# Patient Record
Sex: Female | Born: 1935
Health system: Southern US, Community
[De-identification: ages and names within clinical notes are randomized; demographics above are authoritative.]

## PROBLEM LIST (undated history)

## (undated) DIAGNOSIS — I4891 Unspecified atrial fibrillation: Secondary | ICD-10-CM

## (undated) DIAGNOSIS — Z9889 Other specified postprocedural states: Secondary | ICD-10-CM

## (undated) DIAGNOSIS — N289 Disorder of kidney and ureter, unspecified: Secondary | ICD-10-CM

## (undated) DIAGNOSIS — N183 Chronic kidney disease, stage 3 unspecified: Secondary | ICD-10-CM

## (undated) DIAGNOSIS — I10 Essential (primary) hypertension: Secondary | ICD-10-CM

## (undated) DIAGNOSIS — D649 Anemia, unspecified: Secondary | ICD-10-CM

## (undated) DIAGNOSIS — E039 Hypothyroidism, unspecified: Secondary | ICD-10-CM

## (undated) DIAGNOSIS — R911 Solitary pulmonary nodule: Secondary | ICD-10-CM

## (undated) DIAGNOSIS — J189 Pneumonia, unspecified organism: Secondary | ICD-10-CM

## (undated) DIAGNOSIS — R42 Dizziness and giddiness: Secondary | ICD-10-CM

## (undated) DIAGNOSIS — I701 Atherosclerosis of renal artery: Secondary | ICD-10-CM

## (undated) DIAGNOSIS — R011 Cardiac murmur, unspecified: Secondary | ICD-10-CM

## (undated) DIAGNOSIS — R112 Nausea with vomiting, unspecified: Secondary | ICD-10-CM

## (undated) DIAGNOSIS — E785 Hyperlipidemia, unspecified: Secondary | ICD-10-CM

## (undated) DIAGNOSIS — J45909 Unspecified asthma, uncomplicated: Secondary | ICD-10-CM

## (undated) DIAGNOSIS — N2889 Other specified disorders of kidney and ureter: Secondary | ICD-10-CM

## (undated) DIAGNOSIS — M1990 Unspecified osteoarthritis, unspecified site: Secondary | ICD-10-CM

## (undated) DIAGNOSIS — J449 Chronic obstructive pulmonary disease, unspecified: Secondary | ICD-10-CM

## (undated) HISTORY — DX: Unspecified osteoarthritis, unspecified site: M19.90

## (undated) HISTORY — DX: Essential (primary) hypertension: I10

## (undated) HISTORY — DX: Unspecified asthma, uncomplicated: J45.909

## (undated) HISTORY — DX: Dizziness and giddiness: R42

## (undated) HISTORY — DX: Chronic kidney disease, stage 3 (moderate): N18.3

## (undated) HISTORY — DX: Disorder of kidney and ureter, unspecified: N28.9

## (undated) HISTORY — DX: Chronic kidney disease, stage 3 unspecified: N18.30

## (undated) HISTORY — DX: Hyperlipidemia, unspecified: E78.5

## (undated) HISTORY — DX: Other specified disorders of kidney and ureter: N28.89

## (undated) HISTORY — DX: Solitary pulmonary nodule: R91.1

## (undated) HISTORY — DX: Anemia, unspecified: D64.9

## (undated) HISTORY — DX: Hypothyroidism, unspecified: E03.9

## (undated) HISTORY — PX: KNEE ARTHROSCOPY: SUR90

---

## 1979-08-19 HISTORY — PX: THYROIDECTOMY, PARTIAL: SHX18

## 1998-05-31 ENCOUNTER — Other Ambulatory Visit: Admission: RE | Admit: 1998-05-31 | Discharge: 1998-05-31 | Payer: Self-pay | Admitting: Obstetrics and Gynecology

## 1999-07-17 ENCOUNTER — Other Ambulatory Visit: Admission: RE | Admit: 1999-07-17 | Discharge: 1999-07-17 | Payer: Self-pay | Admitting: Obstetrics and Gynecology

## 2000-06-24 ENCOUNTER — Other Ambulatory Visit: Admission: RE | Admit: 2000-06-24 | Discharge: 2000-06-24 | Payer: Self-pay | Admitting: Obstetrics and Gynecology

## 2001-06-04 ENCOUNTER — Emergency Department (HOSPITAL_COMMUNITY): Admission: EM | Admit: 2001-06-04 | Discharge: 2001-06-04 | Payer: Self-pay

## 2001-07-13 ENCOUNTER — Other Ambulatory Visit: Admission: RE | Admit: 2001-07-13 | Discharge: 2001-07-13 | Payer: Self-pay | Admitting: Obstetrics and Gynecology

## 2004-03-13 ENCOUNTER — Ambulatory Visit (HOSPITAL_COMMUNITY): Admission: RE | Admit: 2004-03-13 | Discharge: 2004-03-13 | Payer: Self-pay | Admitting: Gastroenterology

## 2005-12-10 ENCOUNTER — Ambulatory Visit: Payer: Self-pay | Admitting: Cardiology

## 2005-12-26 ENCOUNTER — Encounter: Payer: Self-pay | Admitting: Cardiovascular Disease

## 2005-12-26 ENCOUNTER — Ambulatory Visit: Payer: Self-pay

## 2006-01-01 ENCOUNTER — Ambulatory Visit: Payer: Self-pay | Admitting: Cardiology

## 2007-06-04 ENCOUNTER — Ambulatory Visit: Payer: Self-pay | Admitting: Internal Medicine

## 2007-06-04 ENCOUNTER — Inpatient Hospital Stay (HOSPITAL_COMMUNITY): Admission: EM | Admit: 2007-06-04 | Discharge: 2007-06-05 | Payer: Self-pay | Admitting: Emergency Medicine

## 2007-07-21 ENCOUNTER — Ambulatory Visit: Payer: Self-pay | Admitting: Cardiology

## 2007-08-23 ENCOUNTER — Ambulatory Visit: Payer: Self-pay

## 2007-08-31 ENCOUNTER — Ambulatory Visit: Payer: Self-pay | Admitting: Cardiovascular Disease

## 2007-11-27 ENCOUNTER — Inpatient Hospital Stay (HOSPITAL_COMMUNITY): Admission: EM | Admit: 2007-11-27 | Discharge: 2007-12-02 | Payer: Self-pay | Admitting: Emergency Medicine

## 2007-11-27 ENCOUNTER — Ambulatory Visit: Payer: Self-pay | Admitting: Cardiology

## 2007-11-29 ENCOUNTER — Encounter (INDEPENDENT_AMBULATORY_CARE_PROVIDER_SITE_OTHER): Payer: Self-pay | Admitting: *Deleted

## 2008-09-27 ENCOUNTER — Ambulatory Visit: Payer: Self-pay | Admitting: Cardiology

## 2008-09-28 DIAGNOSIS — C4491 Basal cell carcinoma of skin, unspecified: Secondary | ICD-10-CM

## 2008-09-28 HISTORY — DX: Basal cell carcinoma of skin, unspecified: C44.91

## 2009-07-30 ENCOUNTER — Encounter: Admission: RE | Admit: 2009-07-30 | Discharge: 2009-08-16 | Payer: Self-pay | Admitting: Podiatry

## 2009-08-20 ENCOUNTER — Encounter: Admission: RE | Admit: 2009-08-20 | Discharge: 2009-11-18 | Payer: Self-pay | Admitting: Podiatry

## 2009-11-29 ENCOUNTER — Encounter: Payer: Self-pay | Admitting: Cardiology

## 2009-12-18 DIAGNOSIS — M129 Arthropathy, unspecified: Secondary | ICD-10-CM | POA: Insufficient documentation

## 2009-12-18 DIAGNOSIS — E782 Mixed hyperlipidemia: Secondary | ICD-10-CM | POA: Insufficient documentation

## 2009-12-18 DIAGNOSIS — N189 Chronic kidney disease, unspecified: Secondary | ICD-10-CM | POA: Insufficient documentation

## 2009-12-18 DIAGNOSIS — E039 Hypothyroidism, unspecified: Secondary | ICD-10-CM | POA: Insufficient documentation

## 2009-12-18 DIAGNOSIS — I10 Essential (primary) hypertension: Secondary | ICD-10-CM

## 2009-12-18 DIAGNOSIS — E785 Hyperlipidemia, unspecified: Secondary | ICD-10-CM

## 2009-12-19 ENCOUNTER — Ambulatory Visit: Payer: Self-pay | Admitting: Cardiology

## 2009-12-19 DIAGNOSIS — R079 Chest pain, unspecified: Secondary | ICD-10-CM

## 2010-01-23 ENCOUNTER — Ambulatory Visit: Payer: Self-pay | Admitting: Cardiology

## 2010-01-23 DIAGNOSIS — R9431 Abnormal electrocardiogram [ECG] [EKG]: Secondary | ICD-10-CM

## 2010-01-25 ENCOUNTER — Ambulatory Visit: Payer: Self-pay | Admitting: Cardiology

## 2010-01-25 ENCOUNTER — Inpatient Hospital Stay (HOSPITAL_BASED_OUTPATIENT_CLINIC_OR_DEPARTMENT_OTHER): Admission: RE | Admit: 2010-01-25 | Discharge: 2010-01-25 | Payer: Self-pay | Admitting: Cardiology

## 2010-02-01 ENCOUNTER — Encounter: Payer: Self-pay | Admitting: Cardiology

## 2010-02-19 ENCOUNTER — Encounter: Payer: Self-pay | Admitting: Cardiology

## 2010-02-20 ENCOUNTER — Ambulatory Visit: Payer: Self-pay | Admitting: Cardiology

## 2010-09-08 ENCOUNTER — Encounter: Payer: Self-pay | Admitting: Cardiology

## 2010-09-17 NOTE — Letter (Signed)
Summary: Cardiac Catheterization Instructions- [facility]  [facility] at [facility]. [place], [place] [address]   Phone: [phone]  Fax: [phone]     01/23/2010 MRN: [id number]  [name] [place],   [address]  [name],   You are scheduled for a Cardiac Catheterization on Friday, June 10th with Dr. Percival Spanish Please arrive to the 1st floor of the Heart and Vascular Center at [facility] at 6:30 am on the day of your procedure. Please do not arrive before 6:30 a.m. Call the Heart and Vascular Center at [phone] if you are unable to make your appointmnet. The Code to get into the parking garage under the building is 0100. Take the elevators to the 1st floor. You must have someone to drive you home. Someone must be with you for the first 24 hours after you arrive home. Please wear clothes that are easy to get on and off and wear slip-on shoes. Do not eat or drink after midnight except water with your medications that morning. Bring all your medications and current insurance cards with you.  ___ DO NOT take these medications before your procedure: ________________________________________________________________  ___ Make sure you take your aspirin.  ___ You may take ALL of your medications with water that morning.     The usual length of stay after your procedure is 2 to 3 hours. This can vary.  If you have any questions, please call the office at the number listed above.   Sim Boast, RN

## 2010-09-17 NOTE — Assessment & Plan Note (Signed)
Summary: [facility]      Allergies Added:   Visit Type:  Follow-up Primary Provider:  Haynes Hoehn, NP  CC:  chest pain.  History of Present Illness: The patient returns after recent cardiac catheterization. This was done to evaluate chest pain and an abnormal stress test. However, she had normal coronaries on catheter. Since that time she has had no new problems. She gets the discomfort only when she ambulates up a hill. It goes away when she stops. She has had no new resting symptoms. She denies any excessive shortness of breath, PND or orthopnea. She has had no palpitations, presyncope or syncope. She had no problems with her right femoral access site for her catheterization.  Current Medications (verified): 1)  Atenolol-Chlorthalidone 50-25 Mg Tabs (Atenolol-Chlorthalidone) .Marland Kitchen.. 1 Po Daily 2)  Lipitor 10 Mg Tabs (Atorvastatin Calcium) .Marland Kitchen.. 1 By Mouth Daily 3)  Lisinopril 5 Mg Tabs (Lisinopril) .Marland Kitchen.. 1 By Mouth Daily 4)  Levothroid 75 Mcg Tabs (Levothyroxine Sodium) .Marland Kitchen.. 1 By Mouth Daily 5)  Oscal 500/200 D-3 500-200 Mg-Unit Tabs (Calcium-Vitamin D) .Marland Kitchen.. 1 By Mouth Daily 6)  Omega-3 350 Mg Caps (Omega-3 Fatty Acids) .Marland Kitchen.. 1 By Mouth Daily 7)  Aspirin 81 Mg  Tabs (Aspirin) .Marland Kitchen.. 1 By Mouth Daily  Allergies (verified): 1)  ! Sulfa  Past History:  Past Medical History: Reviewed history from 12/19/2009 and no changes required.  1. Right upper lobe nodule.   2. Hypertension.   3. Hyperlipidemia.   4. Hypothyroidism.   5. Chronic renal insufficiency.   6. Arthritis   Past Surgical History: Reviewed history from 12/18/2009 and no changes required.  Partial thyroidectomy  Review of Systems       As stated in the HPI and negative for all other systems.   Vital Signs:  Patient profile:   77 year old female Height:      66 inches Weight:      14918 pounds BMI:     2416.53 Pulse rate:   60 / minute Resp:     18 per minute BP sitting:   148 / 74  (right arm)  Vitals  Entered By: Levora Angel, CNA (February 20, 2010 3:24 PM)  Physical Exam  General:  Well developed, well nourished, in no acute distress. Head:  normocephalic and atraumatic Eyes:  PERRLA/EOM intact; conjunctiva and lids normal. Mouth:  Teeth, gums and palate normal. Oral mucosa normal. Neck:  Neck supple, no JVD. No masses, thyromegaly or abnormal cervical nodes. Chest Wall:  no deformities or breast masses noted Lungs:  Clear bilaterally to auscultation and percussion. Abdomen:  Bowel sounds positive; abdomen soft and non-tender without masses, organomegaly, or hernias noted. No hepatosplenomegaly. Msk:  Back normal, normal gait. Muscle strength and tone normal. Extremities:  No clubbing or cyanosis. Neurologic:  Alert and oriented x 3. Skin:  Intact without lesions or rashes. Psych:  Normal affect.   Detailed Cardiovascular Exam  Neck    Carotids: Carotids full and equal bilaterally without bruits.      Neck Veins: Normal, no JVD.    Heart    Inspection: no deformities or lifts noted.      Palpation: normal PMI with no thrills palpable.      Auscultation: regular rate and rhythm, S1, S2 without murmurs, rubs, gallops, or clicks.    Vascular    Abdominal Aorta: no palpable masses, pulsations, or audible bruits.      Femoral Pulses: normal femoral pulses bilaterally.  Pedal Pulses: normal pedal pulses bilaterally.      Radial Pulses: normal radial pulses bilaterally.      Peripheral Circulation: no clubbing, cyanosis, or edema noted with normal capillary refill.     EKG  Procedure date:  02/20/2010  Findings:      Sinus rhythm, rate 60, axis within normal limits, intervals within normal limits, sinus arrhythmia  Impression & Recommendations:  Problem # 1:  CHEST PAIN (ICD-786.50) There is no obvious cardiac etiology to her complaints. No further cardiac testing is suggested. If this persists perhaps evaluation for exercise-induced asthma might be prudent.  Problem #  2:  RENAL INSUFFICIENCY, CHRONIC (ICD-585.9) We followed up her creatinine post procedure and it remained stable.  Problem # 3:  HYPERTENSION (ICD-401.9) Her blood pressure is very slightly elevated here. However, she says it is normal at home and I will make no changes.

## 2010-09-17 NOTE — Miscellaneous (Signed)
  Clinical Lists Changes  Observations: Added new observation of CARDCATHFIND: 1. Hemodynamics:  LV 186/22, AO 187/123. 2. Coronaries:  The left main was normal.  The LAD was normal.  There     was a small first diagonal which was normal.  The circumflex in the     AV groove was normal.  Ramus intermediate was large and normal.     The right coronary artery was dominant and normal.  The PDA was     moderate sized and normal. 3. Left ventriculogram:  The left ventricle was not injected secondary     to renal insufficiency, though I did cross for pressures.   CONCLUSION:  Normal coronary arteries.  False positive exercise treadmill test.   PLAN:  No further cardiac workup is suggested.  She will continue with primary risk reduction.         Minus Breeding, MD, [facility]       (01/25/2010 9:54)      Cardiac Cath  Procedure date:  01/25/2010  Findings:      1. Hemodynamics:  LV 186/22, AO 187/123. 2. Coronaries:  The left main was normal.  The LAD was normal.  There     was a small first diagonal which was normal.  The circumflex in the     AV groove was normal.  Ramus intermediate was large and normal.     The right coronary artery was dominant and normal.  The PDA was     moderate sized and normal. 3. Left ventriculogram:  The left ventricle was not injected secondary     to renal insufficiency, though I did cross for pressures.   CONCLUSION:  Normal coronary arteries.  False positive exercise treadmill test.   PLAN:  No further cardiac workup is suggested.  She will continue with primary risk reduction.         Minus Breeding, MD, [facility]

## 2010-09-17 NOTE — Assessment & Plan Note (Signed)
Summary: [facility]  Medications Added ASPIRIN 81 MG  TABS (ASPIRIN) 1 by mouth daily      Allergies Added:   Visit Type:  Follow-up Primary Provider:  Haynes Hoehn, NP  CC:  chest pain.  History of Present Illness: The patient presents for followup of an abnormal stress test. I saw her a few weeks ago for evaluation of chest discomfort. This wasn't exertional discomfort. Because of was a stable pattern I decided to send her for an exercise treadmill test. The patient had this study but was not able to achieve a target heart rate. She got to a heart rate of 112 with a target of 124. She had to stop because she became fatigued and went 5 minutes and 15 seconds. Of note she did not hold her beta blocker prior to this test. She says she did not go for now to bring on her usual exertional discomfort. However, the stress test clearly demonstrated ST segment depression of 2 mm in leads 23 and aVF at peak exercise. She came back to discuss this. Since that study she has had no new symptoms. In particular she has had no resting symptoms such as chest pressure, neck or arm discomfort. She is not describing palpitations, presyncope or syncope. She is not describing PND or orthopnea.  Current Medications (verified): 1)  Atenolol-Chlorthalidone 50-25 Mg Tabs (Atenolol-Chlorthalidone) .Marland Kitchen.. 1 Po Daily 2)  Lipitor 10 Mg Tabs (Atorvastatin Calcium) .Marland Kitchen.. 1 By Mouth Daily 3)  Lisinopril 5 Mg Tabs (Lisinopril) .Marland Kitchen.. 1 By Mouth Daily 4)  Levothroid 75 Mcg Tabs (Levothyroxine Sodium) .Marland Kitchen.. 1 By Mouth Daily 5)  Oscal 500/200 D-3 500-200 Mg-Unit Tabs (Calcium-Vitamin D) .Marland Kitchen.. 1 By Mouth Daily 6)  Omega-3 350 Mg Caps (Omega-3 Fatty Acids) .Marland Kitchen.. 1 By Mouth Daily 7)  Aspirin 81 Mg  Tabs (Aspirin) .Marland Kitchen.. 1 By Mouth Daily  Allergies (verified): 1)  ! Sulfa  Past History:  Past Medical History: Reviewed history from 12/19/2009 and no changes required.  1. Right upper lobe nodule.   2. Hypertension.   3.  Hyperlipidemia.   4. Hypothyroidism.   5. Chronic renal insufficiency.   6. Arthritis   Past Surgical History: Reviewed history from 12/18/2009 and no changes required.  Partial thyroidectomy  Family History: Reviewed history from 12/19/2009 and no changes required. Mother died age 13.  She has history of thyroid disease.   Father had a CVA.   Social History: Reviewed history from 12/19/2009 and no changes required. Cigarettes the patient denies, alcohol the patient denies.  Review of Systems       As stated in the HPI and negative for all other systems.   Vital Signs:  Patient profile:   77 year old female Height:      66 inches Weight:      150 pounds BMI:     24.30 Pulse rate:   55 / minute Resp:     18 per minute BP sitting:   168 / 88  (right arm)  Vitals Entered By: Levora Angel, CNA (January 23, 2010 11:23 AM)  Physical Exam  General:  Well developed, well nourished, in no acute distress. Head:  normocephalic and atraumatic Eyes:  PERRLA/EOM intact; conjunctiva and lids normal. Mouth:  Teeth, gums and palate normal. Oral mucosa normal. Neck:  Neck supple, no JVD. No masses, thyromegaly or abnormal cervical nodes. Chest Wall:  no deformities or breast masses noted Lungs:  Clear bilaterally to auscultation and percussion. Abdomen:  Bowel sounds positive;  abdomen soft and non-tender without masses, organomegaly, or hernias noted. No hepatosplenomegaly. Msk:  Back normal, normal gait. Muscle strength and tone normal. Extremities:  No clubbing or cyanosis. Neurologic:  Alert and oriented x 3. Skin:  Intact without lesions or rashes. Cervical Nodes:  no significant adenopathy Inguinal Nodes:  no significant adenopathy Psych:  Normal affect.   Detailed Cardiovascular Exam  Neck    Carotids: Carotids full and equal bilaterally without bruits.      Neck Veins: Normal, no JVD.    Heart    Inspection: no deformities or lifts noted.      Palpation: normal PMI with  no thrills palpable.      Auscultation: regular rate and rhythm, S1, S2 without murmurs, rubs, gallops, or clicks.    Vascular    Abdominal Aorta: no palpable masses, pulsations, or audible bruits.      Femoral Pulses: normal femoral pulses bilaterally.      Pedal Pulses: normal pedal pulses bilaterally.      Radial Pulses: normal radial pulses bilaterally.      Peripheral Circulation: no clubbing, cyanosis, or edema noted with normal capillary refill.     Impression & Recommendations:  Problem # 1:  ABNORMAL STRESS ELECTROCARDIOGRAM (ICD-794.31) Patient now has an abnormal stress test with symptoms as described. The post test probability of obstructive coronary disease is high. Therefore, cardiac catheterization is indicated. I discussed at length the risks and benefits to include stroke heart attack and death as well as well as other possibilities. She understands and agrees to proceed.  Problem # 2:  RENAL INSUFFICIENCY, CHRONIC (ICD-585.9) Her last creatinine was 1.3 in April which is a creatinine clearance of about 45. She will hold her lisinopril for 2 days and orally hydrate. She'll come in for the study in the early morning and be hydrated for a late morning study. I will minimize contrast.  Problem # 3:  HYPERLIPIDEMIA (ICD-272.4) This is followed closely by her primary physicians. Further recommendations will be based on the presence or absence of coronary disease.  Patient Instructions: 1)  Your physician recommends that you schedule a follow-up appointment after cath in [place] office 2)  Your physician recommends that you have  lab work TODAY 3)  Your physician recommends that you continue on your current medications as directed. Please refer to the Current Medication list given to you today.

## 2010-09-17 NOTE — Assessment & Plan Note (Signed)
Summary: [facility]  Medications Added ATENOLOL-CHLORTHALIDONE 50-25 MG TABS (ATENOLOL-CHLORTHALIDONE) 1 po daily LIPITOR 10 MG TABS (ATORVASTATIN CALCIUM) 1 by mouth daily LISINOPRIL 5 MG TABS (LISINOPRIL) 1 by mouth daily LEVOTHROID 75 MCG TABS (LEVOTHYROXINE SODIUM) 1 by mouth daily OSCAL 500/200 D-3 500-200 MG-UNIT TABS (CALCIUM-VITAMIN D) 1 by mouth daily OMEGA-3 350 MG CAPS (OMEGA-3 FATTY ACIDS) 1 by mouth daily      Allergies Added: ! SULFA    Visit Type:  Follow-up Primary Provider:  Haynes Hoehn, NP  CC:  Dyspnea.  History of Present Illness: The patient presents for evaluation of dyspnea and chest discomfort. She was evaluated for this in 2007 and hospitalized in 2008. In 2007 she had a stress perfusion study which demonstrated a normal ejection fraction and no evidence of ischemia or infarct. No further evaluation was performed following this. Because of continued complaints as well as risk factors she was referred for further evaluation. She says similar 2007 she does get some shortness of breath climbing an incline and may also get some chest tightness. She stops what she is doing rests for a minute and her symptoms quickly go away. She says these have been stable and unchanged over the years. She has no resting chest pain and denies any neck or arm discomfort. She has no palpitations, presyncope or syncope. She has no PND or orthopnea.  Current Medications (verified): 1)  Atenolol-Chlorthalidone 50-25 Mg Tabs (Atenolol-Chlorthalidone) .Marland Kitchen.. 1 Po Daily 2)  Lipitor 10 Mg Tabs (Atorvastatin Calcium) .Marland Kitchen.. 1 By Mouth Daily 3)  Lisinopril 5 Mg Tabs (Lisinopril) .Marland Kitchen.. 1 By Mouth Daily 4)  Levothroid 75 Mcg Tabs (Levothyroxine Sodium) .Marland Kitchen.. 1 By Mouth Daily 5)  Oscal 500/200 D-3 500-200 Mg-Unit Tabs (Calcium-Vitamin D) .Marland Kitchen.. 1 By Mouth Daily 6)  Omega-3 350 Mg Caps (Omega-3 Fatty Acids) .Marland Kitchen.. 1 By Mouth Daily  Allergies (verified): 1)  ! Sulfa  Past History:  Past Medical  History:  1. Right upper lobe nodule.   2. Hypertension.   3. Hyperlipidemia.   4. Hypothyroidism.   5. Chronic renal insufficiency.   6. Arthritis   Family History: Mother died age 76.  She has history of thyroid disease.   Father had a CVA.   Social History: Reviewed history from 12/18/2009 and no changes required. Cigarettes the patient denies, alcohol the patient denies.  Review of Systems       Recent plantar fasciitis. Otherwise as stated in the history of present illness negative for all other systems.   Vital Signs:  Patient profile:   77 year old female Height:      66 inches Weight:      150 pounds BMI:     24.30 Pulse rate:   52 / minute Resp:     16 per minute BP sitting:   138 / 82  (right arm)  Vitals Entered By: Levora Angel, CNA (Dec 19, 2009 11:51 AM)  Physical Exam  General:  Well developed, well nourished, in no acute distress. Head:  normocephalic and atraumatic Eyes:  PERRLA/EOM intact; conjunctiva and lids normal. Mouth:  Teeth, gums and palate normal. Oral mucosa normal. Neck:  Neck supple, no JVD. No masses, thyromegaly or abnormal cervical nodes. Chest Wall:  no deformities or breast masses noted Lungs:  Clear bilaterally to auscultation and percussion. Abdomen:  Bowel sounds positive; abdomen soft and non-tender without masses, organomegaly, or hernias noted. No hepatosplenomegaly. Msk:  Back normal, normal gait. Muscle strength and tone normal. Extremities:  No clubbing  or cyanosis. Neurologic:  Alert and oriented x 3. Skin:  Intact without lesions or rashes. Cervical Nodes:  no significant adenopathy Axillary Nodes:  no significant adenopathy Inguinal Nodes:  no significant adenopathy Psych:  Normal affect.   Detailed Cardiovascular Exam  Neck    Carotids: Carotids full and equal bilaterally without bruits.      Neck Veins: Normal, no JVD.    Heart    Inspection: no deformities or lifts noted.      Palpation: normal PMI with no  thrills palpable.      Auscultation: regular rate and rhythm, S1, S2 without murmurs, rubs, gallops, or clicks.    Vascular    Abdominal Aorta: no palpable masses, pulsations, or audible bruits.      Femoral Pulses: normal femoral pulses bilaterally.      Pedal Pulses: normal pedal pulses bilaterally.      Radial Pulses: normal radial pulses bilaterally.      Peripheral Circulation: no clubbing, cyanosis, or edema noted with normal capillary refill.     EKG  Procedure date:  11/29/2009  Findings:      sinus rhythm, rate 88, axis within normal limits, intervals within normal limits, early repolarization changes  Impression & Recommendations:  Problem # 1:  CHEST PAIN (ICD-786.50) The patient has no change in her symptoms. I did review extensively her previous workup. She does have continued risk factors however. I think it would be reasonable to have her do an exercise treadmill test which she can get at her primary care office. If this is normal no further testing is suggested.  Problem # 2:  HYPERTENSION (ICD-401.9) Her blood pressure is elevated today but I reviewed previous readings and it has been well controlled particularly at home with her home cuff.  No change in therapy is planned.  Problem # 3:  HYPERLIPIDEMIA (ICD-272.4) I reviewed her most recent lipids. Her triglycerides were slightly elevated. Otherwise she is at target and I would make no change her regimen.  Patient Instructions: 1)  Your physician recommends that you schedule a follow-up appointment as needed 2)  Your physician recommends that you continue on your current medications as directed. Please refer to the Current Medication list given to you today.

## 2010-09-17 NOTE — Cardiovascular Report (Signed)
Summary: Pre-Cath Orders  Pre-Cath Orders   Imported By: Marilynne Drivers 02/13/2010 14:21:14  _____________________________________________________________________  External Attachment:    Type:   Image     Comment:   External Document

## 2010-12-31 NOTE — Discharge Summary (Signed)
NAMEJAZARI, [name]                ACCOUNT NO.:  [id number]   MEDICAL RECORD NO.:  [id number]          PATIENT TYPE:  INP   LOCATION:  4707                         FACILITY:  [facility]   PHYSICIAN:  Wallis Bamberg. Johnsie Cancel, MD, FACCDATE OF BIRTH:  [birth year]   DATE OF ADMISSION:  06/04/2007  DATE OF DISCHARGE:  06/05/2007                               DISCHARGE SUMMARY   CARDIOLOGIST:  She will be new to Dr. Minus Breeding.   PRIMARY CARE PHYSICIAN:  Dr. Redge Gainer in [place] at [facility].   REASON FOR ADMISSION:  Chest pain.   DISCHARGE DIAGNOSES:  1. Atypical chest pain, etiology unclear.      a.     Suspect musculoskeletal etiology.  2. Right upper lobe nodule.      a.     Needs follow up CT in 3 months.  3. Hypertension.  4. Hyperlipidemia.  5. Hypothyroidism.  6. History of negative stress test in April of 2007.  7. History of partial thyroidectomy.  8. Mild chronic renal insufficiency.      a.     Glomerular filtration rate 41 mL a minute.   PROCEDURE PERFORMED THIS ADMISSION:  None.   HISTORY:  [name] is a 77 year old female patient who presented to  the emergency room with substernal chest pain radiating through to her  back.  This was described as a sharp ache, worse with movement, lasting  about 3 hours.  It increased with inspiration.  Her initial EKG showed J-  point elevation in V2-4.  Her initial cardiac markers were normal.  Her  chest x-ray showed an air-fluid level in the right middle lobe.  Her  blood pressure was elevated at 186/103, and she was treated with p.o.  clonidine.  She was admitted for further evaluation and treatment.   HOSPITAL COURSE:  The patient ruled out for myocardial infarction by  enzymes.  She did undergo a chest CT to further evaluate her abnormal  chest x-ray.  This initially showed no right middle lobe nodule, but a  possible 4 mm right upper lobe nodule versus pulmonary AVM.  Initially,  a CT angiogram was  recommended by radiology.  However, upon further  review, it was decided by radiology that this was probably not an AVM  but a nodule.  Radiology recommended a repeat CT in 3 months.  On the  morning of June 05, 2007, Dr. Johnsie Cancel saw the patient and felt she was  stable enough for discharge to home.  She will need follow up with Dr.  Percival Spanish in [place] in the next 8 to 12 weeks, and a follow up chest CT  done in 3 months.   LABS AND INSULARLY DATA:  White count 5500, hemoglobin 13.3, hematocrit  38.6, platelet count 227,000.  INR 1.  D-dimer 0.37.  Sodium 140,  potassium 3.4, glucose 106, BUN 15, creatinine 1.28.  Cardiac markers  negative x3.  Total cholesterol 165, triglycerides 271, HDL 36, LDL 75.  TSH 2.272.  Chest x-ray as noted above.  Chest CT as noted above.   DISCHARGE  MEDICATIONS:  1. Lipitor 10 mg daily.  2. Levothyroxine 0.075 mg daily.  3. Calcium 2 tablets daily.  4. Lisinopril 5 mg daily.      a.     This medication was increased this admission secondary to       hypertension.  5. Atenolol/HCTZ 25/12.5 mg daily.  6. Fish oil as taken previously.  7. Omega-3 fatty acid as taken previously.   DIET:  Low fat, low sodium diet.   ACTIVITY:  She is to increase her activity slowly.  Wound care is not  applicable.   FOLLOWUP:  1. She is to see Dr. Laurance Flatten next week.  She should call for an      appointment.  2. She should follow up with Dr. Percival Spanish in [place] in the next 8 to      12 weeks.  The office will contact her with an appointment.  She      will be set up for a repeat, noncontrast chest CT to further      evaluate her right upper lobe nodule.   Total physician and PA time greater than 30 minutes on this discharge.      Richardson Dopp, PA-C      Peter C. Johnsie Cancel, MD, [facility]  Electronically Signed    SW/MEDQ  D:  06/05/2007  T:  06/06/2007  Job:  [id number]   cc:   Chipper Herb, M.D.

## 2010-12-31 NOTE — H&P (Signed)
[name], [name] NO.:  [id number]   MEDICAL RECORD NO.:  [id number]          PATIENT TYPE:  INP   LOCATION:  4707                         FACILITY:  [facility]   PHYSICIAN:  Fay Records, MD, FACCDATE OF BIRTH:  1934/03/28   DATE OF ADMISSION:  06/04/2007  DATE OF DISCHARGE:  06/05/2007                              HISTORY & PHYSICAL   PRIMARY CARE PHYSICIAN:  Chipper Herb, M.D.   PRIMARY CARDIOLOGIST:  Satira Sark, MD   HISTORY OF PRESENT ILLNESS:  This is a 77 year old Caucasian female with  no prior cardiac history with a history of hypertension,  hypercholesterolemia and hypothyroidism with sudden onset of chest pain  which she described as substernal radiating bilaterally through to the  back, sharp and achy.  She thought it was a muscle spasm.  It was worse  with movement and worse with deep breath.  It lasted about 3 hours and  then only pain located in the left scapular area thereafter.  This  occurred while she was out shopping with no associated shortness of  breath, diaphoresis or dizziness.  The patient went home and used a  muscle massager on her left scapular area and a heating pad.  When she  awoke this morning, she was some better but did have a restless night as  each time she moved or turned she felt the pain in her left shoulder.  The patient still has some left scapular pain, although it has lessened  and does increase with movement.  Her husband brought her to her family  care physician, and after the symptoms were explained the patient was  sent to the emergency room.  Of note, the patient does have what she  describes as white coat syndrome, and her blood pressure is elevated  when she comes to a physician's office or to the hospital.  The  patient's admission blood pressure was 186/95.  The patient also states  that during the time at home prior to a subsequent doctor's appointment  that she knows is planned, she will take her  blood pressure and it is  slightly elevated at home in the 99991111 systolic.   REVIEW OF SYSTEMS:  Positive for chest discomfort with pain with  inspiration, also left scapular pain with movement.  Arthralgia-type  pain in her hands.  She denies any nausea, vomiting, diarrhea, blurred  vision or near-syncope.   PAST MEDICAL HISTORY:  1. Hypertension.  2. Hypercholesterolemia.  3. Hypothyroidism.  4. Arthritis.   PAST CARDIAC WORKUP:  She did have a Cardiolite stress test in April of  2000 revealing no evidence of ischemia.  She also had a normal EF for  echocardiogram at that time.   PAST SURGICAL HISTORY:  Partial thyroid removal.   SOCIAL HISTORY:  She lives in [place] with her husband.  She is married  with 2 children.  She does not smoke.  She does not drink alcohol.   FAMILY HISTORY:  Mother at age 80 in good health.  Father died of a CVA.   CURRENT MEDICATIONS AT HOME:  1. Lipitor 10 mg nightly at bedtime.  2. Atenolol/HCTZ 25/12.5 mg once daily.  3. Lisinopril 2.5 mg once daily.  4. Levothyroxine 75 mcg once daily.  5. Fish oil once daily.  6. Omega 3 at 1200 mg once daily.  7. Ultra Os-Cal once daily.   ALLERGIES:  No known drug allergies.  However, she is intolerant to  Sulfa causing severe nausea and vomiting.   CURRENT LABORATORY DATA:  Sodium 139, potassium 3.6, chloride 99, CO2 of  32, BUN 15, creatinine 1.28, glucose 159.  Hemoglobin 13.3, hematocrit  38.6, white blood cells 5.5, platelets 227.  Point of care markers:  CK  67.9, MB less than 1.0, troponin less than 0.05.  PT 12.9, INR 1.0.   EKG revealing sinus bradycardia with J-point changes in V2 through V4  and with an incomplete right bundle branch block.  D-dimer negative at  0.37.   Chest x-ray completed PA and lateral views, reveals air fluid level  within the right middle lobe, likely with an infected bleb.  Normal  heart size.  No acute cardiopulmonary disease otherwise.   PHYSICAL EXAMINATION:   VITAL SIGNS:  Blood pressure 162/75 (after being  given 1.25 mg Vasotec IV x1).  Heart rate 69, respirations 18, O2  saturation 99% on 2 liters.  HEENT:  Head is normocephalic and atraumatic.  Eyes:  PERRLA.  Mucous  membranes in mouth pink and moist.  Tongue is midline.  NECK: Supple without JVD.  No carotid bruits appreciated.  CARDIOVASCULAR:  Regular rate and rhythm with S4 murmur auscultated.  Pulses 2+ and equal bilaterally.  LUNGS:  Clear to auscultation without wheezes, rales or rhonchi.  CHEST:  Chest wall is nontender with palpation.  ABDOMEN:  Soft and nontender with 2+ bowel sounds, mildly obese.  EXTREMITIES:  Without cyanosis, clubbing or edema.  MUSCULOSKELETAL:  Left scapular soreness with movement but not on  palpation.   IMPRESSION:  1. Atypical chest pain.  2. Hypertension not well controlled.  3. Bradycardia.  4. Hypothyroidism.  5. Hypercholesterolemia.   PLAN:  The patient has been seen and examined by me and by Dr. Dorris Carnes in the emergency room with no known history of coronary artery  disease, yesterday developed chest pain to back, atypical in that it is  pleuritic shoulder pain, worse with movement.  There is no chest pain  today.  Shoulder pains have eased off.  No recent decline in energy.  Exam is negative. EKG workup findings negative.  Chest x-ray revealing  air fluid level within the right middle lobe.   1. Will proceed with CT scan to evaluate this further.  2. The patient will be also started on Clonidine 0.1 mg p.o. secondary      to hypertension despite use of medications outside the hospital.   Will make further recommendations based upon response to medical  management and results of CT scan.      Phill Myron. Purcell Nails, NP      Fay Records, MD, [facility]  Electronically Signed    KML/MEDQ  D:  06/04/2007  T:  06/06/2007  Job:  [id number]   cc:   [place]

## 2010-12-31 NOTE — H&P (Signed)
NAME:  [name], [name] NO.:  [id number]   MEDICAL RECORD NO.:  [id number]          PATIENT TYPE:  INP   LOCATION:  [place]                         FACILITY:  [facility]   PHYSICIAN:  Aquilla Hacker, M.D. DATE OF BIRTH:  09-21-1933   DATE OF ADMISSION:  11/27/2007  DATE OF DISCHARGE:                              HISTORY & PHYSICAL   PRIMARY CARE PHYSICIAN:  Chipper Herb, M.D. of [facility].   CHIEF COMPLAINT:  I passed out.   HISTORY OF PRESENT ILLNESS:  Ms. Kamphaus is a 77 year old female who  states that she has been sick all week long.  Her symptoms started  Monday morning shortly after she woke up and ate breakfast.  This was 5  days ago.  She states that she began to vomit and throughout the course  of the day she experienced at least 3-4 additional episodes of vomiting.  She states that her back and head both began to hurt and have been  hurting off and on up until approximately Tuesday or Wednesday of last  week.  She went to see her primary care doctor on Tuesday and saw the  physician's assistant.  At that particular time she was told she was  dehydrated.  She states that over the next couple of days she initially  started to feel better, however, again later she began to feel bad.  On  Thursday she spiked a fever as high as 103.  Appetite has been decreased  throughout the course of the week.  Last night she had a small bout of  diarrhea, however, today her diarrhea has become more pronounced.  She  states that she has been coughing since Tuesday of this week.  Her cough  initially started off as a tickle sensation in the back of her throat  but progressed.  It is described as nonproductive.  She denies having  any difficulties breathing.  This morning she went to her kitchen for a  glass of Gatorade.  Shortly afterwards she passed out.  She was out for  a few minutes.  There was no bowel or urinary incontinence.  She denies  having  any sick contacts at home.  She denies any abdominal pain.  No  shortness of breath.  No chest pain.   PAST MEDICAL HISTORY:  1. Is significant for right upper lobe nodule.  2. Hypertension.  3. Hyperlipidemia.  4. Hypothyroidism.  5. Chronic renal insufficiency.  6. Arthritis in the hands.   SURGERIES:  Partial thyroidectomy.   ALLERGIES:  SULFA causes nausea.   HOME MEDICATIONS:  1. Lisinopril 5 mg once a day.  2. Promethazine 12.5 mg q.6 h p.r.n.  3. Atenolol/chlorthalidone 25/12.5 mg daily.  4. Levothyroxine 75 mcg daily.  5. Lipitor 10 mg p.o. daily.   SOCIAL:  Cigarettes the patient denies, alcohol the patient denies.   FAMILY HISTORY:  Mother is living.  She has history of thyroid disease.  Father had a CVA.   REVIEW OF SYSTEMS:  As per HPI.   PHYSICAL EXAM:  GENERAL:  The patient  is awake, is cooperative.  She  coughs numerous times and she looks weak and ill.  VITAL SIGNS:  Her temperature is 102.0, blood pressure 130/50, heart  rate 87, respirations 20, O2 sat 93% on room air.  HEENT:  Normocephalic, atraumatic.  Anicteric.  Extraocular movements  are intact.  Pupils react to light.  Oral mucosa is pink.  No thrush, no  exudates.  NECK:  Supple.  No JVD, no lymphadenopathy, no thyromegaly.  CARDIAC:  S1-S2 present.  Regular rate and rhythm.  RESPIRATORY:  No crackles or wheezes appreciated.  ABDOMEN:  Flat, soft, nontender, nondistended, positive bowel sounds.  No masses palpated.  EXTREMITIES:  No leg edema.  NEUROLOGICAL:  The patient is alert and oriented x3.  MUSCULOSKELETAL:  5/5 upper and lower extremity strength.   LABS:  BNP 103.  Sodium 129, potassium 2.6, chloride 92, CO2 26, glucose  135, BUN 14, creatinine 1.58, bilirubin total 0.8, alk phos 51, SGOT 57,  SGPT is 40, total protein is 5.9, albumin 2.6, calcium 7.1.  Urinalysis  - RBCs 0-2, white blood cell count 14.2, hemoglobin 11.0, hematocrit  32.6, platelets 145.  Chest x-ray reveals a 6 cm  round opacity in the  right upper lobe which has an appearance concerning for neoplasm.  The  radiologist indicated that after discussion with the ER physician that  this round opacity could represent a pneumonia.  He also indicated that  CT scan may be helpful for further evaluation.  An abdominal x-ray was  also completed.  No intraperitoneal free air, no overt bowel obstruction  was noted.  An EKG reveals an incomplete right bundle branch block.   ASSESSMENT AND PLAN:  1. Pneumonia.  Will start the patient on empiric IV antibiotics.  Will      check blood cultures x2.  The patient currently denies having      sputum production.  Will also check an AFB.  2. Nausea, vomiting.  Will start the patient on p.r.n. antiemetics.  3. Dehydration.  This most likely resulted from the multiple episodes      of nausea and vomiting as well as her decreased oral intake      accompanied by diarrhea.  Will aggressively hydrate the patient for      now.  4. Renal insufficiency.  This appears to be an acute on chronic      process.  There most likely is a prerenal component which would      represent the acute process and it may be secondary to dehydration.      Again, will aggressively hydrate the patient and monitor her BUN      and creatinine closely.  5. Severe hypokalemia.  Will provide the patient with IV potassium      supplementation.  6. Hyponatremia.  Will provide the patient normal saline for now and      monitor sodium closely.  7. Questionable finding on chest x-ray.  Again, a pneumonia is the      most likely underlying process, however, in light of the      radiologist showing concern for neoplasm will order a CAT scan of      the patient's chest after the patient's BUN and creatinine are      closer to normal.  8. Diarrhea.  Will order stool studies.  9. Syncope.  This most likely occurred as a result of severe      dehydration.  In light of questionable findings on EKG  will check       the patient's cardiac markers x3 q.8 h. apart and will also order a      2-D echocardiogram.  10.Mild transaminitis.  The significance of this is questionable.      Will monitor the patient's liver enzymes for now.  11.Hypoalbuminemia.  Mild case of protein calorie malnutrition may or      may not have occurred.  In addition, this may be a reactive      process.  Will monitor the patient's albumin for now.  12.Thrombocytopenia.  Will monitor the patient's platelets for now.  13.Gastrointestinal prophylaxis.  Will provide Protonix.  14.DVT (deep venous thrombosis) prophylaxis.  Will provide Lovenox.      Aquilla Hacker, M.D.  Electronically Signed     OR/MEDQ  D:  11/27/2007  T:  11/27/2007  Job:  [id number]   cc:   Chipper Herb, M.D.

## 2010-12-31 NOTE — Assessment & Plan Note (Signed)
[facility] OFFICE NOTE   [name], [name]                         MRN:          [id number]  DATE:07/21/2007                            DOB:          [birth year]    PRIMARY CARE PHYSICIAN:  Dr. Redge Gainer.   REASON FOR PRESENTATION:  Evaluate patient with chest pain.   HISTORY OF PRESENT ILLNESS:  The patient was recently seen in the ER and  hospitalized overnight. She had chest discomfort. She ruled out for  myocardial infarction. She had a previous echocardiogram and stress  perfusion study in 2007, which were normal, so no further studies were  repeated to evaluate this chest discomfort. It was felt to be non-  anginal. However, there was an old x-ray that suggested a questionable  infected bleb on the right side. A CT was supposed to be ordered to  follow that up and was not done. Therefore, she had a CT in the hospital  which demonstrated a small nodule versus a pulmonary AVM in the superior  segment of the right lower lobe. This was not the previous bleb that was  seen. That seemed to have resolved. However, a CTA was suggested.   She returns today and says that she is having no further chest  discomfort. She has been active. She goes to [employer] sometimes and does  some walking, but not as much when the weather is cold. She denies any  chest discomfort, neck or arm discomfort. She has had no palpitations,  pre-syncope, or syncope. She has had no PND or orthopnea.   PAST MEDICAL HISTORY:  Hypertension, hyperlipidemia, hypothyroidism,  arthritis.   PAST SURGICAL HISTORY:  Partial thyroid removal.   ALLERGIES:  SULFA caused nausea and vomiting.   CURRENT MEDICATIONS:  1. Atenolol 50/25 mg 1/2 daily.  2. Lisinopril 5 mg daily.  3. Os-Cal.  4. Fish oil.  5. Levothyroxine 75 mcg daily.  6. Lipitor 10 mg daily.   REVIEW OF SYSTEMS:  As stated in the HPI and otherwise negative for  other systems.   PHYSICAL EXAMINATION:  GENERAL:  The patient is in no distress.  VITAL SIGNS:  Blood pressure 156/88, heart rate 68 and regular, weight  147 pounds, body mass index 23.  HEENT:  Eyelids unremarkable, pupils equal, round, and reactive to  light, fundi not visualized.  NECK:  No jugular vein distension at 45 degrees, carotid upstroke brisk  and symmetric, no bruits, no thyromegaly.  LYMPHATICS:  No adenopathy.  LUNGS:  Clear to auscultation bilaterally.  BACK:  No costovertebral angle tenderness.  CHEST:  Unremarkable.  HEART:  PMI not displaced or sustained, S1 and S2 within normal limits,  no S3, no S4, no clicks, rubs, or murmurs.  ABDOMEN:  Flat, positive bowel sounds, normal to frequency and pitch, no  bruits, no rebound, no guarding, no midline pulsatile mass, no  organomegaly.  SKIN:  No rashes, no nodules.  EXTREMITIES:  2+ pulses, no edema.   EKG sinus rhythm, rate 68, axis within normal limits, intervals within  normal limits, no acute  ST-T wave changes.   ASSESSMENT AND PLAN:  1. Chest discomfort. There is no evidence of cardiovascular etiology      to this. She is not having these symptoms any more. No further      cardiovascular testing is suggested.  2. Abnormal chest CT. I will go ahead and get a contrasted CT to rule      out an AVM as is suggested.  3. Follow up will be as needed.     Minus Breeding, MD, [facility]  Electronically Signed    JH/MedQ  DD: 07/21/2007  DT: 07/22/2007  Job #: [id number]   cc:   Chipper Herb, M.D.

## 2010-12-31 NOTE — Discharge Summary (Signed)
NAME:  [name], [name] NO.:  [id number]   MEDICAL RECORD NO.:  [id number]          PATIENT TYPE:  INP   LOCATION:  5506                         FACILITY:  [facility]   PHYSICIAN:  Jacquelynn Cree, M.D.   DATE OF BIRTH:  1933-12-27   DATE OF ADMISSION:  11/27/2007  DATE OF DISCHARGE:  12/02/2007                               DISCHARGE SUMMARY   PRIMARY CARE PHYSICIAN:  Chipper Herb, M.D. with [facility].   DISCHARGE DIAGNOSES:  1. Right upper lobe community-acquired pneumonia.  2. Transient nausea, vomiting, and diarrhea with dehydration.  3. Acute renal insufficiency.  4. Hypokalemia.  5. Hyponatremia.  6. Syncope.  7. Thrombocytopenia, resolved.  8. Hypertension.  9. Hypoalbuminemia.   DISCHARGE MEDICATIONS:  1. Lisinopril 5 mg daily.  2. Promethazine12.5 mg q.6 h. p.r.n.  3. Atenolol/ chlorthalidone 25/12.5 mg daily.  4. Levothyroxine 75 mcg daily.  5. Lipitor 10 mg daily.  6. Avelox 400 mg cough.   CONSULTATIONS:  None.   BRIEF ADMISSION HPI:  The patient is a 77 year old female who had not  been feeling well for a week prior to presentation.  She had various  symptoms including fevers high as 103, diminished appetite, diarrhea,  and nausea.  She began to cough and subsequently had a syncopal episode  when attempting to get up to go to the kitchen for Gatorade.  For the  full details, please see the dictated report done by Dr. Quentin Cornwall.   PROCEDURES AND DIAGNOSTIC STUDIES:  1. Chest x-ray on November 27, 2007 showed a 6 cm round opacity in the      right upper lobe.  Round pneumonia was the differential diagnostic      consideration of choice.  Close follow up was recommended to rule      out a smaller more central mass lesion with postobstructive      pneumonitis.  2. Two views of the abdomen on November 27, 2007 showed no      intraperitoneal free air or overt bowel obstruction.  3. CT scan of the head on November 28, 2007 showed  no acute intracranial      abnormality.  There is mild periventricular white matter      hypoattenuation, which was nonspecific but likely reflective of the      sequelae of chronic microvascular ischemia.  4. Chest x-ray on November 29, 2007 showed right upper lobe pneumonia      with increasing air space disease.  5. CT scan of the chest on November 29, 2007 showed posterior segment      right upper lobe pneumonia.  Mediastinal lymphadenopathy, which was      thought to be reactive.  Small right pleural effusion.  Stable left      adrenal nodule.  6. Two-dimensional echocardiogram on November 29, 2007 showed normal left      ventricular systolic function with ejection fraction estimated 55-      65%.  There were no left ventricular regional wall motion      abnormality.  Left ventricular wall thickness was mildly  increased.      There is trivial aortic valvular regurgitation.   DISCHARGE LABORATORY VALUES:  Sodium was 137, potassium 4.1, chloride  101, bicarb 28, BUN 7, creatinine 1.05, and glucose 117.  White blood  cell count was 6.6, hemoglobin 9.9, hematocrit 28.9, and platelets 218.   HOSPITAL COURSE BY PROBLEM:  1. Right upper lobe pneumonia:  The patient was admitted and      empirically put on Rocephin and azithromycin.  She completed 5 days      of IV antibiotics and was subsequently switched to p.o. Avelox.      This time, she has not been febrile and her white blood cell count      has been normal.  She is stable for discharge.  She was put on      Tussionex at discharge for a nonproductive cough.  She is      instructed to follow up with her primary care physician in 1 week      for hospital followup.  2. Acute renal insufficiency secondary to dehydration due to      underlying nausea, vomiting, and diarrhea.  The patient's symptoms      completely resolved with IV fluid rehydration.  3. Hypokalemia:  The patient was appropriately repleted.  4. Hyponatremia:  The patient's  sodium reverted to normal with IV      fluid rehydration.  5. Syncope:  The patient's syncopal event was felt to be secondary to      acute illness with hypotension and dehydration.  Blood pressure is      currently stable.  6. Hypoalbuminemia:  Likely secondary to acute illness.  7. Normocytic anemia:  The patient has a mild normocytic anemia.  She      should follow up with her primary care physician for an outpatient      GI evaluation.   DISPOSITION:  The patient is medically stable and will be discharged  home today.      Jacquelynn Cree, M.D.  Electronically Signed     CR/MEDQ  D:  12/02/2007  T:  12/03/2007  Job:  [id number]   cc:   Chipper Herb, M.D.

## 2011-01-03 NOTE — Op Note (Signed)
[name], [name]                            ACCOUNT NO.:  [id number]   MEDICAL RECORD NO.:  [id number]                   PATIENT TYPE:  AMB   LOCATION:  ENDO                                 FACILITY:  [facility]   PHYSICIAN:  John C. Amedeo Plenty, M.D.                 DATE OF BIRTH:  April 27, 1934   DATE OF PROCEDURE:  DATE OF DISCHARGE:                                 OPERATIVE REPORT   PROCEDURE:  Colonoscopy.   INDICATION FOR PROCEDURE:  Average risk colon cancer screening in a 66-year-  old patient with no prior screening.   DESCRIPTION OF PROCEDURE:  The patient was placed in the left lateral  decubitus position and placed on the pulse monitor with continuous low-flow  oxygen delivered by nasal cannula.  She was sedated with 75 mcg IV fentanyl  and 7 mg IV Versed.  The Olympus video colonoscope was inserted into the  rectum and advanced to the cecum, confirmed by transillumination at  McBurney's point and visualization of the ileocecal valve and appendiceal  orifice.  The prep was excellent.  The cecum, ascending, transverse,  descending, and sigmoid colon all appeared normal with no masses, polyps,  diverticula, or other mucosal abnormalities.  The rectum likewise appeared  normal, and retroflexed view of the anus revealed no obvious internal  hemorrhoids.  The scope was then withdrawn and the patient returned to the  recovery room in stable condition.  She tolerated the procedure well, and  there were no immediate complications.   IMPRESSION:  Normal colonoscopy.   PLAN:  Next colon screening by sigmoidoscopy in 5 years.                                               John C. Amedeo Plenty, M.D.    JCH/MEDQ  D:  03/13/2004  T:  03/13/2004  Job:  [id number]   cc:   Wannetta Sender. Benjamine Mola, M.D.  [address] [place]  [place] [address]  Fax: [phone]

## 2011-03-25 ENCOUNTER — Encounter: Payer: Self-pay | Admitting: Cardiology

## 2011-05-13 LAB — URINALYSIS, ROUTINE W REFLEX MICROSCOPIC
Glucose, UA: NEGATIVE
Leukocytes, UA: NEGATIVE
Protein, ur: 100 — AB
Specific Gravity, Urine: 1.023
pH: 5

## 2011-05-13 LAB — BASIC METABOLIC PANEL
CO2: 27
CO2: 28
Calcium: 7.8 — ABNORMAL LOW
Calcium: 8.1 — ABNORMAL LOW
Calcium: 8.1 — ABNORMAL LOW
Chloride: 99
GFR calc Af Amer: >60
GFR calc Af Amer: >60
GFR calc non Af Amer: 57 — ABNORMAL LOW
Glucose, Bld: 117 — ABNORMAL HIGH
Potassium: 3.9
Sodium: 132 — ABNORMAL LOW
Sodium: 136
Sodium: 137

## 2011-05-13 LAB — CBC
HCT: 28.9 — ABNORMAL LOW
Hemoglobin: 10 — ABNORMAL LOW
Hemoglobin: 10.2 — ABNORMAL LOW
Hemoglobin: 10.3 — ABNORMAL LOW
Hemoglobin: 9.9 — ABNORMAL LOW
MCHC: 33.8
MCHC: 35.2
MCHC: 34.1
MCV: 85.7
MCV: 86.3
Platelets: 145 — ABNORMAL LOW
RBC: 3.39 — ABNORMAL LOW
RBC: 3.4 — ABNORMAL LOW
RBC: 3.53 — ABNORMAL LOW
RBC: 3.78 — ABNORMAL LOW
RDW: 14.4
WBC: 10.9 — ABNORMAL HIGH
WBC: 14.2 — ABNORMAL HIGH
WBC: 5.6

## 2011-05-13 LAB — CK TOTAL AND CKMB (NOT AT ARMC)
CK, MB: 2.5
Relative Index: 0.9
Total CK: 275 — ABNORMAL HIGH

## 2011-05-13 LAB — COMPREHENSIVE METABOLIC PANEL
ALT: 30
ALT: 40 — ABNORMAL HIGH
AST: 35
AST: 57 — ABNORMAL HIGH
Albumin: 2.6 — ABNORMAL LOW
CO2: 28
Calcium: 7.1 — ABNORMAL LOW
Chloride: 92 — ABNORMAL LOW
Chloride: 99
Creatinine, Ser: 1.39 — ABNORMAL HIGH
Creatinine, Ser: 1.58 — ABNORMAL HIGH
GFR calc Af Amer: 39 — ABNORMAL LOW
GFR calc Af Amer: 45 — ABNORMAL LOW
GFR calc non Af Amer: 37 — ABNORMAL LOW
Glucose, Bld: 124 — ABNORMAL HIGH
Sodium: 129 — ABNORMAL LOW
Sodium: 135
Total Bilirubin: 0.6

## 2011-05-13 LAB — INFLUENZA A+B VIRUS AG-DIRECT(RAPID): Inflenza A Ag: NEGATIVE

## 2011-05-13 LAB — DIFFERENTIAL
Eosinophils Absolute: 0
Eosinophils Relative: 0
Lymphocytes Relative: 3 — ABNORMAL LOW
Lymphs Abs: 0.4 — ABNORMAL LOW
Monocytes Absolute: 1.1 — ABNORMAL HIGH

## 2011-05-13 LAB — LEGIONELLA ANTIGEN, URINE: Legionella Antigen, Urine: NEGATIVE

## 2011-05-13 LAB — CULTURE, BLOOD (ROUTINE X 2): Culture: NO GROWTH

## 2011-05-13 LAB — STOOL CULTURE

## 2011-05-13 LAB — URINE MICROSCOPIC-ADD ON

## 2011-05-13 LAB — POCT CARDIAC MARKERS: Troponin i, poc: <0.05

## 2011-05-13 LAB — STREP PNEUMONIAE URINARY ANTIGEN: Strep Pneumo Urinary Antigen: NEGATIVE

## 2011-05-13 LAB — CARDIAC PANEL(CRET KIN+CKTOT+MB+TROPI): Total CK: 271 — ABNORMAL HIGH

## 2011-05-13 LAB — URINE CULTURE

## 2011-05-13 LAB — TROPONIN I: Troponin I: 0.03

## 2011-05-13 LAB — MAGNESIUM: Magnesium: 1.7

## 2011-05-28 LAB — TSH: TSH: 2.272

## 2011-05-28 LAB — APTT: aPTT: 35

## 2011-05-28 LAB — PROTIME-INR: INR: 1

## 2011-05-28 LAB — D-DIMER, QUANTITATIVE: D-Dimer, Quant: 0.37

## 2011-05-28 LAB — DIFFERENTIAL
Basophils Absolute: 0
Lymphocytes Relative: 19
Neutro Abs: 3.8
Neutrophils Relative %: 70

## 2011-05-28 LAB — CARDIAC PANEL(CRET KIN+CKTOT+MB+TROPI)
Relative Index: 1.4
Relative Index: INVALID
Total CK: 105
Total CK: 210 — ABNORMAL HIGH
Total CK: 74

## 2011-05-28 LAB — BASIC METABOLIC PANEL
BUN: 15
CO2: 34 — ABNORMAL HIGH
Calcium: 9
Calcium: 9.1
Creatinine, Ser: 1.28 — ABNORMAL HIGH
Creatinine, Ser: 1.28 — ABNORMAL HIGH
GFR calc Af Amer: 50 — ABNORMAL LOW
GFR calc non Af Amer: 41 — ABNORMAL LOW
Glucose, Bld: 159 — ABNORMAL HIGH
Sodium: 139

## 2011-05-28 LAB — CBC
Platelets: 227
RDW: 13.1

## 2011-05-28 LAB — LIPID PANEL
HDL: 36 — ABNORMAL LOW
Triglycerides: 271 — ABNORMAL HIGH
VLDL: 54 — ABNORMAL HIGH

## 2012-11-12 ENCOUNTER — Other Ambulatory Visit: Payer: Self-pay

## 2012-11-12 MED ORDER — LEVOTHYROXINE SODIUM 75 MCG PO TABS: 75.0000 ug | ORAL_TABLET | Freq: Every day | ORAL | Status: DC

## 2012-12-17 ENCOUNTER — Other Ambulatory Visit: Payer: Self-pay | Admitting: Family Medicine

## 2012-12-30 ENCOUNTER — Ambulatory Visit (INDEPENDENT_AMBULATORY_CARE_PROVIDER_SITE_OTHER): Payer: Medicare Other | Admitting: Family Medicine

## 2012-12-30 ENCOUNTER — Ambulatory Visit (INDEPENDENT_AMBULATORY_CARE_PROVIDER_SITE_OTHER): Payer: Medicare Other

## 2012-12-30 VITALS — BP 161/89 | HR 131 | Temp 97.7°F | Ht 65.5 in | Wt 145.8 lb

## 2012-12-30 DIAGNOSIS — R05 Cough: Secondary | ICD-10-CM

## 2012-12-30 DIAGNOSIS — N189 Chronic kidney disease, unspecified: Secondary | ICD-10-CM

## 2012-12-30 DIAGNOSIS — R062 Wheezing: Secondary | ICD-10-CM

## 2012-12-30 DIAGNOSIS — J302 Other seasonal allergic rhinitis: Secondary | ICD-10-CM

## 2012-12-30 DIAGNOSIS — J309 Allergic rhinitis, unspecified: Secondary | ICD-10-CM

## 2012-12-30 DIAGNOSIS — E039 Hypothyroidism, unspecified: Secondary | ICD-10-CM

## 2012-12-30 DIAGNOSIS — J209 Acute bronchitis, unspecified: Secondary | ICD-10-CM

## 2012-12-30 DIAGNOSIS — I10 Essential (primary) hypertension: Secondary | ICD-10-CM

## 2012-12-30 DIAGNOSIS — R059 Cough, unspecified: Secondary | ICD-10-CM

## 2012-12-30 DIAGNOSIS — M129 Arthropathy, unspecified: Secondary | ICD-10-CM

## 2012-12-30 DIAGNOSIS — E785 Hyperlipidemia, unspecified: Secondary | ICD-10-CM

## 2012-12-30 MED ORDER — LOSARTAN POTASSIUM 100 MG PO TABS: 100.0000 mg | ORAL_TABLET | Freq: Every day | ORAL | Status: DC

## 2012-12-30 MED ORDER — AMOXICILLIN 875 MG PO TABS: 875.0000 mg | ORAL_TABLET | Freq: Two times a day (BID) | ORAL | Status: DC

## 2012-12-30 MED ORDER — HYDROCODONE-HOMATROPINE 5-1.5 MG/5ML PO SYRP: 5.0000 mL | ORAL_SOLUTION | Freq: Three times a day (TID) | ORAL | Status: DC | PRN

## 2012-12-30 MED ORDER — CETIRIZINE HCL 10 MG PO TABS: 10.0000 mg | ORAL_TABLET | Freq: Every day | ORAL | Status: DC

## 2012-12-30 MED ORDER — METHYLPREDNISOLONE ACETATE 80 MG/ML IJ SUSP
80.0000 mg | Freq: Once | INTRAMUSCULAR | Status: AC
  Administered 2012-12-30: 80 mg via INTRAMUSCULAR

## 2012-12-30 NOTE — Progress Notes (Signed)
Tolerated injection well. 

## 2012-12-30 NOTE — Patient Instructions (Signed)
Methylprednisolone Suspension for Injection What is this medicine? METHYLPREDNISOLONE (meth ill pred NISS oh lone) is a corticosteroid. It is commonly used to treat inflammation of the skin, joints, lungs, and other organs. Common conditions treated include asthma, allergies, and arthritis. It is also used for other conditions, such as blood disorders and diseases of the adrenal glands. This medicine may be used for other purposes; ask your health care provider or pharmacist if you have questions. What should I tell my health care provider before I take this medicine? They need to know if you have any of these conditions: -cataracts or glaucoma -Cushings -heart disease -high blood pressure -infection including tuberculosis -low calcium or potassium levels in the blood -recent surgery -seizures -stomach or intestinal disease, including colitis -threadworms -thyroid problems -an unusual or allergic reaction to methylprednisolone, corticosteroids, benzyl alcohol, other medicines, foods, dyes, or preservatives -pregnant or trying to get pregnant -breast-feeding How should I use this medicine? This medicine is for injection into a muscle, joint, or other tissue. It is given by a health care professional in a hospital or clinic setting. Talk to your pediatrician regarding the use of this medicine in children. While this drug may be prescribed for selected conditions, precautions do apply. Overdosage: If you think you have taken too much of this medicine contact a poison control center or emergency room at once. NOTE: This medicine is only for you. Do not share this medicine with others. What if I miss a dose? This does not apply. What may interact with this medicine? Do not take this medicine with any of the following medications: -mifepristone -radiopaque contrast agents This medicine may also interact with the following medications: -aspirin and aspirin-like  medicines -cyclosporin -ketoconazole -phenobarbital -phenytoin -rifampin -tacrolimus -troleandomycin -vaccines -warfarin This list may not describe all possible interactions. Give your health care provider a list of all the medicines, herbs, non-prescription drugs, or dietary supplements you use. Also tell them if you smoke, drink alcohol, or use illegal drugs. Some items may interact with your medicine. What should I watch for while using this medicine? Visit your doctor or health care professional for regular checks on your progress. If you are taking this medicine for a long time, carry an identification card with your name and address, the type and dose of your medicine, and your doctor's name and address. The medicine may increase your risk of getting an infection. Stay away from people who are sick. Tell your doctor or health care professional if you are around anyone with measles or chickenpox. You may need to avoid some vaccines. Talk to your health care provider for more information. If you are going to have surgery, tell your doctor or health care professional that you have taken this medicine within the last twelve months. Ask your doctor or health care professional about your diet. You may need to lower the amount of salt you eat. The medicine can increase your blood sugar. If you are a diabetic check with your doctor if you need help adjusting the dose of your diabetic medicine. What side effects may I notice from receiving this medicine? Side effects that you should report to your doctor or health care professional as soon as possible: -allergic reactions like skin rash, itching or hives, swelling of the face, lips, or tongue -bloody or tarry stools -changes in vision -eye pain or bulging eyes -fever, sore throat, sneezing, cough, or other signs of infection, wounds that will not heal -increased thirst -irregular heartbeat -muscle cramps -pain  in hips, back, ribs, arms,  shoulders, or legs -swelling of the ankles, feet, hands -trouble passing urine or change in the amount of urine -unusual bleeding or bruising -unusually weak or tired -weight gain or weight loss Side effects that usually do not require medical attention (report to your doctor or health care professional if they continue or are bothersome): -changes in emotions or moods -constipation or diarrhea -headache -irritation at site where injected -nausea, vomiting -skin problems, acne, thin and shiny skin -trouble sleeping -unusual hair growth on the face or body This list may not describe all possible side effects. Call your doctor for medical advice about side effects. You may report side effects to FDA at 1-800-FDA-1088. Where should I keep my medicine? This drug is given in a hospital or clinic and will not be stored at home. NOTE: This sheet is a summary. It may not cover all possible information. If you have questions about this medicine, talk to your doctor, pharmacist, or health care provider.  2013, [employer] Standard. (02/23/2008 2:36:31 PM)

## 2012-12-30 NOTE — Progress Notes (Signed)
Patient ID: [name], female   DOB: 19-Jan-1934, 79 y.o.   MRN: [id number] SUBJECTIVE: HPI:  Congested for:  2 weeks. Went to the [facility] last week and told to use allegra   has not had runny and itchy eyes. has had runny, itchy and stuffy nose. has not had Sneezing as well. has had Coughing has had Wheezing  Medications used for this problem:allegra has not had effective response.  PMH/PSH: reviewed/updated in Epic  SH/FH: reviewed/updated in Epic  Allergies: reviewed/updated in Epic  Medications: reviewed/updated in Epic  Immunizations: reviewed/updated in Epic  ROS: As above in the HPI. All other systems are stable or negative.  OBJECTIVE: APPEARANCE:  Patient in no acute distress.The patient appeared well nourished and normally developed. Acyanotic. Waist: VITAL SIGNS:BP 161/89  Pulse 131  Temp(Src) 97.7 F (36.5 C) (Oral)  Ht 5' 5.5" (1.664 m)  Wt 145 lb 12.8 oz (66.134 kg)  BMI 23.88 kg/m2   SKIN: warm and  Dry without overt rashes, tattoos and scars  HEAD and Neck: without JVD, Head and scalp: normal Eyes:No scleral icterus. Fundi normal, eye movements normal. Ears: Auricle normal, canal normal, Tympanic membranes normal, insufflation normal. Nose:congested, rhinitis Throat: normal Neck & thyroid: normal  CHEST & LUNGS: Chest wall: normal Lungs:Paroxysms of cough, coarse BS and Rhonchi and a few end  Expiratory wheezes CVS: Reveals the PMI to be normally located. Regular rhythm, First and Second Heart sounds are normal,  absence of murmurs, rubs or gallops.  ABDOMEN:  Appearance: normal Benign,, no organomegaly, no masses, no Abdominal Aortic enlargement. No Guarding , no rebound. No Bruits. Bowel sounds: normal  RECTAL: N/A GU: N/A  EXTREMETIES: nonedematous. Both Femoral and Pedal pulses are normal.  MUSCULOSKELETAL:  Spine: normal Joints: intact  NEUROLOGIC: oriented to time,place and person; nonfocal. Strength is normal Sensory is  normal Reflexes are normal Cranial Nerves are normal.  ASSESSMENT: Cough - Plan: DG Chest 2 View, losartan (COZAAR) 100 MG tablet, HYDROcodone-homatropine (HYCODAN) 5-1.5 MG/5ML syrup  Wheeze - Plan: DG Chest 2 View, HYDROcodone-homatropine (HYCODAN) 5-1.5 MG/5ML syrup, cetirizine (ZYRTEC) 10 MG tablet, methylPREDNISolone acetate (DEPO-MEDROL) injection 80 mg  RENAL INSUFFICIENCY, CHRONIC  HYPOTHYROIDISM  HYPERTENSION  HYPERLIPIDEMIA  ARTHRITIS  Acute bronchitis - Plan: HYDROcodone-homatropine (HYCODAN) 5-1.5 MG/5ML syrup, amoxicillin (AMOXIL) 875 MG tablet, methylPREDNISolone acetate (DEPO-MEDROL) injection 80 mg  Seasonal allergic rhinitis - Plan: cetirizine (ZYRTEC) 10 MG tablet, methylPREDNISolone acetate (DEPO-MEDROL) injection 80 mg    PLAN:  [facility] reading (PRIMARY) by  Dr. Jacelyn Grip.No acute findings Orders Placed This Encounter  Procedures  . DG Chest 2 View    Standing Status: Future     Number of Occurrences: 1     Standing Expiration Date: 03/01/2014    Order Specific Question:  Reason for Exam (SYMPTOM  OR DIAGNOSIS REQUIRED)    Answer:  cough wheezing and ssob    Order Specific Question:  Preferred imaging location?    Answer:  Internal   Meds ordered this encounter  Medications  . DISCONTD: lisinopril (PRINIVIL,ZESTRIL) 20 MG tablet    Sig: Take 20 mg by mouth 2 (two) times daily.  Marland Kitchen losartan (COZAAR) 100 MG tablet    Sig: Take 1 tablet (100 mg total) by mouth daily.    Dispense:  90 tablet    Refill:  3  . HYDROcodone-homatropine (HYCODAN) 5-1.5 MG/5ML syrup    Sig: Take 5 mLs by mouth every 8 (eight) hours as needed for cough.    Dispense:  120 mL  Refill:  0  . cetirizine (ZYRTEC) 10 MG tablet    Sig: Take 1 tablet (10 mg total) by mouth daily.    Dispense:  30 tablet    Refill:  11  . amoxicillin (AMOXIL) 875 MG tablet    Sig: Take 1 tablet (875 mg total) by mouth 2 (two) times daily.    Dispense:  20 tablet    Refill:  0  . methylPREDNISolone  acetate (DEPO-MEDROL) injection 80 mg    Sig:    Avoid allergens.  RTc in 2 weeks  [name] P. Jacelyn Grip, M.D.

## 2013-01-13 ENCOUNTER — Encounter: Payer: Self-pay | Admitting: Family Medicine

## 2013-01-13 ENCOUNTER — Ambulatory Visit (INDEPENDENT_AMBULATORY_CARE_PROVIDER_SITE_OTHER): Payer: Medicare Other | Admitting: Family Medicine

## 2013-01-13 VITALS — BP 172/70 | HR 56 | Temp 97.6°F | Ht 65.0 in | Wt 144.4 lb

## 2013-01-13 DIAGNOSIS — N189 Chronic kidney disease, unspecified: Secondary | ICD-10-CM

## 2013-01-13 DIAGNOSIS — I10 Essential (primary) hypertension: Secondary | ICD-10-CM

## 2013-01-13 DIAGNOSIS — E785 Hyperlipidemia, unspecified: Secondary | ICD-10-CM

## 2013-01-13 DIAGNOSIS — E039 Hypothyroidism, unspecified: Secondary | ICD-10-CM

## 2013-01-13 DIAGNOSIS — M129 Arthropathy, unspecified: Secondary | ICD-10-CM

## 2013-01-13 LAB — COMPLETE METABOLIC PANEL WITH GFR
ALT: 18 U/L (ref 0–35)
AST: 17 U/L (ref 0–37)
Albumin: 4.1 g/dL (ref 3.5–5.2)
Alkaline Phosphatase: 62 U/L (ref 39–117)
BUN: 19 mg/dL (ref 6–23)
CO2: 34 mEq/L — ABNORMAL HIGH (ref 19–32)
Calcium: 9.6 mg/dL (ref 8.4–10.5)
Chloride: 97 mEq/L (ref 96–112)
Creat: 1.42 mg/dL — ABNORMAL HIGH (ref 0.50–1.10)
GFR, Est African American: 41 mL/min — ABNORMAL LOW
GFR, Est Non African American: 36 mL/min — ABNORMAL LOW
Glucose, Bld: 115 mg/dL — ABNORMAL HIGH (ref 70–99)
Potassium: 4.2 mEq/L (ref 3.5–5.3)
Sodium: 139 mEq/L (ref 135–145)
Total Bilirubin: 0.5 mg/dL (ref 0.3–1.2)
Total Protein: 6.7 g/dL (ref 6.0–8.3)

## 2013-01-13 LAB — TSH: TSH: 1.641 u[IU]/mL (ref 0.350–4.500)

## 2013-01-13 MED ORDER — ATENOLOL-CHLORTHALIDONE 50-25 MG PO TABS: 1.0000 | ORAL_TABLET | Freq: Every day | ORAL | Status: DC

## 2013-01-13 NOTE — Progress Notes (Signed)
Patient ID: Dhruvi Pflanz, female   DOB: [birth year], 79 y.o.   MRN: [id number] SUBJECTIVE: HPI: Here to recheck bronchitis and cough.Symptoms resolved. Feels fine now.  Patient is here for follow up of hyperlipidemia/hypertension/hypothyroidism/arthriti:  denies Headache;denies Chest Pain;denies weakness;denies Shortness of Breath and orthopnea;denies Visual changes;denies palpitations;denies cough;denies pedal edema;denies symptoms of TIA or stroke;deniesClaudication symptoms. admits to Compliance with medications; denies Problems with medications.  Now feeling well. Husband had a stroke and he is doing well.   PMH/PSH: reviewed/updated in Epic  SH/FH: reviewed/updated in Epic  Allergies: reviewed/updated in Epic  Medications: reviewed/updated in Epic  Immunizations: reviewed/updated in Epic  ROS: As above in the HPI. All other systems are stable or negative.  OBJECTIVE: APPEARANCE:  Patient in no acute distress.The patient appeared well nourished and normally developed. Acyanotic. Waist: VITAL SIGNS:BP 172/70  Pulse 56  Temp(Src) 97.6 F (36.4 C) (Oral)  Ht 5\' 5"  (1.651 m)  Wt 144 lb 6.4 oz (65.499 kg)  BMI 24.03 kg/m2 BP recheck 130/70 WF  , her BPs have been in the normal range. She brought in her readings.120s/60to70s. She check her bps daily. She gets nervous when she comes.   SKIN: warm and  Dry without overt rashes, tattoos and scars. Ecchymosis of the forearms.  HEAD and Neck: without JVD, Head and scalp: normal Eyes:No scleral icterus. Fundi normal, eye movements normal. Ears: Auricle normal, canal normal, Tympanic membranes normal, insufflation normal. Nose: normal Throat: normal Neck & thyroid: normal  CHEST & LUNGS: Chest wall: normal Lungs: Clear  CVS: Reveals the PMI to be normally located. Regular rhythm, First and Second Heart sounds are normal,  absence of murmurs, rubs or gallops. Peripheral vasculature: Radial pulses: normal Dorsal  pedis pulses: normal Posterior pulses: normal  ABDOMEN:  Appearance: normal Benign,, no organomegaly, no masses, no Abdominal Aortic enlargement. No Guarding , no rebound. No Bruits. Bowel sounds: normal  RECTAL: N/A GU: N/A  EXTREMETIES: nonedematous. Both Femoral and Pedal pulses are normal.  MUSCULOSKELETAL:  Spine: normal  NEUROLOGIC: oriented to time,place and person; nonfocal.  ASSESSMENT: RENAL INSUFFICIENCY, CHRONIC - Plan: COMPLETE METABOLIC PANEL WITH GFR  HYPOTHYROIDISM - Plan: TSH  HYPERLIPIDEMIA - Plan: COMPLETE METABOLIC PANEL WITH GFR, NMR Lipoprofile with Lipids  HYPERTENSION - Plan: COMPLETE METABOLIC PANEL WITH GFR, atenolol-chlorthalidone (TENORETIC) 50-25 MG per tablet  ARTHRITIS  Cough and bronchitis resolved.  PLAN: Orders Placed This Encounter  Procedures  . COMPLETE METABOLIC PANEL WITH GFR  . NMR Lipoprofile with Lipids  . TSH   Meds ordered this encounter  Medications  . atenolol-chlorthalidone (TENORETIC) 50-25 MG per tablet    Sig: Take 1 tablet by mouth daily.    Dispense:  90 tablet    Refill:  1        Dr Paula Libra Recommendations  Diet and Exercise discussed with patient.  For nutrition information, I recommend books:  1).Eat to Live by Dr Excell Seltzer. 2).Prevent and Reverse Heart Disease by Dr Karl Luke. 3) Dr Janene Harvey Book: Reversing Diabetes  Exercise recommendations are:  If unable to walk, then the patient can exercise in a chair 3 times a day. By flapping arms like a bird gently and raising legs outwards to the front.  If ambulatory, the patient can go for walks for 30 minutes 3 times a week. Then increase the intensity and duration as tolerated.  Goal is to try to attain exercise frequency to 5 times a week.  If applicable: Best to perform resistance exercises (machines or weights)  2 days a week and cardio type exercises 3 days per week.  RTC 3 months.  Coline Calkin P. Jacelyn Grip, M.D.

## 2013-01-13 NOTE — Patient Instructions (Addendum)
      Dr Verlia Kaney's Recommendations  Diet and Exercise discussed with patient.  For nutrition information, I recommend books:  1).Eat to Live by Dr Joel Fuhrman. 2).Prevent and Reverse Heart Disease by Dr Caldwell Esselstyn. 3) Dr Neal Barnard's Book: Reversing Diabetes  Exercise recommendations are:  If unable to walk, then the patient can exercise in a chair 3 times a day. By flapping arms like a bird gently and raising legs outwards to the front.  If ambulatory, the patient can go for walks for 30 minutes 3 times a week. Then increase the intensity and duration as tolerated.  Goal is to try to attain exercise frequency to 5 times a week.  If applicable: Best to perform resistance exercises (machines or weights) 2 days a week and cardio type exercises 3 days per week.  

## 2013-01-14 LAB — NMR LIPOPROFILE WITH LIPIDS
Cholesterol, Total: 159 mg/dL (ref <200)
HDL Particle Number: 37.9 umol/L (ref >=30.5)
HDL Size: 9.1 nm — ABNORMAL LOW (ref >=9.2)
HDL-C: 54 mg/dL (ref >=40)
LDL (calc): 77 mg/dL (ref <100)
LDL Particle Number: 1093 nmol/L — ABNORMAL HIGH (ref <1000)
LDL Size: 20.6 nm (ref >20.5)
LP-IR Score: 37 (ref <=45)
Large HDL-P: 7.4 umol/L (ref >=4.8)
Large VLDL-P: 1.3 nmol/L (ref <=2.7)
Small LDL Particle Number: 660 nmol/L — ABNORMAL HIGH (ref <=527)
Triglycerides: 142 mg/dL (ref <150)
VLDL Size: 48.2 nm — ABNORMAL HIGH (ref <=46.6)

## 2013-01-18 NOTE — Progress Notes (Signed)
Quick Note:  Lab result at goal. The creatinine is elevated and needs to be followed. No change in Medications for now. No change in plans and follow up. ______

## 2013-01-19 ENCOUNTER — Telehealth: Payer: Self-pay | Admitting: Family Medicine

## 2013-01-20 ENCOUNTER — Encounter: Payer: Self-pay | Admitting: Family Medicine

## 2013-01-20 ENCOUNTER — Ambulatory Visit (INDEPENDENT_AMBULATORY_CARE_PROVIDER_SITE_OTHER): Payer: Medicare Other | Admitting: Family Medicine

## 2013-01-20 VITALS — BP 187/72 | HR 62 | Temp 97.8°F | Wt 144.8 lb

## 2013-01-20 DIAGNOSIS — N189 Chronic kidney disease, unspecified: Secondary | ICD-10-CM

## 2013-01-20 DIAGNOSIS — L509 Urticaria, unspecified: Secondary | ICD-10-CM

## 2013-01-20 MED ORDER — PREDNISONE 20 MG PO TABS: 40.0000 mg | ORAL_TABLET | Freq: Every day | ORAL | Status: DC

## 2013-01-20 MED ORDER — METHYLPREDNISOLONE ACETATE 80 MG/ML IJ SUSP
80.0000 mg | Freq: Once | INTRAMUSCULAR | Status: AC
  Administered 2013-01-20: 80 mg via INTRAMUSCULAR

## 2013-01-20 NOTE — Progress Notes (Signed)
Patient ID: [name], female   DOB: Jul 04, 1934, 79 y.o.   MRN: [id number] SUBJECTIVE: Chief Complaint  Patient presents with  . Rash    onset today  rash on stomach   HPI: Broke in a rash on the abdomen and it seems to be going inside her. Happened a long time ago and had to get steroid pills. No allergen exposure that she is aware. Denies allergy to food. No coughing and no SOB.   PMH/PSH: reviewed/updated in Epic  SH/FH: reviewed/updated in Epic  Allergies: reviewed/updated in Epic  Medications: reviewed/updated in Epic  Immunizations: reviewed/updated in Epic  ROS: As above in the HPI. All other systems are stable or negative.  OBJECTIVE: APPEARANCE:  Patient in no acute distress.The patient appeared well nourished and normally developed. Acyanotic. Waist: VITAL SIGNS:BP 187/72  Pulse 62  Temp(Src) 97.8 F (36.6 C) (Oral)  Wt 144 lb 12.8 oz (65.681 kg)  BMI 24.1 kg/m2 WF Her BPs tend to be high when she comes due to white coat syndrome and tends to be acceptable or normal at home.  SKIN: warm and  Dry without overt, tattoos and scars. Hives on the abdomen/ trunk.  HEAD and Neck: without JVD, Head and scalp: normal Eyes:No scleral icterus. Fundi normal, eye movements normal. Ears: Auricle normal, canal normal, Tympanic membranes normal, insufflation normal. Nose: normal Throat: normal Neck & thyroid: normal  CHEST & LUNGS: Chest wall: normal Lungs: Clear  CVS: Reveals the PMI to be normally located. Regular rhythm, First and Second Heart sounds are normal,  absence of murmurs, rubs or gallops. Peripheral vasculature: Radial pulses: normal Dorsal pedis pulses: normal Posterior pulses: normal  ABDOMEN:  Appearance: normal Benign, no organomegaly, no masses, no Abdominal Aortic enlargement. No Guarding , no rebound. No Bruits. Bowel sounds: normal  RECTAL: N/A GU: N/A  EXTREMETIES: nonedematous. Both Femoral and Pedal pulses are  normal.  MUSCULOSKELETAL:  Spine: normal   NEUROLOGIC: oriented to time,place and person; nonfocal.  Results for orders placed in visit on 01/13/13  COMPLETE METABOLIC PANEL WITH GFR      Result Value Range   Sodium 139  135 - 145 mEq/L   Potassium 4.2  3.5 - 5.3 mEq/L   Chloride 97  96 - 112 mEq/L   CO2 34 (*) 19 - 32 mEq/L   Glucose, Bld 115 (*) 70 - 99 mg/dL   BUN 19  6 - 23 mg/dL   Creat 1.42 (*) 0.50 - 1.10 mg/dL   Total Bilirubin 0.5  0.3 - 1.2 mg/dL   Alkaline Phosphatase 62  39 - 117 U/L   AST 17  0 - 37 U/L   ALT 18  0 - 35 U/L   Total Protein 6.7  6.0 - 8.3 g/dL   Albumin 4.1  3.5 - 5.2 g/dL   Calcium 9.6  8.4 - 10.5 mg/dL   GFR, Est African American 41 (*)    GFR, Est Non African American 36 (*)   NMR LIPOPROFILE WITH LIPIDS      Result Value Range   LDL Particle Number [id number] (*) <1000 nmol/L   LDL (calc) 77  <100 mg/dL   HDL-C 54  >=40 mg/dL   Triglycerides 142  <150 mg/dL   Cholesterol, Total 159  <200 mg/dL   HDL Particle Number 37.9  >=30.5 umol/L   Large HDL-P 7.4  >=4.8 umol/L   Large VLDL-P 1.3  <=2.7 nmol/L   Small LDL Particle Number 660 (*) <=527 nmol/L   LDL  Size 20.6  >20.5 nm   HDL Size 9.1 (*) >=9.2 nm   VLDL Size 48.2 (*) <=46.6 nm   LP-IR Score 37  <=45  TSH      Result Value Range   TSH 1.641  0.350 - 4.500 uIU/mL    ASSESSMENT: Hives - Plan: predniSONE (DELTASONE) 20 MG tablet, methylPREDNISolone acetate (DEPO-MEDROL) injection 80 mg  CKD (chronic kidney disease), unspecified stage - Plan: BASIC METABOLIC PANEL WITH GFR  Reviewed her labs with her.  PLAN: Orders Placed This Encounter  Procedures  . BASIC METABOLIC PANEL WITH GFR    Standing Status: Future     Number of Occurrences:      Standing Expiration Date: 03/22/2013   Meds ordered this encounter  Medications  . predniSONE (DELTASONE) 20 MG tablet    Sig: Take 2 tablets (40 mg total) by mouth daily. For 2 days, then 1 tab for 2 days, then 1/2 tab for 2 days    Dispense:   7 tablet    Refill:  0  . methylPREDNISolone acetate (DEPO-MEDROL) injection 80 mg    Sig:    Return if symptoms worsen or fail to improve, for Recheck medical problems.  Amalya Salmons P. Jacelyn Grip, M.D.

## 2013-01-20 NOTE — Patient Instructions (Addendum)
Methylprednisolone Suspension for Injection What is this medicine? METHYLPREDNISOLONE (meth ill pred NISS oh lone) is a corticosteroid. It is commonly used to treat inflammation of the skin, joints, lungs, and other organs. Common conditions treated include asthma, allergies, and arthritis. It is also used for other conditions, such as blood disorders and diseases of the adrenal glands. This medicine may be used for other purposes; ask your health care provider or pharmacist if you have questions. What should I tell my health care provider before I take this medicine? They need to know if you have any of these conditions: -cataracts or glaucoma -Cushings -heart disease -high blood pressure -infection including tuberculosis -low calcium or potassium levels in the blood -recent surgery -seizures -stomach or intestinal disease, including colitis -threadworms -thyroid problems -an unusual or allergic reaction to methylprednisolone, corticosteroids, benzyl alcohol, other medicines, foods, dyes, or preservatives -pregnant or trying to get pregnant -breast-feeding How should I use this medicine? This medicine is for injection into a muscle, joint, or other tissue. It is given by a health care professional in a hospital or clinic setting. Talk to your pediatrician regarding the use of this medicine in children. While this drug may be prescribed for selected conditions, precautions do apply. Overdosage: If you think you have taken too much of this medicine contact a poison control center or emergency room at once. NOTE: This medicine is only for you. Do not share this medicine with others. What if I miss a dose? This does not apply. What may interact with this medicine? Do not take this medicine with any of the following medications: -mifepristone -radiopaque contrast agents This medicine may also interact with the following medications: -aspirin and aspirin-like  medicines -cyclosporin -ketoconazole -phenobarbital -phenytoin -rifampin -tacrolimus -troleandomycin -vaccines -warfarin This list may not describe all possible interactions. Give your health care provider a list of all the medicines, herbs, non-prescription drugs, or dietary supplements you use. Also tell them if you smoke, drink alcohol, or use illegal drugs. Some items may interact with your medicine. What should I watch for while using this medicine? Visit your doctor or health care professional for regular checks on your progress. If you are taking this medicine for a long time, carry an identification card with your name and address, the type and dose of your medicine, and your doctor's name and address. The medicine may increase your risk of getting an infection. Stay away from people who are sick. Tell your doctor or health care professional if you are around anyone with measles or chickenpox. You may need to avoid some vaccines. Talk to your health care provider for more information. If you are going to have surgery, tell your doctor or health care professional that you have taken this medicine within the last twelve months. Ask your doctor or health care professional about your diet. You may need to lower the amount of salt you eat. The medicine can increase your blood sugar. If you are a diabetic check with your doctor if you need help adjusting the dose of your diabetic medicine. What side effects may I notice from receiving this medicine? Side effects that you should report to your doctor or health care professional as soon as possible: -allergic reactions like skin rash, itching or hives, swelling of the face, lips, or tongue -bloody or tarry stools -changes in vision -eye pain or bulging eyes -fever, sore throat, sneezing, cough, or other signs of infection, wounds that will not heal -increased thirst -irregular heartbeat -muscle cramps -pain  in hips, back, ribs, arms,  shoulders, or legs -swelling of the ankles, feet, hands -trouble passing urine or change in the amount of urine -unusual bleeding or bruising -unusually weak or tired -weight gain or weight loss Side effects that usually do not require medical attention (report to your doctor or health care professional if they continue or are bothersome): -changes in emotions or moods -constipation or diarrhea -headache -irritation at site where injected -nausea, vomiting -skin problems, acne, thin and shiny skin -trouble sleeping -unusual hair growth on the face or body This list may not describe all possible side effects. Call your doctor for medical advice about side effects. You may report side effects to FDA at 1-800-FDA-1088. Where should I keep my medicine? This drug is given in a hospital or clinic and will not be stored at home. NOTE: This sheet is a summary. It may not cover all possible information. If you have questions about this medicine, talk to your doctor, pharmacist, or health care provider.  2013, [employer] Standard. (02/23/2008 2:36:31 PM)

## 2013-01-20 NOTE — Progress Notes (Signed)
Tolerated injection well. 

## 2013-01-20 NOTE — Telephone Encounter (Signed)
Pt.notified

## 2013-01-21 ENCOUNTER — Other Ambulatory Visit: Payer: Self-pay | Admitting: Family Medicine

## 2013-01-25 ENCOUNTER — Ambulatory Visit: Payer: Self-pay | Admitting: Family Medicine

## 2013-01-26 ENCOUNTER — Ambulatory Visit (INDEPENDENT_AMBULATORY_CARE_PROVIDER_SITE_OTHER): Payer: Medicare Other | Admitting: General Practice

## 2013-01-26 VITALS — BP 162/80 | HR 76 | Temp 97.6°F | Ht 65.0 in | Wt 144.0 lb

## 2013-01-26 DIAGNOSIS — J309 Allergic rhinitis, unspecified: Secondary | ICD-10-CM

## 2013-01-26 DIAGNOSIS — R05 Cough: Secondary | ICD-10-CM

## 2013-01-26 MED ORDER — HYDROCODONE-HOMATROPINE 5-1.5 MG/5ML PO SYRP: 5.0000 mL | ORAL_SOLUTION | Freq: Three times a day (TID) | ORAL | Status: DC | PRN

## 2013-01-26 NOTE — Patient Instructions (Addendum)
Allergic Rhinitis Allergic rhinitis is when the mucous membranes in the nose respond to allergens. Allergens are particles in the air that cause your body to have an allergic reaction. This causes you to release allergic antibodies. Through a chain of events, these eventually cause you to release histamine into the blood stream (hence the use of antihistamines). Although meant to be protective to the body, it is this release that causes your discomfort, such as frequent sneezing, congestion and an itchy runny nose.  CAUSES  The pollen allergens may come from grasses, trees, and weeds. This is seasonal allergic rhinitis, or "hay fever." Other allergens cause year-round allergic rhinitis (perennial allergic rhinitis) such as house dust mite allergen, pet dander and mold spores.  SYMPTOMS   Nasal stuffiness (congestion).  Runny, itchy nose with sneezing and tearing of the eyes.  There is often an itching of the mouth, eyes and ears. It cannot be cured, but it can be controlled with medications. DIAGNOSIS  If you are unable to determine the offending allergen, skin or blood testing may find it. TREATMENT   Avoid the allergen.  Medications and allergy shots (immunotherapy) can help.  Hay fever may often be treated with antihistamines in pill or nasal spray forms. Antihistamines block the effects of histamine. There are over-the-counter medicines that may help with nasal congestion and swelling around the eyes. Check with your caregiver before taking or giving this medicine. If the treatment above does not work, there are many new medications your caregiver can prescribe. Stronger medications may be used if initial measures are ineffective. Desensitizing injections can be used if medications and avoidance fails. Desensitization is when a patient is given ongoing shots until the body becomes less sensitive to the allergen. Make sure you follow up with your caregiver if problems continue. SEEK MEDICAL  CARE IF:   You develop fever (more than 100.5 F (38.1 C).  You develop a cough that does not stop easily (persistent).  You have shortness of breath.  You start wheezing.  Symptoms interfere with normal daily activities. Document Released: 04/29/2001 Document Revised: 10/27/2011 Document Reviewed: 11/08/2008 [facility] Patient Information 2014 [place]. Cough, Adult  A cough is a reflex that helps clear your throat and airways. It can help heal the body or may be a reaction to an irritated airway. A cough may only last 2 or 3 weeks (acute) or may last more than 8 weeks (chronic).  CAUSES Acute cough:  Viral or bacterial infections. Chronic cough:  Infections.  Allergies.  Asthma.  Post-nasal drip.  Smoking.  Heartburn or acid reflux.  Some medicines.  Chronic lung problems (COPD).  Cancer. SYMPTOMS   Cough.  Fever.  Chest pain.  Increased breathing rate.  High-pitched whistling sound when breathing (wheezing).  Colored mucus that you cough up (sputum). TREATMENT   A bacterial cough may be treated with antibiotic medicine.  A viral cough must run its course and will not respond to antibiotics.  Your caregiver may recommend other treatments if you have a chronic cough. HOME CARE INSTRUCTIONS   Only take over-the-counter or prescription medicines for pain, discomfort, or fever as directed by your caregiver. Use cough suppressants only as directed by your caregiver.  Use a cold steam vaporizer or humidifier in your bedroom or home to help loosen secretions.  Sleep in a semi-upright position if your cough is worse at night.  Rest as needed.  Stop smoking if you smoke. SEEK IMMEDIATE MEDICAL CARE IF:   You have pus  in your sputum.  Your cough starts to worsen.  You cannot control your cough with suppressants and are losing sleep.  You begin coughing up blood.  You have difficulty breathing.  You develop pain which is getting worse or  is uncontrolled with medicine.  You have a fever. MAKE SURE YOU:   Understand these instructions.  Will watch your condition.  Will get help right away if you are not doing well or get worse. Document Released: 01/31/2011 Document Revised: 10/27/2011 Document Reviewed: 01/31/2011 [facility] Patient Information 2014 [place].

## 2013-01-26 NOTE — Progress Notes (Signed)
  Subjective:    Patient ID: [name], female    DOB: 09-19-1933, 79 y.o.   MRN: [id number]  Cough This is a new problem. The current episode started in the past 7 days. The problem has been gradually worsening. The problem occurs hourly. The cough is non-productive. Associated symptoms include postnasal drip and rhinorrhea. Pertinent negatives include no chest pain, chills, ear congestion, ear pain, fever, headaches, shortness of breath or wheezing. The symptoms are aggravated by lying down. She has tried nothing for the symptoms. Her past medical history is significant for bronchitis. There is no history of asthma.  Reports she has zyrtec prescribed, but hasn't been taking daily as ordered.     Review of Systems  Constitutional: Negative for fever and chills.  HENT: Positive for rhinorrhea and postnasal drip. Negative for ear pain.   Respiratory: Positive for cough. Negative for chest tightness, shortness of breath and wheezing.   Cardiovascular: Negative for chest pain and palpitations.  Genitourinary: Negative for difficulty urinating.  Neurological: Negative for dizziness and headaches.       Objective:   Physical Exam  Constitutional: She is oriented to person, place, and time. She appears well-developed and well-nourished.  HENT:  Head: Normocephalic and atraumatic.  Cardiovascular: Normal rate, regular rhythm and normal heart sounds.   Pulmonary/Chest: Effort normal and breath sounds normal. No respiratory distress. She exhibits no tenderness.  Neurological: She is alert and oriented to person, place, and time.  Skin: Skin is warm and dry. No rash noted.  Psychiatric: She has a normal mood and affect.          Assessment & Plan:  1. Allergic rhinitis  -Take zyrtec as directed, daily -avoid known allergens  2. cough - HYDROcodone-homatropine (HYCODAN) 5-1.5 MG/5ML syrup; Take 5 mLs by mouth every 8 (eight) hours as needed for cough.  Dispense: 120 mL; Refill:  0 -RTO if symptoms worsen  -Follow up in 2 weeks for recheck, will repeat xray if no improvement or unresolved -Patient verbalized understanding -Erby Pian, FNP-C

## 2013-01-27 ENCOUNTER — Telehealth: Payer: Self-pay | Admitting: *Deleted

## 2013-01-27 NOTE — Telephone Encounter (Signed)
Lmom, cough syrup called to pharmacy,(hycodan 5-1.5 mg/41ml,   100ml q 8 hous qty 150ml).

## 2013-02-15 ENCOUNTER — Other Ambulatory Visit: Payer: Medicare Other

## 2013-02-22 ENCOUNTER — Other Ambulatory Visit (INDEPENDENT_AMBULATORY_CARE_PROVIDER_SITE_OTHER): Payer: Medicare Other

## 2013-02-22 DIAGNOSIS — N189 Chronic kidney disease, unspecified: Secondary | ICD-10-CM

## 2013-02-22 LAB — BASIC METABOLIC PANEL WITH GFR
BUN: 20 mg/dL (ref 6–23)
CO2: 36 mEq/L — ABNORMAL HIGH (ref 19–32)
Calcium: 9.6 mg/dL (ref 8.4–10.5)
Chloride: 94 mEq/L — ABNORMAL LOW (ref 96–112)
Creat: 1.37 mg/dL — ABNORMAL HIGH (ref 0.50–1.10)
GFR, Est African American: 43 mL/min — ABNORMAL LOW
GFR, Est Non African American: 37 mL/min — ABNORMAL LOW
Glucose, Bld: 87 mg/dL (ref 70–99)
Potassium: 3.8 mEq/L (ref 3.5–5.3)
Sodium: 137 mEq/L (ref 135–145)

## 2013-02-23 NOTE — Progress Notes (Signed)
Patient came in for labs only.

## 2013-02-24 ENCOUNTER — Other Ambulatory Visit: Payer: Medicare Other

## 2013-03-01 ENCOUNTER — Telehealth: Payer: Self-pay | Admitting: Family Medicine

## 2013-03-01 NOTE — Telephone Encounter (Signed)
Mildly productive cough x 2 weeks.  Would like to see Dr. Jacelyn Grip.  She is taking an allergy medication and cough medicine.   Appt scheduled for tomorrow.  Patient aware.

## 2013-03-02 ENCOUNTER — Ambulatory Visit (INDEPENDENT_AMBULATORY_CARE_PROVIDER_SITE_OTHER): Payer: Medicare Other | Admitting: Family Medicine

## 2013-03-02 ENCOUNTER — Encounter: Payer: Self-pay | Admitting: Family Medicine

## 2013-03-02 VITALS — BP 159/77 | HR 62 | Temp 98.3°F | Wt 143.0 lb

## 2013-03-02 DIAGNOSIS — E785 Hyperlipidemia, unspecified: Secondary | ICD-10-CM | POA: Insufficient documentation

## 2013-03-02 DIAGNOSIS — I10 Essential (primary) hypertension: Secondary | ICD-10-CM | POA: Insufficient documentation

## 2013-03-02 DIAGNOSIS — J45909 Unspecified asthma, uncomplicated: Secondary | ICD-10-CM | POA: Insufficient documentation

## 2013-03-02 DIAGNOSIS — R059 Cough, unspecified: Secondary | ICD-10-CM

## 2013-03-02 DIAGNOSIS — R05 Cough: Secondary | ICD-10-CM

## 2013-03-02 DIAGNOSIS — E039 Hypothyroidism, unspecified: Secondary | ICD-10-CM

## 2013-03-02 MED ORDER — AMLODIPINE BESYLATE 10 MG PO TABS: ORAL_TABLET | ORAL | Status: DC

## 2013-03-02 MED ORDER — MONTELUKAST SODIUM 10 MG PO TABS: 10.0000 mg | ORAL_TABLET | Freq: Every day | ORAL | Status: DC

## 2013-03-02 MED ORDER — HYDROCODONE-HOMATROPINE 5-1.5 MG/5ML PO SYRP: 5.0000 mL | ORAL_SOLUTION | Freq: Three times a day (TID) | ORAL | Status: DC | PRN

## 2013-03-02 MED ORDER — BUDESONIDE-FORMOTEROL FUMARATE 160-4.5 MCG/ACT IN AERO: 2.0000 | INHALATION_SPRAY | Freq: Two times a day (BID) | RESPIRATORY_TRACT | Status: DC

## 2013-03-02 MED ORDER — METHYLPREDNISOLONE ACETATE 80 MG/ML IJ SUSP
80.0000 mg | Freq: Once | INTRAMUSCULAR | Status: AC
  Administered 2013-03-02: 80 mg via INTRAMUSCULAR

## 2013-03-02 NOTE — Progress Notes (Signed)
Patient ID: [name], female   DOB: 1934-03-18, 79 y.o.   MRN: [id number] SUBJECTIVE: CC: Chief Complaint  Patient presents with  . Cough    recurrence   HPI: Was seen by me some months ago for cough. It has never totally resolved. Has had additional visit and visits for relapses of cough and congestion. Wheezes on and off. Denies previous history of asthma. No chest pain.  Past Medical History  Diagnosis Date  . Nodule of right lung     Right upper lobe  . Hyperlipidemia   . Hypertension   . Hypothyroidism   . Renal insufficiency     Chronic  . Arthritis    Past Surgical History  Procedure Laterality Date  . Thyroidectomy, partial     History   Social History  . Marital Status: Married    Spouse Name: N/A    Number of Children: N/A  . Years of Education: N/A   Occupational History  . Not on file.   Social History Main Topics  . Smoking status: Never Smoker   . Smokeless tobacco: Not on file     Comment: Pt denies cigarettes  . Alcohol Use: No  . Drug Use: No  . Sexually Active: Not on file   Other Topics Concern  . Not on file   Social History Narrative  . No narrative on file   Family History  Problem Relation Age of Onset  . Thyroid disease Mother   . Stroke Father    Current Outpatient Prescriptions on File Prior to Visit  Medication Sig Dispense Refill  . aspirin 81 MG tablet Take 81 mg by mouth daily.        Marland Kitchen atenolol-chlorthalidone (TENORETIC) 50-25 MG per tablet Take 1 tablet by mouth daily.  90 tablet  1  . atorvastatin (LIPITOR) 10 MG tablet Take 10 mg by mouth daily.        . calcium-vitamin D (OSCAL WITH D) 500-200 MG-UNIT per tablet Take 1 tablet by mouth daily.        . cetirizine (ZYRTEC) 10 MG tablet Take 1 tablet (10 mg total) by mouth daily.  30 tablet  11  . levothyroxine (SYNTHROID, LEVOTHROID) 75 MCG tablet Take 1 tablet (75 mcg total) by mouth daily.  30 tablet  9  . Omega-3 350 MG CAPS Take 350 mg by mouth daily.         No  current facility-administered medications on file prior to visit.   Allergies  Allergen Reactions  . Sulfonamide Derivatives     There is no immunization history on file for this patient. Prior to Admission medications   Medication Sig Start Date End Date Taking? Authorizing Provider  amLODipine (NORVASC) 10 MG tablet TAKE 1 TABLET DAILY AS DIRECTED 03/02/13   Vernie Shanks, MD  aspirin 81 MG tablet Take 81 mg by mouth daily.      Historical Provider, MD  atenolol-chlorthalidone (TENORETIC) 50-25 MG per tablet Take 1 tablet by mouth daily. 01/13/13   Vernie Shanks, MD  atorvastatin (LIPITOR) 10 MG tablet Take 10 mg by mouth daily.      Historical Provider, MD  budesonide-formoterol (SYMBICORT) 160-4.5 MCG/ACT inhaler Inhale 2 puffs into the lungs 2 (two) times daily. 03/02/13   Vernie Shanks, MD  calcium-vitamin D (OSCAL WITH D) 500-200 MG-UNIT per tablet Take 1 tablet by mouth daily.      Historical Provider, MD  cetirizine (ZYRTEC) 10 MG tablet Take 1 tablet (10 mg  total) by mouth daily. 12/30/12   Vernie Shanks, MD  HYDROcodone-homatropine [facility]) 5-1.5 MG/5ML syrup Take 5 mLs by mouth every 8 (eight) hours as needed for cough. 03/02/13   Vernie Shanks, MD  levothyroxine (SYNTHROID, LEVOTHROID) 75 MCG tablet Take 1 tablet (75 mcg total) by mouth daily. 11/12/12   Chipper Herb, MD  montelukast (SINGULAIR) 10 MG tablet Take 1 tablet (10 mg total) by mouth at bedtime. 03/02/13   Vernie Shanks, MD  Omega-3 350 MG CAPS Take 350 mg by mouth daily.      Historical Provider, MD     ROS: As above in the HPI. All other systems are stable or negative.  OBJECTIVE: APPEARANCE:  Patient in no acute distress.The patient appeared well nourished and normally developed. Acyanotic. Waist: VITAL SIGNS:BP 159/77  Pulse 62  Temp(Src) 98.3 F (36.8 C)  Wt 143 lb (64.864 kg)  BMI 23.8 kg/m2 WF  SKIN: warm and  Dry without overt rashes, tattoos and scars  HEAD and Neck: without JVD, Head and  scalp: normal Eyes:No scleral icterus. Fundi normal, eye movements normal. Ears: Auricle normal, canal normal, Tympanic membranes normal, insufflation normal. Nose: normal Throat: normal Neck & thyroid: normal  CHEST & LUNGS: Chest wall: normal Lungs: rhonchi and end expiratory wheezes.no rales.  CVS: Reveals the PMI to be normally located. Regular rhythm, First and Second Heart sounds are normal,  absence of murmurs, rubs or gallops. Peripheral vasculature: Radial pulses: normal Dorsal pedis pulses: normal Posterior pulses: normal  ABDOMEN:  Appearance: normal Benign, no organomegaly, no masses, no Abdominal Aortic enlargement. No Guarding , no rebound. No Bruits. Bowel sounds: normal  RECTAL: N/A GU: N/A  EXTREMETIES: nonedematous. Both Femoral and Pedal pulses are normal.  MUSCULOSKELETAL:  Spine: normal Joints: intact  NEUROLOGIC: oriented to time,place and person; nonfocal. Strength is normal Sensory is normal Reflexes are normal Cranial Nerves are normal.  ASSESSMENT: Asthmatic bronchitis, unspecified asthma severity, uncomplicated - Plan: budesonide-formoterol (SYMBICORT) 160-4.5 MCG/ACT inhaler, HYDROcodone-homatropine (HYCODAN) 5-1.5 MG/5ML syrup, montelukast (SINGULAIR) 10 MG tablet, methylPREDNISolone acetate (DEPO-MEDROL) injection 80 mg  Cough - Plan: HYDROcodone-homatropine (HYCODAN) 5-1.5 MG/5ML syrup, methylPREDNISolone acetate (DEPO-MEDROL) injection 80 mg  HTN (hypertension) - Plan: amLODipine (NORVASC) 10 MG tablet  HLD (hyperlipidemia)  Unspecified hypothyroidism  PLAN: Results for orders placed in visit on [id number]  BASIC METABOLIC PANEL WITH GFR      Result Value Range   Sodium 137  135 - 145 mEq/L   Potassium 3.8  3.5 - 5.3 mEq/L   Chloride 94 (*) 96 - 112 mEq/L   CO2 36 (*) 19 - 32 mEq/L   Glucose, Bld 87  70 - 99 mg/dL   BUN 20  6 - 23 mg/dL   Creat 1.37 (*) 0.50 - 1.10 mg/dL   Calcium 9.6  8.4 - 10.5 mg/dL   GFR, Est African  American 43 (*)    GFR, Est Non African American 37 (*)    Meds ordered this encounter  Medications  . budesonide-formoterol (SYMBICORT) 160-4.5 MCG/ACT inhaler    Sig: Inhale 2 puffs into the lungs 2 (two) times daily.    Dispense:  1 Inhaler    Refill:  2  . HYDROcodone-homatropine (HYCODAN) 5-1.5 MG/5ML syrup    Sig: Take 5 mLs by mouth every 8 (eight) hours as needed for cough.    Dispense:  120 mL    Refill:  0    Order Specific Question:  Supervising Provider    Answer:  Laurance Flatten,  DONALD W [[id number]]  . montelukast (SINGULAIR) 10 MG tablet    Sig: Take 1 tablet (10 mg total) by mouth at bedtime.    Dispense:  30 tablet    Refill:  3  . amLODipine (NORVASC) 10 MG tablet    Sig: TAKE 1 TABLET DAILY AS DIRECTED    Dispense:  30 tablet    Refill:  5  . methylPREDNISolone acetate (DEPO-MEDROL) injection 80 mg    Sig:     Discussed asthma.  Return in about 2 weeks (around 03/16/2013) for Recheck medical problems.  Kelty Szafran P. Jacelyn Grip, M.D.

## 2013-03-21 ENCOUNTER — Ambulatory Visit (INDEPENDENT_AMBULATORY_CARE_PROVIDER_SITE_OTHER): Payer: Medicare Other | Admitting: Family Medicine

## 2013-03-21 ENCOUNTER — Other Ambulatory Visit: Payer: Self-pay | Admitting: Nurse Practitioner

## 2013-03-21 ENCOUNTER — Encounter: Payer: Self-pay | Admitting: Family Medicine

## 2013-03-21 VITALS — BP 165/72 | HR 52 | Temp 97.3°F | Ht 66.0 in | Wt 144.8 lb

## 2013-03-21 DIAGNOSIS — E785 Hyperlipidemia, unspecified: Secondary | ICD-10-CM

## 2013-03-21 DIAGNOSIS — I10 Essential (primary) hypertension: Secondary | ICD-10-CM

## 2013-03-21 DIAGNOSIS — J4521 Mild intermittent asthma with (acute) exacerbation: Secondary | ICD-10-CM

## 2013-03-21 DIAGNOSIS — J45901 Unspecified asthma with (acute) exacerbation: Secondary | ICD-10-CM

## 2013-03-21 NOTE — Progress Notes (Signed)
Patient ID: [name], female   DOB: 1933-10-31, 79 y.o.   MRN: [id number] SUBJECTIVE: CC: Chief Complaint  Patient presents with  . Follow-up    2 week follow up  bronchitis feeling alot better    HPI: Breathing well doing great. Got better with the inhalers and the singulair and did not need to get the cough medication. No wheezes.  Past Medical History  Diagnosis Date  . Nodule of right lung     Right upper lobe  . Hyperlipidemia   . Hypertension   . Hypothyroidism   . Renal insufficiency     Chronic  . Arthritis    Past Surgical History  Procedure Laterality Date  . Thyroidectomy, partial     History   Social History  . Marital Status: Married    Spouse Name: N/A    Number of Children: N/A  . Years of Education: N/A   Occupational History  . Not on file.   Social History Main Topics  . Smoking status: Never Smoker   . Smokeless tobacco: Not on file     Comment: Pt denies cigarettes  . Alcohol Use: No  . Drug Use: No  . Sexually Active: Not on file   Other Topics Concern  . Not on file   Social History Narrative  . No narrative on file   Family History  Problem Relation Age of Onset  . Thyroid disease Mother   . Stroke Father    Current Outpatient Prescriptions on File Prior to Visit  Medication Sig Dispense Refill  . amLODipine (NORVASC) 10 MG tablet TAKE 1 TABLET DAILY AS DIRECTED  30 tablet  5  . aspirin 81 MG tablet Take 81 mg by mouth daily.        Marland Kitchen atenolol-chlorthalidone (TENORETIC) 50-25 MG per tablet Take 1 tablet by mouth daily.  90 tablet  1  . atorvastatin (LIPITOR) 10 MG tablet Take 10 mg by mouth daily.        . budesonide-formoterol (SYMBICORT) 160-4.5 MCG/ACT inhaler Inhale 2 puffs into the lungs 2 (two) times daily.  1 Inhaler  2  . calcium-vitamin D (OSCAL WITH D) 500-200 MG-UNIT per tablet Take 1 tablet by mouth daily.        . cetirizine (ZYRTEC) 10 MG tablet Take 1 tablet (10 mg total) by mouth daily.  30 tablet  11  .  levothyroxine (SYNTHROID, LEVOTHROID) 75 MCG tablet Take 1 tablet (75 mcg total) by mouth daily.  30 tablet  9  . montelukast (SINGULAIR) 10 MG tablet Take 1 tablet (10 mg total) by mouth at bedtime.  30 tablet  3  . Omega-3 350 MG CAPS Take 350 mg by mouth daily.        Marland Kitchen HYDROcodone-homatropine (HYCODAN) 5-1.5 MG/5ML syrup Take 5 mLs by mouth every 8 (eight) hours as needed for cough.  120 mL  0   No current facility-administered medications on file prior to visit.   Allergies  Allergen Reactions  . Sulfonamide Derivatives     There is no immunization history on file for this patient. Prior to Admission medications   Medication Sig Start Date End Date Taking? Authorizing Provider  amLODipine (NORVASC) 10 MG tablet TAKE 1 TABLET DAILY AS DIRECTED 03/02/13  Yes Vernie Shanks, MD  aspirin 81 MG tablet Take 81 mg by mouth daily.     Yes Historical Provider, MD  atenolol-chlorthalidone (TENORETIC) 50-25 MG per tablet Take 1 tablet by mouth daily. 01/13/13  Yes Dub Mikes  Darylene Price, MD  atorvastatin (LIPITOR) 10 MG tablet Take 10 mg by mouth daily.     Yes Historical Provider, MD  budesonide-formoterol (SYMBICORT) 160-4.5 MCG/ACT inhaler Inhale 2 puffs into the lungs 2 (two) times daily. 03/02/13  Yes Vernie Shanks, MD  calcium-vitamin D (OSCAL WITH D) 500-200 MG-UNIT per tablet Take 1 tablet by mouth daily.     Yes Historical Provider, MD  cetirizine (ZYRTEC) 10 MG tablet Take 1 tablet (10 mg total) by mouth daily. 12/30/12  Yes Vernie Shanks, MD  levothyroxine (SYNTHROID, LEVOTHROID) 75 MCG tablet Take 1 tablet (75 mcg total) by mouth daily. 11/12/12  Yes Chipper Herb, MD  montelukast (SINGULAIR) 10 MG tablet Take 1 tablet (10 mg total) by mouth at bedtime. 03/02/13  Yes Vernie Shanks, MD  Omega-3 350 MG CAPS Take 350 mg by mouth daily.     Yes Historical Provider, MD  HYDROcodone-homatropine (HYCODAN) 5-1.5 MG/5ML syrup Take 5 mLs by mouth every 8 (eight) hours as needed for cough. 03/02/13    Vernie Shanks, MD     ROS: As above in the HPI. All other systems are stable or negative.  OBJECTIVE: APPEARANCE:  Patient in no acute distress.The patient appeared well nourished and normally developed. Acyanotic. Waist: VITAL SIGNS:BP 165/72  Pulse 52  Temp(Src) 97.3 F (36.3 C) (Oral)  Ht 5\' 6"  (1.676 m)  Wt 144 lb 12.8 oz (65.681 kg)  BMI 23.38 kg/m2\  SKIN: warm and  Dry without overt rashes, tattoos and scars  HEAD and Neck: without JVD, Head and scalp: normal Eyes:No scleral icterus. Fundi normal, eye movements normal. Ears: Auricle normal, canal normal, Tympanic membranes normal, insufflation normal. Nose: normal Throat: normal Neck & thyroid: normal  CHEST & LUNGS: Chest wall: normal Lungs: Clear  CVS: Reveals the PMI to be normally located. Regular rhythm, First and Second Heart sounds are normal,  absence of murmurs, rubs or gallops. Peripheral vasculature: Radial pulses: normal Dorsal pedis pulses: normal Posterior pulses: normal  ABDOMEN:  Appearance: normal Benign, no organomegaly, no masses, no Abdominal Aortic enlargement. No Guarding , no rebound. No Bruits. Bowel sounds: normal  RECTAL: N/A GU: N/A  EXTREMETIES: nonedematous. Both Femoral and Pedal pulses are normal.  MUSCULOSKELETAL:  Spine: normal Joints: intact  NEUROLOGIC: oriented to time,place and person; nonfocal. Strength is normal Sensory is normal Reflexes are normal Cranial Nerves are normal.  ASSESSMENT: Asthmatic bronchitis with exacerbation, mild intermittent - better  HTN (hypertension)  HLD (hyperlipidemia)  Better.  PLAN: Continue with the symbicort. Reduce to 2 puffs once daily and 1 week before follow up d/c teh symbicort. Gave patient another sample.  Stay on teh [facility].  Return for has routine follow up in September.Dub Mikes P. Jacelyn Grip, M.D.

## 2013-03-22 NOTE — Telephone Encounter (Signed)
Seen yesterday but do not see on current med list. Please advise

## 2013-04-19 ENCOUNTER — Encounter: Payer: Self-pay | Admitting: Family Medicine

## 2013-04-19 ENCOUNTER — Ambulatory Visit: Payer: Self-pay | Admitting: Family Medicine

## 2013-04-19 ENCOUNTER — Ambulatory Visit (INDEPENDENT_AMBULATORY_CARE_PROVIDER_SITE_OTHER): Payer: Medicare Other | Admitting: Family Medicine

## 2013-04-19 VITALS — BP 147/67 | HR 89 | Temp 97.5°F | Wt 146.8 lb

## 2013-04-19 DIAGNOSIS — M129 Arthropathy, unspecified: Secondary | ICD-10-CM

## 2013-04-19 DIAGNOSIS — E039 Hypothyroidism, unspecified: Secondary | ICD-10-CM

## 2013-04-19 DIAGNOSIS — R9431 Abnormal electrocardiogram [ECG] [EKG]: Secondary | ICD-10-CM

## 2013-04-19 DIAGNOSIS — I10 Essential (primary) hypertension: Secondary | ICD-10-CM

## 2013-04-19 DIAGNOSIS — N189 Chronic kidney disease, unspecified: Secondary | ICD-10-CM

## 2013-04-19 DIAGNOSIS — E785 Hyperlipidemia, unspecified: Secondary | ICD-10-CM

## 2013-04-19 DIAGNOSIS — J45909 Unspecified asthma, uncomplicated: Secondary | ICD-10-CM

## 2013-04-19 LAB — POCT CBC
Granulocyte percent: 50.9 %G (ref 37–80)
HCT, POC: 37 % — AB (ref 37.7–47.9)
Hemoglobin: 12.2 g/dL (ref 12.2–16.2)
Lymph, poc: 1.5 (ref 0.6–3.4)
MCH, POC: 28.8 pg (ref 27–31.2)
MCHC: 32.9 g/dL (ref 31.8–35.4)
MCV: 87.5 fL (ref 80–97)
MPV: 6.5 fL (ref 0–99.8)
POC Granulocyte: 1.7 — AB (ref 2–6.9)
POC LYMPH PERCENT: 44.2 %L (ref 10–50)
Platelet Count, POC: 139 10*3/uL — AB (ref 142–424)
RBC: 4.2 M/uL (ref 4.04–5.48)
RDW, POC: 13.4 %
WBC: 3.4 10*3/uL — AB (ref 4.6–10.2)

## 2013-04-19 NOTE — Progress Notes (Signed)
Patient ID: [name], female   DOB: [birth year], 79 y.o.   MRN: [id number] SUBJECTIVE: CC: Chief Complaint  Patient presents with  . Follow-up    reck bp fasting for labs refill bp med     HPI: Breathing is good. No wheezing , no coughing, no SOB but has baseline exertional deconditioning.  Patient is here for follow up of hyperlipidemia/HTN: denies Headache;denies Chest Pain;denies weakness;denies any new Shortness of Breath and orthopnea;denies Visual changes;denies palpitations;denies cough; pedal edema with heat ;denies symptoms of TIA or stroke;deniesClaudication symptoms. admits to Compliance with medications; denies Problems with medications.  Doesn't have a follow up with Dr Percival Spanish because in the past the cardiac  Catheterization was fine.  Past Medical History  Diagnosis Date  . Nodule of right lung     Right upper lobe  . Hyperlipidemia   . Hypertension   . Hypothyroidism   . Renal insufficiency     Chronic  . Arthritis    Past Surgical History  Procedure Laterality Date  . Thyroidectomy, partial     History   Social History  . Marital Status: Married    Spouse Name: N/A    Number of Children: N/A  . Years of Education: N/A   Occupational History  . Not on file.   Social History Main Topics  . Smoking status: Never Smoker   . Smokeless tobacco: Not on file     Comment: Pt denies cigarettes  . Alcohol Use: No  . Drug Use: No  . Sexual Activity: Not on file   Other Topics Concern  . Not on file   Social History Narrative  . No narrative on file   Family History  Problem Relation Age of Onset  . Thyroid disease Mother   . Stroke Father    Current Outpatient Prescriptions on File Prior to Visit  Medication Sig Dispense Refill  . amLODipine (NORVASC) 10 MG tablet TAKE 1 TABLET DAILY AS DIRECTED  30 tablet  5  . aspirin 81 MG tablet Take 81 mg by mouth daily.        Marland Kitchen atenolol-chlorthalidone (TENORETIC) 50-25 MG per tablet Take 1 tablet by  mouth daily.  90 tablet  1  . atorvastatin (LIPITOR) 10 MG tablet Take 10 mg by mouth daily.        . budesonide-formoterol (SYMBICORT) 160-4.5 MCG/ACT inhaler Inhale 2 puffs into the lungs 2 (two) times daily.  1 Inhaler  2  . calcium-vitamin D (OSCAL WITH D) 500-200 MG-UNIT per tablet Take 1 tablet by mouth daily.        . cetirizine (ZYRTEC) 10 MG tablet Take 1 tablet (10 mg total) by mouth daily.  30 tablet  11  . levothyroxine (SYNTHROID, LEVOTHROID) 75 MCG tablet Take 1 tablet (75 mcg total) by mouth daily.  30 tablet  9  . montelukast (SINGULAIR) 10 MG tablet Take 1 tablet (10 mg total) by mouth at bedtime.  30 tablet  3  . Omega-3 350 MG CAPS Take 350 mg by mouth daily.         No current facility-administered medications on file prior to visit.   Allergies  Allergen Reactions  . Sulfonamide Derivatives     There is no immunization history on file for this patient. Prior to Admission medications   Medication Sig Start Date End Date Taking? Authorizing Provider  amLODipine (NORVASC) 10 MG tablet TAKE 1 TABLET DAILY AS DIRECTED 03/02/13  Yes Vernie Shanks, MD  aspirin 81 MG tablet  Take 81 mg by mouth daily.     Yes Historical Provider, MD  atenolol-chlorthalidone (TENORETIC) 50-25 MG per tablet Take 1 tablet by mouth daily. 01/13/13  Yes Vernie Shanks, MD  atorvastatin (LIPITOR) 10 MG tablet Take 10 mg by mouth daily.     Yes Historical Provider, MD  budesonide-formoterol (SYMBICORT) 160-4.5 MCG/ACT inhaler Inhale 2 puffs into the lungs 2 (two) times daily. 03/02/13  Yes Vernie Shanks, MD  calcium-vitamin D (OSCAL WITH D) 500-200 MG-UNIT per tablet Take 1 tablet by mouth daily.     Yes Historical Provider, MD  cetirizine (ZYRTEC) 10 MG tablet Take 1 tablet (10 mg total) by mouth daily. 12/30/12  Yes Vernie Shanks, MD  levothyroxine (SYNTHROID, LEVOTHROID) 75 MCG tablet Take 1 tablet (75 mcg total) by mouth daily. 11/12/12  Yes Chipper Herb, MD  montelukast (SINGULAIR) 10 MG tablet  Take 1 tablet (10 mg total) by mouth at bedtime. 03/02/13  Yes Vernie Shanks, MD  Omega-3 350 MG CAPS Take 350 mg by mouth daily.     Yes Historical Provider, MD     ROS: As above in the HPI. All other systems are stable or negative.  OBJECTIVE: APPEARANCE:  Patient in no acute distress.The patient appeared well nourished and normally developed. Acyanotic. Waist: VITAL SIGNS:BP 147/67  Pulse 89  Temp(Src) 97.5 F (36.4 C) (Oral)  Wt 146 lb 12.8 oz (66.588 kg)  BMI 23.71 kg/m2  WF BPs at home patient brought readings has been good.  SKIN: warm and  Dry without overt rashes, tattoos and scars  HEAD and Neck: without JVD, Head and scalp: normal Eyes:No scleral icterus. Fundi normal, eye movements normal. Ears: Auricle normal, canal normal, Tympanic membranes normal, insufflation normal. Nose: normal Throat: normal Neck & thyroid: normal  CHEST & LUNGS: Chest wall: normal Lungs: Clear  CVS: Reveals the PMI to be normally located. Regular rhythm, First and Second Heart sounds are normal,  absence of murmurs, rubs or gallops. Peripheral vasculature: Radial pulses: normal Dorsal pedis pulses: normal Posterior pulses: normal  ABDOMEN:  Appearance: normal Benign, no organomegaly, no masses, no Abdominal Aortic enlargement. No Guarding , no rebound. No Bruits. Bowel sounds: normal  RECTAL: N/A GU: N/A  EXTREMETIES: nonedematous.  MUSCULOSKELETAL:  Spine: normal Joints: intact  NEUROLOGIC: oriented to time,place and person; nonfocal. Strength is normal Sensory is normal Reflexes are normal Cranial Nerves are normal.  Results for orders placed in visit on 04/19/13  POCT CBC      Result Value Range   WBC 3.4 (*) 4.6 - 10.2 K/uL   Lymph, poc 1.5  0.6 - 3.4   POC LYMPH PERCENT 44.2  10 - 50 %L   POC Granulocyte 1.7 (*) 2 - 6.9   Granulocyte percent 50.9  37 - 80 %G   RBC 4.2  4.04 - 5.48 M/uL   Hemoglobin 12.2  12.2 - 16.2 g/dL   HCT, POC 37.0 (*) 37.7 -  47.9 %   MCV 87.5  80 - 97 fL   MCH, POC 28.8  27 - 31.2 pg   MCHC 32.9  31.8 - 35.4 g/dL   RDW, POC 13.4     Platelet Count, POC 139.0 (*) 142 - 424 K/uL   MPV 6.5  0 - 99.8 fL    ASSESSMENT: HYPERTENSION - Plan: CMP14+EGFR  HYPOTHYROIDISM - Plan: TSH  RENAL INSUFFICIENCY, CHRONIC - Plan: CMP14+EGFR, POCT UA - Microalbumin, POCT CBC  HYPERLIPIDEMIA - Plan: CMP14+EGFR, NMR, lipoprofile  Asthmatic bronchitis  ABNORMAL STRESS ELECTROCARDIOGRAM  ARTHRITIS  PLAN:  Orders Placed This Encounter  Procedures  . CMP14+EGFR  . NMR, lipoprofile  . TSH  . POCT UA - Microalbumin  . POCT CBC  keep active and eat a balanced healthy diet. No change in medications.  Return in about 3 months (around 07/19/2013) for Recheck medical problems.  Yechiel Erny P. Jacelyn Grip, M.D.

## 2013-04-21 LAB — NMR, LIPOPROFILE
Cholesterol: 165 mg/dL (ref <200)
HDL Cholesterol by NMR: 52 mg/dL (ref >=40)
HDL Particle Number: 33.2 umol/L (ref >=30.5)
LDL Particle Number: 1358 nmol/L — ABNORMAL HIGH (ref <1000)
LDL Size: 20.9 nm (ref >20.5)
LDLC SERPL CALC-MCNC: 83 mg/dL (ref <100)
LP-IR Score: 46 — ABNORMAL HIGH (ref <=45)
Small LDL Particle Number: 694 nmol/L — ABNORMAL HIGH (ref <=527)
Triglycerides by NMR: 148 mg/dL (ref <150)

## 2013-04-21 LAB — CMP14+EGFR
ALT: 19 IU/L (ref 0–32)
AST: 23 IU/L (ref 0–40)
Albumin/Globulin Ratio: 2.2 (ref 1.1–2.5)
Albumin: 4.6 g/dL (ref 3.5–4.8)
Alkaline Phosphatase: 64 IU/L (ref 39–117)
BUN/Creatinine Ratio: 15 (ref 11–26)
BUN: 23 mg/dL (ref 8–27)
CO2: 32 mmol/L — ABNORMAL HIGH (ref 18–29)
Calcium: 9.3 mg/dL (ref 8.6–10.2)
Chloride: 98 mmol/L (ref 97–108)
Creatinine, Ser: 1.55 mg/dL — ABNORMAL HIGH (ref 0.57–1.00)
GFR calc Af Amer: 37 mL/min/{1.73_m2} — ABNORMAL LOW (ref >59)
GFR calc non Af Amer: 32 mL/min/{1.73_m2} — ABNORMAL LOW (ref >59)
Globulin, Total: 2.1 g/dL (ref 1.5–4.5)
Glucose: 101 mg/dL — ABNORMAL HIGH (ref 65–99)
Potassium: 3.8 mmol/L (ref 3.5–5.2)
Sodium: 142 mmol/L (ref 134–144)
Total Bilirubin: 0.6 mg/dL (ref 0.0–1.2)
Total Protein: 6.7 g/dL (ref 6.0–8.5)

## 2013-04-21 LAB — TSH: TSH: 3.03 u[IU]/mL (ref 0.450–4.500)

## 2013-04-27 ENCOUNTER — Other Ambulatory Visit: Payer: Self-pay | Admitting: Family Medicine

## 2013-04-27 MED ORDER — ATORVASTATIN CALCIUM 20 MG PO TABS: 20.0000 mg | ORAL_TABLET | Freq: Every day | ORAL | Status: DC

## 2013-04-29 ENCOUNTER — Telehealth: Payer: Self-pay | Admitting: Family Medicine

## 2013-04-29 NOTE — Telephone Encounter (Signed)
Pt aware of labs Copy mailed at her request of her labs

## 2013-05-02 ENCOUNTER — Encounter: Payer: Self-pay | Admitting: *Deleted

## 2013-05-03 ENCOUNTER — Encounter: Payer: Self-pay | Admitting: Family Medicine

## 2013-05-03 ENCOUNTER — Ambulatory Visit (INDEPENDENT_AMBULATORY_CARE_PROVIDER_SITE_OTHER): Payer: Medicare Other | Admitting: Family Medicine

## 2013-05-03 VITALS — BP 147/69 | HR 63 | Temp 97.9°F | Ht 65.0 in | Wt 149.2 lb

## 2013-05-03 DIAGNOSIS — T887XXA Unspecified adverse effect of drug or medicament, initial encounter: Secondary | ICD-10-CM | POA: Insufficient documentation

## 2013-05-03 DIAGNOSIS — R600 Localized edema: Secondary | ICD-10-CM

## 2013-05-03 DIAGNOSIS — I10 Essential (primary) hypertension: Secondary | ICD-10-CM

## 2013-05-03 DIAGNOSIS — R609 Edema, unspecified: Secondary | ICD-10-CM

## 2013-05-03 DIAGNOSIS — T50905A Adverse effect of unspecified drugs, medicaments and biological substances, initial encounter: Secondary | ICD-10-CM

## 2013-05-03 DIAGNOSIS — I83893 Varicose veins of bilateral lower extremities with other complications: Secondary | ICD-10-CM | POA: Insufficient documentation

## 2013-05-03 MED ORDER — AMLODIPINE BESYLATE 5 MG PO TABS: ORAL_TABLET | ORAL | Status: DC

## 2013-05-03 MED ORDER — CLONIDINE HCL 0.1 MG PO TABS: 0.1000 mg | ORAL_TABLET | Freq: Two times a day (BID) | ORAL | Status: DC

## 2013-05-03 NOTE — Progress Notes (Signed)
Patient ID: [name], female   DOB: 10-30-1933, 79 y.o.   MRN: [id number] SUBJECTIVE: CC: Chief Complaint  Patient presents with  . Acute Visit    states in mornings her feet and legs are not swollen but on up in day notictes swelling feet ankles     HPI: Swelling of the feet by afternoon. Has been significant recenntly. Recently seen and was doing well. Thinks it is due to the medication. No pains no SOB, no palpitations.  Past Medical History  Diagnosis Date  . Nodule of right lung     Right upper lobe  . Hyperlipidemia   . Hypertension   . Hypothyroidism   . Renal insufficiency     Chronic  . Arthritis    Past Surgical History  Procedure Laterality Date  . Thyroidectomy, partial     History   Social History  . Marital Status: Married    Spouse Name: N/A    Number of Children: N/A  . Years of Education: N/A   Occupational History  . Not on file.   Social History Main Topics  . Smoking status: Never Smoker   . Smokeless tobacco: Not on file     Comment: Pt denies cigarettes  . Alcohol Use: No  . Drug Use: No  . Sexual Activity: Not on file   Other Topics Concern  . Not on file   Social History Narrative  . No narrative on file   Family History  Problem Relation Age of Onset  . Thyroid disease Mother   . Stroke Father    Current Outpatient Prescriptions on File Prior to Visit  Medication Sig Dispense Refill  . aspirin 81 MG tablet Take 81 mg by mouth daily.        Marland Kitchen atenolol-chlorthalidone (TENORETIC) 50-25 MG per tablet Take 1 tablet by mouth daily.  90 tablet  1  . atorvastatin (LIPITOR) 20 MG tablet Take 1 tablet (20 mg total) by mouth daily.  30 tablet  5  . calcium-vitamin D (OSCAL WITH D) 500-200 MG-UNIT per tablet Take 1 tablet by mouth daily.        . cetirizine (ZYRTEC) 10 MG tablet Take 1 tablet (10 mg total) by mouth daily.  30 tablet  11  . levothyroxine (SYNTHROID, LEVOTHROID) 75 MCG tablet Take 1 tablet (75 mcg total) by mouth daily.   30 tablet  9  . montelukast (SINGULAIR) 10 MG tablet Take 1 tablet (10 mg total) by mouth at bedtime.  30 tablet  3  . Omega-3 350 MG CAPS Take 350 mg by mouth daily.        . budesonide-formoterol (SYMBICORT) 160-4.5 MCG/ACT inhaler Inhale 2 puffs into the lungs 2 (two) times daily.  1 Inhaler  2   No current facility-administered medications on file prior to visit.   Allergies  Allergen Reactions  . Sulfonamide Derivatives     There is no immunization history on file for this patient. Prior to Admission medications   Medication Sig Start Date End Date Taking? Authorizing Provider  amLODipine (NORVASC) 5 MG tablet TAKE 1 TABLET DAILY AS DIRECTED 05/03/13  Yes Vernie Shanks, MD  aspirin 81 MG tablet Take 81 mg by mouth daily.     Yes Historical Provider, MD  atenolol-chlorthalidone (TENORETIC) 50-25 MG per tablet Take 1 tablet by mouth daily. 01/13/13  Yes Vernie Shanks, MD  atorvastatin (LIPITOR) 20 MG tablet Take 1 tablet (20 mg total) by mouth daily. 04/27/13  Yes Zephan Beauchaine P  Jacelyn Grip, MD  calcium-vitamin D (OSCAL WITH D) 500-200 MG-UNIT per tablet Take 1 tablet by mouth daily.     Yes Historical Provider, MD  cetirizine (ZYRTEC) 10 MG tablet Take 1 tablet (10 mg total) by mouth daily. 12/30/12  Yes Vernie Shanks, MD  levothyroxine (SYNTHROID, LEVOTHROID) 75 MCG tablet Take 1 tablet (75 mcg total) by mouth daily. 11/12/12  Yes Chipper Herb, MD  montelukast (SINGULAIR) 10 MG tablet Take 1 tablet (10 mg total) by mouth at bedtime. 03/02/13  Yes Vernie Shanks, MD  Omega-3 350 MG CAPS Take 350 mg by mouth daily.     Yes Historical Provider, MD  budesonide-formoterol (SYMBICORT) 160-4.5 MCG/ACT inhaler Inhale 2 puffs into the lungs 2 (two) times daily. 03/02/13   Vernie Shanks, MD  cloNIDine (CATAPRES) 0.1 MG tablet Take 1 tablet (0.1 mg total) by mouth 2 (two) times daily. 05/03/13   Vernie Shanks, MD     ROS: As above in the HPI. All other systems are stable or  negative.  OBJECTIVE: APPEARANCE:  Patient in no acute distress.The patient appeared well nourished and normally developed. Acyanotic. Waist: VITAL SIGNS:BP 147/69  Pulse 63  Temp(Src) 97.9 F (36.6 C) (Oral)  Ht 5\' 5"  (1.651 m)  Wt 149 lb 3.2 oz (67.677 kg)  BMI 24.83 kg/m2  WF SKIN: warm and  Dry without overt rashes, tattoos and scars  HEAD and Neck: without JVD, Head and scalp: normal Eyes:No scleral icterus. Fundi normal, eye movements normal. Ears: Auricle normal, canal normal, Tympanic membranes normal, insufflation normal. Nose: normal Throat: normal Neck & thyroid: normal  CHEST & LUNGS: Chest wall: normal Lungs: Clear  CVS: Reveals the PMI to be normally located. Regular rhythm, First and Second Heart sounds are normal,  absence of murmurs, rubs or gallops. Peripheral vasculature: Radial pulses: normal Dorsal pedis pulses: normal Posterior pulses: normal  ABDOMEN:  Appearance: normal Benign, no organomegaly, no masses, no Abdominal Aortic enlargement. No Guarding , no rebound. No Bruits. Bowel sounds: normal  RECTAL: N/A GU: N/A  EXTREMETIES: 1+ edema bilaterally Peripheral veins prominent with reticular veins , fan shaped distribution . And varicosities.  MUSCULOSKELETAL:  Spine: normal Joints: intact  NEUROLOGIC: oriented to time,place and person; nonfocal.  ASSESSMENT: HTN (hypertension) - Plan: amLODipine (NORVASC) 5 MG tablet, cloNIDine (CATAPRES) 0.1 MG tablet  Varicose veins of lower extremities with other complications  Pedal edema  Medication side effect, initial encounter  Discussed with patient the pedal edema is a combination of the amlodipine and varicose veins.  PLAN: Recommend compression stocking: patient declined and prefers medication changes and see the outcome.  Meds ordered this encounter  Medications  . amLODipine (NORVASC) 5 MG tablet    Sig: TAKE 1 TABLET DAILY AS DIRECTED    Dispense:  30 tablet    Refill:   5  . cloNIDine (CATAPRES) 0.1 MG tablet    Sig: Take 1 tablet (0.1 mg total) by mouth 2 (two) times daily.    Dispense:  60 tablet    Refill:  5    Medications Discontinued During This Encounter  Medication Reason  . amLODipine (NORVASC) 10 MG tablet Reorder    Return in about 4 weeks (around 05/31/2013) for recheck BP, Recheck medical problems.  Mily Malecki P. Jacelyn Grip, M.D.

## 2013-05-09 ENCOUNTER — Telehealth: Payer: Self-pay | Admitting: Family Medicine

## 2013-05-09 NOTE — Telephone Encounter (Signed)
The clonidine side effect is sleepiness. This was to reduce the amlodipine. OV tomorrow. Or this pm with Dr Laurance Flatten.

## 2013-05-09 NOTE — Telephone Encounter (Signed)
Duplicate message. 

## 2013-05-09 NOTE — Telephone Encounter (Signed)
Pt says she feels terrible.  She feels weak in her hips and legs and lays around all day feeling sleepy.  She still has the swelling also.     Do you think this is from the change in blood pressure medications or maybe the Lipitor?  What should she do?

## 2013-05-09 NOTE — Telephone Encounter (Signed)
Spoke with pt appt tomorrow declined today visit

## 2013-05-10 ENCOUNTER — Ambulatory Visit (INDEPENDENT_AMBULATORY_CARE_PROVIDER_SITE_OTHER): Payer: Medicare Other | Admitting: Family Medicine

## 2013-05-10 ENCOUNTER — Encounter: Payer: Self-pay | Admitting: Family Medicine

## 2013-05-10 VITALS — BP 137/72 | HR 58 | Temp 97.6°F | Ht 66.0 in | Wt 151.0 lb

## 2013-05-10 DIAGNOSIS — E039 Hypothyroidism, unspecified: Secondary | ICD-10-CM

## 2013-05-10 DIAGNOSIS — I10 Essential (primary) hypertension: Secondary | ICD-10-CM

## 2013-05-10 DIAGNOSIS — M129 Arthropathy, unspecified: Secondary | ICD-10-CM

## 2013-05-10 DIAGNOSIS — R600 Localized edema: Secondary | ICD-10-CM

## 2013-05-10 DIAGNOSIS — E785 Hyperlipidemia, unspecified: Secondary | ICD-10-CM

## 2013-05-10 DIAGNOSIS — N189 Chronic kidney disease, unspecified: Secondary | ICD-10-CM

## 2013-05-10 DIAGNOSIS — R609 Edema, unspecified: Secondary | ICD-10-CM

## 2013-05-10 DIAGNOSIS — I83893 Varicose veins of bilateral lower extremities with other complications: Secondary | ICD-10-CM

## 2013-05-10 MED ORDER — TERAZOSIN HCL 2 MG PO CAPS: 2.0000 mg | ORAL_CAPSULE | Freq: Every day | ORAL | Status: DC

## 2013-05-10 NOTE — Progress Notes (Signed)
Patient ID: [name], female   DOB: [birth year], 79 y.o.   MRN: [id number] SUBJECTIVE: CC: Chief Complaint  Patient presents with  . Follow-up    concerned about swelling in feet and ankles.states dont feel well since starting the increase the lipitor     HPI: Here for follow up of the pedal edema. Legs are fine today. It has been 1 week since the reduction of the amlodipine to 5 mg. Also,clonidine was added and the medication makes her so drowsy that she cannot get out of the bed. And it makes her so fatigue that she feels weak.  Patient is here for follow up of hyperlipidemia/htn/hypothyroid/vit d deficiency denies Headache;denies Chest Pain;denies weakness;denies Shortness of Breath and orthopnea;denies Visual changes;denies palpitations;denies cough The pedal edema is better. denies symptoms of TIA or stroke;deniesClaudication symptoms. admits to Compliance with medications; denies Problems with medications.  Past Medical History  Diagnosis Date  . Nodule of right lung     Right upper lobe  . Hyperlipidemia   . Hypertension   . Hypothyroidism   . Renal insufficiency     Chronic  . Arthritis    Past Surgical History  Procedure Laterality Date  . Thyroidectomy, partial     History   Social History  . Marital Status: Married    Spouse Name: N/A    Number of Children: N/A  . Years of Education: N/A   Occupational History  . Not on file.   Social History Main Topics  . Smoking status: Never Smoker   . Smokeless tobacco: Not on file     Comment: Pt denies cigarettes  . Alcohol Use: No  . Drug Use: No  . Sexual Activity: Not on file   Other Topics Concern  . Not on file   Social History Narrative  . No narrative on file   Family History  Problem Relation Age of Onset  . Thyroid disease Mother   . Stroke Father    Current Outpatient Prescriptions on File Prior to Visit  Medication Sig Dispense Refill  . amLODipine (NORVASC) 5 MG tablet TAKE 1 TABLET  DAILY AS DIRECTED  30 tablet  5  . aspirin 81 MG tablet Take 81 mg by mouth daily.        Marland Kitchen atenolol-chlorthalidone (TENORETIC) 50-25 MG per tablet Take 1 tablet by mouth daily.  90 tablet  1  . budesonide-formoterol (SYMBICORT) 160-4.5 MCG/ACT inhaler Inhale 2 puffs into the lungs 2 (two) times daily.  1 Inhaler  2  . calcium-vitamin D (OSCAL WITH D) 500-200 MG-UNIT per tablet Take 1 tablet by mouth daily.        . cetirizine (ZYRTEC) 10 MG tablet Take 1 tablet (10 mg total) by mouth daily.  30 tablet  11  . levothyroxine (SYNTHROID, LEVOTHROID) 75 MCG tablet Take 1 tablet (75 mcg total) by mouth daily.  30 tablet  9  . montelukast (SINGULAIR) 10 MG tablet Take 1 tablet (10 mg total) by mouth at bedtime.  30 tablet  3  . Omega-3 350 MG CAPS Take 350 mg by mouth daily.         No current facility-administered medications on file prior to visit.   Allergies  Allergen Reactions  . Sulfonamide Derivatives     There is no immunization history on file for this patient. Prior to Admission medications   Medication Sig Start Date End Date Taking? Authorizing Provider  amLODipine (NORVASC) 5 MG tablet TAKE 1 TABLET DAILY AS DIRECTED 05/03/13  Yes Vernie Shanks, MD  aspirin 81 MG tablet Take 81 mg by mouth daily.     Yes Historical Provider, MD  atenolol-chlorthalidone (TENORETIC) 50-25 MG per tablet Take 1 tablet by mouth daily. 01/13/13  Yes Vernie Shanks, MD  atorvastatin (LIPITOR) 20 MG tablet Take 0.5 tablets (10 mg total) by mouth daily. 05/10/13  Yes Vernie Shanks, MD  budesonide-formoterol [name]) 160-4.5 MCG/ACT inhaler Inhale 2 puffs into the lungs 2 (two) times daily. 03/02/13  Yes Vernie Shanks, MD  calcium-vitamin D (OSCAL WITH D) 500-200 MG-UNIT per tablet Take 1 tablet by mouth daily.     Yes Historical Provider, MD  cetirizine (ZYRTEC) 10 MG tablet Take 1 tablet (10 mg total) by mouth daily. 12/30/12  Yes Vernie Shanks, MD  levothyroxine (SYNTHROID, LEVOTHROID) 75 MCG tablet Take  1 tablet (75 mcg total) by mouth daily. 11/12/12  Yes Chipper Herb, MD  montelukast (SINGULAIR) 10 MG tablet Take 1 tablet (10 mg total) by mouth at bedtime. 03/02/13  Yes Vernie Shanks, MD  Omega-3 350 MG CAPS Take 350 mg by mouth daily.     Yes Historical Provider, MD  terazosin (HYTRIN) 2 MG capsule Take 1 capsule (2 mg total) by mouth at bedtime. 05/10/13   Vernie Shanks, MD     ROS: As above in the HPI. All other systems are stable or negative.  OBJECTIVE: APPEARANCE:  Patient in no acute distress.The patient appeared well nourished and normally developed. Acyanotic. Waist: VITAL SIGNS:BP 137/72  Pulse 58  Temp(Src) 97.6 F (36.4 C) (Oral)  Ht 5\' 6"  (1.676 m)  Wt 151 lb (68.493 kg)  BMI 24.38 kg/m2 WF  SKIN: warm and  Dry without overt rashes, tattoos and scars  HEAD and Neck: without JVD, Head and scalp: normal Eyes:No scleral icterus. Fundi normal, eye movements normal. Ears: Auricle normal, canal normal, Tympanic membranes normal, insufflation normal. Nose: normal Throat: normal Neck & thyroid: normal  CHEST & LUNGS: Chest wall: normal Lungs: Clear  CVS: Reveals the PMI to be normally located. Regular rhythm, First and Second Heart sounds are normal,  absence of murmurs, rubs or gallops. Peripheral vasculature: Radial pulses: normal Dorsal pedis pulses: normal Posterior pulses: normal  ABDOMEN:  Appearance: normal Benign, no organomegaly, no masses, no Abdominal Aortic enlargement. No Guarding , no rebound. No Bruits. Bowel sounds: normal  RECTAL: N/A GU: N/A  EXTREMETIES: nonedematous today Bilateral varicose veins.  MUSCULOSKELETAL:  Spine: normal Joints: intact  NEUROLOGIC: oriented to time,place and person; nonfocal.  ASSESSMENT: HTN (hypertension) - Plan: terazosin (HYTRIN) 2 MG capsule  HLD (hyperlipidemia) - Plan: atorvastatin (LIPITOR) 20 MG tablet  ARTHRITIS  HYPOTHYROIDISM  RENAL INSUFFICIENCY, CHRONIC  Varicose veins of  lower extremities with other complications  Pedal edema  Edema is better with medication adjustment.the etiology of th eedema is due to both the varicose veins and the amlodipine. Side effects of medicine  PLAN: Meds ordered this encounter  Medications  . atorvastatin (LIPITOR) 20 MG tablet    Sig: Take 0.5 tablets (10 mg total) by mouth daily.    Dispense:  30 tablet    Refill:  5  . terazosin (HYTRIN) 2 MG capsule    Sig: Take 1 capsule (2 mg total) by mouth at bedtime.    Dispense:  30 capsule    Refill:  2  discontinue the clonidine. Discussed with patient in detail the etiology of her pedal edema. She declines the compression stockings. She wants to adjust  medications first.  Return in about 1 week (around 05/17/2013) for recheck BP.  Lucious Zou P. Jacelyn Grip, M.D.

## 2013-05-17 ENCOUNTER — Telehealth: Payer: Self-pay | Admitting: Family Medicine

## 2013-05-17 NOTE — Telephone Encounter (Signed)
PT SAID LEGS AND ANKLES STILL SWELLING, NO SOB, NO CP. SWELLING NO BETTTER. HAS APPT ON Friday BUT THINKS SHE NEEDS TO SEE HIM SOONER.

## 2013-05-17 NOTE — Telephone Encounter (Signed)
States feels better but swelling has not changed. Doing better since stopping clonidine. No chest pain and no SOB   Has appt on Friday. .   Dr Jacelyn Grip aware and per dr Jacelyn Grip ok to wait til Friday for appt for reevaluation and reassessment.

## 2013-05-20 ENCOUNTER — Encounter: Payer: Self-pay | Admitting: Family Medicine

## 2013-05-20 ENCOUNTER — Ambulatory Visit (INDEPENDENT_AMBULATORY_CARE_PROVIDER_SITE_OTHER): Payer: Medicare Other | Admitting: Family Medicine

## 2013-05-20 VITALS — BP 153/76 | HR 70 | Temp 97.5°F | Wt 153.4 lb

## 2013-05-20 DIAGNOSIS — I83893 Varicose veins of bilateral lower extremities with other complications: Secondary | ICD-10-CM

## 2013-05-20 DIAGNOSIS — I10 Essential (primary) hypertension: Secondary | ICD-10-CM

## 2013-05-20 DIAGNOSIS — R609 Edema, unspecified: Secondary | ICD-10-CM

## 2013-05-20 DIAGNOSIS — N189 Chronic kidney disease, unspecified: Secondary | ICD-10-CM

## 2013-05-20 DIAGNOSIS — R600 Localized edema: Secondary | ICD-10-CM

## 2013-05-20 MED ORDER — AMLODIPINE BESYLATE 2.5 MG PO TABS: ORAL_TABLET | ORAL | Status: DC

## 2013-05-20 NOTE — Progress Notes (Signed)
Patient ID: [name], female   DOB: 12/29/1933, 79 y.o.   MRN: [id number] SUBJECTIVE: CC: Chief Complaint  Patient presents with  . Acute Visit    states feet and ankles still swelling states starts around 10 am and stated her weiight n am is less than in the evening     HPI: Patient is here for follow up of hypertension: denies Headache;deniesChest Pain;denies weakness;denies Shortness of Breath or Orthopnea;denies Visual changes;denies palpitations;denies cough;denies symptoms of TIA or stroke; admits to Compliance with medications. denies Problems with medications.  Pedal edema is the same. However she is using compression stockings. It starts in the late morning. When she gets up her legs are not swollen. This confirms the venous insyfficiency.  Past Medical History  Diagnosis Date  . Nodule of right lung     Right upper lobe  . Hyperlipidemia   . Hypertension   . Hypothyroidism   . Renal insufficiency     Chronic  . Arthritis    Past Surgical History  Procedure Laterality Date  . Thyroidectomy, partial     History   Social History  . Marital Status: Married    Spouse Name: N/A    Number of Children: N/A  . Years of Education: N/A   Occupational History  . Not on file.   Social History Main Topics  . Smoking status: Never Smoker   . Smokeless tobacco: Not on file     Comment: Pt denies cigarettes  . Alcohol Use: No  . Drug Use: No  . Sexual Activity: Not on file   Other Topics Concern  . Not on file   Social History Narrative  . No narrative on file   Family History  Problem Relation Age of Onset  . Thyroid disease Mother   . Stroke Father    Current Outpatient Prescriptions on File Prior to Visit  Medication Sig Dispense Refill  . aspirin 81 MG tablet Take 81 mg by mouth daily.        Marland Kitchen atenolol-chlorthalidone (TENORETIC) 50-25 MG per tablet Take 1 tablet by mouth daily.  90 tablet  1  . atorvastatin (LIPITOR) 20 MG tablet Take 0.5 tablets (10  mg total) by mouth daily.  30 tablet  5  . budesonide-formoterol (SYMBICORT) 160-4.5 MCG/ACT inhaler Inhale 2 puffs into the lungs 2 (two) times daily.  1 Inhaler  2  . calcium-vitamin D (OSCAL WITH D) 500-200 MG-UNIT per tablet Take 1 tablet by mouth daily.        . cetirizine (ZYRTEC) 10 MG tablet Take 1 tablet (10 mg total) by mouth daily.  30 tablet  11  . levothyroxine (SYNTHROID, LEVOTHROID) 75 MCG tablet Take 1 tablet (75 mcg total) by mouth daily.  30 tablet  9  . montelukast (SINGULAIR) 10 MG tablet Take 1 tablet (10 mg total) by mouth at bedtime.  30 tablet  3  . Omega-3 350 MG CAPS Take 350 mg by mouth daily.        Marland Kitchen terazosin (HYTRIN) 2 MG capsule Take 1 capsule (2 mg total) by mouth at bedtime.  30 capsule  2   No current facility-administered medications on file prior to visit.   Allergies  Allergen Reactions  . Clonidine Derivatives   . Sulfonamide Derivatives     There is no immunization history on file for this patient. Prior to Admission medications   Medication Sig Start Date End Date Taking? Authorizing Provider  amLODipine (NORVASC) 2.5 MG tablet TAKE 1 TABLET  DAILY AS DIRECTED 05/20/13  Yes Vernie Shanks, MD  aspirin 81 MG tablet Take 81 mg by mouth daily.     Yes Historical Provider, MD  atenolol-chlorthalidone (TENORETIC) 50-25 MG per tablet Take 1 tablet by mouth daily. 01/13/13  Yes Vernie Shanks, MD  atorvastatin (LIPITOR) 20 MG tablet Take 0.5 tablets (10 mg total) by mouth daily. 05/10/13  Yes Vernie Shanks, MD  budesonide-formoterol [facility]) 160-4.5 MCG/ACT inhaler Inhale 2 puffs into the lungs 2 (two) times daily. 03/02/13  Yes Vernie Shanks, MD  calcium-vitamin D (OSCAL WITH D) 500-200 MG-UNIT per tablet Take 1 tablet by mouth daily.     Yes Historical Provider, MD  cetirizine (ZYRTEC) 10 MG tablet Take 1 tablet (10 mg total) by mouth daily. 12/30/12  Yes Vernie Shanks, MD  levothyroxine (SYNTHROID, LEVOTHROID) 75 MCG tablet Take 1 tablet (75 mcg total)  by mouth daily. 11/12/12  Yes Chipper Herb, MD  montelukast (SINGULAIR) 10 MG tablet Take 1 tablet (10 mg total) by mouth at bedtime. 03/02/13  Yes Vernie Shanks, MD  Omega-3 350 MG CAPS Take 350 mg by mouth daily.     Yes Historical Provider, MD  terazosin (HYTRIN) 2 MG capsule Take 1 capsule (2 mg total) by mouth at bedtime. 05/10/13  Yes Vernie Shanks, MD    ROS: As above in the HPI. All other systems are stable or negative.  OBJECTIVE: APPEARANCE:  Patient in no acute distress.The patient appeared well nourished and normally developed. Acyanotic. Waist: VITAL SIGNS:BP 153/76  Pulse 70  Temp(Src) 97.5 F (36.4 C) (Oral)  Wt 153 lb 6.4 oz (69.582 kg)  BMI 24.77 kg/m2 WF anxious about her leg swelling  BP readings from home 128/62;120/643;136/63;135/64;114/66;116/64;116/61;126/72  SKIN: warm and  Dry without overt rashes, tattoos and scars  HEAD and Neck: without JVD, Head and scalp: normal Eyes:No scleral icterus. Fundi normal, eye movements normal. Ears: Auricle normal, canal normal, Tympanic membranes normal, insufflation normal. Nose: normal Throat: normal Neck & thyroid: normal  CHEST & LUNGS: Chest wall: normal Lungs: Clear  CVS: Reveals the PMI to be normally located. Regular rhythm, First and Second Heart sounds are normal,  absence of murmurs, rubs or gallops. Peripheral vasculature: Radial pulses: normal Dorsal pedis pulses: normal Posterior pulses: normal  ABDOMEN:  Appearance: normal Benign, no organomegaly, no masses, no Abdominal Aortic enlargement. No Guarding , no rebound. No Bruits. Bowel sounds: normal  RECTAL: N/A GU: N/A  EXTREMETIES: 1+ edematous.  MUSCULOSKELETAL:  Spine: normal Joints: intact  NEUROLOGIC: oriented to time,place and person; nonfocal. Results for orders placed in visit on 04/19/13  CMP14+EGFR      Result Value Range   Glucose 101 (*) 65 - 99 mg/dL   BUN 23  8 - 27 mg/dL   Creatinine, Ser 1.55 (*) 0.57 - 1.00  mg/dL   GFR calc non Af Amer 32 (*) >59 mL/min/1.73   GFR calc Af Amer 37 (*) >59 mL/min/1.73   BUN/Creatinine Ratio 15  11 - 26   Sodium 142  134 - 144 mmol/L   Potassium 3.8  3.5 - 5.2 mmol/L   Chloride 98  97 - 108 mmol/L   CO2 32 (*) 18 - 29 mmol/L   Calcium 9.3  8.6 - 10.2 mg/dL   Total Protein 6.7  6.0 - 8.5 g/dL   Albumin 4.6  3.5 - 4.8 g/dL   Globulin, Total 2.1  1.5 - 4.5 g/dL   Albumin/Globulin Ratio 2.2  1.1 -  2.5   Total Bilirubin 0.6  0.0 - 1.2 mg/dL   Alkaline Phosphatase 64  39 - 117 IU/L   AST 23  0 - 40 IU/L   ALT 19  0 - 32 IU/L  NMR, LIPOPROFILE      Result Value Range   LDL Particle Number 1358 (*) <1000 nmol/L   LDLC SERPL CALC-MCNC 83  <100 mg/dL   HDL Cholesterol by NMR 52  >=40 mg/dL   Triglycerides by NMR 148  <150 mg/dL   Cholesterol 165  <200 mg/dL   HDL Particle Number 33.2  >=30.5 umol/L   Small LDL Particle Number 694 (*) <=527 nmol/L   LDL Size 20.9  >20.5 nm   LP-IR Score 46 (*) <=45  TSH      Result Value Range   TSH 3.030  0.450 - 4.500 uIU/mL  POCT CBC      Result Value Range   WBC 3.4 (*) 4.6 - 10.2 K/uL   Lymph, poc 1.5  0.6 - 3.4   POC LYMPH PERCENT 44.2  10 - 50 %L   POC Granulocyte 1.7 (*) 2 - 6.9   Granulocyte percent 50.9  37 - 80 %G   RBC 4.2  4.04 - 5.48 M/uL   Hemoglobin 12.2  12.2 - 16.2 g/dL   HCT, POC 37.0 (*) 37.7 - 47.9 %   MCV 87.5  80 - 97 fL   MCH, POC 28.8  27 - 31.2 pg   MCHC 32.9  31.8 - 35.4 g/dL   RDW, POC 13.4     Platelet Count, POC 139.0 (*) 142 - 424 K/uL   MPV 6.5  0 - 99.8 fL    ASSESSMENT: Pedal edema  HTN (hypertension) - Plan: amLODipine (NORVASC) 2.5 MG tablet  HYPERTENSION  RENAL INSUFFICIENCY, CHRONIC  Varicose veins of lower extremities with other complications better  PLAN: Medications Discontinued During This Encounter  Medication Reason  . amLODipine (NORVASC) 5 MG tablet Reorder    Meds ordered this encounter  Medications  . amLODipine (NORVASC) 2.5 MG tablet    Sig: TAKE 1  TABLET DAILY AS DIRECTED    Dispense:  30 tablet    Refill:  5    Continue with the compression stockings.  Return in about 2 weeks (around 06/03/2013) for recheck BP.  Arnulfo Batson P. Jacelyn Grip, M.D.

## 2013-05-31 ENCOUNTER — Encounter: Payer: Self-pay | Admitting: Family Medicine

## 2013-05-31 ENCOUNTER — Encounter (INDEPENDENT_AMBULATORY_CARE_PROVIDER_SITE_OTHER): Payer: Self-pay

## 2013-05-31 ENCOUNTER — Ambulatory Visit (INDEPENDENT_AMBULATORY_CARE_PROVIDER_SITE_OTHER): Payer: Medicare Other | Admitting: Family Medicine

## 2013-05-31 VITALS — BP 161/73 | HR 74 | Temp 97.9°F | Wt 153.2 lb

## 2013-05-31 DIAGNOSIS — R0602 Shortness of breath: Secondary | ICD-10-CM | POA: Insufficient documentation

## 2013-05-31 DIAGNOSIS — E785 Hyperlipidemia, unspecified: Secondary | ICD-10-CM

## 2013-05-31 DIAGNOSIS — I10 Essential (primary) hypertension: Secondary | ICD-10-CM

## 2013-05-31 DIAGNOSIS — Z23 Encounter for immunization: Secondary | ICD-10-CM | POA: Insufficient documentation

## 2013-05-31 DIAGNOSIS — R9431 Abnormal electrocardiogram [ECG] [EKG]: Secondary | ICD-10-CM

## 2013-05-31 DIAGNOSIS — R600 Localized edema: Secondary | ICD-10-CM

## 2013-05-31 DIAGNOSIS — I83893 Varicose veins of bilateral lower extremities with other complications: Secondary | ICD-10-CM

## 2013-05-31 DIAGNOSIS — E039 Hypothyroidism, unspecified: Secondary | ICD-10-CM

## 2013-05-31 DIAGNOSIS — R609 Edema, unspecified: Secondary | ICD-10-CM

## 2013-05-31 MED ORDER — TERAZOSIN HCL 5 MG PO CAPS: 5.0000 mg | ORAL_CAPSULE | Freq: Every day | ORAL | Status: DC

## 2013-05-31 NOTE — Progress Notes (Signed)
Patient ID: [name], female   DOB: [birth year], 78 y.o.   MRN: [id number] SUBJECTIVE: CC: Chief Complaint  Patient presents with  . Follow-up    states still having swelling in feet and legs and SOB      HPI: Patient is here for follow up of hypertension and pedal edema.: We have  weaned down on a low dose of the amlodipine which was felt to aggravate venous stasis pedal edema. The edema has gotten better a little bit. She continues to wake up with no swelling , but after she walks for a couple of hours her legs  Swell. Her daughter sent a message that they would like the patient to see a cardiologist because the patient claims a bit of shortness of breath at times. nThe SOB can be at rest. No chest pain. denies Headache;deniesChest Pain;denies weakness; Has occasional Shortness of Breath and Orthopnea; and bending over denies Visual changes;denies palpitations;denies cough; pedal edema: unchanged still swells after getting up denies symptoms of TIA or stroke; admits to Compliance with medications. denies Problems with medications. Doesn't think medications work because BP still elevated. No dizziness. Also the compression stockings she says she had had are NOT compression Rx strength stockings. These are cheap $9 stockings she picked up at the drug store and they help with the fatigue in her legs.  Past Medical History  Diagnosis Date  . Nodule of right lung     Right upper lobe  . Hyperlipidemia   . Hypertension   . Hypothyroidism   . Renal insufficiency     Chronic  . Arthritis    Past Surgical History  Procedure Laterality Date  . Thyroidectomy, partial     History   Social History  . Marital Status: Married    Spouse Name: N/A    Number of Children: N/A  . Years of Education: N/A   Occupational History  . Not on file.   Social History Main Topics  . Smoking status: Never Smoker   . Smokeless tobacco: Not on file     Comment: Pt denies cigarettes  . Alcohol  Use: No  . Drug Use: No  . Sexual Activity: Not on file   Other Topics Concern  . Not on file   Social History Narrative  . No narrative on file   Family History  Problem Relation Age of Onset  . Thyroid disease Mother   . Stroke Father    Current Outpatient Prescriptions on File Prior to Visit  Medication Sig Dispense Refill  . amLODipine (NORVASC) 2.5 MG tablet TAKE 1 TABLET DAILY AS DIRECTED  30 tablet  5  . aspirin 81 MG tablet Take 81 mg by mouth daily.        Marland Kitchen atenolol-chlorthalidone (TENORETIC) 50-25 MG per tablet Take 1 tablet by mouth daily.  90 tablet  1  . atorvastatin (LIPITOR) 20 MG tablet Take 0.5 tablets (10 mg total) by mouth daily.  30 tablet  5  . calcium-vitamin D (OSCAL WITH D) 500-200 MG-UNIT per tablet Take 1 tablet by mouth daily.        . cetirizine (ZYRTEC) 10 MG tablet Take 1 tablet (10 mg total) by mouth daily.  30 tablet  11  . levothyroxine (SYNTHROID, LEVOTHROID) 75 MCG tablet Take 1 tablet (75 mcg total) by mouth daily.  30 tablet  9  . montelukast (SINGULAIR) 10 MG tablet Take 1 tablet (10 mg total) by mouth at bedtime.  30 tablet  3  .  Omega-3 350 MG CAPS Take 350 mg by mouth daily.        . budesonide-formoterol (SYMBICORT) 160-4.5 MCG/ACT inhaler Inhale 2 puffs into the lungs 2 (two) times daily.  1 Inhaler  2   No current facility-administered medications on file prior to visit.   Allergies  Allergen Reactions  . Clonidine Derivatives   . Sulfonamide Derivatives    Immunization History  Administered Date(s) Administered  . Influenza,inj,Quad PF,36+ Mos 05/31/2013   Prior to Admission medications   Medication Sig Start Date End Date Taking? Authorizing Provider  amLODipine (NORVASC) 2.5 MG tablet TAKE 1 TABLET DAILY AS DIRECTED 05/20/13  Yes Vernie Shanks, MD  aspirin 81 MG tablet Take 81 mg by mouth daily.     Yes Historical Provider, MD  atenolol-chlorthalidone (TENORETIC) 50-25 MG per tablet Take 1 tablet by mouth daily. 01/13/13  Yes  Vernie Shanks, MD  atorvastatin (LIPITOR) 20 MG tablet Take 0.5 tablets (10 mg total) by mouth daily. 05/10/13  Yes Vernie Shanks, MD  calcium-vitamin D (OSCAL WITH D) 500-200 MG-UNIT per tablet Take 1 tablet by mouth daily.     Yes Historical Provider, MD  cetirizine (ZYRTEC) 10 MG tablet Take 1 tablet (10 mg total) by mouth daily. 12/30/12  Yes Vernie Shanks, MD  levothyroxine (SYNTHROID, LEVOTHROID) 75 MCG tablet Take 1 tablet (75 mcg total) by mouth daily. 11/12/12  Yes Chipper Herb, MD  montelukast (SINGULAIR) 10 MG tablet Take 1 tablet (10 mg total) by mouth at bedtime. 03/02/13  Yes Vernie Shanks, MD  Omega-3 350 MG CAPS Take 350 mg by mouth daily.     Yes Historical Provider, MD  terazosin (HYTRIN) 2 MG capsule Take 1 capsule (2 mg total) by mouth at bedtime. 05/10/13  Yes Vernie Shanks, MD  budesonide-formoterol [facility]) 160-4.5 MCG/ACT inhaler Inhale 2 puffs into the lungs 2 (two) times daily. 03/02/13   Vernie Shanks, MD    ROS: As above in the HPI. All other systems are stable or negative.  OBJECTIVE: APPEARANCE:  Patient in no acute distress.The patient appeared well nourished and normally developed. Acyanotic. Waist: VITAL SIGNS:BP 161/73  Pulse 74  Temp(Src) 97.9 F (36.6 C) (Oral)  Wt 153 lb 3.2 oz (69.491 kg)  BMI 24.74 kg/m2 WF  SKIN: warm and  Dry without overt rashes, tattoos and scars  HEAD and Neck: without JVD, Head and scalp: normal Eyes:No scleral icterus. Fundi normal, eye movements normal. Ears: Auricle normal, canal normal, Tympanic membranes normal, insufflation normal. Nose: normal Throat: normal Neck & thyroid: normal  CHEST & LUNGS: Chest wall: normal Lungs: Clear  CVS: Reveals the PMI to be normally located. Regular rhythm, First and Second Heart sounds are normal,  absence of murmurs, rubs or gallops. Peripheral vasculature: Radial pulses: normal Dorsal pedis pulses: normal Posterior pulses: normal  ABDOMEN:  Appearance:  normal Benign, no organomegaly, no masses, no Abdominal Aortic enlargement. No Guarding , no rebound. No Bruits. Bowel sounds: normal  RECTAL: N/A GU: N/A  EXTREMETIES: bilateral trace edema both legs. Homan's negative.patient has on knee stockings with mild compression.  MUSCULOSKELETAL:  Spine: normal Joints: intact  NEUROLOGIC: oriented to time,place and person; nonfocal. Strength is normal Sensory is normal Reflexes are normal Cranial Nerves are normal.  X Rays: *RADIOLOGY REPORT*  Clinical Data: Cough and wheezing. Shortness of breath.  CHEST - 2 VIEW  Comparison: C t chest with 09/27/2008 [facility]. Chest x-  ray from [facility] on  11/29/2007.  Findings: The lungs are clear without focal infiltrate, edema,  pneumothorax or pleural effusion. The cardiopericardial silhouette  is within normal limits for size. Imaged bony structures of the  thorax are intact.  IMPRESSION:  No acute findings. No evidence to explain the patient's history of  cough and wheezing.  Results for orders placed in visit on 04/19/13  CMP14+EGFR      Result Value Range   Glucose 101 (*) 65 - 99 mg/dL   BUN 23  8 - 27 mg/dL   Creatinine, Ser 1.55 (*) 0.57 - 1.00 mg/dL   GFR calc non Af Amer 32 (*) >59 mL/min/1.73   GFR calc Af Amer 37 (*) >59 mL/min/1.73   BUN/Creatinine Ratio 15  11 - 26   Sodium 142  134 - 144 mmol/L   Potassium 3.8  3.5 - 5.2 mmol/L   Chloride 98  97 - 108 mmol/L   CO2 32 (*) 18 - 29 mmol/L   Calcium 9.3  8.6 - 10.2 mg/dL   Total Protein 6.7  6.0 - 8.5 g/dL   Albumin 4.6  3.5 - 4.8 g/dL   Globulin, Total 2.1  1.5 - 4.5 g/dL   Albumin/Globulin Ratio 2.2  1.1 - 2.5   Total Bilirubin 0.6  0.0 - 1.2 mg/dL   Alkaline Phosphatase 64  39 - 117 IU/L   AST 23  0 - 40 IU/L   ALT 19  0 - 32 IU/L  NMR, LIPOPROFILE      Result Value Range   LDL Particle Number 1358 (*) <1000 nmol/L   LDLC SERPL CALC-MCNC 83  <100 mg/dL   HDL Cholesterol by NMR 52  >=40  mg/dL   Triglycerides by NMR 148  <150 mg/dL   Cholesterol 165  <200 mg/dL   HDL Particle Number 33.2  >=30.5 umol/L   Small LDL Particle Number 694 (*) <=527 nmol/L   LDL Size 20.9  >20.5 nm   LP-IR Score 46 (*) <=45  TSH      Result Value Range   TSH 3.030  0.450 - 4.500 uIU/mL  POCT CBC      Result Value Range   WBC 3.4 (*) 4.6 - 10.2 K/uL   Lymph, poc 1.5  0.6 - 3.4   POC LYMPH PERCENT 44.2  10 - 50 %L   POC Granulocyte 1.7 (*) 2 - 6.9   Granulocyte percent 50.9  37 - 80 %G   RBC 4.2  4.04 - 5.48 M/uL   Hemoglobin 12.2  12.2 - 16.2 g/dL   HCT, POC 37.0 (*) 37.7 - 47.9 %   MCV 87.5  80 - 97 fL   MCH, POC 28.8  27 - 31.2 pg   MCHC 32.9  31.8 - 35.4 g/dL   RDW, POC 13.4     Platelet Count, POC 139.0 (*) 142 - 424 K/uL   MPV 6.5  0 - 99.8 fL    ASSESSMENT: HYPERTENSION - Plan: Ambulatory referral to Cardiology  SOB (shortness of breath) - Plan: EKG 12-Lead, Ambulatory referral to Cardiology  Varicose veins of lower extremities with other complications  Need for prophylactic vaccination and inoculation against influenza  Pedal edema - Plan: Ambulatory referral to Cardiology  ABNORMAL STRESS ELECTROCARDIOGRAM  HYPERLIPIDEMIA  HYPOTHYROIDISM  HTN (hypertension) - Plan: terazosin (HYTRIN) 5 MG capsule  PLAN: Prescribed Rx strength compression stockings. Rx for medium grade compression stockings (20-30)mm Hg) 1 Pair; RFx3  Orders Placed This Encounter  Procedures  . Ambulatory referral  to Cardiology    Referral Priority:  Routine    Referral Type:  Consultation    Referral Reason:  Specialty Services Required    Requested Specialty:  Cardiology    Number of Visits Requested:  1  . EKG 12-Lead   EKG:  Normal.  Reviewed her labs, recent C Xrays.  Meds ordered this encounter  Medications  . terazosin (HYTRIN) 5 MG capsule    Sig: Take 1 capsule (5 mg total) by mouth at bedtime.    Dispense:  30 capsule    Refill:  2   Medications Discontinued During  This Encounter  Medication Reason  . terazosin (HYTRIN) 2 MG capsule Reorder   Return for recheck BP in 3 days,follow up with Dr Jacelyn Grip in 6 weeks.Dub Mikes P. Jacelyn Grip, M.D.

## 2013-06-03 ENCOUNTER — Ambulatory Visit (INDEPENDENT_AMBULATORY_CARE_PROVIDER_SITE_OTHER): Payer: Medicare Other | Admitting: *Deleted

## 2013-06-03 VITALS — BP 122/54 | HR 54

## 2013-06-03 DIAGNOSIS — I10 Essential (primary) hypertension: Secondary | ICD-10-CM

## 2013-06-03 NOTE — Progress Notes (Signed)
Patient ID: [name], female   DOB: 1933-12-09, 79 y.o.   MRN: [id number] Home readings:  10/15- 130 66, 52 10/15- 149/65, 61  10/16- 110/49, 54  10/16- 125/51, 58   10/17- 130/58, 35  Dr Jacelyn Grip to review readings, and call pt with any concerns.

## 2013-06-09 ENCOUNTER — Telehealth: Payer: Self-pay | Admitting: Family Medicine

## 2013-06-09 NOTE — Telephone Encounter (Signed)
Spoke with pt states med" terazosin " makes her nausea .states was doing ok taking the 2mg  but since increase to 5mg  has noticed nausea and mild hives. Denies any swallowing problems.

## 2013-06-10 ENCOUNTER — Encounter (INDEPENDENT_AMBULATORY_CARE_PROVIDER_SITE_OTHER): Payer: Self-pay

## 2013-06-10 ENCOUNTER — Ambulatory Visit (INDEPENDENT_AMBULATORY_CARE_PROVIDER_SITE_OTHER): Payer: Medicare Other

## 2013-06-10 ENCOUNTER — Ambulatory Visit (INDEPENDENT_AMBULATORY_CARE_PROVIDER_SITE_OTHER): Payer: Medicare Other | Admitting: Family Medicine

## 2013-06-10 VITALS — BP 153/70 | HR 61 | Temp 97.5°F | Ht 66.0 in | Wt 153.0 lb

## 2013-06-10 DIAGNOSIS — T50905A Adverse effect of unspecified drugs, medicaments and biological substances, initial encounter: Secondary | ICD-10-CM

## 2013-06-10 DIAGNOSIS — R609 Edema, unspecified: Secondary | ICD-10-CM

## 2013-06-10 DIAGNOSIS — I10 Essential (primary) hypertension: Secondary | ICD-10-CM

## 2013-06-10 DIAGNOSIS — R635 Abnormal weight gain: Secondary | ICD-10-CM

## 2013-06-10 DIAGNOSIS — E785 Hyperlipidemia, unspecified: Secondary | ICD-10-CM

## 2013-06-10 DIAGNOSIS — R0602 Shortness of breath: Secondary | ICD-10-CM

## 2013-06-10 DIAGNOSIS — T887XXA Unspecified adverse effect of drug or medicament, initial encounter: Secondary | ICD-10-CM

## 2013-06-10 NOTE — Patient Instructions (Addendum)
Follow through with the appointment with the cardiologist  Reduce hytrin back to 2 mg daily Continue to watch sodium intake Resume singular We will call you with the results of lab work is returned

## 2013-06-10 NOTE — Progress Notes (Signed)
Subjective:    Patient ID: [name], female    DOB: 01-12-1934, 79 y.o.   MRN: [id number]  HPI Patient comes in today because of hives and nausea secondary to increase of Hytrin medication for blood pressure. The patient has had several months of problems and difficulty with controlling blood pressure appropriately without having side effects from the medication. She has been on an ACE inhibitor which did well with controlling her blood pressure but she developed a cough. She has taken amlodipine, but this had to be reduced because of increasing edema. She is now on amlodipine 2-1/2 mg but is still having edema. She was started on Hytrin and this was increased to 5 mg but this has Colles nausea and urticaria. She has an appointment to see the cardiologist next week because of the edema and shortness of breath with walking up inclines. Also we will like to see if he has any thoughts about additional blood pressure management with the knowledge of the side effects of the medications that she has tried.   Review of Systems  Constitutional: Negative.   HENT: Negative.   Eyes: Negative.   Respiratory: Negative.   Cardiovascular: Negative.   Gastrointestinal: Positive for nausea. Negative for vomiting, abdominal pain, diarrhea, constipation, blood in stool, abdominal distention, anal bleeding and rectal pain.  Genitourinary: Negative.   Musculoskeletal: Negative.   Skin: Positive for rash.  Allergic/Immunologic: Negative.   Neurological: Negative.   Hematological: Negative.   Psychiatric/Behavioral: Negative.        Objective:   Physical Exam  Nursing note and vitals reviewed. Constitutional: She is oriented to person, place, and time. She appears well-developed and well-nourished. No distress.  HENT:  Head: Normocephalic.  Right Ear: External ear normal.  Left Ear: External ear normal.  Nose: Nose normal.  Mouth/Throat: Oropharynx is clear and moist. No oropharyngeal exudate.  Eyes:  Conjunctivae and EOM are normal. Right eye exhibits no discharge. Left eye exhibits no discharge. No scleral icterus.  Neck: Normal range of motion. Neck supple. No thyromegaly present.  No bruits  Cardiovascular: Normal rate, regular rhythm, normal heart sounds and intact distal pulses.  Exam reveals no gallop and no friction rub.   No murmur heard.  72 per minute  Pulmonary/Chest: Effort normal and breath sounds normal. No respiratory distress. She has no wheezes. She has no rales. She exhibits no tenderness.  Abdominal: Soft. Bowel sounds are normal. She exhibits no mass. There is no tenderness. There is no rebound and no guarding.  Musculoskeletal: Normal range of motion. She exhibits no edema and no tenderness.  Patient has only mild support hose today and there was no apparent edema in her lower extremities this morning  Lymphadenopathy:    She has no cervical adenopathy.  Neurological: She is alert and oriented to person, place, and time. She has normal reflexes. No cranial nerve deficit.  Skin: Skin is warm and dry. No rash noted.  No urticaria apparent at this time  Psychiatric: She has a normal mood and affect. Her behavior is normal. Judgment and thought content normal.   Repeat blood pressure 15 3/70  [facility] reading (PRIMARY) by  Dr.Kalah Pflum; repeat chest x-ray within normal limits unable to compare to previous film                                    Assessment & Plan:    1. HYPERTENSION   2.  HYPERLIPIDEMIA   3. Shortness of breath   4. Medication side effect, initial encounter   5. Edema   6. Weight gain    Orders Placed This Encounter  Procedures  . Brain natriuretic peptide  . BMP8+EGFR  . Ambulatory referral to Nephrology    Referral Priority:  Routine    Referral Type:  Consultation    Referral Reason:  Specialty Services Required    Referred to Provider:  Lucrezia Starch, MD    Requested Specialty:  Nephrology    Number of Visits Requested:  1  . POCT CBC    -- chest x-ray  Patient Instructions  Follow through with the appointment with the cardiologist  Reduce hytrin back to 2 mg daily Continue to watch sodium intake Reason singular We will call you with the results of lab work is returned   Arrie Senate MD

## 2013-06-10 NOTE — Telephone Encounter (Signed)
Patient came in to be seen

## 2013-06-11 LAB — CBC WITH DIFFERENTIAL/PLATELET
Basos: 0 %
Eosinophils Absolute: 0 10*3/uL (ref 0.0–0.4)
Hemoglobin: 10.6 g/dL — ABNORMAL LOW (ref 11.1–15.9)
Immature Granulocytes: 0 %
Lymphs: 30 %
Monocytes: 9 %
Neutrophils Absolute: 2.1 10*3/uL (ref 1.4–7.0)
RBC: 3.76 x10E6/uL — ABNORMAL LOW (ref 3.77–5.28)
WBC: 3.5 10*3/uL (ref 3.4–10.8)

## 2013-06-14 ENCOUNTER — Ambulatory Visit (INDEPENDENT_AMBULATORY_CARE_PROVIDER_SITE_OTHER): Payer: Medicare Other | Admitting: Nurse Practitioner

## 2013-06-14 ENCOUNTER — Encounter: Payer: Self-pay | Admitting: Nurse Practitioner

## 2013-06-14 VITALS — BP 119/58 | HR 62 | Temp 97.0°F | Ht 62.0 in | Wt 152.0 lb

## 2013-06-14 DIAGNOSIS — L500 Allergic urticaria: Secondary | ICD-10-CM

## 2013-06-14 LAB — BMP8+EGFR
BUN: 11 mg/dL (ref 8–27)
CO2: 30 mmol/L — ABNORMAL HIGH (ref 18–29)
Calcium: 9.7 mg/dL (ref 8.6–10.2)
Creatinine, Ser: 1.17 mg/dL — ABNORMAL HIGH (ref 0.57–1.00)
GFR calc Af Amer: 52 mL/min/{1.73_m2} — ABNORMAL LOW (ref >59)
GFR calc non Af Amer: 45 mL/min/{1.73_m2} — ABNORMAL LOW (ref >59)
Sodium: 141 mmol/L (ref 134–144)

## 2013-06-14 MED ORDER — METHYLPREDNISOLONE SODIUM SUCC 125 MG IJ SOLR
125.0000 mg | Freq: Once | INTRAMUSCULAR | Status: AC
  Administered 2013-06-14: 125 mg via INTRAMUSCULAR

## 2013-06-14 NOTE — Progress Notes (Signed)
  Subjective:    Patient ID: [name], female    DOB: 11/18/1933, 79 y.o.   MRN: [id number]  HPI Patient in with C/O hives- started when she increased  Hytrin to 5mg - dose was decreased to 2.5 mg last week- That helped with her nausea- but hives coming and going.    Review of Systems  Constitutional: Negative.   HENT: Negative.   Respiratory: Negative.   Gastrointestinal: Negative.   Genitourinary: Negative.   Musculoskeletal: Negative.   Skin: Positive for rash.       Objective:   Physical Exam  Constitutional: She appears well-developed and well-nourished.  Cardiovascular: Normal rate, regular rhythm, normal heart sounds and intact distal pulses.   Pulmonary/Chest: Effort normal and breath sounds normal.  Skin:  Scattered urticaria on abdomen , back and forearms    BP 119/58  Pulse 62  Temp(Src) 97 F (36.1 C) (Oral)  Ht 5\' 2"  (1.575 m)  Wt 152 lb (68.947 kg)  BMI 27.79 kg/m2       Assessment & Plan:   1. Allergic urticaria    Meds ordered this encounter  Medications  . methylPREDNISolone sodium succinate (SOLU-MEDROL) 125 mg/2 mL injection 125 mg    Sig:    Avoid scratching May have to stop hytrin all together if hives continue  Mary-Margaret Hassell Done, FNP

## 2013-06-14 NOTE — Patient Instructions (Signed)

## 2013-06-15 ENCOUNTER — Ambulatory Visit (INDEPENDENT_AMBULATORY_CARE_PROVIDER_SITE_OTHER): Payer: Medicare Other | Admitting: Cardiology

## 2013-06-15 ENCOUNTER — Encounter: Payer: Self-pay | Admitting: Cardiology

## 2013-06-15 VITALS — BP 100/68 | HR 74 | Ht 62.0 in | Wt 152.0 lb

## 2013-06-15 DIAGNOSIS — I10 Essential (primary) hypertension: Secondary | ICD-10-CM

## 2013-06-15 MED ORDER — HYDRALAZINE HCL 25 MG PO TABS: 25.0000 mg | ORAL_TABLET | Freq: Three times a day (TID) | ORAL | Status: DC

## 2013-06-15 MED ORDER — FUROSEMIDE 40 MG PO TABS: 40.0000 mg | ORAL_TABLET | Freq: Every day | ORAL | Status: DC

## 2013-06-15 NOTE — Patient Instructions (Signed)
Your physician recommends that you schedule a follow-up appointment in:  2 weeks.    Your physician recommends that you return for lab work in:  2 weeks on day of appointment with Dr. Meda Coffee    Your physician has requested that you have an echocardiogram. Echocardiography is a painless test that uses sound waves to create images of your heart. It provides your doctor with information about the size and shape of your heart and how well your heart's chambers and valves are working. This procedure takes approximately one hour. There are no restrictions for this procedure.  Your physician has recommended you make the following change in your medication:  Stop Norvasc.  Stop Hytrin.   Start furosemide 40 mg by mouth daily. Start hydralazine 25 mg by mouth three times daily.

## 2013-06-15 NOTE — Progress Notes (Signed)
Patient ID: [name], female   DOB: 1934-02-10, 79 y.o.   MRN: [id number]    Patient Name: [name] Date of Encounter: 06/15/2013  Primary Care Provider:  Anthoney Harada, MD Primary Cardiologist:  D   Patient Profile  Re-establish care, former patient of Dr Percival Spanish, LE edema, SOB  Problem List   Past Medical History  Diagnosis Date  . Nodule of right lung     Right upper lobe  . Hyperlipidemia   . Hypertension   . Hypothyroidism   . Renal insufficiency     Chronic  . Arthritis    Past Surgical History  Procedure Laterality Date  . Thyroidectomy, partial      Allergies  Allergies  Allergen Reactions  . Clonidine Derivatives   . Sulfonamide Derivatives   . Terazosin     HPI  A very pleasant 79 year old female who was previously followed by Dr. Percival Spanish. She is coming for second opinion in regards to her blood pressure management as she is intolerant to multiple medications. She brings a list of medications with specific reasons why she stopped using them, lisinopril was associated with chronic cough, she feels that since she is taking amlodipine she has developed lower extremity edema and Terazosin gives her hives. She was using clonidine in the past but she felt exhausted.  The patient states that she noticed dyspnea on exertion, and she has developed lower extremity edema ever since amlodipine has been increased to 5 mg daily.  She otherwise denies symptoms of chest pain, palpitations, or syncope. She denies orthopnea and paroxysmal nocturnal dyspnea.  Home Medications  Prior to Admission medications   Medication Sig Start Date End Date Taking? Authorizing Provider  amLODipine (NORVASC) 2.5 MG tablet TAKE 1 TABLET DAILY AS DIRECTED 05/20/13  Yes Vernie Shanks, MD  aspirin 81 MG tablet Take 81 mg by mouth daily.     Yes Historical Provider, MD  atenolol-chlorthalidone (TENORETIC) 50-25 MG per tablet Take 1 tablet by mouth daily. 01/13/13  Yes  Vernie Shanks, MD  atorvastatin (LIPITOR) 20 MG tablet Take 0.5 tablets (10 mg total) by mouth daily. 05/10/13  Yes Vernie Shanks, MD  calcium-vitamin D (OSCAL WITH D) 500-200 MG-UNIT per tablet Take 1 tablet by mouth daily.     Yes Historical Provider, MD  levothyroxine (SYNTHROID, LEVOTHROID) 75 MCG tablet Take 1 tablet (75 mcg total) by mouth daily. 11/12/12  Yes Chipper Herb, MD  montelukast (SINGULAIR) 10 MG tablet Take 1 tablet (10 mg total) by mouth at bedtime. 03/02/13  Yes Vernie Shanks, MD  Omega-3 350 MG CAPS Take 1,000 mg by mouth 2 (two) times daily.    Yes Historical Provider, MD  terazosin (HYTRIN) 2 MG capsule Take 2 mg by mouth at bedtime.   Yes Historical Provider, MD    Family History  Family History  Problem Relation Age of Onset  . Thyroid disease Mother   . Stroke Father     Social History  History   Social History  . Marital Status: Married    Spouse Name: N/A    Number of Children: N/A  . Years of Education: N/A   Occupational History  . Not on file.   Social History Main Topics  . Smoking status: Never Smoker   . Smokeless tobacco: Not on file     Comment: Pt denies cigarettes  . Alcohol Use: No  . Drug Use: No  . Sexual Activity: Not on file  Other Topics Concern  . Not on file   Social History Narrative  . No narrative on file     Review of Systems, per history of present illness otherwise negative General:  No chills, fever, night sweats or weight changes.  Cardiovascular:  No chest pain, dyspnea on exertion, edema, orthopnea, palpitations, paroxysmal nocturnal dyspnea. Dermatological: No rash, lesions/masses Respiratory: No cough, dyspnea Urologic: No hematuria, dysuria Abdominal:   No nausea, vomiting, diarrhea, bright red blood per rectum, melena, or hematemesis Neurologic:  No visual changes, wkns, changes in mental status. All other systems reviewed and are otherwise negative except as noted above.  Physical Exam  Blood  pressure 100/68, pulse 74, height 5\' 2"  (1.575 m), weight 152 lb (68.947 kg).  General: Pleasant, NAD Psych: Normal affect. Neuro: Alert and oriented X 3. Moves all extremities spontaneously. HEENT: Normal  Neck: Supple without bruits or JVD. Lungs:  Resp regular and unlabored, CTA. Heart: RRR no s3, s4, or murmurs. Abdomen: Soft, non-tender, non-distended, BS + x 4.  Extremities: No clubbing, cyanosis, +2 nonpitting edema up to her knees. DP/PT/Radials 2+ and equal bilaterally.  Accessory Clinical Findings  ECG - sinus rhythm, 60 beats per minute, incomplete right bundle branch block otherwise normal EKG   Assessment & Plan A very pleasant 79 year old female  1. Hypertension - patient is intolerant to multiple medications, the plan is to: D/C Terazosin (hives) D/C Amlodipine (edema) And  Start lasix 40 mg po daily  Start hydralazine 25 mg po TID  2. Dyspnea on exertion, lower strandy edema - Order transthoracic echocardiogram to evaluate left and right ventricular function, intracardiac pressures and diastolic function  We will follow in 2 weeks with blood pressure measured measurements   Ena Dawley, Lemmie Evens, MD  06/15/2013, 2:41 PM

## 2013-06-16 ENCOUNTER — Telehealth: Payer: Self-pay | Admitting: Family Medicine

## 2013-06-16 ENCOUNTER — Ambulatory Visit (HOSPITAL_COMMUNITY)
Admission: RE | Admit: 2013-06-16 | Discharge: 2013-06-16 | Disposition: A | Discharge status: Home / Self Care | Payer: Medicare Other | Source: Ambulatory Visit | Attending: Cardiology | Admitting: Cardiology

## 2013-06-16 DIAGNOSIS — R0602 Shortness of breath: Secondary | ICD-10-CM

## 2013-06-16 DIAGNOSIS — I10 Essential (primary) hypertension: Secondary | ICD-10-CM | POA: Insufficient documentation

## 2013-06-16 NOTE — Telephone Encounter (Signed)
Pt called and fobt given

## 2013-06-16 NOTE — Progress Notes (Signed)
2D Echo Performed 06/16/2013    [name], RCS

## 2013-06-17 ENCOUNTER — Encounter (INDEPENDENT_AMBULATORY_CARE_PROVIDER_SITE_OTHER): Payer: Medicare Other

## 2013-06-17 LAB — BRAIN NATRIURETIC PEPTIDE: BNP: 154.2 pg/mL — ABNORMAL HIGH (ref 0.0–100.0)

## 2013-06-21 ENCOUNTER — Other Ambulatory Visit (INDEPENDENT_AMBULATORY_CARE_PROVIDER_SITE_OTHER): Payer: Medicare Other

## 2013-06-21 DIAGNOSIS — Z1212 Encounter for screening for malignant neoplasm of rectum: Secondary | ICD-10-CM

## 2013-06-23 LAB — FECAL OCCULT BLOOD, IMMUNOCHEMICAL: Fecal Occult Bld: NEGATIVE

## 2013-06-23 NOTE — Progress Notes (Signed)
Quick Note:  Call patient. Labs normal. No change in plan. ______ 

## 2013-06-30 ENCOUNTER — Encounter: Payer: Self-pay | Admitting: Cardiology

## 2013-06-30 ENCOUNTER — Other Ambulatory Visit: Payer: Medicare Other

## 2013-06-30 ENCOUNTER — Telehealth: Payer: Self-pay | Admitting: *Deleted

## 2013-06-30 ENCOUNTER — Ambulatory Visit (INDEPENDENT_AMBULATORY_CARE_PROVIDER_SITE_OTHER): Payer: Medicare Other | Admitting: Cardiology

## 2013-06-30 VITALS — BP 139/52 | HR 60 | Ht 65.0 in | Wt 148.0 lb

## 2013-06-30 DIAGNOSIS — R600 Localized edema: Secondary | ICD-10-CM | POA: Insufficient documentation

## 2013-06-30 DIAGNOSIS — E8779 Other fluid overload: Secondary | ICD-10-CM

## 2013-06-30 DIAGNOSIS — E876 Hypokalemia: Secondary | ICD-10-CM

## 2013-06-30 DIAGNOSIS — R0602 Shortness of breath: Secondary | ICD-10-CM

## 2013-06-30 DIAGNOSIS — I10 Essential (primary) hypertension: Secondary | ICD-10-CM | POA: Insufficient documentation

## 2013-06-30 DIAGNOSIS — R609 Edema, unspecified: Secondary | ICD-10-CM

## 2013-06-30 LAB — BASIC METABOLIC PANEL
BUN: 19 mg/dL (ref 6–23)
CO2: 35 mEq/L — ABNORMAL HIGH (ref 19–32)
Calcium: 9.4 mg/dL (ref 8.4–10.5)
Chloride: 95 mEq/L — ABNORMAL LOW (ref 96–112)
Creatinine, Ser: 1.4 mg/dL — ABNORMAL HIGH (ref 0.4–1.2)
GFR: 38.39 mL/min — ABNORMAL LOW (ref >60.00)
Glucose, Bld: 111 mg/dL — ABNORMAL HIGH (ref 70–99)
Potassium: 3.2 mEq/L — ABNORMAL LOW (ref 3.5–5.1)
Sodium: 138 mEq/L (ref 135–145)

## 2013-06-30 LAB — BRAIN NATRIURETIC PEPTIDE: Pro B Natriuretic peptide (BNP): 132 pg/mL — ABNORMAL HIGH (ref 0.0–100.0)

## 2013-06-30 MED ORDER — FUROSEMIDE 20 MG PO TABS: 20.0000 mg | ORAL_TABLET | Freq: Every day | ORAL | Status: DC

## 2013-06-30 MED ORDER — POTASSIUM CHLORIDE ER 10 MEQ PO TBCR: 10.0000 meq | EXTENDED_RELEASE_TABLET | Freq: Every day | ORAL | Status: DC

## 2013-06-30 NOTE — Telephone Encounter (Signed)
Dr. Meda Coffee has reviewed today's labs. Recommends pt decrease furosemide to 20mg  daily.  Start KCL 19meq daily & today only take 15meq.  Repeat BMP in 2 weeks.  I have spoken with pt & sent prescription in for kcl. She understands medication instructions. She will go to [facility] Dr. Tawanna Sat office in 2 weeks for BMP. Order placed in epic  Horton Chin RN

## 2013-06-30 NOTE — Patient Instructions (Addendum)
Your physician wants you to follow-up in: 6 MONTHS WITH DR. Johann Capers will receive a reminder letter in the mail two months in advance. If you don't receive a letter, please call our office to schedule the follow-up appointment.  Your physician recommends that you return for lab work in:  BNP.BMP, ELECTROLYTES   Your physician has recommended you make the following change in your medication:   DECREASE YOUR LASIX 20 MG ONCE A DAY ( WE WILL CALL YOU AFTER YOUR LAB RESULTS COME IN TO FURTHER INSTRUCT IF YOU NEED TO INCREASE OR KEEP IT THE SAME)  Your physician recommends that you continue on your current medications as directed. Please refer to the Current Medication list given to you today.

## 2013-06-30 NOTE — Progress Notes (Signed)
Patient ID: [name], female   DOB: [birth year], 79 y.o.   MRN: [id number]  We received today's labs with K 3.2 and Crea 1.4. We will decrease Lasix to 20 mg PO daily, give KCl 30 mEq today, followed by 10 mEq daily. Follow up labs in 2 weeks.   We will call the patient with the results. IF she is doing ok on 20 mg of Lasix we might try to stop it completely.  Ena Dawley, H 06/30/2013

## 2013-06-30 NOTE — Progress Notes (Signed)
Patient ID: [name], female   DOB: 28-Apr-1934, 79 y.o.   MRN: [id number]    Patient Name: [name] Date of Encounter: 06/30/2013  Primary Care Provider:  Anthoney Harada, MD Primary Cardiologist:  D   Patient Profile  Re-establish care, former patient of Dr Percival Spanish, LE edema, SOB  Problem List   Past Medical History  Diagnosis Date  . Nodule of right lung     Right upper lobe  . Hyperlipidemia   . Hypertension   . Hypothyroidism   . Renal insufficiency     Chronic  . Arthritis    Past Surgical History  Procedure Laterality Date  . Thyroidectomy, partial      Allergies  Allergies  Allergen Reactions  . Clonidine Derivatives   . Sulfonamide Derivatives   . Terazosin    HPI  A very pleasant 79 year old female who was previously followed by Dr. Percival Spanish. She is coming for second opinion in regards to her blood pressure management as she is intolerant to multiple medications. She brings a list of medications with specific reasons why she stopped using them, lisinopril was associated with chronic cough, she feels that since she is taking amlodipine she has developed lower extremity edema and Terazosin gives her hives. She was using clonidine in the past but she felt exhausted.  The patient states that she noticed dyspnea on exertion, and she has developed lower extremity edema ever since amlodipine has been increased to 5 mg daily.  She otherwise denies symptoms of chest pain, palpitations, or syncope. She denies orthopnea and paroxysmal nocturnal dyspnea.  Follow up after 2 weeks, the patient feels significantly better, no SOB, lower extremity edema has resolved, she diuresed 7 pounds of weight. She brings a BP diary - SBP 115-140 mmHG all the time.  Home Medications  Prior to Admission medications   Medication Sig Start Date End Date Taking? Authorizing Provider  amLODipine (NORVASC) 2.5 MG tablet TAKE 1 TABLET DAILY AS DIRECTED 05/20/13  Yes Vernie Shanks, MD  aspirin 81 MG tablet Take 81 mg by mouth daily.     Yes Historical Provider, MD  atenolol-chlorthalidone (TENORETIC) 50-25 MG per tablet Take 1 tablet by mouth daily. 01/13/13  Yes Vernie Shanks, MD  atorvastatin (LIPITOR) 20 MG tablet Take 0.5 tablets (10 mg total) by mouth daily. 05/10/13  Yes Vernie Shanks, MD  calcium-vitamin D (OSCAL WITH D) 500-200 MG-UNIT per tablet Take 1 tablet by mouth daily.     Yes Historical Provider, MD  levothyroxine (SYNTHROID, LEVOTHROID) 75 MCG tablet Take 1 tablet (75 mcg total) by mouth daily. 11/12/12  Yes Chipper Herb, MD  montelukast (SINGULAIR) 10 MG tablet Take 1 tablet (10 mg total) by mouth at bedtime. 03/02/13  Yes Vernie Shanks, MD  Omega-3 350 MG CAPS Take 1,000 mg by mouth 2 (two) times daily.    Yes Historical Provider, MD  terazosin (HYTRIN) 2 MG capsule Take 2 mg by mouth at bedtime.   Yes Historical Provider, MD    Family History  Family History  Problem Relation Age of Onset  . Thyroid disease Mother   . Stroke Father     Social History  History   Social History  . Marital Status: Married    Spouse Name: N/A    Number of Children: N/A  . Years of Education: N/A   Occupational History  . Not on file.   Social History Main Topics  . Smoking status: Never Smoker   .  Smokeless tobacco: Not on file     Comment: Pt denies cigarettes  . Alcohol Use: No  . Drug Use: No  . Sexual Activity: Not on file   Other Topics Concern  . Not on file   Social History Narrative  . No narrative on file     Review of Systems, per history of present illness otherwise negative General:  No chills, fever, night sweats or weight changes.  Cardiovascular:  No chest pain, dyspnea on exertion, edema, orthopnea, palpitations, paroxysmal nocturnal dyspnea. Dermatological: No rash, lesions/masses Respiratory: No cough, dyspnea Urologic: No hematuria, dysuria Abdominal:   No nausea, vomiting, diarrhea, bright red blood per rectum,  melena, or hematemesis Neurologic:  No visual changes, wkns, changes in mental status. All other systems reviewed and are otherwise negative except as noted above.  Physical Exam  Blood pressure 139/52, pulse 60, height 5\' 5"  (1.651 m), weight 148 lb (67.132 kg).   General: Pleasant, NAD Psych: Normal affect. Neuro: Alert and oriented X 3. Moves all extremities spontaneously. HEENT: Normal  Neck: Supple without bruits or JVD. Lungs:  Resp regular and unlabored, CTA. Heart: RRR no s3, s4, or murmurs. Abdomen: Soft, non-tender, non-distended, BS + x 4.  Extremities: No clubbing, cyanosis, no edema  Accessory Clinical Findings  ECG - sinus rhythm, 60 beats per minute, incomplete right bundle branch block otherwise normal EKG  Study Conclusions  - Left ventricle: The cavity size was normal. Wall thickness was normal. Systolic function was normal. The estimated ejection fraction was in the range of 60% to 65%. Wall motion was normal; there were no regional wall motion abnormalities. Left ventricular diastolic function parameters were normal. E' velocity is >10 cm/sec. - Mitral valve: Mildly thickened leaflets . Mild central regurgitation. - Left atrium: LA Volume/ BSA = 27.6 ml/m2 The atrium was at the upper limits of normal in size. - Tricuspid valve: Poorly visualized. Mild regurgitation. - Pulmonary arteries: RV systolic pressure: [id number] Hg (S). - Inferior vena cava: The vessel was normal in size; the respirophasic diameter changes were in the normal range (= 50%); findings are consistent with normal central venous pressure.     Assessment & Plan A very pleasant 79 year old female  1. Hypertension - patient is intolerant to multiple medications, the plan is to: D/C Terazosin (hives) D/C Amlodipine (edema) And  Start lasix 40 mg po daily  Start hydralazine 25 mg po TID  The patient is normotensive, we will check lytes and Crea/GFR and adjsut Lasix dose/KCL based on  results. For now we will decrease to 20 mg PO daily  2. Lower extremity edema - resolved, adjust Lasix dose based on results  3. Dyspnea on exertion, lower strandy edema - Oresolved, adjust Lasix dose based on results, normal systolic and diastolic function, mild pulmonary hypertension   Follow up in 6 months  Ena Dawley, Lemmie Evens, MD  06/30/2013, 9:19 AM

## 2013-07-19 ENCOUNTER — Ambulatory Visit (INDEPENDENT_AMBULATORY_CARE_PROVIDER_SITE_OTHER): Payer: Medicare Other | Admitting: Family Medicine

## 2013-07-19 ENCOUNTER — Encounter: Payer: Self-pay | Admitting: Family Medicine

## 2013-07-19 VITALS — BP 145/68 | HR 63 | Temp 97.2°F | Ht 62.0 in | Wt 147.8 lb

## 2013-07-19 DIAGNOSIS — E039 Hypothyroidism, unspecified: Secondary | ICD-10-CM

## 2013-07-19 DIAGNOSIS — R9431 Abnormal electrocardiogram [ECG] [EKG]: Secondary | ICD-10-CM

## 2013-07-19 DIAGNOSIS — J45909 Unspecified asthma, uncomplicated: Secondary | ICD-10-CM

## 2013-07-19 DIAGNOSIS — I10 Essential (primary) hypertension: Secondary | ICD-10-CM

## 2013-07-19 DIAGNOSIS — E785 Hyperlipidemia, unspecified: Secondary | ICD-10-CM

## 2013-07-19 DIAGNOSIS — D649 Anemia, unspecified: Secondary | ICD-10-CM | POA: Insufficient documentation

## 2013-07-19 DIAGNOSIS — N189 Chronic kidney disease, unspecified: Secondary | ICD-10-CM

## 2013-07-19 DIAGNOSIS — E876 Hypokalemia: Secondary | ICD-10-CM | POA: Insufficient documentation

## 2013-07-19 DIAGNOSIS — M129 Arthropathy, unspecified: Secondary | ICD-10-CM

## 2013-07-19 MED ORDER — LEVOTHYROXINE SODIUM 75 MCG PO TABS: 75.0000 ug | ORAL_TABLET | Freq: Every day | ORAL | Status: DC

## 2013-07-19 MED ORDER — ATENOLOL-CHLORTHALIDONE 50-25 MG PO TABS: 1.0000 | ORAL_TABLET | Freq: Every day | ORAL | Status: DC

## 2013-07-19 NOTE — Progress Notes (Signed)
Patient ID: [name], female   DOB: 03/22/1934, 79 y.o.   MRN: [id number] SUBJECTIVE: CC: Chief Complaint  Patient presents with  . Follow-up    3 month follow up wants to know if  can come off singulair. refill levothyroxine and tenorectic     HPI:  Patient is here for follow up of hyperlipidemia/Asthma/hypothyroidism/HTN/: denies Headache;denies Chest Pain;denies weakness;denies Shortness of Breath and orthopnea;denies Visual changes;denies palpitations;denies cough;denies pedal edema;denies symptoms of TIA or stroke;deniesClaudication symptoms. admits to Compliance with medications; denies Problems with medications.   pedal edema resolved with medication adjustment.  Past Medical History  Diagnosis Date  . Nodule of right lung     Right upper lobe  . Hyperlipidemia   . Hypertension   . Hypothyroidism   . Renal insufficiency     Chronic  . Arthritis    Past Surgical History  Procedure Laterality Date  . Thyroidectomy, partial     History   Social History  . Marital Status: Married    Spouse Name: N/A    Number of Children: N/A  . Years of Education: N/A   Occupational History  . Not on file.   Social History Main Topics  . Smoking status: Never Smoker   . Smokeless tobacco: Not on file     Comment: Pt denies cigarettes  . Alcohol Use: No  . Drug Use: No  . Sexual Activity: Not on file   Other Topics Concern  . Not on file   Social History Narrative  . No narrative on file   Family History  Problem Relation Age of Onset  . Thyroid disease Mother   . Stroke Father    Current Outpatient Prescriptions on File Prior to Visit  Medication Sig Dispense Refill  . aspirin 81 MG tablet Take 81 mg by mouth daily.        Marland Kitchen atorvastatin (LIPITOR) 20 MG tablet Take 0.5 tablets (10 mg total) by mouth daily.  30 tablet  5  . calcium-vitamin D (OSCAL WITH D) 500-200 MG-UNIT per tablet Take 1 tablet by mouth daily.        . furosemide (LASIX) 20 MG tablet Take 1  tablet (20 mg total) by mouth daily.  30 tablet  3  . hydrALAZINE (APRESOLINE) 25 MG tablet Take 1 tablet (25 mg total) by mouth 3 (three) times daily.  90 tablet  6  . montelukast (SINGULAIR) 10 MG tablet Take 1 tablet (10 mg total) by mouth at bedtime.  30 tablet  3  . Omega-3 350 MG CAPS Take 1,000 mg by mouth 2 (two) times daily.       . potassium chloride (K-DUR) 10 MEQ tablet Take 1 tablet (10 mEq total) by mouth daily.  90 tablet  3   No current facility-administered medications on file prior to visit.   Allergies  Allergen Reactions  . Clonidine Derivatives   . Sulfonamide Derivatives   . Terazosin    Immunization History  Administered Date(s) Administered  . Influenza,inj,Quad PF,36+ Mos 05/31/2013   Prior to Admission medications   Medication Sig Start Date End Date Taking? Authorizing Provider  aspirin 81 MG tablet Take 81 mg by mouth daily.     Yes Historical Provider, MD  atenolol-chlorthalidone (TENORETIC) 50-25 MG per tablet Take 1 tablet by mouth daily. 01/13/13  Yes Vernie Shanks, MD  atorvastatin (LIPITOR) 20 MG tablet Take 0.5 tablets (10 mg total) by mouth daily. 05/10/13  Yes Vernie Shanks, MD  calcium-vitamin D (  OSCAL WITH D) 500-200 MG-UNIT per tablet Take 1 tablet by mouth daily.     Yes Historical Provider, MD  furosemide (LASIX) 20 MG tablet Take 1 tablet (20 mg total) by mouth daily. 06/30/13  Yes Dorothy Spark, MD  hydrALAZINE (APRESOLINE) 25 MG tablet Take 1 tablet (25 mg total) by mouth 3 (three) times daily. 06/15/13  Yes Dorothy Spark, MD  levothyroxine (SYNTHROID, LEVOTHROID) 75 MCG tablet Take 1 tablet (75 mcg total) by mouth daily. 11/12/12  Yes Chipper Herb, MD  montelukast (SINGULAIR) 10 MG tablet Take 1 tablet (10 mg total) by mouth at bedtime. 03/02/13  Yes Vernie Shanks, MD  Omega-3 350 MG CAPS Take 1,000 mg by mouth 2 (two) times daily.    Yes Historical Provider, MD  potassium chloride (K-DUR) 10 MEQ tablet Take 1 tablet (10 mEq total)  by mouth daily. 06/30/13  Yes Dorothy Spark, MD     ROS: As above in the HPI. All other systems are stable or negative.  OBJECTIVE: APPEARANCE:  Patient in no acute distress.The patient appeared well nourished and normally developed. Acyanotic. Waist: VITAL SIGNS:BP 145/68  Pulse 63  Temp(Src) 97.2 F (36.2 C) (Oral)  Ht 5\' 2"  (1.575 m)  Wt 147 lb 12.8 oz (67.042 kg)  BMI 27.03 kg/m2  WF  SKIN: warm and  Dry without overt rashes, tattoos and scars  HEAD and Neck: without JVD, Head and scalp: normal Eyes:No scleral icterus. Fundi normal, eye movements normal. Ears: Auricle normal, canal normal, Tympanic membranes normal, insufflation normal. Nose: normal Throat: normal Neck & thyroid: normal  CHEST & LUNGS: Chest wall: normal Lungs: Clear  CVS: Reveals the PMI to be normally located. Regular rhythm, First and Second Heart sounds are normal,  absence of murmurs, rubs or gallops. Peripheral vasculature: Radial pulses: normal Dorsal pedis pulses: normal Posterior pulses: normal  ABDOMEN:  Appearance: normal Benign, no organomegaly, no masses, no Abdominal Aortic enlargement. No Guarding , no rebound. No Bruits. Bowel sounds: normal  RECTAL: N/A GU: N/A  EXTREMETIES: nonedematous.  MUSCULOSKELETAL:  Spine: normal Joints: intact  NEUROLOGIC: oriented to time,place and person; nonfocal.  ASSESSMENT:  HYPERTENSION - Plan: atenolol-chlorthalidone (TENORETIC) 50-25 MG per tablet, CMP14+EGFR  HYPERLIPIDEMIA - Plan: CMP14+EGFR, NMR, lipoprofile  HYPOTHYROIDISM - Plan: levothyroxine (SYNTHROID, LEVOTHROID) 75 MCG tablet, TSH  Asthmatic bronchitis  ABNORMAL STRESS ELECTROCARDIOGRAM  RENAL INSUFFICIENCY, CHRONIC  Anemia - Plan: Anemia Profile B, CANCELED: Anemia panel 7  ARTHRITIS  Hypokalemia - Plan: CMP14+EGFR  PLAN:  Dash Diet  Handout in AVS       Dr Paula Libra Recommendations  For nutrition information, I recommend  books:  1).Eat to Live by Dr Excell Seltzer. 2).Prevent and Reverse Heart Disease by Dr Karl Luke. 3) Dr Janene Harvey Book:  Program to Reverse Diabetes  Exercise recommendations are:  If unable to walk, then the patient can exercise in a chair 3 times a day. By flapping arms like a bird gently and raising legs outwards to the front.  If ambulatory, the patient can go for walks for 30 minutes 3 times a week. Then increase the intensity and duration as tolerated.  Goal is to try to attain exercise frequency to 5 times a week.  If applicable: Best to perform resistance exercises (machines or weights) 2 days a week and cardio type exercises 3 days per week.   Orders Placed This Encounter  Procedures  . CMP14+EGFR  . NMR, lipoprofile  . TSH  . Anemia Profile  B   Meds ordered this encounter  Medications  . atenolol-chlorthalidone (TENORETIC) 50-25 MG per tablet    Sig: Take 1 tablet by mouth daily.    Dispense:  90 tablet    Refill:  3  . levothyroxine (SYNTHROID, LEVOTHROID) 75 MCG tablet    Sig: Take 1 tablet (75 mcg total) by mouth daily.    Dispense:  90 tablet    Refill:  3   Medications Discontinued During This Encounter  Medication Reason  . atenolol-chlorthalidone (TENORETIC) 50-25 MG per tablet Reorder  . levothyroxine (SYNTHROID, LEVOTHROID) 75 MCG tablet Reorder   Return in about 3 months (around 10/17/2013) for Recheck medical problems.  Shanessa Hodak P. Jacelyn Grip, M.D.

## 2013-07-19 NOTE — Patient Instructions (Signed)
DASH Diet The DASH diet stands for "Dietary Approaches to Stop Hypertension." It is a healthy eating plan that has been shown to reduce high blood pressure (hypertension) in as little as 14 days, while also possibly providing other significant health benefits. These other health benefits include reducing the risk of breast cancer after menopause and reducing the risk of type 2 diabetes, heart disease, colon cancer, and stroke. Health benefits also include weight loss and slowing kidney failure in patients with chronic kidney disease.  DIET GUIDELINES  Limit salt (sodium). Your diet should contain less than 1500 mg of sodium daily.  Limit refined or processed carbohydrates. Your diet should include mostly whole grains. Desserts and added sugars should be used sparingly.  Include small amounts of heart-healthy fats. These types of fats include nuts, oils, and tub margarine. Limit saturated and trans fats. These fats have been shown to be harmful in the body. CHOOSING FOODS  The following food groups are based on a 2000 calorie diet. See your Registered Dietitian for individual calorie needs. Grains and Grain Products (6 to 8 servings daily)  Eat More Often: Whole-wheat bread, brown rice, whole-grain or wheat pasta, quinoa, popcorn without added fat or salt (air popped).  Eat Less Often: White bread, white pasta, white rice, cornbread. Vegetables (4 to 5 servings daily)  Eat More Often: Fresh, frozen, and canned vegetables. Vegetables may be raw, steamed, roasted, or grilled with a minimal amount of fat.  Eat Less Often/Avoid: Creamed or fried vegetables. Vegetables in a cheese sauce. Fruit (4 to 5 servings daily)  Eat More Often: All fresh, canned (in natural juice), or frozen fruits. Dried fruits without added sugar. One hundred percent fruit juice ( cup [237 mL] daily).  Eat Less Often: Dried fruits with added sugar. Canned fruit in light or heavy syrup. [employer], Fish, and Poultry (2  servings or less daily. One serving is 3 to 4 oz [85-114 g]).  Eat More Often: Ninety percent or leaner ground beef, tenderloin, sirloin. Round cuts of beef, chicken breast, [place] breast. All fish. Grill, bake, or broil your meat. Nothing should be fried.  Eat Less Often/Avoid: Fatty cuts of meat, [place], or chicken leg, thigh, or wing. Fried cuts of meat or fish. Dairy (2 to 3 servings)  Eat More Often: Low-fat or fat-free milk, low-fat plain or light yogurt, reduced-fat or part-skim cheese.  Eat Less Often/Avoid: Milk (whole, 2%).Whole milk yogurt. Full-fat cheeses. Nuts, Seeds, and Legumes (4 to 5 servings per week)  Eat More Often: All without added salt.  Eat Less Often/Avoid: Salted nuts and seeds, canned beans with added salt. Fats and Sweets (limited)  Eat More Often: Vegetable oils, tub margarines without trans fats, sugar-free gelatin. Mayonnaise and salad dressings.  Eat Less Often/Avoid: Coconut oils, palm oils, butter, stick margarine, cream, half and half, cookies, candy, pie. FOR MORE INFORMATION The Dash Diet Eating Plan: www.dashdiet.org Document Released: 07/24/2011 Document Revised: 10/27/2011 Document Reviewed: 07/24/2011 [facility] Patient Information 2014 [place], [place].        Dr Paula Libra Recommendations  For nutrition information, I recommend books:  1).Eat to Live by Dr Excell Seltzer. 2).Prevent and Reverse Heart Disease by Dr Karl Luke. 3) Dr Janene Harvey Book:  Program to Reverse Diabetes  Exercise recommendations are:  If unable to walk, then the patient can exercise in a chair 3 times a day. By flapping arms like a bird gently and raising legs outwards to the front.  If ambulatory, the patient can go for  walks for 30 minutes 3 times a week. Then increase the intensity and duration as tolerated.  Goal is to try to attain exercise frequency to 5 times a week.  If applicable: Best to perform resistance exercises (machines or  weights) 2 days a week and cardio type exercises 3 days per week.

## 2013-07-20 ENCOUNTER — Ambulatory Visit: Payer: Medicare Other | Admitting: Family Medicine

## 2013-07-21 LAB — ANEMIA PROFILE B
Basophils Absolute: 0 10*3/uL (ref 0.0–0.2)
Basos: 0 %
Eos: 2 %
Eosinophils Absolute: 0.1 10*3/uL (ref 0.0–0.4)
Ferritin: 99 ng/mL (ref 15–150)
Folate: 18.9 ng/mL (ref >3.0)
HCT: 32 % — ABNORMAL LOW (ref 34.0–46.6)
Hemoglobin: 10.5 g/dL — ABNORMAL LOW (ref 11.1–15.9)
Immature Grans (Abs): 0 10*3/uL (ref 0.0–0.1)
Immature Granulocytes: 0 %
Iron Saturation: 16 % (ref 15–55)
Iron: 46 ug/dL (ref 35–155)
Lymphocytes Absolute: 1.1 10*3/uL (ref 0.7–3.1)
Lymphs: 29 %
MCH: 27.6 pg (ref 26.6–33.0)
MCHC: 32.8 g/dL (ref 31.5–35.7)
MCV: 84 fL (ref 79–97)
Monocytes Absolute: 0.5 10*3/uL (ref 0.1–0.9)
Monocytes: 13 %
Neutrophils Absolute: 2.1 10*3/uL (ref 1.4–7.0)
Neutrophils Relative %: 56 %
Platelets: 200 10*3/uL (ref 150–379)
RBC: 3.8 x10E6/uL (ref 3.77–5.28)
RDW: 13.8 % (ref 12.3–15.4)
Retic Ct Pct: 1.6 % (ref 0.6–2.6)
TIBC: 282 ug/dL (ref 250–450)
UIBC: 236 ug/dL (ref 150–375)
Vitamin B-12: 463 pg/mL (ref 211–946)
WBC: 3.7 10*3/uL (ref 3.4–10.8)

## 2013-07-21 LAB — CMP14+EGFR
ALT: 18 IU/L (ref 0–32)
AST: 21 IU/L (ref 0–40)
Albumin/Globulin Ratio: 1.8 (ref 1.1–2.5)
Albumin: 4.4 g/dL (ref 3.5–4.8)
Alkaline Phosphatase: 71 IU/L (ref 39–117)
BUN/Creatinine Ratio: 11 (ref 11–26)
BUN: 17 mg/dL (ref 8–27)
CO2: 29 mmol/L (ref 18–29)
Calcium: 9.3 mg/dL (ref 8.6–10.2)
Chloride: 97 mmol/L (ref 97–108)
Creatinine, Ser: 1.5 mg/dL — ABNORMAL HIGH (ref 0.57–1.00)
GFR calc Af Amer: 38 mL/min/{1.73_m2} — ABNORMAL LOW (ref >59)
GFR calc non Af Amer: 33 mL/min/{1.73_m2} — ABNORMAL LOW (ref >59)
Globulin, Total: 2.5 g/dL (ref 1.5–4.5)
Glucose: 82 mg/dL (ref 65–99)
Potassium: 3.8 mmol/L (ref 3.5–5.2)
Sodium: 140 mmol/L (ref 134–144)
Total Bilirubin: 0.5 mg/dL (ref 0.0–1.2)
Total Protein: 6.9 g/dL (ref 6.0–8.5)

## 2013-07-21 LAB — NMR, LIPOPROFILE
Cholesterol: 124 mg/dL (ref <200)
HDL Cholesterol by NMR: 38 mg/dL — ABNORMAL LOW (ref >=40)
HDL Particle Number: 29.1 umol/L — ABNORMAL LOW (ref >=30.5)
LDL Particle Number: 1134 nmol/L — ABNORMAL HIGH (ref <1000)
LDL Size: 20.6 nm (ref >20.5)
LDLC SERPL CALC-MCNC: 55 mg/dL (ref <100)
LP-IR Score: 47 — ABNORMAL HIGH (ref <=45)
Small LDL Particle Number: 757 nmol/L — ABNORMAL HIGH (ref <=527)
Triglycerides by NMR: 155 mg/dL — ABNORMAL HIGH (ref <150)

## 2013-07-21 LAB — TSH: TSH: 2 u[IU]/mL (ref 0.450–4.500)

## 2013-07-22 ENCOUNTER — Other Ambulatory Visit: Payer: Self-pay | Admitting: Family Medicine

## 2013-09-07 ENCOUNTER — Ambulatory Visit (INDEPENDENT_AMBULATORY_CARE_PROVIDER_SITE_OTHER): Payer: Medicare Other

## 2013-09-07 DIAGNOSIS — Z23 Encounter for immunization: Secondary | ICD-10-CM

## 2013-09-09 ENCOUNTER — Other Ambulatory Visit: Payer: Self-pay | Admitting: Family Medicine

## 2013-09-16 ENCOUNTER — Telehealth: Payer: Self-pay | Admitting: Cardiology

## 2013-09-16 NOTE — Telephone Encounter (Signed)
Pt is advised to take her 2:30 dose at lunch time at around 12:30 to try to help her remember to take her midday dose, she verbalized understanding and agrees with plan.

## 2013-09-16 NOTE — Telephone Encounter (Signed)
New Message  Pt callled states that she is having problems with the dosages of her medication// she forgets to take her dose at 2:30 in the afternoon.. she is requesting a call back to discuss.

## 2013-09-28 ENCOUNTER — Other Ambulatory Visit: Payer: Self-pay | Admitting: Dermatology

## 2013-10-14 ENCOUNTER — Other Ambulatory Visit: Payer: Self-pay | Admitting: Nephrology

## 2013-10-14 DIAGNOSIS — N189 Chronic kidney disease, unspecified: Secondary | ICD-10-CM

## 2013-10-19 ENCOUNTER — Other Ambulatory Visit: Payer: Self-pay | Admitting: Family Medicine

## 2013-10-20 ENCOUNTER — Other Ambulatory Visit: Payer: Self-pay

## 2013-10-20 DIAGNOSIS — I10 Essential (primary) hypertension: Secondary | ICD-10-CM

## 2013-10-20 MED ORDER — FUROSEMIDE 20 MG PO TABS: 20.0000 mg | ORAL_TABLET | Freq: Every day | ORAL | Status: DC

## 2013-11-01 ENCOUNTER — Ambulatory Visit: Payer: Medicare Other | Admitting: Family Medicine

## 2013-12-02 ENCOUNTER — Ambulatory Visit: Payer: Medicare Other | Admitting: Family Medicine

## 2013-12-06 ENCOUNTER — Encounter: Payer: Self-pay | Admitting: Family Medicine

## 2013-12-06 ENCOUNTER — Ambulatory Visit (INDEPENDENT_AMBULATORY_CARE_PROVIDER_SITE_OTHER): Payer: Medicare Other | Admitting: Family Medicine

## 2013-12-06 VITALS — BP 184/73 | HR 65 | Temp 97.4°F | Ht 62.0 in | Wt 140.6 lb

## 2013-12-06 DIAGNOSIS — N189 Chronic kidney disease, unspecified: Secondary | ICD-10-CM

## 2013-12-06 DIAGNOSIS — M129 Arthropathy, unspecified: Secondary | ICD-10-CM

## 2013-12-06 DIAGNOSIS — R9431 Abnormal electrocardiogram [ECG] [EKG]: Secondary | ICD-10-CM

## 2013-12-06 DIAGNOSIS — I10 Essential (primary) hypertension: Secondary | ICD-10-CM

## 2013-12-06 DIAGNOSIS — E785 Hyperlipidemia, unspecified: Secondary | ICD-10-CM

## 2013-12-06 DIAGNOSIS — E039 Hypothyroidism, unspecified: Secondary | ICD-10-CM

## 2013-12-06 DIAGNOSIS — J45909 Unspecified asthma, uncomplicated: Secondary | ICD-10-CM

## 2013-12-06 NOTE — Progress Notes (Signed)
Patient ID: [name], female   DOB: March 29, 1934, 80 y.o.   MRN: [id number] SUBJECTIVE: CC: Chief Complaint  Patient presents with  . Follow-up    3 month follow up chronic problems . wants to come diuretic    HPI:  Patient is here for follow up of hyperlipidemia/HTN/CKD/Arthritis: denies Headache;denies Chest Pain;denies weakness;denies Shortness of Breath and orthopnea;denies Visual changes;denies palpitations;denies cough;denies pedal edema;denies symptoms of TIA or stroke;deniesClaudication symptoms. Compliance with medications; stopped her diuretic on her own.since stopping her furosemide , her perfectly controlled BP jumped up . This am it was 235 systolic. Before her systolics were 573-220. She thought she wanted to give  atry without it to see what would happen. She took a dose prior to coming to the clinic to restart. denies Problems with medications.  Sees Dr Mercy Moore for her kidneys.had a scan which showed some stones in the kidneys.      Past Medical History  Diagnosis Date  . Nodule of right lung     Right upper lobe  . Hyperlipidemia   . Hypertension   . Hypothyroidism   . Renal insufficiency     Chronic  . Arthritis    Past Surgical History  Procedure Laterality Date  . Thyroidectomy, partial     History   Social History  . Marital Status: Married    Spouse Name: N/A    Number of Children: N/A  . Years of Education: N/A   Occupational History  . Not on file.   Social History Main Topics  . Smoking status: Never Smoker   . Smokeless tobacco: Not on file     Comment: Pt denies cigarettes  . Alcohol Use: No  . Drug Use: No  . Sexual Activity: Not on file   Other Topics Concern  . Not on file   Social History Narrative  . No narrative on file   Family History  Problem Relation Age of Onset  . Thyroid disease Mother   . Stroke Father    Current Outpatient Prescriptions on File Prior to Visit  Medication Sig Dispense Refill  . aspirin 81  MG tablet Take 81 mg by mouth daily.        Marland Kitchen atenolol-chlorthalidone (TENORETIC) 50-25 MG per tablet Take 1 tablet by mouth daily.  90 tablet  3  . atorvastatin (LIPITOR) 20 MG tablet Take 0.5 tablets (10 mg total) by mouth daily.  30 tablet  5  . calcium-vitamin D (OSCAL WITH D) 500-200 MG-UNIT per tablet Take 1 tablet by mouth daily.        . furosemide (LASIX) 20 MG tablet Take 1 tablet (20 mg total) by mouth daily.  30 tablet  3  . hydrALAZINE (APRESOLINE) 25 MG tablet Take 1 tablet (25 mg total) by mouth 3 (three) times daily.  90 tablet  6  . levothyroxine (SYNTHROID, LEVOTHROID) 75 MCG tablet Take 1 tablet (75 mcg total) by mouth daily.  90 tablet  3  . montelukast (SINGULAIR) 10 MG tablet Take 1 tablet (10 mg total) by mouth at bedtime.  30 tablet  3  . Omega-3 350 MG CAPS Take 1,000 mg by mouth 2 (two) times daily.       . potassium chloride (K-DUR) 10 MEQ tablet Take 1 tablet (10 mEq total) by mouth daily.  90 tablet  3   No current facility-administered medications on file prior to visit.   Allergies  Allergen Reactions  . Clonidine Derivatives   . Sulfonamide Derivatives   .  Terazosin    Immunization History  Administered Date(s) Administered  . Influenza,inj,Quad PF,36+ Mos 05/31/2013  . Pneumococcal Conjugate-13 09/07/2013   Prior to Admission medications   Medication Sig Start Date End Date Taking? Authorizing Provider  aspirin 81 MG tablet Take 81 mg by mouth daily.     Yes Historical Provider, MD  atenolol-chlorthalidone (TENORETIC) 50-25 MG per tablet Take 1 tablet by mouth daily. 07/19/13  Yes Vernie Shanks, MD  atorvastatin (LIPITOR) 20 MG tablet Take 0.5 tablets (10 mg total) by mouth daily. 05/10/13  Yes Vernie Shanks, MD  calcium-vitamin D (OSCAL WITH D) 500-200 MG-UNIT per tablet Take 1 tablet by mouth daily.     Yes Historical Provider, MD  furosemide (LASIX) 20 MG tablet Take 1 tablet (20 mg total) by mouth daily. 10/20/13  Yes Dorothy Spark, MD   hydrALAZINE (APRESOLINE) 25 MG tablet Take 1 tablet (25 mg total) by mouth 3 (three) times daily. 06/15/13  Yes Dorothy Spark, MD  levothyroxine (SYNTHROID, LEVOTHROID) 75 MCG tablet Take 1 tablet (75 mcg total) by mouth daily. 07/19/13  Yes Vernie Shanks, MD  montelukast (SINGULAIR) 10 MG tablet Take 1 tablet (10 mg total) by mouth at bedtime. 03/02/13  Yes Vernie Shanks, MD  Omega-3 350 MG CAPS Take 1,000 mg by mouth 2 (two) times daily.    Yes Historical Provider, MD  potassium chloride (K-DUR) 10 MEQ tablet Take 1 tablet (10 mEq total) by mouth daily. 06/30/13  Yes Dorothy Spark, MD     ROS: As above in the HPI. All other systems are stable or negative.  OBJECTIVE: APPEARANCE:  Patient in no acute distress.The patient appeared well nourished and normally developed. Acyanotic. Waist: VITAL SIGNS:BP 184/73  Pulse 65  Temp(Src) 97.4 F (36.3 C) (Oral)  Ht 5' 2" (1.575 m)  Wt 140 lb 9.6 oz (63.776 kg)  BMI 25.71 kg/m2 WF Recheck BP 175/82  SKIN: warm and  Dry without overt rashes, tattoos and scars  HEAD and Neck: without JVD, Head and scalp: normal Eyes:No scleral icterus. Fundi normal, eye movements normal. Ears: Auricle normal, canal normal, Tympanic membranes normal, insufflation normal. Nose: normal Throat: normal Neck & thyroid: normal  CHEST & LUNGS: Chest wall: normal Lungs: Clear  CVS: Reveals the PMI to be normally located. Regular rhythm, First and Second Heart sounds are normal,  absence of murmurs, rubs or gallops. Peripheral vasculature: Radial pulses: normal Dorsal pedis pulses: normal Posterior pulses: normal  ABDOMEN:  Appearance: normal Benign, no organomegaly, no masses, no Abdominal Aortic enlargement. No Guarding , no rebound. No Bruits. Bowel sounds: normal  RECTAL: N/A GU: N/A  EXTREMETIES: nonedematous.  MUSCULOSKELETAL:  Spine: normal Joints: intact  NEUROLOGIC: oriented to time,place and person; nonfocal. Strength is  normal Sensory is normal Reflexes are normal Cranial Nerves are normal.  Results for orders placed in visit on 07/19/13  CMP14+EGFR      Result Value Ref Range   Glucose 82  65 - 99 mg/dL   BUN 17  8 - 27 mg/dL   Creatinine, Ser 1.50 (*) 0.57 - 1.00 mg/dL   GFR calc non Af Amer 33 (*) >59 mL/min/1.73   GFR calc Af Amer 38 (*) >59 mL/min/1.73   BUN/Creatinine Ratio 11  11 - 26   Sodium 140  134 - 144 mmol/L   Potassium 3.8  3.5 - 5.2 mmol/L   Chloride 97  97 - 108 mmol/L   CO2 29  18 - 29 mmol/L  Calcium 9.3  8.6 - 10.2 mg/dL   Total Protein 6.9  6.0 - 8.5 g/dL   Albumin 4.4  3.5 - 4.8 g/dL   Globulin, Total 2.5  1.5 - 4.5 g/dL   Albumin/Globulin Ratio 1.8  1.1 - 2.5   Total Bilirubin 0.5  0.0 - 1.2 mg/dL   Alkaline Phosphatase 71  39 - 117 IU/L   AST 21  0 - 40 IU/L   ALT 18  0 - 32 IU/L  NMR, LIPOPROFILE      Result Value Ref Range   LDL Particle Number [id number] (*) <1000 nmol/L   LDLC SERPL CALC-MCNC 55  <100 mg/dL   HDL Cholesterol by NMR 38 (*) >=40 mg/dL   Triglycerides by NMR 155 (*) <150 mg/dL   Cholesterol 124  <200 mg/dL   HDL Particle Number [id number] (*) >=30.5 umol/L   Small LDL Particle Number [id number] (*) <=527 nmol/L   LDL Size 20.6  >20.5 nm   LP-IR Score 47 (*) <=45  TSH      Result Value Ref Range   TSH 2.000  0.450 - 4.500 uIU/mL  ANEMIA PROFILE B      Result Value Ref Range   TIBC 282  250 - 450 ug/dL   UIBC 236  150 - 375 ug/dL   Iron 46  35 - 155 ug/dL   Iron Saturation 16  15 - 55 %   Ferritin 99  15 - 150 ng/mL   Vitamin B-12 463  211 - 946 pg/mL   Folate 18.9  >3.0 ng/mL   WBC 3.7  3.4 - 10.8 x10E3/uL   RBC 3.80  3.77 - 5.28 x10E6/uL   Hemoglobin 10.5 (*) 11.1 - 15.9 g/dL   HCT 32.0 (*) 34.0 - 46.6 %   MCV 84  79 - 97 fL   MCH 27.6  26.6 - 33.0 pg   MCHC 32.8  31.5 - 35.7 g/dL   RDW 13.8  12.3 - 15.4 %   Platelets 200  150 - 379 x10E3/uL   Neutrophils Relative % 56     Lymphs 29     Monocytes 13     Eos 2     Basos 0     Neutrophils Absolute  2.1  1.4 - 7.0 x10E3/uL   Lymphocytes Absolute 1.1  0.7 - 3.1 x10E3/uL   Monocytes Absolute 0.5  0.1 - 0.9 x10E3/uL   Eosinophils Absolute 0.1  0.0 - 0.4 x10E3/uL   Basophils Absolute 0.0  0.0 - 0.2 x10E3/uL   Immature Granulocytes 0     Immature Grans (Abs) 0.0  0.0 - 0.1 x10E3/uL   Retic Ct Pct 1.6  0.6 - 2.6 %    ASSESSMENT: RENAL INSUFFICIENCY, CHRONIC - Plan: CMP14+EGFR  HYPOTHYROIDISM - Plan: TSH  HYPERTENSION - Plan: CMP14+EGFR  HYPERLIPIDEMIA - Plan: NMR, lipoprofile  ABNORMAL STRESS ELECTROCARDIOGRAM  Asthmatic bronchitis  ARTHRITIS  PLAN:  Restart the furosemide as directed Advised not to stop her medications on her own without supervision. Advised that non-compliance can have serious and even fatal consequences. DASH diet in the AVS.   Orders Placed This Encounter  Procedures  . CMP14+EGFR  . NMR, lipoprofile  . TSH   No orders of the defined types were placed in this encounter.   Medications Discontinued During This Encounter  Medication Reason  . atenolol-chlorthalidone (TENORETIC) 50-25 MG per tablet Duplicate   Return in about 2 days (around 12/08/2013) for recheck BP.  Kaynen Minner P. Jacelyn Grip, M.D.

## 2013-12-06 NOTE — Patient Instructions (Signed)
DASH Diet  The DASH diet stands for "Dietary Approaches to Stop Hypertension." It is a healthy eating plan that has been shown to reduce high blood pressure (hypertension) in as little as 14 days, while also possibly providing other significant health benefits. These other health benefits include reducing the risk of breast cancer after menopause and reducing the risk of type 2 diabetes, heart disease, colon cancer, and stroke. Health benefits also include weight loss and slowing kidney failure in patients with chronic kidney disease.   DIET GUIDELINES  · Limit salt (sodium). Your diet should contain less than 1500 mg of sodium daily.  · Limit refined or processed carbohydrates. Your diet should include mostly whole grains. Desserts and added sugars should be used sparingly.  · Include small amounts of heart-healthy fats. These types of fats include nuts, oils, and tub margarine. Limit saturated and trans fats. These fats have been shown to be harmful in the body.  CHOOSING FOODS   The following food groups are based on a 2000 calorie diet. See your Registered Dietitian for individual calorie needs.  Grains and Grain Products (6 to 8 servings daily)  · Eat More Often: Whole-wheat bread, brown rice, whole-grain or wheat pasta, quinoa, popcorn without added fat or salt (air popped).  · Eat Less Often: White bread, white pasta, white rice, cornbread.  Vegetables (4 to 5 servings daily)  · Eat More Often: Fresh, frozen, and canned vegetables. Vegetables may be raw, steamed, roasted, or grilled with a minimal amount of fat.  · Eat Less Often/Avoid: Creamed or fried vegetables. Vegetables in a cheese sauce.  Fruit (4 to 5 servings daily)  · Eat More Often: All fresh, canned (in natural juice), or frozen fruits. Dried fruits without added sugar. One hundred percent fruit juice (½ cup [237 mL] daily).  · Eat Less Often: Dried fruits with added sugar. Canned fruit in light or heavy syrup.  Lean Meats, Fish, and Poultry (2  servings or less daily. One serving is 3 to 4 oz [85-114 g]).  · Eat More Often: Ninety percent or leaner ground beef, tenderloin, sirloin. Round cuts of beef, chicken breast, turkey breast. All fish. Grill, bake, or broil your meat. Nothing should be fried.  · Eat Less Often/Avoid: Fatty cuts of meat, turkey, or chicken leg, thigh, or wing. Fried cuts of meat or fish.  Dairy (2 to 3 servings)  · Eat More Often: Low-fat or fat-free milk, low-fat plain or light yogurt, reduced-fat or part-skim cheese.  · Eat Less Often/Avoid: Milk (whole, 2%). Whole milk yogurt. Full-fat cheeses.  Nuts, Seeds, and Legumes (4 to 5 servings per week)  · Eat More Often: All without added salt.  · Eat Less Often/Avoid: Salted nuts and seeds, canned beans with added salt.  Fats and Sweets (limited)  · Eat More Often: Vegetable oils, tub margarines without trans fats, sugar-free gelatin. Mayonnaise and salad dressings.  · Eat Less Often/Avoid: Coconut oils, palm oils, butter, stick margarine, cream, half and half, cookies, candy, pie.  FOR MORE INFORMATION  The Dash Diet Eating Plan: www.dashdiet.org  Document Released: 07/24/2011 Document Revised: 10/27/2011 Document Reviewed: 07/24/2011  [employer]® Patient Information ©2014 [employer].

## 2013-12-07 LAB — CMP14+EGFR
ALT: 16 IU/L (ref 0–32)
AST: 20 IU/L (ref 0–40)
Albumin/Globulin Ratio: 1.9 (ref 1.1–2.5)
Albumin: 4.7 g/dL (ref 3.5–4.8)
Alkaline Phosphatase: 72 IU/L (ref 39–117)
BUN/Creatinine Ratio: 14 (ref 11–26)
BUN: 19 mg/dL (ref 8–27)
CO2: 31 mmol/L — ABNORMAL HIGH (ref 18–29)
Calcium: 9.7 mg/dL (ref 8.7–10.3)
Chloride: 94 mmol/L — ABNORMAL LOW (ref 97–108)
Creatinine, Ser: 1.37 mg/dL — ABNORMAL HIGH (ref 0.57–1.00)
GFR calc Af Amer: 43 mL/min/{1.73_m2} — ABNORMAL LOW (ref >59)
GFR calc non Af Amer: 37 mL/min/{1.73_m2} — ABNORMAL LOW (ref >59)
Globulin, Total: 2.5 g/dL (ref 1.5–4.5)
Glucose: 107 mg/dL — ABNORMAL HIGH (ref 65–99)
Potassium: 3.9 mmol/L (ref 3.5–5.2)
Sodium: 140 mmol/L (ref 134–144)
Total Bilirubin: 0.5 mg/dL (ref 0.0–1.2)
Total Protein: 7.2 g/dL (ref 6.0–8.5)

## 2013-12-07 LAB — TSH: TSH: 4.1 u[IU]/mL (ref 0.450–4.500)

## 2013-12-07 LAB — NMR, LIPOPROFILE
Cholesterol: 146 mg/dL (ref <200)
HDL Cholesterol by NMR: 45 mg/dL (ref >=40)
HDL Particle Number: 29.9 umol/L — ABNORMAL LOW (ref >=30.5)
LDL Particle Number: 784 nmol/L (ref <1000)
LDL Size: 20.8 nm (ref >20.5)
LP-IR Score: 25 (ref <=45)
Small LDL Particle Number: 350 nmol/L (ref <=527)
Triglycerides by NMR: 158 mg/dL — ABNORMAL HIGH (ref <150)

## 2013-12-08 ENCOUNTER — Telehealth: Payer: Self-pay | Admitting: Family Medicine

## 2013-12-08 ENCOUNTER — Ambulatory Visit (INDEPENDENT_AMBULATORY_CARE_PROVIDER_SITE_OTHER): Payer: Medicare Other | Admitting: *Deleted

## 2013-12-08 VITALS — BP 153/67 | HR 54

## 2013-12-08 DIAGNOSIS — I10 Essential (primary) hypertension: Secondary | ICD-10-CM

## 2013-12-08 NOTE — Patient Instructions (Signed)

## 2013-12-08 NOTE — Progress Notes (Signed)
Patient came in for a 2 day BP rck PER Dr. Jacelyn Grip. BP 153/67  Pulse 54. Patient was advised that I will send this reading over to Dr. Jacelyn Grip and that he would contact her if any changes needed to be made.

## 2013-12-09 NOTE — Telephone Encounter (Signed)
Good no change in medications. Do not stop her medications in the future without supervision.

## 2013-12-09 NOTE — Progress Notes (Signed)
BP better. But not at goal. Recheck BP next week.

## 2013-12-21 ENCOUNTER — Telehealth: Payer: Self-pay | Admitting: Family Medicine

## 2013-12-21 NOTE — Telephone Encounter (Signed)
appt given for 7/13 with Moore. Per Roselyn Reef ok to schedule with Laurance Flatten

## 2014-01-04 ENCOUNTER — Telehealth: Payer: Self-pay | Admitting: Cardiology

## 2014-01-04 NOTE — Telephone Encounter (Signed)
Left message to call back.  Machine did not identify patient therefore I did not leave a detailed message.  Dr. Mare Ferrari out of the office and will not be back in the office until Wednesday next week. Per Synetta Fail patient just wants to change but no reason given except was told by someone she should see  Dr. Mare Ferrari

## 2014-01-04 NOTE — Telephone Encounter (Signed)
New Message  Pt called states that she has had problems recently with AFIB. Pt is requesting to continue care with Dr. Mare Ferrari verses Dr. Meda Coffee. Pt states she will not make an appt until the transition is complete. Please advise.

## 2014-01-05 NOTE — Telephone Encounter (Signed)
Will forward this to Dr Mare Ferrari and nurse to make them aware.

## 2014-01-05 NOTE — Telephone Encounter (Signed)
Ivy, That's fine with me, she was also Dr Hochrein's patient in the past, maybe she can see him before Dr Mare Ferrari returns? Thank you, K

## 2014-01-05 NOTE — Telephone Encounter (Signed)
Lmtcb to discuss with pt reasons for wanting to switch Doctors.  Will forward this message to Dr Meda Coffee to make her aware.

## 2014-01-06 NOTE — Telephone Encounter (Signed)
Since we don't have any available new patient appointments for awhile it will probably be better for patient to be seen sooner by Dr. Meda Coffee or Dr. Percival Spanish.

## 2014-01-06 NOTE — Telephone Encounter (Signed)
Shanice in scheduling to get appointment for patient.

## 2014-01-06 NOTE — Telephone Encounter (Signed)
Spoke with patient and explained that  Dr. Mare Ferrari out of the office and could not transfer without an ok from him. Patient stated that she had originally been given Hydralazine by Dr Meda Coffee and her nephrologist had increased her dose. She had a follow up appointment with him this week and explained that she had a fast heart rate a couple of times since increasing the medication that lasted about an hour. Per patient nephrologist indicated that it could be AFib and to follow up with cardiologist. She was concerned that the Hydralazine could be causing since tachycardia was a side effect. I explained that Afib and tachycardia were not the same thing and that she did need to follow up soon. Nephrologist advised if happened again to get seen for EKG, I advised to go to ED. Patient was under the impression that Dr Meda Coffee was a PA, advised she was indeed a physician and that different people react differently to different medications. She did agree to get a follow up appointment with Dr Meda Coffee, I tried to transfer the patient to scheduling and patient hung up before I could do so. Spoke with [place] in scheduling and she stated she would call the patient.

## 2014-01-10 ENCOUNTER — Other Ambulatory Visit: Payer: Self-pay

## 2014-01-10 DIAGNOSIS — E785 Hyperlipidemia, unspecified: Secondary | ICD-10-CM

## 2014-01-10 MED ORDER — ATORVASTATIN CALCIUM 20 MG PO TABS: 10.0000 mg | ORAL_TABLET | Freq: Every day | ORAL | Status: DC

## 2014-01-12 ENCOUNTER — Ambulatory Visit (INDEPENDENT_AMBULATORY_CARE_PROVIDER_SITE_OTHER): Payer: Medicare Other | Admitting: Family

## 2014-01-12 ENCOUNTER — Encounter: Payer: Self-pay | Admitting: Family

## 2014-01-12 VITALS — BP 144/93 | HR 114 | Temp 99.1°F | Ht 62.0 in | Wt 140.2 lb

## 2014-01-12 DIAGNOSIS — R000 Tachycardia, unspecified: Secondary | ICD-10-CM

## 2014-01-12 NOTE — Progress Notes (Signed)
   Subjective:    Patient ID: [name], female    DOB: 23-Jun-1934, 80 y.o.   MRN: [id number]  HPI Pt presents to the office with tachycardia. Pt states she feels her heart racing intermittently. Pt states this started back in November after being placed on hydralazine 50mg  BID from her Cardiologist. Pt also sees a nephrologist who adjusts her blood pressure medications.   She has a long hx of adjusting blood pressure medications due to adverse reactions (ACE-cough, Amlodipine-swelling, Terazosin-hives, and Clonidine- fatigue).    Review of Systems  Cardiovascular: Positive for palpitations.  Neurological: Negative for syncope and weakness.  All other systems reviewed and are negative.      Objective:   Physical Exam  Vitals reviewed. Constitutional: She is oriented to person, place, and time. She appears well-developed and well-nourished.  Cardiovascular: Normal rate, regular rhythm, normal heart sounds and intact distal pulses.   No murmur heard. Pulmonary/Chest: Breath sounds normal. No respiratory distress.  Abdominal: Soft. Bowel sounds are normal. She exhibits no distension. There is no tenderness.  Musculoskeletal: Normal range of motion. She exhibits no edema.  Neurological: She is alert and oriented to person, place, and time.  Skin: Skin is warm and dry.  Psychiatric: She has a normal mood and affect. Her behavior is normal. Judgment and thought content normal.     BP 144/93  Pulse 114  Temp(Src) 99.1 F (37.3 C) (Oral)  Ht 5\' 2"  (1.575 m)  Wt 140 lb 3.2 oz (63.594 kg)  BMI 25.64 kg/m2      Assessment & Plan:  1. Tachycardia -Keep appointment with cardiologist-This only happens every 4-5 weeks-Could prescribe some type of prn med when it occurs? -Avoid caffeine and alcohol -Rest - EKG 12-Lead   [name], FNP

## 2014-01-12 NOTE — Patient Instructions (Signed)
Nonspecific Tachycardia Tachycardia is a faster than normal heartbeat (more than 100 beats per minute). In adults, the heart normally beats between 60 and 100 times a minute. A fast heartbeat may be a normal response to exercise or stress. It does not necessarily mean that something is wrong. However, sometimes when your heart beats too fast it may not be able to pump enough blood to the rest of your body. This can result in chest pain, shortness of breath, dizziness, and even fainting. Nonspecific tachycardia means that the specific cause or pattern of your tachycardia is unknown. CAUSES  Tachycardia may be harmless or it may be due to a more serious underlying cause. Possible causes of tachycardia include:  Exercise or exertion.  Fever.  Pain or injury.  Infection.  Loss of body fluids (dehydration).  Overactive thyroid.  Lack of red blood cells (anemia).  Anxiety and stress.  Alcohol.  Caffeine.  Tobacco products.  Diet pills.  Illegal drugs.  Heart disease. SYMPTOMS  Rapid or irregular heartbeat (palpitations).  Suddenly feeling your heart beating (cardiac awareness).  Dizziness.  Tiredness (fatigue).  Shortness of breath.  Chest pain.  Nausea.  Fainting. DIAGNOSIS  Your caregiver will perform a physical exam and take your medical history. In some cases, a heart specialist (cardiologist) may be consulted. Your caregiver may also order:  Blood tests.  Electrocardiography. This test records the electrical activity of your heart.  A heart monitoring test. TREATMENT  Treatment will depend on the likely cause of your tachycardia. The goal is to treat the underlying cause of your tachycardia. Treatment methods may include:  Replacement of fluids or blood through an intravenous (IV) tube for moderate to severe dehydration or anemia.  New medicines or changes in your current medicines.  Diet and lifestyle changes.  Treatment for certain  infections.  Stress relief or relaxation methods. HOME CARE INSTRUCTIONS   Rest.  Drink enough fluids to keep your urine clear or pale yellow.  Do not smoke.  Avoid:  Caffeine.  Tobacco.  Alcohol.  Chocolate.  Stimulants such as over-the-counter diet pills or pills that help you stay awake.  Situations that cause anxiety or stress.  Illegal drugs such as marijuana, phencyclidine (PCP), and cocaine.  Only take medicine as directed by your caregiver.  Keep all follow-up appointments as directed by your caregiver. SEEK IMMEDIATE MEDICAL CARE IF:   You have pain in your chest, upper arms, jaw, or neck.  You become weak, dizzy, or feel faint.  You have palpitations that will not go away.  You vomit, have diarrhea, or pass blood in your stool.  Your skin is cool, pale, and wet.  You have a fever that will not go away with rest, fluids, and medicine. MAKE SURE YOU:   Understand these instructions.  Will watch your condition.  Will get help right away if you are not doing well or get worse. Document Released: 09/11/2004 Document Revised: 10/27/2011 Document Reviewed: 07/15/2011 [employer] Patient Information 2014 [employer].  

## 2014-01-24 ENCOUNTER — Ambulatory Visit (INDEPENDENT_AMBULATORY_CARE_PROVIDER_SITE_OTHER): Payer: Medicare Other | Admitting: Cardiology

## 2014-01-24 ENCOUNTER — Encounter: Payer: Self-pay | Admitting: Cardiology

## 2014-01-24 VITALS — BP 158/68 | HR 52 | Ht 66.0 in | Wt 139.0 lb

## 2014-01-24 DIAGNOSIS — E8779 Other fluid overload: Secondary | ICD-10-CM

## 2014-01-24 DIAGNOSIS — E785 Hyperlipidemia, unspecified: Secondary | ICD-10-CM

## 2014-01-24 DIAGNOSIS — I480 Paroxysmal atrial fibrillation: Secondary | ICD-10-CM

## 2014-01-24 DIAGNOSIS — I10 Essential (primary) hypertension: Secondary | ICD-10-CM

## 2014-01-24 DIAGNOSIS — R079 Chest pain, unspecified: Secondary | ICD-10-CM

## 2014-01-24 DIAGNOSIS — R0602 Shortness of breath: Secondary | ICD-10-CM

## 2014-01-24 DIAGNOSIS — I4891 Unspecified atrial fibrillation: Secondary | ICD-10-CM

## 2014-01-24 LAB — BASIC METABOLIC PANEL
BUN: 22 mg/dL (ref 6–23)
CO2: 32 mEq/L (ref 19–32)
Calcium: 9.7 mg/dL (ref 8.4–10.5)
Chloride: 96 mEq/L (ref 96–112)
Creatinine, Ser: 1.4 mg/dL — ABNORMAL HIGH (ref 0.4–1.2)
GFR: 37.71 mL/min — ABNORMAL LOW (ref >60.00)
Glucose, Bld: 99 mg/dL (ref 70–99)
Potassium: 3.6 mEq/L (ref 3.5–5.1)
Sodium: 138 mEq/L (ref 135–145)

## 2014-01-24 MED ORDER — ISOSORBIDE MONONITRATE ER 60 MG PO TB24: 60.0000 mg | ORAL_TABLET | Freq: Every day | ORAL | Status: DC

## 2014-01-24 NOTE — Progress Notes (Signed)
Patient ID: [name], female   DOB: 09-18-1933, 80 y.o.   MRN: [id number]    Patient Name: [name] Date of Encounter: 01/24/2014  Primary Care Provider:  Anthoney Harada, MD Primary Cardiologist:  D   Patient Profile  Re-establish care, former patient of Dr Percival Spanish, LE edema, SOB  Problem List   Past Medical History  Diagnosis Date  . Nodule of right lung     Right upper lobe  . Hyperlipidemia   . Hypertension   . Hypothyroidism   . Renal insufficiency     Chronic  . Arthritis    Past Surgical History  Procedure Laterality Date  . Thyroidectomy, partial      Allergies  Allergies  Allergen Reactions  . Clonidine Derivatives   . Sulfonamide Derivatives   . Terazosin     HPI  A very pleasant 80 year old female who was previously followed by Dr. Percival Spanish. She is coming for second opinion in regards to her blood pressure management as she is intolerant to multiple medications. She brings a list of medications with specific reasons why she stopped using them, lisinopril was associated with chronic cough, she feels that since she is taking amlodipine she has developed lower extremity edema and Terazosin gives her hives. She was using clonidine in the past but she felt exhausted.  The patient states that she noticed dyspnea on exertion, and she has developed lower extremity edema ever since amlodipine has been increased to 5 mg daily.  The patient states that she has been feeling welll and finally her BP is controlled. Her nephrologist increaseing Hydralazine from 25 TID to 50 PO TID that she didn't tolerate well and is currently taking 50 mg PO BID.  Her major problem right now are palpitations are have been occuring ever since she started to take hydralazine. They start all sudden and can last up to 3 hours, occuring approximately once a months. It is very uncomfortable for her but denies associated dizziness or SOB. It was documented in her PCP office as  a-fib with HR of 120 BPM.  Home Medications  Prior to Admission medications   Medication Sig Start Date End Date Taking? Authorizing Provider  amLODipine (NORVASC) 2.5 MG tablet TAKE 1 TABLET DAILY AS DIRECTED 05/20/13  Yes Vernie Shanks, MD  aspirin 81 MG tablet Take 81 mg by mouth daily.     Yes Historical Provider, MD  atenolol-chlorthalidone (TENORETIC) 50-25 MG per tablet Take 1 tablet by mouth daily. 01/13/13  Yes Vernie Shanks, MD  atorvastatin (LIPITOR) 20 MG tablet Take 0.5 tablets (10 mg total) by mouth daily. 05/10/13  Yes Vernie Shanks, MD  calcium-vitamin D (OSCAL WITH D) 500-200 MG-UNIT per tablet Take 1 tablet by mouth daily.     Yes Historical Provider, MD  levothyroxine (SYNTHROID, LEVOTHROID) 75 MCG tablet Take 1 tablet (75 mcg total) by mouth daily. 11/12/12  Yes Chipper Herb, MD  montelukast (SINGULAIR) 10 MG tablet Take 1 tablet (10 mg total) by mouth at bedtime. 03/02/13  Yes Vernie Shanks, MD  Omega-3 350 MG CAPS Take 1,000 mg by mouth 2 (two) times daily.    Yes Historical Provider, MD  terazosin (HYTRIN) 2 MG capsule Take 2 mg by mouth at bedtime.   Yes Historical Provider, MD    Family History  Family History  Problem Relation Age of Onset  . Thyroid disease Mother   . Stroke Father     Social History  History  Social History  . Marital Status: Married    Spouse Name: N/A    Number of Children: N/A  . Years of Education: N/A   Occupational History  . Not on file.   Social History Main Topics  . Smoking status: Never Smoker   . Smokeless tobacco: Not on file     Comment: Pt denies cigarettes  . Alcohol Use: No  . Drug Use: No  . Sexual Activity: Not on file   Other Topics Concern  . Not on file   Social History Narrative  . No narrative on file     Review of Systems, per history of present illness otherwise negative General:  No chills, fever, night sweats or weight changes.  Cardiovascular:  No chest pain, dyspnea on exertion, edema,  orthopnea, palpitations, paroxysmal nocturnal dyspnea. Dermatological: No rash, lesions/masses Respiratory: No cough, dyspnea Urologic: No hematuria, dysuria Abdominal:   No nausea, vomiting, diarrhea, bright red blood per rectum, melena, or hematemesis Neurologic:  No visual changes, wkns, changes in mental status. All other systems reviewed and are otherwise negative except as noted above.  Physical Exam  Blood pressure 158/68, pulse 52, height 5\' 6"  (1.676 m), weight 139 lb (63.05 kg).  General: Pleasant, NAD Psych: Normal affect. Neuro: Alert and oriented X 3. Moves all extremities spontaneously. HEENT: Normal  Neck: Supple without bruits or JVD. Lungs:  Resp regular and unlabored, CTA. Heart: RRR no s3, s4, or murmurs. Abdomen: Soft, non-tender, non-distended, BS + x 4.  Extremities: No clubbing, cyanosis, +2 nonpitting edema up to her knees. DP/PT/Radials 2+ and equal bilaterally.  Accessory Clinical Findings  ECG - sinus rhythm, 60 beats per minute, incomplete right bundle branch block otherwise normal EKG  Echocardiogram 06/16/2013  Left ventricle: The cavity size was normal. Wall thickness was normal. Systolic function was normal. The estimated ejection fraction was in the range of 60% to 65%. Wall motion was normal; there were no regional wall motion abnormalities. Left ventricular diastolic function parameters were normal. E' velocity is >10 cm/sec. - Mitral valve: Mildly thickened leaflets . Mild central regurgitation. - Left atrium: LA Volume/ BSA = 27.6 ml/m2 The atrium was at the upper limits of normal in size. - Tricuspid valve: Poorly visualized. Mild regurgitation. - Pulmonary arteries: RV systolic pressure: [id number] Hg (S). - Inferior vena cava: The vessel was normal in size; the respirophasic diameter changes were in the normal range (= 50%); findings are consistent with normal central venous pressure.    Assessment & Plan  A very pleasant 80 year old  female  1. Hypertension - patient is intolerant to multiple medications, the plan is to: D/C Terazosin (hives) D/C Amlodipine (edema) And  Start lasix 40 mg po daily  Start hydralazine 25 mg po TID  Since she has a diagnosis of atrial fibrillation with RVR that she contributes to hydralazine we will stop and start her Imdur 60 mg daily for blood pressure control.  2. new diagnoses of paroxysmal atrial fibrillation with RVR - in case her atrial fibrillation persists we will order a nuclear stress test to rule out ischemia in order to be able to start patient on flecainide 50 mg orally twice a day. The patient wants to hold on that for now.  3. Dyspnea on exertion, lower strandy edema - Order transthoracic echocardiogram to evaluate left and right ventricular function, intracardiac pressures and diastolic function  Follow up in 2 months, check BMP today  Dorothy Spark, MD  01/24/2014, 12:23 PM

## 2014-01-24 NOTE — Patient Instructions (Signed)
Your physician has recommended you make the following change in your medication:   STOP HYDRALAZINE NOW  START TAKING IMDUR 60 MG DAILY  Your physician recommends that you return for lab work in: [place] ([facility])  Your physician recommends that you schedule a follow-up appointment in: [place]

## 2014-01-25 ENCOUNTER — Ambulatory Visit: Payer: Medicare Other | Admitting: Cardiology

## 2014-01-25 DIAGNOSIS — I480 Paroxysmal atrial fibrillation: Secondary | ICD-10-CM | POA: Insufficient documentation

## 2014-02-06 ENCOUNTER — Emergency Department (HOSPITAL_COMMUNITY)
Admission: EM | Admit: 2014-02-06 | Discharge: 2014-02-06 | Disposition: A | Discharge status: Home / Self Care | Payer: Medicare Other | Attending: Emergency Medicine | Admitting: Emergency Medicine

## 2014-02-06 ENCOUNTER — Telehealth: Payer: Self-pay | Admitting: Cardiology

## 2014-02-06 ENCOUNTER — Encounter (HOSPITAL_COMMUNITY): Payer: Self-pay | Admitting: Emergency Medicine

## 2014-02-06 ENCOUNTER — Emergency Department (HOSPITAL_COMMUNITY): Payer: Medicare Other

## 2014-02-06 DIAGNOSIS — I10 Essential (primary) hypertension: Secondary | ICD-10-CM | POA: Insufficient documentation

## 2014-02-06 DIAGNOSIS — Z8709 Personal history of other diseases of the respiratory system: Secondary | ICD-10-CM | POA: Insufficient documentation

## 2014-02-06 DIAGNOSIS — R002 Palpitations: Secondary | ICD-10-CM | POA: Insufficient documentation

## 2014-02-06 DIAGNOSIS — M129 Arthropathy, unspecified: Secondary | ICD-10-CM | POA: Insufficient documentation

## 2014-02-06 DIAGNOSIS — E039 Hypothyroidism, unspecified: Secondary | ICD-10-CM | POA: Insufficient documentation

## 2014-02-06 DIAGNOSIS — I4891 Unspecified atrial fibrillation: Secondary | ICD-10-CM

## 2014-02-06 DIAGNOSIS — Z7982 Long term (current) use of aspirin: Secondary | ICD-10-CM | POA: Insufficient documentation

## 2014-02-06 DIAGNOSIS — Z79899 Other long term (current) drug therapy: Secondary | ICD-10-CM | POA: Insufficient documentation

## 2014-02-06 LAB — BASIC METABOLIC PANEL
BUN: 24 mg/dL — ABNORMAL HIGH (ref 6–23)
CALCIUM: 9.3 mg/dL (ref 8.4–10.5)
CO2: 29 mEq/L (ref 19–32)
Chloride: 99 mEq/L (ref 96–112)
Creatinine, Ser: 1.48 mg/dL — ABNORMAL HIGH (ref 0.50–1.10)
GFR calc Af Amer: 38 mL/min — ABNORMAL LOW (ref >90)
GFR calc non Af Amer: 33 mL/min — ABNORMAL LOW (ref >90)
GLUCOSE: 127 mg/dL — AB (ref 70–99)
POTASSIUM: 4.1 meq/L (ref 3.7–5.3)
Sodium: 140 mEq/L (ref 137–147)

## 2014-02-06 LAB — CBC
HEMATOCRIT: 35.4 % — AB (ref 36.0–46.0)
HEMOGLOBIN: 11.8 g/dL — AB (ref 12.0–15.0)
MCH: 28.2 pg (ref 26.0–34.0)
MCHC: 33.3 g/dL (ref 30.0–36.0)
MCV: 84.5 fL (ref 78.0–100.0)
Platelets: 140 10*3/uL — ABNORMAL LOW (ref 150–400)
RBC: 4.19 MIL/uL (ref 3.87–5.11)
RDW: 13.5 % (ref 11.5–15.5)
WBC: 3.2 10*3/uL — ABNORMAL LOW (ref 4.0–10.5)

## 2014-02-06 LAB — I-STAT TROPONIN, ED: Troponin i, poc: 0.01 ng/mL (ref 0.00–0.08)

## 2014-02-06 NOTE — ED Notes (Signed)
Patient discharged and discharge instructions reviewed.

## 2014-02-06 NOTE — ED Notes (Signed)
Patient states she has been having palpitations today starting about 10pm.  States she thought it may be from the new medicine she was started on June 9th. (Imdur).  Called her cardiologist and was told to come to the ED.

## 2014-02-06 NOTE — ED Provider Notes (Signed)
CSN: [id number]     Arrival date & time 02/06/14  1522 History   First MD Initiated Contact with Patient 02/06/14 2029     Chief Complaint  Patient presents with  . Palpitations     (Consider location/radiation/quality/duration/timing/severity/associated sxs/prior Treatment) HPI Comments: Patient presents with palpitations that came on at rest. She documented her heart beat in the 130 to 150 range. She was diagnosed with intermittent atrial fibrillation about 2 weeks ago. He denies any chest pain or shortness of breath. She now feels back to baseline heart rate is sinus in the 70s and 80s. She denies any dizziness or lightheadedness. No focal weakness, numbness or tingling. No bowel or bladder incontinence. No fever or vomiting.  Patient recently saw her Dr. in her hydralazine was stopped and she was started on Imdur and Lasix. She states compliance with his medications. She denies any palpitations currently. No headache or fever or vision change.  The history is provided by the patient.    Past Medical History  Diagnosis Date  . Nodule of right lung     Right upper lobe  . Hyperlipidemia   . Hypertension   . Hypothyroidism   . Renal insufficiency     Chronic  . Arthritis    Past Surgical History  Procedure Laterality Date  . Thyroidectomy, partial     Family History  Problem Relation Age of Onset  . Thyroid disease Mother   . Stroke Father    History  Substance Use Topics  . Smoking status: Never Smoker   . Smokeless tobacco: Not on file     Comment: Pt denies cigarettes  . Alcohol Use: No   OB History   Grav Para Term Preterm Abortions TAB SAB Ect Mult Living                 Review of Systems  Constitutional: Negative for fever, activity change and appetite change.  Respiratory: Negative for cough, chest tightness and shortness of breath.   Cardiovascular: Positive for palpitations. Negative for chest pain.  Gastrointestinal: Negative for nausea, vomiting and  abdominal pain.  Genitourinary: Negative for dysuria, hematuria, vaginal bleeding and vaginal discharge.  Musculoskeletal: Negative for arthralgias, back pain and myalgias.  Skin: Negative for wound.  Neurological: Negative for dizziness, weakness and headaches.  A complete 10 system review of systems was obtained and all systems are negative except as noted in the HPI and PMH.      Allergies  Clonidine derivatives; Terazosin; and Sulfonamide derivatives  Home Medications   Prior to Admission medications   Medication Sig Start Date End Date Taking? Authorizing Provider  aspirin EC 81 MG tablet Take 81 mg by mouth at bedtime.   Yes Historical Provider, MD  atenolol-chlorthalidone (TENORETIC) 50-25 MG per tablet Take 1 tablet by mouth daily. 07/19/13  Yes Vernie Shanks, MD  atorvastatin (LIPITOR) 20 MG tablet Take 0.5 tablets (10 mg total) by mouth daily. 01/10/14  Yes Chipper Herb, MD  calcium-vitamin D (OSCAL WITH D) 500-200 MG-UNIT per tablet Take 1 tablet by mouth daily.     Yes Historical Provider, MD  furosemide (LASIX) 20 MG tablet Take 1 tablet (20 mg total) by mouth daily. 10/20/13  Yes Dorothy Spark, MD  isosorbide mononitrate (IMDUR) 60 MG 24 hr tablet Take 1 tablet (60 mg total) by mouth daily. 01/24/14  Yes Dorothy Spark, MD  levothyroxine (SYNTHROID, LEVOTHROID) 75 MCG tablet Take 1 tablet (75 mcg total) by mouth daily. 07/19/13  Yes Vernie Shanks, MD  omega-3 acid ethyl esters (LOVAZA) 1 G capsule Take 1 g by mouth daily.   Yes Historical Provider, MD  potassium chloride (K-DUR) 10 MEQ tablet Take 1 tablet (10 mEq total) by mouth daily. 06/30/13  Yes Dorothy Spark, MD   BP 160/56  Pulse 56  Temp(Src) 98.2 F (36.8 C) (Oral)  Resp 20  SpO2 96% Physical Exam  Nursing note and vitals reviewed. Constitutional: She is oriented to person, place, and time. She appears well-developed and well-nourished. No distress.  HENT:  Head: Normocephalic and atraumatic.   Mouth/Throat: Oropharynx is clear and moist. No oropharyngeal exudate.  Eyes: Conjunctivae and EOM are normal. Pupils are equal, round, and reactive to light.  Neck: Normal range of motion. Neck supple.  No meningismus.  Cardiovascular: Normal rate, regular rhythm, normal heart sounds and intact distal pulses.   No murmur heard. Pulmonary/Chest: Effort normal and breath sounds normal. No respiratory distress.  Abdominal: Soft. There is no tenderness. There is no rebound and no guarding.  Musculoskeletal: Normal range of motion. She exhibits no edema and no tenderness.  Neurological: She is alert and oriented to person, place, and time. No cranial nerve deficit. She exhibits normal muscle tone. Coordination normal.  No ataxia on finger to nose bilaterally. No pronator drift. 5/5 strength throughout. CN 2-12 intact. Negative Romberg. Equal grip strength. Sensation intact. Gait is normal.   Skin: Skin is warm.  Psychiatric: She has a normal mood and affect. Her behavior is normal.    ED Course  Procedures (including critical care time) Labs Review Labs Reviewed  CBC - Abnormal; Notable for the following:    WBC 3.2 (*)    Hemoglobin 11.8 (*)    HCT 35.4 (*)    Platelets 140 (*)    All other components within normal limits  BASIC METABOLIC PANEL - Abnormal; Notable for the following:    Glucose, Bld 127 (*)    BUN 24 (*)    Creatinine, Ser 1.48 (*)    GFR calc non Af Amer 33 (*)    GFR calc Af Amer 38 (*)    All other components within normal limits  Randolm Idol, ED    Imaging Review Dg Chest 2 View  02/06/2014   CLINICAL DATA:  Tachycardia.  EXAM: CHEST  2 VIEW  COMPARISON:  06/10/2013.  CT scan from 09/27/2008.  The.  FINDINGS: The lungs are clear without focal infiltrate, edema, pneumothorax or pleural effusion. Small right infrahilar lucency seen to represent of paraseptal air cyst on the previous CT scan. Cardiopericardial silhouette is at upper limits of normal for size.  Imaged bony structures of the thorax are intact.  IMPRESSION: Stable.  No acute findings.   Electronically Signed   By: Misty Stanley M.D.   On: 02/06/2014 21:43     EKG Interpretation   Date/Time:  Monday February 06 2014 15:35:56 EDT Ventricular Rate:  69 PR Interval:  184 QRS Duration: 90 QT Interval:  398 QTC Calculation: 426 R Axis:   85 Text Interpretation:  Normal sinus rhythm RSR' or QR pattern in V1  suggests right ventricular conduction delay Borderline ECG No significant  change was found Confirmed by Wyvonnia Dusky  MD, Oriyah Lamphear [id number]) on 02/06/2014  8:48:59 PM      MDM   Final diagnoses:  Palpitations   Palpitations for 6 hours that are now resolved. sinus rhythm on arrival no chest pain or shortness of breath.  Is at baseline. Troponin  is negative.  Patient's blood pressure was elevated in the ED the [id number] range systolic. She has a log of her blood pressure home is well controlled in the 120-140 range. She has no chest pain or shortness of breath.  Spoke with cardiology fellow Dr. Inda Castle who evaluated the patient. He feels he is stable for discharge without any changes to her medications as her blood pressures at home have been stable. She has paroxysmal atrial fibrillation, and anticoagulation is not yet been started by her cardiologist. He discussed this with her and deferred to her primary cardiologist.     Ezequiel Essex, MD 02/07/14 [phone]

## 2014-02-06 NOTE — ED Notes (Signed)
Pt had blood pressure med changed in November and since has been having periods where her heart rate goes up and stays up.  They changed her bp med in may and then she had it happen again today.  Pt denies sob or chest pain with the increase in heart rate.  Referred here to follow up since her MD is going out of town.

## 2014-02-06 NOTE — H&P (Signed)
Cardiology Consultation Note  Patient ID: [name], MRN: [id number], DOB/AGE: [birth year] 80 y.o. Admit date: 02/06/2014   Date of Consult: 02/06/2014 Primary Physician: Redge Gainer, MD Primary Cardiologist: Dr Meda Coffee   Chief Complaint: palpitations  Reason for Consult: Afib    Assessment and Plan:  HTN uncontrolled. Unclear why the home BP are so well controlled and her BP here is elevated ? Stress related . Pt does report  taking her BP meds earlier and has been sensitive to BP changes in the past. Instructed her to followup with her cardiologist with this and continue to monitor at home.    PAF- explained to her the risk of CVA with Afib and options for definitive treatment for PAF. Pt will follow up with her cardiologist with Dr Meda Coffee for this . She has stress test pending to evaluate for CAD before starting prn Flecainide    HPI: 80 yr old female with hx of HTN , PAF , chronic DOE being followed by Dr Percival Spanish and Meda Coffee here with palpitations   Pt states that around 10 am this morning  She felt the onset of palpitations typical of her Afib. This lasted for about 3-4 hrs and dissipated by the time she reaches the ER. An EKG here showed her to be in NSR with incomplete RBBB. BP was elevated in 180-200 SBP ranges. Pt denies any Pt denies any chest pain , SOB , orthopnea, PND , LE edema , Syncope ,claudcation , focal weakness, or bleeding diathesis .  Reports medication compliance. States her hydralazine was recently d/c due to her concern of this medication causing Afib and she was started on  Imdur. She has a BP log with her with home BP in the 140-120 SBP range.    Past Medical History  Diagnosis Date  . Nodule of right lung     Right upper lobe  . Hyperlipidemia   . Hypertension   . Hypothyroidism   . Renal insufficiency     Chronic  . Arthritis       Most Recent Cardiac Studies: Echo 05/2013 Left ventricle: The cavity size was normal. Wall thickness was normal.  Systolic function was normal. The estimated ejection fraction was in the range of 60% to 65%. Wall motion was normal; there were no regional wall motion abnormalities. Left ventricular diastolic function parameters were normal. E' velocity is >10 cm/sec. - Mitral valve: Mildly thickened leaflets . Mild central regurgitation. - Left atrium: LA Volume/ BSA = 27.6 ml/m2 The atrium was at the upper limits of normal in size. - Tricuspid valve: Poorly visualized. Mild regurgitation. - Pulmonary arteries: RV systolic pressure: [id number] Hg (S). - Inferior vena cava: The vessel was normal in size; the respirophasic diameter changes were in the normal range (= 50%); findings are consistent with normal central venous pressure.    Surgical History:  Past Surgical History  Procedure Laterality Date  . Thyroidectomy, partial       Home Meds: Prior to Admission medications   Medication Sig Start Date End Date Taking? Authorizing Provider  aspirin EC 81 MG tablet Take 81 mg by mouth at bedtime.   Yes Historical Provider, MD  atenolol-chlorthalidone (TENORETIC) 50-25 MG per tablet Take 1 tablet by mouth daily. 07/19/13  Yes Vernie Shanks, MD  atorvastatin (LIPITOR) 20 MG tablet Take 0.5 tablets (10 mg total) by mouth daily. 01/10/14  Yes Chipper Herb, MD  calcium-vitamin D (OSCAL WITH D) 500-200 MG-UNIT per tablet Take 1 tablet by  mouth daily.     Yes Historical Provider, MD  furosemide (LASIX) 20 MG tablet Take 1 tablet (20 mg total) by mouth daily. 10/20/13  Yes Dorothy Spark, MD  isosorbide mononitrate (IMDUR) 60 MG 24 hr tablet Take 1 tablet (60 mg total) by mouth daily. 01/24/14  Yes Dorothy Spark, MD  levothyroxine (SYNTHROID, LEVOTHROID) 75 MCG tablet Take 1 tablet (75 mcg total) by mouth daily. 07/19/13  Yes Vernie Shanks, MD  omega-3 acid ethyl esters (LOVAZA) 1 G capsule Take 1 g by mouth daily.   Yes Historical Provider, MD  potassium chloride (K-DUR) 10 MEQ tablet Take 1 tablet (10 mEq  total) by mouth daily. 06/30/13  Yes Dorothy Spark, MD    Inpatient Medications:     Allergies:  Allergies  Allergen Reactions  . Clonidine Derivatives Nausea Only  . Terazosin Hives and Nausea Only  . Sulfonamide Derivatives Nausea Only and Rash    History   Social History  . Marital Status: Married    Spouse Name: N/A    Number of Children: N/A  . Years of Education: N/A   Occupational History  . Not on file.   Social History Main Topics  . Smoking status: Never Smoker   . Smokeless tobacco: Not on file     Comment: Pt denies cigarettes  . Alcohol Use: No  . Drug Use: No  . Sexual Activity: Not on file   Other Topics Concern  . Not on file   Social History Narrative  . No narrative on file     Family History  Problem Relation Age of Onset  . Thyroid disease Mother   . Stroke Father      Review of Systems:  General: negative for chills, fever, night sweats or weight changes.  Cardiovascular:see HPI  Dermatological: negative for rash Respiratory: negative for cough or wheezing Urologic: negative for hematuria Abdominal: negative for nausea, vomiting, diarrhea, bright red blood per rectum, melena, or hematemesis Neurologic: negative for visual changes, syncope, or dizziness All other systems reviewed and are otherwise negative except as noted above.  Labs: No results found for this basename: CKTOTAL, CKMB, TROPONINI,  in the last 72 hours Lab Results  Component Value Date   WBC 3.2* 02/06/2014   HGB 11.8* 02/06/2014   HCT 35.4* 02/06/2014   MCV 84.5 02/06/2014   PLT 140* 02/06/2014    Recent Labs Lab 02/06/14 1600  NA 140  K 4.1  CL 99  CO2 29  BUN 24*  CREATININE 1.48*  CALCIUM 9.3  GLUCOSE 127*   Lab Results  Component Value Date   CHOL 146 12/06/2013   HDL 45 12/06/2013   LDLCALC Comment 12/06/2013   TRIG 158* 12/06/2013   Lab Results  Component Value Date   DDIMER  Value: 0.37        AT THE INHOUSE ESTABLISHED CUTOFF VALUE OF 0.48  ug/mL FEU, THIS ASSAY HAS BEEN DOCUMENTED IN THE LITERATURE TO HAVE 06/04/2007    Radiology/Studies:  Dg Chest 2 View  02/06/2014   CLINICAL DATA:  Tachycardia.  EXAM: CHEST  2 VIEW  COMPARISON:  06/10/2013.  CT scan from 09/27/2008.  The.  FINDINGS: The lungs are clear without focal infiltrate, edema, pneumothorax or pleural effusion. Small right infrahilar lucency seen to represent of paraseptal air cyst on the previous CT scan. Cardiopericardial silhouette is at upper limits of normal for size. Imaged bony structures of the thorax are intact.  IMPRESSION: Stable.  No acute  findings.   Electronically Signed   By: Misty Stanley M.D.   On: 02/06/2014 21:43    EKG: see above   Physical Exam: Blood pressure 191/76, pulse 63, temperature 98.2 F (36.8 C), temperature source Oral, resp. rate 18, SpO2 98.00%. General: Well developed, well nourished, in no acute distress.  Neck: Negative for carotid bruits. JVD not elevated. Lungs: Clear bilaterally to auscultation without wheezes, rales, or rhonchi. Breathing is unlabored. Heart: RRR with S1 S2. No murmurs, rubs, or gallops appreciated. Abdomen: Soft, non-tender, non-distended with normoactive bowel sounds. No hepatomegaly. No rebound/guarding. No obvious abdominal masses. Extremities: No clubbing or cyanosis. No edema.  Distal pedal pulses are 2+ and equal bilaterally. Neuro: Alert and oriented X 3. No facial asymmetry. No focal deficit. Moves all extremities spontaneously. Psych:  Responds to questions appropriately with a normal affect.    Cory Roughen, A M.D  02/06/2014, 10:29 PM

## 2014-02-06 NOTE — Telephone Encounter (Signed)
Pt contacting to speak with Dr Meda Coffee to let her know that she is in rapid AFIB at a rate of 130-150 and BP at 158/100.  Pt states she does not have any cp, but does feel as if her heart is fluttering.  Pt states she is sob. Pt states she just "doesn't feel right."  Pt last seen at office on 6/9 with new diagnoses of paroxysmal atrial fibrillation with RVR -per Dr Meda Coffee, in case her atrial fibrillation persists we will order a nuclear stress test to rule out ischemia in order to be able to start patient on flecainide 50 mg orally twice a day. The patient wants to hold on that for now.  Discussed this with pt, and she still wants to hold off on this therapy.  Informed pt that Dr Meda Coffee is out of the office until next week.  Advised pt, given her newly dx of afib with RVR, she should consider going to the ED at [facility] for further cardiac work-up.  Pt agreed this would probably be the safest thing to do, given Dr Meda Coffee is out of the office.  Pt states "I'm not calling EMS, I can get my Husband to take me." Informed pt that I will notify our card master [place] at [facility] and make her aware of pt coming to the ED via private vehicle.  Pt verbalized understanding and agrees with this plan.  Will send this message to Dr Meda Coffee for her review as well.  Trish notified at 2:32 pm.

## 2014-02-06 NOTE — Discharge Instructions (Signed)
Palpitations Follow up with your doctor this week. Take your medications as prescribed. Return to the ED if you develop chest pain, shortness of breath or any other concerns. A palpitation is the feeling that your heartbeat is irregular or is faster than normal. It may feel like your heart is fluttering or skipping a beat. Palpitations are usually not a serious problem. However, in some cases, you may need further medical evaluation. CAUSES  Palpitations can be caused by:  Smoking.  Caffeine or other stimulants, such as diet pills or energy drinks.  Alcohol.  Stress and anxiety.  Strenuous physical activity.  Fatigue.  Certain medicines.  Heart disease, especially if you have a history of irregular heart rhythms (arrhythmias), such as atrial fibrillation, atrial flutter, or supraventricular tachycardia.  An improperly working pacemaker or defibrillator. DIAGNOSIS  To find the cause of your palpitations, your health care provider will take your medical history and perform a physical exam. Your health care provider may also have you take a test called an ambulatory electrocardiogram (ECG). An ECG records your heartbeat patterns over a 24-hour period. You may also have other tests, such as:  Transthoracic echocardiogram (TTE). During echocardiography, sound waves are used to evaluate how blood flows through your heart.  Transesophageal echocardiogram (TEE).  Cardiac monitoring. This allows your health care provider to monitor your heart rate and rhythm in real time.  Holter monitor. This is a portable device that records your heartbeat and can help diagnose heart arrhythmias. It allows your health care provider to track your heart activity for several days, if needed.  Stress tests by exercise or by giving medicine that makes the heart beat faster. TREATMENT  Treatment of palpitations depends on the cause of your symptoms and can vary greatly. Most cases of palpitations do not  require any treatment other than time, relaxation, and monitoring your symptoms. Other causes, such as atrial fibrillation, atrial flutter, or supraventricular tachycardia, usually require further treatment. HOME CARE INSTRUCTIONS   Avoid:  Caffeinated coffee, tea, soft drinks, diet pills, and energy drinks.  Chocolate.  Alcohol.  Stop smoking if you smoke.  Reduce your stress and anxiety. Things that can help you relax include:  A method of controlling things in your body, such as your heartbeats, with your mind (biofeedback).  Yoga.  Meditation.  Physical activity such as swimming, jogging, or walking.  Get plenty of rest and sleep. SEEK MEDICAL CARE IF:   You continue to have a fast or irregular heartbeat beyond 24 hours.  Your palpitations occur more often. SEEK IMMEDIATE MEDICAL CARE IF:  You have chest pain or shortness of breath.  You have a severe headache.  You feel dizzy or you faint. MAKE SURE YOU:  Understand these instructions.  Will watch your condition.  Will get help right away if you are not doing well or get worse. Document Released: 08/01/2000 Document Revised: 08/09/2013 Document Reviewed: 10/03/2011 [facility] Patient Information 2015 [place], [place]. This information is not intended to replace advice given to you by your health care provider. Make sure you discuss any questions you have with your health care provider.

## 2014-02-06 NOTE — Telephone Encounter (Signed)
New message    Patient calling c/o heart rate & blood pressure 145/101 , 131. Taken about  10 min home machine.     No dizzy,  No chest pain & no sob .     Took am meds

## 2014-02-07 ENCOUNTER — Telehealth: Payer: Self-pay | Admitting: Cardiology

## 2014-02-07 NOTE — Telephone Encounter (Signed)
New message     Pt was seen in the ER yesterday for rapid heart beat---she want to talk to a nurse

## 2014-02-07 NOTE — Telephone Encounter (Signed)
Follow up      Patient call on yesterday .   Advise to go to emergency room.   Please call to discuss.

## 2014-02-07 NOTE — Telephone Encounter (Signed)
Called pt to check on her today and see what Dr from the ED advised yesterday.  Pt states that the ED MD recommended her f/u with Dr Meda Coffee and proceed with having the recommended test per Dr Francesca Oman OV on 6/9.  Pt states she does feel better today, and she's glad she went to get checked out yesterday.  Pt has no symptoms today.  Went over discharge paperwork with pt from the ED on 6/22.  Informed pt that I will further update Dr Meda Coffee through sending her a message.  Pt is aware that Dr Meda Coffee will be out of the office until next Monday.  Informed pt that if she becomes symptomatic with CP or SOB, feels dizzy or faint, then she needs to go to the ED and inform our office.  Told pt that I will let Dr Meda Coffee know she wants to proceed with the nuclear stress test and other medication recommendations from 6/9 OV.  Pt verbalized understanding and agrees with this plan.  Pt very grateful for all the assistance provided.

## 2014-02-08 ENCOUNTER — Telehealth: Payer: Self-pay | Admitting: Cardiology

## 2014-02-08 NOTE — Telephone Encounter (Signed)
Pt calling stating she would like to see Dr Meda Coffee sometime before the week is over.   Spoke with pt on 6/22 when she called with complaints that she was back in afib.  Given pts symptoms on Monday, advised her to go to the ED, because Dr Meda Coffee is out of the office for the week and no availabilities noted on other providers schedules.  Pt was discharged from the ED with palpitations.  Went over discharge instructions with pt over the phone on 6/23 and pt verbalized understanding and had no complaints.  Advised pt on 6/23 phone call, that Dr Meda Coffee will be back on Monday, and we will follow-up from there after she reviews her chart and gives her recommendations.  Pt verbalized agreement with this plan.  Pt calling today, because per her Daughters, she should be seen by a Cardiologist this week.  Asked pt if she is experiencing any symptoms at this current time.  Pt states she has feelings of fluttering in her chest every once in awhile, but denies any cp and sob.  Pt also denies any DOE, dizziness, feeling faint, LE edema, or being in any acute distress.  Informed pt that one of our schedulers will be calling her in the next few minutes to schedule an appt with one of our providers this week, given Dr Meda Coffee is out of the office.  Pt verbalized understanding and agrees with this plan.  Scheduler informed me that pt will be seen on 6/25 by Dr Caryl Comes DOD at 2:30pm. Will route this message to Dr Meda Coffee for her review.

## 2014-02-08 NOTE — Telephone Encounter (Signed)
New message ° ° ° ° ° ° ° ° ° °Pt returning nurses call °

## 2014-02-09 ENCOUNTER — Ambulatory Visit (INDEPENDENT_AMBULATORY_CARE_PROVIDER_SITE_OTHER): Payer: Medicare Other | Admitting: Internal Medicine

## 2014-02-09 ENCOUNTER — Encounter: Payer: Self-pay | Admitting: Internal Medicine

## 2014-02-09 VITALS — BP 157/75 | HR 65 | Ht 66.0 in | Wt 139.0 lb

## 2014-02-09 DIAGNOSIS — I471 Supraventricular tachycardia: Secondary | ICD-10-CM

## 2014-02-09 DIAGNOSIS — I498 Other specified cardiac arrhythmias: Secondary | ICD-10-CM

## 2014-02-09 MED ORDER — FLECAINIDE ACETATE 50 MG PO TABS: 50.0000 mg | ORAL_TABLET | Freq: Two times a day (BID) | ORAL | Status: DC

## 2014-02-09 NOTE — Progress Notes (Signed)
Patient Care Team: Chipper Herb, MD as PCP - General (Family Medicine)   HPI  [name] is a 80 y.o. female Seen as an add-on because of atrial fibrillation. (See below) She is seeing Dr. Lenore Cordia. More recently she has seen Dr. Lesly Dukes. She anticipates stress testing prior to beginning antiarrhythmic therapy   She tells a story again of her antihypertensives and the concern that hydralazine is associated with her tachypalpitations. These episodes occurred about every 4 weeks. They're quite discombobulating   When she was seen by Dr. Meda Coffee in early June, she was noted to have edema. This resolved rapidly with the introduction of furosemide    She also has a problem with high blood pressure which has been difficult to control.    Past Medical History  Diagnosis Date  . Nodule of right lung     Right upper lobe  . Hyperlipidemia   . Hypertension   . Hypothyroidism   . Renal insufficiency     Chronic  . Arthritis     Past Surgical History  Procedure Laterality Date  . Thyroidectomy, partial      Current Outpatient Prescriptions  Medication Sig Dispense Refill  . aspirin EC 81 MG tablet Take 81 mg by mouth at bedtime.      Marland Kitchen atenolol-chlorthalidone (TENORETIC) 50-25 MG per tablet Take 1 tablet by mouth daily.  90 tablet  3  . atorvastatin (LIPITOR) 20 MG tablet Take 0.5 tablets (10 mg total) by mouth daily.  30 tablet  4  . calcium-vitamin D (OSCAL WITH D) 500-200 MG-UNIT per tablet Take 1 tablet by mouth daily.        . furosemide (LASIX) 20 MG tablet Take 1 tablet (20 mg total) by mouth daily.  30 tablet  3  . isosorbide mononitrate (IMDUR) 60 MG 24 hr tablet Take 1 tablet (60 mg total) by mouth daily.  90 tablet  3  . levothyroxine (SYNTHROID, LEVOTHROID) 75 MCG tablet Take 1 tablet (75 mcg total) by mouth daily.  90 tablet  3  . omega-3 acid ethyl esters (LOVAZA) 1 G capsule Take 1 g by mouth daily.      . potassium chloride (K-DUR) 10 MEQ tablet Take 1 tablet (10  mEq total) by mouth daily.  90 tablet  3   No current facility-administered medications for this visit.    Allergies  Allergen Reactions  . Clonidine Derivatives Nausea Only  . Terazosin Hives and Nausea Only  . Sulfonamide Derivatives Nausea Only and Rash    Review of Systems negative except from HPI and PMH  Physical Exam BP 157/75  Pulse 65  Ht 5\' 6"  (1.676 m)  Wt 139 lb (63.05 kg)  BMI 22.45 kg/m2 Well developed and well nourished in no acute distress HENT normal E scleral and icterus clear Neck Supple JVP flat; carotids brisk and full Clear to ausculation bRegular rate and rhythm, no murmurs gallops or rub Soft with active bowel sounds No clubbing cyanosis  Edema Alert and oriented, grossly normal motor and sensory function Skin Warm and Dry  ECG 6/22 demonstrated sinus rhythm at 69 intervals 18/09/40 Axis is 85  ECG from 5/28 demonstrated the mechanism of her tachycardia. She has a left atrial tachycardia identified by P-wave inversion in lead V1.   Tracing 16:10:47 demonstrates one-to-one conduction tracing 16:11:33 and a straight intermittent atrial tachycardia  Assessment and  Plan  Atrial tachycardia  Sinus bradycardia  Hypertension  HFpEF  There is  no evidence of atrial fibrillation. Tracings are clear and demonstrating a left atrial tachycardia as the mechanism. Hence, I have asked her to discontinue her aspirin given her superficial ecchymoses.  We will decrease her Lasix to take as needed as her edema has rapidly resolved.  Bradycardia limits up titration of her beta blockers. Hence, I agree with Dr. Lesly Dukes, antiarrhythmic therapy is indicated. I also agree with the choice of flecainide. We will begin flecainide 40s prior to anticipated stress Myoview scanning.  Is not clearly why she is on isosorbide. I will defer its continuation to Dr. Lesly Dukes.

## 2014-02-09 NOTE — Patient Instructions (Addendum)
Your physician recommends that you schedule a follow-up appointment as scheduled and Dr Caryl Comes as needed  Your physician has requested that you have a lexiscan myoview. For further information please visit [email or web address]. Please follow instruction sheet, as given.  Your physician has recommended you make the following change in your medication:  1) Start Flecainide 50mg  twice daily--start this 4 days prior to stress test 2) Stop Asprin  3) Stop Furosemide 4) Stop Potassium

## 2014-02-13 ENCOUNTER — Encounter: Payer: Self-pay | Admitting: Cardiology

## 2014-02-14 NOTE — Telephone Encounter (Signed)
F/u ° ° °Pt returning your call °

## 2014-02-14 NOTE — Telephone Encounter (Signed)
Returned call to patient she stated she was returning call to [place].Stated Brantley Fling that was scheduled 02/15/14 was cancelled due to not being approved by insurance.Stated she started taking flecainide as instructed 4 days before myoview.Stated she wanted to know should she continue.Advised to continue, will send message to [place].

## 2014-02-15 ENCOUNTER — Telehealth: Payer: Self-pay | Admitting: *Deleted

## 2014-02-15 ENCOUNTER — Encounter (HOSPITAL_COMMUNITY): Payer: Medicare Other

## 2014-02-15 NOTE — Telephone Encounter (Signed)
Contacted pt per Dr Meda Coffee to inform her that her recently cancelled [place] has been appealed by Dr Meda Coffee, and we will know by tomorrow if it gets approved or not.  Told pt per Dr Meda Coffee she should continue taking her recently prescribed Flecanide.  Pt verbalized understanding and agrees with this plan.

## 2014-02-15 NOTE — Telephone Encounter (Signed)
Please call her and let her know that we appealed stress test denial and we will know by tomorrow if it gets approved. For now she should continue taking flecanide.

## 2014-02-15 NOTE — Telephone Encounter (Signed)
Pt notified about approved appeal for lexiscan.  Informed pt that someone from our scheduling dept will be contacting her to set this up. Pt verbalized understanding and pleased with this news.

## 2014-02-15 NOTE — Telephone Encounter (Signed)
Message copied by Nuala Alpha on Wed Feb 15, 2014  4:57 PM ------      Message from: Dorothy Spark      Created: Wed Feb 15, 2014  4:09 PM      Regarding: FW: Approved Myoview       Please let her know and schedule it, than you,K      ----- Message -----         From: Lillia Pauls         Sent: 02/15/2014   4:04 PM           To: Dorothy Spark, MD, Benancio Deeds      Subject: Approved Myoview                                         Dr. Meda Coffee            Pt's myoview was finally approved.  I will have pt rescheduled.            Charmaine       ------

## 2014-02-15 NOTE — Telephone Encounter (Signed)
Pt is not a Caryl Comes pt - he saw her as an add on one day post hosp A fib. Have discussed this with Winifred Olive, LPN (Dr Francesca Oman nurse) to review with Dr. Meda Coffee for further advisement and forwarding to her.

## 2014-02-23 ENCOUNTER — Ambulatory Visit (HOSPITAL_COMMUNITY): Payer: Medicare Other | Attending: Cardiology | Admitting: Radiology

## 2014-02-23 VITALS — BP 190/107 | Ht 66.0 in | Wt 136.0 lb

## 2014-02-23 DIAGNOSIS — I4949 Other premature depolarization: Secondary | ICD-10-CM

## 2014-02-23 DIAGNOSIS — R002 Palpitations: Secondary | ICD-10-CM | POA: Insufficient documentation

## 2014-02-23 DIAGNOSIS — I498 Other specified cardiac arrhythmias: Secondary | ICD-10-CM

## 2014-02-23 DIAGNOSIS — I10 Essential (primary) hypertension: Secondary | ICD-10-CM | POA: Insufficient documentation

## 2014-02-23 DIAGNOSIS — I471 Supraventricular tachycardia: Secondary | ICD-10-CM

## 2014-02-23 DIAGNOSIS — I4891 Unspecified atrial fibrillation: Secondary | ICD-10-CM | POA: Insufficient documentation

## 2014-02-23 MED ORDER — TECHNETIUM TC 99M SESTAMIBI GENERIC - CARDIOLITE
30.0000 | Freq: Once | INTRAVENOUS | Status: AC | PRN
  Administered 2014-02-23: 30 via INTRAVENOUS

## 2014-02-23 MED ORDER — REGADENOSON 0.4 MG/5ML IV SOLN
0.4000 mg | Freq: Once | INTRAVENOUS | Status: AC
  Administered 2014-02-23: 0.4 mg via INTRAVENOUS

## 2014-02-23 MED ORDER — TECHNETIUM TC 99M SESTAMIBI GENERIC - CARDIOLITE
10.0000 | Freq: Once | INTRAVENOUS | Status: AC | PRN
  Administered 2014-02-23: 10 via INTRAVENOUS

## 2014-02-23 NOTE — Progress Notes (Signed)
[facility] [address] [place], [place] [address] [phone]    Cardiology Nuclear Med Study  [name] is a 80 y.o. female     MRN : [id number]     DOB: Feb 29, 1934  Procedure Date: 02/23/2014  Nuclear Med Background Indication for Stress Test:  Evaluation for Ischemia, [facility] and Palpitations/Afib Recently started on Flecainide History:No Known History of CAD;Cath;Afib hx;Previous Nuclear Study 07', Nml 75% Cardiac Risk Factors: Hypertension and Lipids  Symptoms:  Palpitations   Nuclear Pre-Procedure Caffeine/Decaff Intake:  None NPO After: 7:00pm   Lungs:  clear O2 Sat: 95% on room air. IV 0.9% NS with Angio Cath:  22g  IV Site: L Antecubital  IV Started by:  Perrin Maltese, EMT-P  Chest Size (in):  38 Cup Size: B  Height: 5\' 6"  (1.676 m)  Weight:  136 lb (61.689 kg)  BMI:  Body mass index is 21.96 kg/(m^2). Tech Comments:  Rx this am    Nuclear Med Study 1 or 2 day study: 1 day  Stress Test Type:  Carlton Adam  Reading MD: n/a  Order Authorizing Provider:  S.Klein MD/K.Nelson MD  Resting Radionuclide: Technetium 72m Sestamibi  Resting Radionuclide Dose: 11.0 mCi   Stress Radionuclide:  Technetium 68m Sestamibi  Stress Radionuclide Dose: 33.0 mCi           Stress Protocol Rest HR: 55 Stress HR: 71  Rest BP: 190/107 Stress BP: 159/65  Exercise Time (min): n/a METS: n/a   Predicted Max HR: 142 bpm % Max HR: 50 bpm Rate Pressure Product: 13490   Dose of Adenosine (mg):  n/a Dose of Lexiscan: 0.4 mg  Dose of Atropine (mg): n/a Dose of Dobutamine: n/a mcg/kg/min (at max HR)  Stress Test Technologist: Ileene Hutchinson, EMT-P  Nuclear Technologist:  Vedia Pereyra, CNMT     Rest Procedure:  Myocardial perfusion imaging was performed at rest 45 minutes following the intravenous administration of Technetium 50m Sestamibi. Rest ECG: NSR with frequent PACs  Stress Procedure:  The patient received IV Lexiscan 0.4 mg over  15-seconds.  Technetium 48m Sestamibi injected at 30-seconds.  Quantitative spect images were obtained after a 45 minute delay. Stress ECG: No significant change from baseline ECG  QPS Raw Data Images:  Normal; no motion artifact; normal heart/lung ratio. Stress Images:  Normal homogeneous uptake in all areas of the myocardium. Rest Images:  Normal homogeneous uptake in all areas of the myocardium. Subtraction (SDS):  No evidence of ischemia. Transient Ischemic Dilatation (Normal <1.22):  1.13 Lung/Heart Ratio (Normal <0.45):  0.28  Quantitative Gated Spect Images QGS EDV:  86 ml QGS ESV:  27 ml  Impression Exercise Capacity:  Lexiscan with no exercise. BP Response:  Normal blood pressure response. Clinical Symptoms:  No significant symptoms noted. ECG Impression:  No significant ST segment change suggestive of ischemia. Comparison with Prior Nuclear Study: No images to compare  Overall Impression:  Normal stress nuclear study.  LV Ejection Fraction: 69%.  LV Wall Motion:  NL LV Function; NL Wall Motion.   Thayer Headings, Brooke Bonito., MD, [facility] 02/23/2014, 5:02 PM [address],  [place] Pager [phone]

## 2014-02-27 ENCOUNTER — Encounter: Payer: Self-pay | Admitting: Family Medicine

## 2014-02-27 ENCOUNTER — Ambulatory Visit (INDEPENDENT_AMBULATORY_CARE_PROVIDER_SITE_OTHER): Payer: Medicare Other | Admitting: Family Medicine

## 2014-02-27 VITALS — BP 186/83 | HR 62 | Temp 97.3°F | Ht 66.0 in | Wt 138.0 lb

## 2014-02-27 DIAGNOSIS — E559 Vitamin D deficiency, unspecified: Secondary | ICD-10-CM

## 2014-02-27 DIAGNOSIS — M129 Arthropathy, unspecified: Secondary | ICD-10-CM

## 2014-02-27 DIAGNOSIS — E039 Hypothyroidism, unspecified: Secondary | ICD-10-CM

## 2014-02-27 DIAGNOSIS — D649 Anemia, unspecified: Secondary | ICD-10-CM

## 2014-02-27 DIAGNOSIS — I10 Essential (primary) hypertension: Secondary | ICD-10-CM

## 2014-02-27 DIAGNOSIS — E785 Hyperlipidemia, unspecified: Secondary | ICD-10-CM

## 2014-02-27 NOTE — Patient Instructions (Addendum)
Medicare Annual Wellness Visit  [facility] and the medical providers at [facility] strive to bring you the best medical care.  In doing so we not only want to address your current medical conditions and concerns but also to detect new conditions early and prevent illness, disease and health-related problems.    Medicare offers a yearly Wellness Visit which allows our clinical staff to assess your need for preventative services including immunizations, lifestyle education, counseling to decrease risk of preventable diseases and screening for fall risk and other medical concerns.    This visit is provided free of charge (no copay) for all Medicare recipients. The clinical pharmacists at [facility] have begun to conduct these Wellness Visits which will also include a thorough review of all your medications.    As you primary medical provider recommend that you make an appointment for your Annual Wellness Visit if you have not done so already this year.  You may set up this appointment before you leave today or you may call back [phone] and schedule an appointment.  Please make sure when you call that you mention that you are scheduling your Annual Wellness Visit with the clinical pharmacist so that the appointment may be made for the proper length of time.     Continue current medications. Continue good therapeutic lifestyle changes which include good diet and exercise. Fall precautions discussed with patient. If an FOBT was given today- please return it to our front desk. If you are over 62 years old - you may need Prevnar 23 or the adult Pneumonia vaccine.  Check with the gastroenterologist regarding your colonoscopy due date. We will schedule a visit for you to see Shelah Lewandowsky for a pelvic exam. Please return at the appropriate time to get her lab work. Continue to monitor her blood pressure at home and take recorded  readings with you to your doctor's visits Start back on the coated baby aspirin Monday Wednesday and Friday If bruising increases discontinue this Keep followup appointments with the cardiologist and nephrologist

## 2014-02-27 NOTE — Progress Notes (Signed)
Subjective:    Patient ID: [name], female    DOB: September 22, 1933, 80 y.o.   MRN: [id number]  HPI Pt here for follow up and management of chronic medical problems. Patient is doing well. She is too early for her lab work at this point in time. She does bring in home blood pressure readings and these are all very good averaging 114-148 over 50s to 60s. She obviously has whitecoat hypertension. The readings on the day of coming to the office are always higher. She has no particular complaints. Her recent lab work that was done in April had an LDL particle number that was good and the goal. The triglycerides were elevated. The blood sugar was good. The creatinine was slightly elevated at 1.37.        Patient Active Problem List   Diagnosis Date Noted  . Paroxysmal atrial fibrillation 01/25/2014  . Hypokalemia 07/19/2013  . Anemia 07/19/2013  . Bilateral lower extremity edema 06/30/2013  . Hypertension with fluid overload 06/30/2013  . Need for prophylactic vaccination and inoculation against influenza 05/31/2013  . SOB (shortness of breath) 05/31/2013  . Pedal edema 05/10/2013  . Medication side effect 05/03/2013  . Varicose veins of lower extremities with other complications [id number]  . Asthmatic bronchitis 03/02/2013  . ABNORMAL STRESS ELECTROCARDIOGRAM 01/23/2010  . CHEST PAIN 12/19/2009  . HYPOTHYROIDISM 12/18/2009  . HYPERLIPIDEMIA 12/18/2009  . HYPERTENSION 12/18/2009  . RENAL INSUFFICIENCY, CHRONIC 12/18/2009  . ARTHRITIS 12/18/2009   Outpatient Encounter Prescriptions as of 02/27/2014  Medication Sig  . atenolol-chlorthalidone (TENORETIC) 50-25 MG per tablet Take 1 tablet by mouth daily.  Marland Kitchen atorvastatin (LIPITOR) 20 MG tablet Take 0.5 tablets (10 mg total) by mouth daily.  . calcium-vitamin D (OSCAL WITH D) 500-200 MG-UNIT per tablet Take 1 tablet by mouth daily.    . flecainide (TAMBOCOR) 50 MG tablet Take 1 tablet (50 mg total) by mouth 2 (two) times daily.  .  isosorbide mononitrate (IMDUR) 60 MG 24 hr tablet Take 1 tablet (60 mg total) by mouth daily.  Marland Kitchen levothyroxine (SYNTHROID, LEVOTHROID) 75 MCG tablet Take 1 tablet (75 mcg total) by mouth daily.  Marland Kitchen omega-3 acid ethyl esters (LOVAZA) 1 G capsule Take 1 g by mouth daily.    Review of Systems  Constitutional: Negative.   HENT: Negative.   Eyes: Negative.   Respiratory: Negative.   Cardiovascular: Negative.   Gastrointestinal: Negative.   Endocrine: Negative.   Genitourinary: Negative.   Musculoskeletal: Negative.   Skin: Negative.   Allergic/Immunologic: Negative.   Neurological: Negative.   Hematological: Negative.   Psychiatric/Behavioral: Negative.        Objective:   Physical Exam  Nursing note and vitals reviewed. Constitutional: She is oriented to person, place, and time. She appears well-developed and well-nourished. No distress.  HENT:  Head: Normocephalic and atraumatic.  Right Ear: External ear normal.  Left Ear: External ear normal.  Nose: Nose normal.  Mouth/Throat: Oropharynx is clear and moist.  Eyes: Conjunctivae and EOM are normal. Pupils are equal, round, and reactive to light. Right eye exhibits no discharge. Left eye exhibits no discharge. No scleral icterus.  Neck: Normal range of motion. Neck supple. No thyromegaly present.  No carotid bruits  Cardiovascular: Normal rate, regular rhythm, normal heart sounds and intact distal pulses.   No murmur heard. The rhythm is regular at 60 per minute  Pulmonary/Chest: Effort normal and breath sounds normal. No respiratory distress. She has no wheezes. She has no rales. She exhibits  no tenderness.  Abdominal: Soft. Bowel sounds are normal. She exhibits no mass. There is no tenderness. There is no rebound and no guarding.  No abdominal bruits  Musculoskeletal: Normal range of motion. She exhibits no edema and no tenderness.  Lymphadenopathy:    She has no cervical adenopathy.  Neurological: She is alert and oriented  to person, place, and time. She has normal reflexes. No cranial nerve deficit.  Skin: Skin is warm and dry. No rash noted.  There is a lot of bruising on anterior legs and dorsal hands, this is resolving.  Psychiatric: She has a normal mood and affect. Her behavior is normal. Judgment and thought content normal.   BP 186/83  Pulse 62  Temp(Src) 97.3 F (36.3 C) (Oral)  Ht 5\' 6"  (1.676 m)  Wt 138 lb (62.596 kg)  BMI 22.28 kg/m2        Assessment & Plan:  1. ARTHRITIS  2. Anemia, unspecified anemia type  3. HYPERLIPIDEMIA  4. HYPERTENSION  5. HYPOTHYROIDISM  6. Vitamin D deficiency Patient Instructions                       Medicare Annual Wellness Visit  [facility] and the medical providers at [facility] strive to bring you the best medical care.  In doing so we not only want to address your current medical conditions and concerns but also to detect new conditions early and prevent illness, disease and health-related problems.    Medicare offers a yearly Wellness Visit which allows our clinical staff to assess your need for preventative services including immunizations, lifestyle education, counseling to decrease risk of preventable diseases and screening for fall risk and other medical concerns.    This visit is provided free of charge (no copay) for all Medicare recipients. The clinical pharmacists at [facility] have begun to conduct these Wellness Visits which will also include a thorough review of all your medications.    As you primary medical provider recommend that you make an appointment for your Annual Wellness Visit if you have not done so already this year.  You may set up this appointment before you leave today or you may call back [phone] and schedule an appointment.  Please make sure when you call that you mention that you are scheduling your Annual Wellness Visit with the clinical pharmacist so that the  appointment may be made for the proper length of time.     Continue current medications. Continue good therapeutic lifestyle changes which include good diet and exercise. Fall precautions discussed with patient. If an FOBT was given today- please return it to our front desk. If you are over 73 years old - you may need Prevnar 22 or the adult Pneumonia vaccine.  Check with the gastroenterologist regarding your colonoscopy due date. We will schedule a visit for you to see Shelah Lewandowsky for a pelvic exam. Please return at the appropriate time to get her lab work. Continue to monitor her blood pressure at home and take recorded readings with you to your doctor's visits Start back on the coated baby aspirin Monday Wednesday and Friday If bruising increases discontinue this Keep followup appointments with the cardiologist and nephrologist   Arrie Senate MD

## 2014-03-03 ENCOUNTER — Telehealth: Payer: Self-pay | Admitting: Cardiology

## 2014-03-03 NOTE — Telephone Encounter (Signed)
New message ° ° ° ° °Pt want stress test results. °

## 2014-03-03 NOTE — Telephone Encounter (Signed)
Provided results of stress test to patient. She verbalized that she didn't understand why Dr. Caryl Comes took her off ASA when Dr. Meda Coffee had instructed her to stay on it. Reviewed office note from Dr. Caryl Comes. Patient had ecchymosis in a couple of places and Dr. Caryl Comes indicated he was discontinuing the ASA. Patient requested general information regarding stress test, arrhythmias, and what to do during times of tachycardia. Education provided. Patient stated that she was going to restart her ASA but only take it three times per week (Monday, Wednesday, Friday). She wants Dr. Meda Coffee to call her back if this is not okay for her to do. Advised patient that I would send the information/request to Dr. Meda Coffee. Patient's next appointment is in August with Dr. Meda Coffee.

## 2014-03-14 ENCOUNTER — Other Ambulatory Visit (INDEPENDENT_AMBULATORY_CARE_PROVIDER_SITE_OTHER): Payer: Medicare Other

## 2014-03-14 ENCOUNTER — Telehealth: Payer: Self-pay | Admitting: *Deleted

## 2014-03-14 DIAGNOSIS — M129 Arthropathy, unspecified: Secondary | ICD-10-CM

## 2014-03-14 DIAGNOSIS — E039 Hypothyroidism, unspecified: Secondary | ICD-10-CM

## 2014-03-14 DIAGNOSIS — I10 Essential (primary) hypertension: Secondary | ICD-10-CM

## 2014-03-14 DIAGNOSIS — E559 Vitamin D deficiency, unspecified: Secondary | ICD-10-CM

## 2014-03-14 DIAGNOSIS — E785 Hyperlipidemia, unspecified: Secondary | ICD-10-CM

## 2014-03-14 DIAGNOSIS — D649 Anemia, unspecified: Secondary | ICD-10-CM

## 2014-03-14 NOTE — Telephone Encounter (Signed)
LM for patient to inform her that Dr. Meda Coffee had said that it was okay for patient to take the ASA on M, W, F schedule, as discussed last week.

## 2014-03-15 ENCOUNTER — Telehealth: Payer: Self-pay | Admitting: Family Medicine

## 2014-03-15 LAB — BMP8+EGFR
BUN/Creatinine Ratio: 15 (ref 11–26)
BUN: 22 mg/dL (ref 8–27)
CALCIUM: 9.2 mg/dL (ref 8.7–10.3)
CO2: 31 mmol/L — ABNORMAL HIGH (ref 18–29)
CREATININE: 1.43 mg/dL — AB (ref 0.57–1.00)
Chloride: 98 mmol/L (ref 97–108)
GFR calc Af Amer: 40 mL/min/{1.73_m2} — ABNORMAL LOW (ref >59)
GFR, EST NON AFRICAN AMERICAN: 35 mL/min/{1.73_m2} — AB (ref >59)
GLUCOSE: 107 mg/dL — AB (ref 65–99)
Potassium: 3.9 mmol/L (ref 3.5–5.2)
Sodium: 142 mmol/L (ref 134–144)

## 2014-03-15 LAB — CBC WITH DIFFERENTIAL
Basophils Absolute: 0 10*3/uL (ref 0.0–0.2)
Basos: 0 %
EOS: 2 %
Eosinophils Absolute: 0.1 10*3/uL (ref 0.0–0.4)
HEMATOCRIT: 34.2 % (ref 34.0–46.6)
Hemoglobin: 11.7 g/dL (ref 11.1–15.9)
IMMATURE GRANULOCYTES: 0 %
Immature Grans (Abs): 0 10*3/uL (ref 0.0–0.1)
LYMPHS ABS: 1.1 10*3/uL (ref 0.7–3.1)
Lymphs: 36 %
MCH: 28.4 pg (ref 26.6–33.0)
MCHC: 34.2 g/dL (ref 31.5–35.7)
MCV: 83 fL (ref 79–97)
MONOCYTES: 10 %
Monocytes Absolute: 0.3 10*3/uL (ref 0.1–0.9)
NEUTROS ABS: 1.5 10*3/uL (ref 1.4–7.0)
Neutrophils Relative %: 52 %
Platelets: 138 10*3/uL — ABNORMAL LOW (ref 150–379)
RBC: 4.12 x10E6/uL (ref 3.77–5.28)
RDW: 14 % (ref 12.3–15.4)
WBC: 3 10*3/uL — ABNORMAL LOW (ref 3.4–10.8)

## 2014-03-15 LAB — NMR, LIPOPROFILE
Cholesterol: 143 mg/dL (ref 100–199)
HDL CHOLESTEROL BY NMR: 42 mg/dL (ref >39)
HDL Particle Number: 30.4 umol/L — ABNORMAL LOW (ref >=30.5)
LDL Particle Number: 912 nmol/L (ref <1000)
LDL Size: 19.7 nm (ref >20.5)
LDLC SERPL CALC-MCNC: 76 mg/dL (ref 0–99)
LP-IR Score: 51 — ABNORMAL HIGH (ref <=45)
SMALL LDL PARTICLE NUMBER: 672 nmol/L — AB (ref <=527)
TRIGLYCERIDES BY NMR: 125 mg/dL (ref 0–149)

## 2014-03-15 LAB — HEPATIC FUNCTION PANEL
ALT: 16 IU/L (ref 0–32)
AST: 21 IU/L (ref 0–40)
Albumin: 4.2 g/dL (ref 3.5–4.8)
Alkaline Phosphatase: 66 IU/L (ref 39–117)
BILIRUBIN DIRECT: 0.14 mg/dL (ref 0.00–0.40)
BILIRUBIN TOTAL: 0.6 mg/dL (ref 0.0–1.2)
TOTAL PROTEIN: 6.5 g/dL (ref 6.0–8.5)

## 2014-03-15 LAB — VITAMIN D 25 HYDROXY (VIT D DEFICIENCY, FRACTURES): Vit D, 25-Hydroxy: 60.1 ng/mL (ref 30.0–100.0)

## 2014-03-15 NOTE — Telephone Encounter (Signed)
Message copied by Cline Crock on Wed Mar 15, 2014  8:29 AM ------      Message from: Chipper Herb      Created: Wed Mar 15, 2014  7:32 AM       The CBC has a white blood cell count is slightly decreased, and this is consistent with past readings. The hemoglobin is stable at 11.7 and this is also consistent with past readings. The platelet count  Is slightly decreased but also consistent with past readings.      The blood sugar is slightly increased at 107. The creatinine the most important kidney function test remains elevated but stable when compared to past readings. The electrolytes are good and the potassium is within normal limits      All liver function tests are within normal limits      Advanced lipid testing, the total LDL particle number is excellent and at goal. The triglycerides are good and improved from before. The LDL C. is also good at goal. Continue current treatment and aggressive therapeutic lifestyle changes      The vitamin D level is good, continue current treatment ------

## 2014-03-31 ENCOUNTER — Telehealth: Payer: Self-pay | Admitting: Cardiology

## 2014-03-31 NOTE — Telephone Encounter (Signed)
New Prob   States her BP has been running high lately. Requesting to speak to a nurse. Please call.

## 2014-03-31 NOTE — Telephone Encounter (Signed)
Pt calling Dr Meda Coffee to make her aware that her BP readings have been running high. Pt unsure if her BP cuff is working properly.  Pt states she's asymptomatic, with no complaints of cp, sob, palpitations, LEE, dizziness, HA, feeling faint, and no syncopal episodes.  Pt states she was just seen by her PCP about 2 weeks ago, and everything checked out pretty well. Pt states she is getting BP readings ranging from 142/70- to about 162/76.  Pt states she is taking her BP at different times throughout the day.  Pt states she is scheduled for a f/u OV with Dr Meda Coffee on 8/24, but thinks the BP issue should be addressed before then.  Pt states she is being compliant with taking all her prescribed meds.  Pt states that the current meds she was prescribed, are not working appropriately in controlling her BP.  Asked pt if she is keeping her sodium intake to a healthy minimum.  Pt states she is trying to.  Informed pt that Dr Meda Coffee is out of the office today, but I will forward this message to her for further review and recommendation, and follow-up thereafter.  Informed pt that if she becomes symptomatic at all with cp, sob, palpitations, HA, stroke like symptoms, feeling dizzy and faint, then she needs to call 911 and inform our office.  Advised pt to continue with med compliance and keeping her sodium intake to a healthy minimum, and stay well hydrated.  Pt verbalized understanding and agrees with this plan.

## 2014-04-02 ENCOUNTER — Encounter (HOSPITAL_COMMUNITY): Payer: Self-pay | Admitting: Emergency Medicine

## 2014-04-02 ENCOUNTER — Observation Stay (HOSPITAL_COMMUNITY)
Admission: EM | Admit: 2014-04-02 | Discharge: 2014-04-04 | Disposition: A | Discharge status: Home / Self Care | Payer: Medicare Other | Attending: Internal Medicine | Admitting: Internal Medicine

## 2014-04-02 ENCOUNTER — Emergency Department (HOSPITAL_COMMUNITY): Payer: Medicare Other

## 2014-04-02 DIAGNOSIS — E785 Hyperlipidemia, unspecified: Secondary | ICD-10-CM

## 2014-04-02 DIAGNOSIS — N189 Chronic kidney disease, unspecified: Secondary | ICD-10-CM

## 2014-04-02 DIAGNOSIS — I5031 Acute diastolic (congestive) heart failure: Secondary | ICD-10-CM

## 2014-04-02 DIAGNOSIS — E039 Hypothyroidism, unspecified: Secondary | ICD-10-CM

## 2014-04-02 DIAGNOSIS — I4891 Unspecified atrial fibrillation: Secondary | ICD-10-CM | POA: Insufficient documentation

## 2014-04-02 DIAGNOSIS — E782 Mixed hyperlipidemia: Secondary | ICD-10-CM | POA: Diagnosis present

## 2014-04-02 DIAGNOSIS — R519 Headache, unspecified: Secondary | ICD-10-CM | POA: Diagnosis present

## 2014-04-02 DIAGNOSIS — I129 Hypertensive chronic kidney disease with stage 1 through stage 4 chronic kidney disease, or unspecified chronic kidney disease: Secondary | ICD-10-CM | POA: Diagnosis not present

## 2014-04-02 DIAGNOSIS — Z66 Do not resuscitate: Secondary | ICD-10-CM | POA: Diagnosis not present

## 2014-04-02 DIAGNOSIS — Z23 Encounter for immunization: Secondary | ICD-10-CM

## 2014-04-02 DIAGNOSIS — R51 Headache: Secondary | ICD-10-CM

## 2014-04-02 DIAGNOSIS — E8779 Other fluid overload: Secondary | ICD-10-CM

## 2014-04-02 DIAGNOSIS — I10 Essential (primary) hypertension: Secondary | ICD-10-CM | POA: Diagnosis present

## 2014-04-02 DIAGNOSIS — N183 Chronic kidney disease, stage 3 unspecified: Secondary | ICD-10-CM | POA: Diagnosis not present

## 2014-04-02 DIAGNOSIS — E876 Hypokalemia: Secondary | ICD-10-CM | POA: Diagnosis not present

## 2014-04-02 DIAGNOSIS — R9431 Abnormal electrocardiogram [ECG] [EKG]: Secondary | ICD-10-CM

## 2014-04-02 DIAGNOSIS — I509 Heart failure, unspecified: Secondary | ICD-10-CM | POA: Insufficient documentation

## 2014-04-02 DIAGNOSIS — R600 Localized edema: Secondary | ICD-10-CM

## 2014-04-02 DIAGNOSIS — I83893 Varicose veins of bilateral lower extremities with other complications: Secondary | ICD-10-CM

## 2014-04-02 DIAGNOSIS — I5032 Chronic diastolic (congestive) heart failure: Secondary | ICD-10-CM

## 2014-04-02 DIAGNOSIS — I480 Paroxysmal atrial fibrillation: Secondary | ICD-10-CM

## 2014-04-02 DIAGNOSIS — I160 Hypertensive urgency: Secondary | ICD-10-CM

## 2014-04-02 DIAGNOSIS — Z7982 Long term (current) use of aspirin: Secondary | ICD-10-CM | POA: Diagnosis not present

## 2014-04-02 DIAGNOSIS — M129 Arthropathy, unspecified: Secondary | ICD-10-CM

## 2014-04-02 LAB — CBC WITH DIFFERENTIAL/PLATELET
BASOS PCT: 0 % (ref 0–1)
Basophils Absolute: 0 10*3/uL (ref 0.0–0.1)
EOS PCT: 0 % (ref 0–5)
Eosinophils Absolute: 0 10*3/uL (ref 0.0–0.7)
HEMATOCRIT: 39.1 % (ref 36.0–46.0)
HEMOGLOBIN: 13.3 g/dL (ref 12.0–15.0)
LYMPHS ABS: 0.7 10*3/uL (ref 0.7–4.0)
Lymphocytes Relative: 13 % (ref 12–46)
MCH: 28.8 pg (ref 26.0–34.0)
MCHC: 34 g/dL (ref 30.0–36.0)
MCV: 84.6 fL (ref 78.0–100.0)
MONO ABS: 0.3 10*3/uL (ref 0.1–1.0)
Monocytes Relative: 5 % (ref 3–12)
Neutro Abs: 4.7 10*3/uL (ref 1.7–7.7)
Neutrophils Relative %: 82 % — ABNORMAL HIGH (ref 43–77)
Platelets: 125 10*3/uL — ABNORMAL LOW (ref 150–400)
RBC: 4.62 MIL/uL (ref 3.87–5.11)
RDW: 13.8 % (ref 11.5–15.5)
WBC: 5.7 10*3/uL (ref 4.0–10.5)

## 2014-04-02 LAB — BASIC METABOLIC PANEL
ANION GAP: 13 (ref 5–15)
BUN: 22 mg/dL (ref 6–23)
CALCIUM: 9.9 mg/dL (ref 8.4–10.5)
CO2: 31 mEq/L (ref 19–32)
CREATININE: 1.29 mg/dL — AB (ref 0.50–1.10)
Chloride: 97 mEq/L (ref 96–112)
GFR calc Af Amer: 45 mL/min — ABNORMAL LOW (ref >90)
GFR calc non Af Amer: 39 mL/min — ABNORMAL LOW (ref >90)
GLUCOSE: 136 mg/dL — AB (ref 70–99)
POTASSIUM: 3.3 meq/L — AB (ref 3.7–5.3)
Sodium: 141 mEq/L (ref 137–147)

## 2014-04-02 LAB — PRO B NATRIURETIC PEPTIDE: Pro B Natriuretic peptide (BNP): 839.1 pg/mL — ABNORMAL HIGH (ref 0–450)

## 2014-04-02 LAB — TSH: TSH: 1.95 u[IU]/mL (ref 0.350–4.500)

## 2014-04-02 LAB — TROPONIN I: Troponin I: <0.3 ng/mL (ref <0.30)

## 2014-04-02 MED ORDER — SODIUM CHLORIDE 0.9 % IJ SOLN
3.0000 mL | Freq: Two times a day (BID) | INTRAMUSCULAR | Status: DC
  Administered 2014-04-02 – 2014-04-03 (×2): 3 mL via INTRAVENOUS

## 2014-04-02 MED ORDER — CHLORTHALIDONE 25 MG PO TABS
25.0000 mg | ORAL_TABLET | ORAL | Status: AC
  Administered 2014-04-02: 25 mg via ORAL
  Filled 2014-04-02: qty 1

## 2014-04-02 MED ORDER — ATENOLOL 50 MG PO TABS
50.0000 mg | ORAL_TABLET | ORAL | Status: AC
  Administered 2014-04-02: 50 mg via ORAL
  Filled 2014-04-02: qty 1

## 2014-04-02 MED ORDER — CALCIUM CARBONATE-VITAMIN D 500-200 MG-UNIT PO TABS
1.0000 | ORAL_TABLET | Freq: Every day | ORAL | Status: DC
  Administered 2014-04-02 – 2014-04-04 (×3): 1 via ORAL
  Filled 2014-04-02 (×3): qty 1

## 2014-04-02 MED ORDER — SODIUM CHLORIDE 0.9 % IJ SOLN: 3.0000 mL | INTRAMUSCULAR | Status: DC | PRN

## 2014-04-02 MED ORDER — ACETAMINOPHEN 325 MG PO TABS
650.0000 mg | ORAL_TABLET | Freq: Once | ORAL | Status: AC
  Administered 2014-04-02: 650 mg via ORAL
  Filled 2014-04-02: qty 2

## 2014-04-02 MED ORDER — SODIUM CHLORIDE 0.9 % IV BOLUS (SEPSIS)
1000.0000 mL | Freq: Once | INTRAVENOUS | Status: AC
  Administered 2014-04-02: 1000 mL via INTRAVENOUS

## 2014-04-02 MED ORDER — ONDANSETRON HCL 4 MG/2ML IJ SOLN
4.0000 mg | Freq: Four times a day (QID) | INTRAMUSCULAR | Status: DC | PRN
  Administered 2014-04-02: 4 mg via INTRAVENOUS
  Filled 2014-04-02: qty 2

## 2014-04-02 MED ORDER — HYDRALAZINE HCL 20 MG/ML IJ SOLN
5.0000 mg | Freq: Once | INTRAMUSCULAR | Status: AC
  Administered 2014-04-02: 5 mg via INTRAVENOUS
  Filled 2014-04-02: qty 1

## 2014-04-02 MED ORDER — ONDANSETRON HCL 4 MG PO TABS: 4.0000 mg | ORAL_TABLET | Freq: Four times a day (QID) | ORAL | Status: DC | PRN

## 2014-04-02 MED ORDER — ATENOLOL-CHLORTHALIDONE 50-25 MG PO TABS: 1.0000 | ORAL_TABLET | Freq: Every day | ORAL | Status: DC

## 2014-04-02 MED ORDER — ACETAMINOPHEN 325 MG PO TABS
325.0000 mg | ORAL_TABLET | Freq: Once | ORAL | Status: AC
  Administered 2014-04-02: 325 mg via ORAL
  Filled 2014-04-02: qty 1

## 2014-04-02 MED ORDER — ASPIRIN EC 81 MG PO TBEC
81.0000 mg | DELAYED_RELEASE_TABLET | ORAL | Status: DC
  Administered 2014-04-02 – 2014-04-04 (×2): 81 mg via ORAL
  Filled 2014-04-02 (×2): qty 1

## 2014-04-02 MED ORDER — SODIUM CHLORIDE 0.9 % IV SOLN: 250.0000 mL | INTRAVENOUS | Status: DC | PRN

## 2014-04-02 MED ORDER — ONDANSETRON HCL 4 MG/2ML IJ SOLN
4.0000 mg | Freq: Once | INTRAMUSCULAR | Status: AC
  Administered 2014-04-02: 4 mg via INTRAVENOUS
  Filled 2014-04-02: qty 2

## 2014-04-02 MED ORDER — FLECAINIDE ACETATE 50 MG PO TABS
50.0000 mg | ORAL_TABLET | Freq: Two times a day (BID) | ORAL | Status: DC
  Administered 2014-04-03 – 2014-04-04 (×2): 50 mg via ORAL
  Filled 2014-04-02 (×4): qty 1

## 2014-04-02 MED ORDER — MORPHINE SULFATE 2 MG/ML IJ SOLN: 1.0000 mg | INTRAMUSCULAR | Status: DC | PRN

## 2014-04-02 MED ORDER — ISOSORBIDE MONONITRATE ER 60 MG PO TB24
60.0000 mg | ORAL_TABLET | Freq: Every day | ORAL | Status: DC
  Administered 2014-04-03 – 2014-04-04 (×2): 60 mg via ORAL
  Filled 2014-04-02 (×2): qty 1

## 2014-04-02 MED ORDER — ATENOLOL 50 MG PO TABS
50.0000 mg | ORAL_TABLET | Freq: Every day | ORAL | Status: DC
  Administered 2014-04-03 – 2014-04-04 (×2): 50 mg via ORAL
  Filled 2014-04-02 (×2): qty 1

## 2014-04-02 MED ORDER — LEVOTHYROXINE SODIUM 75 MCG PO TABS
75.0000 ug | ORAL_TABLET | Freq: Every day | ORAL | Status: DC
  Administered 2014-04-04: 75 ug via ORAL
  Filled 2014-04-02 (×3): qty 1

## 2014-04-02 MED ORDER — MORPHINE SULFATE 4 MG/ML IJ SOLN
4.0000 mg | Freq: Once | INTRAMUSCULAR | Status: AC
  Administered 2014-04-02: 4 mg via INTRAVENOUS
  Filled 2014-04-02: qty 1

## 2014-04-02 MED ORDER — CHLORTHALIDONE 25 MG PO TABS
25.0000 mg | ORAL_TABLET | Freq: Every day | ORAL | Status: DC
  Administered 2014-04-03 – 2014-04-04 (×2): 25 mg via ORAL
  Filled 2014-04-02 (×2): qty 1

## 2014-04-02 MED ORDER — METOPROLOL TARTRATE 1 MG/ML IV SOLN
2.5000 mg | Freq: Four times a day (QID) | INTRAVENOUS | Status: DC | PRN
  Administered 2014-04-02 – 2014-04-03 (×2): 2.5 mg via INTRAVENOUS
  Filled 2014-04-02: qty 5

## 2014-04-02 NOTE — H&P (Signed)
History and Physical  [name] [id number] DOB: 07/18/1934 DOA: 04/02/2014  Referring physician: Thayer Jew, ER physician PCP: Redge Gainer, MD   Chief Complaint: Headache  HPI: [name] is a 80 y.o. female  Past medical history of stage III chronic kidney disease and hypertension who has been compliant with her medications but overall has had some difficulty controlling her blood pressure with systolic usually ranging [id number]. She stated for the past week even know she's been taking her medicines, she is noted her blood pressure has been higher in the 160s to 170s. This morning she woke up with a severe headache all over, going down the back of her neck and she felt quite ill had episodes of nausea and vomiting. Patient took her medicines but was not able to keep them down. She came in emergency room for further evaluation. Her blood pressure was noted to be in the 200s. A CT scan of the head was unremarkable for subarachnoid bleed. Labs were done which were unremarkable noting only her stage III chronic kidney disease. Patient was given a dose of IV hydralazine which she did respond to her pressures improved, but then started to trend back up. hospitalists were called for further evaluation and admission   Review of Systems:  Patient seen in the emergency room. Pt complains of a mild headache, although better from earlier.  She feels somewhat weak and fatigued. Mildly nauseated.  Pt denies any vision changes, dysphagia, chest pain, palpitations, shortness of breath, wheeze, cough, abdominal pain, hematuria, dysuria, constipation, diarrhea, focal extremity numbness or weakness or pain. Review systems is otherwise negative..  Review of systems are otherwise negative  Past Medical History  Diagnosis Date  . Nodule of right lung     Right upper lobe  . Hyperlipidemia   . Hypertension   . Hypothyroidism   . Renal insufficiency     Chronic  . Arthritis    Past Surgical  History  Procedure Laterality Date  . Thyroidectomy, partial     Social History:  reports that she has never smoked. She does not have any smokeless tobacco history on file. She reports that she does not drink alcohol or use illicit drugs. Patient lives at home with her husband & is able to participate in activities of daily living with out assistance. She is quite ambulatory and walks 20 minutes daily by herself unassisted  Allergies  Allergen Reactions  . Clonidine Derivatives Nausea Only  . Terazosin Hives and Nausea Only  . Sulfonamide Derivatives Nausea Only and Rash    Family History  Problem Relation Age of Onset  . Thyroid disease Mother   . Stroke Father     As reviewed with family  Prior to Admission medications   Medication Sig Start Date End Date Taking? Authorizing Provider  aspirin EC 81 MG tablet Take 81 mg by mouth every other day.   Yes Historical Provider, MD  atenolol-chlorthalidone (TENORETIC) 50-25 MG per tablet Take 1 tablet by mouth daily. 07/19/13  Yes Vernie Shanks, MD  atorvastatin (LIPITOR) 20 MG tablet Take 0.5 tablets (10 mg total) by mouth daily. 01/10/14  Yes Chipper Herb, MD  calcium-vitamin D (OSCAL WITH D) 500-200 MG-UNIT per tablet Take 1 tablet by mouth daily.     Yes Historical Provider, MD  flecainide (TAMBOCOR) 50 MG tablet Take 1 tablet (50 mg total) by mouth 2 (two) times daily. 02/09/14  Yes Deboraha Sprang, MD  isosorbide mononitrate (IMDUR) 60 MG 24  hr tablet Take 1 tablet (60 mg total) by mouth daily. 01/24/14  Yes Dorothy Spark, MD  levothyroxine (SYNTHROID, LEVOTHROID) 75 MCG tablet Take 1 tablet (75 mcg total) by mouth daily. 07/19/13  Yes Vernie Shanks, MD  Omega-3 Fatty Acids (FISH OIL) 1200 MG CAPS Take 1,200 mg by mouth daily.   Yes Historical Provider, MD    Physical Exam: BP 161/64  Pulse 76  Temp(Src) 97.7 F (36.5 C) (Oral)  Resp 13  Ht 5' 5.5" (1.664 m)  Wt 62.596 kg (138 lb)  BMI 22.61 kg/m2  SpO2 95%  General:   Alert and oriented x3, no acute distress, fatigued Eyes: Sclera nonicteric, extraocular movements are intact ENT: Normocephalic, atraumatic, mucous members are slightly dry Neck: No JVD Cardiovascular: Regular rate and rhythm, [id number], soft 2/6 systolic ejection murmur Respiratory: Clear to auscultation bilaterally Abdomen: Soft, nontender, nondistended, positive bowel sounds Skin: No skin breaks, tears or lesions Musculoskeletal: No clubbing or cyanosis or edema Psychiatric: Patient appropriate, no evidence of psychoses Neurologic: No focal deficits, downgoing toes, no sensory deficits           Labs on Admission:  Basic Metabolic Panel:  Recent Labs Lab 04/02/14 1130  NA 141  K 3.3*  CL 97  CO2 31  GLUCOSE 136*  BUN 22  CREATININE 1.29*  CALCIUM 9.9   Liver Function Tests: No results found for this basename: AST, ALT, ALKPHOS, BILITOT, PROT, ALBUMIN,  in the last 168 hours No results found for this basename: LIPASE, AMYLASE,  in the last 168 hours No results found for this basename: AMMONIA,  in the last 168 hours CBC:  Recent Labs Lab 04/02/14 1130  WBC 5.7  NEUTROABS 4.7  HGB 13.3  HCT 39.1  MCV 84.6  PLT 125*   Cardiac Enzymes: No results found for this basename: CKTOTAL, CKMB, CKMBINDEX, TROPONINI,  in the last 168 hours  BNP (last 3 results)  Recent Labs  06/30/13 0950  PROBNP 132.0*   CBG: No results found for this basename: GLUCAP,  in the last 168 hours  Radiological Exams on Admission: Ct Head Wo Contrast  04/02/2014  FINDINGS: Minimal atrophy.  Normal ventricular morphology.  No midline shift or mass effect.  Otherwise normal appearance of brain parenchyma.  No intracranial hemorrhage, mass lesion, or evidence acute infarction.  No extra-axial fluid collections.  Bones and sinuses unremarkable.  IMPRESSION: No acute intracranial abnormalities.   Electronically Signed   By: Lavonia Dana M.D.   On: 04/02/2014 12:18    EKG: Independently reviewed.  Normal sinus rhythm with mild prolonged PR, prolonged QT  Assessment/Plan Present on Admission:  . Hypertensive urgency: Continue home medications, when necessary Lopressor.  Patient reports that she has been compliant with her medications. Consider maybe an underlying issue which is boosting up her blood pressure such as CHF or possibly even a CVA. Check BNP, TSH. If workup unrevealing, would consider MRI tomorrow which may so occult CVA  . HYPOTHYROIDISM: Stable. Continue Synthroid.  Marland Kitchen HYPERLIPIDEMIA: Stable. Continue statin.  . Paroxysmal atrial fibrillation: Keep on telemetry. Normal sinus rhythm. Patient not on anticoagulation.  Marland Kitchen Headache: Likely secondary to blood pressure urgency.  . CKD (chronic kidney disease) stage 3, GFR 30-59 ml/min: Looks to be at baseline.  . Hypokalemia we will replace  Consultants: None  Code Status: DO NOT RESUSCITATE as confirmed by patient  Family Communication: Husband and daughters at the bedside   Disposition Plan: Observe overnight. The patient able to take by  mouth and blood pressure remained stable. Likely home tomorrow  Time spent: 35 minutes  Annita Brod [facility] Pager [phone]  **Disclaimer: This note may have been dictated with voice recognition software. Similar sounding words can inadvertently be transcribed and this note may contain transcription errors which may not have been corrected upon publication of note.**

## 2014-04-02 NOTE — ED Notes (Signed)
Pt woke up today with severe headache and vomiting and high BP. Friday pt called her PMD because her BP was high. The Dr was out of the office and would get back to her Monday.

## 2014-04-02 NOTE — ED Provider Notes (Signed)
CSN: [id number]     Arrival date & time 04/02/14  1047 History   First MD Initiated Contact with Patient 04/02/14 1111     Chief Complaint  Patient presents with  . Hypertension  . Headache  . Emesis     (Consider location/radiation/quality/duration/timing/severity/associated sxs/prior Treatment) HPI  This is a 80 year old female with history of hypertension, hyperlipidemia, hypothyroidism who presents with headache, vomiting, and high blood pressure. Patient reports that she has recently had difficulty keeping her blood pressures under control. She is being managed by primary care physician, cardiology, and a nephrologist. She is on atenolol, chlorthalidone, Imdur, and flecainide.  Patient reports that she got up at approximately 6 AM this morning. She developed a 9/10 headache very quickly. Reports that the headache is sharp and mostly occipital. She reports associated emesis without abdominal pain. She states that she tried to take her blood pressure medication but vomited it up. She denies any vision changes, weakness, numbness, or tingling.  She denies any recent illnesses. She denies any fevers or neck stiffness.  Past Medical History  Diagnosis Date  . Nodule of right lung     Right upper lobe  . Hyperlipidemia   . Hypertension   . Hypothyroidism   . Renal insufficiency     Chronic  . Arthritis    Past Surgical History  Procedure Laterality Date  . Thyroidectomy, partial     Family History  Problem Relation Age of Onset  . Thyroid disease Mother   . Stroke Father    History  Substance Use Topics  . Smoking status: Never Smoker   . Smokeless tobacco: Not on file     Comment: Pt denies cigarettes  . Alcohol Use: No   OB History   Grav Para Term Preterm Abortions TAB SAB Ect Mult Living                 Review of Systems  Constitutional: Negative for fever.  Eyes: Negative for visual disturbance.  Respiratory: Negative for cough, chest tightness and shortness of  breath.   Cardiovascular: Negative for chest pain.  Gastrointestinal: Positive for nausea and vomiting. Negative for abdominal pain and diarrhea.  Genitourinary: Negative for dysuria.  Musculoskeletal: Negative for back pain.  Neurological: Positive for light-headedness and headaches. Negative for dizziness, weakness and numbness.  Psychiatric/Behavioral: Negative for confusion.  All other systems reviewed and are negative.     Allergies  Clonidine derivatives; Terazosin; and Sulfonamide derivatives  Home Medications   Prior to Admission medications   Medication Sig Start Date End Date Taking? Authorizing Provider  aspirin EC 81 MG tablet Take 81 mg by mouth every other day.   Yes Historical Provider, MD  atenolol-chlorthalidone (TENORETIC) 50-25 MG per tablet Take 1 tablet by mouth daily. 07/19/13  Yes Vernie Shanks, MD  atorvastatin (LIPITOR) 20 MG tablet Take 0.5 tablets (10 mg total) by mouth daily. 01/10/14  Yes Chipper Herb, MD  calcium-vitamin D (OSCAL WITH D) 500-200 MG-UNIT per tablet Take 1 tablet by mouth daily.     Yes Historical Provider, MD  flecainide (TAMBOCOR) 50 MG tablet Take 1 tablet (50 mg total) by mouth 2 (two) times daily. 02/09/14  Yes Deboraha Sprang, MD  isosorbide mononitrate (IMDUR) 60 MG 24 hr tablet Take 1 tablet (60 mg total) by mouth daily. 01/24/14  Yes Dorothy Spark, MD  levothyroxine (SYNTHROID, LEVOTHROID) 75 MCG tablet Take 1 tablet (75 mcg total) by mouth daily. 07/19/13  Yes Vernie Shanks,  MD  Omega-3 Fatty Acids (FISH OIL) 1200 MG CAPS Take 1,200 mg by mouth daily.   Yes Historical Provider, MD   BP 157/62  Pulse 70  Temp(Src) 97.7 F (36.5 C) (Oral)  Resp 13  Ht 5' 5.5" (1.664 m)  Wt 138 lb (62.596 kg)  BMI 22.61 kg/m2  SpO2 91% Physical Exam  Nursing note and vitals reviewed. Constitutional: She is oriented to person, place, and time. No distress.  Elderly  HENT:  Head: Normocephalic and atraumatic.  Mouth/Throat: Oropharynx is  clear and moist.  Eyes: EOM are normal. Pupils are equal, round, and reactive to light.  Neck: Normal range of motion. Neck supple.  No meningismus  Cardiovascular: Normal rate, regular rhythm and normal heart sounds.   No murmur heard. Pulmonary/Chest: Effort normal and breath sounds normal. No respiratory distress. She has no wheezes.  Abdominal: Soft. Bowel sounds are normal. There is no tenderness. There is no rebound and no guarding.  Musculoskeletal: She exhibits no edema.  Neurological: She is alert and oriented to person, place, and time. No cranial nerve deficit.  Cranial nerves II through XII intact, 5 out of 5 strength in all 4 extremities, no dysmetria to finger-nose-finger  Skin: Skin is warm and dry.  Psychiatric:  Anxious    ED Course  Procedures (including critical care time) Labs Review Labs Reviewed  CBC WITH DIFFERENTIAL - Abnormal; Notable for the following:    Platelets 125 (*)    Neutrophils Relative % 82 (*)    All other components within normal limits  BASIC METABOLIC PANEL - Abnormal; Notable for the following:    Potassium 3.3 (*)    Glucose, Bld 136 (*)    Creatinine, Ser 1.29 (*)    GFR calc non Af Amer 39 (*)    GFR calc Af Amer 45 (*)    All other components within normal limits    Imaging Review Ct Head Wo Contrast  04/02/2014   CLINICAL DATA:  Severe headache, vomiting, high blood pressure, history hypertension, renal insufficiency  EXAM: CT HEAD WITHOUT CONTRAST  TECHNIQUE: Contiguous axial images were obtained from the base of the skull through the vertex without intravenous contrast.  COMPARISON:  11/28/2007  FINDINGS: Minimal atrophy.  Normal ventricular morphology.  No midline shift or mass effect.  Otherwise normal appearance of brain parenchyma.  No intracranial hemorrhage, mass lesion, or evidence acute infarction.  No extra-axial fluid collections.  Bones and sinuses unremarkable.  IMPRESSION: No acute intracranial abnormalities.    Electronically Signed   By: Lavonia Dana M.D.   On: 04/02/2014 12:18     EKG Interpretation   Date/Time:  Sunday April 02 2014 11:35:49 EDT Ventricular Rate:  56 PR Interval:  223 QRS Duration: 108 QT Interval:  511 QTC Calculation: 493 R Axis:   80 Text Interpretation:  Sinus rhythm Prolonged PR interval Consider left  atrial enlargement Borderline prolonged QT interval Confirmed by Dina Rich   MD, Brinckerhoff ([id number]) on 04/02/2014 12:45:04 PM      MDM   Final diagnoses:  Hypertensive urgency    Patient presents with headache and vomiting in the setting of hypertension. Blood pressure 191/55 arrival. She's been unable to tolerate her oral blood pressure medications. Basic labwork obtained as well as EKG. Patient was given IV hydralazine, fluids, and Zofran. Patient reports improvement of her headache following hydralazine. Blood pressure improved to 138/50. CT head obtained and is negative for acute bleed. This is within a six-hour window of onset of headache  which is sensitive for ruling out subarachnoid hemorrhage as well. Low suspicion at this time for infectious etiology of headache given patient has been afebrile and has no evidence of meningismus. On repeat exam, patient continues to be neurologically intact. Her blood pressure is beginning to rise again and patient is now reporting worsening headache with a blood pressure 161/64. Basic labwork is unremarkable as well his EKG. Given ongoing headache with rising blood pressures, will admit for hypertensive urgency. Discuss with Dr. Maryland Pink.    Merryl Hacker, MD 04/03/14 [phone]

## 2014-04-03 ENCOUNTER — Observation Stay (HOSPITAL_COMMUNITY): Payer: Medicare Other

## 2014-04-03 DIAGNOSIS — I5031 Acute diastolic (congestive) heart failure: Secondary | ICD-10-CM | POA: Diagnosis present

## 2014-04-03 DIAGNOSIS — I517 Cardiomegaly: Secondary | ICD-10-CM

## 2014-04-03 DIAGNOSIS — I509 Heart failure, unspecified: Secondary | ICD-10-CM

## 2014-04-03 DIAGNOSIS — N183 Chronic kidney disease, stage 3 unspecified: Secondary | ICD-10-CM

## 2014-04-03 DIAGNOSIS — I5032 Chronic diastolic (congestive) heart failure: Secondary | ICD-10-CM | POA: Insufficient documentation

## 2014-04-03 LAB — CBC
HEMATOCRIT: 34.3 % — AB (ref 36.0–46.0)
Hemoglobin: 11.2 g/dL — ABNORMAL LOW (ref 12.0–15.0)
MCH: 27.7 pg (ref 26.0–34.0)
MCHC: 32.7 g/dL (ref 30.0–36.0)
MCV: 84.7 fL (ref 78.0–100.0)
PLATELETS: 113 10*3/uL — AB (ref 150–400)
RBC: 4.05 MIL/uL (ref 3.87–5.11)
RDW: 14.1 % (ref 11.5–15.5)
WBC: 4.7 10*3/uL (ref 4.0–10.5)

## 2014-04-03 LAB — TROPONIN I
Troponin I: <0.3 ng/mL (ref <0.30)
Troponin I: <0.3 ng/mL (ref <0.30)

## 2014-04-03 LAB — BASIC METABOLIC PANEL
ANION GAP: 10 (ref 5–15)
BUN: 22 mg/dL (ref 6–23)
CALCIUM: 9.3 mg/dL (ref 8.4–10.5)
CO2: 31 meq/L (ref 19–32)
Chloride: 97 mEq/L (ref 96–112)
Creatinine, Ser: 1.45 mg/dL — ABNORMAL HIGH (ref 0.50–1.10)
GFR calc Af Amer: 39 mL/min — ABNORMAL LOW (ref >90)
GFR, EST NON AFRICAN AMERICAN: 34 mL/min — AB (ref >90)
GLUCOSE: 110 mg/dL — AB (ref 70–99)
Potassium: 3.2 mEq/L — ABNORMAL LOW (ref 3.7–5.3)
SODIUM: 138 meq/L (ref 137–147)

## 2014-04-03 MED ORDER — POTASSIUM CHLORIDE CRYS ER 20 MEQ PO TBCR: 20.0000 meq | EXTENDED_RELEASE_TABLET | Freq: Every day | ORAL | Status: DC

## 2014-04-03 MED ORDER — POTASSIUM CHLORIDE CRYS ER 20 MEQ PO TBCR
40.0000 meq | EXTENDED_RELEASE_TABLET | Freq: Once | ORAL | Status: AC
  Administered 2014-04-03: 40 meq via ORAL
  Filled 2014-04-03: qty 2

## 2014-04-03 MED ORDER — FUROSEMIDE 10 MG/ML IJ SOLN
20.0000 mg | Freq: Once | INTRAMUSCULAR | Status: AC
  Administered 2014-04-03: 20 mg via INTRAVENOUS
  Filled 2014-04-03 (×3): qty 2

## 2014-04-03 MED ORDER — FUROSEMIDE 20 MG PO TABS: 20.0000 mg | ORAL_TABLET | Freq: Every day | ORAL | Status: DC

## 2014-04-03 MED ORDER — HYDRALAZINE HCL 50 MG PO TABS
50.0000 mg | ORAL_TABLET | Freq: Four times a day (QID) | ORAL | Status: DC | PRN
  Administered 2014-04-04: 50 mg via ORAL
  Filled 2014-04-03 (×2): qty 1

## 2014-04-03 MED ORDER — LISINOPRIL 5 MG PO TABS: 5.0000 mg | ORAL_TABLET | Freq: Every day | ORAL | Status: DC

## 2014-04-03 MED ORDER — FUROSEMIDE 20 MG PO TABS: 40.0000 mg | ORAL_TABLET | Freq: Every day | ORAL | Status: DC

## 2014-04-03 MED ORDER — LISINOPRIL 5 MG PO TABS
5.0000 mg | ORAL_TABLET | Freq: Every day | ORAL | Status: DC
  Administered 2014-04-03 – 2014-04-04 (×2): 5 mg via ORAL
  Filled 2014-04-03 (×2): qty 1

## 2014-04-03 MED ORDER — FUROSEMIDE 20 MG PO TABS
20.0000 mg | ORAL_TABLET | Freq: Every day | ORAL | Status: DC
  Administered 2014-04-03 – 2014-04-04 (×2): 20 mg via ORAL
  Filled 2014-04-03 (×3): qty 1

## 2014-04-03 NOTE — Progress Notes (Signed)
  Echocardiogram 2D Echocardiogram has been performed.  Diamond Nickel 04/03/2014, 1:33 PM

## 2014-04-03 NOTE — Progress Notes (Signed)
Pt HR SB between 49-52 tonight. Pt has 50mg  po flecainade due. Per pharmacy this medicine can decrease HR. MD on call notified and new order to hold medicine tonight. Pt on telemetry monitor currently. Will continue to monitor HR.

## 2014-04-03 NOTE — Discharge Instructions (Signed)

## 2014-04-03 NOTE — Telephone Encounter (Signed)
She has also been admitted yesterday

## 2014-04-03 NOTE — Progress Notes (Signed)
UR completed 

## 2014-04-03 NOTE — Discharge Summary (Signed)
Discharge Summary  [name] [id number] DOB: August 24, 1933  PCP: Redge Gainer, MD  Admit date: 04/02/2014 Discharge date: 04/03/2014  Time spent: 35 minutes  Recommendations for Outpatient Follow-up:  1. New medication: Lasix 40 mg by mouth daily 2. New Medication Lisinopril 5 mg daily 3. New Medication: K-Dur 20 meq daily 4.  Patient has a followup appointment with Dr. Meda Coffee her cardiologist next week and at that time repeat blood pressure and creatinine can be checked  Discharge Diagnoses:  Active Hospital Problems   Diagnosis Date Noted  . Hypertensive urgency 04/02/2014  . Acute diastolic heart failure 0000000  . Headache 04/02/2014  . CKD (chronic kidney disease) stage 3, GFR 30-59 ml/min 04/02/2014  . Paroxysmal atrial fibrillation 01/25/2014  . Hypokalemia 07/19/2013  . HYPOTHYROIDISM 12/18/2009  . HYPERLIPIDEMIA 12/18/2009    Resolved Hospital Problems   Diagnosis Date Noted Date Resolved  No resolved problems to display.    Discharge Condition: Improved, being discharged to home  Diet recommendation: Heart healthy  Filed Weights   04/02/14 1108 04/02/14 1715 04/03/14 0422  Weight: 62.596 kg (138 lb) 62.3 kg (137 lb 5.6 oz) 61.5 kg (135 lb 9.3 oz)    History of present illness:  80 year old white female with past medical history of hypertension that has been somewhat difficult to control presented to the emergency room after a week of elevated blood pressures and worsening headache to the point where patient felt quite ill. She was unable to keep her medications down the day prior. She came in the emergency room was noted to have systolic blood pressure in the 200s. After dose of hydralazine, her pressures initially improved but started to trend back up. Hospitalists were called for further evaluation  Hospital Course:  Principal Problem:   Hypertensive urgency: Patient was admitted and resumed on her home medications plus when necessary hydralazine.  TSH checked found to be normal. MRI checked looking for occult CVA and that was negative. BNP checked and was found to be elevated around 900. Patient given one dose of IV Lasix. Echocardiogram done noted grade 1 diastolic dysfunction. As of the patient's high blood pressure for likely from mild volume overload. She had been on Lasix previously but this was discontinued 2 months ago. At that time she was not diagnosed with congestive heart failure. Plan to discharge her home on Lasix plus potassium supplementation plus lisinopril for heart failure or measures  Active Problems:   HYPOTHYROIDISM: Stable medical issue. TSH checked and found to be normal. Continue on home dose of Synthroid.    HYPERLIPIDEMIA: Stable medical issue. Continued on home dose of statin.    Hypokalemia: Replaced. Will be discharged on potassium supplementation at that we've restarted diuretic.   Paroxysmal atrial fibrillation: Remained rate controlled. Patient not on anticoagulation.    Headache: With blood pressure control, headache much improved. CVA ruled out.   CKD (chronic kidney disease) stage 3, GFR 30-59 ml/min: At baseline. Creatinine will need to be observed in the setting of adding diuretic and ACE inhibitor    Acute diastolic heart failure: See above.   Procedures:  Echocardiogram done 0000000: Grade 1 diastolic dysfunction  Consultations:  None  Discharge Exam: BP 171/50  Pulse 55  Temp(Src) 97.7 F (36.5 C) (Oral)  Resp 18  Ht 5\' 5"  (1.651 m)  Wt 61.5 kg (135 lb 9.3 oz)  BMI 22.56 kg/m2  SpO2 98%  General: Alert and oriented x3, no acute distress Cardiovascular: Regular rate and rhythm, [id number], 2/6 systolic ejection  murmur Respiratory: Clear to auscultation bilaterally  Discharge Instructions You were cared for by a hospitalist during your hospital stay. If you have any questions about your discharge medications or the care you received while you were in the hospital after you are discharged,  you can call the unit and asked to speak with the hospitalist on call if the hospitalist that took care of you is not available. Once you are discharged, your primary care physician will handle any further medical issues. Please note that NO REFILLS for any discharge medications will be authorized once you are discharged, as it is imperative that you return to your primary care physician (or establish a relationship with a primary care physician if you do not have one) for your aftercare needs so that they can reassess your need for medications and monitor your lab values.  Discharge Instructions   Diet - low sodium heart healthy    Complete by:  As directed      Increase activity slowly    Complete by:  As directed             Medication List         aspirin EC 81 MG tablet  Take 81 mg by mouth every other day.     atenolol-chlorthalidone 50-25 MG per tablet  Commonly known as:  TENORETIC  Take 1 tablet by mouth daily.     atorvastatin 20 MG tablet  Commonly known as:  LIPITOR  Take 0.5 tablets (10 mg total) by mouth daily.     calcium-vitamin D 500-200 MG-UNIT per tablet  Commonly known as:  OSCAL WITH D  Take 1 tablet by mouth daily.     Fish Oil 1200 MG Caps  Take 1,200 mg by mouth daily.     flecainide 50 MG tablet  Commonly known as:  TAMBOCOR  Take 1 tablet (50 mg total) by mouth 2 (two) times daily.     furosemide 20 MG tablet  Commonly known as:  LASIX  Take 2 tablets (40 mg total) by mouth daily.     isosorbide mononitrate 60 MG 24 hr tablet  Commonly known as:  IMDUR  Take 1 tablet (60 mg total) by mouth daily.     levothyroxine 75 MCG tablet  Commonly known as:  SYNTHROID, LEVOTHROID  Take 1 tablet (75 mcg total) by mouth daily.     lisinopril 5 MG tablet  Commonly known as:  PRINIVIL,ZESTRIL  Take 1 tablet (5 mg total) by mouth daily.     potassium chloride SA 20 MEQ tablet  Commonly known as:  K-DUR,KLOR-CON  Take 1 tablet (20 mEq total) by mouth  daily.       Allergies  Allergen Reactions  . Clonidine Derivatives Nausea Only  . Terazosin Hives and Nausea Only  . Sulfonamide Derivatives Nausea Only and Rash       Follow-up Information   Follow up with Redge Gainer, MD In 1 month. (As needed)    Specialty:  Family Medicine   Contact information:   [place] [address] [phone]       Follow up with Dorothy Spark, MD. (As scheduled next week)    Specialty:  Cardiology   Contact information:   [address] [place]  [id number] [phone]        The results of significant diagnostics from this hospitalization (including imaging, microbiology, ancillary and laboratory) are listed below for reference.    Significant Diagnostic  Studies: Ct Head Wo Contrast  04/02/2014     IMPRESSION: No acute intracranial abnormalities.   Electronically Signed   By: Lavonia Dana M.D.   On: 04/02/2014 12:18   Mr Brain Wo Contrast  04/03/2014   .  IMPRESSION: 1.  No acute intracranial abnormality. 2. Mild to moderate for age nonspecific signal changes in the brain, most commonly due to chronic small vessel disease.   Electronically Signed   By: Lars Pinks M.D.   On: 04/03/2014 14:38    Microbiology: No results found for this or any previous visit (from the past 240 hour(s)).   Labs: Basic Metabolic Panel:  Recent Labs Lab 04/02/14 1130 04/03/14 0523  NA 141 138  K 3.3* 3.2*  CL 97 97  CO2 31 31  GLUCOSE 136* 110*  BUN 22 22  CREATININE 1.29* 1.45*  CALCIUM 9.9 9.3   CBC:  Recent Labs Lab 04/02/14 1130 04/03/14 0523  WBC 5.7 4.7  NEUTROABS 4.7  --   HGB 13.3 11.2*  HCT 39.1 34.3*  MCV 84.6 84.7  PLT 125* 113*   Cardiac Enzymes:  Recent Labs Lab 04/02/14 1825 04/02/14 2310 04/03/14 0523  TROPONINI <0.30 <0.30 <0.30   BNP: BNP (last 3 results)  Recent Labs  06/30/13 0950 04/02/14 1825  PROBNP 132.0* 839.1*   CBG: No results found for this basename:  GLUCAP,  in the last 168 hours     Signed:  Annita Brod  [facility] 04/03/2014, 5:20 PM

## 2014-04-04 DIAGNOSIS — I5031 Acute diastolic (congestive) heart failure: Secondary | ICD-10-CM

## 2014-04-04 LAB — BASIC METABOLIC PANEL
ANION GAP: 13 (ref 5–15)
BUN: 26 mg/dL — ABNORMAL HIGH (ref 6–23)
CHLORIDE: 97 meq/L (ref 96–112)
CO2: 31 mEq/L (ref 19–32)
CREATININE: 1.56 mg/dL — AB (ref 0.50–1.10)
Calcium: 9.6 mg/dL (ref 8.4–10.5)
GFR, EST AFRICAN AMERICAN: 36 mL/min — AB (ref >90)
GFR, EST NON AFRICAN AMERICAN: 31 mL/min — AB (ref >90)
Glucose, Bld: 116 mg/dL — ABNORMAL HIGH (ref 70–99)
Potassium: 3.3 mEq/L — ABNORMAL LOW (ref 3.7–5.3)
Sodium: 141 mEq/L (ref 137–147)

## 2014-04-04 MED ORDER — POTASSIUM CHLORIDE CRYS ER 20 MEQ PO TBCR: 20.0000 meq | EXTENDED_RELEASE_TABLET | Freq: Every day | ORAL | Status: DC

## 2014-04-04 MED ORDER — LORAZEPAM 0.5 MG PO TABS
0.5000 mg | ORAL_TABLET | Freq: Once | ORAL | Status: AC
  Administered 2014-04-04: 0.5 mg via ORAL
  Filled 2014-04-04 (×2): qty 1

## 2014-04-04 MED ORDER — ACETAMINOPHEN 325 MG PO TABS
650.0000 mg | ORAL_TABLET | Freq: Once | ORAL | Status: AC
  Administered 2014-04-04: 650 mg via ORAL
  Filled 2014-04-04: qty 2

## 2014-04-04 MED ORDER — HYDRALAZINE HCL 25 MG PO TABS: 25.0000 mg | ORAL_TABLET | Freq: Three times a day (TID) | ORAL | Status: DC

## 2014-04-04 MED ORDER — LORAZEPAM 0.5 MG PO TABS: 0.5000 mg | ORAL_TABLET | Freq: Two times a day (BID) | ORAL | Status: DC | PRN

## 2014-04-04 MED ORDER — LISINOPRIL 5 MG PO TABS: 5.0000 mg | ORAL_TABLET | Freq: Every day | ORAL | Status: DC

## 2014-04-04 MED ORDER — FUROSEMIDE 20 MG PO TABS: 20.0000 mg | ORAL_TABLET | Freq: Every day | ORAL | Status: DC

## 2014-04-04 NOTE — Progress Notes (Signed)
Pt stated that she gets anxious when RN takes her BP. Pt instructed on relaxation techniques such as deep breathing. Will continue to monitor. Ronnette Hila, RN

## 2014-04-04 NOTE — Progress Notes (Signed)
[facility] PROGRESS NOTE  [name] [id number] DOB: [birth year] DOA: 04/02/2014 PCP: Redge Gainer, MD  Assessment/Plan: Pls see details in the d/c summary   Hypertensive urgency: BP improved on addition of lisinopril, diuretics; added hydralazine 25 TID:  - I have recommended to f/u with PCP next week to recheck renal function and adjust medications as needed  Active Problems:  HYPOTHYROIDISM: Stable medical issue. TSH checked and found to be normal. Continue on home dose of Synthroid.  HYPERLIPIDEMIA: Stable medical issue. Continued on home dose of statin.  Hypokalemia: Replaced. Will be discharged on potassium supplementation at that we've restarted diuretic. Recheck renal function next week  Paroxysmal atrial fibrillation: Remained rate controlled. Patient not on anticoagulation. Patient plans to discuss further about anticoagulation with her cardiologist  Headache: With blood pressure control, headache much improved. CVA ruled out. Resolved  CKD (chronic kidney disease) stage 3, GFR 30-59 ml/min: At baseline. Creatinine will need to be observed in the setting of adding diuretic and ACE inhibitor; f/u next week  Acute diastolic heart failure: See above. clinically euvolemic; f/u next week    Code Status: full Family Communication:  D/w patient  (indicate person spoken with, relationship, and if by phone, the number) Disposition Plan: home    Consultants:  None   Procedures:  none  Antibiotics:  none (indicate start date, and stop date if known)  HPI/Subjective: alert  Objective: Filed Vitals:   04/04/14 0638  BP:   Pulse:   Temp: 97.5 F (36.4 C)  Resp:     Intake/Output Summary (Last 24 hours) at 04/04/14 0832 Last data filed at 04/04/14 0819  Gross per 24 hour  Intake   1300 ml  Output   3200 ml  Net  -1900 ml   Filed Weights   04/02/14 1715 04/03/14 0422 04/04/14 0638  Weight: 62.3 kg (137 lb 5.6 oz) 61.5 kg (135 lb 9.3 oz) 60.918 kg  (134 lb 4.8 oz)    Exam:   General:  alert  Cardiovascular: s1,s2 rrr  Respiratory: CTA BL  Abdomen: soft, nt,nd   Musculoskeletal: no LE edema   Data Reviewed: Basic Metabolic Panel:  Recent Labs Lab 04/02/14 1130 04/03/14 0523 04/04/14 0436  NA 141 138 141  K 3.3* 3.2* 3.3*  CL 97 97 97  CO2 31 31 31   GLUCOSE 136* 110* 116*  BUN 22 22 26*  CREATININE 1.29* 1.45* 1.56*  CALCIUM 9.9 9.3 9.6   Liver Function Tests: No results found for this basename: AST, ALT, ALKPHOS, BILITOT, PROT, ALBUMIN,  in the last 168 hours No results found for this basename: LIPASE, AMYLASE,  in the last 168 hours No results found for this basename: AMMONIA,  in the last 168 hours CBC:  Recent Labs Lab 04/02/14 1130 04/03/14 0523  WBC 5.7 4.7  NEUTROABS 4.7  --   HGB 13.3 11.2*  HCT 39.1 34.3*  MCV 84.6 84.7  PLT 125* 113*   Cardiac Enzymes:  Recent Labs Lab 04/02/14 1825 04/02/14 2310 04/03/14 0523  TROPONINI <0.30 <0.30 <0.30   BNP (last 3 results)  Recent Labs  06/30/13 0950 04/02/14 1825  PROBNP 132.0* 839.1*   CBG: No results found for this basename: GLUCAP,  in the last 168 hours  No results found for this or any previous visit (from the past 240 hour(s)).   Studies: Ct Head Wo Contrast  04/02/2014   CLINICAL DATA:  Severe headache, vomiting, high blood pressure, history hypertension, renal insufficiency  EXAM: CT HEAD  WITHOUT CONTRAST  TECHNIQUE: Contiguous axial images were obtained from the base of the skull through the vertex without intravenous contrast.  COMPARISON:  11/28/2007  FINDINGS: Minimal atrophy.  Normal ventricular morphology.  No midline shift or mass effect.  Otherwise normal appearance of brain parenchyma.  No intracranial hemorrhage, mass lesion, or evidence acute infarction.  No extra-axial fluid collections.  Bones and sinuses unremarkable.  IMPRESSION: No acute intracranial abnormalities.   Electronically Signed   By: Lavonia Dana M.D.   On:  04/02/2014 12:18   Mr Brain Wo Contrast  04/03/2014   CLINICAL DATA:  80 year old female with headache and hypertension. Initial encounter.  EXAM: MRI HEAD WITHOUT CONTRAST  TECHNIQUE: Multiplanar, multiecho pulse sequences of the brain and surrounding structures were obtained without intravenous contrast.  COMPARISON:  Head CTs without contrast 04/02/2014 and earlier.  FINDINGS: Cerebral volume is within normal limits for age. No restricted diffusion to suggest acute infarction. No midline shift, mass effect, evidence of mass lesion, ventriculomegaly, extra-axial collection or acute intracranial hemorrhage. Cervicomedullary junction and pituitary are within normal limits. Major intracranial vascular flow voids are preserved, with dominant appearing distal left vertebral artery.  Mild to moderate for age scattered cerebral white matter T2 and FLAIR hyperintensity. The pattern is nonspecific. Occasional chronic micro hemorrhages. Similar T2 and FLAIR hyperintensity in the pons. No cortical encephalomalacia identified. Deep gray matter nuclei and cerebellum are within normal limits for age.  Visible internal auditory structures appear normal. Mastoids are clear. Degenerative appearing joint effusion at the right occipital condyles - C1 articulation. Otherwise negative for age visible cervical spine. Paranasal sinuses are clear. Visualized orbit soft tissues are within normal limits. Visualized scalp soft tissues are within normal limits. Visualized bone marrow signal is within normal limits.  IMPRESSION: 1.  No acute intracranial abnormality. 2. Mild to moderate for age nonspecific signal changes in the brain, most commonly due to chronic small vessel disease.   Electronically Signed   By: Lars Pinks M.D.   On: 04/03/2014 14:38    Scheduled Meds: . aspirin EC  81 mg Oral QODAY  . atenolol  50 mg Oral Daily   And  . chlorthalidone  25 mg Oral Daily  . calcium-vitamin D  1 tablet Oral Daily  . flecainide  50  mg Oral BID  . furosemide  20 mg Oral Daily  . isosorbide mononitrate  60 mg Oral Daily  . levothyroxine  75 mcg Oral QAC breakfast  . lisinopril  5 mg Oral Daily  . LORazepam  0.5 mg Oral Once   Continuous Infusions:   Principal Problem:   Hypertensive urgency Active Problems:   HYPOTHYROIDISM   HYPERLIPIDEMIA   Hypokalemia   Paroxysmal atrial fibrillation   Headache   CKD (chronic kidney disease) stage 3, GFR 30-59 ml/min   Acute diastolic heart failure    Time spent: >35 minutes     Kinnie Feil  [facility] Pager [phone]. If 7PM-7AM, please contact night-coverage at [email or web address], password [facility] 04/04/2014, 8:32 AM  LOS: 2 days

## 2014-04-04 NOTE — Progress Notes (Signed)
Pt complain of headache, MD on call notified and new order for tylenol once. Once given tylenol, pt states she feels anxious and would like something for anxiety, pt BP 166/80, not time for PRN hydralazine yet. MD on call paged.

## 2014-04-04 NOTE — Progress Notes (Signed)
Patient discharged home with husband. Medications reviewed with pt. Verbalizes understanding.

## 2014-04-05 NOTE — Telephone Encounter (Signed)
Pt calling to go over her current medications prescribed and the new ones since being discharged from the hospital.  Rmani Kapusta Majestic over current meds with pt and reassured her on how to take them and when to take them.  Advised pt to continue monitoring her BP at the same time every day and to weigh herself daily and log both of these values, and bring them with her to her OV with Dr Meda Coffee on 8/24.  Advised pt to continue with her low sodium diet and drink healthy amount of h20 daily.  Advised pt to take all meds prescribed.  Advised pt to monitor any LEE, and report this.  Informed pt that if she starts experiencing HA, visual disturbances, increased BP, sob, cp, palpitations, feeling faint or dizzy, any syncopal episodes, or becomes acutely distressed, then she should call 911 and go to the ED.  Advised pt to follow all discharge instructions given by the hospital.  Pt verbalized understanding of all instructions given and pleased with all the assistance provided.

## 2014-04-05 NOTE — Telephone Encounter (Signed)
New message    Patient recent discharge from hospital . Has some questions regarding medication.

## 2014-04-10 ENCOUNTER — Ambulatory Visit (INDEPENDENT_AMBULATORY_CARE_PROVIDER_SITE_OTHER): Payer: Medicare Other | Admitting: Cardiology

## 2014-04-10 VITALS — BP 170/78 | HR 56 | Ht 65.5 in | Wt 139.0 lb

## 2014-04-10 DIAGNOSIS — Z79899 Other long term (current) drug therapy: Secondary | ICD-10-CM

## 2014-04-10 LAB — BASIC METABOLIC PANEL
BUN: 23 mg/dL (ref 6–23)
CO2: 36 mEq/L — ABNORMAL HIGH (ref 19–32)
Calcium: 9.8 mg/dL (ref 8.4–10.5)
Chloride: 95 mEq/L — ABNORMAL LOW (ref 96–112)
Creatinine, Ser: 1.6 mg/dL — ABNORMAL HIGH (ref 0.4–1.2)
GFR: 34.35 mL/min — ABNORMAL LOW (ref >60.00)
Glucose, Bld: 99 mg/dL (ref 70–99)
Potassium: 4.6 mEq/L (ref 3.5–5.1)
Sodium: 137 mEq/L (ref 135–145)

## 2014-04-10 MED ORDER — ALPRAZOLAM 0.5 MG PO TABS: 0.5000 mg | ORAL_TABLET | Freq: Every evening | ORAL | Status: DC | PRN

## 2014-04-10 MED ORDER — HYDRALAZINE HCL 25 MG PO TABS: 50.0000 mg | ORAL_TABLET | Freq: Two times a day (BID) | ORAL | Status: DC

## 2014-04-10 NOTE — Patient Instructions (Addendum)
Your physician has recommended you make the following change in your medication:   1. Take Hydralazine 25 mg 2 tablets twice a day.  2. Start Xanax 0.5 mg as needed.   Your physician recommends that you return for lab work today for bmet.  Your physician recommends that you schedule a follow-up appointment in: 1 month with Dr. Meda Coffee.

## 2014-04-10 NOTE — Addendum Note (Signed)
Addended by: Avie Echevaria on: 04/10/2014 01:14 PM   Modules accepted: Orders

## 2014-04-10 NOTE — Progress Notes (Signed)
Patient ID: [name], female   DOB: December 08, 1933, 80 y.o.   MRN: [id number]    Patient Care Team: Chipper Herb, MD as PCP - General (Family Medicine)  HPI  [name] is a 80 y.o. female  80 year old female with HTN -difficult to control with chronic diastolic CHF, and arrhythmias - seen by Dr Caryl Comes - left atrial tachycardias, that resolved on Flecainide.  She has had normal stress test and worsening LVH - now moderate with preserved LVEF and elevated filling pressures. She was recently admitted to the hospital for hypertensive urgency. Today she states that she is feeling well other than headaches. She feels anxious about her hypertension.   She denies CP, LE edema, palpitations or syncope, stable weight 137 lbs, no orthopnea or PND.   Past Medical History  Diagnosis Date  . Nodule of right lung     Right upper lobe  . Hyperlipidemia   . Hypertension   . Hypothyroidism   . Renal insufficiency     Chronic  . Arthritis     Past Surgical History  Procedure Laterality Date  . Thyroidectomy, partial      Current Outpatient Prescriptions  Medication Sig Dispense Refill  . aspirin EC 81 MG tablet Take 81 mg by mouth every other day.      Marland Kitchen atenolol-chlorthalidone (TENORETIC) 50-25 MG per tablet Take 1 tablet by mouth daily.  90 tablet  3  . atorvastatin (LIPITOR) 20 MG tablet Take 0.5 tablets (10 mg total) by mouth daily.  30 tablet  4  . calcium-vitamin D (OSCAL WITH D) 500-200 MG-UNIT per tablet Take 1 tablet by mouth daily.        . flecainide (TAMBOCOR) 50 MG tablet Take 1 tablet (50 mg total) by mouth 2 (two) times daily.  180 tablet  3  . furosemide (LASIX) 20 MG tablet Take 1 tablet (20 mg total) by mouth daily.  30 tablet  0  . hydrALAZINE (APRESOLINE) 25 MG tablet Take 1 tablet (25 mg total) by mouth 3 (three) times daily.  90 tablet  0  . isosorbide mononitrate (IMDUR) 60 MG 24 hr tablet Take 1 tablet (60 mg total) by mouth daily.  90 tablet  3  .  levothyroxine (SYNTHROID, LEVOTHROID) 75 MCG tablet Take 1 tablet (75 mcg total) by mouth daily.  90 tablet  3  . lisinopril (PRINIVIL,ZESTRIL) 5 MG tablet Take 1 tablet (5 mg total) by mouth daily.  30 tablet  0  . LORazepam (ATIVAN) 0.5 MG tablet Take 1 tablet (0.5 mg total) by mouth every 12 (twelve) hours as needed for anxiety.  10 tablet  0  . Omega-3 Fatty Acids (FISH OIL) 1200 MG CAPS Take 1,200 mg by mouth daily.      . potassium chloride SA (K-DUR,KLOR-CON) 20 MEQ tablet Take 1 tablet (20 mEq total) by mouth daily.  20 tablet  0   No current facility-administered medications for this visit.    Allergies  Allergen Reactions  . Clonidine Derivatives Nausea Only  . Terazosin Hives and Nausea Only  . Sulfonamide Derivatives Nausea Only and Rash    Review of Systems negative except from HPI and PMH  Physical Exam BP 170/78  Pulse 56  Ht 5' 5.5" (1.664 m)  Wt 139 lb (63.05 kg)  BMI 22.77 kg/m2 Well developed and well nourished in no acute distress HENT normal E scleral and icterus clear Neck Supple JVP flat; carotids brisk and full Clear to ausculation  bRegular rate and rhythm, no murmurs gallops or rub Soft with active bowel sounds No clubbing cyanosis  Edema Alert and oriented, grossly normal motor and sensory function Skin Warm and Dry  ECG 6/22 demonstrated sinus rhythm at 69 intervals 18/09/40 Axis is 85  ECG from 5/28 demonstrated the mechanism of her tachycardia. She has a left atrial tachycardia identified by P-wave inversion in lead V1.   Tracing 16:10:47 demonstrates one-to-one conduction tracing 16:11:33 and a straight intermittent atrial tachycardia  ECHO 04/03/2014  - Left ventricle: The cavity size was normal. There was moderate concentric hypertrophy. Systolic function was vigorous. The estimated ejection fraction was in the range of 65% to 70%. Wall motion was normal; there were no regional wall motion abnormalities. Doppler parameters are consistent  with abnormal left ventricular relaxation (grade 1 diastolic dysfunction). The E/e&' ratio is between 8-15, suggesting indeterminate LV Filling pressure. - Aortic valve: Structurally normal valve. Trileaflet. Transvalvular velocity was minimally increased, most of which is due to intracavitary gradient. There was no stenosis. There was no regurgitation. - Left atrium: The atrium was at the upper limits of normal in size. - Tricuspid valve: TR jet not adequate to measure RVSP.   Nuclear stress test 02/23/2014  - Left ventricle: The cavity size was normal. There was moderate concentric hypertrophy. Systolic function was vigorous. The estimated ejection fraction was in the range of 65% to 70%. Wall motion was normal; there were no regional wall motion abnormalities. Doppler parameters are consistent with abnormal left ventricular relaxation (grade 1 diastolic dysfunction). The E/e&' ratio is between 8-15, suggesting indeterminate LV Filling pressure. - Aortic valve: Structurally normal valve. Trileaflet. Transvalvular velocity was minimally increased, most of which is due to intracavitary gradient. There was no stenosis. There was no regurgitation. - Left atrium: The atrium was at the upper limits of normal in size. - Tricuspid valve: TR jet not adequate to measure RVSP.    Assessment and  Plan  Atrial tachycardia Sinus bradycardia Hypertension HFpEF  1. HFpEF - moderate and worsening LVH on the most recent echo,  Euvolemic, continue lasix 20 mg po daily, we will check BMP today and adjsut KCl  2. Left atrial tachycardia - no evidence of atrial fibrillation. Resolved on Flecainide.  3. HTN - increase Hydralazine to 50 mg PO BID (from 25 mg po TID), continue imdur 60 mg po daily, hope the headaches resolves No room with BB- bradycardia, amlodipine gave her significant LE edema  Follow up in 4-6 weeks for BP check   Dorothy Spark 04/10/2014

## 2014-04-11 ENCOUNTER — Other Ambulatory Visit: Payer: Self-pay

## 2014-04-11 MED ORDER — POTASSIUM CHLORIDE CRYS ER 20 MEQ PO TBCR: 20.0000 meq | EXTENDED_RELEASE_TABLET | Freq: Every day | ORAL | Status: DC

## 2014-04-11 MED ORDER — FUROSEMIDE 20 MG PO TABS: 20.0000 mg | ORAL_TABLET | Freq: Every day | ORAL | Status: DC

## 2014-04-11 MED ORDER — FUROSEMIDE 20 MG PO TABS: ORAL_TABLET | ORAL | Status: DC

## 2014-04-11 MED ORDER — POTASSIUM CHLORIDE CRYS ER 20 MEQ PO TBCR: EXTENDED_RELEASE_TABLET | ORAL | Status: DC

## 2014-04-16 ENCOUNTER — Encounter (HOSPITAL_COMMUNITY): Payer: Self-pay | Admitting: Emergency Medicine

## 2014-04-16 ENCOUNTER — Emergency Department (HOSPITAL_COMMUNITY)
Admission: EM | Admit: 2014-04-16 | Discharge: 2014-04-16 | Disposition: A | Discharge status: Home / Self Care | Payer: Medicare Other | Attending: Emergency Medicine | Admitting: Emergency Medicine

## 2014-04-16 DIAGNOSIS — E039 Hypothyroidism, unspecified: Secondary | ICD-10-CM | POA: Insufficient documentation

## 2014-04-16 DIAGNOSIS — E785 Hyperlipidemia, unspecified: Secondary | ICD-10-CM | POA: Insufficient documentation

## 2014-04-16 DIAGNOSIS — Z79899 Other long term (current) drug therapy: Secondary | ICD-10-CM | POA: Insufficient documentation

## 2014-04-16 DIAGNOSIS — I129 Hypertensive chronic kidney disease with stage 1 through stage 4 chronic kidney disease, or unspecified chronic kidney disease: Secondary | ICD-10-CM | POA: Diagnosis not present

## 2014-04-16 DIAGNOSIS — I158 Other secondary hypertension: Secondary | ICD-10-CM | POA: Insufficient documentation

## 2014-04-16 DIAGNOSIS — N189 Chronic kidney disease, unspecified: Secondary | ICD-10-CM | POA: Insufficient documentation

## 2014-04-16 DIAGNOSIS — I159 Secondary hypertension, unspecified: Secondary | ICD-10-CM

## 2014-04-16 DIAGNOSIS — R51 Headache: Secondary | ICD-10-CM | POA: Diagnosis not present

## 2014-04-16 DIAGNOSIS — R519 Headache, unspecified: Secondary | ICD-10-CM

## 2014-04-16 DIAGNOSIS — M129 Arthropathy, unspecified: Secondary | ICD-10-CM | POA: Diagnosis not present

## 2014-04-16 LAB — BASIC METABOLIC PANEL
Anion gap: 14 (ref 5–15)
BUN: 18 mg/dL (ref 6–23)
CO2: 28 meq/L (ref 19–32)
Calcium: 9.2 mg/dL (ref 8.4–10.5)
Chloride: 96 mEq/L (ref 96–112)
Creatinine, Ser: 1.47 mg/dL — ABNORMAL HIGH (ref 0.50–1.10)
GFR calc Af Amer: 38 mL/min — ABNORMAL LOW (ref >90)
GFR calc non Af Amer: 33 mL/min — ABNORMAL LOW (ref >90)
GLUCOSE: 104 mg/dL — AB (ref 70–99)
Potassium: 3.6 mEq/L — ABNORMAL LOW (ref 3.7–5.3)
Sodium: 138 mEq/L (ref 137–147)

## 2014-04-16 LAB — CBC WITH DIFFERENTIAL/PLATELET
Basophils Absolute: 0 10*3/uL (ref 0.0–0.1)
Basophils Relative: 0 % (ref 0–1)
Eosinophils Absolute: 0 10*3/uL (ref 0.0–0.7)
Eosinophils Relative: 1 % (ref 0–5)
HCT: 35.9 % — ABNORMAL LOW (ref 36.0–46.0)
Hemoglobin: 12.2 g/dL (ref 12.0–15.0)
LYMPHS ABS: 0.9 10*3/uL (ref 0.7–4.0)
LYMPHS PCT: 23 % (ref 12–46)
MCH: 28.3 pg (ref 26.0–34.0)
MCHC: 34 g/dL (ref 30.0–36.0)
MCV: 83.3 fL (ref 78.0–100.0)
Monocytes Absolute: 0.4 10*3/uL (ref 0.1–1.0)
Monocytes Relative: 10 % (ref 3–12)
NEUTROS ABS: 2.5 10*3/uL (ref 1.7–7.7)
NEUTROS PCT: 66 % (ref 43–77)
PLATELETS: 147 10*3/uL — AB (ref 150–400)
RBC: 4.31 MIL/uL (ref 3.87–5.11)
RDW: 13.8 % (ref 11.5–15.5)
WBC: 3.8 10*3/uL — AB (ref 4.0–10.5)

## 2014-04-16 LAB — I-STAT TROPONIN, ED: TROPONIN I, POC: 0 ng/mL (ref 0.00–0.08)

## 2014-04-16 MED ORDER — BUPIVACAINE HCL (PF) 0.5 % IJ SOLN
5.0000 mL | Freq: Once | INTRAMUSCULAR | Status: AC
  Administered 2014-04-16: 5 mL
  Filled 2014-04-16: qty 10

## 2014-04-16 NOTE — ED Notes (Signed)
Pt sts that she continues to have hypertension and HA since being released from the hospital. sts that she took extra BP meds this am an instructed and a pill for anxiety.

## 2014-04-16 NOTE — ED Provider Notes (Signed)
Medical screening examination/treatment/procedure(s) were conducted as a shared visit with non-physician practitioner(s) and myself.  I personally evaluated the patient during the encounter.   EKG Interpretation   Date/Time:  "Sunday April 16 2014 17:34:11 EDT Ventricular Rate:  67 PR Interval:  215 QRS Duration: 105 QT Interval:  457 QTC Calculation: 482 R Axis:   76 Text Interpretation:  Sinus rhythm Borderline prolonged PR interval RSR'  in V1 or V2, right VCD or RVH has not changed Confirmed by Destenie Ingber  MD,  Charletha Dalpe ([id number]) on 04/16/2014 5:36:25 PM     80" yf with HA and HTN. I do not think they are related. Provided list of BP measurements since hospital discharge. Several days well controlled and continued symptoms. W/u during hospital unremarkable with regards to this. Numerous other vague complaints. "Foginess" "Not myself" which she attributes to Imdur. As a result did not take it yesterday or today. Probably why BP back up again today. Tried numerous times to reassure her but she perseverates on her BP and convinced causing her HA and other symptoms or that BP meds are. Discussed that can try alternatives but this is most appropriately done by either her PCP or cardiologist as I have no way of following up on her. I have a very low suspicion for emergent cause of her HA such as bleed, infectious or mass but doubt. There is no history of trauma. Pt has a nonfocal neurological exam. Afebrile and neck supple. No use of blood thinning medication aside from 81 mg asa. Consider ocular etiology such as acute angle closure glaucoma but doubt. Pt denies acute change in visual acuity and eye exam unremarkable. Doubt temporal arteritis given age,but no temporal tenderness, no ocular complaints and HA is in occipital region/upper neck.  Doubt CO poisoning. No contacts with similar symptoms. Doubt venous thrombosis. Doubt carotid or vertebral arteries dissection. Pt offered cervical trigger point injection  to see if it might potentially help which she was agreeable to.    Procedure Note: Trigger Point Injection (2 point) - Bilateral Lower Paracervical Muscular Injection  Indication: Headache  Consent: Informed verbal consent obtained. Detailed procedure including risks/benefits including but not limited to:   Soreness.  Bruising.  Stiffness.  More serious problems are rare. But, they may include:  Bleeding under the skin (hematoma).  Skin infection.  Breaking off of the needle under your skin.  Lung puncture.  The trigger point injection may not work for you.  Contraindications: none noted  Positioning: seated upright  Procedure: Area was cleaned with alcohol. B/l paracervical musculature at C 6/7 level was injected using C7 spinous process as landmark.  3cc total divided equally on either side of 0.5% bupavicaine without epinephrine injected with 1.5 inch 25 g needle while using distracting digital pressure. Needle directed slightly cephalad from parallel to floor. Aspirated prior to injection.   Pt tolerated well with no apparent immediate complication.   Virgel Manifold, MD 04/18/14 [phone]

## 2014-04-16 NOTE — ED Provider Notes (Signed)
CSN: [id number]     Arrival date & time 04/16/14  1400 History   First MD Initiated Contact with Patient 04/16/14 1708     Chief Complaint  Patient presents with  . Hypertension  . Headache     (Consider location/radiation/quality/duration/timing/severity/associated sxs/prior Treatment) HPI [name] is a 80 y.o. female who presents to emergency department complaining of a headache and elevated blood pressure. Patient with recent admission for hypertensive urgency, discharge from the hospital 12 days ago. High blood pressure medications were adjusted at that time. In addition to her already taking lisinopril, Imdur, atenolol, hydralazine was added as well as Lasix. Patient states that since her discharge, her blood pressures at home were okay, however she has had persistent headache daily, with "not feeling like myself, cloud headed." Patient states that she wakes up symptom free, however her symptoms began shortly after taking her medications. She recently saw primary care doctor in the office, 4 days ago, who seems to think that her headache will improve as she adjusts to her medications. At that time her hydralazine was increased from 25 mg 3 times a day to 50 mg twice a day. Patient believes that it is her imdur that  is causing her symptoms. She states she did not take her dose yesterday or today. Today she states that she took hydralazine twice instead. She took 100 mg this morning. She states that since she did not take him here yesterday or today she is feeling better. Today however she developed a headache again, and she took her blood pressure, with a systolic reading in [id number]. Patient decided to come here. Patient denies any sudden onset of headache. Denies any visual changes. No nausea or vomiting. No weakness in extremities. No chest pain or shortness of breath.   Past Medical History  Diagnosis Date  . Nodule of right lung     Right upper lobe  . Hyperlipidemia   . Hypertension    . Hypothyroidism   . Renal insufficiency     Chronic  . Arthritis    Past Surgical History  Procedure Laterality Date  . Thyroidectomy, partial     Family History  Problem Relation Age of Onset  . Thyroid disease Mother   . Stroke Father    History  Substance Use Topics  . Smoking status: Never Smoker   . Smokeless tobacco: Not on file     Comment: Pt denies cigarettes  . Alcohol Use: No   OB History   Grav Para Term Preterm Abortions TAB SAB Ect Mult Living                 Review of Systems  Constitutional: Negative for fever and chills.  Respiratory: Negative for cough, chest tightness and shortness of breath.   Cardiovascular: Negative for chest pain, palpitations and leg swelling.  Gastrointestinal: Negative for nausea, vomiting, abdominal pain and diarrhea.  Genitourinary: Negative for dysuria, flank pain, vaginal bleeding, vaginal discharge, vaginal pain and pelvic pain.  Musculoskeletal: Negative for arthralgias, myalgias, neck pain and neck stiffness.  Skin: Negative for rash.  Neurological: Positive for dizziness, light-headedness and headaches. Negative for weakness.  All other systems reviewed and are negative.     Allergies  Clonidine derivatives; Terazosin; and Sulfonamide derivatives  Home Medications   Prior to Admission medications   Medication Sig Start Date End Date Taking? Authorizing Provider  ALPRAZolam Duanne Moron) 0.5 MG tablet Take 1 tablet (0.5 mg total) by mouth at bedtime as needed for anxiety.  04/10/14  Yes Dorothy Spark, MD  aspirin EC 81 MG tablet Take 81 mg by mouth every other day.   Yes Historical Provider, MD  atenolol-chlorthalidone (TENORETIC) 50-25 MG per tablet Take 1 tablet by mouth daily. 07/19/13  Yes Vernie Shanks, MD  atorvastatin (LIPITOR) 20 MG tablet Take 0.5 tablets (10 mg total) by mouth daily. 01/10/14  Yes Chipper Herb, MD  calcium-vitamin D (OSCAL WITH D) 500-200 MG-UNIT per tablet Take 1 tablet by mouth daily.      Yes Historical Provider, MD  flecainide (TAMBOCOR) 50 MG tablet Take 1 tablet (50 mg total) by mouth 2 (two) times daily. 02/09/14  Yes Deboraha Sprang, MD  hydrALAZINE (APRESOLINE) 25 MG tablet Take 2 tablets (50 mg total) by mouth 2 (two) times daily. 04/10/14  Yes Dorothy Spark, MD  isosorbide mononitrate (IMDUR) 60 MG 24 hr tablet Take 1 tablet (60 mg total) by mouth daily. 01/24/14  Yes Dorothy Spark, MD  levothyroxine (SYNTHROID, LEVOTHROID) 75 MCG tablet Take 1 tablet (75 mcg total) by mouth daily. 07/19/13  Yes Vernie Shanks, MD  lisinopril (PRINIVIL,ZESTRIL) 5 MG tablet Take 1 tablet (5 mg total) by mouth daily. 04/04/14  Yes Kinnie Feil, MD  LORazepam (ATIVAN) 0.5 MG tablet Take 1 tablet (0.5 mg total) by mouth every 12 (twelve) hours as needed for anxiety. 04/04/14  Yes Kinnie Feil, MD  Omega-3 Fatty Acids (FISH OIL) 1200 MG CAPS Take 1,200 mg by mouth daily.   Yes Historical Provider, MD   BP 191/66  Pulse 70  Temp(Src) 98.3 F (36.8 C)  Resp 22  SpO2 98% Physical Exam  Nursing note and vitals reviewed. Constitutional: She is oriented to person, place, and time. She appears well-developed and well-nourished. No distress.  HENT:  Head: Normocephalic.  Eyes: Conjunctivae and EOM are normal. Pupils are equal, round, and reactive to light.  Neck: Normal range of motion. Neck supple.  Cardiovascular: Normal rate, regular rhythm and normal heart sounds.   Pulmonary/Chest: Effort normal and breath sounds normal. No respiratory distress. She has no wheezes. She has no rales.  Abdominal: Soft. Bowel sounds are normal. She exhibits no distension. There is no tenderness. There is no rebound.  Musculoskeletal: She exhibits no edema.  Neurological: She is alert and oriented to person, place, and time. No cranial nerve deficit. Coordination normal.  5/5 and equal upper and lower extremity strength bilaterally. Equal grip strength bilaterally. Normal finger to nose and heel to  shin. No pronator drift.  Skin: Skin is warm and dry.  Psychiatric: She has a normal mood and affect. Her behavior is normal.    ED Course  Procedures (including critical care time) Labs Review Labs Reviewed  CBC WITH DIFFERENTIAL - Abnormal; Notable for the following:    WBC 3.8 (*)    HCT 35.9 (*)    Platelets 147 (*)    All other components within normal limits  BASIC METABOLIC PANEL - Abnormal; Notable for the following:    Potassium 3.6 (*)    Glucose, Bld 104 (*)    Creatinine, Ser 1.47 (*)    GFR calc non Af Amer 33 (*)    GFR calc Af Amer 38 (*)    All other components within normal limits  I-STAT TROPOININ, ED    Imaging Review No results found.   EKG Interpretation None      MDM   Final diagnoses:  Secondary hypertension, unspecified  Nonintractable headache, unspecified chronicity  pattern, unspecified headache type    Pt with headache and persistent htn. No head injury. No sudden onset of headache. Pt did not take her imdur yesterday or today, suspect prob why bp is up today. Pt brought log of her BP at home, and it has been between [id number] systolic. Pt appears very anxious. BP is elevated here. Will check labs. Discussed with Dr. Wilson Singer who has seen pt as well, agrees HA most likely benign. He will treat with trigger pt injections.    Labs at baseline. Pt will need close follow up with pcp for further BP management. Encouraged to take all of her meds as prescribed. D/c home in stable condition.   Filed Vitals:   04/16/14 1800 04/16/14 1830 04/16/14 1900 04/16/14 1945  BP: 190/61 199/63 175/53 183/65  Pulse: 65 67 66 78  Temp:      Resp: 15 16 15 20   SpO2: 95% 95% 93% 98%       Renold Genta, PA-C 04/16/14 2232

## 2014-04-16 NOTE — Discharge Instructions (Signed)
Please follow up with your doctor regarding your blood pressure. For now continue all your medications. Tylenol for headache.   Hypertension Hypertension, commonly called high blood pressure, is when the force of blood pumping through your arteries is too strong. Your arteries are the blood vessels that carry blood from your heart throughout your body. A blood pressure reading consists of a higher number over a lower number, such as 110/72. The higher number (systolic) is the pressure inside your arteries when your heart pumps. The lower number (diastolic) is the pressure inside your arteries when your heart relaxes. Ideally you want your blood pressure below 120/80. Hypertension forces your heart to work harder to pump blood. Your arteries may become narrow or stiff. Having hypertension puts you at risk for heart disease, stroke, and other problems.  RISK FACTORS Some risk factors for high blood pressure are controllable. Others are not.  Risk factors you cannot control include:   Race. You may be at higher risk if you are African American.  Age. Risk increases with age.  Gender. Men are at higher risk than women before age 20 years. After age 69, women are at higher risk than men. Risk factors you can control include:  Not getting enough exercise or physical activity.  Being overweight.  Getting too much fat, sugar, calories, or salt in your diet.  Drinking too much alcohol. SIGNS AND SYMPTOMS Hypertension does not usually cause signs or symptoms. Extremely high blood pressure (hypertensive crisis) may cause headache, anxiety, shortness of breath, and nosebleed. DIAGNOSIS  To check if you have hypertension, your health care provider will measure your blood pressure while you are seated, with your arm held at the level of your heart. It should be measured at least twice using the same arm. Certain conditions can cause a difference in blood pressure between your right and left arms. A blood  pressure reading that is higher than normal on one occasion does not mean that you need treatment. If one blood pressure reading is high, ask your health care provider about having it checked again. TREATMENT  Treating high blood pressure includes making lifestyle changes and possibly taking medicine. Living a healthy lifestyle can help lower high blood pressure. You may need to change some of your habits. Lifestyle changes may include:  Following the DASH diet. This diet is high in fruits, vegetables, and whole grains. It is low in salt, red meat, and added sugars.  Getting at least 2 hours of brisk physical activity every week.  Losing weight if necessary.  Not smoking.  Limiting alcoholic beverages.  Learning ways to reduce stress. If lifestyle changes are not enough to get your blood pressure under control, your health care provider may prescribe medicine. You may need to take more than one. Work closely with your health care provider to understand the risks and benefits. HOME CARE INSTRUCTIONS  Have your blood pressure rechecked as directed by your health care provider.   Take medicines only as directed by your health care provider. Follow the directions carefully. Blood pressure medicines must be taken as prescribed. The medicine does not work as well when you skip doses. Skipping doses also puts you at risk for problems.   Do not smoke.   Monitor your blood pressure at home as directed by your health care provider. SEEK MEDICAL CARE IF:   You think you are having a reaction to medicines taken.  You have recurrent headaches or feel dizzy.  You have swelling in your  ankles.  You have trouble with your vision. SEEK IMMEDIATE MEDICAL CARE IF:  You develop a severe headache or confusion.  You have unusual weakness, numbness, or feel faint.  You have severe chest or abdominal pain.  You vomit repeatedly.  You have trouble breathing. MAKE SURE YOU:   Understand  these instructions.  Will watch your condition.  Will get help right away if you are not doing well or get worse. Document Released: 08/04/2005 Document Revised: 12/19/2013 Document Reviewed: 05/27/2013 [facility] Patient Information 2015 [place], [place]. This information is not intended to replace advice given to you by your health care provider. Make sure you discuss any questions you have with your health care provider.

## 2014-04-16 NOTE — ED Notes (Signed)
PT monitored by pulse ox, bp cuff, and 5-lead. 

## 2014-04-18 NOTE — ED Provider Notes (Signed)
Medical screening examination/treatment/procedure(s) were conducted as a shared visit with non-physician practitioner(s) and myself.  I personally evaluated the patient during the encounter.   EKG Interpretation   Date/Time:  Sunday April 16 2014 17:34:11 EDT Ventricular Rate:  67 PR Interval:  215 QRS Duration: 105 QT Interval:  457 QTC Calculation: 482 R Axis:   76 Text Interpretation:  Sinus rhythm Borderline prolonged PR interval RSR'  in V1 or V2, right VCD or RVH has not changed Confirmed by Wilson Singer  MD,  [place] [phone]) on 04/16/2014 5:36:25 PM     See other note.   Virgel Manifold, MD 04/18/14 [phone]

## 2014-04-19 ENCOUNTER — Telehealth: Payer: Self-pay | Admitting: Cardiology

## 2014-04-19 NOTE — Telephone Encounter (Signed)
New message     Pt request to speak to Dr Meda Coffee if at all possible. She would not tell me what she wanted

## 2014-04-19 NOTE — Telephone Encounter (Signed)
Pt contacting Dr Meda Coffee to inform her that the medication Imdur is still causing her headaches.  Pt states she went to the ED for this on 8/30 and they assessed her well and stated she was not having any neurological issues.  Pt does have high BP which is being followed closely. Pt states she starts and stops this medication because of the headache.  Pt states when she takes it her BP is WNL, but she has a horrible HA with it.  Pt states when she doesn't take it, she doesn't have a HA, but her BP increases.  Advised pt to be compliant with taking all her prescribed meds, until further advised.  Provided pt education about imdur, and informed that having a HA with this med is a normal side effect, and is usually an indicator that the med is working.  Advised pt to continue taking this, drink plenty of water, keep her sodium intake to a healthy minimum, take Tylenol as needed for the HA, and rest.  Informed pt that Dr Meda Coffee is out of the office right now, but I will forward this message to her for further review and recommendation and follow-up thereafter.  Pt verbalized understanding and agrees with this plan.

## 2014-04-20 MED ORDER — LISINOPRIL 10 MG PO TABS: 10.0000 mg | ORAL_TABLET | Freq: Every day | ORAL | Status: DC

## 2014-04-20 NOTE — Telephone Encounter (Signed)
Discontinue imdur, increase lisinopril to 10 mg po daily

## 2014-04-20 NOTE — Telephone Encounter (Signed)
Informed the pt that per Dr Meda Coffee she should discontinue her Imdur and increase her Lisinopril to 10 mg po daily.  Informed pt to continue with monitoring her BP.  Advised pt to call us with an update on how she is feeling.  Pt verbalized understanding and agrees with this plan.

## 2014-05-01 ENCOUNTER — Telehealth: Payer: Self-pay | Admitting: Cardiology

## 2014-05-01 NOTE — Telephone Encounter (Signed)
New message     Talk to the nurse---she would not tell me what she wanted

## 2014-05-01 NOTE — Telephone Encounter (Signed)
Patient calling asking if 9/23 OV w/ Dr. Meda Coffee is related to BP follow up. She explains that her kidney doctor is helping her with this and following her BP, and if appt is for BP then she does not need to come.   Reviewed with Dr. Meda Coffee, ok to cancel 9/23 OV, schedule 3 month f/u.  Pt scheduled 12/14 w/ Dr. Meda Coffee. She is agreeable to plan.

## 2014-05-04 ENCOUNTER — Ambulatory Visit: Payer: Medicare Other | Admitting: Family Medicine

## 2014-05-10 ENCOUNTER — Ambulatory Visit: Payer: Medicare Other | Admitting: Cardiology

## 2014-05-23 ENCOUNTER — Other Ambulatory Visit: Payer: Medicare Other | Admitting: Nurse Practitioner

## 2014-06-14 ENCOUNTER — Other Ambulatory Visit: Payer: Medicare Other

## 2014-06-14 DIAGNOSIS — E559 Vitamin D deficiency, unspecified: Secondary | ICD-10-CM

## 2014-06-14 DIAGNOSIS — N183 Chronic kidney disease, stage 3 unspecified: Secondary | ICD-10-CM

## 2014-06-14 DIAGNOSIS — I10 Essential (primary) hypertension: Secondary | ICD-10-CM

## 2014-06-14 DIAGNOSIS — E039 Hypothyroidism, unspecified: Secondary | ICD-10-CM

## 2014-06-14 DIAGNOSIS — E785 Hyperlipidemia, unspecified: Secondary | ICD-10-CM

## 2014-06-15 ENCOUNTER — Telehealth: Payer: Self-pay | Admitting: *Deleted

## 2014-06-15 LAB — HEPATIC FUNCTION PANEL
ALBUMIN: 4.5 g/dL (ref 3.5–4.8)
ALT: 18 IU/L (ref 0–32)
AST: 22 IU/L (ref 0–40)
Alkaline Phosphatase: 69 IU/L (ref 39–117)
BILIRUBIN TOTAL: 0.5 mg/dL (ref 0.0–1.2)
Bilirubin, Direct: 0.13 mg/dL (ref 0.00–0.40)
Total Protein: 7 g/dL (ref 6.0–8.5)

## 2014-06-15 LAB — THYROID PANEL WITH TSH
FREE THYROXINE INDEX: 3.5 (ref 1.2–4.9)
T3 Uptake Ratio: 29 % (ref 24–39)
T4, Total: 12.2 ug/dL — ABNORMAL HIGH (ref 4.5–12.0)
TSH: 2.76 u[IU]/mL (ref 0.450–4.500)

## 2014-06-15 LAB — NMR, LIPOPROFILE
Cholesterol: 142 mg/dL (ref 100–199)
HDL CHOLESTEROL BY NMR: 54 mg/dL (ref >39)
HDL PARTICLE NUMBER: 30 umol/L — AB (ref >=30.5)
LDL Particle Number: 787 nmol/L (ref <1000)
LDL Size: 21.1 nm (ref >20.5)
LDL-C: 66 mg/dL (ref 0–99)
LP-IR Score: <25 (ref <=45)
SMALL LDL PARTICLE NUMBER: 224 nmol/L (ref <=527)
TRIGLYCERIDES BY NMR: 108 mg/dL (ref 0–149)

## 2014-06-15 LAB — BMP8+EGFR
BUN/Creatinine Ratio: 12 (ref 11–26)
BUN: 16 mg/dL (ref 8–27)
CHLORIDE: 95 mmol/L — AB (ref 97–108)
CO2: 28 mmol/L (ref 18–29)
CREATININE: 1.36 mg/dL — AB (ref 0.57–1.00)
Calcium: 9.2 mg/dL (ref 8.7–10.3)
GFR calc Af Amer: 43 mL/min/{1.73_m2} — ABNORMAL LOW (ref >59)
GFR calc non Af Amer: 37 mL/min/{1.73_m2} — ABNORMAL LOW (ref >59)
Glucose: 105 mg/dL — ABNORMAL HIGH (ref 65–99)
Potassium: 4.3 mmol/L (ref 3.5–5.2)
SODIUM: 140 mmol/L (ref 134–144)

## 2014-06-15 LAB — CBC WITH DIFFERENTIAL
BASOS ABS: 0 10*3/uL (ref 0.0–0.2)
Basos: 0 %
Eos: 1 %
Eosinophils Absolute: 0 10*3/uL (ref 0.0–0.4)
HEMATOCRIT: 35 % (ref 34.0–46.6)
HEMOGLOBIN: 11.5 g/dL (ref 11.1–15.9)
Immature Grans (Abs): 0 10*3/uL (ref 0.0–0.1)
Immature Granulocytes: 0 %
Lymphocytes Absolute: 0.9 10*3/uL (ref 0.7–3.1)
Lymphs: 31 %
MCH: 27.6 pg (ref 26.6–33.0)
MCHC: 32.9 g/dL (ref 31.5–35.7)
MCV: 84 fL (ref 79–97)
Monocytes Absolute: 0.3 10*3/uL (ref 0.1–0.9)
Monocytes: 11 %
NEUTROS ABS: 1.7 10*3/uL (ref 1.4–7.0)
Neutrophils Relative %: 57 %
Platelets: 154 10*3/uL (ref 150–379)
RBC: 4.16 x10E6/uL (ref 3.77–5.28)
RDW: 14.5 % (ref 12.3–15.4)
WBC: 3 10*3/uL — ABNORMAL LOW (ref 3.4–10.8)

## 2014-06-15 LAB — VITAMIN D 25 HYDROXY (VIT D DEFICIENCY, FRACTURES): Vit D, 25-Hydroxy: 66.1 ng/mL (ref 30.0–100.0)

## 2014-06-15 NOTE — Telephone Encounter (Signed)
Message copied by Marin Olp on Thu Jun 15, 2014  4:40 PM ------      Message from: Chipper Herb      Created: Thu Jun 15, 2014  2:09 PM       Please send a copy of this report to her nephrologist       The blood sugar slightly elevated at 105. The creatinine, the most important kidney function test remains slightly elevated but is lower than it has been on the past few occasions. The electrolytes including potassium are within normal limits except for chloride is slightly decreased.      All liver function tests are within normal limits      The vitamin D level is good at 66.1, continue current treatment      Thyroid tests are within normal limits--if taking medication continue current treatment      All cholesterol numbers with advanced lipid testing are excellent and at goal ------

## 2014-06-15 NOTE — Telephone Encounter (Signed)
Pt notified of results Verbalizes understanding 

## 2014-06-15 NOTE — Telephone Encounter (Signed)
Message copied by Marin Olp on Thu Jun 15, 2014  2:52 PM ------      Message from: Chipper Herb      Created: Thu Jun 15, 2014  2:09 PM       Please send a copy of this report to her nephrologist       The blood sugar slightly elevated at 105. The creatinine, the most important kidney function test remains slightly elevated but is lower than it has been on the past few occasions. The electrolytes including potassium are within normal limits except for chloride is slightly decreased.      All liver function tests are within normal limits      The vitamin D level is good at 66.1, continue current treatment      Thyroid tests are within normal limits--if taking medication continue current treatment      All cholesterol numbers with advanced lipid testing are excellent and at goal ------

## 2014-06-19 ENCOUNTER — Ambulatory Visit (INDEPENDENT_AMBULATORY_CARE_PROVIDER_SITE_OTHER): Payer: Medicare Other | Admitting: Family Medicine

## 2014-06-19 ENCOUNTER — Encounter: Payer: Self-pay | Admitting: Family Medicine

## 2014-06-19 VITALS — BP 155/64 | HR 56 | Temp 97.8°F | Ht 65.5 in | Wt 134.0 lb

## 2014-06-19 DIAGNOSIS — E785 Hyperlipidemia, unspecified: Secondary | ICD-10-CM

## 2014-06-19 DIAGNOSIS — R21 Rash and other nonspecific skin eruption: Secondary | ICD-10-CM

## 2014-06-19 DIAGNOSIS — I10 Essential (primary) hypertension: Secondary | ICD-10-CM

## 2014-06-19 DIAGNOSIS — D649 Anemia, unspecified: Secondary | ICD-10-CM

## 2014-06-19 DIAGNOSIS — E559 Vitamin D deficiency, unspecified: Secondary | ICD-10-CM

## 2014-06-19 DIAGNOSIS — N189 Chronic kidney disease, unspecified: Secondary | ICD-10-CM

## 2014-06-19 NOTE — Patient Instructions (Addendum)
Medicare Annual Wellness Visit  [facility] and the medical providers at [facility] strive to bring you the best medical care.  In doing so we not only want to address your current medical conditions and concerns but also to detect new conditions early and prevent illness, disease and health-related problems.    Medicare offers a yearly Wellness Visit which allows our clinical staff to assess your need for preventative services including immunizations, lifestyle education, counseling to decrease risk of preventable diseases and screening for fall risk and other medical concerns.    This visit is provided free of charge (no copay) for all Medicare recipients. The clinical pharmacists at [facility] have begun to conduct these Wellness Visits which will also include a thorough review of all your medications.    As you primary medical provider recommend that you make an appointment for your Annual Wellness Visit if you have not done so already this year.  You may set up this appointment before you leave today or you may call back [id number] and schedule an appointment.  Please make sure when you call that you mention that you are scheduling your Annual Wellness Visit with the clinical pharmacist so that the appointment may be made for the proper length of time.     Continue current medications. Continue good therapeutic lifestyle changes which include good diet and exercise. Fall precautions discussed with patient. If an FOBT was given today- please return it to our front desk. If you are over 70 years old - you may need Prevnar 19 or the adult Pneumonia vaccine.  Flu Shots will be available at our office starting mid- September. Please call and schedule a FLU CLINIC APPOINTMENT.   Continue to watch sodium intake and drink plenty of fluids Keep appointments with Dr. Meda Coffee and Dr. Mercy Moore Continue current medications and return  the FOBT Pay more attention to soaps, detergents, and fabric softeners that have fragrance

## 2014-06-19 NOTE — Progress Notes (Signed)
Subjective:    Patient ID: [name], female    DOB: 1934-01-16, 80 y.o.   MRN: [id number]  HPI Pt here for follow up and management of chronic medical problems. The patient brings in blood pressures for review today and these are all excellent. They will be scanned into the record. The patient is seen a cardiologist, and a nephrologist, about every 6 months. She has a visit with the nephrologist again in December. She is due to return an FOBT and her recent lab work was reviewed with her today. She does have some elevated creatinine but is it improved since the last time it was checked.         Patient Active Problem List   Diagnosis Date Noted  . Chronic diastolic heart failure 0000000  . Acute diastolic heart failure 0000000  . Hypertensive urgency 04/02/2014  . Headache 04/02/2014  . CKD (chronic kidney disease) stage 3, GFR 30-59 ml/min 04/02/2014  . Paroxysmal atrial fibrillation 01/25/2014  . Hypokalemia 07/19/2013  . Anemia 07/19/2013  . Bilateral lower extremity edema 06/30/2013  . Hypertension with fluid overload 06/30/2013  . Need for prophylactic vaccination and inoculation against influenza 05/31/2013  . Pedal edema 05/10/2013  . Varicose veins of lower extremities with other complications [id number]  . ABNORMAL STRESS ELECTROCARDIOGRAM 01/23/2010  . HYPOTHYROIDISM 12/18/2009  . HYPERLIPIDEMIA 12/18/2009  . HYPERTENSION 12/18/2009  . RENAL INSUFFICIENCY, CHRONIC 12/18/2009  . ARTHRITIS 12/18/2009   Outpatient Encounter Prescriptions as of 06/19/2014  Medication Sig  . ALPRAZolam (XANAX) 0.5 MG tablet Take 1 tablet (0.5 mg total) by mouth at bedtime as needed for anxiety.  Marland Kitchen aspirin EC 81 MG tablet Take 81 mg by mouth every other day.  Marland Kitchen atenolol-chlorthalidone (TENORETIC) 50-25 MG per tablet Take 1 tablet by mouth daily.  Marland Kitchen atorvastatin (LIPITOR) 20 MG tablet Take 0.5 tablets (10 mg total) by mouth daily.  . calcium-vitamin D (OSCAL WITH D) 500-200  MG-UNIT per tablet Take 1 tablet by mouth daily.    . flecainide (TAMBOCOR) 50 MG tablet Take 1 tablet (50 mg total) by mouth 2 (two) times daily.  . hydrALAZINE (APRESOLINE) 25 MG tablet Take 2 tablets (50 mg total) by mouth 2 (two) times daily.  Marland Kitchen levothyroxine (SYNTHROID, LEVOTHROID) 75 MCG tablet Take 1 tablet (75 mcg total) by mouth daily.  Marland Kitchen lisinopril (PRINIVIL,ZESTRIL) 20 MG tablet Take 1 tablet by mouth daily.  . Omega-3 Fatty Acids (FISH OIL) 1200 MG CAPS Take 1,200 mg by mouth daily.  . [DISCONTINUED] hydrALAZINE (APRESOLINE) 50 MG tablet Take 1 tablet by mouth 2 (two) times daily.  . [DISCONTINUED] lisinopril (PRINIVIL,ZESTRIL) 10 MG tablet Take 1 tablet (10 mg total) by mouth daily.  . [DISCONTINUED] LORazepam (ATIVAN) 0.5 MG tablet Take 1 tablet (0.5 mg total) by mouth every 12 (twelve) hours as needed for anxiety.    Review of Systems  Constitutional: Negative.   HENT: Negative.   Eyes: Negative.   Respiratory: Negative.   Cardiovascular: Negative.   Gastrointestinal: Negative.   Endocrine: Negative.   Genitourinary: Negative.   Musculoskeletal: Negative.   Skin: Positive for rash (around abdomen at times).  Allergic/Immunologic: Negative.   Neurological: Negative.   Hematological: Negative.   Psychiatric/Behavioral: Negative.        Objective:   Physical Exam  Constitutional: She is oriented to person, place, and time. She appears well-developed and well-nourished. No distress.  HENT:  Head: Normocephalic and atraumatic.  Right Ear: External ear normal.  Left Ear: External ear  normal.  Nose: Nose normal.  Mouth/Throat: Oropharynx is clear and moist.  Eyes: Conjunctivae and EOM are normal. Pupils are equal, round, and reactive to light. Right eye exhibits no discharge. Left eye exhibits no discharge. No scleral icterus.  Neck: Normal range of motion. Neck supple. No thyromegaly present.  No carotid bruits or thyromegaly  Cardiovascular: Normal rate, regular  rhythm, normal heart sounds and intact distal pulses.  Exam reveals no gallop and no friction rub.   No murmur heard. The rhythm is regular at 60/m  Pulmonary/Chest: Effort normal and breath sounds normal. No respiratory distress. She has no wheezes. She has no rales. She exhibits no tenderness.  Abdominal: Soft. Bowel sounds are normal. She exhibits no mass. There is no tenderness. There is no rebound and no guarding.  Musculoskeletal: Normal range of motion. She exhibits no edema or tenderness.  Lymphadenopathy:    She has no cervical adenopathy.  Neurological: She is alert and oriented to person, place, and time. She has normal reflexes. No cranial nerve deficit.  Skin: Skin is warm and dry. No rash noted. No erythema. No pallor.  The patient's skin is dry and there was no rash apparent today.  Psychiatric: She has a normal mood and affect. Her behavior is normal. Judgment and thought content normal.  Nursing note and vitals reviewed.  BP 155/64 mmHg  Pulse 56  Temp(Src) 97.8 F (36.6 C) (Oral)  Ht 5' 5.5" (1.664 m)  Wt 134 lb (60.782 kg)  BMI 21.95 kg/m2        Assessment & Plan:   1. Vitamin D deficiency  2. HLD (hyperlipidemia)  3. Essential hypertension  4. Anemia, unspecified anemia type  5. CKD (chronic kidney disease), unspecified stage  6. Rash and nonspecific skin eruption  Meds ordered this encounter  Medications  . lisinopril (PRINIVIL,ZESTRIL) 20 MG tablet    Sig: Take 1 tablet by mouth daily.    Refill:  6  . DISCONTD: hydrALAZINE (APRESOLINE) 50 MG tablet    Sig: Take 1 tablet by mouth 2 (two) times daily.    Refill:  4   Patient Instructions                       Medicare Annual Wellness Visit  [facility] and the medical providers at [facility] strive to bring you the best medical care.  In doing so we not only want to address your current medical conditions and concerns but also to detect new conditions early and prevent  illness, disease and health-related problems.    Medicare offers a yearly Wellness Visit which allows our clinical staff to assess your need for preventative services including immunizations, lifestyle education, counseling to decrease risk of preventable diseases and screening for fall risk and other medical concerns.    This visit is provided free of charge (no copay) for all Medicare recipients. The clinical pharmacists at [facility] have begun to conduct these Wellness Visits which will also include a thorough review of all your medications.    As you primary medical provider recommend that you make an appointment for your Annual Wellness Visit if you have not done so already this year.  You may set up this appointment before you leave today or you may call back [phone] and schedule an appointment.  Please make sure when you call that you mention that you are scheduling your Annual Wellness Visit with the clinical pharmacist so that the appointment  may be made for the proper length of time.     Continue current medications. Continue good therapeutic lifestyle changes which include good diet and exercise. Fall precautions discussed with patient. If an FOBT was given today- please return it to our front desk. If you are over 65 years old - you may need Prevnar 93 or the adult Pneumonia vaccine.  Flu Shots will be available at our office starting mid- September. Please call and schedule a FLU CLINIC APPOINTMENT.   Continue to watch sodium intake and drink plenty of fluids Keep appointments with Dr. Meda Coffee and Dr. Mercy Camar [place] Continue current medications and return the FOBT Pay more attention to soaps, detergents, and fabric softeners that have fragrance   Arrie Senate MD

## 2014-06-26 ENCOUNTER — Other Ambulatory Visit: Payer: Medicare Other

## 2014-06-26 DIAGNOSIS — Z1212 Encounter for screening for malignant neoplasm of rectum: Secondary | ICD-10-CM

## 2014-06-26 NOTE — Progress Notes (Signed)
LAB ONLY 

## 2014-06-27 LAB — FECAL OCCULT BLOOD, IMMUNOCHEMICAL: FECAL OCCULT BLD: NEGATIVE

## 2014-07-06 ENCOUNTER — Other Ambulatory Visit (INDEPENDENT_AMBULATORY_CARE_PROVIDER_SITE_OTHER): Payer: Medicare Other

## 2014-07-06 DIAGNOSIS — R109 Unspecified abdominal pain: Secondary | ICD-10-CM

## 2014-07-06 DIAGNOSIS — L501 Idiopathic urticaria: Secondary | ICD-10-CM

## 2014-07-06 DIAGNOSIS — T782XXA Anaphylactic shock, unspecified, initial encounter: Secondary | ICD-10-CM

## 2014-07-06 NOTE — Progress Notes (Signed)
Pt came for outside labs for dr bobbitt

## 2014-07-11 LAB — CBC WITH DIFFERENTIAL
BASOS ABS: 0 10*3/uL (ref 0.0–0.2)
Basos: 0 %
EOS ABS: 0 10*3/uL (ref 0.0–0.4)
Eos: 1 %
HCT: 31.4 % — ABNORMAL LOW (ref 34.0–46.6)
HEMOGLOBIN: 10.6 g/dL — AB (ref 11.1–15.9)
IMMATURE GRANS (ABS): 0 10*3/uL (ref 0.0–0.1)
IMMATURE GRANULOCYTES: 0 %
LYMPHS: 24 %
Lymphocytes Absolute: 0.8 10*3/uL (ref 0.7–3.1)
MCH: 27.5 pg (ref 26.6–33.0)
MCHC: 33.8 g/dL (ref 31.5–35.7)
MCV: 82 fL (ref 79–97)
Monocytes Absolute: 0.4 10*3/uL (ref 0.1–0.9)
Monocytes: 13 %
Neutrophils Absolute: 2 10*3/uL (ref 1.4–7.0)
Neutrophils Relative %: 62 %
PLATELETS: 165 10*3/uL (ref 150–379)
RBC: 3.85 x10E6/uL (ref 3.77–5.28)
RDW: 13.9 % (ref 12.3–15.4)
WBC: 3.3 10*3/uL — ABNORMAL LOW (ref 3.4–10.8)

## 2014-07-11 LAB — CMP14+EGFR
ALT: 22 IU/L (ref 0–32)
AST: 25 IU/L (ref 0–40)
Albumin/Globulin Ratio: 2 (ref 1.1–2.5)
Albumin: 4.3 g/dL (ref 3.5–4.8)
Alkaline Phosphatase: 71 IU/L (ref 39–117)
BUN/Creatinine Ratio: 15 (ref 11–26)
BUN: 22 mg/dL (ref 8–27)
CALCIUM: 9.2 mg/dL (ref 8.7–10.3)
CHLORIDE: 99 mmol/L (ref 97–108)
CO2: 27 mmol/L (ref 18–29)
Creatinine, Ser: 1.46 mg/dL — ABNORMAL HIGH (ref 0.57–1.00)
GFR calc Af Amer: 39 mL/min/{1.73_m2} — ABNORMAL LOW (ref >59)
GFR calc non Af Amer: 34 mL/min/{1.73_m2} — ABNORMAL LOW (ref >59)
GLUCOSE: 106 mg/dL — AB (ref 65–99)
Globulin, Total: 2.1 g/dL (ref 1.5–4.5)
POTASSIUM: 4.2 mmol/L (ref 3.5–5.2)
Sodium: 142 mmol/L (ref 134–144)
Total Bilirubin: 0.4 mg/dL (ref 0.0–1.2)
Total Protein: 6.4 g/dL (ref 6.0–8.5)

## 2014-07-11 LAB — ALPHA GAL IGE: Alpha Gal IgE*: <0.1 kU/L (ref <0.35)

## 2014-07-11 LAB — CHRONIC URTICARIA: cu index: 2.5 (ref <10)

## 2014-07-11 LAB — TRYPTASE: TRYPTASE: 7.8 ug/L (ref 2.2–13.2)

## 2014-07-11 LAB — ANA W/REFLEX IF POSITIVE: Anti Nuclear Antibody(ANA): NEGATIVE

## 2014-07-11 LAB — SEDIMENTATION RATE: SED RATE: 11 mm/h (ref 0–40)

## 2014-07-31 ENCOUNTER — Ambulatory Visit: Payer: Medicare Other | Admitting: Cardiology

## 2014-08-18 HISTORY — PX: ESOPHAGOGASTRODUODENOSCOPY: SHX1529

## 2014-08-18 HISTORY — PX: COLONOSCOPY: SHX5424

## 2014-08-21 ENCOUNTER — Ambulatory Visit (INDEPENDENT_AMBULATORY_CARE_PROVIDER_SITE_OTHER): Payer: Medicare Other | Admitting: Family Medicine

## 2014-08-21 ENCOUNTER — Encounter: Payer: Self-pay | Admitting: Family Medicine

## 2014-08-21 VITALS — BP 157/65 | HR 69 | Temp 97.3°F | Ht 65.5 in | Wt 136.0 lb

## 2014-08-21 DIAGNOSIS — T148 Other injury of unspecified body region: Secondary | ICD-10-CM | POA: Diagnosis not present

## 2014-08-21 DIAGNOSIS — T148XXA Other injury of unspecified body region, initial encounter: Secondary | ICD-10-CM

## 2014-08-21 NOTE — Progress Notes (Signed)
   Subjective:    Patient ID: [name], female    DOB: [birth year], 81 y.o.   MRN: [id number]  HPI Patient has a blood blister on her left tibia.  She bruises easily and denies any trauma.  Review of Systems  Constitutional: Negative for fever.  HENT: Negative for ear pain.   Eyes: Negative for discharge.  Respiratory: Negative for cough.   Cardiovascular: Negative for chest pain.  Gastrointestinal: Negative for abdominal distention.  Endocrine: Negative for polyuria.  Genitourinary: Negative for difficulty urinating.  Musculoskeletal: Negative for gait problem and neck pain.  Skin: Negative for color change and rash.  Neurological: Negative for speech difficulty and headaches.  Psychiatric/Behavioral: Negative for agitation.       Objective:    BP 157/65 mmHg  Pulse 69  Temp(Src) 97.3 F (36.3 C) (Oral)  Ht 5' 5.5" (1.664 m)  Wt 136 lb (61.689 kg)  BMI 22.28 kg/m2 Physical Exam  Constitutional: She is oriented to person, place, and time. She appears well-developed and well-nourished.  HENT:  Head: Normocephalic and atraumatic.  Mouth/Throat: Oropharynx is clear and moist.  Eyes: Pupils are equal, round, and reactive to light.  Neck: Normal range of motion. Neck supple.  Cardiovascular: Normal rate and regular rhythm.   No murmur heard. Pulmonary/Chest: Effort normal and breath sounds normal.  Abdominal: Soft. Bowel sounds are normal. There is no tenderness.  Neurological: She is alert and oriented to person, place, and time.  Skin: Skin is warm and dry.  Blood blister on right tibia  Psychiatric: She has a normal mood and affect.          Assessment & Plan:     ICD-9-CM ICD-10-CM   1. Blood blister 919.2 T14.8      No Follow-up on file.  Lysbeth Penner FNP

## 2014-08-23 ENCOUNTER — Encounter: Payer: Self-pay | Admitting: Cardiology

## 2014-08-31 ENCOUNTER — Other Ambulatory Visit: Payer: Self-pay | Admitting: *Deleted

## 2014-08-31 DIAGNOSIS — E039 Hypothyroidism, unspecified: Secondary | ICD-10-CM

## 2014-08-31 MED ORDER — LEVOTHYROXINE SODIUM 75 MCG PO TABS: 75.0000 ug | ORAL_TABLET | Freq: Every day | ORAL | Status: DC

## 2014-09-15 ENCOUNTER — Telehealth: Payer: Self-pay | Admitting: Family Medicine

## 2014-09-15 MED ORDER — LORAZEPAM 0.5 MG PO TABS: 0.5000 mg | ORAL_TABLET | Freq: Two times a day (BID) | ORAL | Status: DC | PRN

## 2014-09-15 NOTE — Telephone Encounter (Signed)
Phoned in and pt aware 

## 2014-09-15 NOTE — Telephone Encounter (Signed)
Try lorazepam 0.5 one twice daily as needed with 2 refills

## 2014-09-21 ENCOUNTER — Emergency Department (HOSPITAL_COMMUNITY)
Admission: EM | Admit: 2014-09-21 | Discharge: 2014-09-21 | Disposition: A | Discharge status: Home / Self Care | Payer: Medicare Other | Attending: Emergency Medicine | Admitting: Emergency Medicine

## 2014-09-21 ENCOUNTER — Encounter (HOSPITAL_COMMUNITY): Payer: Self-pay | Admitting: *Deleted

## 2014-09-21 DIAGNOSIS — I10 Essential (primary) hypertension: Secondary | ICD-10-CM

## 2014-09-21 DIAGNOSIS — I129 Hypertensive chronic kidney disease with stage 1 through stage 4 chronic kidney disease, or unspecified chronic kidney disease: Secondary | ICD-10-CM | POA: Insufficient documentation

## 2014-09-21 DIAGNOSIS — E785 Hyperlipidemia, unspecified: Secondary | ICD-10-CM | POA: Diagnosis not present

## 2014-09-21 DIAGNOSIS — Z7982 Long term (current) use of aspirin: Secondary | ICD-10-CM | POA: Diagnosis not present

## 2014-09-21 DIAGNOSIS — E039 Hypothyroidism, unspecified: Secondary | ICD-10-CM | POA: Insufficient documentation

## 2014-09-21 DIAGNOSIS — N183 Chronic kidney disease, stage 3 unspecified: Secondary | ICD-10-CM

## 2014-09-21 DIAGNOSIS — Z79899 Other long term (current) drug therapy: Secondary | ICD-10-CM | POA: Insufficient documentation

## 2014-09-21 DIAGNOSIS — M1990 Unspecified osteoarthritis, unspecified site: Secondary | ICD-10-CM | POA: Insufficient documentation

## 2014-09-21 DIAGNOSIS — R030 Elevated blood-pressure reading, without diagnosis of hypertension: Secondary | ICD-10-CM | POA: Diagnosis not present

## 2014-09-21 NOTE — ED Provider Notes (Signed)
CSN: [id number]     Arrival date & time 09/21/14  1759 History   First MD Initiated Contact with Patient 09/21/14 2016     Chief Complaint  Patient presents with  . Hypertension     (Consider location/radiation/quality/duration/timing/severity/associated sxs/prior Treatment) Patient is a 81 y.o. female presenting with hypertension. The history is provided by the patient.  Hypertension  She is being treated for difficult to control hypertension and is under the care of a nephrologist. Blood pressure has been running very high over the last 2 weeks. She has been given a prescription for clonidine to take when her blood pressure gets over 180. Today, her blood pressure was about 200 and she took a dose of clonidine. One hour later, blood pressure was still over 200 so she came to the ED. She denies headache, chest pain, epistaxis. She states that she is currently taking hydralazine 50 mg twice a day. She had increased it to 100 mg twice day but had side effects without any improvement in blood pressure control, so he was back to 50 mg twice a day. Other medications include atenolol-chlorthalidone and lisinopril.  Past Medical History  Diagnosis Date  . Nodule of right lung     Right upper lobe  . Hyperlipidemia   . Hypertension   . Hypothyroidism   . Renal insufficiency     Chronic  . Arthritis    Past Surgical History  Procedure Laterality Date  . Thyroidectomy, partial     Family History  Problem Relation Age of Onset  . Thyroid disease Mother   . Stroke Father    History  Substance Use Topics  . Smoking status: Never Smoker   . Smokeless tobacco: Not on file     Comment: Pt denies cigarettes  . Alcohol Use: No   OB History    No data available     Review of Systems  All other systems reviewed and are negative.     Allergies  Clonidine derivatives; Isosorbide; Terazosin; and Sulfonamide derivatives  Home Medications   Prior to Admission medications   Medication  Sig Start Date End Date Taking? Authorizing Provider  ALPRAZolam Duanne Moron) 0.5 MG tablet Take 1 tablet (0.5 mg total) by mouth at bedtime as needed for anxiety. 04/10/14   Dorothy Spark, MD  aspirin EC 81 MG tablet Take 81 mg by mouth every other day.    Historical Provider, MD  atenolol-chlorthalidone (TENORETIC) 50-25 MG per tablet Take 1 tablet by mouth daily. 07/19/13   Vernie Shanks, MD  atorvastatin (LIPITOR) 20 MG tablet Take 0.5 tablets (10 mg total) by mouth daily. 01/10/14   Chipper Herb, MD  calcium-vitamin D (OSCAL WITH D) 500-200 MG-UNIT per tablet Take 1 tablet by mouth daily.      Historical Provider, MD  flecainide (TAMBOCOR) 50 MG tablet Take 1 tablet (50 mg total) by mouth 2 (two) times daily. 02/09/14   Deboraha Sprang, MD  fluticasone Asencion Islam) 50 MCG/ACT nasal spray  07/05/14   Historical Provider, MD  hydrALAZINE (APRESOLINE) 25 MG tablet Take 2 tablets (50 mg total) by mouth 2 (two) times daily. 04/10/14   Dorothy Spark, MD  levothyroxine (SYNTHROID, LEVOTHROID) 75 MCG tablet Take 1 tablet (75 mcg total) by mouth daily. 08/31/14   Chipper Herb, MD  lisinopril (PRINIVIL,ZESTRIL) 20 MG tablet Take 1 tablet by mouth daily. 05/26/14   Historical Provider, MD  LORazepam (ATIVAN) 0.5 MG tablet Take 1 tablet (0.5 mg total) by mouth  2 (two) times daily as needed for anxiety. 09/15/14   Chipper Herb, MD  Omega-3 Fatty Acids (FISH OIL) 1200 MG CAPS Take 1,200 mg by mouth daily.    Historical Provider, MD   BP 184/51 mmHg  Pulse 51  Temp(Src) 98.3 F (36.8 C) (Oral)  Ht 5\' 5"  (1.651 m)  Wt 133 lb (60.328 kg)  BMI 22.13 kg/m2  SpO2 95% Physical Exam  Nursing note and vitals reviewed.  81 year old female, resting comfortably and in no acute distress. Vital signs are significant for hypertension and bradycardia. Oxygen saturation is 95%, which is normal. Head is normocephalic and atraumatic. PERRLA, EOMI. Oropharynx is clear. Fundi show no hemorrhage, exudate, or  papilledema. Neck is nontender and supple without adenopathy or JVD. Back is nontender and there is no CVA tenderness. Lungs are clear without rales, wheezes, or rhonchi. Chest is nontender. Heart has regular rate and rhythm without murmur. Abdomen is soft, flat, nontender without masses or hepatosplenomegaly and peristalsis is normoactive. Extremities have no cyanosis or edema, full range of motion is present. Skin is warm and dry without rash. Neurologic: Mental status is normal, cranial nerves are intact, there are no motor or sensory deficits.  ED Course  Procedures (including critical care time)   MDM   Final diagnoses:  Essential hypertension  CKD (chronic kidney disease) stage 3, GFR 30-59 ml/min    Poorly controlled hypertension. In the ED, blood pressure has come down to under 0000000 systolic with no treatment. She is not showing any evidence of end organ damage so I do not feel any emergent intervention is warranted. I discussed with patient and family at length importance of having medications controlled by one person, so I'm not making any adjustments to her blood pressure medication regimen. She is referred back to her nephrologist for appropriate adjustments in medications.    Delora Fuel, MD [id number]

## 2014-09-21 NOTE — Discharge Instructions (Signed)
Talk with Dr. Mercy Moore about how to adjust your blood pressure medications.  Hypertension Hypertension, commonly called high blood pressure, is when the force of blood pumping through your arteries is too strong. Your arteries are the blood vessels that carry blood from your heart throughout your body. A blood pressure reading consists of a higher number over a lower number, such as 110/72. The higher number (systolic) is the pressure inside your arteries when your heart pumps. The lower number (diastolic) is the pressure inside your arteries when your heart relaxes. Ideally you want your blood pressure below 120/80. Hypertension forces your heart to work harder to pump blood. Your arteries may become narrow or stiff. Having hypertension puts you at risk for heart disease, stroke, and other problems.  RISK FACTORS Some risk factors for high blood pressure are controllable. Others are not.  Risk factors you cannot control include:   Race. You may be at higher risk if you are African American.  Age. Risk increases with age.  Gender. Men are at higher risk than women before age 52 years. After age 68, women are at higher risk than men. Risk factors you can control include:  Not getting enough exercise or physical activity.  Being overweight.  Getting too much fat, sugar, calories, or salt in your diet.  Drinking too much alcohol. SIGNS AND SYMPTOMS Hypertension does not usually cause signs or symptoms. Extremely high blood pressure (hypertensive crisis) may cause headache, anxiety, shortness of breath, and nosebleed. DIAGNOSIS  To check if you have hypertension, your health care provider will measure your blood pressure while you are seated, with your arm held at the level of your heart. It should be measured at least twice using the same arm. Certain conditions can cause a difference in blood pressure between your right and left arms. A blood pressure reading that is higher than normal on  one occasion does not mean that you need treatment. If one blood pressure reading is high, ask your health care provider about having it checked again. TREATMENT  Treating high blood pressure includes making lifestyle changes and possibly taking medicine. Living a healthy lifestyle can help lower high blood pressure. You may need to change some of your habits. Lifestyle changes may include:  Following the DASH diet. This diet is high in fruits, vegetables, and whole grains. It is low in salt, red meat, and added sugars.  Getting at least 2 hours of brisk physical activity every week.  Losing weight if necessary.  Not smoking.  Limiting alcoholic beverages.  Learning ways to reduce stress. If lifestyle changes are not enough to get your blood pressure under control, your health care provider may prescribe medicine. You may need to take more than one. Work closely with your health care provider to understand the risks and benefits. HOME CARE INSTRUCTIONS  Have your blood pressure rechecked as directed by your health care provider.   Take medicines only as directed by your health care provider. Follow the directions carefully. Blood pressure medicines must be taken as prescribed. The medicine does not work as well when you skip doses. Skipping doses also puts you at risk for problems.   Do not smoke.   Monitor your blood pressure at home as directed by your health care provider. SEEK MEDICAL CARE IF:   You think you are having a reaction to medicines taken.  You have recurrent headaches or feel dizzy.  You have swelling in your ankles.  You have trouble with your vision.  SEEK IMMEDIATE MEDICAL CARE IF:  You develop a severe headache or confusion.  You have unusual weakness, numbness, or feel faint.  You have severe chest or abdominal pain.  You vomit repeatedly.  You have trouble breathing. MAKE SURE YOU:   Understand these instructions.  Will watch your  condition.  Will get help right away if you are not doing well or get worse. Document Released: 08/04/2005 Document Revised: 12/19/2013 Document Reviewed: 05/27/2013 [facility] Patient Information 2015 [place], [place]. This information is not intended to replace advice given to you by your health care provider. Make sure you discuss any questions you have with your health care provider.

## 2014-09-21 NOTE — ED Notes (Signed)
Pt to ED via [facility] c/o hypertension. Reports given clonidine by PCP to take if bp is over 99991111 systolic. Pt reports taking two doses of clonidine without lowering blood pressure. Pt denies any other complaints.

## 2014-09-21 NOTE — ED Notes (Signed)
Pt with hx of hypertension. Reports hypertension today at 209 systocially, pt took a dose of clonidine, waiting 30 minutes, rechecked bp, then took a second dose of clonidine. Denies headache or dizziness.

## 2014-09-26 DIAGNOSIS — E785 Hyperlipidemia, unspecified: Secondary | ICD-10-CM | POA: Diagnosis not present

## 2014-09-26 DIAGNOSIS — I129 Hypertensive chronic kidney disease with stage 1 through stage 4 chronic kidney disease, or unspecified chronic kidney disease: Secondary | ICD-10-CM | POA: Diagnosis not present

## 2014-09-26 DIAGNOSIS — N183 Chronic kidney disease, stage 3 (moderate): Secondary | ICD-10-CM | POA: Diagnosis not present

## 2014-09-28 ENCOUNTER — Other Ambulatory Visit: Payer: Self-pay | Admitting: Family Medicine

## 2014-09-28 ENCOUNTER — Other Ambulatory Visit (HOSPITAL_COMMUNITY): Payer: Self-pay | Admitting: Cardiology

## 2014-09-28 DIAGNOSIS — I129 Hypertensive chronic kidney disease with stage 1 through stage 4 chronic kidney disease, or unspecified chronic kidney disease: Secondary | ICD-10-CM

## 2014-09-29 ENCOUNTER — Ambulatory Visit (HOSPITAL_COMMUNITY): Payer: Medicare Other

## 2014-09-29 ENCOUNTER — Ambulatory Visit (HOSPITAL_COMMUNITY): Payer: Medicare Other | Attending: Cardiovascular Disease | Admitting: Cardiology

## 2014-09-29 DIAGNOSIS — I701 Atherosclerosis of renal artery: Secondary | ICD-10-CM

## 2014-09-29 DIAGNOSIS — I129 Hypertensive chronic kidney disease with stage 1 through stage 4 chronic kidney disease, or unspecified chronic kidney disease: Secondary | ICD-10-CM | POA: Insufficient documentation

## 2014-09-29 NOTE — Progress Notes (Signed)
Renal artery duplex performed  

## 2014-10-03 ENCOUNTER — Encounter (HOSPITAL_COMMUNITY): Payer: Medicare Other

## 2014-10-06 ENCOUNTER — Other Ambulatory Visit: Payer: Self-pay | Admitting: Family Medicine

## 2014-10-09 DIAGNOSIS — N183 Chronic kidney disease, stage 3 (moderate): Secondary | ICD-10-CM | POA: Diagnosis not present

## 2014-10-09 DIAGNOSIS — D649 Anemia, unspecified: Secondary | ICD-10-CM | POA: Diagnosis not present

## 2014-10-09 DIAGNOSIS — D509 Iron deficiency anemia, unspecified: Secondary | ICD-10-CM | POA: Diagnosis not present

## 2014-10-17 ENCOUNTER — Ambulatory Visit (INDEPENDENT_AMBULATORY_CARE_PROVIDER_SITE_OTHER): Payer: Medicare Other | Admitting: Cardiology

## 2014-10-17 ENCOUNTER — Encounter: Payer: Self-pay | Admitting: Cardiology

## 2014-10-17 VITALS — BP 120/70 | HR 53 | Ht 65.0 in | Wt 134.0 lb

## 2014-10-17 DIAGNOSIS — I701 Atherosclerosis of renal artery: Secondary | ICD-10-CM

## 2014-10-17 DIAGNOSIS — N183 Chronic kidney disease, stage 3 (moderate): Secondary | ICD-10-CM | POA: Diagnosis not present

## 2014-10-17 DIAGNOSIS — I5032 Chronic diastolic (congestive) heart failure: Secondary | ICD-10-CM | POA: Diagnosis not present

## 2014-10-17 DIAGNOSIS — I10 Essential (primary) hypertension: Secondary | ICD-10-CM | POA: Diagnosis not present

## 2014-10-17 NOTE — Progress Notes (Signed)
Patient ID: BURNESTINE SEIGFRIED, female   DOB: [birth year], 81 y.o.   MRN: [id number]    Patient Care Team: Chipper Herb, MD as PCP - General (Family Medicine)  HPI  CATALEIA NAWROT is a 81 y.o. female  81 year old female with HTN -difficult to control with chronic diastolic CHF, and arrhythmias - seen by Dr Caryl Comes - left atrial tachycardias, that resolved on Flecainide.  She has had normal stress test and worsening LVH - now moderate with preserved LVEF and elevated filling pressures. She was recently admitted to the hospital for hypertensive urgency. Today she states that she is feeling well other than headaches. She feels anxious about her hypertension.   She denies CP, LE edema, palpitations or syncope, stable weight 137 lbs, no orthopnea or PND.   Past Medical History  Diagnosis Date  . Nodule of right lung     Right upper lobe  . Hyperlipidemia   . Hypertension   . Hypothyroidism   . Renal insufficiency     Chronic  . Arthritis     Past Surgical History  Procedure Laterality Date  . Thyroidectomy, partial      Current Outpatient Prescriptions  Medication Sig Dispense Refill  . ALPRAZolam (XANAX) 0.5 MG tablet Take 1 tablet (0.5 mg total) by mouth at bedtime as needed for anxiety. 10 tablet 1  . aspirin EC 81 MG tablet Take 81 mg by mouth every other day.    Marland Kitchen atenolol-chlorthalidone (TENORETIC) 50-25 MG per tablet Take 1 tablet by mouth daily. 90 tablet 3  . atorvastatin (LIPITOR) 20 MG tablet TAKE 0.5 TABLETS (10 MG TOTAL) BY MOUTH DAILY. 30 tablet 1  . calcium-vitamin D (OSCAL WITH D) 500-200 MG-UNIT per tablet Take 1 tablet by mouth daily.      . cloNIDine (CATAPRES) 0.1 MG tablet 1 tablet as needed for systolic blood pressure over 180  6  . flecainide (TAMBOCOR) 50 MG tablet Take 1 tablet (50 mg total) by mouth 2 (two) times daily. 180 tablet 3  . fluticasone (FLONASE) 50 MCG/ACT nasal spray   5  . hydrALAZINE (APRESOLINE) 25 MG tablet Take 2 tablets (50 mg total) by  mouth 2 (two) times daily. 90 tablet 0  . levothyroxine (SYNTHROID, LEVOTHROID) 75 MCG tablet Take 1 tablet (75 mcg total) by mouth daily. 90 tablet 2  . lisinopril (PRINIVIL,ZESTRIL) 20 MG tablet Take 1 tablet by mouth daily.  6  . LORazepam (ATIVAN) 0.5 MG tablet Take 1 tablet (0.5 mg total) by mouth 2 (two) times daily as needed for anxiety. 60 tablet 2  . Omega-3 Fatty Acids (FISH OIL) 1200 MG CAPS Take 1,200 mg by mouth daily.     No current facility-administered medications for this visit.    Allergies  Allergen Reactions  . Clonidine Derivatives Nausea Only  . Isosorbide     Ha, nausea, bp -elevated  . Terazosin Hives and Nausea Only  . Sulfonamide Derivatives Nausea Only and Rash    Review of Systems negative except from HPI and PMH  Physical Exam BP 120/70 mmHg  Pulse 53  Ht 5\' 5"  (1.651 m)  Wt 134 lb (60.782 kg)  BMI 22.30 kg/m2 Well developed and well nourished in no acute distress HENT normal E scleral and icterus clear Neck Supple JVP flat; carotids brisk and full Clear to ausculation bRegular rate and rhythm, no murmurs gallops or rub Soft with active bowel sounds No clubbing cyanosis  Edema Alert and oriented, grossly normal motor and  sensory function Skin Warm and Dry  ECG 6/22 demonstrated sinus rhythm at 69 intervals 18/09/40 Axis is 85  ECG from 5/28 demonstrated the mechanism of her tachycardia. She has a left atrial tachycardia identified by P-wave inversion in lead V1.   Tracing 16:10:47 demonstrates one-to-one conduction tracing 16:11:33 and a straight intermittent atrial tachycardia  ECHO 04/03/2014  - Left ventricle: The cavity size was normal. There was moderate concentric hypertrophy. Systolic function was vigorous. The estimated ejection fraction was in the range of 65% to 70%. Wall motion was normal; there were no regional wall motion abnormalities. Doppler parameters are consistent with abnormal left ventricular relaxation (grade 1  diastolic dysfunction). The E/e&' ratio is between 8-15, suggesting indeterminate LV Filling pressure. - Aortic valve: Structurally normal valve. Trileaflet. Transvalvular velocity was minimally increased, most of which is due to intracavitary gradient. There was no stenosis. There was no regurgitation. - Left atrium: The atrium was at the upper limits of normal in size. - Tricuspid valve: TR jet not adequate to measure RVSP.   Nuclear stress test 02/23/2014  - Left ventricle: The cavity size was normal. There was moderate concentric hypertrophy. Systolic function was vigorous. The estimated ejection fraction was in the range of 65% to 70%. Wall motion was normal; there were no regional wall motion abnormalities. Doppler parameters are consistent with abnormal left ventricular relaxation (grade 1 diastolic dysfunction). The E/e&' ratio is between 8-15, suggesting indeterminate LV Filling pressure. - Aortic valve: Structurally normal valve. Trileaflet. Transvalvular velocity was minimally increased, most of which is due to intracavitary gradient. There was no stenosis. There was no regurgitation. - Left atrium: The atrium was at the upper limits of normal in size. - Tricuspid valve: TR jet not adequate to measure RVSP.  Renal arterial US: 09/29/2014 Normal caliber abdominal aorta. Normal and symmetrical kidney size. Small simple cysts in the right kidney, described above. No evidence of hydronephrosis is noted in the kidneys. The IVC and renal veins are patent. >60% ostial right renal artery stenosis. Normal left renal artery. Suggest PV consult if clinically indicated     Assessment and  Plan  Atrial tachycardia Sinus bradycardia Hypertension HFpEF  1. HFpEF - moderate and worsening LVH on the most recent echo,  Euvolemic, continue lasix 20 mg po daily, stable Crea 1.3- 1.4. The patient actually lost weight.   2. Left atrial tachycardia - no evidence of atrial  fibrillation. Resolved on Flecainide. We will continue, in SR today, no palpitations since January 2015.  3. HTN - we added Imdur at the last visit, that gave her headaches, we increased lisinopril to 20 mg po daily, in January 2016 BP up to 200, she was found to have > 6- % stenosis in the right renal artery. Normal left renal artery, PV consult was recommended. Her nephrologist wants to manage medically at first. We will follow.  Follow up in 6 months.   Dorothy Spark 10/17/2014

## 2014-10-17 NOTE — Patient Instructions (Signed)
Your physician recommends that you continue on your current medications as directed. Please refer to the Current Medication list given to you today.     Your physician wants you to follow-up in: 6 MONTHS WITH DR NELSON You will receive a reminder letter in the mail two months in advance. If you don't receive a letter, please call our office to schedule the follow-up appointment.  

## 2014-10-25 ENCOUNTER — Other Ambulatory Visit (INDEPENDENT_AMBULATORY_CARE_PROVIDER_SITE_OTHER): Payer: Medicare Other

## 2014-10-25 DIAGNOSIS — E559 Vitamin D deficiency, unspecified: Secondary | ICD-10-CM | POA: Diagnosis not present

## 2014-10-25 DIAGNOSIS — E785 Hyperlipidemia, unspecified: Secondary | ICD-10-CM | POA: Diagnosis not present

## 2014-10-25 DIAGNOSIS — I10 Essential (primary) hypertension: Secondary | ICD-10-CM | POA: Diagnosis not present

## 2014-10-25 DIAGNOSIS — E039 Hypothyroidism, unspecified: Secondary | ICD-10-CM | POA: Diagnosis not present

## 2014-10-25 DIAGNOSIS — D509 Iron deficiency anemia, unspecified: Secondary | ICD-10-CM

## 2014-10-25 LAB — POCT CBC
Granulocyte percent: 60.2 %G (ref 37–80)
HCT, POC: 33.6 % — AB (ref 37.7–47.9)
HEMOGLOBIN: 10.5 g/dL — AB (ref 12.2–16.2)
Lymph, poc: 1.2 (ref 0.6–3.4)
MCH, POC: 25.9 pg — AB (ref 27–31.2)
MCHC: 31.2 g/dL — AB (ref 31.8–35.4)
MCV: 83.1 fL (ref 80–97)
MPV: 7.7 fL (ref 0–99.8)
PLATELET COUNT, POC: 185 10*3/uL (ref 142–424)
POC Granulocyte: 2.3 (ref 2–6.9)
POC LYMPH PERCENT: 31.3 %L (ref 10–50)
RBC: 4.05 M/uL (ref 4.04–5.48)
RDW, POC: 15 %
WBC: 3.9 10*3/uL — AB (ref 4.6–10.2)

## 2014-10-25 NOTE — Progress Notes (Signed)
Lab only 

## 2014-10-26 LAB — NMR, LIPOPROFILE
CHOLESTEROL: 128 mg/dL (ref 100–199)
HDL CHOLESTEROL BY NMR: 48 mg/dL (ref >39)
HDL Particle Number: 25.6 umol/L — ABNORMAL LOW (ref >=30.5)
LDL Particle Number: 1089 nmol/L — ABNORMAL HIGH (ref <1000)
LDL Size: 20.7 nm (ref >20.5)
LDL-C: 65 mg/dL (ref 0–99)
LP-IR SCORE: 46 — AB (ref <=45)
SMALL LDL PARTICLE NUMBER: 567 nmol/L — AB (ref <=527)
Triglycerides by NMR: 74 mg/dL (ref 0–149)

## 2014-10-26 LAB — BMP8+EGFR
BUN/Creatinine Ratio: 14 (ref 11–26)
BUN: 22 mg/dL (ref 8–27)
CALCIUM: 9 mg/dL (ref 8.7–10.3)
CO2: 28 mmol/L (ref 18–29)
Chloride: 94 mmol/L — ABNORMAL LOW (ref 97–108)
Creatinine, Ser: 1.58 mg/dL — ABNORMAL HIGH (ref 0.57–1.00)
GFR calc Af Amer: 36 mL/min/{1.73_m2} — ABNORMAL LOW (ref >59)
GFR, EST NON AFRICAN AMERICAN: 31 mL/min/{1.73_m2} — AB (ref >59)
GLUCOSE: 100 mg/dL — AB (ref 65–99)
POTASSIUM: 3.9 mmol/L (ref 3.5–5.2)
SODIUM: 137 mmol/L (ref 134–144)

## 2014-10-26 LAB — THYROID PANEL WITH TSH
Free Thyroxine Index: 3.3 (ref 1.2–4.9)
T3 Uptake Ratio: 29 % (ref 24–39)
T4 TOTAL: 11.4 ug/dL (ref 4.5–12.0)
TSH: 2.71 u[IU]/mL (ref 0.450–4.500)

## 2014-10-26 LAB — HEPATIC FUNCTION PANEL
ALK PHOS: 79 IU/L (ref 39–117)
ALT: 17 IU/L (ref 0–32)
AST: 20 IU/L (ref 0–40)
Albumin: 4.1 g/dL (ref 3.5–4.8)
Bilirubin Total: 0.7 mg/dL (ref 0.0–1.2)
Bilirubin, Direct: 0.21 mg/dL (ref 0.00–0.40)
Total Protein: 6.7 g/dL (ref 6.0–8.5)

## 2014-10-26 LAB — VITAMIN D 25 HYDROXY (VIT D DEFICIENCY, FRACTURES): VIT D 25 HYDROXY: 68 ng/mL (ref 30.0–100.0)

## 2014-10-27 ENCOUNTER — Telehealth: Payer: Self-pay | Admitting: Family Medicine

## 2014-10-27 ENCOUNTER — Encounter: Payer: Self-pay | Admitting: Family Medicine

## 2014-10-27 DIAGNOSIS — I129 Hypertensive chronic kidney disease with stage 1 through stage 4 chronic kidney disease, or unspecified chronic kidney disease: Secondary | ICD-10-CM | POA: Diagnosis not present

## 2014-10-27 DIAGNOSIS — E785 Hyperlipidemia, unspecified: Secondary | ICD-10-CM | POA: Diagnosis not present

## 2014-10-27 DIAGNOSIS — N183 Chronic kidney disease, stage 3 (moderate): Secondary | ICD-10-CM | POA: Diagnosis not present

## 2014-10-27 DIAGNOSIS — N2889 Other specified disorders of kidney and ureter: Secondary | ICD-10-CM | POA: Diagnosis not present

## 2014-10-27 NOTE — Telephone Encounter (Signed)
-----   Message from Chipper Herb, MD sent at 10/26/2014  7:20 AM EST ----- The blood sugar is 100. The creatinine, the most important kidney function test remains elevated at 1.58 and this is where it was about 6 months ago. The electrolytes including potassium are within normal limits except the chloride is slightly decreased. All liver function tests are within normal limits Cholesterol numbers with advanced lipid testing have a total LDL particle number that is slightly elevated at 1089. This number should be less than 1000. 4 months ago it was 787. The LDL C is good at 65 and the triglycerides are good at 74-----the patient should continue with her atorvastatin and try to do better with her diet and exercise All thyroid function tests are within normal limits----she should continue with her current thyroid medication The vitamin D level is good and she should continue with her current treatment

## 2014-10-27 NOTE — Telephone Encounter (Signed)
Patient aware.

## 2014-10-30 ENCOUNTER — Encounter: Payer: Self-pay | Admitting: Family Medicine

## 2014-10-30 ENCOUNTER — Ambulatory Visit (INDEPENDENT_AMBULATORY_CARE_PROVIDER_SITE_OTHER): Payer: Medicare Other

## 2014-10-30 ENCOUNTER — Ambulatory Visit (INDEPENDENT_AMBULATORY_CARE_PROVIDER_SITE_OTHER): Payer: Medicare Other | Admitting: Family Medicine

## 2014-10-30 VITALS — BP 190/90 | HR 58 | Temp 97.4°F | Ht 65.0 in | Wt 130.0 lb

## 2014-10-30 DIAGNOSIS — I10 Essential (primary) hypertension: Secondary | ICD-10-CM | POA: Diagnosis not present

## 2014-10-30 DIAGNOSIS — N189 Chronic kidney disease, unspecified: Secondary | ICD-10-CM

## 2014-10-30 DIAGNOSIS — Z780 Asymptomatic menopausal state: Secondary | ICD-10-CM

## 2014-10-30 DIAGNOSIS — E785 Hyperlipidemia, unspecified: Secondary | ICD-10-CM | POA: Diagnosis not present

## 2014-10-30 DIAGNOSIS — D649 Anemia, unspecified: Secondary | ICD-10-CM | POA: Diagnosis not present

## 2014-10-30 DIAGNOSIS — R5383 Other fatigue: Secondary | ICD-10-CM

## 2014-10-30 DIAGNOSIS — E559 Vitamin D deficiency, unspecified: Secondary | ICD-10-CM

## 2014-10-30 DIAGNOSIS — Z13820 Encounter for screening for osteoporosis: Secondary | ICD-10-CM | POA: Diagnosis not present

## 2014-10-30 NOTE — Progress Notes (Signed)
 Subjective:    Patient ID: [name], female    DOB: 03/26/1934, 81 y.o.   MRN: [id number]  HPI Pt here for follow up and management of chronic medical problems which includes hypertension, hyperlipidemia, and anemia. She is taking medications regularly. Pt is following Dr Mattingly with [facility] for her BP and kidney functions. He will see her again in May. Her recent lab work was reviewed with her today. It is important to note that her hemoglobin has decreased since it was last checked. She is currently taking iron over-the-counter as directed by Dr. Mattingly. She complains of increased fatigue and weight loss. She also complains of decreased energy. She has not been walking as much because of the winter months in the weather and hopes to start doing more of this with the warmer weather. There was nothing in her lab work other than her increased creatinine returned to her decreased hemoglobin that could indicate reasons for her fatigue. Her thyroid tests were normal. Her cholesterol numbers were stable other than the LDL particle number being increased. She was made aware of all this prior to the physical exam.        Patient Active Problem List   Diagnosis Date Noted  . Chronic diastolic heart failure 04/03/2014  . Acute diastolic heart failure 04/03/2014  . Hypertensive urgency 04/02/2014  . Headache 04/02/2014  . CKD (chronic kidney disease) stage 3, GFR 30-59 ml/min 04/02/2014  . Paroxysmal atrial fibrillation 01/25/2014  . Hypokalemia 07/19/2013  . Anemia 07/19/2013  . Bilateral lower extremity edema 06/30/2013  . Hypertension with fluid overload 06/30/2013  . Need for prophylactic vaccination and inoculation against influenza 05/31/2013  . Pedal edema 05/10/2013  . Varicose veins of lower extremities with other complications 05/03/2013  . ABNORMAL STRESS ELECTROCARDIOGRAM 01/23/2010  . HYPOTHYROIDISM 12/18/2009  . HYPERLIPIDEMIA 12/18/2009  . HYPERTENSION  12/18/2009  . RENAL INSUFFICIENCY, CHRONIC 12/18/2009  . ARTHRITIS 12/18/2009   Outpatient Encounter Prescriptions as of 10/30/2014  Medication Sig  . aspirin EC 81 MG tablet Take 81 mg by mouth every other day.  . atenolol-chlorthalidone (TENORETIC) 50-25 MG per tablet Take 1 tablet by mouth daily.  . calcium-vitamin D (OSCAL WITH D) 500-200 MG-UNIT per tablet Take 1 tablet by mouth daily.    . cloNIDine (CATAPRES) 0.1 MG tablet Take 0.1 mg by mouth daily. 1 tablet as needed for systolic blood pressure over 180  . flecainide (TAMBOCOR) 50 MG tablet Take 1 tablet (50 mg total) by mouth 2 (two) times daily.  . hydrALAZINE (APRESOLINE) 25 MG tablet Take 2 tablets (50 mg total) by mouth 2 (two) times daily. (Patient taking differently: Take 25 mg by mouth 3 (three) times daily. )  . levothyroxine (SYNTHROID, LEVOTHROID) 75 MCG tablet Take 1 tablet (75 mcg total) by mouth daily.  . lisinopril (PRINIVIL,ZESTRIL) 20 MG tablet Take 1 tablet by mouth 2 (two) times daily.   . Omega-3 Fatty Acids (FISH OIL) 1200 MG CAPS Take 1,200 mg by mouth daily.  . [DISCONTINUED] ALPRAZolam (XANAX) 0.5 MG tablet Take 1 tablet (0.5 mg total) by mouth at bedtime as needed for anxiety.  . [DISCONTINUED] atorvastatin (LIPITOR) 20 MG tablet TAKE 0.5 TABLETS (10 MG TOTAL) BY MOUTH DAILY.  . [DISCONTINUED] fluticasone (FLONASE) 50 MCG/ACT nasal spray   . [DISCONTINUED] LORazepam (ATIVAN) 0.5 MG tablet Take 1 tablet (0.5 mg total) by mouth 2 (two) times daily as needed for anxiety.    Review of Systems  Constitutional: Positive for appetite change (  decrease) and fatigue.  HENT: Negative.   Eyes: Negative.   Respiratory: Negative.   Cardiovascular: Negative.   Gastrointestinal: Negative.   Endocrine: Negative.   Genitourinary: Negative.   Musculoskeletal: Negative.   Skin: Negative.   Allergic/Immunologic: Negative.   Neurological: Negative.   Hematological: Negative.   Psychiatric/Behavioral: Negative.          Objective:   Physical Exam  Constitutional: She is oriented to person, place, and time. She appears well-developed and well-nourished. No distress.  HENT:  Head: Normocephalic and atraumatic.  Right Ear: External ear normal.  Left Ear: External ear normal.  Nose: Nose normal.  Mouth/Throat: Oropharynx is clear and moist.  Eyes: Conjunctivae and EOM are normal. Pupils are equal, round, and reactive to light. Right eye exhibits no discharge. Left eye exhibits no discharge. No scleral icterus.  Neck: Normal range of motion. Neck supple. No thyromegaly present.  No carotid bruits anterior cervical adenopathy or thyromegaly  Cardiovascular: Normal rate, regular rhythm, normal heart sounds and intact distal pulses.   No murmur heard. At 60/m  Pulmonary/Chest: Effort normal and breath sounds normal. No respiratory distress. She has no wheezes. She has no rales. She exhibits no tenderness.  Clear anteriorly and posteriorly  Abdominal: Soft. Bowel sounds are normal. She exhibits no mass. There is no tenderness. There is no rebound and no guarding.  The abdomen was soft without masses or bruits. There is no inguinal adenopathy.  Musculoskeletal: Normal range of motion. She exhibits no edema.  Lymphadenopathy:    She has no cervical adenopathy.  Neurological: She is alert and oriented to person, place, and time. She has normal reflexes. No cranial nerve deficit.  Skin: Skin is warm and dry. No rash noted.  Psychiatric: She has a normal mood and affect. Her behavior is normal. Judgment and thought content normal.  Nursing note and vitals reviewed.  BP 192/75 mmHg  Pulse 58  Temp(Src) 97.4 F (36.3 C) (Oral)  Ht 5' 5" (1.651 m)  Wt 130 lb (58.968 kg)  BMI 21.63 kg/m2        Assessment & Plan:  1. Vitamin D deficiency -For now the patient should continue with her current vitamin D dosing which is 1000 of D3 daily. She will discuss whether she needs to continue to take this when she sees  Dr. Mattingly in 2 months. - DG Bone Density; Future  2. Essential hypertension -She should continue to monitor her blood pressures as she is doing and take the clonidine when it goes up to a certain level as directed by Dr. Mattingly. She should take her recorded readings to the visit with him for review. The most recent ones have been scanned into the record. - POCT CBC; Future - BMP8+EGFR; Future - Vitamin B12; Future  3. HLD (hyperlipidemia) -Presumed exercise and continue with atorvastatin  4. Anemia, unspecified anemia type -Continue with iron medication and check B12 level and repeat CBC and return FOBT as directed  5. CKD (chronic kidney disease), unspecified stage -Continue follow-up with the nephrologist - POCT CBC; Future - BMP8+EGFR; Future - Vitamin B12; Future  6. Screening for osteoporosis -Walking and increased exercise and being careful not to fall - DG Bone Density; Future  7. Postmenopausal - DG Bone Density; Future  8. Other fatigue -Increase exercise and continued to take iron preparation - POCT CBC; Future - BMP8+EGFR; Future - Vitamin B12; Future  Patient Instructions                         Medicare Annual Wellness Visit  [place] and the medical providers at [facility] strive to bring you the best medical care.  In doing so we not only want to address your current medical conditions and concerns but also to detect new conditions early and prevent illness, disease and health-related problems.    Medicare offers a yearly Wellness Visit which allows our clinical staff to assess your need for preventative services including immunizations, lifestyle education, counseling to decrease risk of preventable diseases and screening for fall risk and other medical concerns.    This visit is provided free of charge (no copay) for all Medicare recipients. The clinical pharmacists at [facility] have begun to conduct  these Wellness Visits which will also include a thorough review of all your medications.    As you primary medical provider recommend that you make an appointment for your Annual Wellness Visit if you have not done so already this year.  You may set up this appointment before you leave today or you may call back [phone]) and schedule an appointment.  Please make sure when you call that you mention that you are scheduling your Annual Wellness Visit with the clinical pharmacist so that the appointment may be made for the proper length of time.     Continue current medications. Continue good therapeutic lifestyle changes which include good diet and exercise. Fall precautions discussed with patient. If an FOBT was given today- please return it to our front desk. If you are over 50 years old - you may need Prevnar 13 or the adult Pneumonia vaccine.  Flu Shots are still available at our office. If you still haven't had one please call to set up a nurse visit to get one.   After your visit with us today you will receive a survey in the mail or online from [employer] regarding your care with us. Please take a moment to fill this out. Your feedback is very important to us as you can help us better understand your patient needs as well as improve your experience and satisfaction. WE CARE ABOUT YOU!!!  The patient should continue to monitor blood pressures regularly at home and take the clonidine as was discussed by the nephrologist She should continue her iron and consider taking a multivitamin She should return the FOBT As the weather is warming she should try to begin her walking regimen again as this may improve her strength and endurance and help her to feel better She should come by and get a BMP CBC and B12 level prior to her visit with Dr. Mattingly   Don W. Moore MD   

## 2014-10-30 NOTE — Patient Instructions (Addendum)
Medicare Annual Wellness Visit  [place] and the medical providers at [facility] strive to bring you the best medical care.  In doing so we not only want to address your current medical conditions and concerns but also to detect new conditions early and prevent illness, disease and health-related problems.    Medicare offers a yearly Wellness Visit which allows our clinical staff to assess your need for preventative services including immunizations, lifestyle education, counseling to decrease risk of preventable diseases and screening for fall risk and other medical concerns.    This visit is provided free of charge (no copay) for all Medicare recipients. The clinical pharmacists at [facility] have begun to conduct these Wellness Visits which will also include a thorough review of all your medications.    As you primary medical provider recommend that you make an appointment for your Annual Wellness Visit if you have not done so already this year.  You may set up this appointment before you leave today or you may call back [phone] and schedule an appointment.  Please make sure when you call that you mention that you are scheduling your Annual Wellness Visit with the clinical pharmacist so that the appointment may be made for the proper length of time.     Continue current medications. Continue good therapeutic lifestyle changes which include good diet and exercise. Fall precautions discussed with patient. If an FOBT was given today- please return it to our front desk. If you are over 53 years old - you may need Prevnar 8 or the adult Pneumonia vaccine.  Flu Shots are still available at our office. If you still haven't had one please call to set up a nurse visit to get one.   After your visit with [place] today you will receive a survey in the mail or online from [employer] regarding your care with [place]. Please take a moment to  fill this out. Your feedback is very important to [place] as you can help [place] better understand your patient needs as well as improve your experience and satisfaction. WE CARE ABOUT YOU!!!  The patient should continue to monitor blood pressures regularly at home and take the clonidine as was discussed by the nephrologist She should continue her iron and consider taking a multivitamin She should return the FOBT As the weather is warming she should try to begin her walking regimen again as this may improve her strength and endurance and help her to feel better She should come by and get a BMP CBC and B12 level prior to her visit with Dr. Mercy Moore

## 2014-11-01 ENCOUNTER — Other Ambulatory Visit: Payer: Medicare Other

## 2014-11-01 DIAGNOSIS — Z1212 Encounter for screening for malignant neoplasm of rectum: Secondary | ICD-10-CM

## 2014-11-01 NOTE — Progress Notes (Signed)
LAB ONLY 

## 2014-11-02 LAB — FECAL OCCULT BLOOD, IMMUNOCHEMICAL: FECAL OCCULT BLD: POSITIVE — AB

## 2014-11-06 ENCOUNTER — Telehealth: Payer: Self-pay | Admitting: Family Medicine

## 2014-11-06 NOTE — Telephone Encounter (Signed)
Spoke with patient and she is aware that she still needs to repeat cbc and FOBT the week of March 30th.

## 2014-11-07 ENCOUNTER — Other Ambulatory Visit: Payer: Self-pay | Admitting: *Deleted

## 2014-11-07 DIAGNOSIS — R195 Other fecal abnormalities: Secondary | ICD-10-CM

## 2014-11-07 DIAGNOSIS — R110 Nausea: Secondary | ICD-10-CM

## 2014-11-07 DIAGNOSIS — R634 Abnormal weight loss: Secondary | ICD-10-CM

## 2014-11-09 ENCOUNTER — Ambulatory Visit (HOSPITAL_COMMUNITY)
Admission: RE | Admit: 2014-11-09 | Discharge: 2014-11-09 | Disposition: A | Discharge status: Home / Self Care | Payer: Medicare Other | Source: Ambulatory Visit | Attending: Family Medicine | Admitting: Family Medicine

## 2014-11-09 DIAGNOSIS — R634 Abnormal weight loss: Secondary | ICD-10-CM | POA: Insufficient documentation

## 2014-11-09 DIAGNOSIS — R110 Nausea: Secondary | ICD-10-CM

## 2014-11-09 DIAGNOSIS — K5730 Diverticulosis of large intestine without perforation or abscess without bleeding: Secondary | ICD-10-CM | POA: Diagnosis not present

## 2014-11-09 DIAGNOSIS — R195 Other fecal abnormalities: Secondary | ICD-10-CM | POA: Insufficient documentation

## 2014-11-09 DIAGNOSIS — E279 Disorder of adrenal gland, unspecified: Secondary | ICD-10-CM | POA: Diagnosis not present

## 2014-11-13 ENCOUNTER — Other Ambulatory Visit: Payer: Self-pay | Admitting: *Deleted

## 2014-11-13 DIAGNOSIS — R634 Abnormal weight loss: Secondary | ICD-10-CM

## 2014-11-13 DIAGNOSIS — R195 Other fecal abnormalities: Secondary | ICD-10-CM

## 2014-11-13 DIAGNOSIS — D649 Anemia, unspecified: Secondary | ICD-10-CM

## 2014-11-15 ENCOUNTER — Other Ambulatory Visit (INDEPENDENT_AMBULATORY_CARE_PROVIDER_SITE_OTHER): Payer: Medicare Other

## 2014-11-15 DIAGNOSIS — I10 Essential (primary) hypertension: Secondary | ICD-10-CM | POA: Diagnosis not present

## 2014-11-15 DIAGNOSIS — R5383 Other fatigue: Secondary | ICD-10-CM

## 2014-11-15 DIAGNOSIS — N189 Chronic kidney disease, unspecified: Secondary | ICD-10-CM

## 2014-11-15 DIAGNOSIS — Z1212 Encounter for screening for malignant neoplasm of rectum: Secondary | ICD-10-CM | POA: Diagnosis not present

## 2014-11-15 LAB — POCT CBC
GRANULOCYTE PERCENT: 67.3 % (ref 37–80)
HCT, POC: 34.6 % — AB (ref 37.7–47.9)
HEMOGLOBIN: 10.8 g/dL — AB (ref 12.2–16.2)
Lymph, poc: 1.1 (ref 0.6–3.4)
MCH, POC: 26.4 pg — AB (ref 27–31.2)
MCHC: 31.1 g/dL — AB (ref 31.8–35.4)
MCV: 84.9 fL (ref 80–97)
MPV: 6.4 fL (ref 0–99.8)
POC GRANULOCYTE: 2.7 (ref 2–6.9)
POC LYMPH PERCENT: 27 %L (ref 10–50)
Platelet Count, POC: 198 10*3/uL (ref 142–424)
RBC: 4.08 M/uL (ref 4.04–5.48)
RDW, POC: 16 %
WBC: 4 10*3/uL — AB (ref 4.6–10.2)

## 2014-11-15 NOTE — Progress Notes (Signed)
Lab only 

## 2014-11-16 LAB — BMP8+EGFR
BUN/Creatinine Ratio: 13 (ref 11–26)
BUN: 19 mg/dL (ref 8–27)
CHLORIDE: 96 mmol/L — AB (ref 97–108)
CO2: 27 mmol/L (ref 18–29)
Calcium: 9.2 mg/dL (ref 8.7–10.3)
Creatinine, Ser: 1.45 mg/dL — ABNORMAL HIGH (ref 0.57–1.00)
GFR calc Af Amer: 40 mL/min/{1.73_m2} — ABNORMAL LOW (ref >59)
GFR calc non Af Amer: 35 mL/min/{1.73_m2} — ABNORMAL LOW (ref >59)
GLUCOSE: 116 mg/dL — AB (ref 65–99)
Potassium: 4 mmol/L (ref 3.5–5.2)
Sodium: 138 mmol/L (ref 134–144)

## 2014-11-16 LAB — VITAMIN B12: VITAMIN B 12: 629 pg/mL (ref 211–946)

## 2014-11-17 LAB — FECAL OCCULT BLOOD, IMMUNOCHEMICAL: FECAL OCCULT BLD: NEGATIVE

## 2014-11-27 DIAGNOSIS — R195 Other fecal abnormalities: Secondary | ICD-10-CM | POA: Diagnosis not present

## 2014-11-27 DIAGNOSIS — D509 Iron deficiency anemia, unspecified: Secondary | ICD-10-CM | POA: Diagnosis not present

## 2014-11-27 DIAGNOSIS — R634 Abnormal weight loss: Secondary | ICD-10-CM | POA: Diagnosis not present

## 2014-12-12 ENCOUNTER — Other Ambulatory Visit (INDEPENDENT_AMBULATORY_CARE_PROVIDER_SITE_OTHER): Payer: Medicare Other

## 2014-12-12 DIAGNOSIS — E039 Hypothyroidism, unspecified: Secondary | ICD-10-CM | POA: Diagnosis not present

## 2014-12-12 DIAGNOSIS — D649 Anemia, unspecified: Secondary | ICD-10-CM

## 2014-12-12 DIAGNOSIS — I10 Essential (primary) hypertension: Secondary | ICD-10-CM | POA: Diagnosis not present

## 2014-12-12 DIAGNOSIS — N189 Chronic kidney disease, unspecified: Secondary | ICD-10-CM | POA: Diagnosis not present

## 2014-12-12 DIAGNOSIS — E559 Vitamin D deficiency, unspecified: Secondary | ICD-10-CM

## 2014-12-12 DIAGNOSIS — E785 Hyperlipidemia, unspecified: Secondary | ICD-10-CM

## 2014-12-12 LAB — POCT CBC
Granulocyte percent: 60.7 %G (ref 37–80)
HCT, POC: 33.5 % — AB (ref 37.7–47.9)
Hemoglobin: 11 g/dL — AB (ref 12.2–16.2)
LYMPH, POC: 1.1 (ref 0.6–3.4)
MCH, POC: 28.3 pg (ref 27–31.2)
MCHC: 32.9 g/dL (ref 31.8–35.4)
MCV: 85.9 fL (ref 80–97)
MPV: 8.1 fL (ref 0–99.8)
POC GRANULOCYTE: 2.1 (ref 2–6.9)
POC LYMPH PERCENT: 32.7 %L (ref 10–50)
Platelet Count, POC: 165 10*3/uL (ref 142–424)
RBC: 3.9 M/uL — AB (ref 4.04–5.48)
RDW, POC: 15 %
WBC: 3.4 10*3/uL — AB (ref 4.6–10.2)

## 2014-12-13 DIAGNOSIS — D509 Iron deficiency anemia, unspecified: Secondary | ICD-10-CM | POA: Diagnosis not present

## 2014-12-13 DIAGNOSIS — K5730 Diverticulosis of large intestine without perforation or abscess without bleeding: Secondary | ICD-10-CM | POA: Diagnosis not present

## 2014-12-13 DIAGNOSIS — K641 Second degree hemorrhoids: Secondary | ICD-10-CM | POA: Diagnosis not present

## 2014-12-13 DIAGNOSIS — R195 Other fecal abnormalities: Secondary | ICD-10-CM | POA: Diagnosis not present

## 2014-12-13 LAB — NMR, LIPOPROFILE
Cholesterol: 143 mg/dL (ref 100–199)
HDL Cholesterol by NMR: 54 mg/dL (ref >39)
HDL PARTICLE NUMBER: 31 umol/L (ref >=30.5)
LDL Particle Number: 791 nmol/L (ref <1000)
LDL Size: 21.2 nm (ref >20.5)
LDL-C: 67 mg/dL (ref 0–99)
LP-IR Score: <25 (ref <=45)
SMALL LDL PARTICLE NUMBER: 217 nmol/L (ref <=527)
TRIGLYCERIDES BY NMR: 111 mg/dL (ref 0–149)

## 2014-12-13 LAB — BMP8+EGFR
BUN / CREAT RATIO: 16 (ref 11–26)
BUN: 21 mg/dL (ref 8–27)
CO2: 29 mmol/L (ref 18–29)
Calcium: 9 mg/dL (ref 8.7–10.3)
Chloride: 96 mmol/L — ABNORMAL LOW (ref 97–108)
Creatinine, Ser: 1.34 mg/dL — ABNORMAL HIGH (ref 0.57–1.00)
GFR calc non Af Amer: 38 mL/min/{1.73_m2} — ABNORMAL LOW (ref >59)
GFR, EST AFRICAN AMERICAN: 44 mL/min/{1.73_m2} — AB (ref >59)
Glucose: 123 mg/dL — ABNORMAL HIGH (ref 65–99)
POTASSIUM: 3.9 mmol/L (ref 3.5–5.2)
Sodium: 139 mmol/L (ref 134–144)

## 2014-12-13 LAB — HEPATIC FUNCTION PANEL
ALT: 25 IU/L (ref 0–32)
AST: 29 IU/L (ref 0–40)
Albumin: 4.2 g/dL (ref 3.5–4.8)
Alkaline Phosphatase: 71 IU/L (ref 39–117)
BILIRUBIN TOTAL: 0.4 mg/dL (ref 0.0–1.2)
Bilirubin, Direct: 0.14 mg/dL (ref 0.00–0.40)
Total Protein: 6.6 g/dL (ref 6.0–8.5)

## 2014-12-13 LAB — THYROID PANEL WITH TSH
Free Thyroxine Index: 2.7 (ref 1.2–4.9)
T3 Uptake Ratio: 28 % (ref 24–39)
T4, Total: 9.5 ug/dL (ref 4.5–12.0)
TSH: 3.69 u[IU]/mL (ref 0.450–4.500)

## 2014-12-13 LAB — VITAMIN D 25 HYDROXY (VIT D DEFICIENCY, FRACTURES): Vit D, 25-Hydroxy: 53.6 ng/mL (ref 30.0–100.0)

## 2014-12-14 ENCOUNTER — Encounter: Payer: Medicare Other | Admitting: Family Medicine

## 2014-12-21 DIAGNOSIS — N2889 Other specified disorders of kidney and ureter: Secondary | ICD-10-CM | POA: Diagnosis not present

## 2014-12-21 DIAGNOSIS — N183 Chronic kidney disease, stage 3 (moderate): Secondary | ICD-10-CM | POA: Diagnosis not present

## 2014-12-21 DIAGNOSIS — E785 Hyperlipidemia, unspecified: Secondary | ICD-10-CM | POA: Diagnosis not present

## 2014-12-21 DIAGNOSIS — I129 Hypertensive chronic kidney disease with stage 1 through stage 4 chronic kidney disease, or unspecified chronic kidney disease: Secondary | ICD-10-CM | POA: Diagnosis not present

## 2014-12-25 ENCOUNTER — Encounter: Payer: Self-pay | Admitting: Physician Assistant

## 2014-12-25 ENCOUNTER — Ambulatory Visit (INDEPENDENT_AMBULATORY_CARE_PROVIDER_SITE_OTHER): Payer: Medicare Other | Admitting: Physician Assistant

## 2014-12-25 VITALS — BP 178/67 | HR 54 | Temp 98.4°F | Ht 65.0 in | Wt 129.0 lb

## 2014-12-25 DIAGNOSIS — T148 Other injury of unspecified body region: Secondary | ICD-10-CM | POA: Diagnosis not present

## 2014-12-25 DIAGNOSIS — T148XXA Other injury of unspecified body region, initial encounter: Secondary | ICD-10-CM

## 2014-12-25 NOTE — Patient Instructions (Signed)
Hematoma A hematoma is a collection of blood. The collection of blood can turn into a hard, painful lump under the skin. Your skin may turn blue or yellow if the hematoma is close to the surface of the skin. Most hematomas get better in a few days to weeks. Some hematomas are serious and need medical care. Hematomas can be very small or very big. HOME CARE  Apply ice to the injured area:  Put ice in a plastic bag.  Place a towel between your skin and the bag.  Leave the ice on for 20 minutes, 2-3 times a day for the first 1 to 2 days.  After the first 2 days, switch to using warm packs on the injured area.  Raise (elevate) the injured area to lessen pain and puffiness (swelling). You may also wrap the area with an elastic bandage. Make sure the bandage is not wrapped too tight.  If you have a painful hematoma on your leg or foot, you may use crutches for a couple days.  Only take medicines as told by your doctor. GET HELP RIGHT AWAY IF:   Your pain gets worse.  Your pain is not controlled with medicine.  You have a fever.  Your puffiness gets worse.  Your skin turns more blue or yellow.  Your skin over the hematoma breaks or starts bleeding.  Your hematoma is in your chest or belly (abdomen) and you are short of breath, feel weak, or have a change in consciousness.  Your hematoma is on your scalp and you have a headache that gets worse or a change in alertness or consciousness. MAKE SURE YOU:   Understand these instructions.  Will watch your condition.  Will get help right away if you are not doing well or get worse. Document Released: 09/11/2004 Document Revised: 04/06/2013 Document Reviewed: 01/12/2013 [facility] Patient Information 2015 [place], [place]. This information is not intended to replace advice given to you by your health care provider. Make sure you discuss any questions you have with your health care provider.

## 2014-12-25 NOTE — Progress Notes (Signed)
   Subjective:    Patient ID: [name], female    DOB: 02/19/1934, 81 y.o.   MRN: [id number]  HPI 81 y/o female presents with "sore" on left leg x 1 week ago. She was working out in the yard and hit her leg. Raised dark area has been there since. Mild soreness. Has not increased in size. Bruises easily. Has not tried heat or ice    Review of Systems  Skin: Positive for color change.       DArk Lesion on LLE , medial shin, mild pain   Hematological: Bruises/bleeds easily.       Objective:   Physical Exam  Skin:  Approximately 6cm x 3 cm irregular shaped ecchymotic area with central superficial localized hematoma. No infection suspected.   Nursing note and vitals reviewed.         Assessment & Plan:  1. Superficial hematoma  using warm packs on the injured area.  Raise (elevate) the injured area to lessen pain and puffiness (swelling). You may also wrap the area with an elastic bandage. Make sure the bandage is not wrapped too tight.  If you have a painful hematoma on your leg or foot, you may use crutches for a couple days.Do not puncture or try to manually open the lesion    RTC if pain worsens, red streaking occurs, or swelling develops.   Rylyn Zawistowski A. Benjamin Stain PA-C

## 2014-12-29 ENCOUNTER — Other Ambulatory Visit: Payer: Self-pay | Admitting: Family Medicine

## 2015-01-22 DIAGNOSIS — D509 Iron deficiency anemia, unspecified: Secondary | ICD-10-CM | POA: Diagnosis not present

## 2015-01-24 DIAGNOSIS — H2513 Age-related nuclear cataract, bilateral: Secondary | ICD-10-CM | POA: Diagnosis not present

## 2015-01-24 DIAGNOSIS — H11823 Conjunctivochalasis, bilateral: Secondary | ICD-10-CM | POA: Diagnosis not present

## 2015-01-24 DIAGNOSIS — H43393 Other vitreous opacities, bilateral: Secondary | ICD-10-CM | POA: Diagnosis not present

## 2015-01-26 DIAGNOSIS — Z1231 Encounter for screening mammogram for malignant neoplasm of breast: Secondary | ICD-10-CM | POA: Diagnosis not present

## 2015-01-26 LAB — HM MAMMOGRAPHY

## 2015-01-31 DIAGNOSIS — R634 Abnormal weight loss: Secondary | ICD-10-CM | POA: Diagnosis not present

## 2015-01-31 DIAGNOSIS — D500 Iron deficiency anemia secondary to blood loss (chronic): Secondary | ICD-10-CM | POA: Diagnosis not present

## 2015-01-31 DIAGNOSIS — R195 Other fecal abnormalities: Secondary | ICD-10-CM | POA: Diagnosis not present

## 2015-02-02 ENCOUNTER — Other Ambulatory Visit: Payer: Self-pay | Admitting: Internal Medicine

## 2015-02-22 ENCOUNTER — Other Ambulatory Visit (INDEPENDENT_AMBULATORY_CARE_PROVIDER_SITE_OTHER): Payer: Medicare Other

## 2015-02-22 DIAGNOSIS — R5383 Other fatigue: Secondary | ICD-10-CM

## 2015-02-22 LAB — POCT CBC
GRANULOCYTE PERCENT: 62.4 % (ref 37–80)
HEMATOCRIT: 33.3 % — AB (ref 37.7–47.9)
Hemoglobin: 10.8 g/dL — AB (ref 12.2–16.2)
Lymph, poc: 0.9 (ref 0.6–3.4)
MCH, POC: 27.6 pg (ref 27–31.2)
MCHC: 32.5 g/dL (ref 31.8–35.4)
MCV: 85 fL (ref 80–97)
MPV: 8.4 fL (ref 0–99.8)
POC Granulocyte: 1.9 — AB (ref 2–6.9)
POC LYMPH PERCENT: 29.3 %L (ref 10–50)
Platelet Count, POC: 149 10*3/uL (ref 142–424)
RBC: 3.92 M/uL — AB (ref 4.04–5.48)
RDW, POC: 14.1 %
WBC: 3.1 10*3/uL — AB (ref 4.6–10.2)

## 2015-02-22 LAB — POCT GLYCOSYLATED HEMOGLOBIN (HGB A1C): HEMOGLOBIN A1C: 5

## 2015-03-01 ENCOUNTER — Encounter: Payer: Self-pay | Admitting: Family Medicine

## 2015-03-01 ENCOUNTER — Ambulatory Visit (INDEPENDENT_AMBULATORY_CARE_PROVIDER_SITE_OTHER): Payer: Medicare Other | Admitting: Family Medicine

## 2015-03-01 VITALS — BP 139/57 | HR 66 | Temp 97.0°F | Ht 65.0 in | Wt 127.8 lb

## 2015-03-01 DIAGNOSIS — D649 Anemia, unspecified: Secondary | ICD-10-CM

## 2015-03-01 DIAGNOSIS — E785 Hyperlipidemia, unspecified: Secondary | ICD-10-CM | POA: Diagnosis not present

## 2015-03-01 DIAGNOSIS — E559 Vitamin D deficiency, unspecified: Secondary | ICD-10-CM

## 2015-03-01 DIAGNOSIS — D692 Other nonthrombocytopenic purpura: Secondary | ICD-10-CM

## 2015-03-01 DIAGNOSIS — I10 Essential (primary) hypertension: Secondary | ICD-10-CM | POA: Diagnosis not present

## 2015-03-01 DIAGNOSIS — N189 Chronic kidney disease, unspecified: Secondary | ICD-10-CM

## 2015-03-01 NOTE — Patient Instructions (Addendum)
Continue current medications. Continue good therapeutic lifestyle changes which include good diet and exercise. Fall precautions discussed with patient. If an FOBT was given today- please return it to our front desk. If you are over 79 years old - you may need Prevnar 101 or the adult Pneumonia vaccine. Please come back in after 03/13/2015.  Flu Shots will be available at our office starting mid- September. Please call and schedule a FLU CLINIC APPOINTMENT.   She should continue to exercise regularly and keep her follow-up visits with the nephrologist and the cardiologist This summer especially she should drink plenty of fluids She will return to clinic as planned for lab work after July 26.

## 2015-03-01 NOTE — Progress Notes (Signed)
Subjective:    Patient ID: [name], female    DOB: 03-22-1934, 81 y.o.   MRN: [id number]  HPI Patient is here today for a follow up on her chronic medical problems. The patient has no specific complaints. Her biggest issue currently is controlling her blood pressure adequately. She also has chronic kidney disease stage III. She sees Dr. Mercy Deloros Beretta regularly who is her nephrologist. She is taking her medications regularly. The patient is pleasant and doing well with no specific complaints. She is followed by Dr. Mercy Milda Lindvall and by the cardiologist Dr. Meda Coffee. She no longer has to see the gastroenterologist. She is taking her iron medication regularly. She is taking her blood pressure medicine regularly and leaves office hydralazine on the next dose if the blood pressures running low. This is per directions of Dr. Mercy Norely Schlick. She denies chest pain shortness of breath problems with her GI tract or problems voiding. She does indicate that she bruises more easily.   Review of Systems  Constitutional: Negative.   HENT: Negative.   Eyes: Negative.   Respiratory: Negative.   Cardiovascular: Negative.   Gastrointestinal: Negative.   Endocrine: Negative.   Genitourinary: Negative.   Musculoskeletal: Negative.   Skin: Negative.   Allergic/Immunologic: Negative.   Neurological: Negative.   Hematological: Negative.   Psychiatric/Behavioral: Negative.     Patient Active Problem List   Diagnosis Date Noted  . Chronic diastolic heart failure 83/29/1916  . Acute diastolic heart failure 60/60/0459  . Hypertensive urgency 04/02/2014  . Headache 04/02/2014  . CKD (chronic kidney disease) stage 3, GFR 30-59 ml/min 04/02/2014  . Paroxysmal atrial fibrillation 01/25/2014  . Hypokalemia 07/19/2013  . Anemia 07/19/2013  . Bilateral lower extremity edema 06/30/2013  . Hypertension with fluid overload 06/30/2013  . Need for prophylactic vaccination and inoculation against influenza 05/31/2013  .  Pedal edema 05/10/2013  . Varicose veins of lower extremities with other complications 97/74/1423  . ABNORMAL STRESS ELECTROCARDIOGRAM 01/23/2010  . HYPOTHYROIDISM 12/18/2009  . HYPERLIPIDEMIA 12/18/2009  . HYPERTENSION 12/18/2009  . RENAL INSUFFICIENCY, CHRONIC 12/18/2009  . ARTHRITIS 12/18/2009   Outpatient Encounter Prescriptions as of 03/01/2015  Medication Sig  . aspirin EC 81 MG tablet Take 81 mg by mouth every other day.  Marland Kitchen atenolol-chlorthalidone (TENORETIC) 50-25 MG per tablet Take 1 tablet by mouth daily.  Marland Kitchen atorvastatin (LIPITOR) 20 MG tablet TAKE 1/2 TABLET DAILY  . calcium-vitamin D (OSCAL WITH D) 500-200 MG-UNIT per tablet Take 1 tablet by mouth daily.    . cloNIDine (CATAPRES) 0.1 MG tablet Take 0.1 mg by mouth daily. 1 tablet as needed for systolic blood pressure over 180  . ferrous sulfate ([employer] IRON) 325 (65 FE) MG tablet Take 325 mg by mouth 2 (two) times daily with a meal.  . flecainide (TAMBOCOR) 50 MG tablet TAKE 1 TABLET (50 MG TOTAL) BY MOUTH 2 (TWO) TIMES DAILY.  . hydrALAZINE (APRESOLINE) 25 MG tablet Take 2 tablets (50 mg total) by mouth 2 (two) times daily. (Patient taking differently: Take 75 mg by mouth 3 (three) times daily. )  . levothyroxine (SYNTHROID, LEVOTHROID) 75 MCG tablet Take 1 tablet (75 mcg total) by mouth daily.  Marland Kitchen lisinopril (PRINIVIL,ZESTRIL) 20 MG tablet Take 1 tablet by mouth 2 (two) times daily.   . Omega-3 Fatty Acids (FISH OIL) 1200 MG CAPS Take 1,200 mg by mouth daily.   No facility-administered encounter medications on file as of 03/01/2015.      Objective:   Physical Exam  Constitutional: She is  oriented to person, place, and time. She appears well-developed and well-nourished. No distress.  Patient is pleasant alert and relaxed today.  HENT:  Head: Normocephalic and atraumatic.  Right Ear: External ear normal.  Left Ear: External ear normal.  Nose: Nose normal.  Mouth/Throat: Oropharynx is clear and moist.  Eyes: Conjunctivae and  EOM are normal. Pupils are equal, round, and reactive to light. Right eye exhibits no discharge. Left eye exhibits no discharge. No scleral icterus.  Neck: Normal range of motion. Neck supple. No thyromegaly present.  There were carotid bruits bilaterally.  Cardiovascular: Normal rate, regular rhythm and intact distal pulses.  Exam reveals no gallop and no friction rub.   Murmur heard. The rhythm is regular at 72/m. There is a systolic ejection murmur grade 2/6.  Pulmonary/Chest: Effort normal and breath sounds normal. No respiratory distress. She has no wheezes. She has no rales. She exhibits no tenderness.  Abdominal: Soft. Bowel sounds are normal. She exhibits no mass. There is no tenderness. There is no rebound and no guarding.  Musculoskeletal: Normal range of motion. She exhibits no edema or tenderness.  Lymphadenopathy:    She has no cervical adenopathy.  Neurological: She is alert and oriented to person, place, and time. She has normal reflexes. No cranial nerve deficit.  Skin: Skin is warm and dry.  Is bruising on the anterior legs.  Psychiatric: She has a normal mood and affect. Her behavior is normal. Judgment and thought content normal.  Nursing note and vitals reviewed.   BP 139/57 mmHg  Pulse 66  Temp(Src) 97 F (36.1 C) (Oral)  Ht '5\' 5"'  (1.651 m)  Wt 127 lb 12.8 oz (57.97 kg)  BMI 21.27 kg/m2         Assessment & Plan:  1. Vitamin D deficiency -This will be checked with upcoming labs. - Vit D  25 hydroxy (rtn osteoporosis monitoring); Future  2. Essential hypertension -The blood pressure is actually good today and the readings that she brings in from the outside are also good and I will be scanned into the record. - BMP8+EGFR; Future - Hepatitis C antibody; Future  3. HLD (hyperlipidemia) -She should continue with her omega-3 fatty acids and her atorvastatin and we will check her lab work by the end of the month. - NMR, lipoprofile; Future  4. Senile  purpura -We will be checking a CBC today and her platelet count.  5. Anemia, unspecified anemia type -She should continue with her iron supplementation pending results of lab work  6. CKD (chronic kidney disease), unspecified stage -She should continue to follow-up with her nephrologist  Meds ordered this encounter  Medications  . ferrous sulfate ([employer] IRON) 325 (65 FE) MG tablet    Sig: Take 325 mg by mouth 2 (two) times daily with a meal.   Patient Instructions  Continue current medications. Continue good therapeutic lifestyle changes which include good diet and exercise. Fall precautions discussed with patient. If an FOBT was given today- please return it to our front desk. If you are over 28 years old - you may need Prevnar 94 or the adult Pneumonia vaccine. Please come back in after 03/13/2015.  Flu Shots will be available at our office starting mid- September. Please call and schedule a FLU CLINIC APPOINTMENT.   She should continue to exercise regularly and keep her follow-up visits with the nephrologist and the cardiologist This summer especially she should drink plenty of fluids She will return to clinic as planned for lab work  after July 26.   Arrie Senate MD

## 2015-03-14 ENCOUNTER — Other Ambulatory Visit (INDEPENDENT_AMBULATORY_CARE_PROVIDER_SITE_OTHER): Payer: Medicare Other

## 2015-03-14 DIAGNOSIS — I10 Essential (primary) hypertension: Secondary | ICD-10-CM

## 2015-03-14 DIAGNOSIS — E559 Vitamin D deficiency, unspecified: Secondary | ICD-10-CM | POA: Diagnosis not present

## 2015-03-14 DIAGNOSIS — E785 Hyperlipidemia, unspecified: Secondary | ICD-10-CM | POA: Diagnosis not present

## 2015-03-14 NOTE — Progress Notes (Signed)
Lab only 

## 2015-03-15 LAB — BMP8+EGFR
BUN / CREAT RATIO: 13 (ref 11–26)
BUN: 19 mg/dL (ref 8–27)
CALCIUM: 9.3 mg/dL (ref 8.7–10.3)
CO2: 29 mmol/L (ref 18–29)
Chloride: 96 mmol/L — ABNORMAL LOW (ref 97–108)
Creatinine, Ser: 1.42 mg/dL — ABNORMAL HIGH (ref 0.57–1.00)
GFR calc Af Amer: 41 mL/min/{1.73_m2} — ABNORMAL LOW (ref >59)
GFR calc non Af Amer: 35 mL/min/{1.73_m2} — ABNORMAL LOW (ref >59)
GLUCOSE: 98 mg/dL (ref 65–99)
Potassium: 4.2 mmol/L (ref 3.5–5.2)
Sodium: 138 mmol/L (ref 134–144)

## 2015-03-15 LAB — HEPATITIS C ANTIBODY: Hep C Virus Ab: <0.1 s/co ratio (ref 0.0–0.9)

## 2015-03-15 LAB — NMR, LIPOPROFILE
CHOLESTEROL: 123 mg/dL (ref 100–199)
HDL Cholesterol by NMR: 47 mg/dL (ref >39)
HDL Particle Number: 26.3 umol/L — ABNORMAL LOW (ref >=30.5)
LDL Particle Number: 794 nmol/L (ref <1000)
LDL Size: 20.7 nm (ref >20.5)
LDL-C: 55 mg/dL (ref 0–99)
LP-IR Score: 35 (ref <=45)
Small LDL Particle Number: 460 nmol/L (ref <=527)
TRIGLYCERIDES BY NMR: 104 mg/dL (ref 0–149)

## 2015-03-15 LAB — VITAMIN D 25 HYDROXY (VIT D DEFICIENCY, FRACTURES): Vit D, 25-Hydroxy: 65.5 ng/mL (ref 30.0–100.0)

## 2015-03-23 ENCOUNTER — Telehealth: Payer: Self-pay | Admitting: Family Medicine

## 2015-03-23 NOTE — Telephone Encounter (Signed)
Patient aware of results.

## 2015-04-02 DIAGNOSIS — I129 Hypertensive chronic kidney disease with stage 1 through stage 4 chronic kidney disease, or unspecified chronic kidney disease: Secondary | ICD-10-CM | POA: Diagnosis not present

## 2015-04-02 DIAGNOSIS — N2889 Other specified disorders of kidney and ureter: Secondary | ICD-10-CM | POA: Diagnosis not present

## 2015-04-02 DIAGNOSIS — E785 Hyperlipidemia, unspecified: Secondary | ICD-10-CM | POA: Diagnosis not present

## 2015-04-02 DIAGNOSIS — N183 Chronic kidney disease, stage 3 (moderate): Secondary | ICD-10-CM | POA: Diagnosis not present

## 2015-04-24 DIAGNOSIS — M79676 Pain in unspecified toe(s): Secondary | ICD-10-CM | POA: Diagnosis not present

## 2015-04-24 DIAGNOSIS — B351 Tinea unguium: Secondary | ICD-10-CM | POA: Diagnosis not present

## 2015-05-07 ENCOUNTER — Other Ambulatory Visit: Payer: Self-pay

## 2015-05-09 ENCOUNTER — Ambulatory Visit (INDEPENDENT_AMBULATORY_CARE_PROVIDER_SITE_OTHER): Payer: Medicare Other | Admitting: Cardiology

## 2015-05-09 ENCOUNTER — Encounter: Payer: Self-pay | Admitting: Cardiology

## 2015-05-09 VITALS — BP 180/72 | HR 51 | Wt 123.6 lb

## 2015-05-09 DIAGNOSIS — I10 Essential (primary) hypertension: Secondary | ICD-10-CM

## 2015-05-09 DIAGNOSIS — I701 Atherosclerosis of renal artery: Secondary | ICD-10-CM

## 2015-05-09 DIAGNOSIS — I5032 Chronic diastolic (congestive) heart failure: Secondary | ICD-10-CM | POA: Diagnosis not present

## 2015-05-09 DIAGNOSIS — R0989 Other specified symptoms and signs involving the circulatory and respiratory systems: Secondary | ICD-10-CM | POA: Diagnosis not present

## 2015-05-09 HISTORY — DX: Atherosclerosis of renal artery: I70.1

## 2015-05-09 NOTE — Patient Instructions (Signed)
Your physician recommends that you continue on your current medications as directed. Please refer to the Current Medication list given to you today.   Your physician wants you to follow-up in: ONE YEAR WITH DR NELSON You will receive a reminder letter in the mail two months in advance. If you don't receive a letter, please call our office to schedule the follow-up appointment.  

## 2015-05-09 NOTE — Progress Notes (Signed)
Patient ID: [name], female   DOB: 01/13/1934, 81 y.o.   MRN: [id number]    Patient Care Team: Chipper Herb, MD as PCP - General (Family Medicine)  HPI  [name] is a 81 y.o. female  81 year old female with HTN -difficult to control with chronic diastolic CHF, and arrhythmias - seen by Dr Caryl Comes - left atrial tachycardias, that resolved on Flecainide.  She has had normal stress test and worsening LVH - now moderate with preserved LVEF and elevated filling pressures. She was recently admitted to the hospital for hypertensive urgency.  Today she states that she is feeling well, denies chest pain or DOE. She sttaes that her BP is being controlled by her nephrologist who wants to continue medical therapy (> 60% stenosis in the left renal artery). She also states that her BP is controlled at home. She denies orthopnea, PND, no palpitations or syncope.  She has been loosing weight, 11 lbs in the last 6 months, negative colonoscopy and EGD, normal thyroid testing.  No LE edema.  Past Medical History  Diagnosis Date  . Nodule of right lung     Right upper lobe  . Hyperlipidemia   . Hypertension   . Hypothyroidism   . Renal insufficiency     Chronic  . Arthritis   . CKD (chronic kidney disease), stage III   . Anemia   . Renal vascular disease   . Asthma     Past Surgical History  Procedure Laterality Date  . Thyroidectomy, partial      Current Outpatient Prescriptions  Medication Sig Dispense Refill  . aspirin EC 81 MG tablet Take 81 mg by mouth every other day.    Marland Kitchen atenolol-chlorthalidone (TENORETIC) 50-25 MG per tablet Take 1 tablet by mouth daily. 90 tablet 3  . atorvastatin (LIPITOR) 20 MG tablet TAKE 1/2 TABLET DAILY 30 tablet 1  . calcium-vitamin D (OSCAL WITH D) 500-200 MG-UNIT per tablet Take 1 tablet by mouth daily.      . cloNIDine (CATAPRES) 0.1 MG tablet Take 0.1 mg by mouth daily. 1 tablet as needed for systolic blood pressure over 180  6  . ferrous  sulfate ([employer] IRON) 325 (65 FE) MG tablet Take 325 mg by mouth 2 (two) times daily with a meal.    . flecainide (TAMBOCOR) 50 MG tablet TAKE 1 TABLET (50 MG TOTAL) BY MOUTH 2 (TWO) TIMES DAILY. 180 tablet 3  . hydrALAZINE (APRESOLINE) 25 MG tablet Take 25 mg by mouth 3 (three) times daily.    . hydrALAZINE (APRESOLINE) 50 MG tablet Take 50 mg by mouth 3 (three) times daily.    Marland Kitchen levothyroxine (SYNTHROID, LEVOTHROID) 75 MCG tablet Take 1 tablet (75 mcg total) by mouth daily. 90 tablet 2  . lisinopril (PRINIVIL,ZESTRIL) 20 MG tablet Take 1 tablet by mouth 2 (two) times daily.   6  . Omega-3 Fatty Acids (FISH OIL) 1200 MG CAPS Take 1,200 mg by mouth daily.     No current facility-administered medications for this visit.    Allergies  Allergen Reactions  . Clonidine Derivatives Nausea Only and Hypertension  . Isosorbide Nausea Only  . Sulfonamide Derivatives Nausea Only and Rash  . Terazosin Hives and Nausea Only    Review of Systems negative except from HPI and PMH  Physical Exam BP 180/72 mmHg  Pulse 51  Wt 123 lb 9.6 oz (56.065 kg)  SpO2 98% Well developed and well nourished in no acute distress HENT normal  E scleral and icterus clear Neck Supple JVP flat; carotids brisk and full Clear to ausculation bRegular rate and rhythm, no murmurs gallops or rub Soft with active bowel sounds No clubbing cyanosis  Edema Alert and oriented, grossly normal motor and sensory function Skin Warm and Dry  ECG 6/22 demonstrated sinus rhythm at 69 intervals 18/09/40 Axis is 85  ECG from 5/28 demonstrated the mechanism of her tachycardia. She has a left atrial tachycardia identified by P-wave inversion in lead V1.   Tracing 16:10:47 demonstrates one-to-one conduction tracing 16:11:33 and a straight intermittent atrial tachycardia  ECHO 04/03/2014  - Left ventricle: The cavity size was normal. There was moderate concentric hypertrophy. Systolic function was vigorous. The estimated ejection  fraction was in the range of 65% to 70%. Wall motion was normal; there were no regional wall motion abnormalities. Doppler parameters are consistent with abnormal left ventricular relaxation (grade 1 diastolic dysfunction). The E/e&' ratio is between 8-15, suggesting indeterminate LV Filling pressure. - Aortic valve: Structurally normal valve. Trileaflet. Transvalvular velocity was minimally increased, most of which is due to intracavitary gradient. There was no stenosis. There was no regurgitation. - Left atrium: The atrium was at the upper limits of normal in size. - Tricuspid valve: TR jet not adequate to measure RVSP.   Nuclear stress test 02/23/2014  - Left ventricle: The cavity size was normal. There was moderate concentric hypertrophy. Systolic function was vigorous. The estimated ejection fraction was in the range of 65% to 70%. Wall motion was normal; there were no regional wall motion abnormalities. Doppler parameters are consistent with abnormal left ventricular relaxation (grade 1 diastolic dysfunction). The E/e&' ratio is between 8-15, suggesting indeterminate LV Filling pressure. - Aortic valve: Structurally normal valve. Trileaflet. Transvalvular velocity was minimally increased, most of which is due to intracavitary gradient. There was no stenosis. There was no regurgitation. - Left atrium: The atrium was at the upper limits of normal in size. - Tricuspid valve: TR jet not adequate to measure RVSP.  Renal arterial US: 09/29/2014 Normal caliber abdominal aorta. Normal and symmetrical kidney size. Small simple cysts in the right kidney, described above. No evidence of hydronephrosis is noted in the kidneys. The IVC and renal veins are patent. >60% ostial right renal artery stenosis. Normal left renal artery. Suggest PV consult if clinically indicated  ECG: SB, 1.AVB, iRBBB    Assessment and  Plan  Atrial tachycardia Sinus  bradycardia Hypertension HFpEF  1. HFpEF - moderate and worsening LVH on the most recent echo,  Euvolemic, stable Crea 1.3- 1.4. The patient keeps loosing weight. The patient actually lost weight.   2. Left atrial tachycardia - no evidence of atrial fibrillation. Resolved on Flecainide. We will continue, in SR today, no palpitations since January 2015.  3. HTN - we added Imdur at the last visit, that gave her headaches, we increased lisinopril to 20 mg po daily, in January 2016 BP up to 200, she was found to have > 60 % stenosis in the right renal artery. Normal left renal artery, PV consult was recommended. Her nephrologist wants to manage medically at first. We will follow.  4. Left sided carotid bruit - the patient underwent carotid Duplex and results were sent to Dr Laurance Flatten, she is advised to call him ASAP for the results.   Follow up in 1 year.   Dorothy Spark 05/09/2015

## 2015-05-25 ENCOUNTER — Other Ambulatory Visit: Payer: Self-pay | Admitting: Family Medicine

## 2015-05-30 ENCOUNTER — Ambulatory Visit (INDEPENDENT_AMBULATORY_CARE_PROVIDER_SITE_OTHER): Payer: Medicare Other

## 2015-05-30 DIAGNOSIS — Z23 Encounter for immunization: Secondary | ICD-10-CM

## 2015-06-15 ENCOUNTER — Other Ambulatory Visit (INDEPENDENT_AMBULATORY_CARE_PROVIDER_SITE_OTHER): Payer: Medicare Other

## 2015-06-15 DIAGNOSIS — R5383 Other fatigue: Secondary | ICD-10-CM

## 2015-06-15 DIAGNOSIS — I10 Essential (primary) hypertension: Secondary | ICD-10-CM | POA: Diagnosis not present

## 2015-06-15 DIAGNOSIS — E785 Hyperlipidemia, unspecified: Secondary | ICD-10-CM | POA: Diagnosis not present

## 2015-06-15 DIAGNOSIS — E559 Vitamin D deficiency, unspecified: Secondary | ICD-10-CM

## 2015-06-15 NOTE — Progress Notes (Signed)
Lab only 

## 2015-06-16 ENCOUNTER — Encounter: Payer: Self-pay | Admitting: Family Medicine

## 2015-06-16 DIAGNOSIS — D696 Thrombocytopenia, unspecified: Secondary | ICD-10-CM | POA: Insufficient documentation

## 2015-06-16 LAB — CBC WITH DIFFERENTIAL/PLATELET
BASOS ABS: 0 10*3/uL (ref 0.0–0.2)
Basos: 1 %
EOS (ABSOLUTE): 0 10*3/uL (ref 0.0–0.4)
Eos: 1 %
Hematocrit: 31.2 % — ABNORMAL LOW (ref 34.0–46.6)
Hemoglobin: 10.2 g/dL — ABNORMAL LOW (ref 11.1–15.9)
IMMATURE GRANS (ABS): 0 10*3/uL (ref 0.0–0.1)
IMMATURE GRANULOCYTES: 0 %
LYMPHS: 21 %
Lymphocytes Absolute: 0.7 10*3/uL (ref 0.7–3.1)
MCH: 28.2 pg (ref 26.6–33.0)
MCHC: 32.7 g/dL (ref 31.5–35.7)
MCV: 86 fL (ref 79–97)
Monocytes Absolute: 0.4 10*3/uL (ref 0.1–0.9)
Monocytes: 11 %
NEUTROS PCT: 66 %
Neutrophils Absolute: 2.2 10*3/uL (ref 1.4–7.0)
PLATELETS: 132 10*3/uL — AB (ref 150–379)
RBC: 3.62 x10E6/uL — AB (ref 3.77–5.28)
RDW: 13.9 % (ref 12.3–15.4)
WBC: 3.3 10*3/uL — AB (ref 3.4–10.8)

## 2015-06-16 LAB — HEPATIC FUNCTION PANEL
ALT: 24 IU/L (ref 0–32)
AST: 27 IU/L (ref 0–40)
Albumin: 4.1 g/dL (ref 3.5–4.8)
Alkaline Phosphatase: 72 IU/L (ref 39–117)
BILIRUBIN TOTAL: 0.5 mg/dL (ref 0.0–1.2)
BILIRUBIN, DIRECT: 0.16 mg/dL (ref 0.00–0.40)
TOTAL PROTEIN: 6.8 g/dL (ref 6.0–8.5)

## 2015-06-16 LAB — BMP8+EGFR
BUN / CREAT RATIO: 15 (ref 11–26)
BUN: 18 mg/dL (ref 8–27)
CO2: 26 mmol/L (ref 18–29)
CREATININE: 1.24 mg/dL — AB (ref 0.57–1.00)
Calcium: 9 mg/dL (ref 8.7–10.3)
Chloride: 95 mmol/L — ABNORMAL LOW (ref 97–106)
GFR calc Af Amer: 48 mL/min/{1.73_m2} — ABNORMAL LOW (ref >59)
GFR calc non Af Amer: 41 mL/min/{1.73_m2} — ABNORMAL LOW (ref >59)
GLUCOSE: 107 mg/dL — AB (ref 65–99)
Potassium: 3.9 mmol/L (ref 3.5–5.2)
Sodium: 138 mmol/L (ref 136–144)

## 2015-06-16 LAB — NMR, LIPOPROFILE
CHOLESTEROL: 123 mg/dL (ref 100–199)
HDL Cholesterol by NMR: 43 mg/dL (ref >39)
HDL Particle Number: 24.7 umol/L — ABNORMAL LOW (ref >=30.5)
LDL PARTICLE NUMBER: 737 nmol/L (ref <1000)
LDL Size: 20.6 nm (ref >20.5)
LDL-C: 57 mg/dL (ref 0–99)
LP-IR SCORE: 25 (ref <=45)
Small LDL Particle Number: 244 nmol/L (ref <=527)
TRIGLYCERIDES BY NMR: 114 mg/dL (ref 0–149)

## 2015-06-16 LAB — THYROID PANEL WITH TSH
FREE THYROXINE INDEX: 3.5 (ref 1.2–4.9)
T3 UPTAKE RATIO: 32 % (ref 24–39)
T4, Total: 10.9 ug/dL (ref 4.5–12.0)
TSH: 2.18 u[IU]/mL (ref 0.450–4.500)

## 2015-06-16 LAB — VITAMIN D 25 HYDROXY (VIT D DEFICIENCY, FRACTURES): Vit D, 25-Hydroxy: 67.1 ng/mL (ref 30.0–100.0)

## 2015-06-19 ENCOUNTER — Telehealth: Payer: Self-pay | Admitting: Family Medicine

## 2015-06-19 NOTE — Telephone Encounter (Signed)
Spoke with patient and results given. 

## 2015-06-22 ENCOUNTER — Encounter: Payer: Self-pay | Admitting: Family Medicine

## 2015-06-22 ENCOUNTER — Ambulatory Visit (INDEPENDENT_AMBULATORY_CARE_PROVIDER_SITE_OTHER): Payer: Medicare Other | Admitting: Family Medicine

## 2015-06-22 ENCOUNTER — Encounter: Payer: Self-pay | Admitting: *Deleted

## 2015-06-22 VITALS — BP 172/63 | HR 59 | Temp 97.4°F | Ht 65.0 in | Wt 126.0 lb

## 2015-06-22 DIAGNOSIS — I10 Essential (primary) hypertension: Secondary | ICD-10-CM | POA: Diagnosis not present

## 2015-06-22 DIAGNOSIS — I480 Paroxysmal atrial fibrillation: Secondary | ICD-10-CM | POA: Diagnosis not present

## 2015-06-22 DIAGNOSIS — N189 Chronic kidney disease, unspecified: Secondary | ICD-10-CM

## 2015-06-22 DIAGNOSIS — I5031 Acute diastolic (congestive) heart failure: Secondary | ICD-10-CM | POA: Diagnosis not present

## 2015-06-22 DIAGNOSIS — I701 Atherosclerosis of renal artery: Secondary | ICD-10-CM | POA: Diagnosis not present

## 2015-06-22 DIAGNOSIS — N183 Chronic kidney disease, stage 3 unspecified: Secondary | ICD-10-CM

## 2015-06-22 DIAGNOSIS — D696 Thrombocytopenia, unspecified: Secondary | ICD-10-CM

## 2015-06-22 DIAGNOSIS — E785 Hyperlipidemia, unspecified: Secondary | ICD-10-CM | POA: Diagnosis not present

## 2015-06-22 DIAGNOSIS — D692 Other nonthrombocytopenic purpura: Secondary | ICD-10-CM

## 2015-06-22 DIAGNOSIS — D649 Anemia, unspecified: Secondary | ICD-10-CM | POA: Diagnosis not present

## 2015-06-22 DIAGNOSIS — E559 Vitamin D deficiency, unspecified: Secondary | ICD-10-CM

## 2015-06-22 NOTE — Patient Instructions (Addendum)
Medicare Annual Wellness Visit  [place] and the medical providers at [facility] strive to bring you the best medical care.  In doing so we not only want to address your current medical conditions and concerns but also to detect new conditions early and prevent illness, disease and health-related problems.    Medicare offers a yearly Wellness Visit which allows our clinical staff to assess your need for preventative services including immunizations, lifestyle education, counseling to decrease risk of preventable diseases and screening for fall risk and other medical concerns.    This visit is provided free of charge (no copay) for all Medicare recipients. The clinical pharmacists at [facility] have begun to conduct these Wellness Visits which will also include a thorough review of all your medications.    As you primary medical provider recommend that you make an appointment for your Annual Wellness Visit if you have not done so already this year.  You may set up this appointment before you leave today or you may call back [phone]) and schedule an appointment.  Please make sure when you call that you mention that you are scheduling your Annual Wellness Visit with the clinical pharmacist so that the appointment may be made for the proper length of time.     Continue current medications. Continue good therapeutic lifestyle changes which include good diet and exercise. Fall precautions discussed with patient. If an FOBT was given today- please return it to our front desk. If you are over 88 years old - you may need Prevnar 24 or the adult Pneumonia vaccine.  **Flu shots are available--- please call and schedule a FLU-CLINIC appointment**  After your visit with [place] today you will receive a survey in the mail or online from [employer] regarding your care with [place]. Please take a moment to fill this out. Your feedback is very  important to [place] as you can help [place] better understand your patient needs as well as improve your experience and satisfaction. WE CARE ABOUT YOU!!!   Keep monitoring BP's  Follow up in 4 mos  The patient should continue to follow-up with the cardiologist and the nephrologist. Continue physical activity and drink plenty of fluids We will have the pharmacist call you about a better choice for iron replacement as soon as she becomes available in the meantime continue current replacement

## 2015-06-22 NOTE — Progress Notes (Signed)
Subjective:    Patient ID: [name], female    DOB: [birth year], 81 y.o.   MRN: [id number]  HPI Pt here for follow up and management of chronic medical problems which includes hypertension and hyperlipidemia. She is taking medications regularly. This patient brings in outside blood pressures for review and the majority of these are good. She is followed regularly by the nephrologist. She has had a difficult time getting her blood pressure under good control. She also is complaining of fatigue and weakness and has had a significant weight loss over several years. A CT scan done of her abdomen and April was reviewed and there were no significant changes from previous CT scans. The patient is pleasant and alert and would just like to feel that she has more energy. We've reviewed her past referrals and evaluations and still conclude there is nothing else that we can do at this point in time and what we are currently doing. Her hemoglobin remains slightly decreased and we make an change the iron preparation to get her hemoglobin up a little bit more. She denies chest pain shortness of breath trouble swallowing heartburn indigestion and nausea vomiting diarrhea or blood in the stool. She is not having any abdominal pain and no trouble passing her water. She sees the cardiologist yearly and the nephrologist every 6 months. She says that when she comes to the doctor's office her blood pressure is always elevated. The readings that she brings in from home are good. They will be scanned into the record.    Patient Active Problem List   Diagnosis Date Noted  . Thrombocytopenia ([place]) 06/16/2015  . Renal artery stenosis ([place]) 05/09/2015  . Chronic diastolic heart failure ([place]) 04/03/2014  . Acute diastolic heart failure ([place]) 04/03/2014  . Hypertensive urgency 04/02/2014  . Headache 04/02/2014  . CKD (chronic kidney disease) stage 3, GFR 30-59 ml/min 04/02/2014  . Paroxysmal atrial fibrillation ([place])  01/25/2014  . Hypokalemia 07/19/2013  . Anemia 07/19/2013  . Bilateral lower extremity edema 06/30/2013  . Hypertension with fluid overload 06/30/2013  . Need for prophylactic vaccination and inoculation against influenza 05/31/2013  . Pedal edema 05/10/2013  . Varicose veins of lower extremities with other complications [id number]  . ABNORMAL STRESS ELECTROCARDIOGRAM 01/23/2010  . HYPOTHYROIDISM 12/18/2009  . HYPERLIPIDEMIA 12/18/2009  . HYPERTENSION 12/18/2009  . RENAL INSUFFICIENCY, CHRONIC 12/18/2009  . ARTHRITIS 12/18/2009   Outpatient Encounter Prescriptions as of 06/22/2015  Medication Sig  . aspirin EC 81 MG tablet Take 81 mg by mouth every other day.  Marland Kitchen atenolol-chlorthalidone (TENORETIC) 50-25 MG per tablet Take 1 tablet by mouth daily.  Marland Kitchen atorvastatin (LIPITOR) 20 MG tablet TAKE 1/2 TABLET DAILY  . calcium-vitamin D (OSCAL WITH D) 500-200 MG-UNIT per tablet Take 1 tablet by mouth daily.    . cloNIDine (CATAPRES) 0.1 MG tablet Take 0.1 mg by mouth daily. 1 tablet as needed for systolic blood pressure over 180  . ferrous sulfate ([employer] IRON) 325 (65 FE) MG tablet Take 325 mg by mouth 2 (two) times daily with a meal.  . flecainide (TAMBOCOR) 50 MG tablet TAKE 1 TABLET (50 MG TOTAL) BY MOUTH 2 (TWO) TIMES DAILY.  . hydrALAZINE (APRESOLINE) 25 MG tablet Take 25 mg by mouth 3 (three) times daily.  . hydrALAZINE (APRESOLINE) 50 MG tablet Take 50 mg by mouth 3 (three) times daily.  Marland Kitchen levothyroxine (SYNTHROID, LEVOTHROID) 75 MCG tablet TAKE 1 TABLET (75 MCG TOTAL) BY MOUTH DAILY.  Marland Kitchen lisinopril (PRINIVIL,ZESTRIL) 20  MG tablet Take 1 tablet by mouth 2 (two) times daily.   . Omega-3 Fatty Acids (FISH OIL) 1200 MG CAPS Take 1,200 mg by mouth daily.   No facility-administered encounter medications on file as of 06/22/2015.      Review of Systems  Constitutional: Positive for fatigue and unexpected weight change (over last 2 yrs down about 20 lbs).  HENT: Negative.   Eyes: Negative.     Respiratory: Negative.   Cardiovascular: Negative.   Gastrointestinal: Negative.   Endocrine: Negative.   Genitourinary: Negative.   Musculoskeletal: Negative.   Skin: Negative.   Allergic/Immunologic: Negative.   Neurological: Negative.   Hematological: Negative.   Psychiatric/Behavioral: Negative.        Objective:   Physical Exam  Constitutional: She is oriented to person, place, and time. She appears well-developed and well-nourished. No distress.  Thin pleasant and alert  HENT:  Head: Normocephalic and atraumatic.  Right Ear: External ear normal.  Left Ear: External ear normal.  Nose: Nose normal.  Mouth/Throat: Oropharynx is clear and moist.  Eyes: Conjunctivae and EOM are normal. Pupils are equal, round, and reactive to light. Right eye exhibits no discharge. Left eye exhibits no discharge. No scleral icterus.  Neck: Normal range of motion. Neck supple. No thyromegaly present.  Cardiovascular: Normal rate, regular rhythm and intact distal pulses.  Exam reveals no gallop and no friction rub.   Murmur heard. There is a grade 2/6 systolic ejection murmur and the rate is 68-72/m  Pulmonary/Chest: Effort normal and breath sounds normal. No respiratory distress. She has no wheezes. She has no rales. She exhibits no tenderness.  Abdominal: Soft. Bowel sounds are normal. She exhibits no mass. There is no tenderness. There is no rebound and no guarding.  No abdominal tenderness or bruits  Musculoskeletal: Normal range of motion. She exhibits no edema or tenderness.  Lymphadenopathy:    She has no cervical adenopathy.  Neurological: She is alert and oriented to person, place, and time. She has normal reflexes. No cranial nerve deficit.  Skin: Skin is warm and dry. No rash noted.  Psychiatric: She has a normal mood and affect. Her behavior is normal. Judgment and thought content normal.  Nursing note and vitals reviewed.  BP 172/63 mmHg  Pulse 59  Temp(Src) 97.4 F (36.3 C)  (Oral)  Ht 5\' 5"  (1.651 m)  Wt 126 lb (57.153 kg)  BMI 20.97 kg/m2        Assessment & Plan:  1. Essential hypertension -She continues to have high blood pressure readings in the office but outside readings are good. The nephrologist has seen her and has not changed her medication and we will not do that today.  2. HLD (hyperlipidemia) -Her cholesterol numbers were good and she should continue with current treatment  3. Senile purpura (H[facility]) -There was no bruising or increased bleeding noted by history today.  4. Vitamin D deficiency -Continue current treatment  5. Anemia, unspecified anemia type -The patient's hemoglobin is running in the low tens. She is taking, nature's own iron preparation and we will change that.  6. CKD (chronic kidney disease), unspecified stage -Continue to follow-up with nephrology as planned  7. Thrombocytopenia ([place]) -We will continue to monitor the platelet count and have the patient make [place] aware of any increased bruising or bleeding.  8. RENAL INSUFFICIENCY, CHRONIC -The creatinine was actually improved some this time.  9. Renal artery stenosis (H[facility]) -Continue to follow-up with nephrology  10. Paroxysmal atrial fibrillation ([facility]) -Continue  to follow-up with cardiology   11. Acute diastolic heart failure ([place]) -Continue to follow-up with cardiology  Patient Instructions                       Medicare Annual Wellness Visit  [facility] and the medical providers at [facility] strive to bring you the best medical care.  In doing so we not only want to address your current medical conditions and concerns but also to detect new conditions early and prevent illness, disease and health-related problems.    Medicare offers a yearly Wellness Visit which allows our clinical staff to assess your need for preventative services including immunizations, lifestyle education, counseling to decrease risk of preventable diseases and  screening for fall risk and other medical concerns.    This visit is provided free of charge (no copay) for all Medicare recipients. The clinical pharmacists at [facility] have begun to conduct these Wellness Visits which will also include a thorough review of all your medications.    As you primary medical provider recommend that you make an appointment for your Annual Wellness Visit if you have not done so already this year.  You may set up this appointment before you leave today or you may call back [phone] and schedule an appointment.  Please make sure when you call that you mention that you are scheduling your Annual Wellness Visit with the clinical pharmacist so that the appointment may be made for the proper length of time.     Continue current medications. Continue good therapeutic lifestyle changes which include good diet and exercise. Fall precautions discussed with patient. If an FOBT was given today- please return it to our front desk. If you are over 47 years old - you may need Prevnar 47 or the adult Pneumonia vaccine.  **Flu shots are available--- please call and schedule a FLU-CLINIC appointment**  After your visit with [place] today you will receive a survey in the mail or online from [employer] regarding your care with [place]. Please take a moment to fill this out. Your feedback is very important to [place] as you can help [place] better understand your patient needs as well as improve your experience and satisfaction. WE CARE ABOUT YOU!!!   Keep monitoring BP's  Follow up in 4 mos  The patient should continue to follow-up with the cardiologist and the nephrologist. Continue physical activity and drink plenty of fluids We will have the pharmacist call you about a better choice for iron replacement as soon as she becomes available in the meantime continue current replacement   Arrie Senate MD

## 2015-06-22 NOTE — Progress Notes (Signed)
[name],   Pt is currently taking OTC iron supplement ferrous sulfate 65 - 1 TWICE a DAY  She and DWM think that this med may not be absorbing very well. Do you have another suggestion of something she can try. Thank you - Chrystle Murillo/DWM

## 2015-06-23 NOTE — Progress Notes (Signed)
I would recommend ferrous fumerate 324mg  (which has 106mg  of elemental iron) and take 1 tablet twice a day.  It is available in OTC as Ferrocite, Hemocyte or Ferretts.  Recommend recheck CBC in 3-4 weeks to see if improved.  Once normal might be able to decrease to 1 tablet daily.  Also ask if patient can take on empty stomach which will increase absorption though many cannot tolerate and will be ok to take with food if she needs to.  Make sure she is not taking at same time as levothyroxine or calcium supplement or that she is taking any OTC PPI's such as prilosec, nexium or prevacid.

## 2015-06-26 NOTE — Progress Notes (Signed)
Pt called and aware of all

## 2015-06-29 ENCOUNTER — Other Ambulatory Visit: Payer: Self-pay | Admitting: Physician Assistant

## 2015-07-24 ENCOUNTER — Other Ambulatory Visit: Payer: Self-pay

## 2015-07-24 DIAGNOSIS — I10 Essential (primary) hypertension: Secondary | ICD-10-CM

## 2015-07-24 MED ORDER — ATENOLOL-CHLORTHALIDONE 50-25 MG PO TABS
1.0000 | ORAL_TABLET | Freq: Every day | ORAL | Status: DC
Start: 2015-07-24 — End: 2015-11-07

## 2015-07-26 ENCOUNTER — Telehealth: Payer: Self-pay | Admitting: Family Medicine

## 2015-08-29 ENCOUNTER — Ambulatory Visit (INDEPENDENT_AMBULATORY_CARE_PROVIDER_SITE_OTHER): Payer: Medicare Other | Admitting: Family Medicine

## 2015-08-29 ENCOUNTER — Encounter: Payer: Self-pay | Admitting: Family Medicine

## 2015-08-29 VITALS — BP 146/60 | HR 60 | Temp 97.2°F | Ht 65.0 in | Wt 122.8 lb

## 2015-08-29 DIAGNOSIS — I10 Essential (primary) hypertension: Secondary | ICD-10-CM | POA: Diagnosis not present

## 2015-08-29 DIAGNOSIS — J0101 Acute recurrent maxillary sinusitis: Secondary | ICD-10-CM

## 2015-08-29 MED ORDER — PSEUDOEPHEDRINE-GUAIFENESIN ER 60-600 MG PO TB12: 1.0000 | ORAL_TABLET | Freq: Two times a day (BID) | ORAL | Status: AC

## 2015-08-29 MED ORDER — AMOXICILLIN-POT CLAVULANATE 875-125 MG PO TABS: 1.0000 | ORAL_TABLET | Freq: Two times a day (BID) | ORAL | Status: DC

## 2015-08-29 NOTE — Progress Notes (Signed)
Subjective:  Patient ID: [name], female    DOB: 08-18-1934  Age: 82 y.o. MRN: [id number]  CC: Sinusitis   HPI JINNA VAYNSHTEYN presents for blood tinged mucous for 5 days. Cold sx 2 weeks. Started as a ST & cough. No fever or dyspnea. Some sputum with cough. Better with mucinex. Still having PND. Gets sinusitis occasionally. Left ear pain with pressure for 2 weeks. Denies hearing loss.  History Janeika has a past medical history of Nodule of right lung; Hyperlipidemia; Hypertension; Hypothyroidism; Renal insufficiency; Arthritis; CKD (chronic kidney disease), stage III; Anemia; Renal vascular disease; and Asthma.   She has past surgical history that includes Thyroidectomy, partial.   Her family history includes Hypertension in her brother; Stroke in her father; Thyroid disease in her mother.She reports that she has never smoked. She does not have any smokeless tobacco history on file. She reports that she does not drink alcohol or use illicit drugs.  Outpatient Prescriptions Prior to Visit  Medication Sig Dispense Refill  . aspirin EC 81 MG tablet Take 81 mg by mouth every other day.    Marland Kitchen atenolol-chlorthalidone (TENORETIC) 50-25 MG tablet Take 1 tablet by mouth daily. 90 tablet 3  . atorvastatin (LIPITOR) 20 MG tablet TAKE 1/2 TABLET DAILY 30 tablet 5  . calcium-vitamin D (OSCAL WITH D) 500-200 MG-UNIT per tablet Take 1 tablet by mouth daily.      . cloNIDine (CATAPRES) 0.1 MG tablet Take 0.1 mg by mouth daily. 1 tablet as needed for systolic blood pressure over 180  6  . ferrous sulfate ([employer] IRON) 325 (65 FE) MG tablet Take 325 mg by mouth 2 (two) times daily with a meal.    . flecainide (TAMBOCOR) 50 MG tablet TAKE 1 TABLET (50 MG TOTAL) BY MOUTH 2 (TWO) TIMES DAILY. 180 tablet 3  . hydrALAZINE (APRESOLINE) 50 MG tablet Take 50 mg by mouth 3 (three) times daily.    Marland Kitchen levothyroxine (SYNTHROID, LEVOTHROID) 75 MCG tablet TAKE 1 TABLET (75 MCG TOTAL) BY MOUTH DAILY. 90 tablet 1  .  lisinopril (PRINIVIL,ZESTRIL) 20 MG tablet Take 1 tablet by mouth 2 (two) times daily.   6  . Omega-3 Fatty Acids (FISH OIL) 1200 MG CAPS Take 1,200 mg by mouth daily.    . hydrALAZINE (APRESOLINE) 25 MG tablet Take 25 mg by mouth 3 (three) times daily.     No facility-administered medications prior to visit.    ROS Review of Systems  Constitutional: Negative for fever, chills, activity change and appetite change.  HENT: Positive for congestion, postnasal drip, rhinorrhea and sinus pressure. Negative for ear discharge, ear pain, hearing loss, nosebleeds, sneezing and trouble swallowing.   Respiratory: Negative for chest tightness and shortness of breath.   Cardiovascular: Negative for chest pain and palpitations.  Skin: Negative for rash.    Objective:  BP 146/60 mmHg  Pulse 60  Temp(Src) 97.2 F (36.2 C) (Oral)  Ht 5\' 5"  (1.651 m)  Wt 122 lb 12.8 oz (55.702 kg)  BMI 20.44 kg/m2  SpO2 97%  BP Readings from Last 3 Encounters:  08/29/15 146/60  06/22/15 172/63  05/09/15 180/72    Wt Readings from Last 3 Encounters:  08/29/15 122 lb 12.8 oz (55.702 kg)  06/22/15 126 lb (57.153 kg)  05/09/15 123 lb 9.6 oz (56.065 kg)     Physical Exam  Constitutional: She appears well-developed and well-nourished.  HENT:  Head: Normocephalic and atraumatic.  Right Ear: Tympanic membrane and external ear normal. No  decreased hearing is noted.  Left Ear: Tympanic membrane and external ear normal. No decreased hearing is noted.  Nose: Mucosal edema present. Right sinus exhibits maxillary sinus tenderness. Right sinus exhibits no frontal sinus tenderness. Left sinus exhibits maxillary sinus tenderness. Left sinus exhibits no frontal sinus tenderness.  Mouth/Throat: No oropharyngeal exudate or posterior oropharyngeal erythema.  Neck: No Brudzinski's sign noted.  Pulmonary/Chest: Breath sounds normal. No respiratory distress.  Lymphadenopathy:       Head (right side): No preauricular  adenopathy present.       Head (left side): No preauricular adenopathy present.       Right cervical: No superficial cervical adenopathy present.      Left cervical: No superficial cervical adenopathy present.     Lab Results  Component Value Date   WBC 3.8 08/29/2015   HGB 10.8* 02/22/2015   HCT 35.5 08/29/2015   PLT 184 08/29/2015   GLUCOSE 107* 06/15/2015   CHOL 123 06/15/2015   TRIG 114 06/15/2015   HDL 43 06/15/2015   LDLCALC 76 03/14/2014   ALT 24 06/15/2015   AST 27 06/15/2015   NA 138 06/15/2015   K 3.9 06/15/2015   CL 95* 06/15/2015   CREATININE 1.24* 06/15/2015   BUN 18 06/15/2015   CO2 26 06/15/2015   TSH 2.180 06/15/2015   INR 1.0 06/04/2007   HGBA1C 5.0 02/22/2015    Ct Abdomen Pelvis Wo Contrast  11/10/2014  CLINICAL DATA:  15 lb weight loss during the past year. Positive fecal occult blood test. EXAM: CT ABDOMEN AND PELVIS WITHOUT CONTRAST TECHNIQUE: Multidetector CT imaging of the abdomen and pelvis was performed following the standard protocol without IV contrast. COMPARISON:  Chest CT - 09/27/2008 FINDINGS: The lack of intravenous contrast limits the ability to evaluate solid abdominal organs. Normal hepatic contour. There are several layering radiopaque gallstones within the fundus of an otherwise normal-appearing gallbladder. No ascites. There is an ill-defined approximately 1.1 cm hypo attenuating (7 Hounsfield unit) lesion within the posterior aspect of the superior pole of the right kidney (image 27, series 2) which is incompletely characterized on this noncontrast examination though favored to represent a renal cyst. No renal stones. No renal stones are seen along the expected course of either ureter or the urinary bladder. Normal noncontrast appearance of the urinary bladder given degree distention. No urinary obstruction or perinephric stranding. Note is made of an indeterminate approximately 1.3 x 1.0 cm hypoattenuating (25 Hounsfield unit) nodule within the  lateral limb of the left adrenal gland (image 20, series 2), incompletely characterized on this noncontrast examination though grossly unchanged since the 2010 examination and thus of benign etiology. Normal noncontrast appearance of the right adrenal gland. Normal noncontrast appearance of the pancreas and spleen. Ingested enteric contrast extends to the level of the rectum. Scattered colonic diverticulosis without evidence of diverticulitis. The bowel is normal in course and caliber without wall thickening or evidence of obstruction. Normal noncontrast appearance of the retrocecal appendix. No pneumoperitoneum, pneumatosis or portal venous gas. Scattered atherosclerotic plaque within a tortuous but normal caliber abdominal aorta. No bulky retroperitoneal, mesenteric, pelvic or inguinal lymphadenopathy on this noncontrast examination. Normal noncontrast appearance of the pelvic organs for age. No discrete adnexal lesion. No free fluid within the pelvic cul-de-sac. Limited visualization the lower thorax demonstrates minimal subsegmental atelectasis within the inferior aspects of the right middle lobe and lingula. No discrete focal airspace opacities. No pleural effusion. Borderline cardiomegaly.  No pericardial effusion. No acute or aggressive osseous abnormalities. Mild-to-moderate  multilevel lumbar spine DDD, likely worse at L5-S1 with disc space height loss, endplate irregularity and small posteriorly directed disc osteophyte complex at this location. Mild-to-moderate bilateral facet degenerative change of the lower lumbar spine. Mild degenerative change of the bilateral hips. Regional soft tissues appear normal. IMPRESSION: 1. No definite explanation for patient's unintentional weight loss and positive fecal occult blood test. Specifically, no definitive colonic mass or evidence of enteric obstruction. If not recently performed, further evaluation with colonoscopy is recommended. 2. Colonic diverticulosis  without evidence of diverticulitis. 3. Suspected approximately 1.1 cm right-sided renal cyst. No evidence of nephrolithiasis or urinary obstruction. 4. Approximately 1.3 cm left-sided adrenal gland nodule, stable since the 2010 examination and thus of benign etiology, likely a lipid poor adrenal adenoma. Electronically Signed   By: Sandi Mariscal M.D.   On: 11/10/2014 08:41    Assessment & Plan:   Nonda was seen today for sinusitis.  Diagnoses and all orders for this visit:  Essential hypertension -     CBC with Differential/Platelet  Acute recurrent maxillary sinusitis -     CBC with Differential/Platelet  Other orders -     amoxicillin-clavulanate (AUGMENTIN) 875-125 MG tablet; Take 1 tablet by mouth 2 (two) times daily. Take all of this medication -     pseudoephedrine-guaifenesin (MUCINEX D) 60-600 MG 12 hr tablet; Take 1 tablet by mouth every 12 (twelve) hours. As needed for congestion   I have discontinued Ms. Derosia's fluconazole. I am also having her start on amoxicillin-clavulanate and pseudoephedrine-guaifenesin. Additionally, I am having her maintain her calcium-vitamin D, Fish Oil, aspirin EC, lisinopril, cloNIDine, flecainide, ferrous sulfate, hydrALAZINE, levothyroxine, atorvastatin, and atenolol-chlorthalidone.  Meds ordered this encounter  Medications  . DISCONTD: fluconazole (DIFLUCAN) 100 MG tablet    Sig:   . amoxicillin-clavulanate (AUGMENTIN) 875-125 MG tablet    Sig: Take 1 tablet by mouth 2 (two) times daily. Take all of this medication    Dispense:  20 tablet    Refill:  0  . pseudoephedrine-guaifenesin (MUCINEX D) 60-600 MG 12 hr tablet    Sig: Take 1 tablet by mouth every 12 (twelve) hours. As needed for congestion    Dispense:  20 tablet    Refill:  0     Follow-up: Return if symptoms worsen or fail to improve.  Claretta Fraise, M.D.

## 2015-08-30 LAB — CBC WITH DIFFERENTIAL/PLATELET
BASOS: 0 %
Basophils Absolute: 0 10*3/uL (ref 0.0–0.2)
EOS (ABSOLUTE): 0.1 10*3/uL (ref 0.0–0.4)
Eos: 1 %
Hematocrit: 35.5 % (ref 34.0–46.6)
Hemoglobin: 11.9 g/dL (ref 11.1–15.9)
IMMATURE GRANS (ABS): 0 10*3/uL (ref 0.0–0.1)
IMMATURE GRANULOCYTES: 0 %
Lymphocytes Absolute: 1 10*3/uL (ref 0.7–3.1)
Lymphs: 26 %
MCH: 28.2 pg (ref 26.6–33.0)
MCHC: 33.5 g/dL (ref 31.5–35.7)
MCV: 84 fL (ref 79–97)
Monocytes Absolute: 0.3 10*3/uL (ref 0.1–0.9)
Monocytes: 9 %
Neutrophils Absolute: 2.4 10*3/uL (ref 1.4–7.0)
Neutrophils: 64 %
PLATELETS: 184 10*3/uL (ref 150–379)
RBC: 4.22 x10E6/uL (ref 3.77–5.28)
RDW: 14 % (ref 12.3–15.4)
WBC: 3.8 10*3/uL (ref 3.4–10.8)

## 2015-09-14 ENCOUNTER — Ambulatory Visit (INDEPENDENT_AMBULATORY_CARE_PROVIDER_SITE_OTHER): Payer: Medicare Other | Admitting: Family Medicine

## 2015-09-14 ENCOUNTER — Encounter: Payer: Self-pay | Admitting: Family Medicine

## 2015-09-14 VITALS — BP 133/58 | HR 60 | Temp 97.6°F | Ht 65.0 in | Wt 124.2 lb

## 2015-09-14 DIAGNOSIS — M7022 Olecranon bursitis, left elbow: Secondary | ICD-10-CM | POA: Diagnosis not present

## 2015-09-14 NOTE — Patient Instructions (Addendum)
Use ACE wrap for 2 weeks. If not better, come in for follow up. Keep the steristrips as dry as possible on the right wrist. Watch for signs of infection - redness, swelling, increased pain and of course pus draining from it.

## 2015-09-14 NOTE — Progress Notes (Signed)
   Subjective:  Patient ID: [name], female    DOB: 29-Sep-1933  Age: 82 y.o. MRN: [id number]  CC: Cyst   HPI [name] presents for One day of swelling in the left elbow. Pain at left forearm that is minor all day yesterday. Severity- "Just there enough to be noticed." Not affecting motion.   History [name] has a past medical history of Nodule of right lung; Hyperlipidemia; Hypertension; Hypothyroidism; Renal insufficiency; Arthritis; CKD (chronic kidney disease), stage III; Anemia; Renal vascular disease; and Asthma.   She has past surgical history that includes Thyroidectomy, partial.   Her family history includes Hypertension in her brother; Stroke in her father; Thyroid disease in her mother.She reports that she has never smoked. She does not have any smokeless tobacco history on file. She reports that she does not drink alcohol or use illicit drugs.    ROS Review of Systems  Constitutional: Negative for fever.  HENT: Negative for congestion, rhinorrhea and sore throat.   Respiratory: Negative for cough and shortness of breath.   Cardiovascular: Negative for chest pain and palpitations.  Gastrointestinal: Negative for abdominal pain.  Musculoskeletal: Positive for myalgias and joint swelling. Negative for arthralgias.    Objective:  BP 133/58 mmHg  Pulse 60  Temp(Src) 97.6 F (36.4 C) (Oral)  Ht 5\' 5"  (1.651 m)  Wt 124 lb 3.2 oz (56.337 kg)  BMI 20.67 kg/m2  BP Readings from Last 3 Encounters:  09/14/15 133/58  08/29/15 146/60  06/22/15 172/63    Wt Readings from Last 3 Encounters:  09/14/15 124 lb 3.2 oz (56.337 kg)  08/29/15 122 lb 12.8 oz (55.702 kg)  06/22/15 126 lb (57.153 kg)     Physical Exam  Constitutional: She appears well-developed and well-nourished.  HENT:  Head: Normocephalic.  Cardiovascular: Normal rate and regular rhythm.   No murmur heard. Pulmonary/Chest: Effort normal and breath sounds normal.  Musculoskeletal: She exhibits  edema (minimal at left olecranon).     Lab Results  Component Value Date   WBC 3.8 08/29/2015   HGB 10.8* 02/22/2015   HCT 35.5 08/29/2015   PLT 184 08/29/2015   GLUCOSE 107* 06/15/2015   CHOL 123 06/15/2015   TRIG 114 06/15/2015   HDL 43 06/15/2015   LDLCALC 76 03/14/2014   ALT 24 06/15/2015   AST 27 06/15/2015   NA 138 06/15/2015   K 3.9 06/15/2015   CL 95* 06/15/2015   CREATININE 1.24* 06/15/2015   BUN 18 06/15/2015   CO2 26 06/15/2015   TSH 2.180 06/15/2015   INR 1.0 06/04/2007   HGBA1C 5.0 02/22/2015     Assessment & Plan:   [name] was seen today for cyst.  Diagnoses and all orders for this visit:  Olecranon bursitis of left elbow -     Apply ace wrap     I have discontinued Ms. Zumwalt's amoxicillin-clavulanate. I am also having her maintain her calcium-vitamin D, Fish Oil, aspirin EC, lisinopril, cloNIDine, flecainide, ferrous sulfate, hydrALAZINE, levothyroxine, atorvastatin, and atenolol-chlorthalidone.  No orders of the defined types were placed in this encounter.   Use ACE wrap for 2 weeks. If not better, come in for follow up. Keep the steristrips as dry as possible on the right wrist. Watch for signs of infection - redness, swelling, increased pain and of course pus draining from it.  Follow-up: Return in about 2 weeks (around 09/28/2015), or if symptoms worsen or fail to improve.  Claretta Fraise, M.D.

## 2015-09-27 ENCOUNTER — Encounter: Payer: Self-pay | Admitting: *Deleted

## 2015-09-27 ENCOUNTER — Telehealth: Payer: Self-pay | Admitting: Family Medicine

## 2015-09-27 NOTE — Telephone Encounter (Signed)
Patient advised that since it is still swollen some that I advise that she keep her follow up appointment tomorrow with [facility].

## 2015-09-28 ENCOUNTER — Encounter: Payer: Self-pay | Admitting: Family Medicine

## 2015-09-28 ENCOUNTER — Ambulatory Visit (INDEPENDENT_AMBULATORY_CARE_PROVIDER_SITE_OTHER): Payer: Medicare Other | Admitting: Family Medicine

## 2015-09-28 VITALS — BP 166/61 | HR 62 | Temp 97.4°F | Ht 65.0 in | Wt 125.0 lb

## 2015-09-28 DIAGNOSIS — I10 Essential (primary) hypertension: Secondary | ICD-10-CM

## 2015-09-28 DIAGNOSIS — M7022 Olecranon bursitis, left elbow: Secondary | ICD-10-CM | POA: Diagnosis not present

## 2015-09-28 NOTE — Progress Notes (Signed)
Subjective:  Patient ID: [name], female    DOB: 1934-06-05  Age: 82 y.o. MRN: [id number]  CC: Bursitis   HPI [name] presents for continued swelling in the left elbow. Pain at left forearm that is minor all day yesterday. Severity- "Just there enough to be noticed." Not affecting motion. Feels ace wrap bothered her more than the bursa   follow-up of hypertension. Patient has no history of headache chest pain or shortness of breath or recent cough. Patient also denies symptoms of TIA such as numbness weakness lateralizing. Patient checks  blood pressure at home and has not had any elevated readings recently. Patient denies side effects from his medication. Takes cllonidine prn for SBP>160, but not checking BP regularly. Pt. Under care of kidney doctor & following his advice r.e. Prn clonidine. She had to cut back on hydralazine due to nausea. Was on 36 TID now 50 BID    History [name] has a past medical history of Nodule of right lung; Hyperlipidemia; Hypertension; Hypothyroidism; Renal insufficiency; Arthritis; CKD (chronic kidney disease), stage III; Anemia; Renal vascular disease; and Asthma.   She has past surgical history that includes Thyroidectomy, partial.   Her family history includes Hypertension in her brother; Stroke in her father; Thyroid disease in her mother.She reports that she has never smoked. She does not have any smokeless tobacco history on file. She reports that she does not drink alcohol or use illicit drugs.    ROS Review of Systems  Constitutional: Negative for fever.  HENT: Negative for congestion, rhinorrhea and sore throat.   Respiratory: Negative for cough and shortness of breath.   Cardiovascular: Negative for chest pain and palpitations.  Gastrointestinal: Negative for abdominal pain.  Musculoskeletal: Positive for myalgias and joint swelling. Negative for arthralgias.    Objective:  BP 166/61 mmHg  Pulse 62  Temp(Src) 97.4 F (36.3 C)  (Oral)  Ht 5\' 5"  (1.651 m)  Wt 125 lb (56.7 kg)  BMI 20.80 kg/m2  SpO2 96%  BP Readings from Last 3 Encounters:  09/28/15 166/61  09/14/15 133/58  08/29/15 146/60    Wt Readings from Last 3 Encounters:  09/28/15 125 lb (56.7 kg)  09/14/15 124 lb 3.2 oz (56.337 kg)  08/29/15 122 lb 12.8 oz (55.702 kg)     Physical Exam  Constitutional: She appears well-developed and well-nourished.  HENT:  Head: Normocephalic.  Cardiovascular: Normal rate and regular rhythm.   No murmur heard. Pulmonary/Chest: Effort normal and breath sounds normal.  Musculoskeletal: She exhibits edema (minimal at left olecranon).     Lab Results  Component Value Date   WBC 3.8 08/29/2015   HGB 10.8* 02/22/2015   HCT 35.5 08/29/2015   PLT 184 08/29/2015   GLUCOSE 107* 06/15/2015   CHOL 123 06/15/2015   TRIG 114 06/15/2015   HDL 43 06/15/2015   LDLCALC 76 03/14/2014   ALT 24 06/15/2015   AST 27 06/15/2015   NA 138 06/15/2015   K 3.9 06/15/2015   CL 95* 06/15/2015   CREATININE 1.24* 06/15/2015   BUN 18 06/15/2015   CO2 26 06/15/2015   TSH 2.180 06/15/2015   INR 1.0 06/04/2007   HGBA1C 5.0 02/22/2015     Assessment & Plan:   [name] was seen today for bursitis.  Diagnoses and all orders for this visit:  Olecranon bursitis of left elbow  Essential hypertension      I am having [name] maintain her calcium-vitamin D, Fish Oil, aspirin EC, lisinopril, cloNIDine,  flecainide, ferrous sulfate, hydrALAZINE, levothyroxine, atorvastatin, and atenolol-chlorthalidone.  No orders of the defined types were placed in this encounter.   I demonstrated proper way to wrap ace to avoid wrinkling in the joint. Pt. Will consider aspiration or removal after lengthy dsicussion of pros and cons  Follow-up: Return if symptoms worsen or fail to improve.  Claretta Fraise, M.D.

## 2015-10-12 DIAGNOSIS — N183 Chronic kidney disease, stage 3 (moderate): Secondary | ICD-10-CM | POA: Diagnosis not present

## 2015-10-12 DIAGNOSIS — E785 Hyperlipidemia, unspecified: Secondary | ICD-10-CM | POA: Diagnosis not present

## 2015-10-12 DIAGNOSIS — N2889 Other specified disorders of kidney and ureter: Secondary | ICD-10-CM | POA: Diagnosis not present

## 2015-10-12 DIAGNOSIS — I129 Hypertensive chronic kidney disease with stage 1 through stage 4 chronic kidney disease, or unspecified chronic kidney disease: Secondary | ICD-10-CM | POA: Diagnosis not present

## 2015-10-14 ENCOUNTER — Emergency Department (HOSPITAL_COMMUNITY): Payer: Medicare Other

## 2015-10-14 ENCOUNTER — Inpatient Hospital Stay (HOSPITAL_COMMUNITY)
Admission: EM | Admit: 2015-10-14 | Discharge: 2015-10-17 | DRG: 304 | Disposition: A | Discharge status: Home / Self Care | Payer: Medicare Other | Attending: Internal Medicine | Admitting: Internal Medicine

## 2015-10-14 ENCOUNTER — Encounter (HOSPITAL_COMMUNITY): Payer: Self-pay | Admitting: Emergency Medicine

## 2015-10-14 DIAGNOSIS — T462X5A Adverse effect of other antidysrhythmic drugs, initial encounter: Secondary | ICD-10-CM | POA: Diagnosis present

## 2015-10-14 DIAGNOSIS — I480 Paroxysmal atrial fibrillation: Secondary | ICD-10-CM | POA: Diagnosis present

## 2015-10-14 DIAGNOSIS — J45909 Unspecified asthma, uncomplicated: Secondary | ICD-10-CM | POA: Diagnosis present

## 2015-10-14 DIAGNOSIS — E785 Hyperlipidemia, unspecified: Secondary | ICD-10-CM | POA: Diagnosis present

## 2015-10-14 DIAGNOSIS — Z823 Family history of stroke: Secondary | ICD-10-CM | POA: Diagnosis not present

## 2015-10-14 DIAGNOSIS — E871 Hypo-osmolality and hyponatremia: Secondary | ICD-10-CM | POA: Diagnosis present

## 2015-10-14 DIAGNOSIS — E039 Hypothyroidism, unspecified: Secondary | ICD-10-CM | POA: Diagnosis present

## 2015-10-14 DIAGNOSIS — D61811 Other drug-induced pancytopenia: Secondary | ICD-10-CM | POA: Diagnosis not present

## 2015-10-14 DIAGNOSIS — R51 Headache: Secondary | ICD-10-CM | POA: Diagnosis not present

## 2015-10-14 DIAGNOSIS — I5032 Chronic diastolic (congestive) heart failure: Secondary | ICD-10-CM | POA: Diagnosis not present

## 2015-10-14 DIAGNOSIS — D61818 Other pancytopenia: Secondary | ICD-10-CM | POA: Diagnosis present

## 2015-10-14 DIAGNOSIS — I160 Hypertensive urgency: Secondary | ICD-10-CM | POA: Diagnosis present

## 2015-10-14 DIAGNOSIS — T502X5A Adverse effect of carbonic-anhydrase inhibitors, benzothiadiazides and other diuretics, initial encounter: Secondary | ICD-10-CM | POA: Diagnosis not present

## 2015-10-14 DIAGNOSIS — N183 Chronic kidney disease, stage 3 unspecified: Secondary | ICD-10-CM | POA: Diagnosis present

## 2015-10-14 DIAGNOSIS — E876 Hypokalemia: Secondary | ICD-10-CM | POA: Diagnosis present

## 2015-10-14 DIAGNOSIS — I130 Hypertensive heart and chronic kidney disease with heart failure and stage 1 through stage 4 chronic kidney disease, or unspecified chronic kidney disease: Secondary | ICD-10-CM | POA: Diagnosis present

## 2015-10-14 DIAGNOSIS — R531 Weakness: Secondary | ICD-10-CM | POA: Diagnosis not present

## 2015-10-14 DIAGNOSIS — R030 Elevated blood-pressure reading, without diagnosis of hypertension: Secondary | ICD-10-CM | POA: Diagnosis not present

## 2015-10-14 DIAGNOSIS — I701 Atherosclerosis of renal artery: Secondary | ICD-10-CM | POA: Diagnosis not present

## 2015-10-14 DIAGNOSIS — I129 Hypertensive chronic kidney disease with stage 1 through stage 4 chronic kidney disease, or unspecified chronic kidney disease: Secondary | ICD-10-CM | POA: Diagnosis not present

## 2015-10-14 DIAGNOSIS — Z8249 Family history of ischemic heart disease and other diseases of the circulatory system: Secondary | ICD-10-CM | POA: Diagnosis not present

## 2015-10-14 DIAGNOSIS — I10 Essential (primary) hypertension: Secondary | ICD-10-CM | POA: Insufficient documentation

## 2015-10-14 HISTORY — DX: Atherosclerosis of renal artery: I70.1

## 2015-10-14 HISTORY — DX: Hypo-osmolality and hyponatremia: E87.1

## 2015-10-14 LAB — URINALYSIS, ROUTINE W REFLEX MICROSCOPIC
BILIRUBIN URINE: NEGATIVE
GLUCOSE, UA: NEGATIVE mg/dL
Ketones, ur: NEGATIVE mg/dL
Leukocytes, UA: NEGATIVE
NITRITE: NEGATIVE
PH: 7 (ref 5.0–8.0)
Protein, ur: NEGATIVE mg/dL
SPECIFIC GRAVITY, URINE: 1.01 (ref 1.005–1.030)

## 2015-10-14 LAB — COMPREHENSIVE METABOLIC PANEL
ALBUMIN: 4.1 g/dL (ref 3.5–5.0)
ALT: 32 U/L (ref 14–54)
AST: 33 U/L (ref 15–41)
Alkaline Phosphatase: 61 U/L (ref 38–126)
Anion gap: 10 (ref 5–15)
BUN: 20 mg/dL (ref 6–20)
CHLORIDE: 92 mmol/L — AB (ref 101–111)
CO2: 27 mmol/L (ref 22–32)
CREATININE: 1.13 mg/dL — AB (ref 0.44–1.00)
Calcium: 8.6 mg/dL — ABNORMAL LOW (ref 8.9–10.3)
GFR calc Af Amer: 52 mL/min — ABNORMAL LOW (ref >60)
GFR calc non Af Amer: 45 mL/min — ABNORMAL LOW (ref >60)
GLUCOSE: 124 mg/dL — AB (ref 65–99)
POTASSIUM: 3.3 mmol/L — AB (ref 3.5–5.1)
Sodium: 129 mmol/L — ABNORMAL LOW (ref 135–145)
Total Bilirubin: 0.6 mg/dL (ref 0.3–1.2)
Total Protein: 7.1 g/dL (ref 6.5–8.1)

## 2015-10-14 LAB — CBC WITH DIFFERENTIAL/PLATELET
Basophils Absolute: 0 10*3/uL (ref 0.0–0.1)
Basophils Relative: 0 %
Eosinophils Absolute: 0 10*3/uL (ref 0.0–0.7)
Eosinophils Relative: 1 %
HEMATOCRIT: 34 % — AB (ref 36.0–46.0)
HEMOGLOBIN: 11.4 g/dL — AB (ref 12.0–15.0)
LYMPHS ABS: 0.8 10*3/uL (ref 0.7–4.0)
LYMPHS PCT: 21 %
MCH: 28.7 pg (ref 26.0–34.0)
MCHC: 33.5 g/dL (ref 30.0–36.0)
MCV: 85.6 fL (ref 78.0–100.0)
MONOS PCT: 12 %
Monocytes Absolute: 0.5 10*3/uL (ref 0.1–1.0)
NEUTROS ABS: 2.5 10*3/uL (ref 1.7–7.7)
NEUTROS PCT: 66 %
Platelets: 128 10*3/uL — ABNORMAL LOW (ref 150–400)
RBC: 3.97 MIL/uL (ref 3.87–5.11)
RDW: 14 % (ref 11.5–15.5)
WBC: 3.8 10*3/uL — ABNORMAL LOW (ref 4.0–10.5)

## 2015-10-14 LAB — URINE MICROSCOPIC-ADD ON

## 2015-10-14 LAB — BRAIN NATRIURETIC PEPTIDE: B NATRIURETIC PEPTIDE 5: 275 pg/mL — AB (ref 0.0–100.0)

## 2015-10-14 LAB — LACTIC ACID, PLASMA: Lactic Acid, Venous: 1 mmol/L (ref 0.5–2.0)

## 2015-10-14 LAB — TROPONIN I: Troponin I: <0.03 ng/mL (ref <0.031)

## 2015-10-14 MED ORDER — HYDRALAZINE HCL 50 MG PO TABS
50.0000 mg | ORAL_TABLET | Freq: Once | ORAL | Status: AC
Start: 2015-10-14 — End: 2015-10-14
  Administered 2015-10-14: 50 mg via ORAL
  Filled 2015-10-14: qty 1

## 2015-10-14 MED ORDER — HYDRALAZINE HCL 25 MG PO TABS
ORAL_TABLET | ORAL | Status: AC
  Filled 2015-10-14: qty 2

## 2015-10-14 MED ORDER — LISINOPRIL 10 MG PO TABS
20.0000 mg | ORAL_TABLET | Freq: Once | ORAL | Status: AC
  Administered 2015-10-14: 20 mg via ORAL
  Filled 2015-10-14: qty 2

## 2015-10-14 MED ORDER — SODIUM CHLORIDE 0.9 % IV SOLN
INTRAVENOUS | Status: DC
  Administered 2015-10-14 – 2015-10-16 (×4): via INTRAVENOUS

## 2015-10-14 NOTE — ED Notes (Signed)
Pt states she hasn't felt well all day and took 3 of her clonidine pills today and rechecked it and "it was still 206/89".

## 2015-10-14 NOTE — ED Provider Notes (Signed)
CSN: [id number]     Arrival date & time 10/14/15  1959 History   First MD Initiated Contact with Patient 10/14/15 2039     Chief Complaint  Patient presents with  . Hypertension  . Fatigue     HPI Pt was seen at 2045. Per pt, c/o gradual onset and persistence of constant generalized fatigue that began this morning. Pt states she has "just been laying on the couch all day." Pt states she took her BP and "it was high." Pt states has been taking her BP periodically all week and "the top number has been in the 180-190's." Pt was evaluated by her Renal MD 2 days ago and told to take PO clonidine if her SBP 180<. Pt states today "the top number was over 200," so she took clonidine as instructed. Pt continued to take her BP "and it was still high," so she took 2 more doses of clonidine. Pt states she came to the ED for evaluation "because it's still high." Denies any specific complaint. Denies CP/palpitations, no SOB/cough, no abd pain, no N/V/D, no visual changes, no focal motor weakness, no tingling/numbness in extremities, no ataxia, no slurred speech, no facial droop.     Past Medical History  Diagnosis Date  . Nodule of right lung     Right upper lobe  . Hyperlipidemia   . Hypertension   . Hypothyroidism   . Renal insufficiency     Chronic  . Arthritis   . CKD (chronic kidney disease), stage III   . Anemia   . Renal vascular disease   . Asthma    Past Surgical History  Procedure Laterality Date  . Thyroidectomy, partial     Family History  Problem Relation Age of Onset  . Thyroid disease Mother   . Stroke Father   . Hypertension Brother    Social History  Substance Use Topics  . Smoking status: Never Smoker   . Smokeless tobacco: None     Comment: Pt denies cigarettes  . Alcohol Use: No    Review of Systems ROS: Statement: All systems negative except as marked or noted in the HPI; Constitutional: Negative for fever and chills. +generalized fatigue. ; ; Eyes: Negative for  eye pain, redness and discharge. ; ; ENMT: Negative for ear pain, hoarseness, nasal congestion, sinus pressure and sore throat. ; ; Cardiovascular: Negative for chest pain, palpitations, diaphoresis, dyspnea and peripheral edema. ; ; Respiratory: Negative for cough, wheezing and stridor. ; ; Gastrointestinal: Negative for nausea, vomiting, diarrhea, abdominal pain, blood in stool, hematemesis, jaundice and rectal bleeding. . ; ; Genitourinary: Negative for dysuria, flank pain and hematuria. ; ; Musculoskeletal: Negative for back pain and neck pain. Negative for swelling and trauma.; ; Skin: Negative for pruritus, rash, abrasions, blisters, bruising and skin lesion.; ; Neuro: Negative for headache, lightheadedness and neck stiffness. Negative for altered level of consciousness , altered mental status, extremity weakness, paresthesias, involuntary movement, seizure and syncope.      Allergies  Clonidine derivatives; Isosorbide; Sulfonamide derivatives; and Terazosin  Home Medications   Prior to Admission medications   Medication Sig Start Date End Date Taking? Authorizing Provider  aspirin EC 81 MG tablet Take 81 mg by mouth every other day.   Yes Historical Provider, MD  atenolol-chlorthalidone (TENORETIC) 50-25 MG tablet Take 1 tablet by mouth daily. 07/24/15  Yes Chipper Herb, MD  atorvastatin (LIPITOR) 20 MG tablet TAKE 1/2 TABLET DAILY 06/30/15  Yes Chipper Herb, MD  calcium-vitamin  D (OSCAL WITH D) 500-200 MG-UNIT per tablet Take 1 tablet by mouth daily.     Yes Historical Provider, MD  cloNIDine (CATAPRES) 0.1 MG tablet Take 0.1 mg by mouth daily as needed. 1 tablet as needed for systolic blood pressure over 180 09/13/14  Yes Historical Provider, MD  ferrous sulfate ([employer] IRON) 325 (65 FE) MG tablet Take 325 mg by mouth 2 (two) times daily with a meal.   Yes Historical Provider, MD  flecainide (TAMBOCOR) 50 MG tablet TAKE 1 TABLET (50 MG TOTAL) BY MOUTH 2 (TWO) TIMES DAILY. 02/05/15  Yes Deboraha Sprang, MD  hydrALAZINE (APRESOLINE) 50 MG tablet Take 50 mg by mouth 2 (two) times daily.    Yes Historical Provider, MD  hydroxypropyl methylcellulose / hypromellose (ISOPTO TEARS / GONIOVISC) 2.5 % ophthalmic solution Place 1 drop into both eyes as needed for dry eyes.   Yes Historical Provider, MD  levothyroxine (SYNTHROID, LEVOTHROID) 75 MCG tablet TAKE 1 TABLET (75 MCG TOTAL) BY MOUTH DAILY. 05/28/15  Yes Chipper Herb, MD  lisinopril (PRINIVIL,ZESTRIL) 20 MG tablet Take 1 tablet by mouth 2 (two) times daily.  05/26/14  Yes Historical Provider, MD  Omega-3 Fatty Acids (FISH OIL) 1200 MG CAPS Take 1,200 mg by mouth daily.   Yes Historical Provider, MD   BP 202/68 mmHg  Pulse 65  Temp(Src) 97.6 F (36.4 C) (Oral)  Resp 15  Ht 5\' 6"  (1.676 m)  Wt 124 lb (56.246 kg)  BMI 20.02 kg/m2  SpO2 93%   21:22 Orthostatic Vital Signs AD  Orthostatic Lying  - BP- Lying: 207/71 mmHg ; Pulse- Lying: 64  Orthostatic Sitting - BP- Sitting: 231/75 mmHg ; Pulse- Sitting: 63  Orthostatic Standing at 0 minutes - BP- Standing at 0 minutes: 226/71 mmHg ; Pulse- Standing at 0 minutes: 65     Patient Vitals for the past 24 hrs:  BP Temp Temp src Pulse Resp SpO2 Height Weight  10/14/15 2330 187/66 mmHg - - (!) 56 14 96 % - -  10/14/15 2220 (!) 202/68 mmHg - - 65 - - - -  10/14/15 2200 - - - - 16 - - -  10/14/15 2145 - - - (!) 58 15 99 % - -  10/14/15 2125 (!) 226/71 mmHg - - 63 15 93 % - -  10/14/15 2123 (!) 231/75 mmHg - - 66 17 97 % - -  10/14/15 2115 - - - (!) 59 13 97 % - -  10/14/15 2045 - - - 60 - 100 % - -  10/14/15 2040 (!) 235/83 mmHg - - - - - - -  10/14/15 2004 (!) 218/74 mmHg 97.6 F (36.4 C) Oral (!) 56 16 96 % 5\' 6"  (1.676 m) 124 lb (56.246 kg)     Physical Exam  2050: Physical examination:  Nursing notes reviewed; Vital signs and O2 SAT reviewed;  Constitutional: Well developed, Well nourished, Well hydrated, In no acute distress. Appears fatigued.; Head:  Normocephalic,  atraumatic; Eyes: EOMI, PERRL, No scleral icterus; ENMT: Mouth and pharynx normal, Mucous membranes moist; Neck: Supple, Full range of motion, No lymphadenopathy; Cardiovascular: Regular rate and rhythm, No gallop; Respiratory: Breath sounds clear & equal bilaterally, No wheezes.  Speaking full sentences with ease, Normal respiratory effort/excursion; Chest: Nontender, Movement normal; Abdomen: Soft, Nontender, Nondistended, Normal bowel sounds; Genitourinary: No CVA tenderness; Extremities: Pulses normal, No tenderness, No edema, No calf edema or asymmetry.; Neuro: AA&Ox3, Major CN grossly intact. No facial droop. Speech clear.  No gross focal motor or sensory deficits in extremities.; Skin: Color normal, Warm, Dry.   ED Course  Procedures (including critical care time) Labs Review  Imaging Review  I have personally reviewed and evaluated these images and lab results as part of my medical decision-making.   EKG Interpretation   Date/Time:  Sunday October 14 2015 21:15:56 EST Ventricular Rate:  60 PR Interval:  233 QRS Duration: 114 QT Interval:  541 QTC Calculation: 541 R Axis:   79 Text Interpretation:  Sinus rhythm Prolonged PR interval Probable left  atrial enlargement Incomplete right bundle branch block Left ventricular  hypertrophy Prolonged QT interval When compared with ECG of 04/16/2014 QT  has lengthened Confirmed by [facility]  MD, Nunzio Cory [id number]) on 10/14/2015  10:03:14 PM      MDM  MDM Reviewed: previous chart, nursing note and vitals Reviewed previous: labs and ECG Interpretation: labs, ECG, x-ray and CT scan Total time providing critical care: 30-74 minutes. This excludes time spent performing separately reportable procedures and services. Consults: admitting MD   CRITICAL CARE Performed by: Alfonzo Feller Total critical care time: 35 minutes Critical care time was exclusive of separately billable procedures and treating other patients. Critical care was  necessary to treat or prevent imminent or life-threatening deterioration. Critical care was time spent personally by me on the following activities: development of treatment plan with patient and/or surrogate as well as nursing, discussions with consultants, evaluation of patient's response to treatment, examination of patient, obtaining history from patient or surrogate, ordering and performing treatments and interventions, ordering and review of laboratory studies, ordering and review of radiographic studies, pulse oximetry and re-evaluation of patient's condition.  Results for orders placed or performed during the hospital encounter of 10/14/15  Comprehensive metabolic panel  Result Value Ref Range   Sodium 129 (L) 135 - 145 mmol/L   Potassium 3.3 (L) 3.5 - 5.1 mmol/L   Chloride 92 (L) 101 - 111 mmol/L   CO2 27 22 - 32 mmol/L   Glucose, Bld 124 (H) 65 - 99 mg/dL   BUN 20 6 - 20 mg/dL   Creatinine, Ser 1.13 (H) 0.44 - 1.00 mg/dL   Calcium 8.6 (L) 8.9 - 10.3 mg/dL   Total Protein 7.1 6.5 - 8.1 g/dL   Albumin 4.1 3.5 - 5.0 g/dL   AST 33 15 - 41 U/L   ALT 32 14 - 54 U/L   Alkaline Phosphatase 61 38 - 126 U/L   Total Bilirubin 0.6 0.3 - 1.2 mg/dL   GFR calc non Af Amer 45 (L) >60 mL/min   GFR calc Af Amer 52 (L) >60 mL/min   Anion gap 10 5 - 15  Troponin I  Result Value Ref Range   Troponin I <0.03 <0.031 ng/mL  Lactic acid, plasma  Result Value Ref Range   Lactic Acid, Venous 1.0 0.5 - 2.0 mmol/L  CBC with Differential  Result Value Ref Range   WBC 3.8 (L) 4.0 - 10.5 K/uL   RBC 3.97 3.87 - 5.11 MIL/uL   Hemoglobin 11.4 (L) 12.0 - 15.0 g/dL   HCT 34.0 (L) 36.0 - 46.0 %   MCV 85.6 78.0 - 100.0 fL   MCH 28.7 26.0 - 34.0 pg   MCHC 33.5 30.0 - 36.0 g/dL   RDW 14.0 11.5 - 15.5 %   Platelets 128 (L) 150 - 400 K/uL   Neutrophils Relative % 66 %   Neutro Abs 2.5 1.7 - 7.7 K/uL   Lymphocytes Relative 21 %  Lymphs Abs 0.8 0.7 - 4.0 K/uL   Monocytes Relative 12 %   Monocytes Absolute  0.5 0.1 - 1.0 K/uL   Eosinophils Relative 1 %   Eosinophils Absolute 0.0 0.0 - 0.7 K/uL   Basophils Relative 0 %   Basophils Absolute 0.0 0.0 - 0.1 K/uL  Urinalysis, Routine w reflex microscopic  Result Value Ref Range   Color, Urine YELLOW YELLOW   APPearance CLEAR CLEAR   Specific Gravity, Urine 1.010 1.005 - 1.030   pH 7.0 5.0 - 8.0   Glucose, UA NEGATIVE NEGATIVE mg/dL   Hgb urine dipstick TRACE (A) NEGATIVE   Bilirubin Urine NEGATIVE NEGATIVE   Ketones, ur NEGATIVE NEGATIVE mg/dL   Protein, ur NEGATIVE NEGATIVE mg/dL   Nitrite NEGATIVE NEGATIVE   Leukocytes, UA NEGATIVE NEGATIVE  Urine microscopic-add on  Result Value Ref Range   Squamous Epithelial / LPF 0-5 (A) NONE SEEN   WBC, UA 0-5 0 - 5 WBC/hpf   RBC / HPF 0-5 0 - 5 RBC/hpf   Bacteria, UA RARE (A) NONE SEEN  Brain natriuretic peptide  Result Value Ref Range   B Natriuretic Peptide 275.0 (H) 0.0 - 100.0 pg/mL   Dg Chest 2 View 10/14/2015  CLINICAL DATA:  Acute onset of generalized weakness and high blood pressure. Initial encounter. EXAM: CHEST  2 VIEW COMPARISON:  Chest radiograph performed 02/06/2014 FINDINGS: The lungs are well-aerated and clear. There is no evidence of focal opacification, pleural effusion or pneumothorax. The heart is mildly enlarged. No acute osseous abnormalities are seen. IMPRESSION: Mild cardiomegaly.  Lungs remain grossly clear. Electronically Signed   By: Garald Balding M.D.   On: 10/14/2015 22:04   Ct Head Wo Contrast 10/14/2015  CLINICAL DATA:  Acute onset of headache.  Initial encounter. EXAM: CT HEAD WITHOUT CONTRAST TECHNIQUE: Contiguous axial images were obtained from the base of the skull through the vertex without intravenous contrast. COMPARISON:  CT of the head performed 04/02/2014, and MRI of the brain performed 04/03/2014 FINDINGS: There is no evidence of acute infarction, mass lesion, or intra- or extra-axial hemorrhage on CT. Scattered periventricular and subcortical white matter  change likely reflects small vessel ischemic microangiopathy. The posterior fossa, including the cerebellum, brainstem and fourth ventricle, is within normal limits. The third and lateral ventricles, and basal ganglia are unremarkable in appearance. The cerebral hemispheres are symmetric in appearance, with normal gray-white differentiation. No mass effect or midline shift is seen. There is no evidence of fracture; visualized osseous structures are unremarkable in appearance. The orbits are within normal limits. The paranasal sinuses and mastoid air cells are well-aerated. No significant soft tissue abnormalities are seen. IMPRESSION: 1. No acute intracranial pathology seen on CT. 2. Mild small vessel ischemic microangiopathy. Electronically Signed   By: Garald Balding M.D.   On: 10/14/2015 22:39    Results for SIBLE, GATZ (MRN [id number]) as of 10/14/2015 23:16  Ref. Range 11/15/2014 09:41 12/12/2014 09:58 03/14/2015 08:29 06/15/2015 13:09 10/14/2015 21:05  Sodium Latest Ref Range: 135-145 mmol/L 138 139 138 138 129 (L)     2230:  Pt states she has not taken the 2nd daily dose of her hydralazine or lisinopril today. Will dose both now.   2335:  New hyponatremia on today's labs. Pt usual BID meds given, with slow improvement in BP.  Dx and testing d/w pt and family.  Questions answered.  Verb understanding, agreeable to observation admit. T/C to [facility] Dr. Arnoldo Morale, case discussed, including:  HPI, pertinent PM/SHx, VS/PE, dx  testing, ED course and treatment:  Agreeable to admit, requests to write temporary orders, obtain tele bed to team [facility].   Francine Graven, DO 10/17/15 2119

## 2015-10-15 ENCOUNTER — Inpatient Hospital Stay (HOSPITAL_COMMUNITY): Payer: Medicare Other

## 2015-10-15 DIAGNOSIS — T462X5A Adverse effect of other antidysrhythmic drugs, initial encounter: Secondary | ICD-10-CM | POA: Diagnosis present

## 2015-10-15 DIAGNOSIS — I480 Paroxysmal atrial fibrillation: Secondary | ICD-10-CM | POA: Diagnosis present

## 2015-10-15 DIAGNOSIS — I701 Atherosclerosis of renal artery: Secondary | ICD-10-CM | POA: Diagnosis not present

## 2015-10-15 DIAGNOSIS — E785 Hyperlipidemia, unspecified: Secondary | ICD-10-CM | POA: Diagnosis present

## 2015-10-15 DIAGNOSIS — E871 Hypo-osmolality and hyponatremia: Secondary | ICD-10-CM

## 2015-10-15 DIAGNOSIS — J45909 Unspecified asthma, uncomplicated: Secondary | ICD-10-CM | POA: Diagnosis present

## 2015-10-15 DIAGNOSIS — I130 Hypertensive heart and chronic kidney disease with heart failure and stage 1 through stage 4 chronic kidney disease, or unspecified chronic kidney disease: Secondary | ICD-10-CM | POA: Diagnosis present

## 2015-10-15 DIAGNOSIS — I160 Hypertensive urgency: Principal | ICD-10-CM

## 2015-10-15 DIAGNOSIS — T502X5A Adverse effect of carbonic-anhydrase inhibitors, benzothiadiazides and other diuretics, initial encounter: Secondary | ICD-10-CM | POA: Diagnosis present

## 2015-10-15 DIAGNOSIS — E039 Hypothyroidism, unspecified: Secondary | ICD-10-CM | POA: Diagnosis present

## 2015-10-15 DIAGNOSIS — N183 Chronic kidney disease, stage 3 (moderate): Secondary | ICD-10-CM

## 2015-10-15 DIAGNOSIS — D61818 Other pancytopenia: Secondary | ICD-10-CM | POA: Diagnosis present

## 2015-10-15 DIAGNOSIS — I10 Essential (primary) hypertension: Secondary | ICD-10-CM | POA: Diagnosis not present

## 2015-10-15 DIAGNOSIS — E876 Hypokalemia: Secondary | ICD-10-CM | POA: Diagnosis not present

## 2015-10-15 DIAGNOSIS — Z823 Family history of stroke: Secondary | ICD-10-CM | POA: Diagnosis not present

## 2015-10-15 DIAGNOSIS — I5032 Chronic diastolic (congestive) heart failure: Secondary | ICD-10-CM | POA: Diagnosis not present

## 2015-10-15 DIAGNOSIS — R51 Headache: Secondary | ICD-10-CM | POA: Diagnosis present

## 2015-10-15 DIAGNOSIS — D61811 Other drug-induced pancytopenia: Secondary | ICD-10-CM | POA: Diagnosis present

## 2015-10-15 DIAGNOSIS — Z8249 Family history of ischemic heart disease and other diseases of the circulatory system: Secondary | ICD-10-CM | POA: Diagnosis not present

## 2015-10-15 LAB — MAGNESIUM: Magnesium: 1.8 mg/dL (ref 1.7–2.4)

## 2015-10-15 LAB — BASIC METABOLIC PANEL
Anion gap: 10 (ref 5–15)
BUN: 16 mg/dL (ref 6–20)
CALCIUM: 8.7 mg/dL — AB (ref 8.9–10.3)
CHLORIDE: 97 mmol/L — AB (ref 101–111)
CO2: 28 mmol/L (ref 22–32)
CREATININE: 1.07 mg/dL — AB (ref 0.44–1.00)
GFR calc non Af Amer: 48 mL/min — ABNORMAL LOW (ref >60)
GFR, EST AFRICAN AMERICAN: 56 mL/min — AB (ref >60)
Glucose, Bld: 108 mg/dL — ABNORMAL HIGH (ref 65–99)
Potassium: 3.9 mmol/L (ref 3.5–5.1)
Sodium: 135 mmol/L (ref 135–145)

## 2015-10-15 LAB — CBC
HCT: 32.3 % — ABNORMAL LOW (ref 36.0–46.0)
Hemoglobin: 11 g/dL — ABNORMAL LOW (ref 12.0–15.0)
MCH: 29.3 pg (ref 26.0–34.0)
MCHC: 34.1 g/dL (ref 30.0–36.0)
MCV: 85.9 fL (ref 78.0–100.0)
PLATELETS: 135 10*3/uL — AB (ref 150–400)
RBC: 3.76 MIL/uL — AB (ref 3.87–5.11)
RDW: 14.1 % (ref 11.5–15.5)
WBC: 3.3 10*3/uL — ABNORMAL LOW (ref 4.0–10.5)

## 2015-10-15 LAB — NA AND K (SODIUM & POTASSIUM), RAND UR
POTASSIUM UR: 13 mmol/L
SODIUM UR: 72 mmol/L

## 2015-10-15 LAB — LACTIC ACID, PLASMA: Lactic Acid, Venous: 0.8 mmol/L (ref 0.5–2.0)

## 2015-10-15 LAB — TSH: TSH: 3.109 u[IU]/mL (ref 0.350–4.500)

## 2015-10-15 LAB — OSMOLALITY, URINE: OSMOLALITY UR: 229 mosm/kg — AB (ref 300–900)

## 2015-10-15 MED ORDER — SODIUM CHLORIDE 0.9 % IV SOLN: 250.0000 mL | INTRAVENOUS | Status: DC | PRN

## 2015-10-15 MED ORDER — CHLORTHALIDONE 25 MG PO TABS
25.0000 mg | ORAL_TABLET | Freq: Every day | ORAL | Status: DC
  Administered 2015-10-15 – 2015-10-17 (×3): 25 mg via ORAL
  Filled 2015-10-15 (×7): qty 1

## 2015-10-15 MED ORDER — SODIUM CHLORIDE 0.9% FLUSH
3.0000 mL | Freq: Two times a day (BID) | INTRAVENOUS | Status: DC
  Administered 2015-10-15 – 2015-10-16 (×3): 3 mL via INTRAVENOUS

## 2015-10-15 MED ORDER — ALUM & MAG HYDROXIDE-SIMETH 200-200-20 MG/5ML PO SUSP: 30.0000 mL | Freq: Four times a day (QID) | ORAL | Status: DC | PRN

## 2015-10-15 MED ORDER — ATENOLOL 25 MG PO TABS
50.0000 mg | ORAL_TABLET | Freq: Every day | ORAL | Status: DC
  Administered 2015-10-15 – 2015-10-17 (×3): 50 mg via ORAL
  Filled 2015-10-15 (×3): qty 2

## 2015-10-15 MED ORDER — HYDRALAZINE HCL 25 MG PO TABS
50.0000 mg | ORAL_TABLET | Freq: Two times a day (BID) | ORAL | Status: DC
  Administered 2015-10-15 – 2015-10-17 (×5): 50 mg via ORAL
  Filled 2015-10-15: qty 2
  Filled 2015-10-15: qty 1
  Filled 2015-10-15 (×4): qty 2

## 2015-10-15 MED ORDER — LEVOTHYROXINE SODIUM 75 MCG PO TABS
75.0000 ug | ORAL_TABLET | Freq: Every day | ORAL | Status: DC
  Administered 2015-10-15 – 2015-10-17 (×3): 75 ug via ORAL
  Filled 2015-10-15: qty 1
  Filled 2015-10-15: qty 2
  Filled 2015-10-15: qty 1

## 2015-10-15 MED ORDER — SODIUM CHLORIDE 0.9% FLUSH: 3.0000 mL | INTRAVENOUS | Status: DC | PRN

## 2015-10-15 MED ORDER — ENOXAPARIN SODIUM 30 MG/0.3ML ~~LOC~~ SOLN
30.0000 mg | SUBCUTANEOUS | Status: DC
  Administered 2015-10-15: 30 mg via SUBCUTANEOUS
  Filled 2015-10-15: qty 0.3

## 2015-10-15 MED ORDER — OMEGA-3-ACID ETHYL ESTERS 1 G PO CAPS
1.0000 g | ORAL_CAPSULE | Freq: Every day | ORAL | Status: DC
  Administered 2015-10-15 – 2015-10-17 (×3): 1 g via ORAL
  Filled 2015-10-15 (×4): qty 1

## 2015-10-15 MED ORDER — ACETAMINOPHEN 325 MG PO TABS: 650.0000 mg | ORAL_TABLET | Freq: Four times a day (QID) | ORAL | Status: DC | PRN

## 2015-10-15 MED ORDER — LISINOPRIL 10 MG PO TABS
20.0000 mg | ORAL_TABLET | Freq: Two times a day (BID) | ORAL | Status: DC
  Administered 2015-10-15 – 2015-10-16 (×3): 20 mg via ORAL
  Filled 2015-10-15 (×3): qty 2

## 2015-10-15 MED ORDER — ONDANSETRON HCL 4 MG PO TABS: 4.0000 mg | ORAL_TABLET | Freq: Four times a day (QID) | ORAL | Status: DC | PRN

## 2015-10-15 MED ORDER — CALCIUM CARBONATE-VITAMIN D 500-200 MG-UNIT PO TABS
1.0000 | ORAL_TABLET | Freq: Every day | ORAL | Status: DC
  Administered 2015-10-15 – 2015-10-17 (×3): 1 via ORAL
  Filled 2015-10-15 (×3): qty 1
  Filled 2015-10-15: qty 2

## 2015-10-15 MED ORDER — ENOXAPARIN SODIUM 40 MG/0.4ML ~~LOC~~ SOLN
40.0000 mg | SUBCUTANEOUS | Status: DC
  Administered 2015-10-16 – 2015-10-17 (×2): 40 mg via SUBCUTANEOUS
  Filled 2015-10-15 (×2): qty 0.4

## 2015-10-15 MED ORDER — FERROUS SULFATE 325 (65 FE) MG PO TABS
325.0000 mg | ORAL_TABLET | Freq: Two times a day (BID) | ORAL | Status: DC
  Administered 2015-10-15 – 2015-10-17 (×4): 325 mg via ORAL
  Filled 2015-10-15 (×6): qty 1

## 2015-10-15 MED ORDER — ASPIRIN EC 81 MG PO TBEC
81.0000 mg | DELAYED_RELEASE_TABLET | ORAL | Status: DC
  Administered 2015-10-15 – 2015-10-17 (×2): 81 mg via ORAL
  Filled 2015-10-15 (×3): qty 1

## 2015-10-15 MED ORDER — FLECAINIDE ACETATE 50 MG PO TABS
50.0000 mg | ORAL_TABLET | Freq: Two times a day (BID) | ORAL | Status: DC
  Administered 2015-10-15 – 2015-10-17 (×5): 50 mg via ORAL
  Filled 2015-10-15 (×11): qty 1

## 2015-10-15 MED ORDER — ACETAMINOPHEN 650 MG RE SUPP: 650.0000 mg | Freq: Four times a day (QID) | RECTAL | Status: DC | PRN

## 2015-10-15 MED ORDER — SODIUM CHLORIDE 0.9 % IV SOLN: INTRAVENOUS | Status: AC

## 2015-10-15 MED ORDER — OXYCODONE HCL 5 MG PO TABS: 5.0000 mg | ORAL_TABLET | ORAL | Status: DC | PRN

## 2015-10-15 MED ORDER — ONDANSETRON HCL 4 MG/2ML IJ SOLN: 4.0000 mg | Freq: Four times a day (QID) | INTRAMUSCULAR | Status: DC | PRN

## 2015-10-15 MED ORDER — GADOBENATE DIMEGLUMINE 529 MG/ML IV SOLN
6.0000 mL | Freq: Once | INTRAVENOUS | Status: AC | PRN
  Administered 2015-10-15: 6 mL via INTRAVENOUS

## 2015-10-15 MED ORDER — HYDRALAZINE HCL 20 MG/ML IJ SOLN
10.0000 mg | INTRAMUSCULAR | Status: DC | PRN
  Administered 2015-10-16 – 2015-10-17 (×2): 10 mg via INTRAVENOUS
  Filled 2015-10-15 (×2): qty 1

## 2015-10-15 MED ORDER — ATORVASTATIN CALCIUM 10 MG PO TABS
10.0000 mg | ORAL_TABLET | Freq: Every day | ORAL | Status: DC
  Administered 2015-10-15 – 2015-10-17 (×3): 10 mg via ORAL
  Filled 2015-10-15 (×4): qty 1

## 2015-10-15 MED ORDER — HYDROMORPHONE HCL 1 MG/ML IJ SOLN: 0.5000 mg | INTRAMUSCULAR | Status: DC | PRN

## 2015-10-15 MED ORDER — POTASSIUM CHLORIDE CRYS ER 20 MEQ PO TBCR
40.0000 meq | EXTENDED_RELEASE_TABLET | Freq: Once | ORAL | Status: AC
  Administered 2015-10-15: 40 meq via ORAL
  Filled 2015-10-15: qty 2

## 2015-10-15 NOTE — Progress Notes (Addendum)
82 y.o. female with a history of Paroxysmal Atrial Fibrillation, HTN, Hyperlipidemia, Stage III CKD who presents to the ED with complaints of Increased blood pressures for the past 10 days. Periods for the last 1 week the patient's blood pressure has been close to 200. Patient has been seen previously by Dr. Caryl Comes for left atrial tachycardias that resolved with flecainide. Patient has also been seen by nephrology Dr. Geri Seminole nephrologist who wants to continue medical therapy (> 60% stenosis in the left renal artery). Patient had a renal arterial ultrasound on 09/29/14 that showed,>60% ostial right renal artery stenosis.Normal left renal artery.  Plan  Hypertension-continue atenolol, chlorthalidone, hydralazine, lisinopril, MRA abdomen to evaluate renal artery stenosis Nephrology consultation for further recommendations  History of Left atrial tachycardia - no evidence of atrial fibrillation. Resolved on Flecainide. We will continue, in SR today, no palpitations since January 2015.  Recent olecranon BURSITIS OF THE LEFT ELBOW-RESOLVED  Chronic kidney disease stage III-stable

## 2015-10-15 NOTE — ED Notes (Signed)
Admitting MD at bedside.

## 2015-10-15 NOTE — H&P (Signed)
[facility] Admission History and Physical       [name] [id number] DOB: Aug 09, 1934 DOA: 10/14/2015  Referring physician: EDP PCP: Redge Gainer, MD  Specialists:   Chief Complaint: Elevated Blood Pressure  HPI: [name] is a 82 y.o. female with a history of Paroxysmal Atrial Fibrillation,  HTN, Hyperlipidemia, Stage III CKD who presents to the ED with complaints of Increased blood pressures for the past 10 days.  She reports havign headaches off and on.   She saw her Nephrologist   4 days ago for a follow-up visit, and she was advised to take Clonidine as needed for blood pressures above 180.  She reports having blood pressure in the 200's this afternoon, and she had taken a total of 3 Clonidine tablets without improvement and so she came to the ED.   IN the ED she was administered IV Hydralazine, and also she was encouraged to take the rest of her medications her Lisinopril and her Hydralazine.   Her labs revealed mild Hyponatremia, Mild Hypokalemia, and pancytopenia, and she was referred for admission.     Review of Systems:  Constitutional: No Weight Loss, No Weight Gain, Night Sweats, Fevers, Chills, Dizziness, Light Headedness, Fatigue, or Generalized Weakness HEENT:  +Headaches, Difficulty Swallowing,Tooth/Dental Problems,Sore Throat,  No Sneezing, Rhinitis, Ear Ache, Nasal Congestion, or Post Nasal Drip,  Cardio-vascular:  No Chest pain, Orthopnea, PND, Edema in Lower Extremities, Anasarca, Dizziness, Palpitations  Resp: No Dyspnea, No DOE, No Productive Cough, No Non-Productive Cough, No Hemoptysis, No Wheezing.    GI: No Heartburn, Indigestion, Abdominal Pain, Nausea, Vomiting, Diarrhea, Constipation, Hematemesis, Hematochezia, Melena, Change in Bowel Habits,  Loss of Appetite  GU: No Dysuria, No Change in Color of Urine, No Urgency or Urinary Frequency, No Flank pain.  Musculoskeletal: No Joint Pain or Swelling, No Decreased Range of Motion, No Back Pain.    Neurologic: No Syncope, No Seizures, Muscle Weakness, Paresthesia, Vision Disturbance or Loss, No Diplopia, No Vertigo, No Difficulty Walking,  Skin: No Rash or Lesions. Psych: No Change in Mood or Affect, No Depression or Anxiety, No Memory loss, No Confusion, or Hallucinations   Past Medical History  Diagnosis Date  . Nodule of right lung     Right upper lobe  . Hyperlipidemia   . Hypertension   . Hypothyroidism   . Renal insufficiency     Chronic  . Arthritis   . CKD (chronic kidney disease), stage III   . Anemia   . Renal vascular disease   . Asthma      Past Surgical History  Procedure Laterality Date  . Thyroidectomy, partial        Prior to Admission medications   Medication Sig Start Date End Date Taking? Authorizing Provider  aspirin EC 81 MG tablet Take 81 mg by mouth every other day.   Yes Historical Provider, MD  atenolol-chlorthalidone (TENORETIC) 50-25 MG tablet Take 1 tablet by mouth daily. 07/24/15  Yes Chipper Herb, MD  atorvastatin (LIPITOR) 20 MG tablet TAKE 1/2 TABLET DAILY 06/30/15  Yes Chipper Herb, MD  calcium-vitamin D (OSCAL WITH D) 500-200 MG-UNIT per tablet Take 1 tablet by mouth daily.     Yes Historical Provider, MD  cloNIDine (CATAPRES) 0.1 MG tablet Take 0.1 mg by mouth daily as needed. 1 tablet as needed for systolic blood pressure over 180 09/13/14  Yes Historical Provider, MD  ferrous sulfate ([employer] IRON) 325 (65 FE) MG tablet Take 325 mg by mouth 2 (two) times  daily with a meal.   Yes Historical Provider, MD  flecainide (TAMBOCOR) 50 MG tablet TAKE 1 TABLET (50 MG TOTAL) BY MOUTH 2 (TWO) TIMES DAILY. 02/05/15  Yes Deboraha Sprang, MD  hydrALAZINE (APRESOLINE) 50 MG tablet Take 50 mg by mouth 2 (two) times daily.    Yes Historical Provider, MD  hydroxypropyl methylcellulose / hypromellose (ISOPTO TEARS / GONIOVISC) 2.5 % ophthalmic solution Place 1 drop into both eyes as needed for dry eyes.   Yes Historical Provider, MD  levothyroxine  (SYNTHROID, LEVOTHROID) 75 MCG tablet TAKE 1 TABLET (75 MCG TOTAL) BY MOUTH DAILY. 05/28/15  Yes Chipper Herb, MD  lisinopril (PRINIVIL,ZESTRIL) 20 MG tablet Take 1 tablet by mouth 2 (two) times daily.  05/26/14  Yes Historical Provider, MD  Omega-3 Fatty Acids (FISH OIL) 1200 MG CAPS Take 1,200 mg by mouth daily.   Yes Historical Provider, MD     Allergies  Allergen Reactions  . Clonidine Derivatives Nausea Only and Hypertension  . Isosorbide Nausea Only  . Sulfonamide Derivatives Nausea Only and Rash  . Terazosin Hives and Nausea Only    Social History:  reports that she has never smoked. She does not have any smokeless tobacco history on file. She reports that she does not drink alcohol or use illicit drugs.    Family History  Problem Relation Age of Onset  . Thyroid disease Mother   . Stroke Father   . Hypertension Brother        Physical Exam:  GEN:  Pleasant Elderly Thin  82 y.o. Caucasian female examined and in no acute distress; cooperative with exam Filed Vitals:   10/14/15 2330 10/15/15 0000 10/15/15 0015 10/15/15 0030  BP: 187/66 198/92  174/63  Pulse: 56 66 62 52  Temp:      TempSrc:      Resp: 14 19 19 11   Height:      Weight:      SpO2: 96% 97% 98% 94%   Blood pressure 174/63, pulse 52, temperature 97.6 F (36.4 C), temperature source Oral, resp. rate 11, height 5\' 6"  (1.676 m), weight 56.246 kg (124 lb), SpO2 94 %. PSYCH: She is alert and oriented x4; does not appear anxious does not appear depressed; affect is normal HEENT: Normocephalic and Atraumatic, Mucous membranes pink; PERRLA; EOM intact; Fundi:  Benign;  No scleral icterus, Nares: Patent, Oropharynx: Clear, Fair Dentition,    Neck:  FROM, No Cervical Lymphadenopathy nor Thyromegaly or Carotid Bruit; No JVD; Breasts:: Not examined CHEST WALL: No tenderness CHEST: Normal respiration, clear to auscultation bilaterally HEART: Regular rate and rhythm; no murmurs rubs or gallops BACK: No kyphosis or  scoliosis; No CVA tenderness ABDOMEN: Positive Bowel Sounds, Scaphoid, Soft Non-Tender, No Rebound or Guarding; No Masses, No Organomegaly. Rectal Exam: Not done EXTREMITIES: No  Cyanosis, Clubbing, or Edema; No Ulcerations. Genitalia: not examined PULSES: 2+ and symmetric SKIN: Normal hydration no rash or ulceration CNS:  Alert and Oriented x 4, No Focal Deficits Vascular: pulses palpable throughout    Labs on Admission:  Basic Metabolic Panel:  Recent Labs Lab 10/14/15 2105  NA 129*  K 3.3*  CL 92*  CO2 27  GLUCOSE 124*  BUN 20  CREATININE 1.13*  CALCIUM 8.6*   Liver Function Tests:  Recent Labs Lab 10/14/15 2105  AST 33  ALT 32  ALKPHOS 61  BILITOT 0.6  PROT 7.1  ALBUMIN 4.1   No results for input(s): LIPASE, AMYLASE in the last 168 hours. No results  for input(s): AMMONIA in the last 168 hours. CBC:  Recent Labs Lab 10/14/15 2105  WBC 3.8*  NEUTROABS 2.5  HGB 11.4*  HCT 34.0*  MCV 85.6  PLT 128*   Cardiac Enzymes:  Recent Labs Lab 10/14/15 2105  TROPONINI <0.03    BNP (last 3 results)  Recent Labs  10/14/15 2105  BNP 275.0*    ProBNP (last 3 results) No results for input(s): PROBNP in the last 8760 hours.  CBG: No results for input(s): GLUCAP in the last 168 hours.  Radiological Exams on Admission: Dg Chest 2 View  10/14/2015  CLINICAL DATA:  Acute onset of generalized weakness and high blood pressure. Initial encounter. EXAM: CHEST  2 VIEW COMPARISON:  Chest radiograph performed 02/06/2014 FINDINGS: The lungs are well-aerated and clear. There is no evidence of focal opacification, pleural effusion or pneumothorax. The heart is mildly enlarged. No acute osseous abnormalities are seen. IMPRESSION: Mild cardiomegaly.  Lungs remain grossly clear. Electronically Signed   By: Garald Balding M.D.   On: 10/14/2015 22:04   Ct Head Wo Contrast  10/14/2015  CLINICAL DATA:  Acute onset of headache.  Initial encounter. EXAM: CT HEAD WITHOUT  CONTRAST TECHNIQUE: Contiguous axial images were obtained from the base of the skull through the vertex without intravenous contrast. COMPARISON:  CT of the head performed 04/02/2014, and MRI of the brain performed 04/03/2014 FINDINGS: There is no evidence of acute infarction, mass lesion, or intra- or extra-axial hemorrhage on CT. Scattered periventricular and subcortical white matter change likely reflects small vessel ischemic microangiopathy. The posterior fossa, including the cerebellum, brainstem and fourth ventricle, is within normal limits. The third and lateral ventricles, and basal ganglia are unremarkable in appearance. The cerebral hemispheres are symmetric in appearance, with normal gray-white differentiation. No mass effect or midline shift is seen. There is no evidence of fracture; visualized osseous structures are unremarkable in appearance. The orbits are within normal limits. The paranasal sinuses and mastoid air cells are well-aerated. No significant soft tissue abnormalities are seen. IMPRESSION: 1. No acute intracranial pathology seen on CT. 2. Mild small vessel ischemic microangiopathy. Electronically Signed   By: Garald Balding M.D.   On: 10/14/2015 22:39     EKG: Independently reviewed.    Assessment/Plan:   82 y.o. female with  Principal Problem:    1.    Hypertensive urgency    IV Hydralazine PRN SBP > 170    Continue Atenolol.HCTZ, Hydralazine PO, and Lisinopril    Discontinue PRN Clonidine PRN     since it appears to have a paradoxical effect,      and makes her Nauseous      Active Problems:    2.    Hypokalemia    Replace K+    Check Magnesium level      3.    Hyponatremia- due to HCTZ Rx most likely    Check Urine Osm, and Urine Electrolytes    Gentle IVFs     Hold HCTZ Rx      4.    Pancytopenia ([place])- Question if medication induced    Monitor Trend.                5.    Paroxysmal atrial fibrillation (HCC)    Continue Fleicainide      6.    CKD  (chronic kidney disease) stage 3, GFR 30-59 ml/min    Monitor BUN/Cr      7.    Chronic diastolic heart failure ([place])  Monitor I/Os    Continue Atenolol/HCTZ Rx      8.    Hypothyroidism    Continue Levothyroxine Rx      9.    DVT Prophylaxis    Lovenox Monitor PLTs       Code Status:     FULL CODE        Family Communication:   No Family Present    Disposition Plan:    Inpatient  Status        Time spent: 64 Minutes      [facility] Pager [phone]   If [place] Please Contact the Day Rounding Team MD for [facility]  If 7PM-7AM, Please Contact Night-Floor Coverage  [email or web address] Password [email or web address] 10/15/2015, 12:49 AM     ADDENDUM:   Patient was seen and examined on 10/15/2015

## 2015-10-16 LAB — COMPREHENSIVE METABOLIC PANEL
ALT: 23 U/L (ref 14–54)
ANION GAP: 4 — AB (ref 5–15)
AST: 22 U/L (ref 15–41)
Albumin: 3.5 g/dL (ref 3.5–5.0)
Alkaline Phosphatase: 53 U/L (ref 38–126)
BUN: 15 mg/dL (ref 6–20)
CALCIUM: 8.6 mg/dL — AB (ref 8.9–10.3)
CHLORIDE: 103 mmol/L (ref 101–111)
CO2: 31 mmol/L (ref 22–32)
Creatinine, Ser: 1.13 mg/dL — ABNORMAL HIGH (ref 0.44–1.00)
GFR, EST AFRICAN AMERICAN: 52 mL/min — AB (ref >60)
GFR, EST NON AFRICAN AMERICAN: 45 mL/min — AB (ref >60)
Glucose, Bld: 100 mg/dL — ABNORMAL HIGH (ref 65–99)
Potassium: 3.6 mmol/L (ref 3.5–5.1)
SODIUM: 138 mmol/L (ref 135–145)
Total Bilirubin: 0.6 mg/dL (ref 0.3–1.2)
Total Protein: 6.1 g/dL — ABNORMAL LOW (ref 6.5–8.1)

## 2015-10-16 LAB — CBC
HCT: 32.5 % — ABNORMAL LOW (ref 36.0–46.0)
Hemoglobin: 10.9 g/dL — ABNORMAL LOW (ref 12.0–15.0)
MCH: 29.1 pg (ref 26.0–34.0)
MCHC: 33.5 g/dL (ref 30.0–36.0)
MCV: 86.7 fL (ref 78.0–100.0)
PLATELETS: 122 10*3/uL — AB (ref 150–400)
RBC: 3.75 MIL/uL — ABNORMAL LOW (ref 3.87–5.11)
RDW: 14.2 % (ref 11.5–15.5)
WBC: 3 10*3/uL — ABNORMAL LOW (ref 4.0–10.5)

## 2015-10-16 LAB — URINE CULTURE: Culture: 2000

## 2015-10-16 MED ORDER — LISINOPRIL 10 MG PO TABS
40.0000 mg | ORAL_TABLET | Freq: Two times a day (BID) | ORAL | Status: DC
  Administered 2015-10-16 – 2015-10-17 (×2): 40 mg via ORAL
  Filled 2015-10-16 (×2): qty 4

## 2015-10-16 MED ORDER — FLECAINIDE ACETATE 100 MG PO TABS
ORAL_TABLET | ORAL | Status: AC
  Filled 2015-10-16: qty 1

## 2015-10-16 NOTE — Progress Notes (Signed)
[facility] PROGRESS NOTE  [name] [id number] DOB: [birth year] DOA: 10/14/2015 PCP: Redge Gainer, MD  Summary  82 year old woman with PMH of paroxysmal Atrial Fibrillation, HTN, Hyperlipidemia, CKD Stage III presented with complaints of high blood pressure and associated headaches. On admission she was given IV Hydralazine with improvement in BP. MRA abdomen revealed ostial right renal artery stenosis, which is chronic. Case was discussed with her primary nephrologist who recommended to increase lisinopril. He will arrange for outpatient evaluation for possible renal artery stent. If blood pressures remain stable and she does not have any further symptoms, anticipate discharge in the next 24 hours.    Assessment/Plan: 1. Hypertensive urgency, Patient has been continued on her home medication regimen with the exception of clonidine. She has been receiving IV hydralazine PRN and blood pressures are improving. Case discussed with Dr. Mercy Moore, and recommendations were to increase lisinopril to 40mg  bid. He will follow up patient for further management of blood pressure 2. Ostial right renal artery stenosis, revealed on MRA abdomen. Discussed with Dr. Mercy Moore and he will refer patient for outpatient angiogram with renal artery stent placement..  3. Hyponatremia, likely due to Chlorthalidone. Improved with IV fluids.  4. Pancytopenia. Likely medication-induced.  5. Paroxysmal atrial fibrillation. Currently in sinus rhythm. Continue Flecainide. Followed by Dr. Meda Coffee 6. CKD Stage III.  Creatinine is currently at baseline. Will continue to monitor.  7. Chronic diastolic heart failure. Compensated. Monitor I&Os  8. Hypothyroidism, continue synthroid.   Code Status: Full DVT prophylaxis: Lovenox Family Communication: Husband bedside.  Disposition Plan: Anticipate discharge within 24 hours.     Consultants:  None  Procedures:  None  Antibiotics:  None  HPI/Subjective: Feeling better since admission. Has an outpatient nephrologist that hs also been helping manage blood pressure. Has been taking Clonidine but reports this causes constipation.     Objective: Filed Vitals:   10/16/15 0555 10/16/15 1217  BP: 170/54 141/48  Pulse: 59 53  Temp: 98 F (36.7 C) 98 F (36.7 C)  Resp: 20 20    Intake/Output Summary (Last 24 hours) at 10/16/15 1243 Last data filed at 10/16/15 0800  Gross per 24 hour  Intake    480 ml  Output   1150 ml  Net   -670 ml   Filed Weights   10/14/15 2004  Weight: 56.246 kg (124 lb)    Exam: General: NAD, looks comfortable Cardiovascular: RRR, S1, S2  Respiratory: clear bilaterally, No wheezing, rales or rhonchi Abdomen: soft, non tender, no distention , bowel sounds normal Musculoskeletal: No edema b/l   Data Reviewed: Basic Metabolic Panel:  Recent Labs Lab 10/14/15 2105 10/15/15 0510 10/16/15 0616  NA 129* 135 138  K 3.3* 3.9 3.6  CL 92* 97* 103  CO2 27 28 31   GLUCOSE 124* 108* 100*  BUN 20 16 15   CREATININE 1.13* 1.07* 1.13*  CALCIUM 8.6* 8.7* 8.6*  MG 1.8  --   --    Liver Function Tests:  Recent Labs Lab 10/14/15 2105 10/16/15 0616  AST 33 22  ALT 32 23  ALKPHOS 61 53  BILITOT 0.6 0.6  PROT 7.1 6.1*  ALBUMIN 4.1 3.5     Recent Labs Lab 10/14/15 2105 10/15/15 0510 10/16/15 0616  WBC 3.8* 3.3* 3.0*  NEUTROABS 2.5  --   --   HGB 11.4* 11.0* 10.9*  HCT 34.0* 32.3* 32.5*  MCV 85.6 85.9 86.7  PLT 128* 135* 122*   Cardiac Enzymes:  Recent Labs Lab 10/14/15  2105  TROPONINI <0.03   BNP (last 3 results)  Recent Labs  10/14/15 2105  BNP 275.0*     Recent Results (from the past 240 hour(s))  Urine culture     Status: None   Collection Time: 10/14/15  9:38 PM  Result Value Ref Range Status   Specimen Description URINE, CLEAN CATCH  Final   Special Requests NONE  Final   Culture    Final    2,000 COLONIES/mL INSIGNIFICANT GROWTH Performed at [facility]    Report Status 10/16/2015 FINAL  Final     Studies: Dg Chest 2 View  10/14/2015  CLINICAL DATA:  Acute onset of generalized weakness and high blood pressure. Initial encounter. EXAM: CHEST  2 VIEW COMPARISON:  Chest radiograph performed 02/06/2014 FINDINGS: The lungs are well-aerated and clear. There is no evidence of focal opacification, pleural effusion or pneumothorax. The heart is mildly enlarged. No acute osseous abnormalities are seen. IMPRESSION: Mild cardiomegaly.  Lungs remain grossly clear. Electronically Signed   By: Garald Balding M.D.   On: 10/14/2015 22:04   Ct Head Wo Contrast  10/14/2015  CLINICAL DATA:  Acute onset of headache.  Initial encounter. EXAM: CT HEAD WITHOUT CONTRAST TECHNIQUE: Contiguous axial images were obtained from the base of the skull through the vertex without intravenous contrast. COMPARISON:  CT of the head performed 04/02/2014, and MRI of the brain performed 04/03/2014 FINDINGS: There is no evidence of acute infarction, mass lesion, or intra- or extra-axial hemorrhage on CT. Scattered periventricular and subcortical white matter change likely reflects small vessel ischemic microangiopathy. The posterior fossa, including the cerebellum, brainstem and fourth ventricle, is within normal limits. The third and lateral ventricles, and basal ganglia are unremarkable in appearance. The cerebral hemispheres are symmetric in appearance, with normal gray-white differentiation. No mass effect or midline shift is seen. There is no evidence of fracture; visualized osseous structures are unremarkable in appearance. The orbits are within normal limits. The paranasal sinuses and mastoid air cells are well-aerated. No significant soft tissue abnormalities are seen. IMPRESSION: 1. No acute intracranial pathology seen on CT. 2. Mild small vessel ischemic microangiopathy. Electronically Signed   By:  Garald Balding M.D.   On: 10/14/2015 22:39   Mr Jodene Nam Abdomen W Wo Contrast  10/15/2015  CLINICAL DATA:  Increase in bp, weakness, EXAM: MRA ABDOMEN AND PELVIS WITH CONTRAST TECHNIQUE: Multiplanar, multiecho pulse sequences of the abdomen and pelvis were obtained with intravenous contrast. Angiographic images of abdomen and pelvis were obtained using MRA technique with intravenous contrast. CONTRAST:  32mL MULTIHANCE GADOBENATE DIMEGLUMINE 529 MG/ML IV SOLN discussed gfr with Dr.Stahl, gfr=48 today, reduced dose, COMPARISON:  CT 11/09/2014 and previous FINDINGS: Arterial findings: Aorta: Mild tortuosity. Mild atheromatous irregularity. No dissection, aneurysm, or stenosis. Celiac axis:         Patent Superior mesenteric: Patent Left renal:          Single, patent Right renal: Single, with short-segment ostial stenosis of at least moderate severity, patent distally Inferior mesenteric: Diminutive, patent Left iliac:          Tortuous, without aneurysm or stenosis. Right iliac:         Tortuous, without aneurysm or stenosis. Venous findings: Patent hepatic veins, portal vein, SMV, IMV, splenic vein, renal veins, IVC. Review of the MIP images confirms the above findings. Nonvascular findings: No pleural effusion. Unremarkable limited evaluation of liver, spleen, adrenal glands, pancreas, left kidney. There is a 17 mm fluid signal lesion in the interpolar  region of the right kidney, otherwise unremarkable. No hydronephrosis. Stomach and visualized portions of small bowel and colon are nondilated. Metallic Susceptibility artifact in the posterior fundus and gastric body lumen may be related to ingested material. No ascites. IMPRESSION: 1. Ostial right renal artery stenosis, of possible hemodynamic significance. If there is clinical concern of renovascular hypertension, Consider catheter angiography for further evaluation, as the lesion may be amenable to percutaneous treatment if confirmed to be significant.  Electronically Signed   By: Lucrezia Europe M.D.   On: 10/15/2015 12:00    Scheduled Meds: . aspirin EC  81 mg Oral QODAY  . atenolol  50 mg Oral Daily  . atorvastatin  10 mg Oral Daily  . calcium-vitamin D  1 tablet Oral Daily  . chlorthalidone  25 mg Oral Daily  . enoxaparin (LOVENOX) injection  40 mg Subcutaneous Q24H  . ferrous sulfate  325 mg Oral BID WC  . flecainide  50 mg Oral Q12H  . hydrALAZINE  50 mg Oral BID  . levothyroxine  75 mcg Oral QAC breakfast  . lisinopril  20 mg Oral BID  . omega-3 acid ethyl esters  1 g Oral Daily  . sodium chloride flush  3 mL Intravenous Q12H  . sodium chloride flush  3 mL Intravenous Q12H   Continuous Infusions: . sodium chloride 75 mL/hr at 10/15/15 1325    Principal Problem:   Hyponatremia Active Problems:   Hypokalemia   Paroxysmal atrial fibrillation (HCC)   Hypertensive urgency   CKD (chronic kidney disease) stage 3, GFR 30-59 ml/min   Chronic diastolic heart failure (HCC)   Hypothyroidism   Pancytopenia ([place])    Time spent: 25 minutes     Kathie Dike, MD   [facility] Pager [phone]. If 7PM-7AM, please contact night-coverage at [email or web address], password [email or web address] 10/16/2015, 12:43 PM  LOS: 1 day      By signing my name below, I, Rennis Harding, attest that this documentation has been prepared under the direction and in the presence of Kathie Dike, MD. Electronically signed: Rennis Harding, Scribe. 10/16/2015 2:30 pm  I, Dr. Kathie Dike, personally performed the services described in this documentaiton. All medical record entries made by the scribe were at my direction and in my presence. I have reviewed the chart and agree that the record reflects my personal performance and is accurate and complete  Kathie Dike, MD, 10/16/2015 4:57 PM

## 2015-10-16 NOTE — Care Management Note (Signed)
Case Management Note  Patient Details  Name: [name] MRN: [id number] Date of Birth: 27-Oct-1933  Subjective/Objective:     Spoke with patient for discharge planning. Patient is alert and oriented form home and independent with spouse at home. No DME. Drives self to appointments. No medication needs. No CM needs identified.               Action/Plan:  Home with self care. Expected Discharge Date:                  Expected Discharge Plan:  Home/Self Care  In-House Referral:     Discharge [place] not met per provider  Post Acute Care Choice:    Choice offered to:     DME Arranged:    DME Agency:     HH Arranged:    HH Agency:     Status of Service:  Completed, signed off  Medicare Important Message Given:    Date Medicare IM Given:    Medicare IM give by:    Date Additional Medicare IM Given:    Additional Medicare Important Message give by:     If discussed at [place] of Stay Meetings, dates discussed:    Additional Comments:  Alvie Heidelberg, RN 10/16/2015, 5:00 PM

## 2015-10-17 ENCOUNTER — Encounter (HOSPITAL_COMMUNITY): Payer: Self-pay | Admitting: Internal Medicine

## 2015-10-17 DIAGNOSIS — I10 Essential (primary) hypertension: Secondary | ICD-10-CM | POA: Insufficient documentation

## 2015-10-17 DIAGNOSIS — I701 Atherosclerosis of renal artery: Secondary | ICD-10-CM

## 2015-10-17 LAB — CBC
HEMATOCRIT: 35.1 % — AB (ref 36.0–46.0)
HEMOGLOBIN: 11.8 g/dL — AB (ref 12.0–15.0)
MCH: 29 pg (ref 26.0–34.0)
MCHC: 33.6 g/dL (ref 30.0–36.0)
MCV: 86.2 fL (ref 78.0–100.0)
Platelets: 134 10*3/uL — ABNORMAL LOW (ref 150–400)
RBC: 4.07 MIL/uL (ref 3.87–5.11)
RDW: 14.2 % (ref 11.5–15.5)
WBC: 3.8 10*3/uL — ABNORMAL LOW (ref 4.0–10.5)

## 2015-10-17 LAB — BASIC METABOLIC PANEL
ANION GAP: 8 (ref 5–15)
BUN: 12 mg/dL (ref 6–20)
CO2: 30 mmol/L (ref 22–32)
Calcium: 9 mg/dL (ref 8.9–10.3)
Chloride: 98 mmol/L — ABNORMAL LOW (ref 101–111)
Creatinine, Ser: 1.05 mg/dL — ABNORMAL HIGH (ref 0.44–1.00)
GFR calc Af Amer: 57 mL/min — ABNORMAL LOW (ref >60)
GFR calc non Af Amer: 49 mL/min — ABNORMAL LOW (ref >60)
GLUCOSE: 114 mg/dL — AB (ref 65–99)
POTASSIUM: 3.5 mmol/L (ref 3.5–5.1)
Sodium: 136 mmol/L (ref 135–145)

## 2015-10-17 MED ORDER — LISINOPRIL 40 MG PO TABS: 40.0000 mg | ORAL_TABLET | Freq: Two times a day (BID) | ORAL | Status: DC

## 2015-10-17 NOTE — Progress Notes (Signed)
Patient discharged home.  IV removed - WNL.  Reviewed DC instructions and medication changes.  Follow up in place with PCP. Verbalizes understanding.  No questions at this time.  Stable to DC home.  Assisted off unit via WC by NT.

## 2015-10-17 NOTE — Discharge Summary (Signed)
Physician Discharge Summary  [name] [id number] DOB: 1933-11-11 DOA: 10/14/2015  PCP: Redge Gainer, MD  Admit date: 10/14/2015 Discharge date: 10/17/2015  Time spent: Greater than 30 minutes  Recommendations for Outpatient Follow-up:  1. Recommend rechecking the patient's renal function on a higher dose of lisinopril. 2. Recommend follow-up of the patient's CBCS she has mild pancytopenia.     Discharge Diagnoses:  1. Hypertensive urgency. 2. Chronic malignant hypertension. 3. Ostial right renal artery stenosis, per MRA of the abdomen. 4. Hyponatremia, possibly from mild volume depletion and/or chlorthalidone. Resolved with IV fluids. 5. Mild pancytopenia, possibly secondary Flecanide which can cause blood dyscrasias. 6. Stage III chronic kidney disease. 7. Chronic diastolic heart failure. Compensated. 8. Hypothyroidism. 9. Paroxysmal atrial fibrillation.  Discharge Condition: Improved.  Diet recommendation: Heart healthy.  Filed Weights   10/14/15 2004  Weight: 56.246 kg (124 lb)    History of present illness:  The patient is a 82 year old woman with a history of hypertension, chronic kidney disease, paroxysmal atrial fibrillation, and hyperlipidemia, who presented to the ED on 10/15/2015 with a chief complaint of intermittent headaches on and off and increasing blood pressures at home. She was evaluated by her nephrologist, Dr. Mercy Moore, who advised her to take clonidine as needed for blood pressures above [id number] systolically. Prior to admission, she reported having systolic blood pressures of greater than 200s. She took 3 clonidine tablets without improvement, so she presented to the ED. On arrival to the ED, her blood pressure was 218/74. Her lab data were significant for sodium of 129, potassium of 3.3, creatinine 1.13, glucose of 124, BNP is 275, W BC of 3.8, and platelet count of 128. She was admitted for further evaluation and management.  Hospital Course:   1.  Hypertensive urgency. The patient was restarted on her chronic medications including atenolol, chlorthalidone, lisinopril, and hydralazine. When necessary IV hydralazine was given. An abdominal MRI was ordered for evaluation of renal artery stenosis. The MRI revealed ostial right renal artery stenosis. Dr. Raylene Everts discussed the findings with her nephrologist, Dr. Mercy Moore. Dr. Mercy Moore stated that he will refer the patient for outpatient angiogram with renal artery stent placement-this will be arranged by him. He also recommended that the lisinopril be doubled from 20 mg twice a day to 40 mg twice a day. The patient's blood pressure improved. She was asymptomatic at the time of discharge. Due to the increase in lisinopril, would recommend a close follow-up of her renal renal function. 2. Right renal artery stenosis. As above in #1. 3. Hyponatremia. The patient's serum sodium was 129 on admission. She was started on gentle normal saline. Her serum sodium improved. Etiology may have been mild volume depletion or chlorthalidone. It was continued upon discharge, but will defer discontinuing it to Dr. Mercy Moore. 4. Pancytopenia. The patient had mild anemia, thrombocytopenia, and leukopenia. None of her blood counts were in the range that were concerning or needed repletion or transfusions. She was apparently told that her white blood cell count had been low in the past. The etiology may be from Flecanide. Would recommend follow-up of her CBC. 5. Paroxysmal atrial fibrillation. She was in normal sinus rhythm during the hospitalization. She was continued on Flecanide and ASA. She is followed by Dr. Meda Coffee. 6. Chronic diastolic heart failure. It remained compensated. 7. Stage III chronic kidney disease. Her creatinine was at baseline. 8. Hypothyroidism. She was maintained on Synthroid.  Procedures:  None  Consultations:  Phone call consult with nephrologist, Dr. Mercy Moore  Discharge Exam:  Filed Vitals:    10/17/15 0800 10/17/15 1312  BP: 190/73 149/64  Pulse: 64 61  Temp: 98.2 F (36.8 C) 97 F (36.1 C)  Resp: 18 18    General: Pleasant 82 year old woman in no acute distress. Cardiovascular: S1, S2, with a soft systolic murmur. Respiratory: Clear to auscultation bilaterally. Neurologic: She is alert and oriented 3. Cranial nerves II through XII are intact.  Discharge Instructions   Discharge Instructions    Diet - low sodium heart healthy    Complete by:  As directed      Increase activity slowly    Complete by:  As directed           Current Discharge Medication List    CONTINUE these medications which have CHANGED   Details  lisinopril (PRINIVIL,ZESTRIL) 40 MG tablet Take 1 tablet (40 mg total) by mouth 2 (two) times daily. Qty: 60 tablet, Refills: 3      CONTINUE these medications which have NOT CHANGED   Details  aspirin EC 81 MG tablet Take 81 mg by mouth every other day.    atenolol-chlorthalidone (TENORETIC) 50-25 MG tablet Take 1 tablet by mouth daily. Qty: 90 tablet, Refills: 3   Associated Diagnoses: Essential hypertension, benign    atorvastatin (LIPITOR) 20 MG tablet TAKE 1/2 TABLET DAILY Qty: 30 tablet, Refills: 5    calcium-vitamin D (OSCAL WITH D) 500-200 MG-UNIT per tablet Take 1 tablet by mouth daily.      cloNIDine (CATAPRES) 0.1 MG tablet Take 0.1 mg by mouth daily as needed. 1 tablet as needed for systolic blood pressure over 180 Refills: 6    ferrous sulfate ([employer] IRON) 325 (65 FE) MG tablet Take 325 mg by mouth 2 (two) times daily with a meal.    flecainide (TAMBOCOR) 50 MG tablet TAKE 1 TABLET (50 MG TOTAL) BY MOUTH 2 (TWO) TIMES DAILY. Qty: 180 tablet, Refills: 3    hydrALAZINE (APRESOLINE) 50 MG tablet Take 50 mg by mouth 2 (two) times daily.     hydroxypropyl methylcellulose / hypromellose (ISOPTO TEARS / GONIOVISC) 2.5 % ophthalmic solution Place 1 drop into both eyes as needed for dry eyes.    levothyroxine (SYNTHROID,  LEVOTHROID) 75 MCG tablet TAKE 1 TABLET (75 MCG TOTAL) BY MOUTH DAILY. Qty: 90 tablet, Refills: 1    Omega-3 Fatty Acids (FISH OIL) 1200 MG CAPS Take 1,200 mg by mouth daily.       Allergies  Allergen Reactions  . Clonidine Derivatives Nausea Only and Hypertension  . Isosorbide Nausea Only  . Sulfonamide Derivatives Nausea Only and Rash  . Terazosin Hives and Nausea Only   Follow-up Information    Follow up with Redge Gainer, MD In 2 weeks.   Specialty:  Family Medicine   Why:   YOU WILL NEED YOUR BLOOD COUNTS RECHECKED at    Contact information:   [place] [address] [phone]       Follow up with MATTINGLY,MICHAEL T, MD In 1 week.   Specialty:  Nephrology   Why:  CALL TO FOLLOW UP IF YOU HAVE NOT HEARD FROM HIS OFFICE IN  1 WEEK   Contact information:   [place] [address] [phone]        The results of significant diagnostics from this hospitalization (including imaging, microbiology, ancillary and laboratory) are listed below for reference.    Significant Diagnostic Studies: Dg Chest 2 View  10/14/2015  CLINICAL DATA:  Acute onset of generalized weakness  and high blood pressure. Initial encounter. EXAM: CHEST  2 VIEW COMPARISON:  Chest radiograph performed 02/06/2014 FINDINGS: The lungs are well-aerated and clear. There is no evidence of focal opacification, pleural effusion or pneumothorax. The heart is mildly enlarged. No acute osseous abnormalities are seen. IMPRESSION: Mild cardiomegaly.  Lungs remain grossly clear. Electronically Signed   By: Garald Balding M.D.   On: 10/14/2015 22:04   Ct Head Wo Contrast  10/14/2015  CLINICAL DATA:  Acute onset of headache.  Initial encounter. EXAM: CT HEAD WITHOUT CONTRAST TECHNIQUE: Contiguous axial images were obtained from the base of the skull through the vertex without intravenous contrast. COMPARISON:  CT of the head performed 04/02/2014, and MRI of the brain performed 04/03/2014  FINDINGS: There is no evidence of acute infarction, mass lesion, or intra- or extra-axial hemorrhage on CT. Scattered periventricular and subcortical white matter change likely reflects small vessel ischemic microangiopathy. The posterior fossa, including the cerebellum, brainstem and fourth ventricle, is within normal limits. The third and lateral ventricles, and basal ganglia are unremarkable in appearance. The cerebral hemispheres are symmetric in appearance, with normal gray-white differentiation. No mass effect or midline shift is seen. There is no evidence of fracture; visualized osseous structures are unremarkable in appearance. The orbits are within normal limits. The paranasal sinuses and mastoid air cells are well-aerated. No significant soft tissue abnormalities are seen. IMPRESSION: 1. No acute intracranial pathology seen on CT. 2. Mild small vessel ischemic microangiopathy. Electronically Signed   By: Garald Balding M.D.   On: 10/14/2015 22:39   Mr Jodene Nam Abdomen W Wo Contrast  10/15/2015  CLINICAL DATA:  Increase in bp, weakness, EXAM: MRA ABDOMEN AND PELVIS WITH CONTRAST TECHNIQUE: Multiplanar, multiecho pulse sequences of the abdomen and pelvis were obtained with intravenous contrast. Angiographic images of abdomen and pelvis were obtained using MRA technique with intravenous contrast. CONTRAST:  39mL MULTIHANCE GADOBENATE DIMEGLUMINE 529 MG/ML IV SOLN discussed gfr with Dr.Stahl, gfr=48 today, reduced dose, COMPARISON:  CT 11/09/2014 and previous FINDINGS: Arterial findings: Aorta: Mild tortuosity. Mild atheromatous irregularity. No dissection, aneurysm, or stenosis. Celiac axis:         Patent Superior mesenteric: Patent Left renal:          Single, patent Right renal: Single, with short-segment ostial stenosis of at least moderate severity, patent distally Inferior mesenteric: Diminutive, patent Left iliac:          Tortuous, without aneurysm or stenosis. Right iliac:         Tortuous, without  aneurysm or stenosis. Venous findings: Patent hepatic veins, portal vein, SMV, IMV, splenic vein, renal veins, IVC. Review of the MIP images confirms the above findings. Nonvascular findings: No pleural effusion. Unremarkable limited evaluation of liver, spleen, adrenal glands, pancreas, left kidney. There is a 17 mm fluid signal lesion in the interpolar region of the right kidney, otherwise unremarkable. No hydronephrosis. Stomach and visualized portions of small bowel and colon are nondilated. Metallic Susceptibility artifact in the posterior fundus and gastric body lumen may be related to ingested material. No ascites. IMPRESSION: 1. Ostial right renal artery stenosis, of possible hemodynamic significance. If there is clinical concern of renovascular hypertension, Consider catheter angiography for further evaluation, as the lesion may be amenable to percutaneous treatment if confirmed to be significant. Electronically Signed   By: Lucrezia Europe M.D.   On: 10/15/2015 12:00    Microbiology: Recent Results (from the past 240 hour(s))  Urine culture     Status: None   Collection Time:  10/14/15  9:38 PM  Result Value Ref Range Status   Specimen Description URINE, CLEAN CATCH  Final   Special Requests NONE  Final   Culture   Final    2,000 COLONIES/mL INSIGNIFICANT GROWTH Performed at [facility]    Report Status 10/16/2015 FINAL  Final     Labs: Basic Metabolic Panel:  Recent Labs Lab 10/14/15 2105 10/15/15 0510 10/16/15 0616 10/17/15 0739  NA 129* 135 138 136  K 3.3* 3.9 3.6 3.5  CL 92* 97* 103 98*  CO2 27 28 31 30   GLUCOSE 124* 108* 100* 114*  BUN 20 16 15 12   CREATININE 1.13* 1.07* 1.13* 1.05*  CALCIUM 8.6* 8.7* 8.6* 9.0  MG 1.8  --   --   --    Liver Function Tests:  Recent Labs Lab 10/14/15 2105 10/16/15 0616  AST 33 22  ALT 32 23  ALKPHOS 61 53  BILITOT 0.6 0.6  PROT 7.1 6.1*  ALBUMIN 4.1 3.5   No results for input(s): LIPASE, AMYLASE in the last 168  hours. No results for input(s): AMMONIA in the last 168 hours. CBC:  Recent Labs Lab 10/14/15 2105 10/15/15 0510 10/16/15 0616 10/17/15 0739  WBC 3.8* 3.3* 3.0* 3.8*  NEUTROABS 2.5  --   --   --   HGB 11.4* 11.0* 10.9* 11.8*  HCT 34.0* 32.3* 32.5* 35.1*  MCV 85.6 85.9 86.7 86.2  PLT 128* 135* 122* 134*   Cardiac Enzymes:  Recent Labs Lab 10/14/15 2105  TROPONINI <0.03   BNP: BNP (last 3 results)  Recent Labs  10/14/15 2105  BNP 275.0*    ProBNP (last 3 results) No results for input(s): PROBNP in the last 8760 hours.  CBG: No results for input(s): GLUCAP in the last 168 hours.     Signed:  Saathvik Every MD.  [facility] 10/17/2015, 2:13 PM

## 2015-10-23 ENCOUNTER — Encounter (HOSPITAL_COMMUNITY): Payer: Self-pay | Admitting: Emergency Medicine

## 2015-10-23 ENCOUNTER — Emergency Department (HOSPITAL_COMMUNITY): Payer: Medicare Other

## 2015-10-23 ENCOUNTER — Emergency Department (HOSPITAL_COMMUNITY)
Admission: EM | Admit: 2015-10-23 | Discharge: 2015-10-24 | Disposition: A | Discharge status: Home / Self Care | Payer: Medicare Other | Attending: Emergency Medicine | Admitting: Emergency Medicine

## 2015-10-23 ENCOUNTER — Other Ambulatory Visit (HOSPITAL_COMMUNITY): Payer: Self-pay | Admitting: Nephrology

## 2015-10-23 DIAGNOSIS — I6789 Other cerebrovascular disease: Secondary | ICD-10-CM | POA: Diagnosis not present

## 2015-10-23 DIAGNOSIS — Z79899 Other long term (current) drug therapy: Secondary | ICD-10-CM | POA: Diagnosis not present

## 2015-10-23 DIAGNOSIS — F419 Anxiety disorder, unspecified: Secondary | ICD-10-CM | POA: Diagnosis not present

## 2015-10-23 DIAGNOSIS — E785 Hyperlipidemia, unspecified: Secondary | ICD-10-CM | POA: Insufficient documentation

## 2015-10-23 DIAGNOSIS — N183 Chronic kidney disease, stage 3 unspecified: Secondary | ICD-10-CM

## 2015-10-23 DIAGNOSIS — R519 Headache, unspecified: Secondary | ICD-10-CM

## 2015-10-23 DIAGNOSIS — I150 Renovascular hypertension: Secondary | ICD-10-CM | POA: Insufficient documentation

## 2015-10-23 DIAGNOSIS — R51 Headache: Secondary | ICD-10-CM | POA: Diagnosis not present

## 2015-10-23 DIAGNOSIS — I701 Atherosclerosis of renal artery: Secondary | ICD-10-CM | POA: Diagnosis not present

## 2015-10-23 DIAGNOSIS — J45909 Unspecified asthma, uncomplicated: Secondary | ICD-10-CM | POA: Insufficient documentation

## 2015-10-23 DIAGNOSIS — R0682 Tachypnea, not elsewhere classified: Secondary | ICD-10-CM | POA: Diagnosis not present

## 2015-10-23 DIAGNOSIS — I10 Essential (primary) hypertension: Secondary | ICD-10-CM | POA: Diagnosis not present

## 2015-10-23 DIAGNOSIS — M1990 Unspecified osteoarthritis, unspecified site: Secondary | ICD-10-CM | POA: Insufficient documentation

## 2015-10-23 DIAGNOSIS — D649 Anemia, unspecified: Secondary | ICD-10-CM | POA: Diagnosis not present

## 2015-10-23 DIAGNOSIS — E039 Hypothyroidism, unspecified: Secondary | ICD-10-CM | POA: Insufficient documentation

## 2015-10-23 DIAGNOSIS — R200 Anesthesia of skin: Secondary | ICD-10-CM | POA: Diagnosis present

## 2015-10-23 DIAGNOSIS — R29810 Facial weakness: Secondary | ICD-10-CM | POA: Diagnosis not present

## 2015-10-23 DIAGNOSIS — R202 Paresthesia of skin: Secondary | ICD-10-CM | POA: Insufficient documentation

## 2015-10-23 DIAGNOSIS — Z7982 Long term (current) use of aspirin: Secondary | ICD-10-CM | POA: Insufficient documentation

## 2015-10-23 LAB — I-STAT TROPONIN, ED: TROPONIN I, POC: 0 ng/mL (ref 0.00–0.08)

## 2015-10-23 LAB — I-STAT CHEM 8, ED
BUN: 17 mg/dL (ref 6–20)
CALCIUM ION: 1.08 mmol/L — AB (ref 1.13–1.30)
Chloride: 90 mmol/L — ABNORMAL LOW (ref 101–111)
Creatinine, Ser: 1.2 mg/dL — ABNORMAL HIGH (ref 0.44–1.00)
Glucose, Bld: 127 mg/dL — ABNORMAL HIGH (ref 65–99)
HEMATOCRIT: 33 % — AB (ref 36.0–46.0)
HEMOGLOBIN: 11.2 g/dL — AB (ref 12.0–15.0)
Potassium: 3.4 mmol/L — ABNORMAL LOW (ref 3.5–5.1)
SODIUM: 130 mmol/L — AB (ref 135–145)
TCO2: 28 mmol/L (ref 0–100)

## 2015-10-23 LAB — CBC
HCT: 31.5 % — ABNORMAL LOW (ref 36.0–46.0)
Hemoglobin: 11 g/dL — ABNORMAL LOW (ref 12.0–15.0)
MCH: 29.6 pg (ref 26.0–34.0)
MCHC: 34.9 g/dL (ref 30.0–36.0)
MCV: 84.7 fL (ref 78.0–100.0)
Platelets: 108 10*3/uL — ABNORMAL LOW (ref 150–400)
RBC: 3.72 MIL/uL — ABNORMAL LOW (ref 3.87–5.11)
RDW: 13.9 % (ref 11.5–15.5)
WBC: 3.2 10*3/uL — ABNORMAL LOW (ref 4.0–10.5)

## 2015-10-23 LAB — DIFFERENTIAL
BASOS ABS: 0 10*3/uL (ref 0.0–0.1)
BASOS PCT: 0 %
EOS ABS: 0 10*3/uL (ref 0.0–0.7)
EOS PCT: 1 %
Lymphocytes Relative: 23 %
Lymphs Abs: 0.7 10*3/uL (ref 0.7–4.0)
Monocytes Absolute: 0.3 10*3/uL (ref 0.1–1.0)
Monocytes Relative: 8 %
NEUTROS PCT: 68 %
Neutro Abs: 2.2 10*3/uL (ref 1.7–7.7)

## 2015-10-23 LAB — COMPREHENSIVE METABOLIC PANEL
ALBUMIN: 3.8 g/dL (ref 3.5–5.0)
ALK PHOS: 54 U/L (ref 38–126)
ALT: 23 U/L (ref 14–54)
ANION GAP: 13 (ref 5–15)
AST: 27 U/L (ref 15–41)
BUN: 15 mg/dL (ref 6–20)
CHLORIDE: 91 mmol/L — AB (ref 101–111)
CO2: 28 mmol/L (ref 22–32)
CREATININE: 1.26 mg/dL — AB (ref 0.44–1.00)
Calcium: 9.3 mg/dL (ref 8.9–10.3)
GFR calc Af Amer: 46 mL/min — ABNORMAL LOW (ref >60)
GFR calc non Af Amer: 39 mL/min — ABNORMAL LOW (ref >60)
Glucose, Bld: 129 mg/dL — ABNORMAL HIGH (ref 65–99)
Potassium: 3.5 mmol/L (ref 3.5–5.1)
Sodium: 132 mmol/L — ABNORMAL LOW (ref 135–145)
TOTAL PROTEIN: 6.6 g/dL (ref 6.5–8.1)
Total Bilirubin: 0.8 mg/dL (ref 0.3–1.2)

## 2015-10-23 LAB — PROTIME-INR
INR: 1.05 (ref 0.00–1.49)
PROTHROMBIN TIME: 13.9 s (ref 11.6–15.2)

## 2015-10-23 LAB — APTT: APTT: 38 s — AB (ref 24–37)

## 2015-10-23 MED ORDER — ONDANSETRON HCL 4 MG/2ML IJ SOLN
4.0000 mg | Freq: Once | INTRAMUSCULAR | Status: AC
  Administered 2015-10-23: 4 mg via INTRAVENOUS

## 2015-10-23 NOTE — ED Notes (Signed)
Patient transported to CT 

## 2015-10-23 NOTE — ED Notes (Signed)
PER [facility]: Patient to ED from home c/o tingling in all extremities and h/a after waking up from a nap at 2030 this evening. Patient hx HTN, PCP increased dose and pt has had a total of 200mg  hydralazine since this morning - 50mg  in the morning, 50mg  at 3p and 100mg  at 7p. EMS VS: 172/74, HR 63, 99% RA, 18 RR, CBG 118. Patient c/o nausea upon entering the ED room and vomited x 1. Pt appears to have red spotted rash on breasts and belly - not normal per pt. PT A&O x 4.

## 2015-10-23 NOTE — ED Provider Notes (Signed)
CSN: [id number]     Arrival date & time 10/23/15  2211 History   First MD Initiated Contact with Patient 10/23/15 2224     Chief Complaint  Patient presents with  . Headache  . Numbness     (Consider location/radiation/quality/duration/timing/severity/associated sxs/prior Treatment) The history is provided by the patient and medical records. No language interpreter was used.     [name] is a 82 y.o. female  with a hx of CKD, HTN, renal insufficiency, arthritis, anemia, asthma, right renal artery stenosis presents to the Emergency Department complaining of persistent "feeling funny" onset around 8:30PM after waking from a nap.  Pt reports her SBP this morning was > 200 therefore she took clonidine 0.1mg  at 11:30am, 1pm, 2:30pm and 7pm.  Slept then for 1.5 hrs and awoke shaky and tingling all over.  Daughter reports SBP at that time was 180.  Pt reports that her feet continue to feel numb but her arms are better. Her head feels heavy.  Emesis x1 after arrival in the ED.  Pt was seen 1 week ago for HTN and admitted from [facility].  MRI at that time showed right renal artery stenosis was determined to be the cause of her HTN.  She is scheduled for a renal angiogram on 11/12/15. No aggravating or alleviating factors. Pt denies fever, chills, neck pain, chest pain, SOB, abd pain, diarrhea, syncope, dysuria, hematuria.    Past Medical History  Diagnosis Date  . Nodule of right lung     Right upper lobe  . Hyperlipidemia   . Hypertension   . Hypothyroidism   . Renal insufficiency     Chronic  . Arthritis   . CKD (chronic kidney disease), stage III   . Anemia   . Renal vascular disease   . Asthma   . Right renal artery stenosis ([place]) 05/09/2015   Past Surgical History  Procedure Laterality Date  . Thyroidectomy, partial     Family History  Problem Relation Age of Onset  . Thyroid disease Mother   . Stroke Father   . Hypertension Brother    Social History  Substance Use Topics  .  Smoking status: Never Smoker   . Smokeless tobacco: None     Comment: Pt denies cigarettes  . Alcohol Use: No   OB History    No data available     Review of Systems  Constitutional: Negative for fever, diaphoresis, appetite change, fatigue and unexpected weight change.  HENT: Negative for mouth sores.   Eyes: Negative for visual disturbance.  Respiratory: Negative for cough, chest tightness, shortness of breath and wheezing.   Cardiovascular: Negative for chest pain.  Gastrointestinal: Negative for nausea, vomiting, abdominal pain, diarrhea and constipation.  Endocrine: Negative for polydipsia, polyphagia and polyuria.  Genitourinary: Negative for dysuria, urgency, frequency and hematuria.  Musculoskeletal: Negative for back pain and neck stiffness.  Skin: Negative for rash.  Allergic/Immunologic: Negative for immunocompromised state.  Neurological: Positive for weakness and headaches. Negative for syncope and light-headedness.  Hematological: Does not bruise/bleed easily.  Psychiatric/Behavioral: Negative for sleep disturbance. The patient is not nervous/anxious.       Allergies  Amlodipine; Clonidine derivatives; Isosorbide; Sulfonamide derivatives; and Terazosin  Home Medications   Prior to Admission medications   Medication Sig Start Date End Date Taking? Authorizing Provider  aspirin EC 81 MG tablet Take 81 mg by mouth every other day.   Yes Historical Provider, MD  atenolol-chlorthalidone (TENORETIC) 50-25 MG tablet Take 1 tablet by mouth  daily. 07/24/15  Yes Chipper Herb, MD  atorvastatin (LIPITOR) 20 MG tablet TAKE 1/2 TABLET DAILY 06/30/15  Yes Chipper Herb, MD  calcium-vitamin D (OSCAL WITH D) 500-200 MG-UNIT per tablet Take 1 tablet by mouth daily.     Yes Historical Provider, MD  cloNIDine (CATAPRES) 0.1 MG tablet Take 0.1 mg by mouth 2 (two) times daily.  09/13/14  Yes Historical Provider, MD  ferrous sulfate ([employer] IRON) 325 (65 FE) MG tablet Take 325 mg by  mouth 2 (two) times daily with a meal.   Yes Historical Provider, MD  flecainide (TAMBOCOR) 50 MG tablet TAKE 1 TABLET (50 MG TOTAL) BY MOUTH 2 (TWO) TIMES DAILY. 02/05/15  Yes Deboraha Sprang, MD  hydrALAZINE (APRESOLINE) 50 MG tablet Take 100 mg by mouth 2 (two) times daily.    Yes Historical Provider, MD  hydroxypropyl methylcellulose / hypromellose (ISOPTO TEARS / GONIOVISC) 2.5 % ophthalmic solution Place 1 drop into both eyes as needed for dry eyes.   Yes Historical Provider, MD  levothyroxine (SYNTHROID, LEVOTHROID) 75 MCG tablet TAKE 1 TABLET (75 MCG TOTAL) BY MOUTH DAILY. 05/28/15  Yes Chipper Herb, MD  lisinopril (PRINIVIL,ZESTRIL) 40 MG tablet Take 1 tablet (40 mg total) by mouth 2 (two) times daily. 10/17/15  Yes Rexene Alberts, MD  Omega-3 Fatty Acids (FISH OIL) 1200 MG CAPS Take 1,200 mg by mouth daily.   Yes Historical Provider, MD  ALPRAZolam (XANAX) 0.25 MG tablet Take 1 tablet (0.25 mg total) by mouth at bedtime as needed for anxiety. 10/24/15   Terie Lear, PA-C   BP 151/58 mmHg  Pulse 65  Temp(Src) 97.2 F (36.2 C)  Resp 16  Ht 5\' 6"  (1.676 m)  Wt 55.792 kg  BMI 19.86 kg/m2  SpO2 94% Physical Exam  Constitutional: She is oriented to person, place, and time. She appears well-developed and well-nourished. No distress.  HENT:  Head: Normocephalic and atraumatic.  Mouth/Throat: Oropharynx is clear and moist.  Eyes: Conjunctivae and EOM are normal. Pupils are equal, round, and reactive to light. No scleral icterus.  No horizontal, vertical or rotational nystagmus  Neck: Normal range of motion. Neck supple.  Full active and passive ROM without pain No midline or paraspinal tenderness No nuchal rigidity or meningeal signs  Cardiovascular: Normal rate, regular rhythm and intact distal pulses.   Pulmonary/Chest: Effort normal and breath sounds normal. No respiratory distress. She has no wheezes. She has no rales.  Abdominal: Soft. Bowel sounds are normal. There is no  tenderness. There is no rebound and no guarding.  Musculoskeletal: Normal range of motion.  Lymphadenopathy:    She has no cervical adenopathy.  Neurological: She is alert and oriented to person, place, and time. She has normal reflexes. No cranial nerve deficit. She exhibits normal muscle tone. Coordination normal.  Mental Status:  Alert, oriented, thought content appropriate. Speech fluent without evidence of aphasia. Able to follow 2 step commands without difficulty.  Cranial Nerves:  II:  Peripheral visual fields grossly normal, pupils equal, round, reactive to light III,IV, VI: ptosis not present, extra-ocular motions intact bilaterally  V,VII: smile symmetric, facial light touch sensation equal VIII: hearing grossly normal bilaterally  IX,X: midline uvula rise  XI: bilateral shoulder shrug equal and strong XII: midline tongue extension  Motor:  5/5 in upper and lower extremities bilaterally including strong and equal grip strength and dorsiflexion/plantar flexion Sensory: Pinprick and light touch normal in all extremities.  Deep Tendon Reflexes: 2+ and symmetric  Cerebellar: normal  finger-to-nose with bilateral upper extremities Gait: gait testing deferred CV: distal pulses palpable throughout   Skin: Skin is warm and dry. No rash noted. She is not diaphoretic.  Psychiatric: She has a normal mood and affect. Her behavior is normal. Judgment and thought content normal.  Nursing note and vitals reviewed.   ED Course  Procedures (including critical care time) Labs Review Labs Reviewed  APTT - Abnormal; Notable for the following:    aPTT 38 (*)    All other components within normal limits  CBC - Abnormal; Notable for the following:    WBC 3.2 (*)    RBC 3.72 (*)    Hemoglobin 11.0 (*)    HCT 31.5 (*)    Platelets 108 (*)    All other components within normal limits  COMPREHENSIVE METABOLIC PANEL - Abnormal; Notable for the following:    Sodium 132 (*)    Chloride 91 (*)     Glucose, Bld 129 (*)    Creatinine, Ser 1.26 (*)    GFR calc non Af Amer 39 (*)    GFR calc Af Amer 46 (*)    All other components within normal limits  I-STAT CHEM 8, ED - Abnormal; Notable for the following:    Sodium 130 (*)    Potassium 3.4 (*)    Chloride 90 (*)    Creatinine, Ser 1.20 (*)    Glucose, Bld 127 (*)    Calcium, Ion 1.08 (*)    Hemoglobin 11.2 (*)    HCT 33.0 (*)    All other components within normal limits  PROTIME-INR  DIFFERENTIAL  URINE RAPID DRUG SCREEN, HOSP PERFORMED  URINALYSIS, ROUTINE W REFLEX MICROSCOPIC (NOT AT [facility])  ETHANOL  I-STAT TROPOININ, ED    Imaging Review Ct Head Wo Contrast  10/24/2015  CLINICAL DATA:  Headaches, hypertension, paresthesias, nausea, and vomiting. EXAM: CT HEAD WITHOUT CONTRAST TECHNIQUE: Contiguous axial images were obtained from the base of the skull through the vertex without intravenous contrast. COMPARISON:  10/14/2015 FINDINGS: Mild cerebral atrophy. No ventricular dilatation. Patchy low-attenuation changes in the deep white matter consistent small vessel ischemia. Probable old focal infarct in the left anterior frontal white matter, unchanged since previous study. No mass effect or midline shift. No abnormal extra-axial fluid collections. Gray-white matter junctions are distinct. Basal cisterns are not effaced. No evidence of acute intracranial hemorrhage. No depressed skull fractures. Visualized paranasal sinuses and mastoid air cells are not opacified. Vascular calcifications. IMPRESSION: No acute intracranial abnormalities. Mild atrophy and small vessel ischemic changes. Electronically Signed   By: Lucienne Capers M.D.   On: 10/24/2015 00:03   I have personally reviewed and evaluated these images and lab results as part of my medical decision-making.   EKG Interpretation   Date/Time:  Tuesday October 23 2015 22:23:11 EST Ventricular Rate:  67 PR Interval:  233 QRS Duration: 112 QT Interval:  484 QTC Calculation:  511 R Axis:   96 Text Interpretation:  Sinus rhythm Prolonged PR interval Incomplete right  bundle branch block Consider left ventricular hypertrophy Prolonged QT  interval Baseline wander in lead(s) V1 No significant change since last  tracing Confirmed by Winfred Leeds  MD, SAM [phone]) on 10/23/2015 10:42:48 PM      MDM   Final diagnoses:  Paresthesias  Renovascular hypertension  Anxiety  Right renal artery stenosis (HCC)  Nonintractable headache, unspecified chronicity pattern, unspecified headache type   [name] presents with headache, paresthesias and hypertension.  Pt persistently hypertensive even after  4 doses of clonidine in addition to all her her other home BP medications.  Patient ambulates without difficulty here in the emergency department to the bathroom.  No date disturbance or ataxia.  Patient's daughters at bedside report that patient has significant anxiety over her hypertension. They report that she takes her blood pressure every few minutes at home becoming more and more anxious each time it climbs.    1:42 AM Labs are reassuring. No evidence of urinary tract infection. Mild hyponatremia at baseline. Elevated serum creatinine at baseline.  CT head without acute abnormalities. Patient anxious about discharge home however I believe she is safe to do so. She has pending renal arteriogram and hypertension will persist until this is resolved.  She only recently had a change in her blood pressure medications in an attempt to gain better control. Recommend that she continue taking her medications as directed.  Patient's family and patient state understanding and are in agreement with the plan for discharge home. Strict return precautions given including worsening symptoms, syncope, [place] headache or other concerns.  The patient was discussed with and seen by Dr. Leonides Schanz who agrees with the treatment plan.   Janet Soho Ryson Bacha, PA-C 10/24/15 [place],  DO 10/24/15 [id number]

## 2015-10-23 NOTE — ED Notes (Signed)
Patient ambulated with 2 person assist to the restroom. Gait steady, pt's R arm shakes intermittently, this is normal for patient.

## 2015-10-24 ENCOUNTER — Telehealth: Payer: Self-pay | Admitting: Family Medicine

## 2015-10-24 LAB — RAPID URINE DRUG SCREEN, HOSP PERFORMED
AMPHETAMINES: NOT DETECTED
BARBITURATES: NOT DETECTED
BENZODIAZEPINES: NOT DETECTED
Cocaine: NOT DETECTED
Opiates: NOT DETECTED
Tetrahydrocannabinol: NOT DETECTED

## 2015-10-24 LAB — URINALYSIS, ROUTINE W REFLEX MICROSCOPIC
BILIRUBIN URINE: NEGATIVE
Glucose, UA: NEGATIVE mg/dL
HGB URINE DIPSTICK: NEGATIVE
Ketones, ur: NEGATIVE mg/dL
Leukocytes, UA: NEGATIVE
NITRITE: NEGATIVE
PROTEIN: NEGATIVE mg/dL
SPECIFIC GRAVITY, URINE: 1.009 (ref 1.005–1.030)
pH: 7.5 (ref 5.0–8.0)

## 2015-10-24 MED ORDER — ALPRAZOLAM 0.25 MG PO TABS: 0.2500 mg | ORAL_TABLET | Freq: Every evening | ORAL | Status: DC | PRN

## 2015-10-24 NOTE — Discharge Instructions (Signed)
1. Medications: xanax for anxiety, usual home medications 2. Treatment: rest, drink plenty of fluids, continue taking your home medications as directed 3. Follow Up: Please followup with your primary doctor in 1-2 days for discussion of your diagnoses and further evaluation after today's visit; if you do not have a primary care doctor use the resource guide provided to find one; Please return to the ER for worsening symptoms, new symptoms or other concerns

## 2015-10-24 NOTE — ED Notes (Signed)
Pt back to A02 from CT

## 2015-10-24 NOTE — Telephone Encounter (Signed)
Pt has an appt 3/14 at 3:00.

## 2015-10-29 DIAGNOSIS — N2889 Other specified disorders of kidney and ureter: Secondary | ICD-10-CM | POA: Diagnosis not present

## 2015-10-29 DIAGNOSIS — I129 Hypertensive chronic kidney disease with stage 1 through stage 4 chronic kidney disease, or unspecified chronic kidney disease: Secondary | ICD-10-CM | POA: Diagnosis not present

## 2015-10-29 DIAGNOSIS — N183 Chronic kidney disease, stage 3 (moderate): Secondary | ICD-10-CM | POA: Diagnosis not present

## 2015-10-29 DIAGNOSIS — E785 Hyperlipidemia, unspecified: Secondary | ICD-10-CM | POA: Diagnosis not present

## 2015-10-30 ENCOUNTER — Telehealth (HOSPITAL_COMMUNITY): Payer: Self-pay | Admitting: Radiology

## 2015-10-30 ENCOUNTER — Encounter: Payer: Self-pay | Admitting: Family Medicine

## 2015-10-30 ENCOUNTER — Ambulatory Visit (INDEPENDENT_AMBULATORY_CARE_PROVIDER_SITE_OTHER): Payer: Medicare Other | Admitting: Family Medicine

## 2015-10-30 ENCOUNTER — Other Ambulatory Visit: Payer: Self-pay | Admitting: Radiology

## 2015-10-30 VITALS — BP 176/59 | HR 48 | Temp 97.3°F | Ht 66.0 in | Wt 126.0 lb

## 2015-10-30 DIAGNOSIS — N189 Chronic kidney disease, unspecified: Secondary | ICD-10-CM | POA: Diagnosis not present

## 2015-10-30 DIAGNOSIS — E871 Hypo-osmolality and hyponatremia: Secondary | ICD-10-CM

## 2015-10-30 DIAGNOSIS — Z09 Encounter for follow-up examination after completed treatment for conditions other than malignant neoplasm: Secondary | ICD-10-CM

## 2015-10-30 DIAGNOSIS — E876 Hypokalemia: Secondary | ICD-10-CM | POA: Diagnosis not present

## 2015-10-30 DIAGNOSIS — I10 Essential (primary) hypertension: Secondary | ICD-10-CM | POA: Diagnosis not present

## 2015-10-30 DIAGNOSIS — I701 Atherosclerosis of renal artery: Secondary | ICD-10-CM | POA: Diagnosis not present

## 2015-10-30 LAB — CBC WITH DIFFERENTIAL/PLATELET
BASOS ABS: 0 10*3/uL (ref 0.0–0.2)
Basos: 1 %
EOS (ABSOLUTE): 0 10*3/uL (ref 0.0–0.4)
EOS: 1 %
HEMATOCRIT: 33.7 % — AB (ref 34.0–46.6)
HEMOGLOBIN: 11.2 g/dL (ref 11.1–15.9)
IMMATURE GRANULOCYTES: 0 %
Immature Grans (Abs): 0 10*3/uL (ref 0.0–0.1)
LYMPHS: 22 %
Lymphocytes Absolute: 0.7 10*3/uL (ref 0.7–3.1)
MCH: 28.7 pg (ref 26.6–33.0)
MCHC: 33.2 g/dL (ref 31.5–35.7)
MCV: 86 fL (ref 79–97)
MONOCYTES: 17 %
Monocytes Absolute: 0.5 10*3/uL (ref 0.1–0.9)
NEUTROS PCT: 59 %
Neutrophils Absolute: 1.8 10*3/uL (ref 1.4–7.0)
Platelets: 150 10*3/uL (ref 150–379)
RBC: 3.9 x10E6/uL (ref 3.77–5.28)
RDW: 14.1 % (ref 12.3–15.4)
WBC: 3.1 10*3/uL — AB (ref 3.4–10.8)

## 2015-10-30 LAB — BMP8+EGFR
BUN / CREAT RATIO: 13 (ref 11–26)
BUN: 15 mg/dL (ref 8–27)
CALCIUM: 9 mg/dL (ref 8.7–10.3)
CHLORIDE: 92 mmol/L — AB (ref 96–106)
CO2: 28 mmol/L (ref 18–29)
CREATININE: 1.17 mg/dL — AB (ref 0.57–1.00)
GFR calc Af Amer: 51 mL/min/{1.73_m2} — ABNORMAL LOW (ref >59)
GFR calc non Af Amer: 44 mL/min/{1.73_m2} — ABNORMAL LOW (ref >59)
GLUCOSE: 108 mg/dL — AB (ref 65–99)
Potassium: 4.3 mmol/L (ref 3.5–5.2)
Sodium: 135 mmol/L (ref 134–144)

## 2015-10-30 NOTE — Progress Notes (Signed)
Subjective:    Patient ID: [name], female    DOB: 05-02-1934, 82 y.o.   MRN: [id number]  HPI Patient here today for hospital follow up. She has recently been to [facility] and [facility] for problems regarding her BP.This is a follow-up for recent visit to the hospital in [place] on February 26. Another visit occurred to: Hospital in [place] on March 7. On the most recent visit her blood pressure was very elevated and she had a lot of anxiety and paresthesias. She was taken on the second visit by EMS. She had a diagnosis of hypertensive crisis hyponatremia and renal artery stenosis. An angiogram is scheduled for tomorrow at [facility]. She Will Get a CBC and BMP Today. This Patient Has Had Long-Standing with Her Blood Pressure and Getting under Good Control. She Is Seeing the Nephrologist Help [place] with This. The patient appears calm. She is scheduled to be in the hospital at 7:30 in the morning and will have the procedure done at about 9 AM. She says her blood pressure has been up more in the past 3 weeks and when it is up like this she is more anxious and she has more headache associated with this. Just prior to coming to the office today she said her blood pressure was 146/70. When the nurse checked it here it was 176/59. When I checked it in the office it was 190/80 in the left arm with a regular cuff sitting. She denies any chest pain or shortness of breath. She is just weak from all the issues with her blood pressure. She is not having any GI symptoms or voiding symptoms.     Patient Active Problem List   Diagnosis Date Noted  . Essential hypertension, malignant   . Hypothyroidism 10/15/2015  . Pancytopenia ([place]) 10/15/2015  . Hyponatremia 10/14/2015  . Thrombocytopenia ([place]) 06/16/2015  . Right renal artery stenosis ([place]) 05/09/2015  . Chronic diastolic heart failure ([place]) 04/03/2014  . Acute diastolic heart failure ([place]) 04/03/2014  . Hypertensive urgency 04/02/2014  . Headache  04/02/2014  . CKD (chronic kidney disease) stage 3, GFR 30-59 ml/min 04/02/2014  . Paroxysmal atrial fibrillation ([place]) 01/25/2014  . Hypokalemia 07/19/2013  . Anemia 07/19/2013  . Bilateral lower extremity edema 06/30/2013  . Hypertension with fluid overload 06/30/2013  . Need for prophylactic vaccination and inoculation against influenza 05/31/2013  . Pedal edema 05/10/2013  . Varicose veins of lower extremities with other complications 40/03/6760  . ABNORMAL STRESS ELECTROCARDIOGRAM 01/23/2010  . HYPOTHYROIDISM 12/18/2009  . HYPERLIPIDEMIA 12/18/2009  . Essential hypertension 12/18/2009  . RENAL INSUFFICIENCY, CHRONIC 12/18/2009  . ARTHRITIS 12/18/2009   Outpatient Encounter Prescriptions as of 10/30/2015  Medication Sig  . ALPRAZolam (XANAX) 0.25 MG tablet Take 1 tablet (0.25 mg total) by mouth at bedtime as needed for anxiety.  Marland Kitchen aspirin EC 81 MG tablet Take 81 mg by mouth every other day.  Marland Kitchen atenolol-chlorthalidone (TENORETIC) 50-25 MG tablet Take 1 tablet by mouth daily.  Marland Kitchen atorvastatin (LIPITOR) 20 MG tablet TAKE 1/2 TABLET DAILY  . calcium-vitamin D (OSCAL WITH D) 500-200 MG-UNIT per tablet Take 1 tablet by mouth daily.    . cloNIDine (CATAPRES) 0.1 MG tablet Take 0.1 mg by mouth 2 (two) times daily.   . ferrous sulfate ([employer]S IRON) 325 (65 FE) MG tablet Take 325 mg by mouth 2 (two) times daily with a meal.  . flecainide (TAMBOCOR) 50 MG tablet TAKE 1 TABLET (50 MG TOTAL) BY MOUTH 2 (TWO) TIMES DAILY.  Marland Kitchen  hydrALAZINE (APRESOLINE) 50 MG tablet Take 50 mg by mouth 2 (two) times daily.   . hydroxypropyl methylcellulose / hypromellose (ISOPTO TEARS / GONIOVISC) 2.5 % ophthalmic solution Place 1 drop into both eyes as needed for dry eyes.  Marland Kitchen levothyroxine (SYNTHROID, LEVOTHROID) 75 MCG tablet TAKE 1 TABLET (75 MCG TOTAL) BY MOUTH DAILY.  Marland Kitchen lisinopril (PRINIVIL,ZESTRIL) 40 MG tablet Take 1 tablet (40 mg total) by mouth 2 (two) times daily.  . Omega-3 Fatty Acids (FISH OIL) 1200 MG CAPS  Take 1,200 mg by mouth daily.   No facility-administered encounter medications on file as of 10/30/2015.     Review of Systems  Constitutional: Positive for fatigue.  Eyes: Negative.   Respiratory: Negative.   Cardiovascular: Negative.   Gastrointestinal: Negative.   Endocrine: Negative.   Genitourinary: Negative.   Musculoskeletal: Negative.   Skin: Negative.   Allergic/Immunologic: Negative.   Neurological: Positive for headaches (when BP is high).  Hematological: Negative.   Psychiatric/Behavioral: Negative.        Objective:   Physical Exam  Constitutional: She is oriented to person, place, and time. She appears well-developed and well-nourished. No distress.  HENT:  Head: Normocephalic.  Right Ear: External ear normal.  Left Ear: External ear normal.  Nose: Nose normal.  Mouth/Throat: Oropharynx is clear and moist.  Eyes: Conjunctivae and EOM are normal. Pupils are equal, round, and reactive to light. Right eye exhibits no discharge. Left eye exhibits no discharge. No scleral icterus.  Neck: Normal range of motion. Neck supple. No thyromegaly present.  Cardiovascular: Normal rate and normal heart sounds.   No murmur heard. The heart was regular at 48/m  Pulmonary/Chest: Effort normal and breath sounds normal. No respiratory distress. She has no wheezes. She has no rales.  Abdominal: Soft. Bowel sounds are normal. She exhibits no mass. There is no tenderness. There is no rebound and no guarding.  Musculoskeletal: Normal range of motion. She exhibits no edema.  Lymphadenopathy:    She has no cervical adenopathy.  Neurological: She is alert and oriented to person, place, and time.  Skin: Skin is warm and dry. No rash noted.  Psychiatric: She has a normal mood and affect. Her behavior is normal. Judgment and thought content normal.  The patient does not appear to be anxious. We had a long friendly conversation about everything that was going on besides health issues with  her and I checked her blood pressure after this and it was 190/80.  Nursing note and vitals reviewed.   BP 176/59 mmHg  Pulse 48  Temp(Src) 97.3 F (36.3 C) (Oral)  Ht _0  (1.676 m)  Wt 126 lb (57.153 kg)  BMI 20.35 kg/m2       Assessment & Plan:  1. Hospital discharge follow-up -Follow-up with renal angiogram in the morning as planned - BMP8+EGFR - CBC with Differential/Platelet  2. Essential hypertension -No change in blood pressure medicine pending results of study tomorrow -Follow-up with nephrologist as planned  3. Hyponatremia -BMP today  4. Hypokalemia - BMP8+EGFR - CBC with Differential/Platelet  5. RENAL INSUFFICIENCY, CHRONIC  6. Right renal artery stenosis [facility]) -Renal arteriogram is being planned for tomorrow  Patient Instructions  The patient is scheduled for a renal angiogram in the morning with possible stenting. She'll get a stat CBC and BMP today to follow-up on renal function and her history of hyponatremia.   Arrie Senate MD

## 2015-10-30 NOTE — Patient Instructions (Signed)
The patient is scheduled for a renal angiogram in the morning with possible stenting. She'll get a stat CBC and BMP today to follow-up on renal function and her history of hyponatremia.

## 2015-10-31 ENCOUNTER — Encounter: Payer: Self-pay | Admitting: Family Medicine

## 2015-10-31 ENCOUNTER — Ambulatory Visit (HOSPITAL_COMMUNITY)
Admission: RE | Admit: 2015-10-31 | Discharge: 2015-10-31 | Disposition: A | Discharge status: Home / Self Care | Payer: Medicare Other | Source: Ambulatory Visit | Attending: Nephrology | Admitting: Nephrology

## 2015-10-31 ENCOUNTER — Other Ambulatory Visit (HOSPITAL_COMMUNITY): Payer: Self-pay | Admitting: Nephrology

## 2015-10-31 ENCOUNTER — Encounter (HOSPITAL_COMMUNITY): Payer: Self-pay

## 2015-10-31 ENCOUNTER — Other Ambulatory Visit: Payer: Self-pay | Admitting: Radiology

## 2015-10-31 DIAGNOSIS — J45909 Unspecified asthma, uncomplicated: Secondary | ICD-10-CM | POA: Insufficient documentation

## 2015-10-31 DIAGNOSIS — Z7982 Long term (current) use of aspirin: Secondary | ICD-10-CM | POA: Insufficient documentation

## 2015-10-31 DIAGNOSIS — M1990 Unspecified osteoarthritis, unspecified site: Secondary | ICD-10-CM | POA: Insufficient documentation

## 2015-10-31 DIAGNOSIS — J4489 Other specified chronic obstructive pulmonary disease: Secondary | ICD-10-CM | POA: Insufficient documentation

## 2015-10-31 DIAGNOSIS — N183 Chronic kidney disease, stage 3 unspecified: Secondary | ICD-10-CM

## 2015-10-31 DIAGNOSIS — D649 Anemia, unspecified: Secondary | ICD-10-CM | POA: Diagnosis not present

## 2015-10-31 DIAGNOSIS — E039 Hypothyroidism, unspecified: Secondary | ICD-10-CM | POA: Insufficient documentation

## 2015-10-31 DIAGNOSIS — I701 Atherosclerosis of renal artery: Secondary | ICD-10-CM | POA: Insufficient documentation

## 2015-10-31 DIAGNOSIS — E785 Hyperlipidemia, unspecified: Secondary | ICD-10-CM | POA: Diagnosis not present

## 2015-10-31 DIAGNOSIS — I129 Hypertensive chronic kidney disease with stage 1 through stage 4 chronic kidney disease, or unspecified chronic kidney disease: Secondary | ICD-10-CM | POA: Insufficient documentation

## 2015-10-31 DIAGNOSIS — R911 Solitary pulmonary nodule: Secondary | ICD-10-CM | POA: Insufficient documentation

## 2015-10-31 DIAGNOSIS — N2889 Other specified disorders of kidney and ureter: Secondary | ICD-10-CM | POA: Insufficient documentation

## 2015-10-31 LAB — BASIC METABOLIC PANEL
Anion gap: 9 (ref 5–15)
BUN: 12 mg/dL (ref 6–20)
CHLORIDE: 98 mmol/L — AB (ref 101–111)
CO2: 29 mmol/L (ref 22–32)
Calcium: 9.5 mg/dL (ref 8.9–10.3)
Creatinine, Ser: 1.24 mg/dL — ABNORMAL HIGH (ref 0.44–1.00)
GFR calc Af Amer: 47 mL/min — ABNORMAL LOW (ref >60)
GFR calc non Af Amer: 40 mL/min — ABNORMAL LOW (ref >60)
GLUCOSE: 119 mg/dL — AB (ref 65–99)
POTASSIUM: 3.8 mmol/L (ref 3.5–5.1)
SODIUM: 136 mmol/L (ref 135–145)

## 2015-10-31 LAB — PROTIME-INR
INR: 1.15 (ref 0.00–1.49)
PROTHROMBIN TIME: 14.9 s (ref 11.6–15.2)

## 2015-10-31 LAB — APTT: aPTT: 42 seconds — ABNORMAL HIGH (ref 24–37)

## 2015-10-31 MED ORDER — MIDAZOLAM HCL 2 MG/2ML IJ SOLN
INTRAMUSCULAR | Status: AC
  Filled 2015-10-31: qty 4

## 2015-10-31 MED ORDER — SODIUM CHLORIDE 0.9 % IV SOLN: INTRAVENOUS | Status: DC

## 2015-10-31 MED ORDER — IOHEXOL 300 MG/ML  SOLN: 100.0000 mL | Freq: Once | INTRAMUSCULAR | Status: DC | PRN

## 2015-10-31 MED ORDER — IODIXANOL 320 MG/ML IV SOLN
100.0000 mL | Freq: Once | INTRAVENOUS | Status: AC | PRN
  Administered 2015-10-31: 7 mL via INTRAVENOUS

## 2015-10-31 MED ORDER — SODIUM CHLORIDE 0.9 % IV SOLN: Freq: Once | INTRAVENOUS | Status: DC

## 2015-10-31 MED ORDER — HEPARIN SODIUM (PORCINE) 1000 UNIT/ML IJ SOLN
INTRAMUSCULAR | Status: AC | PRN
  Administered 2015-10-31: 3000 [IU] via INTRAVENOUS

## 2015-10-31 MED ORDER — LIDOCAINE HCL 1 % IJ SOLN
INTRAMUSCULAR | Status: AC
  Filled 2015-10-31: qty 20

## 2015-10-31 MED ORDER — FENTANYL CITRATE (PF) 100 MCG/2ML IJ SOLN
INTRAMUSCULAR | Status: AC | PRN
  Administered 2015-10-31: 50 ug via INTRAVENOUS

## 2015-10-31 MED ORDER — HEPARIN SODIUM (PORCINE) 1000 UNIT/ML IJ SOLN
INTRAMUSCULAR | Status: AC
  Filled 2015-10-31: qty 1

## 2015-10-31 MED ORDER — MIDAZOLAM HCL 2 MG/2ML IJ SOLN
INTRAMUSCULAR | Status: AC | PRN
  Administered 2015-10-31: 1 mg via INTRAVENOUS

## 2015-10-31 MED ORDER — FENTANYL CITRATE (PF) 100 MCG/2ML IJ SOLN
INTRAMUSCULAR | Status: AC
  Filled 2015-10-31: qty 4

## 2015-10-31 MED ORDER — SODIUM CHLORIDE 0.9 % IV SOLN
INTRAVENOUS | Status: AC | PRN
  Administered 2015-10-31: 10 mL/h via INTRAVENOUS

## 2015-10-31 NOTE — Procedures (Signed)
Interventional Radiology Procedure Note  Procedure:  Bilateral renal arteriography; right renal artery stent placement  Complications:  None  Estimated Blood Loss: < 10 mL  Contrast:  CO2 and 6 mL Visipaque 320 contrast  Findings:  Significant proximal right renal artery stenosis with systolic pressure gradient of 48 mm Hg. No evidence of left RAS. Right RA treated with placement of 6 mm x 15 mm Herculink balloon expandable stent. Artery widely patent after treatment with post stent systolic gradient of 10 mm Hg. Cordis Exoseal used to close right groin.  Venetia Night. Kathlene Cote, M.D Pager:  [phone]

## 2015-10-31 NOTE — Discharge Instructions (Signed)
Angiogram Angiogram, Care After Refer to this sheet in the next few weeks. These instructions provide you with information about caring for yourself after your procedure. Your health care provider may also give you more specific instructions. Your treatment has been planned according to current medical practices, but problems sometimes occur. Call your health care provider if you have any problems or questions after your procedure. WHAT TO EXPECT AFTER THE PROCEDURE After your procedure, it is typical to have the following:  Bruising at the catheter insertion site that usually fades within 1-2 weeks.  Blood collecting in the tissue (hematoma) that may be painful to the touch. It should usually decrease in size and tenderness within 1-2 weeks. HOME CARE INSTRUCTIONS  Take medicines only as directed by your health care provider.  You may shower 24-48 hours after the procedure or as directed by your health care provider. Remove the bandage (dressing) and gently wash the site with plain soap and water. Pat the area dry with a clean towel. Do not rub the site, because this may cause bleeding.  Do not take baths, swim, or use a hot tub until your health care provider approves.  Check your insertion site every day for redness, swelling, or drainage.  Do not apply powder or lotion to the site.  Do not lift over 10 lb (4.5 kg) for 5 days after your procedure or as directed by your health care provider.  Ask your health care provider when it is okay to:  Return to work or school.  Resume usual physical activities or sports.  Resume sexual activity.  Do not drive home if you are discharged the same day as the procedure. Have someone else drive you.  You may drive 24 hours after the procedure unless otherwise instructed by your health care provider.  Do not operate machinery or power tools for 24 hours after the procedure or as directed by your health care provider.  If your procedure was  done as an outpatient procedure, which means that you went home the same day as your procedure, a responsible adult should be with you for the first 24 hours after you arrive home.  Keep all follow-up visits as directed by your health care provider. This is important. SEEK MEDICAL CARE IF:  You have a fever.  You have chills.  You have increased bleeding from the catheter insertion site. Hold pressure on the site. SEEK IMMEDIATE MEDICAL CARE IF:  You have unusual pain at the catheter insertion site.  You have redness, warmth, or swelling at the catheter insertion site.  You have drainage (other than a small amount of blood on the dressing) from the catheter insertion site.  The catheter insertion site is bleeding, and the bleeding does not stop after 30 minutes of holding steady pressure on the site.  The area near or just beyond the catheter insertion site becomes pale, cool, tingly, or numb.   This information is not intended to replace advice given to you by your health care provider. Make sure you discuss any questions you have with your health care provider.   Document Released: 02/20/2005 Document Revised: 08/25/2014 Document Reviewed: 01/05/2013 Elsevier Interactive Patient Education 2016 [place] After Refer to this sheet in the next few weeks. These instructions provide you with information about caring for yourself after your procedure. Your health care provider may also give you more specific instructions. Your treatment has been planned according to current medical practices, but problems sometimes occur. Call  your health care provider if you have any problems or questions after your procedure. WHAT TO EXPECT AFTER THE PROCEDURE After your procedure, it is typical to have the following:  Bruising at the catheter insertion site that usually fades within 1-2 weeks.  Blood collecting in the tissue (hematoma) that may be painful to the touch. It should  usually decrease in size and tenderness within 1-2 weeks. HOME CARE INSTRUCTIONS  Take medicines only as directed by your health care provider.  You may shower 24-48 hours after the procedure or as directed by your health care provider. Remove the bandage (dressing) and gently wash the site with plain soap and water. Pat the area dry with a clean towel. Do not rub the site, because this may cause bleeding.  Do not take baths, swim, or use a hot tub until your health care provider approves.  Check your insertion site every day for redness, swelling, or drainage.  Do not apply powder or lotion to the site.  Do not lift over 10 lb (4.5 kg) for 5 days after your procedure or as directed by your health care provider.  Ask your health care provider when it is okay to:  Return to work or school.  Resume usual physical activities or sports.  Resume sexual activity.  Do not drive home if you are discharged the same day as the procedure. Have someone else drive you.  You may drive 24 hours after the procedure unless otherwise instructed by your health care provider.  Do not operate machinery or power tools for 24 hours after the procedure or as directed by your health care provider.  If your procedure was done as an outpatient procedure, which means that you went home the same day as your procedure, a responsible adult should be with you for the first 24 hours after you arrive home.  Keep all follow-up visits as directed by your health care provider. This is important. SEEK MEDICAL CARE IF:  You have a fever.  You have chills.  You have increased bleeding from the catheter insertion site. Hold pressure on the site. SEEK IMMEDIATE MEDICAL CARE IF:  You have unusual pain at the catheter insertion site.  You have redness, warmth, or swelling at the catheter insertion site.  You have drainage (other than a small amount of blood on the dressing) from the catheter insertion site.  The  catheter insertion site is bleeding, and the bleeding does not stop after 30 minutes of holding steady pressure on the site.  The area near or just beyond the catheter insertion site becomes pale, cool, tingly, or numb.   This information is not intended to replace advice given to you by your health care provider. Make sure you discuss any questions you have with your health care provider.   Document Released: 02/20/2005 Document Revised: 08/25/2014 Document Reviewed: 01/05/2013 Elsevier Interactive Patient Education [employer].

## 2015-10-31 NOTE — Sedation Documentation (Signed)
6 Fr Exoseal to right

## 2015-10-31 NOTE — Sedation Documentation (Signed)
Right post stenting pressure  148/52 mean 86 Aorta 158/53 mean 90

## 2015-10-31 NOTE — Sedation Documentation (Signed)
Left renal artery pressure  151/57 mean 92 Aorta 148/54 mean 88

## 2015-10-31 NOTE — H&P (Signed)
Chief Complaint: Patient was seen in consultation today for renal arteriogram with possible angioplasty/stent placement at the request of Mattingly,Michael  Referring Physician(s): Mattingly,Michael  Supervising Physician: Aletta Edouard  History of Present Illness: [name] is a 82 y.o. female   Hypertension Refractory to medication MRA 10/15/15: IMPRESSION: 1. Ostial right renal artery stenosis, of possible hemodynamic significance. If there is clinical concern of renovascular hypertension, Consider catheter angiography for further evaluation, as the lesion may be amenable to percutaneous treatment if confirmed to be significant.  Now scheduled for arteriogram and possible angioplasty/stent Bun/Cr 15/1.17  3/14  Past Medical History  Diagnosis Date  . Nodule of right lung     Right upper lobe  . Hyperlipidemia   . Hypertension   . Hypothyroidism   . Renal insufficiency     Chronic  . Arthritis   . CKD (chronic kidney disease), stage III   . Anemia   . Renal vascular disease   . Asthma   . Right renal artery stenosis ([place]) 05/09/2015    Past Surgical History  Procedure Laterality Date  . Thyroidectomy, partial      Allergies: Amlodipine; Isosorbide; Sulfonamide derivatives; and Terazosin  Medications: Prior to Admission medications   Medication Sig Start Date End Date Taking? Authorizing Provider  ALPRAZolam (XANAX) 0.25 MG tablet Take 1 tablet (0.25 mg total) by mouth at bedtime as needed for anxiety. 10/24/15   Hannah Muthersbaugh, PA-C  aspirin EC 81 MG tablet Take 81 mg by mouth every other day.    Historical Provider, MD  atenolol-chlorthalidone (TENORETIC) 50-25 MG tablet Take 1 tablet by mouth daily. 07/24/15   Chipper Herb, MD  atorvastatin (LIPITOR) 20 MG tablet TAKE 1/2 TABLET DAILY 06/30/15   Chipper Herb, MD  calcium-vitamin D (OSCAL WITH D) 500-200 MG-UNIT per tablet Take 1 tablet by mouth daily.      Historical Provider, MD    cloNIDine (CATAPRES) 0.1 MG tablet Take 0.1 mg by mouth 2 (two) times daily.  09/13/14   Historical Provider, MD  ferrous sulfate ([employer] IRON) 325 (65 FE) MG tablet Take 325 mg by mouth 2 (two) times daily with a meal.    Historical Provider, MD  flecainide (TAMBOCOR) 50 MG tablet TAKE 1 TABLET (50 MG TOTAL) BY MOUTH 2 (TWO) TIMES DAILY. 02/05/15   Deboraha Sprang, MD  hydrALAZINE (APRESOLINE) 50 MG tablet Take 50 mg by mouth 2 (two) times daily.     Historical Provider, MD  hydroxypropyl methylcellulose / hypromellose (ISOPTO TEARS / GONIOVISC) 2.5 % ophthalmic solution Place 1 drop into both eyes as needed for dry eyes.    Historical Provider, MD  levothyroxine (SYNTHROID, LEVOTHROID) 75 MCG tablet TAKE 1 TABLET (75 MCG TOTAL) BY MOUTH DAILY. 05/28/15   Chipper Herb, MD  lisinopril (PRINIVIL,ZESTRIL) 40 MG tablet Take 1 tablet (40 mg total) by mouth 2 (two) times daily. 10/17/15   Rexene Alberts, MD  Omega-3 Fatty Acids (FISH OIL) 1200 MG CAPS Take 1,200 mg by mouth daily.    Historical Provider, MD     Family History  Problem Relation Age of Onset  . Thyroid disease Mother   . Stroke Father   . Hypertension Brother     Social History   Social History  . Marital Status: Married    Spouse Name: N/A  . Number of Children: N/A  . Years of Education: N/A   Social History Main Topics  . Smoking status: Never Smoker   .  Smokeless tobacco: None     Comment: Pt denies cigarettes  . Alcohol Use: No  . Drug Use: No  . Sexual Activity: Not Asked   Other Topics Concern  . None   Social History Narrative    Review of Systems: A 12 point ROS discussed and pertinent positives are indicated in the HPI above.  All other systems are negative.  Review of Systems  Constitutional: Negative for fever, activity change, appetite change and fatigue.  Respiratory: Negative for shortness of breath.   Gastrointestinal: Negative for abdominal pain.  Musculoskeletal: Negative for back pain.   Neurological: Negative for weakness.  Psychiatric/Behavioral: Negative for behavioral problems and confusion.    Vital Signs: BP 185/70 mmHg  Temp(Src) 98.1 F (36.7 C) (Oral)  Resp 14  Ht 5\' 6"  (1.676 m)  Wt 125 lb (56.7 kg)  BMI 20.19 kg/m2  SpO2 98%  Physical Exam  Constitutional: She is oriented to person, place, and time. She appears well-nourished.  Cardiovascular: Normal rate, regular rhythm and normal heart sounds.   Pulmonary/Chest: Effort normal and breath sounds normal. She has no wheezes.  Abdominal: Soft. Bowel sounds are normal.  Musculoskeletal: Normal range of motion.  Neurological: She is alert and oriented to person, place, and time.  Skin: Skin is warm and dry.  Psychiatric: She has a normal mood and affect. Her behavior is normal. Judgment and thought content normal.    Mallampati Score:  MD Evaluation Airway: WNL Heart: WNL Abdomen: WNL Chest/ Lungs: WNL ASA  Classification: 3 Mallampati/Airway Score: One  Imaging: Dg Chest 2 View  10/14/2015  CLINICAL DATA:  Acute onset of generalized weakness and high blood pressure. Initial encounter. EXAM: CHEST  2 VIEW COMPARISON:  Chest radiograph performed 02/06/2014 FINDINGS: The lungs are well-aerated and clear. There is no evidence of focal opacification, pleural effusion or pneumothorax. The heart is mildly enlarged. No acute osseous abnormalities are seen. IMPRESSION: Mild cardiomegaly.  Lungs remain grossly clear. Electronically Signed   By: Garald Balding M.D.   On: 10/14/2015 22:04   Ct Head Wo Contrast  10/24/2015  CLINICAL DATA:  Headaches, hypertension, paresthesias, nausea, and vomiting. EXAM: CT HEAD WITHOUT CONTRAST TECHNIQUE: Contiguous axial images were obtained from the base of the skull through the vertex without intravenous contrast. COMPARISON:  10/14/2015 FINDINGS: Mild cerebral atrophy. No ventricular dilatation. Patchy low-attenuation changes in the deep white matter consistent small vessel  ischemia. Probable old focal infarct in the left anterior frontal white matter, unchanged since previous study. No mass effect or midline shift. No abnormal extra-axial fluid collections. Gray-white matter junctions are distinct. Basal cisterns are not effaced. No evidence of acute intracranial hemorrhage. No depressed skull fractures. Visualized paranasal sinuses and mastoid air cells are not opacified. Vascular calcifications. IMPRESSION: No acute intracranial abnormalities. Mild atrophy and small vessel ischemic changes. Electronically Signed   By: Lucienne Capers M.D.   On: 10/24/2015 00:03   Ct Head Wo Contrast  10/14/2015  CLINICAL DATA:  Acute onset of headache.  Initial encounter. EXAM: CT HEAD WITHOUT CONTRAST TECHNIQUE: Contiguous axial images were obtained from the base of the skull through the vertex without intravenous contrast. COMPARISON:  CT of the head performed 04/02/2014, and MRI of the brain performed 04/03/2014 FINDINGS: There is no evidence of acute infarction, mass lesion, or intra- or extra-axial hemorrhage on CT. Scattered periventricular and subcortical white matter change likely reflects small vessel ischemic microangiopathy. The posterior fossa, including the cerebellum, brainstem and fourth ventricle, is within normal limits. The  third and lateral ventricles, and basal ganglia are unremarkable in appearance. The cerebral hemispheres are symmetric in appearance, with normal gray-white differentiation. No mass effect or midline shift is seen. There is no evidence of fracture; visualized osseous structures are unremarkable in appearance. The orbits are within normal limits. The paranasal sinuses and mastoid air cells are well-aerated. No significant soft tissue abnormalities are seen. IMPRESSION: 1. No acute intracranial pathology seen on CT. 2. Mild small vessel ischemic microangiopathy. Electronically Signed   By: Garald Balding M.D.   On: 10/14/2015 22:39   Mr Jodene Nam Abdomen W Wo  Contrast  10/15/2015  CLINICAL DATA:  Increase in bp, weakness, EXAM: MRA ABDOMEN AND PELVIS WITH CONTRAST TECHNIQUE: Multiplanar, multiecho pulse sequences of the abdomen and pelvis were obtained with intravenous contrast. Angiographic images of abdomen and pelvis were obtained using MRA technique with intravenous contrast. CONTRAST:  40mL MULTIHANCE GADOBENATE DIMEGLUMINE 529 MG/ML IV SOLN discussed gfr with Dr.Stahl, gfr=48 today, reduced dose, COMPARISON:  CT 11/09/2014 and previous FINDINGS: Arterial findings: Aorta: Mild tortuosity. Mild atheromatous irregularity. No dissection, aneurysm, or stenosis. Celiac axis:         Patent Superior mesenteric: Patent Left renal:          Single, patent Right renal: Single, with short-segment ostial stenosis of at least moderate severity, patent distally Inferior mesenteric: Diminutive, patent Left iliac:          Tortuous, without aneurysm or stenosis. Right iliac:         Tortuous, without aneurysm or stenosis. Venous findings: Patent hepatic veins, portal vein, SMV, IMV, splenic vein, renal veins, IVC. Review of the MIP images confirms the above findings. Nonvascular findings: No pleural effusion. Unremarkable limited evaluation of liver, spleen, adrenal glands, pancreas, left kidney. There is a 17 mm fluid signal lesion in the interpolar region of the right kidney, otherwise unremarkable. No hydronephrosis. Stomach and visualized portions of small bowel and colon are nondilated. Metallic Susceptibility artifact in the posterior fundus and gastric body lumen may be related to ingested material. No ascites. IMPRESSION: 1. Ostial right renal artery stenosis, of possible hemodynamic significance. If there is clinical concern of renovascular hypertension, Consider catheter angiography for further evaluation, as the lesion may be amenable to percutaneous treatment if confirmed to be significant. Electronically Signed   By: Lucrezia Europe M.D.   On: 10/15/2015 12:00     Labs:  CBC:  Recent Labs  10/16/15 0616 10/17/15 0739 10/23/15 2254 10/23/15 2319 10/30/15 1602  WBC 3.0* 3.8* 3.2*  --  3.1*  HGB 10.9* 11.8* 11.0* 11.2*  --   HCT 32.5* 35.1* 31.5* 33.0* 33.7*  PLT 122* 134* 108*  --  150    COAGS:  Recent Labs  10/23/15 2254  INR 1.05  APTT 38*    BMP:  Recent Labs  10/16/15 0616 10/17/15 0739 10/23/15 2254 10/23/15 2319 10/30/15 1602  NA 138 136 132* 130* 135  K 3.6 3.5 3.5 3.4* 4.3  CL 103 98* 91* 90* 92*  CO2 31 30 28   --  28  GLUCOSE 100* 114* 129* 127* 108*  BUN 15 12 15 17 15   CALCIUM 8.6* 9.0 9.3  --  9.0  CREATININE 1.13* 1.05* 1.26* 1.20* 1.17*  GFRNONAA 45* 49* 39*  --  44*  GFRAA 52* 57* 46*  --  51*    LIVER FUNCTION TESTS:  Recent Labs  06/15/15 1309 10/14/15 2105 10/16/15 0616 10/23/15 2254  BILITOT 0.5 0.6 0.6 0.8  AST 27 33  22 27  ALT 24 32 23 23  ALKPHOS 72 61 53 54  PROT 6.8 7.1 6.1* 6.6  ALBUMIN 4.1 4.1 3.5 3.8    TUMOR MARKERS: No results for input(s): AFPTM, CEA, CA199, CHROMGRNA in the last 8760 hours.  Assessment and Plan:  Hypertension Medication only with minimal relief MRA shows R RAS Now scheduled for renal arteriogram with possible angioplasty/stent placement Risks and Benefits discussed with the patient including, but not limited to bleeding, infection, vascular injury or contrast induced renal failure. All of the patient's questions were answered, patient is agreeable to proceed. Consent signed and in chart.  Thank you for this interesting consult.  I greatly enjoyed meeting JODDIE DIGIORGIO and look forward to participating in their care.  A copy of this report was sent to the requesting provider on this date.  Electronically Signed: Monia Sabal A 10/31/2015, 8:26 AM   I spent a total of  30 Minutes   in face to face in clinical consultation, greater than 50% of which was counseling/coordinating care for renal arteriogram with poss pta/stent

## 2015-10-31 NOTE — Sedation Documentation (Signed)
Right renal artery pressure 77/45 mean 58 Aorta 124/45 mean 72

## 2015-11-01 ENCOUNTER — Telehealth: Payer: Self-pay | Admitting: Family Medicine

## 2015-11-01 NOTE — Telephone Encounter (Signed)
Advised pt she can have any blood work that [place] wants drawn that day of appt. Pt voiced understanding.

## 2015-11-07 ENCOUNTER — Encounter: Payer: Self-pay | Admitting: Family Medicine

## 2015-11-07 ENCOUNTER — Ambulatory Visit (INDEPENDENT_AMBULATORY_CARE_PROVIDER_SITE_OTHER): Payer: Medicare Other | Admitting: Family Medicine

## 2015-11-07 VITALS — BP 179/71 | HR 113 | Temp 97.7°F | Ht 66.0 in | Wt 124.0 lb

## 2015-11-07 DIAGNOSIS — I10 Essential (primary) hypertension: Secondary | ICD-10-CM

## 2015-11-07 DIAGNOSIS — Z1211 Encounter for screening for malignant neoplasm of colon: Secondary | ICD-10-CM | POA: Diagnosis not present

## 2015-11-07 DIAGNOSIS — E559 Vitamin D deficiency, unspecified: Secondary | ICD-10-CM | POA: Diagnosis not present

## 2015-11-07 DIAGNOSIS — E785 Hyperlipidemia, unspecified: Secondary | ICD-10-CM | POA: Diagnosis not present

## 2015-11-07 DIAGNOSIS — I701 Atherosclerosis of renal artery: Secondary | ICD-10-CM | POA: Diagnosis not present

## 2015-11-07 DIAGNOSIS — N183 Chronic kidney disease, stage 3 unspecified: Secondary | ICD-10-CM

## 2015-11-07 DIAGNOSIS — N189 Chronic kidney disease, unspecified: Secondary | ICD-10-CM | POA: Diagnosis not present

## 2015-11-07 DIAGNOSIS — D696 Thrombocytopenia, unspecified: Secondary | ICD-10-CM

## 2015-11-07 DIAGNOSIS — I480 Paroxysmal atrial fibrillation: Secondary | ICD-10-CM

## 2015-11-07 DIAGNOSIS — D61818 Other pancytopenia: Secondary | ICD-10-CM

## 2015-11-07 NOTE — Progress Notes (Signed)
Subjective:    Patient ID: [name], female    DOB: [birth year], 82 y.o.   MRN: [id number]  HPI Pt here for follow up and management of chronic medical problems which includes hypertension, CKD, and hyperlipidemia. She is taking medications regularly.The patient has a history of renal artery stenosis and recently had a stent placed in the right renal artery because of a 95% blockage. She is following closely with the nephrologist and he had taken her off of several medicines and is retired trading these medications to get her blood pressure and heart rate under better control. She does not appear quite as anxious today. She is due to return an FOBT and will get lab work done here today and we will make sure that her nephrologist gets a copy of this. The patient denies any chest pain or shortness of breath. She has no complaints with her GI tract and no blood in the stool or black tarry bowel movements. She is passing her water without problems. She seems calmer, but is somewhat anxious about why the blood pressure is not coming down and her heart rate is still elevated. I reassured her that was some time this would probably get better.     Patient Active Problem List   Diagnosis Date Noted  . Renal vascular disease 10/31/2015  . Nodule of right lung 10/31/2015  . Asthma without status asthmaticus 10/31/2015  . Essential hypertension, malignant   . Hypothyroidism 10/15/2015  . Pancytopenia ([place]) 10/15/2015  . Hyponatremia 10/14/2015  . Thrombocytopenia ([place]) 06/16/2015  . Right renal artery stenosis ([place]) 05/09/2015  . Chronic diastolic heart failure ([place]) 04/03/2014  . Acute diastolic heart failure ([place]) 04/03/2014  . Hypertensive urgency 04/02/2014  . Headache 04/02/2014  . CKD (chronic kidney disease) stage 3, GFR 30-59 ml/min 04/02/2014  . Paroxysmal atrial fibrillation ([place]) 01/25/2014  . Hypokalemia 07/19/2013  . Anemia 07/19/2013  . Bilateral lower extremity edema 06/30/2013   . Hypertension with fluid overload 06/30/2013  . Need for prophylactic vaccination and inoculation against influenza 05/31/2013  . Pedal edema 05/10/2013  . Varicose veins of lower extremities with other complications 62/56/3893  . ABNORMAL STRESS ELECTROCARDIOGRAM 01/23/2010  . HYPERLIPIDEMIA 12/18/2009  . Essential hypertension 12/18/2009  . RENAL INSUFFICIENCY, CHRONIC 12/18/2009  . ARTHRITIS 12/18/2009   Outpatient Encounter Prescriptions as of 11/07/2015  Medication Sig  . ALPRAZolam (XANAX) 0.25 MG tablet Take 1 tablet (0.25 mg total) by mouth at bedtime as needed for anxiety.  Marland Kitchen aspirin EC 81 MG tablet Take 81 mg by mouth every other day.  Marland Kitchen atorvastatin (LIPITOR) 20 MG tablet TAKE 1/2 TABLET DAILY  . calcium-vitamin D (OSCAL WITH D) 500-200 MG-UNIT per tablet Take 1 tablet by mouth daily.    . cloNIDine (CATAPRES) 0.1 MG tablet Take 0.1 mg by mouth 2 (two) times daily.   . ferrous sulfate ([employer] IRON) 325 (65 FE) MG tablet Take 325 mg by mouth 2 (two) times daily with a meal.  . flecainide (TAMBOCOR) 50 MG tablet TAKE 1 TABLET (50 MG TOTAL) BY MOUTH 2 (TWO) TIMES DAILY.  . hydrALAZINE (APRESOLINE) 50 MG tablet Take 50 mg by mouth 2 (two) times daily.   . hydroxypropyl methylcellulose / hypromellose (ISOPTO TEARS / GONIOVISC) 2.5 % ophthalmic solution Place 1 drop into both eyes as needed for dry eyes.  Marland Kitchen levothyroxine (SYNTHROID, LEVOTHROID) 75 MCG tablet TAKE 1 TABLET (75 MCG TOTAL) BY MOUTH DAILY.  Marland Kitchen lisinopril (PRINIVIL,ZESTRIL) 40 MG tablet Take 1 tablet (40  mg total) by mouth 2 (two) times daily. (Patient taking differently: Take 40 mg by mouth daily. )  . Omega-3 Fatty Acids (FISH OIL) 1200 MG CAPS Take 1,200 mg by mouth daily.  . [DISCONTINUED] atenolol-chlorthalidone (TENORETIC) 50-25 MG tablet Take 1 tablet by mouth daily.   No facility-administered encounter medications on file as of 11/07/2015.      Review of Systems  Constitutional: Negative.   HENT: Negative.     Eyes: Negative.   Respiratory: Negative.   Cardiovascular: Negative.        Elevated HR with med change  Gastrointestinal: Negative.   Endocrine: Negative.   Genitourinary: Negative.   Musculoskeletal: Negative.   Skin: Negative.   Allergic/Immunologic: Negative.   Neurological: Negative.   Hematological: Negative.   Psychiatric/Behavioral: Negative.        Objective:   Physical Exam  Constitutional: She is oriented to person, place, and time. She appears well-developed and well-nourished. She appears distressed.  HENT:  Head: Normocephalic and atraumatic.  Right Ear: External ear normal.  Left Ear: External ear normal.  Nose: Nose normal.  Mouth/Throat: Oropharynx is clear and moist.  Eyes: Conjunctivae and EOM are normal. Pupils are equal, round, and reactive to light. Right eye exhibits no discharge. Left eye exhibits no discharge. No scleral icterus.  Neck: Normal range of motion. Neck supple. No thyromegaly present.  Cardiovascular: Normal rate, normal heart sounds and intact distal pulses.   No murmur heard. At 108/m  and regular  Pulmonary/Chest: Effort normal and breath sounds normal. No respiratory distress. She has no wheezes. She has no rales. She exhibits no tenderness.  Abdominal: Soft. Bowel sounds are normal. She exhibits no mass. There is no tenderness. There is no rebound and no guarding.  Slightly tender in the right groin where the recent procedure was performed and there is bruising at the site.  Musculoskeletal: Normal range of motion. She exhibits no edema.  Lymphadenopathy:    She has no cervical adenopathy.  Neurological: She is alert and oriented to person, place, and time. She has normal reflexes. No cranial nerve deficit.  Skin: Skin is warm and dry. No rash noted.  Bruising at the right groin status post renal artery stenosis stent placement  Psychiatric: She has a normal mood and affect. Her behavior is normal. Judgment and thought content normal.   Nursing note and vitals reviewed.  BP 179/71 mmHg  Pulse 113  Temp(Src) 97.7 F (36.5 C) (Oral)  Ht '5\' 6"'  (1.676 m)  Wt 124 lb (56.246 kg)  BMI 20.02 kg/m2  Repeat blood pressure right arm sitting 176/76 to get her cuff      Assessment & Plan:  1. Essential hypertension -The blood pressure remains elevated following placement of stent and right renal artery and the patient will continue to follow-up with the nephrologist. - BMP8+EGFR - CBC with Differential/Platelet - Hepatic function panel  2. HLD (hyperlipidemia) -She will continue with atorvastatin pending results of lab work - CBC with Differential/Platelet - NMR, lipoprofile  3. Vitamin D deficiency -Continue with calcium and vitamin D replacement. He results of lab work - CBC with Differential/Platelet - VITAMIN D 25 Hydroxy (Vit-D Deficiency, Fractures)  4. Thrombocytopenia (HCC) - CBC with Differential/Platelet  5. CKD (chronic kidney disease), unspecified stage -Continue follow-up with nephrology as planned - BMP8+EGFR - CBC with Differential/Platelet  6. Special screening for malignant neoplasms, colon - Fecal occult blood, imunochemical; Future  7. Right renal artery stenosis (HCC) -Follow up with nephrology as planned  8. Paroxysmal atrial fibrillation (HCC) -The patient is normal sinus rhythm with sinus tachycardia today.  9. Pancytopenia ([place]) -CBC is pending  10. Hyperlipidemia -Continue atorvastatin and as aggressive therapeutic lifestyle changes as possible  11. CKD (chronic kidney disease) stage 3, GFR 30-59 ml/min -Follow-up with nephrology  Patient Instructions                       Medicare Annual Wellness Visit  [facility] and the medical providers at [facility] strive to bring you the best medical care.  In doing so we not only want to address your current medical conditions and concerns but also to detect new conditions early and prevent illness, disease  and health-related problems.    Medicare offers a yearly Wellness Visit which allows our clinical staff to assess your need for preventative services including immunizations, lifestyle education, counseling to decrease risk of preventable diseases and screening for fall risk and other medical concerns.    This visit is provided free of charge (no copay) for all Medicare recipients. The clinical pharmacists at [facility] have begun to conduct these Wellness Visits which will also include a thorough review of all your medications.    As you primary medical provider recommend that you make an appointment for your Annual Wellness Visit if you have not done so already this year.  You may set up this appointment before you leave today or you may call back ([phone]) and schedule an appointment.  Please make sure when you call that you mention that you are scheduling your Annual Wellness Visit with the clinical pharmacist so that the appointment may be made for the proper length of time.     Continue current medications. Continue good therapeutic lifestyle changes which include good diet and exercise. Fall precautions discussed with patient. If an FOBT was given today- please return it to our front desk. If you are over 58 years old - you may need Prevnar 59 or the adult Pneumonia vaccine.  **Flu shots are available--- please call and schedule a FLU-CLINIC appointment**  After your visit with [place] today you will receive a survey in the mail or online from [employer] regarding your care with [place]. Please take a moment to fill this out. Your feedback is very important to [place] as you can help [place] better understand your patient needs as well as improve your experience and satisfaction. WE CARE ABOUT YOU!!!   The patient should follow-up with a nephrologist as planned and call him with the readings that she is getting at home so he can further adjust her medication as needed We will make  sure that we send Dr. Mercy Moore a copy of the lab work is done today She will take a copy of her blood pressure readings with her to visit him at the next visit   Arrie Senate MD

## 2015-11-07 NOTE — Patient Instructions (Addendum)
Medicare Annual Wellness Visit  [facility] and the medical providers at [facility] strive to bring you the best medical care.  In doing so we not only want to address your current medical conditions and concerns but also to detect new conditions early and prevent illness, disease and health-related problems.    Medicare offers a yearly Wellness Visit which allows our clinical staff to assess your need for preventative services including immunizations, lifestyle education, counseling to decrease risk of preventable diseases and screening for fall risk and other medical concerns.    This visit is provided free of charge (no copay) for all Medicare recipients. The clinical pharmacists at [facility] have begun to conduct these Wellness Visits which will also include a thorough review of all your medications.    As you primary medical provider recommend that you make an appointment for your Annual Wellness Visit if you have not done so already this year.  You may set up this appointment before you leave today or you may call back [phone] and schedule an appointment.  Please make sure when you call that you mention that you are scheduling your Annual Wellness Visit with the clinical pharmacist so that the appointment may be made for the proper length of time.     Continue current medications. Continue good therapeutic lifestyle changes which include good diet and exercise. Fall precautions discussed with patient. If an FOBT was given today- please return it to our front desk. If you are over 69 years old - you may need Prevnar 89 or the adult Pneumonia vaccine.  **Flu shots are available--- please call and schedule a FLU-CLINIC appointment**  After your visit with [place] today you will receive a survey in the mail or online from [employer] regarding your care with [place]. Please take a moment to fill this out. Your feedback is very  important to [place] as you can help [place] better understand your patient needs as well as improve your experience and satisfaction. WE CARE ABOUT YOU!!!   The patient should follow-up with a nephrologist as planned and call him with the readings that she is getting at home so he can further adjust her medication as needed We will make sure that we send Dr. Mercy Moore a copy of the lab work is done today She will take a copy of her blood pressure readings with her to visit him at the next visit

## 2015-11-08 LAB — CBC WITH DIFFERENTIAL/PLATELET
Basophils Absolute: 0 10*3/uL (ref 0.0–0.2)
Basos: 0 %
EOS (ABSOLUTE): 0 10*3/uL (ref 0.0–0.4)
EOS: 2 %
HEMATOCRIT: 35 % (ref 34.0–46.6)
Hemoglobin: 11.8 g/dL (ref 11.1–15.9)
Immature Grans (Abs): 0 10*3/uL (ref 0.0–0.1)
Immature Granulocytes: 0 %
LYMPHS ABS: 0.9 10*3/uL (ref 0.7–3.1)
Lymphs: 39 %
MCH: 28.9 pg (ref 26.6–33.0)
MCHC: 33.7 g/dL (ref 31.5–35.7)
MCV: 86 fL (ref 79–97)
MONOS ABS: 0.3 10*3/uL (ref 0.1–0.9)
Monocytes: 11 %
Neutrophils Absolute: 1.1 10*3/uL — ABNORMAL LOW (ref 1.4–7.0)
Neutrophils: 48 %
Platelets: 132 10*3/uL — ABNORMAL LOW (ref 150–379)
RBC: 4.08 x10E6/uL (ref 3.77–5.28)
RDW: 14.5 % (ref 12.3–15.4)
WBC: 2.4 10*3/uL — AB (ref 3.4–10.8)

## 2015-11-08 LAB — HEPATIC FUNCTION PANEL
ALBUMIN: 4.4 g/dL (ref 3.5–4.8)
ALT: 16 IU/L (ref 0–32)
AST: 19 IU/L (ref 0–40)
Alkaline Phosphatase: 68 IU/L (ref 39–117)
Bilirubin Total: 0.7 mg/dL (ref 0.0–1.2)
Bilirubin, Direct: 0.2 mg/dL (ref 0.00–0.40)
Total Protein: 6.9 g/dL (ref 6.0–8.5)

## 2015-11-08 LAB — NMR, LIPOPROFILE
Cholesterol: 157 mg/dL (ref 100–199)
HDL CHOLESTEROL BY NMR: 62 mg/dL (ref >39)
HDL PARTICLE NUMBER: 29.2 umol/L — AB (ref >=30.5)
LDL Particle Number: 732 nmol/L (ref <1000)
LDL Size: 20.7 nm (ref >20.5)
LDL-C: 78 mg/dL (ref 0–99)
LP-IR Score: 26 (ref <=45)
Small LDL Particle Number: 245 nmol/L (ref <=527)
Triglycerides by NMR: 87 mg/dL (ref 0–149)

## 2015-11-08 LAB — BMP8+EGFR
BUN / CREAT RATIO: 11 (ref 11–26)
BUN: 15 mg/dL (ref 8–27)
CHLORIDE: 94 mmol/L — AB (ref 96–106)
CO2: 28 mmol/L (ref 18–29)
CREATININE: 1.32 mg/dL — AB (ref 0.57–1.00)
Calcium: 9.4 mg/dL (ref 8.7–10.3)
GFR calc Af Amer: 44 mL/min/{1.73_m2} — ABNORMAL LOW (ref >59)
GFR calc non Af Amer: 38 mL/min/{1.73_m2} — ABNORMAL LOW (ref >59)
GLUCOSE: 99 mg/dL (ref 65–99)
Potassium: 4.1 mmol/L (ref 3.5–5.2)
SODIUM: 136 mmol/L (ref 134–144)

## 2015-11-08 LAB — VITAMIN D 25 HYDROXY (VIT D DEFICIENCY, FRACTURES): VIT D 25 HYDROXY: 63.9 ng/mL (ref 30.0–100.0)

## 2015-11-09 ENCOUNTER — Telehealth: Payer: Self-pay | Admitting: Family Medicine

## 2015-11-09 NOTE — Addendum Note (Signed)
Addended by: Thana Ates on: 11/09/2015 09:44 AM   Modules accepted: Orders

## 2015-11-09 NOTE — Telephone Encounter (Signed)
Patient aware of results.

## 2015-11-12 ENCOUNTER — Ambulatory Visit (HOSPITAL_COMMUNITY): Payer: Medicare Other

## 2015-11-15 ENCOUNTER — Other Ambulatory Visit: Payer: Medicare Other

## 2015-11-15 DIAGNOSIS — D696 Thrombocytopenia, unspecified: Secondary | ICD-10-CM

## 2015-11-16 LAB — CBC WITH DIFFERENTIAL/PLATELET
BASOS ABS: 0 10*3/uL (ref 0.0–0.2)
Basos: 0 %
EOS (ABSOLUTE): 0 10*3/uL (ref 0.0–0.4)
Eos: 1 %
Hematocrit: 34.2 % (ref 34.0–46.6)
Hemoglobin: 11.3 g/dL (ref 11.1–15.9)
Immature Grans (Abs): 0 10*3/uL (ref 0.0–0.1)
Immature Granulocytes: 0 %
LYMPHS ABS: 0.7 10*3/uL (ref 0.7–3.1)
Lymphs: 27 %
MCH: 28.5 pg (ref 26.6–33.0)
MCHC: 33 g/dL (ref 31.5–35.7)
MCV: 86 fL (ref 79–97)
MONOCYTES: 12 %
MONOS ABS: 0.3 10*3/uL (ref 0.1–0.9)
NEUTROS ABS: 1.6 10*3/uL (ref 1.4–7.0)
Neutrophils: 60 %
PLATELETS: 143 10*3/uL — AB (ref 150–379)
RBC: 3.96 x10E6/uL (ref 3.77–5.28)
RDW: 14.5 % (ref 12.3–15.4)
WBC: 2.6 10*3/uL — AB (ref 3.4–10.8)

## 2015-11-23 ENCOUNTER — Other Ambulatory Visit: Payer: Self-pay | Admitting: Family Medicine

## 2015-11-26 DIAGNOSIS — N183 Chronic kidney disease, stage 3 (moderate): Secondary | ICD-10-CM | POA: Diagnosis not present

## 2015-11-27 ENCOUNTER — Ambulatory Visit (INDEPENDENT_AMBULATORY_CARE_PROVIDER_SITE_OTHER): Payer: Medicare Other | Admitting: Family Medicine

## 2015-11-27 ENCOUNTER — Ambulatory Visit: Payer: Medicare Other | Admitting: Family Medicine

## 2015-11-27 ENCOUNTER — Encounter: Payer: Self-pay | Admitting: Family Medicine

## 2015-11-27 VITALS — BP 128/62 | HR 49 | Temp 97.8°F | Ht 66.0 in | Wt 122.0 lb

## 2015-11-27 DIAGNOSIS — I10 Essential (primary) hypertension: Secondary | ICD-10-CM

## 2015-11-27 DIAGNOSIS — R05 Cough: Secondary | ICD-10-CM

## 2015-11-27 DIAGNOSIS — J209 Acute bronchitis, unspecified: Secondary | ICD-10-CM | POA: Diagnosis not present

## 2015-11-27 DIAGNOSIS — N183 Chronic kidney disease, stage 3 unspecified: Secondary | ICD-10-CM

## 2015-11-27 DIAGNOSIS — R059 Cough, unspecified: Secondary | ICD-10-CM

## 2015-11-27 DIAGNOSIS — J9801 Acute bronchospasm: Secondary | ICD-10-CM

## 2015-11-27 MED ORDER — METHYLPREDNISOLONE ACETATE 80 MG/ML IJ SUSP
60.0000 mg | Freq: Once | INTRAMUSCULAR | Status: AC
  Administered 2015-11-27: 60 mg via INTRAMUSCULAR

## 2015-11-27 MED ORDER — PREDNISONE 10 MG PO TABS: ORAL_TABLET | ORAL | Status: DC

## 2015-11-27 MED ORDER — CEFDINIR 300 MG PO CAPS: 300.0000 mg | ORAL_CAPSULE | Freq: Two times a day (BID) | ORAL | Status: DC

## 2015-11-27 NOTE — Patient Instructions (Addendum)
Take antibiotic as directed Use Flonase nasal spray 1 spray each nostril at bedtime Take prednisone as directed Use nasal saline during the day in each nostril Take Mucinex 1 tablet twice daily for cough and congestion with a large glass of water Wear respiratory protection Drink plenty of fluids and stay well hydrated We will call you with lab work results when these become available Take a copy of these results with you to your next nephrology visit The radiology technician we'll call you when radiology is up and running again to get your chest x-ray Use the breo inhaler as directed 1 puff once daily and rinse mouth after using this

## 2015-11-27 NOTE — Progress Notes (Signed)
Subjective:    Patient ID: [name], female    DOB: 04-Feb-1934, 82 y.o.   MRN: [id number]  HPI Patient here today for cough and congestion that started last Thursday. The patient today complains of this cough and congestion for almost a week. She says it may be a little better today and the congestion is white in color and she has not had any fever. She denies ever having any status asthmaticus as noted in the record. She has had some bouts of bronchitis in the past. She recently had a stent for renal artery stenosis and plans to see the nephrologist again next week. She brings in blood pressures for review and I will be scanned into the record. All repeat of her blood pressure today manually I got 140/62 in the left arm sitting.   Patient Active Problem List   Diagnosis Date Noted  . Renal vascular disease 10/31/2015  . Nodule of right lung 10/31/2015  . Asthma without status asthmaticus 10/31/2015  . Essential hypertension, malignant   . Hypothyroidism 10/15/2015  . Pancytopenia ([place]) 10/15/2015  . Hyponatremia 10/14/2015  . Thrombocytopenia ([place]) 06/16/2015  . Right renal artery stenosis ([place]) 05/09/2015  . Chronic diastolic heart failure ([place]) 04/03/2014  . Acute diastolic heart failure ([place]) 04/03/2014  . Hypertensive urgency 04/02/2014  . Headache 04/02/2014  . CKD (chronic kidney disease) stage 3, GFR 30-59 ml/min 04/02/2014  . Paroxysmal atrial fibrillation ([place]) 01/25/2014  . Hypokalemia 07/19/2013  . Anemia 07/19/2013  . Bilateral lower extremity edema 06/30/2013  . Hypertension with fluid overload 06/30/2013  . Need for prophylactic vaccination and inoculation against influenza 05/31/2013  . Pedal edema 05/10/2013  . Varicose veins of lower extremities with other complications 48/54/6270  . ABNORMAL STRESS ELECTROCARDIOGRAM 01/23/2010  . Hyperlipidemia 12/18/2009  . Essential hypertension 12/18/2009  . RENAL INSUFFICIENCY, CHRONIC 12/18/2009  . ARTHRITIS  12/18/2009   Outpatient Encounter Prescriptions as of 11/27/2015  Medication Sig  . ALPRAZolam (XANAX) 0.25 MG tablet Take 1 tablet (0.25 mg total) by mouth at bedtime as needed for anxiety.  Marland Kitchen aspirin EC 81 MG tablet Take 81 mg by mouth every other day.  Marland Kitchen atorvastatin (LIPITOR) 20 MG tablet TAKE 1/2 TABLET DAILY  . calcium-vitamin D (OSCAL WITH D) 500-200 MG-UNIT per tablet Take 1 tablet by mouth daily.    . cloNIDine (CATAPRES) 0.1 MG tablet Take 0.1 mg by mouth 2 (two) times daily.   . ferrous sulfate ([employer] IRON) 325 (65 FE) MG tablet Take 325 mg by mouth 2 (two) times daily with a meal.  . flecainide (TAMBOCOR) 50 MG tablet TAKE 1 TABLET (50 MG TOTAL) BY MOUTH 2 (TWO) TIMES DAILY.  . hydrALAZINE (APRESOLINE) 50 MG tablet Take 50 mg by mouth 2 (two) times daily.   . hydroxypropyl methylcellulose / hypromellose (ISOPTO TEARS / GONIOVISC) 2.5 % ophthalmic solution Place 1 drop into both eyes as needed for dry eyes.  Marland Kitchen levothyroxine (SYNTHROID, LEVOTHROID) 75 MCG tablet TAKE 1 TABLET (75 MCG TOTAL) BY MOUTH DAILY.  Marland Kitchen lisinopril (PRINIVIL,ZESTRIL) 40 MG tablet Take 1 tablet (40 mg total) by mouth 2 (two) times daily. (Patient taking differently: Take 40 mg by mouth daily. )  . Omega-3 Fatty Acids (FISH OIL) 1200 MG CAPS Take 1,200 mg by mouth daily.   No facility-administered encounter medications on file as of 11/27/2015.     Review of Systems  Constitutional: Negative.  Negative for fever.  HENT: Positive for congestion (white in color).   Eyes:  Negative.   Respiratory: Positive for cough.   Cardiovascular: Negative.   Gastrointestinal: Negative.   Endocrine: Negative.   Genitourinary: Negative.   Musculoskeletal: Negative.   Skin: Negative.   Allergic/Immunologic: Negative.   Neurological: Negative.   Hematological: Negative.   Psychiatric/Behavioral: Negative.        Objective:   Physical Exam  Constitutional: She is oriented to person, place, and time. She appears  well-developed and well-nourished. No distress.  HENT:  Head: Normocephalic and atraumatic.  Nose: Nose normal.  Mouth/Throat: Oropharynx is clear and moist. No oropharyngeal exudate.  Eyes: Conjunctivae and EOM are normal. Pupils are equal, round, and reactive to light. Right eye exhibits no discharge. Left eye exhibits no discharge. No scleral icterus.  Neck: Normal range of motion. Neck supple. No thyromegaly present.  Cardiovascular: Normal rate, regular rhythm and normal heart sounds.   No murmur heard. Pulmonary/Chest: Effort normal and breath sounds normal. No respiratory distress. She has no wheezes. She has no rales.  With deep breathing the breath sounds are clear anteriorly and posteriorly. With coughing she has a tight congested cough.  Musculoskeletal: Normal range of motion. She exhibits no edema.  Lymphadenopathy:    She has no cervical adenopathy.  Neurological: She is alert and oriented to person, place, and time. She has normal reflexes.  Skin: Skin is warm and dry. No rash noted.  Psychiatric: She has a normal mood and affect. Her behavior is normal. Judgment and thought content normal.  Nursing note and vitals reviewed.  BP 128/62 mmHg  Pulse 49  Temp(Src) 97.8 F (36.6 C) (Oral)  Ht '5\' 6"'  (1.676 m)  Wt 122 lb (55.339 kg)  BMI 19.70 kg/m2  [facility] reading (PRIMARY) by  Dr. Brunilda Payor x-ray, unable to do today but will call back when x-ray is up and running again.                                        Assessment & Plan:  1. Essential hypertension, malignant -The blood pressure is improved today from previous readings. -The patient will follow-up with the nephrologist as planned next week  2. CKD (chronic kidney disease) stage 3, GFR 30-59 ml/min -Follow-up with nephrology as planned  3. Essential hypertension  4. Cough -Take Mucinex, drink plenty of fluids, use Flonase nasal spray 1 spray each nostril at bedtime and use nasal saline during the day -Take  antibiotic as directed, drink plenty of fluids and take prednisone tapering dose - CBC with Differential/Platelet - DG Chest 2 View; Future - BMP8+EGFR - methylPREDNISolone acetate (DEPO-MEDROL) injection 60 mg; Inject 0.75 mLs (60 mg total) into the muscle once.  5. Bronchospasm -Prednisone tapering dose and use Brio inhaler 1 puff once daily --2 sample packs were given.  6. Acute bronchitis, unspecified organism -Take antibiotic as directed  Meds ordered this encounter  Medications  . predniSONE (DELTASONE) 10 MG tablet    Sig: Take 1 tab QID x 2 days, then 1 tab TID x 2 days, then 1 tab BID x 2 days, then 1 tab QD x 2 days.    Dispense:  20 tablet    Refill:  0  . methylPREDNISolone acetate (DEPO-MEDROL) injection 60 mg    Sig:   . cefdinir (OMNICEF) 300 MG capsule    Sig: Take 1 capsule (300 mg total) by mouth 2 (two) times daily. 1 po BID  Dispense:  20 capsule    Refill:  0   Patient Instructions  Take antibiotic as directed Use Flonase nasal spray 1 spray each nostril at bedtime Take prednisone as directed Use nasal saline during the day in each nostril Take Mucinex 1 tablet twice daily for cough and congestion with a large glass of water Wear respiratory protection Drink plenty of fluids and stay well hydrated We will call you with lab work results when these become available Take a copy of these results with you to your next nephrology visit The radiology technician we'll call you when radiology is up and running again to get your chest x-ray Use the breo inhaler as directed 1 puff once daily and rinse mouth after using this   Arrie Senate MD

## 2015-11-28 ENCOUNTER — Ambulatory Visit (INDEPENDENT_AMBULATORY_CARE_PROVIDER_SITE_OTHER): Payer: Medicare Other

## 2015-11-28 DIAGNOSIS — R059 Cough, unspecified: Secondary | ICD-10-CM

## 2015-11-28 DIAGNOSIS — R05 Cough: Secondary | ICD-10-CM | POA: Diagnosis not present

## 2015-11-28 LAB — CBC WITH DIFFERENTIAL/PLATELET
BASOS ABS: 0 10*3/uL (ref 0.0–0.2)
Basos: 0 %
EOS (ABSOLUTE): 0 10*3/uL (ref 0.0–0.4)
Eos: 0 %
HEMOGLOBIN: 11 g/dL — AB (ref 11.1–15.9)
Hematocrit: 32.6 % — ABNORMAL LOW (ref 34.0–46.6)
IMMATURE GRANS (ABS): 0 10*3/uL (ref 0.0–0.1)
IMMATURE GRANULOCYTES: 0 %
Lymphocytes Absolute: 0.7 10*3/uL (ref 0.7–3.1)
Lymphs: 28 %
MCH: 28.8 pg (ref 26.6–33.0)
MCHC: 33.7 g/dL (ref 31.5–35.7)
MCV: 85 fL (ref 79–97)
Monocytes Absolute: 0.5 10*3/uL (ref 0.1–0.9)
Monocytes: 19 %
NEUTROS ABS: 1.3 10*3/uL — AB (ref 1.4–7.0)
Neutrophils: 53 %
Platelets: 129 10*3/uL — ABNORMAL LOW (ref 150–379)
RBC: 3.82 x10E6/uL (ref 3.77–5.28)
RDW: 14.2 % (ref 12.3–15.4)
WBC: 2.6 10*3/uL — AB (ref 3.4–10.8)

## 2015-11-28 LAB — BMP8+EGFR
BUN/Creatinine Ratio: 13 (ref 12–28)
BUN: 17 mg/dL (ref 8–27)
CALCIUM: 8.8 mg/dL (ref 8.7–10.3)
CO2: 28 mmol/L (ref 18–29)
CREATININE: 1.3 mg/dL — AB (ref 0.57–1.00)
Chloride: 88 mmol/L — ABNORMAL LOW (ref 96–106)
GFR, EST AFRICAN AMERICAN: 45 mL/min/{1.73_m2} — AB (ref >59)
GFR, EST NON AFRICAN AMERICAN: 39 mL/min/{1.73_m2} — AB (ref >59)
Glucose: 102 mg/dL — ABNORMAL HIGH (ref 65–99)
POTASSIUM: 3.9 mmol/L (ref 3.5–5.2)
Sodium: 130 mmol/L — ABNORMAL LOW (ref 134–144)

## 2015-12-04 ENCOUNTER — Telehealth: Payer: Self-pay | Admitting: Family Medicine

## 2015-12-04 ENCOUNTER — Ambulatory Visit
Admission: RE | Admit: 2015-12-04 | Discharge: 2015-12-04 | Disposition: A | Discharge status: Home / Self Care | Payer: Medicare Other | Source: Ambulatory Visit | Attending: Radiology | Admitting: Radiology

## 2015-12-04 DIAGNOSIS — N183 Chronic kidney disease, stage 3 unspecified: Secondary | ICD-10-CM

## 2015-12-04 DIAGNOSIS — N289 Disorder of kidney and ureter, unspecified: Secondary | ICD-10-CM | POA: Diagnosis not present

## 2015-12-04 NOTE — Progress Notes (Signed)
Patient ID: [name], female   DOB: 1933/12/23, 82 y.o.   MRN: [id number]   Referring Physician(s): Mattingly,Michael  Chief Complaint: The patient is seen in follow up today s/p renal arteriogram with placement of right renal artery stent on 10/31/15  History of present illness: Mrs. [name] is a 82 year old female with history of renal insufficiency/CKD and worsening refractory hypertension who was referred to the interventional radiology service for renal arteriogram with possible intervention on 10/31/15. Renal arteriogram at the time revealed a significant proximal right renal artery stenosis of approximately 80-90 % with associated significant pressure gradient. The stenosis was treated with placement of a 6 mm x 15 mm intravascular stent by Dr. Kathlene Cote. There was no evidence of left renal artery stenosis. Postprocedure the patient was discharged from the hospital same day without immediate complications. She presents today for routine outpatient follow-up. Since the above procedure the patient has done fairly well. She was seen recently by Dr. Laurance Flatten (primary physicain) for cough and congestion and placed on steroids and antibiotics with some improvement. Chest x-ray at the time was negative for acute process. She continues to have some minimal coughing but currently denies headache, fever, chest pain, lightheadedness, worsening dyspnea, abdominal/back pain, nausea ,vomiting or abnormal bleeding. She does note some occasional incomplete bladder emptying but denies hematuria or dysuria. Systolic blood pressure readings obtained at home are in the 150-160 range per patient. Latest creatinine on 11/27/15 was 1.3 (1.32). Today's BP readings in the clinic include left arm of 223/87 and in the right arm 228/116. She continues to take clonidine, hydralazine, and lisinopril.   Past Medical History  Diagnosis Date  . Nodule of right lung     Right upper lobe  . Hyperlipidemia   . Hypertension   .  Hypothyroidism   . Renal insufficiency     Chronic  . Arthritis   . CKD (chronic kidney disease), stage III   . Anemia   . Renal vascular disease   . Asthma   . Right renal artery stenosis ([place]) 05/09/2015    Past Surgical History  Procedure Laterality Date  . Thyroidectomy, partial      Allergies: Amlodipine; Isosorbide nitrate; Sulfonamide derivatives; and Terazosin  Medications: Prior to Admission medications   Medication Sig Start Date End Date Taking? Authorizing Provider  ALPRAZolam (XANAX) 0.25 MG tablet Take 1 tablet (0.25 mg total) by mouth at bedtime as needed for anxiety. 10/24/15   Hannah Muthersbaugh, PA-C  aspirin EC 81 MG tablet Take 81 mg by mouth every other day.    Historical Provider, MD  atorvastatin (LIPITOR) 20 MG tablet TAKE 1/2 TABLET DAILY 06/30/15   Chipper Herb, MD  calcium-vitamin D (OSCAL WITH D) 500-200 MG-UNIT per tablet Take 1 tablet by mouth daily.      Historical Provider, MD  cefdinir (OMNICEF) 300 MG capsule Take 1 capsule (300 mg total) by mouth 2 (two) times daily. 1 po BID 11/27/15   Chipper Herb, MD  cloNIDine (CATAPRES) 0.1 MG tablet Take 0.1 mg by mouth 2 (two) times daily.  09/13/14   Historical Provider, MD  ferrous sulfate (CVS IRON) 325 (65 FE) MG tablet Take 325 mg by mouth 2 (two) times daily with a meal.    Historical Provider, MD  flecainide (TAMBOCOR) 50 MG tablet TAKE 1 TABLET (50 MG TOTAL) BY MOUTH 2 (TWO) TIMES DAILY. 02/05/15   Deboraha Sprang, MD  hydrALAZINE (APRESOLINE) 50 MG tablet Take 50 mg by mouth 2 (two) times  daily.     Historical Provider, MD  hydroxypropyl methylcellulose / hypromellose (ISOPTO TEARS / GONIOVISC) 2.5 % ophthalmic solution Place 1 drop into both eyes as needed for dry eyes.    Historical Provider, MD  levothyroxine (SYNTHROID, LEVOTHROID) 75 MCG tablet TAKE 1 TABLET (75 MCG TOTAL) BY MOUTH DAILY. 11/26/15   Chipper Herb, MD  lisinopril (PRINIVIL,ZESTRIL) 40 MG tablet Take 1 tablet (40 mg total) by mouth  2 (two) times daily. Patient taking differently: Take 40 mg by mouth daily.  10/17/15   Rexene Alberts, MD  Omega-3 Fatty Acids (FISH OIL) 1200 MG CAPS Take 1,200 mg by mouth daily.    Historical Provider, MD  predniSONE (DELTASONE) 10 MG tablet Take 1 tab QID x 2 days, then 1 tab TID x 2 days, then 1 tab BID x 2 days, then 1 tab QD x 2 days. 11/27/15   Chipper Herb, MD     Family History  Problem Relation Age of Onset  . Thyroid disease Mother   . Stroke Father   . Hypertension Brother     Social History   Social History  . Marital Status: Married    Spouse Name: N/A  . Number of Children: N/A  . Years of Education: N/A   Social History Main Topics  . Smoking status: Never Smoker   . Smokeless tobacco: Not on file     Comment: Pt denies cigarettes  . Alcohol Use: No  . Drug Use: No  . Sexual Activity: Not on file   Other Topics Concern  . Not on file   Social History Narrative     Vital Signs: BP 223/87 mmHg  Pulse 56  Temp(Src) 97.9 F (36.6 C) (Oral)  Resp 14  Ht 5\' 6"  (1.676 m)  Wt 124 lb (56.246 kg)  BMI 20.02 kg/m2  SpO2 97%  Physical Exam patient awake, alert. Chest clear to auscultation bilaterally. Heart with slightly bradycardic but regular rhythm. Abdomen soft, positive bowel sounds, nontender. Puncture site right common femoral artery soft, nontender, no definite hematoma. Lower extremities with no significant edema and intact distal pulses.  Imaging: No results found.  Labs:  CBC:  Recent Labs  10/16/15 0616 10/17/15 0739 10/23/15 2254 10/23/15 2319 10/30/15 1602 11/07/15 1017 11/15/15 0957 11/27/15 1212  WBC 3.0* 3.8* 3.2*  --  3.1* 2.4* 2.6* 2.6*  HGB 10.9* 11.8* 11.0* 11.2*  --   --   --   --   HCT 32.5* 35.1* 31.5* 33.0* 33.7* 35.0 34.2 32.6*  PLT 122* 134* 108*  --  150 132* 143* 129*    COAGS:  Recent Labs  10/23/15 2254 10/31/15 0813  INR 1.05 1.15  APTT 38* 42*    BMP:  Recent Labs  10/30/15 1602 10/31/15 0813  11/07/15 1017 11/27/15 1224  NA 135 136 136 130*  K 4.3 3.8 4.1 3.9  CL 92* 98* 94* 88*  CO2 28 29 28 28   GLUCOSE 108* 119* 99 102*  BUN 15 12 15 17   CALCIUM 9.0 9.5 9.4 8.8  CREATININE 1.17* 1.24* 1.32* 1.30*  GFRNONAA 44* 40* 38* 39*  GFRAA 51* 47* 44* 45*    LIVER FUNCTION TESTS:  Recent Labs  10/14/15 2105 10/16/15 0616 10/23/15 2254 11/07/15 1017  BILITOT 0.6 0.6 0.8 0.7  AST 33 22 27 19   ALT 32 23 23 16   ALKPHOS 61 53 54 68  PROT 7.1 6.1* 6.6 6.9  ALBUMIN 4.1 3.5 3.8 4.4    Assessment:  Patient with history of renal insufficiency and refractory hypertension, status post renal arteriogram with placement of a right renal artery stent on 10/31/15 with no immediate complications. Creatinine currently 1.3. Although systolic blood pressure readings today are the 200 range, per patient readings at home are in the 150-160 range. Recommend the patient remain on baby aspirin daily and encouraged continuation of a walking exercise regimen. She is scheduled to meet with Dr. Mercy Moore on 4/21.  Should an upper trend continue to occur in blood pressure readings despite medical therapy could initially consider follow-up renal duplex study .   Signed: D. Rowe Robert 12/04/2015, 11:35 AM   Please refer to Dr. Margaretmary Dys attestation of this note for management and plan.

## 2015-12-04 NOTE — Telephone Encounter (Signed)
Patient called stating that she was to call today to let Dr. Laurance Flatten know how she was feeling.  Patient states that she is feeling better. Patient still has cough and congestion but it is getting better.  Patient has finished taking prednisone and will finish antibiotic Thursday 12/06/15.  Patient is also still taking the mucinex.

## 2015-12-07 ENCOUNTER — Telehealth: Payer: Self-pay | Admitting: Family Medicine

## 2015-12-07 DIAGNOSIS — I129 Hypertensive chronic kidney disease with stage 1 through stage 4 chronic kidney disease, or unspecified chronic kidney disease: Secondary | ICD-10-CM | POA: Diagnosis not present

## 2015-12-07 DIAGNOSIS — N183 Chronic kidney disease, stage 3 (moderate): Secondary | ICD-10-CM | POA: Diagnosis not present

## 2015-12-07 DIAGNOSIS — N2889 Other specified disorders of kidney and ureter: Secondary | ICD-10-CM | POA: Diagnosis not present

## 2015-12-07 DIAGNOSIS — E785 Hyperlipidemia, unspecified: Secondary | ICD-10-CM | POA: Diagnosis not present

## 2015-12-11 ENCOUNTER — Encounter: Payer: Self-pay | Admitting: Family Medicine

## 2015-12-11 ENCOUNTER — Ambulatory Visit (INDEPENDENT_AMBULATORY_CARE_PROVIDER_SITE_OTHER): Payer: Medicare Other | Admitting: Family Medicine

## 2015-12-11 VITALS — BP 146/56 | HR 49 | Temp 97.4°F | Ht 66.0 in | Wt 123.0 lb

## 2015-12-11 DIAGNOSIS — J40 Bronchitis, not specified as acute or chronic: Secondary | ICD-10-CM

## 2015-12-11 DIAGNOSIS — J209 Acute bronchitis, unspecified: Secondary | ICD-10-CM

## 2015-12-11 NOTE — Patient Instructions (Signed)
Continue with breo inhaler 1 puff once a day and rinse mouth after using-at least for 2 more weeks and possibly for 4 more weeks and then as needed after that. Continue with Mucinex one twice daily with a large glass of water Continue with nasal saline

## 2015-12-11 NOTE — Progress Notes (Signed)
Subjective:    Patient ID: [name], female    DOB: 1934-02-23, 82 y.o.   MRN: [id number]  HPI Patient here today for 2 week follow up on cough and congestion. She is still having a slight cough, but is better. The patient recently finished a course of Omnicef and prednisone and is still on the Mucinex. She continues to have problems with her blood pressure has been followed by the nephrologist. A second reading today was 146/56 and this is better than in the past. The patient brings in blood pressures for review and they're finally getting better following the renal artery stenting. A copy these will be scanned into the record. She is also feeling better with her bronchitis and bronchospasm is still using her inhaler and taking the Mucinex and has finished the prednisone and the antibiotic. She seems to be much more relaxed today and much better spirits than in the past.      Patient Active Problem List   Diagnosis Date Noted  . Renal vascular disease 10/31/2015  . Nodule of right lung 10/31/2015  . Asthma without status asthmaticus 10/31/2015  . Essential hypertension, malignant   . Hypothyroidism 10/15/2015  . Pancytopenia ([place]) 10/15/2015  . Hyponatremia 10/14/2015  . Thrombocytopenia ([place]) 06/16/2015  . Right renal artery stenosis ([place]) 05/09/2015  . Chronic diastolic heart failure ([place]) 04/03/2014  . Acute diastolic heart failure ([place]) 04/03/2014  . Hypertensive urgency 04/02/2014  . Headache 04/02/2014  . CKD (chronic kidney disease) stage 3, GFR 30-59 ml/min 04/02/2014  . Paroxysmal atrial fibrillation ([place]) 01/25/2014  . Hypokalemia 07/19/2013  . Anemia 07/19/2013  . Bilateral lower extremity edema 06/30/2013  . Hypertension with fluid overload 06/30/2013  . Need for prophylactic vaccination and inoculation against influenza 05/31/2013  . Pedal edema 05/10/2013  . Varicose veins of lower extremities with other complications 99991111  . ABNORMAL STRESS  ELECTROCARDIOGRAM 01/23/2010  . Hyperlipidemia 12/18/2009  . Essential hypertension 12/18/2009  . RENAL INSUFFICIENCY, CHRONIC 12/18/2009  . ARTHRITIS 12/18/2009   Outpatient Encounter Prescriptions as of 12/11/2015  Medication Sig  . aspirin EC 81 MG tablet Take 81 mg by mouth every other day.  Marland Kitchen atenolol-chlorthalidone (TENORETIC) 50-25 MG tablet Take 1 tablet by mouth daily.  Marland Kitchen atorvastatin (LIPITOR) 20 MG tablet TAKE 1/2 TABLET DAILY  . calcium-vitamin D (OSCAL WITH D) 500-200 MG-UNIT per tablet Take 1 tablet by mouth daily.    . cloNIDine (CATAPRES) 0.1 MG tablet Take 0.1 mg by mouth 2 (two) times daily.   . ferrous sulfate (CVS IRON) 325 (65 FE) MG tablet Take 325 mg by mouth 2 (two) times daily with a meal.  . flecainide (TAMBOCOR) 50 MG tablet TAKE 1 TABLET (50 MG TOTAL) BY MOUTH 2 (TWO) TIMES DAILY.  . hydrALAZINE (APRESOLINE) 50 MG tablet Take 50 mg by mouth 2 (two) times daily.   . hydroxypropyl methylcellulose / hypromellose (ISOPTO TEARS / GONIOVISC) 2.5 % ophthalmic solution Place 1 drop into both eyes as needed for dry eyes.  Marland Kitchen levothyroxine (SYNTHROID, LEVOTHROID) 75 MCG tablet TAKE 1 TABLET (75 MCG TOTAL) BY MOUTH DAILY.  Marland Kitchen lisinopril (PRINIVIL,ZESTRIL) 40 MG tablet Take 40 mg by mouth daily.  . Omega-3 Fatty Acids (FISH OIL) 1200 MG CAPS Take 1,200 mg by mouth daily.  . [DISCONTINUED] lisinopril (PRINIVIL,ZESTRIL) 40 MG tablet Take 1 tablet (40 mg total) by mouth 2 (two) times daily. (Patient taking differently: Take 40 mg by mouth daily. )  . ALPRAZolam (XANAX) 0.25 MG tablet Take  1 tablet (0.25 mg total) by mouth at bedtime as needed for anxiety. (Patient not taking: Reported on 12/11/2015)  . [DISCONTINUED] cefdinir (OMNICEF) 300 MG capsule Take 1 capsule (300 mg total) by mouth 2 (two) times daily. 1 po BID  . [DISCONTINUED] predniSONE (DELTASONE) 10 MG tablet Take 1 tab QID x 2 days, then 1 tab TID x 2 days, then 1 tab BID x 2 days, then 1 tab QD x 2 days.   No  facility-administered encounter medications on file as of 12/11/2015.     Review of Systems  Constitutional: Negative.   HENT: Negative.   Eyes: Negative.   Respiratory: Positive for cough (slight).   Cardiovascular: Negative.   Gastrointestinal: Negative.   Endocrine: Negative.   Genitourinary: Negative.   Musculoskeletal: Negative.   Skin: Negative.   Allergic/Immunologic: Negative.   Neurological: Negative.   Hematological: Negative.   Psychiatric/Behavioral: Negative.        Objective:   Physical Exam  Constitutional: She is oriented to person, place, and time. She appears well-developed and well-nourished. No distress.  HENT:  Head: Normocephalic and atraumatic.  Right Ear: External ear normal.  Left Ear: External ear normal.  Nose: Nose normal.  Mouth/Throat: Oropharynx is clear and moist. No oropharyngeal exudate.  Eyes: Conjunctivae and EOM are normal. Pupils are equal, round, and reactive to light. Right eye exhibits no discharge. Left eye exhibits no discharge. No scleral icterus.  Neck: Normal range of motion. Neck supple. No thyromegaly present.  Cardiovascular: Normal rate, regular rhythm and normal heart sounds.   No murmur heard. At 60/m  Pulmonary/Chest: Effort normal and breath sounds normal. She has no wheezes. She has no rales.  Slightly dry cough  Musculoskeletal: Normal range of motion. She exhibits no edema.  Lymphadenopathy:    She has no cervical adenopathy.  Neurological: She is alert and oriented to person, place, and time.  Skin: Skin is warm and dry. No rash noted.  Psychiatric: She has a normal mood and affect. Her behavior is normal. Judgment and thought content normal.  Nursing note and vitals reviewed.  BP 158/63 mmHg  Pulse 51  Temp(Src) 97.4 F (36.3 C) (Oral)  Ht 5\' 6"  (1.676 m)  Wt 123 lb (55.792 kg)  BMI 19.86 kg/m2  Home blood pressure readings will be scanned into the record please see these. The majority were excellent with  the readings in the 120s over the  60s to 70s.      Assessment & Plan:  1. Bronchitis with bronchospasm -Continue with breo inhaler and rinse mouth after using -Continue with Mucinex one twice daily with a large glass of water -Continue both inhaler and Mucinex for at least 4 more weeks and then as needed after that  2. Blood pressure is greatly improved and the patient seems to be much calmer and her affect.  Patient Instructions  Continue with breo inhaler 1 puff once a day and rinse mouth after using-at least for 2 more weeks and possibly for 4 more weeks and then as needed after that. Continue with Mucinex one twice daily with a large glass of water Continue with nasal saline    Arrie Senate MD

## 2015-12-14 NOTE — Telephone Encounter (Signed)
Several call have been left for [facility] nurse this encounter will be closed.

## 2015-12-24 DIAGNOSIS — L570 Actinic keratosis: Secondary | ICD-10-CM | POA: Diagnosis not present

## 2016-01-07 ENCOUNTER — Ambulatory Visit (INDEPENDENT_AMBULATORY_CARE_PROVIDER_SITE_OTHER): Payer: Medicare Other | Admitting: Family Medicine

## 2016-01-07 ENCOUNTER — Encounter: Payer: Self-pay | Admitting: Family Medicine

## 2016-01-07 VITALS — BP 154/53 | HR 60 | Temp 97.0°F | Ht 66.0 in | Wt 126.2 lb

## 2016-01-07 DIAGNOSIS — E785 Hyperlipidemia, unspecified: Secondary | ICD-10-CM | POA: Diagnosis not present

## 2016-01-07 DIAGNOSIS — N183 Chronic kidney disease, stage 3 (moderate): Secondary | ICD-10-CM | POA: Diagnosis not present

## 2016-01-07 DIAGNOSIS — N2889 Other specified disorders of kidney and ureter: Secondary | ICD-10-CM | POA: Diagnosis not present

## 2016-01-07 DIAGNOSIS — I129 Hypertensive chronic kidney disease with stage 1 through stage 4 chronic kidney disease, or unspecified chronic kidney disease: Secondary | ICD-10-CM | POA: Diagnosis not present

## 2016-01-07 DIAGNOSIS — S81812A Laceration without foreign body, left lower leg, initial encounter: Secondary | ICD-10-CM

## 2016-01-07 MED ORDER — MUPIROCIN CALCIUM 2 % EX CREA: 1.0000 "application " | TOPICAL_CREAM | Freq: Every day | CUTANEOUS | Status: DC

## 2016-01-07 NOTE — Progress Notes (Signed)
Subjective:  Patient ID: [name], female    DOB: 1934-05-14  Age: 82 y.o. MRN: [id number]  CC: Laceration   HPI HJORDIS HOWLEY presents for Stepping over a short shrub and caught her shin on it about 6 days ago. She applied what is likely Steri-Strips based on her description, but she called them stitches that she applied at home like tape. Yesterday her wound was swollen inferiorly down to the ankle although it was not red. It was painful for the first 4 days but now she is just aware of its presence. She says the swelling she noted before is now gone as well. The wound is located at the left shin in the mid shaft of the leg laterally.   History Khady has a past medical history of Nodule of right lung; Hyperlipidemia; Hypertension; Hypothyroidism; Renal insufficiency; Arthritis; CKD (chronic kidney disease), stage III; Anemia; Renal vascular disease; Asthma; and Right renal artery stenosis ([place]) (05/09/2015).   She has past surgical history that includes Thyroidectomy, partial.   Her family history includes Hypertension in her brother; Stroke in her father; Thyroid disease in her mother.She reports that she has never smoked. She does not have any smokeless tobacco history on file. She reports that she does not drink alcohol or use illicit drugs.    ROS Review of Systems  Constitutional: Negative for fever, activity change and appetite change.  Neurological: Negative for weakness and numbness.    Objective:  BP 154/53 mmHg  Pulse 60  Temp(Src) 97 F (36.1 C) (Oral)  Ht 5\' 6"  (1.676 m)  Wt 126 lb 3.2 oz (57.244 kg)  BMI 20.38 kg/m2  SpO2 97%  BP Readings from Last 3 Encounters:  01/07/16 154/53  12/11/15 146/56  12/04/15 223/87    Wt Readings from Last 3 Encounters:  01/07/16 126 lb 3.2 oz (57.244 kg)  12/11/15 123 lb (55.792 kg)  12/04/15 124 lb (56.246 kg)     Physical Exam  Constitutional: She is oriented to person, place, and time. She appears  well-developed and well-nourished. No distress.  Musculoskeletal: Normal range of motion. She exhibits no edema or tenderness.  Neurological: She is alert and oriented to person, place, and time. No cranial nerve deficit.  Skin: Skin is warm and dry. No rash noted. No erythema.  There are 2 lacerations on the mid shaft of the left leg just lateral to the tibial prominence. These measure about 6 mm and are 1.2 cm apart. There is no sign of redness or swelling. They are crusted without any fluctuance or exudate. The crust is small and linear with a deep black coloration.     Lab Results  Component Value Date   WBC 2.6* 11/27/2015   HGB 11.2* 10/23/2015   HCT 32.6* 11/27/2015   PLT 129* 11/27/2015   GLUCOSE 102* 11/27/2015   CHOL 157 11/07/2015   TRIG 87 11/07/2015   HDL 62 11/07/2015   LDLCALC 76 03/14/2014   ALT 16 11/07/2015   AST 19 11/07/2015   NA 130* 11/27/2015   K 3.9 11/27/2015   CL 88* 11/27/2015   CREATININE 1.30* 11/27/2015   BUN 17 11/27/2015   CO2 28 11/27/2015   TSH 3.109 10/15/2015   INR 1.15 10/31/2015   HGBA1C 5.0 02/22/2015    No results found.  Assessment & Plan:   Icelynn was seen today for laceration.  Diagnoses and all orders for this visit:  Laceration of leg, left, initial encounter  Other orders -  mupirocin cream (BACTROBAN) 2 %; Apply 1 application topically daily.    Wound care reviewed  I am having Ms. Altemus start on mupirocin cream. I am also having her maintain her calcium-vitamin D, Fish Oil, aspirin EC, cloNIDine, flecainide, ferrous sulfate, hydrALAZINE, atorvastatin, hydroxypropyl methylcellulose / hypromellose, ALPRAZolam, levothyroxine, atenolol-chlorthalidone, and lisinopril.  Meds ordered this encounter  Medications  . mupirocin cream (BACTROBAN) 2 %    Sig: Apply 1 application topically daily.    Dispense:  15 g    Refill:  0     Follow-up: Return if symptoms worsen or fail to improve.  Claretta Fraise, M.D.

## 2016-01-15 ENCOUNTER — Encounter: Payer: Self-pay | Admitting: Family Medicine

## 2016-01-15 ENCOUNTER — Ambulatory Visit (INDEPENDENT_AMBULATORY_CARE_PROVIDER_SITE_OTHER): Payer: Medicare Other | Admitting: Family Medicine

## 2016-01-15 VITALS — BP 196/62 | HR 54 | Temp 97.8°F | Ht 66.0 in | Wt 124.0 lb

## 2016-01-15 DIAGNOSIS — S91002A Unspecified open wound, left ankle, initial encounter: Secondary | ICD-10-CM

## 2016-01-15 DIAGNOSIS — I10 Essential (primary) hypertension: Secondary | ICD-10-CM

## 2016-01-15 DIAGNOSIS — S81002A Unspecified open wound, left knee, initial encounter: Secondary | ICD-10-CM

## 2016-01-15 DIAGNOSIS — S81802A Unspecified open wound, left lower leg, initial encounter: Secondary | ICD-10-CM | POA: Diagnosis not present

## 2016-01-15 NOTE — Progress Notes (Signed)
Subjective:    Patient ID: [name], female    DOB: 01/19/1934, 82 y.o.   MRN: [id number]  HPI Patient here today for skin tear on left lower leg. This happened Friday night by hitting it on a chair. Patient applied Steri-Strips and used ice pack. She takes aspirin 81 mg. She has had a problem lately with skin tears and hitting her legs and arms on objects. She wonders if the Steri-Strips need to be removed and the wound cleaned since her was some bleeding that occurred after she applied the Steri-Strips.     Patient Active Problem List   Diagnosis Date Noted  . Renal vascular disease 10/31/2015  . Nodule of right lung 10/31/2015  . Asthma without status asthmaticus 10/31/2015  . Essential hypertension, malignant   . Hypothyroidism 10/15/2015  . Pancytopenia ([place]) 10/15/2015  . Hyponatremia 10/14/2015  . Thrombocytopenia ([place]) 06/16/2015  . Right renal artery stenosis ([place]) 05/09/2015  . Chronic diastolic heart failure ([place]) 04/03/2014  . Acute diastolic heart failure ([place]) 04/03/2014  . Hypertensive urgency 04/02/2014  . Headache 04/02/2014  . CKD (chronic kidney disease) stage 3, GFR 30-59 ml/min 04/02/2014  . Paroxysmal atrial fibrillation ([place]) 01/25/2014  . Hypokalemia 07/19/2013  . Anemia 07/19/2013  . Bilateral lower extremity edema 06/30/2013  . Hypertension with fluid overload 06/30/2013  . Need for prophylactic vaccination and inoculation against influenza 05/31/2013  . Pedal edema 05/10/2013  . Varicose veins of lower extremities with other complications 99991111  . ABNORMAL STRESS ELECTROCARDIOGRAM 01/23/2010  . Hyperlipidemia 12/18/2009  . Essential hypertension 12/18/2009  . RENAL INSUFFICIENCY, CHRONIC 12/18/2009  . ARTHRITIS 12/18/2009   Outpatient Encounter Prescriptions as of 01/15/2016  Medication Sig  . ALPRAZolam (XANAX) 0.25 MG tablet Take 1 tablet (0.25 mg total) by mouth at bedtime as needed for anxiety.  Marland Kitchen aspirin EC 81 MG tablet Take 81  mg by mouth every other day.  Marland Kitchen atenolol-chlorthalidone (TENORETIC) 50-25 MG tablet Take 1 tablet by mouth daily.  Marland Kitchen atorvastatin (LIPITOR) 20 MG tablet TAKE 1/2 TABLET DAILY  . calcium-vitamin D (OSCAL WITH D) 500-200 MG-UNIT per tablet Take 1 tablet by mouth daily.    . cloNIDine (CATAPRES) 0.1 MG tablet Take 0.1 mg by mouth 2 (two) times daily.   . ferrous sulfate (CVS IRON) 325 (65 FE) MG tablet Take 325 mg by mouth 2 (two) times daily with a meal.  . flecainide (TAMBOCOR) 50 MG tablet TAKE 1 TABLET (50 MG TOTAL) BY MOUTH 2 (TWO) TIMES DAILY.  . hydrALAZINE (APRESOLINE) 50 MG tablet Take 50 mg by mouth 2 (two) times daily.   . hydroxypropyl methylcellulose / hypromellose (ISOPTO TEARS / GONIOVISC) 2.5 % ophthalmic solution Place 1 drop into both eyes as needed for dry eyes.  Marland Kitchen levothyroxine (SYNTHROID, LEVOTHROID) 75 MCG tablet TAKE 1 TABLET (75 MCG TOTAL) BY MOUTH DAILY.  Marland Kitchen lisinopril (PRINIVIL,ZESTRIL) 40 MG tablet Take 40 mg by mouth daily.  . mupirocin cream (BACTROBAN) 2 % Apply 1 application topically daily.  . Omega-3 Fatty Acids (FISH OIL) 1200 MG CAPS Take 1,200 mg by mouth daily.   No facility-administered encounter medications on file as of 01/15/2016.      Review of Systems  Constitutional: Negative.   HENT: Negative.   Eyes: Negative.   Respiratory: Negative.   Cardiovascular: Negative.   Gastrointestinal: Negative.   Endocrine: Negative.   Genitourinary: Negative.   Musculoskeletal: Negative.   Skin: Positive for wound (left lower leg).  Allergic/Immunologic: Negative.   Neurological:  Negative.   Hematological: Negative.   Psychiatric/Behavioral: Negative.        Objective:   Physical Exam  Constitutional: She appears well-developed and well-nourished.  Skin:  Steri-Strips are intact. There is some old blood underneath the Steri-Strips but there is been no further bleeding. I think I would leave the Steri-Strips intact clean around the wound. Watch for any  sign of redness or infection and let the Steri-Strips fall off without removing them. I would try to keep the wound as dry as possible.   BP 196/62 mmHg  Pulse 54  Temp(Src) 97.8 F (36.6 C) (Oral)  Ht 5\' 6"  (1.676 m)  Wt 124 lb (56.246 kg)  BMI 20.02 kg/m2        Assessment & Plan:  1. Open wound of knee, leg (except thigh), and ankle, complicated, left, initial encounter As noted above I would leave the Steri-Strips intact and let them fall off naturally. There is no sign of infection.   2. Essential hypertension Patient's blood pressure is concerning. She thinks is related to her having to hurry to get here today. She did have a stent apparently placed in renal artery recently and pressures of been well controlled. She will monitor it at home and take clonidine as needed  Wardell Honour MD

## 2016-01-17 DIAGNOSIS — S301XXA Contusion of abdominal wall, initial encounter: Secondary | ICD-10-CM | POA: Diagnosis not present

## 2016-01-17 DIAGNOSIS — T148 Other injury of unspecified body region: Secondary | ICD-10-CM | POA: Diagnosis not present

## 2016-01-18 ENCOUNTER — Other Ambulatory Visit: Payer: Self-pay | Admitting: Internal Medicine

## 2016-01-21 ENCOUNTER — Ambulatory Visit (INDEPENDENT_AMBULATORY_CARE_PROVIDER_SITE_OTHER): Payer: Medicare Other | Admitting: Physician Assistant

## 2016-01-21 ENCOUNTER — Encounter: Payer: Self-pay | Admitting: Physician Assistant

## 2016-01-21 VITALS — BP 160/58 | HR 53 | Temp 97.1°F | Ht 66.0 in | Wt 127.2 lb

## 2016-01-21 DIAGNOSIS — S80922A Unspecified superficial injury of left lower leg, initial encounter: Secondary | ICD-10-CM | POA: Diagnosis not present

## 2016-01-21 DIAGNOSIS — T148XXA Other injury of unspecified body region, initial encounter: Secondary | ICD-10-CM

## 2016-01-21 NOTE — Progress Notes (Signed)
Subjective:     Patient ID: [name], female   DOB: Sep 17, 1933, 82 y.o.   MRN: [id number]  HPI Pt with hx of hematoma and skin tears in the past Now with trauma to the L ant shin area She has noticed a blister Still with swelling and sl pain  Review of Systems Sl redness and pain No drainage from the area    Objective:   Physical Exam Heal skin tear with multiple steri strips in place to the L mid shin area New hematoma just superior to the skin tear Minimal erythema and no edema No surrounding induration noted No TTP Area cleansed with Betadine and small puncture made with sterile needle Serous drainage removed Dressing placed     Assessment:     1. Hematoma        Plan:     S/S of infection reviewed Keep area clean and dry Nl course reviewed F/U prn

## 2016-01-21 NOTE — Patient Instructions (Signed)
Hematoma  A hematoma is a collection of blood under the skin, in an organ, in a body space, in a joint space, or in other tissue. The blood can clot to form a lump that you can see and feel. The lump is often firm and may sometimes become sore and tender. Most hematomas get better in a few days to weeks. However, some hematomas may be serious and require medical care. Hematomas can range in size from very small to very large.  CAUSES   A hematoma can be caused by a blunt or penetrating injury. It can also be caused by spontaneous leakage from a blood vessel under the skin. Spontaneous leakage from a blood vessel is more likely to occur in older people, especially those taking blood thinners. Sometimes, a hematoma can develop after certain medical procedures.  SIGNS AND SYMPTOMS   · A firm lump on the body.  · Possible pain and tenderness in the area.  · Bruising. Blue, dark blue, purple-red, or yellowish skin may appear at the site of the hematoma if the hematoma is close to the surface of the skin.  For hematomas in deeper tissues or body spaces, the signs and symptoms may be subtle. For example, an intra-abdominal hematoma may cause abdominal pain, weakness, fainting, and shortness of breath. An intracranial hematoma may cause a headache or symptoms such as weakness, trouble speaking, or a change in consciousness.  DIAGNOSIS   A hematoma can usually be diagnosed based on your medical history and a physical exam. Imaging tests may be needed if your health care provider suspects a hematoma in deeper tissues or body spaces, such as the abdomen, head, or chest. These tests may include ultrasonography or a CT scan.   TREATMENT   Hematomas usually go away on their own over time. Rarely does the blood need to be drained out of the body. Large hematomas or those that may affect vital organs will sometimes need surgical drainage or monitoring.  HOME CARE INSTRUCTIONS   · Apply ice to the injured area:      Put ice in a  plastic bag.      Place a towel between your skin and the bag.      Leave the ice on for 20 minutes, 2-3 times a day for the first 1 to 2 days.    · After the first 2 days, switch to using warm compresses on the hematoma.    · Elevate the injured area to help decrease pain and swelling. Wrapping the area with an elastic bandage may also be helpful. Compression helps to reduce swelling and promotes shrinking of the hematoma. Make sure the bandage is not wrapped too tight.    · If your hematoma is on a lower extremity and is painful, crutches may be helpful for a couple days.    · Only take over-the-counter or prescription medicines as directed by your health care provider.  SEEK IMMEDIATE MEDICAL CARE IF:   · You have increasing pain, or your pain is not controlled with medicine.    · You have a fever.    · You have worsening swelling or discoloration.    · Your skin over the hematoma breaks or starts bleeding.    · Your hematoma is in your chest or abdomen and you have weakness, shortness of breath, or a change in consciousness.  · Your hematoma is on your scalp (caused by a fall or injury) and you have a worsening headache or a change in alertness or consciousness.  MAKE SURE YOU:   ·   Understand these instructions.  · Will watch your condition.  · Will get help right away if you are not doing well or get worse.     This information is not intended to replace advice given to you by your health care provider. Make sure you discuss any questions you have with your health care provider.     Document Released: 03/18/2004 Document Revised: 04/06/2013 Document Reviewed: 01/12/2013  Elsevier Interactive Patient Education ©2016 Elsevier Inc.

## 2016-01-26 DIAGNOSIS — Z1231 Encounter for screening mammogram for malignant neoplasm of breast: Secondary | ICD-10-CM | POA: Diagnosis not present

## 2016-01-26 DIAGNOSIS — Z803 Family history of malignant neoplasm of breast: Secondary | ICD-10-CM | POA: Diagnosis not present

## 2016-02-05 ENCOUNTER — Other Ambulatory Visit: Payer: Self-pay | Admitting: Physician Assistant

## 2016-02-05 DIAGNOSIS — D485 Neoplasm of uncertain behavior of skin: Secondary | ICD-10-CM | POA: Diagnosis not present

## 2016-02-05 DIAGNOSIS — L309 Dermatitis, unspecified: Secondary | ICD-10-CM | POA: Diagnosis not present

## 2016-02-05 DIAGNOSIS — L570 Actinic keratosis: Secondary | ICD-10-CM | POA: Diagnosis not present

## 2016-02-05 DIAGNOSIS — D1801 Hemangioma of skin and subcutaneous tissue: Secondary | ICD-10-CM | POA: Diagnosis not present

## 2016-03-10 ENCOUNTER — Other Ambulatory Visit: Payer: Medicare Other

## 2016-03-10 DIAGNOSIS — E785 Hyperlipidemia, unspecified: Secondary | ICD-10-CM

## 2016-03-10 DIAGNOSIS — N183 Chronic kidney disease, stage 3 unspecified: Secondary | ICD-10-CM

## 2016-03-10 DIAGNOSIS — D696 Thrombocytopenia, unspecified: Secondary | ICD-10-CM

## 2016-03-10 DIAGNOSIS — I10 Essential (primary) hypertension: Secondary | ICD-10-CM | POA: Diagnosis not present

## 2016-03-10 DIAGNOSIS — E559 Vitamin D deficiency, unspecified: Secondary | ICD-10-CM

## 2016-03-11 LAB — BMP8+EGFR
BUN / CREAT RATIO: 17 (ref 12–28)
BUN: 25 mg/dL (ref 8–27)
CALCIUM: 8.8 mg/dL (ref 8.7–10.3)
CO2: 26 mmol/L (ref 18–29)
CREATININE: 1.43 mg/dL — AB (ref 0.57–1.00)
Chloride: 97 mmol/L (ref 96–106)
GFR calc Af Amer: 40 mL/min/{1.73_m2} — ABNORMAL LOW (ref >59)
GFR calc non Af Amer: 35 mL/min/{1.73_m2} — ABNORMAL LOW (ref >59)
GLUCOSE: 96 mg/dL (ref 65–99)
POTASSIUM: 3.6 mmol/L (ref 3.5–5.2)
Sodium: 139 mmol/L (ref 134–144)

## 2016-03-11 LAB — CBC WITH DIFFERENTIAL/PLATELET
BASOS: 1 %
Basophils Absolute: 0 10*3/uL (ref 0.0–0.2)
EOS (ABSOLUTE): 0.1 10*3/uL (ref 0.0–0.4)
EOS: 2 %
HEMOGLOBIN: 11.6 g/dL (ref 11.1–15.9)
Hematocrit: 35.7 % (ref 34.0–46.6)
IMMATURE GRANS (ABS): 0 10*3/uL (ref 0.0–0.1)
Immature Granulocytes: 0 %
LYMPHS: 35 %
Lymphocytes Absolute: 0.9 10*3/uL (ref 0.7–3.1)
MCH: 28.6 pg (ref 26.6–33.0)
MCHC: 32.5 g/dL (ref 31.5–35.7)
MCV: 88 fL (ref 79–97)
Monocytes Absolute: 0.2 10*3/uL (ref 0.1–0.9)
Monocytes: 8 %
NEUTROS ABS: 1.4 10*3/uL (ref 1.4–7.0)
Neutrophils: 54 %
PLATELETS: 114 10*3/uL — AB (ref 150–379)
RBC: 4.06 x10E6/uL (ref 3.77–5.28)
RDW: 13.8 % (ref 12.3–15.4)
WBC: 2.5 10*3/uL — CL (ref 3.4–10.8)

## 2016-03-11 LAB — HEPATIC FUNCTION PANEL
ALBUMIN: 4 g/dL (ref 3.5–4.7)
ALK PHOS: 62 IU/L (ref 39–117)
ALT: 16 IU/L (ref 0–32)
AST: 24 IU/L (ref 0–40)
BILIRUBIN TOTAL: 0.4 mg/dL (ref 0.0–1.2)
Bilirubin, Direct: 0.14 mg/dL (ref 0.00–0.40)
TOTAL PROTEIN: 6.5 g/dL (ref 6.0–8.5)

## 2016-03-11 LAB — NMR, LIPOPROFILE
Cholesterol: 139 mg/dL (ref 100–199)
HDL CHOLESTEROL BY NMR: 56 mg/dL (ref >39)
HDL Particle Number: 31.4 umol/L (ref >=30.5)
LDL Particle Number: 837 nmol/L (ref <1000)
LDL Size: 20.9 nm (ref >20.5)
LDL-C: 66 mg/dL (ref 0–99)
LP-IR Score: <25 (ref <=45)
SMALL LDL PARTICLE NUMBER: 312 nmol/L (ref <=527)
TRIGLYCERIDES BY NMR: 84 mg/dL (ref 0–149)

## 2016-03-11 LAB — VITAMIN D 25 HYDROXY (VIT D DEFICIENCY, FRACTURES): Vit D, 25-Hydroxy: 62.6 ng/mL (ref 30.0–100.0)

## 2016-03-17 DIAGNOSIS — J309 Allergic rhinitis, unspecified: Secondary | ICD-10-CM | POA: Insufficient documentation

## 2016-03-17 DIAGNOSIS — R040 Epistaxis: Secondary | ICD-10-CM | POA: Diagnosis not present

## 2016-03-19 ENCOUNTER — Ambulatory Visit (INDEPENDENT_AMBULATORY_CARE_PROVIDER_SITE_OTHER): Payer: Medicare Other | Admitting: Family Medicine

## 2016-03-19 ENCOUNTER — Encounter: Payer: Self-pay | Admitting: Family Medicine

## 2016-03-19 VITALS — BP 184/64 | HR 55 | Temp 97.9°F | Ht 66.0 in | Wt 126.0 lb

## 2016-03-19 DIAGNOSIS — E559 Vitamin D deficiency, unspecified: Secondary | ICD-10-CM | POA: Diagnosis not present

## 2016-03-19 DIAGNOSIS — D696 Thrombocytopenia, unspecified: Secondary | ICD-10-CM | POA: Diagnosis not present

## 2016-03-19 DIAGNOSIS — R030 Elevated blood-pressure reading, without diagnosis of hypertension: Secondary | ICD-10-CM | POA: Diagnosis not present

## 2016-03-19 DIAGNOSIS — N183 Chronic kidney disease, stage 3 unspecified: Secondary | ICD-10-CM

## 2016-03-19 DIAGNOSIS — R0989 Other specified symptoms and signs involving the circulatory and respiratory systems: Secondary | ICD-10-CM

## 2016-03-19 DIAGNOSIS — N189 Chronic kidney disease, unspecified: Secondary | ICD-10-CM

## 2016-03-19 DIAGNOSIS — I10 Essential (primary) hypertension: Secondary | ICD-10-CM

## 2016-03-19 DIAGNOSIS — E785 Hyperlipidemia, unspecified: Secondary | ICD-10-CM | POA: Diagnosis not present

## 2016-03-19 DIAGNOSIS — I480 Paroxysmal atrial fibrillation: Secondary | ICD-10-CM | POA: Diagnosis not present

## 2016-03-19 DIAGNOSIS — IMO0001 Reserved for inherently not codable concepts without codable children: Secondary | ICD-10-CM

## 2016-03-19 NOTE — Progress Notes (Signed)
Subjective:    Patient ID: [name], female    DOB: February 14, 1934, 82 y.o.   MRN: [id number]  HPI Pt here for follow up and management of chronic medical problems which includes hyperlipidemia, hypothyroid, and a fib. She is taking medications regularly.The patient is pleasant and doing well. Her blood pressure is elevated this morning at 184. He is had lab work and we will review this with her during the visit. Her creatinine was 1.43. Blood sugar was good and electrolytes were good. Cholesterol numbers with advanced lipid testing were all excellent and at goal. The CBC had a white blood cell count that was slightly decreased but the hemoglobin was good at 11.6 and the platelet count was decreased at 114,000. All liver function tests were normal and the vitamin D level was good at 62.6. The previous creatinine was 1.30.    Patient Active Problem List   Diagnosis Date Noted  . Renal vascular disease 10/31/2015  . Nodule of right lung 10/31/2015  . Asthma without status asthmaticus 10/31/2015  . Essential hypertension, malignant   . Hypothyroidism 10/15/2015  . Pancytopenia ([place]) 10/15/2015  . Hyponatremia 10/14/2015  . Thrombocytopenia ([place]) 06/16/2015  . Right renal artery stenosis ([place]) 05/09/2015  . Chronic diastolic heart failure ([place]) 04/03/2014  . Acute diastolic heart failure ([place]) 04/03/2014  . Hypertensive urgency 04/02/2014  . Headache 04/02/2014  . CKD (chronic kidney disease) stage 3, GFR 30-59 ml/min 04/02/2014  . Paroxysmal atrial fibrillation ([place]) 01/25/2014  . Hypokalemia 07/19/2013  . Anemia 07/19/2013  . Bilateral lower extremity edema 06/30/2013  . Hypertension with fluid overload 06/30/2013  . Need for prophylactic vaccination and inoculation against influenza 05/31/2013  . Pedal edema 05/10/2013  . Varicose veins of lower extremities with other complications [id number]  . ABNORMAL STRESS ELECTROCARDIOGRAM 01/23/2010  . Hyperlipidemia 12/18/2009  .  Essential hypertension 12/18/2009  . RENAL INSUFFICIENCY, CHRONIC 12/18/2009  . ARTHRITIS 12/18/2009   Outpatient Encounter Prescriptions as of 03/19/2016  Medication Sig  . ALPRAZolam (XANAX) 0.25 MG tablet Take 1 tablet (0.25 mg total) by mouth at bedtime as needed for anxiety.  Marland Kitchen aspirin EC 81 MG tablet Take 81 mg by mouth every other day.  Marland Kitchen atenolol-chlorthalidone (TENORETIC) 50-25 MG tablet Take 1 tablet by mouth daily.  Marland Kitchen atorvastatin (LIPITOR) 20 MG tablet TAKE 1/2 TABLET DAILY  . calcium-vitamin D (OSCAL WITH D) 500-200 MG-UNIT per tablet Take 1 tablet by mouth daily.    . cloNIDine (CATAPRES) 0.1 MG tablet Take 0.1 mg by mouth 2 (two) times daily.   . ferrous sulfate ([employer] IRON) 325 (65 FE) MG tablet Take 325 mg by mouth 2 (two) times daily with a meal.  . flecainide (TAMBOCOR) 50 MG tablet TAKE 1 TABLET (50 MG TOTAL) BY MOUTH 2 (TWO) TIMES DAILY.  . hydrALAZINE (APRESOLINE) 50 MG tablet Take 50 mg by mouth 2 (two) times daily.   . hydroxypropyl methylcellulose / hypromellose (ISOPTO TEARS / GONIOVISC) 2.5 % ophthalmic solution Place 1 drop into both eyes as needed for dry eyes.  Marland Kitchen levothyroxine (SYNTHROID, LEVOTHROID) 75 MCG tablet TAKE 1 TABLET (75 MCG TOTAL) BY MOUTH DAILY.  Marland Kitchen lisinopril (PRINIVIL,ZESTRIL) 40 MG tablet Take 40 mg by mouth daily.  . Omega-3 Fatty Acids (FISH OIL) 1200 MG CAPS Take 1,200 mg by mouth daily.   No facility-administered encounter medications on file as of 03/19/2016.         Review of Systems  Constitutional: Negative.   HENT: Negative.   Eyes:  Negative.   Respiratory: Negative.   Cardiovascular: Negative.        Elevated BP   Gastrointestinal: Negative.   Endocrine: Negative.   Genitourinary: Negative.   Musculoskeletal: Negative.   Skin: Negative.   Allergic/Immunologic: Negative.   Neurological: Negative.   Hematological: Negative.   Psychiatric/Behavioral: Negative.        Objective:   Physical Exam  Constitutional: She is  oriented to person, place, and time. She appears well-developed and well-nourished.  HENT:  Head: Normocephalic and atraumatic.  Right Ear: External ear normal.  Left Ear: External ear normal.  Mouth/Throat: Oropharynx is clear and moist.  Nasal irritation right nostril  Eyes: Conjunctivae and EOM are normal. Pupils are equal, round, and reactive to light. Right eye exhibits no discharge. Left eye exhibits no discharge. No scleral icterus.  Neck: Normal range of motion. Neck supple. No thyromegaly present.  Faint bilateral carotid bruits  Cardiovascular: Normal rate, regular rhythm and intact distal pulses.   No murmur heard. Rhythm is regular at 60/m  Pulmonary/Chest: Effort normal and breath sounds normal. No respiratory distress. She has no wheezes. She has no rales.  Clear anteriorly and posteriorly  Abdominal: Soft. Bowel sounds are normal. She exhibits no mass. There is no tenderness. There is no rebound and no guarding.  No abdominal tenderness masses or organ enlargement  Musculoskeletal: Normal range of motion. She exhibits no edema.  Lymphadenopathy:    She has no cervical adenopathy.  Neurological: She is alert and oriented to person, place, and time. She has normal reflexes. No cranial nerve deficit.  Skin: Skin is warm and dry. No rash noted.  Psychiatric: She has a normal mood and affect. Her behavior is normal. Judgment and thought content normal.  Nursing note and vitals reviewed.  BP (!) 184/64 (BP Location: Left Arm)   Pulse (!) 55   Temp 97.9 F (36.6 C) (Oral)   Ht 5\' 6"  (1.676 m)   Wt 126 lb (57.2 kg)   BMI 20.34 kg/m         Assessment & Plan:  1. Essential hypertension -The blood pressure is elevated today and she brings in readings for review and this has been elevated for the past several days. She has had increasing headaches and some nausea with this. We will contact her nephrologist and make sure he gets a copy of her readings and let her know of  any recommendations that he has before he sees her at the next visit.  2. CKD (chronic kidney disease) stage 3, GFR 30-59 ml/min -The most recent creatinine was slightly more elevated than the previous one. Between the nephrologist and this office we will continue to monitor this.  3. HLD (hyperlipidemia) -She will continue with current treatment and aggressive therapeutic lifestyle changes  4. Vitamin D deficiency -The vitamin D level was good and she will continue with current treatment  5. Thrombocytopenia (HCC) -The platelet count was diminished and it has been in the past.  6. Paroxysmal atrial fibrillation (HCC) -The heart rhythm is regular today and she has regular follow-ups visit with Dr. Meda Coffee and has one scheduled for this fall.  7. CKD (chronic kidney disease), unspecified stage -Follow-up with nephrology as planned  8. Bilateral carotid bruits -Schedule carotid Dopplers - US Carotid Duplex Bilateral; Future  9. Elevated blood pressure -Check with nephrologist and make recommendations regarding elevated blood pressure  Patient Instructions  Medicare Annual Wellness Visit  [facility] and the medical providers at [facility] strive to bring you the best medical care.  In doing so we not only want to address your current medical conditions and concerns but also to detect new conditions early and prevent illness, disease and health-related problems.    Medicare offers a yearly Wellness Visit which allows our clinical staff to assess your need for preventative services including immunizations, lifestyle education, counseling to decrease risk of preventable diseases and screening for fall risk and other medical concerns.    This visit is provided free of charge (no copay) for all Medicare recipients. The clinical pharmacists at [facility] have begun to conduct these Wellness Visits which will also include a  thorough review of all your medications.    As you primary medical provider recommend that you make an appointment for your Annual Wellness Visit if you have not done so already this year.  You may set up this appointment before you leave today or you may call back [phone] and schedule an appointment.  Please make sure when you call that you mention that you are scheduling your Annual Wellness Visit with the clinical pharmacist so that the appointment may be made for the proper length of time.     Continue current medications. Continue good therapeutic lifestyle changes which include good diet and exercise. Fall precautions discussed with patient. If an FOBT was given today- please return it to our front desk. If you are over 52 years old - you may need Prevnar 31 or the adult Pneumonia vaccine.  After your visit with [place] today you will receive a survey in the mail or online from [employer] regarding your care with [place]. Please take a moment to fill this out. Your feedback is very important to [place] as you can help [place] better understand your patient needs as well as improve your experience and satisfaction. WE CARE ABOUT YOU!!!   Because of the carotid bruits on exam today we will arrange for you to have Dopplers of your carotid arteries. Someone will call you with this appointment in order to get this test done. Continue to follow-up with nephrology We will talk to the nephrologist and send him a copy of your blood pressure readings so that he is aware of the elevated readings you have had recently and hopefully he will call you as to recommendations of things to do prior to your next visit with him Kidney to walk and exercise regularly Use nasal saline especially in the right nostril as directed    Arrie Senate MD

## 2016-03-19 NOTE — Patient Instructions (Addendum)
Medicare Annual Wellness Visit  [facility] and the medical providers at [facility] strive to bring you the best medical care.  In doing so we not only want to address your current medical conditions and concerns but also to detect new conditions early and prevent illness, disease and health-related problems.    Medicare offers a yearly Wellness Visit which allows our clinical staff to assess your need for preventative services including immunizations, lifestyle education, counseling to decrease risk of preventable diseases and screening for fall risk and other medical concerns.    This visit is provided free of charge (no copay) for all Medicare recipients. The clinical pharmacists at [facility] have begun to conduct these Wellness Visits which will also include a thorough review of all your medications.    As you primary medical provider recommend that you make an appointment for your Annual Wellness Visit if you have not done so already this year.  You may set up this appointment before you leave today or you may call back [phone] and schedule an appointment.  Please make sure when you call that you mention that you are scheduling your Annual Wellness Visit with the clinical pharmacist so that the appointment may be made for the proper length of time.     Continue current medications. Continue good therapeutic lifestyle changes which include good diet and exercise. Fall precautions discussed with patient. If an FOBT was given today- please return it to our front desk. If you are over 36 years old - you may need Prevnar 32 or the adult Pneumonia vaccine.  After your visit with [place] today you will receive a survey in the mail or online from [employer] regarding your care with [place]. Please take a moment to fill this out. Your feedback is very important to [place] as you can help [place] better understand your patient needs as well as improve  your experience and satisfaction. WE CARE ABOUT YOU!!!   Because of the carotid bruits on exam today we will arrange for you to have Dopplers of your carotid arteries. Someone will call you with this appointment in order to get this test done. Continue to follow-up with nephrology We will talk to the nephrologist and send him a copy of your blood pressure readings so that he is aware of the elevated readings you have had recently and hopefully he will call you as to recommendations of things to do prior to your next visit with him Kidney to walk and exercise regularly Use nasal saline especially in the right nostril as directed

## 2016-03-21 ENCOUNTER — Ambulatory Visit
Admission: RE | Admit: 2016-03-21 | Discharge: 2016-03-21 | Disposition: A | Discharge status: Home / Self Care | Payer: Medicare Other | Source: Ambulatory Visit | Attending: Family Medicine | Admitting: Family Medicine

## 2016-03-21 DIAGNOSIS — I6523 Occlusion and stenosis of bilateral carotid arteries: Secondary | ICD-10-CM | POA: Diagnosis not present

## 2016-03-21 DIAGNOSIS — R0989 Other specified symptoms and signs involving the circulatory and respiratory systems: Secondary | ICD-10-CM

## 2016-04-03 ENCOUNTER — Telehealth: Payer: Self-pay | Admitting: Cardiology

## 2016-04-03 NOTE — Telephone Encounter (Signed)
New message  Pt call requesting to switch provider care to Dr Percival Spanish. Patient would like to be seen in the [place] office. Would it be okay to switch per patients request.

## 2016-04-03 NOTE — Telephone Encounter (Signed)
That's perfectly fine with me, please wish her good luck.

## 2016-04-03 NOTE — Telephone Encounter (Signed)
Would need to route this message to Dr Percival Spanish and Covering RN to make them aware of pts request to transfer her care to them, for commute reasons.  This will provide appropriate documentation of agreement between both Providers.  Pt will be scheduled accordingly thereafter.

## 2016-04-04 NOTE — Telephone Encounter (Signed)
OK with me.

## 2016-04-05 ENCOUNTER — Other Ambulatory Visit: Payer: Self-pay | Admitting: Family Medicine

## 2016-04-08 MED ORDER — FLECAINIDE ACETATE 50 MG PO TABS: 50.0000 mg | ORAL_TABLET | Freq: Two times a day (BID) | ORAL | 0 refills | Status: DC

## 2016-04-08 NOTE — Telephone Encounter (Signed)
[place] appt with Dr Percival Spanish has been scheduled.

## 2016-04-08 NOTE — Telephone Encounter (Signed)
Pt spoke w/ me today about this, and I have informed her that Dr. Percival Spanish and Dr. Meda Coffee have approved switch.  Will send request to schedulers to arrange appt for pt to be seen in [place] as patient notes she is due for yearly f/u. I have also sent refill for patient's flecainide at her request.  Pt aware.

## 2016-04-16 DIAGNOSIS — N2889 Other specified disorders of kidney and ureter: Secondary | ICD-10-CM | POA: Diagnosis not present

## 2016-04-16 DIAGNOSIS — N183 Chronic kidney disease, stage 3 (moderate): Secondary | ICD-10-CM | POA: Diagnosis not present

## 2016-04-16 DIAGNOSIS — E785 Hyperlipidemia, unspecified: Secondary | ICD-10-CM | POA: Diagnosis not present

## 2016-04-16 DIAGNOSIS — I129 Hypertensive chronic kidney disease with stage 1 through stage 4 chronic kidney disease, or unspecified chronic kidney disease: Secondary | ICD-10-CM | POA: Diagnosis not present

## 2016-04-23 ENCOUNTER — Telehealth: Payer: Self-pay | Admitting: Cardiology

## 2016-04-23 NOTE — Telephone Encounter (Signed)
Received records from [facility] for appointment on 06/11/16 with Dr Percival Spanish in [place].  Records given to [facility] (medical records) for Dr Hochrein's schedule on 06/11/16. lp

## 2016-04-29 ENCOUNTER — Ambulatory Visit: Payer: Medicare Other | Admitting: Family Medicine

## 2016-04-29 ENCOUNTER — Ambulatory Visit (INDEPENDENT_AMBULATORY_CARE_PROVIDER_SITE_OTHER): Payer: Medicare Other | Admitting: Family Medicine

## 2016-04-29 VITALS — BP 132/53 | HR 55 | Temp 99.0°F | Ht 66.0 in | Wt 127.8 lb

## 2016-04-29 DIAGNOSIS — J209 Acute bronchitis, unspecified: Secondary | ICD-10-CM

## 2016-04-29 MED ORDER — TRIAMCINOLONE ACETONIDE 40 MG/ML IJ SUSP
40.0000 mg | Freq: Once | INTRAMUSCULAR | Status: AC
  Administered 2016-04-29: 40 mg via INTRAMUSCULAR

## 2016-04-29 MED ORDER — CEFDINIR 300 MG PO CAPS: 300.0000 mg | ORAL_CAPSULE | Freq: Two times a day (BID) | ORAL | 0 refills | Status: DC

## 2016-04-29 MED ORDER — FLUTICASONE PROPIONATE 50 MCG/ACT NA SUSP: 2.0000 | Freq: Every day | NASAL | 0 refills | Status: DC

## 2016-04-29 NOTE — Patient Instructions (Signed)
Great to see you!  Be sure to finish all antibiotics  Try mucinex DM 12 hour for your cough  Come back if you have any concerns or are not getting better as expected   Acute Bronchitis Bronchitis is when the airways that extend from the windpipe into the lungs get red, puffy, and painful (inflamed). Bronchitis often causes thick spit (mucus) to develop. This leads to a cough. A cough is the most common symptom of bronchitis. In acute bronchitis, the condition usually begins suddenly and goes away over time (usually in 2 weeks). Smoking, allergies, and asthma can make bronchitis worse. Repeated episodes of bronchitis may cause more lung problems. HOME CARE  Rest.  Drink enough fluids to keep your pee (urine) clear or pale yellow (unless you need to limit fluids as told by your doctor).  Only take over-the-counter or prescription medicines as told by your doctor.  Avoid smoking and secondhand smoke. These can make bronchitis worse. If you are a smoker, think about using nicotine gum or skin patches. Quitting smoking will help your lungs heal faster.  Reduce the chance of getting bronchitis again by:  Washing your hands often.  Avoiding people with cold symptoms.  Trying not to touch your hands to your mouth, nose, or eyes.  Follow up with your doctor as told. GET HELP IF: Your symptoms do not improve after 1 week of treatment. Symptoms include:  Cough.  Fever.  Coughing up thick spit.  Body aches.  Chest congestion.  Chills.  Shortness of breath.  Sore throat. GET HELP RIGHT AWAY IF:   You have an increased fever.  You have chills.  You have severe shortness of breath.  You have bloody thick spit (sputum).  You throw up (vomit) often.  You lose too much body fluid (dehydration).  You have a severe headache.  You faint. MAKE SURE YOU:   Understand these instructions.  Will watch your condition.  Will get help right away if you are not doing well or  get worse.   This information is not intended to replace advice given to you by your health care provider. Make sure you discuss any questions you have with your health care provider.   Document Released: 01/21/2008 Document Revised: 04/06/2013 Document Reviewed: 01/25/2013 Elsevier Interactive Patient Education [employer].

## 2016-04-29 NOTE — Progress Notes (Signed)
   HPI  Patient presents today here with cough.  Patient explains that over the last 5-6 days she's had slowly worsening cough, nasal congestion, malaise, and loss of appetite, she's also had subjective fever.  She's had some cold chills.  She states that she's had 2 similar episodes over the last 1 year, it's been at least 4 months since she was on antibiotics. She's had good relief previously with antibiotics and IM steroids.  She's tolerating food and fluids normally however has reduced appetite. She has no measured fevers  PMH: Smoking status noted ROS: Per HPI  Objective: BP (!) 132/53 (BP Location: Left Arm, Patient Position: Sitting, Cuff Size: Normal)   Pulse (!) 55   Temp 99 F (37.2 C) (Oral)   Ht 5\' 6"  (1.676 m)   Wt 127 lb 12.8 oz (58 kg)   BMI 20.63 kg/m  Gen: NAD, alert, cooperative with exam HEENT: NCAT, no facial pain to palpation, TMs normal bilaterally, oropharynx clear CV: RRR, good S1/S2, no murmur Resp: Decreased air movement, nonlabored, some scattered wheezes with a wheezy cough as well Ext: No edema, warm Neuro: Alert and oriented, No gross deficits  Assessment and plan:  # Acute bronchitis Appears to be recurrent episode with possible underlying asthma Treat with IM Kenalog, Omnicef, and over-the-counter cough suppressant. Discussed supportive care Discussed usual course of illness and reasons to return, follow-up for any concerns.   Meds ordered this encounter  Medications  . cefdinir (OMNICEF) 300 MG capsule    Sig: Take 1 capsule (300 mg total) by mouth 2 (two) times daily. 1 po BID    Dispense:  20 capsule    Refill:  0  . fluticasone (FLONASE) 50 MCG/ACT nasal spray    Sig: Place 2 sprays into both nostrils daily.    Dispense:  16 g    Refill:  0  . triamcinolone acetonide (KENALOG-40) injection 40 mg    Laroy Apple, MD Tristan Schroeder [facility] 04/29/2016, 12:09 PM

## 2016-05-09 DIAGNOSIS — D239 Other benign neoplasm of skin, unspecified: Secondary | ICD-10-CM | POA: Diagnosis not present

## 2016-05-09 DIAGNOSIS — L570 Actinic keratosis: Secondary | ICD-10-CM | POA: Diagnosis not present

## 2016-05-12 ENCOUNTER — Telehealth: Payer: Self-pay | Admitting: Family Medicine

## 2016-05-12 NOTE — Telephone Encounter (Signed)
Appointment made

## 2016-05-13 ENCOUNTER — Encounter: Payer: Self-pay | Admitting: Family Medicine

## 2016-05-13 ENCOUNTER — Ambulatory Visit (INDEPENDENT_AMBULATORY_CARE_PROVIDER_SITE_OTHER): Payer: Medicare Other | Admitting: Family Medicine

## 2016-05-13 VITALS — BP 164/63 | HR 56 | Temp 97.4°F | Ht 66.0 in | Wt 123.0 lb

## 2016-05-13 DIAGNOSIS — D696 Thrombocytopenia, unspecified: Secondary | ICD-10-CM | POA: Diagnosis not present

## 2016-05-13 DIAGNOSIS — J209 Acute bronchitis, unspecified: Secondary | ICD-10-CM

## 2016-05-13 DIAGNOSIS — R05 Cough: Secondary | ICD-10-CM | POA: Diagnosis not present

## 2016-05-13 DIAGNOSIS — R059 Cough, unspecified: Secondary | ICD-10-CM

## 2016-05-13 DIAGNOSIS — R238 Other skin changes: Secondary | ICD-10-CM

## 2016-05-13 DIAGNOSIS — R233 Spontaneous ecchymoses: Secondary | ICD-10-CM

## 2016-05-13 NOTE — Patient Instructions (Addendum)
Drink plenty of fluids Take regular Mucinex, 1 twice daily with a large glass of water Call us in a few days and let us know how you are doing We will call with the results of the CBC and platelet count is soon as those results become available

## 2016-05-13 NOTE — Progress Notes (Signed)
Subjective:    Patient ID: [name], female    DOB: 10-Sep-1933, 82 y.o.   MRN: [id number]  HPI Patient here today for follow up on cough. She is feeling better. The patient just finished a course of Omnicef with a shot of Depo-Medrol 40 mg.Patient deficit she is better and even the past couple days the cough seems to be improving. The sputum is clear in color. She does not seem to be too stressed about this. She says her blood pressure at home was 353 systolic this morning. Her blood pressure readings have been a lot better. Her biggest complaint today continues to be the cough. She is somewhat concerned about the easy bruising on her anterior legs and has questions about her thrombocytopenia. We plan to repeat the CBC anyway to gauge this infection that she is had and we will check the platelet count with the CBC.    Patient Active Problem List   Diagnosis Date Noted  . Renal vascular disease 10/31/2015  . Nodule of right lung 10/31/2015  . Asthma without status asthmaticus 10/31/2015  . Essential hypertension, malignant   . Hypothyroidism 10/15/2015  . Pancytopenia ([place]) 10/15/2015  . Hyponatremia 10/14/2015  . Thrombocytopenia ([place]) 06/16/2015  . Right renal artery stenosis ([place]) 05/09/2015  . Chronic diastolic heart failure ([place]) 04/03/2014  . Acute diastolic heart failure ([place]) 04/03/2014  . Hypertensive urgency 04/02/2014  . Headache 04/02/2014  . CKD (chronic kidney disease) stage 3, GFR 30-59 ml/min 04/02/2014  . Paroxysmal atrial fibrillation ([place]) 01/25/2014  . Hypokalemia 07/19/2013  . Anemia 07/19/2013  . Bilateral lower extremity edema 06/30/2013  . Hypertension with fluid overload 06/30/2013  . Need for prophylactic vaccination and inoculation against influenza 05/31/2013  . Pedal edema 05/10/2013  . Varicose veins of lower extremities with other complications 29/92/4268  . ABNORMAL STRESS ELECTROCARDIOGRAM 01/23/2010  . Hyperlipidemia 12/18/2009  . Essential  hypertension 12/18/2009  . RENAL INSUFFICIENCY, CHRONIC 12/18/2009  . ARTHRITIS 12/18/2009   Outpatient Encounter Prescriptions as of 05/13/2016  Medication Sig  . aspirin EC 81 MG tablet Take 81 mg by mouth daily.   Marland Kitchen atenolol-chlorthalidone (TENORETIC) 50-25 MG tablet Take 1 tablet by mouth daily.  Marland Kitchen atorvastatin (LIPITOR) 20 MG tablet TAKE 1/2 TABLET DAILY  . calcium-vitamin D (OSCAL WITH D) 500-200 MG-UNIT per tablet Take 1 tablet by mouth daily.    . cloNIDine (CATAPRES) 0.1 MG tablet Take 0.1 mg by mouth 3 (three) times daily.   . ferrous sulfate ([facility] IRON) 325 (65 FE) MG tablet Take 325 mg by mouth 2 (two) times daily with a meal.  . flecainide (TAMBOCOR) 50 MG tablet Take 1 tablet (50 mg total) by mouth 2 (two) times daily.  . fluticasone (FLONASE) 50 MCG/ACT nasal spray Place 2 sprays into both nostrils daily.  . hydrALAZINE (APRESOLINE) 50 MG tablet Take 50 mg by mouth 2 (two) times daily.   . hydroxypropyl methylcellulose / hypromellose (ISOPTO TEARS / GONIOVISC) 2.5 % ophthalmic solution Place 1 drop into both eyes as needed for dry eyes.  Marland Kitchen levothyroxine (SYNTHROID, LEVOTHROID) 75 MCG tablet TAKE 1 TABLET (75 MCG TOTAL) BY MOUTH DAILY.  Marland Kitchen lisinopril (PRINIVIL,ZESTRIL) 40 MG tablet TAKE 1 TABLET BY MOUTH TWICE A DAY  . Omega-3 Fatty Acids (FISH OIL) 1200 MG CAPS Take 1,200 mg by mouth daily.  . [DISCONTINUED] cefdinir (OMNICEF) 300 MG capsule Take 1 capsule (300 mg total) by mouth 2 (two) times daily. 1 po BID   No facility-administered encounter medications  on file as of 05/13/2016.       Review of Systems  Constitutional: Negative.   HENT: Negative.   Eyes: Negative.   Respiratory: Positive for cough.   Cardiovascular: Negative.   Gastrointestinal: Negative.   Endocrine: Negative.   Genitourinary: Negative.   Musculoskeletal: Negative.   Skin: Negative.   Allergic/Immunologic: Negative.   Neurological: Negative.   Hematological: Negative.   Psychiatric/Behavioral:  Negative.        Objective:   Physical Exam  Constitutional: She is oriented to person, place, and time. She appears well-developed and well-nourished. No distress.  HENT:  Head: Normocephalic and atraumatic.  Right Ear: External ear normal.  Left Ear: External ear normal.  Nose: Nose normal.  Mouth/Throat: Oropharynx is clear and moist.  Eyes: Conjunctivae and EOM are normal. Pupils are equal, round, and reactive to light. Right eye exhibits no discharge. Left eye exhibits no discharge. No scleral icterus.  Neck: Normal range of motion. Neck supple. No thyromegaly present.  Cardiovascular: Normal rate, regular rhythm and normal heart sounds.   No murmur heard. Pulmonary/Chest: Effort normal and breath sounds normal. No respiratory distress. She has no wheezes. She has no rales.  Some coarse bronchial sounds with coughing and no wheezing  Musculoskeletal: Normal range of motion. She exhibits no edema.  Lymphadenopathy:    She has no cervical adenopathy.  Neurological: She is alert and oriented to person, place, and time.  Skin: Skin is warm and dry. No rash noted.  Psychiatric: She has a normal mood and affect. Her behavior is normal. Judgment and thought content normal.  Nursing note and vitals reviewed.   BP (!) 164/63 (BP Location: Left Arm)   Pulse (!) 56   Temp 97.4 F (36.3 C) (Oral)   Ht 5\' 6"  (1.676 m)   Wt 123 lb (55.8 kg)   BMI 19.85 kg/m   A CBC will be drawn before the patient leaves     Assessment & Plan:  1. Cough -Continue with Mucinex regular strength 1 twice daily with a large glass of water - CBC with Differential/Platelet - DG Chest 2 View; Future  2. Bronchitis, acute, with bronchospasm -Drink plenty of fluids and stay well hydrated and continue with Mucinex and use nasal saline  3. Easy bruising -Check CBC  4. Thrombocytopenia ([place]) -Recheck CBC  Patient Instructions  Drink plenty of fluids Take regular Mucinex, 1 twice daily with a large  glass of water Call us in a few days and let us know how you are doing We will call with the results of the CBC and platelet count is soon as those results become available  Arrie Senate MD

## 2016-05-14 LAB — CBC WITH DIFFERENTIAL/PLATELET
BASOS: 0 %
Basophils Absolute: 0 10*3/uL (ref 0.0–0.2)
EOS (ABSOLUTE): 0.1 10*3/uL (ref 0.0–0.4)
EOS: 1 %
HEMATOCRIT: 35.4 % (ref 34.0–46.6)
Hemoglobin: 12 g/dL (ref 11.1–15.9)
IMMATURE GRANULOCYTES: 0 %
Immature Grans (Abs): 0 10*3/uL (ref 0.0–0.1)
LYMPHS ABS: 1.2 10*3/uL (ref 0.7–3.1)
Lymphs: 21 %
MCH: 28.8 pg (ref 26.6–33.0)
MCHC: 33.9 g/dL (ref 31.5–35.7)
MCV: 85 fL (ref 79–97)
MONOS ABS: 0.6 10*3/uL (ref 0.1–0.9)
Monocytes: 11 %
NEUTROS PCT: 67 %
Neutrophils Absolute: 4 10*3/uL (ref 1.4–7.0)
PLATELETS: 186 10*3/uL (ref 150–379)
RBC: 4.17 x10E6/uL (ref 3.77–5.28)
RDW: 15 % (ref 12.3–15.4)
WBC: 5.9 10*3/uL (ref 3.4–10.8)

## 2016-05-22 ENCOUNTER — Ambulatory Visit (INDEPENDENT_AMBULATORY_CARE_PROVIDER_SITE_OTHER): Payer: Medicare Other | Admitting: Family Medicine

## 2016-05-22 ENCOUNTER — Encounter: Payer: Self-pay | Admitting: Family Medicine

## 2016-05-22 VITALS — BP 171/63 | HR 54 | Temp 96.7°F | Ht 66.0 in | Wt 125.0 lb

## 2016-05-22 DIAGNOSIS — S81811A Laceration without foreign body, right lower leg, initial encounter: Secondary | ICD-10-CM | POA: Diagnosis not present

## 2016-05-22 MED ORDER — CEPHALEXIN 500 MG PO CAPS: 500.0000 mg | ORAL_CAPSULE | Freq: Three times a day (TID) | ORAL | 0 refills | Status: DC

## 2016-05-22 NOTE — Progress Notes (Signed)
Subjective:    Patient ID: [name], female    DOB: 08-Aug-1934, 82 y.o.   MRN: [id number]  HPI Patient here today for skin tear to right lower leg. She place steri-strips on this area at home. This happened about 8 days ago. She noticed some increased swelling just yesterday but this was slightly improved today. There is also more redness yesterday than today. The areas of skin appeared to be well approximated.    Patient Active Problem List   Diagnosis Date Noted  . Renal vascular disease 10/31/2015  . Nodule of right lung 10/31/2015  . Asthma without status asthmaticus 10/31/2015  . Essential hypertension, malignant   . Hypothyroidism 10/15/2015  . Pancytopenia ([place]) 10/15/2015  . Hyponatremia 10/14/2015  . Thrombocytopenia ([place]) 06/16/2015  . Right renal artery stenosis ([place]) 05/09/2015  . Chronic diastolic heart failure ([place]) 04/03/2014  . Acute diastolic heart failure ([place]) 04/03/2014  . Hypertensive urgency 04/02/2014  . Headache 04/02/2014  . CKD (chronic kidney disease) stage 3, GFR 30-59 ml/min 04/02/2014  . Paroxysmal atrial fibrillation ([place]) 01/25/2014  . Hypokalemia 07/19/2013  . Anemia 07/19/2013  . Bilateral lower extremity edema 06/30/2013  . Hypertension with fluid overload 06/30/2013  . Need for prophylactic vaccination and inoculation against influenza 05/31/2013  . Pedal edema 05/10/2013  . Varicose veins of lower extremities with other complications 29/79/8921  . ABNORMAL STRESS ELECTROCARDIOGRAM 01/23/2010  . Hyperlipidemia 12/18/2009  . Essential hypertension 12/18/2009  . RENAL INSUFFICIENCY, CHRONIC 12/18/2009  . ARTHRITIS 12/18/2009   Outpatient Encounter Prescriptions as of 05/22/2016  Medication Sig  . aspirin EC 81 MG tablet Take 81 mg by mouth daily.   Marland Kitchen atenolol-chlorthalidone (TENORETIC) 50-25 MG tablet Take 1 tablet by mouth daily.  Marland Kitchen atorvastatin (LIPITOR) 20 MG tablet TAKE 1/2 TABLET DAILY  . calcium-vitamin D (OSCAL WITH D)  500-200 MG-UNIT per tablet Take 1 tablet by mouth daily.    . cloNIDine (CATAPRES) 0.1 MG tablet Take 0.1 mg by mouth 3 (three) times daily.   . ferrous sulfate ([facility]S IRON) 325 (65 FE) MG tablet Take 325 mg by mouth 2 (two) times daily with a meal.  . flecainide (TAMBOCOR) 50 MG tablet Take 1 tablet (50 mg total) by mouth 2 (two) times daily.  . fluticasone (FLONASE) 50 MCG/ACT nasal spray Place 2 sprays into both nostrils daily.  . hydrALAZINE (APRESOLINE) 50 MG tablet Take 50 mg by mouth 2 (two) times daily.   . hydroxypropyl methylcellulose / hypromellose (ISOPTO TEARS / GONIOVISC) 2.5 % ophthalmic solution Place 1 drop into both eyes as needed for dry eyes.  Marland Kitchen levothyroxine (SYNTHROID, LEVOTHROID) 75 MCG tablet TAKE 1 TABLET (75 MCG TOTAL) BY MOUTH DAILY.  Marland Kitchen lisinopril (PRINIVIL,ZESTRIL) 40 MG tablet TAKE 1 TABLET BY MOUTH TWICE A DAY  . Omega-3 Fatty Acids (FISH OIL) 1200 MG CAPS Take 1,200 mg by mouth daily.   No facility-administered encounter medications on file as of 05/22/2016.       Review of Systems  Constitutional: Negative.   HENT: Negative.   Eyes: Negative.   Respiratory: Negative.   Cardiovascular: Negative.   Gastrointestinal: Negative.   Endocrine: Negative.   Genitourinary: Negative.   Musculoskeletal: Negative.   Skin: Positive for wound (skin tear right lower leg with redness and swelling).  Allergic/Immunologic: Negative.   Neurological: Negative.   Hematological: Negative.   Psychiatric/Behavioral: Negative.        Objective:   Physical Exam  Constitutional: She is oriented to person, place, and time.  She appears well-developed and well-nourished. No distress.  HENT:  Head: Normocephalic.  Eyes: Conjunctivae and EOM are normal. Pupils are equal, round, and reactive to light. Right eye exhibits no discharge. Left eye exhibits no discharge. No scleral icterus.  Neck: Normal range of motion.  Musculoskeletal: Normal range of motion.  Neurological: She is  alert and oriented to person, place, and time.  Skin: Skin is warm and dry. No rash noted. There is erythema.  There is a small skin tear of the right lower leg with redness and slight warmth distal to this. There is no drainage. The edges of the wound appeared to be well approximated. The patient does have a lot of bruising on her extremities and she is going to discuss with the nephrologist the need for daily baby aspirin. Her most recent platelet count was within normal limits.  Psychiatric: She has a normal mood and affect. Her behavior is normal. Judgment and thought content normal.    BP (!) 171/63 (BP Location: Left Arm)   Pulse (!) 54   Temp (!) 96.7 F (35.9 C) (Oral)   Ht 5\' 6"  (1.676 m)   Wt 125 lb (56.7 kg)   BMI 20.18 kg/m        Assessment & Plan:  1. Skin tear of lower leg without complication, right, initial encounter -Elevate leg when possible -Let Steri-Strips come off on their own -Take antibiotic until completed -If any increasing signs of redness or infection call the office or come back in 6 days for recheck if not improved  Meds ordered this encounter  Medications  . cephALEXin (KEFLEX) 500 MG capsule    Sig: Take 1 capsule (500 mg total) by mouth 3 (three) times daily.    Dispense:  30 capsule    Refill:  0   Patient Instructions  Elevate legs when possible Take antibiotic until completed Return to clinic in 6 days if redness of leg persist Let Steri-Strips come off on their own  Arrie Senate MD

## 2016-05-22 NOTE — Patient Instructions (Signed)
Elevate legs when possible Take antibiotic until completed Return to clinic in 6 days if redness of leg persist Let Steri-Strips come off on their own

## 2016-05-23 DIAGNOSIS — E785 Hyperlipidemia, unspecified: Secondary | ICD-10-CM | POA: Diagnosis not present

## 2016-05-23 DIAGNOSIS — N2889 Other specified disorders of kidney and ureter: Secondary | ICD-10-CM | POA: Diagnosis not present

## 2016-05-23 DIAGNOSIS — N183 Chronic kidney disease, stage 3 (moderate): Secondary | ICD-10-CM | POA: Diagnosis not present

## 2016-05-23 DIAGNOSIS — I129 Hypertensive chronic kidney disease with stage 1 through stage 4 chronic kidney disease, or unspecified chronic kidney disease: Secondary | ICD-10-CM | POA: Diagnosis not present

## 2016-05-27 ENCOUNTER — Ambulatory Visit (INDEPENDENT_AMBULATORY_CARE_PROVIDER_SITE_OTHER): Payer: Medicare Other | Admitting: Family Medicine

## 2016-05-27 ENCOUNTER — Encounter: Payer: Self-pay | Admitting: Family Medicine

## 2016-05-27 VITALS — BP 147/60 | HR 67 | Temp 97.2°F | Ht 66.0 in | Wt 123.0 lb

## 2016-05-27 DIAGNOSIS — I10 Essential (primary) hypertension: Secondary | ICD-10-CM | POA: Diagnosis not present

## 2016-05-27 DIAGNOSIS — S81811A Laceration without foreign body, right lower leg, initial encounter: Secondary | ICD-10-CM

## 2016-05-27 DIAGNOSIS — S81811D Laceration without foreign body, right lower leg, subsequent encounter: Secondary | ICD-10-CM

## 2016-05-27 DIAGNOSIS — L03116 Cellulitis of left lower limb: Secondary | ICD-10-CM

## 2016-05-27 MED ORDER — CEPHALEXIN 500 MG PO CAPS: 500.0000 mg | ORAL_CAPSULE | Freq: Three times a day (TID) | ORAL | 0 refills | Status: DC

## 2016-05-27 NOTE — Progress Notes (Signed)
Subjective:    Patient ID: [name], female    DOB: 03/26/1934, 82 y.o.   MRN: [id number]  HPI Patient here today for 1 week follow up on right lower leg wound.This patient is followed with many problems. She has malignant hypertension hypothyroidism pancytopenia thrombocytopenia right renal artery stenosis and chronic diastolic heart failure. She returns today after one week of follow-up for a skin tear of her right lower leg. She had Steri-Strip this at home before coming. She returns to the office today to make sure there is no infection. She was placed on cephalexin 500 three daily when she was last seen.      Patient Active Problem List   Diagnosis Date Noted  . Renal vascular disease 10/31/2015  . Nodule of right lung 10/31/2015  . Asthma without status asthmaticus 10/31/2015  . Essential hypertension, malignant   . Hypothyroidism 10/15/2015  . Pancytopenia ([place]) 10/15/2015  . Hyponatremia 10/14/2015  . Thrombocytopenia ([place]) 06/16/2015  . Right renal artery stenosis ([place]) 05/09/2015  . Chronic diastolic heart failure ([place]) 04/03/2014  . Acute diastolic heart failure ([place]) 04/03/2014  . Hypertensive urgency 04/02/2014  . Headache 04/02/2014  . CKD (chronic kidney disease) stage 3, GFR 30-59 ml/min 04/02/2014  . Paroxysmal atrial fibrillation ([place]) 01/25/2014  . Hypokalemia 07/19/2013  . Anemia 07/19/2013  . Bilateral lower extremity edema 06/30/2013  . Hypertension with fluid overload 06/30/2013  . Need for prophylactic vaccination and inoculation against influenza 05/31/2013  . Pedal edema 05/10/2013  . Varicose veins of lower extremities with other complications 39/76/7341  . ABNORMAL STRESS ELECTROCARDIOGRAM 01/23/2010  . Hyperlipidemia 12/18/2009  . Essential hypertension 12/18/2009  . RENAL INSUFFICIENCY, CHRONIC 12/18/2009  . ARTHRITIS 12/18/2009   Outpatient Encounter Prescriptions as of 05/27/2016  Medication Sig  . aspirin EC 81 MG tablet Take 81 mg by  mouth daily.   Marland Kitchen atenolol-chlorthalidone (TENORETIC) 50-25 MG tablet Take 1 tablet by mouth daily.  Marland Kitchen atorvastatin (LIPITOR) 20 MG tablet TAKE 1/2 TABLET DAILY  . calcium-vitamin D (OSCAL WITH D) 500-200 MG-UNIT per tablet Take 1 tablet by mouth daily.    . cephALEXin (KEFLEX) 500 MG capsule Take 1 capsule (500 mg total) by mouth 3 (three) times daily.  . cloNIDine (CATAPRES) 0.1 MG tablet Take 0.1 mg by mouth 3 (three) times daily.   . ferrous sulfate ([facility] IRON) 325 (65 FE) MG tablet Take 325 mg by mouth 2 (two) times daily with a meal.  . flecainide (TAMBOCOR) 50 MG tablet Take 1 tablet (50 mg total) by mouth 2 (two) times daily.  . fluticasone (FLONASE) 50 MCG/ACT nasal spray Place 2 sprays into both nostrils daily.  . hydrALAZINE (APRESOLINE) 50 MG tablet Take 50 mg by mouth 2 (two) times daily.   . hydroxypropyl methylcellulose / hypromellose (ISOPTO TEARS / GONIOVISC) 2.5 % ophthalmic solution Place 1 drop into both eyes as needed for dry eyes.  Marland Kitchen levothyroxine (SYNTHROID, LEVOTHROID) 75 MCG tablet TAKE 1 TABLET (75 MCG TOTAL) BY MOUTH DAILY.  Marland Kitchen lisinopril (PRINIVIL,ZESTRIL) 40 MG tablet TAKE 1 TABLET BY MOUTH TWICE A DAY  . Omega-3 Fatty Acids (FISH OIL) 1200 MG CAPS Take 1,200 mg by mouth daily.   No facility-administered encounter medications on file as of 05/27/2016.       Review of Systems  Constitutional: Negative.   HENT: Negative.   Eyes: Negative.   Respiratory: Negative.   Cardiovascular: Negative.   Gastrointestinal: Negative.   Endocrine: Negative.   Genitourinary: Negative.   Musculoskeletal:  Negative.   Skin: Positive for wound (right lower leg skin tear).  Allergic/Immunologic: Negative.   Neurological: Negative.   Hematological: Negative.   Psychiatric/Behavioral: Negative.        Objective:   Physical Exam  Constitutional: She is oriented to person, place, and time. She appears well-developed and well-nourished.  HENT:  Head: Normocephalic.  Eyes:  Conjunctivae and EOM are normal. Pupils are equal, round, and reactive to light. Right eye exhibits no discharge. Left eye exhibits no discharge. No scleral icterus.  Neck: Normal range of motion.  Musculoskeletal: Normal range of motion. She exhibits tenderness. She exhibits no edema.  Neurological: She is alert and oriented to person, place, and time.  Skin: Skin is warm and dry. There is erythema.  The Steri-Strips at the patient had applied were removed. There was a thin layer of skin over the wound except in the top of the wound and the bottom of the wound and apparently this was avulsed when she tore this initially. The area was cleansed with saline and Adaptic was applied with a pressure dressing she tolerated this well. She will keep the dressing on until she is seen again in a couple of days. We will see her back again next Wednesday for follow-up if necessary. She will stay on the antibiotic 3 times daily.  Psychiatric: She has a normal mood and affect. Her behavior is normal. Judgment and thought content normal.  Nursing note and vitals reviewed.  BP (!) 147/60 (BP Location: Left Arm)   Pulse 67   Temp 97.2 F (36.2 C) (Oral)   Ht 5\' 6"  (1.676 m)   Wt 123 lb (55.8 kg)   BMI 19.85 kg/m         Assessment & Plan:  1. Skin tear of lower leg without complication, right, initial encounter -The Steri-Strips were removed from this and the wound was cleansed. The skin was approximated as well as possible and a small piece of Adaptic with antibiotic ointment was applied with a slightly pressure dressing.  2. Cellulitis of leg, left -Continue antibiotic 3 times daily, cephalexin 500 mg  3. Essential hypertension -The patient has a long history of hypertension and renal artery stenosis with stenting. She will continue to follow-up with a nephrologist as needed for hypertension and blood pressure control.   Meds ordered this encounter  Medications  . cephALEXin (KEFLEX) 500 MG  capsule    Sig: Take 1 capsule (500 mg total) by mouth 3 (three) times daily.    Dispense:  30 capsule    Refill:  0   Patient Instructions  Leave dressing in place until rechecked in a couple of days Continue to take antibiotic Return to clinic in 8 days for follow-up with me if necessary Elevate leg as much as possible  Arrie Senate MD

## 2016-05-27 NOTE — Patient Instructions (Addendum)
Leave dressing in place until rechecked in a couple of days Continue to take antibiotic Return to clinic in 8 days for follow-up with me if necessary She needs to be off her feet as much as possible with the leg elevated.

## 2016-05-28 ENCOUNTER — Ambulatory Visit: Payer: Medicare Other | Admitting: Family Medicine

## 2016-05-29 ENCOUNTER — Ambulatory Visit (INDEPENDENT_AMBULATORY_CARE_PROVIDER_SITE_OTHER): Payer: Medicare Other | Admitting: Family Medicine

## 2016-05-29 ENCOUNTER — Encounter: Payer: Self-pay | Admitting: Family Medicine

## 2016-05-29 VITALS — BP 159/59 | HR 49 | Temp 97.4°F | Ht 66.0 in | Wt 124.0 lb

## 2016-05-29 DIAGNOSIS — S81001D Unspecified open wound, right knee, subsequent encounter: Secondary | ICD-10-CM | POA: Diagnosis not present

## 2016-05-29 DIAGNOSIS — S91001D Unspecified open wound, right ankle, subsequent encounter: Secondary | ICD-10-CM

## 2016-05-29 DIAGNOSIS — S81801D Unspecified open wound, right lower leg, subsequent encounter: Secondary | ICD-10-CM

## 2016-05-29 NOTE — Progress Notes (Signed)
Subjective:    Patient ID: [name], female    DOB: 09/16/1933, 82 y.o.   MRN: [id number]  HPI 82 year old female here to follow-up traumatic wound on her right lower leg. Trauma occurred about 3 weeks ago. She was seen 48 hours prior to this visit wound was cleaned she was put on antibiotic and instructed to return today to follow-up.  Patient Active Problem List   Diagnosis Date Noted  . Renal vascular disease 10/31/2015  . Nodule of right lung 10/31/2015  . Asthma without status asthmaticus 10/31/2015  . Essential hypertension, malignant   . Hypothyroidism 10/15/2015  . Pancytopenia ([place]) 10/15/2015  . Hyponatremia 10/14/2015  . Thrombocytopenia ([place]) 06/16/2015  . Right renal artery stenosis ([place]) 05/09/2015  . Chronic diastolic heart failure ([place]) 04/03/2014  . Acute diastolic heart failure ([place]) 04/03/2014  . Hypertensive urgency 04/02/2014  . Headache 04/02/2014  . CKD (chronic kidney disease) stage 3, GFR 30-59 ml/min 04/02/2014  . Paroxysmal atrial fibrillation ([place]) 01/25/2014  . Hypokalemia 07/19/2013  . Anemia 07/19/2013  . Bilateral lower extremity edema 06/30/2013  . Hypertension with fluid overload 06/30/2013  . Need for prophylactic vaccination and inoculation against influenza 05/31/2013  . Pedal edema 05/10/2013  . Varicose veins of lower extremities with other complications 40/98/1191  . ABNORMAL STRESS ELECTROCARDIOGRAM 01/23/2010  . Hyperlipidemia 12/18/2009  . Essential hypertension 12/18/2009  . RENAL INSUFFICIENCY, CHRONIC 12/18/2009  . ARTHRITIS 12/18/2009   Outpatient Encounter Prescriptions as of 05/29/2016  Medication Sig  . aspirin EC 81 MG tablet Take 81 mg by mouth daily.   Marland Kitchen atenolol-chlorthalidone (TENORETIC) 50-25 MG tablet Take 1 tablet by mouth daily.  Marland Kitchen atorvastatin (LIPITOR) 20 MG tablet TAKE 1/2 TABLET DAILY  . calcium-vitamin D (OSCAL WITH D) 500-200 MG-UNIT per tablet Take 1 tablet by mouth daily.    . cephALEXin (KEFLEX) 500  MG capsule Take 1 capsule (500 mg total) by mouth 3 (three) times daily.  . cloNIDine (CATAPRES) 0.1 MG tablet Take 0.1 mg by mouth 3 (three) times daily.   . ferrous sulfate ([employer] IRON) 325 (65 FE) MG tablet Take 325 mg by mouth 2 (two) times daily with a meal.  . flecainide (TAMBOCOR) 50 MG tablet Take 1 tablet (50 mg total) by mouth 2 (two) times daily.  . fluticasone (FLONASE) 50 MCG/ACT nasal spray Place 2 sprays into both nostrils daily.  . hydrALAZINE (APRESOLINE) 50 MG tablet Take 50 mg by mouth 2 (two) times daily.   . hydroxypropyl methylcellulose / hypromellose (ISOPTO TEARS / GONIOVISC) 2.5 % ophthalmic solution Place 1 drop into both eyes as needed for dry eyes.  Marland Kitchen levothyroxine (SYNTHROID, LEVOTHROID) 75 MCG tablet TAKE 1 TABLET (75 MCG TOTAL) BY MOUTH DAILY.  Marland Kitchen lisinopril (PRINIVIL,ZESTRIL) 40 MG tablet TAKE 1 TABLET BY MOUTH TWICE A DAY  . Omega-3 Fatty Acids (FISH OIL) 1200 MG CAPS Take 1,200 mg by mouth daily.   No facility-administered encounter medications on file as of 05/29/2016.       Review of Systems  Skin: Positive for wound.       Objective:   Physical Exam  Constitutional: She appears well-developed and well-nourished.  Skin:  There is no evidence of cellulitis or infection. There is been minimal drainage since her last visit. I think the wound looks good. I would like to keep it dry at this point and use a Tegaderm dressing. I've asked her to change dressing every other day unless the bandage becomes soiled or bleeds.  BP (!) 159/59   Pulse (!) 49   Temp 97.4 F (36.3 C) (Oral)   Ht 5\' 6"  (1.676 m)   Wt 124 lb (56.2 kg)   BMI 20.01 kg/m         Assessment & Plan:  1. Open wound of right knee, leg, and ankle with complication, subsequent encounter Have asked her to change dressing every other day until she returns next week for her follow-up appointment. Continue with antibiotic. Call or return sooner if needed Wardell Honour MD

## 2016-06-05 ENCOUNTER — Encounter: Payer: Self-pay | Admitting: Family Medicine

## 2016-06-05 ENCOUNTER — Ambulatory Visit (INDEPENDENT_AMBULATORY_CARE_PROVIDER_SITE_OTHER): Payer: Medicare Other | Admitting: Family Medicine

## 2016-06-05 VITALS — BP 151/60 | HR 55 | Temp 98.0°F | Ht 66.0 in | Wt 124.0 lb

## 2016-06-05 DIAGNOSIS — S81801D Unspecified open wound, right lower leg, subsequent encounter: Secondary | ICD-10-CM | POA: Diagnosis not present

## 2016-06-05 DIAGNOSIS — S91001D Unspecified open wound, right ankle, subsequent encounter: Secondary | ICD-10-CM

## 2016-06-05 DIAGNOSIS — S81811A Laceration without foreign body, right lower leg, initial encounter: Secondary | ICD-10-CM

## 2016-06-05 DIAGNOSIS — S81001D Unspecified open wound, right knee, subsequent encounter: Secondary | ICD-10-CM | POA: Diagnosis not present

## 2016-06-05 NOTE — Progress Notes (Signed)
Subjective:    Patient ID: [name], female    DOB: [birth year], 82 y.o.   MRN: [id number]  HPI Patient here today for a 1 week wound follow up - right lower leg. The patient is still taking antibiotics. She is basically applying some Adaptic and using gauze on top of this.    Patient Active Problem List   Diagnosis Date Noted  . Renal vascular disease 10/31/2015  . Nodule of right lung 10/31/2015  . Asthma without status asthmaticus 10/31/2015  . Essential hypertension, malignant   . Hypothyroidism 10/15/2015  . Pancytopenia ([place]) 10/15/2015  . Hyponatremia 10/14/2015  . Thrombocytopenia ([place]) 06/16/2015  . Right renal artery stenosis ([place]) 05/09/2015  . Chronic diastolic heart failure ([place]) 04/03/2014  . Acute diastolic heart failure ([place]) 04/03/2014  . Hypertensive urgency 04/02/2014  . Headache 04/02/2014  . CKD (chronic kidney disease) stage 3, GFR 30-59 ml/min 04/02/2014  . Paroxysmal atrial fibrillation ([place]) 01/25/2014  . Hypokalemia 07/19/2013  . Anemia 07/19/2013  . Bilateral lower extremity edema 06/30/2013  . Hypertension with fluid overload 06/30/2013  . Need for prophylactic vaccination and inoculation against influenza 05/31/2013  . Pedal edema 05/10/2013  . Varicose veins of lower extremities with other complications 76/19/5093  . ABNORMAL STRESS ELECTROCARDIOGRAM 01/23/2010  . Hyperlipidemia 12/18/2009  . Essential hypertension 12/18/2009  . RENAL INSUFFICIENCY, CHRONIC 12/18/2009  . ARTHRITIS 12/18/2009   Outpatient Encounter Prescriptions as of 06/05/2016  Medication Sig  . aspirin EC 81 MG tablet Take 81 mg by mouth every other day.   Marland Kitchen atenolol-chlorthalidone (TENORETIC) 50-25 MG tablet Take 1 tablet by mouth daily.  Marland Kitchen atorvastatin (LIPITOR) 20 MG tablet TAKE 1/2 TABLET DAILY  . calcium-vitamin D (OSCAL WITH D) 500-200 MG-UNIT per tablet Take 1 tablet by mouth daily.    . cephALEXin (KEFLEX) 500 MG capsule Take 1 capsule (500 mg total) by mouth  3 (three) times daily.  . cloNIDine (CATAPRES) 0.1 MG tablet Take 0.1 mg by mouth daily.   . ferrous sulfate ([employer]S IRON) 325 (65 FE) MG tablet Take 325 mg by mouth 2 (two) times daily with a meal.  . flecainide (TAMBOCOR) 50 MG tablet Take 1 tablet (50 mg total) by mouth 2 (two) times daily.  . fluticasone (FLONASE) 50 MCG/ACT nasal spray Place 2 sprays into both nostrils daily.  . hydrALAZINE (APRESOLINE) 50 MG tablet Take 50 mg by mouth 2 (two) times daily.   . hydroxypropyl methylcellulose / hypromellose (ISOPTO TEARS / GONIOVISC) 2.5 % ophthalmic solution Place 1 drop into both eyes as needed for dry eyes.  Marland Kitchen levothyroxine (SYNTHROID, LEVOTHROID) 75 MCG tablet TAKE 1 TABLET (75 MCG TOTAL) BY MOUTH DAILY.  Marland Kitchen lisinopril (PRINIVIL,ZESTRIL) 40 MG tablet TAKE 1 TABLET BY MOUTH TWICE A DAY (Patient taking differently: TAKE 1 TABLET BY MOUTH once A DAY)  . Omega-3 Fatty Acids (FISH OIL) 1200 MG CAPS Take 1,200 mg by mouth daily.   No facility-administered encounter medications on file as of 06/05/2016.       Review of Systems  Constitutional: Negative.   HENT: Negative.   Eyes: Negative.   Respiratory: Negative.   Cardiovascular: Negative.   Gastrointestinal: Negative.   Endocrine: Negative.   Genitourinary: Negative.   Musculoskeletal: Negative.   Skin: Positive for wound (healing right lower leg ).  Allergic/Immunologic: Negative.   Neurological: Negative.   Hematological: Negative.   Psychiatric/Behavioral: Negative.        Objective:   Physical Exam  Constitutional: She is oriented to  person, place, and time. She appears well-developed and well-nourished. No distress.  HENT:  Head: Normocephalic.  Eyes: Conjunctivae and EOM are normal. Pupils are equal, round, and reactive to light. Right eye exhibits no discharge. Left eye exhibits no discharge. No scleral icterus.  Musculoskeletal: Normal range of motion.  Neurological: She is alert and oriented to person, place, and time.    Skin: Skin is warm and dry. No rash noted. No erythema.  There is minimal redness around the wound of the right lower leg. There is an eschar. There is a small area that is open and draining. The area was cleansed with some saline solution. The patient was instructed on how to dress the leg daily and to let it be open to air at times during the day when she is at home. She is also instructed to continue and complete the antibiotic that she was started on.  Psychiatric: She has a normal mood and affect. Her behavior is normal. Judgment and thought content normal.  Nursing note and vitals reviewed.   BP (!) 151/60 (BP Location: Left Arm)   Pulse (!) 55   Temp 98 F (36.7 C) (Oral)   Ht 5\' 6"  (1.676 m)   Wt 124 lb (56.2 kg)   BMI 20.01 kg/m        Assessment & Plan:  1. Open wound of right knee, leg, and ankle with complication, subsequent encounter -Continue with saline soaks as directed  2. Skin tear of lower leg without complication, right, initial encounter -The wound appears to be healing satisfactorily. She should continue with saline soaks as directed and let the area remains open to the air while at home during the day. -Complete the antibiotic course  Patient Instructions  Continue with saline soaks at least twice daily for 20-30 minutes. May take a bath. If in the house that the wound remained open to the air If in the bed at night apply wet saline soaks with coban If an out and about during the day apply wet saline soak with Coban Call in one week for progress The patient was reassured that it is healing properly Finish antibiotic  Arrie Senate MD

## 2016-06-05 NOTE — Patient Instructions (Addendum)
Continue with saline soaks at least twice daily for 20-30 minutes. May take a bath. If in the house that the wound remained open to the air If in the bed at night apply wet saline soaks with coban If an out and about during the day apply wet saline soak with Coban Call in one week for progress The patient was reassured that it is healing properly Finish antibiotic

## 2016-06-08 ENCOUNTER — Other Ambulatory Visit: Payer: Self-pay | Admitting: Family Medicine

## 2016-06-10 ENCOUNTER — Ambulatory Visit (INDEPENDENT_AMBULATORY_CARE_PROVIDER_SITE_OTHER): Payer: Medicare Other

## 2016-06-10 DIAGNOSIS — Z23 Encounter for immunization: Secondary | ICD-10-CM | POA: Diagnosis not present

## 2016-06-10 NOTE — Addendum Note (Signed)
Addended by: Jamelle Haring on: 06/10/2016 03:27 PM   Modules accepted: Orders

## 2016-06-10 NOTE — Progress Notes (Signed)
Cardiology Office Note   Date:  06/11/2016   ID:  [name], DOB 07-26-1934, MRN [id number]  PCP:  Redge Gainer, MD  Cardiologist:   Minus Breeding, MD    Chief Complaint  Patient presents with  . Palpitations      History of Present Illness: [name] is a 82 y.o. female who presents for follow up of difficult to control with chronic diastolic CHF, and arrhythmias - seen by Dr Caryl Comes - left atrial tachycardias, that resolved on Flecainide.  She is switching her care to me as she lives in this area. She was in the hospital earlier this year for hypertensive urgency.  Since she was last seen she had stenting and PCI of her right renal artery in March.   She's done well from cardiac standpoint. She says her blood pressure is well controlled. She's not needed any clonidine. She's not noticed any palpitations, presyncope or syncope. She's had a bit of a wound on her right leg and so hasn't been walking as much. However, she's not having any cardiac complaints. She denies any chest pressure, neck or arm discomfort. She's had no shortness of breath, PND or orthopnea.  Past Medical History:  Diagnosis Date  . Anemia   . Arthritis   . Asthma   . CKD (chronic kidney disease), stage III   . Hyperlipidemia   . Hypertension   . Hypothyroidism   . Nodule of right lung    Right upper lobe  . Renal insufficiency    Chronic  . Renal vascular disease   . Right renal artery stenosis ([place]) 05/09/2015    Past Surgical History:  Procedure Laterality Date  . THYROIDECTOMY, PARTIAL       Current Outpatient Prescriptions  Medication Sig Dispense Refill  . aspirin EC 81 MG tablet Take 81 mg by mouth every other day.     Marland Kitchen atenolol-chlorthalidone (TENORETIC) 50-25 MG tablet Take 1 tablet by mouth daily.    Marland Kitchen atorvastatin (LIPITOR) 20 MG tablet 20 mg. Take 1/2 tablet by mouth daily    . calcium-vitamin D (OSCAL WITH D) 500-200 MG-UNIT per tablet Take 1 tablet by mouth daily.      .  cloNIDine (CATAPRES) 0.1 MG tablet Take 0.1 mg by mouth 2 (two) times daily.   6  . ferrous sulfate (CVS IRON) 325 (65 FE) MG tablet Take 325 mg by mouth 2 (two) times daily with a meal.    . flecainide (TAMBOCOR) 50 MG tablet Take 1 tablet (50 mg total) by mouth 2 (two) times daily. 180 tablet 3  . fluticasone (FLONASE) 50 MCG/ACT nasal spray Place 2 sprays into both nostrils daily. 16 g 0  . hydrALAZINE (APRESOLINE) 50 MG tablet Take 50 mg by mouth 2 (two) times daily.     . hydroxypropyl methylcellulose / hypromellose (ISOPTO TEARS / GONIOVISC) 2.5 % ophthalmic solution Place 1 drop into both eyes as needed for dry eyes.    Marland Kitchen levothyroxine (SYNTHROID, LEVOTHROID) 75 MCG tablet TAKE 1 TABLET (75 MCG TOTAL) BY MOUTH DAILY. 90 tablet 3  . lisinopril (PRINIVIL,ZESTRIL) 40 MG tablet Take 40 mg by mouth daily.    . Omega-3 Fatty Acids (FISH OIL) 1200 MG CAPS Take 1,200 mg by mouth daily.     No current facility-administered medications for this visit.     Allergies:   Amlodipine; Isosorbide nitrate; Sulfonamide derivatives; and Terazosin    ROS:  Please see the history of present illness.  Otherwise, review of systems are positive for none.   All other systems are reviewed and negative.    PHYSICAL EXAM: VS:  BP (!) 180/82   Pulse (!) 54   Ht 5\' 6"  (1.676 m)   Wt 127 lb (57.6 kg)   BMI 20.50 kg/m  , BMI Body mass index is 20.5 kg/m. GENERAL:  Well appearinge NECK:  No jugular venous distention, waveform within normal limits, carotid upstroke brisk and symmetric, right soft bruit, no thyromegaly LUNGS:  Clear to auscultation bilaterally BACK:  No CVA tenderness CHEST:  Unremarkable HEART:  PMI not displaced or sustained,S1 and S2 within normal limits, no S3, no S4, no clicks, no rubs, 2 out of 6 early peaking apical systolic murmur without radiation murmurs ABD:  Flat, positive bowel sounds normal in frequency in pitch, no bruits, no rebound, no guarding, no midline pulsatile mass, no  hepatomegaly, no splenomegaly EXT:  2 plus pulses throughout, no edema, no cyanosis no clubbing, healed wound on the right lower leg SKIN:  No rashes no nodules   EKG:  EKG is ordered today. The ekg ordered today demonstrates sinus rhythm, rate 54, axis within normal limits, intervals within normal limits, no acute ST-T wave changes   Recent Labs: 10/14/2015: B Natriuretic Peptide 275.0; Magnesium 1.8 10/15/2015: TSH 3.109 10/23/2015: Hemoglobin 11.2 03/10/2016: ALT 16; BUN 25; Creatinine, Ser 1.43; Potassium 3.6; Sodium 139 05/13/2016: Platelets 186     Wt Readings from Last 3 Encounters:  06/11/16 127 lb (57.6 kg)  06/11/16 124 lb (56.2 kg)  06/05/16 124 lb (56.2 kg)      Other studies Reviewed: Additional studies/ records that were reviewed today include: Renal office records. Review of the above records demonstrates:  Please see elsewhere in the note.     ASSESSMENT AND PLAN:  HFpEF -  She appears to be euvolemic. She watches her salt. No change in therapy is indicated.  Left atrial tachycardia -    She  has had no symptomatic tachyarrhythmias. She tolerates the medications and no change in therapy is indicated.  HTN -    Her blood pressure is usually well controlled at home despite being high today. I reviewed some other office records. No change in therapy is indicated.  Carotid Stenosis -   This was less than 50% bilateral in August.  We will follow again in one year.     Current medicines are reviewed at length with the patient today.  The patient does not have concerns regarding medicines.  The following changes have been made:  no change  Labs/ tests ordered today include: None  Orders Placed This Encounter  Procedures  . EKG 12-Lead     Disposition:   FU with me in one year.     Signed, Minus Breeding, MD  06/11/2016 12:39 PM    [facility]

## 2016-06-11 ENCOUNTER — Encounter: Payer: Self-pay | Admitting: Family Medicine

## 2016-06-11 ENCOUNTER — Ambulatory Visit (INDEPENDENT_AMBULATORY_CARE_PROVIDER_SITE_OTHER): Payer: Medicare Other | Admitting: Family Medicine

## 2016-06-11 ENCOUNTER — Ambulatory Visit (INDEPENDENT_AMBULATORY_CARE_PROVIDER_SITE_OTHER): Payer: Medicare Other | Admitting: Cardiology

## 2016-06-11 ENCOUNTER — Encounter: Payer: Self-pay | Admitting: Cardiology

## 2016-06-11 VITALS — BP 185/72 | HR 58 | Temp 97.0°F | Ht 66.0 in | Wt 124.0 lb

## 2016-06-11 VITALS — BP 180/82 | HR 54 | Ht 66.0 in | Wt 127.0 lb

## 2016-06-11 DIAGNOSIS — S81801D Unspecified open wound, right lower leg, subsequent encounter: Secondary | ICD-10-CM | POA: Diagnosis not present

## 2016-06-11 DIAGNOSIS — I10 Essential (primary) hypertension: Secondary | ICD-10-CM

## 2016-06-11 DIAGNOSIS — I5032 Chronic diastolic (congestive) heart failure: Secondary | ICD-10-CM | POA: Diagnosis not present

## 2016-06-11 DIAGNOSIS — S91001D Unspecified open wound, right ankle, subsequent encounter: Secondary | ICD-10-CM | POA: Diagnosis not present

## 2016-06-11 DIAGNOSIS — S81001D Unspecified open wound, right knee, subsequent encounter: Secondary | ICD-10-CM

## 2016-06-11 MED ORDER — FLECAINIDE ACETATE 50 MG PO TABS: 50.0000 mg | ORAL_TABLET | Freq: Two times a day (BID) | ORAL | 3 refills | Status: DC

## 2016-06-11 NOTE — Patient Instructions (Addendum)
Continue using saline solution for cleansing and let the wound be open to the air especially when in the home. This will allow to dry out more. Call back if any increasing redness or perceived inflammation.

## 2016-06-11 NOTE — Progress Notes (Signed)
Subjective:    Patient ID: [name], female    DOB: [birth year], 82 y.o.   MRN: [id number]  HPI Patient here today for follow up on leg wound, right lower leg. The patient is doing well and she has continued with the dressings and the saline cleansing    Patient Active Problem List   Diagnosis Date Noted  . Renal vascular disease 10/31/2015  . Nodule of right lung 10/31/2015  . Asthma without status asthmaticus 10/31/2015  . Essential hypertension, malignant   . Hypothyroidism 10/15/2015  . Pancytopenia ([place]) 10/15/2015  . Hyponatremia 10/14/2015  . Thrombocytopenia ([place]) 06/16/2015  . Right renal artery stenosis ([place]) 05/09/2015  . Chronic diastolic heart failure ([place]) 04/03/2014  . Acute diastolic heart failure ([place]) 04/03/2014  . Hypertensive urgency 04/02/2014  . Headache 04/02/2014  . CKD (chronic kidney disease) stage 3, GFR 30-59 ml/min 04/02/2014  . Paroxysmal atrial fibrillation ([place]) 01/25/2014  . Hypokalemia 07/19/2013  . Anemia 07/19/2013  . Bilateral lower extremity edema 06/30/2013  . Hypertension with fluid overload 06/30/2013  . Need for prophylactic vaccination and inoculation against influenza 05/31/2013  . Pedal edema 05/10/2013  . Varicose veins of lower extremities with other complications 24/58/0998  . ABNORMAL STRESS ELECTROCARDIOGRAM 01/23/2010  . Hyperlipidemia 12/18/2009  . Essential hypertension 12/18/2009  . RENAL INSUFFICIENCY, CHRONIC 12/18/2009  . ARTHRITIS 12/18/2009   Outpatient Encounter Prescriptions as of 06/11/2016  Medication Sig  . aspirin EC 81 MG tablet Take 81 mg by mouth every other day.   Marland Kitchen atenolol-chlorthalidone (TENORETIC) 50-25 MG tablet Take 1 tablet by mouth daily.  Marland Kitchen atorvastatin (LIPITOR) 20 MG tablet TAKE 1/2 TABLET DAILY  . calcium-vitamin D (OSCAL WITH D) 500-200 MG-UNIT per tablet Take 1 tablet by mouth daily.    . cloNIDine (CATAPRES) 0.1 MG tablet Take 0.1 mg by mouth daily.   . ferrous sulfate (CVS IRON)  325 (65 FE) MG tablet Take 325 mg by mouth 2 (two) times daily with a meal.  . flecainide (TAMBOCOR) 50 MG tablet Take 1 tablet (50 mg total) by mouth 2 (two) times daily.  . fluticasone (FLONASE) 50 MCG/ACT nasal spray Place 2 sprays into both nostrils daily.  . hydrALAZINE (APRESOLINE) 50 MG tablet Take 50 mg by mouth 2 (two) times daily.   . hydroxypropyl methylcellulose / hypromellose (ISOPTO TEARS / GONIOVISC) 2.5 % ophthalmic solution Place 1 drop into both eyes as needed for dry eyes.  Marland Kitchen levothyroxine (SYNTHROID, LEVOTHROID) 75 MCG tablet TAKE 1 TABLET (75 MCG TOTAL) BY MOUTH DAILY.  Marland Kitchen lisinopril (PRINIVIL,ZESTRIL) 40 MG tablet TAKE 1 TABLET BY MOUTH TWICE A DAY (Patient taking differently: TAKE 1 TABLET BY MOUTH once A DAY)  . Omega-3 Fatty Acids (FISH OIL) 1200 MG CAPS Take 1,200 mg by mouth daily.  . [DISCONTINUED] cephALEXin (KEFLEX) 500 MG capsule Take 1 capsule (500 mg total) by mouth 3 (three) times daily.   No facility-administered encounter medications on file as of 06/11/2016.       Review of Systems  Constitutional: Negative.   HENT: Negative.   Eyes: Negative.   Respiratory: Negative.   Cardiovascular: Negative.   Gastrointestinal: Negative.   Endocrine: Negative.   Genitourinary: Negative.   Musculoskeletal: Negative.   Skin: Positive for wound (healing right lower leg wound).  Allergic/Immunologic: Negative.   Neurological: Negative.   Hematological: Negative.   Psychiatric/Behavioral: Negative.        Objective:   Physical Exam  BP (!) 185/72 (BP Location: Right Arm)  Pulse (!) 58   Temp 97 F (36.1 C) (Oral)   Ht 5\' 6"  (1.676 m)   Wt 124 lb (56.2 kg)   BMI 20.01 kg/m  The wound of the lower leg continues to heal there is minimal redness and no drainage.      Assessment & Plan:  1. Open wound of right knee, leg, and ankle with complication, subsequent encounter -Continue to cleanse the wound with saline and let it be open to the air as much as  possible -return to the office in a couple weeks for recheck if necessary  Patient Instructions  Continue using saline solution for cleansing and let the wound be open to the air especially when in the home. This will allow to dry out more. Call back if any increasing redness or perceived inflammation.  Arrie Senate MD

## 2016-06-11 NOTE — Patient Instructions (Signed)

## 2016-06-24 ENCOUNTER — Other Ambulatory Visit: Payer: Self-pay | Admitting: Family Medicine

## 2016-06-25 ENCOUNTER — Ambulatory Visit: Payer: Medicare Other | Admitting: Family Medicine

## 2016-07-03 ENCOUNTER — Other Ambulatory Visit: Payer: Self-pay | Admitting: Family Medicine

## 2016-07-03 DIAGNOSIS — I10 Essential (primary) hypertension: Secondary | ICD-10-CM

## 2016-07-23 ENCOUNTER — Other Ambulatory Visit: Payer: Medicare Other

## 2016-07-23 DIAGNOSIS — I480 Paroxysmal atrial fibrillation: Secondary | ICD-10-CM

## 2016-07-23 DIAGNOSIS — E7800 Pure hypercholesterolemia, unspecified: Secondary | ICD-10-CM | POA: Diagnosis not present

## 2016-07-23 DIAGNOSIS — E559 Vitamin D deficiency, unspecified: Secondary | ICD-10-CM

## 2016-07-23 DIAGNOSIS — N183 Chronic kidney disease, stage 3 unspecified: Secondary | ICD-10-CM

## 2016-07-23 DIAGNOSIS — I10 Essential (primary) hypertension: Secondary | ICD-10-CM | POA: Diagnosis not present

## 2016-07-23 DIAGNOSIS — D696 Thrombocytopenia, unspecified: Secondary | ICD-10-CM

## 2016-07-24 LAB — BMP8+EGFR
BUN/Creatinine Ratio: 13 (ref 12–28)
BUN: 16 mg/dL (ref 8–27)
CHLORIDE: 94 mmol/L — AB (ref 96–106)
CO2: 29 mmol/L (ref 18–29)
Calcium: 9.3 mg/dL (ref 8.7–10.3)
Creatinine, Ser: 1.24 mg/dL — ABNORMAL HIGH (ref 0.57–1.00)
GFR calc Af Amer: 47 mL/min/{1.73_m2} — ABNORMAL LOW (ref >59)
GFR, EST NON AFRICAN AMERICAN: 41 mL/min/{1.73_m2} — AB (ref >59)
GLUCOSE: 99 mg/dL (ref 65–99)
POTASSIUM: 3.8 mmol/L (ref 3.5–5.2)
SODIUM: 138 mmol/L (ref 134–144)

## 2016-07-24 LAB — VITAMIN D 25 HYDROXY (VIT D DEFICIENCY, FRACTURES): Vit D, 25-Hydroxy: 62.8 ng/mL (ref 30.0–100.0)

## 2016-07-24 LAB — CBC WITH DIFFERENTIAL/PLATELET
BASOS ABS: 0 10*3/uL (ref 0.0–0.2)
BASOS: 1 %
EOS (ABSOLUTE): 0 10*3/uL (ref 0.0–0.4)
Eos: 1 %
Hematocrit: 34.2 % (ref 34.0–46.6)
Hemoglobin: 11.6 g/dL (ref 11.1–15.9)
IMMATURE GRANULOCYTES: 0 %
Immature Grans (Abs): 0 10*3/uL (ref 0.0–0.1)
Lymphocytes Absolute: 0.9 10*3/uL (ref 0.7–3.1)
Lymphs: 39 %
MCH: 29.9 pg (ref 26.6–33.0)
MCHC: 33.9 g/dL (ref 31.5–35.7)
MCV: 88 fL (ref 79–97)
MONOS ABS: 0.2 10*3/uL (ref 0.1–0.9)
Monocytes: 11 %
NEUTROS PCT: 48 %
Neutrophils Absolute: 1.1 10*3/uL — ABNORMAL LOW (ref 1.4–7.0)
PLATELETS: 103 10*3/uL — AB (ref 150–379)
RBC: 3.88 x10E6/uL (ref 3.77–5.28)
RDW: 14 % (ref 12.3–15.4)
WBC: 2.2 10*3/uL — AB (ref 3.4–10.8)

## 2016-07-24 LAB — HEPATIC FUNCTION PANEL
ALT: 18 IU/L (ref 0–32)
AST: 23 IU/L (ref 0–40)
Albumin: 4.2 g/dL (ref 3.5–4.7)
Alkaline Phosphatase: 67 IU/L (ref 39–117)
BILIRUBIN TOTAL: 0.5 mg/dL (ref 0.0–1.2)
Bilirubin, Direct: 0.16 mg/dL (ref 0.00–0.40)
Total Protein: 6.7 g/dL (ref 6.0–8.5)

## 2016-07-24 LAB — LIPID PANEL
CHOLESTEROL TOTAL: 157 mg/dL (ref 100–199)
Chol/HDL Ratio: 2.7 ratio units (ref 0.0–4.4)
HDL: 59 mg/dL (ref >39)
LDL CALC: 81 mg/dL (ref 0–99)
TRIGLYCERIDES: 84 mg/dL (ref 0–149)
VLDL Cholesterol Cal: 17 mg/dL (ref 5–40)

## 2016-07-31 ENCOUNTER — Encounter: Payer: Self-pay | Admitting: Family Medicine

## 2016-07-31 ENCOUNTER — Ambulatory Visit (INDEPENDENT_AMBULATORY_CARE_PROVIDER_SITE_OTHER): Payer: Medicare Other | Admitting: Family Medicine

## 2016-07-31 VITALS — BP 198/80 | HR 56 | Temp 97.1°F | Ht 66.0 in | Wt 130.0 lb

## 2016-07-31 DIAGNOSIS — I10 Essential (primary) hypertension: Secondary | ICD-10-CM

## 2016-07-31 DIAGNOSIS — E559 Vitamin D deficiency, unspecified: Secondary | ICD-10-CM

## 2016-07-31 DIAGNOSIS — N183 Chronic kidney disease, stage 3 unspecified: Secondary | ICD-10-CM

## 2016-07-31 DIAGNOSIS — D72819 Decreased white blood cell count, unspecified: Secondary | ICD-10-CM | POA: Diagnosis not present

## 2016-07-31 DIAGNOSIS — E7800 Pure hypercholesterolemia, unspecified: Secondary | ICD-10-CM | POA: Diagnosis not present

## 2016-07-31 DIAGNOSIS — D696 Thrombocytopenia, unspecified: Secondary | ICD-10-CM | POA: Diagnosis not present

## 2016-07-31 DIAGNOSIS — I480 Paroxysmal atrial fibrillation: Secondary | ICD-10-CM | POA: Diagnosis not present

## 2016-07-31 NOTE — Patient Instructions (Addendum)
Medicare Annual Wellness Visit  [place] and the medical providers at [facility] strive to bring you the best medical care.  In doing so we not only want to address your current medical conditions and concerns but also to detect new conditions early and prevent illness, disease and health-related problems.    Medicare offers a yearly Wellness Visit which allows our clinical staff to assess your need for preventative services including immunizations, lifestyle education, counseling to decrease risk of preventable diseases and screening for fall risk and other medical concerns.    This visit is provided free of charge (no copay) for all Medicare recipients. The clinical pharmacists at [facility] have begun to conduct these Wellness Visits which will also include a thorough review of all your medications.    As you primary medical provider recommend that you make an appointment for your Annual Wellness Visit if you have not done so already this year.  You may set up this appointment before you leave today or you may call back ([phone]) and schedule an appointment.  Please make sure when you call that you mention that you are scheduling your Annual Wellness Visit with the clinical pharmacist so that the appointment may be made for the proper length of time.     Continue current medications. Continue good therapeutic lifestyle changes which include good diet and exercise. Fall precautions discussed with patient. If an FOBT was given today- please return it to our front desk. If you are over 39 years old - you may need Prevnar 41 or the adult Pneumonia vaccine.  **Flu shots are available--- please call and schedule a FLU-CLINIC appointment**  After your visit with [place] today you will receive a survey in the mail or online from [employer] regarding your care with [place]. Please take a moment to fill this out. Your feedback is very  important to [place] as you can help [place] better understand your patient needs as well as improve your experience and satisfaction. WE CARE ABOUT YOU!!!  We will arrange for you to have an appointment with the hematologist because of the low white blood cell count and the low platelet count. Continue to follow-up with nephrology After blood pressures at home and bring readings to your visits with [place] when you come each time. Because of the increased bruising, you may reduce the baby aspirin to Monday Wednesday and Friday Continue with the iron is doing for your anemia

## 2016-07-31 NOTE — Progress Notes (Signed)
Subjective:    Patient ID: [name], female    DOB: 03/08/1934, 82 y.o.   MRN: [id number]  HPI  Pt here for follow up and management of chronic medical problems which includes hyperlipidemia and hypertension. She is taking medications regularly.The patient has had recent lab work and this will be reviewed with her during the visit today. Her cholesterol numbers were all good and at goal. Her blood sugar was good and her creatinine though elevated was not as high as it was in the past and this is also good. The electrolytes are good except the chloride was minimally decreased. The CBC had a stable hemoglobin at 11.6 with a white blood cell count that remains low at 2.2. Platelet count was also diminished. All liver function tests were normal. Vitamin D level was good at 62.8. It was already decided when I review the lab work that I want her to see a hematologist about the persistent low white blood cell count. If she has not done that we will make arrangements to make this happen. She continues to be followed by the nephrologist because of her hypertension and chronic kidney disease. The patient sees Dr. Mercy Harmonee Tozer. She also sees the cardiologist periodically. She's had trouble with her blood pressures being elevated frequently and they're usually much better at home. We will arrange for her to see the hematologist and she agrees with doing this. She denies any chest pain or shortness of breath anymore than usual. She walks when she can inside of buildings now because of the cold weather. She denies any trouble with her stomach including nausea vomiting diarrhea or blood in the stool. Her stools are dark because she is continuing to take iron for her anemia. She is having no problems passing her water.The patient says that her blood pressures at home usually run in the 1:30 to 150 range over 60-70. She is followed regularly by the nephrologist and we will have to be content with these readings. She  denies any chest pain or shortness of breath anymore than usual. She denies any trouble with her intestinal tract including nausea vomiting diarrhea or blood in the stool as noted above.    Patient Active Problem List   Diagnosis Date Noted  . Renal vascular disease 10/31/2015  . Nodule of right lung 10/31/2015  . Asthma without status asthmaticus 10/31/2015  . Essential hypertension, malignant   . Hypothyroidism 10/15/2015  . Pancytopenia ([place]) 10/15/2015  . Hyponatremia 10/14/2015  . Thrombocytopenia ([place]) 06/16/2015  . Right renal artery stenosis ([place]) 05/09/2015  . Chronic diastolic heart failure ([place]) 04/03/2014  . Acute diastolic heart failure ([place]) 04/03/2014  . Hypertensive urgency 04/02/2014  . Headache 04/02/2014  . CKD (chronic kidney disease) stage 3, GFR 30-59 ml/min 04/02/2014  . Paroxysmal atrial fibrillation ([place]) 01/25/2014  . Hypokalemia 07/19/2013  . Anemia 07/19/2013  . Bilateral lower extremity edema 06/30/2013  . Hypertension with fluid overload 06/30/2013  . Need for prophylactic vaccination and inoculation against influenza 05/31/2013  . Pedal edema 05/10/2013  . Varicose veins of lower extremities with other complications 35/00/9381  . ABNORMAL STRESS ELECTROCARDIOGRAM 01/23/2010  . Hyperlipidemia 12/18/2009  . Essential hypertension 12/18/2009  . RENAL INSUFFICIENCY, CHRONIC 12/18/2009  . ARTHRITIS 12/18/2009   Outpatient Encounter Prescriptions as of 07/31/2016  Medication Sig  . aspirin EC 81 MG tablet Take 81 mg by mouth every other day.   Marland Kitchen atenolol-chlorthalidone (TENORETIC) 50-25 MG tablet TAKE 1 TABLET BY MOUTH DAILY.  Marland Kitchen atorvastatin (  LIPITOR) 20 MG tablet 20 mg. Take 1/2 tablet by mouth daily  . calcium-vitamin D (OSCAL WITH D) 500-200 MG-UNIT per tablet Take 1 tablet by mouth daily.    . cloNIDine (CATAPRES) 0.1 MG tablet Take 0.1 mg by mouth 2 (two) times daily.   . ferrous sulfate ([employer] IRON) 325 (65 FE) MG tablet Take 325 mg by mouth 2  (two) times daily with a meal.  . flecainide (TAMBOCOR) 50 MG tablet Take 1 tablet (50 mg total) by mouth 2 (two) times daily.  . hydrALAZINE (APRESOLINE) 50 MG tablet Take 50 mg by mouth 2 (two) times daily.   . hydroxypropyl methylcellulose / hypromellose (ISOPTO TEARS / GONIOVISC) 2.5 % ophthalmic solution Place 1 drop into both eyes as needed for dry eyes.  Marland Kitchen levothyroxine (SYNTHROID, LEVOTHROID) 75 MCG tablet TAKE 1 TABLET (75 MCG TOTAL) BY MOUTH DAILY.  Marland Kitchen lisinopril (PRINIVIL,ZESTRIL) 40 MG tablet Take 40 mg by mouth daily.  . Omega-3 Fatty Acids (FISH OIL) 1200 MG CAPS Take 1,200 mg by mouth daily.  . [DISCONTINUED] atenolol-chlorthalidone (TENORETIC) 50-25 MG tablet Take 1 tablet by mouth daily.  . [DISCONTINUED] fluticasone (FLONASE) 50 MCG/ACT nasal spray PLACE 2 SPRAYS INTO BOTH NOSTRILS DAILY.   No facility-administered encounter medications on file as of 07/31/2016.      Review of Systems  Constitutional: Negative.   HENT: Negative.   Eyes: Negative.   Respiratory: Negative.   Cardiovascular: Negative.   Gastrointestinal: Negative.   Endocrine: Negative.   Genitourinary: Negative.   Musculoskeletal: Negative.   Skin: Negative.   Allergic/Immunologic: Negative.   Neurological: Negative.   Hematological: Negative.   Psychiatric/Behavioral: Negative.        Objective:   Physical Exam  Constitutional: She is oriented to person, place, and time. She appears well-developed and well-nourished.  Pleasant and alert  HENT:  Head: Normocephalic and atraumatic.  Right Ear: External ear normal.  Left Ear: External ear normal.  Nose: Nose normal.  Mouth/Throat: Oropharynx is clear and moist.  Eyes: Conjunctivae and EOM are normal. Pupils are equal, round, and reactive to light. Right eye exhibits no discharge. Left eye exhibits no discharge. No scleral icterus.  Neck: Normal range of motion. Neck supple. No thyromegaly present.  There are bilateral bruits and the patient has  had Dopplers recently and showed carotid stenosis bilaterally of no more than 49%. She will continue to get these yearly.  Cardiovascular: Normal rate, regular rhythm, normal heart sounds and intact distal pulses.   No murmur heard. The heart had a regular rate and rhythm at 72/m  Pulmonary/Chest: Effort normal and breath sounds normal. No respiratory distress. She has no wheezes. She has no rales.  Clear anteriorly and posteriorly  Abdominal: Soft. Bowel sounds are normal. She exhibits no mass. There is no tenderness. There is no rebound and no guarding.  No liver or spleen enlargement no bruits and no masses.  Musculoskeletal: Normal range of motion. She exhibits no edema.  Lymphadenopathy:    She has no cervical adenopathy.  Neurological: She is alert and oriented to person, place, and time. She has normal reflexes. No cranial nerve deficit.  Skin: Skin is warm and dry. No rash noted.  The patient has a lot of bruising on both hands and we will reduce her baby aspirin to 3 times weekly.  Psychiatric: She has a normal mood and affect. Her behavior is normal. Judgment and thought content normal.  Nursing note and vitals reviewed.   BP (!) 168/61 (BP  Location: Left Arm)   Pulse (!) 56   Temp 97.1 F (36.2 C) (Oral)   Ht 5\' 6"  (1.676 m)   Wt 130 lb (59 kg)   BMI 20.98 kg/m         Assessment & Plan:  1. Essential hypertension -The blood pressure in this patient remains elevated. I believe there is a component of whitecoat hypertension. She does see the nephrologist regularly and he is content with her blood pressures that she gets at home and we will have to be contented also. No change in treatment today.  2. CKD (chronic kidney disease) stage 3, GFR 30-59 ml/min -Continue to follow-up with Dr. Mercy Cordon Gassett as planned  3. Vitamin D deficiency -Continue current treatment  4. Pure hypercholesterolemia -Continue current treatment and aggressive therapeutic lifestyle changes  5.  Paroxysmal atrial fibrillation (HCC) -Continue to follow-up with cardiology  6. Leukopenia, unspecified type -The white count remains low and this may be due to medication but we will get an evaluation from the hematologist. - Ambulatory referral to Hematology / Oncology  7. Platelets decreased ([place]) - Ambulatory referral to Hematology / Oncology  8. Thrombocytopenia ([place]) -Hematology evaluation  No orders of the defined types were placed in this encounter.  Patient Instructions                       Medicare Annual Wellness Visit  [facility] and the medical providers at [facility] strive to bring you the best medical care.  In doing so we not only want to address your current medical conditions and concerns but also to detect new conditions early and prevent illness, disease and health-related problems.    Medicare offers a yearly Wellness Visit which allows our clinical staff to assess your need for preventative services including immunizations, lifestyle education, counseling to decrease risk of preventable diseases and screening for fall risk and other medical concerns.    This visit is provided free of charge (no copay) for all Medicare recipients. The clinical pharmacists at [facility] have begun to conduct these Wellness Visits which will also include a thorough review of all your medications.    As you primary medical provider recommend that you make an appointment for your Annual Wellness Visit if you have not done so already this year.  You may set up this appointment before you leave today or you may call back ([phone]) and schedule an appointment.  Please make sure when you call that you mention that you are scheduling your Annual Wellness Visit with the clinical pharmacist so that the appointment may be made for the proper length of time.     Continue current medications. Continue good therapeutic lifestyle changes which  include good diet and exercise. Fall precautions discussed with patient. If an FOBT was given today- please return it to our front desk. If you are over 66 years old - you may need Prevnar 71 or the adult Pneumonia vaccine.  **Flu shots are available--- please call and schedule a FLU-CLINIC appointment**  After your visit with [place] today you will receive a survey in the mail or online from [employer] regarding your care with [place]. Please take a moment to fill this out. Your feedback is very important to [place] as you can help [place] better understand your patient needs as well as improve your experience and satisfaction. WE CARE ABOUT YOU!!!  We will arrange for you to have an appointment with the  hematologist because of the low white blood cell count and the low platelet count. Continue to follow-up with nephrology After blood pressures at home and bring readings to your visits with [place] when you come each time. Because of the increased bruising, you may reduce the baby aspirin to Monday Wednesday and Friday Continue with the iron is doing for your anemia    Arrie Senate MD

## 2016-08-15 ENCOUNTER — Emergency Department (HOSPITAL_COMMUNITY)
Admission: EM | Admit: 2016-08-15 | Discharge: 2016-08-16 | Disposition: A | Discharge status: Home / Self Care | Payer: Medicare Other | Attending: Emergency Medicine | Admitting: Emergency Medicine

## 2016-08-15 ENCOUNTER — Emergency Department (HOSPITAL_COMMUNITY): Payer: Medicare Other

## 2016-08-15 ENCOUNTER — Encounter (HOSPITAL_COMMUNITY): Payer: Self-pay | Admitting: Emergency Medicine

## 2016-08-15 DIAGNOSIS — N183 Chronic kidney disease, stage 3 (moderate): Secondary | ICD-10-CM | POA: Insufficient documentation

## 2016-08-15 DIAGNOSIS — I159 Secondary hypertension, unspecified: Secondary | ICD-10-CM | POA: Insufficient documentation

## 2016-08-15 DIAGNOSIS — I130 Hypertensive heart and chronic kidney disease with heart failure and stage 1 through stage 4 chronic kidney disease, or unspecified chronic kidney disease: Secondary | ICD-10-CM | POA: Diagnosis not present

## 2016-08-15 DIAGNOSIS — I5032 Chronic diastolic (congestive) heart failure: Secondary | ICD-10-CM | POA: Diagnosis not present

## 2016-08-15 DIAGNOSIS — E039 Hypothyroidism, unspecified: Secondary | ICD-10-CM | POA: Diagnosis not present

## 2016-08-15 DIAGNOSIS — R51 Headache: Secondary | ICD-10-CM | POA: Diagnosis present

## 2016-08-15 DIAGNOSIS — Z79899 Other long term (current) drug therapy: Secondary | ICD-10-CM | POA: Diagnosis not present

## 2016-08-15 DIAGNOSIS — Z7982 Long term (current) use of aspirin: Secondary | ICD-10-CM | POA: Diagnosis not present

## 2016-08-15 DIAGNOSIS — J45909 Unspecified asthma, uncomplicated: Secondary | ICD-10-CM | POA: Insufficient documentation

## 2016-08-15 DIAGNOSIS — R42 Dizziness and giddiness: Secondary | ICD-10-CM | POA: Diagnosis not present

## 2016-08-15 LAB — COMPREHENSIVE METABOLIC PANEL
ALBUMIN: 4.2 g/dL (ref 3.5–5.0)
ALK PHOS: 53 U/L (ref 38–126)
ALT: 22 U/L (ref 14–54)
ANION GAP: 9 (ref 5–15)
AST: 29 U/L (ref 15–41)
BUN: 20 mg/dL (ref 6–20)
CALCIUM: 9.2 mg/dL (ref 8.9–10.3)
CO2: 31 mmol/L (ref 22–32)
Chloride: 91 mmol/L — ABNORMAL LOW (ref 101–111)
Creatinine, Ser: 1.3 mg/dL — ABNORMAL HIGH (ref 0.44–1.00)
GFR calc non Af Amer: 38 mL/min — ABNORMAL LOW (ref >60)
GFR, EST AFRICAN AMERICAN: 44 mL/min — AB (ref >60)
GLUCOSE: 138 mg/dL — AB (ref 65–99)
POTASSIUM: 3.3 mmol/L — AB (ref 3.5–5.1)
SODIUM: 131 mmol/L — AB (ref 135–145)
TOTAL PROTEIN: 6.7 g/dL (ref 6.5–8.1)
Total Bilirubin: 0.8 mg/dL (ref 0.3–1.2)

## 2016-08-15 LAB — CBC WITH DIFFERENTIAL/PLATELET
BASOS PCT: 0 %
Basophils Absolute: 0 10*3/uL (ref 0.0–0.1)
EOS ABS: 0 10*3/uL (ref 0.0–0.7)
EOS PCT: 0 %
HCT: 33.6 % — ABNORMAL LOW (ref 36.0–46.0)
HEMOGLOBIN: 11.6 g/dL — AB (ref 12.0–15.0)
LYMPHS ABS: 0.6 10*3/uL — AB (ref 0.7–4.0)
Lymphocytes Relative: 16 %
MCH: 30.4 pg (ref 26.0–34.0)
MCHC: 34.5 g/dL (ref 30.0–36.0)
MCV: 88.2 fL (ref 78.0–100.0)
MONO ABS: 0.3 10*3/uL (ref 0.1–1.0)
MONOS PCT: 7 %
Neutro Abs: 2.9 10*3/uL (ref 1.7–7.7)
Neutrophils Relative %: 77 %
Platelets: 94 10*3/uL — ABNORMAL LOW (ref 150–400)
RBC: 3.81 MIL/uL — ABNORMAL LOW (ref 3.87–5.11)
RDW: 13.4 % (ref 11.5–15.5)
WBC: 3.7 10*3/uL — ABNORMAL LOW (ref 4.0–10.5)

## 2016-08-15 LAB — TROPONIN I

## 2016-08-15 MED ORDER — HYDRALAZINE HCL 20 MG/ML IJ SOLN
5.0000 mg | Freq: Once | INTRAMUSCULAR | Status: AC
  Administered 2016-08-15: 5 mg via INTRAVENOUS
  Filled 2016-08-15: qty 1

## 2016-08-15 NOTE — ED Provider Notes (Signed)
[facility] DEPT Provider Note   CSN: [id number] Arrival date & time: 08/15/16  2044 By signing my name below, I, [name], attest that this documentation has been prepared under the direction and in the presence of Janet Pew, MD . Electronically Signed: Dyke [name], Scribe. 08/15/2016. 9:35 PM.   History   Chief Complaint Chief Complaint  Patient presents with  . Hypertension   HPI [name] is a 82 y.o. female with a hx of HTN, hypertensive urgency, chronic diastolic heart failure, and CKD stage III who presents to the Emergency Department complaining of persistent, gradually worsening hypertension which began one week ago.  Pt states she hasn't "felt good" for one week. She is currently on 4 BP medications, and was told to increase clonidine 3x per day. She has taken 4 clonidine today with no relief. She notes associated nausea, vomiting, headache, neck pain and lightheadedness. Pt's daugher states that last week she threw up for about 2 hours.  Lightheadedness is exacerbated by movement. Per pt, she feels the same as in 2/17 when she had renal stents placed. Slowly over the last 8 months, pt has increased taking her blood pressure medications after having stent placed. She states she is on about the same amount as she was prior to her stents. The highest her BP has been was tonight in the ED, 212/70. Her BP at home before coming to the ED was 211. Pt denies any recent fall or trauma. She denies any CP or SOB.  The history is provided by the patient. No language interpreter was used.    Past Medical History:  Diagnosis Date  . Anemia   . Arthritis   . Asthma   . CKD (chronic kidney disease), stage III   . Hyperlipidemia   . Hypertension   . Hypothyroidism   . Nodule of right lung    Right upper lobe  . Renal insufficiency    Chronic  . Renal vascular disease   . Right renal artery stenosis ([place]) 05/09/2015    Patient Active Problem List   Diagnosis Date  Noted  . Renal vascular disease 10/31/2015  . Nodule of right lung 10/31/2015  . Asthma without status asthmaticus 10/31/2015  . Essential hypertension, malignant   . Hypothyroidism 10/15/2015  . Pancytopenia ([place]) 10/15/2015  . Hyponatremia 10/14/2015  . Thrombocytopenia ([place]) 06/16/2015  . Right renal artery stenosis ([place]) 05/09/2015  . Chronic diastolic heart failure ([place]) 04/03/2014  . Acute diastolic heart failure ([place]) 04/03/2014  . Hypertensive urgency 04/02/2014  . Headache 04/02/2014  . CKD (chronic kidney disease) stage 3, GFR 30-59 ml/min 04/02/2014  . Paroxysmal atrial fibrillation ([place]) 01/25/2014  . Hypokalemia 07/19/2013  . Anemia 07/19/2013  . Bilateral lower extremity edema 06/30/2013  . Hypertension with fluid overload 06/30/2013  . Need for prophylactic vaccination and inoculation against influenza 05/31/2013  . Pedal edema 05/10/2013  . Varicose veins of lower extremities with other complications 27/74/1287  . ABNORMAL STRESS ELECTROCARDIOGRAM 01/23/2010  . Hyperlipidemia 12/18/2009  . Essential hypertension 12/18/2009  . RENAL INSUFFICIENCY, CHRONIC 12/18/2009  . ARTHRITIS 12/18/2009    Past Surgical History:  Procedure Laterality Date  . THYROIDECTOMY, PARTIAL      OB History    No data available       Home Medications    Prior to Admission medications   Medication Sig Start Date End Date Taking? Authorizing Provider  aspirin EC 81 MG tablet Take 81 mg by mouth every other day.  Yes Historical Provider, MD  atenolol-chlorthalidone (TENORETIC) 50-25 MG tablet TAKE 1 TABLET BY MOUTH DAILY. 07/03/16  Yes Chipper Herb, MD  atorvastatin (LIPITOR) 20 MG tablet Take 10 mg by mouth at bedtime. Take 1/2 tablet by mouth daily    Yes Historical Provider, MD  calcium-vitamin D (OSCAL WITH D) 500-200 MG-UNIT per tablet Take 1 tablet by mouth daily.     Yes Historical Provider, MD  cloNIDine (CATAPRES) 0.1 MG tablet Take 0.1 mg by mouth daily. May take one  additional tablet daily as needed. 09/13/14  Yes Historical Provider, MD  ferrous sulfate ([employer]S IRON) 325 (65 FE) MG tablet Take 325 mg by mouth 2 (two) times daily with a meal.   Yes Historical Provider, MD  flecainide (TAMBOCOR) 50 MG tablet Take 1 tablet (50 mg total) by mouth 2 (two) times daily. 06/11/16  Yes Minus Breeding, MD  hydrALAZINE (APRESOLINE) 50 MG tablet Take 50 mg by mouth 2 (two) times daily.    Yes Historical Provider, MD  hydroxypropyl methylcellulose / hypromellose (ISOPTO TEARS / GONIOVISC) 2.5 % ophthalmic solution Place 1 drop into both eyes as needed for dry eyes.   Yes Historical Provider, MD  levothyroxine (SYNTHROID, LEVOTHROID) 75 MCG tablet TAKE 1 TABLET (75 MCG TOTAL) BY MOUTH DAILY. 11/26/15  Yes Chipper Herb, MD  lisinopril (PRINIVIL,ZESTRIL) 40 MG tablet Take 40 mg by mouth daily.   Yes Historical Provider, MD  Omega-3 Fatty Acids (FISH OIL) 1200 MG CAPS Take 1,200 mg by mouth daily.   Yes Historical Provider, MD  polyethylene glycol powder (GLYCOLAX/MIRALAX) powder Take 17 g by mouth at bedtime.   Yes Historical Provider, MD    Family History Family History  Problem Relation Age of Onset  . Thyroid disease Mother   . Stroke Father   . Hypertension Brother     Social History Social History  Substance Use Topics  . Smoking status: Never Smoker  . Smokeless tobacco: Never Used     Comment: Pt denies cigarettes  . Alcohol use No     Allergies   Amlodipine; Isosorbide nitrate; Sulfonamide derivatives; and Terazosin   Review of Systems Review of Systems  Respiratory: Negative for shortness of breath.   Cardiovascular: Negative for chest pain.  Gastrointestinal: Positive for nausea and vomiting.  Musculoskeletal: Positive for neck pain.  Neurological: Positive for light-headedness and headaches.  All other systems reviewed and are negative.  Physical Exam Updated Vital Signs BP (!) 156/54   Pulse (!) 55   Temp 97.5 F (36.4 C) (Oral)   Resp  13   Ht 5\' 6"  (1.676 m)   Wt 130 lb (59 kg)   SpO2 95%   BMI 20.98 kg/m   Physical Exam  Constitutional: She is oriented to person, place, and time. She appears well-developed and well-nourished. No distress.  HENT:  Head: Normocephalic and atraumatic.  Eyes: Conjunctivae are normal.  Cardiovascular: Bradycardia present.   Sinus bradycardic   Pulmonary/Chest: Breath sounds normal. No respiratory distress. She has no wheezes. She has no rales.  Abdominal: Soft. Bowel sounds are normal. She exhibits no distension and no mass. There is no tenderness. There is no rebound and no guarding.  Neurological: She is alert and oriented to person, place, and time. No cranial nerve deficit.  Cranial nerves intact. Hyperreflexic, but the same throughout   Skin: Skin is warm and dry.  Psychiatric: She has a normal mood and affect.  Nursing note and vitals reviewed.  ED Treatments / Results  DIAGNOSTIC STUDIES:  Oxygen Saturation is 98% on RA, normal by my interpretation.    COORDINATION OF CARE:  9:29 PM Discussed treatment plan which includes CT head with pt at bedside and pt agreed to plan.   Labs (all labs ordered are listed, but only abnormal results are displayed) Labs Reviewed  CBC WITH DIFFERENTIAL/PLATELET - Abnormal; Notable for the following:       Result Value   WBC 3.7 (*)    RBC 3.81 (*)    Hemoglobin 11.6 (*)    HCT 33.6 (*)    Platelets 94 (*)    Lymphs Abs 0.6 (*)    All other components within normal limits  COMPREHENSIVE METABOLIC PANEL - Abnormal; Notable for the following:    Sodium 131 (*)    Potassium 3.3 (*)    Chloride 91 (*)    Glucose, Bld 138 (*)    Creatinine, Ser 1.30 (*)    GFR calc non Af Amer 38 (*)    GFR calc Af Amer 44 (*)    All other components within normal limits  TROPONIN I  URINALYSIS, ROUTINE W REFLEX MICROSCOPIC    EKG  EKG Interpretation  Date/Time:  Friday August 15 2016 21:13:56 EST Ventricular Rate:  48 PR Interval:    QRS  Duration: 108 QT Interval:  527 QTC Calculation: 471 R Axis:   83 Text Interpretation:  Sinus bradycardia Borderline right axis deviation RSR' in V1 or V2, probably normal variant Probable left ventricular hypertrophy slightly slower than previous Confirmed by [facility] MD, Skylen Spiering [phone]) on 08/15/2016 10:54:55 PM       Radiology Ct Head Wo Contrast  Result Date: 08/15/2016 CLINICAL DATA:  Elevated blood pressure.  Lightheadedness. EXAM: CT HEAD WITHOUT CONTRAST TECHNIQUE: Contiguous axial images were obtained from the base of the skull through the vertex without intravenous contrast. COMPARISON:  Head CT 10/23/2015 FINDINGS: Brain: No evidence of acute infarction, hemorrhage, hydrocephalus, extra-axial collection or mass lesion/mass effect. Age related atrophy and chronic small vessel ischemia. Vascular: Atherosclerosis of skullbase vasculature without hyperdense vessel or abnormal calcification. Skull: Normal. Negative for fracture or focal lesion. Sinuses/Orbits: Paranasal sinuses and mastoid air cells are clear. The visualized orbits are unremarkable. Other: None. IMPRESSION: No acute intracranial abnormality.  No intracranial bleed. Electronically Signed   By: Jeb Levering M.D.   On: 08/15/2016 22:32    Procedures Procedures (including critical care time)  Medications Ordered in ED Medications  hydrALAZINE (APRESOLINE) injection 5 mg (5 mg Intravenous Given 08/15/16 2151)     Initial Impression / Assessment and Plan / ED Course  I have reviewed the triage vital signs and the nursing notes.  Pertinent labs & imaging results that were available during my care of the patient were reviewed by me and considered in my medical decision making (see chart for details).  Clinical Course     82 year old female with hypertension. Improved with hydralazine. Had a headache so CT was done in nature no head bleed or other causes and it was okay. Patient asymptomatic at time of discharge. [facility] her clonidine which she will do. She will also follow up with her primary doctor or nephrologist next week for further blood pressure control and investigations. No evidence of end organ damage here.   Final Clinical Impressions(s) / ED Diagnoses   Final diagnoses:  Secondary hypertension    New Prescriptions Discharge Medication List as of 08/15/2016 11:42 PM      I  personally performed the services described in this documentation, which was scribed in my presence. The recorded information has been reviewed and is accurate.     Janet Pew, MD 08/16/16 [phone]

## 2016-08-15 NOTE — ED Triage Notes (Signed)
Per pt she has a hx of HTN and was put on Clonidine once a day and then up to 3 times a day as needed.  For past week pt has had high BP and Clonidine not helping.  Pt states she feels lightheaded today

## 2016-08-21 ENCOUNTER — Other Ambulatory Visit (HOSPITAL_COMMUNITY): Payer: Self-pay | Admitting: Nephrology

## 2016-08-21 DIAGNOSIS — I129 Hypertensive chronic kidney disease with stage 1 through stage 4 chronic kidney disease, or unspecified chronic kidney disease: Secondary | ICD-10-CM

## 2016-08-21 DIAGNOSIS — N2889 Other specified disorders of kidney and ureter: Secondary | ICD-10-CM

## 2016-08-22 ENCOUNTER — Encounter (HOSPITAL_COMMUNITY): Payer: PPO | Attending: Hematology & Oncology | Admitting: Hematology & Oncology

## 2016-08-22 ENCOUNTER — Encounter (HOSPITAL_COMMUNITY): Payer: Self-pay | Admitting: Hematology & Oncology

## 2016-08-22 VITALS — BP 196/54 | HR 57 | Temp 97.9°F | Resp 18 | Ht 66.0 in | Wt 128.8 lb

## 2016-08-22 DIAGNOSIS — N183 Chronic kidney disease, stage 3 unspecified: Secondary | ICD-10-CM

## 2016-08-22 DIAGNOSIS — I5032 Chronic diastolic (congestive) heart failure: Secondary | ICD-10-CM

## 2016-08-22 DIAGNOSIS — D61818 Other pancytopenia: Secondary | ICD-10-CM

## 2016-08-22 DIAGNOSIS — D72810 Lymphocytopenia: Secondary | ICD-10-CM

## 2016-08-22 DIAGNOSIS — D696 Thrombocytopenia, unspecified: Secondary | ICD-10-CM | POA: Diagnosis not present

## 2016-08-22 DIAGNOSIS — D473 Essential (hemorrhagic) thrombocythemia: Secondary | ICD-10-CM | POA: Diagnosis not present

## 2016-08-22 DIAGNOSIS — D72819 Decreased white blood cell count, unspecified: Secondary | ICD-10-CM | POA: Diagnosis not present

## 2016-08-22 LAB — CBC WITH DIFFERENTIAL/PLATELET
BASOS PCT: 0 %
Basophils Absolute: 0 10*3/uL (ref 0.0–0.1)
Eosinophils Absolute: 0 10*3/uL (ref 0.0–0.7)
Eosinophils Relative: 1 %
HCT: 36.7 % (ref 36.0–46.0)
Hemoglobin: 12.8 g/dL (ref 12.0–15.0)
LYMPHS ABS: 1 10*3/uL (ref 0.7–4.0)
Lymphocytes Relative: 34 %
MCH: 30.6 pg (ref 26.0–34.0)
MCHC: 34.9 g/dL (ref 30.0–36.0)
MCV: 87.8 fL (ref 78.0–100.0)
MONO ABS: 0.3 10*3/uL (ref 0.1–1.0)
MONOS PCT: 11 %
NEUTROS ABS: 1.6 10*3/uL — AB (ref 1.7–7.7)
Neutrophils Relative %: 54 %
Platelets: 134 10*3/uL — ABNORMAL LOW (ref 150–400)
RBC: 4.18 MIL/uL (ref 3.87–5.11)
RDW: 13.2 % (ref 11.5–15.5)
WBC: 3 10*3/uL — ABNORMAL LOW (ref 4.0–10.5)

## 2016-08-22 LAB — FERRITIN: FERRITIN: 126 ng/mL (ref 11–307)

## 2016-08-22 LAB — IRON AND TIBC
Iron: 51 ug/dL (ref 28–170)
SATURATION RATIOS: 15 % (ref 10.4–31.8)
TIBC: 335 ug/dL (ref 250–450)
UIBC: 284 ug/dL

## 2016-08-22 LAB — VITAMIN B12: Vitamin B-12: 835 pg/mL (ref 180–914)

## 2016-08-22 LAB — SEDIMENTATION RATE: Sed Rate: 30 mm/hr — ABNORMAL HIGH (ref 0–22)

## 2016-08-22 LAB — FOLATE: Folate: 46.5 ng/mL (ref >5.9)

## 2016-08-22 NOTE — Progress Notes (Signed)
[name] presented for [employer]. Labs per MD order drawn via Peripheral Line 23 gauge needle inserted in lt ac Good blood return present. Procedure without incident.  Needle removed intact. Patient tolerated procedure well.

## 2016-08-22 NOTE — Progress Notes (Signed)
[place]  CONSULT NOTE  Patient Care Team: Chipper Herb, MD as PCP - General (Family Medicine)  CHIEF COMPLAINTS/PURPOSE OF CONSULTATION:  Leukopenia and thrombocytopenia   HISTORY OF PRESENTING ILLNESS:  [name] 83 y.o. female is here because of leukopenia and thrombocytopenia. She has CKD and follows with Dr. Mercy Moore. She notes that Dr. Laurance Flatten has referred her today for further evaluation of her blood count abnormalities.   [name] presents to the clinic today unaccompanied. I have reviewed the labs with the patient.   She bruises very easily. "I can't touch anything without it bruising me". She takes an 81 mg aspirin a day. She is not on any other blood thinners.   She is currently on 4 BP medications. She has been taking these for a few years now. No new medications in the last year.  She has small scabs that show up in her nose. She has been using nasal spray, but it hasn't been helping much. She thinks the weather has been drying out her nose.   Denies blood in stool, hematuria, or weight loss. Her appetite has been normal. She has never had any problems with her liver, no known liver disease.   Over the spring and summer she had heart palpitations every 4 weeks, but those are gone now. She has chronic diastolic CHF, and arrhythmias  - left atrial tachycardias, that resolved on Flecainide.She sees Dr. Percival Spanish. She last saw him on 06/11/16.   MEDICAL HISTORY:  Past Medical History:  Diagnosis Date  . Anemia   . Arthritis   . Asthma   . CKD (chronic kidney disease), stage III   . Hyperlipidemia   . Hypertension   . Hypothyroidism   . Nodule of right lung    Right upper lobe  . Renal insufficiency    Chronic  . Renal vascular disease   . Right renal artery stenosis ([place]) 05/09/2015    SURGICAL HISTORY: Past Surgical History:  Procedure Laterality Date  . THYROIDECTOMY, PARTIAL      SOCIAL HISTORY: Social History   Social History   . Marital status: Married    Spouse name: N/A  . Number of children: N/A  . Years of education: N/A   Occupational History  . Not on file.   Social History Main Topics  . Smoking status: Never Smoker  . Smokeless tobacco: Never Used     Comment: Pt denies cigarettes  . Alcohol use No  . Drug use: No  . Sexual activity: Not on file   Other Topics Concern  . Not on file   Social History Narrative  . No narrative on file  Married 62 years 2 children 2 grandchildren Born in [place] Never smoker Worked in an office Hobbies: yard work and walking  FAMILY HISTORY: Family History  Problem Relation Age of Onset  . Thyroid disease Mother   . Stroke Father   . Hypertension Brother   Mother passed at 12  Father passed at 23 1 brother - healthy  ALLERGIES:  is allergic to amlodipine; isosorbide nitrate; sulfonamide derivatives; and terazosin.  MEDICATIONS:  Current Outpatient Prescriptions  Medication Sig Dispense Refill  . aspirin EC 81 MG tablet Take 81 mg by mouth every other day.     Marland Kitchen atenolol-chlorthalidone (TENORETIC) 50-25 MG tablet TAKE 1 TABLET BY MOUTH DAILY. 90 tablet 1  . atorvastatin (LIPITOR) 20 MG tablet Take 10 mg by mouth at bedtime. Take 1/2 tablet by mouth daily     .  calcium-vitamin D (OSCAL WITH D) 500-200 MG-UNIT per tablet Take 1 tablet by mouth daily.      . cloNIDine (CATAPRES) 0.1 MG tablet Take 0.1 mg by mouth daily. May take one additional tablet daily as needed.  6  . ferrous sulfate ([employer] IRON) 325 (65 FE) MG tablet Take 325 mg by mouth 2 (two) times daily with a meal.    . flecainide (TAMBOCOR) 50 MG tablet Take 1 tablet (50 mg total) by mouth 2 (two) times daily. 180 tablet 3  . hydrALAZINE (APRESOLINE) 50 MG tablet Take 50 mg by mouth 2 (two) times daily.     . hydroxypropyl methylcellulose / hypromellose (ISOPTO TEARS / GONIOVISC) 2.5 % ophthalmic solution Place 1 drop into both eyes as needed for dry eyes.    Marland Kitchen levothyroxine (SYNTHROID,  LEVOTHROID) 75 MCG tablet TAKE 1 TABLET (75 MCG TOTAL) BY MOUTH DAILY. 90 tablet 3  . lisinopril (PRINIVIL,ZESTRIL) 40 MG tablet Take 40 mg by mouth daily.    . Omega-3 Fatty Acids (FISH OIL) 1200 MG CAPS Take 1,200 mg by mouth daily.    . polyethylene glycol powder (GLYCOLAX/MIRALAX) powder Take 17 g by mouth at bedtime.     No current facility-administered medications for this visit.     Review of Systems  Constitutional: Negative.  Negative for weight loss.  HENT: Positive for nosebleeds (from scabs).   Eyes: Negative.   Respiratory: Negative.   Cardiovascular: Negative.   Gastrointestinal: Negative.  Negative for blood in stool.  Genitourinary: Negative.  Negative for hematuria.  Musculoskeletal: Negative.   Skin: Negative.   Neurological: Negative.   Endo/Heme/Allergies: Bruises/bleeds easily.  Psychiatric/Behavioral: Negative.   All other systems reviewed and are negative.  14 point ROS was done and is otherwise as detailed above or in HPI   PHYSICAL EXAMINATION: ECOG PERFORMANCE STATUS: 0 - Asymptomatic  Vitals:   08/22/16 1529  BP: (!) 196/54  Pulse: (!) 57  Resp: 18  Temp: 97.9 F (36.6 C)   Filed Weights   08/22/16 1529  Weight: 128 lb 12.8 oz (58.4 kg)    Physical Exam  Constitutional: She is oriented to person, place, and time and well-developed, well-nourished, and in no distress.  Pt was able to get on exam table without assistance.   HENT:  Head: Normocephalic and atraumatic.  Mouth/Throat: No oropharyngeal exudate.  Eyes: EOM are normal. Pupils are equal, round, and reactive to light. No scleral icterus.  Neck: Normal range of motion. Neck supple.  Cardiovascular: Normal rate, regular rhythm and normal heart sounds.   Pulmonary/Chest: Effort normal and breath sounds normal.  Abdominal: Soft. Bowel sounds are normal. She exhibits no distension and no mass. There is no tenderness. There is no rebound and no guarding.  Musculoskeletal: Normal range of  motion.  Lymphadenopathy:    She has no cervical adenopathy.  Neurological: She is alert and oriented to person, place, and time. Gait normal.  Skin: Skin is warm and dry.  Psychiatric: Mood, memory, affect and judgment normal.  Nursing note and vitals reviewed.   LABORATORY DATA:  I have reviewed the data as listed Lab Results  Component Value Date   WBC 3.7 (L) 08/15/2016   HGB 11.6 (L) 08/15/2016   HCT 33.6 (L) 08/15/2016   MCV 88.2 08/15/2016   PLT 94 (L) 08/15/2016   CMP     Component Value Date/Time   NA 131 (L) 08/15/2016 2155   NA 138 07/23/2016 0850   K 3.3 (L) 08/15/2016 2155  CL 91 (L) 08/15/2016 2155   CO2 31 08/15/2016 2155   GLUCOSE 138 (H) 08/15/2016 2155   BUN 20 08/15/2016 2155   BUN 16 07/23/2016 0850   CREATININE 1.30 (H) 08/15/2016 2155   CREATININE 1.37 (H) 02/22/2013 1133   CALCIUM 9.2 08/15/2016 2155   PROT 6.7 08/15/2016 2155   PROT 6.7 07/23/2016 0850   ALBUMIN 4.2 08/15/2016 2155   ALBUMIN 4.2 07/23/2016 0850   AST 29 08/15/2016 2155   ALT 22 08/15/2016 2155   ALKPHOS 53 08/15/2016 2155   BILITOT 0.8 08/15/2016 2155   BILITOT 0.5 07/23/2016 0850   GFRNONAA 38 (L) 08/15/2016 2155   GFRNONAA 37 (L) 02/22/2013 1133   GFRAA 44 (L) 08/15/2016 2155   GFRAA 43 (L) 02/22/2013 1133   Results for ODEAL, WELDEN (MRN [id number])   Ref. Range 04/19/2013 10:55 10/25/2014 09:38 11/15/2014 09:47 12/12/2014 10:04 02/22/2015 10:27  Platelet Count, POC Latest Ref Range: 142 - 424 K/uL 139.0 (A) 185 198 165 149   Results for TIMBER, MARSHMAN (MRN [id number])   Ref. Range 05/13/2016 16:45 07/23/2016 08:50 08/15/2016 21:55  Platelets Latest Ref Range: 150 - 400 K/uL 186 103 (L) 94 (L)   Results for AZUL, COFFIE (MRN [id number])   Ref. Range 11/15/2015 09:57 11/27/2015 12:12 03/10/2016 09:28 05/13/2016 16:45 07/23/2016 08:50 08/15/2016 21:55 08/22/2016 16:24  WBC Latest Ref Range: 4.0 - 10.5 K/uL 2.6 (L) 2.6 (L) 2.5 (LL) 5.9 2.2 (LL) 3.7 (L) 3.0 (L)     ASSESSMENT &  PLAN:  Leukopenia and thrombocytopenia  CKD, Stage 3 Chronic diastolic CHF/arrythmias  Leukopenia, intermittent with no specific suppression of one wbc line. She does not have recurrent infections. Thrombocytopenia is also intermittent and longstanding with platelet count of 113K documented back in 2015. She has no significant bleeding. She is chronically on Aspirin.  Tambocor can cause leukopenia/thrombocytopenia although rarely.  Lisinopril is likewise implicated. Most of her antihypertensives can have rare hematologic side effects. Her counts may also be the results of autoimmune disease although I think this is less likely. She has no known liver disease.  I discussed with the patient that thrombocytopenia can be associated with a variety of conditions. Given that her this was an incidental finding it is most likely not "life-threatening" and secondary to immune mediated causes or potential medication exposure.  Does not have a known history of occult liver disease. Her other blood counts do not lead [place] to suspect an MDS. New onset thrombocytopenia rules out the likelihood of congenital thrombocytopenia. She does not give a history that would suggest HIV or HCV infection although it is felt that patients should be tested with a new onset thrombocytopenia.  We will confirm thrombocytopenia by repeating the CBC and reviewing the peripheral blood smear; obtain prior platelet counts, if available, and assess other hematologic abnormalities.  We discussed what a bone marrow biopsy is and how it may benefit her. We will determine if she needs one at her next visit.   Blood work today.   She will return for a follow up in 2 weeks to go over blood work and for further recommendations.  ORDERS PLACED FOR THIS ENCOUNTER: No orders of the defined types were placed in this encounter.  Orders Placed This Encounter  Procedures  . US Abdomen Complete    Standing Status:   Future    Number of  Occurrences:   1    Standing Expiration Date:   08/22/2017    Order Specific Question:  Reason for Exam (SYMPTOM  OR DIAGNOSIS REQUIRED)    Answer:   leukopenia/thrombocytopenia, evaluate for hepatomegaly and splenomegaly    Order Specific Question:   Preferred imaging location?    Answer:   [facility]  . CBC with Differential  . Pathologist smear review  . Sedimentation rate  . Systemic Lupus Profile A  . Vitamin B12  . Folate  . Iron and TIBC  . Ferritin  . Copper, serum  . Miscellaneous [facility] test (send-out)    All questions were answered. The patient knows to call the clinic with any problems, questions or concerns.  This document serves as a record of services personally performed by Ancil Linsey, MD. It was created on her behalf by Martinique Casey, a trained medical scribe. The creation of this record is based on the scribe's personal observations and the provider's statements to them. This document has been checked and approved by the attending provider.  I have reviewed the above documentation for accuracy and completeness and I agree with the above.  This note was electronically signed.   Molli Hazard, MD  08/22/2016 3:45 PM

## 2016-08-22 NOTE — Patient Instructions (Signed)
[place] at [facility] Discharge Instructions  RECOMMENDATIONS MADE BY THE CONSULTANT AND ANY TEST RESULTS WILL BE SENT TO YOUR REFERRING PHYSICIAN.  Return to clinic in 2 weeks  Labs today  Ultrasound of abdomen  Thank you for choosing [place] at [facility] to provide your oncology and hematology care.  To afford each patient quality time with our provider, please arrive at least 15 minutes before your scheduled appointment time.    If you have a lab appointment with the [facility] please come in thru the  Main Entrance and check in at the main information desk  You need to re-schedule your appointment should you arrive 10 or more minutes late.  We strive to give you quality time with our providers, and arriving late affects you and other patients whose appointments are after yours.  Also, if you no show three or more times for appointments you may be dismissed from the clinic at the providers discretion.     Again, thank you for choosing [facility].  Our hope is that these requests will decrease the amount of time that you wait before being seen by our physicians.       _____________________________________________________________  Should you have questions after your visit to [facility], please contact our office at [phone] between the hours of 8:30 a.m. and 4:30 p.m.  Voicemails left after 4:30 p.m. will not be returned until the following business day.  For prescription refill requests, have your pharmacy contact our office.       Resources For Cancer Patients and their Caregivers ? American Cancer Society: Can assist with transportation, wigs, general needs, runs Look Good Feel Better.        7545365659 ? Cancer Care: Provides financial assistance, online support groups, medication/co-pay assistance.  1-800-813-HOPE 859-548-6812) ? [place] Assists [place]  cancer patients and their families through emotional , educational and financial support.  (609) 532-1724 ? [place] DSS Where to apply for food stamps, Medicaid and utility assistance. 408-695-7229 ? [facility]: Transportation to medical appointments. 415-082-2860 ? Social Security Administration: May apply for disability if have a Stage IV cancer. 218 139 0314 551-722-5003 ? [employer], Disability and Transit Services: Assists with nutrition, care and transit needs. [place] Support Programs: @10RELATIVEDAYS @ > Cancer Support Group  2nd Tuesday of the month 1pm-2pm, Journey Room  > Creative Journey  3rd Tuesday of the month 1130am-1pm, Journey Room  > Look Good Feel Better  1st Wednesday of the month 10am-12 noon, Journey Room (Call [place] to register [phone])

## 2016-08-23 LAB — MISC LABCORP TEST (SEND OUT): LABCORP TEST CODE: 56499

## 2016-08-24 LAB — COPPER, SERUM: Copper: 93 ug/dL (ref 72–166)

## 2016-08-25 DIAGNOSIS — R040 Epistaxis: Secondary | ICD-10-CM | POA: Diagnosis not present

## 2016-08-25 DIAGNOSIS — R42 Dizziness and giddiness: Secondary | ICD-10-CM | POA: Diagnosis not present

## 2016-08-25 DIAGNOSIS — H903 Sensorineural hearing loss, bilateral: Secondary | ICD-10-CM | POA: Diagnosis not present

## 2016-08-25 LAB — PATHOLOGIST SMEAR REVIEW

## 2016-08-26 ENCOUNTER — Telehealth: Payer: Self-pay | Admitting: Family Medicine

## 2016-08-26 ENCOUNTER — Other Ambulatory Visit: Payer: Self-pay | Admitting: Radiology

## 2016-08-26 NOTE — Telephone Encounter (Signed)
appt scheduled Pt notified 

## 2016-08-28 DIAGNOSIS — D696 Thrombocytopenia, unspecified: Secondary | ICD-10-CM | POA: Insufficient documentation

## 2016-08-28 DIAGNOSIS — N183 Chronic kidney disease, stage 3 (moderate): Secondary | ICD-10-CM | POA: Insufficient documentation

## 2016-08-28 DIAGNOSIS — E785 Hyperlipidemia, unspecified: Secondary | ICD-10-CM | POA: Insufficient documentation

## 2016-08-28 DIAGNOSIS — Z79899 Other long term (current) drug therapy: Secondary | ICD-10-CM | POA: Insufficient documentation

## 2016-08-28 DIAGNOSIS — D72810 Lymphocytopenia: Secondary | ICD-10-CM | POA: Diagnosis not present

## 2016-08-28 DIAGNOSIS — E039 Hypothyroidism, unspecified: Secondary | ICD-10-CM | POA: Diagnosis not present

## 2016-08-28 DIAGNOSIS — N189 Chronic kidney disease, unspecified: Secondary | ICD-10-CM | POA: Diagnosis not present

## 2016-08-28 DIAGNOSIS — Z7982 Long term (current) use of aspirin: Secondary | ICD-10-CM | POA: Diagnosis not present

## 2016-08-28 DIAGNOSIS — N281 Cyst of kidney, acquired: Secondary | ICD-10-CM | POA: Insufficient documentation

## 2016-08-28 DIAGNOSIS — I129 Hypertensive chronic kidney disease with stage 1 through stage 4 chronic kidney disease, or unspecified chronic kidney disease: Secondary | ICD-10-CM | POA: Insufficient documentation

## 2016-08-28 DIAGNOSIS — I10 Essential (primary) hypertension: Secondary | ICD-10-CM | POA: Diagnosis not present

## 2016-08-29 ENCOUNTER — Other Ambulatory Visit (HOSPITAL_COMMUNITY): Payer: Self-pay | Admitting: Nephrology

## 2016-08-29 ENCOUNTER — Ambulatory Visit (HOSPITAL_COMMUNITY)
Admission: RE | Admit: 2016-08-29 | Discharge: 2016-08-29 | Disposition: A | Discharge status: Home / Self Care | Payer: PPO | Source: Ambulatory Visit | Attending: Hematology & Oncology | Admitting: Hematology & Oncology

## 2016-08-29 ENCOUNTER — Ambulatory Visit (HOSPITAL_COMMUNITY): Payer: PPO

## 2016-08-29 ENCOUNTER — Ambulatory Visit (HOSPITAL_COMMUNITY)
Admission: RE | Admit: 2016-08-29 | Discharge: 2016-08-29 | Disposition: A | Discharge status: Home / Self Care | Payer: PPO | Source: Ambulatory Visit | Attending: Nephrology | Admitting: Nephrology

## 2016-08-29 ENCOUNTER — Encounter (HOSPITAL_COMMUNITY): Payer: Self-pay

## 2016-08-29 DIAGNOSIS — D696 Thrombocytopenia, unspecified: Secondary | ICD-10-CM

## 2016-08-29 DIAGNOSIS — N2889 Other specified disorders of kidney and ureter: Secondary | ICD-10-CM

## 2016-08-29 DIAGNOSIS — D72810 Lymphocytopenia: Secondary | ICD-10-CM

## 2016-08-29 DIAGNOSIS — I129 Hypertensive chronic kidney disease with stage 1 through stage 4 chronic kidney disease, or unspecified chronic kidney disease: Secondary | ICD-10-CM

## 2016-08-29 HISTORY — PX: IR GENERIC HISTORICAL: IMG1180011

## 2016-08-29 LAB — PROTIME-INR
INR: 1.05
PROTHROMBIN TIME: 13.8 s (ref 11.4–15.2)

## 2016-08-29 LAB — CBC
HCT: 34.5 % — ABNORMAL LOW (ref 36.0–46.0)
Hemoglobin: 11.8 g/dL — ABNORMAL LOW (ref 12.0–15.0)
MCH: 29.7 pg (ref 26.0–34.0)
MCHC: 34.2 g/dL (ref 30.0–36.0)
MCV: 86.9 fL (ref 78.0–100.0)
PLATELETS: 116 10*3/uL — AB (ref 150–400)
RBC: 3.97 MIL/uL (ref 3.87–5.11)
RDW: 13.4 % (ref 11.5–15.5)
WBC: 2.8 10*3/uL — ABNORMAL LOW (ref 4.0–10.5)

## 2016-08-29 LAB — BASIC METABOLIC PANEL
Anion gap: 9 (ref 5–15)
BUN: 16 mg/dL (ref 6–20)
CALCIUM: 9.1 mg/dL (ref 8.9–10.3)
CO2: 29 mmol/L (ref 22–32)
CREATININE: 1.37 mg/dL — AB (ref 0.44–1.00)
Chloride: 98 mmol/L — ABNORMAL LOW (ref 101–111)
GFR calc Af Amer: 41 mL/min — ABNORMAL LOW (ref >60)
GFR calc non Af Amer: 35 mL/min — ABNORMAL LOW (ref >60)
GLUCOSE: 110 mg/dL — AB (ref 65–99)
Potassium: 3.6 mmol/L (ref 3.5–5.1)
Sodium: 136 mmol/L (ref 135–145)

## 2016-08-29 LAB — APTT: aPTT: 41 seconds — ABNORMAL HIGH (ref 24–36)

## 2016-08-29 IMAGING — CR DG CHEST 2V
2 series · 2 of 2 positions shown · non-contrast
Comparison: 10/14/2015

CLINICAL DATA: Cough and congestion

EXAM:
CHEST  2 VIEW

[view not recorded (1 of 2)]
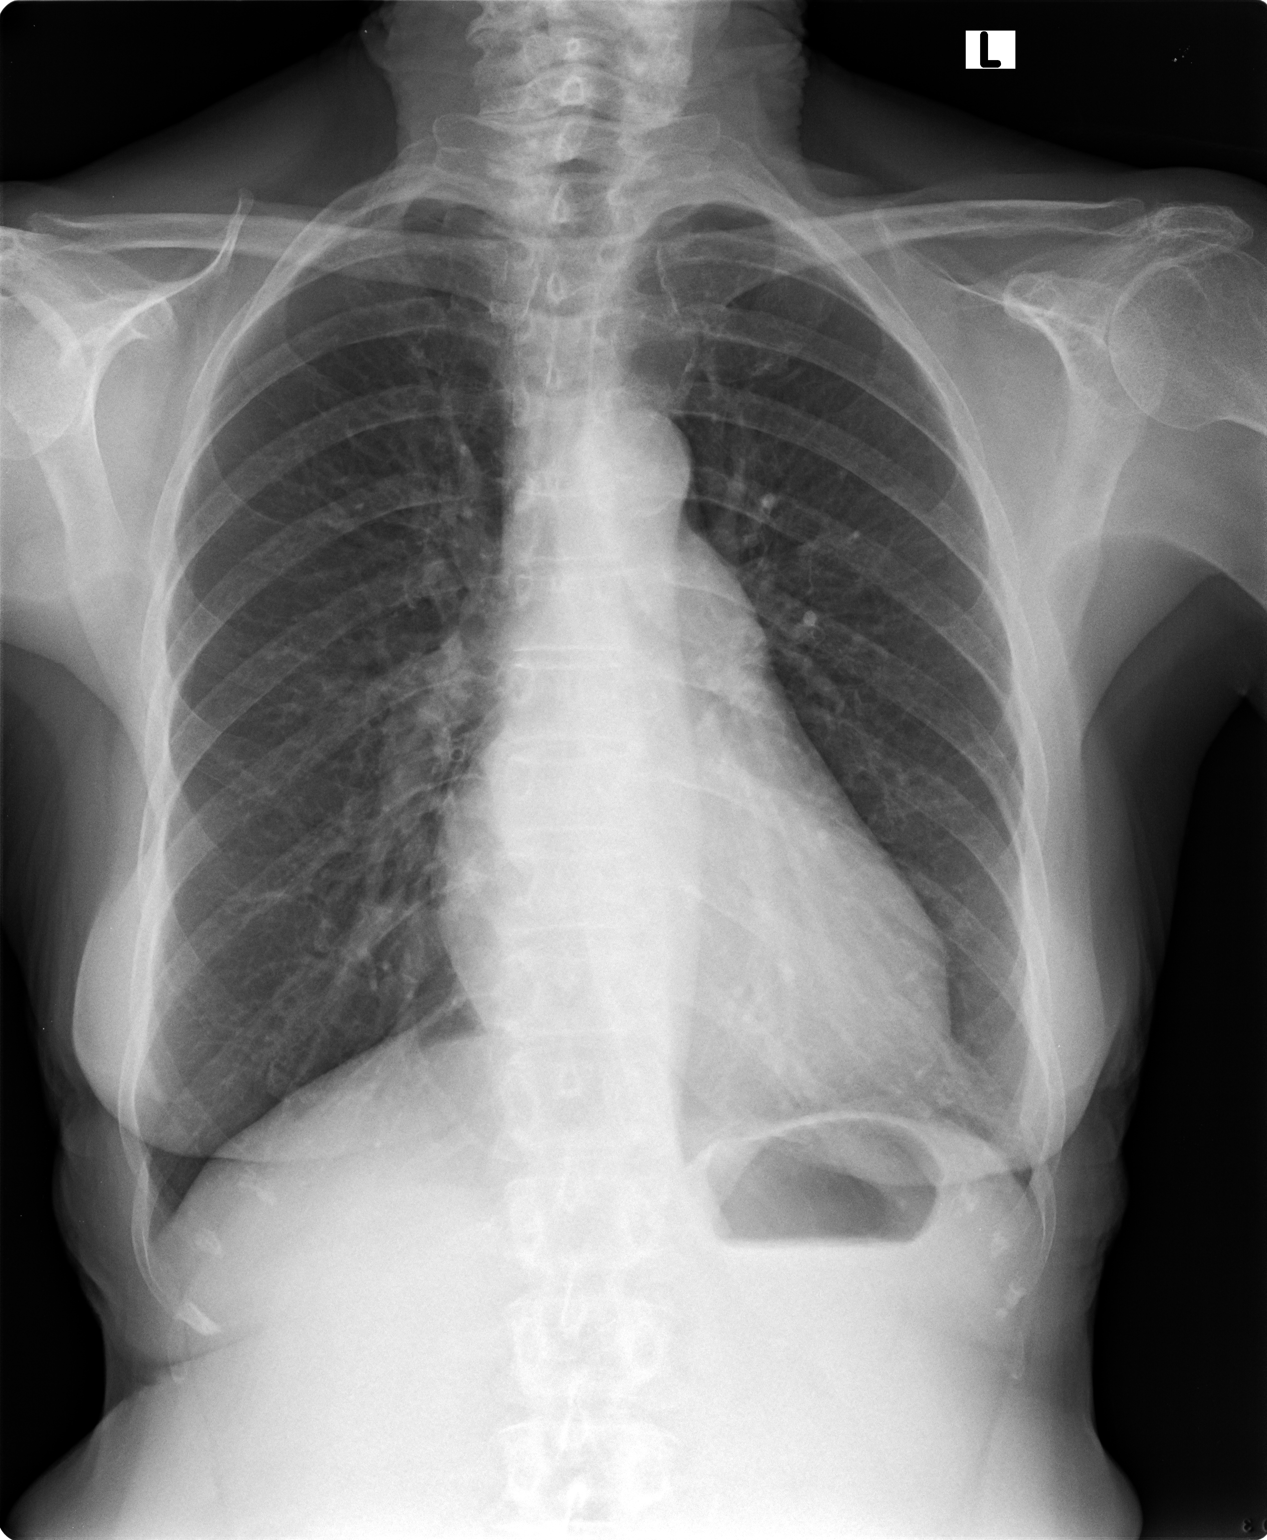

[view not recorded (2 of 2)]
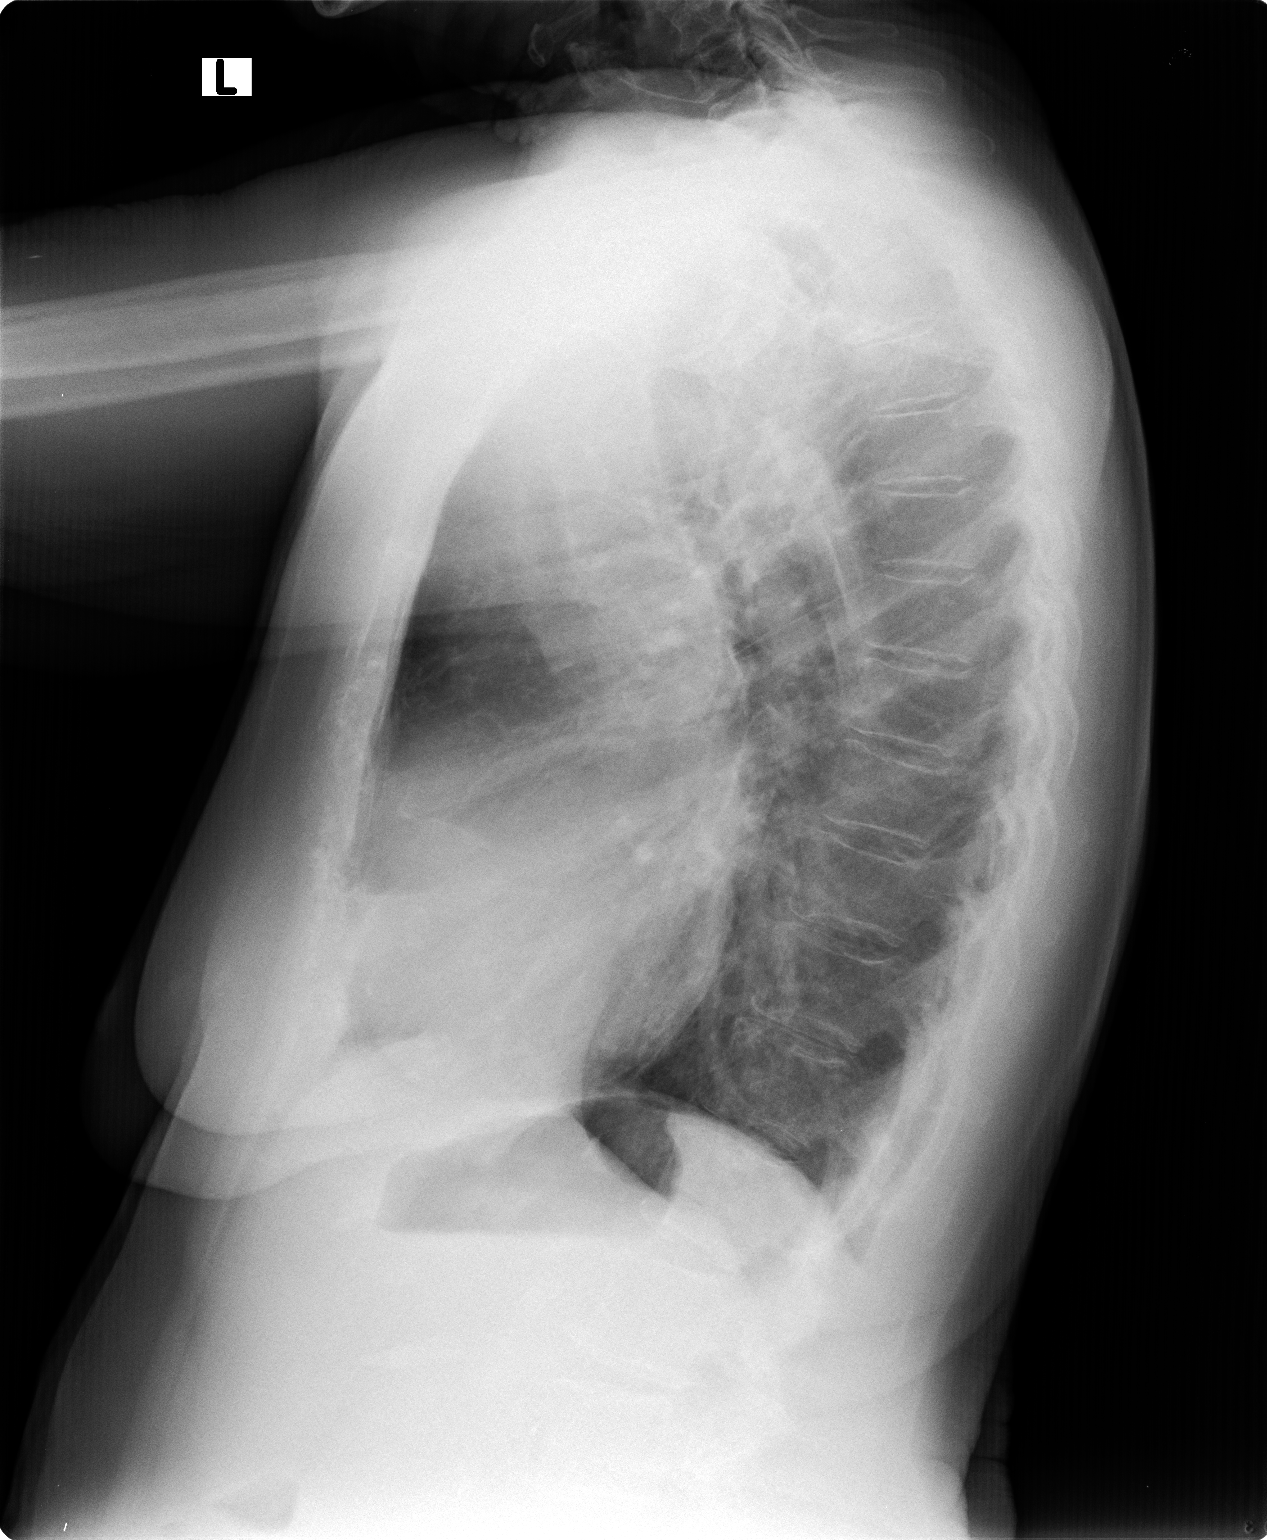

[2 of 2 positions shown; findings below may reference images not displayed]

FINDINGS: Stable mild cardiomegaly. Stable aortic and hilar contours. No acute
infiltrate or edema. No effusion or pneumothorax. No acute osseous
findings.
IMPRESSION: Stable.  No evidence of active disease.

## 2016-08-29 MED ORDER — MIDAZOLAM HCL 2 MG/2ML IJ SOLN
INTRAMUSCULAR | Status: AC
  Filled 2016-08-29: qty 4

## 2016-08-29 MED ORDER — NALOXONE HCL 0.4 MG/ML IJ SOLN
INTRAMUSCULAR | Status: AC
  Filled 2016-08-29: qty 1

## 2016-08-29 MED ORDER — FENTANYL CITRATE (PF) 100 MCG/2ML IJ SOLN
INTRAMUSCULAR | Status: AC | PRN
  Administered 2016-08-29: 25 ug via INTRAVENOUS
  Administered 2016-08-29: 50 ug via INTRAVENOUS
  Administered 2016-08-29: 25 ug via INTRAVENOUS

## 2016-08-29 MED ORDER — FENTANYL CITRATE (PF) 100 MCG/2ML IJ SOLN
INTRAMUSCULAR | Status: AC
  Filled 2016-08-29: qty 4

## 2016-08-29 MED ORDER — LIDOCAINE HCL 1 % IJ SOLN
INTRAMUSCULAR | Status: AC | PRN
  Administered 2016-08-29: 10 mL

## 2016-08-29 MED ORDER — MIDAZOLAM HCL 2 MG/2ML IJ SOLN
INTRAMUSCULAR | Status: AC | PRN
  Administered 2016-08-29: 1 mg via INTRAVENOUS
  Administered 2016-08-29 (×2): 0.5 mg via INTRAVENOUS

## 2016-08-29 MED ORDER — LIDOCAINE HCL (PF) 1 % IJ SOLN
INTRAMUSCULAR | Status: AC
  Filled 2016-08-29: qty 30

## 2016-08-29 MED ORDER — FLUMAZENIL 0.5 MG/5ML IV SOLN
INTRAVENOUS | Status: AC
  Filled 2016-08-29: qty 5

## 2016-08-29 MED ORDER — SODIUM CHLORIDE 0.9 % IV SOLN: INTRAVENOUS | Status: DC

## 2016-08-29 NOTE — Procedures (Signed)
Post renal arteriogram.   No immediate post procedural complications.   EBL: None Keep right leg straight for 4 hrs.    SignedSandi Mariscal Pager: [phone] 08/29/2016, 10:53 AM

## 2016-08-29 NOTE — Sedation Documentation (Signed)
Right Renal Artery pressures: 111/43 (65)  R renal origin: 118/43 (68) Right Aorta: 116/42 (67)

## 2016-08-29 NOTE — Sedation Documentation (Signed)
Left renal artery pressures 132/47 (77) 127/45 (73)  Aorta pressure: 124/44 (71)

## 2016-08-29 NOTE — H&P (Signed)
Chief Complaint: Patient was seen in consultation today for renal arteriogram with possible angioplasty/stent placement at the request of Mattingly,Michael  Referring Physician(s): Mattingly,Michael  Supervising Physician: Sandi Mariscal  Patient Status: [facility] - Out-pt  History of Present Illness: [name] is a 83 y.o. female   Pt has hx Rt renal artery stenosis/stent placement for refractory hypertension 10/2015 in IR with Dr Kathlene Cote She has noticed climbing BP over last few months Even experiencing bouts of nausea and vomiting and light headedness over last several weeks Was seen in ED [facility] 12/29 BP recorded as 171/60 and 156/54 BP readings prior to ED visit at home were over 734 systolic per pt She brings in today record of readings from home and most of her readings are over 193 systolic for weeks. She has had to increase Clonidine dose.  Dr Mercy Moore has requested renal arteriogram with angiopasty/stent placement Scheduled for same today.  Creatinine 1.37  Past Medical History:  Diagnosis Date  . Anemia   . Arthritis   . Asthma   . CKD (chronic kidney disease), stage III   . Hyperlipidemia   . Hypertension   . Hypothyroidism   . Nodule of right lung    Right upper lobe  . Renal insufficiency    Chronic  . Renal vascular disease   . Right renal artery stenosis ([place]) 05/09/2015    Past Surgical History:  Procedure Laterality Date  . THYROIDECTOMY, PARTIAL Right 1981   middle lobe removed 1st, then right lobe removed 7-8 years later (approx. 1988)    Allergies: Amlodipine; Isosorbide nitrate; Sulfonamide derivatives; and Terazosin  Medications: Prior to Admission medications   Medication Sig Start Date End Date Taking? Authorizing Provider  aspirin EC 81 MG tablet Take 81 mg by mouth daily.    Yes Historical Provider, MD  atenolol-chlorthalidone (TENORETIC) 50-25 MG tablet TAKE 1 TABLET BY MOUTH DAILY. 07/03/16  Yes Chipper Herb, MD  atorvastatin  (LIPITOR) 20 MG tablet Take 10 mg by mouth at bedtime.    Yes Historical Provider, MD  calcium-vitamin D (OSCAL WITH D) 500-200 MG-UNIT per tablet Take 1 tablet by mouth daily.     Yes Historical Provider, MD  cloNIDine (CATAPRES) 0.1 MG tablet Take 0.1 mg by mouth See admin instructions. Take 0.1 mg by mouth twice daily. May take 0.1 mg additional daily as needed. 09/13/14  Yes Historical Provider, MD  ferrous sulfate (CVS IRON) 325 (65 FE) MG tablet Take 325 mg by mouth 2 (two) times daily with a meal.   Yes Historical Provider, MD  flecainide (TAMBOCOR) 50 MG tablet Take 1 tablet (50 mg total) by mouth 2 (two) times daily. 06/11/16  Yes Minus Breeding, MD  fluticasone (FLONASE) 50 MCG/ACT nasal spray Place 1 spray into both nostrils 2 (two) times daily as needed for allergies or rhinitis.  06/25/16  Yes Historical Provider, MD  hydrALAZINE (APRESOLINE) 50 MG tablet Take 50 mg by mouth 2 (two) times daily.    Yes Historical Provider, MD  hydroxypropyl methylcellulose / hypromellose (ISOPTO TEARS / GONIOVISC) 2.5 % ophthalmic solution Place 1 drop into both eyes as needed for dry eyes.   Yes Historical Provider, MD  levothyroxine (SYNTHROID, LEVOTHROID) 75 MCG tablet TAKE 1 TABLET (75 MCG TOTAL) BY MOUTH DAILY. 11/26/15  Yes Chipper Herb, MD  lisinopril (PRINIVIL,ZESTRIL) 40 MG tablet Take 40 mg by mouth daily.   Yes Historical Provider, MD  Omega-3 Fatty Acids (FISH OIL) 1200 MG CAPS Take 1,200 mg  by mouth daily.   Yes Historical Provider, MD  polyethylene glycol powder (GLYCOLAX/MIRALAX) powder Take 17 g by mouth at bedtime.   Yes Historical Provider, MD     Family History  Problem Relation Age of Onset  . Thyroid disease Mother   . Stroke Father   . Hypertension Brother   . Lung cancer Maternal Uncle   . Cancer Maternal Grandmother   . Lung cancer Maternal Uncle   . Hypertension Daughter   . Hypercholesterolemia Daughter   . Hypercholesterolemia Daughter     Social History   Social  History  . Marital status: Married    Spouse name: N/A  . Number of children: N/A  . Years of education: N/A   Social History Main Topics  . Smoking status: Never Smoker  . Smokeless tobacco: Never Used     Comment: Pt denies cigarettes  . Alcohol use No  . Drug use: No  . Sexual activity: No   Other Topics Concern  . None   Social History Narrative  . None    Review of Systems: A 12 point ROS discussed and pertinent positives are indicated in the HPI above.  All other systems are negative.  Review of Systems  Constitutional: Positive for activity change. Negative for appetite change, diaphoresis, fatigue and fever.  Respiratory: Negative for cough and shortness of breath.   Cardiovascular: Negative for chest pain.  Gastrointestinal: Negative for abdominal pain.  Neurological: Positive for dizziness, weakness and light-headedness. Negative for tremors, seizures, syncope, facial asymmetry, speech difficulty, numbness and headaches.  Hematological: Bruises/bleeds easily.  Psychiatric/Behavioral: Negative for behavioral problems and confusion.    Vital Signs: BP (!) 167/63   Pulse (!) 48   Temp 97.9 F (36.6 C) (Oral)   Resp 16   Ht 5\' 6"  (1.676 m)   Wt 128 lb (58.1 kg)   SpO2 94%   BMI 20.66 kg/m   Physical Exam  Constitutional: She is oriented to person, place, and time.  Cardiovascular: Normal rate, regular rhythm and normal heart sounds.   Pulmonary/Chest: Effort normal and breath sounds normal.  Abdominal: Soft. Bowel sounds are normal.  Musculoskeletal: Normal range of motion.  Neurological: She is alert and oriented to person, place, and time.  Skin: Skin is warm and dry.  Psychiatric: She has a normal mood and affect. Her behavior is normal. Judgment and thought content normal.  Nursing note and vitals reviewed.   Mallampati Score:  MD Evaluation Airway: WNL Heart: WNL Abdomen: WNL Chest/ Lungs: WNL ASA  Classification: 3 Mallampati/Airway Score:  One  Imaging: Ct Head Wo Contrast  Result Date: 08/15/2016 CLINICAL DATA:  Elevated blood pressure.  Lightheadedness. EXAM: CT HEAD WITHOUT CONTRAST TECHNIQUE: Contiguous axial images were obtained from the base of the skull through the vertex without intravenous contrast. COMPARISON:  Head CT 10/23/2015 FINDINGS: Brain: No evidence of acute infarction, hemorrhage, hydrocephalus, extra-axial collection or mass lesion/mass effect. Age related atrophy and chronic small vessel ischemia. Vascular: Atherosclerosis of skullbase vasculature without hyperdense vessel or abnormal calcification. Skull: Normal. Negative for fracture or focal lesion. Sinuses/Orbits: Paranasal sinuses and mastoid air cells are clear. The visualized orbits are unremarkable. Other: None. IMPRESSION: No acute intracranial abnormality.  No intracranial bleed. Electronically Signed   By: Jeb Levering M.D.   On: 08/15/2016 22:32   US Abdomen Complete  Result Date: 08/28/2016 CLINICAL DATA:  Thrombus cytopenia. History of chronic renal insufficiency, hyperlipidemia. EXAM: ABDOMEN ULTRASOUND COMPLETE COMPARISON:  Abdominal and pelvic CT scan of November 09, 2014 FINDINGS: Gallbladder: The gallbladder is adequately distended. There are multiple echogenic mobile shadowing stones measuring up to 7 mm in diameter. There is no gallbladder wall thickening, pericholecystic fluid, or positive sonographic Murphy's sign. Common bile duct: Diameter: 2.3 mm Liver: No focal lesion identified. Within normal limits in parenchymal echogenicity. IVC: No abnormality visualized. Pancreas: Visualized portion unremarkable. Spleen: The spleen exhibits normal echotexture and contour. It measures 9 cm in length. Right Kidney: Length: 9.3 cm. Echogenicity within normal limits. There is an upper pole simple appearing cyst measuring 2 x 1.7 x 1.6 cm. This has been previously described but has increased in size since the previous study. Left Kidney: Length: 9.8 cm. The  renal cortical echotexture is normal. There is a lower pole hypoechoic structure most compatible with a cyst measuring 9 mm in diameter. Abdominal aorta: No aneurysm visualized.  Ectatic appearance. Other findings: None. IMPRESSION: No hepatosplenomegaly. Gallstones.  No acute cholecystitis. Bilateral renal cysts. Electronically Signed   By: David  Martinique M.D.   On: 08/28/2016 09:06    Labs:  CBC:  Recent Labs  10/23/15 2319  07/23/16 0850 08/15/16 2155 08/22/16 1624 08/29/16 0734  WBC  --   < > 2.2* 3.7* 3.0* 2.8*  HGB 11.2*  --   --  11.6* 12.8 11.8*  HCT 33.0*  < > 34.2 33.6* 36.7 34.5*  PLT  --   < > 103* 94* 134* PENDING  < > = values in this interval not displayed.  COAGS:  Recent Labs  10/23/15 2254 10/31/15 0813  INR 1.05 1.15  APTT 38* 42*    BMP:  Recent Labs  03/10/16 0928 07/23/16 0850 08/15/16 2155 08/29/16 0734  NA 139 138 131* 136  K 3.6 3.8 3.3* 3.6  CL 97 94* 91* 98*  CO2 26 29 31 29   GLUCOSE 96 99 138* 110*  BUN 25 16 20 16   CALCIUM 8.8 9.3 9.2 9.1  CREATININE 1.43* 1.24* 1.30* 1.37*  GFRNONAA 35* 41* 38* 35*  GFRAA 40* 47* 44* 41*    LIVER FUNCTION TESTS:  Recent Labs  11/07/15 1017 03/10/16 0928 07/23/16 0850 08/15/16 2155  BILITOT 0.7 0.4 0.5 0.8  AST 19 24 23 29   ALT 16 16 18 22   ALKPHOS 68 62 67 53  PROT 6.9 6.5 6.7 6.7  ALBUMIN 4.4 4.0 4.2 4.2    TUMOR MARKERS: No results for input(s): AFPTM, CEA, CA199, CHROMGRNA in the last 8760 hours.  Assessment and Plan:  Known RAS Angioplasty/stent placed 10/31/15 Noting climb in BP again- requiring additional medication Refractory to meds Now scheduled for renal arteriogram with possible angioplasty/stent placement Risks and Benefits discussed with the patient including, but not limited to bleeding, infection, vascular injury or contrast induced renal failure. All of the patient's questions were answered, patient is agreeable to proceed. Consent signed and in chart.  Thank  you for this interesting consult.  I greatly enjoyed meeting ORI KREITER and look forward to participating in their care.  A copy of this report was sent to the requesting provider on this date.  Electronically Signed: Zakyah Yanes A 08/29/2016, 8:42 AM   I spent a total of  30 Minutes   in face to face in clinical consultation, greater than 50% of which was counseling/coordinating care for renal arteriogram with poss pta/stent

## 2016-08-29 NOTE — Discharge Instructions (Signed)
Angiogram, Care After These instructions give you information about caring for yourself after your procedure. Your doctor may also give you more specific instructions. Call your doctor if you have any problems or questions after your procedure. Follow these instructions at home:  Take medicines only as told by your doctor.  Follow your doctor's instructions about:  Care of the area where the tube was inserted.  Bandage (dressing) changes and removal.  You may shower 24-48 hours after the procedure or as told by your doctor.  Do not take baths, swim, or use a hot tub until your doctor approves.  Every day, check the area where the tube was inserted. Watch for:  Redness, swelling, or pain.  Fluid, blood, or pus.  Do not apply powder or lotion to the site.  Do not lift anything that is heavier than 10 lb (4.5 kg) for 5 days or as told by your doctor.  Ask your doctor when you can:  Return to work or school.  Do physical activities or play sports.  Have sex.  Do not drive or operate heavy machinery for 24 hours or as told by your doctor.  Have someone with you for the first 24 hours after the procedure.  Keep all follow-up visits as told by your doctor. This is important. Contact a health care provider if:  You have a fever.  You have chills.  You have more bleeding from the area where the tube was inserted. Hold pressure on the area.  You have redness, swelling, or pain in the area where the tube was inserted.  You have fluid or pus coming from the area. Get help right away if:  You have a lot of pain in the area where the tube was inserted.  The area where the tube was inserted is bleeding, and the bleeding does not stop after 30 minutes of holding steady pressure on the area.  The area near or just beyond the insertion site becomes pale, cool, tingly, or numb. This information is not intended to replace advice given to you by your health care provider. Make  sure you discuss any questions you have with your health care provider. Document Released: 10/31/2008 Document Revised: 01/10/2016 Document Reviewed: 01/05/2013 Elsevier Interactive Patient Education  2017 [employer]. Moderate Conscious Sedation, Adult, Care After These instructions provide you with information about caring for yourself after your procedure. Your health care provider may also give you more specific instructions. Your treatment has been planned according to current medical practices, but problems sometimes occur. Call your health care provider if you have any problems or questions after your procedure. What can I expect after the procedure? After your procedure, it is common:  To feel sleepy for several hours.  To feel clumsy and have poor balance for several hours.  To have poor judgment for several hours.  To vomit if you eat too soon. Follow these instructions at home: For at least 24 hours after the procedure:   Do not:  Participate in activities where you could fall or become injured.  Drive.  Use heavy machinery.  Drink alcohol.  Take sleeping pills or medicines that cause drowsiness.  Make important decisions or sign legal documents.  Take care of children on your own.  Rest. Eating and drinking  Follow the diet recommended by your health care provider.  If you vomit:  Drink water, juice, or soup when you can drink without vomiting.  Make sure you have little or no nausea before  eating solid foods. General instructions  Have a responsible adult stay with you until you are awake and alert.  Take over-the-counter and prescription medicines only as told by your health care provider.  If you smoke, do not smoke without supervision.  Keep all follow-up visits as told by your health care provider. This is important. Contact a health care provider if:  You keep feeling nauseous or you keep vomiting.  You feel light-headed.  You develop a  rash.  You have a fever. Get help right away if:  You have trouble breathing. This information is not intended to replace advice given to you by your health care provider. Make sure you discuss any questions you have with your health care provider. Document Released: 05/25/2013 Document Revised: 01/07/2016 Document Reviewed: 11/24/2015 Elsevier Interactive Patient Education  2017 [employer].

## 2016-09-01 ENCOUNTER — Ambulatory Visit (INDEPENDENT_AMBULATORY_CARE_PROVIDER_SITE_OTHER): Payer: PPO | Admitting: Family Medicine

## 2016-09-01 ENCOUNTER — Telehealth: Payer: Self-pay | Admitting: Neurology

## 2016-09-01 ENCOUNTER — Encounter: Payer: Self-pay | Admitting: Family Medicine

## 2016-09-01 VITALS — BP 207/74 | HR 71 | Temp 97.7°F | Ht 66.0 in | Wt 128.0 lb

## 2016-09-01 DIAGNOSIS — R42 Dizziness and giddiness: Secondary | ICD-10-CM

## 2016-09-01 NOTE — Patient Instructions (Signed)
We spoke with Dr. Floyde Parkins with [facility] neurology and he will call the patient and arrange for further visit and workup for this episodes of dizziness

## 2016-09-01 NOTE — Telephone Encounter (Signed)
Dr. Laurance Flatten called our office regarding this patient she has had episodes of vertigo lasting about an hour off and on since around December 26. The patient is having nausea with the events, she may have a bowel movement after the event is over.  We will have the patient evaluated as a new patient through this office. I have contacted our new patient coordinator.

## 2016-09-01 NOTE — Progress Notes (Signed)
Subjective:    Patient ID: [name], female    DOB: Apr 11, 1934, 83 y.o.   MRN: [id number]  HPI Patient here today for dizziness. She has had 4 "spells" of this. The patient recently had renal arteries studied again and all of the arteries are completely patent including the one that had the stent and it. She also seen the ear nose and throat specialist and he did not have any suggestions regarding her episodes of dizziness. The patient brings in home blood pressure readings from the outside and the majority of these are much better than anything that we get in the office. She is still seeing the nephrologist. She does take extra clonidine for elevated blood pressure. These records will be scanned into the record. We did review again the normal bilateral CO2 renal artery arteriogram with her and gave her a copy of this report. She is seen inand throat specialist and he said that she should come back and see [place] he did not see any problems from his side. She denies any headaches associated with these episodes. She has had 4 distinct episodes and they last about an hour and is always very similar in that 3 of the occasions she has been sitting up and she gets really sick has to throw up and have a bowel movement and then she feels weak after this happens and lays down and rest and everything is better. The episodes always include dizziness with nausea and vomiting. She denies any chest pain or shortness of breath trouble with blood in the stool or black tarry bowel movements. She is passing her water without problems. His usual her blood pressure in the office is elevated but she does bring in outside readings which are good. These 4 distinct episodes occurred on December 23, December 29, January 6, and January 12.    Patient Active Problem List   Diagnosis Date Noted  . Renal vascular disease 10/31/2015  . Nodule of right lung 10/31/2015  . Asthma without status asthmaticus 10/31/2015  . Essential  hypertension, malignant   . Hypothyroidism 10/15/2015  . Pancytopenia ([place]) 10/15/2015  . Hyponatremia 10/14/2015  . Thrombocytopenia ([name]) 06/16/2015  . Right renal artery stenosis ([place]) 05/09/2015  . Chronic diastolic heart failure ([place]) 04/03/2014  . Acute diastolic heart failure ([place]) 04/03/2014  . Hypertensive urgency 04/02/2014  . Headache 04/02/2014  . CKD (chronic kidney disease) stage 3, GFR 30-59 ml/min 04/02/2014  . Paroxysmal atrial fibrillation ([place]) 01/25/2014  . Hypokalemia 07/19/2013  . Anemia 07/19/2013  . Bilateral lower extremity edema 06/30/2013  . Hypertension with fluid overload 06/30/2013  . Need for prophylactic vaccination and inoculation against influenza 05/31/2013  . Pedal edema 05/10/2013  . Varicose veins of lower extremities with other complications [id number]/[id number]  . ABNORMAL STRESS ELECTROCARDIOGRAM 01/23/2010  . Hyperlipidemia 12/18/2009  . Essential hypertension 12/18/2009  . RENAL INSUFFICIENCY, CHRONIC 12/18/2009  . ARTHRITIS 12/18/2009   Outpatient Encounter Prescriptions as of 09/01/2016  Medication Sig  . aspirin EC 81 MG tablet Take 81 mg by mouth daily.   Marland Kitchen atenolol-chlorthalidone (TENORETIC) 50-25 MG tablet TAKE 1 TABLET BY MOUTH DAILY.  Marland Kitchen atorvastatin (LIPITOR) 20 MG tablet Take 10 mg by mouth at bedtime.   . calcium-vitamin D (OSCAL WITH D) 500-200 MG-UNIT per tablet Take 1 tablet by mouth daily.    . cloNIDine (CATAPRES) 0.1 MG tablet Take 0.1 mg by mouth See admin instructions. Take 0.1 mg by mouth twice daily. May take 0.1 mg additional daily  as needed.  . ferrous sulfate (CVS IRON) 325 (65 FE) MG tablet Take 325 mg by mouth 2 (two) times daily with a meal.  . flecainide (TAMBOCOR) 50 MG tablet Take 1 tablet (50 mg total) by mouth 2 (two) times daily.  . fluticasone (FLONASE) 50 MCG/ACT nasal spray Place 1 spray into both nostrils 2 (two) times daily as needed for allergies or rhinitis.   . hydrALAZINE (APRESOLINE) 50 MG tablet Take 50 mg  by mouth 2 (two) times daily.   . hydroxypropyl methylcellulose / hypromellose (ISOPTO TEARS / GONIOVISC) 2.5 % ophthalmic solution Place 1 drop into both eyes as needed for dry eyes.  Marland Kitchen levothyroxine (SYNTHROID, LEVOTHROID) 75 MCG tablet TAKE 1 TABLET (75 MCG TOTAL) BY MOUTH DAILY.  Marland Kitchen lisinopril (PRINIVIL,ZESTRIL) 40 MG tablet Take 40 mg by mouth daily.  . Omega-3 Fatty Acids (FISH OIL) 1200 MG CAPS Take 1,200 mg by mouth daily.  . polyethylene glycol powder (GLYCOLAX/MIRALAX) powder Take 17 g by mouth at bedtime.   No facility-administered encounter medications on file as of 09/01/2016.       Review of Systems  Constitutional: Negative.   HENT: Negative.   Eyes: Negative.   Respiratory: Negative.   Cardiovascular: Negative.   Gastrointestinal: Negative.   Endocrine: Negative.   Genitourinary: Negative.   Musculoskeletal: Negative.   Skin: Negative.   Allergic/Immunologic: Negative.   Neurological: Positive for dizziness (at times).  Hematological: Negative.   Psychiatric/Behavioral: Negative.        Objective:   Physical Exam  Constitutional: She is oriented to person, place, and time. She appears well-developed and well-nourished. No distress.  HENT:  Head: Normocephalic and atraumatic.  Right Ear: External ear normal.  Left Ear: External ear normal.  Nose: Nose normal.  Mouth/Throat: No oropharyngeal exudate.  Eyes: Conjunctivae and EOM are normal. Pupils are equal, round, and reactive to light. Right eye exhibits no discharge. Left eye exhibits no discharge. No scleral icterus.  Neck: Normal range of motion. Neck supple. No thyromegaly present.  Bruits or thyromegaly are not present  Cardiovascular: Normal rate, regular rhythm and normal heart sounds.   No murmur heard. 60/m with a regular rate and rhythm  Pulmonary/Chest: Effort normal and breath sounds normal. No respiratory distress. She has no wheezes. She has no rales.  Clear anteriorly and posteriorly    Abdominal: Soft. Bowel sounds are normal. She exhibits no mass. There is no tenderness. There is no rebound and no guarding.  No abdominal tenderness masses or bruits  Musculoskeletal: Normal range of motion. She exhibits no edema.  Lymphadenopathy:    She has no cervical adenopathy.  Neurological: She is alert and oriented to person, place, and time. She has normal reflexes. No cranial nerve deficit.  Skin: Skin is warm and dry. No rash noted.  Psychiatric: She has a normal mood and affect. Her behavior is normal. Judgment and thought content normal.  Nursing note and vitals reviewed.  BP (!) 207/74   Pulse 71   Temp 97.7 F (36.5 C) (Oral)   Ht 5\' 6"  (1.676 m)   Wt 128 lb (58.1 kg)   BMI 20.66 kg/m         Assessment & Plan:  1. Dizziness and giddiness -The patient has had 4 distinct episodes of this since December 23 of 2017. These episodes last for about an hour and include severe nausea vomiting and dizziness and they are of sudden onset. -The patient did have a CT scan at the emergency room  following one of these episodes on December 29. The CT scan was essentially negative. -While the patient was in the office, I did speak to the neurologist, Dr. Jannifer [place], and he is going to schedule her to come in and have an MRI and do a further evaluation of these episodes of dizziness. 2. Hypertension -The patient continues to be followed by the nephrologist and her most recent renal artery studies show that the arteries are patent and that the stent that was placed is also patent. -In office readings for this patient are always elevated but outside readings are good, most of the time.  Patient Instructions  We spoke with Dr. Floyde Parkins with [facility] neurology and he will call the patient and arrange for further visit and workup for this episodes of dizziness  Arrie Senate MD

## 2016-09-05 ENCOUNTER — Encounter (HOSPITAL_COMMUNITY): Payer: Self-pay | Admitting: Hematology & Oncology

## 2016-09-05 ENCOUNTER — Encounter (HOSPITAL_BASED_OUTPATIENT_CLINIC_OR_DEPARTMENT_OTHER): Payer: PPO | Admitting: Hematology & Oncology

## 2016-09-05 VITALS — BP 208/60 | HR 56 | Temp 98.2°F | Resp 16 | Wt 126.0 lb

## 2016-09-05 DIAGNOSIS — R030 Elevated blood-pressure reading, without diagnosis of hypertension: Secondary | ICD-10-CM

## 2016-09-05 DIAGNOSIS — I10 Essential (primary) hypertension: Secondary | ICD-10-CM

## 2016-09-05 DIAGNOSIS — D72810 Lymphocytopenia: Secondary | ICD-10-CM | POA: Diagnosis not present

## 2016-09-05 DIAGNOSIS — I5032 Chronic diastolic (congestive) heart failure: Secondary | ICD-10-CM

## 2016-09-05 DIAGNOSIS — R42 Dizziness and giddiness: Secondary | ICD-10-CM

## 2016-09-05 DIAGNOSIS — R197 Diarrhea, unspecified: Secondary | ICD-10-CM

## 2016-09-05 DIAGNOSIS — D696 Thrombocytopenia, unspecified: Secondary | ICD-10-CM | POA: Diagnosis not present

## 2016-09-05 DIAGNOSIS — N183 Chronic kidney disease, stage 3 unspecified: Secondary | ICD-10-CM

## 2016-09-05 DIAGNOSIS — R1110 Vomiting, unspecified: Secondary | ICD-10-CM

## 2016-09-05 NOTE — Progress Notes (Signed)
[name]  Progress Note  Patient Care Team: Chipper Herb, MD as PCP - General (Family Medicine)  CHIEF COMPLAINTS/PURPOSE OF CONSULTATION:  Leukopenia and thrombocytopenia   HISTORY OF PRESENTING ILLNESS:  [name] 83 y.o. female is here because of leukopenia and thrombocytopenia. She has CKD and follows with Dr. Mercy Moore. She notes that Dr. Laurance Flatten has referred her today for further evaluation of her blood count abnormalities.  [name] presents to the clinic today unaccompanied. I have reviewed the labs with the patient.   She reports feeling very dizzy today. She denies vomiting or diarrhea today. Patient states today and yesterday have not been as bad.  Her daughter states that since December 23rd the patient has once weekly had episodes of dizziness which progress to vomiting and diarrhea. This then makes her feel very weak. She describes the dizziness as coming on very suddenly and feeling as though she is on a boat rocking from side to side. The vomiting makes her feel better. She denies vertigo. She saw an ENT who recommended she see a neurologist as the symptoms did not seem related to an inner ear problem. She then saw her primary care physician who referred her to neurology on February 2nd with an MRI. She had a CT head on 08/15/2016.  She denies feeling her heart fluttering when she has the spells of dizziness. Patient states she had an angiogram done last Friday.  Blood pressure elevated today. Patient states these BP readings are normal for when she is at a doctor's office. Currently denies any fever or chills. Presents to review studies from her initial visit.   MEDICAL HISTORY:  Past Medical History:  Diagnosis Date  . Anemia   . Arthritis   . Asthma   . CKD (chronic kidney disease), stage III   . Hyperlipidemia   . Hypertension   . Hypothyroidism   . Nodule of right lung    Right upper lobe  . Renal insufficiency    Chronic  .  Renal vascular disease   . Right renal artery stenosis ([place]) 05/09/2015    SURGICAL HISTORY: Past Surgical History:  Procedure Laterality Date  . IR GENERIC HISTORICAL  08/29/2016   IR US GUIDE VASC ACCESS RIGHT 08/29/2016 [facility]  . IR GENERIC HISTORICAL  08/29/2016   IR RENAL BILAT S&I MOD SED 08/29/2016 [facility]  . THYROIDECTOMY, PARTIAL Right 1981   middle lobe removed 1st, then right lobe removed 7-8 years later (approx. 1988)    SOCIAL HISTORY: Social History   Social History  . Marital status: Married    Spouse name: N/A  . Number of children: N/A  . Years of education: N/A   Occupational History  . Not on file.   Social History Main Topics  . Smoking status: Never Smoker  . Smokeless tobacco: Never Used     Comment: Pt denies cigarettes  . Alcohol use No  . Drug use: No  . Sexual activity: No   Other Topics Concern  . Not on file   Social History Narrative  . No narrative on file  Married 59 years 2 children 2 grandchildren Born in [place] Never smoker Worked in an office Hobbies: yard work and walking  FAMILY HISTORY: Family History  Problem Relation Age of Onset  . Thyroid disease Mother   . Stroke Father   . Hypertension Brother   . Lung cancer Maternal Uncle   . Cancer Maternal Grandmother   .  Lung cancer Maternal Uncle   . Hypertension Daughter   . Hypercholesterolemia Daughter   . Hypercholesterolemia Daughter   Mother passed at 86  Father passed at 47 1 brother - healthy  ALLERGIES:  is allergic to amlodipine; isosorbide nitrate; sulfonamide derivatives; and terazosin.  MEDICATIONS:  Current Outpatient Prescriptions  Medication Sig Dispense Refill  . aspirin EC 81 MG tablet Take 81 mg by mouth daily.     Marland Kitchen atenolol-chlorthalidone (TENORETIC) 50-25 MG tablet TAKE 1 TABLET BY MOUTH DAILY. 90 tablet 1  . atorvastatin (LIPITOR) 20 MG tablet Take 10 mg by mouth at bedtime.     . calcium-vitamin D (OSCAL WITH D) 500-200 MG-UNIT per  tablet Take 1 tablet by mouth daily.      . cloNIDine (CATAPRES) 0.1 MG tablet Take 0.1 mg by mouth See admin instructions. Take 0.1 mg by mouth twice daily. May take 0.1 mg additional daily as needed.  6  . ferrous sulfate ([employer] IRON) 325 (65 FE) MG tablet Take 325 mg by mouth 2 (two) times daily with a meal.    . flecainide (TAMBOCOR) 50 MG tablet Take 1 tablet (50 mg total) by mouth 2 (two) times daily. 180 tablet 3  . fluticasone (FLONASE) 50 MCG/ACT nasal spray Place 1 spray into both nostrils 2 (two) times daily as needed for allergies or rhinitis.     . hydrALAZINE (APRESOLINE) 50 MG tablet Take 50 mg by mouth 2 (two) times daily.     . hydroxypropyl methylcellulose / hypromellose (ISOPTO TEARS / GONIOVISC) 2.5 % ophthalmic solution Place 1 drop into both eyes as needed for dry eyes.    Marland Kitchen levothyroxine (SYNTHROID, LEVOTHROID) 75 MCG tablet TAKE 1 TABLET (75 MCG TOTAL) BY MOUTH DAILY. 90 tablet 3  . lisinopril (PRINIVIL,ZESTRIL) 40 MG tablet Take 40 mg by mouth daily.    . Omega-3 Fatty Acids (FISH OIL) 1200 MG CAPS Take 1,200 mg by mouth daily.    . polyethylene glycol powder (GLYCOLAX/MIRALAX) powder Take 17 g by mouth at bedtime.     No current facility-administered medications for this visit.     Review of Systems  Constitutional: Negative.  Negative for weight loss.  Eyes: Negative.   Respiratory: Negative.   Cardiovascular: Negative.  Negative for palpitations.  Gastrointestinal: Positive for diarrhea and vomiting. Negative for blood in stool.       Vomiting and diarrhea associated with dizziness  Genitourinary: Negative.  Negative for hematuria.  Musculoskeletal: Negative.   Skin: Negative.   Neurological: Positive for dizziness.  Endo/Heme/Allergies: Bruises/bleeds easily.  Psychiatric/Behavioral: Negative.   All other systems reviewed and are negative.  14 point ROS was done and is otherwise as detailed above or in HPI   PHYSICAL EXAMINATION: ECOG PERFORMANCE STATUS: 0  - Asymptomatic  Vitals:   09/05/16 1324 09/05/16 1325  BP: (!) 211/74 (!) 208/60  Pulse: (!) 56   Resp: 16   Temp: 98.2 F (36.8 C)    Filed Weights   09/05/16 1324  Weight: 126 lb (57.2 kg)    Physical Exam  Constitutional: She is oriented to person, place, and time and well-developed, well-nourished, and in no distress.  I did not have the patient get on the exam table because of current dizziness.  HENT:  Head: Normocephalic and atraumatic.  Eyes: EOM are normal. Pupils are equal, round, and reactive to light.  Neck: Normal range of motion. Neck supple.  Cardiovascular: Normal rate, regular rhythm and normal heart sounds.   Pulmonary/Chest: Effort normal  and breath sounds normal.  Abdominal: Soft. Bowel sounds are normal. She exhibits no distension. There is no tenderness.  Musculoskeletal: Normal range of motion.  Neurological: She is alert and oriented to person, place, and time. Coordination normal.  Skin: Skin is warm and dry.  Psychiatric: Mood, memory, affect and judgment normal.  Nursing note and vitals reviewed.   LABORATORY DATA:  I have reviewed the data as listed Lab Results  Component Value Date   WBC 2.8 (L) 08/29/2016   HGB 11.8 (L) 08/29/2016   HCT 34.5 (L) 08/29/2016   MCV 86.9 08/29/2016   PLT 116 (L) 08/29/2016   CMP     Component Value Date/Time   NA 136 08/29/2016 0734   NA 138 07/23/2016 0850   K 3.6 08/29/2016 0734   CL 98 (L) 08/29/2016 0734   CO2 29 08/29/2016 0734   GLUCOSE 110 (H) 08/29/2016 0734   BUN 16 08/29/2016 0734   BUN 16 07/23/2016 0850   CREATININE 1.37 (H) 08/29/2016 0734   CREATININE 1.37 (H) 02/22/2013 1133   CALCIUM 9.1 08/29/2016 0734   PROT 6.7 08/15/2016 2155   PROT 6.7 07/23/2016 0850   ALBUMIN 4.2 08/15/2016 2155   ALBUMIN 4.2 07/23/2016 0850   AST 29 08/15/2016 2155   ALT 22 08/15/2016 2155   ALKPHOS 53 08/15/2016 2155   BILITOT 0.8 08/15/2016 2155   BILITOT 0.5 07/23/2016 0850   GFRNONAA 35 (L)  08/29/2016 0734   GFRNONAA 37 (L) 02/22/2013 1133   GFRAA 41 (L) 08/29/2016 0734   GFRAA 43 (L) 02/22/2013 1133   Results for NYELA, CORTINAS (MRN [id number])  Ref. Range 08/22/2016 16:24  Iron Latest Ref Range: 28 - 170 ug/dL 51  UIBC Latest Units: ug/dL 284  TIBC Latest Ref Range: 250 - 450 ug/dL 335  Saturation Ratios Latest Ref Range: 10.4 - 31.8 % 15  Ferritin Latest Ref Range: 11 - 307 ng/mL 126  Folate Latest Ref Range: >5.9 ng/mL 46.5  Copper Latest Ref Range: 72 - 166 ug/dL 93  Vitamin B12 Latest Ref Range: 180 - 914 pg/mL 835   Results for JENEVIEVE, KIRSCHBAUM (MRN [id number])   Ref. Range 08/22/2016 16:24  Sed Rate Latest Ref Range: 0 - 22 mm/hr 30 (H)         ASSESSMENT & PLAN:  Leukopenia and thrombocytopenia  CKD, stage III Chronic diastolic CHF/arrythmias Dizziness/vomiting/diarrhea  Labs obtained at her initial visit were reviewed with the patient and noted above.I advised her that it is still possible that her medications may be contributing to her blood count abnormalities.  We discussed what a bone marrow biopsy is and how it may benefit her. We will determine if she needs one at her next visit.   Blood pressure elevated today: 208/60 and 211/74. Patient states these BP readings are normal for when she is at a doctor's office. I advised her that I do not see BP elevations this high documented elsewhere in EPIC. Currently she denies any symptoms other than mild dizziness. She does not wish to go to the ED.   I will try to get her neurology appointment moved up. She is scheduled for 2/5. The patient had a CT Head on 08/15/2016 which showed no acute intracranial abnormality and no intracranial bleed  She will return for a follow up in 2 weeks to go over blood work and discuss the possibility of ordering the bone marrow biopsy.   All questions were answered. The patient knows to call the  clinic with any problems, questions or concerns.  This document serves as a record  of services personally performed by Ancil Linsey, MD. It was created on her behalf by Arlyce Harman, a trained medical scribe. The creation of this record is based on the scribe's personal observations and the provider's statements to them. This document has been checked and approved by the attending provider.  I have reviewed the above documentation for accuracy and completeness and I agree with the above.  This note was electronically signed.    Molli Hazard, MD  09/05/2016 1:51 PM

## 2016-09-05 NOTE — Patient Instructions (Signed)
[place] at [facility] Discharge Instructions  RECOMMENDATIONS MADE BY THE CONSULTANT AND ANY TEST RESULTS WILL BE SENT TO YOUR REFERRING PHYSICIAN.  You were seen by Dr. Whitney Muse today We will call you early next week with your appointments for MRI and followup  Thank you for choosing [place] at [facility] to provide your oncology and hematology care.  To afford each patient quality time with our provider, please arrive at least 15 minutes before your scheduled appointment time.    If you have a lab appointment with the [facility] please come in thru the  Main Entrance and check in at the main information desk  You need to re-schedule your appointment should you arrive 10 or more minutes late.  We strive to give you quality time with our providers, and arriving late affects you and other patients whose appointments are after yours.  Also, if you no show three or more times for appointments you may be dismissed from the clinic at the providers discretion.     Again, thank you for choosing [facility]  Our hope is that these requests will decrease the amount of time that you wait before being seen by our physicians.       _____________________________________________________________  Should you have questions after your visit to [facility], please contact our office at [phone] between the hours of 8:30 a.m. and 4:30 p.m.  Voicemails left after 4:30 p.m. will not be returned until the following business day.  For prescription refill requests, have your pharmacy contact our office.       Resources For Cancer Patients and their Caregivers ? American Cancer Society: Can assist with transportation, wigs, general needs, runs Look Good Feel Better.        303-347-4368 ? Cancer Care: Provides financial assistance, online support groups, medication/co-pay assistance.  1-800-813-HOPE (938)542-5058) ? [place] Assists [place] cancer patients and their families through emotional , educational and financial support.  470-661-6980 ? [place] DSS Where to apply for food stamps, Medicaid and utility assistance. 4171111423 ? [facility]: Transportation to medical appointments. 380 116 3346 ? Social Security Administration: May apply for disability if have a Stage IV cancer. (669)635-6986 506-845-7359 ? [employer], Disability and Transit Services: Assists with nutrition, care and transit needs. [place] Support Programs: @10RELATIVEDAYS @ > Cancer Support Group  2nd Tuesday of the month 1pm-2pm, Journey Room  > Creative Journey  3rd Tuesday of the month 1130am-1pm, Journey Room  > Look Good Feel Better  1st Wednesday of the month 10am-12 noon, Journey Room (Call [facility] to register [phone])

## 2016-09-08 ENCOUNTER — Ambulatory Visit (HOSPITAL_COMMUNITY): Payer: Self-pay | Admitting: Hematology & Oncology

## 2016-09-08 ENCOUNTER — Telehealth: Payer: Self-pay | Admitting: Neurology

## 2016-09-08 NOTE — Telephone Encounter (Signed)
Called pt and work-in appt scheduled for tomorrow morning.

## 2016-09-08 NOTE — Telephone Encounter (Signed)
Amy/Dr Ancil Linsey [phone] called says the pt is getting worse, Dr Whitney Muse is wanting to know if the pt can be seen sooner than 09/22/16. Please call the pt with an appt time if available. Amy said she would check in EPIC for the appt date so she did not need to be called back unless the pt can not be seen sooner.

## 2016-09-09 ENCOUNTER — Telehealth: Payer: Self-pay | Admitting: Family Medicine

## 2016-09-09 ENCOUNTER — Ambulatory Visit (INDEPENDENT_AMBULATORY_CARE_PROVIDER_SITE_OTHER): Payer: PPO | Admitting: Neurology

## 2016-09-09 ENCOUNTER — Encounter: Payer: Self-pay | Admitting: Neurology

## 2016-09-09 DIAGNOSIS — R42 Dizziness and giddiness: Secondary | ICD-10-CM

## 2016-09-09 MED ORDER — OSELTAMIVIR PHOSPHATE 75 MG PO CAPS: 75.0000 mg | ORAL_CAPSULE | Freq: Two times a day (BID) | ORAL | 0 refills | Status: DC

## 2016-09-09 NOTE — Progress Notes (Signed)
Reason for visit: Vertigo  Referring physician: Dr. Barrett Henle is a 83 y.o. female  History of present illness:  [name] he is an 83 year old right-handed white female with a prior history of a movement disorder with a task specific tremor involving Janet right arm. This has been present for about 20 years or more. [name] has had problems with hypertension, she has had renal artery stenosis requiring a stent, and she just had reevaluation of her renal arteries within Janet last week or 2. [name] has had labile blood pressures, blood pressures are commonly within Janet normal range at home, but they can spike to around 132 systolic. [name] began having episodes of vertigo on 08/09/2016. She has had a total 5 episodes since that time, all but one episode has occurred in Janet evening hours. Janet episode will come on with a rocking sensation, and then onset of nausea and vomiting. [name] will be to have a bowel movement as well. Janet severe episode of vertigo will last about 1 hour, then will improve. [name] has had one day where she had 2 episodes, but usually Janet episodes are separated by about 1 week. Janet events are usually not associated with Janet headache, but one episode was associated with a left frontotemporal headache that she interpreted as a sinus problem. [name] has not had any other associated symptoms of double vision, loss of vision, loss of consciousness, confusion, or numbness or weakness of Janet face, arms, or legs. [name] denies any slurred speech or problems with swallowing. She denies any ear pain or change in hearing or muffled hearing during Janet events. She does have occasional problems with oscillopsia during Janet periods of vertigo. [name] has gait instability with Janet vertigo, she has not had any falls. She does not have a prior history of migraine headache. [name] has undergone a CT scan of Janet brain that was unremarkable. She is  sent to this office for further evaluation. She has been seen through an ENT physician, she was told that Janet audiometric testing that was done was relatively unremarkable. She is sent to this office for an evaluation. There are no activating factors for Janet events, [name] may have onset of vertigo while standing, sitting, or lying down.  Past Medical History:  Diagnosis Date  . Anemia   . Arthritis   . Asthma   . CKD (chronic kidney disease), stage III   . Hyperlipidemia   . Hypertension   . Hypothyroidism   . Nodule of right lung    Right upper lobe  . Renal insufficiency    Chronic  . Renal vascular disease   . Right renal artery stenosis ([place]) 05/09/2015  . Vertigo     Past Surgical History:  Procedure Laterality Date  . IR GENERIC HISTORICAL  08/29/2016   IR US GUIDE VASC ACCESS RIGHT 08/29/2016 [facility]  . IR GENERIC HISTORICAL  08/29/2016   IR RENAL BILAT S&I MOD SED 08/29/2016 [facility]  . THYROIDECTOMY, PARTIAL Right 1981   middle lobe removed 1st, then right lobe removed 7-8 years later (approx. 1988)    Family History  Problem Relation Age of Onset  . Thyroid disease Mother   . Stroke Father   . Hypertension Brother   . Lung cancer Maternal Uncle   . Cancer Maternal Grandmother     Liver  . Lung cancer Maternal Uncle   . Hypertension Daughter   .  Hypercholesterolemia Daughter   . Hypercholesterolemia Daughter     Social history:  reports that she has never smoked. She has never used smokeless tobacco. She reports that she does not drink alcohol or use drugs.  Medications:  Prior to Admission medications   Medication Sig Start Date End Date Taking? Authorizing Provider  aspirin EC 81 MG tablet Take 81 mg by mouth daily.    Yes Historical Provider, MD  atenolol-chlorthalidone (TENORETIC) 50-25 MG tablet TAKE 1 TABLET BY MOUTH DAILY. 07/03/16  Yes Chipper Herb, MD  atorvastatin (LIPITOR) 20 MG tablet Take 10 mg by mouth at bedtime.    Yes  Historical Provider, MD  calcium-vitamin D (OSCAL WITH D) 500-200 MG-UNIT per tablet Take 1 tablet by mouth daily.     Yes Historical Provider, MD  cloNIDine (CATAPRES) 0.1 MG tablet Take 0.1 mg by mouth See admin instructions. Take 0.1 mg by mouth twice daily. May take 0.1 mg additional daily as needed. 09/13/14  Yes Historical Provider, MD  ferrous sulfate (CVS IRON) 325 (65 FE) MG tablet Take 325 mg by mouth 2 (two) times daily with a meal.   Yes Historical Provider, MD  flecainide (TAMBOCOR) 50 MG tablet Take 1 tablet (50 mg total) by mouth 2 (two) times daily. 06/11/16  Yes Minus Breeding, MD  fluticasone (FLONASE) 50 MCG/ACT nasal spray Place 1 spray into both nostrils 2 (two) times daily as needed for allergies or rhinitis.  06/25/16  Yes Historical Provider, MD  hydrALAZINE (APRESOLINE) 50 MG tablet Take 50 mg by mouth 2 (two) times daily.    Yes Historical Provider, MD  hydroxypropyl methylcellulose / hypromellose (ISOPTO TEARS / GONIOVISC) 2.5 % ophthalmic solution Place 1 drop into both eyes as needed for dry eyes.   Yes Historical Provider, MD  levothyroxine (SYNTHROID, LEVOTHROID) 75 MCG tablet TAKE 1 TABLET (75 MCG TOTAL) BY MOUTH DAILY. 11/26/15  Yes Chipper Herb, MD  lisinopril (PRINIVIL,ZESTRIL) 40 MG tablet Take 40 mg by mouth daily.   Yes Historical Provider, MD  Omega-3 Fatty Acids (FISH OIL) 1200 MG CAPS Take 1,200 mg by mouth daily.   Yes Historical Provider, MD  polyethylene glycol powder (GLYCOLAX/MIRALAX) powder Take 17 g by mouth at bedtime.   Yes Historical Provider, MD      Allergies  Allergen Reactions  . Amlodipine Other (See Comments)    edema  . Isosorbide Nitrate Nausea Only  . Sulfonamide Derivatives Nausea Only and Rash  . Terazosin Hives and Nausea Only    ROS:  Out of a complete 14 system review of symptoms, [name] complains only of Janet following symptoms, and all other reviewed systems are negative.  Easy bruising, easy bleeding Skin  sensitivity Dizziness  Blood pressure (!) 196/68, pulse 67, height 5\' 6"  (1.676 m), weight 127 lb 12 oz (57.9 kg).   Blood pressure, right arm, sitting is 186/90. Blood pressure, right arm, standing is 192/80.  Physical Exam  General: [name] is alert and cooperative at Janet time of Janet examination.  Eyes: Pupils are equal, round, and reactive to light. Discs are flat bilaterally.  Neck: Janet neck is supple, no carotid bruits are noted.  Respiratory: Janet respiratory examination is clear.  Cardiovascular: Janet cardiovascular examination reveals a regular rate and rhythm, no obvious murmurs or rubs are noted.  Skin: Extremities are without significant edema.  Neurologic Exam  Mental status: [name] is alert and oriented x 3 at Janet time of Janet examination. [name] has apparent normal  recent and remote memory, with an apparently normal attention span and concentration ability.  Cranial nerves: Facial symmetry is present. There is good sensation of Janet face to pinprick and soft touch bilaterally. Janet strength of Janet facial muscles and Janet muscles to head turning and shoulder shrug are normal bilaterally. Speech is well enunciated, no aphasia or dysarthria is noted. Extraocular movements are full. Visual fields are full. Janet tongue is midline, and [name] has symmetric elevation of Janet soft palate. No obvious hearing deficits are noted. With lateral gaze to Janet right, there is some nystagmus with right sided directional fast phase.  Motor: Janet motor testing reveals 5 over 5 strength of all 4 extremities. Good symmetric motor tone is noted throughout.  Sensory: Sensory testing is intact to pinprick, soft touch, vibration sensation, and position sense on all 4 extremities. No evidence of extinction is noted.  Coordination: Cerebellar testing reveals good finger-nose-finger and heel-to-shin bilaterally. Nyan-Barrany procedure was unremarkable.  Gait and station: Gait is normal.  Tandem gait is normal. Romberg is negative. No drift is seen.  Reflexes: Deep tendon reflexes are symmetric and normal bilaterally. Toes are downgoing bilaterally.   Assessment/Plan:  1. Episodic vertigo  2. Hypertension  [name] has begun to have episodes of vertigo with sudden onset, associated with a rocking sensation and prominent nausea and vomiting lasting about one hour. [name] may feel queasy for about 24 hours afterwards. Only one episode was associated with Janet headache. [name] has no prior history of migraine, Janet episodes do not correlate with benign positional vertigo, and [name] reports no hearing changes that would be suggestive of Mnire's disease. [name] does have labile blood pressures, she does have risk factors for cerebrovascular disease. [name] will be sent for MRI of Janet brain, and MRA of Janet head. [name] will have blood work today to include a sedimentation rate. Meclizine likely would be of little benefit as [name] has prominent nausea and vomiting as Janet episode starts. She will be seen back within Janet next 2 or 3 months, I will contact her regarding Janet results of Janet above studies. Janet episodes of vertigo are most suggestive of a true vestibular disorder. Slight nystagmus seen on clinical examination is asymmetric, more prominent when looking to Janet right.  Jill Alexanders MD 09/09/2016 8:00 AM  [facility] [address] [place], [place] [address]  Phone [phone] Fax [phone]

## 2016-09-09 NOTE — Telephone Encounter (Signed)
Okay to give Tamiflu 75 twice a day for 5 day

## 2016-09-10 LAB — ANA W/REFLEX: Anti Nuclear Antibody(ANA): NEGATIVE

## 2016-09-10 LAB — SEDIMENTATION RATE: SED RATE: 14 mm/h (ref 0–40)

## 2016-09-11 ENCOUNTER — Telehealth: Payer: Self-pay | Admitting: *Deleted

## 2016-09-11 NOTE — Telephone Encounter (Signed)
Per Dr Jannifer Franklin, spoke with patient and informed her that her lab results are unremarkable. She verbalized understanding, appreciation.

## 2016-09-14 ENCOUNTER — Encounter (HOSPITAL_COMMUNITY): Payer: Self-pay | Admitting: Hematology & Oncology

## 2016-09-17 ENCOUNTER — Ambulatory Visit
Admission: RE | Admit: 2016-09-17 | Discharge: 2016-09-17 | Disposition: A | Discharge status: Home / Self Care | Payer: PPO | Source: Ambulatory Visit | Attending: Neurology | Admitting: Neurology

## 2016-09-17 DIAGNOSIS — R42 Dizziness and giddiness: Secondary | ICD-10-CM | POA: Diagnosis not present

## 2016-09-18 ENCOUNTER — Encounter (HOSPITAL_COMMUNITY): Payer: Self-pay | Admitting: Hematology & Oncology

## 2016-09-19 ENCOUNTER — Telehealth: Payer: Self-pay | Admitting: Neurology

## 2016-09-19 NOTE — Telephone Encounter (Signed)
I called patient. MRI the brain shows mild small vessel disease, may be some involvement of the brainstem. MRA of the head is unremarkable. Clinically, the patient sounds as if she has a peripheral cause of vertigo. She has a rocking sensation and severe nausea and vomiting with the events, even that may last 24 hours and then go away. The patient has not had any events since I have seen her in the office. Hopefully, the events will contact you to improve and completely disappeared in the future. I will follow-up with her in May, but she is to call she continues to have regular episodes of vertigo. We may give a trial on either meclizine or diazepam on a scheduled basis.   MRI brain 09/18/16:  IMPRESSION:  Abnormal MRI scan of the brain showing moderate changes of chronic microvascular ischemia and mild degree of generalized cerebral atrophy. These appear more advanced compared with previous MRI scan dated 04/03/2014   MRA head 09/18/16:  IMPRESSION:  Abnormal MRA of the brain showing mild narrowing of the M1 segment of the right middle cerebral artery. 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

## 2016-09-22 ENCOUNTER — Ambulatory Visit: Payer: PPO | Admitting: Neurology

## 2016-09-23 DIAGNOSIS — N2889 Other specified disorders of kidney and ureter: Secondary | ICD-10-CM | POA: Diagnosis not present

## 2016-09-23 DIAGNOSIS — E785 Hyperlipidemia, unspecified: Secondary | ICD-10-CM | POA: Diagnosis not present

## 2016-09-23 DIAGNOSIS — I129 Hypertensive chronic kidney disease with stage 1 through stage 4 chronic kidney disease, or unspecified chronic kidney disease: Secondary | ICD-10-CM | POA: Diagnosis not present

## 2016-09-23 DIAGNOSIS — N183 Chronic kidney disease, stage 3 (moderate): Secondary | ICD-10-CM | POA: Diagnosis not present

## 2016-09-29 ENCOUNTER — Other Ambulatory Visit: Payer: Self-pay | Admitting: Family Medicine

## 2016-10-07 ENCOUNTER — Other Ambulatory Visit: Payer: Self-pay | Admitting: Family Medicine

## 2016-11-14 ENCOUNTER — Other Ambulatory Visit: Payer: Self-pay | Admitting: Family Medicine

## 2016-11-17 ENCOUNTER — Other Ambulatory Visit: Payer: Self-pay | Admitting: Family Medicine

## 2016-11-17 NOTE — Telephone Encounter (Signed)
Authorize 30 days only. Then contact the patient letting them know that they will need an appointment before any further prescriptions can be sent in. 

## 2016-11-21 DIAGNOSIS — R040 Epistaxis: Secondary | ICD-10-CM | POA: Diagnosis not present

## 2016-11-22 ENCOUNTER — Emergency Department (HOSPITAL_COMMUNITY)
Admission: EM | Admit: 2016-11-22 | Discharge: 2016-11-22 | Disposition: A | Discharge status: Home / Self Care | Payer: PPO | Attending: Emergency Medicine | Admitting: Emergency Medicine

## 2016-11-22 ENCOUNTER — Encounter (HOSPITAL_COMMUNITY): Payer: Self-pay

## 2016-11-22 DIAGNOSIS — N183 Chronic kidney disease, stage 3 (moderate): Secondary | ICD-10-CM | POA: Insufficient documentation

## 2016-11-22 DIAGNOSIS — I129 Hypertensive chronic kidney disease with stage 1 through stage 4 chronic kidney disease, or unspecified chronic kidney disease: Secondary | ICD-10-CM | POA: Insufficient documentation

## 2016-11-22 DIAGNOSIS — Z7982 Long term (current) use of aspirin: Secondary | ICD-10-CM | POA: Diagnosis not present

## 2016-11-22 DIAGNOSIS — Z79899 Other long term (current) drug therapy: Secondary | ICD-10-CM | POA: Insufficient documentation

## 2016-11-22 DIAGNOSIS — E039 Hypothyroidism, unspecified: Secondary | ICD-10-CM | POA: Diagnosis not present

## 2016-11-22 DIAGNOSIS — J45909 Unspecified asthma, uncomplicated: Secondary | ICD-10-CM | POA: Insufficient documentation

## 2016-11-22 DIAGNOSIS — R040 Epistaxis: Secondary | ICD-10-CM | POA: Insufficient documentation

## 2016-11-22 MED ORDER — SILVER NITRATE-POT NITRATE 75-25 % EX MISC
1.0000 "application " | Freq: Once | CUTANEOUS | Status: AC
  Administered 2016-11-22: 1 via TOPICAL

## 2016-11-22 MED ORDER — DOUBLE ANTIBIOTIC 500-10000 UNIT/GM EX OINT: TOPICAL_OINTMENT | Freq: Once | CUTANEOUS | Status: DC

## 2016-11-22 MED ORDER — BACITRACIN ZINC 500 UNIT/GM EX OINT
TOPICAL_OINTMENT | CUTANEOUS | Status: AC
  Administered 2016-11-22: 13:00:00
  Filled 2016-11-22: qty 4.5

## 2016-11-22 MED ORDER — SILVER NITRATE-POT NITRATE 75-25 % EX MISC
CUTANEOUS | Status: AC
  Administered 2016-11-22: 1 via TOPICAL
  Filled 2016-11-22: qty 2

## 2016-11-22 NOTE — ED Notes (Signed)
nad noted prior to dc. Dc instructions reviewed and explained. Voiced understanding.  

## 2016-11-22 NOTE — Discharge Instructions (Signed)
See your ENT doctor as needed for recurrent bleeding ER if you can't get the bleeding to stop with gentle pressure

## 2016-11-22 NOTE — ED Provider Notes (Signed)
[facility] DEPT Provider Note   CSN: [id number] Arrival date & time: 11/22/16  1020     History   Chief Complaint Chief Complaint  Patient presents with  . Epistaxis    HPI [name] is a 83 y.o. female.  HPI  The patient is a 83 year old female with a history of recurrent epistaxis, she also has a known history of hypertension and chronic kidney disease. She had presented to the otolaryngologist office yesterday where she was seen by the physician and had some cautery done of some areas in the right nostril which had caused some bleeding intermittently over time. She did not have any active bleeding yesterday, last night she was doing well however this morning she noticed some recurrent bleeding from the right nostril. She could not get it to stop so she packed her nose with some tissue and has not had any bleeding since over the last 2 hours. She denies pain, denies any bleeding going down her throat, denies being on any anticoagulants.  Past Medical History:  Diagnosis Date  . Anemia   . Arthritis   . Asthma   . CKD (chronic kidney disease), stage III   . Hyperlipidemia   . Hypertension   . Hypothyroidism   . Nodule of right lung    Right upper lobe  . Renal insufficiency    Chronic  . Renal vascular disease   . Right renal artery stenosis ([place]) 05/09/2015  . Vertigo     Patient Active Problem List   Diagnosis Date Noted  . Vertigo 09/09/2016  . Renal vascular disease 10/31/2015  . Nodule of right lung 10/31/2015  . Asthma without status asthmaticus 10/31/2015  . Essential hypertension, malignant   . Hypothyroidism 10/15/2015  . Pancytopenia ([place]) 10/15/2015  . Hyponatremia 10/14/2015  . Thrombocytopenia ([place]) 06/16/2015  . Right renal artery stenosis ([place]) 05/09/2015  . Chronic diastolic heart failure ([place]) 04/03/2014  . Acute diastolic heart failure ([place]) 04/03/2014  . Hypertensive urgency 04/02/2014  . Headache 04/02/2014  . CKD (chronic kidney  disease) stage 3, GFR 30-59 ml/min 04/02/2014  . Paroxysmal atrial fibrillation ([place]) 01/25/2014  . Hypokalemia 07/19/2013  . Anemia 07/19/2013  . Bilateral lower extremity edema 06/30/2013  . Hypertension with fluid overload 06/30/2013  . Need for prophylactic vaccination and inoculation against influenza 05/31/2013  . Pedal edema 05/10/2013  . Varicose veins of lower extremities with other complications 37/85/8850  . ABNORMAL STRESS ELECTROCARDIOGRAM 01/23/2010  . Hyperlipidemia 12/18/2009  . Essential hypertension 12/18/2009  . RENAL INSUFFICIENCY, CHRONIC 12/18/2009  . ARTHRITIS 12/18/2009    Past Surgical History:  Procedure Laterality Date  . IR GENERIC HISTORICAL  08/29/2016   IR US GUIDE VASC ACCESS RIGHT 08/29/2016 [facility]  . IR GENERIC HISTORICAL  08/29/2016   IR RENAL BILAT S&I MOD SED 08/29/2016 [facility]  . THYROIDECTOMY, PARTIAL Right 1981   middle lobe removed 1st, then right lobe removed 7-8 years later (approx. 1988)    OB History    No data available       Home Medications    Prior to Admission medications   Medication Sig Start Date End Date Taking? Authorizing Provider  aspirin EC 81 MG tablet Take 81 mg by mouth every other day.    Yes Historical Provider, MD  atenolol-chlorthalidone (TENORETIC) 50-25 MG tablet TAKE 1 TABLET BY MOUTH DAILY. 07/03/16  Yes Chipper Herb, MD  atorvastatin (LIPITOR) 20 MG tablet Take 10 mg by mouth at bedtime.  Yes Historical Provider, MD  calcium-vitamin D (OSCAL WITH D) 500-200 MG-UNIT per tablet Take 1 tablet by mouth daily.     Yes Historical Provider, MD  cholecalciferol (VITAMIN D) 1000 units tablet Take 1,000 Units by mouth daily.   Yes Historical Provider, MD  cloNIDine (CATAPRES) 0.1 MG tablet Take 0.1 mg by mouth at bedtime. additional daily as needed. 09/13/14  Yes Historical Provider, MD  ferrous sulfate ([employer] IRON) 325 (65 FE) MG tablet Take 325 mg by mouth 2 (two) times daily with a meal.   Yes  Historical Provider, MD  flecainide (TAMBOCOR) 50 MG tablet Take 1 tablet (50 mg total) by mouth 2 (two) times daily. 06/11/16  Yes Minus Breeding, MD  fluticasone (FLONASE) 50 MCG/ACT nasal spray Place 1 spray into both nostrils 2 (two) times daily as needed for allergies or rhinitis.  06/25/16  Yes Historical Provider, MD  hydrALAZINE (APRESOLINE) 50 MG tablet Take 50 mg by mouth 2 (two) times daily.    Yes Historical Provider, MD  hydroxypropyl methylcellulose / hypromellose (ISOPTO TEARS / GONIOVISC) 2.5 % ophthalmic solution Place 1 drop into both eyes as needed for dry eyes.   Yes Historical Provider, MD  levothyroxine (SYNTHROID, LEVOTHROID) 75 MCG tablet TAKE 1 TABLET (75 MCG TOTAL) BY MOUTH DAILY. 11/18/16  Yes Chipper Herb, MD  lisinopril (PRINIVIL,ZESTRIL) 40 MG tablet TAKE 1 TABLET BY MOUTH TWICE A DAY 09/29/16  Yes Chipper Herb, MD  Omega-3 Fatty Acids (FISH OIL) 1200 MG CAPS Take 1,200 mg by mouth daily.   Yes Historical Provider, MD  polyethylene glycol powder (GLYCOLAX/MIRALAX) powder Take 17 g by mouth at bedtime.   Yes Historical Provider, MD  sodium chloride (OCEAN) 0.65 % SOLN nasal spray Place 1 spray into both nostrils as needed for congestion.   Yes Historical Provider, MD  oseltamivir (TAMIFLU) 75 MG capsule Take 1 capsule (75 mg total) by mouth 2 (two) times daily. Patient not taking: Reported on 11/22/2016 09/09/16   Wardell Honour, MD    Family History Family History  Problem Relation Age of Onset  . Thyroid disease Mother   . Stroke Father   . Hypertension Brother   . Lung cancer Maternal Uncle   . Cancer Maternal Grandmother     Liver  . Lung cancer Maternal Uncle   . Hypertension Daughter   . Hypercholesterolemia Daughter   . Hypercholesterolemia Daughter     Social History Social History  Substance Use Topics  . Smoking status: Never Smoker  . Smokeless tobacco: Never Used     Comment: Pt denies cigarettes  . Alcohol use No     Allergies     Amlodipine; Isosorbide nitrate; Sulfonamide derivatives; and Terazosin   Review of Systems Review of Systems  HENT: Positive for nosebleeds.   Neurological: Negative for headaches.     Physical Exam Updated Vital Signs BP (!) 214/63 (BP Location: Left Arm)   Pulse 62   Temp 97.4 F (36.3 C) (Oral)   Resp 16   Wt 129 lb (58.5 kg)   SpO2 96%   BMI 20.82 kg/m   Physical Exam  Constitutional: She appears well-developed and well-nourished.  HENT:  Head: Normocephalic and atraumatic.  Has small area in the R nare with bleeding (minimally) around the cauterized septal area.  No other bleeding, no clot, patent otherwise.  Eyes: Conjunctivae are normal. Right eye exhibits no discharge. Left eye exhibits no discharge.  Cardiovascular: Normal rate and regular rhythm.   Normal pulses  Pulmonary/Chest: Effort normal. No respiratory distress.  Neurological: She is alert. Coordination normal.  Skin: Skin is warm and dry. No rash noted. She is not diaphoretic. No erythema.  Psychiatric: She has a normal mood and affect.  Nursing note and vitals reviewed.    ED Treatments / Results  Labs (all labs ordered are listed, but only abnormal results are displayed) Labs Reviewed - No data to display   Radiology No results found.  Procedures .Epistaxis Management Date/Time: 11/22/2016 10:52 AM Performed by: Noemi Chapel Authorized by: Noemi Chapel   Consent:    Consent obtained:  Verbal   Consent given by:  Patient   Risks discussed:  Bleeding and nasal injury (Injury or perforation of the nasal septum)   Alternatives discussed:  No treatment, alternative treatment, referral, observation and delayed treatment Anesthesia (see MAR for exact dosages):    Anesthesia method:  None Procedure details:    Treatment site:  R anterior   Treatment method:  Silver nitrate   Treatment complexity:  Limited   Treatment episode: recurring   Post-procedure details:    Assessment:  Bleeding  stopped   Patient tolerance of procedure:  Tolerated well, no immediate complications   (including critical care time)  Medications Ordered in ED Medications  silver nitrate applicators applicator 1 application (1 application Topical Given 11/22/16 1046)   Initial Impression / Assessment and Plan / ED Course  I have reviewed the triage vital signs and the nursing notes.  Pertinent labs & imaging results that were available during my care of the patient were reviewed by me and considered in my medical decision making (see chart for details).  The pt has had a small am't of epistaxis - cauterized successfully No active bleeding  Rechecked at 1 hour - no rebleed,stable for d/c  Final Clinical Impressions(s) / ED Diagnoses   Final diagnoses:  Right-sided epistaxis    New Prescriptions New Prescriptions   No medications on file     Noemi Chapel, MD 11/22/16 1223

## 2016-11-22 NOTE — ED Triage Notes (Signed)
History of nose bleeds. Had cauterization yesterday pt got up to go to bathroom in middle of night and noticed nose was bleeding again. Rt nare currently packed. No signs of bleeding at this time.

## 2016-11-23 ENCOUNTER — Encounter (HOSPITAL_COMMUNITY): Payer: Self-pay | Admitting: Emergency Medicine

## 2016-11-23 ENCOUNTER — Observation Stay (HOSPITAL_COMMUNITY)
Admission: EM | Admit: 2016-11-23 | Discharge: 2016-11-24 | Disposition: A | Discharge status: Home / Self Care | Payer: PPO | Attending: Otolaryngology | Admitting: Otolaryngology

## 2016-11-23 DIAGNOSIS — E785 Hyperlipidemia, unspecified: Secondary | ICD-10-CM | POA: Diagnosis not present

## 2016-11-23 DIAGNOSIS — R040 Epistaxis: Secondary | ICD-10-CM | POA: Diagnosis not present

## 2016-11-23 DIAGNOSIS — Z79899 Other long term (current) drug therapy: Secondary | ICD-10-CM | POA: Diagnosis not present

## 2016-11-23 DIAGNOSIS — Z7982 Long term (current) use of aspirin: Secondary | ICD-10-CM | POA: Diagnosis not present

## 2016-11-23 DIAGNOSIS — E039 Hypothyroidism, unspecified: Secondary | ICD-10-CM | POA: Diagnosis not present

## 2016-11-23 DIAGNOSIS — I129 Hypertensive chronic kidney disease with stage 1 through stage 4 chronic kidney disease, or unspecified chronic kidney disease: Secondary | ICD-10-CM | POA: Diagnosis not present

## 2016-11-23 DIAGNOSIS — D649 Anemia, unspecified: Secondary | ICD-10-CM | POA: Diagnosis not present

## 2016-11-23 DIAGNOSIS — N183 Chronic kidney disease, stage 3 (moderate): Secondary | ICD-10-CM | POA: Diagnosis not present

## 2016-11-23 LAB — CBC WITH DIFFERENTIAL/PLATELET
Basophils Absolute: 0 10*3/uL (ref 0.0–0.1)
Basophils Relative: 0 %
EOS ABS: 0 10*3/uL (ref 0.0–0.7)
EOS PCT: 0 %
HCT: 35.8 % — ABNORMAL LOW (ref 36.0–46.0)
Hemoglobin: 12.3 g/dL (ref 12.0–15.0)
LYMPHS ABS: 0.8 10*3/uL (ref 0.7–4.0)
LYMPHS PCT: 19 %
MCH: 30.1 pg (ref 26.0–34.0)
MCHC: 34.4 g/dL (ref 30.0–36.0)
MCV: 87.5 fL (ref 78.0–100.0)
MONO ABS: 0.4 10*3/uL (ref 0.1–1.0)
Monocytes Relative: 9 %
Neutro Abs: 3.3 10*3/uL (ref 1.7–7.7)
Neutrophils Relative %: 72 %
PLATELETS: 153 10*3/uL (ref 150–400)
RBC: 4.09 MIL/uL (ref 3.87–5.11)
RDW: 13.3 % (ref 11.5–15.5)
WBC: 4.5 10*3/uL (ref 4.0–10.5)

## 2016-11-23 LAB — BASIC METABOLIC PANEL
Anion gap: 9 (ref 5–15)
BUN: 25 mg/dL — AB (ref 6–20)
CALCIUM: 9.4 mg/dL (ref 8.9–10.3)
CO2: 30 mmol/L (ref 22–32)
CREATININE: 1.36 mg/dL — AB (ref 0.44–1.00)
Chloride: 96 mmol/L — ABNORMAL LOW (ref 101–111)
GFR calc Af Amer: 41 mL/min — ABNORMAL LOW (ref >60)
GFR, EST NON AFRICAN AMERICAN: 36 mL/min — AB (ref >60)
GLUCOSE: 122 mg/dL — AB (ref 65–99)
Potassium: 3.7 mmol/L (ref 3.5–5.1)
SODIUM: 135 mmol/L (ref 135–145)

## 2016-11-23 MED ORDER — POLYETHYLENE GLYCOL 3350 17 G PO PACK
17.0000 g | PACK | Freq: Every day | ORAL | Status: DC
  Administered 2016-11-24: 17 g via ORAL
  Filled 2016-11-23: qty 1

## 2016-11-23 MED ORDER — CLONIDINE HCL 0.1 MG PO TABS
0.1000 mg | ORAL_TABLET | Freq: Every day | ORAL | Status: DC
  Administered 2016-11-24: 0.1 mg via ORAL
  Filled 2016-11-23: qty 1

## 2016-11-23 MED ORDER — CALCIUM CARBONATE-VITAMIN D 500-200 MG-UNIT PO TABS
1.0000 | ORAL_TABLET | Freq: Every day | ORAL | Status: DC
  Administered 2016-11-24: 1 via ORAL
  Filled 2016-11-23: qty 1

## 2016-11-23 MED ORDER — VITAMIN D 1000 UNITS PO TABS
1000.0000 [IU] | ORAL_TABLET | Freq: Every day | ORAL | Status: DC
  Administered 2016-11-24: 1000 [IU] via ORAL
  Filled 2016-11-23: qty 1

## 2016-11-23 MED ORDER — PANTOPRAZOLE SODIUM 40 MG IV SOLR
40.0000 mg | Freq: Once | INTRAVENOUS | Status: AC
  Administered 2016-11-23: 40 mg via INTRAVENOUS
  Filled 2016-11-23: qty 40

## 2016-11-23 MED ORDER — SODIUM CHLORIDE 0.9% FLUSH: 3.0000 mL | Freq: Two times a day (BID) | INTRAVENOUS | Status: DC

## 2016-11-23 MED ORDER — SODIUM CHLORIDE 0.9 % IV BOLUS (SEPSIS)
1000.0000 mL | Freq: Once | INTRAVENOUS | Status: AC
  Administered 2016-11-23: 1000 mL via INTRAVENOUS

## 2016-11-23 MED ORDER — OXYMETAZOLINE HCL 0.05 % NA SOLN
1.0000 | Freq: Once | NASAL | Status: AC
  Administered 2016-11-23: 1 via NASAL
  Filled 2016-11-23: qty 15

## 2016-11-23 MED ORDER — ASPIRIN EC 81 MG PO TBEC: 81.0000 mg | DELAYED_RELEASE_TABLET | ORAL | Status: DC

## 2016-11-23 MED ORDER — SALINE SPRAY 0.65 % NA SOLN
1.0000 | Freq: Two times a day (BID) | NASAL | Status: DC
  Filled 2016-11-23: qty 44

## 2016-11-23 MED ORDER — FLECAINIDE ACETATE 50 MG PO TABS
50.0000 mg | ORAL_TABLET | Freq: Two times a day (BID) | ORAL | Status: DC
  Administered 2016-11-24: 50 mg via ORAL
  Filled 2016-11-23 (×2): qty 1

## 2016-11-23 MED ORDER — FLUTICASONE PROPIONATE 50 MCG/ACT NA SUSP
1.0000 | Freq: Two times a day (BID) | NASAL | Status: DC | PRN
  Filled 2016-11-23: qty 16

## 2016-11-23 MED ORDER — CHLORTHALIDONE 25 MG PO TABS
25.0000 mg | ORAL_TABLET | Freq: Every day | ORAL | Status: DC
  Filled 2016-11-23: qty 1

## 2016-11-23 MED ORDER — CLONIDINE HCL 0.1 MG PO TABS: 0.1000 mg | ORAL_TABLET | Freq: Every day | ORAL | Status: DC | PRN

## 2016-11-23 MED ORDER — SODIUM CHLORIDE 0.9 % IV SOLN: 250.0000 mL | INTRAVENOUS | Status: DC | PRN

## 2016-11-23 MED ORDER — ATENOLOL-CHLORTHALIDONE 50-25 MG PO TABS: 1.0000 | ORAL_TABLET | Freq: Every day | ORAL | Status: DC

## 2016-11-23 MED ORDER — ONDANSETRON HCL 4 MG/2ML IJ SOLN
4.0000 mg | Freq: Once | INTRAMUSCULAR | Status: AC
  Administered 2016-11-23: 4 mg via INTRAVENOUS
  Filled 2016-11-23: qty 2

## 2016-11-23 MED ORDER — SODIUM CHLORIDE 0.9% FLUSH: 3.0000 mL | INTRAVENOUS | Status: DC | PRN

## 2016-11-23 MED ORDER — OMEGA-3-ACID ETHYL ESTERS 1 G PO CAPS
1.0000 g | ORAL_CAPSULE | Freq: Every day | ORAL | Status: DC
  Administered 2016-11-24: 1 g via ORAL
  Filled 2016-11-23: qty 1

## 2016-11-23 MED ORDER — ASPIRIN EC 81 MG PO TBEC: 81.0000 mg | DELAYED_RELEASE_TABLET | Freq: Once | ORAL | Status: DC

## 2016-11-23 MED ORDER — ATENOLOL 50 MG PO TABS
50.0000 mg | ORAL_TABLET | Freq: Every day | ORAL | Status: DC
  Filled 2016-11-23: qty 1

## 2016-11-23 MED ORDER — LISINOPRIL 40 MG PO TABS
40.0000 mg | ORAL_TABLET | Freq: Every day | ORAL | Status: DC
Start: 2016-11-24 — End: 2016-11-24
  Administered 2016-11-24: 40 mg via ORAL
  Filled 2016-11-23: qty 1

## 2016-11-23 MED ORDER — ACETAMINOPHEN 325 MG PO TABS
650.0000 mg | ORAL_TABLET | Freq: Four times a day (QID) | ORAL | Status: DC | PRN
  Administered 2016-11-24: 650 mg via ORAL
  Filled 2016-11-23: qty 2

## 2016-11-23 MED ORDER — HYPROMELLOSE (GONIOSCOPIC) 2.5 % OP SOLN
1.0000 [drp] | Freq: Every day | OPHTHALMIC | Status: DC
  Filled 2016-11-23: qty 15

## 2016-11-23 MED ORDER — ATORVASTATIN CALCIUM 10 MG PO TABS
10.0000 mg | ORAL_TABLET | Freq: Every day | ORAL | Status: DC
  Administered 2016-11-24: 10 mg via ORAL
  Filled 2016-11-23 (×2): qty 1

## 2016-11-23 MED ORDER — POLYETHYLENE GLYCOL 3350 17 GM/SCOOP PO POWD: 17.0000 g | Freq: Every day | ORAL | Status: DC

## 2016-11-23 MED ORDER — FERROUS SULFATE 325 (65 FE) MG PO TABS
325.0000 mg | ORAL_TABLET | Freq: Two times a day (BID) | ORAL | Status: DC
  Administered 2016-11-24: 325 mg via ORAL
  Filled 2016-11-23: qty 1

## 2016-11-23 MED ORDER — IBUPROFEN 400 MG PO TABS: 400.0000 mg | ORAL_TABLET | Freq: Four times a day (QID) | ORAL | Status: DC | PRN

## 2016-11-23 MED ORDER — HYDRALAZINE HCL 50 MG PO TABS
50.0000 mg | ORAL_TABLET | Freq: Two times a day (BID) | ORAL | Status: DC
  Administered 2016-11-24 (×2): 50 mg via ORAL
  Filled 2016-11-23 (×2): qty 1

## 2016-11-23 MED ORDER — ACETAMINOPHEN 650 MG RE SUPP: 650.0000 mg | Freq: Four times a day (QID) | RECTAL | Status: DC | PRN

## 2016-11-23 MED ORDER — AMOXICILLIN 250 MG PO CAPS
250.0000 mg | ORAL_CAPSULE | Freq: Three times a day (TID) | ORAL | Status: DC
  Administered 2016-11-24 (×2): 250 mg via ORAL
  Filled 2016-11-23 (×3): qty 1

## 2016-11-23 MED ORDER — PROMETHAZINE HCL 25 MG PO TABS: 12.5000 mg | ORAL_TABLET | Freq: Four times a day (QID) | ORAL | Status: DC | PRN

## 2016-11-23 MED ORDER — LEVOTHYROXINE SODIUM 75 MCG PO TABS
75.0000 ug | ORAL_TABLET | Freq: Every day | ORAL | Status: DC
  Administered 2016-11-24: 75 ug via ORAL
  Filled 2016-11-23: qty 1

## 2016-11-23 NOTE — Consult Note (Signed)
[name] is an 83 y.o. female.   Chief Complaint: Nosebleed HPI: She has been having recurrent right-sided nosebleed since Friday. She saw Dr. Janace Hoard in our office Friday had cauterization performed. She denies emergency room twice over the weekend. Packing in the right side. She is not on any nasal spray. She uses saline on occasion. She does suffer with hypertension.  Past Medical History:  Diagnosis Date  . Anemia   . Arthritis   . Asthma   . CKD (chronic kidney disease), stage III   . Hyperlipidemia   . Hypertension   . Hypothyroidism   . Nodule of right lung    Right upper lobe  . Renal insufficiency    Chronic  . Renal vascular disease   . Right renal artery stenosis ([place]) 05/09/2015  . Vertigo     Past Surgical History:  Procedure Laterality Date  . IR GENERIC HISTORICAL  08/29/2016   IR US GUIDE VASC ACCESS RIGHT 08/29/2016 [facility]  . IR GENERIC HISTORICAL  08/29/2016   IR RENAL BILAT S&I MOD SED 08/29/2016 [facility]  . THYROIDECTOMY, PARTIAL Right 1981   middle lobe removed 1st, then right lobe removed 7-8 years later (approx. 1988)    Family History  Problem Relation Age of Onset  . Thyroid disease Mother   . Stroke Father   . Hypertension Brother   . Lung cancer Maternal Uncle   . Cancer Maternal Grandmother     Liver  . Lung cancer Maternal Uncle   . Hypertension Daughter   . Hypercholesterolemia Daughter   . Hypercholesterolemia Daughter    Social History:  reports that she has never smoked. She has never used smokeless tobacco. She reports that she does not drink alcohol or use drugs.  Allergies:  Allergies  Allergen Reactions  . Amlodipine Other (See Comments)    edema  . Isosorbide Nitrate Nausea Only  . Sulfonamide Derivatives Nausea Only and Rash  . Terazosin Hives and Nausea Only     (Not in a hospital admission)  Results for orders placed or performed during the hospital encounter of 11/23/16 (from the past 48 hour(s))  CBC  with Differential     Status: Abnormal   Collection Time: 11/23/16  6:19 PM  Result Value Ref Range   WBC 4.5 4.0 - 10.5 K/uL   RBC 4.09 3.87 - 5.11 MIL/uL   Hemoglobin 12.3 12.0 - 15.0 g/dL   HCT 35.8 (L) 36.0 - 46.0 %   MCV 87.5 78.0 - 100.0 fL   MCH 30.1 26.0 - 34.0 pg   MCHC 34.4 30.0 - 36.0 g/dL   RDW 13.3 11.5 - 15.5 %   Platelets 153 150 - 400 K/uL   Neutrophils Relative % 72 %   Neutro Abs 3.3 1.7 - 7.7 K/uL   Lymphocytes Relative 19 %   Lymphs Abs 0.8 0.7 - 4.0 K/uL   Monocytes Relative 9 %   Monocytes Absolute 0.4 0.1 - 1.0 K/uL   Eosinophils Relative 0 %   Eosinophils Absolute 0.0 0.0 - 0.7 K/uL   Basophils Relative 0 %   Basophils Absolute 0.0 0.0 - 0.1 K/uL  Basic metabolic panel     Status: Abnormal   Collection Time: 11/23/16  6:19 PM  Result Value Ref Range   Sodium 135 135 - 145 mmol/L   Potassium 3.7 3.5 - 5.1 mmol/L   Chloride 96 (L) 101 - 111 mmol/L   CO2 30 22 - 32 mmol/L   Glucose,  Bld 122 (H) 65 - 99 mg/dL   BUN 25 (H) 6 - 20 mg/dL   Creatinine, Ser 1.36 (H) 0.44 - 1.00 mg/dL   Calcium 9.4 8.9 - 10.3 mg/dL   GFR calc non Af Amer 36 (L) >60 mL/min   GFR calc Af Amer 41 (L) >60 mL/min    Comment: (NOTE) The eGFR has been calculated using the CKD EPI equation. This calculation has not been validated in all clinical situations. eGFR's persistently <60 mL/min signify possible Chronic Kidney Disease.    Anion gap 9 5 - 15   No results found.  ROS: otherwise negative  Blood pressure (!) 199/66, pulse 72, temperature 98.3 F (36.8 C), temperature source Oral, resp. rate 18, height _0  (1.676 m), weight 58.5 kg (129 lb), SpO2 95 %.  PHYSICAL EXAM: Overall appearance:  Healthy appearing, in no distress Head:  Normocephalic, atraumatic. Ears: External ears look healthy. Nose: External nose is healthy in appearance. Packing in the right side. Oral Cavity/pharynx:  There are no mucosal lesions or masses identified. Neuro:  No identifiable neurologic  deficits. Neck: No palpable neck masses.  Studies Reviewed: none  Procedure: The packing was removed from the right nasal cavity. Topical Xylocaine/Afrin was applied and spray form and pledgets. Suction was used to clear out all blood clot and active bleeding from the right nasal cavity. Left nasal cavity looks clear and healthy. There is a bleeding site in the anterior septum on the right that was extensively cauterized with silver nitrate sticks. There seemed to be additional bleeding coming from more posterior but I was unable to visualize the source. 1% Xylocaine with epinephrine was infiltrated into the septum the middle and inferior turbinates and a long Merocel packing was placed without difficulty. A piece of Surgicel was placed medial to that up against the septum. The packing was inflated with the local anesthetic solution. There is no further bleeding. She tolerated this very well.    Assessment/Plan Intractable epistaxis. Right anterior septum was cauterized. There seemed to be another bleeding site more posterior but I was unable to visualize that. Packing was placed. No further bleeding. Recommend we admit her for the rest of the night for continued observation and blood pressure control.  Faythe Heitzenrater 11/23/2016, 10:00 PM

## 2016-11-23 NOTE — ED Provider Notes (Addendum)
[facility] DEPT Provider Note   CSN: [id number] Arrival date & time: 11/23/16  1604     History   Chief Complaint Chief Complaint  Patient presents with  . Epistaxis    HPI [name] is a 83 y.o. female.  Third visit in 3 days for right-sided epistaxis. She was seen on Friday by Dr. Janace Hoard in the office and was cauterized with silver nitrate. She was seen in the ED yesterday with similar treatment. Today the bleeding restarted at 6 AM. No chest pain or dyspnea.      Past Medical History:  Diagnosis Date  . Anemia   . Arthritis   . Asthma   . CKD (chronic kidney disease), stage III   . Hyperlipidemia   . Hypertension   . Hypothyroidism   . Nodule of right lung    Right upper lobe  . Renal insufficiency    Chronic  . Renal vascular disease   . Right renal artery stenosis ([place]) 05/09/2015  . Vertigo     Patient Active Problem List   Diagnosis Date Noted  . Vertigo 09/09/2016  . Renal vascular disease 10/31/2015  . Nodule of right lung 10/31/2015  . Asthma without status asthmaticus 10/31/2015  . Essential hypertension, malignant   . Hypothyroidism 10/15/2015  . Pancytopenia ([place]) 10/15/2015  . Hyponatremia 10/14/2015  . Thrombocytopenia ([place]) 06/16/2015  . Right renal artery stenosis ([place]) 05/09/2015  . Chronic diastolic heart failure ([place]) 04/03/2014  . Acute diastolic heart failure ([place]) 04/03/2014  . Hypertensive urgency 04/02/2014  . Headache 04/02/2014  . CKD (chronic kidney disease) stage 3, GFR 30-59 ml/min 04/02/2014  . Paroxysmal atrial fibrillation ([place]) 01/25/2014  . Hypokalemia 07/19/2013  . Anemia 07/19/2013  . Bilateral lower extremity edema 06/30/2013  . Hypertension with fluid overload 06/30/2013  . Need for prophylactic vaccination and inoculation against influenza 05/31/2013  . Pedal edema 05/10/2013  . Varicose veins of lower extremities with other complications 22/29/7989  . ABNORMAL STRESS ELECTROCARDIOGRAM 01/23/2010  .  Hyperlipidemia 12/18/2009  . Essential hypertension 12/18/2009  . RENAL INSUFFICIENCY, CHRONIC 12/18/2009  . ARTHRITIS 12/18/2009    Past Surgical History:  Procedure Laterality Date  . IR GENERIC HISTORICAL  08/29/2016   IR US GUIDE VASC ACCESS RIGHT 08/29/2016 [facility]  . IR GENERIC HISTORICAL  08/29/2016   IR RENAL BILAT S&I MOD SED 08/29/2016 [facility]  . THYROIDECTOMY, PARTIAL Right 1981   middle lobe removed 1st, then right lobe removed 7-8 years later (approx. 1988)    OB History    No data available       Home Medications    Prior to Admission medications   Medication Sig Start Date End Date Taking? Authorizing Provider  aspirin EC 81 MG tablet Take 81 mg by mouth every other day.     Historical Provider, MD  atenolol-chlorthalidone (TENORETIC) 50-25 MG tablet TAKE 1 TABLET BY MOUTH DAILY. 07/03/16   Chipper Herb, MD  atorvastatin (LIPITOR) 20 MG tablet Take 10 mg by mouth at bedtime.     Historical Provider, MD  calcium-vitamin D (OSCAL WITH D) 500-200 MG-UNIT per tablet Take 1 tablet by mouth daily.      Historical Provider, MD  cholecalciferol (VITAMIN D) 1000 units tablet Take 1,000 Units by mouth daily.    Historical Provider, MD  cloNIDine (CATAPRES) 0.1 MG tablet Take 0.1 mg by mouth at bedtime. additional daily as needed. 09/13/14   Historical Provider, MD  ferrous sulfate ([employer]S IRON) 325 (65 FE)  MG tablet Take 325 mg by mouth 2 (two) times daily with a meal.    Historical Provider, MD  flecainide (TAMBOCOR) 50 MG tablet Take 1 tablet (50 mg total) by mouth 2 (two) times daily. 06/11/16   Minus Breeding, MD  fluticasone (FLONASE) 50 MCG/ACT nasal spray Place 1 spray into both nostrils 2 (two) times daily as needed for allergies or rhinitis.  06/25/16   Historical Provider, MD  hydrALAZINE (APRESOLINE) 50 MG tablet Take 50 mg by mouth 2 (two) times daily.     Historical Provider, MD  hydroxypropyl methylcellulose / hypromellose (ISOPTO TEARS / GONIOVISC) 2.5 %  ophthalmic solution Place 1 drop into both eyes as needed for dry eyes.    Historical Provider, MD  levothyroxine (SYNTHROID, LEVOTHROID) 75 MCG tablet TAKE 1 TABLET (75 MCG TOTAL) BY MOUTH DAILY. 11/18/16   Chipper Herb, MD  lisinopril (PRINIVIL,ZESTRIL) 40 MG tablet TAKE 1 TABLET BY MOUTH TWICE A DAY 09/29/16   Chipper Herb, MD  Omega-3 Fatty Acids (FISH OIL) 1200 MG CAPS Take 1,200 mg by mouth daily.    Historical Provider, MD  oseltamivir (TAMIFLU) 75 MG capsule Take 1 capsule (75 mg total) by mouth 2 (two) times daily. Patient not taking: Reported on 11/22/2016 09/09/16   Wardell Honour, MD  polyethylene glycol powder (GLYCOLAX/MIRALAX) powder Take 17 g by mouth at bedtime.    Historical Provider, MD  sodium chloride (OCEAN) 0.65 % SOLN nasal spray Place 1 spray into both nostrils as needed for congestion.    Historical Provider, MD    Family History Family History  Problem Relation Age of Onset  . Thyroid disease Mother   . Stroke Father   . Hypertension Brother   . Lung cancer Maternal Uncle   . Cancer Maternal Grandmother     Liver  . Lung cancer Maternal Uncle   . Hypertension Daughter   . Hypercholesterolemia Daughter   . Hypercholesterolemia Daughter     Social History Social History  Substance Use Topics  . Smoking status: Never Smoker  . Smokeless tobacco: Never Used     Comment: Pt denies cigarettes  . Alcohol use No     Allergies   Amlodipine; Isosorbide nitrate; Sulfonamide derivatives; and Terazosin   Review of Systems Review of Systems  All other systems reviewed and are negative.    Physical Exam Updated Vital Signs BP (S) (!) 242/73 (BP Location: Left Arm)   Pulse 69   Temp 98.3 F (36.8 C) (Oral)   Resp 18   Ht 5\' 6"  (1.676 m)   Wt 129 lb (58.5 kg)   SpO2 95%   BMI 20.82 kg/m   Physical Exam  Constitutional: She is oriented to person, place, and time. She appears well-developed and well-nourished.  HENT:  Head: Normocephalic and  atraumatic.  Obvious epistaxis from the right nares  Eyes: Conjunctivae are normal.  Neck: Neck supple.  Cardiovascular: Normal rate and regular rhythm.   Pulmonary/Chest: Effort normal and breath sounds normal.  Abdominal: Soft. Bowel sounds are normal.  Musculoskeletal: Normal range of motion.  Neurological: She is alert and oriented to person, place, and time.  Skin: Skin is warm and dry.  Psychiatric: She has a normal mood and affect. Her behavior is normal.  Nursing note and vitals reviewed.    ED Treatments / Results  Labs (all labs ordered are listed, but only abnormal results are displayed) Labs Reviewed  CBC WITH DIFFERENTIAL/PLATELET  BASIC METABOLIC PANEL    EKG  EKG Interpretation None       Radiology No results found.  Procedures .Epistaxis Management Date/Time: 11/23/2016 5:45 PM Performed by: Nat Christen Authorized by: Nat Christen   Consent:    Consent obtained:  Verbal   Consent given by:  Patient   Risks discussed:  Bleeding and nasal injury Procedure details:    Treatment site:  R anterior   Treatment method:  Merocel sponge   Treatment complexity:  Extensive   Treatment episode: recurring   Post-procedure details:    Assessment:  Bleeding decreased   Patient tolerance of procedure:  Tolerated with difficulty Comments:     Patient continues to bleed despite a Merocel packing   (including critical care time)  Medications Ordered in ED Medications  oxymetazoline (AFRIN) 0.05 % nasal spray 1 spray (1 spray Each Nare Given 11/23/16 1655)  sodium chloride 0.9 % bolus 1,000 mL (1,000 mLs Intravenous New Bag/Given 11/23/16 1822)  ondansetron (ZOFRAN) injection 4 mg (4 mg Intravenous Given 11/23/16 1823)  pantoprazole (PROTONIX) injection 40 mg (40 mg Intravenous Given 11/23/16 1823)     Initial Impression / Assessment and Plan / ED Course  I have reviewed the triage vital signs and the nursing notes.  Pertinent labs & imaging results that were  available during my care of the patient were reviewed by me and considered in my medical decision making (see chart for details).    CRITICAL CARE Performed by: Nat Christen Total critical care time: 30 minutes Critical care time was exclusive of separately billable procedures and treating other patients. Critical care was necessary to treat or prevent imminent or life-threatening deterioration. Critical care was time spent personally by me on the following activities: development of treatment plan with patient and/or surrogate as well as nursing, discussions with consultants, evaluation of patient's response to treatment, examination of patient, obtaining history from patient or surrogate, ordering and performing treatments and interventions, ordering and review of laboratory studies, ordering and review of radiographic studies, pulse oximetry and re-evaluation of patient's condition.  Patient continues to bleed despite a right-sided Merocel packing.  Will consult ENT in [facility] for transfer  Final Clinical Impressions(s) / ED Diagnoses   Final diagnoses:  Right-sided epistaxis    New Prescriptions New Prescriptions   No medications on file     Nat Christen, MD 11/23/16 [place], MD 11/23/16 3474

## 2016-11-23 NOTE — ED Provider Notes (Signed)
8:51 PM Patient arrived in transfer from [facility]. Dr. Constance Holster requesting transfer for management of epistaxis. Merocel packing in place; soaked in blood. No copious bleeding from right nare, though there is scant bleeding from the left nare which is suspected to be from overflow. Patient denies breathing or swallowing difficulty. No c/o pain. Will consult ENT to notify Dr. Constance Holster of patient's arrival. ENT cart ordered to bedside.  9:05 PM Dr. Constance Holster aware of patient arrival.  9:21 PM Dr. Constance Holster at bedside.  10:04 PM Dr. Constance Holster has cauterized the site of anterior epistaxis and packed the right nare. He plans to admit the patient for overnight observation to more closely monitor given history of repeat bleeding. VSS.   Results for orders placed or performed during the hospital encounter of 11/23/16  CBC with Differential  Result Value Ref Range   WBC 4.5 4.0 - 10.5 K/uL   RBC 4.09 3.87 - 5.11 MIL/uL   Hemoglobin 12.3 12.0 - 15.0 g/dL   HCT 35.8 (L) 36.0 - 46.0 %   MCV 87.5 78.0 - 100.0 fL   MCH 30.1 26.0 - 34.0 pg   MCHC 34.4 30.0 - 36.0 g/dL   RDW 13.3 11.5 - 15.5 %   Platelets 153 150 - 400 K/uL   Neutrophils Relative % 72 %   Neutro Abs 3.3 1.7 - 7.7 K/uL   Lymphocytes Relative 19 %   Lymphs Abs 0.8 0.7 - 4.0 K/uL   Monocytes Relative 9 %   Monocytes Absolute 0.4 0.1 - 1.0 K/uL   Eosinophils Relative 0 %   Eosinophils Absolute 0.0 0.0 - 0.7 K/uL   Basophils Relative 0 %   Basophils Absolute 0.0 0.0 - 0.1 K/uL  Basic metabolic panel  Result Value Ref Range   Sodium 135 135 - 145 mmol/L   Potassium 3.7 3.5 - 5.1 mmol/L   Chloride 96 (L) 101 - 111 mmol/L   CO2 30 22 - 32 mmol/L   Glucose, Bld 122 (H) 65 - 99 mg/dL   BUN 25 (H) 6 - 20 mg/dL   Creatinine, Ser 1.36 (H) 0.44 - 1.00 mg/dL   Calcium 9.4 8.9 - 10.3 mg/dL   GFR calc non Af Amer 36 (L) >60 mL/min   GFR calc Af Amer 41 (L) >60 mL/min   Anion gap 9 5 - [address] PA-C 11/23/16 2206    Julianne Rice, MD 11/25/16 [phone]

## 2016-11-23 NOTE — ED Notes (Signed)
ENT/Rosen made aware that patient has arrived

## 2016-11-23 NOTE — ED Notes (Signed)
ENT MD at bedside.

## 2016-11-23 NOTE — ED Triage Notes (Signed)
Patient has nose bleed that started this morning at 6am which she packed her nose and the bleeding slowed down. Per patient both sides started bleeding at 3pm and is now draining in back of throat. Patient seen by ENT on Friday and had right side cauterized. Patient also seen in here yesterday for nose bleed. Patient reports using Afrin spray once this morning but none since.

## 2016-11-24 MED ORDER — AMOXICILLIN 250 MG PO CAPS: 250.0000 mg | ORAL_CAPSULE | Freq: Three times a day (TID) | ORAL | 0 refills | Status: AC

## 2016-11-24 NOTE — Discharge Summary (Signed)
Physician Discharge Summary  Patient ID: [name] MRN: [id number] DOB/AGE: 81-Aug-1935 81 y.o.  Admit date: 11/23/2016 Discharge date: 11/24/2016  Admission Diagnoses:epistaxis  Discharge Diagnoses:  Active Problems:   Severe epistaxis   Discharged Condition: good  Hospital Course: no further bleeding.  Consults: none  Significant Diagnostic Studies: none  Treatments: nasal cautery and packing  Discharge Exam: Blood pressure (!) 121/36, pulse 60, temperature 98.6 F (37 C), temperature source Oral, resp. rate 18, height 5\' 6"  (1.676 m), weight 58.5 kg (129 lb), SpO2 92 %. PHYSICAL EXAM: Packing in place. No bleeding, slight pink drainage.  Disposition: 01-Home or Self Care  Discharge Instructions    Diet - low sodium heart healthy    Complete by:  As directed    Increase activity slowly    Complete by:  As directed      Allergies as of 11/24/2016      Reactions   Amlodipine Other (See Comments)   edema   Isosorbide Nitrate Nausea Only   Sulfonamide Derivatives Nausea Only, Rash   Terazosin Hives, Nausea Only      Medication List    TAKE these medications   amoxicillin 250 MG capsule Commonly known as:  AMOXIL Take 1 capsule (250 mg total) by mouth 3 (three) times daily.   aspirin EC 81 MG tablet Take 81 mg by mouth See admin instructions. Take 1 tablet (81 mg) by mouth every other night   atenolol-chlorthalidone 50-25 MG tablet Commonly known as:  TENORETIC TAKE 1 TABLET BY MOUTH DAILY.   atorvastatin 20 MG tablet Commonly known as:  LIPITOR Take 10 mg by mouth at bedtime.   calcium-vitamin D 500-200 MG-UNIT tablet Commonly known as:  OSCAL WITH D Take 1 tablet by mouth at bedtime.   cholecalciferol 1000 units tablet Commonly known as:  VITAMIN D Take 1,000 Units by mouth at bedtime.   cloNIDine 0.1 MG tablet Commonly known as:  CATAPRES Take 0.1 mg by mouth See admin instructions. Take 1 tablet (0.1 mg) by mouth daily at bedtime - may also  take 1 tablet (0.1 mg) during the day as needed for SBP >160   CVS IRON 325 (65 FE) MG tablet Generic drug:  ferrous sulfate Take 325 mg by mouth 2 (two) times daily with a meal.   Fish Oil 1200 MG Caps Take 1,200 mg by mouth daily.   flecainide 50 MG tablet Commonly known as:  TAMBOCOR Take 1 tablet (50 mg total) by mouth 2 (two) times daily.   fluticasone 50 MCG/ACT nasal spray Commonly known as:  FLONASE Place 1 spray into both nostrils 2 (two) times daily as needed for allergies or rhinitis.   hydrALAZINE 50 MG tablet Commonly known as:  APRESOLINE Take 50 mg by mouth 2 (two) times daily.   hydroxypropyl methylcellulose / hypromellose 2.5 % ophthalmic solution Commonly known as:  ISOPTO TEARS / GONIOVISC Place 1 drop into both eyes daily.   levothyroxine 75 MCG tablet Commonly known as:  SYNTHROID, LEVOTHROID TAKE 1 TABLET (75 MCG TOTAL) BY MOUTH DAILY. What changed:  See the new instructions.   lisinopril 40 MG tablet Commonly known as:  PRINIVIL,ZESTRIL TAKE 1 TABLET BY MOUTH TWICE A DAY What changed:  See the new instructions.   polyethylene glycol powder powder Commonly known as:  GLYCOLAX/MIRALAX Take 17 g by mouth at bedtime. Mix in 4-8 oz water and drink   sodium chloride 0.65 % Soln nasal spray Commonly known as:  OCEAN Place 1 spray into  both nostrils 2 (two) times daily.      Follow-up Information    Izora Gala, MD. Schedule an appointment as soon as possible for a visit on 11/28/2016.   Specialty:  Otolaryngology Contact information: [address] [place] [address] [phone]           Signed: Izora Gala 11/24/2016, 8:45 AM

## 2016-11-24 NOTE — Progress Notes (Signed)
On admission,patient declined to remove clothing for full skin assessment. She stated that she was "cold". She denied having any wounds, rashes, skin ulcers or skin issues other than the ecchymotic areas on her hands arms and legs.

## 2016-11-24 NOTE — Progress Notes (Signed)
Patient discharged to home with instructions and prescription. 

## 2016-11-24 NOTE — Discharge Instructions (Signed)
OK to gently blow the left side of the nose.  No lifting more than 10 pounds.  Avoid bending over.  Call if any bleeding.

## 2016-11-27 ENCOUNTER — Ambulatory Visit (INDEPENDENT_AMBULATORY_CARE_PROVIDER_SITE_OTHER): Payer: PPO | Admitting: Nurse Practitioner

## 2016-11-27 ENCOUNTER — Encounter: Payer: Self-pay | Admitting: Nurse Practitioner

## 2016-11-27 ENCOUNTER — Telehealth: Payer: Self-pay | Admitting: Family Medicine

## 2016-11-27 DIAGNOSIS — T148XXA Other injury of unspecified body region, initial encounter: Secondary | ICD-10-CM | POA: Diagnosis not present

## 2016-11-27 NOTE — Progress Notes (Signed)
   Subjective:    Patient ID: [name], female    DOB: [birth year], 83 y.o.   MRN: [id number]  HPI Patient comes in today wanting left hand looked at. She went to ER on 11/23/16 with epistaxis and had an IV in hr right hand- has a large hematoma on it and it is sore to touch.    Review of Systems  Constitutional: Negative.   HENT: Negative.   Respiratory: Negative.   Gastrointestinal: Negative.   Skin:       Hematoma right hand  Neurological: Negative.   Psychiatric/Behavioral: Negative.   All other systems reviewed and are negative.      Objective:   Physical Exam  Constitutional: She is oriented to person, place, and time. She appears well-developed and well-nourished.  Cardiovascular: Normal rate and regular rhythm.   Pulmonary/Chest: Effort normal and breath sounds normal.  Musculoskeletal: Normal range of motion.  FROM of left hand  Neurological: She is oriented to person, place, and time.  Skin:  Hematoma of left hand with surrounding bruising of multiple stages and localized swelling.   Psychiatric: She has a normal mood and affect. Her behavior is normal. Judgment and thought content normal.    Temp 98.6 F (37 C) (Oral)   Ht 5\' 6"  (1.676 m)   Wt 129 lb (58.5 kg)   BMI 20.82 kg/m      Assessment & Plan:   1. Hematoma    Apply ice/heat to the affected area in intermittent cycles.  Avoid additional trauma to the affected area. Return PRN or for worsening symptoms.   Mary-Margaret Hassell Done, FNP

## 2016-11-27 NOTE — Telephone Encounter (Signed)
Pt called -- appt made for today

## 2016-11-27 NOTE — Telephone Encounter (Signed)
Spoke with patient. She is wanting to talk to Roselyn Reef about her going to the hospital this weekend and also wanting to know if she can come to the back door and Roselyn Reef look at her hematoma on her hand. Offered an appointment with MMM for this afternoon and patient states she just wants someone to look at it at the back door. Please call pt.

## 2016-11-27 NOTE — Patient Instructions (Signed)
Hematoma A hematoma is a collection of blood. The collection of blood can turn into a hard, painful lump under the skin. Your skin may turn blue or yellow if the hematoma is close to the surface of the skin. Most hematomas get better in a few days to weeks. Some hematomas are serious and need medical care. Hematomas can be very small or very big. Follow these instructions at home:  Apply ice to the injured area: ? Put ice in a plastic bag. ? Place a towel between your skin and the bag. ? Leave the ice on for 20 minutes, 2-3 times a day for the first 1 to 2 days.  After the first 2 days, switch to using warm packs on the injured area.  Raise (elevate) the injured area to lessen pain and puffiness (swelling). You may also wrap the area with an elastic bandage. Make sure the bandage is not wrapped too tight.  If you have a painful hematoma on your leg or foot, you may use crutches for a couple days.  Only take medicines as told by your doctor. Get help right away if:  Your pain gets worse.  Your pain is not controlled with medicine.  You have a fever.  Your puffiness gets worse.  Your skin turns more blue or yellow.  Your skin over the hematoma breaks or starts bleeding.  Your hematoma is in your chest or belly (abdomen) and you are short of breath, feel weak, or have a change in consciousness.  Your hematoma is on your scalp and you have a headache that gets worse or a change in alertness or consciousness. This information is not intended to replace advice given to you by your health care provider. Make sure you discuss any questions you have with your health care provider. Document Released: 09/11/2004 Document Revised: 01/10/2016 Document Reviewed: 01/12/2013 Elsevier Interactive Patient Education  2017 [employer]  

## 2016-12-01 ENCOUNTER — Telehealth: Payer: Self-pay | Admitting: Family Medicine

## 2016-12-01 NOTE — Telephone Encounter (Signed)
Returned patients phone call.  Patient states that she was seen on Friday 11/28/16 for hematoma on hand.  Patient has been applying ice to hematoma every 15 mins.  Patient states that she now has a throbbing pain and would like to be seen.  appt made for 12/02/16 at 9am with mmm

## 2016-12-02 ENCOUNTER — Ambulatory Visit (INDEPENDENT_AMBULATORY_CARE_PROVIDER_SITE_OTHER): Payer: PPO | Admitting: Family Medicine

## 2016-12-02 ENCOUNTER — Ambulatory Visit: Payer: PPO | Admitting: Nurse Practitioner

## 2016-12-02 ENCOUNTER — Encounter: Payer: Self-pay | Admitting: Family Medicine

## 2016-12-02 VITALS — BP 199/95 | HR 70 | Temp 98.9°F | Ht 66.0 in | Wt 130.0 lb

## 2016-12-02 DIAGNOSIS — I809 Phlebitis and thrombophlebitis of unspecified site: Principal | ICD-10-CM

## 2016-12-02 DIAGNOSIS — T801XXA Vascular complications following infusion, transfusion and therapeutic injection, initial encounter: Secondary | ICD-10-CM | POA: Diagnosis not present

## 2016-12-02 DIAGNOSIS — M79642 Pain in left hand: Secondary | ICD-10-CM | POA: Diagnosis not present

## 2016-12-02 MED ORDER — CEPHALEXIN 500 MG PO CAPS: 500.0000 mg | ORAL_CAPSULE | Freq: Three times a day (TID) | ORAL | 0 refills | Status: DC

## 2016-12-02 NOTE — Progress Notes (Signed)
Subjective:  Patient ID: [name], female    DOB: 1933/08/31  Age: 83 y.o. MRN: [id number]  CC: Hematoma on left hand (happened a week ago while IV was being removed from hand at hospital, has been seen here, has improved but now is having throbbing pain and hand feels hot)   HPI [name] presents for Continued pain. Seems to be getting worse. More heat also noted in the hand. Remains localized to the dorsum of the left hand. Patient says the swelling has actually decreased but the pain has increased.  History [name] has a past medical history of Anemia; Arthritis; Asthma; CKD (chronic kidney disease), stage III; Hyperlipidemia; Hypertension; Hypothyroidism; Nodule of right lung; Renal insufficiency; Renal vascular disease; Right renal artery stenosis (HCC) (05/09/2015); and Vertigo.   She has a past surgical history that includes Thyroidectomy, partial (Right, 1981); ir generic historical (08/29/2016); and ir generic historical (08/29/2016).   Her family history includes Cancer in her maternal grandmother; Hypercholesterolemia in her daughter and daughter; Hypertension in her brother and daughter; Lung cancer in her maternal uncle and maternal uncle; Stroke in her father; Thyroid disease in her mother.She reports that she has never smoked. She has never used smokeless tobacco. She reports that she does not drink alcohol or use drugs.  Current Outpatient Prescriptions on File Prior to Visit  Medication Sig Dispense Refill  . aspirin EC 81 MG tablet Take 81 mg by mouth See admin instructions. Take 1 tablet (81 mg) by mouth every other night    . atenolol-chlorthalidone (TENORETIC) 50-25 MG tablet TAKE 1 TABLET BY MOUTH DAILY. 90 tablet 1  . atorvastatin (LIPITOR) 20 MG tablet Take 10 mg by mouth at bedtime.     . calcium-vitamin D (OSCAL WITH D) 500-200 MG-UNIT per tablet Take 1 tablet by mouth at bedtime.     . cholecalciferol (VITAMIN D) 1000 units tablet Take 1,000 Units by mouth at  bedtime.     . cloNIDine (CATAPRES) 0.1 MG tablet Take 0.1 mg by mouth See admin instructions. Take 1 tablet (0.1 mg) by mouth daily at bedtime - may also take 1 tablet (0.1 mg) during the day as needed for SBP >160  6  . ferrous sulfate ([employer] IRON) 325 (65 FE) MG tablet Take 325 mg by mouth 2 (two) times daily with a meal.    . flecainide (TAMBOCOR) 50 MG tablet Take 1 tablet (50 mg total) by mouth 2 (two) times daily. 180 tablet 3  . fluticasone (FLONASE) 50 MCG/ACT nasal spray Place 1 spray into both nostrils 2 (two) times daily as needed for allergies or rhinitis.     . hydrALAZINE (APRESOLINE) 50 MG tablet Take 50 mg by mouth 2 (two) times daily.     . hydroxypropyl methylcellulose / hypromellose (ISOPTO TEARS / GONIOVISC) 2.5 % ophthalmic solution Place 1 drop into both eyes daily.     Marland Kitchen levothyroxine (SYNTHROID, LEVOTHROID) 75 MCG tablet TAKE 1 TABLET (75 MCG TOTAL) BY MOUTH DAILY. (Patient taking differently: TAKE 1 TABLET (75 MCG TOTAL) BY MOUTH DAILY BEFORE BREAKFAST) 90 tablet 0  . lisinopril (PRINIVIL,ZESTRIL) 40 MG tablet TAKE 1 TABLET BY MOUTH TWICE A DAY (Patient taking differently: TAKE 1 TABLET BY MOUTH DAILY) 60 tablet 2  . Omega-3 Fatty Acids (FISH OIL) 1200 MG CAPS Take 1,200 mg by mouth daily.    . polyethylene glycol powder (GLYCOLAX/MIRALAX) powder Take 17 g by mouth at bedtime. Mix in 4-8 oz water and drink    .  sodium chloride (OCEAN) 0.65 % SOLN nasal spray Place 1 spray into both nostrils 2 (two) times daily.      No current facility-administered medications on file prior to visit.     ROS Review of Systems   noncontributory  Objective:  BP (!) 199/95   Pulse 70   Temp 98.9 F (37.2 C) (Oral)   Ht 5\' 6"  (1.676 m)   Wt 130 lb (59 kg)   BMI 20.98 kg/m   Physical Exam  Constitutional: She is oriented to person, place, and time. She appears well-developed and well-nourished.  Cardiovascular: Normal rate and regular rhythm.   Pulmonary/Chest: Effort normal and  breath sounds normal.  Musculoskeletal: Normal range of motion.  FROM of left hand  Neurological: She is oriented to person, place, and time.  Skin:  Hematoma of left hand with surrounding bruising of multiple stages and localized swelling.   Psychiatric: She has a normal mood and affect. Her behavior is normal. Judgment and thought content normal.    Assessment & Plan:   [name] was seen today for hematoma on left hand.  Diagnoses and all orders for this visit:  Phlebitis after infusion, initial encounter  Other orders -     cephALEXin (KEFLEX) 500 MG capsule; Take 1 capsule (500 mg total) by mouth 3 (three) times daily.   I am having [name] start on cephALEXin. I am also having her maintain her calcium-vitamin D, Fish Oil, aspirin EC, cloNIDine, ferrous sulfate, hydrALAZINE, hydroxypropyl methylcellulose / hypromellose, atorvastatin, flecainide, atenolol-chlorthalidone, polyethylene glycol powder, fluticasone, lisinopril, levothyroxine, cholecalciferol, and sodium chloride.  Meds ordered this encounter  Medications  . cephALEXin (KEFLEX) 500 MG capsule    Sig: Take 1 capsule (500 mg total) by mouth 3 (three) times daily.    Dispense:  30 capsule    Refill:  0     Follow-up: Return if symptoms worsen or fail to improve.  Claretta Fraise, M.D.

## 2016-12-05 ENCOUNTER — Other Ambulatory Visit: Payer: Self-pay | Admitting: Family Medicine

## 2016-12-16 ENCOUNTER — Ambulatory Visit (INDEPENDENT_AMBULATORY_CARE_PROVIDER_SITE_OTHER): Payer: PPO | Admitting: Family Medicine

## 2016-12-16 ENCOUNTER — Other Ambulatory Visit: Payer: PPO

## 2016-12-16 DIAGNOSIS — I10 Essential (primary) hypertension: Secondary | ICD-10-CM

## 2016-12-16 DIAGNOSIS — E7800 Pure hypercholesterolemia, unspecified: Secondary | ICD-10-CM | POA: Diagnosis not present

## 2016-12-16 DIAGNOSIS — N183 Chronic kidney disease, stage 3 unspecified: Secondary | ICD-10-CM

## 2016-12-16 DIAGNOSIS — E559 Vitamin D deficiency, unspecified: Secondary | ICD-10-CM

## 2016-12-16 DIAGNOSIS — I480 Paroxysmal atrial fibrillation: Secondary | ICD-10-CM | POA: Diagnosis not present

## 2016-12-16 DIAGNOSIS — D696 Thrombocytopenia, unspecified: Secondary | ICD-10-CM

## 2016-12-17 ENCOUNTER — Telehealth: Payer: Self-pay | Admitting: Family Medicine

## 2016-12-17 LAB — CBC WITH DIFFERENTIAL/PLATELET
BASOS: 0 %
Basophils Absolute: 0 10*3/uL (ref 0.0–0.2)
EOS (ABSOLUTE): 0 10*3/uL (ref 0.0–0.4)
Eos: 1 %
Hematocrit: 33.2 % — ABNORMAL LOW (ref 34.0–46.6)
Hemoglobin: 10.8 g/dL — ABNORMAL LOW (ref 11.1–15.9)
IMMATURE GRANULOCYTES: 0 %
Immature Grans (Abs): 0 10*3/uL (ref 0.0–0.1)
LYMPHS ABS: 0.9 10*3/uL (ref 0.7–3.1)
Lymphs: 39 %
MCH: 29 pg (ref 26.6–33.0)
MCHC: 32.5 g/dL (ref 31.5–35.7)
MCV: 89 fL (ref 79–97)
MONOS ABS: 0.2 10*3/uL (ref 0.1–0.9)
Monocytes: 8 %
NEUTROS ABS: 1.2 10*3/uL — AB (ref 1.4–7.0)
Neutrophils: 52 %
PLATELETS: 116 10*3/uL — AB (ref 150–379)
RBC: 3.73 x10E6/uL — ABNORMAL LOW (ref 3.77–5.28)
RDW: 13.5 % (ref 12.3–15.4)
WBC: 2.3 10*3/uL — CL (ref 3.4–10.8)

## 2016-12-17 LAB — LIPID PANEL
CHOLESTEROL TOTAL: 141 mg/dL (ref 100–199)
Chol/HDL Ratio: 2.7 ratio (ref 0.0–4.4)
HDL: 53 mg/dL (ref >39)
LDL CALC: 67 mg/dL (ref 0–99)
TRIGLYCERIDES: 106 mg/dL (ref 0–149)
VLDL CHOLESTEROL CAL: 21 mg/dL (ref 5–40)

## 2016-12-17 LAB — BMP8+EGFR
BUN / CREAT RATIO: 15 (ref 12–28)
BUN: 19 mg/dL (ref 8–27)
CO2: 31 mmol/L — ABNORMAL HIGH (ref 18–29)
Calcium: 9.4 mg/dL (ref 8.7–10.3)
Chloride: 98 mmol/L (ref 96–106)
Creatinine, Ser: 1.27 mg/dL — ABNORMAL HIGH (ref 0.57–1.00)
GFR, EST AFRICAN AMERICAN: 46 mL/min/{1.73_m2} — AB (ref >59)
GFR, EST NON AFRICAN AMERICAN: 40 mL/min/{1.73_m2} — AB (ref >59)
Glucose: 107 mg/dL — ABNORMAL HIGH (ref 65–99)
POTASSIUM: 4 mmol/L (ref 3.5–5.2)
SODIUM: 141 mmol/L (ref 134–144)

## 2016-12-17 LAB — HEPATIC FUNCTION PANEL
ALT: 14 IU/L (ref 0–32)
AST: 20 IU/L (ref 0–40)
Albumin: 4.2 g/dL (ref 3.5–4.7)
Alkaline Phosphatase: 63 IU/L (ref 39–117)
BILIRUBIN, DIRECT: 0.14 mg/dL (ref 0.00–0.40)
Bilirubin Total: 0.4 mg/dL (ref 0.0–1.2)
TOTAL PROTEIN: 6.5 g/dL (ref 6.0–8.5)

## 2016-12-17 LAB — VITAMIN D 25 HYDROXY (VIT D DEFICIENCY, FRACTURES): Vit D, 25-Hydroxy: 55.6 ng/mL (ref 30.0–100.0)

## 2016-12-17 NOTE — Telephone Encounter (Signed)
Aware of lab results  

## 2016-12-19 ENCOUNTER — Ambulatory Visit: Payer: PPO | Admitting: Neurology

## 2016-12-22 ENCOUNTER — Ambulatory Visit: Payer: Medicare Other | Admitting: Family Medicine

## 2016-12-25 ENCOUNTER — Encounter: Payer: Self-pay | Admitting: Family Medicine

## 2016-12-25 ENCOUNTER — Ambulatory Visit (INDEPENDENT_AMBULATORY_CARE_PROVIDER_SITE_OTHER): Payer: PPO | Admitting: Family Medicine

## 2016-12-25 ENCOUNTER — Ambulatory Visit (INDEPENDENT_AMBULATORY_CARE_PROVIDER_SITE_OTHER): Payer: PPO

## 2016-12-25 VITALS — BP 143/54 | HR 58 | Temp 97.4°F | Ht 66.0 in | Wt 133.0 lb

## 2016-12-25 DIAGNOSIS — N183 Chronic kidney disease, stage 3 unspecified: Secondary | ICD-10-CM

## 2016-12-25 DIAGNOSIS — I480 Paroxysmal atrial fibrillation: Secondary | ICD-10-CM

## 2016-12-25 DIAGNOSIS — R040 Epistaxis: Secondary | ICD-10-CM | POA: Diagnosis not present

## 2016-12-25 DIAGNOSIS — I10 Essential (primary) hypertension: Secondary | ICD-10-CM

## 2016-12-25 DIAGNOSIS — Z13820 Encounter for screening for osteoporosis: Secondary | ICD-10-CM

## 2016-12-25 DIAGNOSIS — Z780 Asymptomatic menopausal state: Secondary | ICD-10-CM

## 2016-12-25 DIAGNOSIS — T148XXA Other injury of unspecified body region, initial encounter: Secondary | ICD-10-CM

## 2016-12-25 DIAGNOSIS — E7800 Pure hypercholesterolemia, unspecified: Secondary | ICD-10-CM

## 2016-12-25 DIAGNOSIS — S60222D Contusion of left hand, subsequent encounter: Secondary | ICD-10-CM | POA: Diagnosis not present

## 2016-12-25 DIAGNOSIS — E559 Vitamin D deficiency, unspecified: Secondary | ICD-10-CM

## 2016-12-25 NOTE — Patient Instructions (Addendum)
Medicare Annual Wellness Visit  [name] and the medical providers at [facility] strive to bring you the best medical care.  In doing so we not only want to address your current medical conditions and concerns but also to detect new conditions early and prevent illness, disease and health-related problems.    Medicare offers a yearly Wellness Visit which allows our clinical staff to assess your need for preventative services including immunizations, lifestyle education, counseling to decrease risk of preventable diseases and screening for fall risk and other medical concerns.    This visit is provided free of charge (no copay) for all Medicare recipients. The clinical pharmacists at [facility] have begun to conduct these Wellness Visits which will also include a thorough review of all your medications.    As you primary medical provider recommend that you make an appointment for your Annual Wellness Visit if you have not done so already this year.  You may set up this appointment before you leave today or you may call back ([phone]) and schedule an appointment.  Please make sure when you call that you mention that you are scheduling your Annual Wellness Visit with the clinical pharmacist so that the appointment may be made for the proper length of time.     Continue current medications. Continue good therapeutic lifestyle changes which include good diet and exercise. Fall precautions discussed with patient. If an FOBT was given today- please return it to our front desk. If you are over 81 years old - you may need Prevnar 69 or the adult Pneumonia vaccine.  **Flu shots are available--- please call and schedule a FLU-CLINIC appointment**  After your visit with [place] today you will receive a survey in the mail or online from [employer] regarding your care with [place]. Please take a moment to fill this out. Your feedback is very  important to [place] as you can help [place] better understand your patient needs as well as improve your experience and satisfaction. WE CARE ABOUT YOU!!!   We will call with results of the DEXA scan as soon as those results become available Follow-up with nephrologist as planned

## 2016-12-25 NOTE — Progress Notes (Signed)
Subjective:    Patient ID: [name], female    DOB: 09-24-1933, 83 y.o.   MRN: [id number]  HPI Pt here for follow up and management of chronic medical problems which includes hyperlipidemia and hypertension. She is taking medication regularly.The patient was recently seen in the office because of a hematoma of the left hand following an IV infusion in the hospital. She went to the hospital because she had persistent right nose bleeds. She was followed by Dr. Constance Holster. Today she denies any specific symptoms. She is due to get a DEXA scan review her lab work and return an FOBT. All cholesterol numbers were good with an LDL C being 67. The blood sugar slightly increased at 107. The creatinine was 1.27 and this is lower than it has been over the past several months. The electrolytes including potassium are good. Her CO2 is slightly increased. The CBC had a white blood cell count that was decreased. This is been the case in the past and we will continue to monitor this. Her hemoglobin is 10.8 and decreased from previously. Platelet count was also decreased at 116,000. This is been low in the past. All liver function tests were within normal limits. The vitamin D level was good at 55.6. The patient sees the nephrologist regularly.    Patient Active Problem List   Diagnosis Date Noted  . Hematoma 11/27/2016  . Severe epistaxis 11/23/2016  . Vertigo 09/09/2016  . Renal vascular disease 10/31/2015  . Nodule of right lung 10/31/2015  . Asthma without status asthmaticus 10/31/2015  . Essential hypertension, malignant   . Hypothyroidism 10/15/2015  . Pancytopenia ([place]) 10/15/2015  . Hyponatremia 10/14/2015  . Thrombocytopenia ([place]) 06/16/2015  . Right renal artery stenosis ([place]) 05/09/2015  . Chronic diastolic heart failure ([place]) 04/03/2014  . Acute diastolic heart failure ([place]) 04/03/2014  . Hypertensive urgency 04/02/2014  . Headache 04/02/2014  . CKD (chronic kidney disease) stage 3, GFR 30-59  ml/min 04/02/2014  . Paroxysmal atrial fibrillation ([place]) 01/25/2014  . Hypokalemia 07/19/2013  . Anemia 07/19/2013  . Bilateral lower extremity edema 06/30/2013  . Hypertension with fluid overload 06/30/2013  . Need for prophylactic vaccination and inoculation against influenza 05/31/2013  . Pedal edema 05/10/2013  . Varicose veins of lower extremities with other complications 44/31/5400  . ABNORMAL STRESS ELECTROCARDIOGRAM 01/23/2010  . Hyperlipidemia 12/18/2009  . Essential hypertension 12/18/2009  . RENAL INSUFFICIENCY, CHRONIC 12/18/2009  . ARTHRITIS 12/18/2009   Outpatient Encounter Prescriptions as of 12/25/2016  Medication Sig  . aspirin EC 81 MG tablet Take 81 mg by mouth See admin instructions. Take 1 tablet (81 mg) by mouth every other night  . atenolol-chlorthalidone (TENORETIC) 50-25 MG tablet TAKE 1 TABLET BY MOUTH DAILY.  Marland Kitchen atorvastatin (LIPITOR) 20 MG tablet TAKE 1/2 TABLET DAILY  . calcium-vitamin D (OSCAL WITH D) 500-200 MG-UNIT per tablet Take 1 tablet by mouth at bedtime.   . cholecalciferol (VITAMIN D) 1000 units tablet Take 1,000 Units by mouth at bedtime.   . cloNIDine (CATAPRES) 0.1 MG tablet Take 0.1 mg by mouth See admin instructions. Take 1 tablet (0.1 mg) by mouth daily at bedtime - may also take 1 tablet (0.1 mg) during the day as needed for SBP >160  . ferrous sulfate ([employer]S IRON) 325 (65 FE) MG tablet Take 325 mg by mouth 2 (two) times daily with a meal.  . flecainide (TAMBOCOR) 50 MG tablet Take 1 tablet (50 mg total) by mouth 2 (two) times daily.  . fluticasone (  FLONASE) 50 MCG/ACT nasal spray Place 1 spray into both nostrils 2 (two) times daily as needed for allergies or rhinitis.   . hydrALAZINE (APRESOLINE) 50 MG tablet Take 50 mg by mouth 2 (two) times daily.   . hydroxypropyl methylcellulose / hypromellose (ISOPTO TEARS / GONIOVISC) 2.5 % ophthalmic solution Place 1 drop into both eyes daily.   Marland Kitchen levothyroxine (SYNTHROID, LEVOTHROID) 75 MCG tablet TAKE 1  TABLET (75 MCG TOTAL) BY MOUTH DAILY. (Patient taking differently: TAKE 1 TABLET (75 MCG TOTAL) BY MOUTH DAILY BEFORE BREAKFAST)  . lisinopril (PRINIVIL,ZESTRIL) 40 MG tablet TAKE 1 TABLET BY MOUTH TWICE A DAY (Patient taking differently: TAKE 1 TABLET BY MOUTH DAILY)  . Omega-3 Fatty Acids (FISH OIL) 1200 MG CAPS Take 1,200 mg by mouth daily.  . sodium chloride (OCEAN) 0.65 % SOLN nasal spray Place 1 spray into both nostrils 2 (two) times daily.   . [DISCONTINUED] atorvastatin (LIPITOR) 20 MG tablet Take 10 mg by mouth at bedtime.   . [DISCONTINUED] cephALEXin (KEFLEX) 500 MG capsule Take 1 capsule (500 mg total) by mouth 3 (three) times daily.  . [DISCONTINUED] polyethylene glycol powder (GLYCOLAX/MIRALAX) powder Take 17 g by mouth at bedtime. Mix in 4-8 oz water and drink   No facility-administered encounter medications on file as of 12/25/2016.       Review of Systems  Constitutional: Negative.   HENT: Negative.   Eyes: Negative.   Respiratory: Negative.   Cardiovascular: Negative.   Gastrointestinal: Negative.   Endocrine: Negative.   Genitourinary: Negative.   Musculoskeletal: Negative.   Skin: Negative.   Allergic/Immunologic: Negative.   Neurological: Negative.   Hematological: Negative.   Psychiatric/Behavioral: Negative.        Objective:   Physical Exam  Constitutional: She is oriented to person, place, and time. She appears well-developed and well-nourished. No distress.  The patient is doing well overall and good spirits  HENT:  Head: Normocephalic and atraumatic.  Right Ear: External ear normal.  Left Ear: External ear normal.  Nose: Nose normal.  Mouth/Throat: Oropharynx is clear and moist. No oropharyngeal exudate.  No sign of any nasal septal irritation  Eyes: Conjunctivae and EOM are normal. Pupils are equal, round, and reactive to light. Right eye exhibits no discharge. Left eye exhibits no discharge. No scleral icterus.  Neck: Normal range of motion. Neck  supple. No thyromegaly present.  No  thyromegaly or anterior cervical adenopathy. Are bilateral bruits which are bleeding coming from a systolic ejection murmur in the heart.  Cardiovascular: Normal rate, regular rhythm, normal heart sounds and intact distal pulses.  Exam reveals no friction rub.   No murmur heard. The heart is regular at 60/m  Pulmonary/Chest: Effort normal and breath sounds normal. No respiratory distress. She has no wheezes. She has no rales.  Clear anteriorly and posteriorly  Abdominal: Soft. Bowel sounds are normal. She exhibits no mass. There is no tenderness. There is no rebound and no guarding.  No abdominal tenderness masses bruits or organ enlargement.  Musculoskeletal: Normal range of motion. She exhibits no edema.  Lymphadenopathy:    She has no cervical adenopathy.  Neurological: She is alert and oriented to person, place, and time. She has normal reflexes. No cranial nerve deficit.  Skin: Skin is warm and dry. No rash noted.  Psychiatric: She has a normal mood and affect. Her behavior is normal. Judgment and thought content normal.  Nursing note and vitals reviewed.  BP (!) 143/54 (BP Location: Left Arm)   Pulse Marland Kitchen)  58   Temp 97.4 F (36.3 C) (Oral)   Ht 5\' 6"  (1.676 m)   Wt 133 lb (60.3 kg)   BMI 21.47 kg/m         Assessment & Plan:  1. Essential hypertension -The blood pressure is elevated as usual today but lower than it has been in the past even when it was repeated. The home blood pressure readings have been running in the 120s over the 70s. She takes a copy of these readings when she goes back to see the nephrologist in June.  2. CKD (chronic kidney disease) stage 3, GFR 30-59 ml/min -The creatinine remains elevated but slightly improved from previously.  3. Vitamin D deficiency -The vitamin D level is good and she will continue with current treatment  4. Pure hypercholesterolemia -All cholesterol numbers were good and she will continue  with current treatment and aggressive therapeutic lifestyle changes  5. Paroxysmal atrial fibrillation (HCC) -The heart had a regular rate and rhythm today  6. Screening for osteoporosis - DG [facility] DEXA; Future  7. Postmenopausal - DG [facility] DEXA; Future  8. Epistaxis -This has resolved completely and the patient is now using nasal saline frequently for more sugar rising  9. Hematoma -The hematoma of the left dorsal hand is also resolved although still slight swelling where the hematoma was located.  No orders of the defined types were placed in this encounter.  Patient Instructions                       Medicare Annual Wellness Visit  [facility] and the medical providers at [facility] strive to bring you the best medical care.  In doing so we not only want to address your current medical conditions and concerns but also to detect new conditions early and prevent illness, disease and health-related problems.    Medicare offers a yearly Wellness Visit which allows our clinical staff to assess your need for preventative services including immunizations, lifestyle education, counseling to decrease risk of preventable diseases and screening for fall risk and other medical concerns.    This visit is provided free of charge (no copay) for all Medicare recipients. The clinical pharmacists at [facility] have begun to conduct these Wellness Visits which will also include a thorough review of all your medications.    As you primary medical provider recommend that you make an appointment for your Annual Wellness Visit if you have not done so already this year.  You may set up this appointment before you leave today or you may call back ([phone]) and schedule an appointment.  Please make sure when you call that you mention that you are scheduling your Annual Wellness Visit with the clinical pharmacist so that the appointment may be made for the proper  length of time.     Continue current medications. Continue good therapeutic lifestyle changes which include good diet and exercise. Fall precautions discussed with patient. If an FOBT was given today- please return it to our front desk. If you are over 67 years old - you may need Prevnar 46 or the adult Pneumonia vaccine.  **Flu shots are available--- please call and schedule a FLU-CLINIC appointment**  After your visit with [place] today you will receive a survey in the mail or online from [employer] regarding your care with [place]. Please take a moment to fill this out. Your feedback is very important to [place] as you can help  [place] better understand your patient needs as well as improve your experience and satisfaction. WE CARE ABOUT YOU!!!   We will call with results of the DEXA scan as soon as those results become available Follow-up with nephrologist as planned  Arrie Senate MD

## 2016-12-27 ENCOUNTER — Other Ambulatory Visit: Payer: Self-pay | Admitting: Family Medicine

## 2016-12-27 DIAGNOSIS — I10 Essential (primary) hypertension: Secondary | ICD-10-CM

## 2017-01-08 ENCOUNTER — Other Ambulatory Visit: Payer: PPO

## 2017-01-08 DIAGNOSIS — D72819 Decreased white blood cell count, unspecified: Secondary | ICD-10-CM

## 2017-01-09 LAB — CBC WITH DIFFERENTIAL/PLATELET
BASOS ABS: 0 10*3/uL (ref 0.0–0.2)
BASOS: 0 %
EOS (ABSOLUTE): 0 10*3/uL (ref 0.0–0.4)
Eos: 1 %
Hematocrit: 33.8 % — ABNORMAL LOW (ref 34.0–46.6)
Hemoglobin: 11.1 g/dL (ref 11.1–15.9)
IMMATURE GRANS (ABS): 0 10*3/uL (ref 0.0–0.1)
IMMATURE GRANULOCYTES: 0 %
LYMPHS: 31 %
Lymphocytes Absolute: 0.8 10*3/uL (ref 0.7–3.1)
MCH: 28.8 pg (ref 26.6–33.0)
MCHC: 32.8 g/dL (ref 31.5–35.7)
MCV: 88 fL (ref 79–97)
MONOS ABS: 0.4 10*3/uL (ref 0.1–0.9)
Monocytes: 15 %
NEUTROS PCT: 53 %
Neutrophils Absolute: 1.4 10*3/uL (ref 1.4–7.0)
PLATELETS: 139 10*3/uL — AB (ref 150–379)
RBC: 3.85 x10E6/uL (ref 3.77–5.28)
RDW: 13.4 % (ref 12.3–15.4)
WBC: 2.7 10*3/uL — AB (ref 3.4–10.8)

## 2017-01-14 ENCOUNTER — Other Ambulatory Visit: Payer: PPO

## 2017-01-14 DIAGNOSIS — Z1211 Encounter for screening for malignant neoplasm of colon: Secondary | ICD-10-CM | POA: Diagnosis not present

## 2017-01-18 LAB — PLEASE NOTE

## 2017-01-18 LAB — FECAL OCCULT BLOOD, IMMUNOCHEMICAL: FECAL OCCULT BLD: NEGATIVE

## 2017-01-22 DIAGNOSIS — N183 Chronic kidney disease, stage 3 (moderate): Secondary | ICD-10-CM | POA: Diagnosis not present

## 2017-01-22 DIAGNOSIS — I129 Hypertensive chronic kidney disease with stage 1 through stage 4 chronic kidney disease, or unspecified chronic kidney disease: Secondary | ICD-10-CM | POA: Diagnosis not present

## 2017-01-22 DIAGNOSIS — N2889 Other specified disorders of kidney and ureter: Secondary | ICD-10-CM | POA: Diagnosis not present

## 2017-01-22 DIAGNOSIS — E785 Hyperlipidemia, unspecified: Secondary | ICD-10-CM | POA: Diagnosis not present

## 2017-01-27 DIAGNOSIS — H2513 Age-related nuclear cataract, bilateral: Secondary | ICD-10-CM | POA: Diagnosis not present

## 2017-01-27 DIAGNOSIS — H43393 Other vitreous opacities, bilateral: Secondary | ICD-10-CM | POA: Diagnosis not present

## 2017-01-30 DIAGNOSIS — Z1231 Encounter for screening mammogram for malignant neoplasm of breast: Secondary | ICD-10-CM | POA: Diagnosis not present

## 2017-01-30 DIAGNOSIS — Z803 Family history of malignant neoplasm of breast: Secondary | ICD-10-CM | POA: Diagnosis not present

## 2017-04-15 ENCOUNTER — Ambulatory Visit (INDEPENDENT_AMBULATORY_CARE_PROVIDER_SITE_OTHER): Payer: PPO | Admitting: Family Medicine

## 2017-04-15 ENCOUNTER — Encounter: Payer: Self-pay | Admitting: Family Medicine

## 2017-04-15 VITALS — BP 192/71 | HR 58 | Temp 97.8°F

## 2017-04-15 DIAGNOSIS — S81812A Laceration without foreign body, left lower leg, initial encounter: Secondary | ICD-10-CM

## 2017-04-15 NOTE — Patient Instructions (Signed)
Leave compression dressing on for first 12 hours, keep dry for first 48 hours and after that may get wet little but do not soak for first week

## 2017-04-15 NOTE — Progress Notes (Signed)
   BP (!) 192/71 (BP Location: Left Arm, Patient Position: Sitting, Cuff Size: Normal)   Pulse (!) 58   Temp 97.8 F (36.6 C) (Oral)    Subjective:    Patient ID: [name], female    DOB: 1934-03-21, 83 y.o.   MRN: [id number]  HPI: [name] is a 83 y.o. female presenting on 04/15/2017 for Extremity Laceration (L lower leg, skin tear from bush,)   HPI Left leg laceration, skin tear from [place] Patient sustained laceration on her left leg while working outside when she snagged her leg on a branch from a bush. She did about an hour before coming in. She said it did bleed some initially but has not been bleeding significantly. She says the pain is minimal currently. She did take a Tylenol before she came. She says that she has very thin skin and has had one similar incident like this a few years ago.  Relevant past medical, surgical, family and social history reviewed and updated as indicated. Interim medical history since our last visit reviewed. Allergies and medications reviewed and updated.  Review of Systems  Constitutional: Negative for chills and fever.  Respiratory: Negative for chest tightness and shortness of breath.   Cardiovascular: Negative for chest pain and leg swelling.  Skin: Positive for wound. Negative for rash.  Neurological: Negative for light-headedness and headaches.  Psychiatric/Behavioral: Negative for agitation and behavioral problems.  All other systems reviewed and are negative.   Per HPI unless specifically indicated above        Objective:    BP (!) 192/71 (BP Location: Left Arm, Patient Position: Sitting, Cuff Size: Normal)   Pulse (!) 58   Temp 97.8 F (36.6 C) (Oral)   Wt Readings from Last 3 Encounters:  12/25/16 133 lb (60.3 kg)  12/02/16 130 lb (59 kg)  11/27/16 129 lb (58.5 kg)    Physical Exam  Constitutional: She appears well-developed and well-nourished. No distress.  Eyes: Conjunctivae are normal.  Musculoskeletal: Normal  range of motion.  Neurological: She is alert.  Skin: Skin is warm and dry. Laceration (4 cm jagged laceration on left shin, very thin skin) noted. No rash noted. She is not diaphoretic.  Nursing note and vitals reviewed.  Placed 5 Steri-Strips with compression dressing, able to achieve hemostasis, due to thin skin sutures would be unlikely to hold.     Assessment & Plan:   Problem List Items Addressed This Visit    None    Visit Diagnoses    Leg laceration, left, initial encounter    -  Primary   Very thin skin, difficult to place sutures, placed Steri-Strips and compression dressing over it, able to maintain hemostasis      Leave compression dressing on for first 12 hours, keep dry for first 48 hours and after that may get wet little but do not soak for first week Follow up plan: Return if symptoms worsen or fail to improve.  Counseling provided for all of the vaccine components No orders of the defined types were placed in this encounter.   Caryl Pina, MD [facility] 04/15/2017, 11:25 AM

## 2017-04-17 ENCOUNTER — Encounter: Payer: Self-pay | Admitting: Family

## 2017-04-17 ENCOUNTER — Ambulatory Visit (INDEPENDENT_AMBULATORY_CARE_PROVIDER_SITE_OTHER): Payer: PPO | Admitting: Family

## 2017-04-17 VITALS — BP 167/62 | HR 50 | Temp 97.9°F | Ht 66.0 in | Wt 131.4 lb

## 2017-04-17 DIAGNOSIS — S81812D Laceration without foreign body, left lower leg, subsequent encounter: Secondary | ICD-10-CM | POA: Diagnosis not present

## 2017-04-17 DIAGNOSIS — Z5189 Encounter for other specified aftercare: Secondary | ICD-10-CM

## 2017-04-17 NOTE — Patient Instructions (Signed)
Skin Tear Care A skin tear is a wound in which the top layers of skin have peeled off the deeper skin or tissues underneath them. This is a common problem as people get older because the skin becomes thinner and more fragile. In addition, some medicines, such as oral corticosteroids, can lead to skin thinning if they are taken for long periods of time. A skin tear is often repaired with tape or skin adhesive strips. Depending on the location of the wound, a bandage (dressing) may be applied over the tape or skin adhesive strips. Follow these instructions at home: Wound care  Clean the wound as told by your health care provider. You may be instructed to keep the wound dry for the first few days. If you are told to clean the wound: ? Wash the wound with mild soap and water or a salt-water (saline) solution. ? Rinse the wound with water to remove all soap. ? Do not rub the wound dry. Let the wound air dry.  Change any dressings as told by your health care provider. This includes changing the dressing if it gets wet, gets dirty, or starts to smell bad.  Do not scratch or pick at the wound.  Protect the injured area until it has healed.  Check your wound every day for signs of infection. Check for: ? More redness, swelling, or pain. ? More fluid or blood. ? Warmth. ? Pus or a bad smell. Medicines   Take over-the-counter and prescription medicines only as told by your health care provider.  If you were prescribed an antibiotic medicine, take or apply it as told by your health care provider. Do not stop using the antibiotic even if your condition improves. General instructions  Keep the dressing dry as told by your health care provider.  Do not take baths, swim, or do anything that puts your wound underwater until your health care provider approves.  Keep all follow-up visits as told by your health care provider. This is important. Contact a health care provider if:  You have more  redness, swelling, or pain around your wound.  You have more fluid or blood coming from your wound.  Your wound feels warm to the touch.  You have pus or a bad smell coming from your wound. Get help right away if:  You have a red streak that goes away from the skin tear.  You have a fever and chills and your symptoms suddenly get worse. This information is not intended to replace advice given to you by your health care provider. Make sure you discuss any questions you have with your health care provider. Document Released: 04/29/2001 Document Revised: 03/30/2016 Document Reviewed: 06/25/2015 Elsevier Interactive Patient Education  2018 [employer]  

## 2017-04-17 NOTE — Progress Notes (Signed)
   Subjective:    Patient ID: [name], female    DOB: 07-23-1934, 83 y.o.   MRN: [id number]  PT presents to the office today to recheck left lower leg wound. Pt states had a laceration on 04/15/17 and had steri-strips placed. Pt denies any fever or discharge. Wound Check  She was originally treated 3 to 5 days ago. There has been no drainage from the wound. The redness has improved. There is no swelling present. The pain has improved. She has no difficulty moving the affected extremity or digit.      Review of Systems  Skin: Positive for wound.  All other systems reviewed and are negative.      Objective:   Physical Exam  Constitutional: She is oriented to person, place, and time. She appears well-developed and well-nourished.  Cardiovascular: Normal rate and regular rhythm.   Murmur heard. Pulmonary/Chest: Effort normal and breath sounds normal.  Musculoskeletal: Normal range of motion.  Neurological: She is alert and oriented to person, place, and time.  Skin: Skin is warm and dry.  Skin tear on left lower leg. No discharge, redness, or warmth     Area cleaned, Vaseline dressing applied, gauze dressing.  Steri-strips in place  BP (!) 167/62   Pulse (!) 50   Temp 97.9 F (36.6 C) (Oral)   Ht 5\' 6"  (1.676 m)   Wt 131 lb 6.4 oz (59.6 kg)   BMI 21.21 kg/m      Assessment & Plan:  1. Skin tear of left lower leg without complication, subsequent encounter  2. Visit for wound check   Report any increase in erythemas, swelling, pain, or discharge Doing daily dressing change with Vaseline dressing RTO prn   [name], FNP

## 2017-04-21 ENCOUNTER — Other Ambulatory Visit: Payer: PPO

## 2017-04-21 DIAGNOSIS — E785 Hyperlipidemia, unspecified: Secondary | ICD-10-CM | POA: Diagnosis not present

## 2017-04-21 DIAGNOSIS — E559 Vitamin D deficiency, unspecified: Secondary | ICD-10-CM | POA: Diagnosis not present

## 2017-04-21 DIAGNOSIS — N183 Chronic kidney disease, stage 3 unspecified: Secondary | ICD-10-CM

## 2017-04-22 LAB — HEPATIC FUNCTION PANEL
ALT: 9 IU/L (ref 0–32)
AST: 20 IU/L (ref 0–40)
Albumin: 4.6 g/dL (ref 3.5–4.7)
Alkaline Phosphatase: 65 IU/L (ref 39–117)
BILIRUBIN, DIRECT: 0.12 mg/dL (ref 0.00–0.40)
Bilirubin Total: 0.4 mg/dL (ref 0.0–1.2)
TOTAL PROTEIN: 7.1 g/dL (ref 6.0–8.5)

## 2017-04-22 LAB — CBC WITH DIFFERENTIAL/PLATELET
BASOS: 1 %
Basophils Absolute: 0 10*3/uL (ref 0.0–0.2)
EOS (ABSOLUTE): 0 10*3/uL (ref 0.0–0.4)
Eos: 1 %
HEMOGLOBIN: 11.4 g/dL (ref 11.1–15.9)
Hematocrit: 33.3 % — ABNORMAL LOW (ref 34.0–46.6)
IMMATURE GRANS (ABS): 0 10*3/uL (ref 0.0–0.1)
IMMATURE GRANULOCYTES: 0 %
LYMPHS: 34 %
Lymphocytes Absolute: 0.8 10*3/uL (ref 0.7–3.1)
MCH: 29.2 pg (ref 26.6–33.0)
MCHC: 34.2 g/dL (ref 31.5–35.7)
MCV: 85 fL (ref 79–97)
MONOCYTES: 12 %
Monocytes Absolute: 0.3 10*3/uL (ref 0.1–0.9)
NEUTROS ABS: 1.2 10*3/uL — AB (ref 1.4–7.0)
NEUTROS PCT: 52 %
Platelets: 99 10*3/uL — CL (ref 150–379)
RBC: 3.91 x10E6/uL (ref 3.77–5.28)
RDW: 14.3 % (ref 12.3–15.4)
WBC: 2.2 10*3/uL — CL (ref 3.4–10.8)

## 2017-04-22 LAB — VITAMIN D 25 HYDROXY (VIT D DEFICIENCY, FRACTURES): Vit D, 25-Hydroxy: 65.4 ng/mL (ref 30.0–100.0)

## 2017-04-22 LAB — LIPID PANEL
Chol/HDL Ratio: 2.5 ratio (ref 0.0–4.4)
Cholesterol, Total: 138 mg/dL (ref 100–199)
HDL: 55 mg/dL (ref >39)
LDL CALC: 67 mg/dL (ref 0–99)
Triglycerides: 80 mg/dL (ref 0–149)
VLDL CHOLESTEROL CAL: 16 mg/dL (ref 5–40)

## 2017-04-22 LAB — BMP8+EGFR
BUN / CREAT RATIO: 18 (ref 12–28)
BUN: 24 mg/dL (ref 8–27)
CO2: 27 mmol/L (ref 20–29)
Calcium: 9.1 mg/dL (ref 8.7–10.3)
Chloride: 97 mmol/L (ref 96–106)
Creatinine, Ser: 1.33 mg/dL — ABNORMAL HIGH (ref 0.57–1.00)
GFR calc Af Amer: 43 mL/min/{1.73_m2} — ABNORMAL LOW (ref >59)
GFR, EST NON AFRICAN AMERICAN: 38 mL/min/{1.73_m2} — AB (ref >59)
GLUCOSE: 106 mg/dL — AB (ref 65–99)
POTASSIUM: 3.9 mmol/L (ref 3.5–5.2)
Sodium: 139 mmol/L (ref 134–144)

## 2017-04-27 DIAGNOSIS — N183 Chronic kidney disease, stage 3 (moderate): Secondary | ICD-10-CM | POA: Diagnosis not present

## 2017-04-27 DIAGNOSIS — D696 Thrombocytopenia, unspecified: Secondary | ICD-10-CM | POA: Diagnosis not present

## 2017-04-27 DIAGNOSIS — N2889 Other specified disorders of kidney and ureter: Secondary | ICD-10-CM | POA: Diagnosis not present

## 2017-04-27 DIAGNOSIS — E785 Hyperlipidemia, unspecified: Secondary | ICD-10-CM | POA: Diagnosis not present

## 2017-04-27 DIAGNOSIS — I129 Hypertensive chronic kidney disease with stage 1 through stage 4 chronic kidney disease, or unspecified chronic kidney disease: Secondary | ICD-10-CM | POA: Diagnosis not present

## 2017-04-28 ENCOUNTER — Encounter: Payer: Self-pay | Admitting: Family Medicine

## 2017-04-28 ENCOUNTER — Ambulatory Visit (INDEPENDENT_AMBULATORY_CARE_PROVIDER_SITE_OTHER): Payer: PPO | Admitting: Family Medicine

## 2017-04-28 VITALS — BP 200/80 | HR 55 | Temp 96.9°F | Ht 66.0 in | Wt 130.0 lb

## 2017-04-28 DIAGNOSIS — E559 Vitamin D deficiency, unspecified: Secondary | ICD-10-CM | POA: Diagnosis not present

## 2017-04-28 DIAGNOSIS — I10 Essential (primary) hypertension: Secondary | ICD-10-CM

## 2017-04-28 DIAGNOSIS — S81812D Laceration without foreign body, left lower leg, subsequent encounter: Secondary | ICD-10-CM | POA: Diagnosis not present

## 2017-04-28 DIAGNOSIS — N183 Chronic kidney disease, stage 3 unspecified: Secondary | ICD-10-CM

## 2017-04-28 DIAGNOSIS — E7800 Pure hypercholesterolemia, unspecified: Secondary | ICD-10-CM

## 2017-04-28 DIAGNOSIS — I480 Paroxysmal atrial fibrillation: Secondary | ICD-10-CM

## 2017-04-28 NOTE — Patient Instructions (Addendum)
Medicare Annual Wellness Visit  [name] and the medical providers at [facility] strive to bring you the best medical care.  In doing so we not only want to address your current medical conditions and concerns but also to detect new conditions early and prevent illness, disease and health-related problems.    Medicare offers a yearly Wellness Visit which allows our clinical staff to assess your need for preventative services including immunizations, lifestyle education, counseling to decrease risk of preventable diseases and screening for fall risk and other medical concerns.    This visit is provided free of charge (no copay) for all Medicare recipients. The clinical pharmacists at [facility] have begun to conduct these Wellness Visits which will also include a thorough review of all your medications.    As you primary medical provider recommend that you make an appointment for your Annual Wellness Visit if you have not done so already this year.  You may set up this appointment before you leave today or you may call back ([phone]) and schedule an appointment.  Please make sure when you call that you mention that you are scheduling your Annual Wellness Visit with the clinical pharmacist so that the appointment may be made for the proper length of time.     Continue current medications. Continue good therapeutic lifestyle changes which include good diet and exercise. Fall precautions discussed with patient. If an FOBT was given today- please return it to our front desk. If you are over 25 years old - you may need Prevnar 77 or the adult Pneumonia vaccine.  **Flu shots are available--- please call and schedule a FLU-CLINIC appointment**  After your visit with [place] today you will receive a survey in the mail or online from [employer] regarding your care with [place]. Please take a moment to fill this out. Your feedback is very  important to [place] as you can help [place] better understand your patient needs as well as improve your experience and satisfaction. WE CARE ABOUT YOU!!!   Follow-up with hematology as planned Follow-up with cardiology as planned Discuss with cardiologist your blood work results as best he regarding flecainide and see what his suggestions are Stay on at least a baby aspirin 3 times a week Return to the office in a couple weeks to potentially remove Steri-Strips and start applying warm saline compresses in one week which would facilitate the Steri-Strips to remove themselves

## 2017-04-28 NOTE — Progress Notes (Signed)
Subjective:    Patient ID: [name], female    DOB: [birth year], 83 y.o.   MRN: [id number]  HPI Pt here for follow up and management of chronic medical problems which includes hyperlipidemia and hypertension. She is taking medication regularly.The patient is doing well overall. She will like for [place] to recheck her leg wound that is 29 weeks old. She has had ongoing problems with her blood pressure and sees the nephrologist regularly. She has had recent blood work done and this will be reviewed with the patient during the visit today. All of her liver function tests were normal. The CBC had a decreased white blood cell count. She has seen the hematologist about this in the past. Her hemoglobin was stable at 11.4. She also has a decreased platelet count. This is stable and slightly lower than in past readings. All cholesterol numbers with traditional lipid testing were good. The vitamin D level was good at 65.4. The blood sugar was slightly elevated at 106 with a creatinine that was 1.33 and this has been higher in the past. All the electrolytes including potassium are good. The patient has seen the hematologist the neurologist and has been followed by the ear nose and throat specialist because of epistaxis. The patient is doing well overall and did see the nephrologist yesterday he was going to be retiring. She will be seeing Dr. Posey Pronto in the future in about 6-7 months. The patient denies any chest pain or shortness of breath. She's had no trouble with her GI tract including nausea vomiting diarrhea blood in the stool or black tarry bowel movements. She is passing her water without problems. She denies any shortness of breath. She saw the nephrologist yesterday and her blood pressure systolic was over 836. He is made no changes based on the home reading she's been getting which are good and will be scanned into the record.     Patient Active Problem List   Diagnosis Date Noted  . Hematoma 11/27/2016    . Severe epistaxis 11/23/2016  . Vertigo 09/09/2016  . Renal vascular disease 10/31/2015  . Nodule of right lung 10/31/2015  . Asthma without status asthmaticus 10/31/2015  . Essential hypertension, malignant   . Hypothyroidism 10/15/2015  . Pancytopenia ([place]) 10/15/2015  . Hyponatremia 10/14/2015  . Thrombocytopenia ([place]) 06/16/2015  . Right renal artery stenosis ([place]) 05/09/2015  . Chronic diastolic heart failure ([place]) 04/03/2014  . Acute diastolic heart failure ([place]) 04/03/2014  . Hypertensive urgency 04/02/2014  . Headache 04/02/2014  . CKD (chronic kidney disease) stage 3, GFR 30-59 ml/min 04/02/2014  . Paroxysmal atrial fibrillation ([place]) 01/25/2014  . Hypokalemia 07/19/2013  . Anemia 07/19/2013  . Bilateral lower extremity edema 06/30/2013  . Hypertension with fluid overload 06/30/2013  . Need for prophylactic vaccination and inoculation against influenza 05/31/2013  . Pedal edema 05/10/2013  . Varicose veins of lower extremities with other complications 62/94/7654  . ABNORMAL STRESS ELECTROCARDIOGRAM 01/23/2010  . Hyperlipidemia 12/18/2009  . Essential hypertension 12/18/2009  . RENAL INSUFFICIENCY, CHRONIC 12/18/2009  . ARTHRITIS 12/18/2009   Outpatient Encounter Prescriptions as of 04/28/2017  Medication Sig  . aspirin EC 81 MG tablet Take 81 mg by mouth See admin instructions. Take 1 tablet (81 mg) by mouth every other night  . atenolol-chlorthalidone (TENORETIC) 50-25 MG tablet TAKE 1 TABLET BY MOUTH DAILY.  Marland Kitchen atorvastatin (LIPITOR) 20 MG tablet TAKE 1/2 TABLET DAILY  . calcium-vitamin D (OSCAL WITH D) 500-200 MG-UNIT per tablet Take 1 tablet by  mouth at bedtime.   . cholecalciferol (VITAMIN D) 1000 units tablet Take 1,000 Units by mouth at bedtime.   . cloNIDine (CATAPRES) 0.1 MG tablet Take 0.1 mg by mouth See admin instructions. Take 1 tablet (0.1 mg) by mouth daily at bedtime - may also take 1 tablet (0.1 mg) during the day as needed for SBP >160  . ferrous  sulfate ([employer] IRON) 325 (65 FE) MG tablet Take 325 mg by mouth 2 (two) times daily with a meal.  . flecainide (TAMBOCOR) 50 MG tablet Take 1 tablet (50 mg total) by mouth 2 (two) times daily.  . fluticasone (FLONASE) 50 MCG/ACT nasal spray Place 1 spray into both nostrils 2 (two) times daily as needed for allergies or rhinitis.   . hydrALAZINE (APRESOLINE) 50 MG tablet Take 50 mg by mouth 2 (two) times daily.   . hydroxypropyl methylcellulose / hypromellose (ISOPTO TEARS / GONIOVISC) 2.5 % ophthalmic solution Place 1 drop into both eyes daily.   Marland Kitchen levothyroxine (SYNTHROID, LEVOTHROID) 75 MCG tablet TAKE 1 TABLET (75 MCG TOTAL) BY MOUTH DAILY. (Patient taking differently: TAKE 1 TABLET (75 MCG TOTAL) BY MOUTH DAILY BEFORE BREAKFAST)  . lisinopril (PRINIVIL,ZESTRIL) 40 MG tablet TAKE 1 TABLET BY MOUTH TWICE A DAY (Patient taking differently: TAKE 1 TABLET BY MOUTH DAILY)  . Omega-3 Fatty Acids (FISH OIL) 1200 MG CAPS Take 1,200 mg by mouth daily.  . sodium chloride (OCEAN) 0.65 % SOLN nasal spray Place 1 spray into both nostrils 2 (two) times daily.    No facility-administered encounter medications on file as of 04/28/2017.      Review of Systems  Constitutional: Negative.   HENT: Negative.   Eyes: Negative.   Respiratory: Negative.   Cardiovascular: Negative.   Gastrointestinal: Negative.   Endocrine: Negative.   Genitourinary: Negative.   Musculoskeletal: Negative.   Skin: Positive for wound (rck left lower leg (2 weeks ago)).  Allergic/Immunologic: Negative.   Neurological: Negative.   Hematological: Negative.   Psychiatric/Behavioral: Negative.        Objective:   Physical Exam  Constitutional: She is oriented to person, place, and time. She appears well-developed and well-nourished. No distress.  The patient is pleasant and alert and looks great.  HENT:  Head: Normocephalic and atraumatic.  Right Ear: External ear normal.  Left Ear: External ear normal.  Nose: Nose normal.    Mouth/Throat: Oropharynx is clear and moist. No oropharyngeal exudate.  Eyes: Pupils are equal, round, and reactive to light. Conjunctivae and EOM are normal. Right eye exhibits no discharge. Left eye exhibits no discharge. No scleral icterus.  Neck: Normal range of motion. Neck supple. No thyromegaly present.  Bilateral carotid bruits, no thyroid enlargement and no adenopathy  Cardiovascular: Normal rate, regular rhythm and intact distal pulses.   Murmur heard. The heart is regular at 60/m with a grade 2/6 systolic ejection murmur  Pulmonary/Chest: Effort normal and breath sounds normal. No respiratory distress. She has no wheezes. She has no rales.  Clear anteriorly and posteriorly  Abdominal: Soft. Bowel sounds are normal. She exhibits no mass. There is no tenderness. There is no rebound and no guarding.  No abdominal tenderness masses bruits or organ enlargement  Musculoskeletal: Normal range of motion. She exhibits no edema.  Lymphadenopathy:    She has no cervical adenopathy.  Neurological: She is alert and oriented to person, place, and time. She has normal reflexes. No cranial nerve deficit.  Skin: Skin is warm and dry. No rash noted. No erythema.  Skin laceration of lower left leg appears to be healing well. Steri-Strips are still in place. No redness noted. We'll let Steri-Strips stay on for a while longer and she will start using warm saline compresses in about a week to the area 20 minutes twice daily and will return to the office a week after that for [place] to remove the Steri-Strips if they have not removed themselves by that time.  Psychiatric: She has a normal mood and affect. Her behavior is normal. Judgment and thought content normal.  Nursing note and vitals reviewed.  BP (!) 169/62 (BP Location: Left Arm)   Pulse (!) 55   Temp (!) 96.9 F (36.1 C) (Oral)   Ht 5\' 6"  (1.676 m)   Wt 130 lb (59 kg)   BMI 20.98 kg/m        Assessment & Plan:  1. CKD (chronic kidney  disease) stage 3, GFR 30-59 ml/min -The patient just saw the nephrologist yesterday and she will be changing nephrologists as Dr. Mercy Moore is going to be retiring. She'll be seeing Dr. Posey Pronto in the future and has an appointment with him in about 6 months. The blood pressure at her visit yesterday was also elevated like it was in her office. Her home readings however are good and we will continue to let her take her home readings and monitor the blood pressures as she is currently doing.  2. Essential hypertension -Continue current treatment and follow-up with nephrologist as planned  3. Vitamin D deficiency -Continue current treatment  4. Pure hypercholesterolemia -Continue current treatment and as aggressive therapeutic lifestyle changes as possible  5. Paroxysmal atrial fibrillation (HCC) -The heart today had a regular rate and rhythm. She will follow-up with cardiology as planned  6. Skin tear of left lower leg without complication, subsequent encounter -The skin tear appears to be healing but Steri-Strips will not be removed at this point in time we will look at the leg again in about 4 weeks after she's been using some warm wet compresses to the Steri-Strips area and go from there.  Patient Instructions                       Medicare Annual Wellness Visit  [facility] and the medical providers at [facility] strive to bring you the best medical care.  In doing so we not only want to address your current medical conditions and concerns but also to detect new conditions early and prevent illness, disease and health-related problems.    Medicare offers a yearly Wellness Visit which allows our clinical staff to assess your need for preventative services including immunizations, lifestyle education, counseling to decrease risk of preventable diseases and screening for fall risk and other medical concerns.    This visit is provided free of charge (no copay) for all  Medicare recipients. The clinical pharmacists at [facility] have begun to conduct these Wellness Visits which will also include a thorough review of all your medications.    As you primary medical provider recommend that you make an appointment for your Annual Wellness Visit if you have not done so already this year.  You may set up this appointment before you leave today or you may call back ([phone]) and schedule an appointment.  Please make sure when you call that you mention that you are scheduling your Annual Wellness Visit with the clinical pharmacist so that the appointment may be made for the proper  length of time.     Continue current medications. Continue good therapeutic lifestyle changes which include good diet and exercise. Fall precautions discussed with patient. If an FOBT was given today- please return it to our front desk. If you are over 23 years old - you may need Prevnar 74 or the adult Pneumonia vaccine.  **Flu shots are available--- please call and schedule a FLU-CLINIC appointment**  After your visit with [place] today you will receive a survey in the mail or online from [employer] regarding your care with [place]. Please take a moment to fill this out. Your feedback is very important to [place] as you can help [place] better understand your patient needs as well as improve your experience and satisfaction. WE CARE ABOUT YOU!!!   Follow-up with hematology as planned Follow-up with cardiology as planned Discuss with cardiologist your blood work results as best he regarding flecainide and see what his suggestions are Stay on at least a baby aspirin 3 times a week Return to the office in a couple weeks to potentially remove Steri-Strips and start applying warm saline compresses in one week which would facilitate the Steri-Strips to remove themselves    Arrie Senate MD

## 2017-05-01 ENCOUNTER — Encounter: Payer: Self-pay | Admitting: Pediatrics

## 2017-05-01 ENCOUNTER — Ambulatory Visit (INDEPENDENT_AMBULATORY_CARE_PROVIDER_SITE_OTHER): Payer: PPO | Admitting: Pediatrics

## 2017-05-01 VITALS — BP 189/64 | HR 57 | Temp 98.0°F | Ht 66.0 in | Wt 130.8 lb

## 2017-05-01 DIAGNOSIS — S81812D Laceration without foreign body, left lower leg, subsequent encounter: Secondary | ICD-10-CM | POA: Diagnosis not present

## 2017-05-01 DIAGNOSIS — L03115 Cellulitis of right lower limb: Secondary | ICD-10-CM

## 2017-05-01 MED ORDER — MUPIROCIN 2 % EX OINT: TOPICAL_OINTMENT | CUTANEOUS | 1 refills | Status: DC

## 2017-05-01 MED ORDER — CEPHALEXIN 500 MG PO CAPS: 500.0000 mg | ORAL_CAPSULE | Freq: Two times a day (BID) | ORAL | 0 refills | Status: DC

## 2017-05-01 NOTE — Progress Notes (Signed)
  Subjective:   Patient ID: [name], female    DOB: 02-Jul-1934, 83 y.o.   MRN: [id number] CC: Cut Left leg  HPI: HAYVEN CROY is a 83 y.o. female presenting for Cut Left leg  Happened 2 weeks ago [place] on stick/bush in yard Skin was too thin for stitches per chart review, steri strips placed with control of bleeding Has had two f/u appts since then Laceration slowly healing Has had pain, redness, swelling around laceration recently, she isnt sure exactly when it started Area has been sore since the beginning Swelling that gets worse throughtout the day  Doesn't go away completely after sleeping supine at night No fevers or chills Minimal discharge on bandage  Last tetanus 10/2010  HTN: persistently elevated in clinic appts Follows with nephrologist, last seen last week with similar numbers No CP, no HA  Relevant past medical, surgical, family and social history reviewed. Allergies and medications reviewed and updated. History  Smoking Status  . Never Smoker  Smokeless Tobacco  . Never Used    Comment: Pt denies cigarettes   ROS: Per HPI   Objective:    BP (!) 189/64   Pulse (!) 57   Temp 98 F (36.7 C) (Oral)   Ht 5\' 6"  (1.676 m)   Wt 130 lb 12.8 oz (59.3 kg)   BMI 21.11 kg/m   Wt Readings from Last 3 Encounters:  05/01/17 130 lb 12.8 oz (59.3 kg)  04/28/17 130 lb (59 kg)  04/17/17 131 lb 6.4 oz (59.6 kg)    Gen: NAD, alert, cooperative with exam, NCAT EYES: EOMI, no conjunctival injection, or no icterus CV: NRRR, normal S1/S2 Resp: CTABL, no wheezes, normal WOB Abd: +BS, soft, NTND.  Neuro: Alert and oriented MSK: normal muscle bulk Skin: approx 4 cm by 2 cm area of abrasion nd laceration More distal portion healing well Still with very thin skin flap apprx 1 cm prox portion, moist red tissue beneath No active bleeding Slight pink with some ttp skin around laceration 2-3 cm  Assessment & Plan:  Rebbecca was seen today for cut left leg.  Diagnoses  and all orders for this visit:  Cellulitis of right lower extremity Treat with below x 7 days, renal dosing -     cephALEXin (KEFLEX) 500 MG capsule; Take 1 capsule (500 mg total) by mouth 2 (two) times daily.  Laceration of left lower extremity, subsequent encounter Healing, some redness and tenderness around wound, treating for cellulitis as above No bleeding Cont healing by secondary intention Discussed wound care, antibiotic ointment BID, keep covered Ok to wash with soap and water  Last tetanus >33yrs ago with dirty wound Needs tetanus, none available in clinic today Will rtc next week for tetanus, d/w pt -     mupirocin ointment (BACTROBAN) 2 %; Use twice a day on affected areas  HTN Elevated SBP, DBP normal to low Cont to check at home, if persistently elevated let us know  Follow up plan: Return in about 2 weeks (around 05/15/2017). Assunta Found, MD [place]

## 2017-05-08 ENCOUNTER — Other Ambulatory Visit: Payer: Self-pay | Admitting: Family Medicine

## 2017-05-11 NOTE — Telephone Encounter (Signed)
Last thyroid 10/15/15

## 2017-05-12 ENCOUNTER — Ambulatory Visit (INDEPENDENT_AMBULATORY_CARE_PROVIDER_SITE_OTHER): Payer: PPO | Admitting: Family Medicine

## 2017-05-12 ENCOUNTER — Encounter: Payer: Self-pay | Admitting: Family Medicine

## 2017-05-12 VITALS — BP 191/68 | HR 50 | Temp 97.5°F | Ht 66.0 in | Wt 131.0 lb

## 2017-05-12 DIAGNOSIS — I10 Essential (primary) hypertension: Secondary | ICD-10-CM

## 2017-05-12 DIAGNOSIS — S81812D Laceration without foreign body, left lower leg, subsequent encounter: Secondary | ICD-10-CM

## 2017-05-12 NOTE — Progress Notes (Signed)
Subjective:    Patient ID: [name], female    DOB: [birth year], 83 y.o.   MRN: [id number]  HPI Pt here for follow up of left lower leg wound.The patient has finished her antibiotics. She stopped using the ointment because it seemed to keep the wounds weeping more and dressings were becoming adherent to the wounds. The 2 wounds on the left leg now appear to be drying up with eschar is being present. There is no redness or cellulitis.    Patient Active Problem List   Diagnosis Date Noted  . Hematoma 11/27/2016  . Severe epistaxis 11/23/2016  . Vertigo 09/09/2016  . Renal vascular disease 10/31/2015  . Nodule of right lung 10/31/2015  . Asthma without status asthmaticus 10/31/2015  . Essential hypertension, malignant   . Hypothyroidism 10/15/2015  . Pancytopenia ([place]) 10/15/2015  . Hyponatremia 10/14/2015  . Thrombocytopenia ([place]) 06/16/2015  . Right renal artery stenosis ([place]) 05/09/2015  . Chronic diastolic heart failure ([place]) 04/03/2014  . Acute diastolic heart failure ([place]) 04/03/2014  . Hypertensive urgency 04/02/2014  . Headache 04/02/2014  . CKD (chronic kidney disease) stage 3, GFR 30-59 ml/min 04/02/2014  . Paroxysmal atrial fibrillation ([place]) 01/25/2014  . Hypokalemia 07/19/2013  . Anemia 07/19/2013  . Bilateral lower extremity edema 06/30/2013  . Hypertension with fluid overload 06/30/2013  . Need for prophylactic vaccination and inoculation against influenza 05/31/2013  . Pedal edema 05/10/2013  . Varicose veins of lower extremities with other complications 56/31/4970  . ABNORMAL STRESS ELECTROCARDIOGRAM 01/23/2010  . Hyperlipidemia 12/18/2009  . Essential hypertension 12/18/2009  . RENAL INSUFFICIENCY, CHRONIC 12/18/2009  . ARTHRITIS 12/18/2009   Outpatient Encounter Prescriptions as of 05/12/2017  Medication Sig  . aspirin EC 81 MG tablet Take 81 mg by mouth See admin instructions. Take 1 tablet (81 mg) by mouth every other night  .  atenolol-chlorthalidone (TENORETIC) 50-25 MG tablet TAKE 1 TABLET BY MOUTH DAILY.  Marland Kitchen atorvastatin (LIPITOR) 20 MG tablet TAKE 1/2 TABLET DAILY  . calcium-vitamin D (OSCAL WITH D) 500-200 MG-UNIT per tablet Take 1 tablet by mouth at bedtime.   . cholecalciferol (VITAMIN D) 1000 units tablet Take 1,000 Units by mouth at bedtime.   . cloNIDine (CATAPRES) 0.1 MG tablet Take 0.1 mg by mouth See admin instructions. Take 1 tablet (0.1 mg) by mouth daily at bedtime - may also take 1 tablet (0.1 mg) during the day as needed for SBP >160  . ferrous sulfate ([employer] IRON) 325 (65 FE) MG tablet Take 325 mg by mouth 2 (two) times daily with a meal.  . flecainide (TAMBOCOR) 50 MG tablet Take 1 tablet (50 mg total) by mouth 2 (two) times daily.  . fluticasone (FLONASE) 50 MCG/ACT nasal spray Place 1 spray into both nostrils 2 (two) times daily as needed for allergies or rhinitis.   . hydrALAZINE (APRESOLINE) 50 MG tablet Take 50 mg by mouth 2 (two) times daily.   . hydroxypropyl methylcellulose / hypromellose (ISOPTO TEARS / GONIOVISC) 2.5 % ophthalmic solution Place 1 drop into both eyes daily.   Marland Kitchen levothyroxine (SYNTHROID, LEVOTHROID) 75 MCG tablet TAKE 1 TABLET (75 MCG TOTAL) BY MOUTH DAILY.  Marland Kitchen lisinopril (PRINIVIL,ZESTRIL) 40 MG tablet TAKE 1 TABLET BY MOUTH TWICE A DAY (Patient taking differently: TAKE 1 TABLET BY MOUTH DAILY)  . Omega-3 Fatty Acids (FISH OIL) 1200 MG CAPS Take 1,200 mg by mouth daily.  . sodium chloride (OCEAN) 0.65 % SOLN nasal spray Place 1 spray into both nostrils 2 (two) times daily.   Marland Kitchen  mupirocin ointment (BACTROBAN) 2 % Use twice a day on affected areas (Patient not taking: Reported on 05/12/2017)  . [DISCONTINUED] cephALEXin (KEFLEX) 500 MG capsule Take 1 capsule (500 mg total) by mouth 2 (two) times daily.   No facility-administered encounter medications on file as of 05/12/2017.       Review of Systems  Constitutional: Negative.   HENT: Negative.   Eyes: Negative.   Respiratory:  Negative.   Cardiovascular: Negative.   Gastrointestinal: Negative.   Endocrine: Negative.   Genitourinary: Negative.   Musculoskeletal: Negative.   Skin: Positive for wound (healing -left lower leg wound).  Allergic/Immunologic: Negative.   Neurological: Negative.   Hematological: Negative.   Psychiatric/Behavioral: Negative.        Objective:   Physical Exam  Constitutional: She appears well-developed and well-nourished. No distress.  HENT:  Head: Normocephalic.  Eyes: Pupils are equal, round, and reactive to light. Conjunctivae and EOM are normal. Right eye exhibits no discharge. Left eye exhibits no discharge. No scleral icterus.  Neck: Normal range of motion.  Musculoskeletal: Normal range of motion.  Neurological: She is alert.  Skin: Skin is warm and dry. No erythema.  Psychiatric: She has a normal mood and affect. Her behavior is normal. Judgment and thought content normal.  Nursing note and vitals reviewed.  BP (!) 191/68 (BP Location: Left Arm)   Pulse (!) 50   Temp (!) 97.5 F (36.4 C) (Oral)   Ht 5\' 6"  (1.676 m)   Wt 131 lb (59.4 kg)   BMI 21.14 kg/m   The leg laceration and wound appeared to be healing well with eschars in 2 different locations with no surrounding erythema or redness. The area had a dry dressing applied and was wrapped with [place] and the patient was instructed to do this after taking a bath or shower and again during the day if she was in an area where the wound could be irritated or get easily. She will apply the dressing at nighttime so she doesn't reinjure it during her sleep. And she will use the dressing during the day as needed. She can take a bath or shower and she will let the wound dry out before applying the dressing. No more antibiotics are necessary.      Assessment & Plan:  1. Skin tear of left lower leg without complication, subsequent encounter -This appears to continue to be healing without surrounding erythema and no drainage  today. She has finished her antibiotics and no or antibiotics will be prescribed. She understands that this may take several more weeks to heal completely and she will get back in touch with [place] if there is any new developing redness or drainage. She will apply dressings primarily at nighttime but will use dressings during the day if she is in an area where she could re-irritated her leg.  2. Essential hypertension -The blood pressure remains elevated and this is typical for her in the office. Her home readings are better and the nephrologist is aware of these high readings that she has in the doctor's office.  Patient Instructions  Continue with daily dressing changes especially using the dressings at nighttime and during the day if you're in the area where there is dust or dirt. You may take a shower and just pat the wound and dry gently with sterile gauze and before going to bed apply the gauze along with the cleaning dressing and hold this together with tape. If any redness recurs get back in touch  with [place] at that time This may take a few more weeks for this to heal up completely and for this Or eschar to come off of the wound. Protected especially at nighttime.  Arrie Senate MD

## 2017-05-12 NOTE — Patient Instructions (Signed)
Continue with daily dressing changes especially using the dressings at nighttime and during the day if you're in the area where there is dust or dirt. You may take a shower and just pat the wound and dry gently with sterile gauze and before going to bed apply the gauze along with the cleaning dressing and hold this together with tape. If any redness recurs get back in touch with [place] at that time This may take a few more weeks for this to heal up completely and for this Or eschar to come off of the wound. Protected especially at nighttime.

## 2017-05-25 ENCOUNTER — Ambulatory Visit (HOSPITAL_COMMUNITY): Payer: Self-pay

## 2017-05-29 ENCOUNTER — Other Ambulatory Visit: Payer: Self-pay | Admitting: Family Medicine

## 2017-06-01 ENCOUNTER — Ambulatory Visit (INDEPENDENT_AMBULATORY_CARE_PROVIDER_SITE_OTHER): Payer: PPO

## 2017-06-01 DIAGNOSIS — Z23 Encounter for immunization: Secondary | ICD-10-CM

## 2017-06-13 ENCOUNTER — Other Ambulatory Visit: Payer: Self-pay | Admitting: Family Medicine

## 2017-06-13 DIAGNOSIS — I10 Essential (primary) hypertension: Secondary | ICD-10-CM

## 2017-06-21 NOTE — Progress Notes (Signed)
Cardiology Office Note   Date:  06/24/2017   ID:  [name], DOB [birth year], MRN [id number]  PCP:  Chipper Herb, MD  Cardiologist:   Minus Breeding, MD    Chief Complaint  Patient presents with  . Palpitations      History of Present Illness: [name] is a 83 y.o. female who presents for follow up of difficult to control with chronic diastolic CHF, and arrhythmias - seen by Dr Caryl Comes - left atrial tachycardias, that resolved on Flecainide.  She is switched her care to me as she lives in this area.  Since I last saw her she was in the hospital for epistaxis.  She is feeling well.  She is not feeling any palpitations, presyncope or syncope.  She has no chest pressure, neck or arm discomfort.  She has had no weight gain or edema.  She has been followed for thrombocytopenia and leukopenia and has had laboratory workup per hematology and they were discussing a possible bone marrow biopsy.  She wanted to discuss whether this could be related to any of her cardiovascular medications.  Her hemoglobin has been mildly low.  White blood cells and platelets are falling and I reviewed her labs.  Past Medical History:  Diagnosis Date  . Anemia   . Arthritis   . Asthma   . CKD (chronic kidney disease), stage III ([place])   . Hyperlipidemia   . Hypertension   . Hypothyroidism   . Nodule of right lung    Right upper lobe  . Renal insufficiency    Chronic  . Renal vascular disease   . Right renal artery stenosis ([place]) 05/09/2015  . Vertigo     Past Surgical History:  Procedure Laterality Date  . IR GENERIC HISTORICAL  08/29/2016   IR US GUIDE VASC ACCESS RIGHT 08/29/2016 [facility]  . IR GENERIC HISTORICAL  08/29/2016   IR RENAL BILAT S&I MOD SED 08/29/2016 [facility]  . THYROIDECTOMY, PARTIAL Right 1981   middle lobe removed 1st, then right lobe removed 7-8 years later (approx. 1988)     Current Outpatient Medications  Medication Sig Dispense Refill  . aspirin EC 81 MG  tablet Take 81 mg by mouth See admin instructions. Take 1 tablet (81 mg) by mouth every other night    . atenolol-chlorthalidone (TENORETIC) 50-25 MG tablet TAKE 1 TABLET BY MOUTH DAILY. 90 tablet 1  . atorvastatin (LIPITOR) 20 MG tablet TAKE 1/2 TABLET DAILY 90 tablet 1  . calcium-vitamin D (OSCAL WITH D) 500-200 MG-UNIT per tablet Take 1 tablet by mouth at bedtime.     . cholecalciferol (VITAMIN D) 1000 units tablet Take 1,000 Units by mouth at bedtime.     . cloNIDine (CATAPRES) 0.1 MG tablet Take 0.1 mg by mouth See admin instructions. Take 1 tablet (0.1 mg) by mouth daily at bedtime - may also take 1 tablet (0.1 mg) during the day as needed for SBP >160  6  . ferrous sulfate ([employer]S IRON) 325 (65 FE) MG tablet Take 325 mg by mouth 2 (two) times daily with a meal.    . flecainide (TAMBOCOR) 50 MG tablet Take 1 tablet (50 mg total) by mouth 2 (two) times daily. 180 tablet 3  . fluticasone (FLONASE) 50 MCG/ACT nasal spray Place 1 spray into both nostrils 2 (two) times daily as needed for allergies or rhinitis.     . hydrALAZINE (APRESOLINE) 50 MG tablet Take 50 mg by mouth 2 (two)  times daily.     . hydroxypropyl methylcellulose / hypromellose (ISOPTO TEARS / GONIOVISC) 2.5 % ophthalmic solution Place 1 drop into both eyes daily.     Marland Kitchen levothyroxine (SYNTHROID, LEVOTHROID) 75 MCG tablet TAKE 1 TABLET (75 MCG TOTAL) BY MOUTH DAILY. 90 tablet 0  . lisinopril (PRINIVIL,ZESTRIL) 40 MG tablet Take 40 mg daily by mouth.    . mupirocin ointment (BACTROBAN) 2 % Use twice a day on affected areas 30 g 1  . Omega-3 Fatty Acids (FISH OIL) 1200 MG CAPS Take 1,200 mg by mouth daily.    . sodium chloride (OCEAN) 0.65 % SOLN nasal spray Place 1 spray into both nostrils 2 (two) times daily.      No current facility-administered medications for this visit.     Allergies:   Amlodipine; Isosorbide nitrate; Sulfonamide derivatives; and Terazosin    ROS:  Please see the history of present illness.   Otherwise,  review of systems are positive for none.   All other systems are reviewed and negative.    PHYSICAL EXAM: VS:  BP (!) 186/80   Pulse (!) 51   Ht '5\' 6"'  (1.676 m)   Wt 134 lb (60.8 kg)   BMI 21.63 kg/m  , BMI Body mass index is 21.63 kg/m.  GENERAL:  Well appearing NECK:  No jugular venous distention, waveform within normal limits, carotid upstroke brisk and symmetric, soft bilateral carotid bruits, no thyromegaly LUNGS:  Clear to auscultation bilaterally CHEST:  Unremarkable HEART:  PMI not displaced or sustained,S1 and S2 within normal limits, no S3, no S4, no clicks, no rubs, soft apical systolic bruit murmur nonradiating murmurs ABD:  Flat, positive bowel sounds normal in frequency in pitch, no bruits, no rebound, no guarding, no midline pulsatile mass, no hepatomegaly, no splenomegaly EXT:  2 plus pulses throughout, no edema, no cyanosis no clubbing   EKG:  EKG is  ordered today. The ekg ordered today demonstrates sinus rhythm, rate 51 , axis within normal limits, QTC is slightly prolonged,  no acute ST-T wave changes.  Incomplete right bundle branch block.   Recent Labs: 04/21/2017: ALT 9; BUN 24; Creatinine, Ser 1.33; Hemoglobin 11.4; Platelets 99; Potassium 3.9; Sodium 139     Wt Readings from Last 3 Encounters:  06/24/17 134 lb (60.8 kg)  05/12/17 131 lb (59.4 kg)  05/01/17 130 lb 12.8 oz (59.3 kg)      Other studies Reviewed: Additional studies/ records that were reviewed today include:  Labs Review of the above records demonstrates:     ASSESSMENT AND PLAN:  HFpEF -  She seems to be euvolemic.  No change in therapy is planned.   Left atrial tachycardia -    She is not having these symptoms.  See discussion below.  HTN -    Her blood pressure is elevated in the office but it always is.  It is well controlled at home in the 120s-30s.  No change in therapy is planned.   Carotid Stenosis -   This was less than 50% bilateral in August 2017.  I will arrange  follow-up.  Thrombocytopenia/leukopenia -  We had a long discussion about this.  She has falling platelets and white blood cell count.  I did look up her medications and this has been reported with flecainide.  He could also get leukopenia and anemia with hydralazine.  However, this is reported less than 1% of the time with flecainide.  She canceled follow-up with hematology and possible bone marrow biopsy  because she wanted to know if coming off flecainide would resolve this issue.  I certainly could not guarantee this.  I have not seen this reaction before.  I will consult with Dr. Caryl Comes who prescribed the medication.  However, I strongly encouraged her to follow-up with hematology to look for alternative causes.  We could certainly try her off the flecainide if that was there is suggestion understanding that she would likely have recurrent tachypalpitations.    Current medicines are reviewed at length with the patient today.  The patient does not have concerns regarding medicines.  The following changes have been made:   None  Labs/ tests ordered today include: None  Orders Placed This Encounter  Procedures  . US Carotid Duplex Bilateral  . EKG 12-Lead     Disposition:   FU with me in six months.     Signed, Minus Breeding, MD  06/24/2017 12:38 PM    [facility]

## 2017-06-24 ENCOUNTER — Encounter: Payer: Self-pay | Admitting: Cardiology

## 2017-06-24 ENCOUNTER — Ambulatory Visit: Payer: PPO | Admitting: Cardiology

## 2017-06-24 VITALS — BP 186/80 | HR 51 | Ht 66.0 in | Wt 134.0 lb

## 2017-06-24 DIAGNOSIS — I6523 Occlusion and stenosis of bilateral carotid arteries: Secondary | ICD-10-CM | POA: Diagnosis not present

## 2017-06-24 DIAGNOSIS — I5032 Chronic diastolic (congestive) heart failure: Secondary | ICD-10-CM

## 2017-06-24 DIAGNOSIS — D696 Thrombocytopenia, unspecified: Secondary | ICD-10-CM

## 2017-06-24 DIAGNOSIS — I10 Essential (primary) hypertension: Secondary | ICD-10-CM | POA: Diagnosis not present

## 2017-06-24 NOTE — Patient Instructions (Signed)
Medication Instructions:  The current medical regimen is effective;  continue present plan and medications.  Testing/Procedures: Your physician has requested that you have a carotid duplex. This test is an ultrasound of the carotid arteries in your neck. It looks at blood flow through these arteries that supply the brain with blood. Allow one hour for this exam. There are no restrictions or special instructions.  You will be contacted to schedule this appointment.  If you are not please call [phone] to schedule.  Follow-Up: Follow up in 6 months with Dr. Percival Spanish.  You will receive a letter in the mail 2 months before you are due.  Please call us when you receive this letter to schedule your follow up appointment.  If you need a refill on your cardiac medications before your next appointment, please call your pharmacy.  Thank you for choosing [facility]!!

## 2017-07-06 ENCOUNTER — Ambulatory Visit (HOSPITAL_COMMUNITY)
Admission: RE | Admit: 2017-07-06 | Discharge: 2017-07-06 | Disposition: A | Discharge status: Home / Self Care | Payer: PPO | Source: Ambulatory Visit | Attending: Cardiology | Admitting: Cardiology

## 2017-07-06 DIAGNOSIS — I6523 Occlusion and stenosis of bilateral carotid arteries: Secondary | ICD-10-CM | POA: Diagnosis not present

## 2017-07-06 DIAGNOSIS — I10 Essential (primary) hypertension: Secondary | ICD-10-CM | POA: Insufficient documentation

## 2017-07-06 DIAGNOSIS — E785 Hyperlipidemia, unspecified: Secondary | ICD-10-CM | POA: Diagnosis not present

## 2017-07-12 ENCOUNTER — Other Ambulatory Visit: Payer: Self-pay | Admitting: Cardiology

## 2017-07-14 ENCOUNTER — Other Ambulatory Visit: Payer: Self-pay | Admitting: Family Medicine

## 2017-07-15 ENCOUNTER — Other Ambulatory Visit: Payer: Self-pay | Admitting: Family Medicine

## 2017-07-17 ENCOUNTER — Ambulatory Visit (HOSPITAL_COMMUNITY): Payer: PPO

## 2017-07-31 ENCOUNTER — Other Ambulatory Visit: Payer: Self-pay | Admitting: Family Medicine

## 2017-08-03 ENCOUNTER — Ambulatory Visit: Payer: PPO | Admitting: Nurse Practitioner

## 2017-08-03 ENCOUNTER — Encounter: Payer: Self-pay | Admitting: Nurse Practitioner

## 2017-08-03 VITALS — BP 148/85 | HR 78 | Temp 97.7°F | Ht 66.0 in | Wt 129.0 lb

## 2017-08-03 DIAGNOSIS — S61411A Laceration without foreign body of right hand, initial encounter: Secondary | ICD-10-CM

## 2017-08-03 DIAGNOSIS — Z23 Encounter for immunization: Secondary | ICD-10-CM

## 2017-08-03 NOTE — Patient Instructions (Signed)
Sterile Tape Wound Care Some cuts and wounds can be closed using sterile tape, also called skin adhesive strips. Skin adhesive strips can be used for shallow (superficial) and simple cuts, wounds, lacerations, and some surgical incisions. These strips act in place of stitches, or in addition to stitches, to hold the edges of the wound together to allow for better healing. Unlike stitches, the adhesive strips do not require needles or anesthetic medicine for placement. The strips usually fall off on their own as the wound is healing. It is important to take proper care of your wound at home while it heals. How to care for a sterile tape wound  Try to keep the area around your wound clean and dry. Do not allow the adhesive strips to get wet for the first 12 hours.  Do not use any soaps or ointments on the wound for the first 12 hours.  If a bandage (dressing) has been applied, keep it dry.  Follow instructions from your health care provider about how often to change the dressing. ? Wash your hands with soap and water before you change your dressing. If soap and water are not available, use hand sanitizer. ? Change your dressing as told by your health care provider. ? Leave adhesive strips in place. These skin closures may need to stay in place for 2 weeks or longer. If adhesive strip edges start to loosen and curl up, you may trim the loose edges. Do not remove adhesive strips completely unless your health care provider tells you to do that.  Do not scratch, rub, or pick at the wound area.  Protect the wound from further injury until it is healed.  Protect the wound from sun and tanning bed exposure while it is healing, and for several weeks after healing.  Check the wound every day for signs of infection. Check for: ? More redness, swelling, or pain. ? More fluid or blood. ? Warmth. ? Pus or a bad smell. Follow these instructions at home:  Take over-the-counter and prescription medicines  only as told by your health care provider.  Keep all follow-up visits as told by your health care provider. This is important. Contact a health care provider if:  Your adhesive strips become soaked with blood or fall off before the wound has healed. The tape will need to be replaced.  You have a fever. Get help right away if:  You have chills.  You develop a rash after the strips are applied.  You have a red streak that goes away from the wound.  You have more redness, swelling, or pain around your wound.  You have more fluid or blood coming from your wound.  Your wound feels warm to the touch.  You have pus or a bad smell coming from your wound.  Your wound breaks open. This information is not intended to replace advice given to you by your health care provider. Make sure you discuss any questions you have with your health care provider. Document Released: 09/11/2004 Document Revised: 06/27/2016 Document Reviewed: 06/27/2016 Elsevier Interactive Patient Education  2017 [employer].

## 2017-08-03 NOTE — Addendum Note (Signed)
Addended by: Rolena Infante on: 08/03/2017 04:54 PM   Modules accepted: Orders

## 2017-08-03 NOTE — Progress Notes (Signed)
   Subjective:    Patient ID: [name], female    DOB: 05/26/1934, 83 y.o.   MRN: [id number]  HPI Patient cut her right hand on refrigerator drawer yesterday afternoon. She had steristrips at home and used them but the area will not quit bleeding.    Review of Systems  Constitutional: Negative.   Respiratory: Negative.   Cardiovascular: Negative.   Skin: Positive for wound (right hand).  Neurological: Negative.   Psychiatric/Behavioral: Negative.   All other systems reviewed and are negative.      Objective:   Physical Exam  Constitutional: She appears well-developed and well-nourished.  Skin: Skin is warm.  4cm rounded shin tear to right hand just below thumb. Steri-stirps from home removed- bleeding stopped with silver nitrate stick. Benzoin and new steri-strips applied. Gauze applied.    BP (!) 148/85   Pulse 78   Temp 97.7 F (36.5 C) (Oral)   Ht 5\' 6"  (1.676 m)   Wt 129 lb (58.5 kg)   BMI 20.82 kg/m         Assessment & Plan:   1. Laceration of right hand without foreign body, initial encounter    Tetanus today Keep clean and dry Steri stirps will fall off- do not pick at them RTO prn  [place], FNP

## 2017-08-18 DIAGNOSIS — C50919 Malignant neoplasm of unspecified site of unspecified female breast: Secondary | ICD-10-CM

## 2017-08-18 HISTORY — DX: Malignant neoplasm of unspecified site of unspecified female breast: C50.919

## 2017-08-31 ENCOUNTER — Ambulatory Visit (INDEPENDENT_AMBULATORY_CARE_PROVIDER_SITE_OTHER): Payer: PPO | Admitting: Family Medicine

## 2017-08-31 ENCOUNTER — Encounter: Payer: Self-pay | Admitting: Family Medicine

## 2017-08-31 VITALS — BP 187/66 | HR 59 | Temp 97.9°F | Ht 66.0 in | Wt 131.0 lb

## 2017-08-31 DIAGNOSIS — R300 Dysuria: Secondary | ICD-10-CM | POA: Diagnosis not present

## 2017-08-31 LAB — URINALYSIS, COMPLETE
Bilirubin, UA: NEGATIVE
GLUCOSE, UA: NEGATIVE
Ketones, UA: NEGATIVE
Leukocytes, UA: NEGATIVE
Nitrite, UA: NEGATIVE
PH UA: 8.5 — AB (ref 5.0–7.5)
PROTEIN UA: NEGATIVE
RBC, UA: NEGATIVE
Specific Gravity, UA: 1.015 (ref 1.005–1.030)
Urobilinogen, Ur: 0.2 mg/dL (ref 0.2–1.0)

## 2017-08-31 LAB — MICROSCOPIC EXAMINATION
Bacteria, UA: NONE SEEN
RBC MICROSCOPIC, UA: NONE SEEN /HPF (ref 0–?)
RENAL EPITHEL UA: NONE SEEN /HPF
WBC UA: NONE SEEN /HPF (ref 0–?)

## 2017-08-31 MED ORDER — CIPROFLOXACIN HCL 500 MG PO TABS: 500.0000 mg | ORAL_TABLET | Freq: Two times a day (BID) | ORAL | 0 refills | Status: DC

## 2017-08-31 NOTE — Progress Notes (Signed)
   BP (!) 204/70   Pulse (!) 59   Temp 97.9 F (36.6 C) (Oral)   Ht 5\' 6"  (1.676 m)   Wt 131 lb (59.4 kg)   BMI 21.14 kg/m    Subjective:    Patient ID: [name], female    DOB: 1934/08/11, 84 y.o.   MRN: [id number]  HPI: [name] is a 84 y.o. female presenting on 08/31/2017 for Low back and lower abdomen pain (back pain began one week ago, abdominal pain began friday)   HPI Low back pain and lower abdominal pain Low back pain and lower abdominal pain  Relevant past medical, surgical, family and social history reviewed and updated as indicated. Interim medical history since our last visit reviewed. Allergies and medications reviewed and updated.  Review of Systems  Constitutional: Negative for chills and fever.  Eyes: Negative for visual disturbance.  Respiratory: Negative for chest tightness and shortness of breath.   Cardiovascular: Negative for chest pain and leg swelling.  Gastrointestinal: Positive for abdominal pain.  Genitourinary: Positive for flank pain. Negative for difficulty urinating, dysuria and frequency.  Musculoskeletal: Negative for back pain and gait problem.  Skin: Negative for rash.  Neurological: Negative for light-headedness and headaches.  Psychiatric/Behavioral: Negative for agitation and behavioral problems.  All other systems reviewed and are negative.   Per HPI unless specifically indicated above     Objective:    BP (!) 204/70   Pulse (!) 59   Temp 97.9 F (36.6 C) (Oral)   Ht 5\' 6"  (1.676 m)   Wt 131 lb (59.4 kg)   BMI 21.14 kg/m   Wt Readings from Last 3 Encounters:  08/31/17 131 lb (59.4 kg)  08/03/17 129 lb (58.5 kg)  06/24/17 134 lb (60.8 kg)    Physical Exam  Constitutional: She is oriented to person, place, and time. She appears well-developed and well-nourished. No distress.  Eyes: Conjunctivae are normal.  Cardiovascular: Normal rate, regular rhythm and intact distal pulses.  Murmur (Holosystolic murmur grade  4 out of 6) heard. Pulmonary/Chest: Effort normal and breath sounds normal. No respiratory distress. She has no wheezes.  Abdominal: Soft. Bowel sounds are normal. She exhibits no distension. There is no tenderness. There is no rebound and no guarding.  Musculoskeletal: Normal range of motion.  Neurological: She is alert and oriented to person, place, and time. Coordination normal.  Skin: Skin is warm and dry. No rash noted. She is not diaphoretic.  Psychiatric: She has a normal mood and affect. Her behavior is normal.  Nursing note and vitals reviewed.   Urinalysis: 0-10 epithelial cells, otherwise negative    Assessment & Plan:   Problem List Items Addressed This Visit    None    Visit Diagnoses    Dysuria    -  Primary   Relevant Orders   Urinalysis, Complete   Urine Culture      Sent Keflex 500 twice daily for her, called into pharmacy  Urinalysis did not show overt infection, will run culture, sent antibiotic based off symptoms but also recommended for her to go back and see her kidney doctor because of her history of needing stents.  Follow up plan: Return if symptoms worsen or fail to improve.  Counseling provided for all of the vaccine components Orders Placed This Encounter  Procedures  . Urine Culture  . Urinalysis, Complete    Caryl Pina, MD [facility] 08/31/2017, 10:34 AM

## 2017-09-01 LAB — URINE CULTURE: Organism ID, Bacteria: NO GROWTH

## 2017-09-02 DIAGNOSIS — N2889 Other specified disorders of kidney and ureter: Secondary | ICD-10-CM | POA: Diagnosis not present

## 2017-09-02 DIAGNOSIS — N183 Chronic kidney disease, stage 3 (moderate): Secondary | ICD-10-CM | POA: Diagnosis not present

## 2017-09-02 DIAGNOSIS — D649 Anemia, unspecified: Secondary | ICD-10-CM | POA: Diagnosis not present

## 2017-09-02 DIAGNOSIS — I129 Hypertensive chronic kidney disease with stage 1 through stage 4 chronic kidney disease, or unspecified chronic kidney disease: Secondary | ICD-10-CM | POA: Diagnosis not present

## 2017-09-03 DIAGNOSIS — R102 Pelvic and perineal pain: Secondary | ICD-10-CM | POA: Diagnosis not present

## 2017-09-07 ENCOUNTER — Other Ambulatory Visit: Payer: PPO

## 2017-09-07 DIAGNOSIS — I480 Paroxysmal atrial fibrillation: Secondary | ICD-10-CM

## 2017-09-07 DIAGNOSIS — E7800 Pure hypercholesterolemia, unspecified: Secondary | ICD-10-CM | POA: Diagnosis not present

## 2017-09-07 DIAGNOSIS — I10 Essential (primary) hypertension: Secondary | ICD-10-CM | POA: Diagnosis not present

## 2017-09-07 DIAGNOSIS — N183 Chronic kidney disease, stage 3 unspecified: Secondary | ICD-10-CM

## 2017-09-07 DIAGNOSIS — E559 Vitamin D deficiency, unspecified: Secondary | ICD-10-CM | POA: Diagnosis not present

## 2017-09-08 DIAGNOSIS — R1032 Left lower quadrant pain: Secondary | ICD-10-CM | POA: Diagnosis not present

## 2017-09-08 LAB — BMP8+EGFR
BUN / CREAT RATIO: 12 (ref 12–28)
BUN: 16 mg/dL (ref 8–27)
CALCIUM: 9.2 mg/dL (ref 8.7–10.3)
CO2: 29 mmol/L (ref 20–29)
CREATININE: 1.37 mg/dL — AB (ref 0.57–1.00)
Chloride: 93 mmol/L — ABNORMAL LOW (ref 96–106)
GFR, EST AFRICAN AMERICAN: 42 mL/min/{1.73_m2} — AB (ref >59)
GFR, EST NON AFRICAN AMERICAN: 36 mL/min/{1.73_m2} — AB (ref >59)
Glucose: 119 mg/dL — ABNORMAL HIGH (ref 65–99)
Potassium: 3.8 mmol/L (ref 3.5–5.2)
Sodium: 137 mmol/L (ref 134–144)

## 2017-09-08 LAB — CBC WITH DIFFERENTIAL/PLATELET
Basophils Absolute: 0 10*3/uL (ref 0.0–0.2)
Basos: 0 %
EOS (ABSOLUTE): 0 10*3/uL (ref 0.0–0.4)
EOS: 0 %
HEMATOCRIT: 39 % (ref 34.0–46.6)
Hemoglobin: 13.1 g/dL (ref 11.1–15.9)
IMMATURE GRANS (ABS): 0 10*3/uL (ref 0.0–0.1)
IMMATURE GRANULOCYTES: 0 %
LYMPHS: 36 %
Lymphocytes Absolute: 1.2 10*3/uL (ref 0.7–3.1)
MCH: 29.4 pg (ref 26.6–33.0)
MCHC: 33.6 g/dL (ref 31.5–35.7)
MCV: 87 fL (ref 79–97)
MONOS ABS: 0.4 10*3/uL (ref 0.1–0.9)
Monocytes: 14 %
NEUTROS ABS: 1.6 10*3/uL (ref 1.4–7.0)
NEUTROS PCT: 50 %
Platelets: 135 10*3/uL — ABNORMAL LOW (ref 150–379)
RBC: 4.46 x10E6/uL (ref 3.77–5.28)
RDW: 14.1 % (ref 12.3–15.4)
WBC: 3.2 10*3/uL — ABNORMAL LOW (ref 3.4–10.8)

## 2017-09-08 LAB — HEPATIC FUNCTION PANEL
ALT: 15 IU/L (ref 0–32)
AST: 21 IU/L (ref 0–40)
Albumin: 4.7 g/dL (ref 3.5–4.7)
Alkaline Phosphatase: 67 IU/L (ref 39–117)
Bilirubin Total: 0.6 mg/dL (ref 0.0–1.2)
Bilirubin, Direct: 0.19 mg/dL (ref 0.00–0.40)
Total Protein: 7.4 g/dL (ref 6.0–8.5)

## 2017-09-08 LAB — LIPID PANEL
CHOL/HDL RATIO: 2.6 ratio (ref 0.0–4.4)
Cholesterol, Total: 141 mg/dL (ref 100–199)
HDL: 55 mg/dL (ref >39)
LDL CALC: 64 mg/dL (ref 0–99)
Triglycerides: 111 mg/dL (ref 0–149)
VLDL CHOLESTEROL CAL: 22 mg/dL (ref 5–40)

## 2017-09-08 LAB — VITAMIN D 25 HYDROXY (VIT D DEFICIENCY, FRACTURES): Vit D, 25-Hydroxy: 58.6 ng/mL (ref 30.0–100.0)

## 2017-09-09 ENCOUNTER — Other Ambulatory Visit: Payer: Self-pay | Admitting: Nephrology

## 2017-09-09 DIAGNOSIS — N183 Chronic kidney disease, stage 3 unspecified: Secondary | ICD-10-CM

## 2017-09-09 DIAGNOSIS — I10 Essential (primary) hypertension: Secondary | ICD-10-CM

## 2017-09-09 DIAGNOSIS — N2889 Other specified disorders of kidney and ureter: Secondary | ICD-10-CM

## 2017-09-15 ENCOUNTER — Encounter: Payer: Self-pay | Admitting: Family Medicine

## 2017-09-15 ENCOUNTER — Ambulatory Visit (INDEPENDENT_AMBULATORY_CARE_PROVIDER_SITE_OTHER): Payer: PPO | Admitting: Family Medicine

## 2017-09-15 VITALS — BP 161/65 | HR 56 | Temp 98.4°F | Ht 66.0 in | Wt 129.0 lb

## 2017-09-15 DIAGNOSIS — I480 Paroxysmal atrial fibrillation: Secondary | ICD-10-CM | POA: Diagnosis not present

## 2017-09-15 DIAGNOSIS — N2889 Other specified disorders of kidney and ureter: Secondary | ICD-10-CM

## 2017-09-15 DIAGNOSIS — D696 Thrombocytopenia, unspecified: Secondary | ICD-10-CM | POA: Diagnosis not present

## 2017-09-15 DIAGNOSIS — D61818 Other pancytopenia: Secondary | ICD-10-CM

## 2017-09-15 DIAGNOSIS — N183 Chronic kidney disease, stage 3 unspecified: Secondary | ICD-10-CM

## 2017-09-15 DIAGNOSIS — I5032 Chronic diastolic (congestive) heart failure: Secondary | ICD-10-CM

## 2017-09-15 DIAGNOSIS — I10 Essential (primary) hypertension: Secondary | ICD-10-CM

## 2017-09-15 DIAGNOSIS — E559 Vitamin D deficiency, unspecified: Secondary | ICD-10-CM

## 2017-09-15 DIAGNOSIS — E7800 Pure hypercholesterolemia, unspecified: Secondary | ICD-10-CM | POA: Diagnosis not present

## 2017-09-15 NOTE — Progress Notes (Signed)
Subjective:    Patient ID: [name], female    DOB: Nov 15, 1933, 84 y.o.   MRN: [id number]  HPI  Pt here for follow up and management of chronic medical problems which includes a fib, hypertension and hyperlipidemia. He is taking medication regularly.  The patient is doing well today only has a slight cough as a complaint.  Her blood pressure readings should continue to be up and down.  Today it was 134/57 but sometimes it may be as high as 190 over the 70.  She is followed regularly by the nephrologist.  The initial blood pressure reading in our office today was 161/65.  The readings that she brings in today will be scanned into the record.  We will give her the original copy to take for other providers to see.  She is followed regularly by the cardiologist and the nephrologist.  The patient has had lab work done and this will be reviewed with her during the visit today.  All of her cholesterol numbers were excellent with an LDL C being 64.  White blood cell count was slightly decreased but not as much as in the past and we will continue to monitor this.  The hemoglobin was good at 13.1.  The platelet count also remains decreased but higher than previously.  The blood sugar was slightly elevated at 119 and the creatinine remains elevated at 1.37 with a GFR of 36.  The electrolytes were good including the potassium except the chloride was slightly decreased.  The vitamin D level was good at 8.6.  All liver function tests were normal.  The patient is pleasant and relaxed.  She denies any chest pain or shortness of breath.  She denies any problems with her intestinal tract including nausea vomiting diarrhea blood in the stool or black tarry bowel movements.  She was taking some extra clonidine on an as-needed basis for her blood pressure control and this did cause some constipation.  She has increased her hydralazine in it and has not had to take as much clonidine after talking with the nephrologist.  She  is passing her water well without any particular problems.     Patient Active Problem List   Diagnosis Date Noted  . Hematoma 11/27/2016  . Severe epistaxis 11/23/2016  . Vertigo 09/09/2016  . Renal vascular disease 10/31/2015  . Nodule of right lung 10/31/2015  . Asthma without status asthmaticus 10/31/2015  . Essential hypertension, malignant   . Hypothyroidism 10/15/2015  . Pancytopenia ([place]) 10/15/2015  . Hyponatremia 10/14/2015  . Thrombocytopenia ([place]) 06/16/2015  . Right renal artery stenosis ([place]) 05/09/2015  . Chronic diastolic heart failure ([place]) 04/03/2014  . Acute diastolic heart failure ([place]) 04/03/2014  . Hypertensive urgency 04/02/2014  . Headache 04/02/2014  . CKD (chronic kidney disease) stage 3, GFR 30-59 ml/min (HCC) 04/02/2014  . Paroxysmal atrial fibrillation ([place]) 01/25/2014  . Hypokalemia 07/19/2013  . Anemia 07/19/2013  . Bilateral lower extremity edema 06/30/2013  . Hypertension with fluid overload 06/30/2013  . Need for prophylactic vaccination and inoculation against influenza 05/31/2013  . Pedal edema 05/10/2013  . Varicose veins of lower extremities with other complications 65/46/5035  . ABNORMAL STRESS ELECTROCARDIOGRAM 01/23/2010  . Hyperlipidemia 12/18/2009  . Essential hypertension 12/18/2009  . RENAL INSUFFICIENCY, CHRONIC 12/18/2009  . ARTHRITIS 12/18/2009   Outpatient Encounter Medications as of 09/15/2017  Medication Sig  . aspirin EC 81 MG tablet Take 81 mg by mouth See admin instructions. Take 1 tablet (81 mg)  by mouth every other night  . atenolol-chlorthalidone (TENORETIC) 50-25 MG tablet TAKE 1 TABLET BY MOUTH DAILY.  Marland Kitchen atorvastatin (LIPITOR) 20 MG tablet TAKE 1/2 TABLET DAILY  . calcium-vitamin D (OSCAL WITH D) 500-200 MG-UNIT per tablet Take 1 tablet by mouth at bedtime.   . cholecalciferol (VITAMIN D) 1000 units tablet Take 1,000 Units by mouth at bedtime.   . cloNIDine (CATAPRES) 0.1 MG tablet Take 0.1 mg by mouth See admin  instructions. Take 1 tablet (0.1 mg) by mouth daily at bedtime - may also take 1 tablet (0.1 mg) during the day as needed for SBP >160  . flecainide (TAMBOCOR) 50 MG tablet TAKE 1 TABLET (50 MG TOTAL) BY MOUTH 2 (TWO) TIMES DAILY.  . fluticasone (FLONASE) 50 MCG/ACT nasal spray Place 1 spray into both nostrils 2 (two) times daily as needed for allergies or rhinitis.   . hydrALAZINE (APRESOLINE) 50 MG tablet Take 100 mg by mouth 2 (two) times daily.   . hydroxypropyl methylcellulose / hypromellose (ISOPTO TEARS / GONIOVISC) 2.5 % ophthalmic solution Place 1 drop into both eyes daily.   Marland Kitchen levothyroxine (SYNTHROID, LEVOTHROID) 75 MCG tablet TAKE 1 TABLET (75 MCG TOTAL) BY MOUTH DAILY.  Marland Kitchen lisinopril (PRINIVIL,ZESTRIL) 40 MG tablet TAKE 1 TABLET BY MOUTH TWICE A DAY (Patient taking differently: TAKE 1 TABLET po daily)  . mupirocin ointment (BACTROBAN) 2 % Use twice a day on affected areas  . Omega-3 Fatty Acids (FISH OIL) 1200 MG CAPS Take 1,200 mg by mouth daily.  . sodium chloride (OCEAN) 0.65 % SOLN nasal spray Place 1 spray into both nostrils 2 (two) times daily.    No facility-administered encounter medications on file as of 09/15/2017.       Review of Systems  Constitutional: Negative.   HENT: Negative.   Eyes: Negative.   Respiratory: Positive for cough (slight ).   Cardiovascular: Negative.   Gastrointestinal: Negative.   Endocrine: Negative.   Genitourinary: Negative.   Musculoskeletal: Negative.   Skin: Negative.   Allergic/Immunologic: Negative.   Neurological: Negative.   Hematological: Negative.   Psychiatric/Behavioral: Negative.        Objective:   Physical Exam  Constitutional: She is oriented to person, place, and time. She appears well-developed and well-nourished.  The patient is pleasant and relaxed and in very good spirits.  She continues to be followed regularly by her new nephrologist, Dr. Posey Pronto.  He has an ultrasound of her kidneys plan for soon with a return  appointment for follow-up.  HENT:  Head: Normocephalic and atraumatic.  Right Ear: External ear normal.  Left Ear: External ear normal.  Nose: Nose normal.  Mouth/Throat: Oropharynx is clear and moist. No oropharyngeal exudate.  Eyes: Conjunctivae and EOM are normal. Pupils are equal, round, and reactive to light. Right eye exhibits no discharge. Left eye exhibits no discharge. No scleral icterus.  Neck: Normal range of motion. Neck supple. No thyromegaly present.  No bruits thyromegaly or anterior cervical adenopathy  Cardiovascular: Normal rate, regular rhythm and intact distal pulses. Exam reveals no gallop and no friction rub.  Murmur heard. Grade 2/6 systolic ejection murmur and patient is continue to be followed by the cardiologist.  Pulmonary/Chest: Effort normal and breath sounds normal. No respiratory distress. She has no wheezes. She has no rales.  Clear anteriorly and posteriorly  Abdominal: Soft. Bowel sounds are normal. She exhibits no mass. There is no tenderness. There is no rebound and no guarding.  No liver or spleen enlargement no bruits and  no masses  Musculoskeletal: Normal range of motion. She exhibits no edema.  Lymphadenopathy:    She has no cervical adenopathy.  Neurological: She is alert and oriented to person, place, and time. She has normal reflexes. No cranial nerve deficit.  Skin: Skin is warm and dry. No rash noted.  Dry skin in general and patient is encouraged to use scent free soaps fabric softeners and detergents  Psychiatric: She has a normal mood and affect. Her behavior is normal. Judgment and thought content normal.  Nursing note and vitals reviewed.   BP (!) 161/65 (BP Location: Left Arm)   Pulse (!) 56   Temp 98.4 F (36.9 C) (Oral)   Ht 5\' 6"  (1.676 m)   Wt 129 lb (58.5 kg)   BMI 20.82 kg/m        Assessment & Plan:  1. Pure hypercholesterolemia -Cholesterol numbers were good and she will continue with current treatment  2.  Paroxysmal atrial fibrillation (HCC) -The patient was in normal sinus rhythm today at 60/min  3. Vitamin D deficiency -Continue with vitamin D replacement and discuss this with nephrologist  4. Essential hypertension -Blood pressure was elevated today but patient does have some home readings that are good and no change in treatment will be undertaken.  5. Thrombocytopenia (HCC) -Platelet count remains decreased but improved from previously  6. Renal vascular disease -The creatinine remains elevated but stable and when compared to previous readings  7. Pancytopenia ([place])  8. Chronic diastolic heart failure (HCC) -This appears to be stable and patient is having no problems with shortness of breath.  9.  Chronic kidney disease stage III -Continue follow-up with nephrologist  Patient Instructions                       Medicare Annual Wellness Visit  [employer] and the medical providers at [facility] strive to bring you the best medical care.  In doing so we not only want to address your current medical conditions and concerns but also to detect new conditions early and prevent illness, disease and health-related problems.    Medicare offers a yearly Wellness Visit which allows our clinical staff to assess your need for preventative services including immunizations, lifestyle education, counseling to decrease risk of preventable diseases and screening for fall risk and other medical concerns.    This visit is provided free of charge (no copay) for all Medicare recipients. The clinical pharmacists at [facility] have begun to conduct these Wellness Visits which will also include a thorough review of all your medications.    As you primary medical provider recommend that you make an appointment for your Annual Wellness Visit if you have not done so already this year.  You may set up this appointment before you leave today or you may call back  [phone]) and schedule an appointment.  Please make sure when you call that you mention that you are scheduling your Annual Wellness Visit with the clinical pharmacist so that the appointment may be made for the proper length of time.     Continue current medications. Continue good therapeutic lifestyle changes which include good diet and exercise. Fall precautions discussed with patient. If an FOBT was given today- please return it to our front desk. If you are over 47 years old - you may need Prevnar 63 or the adult Pneumonia vaccine.  **Flu shots are available--- please call and schedule a FLU-CLINIC appointment**  After your visit with [place] today you will receive a survey in the mail or online from [employer] regarding your care with [place]. Please take a moment to fill this out. Your feedback is very important to [place] as you can help [place] better understand your patient needs as well as improve your experience and satisfaction. WE CARE ABOUT YOU!!!   Continue to follow-up regularly with cardiology and with nephrology. Continue to drink plenty of fluids and stay well-hydrated Exercise regularly Monitor blood pressures periodically and record these so that these readings can be shared with the specialist that you see. Take Mucinex plain, blue and white in color and take 1 twice daily with a large glass of water Use cool mist humidification For dry skin, use fabric softener soaps and detergents that are scent free    Arrie Senate MD

## 2017-09-15 NOTE — Patient Instructions (Addendum)
Medicare Annual Wellness Visit  [facility] and the medical providers at [facility] strive to bring you the best medical care.  In doing so we not only want to address your current medical conditions and concerns but also to detect new conditions early and prevent illness, disease and health-related problems.    Medicare offers a yearly Wellness Visit which allows our clinical staff to assess your need for preventative services including immunizations, lifestyle education, counseling to decrease risk of preventable diseases and screening for fall risk and other medical concerns.    This visit is provided free of charge (no copay) for all Medicare recipients. The clinical pharmacists at [facility] have begun to conduct these Wellness Visits which will also include a thorough review of all your medications.    As you primary medical provider recommend that you make an appointment for your Annual Wellness Visit if you have not done so already this year.  You may set up this appointment before you leave today or you may call back ([phone]) and schedule an appointment.  Please make sure when you call that you mention that you are scheduling your Annual Wellness Visit with the clinical pharmacist so that the appointment may be made for the proper length of time.     Continue current medications. Continue good therapeutic lifestyle changes which include good diet and exercise. Fall precautions discussed with patient. If an FOBT was given today- please return it to our front desk. If you are over 10 years old - you may need Prevnar 96 or the adult Pneumonia vaccine.  **Flu shots are available--- please call and schedule a FLU-CLINIC appointment**  After your visit with [place] today you will receive a survey in the mail or online from [employer] regarding your care with [place]. Please take a moment to fill this out. Your feedback is very  important to [place] as you can help [place] better understand your patient needs as well as improve your experience and satisfaction. WE CARE ABOUT YOU!!!   Continue to follow-up regularly with cardiology and with nephrology. Continue to drink plenty of fluids and stay well-hydrated Exercise regularly Monitor blood pressures periodically and record these so that these readings can be shared with the specialist that you see. Take Mucinex plain, blue and white in color and take 1 twice daily with a large glass of water Use cool mist humidification For dry skin, use fabric softener soaps and detergents that are scent free

## 2017-09-18 ENCOUNTER — Ambulatory Visit
Admission: RE | Admit: 2017-09-18 | Discharge: 2017-09-18 | Disposition: A | Discharge status: Home / Self Care | Payer: PPO | Source: Ambulatory Visit | Attending: Nephrology | Admitting: Nephrology

## 2017-09-18 ENCOUNTER — Encounter (INDEPENDENT_AMBULATORY_CARE_PROVIDER_SITE_OTHER): Payer: Self-pay

## 2017-09-18 DIAGNOSIS — N183 Chronic kidney disease, stage 3 unspecified: Secondary | ICD-10-CM

## 2017-09-18 DIAGNOSIS — N189 Chronic kidney disease, unspecified: Secondary | ICD-10-CM | POA: Diagnosis not present

## 2017-09-18 DIAGNOSIS — I10 Essential (primary) hypertension: Secondary | ICD-10-CM

## 2017-09-18 DIAGNOSIS — N2889 Other specified disorders of kidney and ureter: Secondary | ICD-10-CM

## 2017-10-07 ENCOUNTER — Encounter: Payer: Self-pay | Admitting: Family Medicine

## 2017-10-07 ENCOUNTER — Ambulatory Visit (INDEPENDENT_AMBULATORY_CARE_PROVIDER_SITE_OTHER): Payer: PPO

## 2017-10-07 ENCOUNTER — Ambulatory Visit (INDEPENDENT_AMBULATORY_CARE_PROVIDER_SITE_OTHER): Payer: PPO | Admitting: Family Medicine

## 2017-10-07 VITALS — BP 149/54 | HR 54 | Temp 96.9°F | Ht 66.0 in | Wt 132.0 lb

## 2017-10-07 DIAGNOSIS — S8991XA Unspecified injury of right lower leg, initial encounter: Secondary | ICD-10-CM | POA: Diagnosis not present

## 2017-10-07 DIAGNOSIS — I10 Essential (primary) hypertension: Secondary | ICD-10-CM

## 2017-10-07 DIAGNOSIS — M129 Arthropathy, unspecified: Secondary | ICD-10-CM | POA: Diagnosis not present

## 2017-10-07 DIAGNOSIS — M25461 Effusion, right knee: Secondary | ICD-10-CM | POA: Diagnosis not present

## 2017-10-07 DIAGNOSIS — M25561 Pain in right knee: Secondary | ICD-10-CM | POA: Diagnosis not present

## 2017-10-07 NOTE — Progress Notes (Signed)
Subjective:  Patient ID: [name], female    DOB: 1933-08-31  Age: 84 y.o. MRN: [id number]  CC: Knee Pain (pt here today c/o right knee pain especially when walking after twisting it last week)   HPI [name] presents for right knee pain and swelling for a week.  Injury occurred when she was sitting down.  She spilled something on the floor.  She wiped it up by putting a bag on the floor and running it across the spill with her right leg using a back and forth motion.  Since that time she has experienced pain and swelling in the knee.  Pain is 5-6/10.  It is a dull ache.  It is prominent along the anterior joint line.  It has responded some to using ice.  She says her skin is very thin and probed bruising so she is avoiding Advil Tylenol etc. also she notes that her blood pressure although high here is being followed by her kidney specialist and it is usually only high when she is here.  She checks it at home and it usually in the 120s.  Occasionally it will hit the 140-145 level.  Depression screen [facility] 2/9 10/07/2017 09/15/2017 08/31/2017  Decreased Interest 0 0 0  Down, Depressed, Hopeless 0 0 0  PHQ - 2 Score 0 0 0  Some recent data might be hidden    History Kimberla has a past medical history of Anemia, Arthritis, Asthma, CKD (chronic kidney disease), stage III ([place]), Hyperlipidemia, Hypertension, Hypothyroidism, Nodule of right lung, Renal insufficiency, Renal vascular disease, Right renal artery stenosis ([place]) (05/09/2015), and Vertigo.   She has a past surgical history that includes Thyroidectomy, partial (Right, 1981); ir generic historical (08/29/2016); and ir generic historical (08/29/2016).   Her family history includes Cancer in her maternal grandmother; Hypercholesterolemia in her daughter and daughter; Hypertension in her brother and daughter; Lung cancer in her maternal uncle and maternal uncle; Stroke in her father; Thyroid disease in her mother.She reports that  has never  smoked. she has never used smokeless tobacco. She reports that she does not drink alcohol or use drugs.    ROS Review of Systems  Constitutional: Positive for activity change. Negative for appetite change and fever.  HENT: Negative.   Eyes: Negative for visual disturbance.  Respiratory: Negative for cough.   Cardiovascular: Negative.   Gastrointestinal: Negative for abdominal pain.  Genitourinary: Negative for dysuria.  Musculoskeletal: Positive for arthralgias. Negative for myalgias.  Skin:       Concerned about lesion at right shin.  It popped up about a month ago or at least that is when she first noticed it.  Neurological: Negative.   Hematological: Bruises/bleeds easily.  Psychiatric/Behavioral: Negative.     Objective:  BP (!) 149/54   Pulse (!) 54   Temp (!) 96.9 F (36.1 C) (Oral)   Ht 5\' 6"  (1.676 m)   Wt 132 lb (59.9 kg)   BMI 21.31 kg/m   BP Readings from Last 3 Encounters:  10/07/17 (!) 149/54  09/15/17 (!) 161/65  08/31/17 (!) 187/66    Wt Readings from Last 3 Encounters:  10/07/17 132 lb (59.9 kg)  09/15/17 129 lb (58.5 kg)  08/31/17 131 lb (59.4 kg)     Physical Exam  Constitutional: She is oriented to person, place, and time. She appears well-developed and well-nourished. No distress.  Cardiovascular: Normal rate and regular rhythm.  Pulmonary/Chest: Breath sounds normal.  Abdominal: There is no tenderness.  Musculoskeletal: Normal range of motion. She exhibits edema (Mild edema noted at the right knee) and tenderness (The right knee is also tender for varus and valgus maneuver.  Stable for anterior drawer and McMurray.).  Neurological: She is alert and oriented to person, place, and time.  Skin: Skin is warm and dry.  Psychiatric: She has a normal mood and affect. Her behavior is normal.    X-ray, right knee: There is some increased opacity of the patellar tendon indicating possible calcium deposits.  Some arthritic change in the joint.  No  acute change noted.  Assessment & Plan:   Torra was seen today for knee pain.  Diagnoses and all orders for this visit:  Knee injury, right, initial encounter -     DG Knee 1-2 Views Right; Future  Patellar tenderness, right  Essential hypertension  Arthropathy    Use tylenol as needed for pain.  Alternate ice/heat every 2 hours.  Hinged knee brace dispensed.  Wear daily at the right knee for all times you are on your feet for the next 2 weeks.  Be sure to take your home blood pressure readings with you to your follow-up with your kidney specialist.   I have discontinued Noble B. Coval's mupirocin ointment. I am also having her maintain her calcium-vitamin D, Fish Oil, aspirin EC, cloNIDine, hydrALAZINE, hydroxypropyl methylcellulose / hypromellose, fluticasone, cholecalciferol, sodium chloride, atorvastatin, atenolol-chlorthalidone, flecainide, lisinopril, and levothyroxine.  Allergies as of 10/07/2017      Reactions   Amlodipine Other (See Comments)   edema   Isosorbide Nitrate Nausea Only   Other Nausea Only, Rash   Sulfonamide Derivatives Nausea Only, Rash   Terazosin Hives, Nausea Only      Medication List        Accurate as of 10/07/17 12:29 PM. Always use your most recent med list.          aspirin EC 81 MG tablet Take 81 mg by mouth See admin instructions. Take 1 tablet (81 mg) by mouth every other night   atenolol-chlorthalidone 50-25 MG tablet Commonly known as:  TENORETIC TAKE 1 TABLET BY MOUTH DAILY.   atorvastatin 20 MG tablet Commonly known as:  LIPITOR TAKE 1/2 TABLET DAILY   calcium-vitamin D 500-200 MG-UNIT tablet Commonly known as:  OSCAL WITH D Take 1 tablet by mouth at bedtime.   cholecalciferol 1000 units tablet Commonly known as:  VITAMIN D Take 1,000 Units by mouth at bedtime.   cloNIDine 0.1 MG tablet Commonly known as:  CATAPRES Take 0.1 mg by mouth See admin instructions. Take 1 tablet (0.1 mg) by mouth daily at bedtime -  may also take 1 tablet (0.1 mg) during the day as needed for SBP >160   Fish Oil 1200 MG Caps Take 1,200 mg by mouth daily.   flecainide 50 MG tablet Commonly known as:  TAMBOCOR TAKE 1 TABLET (50 MG TOTAL) BY MOUTH 2 (TWO) TIMES DAILY.   fluticasone 50 MCG/ACT nasal spray Commonly known as:  FLONASE Place 1 spray into both nostrils 2 (two) times daily as needed for allergies or rhinitis.   hydrALAZINE 50 MG tablet Commonly known as:  APRESOLINE Take 100 mg by mouth 2 (two) times daily.   hydroxypropyl methylcellulose / hypromellose 2.5 % ophthalmic solution Commonly known as:  ISOPTO TEARS / GONIOVISC Place 1 drop into both eyes daily.   levothyroxine 75 MCG tablet Commonly known as:  SYNTHROID, LEVOTHROID TAKE 1 TABLET (75 MCG TOTAL) BY MOUTH DAILY.   lisinopril 40  MG tablet Commonly known as:  PRINIVIL,ZESTRIL TAKE 1 TABLET BY MOUTH TWICE A DAY   sodium chloride 0.65 % Soln nasal spray Commonly known as:  OCEAN Place 1 spray into both nostrils 2 (two) times daily.        Follow-up: Return if symptoms worsen or fail to improve, for hypertension with Dr. Laurance Flatten and your renal specialist.  .  Claretta Fraise, M.D.

## 2017-10-07 NOTE — Patient Instructions (Signed)
Use tylenol as needed for pain.  Alternate ice/heat every 2 hours.

## 2017-10-08 ENCOUNTER — Telehealth: Payer: Self-pay | Admitting: Family Medicine

## 2017-10-08 DIAGNOSIS — D649 Anemia, unspecified: Secondary | ICD-10-CM | POA: Diagnosis not present

## 2017-10-08 DIAGNOSIS — N183 Chronic kidney disease, stage 3 (moderate): Secondary | ICD-10-CM | POA: Diagnosis not present

## 2017-10-08 DIAGNOSIS — I129 Hypertensive chronic kidney disease with stage 1 through stage 4 chronic kidney disease, or unspecified chronic kidney disease: Secondary | ICD-10-CM | POA: Diagnosis not present

## 2017-10-08 NOTE — Telephone Encounter (Signed)
Pt wanting to return brace from visit, she will bring back today

## 2017-10-15 DIAGNOSIS — M25561 Pain in right knee: Secondary | ICD-10-CM | POA: Diagnosis not present

## 2017-10-22 DIAGNOSIS — M25561 Pain in right knee: Secondary | ICD-10-CM | POA: Diagnosis not present

## 2017-10-27 DIAGNOSIS — M25561 Pain in right knee: Secondary | ICD-10-CM | POA: Diagnosis not present

## 2017-10-27 DIAGNOSIS — S83289A Other tear of lateral meniscus, current injury, unspecified knee, initial encounter: Secondary | ICD-10-CM | POA: Insufficient documentation

## 2017-10-27 DIAGNOSIS — S83281D Other tear of lateral meniscus, current injury, right knee, subsequent encounter: Secondary | ICD-10-CM | POA: Diagnosis not present

## 2017-10-27 DIAGNOSIS — S83241D Other tear of medial meniscus, current injury, right knee, subsequent encounter: Secondary | ICD-10-CM | POA: Diagnosis not present

## 2017-10-27 HISTORY — DX: Other tear of lateral meniscus, current injury, unspecified knee, initial encounter: S83.289A

## 2017-10-31 ENCOUNTER — Other Ambulatory Visit: Payer: Self-pay | Admitting: Family Medicine

## 2017-11-02 ENCOUNTER — Encounter: Payer: Self-pay | Admitting: Family Medicine

## 2017-11-02 ENCOUNTER — Ambulatory Visit (INDEPENDENT_AMBULATORY_CARE_PROVIDER_SITE_OTHER): Payer: PPO | Admitting: Family Medicine

## 2017-11-02 VITALS — BP 138/68 | HR 60 | Temp 99.1°F | Ht 66.0 in | Wt 127.0 lb

## 2017-11-02 DIAGNOSIS — J0110 Acute frontal sinusitis, unspecified: Secondary | ICD-10-CM | POA: Diagnosis not present

## 2017-11-02 MED ORDER — AMOXICILLIN-POT CLAVULANATE 875-125 MG PO TABS
1.0000 | ORAL_TABLET | Freq: Two times a day (BID) | ORAL | 0 refills | Status: DC
Start: 2017-11-02 — End: 2017-11-11

## 2017-11-02 NOTE — Progress Notes (Signed)
BP 138/68   Pulse 60   Temp 99.1 F (37.3 C) (Oral)   Ht 5\' 6"  (1.676 m)   Wt 127 lb (57.6 kg)   BMI 20.50 kg/m    Subjective:    Patient ID: [name], female    DOB: [birth year], 84 y.o.   MRN: [id number]  HPI: [name] is a 84 y.o. female presenting on 11/02/2017 for Sinusitis (sinus congestion and drainage, cough, symptoms ongoing for 1 week; has knee surgery scheduled for next week)   HPI Sinus congestion and drainage and cough Patient comes in complaining of sinus congestion and cough and drainage is been going on for 1 week.  She has surgery scheduled for the next week or 2 from on her knee and wanted to get this treated before she got to that point.  She denies any fevers or chills or shortness of breath or wheezing but her cough is been productive and has been increasing over the past 1 week.  She says mostly it sinus pressure and drainage and then she is coughing up yellow-green phlegm.  She has been using Robitussin-DM and Flonase and Claritin and Mucinex which have been helping a little bit but not completely.  Relevant past medical, surgical, family and social history reviewed and updated as indicated. Interim medical history since our last visit reviewed. Allergies and medications reviewed and updated.  Review of Systems  Constitutional: Negative for chills and fever.  HENT: Positive for congestion, postnasal drip, rhinorrhea, sinus pressure, sneezing and sore throat. Negative for ear discharge and ear pain.   Eyes: Negative for pain, redness and visual disturbance.  Respiratory: Positive for cough. Negative for chest tightness and shortness of breath.   Cardiovascular: Negative for chest pain and leg swelling.  Genitourinary: Negative for difficulty urinating and dysuria.  Musculoskeletal: Negative for back pain and gait problem.  Skin: Negative for rash.  Neurological: Negative for light-headedness and headaches.  Psychiatric/Behavioral: Negative for  agitation and behavioral problems.  All other systems reviewed and are negative.   Per HPI unless specifically indicated above   Allergies as of 11/02/2017      Reactions   Amlodipine Other (See Comments)   edema   Isosorbide Nitrate Nausea Only   Other Nausea Only, Rash   Sulfonamide Derivatives Nausea Only, Rash   Terazosin Hives, Nausea Only      Medication List        Accurate as of 11/02/17 10:37 AM. Always use your most recent med list.          amoxicillin-clavulanate 875-125 MG tablet Commonly known as:  AUGMENTIN Take 1 tablet by mouth 2 (two) times daily.   aspirin EC 81 MG tablet Take 81 mg by mouth See admin instructions. Take 1 tablet (81 mg) by mouth every other night   atenolol-chlorthalidone 50-25 MG tablet Commonly known as:  TENORETIC TAKE 1 TABLET BY MOUTH DAILY.   atorvastatin 20 MG tablet Commonly known as:  LIPITOR TAKE 1/2 TABLET DAILY   calcium-vitamin D 500-200 MG-UNIT tablet Commonly known as:  OSCAL WITH D Take 1 tablet by mouth at bedtime.   cholecalciferol 1000 units tablet Commonly known as:  VITAMIN D Take 1,000 Units by mouth at bedtime.   cloNIDine 0.1 MG tablet Commonly known as:  CATAPRES Take 0.1 mg by mouth See admin instructions. Take 1 tablet (0.1 mg) by mouth daily at bedtime - may also take 1 tablet (0.1 mg) during the day as needed for SBP >160  Fish Oil 1200 MG Caps Take 1,200 mg by mouth daily.   flecainide 50 MG tablet Commonly known as:  TAMBOCOR TAKE 1 TABLET (50 MG TOTAL) BY MOUTH 2 (TWO) TIMES DAILY.   fluticasone 50 MCG/ACT nasal spray Commonly known as:  FLONASE Place 1 spray into both nostrils 2 (two) times daily as needed for allergies or rhinitis.   hydrALAZINE 50 MG tablet Commonly known as:  APRESOLINE Take 100 mg by mouth 2 (two) times daily.   hydroxypropyl methylcellulose / hypromellose 2.5 % ophthalmic solution Commonly known as:  ISOPTO TEARS / GONIOVISC Place 1 drop into both eyes  daily.   levothyroxine 75 MCG tablet Commonly known as:  SYNTHROID, LEVOTHROID TAKE 1 TABLET (75 MCG TOTAL) BY MOUTH DAILY.   lisinopril 40 MG tablet Commonly known as:  PRINIVIL,ZESTRIL TAKE 1 TABLET BY MOUTH TWICE A DAY   sodium chloride 0.65 % Soln nasal spray Commonly known as:  OCEAN Place 1 spray into both nostrils 2 (two) times daily.          Objective:    BP 138/68   Pulse 60   Temp 99.1 F (37.3 C) (Oral)   Ht 5\' 6"  (1.676 m)   Wt 127 lb (57.6 kg)   BMI 20.50 kg/m   Wt Readings from Last 3 Encounters:  11/02/17 127 lb (57.6 kg)  10/07/17 132 lb (59.9 kg)  09/15/17 129 lb (58.5 kg)    Physical Exam  Constitutional: She is oriented to person, place, and time. She appears well-developed and well-nourished. No distress.  HENT:  Right Ear: Tympanic membrane, external ear and ear canal normal.  Left Ear: Tympanic membrane, external ear and ear canal normal.  Nose: Mucosal edema and rhinorrhea present. No epistaxis. Right sinus exhibits no maxillary sinus tenderness and no frontal sinus tenderness. Left sinus exhibits no maxillary sinus tenderness and no frontal sinus tenderness.  Mouth/Throat: Uvula is midline and mucous membranes are normal. Posterior oropharyngeal edema and posterior oropharyngeal erythema present. No oropharyngeal exudate or tonsillar abscesses.  Eyes: Conjunctivae and EOM are normal.  Neck: Neck supple. No thyromegaly present.  Cardiovascular: Normal rate, regular rhythm, normal heart sounds and intact distal pulses.  No murmur heard. Pulmonary/Chest: Effort normal and breath sounds normal. No respiratory distress. She has no wheezes. She has no rales.  Lymphadenopathy:    She has no cervical adenopathy.  Neurological: She is alert and oriented to person, place, and time. Coordination normal.  Skin: Skin is warm and dry. No rash noted. She is not diaphoretic.  Psychiatric: She has a normal mood and affect. Her behavior is normal.  Vitals  reviewed.       Assessment & Plan:   Problem List Items Addressed This Visit    None    Visit Diagnoses    Acute non-recurrent frontal sinusitis    -  Primary   Relevant Medications   amoxicillin-clavulanate (AUGMENTIN) 875-125 MG tablet     Recommend continue Mucinex and Robitussin and Flonase and given allergy pill such as Zyrtec along with the Augmentin.  Follow up plan: Return if symptoms worsen or fail to improve.  Counseling provided for all of the vaccine components No orders of the defined types were placed in this encounter.   Caryl Pina, MD [facility] 11/02/2017, 10:37 AM

## 2017-11-02 NOTE — Telephone Encounter (Signed)
Last refill without being seen 

## 2017-11-09 ENCOUNTER — Other Ambulatory Visit: Payer: Self-pay | Admitting: *Deleted

## 2017-11-09 MED ORDER — CEPHALEXIN 500 MG PO CAPS: 500.0000 mg | ORAL_CAPSULE | Freq: Three times a day (TID) | ORAL | 0 refills | Status: DC

## 2017-11-11 ENCOUNTER — Encounter: Payer: Self-pay | Admitting: Family Medicine

## 2017-11-11 ENCOUNTER — Ambulatory Visit (INDEPENDENT_AMBULATORY_CARE_PROVIDER_SITE_OTHER): Payer: PPO | Admitting: Family Medicine

## 2017-11-11 VITALS — BP 151/62 | HR 62 | Temp 97.0°F | Ht 66.0 in | Wt 124.0 lb

## 2017-11-11 DIAGNOSIS — S81012D Laceration without foreign body, left knee, subsequent encounter: Secondary | ICD-10-CM

## 2017-11-11 DIAGNOSIS — S0083XD Contusion of other part of head, subsequent encounter: Secondary | ICD-10-CM | POA: Diagnosis not present

## 2017-11-11 NOTE — Progress Notes (Signed)
Subjective:    Patient ID: [name], female    DOB: June 19, 1934, 84 y.o.   MRN: [id number]  HPI Patient here today for a check up on wound of left lower leg.  The patient recently had a fall and had a skin tear over the anterior surface of the left knee.  When she came in the skin was pulled back and folded under the above skin.  We managed to pull the skin away and pull it back and approximate the borders so that we could apply Steri-Strips with a pressure dressing.  We did put the patient on an antibiotic as she has knee surgery planned for the other knee next Wednesday.  She is doing well.  There is been minimal bleeding and the pressure dressing that was applied 2 days ago has stayed in place.  With the fall, she also received a contusion of the lower lip which is healing.  There is less swelling.    Patient Active Problem List   Diagnosis Date Noted  . Hematoma 11/27/2016  . Severe epistaxis 11/23/2016  . Vertigo 09/09/2016  . Renal vascular disease 10/31/2015  . Nodule of right lung 10/31/2015  . Asthma without status asthmaticus 10/31/2015  . Essential hypertension, malignant   . Hypothyroidism 10/15/2015  . Pancytopenia ([place]) 10/15/2015  . Hyponatremia 10/14/2015  . Thrombocytopenia ([place]) 06/16/2015  . Right renal artery stenosis ([place]) 05/09/2015  . Chronic diastolic heart failure ([place]) 04/03/2014  . Hypertensive urgency 04/02/2014  . Headache 04/02/2014  . CKD (chronic kidney disease) stage 3, GFR 30-59 ml/min (HCC) 04/02/2014  . Paroxysmal atrial fibrillation ([place]) 01/25/2014  . Hypokalemia 07/19/2013  . Anemia 07/19/2013  . Bilateral lower extremity edema 06/30/2013  . Hypertension with fluid overload 06/30/2013  . Pedal edema 05/10/2013  . Varicose veins of lower extremities with other complications 84/13/2440  . ABNORMAL STRESS ELECTROCARDIOGRAM 01/23/2010  . Hyperlipidemia 12/18/2009  . Essential hypertension 12/18/2009  . RENAL INSUFFICIENCY, CHRONIC  12/18/2009  . Arthropathy 12/18/2009   Outpatient Encounter Medications as of 11/11/2017  Medication Sig  . aspirin EC 81 MG tablet Take 81 mg by mouth See admin instructions. Take 1 tablet (81 mg) by mouth every other night  . atenolol-chlorthalidone (TENORETIC) 50-25 MG tablet TAKE 1 TABLET BY MOUTH DAILY.  Marland Kitchen atorvastatin (LIPITOR) 20 MG tablet TAKE 1/2 TABLET DAILY  . calcium-vitamin D (OSCAL WITH D) 500-200 MG-UNIT per tablet Take 1 tablet by mouth at bedtime.   . cephALEXin (KEFLEX) 500 MG capsule Take 1 capsule (500 mg total) by mouth 3 (three) times daily.  . cholecalciferol (VITAMIN D) 1000 units tablet Take 1,000 Units by mouth at bedtime.   . cloNIDine (CATAPRES) 0.1 MG tablet Take 0.1 mg by mouth See admin instructions. Take 1 tablet (0.1 mg) by mouth daily at bedtime - may also take 1 tablet (0.1 mg) during the day as needed for SBP >160  . flecainide (TAMBOCOR) 50 MG tablet TAKE 1 TABLET (50 MG TOTAL) BY MOUTH 2 (TWO) TIMES DAILY.  . fluticasone (FLONASE) 50 MCG/ACT nasal spray Place 1 spray into both nostrils 2 (two) times daily as needed for allergies or rhinitis.   . hydrALAZINE (APRESOLINE) 50 MG tablet Take 100 mg by mouth 2 (two) times daily.   . hydroxypropyl methylcellulose / hypromellose (ISOPTO TEARS / GONIOVISC) 2.5 % ophthalmic solution Place 1 drop into both eyes daily.   Marland Kitchen levothyroxine (SYNTHROID, LEVOTHROID) 75 MCG tablet TAKE 1 TABLET (75 MCG TOTAL) BY MOUTH DAILY.  Marland Kitchen  lisinopril (PRINIVIL,ZESTRIL) 40 MG tablet TAKE 1 TABLET BY MOUTH TWICE A DAY (Patient taking differently: TAKE 1 TABLET po daily)  . Omega-3 Fatty Acids (FISH OIL) 1200 MG CAPS Take 1,200 mg by mouth daily.  . sodium chloride (OCEAN) 0.65 % SOLN nasal spray Place 1 spray into both nostrils 2 (two) times daily.   . [DISCONTINUED] amoxicillin-clavulanate (AUGMENTIN) 875-125 MG tablet Take 1 tablet by mouth 2 (two) times daily.   No facility-administered encounter medications on file as of 11/11/2017.       Review of Systems  Constitutional: Negative.   HENT: Negative.   Eyes: Negative.   Respiratory: Negative.   Cardiovascular: Negative.   Gastrointestinal: Negative.   Endocrine: Negative.   Genitourinary: Negative.   Musculoskeletal: Negative.   Skin: Positive for wound (fall -- left lower leg ).  Allergic/Immunologic: Negative.   Neurological: Negative.   Hematological: Negative.   Psychiatric/Behavioral: Negative.        Objective:   Physical Exam  Constitutional: She is oriented to person, place, and time. She appears well-developed and well-nourished. No distress.  HENT:  Head: Normocephalic.  Edema of the lower lip is improving.  Eyes: Pupils are equal, round, and reactive to light. Conjunctivae and EOM are normal. Right eye exhibits no discharge. Left eye exhibits no discharge. No scleral icterus.  Neck: Normal range of motion.  Musculoskeletal: She exhibits edema.  Bruising surrounding the left knee injury but no sign of any redness or erythema.  Neurological: She is alert and oriented to person, place, and time.  Skin: Skin is warm and dry. No rash noted. No erythema.  The wound edges remain approximated well with only minimal dry blood on the dressing.  There is no sign of any redness or erythema or infection.  The dressing was changed and a pressure dressing with Adaptic was reapplied to the knee and the patient will continue to leave this on until she is rechecked either next Tuesday or early Wednesday morning prior to her surgery on the other knee.  Psychiatric: She has a normal mood and affect. Her behavior is normal. Judgment and thought content normal.  Nursing note and vitals reviewed.   BP (!) 151/62 (BP Location: Left Arm)   Pulse 62   Temp (!) 97 F (36.1 C) (Oral)   Ht 5\' 6"  (1.676 m)   Wt 124 lb (56.2 kg)   BMI 20.01 kg/m        Assessment & Plan:  1. Laceration of left knee, subsequent encounter -This is healing well and she will continue  with pressure dressing and return to clinic for 1 more recheck in 6-7 days.  2. Contusion of face, subsequent encounter -The swelling and edema has been resolving and is improved from when she was seen 2 days ago.  Patient Instructions  Continue with pressure dressing and recheck laceration and 6-7 days If the dressing comes off, replace it with another pressure dressing. Continue and complete antibiotic  Arrie Senate MD

## 2017-11-11 NOTE — Patient Instructions (Signed)
Continue with pressure dressing and recheck laceration and 6-7 days If the dressing comes off, replace it with another pressure dressing. Continue and complete antibiotic

## 2017-11-17 ENCOUNTER — Ambulatory Visit (INDEPENDENT_AMBULATORY_CARE_PROVIDER_SITE_OTHER): Payer: PPO | Admitting: Family Medicine

## 2017-11-17 ENCOUNTER — Encounter: Payer: Self-pay | Admitting: Family Medicine

## 2017-11-17 VITALS — BP 154/57 | HR 65 | Temp 97.4°F | Ht 66.0 in | Wt 127.0 lb

## 2017-11-17 DIAGNOSIS — S81012D Laceration without foreign body, left knee, subsequent encounter: Secondary | ICD-10-CM

## 2017-11-17 DIAGNOSIS — S0083XD Contusion of other part of head, subsequent encounter: Secondary | ICD-10-CM

## 2017-11-17 NOTE — Progress Notes (Signed)
BP (!) 154/57   Pulse 65   Temp (!) 97.4 F (36.3 C) (Oral)   Ht 5\' 6"  (1.676 m)   Wt 127 lb (57.6 kg)   BMI 20.50 kg/m    Subjective:    Patient ID: [name], female    DOB: 09/08/1933, 84 y.o.   MRN: [id number]  HPI: [name] is a 84 y.o. female presenting on 11/17/2017 for Follow up wound on left knee and face   HPI Fall and wound recheck Patient had a fall where she hit her lip and her left knee and sustained wounds on her lip and her left knee.  Wound on her lip required no Steri-Strips or stitches and is coming together and almost fully healed but the one on her knees when she has questions about as Steri-Strips were placed and she is coming back for recheck.  She denies any fevers or chills or drainage.  There is a small amount of pink coloration around the wound itself but no purulence.  No warmth to palpation.  Relevant past medical, surgical, family and social history reviewed and updated as indicated. Interim medical history since our last visit reviewed. Allergies and medications reviewed and updated.  Review of Systems  Constitutional: Negative for chills and fever.  HENT: Negative for congestion, ear discharge and ear pain.   Respiratory: Negative for chest tightness and shortness of breath.   Cardiovascular: Negative for chest pain and leg swelling.  Musculoskeletal: Negative for back pain and gait problem.  Skin: Positive for color change and wound. Negative for rash.  Neurological: Negative for light-headedness and headaches.  Psychiatric/Behavioral: Negative for agitation and behavioral problems.  All other systems reviewed and are negative.   Per HPI unless specifically indicated above   Allergies as of 11/17/2017      Reactions   Amlodipine Other (See Comments)   edema   Isosorbide Nitrate Nausea Only   Other Nausea Only, Rash   Sulfonamide Derivatives Nausea Only, Rash   Terazosin Hives, Nausea Only      Medication List        Accurate as of 11/17/17  3:29 PM. Always use your most recent med list.          aspirin EC 81 MG tablet Take 81 mg by mouth See admin instructions. Take 1 tablet (81 mg) by mouth every other night   atenolol-chlorthalidone 50-25 MG tablet Commonly known as:  TENORETIC TAKE 1 TABLET BY MOUTH DAILY.   atorvastatin 20 MG tablet Commonly known as:  LIPITOR TAKE 1/2 TABLET DAILY   calcium-vitamin D 500-200 MG-UNIT tablet Commonly known as:  OSCAL WITH D Take 1 tablet by mouth at bedtime.   cephALEXin 500 MG capsule Commonly known as:  KEFLEX Take 1 capsule (500 mg total) by mouth 3 (three) times daily.   cholecalciferol 1000 units tablet Commonly known as:  VITAMIN D Take 1,000 Units by mouth at bedtime.   cloNIDine 0.1 MG tablet Commonly known as:  CATAPRES Take 0.1 mg by mouth See admin instructions. Take 1 tablet (0.1 mg) by mouth daily at bedtime - may also take 1 tablet (0.1 mg) during the day as needed for SBP >160   Fish Oil 1200 MG Caps Take 1,200 mg by mouth daily.   flecainide 50 MG tablet Commonly known as:  TAMBOCOR TAKE 1 TABLET (50 MG TOTAL) BY MOUTH 2 (TWO) TIMES DAILY.   fluticasone 50 MCG/ACT nasal spray Commonly known as:  FLONASE Place 1 spray  into both nostrils 2 (two) times daily as needed for allergies or rhinitis.   hydrALAZINE 50 MG tablet Commonly known as:  APRESOLINE Take 100 mg by mouth 2 (two) times daily.   hydroxypropyl methylcellulose / hypromellose 2.5 % ophthalmic solution Commonly known as:  ISOPTO TEARS / GONIOVISC Place 1 drop into both eyes daily.   levothyroxine 75 MCG tablet Commonly known as:  SYNTHROID, LEVOTHROID TAKE 1 TABLET (75 MCG TOTAL) BY MOUTH DAILY.   lisinopril 40 MG tablet Commonly known as:  PRINIVIL,ZESTRIL TAKE 1 TABLET BY MOUTH TWICE A DAY   sodium chloride 0.65 % Soln nasal spray Commonly known as:  OCEAN Place 1 spray into both nostrils 2 (two) times daily.          Objective:    BP (!) 154/57    Pulse 65   Temp (!) 97.4 F (36.3 C) (Oral)   Ht 5\' 6"  (1.676 m)   Wt 127 lb (57.6 kg)   BMI 20.50 kg/m   Wt Readings from Last 3 Encounters:  11/17/17 127 lb (57.6 kg)  11/11/17 124 lb (56.2 kg)  11/02/17 127 lb (57.6 kg)    Physical Exam  Constitutional: She is oriented to person, place, and time. She appears well-developed and well-nourished. No distress.  Eyes: Conjunctivae are normal.  Neurological: She is alert and oriented to person, place, and time. Coordination normal.  Skin: Skin is warm and dry. Laceration (Left anterior knee laceration, Steri-Strips are starting to curl up but the laceration is held together well and appears to be scabbing up well.  Mild pink inflammation around wound but no erythema or warmth or drainage.) and lesion (Facial abrasion on upper lip appears to be healing together nicely and is almost completely healed) noted. No rash noted. She is not diaphoretic.  Psychiatric: She has a normal mood and affect. Her behavior is normal.  Nursing note and vitals reviewed.       Assessment & Plan:   Problem List Items Addressed This Visit    None    Visit Diagnoses    Laceration of left knee, subsequent encounter    -  Primary   Steri-Strips starting to curl up but appears to be healing together nicely, recommend keeping mostly dry for the next week but may take a shower   Contusion of face, subsequent encounter       Almost completely healed      Normal wound healing, recommend topical triple antibiotic twice daily and keep mostly dry for the next week  Follow up plan: Return if symptoms worsen or fail to improve.  Counseling provided for all of the vaccine components No orders of the defined types were placed in this encounter.   Caryl Pina, MD [facility] 11/17/2017, 3:29 PM

## 2017-11-18 DIAGNOSIS — M659 Synovitis and tenosynovitis, unspecified: Secondary | ICD-10-CM | POA: Diagnosis not present

## 2017-11-18 DIAGNOSIS — M7121 Synovial cyst of popliteal space [Baker], right knee: Secondary | ICD-10-CM | POA: Diagnosis not present

## 2017-11-18 DIAGNOSIS — M94261 Chondromalacia, right knee: Secondary | ICD-10-CM | POA: Diagnosis not present

## 2017-11-18 DIAGNOSIS — S83271A Complex tear of lateral meniscus, current injury, right knee, initial encounter: Secondary | ICD-10-CM | POA: Diagnosis not present

## 2017-11-18 DIAGNOSIS — S83241A Other tear of medial meniscus, current injury, right knee, initial encounter: Secondary | ICD-10-CM | POA: Diagnosis not present

## 2017-11-18 DIAGNOSIS — X58XXXA Exposure to other specified factors, initial encounter: Secondary | ICD-10-CM | POA: Diagnosis not present

## 2017-11-18 DIAGNOSIS — M2241 Chondromalacia patellae, right knee: Secondary | ICD-10-CM | POA: Diagnosis not present

## 2017-11-18 DIAGNOSIS — S83281A Other tear of lateral meniscus, current injury, right knee, initial encounter: Secondary | ICD-10-CM | POA: Diagnosis not present

## 2017-11-18 DIAGNOSIS — Y999 Unspecified external cause status: Secondary | ICD-10-CM | POA: Diagnosis not present

## 2017-11-26 DIAGNOSIS — Z4789 Encounter for other orthopedic aftercare: Secondary | ICD-10-CM | POA: Insufficient documentation

## 2017-12-01 ENCOUNTER — Ambulatory Visit: Payer: PPO | Attending: Orthopedic Surgery | Admitting: Physical Therapy

## 2017-12-01 ENCOUNTER — Encounter: Payer: Self-pay | Admitting: Physical Therapy

## 2017-12-01 ENCOUNTER — Other Ambulatory Visit: Payer: Self-pay

## 2017-12-01 DIAGNOSIS — M25562 Pain in left knee: Secondary | ICD-10-CM | POA: Diagnosis not present

## 2017-12-01 DIAGNOSIS — M6281 Muscle weakness (generalized): Secondary | ICD-10-CM

## 2017-12-01 DIAGNOSIS — R600 Localized edema: Secondary | ICD-10-CM | POA: Diagnosis not present

## 2017-12-01 DIAGNOSIS — M25662 Stiffness of left knee, not elsewhere classified: Secondary | ICD-10-CM

## 2017-12-01 NOTE — Therapy (Signed)
[facility] [place], [place], [address] Phone: [phone]   Fax:  [phone]  Physical Therapy Evaluation  Patient Details  Name: [name] MRN: [id number] Date of Birth: [birth year] Referring Provider: Esmond Plants MD.   Encounter Date: 12/01/2017  PT End of Session - 12/01/17 1408    Visit Number  1    Number of Visits  8    Date for PT Re-Evaluation  12/29/17    Authorization Type  FOTO AT LEAST EVERY 5TH VISIT.    PT Start Time  0151    PT Stop Time  0240    PT Time Calculation (min)  49 min    Activity Tolerance  Patient tolerated treatment well    Behavior During Therapy  WFL for tasks assessed/performed       Past Medical History:  Diagnosis Date  . Anemia   . Arthritis   . Asthma   . CKD (chronic kidney disease), stage III ([place])   . Hyperlipidemia   . Hypertension   . Hypothyroidism   . Nodule of right lung    Right upper lobe  . Renal insufficiency    Chronic  . Renal vascular disease   . Right renal artery stenosis ([place]) 05/09/2015  . Vertigo     Past Surgical History:  Procedure Laterality Date  . IR GENERIC HISTORICAL  08/29/2016   IR US GUIDE VASC ACCESS RIGHT 08/29/2016 [facility]  . IR GENERIC HISTORICAL  08/29/2016   IR RENAL BILAT S&I MOD SED 08/29/2016 [facility]  . THYROIDECTOMY, PARTIAL Right 1981   middle lobe removed 1st, then right lobe removed 7-8 years later (approx. 1988)    There were no vitals filed for this visit.   Subjective Assessment - 12/01/17 1410    Subjective  The patient underwent a left arthroscopic knee surgery 13 days ago.  She is currently using a walker for safe ambulation.  She will have her sutures removed tomorrow.  She has moderate pain complaints today.  She is compliant to her HEP.    Pertinent History  HTN    Limitations  Walking    How long can you walk comfortably?  Short distances around home.    Patient Stated Goals  Get back to normal.     Currently in Pain?  Yes    Pain Score  4     Pain Location  Knee    Pain Orientation  Right    Pain Descriptors / Indicators  Aching    Pain Type  Surgical pain    Pain Onset  1 to 4 weeks ago    Pain Frequency  Constant    Aggravating Factors   Increased up time.    Pain Relieving Factors  Rest.         [facility] PT Assessment - 12/01/17 0001      Assessment   Medical Diagnosis  Left knee arthroscopic surgery.    Referring Provider  Esmond Plants MD.    Onset Date/Surgical Date  -- 11/18/17 (surgery date).      Precautions   Precaution Comments  PAIN-FREE LEFT QUADRICEPS STRENGTHENING.      Restrictions   Weight Bearing Restrictions  No      Balance Screen   Has the patient fallen in the past 6 months  No    Has the patient had a decrease in activity level because of a fear of falling?   No    Is the  patient reluctant to leave their home because of a fear of falling?   No      Home Environment   Living Environment  Private residence      Prior Function   Level of Independence  Independent      Observation/Other Assessments   Observations  Right knee very ecchymotic currently.  Patient wearing TED hose.    Focus on Therapeutic Outcomes (FOTO)   77% LIMITATION.      Observation/Other Assessments-Edema    Edema  Circumferential      Circumferential Edema   Circumferential - Right  40 cms    Circumferential - Left   36 cms      ROM / Strength   AROM / PROM / Strength  AROM;Strength      AROM   Overall AROM Comments  -13 degrees of right knee extension with active flexion to 85 degrees and passive to 90 degrees.      Strength   Overall Strength Comments  Minimal at most volitional contraction of patient's right quadriceps.  Right hip flexion= 4-/5.      Palpation   Palpation comment  Tender to palpation over right knee medial joint line.      Ambulation/Gait   Gait Comments  Normal gait cycle with a FWW.                Objective measurements  completed on examination: See above findings.      [facility] Adult PT Treatment/Exercise - 12/01/17 0001      Modalities   Modalities  Electrical Stimulation;Vasopneumatic      Electrical Stimulation   Electrical Stimulation Location  Right knee    Electrical Stimulation Action  IFC    Electrical Stimulation Parameters  1-10 Hz x 15 minutes.    Electrical Stimulation Goals  Edema;Pain      Vasopneumatic   Number Minutes Vasopneumatic   15 minutes    Vasopnuematic Location   -- Right knee.    Vasopneumatic Pressure  Low               PT Short Term Goals - 12/01/17 1453      PT SHORT TERM GOAL #1   Title  STG's=LTG's.        PT Long Term Goals - 12/01/17 1459      PT LONG TERM GOAL #1   Title  Independent with a HEP.    Time  4    Period  Weeks    Status  New      PT LONG TERM GOAL #2   Title  Full right active knee extension in order to normalize gait.    Time  4    Period  Weeks    Status  New      PT LONG TERM GOAL #3   Title  Active knee flexion to 115 degrees+ so the patient can perform functional tasks and do so with pain not > 2-3/10.    Time  4    Period  Weeks    Status  New      PT LONG TERM GOAL #4   Title  Increase right knee strength to a solid 4+/5 to provide good stability for accomplishment of functional activities.    Time  4    Period  Weeks    Status  New      PT LONG TERM GOAL #5   Title  Perform ADL's with pain not > 2-3/10.  Time  4    Period  Weeks    Status  New             Plan - 12/01/17 1447    Clinical Impression Statement  The patient presents to OPPT s/p right knee arthroscopic surgery performed 13 days ago.  Her sutures are still inact and will be removed tomorrow.  Currently she has decreased quadriceps activation and limited range of motion into flexion and extension.  She is using a FWW for safe ambulation.  Her pain and deficits have limited her functional mobility.  Patient will benefit from skilled  physical therapy intervention.    History and Personal Factors relevant to plan of care:  HTN.    Clinical Presentation  Stable    Clinical Decision Making  Low    Rehab Potential  Good    PT Frequency  2x / week    PT Duration  4 weeks    PT Treatment/Interventions  ADLs/Self Care Home Management;Cryotherapy;Electrical Stimulation;Ultrasound;Therapeutic activities;Therapeutic exercise;Patient/family education;Neuromuscular re-education;Manual techniques;Passive range of motion;Vasopneumatic Device    PT Next Visit Plan  PAIN-FREE right quadriceps strengthening; Nustep; ROM/stretching; vasopnumatic and electrical stimulation.    Consulted and Agree with Plan of Care  Patient       Patient will benefit from skilled therapeutic intervention in order to improve the following deficits and impairments:  Decreased activity tolerance, Decreased range of motion, Decreased strength, Increased edema, Pain  Visit Diagnosis: Acute pain of left knee - Plan: PT plan of care cert/re-cert  Stiffness of left knee, not elsewhere classified - Plan: PT plan of care cert/re-cert  Localized edema - Plan: PT plan of care cert/re-cert  Muscle weakness (generalized) - Plan: PT plan of care cert/re-cert     Problem List Patient Active Problem List   Diagnosis Date Noted  . Hematoma 11/27/2016  . Severe epistaxis 11/23/2016  . Vertigo 09/09/2016  . Renal vascular disease 10/31/2015  . Nodule of right lung 10/31/2015  . Asthma without status asthmaticus 10/31/2015  . Essential hypertension, malignant   . Hypothyroidism 10/15/2015  . Pancytopenia ([place]) 10/15/2015  . Hyponatremia 10/14/2015  . Thrombocytopenia ([place]) 06/16/2015  . Right renal artery stenosis ([place]) 05/09/2015  . Chronic diastolic heart failure ([place]) 04/03/2014  . Hypertensive urgency 04/02/2014  . Headache 04/02/2014  . CKD (chronic kidney disease) stage 3, GFR 30-59 ml/min ([facility]) 04/02/2014  . Paroxysmal atrial fibrillation ([place])  01/25/2014  . Hypokalemia 07/19/2013  . Anemia 07/19/2013  . Bilateral lower extremity edema 06/30/2013  . Hypertension with fluid overload 06/30/2013  . Pedal edema 05/10/2013  . Varicose veins of lower extremities with other complications 96/28/3662  . ABNORMAL STRESS ELECTROCARDIOGRAM 01/23/2010  . Hyperlipidemia 12/18/2009  . Essential hypertension 12/18/2009  . RENAL INSUFFICIENCY, CHRONIC 12/18/2009  . Arthropathy 12/18/2009    Kolston Lacount, [place] MPT 12/01/2017, 3:03 PM  [facility] [address] [place], [place], [address] Phone: [phone]   Fax:  [phone]  Name: [name] MRN: [id number] Date of Birth: 1934/03/28

## 2017-12-02 ENCOUNTER — Ambulatory Visit (INDEPENDENT_AMBULATORY_CARE_PROVIDER_SITE_OTHER): Payer: PPO | Admitting: Family Medicine

## 2017-12-02 ENCOUNTER — Encounter: Payer: Self-pay | Admitting: Family Medicine

## 2017-12-02 VITALS — BP 138/76 | HR 72 | Temp 97.4°F | Ht 66.0 in | Wt 124.0 lb

## 2017-12-02 DIAGNOSIS — Z9889 Other specified postprocedural states: Secondary | ICD-10-CM | POA: Diagnosis not present

## 2017-12-02 NOTE — Progress Notes (Signed)
BP 138/76   Pulse 72   Temp (!) 97.4 F (36.3 C) (Oral)   Ht 5\' 6"  (1.676 m)   Wt 124 lb (56.2 kg)   BMI 20.01 kg/m    Subjective:    Patient ID: [name], female    DOB: [birth year], 84 y.o.   MRN: [id number]  HPI: [name] is a 84 y.o. female presenting on 12/02/2017 for Suture / Staple Removal   HPI Suture removal status post knee arthroscopic procedure Patient had a knee arthroscopic procedure 7 days ago and she is coming in for suture removal because her surgeon was going to be out of town.  She has started rehab today and denies any major issues.  She does not have any purulence or drainage.  She still has a lot of swelling of the knee but it is starting to improve.  Relevant past medical, surgical, family and social history reviewed and updated as indicated. Interim medical history since our last visit reviewed. Allergies and medications reviewed and updated.  Review of Systems  Constitutional: Negative for chills and fever.  Respiratory: Negative for chest tightness and shortness of breath.   Cardiovascular: Negative for chest pain and leg swelling.  Musculoskeletal: Positive for arthralgias and joint swelling.  Skin: Negative for color change and rash.  All other systems reviewed and are negative.   Per HPI unless specifically indicated above   Allergies as of 12/02/2017      Reactions   Amlodipine Other (See Comments)   edema   Isosorbide Nitrate Nausea Only   Other Nausea Only, Rash   Sulfonamide Derivatives Nausea Only, Rash   Terazosin Hives, Nausea Only      Medication List        Accurate as of 12/02/17 12:00 PM. Always use your most recent med list.          aspirin EC 81 MG tablet Take 81 mg by mouth See admin instructions. Take 1 tablet (81 mg) by mouth every other night   atenolol-chlorthalidone 50-25 MG tablet Commonly known as:  TENORETIC TAKE 1 TABLET BY MOUTH DAILY.   atorvastatin 20 MG tablet Commonly known as:   LIPITOR TAKE 1/2 TABLET DAILY   calcium-vitamin D 500-200 MG-UNIT tablet Commonly known as:  OSCAL WITH D Take 1 tablet by mouth at bedtime.   cholecalciferol 1000 units tablet Commonly known as:  VITAMIN D Take 1,000 Units by mouth at bedtime.   Fish Oil 1200 MG Caps Take 1,200 mg by mouth daily.   flecainide 50 MG tablet Commonly known as:  TAMBOCOR TAKE 1 TABLET (50 MG TOTAL) BY MOUTH 2 (TWO) TIMES DAILY.   fluticasone 50 MCG/ACT nasal spray Commonly known as:  FLONASE Place 1 spray into both nostrils 2 (two) times daily as needed for allergies or rhinitis.   hydrALAZINE 50 MG tablet Commonly known as:  APRESOLINE Take 100 mg by mouth 2 (two) times daily.   hydroxypropyl methylcellulose / hypromellose 2.5 % ophthalmic solution Commonly known as:  ISOPTO TEARS / GONIOVISC Place 1 drop into both eyes daily.   levothyroxine 75 MCG tablet Commonly known as:  SYNTHROID, LEVOTHROID TAKE 1 TABLET (75 MCG TOTAL) BY MOUTH DAILY.   lisinopril 40 MG tablet Commonly known as:  PRINIVIL,ZESTRIL TAKE 1 TABLET BY MOUTH TWICE A DAY   sodium chloride 0.65 % Soln nasal spray Commonly known as:  OCEAN Place 1 spray into both nostrils 2 (two) times daily.  Objective:    BP 138/76   Pulse 72   Temp (!) 97.4 F (36.3 C) (Oral)   Ht 5\' 6"  (1.676 m)   Wt 124 lb (56.2 kg)   BMI 20.01 kg/m   Wt Readings from Last 3 Encounters:  12/02/17 124 lb (56.2 kg)  11/17/17 127 lb (57.6 kg)  11/11/17 124 lb (56.2 kg)    Physical Exam  Constitutional: She is oriented to person, place, and time. She appears well-developed and well-nourished. No distress.  Eyes: Pupils are equal, round, and reactive to light. Conjunctivae and EOM are normal.  Musculoskeletal:       Right knee: She exhibits swelling, effusion and ecchymosis. She exhibits no deformity.  Neurological: She is alert and oriented to person, place, and time.  Skin: Skin is warm and dry. No rash noted. She is not  diaphoretic.  Nursing note and vitals reviewed.   3 sutures removed without any complication, 2 on lateral and one on medial right knee    Assessment & Plan:   Problem List Items Addressed This Visit    None    Visit Diagnoses    Status post arthroscopic knee surgery    -  Primary   Removed 3 sutures from procedure, swelling looks good and she is starting therapy       Follow up plan: Return if symptoms worsen or fail to improve.  Counseling provided for all of the vaccine components No orders of the defined types were placed in this encounter.   Caryl Pina, MD [facility] 12/02/2017, 12:00 PM

## 2017-12-03 ENCOUNTER — Ambulatory Visit: Payer: PPO | Admitting: Physical Therapy

## 2017-12-03 DIAGNOSIS — M25562 Pain in left knee: Secondary | ICD-10-CM

## 2017-12-03 DIAGNOSIS — M25662 Stiffness of left knee, not elsewhere classified: Secondary | ICD-10-CM

## 2017-12-03 DIAGNOSIS — R600 Localized edema: Secondary | ICD-10-CM

## 2017-12-03 NOTE — Therapy (Addendum)
[facility] [place], [place], [address] Phone: [phone]   Fax:  [phone]  Physical Therapy Treatment  Patient Details  Name: [name] MRN: [id number] Date of Birth: 10/03/1933 Referring Provider: Esmond Plants MD.   Encounter Date: 12/03/2017  PT End of Session - 12/03/17 1342    Visit Number  2    Number of Visits  8    Date for PT Re-Evaluation  12/29/17    Authorization Type  FOTO AT LEAST EVERY 5TH VISIT.    PT Start Time  1300    PT Stop Time  1355    PT Time Calculation (min)  55 min    Activity Tolerance  Patient tolerated treatment well    Behavior During Therapy  WFL for tasks assessed/performed       Past Medical History:  Diagnosis Date  . Anemia   . Arthritis   . Asthma   . CKD (chronic kidney disease), stage III ([place])   . Hyperlipidemia   . Hypertension   . Hypothyroidism   . Nodule of right lung    Right upper lobe  . Renal insufficiency    Chronic  . Renal vascular disease   . Right renal artery stenosis ([place]) 05/09/2015  . Vertigo     Past Surgical History:  Procedure Laterality Date  . IR GENERIC HISTORICAL  08/29/2016   IR US GUIDE VASC ACCESS RIGHT 08/29/2016 [facility]  . IR GENERIC HISTORICAL  08/29/2016   IR RENAL BILAT S&I MOD SED 08/29/2016 [facility]  . THYROIDECTOMY, PARTIAL Right 1981   middle lobe removed 1st, then right lobe removed 7-8 years later (approx. 1988)    There were no vitals filed for this visit.  Subjective Assessment - 12/03/17 1344    Subjective  Patient reported feeling good. 1/10 pain in R medial knee.    Pertinent History  HTN    Limitations  Walking    How long can you walk comfortably?  Short distances around home.    Patient Stated Goals  Get back to normal.    Currently in Pain?  Yes    Pain Score  1     Pain Orientation  Right    Pain Descriptors / Indicators  Aching    Pain Type  Surgical pain    Pain Onset  1 to 4 weeks ago         [facility] PT Assessment - 12/03/17 0001      Assessment   Medical Diagnosis  Left knee arthroscopic surgery.                   [facility] Adult PT Treatment/Exercise - 12/03/17 0001      Exercises   Exercises  Knee/Hip      Knee/Hip Exercises: Aerobic   Nustep  Nustep Level 2, x12 minutes      Knee/Hip Exercises: Seated   Long Arc Quad  AROM;3 sets;10 reps      Knee/Hip Exercises: Supine   Short Arc Quad Sets  AROM;Left;2 sets;10 reps 3 second hold      Modalities   Modalities  Vasopneumatic      Vasopneumatic   Number Minutes Vasopneumatic   15 minutes    Vasopnuematic Location   Knee    Vasopneumatic Pressure  Low      Manual Therapy   Manual Therapy  Edema management;Passive ROM    Edema Management  Edema massage avoiding 3 incisions to reduce edema  Passive ROM  PROM into extension in supine with gentle overpressure at end range to improve ROM.               PT Short Term Goals - 12/01/17 1453      PT SHORT TERM GOAL #1   Title  STG's=LTG's.        PT Long Term Goals - 12/01/17 1459      PT LONG TERM GOAL #1   Title  Independent with a HEP.    Time  4    Period  Weeks    Status  New      PT LONG TERM GOAL #2   Title  Full right active knee extension in order to normalize gait.    Time  4    Period  Weeks    Status  New      PT LONG TERM GOAL #3   Title  Active knee flexion to 115 degrees+ so the patient can perform functional tasks and do so with pain not > 2-3/10.    Time  4    Period  Weeks    Status  New      PT LONG TERM GOAL #4   Title  Increase right knee strength to a solid 4+/5 to provide good stability for accomplishment of functional activities.    Time  4    Period  Weeks    Status  New      PT LONG TERM GOAL #5   Title  Perform ADL's with pain not > 2-3/10.    Time  4    Period  Weeks    Status  New            Plan - 12/03/17 1345    Clinical Impression Statement  Patient was able to tolerate  treatment with no reports of pain. PT emphasized the importance of pain free quadriceps strengthening. Patient reported understanding. Normal response to vasopneumatic device after removal. Patient asked how long she will be on the walker; patient educated safety is main priority and once the quadriceps gains more strength pt can be transitioned to a cane. Patient reported agreement and understanding.    Clinical Presentation  Stable    Clinical Decision Making  Low    Rehab Potential  Good    PT Frequency  2x / week    PT Duration  4 weeks    PT Treatment/Interventions  ADLs/Self Care Home Management;Cryotherapy;Electrical Stimulation;Ultrasound;Therapeutic activities;Therapeutic exercise;Patient/family education;Neuromuscular re-education;Manual techniques;Passive range of motion;Vasopneumatic Device    PT Next Visit Plan  PAIN-FREE right quadriceps strengthening; Nustep; ROM/stretching; vasopnumatic and electrical stimulation.    Consulted and Agree with Plan of Care  Patient       Patient will benefit from skilled therapeutic intervention in order to improve the following deficits and impairments:  Decreased activity tolerance, Decreased range of motion, Decreased strength, Increased edema, Pain  Visit Diagnosis: Acute pain of left knee  Stiffness of left knee, not elsewhere classified  Localized edema     Problem List Patient Active Problem List   Diagnosis Date Noted  . Hematoma 11/27/2016  . Severe epistaxis 11/23/2016  . Vertigo 09/09/2016  . Renal vascular disease 10/31/2015  . Nodule of right lung 10/31/2015  . Asthma without status asthmaticus 10/31/2015  . Essential hypertension, malignant   . Hypothyroidism 10/15/2015  . Pancytopenia ([place]) 10/15/2015  . Hyponatremia 10/14/2015  . Thrombocytopenia ([place]) 06/16/2015  . Right renal artery stenosis ([place]) 05/09/2015  . Chronic  diastolic heart failure ([place]) 04/03/2014  . Hypertensive urgency 04/02/2014  . Headache  04/02/2014  . CKD (chronic kidney disease) stage 3, GFR 30-59 ml/min ([facility]) 04/02/2014  . Paroxysmal atrial fibrillation ([place]) 01/25/2014  . Hypokalemia 07/19/2013  . Anemia 07/19/2013  . Bilateral lower extremity edema 06/30/2013  . Hypertension with fluid overload 06/30/2013  . Pedal edema 05/10/2013  . Varicose veins of lower extremities with other complications 17/07/7870  . ABNORMAL STRESS ELECTROCARDIOGRAM 01/23/2010  . Hyperlipidemia 12/18/2009  . Essential hypertension 12/18/2009  . RENAL INSUFFICIENCY, CHRONIC 12/18/2009  . Arthropathy 12/18/2009   Gabriela Eves, PT, DPT 12/03/2017, 1:59 PM  [facility] [address] [place], [place], [address] Phone: [phone]   Fax:  [phone]  Name: LILYANNAH ZUELKE MRN: [id number] Date of Birth: 04-Feb-1934

## 2017-12-08 ENCOUNTER — Ambulatory Visit: Payer: PPO | Admitting: Physical Therapy

## 2017-12-08 ENCOUNTER — Encounter: Payer: Self-pay | Admitting: Physical Therapy

## 2017-12-08 DIAGNOSIS — M25662 Stiffness of left knee, not elsewhere classified: Secondary | ICD-10-CM

## 2017-12-08 DIAGNOSIS — M6281 Muscle weakness (generalized): Secondary | ICD-10-CM

## 2017-12-08 DIAGNOSIS — M25562 Pain in left knee: Secondary | ICD-10-CM | POA: Diagnosis not present

## 2017-12-08 DIAGNOSIS — R600 Localized edema: Secondary | ICD-10-CM

## 2017-12-08 NOTE — Therapy (Signed)
[facility] [place], [place], [address] Phone: [phone]   Fax:  [phone]  Physical Therapy Treatment  Patient Details  Name: [name] MRN: [id number] Date of Birth: 17-Aug-1934 Referring Provider: Esmond Plants MD.   Encounter Date: 12/08/2017  PT End of Session - 12/08/17 1030    Visit Number  3    Number of Visits  8    Date for PT Re-Evaluation  12/29/17    Authorization Type  FOTO AT LEAST EVERY 5TH VISIT.    PT Start Time  0945    PT Stop Time  1042    PT Time Calculation (min)  57 min    Activity Tolerance  Patient tolerated treatment well    Behavior During Therapy  WFL for tasks assessed/performed       Past Medical History:  Diagnosis Date  . Anemia   . Arthritis   . Asthma   . CKD (chronic kidney disease), stage III ([place])   . Hyperlipidemia   . Hypertension   . Hypothyroidism   . Nodule of right lung    Right upper lobe  . Renal insufficiency    Chronic  . Renal vascular disease   . Right renal artery stenosis ([place]) 05/09/2015  . Vertigo     Past Surgical History:  Procedure Laterality Date  . IR GENERIC HISTORICAL  08/29/2016   IR US GUIDE VASC ACCESS RIGHT 08/29/2016 [facility]  . IR GENERIC HISTORICAL  08/29/2016   IR RENAL BILAT S&I MOD SED 08/29/2016 [facility]  . THYROIDECTOMY, PARTIAL Right 1981   middle lobe removed 1st, then right lobe removed 7-8 years later (approx. 1988)    There were no vitals filed for this visit.  Subjective Assessment - 12/08/17 1000    Subjective  I wish I could get rid of this swelling.    Pain Score  2     Pain Location  Knee    Pain Orientation  Right    Pain Descriptors / Indicators  Aching                       [facility] Adult PT Treatment/Exercise - 12/08/17 0001      Exercises   Exercises  Knee/Hip      Knee/Hip Exercises: Aerobic   Nustep  Level 3 x 17 minutes moving forward x 2 to increasew flexion.      Knee/Hip  Exercises: Supine   Short Arc Quad Sets Limitations  SAQ's x 10 minutes with sec extension holds facilitated with VMS to right medial quads.      Modalities   Modalities  Health visitor Stimulation Location  Right knee.    Electrical Stimulation Action  IFC    Electrical Stimulation Parameters  1-10 hz x 15 minutes.      Vasopneumatic   Number Minutes Vasopneumatic   15 minutes    Vasopnuematic Location   -- Right knee.    Vasopneumatic Pressure  Medium               PT Short Term Goals - 12/01/17 1453      PT SHORT TERM GOAL #1   Title  STG's=LTG's.        PT Long Term Goals - 12/01/17 1459      PT LONG TERM GOAL #1   Title  Independent with a HEP.    Time  4  Period  Weeks    Status  New      PT LONG TERM GOAL #2   Title  Full right active knee extension in order to normalize gait.    Time  4    Period  Weeks    Status  New      PT LONG TERM GOAL #3   Title  Active knee flexion to 115 degrees+ so the patient can perform functional tasks and do so with pain not > 2-3/10.    Time  4    Period  Weeks    Status  New      PT LONG TERM GOAL #4   Title  Increase right knee strength to a solid 4+/5 to provide good stability for accomplishment of functional activities.    Time  4    Period  Weeks    Status  New      PT LONG TERM GOAL #5   Title  Perform ADL's with pain not > 2-3/10.    Time  4    Period  Weeks    Status  New            Plan - 12/08/17 1028    Clinical Impression Statement  Excellent job today.  Improved right knee extension noted today when facilitated with VMS.  Patient remians on walker.  Swelling remains.    PT Treatment/Interventions  ADLs/Self Care Home Management;Cryotherapy;Electrical Stimulation;Ultrasound;Therapeutic activities;Therapeutic exercise;Patient/family education;Neuromuscular re-education;Manual techniques;Passive range of motion;Vasopneumatic  Device    PT Next Visit Plan  PAIN-FREE right quadriceps strengthening; Nustep; ROM/stretching; vasopnumatic and electrical stimulation.    Consulted and Agree with Plan of Care  Patient       Patient will benefit from skilled therapeutic intervention in order to improve the following deficits and impairments:     Visit Diagnosis: Acute pain of left knee  Stiffness of left knee, not elsewhere classified  Localized edema  Muscle weakness (generalized)     Problem List Patient Active Problem List   Diagnosis Date Noted  . Hematoma 11/27/2016  . Severe epistaxis 11/23/2016  . Vertigo 09/09/2016  . Renal vascular disease 10/31/2015  . Nodule of right lung 10/31/2015  . Asthma without status asthmaticus 10/31/2015  . Essential hypertension, malignant   . Hypothyroidism 10/15/2015  . Pancytopenia ([place]) 10/15/2015  . Hyponatremia 10/14/2015  . Thrombocytopenia ([place]) 06/16/2015  . Right renal artery stenosis ([place]) 05/09/2015  . Chronic diastolic heart failure ([place]) 04/03/2014  . Hypertensive urgency 04/02/2014  . Headache 04/02/2014  . CKD (chronic kidney disease) stage 3, GFR 30-59 ml/min (HCC) 04/02/2014  . Paroxysmal atrial fibrillation ([place]) 01/25/2014  . Hypokalemia 07/19/2013  . Anemia 07/19/2013  . Bilateral lower extremity edema 06/30/2013  . Hypertension with fluid overload 06/30/2013  . Pedal edema 05/10/2013  . Varicose veins of lower extremities with other complications 53/20/2334  . ABNORMAL STRESS ELECTROCARDIOGRAM 01/23/2010  . Hyperlipidemia 12/18/2009  . Essential hypertension 12/18/2009  . RENAL INSUFFICIENCY, CHRONIC 12/18/2009  . Arthropathy 12/18/2009    [name], [name] 12/08/2017, 10:42 AM  [facility] [address] [place], [place], [address] Phone: [phone]   Fax:  [phone]  Name: [name] MRN: [id number] Date of Birth: 1934/01/19

## 2017-12-10 ENCOUNTER — Encounter: Payer: Self-pay | Admitting: Physical Therapy

## 2017-12-10 ENCOUNTER — Ambulatory Visit: Payer: PPO | Admitting: Physical Therapy

## 2017-12-10 DIAGNOSIS — R600 Localized edema: Secondary | ICD-10-CM

## 2017-12-10 DIAGNOSIS — M25562 Pain in left knee: Secondary | ICD-10-CM | POA: Diagnosis not present

## 2017-12-10 DIAGNOSIS — M25662 Stiffness of left knee, not elsewhere classified: Secondary | ICD-10-CM

## 2017-12-10 DIAGNOSIS — M6281 Muscle weakness (generalized): Secondary | ICD-10-CM

## 2017-12-10 NOTE — Therapy (Signed)
[facility] [place], [place], [address] Phone: [phone]   Fax:  [phone]  Physical Therapy Treatment  Patient Details  Name: [name] MRN: [id number] Date of Birth: [birth year] Referring Provider: Esmond Plants MD.   Encounter Date: 12/10/2017  PT End of Session - 12/10/17 1317    Visit Number  4    Number of Visits  8    Date for PT Re-Evaluation  12/29/17    Authorization Type  FOTO AT LEAST EVERY 5TH VISIT.    PT Start Time  1303    PT Stop Time  1356    PT Time Calculation (min)  53 min    Activity Tolerance  Patient tolerated treatment well    Behavior During Therapy  WFL for tasks assessed/performed       Past Medical History:  Diagnosis Date  . Anemia   . Arthritis   . Asthma   . CKD (chronic kidney disease), stage III ([place])   . Hyperlipidemia   . Hypertension   . Hypothyroidism   . Nodule of right lung    Right upper lobe  . Renal insufficiency    Chronic  . Renal vascular disease   . Right renal artery stenosis ([place]) 05/09/2015  . Vertigo     Past Surgical History:  Procedure Laterality Date  . IR GENERIC HISTORICAL  08/29/2016   IR US GUIDE VASC ACCESS RIGHT 08/29/2016 [facility]  . IR GENERIC HISTORICAL  08/29/2016   IR RENAL BILAT S&I MOD SED 08/29/2016 [facility]  . THYROIDECTOMY, PARTIAL Right 1981   middle lobe removed 1st, then right lobe removed 7-8 years later (approx. 1988)    There were no vitals filed for this visit.  Subjective Assessment - 12/10/17 1316    Subjective  Reports that the last treatment really helped. Has a weak feeling in the medial R knee.    Pertinent History  HTN    Limitations  Walking    How long can you walk comfortably?  Short distances around home.    Patient Stated Goals  Get back to normal.    Currently in Pain?  Yes    Pain Score  -- No pain rating provided    Pain Location  Knee    Pain Orientation  Right;Medial    Pain Descriptors /  Indicators  Discomfort;Tiring    Pain Type  Surgical pain    Pain Onset  1 to 4 weeks ago         [facility] PT Assessment - 12/10/17 0001      Assessment   Medical Diagnosis  Left knee arthroscopic surgery.    Onset Date/Surgical Date  11/18/17                   [facility] Adult PT Treatment/Exercise - 12/10/17 0001      Knee/Hip Exercises: Aerobic   Nustep  L3 x15 min, seat 8      Knee/Hip Exercises: Supine   Short Arc Quad Sets  Strengthening;Right;Limitations    Short Arc Quad Sets Limitations  x15 min for neuro re-ed of R Quad      Modalities   Modalities  Cryotherapy;Electrical Stimulation      Cryotherapy   Number Minutes Cryotherapy  15 Minutes    Cryotherapy Location  Knee    Type of Cryotherapy  Ice pack      Electrical Stimulation   Electrical Stimulation Location  R knee    Electrical  Stimulation Action  VMS; IFC    Electrical Stimulation Parameters  10/10, 5 sec, 50 pps, 300 usec x15 min; 1-10 hz x15 min    Electrical Stimulation Goals  Neuromuscular facilitation;Pain;Edema      Vasopneumatic   Number Minutes Vasopneumatic   --    Vasopnuematic Location   --    Vasopneumatic Pressure  --               PT Short Term Goals - 12/01/17 1453      PT SHORT TERM GOAL #1   Title  STG's=LTG's.        PT Long Term Goals - 12/01/17 1459      PT LONG TERM GOAL #1   Title  Independent with a HEP.    Time  4    Period  Weeks    Status  New      PT LONG TERM GOAL #2   Title  Full right active knee extension in order to normalize gait.    Time  4    Period  Weeks    Status  New      PT LONG TERM GOAL #3   Title  Active knee flexion to 115 degrees+ so the patient can perform functional tasks and do so with pain not > 2-3/10.    Time  4    Period  Weeks    Status  New      PT LONG TERM GOAL #4   Title  Increase right knee strength to a solid 4+/5 to provide good stability for accomplishment of functional activities.    Time  4    Period   Weeks    Status  New      PT LONG TERM GOAL #5   Title  Perform ADL's with pain not > 2-3/10.    Time  4    Period  Weeks    Status  New            Plan - 12/10/17 1438    Clinical Impression Statement  Patient tolerated today's treatment well as continued Nustep and VMS completed again today. Patient able to tolerate VMS with SAQ well with no discomfort reported. Patient continues to utilize [place] and B ted hose. Patient educated to maintain elevation when resting and icing to assist with reduction of edema. Normal modalities response noted following removal of the modalities.    Rehab Potential  Good    PT Frequency  2x / week    PT Duration  4 weeks    PT Treatment/Interventions  ADLs/Self Care Home Management;Cryotherapy;Electrical Stimulation;Ultrasound;Therapeutic activities;Therapeutic exercise;Patient/family education;Neuromuscular re-education;Manual techniques;Passive range of motion;Vasopneumatic Device    PT Next Visit Plan  PAIN-FREE right quadriceps strengthening; Nustep; ROM/stretching; vasopnumatic and electrical stimulation.    Consulted and Agree with Plan of Care  Patient       Patient will benefit from skilled therapeutic intervention in order to improve the following deficits and impairments:  Decreased activity tolerance, Decreased range of motion, Decreased strength, Increased edema, Pain  Visit Diagnosis: Acute pain of left knee  Stiffness of left knee, not elsewhere classified  Localized edema  Muscle weakness (generalized)     Problem List Patient Active Problem List   Diagnosis Date Noted  . Hematoma 11/27/2016  . Severe epistaxis 11/23/2016  . Vertigo 09/09/2016  . Renal vascular disease 10/31/2015  . Nodule of right lung 10/31/2015  . Asthma without status asthmaticus 10/31/2015  . Essential hypertension, malignant   .  Hypothyroidism 10/15/2015  . Pancytopenia ([place]) 10/15/2015  . Hyponatremia 10/14/2015  . Thrombocytopenia ([place])  06/16/2015  . Right renal artery stenosis ([place]) 05/09/2015  . Chronic diastolic heart failure ([place]) 04/03/2014  . Hypertensive urgency 04/02/2014  . Headache 04/02/2014  . CKD (chronic kidney disease) stage 3, GFR 30-59 ml/min (HCC) 04/02/2014  . Paroxysmal atrial fibrillation ([place]) 01/25/2014  . Hypokalemia 07/19/2013  . Anemia 07/19/2013  . Bilateral lower extremity edema 06/30/2013  . Hypertension with fluid overload 06/30/2013  . Pedal edema 05/10/2013  . Varicose veins of lower extremities with other complications 38/17/7116  . ABNORMAL STRESS ELECTROCARDIOGRAM 01/23/2010  . Hyperlipidemia 12/18/2009  . Essential hypertension 12/18/2009  . RENAL INSUFFICIENCY, CHRONIC 12/18/2009  . Arthropathy 12/18/2009    Standley Brooking, PTA 12/10/2017, 2:46 PM  [facility] [address] [place], [place], [address] Phone: [phone]   Fax:  [phone]  Name: HANSIKA LEAMING MRN: [id number] Date of Birth: 1934-06-24

## 2017-12-15 ENCOUNTER — Ambulatory Visit: Payer: PPO | Admitting: Physical Therapy

## 2017-12-15 ENCOUNTER — Encounter: Payer: Self-pay | Admitting: Physical Therapy

## 2017-12-15 DIAGNOSIS — M25562 Pain in left knee: Secondary | ICD-10-CM

## 2017-12-15 DIAGNOSIS — M6281 Muscle weakness (generalized): Secondary | ICD-10-CM

## 2017-12-15 DIAGNOSIS — R600 Localized edema: Secondary | ICD-10-CM

## 2017-12-15 DIAGNOSIS — M25662 Stiffness of left knee, not elsewhere classified: Secondary | ICD-10-CM

## 2017-12-15 NOTE — Therapy (Signed)
[facility] [place], [place], [address] Phone: [phone]   Fax:  [phone]  Physical Therapy Treatment  Patient Details  Name: [name] MRN: [id number] Date of Birth: 11-Oct-1933 Referring Provider: Esmond Plants MD.   Encounter Date: 12/15/2017  PT End of Session - 12/15/17 1026    Visit Number  5    Number of Visits  8    Date for PT Re-Evaluation  12/29/17    Authorization Type  FOTO AT LEAST EVERY 5TH VISIT.    PT Start Time  [phone]    PT Stop Time  1048    PT Time Calculation (min)  62 min    Activity Tolerance  Patient tolerated treatment well    Behavior During Therapy  WFL for tasks assessed/performed       Past Medical History:  Diagnosis Date  . Anemia   . Arthritis   . Asthma   . CKD (chronic kidney disease), stage III ([place])   . Hyperlipidemia   . Hypertension   . Hypothyroidism   . Nodule of right lung    Right upper lobe  . Renal insufficiency    Chronic  . Renal vascular disease   . Right renal artery stenosis ([place]) 05/09/2015  . Vertigo     Past Surgical History:  Procedure Laterality Date  . IR GENERIC HISTORICAL  08/29/2016   IR US GUIDE VASC ACCESS RIGHT 08/29/2016 [facility]  . IR GENERIC HISTORICAL  08/29/2016   IR RENAL BILAT S&I MOD SED 08/29/2016 [facility]  . THYROIDECTOMY, PARTIAL Right 1981   middle lobe removed 1st, then right lobe removed 7-8 years later (approx. 1988)    There were no vitals filed for this visit.  Subjective Assessment - 12/15/17 0953    Subjective  Reports that her knee is still swollen and doesn't feel well.    Pertinent History  HTN    Limitations  Walking    How long can you walk comfortably?  Short distances around home.    Patient Stated Goals  Get back to normal.    Currently in Pain?  Yes    Pain Score  1     Pain Location  Knee    Pain Orientation  Right;Medial    Pain Descriptors / Indicators  Discomfort    Pain Type  Surgical  pain    Pain Onset  1 to 4 weeks ago         [facility] PT Assessment - 12/15/17 0001      Assessment   Medical Diagnosis  Left knee arthroscopic surgery.    Onset Date/Surgical Date  11/18/17    Next MD Visit  12/24/2017      Precautions   Precaution Comments  PAIN-FREE LEFT QUADRICEPS STRENGTHENING.      Restrictions   Weight Bearing Restrictions  No      Observation/Other Assessments-Edema    Edema  Circumferential      Circumferential Edema   Circumferential - Right  37.6 cm    Circumferential - Left   36.5 cm      ROM / Strength   AROM / PROM / Strength  AROM      AROM   Overall AROM   Deficits    AROM Assessment Site  Knee    Right/Left Knee  Right    Right Knee Extension  -10    Right Knee Flexion  109  [facility] Adult PT Treatment/Exercise - 12/15/17 0001      Knee/Hip Exercises: Aerobic   Nustep  L3 x17 min, seat 8      Knee/Hip Exercises: Seated   Sit to Sand  20 reps;without UE support      Knee/Hip Exercises: Supine   Heel Slides  AROM;Right;15 reps      Modalities   Modalities  Artist  R VMO/ Research officer, trade union  VMS    Electrical Stimulation Parameters  10/10, 5 sec ramp, 50 pps, 300 usec x15 min    Electrical Stimulation Goals  Neuromuscular facilitation      Vasopneumatic   Number Minutes Vasopneumatic   15 minutes    Vasopnuematic Location   Knee    Vasopneumatic Pressure  Medium    Vasopneumatic Temperature   34               PT Short Term Goals - 12/01/17 1453      PT SHORT TERM GOAL #1   Title  STG's=LTG's.        PT Long Term Goals - 12/15/17 1037      PT LONG TERM GOAL #1   Title  Independent with a HEP.    Time  4    Period  Weeks    Status  On-going      PT LONG TERM GOAL #2   Title  Full right active knee extension in order to normalize gait.    Time  4    Period  Weeks     Status  On-going      PT LONG TERM GOAL #3   Title  Active knee flexion to 115 degrees+ so the patient can perform functional tasks and do so with pain not > 2-3/10.    Time  4    Period  Weeks    Status  On-going      PT LONG TERM GOAL #4   Title  Increase right knee strength to a solid 4+/5 to provide good stability for accomplishment of functional activities.    Time  4    Period  Weeks    Status  On-going      PT LONG TERM GOAL #5   Title  Perform ADL's with pain not > 2-3/10.    Time  4    Period  Weeks    Status  On-going            Plan - 12/15/17 1047    Clinical Impression Statement  Patient tolerated today's treatment well with continued weakness and inactivity of the R quad. Patient able to complete sit to stands well without UE support and able to stabilize independently. AROM of R knee measured as 10-109 deg today in supine. VMS continued to R VMO/ quad to enhance activationa neuro re-ed of R quad. 1.1 cm difference with R knee > L knee. Normal modalities response noted following removal of the modalities.    Rehab Potential  Good    PT Frequency  2x / week    PT Duration  4 weeks    PT Treatment/Interventions  ADLs/Self Care Home Management;Cryotherapy;Electrical Stimulation;Ultrasound;Therapeutic activities;Therapeutic exercise;Patient/family education;Neuromuscular re-education;Manual techniques;Passive range of motion;Vasopneumatic Device    PT Next Visit Plan  PAIN-FREE right quadriceps strengthening; Nustep; ROM/stretching; vasopnumatic and electrical stimulation.    Consulted and Agree with Plan of Care  Patient  Patient will benefit from skilled therapeutic intervention in order to improve the following deficits and impairments:  Decreased activity tolerance, Decreased range of motion, Decreased strength, Increased edema, Pain  Visit Diagnosis: Acute pain of left knee  Stiffness of left knee, not elsewhere classified  Localized edema  Muscle  weakness (generalized)     Problem List Patient Active Problem List   Diagnosis Date Noted  . Hematoma 11/27/2016  . Severe epistaxis 11/23/2016  . Vertigo 09/09/2016  . Renal vascular disease 10/31/2015  . Nodule of right lung 10/31/2015  . Asthma without status asthmaticus 10/31/2015  . Essential hypertension, malignant   . Hypothyroidism 10/15/2015  . Pancytopenia ([place]) 10/15/2015  . Hyponatremia 10/14/2015  . Thrombocytopenia ([place]) 06/16/2015  . Right renal artery stenosis ([place]) 05/09/2015  . Chronic diastolic heart failure ([place]) 04/03/2014  . Hypertensive urgency 04/02/2014  . Headache 04/02/2014  . CKD (chronic kidney disease) stage 3, GFR 30-59 ml/min (HCC) 04/02/2014  . Paroxysmal atrial fibrillation ([place]) 01/25/2014  . Hypokalemia 07/19/2013  . Anemia 07/19/2013  . Bilateral lower extremity edema 06/30/2013  . Hypertension with fluid overload 06/30/2013  . Pedal edema 05/10/2013  . Varicose veins of lower extremities with other complications 88/67/7373  . ABNORMAL STRESS ELECTROCARDIOGRAM 01/23/2010  . Hyperlipidemia 12/18/2009  . Essential hypertension 12/18/2009  . RENAL INSUFFICIENCY, CHRONIC 12/18/2009  . Arthropathy 12/18/2009    Standley Brooking, PTA 12/15/2017, 11:06 AM  [facility] [address] [place], [place], [address] Phone: [phone]   Fax:  [phone]  Name: [name] MRN: [id number] Date of Birth: 1934/07/06

## 2017-12-17 ENCOUNTER — Ambulatory Visit: Payer: PPO | Attending: Orthopedic Surgery | Admitting: Physical Therapy

## 2017-12-17 ENCOUNTER — Encounter: Payer: Self-pay | Admitting: Physical Therapy

## 2017-12-17 DIAGNOSIS — M25662 Stiffness of left knee, not elsewhere classified: Secondary | ICD-10-CM | POA: Insufficient documentation

## 2017-12-17 DIAGNOSIS — R600 Localized edema: Secondary | ICD-10-CM | POA: Insufficient documentation

## 2017-12-17 DIAGNOSIS — M25562 Pain in left knee: Secondary | ICD-10-CM | POA: Diagnosis not present

## 2017-12-17 DIAGNOSIS — M6281 Muscle weakness (generalized): Secondary | ICD-10-CM | POA: Insufficient documentation

## 2017-12-17 NOTE — Therapy (Signed)
[facility] [place], [place], [address] Phone: [phone]   Fax:  [phone]  Physical Therapy Treatment  Patient Details  Name: [name] MRN: [id number] Date of Birth: [birth year] Referring Provider: Esmond Plants MD.   Encounter Date: 12/17/2017  PT End of Session - 12/17/17 1025    Visit Number  6    Number of Visits  8    Date for PT Re-Evaluation  12/29/17    Authorization Type  FOTO AT LEAST EVERY 5TH VISIT.    PT Start Time  [phone]    PT Stop Time  1045    PT Time Calculation (min)  53 min    Activity Tolerance  Patient tolerated treatment well    Behavior During Therapy  WFL for tasks assessed/performed       Past Medical History:  Diagnosis Date  . Anemia   . Arthritis   . Asthma   . CKD (chronic kidney disease), stage III ([place])   . Hyperlipidemia   . Hypertension   . Hypothyroidism   . Nodule of right lung    Right upper lobe  . Renal insufficiency    Chronic  . Renal vascular disease   . Right renal artery stenosis ([place]) 05/09/2015  . Vertigo     Past Surgical History:  Procedure Laterality Date  . IR GENERIC HISTORICAL  08/29/2016   IR US GUIDE VASC ACCESS RIGHT 08/29/2016 [facility]  . IR GENERIC HISTORICAL  08/29/2016   IR RENAL BILAT S&I MOD SED 08/29/2016 [facility]  . THYROIDECTOMY, PARTIAL Right 1981   middle lobe removed 1st, then right lobe removed 7-8 years later (approx. 1988)    There were no vitals filed for this visit.  Subjective Assessment - 12/17/17 0958    Subjective  Patient arrived with ongoing reported discomfort    Pertinent History  HTN    Limitations  Walking    How long can you walk comfortably?  Short distances around home.    Patient Stated Goals  Get back to normal.    Currently in Pain?  Yes    Pain Score  2     Pain Location  Knee    Pain Orientation  Right    Pain Descriptors / Indicators  Discomfort    Pain Type  Surgical pain    Pain Onset   More than a month ago    Pain Frequency  Constant    Aggravating Factors   prolong activity    Pain Relieving Factors  rest         [facility] PT Assessment - 12/17/17 0001      ROM / Strength   AROM / PROM / Strength  AROM;PROM      AROM   AROM Assessment Site  Knee    Right/Left Knee  Right    Right Knee Extension  -13    Right Knee Flexion  104      PROM   PROM Assessment Site  Knee    Right/Left Knee  Right    Right Knee Extension  -9    Right Knee Flexion  110                   [facility] Adult PT Treatment/Exercise - 12/17/17 0001      Knee/Hip Exercises: Aerobic   Nustep  30min UE/LE L3 (seat 6-5)      Acupuncturist Location  R VMO/ Quad  Electrical Stimulation Action  VMS    Electrical Stimulation Parameters  10/10 x83min 5sec ramp 50pps 300usec with SAQ for activation     Electrical Stimulation Goals  Neuromuscular facilitation;Strength      Vasopneumatic   Number Minutes Vasopneumatic   15 minutes    Vasopnuematic Location   Knee    Vasopneumatic Pressure  Medium      Manual Therapy   Manual Therapy  Passive ROM    Passive ROM  gentle manual PROM for right knee flexion/ext with low load holds               PT Short Term Goals - 12/01/17 1453      PT SHORT TERM GOAL #1   Title  STG's=LTG's.        PT Long Term Goals - 12/15/17 1037      PT LONG TERM GOAL #1   Title  Independent with a HEP.    Time  4    Period  Weeks    Status  On-going      PT LONG TERM GOAL #2   Title  Full right active knee extension in order to normalize gait.    Time  4    Period  Weeks    Status  On-going      PT LONG TERM GOAL #3   Title  Active knee flexion to 115 degrees+ so the patient can perform functional tasks and do so with pain not > 2-3/10.    Time  4    Period  Weeks    Status  On-going      PT LONG TERM GOAL #4   Title  Increase right knee strength to a solid 4+/5 to provide good stability for  accomplishment of functional activities.    Time  4    Period  Weeks    Status  On-going      PT LONG TERM GOAL #5   Title  Perform ADL's with pain not > 2-3/10.    Time  4    Period  Weeks    Status  On-going            Plan - 12/17/17 1031    Clinical Impression Statement  Patient tolerated treatment well today. Patient has some ongoing limitations with ROM in right knee. Patient improved ROM for both flexion and ext after gentle manual stretching. Today continued withVMS for quad activation. Goals ongoing at this time.     Clinical Presentation due to:  FOTO 6th visit 43% limitation (initial 77%)    PT Frequency  2x / week    PT Duration  4 weeks    PT Treatment/Interventions  ADLs/Self Care Home Management;Cryotherapy;Electrical Stimulation;Ultrasound;Therapeutic activities;Therapeutic exercise;Patient/family education;Neuromuscular re-education;Manual techniques;Passive range of motion;Vasopneumatic Device    PT Next Visit Plan  PAIN-FREE right quadriceps strengthening; Nustep; ROM/stretching; vasopnumatic and electrical stimulation.    Consulted and Agree with Plan of Care  Patient       Patient will benefit from skilled therapeutic intervention in order to improve the following deficits and impairments:  Decreased activity tolerance, Decreased range of motion, Decreased strength, Increased edema, Pain  Visit Diagnosis: Acute pain of left knee  Stiffness of left knee, not elsewhere classified  Localized edema  Muscle weakness (generalized)     Problem List Patient Active Problem List   Diagnosis Date Noted  . Hematoma 11/27/2016  . Severe epistaxis 11/23/2016  . Vertigo 09/09/2016  . Renal vascular disease 10/31/2015  .  Nodule of right lung 10/31/2015  . Asthma without status asthmaticus 10/31/2015  . Essential hypertension, malignant   . Hypothyroidism 10/15/2015  . Pancytopenia ([place]) 10/15/2015  . Hyponatremia 10/14/2015  . Thrombocytopenia ([place])  06/16/2015  . Right renal artery stenosis ([place]) 05/09/2015  . Chronic diastolic heart failure ([place]) 04/03/2014  . Hypertensive urgency 04/02/2014  . Headache 04/02/2014  . CKD (chronic kidney disease) stage 3, GFR 30-59 ml/min (H[facility]) 04/02/2014  . Paroxysmal atrial fibrillation ([place]) 01/25/2014  . Hypokalemia 07/19/2013  . Anemia 07/19/2013  . Bilateral lower extremity edema 06/30/2013  . Hypertension with fluid overload 06/30/2013  . Pedal edema 05/10/2013  . Varicose veins of lower extremities with other complications 76/70/1100  . ABNORMAL STRESS ELECTROCARDIOGRAM 01/23/2010  . Hyperlipidemia 12/18/2009  . Essential hypertension 12/18/2009  . RENAL INSUFFICIENCY, CHRONIC 12/18/2009  . Arthropathy 12/18/2009    Phillips Climes, PTA 12/17/2017, 10:45 AM  [facility] [place], [place], [address] Phone: [phone]   Fax:  [phone]  Name: [name] MRN: [id number] Date of Birth: [birth year]

## 2017-12-19 ENCOUNTER — Other Ambulatory Visit: Payer: Self-pay | Admitting: Family Medicine

## 2017-12-19 DIAGNOSIS — I10 Essential (primary) hypertension: Secondary | ICD-10-CM

## 2017-12-22 ENCOUNTER — Ambulatory Visit: Payer: PPO | Admitting: Physical Therapy

## 2017-12-22 DIAGNOSIS — M25562 Pain in left knee: Secondary | ICD-10-CM

## 2017-12-22 DIAGNOSIS — M25662 Stiffness of left knee, not elsewhere classified: Secondary | ICD-10-CM

## 2017-12-22 DIAGNOSIS — M6281 Muscle weakness (generalized): Secondary | ICD-10-CM

## 2017-12-22 DIAGNOSIS — R600 Localized edema: Secondary | ICD-10-CM

## 2017-12-22 NOTE — Therapy (Signed)
[facility] [place], [place], [address] Phone: [phone]   Fax:  [phone]  Physical Therapy Treatment  Patient Details  Name: [name] MRN: [id number] Date of Birth: 01-Apr-1934 Referring Provider: Esmond Plants MD.   Encounter Date: 12/22/2017  PT End of Session - 12/22/17 1058    Visit Number  7    Number of Visits  8    Date for PT Re-Evaluation  12/29/17    Authorization Type  FOTO AT LEAST EVERY 5TH VISIT.    PT Start Time  [phone]    PT Stop Time  1044    PT Time Calculation (min)  53 min    Activity Tolerance  Patient tolerated treatment well    Behavior During Therapy  WFL for tasks assessed/performed       Past Medical History:  Diagnosis Date  . Anemia   . Arthritis   . Asthma   . CKD (chronic kidney disease), stage III ([place])   . Hyperlipidemia   . Hypertension   . Hypothyroidism   . Nodule of right lung    Right upper lobe  . Renal insufficiency    Chronic  . Renal vascular disease   . Right renal artery stenosis ([place]) 05/09/2015  . Vertigo     Past Surgical History:  Procedure Laterality Date  . IR GENERIC HISTORICAL  08/29/2016   IR US GUIDE VASC ACCESS RIGHT 08/29/2016 [facility]  . IR GENERIC HISTORICAL  08/29/2016   IR RENAL BILAT S&I MOD SED 08/29/2016 [facility]  . THYROIDECTOMY, PARTIAL Right 1981   middle lobe removed 1st, then right lobe removed 7-8 years later (approx. 1988)    There were no vitals filed for this visit.  Subjective Assessment - 12/22/17 1059    Subjective  Walking some around house without walker.    Currently in Pain?  Yes    Pain Score  2     Pain Location  Knee    Pain Orientation  Right    Pain Descriptors / Indicators  Discomfort    Pain Onset  More than a month ago                       [facility] Adult PT Treatment/Exercise - 12/22/17 0001      Exercises   Exercises  Knee/Hip      Knee/Hip Exercises: Aerobic   Nustep  Level 4  x 16 minutes moving forward x 3 to increase knee flexion.      Knee/Hip Exercises: Supine   Short Arc Quad Sets Limitations  SAQ's with 1# x 12 minutes facilitated with VMS to right quadriceps muscle group with 10 sec extension holds and 10 sec rest.      Modalities   Modalities  Electrical Stimulation;Vasopneumatic      Electrical Stimulation   Electrical Stimulation Location  Right knee.    Electrical Stimulation Action  IFC    Electrical Stimulation Parameters  1-10 Hz x 15 minutes.    Electrical Stimulation Goals  Edema;Pain      Vasopneumatic   Number Minutes Vasopneumatic   15 minutes    Vasopnuematic Location   -- Right knee.    Vasopneumatic Pressure  Medium               PT Short Term Goals - 12/01/17 1453      PT SHORT TERM GOAL #1   Title  STG's=LTG's.  PT Long Term Goals - 12/15/17 1037      PT LONG TERM GOAL #1   Title  Independent with a HEP.    Time  4    Period  Weeks    Status  On-going      PT LONG TERM GOAL #2   Title  Full right active knee extension in order to normalize gait.    Time  4    Period  Weeks    Status  On-going      PT LONG TERM GOAL #3   Title  Active knee flexion to 115 degrees+ so the patient can perform functional tasks and do so with pain not > 2-3/10.    Time  4    Period  Weeks    Status  On-going      PT LONG TERM GOAL #4   Title  Increase right knee strength to a solid 4+/5 to provide good stability for accomplishment of functional activities.    Time  4    Period  Weeks    Status  On-going      PT LONG TERM GOAL #5   Title  Perform ADL's with pain not > 2-3/10.    Time  4    Period  Weeks    Status  On-going            Plan - 12/22/17 1014    Clinical Impression Statement  Patient is doing well and walking around the house without her walker.  Her edema is reduced by 2 cms.  Her active right knee extension is -10 degrees but nearly full passively.  Her right knee flexion actively is 115  degrees and passive= 120 degrees.    PT Treatment/Interventions  ADLs/Self Care Home Management;Cryotherapy;Electrical Stimulation;Ultrasound;Therapeutic activities;Therapeutic exercise;Patient/family education;Neuromuscular re-education;Manual techniques;Passive range of motion;Vasopneumatic Device    PT Next Visit Plan  PAIN-FREE right quadriceps strengthening; Nustep; ROM/stretching; vasopnumatic and electrical stimulation.    Consulted and Agree with Plan of Care  Patient       Patient will benefit from skilled therapeutic intervention in order to improve the following deficits and impairments:  Decreased activity tolerance, Decreased range of motion, Decreased strength, Increased edema, Pain  Visit Diagnosis: Acute pain of left knee  Stiffness of left knee, not elsewhere classified  Localized edema  Muscle weakness (generalized)     Problem List Patient Active Problem List   Diagnosis Date Noted  . Hematoma 11/27/2016  . Severe epistaxis 11/23/2016  . Vertigo 09/09/2016  . Renal vascular disease 10/31/2015  . Nodule of right lung 10/31/2015  . Asthma without status asthmaticus 10/31/2015  . Essential hypertension, malignant   . Hypothyroidism 10/15/2015  . Pancytopenia ([place]) 10/15/2015  . Hyponatremia 10/14/2015  . Thrombocytopenia ([place]) 06/16/2015  . Right renal artery stenosis ([place]) 05/09/2015  . Chronic diastolic heart failure ([place]) 04/03/2014  . Hypertensive urgency 04/02/2014  . Headache 04/02/2014  . CKD (chronic kidney disease) stage 3, GFR 30-59 ml/min (HCC) 04/02/2014  . Paroxysmal atrial fibrillation ([place]) 01/25/2014  . Hypokalemia 07/19/2013  . Anemia 07/19/2013  . Bilateral lower extremity edema 06/30/2013  . Hypertension with fluid overload 06/30/2013  . Pedal edema 05/10/2013  . Varicose veins of lower extremities with other complications 24/23/5361  . ABNORMAL STRESS ELECTROCARDIOGRAM 01/23/2010  . Hyperlipidemia 12/18/2009  . Essential  hypertension 12/18/2009  . RENAL INSUFFICIENCY, CHRONIC 12/18/2009  . Arthropathy 12/18/2009    APP[place], [place] MPT 12/22/2017, 11:05 AM  [facility] [address]  [place], [place], [address] Phone: [phone]   Fax:  [phone]  Name: MANDA HOLSTAD MRN: [id number] Date of Birth: April 28, 1934

## 2017-12-24 ENCOUNTER — Ambulatory Visit: Payer: PPO | Admitting: Physical Therapy

## 2017-12-24 ENCOUNTER — Encounter: Payer: Self-pay | Admitting: Physical Therapy

## 2017-12-24 DIAGNOSIS — M6281 Muscle weakness (generalized): Secondary | ICD-10-CM

## 2017-12-24 DIAGNOSIS — M25662 Stiffness of left knee, not elsewhere classified: Secondary | ICD-10-CM

## 2017-12-24 DIAGNOSIS — M25562 Pain in left knee: Secondary | ICD-10-CM | POA: Diagnosis not present

## 2017-12-24 DIAGNOSIS — R600 Localized edema: Secondary | ICD-10-CM

## 2017-12-24 NOTE — Therapy (Addendum)
[facility] [place], [place], [address] Phone: [phone]   Fax:  [phone]  Physical Therapy Treatment  Patient Details  Name: [name] MRN: [id number] Date of Birth: 04/10/1934 Referring Provider: Esmond Plants MD.   Encounter Date: 12/24/2017  PT End of Session - 12/24/17 0955    Visit Number  8    Number of Visits  20 per NO from MD for 2-3x/week for 4 weeks    Date for PT Re-Evaluation  01/20/18    Authorization Type  FOTO AT LEAST EVERY 5TH VISIT.    PT Start Time  [phone]    PT Stop Time  1046    PT Time Calculation (min)  58 min    Activity Tolerance  Patient tolerated treatment well    Behavior During Therapy  WFL for tasks assessed/performed       Past Medical History:  Diagnosis Date  . Anemia   . Arthritis   . Asthma   . CKD (chronic kidney disease), stage III ([place])   . Hyperlipidemia   . Hypertension   . Hypothyroidism   . Nodule of right lung    Right upper lobe  . Renal insufficiency    Chronic  . Renal vascular disease   . Right renal artery stenosis ([place]) 05/09/2015  . Vertigo     Past Surgical History:  Procedure Laterality Date  . IR GENERIC HISTORICAL  08/29/2016   IR US GUIDE VASC ACCESS RIGHT 08/29/2016 [facility]  . IR GENERIC HISTORICAL  08/29/2016   IR RENAL BILAT S&I MOD SED 08/29/2016 [facility]  . THYROIDECTOMY, PARTIAL Right 1981   middle lobe removed 1st, then right lobe removed 7-8 years later (approx. 1988)    There were no vitals filed for this visit.  Subjective Assessment - 12/24/17 0953    Subjective  Reports that the MD told her to start using her cane now. MD order says more gait training, work on extension and quad strengthening.    Pertinent History  HTN    Limitations  Walking    How long can you walk comfortably?  Short distances around home.    Patient Stated Goals  Get back to normal.    Currently in Pain?  Other (Comment) No pain assessment  provided by patient         [facility] PT Assessment - 12/24/17 0001      Assessment   Medical Diagnosis  Left knee arthroscopic surgery.    Onset Date/Surgical Date  11/18/17    Next MD Visit  01/19/2018      Precautions   Precaution Comments  PAIN-FREE LEFT QUADRICEPS STRENGTHENING.      Restrictions   Weight Bearing Restrictions  No                   [facility] Adult PT Treatment/Exercise - 12/24/17 0001      Ambulation/Gait   Ambulation/Gait  Yes    Ambulation/Gait Assistance  5: Supervision    Ambulation Distance (Feet)  73 Feet    Assistive device  Straight cane    Gait Pattern  Step-through pattern;Decreased arm swing - right;Decreased stride length;Narrow base of support      Knee/Hip Exercises: Aerobic   Nustep  Level 4 x 16 minutes moving forward x 3 to increase knee flexion.      Knee/Hip Exercises: Standing   Lateral Step Up  Right;2 sets;10 reps;Hand Hold: 2;Step Height: 4"    Forward Step  Up  Right;2 sets;10 reps;Hand Hold: 2;Step Height: 4"      Knee/Hip Exercises: Seated   Long Arc Quad  Strengthening;Right;2 sets;10 reps;Weights    Long Arc Quad Weight  3 lbs.    Hamstring Curl  Strengthening;Right;20 reps;Limitations    Hamstring Limitations  red theraband      Knee/Hip Exercises: Supine   Short Arc Quad Sets  Strengthening;Right;Limitations    Short Arc Quad Sets Limitations  VMS for [employer] of R quad      Modalities   Modalities  Artist  R VMO/ quad     Printmaker Action  VMS     Electrical Stimulation Parameters  10/10, 50 pps, 5 sec ramp, 300 usec x10 min    Electrical Stimulation Goals  Neuromuscular facilitation      Vasopneumatic   Number Minutes Vasopneumatic   15 minutes    Vasopnuematic Location   Knee    Vasopneumatic Pressure  Medium    Vasopneumatic Temperature   34               PT Short Term Goals - 12/01/17 1453       PT SHORT TERM GOAL #1   Title  STG's=LTG's.        PT Long Term Goals - 12/15/17 1037      PT LONG TERM GOAL #1   Title  Independent with a HEP.    Time  4    Period  Weeks    Status  On-going      PT LONG TERM GOAL #2   Title  Full right active knee extension in order to normalize gait.    Time  4    Period  Weeks    Status  On-going      PT LONG TERM GOAL #3   Title  Active knee flexion to 115 degrees+ so the patient can perform functional tasks and do so with pain not > 2-3/10.    Time  4    Period  Weeks    Status  On-going      PT LONG TERM GOAL #4   Title  Increase right knee strength to a solid 4+/5 to provide good stability for accomplishment of functional activities.    Time  4    Period  Weeks    Status  On-going      PT LONG TERM GOAL #5   Title  Perform ADL's with pain not > 2-3/10.    Time  4    Period  Weeks    Status  On-going            Plan - 12/24/17 1127    Clinical Impression Statement  Patient tolerated today's treatment well although still deficient with R quad activity. Patient instructed mininally to correct gait sequencing. Functional strength training completed with forward and lateral step ups with only minimal discomfort in inferiomedial R knee with forward step ups. Deficiency still noted in R quad with both LAQ and SAQ. VMS continued with 3# ankleweight without complaint from patient. Normal modalities response noted following removal of the modalities.     Rehab Potential  Good    PT Frequency  2x / week    PT Duration  4 weeks    PT Treatment/Interventions  ADLs/Self Care Home Management;Cryotherapy;Electrical Stimulation;Ultrasound;Therapeutic activities;Therapeutic exercise;Patient/family education;Neuromuscular re-education;Manual techniques;Passive range of motion;Vasopneumatic Device    PT Next Visit Plan  PAIN-FREE right quadriceps strengthening; Nustep; ROM/stretching; vasopnumatic and electrical stimulation.    Consulted  and Agree with Plan of Care  Patient       Patient will benefit from skilled therapeutic intervention in order to improve the following deficits and impairments:  Decreased activity tolerance, Decreased range of motion, Decreased strength, Increased edema, Pain  Visit Diagnosis: Acute pain of left knee  Stiffness of left knee, not elsewhere classified  Localized edema  Muscle weakness (generalized)     Problem List Patient Active Problem List   Diagnosis Date Noted  . Hematoma 11/27/2016  . Severe epistaxis 11/23/2016  . Vertigo 09/09/2016  . Renal vascular disease 10/31/2015  . Nodule of right lung 10/31/2015  . Asthma without status asthmaticus 10/31/2015  . Essential hypertension, malignant   . Hypothyroidism 10/15/2015  . Pancytopenia ([place]) 10/15/2015  . Hyponatremia 10/14/2015  . Thrombocytopenia ([place]) 06/16/2015  . Right renal artery stenosis ([place]) 05/09/2015  . Chronic diastolic heart failure ([place]) 04/03/2014  . Hypertensive urgency 04/02/2014  . Headache 04/02/2014  . CKD (chronic kidney disease) stage 3, GFR 30-59 ml/min (HCC) 04/02/2014  . Paroxysmal atrial fibrillation ([place]) 01/25/2014  . Hypokalemia 07/19/2013  . Anemia 07/19/2013  . Bilateral lower extremity edema 06/30/2013  . Hypertension with fluid overload 06/30/2013  . Pedal edema 05/10/2013  . Varicose veins of lower extremities with other complications 50/53/9767  . ABNORMAL STRESS ELECTROCARDIOGRAM 01/23/2010  . Hyperlipidemia 12/18/2009  . Essential hypertension 12/18/2009  . RENAL INSUFFICIENCY, CHRONIC 12/18/2009  . Arthropathy 12/18/2009    Standley Brooking, PTA 12/24/2017, 11:33 AM  [facility] [address] [place], [place], [address] Phone: [phone]   Fax:  [phone]  Name: [name] MRN: [id number] Date of Birth: July 13, 1934

## 2017-12-29 ENCOUNTER — Encounter: Payer: Self-pay | Admitting: Physical Therapy

## 2017-12-29 ENCOUNTER — Ambulatory Visit: Payer: PPO | Admitting: Physical Therapy

## 2017-12-29 DIAGNOSIS — M6281 Muscle weakness (generalized): Secondary | ICD-10-CM

## 2017-12-29 DIAGNOSIS — M25562 Pain in left knee: Secondary | ICD-10-CM | POA: Diagnosis not present

## 2017-12-29 DIAGNOSIS — M25662 Stiffness of left knee, not elsewhere classified: Secondary | ICD-10-CM

## 2017-12-29 DIAGNOSIS — R600 Localized edema: Secondary | ICD-10-CM

## 2017-12-29 NOTE — Therapy (Signed)
[facility] [place], [place], [address] Phone: [phone]   Fax:  [phone]  Physical Therapy Treatment  Patient Details  Name: [name] MRN: [id number] Date of Birth: [birth year] Referring Provider: Esmond Plants MD.   Encounter Date: 12/29/2017  PT End of Session - 12/29/17 1003    Visit Number  9    Number of Visits  20    Date for PT Re-Evaluation  01/20/18    Authorization Type  FOTO AT LEAST EVERY 5TH VISIT.    PT Start Time  [phone]    PT Stop Time  1050    PT Time Calculation (min)  62 min    Activity Tolerance  Patient tolerated treatment well    Behavior During Therapy  WFL for tasks assessed/performed       Past Medical History:  Diagnosis Date  . Anemia   . Arthritis   . Asthma   . CKD (chronic kidney disease), stage III ([place])   . Hyperlipidemia   . Hypertension   . Hypothyroidism   . Nodule of right lung    Right upper lobe  . Renal insufficiency    Chronic  . Renal vascular disease   . Right renal artery stenosis ([place]) 05/09/2015  . Vertigo     Past Surgical History:  Procedure Laterality Date  . IR GENERIC HISTORICAL  08/29/2016   IR US GUIDE VASC ACCESS RIGHT 08/29/2016 [facility]  . IR GENERIC HISTORICAL  08/29/2016   IR RENAL BILAT S&I MOD SED 08/29/2016 [facility]  . THYROIDECTOMY, PARTIAL Right 1981   middle lobe removed 1st, then right lobe removed 7-8 years later (approx. 1988)    There were no vitals filed for this visit.  Subjective Assessment - 12/29/17 0950    Subjective  Reports that she at times feels more heat along inferior R knee and that medial R knee still doesn't feel normal. Reports that she has been told by others that they were healed by 6 weeks.    Pertinent History  HTN    Limitations  Walking    How long can you walk comfortably?  Short distances around home.    Patient Stated Goals  Get back to normal.    Currently in Pain?  Yes    Pain Score  -- "a  little"    Pain Location  Knee    Pain Orientation  Right;Distal    Pain Descriptors / Indicators  Discomfort    Pain Type  Surgical pain    Pain Onset  More than a month ago         [facility] PT Assessment - 12/29/17 0001      Assessment   Medical Diagnosis  Left knee arthroscopic surgery.    Onset Date/Surgical Date  11/18/17    Next MD Visit  01/19/2018      Precautions   Precaution Comments  PAIN-FREE LEFT QUADRICEPS STRENGTHENING.      Restrictions   Weight Bearing Restrictions  No      ROM / Strength   AROM / PROM / Strength  AROM      AROM   Overall AROM   Deficits    AROM Assessment Site  Knee    Right/Left Knee  Right    Right Knee Extension  -4    Right Knee Flexion  120                   [facility] Adult PT  Treatment/Exercise - 12/29/17 0001      Knee/Hip Exercises: Aerobic   Nustep  L5 x15 min      Knee/Hip Exercises: Standing   Heel Raises  Both;20 reps B toe raise x20 reps    Forward Lunges  Right;2 sets;10 reps;2 seconds    Side Lunges  Right;2 sets;10 reps;2 seconds    Hip Abduction  AROM;Right;2 sets;10 reps;Knee straight    Lateral Step Up  Right;2 sets;10 reps;Hand Hold: 2;Step Height: 4"    Forward Step Up  Right;2 sets;10 reps;Hand Hold: 2;Step Height: 4"    Step Down  Right;2 sets;10 reps;Hand Hold: 2;Step Height: 6"      Knee/Hip Exercises: Seated   Sit to Sand  20 reps;without UE support      Knee/Hip Exercises: Supine   Short Arc Quad Sets  Right;Limitations    Short Arc Quad Sets Limitations  VMS for [employer]      Modalities   Modalities  Artist  R VMO, Tax adviser  VMS with SAQ    Electrical Stimulation Parameters  10/10, 50 pps, 5 sec ramp, 300 usec x15 min    Electrical Stimulation Goals  Neuromuscular facilitation      Vasopneumatic   Number Minutes Vasopneumatic   10 minutes    Vasopnuematic Location   Knee     Vasopneumatic Pressure  Medium    Vasopneumatic Temperature   34               PT Short Term Goals - 12/01/17 1453      PT SHORT TERM GOAL #1   Title  STG's=LTG's.        PT Long Term Goals - 12/29/17 1104      PT LONG TERM GOAL #1   Title  Independent with a HEP.    Time  4    Period  Weeks    Status  On-going      PT LONG TERM GOAL #2   Title  Full right active knee extension in order to normalize gait.    Time  4    Period  Weeks    Status  On-going -4 deg from R knee neutral 12/29/2017      PT LONG TERM GOAL #3   Title  Active knee flexion to 115 degrees+ so the patient can perform functional tasks and do so with pain not > 2-3/10.    Time  4    Period  Weeks    Status  Achieved 120 deg R knee flexion 12/29/2017      PT LONG TERM GOAL #4   Title  Increase right knee strength to a solid 4+/5 to provide good stability for accomplishment of functional activities.    Time  4    Period  Weeks    Status  On-going      PT LONG TERM GOAL #5   Title  Perform ADL's with pain not > 2-3/10.    Time  4    Period  Weeks    Status  On-going            Plan - 12/29/17 1033    Clinical Impression Statement  Patient tolerated today's treatment well as more step up activties completed along with more standing exercises. AROM of R knee has improved greatly in the last two weeks to 4-120 deg. Lack of voluntary R quad  activation still limiting strengthening and extension ROM. VMS with SAQ continued today to further increase neuro re-ed of R qaud. Minimally increased temperature upon assessment of R knee and minimally increased edema present in R knee upon comparison to L knee. Patient educate to monitor the symptoms but currently denies any other fever or skin redness surrounding R knee. Normal modalities response noted following removal of the modalities.    Rehab Potential  Good    PT Frequency  2x / week    PT Duration  4 weeks    PT Treatment/Interventions   ADLs/Self Care Home Management;Cryotherapy;Electrical Stimulation;Ultrasound;Therapeutic activities;Therapeutic exercise;Patient/family education;Neuromuscular re-education;Manual techniques;Passive range of motion;Vasopneumatic Device    PT Next Visit Plan  PAIN-FREE right quadriceps strengthening and modalities PRN.    Consulted and Agree with Plan of Care  Patient       Patient will benefit from skilled therapeutic intervention in order to improve the following deficits and impairments:  Decreased activity tolerance, Decreased range of motion, Decreased strength, Increased edema, Pain  Visit Diagnosis: Stiffness of left knee, not elsewhere classified  Acute pain of left knee  Localized edema  Muscle weakness (generalized)     Problem List Patient Active Problem List   Diagnosis Date Noted  . Hematoma 11/27/2016  . Severe epistaxis 11/23/2016  . Vertigo 09/09/2016  . Renal vascular disease 10/31/2015  . Nodule of right lung 10/31/2015  . Asthma without status asthmaticus 10/31/2015  . Essential hypertension, malignant   . Hypothyroidism 10/15/2015  . Pancytopenia ([place]) 10/15/2015  . Hyponatremia 10/14/2015  . Thrombocytopenia ([place]) 06/16/2015  . Right renal artery stenosis ([place]) 05/09/2015  . Chronic diastolic heart failure ([place]) 04/03/2014  . Hypertensive urgency 04/02/2014  . Headache 04/02/2014  . CKD (chronic kidney disease) stage 3, GFR 30-59 ml/min ([facility]) 04/02/2014  . Paroxysmal atrial fibrillation ([place]) 01/25/2014  . Hypokalemia 07/19/2013  . Anemia 07/19/2013  . Bilateral lower extremity edema 06/30/2013  . Hypertension with fluid overload 06/30/2013  . Pedal edema 05/10/2013  . Varicose veins of lower extremities with other complications 44/81/8563  . ABNORMAL STRESS ELECTROCARDIOGRAM 01/23/2010  . Hyperlipidemia 12/18/2009  . Essential hypertension 12/18/2009  . RENAL INSUFFICIENCY, CHRONIC 12/18/2009  . Arthropathy 12/18/2009    Standley Brooking,  PTA 12/29/2017, 11:05 AM  [facility] [address] [place], [place], [address] Phone: [phone]   Fax:  [phone]  Name: [name] MRN: [id number] Date of Birth: 1934/06/17

## 2017-12-31 ENCOUNTER — Ambulatory Visit: Payer: PPO | Admitting: Physical Therapy

## 2017-12-31 ENCOUNTER — Encounter: Payer: Self-pay | Admitting: Physical Therapy

## 2017-12-31 DIAGNOSIS — M25662 Stiffness of left knee, not elsewhere classified: Secondary | ICD-10-CM

## 2017-12-31 DIAGNOSIS — M25562 Pain in left knee: Secondary | ICD-10-CM | POA: Diagnosis not present

## 2017-12-31 DIAGNOSIS — R600 Localized edema: Secondary | ICD-10-CM

## 2017-12-31 DIAGNOSIS — M6281 Muscle weakness (generalized): Secondary | ICD-10-CM

## 2017-12-31 NOTE — Therapy (Signed)
[facility] [place], [place], [address] Phone: [phone]   Fax:  [phone]  Physical Therapy Treatment  Patient Details  Name: [name] MRN: [id number] Date of Birth: 02/09/1934 Referring Provider: Esmond Plants MD.   Encounter Date: 12/31/2017  PT End of Session - 12/31/17 1019    Visit Number  10    Number of Visits  20    Date for PT Re-Evaluation  01/20/18    Authorization Type  FOTO AT LEAST EVERY 5TH VISIT.    PT Start Time  0945    PT Stop Time  1044    PT Time Calculation (min)  59 min    Activity Tolerance  Patient tolerated treatment well    Behavior During Therapy  WFL for tasks assessed/performed       Past Medical History:  Diagnosis Date  . Anemia   . Arthritis   . Asthma   . CKD (chronic kidney disease), stage III ([place])   . Hyperlipidemia   . Hypertension   . Hypothyroidism   . Nodule of right lung    Right upper lobe  . Renal insufficiency    Chronic  . Renal vascular disease   . Right renal artery stenosis ([place]) 05/09/2015  . Vertigo     Past Surgical History:  Procedure Laterality Date  . IR GENERIC HISTORICAL  08/29/2016   IR US GUIDE VASC ACCESS RIGHT 08/29/2016 [facility]  . IR GENERIC HISTORICAL  08/29/2016   IR RENAL BILAT S&I MOD SED 08/29/2016 [facility]  . THYROIDECTOMY, PARTIAL Right 1981   middle lobe removed 1st, then right lobe removed 7-8 years later (approx. 1988)    There were no vitals filed for this visit.  Subjective Assessment - 12/31/17 0954    Subjective  Patient arrived with no new complaints    Pertinent History  HTN    Limitations  Walking    How long can you walk comfortably?  Short distances around home.    Patient Stated Goals  Get back to normal.    Currently in Pain?  Yes    Pain Score  2     Pain Location  Knee    Pain Orientation  Right    Pain Descriptors / Indicators  Discomfort    Pain Type  Surgical pain    Pain Onset  More than  a month ago    Pain Frequency  Constant    Aggravating Factors   prolong activity    Pain Relieving Factors  at rest         [facility] PT Assessment - 12/31/17 0001      AROM   AROM Assessment Site  Knee    Right/Left Knee  Right    Right Knee Extension  -4      PROM   PROM Assessment Site  Knee    Right/Left Knee  Right    Right Knee Extension  0                   [facility] Adult PT Treatment/Exercise - 12/31/17 0001      Knee/Hip Exercises: Aerobic   Nustep  L5 x15 min UE/LE activity      Knee/Hip Exercises: Standing   Lateral Step Up  Right;2 sets;10 reps;Hand Hold: 2;Step Height: 4"    Forward Step Up  Right;2 sets;10 reps;Hand Hold: 2;Step Height: 4"    Step Down  Right;2 sets;10 reps;Hand Hold: 2;Step Height: 6"  Rocker Board  2 minutes      Knee/Hip Exercises: Supine   Bridges with [employer]  Strengthening;Both;2 sets;10 reps    Straight Leg Raises  Strengthening;Right;2 sets;10 reps      Acupuncturist Location  R VMO, Tax adviser  VMS with Chief Executive Officer Parameters  10/10 x91mn    Electrical Stimulation Goals  Neuromuscular facilitation      Vasopneumatic   Number Minutes Vasopneumatic   10 minutes    Vasopnuematic Location   Knee    Vasopneumatic Pressure  Medium      Manual Therapy   Manual Therapy  Passive ROM    Passive ROM  gentle manual PROM for right knee ext with low load holds               PT Short Term Goals - 12/01/17 1453      PT SHORT TERM GOAL #1   Title  STG's=LTG's.        PT Long Term Goals - 12/29/17 1104      PT LONG TERM GOAL #1   Title  Independent with a HEP.    Time  4    Period  Weeks    Status  On-going      PT LONG TERM GOAL #2   Title  Full right active knee extension in order to normalize gait.    Time  4    Period  Weeks    Status  On-going -4 deg from R knee neutral 12/29/2017      PT LONG TERM GOAL #3   Title   Active knee flexion to 115 degrees+ so the patient can perform functional tasks and do so with pain not > 2-3/10.    Time  4    Period  Weeks    Status  Achieved 120 deg R knee flexion 12/29/2017      PT LONG TERM GOAL #4   Title  Increase right knee strength to a solid 4+/5 to provide good stability for accomplishment of functional activities.    Time  4    Period  Weeks    Status  On-going      PT LONG TERM GOAL #5   Title  Perform ADL's with pain not > 2-3/10.    Time  4    Period  Weeks    Status  On-going            Plan - 12/31/17 1036    Clinical Impression Statement  Patient tolerated treatment well today. Patient progressing with right knee strengthening exercises. Patient has limitations with knee ext today, educated patient on self stretches. Patient has ongoing edema yet limited pain reported. Patient able to perform light ADL's with greater ease. Goals progressing.     Clinical Presentation due to:  FOTO 10th 43% limitation    Rehab Potential  Good    PT Frequency  2x / week    PT Duration  4 weeks    PT Treatment/Interventions  ADLs/Self Care Home Management;Cryotherapy;Electrical Stimulation;Ultrasound;Therapeutic activities;Therapeutic exercise;Patient/family education;Neuromuscular re-education;Manual techniques;Passive range of motion;Vasopneumatic Device    PT Next Visit Plan  PAIN-FREE right quadriceps strengthening and modalities PRN.    Consulted and Agree with Plan of Care  Patient       Patient will benefit from skilled therapeutic intervention in order to improve the following deficits and impairments:  Decreased activity tolerance, Decreased range of  motion, Decreased strength, Increased edema, Pain  Visit Diagnosis: Stiffness of left knee, not elsewhere classified  Acute pain of left knee  Localized edema  Muscle weakness (generalized)     Problem List Patient Active Problem List   Diagnosis Date Noted  . Hematoma 11/27/2016  . Severe  epistaxis 11/23/2016  . Vertigo 09/09/2016  . Renal vascular disease 10/31/2015  . Nodule of right lung 10/31/2015  . Asthma without status asthmaticus 10/31/2015  . Essential hypertension, malignant   . Hypothyroidism 10/15/2015  . Pancytopenia ([place]) 10/15/2015  . Hyponatremia 10/14/2015  . Thrombocytopenia ([employer]) 06/16/2015  . Right renal artery stenosis ([place]) 05/09/2015  . Chronic diastolic heart failure ([place]) 04/03/2014  . Hypertensive urgency 04/02/2014  . Headache 04/02/2014  . CKD (chronic kidney disease) stage 3, GFR 30-59 ml/min ([facility]) 04/02/2014  . Paroxysmal atrial fibrillation ([place]) 01/25/2014  . Hypokalemia 07/19/2013  . Anemia 07/19/2013  . Bilateral lower extremity edema 06/30/2013  . Hypertension with fluid overload 06/30/2013  . Pedal edema 05/10/2013  . Varicose veins of lower extremities with other complications 30/12/1100  . ABNORMAL STRESS ELECTROCARDIOGRAM 01/23/2010  . Hyperlipidemia 12/18/2009  . Essential hypertension 12/18/2009  . RENAL INSUFFICIENCY, CHRONIC 12/18/2009  . Arthropathy 12/18/2009    Ladean Raya, PTA 12/31/17 10:44 AM  [facility] [address] [place], [place], [address] Phone: [phone]   Fax:  [phone]  Name: [name] MRN: [id number] Date of Birth: 04/20/1934  Progress Note Reporting Period 12/01/17 to 12/31/17  See note below for Objective Data and Assessment of Progress/Goals.    Patient is making very good progress and has met goal #3 (see above please).  Her pain rating is low now.  Mali Applegate MPT

## 2018-01-05 ENCOUNTER — Ambulatory Visit: Payer: PPO | Admitting: *Deleted

## 2018-01-05 DIAGNOSIS — M25662 Stiffness of left knee, not elsewhere classified: Secondary | ICD-10-CM

## 2018-01-05 DIAGNOSIS — M6281 Muscle weakness (generalized): Secondary | ICD-10-CM

## 2018-01-05 DIAGNOSIS — R600 Localized edema: Secondary | ICD-10-CM

## 2018-01-05 DIAGNOSIS — M25562 Pain in left knee: Secondary | ICD-10-CM

## 2018-01-05 NOTE — Therapy (Signed)
[facility] [place], [place], [address] Phone: [phone]   Fax:  [phone]  Physical Therapy Treatment  Patient Details  Name: [name] MRN: [id number] Date of Birth: Jan 02, 1934 Referring Provider: Esmond Plants MD.   Encounter Date: 01/05/2018  PT End of Session - 01/05/18 0954    Visit Number  11    Number of Visits  20    Date for PT Re-Evaluation  01/20/18    Authorization Type  FOTO AT LEAST EVERY 5TH VISIT.    PT Start Time  0945    PT Stop Time  1042    PT Time Calculation (min)  57 min       Past Medical History:  Diagnosis Date  . Anemia   . Arthritis   . Asthma   . CKD (chronic kidney disease), stage III ([place])   . Hyperlipidemia   . Hypertension   . Hypothyroidism   . Nodule of right lung    Right upper lobe  . Renal insufficiency    Chronic  . Renal vascular disease   . Right renal artery stenosis ([place]) 05/09/2015  . Vertigo     Past Surgical History:  Procedure Laterality Date  . IR GENERIC HISTORICAL  08/29/2016   IR US GUIDE VASC ACCESS RIGHT 08/29/2016 [facility]  . IR GENERIC HISTORICAL  08/29/2016   IR RENAL BILAT S&I MOD SED 08/29/2016 [facility]  . THYROIDECTOMY, PARTIAL Right 1981   middle lobe removed 1st, then right lobe removed 7-8 years later (approx. 1988)    There were no vitals filed for this visit.  Subjective Assessment - 01/05/18 0952    Subjective  Patient arrived with no new complaints. Knee stays sore    Pertinent History  HTN    Limitations  Walking    How long can you walk comfortably?  Short distances around home.    Patient Stated Goals  Get back to normal.    Currently in Pain?  Yes    Pain Score  2     Pain Location  Knee    Pain Orientation  Right    Pain Type  Surgical pain    Pain Onset  More than a month ago                       [facility] Adult PT Treatment/Exercise - 01/05/18 0001      Knee/Hip Exercises: Aerobic   Nustep   L5 x15 min UE/LE activity      Knee/Hip Exercises: Standing   Lateral Step Up  Right;2 sets;10 reps;Hand Hold: 2;Step Height: 4"    Forward Step Up  Right;2 sets;10 reps;Hand Hold: 2;Step Height: 4"    Step Down  Right;2 sets;10 reps;Hand Hold: 2;Step Height: 6"    Rocker Board  2 minutes      Knee/Hip Exercises: Seated   Long Arc Quad  Strengthening;Right;3 sets;10 reps    Long Arc Quad Weight  3 lbs.      Knee/Hip Exercises: Supine   Short Arc Quad Sets  Right;AROM with VMS      Modalities   Modalities  Psychologist, educational Location  R VMO, Occupational hygienist Action  VMS x10 mins 10 secs on/off    Electrical Stimulation Goals  Neuromuscular facilitation      Vasopneumatic   Number Minutes Vasopneumatic   10 minutes  Vasopnuematic Location   Knee    Vasopneumatic Pressure  Medium    Vasopneumatic Temperature   34      Manual Therapy   Manual Therapy  Passive ROM    Passive ROM  gentle manual PROM for right knee ext with low load holds flexion to 120 degrees               PT Short Term Goals - 12/01/17 1453      PT SHORT TERM GOAL #1   Title  STG's=LTG's.        PT Long Term Goals - 12/29/17 1104      PT LONG TERM GOAL #1   Title  Independent with a HEP.    Time  4    Period  Weeks    Status  On-going      PT LONG TERM GOAL #2   Title  Full right active knee extension in order to normalize gait.    Time  4    Period  Weeks    Status  On-going -4 deg from R knee neutral 12/29/2017      PT LONG TERM GOAL #3   Title  Active knee flexion to 115 degrees+ so the patient can perform functional tasks and do so with pain not > 2-3/10.    Time  4    Period  Weeks    Status  Achieved 120 deg R knee flexion 12/29/2017      PT LONG TERM GOAL #4   Title  Increase right knee strength to a solid 4+/5 to provide good stability for accomplishment of functional activities.    Time   4    Period  Weeks    Status  On-going      PT LONG TERM GOAL #5   Title  Perform ADL's with pain not > 2-3/10.    Time  4    Period  Weeks    Status  On-going            Plan - 01/05/18 1042    Clinical Impression Statement  Pt arrived today doing fairly well with RT knee. She continues to progress with  RT knee strengthening and quad control with minimal complaints. Her flexion ROM was to 120 degrees.    Clinical Presentation  Stable    Clinical Decision Making  Low    Rehab Potential  Good    PT Frequency  2x / week    PT Duration  4 weeks    PT Treatment/Interventions  ADLs/Self Care Home Management;Cryotherapy;Electrical Stimulation;Ultrasound;Therapeutic activities;Therapeutic exercise;Patient/family education;Neuromuscular re-education;Manual techniques;Passive range of motion;Vasopneumatic Device    PT Next Visit Plan  PAIN-FREE right quadriceps strengthening and modalities PRN.    Consulted and Agree with Plan of Care  Patient       Patient will benefit from skilled therapeutic intervention in order to improve the following deficits and impairments:  Decreased activity tolerance, Decreased range of motion, Decreased strength, Increased edema, Pain  Visit Diagnosis: Stiffness of left knee, not elsewhere classified  Acute pain of left knee  Localized edema  Muscle weakness (generalized)     Problem List Patient Active Problem List   Diagnosis Date Noted  . Hematoma 11/27/2016  . Severe epistaxis 11/23/2016  . Vertigo 09/09/2016  . Renal vascular disease 10/31/2015  . Nodule of right lung 10/31/2015  . Asthma without status asthmaticus 10/31/2015  . Essential hypertension, malignant   . Hypothyroidism 10/15/2015  . Pancytopenia ([place]) 10/15/2015  .  Hyponatremia 10/14/2015  . Thrombocytopenia ([place]) 06/16/2015  . Right renal artery stenosis ([place]) 05/09/2015  . Chronic diastolic heart failure ([place]) 04/03/2014  . Hypertensive urgency 04/02/2014  .  Headache 04/02/2014  . CKD (chronic kidney disease) stage 3, GFR 30-59 ml/min ([facility]) 04/02/2014  . Paroxysmal atrial fibrillation ([place]) 01/25/2014  . Hypokalemia 07/19/2013  . Anemia 07/19/2013  . Bilateral lower extremity edema 06/30/2013  . Hypertension with fluid overload 06/30/2013  . Pedal edema 05/10/2013  . Varicose veins of lower extremities with other complications 35/36/1443  . ABNORMAL STRESS ELECTROCARDIOGRAM 01/23/2010  . Hyperlipidemia 12/18/2009  . Essential hypertension 12/18/2009  . RENAL INSUFFICIENCY, CHRONIC 12/18/2009  . Arthropathy 12/18/2009    RAMSEUR,CHRIS, PTA 01/05/2018, 3:32 PM  [facility] [place], [place], [address] Phone: [phone]   Fax:  [phone]  Name: [name] MRN: [id number] Date of Birth: 06/21/1934

## 2018-01-07 ENCOUNTER — Ambulatory Visit: Payer: PPO | Admitting: Physical Therapy

## 2018-01-07 ENCOUNTER — Encounter: Payer: Self-pay | Admitting: Physical Therapy

## 2018-01-07 DIAGNOSIS — M25562 Pain in left knee: Secondary | ICD-10-CM | POA: Diagnosis not present

## 2018-01-07 DIAGNOSIS — M25662 Stiffness of left knee, not elsewhere classified: Secondary | ICD-10-CM

## 2018-01-07 DIAGNOSIS — M6281 Muscle weakness (generalized): Secondary | ICD-10-CM

## 2018-01-07 DIAGNOSIS — R600 Localized edema: Secondary | ICD-10-CM

## 2018-01-07 NOTE — Therapy (Signed)
[facility] [place], [place], [address] Phone: [phone]   Fax:  [phone]  Physical Therapy Treatment  Patient Details  Name: [name] MRN: [id number] Date of Birth: 21-Aug-1933 Referring Provider: Esmond Plants MD.   Encounter Date: 01/07/2018  PT End of Session - 01/07/18 0959    Visit Number  12    Number of Visits  20    Date for PT Re-Evaluation  01/20/18    Authorization Type  FOTO AT LEAST EVERY 5TH VISIT.    PT Start Time  [phone]    PT Stop Time  1053    PT Time Calculation (min)  64 min    Activity Tolerance  Patient tolerated treatment well    Behavior During Therapy  WFL for tasks assessed/performed       Past Medical History:  Diagnosis Date  . Anemia   . Arthritis   . Asthma   . CKD (chronic kidney disease), stage III ([place])   . Hyperlipidemia   . Hypertension   . Hypothyroidism   . Nodule of right lung    Right upper lobe  . Renal insufficiency    Chronic  . Renal vascular disease   . Right renal artery stenosis ([place]) 05/09/2015  . Vertigo     Past Surgical History:  Procedure Laterality Date  . IR GENERIC HISTORICAL  08/29/2016   IR US GUIDE VASC ACCESS RIGHT 08/29/2016 [facility]  . IR GENERIC HISTORICAL  08/29/2016   IR RENAL BILAT S&I MOD SED 08/29/2016 [facility]  . THYROIDECTOMY, PARTIAL Right 1981   middle lobe removed 1st, then right lobe removed 7-8 years later (approx. 1988)    There were no vitals filed for this visit.  Subjective Assessment - 01/07/18 0958    Subjective  Reports her knee feeling "about the same."    Pertinent History  HTN    Limitations  Walking    How long can you walk comfortably?  Short distances around home.    Patient Stated Goals  Get back to normal.    Currently in Pain?  Yes    Pain Score  2     Pain Location  Knee    Pain Orientation  Right    Pain Descriptors / Indicators  Discomfort;Tiring;Other (Comment) "weakness"    Pain Type   Surgical pain    Pain Onset  More than a month ago    Pain Frequency  Constant         [facility] PT Assessment - 01/07/18 0001      Assessment   Medical Diagnosis  Left knee arthroscopic surgery.    Onset Date/Surgical Date  11/18/17    Next MD Visit  01/19/2018      Precautions   Precaution Comments  PAIN-FREE LEFT QUADRICEPS STRENGTHENING.      Restrictions   Weight Bearing Restrictions  No                   [facility] Adult PT Treatment/Exercise - 01/07/18 0001      Knee/Hip Exercises: Stretches   Passive Hamstring Stretch  Right;3 reps;30 seconds      Knee/Hip Exercises: Aerobic   Nustep  L5 x15 min UE/LE activity      Knee/Hip Exercises: Standing   Terminal Knee Extension  Strengthening;Right;15 reps;Theraband    Theraband Level (Terminal Knee Extension)  Level 2 (Red)    Lateral Step Up  Right;2 sets;10 reps;Hand Hold: 2;Step Height: 4"  Forward Step Up  Right;2 sets;10 reps;Hand Hold: 2;Step Height: 4"    Step Down  Right;2 sets;10 reps;Hand Hold: 2;Step Height: 6"      Knee/Hip Exercises: Seated   Long Arc Quad  Strengthening;Right;3 sets;10 reps    Long Arc Quad Weight  4 lbs.      Modalities   Modalities  Software engineer Action  IFC    Electrical Stimulation Parameters  1-10 hz x15 min    Electrical Stimulation Goals  Edema;Pain      Vasopneumatic   Number Minutes Vasopneumatic   15 minutes    Vasopnuematic Location   Knee    Vasopneumatic Pressure  Low    Vasopneumatic Temperature   34               PT Short Term Goals - 12/01/17 1453      PT SHORT TERM GOAL #1   Title  STG's=LTG's.        PT Long Term Goals - 01/07/18 1026      PT LONG TERM GOAL #1   Title  Independent with a HEP.    Time  4    Period  Weeks    Status  On-going      PT LONG TERM GOAL #2   Title  Full right active knee extension in order  to normalize gait.    Time  4    Period  Weeks    Status  Achieved 0 deg R knee ext 01/07/2018      PT LONG TERM GOAL #3   Title  Active knee flexion to 115 degrees+ so the patient can perform functional tasks and do so with pain not > 2-3/10.    Time  4    Period  Weeks    Status  Achieved 120 deg R knee flexion 12/29/2017      PT LONG TERM GOAL #4   Title  Increase right knee strength to a solid 4+/5 to provide good stability for accomplishment of functional activities.    Time  4    Period  Weeks    Status  On-going      PT LONG TERM GOAL #5   Title  Perform ADL's with pain not > 2-3/10.    Time  4    Period  Weeks    Status  Achieved            Plan - 01/07/18 1035    Clinical Impression Statement  Patient tolerated today's treatment well although she still feels as if R knee isn't as normal as LLE. Patient also continues to report R knee weakness. Patient guided through more functional strengthening such as step ups without complaints. AROM of R knee extension measured as 0 deg following HS stretch and PROM into extension. Minimal R knee edema present surrounding R patella region. Normal modalites response noted following removal of the modalities.    Rehab Potential  Good    PT Frequency  2x / week    PT Duration  4 weeks    PT Treatment/Interventions  ADLs/Self Care Home Management;Cryotherapy;Electrical Stimulation;Ultrasound;Therapeutic activities;Therapeutic exercise;Patient/family education;Neuromuscular re-education;Manual techniques;Passive range of motion;Vasopneumatic Device    PT Next Visit Plan  PAIN-FREE right quadriceps strengthening and modalities PRN.    Consulted and Agree with Plan of Care  Patient       Patient will benefit  from skilled therapeutic intervention in order to improve the following deficits and impairments:  Decreased activity tolerance, Decreased range of motion, Decreased strength, Increased edema, Pain  Visit Diagnosis: Stiffness of  left knee, not elsewhere classified  Acute pain of left knee  Localized edema  Muscle weakness (generalized)     Problem List Patient Active Problem List   Diagnosis Date Noted  . Hematoma 11/27/2016  . Severe epistaxis 11/23/2016  . Vertigo 09/09/2016  . Renal vascular disease 10/31/2015  . Nodule of right lung 10/31/2015  . Asthma without status asthmaticus 10/31/2015  . Essential hypertension, malignant   . Hypothyroidism 10/15/2015  . Pancytopenia ([place]) 10/15/2015  . Hyponatremia 10/14/2015  . Thrombocytopenia ([place]) 06/16/2015  . Right renal artery stenosis ([place]) 05/09/2015  . Chronic diastolic heart failure ([place]) 04/03/2014  . Hypertensive urgency 04/02/2014  . Headache 04/02/2014  . CKD (chronic kidney disease) stage 3, GFR 30-59 ml/min (HCC) 04/02/2014  . Paroxysmal atrial fibrillation ([place]) 01/25/2014  . Hypokalemia 07/19/2013  . Anemia 07/19/2013  . Bilateral lower extremity edema 06/30/2013  . Hypertension with fluid overload 06/30/2013  . Pedal edema 05/10/2013  . Varicose veins of lower extremities with other complications 14/43/6016  . ABNORMAL STRESS ELECTROCARDIOGRAM 01/23/2010  . Hyperlipidemia 12/18/2009  . Essential hypertension 12/18/2009  . RENAL INSUFFICIENCY, CHRONIC 12/18/2009  . Arthropathy 12/18/2009    Standley Brooking, PTA 01/07/2018, 11:27 AM  [facility] [address] [place], [place], [address] Phone: [phone]   Fax:  [phone]  Name: ALIZA MORET MRN: [id number] Date of Birth: 04/09/1934

## 2018-01-12 ENCOUNTER — Encounter: Payer: Self-pay | Admitting: Family Medicine

## 2018-01-12 ENCOUNTER — Other Ambulatory Visit: Payer: PPO

## 2018-01-12 ENCOUNTER — Ambulatory Visit (INDEPENDENT_AMBULATORY_CARE_PROVIDER_SITE_OTHER): Payer: PPO | Admitting: Family Medicine

## 2018-01-12 ENCOUNTER — Ambulatory Visit: Payer: PPO | Admitting: *Deleted

## 2018-01-12 VITALS — BP 130/88 | HR 97 | Temp 97.1°F | Ht 66.0 in | Wt 123.0 lb

## 2018-01-12 DIAGNOSIS — E7800 Pure hypercholesterolemia, unspecified: Secondary | ICD-10-CM | POA: Diagnosis not present

## 2018-01-12 DIAGNOSIS — I10 Essential (primary) hypertension: Secondary | ICD-10-CM | POA: Diagnosis not present

## 2018-01-12 DIAGNOSIS — I480 Paroxysmal atrial fibrillation: Secondary | ICD-10-CM

## 2018-01-12 DIAGNOSIS — M25662 Stiffness of left knee, not elsewhere classified: Secondary | ICD-10-CM

## 2018-01-12 DIAGNOSIS — D696 Thrombocytopenia, unspecified: Secondary | ICD-10-CM | POA: Diagnosis not present

## 2018-01-12 DIAGNOSIS — M25562 Pain in left knee: Secondary | ICD-10-CM

## 2018-01-12 DIAGNOSIS — N183 Chronic kidney disease, stage 3 unspecified: Secondary | ICD-10-CM

## 2018-01-12 DIAGNOSIS — E559 Vitamin D deficiency, unspecified: Secondary | ICD-10-CM

## 2018-01-12 DIAGNOSIS — M6281 Muscle weakness (generalized): Secondary | ICD-10-CM

## 2018-01-12 DIAGNOSIS — S51812A Laceration without foreign body of left forearm, initial encounter: Secondary | ICD-10-CM | POA: Diagnosis not present

## 2018-01-12 DIAGNOSIS — I129 Hypertensive chronic kidney disease with stage 1 through stage 4 chronic kidney disease, or unspecified chronic kidney disease: Secondary | ICD-10-CM | POA: Diagnosis not present

## 2018-01-12 DIAGNOSIS — R600 Localized edema: Secondary | ICD-10-CM

## 2018-01-12 NOTE — Progress Notes (Signed)
Subjective: AS:TMHD tear PCP: Chipper Herb, MD QQI:WLNLG [name] is a 84 y.o. female presenting to clinic today for:  1. Skin tear Patient was getting labs done this morning in office when she knocked into a door and sustained a skin tear to the left dorsal aspect of her forearm.  Lesion is currently bleeding.  She is not on any anticoagulants but does take a baby aspirin 3 times per week.  She notes that she often sustains skin tears.  Last Td 08/03/2017.   ROS: Per HPI  Allergies  Allergen Reactions  . Amlodipine Other (See Comments)    edema  . Isosorbide Nitrate Nausea Only  . Other Nausea Only and Rash  . Sulfonamide Derivatives Nausea Only and Rash  . Terazosin Hives and Nausea Only   Past Medical History:  Diagnosis Date  . Anemia   . Arthritis   . Asthma   . CKD (chronic kidney disease), stage III ([place])   . Hyperlipidemia   . Hypertension   . Hypothyroidism   . Nodule of right lung    Right upper lobe  . Renal insufficiency    Chronic  . Renal vascular disease   . Right renal artery stenosis ([place]) 05/09/2015  . Vertigo     Current Outpatient Medications:  .  aspirin EC 81 MG tablet, Take 81 mg by mouth See admin instructions. Take 1 tablet (81 mg) by mouth every other night, Disp: , Rfl:  .  atenolol-chlorthalidone (TENORETIC) 50-25 MG tablet, TAKE 1 TABLET BY MOUTH DAILY., Disp: 90 tablet, Rfl: 0 .  atorvastatin (LIPITOR) 20 MG tablet, TAKE 1/2 TABLET DAILY, Disp: 90 tablet, Rfl: 1 .  calcium-vitamin D (OSCAL WITH D) 500-200 MG-UNIT per tablet, Take 1 tablet by mouth at bedtime. , Disp: , Rfl:  .  cholecalciferol (VITAMIN D) 1000 units tablet, Take 1,000 Units by mouth at bedtime. , Disp: , Rfl:  .  flecainide (TAMBOCOR) 50 MG tablet, TAKE 1 TABLET (50 MG TOTAL) BY MOUTH 2 (TWO) TIMES DAILY., Disp: 180 tablet, Rfl: 60 .  fluticasone (FLONASE) 50 MCG/ACT nasal spray, Place 1 spray into both nostrils 2 (two) times daily as needed for allergies or rhinitis. ,  Disp: , Rfl:  .  hydrALAZINE (APRESOLINE) 50 MG tablet, Take 100 mg by mouth 2 (two) times daily. , Disp: , Rfl:  .  hydroxypropyl methylcellulose / hypromellose (ISOPTO TEARS / GONIOVISC) 2.5 % ophthalmic solution, Place 1 drop into both eyes daily. , Disp: , Rfl:  .  levothyroxine (SYNTHROID, LEVOTHROID) 75 MCG tablet, TAKE 1 TABLET (75 MCG TOTAL) BY MOUTH DAILY., Disp: 90 tablet, Rfl: 0 .  lisinopril (PRINIVIL,ZESTRIL) 40 MG tablet, TAKE 1 TABLET BY MOUTH TWICE A DAY (Patient taking differently: TAKE 1 TABLET po daily), Disp: 60 tablet, Rfl: 3 .  Omega-3 Fatty Acids (FISH OIL) 1200 MG CAPS, Take 1,200 mg by mouth daily., Disp: , Rfl:  .  sodium chloride (OCEAN) 0.65 % SOLN nasal spray, Place 1 spray into both nostrils 2 (two) times daily. , Disp: , Rfl:  Social History   Socioeconomic History  . Marital status: Married    Spouse name: Not on file  . Number of children: 2  . Years of education: 68  . Highest education level: Not on file  Occupational History  . Occupation: Retired  Scientific laboratory technician  . Financial resource strain: Not on file  . Food insecurity:    Worry: Not on file    Inability: Not on  file  . Transportation needs:    Medical: Not on file    Non-medical: Not on file  Tobacco Use  . Smoking status: Never Smoker  . Smokeless tobacco: Never Used  . Tobacco comment: Pt denies cigarettes  Substance and Sexual Activity  . Alcohol use: No  . Drug use: No  . Sexual activity: Never  Lifestyle  . Physical activity:    Days per week: Not on file    Minutes per session: Not on file  . Stress: Not on file  Relationships  . Social connections:    Talks on phone: Not on file    Gets together: Not on file    Attends religious service: Not on file    Active member of club or organization: Not on file    Attends meetings of clubs or organizations: Not on file    Relationship status: Not on file  . Intimate partner violence:    Fear of current or ex partner: Not on file     Emotionally abused: Not on file    Physically abused: Not on file    Forced sexual activity: Not on file  Other Topics Concern  . Not on file  Social History Narrative   Lives at home with husband.   Ambidextrous (writes with left hand).   No caffeine use.   Family History  Problem Relation Age of Onset  . Thyroid disease Mother   . Stroke Father   . Hypertension Brother   . Lung cancer Maternal Uncle   . Cancer Maternal Grandmother        Liver  . Lung cancer Maternal Uncle   . Hypertension Daughter   . Hypercholesterolemia Daughter   . Hypercholesterolemia Daughter     Objective: Office vital signs reviewed. BP 130/88   Pulse 97   Temp (!) 97.1 F (36.2 C) (Oral)   Ht 5\' 6"  (1.676 m)   Wt 123 lb (55.8 kg)   BMI 19.85 kg/m   Physical Examination:  General: Awake, alert, well nourished, No acute distress Skin: 2 inch curvilinear laceration appreciated along the dorsum of the LUE.  It does expose the fatty layer of the subcu.  No tendons appreciated.  Is actively oozing.  Assessment/ Plan: 84 y.o. female   1. Skin tear of forearm without complication, left, initial encounter Tetanus up-to-date.  Bleeding was stopped with pressure and silver nitrate.  I offered sutures but patient preferred Steri-Strips.  These were applied x5.  A pressure bandage was also applied.  Home care instructions were reviewed with the patient.  Okay to remove pressure bandage tomorrow.  Leave Steri-Strips in place until they fall off for May remove after 5 days.  Signs and symptoms of infection were reviewed.  Reasons for return discussed.  Patient was given her single follow-up as needed.   [name], DO [place] [phone]

## 2018-01-12 NOTE — Therapy (Signed)
[facility] [place], [place], [address] Phone: [phone]   Fax:  [phone]  Physical Therapy Treatment  Patient Details  Name: [name] MRN: [id number] Date of Birth: November 15, 1933 Referring Provider: Esmond Plants MD.   Encounter Date: 01/12/2018  PT End of Session - 01/12/18 1049    Visit Number  13    Number of Visits  20    Date for PT Re-Evaluation  01/20/18    Authorization Type  FOTO AT LEAST EVERY 5TH VISIT.    PT Start Time  0945    PT Stop Time  1039    PT Time Calculation (min)  54 min    Activity Tolerance  Patient tolerated treatment well    Behavior During Therapy  WFL for tasks assessed/performed       Past Medical History:  Diagnosis Date  . Anemia   . Arthritis   . Asthma   . CKD (chronic kidney disease), stage III ([place])   . Hyperlipidemia   . Hypertension   . Hypothyroidism   . Nodule of right lung    Right upper lobe  . Renal insufficiency    Chronic  . Renal vascular disease   . Right renal artery stenosis ([place]) 05/09/2015  . Vertigo     Past Surgical History:  Procedure Laterality Date  . IR GENERIC HISTORICAL  08/29/2016   IR US GUIDE VASC ACCESS RIGHT 08/29/2016 [facility]  . IR GENERIC HISTORICAL  08/29/2016   IR RENAL BILAT S&I MOD SED 08/29/2016 [facility]  . THYROIDECTOMY, PARTIAL Right 1981   middle lobe removed 1st, then right lobe removed 7-8 years later (approx. 1988)    There were no vitals filed for this visit.  Subjective Assessment - 01/12/18 1011    Subjective  Reports her knee feeling "about the same."    Pertinent History  HTN    Limitations  Walking    How long can you walk comfortably?  Short distances around home.    Patient Stated Goals  Get back to normal.    Currently in Pain?  Yes    Pain Score  1     Pain Location  Knee    Pain Orientation  Right    Pain Descriptors / Indicators  Discomfort    Pain Onset  More than a month ago    Pain  Frequency  Constant                       [facility] Adult PT Treatment/Exercise - 01/12/18 0001      Knee/Hip Exercises: Aerobic   Nustep  L5 x15 min UE/LE activity      Knee/Hip Exercises: Standing   Lateral Step Up  Right;10 reps;Hand Hold: 2;Step Height: 4";3 sets    Forward Step Up  Right;10 reps;Hand Hold: 2;Step Height: 4";3 sets    Rocker Board  5 minutes      Knee/Hip Exercises: Seated   Long Arc Quad  Strengthening;Right;3 sets;10 reps    Long Arc Quad Weight  4 lbs.      Modalities   Modalities  Software engineer Action  IFC 1-10hz  x 15 mins    Electrical Stimulation Goals  Edema;Pain      Vasopneumatic   Number Minutes Vasopneumatic   15 minutes    Vasopnuematic Location  Knee    Vasopneumatic Pressure  Low    Vasopneumatic Temperature   34               PT Short Term Goals - 12/01/17 1453      PT SHORT TERM GOAL #1   Title  STG's=LTG's.        PT Long Term Goals - 01/07/18 1026      PT LONG TERM GOAL #1   Title  Independent with a HEP.    Time  4    Period  Weeks    Status  On-going      PT LONG TERM GOAL #2   Title  Full right active knee extension in order to normalize gait.    Time  4    Period  Weeks    Status  Achieved 0 deg R knee ext 01/07/2018      PT LONG TERM GOAL #3   Title  Active knee flexion to 115 degrees+ so the patient can perform functional tasks and do so with pain not > 2-3/10.    Time  4    Period  Weeks    Status  Achieved 120 deg R knee flexion 12/29/2017      PT LONG TERM GOAL #4   Title  Increase right knee strength to a solid 4+/5 to provide good stability for accomplishment of functional activities.    Time  4    Period  Weeks    Status  On-going      PT LONG TERM GOAL #5   Title  Perform ADL's with pain not > 2-3/10.    Time  4    Period  Weeks    Status  Achieved             Plan - 01/12/18 1051    Clinical Impression Statement  Pt arrived today with minimal complaints RT knee. Pt was able to complete all therex for strengthening and balance RT knee with mainly fatigue. No resistance increase today, but increased sets were tolerated well. Normal modality response.       Patient will benefit from skilled therapeutic intervention in order to improve the following deficits and impairments:     Visit Diagnosis: Stiffness of left knee, not elsewhere classified  Acute pain of left knee  Localized edema  Muscle weakness (generalized)     Problem List Patient Active Problem List   Diagnosis Date Noted  . Hematoma 11/27/2016  . Severe epistaxis 11/23/2016  . Vertigo 09/09/2016  . Renal vascular disease 10/31/2015  . Nodule of right lung 10/31/2015  . Asthma without status asthmaticus 10/31/2015  . Essential hypertension, malignant   . Hypothyroidism 10/15/2015  . Pancytopenia ([place]) 10/15/2015  . Hyponatremia 10/14/2015  . Thrombocytopenia ([place]) 06/16/2015  . Right renal artery stenosis ([place]) 05/09/2015  . Chronic diastolic heart failure ([place]) 04/03/2014  . Hypertensive urgency 04/02/2014  . Headache 04/02/2014  . CKD (chronic kidney disease) stage 3, GFR 30-59 ml/min (HCC) 04/02/2014  . Paroxysmal atrial fibrillation ([place]) 01/25/2014  . Hypokalemia 07/19/2013  . Anemia 07/19/2013  . Bilateral lower extremity edema 06/30/2013  . Hypertension with fluid overload 06/30/2013  . Pedal edema 05/10/2013  . Varicose veins of lower extremities with other complications 47/82/9562  . ABNORMAL STRESS ELECTROCARDIOGRAM 01/23/2010  . Hyperlipidemia 12/18/2009  . Essential hypertension 12/18/2009  . RENAL INSUFFICIENCY, CHRONIC 12/18/2009  . Arthropathy 12/18/2009    Julizza Sassone,CHRIS, PTA 01/12/2018, 10:55 AM  [facility] [address]  [place], [place], [address] Phone: [phone]   Fax:   [phone]  Name: [name] MRN: [id number] Date of Birth: 09/23/1933

## 2018-01-12 NOTE — Addendum Note (Signed)
Addended by: Zannie Cove on: 01/12/2018 01:20 PM   Modules accepted: Orders

## 2018-01-12 NOTE — Patient Instructions (Signed)
Skin Tear Care A skin tear is a wound in which the top layers of skin have peeled off the deeper skin or tissues underneath them. This is a common problem as people get older because the skin becomes thinner and more fragile. In addition, some medicines, such as oral corticosteroids, can lead to skin thinning if they are taken for long periods of time. A skin tear is often repaired with tape or skin adhesive strips. Depending on the location of the wound, a bandage (dressing) may be applied over the tape or skin adhesive strips. Follow these instructions at home: Wound care  Clean the wound as told by your health care provider. You may be instructed to keep the wound dry for the first few days. If you are told to clean the wound: ? Wash the wound with mild soap and water or a salt-water (saline) solution. ? Rinse the wound with water to remove all soap. ? Do not rub the wound dry. Let the wound air dry.  Change any dressings as told by your health care provider. This includes changing the dressing if it gets wet, gets dirty, or starts to smell bad.  Do not scratch or pick at the wound.  Protect the injured area until it has healed.  Check your wound every day for signs of infection. Check for: ? More redness, swelling, or pain. ? More fluid or blood. ? Warmth. ? Pus or a bad smell. Medicines   Take over-the-counter and prescription medicines only as told by your health care provider.  If you were prescribed an antibiotic medicine, take or apply it as told by your health care provider. Do not stop using the antibiotic even if your condition improves. General instructions  Keep the dressing dry as told by your health care provider.  Do not take baths, swim, or do anything that puts your wound underwater until your health care provider approves.  Keep all follow-up visits as told by your health care provider. This is important. Contact a health care provider if:  You have more  redness, swelling, or pain around your wound.  You have more fluid or blood coming from your wound.  Your wound feels warm to the touch.  You have pus or a bad smell coming from your wound. Get help right away if:  You have a red streak that goes away from the skin tear.  You have a fever and chills and your symptoms suddenly get worse. This information is not intended to replace advice given to you by your health care provider. Make sure you discuss any questions you have with your health care provider. Document Released: 04/29/2001 Document Revised: 03/30/2016 Document Reviewed: 06/25/2015 Elsevier Interactive Patient Education  2018 [employer]  

## 2018-01-13 ENCOUNTER — Telehealth: Payer: Self-pay | Admitting: Family Medicine

## 2018-01-13 DIAGNOSIS — D72818 Other decreased white blood cell count: Secondary | ICD-10-CM

## 2018-01-13 LAB — CBC WITH DIFFERENTIAL/PLATELET
Basophils Absolute: 0 10*3/uL (ref 0.0–0.2)
Basos: 0 %
EOS (ABSOLUTE): 0 10*3/uL (ref 0.0–0.4)
EOS: 1 %
HEMOGLOBIN: 10.8 g/dL — AB (ref 11.1–15.9)
Hematocrit: 33.9 % — ABNORMAL LOW (ref 34.0–46.6)
IMMATURE GRANULOCYTES: 0 %
Immature Grans (Abs): 0 10*3/uL (ref 0.0–0.1)
LYMPHS: 28 %
Lymphocytes Absolute: 0.7 10*3/uL (ref 0.7–3.1)
MCH: 28.4 pg (ref 26.6–33.0)
MCHC: 31.9 g/dL (ref 31.5–35.7)
MCV: 89 fL (ref 79–97)
MONOCYTES: 9 %
MONOS ABS: 0.2 10*3/uL (ref 0.1–0.9)
NEUTROS PCT: 62 %
Neutrophils Absolute: 1.5 10*3/uL (ref 1.4–7.0)
RBC: 3.8 x10E6/uL (ref 3.77–5.28)
RDW: 14.8 % (ref 12.3–15.4)
WBC: 2.4 10*3/uL — AB (ref 3.4–10.8)

## 2018-01-13 LAB — LIPID PANEL
CHOLESTEROL TOTAL: 129 mg/dL (ref 100–199)
Chol/HDL Ratio: 2.6 ratio (ref 0.0–4.4)
HDL: 49 mg/dL (ref >39)
LDL CALC: 61 mg/dL (ref 0–99)
TRIGLYCERIDES: 95 mg/dL (ref 0–149)
VLDL Cholesterol Cal: 19 mg/dL (ref 5–40)

## 2018-01-13 LAB — BMP8+EGFR
BUN/Creatinine Ratio: 15 (ref 12–28)
BUN: 19 mg/dL (ref 8–27)
CO2: 28 mmol/L (ref 20–29)
CREATININE: 1.23 mg/dL — AB (ref 0.57–1.00)
Calcium: 9.3 mg/dL (ref 8.7–10.3)
Chloride: 97 mmol/L (ref 96–106)
GFR calc Af Amer: 47 mL/min/{1.73_m2} — ABNORMAL LOW (ref >59)
GFR calc non Af Amer: 41 mL/min/{1.73_m2} — ABNORMAL LOW (ref >59)
GLUCOSE: 99 mg/dL (ref 65–99)
Potassium: 3.9 mmol/L (ref 3.5–5.2)
SODIUM: 142 mmol/L (ref 134–144)

## 2018-01-13 LAB — HEPATIC FUNCTION PANEL
ALBUMIN: 4.3 g/dL (ref 3.5–4.7)
ALK PHOS: 74 IU/L (ref 39–117)
ALT: 9 IU/L (ref 0–32)
AST: 17 IU/L (ref 0–40)
BILIRUBIN TOTAL: 0.5 mg/dL (ref 0.0–1.2)
BILIRUBIN, DIRECT: 0.16 mg/dL (ref 0.00–0.40)
Total Protein: 7.2 g/dL (ref 6.0–8.5)

## 2018-01-13 LAB — VITAMIN D 25 HYDROXY (VIT D DEFICIENCY, FRACTURES): Vit D, 25-Hydroxy: 58.6 ng/mL (ref 30.0–100.0)

## 2018-01-14 ENCOUNTER — Ambulatory Visit: Payer: PPO | Admitting: *Deleted

## 2018-01-14 DIAGNOSIS — R600 Localized edema: Secondary | ICD-10-CM

## 2018-01-14 DIAGNOSIS — M25562 Pain in left knee: Secondary | ICD-10-CM

## 2018-01-14 DIAGNOSIS — M25662 Stiffness of left knee, not elsewhere classified: Secondary | ICD-10-CM

## 2018-01-14 DIAGNOSIS — M6281 Muscle weakness (generalized): Secondary | ICD-10-CM

## 2018-01-14 NOTE — Telephone Encounter (Signed)
Pt aware of lab results and also needs another referral placed for hematology. Referral placed.

## 2018-01-14 NOTE — Therapy (Signed)
[name], [place], [address] Phone: [phone]   Fax:  [phone]  Physical Therapy Treatment  Patient Details  Name: [name] MRN: [id number] Date of Birth: Apr 07, 1934 Referring Provider: Esmond Plants MD.   Encounter Date: 01/14/2018  PT End of Session - 01/14/18 1024    Visit Number  14    Number of Visits  20    Date for PT Re-Evaluation  01/20/18    Authorization Type  FOTO AT LEAST EVERY 5TH VISIT.     34% limited 01-14-18    PT Start Time  0945    PT Stop Time  1044    PT Time Calculation (min)  59 min       Past Medical History:  Diagnosis Date  . Anemia   . Arthritis   . Asthma   . CKD (chronic kidney disease), stage III ([place])   . Hyperlipidemia   . Hypertension   . Hypothyroidism   . Nodule of right lung    Right upper lobe  . Renal insufficiency    Chronic  . Renal vascular disease   . Right renal artery stenosis ([place]) 05/09/2015  . Vertigo     Past Surgical History:  Procedure Laterality Date  . IR GENERIC HISTORICAL  08/29/2016   IR US GUIDE VASC ACCESS RIGHT 08/29/2016 [facility]  . IR GENERIC HISTORICAL  08/29/2016   IR RENAL BILAT S&I MOD SED 08/29/2016 [facility]  . THYROIDECTOMY, PARTIAL Right 1981   middle lobe removed 1st, then right lobe removed 7-8 years later (approx. 1988)    There were no vitals filed for this visit.  Subjective Assessment - 01/14/18 1007    Subjective  Reports her knee feeling "about the same."  My knee still pops and cracks at times, but doesn't hurt    Pertinent History  HTN    Limitations  Walking    How long can you walk comfortably?  Short distances around home.    Patient Stated Goals  Get back to normal.    Currently in Pain?  Yes    Pain Score  1     Pain Location  Knee    Pain Orientation  Right    Pain Descriptors / Indicators  Discomfort    Pain Type  Surgical pain    Pain Onset  More than a month ago    Pain Frequency   Constant                       [facility] Adult PT Treatment/Exercise - 01/14/18 0001      Knee/Hip Exercises: Aerobic   Nustep  L5 x  20  min UE/LE activity      Knee/Hip Exercises: Standing   Lateral Step Up  Right;10 reps;Hand Hold: 2;Step Height: 4";3 sets    Forward Step Up  Right;10 reps;Hand Hold: 2;Step Height: 4";3 sets    Rocker Board  5 minutes      Knee/Hip Exercises: Seated   Long Arc Quad  Strengthening;Right;3 sets;10 reps    Long Arc Quad Weight  4 lbs.      Modalities   Modalities  Psychologist, educational Location  R knee     Electrical Stimulation Action  IFC x 15 mins 80-'150hz'     Electrical Stimulation Goals  Edema;Pain      Vasopneumatic   Number Minutes Vasopneumatic  15 minutes    Vasopnuematic Location   Knee    Vasopneumatic Pressure  Low    Vasopneumatic Temperature   34      Manual Therapy   Manual Therapy  Passive ROM    Passive ROM  gentle manual PROM for right knee ext with low load holds flexion to 120 degrees               PT Short Term Goals - 12/01/17 1453      PT SHORT TERM GOAL #1   Title  STG's=LTG's.        PT Long Term Goals - 01/14/18 1020      PT LONG TERM GOAL #1   Title  Independent with a HEP.    Time  4    Period  Weeks    Status  On-going      PT LONG TERM GOAL #2   Title  Full right active knee extension in order to normalize gait.    Baseline  -4 degrees    Time  4    Period  Weeks    Status  Not Met      PT LONG TERM GOAL #3   Title  Active knee flexion to 115 degrees+ so the patient can perform functional tasks and do so with pain not > 2-3/10.    Time  4    Period  Weeks    Status  Achieved      PT LONG TERM GOAL #4   Title  Increase right knee strength to a solid 4+/5 to provide good stability for accomplishment of functional activities.    Time  4    Period  Weeks    Status  Achieved      PT LONG TERM GOAL #5    Title  Perform ADL's with pain not > 2-3/10.    Time  4    Period  Weeks    Status  Achieved            Plan - 01/14/18 1042    Clinical Impression Statement  Pt arrived today in good spirits and feels that she has really progressed in the last 2 weeks. She has met all LTGs except full knee extension. She was 4-120 degrees today. She will F/U with MD on 01-19-18 with possible DC to HEP/ gym program.    Clinical Presentation  Stable    Rehab Potential  Good    PT Frequency  2x / week    PT Duration  4 weeks    PT Treatment/Interventions  ADLs/Self Care Home Management;Cryotherapy;Electrical Stimulation;Ultrasound;Therapeutic activities;Therapeutic exercise;Patient/family education;Neuromuscular re-education;Manual techniques;Passive range of motion;Vasopneumatic Device    PT Next Visit Plan  DC to Gym program if MD approves    Consulted and Agree with Plan of Care  Patient       Patient will benefit from skilled therapeutic intervention in order to improve the following deficits and impairments:  Decreased activity tolerance, Decreased range of motion, Decreased strength, Increased edema, Pain  Visit Diagnosis: Stiffness of left knee, not elsewhere classified  Acute pain of left knee  Localized edema  Muscle weakness (generalized)     Problem List Patient Active Problem List   Diagnosis Date Noted  . Hematoma 11/27/2016  . Severe epistaxis 11/23/2016  . Vertigo 09/09/2016  . Renal vascular disease 10/31/2015  . Nodule of right lung 10/31/2015  . Asthma without status asthmaticus 10/31/2015  . Essential hypertension, malignant   . Hypothyroidism 10/15/2015  .  Pancytopenia ([place]) 10/15/2015  . Hyponatremia 10/14/2015  . Thrombocytopenia ([place]) 06/16/2015  . Right renal artery stenosis ([place]) 05/09/2015  . Chronic diastolic heart failure ([place]) 04/03/2014  . Hypertensive urgency 04/02/2014  . Headache 04/02/2014  . CKD (chronic kidney disease) stage 3, GFR 30-59  ml/min (HCC) 04/02/2014  . Paroxysmal atrial fibrillation ([place]) 01/25/2014  . Hypokalemia 07/19/2013  . Anemia 07/19/2013  . Bilateral lower extremity edema 06/30/2013  . Hypertension with fluid overload 06/30/2013  . Pedal edema 05/10/2013  . Varicose veins of lower extremities with other complications 90/07/2240  . ABNORMAL STRESS ELECTROCARDIOGRAM 01/23/2010  . Hyperlipidemia 12/18/2009  . Essential hypertension 12/18/2009  . RENAL INSUFFICIENCY, CHRONIC 12/18/2009  . Arthropathy 12/18/2009    RAMSEUR,CHRIS, PTA 01/14/2018, 11:04 AM  [facility] [place], [place], [address] Phone: [phone]   Fax:  [phone]  Name: TIFFNEY HAUGHTON MRN: [id number] Date of Birth: [birth year]

## 2018-01-18 ENCOUNTER — Ambulatory Visit (INDEPENDENT_AMBULATORY_CARE_PROVIDER_SITE_OTHER): Payer: PPO | Admitting: Family Medicine

## 2018-01-18 ENCOUNTER — Encounter: Payer: Self-pay | Admitting: Family Medicine

## 2018-01-18 ENCOUNTER — Ambulatory Visit (INDEPENDENT_AMBULATORY_CARE_PROVIDER_SITE_OTHER): Payer: PPO

## 2018-01-18 VITALS — BP 188/64 | HR 55 | Temp 97.7°F | Ht 66.0 in | Wt 123.0 lb

## 2018-01-18 DIAGNOSIS — D692 Other nonthrombocytopenic purpura: Secondary | ICD-10-CM

## 2018-01-18 DIAGNOSIS — E7800 Pure hypercholesterolemia, unspecified: Secondary | ICD-10-CM

## 2018-01-18 DIAGNOSIS — D61818 Other pancytopenia: Secondary | ICD-10-CM

## 2018-01-18 DIAGNOSIS — N183 Chronic kidney disease, stage 3 unspecified: Secondary | ICD-10-CM

## 2018-01-18 DIAGNOSIS — E559 Vitamin D deficiency, unspecified: Secondary | ICD-10-CM | POA: Diagnosis not present

## 2018-01-18 DIAGNOSIS — D696 Thrombocytopenia, unspecified: Secondary | ICD-10-CM

## 2018-01-18 DIAGNOSIS — I480 Paroxysmal atrial fibrillation: Secondary | ICD-10-CM

## 2018-01-18 DIAGNOSIS — I5032 Chronic diastolic (congestive) heart failure: Secondary | ICD-10-CM | POA: Diagnosis not present

## 2018-01-18 DIAGNOSIS — S51812A Laceration without foreign body of left forearm, initial encounter: Secondary | ICD-10-CM | POA: Diagnosis not present

## 2018-01-18 DIAGNOSIS — I10 Essential (primary) hypertension: Secondary | ICD-10-CM

## 2018-01-18 DIAGNOSIS — Z9889 Other specified postprocedural states: Secondary | ICD-10-CM

## 2018-01-18 DIAGNOSIS — H9191 Unspecified hearing loss, right ear: Secondary | ICD-10-CM | POA: Diagnosis not present

## 2018-01-18 LAB — SPECIMEN STATUS

## 2018-01-18 MED ORDER — LEVOTHYROXINE SODIUM 75 MCG PO TABS: ORAL_TABLET | ORAL | 3 refills | Status: DC

## 2018-01-18 NOTE — Patient Instructions (Addendum)
Medicare Annual Wellness Visit  [facility] and the medical providers at [facility] strive to bring you the best medical care.  In doing so we not only want to address your current medical conditions and concerns but also to detect new conditions early and prevent illness, disease and health-related problems.    Medicare offers a yearly Wellness Visit which allows our clinical staff to assess your need for preventative services including immunizations, lifestyle education, counseling to decrease risk of preventable diseases and screening for fall risk and other medical concerns.    This visit is provided free of charge (no copay) for all Medicare recipients. The clinical pharmacists at [facility] have begun to conduct these Wellness Visits which will also include a thorough review of all your medications.    As you primary medical provider recommend that you make an appointment for your Annual Wellness Visit if you have not done so already this year.  You may set up this appointment before you leave today or you may call back ([phone]) and schedule an appointment.  Please make sure when you call that you mention that you are scheduling your Annual Wellness Visit with the clinical pharmacist so that the appointment may be made for the proper length of time.     Continue current medications. Continue good therapeutic lifestyle changes which include good diet and exercise. Fall precautions discussed with patient. If an FOBT was given today- please return it to our front desk. If you are over 10 years old - you may need Prevnar 72 or the adult Pneumonia vaccine.  **Flu shots are available--- please call and schedule a FLU-CLINIC appointment**  After your visit with [place] today you will receive a survey in the mail or online from [employer] regarding your care with [place]. Please take a moment to fill this out. Your feedback is very  important to [place] as you can help [place] better understand your patient needs as well as improve your experience and satisfaction. WE CARE ABOUT YOU!!!   Continue to follow-up with orthopedist Continue to follow-up with a cardiologist Continue to follow-up with a nephrologist Take a copy of the blood work that was done and given to you today with you to those visits especially to the nephrologist and the orthopedist Discuss with the orthopedist about leaving off the aspirin completely We will check about why the platelet count was not done return to the office next Wednesday to recheck the laceration on the arm.

## 2018-01-18 NOTE — Progress Notes (Signed)
Subjective:    Patient ID: [name], female    DOB: February 17, 1934, 84 y.o.   MRN: [id number]  HPI Pt here for follow up and management of chronic medical problems which includes a fib, hyperlipidemia and hypertension. She is taking medication regularly.  The patient is here today to have her arm rechecked.  She is also due to get a chest x-ray return in FOBT and review her lab work with her.  She is requesting a refill on levothyroxine.  All cholesterol numbers with traditional lipid testing were excellent and at goal.  The CBC had a hemoglobin which was slightly decreased from previously at 10.8 with a normal being 11.1-15.9.  Her white count was more decreased this time than previously.  She has had problems with this in the past and she will be referred to hematologist for further evaluation of this.  The platelet count was canceled and we will make sure that this gets repeated if there was a laboratory error.  The blood sugar was good at 99.  The creatinine which is been elevated in the past was slightly lower and improved at 1.23.  All of the electrolytes including potassium are good.  Vitamin D level remains good and stable at 58.6.  All liver function tests were normal.  This patient has been followed for the orthopedist for her knee pain and did have an arthroscopy of this knee.  She is been getting physical therapy.  She is also followed regularly by the cardiologist and is seen by the nephrologist because of her chronic kidney disease.  She has seen the hematologist in the past because of her lympho-cytopenia.  The patient is doing well overall.  She says that in general her blood pressure readings at home run between 130 and 140s over the 60 range.  In this office it is always higher.  She denies any chest pain or shortness of breath anymore than usual.  She denies any nausea vomiting diarrhea blood in the stool.  Her stools are dark because she is taking 2 iron pills a day.  She just had knee  surgery and the orthopedic surgeon told her to stay on an aspirin a day for at least 30 days which she did and she is now back to 1 aspirin 3 times a week and we have encouraged her to discuss with him and hopefully we can stop aspirin altogether because of her drop in her hemoglobin.  She is passing her water without problems.  She does see Dr. Posey Pronto the nephrologist periodically and has an appointment coming up soon with him.     Patient Active Problem List   Diagnosis Date Noted  . Hematoma 11/27/2016  . Severe epistaxis 11/23/2016  . Vertigo 09/09/2016  . Renal vascular disease 10/31/2015  . Nodule of right lung 10/31/2015  . Asthma without status asthmaticus 10/31/2015  . Essential hypertension, malignant   . Hypothyroidism 10/15/2015  . Pancytopenia ([place]) 10/15/2015  . Hyponatremia 10/14/2015  . Thrombocytopenia ([place]) 06/16/2015  . Right renal artery stenosis ([place]) 05/09/2015  . Chronic diastolic heart failure ([place]) 04/03/2014  . Hypertensive urgency 04/02/2014  . Headache 04/02/2014  . CKD (chronic kidney disease) stage 3, GFR 30-59 ml/min (HCC) 04/02/2014  . Paroxysmal atrial fibrillation ([place]) 01/25/2014  . Hypokalemia 07/19/2013  . Anemia 07/19/2013  . Bilateral lower extremity edema 06/30/2013  . Hypertension with fluid overload 06/30/2013  . Pedal edema 05/10/2013  . Varicose veins of lower extremities with other complications  05/03/2013  . ABNORMAL STRESS ELECTROCARDIOGRAM 01/23/2010  . Hyperlipidemia 12/18/2009  . Essential hypertension 12/18/2009  . RENAL INSUFFICIENCY, CHRONIC 12/18/2009  . Arthropathy 12/18/2009   Outpatient Encounter Medications as of 01/18/2018  Medication Sig  . aspirin EC 81 MG tablet Take 81 mg by mouth See admin instructions. Take 1 tablet (81 mg) by mouth every other night  . atenolol-chlorthalidone (TENORETIC) 50-25 MG tablet TAKE 1 TABLET BY MOUTH DAILY.  Marland Kitchen atorvastatin (LIPITOR) 20 MG tablet TAKE 1/2 TABLET DAILY  . calcium-vitamin D  (OSCAL WITH D) 500-200 MG-UNIT per tablet Take 1 tablet by mouth at bedtime.   . cholecalciferol (VITAMIN D) 1000 units tablet Take 1,000 Units by mouth at bedtime.   . flecainide (TAMBOCOR) 50 MG tablet TAKE 1 TABLET (50 MG TOTAL) BY MOUTH 2 (TWO) TIMES DAILY.  . fluticasone (FLONASE) 50 MCG/ACT nasal spray Place 1 spray into both nostrils 2 (two) times daily as needed for allergies or rhinitis.   . hydrALAZINE (APRESOLINE) 50 MG tablet Take 100 mg by mouth 2 (two) times daily.   . hydroxypropyl methylcellulose / hypromellose (ISOPTO TEARS / GONIOVISC) 2.5 % ophthalmic solution Place 1 drop into both eyes daily.   Marland Kitchen levothyroxine (SYNTHROID, LEVOTHROID) 75 MCG tablet TAKE 1 TABLET (75 MCG TOTAL) BY MOUTH DAILY.  Marland Kitchen lisinopril (PRINIVIL,ZESTRIL) 40 MG tablet TAKE 1 TABLET BY MOUTH TWICE A DAY (Patient taking differently: TAKE 1 TABLET po daily)  . Omega-3 Fatty Acids (FISH OIL) 1200 MG CAPS Take 1,200 mg by mouth daily.  . sodium chloride (OCEAN) 0.65 % SOLN nasal spray Place 1 spray into both nostrils 2 (two) times daily.    No facility-administered encounter medications on file as of 01/18/2018.      Review of Systems  Constitutional: Negative.   HENT: Negative.   Eyes: Negative.   Respiratory: Negative.   Cardiovascular: Negative.   Gastrointestinal: Negative.   Endocrine: Negative.   Genitourinary: Negative.   Musculoskeletal: Negative.   Skin: Positive for wound (left forearm wound).  Allergic/Immunologic: Negative.   Neurological: Negative.   Hematological: Negative.   Psychiatric/Behavioral: Negative.        Objective:   Physical Exam  Constitutional: She is oriented to person, place, and time. She appears well-developed and well-nourished. No distress.  The patient is pleasant and alert and calm despite her elevated systolic blood pressure readings in the office  HENT:  Head: Normocephalic and atraumatic.  Right Ear: External ear normal.  Left Ear: External ear normal.    Nose: Nose normal.  Mouth/Throat: Oropharynx is clear and moist. No oropharyngeal exudate.  The patient complains of hearing loss in the right ear.  When she is finished with all of her doctors visits she will call us and we will make arrangements for her to see Dr. Vicie Mutters to further evaluate the hearing in the right ear.  Eyes: Pupils are equal, round, and reactive to light. Conjunctivae and EOM are normal. Right eye exhibits no discharge. Left eye exhibits no discharge.  Neck: Normal range of motion. Neck supple. No thyromegaly present.  Radiated bruit from the heart heard bilaterally  Cardiovascular: Normal rate, regular rhythm and intact distal pulses.  Murmur heard. Heart has a regular rate and rhythm at 60/min with a grade 2/6 systolic ejection murmur that radiates to both carotid  Pulmonary/Chest: Effort normal and breath sounds normal. She has no wheezes. She has no rales.  Clear anteriorly and posteriorly  Abdominal: Soft. Bowel sounds are normal. She exhibits no mass.  There is no tenderness.  No abdominal tenderness masses organ enlargement or bruits or inguinal adenopathy  Musculoskeletal: She exhibits tenderness and deformity. She exhibits no edema.  Still some limited range of motion of right knee and swelling compared to the left.  Patient sees orthopedic surgeon tomorrow.  Lymphadenopathy:    She has no cervical adenopathy.  Neurological: She is alert and oriented to person, place, and time. She has normal reflexes. No cranial nerve deficit.  Skin: Skin is warm and dry.  Patient skin is thin and she has multiple bruises with senile purpura on both arms.  Psychiatric: She has a normal mood and affect. Her behavior is normal. Judgment and thought content normal.  The patient has appropriate mood affect and behavior.  Nursing note and vitals reviewed.  BP (!) 188/64 (BP Location: Left Arm)   Pulse (!) 55   Temp 97.7 F (36.5 C) (Oral)   Ht 5\' 6"  (1.676 m)   Wt 123 lb  (55.8 kg)   BMI 19.85 kg/m         Assessment & Plan:  1. Paroxysmal atrial fibrillation (HCC) -The patient's heart rhythm was regular today at 60/min - DG Chest 2 View; Future  2. Pure hypercholesterolemia -Continue with current treatment and as aggressive therapeutic lifestyle changes as possible - DG Chest 2 View; Future  3. Skin tear of forearm without complication, left, initial encounter -The wound does appear to be healing well other than apparently there is a slight hematoma and there is slight drainage of blood from the wound.  A 2 x 2 dressing was placed on the wound with the [place] around this and she will keep this on there at least until there is no further bloody drainage and then she can go ahead and take a shower and get the area wet after that period of time.  We will recheck this again and about 9 days.  4. Vitamin D deficiency -This level was good and she will continue with current treatment  5. Thrombocytopenia ([place]) - Platelet Count on Citrated Bld  6. CKD (chronic kidney disease) stage 3, GFR 30-59 ml/min (HCC) -Follow-up with nephrology as planned  7. Essential hypertension -The blood pressure by me was 206/82.  Her readings at home have been running in the 1 30-1 40 range over 60. - DG Chest 2 View; Future  8. Senile purpura ([place]) -Patient has thin skin with lots of bruises over her body and especially on her extremities.  9. Status post arthroscopic knee surgery -Follow-up with orthopedic surgeon as planned tomorrow and discuss with him if it is okay to discontinue the baby aspirin altogether.  10. Pancytopenia ([place]) -Patient has or will have an appointment to follow-up with hematologist because of her low white count.  11. Chronic diastolic heart failure ([place]) -Follow-up with cardiology as planned  12. Decreased hearing of right ear -Refer to ear specialist to further evaluate hearing when patient lets [place] know if she is ready to go and we will  send her to Dr. Vicie Mutters.  Meds ordered this encounter  Medications  . levothyroxine (SYNTHROID, LEVOTHROID) 75 MCG tablet    Sig: TAKE 1 TABLET (75 MCG TOTAL) BY MOUTH DAILY.    Dispense:  90 tablet    Refill:  3   Patient Instructions                       Medicare Annual Wellness Visit  [facility] and the medical  providers at [facility] strive to bring you the best medical care.  In doing so we not only want to address your current medical conditions and concerns but also to detect new conditions early and prevent illness, disease and health-related problems.    Medicare offers a yearly Wellness Visit which allows our clinical staff to assess your need for preventative services including immunizations, lifestyle education, counseling to decrease risk of preventable diseases and screening for fall risk and other medical concerns.    This visit is provided free of charge (no copay) for all Medicare recipients. The clinical pharmacists at [facility] have begun to conduct these Wellness Visits which will also include a thorough review of all your medications.    As you primary medical provider recommend that you make an appointment for your Annual Wellness Visit if you have not done so already this year.  You may set up this appointment before you leave today or you may call back ([phone]) and schedule an appointment.  Please make sure when you call that you mention that you are scheduling your Annual Wellness Visit with the clinical pharmacist so that the appointment may be made for the proper length of time.     Continue current medications. Continue good therapeutic lifestyle changes which include good diet and exercise. Fall precautions discussed with patient. If an FOBT was given today- please return it to our front desk. If you are over 52 years old - you may need Prevnar 56 or the adult Pneumonia vaccine.  **Flu shots are  available--- please call and schedule a FLU-CLINIC appointment**  After your visit with [place] today you will receive a survey in the mail or online from [employer] regarding your care with [place]. Please take a moment to fill this out. Your feedback is very important to [place] as you can help [place] better understand your patient needs as well as improve your experience and satisfaction. WE CARE ABOUT YOU!!!   Continue to follow-up with orthopedist Continue to follow-up with a cardiologist Continue to follow-up with a nephrologist Take a copy of the blood work that was done and given to you today with you to those visits especially to the nephrologist and the orthopedist Discuss with the orthopedist about leaving off the aspirin completely We will check about why the platelet count was not done return to the office next Wednesday to recheck the laceration on the arm.   Arrie Senate MD

## 2018-01-19 ENCOUNTER — Encounter: Payer: PPO | Admitting: Physical Therapy

## 2018-01-19 LAB — PLATELET COUNT ON CITRATED BLD

## 2018-01-20 ENCOUNTER — Other Ambulatory Visit: Payer: Self-pay | Admitting: *Deleted

## 2018-01-20 ENCOUNTER — Ambulatory Visit: Payer: PPO | Attending: Orthopedic Surgery | Admitting: Physical Therapy

## 2018-01-20 ENCOUNTER — Other Ambulatory Visit (HOSPITAL_COMMUNITY)
Admission: RE | Admit: 2018-01-20 | Discharge: 2018-01-20 | Disposition: A | Discharge status: Home / Self Care | Payer: PPO | Source: Ambulatory Visit | Attending: Family Medicine | Admitting: Family Medicine

## 2018-01-20 ENCOUNTER — Encounter: Payer: Self-pay | Admitting: Physical Therapy

## 2018-01-20 DIAGNOSIS — R899 Unspecified abnormal finding in specimens from other organs, systems and tissues: Secondary | ICD-10-CM | POA: Insufficient documentation

## 2018-01-20 DIAGNOSIS — R600 Localized edema: Secondary | ICD-10-CM | POA: Insufficient documentation

## 2018-01-20 DIAGNOSIS — M6281 Muscle weakness (generalized): Secondary | ICD-10-CM

## 2018-01-20 DIAGNOSIS — M25662 Stiffness of left knee, not elsewhere classified: Secondary | ICD-10-CM | POA: Insufficient documentation

## 2018-01-20 DIAGNOSIS — R791 Abnormal coagulation profile: Secondary | ICD-10-CM | POA: Insufficient documentation

## 2018-01-20 DIAGNOSIS — M25562 Pain in left knee: Secondary | ICD-10-CM | POA: Insufficient documentation

## 2018-01-20 LAB — PLATELET COUNT: Platelets: 116 10*3/uL — ABNORMAL LOW (ref 150–400)

## 2018-01-20 NOTE — Therapy (Signed)
[facility] [place], [place], [address] Phone: [phone]   Fax:  [phone]  Physical Therapy Treatment  Patient Details  Name: [name] MRN: [id number] Date of Birth: 1933-11-08 Referring Provider: Esmond Plants MD.   Encounter Date: 01/20/2018  PT End of Session - 01/20/18 0955    Visit Number  15    Number of Visits  20    Date for PT Re-Evaluation  01/20/18    Authorization Type  FOTO AT LEAST EVERY 5TH VISIT.     34% limited 01-14-18    PT Start Time  0945    PT Stop Time  1032    PT Time Calculation (min)  47 min    Activity Tolerance  Patient tolerated treatment well    Behavior During Therapy  WFL for tasks assessed/performed       Past Medical History:  Diagnosis Date  . Anemia   . Arthritis   . Asthma   . CKD (chronic kidney disease), stage III ([place])   . Hyperlipidemia   . Hypertension   . Hypothyroidism   . Nodule of right lung    Right upper lobe  . Renal insufficiency    Chronic  . Renal vascular disease   . Right renal artery stenosis ([place]) 05/09/2015  . Vertigo     Past Surgical History:  Procedure Laterality Date  . IR GENERIC HISTORICAL  08/29/2016   IR US GUIDE VASC ACCESS RIGHT 08/29/2016 [facility]  . IR GENERIC HISTORICAL  08/29/2016   IR RENAL BILAT S&I MOD SED 08/29/2016 [facility]  . THYROIDECTOMY, PARTIAL Right 1981   middle lobe removed 1st, then right lobe removed 7-8 years later (approx. 1988)    There were no vitals filed for this visit.  Subjective Assessment - 01/20/18 0954    Subjective  Reports her knee is feeling "okay,"    Pertinent History  HTN    Limitations  Walking    How long can you walk comfortably?  Short distances around home.    Patient Stated Goals  Get back to normal.    Currently in Pain?  No/denies         [facility] PT Assessment - 01/20/18 0001      Assessment   Medical Diagnosis  Left knee arthroscopic surgery.    Onset Date/Surgical  Date  11/18/17    Next MD Visit  02/2018 if needed      Precautions   Precaution Comments  PAIN-FREE LEFT QUADRICEPS STRENGTHENING.      Restrictions   Weight Bearing Restrictions  No      ROM / Strength   AROM / PROM / Strength  AROM      AROM   Overall AROM   Deficits    AROM Assessment Site  Knee    Right/Left Knee  Right    Right Knee Extension  -1                   [facility] Adult PT Treatment/Exercise - 01/20/18 0001      Knee/Hip Exercises: Aerobic   Nustep  L6 x15 min      Knee/Hip Exercises: Standing   Terminal Knee Extension  Strengthening;Right;20 reps;Theraband    Theraband Level (Terminal Knee Extension)  Level 2 (Red)    Hip Abduction  Stengthening;Right;20 reps;Knee straight;Limitations    Abduction Limitations  Red theraband    Lateral Step Up  Right;3 sets;10 reps;Hand Hold: 2;Step Height: 8"  Forward Step Up  Right;3 sets;10 reps;Hand Hold: 2;Step Height: 6"      Knee/Hip Exercises: Seated   Long Arc Quad  Strengthening;Right;2 sets;10 reps;Weights    Long Arc Quad Weight  4 lbs.      Modalities   Modalities  Vasopneumatic      Vasopneumatic   Number Minutes Vasopneumatic   15 minutes    Vasopnuematic Location   Knee    Vasopneumatic Pressure  Low    Vasopneumatic Temperature   34               PT Short Term Goals - 12/01/17 1453      PT SHORT TERM GOAL #1   Title  STG's=LTG's.        PT Long Term Goals - 01/14/18 1020      PT LONG TERM GOAL #1   Title  Independent with a HEP.    Time  4    Period  Weeks    Status  On-going      PT LONG TERM GOAL #2   Title  Full right active knee extension in order to normalize gait.    Baseline  -4 degrees    Time  4    Period  Weeks    Status  Not Met      PT LONG TERM GOAL #3   Title  Active knee flexion to 115 degrees+ so the patient can perform functional tasks and do so with pain not > 2-3/10.    Time  4    Period  Weeks    Status  Achieved      PT LONG TERM GOAL #4    Title  Increase right knee strength to a solid 4+/5 to provide good stability for accomplishment of functional activities.    Time  4    Period  Weeks    Status  Achieved      PT LONG TERM GOAL #5   Title  Perform ADL's with pain not > 2-3/10.    Time  4    Period  Weeks    Status  Achieved            Plan - 01/20/18 1028    Clinical Impression Statement  Patient able to tolerate treatment well as she arrived only minimal complaints of discomfort surrounding the R patella. Minimally increased edema still present especially surrounding the R patella. Patient also reported with peri-patella pain when flexing R knee to descend during lateral step ups. Increased tactile, verbal and demo cueing required for proper technique during TKE. Normal vasopnuematic response noted folllowing removal of the modality. Patient wishes to continue PT to finish visits for strengthening.    Rehab Potential  Good    PT Frequency  2x / week    PT Duration  4 weeks    PT Treatment/Interventions  ADLs/Self Care Home Management;Cryotherapy;Electrical Stimulation;Ultrasound;Therapeutic activities;Therapeutic exercise;Patient/family education;Neuromuscular re-education;Manual techniques;Passive range of motion;Vasopneumatic Device    PT Next Visit Plan  Continue for 2 weeks per patient request.    Consulted and Agree with Plan of Care  Patient       Patient will benefit from skilled therapeutic intervention in order to improve the following deficits and impairments:  Decreased activity tolerance, Decreased range of motion, Decreased strength, Increased edema, Pain  Visit Diagnosis: Stiffness of left knee, not elsewhere classified  Acute pain of left knee  Localized edema  Muscle weakness (generalized)     Problem List Patient Active Problem  List   Diagnosis Date Noted  . Hematoma 11/27/2016  . Severe epistaxis 11/23/2016  . Vertigo 09/09/2016  . Renal vascular disease 10/31/2015  . Nodule of  right lung 10/31/2015  . Asthma without status asthmaticus 10/31/2015  . Essential hypertension, malignant   . Hypothyroidism 10/15/2015  . Pancytopenia ([place]) 10/15/2015  . Hyponatremia 10/14/2015  . Thrombocytopenia ([place]) 06/16/2015  . Right renal artery stenosis ([place]) 05/09/2015  . Chronic diastolic heart failure ([place]) 04/03/2014  . Hypertensive urgency 04/02/2014  . Headache 04/02/2014  . CKD (chronic kidney disease) stage 3, GFR 30-59 ml/min ([facility]) 04/02/2014  . Paroxysmal atrial fibrillation ([place]) 01/25/2014  . Hypokalemia 07/19/2013  . Anemia 07/19/2013  . Bilateral lower extremity edema 06/30/2013  . Hypertension with fluid overload 06/30/2013  . Pedal edema 05/10/2013  . Varicose veins of lower extremities with other complications 93/23/5573  . ABNORMAL STRESS ELECTROCARDIOGRAM 01/23/2010  . Hyperlipidemia 12/18/2009  . Essential hypertension 12/18/2009  . RENAL INSUFFICIENCY, CHRONIC 12/18/2009  . Arthropathy 12/18/2009    Standley Brooking, PTA 01/20/18 10:51 AM   [facility] [address] [place], [place], [address] Phone: [phone]   Fax:  [phone]  Name: LARRIE LUCIA MRN: [id number] Date of Birth: November 16, 1933

## 2018-01-21 ENCOUNTER — Encounter: Payer: PPO | Admitting: *Deleted

## 2018-01-24 NOTE — Addendum Note (Signed)
Addended by: Draedyn Weidinger, [place] W on: 01/24/2018 02:39 PM   Modules accepted: Orders

## 2018-01-26 ENCOUNTER — Ambulatory Visit: Payer: PPO | Admitting: Physical Therapy

## 2018-01-26 DIAGNOSIS — M6281 Muscle weakness (generalized): Secondary | ICD-10-CM

## 2018-01-26 DIAGNOSIS — M25662 Stiffness of left knee, not elsewhere classified: Secondary | ICD-10-CM

## 2018-01-26 DIAGNOSIS — M25562 Pain in left knee: Secondary | ICD-10-CM

## 2018-01-26 DIAGNOSIS — R600 Localized edema: Secondary | ICD-10-CM

## 2018-01-26 NOTE — Therapy (Signed)
[facility] [place], [place], [address] Phone: [phone]   Fax:  [phone]  Physical Therapy Treatment  Patient Details  Name: [name] MRN: [id number] Date of Birth: 1934/07/23 Referring Provider: Esmond Plants MD.   Encounter Date: 01/26/2018  PT End of Session - 01/26/18 0944    Visit Number  16    Number of Visits  20    Date for PT Re-Evaluation  02/10/18    Authorization Type  FOTO AT LEAST EVERY 5TH VISIT.     34% limited 01-14-18    PT Start Time  0944    PT Stop Time  1036    PT Time Calculation (min)  52 min    Activity Tolerance  Patient tolerated treatment well    Behavior During Therapy  WFL for tasks assessed/performed       Past Medical History:  Diagnosis Date  . Anemia   . Arthritis   . Asthma   . CKD (chronic kidney disease), stage III ([place])   . Hyperlipidemia   . Hypertension   . Hypothyroidism   . Nodule of right lung    Right upper lobe  . Renal insufficiency    Chronic  . Renal vascular disease   . Right renal artery stenosis ([place]) 05/09/2015  . Vertigo     Past Surgical History:  Procedure Laterality Date  . IR GENERIC HISTORICAL  08/29/2016   IR US GUIDE VASC ACCESS RIGHT 08/29/2016 [facility]  . IR GENERIC HISTORICAL  08/29/2016   IR RENAL BILAT S&I MOD SED 08/29/2016 [facility]  . THYROIDECTOMY, PARTIAL Right 1981   middle lobe removed 1st, then right lobe removed 7-8 years later (approx. 1988)    There were no vitals filed for this visit.  Subjective Assessment - 01/26/18 0945    Subjective  Patient reports there is no pain but her knee does not feel like it's back to normal.    Pertinent History  HTN    Limitations  Walking    How long can you walk comfortably?  Short distances around home.    Patient Stated Goals  Get back to normal.    Currently in Pain?  No/denies    Pain Orientation  Right    Pain Descriptors / Indicators  Discomfort    Pain Onset  More  than a month ago    Pain Frequency  Constant         [facility] PT Assessment - 01/26/18 0001      Assessment   Medical Diagnosis  Left knee arthroscopic surgery.    Onset Date/Surgical Date  11/18/17    Next MD Visit  02/2018 if needed      Precautions   Precaution Comments  PAIN-FREE LEFT QUADRICEPS STRENGTHENING.      Restrictions   Weight Bearing Restrictions  No                   [facility] Adult PT Treatment/Exercise - 01/26/18 0001      Knee/Hip Exercises: Aerobic   Nustep  L5 x15 min seat 6      Knee/Hip Exercises: Standing   Terminal Knee Extension  Strengthening;Right;20 reps;Theraband;10 reps 3x10    Theraband Level (Terminal Knee Extension)  Level 2 (Red)    Forward Step Up  Right;3 sets;10 reps;Hand Hold: 2;Step Height: 4"    Rocker Board  5 minutes      Knee/Hip Exercises: Seated   Long Glass blower/designer  sets;10 reps;Weights 3" hold    Long Arc Quad Weight  4 lbs.      Modalities   Modalities  Vasopneumatic      Acupuncturist Location  R knee     Electrical Stimulation Action  IFC    Electrical Stimulation Parameters  80-150 hz x15    Electrical Stimulation Goals  Pain      Vasopneumatic   Number Minutes Vasopneumatic   15 minutes    Vasopnuematic Location   Knee    Vasopneumatic Pressure  Low               PT Short Term Goals - 12/01/17 1453      PT SHORT TERM GOAL #1   Title  STG's=LTG's.        PT Long Term Goals - 01/14/18 1020      PT LONG TERM GOAL #1   Title  Independent with a HEP.    Time  4    Period  Weeks    Status  On-going      PT LONG TERM GOAL #2   Title  Full right active knee extension in order to normalize gait.    Baseline  -4 degrees    Time  4    Period  Weeks    Status  Not Met      PT LONG TERM GOAL #3   Title  Active knee flexion to 115 degrees+ so the patient can perform functional tasks and do so with pain not > 2-3/10.    Time  4    Period   Weeks    Status  Achieved      PT LONG TERM GOAL #4   Title  Increase right knee strength to a solid 4+/5 to provide good stability for accomplishment of functional activities.    Time  4    Period  Weeks    Status  Achieved      PT LONG TERM GOAL #5   Title  Perform ADL's with pain not > 2-3/10.    Time  4    Period  Weeks    Status  Achieved            Plan - 01/26/18 1239    Clinical Impression Statement  Patient was able to tolerate treatment well despite reports of increased pain in right patella with knee extension. Patient reported pain was minimal 2/10 and dissipated after rest. Patient required tactile cuing for proper form on rockerboard and during TKE. No adverse affects noted upon removal of modalities.     Clinical Presentation  Stable    Clinical Decision Making  Low    Rehab Potential  Good    PT Frequency  2x / week    PT Duration  4 weeks    PT Treatment/Interventions  ADLs/Self Care Home Management;Cryotherapy;Electrical Stimulation;Ultrasound;Therapeutic activities;Therapeutic exercise;Patient/family education;Neuromuscular re-education;Manual techniques;Passive range of motion;Vasopneumatic Device    PT Next Visit Plan  Continue POC with focus on strengthening, modalities PRN     Consulted and Agree with Plan of Care  Patient       Patient will benefit from skilled therapeutic intervention in order to improve the following deficits and impairments:  Decreased activity tolerance, Decreased range of motion, Decreased strength, Increased edema, Pain  Visit Diagnosis: Stiffness of left knee, not elsewhere classified  Acute pain of left knee  Localized edema  Muscle weakness (generalized)     Problem List Patient Active  Problem List   Diagnosis Date Noted  . Hematoma 11/27/2016  . Severe epistaxis 11/23/2016  . Vertigo 09/09/2016  . Renal vascular disease 10/31/2015  . Nodule of right lung 10/31/2015  . Asthma without status asthmaticus  10/31/2015  . Essential hypertension, malignant   . Hypothyroidism 10/15/2015  . Pancytopenia ([place]) 10/15/2015  . Hyponatremia 10/14/2015  . Thrombocytopenia ([place]) 06/16/2015  . Right renal artery stenosis ([place]) 05/09/2015  . Chronic diastolic heart failure ([place]) 04/03/2014  . Hypertensive urgency 04/02/2014  . Headache 04/02/2014  . CKD (chronic kidney disease) stage 3, GFR 30-59 ml/min ([facility]) 04/02/2014  . Paroxysmal atrial fibrillation ([place]) 01/25/2014  . Hypokalemia 07/19/2013  . Anemia 07/19/2013  . Bilateral lower extremity edema 06/30/2013  . Hypertension with fluid overload 06/30/2013  . Pedal edema 05/10/2013  . Varicose veins of lower extremities with other complications 83/02/3542  . ABNORMAL STRESS ELECTROCARDIOGRAM 01/23/2010  . Hyperlipidemia 12/18/2009  . Essential hypertension 12/18/2009  . RENAL INSUFFICIENCY, CHRONIC 12/18/2009  . Arthropathy 12/18/2009    Gabriela Eves, PT, DPT 01/26/2018, 12:42 PM  [facility] [address] [place], [place], [address] Phone: [phone]   Fax:  [phone]  Name: [name] MRN: [id number] Date of Birth: Jan 03, 1934

## 2018-01-27 DIAGNOSIS — H2513 Age-related nuclear cataract, bilateral: Secondary | ICD-10-CM | POA: Diagnosis not present

## 2018-01-27 DIAGNOSIS — H43393 Other vitreous opacities, bilateral: Secondary | ICD-10-CM | POA: Diagnosis not present

## 2018-01-28 ENCOUNTER — Encounter: Payer: PPO | Admitting: Physical Therapy

## 2018-01-29 ENCOUNTER — Inpatient Hospital Stay (HOSPITAL_COMMUNITY): Payer: PPO | Attending: Internal Medicine | Admitting: Internal Medicine

## 2018-01-29 ENCOUNTER — Inpatient Hospital Stay (HOSPITAL_COMMUNITY): Payer: PPO

## 2018-01-29 ENCOUNTER — Encounter (HOSPITAL_COMMUNITY): Payer: Self-pay | Admitting: Internal Medicine

## 2018-01-29 VITALS — BP 167/52 | HR 58 | Temp 98.1°F | Resp 14 | Ht 65.25 in | Wt 124.1 lb

## 2018-01-29 DIAGNOSIS — D72818 Other decreased white blood cell count: Secondary | ICD-10-CM

## 2018-01-29 DIAGNOSIS — R58 Hemorrhage, not elsewhere classified: Secondary | ICD-10-CM

## 2018-01-29 DIAGNOSIS — D72819 Decreased white blood cell count, unspecified: Secondary | ICD-10-CM

## 2018-01-29 DIAGNOSIS — D696 Thrombocytopenia, unspecified: Secondary | ICD-10-CM

## 2018-01-29 DIAGNOSIS — I10 Essential (primary) hypertension: Secondary | ICD-10-CM | POA: Diagnosis not present

## 2018-01-29 LAB — CBC WITH DIFFERENTIAL/PLATELET
Basophils Absolute: 0 10*3/uL (ref 0.0–0.1)
Basophils Relative: 0 %
Eosinophils Absolute: 0 10*3/uL (ref 0.0–0.7)
Eosinophils Relative: 1 %
HCT: 31.3 % — ABNORMAL LOW (ref 36.0–46.0)
HEMOGLOBIN: 10.2 g/dL — AB (ref 12.0–15.0)
LYMPHS PCT: 21 %
Lymphs Abs: 0.7 10*3/uL (ref 0.7–4.0)
MCH: 28.8 pg (ref 26.0–34.0)
MCHC: 32.6 g/dL (ref 30.0–36.0)
MCV: 88.4 fL (ref 78.0–100.0)
Monocytes Absolute: 0.4 10*3/uL (ref 0.1–1.0)
Monocytes Relative: 10 %
NEUTROS PCT: 68 %
Neutro Abs: 2.4 10*3/uL (ref 1.7–7.7)
Platelets: 133 10*3/uL — ABNORMAL LOW (ref 150–400)
RBC: 3.54 MIL/uL — AB (ref 3.87–5.11)
RDW: 14.7 % (ref 11.5–15.5)
WBC: 3.5 10*3/uL — AB (ref 4.0–10.5)

## 2018-01-29 LAB — PROTIME-INR
INR: 1.04
Prothrombin Time: 13.5 seconds (ref 11.4–15.2)

## 2018-01-29 LAB — COMPREHENSIVE METABOLIC PANEL
ALK PHOS: 63 U/L (ref 38–126)
ALT: 15 U/L (ref 14–54)
AST: 20 U/L (ref 15–41)
Albumin: 4 g/dL (ref 3.5–5.0)
Anion gap: 9 (ref 5–15)
BILIRUBIN TOTAL: 0.8 mg/dL (ref 0.3–1.2)
BUN: 24 mg/dL — ABNORMAL HIGH (ref 6–20)
CALCIUM: 9.7 mg/dL (ref 8.9–10.3)
CO2: 30 mmol/L (ref 22–32)
CREATININE: 1.26 mg/dL — AB (ref 0.44–1.00)
Chloride: 97 mmol/L — ABNORMAL LOW (ref 101–111)
GFR, EST AFRICAN AMERICAN: 45 mL/min — AB (ref >60)
GFR, EST NON AFRICAN AMERICAN: 39 mL/min — AB (ref >60)
Glucose, Bld: 105 mg/dL — ABNORMAL HIGH (ref 65–99)
Potassium: 4 mmol/L (ref 3.5–5.1)
Sodium: 136 mmol/L (ref 135–145)
Total Protein: 7.7 g/dL (ref 6.5–8.1)

## 2018-01-29 LAB — FERRITIN: FERRITIN: 224 ng/mL (ref 11–307)

## 2018-01-29 LAB — APTT: aPTT: 40 seconds — ABNORMAL HIGH (ref 24–36)

## 2018-01-29 LAB — SEDIMENTATION RATE: Sed Rate: 60 mm/hr — ABNORMAL HIGH (ref 0–22)

## 2018-01-29 LAB — C-REACTIVE PROTEIN: CRP: <0.8 mg/dL (ref <1.0)

## 2018-01-29 LAB — LACTATE DEHYDROGENASE: LDH: 154 U/L (ref 98–192)

## 2018-01-29 NOTE — Progress Notes (Signed)
izziness/vomiting/diarrhea  Diagnosis Bleeding - Plan: CBC with Differential/Platelet, Comprehensive metabolic panel, Lactate dehydrogenase, Ferritin, Protein electrophoresis, serum, APTT, Protime-INR, Von Willebrand panel, ANA, IFA (with reflex), Rheumatoid factor, C-reactive protein, Sedimentation rate  Thrombocytopenia (HCC) - Plan: CBC with Differential/Platelet, Comprehensive metabolic panel, Lactate dehydrogenase, Ferritin, Protein electrophoresis, serum, APTT, Protime-INR, Von Willebrand panel, ANA, IFA (with reflex), Rheumatoid factor, C-reactive protein, Sedimentation rate  Other decreased white blood cell (WBC) count - Plan: CBC with Differential/Platelet, Comprehensive metabolic panel, Lactate dehydrogenase, Ferritin, Protein electrophoresis, serum, APTT, Protime-INR, Von Willebrand panel, ANA, IFA (with reflex), Rheumatoid factor, C-reactive protein, Sedimentation rate  Staging Cancer Staging No matching staging information was found for the patient.  Assessment and Plan: 1.  Leukopenia.  I discussed with the patient that labs done 01/12/2018 showed WBC 2.4 HB 10.8.  Labs were repeated today and showed WBC 3.5 HB 10.2 and plts 133,000.  She has a normal differential.  WBC count is improved on labs done today.  She will RTC to go over results.  If counts trend downward, she will be given option of bone marrow biopsy for definitive diagnosis due to history of leukopenia.    2.  Thrombocytopenia.  Plt count on labs done today is adequate at 133,000.  No fragmentation noted.    3.  Easy bleeding and bruising.  She denies any family history of bleeding disorders or bleeding with procedures.  She is on Fish oil and is concerned this may be affecting her skin and bleeding.  She will hold the medication to determine if some of her symptoms improve.  Coagulation studies done today 01/29/2018 showed Normal PT 13.5 with PTT of 40.  ANA and VWF profile pending.  Pt has ASA on medication list.    4   HTN. BP is 167/62.  Follow-up with PCP.     Interval History: 84 yr old female who was last seen at [facility] in 2018 for Leukopenia and thrombocytopenia.  Pt did not keep her follow-up.    Current Status:  Pt is seen today for follow-up.  She is here to go over recent labs.  She is reporting bruising and easy bleeding.  She also reports she takes fish oil and is wondering if that may be causing some of her symptoms.     Problem List Patient Active Problem List   Diagnosis Date Noted  . Hematoma [T14.8XXA] 11/27/2016  . Severe epistaxis [R04.0] 11/23/2016  . Vertigo [R42] 09/09/2016  . Renal vascular disease [N28.89] 10/31/2015  . Nodule of right lung [R91.1] 10/31/2015  . Asthma without status asthmaticus [J45.909] 10/31/2015  . Essential hypertension, malignant [I10]   . Hypothyroidism [E03.9] 10/15/2015  . Pancytopenia ([place]) [I94.854] 10/15/2015  . Hyponatremia [E87.1] 10/14/2015  . Thrombocytopenia ([place]) [D69.6] 06/16/2015  . Right renal artery stenosis ([place]) [I70.1] 05/09/2015  . Chronic diastolic heart failure ([place]) [I50.32] 04/03/2014  . Hypertensive urgency [I16.0] 04/02/2014  . Headache [R51] 04/02/2014  . CKD (chronic kidney disease) stage 3, GFR 30-59 ml/min (HCC) [N18.3] 04/02/2014  . Paroxysmal atrial fibrillation ([place]) [I48.0] 01/25/2014  . Hypokalemia [E87.6] 07/19/2013  . Anemia [D64.9] 07/19/2013  . Bilateral lower extremity edema [R60.0] 06/30/2013  . Hypertension with fluid overload [I10, E87.79] 06/30/2013  . Pedal edema [R60.0] 05/10/2013  . Varicose veins of lower extremities with other complications [O27.035] 00/93/8182  . ABNORMAL STRESS ELECTROCARDIOGRAM [R94.31] 01/23/2010  . Hyperlipidemia [E78.5] 12/18/2009  . Essential hypertension [I10] 12/18/2009  . RENAL INSUFFICIENCY, CHRONIC [N18.9] 12/18/2009  . Arthropathy [M12.9] 12/18/2009  Past Medical History Past Medical History:  Diagnosis Date  . Anemia   . Arthritis   . Asthma   . CKD (chronic  kidney disease), stage III ([place])   . Hyperlipidemia   . Hypertension   . Hypothyroidism   . Nodule of right lung    Right upper lobe  . Renal insufficiency    Chronic  . Renal vascular disease   . Right renal artery stenosis ([place]) 05/09/2015  . Vertigo     Past Surgical History Past Surgical History:  Procedure Laterality Date  . IR GENERIC HISTORICAL  08/29/2016   IR US GUIDE VASC ACCESS RIGHT 08/29/2016 [facility]  . IR GENERIC HISTORICAL  08/29/2016   IR RENAL BILAT S&I MOD SED 08/29/2016 [facility]  . THYROIDECTOMY, PARTIAL Right 1981   middle lobe removed 1st, then right lobe removed 7-8 years later (approx. 51)    Family History Family History  Problem Relation Age of Onset  . Thyroid disease Mother   . Stroke Father   . Hypertension Brother   . Lung cancer Maternal Uncle   . Cancer Maternal Grandmother        Liver  . Lung cancer Maternal Uncle   . Hypertension Daughter   . Hypercholesterolemia Daughter   . Hypercholesterolemia Daughter      Social History  reports that she has never smoked. She has never used smokeless tobacco. She reports that she does not drink alcohol or use drugs.  Medications  Current Outpatient Medications:  .  aspirin EC 81 MG tablet, Take 81 mg by mouth See admin instructions. Take 1 tablet (81 mg) by mouth every other night, Disp: , Rfl:  .  atenolol-chlorthalidone (TENORETIC) 50-25 MG tablet, TAKE 1 TABLET BY MOUTH DAILY., Disp: 90 tablet, Rfl: 0 .  atorvastatin (LIPITOR) 20 MG tablet, TAKE 1/2 TABLET DAILY, Disp: 90 tablet, Rfl: 1 .  calcium-vitamin D (OSCAL WITH D) 500-200 MG-UNIT per tablet, Take 1 tablet by mouth at bedtime. , Disp: , Rfl:  .  cholecalciferol (VITAMIN D) 1000 units tablet, Take 1,000 Units by mouth at bedtime. , Disp: , Rfl:  .  flecainide (TAMBOCOR) 50 MG tablet, TAKE 1 TABLET (50 MG TOTAL) BY MOUTH 2 (TWO) TIMES DAILY., Disp: 180 tablet, Rfl: 60 .  fluticasone (FLONASE) 50 MCG/ACT nasal spray, Place 1  spray into both nostrils 2 (two) times daily as needed for allergies or rhinitis. , Disp: , Rfl:  .  hydrALAZINE (APRESOLINE) 50 MG tablet, Take 100 mg by mouth 2 (two) times daily. , Disp: , Rfl:  .  hydroxypropyl methylcellulose / hypromellose (ISOPTO TEARS / GONIOVISC) 2.5 % ophthalmic solution, Place 1 drop into both eyes daily. , Disp: , Rfl:  .  levothyroxine (SYNTHROID, LEVOTHROID) 75 MCG tablet, TAKE 1 TABLET (75 MCG TOTAL) BY MOUTH DAILY., Disp: 90 tablet, Rfl: 3 .  lisinopril (PRINIVIL,ZESTRIL) 40 MG tablet, TAKE 1 TABLET BY MOUTH TWICE A DAY (Patient taking differently: TAKE 1 TABLET po daily), Disp: 60 tablet, Rfl: 3 .  Omega-3 Fatty Acids (FISH OIL) 1200 MG CAPS, Take 1,200 mg by mouth daily., Disp: , Rfl:  .  sodium chloride (OCEAN) 0.65 % SOLN nasal spray, Place 1 spray into both nostrils 2 (two) times daily. , Disp: , Rfl:   Allergies Amlodipine; Isosorbide nitrate; Other; Sulfonamide derivatives; and Terazosin  Review of Systems Review of Systems - Oncology ROS as per HPI negative other than bruises and bleeding   Physical Exam  Vitals Wt Readings from  Last 3 Encounters:  01/29/18 124 lb 1.6 oz (56.3 kg)  01/18/18 123 lb (55.8 kg)  01/12/18 123 lb (55.8 kg)   Temp Readings from Last 3 Encounters:  01/29/18 98.1 F (36.7 C) (Oral)  01/18/18 97.7 F (36.5 C) (Oral)  01/12/18 (!) 97.1 F (36.2 C) (Oral)   BP Readings from Last 3 Encounters:  01/29/18 (!) 167/52  01/18/18 (!) 188/64  01/12/18 130/88   Pulse Readings from Last 3 Encounters:  01/29/18 (!) 58  01/18/18 (!) 55  01/12/18 97   Constitutional: Well-developed, well-nourished, and in no distress.   HENT: Head: Normocephalic and atraumatic.  Mouth/Throat: No oropharyngeal exudate. Mucosa moist. Eyes: Pupils are equal, round, and reactive to light. Conjunctivae are normal. No scleral icterus.  Neck: Normal range of motion. Neck supple. No JVD present.  Cardiovascular: Normal rate, regular rhythm and  normal heart sounds.  Exam reveals no gallop and no friction rub.   No murmur heard. Pulmonary/Chest: Effort normal and breath sounds normal. No respiratory distress. No wheezes.No rales.  Abdominal: Soft. Bowel sounds are normal. No distension. There is no tenderness. There is no guarding.  Musculoskeletal: No edema or tenderness.  Lymphadenopathy: No cervical, axillary or supraclavicular adenopathy.  Neurological: Alert and oriented to person, place, and time. No cranial nerve deficit.  Skin: Chronic skin changes with some thinning noted.  Minor bruises noted.   Psychiatric: Affect and judgment normal.   Labs Appointment on 01/29/2018  Component Date Value Ref Range Status  . WBC 01/29/2018 3.5* 4.0 - 10.5 K/uL Final  . RBC 01/29/2018 3.54* 3.87 - 5.11 MIL/uL Final  . Hemoglobin 01/29/2018 10.2* 12.0 - 15.0 g/dL Final  . HCT 01/29/2018 31.3* 36.0 - 46.0 % Final  . MCV 01/29/2018 88.4  78.0 - 100.0 fL Final  . MCH 01/29/2018 28.8  26.0 - 34.0 pg Final  . MCHC 01/29/2018 32.6  30.0 - 36.0 g/dL Final  . RDW 01/29/2018 14.7  11.5 - 15.5 % Final  . Platelets 01/29/2018 133* 150 - 400 K/uL Final  . Neutrophils Relative % 01/29/2018 68  % Final  . Neutro Abs 01/29/2018 2.4  1.7 - 7.7 K/uL Final  . Lymphocytes Relative 01/29/2018 21  % Final  . Lymphs Abs 01/29/2018 0.7  0.7 - 4.0 K/uL Final  . Monocytes Relative 01/29/2018 10  % Final  . Monocytes Absolute 01/29/2018 0.4  0.1 - 1.0 K/uL Final  . Eosinophils Relative 01/29/2018 1  % Final  . Eosinophils Absolute 01/29/2018 0.0  0.0 - 0.7 K/uL Final  . Basophils Relative 01/29/2018 0  % Final  . Basophils Absolute 01/29/2018 0.0  0.0 - 0.1 K/uL Final   Performed at [facility], [address], [place], [place] [address]  . Sodium 01/29/2018 136  135 - 145 mmol/L Final  . Potassium 01/29/2018 4.0  3.5 - 5.1 mmol/L Final  . Chloride 01/29/2018 97* 101 - 111 mmol/L Final  . CO2 01/29/2018 30  22 - 32 mmol/L Final  . Glucose, Bld 01/29/2018  105* 65 - 99 mg/dL Final  . BUN 01/29/2018 24* 6 - 20 mg/dL Final  . Creatinine, Ser 01/29/2018 1.26* 0.44 - 1.00 mg/dL Final  . Calcium 01/29/2018 9.7  8.9 - 10.3 mg/dL Final  . Total Protein 01/29/2018 7.7  6.5 - 8.1 g/dL Final  . Albumin 01/29/2018 4.0  3.5 - 5.0 g/dL Final  . AST 01/29/2018 20  15 - 41 U/L Final  . ALT 01/29/2018 15  14 - 54 U/L  Final  . Alkaline Phosphatase 01/29/2018 63  38 - 126 U/L Final  . Total Bilirubin 01/29/2018 0.8  0.3 - 1.2 mg/dL Final  . GFR calc non Af Amer 01/29/2018 39* >60 mL/min Final  . GFR calc Af Amer 01/29/2018 45* >60 mL/min Final   Comment: (NOTE) The eGFR has been calculated using the CKD EPI equation. This calculation has not been validated in all clinical situations. eGFR's persistently <60 mL/min signify possible Chronic Kidney Disease.   Georgiann Hahn gap 01/29/2018 9  5 - 15 Final   Performed at [facility], [address] [place], [place] [address]  . LDH 01/29/2018 154  98 - 192 U/L Final   Performed at [facility], [address] [place], [place] [address]  . aPTT 01/29/2018 40* 24 - 36 seconds Final   Comment:        IF BASELINE aPTT IS ELEVATED, SUGGEST PATIENT RISK ASSESSMENT BE USED TO DETERMINE APPROPRIATE ANTICOAGULANT THERAPY. Performed at [facility], [address], [place], [place] [address]   . Prothrombin Time 01/29/2018 13.5  11.4 - 15.2 seconds Final  . INR 01/29/2018 1.04   Final   Performed at [facility], [address], [place], [place] [address]  . Sed Rate 01/29/2018 60* 0 - 22 mm/hr Final   Performed at [facility], [address], [place], [place] [address]     Pathology Orders Placed This Encounter  Procedures  . CBC with Differential/Platelet    Standing Status:   Future    Number of Occurrences:   1    Standing Expiration Date:   01/30/2019  . Comprehensive metabolic panel    Standing Status:   Future    Number of Occurrences:   1    Standing Expiration Date:   01/30/2019  . Lactate dehydrogenase     Standing Status:   Future    Number of Occurrences:   1    Standing Expiration Date:   01/30/2019  . Ferritin    Standing Status:   Future    Number of Occurrences:   1    Standing Expiration Date:   01/30/2019  . Protein electrophoresis, serum    Standing Status:   Future    Number of Occurrences:   1    Standing Expiration Date:   01/30/2019  . APTT    Standing Status:   Future    Number of Occurrences:   1    Standing Expiration Date:   01/30/2019  . Protime-INR    Standing Status:   Future    Number of Occurrences:   1    Standing Expiration Date:   01/30/2019  . Von Willebrand panel    Standing Status:   Future    Number of Occurrences:   1    Standing Expiration Date:   01/30/2019  . ANA, IFA (with reflex)    Standing Status:   Future    Number of Occurrences:   1    Standing Expiration Date:   01/30/2019  . Rheumatoid factor    Standing Status:   Future    Number of Occurrences:   1    Standing Expiration Date:   01/30/2019  . C-reactive protein    Standing Status:   Future    Number of Occurrences:   1    Standing Expiration Date:   01/30/2019  . Sedimentation rate    Standing Status:   Future    Number of Occurrences:  1    Standing Expiration Date:   01/30/2019       Zoila Shutter MD

## 2018-01-29 NOTE — Patient Instructions (Signed)
[facility] Discharge Instructions  You saw Dr. Higgs today.   Thank you for choosing [facility] to provide your oncology and hematology care.  To afford each patient quality time with our provider, please arrive at least 15 minutes before your scheduled appointment time.   If you have a lab appointment with the Cancer Center please come in thru the  Main Entrance and check in at the main information desk  You need to re-schedule your appointment should you arrive 10 or more minutes late.  We strive to give you quality time with our providers, and arriving late affects you and other patients whose appointments are after yours.  Also, if you no show three or more times for appointments you may be dismissed from the clinic at the providers discretion.     Again, thank you for choosing [facility].  Our hope is that these requests will decrease the amount of time that you wait before being seen by our physicians.       _____________________________________________________________  Should you have questions after your visit to [facility], please contact our office at [phone] between the hours of 8:30 a.m. and 4:30 p.m.  Voicemails left after 4:30 p.m. will not be returned until the following business day.  For prescription refill requests, have your pharmacy contact our office.       Resources For Cancer Patients and their Caregivers ? American Cancer Society: Can assist with transportation, wigs, general needs, runs Look Good Feel Better.        1-888-227-6333 ? Cancer Care: Provides financial assistance, online support groups, medication/co-pay assistance.  1-800-813-HOPE (4673) ? [facility] Assists [place] cancer patients and their families through emotional , educational and financial support.  336-427-4357 ? [place] DSS Where to apply for food  stamps, Medicaid and utility assistance. 336-342-1394 ? [facility]: Transportation to medical appointments. 336-347-2287 ? Social Security Administration: May apply for disability if have a Stage IV cancer. 336-342-7796 1-800-772-1213 ? [place] Aging, Disability and Transit Services: Assists with nutrition, care and transit needs. 336-349-2343  Cancer Center Support Programs:   > Cancer Support Group  2nd Tuesday of the month 1pm-2pm, Journey Room   > Creative Journey  3rd Tuesday of the month 1130am-1pm, Journey Room     

## 2018-01-30 ENCOUNTER — Other Ambulatory Visit: Payer: Self-pay | Admitting: Family Medicine

## 2018-01-30 LAB — RHEUMATOID FACTOR

## 2018-02-01 LAB — PROTEIN ELECTROPHORESIS, SERUM
A/G RATIO SPE: 1.4 (ref 0.7–1.7)
ALPHA-2-GLOBULIN: 0.6 g/dL (ref 0.4–1.0)
Albumin ELP: 4.1 g/dL (ref 2.9–4.4)
Alpha-1-Globulin: 0.2 g/dL (ref 0.0–0.4)
BETA GLOBULIN: 0.9 g/dL (ref 0.7–1.3)
GAMMA GLOBULIN: 1.3 g/dL (ref 0.4–1.8)
Globulin, Total: 3 g/dL (ref 2.2–3.9)
Total Protein ELP: 7.1 g/dL (ref 6.0–8.5)

## 2018-02-01 LAB — VON WILLEBRAND PANEL
Coagulation Factor VIII: 136 % (ref 56–140)
Ristocetin Co-factor, Plasma: 133 % (ref 50–200)
VON WILLEBRAND ANTIGEN, PLASMA: 169 % (ref 50–200)

## 2018-02-01 LAB — ANTINUCLEAR ANTIBODIES, IFA: ANA Ab, IFA: NEGATIVE

## 2018-02-01 LAB — COAG STUDIES INTERP REPORT

## 2018-02-02 ENCOUNTER — Ambulatory Visit: Payer: PPO | Admitting: Physical Therapy

## 2018-02-02 ENCOUNTER — Encounter: Payer: Self-pay | Admitting: Physical Therapy

## 2018-02-02 DIAGNOSIS — M25662 Stiffness of left knee, not elsewhere classified: Secondary | ICD-10-CM | POA: Diagnosis not present

## 2018-02-02 DIAGNOSIS — M6281 Muscle weakness (generalized): Secondary | ICD-10-CM

## 2018-02-02 DIAGNOSIS — R600 Localized edema: Secondary | ICD-10-CM

## 2018-02-02 DIAGNOSIS — M25562 Pain in left knee: Secondary | ICD-10-CM

## 2018-02-02 NOTE — Therapy (Signed)
[facility] [place], [place], [address] Phone: [phone]   Fax:  [phone]  Physical Therapy Treatment  Patient Details  Name: [name] MRN: [id number] Date of Birth: 11/08/1933 Referring Provider: Esmond Plants MD.   Encounter Date: 02/02/2018  PT End of Session - 02/02/18 1007    Visit Number  17    Number of Visits  20    Date for PT Re-Evaluation  02/10/18    Authorization Type  FOTO AT LEAST EVERY 5TH VISIT.     34% limited 01-14-18    PT Start Time  [phone]    PT Stop Time  1038    PT Time Calculation (min)  50 min    Activity Tolerance  Patient tolerated treatment well    Behavior During Therapy  WFL for tasks assessed/performed       Past Medical History:  Diagnosis Date  . Anemia   . Arthritis   . Asthma   . CKD (chronic kidney disease), stage III ([place])   . Hyperlipidemia   . Hypertension   . Hypothyroidism   . Nodule of right lung    Right upper lobe  . Renal insufficiency    Chronic  . Renal vascular disease   . Right renal artery stenosis ([place]) 05/09/2015  . Vertigo     Past Surgical History:  Procedure Laterality Date  . IR GENERIC HISTORICAL  08/29/2016   IR US GUIDE VASC ACCESS RIGHT 08/29/2016 [facility]  . IR GENERIC HISTORICAL  08/29/2016   IR RENAL BILAT S&I MOD SED 08/29/2016 [facility]  . THYROIDECTOMY, PARTIAL Right 1981   middle lobe removed 1st, then right lobe removed 7-8 years later (approx. 1988)    There were no vitals filed for this visit.  Subjective Assessment - 02/02/18 0956    Subjective  Reports her leg feeling "okay"    Pertinent History  HTN    Limitations  Walking    How long can you walk comfortably?  Short distances around home.    Patient Stated Goals  Get back to normal.    Currently in Pain?  No/denies         [facility] PT Assessment - 02/02/18 0001      Assessment   Medical Diagnosis  Left knee arthroscopic surgery.    Onset Date/Surgical Date   11/18/17    Next MD Visit  02/2018 if needed      Precautions   Precaution Comments  PAIN-FREE LEFT QUADRICEPS STRENGTHENING.      Restrictions   Weight Bearing Restrictions  No                   [facility] Adult PT Treatment/Exercise - 02/02/18 0001      Knee/Hip Exercises: Aerobic   Nustep  L5 x17 min seat 6      Knee/Hip Exercises: Machines for Strengthening   Cybex Knee Extension  10# 3x10 reps    Cybex Knee Flexion  30# 3x10 reps      Knee/Hip Exercises: Standing   Hip Abduction  Stengthening;Both;20 reps;Knee straight;Limitations    Abduction Limitations  Red theraband    Functional Squat  2 sets;10 reps;Limitations    Functional Squat Limitations  red theraband       Modalities   Modalities  Electrical Stimulation;Vasopneumatic      Electrical Stimulation   Electrical Stimulation Location  R knee     Electrical Stimulation Action  IFC    Electrical Stimulation  Parameters  80-150 hz x15 min    Electrical Stimulation Goals  Other (comment) per patient request      Vasopneumatic   Number Minutes Vasopneumatic   15 minutes    Vasopnuematic Location   Knee    Vasopneumatic Pressure  Low    Vasopneumatic Temperature   34               PT Short Term Goals - 12/01/17 1453      PT SHORT TERM GOAL #1   Title  STG's=LTG's.        PT Long Term Goals - 01/14/18 1020      PT LONG TERM GOAL #1   Title  Independent with a HEP.    Time  4    Period  Weeks    Status  On-going      PT LONG TERM GOAL #2   Title  Full right active knee extension in order to normalize gait.    Baseline  -4 degrees    Time  4    Period  Weeks    Status  Not Met      PT LONG TERM GOAL #3   Title  Active knee flexion to 115 degrees+ so the patient can perform functional tasks and do so with pain not > 2-3/10.    Time  4    Period  Weeks    Status  Achieved      PT LONG TERM GOAL #4   Title  Increase right knee strength to a solid 4+/5 to provide good stability for  accomplishment of functional activities.    Time  4    Period  Weeks    Status  Achieved      PT LONG TERM GOAL #5   Title  Perform ADL's with pain not > 2-3/10.    Time  4    Period  Weeks    Status  Achieved            Plan - 02/02/18 1030    Clinical Impression Statement  Patient able to progress with strengthening exercises today without complaint of pain. Patient reports only intermittant pain that varies daily in R knee. No excessive edema noted surrounding R knee but patient requested modalities with normal response.    Rehab Potential  Good    PT Frequency  2x / week    PT Duration  4 weeks    PT Treatment/Interventions  ADLs/Self Care Home Management;Cryotherapy;Electrical Stimulation;Ultrasound;Therapeutic activities;Therapeutic exercise;Patient/family education;Neuromuscular re-education;Manual techniques;Passive range of motion;Vasopneumatic Device    PT Next Visit Plan  Continue POC with focus on strengthening, modalities PRN     Consulted and Agree with Plan of Care  Patient       Patient will benefit from skilled therapeutic intervention in order to improve the following deficits and impairments:  Decreased activity tolerance, Decreased range of motion, Decreased strength, Increased edema, Pain  Visit Diagnosis: Stiffness of left knee, not elsewhere classified  Acute pain of left knee  Localized edema  Muscle weakness (generalized)     Problem List Patient Active Problem List   Diagnosis Date Noted  . Hematoma 11/27/2016  . Severe epistaxis 11/23/2016  . Vertigo 09/09/2016  . Renal vascular disease 10/31/2015  . Nodule of right lung 10/31/2015  . Asthma without status asthmaticus 10/31/2015  . Essential hypertension, malignant   . Hypothyroidism 10/15/2015  . Pancytopenia ([place]) 10/15/2015  . Hyponatremia 10/14/2015  . Thrombocytopenia ([place]) 06/16/2015  . Right renal  artery stenosis ([place]) 05/09/2015  . Chronic diastolic heart failure ([place])  04/03/2014  . Hypertensive urgency 04/02/2014  . Headache 04/02/2014  . CKD (chronic kidney disease) stage 3, GFR 30-59 ml/min (HCC) 04/02/2014  . Paroxysmal atrial fibrillation ([place]) 01/25/2014  . Hypokalemia 07/19/2013  . Anemia 07/19/2013  . Bilateral lower extremity edema 06/30/2013  . Hypertension with fluid overload 06/30/2013  . Pedal edema 05/10/2013  . Varicose veins of lower extremities with other complications 92/33/0076  . ABNORMAL STRESS ELECTROCARDIOGRAM 01/23/2010  . Hyperlipidemia 12/18/2009  . Essential hypertension 12/18/2009  . RENAL INSUFFICIENCY, CHRONIC 12/18/2009  . Arthropathy 12/18/2009    Standley Brooking, PTA 02/02/2018, 10:45 AM  [facility] [address] [place], [place], [address] Phone: [phone]   Fax:  [phone]  Name: NYCOLE KAWAHARA MRN: [id number] Date of Birth: Jun 12, 1934

## 2018-02-10 ENCOUNTER — Encounter: Payer: Self-pay | Admitting: Physical Therapy

## 2018-02-10 ENCOUNTER — Ambulatory Visit: Payer: PPO | Admitting: Physical Therapy

## 2018-02-10 DIAGNOSIS — M25662 Stiffness of left knee, not elsewhere classified: Secondary | ICD-10-CM | POA: Diagnosis not present

## 2018-02-10 DIAGNOSIS — M25562 Pain in left knee: Secondary | ICD-10-CM

## 2018-02-10 DIAGNOSIS — M6281 Muscle weakness (generalized): Secondary | ICD-10-CM

## 2018-02-10 DIAGNOSIS — R600 Localized edema: Secondary | ICD-10-CM

## 2018-02-10 NOTE — Therapy (Signed)
[facility] [place], [place], [address] Phone: [phone]   Fax:  [phone]  Physical Therapy Treatment  Patient Details  Name: [name] MRN: [id number] Date of Birth: 03-04-1934 Referring Provider: Esmond Plants MD.   Encounter Date: 02/10/2018  PT End of Session - 02/10/18 0954    Visit Number  18    Number of Visits  20    Date for PT Re-Evaluation  02/10/18    Authorization Type  FOTO AT LEAST EVERY 5TH VISIT.     34% limited 01-14-18    PT Start Time  0947    PT Stop Time  1038    PT Time Calculation (min)  51 min    Activity Tolerance  Patient tolerated treatment well    Behavior During Therapy  WFL for tasks assessed/performed       Past Medical History:  Diagnosis Date  . Anemia   . Arthritis   . Asthma   . CKD (chronic kidney disease), stage III ([place])   . Hyperlipidemia   . Hypertension   . Hypothyroidism   . Nodule of right lung    Right upper lobe  . Renal insufficiency    Chronic  . Renal vascular disease   . Right renal artery stenosis ([place]) 05/09/2015  . Vertigo     Past Surgical History:  Procedure Laterality Date  . IR GENERIC HISTORICAL  08/29/2016   IR US GUIDE VASC ACCESS RIGHT 08/29/2016 [facility]  . IR GENERIC HISTORICAL  08/29/2016   IR RENAL BILAT S&I MOD SED 08/29/2016 [facility]  . THYROIDECTOMY, PARTIAL Right 1981   middle lobe removed 1st, then right lobe removed 7-8 years later (approx. 1988)    There were no vitals filed for this visit.  Subjective Assessment - 02/10/18 0953    Subjective  Reports that her R knee did okay at her granddaughter's wedding over the weekend. Continues to experience pain across the R patella region that has started in the last several weeks.    Pertinent History  HTN    Limitations  Walking    How long can you walk comfortably?  Short distances around home.    Patient Stated Goals  Get back to normal.    Currently in Pain?   No/denies         [facility] PT Assessment - 02/10/18 0001      Assessment   Medical Diagnosis  Left knee arthroscopic surgery.    Onset Date/Surgical Date  11/18/17    Next MD Visit  02/2018 if needed      Precautions   Precaution Comments  PAIN-FREE LEFT QUADRICEPS STRENGTHENING.      Restrictions   Weight Bearing Restrictions  No                   [facility] Adult PT Treatment/Exercise - 02/10/18 0001      Knee/Hip Exercises: Stretches   Passive Hamstring Stretch  Right;2 reps;30 seconds    ITB Stretch  Right;2 reps;30 seconds      Knee/Hip Exercises: Aerobic   Nustep  L5 x17 min seat 6      Knee/Hip Exercises: Machines for Strengthening   Cybex Knee Extension  10# 3x10 reps could feel mild discomfort across R patella    Cybex Knee Flexion  30# 3x10 reps could feel discomfort in posterior knee      Knee/Hip Exercises: Standing   Terminal Knee Extension  Strengthening;Right;20 reps;Theraband  Theraband Level (Terminal Knee Extension)  Level 2 (Red)      Knee/Hip Exercises: Supine   Straight Leg Raises  AROM;Right;20 reps    Straight Leg Raise with External Rotation  AROM;Right;10 reps      Modalities   Modalities  Theme park manager Action  Pre-Mod    Electrical Stimulation Parameters  80-150 hz x10 min    Electrical Stimulation Goals  Pain      Vasopneumatic   Number Minutes Vasopneumatic   10 minutes    Vasopnuematic Location   Knee    Vasopneumatic Pressure  Low    Vasopneumatic Temperature   34             PT Education - 02/10/18 1027    Education Details  HEP- SLR, SLR with ER, LAQ    Person(s) Educated  Patient    Methods  Explanation;Handout    Comprehension  Verbalized understanding       PT Short Term Goals - 12/01/17 1453      PT SHORT TERM GOAL #1   Title  STG's=LTG's.        PT Long Term Goals - 01/14/18 1020       PT LONG TERM GOAL #1   Title  Independent with a HEP.    Time  4    Period  Weeks    Status  On-going      PT LONG TERM GOAL #2   Title  Full right active knee extension in order to normalize gait.    Baseline  -4 degrees    Time  4    Period  Weeks    Status  Not Met      PT LONG TERM GOAL #3   Title  Active knee flexion to 115 degrees+ so the patient can perform functional tasks and do so with pain not > 2-3/10.    Time  4    Period  Weeks    Status  Achieved      PT LONG TERM GOAL #4   Title  Increase right knee strength to a solid 4+/5 to provide good stability for accomplishment of functional activities.    Time  4    Period  Weeks    Status  Achieved      PT LONG TERM GOAL #5   Title  Perform ADL's with pain not > 2-3/10.    Time  4    Period  Weeks    Status  Achieved            Plan - 02/10/18 1039    Clinical Impression Statement  Patient tolerated today's treatment well other than the continued reports of mild discomfort across R patella region. Patient indicted that the mild discomfort in elicited during mid LAQ. Patient encouraged to continue through painfree range during machine strengthening. Patient able to complete exercises slowly both concentric and eccentrically. Patient educated that healing following arthoscopy may take time for healing. Patient provided HEP for knee/quad strengthening and home and encouraged patient to try to complete the parameters as best she could. R ITB and HS stretches completed following reports of intermittant R lateral thigh pain and lack of full R knee extension. Normal modalities response noted following removal of the modalities.    Rehab Potential  Good    PT Frequency  2x / week    PT  Duration  4 weeks    PT Treatment/Interventions  ADLs/Self Care Home Management;Cryotherapy;Electrical Stimulation;Ultrasound;Therapeutic activities;Therapeutic exercise;Patient/family education;Neuromuscular re-education;Manual  techniques;Passive range of motion;Vasopneumatic Device    PT Next Visit Plan  Continue POC with focus on strengthening, modalities PRN     Consulted and Agree with Plan of Care  Patient       Patient will benefit from skilled therapeutic intervention in order to improve the following deficits and impairments:  Decreased activity tolerance, Decreased range of motion, Decreased strength, Increased edema, Pain  Visit Diagnosis: Stiffness of left knee, not elsewhere classified  Acute pain of left knee  Localized edema  Muscle weakness (generalized)     Problem List Patient Active Problem List   Diagnosis Date Noted  . Hematoma 11/27/2016  . Severe epistaxis 11/23/2016  . Vertigo 09/09/2016  . Renal vascular disease 10/31/2015  . Nodule of right lung 10/31/2015  . Asthma without status asthmaticus 10/31/2015  . Essential hypertension, malignant   . Hypothyroidism 10/15/2015  . Pancytopenia ([place]) 10/15/2015  . Hyponatremia 10/14/2015  . Thrombocytopenia ([place]) 06/16/2015  . Right renal artery stenosis ([place]) 05/09/2015  . Chronic diastolic heart failure ([place]) 04/03/2014  . Hypertensive urgency 04/02/2014  . Headache 04/02/2014  . CKD (chronic kidney disease) stage 3, GFR 30-59 ml/min ([facility]) 04/02/2014  . Paroxysmal atrial fibrillation ([place]) 01/25/2014  . Hypokalemia 07/19/2013  . Anemia 07/19/2013  . Bilateral lower extremity edema 06/30/2013  . Hypertension with fluid overload 06/30/2013  . Pedal edema 05/10/2013  . Varicose veins of lower extremities with other complications 97/35/3299  . ABNORMAL STRESS ELECTROCARDIOGRAM 01/23/2010  . Hyperlipidemia 12/18/2009  . Essential hypertension 12/18/2009  . RENAL INSUFFICIENCY, CHRONIC 12/18/2009  . Arthropathy 12/18/2009    Standley Brooking, PTA 02/10/2018, 10:44 AM  [facility] [address] [place], [place], [address] Phone: [phone]   Fax:  [phone]  Name: [name] MRN: [id number] Date of Birth: 07-03-1934

## 2018-02-12 DIAGNOSIS — N183 Chronic kidney disease, stage 3 (moderate): Secondary | ICD-10-CM | POA: Diagnosis not present

## 2018-02-12 DIAGNOSIS — D649 Anemia, unspecified: Secondary | ICD-10-CM | POA: Diagnosis not present

## 2018-02-12 DIAGNOSIS — Z1231 Encounter for screening mammogram for malignant neoplasm of breast: Secondary | ICD-10-CM | POA: Diagnosis not present

## 2018-02-12 DIAGNOSIS — I129 Hypertensive chronic kidney disease with stage 1 through stage 4 chronic kidney disease, or unspecified chronic kidney disease: Secondary | ICD-10-CM | POA: Diagnosis not present

## 2018-02-12 LAB — HM MAMMOGRAPHY

## 2018-02-15 ENCOUNTER — Encounter: Payer: Self-pay | Admitting: Physical Therapy

## 2018-02-15 ENCOUNTER — Ambulatory Visit: Payer: PPO | Attending: Orthopedic Surgery | Admitting: Physical Therapy

## 2018-02-15 DIAGNOSIS — R600 Localized edema: Secondary | ICD-10-CM

## 2018-02-15 DIAGNOSIS — M25662 Stiffness of left knee, not elsewhere classified: Secondary | ICD-10-CM

## 2018-02-15 DIAGNOSIS — M6281 Muscle weakness (generalized): Secondary | ICD-10-CM | POA: Diagnosis not present

## 2018-02-15 DIAGNOSIS — M25562 Pain in left knee: Secondary | ICD-10-CM | POA: Insufficient documentation

## 2018-02-15 NOTE — Therapy (Addendum)
[facility] [place], [place], [address] Phone: [phone]   Fax:  [phone]  Physical Therapy Treatment  Patient Details  Name: [name] MRN: [id number] Date of Birth: 1934-04-08 Referring Provider: Esmond Plants MD.   Encounter Date: 02/15/2018  PT End of Session - 02/15/18 0931    Visit Number  19    Number of Visits  20    Date for PT Re-Evaluation  02/10/18    Authorization Type  FOTO AT LEAST EVERY 5TH VISIT.     34% limited 01-14-18    PT Start Time  0905    PT Stop Time  0955    PT Time Calculation (min)  50 min    Activity Tolerance  Patient tolerated treatment well    Behavior During Therapy  [facility] for tasks assessed/performed       Past Medical History:  Diagnosis Date  . Anemia   . Arthritis   . Asthma   . CKD (chronic kidney disease), stage III ([place])   . Hyperlipidemia   . Hypertension   . Hypothyroidism   . Nodule of right lung    Right upper lobe  . Renal insufficiency    Chronic  . Renal vascular disease   . Right renal artery stenosis ([place]) 05/09/2015  . Vertigo     Past Surgical History:  Procedure Laterality Date  . IR GENERIC HISTORICAL  08/29/2016   IR US GUIDE VASC ACCESS RIGHT 08/29/2016 [facility]  . IR GENERIC HISTORICAL  08/29/2016   IR RENAL BILAT S&I MOD SED 08/29/2016 [facility]  . THYROIDECTOMY, PARTIAL Right 1981   middle lobe removed 1st, then right lobe removed 7-8 years later (approx. 1988)    There were no vitals filed for this visit.  Subjective Assessment - 02/15/18 0921    Subjective  Reports that she sees Dr. Veverly Fells tomorrow. Reports that she does not want to do knee machine due to the knee discomfort.    Pertinent History  HTN    Limitations  Walking    How long can you walk comfortably?  Short distances around home.    Patient Stated Goals  Get back to normal.    Currently in Pain?  Yes    Pain Score  1     Pain Location  Knee    Pain Orientation   Right    Pain Descriptors / Indicators  Discomfort    Pain Type  Surgical pain    Pain Onset  More than a month ago         [facility] PT Assessment - 02/15/18 0001      Assessment   Medical Diagnosis  Left knee arthroscopic surgery.    Onset Date/Surgical Date  11/18/17    Next MD Visit  02/2018 if needed      Precautions   Precaution Comments  PAIN-FREE LEFT QUADRICEPS STRENGTHENING.      Restrictions   Weight Bearing Restrictions  No      ROM / Strength   AROM / PROM / Strength  AROM      AROM   Overall AROM   Within functional limits for tasks performed    AROM Assessment Site  Knee    Right/Left Knee  Right    Right Knee Extension  0                   [facility] Adult PT Treatment/Exercise - 02/15/18 0001  Knee/Hip Exercises: Aerobic   Nustep  L5 x17 min seat 6      Knee/Hip Exercises: Standing   Terminal Knee Extension  Strengthening;Right;20 reps;Theraband    Theraband Level (Terminal Knee Extension)  Level 3 (Green)    Hip Abduction  AROM;Both;2 sets;10 reps;Knee straight    Forward Step Up  Right;2 sets;10 reps;Hand Hold: 2;Step Height: 6"      Knee/Hip Exercises: Seated   Long Arc Quad  Strengthening;Right;2 sets;10 reps;Weights    Long Arc Quad Weight  4 lbs.      Knee/Hip Exercises: Supine   Straight Leg Raises  AROM;Right;20 reps      Modalities   Modalities  Management consultant  IFC    Electrical Stimulation Parameters  80-150 hz x15 min    Electrical Stimulation Goals  Pain      Vasopneumatic   Number Minutes Vasopneumatic   15 minutes    Vasopnuematic Location   Knee    Vasopneumatic Pressure  Low    Vasopneumatic Temperature   34               PT Short Term Goals - 12/01/17 1453      PT SHORT TERM GOAL #1   Title  STG's=LTG's.        PT Long Term Goals - 02/15/18 0941      PT LONG TERM GOAL #1    Title  Independent with a HEP.    Time  4    Period  Weeks    Status  Partially Met      PT LONG TERM GOAL #2   Title  Full right active knee extension in order to normalize gait.    Baseline  -4 degrees    Time  4    Period  Weeks    Status  Achieved      PT LONG TERM GOAL #3   Title  Active knee flexion to 115 degrees+ so the patient can perform functional tasks and do so with pain not > 2-3/10.    Time  4    Period  Weeks    Status  Achieved      PT LONG TERM GOAL #4   Title  Increase right knee strength to a solid 4+/5 to provide good stability for accomplishment of functional activities.    Time  4    Period  Weeks    Status  Achieved      PT LONG TERM GOAL #5   Title  Perform ADL's with pain not > 2-3/10.    Time  4    Period  Weeks    Status  Achieved            Plan - 02/15/18 0941    Clinical Impression Statement  Patient presented in clinic with adjustment in PT session due to continued R patella discomfort that has started in the past several weeks per patient report. Patient instructed with standing exercises as well as LAQ in which patient was able to experience more discomfort with descending step or eccentric LAQ motion. Patient has achieved all goals that were set at evaluation as patient not fully completing HEP. Normal modalities response noted following removal of the modalities.    Rehab Potential  Good    PT Frequency  2x / week    PT Duration  4 weeks  PT Treatment/Interventions  ADLs/Self Care Home Management;Cryotherapy;Electrical Stimulation;Ultrasound;Therapeutic activities;Therapeutic exercise;Patient/family education;Neuromuscular re-education;Manual techniques;Passive range of motion;Vasopneumatic Device    PT Next Visit Plan  Continue POC with focus on strengthening, modalities PRN     Consulted and Agree with Plan of Care  Patient       Patient will benefit from skilled therapeutic intervention in order to improve the following  deficits and impairments:  Decreased activity tolerance, Decreased range of motion, Decreased strength, Increased edema, Pain  Visit Diagnosis: Stiffness of left knee, not elsewhere classified  Acute pain of left knee  Localized edema  Muscle weakness (generalized)     Problem List Patient Active Problem List   Diagnosis Date Noted  . Hematoma 11/27/2016  . Severe epistaxis 11/23/2016  . Vertigo 09/09/2016  . Renal vascular disease 10/31/2015  . Nodule of right lung 10/31/2015  . Asthma without status asthmaticus 10/31/2015  . Essential hypertension, malignant   . Hypothyroidism 10/15/2015  . Pancytopenia ([place]) 10/15/2015  . Hyponatremia 10/14/2015  . Thrombocytopenia ([place]) 06/16/2015  . Right renal artery stenosis ([place]) 05/09/2015  . Chronic diastolic heart failure ([place]) 04/03/2014  . Hypertensive urgency 04/02/2014  . Headache 04/02/2014  . CKD (chronic kidney disease) stage 3, GFR 30-59 ml/min ([facility]) 04/02/2014  . Paroxysmal atrial fibrillation ([place]) 01/25/2014  . Hypokalemia 07/19/2013  . Anemia 07/19/2013  . Bilateral lower extremity edema 06/30/2013  . Hypertension with fluid overload 06/30/2013  . Pedal edema 05/10/2013  . Varicose veins of lower extremities with other complications 41/10/129  . ABNORMAL STRESS ELECTROCARDIOGRAM 01/23/2010  . Hyperlipidemia 12/18/2009  . Essential hypertension 12/18/2009  . RENAL INSUFFICIENCY, CHRONIC 12/18/2009  . Arthropathy 12/18/2009    Standley Brooking, PTA 02/15/18 10:30 AM   [facility] [address] [place], [place], [address] Phone: [phone]   Fax:  [phone]  Name: [name] MRN: [id number] Date of Birth: February 07, 1934  PHYSICAL THERAPY DISCHARGE SUMMARY  Visits from Start of Care: 19.  Current functional level related to goals / functional outcomes: See above.   Remaining deficits: Goals met.   Education / Equipment: HEP. Plan:                                                     Patient goals were met. Patient is being discharged due to being pleased with the current functional level.  ?????         Mali Applegate MPT

## 2018-02-16 DIAGNOSIS — N6311 Unspecified lump in the right breast, upper outer quadrant: Secondary | ICD-10-CM | POA: Diagnosis not present

## 2018-02-16 DIAGNOSIS — N6313 Unspecified lump in the right breast, lower outer quadrant: Secondary | ICD-10-CM | POA: Diagnosis not present

## 2018-02-16 DIAGNOSIS — Z4789 Encounter for other orthopedic aftercare: Secondary | ICD-10-CM | POA: Diagnosis not present

## 2018-02-20 ENCOUNTER — Other Ambulatory Visit: Payer: Self-pay | Admitting: Family Medicine

## 2018-02-22 ENCOUNTER — Encounter: Payer: Self-pay | Admitting: Family Medicine

## 2018-02-22 ENCOUNTER — Other Ambulatory Visit: Payer: Self-pay | Admitting: Radiology

## 2018-02-22 DIAGNOSIS — N6313 Unspecified lump in the right breast, lower outer quadrant: Secondary | ICD-10-CM | POA: Diagnosis not present

## 2018-02-22 DIAGNOSIS — C50511 Malignant neoplasm of lower-outer quadrant of right female breast: Secondary | ICD-10-CM | POA: Diagnosis not present

## 2018-02-22 DIAGNOSIS — N6311 Unspecified lump in the right breast, upper outer quadrant: Secondary | ICD-10-CM | POA: Diagnosis not present

## 2018-02-22 DIAGNOSIS — N6011 Diffuse cystic mastopathy of right breast: Secondary | ICD-10-CM | POA: Diagnosis not present

## 2018-02-22 DIAGNOSIS — D0591 Unspecified type of carcinoma in situ of right breast: Secondary | ICD-10-CM | POA: Diagnosis not present

## 2018-02-23 ENCOUNTER — Encounter: Payer: Medicare Other | Admitting: *Deleted

## 2018-02-24 ENCOUNTER — Telehealth: Payer: Self-pay | Admitting: Hematology

## 2018-02-24 NOTE — Telephone Encounter (Signed)
Spoke to patient to confirm afternoon [facility] appointment on 7/17, [facility] patient, letter sent

## 2018-02-25 ENCOUNTER — Encounter: Payer: Self-pay | Admitting: *Deleted

## 2018-02-25 ENCOUNTER — Other Ambulatory Visit: Payer: Self-pay | Admitting: *Deleted

## 2018-02-25 DIAGNOSIS — C50511 Malignant neoplasm of lower-outer quadrant of right female breast: Secondary | ICD-10-CM | POA: Insufficient documentation

## 2018-02-25 DIAGNOSIS — Z170 Estrogen receptor positive status [ER+]: Principal | ICD-10-CM

## 2018-03-02 NOTE — Progress Notes (Signed)
[name]   [phone] Fax:([phone]) [place] Note   Patient Care Team: Chipper Herb, MD as PCP - General (Family Medicine) Rolm Bookbinder, MD as Consulting Physician (General Surgery) Eppie Gibson, MD as Attending Physician (Radiation Oncology) Truitt Merle, MD as Consulting Physician (Hematology) 03/03/2018  CHIEF COMPLAINTS/PURPOSE OF CONSULTATION:  Newly diagnosed right breast cancer  Oncology History   Cancer Staging Malignant neoplasm of lower-outer quadrant of right breast of female, estrogen receptor positive ([place]) Staging form: Breast, AJCC 8th Edition - Clinical stage from 02/22/2018: Stage IA (cT1c, cN0, cM0, G1, ER+, PR+, HER2-) - Signed by Truitt Merle, MD on 03/02/2018      Malignant neoplasm of lower-outer quadrant of right breast of female, estrogen receptor positive ([place])   02/12/2018 Mammogram    She had routine screening bilateral mammography on 02/12/2018 at [facility] with results showing: indeterminate irregular mass in the right breast.       02/16/2018 Mammogram    She underwent right diagnostic mammography with tomography and right breast ultrasonography at [facility] on 02/16/2018 showing: 1.1 cm irregular mass at the 8 O'clock position on the right breast      02/22/2018 Pathology Results    Diagnosis 1. Breast, right, needle core biopsy, lateral, 8 o'clock - INVASIVE AND IN SITU DUCTAL CARCINOMA. - SEE COMMENT. 2. Breast, right, needle core biopsy, 11 o'clock - FIBROCYSTIC CHANGES. - USUAL DUCTAL HYPERPLASIA. - THERE IS NO EVIDENCE OF MALIGNANCY.      02/22/2018 Receptors her2    IMMUNOHISTOCHEMICAL AND MORPHOMETRIC ANALYSIS PERFORMED MANUALLY Estrogen Receptor: 100%, POSITIVE, STRONG STAINING INTENSITY Progesterone Receptor: 40%, POSITIVE, MODERATE STAINING INTENSITY Proliferation Marker Ki67: 1% Her2 Negative      02/22/2018 Cancer Staging    Staging form: Breast, AJCC 8th Edition - Clinical stage from  02/22/2018: Stage IA (cT1c, cN0, cM0, G1, ER+, PR+, HER2-) - Signed by Truitt Merle, MD on 03/02/2018       02/25/2018 Initial Diagnosis    Malignant neoplasm of lower-outer quadrant of right breast of female, estrogen receptor positive ([place])       HISTORY OF PRESENTING ILLNESS:  [name] 84 y.o. female is here because of newly diagnosed breast cancer. She is accompanied by her family members today.   She had routine screening bilateral mammography on 02/12/2018 at [facility] with results showing: indeterminate irregular mass in the right breast. She underwent right diagnostic mammography with tomography and right breast ultrasonography at [facility] on 02/16/2018 showing: 1.1 cm irregular mass at the 8 O'clock position on the right breast.  Biopsy showed invasive ductal carcinoma and DCIS.   She feels well overall.  No changes in appetite or weight. No pain or any other symptoms. She reports easy bruising. She reports having low platelet counts for years now. She still doesn't know why. She saw Dr. Walden Field last month.   She had a knee arthroscope in April. She also had a stent placed in her renal artery. She has HTN and OA in her hands.  She lives with her husband and is able to carry out daily activities normally.    GYN HISTORY Menarchal: 84 yo LMP: 30 years ago Contraceptive: none HRT: yes, doesn't remember how long. Stopped more than 10 years ago. GP: 2 children, first at age 63   MEDICAL HISTORY:  Past Medical History:  Diagnosis Date  . Anemia   . Arthritis   . Asthma   . CKD (chronic kidney disease), stage III ([place])   .  Hyperlipidemia   . Hypertension   . Hypothyroidism   . Nodule of right lung    Right upper lobe  . Renal insufficiency    Chronic  . Renal vascular disease   . Right renal artery stenosis ([place]) 05/09/2015  . Vertigo     SURGICAL HISTORY: Past Surgical History:  Procedure Laterality Date  . IR GENERIC HISTORICAL  08/29/2016   IR US GUIDE VASC ACCESS RIGHT  08/29/2016 [facility]  . IR GENERIC HISTORICAL  08/29/2016   IR RENAL BILAT S&I MOD SED 08/29/2016 [facility]  . THYROIDECTOMY, PARTIAL Right 1981   middle lobe removed 1st, then right lobe removed 7-8 years later (approx. 1988)    SOCIAL HISTORY: Social History   Socioeconomic History  . Marital status: Married    Spouse name: Not on file  . Number of children: 2  . Years of education: 26  . Highest education level: Not on file  Occupational History  . Occupation: Retired  Scientific laboratory technician  . Financial resource strain: Not hard at all  . Food insecurity:    Worry: Never true    Inability: Never true  . Transportation needs:    Medical: No    Non-medical: No  Tobacco Use  . Smoking status: Never Smoker  . Smokeless tobacco: Never Used  . Tobacco comment: Pt denies cigarettes  Substance and Sexual Activity  . Alcohol use: No  . Drug use: No  . Sexual activity: Never  Lifestyle  . Physical activity:    Days per week: 2 days    Minutes per session: 30 min  . Stress: Not at all  Relationships  . Social connections:    Talks on phone: More than three times a week    Gets together: More than three times a week    Attends religious service: More than 4 times per year    Active member of club or organization: Yes    Attends meetings of clubs or organizations: More than 4 times per year    Relationship status: Married  . Intimate partner violence:    Fear of current or ex partner: No    Emotionally abused: No    Physically abused: No    Forced sexual activity: No  Other Topics Concern  . Not on file  Social History Narrative   Lives at home with husband.   Ambidextrous (writes with left hand).   No caffeine use.    FAMILY HISTORY: Family History  Problem Relation Age of Onset  . Thyroid disease Mother   . Stroke Father   . Hypertension Brother   . Lung cancer Maternal Uncle   . Cancer Maternal Grandmother        Liver  . Lung cancer Maternal Uncle   .  Hypertension Daughter   . Hypercholesterolemia Daughter   . Hypercholesterolemia Daughter     ALLERGIES:  is allergic to amlodipine; isosorbide nitrate; other; sulfonamide derivatives; and terazosin.  MEDICATIONS:  Current Outpatient Medications  Medication Sig Dispense Refill  . atenolol-chlorthalidone (TENORETIC) 50-25 MG tablet TAKE 1 TABLET BY MOUTH DAILY. 90 tablet 0  . atorvastatin (LIPITOR) 20 MG tablet TAKE 1/2 TABLET DAILY 90 tablet 1  . calcium-vitamin D (OSCAL WITH D) 500-200 MG-UNIT per tablet Take 1 tablet by mouth at bedtime.     . cholecalciferol (VITAMIN D) 1000 units tablet Take 1,000 Units by mouth at bedtime.     . flecainide (TAMBOCOR) 50 MG tablet TAKE 1 TABLET (50 MG TOTAL)  BY MOUTH 2 (TWO) TIMES DAILY. 180 tablet 60  . hydrALAZINE (APRESOLINE) 50 MG tablet Take 100 mg by mouth 2 (two) times daily.     . hydroxypropyl methylcellulose / hypromellose (ISOPTO TEARS / GONIOVISC) 2.5 % ophthalmic solution Place 1 drop into both eyes daily.     Marland Kitchen levothyroxine (SYNTHROID, LEVOTHROID) 75 MCG tablet TAKE 1 TABLET (75 MCG TOTAL) BY MOUTH DAILY. 90 tablet 3  . lisinopril (PRINIVIL,ZESTRIL) 40 MG tablet TAKE 1 TABLET BY MOUTH TWICE A DAY 60 tablet 4  . aspirin EC 81 MG tablet Take 81 mg by mouth See admin instructions. Take 1 tablet (81 mg) by mouth every other night    . fluticasone (FLONASE) 50 MCG/ACT nasal spray Place 1 spray into both nostrils 2 (two) times daily as needed for allergies or rhinitis.     . fluticasone (FLONASE) 50 MCG/ACT nasal spray SPRAY 2 SPRAYS INTO EACH NOSTRIL EVERY DAY (Patient not taking: Reported on 03/03/2018) 48 g 2  . Omega-3 Fatty Acids (FISH OIL) 1200 MG CAPS Take 1,200 mg by mouth daily.    . sodium chloride (OCEAN) 0.65 % SOLN nasal spray Place 1 spray into both nostrils 2 (two) times daily.      No current facility-administered medications for this visit.     REVIEW OF SYSTEMS:   Constitutional: Denies fevers, chills or abnormal night  sweats Eyes: Denies blurriness of vision, double vision or watery eyes Ears, nose, mouth, throat, and face: Denies mucositis or sore throat Respiratory: Denies cough, dyspnea or wheezes Cardiovascular: Denies palpitation, chest discomfort or lower extremity swelling Gastrointestinal:  Denies nausea, heartburn or change in bowel habits Skin: Denies abnormal skin rashes (+) bruising Lymphatics: Denies new lymphadenopathy or easy bruising Neurological:Denies numbness, tingling or new weaknesses MSK: (+) hand OA, not receiving treatment  Behavioral/Psych: Mood is stable, no new changes  All other systems were reviewed with the patient and are negative.  PHYSICAL EXAMINATION:  ECOG PERFORMANCE STATUS: 1 - Symptomatic but completely ambulatory  Vitals:   03/03/18 1305  BP: (!) 205/66  Pulse: 64  Resp: 17  Temp: 98.1 F (36.7 C)  SpO2: 98%   Filed Weights   03/03/18 1305  Weight: 123 lb 14.4 oz (56.2 kg)    GENERAL:alert, no distress and comfortable SKIN: skin color, texture, turgor are normal, no rashes or significant lesions EYES: normal, conjunctiva are pink and non-injected, sclera clear OROPHARYNX:no exudate, no erythema and lips, buccal mucosa, and tongue normal  NECK: supple, thyroid normal size, non-tender, without nodularity LYMPH:  no palpable lymphadenopathy in the cervical, axillary or inguinal LUNGS: clear to auscultation and percussion with normal breathing effort HEART: regular rate & rhythm and no murmurs and no lower extremity edema ABDOMEN:abdomen soft, non-tender and normal bowel sounds Musculoskeletal:no cyanosis of digits and no clubbing (+) brusing in LL BREAST: (+) tenderness at biopsy site 1x4cm UOQ of the right breast PSYCH: alert & oriented x 3 with fluent speech NEURO: no focal motor/sensory deficits  LABORATORY DATA:  I have reviewed the data as listed CBC Latest Ref Rng & Units 03/03/2018 01/29/2018 01/20/2018  WBC 3.9 - 10.3 K/uL 2.7(L) 3.5(L) -    Hemoglobin 11.6 - 15.9 g/dL 10.7(L) 10.2(L) -  Hematocrit 34.8 - 46.6 % 31.6(L) 31.3(L) -  Platelets 145 - 400 K/uL 136(L) 133(L) 116(L)    CMP Latest Ref Rng & Units 03/03/2018 01/29/2018 01/12/2018  Glucose 70 - 99 mg/dL 132(H) 105(H) 99  BUN 8 - 23 mg/dL 27(H) 24(H) 19  Creatinine 0.44 - 1.00 mg/dL 1.57(H) 1.26(H) 1.23(H)  Sodium 135 - 145 mmol/L 138 136 142  Potassium 3.5 - 5.1 mmol/L 3.9 4.0 3.9  Chloride 98 - 111 mmol/L 99 97(L) 97  CO2 22 - 32 mmol/L 32 30 28  Calcium 8.9 - 10.3 mg/dL 9.4 9.7 9.3  Total Protein 6.5 - 8.1 g/dL 7.4 7.7 7.2  Total Bilirubin 0.3 - 1.2 mg/dL 0.5 0.8 0.5  Alkaline Phos 38 - 126 U/L 72 63 74  AST 15 - 41 U/L '19 20 17  ' ALT 0 - 44 U/L '14 15 9   ' CBC    Component Value Date/Time   WBC 2.7 (L) 03/03/2018 1214   WBC 3.5 (L) 01/29/2018 1141   RBC 3.70 03/03/2018 1214   HGB 10.7 (L) 03/03/2018 1214   HGB WILL FOLLOW 01/18/2018 1050   HCT 31.6 (L) 03/03/2018 1214   HCT WILL FOLLOW 01/18/2018 1050   PLT 136 (L) 03/03/2018 1214   PLT WILL FOLLOW 01/18/2018 1050   MCV 85.4 03/03/2018 1214   MCV WILL FOLLOW 01/18/2018 1050   MCH 28.9 03/03/2018 1214   MCHC 33.9 03/03/2018 1214   RDW 15.1 (H) 03/03/2018 1214   RDW WILL FOLLOW 01/18/2018 1050   LYMPHSABS 0.6 (L) 03/03/2018 1214   LYMPHSABS WILL FOLLOW 01/18/2018 1050   MONOABS 0.3 03/03/2018 1214   EOSABS 0.0 03/03/2018 1214   EOSABS WILL FOLLOW 01/18/2018 1050   BASOSABS 0.0 03/03/2018 1214   BASOSABS WILL FOLLOW 01/18/2018 1050     PATHOLOGY:   02/22/2018 Surgical Pathology 1. PROGNOSTIC INDICATORS Results: IMMUNOHISTOCHEMICAL AND MORPHOMETRIC ANALYSIS PERFORMED MANUALLY Estrogen Receptor: 100%, POSITIVE, STRONG STAINING INTENSITY Progesterone Receptor: 40%, POSITIVE, MODERATE STAINING INTENSITY Proliferation Marker Ki67: 1% REFERENCE RANGE ESTROGEN RECEPTOR NEGATIVE 0% POSITIVE =>1% REFERENCE RANGE PROGESTERONE RECEPTOR NEGATIVE 0% POSITIVE =>1% All controls stained appropriately 1.  FLUORESCENCE IN-SITU HYBRIDIZATION Results: HER2 - NEGATIVE RATIO OF HER2/CEP17 SIGNALS 1.31 AVERAGE HER2 COPY NUMBER PER CELL 2.10 Reference Range: NEGATIVE HER2/CEP17 Ratio <2.0 and average HER2 copy number <4.0 EQUIVOCAL HER2/CEP17 Ratio <2.0 and average HER2 copy number >=4.0 and <6.0  Diagnosis 1. Breast, right, needle core biopsy, lateral, 8 o'clock - INVASIVE AND IN SITU DUCTAL CARCINOMA. - SEE COMMENT. 2. Breast, right, needle core biopsy, 11 o'clock - FIBROCYSTIC CHANGES. - USUAL DUCTAL HYPERPLASIA. - THERE IS NO EVIDENCE OF MALIGNANCY. Microscopic Comment 1. Most of the carcinoma is in situ. The invasive component appears grade I. A breast prognostic profile will be attempted on the invasive component and the results reported separately. The results are called to [facility] on 02/23/18.ecimen Gross and Clinical Information Specimen Comment 1. Time in formalin: 1:50 PM 2. Time in formalin: 2:00 PM Specimen(s) Obtained: 1. Breast, right, needle core biopsy, lateral, 8 o'clock 2. Breast, right, needle core biopsy, 11 o'clock Specimen Clinical Information 2. ? Papillomas ? Gross 1. Received labeled as "Brennen,Axelle" and "Rt" (TIF 1350 CIT 5 min) are 3 cores of yellow to gray white soft tissue, ranging from 1 x 0.1 x 0.1 cm to 1.4 x 0.1 x 0.1 cm. One block submitted. (SSW 7/8) 2. Received labeled as "Wisinski,Jolene" and "Rt #2" (TIF 1420 CIT 5 min) are 2 cores of yellow to gray white soft tissue, each 1.2 x 0.15 x 0.15 cm. One block submitted. (SSW 7/8) Stain(s) used in Diagnosis: The following stain(s) were used in diagnosing the case: Her2 FISH, ER-ACIS, PR-ACIS, KI-67-ACIS. The control(s) stained appropriately.  01/30/2010 Surgical Pathology 1. BREAST, LEFT, NEEDLE CORE BIOPSY, 9  O''CLOCK, 3CM FROM NIPPLE : - FIBROCYSTIC CHANGES, MILD. - usual ductal hyperplasia, focal. - there is no evidence of malignancy.  RADIOGRAPHIC STUDIES:  02/16/2018 Breast  US   02/16/2018 Right Mammogram   02/12/2018 Bilateral Mammogram    I have personally reviewed the radiological images as listed and agreed with the findings in the report. No results found.  ASSESSMENT & PLAN:  DONETA BAYMAN is a 84 y.o. lovely female with a history of pancytopenia, arthritis, asthma, CKD Stage III, HLD, HTN, hypothyroidism, right lung nodule, right renal artery stenosis, and vertigo.  1. Malignant neoplasm of lower-outer quadrant of right breast of female, Stage IA, cT1c, cN0, cM0, G1, ER+, PR+, HER2-  -She had routine screening bilateral mammography on 02/12/2018 at [facility] with results showing: indeterminate irregular mass in the right breast. She underwent right diagnostic mammography with tomography and right breast ultrasonography at [facility] on 02/16/2018 showing: 1.1 cm irregular mass at the 8 O'clock position on the right breast -Accordingly on 02/22/2018 she proceeded to right breast biopsy with pathology showing: invasive and in situ ductal carcinoma at 8 O'clock position. Prognostic indicators significant for: ER, 100% positive with strong staining intensity and PR, 40% positive with moderate staining intensity. Proliferation marker Ki67 at 1%. HER2 negative. -I have reviewed his above imaging and biopsy results with patient and her family members in great detail. -Based on the size of her tumor, she is a candidate for lumpectomy.  Due to her advanced age, normal-appearing lymph nodes on ultrasound, sentinel lymph node biopsy was not recommended.  She was seen by Dr. Donne Hazel today. -Given the strong ER PR positive, 2- disease, low Ki-67, stage disease, her risk of recurrence is likely low.  Due to her advanced age, I do not recommend Oncotype or chemotherapy. --Given the strong ER and PR positivity, I do recommend adjuvant aromatase inhibitor to reduce her risk of cancer recurrence,  The potential benefit and side effects, which includes but not limited to, hot flash, skin  and vaginal dryness, metabolic changes ( increased blood glucose, cholesterol, weight, etc.), slightly in increased risk of cardiovascular disease, cataracts, muscular and joint discomfort, osteopenia and osteoporosis, etc, were discussed with her in great details. She is interested, and we'll start after she completes radiation. -She was seen by radiation oncologist Dr. Isidore Moos today, adjuvant radiation was discussed. -We will proceed with surgery next.   2. HTN, CKD stage III -F/U with PCP  3. Pancytopenia  -she was seen by Dr. Walden Field in 01/2018, and will f/u to review test results soon    PLAN: -She will proceed lumpectomy by Dr. Donne Hazel soon -I plan to see her back after she completes adjuvant radiation -I do not plan to do Oncotype   No orders of the defined types were placed in this encounter.   All questions were answered. The patient knows to call the clinic with any problems, questions or concerns. I spent 40 minutes counseling the patient face to face. The total time spent in the appointment was 50 minutes and more than 50% was on counseling.     Truitt Merle, MD 03/03/2018   Dierdre Searles Dweik am acting as scribe for Dr. Truitt Merle.   I have reviewed the above documentation for accuracy and completeness, and I agree with the above.

## 2018-03-03 ENCOUNTER — Inpatient Hospital Stay: Payer: PPO | Attending: Hematology | Admitting: Hematology

## 2018-03-03 ENCOUNTER — Ambulatory Visit
Admission: RE | Admit: 2018-03-03 | Discharge: 2018-03-03 | Disposition: A | Discharge status: Home / Self Care | Payer: PPO | Source: Ambulatory Visit | Attending: Radiation Oncology | Admitting: Radiation Oncology

## 2018-03-03 ENCOUNTER — Encounter: Payer: Self-pay | Admitting: Radiation Oncology

## 2018-03-03 ENCOUNTER — Ambulatory Visit: Payer: PPO | Admitting: Physical Therapy

## 2018-03-03 ENCOUNTER — Encounter: Payer: Self-pay | Admitting: Hematology

## 2018-03-03 ENCOUNTER — Inpatient Hospital Stay: Payer: PPO

## 2018-03-03 VITALS — BP 205/66 | HR 64 | Temp 98.1°F | Resp 17 | Ht 65.25 in | Wt 123.9 lb

## 2018-03-03 DIAGNOSIS — Z808 Family history of malignant neoplasm of other organs or systems: Secondary | ICD-10-CM | POA: Diagnosis not present

## 2018-03-03 DIAGNOSIS — C50511 Malignant neoplasm of lower-outer quadrant of right female breast: Secondary | ICD-10-CM

## 2018-03-03 DIAGNOSIS — Z79899 Other long term (current) drug therapy: Secondary | ICD-10-CM

## 2018-03-03 DIAGNOSIS — D61818 Other pancytopenia: Secondary | ICD-10-CM

## 2018-03-03 DIAGNOSIS — Z170 Estrogen receptor positive status [ER+]: Secondary | ICD-10-CM | POA: Diagnosis not present

## 2018-03-03 DIAGNOSIS — I129 Hypertensive chronic kidney disease with stage 1 through stage 4 chronic kidney disease, or unspecified chronic kidney disease: Secondary | ICD-10-CM

## 2018-03-03 DIAGNOSIS — N183 Chronic kidney disease, stage 3 (moderate): Secondary | ICD-10-CM

## 2018-03-03 LAB — CBC WITH DIFFERENTIAL (CANCER CENTER ONLY)
BASOS PCT: 1 %
Basophils Absolute: 0 10*3/uL (ref 0.0–0.1)
Eosinophils Absolute: 0 10*3/uL (ref 0.0–0.5)
Eosinophils Relative: 1 %
HEMATOCRIT: 31.6 % — AB (ref 34.8–46.6)
HEMOGLOBIN: 10.7 g/dL — AB (ref 11.6–15.9)
LYMPHS ABS: 0.6 10*3/uL — AB (ref 0.9–3.3)
Lymphocytes Relative: 24 %
MCH: 28.9 pg (ref 25.1–34.0)
MCHC: 33.9 g/dL (ref 31.5–36.0)
MCV: 85.4 fL (ref 79.5–101.0)
Monocytes Absolute: 0.3 10*3/uL (ref 0.1–0.9)
Monocytes Relative: 10 %
NEUTROS ABS: 1.7 10*3/uL (ref 1.5–6.5)
NEUTROS PCT: 64 %
Platelet Count: 136 10*3/uL — ABNORMAL LOW (ref 145–400)
RBC: 3.7 MIL/uL (ref 3.70–5.45)
RDW: 15.1 % — ABNORMAL HIGH (ref 11.2–14.5)
WBC Count: 2.7 10*3/uL — ABNORMAL LOW (ref 3.9–10.3)

## 2018-03-03 LAB — CMP (CANCER CENTER ONLY)
ALBUMIN: 4 g/dL (ref 3.5–5.0)
ALK PHOS: 72 U/L (ref 38–126)
ALT: 14 U/L (ref 0–44)
ANION GAP: 7 (ref 5–15)
AST: 19 U/L (ref 15–41)
BILIRUBIN TOTAL: 0.5 mg/dL (ref 0.3–1.2)
BUN: 27 mg/dL — ABNORMAL HIGH (ref 8–23)
CALCIUM: 9.4 mg/dL (ref 8.9–10.3)
CO2: 32 mmol/L (ref 22–32)
CREATININE: 1.57 mg/dL — AB (ref 0.44–1.00)
Chloride: 99 mmol/L (ref 98–111)
GFR, Est AFR Am: 34 mL/min — ABNORMAL LOW (ref >60)
GFR, Estimated: 30 mL/min — ABNORMAL LOW (ref >60)
Glucose, Bld: 132 mg/dL — ABNORMAL HIGH (ref 70–99)
Potassium: 3.9 mmol/L (ref 3.5–5.1)
Sodium: 138 mmol/L (ref 135–145)
TOTAL PROTEIN: 7.4 g/dL (ref 6.5–8.1)

## 2018-03-03 NOTE — Progress Notes (Addendum)
Radiation Oncology         [phone] ________________________________  Initial outpatient Consultation  Name: [name] MRN: [id number]  Date: 03/03/2018  DOB: [birth year]  CC:[name]  [name]   REFERRING PHYSICIAN: Rolm Bookbinder, [name]  DIAGNOSIS:    ICD-10-CM   1. Malignant neoplasm of lower-outer quadrant of right breast of female, estrogen receptor positive ([place]) C50.511    Z17.0   Cancer Staging Malignant neoplasm of lower-outer quadrant of right breast of female, estrogen receptor positive ([place]) Staging form: Breast, AJCC 8th Edition - Clinical stage from 02/22/2018: Stage IA (cT1c, cN0, cM0, G1, ER+, PR+, HER2-) - Signed by Truitt Merle, [name] on 03/02/2018   Stage IA Right Breast LOQ Invasive Carcinoma with DCIS, ER+ / PR+ / Her2-, Grade 1  CHIEF COMPLAINT: Here to discuss management of right breast cancer  HISTORY OF PRESENT ILLNESS::[name] is a 84 y.o. female who presented with two breast masses on mammography.   She had a routine screening mammography on 02/12/18 showing indeterminate oval mass in right breast lower inner aspect posterior depth. She underwent bilateral diagnostic mammography with tomography and right breast ultrasonography at [facility] on 02/16/18 showing 2 masses: highly suggestive 1.1 cm irregular mass with an angular margin in the right breast at 8 o'clock middle depth of malignancy; suspicious 0.5 cm irregular intraductal mass in right breast at 11 o'clock anterior depth of malignancy.  On 02/22/18 she proceeded to right needle core biopsy showing: lateral, 8 o'clock, invasive and in situ ductal carcinoma; 11 o'clock, fibrocystic changes, usual ductal hyperplasia, no evidence of malignancy. Prognostic indicators significant for: ER, 100% positive with strong staining intensity and PR, 40% positive with moderate staining intensity. Proliferation marker Ki67 at 1%. HER2 negative.  Menarche: 84 years old Age at first live birth:  84 years old GP: 2 LMP: 30 years ago Contraceptive: none HRT: yes, stopped in 1990's  On review of systems, patient notes bruising easily. Patient denies any other symptoms.   PREVIOUS RADIATION THERAPY: No  PAST MEDICAL HISTORY:  has a past medical history of Anemia, Arthritis, Asthma, CKD (chronic kidney disease), stage III ([place]), Hyperlipidemia, Hypertension, Hypothyroidism, Nodule of right lung, Renal insufficiency, Renal vascular disease, Right renal artery stenosis ([place]) (05/09/2015), and Vertigo.    PAST SURGICAL HISTORY: Past Surgical History:  Procedure Laterality Date  . IR GENERIC HISTORICAL  08/29/2016   IR US GUIDE VASC ACCESS RIGHT 08/29/2016 [facility]  . IR GENERIC HISTORICAL  08/29/2016   IR RENAL BILAT S&I MOD SED 08/29/2016 [facility]  . THYROIDECTOMY, PARTIAL Right 1981   middle lobe removed 1st, then right lobe removed 7-8 years later (approx. 1988)    FAMILY HISTORY: family history includes Cancer in her maternal grandmother; Hypercholesterolemia in her daughter and daughter; Hypertension in her brother and daughter; Lung cancer in her maternal uncle and maternal uncle; Stroke in her father; Thyroid disease in her mother.  SOCIAL HISTORY:  reports that she has never smoked. She has never used smokeless tobacco. She reports that she does not drink alcohol or use drugs.  ALLERGIES: Amlodipine; Isosorbide nitrate; Other; Sulfonamide derivatives; and Terazosin  MEDICATIONS:  Current Outpatient Medications  Medication Sig Dispense Refill  . aspirin EC 81 MG tablet Take 81 mg by mouth See admin instructions. Take 1 tablet (81 mg) by mouth every other night    . atenolol-chlorthalidone (TENORETIC) 50-25 MG tablet TAKE 1 TABLET BY MOUTH DAILY. 90 tablet 0  . atorvastatin (LIPITOR) 20  MG tablet TAKE 1/2 TABLET DAILY 90 tablet 1  . calcium-vitamin D (OSCAL WITH D) 500-200 MG-UNIT per tablet Take 1 tablet by mouth at bedtime.     . cholecalciferol (VITAMIN D) 1000  units tablet Take 1,000 Units by mouth at bedtime.     . flecainide (TAMBOCOR) 50 MG tablet TAKE 1 TABLET (50 MG TOTAL) BY MOUTH 2 (TWO) TIMES DAILY. 180 tablet 60  . fluticasone (FLONASE) 50 MCG/ACT nasal spray Place 1 spray into both nostrils 2 (two) times daily as needed for allergies or rhinitis.     . fluticasone (FLONASE) 50 MCG/ACT nasal spray SPRAY 2 SPRAYS INTO EACH NOSTRIL EVERY DAY (Patient not taking: Reported on 03/03/2018) 48 g 2  . hydrALAZINE (APRESOLINE) 50 MG tablet Take 100 mg by mouth 2 (two) times daily.     . hydroxypropyl methylcellulose / hypromellose (ISOPTO TEARS / GONIOVISC) 2.5 % ophthalmic solution Place 1 drop into both eyes daily.     Marland Kitchen levothyroxine (SYNTHROID, LEVOTHROID) 75 MCG tablet TAKE 1 TABLET (75 MCG TOTAL) BY MOUTH DAILY. 90 tablet 3  . lisinopril (PRINIVIL,ZESTRIL) 40 MG tablet TAKE 1 TABLET BY MOUTH TWICE A DAY 60 tablet 4  . Omega-3 Fatty Acids (FISH OIL) 1200 MG CAPS Take 1,200 mg by mouth daily.    . sodium chloride (OCEAN) 0.65 % SOLN nasal spray Place 1 spray into both nostrils 2 (two) times daily.      No current facility-administered medications for this encounter.     REVIEW OF SYSTEMS: A 10+ POINT REVIEW OF SYSTEMS WAS OBTAINED including neurology, dermatology, psychiatry, cardiac, respiratory, lymph, extremities, GI, GU, Musculoskeletal, constitutional, breasts, reproductive, HEENT.  All pertinent positives are noted in the HPI.  All others are negative.   PHYSICAL EXAM: Vitals with BMI 03/03/2018  Height 5' 5.25"  Weight 123 lbs 14 oz  BMI 16.10  Systolic 960  Diastolic 66  Pulse 64  Respirations 17  General: Alert and oriented, in no acute distress HEENT: Head is normocephalic. Extraocular movements are intact. Oropharynx is clear. Neck: Neck is supple, no palpable cervical or supraclavicular lymphadenopathy. Subtle fullness, of right sternocleidomastoid muscle - no mass. Scar from thyroidectomy.  Heart: systolic murmur throughout the  heart,   regular rhythm /  rate. Chest: Clear to auscultation bilaterally, with no rhonchi, wheezes, or rales. Abdomen: Soft, nontender, nondistended, with no rigidity or guarding. Extremities: No cyanosis or edema. Lymphatics: see Neck Exam Skin: Extensive ecchymoses over the forearms and the right breast.  Musculoskeletal: symmetric strength and muscle tone throughout. Neurologic: Cranial nerves II through XII are grossly intact. No obvious focalities. Speech is fluent. Coordination is intact. Psychiatric: Judgment and insight are intact. Affect is appropriate. Breasts: palpable masses at 11 o'clock and 7 o'clock of the right breast, related to previous biopsies. The 11 o'clock lesion is at least 2 cm, and the 7 o'clock is at least 1 cm. No other palpable masses appreciated in the breasts or axillae.  ECOG = 1  0 - Asymptomatic (Fully active, able to carry on all predisease activities without restriction)  1 - Symptomatic but completely ambulatory (Restricted in physically strenuous activity but ambulatory and able to carry out work of a light or sedentary nature. For example, light housework, office work)  2 - Symptomatic, <50% in bed during the day (Ambulatory and capable of all self care but unable to carry out any work activities. Up and about more than 50% of waking hours)  3 - Symptomatic, >50% in bed, but  not bedbound (Capable of only limited self-care, confined to bed or chair 50% or more of waking hours)  4 - Bedbound (Completely disabled. Cannot carry on any self-care. Totally confined to bed or chair)  5 - Death   Eustace Pen MM, Creech RH, Tormey DC, et al. [phone]). "Toxicity and response criteria of the [facility]". [place] Oncol. 5 (6): 649-55   LABORATORY DATA:  Lab Results  Component Value Date   WBC 2.7 (L) 03/03/2018   HGB 10.7 (L) 03/03/2018   HCT 31.6 (L) 03/03/2018   MCV 85.4 03/03/2018   PLT 136 (L) 03/03/2018   CMP     Component  Value Date/Time   NA 138 03/03/2018 1214   NA 142 01/12/2018 1405   K 3.9 03/03/2018 1214   CL 99 03/03/2018 1214   CO2 32 03/03/2018 1214   GLUCOSE 132 (H) 03/03/2018 1214   BUN 27 (H) 03/03/2018 1214   BUN 19 01/12/2018 1405   CREATININE 1.57 (H) 03/03/2018 1214   CREATININE 1.37 (H) 02/22/2013 1133   CALCIUM 9.4 03/03/2018 1214   PROT 7.4 03/03/2018 1214   PROT 7.2 01/12/2018 1405   ALBUMIN 4.0 03/03/2018 1214   ALBUMIN 4.3 01/12/2018 1405   AST 19 03/03/2018 1214   ALT 14 03/03/2018 1214   ALKPHOS 72 03/03/2018 1214   BILITOT 0.5 03/03/2018 1214   GFRNONAA 30 (L) 03/03/2018 1214   GFRNONAA 37 (L) 02/22/2013 1133   GFRAA 34 (L) 03/03/2018 1214   GFRAA 43 (L) 02/22/2013 1133         RADIOGRAPHY: No results found.    IMPRESSION/PLAN:  Early stage ER + breast cancer  She has been discussed at our multidisciplinary tumor board.  The consensus is that she would be a good candidate for breast conservation. I talked to her about the option of a mastectomy and informed her that her expected overall survival would be equivalent between mastectomy and breast conservation, based upon randomized controlled data. She is enthusiastic about breast conservation.  For the patient's early stage favorable risk breast cancer, we had a thorough discussion about her options for adjuvant therapy. One option would be antiestrogen therapy as discussed with medical oncology. She would take a pill for approximately 5 years. The alternative option would be radiotherapy to the breast and then taking the pill.   Of note, I discussed the data from the [employer] al trial in the [place] [facility]. She understands that tamoxifen compared to radiation plus tamoxifen demonstrated no survival benefit among the women in this study. The women were 63 years or older with stage I estrogen receptor positive breast cancer.   I told Ms. Loudon that her overall life expectancy should not be affected by  adding radiotherapy to antiestrogen medication. She understands that the main benefit of radiotherapy would be a very small but measurable local control benefit (risk of local recurrence to be lowered from ~10% --> ~2%).  We discussed the risks benefits and side effects of radiotherapy.  We discussed that radiation would take approximately 3-4 weeks to complete and that I would give the patient a few weeks to heal following surgery before starting treatment planning. We spoke about acute effects including skin irritation and fatigue as well as much less common late effects including internal organ injury or irritation. We spoke about the latest technology that is used to minimize the risk of late effects for patients undergoing radiotherapy to the breast or chest wall.  No guarantees of treatment were given. The patient would like to defer radiotherapy as long as her stage remains T1N0 and her margins are negative.  I think this is a fine choice.  Plan: 1. Right Lumpectomy 2. Aromasin Inhibitor  Patient is chronically hypertensive with fluctuating BP over time.  Defer to her PCP on management.    __________________________________________   Eppie Gibson, [name]

## 2018-03-03 NOTE — Progress Notes (Signed)
Nutrition Assessment  Reason for Assessment:  Pt seen in Breast Clinic  ASSESSMENT:   84 year old female with new diagnosis breast cancer.  Past medical history reviewed.  Patient reports appetite has decreased over the last few years.  Medications:  reviewed  Labs: reviewed  Anthropometrics:   Height: 65 inches Weight: 124 lb 1.6 oz BMI: 20.5   NUTRITION DIAGNOSIS: Food and nutrition related knowledge deficit related to new diagnosis of breast cancer as evidenced by no prior need for nutrition related information.  INTERVENTION:   Discussed and provided packet of information regarding nutritional tips for breast cancer patients.  Questions answered.  Teachback method used.  Contact information provided and patient knows to contact me with questions/concerns.    MONITORING, EVALUATION, and GOAL: Pt will consume a healthy plant based diet to maintain lean body mass throughout treatment.   Caige Almeda B. Zenia Resides, [place], [place] Registered Dietitian [phone] (pager)

## 2018-03-05 ENCOUNTER — Inpatient Hospital Stay (HOSPITAL_COMMUNITY): Payer: PPO | Attending: Internal Medicine | Admitting: Internal Medicine

## 2018-03-05 ENCOUNTER — Encounter (HOSPITAL_COMMUNITY): Payer: Self-pay | Admitting: Internal Medicine

## 2018-03-05 VITALS — BP 157/60 | HR 54 | Temp 98.2°F | Resp 14 | Wt 125.1 lb

## 2018-03-05 DIAGNOSIS — D61818 Other pancytopenia: Secondary | ICD-10-CM | POA: Diagnosis not present

## 2018-03-05 DIAGNOSIS — N183 Chronic kidney disease, stage 3 (moderate): Secondary | ICD-10-CM

## 2018-03-05 DIAGNOSIS — Z79899 Other long term (current) drug therapy: Secondary | ICD-10-CM | POA: Diagnosis not present

## 2018-03-05 DIAGNOSIS — Z170 Estrogen receptor positive status [ER+]: Secondary | ICD-10-CM

## 2018-03-05 DIAGNOSIS — I129 Hypertensive chronic kidney disease with stage 1 through stage 4 chronic kidney disease, or unspecified chronic kidney disease: Secondary | ICD-10-CM | POA: Diagnosis not present

## 2018-03-05 DIAGNOSIS — C50511 Malignant neoplasm of lower-outer quadrant of right female breast: Secondary | ICD-10-CM | POA: Diagnosis not present

## 2018-03-05 NOTE — Progress Notes (Signed)
Diagnosis Pancytopenia ([place]) - Plan: CBC with Differential/Platelet, Comprehensive metabolic panel, Lactate dehydrogenase  Malignant neoplasm of lower-outer quadrant of right breast of female, estrogen receptor positive ([place]) - Plan: CBC with Differential/Platelet, Comprehensive metabolic panel, Lactate dehydrogenase  Staging Cancer Staging Malignant neoplasm of lower-outer quadrant of right breast of female, estrogen receptor positive ([place]) Staging form: Breast, AJCC 8th Edition - Clinical stage from 02/22/2018: Stage IA (cT1c, cN0, cM0, G1, ER+, PR+, HER2-) - Signed by Truitt Merle, MD on 03/02/2018   Assessment and Plan:  1.  Leukopenia. Labs done 03/03/2018 reviewed with pt and showed WBC 2.7, HB 10.7 plts 136,000.  Counts are adequate.  She will RTC 6-8 weeks after surgery for repeat labs and to go over pathology.  If counts worsen, she will be given option of bone marrow biopsy for definitive diagnosis due to leukopenia.    2.  Breast cancer.  Pt has screening mammogram done 02/12/2018 that showed mass in the right breast that was indeterminate.  She denied feeling any abnormalities in the breast.  She subsequently had right breast diagnostic mammogram with USN done 02/16/2018 that showed 1.1 cm mass in the right breast at the 8:00 position suggestive of malignancy.  She also had a 0.5 cm intraductal mass in the right breast at the 11:00 position suspicious for malignancy.  Biopsy was recommended.    The patient underwent biopsy of the 2 areas of concern on 02/22/2018 with pathology from the right breast core biopsy at the 8 o'oclock returning as Invasive and in situ ductal carcinoma.  Core biopsy of the 11 o'clock position returned as fibrocystic changes.  The invasive component was Grade 1.  Endocrine panel showed tumor was ER+ 100%, PR + 40%, Her 2 negative by FISH.    She is scheduled for surgery with Dr. Donne Hazel.  I have discussed with her she has evidence of early stage HR+ right breast cancer.   She will RTC to go over pathology.  I have discussed with her likely options of adjuvant endocrine therapy once pathology has been reviewed.    3.  Thrombocytopenia.  Plt count is adequate at 136,000.  She will have repeat labs in 6-8 weeks.    4.  Easy bleeding and bruising.  She denies any family history of bleeding disorders or bleeding with procedures.  Pt had Coagulation studies done 01/29/2018 showed Normal PT 13.5 with PTT of 40.  ANA and VWF profile were normal.  Pt reports she is now holding ASA and Fish oil.  Work up has been negative for common bleeding disorders.    5  HTN. BP is 157/60.  Follow-up with PCP for management.    Interval History: Historical data obtained from note dated 01/29/2018.  83 yr old female who was last seen at [facility] in 2018 for Leukopenia and thrombocytopenia.  Pt did not keep her follow-up.    Current Status:  Pt is seen today for follow-up.  She is here to go over recent labs.  She reports she has been diagnosed with breast cancer and is planned for surgery with Dr. Donne Hazel.    Oncology History   Cancer Staging Malignant neoplasm of lower-outer quadrant of right breast of female, estrogen receptor positive ([place]) Staging form: Breast, AJCC 8th Edition - Clinical stage from 02/22/2018: Stage IA (cT1c, cN0, cM0, G1, ER+, PR+, HER2-) - Signed by Truitt Merle, MD on 03/02/2018      Malignant neoplasm of lower-outer quadrant of right breast of female, estrogen receptor positive (  [place])   02/12/2018 Mammogram    She had routine screening bilateral mammography on 02/12/2018 at [facility] with results showing: indeterminate irregular mass in the right breast.       02/16/2018 Mammogram    She underwent right diagnostic mammography with tomography and right breast ultrasonography at [facility] on 02/16/2018 showing: 1.1 cm irregular mass at the 8 O'clock position on the right breast      02/22/2018 Pathology Results    Diagnosis 1. Breast, right, needle core biopsy, lateral, 8  o'clock - INVASIVE AND IN SITU DUCTAL CARCINOMA. - SEE COMMENT. 2. Breast, right, needle core biopsy, 11 o'clock - FIBROCYSTIC CHANGES. - USUAL DUCTAL HYPERPLASIA. - THERE IS NO EVIDENCE OF MALIGNANCY.      02/22/2018 Receptors her2    IMMUNOHISTOCHEMICAL AND MORPHOMETRIC ANALYSIS PERFORMED MANUALLY Estrogen Receptor: 100%, POSITIVE, STRONG STAINING INTENSITY Progesterone Receptor: 40%, POSITIVE, MODERATE STAINING INTENSITY Proliferation Marker Ki67: 1% Her2 Negative      02/22/2018 Cancer Staging    Staging form: Breast, AJCC 8th Edition - Clinical stage from 02/22/2018: Stage IA (cT1c, cN0, cM0, G1, ER+, PR+, HER2-) - Signed by Truitt Merle, MD on 03/02/2018       02/25/2018 Initial Diagnosis    Malignant neoplasm of lower-outer quadrant of right breast of female, estrogen receptor positive ([place])        Problem List Patient Active Problem List   Diagnosis Date Noted  . Malignant neoplasm of lower-outer quadrant of right breast of female, estrogen receptor positive ([place]) [C50.511, Z17.0] 02/25/2018  . Hematoma [T14.8XXA] 11/27/2016  . Severe epistaxis [R04.0] 11/23/2016  . Vertigo [R42] 09/09/2016  . Renal vascular disease [N28.89] 10/31/2015  . Nodule of right lung [R91.1] 10/31/2015  . Asthma without status asthmaticus [J45.909] 10/31/2015  . Essential hypertension, malignant [I10]   . Hypothyroidism [E03.9] 10/15/2015  . Pancytopenia ([place]) [Y78.295] 10/15/2015  . Hyponatremia [E87.1] 10/14/2015  . Thrombocytopenia ([place]) [D69.6] 06/16/2015  . Right renal artery stenosis ([place]) [I70.1] 05/09/2015  . Chronic diastolic heart failure ([place]) [I50.32] 04/03/2014  . Hypertensive urgency [I16.0] 04/02/2014  . Headache [R51] 04/02/2014  . CKD (chronic kidney disease) stage 3, GFR 30-59 ml/min (HCC) [N18.3] 04/02/2014  . Paroxysmal atrial fibrillation ([place]) [I48.0] 01/25/2014  . Hypokalemia [E87.6] 07/19/2013  . Anemia [D64.9] 07/19/2013  . Bilateral lower extremity edema [R60.0]  06/30/2013  . Hypertension with fluid overload [I10, E87.79] 06/30/2013  . Pedal edema [R60.0] 05/10/2013  . Varicose veins of lower extremities with other complications [A21.308] 65/78/4696  . ABNORMAL STRESS ELECTROCARDIOGRAM [R94.31] 01/23/2010  . Hyperlipidemia [E78.5] 12/18/2009  . Essential hypertension [I10] 12/18/2009  . RENAL INSUFFICIENCY, CHRONIC [N18.9] 12/18/2009  . Arthropathy [M12.9] 12/18/2009    Past Medical History Past Medical History:  Diagnosis Date  . Anemia   . Arthritis   . Asthma   . CKD (chronic kidney disease), stage III ([place])   . Hyperlipidemia   . Hypertension   . Hypothyroidism   . Nodule of right lung    Right upper lobe  . Renal insufficiency    Chronic  . Renal vascular disease   . Right renal artery stenosis ([place]) 05/09/2015  . Vertigo     Past Surgical History Past Surgical History:  Procedure Laterality Date  . IR GENERIC HISTORICAL  08/29/2016   IR US GUIDE VASC ACCESS RIGHT 08/29/2016 [facility]  . IR GENERIC HISTORICAL  08/29/2016   IR RENAL BILAT S&I MOD SED 08/29/2016 [facility]  . THYROIDECTOMY, PARTIAL Right 1981   middle lobe removed  1st, then right lobe removed 7-8 years later (approx. 34)    Family History Family History  Problem Relation Age of Onset  . Thyroid disease Mother   . Stroke Father   . Hypertension Brother   . Lung cancer Maternal Uncle   . Cancer Maternal Grandmother        Liver  . Lung cancer Maternal Uncle   . Hypertension Daughter   . Hypercholesterolemia Daughter   . Hypercholesterolemia Daughter      Social History  reports that she has never smoked. She has never used smokeless tobacco. She reports that she does not drink alcohol or use drugs.  Medications  Current Outpatient Medications:  .  atenolol-chlorthalidone (TENORETIC) 50-25 MG tablet, TAKE 1 TABLET BY MOUTH DAILY., Disp: 90 tablet, Rfl: 0 .  atorvastatin (LIPITOR) 20 MG tablet, TAKE 1/2 TABLET DAILY, Disp: 90 tablet, Rfl:  1 .  calcium-vitamin D (OSCAL WITH D) 500-200 MG-UNIT per tablet, Take 1 tablet by mouth at bedtime. , Disp: , Rfl:  .  cholecalciferol (VITAMIN D) 1000 units tablet, Take 1,000 Units by mouth at bedtime. , Disp: , Rfl:  .  flecainide (TAMBOCOR) 50 MG tablet, TAKE 1 TABLET (50 MG TOTAL) BY MOUTH 2 (TWO) TIMES DAILY., Disp: 180 tablet, Rfl: 60 .  fluticasone (FLONASE) 50 MCG/ACT nasal spray, Place 1 spray into both nostrils 2 (two) times daily as needed for allergies or rhinitis. , Disp: , Rfl:  .  hydrALAZINE (APRESOLINE) 50 MG tablet, Take 100 mg by mouth 2 (two) times daily. , Disp: , Rfl:  .  hydroxypropyl methylcellulose / hypromellose (ISOPTO TEARS / GONIOVISC) 2.5 % ophthalmic solution, Place 1 drop into both eyes daily. , Disp: , Rfl:  .  levothyroxine (SYNTHROID, LEVOTHROID) 75 MCG tablet, TAKE 1 TABLET (75 MCG TOTAL) BY MOUTH DAILY., Disp: 90 tablet, Rfl: 3 .  lisinopril (PRINIVIL,ZESTRIL) 40 MG tablet, TAKE 1 TABLET BY MOUTH TWICE A DAY, Disp: 60 tablet, Rfl: 4 .  Omega-3 Fatty Acids (FISH OIL) 1200 MG CAPS, Take 1,200 mg by mouth daily., Disp: , Rfl:   Allergies Amlodipine; Isosorbide nitrate; Other; Sulfonamide derivatives; and Terazosin  Review of Systems Review of Systems - Oncology ROS negative other than easy bruising.     Physical Exam  Vitals Wt Readings from Last 3 Encounters:  03/05/18 125 lb 1.6 oz (56.7 kg)  03/03/18 123 lb 14.4 oz (56.2 kg)  01/29/18 124 lb 1.6 oz (56.3 kg)   Temp Readings from Last 3 Encounters:  03/05/18 98.2 F (36.8 C) (Oral)  03/03/18 98.1 F (36.7 C) (Oral)  01/29/18 98.1 F (36.7 C) (Oral)   BP Readings from Last 3 Encounters:  03/05/18 (!) 157/60  03/03/18 (!) 205/66  01/29/18 (!) 167/52   Pulse Readings from Last 3 Encounters:  03/05/18 (!) 54  03/03/18 64  01/29/18 (!) 58   Constitutional: Well-developed, well-nourished, and in no distress.   HENT: Head: Normocephalic and atraumatic.  Mouth/Throat: No oropharyngeal  exudate. Mucosa moist. Eyes: Pupils are equal, round, and reactive to light. Conjunctivae are normal. No scleral icterus.  Neck: Normal range of motion. Neck supple. No JVD present.  Cardiovascular: Normal rate, regular rhythm and normal heart sounds.  Exam reveals no gallop and no friction rub.   No murmur heard. Pulmonary/Chest: Effort normal and breath sounds normal. No respiratory distress. No wheezes.No rales.  Abdominal: Soft. Bowel sounds are normal. No distension. There is no tenderness. There is no guarding.  Musculoskeletal: No edema or tenderness.  Lymphadenopathy: No cervical, axillary or supraclavicular adenopathy.  Neurological: Alert and oriented to person, place, and time. No cranial nerve deficit.  Skin: Skin is warm and dry. No rash noted. No erythema. No pallor.  Psychiatric: Affect and judgment normal.  Bilateral breast exam:  Chaperone present.  Bruising noted on right breast from recent biopsies.  Left breast shows no dominant masses.    Labs Appointment on 03/03/2018  Component Date Value Ref Range Status  . WBC Count 03/03/2018 2.7* 3.9 - 10.3 K/uL Final  . RBC 03/03/2018 3.70  3.70 - 5.45 MIL/uL Final  . Hemoglobin 03/03/2018 10.7* 11.6 - 15.9 g/dL Final  . HCT 03/03/2018 31.6* 34.8 - 46.6 % Final  . MCV 03/03/2018 85.4  79.5 - 101.0 fL Final  . MCH 03/03/2018 28.9  25.1 - 34.0 pg Final  . MCHC 03/03/2018 33.9  31.5 - 36.0 g/dL Final  . RDW 03/03/2018 15.1* 11.2 - 14.5 % Final  . Platelet Count 03/03/2018 136* 145 - 400 K/uL Final  . Neutrophils Relative % 03/03/2018 64  % Final  . Neutro Abs 03/03/2018 1.7  1.5 - 6.5 K/uL Final  . Lymphocytes Relative 03/03/2018 24  % Final  . Lymphs Abs 03/03/2018 0.6* 0.9 - 3.3 K/uL Final  . Monocytes Relative 03/03/2018 10  % Final  . Monocytes Absolute 03/03/2018 0.3  0.1 - 0.9 K/uL Final  . Eosinophils Relative 03/03/2018 1  % Final  . Eosinophils Absolute 03/03/2018 0.0  0.0 - 0.5 K/uL Final  . Basophils Relative  03/03/2018 1  % Final  . Basophils Absolute 03/03/2018 0.0  0.0 - 0.1 K/uL Final   Performed at [facility], [place] [address] [place], [place] [address]  . Sodium 03/03/2018 138  135 - 145 mmol/L Final   Please note reference intervals were recently updated.  . Potassium 03/03/2018 3.9  3.5 - 5.1 mmol/L Final  . Chloride 03/03/2018 99  98 - 111 mmol/L Final  . CO2 03/03/2018 32  22 - 32 mmol/L Final  . Glucose, Bld 03/03/2018 132* 70 - 99 mg/dL Final  . BUN 03/03/2018 27* 8 - 23 mg/dL Final   Please note change in reference range.  . Creatinine 03/03/2018 1.57* 0.44 - 1.00 mg/dL Final  . Calcium 03/03/2018 9.4  8.9 - 10.3 mg/dL Final  . Total Protein 03/03/2018 7.4  6.5 - 8.1 g/dL Final  . Albumin 03/03/2018 4.0  3.5 - 5.0 g/dL Final  . AST 03/03/2018 19  15 - 41 U/L Final  . ALT 03/03/2018 14  0 - 44 U/L Final  . Alkaline Phosphatase 03/03/2018 72  38 - 126 U/L Final  . Total Bilirubin 03/03/2018 0.5  0.3 - 1.2 mg/dL Final  . GFR, Est Non Af Am 03/03/2018 30* >60 mL/min Final  . GFR, Est AFR Am 03/03/2018 34* >60 mL/min Final   Comment: (NOTE) The eGFR has been calculated using the CKD EPI equation. This calculation has not been validated in all clinical situations. eGFR's persistently <60 mL/min signify possible Chronic Kidney Disease.   Georgiann Hahn gap 03/03/2018 7  5 - 15 Final   Performed at [facility], [place] [address] [place],  [address]     Pathology Orders Placed This Encounter  Procedures  . CBC with Differential/Platelet    Standing Status:   Future    Standing Expiration Date:   03/06/2019  . Comprehensive metabolic panel    Standing Status:   Future  Standing Expiration Date:   03/06/2019  . Lactate dehydrogenase    Standing Status:   Future    Standing Expiration Date:   03/06/2019       Zoila Shutter MD

## 2018-03-09 ENCOUNTER — Telehealth: Payer: Self-pay | Admitting: Adult Health

## 2018-03-09 NOTE — Telephone Encounter (Signed)
° ° °  ° °  [facility] Pre-operative Risk Assessment    Request for surgical clearance:  1. What type of surgery is being performed? RIGHT BREAST SEED LUMPECTOMY  2. When is this surgery scheduled? TBD- ASAP  3. What type of clearance is required (medical clearance vs. Pharmacy clearance to hold med vs. Both)? BOTH  4. Are there any medications that need to be held prior to surgery and how long?  5. Practice name and name of physician performing surgery? DR MATTHEW WAKEFIELD, [facility] [place]  6. What is your office phone number [phone]   7.   What is your office fax [phone]  8.   Anesthesia type (None, local, MAC, general) ? [place] 03/09/2018, 8:57 AM  _________________________________________________________________   (provider comments below)

## 2018-03-10 ENCOUNTER — Encounter: Payer: Self-pay | Admitting: Adult Health

## 2018-03-10 ENCOUNTER — Ambulatory Visit: Payer: PPO | Admitting: Adult Health

## 2018-03-10 VITALS — BP 170/72 | HR 56 | Ht 65.25 in | Wt 124.0 lb

## 2018-03-10 DIAGNOSIS — I10 Essential (primary) hypertension: Secondary | ICD-10-CM | POA: Diagnosis not present

## 2018-03-10 DIAGNOSIS — I340 Nonrheumatic mitral (valve) insufficiency: Secondary | ICD-10-CM | POA: Diagnosis not present

## 2018-03-10 DIAGNOSIS — Z01810 Encounter for preprocedural cardiovascular examination: Secondary | ICD-10-CM | POA: Diagnosis not present

## 2018-03-10 DIAGNOSIS — I350 Nonrheumatic aortic (valve) stenosis: Secondary | ICD-10-CM | POA: Diagnosis not present

## 2018-03-10 DIAGNOSIS — E7800 Pure hypercholesterolemia, unspecified: Secondary | ICD-10-CM | POA: Diagnosis not present

## 2018-03-10 NOTE — Patient Instructions (Signed)
Medication Instructions:  NO CHANGES- Your physician recommends that you continue on your current medications as directed. Please refer to the Current Medication list given to you today.  If you need a refill on your cardiac medications before your next appointment, please call your pharmacy.  Testing/Procedures: Echocardiogram - Your physician has requested that you have an echocardiogram. Echocardiography is a painless test that uses sound waves to create images of your heart. It provides your doctor with information about the size and shape of your heart and how well your heart's chambers and valves are working. This procedure takes approximately one hour. There are no restrictions for this procedure. This will be performed at our [facility] location - [address], [address].  Special Instructions: CLEARED FOR LUMPECTOMY  Follow-Up: Your physician wants you to follow-up in: [place].   Thank you for choosing [facility] at [facility]!!

## 2018-03-10 NOTE — Progress Notes (Signed)
Cardiology Office Note   Date:  03/10/2018   ID:  [name], DOB May 06, 1934, MRN [id number]  PCP:  Chipper Herb, MD  Cardiologist: Hochrein   No chief complaint on file.    History of Present Illness: [name] is a 84 y.o. female who presents for ongoing assessment and management of chronic diastolic CHF, left atrial tachycardia, on flecainide, seen by Dr. Caryl Comes. Other history includes thrombocytopenia, and leukopenia. When last seen in the office by Hochrein, discussion of cardiac medications for etiology of leukopenia. He was to discuss with Dr. Caryl Comes if flecainide was contributing. She was to follow up with hematology for ongoing treatment and recommendations   She is also here for pre-operative cardiac evaluation for right breast seed lumpectomy with Dr. Donne Hazel. Date is to be determined. She has an appointment with him on March 22, 2018 to schedule the procedure.   She denies chest pain, but has some worsening DOE, and states that her energy is lower than usual. She thinks its related to her age and arthritis of her knees which slows her down and does not allow her to be more active.  She stopped taking ASA on her own. She complains of excessive bruising.   Past Medical History:  Diagnosis Date  . Anemia   . Arthritis   . Asthma   . CKD (chronic kidney disease), stage III ([place])   . Hyperlipidemia   . Hypertension   . Hypothyroidism   . Nodule of right lung    Right upper lobe  . Renal insufficiency    Chronic  . Renal vascular disease   . Right renal artery stenosis ([place]) 05/09/2015  . Vertigo     Past Surgical History:  Procedure Laterality Date  . IR GENERIC HISTORICAL  08/29/2016   IR US GUIDE VASC ACCESS RIGHT 08/29/2016 [facility]  . IR GENERIC HISTORICAL  08/29/2016   IR RENAL BILAT S&I MOD SED 08/29/2016 [facility]  . THYROIDECTOMY, PARTIAL Right 1981   middle lobe removed 1st, then right lobe removed 7-8 years later (approx. 1988)     Current  Outpatient Medications  Medication Sig Dispense Refill  . atenolol-chlorthalidone (TENORETIC) 50-25 MG tablet TAKE 1 TABLET BY MOUTH DAILY. 90 tablet 0  . atorvastatin (LIPITOR) 20 MG tablet TAKE 1/2 TABLET DAILY 90 tablet 1  . calcium-vitamin D (OSCAL WITH D) 500-200 MG-UNIT per tablet Take 1 tablet by mouth at bedtime.     . cholecalciferol (VITAMIN D) 1000 units tablet Take 1,000 Units by mouth at bedtime.     . flecainide (TAMBOCOR) 50 MG tablet TAKE 1 TABLET (50 MG TOTAL) BY MOUTH 2 (TWO) TIMES DAILY. 180 tablet 60  . fluticasone (FLONASE) 50 MCG/ACT nasal spray Place 1 spray into both nostrils 2 (two) times daily as needed for allergies or rhinitis.     . hydrALAZINE (APRESOLINE) 50 MG tablet Take 100 mg by mouth 2 (two) times daily.     . hydroxypropyl methylcellulose / hypromellose (ISOPTO TEARS / GONIOVISC) 2.5 % ophthalmic solution Place 1 drop into both eyes daily.     Marland Kitchen levothyroxine (SYNTHROID, LEVOTHROID) 75 MCG tablet TAKE 1 TABLET (75 MCG TOTAL) BY MOUTH DAILY. 90 tablet 3  . lisinopril (PRINIVIL,ZESTRIL) 40 MG tablet TAKE 1 TABLET BY MOUTH TWICE A DAY 60 tablet 4  . Omega-3 Fatty Acids (FISH OIL) 1200 MG CAPS Take 1,200 mg by mouth daily.     No current facility-administered medications for this visit.  Allergies:   Amlodipine; Isosorbide nitrate; Other; Sulfonamide derivatives; and Terazosin    Social History:  The patient  reports that she has never smoked. She has never used smokeless tobacco. She reports that she does not drink alcohol or use drugs.   Family History:  The patient's family history includes Cancer in her maternal grandmother; Hypercholesterolemia in her daughter and daughter; Hypertension in her brother and daughter; Lung cancer in her maternal uncle and maternal uncle; Stroke in her father; Thyroid disease in her mother.    ROS: All other systems are reviewed and negative. Unless otherwise mentioned in H&P    PHYSICAL EXAM: VS:  There were no  vitals taken for this visit. , BMI There is no height or weight on file to calculate BMI. GEN: Well nourished, well developed, in no acute distress  HEENT: normal  Neck: no JVD, carotid bruits, or masses Cardiac: RRR; 2/6 holosystolic murmur heard best at both the RSB and Apex with radiation into the carotids bilaterally and axilla, rubs, or gallops,no edema  Respiratory:  clear to auscultation bilaterally, normal work of breathing GI: soft, nontender, nondistended, + BS MS: no deformity or atrophy  Skin: warm and dry, no rash Neuro:  Strength and sensation are intact Psych: euthymic mood, full affect   EKG:  Sinus bradycardia rate of 56 bpm.   Recent Labs: 03/03/2018: ALT 14; BUN 27; Creatinine 1.57; Hemoglobin 10.7; Platelet Count 136; Potassium 3.9; Sodium 138    Lipid Panel    Component Value Date/Time   CHOL 129 01/12/2018 1405   CHOL 159 01/13/2013 1042   TRIG 95 01/12/2018 1405   TRIG 84 03/10/2016 0928   TRIG 142 01/13/2013 1042   HDL 49 01/12/2018 1405   HDL 56 03/10/2016 0928   HDL 54 01/13/2013 1042   CHOLHDL 2.6 01/12/2018 1405   CHOLHDL 4.6 06/05/2007 0125   VLDL 54 (H) 06/05/2007 0125   LDLCALC 61 01/12/2018 1405   LDLCALC 76 03/14/2014 0822   LDLCALC 77 01/13/2013 1042      Wt Readings from Last 3 Encounters:  03/05/18 125 lb 1.6 oz (56.7 kg)  03/03/18 123 lb 14.4 oz (56.2 kg)  01/29/18 124 lb 1.6 oz (56.3 kg)      Other studies Reviewed: Echocardiogram 2014/04/28 Left ventricle: The cavity size was normal. There was moderate concentric hypertrophy. Systolic function was vigorous. The estimated ejection fraction was in the range of 65% to 70%. Wall motion was normal; there were no regional wall motion abnormalities. Doppler parameters are consistent with abnormal left ventricular relaxation (grade 1 diastolic dysfunction). The E/e&' ratio is between 8-15, suggesting indeterminate LV Filling pressure. - Aortic valve: Structurally  normal valve. Trileaflet. Transvalvular velocity was minimally increased, most of which is due to intracavitary gradient. There was no stenosis. There was no regurgitation. - Left atrium: The atrium was at the upper limits of normal in size. - Tricuspid valve: TR jet not adequate to measure RVSP.  ASSESSMENT AND PLAN:  1.  Hypertension: BP is elevated today. She is nervous about being here. Recheck has it 147/88. Will continue current regimen for now. If remains elevated will adjust medications.   2. Pre-operative cardiac evaluation:  Chart reviewed as part of pre-operative protocol coverage. Given past medical history and time since last visit, based on ACC/AHA guidelines, [name] would be at acceptable risk for the planned procedure without further cardiovascular testing.   3. Aortic and Mitral Murmur: Repeat echo to assist in medical management. Doubt she  is a candidate for repair. She wishes to have this completed at [facility] as this is closer to her home.   Current medicines are reviewed at length with the patient today.    Labs/ tests ordered today include: Echocardiogram   Phill Myron. [place] Pugh, ANP, AACC   03/10/2018 7:37 AM    [facility] [address], [place], [place] [address] Phone: [phone]; Fax: [phone]

## 2018-03-11 ENCOUNTER — Telehealth: Payer: Self-pay | Admitting: *Deleted

## 2018-03-11 NOTE — Telephone Encounter (Signed)
I will route [name]'s progress note from yesterday providing clearance to the requesting physician.

## 2018-03-11 NOTE — Telephone Encounter (Signed)
Spoke to pt concerning [place]a from 7.17.19. Denies questions or concerns regarding dx or treatment care plan. Encourage pt to call with needs. Received verbal understanding.

## 2018-03-13 ENCOUNTER — Other Ambulatory Visit: Payer: Self-pay | Admitting: Family Medicine

## 2018-03-13 DIAGNOSIS — I10 Essential (primary) hypertension: Secondary | ICD-10-CM

## 2018-03-15 ENCOUNTER — Encounter: Payer: Self-pay | Admitting: General Practice

## 2018-03-15 NOTE — Progress Notes (Signed)
[facility] Psychosocial Distress Screening Spiritual Care   [name] was seen in [facility], completing distress screen per protocol.  The patient scored a 1 on the Psychosocial Distress Thermometer which indicates mild distress.   ONCBCN DISTRESS SCREENING 03/15/2018  Screening Type Initial Screening  Distress experienced in past week (1-10) 1  Family Problem type   Information Concerns Type Lack of info about diagnosis;Lack of info about treatment;Lack of info about complementary therapy choices  Referral to support programs Yes   Follow up needed: No. Per pt, upon leaving [facility], her distress was down to 0. She has full packet of [place], but please also page if needs arise or circumstances change. Thank you.   [place], [place], [facility] Pager [phone] Voicemail [phone]

## 2018-03-16 ENCOUNTER — Ambulatory Visit (HOSPITAL_COMMUNITY)
Admission: RE | Admit: 2018-03-16 | Discharge: 2018-03-16 | Disposition: A | Discharge status: Home / Self Care | Payer: PPO | Source: Ambulatory Visit | Attending: Adult Health | Admitting: Adult Health

## 2018-03-16 DIAGNOSIS — I5032 Chronic diastolic (congestive) heart failure: Secondary | ICD-10-CM | POA: Insufficient documentation

## 2018-03-16 DIAGNOSIS — E785 Hyperlipidemia, unspecified: Secondary | ICD-10-CM | POA: Diagnosis not present

## 2018-03-16 DIAGNOSIS — I340 Nonrheumatic mitral (valve) insufficiency: Secondary | ICD-10-CM | POA: Diagnosis not present

## 2018-03-16 DIAGNOSIS — I350 Nonrheumatic aortic (valve) stenosis: Secondary | ICD-10-CM

## 2018-03-16 DIAGNOSIS — I110 Hypertensive heart disease with heart failure: Secondary | ICD-10-CM | POA: Insufficient documentation

## 2018-03-16 DIAGNOSIS — I083 Combined rheumatic disorders of mitral, aortic and tricuspid valves: Secondary | ICD-10-CM | POA: Insufficient documentation

## 2018-03-16 DIAGNOSIS — I4891 Unspecified atrial fibrillation: Secondary | ICD-10-CM | POA: Insufficient documentation

## 2018-03-16 NOTE — Progress Notes (Signed)
*  PRELIMINARY RESULTS* Echocardiogram 2D Echocardiogram has been performed.  [name] 03/16/2018, 11:19 AM

## 2018-03-22 ENCOUNTER — Other Ambulatory Visit: Payer: Self-pay | Admitting: General Surgery

## 2018-03-22 DIAGNOSIS — C50211 Malignant neoplasm of upper-inner quadrant of right female breast: Secondary | ICD-10-CM | POA: Diagnosis not present

## 2018-03-22 DIAGNOSIS — C50411 Malignant neoplasm of upper-outer quadrant of right female breast: Secondary | ICD-10-CM

## 2018-03-22 DIAGNOSIS — Z170 Estrogen receptor positive status [ER+]: Principal | ICD-10-CM

## 2018-03-23 ENCOUNTER — Other Ambulatory Visit: Payer: Self-pay | Admitting: *Deleted

## 2018-03-23 DIAGNOSIS — Z170 Estrogen receptor positive status [ER+]: Principal | ICD-10-CM

## 2018-03-23 DIAGNOSIS — C50511 Malignant neoplasm of lower-outer quadrant of right female breast: Secondary | ICD-10-CM

## 2018-03-24 NOTE — Progress Notes (Signed)
Cat 5

## 2018-04-01 NOTE — Pre-Procedure Instructions (Addendum)
[name]  04/01/2018      [id number] - [place], [place] [address] [place] [address] Phone: [phone] Fax: [phone]    Your procedure is scheduled on Thursday August 22.  Report to [facility] Admitting at 5:30 A.M.  Call this number if you have problems the morning of surgery:  [phone]   Remember:  Do not eat after midnight.  You may drink clear liquids until 3 hours prior to surgery time (04:30 AM). Clear liquids include water, BLACK COFFEE (no cream or sugar), sprite. No milk products.   Drink ensure pre-surgery drink by 04:30 AM (3 hours prior to surgery)    Take these medicines the morning of surgery with A SIP OF WATER:   Flecainide (Tambocor) Hydralazine (apresoline) Levothyroxine (synthroid) Tenoretic (Atenolol-chlorthalidone) Flonase Acetaminophen (tylenol) if needed Eye drops  7 days prior to surgery STOP taking any Aspirin(unless otherwise instructed by your surgeon), Aleve, Naproxen, Ibuprofen, Motrin, Advil, Goody's, BC's, all herbal medications, fish oil, and all vitamins     Do not wear jewelry, make-up or nail polish.  Do not wear lotions, powders, or perfumes, or deodorant.  Do not shave 48 hours prior to surgery.  Men may shave face and neck.  Do not bring valuables to the hospital.  [facility] is not responsible for any belongings or valuables.  Contacts, dentures or bridgework may not be worn into surgery.  Leave your suitcase in the car.  After surgery it may be brought to your room.  For patients admitted to the hospital, discharge time will be determined by your treatment team.  Patients discharged the day of surgery will not be allowed to drive home.   Special instructions:    Interior- Preparing For Surgery  Before surgery, you can play an important role. Because skin is not sterile, your skin needs to be as free of germs as possible. You can reduce the number of  germs on your skin by washing with CHG (chlorahexidine gluconate) Soap before surgery.  CHG is an antiseptic cleaner which kills germs and bonds with the skin to continue killing germs even after washing.    Oral Hygiene is also important to reduce your risk of infection.  Remember - BRUSH YOUR TEETH THE MORNING OF SURGERY WITH YOUR REGULAR TOOTHPASTE  Please do not use if you have an allergy to CHG or antibacterial soaps. If your skin becomes reddened/irritated stop using the CHG.  Do not shave (including legs and underarms) for at least 48 hours prior to first CHG shower. It is OK to shave your face.  Please follow these instructions carefully.   1. Shower the NIGHT BEFORE SURGERY and the MORNING OF SURGERY with CHG.   2. If you chose to wash your hair, wash your hair first as usual with your normal shampoo.  3. After you shampoo, rinse your hair and body thoroughly to remove the shampoo.  4. Use CHG as you would any other liquid soap. You can apply CHG directly to the skin and wash gently with a scrungie or a clean washcloth.   5. Apply the CHG Soap to your body ONLY FROM THE NECK DOWN.  Do not use on open wounds or open sores. Avoid contact with your eyes, ears, mouth and genitals (private parts). Wash Face and genitals (private parts)  with your normal soap.  6. Wash thoroughly, paying special attention to the area where your surgery will be  performed.  7. Thoroughly rinse your body with warm water from the neck down.  8. DO NOT shower/wash with your normal soap after using and rinsing off the CHG Soap.  9. Pat yourself dry with a CLEAN TOWEL.  10. Wear CLEAN PAJAMAS to bed the night before surgery, wear comfortable clothes the morning of surgery  11. Place CLEAN SHEETS on your bed the night of your first shower and DO NOT SLEEP WITH PETS.    Day of Surgery:  Do not apply any deodorants/lotions.  Please wear clean clothes to the hospital/surgery center.   Remember to brush  your teeth WITH YOUR REGULAR TOOTHPASTE.    Please read over the following fact sheets that you were given. Coughing and Deep Breathing and Surgical Site Infection Prevention

## 2018-04-02 ENCOUNTER — Other Ambulatory Visit: Payer: Self-pay

## 2018-04-02 ENCOUNTER — Encounter (HOSPITAL_COMMUNITY)
Admission: RE | Admit: 2018-04-02 | Discharge: 2018-04-02 | Disposition: A | Discharge status: Home / Self Care | Payer: PPO | Source: Ambulatory Visit | Attending: General Surgery | Admitting: General Surgery

## 2018-04-02 ENCOUNTER — Encounter (HOSPITAL_COMMUNITY): Payer: Self-pay

## 2018-04-02 ENCOUNTER — Telehealth: Payer: Self-pay | Admitting: Hematology

## 2018-04-02 DIAGNOSIS — Z01812 Encounter for preprocedural laboratory examination: Secondary | ICD-10-CM | POA: Diagnosis not present

## 2018-04-02 HISTORY — DX: Cardiac murmur, unspecified: R01.1

## 2018-04-02 HISTORY — DX: Unspecified atrial fibrillation: I48.91

## 2018-04-02 HISTORY — DX: Nausea with vomiting, unspecified: R11.2

## 2018-04-02 HISTORY — DX: Other specified postprocedural states: Z98.890

## 2018-04-02 LAB — BASIC METABOLIC PANEL
ANION GAP: 10 (ref 5–15)
BUN: 22 mg/dL (ref 8–23)
CALCIUM: 9.2 mg/dL (ref 8.9–10.3)
CO2: 27 mmol/L (ref 22–32)
Chloride: 99 mmol/L (ref 98–111)
Creatinine, Ser: 1.38 mg/dL — ABNORMAL HIGH (ref 0.44–1.00)
GFR, EST AFRICAN AMERICAN: 40 mL/min — AB (ref >60)
GFR, EST NON AFRICAN AMERICAN: 35 mL/min — AB (ref >60)
Glucose, Bld: 95 mg/dL (ref 70–99)
POTASSIUM: 3.8 mmol/L (ref 3.5–5.1)
Sodium: 136 mmol/L (ref 135–145)

## 2018-04-02 LAB — CBC
HCT: 33.6 % — ABNORMAL LOW (ref 36.0–46.0)
Hemoglobin: 10.9 g/dL — ABNORMAL LOW (ref 12.0–15.0)
MCH: 29.1 pg (ref 26.0–34.0)
MCHC: 32.4 g/dL (ref 30.0–36.0)
MCV: 89.6 fL (ref 78.0–100.0)
PLATELETS: 171 10*3/uL (ref 150–400)
RBC: 3.75 MIL/uL — AB (ref 3.87–5.11)
RDW: 14.1 % (ref 11.5–15.5)
WBC: 2.5 10*3/uL — AB (ref 4.0–10.5)

## 2018-04-02 NOTE — Telephone Encounter (Signed)
Appts scheduled and I spoke with patient per 8/16 sch msg

## 2018-04-02 NOTE — Progress Notes (Signed)
PCP - [facility] Cardiologist - H[place]- Saw Jory Sims for pre-op clearance  Chest x-ray - 01/18/18 EKG - 03/10/18 Stress Test - 02/23/14 ECHO - 03/16/18 Cardiac Cath - 01/26/10  Anesthesia review: cardiac hx, ECHO reports  PT in 170s at pre-op, pt states she will check her BP when she gets home and it will be lower. Pt states it is often high at MD appointments. Pt denies any symptoms.   Patient denies shortness of breath, fever, cough and chest pain at PAT appointment   Patient verbalized understanding of instructions that were given to them at the PAT appointment. Patient was also instructed that they will need to review over the PAT instructions again at home before surgery.

## 2018-04-05 NOTE — Progress Notes (Signed)
Anesthesia Chart Review:  Case:  [id number] Date/Time:  04/08/18 0715   Procedure:  BREAST LUMPECTOMY WITH RADIOACTIVE SEED LOCALIZATION (Right Breast)   Anesthesia type:  General   Pre-op diagnosis:  RIGHT BREAST CANCER   Location:  [place] OR ROOM 01 / [place] OR   Surgeon:  Rolm Bookbinder, MD      DISCUSSION: 84 yo female never smoker for above procedure. Pertinent hx includes PONV, HLD, Hypothyroid, Anemia, Vertigo, Heart murmur, CKD III, Right renal artery stenosis, HTN, Asthma, Diastolic CHF, Left atrial tachycardia, Thrombocytopenia, Leukopenia.  Pt was seen by cardiology Jory Sims, NP 03/10/2018 for preop clearance. Per her note "Chart reviewed as part of pre-operative protocol coverage. Given past medical history and time since last visit, based on ACC/AHA guidelines, [name] would be at acceptable risk for the planned procedure without further cardiovascular testing."  Anticipate she can proceed as planned barring acute status change.  VS: BP (!) 178/60 Comment: taken manually in Left arm/notified Meredith RN  Pulse (!) 55   Temp 36.6 C   Resp 18   Ht 5\' 6"  (1.676 m)   Wt 56.3 kg   SpO2 96%   BMI 20.05 kg/m   PROVIDERS: Chipper Herb, MD is PCP  Minus Breeding, MD is Cardiologist pt last seen in office by Jory Sims 03/10/2018.   LABS: Labs reviewed: Acceptable for surgery. (all labs ordered are listed, but only abnormal results are displayed)  Labs Reviewed  CBC - Abnormal; Notable for the following components:      Result Value   WBC 2.5 (*)    RBC 3.75 (*)    Hemoglobin 10.9 (*)    HCT 33.6 (*)    All other components within normal limits  BASIC METABOLIC PANEL - Abnormal; Notable for the following components:   Creatinine, Ser 1.38 (*)    GFR calc non Af Amer 35 (*)    GFR calc Af Amer 40 (*)    All other components within normal limits     IMAGES: CHEST - 2 VIEW 01/18/2018  COMPARISON:  November 28, 2015  FINDINGS: There is no edema or  consolidation. Heart is upper normal in size with pulmonary vascularity normal. There is aortic atherosclerosis. No adenopathy. No bone lesions.  IMPRESSION: No edema or consolidation.  There is aortic atherosclerosis.  Aortic Atherosclerosis (ICD10-I70.0).   EKG: 03/10/2018: Sinus brady (56bpm)  CV: Echo 03/16/2018: Study Conclusions  - Left ventricle: The cavity size was normal. Wall thickness was   increased in a pattern of moderate LVH. Systolic function was   normal. The estimated ejection fraction was in the range of 60%   to 65%. Wall motion was normal; there were no regional wall   motion abnormalities. Features are consistent with a pseudonormal   left ventricular filling pattern, with concomitant abnormal   relaxation and increased filling pressure (grade 2 diastolic   dysfunction). Doppler parameters are consistent with high   ventricular filling pressure. - Aortic valve: Moderately calcified annulus. Trileaflet;   moderately thickened leaflets. There was mild stenosis. Valve   area (VTI): 1.53 cm^2. Valve area (Vmax): 1.47 cm^2. Valve area   (Vmean): 1.51 cm^2. - Mitral valve: There was mild regurgitation. - Left atrium: The atrium was severely dilated. - Right atrium: The atrium was moderately dilated. - Atrial septum: No defect or patent foramen ovale was identified. - Pulmonary arteries: Systolic pressure was mildly increased. PA   peak pressure: 34 mm Hg (S). - Technically adequate study.  Carotid US 07/06/2017: RIGHT CAROTID ARTERY: No significant calcifications of the right common carotid artery. Intermediate waveform maintained. Heterogeneous and partially calcified plaque at the right carotid bifurcation. No significant lumen shadowing. Low resistance waveform of the right ICA. No significant tortuosity.  RIGHT VERTEBRAL ARTERY: Antegrade flow with low resistance waveform.  LEFT CAROTID ARTERY: No significant calcifications of the left common  carotid artery. Intermediate waveform maintained. Heterogeneous and partially calcified plaque at the left carotid bifurcation without significant lumen shadowing. Low resistance waveform of the left ICA. No significant tortuosity.  LEFT VERTEBRAL ARTERY:  Antegrade flow with low resistance waveform.  IMPRESSION: Color duplex indicates moderate heterogeneous and calcified plaque, with no hemodynamically significant stenosis by duplex criteria in the extracranial cerebrovascular circulation.  Past Medical History:  Diagnosis Date  . Anemia   . Arthritis   . Asthma   . Atrial fibrillation ([place])   . CKD (chronic kidney disease), stage III ([place])   . Heart murmur   . Hyperlipidemia   . Hypertension   . Hypothyroidism   . Nodule of right lung    Right upper lobe  . PONV (postoperative nausea and vomiting)   . Renal insufficiency    Chronic  . Renal vascular disease   . Right renal artery stenosis ([place]) 05/09/2015  . Vertigo     Past Surgical History:  Procedure Laterality Date  . IR GENERIC HISTORICAL  08/29/2016   IR US GUIDE VASC ACCESS RIGHT 08/29/2016 [facility]  . IR GENERIC HISTORICAL  08/29/2016   IR RENAL BILAT S&I MOD SED 08/29/2016 [facility]  . KNEE ARTHROSCOPY     2019  . THYROIDECTOMY, PARTIAL Right 1981   middle lobe removed 1st, then right lobe removed 7-8 years later (approx. 1988)    MEDICATIONS: . ferrous sulfate 325 (65 FE) MG tablet  . acetaminophen (TYLENOL) 325 MG tablet  . atenolol-chlorthalidone (TENORETIC) 50-25 MG tablet  . atorvastatin (LIPITOR) 20 MG tablet  . calcium-vitamin D (OSCAL WITH D) 500-200 MG-UNIT per tablet  . cholecalciferol (VITAMIN D) 1000 units tablet  . flecainide (TAMBOCOR) 50 MG tablet  . fluticasone (FLONASE) 50 MCG/ACT nasal spray  . hydrALAZINE (APRESOLINE) 100 MG tablet  . hydroxypropyl methylcellulose / hypromellose (ISOPTO TEARS / GONIOVISC) 2.5 % ophthalmic solution  . levothyroxine (SYNTHROID, LEVOTHROID) 75  MCG tablet  . lisinopril (PRINIVIL,ZESTRIL) 40 MG tablet   No current facility-administered medications for this encounter.      [name] [facility] Short Stay Center/Anesthesiology Phone [phone] 04/05/2018 11:10 AM

## 2018-04-06 DIAGNOSIS — C50511 Malignant neoplasm of lower-outer quadrant of right female breast: Secondary | ICD-10-CM | POA: Diagnosis not present

## 2018-04-07 NOTE — Anesthesia Preprocedure Evaluation (Addendum)
Anesthesia Evaluation  Patient identified by MRN, date of birth, ID band Patient awake    Reviewed: Allergy & Precautions, NPO status , Patient's Chart, lab work & pertinent test results, reviewed documented beta blocker date and time   History of Anesthesia Complications (+) PONV and history of anesthetic complications  Airway Mallampati: II  TM Distance: >3 FB Neck ROM: Full    Dental  (+) Partial Upper, Partial Lower   Pulmonary    Pulmonary exam normal breath sounds clear to auscultation       Cardiovascular hypertension, Pt. on medications Normal cardiovascular exam+ dysrhythmias Atrial Fibrillation  Rhythm:Regular Rate:Normal  ECG: SB, rate 56  ECHO: LV EF: 60% - 65%   Neuro/Psych  Headaches, Vertigo negative psych ROS   GI/Hepatic negative GI ROS, Neg liver ROS,   Endo/Other  Hypothyroidism   Renal/GU Renal disease     Musculoskeletal negative musculoskeletal ROS (+)   Abdominal   Peds  Hematology  (+) anemia , HLD   Anesthesia Other Findings RIGHT BREAST CANCER  Reproductive/Obstetrics                          Anesthesia Physical Anesthesia Plan  ASA: III  Anesthesia Plan: General   Post-op Pain Management:    Induction: Intravenous  PONV Risk Score and Plan: 4 or greater and Dexamethasone, Ondansetron, Treatment may vary due to age or medical condition and Propofol infusion  Airway Management Planned: LMA  Additional Equipment:   Intra-op Plan:   Post-operative Plan: Extubation in OR  Informed Consent: I have reviewed the patients History and Physical, chart, labs and discussed the procedure including the risks, benefits and alternatives for the proposed anesthesia with the patient or authorized representative who has indicated his/her understanding and acceptance.   Dental advisory given  Plan Discussed with: CRNA  Anesthesia Plan Comments:         Anesthesia Quick Evaluation

## 2018-04-08 ENCOUNTER — Ambulatory Visit (HOSPITAL_COMMUNITY): Payer: PPO | Admitting: Physician Assistant

## 2018-04-08 ENCOUNTER — Encounter (HOSPITAL_COMMUNITY)
Admission: RE | Disposition: A | Discharge status: Home / Self Care | Payer: Self-pay | Source: Ambulatory Visit | Attending: General Surgery

## 2018-04-08 ENCOUNTER — Observation Stay (HOSPITAL_COMMUNITY)
Admission: RE | Admit: 2018-04-08 | Discharge: 2018-04-09 | Disposition: A | Discharge status: Home / Self Care | Payer: PPO | Source: Ambulatory Visit | Attending: General Surgery | Admitting: General Surgery

## 2018-04-08 ENCOUNTER — Encounter (HOSPITAL_COMMUNITY): Payer: Self-pay | Admitting: *Deleted

## 2018-04-08 ENCOUNTER — Ambulatory Visit (HOSPITAL_COMMUNITY): Payer: PPO | Admitting: Anesthesiology

## 2018-04-08 DIAGNOSIS — I10 Essential (primary) hypertension: Secondary | ICD-10-CM | POA: Diagnosis not present

## 2018-04-08 DIAGNOSIS — N183 Chronic kidney disease, stage 3 (moderate): Secondary | ICD-10-CM | POA: Diagnosis not present

## 2018-04-08 DIAGNOSIS — Z803 Family history of malignant neoplasm of breast: Secondary | ICD-10-CM | POA: Diagnosis not present

## 2018-04-08 DIAGNOSIS — I4891 Unspecified atrial fibrillation: Secondary | ICD-10-CM | POA: Diagnosis not present

## 2018-04-08 DIAGNOSIS — N6081 Other benign mammary dysplasias of right breast: Secondary | ICD-10-CM | POA: Insufficient documentation

## 2018-04-08 DIAGNOSIS — C50211 Malignant neoplasm of upper-inner quadrant of right female breast: Secondary | ICD-10-CM | POA: Diagnosis not present

## 2018-04-08 DIAGNOSIS — N6011 Diffuse cystic mastopathy of right breast: Secondary | ICD-10-CM | POA: Diagnosis not present

## 2018-04-08 DIAGNOSIS — I130 Hypertensive heart and chronic kidney disease with heart failure and stage 1 through stage 4 chronic kidney disease, or unspecified chronic kidney disease: Secondary | ICD-10-CM | POA: Diagnosis not present

## 2018-04-08 DIAGNOSIS — Z888 Allergy status to other drugs, medicaments and biological substances status: Secondary | ICD-10-CM | POA: Diagnosis not present

## 2018-04-08 DIAGNOSIS — E785 Hyperlipidemia, unspecified: Secondary | ICD-10-CM | POA: Insufficient documentation

## 2018-04-08 DIAGNOSIS — C50911 Malignant neoplasm of unspecified site of right female breast: Secondary | ICD-10-CM | POA: Diagnosis not present

## 2018-04-08 DIAGNOSIS — Z79899 Other long term (current) drug therapy: Secondary | ICD-10-CM | POA: Diagnosis not present

## 2018-04-08 DIAGNOSIS — M1990 Unspecified osteoarthritis, unspecified site: Secondary | ICD-10-CM | POA: Insufficient documentation

## 2018-04-08 DIAGNOSIS — Z170 Estrogen receptor positive status [ER+]: Secondary | ICD-10-CM | POA: Insufficient documentation

## 2018-04-08 DIAGNOSIS — C50511 Malignant neoplasm of lower-outer quadrant of right female breast: Secondary | ICD-10-CM | POA: Diagnosis not present

## 2018-04-08 DIAGNOSIS — Z882 Allergy status to sulfonamides status: Secondary | ICD-10-CM | POA: Insufficient documentation

## 2018-04-08 DIAGNOSIS — I129 Hypertensive chronic kidney disease with stage 1 through stage 4 chronic kidney disease, or unspecified chronic kidney disease: Secondary | ICD-10-CM | POA: Diagnosis not present

## 2018-04-08 DIAGNOSIS — E039 Hypothyroidism, unspecified: Secondary | ICD-10-CM | POA: Insufficient documentation

## 2018-04-08 DIAGNOSIS — I5032 Chronic diastolic (congestive) heart failure: Secondary | ICD-10-CM | POA: Diagnosis not present

## 2018-04-08 HISTORY — PX: BREAST LUMPECTOMY WITH RADIOACTIVE SEED LOCALIZATION: SHX6424

## 2018-04-08 HISTORY — DX: Malignant neoplasm of unspecified site of right female breast: C50.911

## 2018-04-08 SURGERY — BREAST LUMPECTOMY WITH RADIOACTIVE SEED LOCALIZATION
Anesthesia: General | Site: Breast | Laterality: Right

## 2018-04-08 MED ORDER — GLYCOPYRROLATE PF 0.2 MG/ML IJ SOSY
PREFILLED_SYRINGE | INTRAMUSCULAR | Status: DC | PRN
  Administered 2018-04-08: .2 mg via INTRAVENOUS

## 2018-04-08 MED ORDER — CEFAZOLIN SODIUM-DEXTROSE 2-4 GM/100ML-% IV SOLN
2.0000 g | INTRAVENOUS | Status: AC
  Administered 2018-04-08: 2 g via INTRAVENOUS
  Filled 2018-04-08: qty 100

## 2018-04-08 MED ORDER — ONDANSETRON 4 MG PO TBDP: 4.0000 mg | ORAL_TABLET | Freq: Four times a day (QID) | ORAL | Status: DC | PRN

## 2018-04-08 MED ORDER — HYDRALAZINE HCL 50 MG PO TABS
100.0000 mg | ORAL_TABLET | Freq: Two times a day (BID) | ORAL | Status: DC
  Administered 2018-04-08 – 2018-04-09 (×2): 100 mg via ORAL
  Filled 2018-04-08 (×2): qty 2

## 2018-04-08 MED ORDER — ACETAMINOPHEN 500 MG PO TABS
1000.0000 mg | ORAL_TABLET | ORAL | Status: AC
  Administered 2018-04-08: 1000 mg via ORAL
  Filled 2018-04-08: qty 2

## 2018-04-08 MED ORDER — DEXAMETHASONE SODIUM PHOSPHATE 10 MG/ML IJ SOLN
INTRAMUSCULAR | Status: DC | PRN
  Administered 2018-04-08: 5 mg via INTRAVENOUS

## 2018-04-08 MED ORDER — ONDANSETRON HCL 4 MG/2ML IJ SOLN
INTRAMUSCULAR | Status: DC | PRN
  Administered 2018-04-08: 4 mg via INTRAVENOUS

## 2018-04-08 MED ORDER — ATENOLOL-CHLORTHALIDONE 50-25 MG PO TABS: 1.0000 | ORAL_TABLET | Freq: Every day | ORAL | Status: DC

## 2018-04-08 MED ORDER — ONDANSETRON HCL 4 MG/2ML IJ SOLN: 4.0000 mg | Freq: Once | INTRAMUSCULAR | Status: DC | PRN

## 2018-04-08 MED ORDER — ATENOLOL 50 MG PO TABS
50.0000 mg | ORAL_TABLET | Freq: Every day | ORAL | Status: DC
  Administered 2018-04-09: 50 mg via ORAL
  Filled 2018-04-08: qty 1

## 2018-04-08 MED ORDER — ONDANSETRON HCL 4 MG/2ML IJ SOLN: 4.0000 mg | Freq: Four times a day (QID) | INTRAMUSCULAR | Status: DC | PRN

## 2018-04-08 MED ORDER — BUPIVACAINE-EPINEPHRINE (PF) 0.25% -1:200000 IJ SOLN
INTRAMUSCULAR | Status: AC
  Filled 2018-04-08: qty 30

## 2018-04-08 MED ORDER — VITAMIN D 1000 UNITS PO TABS
1000.0000 [IU] | ORAL_TABLET | Freq: Every day | ORAL | Status: DC
  Administered 2018-04-08: 1000 [IU] via ORAL
  Filled 2018-04-08: qty 1

## 2018-04-08 MED ORDER — FLECAINIDE ACETATE 50 MG PO TABS
50.0000 mg | ORAL_TABLET | Freq: Two times a day (BID) | ORAL | Status: DC
  Administered 2018-04-08 – 2018-04-09 (×2): 50 mg via ORAL
  Filled 2018-04-08 (×4): qty 1

## 2018-04-08 MED ORDER — PROPOFOL 10 MG/ML IV BOLUS
INTRAVENOUS | Status: AC
  Filled 2018-04-08: qty 40

## 2018-04-08 MED ORDER — FENTANYL CITRATE (PF) 250 MCG/5ML IJ SOLN
INTRAMUSCULAR | Status: DC | PRN
  Administered 2018-04-08: 25 ug via INTRAVENOUS

## 2018-04-08 MED ORDER — FLUTICASONE PROPIONATE 50 MCG/ACT NA SUSP
1.0000 | Freq: Two times a day (BID) | NASAL | Status: DC
  Administered 2018-04-08 – 2018-04-09 (×2): 1 via NASAL
  Filled 2018-04-08 (×2): qty 16

## 2018-04-08 MED ORDER — SODIUM CHLORIDE 0.9 % IV SOLN
INTRAVENOUS | Status: DC | PRN
  Administered 2018-04-08: 50 ug/min via INTRAVENOUS

## 2018-04-08 MED ORDER — 0.9 % SODIUM CHLORIDE (POUR BTL) OPTIME
TOPICAL | Status: DC | PRN
  Administered 2018-04-08: 1000 mL

## 2018-04-08 MED ORDER — ACETAMINOPHEN 500 MG PO TABS
1000.0000 mg | ORAL_TABLET | Freq: Four times a day (QID) | ORAL | Status: DC
  Administered 2018-04-08 – 2018-04-09 (×5): 1000 mg via ORAL
  Filled 2018-04-08 (×5): qty 2

## 2018-04-08 MED ORDER — FENTANYL CITRATE (PF) 250 MCG/5ML IJ SOLN
INTRAMUSCULAR | Status: AC
  Filled 2018-04-08: qty 5

## 2018-04-08 MED ORDER — PROPOFOL 10 MG/ML IV BOLUS
INTRAVENOUS | Status: DC | PRN
  Administered 2018-04-08: 50 mg via INTRAVENOUS
  Administered 2018-04-08: 100 mg via INTRAVENOUS

## 2018-04-08 MED ORDER — EPHEDRINE SULFATE-NACL 50-0.9 MG/10ML-% IV SOSY
PREFILLED_SYRINGE | INTRAVENOUS | Status: DC | PRN
  Administered 2018-04-08: 15 mg via INTRAVENOUS

## 2018-04-08 MED ORDER — FERROUS SULFATE 325 (65 FE) MG PO TABS
325.0000 mg | ORAL_TABLET | Freq: Two times a day (BID) | ORAL | Status: DC
  Administered 2018-04-08 – 2018-04-09 (×2): 325 mg via ORAL
  Filled 2018-04-08 (×2): qty 1

## 2018-04-08 MED ORDER — OXYCODONE HCL 5 MG PO TABS: 5.0000 mg | ORAL_TABLET | ORAL | Status: DC | PRN

## 2018-04-08 MED ORDER — SODIUM CHLORIDE 0.9 % IV SOLN
INTRAVENOUS | Status: DC
  Administered 2018-04-08: 14:00:00 via INTRAVENOUS

## 2018-04-08 MED ORDER — FENTANYL CITRATE (PF) 100 MCG/2ML IJ SOLN: 25.0000 ug | INTRAMUSCULAR | Status: DC | PRN

## 2018-04-08 MED ORDER — ENSURE PRE-SURGERY PO LIQD
296.0000 mL | Freq: Once | ORAL | Status: DC
  Filled 2018-04-08: qty 296

## 2018-04-08 MED ORDER — BUPIVACAINE-EPINEPHRINE 0.25% -1:200000 IJ SOLN
INTRAMUSCULAR | Status: DC | PRN
  Administered 2018-04-08: 9 mL

## 2018-04-08 MED ORDER — LIDOCAINE 2% (20 MG/ML) 5 ML SYRINGE
INTRAMUSCULAR | Status: DC | PRN
  Administered 2018-04-08: 60 mg via INTRAVENOUS

## 2018-04-08 MED ORDER — CHLORTHALIDONE 25 MG PO TABS
25.0000 mg | ORAL_TABLET | Freq: Every day | ORAL | Status: DC
  Administered 2018-04-09: 25 mg via ORAL
  Filled 2018-04-08 (×3): qty 1

## 2018-04-08 MED ORDER — LACTATED RINGERS IV SOLN
INTRAVENOUS | Status: DC | PRN
  Administered 2018-04-08: 07:00:00 via INTRAVENOUS

## 2018-04-08 MED ORDER — FAMOTIDINE 20 MG PO TABS
20.0000 mg | ORAL_TABLET | Freq: Once | ORAL | Status: AC
  Administered 2018-04-08: 20 mg via ORAL
  Filled 2018-04-08: qty 1

## 2018-04-08 MED ORDER — CALCIUM CARBONATE-VITAMIN D 500-200 MG-UNIT PO TABS
1.0000 | ORAL_TABLET | Freq: Every day | ORAL | Status: DC
  Administered 2018-04-08: 1 via ORAL
  Filled 2018-04-08: qty 1

## 2018-04-08 MED ORDER — MORPHINE SULFATE (PF) 2 MG/ML IV SOLN: 1.0000 mg | INTRAVENOUS | Status: DC | PRN

## 2018-04-08 MED ORDER — ATORVASTATIN CALCIUM 10 MG PO TABS
10.0000 mg | ORAL_TABLET | Freq: Every day | ORAL | Status: DC
  Administered 2018-04-08: 10 mg via ORAL
  Filled 2018-04-08: qty 1

## 2018-04-08 SURGICAL SUPPLY — 49 items
ADH SKN CLS APL DERMABOND .7 (GAUZE/BANDAGES/DRESSINGS) ×1
APPLIER CLIP 9.375 MED OPEN (MISCELLANEOUS) ×3
APR CLP MED 9.3 20 MLT OPN (MISCELLANEOUS) ×1
BINDER BREAST LRG (GAUZE/BANDAGES/DRESSINGS) ×2 IMPLANT
BINDER BREAST XLRG (GAUZE/BANDAGES/DRESSINGS) IMPLANT
BLADE SURG 15 STRL LF DISP TIS (BLADE) ×1 IMPLANT
BLADE SURG 15 STRL SS (BLADE) ×3
CANISTER SUCT 3000ML PPV (MISCELLANEOUS) ×3 IMPLANT
CHLORAPREP W/TINT 26ML (MISCELLANEOUS) ×3 IMPLANT
CLIP APPLIE 9.375 MED OPEN (MISCELLANEOUS) IMPLANT
CLOSURE WOUND 1/2 X4 (GAUZE/BANDAGES/DRESSINGS) ×1
COVER PROBE W GEL 5X96 (DRAPES) ×3 IMPLANT
COVER SURGICAL LIGHT HANDLE (MISCELLANEOUS) ×3 IMPLANT
DERMABOND ADVANCED (GAUZE/BANDAGES/DRESSINGS) ×2
DERMABOND ADVANCED .7 DNX12 (GAUZE/BANDAGES/DRESSINGS) ×1 IMPLANT
DEVICE DUBIN SPECIMEN MAMMOGRA (MISCELLANEOUS) ×3 IMPLANT
DRAPE CHEST BREAST 15X10 FENES (DRAPES) ×3 IMPLANT
DRAPE UTILITY XL STRL (DRAPES) ×3 IMPLANT
ELECT COATED BLADE 2.86 ST (ELECTRODE) ×3 IMPLANT
ELECT REM PT RETURN 9FT ADLT (ELECTROSURGICAL) ×3
ELECTRODE REM PT RTRN 9FT ADLT (ELECTROSURGICAL) ×1 IMPLANT
GLOVE BIO SURGEON STRL SZ7 (GLOVE) ×6 IMPLANT
GLOVE BIOGEL PI IND STRL 7.5 (GLOVE) ×1 IMPLANT
GLOVE BIOGEL PI INDICATOR 7.5 (GLOVE) ×2
GOWN STRL REUS W/ TWL LRG LVL3 (GOWN DISPOSABLE) ×2 IMPLANT
GOWN STRL REUS W/TWL LRG LVL3 (GOWN DISPOSABLE) ×6
ILLUMINATOR WAVEGUIDE N/F (MISCELLANEOUS) ×1 IMPLANT
KIT BASIN OR (CUSTOM PROCEDURE TRAY) ×3 IMPLANT
KIT MARKER MARGIN INK (KITS) ×3 IMPLANT
NDL HYPO 25GX1X1/2 BEV (NEEDLE) ×1 IMPLANT
NEEDLE HYPO 25GX1X1/2 BEV (NEEDLE) ×3 IMPLANT
NS IRRIG 1000ML POUR BTL (IV SOLUTION) ×3 IMPLANT
PACK SURGICAL SETUP 50X90 (CUSTOM PROCEDURE TRAY) ×3 IMPLANT
PENCIL BUTTON HOLSTER BLD 10FT (ELECTRODE) ×3 IMPLANT
SPONGE LAP 18X18 X RAY DECT (DISPOSABLE) ×3 IMPLANT
STRIP CLOSURE SKIN 1/2X4 (GAUZE/BANDAGES/DRESSINGS) ×2 IMPLANT
SUT MNCRL AB 4-0 PS2 18 (SUTURE) ×3 IMPLANT
SUT SILK 2 0 SH (SUTURE) ×2 IMPLANT
SUT VIC AB 2-0 SH 27 (SUTURE) ×3
SUT VIC AB 2-0 SH 27XBRD (SUTURE) ×1 IMPLANT
SUT VIC AB 3-0 SH 27 (SUTURE) ×3
SUT VIC AB 3-0 SH 27X BRD (SUTURE) ×1 IMPLANT
SYR BULB 3OZ (MISCELLANEOUS) ×3 IMPLANT
SYR CONTROL 10ML LL (SYRINGE) ×3 IMPLANT
TOWEL OR 17X24 6PK STRL BLUE (TOWEL DISPOSABLE) ×3 IMPLANT
TOWEL OR 17X26 10 PK STRL BLUE (TOWEL DISPOSABLE) ×3 IMPLANT
TUBE CONNECTING 12'X1/4 (SUCTIONS) ×1
TUBE CONNECTING 12X1/4 (SUCTIONS) ×2 IMPLANT
YANKAUER SUCT BULB TIP NO VENT (SUCTIONS) ×3 IMPLANT

## 2018-04-08 NOTE — Anesthesia Postprocedure Evaluation (Addendum)
Anesthesia Post Note  Patient: [name]  Procedure(s) Performed: BREAST LUMPECTOMY WITH RADIOACTIVE SEED LOCALIZATION (Right Breast)     Patient location during evaluation: PACU Anesthesia Type: General Level of consciousness: awake and alert Pain management: pain level controlled Vital Signs Assessment: post-procedure vital signs reviewed and stable Respiratory status: spontaneous breathing, nonlabored ventilation, respiratory function stable and patient connected to nasal cannula oxygen Cardiovascular status: blood pressure returned to baseline and stable Postop Assessment: no apparent nausea or vomiting Anesthetic complications: yes Anesthetic complication details: injury of skin or soft tissueComments: Patient and family notified of ecchymosis to medial right eyelid. Felt to be a result of adhesive tape. Patient reports bruising easily. Paper tape recommended for future use. Potential adhesive allergy discussed.     Last Vitals:  Vitals:   04/08/18 1101 04/08/18 1127  BP: (!) 140/55 (!) 145/63  Pulse: 66 61  Resp: 20   Temp:  36.5 C  SpO2: 93% 96%    Last Pain:  Vitals:   04/08/18 1127  TempSrc: Oral  PainSc:                  Ryan P Ellender

## 2018-04-08 NOTE — Transfer of Care (Signed)
Immediate Anesthesia Transfer of Care Note  Patient: [name]  Procedure(s) Performed: BREAST LUMPECTOMY WITH RADIOACTIVE SEED LOCALIZATION (Right Breast)  Patient Location: PACU  Anesthesia Type:General  Level of Consciousness: awake, alert  and oriented  Airway & Oxygen Therapy: Patient Spontanous Breathing and Patient connected to nasal cannula oxygen  Post-op Assessment: Report given to RN and Post -op Vital signs reviewed and stable  Post vital signs: Reviewed and stable  Last Vitals:  Vitals Value Taken Time  BP 133/54 04/08/2018  8:32 AM  Temp    Pulse 68 04/08/2018  8:37 AM  Resp 15 04/08/2018  8:37 AM  SpO2 99 % 04/08/2018  8:37 AM  Vitals shown include unvalidated device data.  Last Pain:  Vitals:   04/08/18 0832  TempSrc:   PainSc: (P) 0-No pain         Complications: No apparent anesthesia complications

## 2018-04-08 NOTE — H&P (Signed)
84 yof referred by Dr Morrie Sheldon for new right breast cancer. She has fh of breast cancer in paternal aunt at 43. she has prior benign left breast biopsy. she had no mass or dc. she had screening mm that shows 2 right breast masses. one of these cleared on further views. there is an oval mass on right that measures 1.1 cm on [place] at 8 oclock. her axillary [place] is negative. core biopsy of 1.1 cm mass was done that shows a grade I IDC with mucin/dcis that is er/pr pos, her 2 negative and Ki is 1%. she is here today to discuss options.   Past Surgical History  Breast Biopsy  Right. Knee Surgery  Right. Thyroid Surgery   Diagnostic Studies History  Colonoscopy  5-10 years ago Mammogram  within last year Pap Smear  1-5 years ago  Medication History  Medications Reconciled  Social History  No alcohol use  No caffeine use  No drug use  Tobacco use  Never smoker.  Family History  Cerebrovascular Accident  Father.  Pregnancy / Birth History  Age at menarche  31 years. Age of menopause  52-55 Gravida  2 Maternal age  60-25 Para  2  Other Problems  Arthritis  High blood pressure  Thyroid Disease   Review of Systems  General Not Present- Appetite Loss, Chills, Fatigue, Fever, Night Sweats, Weight Gain and Weight Loss. Skin Not Present- Change in Wart/Mole, Dryness, Hives, Jaundice, New Lesions, Non-Healing Wounds, Rash and Ulcer. HEENT Present- Wears glasses/contact lenses. Not Present- Earache, Hearing Loss, Hoarseness, Nose Bleed, Oral Ulcers, Ringing in the Ears, Seasonal Allergies, Sinus Pain, Sore Throat, Visual Disturbances and Yellow Eyes. Respiratory Not Present- Bloody sputum, Chronic Cough, Difficulty Breathing, Snoring and Wheezing. Breast Not Present- Breast Mass, Breast Pain, Nipple Discharge and Skin Changes. Cardiovascular Not Present- Chest Pain, Difficulty Breathing Lying Down, Leg Cramps, Palpitations, Rapid Heart Rate, Shortness of Breath and  Swelling of Extremities. Gastrointestinal Not Present- Abdominal Pain, Bloating, Bloody Stool, Change in Bowel Habits, Chronic diarrhea, Constipation, Difficulty Swallowing, Excessive gas, Gets full quickly at meals, Hemorrhoids, Indigestion, Nausea, Rectal Pain and Vomiting. Female Genitourinary Not Present- Frequency, Nocturia, Painful Urination, Pelvic Pain and Urgency. Musculoskeletal Not Present- Back Pain, Joint Pain, Joint Stiffness, Muscle Pain, Muscle Weakness and Swelling of Extremities. Neurological Not Present- Decreased Memory, Fainting, Headaches, Numbness, Seizures, Tingling, Tremor, Trouble walking and Weakness. Psychiatric Not Present- Anxiety, Bipolar, Change in Sleep Pattern, Depression, Fearful and Frequent crying. Endocrine Not Present- Cold Intolerance, Excessive Hunger, Hair Changes, Heat Intolerance, Hot flashes and New Diabetes. Hematology Present- Easy Bruising. Not Present- Blood Thinners, Excessive bleeding, Gland problems, HIV and Persistent Infections.   Physical Exam  General Mental Status-Alert. Head and Neck Trachea-midline. Thyroid Gland Characteristics - normal size and consistency. Eye Sclera/Conjunctiva - Bilateral-No scleral icterus. Chest and Lung Exam Chest and lung exam reveals -quiet, even and easy respiratory effort with no use of accessory muscles and on auscultation, normal breath sounds, no adventitious sounds and normal vocal resonance. Breast Nipples-No Discharge. Breast Lump-No Palpable Breast Mass. Cardiovascular Cardiovascular examination reveals -normal heart sounds, regular rate and rhythm with no murmurs and no digital clubbing, cyanosis, edema, increased warmth or tenderness. Abdomen Note: soft nt Neurologic Neurologic evaluation reveals -alert and oriented x 3 with no impairment of recent or remote memory. Neuropsychiatric Mental status exam performed with findings of-Oriented X3 with appropriate mood and  affect. Lymphatic Head & Neck General Head & Neck Lymphatics: Bilateral - Description - Normal. Axillary General Axillary  Region: Bilateral - Description - Normal. Note: no [place] adenopathy   Assessment & Plan  BREAST CANCER OF LOWER-OUTER QUADRANT OF RIGHT FEMALE BREAST (C50.511) Story: Right breast seed guided lumpectomy does not need sentinel node due to age, tumor grade and type per choosing wisely guidelines and latest literature We discussed the options for treatment of the breast cancer which included lumpectomy versus a mastectomy. We discussed the performance of the lumpectomy with radioactive seed placement. We discussed a 5% chance of a positive margin requiring reexcision in the operating room. We also discussed that she will probably not need radiation therapy if she undergoes lumpectomy. The breast cannot undergo more radiation therapy in the same breast after lumpectomy in the future. We discussed the mastectomy and the postoperative care for that as well. Mastectomy can be followed by reconstruction. This is a more extensive surgery and requires more recovery. The decision for lumpectomy vs mastectomy has no impact on decision for chemotherapy. Most mastectomy patients will not need radiation therapy. We discussed that there is no difference in her survival whether she undergoes lumpectomy with radiation therapy or antiestrogen therapy versus a mastectomy. There is also no real difference between her recurrence in the breast. We discussed the risks of operation including bleeding, infection, possible reoperation. She understands her further therapy will be based on what her stages at the time of her operation.

## 2018-04-08 NOTE — Discharge Instructions (Signed)
[facility] °Office Phone Number [phone] °POST OP INSTRUCTIONS ° °Always review your discharge instruction sheet given to you by the facility where your surgery was performed. ° °IF YOU HAVE DISABILITY OR FAMILY LEAVE FORMS, YOU MUST BRING THEM TO THE OFFICE FOR PROCESSING.  DO NOT GIVE THEM TO YOUR DOCTOR. ° °1. A prescription for pain medication may be given to you upon discharge.  Take your pain medication as prescribed, if needed.  If narcotic pain medicine is not needed, then you may take acetaminophen (Tylenol), naprosyn (Alleve) or ibuprofen (Advil) as needed. °2. Take your usually prescribed medications unless otherwise directed °3. If you need a refill on your pain medication, please contact your pharmacy.  They will contact our office to request authorization.  Prescriptions will not be filled after 5pm or on week-ends. °4. You should eat very light the first 24 hours after surgery, such as soup, crackers, pudding, etc.  Resume your normal diet the day after surgery. °5. Most patients will experience some swelling and bruising in the breast.  Ice packs and a good support bra will help.  Wear the breast binder provided or a sports bra for 72 hours day and night.  After that wear a sports bra during the day until you return to the office. Swelling and bruising can take several days to resolve.  °6. It is common to experience some constipation if taking pain medication after surgery.  Increasing fluid intake and taking a stool softener will usually help or prevent this problem from occurring.  A mild laxative (Milk of Magnesia or Miralax) should be taken according to package directions if there are no bowel movements after 48 hours. °7. Unless discharge instructions indicate otherwise, you may remove your bandages 48 hours after surgery and you may shower at that time.  You may have steri-strips (small skin tapes) in place directly over the incision.  These strips should be left on the  skin for 7-10 days and will come off on their own.  If your surgeon used skin glue on the incision, you may shower in 24 hours.  The glue will flake off over the next 2-3 weeks.  Any sutures or staples will be removed at the office during your follow-up visit. °8. ACTIVITIES:  You may resume regular daily activities (gradually increasing) beginning the next day.  Wearing a good support bra or sports bra minimizes pain and swelling.  You may have sexual intercourse when it is comfortable. °a. You may drive when you no longer are taking prescription pain medication, you can comfortably wear a seatbelt, and you can safely maneuver your car and apply brakes. °b. RETURN TO WORK:  ______________________________________________________________________________________ °9. You should see your doctor in the office for a follow-up appointment approximately two weeks after your surgery.  Your doctor’s nurse will typically make your follow-up appointment when she calls you with your pathology report.  Expect your pathology report 3-4 business days after your surgery.  You may call to check if you do not hear from us after three days. °10. OTHER INSTRUCTIONS: _______________________________________________________________________________________________ _____________________________________________________________________________________________________________________________________ °_____________________________________________________________________________________________________________________________________ °_____________________________________________________________________________________________________________________________________ ° °WHEN TO CALL DR Betsy Rosello: °1. Fever over 101.0 °2. Nausea and/or vomiting. °3. Extreme swelling or bruising. °4. Continued bleeding from incision. °5. Increased pain, redness, or drainage from the incision. ° °The clinic staff is available to answer your questions during regular  business hours.  Please don’t hesitate to call and ask to speak to one of the nurses for clinical concerns.  If you   have a medical emergency, go to the nearest emergency room or call 911.  A surgeon from [facility] is always on call at the hospital. ° °For further questions, please visit [email or web address] ° °

## 2018-04-08 NOTE — Anesthesia Procedure Notes (Signed)
Procedure Name: LMA Insertion Date/Time: 04/08/2018 7:36 AM Performed by: Valda Favia, CRNA Pre-anesthesia Checklist: Patient identified, Emergency Drugs available, Suction available and Patient being monitored Patient Re-evaluated:Patient Re-evaluated prior to induction Oxygen Delivery Method: Circle System Utilized Preoxygenation: Pre-oxygenation with 100% oxygen Induction Type: IV induction Ventilation: Mask ventilation without difficulty LMA: LMA inserted LMA Size: 4.0 Number of attempts: 1 Airway Equipment and Method: Bite block Placement Confirmation: positive ETCO2 and breath sounds checked- equal and bilateral Tube secured with: Tape Dental Injury: Teeth and Oropharynx as per pre-operative assessment

## 2018-04-08 NOTE — Progress Notes (Signed)
Patient arrived to unit. Patient is alert and oriented. Will implement orders and continue to monitor.

## 2018-04-08 NOTE — Op Note (Signed)
Preoperative diagnosis: Clinical stage I right breast cancer Postoperative diagnosis: Same as above Procedure:  Right breast seed guided lumpectomy Surgeon: Dr. Serita Grammes Anesthesia: General  Estimated blood loss: Minimal Specimens: 1.  Right breast tissue marked with paint 2.  Additional right breast medial margin marked short stitch superior, long stitch lateral, double stitch deep 3.  Additional right breast inferior margin marked short stitch superior, long stitch lateral, double stitch deep 4.   Additional right breast lateral margin marked short stitch superior, long stitch lateral, double stitch deep Complications: None Drains: None Special count was correct at completion Disposition to recovery in stable condition  Indications: This is a 84 year old female who has clinical stage I right breast cancer.   We have elected to proceed with a lumpectomy.  She had a seed placed in the breast mass prior to beginning.  I had these mammograms available in the operating room.  Procedure: After informed consent was obtained the patient was taken to the operating room.  She was given antibiotics.  SCDs were in place.  She was placed under general anesthesia without complication.  Her breast was prepped and draped in the standard sterile surgical fashion.  A surgical timeout was then performed.  I infiltrated marcaine. I then made a curvilinear incision over the tumor.   I then used the neoprobe to guide the excision of the cancer with an attempt to get a normal rim of tissue around it.  I then marked this with paint.  Mammogram confirmed removal of the seed and the clip.  I did take 3 additional margins that I thought might be close.  These are the anterior and inferior margins and they are marked as above.  I placed clips in this cavity.  I then closed the breast cavity completely down with 2-0 Vicryl suture.  I then closed the dermis and the skin with 3-0 Vicryl 4-0 Monocryl.  Glue and  Steri-Strips were applied.  She tolerated this well was extubated and transferred to recovery stable.

## 2018-04-08 NOTE — Interval H&P Note (Signed)
History and Physical Interval Note:  04/08/2018 7:04 AM  [name]  has presented today for surgery, with the diagnosis of RIGHT BREAST CANCER  The various methods of treatment have been discussed with the patient and family. After consideration of risks, benefits and other options for treatment, the patient has consented to  Procedure(s): BREAST LUMPECTOMY WITH RADIOACTIVE SEED LOCALIZATION (Right) as a surgical intervention .  The patient's history has been reviewed, patient examined, no change in status, stable for surgery.  I have reviewed the patient's chart and labs.  Questions were answered to the patient's satisfaction.     Rolm Bookbinder

## 2018-04-09 ENCOUNTER — Encounter (HOSPITAL_COMMUNITY): Payer: Self-pay | Admitting: General Surgery

## 2018-04-09 DIAGNOSIS — C50511 Malignant neoplasm of lower-outer quadrant of right female breast: Secondary | ICD-10-CM | POA: Diagnosis not present

## 2018-04-09 NOTE — Progress Notes (Signed)
Patient given discharge instructions. Patient verbalized understanding amd aware of follow up appointments. Patient left unit in stable condition with via wheelchair with nursing staff.

## 2018-04-09 NOTE — Discharge Summary (Signed)
Physician Discharge Summary  Patient ID: [name] MRN: [id number] DOB/AGE: 1933/10/17 84 y.o.  Admit date: 04/08/2018 Discharge date: 04/09/2018  Admission Diagnoses: Breast cancer CKD, stage III htn Hyperlipidemia Atrial fibrilllation  Discharge Diagnoses:  Active Problems:   Breast cancer, right [facility])  Discharge condition good  Hospital Course: 66 yof s/p right breast lumpectomy for right breast cancer. She remained overnight on monitor due to cardiac history and did well. She is ready for discharge.   Consults: none  Significant Diagnostic Studies: none  Treatments:right breast seed guided lumpectomy  Discharge Exam: Blood pressure (!) 163/51, pulse (!) 49, temperature 97.6 F (36.4 C), temperature source Oral, resp. rate 17, height 5\' 6"  (1.676 m), weight 57 kg, SpO2 97 %. Breast without hematoma or infection  Disposition: home   Allergies as of 04/09/2018      Reactions   Amlodipine Other (See Comments)   edema   Isosorbide Nitrate Nausea Only   Sulfonamide Derivatives Nausea Only, Rash   Terazosin Hives, Nausea Only      Medication List    TAKE these medications   acetaminophen 325 MG tablet Commonly known as:  TYLENOL Take 325 mg by mouth daily as needed for moderate pain or headache.   atenolol-chlorthalidone 50-25 MG tablet Commonly known as:  TENORETIC TAKE 1 TABLET BY MOUTH DAILY.   atorvastatin 20 MG tablet Commonly known as:  LIPITOR TAKE 1/2 TABLET DAILY What changed:  when to take this   calcium-vitamin D 500-200 MG-UNIT tablet Commonly known as:  OSCAL WITH D Take 1 tablet by mouth at bedtime.   cholecalciferol 1000 units tablet Commonly known as:  VITAMIN D Take 1,000 Units by mouth at bedtime.   ferrous sulfate 325 (65 FE) MG tablet Take 325 mg by mouth 2 (two) times daily with a meal.   flecainide 50 MG tablet Commonly known as:  TAMBOCOR TAKE 1 TABLET (50 MG TOTAL) BY MOUTH 2 (TWO) TIMES DAILY.   fluticasone 50  MCG/ACT nasal spray Commonly known as:  FLONASE Place 1 spray into both nostrils 2 (two) times daily.   hydrALAZINE 100 MG tablet Commonly known as:  APRESOLINE Take 100 mg by mouth 2 (two) times daily.   hydroxypropyl methylcellulose / hypromellose 2.5 % ophthalmic solution Commonly known as:  ISOPTO TEARS / GONIOVISC Place 1 drop into both eyes daily.   levothyroxine 75 MCG tablet Commonly known as:  SYNTHROID, LEVOTHROID TAKE 1 TABLET (75 MCG TOTAL) BY MOUTH DAILY.   lisinopril 40 MG tablet Commonly known as:  PRINIVIL,ZESTRIL Take 20 mg by mouth daily.      Follow-up Information    Rolm Bookbinder, MD In 3 weeks.   Specialty:  General Surgery Contact information: [place] [address] [phone]           Signed: Rolm Bookbinder 04/09/2018, 4:50 PM

## 2018-04-16 ENCOUNTER — Telehealth: Payer: Self-pay | Admitting: Hematology

## 2018-04-16 NOTE — Telephone Encounter (Signed)
Let her know that Dr. Donne Hazel does not prescript the pill, she still needs to see [place] when she recovers well from her surgery. Thanks.   Truitt Merle MD

## 2018-04-16 NOTE — Telephone Encounter (Signed)
Returned call to patient re appointment.   Per patient cx 9/6 due to she had surgery and Dr. Donne Hazel says appointment not needed and all she will have to do is take a pill for 5 years. Patient informed appointment w/[facility] may still be needed.   Per patient she sees Dr. Donne Hazel again next week and wants to speak with him first. Patient asled if she wanted to rescheduled with [facility] for after f/u with Dr. Donne Hazel. Per patient cx 9/6 for now, she will call back if needed after seeing Dr. Donne Hazel.   This message  routed to [place].

## 2018-04-21 ENCOUNTER — Other Ambulatory Visit: Payer: Self-pay | Admitting: Family Medicine

## 2018-04-22 ENCOUNTER — Ambulatory Visit: Payer: PPO | Admitting: Hematology

## 2018-04-23 ENCOUNTER — Ambulatory Visit: Payer: PPO | Admitting: Nurse Practitioner

## 2018-04-27 ENCOUNTER — Ambulatory Visit: Payer: PPO | Admitting: Radiation Oncology

## 2018-04-27 ENCOUNTER — Ambulatory Visit: Payer: PPO

## 2018-05-04 NOTE — Progress Notes (Signed)
Cardiology Office Note   Date:  05/05/2018   ID:  [name], DOB Sep 13, 1933, MRN [id number]  PCP:  Chipper Herb, MD  Cardiologist:   Minus Breeding, MD    Chief Complaint  Patient presents with  . Palpitations      History of Present Illness: [name] is a 84 y.o. female who presents for follow up of difficult to control with chronic diastolic CHF, and arrhythmias - seen by Dr Caryl Comes - left atrial tachycardias, that resolved on Flecainide.  She was last seen prior to lumpectomy.   She had an echo with evidence of diastolic dysfunction and aortic valve sclerosis.   She has RAE and LAE.    She presents for follow-up.  She has no new cardiovascular complaints.  Her palpitations are well controlled on flecainide.  I do note that her blood counts are stable.  The white blood count was slightly lower but the platelets were back above normal when they were last checked.  She has follow-up with hematology in October.  She denies any new cardiovascular symptoms.  She has no palpitations, presyncope or syncope.  She has no chest pressure, neck or arm discomfort.  She is had no weight gain or edema.  Past Medical History:  Diagnosis Date  . Anemia   . Arthritis   . Asthma   . Atrial fibrillation ([place])   . CKD (chronic kidney disease), stage III ([place])   . Heart murmur   . Hyperlipidemia   . Hypertension   . Hypothyroidism   . Nodule of right lung    Right upper lobe  . PONV (postoperative nausea and vomiting)   . Renal insufficiency    Chronic  . Renal vascular disease   . Right renal artery stenosis ([place]) 05/09/2015  . Vertigo     Past Surgical History:  Procedure Laterality Date  . BREAST LUMPECTOMY WITH RADIOACTIVE SEED LOCALIZATION Right 04/08/2018   Procedure: BREAST LUMPECTOMY WITH RADIOACTIVE SEED LOCALIZATION;  Surgeon: Rolm Bookbinder, MD;  Location: [facility];  Service: General;  Laterality: Right;  . IR GENERIC HISTORICAL  08/29/2016   IR US GUIDE VASC  ACCESS RIGHT 08/29/2016 [facility]  . IR GENERIC HISTORICAL  08/29/2016   IR RENAL BILAT S&I MOD SED 08/29/2016 [facility]  . KNEE ARTHROSCOPY     2019  . THYROIDECTOMY, PARTIAL Right 1981   middle lobe removed 1st, then right lobe removed 7-8 years later (approx. 1988)     Current Outpatient Medications  Medication Sig Dispense Refill  . acetaminophen (TYLENOL) 325 MG tablet Take 325 mg by mouth daily as needed for moderate pain or headache.    Marland Kitchen atenolol-chlorthalidone (TENORETIC) 50-25 MG tablet TAKE 1 TABLET BY MOUTH DAILY. 90 tablet 1  . atorvastatin (LIPITOR) 20 MG tablet TAKE 1/2 TABLET DAILY 45 tablet 0  . calcium-vitamin D (OSCAL WITH D) 500-200 MG-UNIT per tablet Take 1 tablet by mouth at bedtime.     . cholecalciferol (VITAMIN D) 1000 units tablet Take 1,000 Units by mouth daily.    . ferrous sulfate 325 (65 FE) MG tablet Take 325 mg by mouth 2 (two) times daily with a meal.    . flecainide (TAMBOCOR) 50 MG tablet TAKE 1 TABLET (50 MG TOTAL) BY MOUTH 2 (TWO) TIMES DAILY. 180 tablet 60  . fluticasone (FLONASE) 50 MCG/ACT nasal spray Place 1 spray into both nostrils 2 (two) times daily.     . hydrALAZINE (APRESOLINE) 100 MG tablet Take  100 mg by mouth 2 (two) times daily.    . hydroxypropyl methylcellulose / hypromellose (ISOPTO TEARS / GONIOVISC) 2.5 % ophthalmic solution Place 1 drop into both eyes daily.     Marland Kitchen levothyroxine (SYNTHROID, LEVOTHROID) 75 MCG tablet TAKE 1 TABLET (75 MCG TOTAL) BY MOUTH DAILY. 90 tablet 3  . lisinopril (PRINIVIL,ZESTRIL) 40 MG tablet Take 20 mg by mouth daily.      No current facility-administered medications for this visit.     Allergies:   Amlodipine; Isosorbide nitrate; Sulfonamide derivatives; and Terazosin    ROS:  Please see the history of present illness.   Otherwise, review of systems are positive for none.   All other systems are reviewed and negative.    PHYSICAL EXAM: VS:  BP (!) 159/61   Pulse (!) 49   Ht 5\' 6"  (1.676 m)    Wt 126 lb (57.2 kg)   BMI 20.34 kg/m  , BMI Body mass index is 20.34 kg/m.  GENERAL:  Well appearing NECK:  No jugular venous distention, waveform within normal limits, carotid upstroke brisk and symmetric, no bruits, no thyromegaly LUNGS:  Clear to auscultation bilaterally CHEST:  Unremarkable HEART:  PMI not displaced or sustained,S1 and S2 within normal limits, no S3, no S4, no clicks, no rubs, soft apical systolic murmur radiating slightly at the aortic outflow tract and into the carotids, no diastolic murmurs ABD:  Flat, positive bowel sounds normal in frequency in pitch, no bruits, no rebound, no guarding, no midline pulsatile mass, no hepatomegaly, no splenomegaly EXT:  2 plus pulses throughout, no edema, no cyanosis no clubbing   EKG:  EKG is  not ordered today.    Recent Labs: 03/03/2018: ALT 14 04/02/2018: BUN 22; Creatinine, Ser 1.38; Hemoglobin 10.9; Platelets 171; Potassium 3.8; Sodium 136     Wt Readings from Last 3 Encounters:  05/05/18 126 lb (57.2 kg)  04/08/18 125 lb 10.6 oz (57 kg)  04/02/18 124 lb 3.2 oz (56.3 kg)      Other studies Reviewed: Additional studies/ records that were reviewed today include:  Labs Review of the above records demonstrates:    ASSESSMENT AND PLAN:  HFpEF -    she seems to be euvolemic.  No change in therapy.  Left atrial tachycardia -      She is not having any symptoms.  For now we will continue flecainide as below.  HTN -    Her blood pressure is controlled.  Continue current therapy. nned.   Carotid Stenosis -   This was less than 50% bilateral in August 2017.   In Nov there was no significant stenosis.  No change in therapy.   Thrombocytopenia/leukopenia -   we discussed the small possibility that this could be related with flecainide.  This has been reported.  However, I have wanted her to confer with her hematologist to see if there was an alternative diagnosis prior to discontinuing the flecainide.  She would likely have  symptomatic recurrence of tachypalpitations but we certainly could stop this for 3 to 6 months if there is no alternative diagnosis particularly if her counts are falling.  I again had a long discussion with her about this and she seems to understand and I be happy to talk to Dr. Walden Field about this.     Current medicines are reviewed at length with the patient today.  The patient does not have concerns regarding medicines.  The following changes have been made:   None  Labs/ tests  ordered today include: None  No orders of the defined types were placed in this encounter.    Disposition:   FU with me in 6 months.     Signed, Minus Breeding, MD  05/05/2018 11:30 AM    [facility]

## 2018-05-05 ENCOUNTER — Ambulatory Visit: Payer: PPO | Admitting: Cardiology

## 2018-05-05 ENCOUNTER — Encounter: Payer: Self-pay | Admitting: Cardiology

## 2018-05-05 VITALS — BP 159/61 | HR 49 | Ht 66.0 in | Wt 126.0 lb

## 2018-05-05 DIAGNOSIS — I350 Nonrheumatic aortic (valve) stenosis: Secondary | ICD-10-CM | POA: Diagnosis not present

## 2018-05-05 DIAGNOSIS — R002 Palpitations: Secondary | ICD-10-CM | POA: Diagnosis not present

## 2018-05-05 DIAGNOSIS — I10 Essential (primary) hypertension: Secondary | ICD-10-CM | POA: Diagnosis not present

## 2018-05-05 DIAGNOSIS — D696 Thrombocytopenia, unspecified: Secondary | ICD-10-CM

## 2018-05-05 NOTE — Patient Instructions (Signed)
Medication Instructions:  The current medical regimen is effective;  continue present plan and medications.  Follow-Up: Follow up in 6 months with Dr. Hochrein.  You will receive a letter in the mail 2 months before you are due.  Please call us when you receive this letter to schedule your follow up appointment.  If you need a refill on your cardiac medications before your next appointment, please call your pharmacy.  Thank you for choosing [facility]!!       

## 2018-05-18 ENCOUNTER — Inpatient Hospital Stay (HOSPITAL_COMMUNITY): Payer: PPO | Attending: Hematology

## 2018-05-18 ENCOUNTER — Ambulatory Visit (HOSPITAL_COMMUNITY): Payer: PPO | Admitting: Internal Medicine

## 2018-05-18 DIAGNOSIS — Z79899 Other long term (current) drug therapy: Secondary | ICD-10-CM | POA: Insufficient documentation

## 2018-05-18 DIAGNOSIS — I10 Essential (primary) hypertension: Secondary | ICD-10-CM | POA: Diagnosis not present

## 2018-05-18 DIAGNOSIS — Z79811 Long term (current) use of aromatase inhibitors: Secondary | ICD-10-CM | POA: Diagnosis not present

## 2018-05-18 DIAGNOSIS — D696 Thrombocytopenia, unspecified: Secondary | ICD-10-CM | POA: Diagnosis not present

## 2018-05-18 DIAGNOSIS — M8580 Other specified disorders of bone density and structure, unspecified site: Secondary | ICD-10-CM | POA: Insufficient documentation

## 2018-05-18 DIAGNOSIS — C50511 Malignant neoplasm of lower-outer quadrant of right female breast: Secondary | ICD-10-CM | POA: Diagnosis not present

## 2018-05-18 DIAGNOSIS — D61818 Other pancytopenia: Secondary | ICD-10-CM

## 2018-05-18 DIAGNOSIS — Z170 Estrogen receptor positive status [ER+]: Secondary | ICD-10-CM | POA: Insufficient documentation

## 2018-05-18 LAB — COMPREHENSIVE METABOLIC PANEL
ALBUMIN: 4 g/dL (ref 3.5–5.0)
ALK PHOS: 62 U/L (ref 38–126)
ALT: 15 U/L (ref 0–44)
AST: 21 U/L (ref 15–41)
Anion gap: 9 (ref 5–15)
BILIRUBIN TOTAL: 1.1 mg/dL (ref 0.3–1.2)
BUN: 27 mg/dL — AB (ref 8–23)
CALCIUM: 9 mg/dL (ref 8.9–10.3)
CO2: 29 mmol/L (ref 22–32)
Chloride: 99 mmol/L (ref 98–111)
Creatinine, Ser: 1.48 mg/dL — ABNORMAL HIGH (ref 0.44–1.00)
GFR calc Af Amer: 37 mL/min — ABNORMAL LOW (ref >60)
GFR calc non Af Amer: 32 mL/min — ABNORMAL LOW (ref >60)
GLUCOSE: 107 mg/dL — AB (ref 70–99)
Potassium: 3.8 mmol/L (ref 3.5–5.1)
Sodium: 137 mmol/L (ref 135–145)
TOTAL PROTEIN: 7.7 g/dL (ref 6.5–8.1)

## 2018-05-18 LAB — CBC WITH DIFFERENTIAL/PLATELET
BASOS ABS: 0 10*3/uL (ref 0.0–0.1)
BASOS PCT: 0 %
Eosinophils Absolute: 0 10*3/uL (ref 0.0–0.7)
Eosinophils Relative: 1 %
HEMATOCRIT: 34.3 % — AB (ref 36.0–46.0)
HEMOGLOBIN: 11.3 g/dL — AB (ref 12.0–15.0)
Lymphocytes Relative: 22 %
Lymphs Abs: 0.6 10*3/uL — ABNORMAL LOW (ref 0.7–4.0)
MCH: 29.5 pg (ref 26.0–34.0)
MCHC: 32.9 g/dL (ref 30.0–36.0)
MCV: 89.6 fL (ref 78.0–100.0)
Monocytes Absolute: 0.3 10*3/uL (ref 0.1–1.0)
Monocytes Relative: 13 %
NEUTROS ABS: 1.6 10*3/uL — AB (ref 1.7–7.7)
NEUTROS PCT: 64 %
Platelets: 107 10*3/uL — ABNORMAL LOW (ref 150–400)
RBC: 3.83 MIL/uL — AB (ref 3.87–5.11)
RDW: 13.8 % (ref 11.5–15.5)
WBC: 2.5 10*3/uL — AB (ref 4.0–10.5)

## 2018-05-18 LAB — LACTATE DEHYDROGENASE: LDH: 143 U/L (ref 98–192)

## 2018-05-19 ENCOUNTER — Other Ambulatory Visit (HOSPITAL_COMMUNITY): Payer: PPO

## 2018-05-21 ENCOUNTER — Other Ambulatory Visit (HOSPITAL_COMMUNITY): Payer: PPO

## 2018-05-26 ENCOUNTER — Other Ambulatory Visit: Payer: PPO

## 2018-05-26 DIAGNOSIS — D696 Thrombocytopenia, unspecified: Secondary | ICD-10-CM | POA: Diagnosis not present

## 2018-05-26 DIAGNOSIS — N183 Chronic kidney disease, stage 3 unspecified: Secondary | ICD-10-CM

## 2018-05-26 DIAGNOSIS — E559 Vitamin D deficiency, unspecified: Secondary | ICD-10-CM

## 2018-05-26 DIAGNOSIS — I10 Essential (primary) hypertension: Secondary | ICD-10-CM

## 2018-05-26 DIAGNOSIS — E7800 Pure hypercholesterolemia, unspecified: Secondary | ICD-10-CM | POA: Diagnosis not present

## 2018-05-27 LAB — CBC WITH DIFFERENTIAL/PLATELET
BASOS ABS: 0 10*3/uL (ref 0.0–0.2)
Basos: 1 %
EOS (ABSOLUTE): 0 10*3/uL (ref 0.0–0.4)
EOS: 1 %
HEMOGLOBIN: 10.8 g/dL — AB (ref 11.1–15.9)
Hematocrit: 32.1 % — ABNORMAL LOW (ref 34.0–46.6)
IMMATURE GRANULOCYTES: 0 %
Immature Grans (Abs): 0 10*3/uL (ref 0.0–0.1)
LYMPHS ABS: 0.6 10*3/uL — AB (ref 0.7–3.1)
Lymphs: 33 %
MCH: 28.9 pg (ref 26.6–33.0)
MCHC: 33.6 g/dL (ref 31.5–35.7)
MCV: 86 fL (ref 79–97)
Monocytes Absolute: 0.2 10*3/uL (ref 0.1–0.9)
Monocytes: 12 %
NEUTROS PCT: 53 %
Neutrophils Absolute: 1 10*3/uL — ABNORMAL LOW (ref 1.4–7.0)
PLATELETS: 86 10*3/uL — AB (ref 150–450)
RBC: 3.74 x10E6/uL — AB (ref 3.77–5.28)
RDW: 12.8 % (ref 12.3–15.4)
WBC: 1.8 10*3/uL — AB (ref 3.4–10.8)

## 2018-05-27 LAB — LIPID PANEL
CHOLESTEROL TOTAL: 140 mg/dL (ref 100–199)
Chol/HDL Ratio: 2.8 ratio (ref 0.0–4.4)
HDL: 50 mg/dL (ref >39)
LDL Calculated: 72 mg/dL (ref 0–99)
Triglycerides: 88 mg/dL (ref 0–149)
VLDL CHOLESTEROL CAL: 18 mg/dL (ref 5–40)

## 2018-05-27 LAB — HEPATIC FUNCTION PANEL
ALT: 12 IU/L (ref 0–32)
AST: 21 IU/L (ref 0–40)
Albumin: 4.1 g/dL (ref 3.5–4.7)
Alkaline Phosphatase: 65 IU/L (ref 39–117)
BILIRUBIN, DIRECT: 0.1 mg/dL (ref 0.00–0.40)
Bilirubin Total: 0.4 mg/dL (ref 0.0–1.2)
Total Protein: 6.7 g/dL (ref 6.0–8.5)

## 2018-05-27 LAB — BMP8+EGFR
BUN/Creatinine Ratio: 19 (ref 12–28)
BUN: 26 mg/dL (ref 8–27)
CHLORIDE: 98 mmol/L (ref 96–106)
CO2: 27 mmol/L (ref 20–29)
CREATININE: 1.38 mg/dL — AB (ref 0.57–1.00)
Calcium: 9 mg/dL (ref 8.7–10.3)
GFR calc Af Amer: 41 mL/min/{1.73_m2} — ABNORMAL LOW (ref >59)
GFR calc non Af Amer: 36 mL/min/{1.73_m2} — ABNORMAL LOW (ref >59)
GLUCOSE: 96 mg/dL (ref 65–99)
Potassium: 3.8 mmol/L (ref 3.5–5.2)
SODIUM: 141 mmol/L (ref 134–144)

## 2018-05-27 LAB — VITAMIN D 25 HYDROXY (VIT D DEFICIENCY, FRACTURES): Vit D, 25-Hydroxy: 57 ng/mL (ref 30.0–100.0)

## 2018-05-28 ENCOUNTER — Telehealth: Payer: Self-pay | Admitting: *Deleted

## 2018-05-28 ENCOUNTER — Inpatient Hospital Stay (HOSPITAL_BASED_OUTPATIENT_CLINIC_OR_DEPARTMENT_OTHER): Payer: PPO | Admitting: Internal Medicine

## 2018-05-28 ENCOUNTER — Encounter (HOSPITAL_COMMUNITY): Payer: Self-pay | Admitting: Internal Medicine

## 2018-05-28 ENCOUNTER — Other Ambulatory Visit: Payer: Self-pay

## 2018-05-28 VITALS — BP 185/59 | HR 55 | Temp 97.6°F | Resp 16 | Wt 127.4 lb

## 2018-05-28 DIAGNOSIS — D61818 Other pancytopenia: Secondary | ICD-10-CM

## 2018-05-28 DIAGNOSIS — Z79899 Other long term (current) drug therapy: Secondary | ICD-10-CM

## 2018-05-28 DIAGNOSIS — I10 Essential (primary) hypertension: Secondary | ICD-10-CM

## 2018-05-28 DIAGNOSIS — M8580 Other specified disorders of bone density and structure, unspecified site: Secondary | ICD-10-CM

## 2018-05-28 DIAGNOSIS — Z79811 Long term (current) use of aromatase inhibitors: Secondary | ICD-10-CM | POA: Diagnosis not present

## 2018-05-28 DIAGNOSIS — C50511 Malignant neoplasm of lower-outer quadrant of right female breast: Secondary | ICD-10-CM

## 2018-05-28 DIAGNOSIS — Z170 Estrogen receptor positive status [ER+]: Secondary | ICD-10-CM | POA: Diagnosis not present

## 2018-05-28 DIAGNOSIS — D696 Thrombocytopenia, unspecified: Secondary | ICD-10-CM | POA: Diagnosis not present

## 2018-05-28 MED ORDER — LETROZOLE 2.5 MG PO TABS: 2.5000 mg | ORAL_TABLET | Freq: Every day | ORAL | 4 refills | Status: DC

## 2018-05-28 NOTE — Telephone Encounter (Signed)
Pt notified of results Verbalizes understanding 

## 2018-05-28 NOTE — Telephone Encounter (Signed)
-----   Message from Chipper Herb, MD sent at 05/27/2018  6:26 PM EDT ----- The blood sugar is good at 96.  The creatinine, the most important kidney function test remains elevated but slightly lower than it was previously and is now 1.38.  This is still elevated.  All of the electrolytes including potassium are good. The white blood cell count is more elevated than previously and is now 1.8.  The hemoglobin remains decreased at 10.8.  Platelet count is more decreased at 86,000. Cholesterol numbers were traditional lipid testing are all good and at goal with an LDL C being 72 and good cholesterol being good at 50 and triglycerides being good at 88.----- should continue with her atorvastatin and with as aggressive therapeutic lifestyle changes as possible including diet and exercise Vitamin D level is good at 57 and similar to past readings and she should continue with any current vitamin D treatment. All liver function tests are within normal limits Send a hard copy of this report to Dr. Posey Pronto in case he is not able to see this on the computer

## 2018-05-28 NOTE — Progress Notes (Signed)
Diagnosis Malignant neoplasm of lower-outer quadrant of right breast of female, estrogen receptor positive ([place]) - Plan: CBC with Differential/Platelet, Comprehensive metabolic panel, Lactate dehydrogenase, Protime-INR, APTT  Pancytopenia (HCC) - Plan: CBC with Differential/Platelet, Comprehensive metabolic panel, Lactate dehydrogenase, Protime-INR, APTT  Staging Cancer Staging Malignant neoplasm of lower-outer quadrant of right breast of female, estrogen receptor positive ([place]) Staging form: Breast, AJCC 8th Edition - Clinical stage from 02/22/2018: Stage IA (cT1c, cN0, cM0, G1, ER+, PR+, HER2-) - Signed by Truitt Merle, MD on 03/02/2018   Assessment and Plan:  1.  Leukopenia.  Labs done 03/03/2018 reviewed with pt and showed WBC 2.7, HB 10.7 plts 136,000.    Labs done 05/26/2018 reviewed and showed WBC 1.8 HB 10.8 plts 86,000.  Smear shows no significant abnormalities.  Chemistries WNL with K+ 3.8, Cr 1.38 and normal LFTs.    Counts have worsened compared to labs done 03/03/2018 when wBC was 2.7 and plts 136,000.  She reports she is no longer taking fish oil or ASA.  Pt will RTC in 07/2018 for follow-up and repeat labs.  If counts continue to worsen, she will be given option of bone marrow biopsy for definitive diagnosis due to leukopenia and thrombocytopenia  2.  Breast cancer.  Pt had screening mammogram done 02/12/2018 that showed mass in the right breast that was indeterminate.  She denied feeling any abnormalities in the breast.  She subsequently had right breast diagnostic mammogram with USN done 02/16/2018 that showed 1.1 cm mass in the right breast at the 8:00 position suggestive of malignancy.  She also had a 0.5 cm intraductal mass in the right breast at the 11:00 position suspicious for malignancy.  Biopsy was recommended.    The patient underwent biopsy of the 2 areas of concern on 02/22/2018 with pathology from the right breast core biopsy at the 8 o'oclock returning as Invasive and in situ  ductal carcinoma.  Core biopsy of the 11 o'clock position returned as fibrocystic changes.  The invasive component was Grade 1.  Endocrine panel showed tumor was ER+ 100%, PR + 40%, Her 2 negative by FISH.    She was seen by Dr. Donne Hazel and underwent surgery on 04/08/2018 with pathology returning as 1. Breast, lumpectomy, Right w/seed - INVASIVE DUCTAL CARCINOMA WITH EXTRACELLULAR MUCIN, NOTTINGHAM GRADE 1 OF 3, 1.1 CM - DUCTAL CARCINOMA IN SITU, INTERMEDIATE NUCLEAR GRADE - MARGINS UNINVOLVED BY CARCINOMA (0.1 CM; POSTERIOR MARGIN) - FIBROCYSTIC CHANGES CHANGES INCLUDING APOCRINE METAPLASIA - PREVIOUS BIOPSY SITE CHANGES - SEE ONCOLOGY TABLE AND COMMENT BELOW 2. Breast, excision, right inferior margin - NO RESIDUAL CARCINOMA IDENTIFIED 3. Breast, excision, right lateral margin - NO RESIDUAL CARCINOMA IDENTIFIED 4. Breast, excision, right medial margin - NO RESIDUAL CARCINOMA IDENTIFIED  Pt was seen by Dr. Burr Medico and Dr. Isidore Moos on 03/03/2018 and was recommended for adjuvant RT and endocrine therapy.  Pt did not desire to take RT.    She has not begun Endocrine therapy and reports she desires to have breast cancer follow-up at [facility].    I have discussed with her she has evidence of early stage HR+ right breast cancer.  She was not recommended for oncotype evaluation.    I have discussed with her options of therapy with Femara 2.5 mg daily with initial goal of 5 years.  I have discussed use of AI to reduce risk of breast cancer recurrence.  Side effects of the medication were reviewed and include but are not Limited to hot flashes, bone and joint discomfort  and osteoporosis.  Pt had Dexa done 12/25/2016 that showed osteopenia.  She will be set up for repeat BMD in 12/2018.  Pt is recommended for Calcium and vitamin D.  Pt was provided written Information regarding Femara and Rx for Femara 2.5 mg po daily # 90 with 4 refills was sent to pharmacy.  She will RTC in 07/2018 for follow-up and repeat  labs  to assess tolerance of therapy.  All questions answered and pt expressed understanding of the information presented.    3.  Thrombocytopenia.  Plt count has decreased to 86,000.  No fragmentation noted.  She will RTC in 07/2018 for repeat labs and will check LDH, coagulation studies at that time.  Pt is no longer on ASA.   4.  Easy bleeding and bruising.  She denies any family history of bleeding disorders or bleeding with procedures.  Pt had Coagulation studies done 01/29/2018 showed Normal PT 13.5 with PTT of 40.  ANA and VWF profile were normal.  Pt reports she is now holding ASA and fish oil.  Work up has been negative for common bleeding disorders.  She denies any significant change in symptoms.  Will repeat labs in 07/2018.    5  HTN. BP is 185/59.  Follow-up with PCP for management.    Greater than 25 minutes spent with more than 50% spent in counseling and coordination of care.    Interval History: Historical data obtained from note dated 01/29/2018.  84 yr old female who was last seen at [facility] in 2018 for Leukopenia and thrombocytopenia.  Pt did not keep her follow-up.    Current Status:  Pt is seen today for follow-up.  She is here to go over recent labs.  She desires to have breast cancer treatment recommended by [facility].    Oncology History   Cancer Staging Malignant neoplasm of lower-outer quadrant of right breast of female, estrogen receptor positive ([place]) Staging form: Breast, AJCC 8th Edition - Clinical stage from 02/22/2018: Stage IA (cT1c, cN0, cM0, G1, ER+, PR+, HER2-) - Signed by Truitt Merle, MD on 03/02/2018      Malignant neoplasm of lower-outer quadrant of right breast of female, estrogen receptor positive ([place])   02/12/2018 Mammogram    She had routine screening bilateral mammography on 02/12/2018 at [facility] with results showing: indeterminate irregular mass in the right breast.     02/16/2018 Mammogram    She underwent right diagnostic mammography with tomography and right  breast ultrasonography at [facility] on 02/16/2018 showing: 1.1 cm irregular mass at the 8 O'clock position on the right breast    02/22/2018 Pathology Results    Diagnosis 1. Breast, right, needle core biopsy, lateral, 8 o'clock - INVASIVE AND IN SITU DUCTAL CARCINOMA. - SEE COMMENT. 2. Breast, right, needle core biopsy, 11 o'clock - FIBROCYSTIC CHANGES. - USUAL DUCTAL HYPERPLASIA. - THERE IS NO EVIDENCE OF MALIGNANCY.    02/22/2018 Receptors her2    IMMUNOHISTOCHEMICAL AND MORPHOMETRIC ANALYSIS PERFORMED MANUALLY Estrogen Receptor: 100%, POSITIVE, STRONG STAINING INTENSITY Progesterone Receptor: 40%, POSITIVE, MODERATE STAINING INTENSITY Proliferation Marker Ki67: 1% Her2 Negative    02/22/2018 Cancer Staging    Staging form: Breast, AJCC 8th Edition - Clinical stage from 02/22/2018: Stage IA (cT1c, cN0, cM0, G1, ER+, PR+, HER2-) - Signed by Truitt Merle, MD on 03/02/2018     02/25/2018 Initial Diagnosis    Malignant neoplasm of lower-outer quadrant of right breast of female, estrogen receptor positive ([place])      Problem List Patient Active  Problem List   Diagnosis Date Noted  . Breast cancer, right ([place]) [C50.911] 04/08/2018  . Malignant neoplasm of lower-outer quadrant of right breast of female, estrogen receptor positive ([place]) [C50.511, Z17.0] 02/25/2018  . Hematoma [T14.8XXA] 11/27/2016  . Severe epistaxis [R04.0] 11/23/2016  . Vertigo [R42] 09/09/2016  . Renal vascular disease [N28.89] 10/31/2015  . Nodule of right lung [R91.1] 10/31/2015  . Asthma without status asthmaticus [J45.909] 10/31/2015  . Essential hypertension, malignant [I10]   . Hypothyroidism [E03.9] 10/15/2015  . Pancytopenia ([place]) [O03.559] 10/15/2015  . Hyponatremia [E87.1] 10/14/2015  . Thrombocytopenia ([place]) [D69.6] 06/16/2015  . Right renal artery stenosis ([place]) [I70.1] 05/09/2015  . Chronic diastolic heart failure ([place]) [I50.32] 04/03/2014  . Hypertensive urgency [I16.0] 04/02/2014  . Headache [R51] 04/02/2014   . CKD (chronic kidney disease) stage 3, GFR 30-59 ml/min (HCC) [N18.3] 04/02/2014  . Paroxysmal atrial fibrillation ([place]) [I48.0] 01/25/2014  . Hypokalemia [E87.6] 07/19/2013  . Anemia [D64.9] 07/19/2013  . Bilateral lower extremity edema [R60.0] 06/30/2013  . Hypertension with fluid overload [I10, E87.79] 06/30/2013  . Pedal edema [R60.0] 05/10/2013  . Varicose veins of lower extremities with other complications [R41.638] 45/36/4680  . ABNORMAL STRESS ELECTROCARDIOGRAM [R94.31] 01/23/2010  . Hyperlipidemia [E78.5] 12/18/2009  . Essential hypertension [I10] 12/18/2009  . RENAL INSUFFICIENCY, CHRONIC [N18.9] 12/18/2009  . Arthropathy [M12.9] 12/18/2009    Past Medical History Past Medical History:  Diagnosis Date  . Anemia   . Arthritis   . Asthma   . Atrial fibrillation ([place])   . CKD (chronic kidney disease), stage III ([place])   . Heart murmur   . Hyperlipidemia   . Hypertension   . Hypothyroidism   . Nodule of right lung    Right upper lobe  . PONV (postoperative nausea and vomiting)   . Renal insufficiency    Chronic  . Renal vascular disease   . Right renal artery stenosis ([place]) 05/09/2015  . Vertigo     Past Surgical History Past Surgical History:  Procedure Laterality Date  . BREAST LUMPECTOMY WITH RADIOACTIVE SEED LOCALIZATION Right 04/08/2018   Procedure: BREAST LUMPECTOMY WITH RADIOACTIVE SEED LOCALIZATION;  Surgeon: Rolm Bookbinder, MD;  Location: [facility];  Service: General;  Laterality: Right;  . IR GENERIC HISTORICAL  08/29/2016   IR US GUIDE VASC ACCESS RIGHT 08/29/2016 [facility]INTER[facility] RAD  . IR GENERIC HISTORICAL  08/29/2016   IR RENAL BILAT S&I MOD SED 08/29/2016 [facility]INTER[facility]  . KNEE ARTHROSCOPY     2019  . THYROIDECTOMY, PARTIAL Right 1981   middle lobe removed 1st, then right lobe removed 7-8 years later (approx. 65)    Family History Family History  Problem Relation Age of Onset  . Thyroid disease Mother   . Stroke Father   . Hypertension Brother   .  Lung cancer Maternal Uncle   . Cancer Maternal Grandmother        Liver  . Lung cancer Maternal Uncle   . Hypertension Daughter   . Hypercholesterolemia Daughter   . Hypercholesterolemia Daughter      Social History  reports that she has never smoked. She has never used smokeless tobacco. She reports that she does not drink alcohol or use drugs.  Medications  Current Outpatient Medications:  .  acetaminophen (TYLENOL) 325 MG tablet, Take 325 mg by mouth daily as needed for moderate pain or headache., Disp: , Rfl:  .  atenolol-chlorthalidone (TENORETIC) 50-25 MG tablet, TAKE 1 TABLET BY MOUTH DAILY., Disp: 90 tablet, Rfl: 1 .  atorvastatin (LIPITOR)  20 MG tablet, TAKE 1/2 TABLET DAILY, Disp: 45 tablet, Rfl: 0 .  calcium-vitamin D (OSCAL WITH D) 500-200 MG-UNIT per tablet, Take 1 tablet by mouth at bedtime. , Disp: , Rfl:  .  cholecalciferol (VITAMIN D) 1000 units tablet, Take 1,000 Units by mouth daily., Disp: , Rfl:  .  ferrous sulfate 325 (65 FE) MG tablet, Take 325 mg by mouth 2 (two) times daily with a meal., Disp: , Rfl:  .  flecainide (TAMBOCOR) 50 MG tablet, TAKE 1 TABLET (50 MG TOTAL) BY MOUTH 2 (TWO) TIMES DAILY., Disp: 180 tablet, Rfl: 60 .  fluticasone (FLONASE) 50 MCG/ACT nasal spray, Place 1 spray into both nostrils 2 (two) times daily. , Disp: , Rfl:  .  hydrALAZINE (APRESOLINE) 100 MG tablet, Take 100 mg by mouth 2 (two) times daily., Disp: , Rfl:  .  hydroxypropyl methylcellulose / hypromellose (ISOPTO TEARS / GONIOVISC) 2.5 % ophthalmic solution, Place 1 drop into both eyes daily. , Disp: , Rfl:  .  levothyroxine (SYNTHROID, LEVOTHROID) 75 MCG tablet, TAKE 1 TABLET (75 MCG TOTAL) BY MOUTH DAILY., Disp: 90 tablet, Rfl: 3 .  letrozole (FEMARA) 2.5 MG tablet, Take 1 tablet (2.5 mg total) by mouth daily., Disp: 90 tablet, Rfl: 4 .  lisinopril (PRINIVIL,ZESTRIL) 40 MG tablet, Take 20 mg by mouth daily. , Disp: , Rfl:   Allergies Amlodipine; Isosorbide nitrate; Sulfonamide  derivatives; and Terazosin  Review of Systems Review of Systems - Oncology ROS negative   Physical Exam  Vitals Wt Readings from Last 3 Encounters:  05/28/18 127 lb 6.4 oz (57.8 kg)  05/05/18 126 lb (57.2 kg)  04/08/18 125 lb 10.6 oz (57 kg)   Temp Readings from Last 3 Encounters:  05/28/18 97.6 F (36.4 C) (Oral)  04/09/18 97.6 F (36.4 C) (Oral)  04/02/18 97.8 F (36.6 C)   BP Readings from Last 3 Encounters:  05/28/18 (!) 185/59  05/05/18 (!) 159/61  04/09/18 (!) 163/51   Pulse Readings from Last 3 Encounters:  05/28/18 (!) 55  05/05/18 (!) 49  04/09/18 (!) 49    Constitutional: Well-developed, well-nourished, and in no distress.   HENT: Head: Normocephalic and atraumatic.  Mouth/Throat: No oropharyngeal exudate. Mucosa moist. Eyes: Pupils are equal, round, and reactive to light. Conjunctivae are normal. No scleral icterus.  Neck: Normal range of motion. Neck supple. No JVD present.  Cardiovascular: Normal rate, regular rhythm and normal heart sounds.  Exam reveals no gallop and no friction rub.   No murmur heard. Pulmonary/Chest: Effort normal and breath sounds normal. No respiratory distress. No wheezes.No rales.  Abdominal: Soft. Bowel sounds are normal. No distension. There is no tenderness. There is no guarding.  Musculoskeletal: No edema or tenderness.  Lymphadenopathy: No cervical, axillary or supraclavicular adenopathy.  Neurological: Alert and oriented to person, place, and time. No cranial nerve deficit.  Skin: Skin is warm and dry. No rash noted. No erythema. No pallor.  Psychiatric: Affect and judgment normal.  Breast exam:  Chaperone present.  Right lumpectomy changes noted.  No dominant masses palpable bilaterally.    Labs Lab on 05/26/2018  Component Date Value Ref Range Status  . Glucose 05/26/2018 96  65 - 99 mg/dL Final  . BUN 05/26/2018 26  8 - 27 mg/dL Final  . Creatinine, Ser 05/26/2018 1.38* 0.57 - 1.00 mg/dL Final  . GFR calc non Af  Amer 05/26/2018 36* >59 mL/min/1.73 Final  . GFR calc Af Amer 05/26/2018 41* >59 mL/min/1.73 Final  . BUN/Creatinine Ratio 05/26/2018  19  12 - 28 Final  . Sodium 05/26/2018 141  134 - 144 mmol/L Final  . Potassium 05/26/2018 3.8  3.5 - 5.2 mmol/L Final  . Chloride 05/26/2018 98  96 - 106 mmol/L Final  . CO2 05/26/2018 27  20 - 29 mmol/L Final  . Calcium 05/26/2018 9.0  8.7 - 10.3 mg/dL Final  . WBC 05/26/2018 1.8* 3.4 - 10.8 x10E3/uL Final  . RBC 05/26/2018 3.74* 3.77 - 5.28 x10E6/uL Final  . Hemoglobin 05/26/2018 10.8* 11.1 - 15.9 g/dL Final  . Hematocrit 05/26/2018 32.1* 34.0 - 46.6 % Final  . MCV 05/26/2018 86  79 - 97 fL Final  . MCH 05/26/2018 28.9  26.6 - 33.0 pg Final  . MCHC 05/26/2018 33.6  31.5 - 35.7 g/dL Final  . RDW 05/26/2018 12.8  12.3 - 15.4 % Final  . Platelets 05/26/2018 86* 150 - 450 x10E3/uL Final   Comment: Actual platelet count may be somewhat higher than reported due to aggregation of platelets in this sample.   . Neutrophils 05/26/2018 53  Not Estab. % Final  . Lymphs 05/26/2018 33  Not Estab. % Final  . Monocytes 05/26/2018 12  Not Estab. % Final  . Eos 05/26/2018 1  Not Estab. % Final  . Basos 05/26/2018 1  Not Estab. % Final  . Neutrophils Absolute 05/26/2018 1.0* 1.4 - 7.0 x10E3/uL Final  . Lymphocytes Absolute 05/26/2018 0.6* 0.7 - 3.1 x10E3/uL Final  . Monocytes Absolute 05/26/2018 0.2  0.1 - 0.9 x10E3/uL Final  . EOS (ABSOLUTE) 05/26/2018 0.0  0.0 - 0.4 x10E3/uL Final  . Basophils Absolute 05/26/2018 0.0  0.0 - 0.2 x10E3/uL Final  . Immature Granulocytes 05/26/2018 0  Not Estab. % Final  . Immature Grans (Abs) 05/26/2018 0.0  0.0 - 0.1 x10E3/uL Final  . Hematology Comments: 05/26/2018 Note:   Final   Verified by microscopic examination.  . Cholesterol, Total 05/26/2018 140  100 - 199 mg/dL Final  . Triglycerides 05/26/2018 88  0 - 149 mg/dL Final  . HDL 05/26/2018 50  >39 mg/dL Final  . VLDL Cholesterol Cal 05/26/2018 18  5 - 40 mg/dL Final  . LDL  Calculated 05/26/2018 72  0 - 99 mg/dL Final  . Chol/HDL Ratio 05/26/2018 2.8  0.0 - 4.4 ratio Final   Comment:                                   T. Chol/HDL Ratio                                             Men  Women                               1/2 Avg.Risk  3.4    3.3                                   Avg.Risk  5.0    4.4                                2X Avg.Risk  9.6    7.1  3X Avg.Risk 23.4   11.0   . Vit D, 25-Hydroxy 05/26/2018 57.0  30.0 - 100.0 ng/mL Final   Comment: Vitamin D deficiency has been defined by the [place] practice guideline as a level of serum 25-OH vitamin D less than 20 ng/mL (1,2). The Endocrine Society went on to further define vitamin D insufficiency as a level between 21 and 29 ng/mL (2). 1. IOM (Institute of Medicine). 2010. Dietary reference    intakes for calcium and D. [place]: The    [employer]. 2. Holick MF, Binkley Castro, Bischoff-Ferrari HA, et al.    Evaluation, treatment, and prevention of vitamin D    deficiency: an Endocrine Society clinical practice    guideline. JCEM. 2011 Jul; 96(7):1911-30.   [place] Kitchen Total Protein 05/26/2018 6.7  6.0 - 8.5 g/dL Final  . Albumin 05/26/2018 4.1  3.5 - 4.7 g/dL Final  . Bilirubin Total 05/26/2018 0.4  0.0 - 1.2 mg/dL Final  . Bilirubin, Direct 05/26/2018 0.10  0.00 - 0.40 mg/dL Final  . Alkaline Phosphatase 05/26/2018 65  39 - 117 IU/L Final  . AST 05/26/2018 21  0 - 40 IU/L Final  . ALT 05/26/2018 12  0 - 32 IU/L Final     Pathology Orders Placed This Encounter  Procedures  . CBC with Differential/Platelet    Standing Status:   Future    Standing Expiration Date:   05/29/2019  . Comprehensive metabolic panel    Standing Status:   Future    Standing Expiration Date:   05/29/2019  . Lactate dehydrogenase    Standing Status:   Future    Standing Expiration Date:   05/29/2019  . Protime-INR    Standing Status:   Future     Standing Expiration Date:   05/29/2019  . APTT    Standing Status:   Future    Standing Expiration Date:   05/29/2019       Zoila Shutter MD

## 2018-05-28 NOTE — Progress Notes (Signed)
Pt notified of results Verbalizes understanding 

## 2018-06-03 ENCOUNTER — Encounter: Payer: Self-pay | Admitting: Family Medicine

## 2018-06-03 ENCOUNTER — Ambulatory Visit (INDEPENDENT_AMBULATORY_CARE_PROVIDER_SITE_OTHER): Payer: PPO | Admitting: Family Medicine

## 2018-06-03 VITALS — BP 178/76 | HR 51 | Temp 97.6°F | Ht 66.0 in | Wt 125.0 lb

## 2018-06-03 DIAGNOSIS — D696 Thrombocytopenia, unspecified: Secondary | ICD-10-CM

## 2018-06-03 DIAGNOSIS — I10 Essential (primary) hypertension: Secondary | ICD-10-CM

## 2018-06-03 DIAGNOSIS — E7800 Pure hypercholesterolemia, unspecified: Secondary | ICD-10-CM | POA: Diagnosis not present

## 2018-06-03 DIAGNOSIS — I480 Paroxysmal atrial fibrillation: Secondary | ICD-10-CM

## 2018-06-03 DIAGNOSIS — N183 Chronic kidney disease, stage 3 unspecified: Secondary | ICD-10-CM

## 2018-06-03 DIAGNOSIS — E559 Vitamin D deficiency, unspecified: Secondary | ICD-10-CM | POA: Diagnosis not present

## 2018-06-03 NOTE — Patient Instructions (Addendum)
Medicare Annual Wellness Visit  [facility] and the medical providers at [facility] strive to bring you the best medical care.  In doing so we not only want to address your current medical conditions and concerns but also to detect new conditions early and prevent illness, disease and health-related problems.    Medicare offers a yearly Wellness Visit which allows our clinical staff to assess your need for preventative services including immunizations, lifestyle education, counseling to decrease risk of preventable diseases and screening for fall risk and other medical concerns.    This visit is provided free of charge (no copay) for all Medicare recipients. The clinical pharmacists at [facility] have begun to conduct these Wellness Visits which will also include a thorough review of all your medications.    As you primary medical provider recommend that you make an appointment for your Annual Wellness Visit if you have not done so already this year.  You may set up this appointment before you leave today or you may call back ([phone]) and schedule an appointment.  Please make sure when you call that you mention that you are scheduling your Annual Wellness Visit with the clinical pharmacist so that the appointment may be made for the proper length of time.     Continue current medications. Continue good therapeutic lifestyle changes which include good diet and exercise. Fall precautions discussed with patient. If an FOBT was given today- please return it to our front desk. If you are over 71 years old - you may need Prevnar 57 or the adult Pneumonia vaccine.  **Flu shots are available--- please call and schedule a FLU-CLINIC appointment**  After your visit with [place] today you will receive a survey in the mail or online from [employer] regarding your care with [place]. Please take a moment to fill this out. Your feedback is very  important to [place] as you can help [place] better understand your patient needs as well as improve your experience and satisfaction. WE CARE ABOUT YOU!!!   Continue with physical therapy for the knee and increase walking as your knee gets better always being careful not to put yourself at risk for falling Discussed with Dr. Walden Field if you would benefit with Prolia or not Continue to drink plenty of fluids and monitor blood pressure readings at home and watch sodium intake Continue to record blood pressure readings for [place] and the nephrologist Continue with vitamin D replacement and calcium replacement as currently doing Moving forward with you taking the new medicine for your breast cancer we will continue to monitor the cholesterol closely Follow-up with hematology as planned

## 2018-06-03 NOTE — Progress Notes (Signed)
Subjective:    Patient ID: [name], female    DOB: 02/08/1934, 84 y.o.   MRN: [id number]  HPI Pt here for follow up and management of chronic medical problems which includes a fib, hyperlipidemia and hypertension. She is taking medication regularly.  The patient is doing well with no specific complaints.  I have been following her on the computer and she has seen the hematologist recently with an even lower white count at around 1.8.  The hematologist is aware of this.  She does not need any refills today.  Her blood pressure is high initially today and she does bring in readings from home and these will be scanned into the record.  All of the home readings look good.  Ranging from the 115 level up to the upper 130 level.  This is for the systolic.  The diastolics are running in the upper 40s to 60 range.  The patient has had recent lab work.  All of her cholesterol numbers were excellent with an LDL C being 72 and the good cholesterol being good at 50 and triglycerides good at 88.  The blood sugar was good at 96.  The creatinine seems to be staying stable with a creatinine at about 1.38.  All of the electrolytes including potassium are good.  The white blood cell count was low at 1.8.  Hemoglobin is stable at 10.8.  The platelet count was also low at 86,000.  Vitamin D level was good at 57 and stable and all of the liver function tests were normal.  These results will be reviewed with her during the visit today.  The patient today denies any chest pain or shortness of breath anymore than usual.  Because of her knee surgery she is walking less and hopefully will begin to walk more.  She denies any trouble with nausea vomiting diarrhea blood in the stool or black tarry bowel movements.  She is passing her water without problems.  She does have a follow-up visit with the hematologist in early December who will follow up on the low platelet count and low white blood cell count.     Patient Active  Problem List   Diagnosis Date Noted  . Breast cancer, right ([place]) 04/08/2018  . Malignant neoplasm of lower-outer quadrant of right breast of female, estrogen receptor positive ([facility]) 02/25/2018  . Hematoma 11/27/2016  . Severe epistaxis 11/23/2016  . Vertigo 09/09/2016  . Renal vascular disease 10/31/2015  . Nodule of right lung 10/31/2015  . Asthma without status asthmaticus 10/31/2015  . Essential hypertension, malignant   . Hypothyroidism 10/15/2015  . Pancytopenia ([place]) 10/15/2015  . Hyponatremia 10/14/2015  . Thrombocytopenia ([place]) 06/16/2015  . Right renal artery stenosis ([place]) 05/09/2015  . Chronic diastolic heart failure ([place]) 04/03/2014  . Hypertensive urgency 04/02/2014  . Headache 04/02/2014  . CKD (chronic kidney disease) stage 3, GFR 30-59 ml/min (HCC) 04/02/2014  . Paroxysmal atrial fibrillation ([place]) 01/25/2014  . Hypokalemia 07/19/2013  . Anemia 07/19/2013  . Bilateral lower extremity edema 06/30/2013  . Hypertension with fluid overload 06/30/2013  . Pedal edema 05/10/2013  . Varicose veins of lower extremities with other complications 31/54/0086  . ABNORMAL STRESS ELECTROCARDIOGRAM 01/23/2010  . Hyperlipidemia 12/18/2009  . Essential hypertension 12/18/2009  . RENAL INSUFFICIENCY, CHRONIC 12/18/2009  . Arthropathy 12/18/2009   Outpatient Encounter Medications as of 06/03/2018  Medication Sig  . acetaminophen (TYLENOL) 325 MG tablet Take 325 mg by mouth daily as needed for moderate pain or  headache.  Marland Kitchen atenolol-chlorthalidone (TENORETIC) 50-25 MG tablet TAKE 1 TABLET BY MOUTH DAILY.  Marland Kitchen atorvastatin (LIPITOR) 20 MG tablet TAKE 1/2 TABLET DAILY  . calcium-vitamin D (OSCAL WITH D) 500-200 MG-UNIT per tablet Take 1 tablet by mouth at bedtime.   . cholecalciferol (VITAMIN D) 1000 units tablet Take 1,000 Units by mouth daily.  . ferrous sulfate 325 (65 FE) MG tablet Take 325 mg by mouth 2 (two) times daily with a meal.  . flecainide (TAMBOCOR) 50 MG tablet TAKE 1  TABLET (50 MG TOTAL) BY MOUTH 2 (TWO) TIMES DAILY.  . fluticasone (FLONASE) 50 MCG/ACT nasal spray Place 1 spray into both nostrils 2 (two) times daily.   . hydrALAZINE (APRESOLINE) 100 MG tablet Take 100 mg by mouth 2 (two) times daily.  . hydroxypropyl methylcellulose / hypromellose (ISOPTO TEARS / GONIOVISC) 2.5 % ophthalmic solution Place 1 drop into both eyes daily.   Marland Kitchen letrozole (FEMARA) 2.5 MG tablet Take 1 tablet (2.5 mg total) by mouth daily.  Marland Kitchen levothyroxine (SYNTHROID, LEVOTHROID) 75 MCG tablet TAKE 1 TABLET (75 MCG TOTAL) BY MOUTH DAILY.  . [DISCONTINUED] lisinopril (PRINIVIL,ZESTRIL) 40 MG tablet Take 20 mg by mouth daily.    No facility-administered encounter medications on file as of 06/03/2018.      Review of Systems  Constitutional: Negative.   HENT: Negative.   Eyes: Negative.   Respiratory: Negative.   Cardiovascular: Negative.   Gastrointestinal: Negative.   Endocrine: Negative.   Genitourinary: Negative.   Musculoskeletal: Negative.   Skin: Negative.   Allergic/Immunologic: Negative.   Neurological: Negative.   Hematological: Negative.   Psychiatric/Behavioral: Negative.        Objective:   Physical Exam  Constitutional: She is oriented to person, place, and time. She appears well-developed and well-nourished. No distress.  The patient is calm pleasant and relaxed and is used to having high blood pressure readings in the office with normal blood pressure readings at home.  The nephrologist, Dr. Posey Pronto also understands this.  HENT:  Head: Normocephalic and atraumatic.  Right Ear: External ear normal.  Left Ear: External ear normal.  Nose: Nose normal.  Mouth/Throat: Oropharynx is clear and moist. No oropharyngeal exudate.  Eyes: Pupils are equal, round, and reactive to light. Conjunctivae and EOM are normal. Right eye exhibits no discharge. Left eye exhibits no discharge. No scleral icterus.  Neck: Normal range of motion. Neck supple. No thyromegaly present.   Bilateral carotid bruits most likely coming from our result of the systolic ejection murmur in her heart.  No adenopathy or thyromegaly  Cardiovascular: Normal rate, regular rhythm and intact distal pulses.  Murmur heard. Heart is 60/min with a grade 3/6 systolic ejection murmur.  There are good pedal pulses and no edema.  Pulmonary/Chest: Effort normal and breath sounds normal. She has no wheezes. She has no rales.  Clear anteriorly and posteriorly  Abdominal: Soft. Bowel sounds are normal. She exhibits no mass. There is no tenderness.  No liver or spleen enlargement.  No epigastric tenderness.  No inguinal adenopathy and no abdominal bruits.  Musculoskeletal: Normal range of motion. She exhibits no edema.  Recovering from right knee surgery and doing better with this no signs of any swelling or inflammation and fairly good mobility.  Lymphadenopathy:    She has no cervical adenopathy.  Neurological: She is alert and oriented to person, place, and time. She has normal reflexes. No cranial nerve deficit.  Reflexes are 2+ and equal bilaterally  Skin: Skin is warm and dry.  No rash noted.  Psychiatric: She has a normal mood and affect. Her behavior is normal. Judgment and thought content normal.  Mood affect and behavior are all normal for this patient  Nursing note and vitals reviewed.  BP (!) 179/66 (BP Location: Left Arm)   Pulse (!) 51   Temp 97.6 F (36.4 C) (Oral)   Ht 5\' 6"  (1.676 m)   Wt 125 lb (56.7 kg)   BMI 20.18 kg/m         Assessment & Plan:  1. Pure hypercholesterolemia -Cholesterol numbers are good.  And she will continue with her current treatment regimen  2. Paroxysmal atrial fibrillation (HCC) -The heart today was regular at 60/min  3. Vitamin D deficiency -Continue with current vitamin D replacement  4. Thrombocytopenia ([place]) -Follow-up with hematology as planned  5. CKD (chronic kidney disease) stage 3, GFR 30-59 ml/min (HCC) -Follow-up with Dr. Posey Pronto  as planned and make sure that you take a copy of the recent lab work with you to the next visit to see him  6. Essential hypertension -Continue monitoring blood pressures at home and take these with you to your doctor's visits   Patient Instructions                       Medicare Annual Wellness Visit  [facility] and the medical providers at [facility] strive to bring you the best medical care.  In doing so we not only want to address your current medical conditions and concerns but also to detect new conditions early and prevent illness, disease and health-related problems.    Medicare offers a yearly Wellness Visit which allows our clinical staff to assess your need for preventative services including immunizations, lifestyle education, counseling to decrease risk of preventable diseases and screening for fall risk and other medical concerns.    This visit is provided free of charge (no copay) for all Medicare recipients. The clinical pharmacists at [facility] have begun to conduct these Wellness Visits which will also include a thorough review of all your medications.    As you primary medical provider recommend that you make an appointment for your Annual Wellness Visit if you have not done so already this year.  You may set up this appointment before you leave today or you may call back [phone]) and schedule an appointment.  Please make sure when you call that you mention that you are scheduling your Annual Wellness Visit with the clinical pharmacist so that the appointment may be made for the proper length of time.     Continue current medications. Continue good therapeutic lifestyle changes which include good diet and exercise. Fall precautions discussed with patient. If an FOBT was given today- please return it to our front desk. If you are over 40 years old - you may need Prevnar 9 or the adult Pneumonia vaccine.  **Flu shots are  available--- please call and schedule a FLU-CLINIC appointment**  After your visit with [place] today you will receive a survey in the mail or online from [employer] regarding your care with [place]. Please take a moment to fill this out. Your feedback is very important to [place] as you can help [place] better understand your patient needs as well as improve your experience and satisfaction. WE CARE ABOUT YOU!!!   Continue with physical therapy for the knee and increase walking as your knee gets better always being careful not to put yourself  at risk for falling Discussed with Dr. Walden Field if you would benefit with Prolia or not Continue to drink plenty of fluids and monitor blood pressure readings at home and watch sodium intake Continue to record blood pressure readings for [place] and the nephrologist Continue with vitamin D replacement and calcium replacement as currently doing Moving forward with you taking the new medicine for your breast cancer we will continue to monitor the cholesterol closely Follow-up with hematology as planned  Arrie Senate MD

## 2018-06-04 ENCOUNTER — Ambulatory Visit (INDEPENDENT_AMBULATORY_CARE_PROVIDER_SITE_OTHER): Payer: PPO

## 2018-06-04 DIAGNOSIS — Z23 Encounter for immunization: Secondary | ICD-10-CM

## 2018-06-16 ENCOUNTER — Other Ambulatory Visit: Payer: PPO

## 2018-06-16 DIAGNOSIS — Z1211 Encounter for screening for malignant neoplasm of colon: Secondary | ICD-10-CM | POA: Diagnosis not present

## 2018-06-18 LAB — FECAL OCCULT BLOOD, IMMUNOCHEMICAL: Fecal Occult Bld: NEGATIVE

## 2018-07-13 ENCOUNTER — Inpatient Hospital Stay (HOSPITAL_COMMUNITY): Payer: PPO | Attending: Hematology

## 2018-07-13 DIAGNOSIS — C50511 Malignant neoplasm of lower-outer quadrant of right female breast: Secondary | ICD-10-CM | POA: Diagnosis not present

## 2018-07-13 DIAGNOSIS — Z79811 Long term (current) use of aromatase inhibitors: Secondary | ICD-10-CM | POA: Insufficient documentation

## 2018-07-13 DIAGNOSIS — Z170 Estrogen receptor positive status [ER+]: Secondary | ICD-10-CM | POA: Insufficient documentation

## 2018-07-13 DIAGNOSIS — I10 Essential (primary) hypertension: Secondary | ICD-10-CM | POA: Insufficient documentation

## 2018-07-13 DIAGNOSIS — D61818 Other pancytopenia: Secondary | ICD-10-CM

## 2018-07-13 DIAGNOSIS — D696 Thrombocytopenia, unspecified: Secondary | ICD-10-CM | POA: Insufficient documentation

## 2018-07-13 DIAGNOSIS — Z79899 Other long term (current) drug therapy: Secondary | ICD-10-CM | POA: Diagnosis not present

## 2018-07-13 DIAGNOSIS — M8580 Other specified disorders of bone density and structure, unspecified site: Secondary | ICD-10-CM | POA: Diagnosis not present

## 2018-07-13 LAB — CBC WITH DIFFERENTIAL/PLATELET
ABS IMMATURE GRANULOCYTES: 0 10*3/uL (ref 0.00–0.07)
Basophils Absolute: 0 10*3/uL (ref 0.0–0.1)
Basophils Relative: 0 %
Eosinophils Absolute: 0 10*3/uL (ref 0.0–0.5)
Eosinophils Relative: 0 %
HEMATOCRIT: 33.5 % — AB (ref 36.0–46.0)
HEMOGLOBIN: 10.6 g/dL — AB (ref 12.0–15.0)
IMMATURE GRANULOCYTES: 0 %
LYMPHS ABS: 0.6 10*3/uL — AB (ref 0.7–4.0)
Lymphocytes Relative: 22 %
MCH: 28 pg (ref 26.0–34.0)
MCHC: 31.6 g/dL (ref 30.0–36.0)
MCV: 88.4 fL (ref 80.0–100.0)
MONO ABS: 0.3 10*3/uL (ref 0.1–1.0)
Monocytes Relative: 11 %
NEUTROS ABS: 1.8 10*3/uL (ref 1.7–7.7)
NEUTROS PCT: 67 %
PLATELETS: 123 10*3/uL — AB (ref 150–400)
RBC: 3.79 MIL/uL — ABNORMAL LOW (ref 3.87–5.11)
RDW: 13.5 % (ref 11.5–15.5)
WBC: 2.7 10*3/uL — ABNORMAL LOW (ref 4.0–10.5)
nRBC: 0 % (ref 0.0–0.2)

## 2018-07-13 LAB — COMPREHENSIVE METABOLIC PANEL
ALK PHOS: 62 U/L (ref 38–126)
ALT: 16 U/L (ref 0–44)
AST: 22 U/L (ref 15–41)
Albumin: 4.1 g/dL (ref 3.5–5.0)
Anion gap: 8 (ref 5–15)
BUN: 24 mg/dL — ABNORMAL HIGH (ref 8–23)
CO2: 28 mmol/L (ref 22–32)
CREATININE: 1.51 mg/dL — AB (ref 0.44–1.00)
Calcium: 9.1 mg/dL (ref 8.9–10.3)
Chloride: 99 mmol/L (ref 98–111)
GFR calc non Af Amer: 32 mL/min — ABNORMAL LOW (ref >60)
GFR, EST AFRICAN AMERICAN: 37 mL/min — AB (ref >60)
Glucose, Bld: 106 mg/dL — ABNORMAL HIGH (ref 70–99)
POTASSIUM: 3.7 mmol/L (ref 3.5–5.1)
Sodium: 135 mmol/L (ref 135–145)
Total Bilirubin: 0.6 mg/dL (ref 0.3–1.2)
Total Protein: 7.5 g/dL (ref 6.5–8.1)

## 2018-07-13 LAB — LACTATE DEHYDROGENASE: LDH: 148 U/L (ref 98–192)

## 2018-07-13 LAB — PROTIME-INR
INR: 1.03
PROTHROMBIN TIME: 13.4 s (ref 11.4–15.2)

## 2018-07-13 LAB — APTT: APTT: 39 s — AB (ref 24–36)

## 2018-07-14 ENCOUNTER — Other Ambulatory Visit (HOSPITAL_COMMUNITY): Payer: PPO

## 2018-07-16 ENCOUNTER — Other Ambulatory Visit: Payer: Self-pay | Admitting: Family Medicine

## 2018-07-20 NOTE — Telephone Encounter (Signed)
FYI, looks like she is on Femara per Dr. Walden Field as of 05/2018. LB

## 2018-07-22 ENCOUNTER — Ambulatory Visit (HOSPITAL_COMMUNITY): Payer: PPO | Admitting: Internal Medicine

## 2018-07-23 ENCOUNTER — Encounter (HOSPITAL_COMMUNITY): Payer: Self-pay | Admitting: Internal Medicine

## 2018-07-23 ENCOUNTER — Other Ambulatory Visit: Payer: Self-pay

## 2018-07-23 ENCOUNTER — Inpatient Hospital Stay (HOSPITAL_COMMUNITY): Payer: PPO | Attending: Hematology | Admitting: Internal Medicine

## 2018-07-23 VITALS — BP 163/50 | HR 97 | Temp 97.9°F | Resp 20 | Wt 125.1 lb

## 2018-07-23 DIAGNOSIS — Z170 Estrogen receptor positive status [ER+]: Secondary | ICD-10-CM | POA: Diagnosis not present

## 2018-07-23 DIAGNOSIS — D61818 Other pancytopenia: Secondary | ICD-10-CM

## 2018-07-23 DIAGNOSIS — I10 Essential (primary) hypertension: Secondary | ICD-10-CM

## 2018-07-23 DIAGNOSIS — C50511 Malignant neoplasm of lower-outer quadrant of right female breast: Secondary | ICD-10-CM | POA: Diagnosis not present

## 2018-07-23 DIAGNOSIS — R58 Hemorrhage, not elsewhere classified: Secondary | ICD-10-CM

## 2018-07-23 DIAGNOSIS — Z79899 Other long term (current) drug therapy: Secondary | ICD-10-CM

## 2018-07-23 NOTE — Patient Instructions (Addendum)
[facility] at [facility] Discharge Instructions  Today you saw Dr. Walden Field  Labs reviewed -   Stay off of Aspirin and anti-inflammatories such as Motrin, Advil, Ibuprofen, Aleve, Naproxen. Take Tylenol instead if needed.  Return to clinic in June 2020 to see MD and have labs. Mammogram due in June 2020 also.   Thank you for choosing [facility] at [facility] to provide your oncology and hematology care.  To afford each patient quality time with our provider, please arrive at least 15 minutes before your scheduled appointment time.   If you have a lab appointment with the [facility] please come in thru the  Main Entrance and check in at the main information desk  You need to re-schedule your appointment should you arrive 10 or more minutes late.  We strive to give you quality time with our providers, and arriving late affects you and other patients whose appointments are after yours.  Also, if you no show three or more times for appointments you may be dismissed from the clinic at the providers discretion.     Again, thank you for choosing [facility].  Our hope is that these requests will decrease the amount of time that you wait before being seen by our physicians.       _____________________________________________________________  Should you have questions after your visit to [facility], please contact our office at [phone] between the hours of 8:00 a.m. and 4:30 p.m.  Voicemails left after 4:00 p.m. will not be returned until the following business day.  For prescription refill requests, have your pharmacy contact our office and allow 72 hours.    Cancer Center Support Programs:   > Cancer Support Group  2nd Tuesday of the month 1pm-2pm, Journey Room

## 2018-07-23 NOTE — Progress Notes (Signed)
Diagnosis Malignant neoplasm of lower-outer quadrant of right breast of female, estrogen receptor positive ([place]) - Plan: DG Bone Density, MM Digital Diagnostic Bilat, CBC with Differential/Platelet, Comprehensive metabolic panel, Lactate dehydrogenase, Protime-INR, APTT  Bleeding - Plan: DG Bone Density, MM Digital Diagnostic Bilat, CBC with Differential/Platelet, Comprehensive metabolic panel, Lactate dehydrogenase, Protime-INR, APTT  Staging Cancer Staging Malignant neoplasm of lower-outer quadrant of right breast of female, estrogen receptor positive ([place]) Staging form: Breast, AJCC 8th Edition - Clinical stage from 02/22/2018: Stage IA (cT1c, cN0, cM0, G1, ER+, PR+, HER2-) - Signed by Truitt Merle, MD on 03/02/2018   Assessment and Plan:   1.  Leukopenia. Labs done 07/13/2018 reviewed and showed WBC 2.7 HB 10.6 plts 123,000.  Chemistries WNL with K= 3.7 Hb 1.51 and normal LFTs.  WBC is improved at 2.7.  Plt count is improved at 123,000.   She reports she is no longer on fish oil or ASA.  If counts worsen, she will be given option of bone marrow biopsy for definitive diagnosis due to leukopenia and thrombocytopenia  2.  Stage 1 A, T!c N0 G1 ER+, PR + her 2 negative right Breast cancer.  Pt had screening mammogram done 02/12/2018 that showed mass in the right breast that was indeterminate.  She denied feeling any abnormalities in the breast.  She subsequently had right breast diagnostic mammogram with USN done 02/16/2018 that showed 1.1 cm mass in the right breast at the 8:00 position suggestive of malignancy.  She also had a 0.5 cm intraductal mass in the right breast at the 11:00 position suspicious for malignancy.  Biopsy was recommended.    The patient underwent biopsy of the 2 areas of concern on 02/22/2018 with pathology from the right breast core biopsy at the 8 o'oclock returning as Invasive and in situ ductal carcinoma.  Core biopsy of the 11 o'clock position returned as fibrocystic changes.  The  invasive component was Grade 1.  Endocrine panel showed tumor was ER+ 100%, PR + 40%, Her 2 negative by FISH.    She was seen by Dr. Donne Hazel and underwent surgery on 04/08/2018 with pathology returning as 1. Breast, lumpectomy, Right w/seed - INVASIVE DUCTAL CARCINOMA WITH EXTRACELLULAR MUCIN, NOTTINGHAM GRADE 1 OF 3, 1.1 CM - DUCTAL CARCINOMA IN SITU, INTERMEDIATE NUCLEAR GRADE - MARGINS UNINVOLVED BY CARCINOMA (0.1 CM; POSTERIOR MARGIN) - FIBROCYSTIC CHANGES CHANGES INCLUDING APOCRINE METAPLASIA - PREVIOUS BIOPSY SITE CHANGES - SEE ONCOLOGY TABLE AND COMMENT BELOW 2. Breast, excision, right inferior margin - NO RESIDUAL CARCINOMA IDENTIFIED 3. Breast, excision, right lateral margin - NO RESIDUAL CARCINOMA IDENTIFIED 4. Breast, excision, right medial margin - NO RESIDUAL CARCINOMA IDENTIFIED  Pt was seen by Dr. Burr Medico and Dr. Isidore Moos on 03/03/2018 and was recommended for adjuvant RT and endocrine therapy.  Pt did not desire to take RT.    She has not begun Endocrine therapy and reports she desires to have breast cancer follow-up at [facility].    Pt had previous discussion that she has evidence of early stage HR+ right breast cancer.  She was not recommended for oncotype evaluation.    Previously, I discussed with her options of therapy with Femara 2.5 mg daily with initial goal of 5 years.  I have discussed use of AI to reduce risk of breast cancer recurrence.  Side effects of the medication were reviewed and include but are not Limited to hot flashes, bone and joint discomfort and osteoporosis.  Pt had Dexa done 12/25/2016 that showed osteopenia.  She will  be set up for repeat BMD in 12/2018.  Pt is recommended for Calcium and vitamin D.    Pt began  Femara 05/2018.  She is tolerating therapy.  She is due for bilateral diagnostic mammogram in 01/2019.  She will follow-up at that time to go over results.    3.  Thrombocytopenia.  Plt count has improved 86,000 on labs done 05/2018 and is 123,000 on  labs done 07/13/2018.  Coagulation studies showed PT 13.4 and PTT of 39.  She has a normal LDH of 148.  Pt is no longer on ASA.  She will have repeat labs on RTC in 01/2019.    4.  Easy bleeding and bruising.  She denies any family history of bleeding disorders or bleeding with procedures. She develops occasional bruises with bumping into things.  Pt had Coagulation studies done 01/29/2018 showed Normal PT 13.5 with PTT of 40.  ANA and VWF profile were normal.  Coagulation studies repeated on 07/13/2018 and showed Pt 13.4 PTT of 39.  Pt no longer on ASA or fish oil.  Work up has been negative for common bleeding disorders.  She denies any significant change in symptoms.  Will repeat labs in 01/2019.  She is advised to notify the office if any change in symptoms prior to follow-up.    5  HTN. BP is 163/50.  Follow-up with PCP for management.    25 minutes spent with more than 50% spent in counseling and coordination of care.    Interval History: Historical data obtained from note dated 01/29/2018.  84 yr old female who was last seen at [facility] in 2018 for Leukopenia and thrombocytopenia.  Pt did not keep her follow-up.    Current Status:  Pt is seen today for follow-up.  She is here to go over labs.  She reports she is tolerating Femara.  She denies any bleeding and has noted no change in bruises.    Oncology History   Cancer Staging Malignant neoplasm of lower-outer quadrant of right breast of female, estrogen receptor positive (HCC) Staging form: Breast, AJCC 8th Edition - Clinical stage from 02/22/2018: Stage IA (cT1c, cN0, cM0, G1, ER+, PR+, HER2-) - Signed by Feng, Yan, MD on 03/02/2018      Malignant neoplasm of lower-outer quadrant of right breast of female, estrogen receptor positive (HCC)   02/12/2018 Mammogram    She had routine screening bilateral mammography on 02/12/2018 at [facility] with results showing: indeterminate irregular mass in the right breast.     02/16/2018 Mammogram    She underwent  right diagnostic mammography with tomography and right breast ultrasonography at [facility] on 02/16/2018 showing: 1.1 cm irregular mass at the 8 O'clock position on the right breast    02/22/2018 Pathology Results    Diagnosis 1. Breast, right, needle core biopsy, lateral, 8 o'clock - INVASIVE AND IN SITU DUCTAL CARCINOMA. - SEE COMMENT. 2. Breast, right, needle core biopsy, 11 o'clock - FIBROCYSTIC CHANGES. - USUAL DUCTAL HYPERPLASIA. - THERE IS NO EVIDENCE OF MALIGNANCY.    02/22/2018 Receptors her2    IMMUNOHISTOCHEMICAL AND MORPHOMETRIC ANALYSIS PERFORMED MANUALLY Estrogen Receptor: 100%, POSITIVE, STRONG STAINING INTENSITY Progesterone Receptor: 40%, POSITIVE, MODERATE STAINING INTENSITY Proliferation Marker Ki67: 1% Her2 Negative    02/22/2018 Cancer Staging    Staging form: Breast, AJCC 8th Edition - Clinical stage from 02/22/2018: Stage IA (cT1c, cN0, cM0, G1, ER+, PR+, HER2-) - Signed by Feng, Yan, MD on 03/02/2018     02/25/2018 Initial Diagnosis      Malignant neoplasm of lower-outer quadrant of right breast of female, estrogen receptor positive (HCC)      Problem List Patient Active Problem List   Diagnosis Date Noted  . Breast cancer, right (HCC) [C50.911] 04/08/2018  . Malignant neoplasm of lower-outer quadrant of right breast of female, estrogen receptor positive (HCC) [C50.511, Z17.0] 02/25/2018  . Hematoma [T14.8XXA] 11/27/2016  . Severe epistaxis [R04.0] 11/23/2016  . Vertigo [R42] 09/09/2016  . Renal vascular disease [N28.89] 10/31/2015  . Nodule of right lung [R91.1] 10/31/2015  . Asthma without status asthmaticus [J45.909] 10/31/2015  . Essential hypertension, malignant [I10]   . Hypothyroidism [E03.9] 10/15/2015  . Pancytopenia (HCC) [D61.818] 10/15/2015  . Hyponatremia [E87.1] 10/14/2015  . Thrombocytopenia (HCC) [D69.6] 06/16/2015  . Right renal artery stenosis (HCC) [I70.1] 05/09/2015  . Chronic diastolic heart failure (HCC) [I50.32] 04/03/2014  . Hypertensive  urgency [I16.0] 04/02/2014  . Headache [R51] 04/02/2014  . CKD (chronic kidney disease) stage 3, GFR 30-59 ml/min (HCC) [N18.3] 04/02/2014  . Paroxysmal atrial fibrillation (HCC) [I48.0] 01/25/2014  . Hypokalemia [E87.6] 07/19/2013  . Anemia [D64.9] 07/19/2013  . Bilateral lower extremity edema [R60.0] 06/30/2013  . Hypertension with fluid overload [I10, E87.79] 06/30/2013  . Pedal edema [R60.0] 05/10/2013  . Varicose veins of lower extremities with other complications [I83.893] 05/03/2013  . ABNORMAL STRESS ELECTROCARDIOGRAM [R94.31] 01/23/2010  . Hyperlipidemia [E78.5] 12/18/2009  . Essential hypertension [I10] 12/18/2009  . RENAL INSUFFICIENCY, CHRONIC [N18.9] 12/18/2009  . Arthropathy [M12.9] 12/18/2009    Past Medical History Past Medical History:  Diagnosis Date  . Anemia   . Arthritis   . Asthma   . Atrial fibrillation (HCC)   . CKD (chronic kidney disease), stage III (HCC)   . Heart murmur   . Hyperlipidemia   . Hypertension   . Hypothyroidism   . Nodule of right lung    Right upper lobe  . PONV (postoperative nausea and vomiting)   . Renal insufficiency    Chronic  . Renal vascular disease   . Right renal artery stenosis (HCC) 05/09/2015  . Vertigo     Past Surgical History Past Surgical History:  Procedure Laterality Date  . BREAST LUMPECTOMY WITH RADIOACTIVE SEED LOCALIZATION Right 04/08/2018   Procedure: BREAST LUMPECTOMY WITH RADIOACTIVE SEED LOCALIZATION;  Surgeon: Wakefield, Matthew, MD;  Location: [facility] OR;  Service: General;  Laterality: Right;  . IR GENERIC HISTORICAL  08/29/2016   IR US GUIDE VASC ACCESS RIGHT 08/29/2016 [facility]INTER[facility]  . IR GENERIC HISTORICAL  08/29/2016   IR RENAL BILAT S&I MOD SED 08/29/2016 [facility]  . KNEE ARTHROSCOPY     2019  . THYROIDECTOMY, PARTIAL Right 1981   middle lobe removed 1st, then right lobe removed 7-8 years later (approx. 1988)    Family History Family History  Problem Relation Age of Onset  . Thyroid disease  Mother   . Stroke Father   . Hypertension Brother   . Lung cancer Maternal Uncle   . Cancer Maternal Grandmother        Liver  . Lung cancer Maternal Uncle   . Hypertension Daughter   . Hypercholesterolemia Daughter   . Hypercholesterolemia Daughter      Social History  reports that she has never smoked. She has never used smokeless tobacco. She reports that she does not drink alcohol or use drugs.  Medications  Current Outpatient Medications:  .  acetaminophen (TYLENOL) 325 MG tablet, Take 325 mg by mouth daily as needed for moderate pain or headache., Disp: , Rfl:  .    atenolol-chlorthalidone (TENORETIC) 50-25 MG tablet, TAKE 1 TABLET BY MOUTH DAILY., Disp: 90 tablet, Rfl: 1 .  atorvastatin (LIPITOR) 20 MG tablet, TAKE 1/2 TABLET DAILY, Disp: 45 tablet, Rfl: 0 .  calcium-vitamin D (OSCAL WITH D) 500-200 MG-UNIT per tablet, Take 1 tablet by mouth at bedtime. , Disp: , Rfl:  .  cholecalciferol (VITAMIN D) 1000 units tablet, Take 1,000 Units by mouth daily., Disp: , Rfl:  .  ferrous sulfate 325 (65 FE) MG tablet, Take 325 mg by mouth 2 (two) times daily with a meal., Disp: , Rfl:  .  flecainide (TAMBOCOR) 50 MG tablet, TAKE 1 TABLET (50 MG TOTAL) BY MOUTH 2 (TWO) TIMES DAILY., Disp: 180 tablet, Rfl: 60 .  fluticasone (FLONASE) 50 MCG/ACT nasal spray, Place 1 spray into both nostrils 2 (two) times daily. , Disp: , Rfl:  .  hydrALAZINE (APRESOLINE) 100 MG tablet, Take 100 mg by mouth 2 (two) times daily., Disp: , Rfl:  .  hydroxypropyl methylcellulose / hypromellose (ISOPTO TEARS / GONIOVISC) 2.5 % ophthalmic solution, Place 1 drop into both eyes daily. , Disp: , Rfl:  .  letrozole (FEMARA) 2.5 MG tablet, Take 1 tablet (2.5 mg total) by mouth daily., Disp: 90 tablet, Rfl: 4 .  levothyroxine (SYNTHROID, LEVOTHROID) 75 MCG tablet, TAKE 1 TABLET (75 MCG TOTAL) BY MOUTH DAILY., Disp: 90 tablet, Rfl: 3  Allergies Amlodipine; Isosorbide nitrate; Sulfonamide derivatives; and Terazosin  Review  of Systems Review of Systems - Oncology ROS negative other than minor bruising   Physical Exam  Vitals Wt Readings from Last 3 Encounters:  07/23/18 125 lb 1.6 oz (56.7 kg)  06/03/18 125 lb (56.7 kg)  05/28/18 127 lb 6.4 oz (57.8 kg)   Temp Readings from Last 3 Encounters:  07/23/18 97.9 F (36.6 C) (Oral)  06/03/18 97.6 F (36.4 C) (Oral)  05/28/18 97.6 F (36.4 C) (Oral)   BP Readings from Last 3 Encounters:  07/23/18 (!) 163/50  06/03/18 (!) 178/76  05/28/18 (!) 185/59   Pulse Readings from Last 3 Encounters:  07/23/18 97  06/03/18 (!) 51  05/28/18 (!) 55   Constitutional: Well-developed, well-nourished, and in no distress.   HENT: Head: Normocephalic and atraumatic.  Mouth/Throat: No oropharyngeal exudate. Mucosa moist. Eyes: Pupils are equal, round, and reactive to light. Conjunctivae are normal. No scleral icterus.  Neck: Normal range of motion. Neck supple. No JVD present.  Cardiovascular: Normal rate, regular rhythm and normal heart sounds.  Exam reveals no gallop and no friction rub.   No murmur heard. Pulmonary/Chest: Effort normal and breath sounds normal. No respiratory distress. No wheezes.No rales.  Abdominal: Soft. Bowel sounds are normal. No distension. There is no tenderness. There is no guarding.  Musculoskeletal: No edema or tenderness.  Lymphadenopathy: No cervical, axillary or supraclavicular adenopathy.  Neurological: Alert and oriented to person, place, and time. No cranial nerve deficit.  Skin: Skin is warm and dry. No rash noted. No erythema. No pallor. Minor bruising noted on back of hands, and on right Lower leg.   Psychiatric: Affect and judgment normal.  Breast exam:  Chaperone present. Right lumpectomy changes noted.   No dominant masses palpable bilaterally.    Labs No visits with results within 3 Day(s) from this visit.  Latest known visit with results is:  Appointment on 07/13/2018  Component Date Value Ref Range Status  . WBC  07/13/2018 2.7* 4.0 - 10.5 K/uL Final  . RBC 07/13/2018 3.79* 3.87 - 5.11 MIL/uL Final  . Hemoglobin 07/13/2018 10.6*   12.0 - 15.0 g/dL Final  . HCT 07/13/2018 33.5* 36.0 - 46.0 % Final  . MCV 07/13/2018 88.4  80.0 - 100.0 fL Final  . MCH 07/13/2018 28.0  26.0 - 34.0 pg Final  . MCHC 07/13/2018 31.6  30.0 - 36.0 g/dL Final  . RDW 07/13/2018 13.5  11.5 - 15.5 % Final  . Platelets 07/13/2018 123* 150 - 400 K/uL Final  . nRBC 07/13/2018 0.0  0.0 - 0.2 % Final  . Neutrophils Relative % 07/13/2018 67  % Final  . Neutro Abs 07/13/2018 1.8  1.7 - 7.7 K/uL Final  . Lymphocytes Relative 07/13/2018 22  % Final  . Lymphs Abs 07/13/2018 0.6* 0.7 - 4.0 K/uL Final  . Monocytes Relative 07/13/2018 11  % Final  . Monocytes Absolute 07/13/2018 0.3  0.1 - 1.0 K/uL Final  . Eosinophils Relative 07/13/2018 0  % Final  . Eosinophils Absolute 07/13/2018 0.0  0.0 - 0.5 K/uL Final  . Basophils Relative 07/13/2018 0  % Final  . Basophils Absolute 07/13/2018 0.0  0.0 - 0.1 K/uL Final  . Immature Granulocytes 07/13/2018 0  % Final  . Abs Immature Granulocytes 07/13/2018 0.00  0.00 - 0.07 K/uL Final   Performed at [facility], [address] [place], [place] [address]  . Sodium 07/13/2018 135  135 - 145 mmol/L Final  . Potassium 07/13/2018 3.7  3.5 - 5.1 mmol/L Final  . Chloride 07/13/2018 99  98 - 111 mmol/L Final  . CO2 07/13/2018 28  22 - 32 mmol/L Final  . Glucose, Bld 07/13/2018 106* 70 - 99 mg/dL Final  . BUN 07/13/2018 24* 8 - 23 mg/dL Final  . Creatinine, Ser 07/13/2018 1.51* 0.44 - 1.00 mg/dL Final  . Calcium 07/13/2018 9.1  8.9 - 10.3 mg/dL Final  . Total Protein 07/13/2018 7.5  6.5 - 8.1 g/dL Final  . Albumin 07/13/2018 4.1  3.5 - 5.0 g/dL Final  . AST 07/13/2018 22  15 - 41 U/L Final  . ALT 07/13/2018 16  0 - 44 U/L Final  . Alkaline Phosphatase 07/13/2018 62  38 - 126 U/L Final  . Total Bilirubin 07/13/2018 0.6  0.3 - 1.2 mg/dL Final  . GFR calc non Af Amer 07/13/2018 32* >60 mL/min Final  .  GFR calc Af Amer 07/13/2018 37* >60 mL/min Final  . Anion gap 07/13/2018 8  5 - 15 Final   Performed at [facility], [address] [place], [place] [address]  . LDH 07/13/2018 148  98 - 192 U/L Final   Performed at [facility], [address] [place], [place] [address]  . Prothrombin Time 07/13/2018 13.4  11.4 - 15.2 seconds Final  . INR 07/13/2018 1.03   Final   Performed at [facility], [address] [place], [place] [address]  . aPTT 07/13/2018 39* 24 - 36 seconds Final   Comment:        IF BASELINE aPTT IS ELEVATED, SUGGEST PATIENT RISK ASSESSMENT BE USED TO DETERMINE APPROPRIATE ANTICOAGULANT THERAPY. Performed at [facility], [address] [place], [place] [address]      Pathology Orders Placed This Encounter  Procedures  . DG Bone Density    Standing Status:   Future    Standing Expiration Date:   07/23/2019    Order Specific Question:   Reason for Exam (SYMPTOM  OR DIAGNOSIS REQUIRED)    Answer:   postmenopausal    Order Specific Question:   Preferred imaging location?    Answer:     External  . MM Digital Diagnostic Bilat    Standing Status:   Future    Standing Expiration Date:   07/23/2019    Order Specific Question:   Reason for Exam (SYMPTOM  OR DIAGNOSIS REQUIRED)    Answer:   right breast cancer    Order Specific Question:   Preferred imaging location?    Answer:   External  . CBC with Differential/Platelet    Standing Status:   Future    Standing Expiration Date:   07/23/2020  . Comprehensive metabolic panel    Standing Status:   Future    Standing Expiration Date:   07/23/2020  . Lactate dehydrogenase    Standing Status:   Future    Standing Expiration Date:   07/23/2020  . Protime-INR    Standing Status:   Future    Standing Expiration Date:   07/23/2020  . APTT    Standing Status:   Future    Standing Expiration Date:   07/23/2020       Vetta Higgs MD 

## 2018-08-20 ENCOUNTER — Other Ambulatory Visit: Payer: Self-pay

## 2018-08-20 ENCOUNTER — Emergency Department (HOSPITAL_COMMUNITY)
Admission: EM | Admit: 2018-08-20 | Discharge: 2018-08-20 | Disposition: A | Discharge status: Home / Self Care | Payer: PPO | Attending: Emergency Medicine | Admitting: Emergency Medicine

## 2018-08-20 ENCOUNTER — Encounter (HOSPITAL_COMMUNITY): Payer: Self-pay

## 2018-08-20 ENCOUNTER — Ambulatory Visit (INDEPENDENT_AMBULATORY_CARE_PROVIDER_SITE_OTHER): Payer: PPO | Admitting: Family Medicine

## 2018-08-20 ENCOUNTER — Encounter: Payer: Self-pay | Admitting: Family Medicine

## 2018-08-20 ENCOUNTER — Emergency Department (HOSPITAL_COMMUNITY): Payer: PPO

## 2018-08-20 VITALS — BP 107/45 | HR 50 | Temp 98.3°F

## 2018-08-20 DIAGNOSIS — R05 Cough: Secondary | ICD-10-CM | POA: Diagnosis not present

## 2018-08-20 DIAGNOSIS — E039 Hypothyroidism, unspecified: Secondary | ICD-10-CM | POA: Diagnosis not present

## 2018-08-20 DIAGNOSIS — I5032 Chronic diastolic (congestive) heart failure: Secondary | ICD-10-CM | POA: Insufficient documentation

## 2018-08-20 DIAGNOSIS — I130 Hypertensive heart and chronic kidney disease with heart failure and stage 1 through stage 4 chronic kidney disease, or unspecified chronic kidney disease: Secondary | ICD-10-CM | POA: Insufficient documentation

## 2018-08-20 DIAGNOSIS — Z79899 Other long term (current) drug therapy: Secondary | ICD-10-CM | POA: Diagnosis not present

## 2018-08-20 DIAGNOSIS — J45909 Unspecified asthma, uncomplicated: Secondary | ICD-10-CM | POA: Insufficient documentation

## 2018-08-20 DIAGNOSIS — I4891 Unspecified atrial fibrillation: Secondary | ICD-10-CM | POA: Diagnosis not present

## 2018-08-20 DIAGNOSIS — J069 Acute upper respiratory infection, unspecified: Secondary | ICD-10-CM | POA: Insufficient documentation

## 2018-08-20 DIAGNOSIS — N183 Chronic kidney disease, stage 3 (moderate): Secondary | ICD-10-CM | POA: Diagnosis not present

## 2018-08-20 DIAGNOSIS — R404 Transient alteration of awareness: Secondary | ICD-10-CM

## 2018-08-20 DIAGNOSIS — R55 Syncope and collapse: Secondary | ICD-10-CM | POA: Diagnosis not present

## 2018-08-20 DIAGNOSIS — R0989 Other specified symptoms and signs involving the circulatory and respiratory systems: Secondary | ICD-10-CM | POA: Diagnosis not present

## 2018-08-20 LAB — CBC
HCT: 33.5 % — ABNORMAL LOW (ref 36.0–46.0)
Hemoglobin: 11 g/dL — ABNORMAL LOW (ref 12.0–15.0)
MCH: 28.8 pg (ref 26.0–34.0)
MCHC: 32.8 g/dL (ref 30.0–36.0)
MCV: 87.7 fL (ref 80.0–100.0)
Platelets: 125 10*3/uL — ABNORMAL LOW (ref 150–400)
RBC: 3.82 MIL/uL — ABNORMAL LOW (ref 3.87–5.11)
RDW: 14.1 % (ref 11.5–15.5)
WBC: 2.7 10*3/uL — ABNORMAL LOW (ref 4.0–10.5)
nRBC: 0 % (ref 0.0–0.2)

## 2018-08-20 LAB — GLUCOSE HEMOCUE WAIVED: Glu Hemocue Waived: 139 mg/dL — ABNORMAL HIGH (ref 65–99)

## 2018-08-20 LAB — BASIC METABOLIC PANEL
Anion gap: 8 (ref 5–15)
BUN: 22 mg/dL (ref 8–23)
CO2: 28 mmol/L (ref 22–32)
CREATININE: 1.49 mg/dL — AB (ref 0.44–1.00)
Calcium: 8.9 mg/dL (ref 8.9–10.3)
Chloride: 95 mmol/L — ABNORMAL LOW (ref 98–111)
GFR calc Af Amer: 38 mL/min — ABNORMAL LOW (ref >60)
GFR calc non Af Amer: 32 mL/min — ABNORMAL LOW (ref >60)
GLUCOSE: 146 mg/dL — AB (ref 70–99)
Potassium: 3.4 mmol/L — ABNORMAL LOW (ref 3.5–5.1)
Sodium: 131 mmol/L — ABNORMAL LOW (ref 135–145)

## 2018-08-20 LAB — TROPONIN I: Troponin I: <0.03 ng/mL (ref <0.03)

## 2018-08-20 MED ORDER — GUAIFENESIN-DM 100-10 MG/5ML PO SYRP: 5.0000 mL | ORAL_SOLUTION | ORAL | 0 refills | Status: DC | PRN

## 2018-08-20 NOTE — Addendum Note (Signed)
Addended by: Nigel Berthold C on: 08/20/2018 10:22 AM   Modules accepted: Orders

## 2018-08-20 NOTE — ED Provider Notes (Signed)
[facility] EMERGENCY DEPARTMENT Provider Note   CSN: [id number] Arrival date & time: 08/20/18  1000     History   Chief Complaint Chief Complaint  Patient presents with  . Loss of Consciousness    HPI [name] is a 85 y.o. female.  HPI Patient presents after a syncopal episode in the doctor's office.  States she is having URI symptoms cough and some drainage down the back of her throat.  States she was walking the scale and passed out.  She was caught.  Initial blood pressure was low.  No headache.  No confusion.  Feels better.  Has had a somewhat decreased oral intake recently.  Has a baseline bradycardia. Past Medical History:  Diagnosis Date  . Anemia   . Arthritis   . Asthma   . Atrial fibrillation ([place])   . CKD (chronic kidney disease), stage III ([place])   . Heart murmur   . Hyperlipidemia   . Hypertension   . Hypothyroidism   . Nodule of right lung    Right upper lobe  . PONV (postoperative nausea and vomiting)   . Renal insufficiency    Chronic  . Renal vascular disease   . Right renal artery stenosis ([place]) 05/09/2015  . Vertigo     Patient Active Problem List   Diagnosis Date Noted  . Breast cancer, right ([place]) 04/08/2018  . Malignant neoplasm of lower-outer quadrant of right breast of female, estrogen receptor positive ([place]) 02/25/2018  . Hematoma 11/27/2016  . Severe epistaxis 11/23/2016  . Vertigo 09/09/2016  . Renal vascular disease 10/31/2015  . Nodule of right lung 10/31/2015  . Asthma without status asthmaticus 10/31/2015  . Essential hypertension, malignant   . Hypothyroidism 10/15/2015  . Pancytopenia ([place]) 10/15/2015  . Hyponatremia 10/14/2015  . Thrombocytopenia ([place]) 06/16/2015  . Right renal artery stenosis ([place]) 05/09/2015  . Chronic diastolic heart failure ([place]) 04/03/2014  . Hypertensive urgency 04/02/2014  . Headache 04/02/2014  . CKD (chronic kidney disease) stage 3, GFR 30-59 ml/min ([facility]) 04/02/2014  . Paroxysmal atrial  fibrillation ([place]) 01/25/2014  . Hypokalemia 07/19/2013  . Anemia 07/19/2013  . Bilateral lower extremity edema 06/30/2013  . Hypertension with fluid overload 06/30/2013  . Pedal edema 05/10/2013  . Varicose veins of lower extremities with other complications 78/29/5621  . ABNORMAL STRESS ELECTROCARDIOGRAM 01/23/2010  . Hyperlipidemia 12/18/2009  . Essential hypertension 12/18/2009  . RENAL INSUFFICIENCY, CHRONIC 12/18/2009  . Arthropathy 12/18/2009    Past Surgical History:  Procedure Laterality Date  . BREAST LUMPECTOMY WITH RADIOACTIVE SEED LOCALIZATION Right 04/08/2018   Procedure: BREAST LUMPECTOMY WITH RADIOACTIVE SEED LOCALIZATION;  Surgeon: Rolm Bookbinder, MD;  Location: [facility];  Service: General;  Laterality: Right;  . IR GENERIC HISTORICAL  08/29/2016   IR US GUIDE VASC ACCESS RIGHT 08/29/2016 [facility]INTER[facility]  . IR GENERIC HISTORICAL  08/29/2016   IR RENAL BILAT S&I MOD SED 08/29/2016 [facility]  . KNEE ARTHROSCOPY     2019  . THYROIDECTOMY, PARTIAL Right 1981   middle lobe removed 1st, then right lobe removed 7-8 years later (approx. 1988)     OB History   No obstetric history on file.      Home Medications    Prior to Admission medications   Medication Sig Start Date End Date Taking? Authorizing Provider  acetaminophen (TYLENOL) 325 MG tablet Take 325 mg by mouth daily as needed for moderate pain or headache.    [provider]  atenolol-chlorthalidone (TENORETIC) 50-25 MG tablet TAKE  1 TABLET BY MOUTH DAILY. 03/15/18   Chipper Herb, MD  atorvastatin (LIPITOR) 20 MG tablet TAKE 1/2 TABLET DAILY 07/16/18   Chipper Herb, MD  calcium-vitamin D (OSCAL WITH D) 500-200 MG-UNIT per tablet Take 2 tablets by mouth at bedtime.     [provider]  cholecalciferol (VITAMIN D) 1000 units tablet Take 1,000 Units by mouth daily.    [provider]  ferrous sulfate 325 (65 FE) MG tablet Take 325 mg by mouth 2 (two) times daily with a meal.     [provider]  flecainide (TAMBOCOR) 50 MG tablet TAKE 1 TABLET (50 MG TOTAL) BY MOUTH 2 (TWO) TIMES DAILY. 07/13/17   Minus Breeding, MD  fluticasone (FLONASE) 50 MCG/ACT nasal spray Place 1 spray into both nostrils 2 (two) times daily.  06/25/16   [provider]  guaiFENesin-dextromethorphan (ROBITUSSIN DM) 100-10 MG/5ML syrup Take 5 mLs by mouth every 4 (four) hours as needed for cough. 08/20/18   Davonna Belling, MD  hydrALAZINE (APRESOLINE) 100 MG tablet Take 100 mg by mouth 2 (two) times daily.    [provider]  hydroxypropyl methylcellulose / hypromellose (ISOPTO TEARS / GONIOVISC) 2.5 % ophthalmic solution Place 1 drop into both eyes daily.     [provider]  letrozole (FEMARA) 2.5 MG tablet Take 1 tablet (2.5 mg total) by mouth daily. 05/28/18   Higgs, Mathis Dad, MD  levothyroxine (SYNTHROID, LEVOTHROID) 75 MCG tablet TAKE 1 TABLET (75 MCG TOTAL) BY MOUTH DAILY. 01/18/18   Chipper Herb, MD    Family History Family History  Problem Relation Age of Onset  . Thyroid disease Mother   . Stroke Father   . Hypertension Brother   . Lung cancer Maternal Uncle   . Cancer Maternal Grandmother        Liver  . Lung cancer Maternal Uncle   . Hypertension Daughter   . Hypercholesterolemia Daughter   . Hypercholesterolemia Daughter     Social History Social History   Tobacco Use  . Smoking status: Never Smoker  . Smokeless tobacco: Never Used  . Tobacco comment: Pt denies cigarettes  Substance Use Topics  . Alcohol use: No  . Drug use: No     Allergies   Amlodipine; Isosorbide nitrate; Sulfonamide derivatives; and Terazosin   Review of Systems Review of Systems  Constitutional: Positive for appetite change.  HENT: Positive for congestion.   Respiratory: Positive for cough. Negative for chest tightness and shortness of breath.   Gastrointestinal: Negative for abdominal pain.  Genitourinary: Negative for flank pain.  Musculoskeletal:  Negative for back pain.  Neurological: Negative for weakness.  Psychiatric/Behavioral: Negative for behavioral problems.     Physical Exam Updated Vital Signs BP (!) 182/60 (BP Location: Right Arm)   Pulse (!) 54   Temp 98.4 F (36.9 C) (Oral)   Resp 15   Ht 5\' 6"  (1.676 m)   Wt 54.4 kg   SpO2 93%   BMI 19.37 kg/m   Physical Exam HENT:     Head: Normocephalic.     Nose: Congestion present.     Mouth/Throat:     Mouth: Mucous membranes are moist.  Eyes:     Extraocular Movements: Extraocular movements intact.  Neck:     Musculoskeletal: Neck supple.  Cardiovascular:     Rate and Rhythm: Regular rhythm.     Heart sounds: No murmur.     Comments: Mild bradycardia. Pulmonary:     Effort: Pulmonary effort is normal.  Abdominal:     Palpations: Abdomen is soft.  Musculoskeletal:     Right lower leg: No edema.     Left lower leg: No edema.  Skin:    Capillary Refill: Capillary refill takes less than 2 seconds.     Coloration: Skin is not jaundiced.  Neurological:     General: No focal deficit present.     Mental Status: She is alert.  Psychiatric:        Mood and Affect: Mood normal.      ED Treatments / Results  Labs (all labs ordered are listed, but only abnormal results are displayed) Labs Reviewed  CBC - Abnormal; Notable for the following components:      Result Value   WBC 2.7 (*)    RBC 3.82 (*)    Hemoglobin 11.0 (*)    HCT 33.5 (*)    Platelets 125 (*)    All other components within normal limits  BASIC METABOLIC PANEL - Abnormal; Notable for the following components:   Sodium 131 (*)    Potassium 3.4 (*)    Chloride 95 (*)    Glucose, Bld 146 (*)    Creatinine, Ser 1.49 (*)    GFR calc non Af Amer 32 (*)    GFR calc Af Amer 38 (*)    All other components within normal limits  TROPONIN I    EKG EKG Interpretation  Date/Time:  Friday August 20 2018 10:25:26 EST Ventricular Rate:  53 PR Interval:  200 QRS Duration: 94 QT  Interval:  504 QTC Calculation: 472 R Axis:   153 Text Interpretation:  Sinus bradycardia Right axis deviation Incomplete right bundle branch block Abnormal ECG No significant change since last tracing Confirmed by Davonna Belling [phone]) on 08/20/2018 10:33:00 AM   Radiology Dg Chest 2 View  Result Date: 08/20/2018 CLINICAL DATA:  Cough and chest congestion for the past few days. Syncopal episode today. EXAM: CHEST - 2 VIEW COMPARISON:  Chest x-ray dated January 18, 2018. FINDINGS: Stable borderline cardiomegaly. Normal pulmonary vascularity. No focal consolidation, pleural effusion, or pneumothorax. No acute osseous abnormality. Tiny clips in the right breast. IMPRESSION: No active cardiopulmonary disease. Electronically Signed   By: Titus Dubin M.D.   On: 08/20/2018 12:34    Procedures Procedures (including critical care time)  Medications Ordered in ED Medications - No data to display   Initial Impression / Assessment and Plan / ED Course  I have reviewed the triage vital signs and the nursing notes.  Pertinent labs & imaging results that were available during my care of the patient were reviewed by me and considered in my medical decision making (see chart for details).     Patient with syncopal episode.  Was at [facility] office and was hypotensive with episode.  Feeling better now.  Has had URI symptoms and had decreased oral intake.  Not orthostatic now.  Mild bradycardia appears to be stable.  I think she is stable for discharge.  Will increase oral intake.  Will give Robitussin-DM for symptomatic treatment.  Final Clinical Impressions(s) / ED Diagnoses   Final diagnoses:  Near syncope  Upper respiratory tract infection, unspecified type    ED Discharge Orders         Ordered    guaiFENesin-dextromethorphan (ROBITUSSIN DM) 100-10 MG/5ML syrup  Every 4 hours PRN     08/20/18 1302           Davonna Belling, MD 08/20/18 1307

## 2018-08-20 NOTE — Progress Notes (Addendum)
BP (!) 107/45   Pulse (!) 50   Temp 98.3 F (36.8 C) (Oral)   SpO2 92%    Subjective:    Patient ID: [name], female    DOB: February 01, 1934, 85 y.o.   MRN: [id number]  HPI: [name] is a 85 y.o. female presenting on 08/20/2018 for No chief complaint on file.   HPI Patient comes in today initially for sinus pressure and congestion but when walking back from the front to be weighed on the scale patient had a witnessed by me and to nurses altered mental status that almost appeared like a absence seizure, patient's eyes were open but she was nonresponsive and almost fell over on [place] and we caught her and put her in a chair and for about 5 seconds afterwards she was still nonresponsive and still had her eyes open with some mouth movements and twitching and a small amount of eye twitching as well.  Patient's name was called to her throughout this time and a sternal rub was performed and she still did not respond initially and then after about the 5 seconds came to and was completely herself again.  She says she remembers everything up until right before the scale and then she said she started to feel funny in her head and then she does not remember anything until she was sitting in the chair.  Her blood pressure initially was 80/47 and heart rate in the 40s and then it came up to 107/45.  Patient says she is having some sinus congestion but otherwise denies any other symptoms outside of what we witnessed and she denies this having happened before.  She is able to move all 4 extremities and denies any blurred vision or visual changes.  She denies any fevers or chills.  Patient is currently on treatment for breast cancer.  Relevant past medical, surgical, family and social history reviewed and updated as indicated. Interim medical history since our last visit reviewed. Allergies and medications reviewed and updated.  Review of Systems  Constitutional: Negative for chills and fever.  HENT:  Negative for congestion, ear discharge and ear pain.   Eyes: Negative for redness and visual disturbance.  Respiratory: Negative for chest tightness and shortness of breath.   Cardiovascular: Negative for chest pain and leg swelling.  Genitourinary: Negative for difficulty urinating and dysuria.  Musculoskeletal: Negative for back pain and gait problem.  Skin: Negative for rash.  Neurological: Positive for dizziness and light-headedness. Negative for speech difficulty, weakness and headaches.  Psychiatric/Behavioral: Negative for agitation and behavioral problems.  All other systems reviewed and are negative.   Per HPI unless specifically indicated above   Allergies as of 08/20/2018      Reactions   Amlodipine Other (See Comments)   edema   Isosorbide Nitrate Nausea Only   Sulfonamide Derivatives Nausea Only, Rash   Terazosin Hives, Nausea Only      Medication List       Accurate as of August 20, 2018  9:26 AM. Always use your most recent med list.        acetaminophen 325 MG tablet Commonly known as:  TYLENOL Take 325 mg by mouth daily as needed for moderate pain or headache.   atenolol-chlorthalidone 50-25 MG tablet Commonly known as:  TENORETIC TAKE 1 TABLET BY MOUTH DAILY.   atorvastatin 20 MG tablet Commonly known as:  LIPITOR TAKE 1/2 TABLET DAILY   calcium-vitamin D 500-200 MG-UNIT tablet Commonly known as:  OSCAL  WITH D Take 2 tablets by mouth at bedtime.   cholecalciferol 1000 units tablet Commonly known as:  VITAMIN D Take 1,000 Units by mouth daily.   ferrous sulfate 325 (65 FE) MG tablet Take 325 mg by mouth 2 (two) times daily with a meal.   flecainide 50 MG tablet Commonly known as:  TAMBOCOR TAKE 1 TABLET (50 MG TOTAL) BY MOUTH 2 (TWO) TIMES DAILY.   fluticasone 50 MCG/ACT nasal spray Commonly known as:  FLONASE Place 1 spray into both nostrils 2 (two) times daily.   hydrALAZINE 100 MG tablet Commonly known as:  APRESOLINE Take 100 mg by  mouth 2 (two) times daily.   hydroxypropyl methylcellulose / hypromellose 2.5 % ophthalmic solution Commonly known as:  ISOPTO TEARS / GONIOVISC Place 1 drop into both eyes daily.   letrozole 2.5 MG tablet Commonly known as:  FEMARA Take 1 tablet (2.5 mg total) by mouth daily.   levothyroxine 75 MCG tablet Commonly known as:  SYNTHROID, LEVOTHROID TAKE 1 TABLET (75 MCG TOTAL) BY MOUTH DAILY.          Objective:    BP (!) 107/45   Pulse (!) 50   Temp 98.3 F (36.8 C) (Oral)   SpO2 92%   Wt Readings from Last 3 Encounters:  07/23/18 125 lb 1.6 oz (56.7 kg)  06/03/18 125 lb (56.7 kg)  05/28/18 127 lb 6.4 oz (57.8 kg)    Physical Exam Vitals signs and nursing note reviewed.  Constitutional:      General: She is not in acute distress.    Appearance: She is well-developed. She is not diaphoretic.  HENT:     Nose: Nose normal.     Mouth/Throat:     Mouth: Mucous membranes are moist.     Pharynx: Oropharynx is clear. No oropharyngeal exudate or posterior oropharyngeal erythema.  Eyes:     General: No scleral icterus.    Extraocular Movements: Extraocular movements intact.     Conjunctiva/sclera: Conjunctivae normal.     Pupils: Pupils are equal, round, and reactive to light.  Neck:     Musculoskeletal: Normal range of motion.  Cardiovascular:     Rate and Rhythm: Normal rate and regular rhythm.     Heart sounds: Normal heart sounds. No murmur.  Pulmonary:     Effort: Pulmonary effort is normal. No respiratory distress.     Breath sounds: Normal breath sounds. No wheezing.  Musculoskeletal: Normal range of motion.        General: No swelling, tenderness or deformity.  Skin:    General: Skin is warm and dry.     Findings: No rash.  Neurological:     Mental Status: She is alert and oriented to person, place, and time.     Coordination: Coordination normal.  Psychiatric:        Behavior: Behavior normal.         Assessment & Plan:   Problem List Items  Addressed This Visit    None    Visit Diagnoses    Syncope, unspecified syncope type    -  Primary   Transient alteration of awareness          EMS was called because of her history of cancer and possible absence seizure-like episode in her office and when she got into the EMS apparently she or they refused to transport her so patient's daughter will take her to the emergency department.  Patient has no history of syncopal episodes or seizures  Patient's daughter will take her to the emergency department for possible absence seizure/syncopal episode Follow up plan: Return if symptoms worsen or fail to improve.  Counseling provided for all of the vaccine components No orders of the defined types were placed in this encounter.   Caryl Pina, MD [facility] 08/20/2018, 9:26 AM

## 2018-08-20 NOTE — ED Triage Notes (Signed)
Pt has had congestion and cough for the last few days. States she got up today at the doctor and was walking to the scale and passed out. Did not hit her head. EMS was called and stated pt's BP was 86/46. They were going to transport her to [facility], but she did not want to go there. Daughter brought her here. VSS in triage.

## 2018-09-17 ENCOUNTER — Other Ambulatory Visit: Payer: Self-pay | Admitting: Family Medicine

## 2018-09-17 DIAGNOSIS — I10 Essential (primary) hypertension: Secondary | ICD-10-CM

## 2018-10-05 ENCOUNTER — Other Ambulatory Visit: Payer: PPO

## 2018-10-05 DIAGNOSIS — E559 Vitamin D deficiency, unspecified: Secondary | ICD-10-CM

## 2018-10-05 DIAGNOSIS — E7800 Pure hypercholesterolemia, unspecified: Secondary | ICD-10-CM | POA: Diagnosis not present

## 2018-10-05 DIAGNOSIS — N183 Chronic kidney disease, stage 3 (moderate): Secondary | ICD-10-CM | POA: Diagnosis not present

## 2018-10-05 DIAGNOSIS — D649 Anemia, unspecified: Secondary | ICD-10-CM | POA: Diagnosis not present

## 2018-10-05 DIAGNOSIS — I480 Paroxysmal atrial fibrillation: Secondary | ICD-10-CM

## 2018-10-05 DIAGNOSIS — I10 Essential (primary) hypertension: Secondary | ICD-10-CM

## 2018-10-06 ENCOUNTER — Telehealth: Payer: Self-pay | Admitting: Family Medicine

## 2018-10-06 LAB — CBC WITH DIFFERENTIAL/PLATELET
Basophils Absolute: 0 10*3/uL (ref 0.0–0.2)
Basos: 1 %
EOS (ABSOLUTE): 0 10*3/uL (ref 0.0–0.4)
Eos: 1 %
Hematocrit: 35.6 % (ref 34.0–46.6)
Hemoglobin: 11.6 g/dL (ref 11.1–15.9)
Immature Grans (Abs): 0 10*3/uL (ref 0.0–0.1)
Immature Granulocytes: 1 %
Lymphocytes Absolute: 0.8 10*3/uL (ref 0.7–3.1)
Lymphs: 36 %
MCH: 28.5 pg (ref 26.6–33.0)
MCHC: 32.6 g/dL (ref 31.5–35.7)
MCV: 88 fL (ref 79–97)
Monocytes Absolute: 0.2 10*3/uL (ref 0.1–0.9)
Monocytes: 10 %
Neutrophils Absolute: 1.2 10*3/uL — ABNORMAL LOW (ref 1.4–7.0)
Neutrophils: 51 %
RBC: 4.07 x10E6/uL (ref 3.77–5.28)
RDW: 13.7 % (ref 11.7–15.4)
WBC: 2.2 10*3/uL — CL (ref 3.4–10.8)

## 2018-10-06 LAB — HEPATIC FUNCTION PANEL
ALK PHOS: 59 IU/L (ref 39–117)
ALT: 8 IU/L (ref 0–32)
AST: 20 IU/L (ref 0–40)
Albumin: 4 g/dL (ref 3.6–4.6)
Bilirubin Total: 0.4 mg/dL (ref 0.0–1.2)
Bilirubin, Direct: 0.1 mg/dL (ref 0.00–0.40)
Total Protein: 6.9 g/dL (ref 6.0–8.5)

## 2018-10-06 LAB — VITAMIN D 25 HYDROXY (VIT D DEFICIENCY, FRACTURES): Vit D, 25-Hydroxy: 50.6 ng/mL (ref 30.0–100.0)

## 2018-10-06 LAB — BMP8+EGFR
BUN/Creatinine Ratio: 15 (ref 12–28)
BUN: 22 mg/dL (ref 8–27)
CO2: 28 mmol/L (ref 20–29)
Calcium: 9.4 mg/dL (ref 8.7–10.3)
Chloride: 96 mmol/L (ref 96–106)
Creatinine, Ser: 1.42 mg/dL — ABNORMAL HIGH (ref 0.57–1.00)
GFR calc Af Amer: 40 mL/min/{1.73_m2} — ABNORMAL LOW (ref >59)
GFR calc non Af Amer: 34 mL/min/{1.73_m2} — ABNORMAL LOW (ref >59)
GLUCOSE: 101 mg/dL — AB (ref 65–99)
Potassium: 3.7 mmol/L (ref 3.5–5.2)
Sodium: 140 mmol/L (ref 134–144)

## 2018-10-06 LAB — LIPID PANEL
CHOLESTEROL TOTAL: 138 mg/dL (ref 100–199)
Chol/HDL Ratio: 2.7 ratio (ref 0.0–4.4)
HDL: 52 mg/dL (ref >39)
LDL Calculated: 64 mg/dL (ref 0–99)
Triglycerides: 108 mg/dL (ref 0–149)
VLDL Cholesterol Cal: 22 mg/dL (ref 5–40)

## 2018-10-06 NOTE — Telephone Encounter (Signed)
Pt will be here [place] with husband = will go over then

## 2018-10-13 ENCOUNTER — Encounter: Payer: Self-pay | Admitting: Family Medicine

## 2018-10-13 ENCOUNTER — Ambulatory Visit (INDEPENDENT_AMBULATORY_CARE_PROVIDER_SITE_OTHER): Payer: PPO | Admitting: Family Medicine

## 2018-10-13 VITALS — BP 143/58 | HR 54 | Temp 97.8°F | Ht 66.0 in | Wt 122.0 lb

## 2018-10-13 DIAGNOSIS — D61818 Other pancytopenia: Secondary | ICD-10-CM | POA: Diagnosis not present

## 2018-10-13 DIAGNOSIS — D696 Thrombocytopenia, unspecified: Secondary | ICD-10-CM

## 2018-10-13 DIAGNOSIS — D692 Other nonthrombocytopenic purpura: Secondary | ICD-10-CM | POA: Diagnosis not present

## 2018-10-13 DIAGNOSIS — N183 Chronic kidney disease, stage 3 unspecified: Secondary | ICD-10-CM

## 2018-10-13 DIAGNOSIS — I480 Paroxysmal atrial fibrillation: Secondary | ICD-10-CM | POA: Diagnosis not present

## 2018-10-13 DIAGNOSIS — E7800 Pure hypercholesterolemia, unspecified: Secondary | ICD-10-CM

## 2018-10-13 DIAGNOSIS — C50011 Malignant neoplasm of nipple and areola, right female breast: Secondary | ICD-10-CM | POA: Diagnosis not present

## 2018-10-13 DIAGNOSIS — I5032 Chronic diastolic (congestive) heart failure: Secondary | ICD-10-CM | POA: Diagnosis not present

## 2018-10-13 DIAGNOSIS — E559 Vitamin D deficiency, unspecified: Secondary | ICD-10-CM

## 2018-10-13 DIAGNOSIS — I10 Essential (primary) hypertension: Secondary | ICD-10-CM

## 2018-10-13 NOTE — Addendum Note (Signed)
Addended by: Zannie Cove on: 10/13/2018 10:44 AM   Modules accepted: Orders

## 2018-10-13 NOTE — Patient Instructions (Addendum)
Medicare Annual Wellness Visit  [facility] and the medical providers at [facility] strive to bring you the best medical care.  In doing so we not only want to address your current medical conditions and concerns but also to detect new conditions early and prevent illness, disease and health-related problems.    Medicare offers a yearly Wellness Visit which allows our clinical staff to assess your need for preventative services including immunizations, lifestyle education, counseling to decrease risk of preventable diseases and screening for fall risk and other medical concerns.    This visit is provided free of charge (no copay) for all Medicare recipients. The clinical pharmacists at [facility] have begun to conduct these Wellness Visits which will also include a thorough review of all your medications.    As you primary medical provider recommend that you make an appointment for your Annual Wellness Visit if you have not done so already this year.  You may set up this appointment before you leave today or you may call back [phone]) and schedule an appointment.  Please make sure when you call that you mention that you are scheduling your Annual Wellness Visit with the clinical pharmacist so that the appointment may be made for the proper length of time.     Continue current medications. Continue good therapeutic lifestyle changes which include good diet and exercise. Fall precautions discussed with patient. If an FOBT was given today- please return it to our front desk. If you are over 83 years old - you may need Prevnar 20 or the adult Pneumonia vaccine.  **Flu shots are available--- please call and schedule a FLU-CLINIC appointment**  After your visit with [place] today you will receive a survey in the mail or online from [employer] regarding your care with [place]. Please take a moment to fill this out. Your feedback is very  important to [place] as you can help [place] better understand your patient needs as well as improve your experience and satisfaction. WE CARE ABOUT YOU!!!   The patient should continue to follow-up with her oncologist, her nephrologist, and her hematologist. She should continue to drink plenty of fluids and stay well-hydrated She should practice good hand and respiratory hygiene Washing her hands regularly avoiding crowds of people. Follow-up with cardiology yearly as planned We will call with the CBC platelet count results as soon as those results become available

## 2018-10-13 NOTE — Progress Notes (Signed)
Subjective:    Patient ID: [name], female    DOB: 21-Dec-1933, 86 y.o.   MRN: [id number]  HPI Pt here for follow up and management of chronic medical problems which includes hypertension and hyperlipidemia. She is taking medication regularly.  The patient is doing well overall.  The patient is not a complainer and is calm despite dealing with her husband who has multiple medical issues.  This patient has had her on problems with chronic kidney disease and labile hypertension.  She sees the nephrologist and also the hematologist.  She has had breast cancer.  She has atherosclerosis of the aorta.  In addition she has paroxysmal atrial fibrillation chronic diastolic heart failure right breast cancer and has had renal artery stenosis with thrombocytopenia.  She is also on thyroid replacement.  She takes Tenoretic vitamin D replacement iron flecainide from the cardiologist levothyroxine and lisinopril 40 mg along with hydralazine 100 mg.  Since she has had breast cancer she is also on Femara.  She has had recent blood work done.  This will be reviewed with her during the visit today.  Her LDL-C was excellent and triglycerides and HDL were also good.  She will continue to watch her diet closely and stay with the atorvastatin.  The blood sugar was slightly elevated at 101.  The creatinine, was slightly elevated at 142 but similar to previous readings and we will continue to monitor that.  All of the electrolytes including potassium are good the white blood cell count remains decreased and this is why she has been seeing the hematologist.  It is 2.2 today and it has been lower than that in the past.  The hemoglobin was good at 11.6 and the platelet count was unable to be done.  All liver function tests were normal.  The vitamin D level was good at 50.6 and she will continue with current treatment    Patient Active Problem List   Diagnosis Date Noted  . Breast cancer, right ([place]) 04/08/2018  . Malignant  neoplasm of lower-outer quadrant of right breast of female, estrogen receptor positive ([place]) 02/25/2018  . Hematoma 11/27/2016  . Severe epistaxis 11/23/2016  . Vertigo 09/09/2016  . Renal vascular disease 10/31/2015  . Nodule of right lung 10/31/2015  . Asthma without status asthmaticus 10/31/2015  . Essential hypertension, malignant   . Hypothyroidism 10/15/2015  . Pancytopenia ([place]) 10/15/2015  . Hyponatremia 10/14/2015  . Thrombocytopenia ([place]) 06/16/2015  . Right renal artery stenosis ([place]) 05/09/2015  . Chronic diastolic heart failure ([place]) 04/03/2014  . Hypertensive urgency 04/02/2014  . Headache 04/02/2014  . CKD (chronic kidney disease) stage 3, GFR 30-59 ml/min (HCC) 04/02/2014  . Paroxysmal atrial fibrillation ([place]) 01/25/2014  . Hypokalemia 07/19/2013  . Anemia 07/19/2013  . Bilateral lower extremity edema 06/30/2013  . Hypertension with fluid overload 06/30/2013  . Pedal edema 05/10/2013  . Varicose veins of lower extremities with other complications 61/22/4497  . ABNORMAL STRESS ELECTROCARDIOGRAM 01/23/2010  . Hyperlipidemia 12/18/2009  . Essential hypertension 12/18/2009  . RENAL INSUFFICIENCY, CHRONIC 12/18/2009  . Arthropathy 12/18/2009   Outpatient Encounter Medications as of 10/13/2018  Medication Sig  . acetaminophen (TYLENOL) 325 MG tablet Take 325 mg by mouth daily as needed for moderate pain or headache.  Marland Kitchen atenolol-chlorthalidone (TENORETIC) 50-25 MG tablet TAKE 1 TABLET BY MOUTH DAILY.  Marland Kitchen atorvastatin (LIPITOR) 20 MG tablet TAKE 1/2 TABLET DAILY  . calcium-vitamin D (OSCAL WITH D) 500-200 MG-UNIT per tablet Take 2 tablets by mouth  at bedtime.   . cholecalciferol (VITAMIN D) 1000 units tablet Take 1,000 Units by mouth daily.  . ferrous sulfate 325 (65 FE) MG tablet Take 325 mg by mouth 2 (two) times daily with a meal.  . flecainide (TAMBOCOR) 50 MG tablet TAKE 1 TABLET (50 MG TOTAL) BY MOUTH 2 (TWO) TIMES DAILY.  . fluticasone (FLONASE) 50 MCG/ACT nasal  spray Place 1 spray into both nostrils 2 (two) times daily.   . hydrALAZINE (APRESOLINE) 100 MG tablet Take 100 mg by mouth 2 (two) times daily.  . hydroxypropyl methylcellulose / hypromellose (ISOPTO TEARS / GONIOVISC) 2.5 % ophthalmic solution Place 1 drop into both eyes daily.   Marland Kitchen letrozole (FEMARA) 2.5 MG tablet Take 1 tablet (2.5 mg total) by mouth daily.  Marland Kitchen levothyroxine (SYNTHROID, LEVOTHROID) 75 MCG tablet TAKE 1 TABLET (75 MCG TOTAL) BY MOUTH DAILY.  Marland Kitchen lisinopril (PRINIVIL,ZESTRIL) 40 MG tablet Take 1 tablet by mouth daily.  . [DISCONTINUED] guaiFENesin-dextromethorphan (ROBITUSSIN DM) 100-10 MG/5ML syrup Take 5 mLs by mouth every 4 (four) hours as needed for cough.   No facility-administered encounter medications on file as of 10/13/2018.      Review of Systems  Constitutional: Negative.   HENT: Negative.   Eyes: Negative.   Respiratory: Negative.   Cardiovascular: Negative.   Gastrointestinal: Negative.   Endocrine: Negative.   Genitourinary: Negative.   Musculoskeletal: Negative.   Skin: Negative.   Allergic/Immunologic: Negative.   Neurological: Negative.   Hematological: Negative.   Psychiatric/Behavioral: Negative.        Objective:   Physical Exam Vitals signs and nursing note reviewed.  Constitutional:      General: She is not in acute distress.    Appearance: Normal appearance. She is well-developed and normal weight. She is not ill-appearing.  HENT:     Head: Normocephalic and atraumatic.     Right Ear: Tympanic membrane, ear canal and external ear normal. There is no impacted cerumen.     Left Ear: Tympanic membrane, ear canal and external ear normal. There is no impacted cerumen.     Nose: Nose normal. No congestion.     Mouth/Throat:     Mouth: Mucous membranes are moist.     Pharynx: Oropharynx is clear. No oropharyngeal exudate.  Eyes:     General: No scleral icterus.       Right eye: No discharge.        Left eye: No discharge.     Extraocular  Movements: Extraocular movements intact.     Conjunctiva/sclera: Conjunctivae normal.     Pupils: Pupils are equal, round, and reactive to light.  Neck:     Musculoskeletal: Normal range of motion and neck supple. No neck rigidity.     Thyroid: No thyromegaly.     Vascular: Carotid bruit present. No JVD.  Cardiovascular:     Rate and Rhythm: Normal rate and regular rhythm.     Pulses: Normal pulses.     Heart sounds: Murmur present. No friction rub. No gallop.      Comments: The heart is regular at 60/min with no pedal edema and good pulses the patient has a grade 3/6 systolic ejection murmur that radiates to the neck. Pulmonary:     Effort: Pulmonary effort is normal. No respiratory distress.     Breath sounds: Normal breath sounds. No wheezing or rales.  Abdominal:     General: Abdomen is flat. Bowel sounds are normal.     Palpations: Abdomen is soft. There is no  mass.     Tenderness: There is no abdominal tenderness.  Musculoskeletal: Normal range of motion.        General: No tenderness.     Right lower leg: No edema.     Left lower leg: No edema.  Lymphadenopathy:     Cervical: No cervical adenopathy.  Skin:    General: Skin is warm and dry.     Findings: Bruising present. No rash.     Comments: Increased bruising both hands  Neurological:     General: No focal deficit present.     Mental Status: She is alert and oriented to person, place, and time. Mental status is at baseline.     Cranial Nerves: No cranial nerve deficit.     Gait: Gait normal.     Deep Tendon Reflexes: Reflexes are normal and symmetric. Reflexes normal.  Psychiatric:        Mood and Affect: Mood normal.        Behavior: Behavior normal.        Thought Content: Thought content normal.        Judgment: Judgment normal.     Comments: Mood affect and behavior for this patient are all normal for her and she is upbeat and positive despite her health conditions and her husband's health conditions.       BP (!) 143/58   Pulse (!) 54   Temp 97.8 F (36.6 C) (Oral)   Ht 5\' 6"  (1.676 m)   Wt 122 lb (55.3 kg)   BMI 19.69 kg/m       Assessment & Plan:  1. Pure hypercholesterolemia -Cholesterol numbers are good and she will continue with current treatment  2. Essential hypertension -Blood pressure is good and she will continue with current treatment  3. CKD (chronic kidney disease) stage 3, GFR 30-59 ml/min (HCC) -Continue to follow-up with Dr. Posey Pronto as planned  4. Thrombocytopenia ([place]) -Continue to follow-up with hematology as planned  5. Paroxysmal atrial fibrillation (HCC) -Continue to follow-up with cardiology as planned  6. Vitamin D deficiency -Continue with current vitamin D replacement  7. Chronic diastolic heart failure ([place]) -Follow-up with cardiology as planned  8. Pancytopenia ([place]) -Follow-up with hematology as planned  9. Senile purpura ([place]) -Patient continues to have increased bruising especially on hands.  10. Malignant neoplasm of areola of right breast in female, unspecified estrogen receptor status ([place]) -Follow-up with oncology as planned and repeat mammograms as appropriate.  No orders of the defined types were placed in this encounter.  Patient Instructions                       Medicare Annual Wellness Visit  [facility] and the medical providers at [facility] strive to bring you the best medical care.  In doing so we not only want to address your current medical conditions and concerns but also to detect new conditions early and prevent illness, disease and health-related problems.    Medicare offers a yearly Wellness Visit which allows our clinical staff to assess your need for preventative services including immunizations, lifestyle education, counseling to decrease risk of preventable diseases and screening for fall risk and other medical concerns.    This visit is provided free of charge (no copay) for all  Medicare recipients. The clinical pharmacists at [facility] have begun to conduct these Wellness Visits which will also include a thorough review of all your medications.    As  you primary medical provider recommend that you make an appointment for your Annual Wellness Visit if you have not done so already this year.  You may set up this appointment before you leave today or you may call back ([phone]) and schedule an appointment.  Please make sure when you call that you mention that you are scheduling your Annual Wellness Visit with the clinical pharmacist so that the appointment may be made for the proper length of time.     Continue current medications. Continue good therapeutic lifestyle changes which include good diet and exercise. Fall precautions discussed with patient. If an FOBT was given today- please return it to our front desk. If you are over 48 years old - you may need Prevnar 52 or the adult Pneumonia vaccine.  **Flu shots are available--- please call and schedule a FLU-CLINIC appointment**  After your visit with [place] today you will receive a survey in the mail or online from [employer] regarding your care with [place]. Please take a moment to fill this out. Your feedback is very important to [place] as you can help [place] better understand your patient needs as well as improve your experience and satisfaction. WE CARE ABOUT YOU!!!   The patient should continue to follow-up with her oncologist, her nephrologist, and her hematologist. She should continue to drink plenty of fluids and stay well-hydrated She should practice good hand and respiratory hygiene Washing her hands regularly avoiding crowds of people. Follow-up with cardiology yearly as planned We will call with the CBC platelet count results as soon as those results become available  Arrie Senate MD

## 2018-10-14 LAB — PLATELET COUNT ON CITRATED BLD: Plt Count, Citrated Bld: 82 10*3/uL — CL (ref 150–450)

## 2018-10-15 ENCOUNTER — Other Ambulatory Visit: Payer: Self-pay | Admitting: Cardiology

## 2018-10-15 ENCOUNTER — Other Ambulatory Visit: Payer: Self-pay | Admitting: Family Medicine

## 2018-10-15 DIAGNOSIS — I129 Hypertensive chronic kidney disease with stage 1 through stage 4 chronic kidney disease, or unspecified chronic kidney disease: Secondary | ICD-10-CM | POA: Diagnosis not present

## 2018-10-15 DIAGNOSIS — N183 Chronic kidney disease, stage 3 (moderate): Secondary | ICD-10-CM | POA: Diagnosis not present

## 2018-10-15 DIAGNOSIS — D649 Anemia, unspecified: Secondary | ICD-10-CM | POA: Diagnosis not present

## 2018-10-15 DIAGNOSIS — D696 Thrombocytopenia, unspecified: Secondary | ICD-10-CM | POA: Diagnosis not present

## 2018-10-18 NOTE — Telephone Encounter (Signed)
Rx(s) sent to pharmacy electronically.  

## 2018-11-12 ENCOUNTER — Ambulatory Visit (HOSPITAL_COMMUNITY): Payer: PPO | Admitting: Hematology

## 2018-11-16 ENCOUNTER — Telehealth: Payer: Self-pay | Admitting: Family Medicine

## 2018-11-16 NOTE — Telephone Encounter (Signed)
The most recent platelet count was down to 82,000.  I think we can continue to monitor this without her going into a hospital setting until it is a safer time to see the hematologist.  If possible we should repeat the CBC in a couple of weeks.  She should not come to the office if the medical crisis is worse she should stay at home and call us before she comes and then she could come to the back door and get in and get the CBC drawn and leave.  82,000 is not extremely critical but if he gets down below 50,000 is more worrisome.  We will continue to monitor this and she should repeat the CBC as mentioned in 2 to 3 weeks if it is safe to come to the office.

## 2018-11-16 NOTE — Telephone Encounter (Signed)
Pt aware - CBC ordered

## 2018-11-24 ENCOUNTER — Telehealth: Payer: Self-pay | Admitting: *Deleted

## 2018-11-24 NOTE — Telephone Encounter (Signed)
The patient verbally consented for a telehealth visit on 12/01/2018 with [facility] and understands that her insurance company will be billed for the encounter.   

## 2018-11-29 ENCOUNTER — Telehealth: Payer: Self-pay | Admitting: Family Medicine

## 2018-11-29 NOTE — Telephone Encounter (Signed)
Spoke with patient - she states Roselyn Reef asked her to call regarding coming in for labs.  Advised patient, Roselyn Reef will be in office tomorrow, and I will ask her to call back tomorrow morning to make sure correct labs are ordered.

## 2018-11-30 ENCOUNTER — Other Ambulatory Visit: Payer: Self-pay | Admitting: *Deleted

## 2018-11-30 ENCOUNTER — Other Ambulatory Visit: Payer: Self-pay

## 2018-11-30 ENCOUNTER — Other Ambulatory Visit: Payer: PPO

## 2018-11-30 DIAGNOSIS — D696 Thrombocytopenia, unspecified: Secondary | ICD-10-CM | POA: Diagnosis not present

## 2018-11-30 NOTE — Telephone Encounter (Signed)
Pt called and aware - ok to come in for labs

## 2018-11-30 NOTE — Progress Notes (Signed)
Virtual Visit via Telephone Note   This visit type was conducted due to national recommendations for restrictions regarding the COVID-19 Pandemic (e.g. social distancing) in an effort to limit this patient's exposure and mitigate transmission in our community.  Due to her co-morbid illnesses, this patient is at least at moderate risk for complications without adequate follow up.  This format is felt to be most appropriate for this patient at this time.  The patient did not have access to video technology/had technical difficulties with video requiring transitioning to audio format only (telephone).  All issues noted in this document were discussed and addressed.  No physical exam could be performed with this format.  Please refer to the patient's chart for her  consent to telehealth for [facility].   Evaluation Performed:  Follow-up visit  Date:  12/01/2018   ID:  [name], DOB 01/25/1934, MRN [id number]  Patient Location: Home Provider Location: Home  PCP:  Chipper Herb, MD  Cardiologist:  Minus Breeding, MD [facility] Electrophysiologist:  None   Chief Complaint:  Palpitations  History of Present Illness:    [name] is a 85 y.o. female with difficult to control with chronic diastolic CHF, and arrhythmias - seen by Dr Caryl Comes - left atrial tachycardias, that resolved on Flecainide.    Since she was last seen she is done well from a cardiovascular standpoint.  She continues to have thrombocytopenia and leukopenia.  She just had labs yesterday and I reviewed these with her.  Her platelet count is 88,000 which is stable from previous.  Her white blood cell count was not resulted yet but the most recent was 2.2.  She is going back to see the hematologist soon and they have tried to encourage her to proceed with a bone marrow biopsy.  We had a long conversation about the possibility of flecainide which has been reported but rarely to cause reductions in the cell lines.  We discussed  the possibility of coming off of this which has not been suggested yet by her hematologist.  She did have a marked improvement in palpitations when this medication was initiated.  She is not having any palpitations now.  She denies any chest pressure, neck or arm discomfort.  She has had no shortness of breath, PND or orthopnea.  He said no weight gain or edema.    The patient does not have symptoms concerning for COVID-19 infection (fever, chills, cough, or new shortness of breath).    Past Medical History:  Diagnosis Date  . Anemia   . Arthritis   . Asthma   . Atrial fibrillation ([place])   . CKD (chronic kidney disease), stage III ([place])   . Heart murmur   . Hyperlipidemia   . Hypertension   . Hypothyroidism   . Nodule of right lung    Right upper lobe  . PONV (postoperative nausea and vomiting)   . Renal insufficiency    Chronic  . Renal vascular disease   . Right renal artery stenosis ([place]) 05/09/2015  . Vertigo    Past Surgical History:  Procedure Laterality Date  . BREAST LUMPECTOMY WITH RADIOACTIVE SEED LOCALIZATION Right 04/08/2018   Procedure: BREAST LUMPECTOMY WITH RADIOACTIVE SEED LOCALIZATION;  Surgeon: Rolm Bookbinder, MD;  Location: [facility];  Service: General;  Laterality: Right;  . IR GENERIC HISTORICAL  08/29/2016   IR US GUIDE VASC ACCESS RIGHT 08/29/2016 [facility]INTER[facility]  . IR GENERIC HISTORICAL  08/29/2016   IR RENAL BILAT  S&I MOD SED 08/29/2016 [facility]  . KNEE ARTHROSCOPY     2019  . THYROIDECTOMY, PARTIAL Right 1981   middle lobe removed 1st, then right lobe removed 7-8 years later (approx. 1988)     Current Meds  Medication Sig  . acetaminophen (TYLENOL) 325 MG tablet Take 325 mg by mouth daily as needed for moderate pain or headache.  Marland Kitchen atenolol-chlorthalidone (TENORETIC) 50-25 MG tablet TAKE 1 TABLET BY MOUTH DAILY.  Marland Kitchen atorvastatin (LIPITOR) 20 MG tablet TAKE 1/2 TABLET DAILY  . calcium-vitamin D (OSCAL WITH D) 500-200 MG-UNIT per tablet Take 2 tablets  by mouth at bedtime.   . cholecalciferol (VITAMIN D) 1000 units tablet Take 1,000 Units by mouth daily.  . ferrous sulfate 325 (65 FE) MG tablet Take 325 mg by mouth 2 (two) times daily with a meal.  . flecainide (TAMBOCOR) 50 MG tablet Take 1 tablet (50 mg total) by mouth 2 (two) times daily.  . fluticasone (FLONASE) 50 MCG/ACT nasal spray Place 1 spray into both nostrils 2 (two) times daily.   . hydrALAZINE (APRESOLINE) 100 MG tablet Take 100 mg by mouth 2 (two) times daily.  . hydroxypropyl methylcellulose / hypromellose (ISOPTO TEARS / GONIOVISC) 2.5 % ophthalmic solution Place 1 drop into both eyes daily.   Marland Kitchen letrozole (FEMARA) 2.5 MG tablet Take 1 tablet (2.5 mg total) by mouth daily.  Marland Kitchen levothyroxine (SYNTHROID, LEVOTHROID) 75 MCG tablet TAKE 1 TABLET (75 MCG TOTAL) BY MOUTH DAILY.  Marland Kitchen lisinopril (PRINIVIL,ZESTRIL) 40 MG tablet Take 1 tablet by mouth daily.     Allergies:   Amlodipine; Isosorbide nitrate; Sulfonamide derivatives; and Terazosin   Social History   Tobacco Use  . Smoking status: Never Smoker  . Smokeless tobacco: Never Used  . Tobacco comment: Pt denies cigarettes  Substance Use Topics  . Alcohol use: No  . Drug use: No     Family Hx: The patient's family history includes Cancer in her maternal grandmother; Hypercholesterolemia in her daughter and daughter; Hypertension in her brother and daughter; Lung cancer in her maternal uncle and maternal uncle; Stroke in her father; Thyroid disease in her mother.  ROS:   Please see the history of present illness.    As stated in the HPI and negative for all other systems.   Prior CV studies:   The following studies were reviewed today:  Labs reviewed most 3 days and will look at my system this is pretty actually good from when you look at this and he was pretty fully set is positive and his parents  Labs/Other Tests and Data Reviewed:    EKG:  No ECG reviewed.  Recent Labs: 10/05/2018: ALT 8; BUN 22; Creatinine,  Ser 1.42; Hemoglobin 11.6; Platelets CANCELED; Potassium 3.7; Sodium 140   Recent Lipid Panel Lab Results  Component Value Date/Time   CHOL 138 10/05/2018 08:30 AM   CHOL 159 01/13/2013 10:42 AM   TRIG 108 10/05/2018 08:30 AM   TRIG 84 03/10/2016 09:28 AM   TRIG 142 01/13/2013 10:42 AM   HDL 52 10/05/2018 08:30 AM   HDL 56 03/10/2016 09:28 AM   HDL 54 01/13/2013 10:42 AM   CHOLHDL 2.7 10/05/2018 08:30 AM   CHOLHDL 4.6 06/05/2007 01:25 AM   LDLCALC 64 10/05/2018 08:30 AM   LDLCALC 76 03/14/2014 08:22 AM   LDLCALC 77 01/13/2013 10:42 AM    Wt Readings from Last 3 Encounters:  12/01/18 122 lb (55.3 kg)  10/13/18 122 lb (55.3 kg)  08/20/18 120 lb (  54.4 kg)     Objective:    Vital Signs:  BP (!) 110/43 (BP Location: Left Arm, Patient Position: Sitting, Cuff Size: Normal)   Pulse 61   Ht 5' 5.5" (1.664 m)   Wt 122 lb (55.3 kg)   BMI 19.99 kg/m     ASSESSMENT & PLAN:    HFpEF -      She reports no symptoms.  Continue current therapy.   Left atrial tachycardia -  She is not having palpitations.  No change in therapy.   HTN -    Her blood pressure is controlled as above.  Continue current meds.   Carotid Stenosis -     She had mild plaque in 2018 and I will consider follow labs in 2021  Thrombocytopenia/leukopenia -     We again had a long discuss about this as above.  we discussed the small possibility that this could be related with flecainide.  This has been reported.   She would likely have symptomatic recurrence of tachypalpitations but we certainly could stop this for 3 to 6 months if there is no alternative diagnosis particularly if her counts are falling.    Ao Valve Sclerosis and MR  - These are mild and can be followed clinically.    COVID-19 Education: The signs and symptoms of COVID-19 were discussed with the patient and how to seek care for testing (follow up with PCP or arrange E-visit).  The importance of social distancing was discussed today.  Time:    Today, I have spent 15 minutes with the patient with telehealth technology discussing the above problems.     Medication Adjustments/Labs and Tests Ordered: Current medicines are reviewed at length with the patient today.  Concerns regarding medicines are outlined above.   Tests Ordered: No orders of the defined types were placed in this encounter.   Medication Changes: No orders of the defined types were placed in this encounter.   Disposition:  Follow up 6 months  Signed, Minus Breeding, MD  12/01/2018 10:03 AM    [place]

## 2018-12-01 ENCOUNTER — Telehealth (INDEPENDENT_AMBULATORY_CARE_PROVIDER_SITE_OTHER): Payer: PPO | Admitting: Cardiology

## 2018-12-01 ENCOUNTER — Encounter: Payer: Self-pay | Admitting: Cardiology

## 2018-12-01 VITALS — BP 110/43 | HR 61 | Ht 65.5 in | Wt 122.0 lb

## 2018-12-01 DIAGNOSIS — D696 Thrombocytopenia, unspecified: Secondary | ICD-10-CM

## 2018-12-01 DIAGNOSIS — Z7189 Other specified counseling: Secondary | ICD-10-CM

## 2018-12-01 DIAGNOSIS — I350 Nonrheumatic aortic (valve) stenosis: Secondary | ICD-10-CM | POA: Insufficient documentation

## 2018-12-01 DIAGNOSIS — I10 Essential (primary) hypertension: Secondary | ICD-10-CM

## 2018-12-01 DIAGNOSIS — I5032 Chronic diastolic (congestive) heart failure: Secondary | ICD-10-CM | POA: Diagnosis not present

## 2018-12-01 DIAGNOSIS — R002 Palpitations: Secondary | ICD-10-CM | POA: Insufficient documentation

## 2018-12-01 LAB — PLATELET COUNT ON CITRATED BLD: Plt Count, Citrated Bld: 88 10*3/uL — CL (ref 150–450)

## 2018-12-01 MED ORDER — FLECAINIDE ACETATE 50 MG PO TABS: 50.0000 mg | ORAL_TABLET | Freq: Two times a day (BID) | ORAL | 3 refills | Status: DC

## 2018-12-01 NOTE — Patient Instructions (Signed)
Medication Instructions:  The current medical regimen is effective;  continue present plan and medications.  If you need a refill on your cardiac medications before your next appointment, please call your pharmacy.   Follow-Up: Follow up in 6 months with Dr. Hochrein in [place].  You will receive a letter in the mail 2 months before you are due.  Please call us when you receive this letter to schedule your follow up appointment.  Thank you for choosing [facility]!!     

## 2018-12-03 ENCOUNTER — Encounter (HOSPITAL_COMMUNITY): Payer: Self-pay | Admitting: Hematology

## 2018-12-03 ENCOUNTER — Inpatient Hospital Stay (HOSPITAL_COMMUNITY): Payer: PPO | Attending: Hematology | Admitting: Hematology

## 2018-12-03 DIAGNOSIS — C50511 Malignant neoplasm of lower-outer quadrant of right female breast: Secondary | ICD-10-CM | POA: Diagnosis not present

## 2018-12-03 DIAGNOSIS — D61818 Other pancytopenia: Secondary | ICD-10-CM | POA: Diagnosis not present

## 2018-12-03 DIAGNOSIS — Z79811 Long term (current) use of aromatase inhibitors: Secondary | ICD-10-CM | POA: Diagnosis not present

## 2018-12-03 DIAGNOSIS — D696 Thrombocytopenia, unspecified: Secondary | ICD-10-CM

## 2018-12-03 DIAGNOSIS — Z170 Estrogen receptor positive status [ER+]: Secondary | ICD-10-CM | POA: Diagnosis not present

## 2018-12-03 NOTE — Progress Notes (Signed)
Virtual Visit via Telephone Note  I connected with [name] on 12/03/18 at  9:15 AM EDT by telephone and verified that I am speaking with the correct person using two identifiers.   I discussed the limitations, risks, security and privacy concerns of performing an evaluation and management service by telephone and the availability of in person appointments. I also discussed with the patient that there may be a patient responsible charge related to this service. The patient expressed understanding and agreed to proceed.   History of Present Illness: 1.  Stage I right breast cancer: 2.  Pancytopenia 3.  Easy bruising 4.  Recent blood work at [facility] in February showed clumped platelets.  Repeat platelet count and sodium citrate tube was 88.    Observations/Objective: She is tolerating Femara very well.  Denies any major hot flashes.  Denies any musculoskeletal symptoms.  Denies any fevers, night sweats or weight loss in the last 6 months.  Denies any easy bleeding but has bruising on the upper and lower extremities when she bangs into things.  Denies any recent hospitalizations.   Assessment and Plan: #1 right breast cancer: - She is tolerating Femara very well.  She will continue it for a total of 5 years. - I have recommended her to undergo mammogram in June.  She will reportedly call breast imaging Center in [place] and schedule it. -I discussed the blood work with her.  I have recommended taking calcium and vitamin D twice daily.  2.  Pancytopenia: -She has leukopenia since 2014 and mild to moderate thrombocytopenia since 2015. - She had platelet count of 125 on 08/20/2018.  Repeat platelet count in February showed 88 in sodium citrate tube.  She does not have any major bleeding issues.  She does have easy bruising.  She is not on any blood thinners or aspirin. -Pancytopenia is likely from MDS.  I will check Y51, folic acid prior to next visit. - Prior to next visit, I will  check her PT, PTT along with platelet count.  Follow Up Instructions: RTC 4 months with labs 1 to 2 days prior.   I discussed the assessment and treatment plan with the patient. The patient was provided an opportunity to ask questions and all were answered. The patient agreed with the plan and demonstrated an understanding of the instructions.   The patient was advised to call back or seek an in-person evaluation if the symptoms worsen or if the condition fails to improve as anticipated.  I provided 14 minutes of non-face-to-face time during this encounter.   Derek Jack, MD

## 2018-12-03 NOTE — Assessment & Plan Note (Addendum)
1.  Stage I right breast cancer: -Right breast biopsy on 02/22/2018 consistent with IDC - Right breast lumpectomy on 04/08/2018, IDC, 1.1 cm, grade 1, margins negative, ER/PR positive, HER-2 negative, Ki-67 1%.  PT1CPNX. -Letrozole started on 05/28/2018.  She declined adjuvant RT.  2.  Pancytopenia: - Mild thrombocytopenia since 2015.  Last platelet count was 125 on 08/20/2018. -Leukopenia since 2014. -She also has normocytic anemia, last hemoglobin was 11.6.  Etiology could be from MDS as well as CKD.

## 2018-12-13 ENCOUNTER — Other Ambulatory Visit: Payer: Self-pay | Admitting: Family Medicine

## 2018-12-13 DIAGNOSIS — I10 Essential (primary) hypertension: Secondary | ICD-10-CM

## 2019-01-03 ENCOUNTER — Telehealth: Payer: Self-pay | Admitting: Family Medicine

## 2019-01-07 ENCOUNTER — Ambulatory Visit: Payer: PPO

## 2019-01-07 ENCOUNTER — Other Ambulatory Visit: Payer: Self-pay

## 2019-01-12 ENCOUNTER — Other Ambulatory Visit: Payer: Self-pay | Admitting: Family Medicine

## 2019-01-18 ENCOUNTER — Ambulatory Visit: Payer: PPO

## 2019-01-25 DIAGNOSIS — B351 Tinea unguium: Secondary | ICD-10-CM | POA: Diagnosis not present

## 2019-01-25 DIAGNOSIS — M79676 Pain in unspecified toe(s): Secondary | ICD-10-CM | POA: Diagnosis not present

## 2019-02-08 ENCOUNTER — Other Ambulatory Visit: Payer: PPO

## 2019-02-08 ENCOUNTER — Other Ambulatory Visit: Payer: Self-pay

## 2019-02-08 ENCOUNTER — Ambulatory Visit (INDEPENDENT_AMBULATORY_CARE_PROVIDER_SITE_OTHER): Payer: PPO | Admitting: Family

## 2019-02-08 ENCOUNTER — Telehealth: Payer: Self-pay | Admitting: Family Medicine

## 2019-02-08 ENCOUNTER — Encounter: Payer: Self-pay | Admitting: Family

## 2019-02-08 VITALS — BP 205/70 | HR 60 | Temp 98.2°F | Ht 65.5 in | Wt 119.8 lb

## 2019-02-08 DIAGNOSIS — E559 Vitamin D deficiency, unspecified: Secondary | ICD-10-CM

## 2019-02-08 DIAGNOSIS — D696 Thrombocytopenia, unspecified: Secondary | ICD-10-CM | POA: Diagnosis not present

## 2019-02-08 DIAGNOSIS — E7800 Pure hypercholesterolemia, unspecified: Secondary | ICD-10-CM

## 2019-02-08 DIAGNOSIS — N183 Chronic kidney disease, stage 3 unspecified: Secondary | ICD-10-CM

## 2019-02-08 DIAGNOSIS — I160 Hypertensive urgency: Secondary | ICD-10-CM

## 2019-02-08 DIAGNOSIS — I480 Paroxysmal atrial fibrillation: Secondary | ICD-10-CM

## 2019-02-08 DIAGNOSIS — S81802A Unspecified open wound, left lower leg, initial encounter: Secondary | ICD-10-CM

## 2019-02-08 DIAGNOSIS — I10 Essential (primary) hypertension: Secondary | ICD-10-CM

## 2019-02-08 MED ORDER — CEPHALEXIN 500 MG PO CAPS: 500.0000 mg | ORAL_CAPSULE | Freq: Two times a day (BID) | ORAL | 0 refills | Status: DC

## 2019-02-08 NOTE — Progress Notes (Signed)
   Subjective:    Patient ID: [name], female    DOB: [birth year], 85 y.o.   MRN: [id number]  Chief Complaint  Patient presents with  . laceration on lower leg    bumped into dishwasher door when it was open    HPI PT presents to the office today with a leg injury on left lower leg. She states over a week ago she hit her leg on the dishwasher and "scraped your leg". She states her leg is red, tender, and slightly warm. She states she was worried it was becoming infected.    Review of Systems  Skin: Positive for wound.  All other systems reviewed and are negative.      Objective:   Physical Exam Vitals signs reviewed.  Constitutional:      General: She is not in acute distress.    Appearance: She is well-developed.  Neck:     Musculoskeletal: Normal range of motion and neck supple.     Thyroid: No thyromegaly.  Cardiovascular:     Rate and Rhythm: Normal rate and regular rhythm.     Heart sounds: Murmur present.  Pulmonary:     Effort: Pulmonary effort is normal. No respiratory distress.     Breath sounds: Normal breath sounds. No wheezing.  Abdominal:     General: Bowel sounds are normal. There is no distension.     Palpations: Abdomen is soft.     Tenderness: There is no abdominal tenderness.  Musculoskeletal:        General: No tenderness.  Skin:    General: Skin is warm and dry.     Findings: Abrasion present.          Comments: 1.4X1.7 in  Neurological:     Mental Status: She is alert and oriented to person, place, and time.     Cranial Nerves: No cranial nerve deficit.     Deep Tendon Reflexes: Reflexes are normal and symmetric.  Psychiatric:        Behavior: Behavior normal.        Thought Content: Thought content normal.        Judgment: Judgment normal.       BP (!) 205/70   Pulse 60   Temp 98.2 F (36.8 C) (Oral)   Ht 5' 5.5" (1.664 m)   Wt 119 lb 12.8 oz (54.3 kg)   BMI 19.63 kg/m      Assessment & Plan:  [name] comes in  today with chief complaint of laceration on lower leg (bumped into dishwasher door when it was open)   Diagnosis and orders addressed:  1. Leg wound, left, initial encounter Keep clean and dry Start Keflex Report any increase s/s of infection - cephALEXin (KEFLEX) 500 MG capsule; Take 1 capsule (500 mg total) by mouth 2 (two) times daily.  Dispense: 21 capsule; Refill: 0  2. Hypertensive urgency PT will go home and take medications and call us with her BP at home. She states her BP is always high in the office. Denies any headaches, palpitations, or edema.     [name], FNP

## 2019-02-08 NOTE — Telephone Encounter (Signed)
Ok, much better.

## 2019-02-08 NOTE — Patient Instructions (Signed)
Wound Care, Adult  Taking care of your wound properly can help to prevent pain, infection, and scarring. It can also help your wound to heal more quickly.  How to care for your wound  Wound care          Follow instructions from your health care provider about how to take care of your wound. Make sure you:  ? Wash your hands with soap and water before you change the bandage (dressing). If soap and water are not available, use hand sanitizer.  ? Change your dressing as told by your health care provider.  ? Leave stitches (sutures), skin glue, or adhesive strips in place. These skin closures may need to stay in place for 2 weeks or longer. If adhesive strip edges start to loosen and curl up, you may trim the loose edges. Do not remove adhesive strips completely unless your health care provider tells you to do that.   Check your wound area every day for signs of infection. Check for:  ? Redness, swelling, or pain.  ? Fluid or blood.  ? Warmth.  ? Pus or a bad smell.   Ask your health care provider if you should clean the wound with mild soap and water. Doing this may include:  ? Using a clean towel to pat the wound dry after cleaning it. Do not rub or scrub the wound.  ? Applying a cream or ointment. Do this only as told by your health care provider.  ? Covering the incision with a clean dressing.   Ask your health care provider when you can leave the wound uncovered.   Keep the dressing dry until your health care provider says it can be removed. Do not take baths, swim, use a hot tub, or do anything that would put the wound underwater until your health care provider approves. Ask your health care provider if you can take showers. You may only be allowed to take sponge baths.  Medicines     If you were prescribed an antibiotic medicine, cream, or ointment, take or use the antibiotic as told by your health care provider. Do not stop taking or using the antibiotic even if your condition improves.   Take  over-the-counter and prescription medicines only as told by your health care provider. If you were prescribed pain medicine, take it 30 or more minutes before you do any wound care or as told by your health care provider.  General instructions   Return to your normal activities as told by your health care provider. Ask your health care provider what activities are safe.   Do not scratch or pick at the wound.   Do not use any products that contain nicotine or tobacco, such as cigarettes and e-cigarettes. These may delay wound healing. If you need help quitting, ask your health care provider.   Keep all follow-up visits as told by your health care provider. This is important.   Eat a diet that includes protein, vitamin A, vitamin C, and other nutrient-rich foods to help the wound heal.  ? Foods rich in protein include meat, dairy, beans, nuts, and other sources.  ? Foods rich in vitamin A include carrots and dark green, leafy vegetables.  ? Foods rich in vitamin C include citrus, tomatoes, and other fruits and vegetables.  ? Nutrient-rich foods have protein, carbohydrates, fat, vitamins, or minerals. Eat a variety of healthy foods including vegetables, fruits, and whole grains.  Contact a health care provider if:     You received a tetanus shot and you have swelling, severe pain, redness, or bleeding at the injection site.   Your pain is not controlled with medicine.   You have redness, swelling, or pain around the wound.   You have fluid or blood coming from the wound.   Your wound feels warm to the touch.   You have pus or a bad smell coming from the wound.   You have a fever or chills.   You are nauseous or you vomit.   You are dizzy.  Get help right away if:   You have a red streak going away from your wound.   The edges of the wound open up and separate.   Your wound is bleeding, and the bleeding does not stop with gentle pressure.   You have a rash.   You faint.   You have trouble  breathing.  Summary   Always wash your hands with soap and water before changing your bandage (dressing).   To help with healing, eat foods that are rich in protein, vitamin A, vitamin C, and other nutrients.   Check your wound every day for signs of infection. Contact your health care provider if you suspect that your wound is infected.  This information is not intended to replace advice given to you by your health care provider. Make sure you discuss any questions you have with your health care provider.  Document Released: 05/13/2008 Document Revised: 09/15/2017 Document Reviewed: 02/19/2016  Elsevier Interactive Patient Education  2019 [employer] Inc[employer]

## 2019-02-09 LAB — LIPID PANEL
Chol/HDL Ratio: 2.6 ratio (ref 0.0–4.4)
Cholesterol, Total: 145 mg/dL (ref 100–199)
HDL: 55 mg/dL (ref >39)
LDL Calculated: 72 mg/dL (ref 0–99)
Triglycerides: 91 mg/dL (ref 0–149)
VLDL Cholesterol Cal: 18 mg/dL (ref 5–40)

## 2019-02-09 LAB — SPECIMEN STATUS

## 2019-02-09 LAB — BMP8+EGFR
BUN/Creatinine Ratio: 16 (ref 12–28)
BUN: 25 mg/dL (ref 8–27)
CO2: 27 mmol/L (ref 20–29)
Calcium: 9.4 mg/dL (ref 8.7–10.3)
Chloride: 97 mmol/L (ref 96–106)
Creatinine, Ser: 1.53 mg/dL — ABNORMAL HIGH (ref 0.57–1.00)
GFR calc Af Amer: 36 mL/min/{1.73_m2} — ABNORMAL LOW (ref >59)
GFR calc non Af Amer: 31 mL/min/{1.73_m2} — ABNORMAL LOW (ref >59)
Glucose: 104 mg/dL — ABNORMAL HIGH (ref 65–99)
Potassium: 3.8 mmol/L (ref 3.5–5.2)
Sodium: 138 mmol/L (ref 134–144)

## 2019-02-09 LAB — HEPATIC FUNCTION PANEL
ALT: 15 IU/L (ref 0–32)
AST: 22 IU/L (ref 0–40)
Albumin: 4.3 g/dL (ref 3.6–4.6)
Alkaline Phosphatase: 67 IU/L (ref 39–117)
Bilirubin Total: 0.5 mg/dL (ref 0.0–1.2)
Bilirubin, Direct: 0.13 mg/dL (ref 0.00–0.40)
Total Protein: 7.5 g/dL (ref 6.0–8.5)

## 2019-02-09 LAB — VITAMIN D 25 HYDROXY (VIT D DEFICIENCY, FRACTURES): Vit D, 25-Hydroxy: 55.1 ng/mL (ref 30.0–100.0)

## 2019-02-09 LAB — PLATELET COUNT ON CITRATED BLD: Plt Count, Citrated Bld: 87 10*3/uL — CL (ref 150–450)

## 2019-02-16 ENCOUNTER — Other Ambulatory Visit: Payer: Self-pay | Admitting: *Deleted

## 2019-02-16 ENCOUNTER — Other Ambulatory Visit: Payer: Self-pay

## 2019-02-16 ENCOUNTER — Ambulatory Visit (INDEPENDENT_AMBULATORY_CARE_PROVIDER_SITE_OTHER): Payer: PPO | Admitting: Family Medicine

## 2019-02-16 ENCOUNTER — Encounter: Payer: Self-pay | Admitting: Family Medicine

## 2019-02-16 DIAGNOSIS — I480 Paroxysmal atrial fibrillation: Secondary | ICD-10-CM

## 2019-02-16 DIAGNOSIS — D696 Thrombocytopenia, unspecified: Secondary | ICD-10-CM | POA: Diagnosis not present

## 2019-02-16 DIAGNOSIS — N183 Chronic kidney disease, stage 3 unspecified: Secondary | ICD-10-CM

## 2019-02-16 DIAGNOSIS — C50911 Malignant neoplasm of unspecified site of right female breast: Secondary | ICD-10-CM

## 2019-02-16 DIAGNOSIS — E034 Atrophy of thyroid (acquired): Secondary | ICD-10-CM | POA: Diagnosis not present

## 2019-02-16 DIAGNOSIS — Z780 Asymptomatic menopausal state: Secondary | ICD-10-CM

## 2019-02-16 DIAGNOSIS — C50011 Malignant neoplasm of nipple and areola, right female breast: Secondary | ICD-10-CM

## 2019-02-16 DIAGNOSIS — I5032 Chronic diastolic (congestive) heart failure: Secondary | ICD-10-CM | POA: Diagnosis not present

## 2019-02-16 DIAGNOSIS — E782 Mixed hyperlipidemia: Secondary | ICD-10-CM | POA: Diagnosis not present

## 2019-02-16 DIAGNOSIS — I10 Essential (primary) hypertension: Secondary | ICD-10-CM

## 2019-02-16 DIAGNOSIS — D692 Other nonthrombocytopenic purpura: Secondary | ICD-10-CM

## 2019-02-16 NOTE — Progress Notes (Signed)
Virtual Visit Via telephone Note I connected with@ on 02/16/19 by telephone and verified that I am speaking with the correct person or authorized healthcare agent using two identifiers. [name] is currently located at home and there are no unauthorized people in close proximity. I completed this visit while in a private location in my home .  This visit type was conducted due to national recommendations for restrictions regarding the COVID-19 Pandemic (e.g. social distancing).  This format is felt to be most appropriate for this patient at this time.  All issues noted in this document were discussed and addressed.  No physical exam was performed.    I discussed the limitations, risks, security and privacy concerns of performing an evaluation and management service by telephone and the availability of in person appointments. I also discussed with the patient that there may be a patient responsible charge related to this service. The patient expressed understanding and agreed to proceed.   Date:  02/16/2019    ID:  [name]      1934/06/12        [id number]   Patient Care Team Patient Care Team: Chipper Herb, MD as PCP - General (Family Medicine) Minus Breeding, MD as PCP - Cardiology (Cardiology) Rolm Bookbinder, MD as Consulting Physician (General Surgery) Eppie Gibson, MD as Attending Physician (Radiation Oncology) Truitt Merle, MD as Consulting Physician (Hematology)  Reason for Visit: Primary Care Follow-up     History of Present Illness & Review of Systems:     [name] is a 85 y.o. year old female primary care patient that presents today for a telehealth visit.  The patient is doing well overall at home.  She does feel fatigued and tired and questions if the Tenoretic could be playing a role and her blood pressures have been excellent.  She denies any chest pain pressure tightness or shortness of breath anymore than usual.  She denies any trouble with  swallowing heartburn indigestion nausea vomiting diarrhea blood in the stool black tarry bowel movements or change in bowel habits.  She is passing her water well and trying to drink plenty of fluids.  She keeps her regular appointments with her nephrologist and her cardiologist and is currently seeing the hematologist, Dr. Delton Coombes.  She will double check with Delton Coombes and make sure that he is following up on her breast cancer also and the medications that were ordered by Dr. Walden Field.  We will try to get a DEXA scan and make sure that Delton Coombes gets a copy of this report along with Dr. Walden Field.  The surgeon that did her surgery was Dr. Donne Hazel.  Review of systems as stated, otherwise negative.  The patient does not have symptoms concerning for COVID-19 infection (fever, chills, cough, or new shortness of breath).      Current Medications (Verified) Allergies as of 02/16/2019      Reactions   Amlodipine Other (See Comments)   edema   Isosorbide Nitrate Nausea Only   Sulfonamide Derivatives Nausea Only, Rash   Terazosin Hives, Nausea Only      Medication List       Accurate as of February 16, 2019 11:08 AM. If you have any questions, ask your nurse or doctor.        acetaminophen 325 MG tablet Commonly known as: TYLENOL Take 325 mg by mouth daily as needed for moderate pain or headache.   atenolol-chlorthalidone 50-25 MG tablet Commonly known as: TENORETIC  TAKE 1 TABLET BY MOUTH EVERY DAY   atorvastatin 20 MG tablet Commonly known as: LIPITOR TAKE 1/2 TABLET DAILY   calcium-vitamin D 500-200 MG-UNIT tablet Commonly known as: OSCAL WITH D Take 2 tablets by mouth at bedtime.   cephALEXin 500 MG capsule Commonly known as: KEFLEX Take 1 capsule (500 mg total) by mouth 2 (two) times daily.   cholecalciferol 1000 units tablet Commonly known as: VITAMIN D Take 1,000 Units by mouth daily.   ferrous sulfate 325 (65 FE) MG tablet Take 325 mg by mouth 2 (two) times daily with a meal.    flecainide 50 MG tablet Commonly known as: TAMBOCOR Take 1 tablet (50 mg total) by mouth 2 (two) times daily.   fluticasone 50 MCG/ACT nasal spray Commonly known as: FLONASE Place 1 spray into both nostrils 2 (two) times daily.   hydrALAZINE 100 MG tablet Commonly known as: APRESOLINE Take 100 mg by mouth 2 (two) times daily.   hydroxypropyl methylcellulose / hypromellose 2.5 % ophthalmic solution Commonly known as: ISOPTO TEARS / GONIOVISC Place 1 drop into both eyes daily.   letrozole 2.5 MG tablet Commonly known as: FEMARA Take 1 tablet (2.5 mg total) by mouth daily.   levothyroxine 75 MCG tablet Commonly known as: SYNTHROID TAKE 1 TABLET BY MOUTH EVERY DAY   lisinopril 40 MG tablet Commonly known as: ZESTRIL Take 1 tablet by mouth daily.           Allergies (Verified)    Amlodipine, Isosorbide nitrate, Sulfonamide derivatives, and Terazosin  Past Medical History Past Medical History:  Diagnosis Date  . Anemia   . Arthritis   . Asthma   . Atrial fibrillation ([place])   . CKD (chronic kidney disease), stage III ([place])   . Heart murmur   . Hyperlipidemia   . Hypertension   . Hypothyroidism   . Nodule of right lung    Right upper lobe  . PONV (postoperative nausea and vomiting)   . Renal insufficiency    Chronic  . Renal vascular disease   . Right renal artery stenosis ([place]) 05/09/2015  . Vertigo      Past Surgical History:  Procedure Laterality Date  . BREAST LUMPECTOMY WITH RADIOACTIVE SEED LOCALIZATION Right 04/08/2018   Procedure: BREAST LUMPECTOMY WITH RADIOACTIVE SEED LOCALIZATION;  Surgeon: Rolm Bookbinder, MD;  Location: [facility];  Service: General;  Laterality: Right;  . IR GENERIC HISTORICAL  08/29/2016   IR US GUIDE VASC ACCESS RIGHT 08/29/2016 [facility]  . IR GENERIC HISTORICAL  08/29/2016   IR RENAL BILAT S&I MOD SED 08/29/2016 [facility]  . KNEE ARTHROSCOPY     2019  . THYROIDECTOMY, PARTIAL Right 1981   middle lobe removed 1st, then  right lobe removed 7-8 years later (approx. 49)    Social History   Socioeconomic History  . Marital status: Married    Spouse name: Not on file  . Number of children: 2  . Years of education: 70  . Highest education level: Not on file  Occupational History  . Occupation: Retired  Scientific laboratory technician  . Financial resource strain: Not hard at all  . Food insecurity    Worry: Never true    Inability: Never true  . Transportation needs    Medical: No    Non-medical: No  Tobacco Use  . Smoking status: Never Smoker  . Smokeless tobacco: Never Used  . Tobacco comment: Pt denies cigarettes  Substance and Sexual Activity  . Alcohol use: No  .  Drug use: No  . Sexual activity: Never  Lifestyle  . Physical activity    Days per week: 2 days    Minutes per session: 30 min  . Stress: Not at all  Relationships  . Social connections    Talks on phone: More than three times a week    Gets together: More than three times a week    Attends religious service: More than 4 times per year    Active member of club or organization: Yes    Attends meetings of clubs or organizations: More than 4 times per year    Relationship status: Married  Other Topics Concern  . Not on file  Social History Narrative   Lives at home with husband.   Ambidextrous (writes with left hand).   No caffeine use.     Family History  Problem Relation Age of Onset  . Thyroid disease Mother   . Stroke Father   . Hypertension Brother   . Lung cancer Maternal Uncle   . Cancer Maternal Grandmother        Liver  . Lung cancer Maternal Uncle   . Hypertension Daughter   . Hypercholesterolemia Daughter   . Hypercholesterolemia Daughter       Labs/Other Tests and Data Reviewed:    Wt Readings from Last 3 Encounters:  02/08/19 119 lb 12.8 oz (54.3 kg)  12/01/18 122 lb (55.3 kg)  10/13/18 122 lb (55.3 kg)   Temp Readings from Last 3 Encounters:  02/08/19 98.2 F (36.8 C) (Oral)  10/13/18 97.8 F (36.6 C)  (Oral)  08/20/18 98.6 F (37 C) (Oral)   BP Readings from Last 3 Encounters:  02/08/19 (!) 205/70  12/01/18 (!) 110/43  10/13/18 (!) 143/58   Pulse Readings from Last 3 Encounters:  02/08/19 60  12/01/18 61  10/13/18 (!) 54     Lab Results  Component Value Date   HGBA1C 5.0 02/22/2015   Lab Results  Component Value Date   LDLCALC 72 02/08/2019   CREATININE 1.53 (H) 02/08/2019       Chemistry      Component Value Date/Time   NA 138 02/08/2019 1345   K 3.8 02/08/2019 1345   CL 97 02/08/2019 1345   CO2 27 02/08/2019 1345   BUN 25 02/08/2019 1345   CREATININE 1.53 (H) 02/08/2019 1345   CREATININE 1.57 (H) 03/03/2018 1214   CREATININE 1.37 (H) 02/22/2013 1133      Component Value Date/Time   CALCIUM 9.4 02/08/2019 1345   ALKPHOS 67 02/08/2019 1345   AST 22 02/08/2019 1345   AST 19 03/03/2018 1214   ALT 15 02/08/2019 1345   ALT 14 03/03/2018 1214   BILITOT 0.5 02/08/2019 1345   BILITOT 0.5 03/03/2018 1214         OBSERVATIONS/ OBJECTIVE:     Patient is calm and alert and seems to have a good handle on her doctors visits and help issue problems.  She says she is needing to get a DEXA scan.  We reviewed her previous lab work and I did not see a thyroid on there and will make sure that we can add 1 to this we will if we cannot we will ask her to get a thyroid profile at her next visit.  She is up-to-date on her eye exams.  Her blood pressures have been great running between 123-128/49-53.  She has had some blood pressures at home as low as 824 systolic.  Her weight is 120  pounds.  She does not have any edema.  Her pulse rates have been between 49 and 52.  Physical exam deferred due to nature of telephonic visit.  ASSESSMENT & PLAN    Time:   Today, I have spent 30 minutes with the patient via telephone discussing the above including Covid precautions.     Visit Diagnoses: 1. Malignant neoplasm of right female breast, unspecified estrogen receptor status,  unspecified site of breast ([place]) -Continue to follow-up with hematology/oncology and Dr. Donne Hazel as needed  2. Thrombocytopenia ([place]) -Follow-up with hematology and Dr. Delton Coombes, recent platelet count was 87,000  3. Mixed hyperlipidemia -Continue with current treatment of atorvastatin and aggressive therapeutic lifestyle changes as much as possible for her age  80. CKD (chronic kidney disease) stage 3, GFR 30-59 ml/min ([facility]) -Continue to follow-up with Dr. Posey Pronto, her nephrologist  5. Hypothyroidism due to acquired atrophy of thyroid -Check thyroid at next visit  6. Essential hypertension -Check blood pressures regularly.  Current blood pressure readings have been very good at home  7. Chronic diastolic heart failure ([place]) -Follow up with cardiology as planned  8. Paroxysmal atrial fibrillation ([facility]) -Follow-up with Dr. Percival Spanish and cardiology as planned  9. Senile purpura ([facility]) -Continue to follow-up with hematology and monitor platelet counts  10. Malignant neoplasm of areola of right breast in female, unspecified estrogen receptor status ([place]) -Get mammogram as planned and follow-up with hematology/oncology and stay on current medicines prescribed for breast cancer -Get DEXA scan and forward a copy of this result to patient's hematologist/oncologist.  Patient Instructions  Continue to follow-up with nephrologist hematologist oncologist and cardiology as planned Continue to practice good hand and respiratory hygiene Continue to drink plenty of water and fluids and stay well-hydrated Continue to make every effort to not put self at risk for falling and avoid climbing Stay active as physically possible Arrange for patient to have DEXA scan Send copy to Dr. Walden Field and Delton Coombes of DEXA scan The patient feels tired a lot.  She says that her pulse rates run in the 40s and 50s.  Her blood pressures have been excellent recently at home.  She will try reducing her Tenoretic  slightly and take a half of 1 on Monday Wednesday and Friday and a whole one on the other days and check her pulse rates and blood pressures very closely.  If her blood pressures go back up she will resume her previous treatment with the full pill otherwise she will see if she can reduce this somewhat to see if she can have more energy.  She will discuss this particular treatment regimen with her cardiologist when she sees him next.       The above assessment and management plan was discussed with the patient. The patient verbalized understanding of and has agreed to the management plan. Patient is aware to call the clinic if symptoms persist or worsen. Patient is aware when to return to the clinic for a follow-up visit. Patient educated on when it is appropriate to go to the emergency department.    Chipper Herb, MD [address] [facility] [address], [place], [place] [address] Ph [phone]   Arrie Senate MD

## 2019-02-16 NOTE — Patient Instructions (Addendum)
Continue to follow-up with nephrologist hematologist oncologist and cardiology as planned Continue to practice good hand and respiratory hygiene Continue to drink plenty of water and fluids and stay well-hydrated Continue to make every effort to not put self at risk for falling and avoid climbing Stay active as physically possible Arrange for patient to have DEXA scan Send copy to Dr. Walden Field and Delton Coombes of DEXA scan The patient feels tired a lot.  She says that her pulse rates run in the 40s and 50s.  Her blood pressures have been excellent recently at home.  She will try reducing her Tenoretic slightly and take a half of 1 on Monday Wednesday and Friday and a whole one on the other days and check her pulse rates and blood pressures very closely.  If her blood pressures go back up she will resume her previous treatment with the full pill otherwise she will see if she can reduce this somewhat to see if she can have more energy.  She will discuss this particular treatment regimen with her cardiologist when she sees him next.

## 2019-02-17 ENCOUNTER — Other Ambulatory Visit (INDEPENDENT_AMBULATORY_CARE_PROVIDER_SITE_OTHER): Payer: PPO

## 2019-02-17 ENCOUNTER — Other Ambulatory Visit: Payer: Self-pay

## 2019-02-17 DIAGNOSIS — Z780 Asymptomatic menopausal state: Secondary | ICD-10-CM | POA: Diagnosis not present

## 2019-02-24 ENCOUNTER — Inpatient Hospital Stay (HOSPITAL_COMMUNITY): Payer: PPO | Attending: Hematology

## 2019-02-24 ENCOUNTER — Other Ambulatory Visit: Payer: Self-pay

## 2019-02-24 DIAGNOSIS — M1990 Unspecified osteoarthritis, unspecified site: Secondary | ICD-10-CM | POA: Diagnosis not present

## 2019-02-24 DIAGNOSIS — D649 Anemia, unspecified: Secondary | ICD-10-CM | POA: Diagnosis not present

## 2019-02-24 DIAGNOSIS — R011 Cardiac murmur, unspecified: Secondary | ICD-10-CM | POA: Diagnosis not present

## 2019-02-24 DIAGNOSIS — Z79899 Other long term (current) drug therapy: Secondary | ICD-10-CM | POA: Diagnosis not present

## 2019-02-24 DIAGNOSIS — I129 Hypertensive chronic kidney disease with stage 1 through stage 4 chronic kidney disease, or unspecified chronic kidney disease: Secondary | ICD-10-CM | POA: Diagnosis not present

## 2019-02-24 DIAGNOSIS — I4891 Unspecified atrial fibrillation: Secondary | ICD-10-CM | POA: Insufficient documentation

## 2019-02-24 DIAGNOSIS — N183 Chronic kidney disease, stage 3 (moderate): Secondary | ICD-10-CM | POA: Diagnosis not present

## 2019-02-24 DIAGNOSIS — E039 Hypothyroidism, unspecified: Secondary | ICD-10-CM | POA: Insufficient documentation

## 2019-02-24 DIAGNOSIS — Z79811 Long term (current) use of aromatase inhibitors: Secondary | ICD-10-CM | POA: Diagnosis not present

## 2019-02-24 DIAGNOSIS — C50511 Malignant neoplasm of lower-outer quadrant of right female breast: Secondary | ICD-10-CM | POA: Insufficient documentation

## 2019-02-24 DIAGNOSIS — Z170 Estrogen receptor positive status [ER+]: Secondary | ICD-10-CM | POA: Diagnosis not present

## 2019-02-24 DIAGNOSIS — E785 Hyperlipidemia, unspecified: Secondary | ICD-10-CM | POA: Insufficient documentation

## 2019-02-24 DIAGNOSIS — D696 Thrombocytopenia, unspecified: Secondary | ICD-10-CM

## 2019-02-24 LAB — COMPREHENSIVE METABOLIC PANEL
ALT: 13 U/L (ref 0–44)
AST: 21 U/L (ref 15–41)
Albumin: 4.1 g/dL (ref 3.5–5.0)
Alkaline Phosphatase: 60 U/L (ref 38–126)
Anion gap: 10 (ref 5–15)
BUN: 35 mg/dL — ABNORMAL HIGH (ref 8–23)
CO2: 28 mmol/L (ref 22–32)
Calcium: 9.4 mg/dL (ref 8.9–10.3)
Chloride: 97 mmol/L — ABNORMAL LOW (ref 98–111)
Creatinine, Ser: 1.72 mg/dL — ABNORMAL HIGH (ref 0.44–1.00)
GFR calc Af Amer: 31 mL/min — ABNORMAL LOW (ref >60)
GFR calc non Af Amer: 27 mL/min — ABNORMAL LOW (ref >60)
Glucose, Bld: 174 mg/dL — ABNORMAL HIGH (ref 70–99)
Potassium: 3.5 mmol/L (ref 3.5–5.1)
Sodium: 135 mmol/L (ref 135–145)
Total Bilirubin: 0.8 mg/dL (ref 0.3–1.2)
Total Protein: 7.5 g/dL (ref 6.5–8.1)

## 2019-02-24 LAB — APTT: aPTT: 41 seconds — ABNORMAL HIGH (ref 24–36)

## 2019-02-24 LAB — CBC WITH DIFFERENTIAL/PLATELET
Abs Immature Granulocytes: 0.02 10*3/uL (ref 0.00–0.07)
Basophils Absolute: 0 10*3/uL (ref 0.0–0.1)
Basophils Relative: 0 %
Eosinophils Absolute: 0 10*3/uL (ref 0.0–0.5)
Eosinophils Relative: 1 %
HCT: 32.2 % — ABNORMAL LOW (ref 36.0–46.0)
Hemoglobin: 10.4 g/dL — ABNORMAL LOW (ref 12.0–15.0)
Immature Granulocytes: 1 %
Lymphocytes Relative: 24 %
Lymphs Abs: 0.6 10*3/uL — ABNORMAL LOW (ref 0.7–4.0)
MCH: 28.7 pg (ref 26.0–34.0)
MCHC: 32.3 g/dL (ref 30.0–36.0)
MCV: 89 fL (ref 80.0–100.0)
Monocytes Absolute: 0.3 10*3/uL (ref 0.1–1.0)
Monocytes Relative: 10 %
Neutro Abs: 1.7 10*3/uL (ref 1.7–7.7)
Neutrophils Relative %: 64 %
Platelets: 119 10*3/uL — ABNORMAL LOW (ref 150–400)
RBC: 3.62 MIL/uL — ABNORMAL LOW (ref 3.87–5.11)
RDW: 13.6 % (ref 11.5–15.5)
WBC: 2.6 10*3/uL — ABNORMAL LOW (ref 4.0–10.5)
nRBC: 0 % (ref 0.0–0.2)

## 2019-02-24 LAB — IRON AND TIBC
Iron: 39 ug/dL (ref 28–170)
Saturation Ratios: 14 % (ref 10.4–31.8)
TIBC: 279 ug/dL (ref 250–450)
UIBC: 240 ug/dL

## 2019-02-24 LAB — FERRITIN: Ferritin: 251 ng/mL (ref 11–307)

## 2019-02-24 LAB — FOLATE: Folate: 13.8 ng/mL (ref >5.9)

## 2019-02-24 LAB — PROTIME-INR
INR: 1 (ref 0.8–1.2)
Prothrombin Time: 13.1 seconds (ref 11.4–15.2)

## 2019-02-24 LAB — VITAMIN B12: Vitamin B-12: 400 pg/mL (ref 180–914)

## 2019-02-25 ENCOUNTER — Ambulatory Visit (HOSPITAL_COMMUNITY): Payer: PPO | Admitting: Hematology

## 2019-02-28 LAB — METHYLMALONIC ACID, SERUM: Methylmalonic Acid, Quantitative: 1072 nmol/L — ABNORMAL HIGH (ref 0–378)

## 2019-03-01 ENCOUNTER — Telehealth: Payer: Self-pay | Admitting: Family Medicine

## 2019-03-01 NOTE — Telephone Encounter (Signed)
It does not appear that her test has been read yet.  I will send to Barnett Applebaum to follow up.  We'll probably have to call the radiologist to see if this can be read.  Will contact once results available.

## 2019-03-04 ENCOUNTER — Inpatient Hospital Stay (HOSPITAL_BASED_OUTPATIENT_CLINIC_OR_DEPARTMENT_OTHER): Payer: PPO | Admitting: Nurse Practitioner

## 2019-03-04 ENCOUNTER — Other Ambulatory Visit: Payer: Self-pay

## 2019-03-04 DIAGNOSIS — Z170 Estrogen receptor positive status [ER+]: Secondary | ICD-10-CM | POA: Diagnosis not present

## 2019-03-04 DIAGNOSIS — C50511 Malignant neoplasm of lower-outer quadrant of right female breast: Secondary | ICD-10-CM

## 2019-03-04 DIAGNOSIS — Z79811 Long term (current) use of aromatase inhibitors: Secondary | ICD-10-CM | POA: Diagnosis not present

## 2019-03-04 DIAGNOSIS — D649 Anemia, unspecified: Secondary | ICD-10-CM

## 2019-03-04 NOTE — Assessment & Plan Note (Addendum)
1.  Stage I right breast cancer: -She had a mammogram on 02/16/2018 which showed B RADS category 0 incomplete.  She was referred for a biopsy. - Right breast biopsy on 02/22/2018 consistent with IDC. -Right breast lumpectomy on 04/08/2018, IDC, 1.1 cm, grade 1, margins negative, ER/PR positive, HER-2 negative, Ki-67 1% -Letrozole started on 05/28/2018.  She is tolerating this well. -She declined adjuvant RT. -Labs on 02/24/2019 showed hemoglobin 10.4, platelets 119, WBC 2.6 -Her mammogram for 2020 is scheduled next Friday, 03/11/2019.  She has her mammograms done with [facility].  They will fax [place] a copy. - She will follow-up in 3 months with repeat labs.  2.  Pancytopenia: -Mild thrombocytopenia since 2015.  Leukopenia since 2014. She also has normocytic anemia. - Etiology could be from MDS as well as CKD. - Labs on 02/24/2019 showed her potassium 3.5, creatinine 1.72, WBC 2.6, hemoglobin 10.4, platelets 119.  Ferritin 251, percent saturation 14 -I have recommended 2 infusions of IV Feraheme. -We will recheck labs in 3 months.  3.  DEXA scan was completed last week however it is will not be read until this week.

## 2019-03-04 NOTE — Progress Notes (Signed)
[address], [place] [address]   CLINIC:  Medical Oncology/Hematology  PCP:  [name], DO [place] [id number] [phone]   REASON FOR VISIT: Follow-up for breast cancer and pancytopenia  CURRENT THERAPY: Letrozole  BRIEF ONCOLOGIC HISTORY:  Oncology History Overview Note  Cancer Staging Malignant neoplasm of lower-outer quadrant of right breast of female, estrogen receptor positive ([place]) Staging form: Breast, AJCC 8th Edition - Clinical stage from 02/22/2018: Stage IA (cT1c, cN0, cM0, G1, ER+, PR+, HER2-) - Signed by Truitt Merle, MD on 03/02/2018    Malignant neoplasm of lower-outer quadrant of right breast of female, estrogen receptor positive ([place])  02/12/2018 Mammogram   She had routine screening bilateral mammography on 02/12/2018 at [facility] with results showing: indeterminate irregular mass in the right breast.    02/16/2018 Mammogram   She underwent right diagnostic mammography with tomography and right breast ultrasonography at [facility] on 02/16/2018 showing: 1.1 cm irregular mass at the 8 O'clock position on the right breast   02/22/2018 Pathology Results   Diagnosis 1. Breast, right, needle core biopsy, lateral, 8 o'clock - INVASIVE AND IN SITU DUCTAL CARCINOMA. - SEE COMMENT. 2. Breast, right, needle core biopsy, 11 o'clock - FIBROCYSTIC CHANGES. - USUAL DUCTAL HYPERPLASIA. - THERE IS NO EVIDENCE OF MALIGNANCY.   02/22/2018 Receptors her2   IMMUNOHISTOCHEMICAL AND MORPHOMETRIC ANALYSIS PERFORMED MANUALLY Estrogen Receptor: 100%, POSITIVE, STRONG STAINING INTENSITY Progesterone Receptor: 40%, POSITIVE, MODERATE STAINING INTENSITY Proliferation Marker Ki67: 1% Her2 Negative   02/22/2018 Cancer Staging   Staging form: Breast, AJCC 8th Edition - Clinical stage from 02/22/2018: Stage IA (cT1c, cN0, cM0, G1, ER+, PR+, HER2-) - Signed by Truitt Merle, MD on 03/02/2018    02/25/2018 Initial Diagnosis   Malignant neoplasm of  lower-outer quadrant of right breast of female, estrogen receptor positive ([place])      CANCER STAGING: Cancer Staging Malignant neoplasm of lower-outer quadrant of right breast of female, estrogen receptor positive ([place]) Staging form: Breast, AJCC 8th Edition - Clinical stage from 02/22/2018: Stage IA (cT1c, cN0, cM0, G1, ER+, PR+, HER2-) - Signed by Truitt Merle, MD on 03/02/2018    INTERVAL HISTORY:  [name] 85 y.o. female returns for routine follow-up for breast cancer and pancytopenia.  Patient reports she has been doing well since her last visit.  She does have easy bruising. Denies any nausea, vomiting, or diarrhea. Denies any new pains. Had not noticed any recent bleeding such as epistaxis, hematuria or hematochezia. Denies recent chest pain on exertion, shortness of breath on minimal exertion, pre-syncopal episodes, or palpitations. Denies any numbness or tingling in hands or feet. Denies any recent fevers, infections, or recent hospitalizations. Patient reports appetite at 75% and energy level at 50%.  She is eating well maintaining her weight is time.    REVIEW OF SYSTEMS:  Review of Systems  Hematological: Bruises/bleeds easily.  All other systems reviewed and are negative.    PAST MEDICAL/SURGICAL HISTORY:  Past Medical History:  Diagnosis Date  . Anemia   . Arthritis   . Asthma   . Atrial fibrillation ([place])   . CKD (chronic kidney disease), stage III ([place])   . Heart murmur   . Hyperlipidemia   . Hypertension   . Hypothyroidism   . Nodule of right lung    Right upper lobe  . PONV (postoperative nausea and vomiting)   . Renal insufficiency    Chronic  . Renal vascular disease   .  Right renal artery stenosis ([place]) 05/09/2015  . Vertigo    Past Surgical History:  Procedure Laterality Date  . BREAST LUMPECTOMY WITH RADIOACTIVE SEED LOCALIZATION Right 04/08/2018   Procedure: BREAST LUMPECTOMY WITH RADIOACTIVE SEED LOCALIZATION;  Surgeon: Rolm Bookbinder, MD;   Location: [facility];  Service: General;  Laterality: Right;  . IR GENERIC HISTORICAL  08/29/2016   IR US GUIDE VASC ACCESS RIGHT 08/29/2016 [facility]  . IR GENERIC HISTORICAL  08/29/2016   IR RENAL BILAT S&I MOD SED 08/29/2016 [facility]  . KNEE ARTHROSCOPY     2019  . THYROIDECTOMY, PARTIAL Right 1981   middle lobe removed 1st, then right lobe removed 7-8 years later (approx. 1988)     SOCIAL HISTORY:  Social History   Socioeconomic History  . Marital status: Married    Spouse name: Not on file  . Number of children: 2  . Years of education: 75  . Highest education level: Not on file  Occupational History  . Occupation: Retired  Scientific laboratory technician  . Financial resource strain: Not hard at all  . Food insecurity    Worry: Never true    Inability: Never true  . Transportation needs    Medical: No    Non-medical: No  Tobacco Use  . Smoking status: Never Smoker  . Smokeless tobacco: Never Used  . Tobacco comment: Pt denies cigarettes  Substance and Sexual Activity  . Alcohol use: No  . Drug use: No  . Sexual activity: Never  Lifestyle  . Physical activity    Days per week: 2 days    Minutes per session: 30 min  . Stress: Not at all  Relationships  . Social connections    Talks on phone: More than three times a week    Gets together: More than three times a week    Attends religious service: More than 4 times per year    Active member of club or organization: Yes    Attends meetings of clubs or organizations: More than 4 times per year    Relationship status: Married  . Intimate partner violence    Fear of current or ex partner: No    Emotionally abused: No    Physically abused: No    Forced sexual activity: No  Other Topics Concern  . Not on file  Social History Narrative   Lives at home with husband.   Ambidextrous (writes with left hand).   No caffeine use.    FAMILY HISTORY:  Family History  Problem Relation Age of Onset  . Thyroid disease Mother   .  Stroke Father   . Hypertension Brother   . Lung cancer Maternal Uncle   . Cancer Maternal Grandmother        Liver  . Lung cancer Maternal Uncle   . Hypertension Daughter   . Hypercholesterolemia Daughter   . Hypercholesterolemia Daughter     CURRENT MEDICATIONS:  Outpatient Encounter Medications as of 03/04/2019  Medication Sig Note  . acetaminophen (TYLENOL) 325 MG tablet Take 325 mg by mouth daily as needed for moderate pain or headache.   Marland Kitchen atenolol-chlorthalidone (TENORETIC) 50-25 MG tablet TAKE 1 TABLET BY MOUTH EVERY DAY   . atorvastatin (LIPITOR) 20 MG tablet TAKE 1/2 TABLET DAILY   . calcium-vitamin D (OSCAL WITH D) 500-200 MG-UNIT per tablet Take 2 tablets by mouth at bedtime.    . cholecalciferol (VITAMIN D) 1000 units tablet Take 1,000 Units by mouth daily.   . ferrous sulfate 325 (  65 FE) MG tablet Take 325 mg by mouth 2 (two) times daily with a meal.   . flecainide (TAMBOCOR) 50 MG tablet Take 1 tablet (50 mg total) by mouth 2 (two) times daily.   . fluticasone (FLONASE) 50 MCG/ACT nasal spray Place 1 spray into both nostrils 2 (two) times daily.    . hydrALAZINE (APRESOLINE) 100 MG tablet Take 100 mg by mouth 2 (two) times daily.   . hydroxypropyl methylcellulose / hypromellose (ISOPTO TEARS / GONIOVISC) 2.5 % ophthalmic solution Place 1 drop into both eyes daily.    Marland Kitchen letrozole (FEMARA) 2.5 MG tablet Take 1 tablet (2.5 mg total) by mouth daily.   Marland Kitchen levothyroxine (SYNTHROID) 75 MCG tablet TAKE 1 TABLET BY MOUTH EVERY DAY   . lisinopril (PRINIVIL,ZESTRIL) 40 MG tablet Take 1 tablet by mouth daily. 02/08/2019: Blood  pressure was low.  . [DISCONTINUED] cephALEXin (KEFLEX) 500 MG capsule Take 1 capsule (500 mg total) by mouth 2 (two) times daily. (Patient not taking: Reported on 02/16/2019)    No facility-administered encounter medications on file as of 03/04/2019.     ALLERGIES:  Allergies  Allergen Reactions  . Amlodipine Other (See Comments)    edema  . Isosorbide  Nitrate Nausea Only  . Sulfonamide Derivatives Nausea Only and Rash  . Terazosin Hives and Nausea Only     PHYSICAL EXAM:  ECOG Performance status: 1  Vitals:   03/04/19 1144  BP: (!) 176/71  Pulse: (!) 58  Resp: 18  Temp: 97.8 F (36.6 C)  SpO2: 96%   Filed Weights   03/04/19 1144  Weight: 119 lb 12.8 oz (54.3 kg)    Physical Exam Constitutional:      Appearance: Normal appearance. She is normal weight.  Cardiovascular:     Rate and Rhythm: Normal rate and regular rhythm.     Heart sounds: Normal heart sounds.  Pulmonary:     Effort: Pulmonary effort is normal.     Breath sounds: Normal breath sounds.  Abdominal:     General: Bowel sounds are normal.     Palpations: Abdomen is soft.  Musculoskeletal: Normal range of motion.  Skin:    General: Skin is warm and dry.  Neurological:     Mental Status: She is alert and oriented to person, place, and time. Mental status is at baseline.  Psychiatric:        Mood and Affect: Mood normal.        Behavior: Behavior normal.        Thought Content: Thought content normal.        Judgment: Judgment normal.      LABORATORY DATA:  I have reviewed the labs as listed.  CBC    Component Value Date/Time   WBC 2.6 (L) 02/24/2019 1320   RBC 3.62 (L) 02/24/2019 1320   HGB 10.4 (L) 02/24/2019 1320   HGB WILL FOLLOW 02/08/2019 1345   HCT 32.2 (L) 02/24/2019 1320   HCT WILL FOLLOW 02/08/2019 1345   PLT 119 (L) 02/24/2019 1320   PLT WILL FOLLOW 02/08/2019 1345   MCV 89.0 02/24/2019 1320   MCV WILL FOLLOW 02/08/2019 1345   MCH 28.7 02/24/2019 1320   MCHC 32.3 02/24/2019 1320   RDW 13.6 02/24/2019 1320   RDW WILL FOLLOW 02/08/2019 1345   LYMPHSABS 0.6 (L) 02/24/2019 1320   LYMPHSABS WILL FOLLOW 02/08/2019 1345   MONOABS 0.3 02/24/2019 1320   EOSABS 0.0 02/24/2019 1320   EOSABS WILL FOLLOW 02/08/2019 1345   BASOSABS  0.0 02/24/2019 1320   BASOSABS WILL FOLLOW 02/08/2019 1345   CMP Latest Ref Rng & Units 02/24/2019  02/08/2019 10/05/2018  Glucose 70 - 99 mg/dL 174(H) 104(H) 101(H)  BUN 8 - 23 mg/dL 35(H) 25 22  Creatinine 0.44 - 1.00 mg/dL 1.72(H) 1.53(H) 1.42(H)  Sodium 135 - 145 mmol/L 135 138 140  Potassium 3.5 - 5.1 mmol/L 3.5 3.8 3.7  Chloride 98 - 111 mmol/L 97(L) 97 96  CO2 22 - 32 mmol/L '28 27 28  ' Calcium 8.9 - 10.3 mg/dL 9.4 9.4 9.4  Total Protein 6.5 - 8.1 g/dL 7.5 7.5 6.9  Total Bilirubin 0.3 - 1.2 mg/dL 0.8 0.5 0.4  Alkaline Phos 38 - 126 U/L 60 67 59  AST 15 - 41 U/L '21 22 20  ' ALT 0 - 44 U/L '13 15 8     ' I personally performed a face-to-face visit.  All questions were answered to patient's stated satisfaction. Encouraged patient to call with any new concerns or questions before his next visit to the cancer center and we can certain see him sooner, if needed.     ASSESSMENT & PLAN:   Malignant neoplasm of lower-outer quadrant of right breast of female, estrogen receptor positive ([place]) 1.  Stage I right breast cancer: -She had a mammogram on 02/16/2018 which showed B RADS category 0 incomplete.  She was referred for a biopsy. - Right breast biopsy on 02/22/2018 consistent with IDC. -Right breast lumpectomy on 04/08/2018, IDC, 1.1 cm, grade 1, margins negative, ER/PR positive, HER-2 negative, Ki-67 1% -Letrozole started on 05/28/2018.  She is tolerating this well. -She declined adjuvant RT. -Labs on 02/24/2019 showed hemoglobin 10.4, platelets 119, WBC 2.6 -Her mammogram for 2020 is scheduled next Friday, 03/11/2019.  She has her [facility]ograms done with [facility].  They will fax [place] a copy. - She will follow-up in 3 months with repeat labs.  2.  Pancytopenia: -Mild thrombocytopenia since 2015.  Leukopenia since 2014. She also has normocytic anemia. - Etiology could be from MDS as well as CKD. - Labs on 02/24/2019 showed her potassium 3.5, creatinine 1.72, WBC 2.6, hemoglobin 10.4, platelets 119.  Ferritin 251, percent saturation 14 -I have recommended 2 infusions of IV Feraheme. -We will recheck labs  in 3 months.  3.  DEXA scan was completed last week however it is will not be read until this week.      Orders placed this encounter:  Orders Placed This Encounter  Procedures  . Lactate dehydrogenase  . CBC with Differential/Platelet  . Comprehensive metabolic panel  . Ferritin  . Iron and TIBC  . Vitamin B12  . VITAMIN D 25 Hydroxy (Vit-D Deficiency, Fractures)  . Folate      Francene Finders, FNP-C [facility] [phone]

## 2019-03-04 NOTE — Telephone Encounter (Signed)
Please inform patient that there was a glitch during the initial test transmission to the radiologist.  Staff is working on getting this fixed.  We will call her with results as soon as I receive the test read.

## 2019-03-04 NOTE — Patient Instructions (Signed)
[facility] Discharge Instructions  Follow up in 3 months with labs    Thank you for choosing [facility] to provide your oncology and hematology care.  To afford each patient quality time with our provider, please arrive at least 15 minutes before your scheduled appointment time.   If you have a lab appointment with the Cancer Center please come in thru the  Main Entrance and check in at the main information desk  You need to re-schedule your appointment should you arrive 10 or more minutes late.  We strive to give you quality time with our providers, and arriving late affects you and other patients whose appointments are after yours.  Also, if you no show three or more times for appointments you may be dismissed from the clinic at the providers discretion.     Again, thank you for choosing [facility].  Our hope is that these requests will decrease the amount of time that you wait before being seen by our physicians.       _____________________________________________________________  Should you have questions after your visit to [facility], please contact our office at [phone] between the hours of 8:00 a.m. and 4:30 p.m.  Voicemails left after 4:00 p.m. will not be returned until the following business day.  For prescription refill requests, have your pharmacy contact our office and allow 72 hours.    Cancer Center Support Programs:   > Cancer Support Group  2nd Tuesday of the month 1pm-2pm, Journey Room    

## 2019-03-04 NOTE — Telephone Encounter (Signed)
Pt notified of problem

## 2019-03-08 ENCOUNTER — Ambulatory Visit (HOSPITAL_COMMUNITY): Payer: PPO

## 2019-03-11 ENCOUNTER — Inpatient Hospital Stay (HOSPITAL_COMMUNITY): Payer: PPO

## 2019-03-11 ENCOUNTER — Ambulatory Visit (HOSPITAL_COMMUNITY): Payer: PPO

## 2019-03-11 ENCOUNTER — Other Ambulatory Visit: Payer: Self-pay

## 2019-03-11 VITALS — BP 186/52 | HR 68 | Temp 98.5°F | Resp 18

## 2019-03-11 DIAGNOSIS — N183 Chronic kidney disease, stage 3 unspecified: Secondary | ICD-10-CM

## 2019-03-11 DIAGNOSIS — D649 Anemia, unspecified: Secondary | ICD-10-CM

## 2019-03-11 DIAGNOSIS — C50511 Malignant neoplasm of lower-outer quadrant of right female breast: Secondary | ICD-10-CM | POA: Diagnosis not present

## 2019-03-11 MED ORDER — SODIUM CHLORIDE 0.9 % IV SOLN
Freq: Once | INTRAVENOUS | Status: AC
  Administered 2019-03-11: 11:00:00 via INTRAVENOUS

## 2019-03-11 MED ORDER — SODIUM CHLORIDE 0.9 % IV SOLN
510.0000 mg | Freq: Once | INTRAVENOUS | Status: AC
  Administered 2019-03-11: 510 mg via INTRAVENOUS
  Filled 2019-03-11: qty 510

## 2019-03-11 NOTE — Patient Instructions (Signed)
[facility] at [facility]  Discharge Instructions:  Feraheme infusion received today.  Ferumoxytol injection What is this medicine? FERUMOXYTOL is an iron complex. Iron is used to make healthy red blood cells, which carry oxygen and nutrients throughout the body. This medicine is used to treat iron deficiency anemia. This medicine may be used for other purposes; ask your health care provider or pharmacist if you have questions. COMMON BRAND NAME(S): Feraheme What should I tell my health care provider before I take this medicine? They need to know if you have any of these conditions:  anemia not caused by low iron levels  high levels of iron in the blood  magnetic resonance imaging (MRI) test scheduled  an unusual or allergic reaction to iron, other medicines, foods, dyes, or preservatives  pregnant or trying to get pregnant  breast-feeding How should I use this medicine? This medicine is for injection into a vein. It is given by a health care professional in a hospital or clinic setting. Talk to your pediatrician regarding the use of this medicine in children. Special care may be needed. Overdosage: If you think you have taken too much of this medicine contact a poison control center or emergency room at once. NOTE: This medicine is only for you. Do not share this medicine with others. What if I miss a dose? It is important not to miss your dose. Call your doctor or health care professional if you are unable to keep an appointment. What may interact with this medicine? This medicine may interact with the following medications:  other iron products This list may not describe all possible interactions. Give your health care provider a list of all the medicines, herbs, non-prescription drugs, or dietary supplements you use. Also tell them if you smoke, drink alcohol, or use illegal drugs. Some items may interact with your medicine. What should I watch for  while using this medicine? Visit your doctor or healthcare professional regularly. Tell your doctor or healthcare professional if your symptoms do not start to get better or if they get worse. You may need blood work done while you are taking this medicine. You may need to follow a special diet. Talk to your doctor. Foods that contain iron include: whole grains/cereals, dried fruits, beans, or peas, leafy green vegetables, and organ meats (liver, kidney). What side effects may I notice from receiving this medicine? Side effects that you should report to your doctor or health care professional as soon as possible:  allergic reactions like skin rash, itching or hives, swelling of the face, lips, or tongue  breathing problems  changes in blood pressure  feeling faint or lightheaded, falls  fever or chills  flushing, sweating, or hot feelings  swelling of the ankles or feet Side effects that usually do not require medical attention (report to your doctor or health care professional if they continue or are bothersome):  diarrhea  headache  nausea, vomiting  stomach pain This list may not describe all possible side effects. Call your doctor for medical advice about side effects. You may report side effects to FDA at 1-800-FDA-1088. Where should I keep my medicine? This drug is given in a hospital or clinic and will not be stored at home. NOTE: This sheet is a summary. It may not cover all possible information. If you have questions about this medicine, talk to your doctor, pharmacist, or health care provider.  2020 Elsevier/Gold Standard (2016-09-22 20:21:10)  _______________________________________________________________  Thank you for choosing  [place] at [facility] to provide your oncology and hematology care.  To afford each patient quality time with our providers, please arrive at least 15 minutes before your scheduled appointment.  You need to  re-schedule your appointment if you arrive 10 or more minutes late.  We strive to give you quality time with our providers, and arriving late affects you and other patients whose appointments are after yours.  Also, if you no show three or more times for appointments you may be dismissed from the clinic.  Again, thank you for choosing [place] at [place] hope is that these requests will allow you access to exceptional care and in a timely manner. _______________________________________________________________  If you have questions after your visit, please contact our office at [phone] between the hours of 8:30 a.m. and 5:00 p.m. Voicemails left after 4:30 p.m. will not be returned until the following business day. _______________________________________________________________  For prescription refill requests, have your pharmacy contact our office. _______________________________________________________________  Recommendations made by the consultant and any test results will be sent to your referring physician. _______________________________________________________________

## 2019-03-11 NOTE — Progress Notes (Signed)
1045 Pts BP elevated at 200/53 at arrival. [facility] after patient has settled is 188/55. Francene Finders, NP made aware. Pt states she took her BP meds this morning and that her BP is always elevated when she goes to the doctor. She states when she checks it at home it is normally in the 120s. Per Francene Finders, NP proceed with iron infusion today and recheck BP prior to discharge.  Kirstie Mirza Damon tolerated iron infusion without incident or complaint. BP remains elevated at 186/52. Francene Finders, NP made aware. Pt states that she does not want to take anything for it because she knows it will get better when she leaves. I let patient know to recheck it at home and if it is still elevated that she should report to the ER. Pt verbalized understanding. Discharged in satisfactory condition.

## 2019-03-15 ENCOUNTER — Ambulatory Visit (HOSPITAL_COMMUNITY): Payer: PPO

## 2019-03-17 DIAGNOSIS — Z853 Personal history of malignant neoplasm of breast: Secondary | ICD-10-CM | POA: Diagnosis not present

## 2019-03-18 ENCOUNTER — Inpatient Hospital Stay (HOSPITAL_COMMUNITY): Payer: PPO

## 2019-03-18 ENCOUNTER — Other Ambulatory Visit: Payer: Self-pay

## 2019-03-18 ENCOUNTER — Encounter (HOSPITAL_COMMUNITY): Payer: Self-pay

## 2019-03-18 VITALS — BP 139/41 | HR 60 | Temp 98.6°F | Resp 16

## 2019-03-18 DIAGNOSIS — D649 Anemia, unspecified: Secondary | ICD-10-CM

## 2019-03-18 DIAGNOSIS — C50511 Malignant neoplasm of lower-outer quadrant of right female breast: Secondary | ICD-10-CM | POA: Diagnosis not present

## 2019-03-18 DIAGNOSIS — N183 Chronic kidney disease, stage 3 unspecified: Secondary | ICD-10-CM

## 2019-03-18 MED ORDER — SODIUM CHLORIDE 0.9 % IV SOLN
510.0000 mg | Freq: Once | INTRAVENOUS | Status: AC
  Administered 2019-03-18: 510 mg via INTRAVENOUS
  Filled 2019-03-18: qty 17

## 2019-03-18 MED ORDER — SODIUM CHLORIDE 0.9% FLUSH
10.0000 mL | Freq: Once | INTRAVENOUS | Status: AC | PRN
  Administered 2019-03-18: 10 mL

## 2019-03-18 MED ORDER — SODIUM CHLORIDE 0.9 % IV SOLN
Freq: Once | INTRAVENOUS | Status: AC
  Administered 2019-03-18: 13:00:00 via INTRAVENOUS

## 2019-03-18 NOTE — Progress Notes (Signed)
Patient tolerated iron infusion with no complaints voiced.  Peripheral IV site with good blood return noted before and after infusion.  No bruising or swelling noted at site and patient denied pain.  Band aid applied.  VSS with discharge and left ambulatory with no s/s of distress noted.  

## 2019-03-29 ENCOUNTER — Ambulatory Visit (INDEPENDENT_AMBULATORY_CARE_PROVIDER_SITE_OTHER): Payer: PPO | Admitting: Nurse Practitioner

## 2019-03-29 ENCOUNTER — Other Ambulatory Visit: Payer: Self-pay

## 2019-03-29 ENCOUNTER — Encounter: Payer: Self-pay | Admitting: Nurse Practitioner

## 2019-03-29 VITALS — BP 162/55 | HR 57 | Temp 98.6°F | Ht 65.0 in | Wt 119.0 lb

## 2019-03-29 DIAGNOSIS — S81811A Laceration without foreign body, right lower leg, initial encounter: Secondary | ICD-10-CM

## 2019-03-29 DIAGNOSIS — M8588 Other specified disorders of bone density and structure, other site: Secondary | ICD-10-CM | POA: Diagnosis not present

## 2019-03-29 DIAGNOSIS — Z780 Asymptomatic menopausal state: Secondary | ICD-10-CM | POA: Diagnosis not present

## 2019-03-29 DIAGNOSIS — M85852 Other specified disorders of bone density and structure, left thigh: Secondary | ICD-10-CM | POA: Diagnosis not present

## 2019-03-29 MED ORDER — CIPROFLOXACIN HCL 500 MG PO TABS: 500.0000 mg | ORAL_TABLET | Freq: Two times a day (BID) | ORAL | 0 refills | Status: DC

## 2019-03-29 MED ORDER — CEPHALEXIN 500 MG PO CAPS: 500.0000 mg | ORAL_CAPSULE | Freq: Two times a day (BID) | ORAL | 0 refills | Status: DC
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
## 2019-03-29 NOTE — Addendum Note (Signed)
Addended by: Chevis Pretty on: 03/29/2019 01:44 PM   Modules accepted: Orders

## 2019-03-29 NOTE — Patient Instructions (Signed)
Wound Care, Adult Taking care of your wound properly can help to prevent pain, infection, and scarring. It can also help your wound to heal more quickly. How to care for your wound Wound care      Follow instructions from your health care provider about how to take care of your wound. Make sure you: ? Wash your hands with soap and water before you change the bandage (dressing). If soap and water are not available, use hand sanitizer. ? Change your dressing as told by your health care provider. ? Leave stitches (sutures), skin glue, or adhesive strips in place. These skin closures may need to stay in place for 2 weeks or longer. If adhesive strip edges start to loosen and curl up, you may trim the loose edges. Do not remove adhesive strips completely unless your health care provider tells you to do that.  Check your wound area every day for signs of infection. Check for: ? Redness, swelling, or pain. ? Fluid or blood. ? Warmth. ? Pus or a bad smell.  Ask your health care provider if you should clean the wound with mild soap and water. Doing this may include: ? Using a clean towel to pat the wound dry after cleaning it. Do not rub or scrub the wound. ? Applying a cream or ointment. Do this only as told by your health care provider. ? Covering the incision with a clean dressing.  Ask your health care provider when you can leave the wound uncovered.  Keep the dressing dry until your health care provider says it can be removed. Do not take baths, swim, use a hot tub, or do anything that would put the wound underwater until your health care provider approves. Ask your health care provider if you can take showers. You may only be allowed to take sponge baths. Medicines   If you were prescribed an antibiotic medicine, cream, or ointment, take or use the antibiotic as told by your health care provider. Do not stop taking or using the antibiotic even if your condition improves.  Take  over-the-counter and prescription medicines only as told by your health care provider. If you were prescribed pain medicine, take it 30 or more minutes before you do any wound care or as told by your health care provider. General instructions  Return to your normal activities as told by your health care provider. Ask your health care provider what activities are safe.  Do not scratch or pick at the wound.  Do not use any products that contain nicotine or tobacco, such as cigarettes and e-cigarettes. These may delay wound healing. If you need help quitting, ask your health care provider.  Keep all follow-up visits as told by your health care provider. This is important.  Eat a diet that includes protein, vitamin A, vitamin C, and other nutrient-rich foods to help the wound heal. ? Foods rich in protein include meat, dairy, beans, nuts, and other sources. ? Foods rich in vitamin A include carrots and dark green, leafy vegetables. ? Foods rich in vitamin C include citrus, tomatoes, and other fruits and vegetables. ? Nutrient-rich foods have protein, carbohydrates, fat, vitamins, or minerals. Eat a variety of healthy foods including vegetables, fruits, and whole grains. Contact a health care provider if:  You received a tetanus shot and you have swelling, severe pain, redness, or bleeding at the injection site.  Your pain is not controlled with medicine.  You have redness, swelling, or pain around the wound.    You have fluid or blood coming from the wound.  Your wound feels warm to the touch.  You have pus or a bad smell coming from the wound.  You have a fever or chills.  You are nauseous or you vomit.  You are dizzy. Get help right away if:  You have a red streak going away from your wound.  The edges of the wound open up and separate.  Your wound is bleeding, and the bleeding does not stop with gentle pressure.  You have a rash.  You faint.  You have trouble breathing.  Summary  Always wash your hands with soap and water before changing your bandage (dressing).  To help with healing, eat foods that are rich in protein, vitamin A, vitamin C, and other nutrients.  Check your wound every day for signs of infection. Contact your health care provider if you suspect that your wound is infected. This information is not intended to replace advice given to you by your health care provider. Make sure you discuss any questions you have with your health care provider. Document Released: 05/13/2008 Document Revised: 11/22/2018 Document Reviewed: 02/19/2016 Elsevier Patient Education  2020 Elsevier Inc.  

## 2019-04-10 ENCOUNTER — Other Ambulatory Visit: Payer: Self-pay | Admitting: Family Medicine

## 2019-04-16 ENCOUNTER — Other Ambulatory Visit: Payer: Self-pay | Admitting: Family Medicine

## 2019-04-19 ENCOUNTER — Encounter: Payer: Self-pay | Admitting: Family

## 2019-04-19 ENCOUNTER — Ambulatory Visit (INDEPENDENT_AMBULATORY_CARE_PROVIDER_SITE_OTHER): Payer: PPO | Admitting: Family

## 2019-04-19 ENCOUNTER — Other Ambulatory Visit: Payer: Self-pay

## 2019-04-19 DIAGNOSIS — R112 Nausea with vomiting, unspecified: Secondary | ICD-10-CM

## 2019-04-19 MED ORDER — ONDANSETRON HCL 4 MG PO TABS: 4.0000 mg | ORAL_TABLET | Freq: Three times a day (TID) | ORAL | 0 refills | Status: DC | PRN

## 2019-04-19 NOTE — Progress Notes (Signed)
   Virtual Visit via telephone Note Due to COVID-19 pandemic this visit was conducted virtually. This visit type was conducted due to national recommendations for restrictions regarding the COVID-19 Pandemic (e.g. social distancing, sheltering in place) in an effort to limit this patient's exposure and mitigate transmission in our community. All issues noted in this document were discussed and addressed.  A physical exam was not performed with this format.  I connected with [name] on 04/19/19 at 2:13 pm by telephone and verified that I am speaking with the correct person using two identifiers. [name] is currently located at home and no one is currently with her during visit. The provider, [name], FNP is located in their office at time of visit.  I discussed the limitations, risks, security and privacy concerns of performing an evaluation and management service by telephone and the availability of in person appointments. I also discussed with the patient that there may be a patient responsible charge related to this service. The patient expressed understanding and agreed to proceed.   History and Present Illness:  Pt calls the office today with nausea that started about three weeks, and states she has intermittent vomiting. She states she can not think of anything that makes it worse. Denies any GERD symptoms, diarrhea, fever, cough,  Emesis  This is a recurrent problem. The current episode started 1 to 4 weeks ago. The problem occurs 2 to 4 times per day. The problem has been waxing and waning. There has been no fever. Pertinent negatives include no chills, coughing, diarrhea, fever, headaches, myalgias or sweats. She has tried bed rest and increased fluids for the symptoms. The treatment provided mild relief.      Review of Systems  Constitutional: Negative for chills and fever.  Respiratory: Negative for cough.   Gastrointestinal: Positive for vomiting. Negative for  diarrhea.  Musculoskeletal: Negative for myalgias.  Neurological: Negative for headaches.  All other systems reviewed and are negative.    Observations/Objective: No SOB or distress noted   Assessment and Plan: 1. Nausea and vomiting, intractability of vomiting not specified, unspecified vomiting type Bland diet Labs pending Zofran as needed If nausea continues will need work, denies any GERD, COIVD, or viral symptoms  Keep follow up with PCP - ondansetron (ZOFRAN) 4 MG tablet; Take 1 tablet (4 mg total) by mouth every 8 (eight) hours as needed for nausea or vomiting.  Dispense: 20 tablet; Refill: 0 - CBC with Differential/Platelet - CMP14+EGFR     I discussed the assessment and treatment plan with the patient. The patient was provided an opportunity to ask questions and all were answered. The patient agreed with the plan and demonstrated an understanding of the instructions.   The patient was advised to call back or seek an in-person evaluation if the symptoms worsen or if the condition fails to improve as anticipated.  The above assessment and management plan was discussed with the patient. The patient verbalized understanding of and has agreed to the management plan. Patient is aware to call the clinic if symptoms persist or worsen. Patient is aware when to return to the clinic for a follow-up visit. Patient educated on when it is appropriate to go to the emergency department.   Time call ended:  2:28 pm  I provided 15 minutes of non-face-to-face time during this encounter.    [name], FNP

## 2019-04-20 LAB — CBC WITH DIFFERENTIAL/PLATELET
Basophils Absolute: 0 10*3/uL (ref 0.0–0.2)
Basos: 0 %
EOS (ABSOLUTE): 0 10*3/uL (ref 0.0–0.4)
Eos: 0 %
Hematocrit: 37.2 % (ref 34.0–46.6)
Hemoglobin: 12.1 g/dL (ref 11.1–15.9)
Immature Grans (Abs): 0 10*3/uL (ref 0.0–0.1)
Immature Granulocytes: 0 %
Lymphocytes Absolute: 0.6 10*3/uL — ABNORMAL LOW (ref 0.7–3.1)
Lymphs: 21 %
MCH: 29.2 pg (ref 26.6–33.0)
MCHC: 32.5 g/dL (ref 31.5–35.7)
MCV: 90 fL (ref 79–97)
Monocytes Absolute: 0.2 10*3/uL (ref 0.1–0.9)
Monocytes: 7 %
Neutrophils Absolute: 2 10*3/uL (ref 1.4–7.0)
Neutrophils: 72 %
Platelets: 80 10*3/uL — CL (ref 150–450)
RBC: 4.15 x10E6/uL (ref 3.77–5.28)
RDW: 14.1 % (ref 11.7–15.4)
WBC: 2.9 10*3/uL — ABNORMAL LOW (ref 3.4–10.8)

## 2019-04-20 LAB — CMP14+EGFR
ALT: 11 IU/L (ref 0–32)
AST: 20 IU/L (ref 0–40)
Albumin/Globulin Ratio: 1.4 (ref 1.2–2.2)
Albumin: 4.6 g/dL (ref 3.6–4.6)
Alkaline Phosphatase: 62 IU/L (ref 39–117)
BUN/Creatinine Ratio: 14 (ref 12–28)
BUN: 21 mg/dL (ref 8–27)
Bilirubin Total: 0.4 mg/dL (ref 0.0–1.2)
CO2: 29 mmol/L (ref 20–29)
Calcium: 10.1 mg/dL (ref 8.7–10.3)
Chloride: 97 mmol/L (ref 96–106)
Creatinine, Ser: 1.5 mg/dL — ABNORMAL HIGH (ref 0.57–1.00)
GFR calc Af Amer: 37 mL/min/{1.73_m2} — ABNORMAL LOW (ref >59)
GFR calc non Af Amer: 32 mL/min/{1.73_m2} — ABNORMAL LOW (ref >59)
Globulin, Total: 3.2 g/dL (ref 1.5–4.5)
Glucose: 127 mg/dL — ABNORMAL HIGH (ref 65–99)
Potassium: 3.9 mmol/L (ref 3.5–5.2)
Sodium: 139 mmol/L (ref 134–144)
Total Protein: 7.8 g/dL (ref 6.0–8.5)

## 2019-04-20 LAB — PLATELET COUNT ON CITRATED BLD: Plt Count, Citrated Bld: 78 10*3/uL — CL (ref 150–450)

## 2019-04-29 ENCOUNTER — Telehealth (HOSPITAL_COMMUNITY): Payer: Self-pay | Admitting: *Deleted

## 2019-04-29 NOTE — Telephone Encounter (Signed)
Pt called into to clinic stating she wanted to stop taking letrozole for a week to see if this medication was causing her to be nauseous. Spoke with provider and provider stated for her not to stop taking this medication. Provider stated for the patient to try taking with food or take at night or take Zofran 30 minutes taking this medicine. Provider also stated letrozole will reduce risk of breast cancer coming back. Called pt to let her know what provider stated and pt stated she didn't think a week would increase the risk of her cancer coming back. Placed the pt on hold and spoke with provider about what the pt had stated. Provider stated she could stop for a week but provider stated she was against her stopping. Spoke with pt about what provider stated and pt stated she would continue to take letrozole without stopping.

## 2019-05-05 ENCOUNTER — Telehealth: Payer: Self-pay | Admitting: Family Medicine

## 2019-05-05 NOTE — Telephone Encounter (Signed)
[place] is normal.  We will get a copy of the results sent to the patient if she'd like for her records.

## 2019-05-05 NOTE — Telephone Encounter (Signed)
I have no idea what this DEXA has not been read yet.  Last I was informed in July that there was a glitch in the system and that it would be taken care of for patient.  Please apologize to patient.  I will try and figure out where the gap is.  Will see if Loma Sousa can call to radiology today and inquire as to how we can get this scan read ASAP.

## 2019-05-05 NOTE — Telephone Encounter (Signed)
Ms. Signor called today stating that she would like you to call her regarding the DEXA scan she had on 02/17/19. She has called multiple times but has not been able to get the results. Thank you!

## 2019-05-05 NOTE — Telephone Encounter (Signed)
Patient aware.

## 2019-05-11 NOTE — Telephone Encounter (Signed)
Will make note to look over this.

## 2019-05-16 ENCOUNTER — Other Ambulatory Visit: Payer: PPO

## 2019-05-16 ENCOUNTER — Other Ambulatory Visit: Payer: Self-pay

## 2019-05-16 DIAGNOSIS — N183 Chronic kidney disease, stage 3 (moderate): Secondary | ICD-10-CM | POA: Diagnosis not present

## 2019-05-23 DIAGNOSIS — I129 Hypertensive chronic kidney disease with stage 1 through stage 4 chronic kidney disease, or unspecified chronic kidney disease: Secondary | ICD-10-CM | POA: Diagnosis not present

## 2019-05-23 DIAGNOSIS — R634 Abnormal weight loss: Secondary | ICD-10-CM | POA: Diagnosis not present

## 2019-05-23 DIAGNOSIS — D696 Thrombocytopenia, unspecified: Secondary | ICD-10-CM | POA: Diagnosis not present

## 2019-05-23 DIAGNOSIS — N183 Chronic kidney disease, stage 3 unspecified: Secondary | ICD-10-CM | POA: Diagnosis not present

## 2019-05-23 DIAGNOSIS — D649 Anemia, unspecified: Secondary | ICD-10-CM | POA: Diagnosis not present

## 2019-05-27 ENCOUNTER — Inpatient Hospital Stay (HOSPITAL_COMMUNITY): Payer: PPO | Attending: Nurse Practitioner

## 2019-05-27 ENCOUNTER — Other Ambulatory Visit: Payer: Self-pay

## 2019-05-27 DIAGNOSIS — E785 Hyperlipidemia, unspecified: Secondary | ICD-10-CM | POA: Insufficient documentation

## 2019-05-27 DIAGNOSIS — N183 Chronic kidney disease, stage 3 unspecified: Secondary | ICD-10-CM | POA: Diagnosis not present

## 2019-05-27 DIAGNOSIS — Z79811 Long term (current) use of aromatase inhibitors: Secondary | ICD-10-CM | POA: Diagnosis not present

## 2019-05-27 DIAGNOSIS — D649 Anemia, unspecified: Secondary | ICD-10-CM | POA: Diagnosis not present

## 2019-05-27 DIAGNOSIS — D61818 Other pancytopenia: Secondary | ICD-10-CM | POA: Insufficient documentation

## 2019-05-27 DIAGNOSIS — I4891 Unspecified atrial fibrillation: Secondary | ICD-10-CM | POA: Insufficient documentation

## 2019-05-27 DIAGNOSIS — C50511 Malignant neoplasm of lower-outer quadrant of right female breast: Secondary | ICD-10-CM | POA: Insufficient documentation

## 2019-05-27 DIAGNOSIS — M1990 Unspecified osteoarthritis, unspecified site: Secondary | ICD-10-CM | POA: Diagnosis not present

## 2019-05-27 DIAGNOSIS — E039 Hypothyroidism, unspecified: Secondary | ICD-10-CM | POA: Insufficient documentation

## 2019-05-27 DIAGNOSIS — I129 Hypertensive chronic kidney disease with stage 1 through stage 4 chronic kidney disease, or unspecified chronic kidney disease: Secondary | ICD-10-CM | POA: Insufficient documentation

## 2019-05-27 DIAGNOSIS — Z79899 Other long term (current) drug therapy: Secondary | ICD-10-CM | POA: Diagnosis not present

## 2019-05-27 DIAGNOSIS — Z170 Estrogen receptor positive status [ER+]: Secondary | ICD-10-CM | POA: Insufficient documentation

## 2019-05-27 DIAGNOSIS — R011 Cardiac murmur, unspecified: Secondary | ICD-10-CM | POA: Insufficient documentation

## 2019-05-27 LAB — CBC WITH DIFFERENTIAL/PLATELET
Abs Immature Granulocytes: 0.01 10*3/uL (ref 0.00–0.07)
Basophils Absolute: 0 10*3/uL (ref 0.0–0.1)
Basophils Relative: 0 %
Eosinophils Absolute: 0 10*3/uL (ref 0.0–0.5)
Eosinophils Relative: 1 %
HCT: 35 % — ABNORMAL LOW (ref 36.0–46.0)
Hemoglobin: 11.8 g/dL — ABNORMAL LOW (ref 12.0–15.0)
Immature Granulocytes: 0 %
Lymphocytes Relative: 27 %
Lymphs Abs: 0.8 10*3/uL (ref 0.7–4.0)
MCH: 30.9 pg (ref 26.0–34.0)
MCHC: 33.7 g/dL (ref 30.0–36.0)
MCV: 91.6 fL (ref 80.0–100.0)
Monocytes Absolute: 0.3 10*3/uL (ref 0.1–1.0)
Monocytes Relative: 10 %
Neutro Abs: 1.7 10*3/uL (ref 1.7–7.7)
Neutrophils Relative %: 62 %
Platelets: 114 10*3/uL — ABNORMAL LOW (ref 150–400)
RBC: 3.82 MIL/uL — ABNORMAL LOW (ref 3.87–5.11)
RDW: 13.4 % (ref 11.5–15.5)
WBC: 2.8 10*3/uL — ABNORMAL LOW (ref 4.0–10.5)
nRBC: 0 % (ref 0.0–0.2)

## 2019-05-27 LAB — COMPREHENSIVE METABOLIC PANEL
ALT: 14 U/L (ref 0–44)
AST: 22 U/L (ref 15–41)
Albumin: 4.2 g/dL (ref 3.5–5.0)
Alkaline Phosphatase: 60 U/L (ref 38–126)
Anion gap: 10 (ref 5–15)
BUN: 36 mg/dL — ABNORMAL HIGH (ref 8–23)
CO2: 29 mmol/L (ref 22–32)
Calcium: 9.4 mg/dL (ref 8.9–10.3)
Chloride: 99 mmol/L (ref 98–111)
Creatinine, Ser: 1.43 mg/dL — ABNORMAL HIGH (ref 0.44–1.00)
GFR calc Af Amer: 39 mL/min — ABNORMAL LOW (ref >60)
GFR calc non Af Amer: 34 mL/min — ABNORMAL LOW (ref >60)
Glucose, Bld: 110 mg/dL — ABNORMAL HIGH (ref 70–99)
Potassium: 3.5 mmol/L (ref 3.5–5.1)
Sodium: 138 mmol/L (ref 135–145)
Total Bilirubin: 0.6 mg/dL (ref 0.3–1.2)
Total Protein: 7.8 g/dL (ref 6.5–8.1)

## 2019-05-27 LAB — VITAMIN B12: Vitamin B-12: 412 pg/mL (ref 180–914)

## 2019-05-27 LAB — FOLATE: Folate: 14.7 ng/mL (ref >5.9)

## 2019-05-27 LAB — VITAMIN D 25 HYDROXY (VIT D DEFICIENCY, FRACTURES): Vit D, 25-Hydroxy: 62.4 ng/mL (ref 30–100)

## 2019-05-27 LAB — IRON AND TIBC
Iron: 54 ug/dL (ref 28–170)
Saturation Ratios: 20 % (ref 10.4–31.8)
TIBC: 277 ug/dL (ref 250–450)
UIBC: 223 ug/dL

## 2019-05-27 LAB — FERRITIN: Ferritin: 576 ng/mL — ABNORMAL HIGH (ref 11–307)

## 2019-05-27 LAB — LACTATE DEHYDROGENASE: LDH: 148 U/L (ref 98–192)

## 2019-05-30 ENCOUNTER — Telehealth: Payer: Self-pay | Admitting: *Deleted

## 2019-05-30 NOTE — Telephone Encounter (Signed)
Message was left re: her follow up visit.

## 2019-06-02 ENCOUNTER — Other Ambulatory Visit (HOSPITAL_COMMUNITY): Payer: PPO

## 2019-06-06 ENCOUNTER — Other Ambulatory Visit: Payer: Self-pay

## 2019-06-06 ENCOUNTER — Ambulatory Visit (INDEPENDENT_AMBULATORY_CARE_PROVIDER_SITE_OTHER): Payer: PPO | Admitting: Physician Assistant

## 2019-06-06 ENCOUNTER — Encounter: Payer: Self-pay | Admitting: Physician Assistant

## 2019-06-06 ENCOUNTER — Ambulatory Visit: Payer: PPO | Admitting: Family Medicine

## 2019-06-06 VITALS — BP 163/58 | HR 58 | Temp 97.4°F | Ht 65.0 in | Wt 117.6 lb

## 2019-06-06 DIAGNOSIS — L739 Follicular disorder, unspecified: Secondary | ICD-10-CM

## 2019-06-06 MED ORDER — CEPHALEXIN 250 MG PO CAPS: 250.0000 mg | ORAL_CAPSULE | Freq: Three times a day (TID) | ORAL | 0 refills | Status: DC

## 2019-06-06 NOTE — Progress Notes (Signed)
BP (!) 197/74   Pulse (!) 58   Temp (!) 97.4 F (36.3 C) (Temporal)   Ht 5\' 5"  (1.651 m)   Wt 117 lb 9.6 oz (53.3 kg)   BMI 19.57 kg/m    Subjective:    Patient ID: [name], female    DOB: 03-05-1934, 85 y.o.   MRN: [id number]  HPI: [name] is a 85 y.o. female presenting on 06/06/2019 for sore on neck  This patient comes in with a lesion on the back of her neck for the past 3 days.  She was rubbing the back of her neck while she was sleeping and felt a bump there and thought that it could have been something like a mole.  However it has grown in size and some pain.  She denies any drainage.  She has never had a lesion like this before.  She denies any fever or chills.  Past Medical History:  Diagnosis Date  . Anemia   . Arthritis   . Asthma   . Atrial fibrillation ([place])   . CKD (chronic kidney disease), stage III   . Heart murmur   . Hyperlipidemia   . Hypertension   . Hypothyroidism   . Nodule of right lung    Right upper lobe  . PONV (postoperative nausea and vomiting)   . Renal insufficiency    Chronic  . Renal vascular disease   . Right renal artery stenosis ([place]) 05/09/2015  . Vertigo    Relevant past medical, surgical, family and social history reviewed and updated as indicated. Interim medical history since our last visit reviewed. Allergies and medications reviewed and updated. DATA REVIEWED: CHART IN EPIC  Family History reviewed for pertinent findings.  Review of Systems  Constitutional: Negative.   HENT: Negative.   Eyes: Negative.   Respiratory: Negative.   Gastrointestinal: Negative.   Genitourinary: Negative.   Skin: Positive for color change and wound.    Allergies as of 06/06/2019      Reactions   Amlodipine Other (See Comments)   edema   Isosorbide Nitrate Nausea Only   Sulfonamide Derivatives Nausea Only, Rash   Terazosin Hives, Nausea Only      Medication List       Accurate as of June 06, 2019  8:31 AM. If you  have any questions, ask your nurse or doctor.        acetaminophen 325 MG tablet Commonly known as: TYLENOL Take 325 mg by mouth daily as needed for moderate pain or headache.   atenolol-chlorthalidone 50-25 MG tablet Commonly known as: TENORETIC TAKE 1 TABLET BY MOUTH EVERY DAY   atorvastatin 20 MG tablet Commonly known as: LIPITOR TAKE 1/2 TABLET BY MOUTH EVERY DAY   calcium-vitamin D 500-200 MG-UNIT tablet Commonly known as: OSCAL WITH D Take 2 tablets by mouth at bedtime.   cephALEXin 250 MG capsule Commonly known as: KEFLEX Take 1 capsule (250 mg total) by mouth 3 (three) times daily. Started by: Terald Sleeper, PA-C   cholecalciferol 1000 units tablet Commonly known as: VITAMIN D Take 1,000 Units by mouth daily.   ferrous sulfate 325 (65 FE) MG tablet Take 325 mg by mouth 2 (two) times daily with a meal.   flecainide 50 MG tablet Commonly known as: TAMBOCOR Take 1 tablet (50 mg total) by mouth 2 (two) times daily.   fluticasone 50 MCG/ACT nasal spray Commonly known as: FLONASE Place 1 spray into both nostrils 2 (two) times daily.  hydrALAZINE 100 MG tablet Commonly known as: APRESOLINE Take 100 mg by mouth 2 (two) times daily.   hydroxypropyl methylcellulose / hypromellose 2.5 % ophthalmic solution Commonly known as: ISOPTO TEARS / GONIOVISC Place 1 drop into both eyes daily.   letrozole 2.5 MG tablet Commonly known as: FEMARA Take 1 tablet (2.5 mg total) by mouth daily.   levothyroxine 75 MCG tablet Commonly known as: SYNTHROID TAKE 1 TABLET BY MOUTH EVERY DAY   lisinopril 40 MG tablet Commonly known as: ZESTRIL Take 1 tablet by mouth daily.   ondansetron 4 MG tablet Commonly known as: Zofran Take 1 tablet (4 mg total) by mouth every 8 (eight) hours as needed for nausea or vomiting.          Objective:    BP (!) 197/74   Pulse (!) 58   Temp (!) 97.4 F (36.3 C) (Temporal)   Ht 5\' 5"  (1.651 m)   Wt 117 lb 9.6 oz (53.3 kg)   BMI 19.57  kg/m   Allergies  Allergen Reactions  . Amlodipine Other (See Comments)    edema  . Isosorbide Nitrate Nausea Only  . Sulfonamide Derivatives Nausea Only and Rash  . Terazosin Hives and Nausea Only    Wt Readings from Last 3 Encounters:  06/06/19 117 lb 9.6 oz (53.3 kg)  03/29/19 119 lb (54 kg)  03/04/19 119 lb 12.8 oz (54.3 kg)    Physical Exam Constitutional:      General: She is not in acute distress.    Appearance: Normal appearance. She is well-developed.  HENT:     Head: Normocephalic and atraumatic.  Cardiovascular:     Rate and Rhythm: Normal rate.  Pulmonary:     Effort: Pulmonary effort is normal.  Skin:    General: Skin is warm and dry.     Findings: No rash.     Comments: 1 cm pustule on left posterior neck at hairline.  No drainage, minimal erythema surrounding it  Neurological:     Mental Status: She is alert and oriented to person, place, and time.     Deep Tendon Reflexes: Reflexes are normal and symmetric.         Assessment & Plan:   1. Folliculitis - cephALEXin (KEFLEX) 250 MG capsule; Take 1 capsule (250 mg total) by mouth 3 (three) times daily.  Dispense: 30 capsule; Refill: 0   Continue all other maintenance medications as listed above.  Follow up plan: No follow-ups on file.  Educational handout given for folliculitis  Terald Sleeper PA-C [place] [address]  [place], [place] [address] [phone]   06/06/2019, 8:31 AM

## 2019-06-06 NOTE — Patient Instructions (Signed)
Folliculitis  Folliculitis is inflammation of the hair follicles. Folliculitis most commonly occurs on the scalp, thighs, legs, back, and buttocks. However, it can occur anywhere on the body. What are the causes? This condition may be caused by:  A bacterial infection (common).  A fungal infection.  A viral infection.  Contact with certain chemicals, especially oils and tars.  Shaving or waxing.  Greasy ointments or creams applied to the skin. Long-lasting folliculitis and folliculitis that keeps coming back may be caused by bacteria. This bacteria can live anywhere on your skin and is often found in the nostrils. What increases the risk? You are more likely to develop this condition if you have:  A weakened immune system.  Diabetes.  Obesity. What are the signs or symptoms? Symptoms of this condition include:  Redness.  Soreness.  Swelling.  Itching.  Small white or yellow, pus-filled, itchy spots (pustules) that appear over a reddened area. If there is an infection that goes deep into the follicle, these may develop into a boil (furuncle).  A group of closely packed boils (carbuncle). These tend to form in hairy, sweaty areas of the body. How is this diagnosed? This condition is diagnosed with a skin exam. To find what is causing the condition, your health care provider may take a sample of one of the pustules or boils for testing in a lab. How is this treated? This condition may be treated by:  Applying warm compresses to the affected areas.  Taking an antibiotic medicine or applying an antibiotic medicine to the skin.  Applying or bathing with an antiseptic solution.  Taking an over-the-counter medicine to help with itching.  Having a procedure to drain any pustules or boils. This may be done if a pustule or boil contains a lot of pus or fluid.  Having laser hair removal. This may be done to treat long-lasting folliculitis. Follow these instructions at  home: Managing pain and swelling   If directed, apply heat to the affected area as often as told by your health care provider. Use the heat source that your health care provider recommends, such as a moist heat pack or a heating pad. ? Place a towel between your skin and the heat source. ? Leave the heat on for 20-30 minutes. ? Remove the heat if your skin turns bright red. This is especially important if you are unable to feel pain, heat, or cold. You may have a greater risk of getting burned. General instructions  If you were prescribed an antibiotic medicine, take it or apply it as told by your health care provider. Do not stop using the antibiotic even if your condition improves.  Check the irritated area every day for signs of infection. Check for: ? Redness, swelling, or pain. ? Fluid or blood. ? Warmth. ? Pus or a bad smell.  Do not shave irritated skin.  Take over-the-counter and prescription medicines only as told by your health care provider.  Keep all follow-up visits as told by your health care provider. This is important. Get help right away if:  You have more redness, swelling, or pain in the affected area.  Red streaks are spreading from the affected area.  You have a fever. Summary  Folliculitis is inflammation of the hair follicles. Folliculitis most commonly occurs on the scalp, thighs, legs, back, and buttocks.  This condition may be treated by taking an antibiotic medicine or applying an antibiotic medicine to the skin, and applying or bathing with an antiseptic   solution.  If you were prescribed an antibiotic medicine, take it or apply it as told by your health care provider. Do not stop using the antibiotic even if your condition improves.  Get help right away if you have new or worsening symptoms.  Keep all follow-up visits as told by your health care provider. This is important. This information is not intended to replace advice given to you by your  health care provider. Make sure you discuss any questions you have with your health care provider. Document Released: 10/13/2001 Document Revised: 03/13/2018 Document Reviewed: 03/13/2018 Elsevier Patient Education  2020 Elsevier Inc.  

## 2019-06-07 ENCOUNTER — Other Ambulatory Visit: Payer: Self-pay

## 2019-06-07 ENCOUNTER — Other Ambulatory Visit: Payer: Self-pay | Admitting: Family Medicine

## 2019-06-07 DIAGNOSIS — I10 Essential (primary) hypertension: Secondary | ICD-10-CM

## 2019-06-08 ENCOUNTER — Encounter (HOSPITAL_COMMUNITY): Payer: Self-pay | Admitting: Hematology

## 2019-06-08 ENCOUNTER — Inpatient Hospital Stay (HOSPITAL_BASED_OUTPATIENT_CLINIC_OR_DEPARTMENT_OTHER): Payer: PPO | Admitting: Hematology

## 2019-06-08 VITALS — BP 170/54 | HR 55 | Temp 97.7°F | Resp 18 | Wt 117.8 lb

## 2019-06-08 DIAGNOSIS — C50511 Malignant neoplasm of lower-outer quadrant of right female breast: Secondary | ICD-10-CM | POA: Diagnosis not present

## 2019-06-08 DIAGNOSIS — Z170 Estrogen receptor positive status [ER+]: Secondary | ICD-10-CM | POA: Diagnosis not present

## 2019-06-08 NOTE — Patient Instructions (Addendum)
[facility] Discharge Instructions  You were seen today by Dr. Katragadda. He went over your recent lab results. He will see you back in 4 months for labs and follow up.   Thank you for choosing  [facility] Center at [facility] to provide your oncology and hematology care.  To afford each patient quality time with our provider, please arrive at least 15 minutes before your scheduled appointment time.   If you have a lab appointment with the Cancer Center please come in thru the  Main Entrance and check in at the main information desk  You need to re-schedule your appointment should you arrive 10 or more minutes late.  We strive to give you quality time with our providers, and arriving late affects you and other patients whose appointments are after yours.  Also, if you no show three or more times for appointments you may be dismissed from the clinic at the providers discretion.     Again, thank you for choosing [facility].  Our hope is that these requests will decrease the amount of time that you wait before being seen by our physicians.       _____________________________________________________________  Should you have questions after your visit to [facility], please contact our office at [phone] between the hours of 8:00 a.m. and 4:30 p.m.  Voicemails left after 4:00 p.m. will not be returned until the following business day.  For prescription refill requests, have your pharmacy contact our office and allow 72 hours.    Cancer Center Support Programs:   > Cancer Support Group  2nd Tuesday of the month 1pm-2pm, Journey Room    

## 2019-06-08 NOTE — Progress Notes (Signed)
[name], [name] [address]   CLINIC:  Medical Oncology/Hematology  PCP:  [name], DO [place] [id number] [phone]   REASON FOR VISIT:  Follow-up for right breast cancer.  CURRENT THERAPY: Anastrozole.  BRIEF ONCOLOGIC HISTORY:  Oncology History Overview Note  Cancer Staging Malignant neoplasm of lower-outer quadrant of right breast of female, estrogen receptor positive ([place]) Staging form: Breast, AJCC 8th Edition - Clinical stage from 02/22/2018: Stage IA (cT1c, cN0, cM0, G1, ER+, PR+, HER2-) - Signed by Truitt Merle, MD on 03/02/2018    Malignant neoplasm of lower-outer quadrant of right breast of female, estrogen receptor positive ([place])  02/12/2018 Mammogram   She had routine screening bilateral mammography on 02/12/2018 at [facility] with results showing: indeterminate irregular mass in the right breast.    02/16/2018 Mammogram   She underwent right diagnostic mammography with tomography and right breast ultrasonography at [facility] on 02/16/2018 showing: 1.1 cm irregular mass at the 8 O'clock position on the right breast   02/22/2018 Pathology Results   Diagnosis 1. Breast, right, needle core biopsy, lateral, 8 o'clock - INVASIVE AND IN SITU DUCTAL CARCINOMA. - SEE COMMENT. 2. Breast, right, needle core biopsy, 11 o'clock - FIBROCYSTIC CHANGES. - USUAL DUCTAL HYPERPLASIA. - THERE IS NO EVIDENCE OF MALIGNANCY.   02/22/2018 Receptors her2   IMMUNOHISTOCHEMICAL AND MORPHOMETRIC ANALYSIS PERFORMED MANUALLY Estrogen Receptor: 100%, POSITIVE, STRONG STAINING INTENSITY Progesterone Receptor: 40%, POSITIVE, MODERATE STAINING INTENSITY Proliferation Marker Ki67: 1% Her2 Negative   02/22/2018 Cancer Staging   Staging form: Breast, AJCC 8th Edition - Clinical stage from 02/22/2018: Stage IA (cT1c, cN0, cM0, G1, ER+, PR+, HER2-) - Signed by Truitt Merle, MD on 03/02/2018    02/25/2018 Initial Diagnosis   Malignant neoplasm of  lower-outer quadrant of right breast of female, estrogen receptor positive ([place])      CANCER STAGING: Cancer Staging Malignant neoplasm of lower-outer quadrant of right breast of female, estrogen receptor positive ([place]) Staging form: Breast, AJCC 8th Edition - Clinical stage from 02/22/2018: Stage IA (cT1c, cN0, cM0, G1, ER+, PR+, HER2-) - Signed by Truitt Merle, MD on 03/02/2018    INTERVAL HISTORY:  [name] 85 y.o. female seen for follow-up of right breast cancer.  She is tolerating anastrozole very well.  She takes it at nighttime.  Denies any new onset pains.  Denies any major hot flashes.  Denies any arthralgias or myalgias.  Appetite is 100%.  Energy levels are 75%.  No new pains reported.  Reports taking calcium and vitamin D supplements.    REVIEW OF SYSTEMS:  Review of Systems  All other systems reviewed and are negative.    PAST MEDICAL/SURGICAL HISTORY:  Past Medical History:  Diagnosis Date  . Anemia   . Arthritis   . Asthma   . Atrial fibrillation ([place])   . CKD (chronic kidney disease), stage III   . Heart murmur   . Hyperlipidemia   . Hypertension   . Hypothyroidism   . Nodule of right lung    Right upper lobe  . PONV (postoperative nausea and vomiting)   . Renal insufficiency    Chronic  . Renal vascular disease   . Right renal artery stenosis ([place]) 05/09/2015  . Vertigo    Past Surgical History:  Procedure Laterality Date  . BREAST LUMPECTOMY WITH RADIOACTIVE SEED LOCALIZATION Right 04/08/2018   Procedure: BREAST LUMPECTOMY WITH RADIOACTIVE SEED LOCALIZATION;  Surgeon: Rolm Bookbinder, MD;  Location: [facility];  Service: General;  Laterality: Right;  . IR GENERIC HISTORICAL  08/29/2016   IR US GUIDE VASC ACCESS RIGHT 08/29/2016 [facility]  . IR GENERIC HISTORICAL  08/29/2016   IR RENAL BILAT S&I MOD SED 08/29/2016 [facility]  . KNEE ARTHROSCOPY     2019  . THYROIDECTOMY, PARTIAL Right 1981   middle lobe removed 1st, then right lobe removed 7-8 years  later (approx. 1988)     SOCIAL HISTORY:  Social History   Socioeconomic History  . Marital status: Married    Spouse name: Not on file  . Number of children: 2  . Years of education: 30  . Highest education level: Not on file  Occupational History  . Occupation: Retired  Scientific laboratory technician  . Financial resource strain: Not hard at all  . Food insecurity    Worry: Never true    Inability: Never true  . Transportation needs    Medical: No    Non-medical: No  Tobacco Use  . Smoking status: Never Smoker  . Smokeless tobacco: Never Used  . Tobacco comment: Pt denies cigarettes  Substance and Sexual Activity  . Alcohol use: No  . Drug use: No  . Sexual activity: Never  Lifestyle  . Physical activity    Days per week: 2 days    Minutes per session: 30 min  . Stress: Not at all  Relationships  . Social connections    Talks on phone: More than three times a week    Gets together: More than three times a week    Attends religious service: More than 4 times per year    Active member of club or organization: Yes    Attends meetings of clubs or organizations: More than 4 times per year    Relationship status: Married  . Intimate partner violence    Fear of current or ex partner: No    Emotionally abused: No    Physically abused: No    Forced sexual activity: No  Other Topics Concern  . Not on file  Social History Narrative   Lives at home with husband.   Ambidextrous (writes with left hand).   No caffeine use.    FAMILY HISTORY:  Family History  Problem Relation Age of Onset  . Thyroid disease Mother   . Stroke Father   . Hypertension Brother   . Lung cancer Maternal Uncle   . Cancer Maternal Grandmother        Liver  . Lung cancer Maternal Uncle   . Hypertension Daughter   . Hypercholesterolemia Daughter   . Hypercholesterolemia Daughter     CURRENT MEDICATIONS:  Outpatient Encounter Medications as of 06/08/2019  Medication Sig Note  . atenolol-chlorthalidone  (TENORETIC) 50-25 MG tablet TAKE 1 TABLET BY MOUTH EVERY DAY   . atorvastatin (LIPITOR) 20 MG tablet TAKE 1/2 TABLET BY MOUTH EVERY DAY   . calcium-vitamin D (OSCAL WITH D) 500-200 MG-UNIT per tablet Take 2 tablets by mouth at bedtime.    . cephALEXin (KEFLEX) 250 MG capsule Take 1 capsule (250 mg total) by mouth 3 (three) times daily.   . cholecalciferol (VITAMIN D) 1000 units tablet Take 1,000 Units by mouth daily.   . ciclopirox (PENLAC) 8 % solution APPLY TO ALL TOENAILS DAILY, ONCE WEEKLY CLEANSE NAILS THOROUGHLY WITH NAIL POLISH REMOVER   . ferrous sulfate 325 (65 FE) MG tablet Take 325 mg by mouth 2 (two) times daily with a meal.   . flecainide (TAMBOCOR) 50 MG tablet  Take 1 tablet (50 mg total) by mouth 2 (two) times daily.   . hydrALAZINE (APRESOLINE) 100 MG tablet Take 100 mg by mouth 2 (two) times daily.   . hydroxypropyl methylcellulose / hypromellose (ISOPTO TEARS / GONIOVISC) 2.5 % ophthalmic solution Place 1 drop into both eyes daily.    Marland Kitchen letrozole (FEMARA) 2.5 MG tablet Take 1 tablet (2.5 mg total) by mouth daily.   Marland Kitchen levothyroxine (SYNTHROID) 75 MCG tablet TAKE 1 TABLET BY MOUTH EVERY DAY   . lisinopril (PRINIVIL,ZESTRIL) 40 MG tablet Take 1 tablet by mouth daily. 02/08/2019: Blood  pressure was low.  Marland Kitchen acetaminophen (TYLENOL) 325 MG tablet Take 325 mg by mouth daily as needed for moderate pain or headache.   . fluticasone (FLONASE) 50 MCG/ACT nasal spray Place 1 spray into both nostrils 2 (two) times daily.    . ondansetron (ZOFRAN) 4 MG tablet Take 1 tablet (4 mg total) by mouth every 8 (eight) hours as needed for nausea or vomiting. (Patient not taking: Reported on 06/08/2019)    No facility-administered encounter medications on file as of 06/08/2019.     ALLERGIES:  Allergies  Allergen Reactions  . Amlodipine Other (See Comments)    edema  . Isosorbide Nitrate Nausea Only  . Sulfonamide Derivatives Nausea Only and Rash  . Terazosin Hives and Nausea Only      PHYSICAL EXAM:  ECOG Performance status: 1  Vitals:   06/08/19 1531  BP: (!) 170/54  Pulse: (!) 55  Resp: 18  Temp: 97.7 F (36.5 C)  SpO2: 95%   Filed Weights   06/08/19 1531  Weight: 117 lb 12.8 oz (53.4 kg)    Physical Exam Vitals signs reviewed.  Constitutional:      Appearance: Normal appearance.  Cardiovascular:     Rate and Rhythm: Normal rate and regular rhythm.     Heart sounds: Normal heart sounds.  Pulmonary:     Effort: Pulmonary effort is normal.     Breath sounds: Normal breath sounds.  Abdominal:     General: There is no distension.     Palpations: Abdomen is soft. There is no mass.  Skin:    General: Skin is warm.  Neurological:     General: No focal deficit present.     Mental Status: She is alert and oriented to person, place, and time.  Psychiatric:        Mood and Affect: Mood normal.        Behavior: Behavior normal.      LABORATORY DATA:  I have reviewed the labs as listed.  CBC    Component Value Date/Time   WBC 2.7 (L) 06/14/2019 1436   WBC 2.8 (L) 05/27/2019 0914   RBC 3.86 06/14/2019 1436   RBC 3.82 (L) 05/27/2019 0914   HGB 11.7 06/14/2019 1436   HCT 35.3 06/14/2019 1436   PLT CANCELED 06/14/2019 1436   MCV 92 06/14/2019 1436   MCH 30.3 06/14/2019 1436   MCH 30.9 05/27/2019 0914   MCHC 33.1 06/14/2019 1436   MCHC 33.7 05/27/2019 0914   RDW 12.6 06/14/2019 1436   LYMPHSABS 0.8 06/14/2019 1436   MONOABS 0.3 05/27/2019 0914   EOSABS 0.0 06/14/2019 1436   BASOSABS 0.0 06/14/2019 1436   CMP Latest Ref Rng & Units 06/14/2019 05/27/2019 04/19/2019  Glucose 65 - 99 mg/dL 104(H) 110(H) 127(H)  BUN 8 - 27 mg/dL 27 36(H) 21  Creatinine 0.57 - 1.00 mg/dL 1.42(H) 1.43(H) 1.50(H)  Sodium 134 - 144 mmol/L 140  138 139  Potassium 3.5 - 5.2 mmol/L 4.0 3.5 3.9  Chloride 96 - 106 mmol/L 98 99 97  CO2 20 - 29 mmol/L '29 29 29  ' Calcium 8.7 - 10.3 mg/dL 9.7 9.4 10.1  Total Protein 6.0 - 8.5 g/dL 7.2 7.8 7.8  Total Bilirubin 0.0 - 1.2 mg/dL  0.5 0.6 0.4  Alkaline Phos 39 - 117 IU/L 65 60 62  AST 0 - 40 IU/L '18 22 20  ' ALT 0 - 32 IU/L '11 14 11       ' DIAGNOSTIC IMAGING:  I have independently reviewed the scans and discussed with the patient.      ASSESSMENT & PLAN:   Malignant neoplasm of lower-outer quadrant of right breast of female, estrogen receptor positive ([place]) 1.  Stage I right breast cancer: -She had a mammogram on 02/16/2018 which showed B RADS category 0 incomplete.  She was referred for a biopsy. - Right breast biopsy on 02/22/2018 consistent with IDC. -Right breast lumpectomy on 04/08/2018, IDC, 1.1 cm, grade 1, margins negative, ER/PR positive, HER-2 negative, Ki-67 1% -Letrozole started on 05/28/2018.  She is tolerating this well. -She declined adjuvant RT. -Labs on 02/24/2019 showed hemoglobin 10.4, platelets 119, WBC 2.6 -Mammogram in July 2020 was negative for any disease - Physical exam was negative for any abnormalities.  - Labs are acceptable. She remains on letrozole at night, tolerating well.  - Plan to repeat mammogram in July 2021. - She will return to clinic in 4 mths.  2.  Pancytopenia: -Mild thrombocytopenia since 2015.  Leukopenia since 2014. She also has normocytic anemia. - Etiology could be from MDS as well as CKD.   3.  DEXA  - July 2020: T score -1.0. - Plan to repeat in 2 years. - Continue Calcium and Vd.   Total time spent is 25 minutes with more than 50% of the time spent face-to-face discussing treatment plan, counseling and coordination of care.  Orders placed this encounter:  Orders Placed This Encounter  Procedures  . CBC with Differential/Platelet  . Comprehensive metabolic panel      Derek Jack, MD [place] [phone]

## 2019-06-08 NOTE — Assessment & Plan Note (Signed)
1.  Stage I right breast cancer: -She had a mammogram on 02/16/2018 which showed B RADS category 0 incomplete.  She was referred for a biopsy. - Right breast biopsy on 02/22/2018 consistent with IDC. -Right breast lumpectomy on 04/08/2018, IDC, 1.1 cm, grade 1, margins negative, ER/PR positive, HER-2 negative, Ki-67 1% -Letrozole started on 05/28/2018.  She is tolerating this well. -She declined adjuvant RT. -Labs on 02/24/2019 showed hemoglobin 10.4, platelets 119, WBC 2.6 -Mammogram in July 2020 was negative for any disease - Physical exam was negative for any abnormalities.  - Labs are acceptable. She remains on letrozole at night, tolerating well.  - Plan to repeat mammogram in July 2021. - She will return to clinic in 4 mths.  2.  Pancytopenia: -Mild thrombocytopenia since 2015.  Leukopenia since 2014. She also has normocytic anemia. - Etiology could be from MDS as well as CKD.   3.  DEXA  - July 2020: T score -1.0. - Plan to repeat in 2 years. - Continue Calcium and Vd.

## 2019-06-09 ENCOUNTER — Ambulatory Visit (HOSPITAL_COMMUNITY): Payer: PPO | Admitting: Hematology

## 2019-06-10 ENCOUNTER — Other Ambulatory Visit: Payer: Self-pay

## 2019-06-13 ENCOUNTER — Ambulatory Visit (INDEPENDENT_AMBULATORY_CARE_PROVIDER_SITE_OTHER): Payer: PPO

## 2019-06-13 DIAGNOSIS — Z23 Encounter for immunization: Secondary | ICD-10-CM | POA: Diagnosis not present

## 2019-06-14 ENCOUNTER — Other Ambulatory Visit: Payer: Self-pay | Admitting: Family Medicine

## 2019-06-14 ENCOUNTER — Other Ambulatory Visit: Payer: PPO

## 2019-06-14 ENCOUNTER — Other Ambulatory Visit: Payer: Self-pay

## 2019-06-14 DIAGNOSIS — Z170 Estrogen receptor positive status [ER+]: Secondary | ICD-10-CM

## 2019-06-14 DIAGNOSIS — D696 Thrombocytopenia, unspecified: Secondary | ICD-10-CM

## 2019-06-14 DIAGNOSIS — N1832 Chronic kidney disease, stage 3b: Secondary | ICD-10-CM

## 2019-06-14 DIAGNOSIS — E034 Atrophy of thyroid (acquired): Secondary | ICD-10-CM | POA: Diagnosis not present

## 2019-06-14 DIAGNOSIS — C50511 Malignant neoplasm of lower-outer quadrant of right female breast: Secondary | ICD-10-CM

## 2019-06-14 DIAGNOSIS — D631 Anemia in chronic kidney disease: Secondary | ICD-10-CM | POA: Diagnosis not present

## 2019-06-15 LAB — CBC WITH DIFFERENTIAL/PLATELET
Basophils Absolute: 0 10*3/uL (ref 0.0–0.2)
Basos: 0 %
EOS (ABSOLUTE): 0 10*3/uL (ref 0.0–0.4)
Eos: 1 %
Hematocrit: 35.3 % (ref 34.0–46.6)
Hemoglobin: 11.7 g/dL (ref 11.1–15.9)
Immature Grans (Abs): 0 10*3/uL (ref 0.0–0.1)
Immature Granulocytes: 0 %
Lymphocytes Absolute: 0.8 10*3/uL (ref 0.7–3.1)
Lymphs: 31 %
MCH: 30.3 pg (ref 26.6–33.0)
MCHC: 33.1 g/dL (ref 31.5–35.7)
MCV: 92 fL (ref 79–97)
Monocytes Absolute: 0.3 10*3/uL (ref 0.1–0.9)
Monocytes: 11 %
Neutrophils Absolute: 1.5 10*3/uL (ref 1.4–7.0)
Neutrophils: 57 %
RBC: 3.86 x10E6/uL (ref 3.77–5.28)
RDW: 12.6 % (ref 11.7–15.4)
WBC: 2.7 10*3/uL — ABNORMAL LOW (ref 3.4–10.8)

## 2019-06-15 LAB — CMP14+EGFR
ALT: 11 IU/L (ref 0–32)
AST: 18 IU/L (ref 0–40)
Albumin/Globulin Ratio: 1.6 (ref 1.2–2.2)
Albumin: 4.4 g/dL (ref 3.6–4.6)
Alkaline Phosphatase: 65 IU/L (ref 39–117)
BUN/Creatinine Ratio: 19 (ref 12–28)
BUN: 27 mg/dL (ref 8–27)
Bilirubin Total: 0.5 mg/dL (ref 0.0–1.2)
CO2: 29 mmol/L (ref 20–29)
Calcium: 9.7 mg/dL (ref 8.7–10.3)
Chloride: 98 mmol/L (ref 96–106)
Creatinine, Ser: 1.42 mg/dL — ABNORMAL HIGH (ref 0.57–1.00)
GFR calc Af Amer: 39 mL/min/{1.73_m2} — ABNORMAL LOW (ref >59)
GFR calc non Af Amer: 34 mL/min/{1.73_m2} — ABNORMAL LOW (ref >59)
Globulin, Total: 2.8 g/dL (ref 1.5–4.5)
Glucose: 104 mg/dL — ABNORMAL HIGH (ref 65–99)
Potassium: 4 mmol/L (ref 3.5–5.2)
Sodium: 140 mmol/L (ref 134–144)
Total Protein: 7.2 g/dL (ref 6.0–8.5)

## 2019-06-15 LAB — THYROID PANEL WITH TSH
Free Thyroxine Index: 3.1 (ref 1.2–4.9)
T3 Uptake Ratio: 31 % (ref 24–39)
T4, Total: 10 ug/dL (ref 4.5–12.0)
TSH: 2.6 u[IU]/mL (ref 0.450–4.500)

## 2019-06-15 LAB — PLATELET COUNT ON CITRATED BLD

## 2019-06-17 ENCOUNTER — Other Ambulatory Visit: Payer: Self-pay

## 2019-06-18 ENCOUNTER — Encounter (HOSPITAL_COMMUNITY): Payer: Self-pay | Admitting: Hematology

## 2019-06-20 ENCOUNTER — Ambulatory Visit (INDEPENDENT_AMBULATORY_CARE_PROVIDER_SITE_OTHER): Payer: PPO | Admitting: Family Medicine

## 2019-06-20 ENCOUNTER — Other Ambulatory Visit: Payer: Self-pay

## 2019-06-20 DIAGNOSIS — N1832 Chronic kidney disease, stage 3b: Secondary | ICD-10-CM

## 2019-06-20 DIAGNOSIS — D696 Thrombocytopenia, unspecified: Secondary | ICD-10-CM | POA: Diagnosis not present

## 2019-06-20 DIAGNOSIS — E034 Atrophy of thyroid (acquired): Secondary | ICD-10-CM

## 2019-06-20 DIAGNOSIS — C50911 Malignant neoplasm of unspecified site of right female breast: Secondary | ICD-10-CM | POA: Diagnosis not present

## 2019-06-20 MED ORDER — LEVOTHYROXINE SODIUM 75 MCG PO TABS: 75.0000 ug | ORAL_TABLET | Freq: Every day | ORAL | 1 refills | Status: DC

## 2019-06-20 NOTE — Progress Notes (Signed)
Telephone visit  Subjective: CC: Establish care, hypothyroidism PCP: [name], DO [name] is a 85 y.o. female calls for telephone consult today. Patient provides verbal consent for consult held via phone.  Location of patient: home Location of provider: Working remotely from home Others present for call: none  1.  Hypothyroidism Patient reports compliance with Synthroid.  No heart palpitations, tremor, unplanned weight loss or change in bowel habits.  She has history of atrial fibrillation and reports compliance with flecainide.  She sees Dr. Percival Spanish again in February  2.  Breast cancer/thrombocytopenia Patient is on Femara for treatment of breast cancer.  She is followed by Dr. Delton Coombes with next office visit in February.  She voices no breast concerns today.  We reviewed her labs today.  Her platelet count looks improved from her previous check but is still slightly low at 114 K.  Does not report any bruising or bleeding episodes.  She overall feels well.  No reports of fever or fatigue.   ROS: Per HPI  Allergies  Allergen Reactions  . Amlodipine Other (See Comments)    edema  . Isosorbide Nitrate Nausea Only  . Sulfonamide Derivatives Nausea Only and Rash  . Terazosin Hives and Nausea Only   Past Medical History:  Diagnosis Date  . Anemia   . Arthritis   . Asthma   . Atrial fibrillation ([place])   . CKD (chronic kidney disease), stage III   . Heart murmur   . Hyperlipidemia   . Hypertension   . Hypothyroidism   . Nodule of right lung    Right upper lobe  . PONV (postoperative nausea and vomiting)   . Renal insufficiency    Chronic  . Renal vascular disease   . Right renal artery stenosis ([place]) 05/09/2015  . Vertigo     Current Outpatient Medications:  .  acetaminophen (TYLENOL) 325 MG tablet, Take 325 mg by mouth daily as needed for moderate pain or headache., Disp: , Rfl:  .  atenolol-chlorthalidone (TENORETIC) 50-25 MG tablet, TAKE 1 TABLET  BY MOUTH EVERY DAY, Disp: 90 tablet, Rfl: 0 .  atorvastatin (LIPITOR) 20 MG tablet, TAKE 1/2 TABLET BY MOUTH EVERY DAY, Disp: 45 tablet, Rfl: 1 .  calcium-vitamin D (OSCAL WITH D) 500-200 MG-UNIT per tablet, Take 2 tablets by mouth at bedtime. , Disp: , Rfl:  .  cephALEXin (KEFLEX) 250 MG capsule, Take 1 capsule (250 mg total) by mouth 3 (three) times daily., Disp: 30 capsule, Rfl: 0 .  cholecalciferol (VITAMIN D) 1000 units tablet, Take 1,000 Units by mouth daily., Disp: , Rfl:  .  ciclopirox (PENLAC) 8 % solution, APPLY TO ALL TOENAILS DAILY, ONCE WEEKLY CLEANSE NAILS THOROUGHLY WITH NAIL POLISH REMOVER, Disp: , Rfl:  .  ferrous sulfate 325 (65 FE) MG tablet, Take 325 mg by mouth 2 (two) times daily with a meal., Disp: , Rfl:  .  flecainide (TAMBOCOR) 50 MG tablet, Take 1 tablet (50 mg total) by mouth 2 (two) times daily., Disp: 180 tablet, Rfl: 3 .  fluticasone (FLONASE) 50 MCG/ACT nasal spray, Place 1 spray into both nostrils 2 (two) times daily. , Disp: , Rfl:  .  hydrALAZINE (APRESOLINE) 100 MG tablet, Take 100 mg by mouth 2 (two) times daily., Disp: , Rfl:  .  hydroxypropyl methylcellulose / hypromellose (ISOPTO TEARS / GONIOVISC) 2.5 % ophthalmic solution, Place 1 drop into both eyes daily. , Disp: , Rfl:  .  letrozole (FEMARA) 2.5 MG tablet, Take 1 tablet (  2.5 mg total) by mouth daily., Disp: 90 tablet, Rfl: 4 .  levothyroxine (SYNTHROID) 75 MCG tablet, TAKE 1 TABLET BY MOUTH EVERY DAY, Disp: 90 tablet, Rfl: 0 .  lisinopril (PRINIVIL,ZESTRIL) 40 MG tablet, Take 1 tablet by mouth daily., Disp: , Rfl:  .  ondansetron (ZOFRAN) 4 MG tablet, Take 1 tablet (4 mg total) by mouth every 8 (eight) hours as needed for nausea or vomiting. (Patient not taking: Reported on 06/08/2019), Disp: 20 tablet, Rfl: 0  Assessment/ Plan: 85 y.o. female   1. Hypothyroidism due to acquired atrophy of thyroid Asymptomatic.  Thyroid levels were appropriate.  Renewal of Synthroid sent.  We have scheduled an in  office visit on March 2 at 10:15 in the morning.  Patient is aware of date and time.  Plan for repeat thyroid levels at that time - levothyroxine (SYNTHROID) 75 MCG tablet; Take 1 tablet (75 mcg total) by mouth daily.  Dispense: 90 tablet; Refill: 1  2. Stage 3b chronic kidney disease Renal disease is stable.  She is on ACE inhibitor.  3. Thrombocytopenia ([facility]) Thought to be secondary to renal disease vs MDS.  She is closely followed by oncology with next office visit in February  4. Malignant neoplasm of right female breast, unspecified estrogen receptor status, unspecified site of breast ([place]) As above   Start time: 9:30am End time: 9:43am  Total time spent on patient care (including telephone call/ virtual visit): 17 minutes  [place], [place] [phone]

## 2019-07-21 ENCOUNTER — Ambulatory Visit (INDEPENDENT_AMBULATORY_CARE_PROVIDER_SITE_OTHER): Payer: PPO | Admitting: Nurse Practitioner

## 2019-07-21 ENCOUNTER — Other Ambulatory Visit: Payer: Self-pay

## 2019-07-21 ENCOUNTER — Encounter: Payer: Self-pay | Admitting: Nurse Practitioner

## 2019-07-21 ENCOUNTER — Ambulatory Visit (HOSPITAL_COMMUNITY)
Admission: RE | Admit: 2019-07-21 | Discharge: 2019-07-21 | Disposition: A | Discharge status: Home / Self Care | Payer: PPO | Source: Ambulatory Visit | Attending: Nurse Practitioner | Admitting: Nurse Practitioner

## 2019-07-21 VITALS — BP 151/54 | HR 57 | Temp 96.6°F | Resp 20 | Ht 65.0 in | Wt 118.0 lb

## 2019-07-21 DIAGNOSIS — M79662 Pain in left lower leg: Secondary | ICD-10-CM | POA: Diagnosis not present

## 2019-07-21 NOTE — Patient Instructions (Signed)

## 2019-07-21 NOTE — Progress Notes (Signed)
   Subjective:    Patient ID: [name], female    DOB: 1934/05/13, 85 y.o.   MRN: [id number]   Chief Complaint: Leg Pain (left)   HPI Patient come sin today c/o soreness in left calf that started last week. She says when she gets out of bed her legs hurt until she gets going well. Rates pain 10/10 until she gets going good. Just has soreness when she is sitting.   Review of Systems  Constitutional: Negative for activity change and appetite change.  HENT: Negative.   Eyes: Negative for pain.  Respiratory: Negative for shortness of breath.   Cardiovascular: Negative for chest pain, palpitations and leg swelling.  Gastrointestinal: Negative for abdominal pain.  Endocrine: Negative for polydipsia.  Genitourinary: Negative.   Skin: Negative for rash.  Neurological: Negative for dizziness, weakness and headaches.  Hematological: Does not bruise/bleed easily.  Psychiatric/Behavioral: Negative.   All other systems reviewed and are negative.      Objective:   Physical Exam Vitals signs and nursing note reviewed.  Constitutional:      Appearance: Normal appearance.  Cardiovascular:     Rate and Rhythm: Regular rhythm.     Heart sounds: Murmur (3/6 systolic) present.  Pulmonary:     Breath sounds: Normal breath sounds.  Skin:    General: Skin is warm and dry.  Neurological:     General: No focal deficit present.     Mental Status: She is oriented to person, place, and time.    Mild left calf pain on palpation. Positive homan sign   BP (!) 151/54   Pulse (!) 57   Temp (!) 96.6 F (35.9 C) (Temporal)   Resp 20   Ht 5\' 5"  (1.651 m)   Wt 118 lb (53.5 kg)   SpO2 95%   BMI 19.64 kg/m       Assessment & Plan:  [name] in today with chief complaint of Leg Pain (left)   1. Pain of left calf Will talk after doppler study done - US Venous Img Lower Unilateral Left; Future  Mary-Margaret Hassell Done, FNP

## 2019-07-22 ENCOUNTER — Other Ambulatory Visit: Payer: Self-pay | Admitting: Family Medicine

## 2019-07-22 ENCOUNTER — Telehealth: Payer: Self-pay | Admitting: Family Medicine

## 2019-07-22 DIAGNOSIS — M79662 Pain in left lower leg: Secondary | ICD-10-CM

## 2019-07-22 DIAGNOSIS — L03116 Cellulitis of left lower limb: Secondary | ICD-10-CM

## 2019-07-22 MED ORDER — DOXYCYCLINE HYCLATE 100 MG PO TABS: 100.0000 mg | ORAL_TABLET | Freq: Two times a day (BID) | ORAL | 0 refills | Status: DC

## 2019-07-22 NOTE — Telephone Encounter (Signed)
This is a duplicate call, see previous telephone encounter. Will close this one.

## 2019-07-22 NOTE — Telephone Encounter (Signed)
Pt aware.

## 2019-07-22 NOTE — Telephone Encounter (Signed)
Patient called requesting call back from nurse about her results from where she was sent to [facility] yesterday after seeing MMM.

## 2019-07-22 NOTE — Telephone Encounter (Signed)
Patient called back requesting an antibiotic for her leg because she believes it is infected. Pt wants nurse she was previously talking to, to call her back.

## 2019-07-22 NOTE — Telephone Encounter (Signed)
Pt was sent to [facility] yesterday to have a doppler study on her leg for DVT-imaging showed no evidence of DVT-pt advised but pt states she thinks she needs an antibiotic for her leg as it is swollen and pink. Pt saw MMM yesterday.

## 2019-07-22 NOTE — Telephone Encounter (Signed)
Pt aware I am waiting on response from the provider and I will call her back as soon as I know something.

## 2019-07-22 NOTE — Telephone Encounter (Signed)
I will send in an antibiotic.

## 2019-07-26 ENCOUNTER — Telehealth: Payer: Self-pay | Admitting: Nurse Practitioner

## 2019-07-26 DIAGNOSIS — I471 Supraventricular tachycardia: Secondary | ICD-10-CM | POA: Insufficient documentation

## 2019-07-26 DIAGNOSIS — I358 Other nonrheumatic aortic valve disorders: Secondary | ICD-10-CM | POA: Insufficient documentation

## 2019-07-26 NOTE — Telephone Encounter (Signed)
Ms. Biernat states that MMM had called in an antibiotic which she has almost completed. She states that her leg is not healed. She would like to know if she can get another antibiotic or if she should come in to have her leg looked at. Thank you!

## 2019-07-26 NOTE — Progress Notes (Signed)
Cardiology Office Note   Date:  07/27/2019   ID:  [name], DOB 03/08/1934, MRN [id number]  PCP:  [name], DO  Cardiologist:   Minus Breeding, MD   Chief Complaint  Patient presents with  . Congestive Heart Failure      History of Present Illness: [name] is a 85 y.o. female who presents with difficult to control with chronic diastolic CHF, and arrhythmias.  The patient denies any cardiovascular symptoms.  She has not had any tachypalpitations.  She thinks she does well from the standpoint.  She is being followed for breast cancer and her pancytopenia which has been stable.  The etiology is not clear.  She is taking care of her husband who has medical problems.   Past Medical History:  Diagnosis Date  . Anemia   . Arthritis   . Asthma   . Atrial fibrillation ([place])   . CKD (chronic kidney disease), stage III   . Heart murmur   . Hyperlipidemia   . Hypertension   . Hypothyroidism   . Nodule of right lung    Right upper lobe  . PONV (postoperative nausea and vomiting)   . Renal insufficiency    Chronic  . Renal vascular disease   . Right renal artery stenosis ([place]) 05/09/2015  . Vertigo     Past Surgical History:  Procedure Laterality Date  . BREAST LUMPECTOMY WITH RADIOACTIVE SEED LOCALIZATION Right 04/08/2018   Procedure: BREAST LUMPECTOMY WITH RADIOACTIVE SEED LOCALIZATION;  Surgeon: Rolm Bookbinder, MD;  Location: [facility];  Service: General;  Laterality: Right;  . IR GENERIC HISTORICAL  08/29/2016   IR US GUIDE VASC ACCESS RIGHT 08/29/2016 [facility]  . IR GENERIC HISTORICAL  08/29/2016   IR RENAL BILAT S&I MOD SED 08/29/2016 [facility]  . KNEE ARTHROSCOPY     2019  . THYROIDECTOMY, PARTIAL Right 1981   middle lobe removed 1st, then right lobe removed 7-8 years later (approx. 1988)     Current Outpatient Medications  Medication Sig Dispense Refill  . acetaminophen (TYLENOL) 325 MG tablet Take 325 mg by mouth daily as needed for  moderate pain or headache.    Marland Kitchen atenolol-chlorthalidone (TENORETIC) 50-25 MG tablet TAKE 1 TABLET BY MOUTH EVERY DAY 90 tablet 0  . atorvastatin (LIPITOR) 20 MG tablet TAKE 1/2 TABLET BY MOUTH EVERY DAY 45 tablet 1  . calcium-vitamin D (OSCAL WITH D) 500-200 MG-UNIT per tablet Take 2 tablets by mouth at bedtime.     . cholecalciferol (VITAMIN D) 1000 units tablet Take 1,000 Units by mouth daily.    . ciclopirox (PENLAC) 8 % solution APPLY TO ALL TOENAILS DAILY, ONCE WEEKLY CLEANSE NAILS THOROUGHLY WITH NAIL POLISH REMOVER    . doxycycline (VIBRA-TABS) 100 MG tablet Take 1 tablet (100 mg total) by mouth 2 (two) times daily for 7 days. 1 po bid 14 tablet 0  . ferrous sulfate 325 (65 FE) MG tablet Take 325 mg by mouth 2 (two) times daily with a meal.    . flecainide (TAMBOCOR) 50 MG tablet Take 1 tablet (50 mg total) by mouth 2 (two) times daily. 180 tablet 3  . fluticasone (FLONASE) 50 MCG/ACT nasal spray Place 1 spray into both nostrils 2 (two) times daily.     . hydrALAZINE (APRESOLINE) 100 MG tablet Take 100 mg by mouth 2 (two) times daily.    . hydroxypropyl methylcellulose / hypromellose (ISOPTO TEARS / GONIOVISC) 2.5 % ophthalmic solution Place 1 drop into  both eyes daily.     Marland Kitchen letrozole (FEMARA) 2.5 MG tablet Take 1 tablet (2.5 mg total) by mouth daily. 90 tablet 4  . levothyroxine (SYNTHROID) 75 MCG tablet Take 1 tablet (75 mcg total) by mouth daily. 90 tablet 1  . lisinopril (PRINIVIL,ZESTRIL) 40 MG tablet Take 1 tablet by mouth daily.    . ondansetron (ZOFRAN) 4 MG tablet Take 1 tablet (4 mg total) by mouth every 8 (eight) hours as needed for nausea or vomiting. 20 tablet 0   No current facility-administered medications for this visit.     Allergies:   Amlodipine, Isosorbide nitrate, Sulfonamide derivatives, and Terazosin    ROS:  Please see the history of present illness.   Otherwise, review of systems are positive for none.   All other systems are reviewed and negative.     PHYSICAL EXAM: VS:  BP (!) 171/64   Pulse (!) 52   Ht 5\' 5"  (1.651 m)   Wt 117 lb (53.1 kg)   BMI 19.47 kg/m  , BMI Body mass index is 19.47 kg/m. GENERAL:  Well appearing NECK:  No jugular venous distention, waveform within normal limits, carotid upstroke brisk and symmetric, no bruits, no thyromegaly LUNGS:  Clear to auscultation bilaterally CHEST:  Unremarkable HEART:  PMI not displaced or sustained,S1 and S2 within normal limits, no S3, no S4, no clicks, no rubs, 2 out of 6 apical systolic murmur radiating slightly at the, no diastolic murmurs ABD:  Flat, positive bowel sounds normal in frequency in pitch, no bruits, no rebound, no guarding, no midline pulsatile mass, no hepatomegaly, no splenomegaly EXT:  2 plus pulses throughout, no edema, no cyanosis no clubbing    EKG:  EKG is ordered today. The ekg ordered today demonstrates sinus rhythm, rate 52, axis within normal limits, QTC prolonged but stable   Recent Labs: 06/14/2019: ALT 11; BUN 27; Creatinine, Ser 1.42; Hemoglobin 11.7; Platelets CANCELED; Potassium 4.0; Sodium 140; TSH 2.600    Lipid Panel    Component Value Date/Time   CHOL 145 02/08/2019 1345   CHOL 159 01/13/2013 1042   TRIG 91 02/08/2019 1345   TRIG 84 03/10/2016 0928   TRIG 142 01/13/2013 1042   HDL 55 02/08/2019 1345   HDL 56 03/10/2016 0928   HDL 54 01/13/2013 1042   CHOLHDL 2.6 02/08/2019 1345   CHOLHDL 4.6 06/05/2007 0125   VLDL 54 (H) 06/05/2007 0125   LDLCALC 72 02/08/2019 1345   LDLCALC 76 03/14/2014 0822   LDLCALC 77 01/13/2013 1042      Wt Readings from Last 3 Encounters:  07/27/19 117 lb (53.1 kg)  07/21/19 118 lb (53.5 kg)  06/08/19 117 lb 12.8 oz (53.4 kg)      Other studies Reviewed: Additional studies/ records that were reviewed today include:  Labs. Review of the above records demonstrates:  Please see elsewhere in the note.      ASSESSMENT AND PLAN:   HFpEF -  the patient's had no new symptoms.  No change in  therapy.   Left atrial tachycardia-     She tolerates the flecainide.   HTN- Her blood pressure is  elevated but she thinks this is only because she is in the doctor's office and I see that it is unusual.  She will keep an eye on her blood pressure.  She says is in the 130s at home.   Carotid Stenosis-   I will follow-up this probably next year.  Thrombocytopenia/leukopenia -  Thiis has been  stable and is followed by oncology.   Ao Valve Sclerosis and MR  -    This has been mild and can be followed clinically.  COVID-19 Education -    We talked about the vaccine.   Current medicines are reviewed at length with the patient today.  The patient does not have concerns regarding medicines.  The following changes have been made:  no change  Labs/ tests ordered today include: None  Orders Placed This Encounter  Procedures  . EKG 12-Lead     Disposition:   FU with me in one year     Signed, Minus Breeding, MD  07/27/2019 4:38 PM    [place]

## 2019-07-26 NOTE — Telephone Encounter (Signed)
ntbs

## 2019-07-27 ENCOUNTER — Encounter: Payer: Self-pay | Admitting: Cardiology

## 2019-07-27 ENCOUNTER — Other Ambulatory Visit: Payer: Self-pay

## 2019-07-27 ENCOUNTER — Ambulatory Visit: Payer: PPO | Admitting: Cardiology

## 2019-07-27 VITALS — BP 171/64 | HR 52 | Ht 65.0 in | Wt 117.0 lb

## 2019-07-27 DIAGNOSIS — I471 Supraventricular tachycardia: Secondary | ICD-10-CM

## 2019-07-27 DIAGNOSIS — I358 Other nonrheumatic aortic valve disorders: Secondary | ICD-10-CM | POA: Diagnosis not present

## 2019-07-27 DIAGNOSIS — I10 Essential (primary) hypertension: Secondary | ICD-10-CM

## 2019-07-27 DIAGNOSIS — I5032 Chronic diastolic (congestive) heart failure: Secondary | ICD-10-CM | POA: Diagnosis not present

## 2019-07-27 DIAGNOSIS — Z7189 Other specified counseling: Secondary | ICD-10-CM

## 2019-07-27 DIAGNOSIS — D696 Thrombocytopenia, unspecified: Secondary | ICD-10-CM

## 2019-07-27 NOTE — Patient Instructions (Signed)
Medication Instructions:  The current medical regimen is effective;  continue present plan and medications.  *If you need a refill on your cardiac medications before your next appointment, please call your pharmacy*  Follow-Up: At [facility], you and your health needs are our priority.  As part of our continuing mission to provide you with exceptional heart care, we have created designated Provider Care Teams.  These Care Teams include your primary Cardiologist (physician) and Advanced Practice Providers (APPs -  Physician Assistants and Nurse Practitioners) who all work together to provide you with the care you need, when you need it.  Your next appointment:   12 month(s)  The format for your next appointment:   In Person  Provider:   James Hochrein, MD  Thank you for choosing [facility]!!     

## 2019-07-28 ENCOUNTER — Other Ambulatory Visit: Payer: Self-pay

## 2019-07-29 ENCOUNTER — Encounter: Payer: Self-pay | Admitting: Family

## 2019-07-29 ENCOUNTER — Ambulatory Visit (INDEPENDENT_AMBULATORY_CARE_PROVIDER_SITE_OTHER): Payer: PPO | Admitting: Family

## 2019-07-29 VITALS — BP 153/53 | HR 50 | Temp 98.4°F | Ht 65.0 in | Wt 116.0 lb

## 2019-07-29 DIAGNOSIS — M779 Enthesopathy, unspecified: Secondary | ICD-10-CM | POA: Diagnosis not present

## 2019-07-29 DIAGNOSIS — M79605 Pain in left leg: Secondary | ICD-10-CM

## 2019-07-29 MED ORDER — DICLOFENAC SODIUM 75 MG PO TBEC: 75.0000 mg | DELAYED_RELEASE_TABLET | Freq: Two times a day (BID) | ORAL | 0 refills | Status: DC

## 2019-07-29 NOTE — Progress Notes (Signed)
   Subjective:    Patient ID: [name], female    DOB: Aug 13, 1934, 85 y.o.   MRN: [id number]  Chief Complaint  Patient presents with  . left, lower leg pain    no DVT and finished antibiotic    HPI  Pt presents to the office today with recurrent left calf pain. She was seen on 07/21/19 and ordered a doppler which was negative. She was then started on doxycyline on 07/22/19 for 7 days. She reports the swelling, pain, and erythemas improved. She states she continues to having intermittent aching pian of 6 out 10. Denies any warmth now.   Review of Systems  All other systems reviewed and are negative.      Objective:   Physical Exam Vitals reviewed.  Constitutional:      General: She is not in acute distress.    Appearance: She is well-developed.  Eyes:     Pupils: Pupils are equal, round, and reactive to light.  Neck:     Thyroid: No thyromegaly.  Cardiovascular:     Rate and Rhythm: Normal rate and regular rhythm.     Heart sounds: Murmur present.  Pulmonary:     Effort: Pulmonary effort is normal. No respiratory distress.     Breath sounds: Normal breath sounds. No wheezing.  Abdominal:     General: Bowel sounds are normal. There is no distension.     Palpations: Abdomen is soft.     Tenderness: There is no abdominal tenderness.  Musculoskeletal:        General: No tenderness. Normal range of motion.     Cervical back: Normal range of motion and neck supple.     Left lower leg: Edema (trace) present.     Comments: Full ROM, no warmth or erythemas present. Mild tenderness in right achillis with palpation.   Skin:    General: Skin is warm and dry.  Neurological:     Mental Status: She is alert and oriented to person, place, and time.     Cranial Nerves: No cranial nerve deficit.     Deep Tendon Reflexes: Reflexes are normal and symmetric.  Psychiatric:        Behavior: Behavior normal.        Thought Content: Thought content normal.        Judgment: Judgment  normal.     BP (!) 153/53   Pulse (!) 50   Temp 98.4 F (36.9 C) (Temporal)   Ht 5\' 5"  (1.651 m)   Wt 116 lb (52.6 kg)   SpO2 97%   BMI 19.30 kg/m        Assessment & Plan:  [name] comes in today with chief complaint of left, lower leg pain (no DVT and finished antibiotic)   Diagnosis and orders addressed:  1. Left leg pain - diclofenac (VOLTAREN) 75 MG EC tablet; Take 1 tablet (75 mg total) by mouth 2 (two) times daily.  Dispense: 10 tablet; Refill: 0  2. Tendinitis - diclofenac (VOLTAREN) 75 MG EC tablet; Take 1 tablet (75 mg total) by mouth 2 (two) times daily.  Dispense: 10 tablet; Refill: 0  No infection present Given tenderness we will give 5 days of NSAID's  ROM exercise encouraged RTO if symptoms worsen or do not improve   [name], FNP

## 2019-07-29 NOTE — Patient Instructions (Signed)
Tendinitis ° °Tendinitis is swelling (inflammation) of a tendon. A tendon is a cord of tissue that connects muscle to bone. Tendinitis can cause pain, tenderness, and swelling. °What are the causes? °· Using a tendon or muscle too much (overuse). This is a common cause. °· Wear and tear that happens as you age. °· Injury. °· Some medical conditions, such as arthritis. °· Some medicines. °What increases the risk? °You are more likely to get this condition if you do activities that involve the same movements over and over again (repetitive motions). °What are the signs or symptoms? °· Pain. °· Tenderness. °· Mild swelling. °· Decreased range of motion. °How is this treated? °This condition is usually treated with RICE therapy. RICE stands for: °· Rest. °· Ice. °· Compression. This means putting pressure on the affected area. °· Elevation. This means raising the affected area above the level of your heart. °Treatment may also include: °· Medicines for swelling or pain. °· Exercises or physical therapy. °· A brace or splint. °· Surgery. This is rarely needed. °Follow these instructions at home: °If you have a splint or brace: °· Wear the splint or brace as told by your doctor. Remove it only as told by your doctor. °· Loosen the splint or brace if your fingers or toes: °? Tingle. °? Become numb. °? Turn cold and blue. °· Keep the splint or brace clean. °· If the splint or brace is not waterproof: °? Do not let it get wet. °? Cover it with a watertight covering when you take a bath or shower. °Managing pain, stiffness, and swelling ° °  ° °· If told, put ice on the affected area. °? If you have a removable splint or brace, remove it as told by your doctor. °? Put ice in a plastic bag. °? Place a towel between your skin and the bag. °? Leave the ice on for 20 minutes, 2-3 times a day. °· Move the fingers or toes of the affected arm or leg often, if this applies. This helps to prevent stiffness and to lessen  swelling. °· If told, raise the affected area above the level of your heart while you are sitting or lying down. °· If told, put heat on the affected area before you exercise. Use the heat source that your doctor recommends, such as a moist heat pack or a heating pad. °? Place a towel between your skin and the heat source. °? Leave the heat on for 20-30 minutes. °? Remove the heat if your skin turns bright red. This is very important if you are unable to feel pain, heat, or cold. You may have a greater risk of getting burned. °Driving °· Do not drive or use heavy machinery while taking prescription pain medicine. °· Ask your doctor when it is safe to drive if you have a splint or brace on any part of your arm or leg. °Activity °· Rest the affected area as told by your doctor. °· Return to your normal activities as told by your doctor. Ask your doctor what activities are safe for you. °· Avoid using the affected area while you have symptoms. °· Do exercises as told by your doctor. °General instructions °· If you have a splint, do not put pressure on any part of the splint until it is fully hardened. This may take several hours. °· Wear an elastic bandage or pressure (compression) wrap only as told by your doctor. °· Take over-the-counter and prescription medicines only as   told by your doctor.  Keep all follow-up visits as told by your doctor. This is important. Contact a doctor if:  You do not get better.  You get new problems, such as numbness in your hands, and you do not know why. Summary  Tendinitis is swelling (inflammation) of a tendon.  You are more likely to get this condition if you do activities that involve the same movements over and over again.  This condition is usually treated with RICE therapy. RICE stands for rest, ice, compression, and elevate.  Avoid using the affected area while you have symptoms. This information is not intended to replace advice given to you by your health care  provider. Make sure you discuss any questions you have with your health care provider. Document Released: 11/14/2010 Document Revised: 02/09/2018 Document Reviewed: 12/23/2017 Elsevier Patient Education  2020 [employer].

## 2019-08-01 NOTE — Telephone Encounter (Signed)
Patient had a follow up appointment scheduled and was seen on 07-29-19.

## 2019-08-09 ENCOUNTER — Other Ambulatory Visit (HOSPITAL_COMMUNITY): Payer: Self-pay | Admitting: *Deleted

## 2019-08-09 MED ORDER — LETROZOLE 2.5 MG PO TABS: 2.5000 mg | ORAL_TABLET | Freq: Every day | ORAL | 4 refills | Status: DC

## 2019-09-05 ENCOUNTER — Other Ambulatory Visit: Payer: Self-pay

## 2019-09-05 ENCOUNTER — Encounter: Payer: Self-pay | Admitting: Physician Assistant

## 2019-09-05 ENCOUNTER — Ambulatory Visit (INDEPENDENT_AMBULATORY_CARE_PROVIDER_SITE_OTHER): Payer: PPO | Admitting: Physician Assistant

## 2019-09-05 VITALS — BP 170/60 | HR 60 | Temp 99.3°F | Resp 20 | Ht 65.0 in | Wt 116.0 lb

## 2019-09-05 DIAGNOSIS — D492 Neoplasm of unspecified behavior of bone, soft tissue, and skin: Secondary | ICD-10-CM

## 2019-09-05 MED ORDER — CEPHALEXIN 250 MG PO CAPS: 250.0000 mg | ORAL_CAPSULE | Freq: Two times a day (BID) | ORAL | 0 refills | Status: DC

## 2019-09-07 ENCOUNTER — Other Ambulatory Visit: Payer: Self-pay | Admitting: Physician Assistant

## 2019-09-07 DIAGNOSIS — C4492 Squamous cell carcinoma of skin, unspecified: Secondary | ICD-10-CM

## 2019-09-07 DIAGNOSIS — C44729 Squamous cell carcinoma of skin of left lower limb, including hip: Secondary | ICD-10-CM | POA: Diagnosis not present

## 2019-09-07 HISTORY — DX: Squamous cell carcinoma of skin, unspecified: C44.92

## 2019-09-08 NOTE — Progress Notes (Signed)
Acute Office Visit  Subjective:    Patient ID: [name], female    DOB: 17-Apr-1934, 86 y.o.   MRN: [id number]  Chief Complaint  Patient presents with  . Hard place on leg    Rash This is a chronic problem. The current episode started 1 to 4 weeks ago. The problem has been gradually worsening since onset. The affected locations include the right lower leg. The rash is characterized by itchiness, dryness, scaling and swelling. She was exposed to nothing. Past treatments include nothing.   At this time the lesion is a raised papule that will peel up and then come back. She denies seeing any significant pus from the area.   Past Medical History:  Diagnosis Date  . Anemia   . Arthritis   . Asthma   . Atrial fibrillation ([place])   . CKD (chronic kidney disease), stage III   . Heart murmur   . Hyperlipidemia   . Hypertension   . Hypothyroidism   . Nodule of right lung    Right upper lobe  . PONV (postoperative nausea and vomiting)   . Renal insufficiency    Chronic  . Renal vascular disease   . Right renal artery stenosis ([place]) 05/09/2015  . Vertigo     Past Surgical History:  Procedure Laterality Date  . BREAST LUMPECTOMY WITH RADIOACTIVE SEED LOCALIZATION Right 04/08/2018   Procedure: BREAST LUMPECTOMY WITH RADIOACTIVE SEED LOCALIZATION;  Surgeon: Rolm Bookbinder, MD;  Location: [facility];  Service: General;  Laterality: Right;  . IR GENERIC HISTORICAL  08/29/2016   IR US GUIDE VASC ACCESS RIGHT 08/29/2016 [facility]  . IR GENERIC HISTORICAL  08/29/2016   IR RENAL BILAT S&I MOD SED 08/29/2016 [facility]  . KNEE ARTHROSCOPY     2019  . THYROIDECTOMY, PARTIAL Right 1981   middle lobe removed 1st, then right lobe removed 7-8 years later (approx. 1988)    Family History  Problem Relation Age of Onset  . Thyroid disease Mother   . Stroke Father   . Hypertension Brother   . Lung cancer Maternal Uncle   . Cancer Maternal Grandmother        Liver  . Lung cancer  Maternal Uncle   . Hypertension Daughter   . Hypercholesterolemia Daughter   . Hypercholesterolemia Daughter     Social History   Socioeconomic History  . Marital status: Married    Spouse name: Not on file  . Number of children: 2  . Years of education: 24  . Highest education level: Not on file  Occupational History  . Occupation: Retired  Tobacco Use  . Smoking status: Never Smoker  . Smokeless tobacco: Never Used  . Tobacco comment: Pt denies cigarettes  Substance and Sexual Activity  . Alcohol use: No  . Drug use: No  . Sexual activity: Never  Other Topics Concern  . Not on file  Social History Narrative   Lives at home with husband.   Ambidextrous (writes with left hand).   No caffeine use.   Social Determinants of Health   Financial Resource Strain:   . Difficulty of Paying Living Expenses: Not on file  Food Insecurity:   . Worried About Charity fundraiser in the Last Year: Not on file  . Ran Out of Food in the Last Year: Not on file  Transportation Needs:   . Lack of Transportation (Medical): Not on file  . Lack of Transportation (Non-Medical): Not on file  Physical Activity:   .  Days of Exercise per Week: Not on file  . Minutes of Exercise per Session: Not on file  Stress:   . Feeling of Stress : Not on file  Social Connections:   . Frequency of Communication with Friends and Family: Not on file  . Frequency of Social Gatherings with Friends and Family: Not on file  . Attends Religious Services: Not on file  . Active Member of Clubs or Organizations: Not on file  . Attends Archivist Meetings: Not on file  . Marital Status: Not on file  Intimate Partner Violence:   . Fear of Current or Ex-Partner: Not on file  . Emotionally Abused: Not on file  . Physically Abused: Not on file  . Sexually Abused: Not on file    Outpatient Medications Prior to Visit  Medication Sig Dispense Refill  . acetaminophen (TYLENOL) 325 MG tablet Take 325 mg by  mouth daily as needed for moderate pain or headache.    Marland Kitchen atenolol-chlorthalidone (TENORETIC) 50-25 MG tablet TAKE 1 TABLET BY MOUTH EVERY DAY 90 tablet 0  . atorvastatin (LIPITOR) 20 MG tablet TAKE 1/2 TABLET BY MOUTH EVERY DAY 45 tablet 1  . calcium-vitamin D (OSCAL WITH D) 500-200 MG-UNIT per tablet Take 2 tablets by mouth at bedtime.     . cholecalciferol (VITAMIN D) 1000 units tablet Take 1,000 Units by mouth daily.    . ciclopirox (PENLAC) 8 % solution APPLY TO ALL TOENAILS DAILY, ONCE WEEKLY CLEANSE NAILS THOROUGHLY WITH NAIL POLISH REMOVER    . ferrous sulfate 325 (65 FE) MG tablet Take 325 mg by mouth 2 (two) times daily with a meal.    . flecainide (TAMBOCOR) 50 MG tablet Take 1 tablet (50 mg total) by mouth 2 (two) times daily. 180 tablet 3  . fluticasone (FLONASE) 50 MCG/ACT nasal spray Place 1 spray into both nostrils 2 (two) times daily.     . hydrALAZINE (APRESOLINE) 100 MG tablet Take 100 mg by mouth 2 (two) times daily.    . hydroxypropyl methylcellulose / hypromellose (ISOPTO TEARS / GONIOVISC) 2.5 % ophthalmic solution Place 1 drop into both eyes daily.     Marland Kitchen letrozole (FEMARA) 2.5 MG tablet Take 1 tablet (2.5 mg total) by mouth daily. 90 tablet 4  . levothyroxine (SYNTHROID) 75 MCG tablet Take 1 tablet (75 mcg total) by mouth daily. 90 tablet 1  . lisinopril (PRINIVIL,ZESTRIL) 40 MG tablet Take 1 tablet by mouth daily.    . ondansetron (ZOFRAN) 4 MG tablet Take 1 tablet (4 mg total) by mouth every 8 (eight) hours as needed for nausea or vomiting. 20 tablet 0  . diclofenac (VOLTAREN) 75 MG EC tablet Take 1 tablet (75 mg total) by mouth 2 (two) times daily. 10 tablet 0   No facility-administered medications prior to visit.    Allergies  Allergen Reactions  . Amlodipine Other (See Comments)    edema  . Isosorbide Nitrate Nausea Only  . Sulfonamide Derivatives Nausea Only and Rash  . Terazosin Hives and Nausea Only    Review of Systems  Constitutional: Negative.   HENT:  Negative.   Eyes: Negative.   Respiratory: Negative.   Gastrointestinal: Negative.   Genitourinary: Negative.   Skin: Positive for rash.       Objective:     BP (!) 170/60   Pulse 60   Temp 99.3 F (37.4 C) (Temporal)   Resp 20   Ht 5\' 5"  (1.651 m)   Wt 116 lb (52.6 kg)  SpO2 95%   BMI 19.30 kg/m  Wt Readings from Last 3 Encounters:  09/05/19 116 lb (52.6 kg)  07/29/19 116 lb (52.6 kg)  07/27/19 117 lb (53.1 kg)   Examination: Patient has had a phase papular lesion that is nonhealing and scaly. There is some mild erythema around it. Scaling    Lab Results  Component Value Date   TSH 2.600 06/14/2019   Lab Results  Component Value Date   WBC 2.7 (L) 06/14/2019   HGB 11.7 06/14/2019   HCT 35.3 06/14/2019   MCV 92 06/14/2019   PLT CANCELED 06/14/2019   Lab Results  Component Value Date   NA 140 06/14/2019   K 4.0 06/14/2019   CO2 29 06/14/2019   GLUCOSE 104 (H) 06/14/2019   BUN 27 06/14/2019   CREATININE 1.42 (H) 06/14/2019   BILITOT 0.5 06/14/2019   ALKPHOS 65 06/14/2019   AST 18 06/14/2019   ALT 11 06/14/2019   PROT 7.2 06/14/2019   ALBUMIN 4.4 06/14/2019   CALCIUM 9.7 06/14/2019   ANIONGAP 10 05/27/2019   GFR 34.35 (L) 04/10/2014   Lab Results  Component Value Date   CHOL 145 02/08/2019   Lab Results  Component Value Date   HDL 55 02/08/2019   Lab Results  Component Value Date   LDLCALC 72 02/08/2019   Lab Results  Component Value Date   TRIG 91 02/08/2019   Lab Results  Component Value Date   CHOLHDL 2.6 02/08/2019   Lab Results  Component Value Date   HGBA1C 5.0 02/22/2015       Assessment & Plan:   Problem List Items Addressed This Visit    None    Visit Diagnoses    Atypical squamoproliferative skin lesion    -  Primary   Relevant Medications   cephALEXin (KEFLEX) 250 MG capsule   Other Relevant Orders   Ambulatory referral to Dermatology       Meds ordered this encounter  Medications  . cephALEXin (KEFLEX)  250 MG capsule    Sig: Take 1 capsule (250 mg total) by mouth 2 (two) times daily.    Dispense:  14 capsule    Refill:  0    Order Specific Question:   Supervising Provider    Answer:   [name] [[id number]]     Terald Sleeper, PA-C

## 2019-09-13 DIAGNOSIS — N2581 Secondary hyperparathyroidism of renal origin: Secondary | ICD-10-CM | POA: Diagnosis not present

## 2019-09-13 DIAGNOSIS — I129 Hypertensive chronic kidney disease with stage 1 through stage 4 chronic kidney disease, or unspecified chronic kidney disease: Secondary | ICD-10-CM | POA: Diagnosis not present

## 2019-09-13 DIAGNOSIS — D649 Anemia, unspecified: Secondary | ICD-10-CM | POA: Diagnosis not present

## 2019-09-13 DIAGNOSIS — N183 Chronic kidney disease, stage 3 unspecified: Secondary | ICD-10-CM | POA: Diagnosis not present

## 2019-09-22 DIAGNOSIS — L0390 Cellulitis, unspecified: Secondary | ICD-10-CM | POA: Diagnosis not present

## 2019-10-04 ENCOUNTER — Telehealth: Payer: Self-pay | Admitting: Family Medicine

## 2019-10-04 NOTE — Telephone Encounter (Signed)
Appt scheduled

## 2019-10-05 ENCOUNTER — Other Ambulatory Visit: Payer: Self-pay

## 2019-10-05 ENCOUNTER — Other Ambulatory Visit (HOSPITAL_COMMUNITY): Payer: PPO

## 2019-10-05 ENCOUNTER — Inpatient Hospital Stay (HOSPITAL_COMMUNITY): Payer: PPO | Attending: Hematology

## 2019-10-05 DIAGNOSIS — I4891 Unspecified atrial fibrillation: Secondary | ICD-10-CM | POA: Insufficient documentation

## 2019-10-05 DIAGNOSIS — Z79811 Long term (current) use of aromatase inhibitors: Secondary | ICD-10-CM | POA: Insufficient documentation

## 2019-10-05 DIAGNOSIS — Z170 Estrogen receptor positive status [ER+]: Secondary | ICD-10-CM | POA: Insufficient documentation

## 2019-10-05 DIAGNOSIS — Z79899 Other long term (current) drug therapy: Secondary | ICD-10-CM | POA: Insufficient documentation

## 2019-10-05 DIAGNOSIS — N183 Chronic kidney disease, stage 3 unspecified: Secondary | ICD-10-CM | POA: Insufficient documentation

## 2019-10-05 DIAGNOSIS — E785 Hyperlipidemia, unspecified: Secondary | ICD-10-CM | POA: Diagnosis not present

## 2019-10-05 DIAGNOSIS — M1990 Unspecified osteoarthritis, unspecified site: Secondary | ICD-10-CM | POA: Diagnosis not present

## 2019-10-05 DIAGNOSIS — D61818 Other pancytopenia: Secondary | ICD-10-CM | POA: Insufficient documentation

## 2019-10-05 DIAGNOSIS — I129 Hypertensive chronic kidney disease with stage 1 through stage 4 chronic kidney disease, or unspecified chronic kidney disease: Secondary | ICD-10-CM | POA: Insufficient documentation

## 2019-10-05 DIAGNOSIS — E039 Hypothyroidism, unspecified: Secondary | ICD-10-CM | POA: Diagnosis not present

## 2019-10-05 DIAGNOSIS — J45909 Unspecified asthma, uncomplicated: Secondary | ICD-10-CM | POA: Insufficient documentation

## 2019-10-05 DIAGNOSIS — C50511 Malignant neoplasm of lower-outer quadrant of right female breast: Secondary | ICD-10-CM | POA: Diagnosis not present

## 2019-10-05 LAB — CBC WITH DIFFERENTIAL/PLATELET
Abs Immature Granulocytes: 0.01 10*3/uL (ref 0.00–0.07)
Basophils Absolute: 0 10*3/uL (ref 0.0–0.1)
Basophils Relative: 0 %
Eosinophils Absolute: 0 10*3/uL (ref 0.0–0.5)
Eosinophils Relative: 1 %
HCT: 33.5 % — ABNORMAL LOW (ref 36.0–46.0)
Hemoglobin: 10.7 g/dL — ABNORMAL LOW (ref 12.0–15.0)
Immature Granulocytes: 0 %
Lymphocytes Relative: 21 %
Lymphs Abs: 0.7 10*3/uL (ref 0.7–4.0)
MCH: 28.8 pg (ref 26.0–34.0)
MCHC: 31.9 g/dL (ref 30.0–36.0)
MCV: 90.3 fL (ref 80.0–100.0)
Monocytes Absolute: 0.3 10*3/uL (ref 0.1–1.0)
Monocytes Relative: 9 %
Neutro Abs: 2.3 10*3/uL (ref 1.7–7.7)
Neutrophils Relative %: 69 %
Platelets: 122 10*3/uL — ABNORMAL LOW (ref 150–400)
RBC: 3.71 MIL/uL — ABNORMAL LOW (ref 3.87–5.11)
RDW: 13.3 % (ref 11.5–15.5)
WBC: 3.3 10*3/uL — ABNORMAL LOW (ref 4.0–10.5)
nRBC: 0 % (ref 0.0–0.2)

## 2019-10-07 ENCOUNTER — Ambulatory Visit: Payer: PPO | Admitting: Family Medicine

## 2019-10-07 ENCOUNTER — Ambulatory Visit (HOSPITAL_COMMUNITY): Payer: PPO | Admitting: Nurse Practitioner

## 2019-10-08 ENCOUNTER — Other Ambulatory Visit: Payer: Self-pay | Admitting: Cardiology

## 2019-10-11 ENCOUNTER — Ambulatory Visit (HOSPITAL_COMMUNITY): Payer: PPO | Admitting: Hematology

## 2019-10-11 ENCOUNTER — Other Ambulatory Visit: Payer: Self-pay

## 2019-10-11 ENCOUNTER — Other Ambulatory Visit: Payer: Self-pay | Admitting: Family Medicine

## 2019-10-11 ENCOUNTER — Other Ambulatory Visit: Payer: PPO

## 2019-10-11 DIAGNOSIS — N1832 Chronic kidney disease, stage 3b: Secondary | ICD-10-CM

## 2019-10-11 DIAGNOSIS — D696 Thrombocytopenia, unspecified: Secondary | ICD-10-CM

## 2019-10-11 DIAGNOSIS — E034 Atrophy of thyroid (acquired): Secondary | ICD-10-CM

## 2019-10-11 DIAGNOSIS — R300 Dysuria: Secondary | ICD-10-CM

## 2019-10-11 DIAGNOSIS — D631 Anemia in chronic kidney disease: Secondary | ICD-10-CM

## 2019-10-11 LAB — URINALYSIS, COMPLETE
Bilirubin, UA: NEGATIVE
Glucose, UA: NEGATIVE
Ketones, UA: NEGATIVE
Leukocytes,UA: NEGATIVE
Nitrite, UA: NEGATIVE
Protein,UA: NEGATIVE
Specific Gravity, UA: 1.025 (ref 1.005–1.030)
Urobilinogen, Ur: 0.2 mg/dL (ref 0.2–1.0)
pH, UA: 7 (ref 5.0–7.5)

## 2019-10-11 LAB — MICROSCOPIC EXAMINATION: Renal Epithel, UA: NONE SEEN /hpf

## 2019-10-12 ENCOUNTER — Ambulatory Visit (HOSPITAL_COMMUNITY): Payer: PPO | Admitting: Hematology

## 2019-10-12 ENCOUNTER — Ambulatory Visit (HOSPITAL_COMMUNITY): Payer: PPO | Admitting: Nurse Practitioner

## 2019-10-12 ENCOUNTER — Other Ambulatory Visit: Payer: Self-pay | Admitting: Family Medicine

## 2019-10-12 DIAGNOSIS — D631 Anemia in chronic kidney disease: Secondary | ICD-10-CM | POA: Diagnosis not present

## 2019-10-12 DIAGNOSIS — N1832 Chronic kidney disease, stage 3b: Secondary | ICD-10-CM | POA: Diagnosis not present

## 2019-10-12 LAB — CMP14+EGFR
ALT: 10 IU/L (ref 0–32)
AST: 19 IU/L (ref 0–40)
Albumin/Globulin Ratio: 1.9 (ref 1.2–2.2)
Albumin: 4.5 g/dL (ref 3.6–4.6)
Alkaline Phosphatase: 70 IU/L (ref 39–117)
BUN/Creatinine Ratio: 14 (ref 12–28)
BUN: 23 mg/dL (ref 8–27)
Bilirubin Total: 0.6 mg/dL (ref 0.0–1.2)
CO2: 27 mmol/L (ref 20–29)
Calcium: 9.8 mg/dL (ref 8.7–10.3)
Chloride: 95 mmol/L — ABNORMAL LOW (ref 96–106)
Creatinine, Ser: 1.59 mg/dL — ABNORMAL HIGH (ref 0.57–1.00)
GFR calc Af Amer: 34 mL/min/{1.73_m2} — ABNORMAL LOW (ref >59)
GFR calc non Af Amer: 30 mL/min/{1.73_m2} — ABNORMAL LOW (ref >59)
Globulin, Total: 2.4 g/dL (ref 1.5–4.5)
Glucose: 94 mg/dL (ref 65–99)
Potassium: 3.9 mmol/L (ref 3.5–5.2)
Sodium: 137 mmol/L (ref 134–144)
Total Protein: 6.9 g/dL (ref 6.0–8.5)

## 2019-10-12 LAB — SPECIMEN STATUS

## 2019-10-12 LAB — THYROID PANEL WITH TSH
Free Thyroxine Index: 3 (ref 1.2–4.9)
T3 Uptake Ratio: 30 % (ref 24–39)
T4, Total: 10 ug/dL (ref 4.5–12.0)
TSH: 5.25 u[IU]/mL — ABNORMAL HIGH (ref 0.450–4.500)

## 2019-10-12 LAB — SPECIMEN STATUS REPORT

## 2019-10-13 ENCOUNTER — Other Ambulatory Visit: Payer: Self-pay

## 2019-10-13 ENCOUNTER — Inpatient Hospital Stay (HOSPITAL_BASED_OUTPATIENT_CLINIC_OR_DEPARTMENT_OTHER): Payer: PPO | Admitting: Nurse Practitioner

## 2019-10-13 DIAGNOSIS — Z170 Estrogen receptor positive status [ER+]: Secondary | ICD-10-CM | POA: Diagnosis not present

## 2019-10-13 DIAGNOSIS — C50511 Malignant neoplasm of lower-outer quadrant of right female breast: Secondary | ICD-10-CM

## 2019-10-13 LAB — CBC WITH DIFFERENTIAL/PLATELET
Basophils Absolute: 0 10*3/uL (ref 0.0–0.2)
Basos: 0 %
EOS (ABSOLUTE): 0 10*3/uL (ref 0.0–0.4)
Eos: 0 %
Hematocrit: 34.8 % (ref 34.0–46.6)
Hemoglobin: 11.4 g/dL (ref 11.1–15.9)
Immature Grans (Abs): 0 10*3/uL (ref 0.0–0.1)
Immature Granulocytes: 0 %
Lymphocytes Absolute: 0.8 10*3/uL (ref 0.7–3.1)
Lymphs: 34 %
MCH: 29.7 pg (ref 26.6–33.0)
MCHC: 32.8 g/dL (ref 31.5–35.7)
MCV: 91 fL (ref 79–97)
Monocytes Absolute: 0.3 10*3/uL (ref 0.1–0.9)
Monocytes: 11 %
Neutrophils Absolute: 1.3 10*3/uL — ABNORMAL LOW (ref 1.4–7.0)
Neutrophils: 55 %
RBC: 3.84 x10E6/uL (ref 3.77–5.28)
RDW: 12.4 % (ref 11.7–15.4)
WBC: 2.4 10*3/uL — CL (ref 3.4–10.8)

## 2019-10-13 NOTE — Progress Notes (Signed)
[address] [place], [place] [address]   CLINIC:  Medical Oncology/Hematology  PCP:  [name], DO [place] [id number] [phone]   REASON FOR VISIT: Follow-up for breast cancer  CURRENT THERAPY: Letrozole  BRIEF ONCOLOGIC HISTORY:  Oncology History Overview Note  Cancer Staging Malignant neoplasm of lower-outer quadrant of right breast of female, estrogen receptor positive ([place]) Staging form: Breast, AJCC 8th Edition - Clinical stage from 02/22/2018: Stage IA (cT1c, cN0, cM0, G1, ER+, PR+, HER2-) - Signed by Truitt Merle, MD on 03/02/2018    Malignant neoplasm of lower-outer quadrant of right breast of female, estrogen receptor positive ([place])  02/12/2018 Mammogram   She had routine screening bilateral mammography on 02/12/2018 at [facility] with results showing: indeterminate irregular mass in the right breast.    02/16/2018 Mammogram   She underwent right diagnostic mammography with tomography and right breast ultrasonography at [facility] on 02/16/2018 showing: 1.1 cm irregular mass at the 8 O'clock position on the right breast   02/22/2018 Pathology Results   Diagnosis 1. Breast, right, needle core biopsy, lateral, 8 o'clock - INVASIVE AND IN SITU DUCTAL CARCINOMA. - SEE COMMENT. 2. Breast, right, needle core biopsy, 11 o'clock - FIBROCYSTIC CHANGES. - USUAL DUCTAL HYPERPLASIA. - THERE IS NO EVIDENCE OF MALIGNANCY.   02/22/2018 Receptors her2   IMMUNOHISTOCHEMICAL AND MORPHOMETRIC ANALYSIS PERFORMED MANUALLY Estrogen Receptor: 100%, POSITIVE, STRONG STAINING INTENSITY Progesterone Receptor: 40%, POSITIVE, MODERATE STAINING INTENSITY Proliferation Marker Ki67: 1% Her2 Negative   02/22/2018 Cancer Staging   Staging form: Breast, AJCC 8th Edition - Clinical stage from 02/22/2018: Stage IA (cT1c, cN0, cM0, G1, ER+, PR+, HER2-) - Signed by Truitt Merle, MD on 03/02/2018    02/25/2018 Initial Diagnosis   Malignant neoplasm of lower-outer  quadrant of right breast of female, estrogen receptor positive ([place])      CANCER STAGING: Cancer Staging Malignant neoplasm of lower-outer quadrant of right breast of female, estrogen receptor positive ([place]) Staging form: Breast, AJCC 8th Edition - Clinical stage from 02/22/2018: Stage IA (cT1c, cN0, cM0, G1, ER+, PR+, HER2-) - Signed by Truitt Merle, MD on 03/02/2018    INTERVAL HISTORY:  [name] 86 y.o. female was called for a telephone visit today to follow-up with her breast cancer.  Patient reports she is doing well since her last visit.  She has no complaints at this time.  She is taking her medication as prescribed. Denies any nausea, vomiting, or diarrhea. Denies any new pains. Had not noticed any recent bleeding such as epistaxis, hematuria or hematochezia. Denies recent chest pain on exertion, shortness of breath on minimal exertion, pre-syncopal episodes, or palpitations. Denies any numbness or tingling in hands or feet. Denies any recent fevers, infections, or recent hospitalizations. Patient reports appetite at 100% and energy level at 100%.  She is eating well maintain her weight at this time.    REVIEW OF SYSTEMS:  Review of Systems  All other systems reviewed and are negative.    PAST MEDICAL/SURGICAL HISTORY:  Past Medical History:  Diagnosis Date  . Anemia   . Arthritis   . Asthma   . Atrial fibrillation ([place])   . CKD (chronic kidney disease), stage III   . Heart murmur   . Hyperlipidemia   . Hypertension   . Hypothyroidism   . Nodule of right lung    Right upper lobe  . PONV (postoperative nausea and vomiting)   . Renal insufficiency    Chronic  .  Renal vascular disease   . Right renal artery stenosis ([place]) 05/09/2015  . Vertigo    Past Surgical History:  Procedure Laterality Date  . BREAST LUMPECTOMY WITH RADIOACTIVE SEED LOCALIZATION Right 04/08/2018   Procedure: BREAST LUMPECTOMY WITH RADIOACTIVE SEED LOCALIZATION;  Surgeon: Rolm Bookbinder, MD;   Location: [facility];  Service: General;  Laterality: Right;  . IR GENERIC HISTORICAL  08/29/2016   IR US GUIDE VASC ACCESS RIGHT 08/29/2016 [facility]INTER[facility]  . IR GENERIC HISTORICAL  08/29/2016   IR RENAL BILAT S&I MOD SED 08/29/2016 [facility]  . KNEE ARTHROSCOPY     2019  . THYROIDECTOMY, PARTIAL Right 1981   middle lobe removed 1st, then right lobe removed 7-8 years later (approx. 1988)     SOCIAL HISTORY:  Social History   Socioeconomic History  . Marital status: Married    Spouse name: Not on file  . Number of children: 2  . Years of education: 28  . Highest education level: Not on file  Occupational History  . Occupation: Retired  Tobacco Use  . Smoking status: Never Smoker  . Smokeless tobacco: Never Used  . Tobacco comment: Pt denies cigarettes  Substance and Sexual Activity  . Alcohol use: No  . Drug use: No  . Sexual activity: Never  Other Topics Concern  . Not on file  Social History Narrative   Lives at home with husband.   Ambidextrous (writes with left hand).   No caffeine use.   Social Determinants of Health   Financial Resource Strain:   . Difficulty of Paying Living Expenses: Not on file  Food Insecurity:   . Worried About Charity fundraiser in the Last Year: Not on file  . Ran Out of Food in the Last Year: Not on file  Transportation Needs:   . Lack of Transportation (Medical): Not on file  . Lack of Transportation (Non-Medical): Not on file  Physical Activity:   . Days of Exercise per Week: Not on file  . Minutes of Exercise per Session: Not on file  Stress:   . Feeling of Stress : Not on file  Social Connections:   . Frequency of Communication with Friends and Family: Not on file  . Frequency of Social Gatherings with Friends and Family: Not on file  . Attends Religious Services: Not on file  . Active Member of Clubs or Organizations: Not on file  . Attends Archivist Meetings: Not on file  . Marital Status: Not on file  Intimate  Partner Violence:   . Fear of Current or Ex-Partner: Not on file  . Emotionally Abused: Not on file  . Physically Abused: Not on file  . Sexually Abused: Not on file    FAMILY HISTORY:  Family History  Problem Relation Age of Onset  . Thyroid disease Mother   . Stroke Father   . Hypertension Brother   . Lung cancer Maternal Uncle   . Cancer Maternal Grandmother        Liver  . Lung cancer Maternal Uncle   . Hypertension Daughter   . Hypercholesterolemia Daughter   . Hypercholesterolemia Daughter     CURRENT MEDICATIONS:  Outpatient Encounter Medications as of 10/13/2019  Medication Sig Note  . acetaminophen (TYLENOL) 325 MG tablet Take 325 mg by mouth daily as needed for moderate pain or headache.   Marland Kitchen atenolol-chlorthalidone (TENORETIC) 50-25 MG tablet TAKE 1 TABLET BY MOUTH EVERY DAY   . atorvastatin (LIPITOR) 20 MG tablet TAKE 1/2  TABLET BY MOUTH EVERY DAY   . calcium-vitamin D (OSCAL WITH D) 500-200 MG-UNIT per tablet Take 2 tablets by mouth at bedtime.    . cephALEXin (KEFLEX) 250 MG capsule Take 1 capsule (250 mg total) by mouth 2 (two) times daily.   . cholecalciferol (VITAMIN D) 1000 units tablet Take 1,000 Units by mouth daily.   . ciclopirox (PENLAC) 8 % solution APPLY TO ALL TOENAILS DAILY, ONCE WEEKLY CLEANSE NAILS THOROUGHLY WITH NAIL POLISH REMOVER   . ferrous sulfate 325 (65 FE) MG tablet Take 325 mg by mouth 2 (two) times daily with a meal.   . flecainide (TAMBOCOR) 50 MG tablet TAKE 1 TABLET BY MOUTH TWICE A DAY   . fluticasone (FLONASE) 50 MCG/ACT nasal spray Place 1 spray into both nostrils 2 (two) times daily.    . hydrALAZINE (APRESOLINE) 100 MG tablet Take 100 mg by mouth 2 (two) times daily.   . hydroxypropyl methylcellulose / hypromellose (ISOPTO TEARS / GONIOVISC) 2.5 % ophthalmic solution Place 1 drop into both eyes daily.    Marland Kitchen letrozole (FEMARA) 2.5 MG tablet Take 1 tablet (2.5 mg total) by mouth daily.   Marland Kitchen levothyroxine (SYNTHROID) 75 MCG tablet Take 1  tablet (75 mcg total) by mouth daily.   Marland Kitchen lisinopril (PRINIVIL,ZESTRIL) 40 MG tablet Take 1 tablet by mouth daily. 02/08/2019: Blood  pressure was low.  . ondansetron (ZOFRAN) 4 MG tablet Take 1 tablet (4 mg total) by mouth every 8 (eight) hours as needed for nausea or vomiting.    No facility-administered encounter medications on file as of 10/13/2019.    ALLERGIES:  Allergies  Allergen Reactions  . Amlodipine Other (See Comments)    edema  . Isosorbide Nitrate Nausea Only  . Sulfonamide Derivatives Nausea Only and Rash  . Terazosin Hives and Nausea Only    Vital signs: -Deferred due to telephone visit  Physical Exam -Deferred due to telephone visit -Patient was alert and oriented over the phone and in no acute distress.  LABORATORY DATA:  I have reviewed the labs as listed.  CBC    Component Value Date/Time   WBC 2.4 (LL) 10/12/2019 0000   WBC 3.3 (L) 10/05/2019 1054   RBC 3.84 10/12/2019 0000   RBC 3.71 (L) 10/05/2019 1054   HGB 11.4 10/12/2019 0000   HCT 34.8 10/12/2019 0000   PLT CANCELED 10/12/2019 0000   MCV 91 10/12/2019 0000   MCH 29.7 10/12/2019 0000   MCH 28.8 10/05/2019 1054   MCHC 32.8 10/12/2019 0000   MCHC 31.9 10/05/2019 1054   RDW 12.4 10/12/2019 0000   LYMPHSABS 0.8 10/12/2019 0000   MONOABS 0.3 10/05/2019 1054   EOSABS 0.0 10/12/2019 0000   BASOSABS 0.0 10/12/2019 0000   CMP Latest Ref Rng & Units 10/11/2019 06/14/2019 05/27/2019  Glucose 65 - 99 mg/dL 94 104(H) 110(H)  BUN 8 - 27 mg/dL 23 27 36(H)  Creatinine 0.57 - 1.00 mg/dL 1.59(H) 1.42(H) 1.43(H)  Sodium 134 - 144 mmol/L 137 140 138  Potassium 3.5 - 5.2 mmol/L 3.9 4.0 3.5  Chloride 96 - 106 mmol/L 95(L) 98 99  CO2 20 - 29 mmol/L '27 29 29  ' Calcium 8.7 - 10.3 mg/dL 9.8 9.7 9.4  Total Protein 6.0 - 8.5 g/dL 6.9 7.2 7.8  Total Bilirubin 0.0 - 1.2 mg/dL 0.6 0.5 0.6  Alkaline Phos 39 - 117 IU/L 70 65 60  AST 0 - 40 IU/L '19 18 22  ' ALT 0 - 32 IU/L 10 11 14  All questions were answered to  patient's stated satisfaction. Encouraged patient to call with any new concerns or questions before his next visit to the cancer center and we can certain see him sooner, if needed.     ASSESSMENT & PLAN:   Malignant neoplasm of lower-outer quadrant of right breast of female, estrogen receptor positive ([place]) 1.  Stage I right breast cancer: -She had a mammogram on 02/16/2018 which showed B RADS category 0 incomplete.  She was referred for a biopsy. - Right breast biopsy on 02/22/2018 consistent with IDC. -Right breast lumpectomy on 04/08/2018, IDC, 1.1 cm, grade 1, margins negative, ER/PR positive, HER-2 negative, Ki-67 1% -Letrozole started on 05/28/2018.  She is tolerating this well. -She declined adjuvant RT. -Her mammogram on 03/11/2019 was negative for any disease.  She has her mammograms done with [facility].  They will fax [place] a copy. -Labs done on 10/12/2019 showed WBC 2.4, hemoglobin 11.4 - She will follow-up in 6 months with repeat labs.  2.  Pancytopenia: -Mild thrombocytopenia since 2015.  Leukopenia since 2014. She also has normocytic anemia. - Etiology could be from MDS as well as CKD. - Labs on 10/11/2019 showed her potassium 3.9, creatinine 1.59, WBC 2.4, hemoglobin 11.4, platelets 122.  -We will recheck labs in 6 months.  3.  Health maintenance: -DEXA scan done 03/07/2019 showed a T score of -1.0 -Plan is to repeat in 2 years. -She will continue calcium and vitamin D daily.      Orders placed this encounter:  Orders Placed This Encounter  Procedures  . Lactate dehydrogenase  . CBC with Differential/Platelet  . Comprehensive metabolic panel  . Ferritin  . Iron and TIBC  . Vitamin B12  . VITAMIN D 25 Hydroxy (Vit-D Deficiency, Fractures)     I provided 22 minutes of non face-to-face telephone visit time during this encounter, and > 50% was spent counseling as documented under my assessment & plan.    Francene Finders, FNP-C [place] [phone]

## 2019-10-13 NOTE — Patient Instructions (Signed)
[place] at [facility] Discharge Instructions  Follow up in 5 months with labs and mammogram    Thank you for choosing [facility] at [facility] to provide your oncology and hematology care.  To afford each patient quality time with our provider, please arrive at least 15 minutes before your scheduled appointment time.   If you have a lab appointment with the [facility] please come in thru the Main Entrance and check in at the main information desk.  You need to re-schedule your appointment should you arrive 10 or more minutes late.  We strive to give you quality time with our providers, and arriving late affects you and other patients whose appointments are after yours.  Also, if you no show three or more times for appointments you may be dismissed from the clinic at the providers discretion.     Again, thank you for choosing [facility].  Our hope is that these requests will decrease the amount of time that you wait before being seen by our physicians.       _____________________________________________________________  Should you have questions after your visit to [facility], please contact our office at [phone] between the hours of 8:00 a.m. and 4:30 p.m.  Voicemails left after 4:00 p.m. will not be returned until the following business day.  For prescription refill requests, have your pharmacy contact our office and allow 72 hours.    Due to Covid, you will need to wear a mask upon entering the hospital. If you do not have a mask, a mask will be given to you at the Main Entrance upon arrival. For doctor visits, patients may have 1 support person with them. For treatment visits, patients can not have anyone with them due to social distancing guidelines and our immunocompromised population.

## 2019-10-13 NOTE — Assessment & Plan Note (Addendum)
1.  Stage I right breast cancer: -She had a mammogram on 02/16/2018 which showed B RADS category 0 incomplete.  She was referred for a biopsy. - Right breast biopsy on 02/22/2018 consistent with IDC. -Right breast lumpectomy on 04/08/2018, IDC, 1.1 cm, grade 1, margins negative, ER/PR positive, HER-2 negative, Ki-67 1% -Letrozole started on 05/28/2018.  She is tolerating this well. -She declined adjuvant RT. -Her mammogram on 03/11/2019 was negative for any disease.  She has her mammograms done with [facility].  They will fax [place] a copy. -Labs done on 10/12/2019 showed WBC 2.4, hemoglobin 11.4 - She will follow-up in 6 months with repeat labs.  2.  Pancytopenia: -Mild thrombocytopenia since 2015.  Leukopenia since 2014. She also has normocytic anemia. - Etiology could be from MDS as well as CKD. - Labs on 10/11/2019 showed her potassium 3.9, creatinine 1.59, WBC 2.4, hemoglobin 11.4, platelets 122.  -We will recheck labs in 6 months.  3.  Health maintenance: -DEXA scan done 03/07/2019 showed a T score of -1.0 -Plan is to repeat in 2 years. -She will continue calcium and vitamin D daily.

## 2019-10-15 ENCOUNTER — Other Ambulatory Visit: Payer: Self-pay | Admitting: Family Medicine

## 2019-10-15 DIAGNOSIS — I10 Essential (primary) hypertension: Secondary | ICD-10-CM

## 2019-10-17 ENCOUNTER — Other Ambulatory Visit: Payer: Self-pay

## 2019-10-18 ENCOUNTER — Encounter: Payer: Self-pay | Admitting: Family Medicine

## 2019-10-18 ENCOUNTER — Ambulatory Visit (INDEPENDENT_AMBULATORY_CARE_PROVIDER_SITE_OTHER): Payer: PPO | Admitting: Family Medicine

## 2019-10-18 VITALS — BP 152/82 | HR 60 | Temp 98.6°F | Ht 65.0 in | Wt 115.0 lb

## 2019-10-18 DIAGNOSIS — N1831 Chronic kidney disease, stage 3a: Secondary | ICD-10-CM

## 2019-10-18 DIAGNOSIS — E034 Atrophy of thyroid (acquired): Secondary | ICD-10-CM

## 2019-10-18 DIAGNOSIS — R238 Other skin changes: Secondary | ICD-10-CM

## 2019-10-18 DIAGNOSIS — I701 Atherosclerosis of renal artery: Secondary | ICD-10-CM | POA: Diagnosis not present

## 2019-10-18 DIAGNOSIS — I480 Paroxysmal atrial fibrillation: Secondary | ICD-10-CM

## 2019-10-18 DIAGNOSIS — I10 Essential (primary) hypertension: Secondary | ICD-10-CM

## 2019-10-18 DIAGNOSIS — R233 Spontaneous ecchymoses: Secondary | ICD-10-CM

## 2019-10-18 NOTE — Progress Notes (Signed)
Subjective: CC: f/u Hypothyroidism, CKD3 PCP: [name], DO PVV:ZSMOL B Dockery is a 86 y.o. female presenting to clinic today for:  1. Hypothyroidism Patient compliant with thyroid medication.  She has history of thyroid surgery.  Denies any unplanned weight change, change in bowel habits, heart palpitations, insomnia.  She has been stable on Synthroid 75 mcg for quite some time.  2. HTN w/ CKD3, RAS, atrial fibrillation Reports compliance with Tenoretic 50-25, Tambocor, hydralazine and lisinopril.  No chest pain, shortness of breath, change in exercise tolerance, loss of consciousness or dizziness.  She is followed by Dr. Percival Spanish for her A. fib.  3.  Easy bruising Patient reports a many year history of easy bruisability.  She notes that she easily develops hematomas and cites that she recently knocked the back of her right calf against a jeep and subsequently had a very large bruise develop.  The bruises always leave behind a brown skin and she worries about how this looks.  She is been told in the past that bruising occurs because of thin skin.  She sees a hematologist for her thrombocytopenia.  Last platelet level was noted to be 122 K.  She also notes a new lesion on her left shin.  She previously had a biopsy of a lesion slightly lower to this 1 that was a skin cancer, but was "not the bad kind".  She had quite a difficult time healing that lesion because it tended to ooze after the biopsy.  She worries about getting another biopsy.   ROS: Per HPI  Allergies  Allergen Reactions  . Amlodipine Other (See Comments)    edema  . Isosorbide Nitrate Nausea Only  . Sulfonamide Derivatives Nausea Only and Rash  . Terazosin Hives and Nausea Only   Past Medical History:  Diagnosis Date  . Anemia   . Arthritis   . Asthma   . Atrial fibrillation ([place])   . CKD (chronic kidney disease), stage III   . Heart murmur   . Hyperlipidemia   . Hypertension   . Hypothyroidism   .  Nodule of right lung    Right upper lobe  . PONV (postoperative nausea and vomiting)   . Renal insufficiency    Chronic  . Renal vascular disease   . Right renal artery stenosis ([place]) 05/09/2015  . Vertigo     Current Outpatient Medications:  .  acetaminophen (TYLENOL) 325 MG tablet, Take 325 mg by mouth daily as needed for moderate pain or headache., Disp: , Rfl:  .  atenolol-chlorthalidone (TENORETIC) 50-25 MG tablet, TAKE 1 TABLET BY MOUTH EVERY DAY, Disp: 90 tablet, Rfl: 1 .  atorvastatin (LIPITOR) 20 MG tablet, TAKE 1/2 TABLET BY MOUTH EVERY DAY, Disp: 45 tablet, Rfl: 1 .  calcium-vitamin D (OSCAL WITH D) 500-200 MG-UNIT per tablet, Take 2 tablets by mouth at bedtime. , Disp: , Rfl:  .  cephALEXin (KEFLEX) 250 MG capsule, Take 1 capsule (250 mg total) by mouth 2 (two) times daily., Disp: 14 capsule, Rfl: 0 .  cholecalciferol (VITAMIN D) 1000 units tablet, Take 1,000 Units by mouth daily., Disp: , Rfl:  .  ciclopirox (PENLAC) 8 % solution, APPLY TO ALL TOENAILS DAILY, ONCE WEEKLY CLEANSE NAILS THOROUGHLY WITH NAIL POLISH REMOVER, Disp: , Rfl:  .  ferrous sulfate 325 (65 FE) MG tablet, Take 325 mg by mouth 2 (two) times daily with a meal., Disp: , Rfl:  .  flecainide (TAMBOCOR) 50 MG tablet, TAKE 1 TABLET BY MOUTH  TWICE A DAY, Disp: 180 tablet, Rfl: 2 .  fluticasone (FLONASE) 50 MCG/ACT nasal spray, Place 1 spray into both nostrils 2 (two) times daily. , Disp: , Rfl:  .  hydrALAZINE (APRESOLINE) 100 MG tablet, Take 100 mg by mouth 2 (two) times daily., Disp: , Rfl:  .  hydroxypropyl methylcellulose / hypromellose (ISOPTO TEARS / GONIOVISC) 2.5 % ophthalmic solution, Place 1 drop into both eyes daily. , Disp: , Rfl:  .  letrozole (FEMARA) 2.5 MG tablet, Take 1 tablet (2.5 mg total) by mouth daily., Disp: 90 tablet, Rfl: 4 .  levothyroxine (SYNTHROID) 75 MCG tablet, Take 1 tablet (75 mcg total) by mouth daily., Disp: 90 tablet, Rfl: 1 .  lisinopril (PRINIVIL,ZESTRIL) 40 MG tablet, Take 1  tablet by mouth daily., Disp: , Rfl:  .  ondansetron (ZOFRAN) 4 MG tablet, Take 1 tablet (4 mg total) by mouth every 8 (eight) hours as needed for nausea or vomiting., Disp: 20 tablet, Rfl: 0 Social History   Socioeconomic History  . Marital status: Married    Spouse name: Not on file  . Number of children: 2  . Years of education: 71  . Highest education level: Not on file  Occupational History  . Occupation: Retired  Tobacco Use  . Smoking status: Never Smoker  . Smokeless tobacco: Never Used  . Tobacco comment: Pt denies cigarettes  Substance and Sexual Activity  . Alcohol use: No  . Drug use: No  . Sexual activity: Never  Other Topics Concern  . Not on file  Social History Narrative   Lives at home with husband.   Ambidextrous (writes with left hand).   No caffeine use.   Social Determinants of Health   Financial Resource Strain:   . Difficulty of Paying Living Expenses: Not on file  Food Insecurity:   . Worried About Charity fundraiser in the Last Year: Not on file  . Ran Out of Food in the Last Year: Not on file  Transportation Needs:   . Lack of Transportation (Medical): Not on file  . Lack of Transportation (Non-Medical): Not on file  Physical Activity:   . Days of Exercise per Week: Not on file  . Minutes of Exercise per Session: Not on file  Stress:   . Feeling of Stress : Not on file  Social Connections:   . Frequency of Communication with Friends and Family: Not on file  . Frequency of Social Gatherings with Friends and Family: Not on file  . Attends Religious Services: Not on file  . Active Member of Clubs or Organizations: Not on file  . Attends Archivist Meetings: Not on file  . Marital Status: Not on file  Intimate Partner Violence:   . Fear of Current or Ex-Partner: Not on file  . Emotionally Abused: Not on file  . Physically Abused: Not on file  . Sexually Abused: Not on file   Family History  Problem Relation Age of Onset  .  Thyroid disease Mother   . Stroke Father   . Hypertension Brother   . Lung cancer Maternal Uncle   . Cancer Maternal Grandmother        Liver  . Lung cancer Maternal Uncle   . Hypertension Daughter   . Hypercholesterolemia Daughter   . Hypercholesterolemia Daughter     Objective: Office vital signs reviewed. BP (!) 152/82 Comment: MANUAL  Pulse 60   Temp 98.6 F (37 C) (Temporal)   Ht 5\' 5"  (1.651 m)  Wt 115 lb (52.2 kg)   SpO2 95%   BMI 19.14 kg/m   Physical Examination:  General: Awake, alert, well appearing, thin elderly female, No acute distress HEENT: Normal, sclera white, MMM; no exophthalmos.  No goiter Cardio: seemingly regular rate and rhythm, S1S2 heard, 2/6 SEM at bilateral sternal borders that radiates to bilateral carotids noted Pulm: clear to auscultation bilaterally, no wheezes, rhonchi or rales; normal work of breathing on room air Extremities: warm, well perfused, No edema, cyanosis or clubbing; +2 pulses bilaterally Skin: Hemosiderin deposition noted along the anterior shins bilaterally.  She has a very large bruise that is along the posterior right calf.  No palpable hematoma noted.  Assessment/ Plan: 86 y.o. female   1. Paroxysmal atrial fibrillation (HCC) Rate controlled.  2. Right renal artery stenosis (HCC) Blood pressure is slightly elevated today.  We will continue to monitor but if persistently elevated, low threshold to adjust blood pressure medication.  3. Stage 3a chronic kidney disease - Ferritin; Future  4. Essential hypertension As above  5. Hypothyroidism due to acquired atrophy of thyroid Asymptomatic despite slight elevation in TSH.  We will plan to repeat in 4 months  6. Easy bruisability I suspect secondary to thin skin and low platelet level.  We discussed using caution to reduce bumping and bruising. - Ferritin; Future  Orders Placed This Encounter  Procedures  . Ferritin    Standing Status:   Future    Standing  Expiration Date:   10/17/2020  . Thyroid Panel With TSH    Standing Status:   Future    Standing Expiration Date:   10/17/2020  . CBC    Standing Status:   Future    Standing Expiration Date:   10/17/2020  . Basic metabolic panel    Standing Status:   Future    Standing Expiration Date:   10/17/2020   No orders of the defined types were placed in this encounter.    [name], DO [place] [phone]

## 2019-10-19 ENCOUNTER — Telehealth: Payer: Self-pay | Admitting: Family Medicine

## 2019-10-19 NOTE — Telephone Encounter (Signed)
Had a note to send lab results to Dr Posey Pronto but do not see a Dr Posey Pronto in her care team.  Called home number to ask which provider to send copies to.  [name] will check with her mom, [name], once she gets back from town and call tomorrow with name of doctor.

## 2019-10-20 ENCOUNTER — Telehealth: Payer: Self-pay | Admitting: Family Medicine

## 2019-10-20 NOTE — Telephone Encounter (Signed)
Please make sure that patient's nephrologist, Dr Posey Pronto at [facility], receives her most recent labwork from 10/11/2019 and 10/12/2019.  Fax number below

## 2019-10-20 NOTE — Telephone Encounter (Signed)
Pt called stating that she talked to Dr Lajuana Ripple yesterday and was told that she was going to send pt's lab results to her Kidney doctor (Dr Posey Pronto) but didn't know where Dr Posey Pronto was located. Pt said to let Dr Lajuana Ripple know that Dr Posey Pronto is located at [facility]. Fax# [phone]

## 2019-10-20 NOTE — Telephone Encounter (Signed)
Faxed number.

## 2019-10-25 DIAGNOSIS — C44722 Squamous cell carcinoma of skin of right lower limb, including hip: Secondary | ICD-10-CM | POA: Diagnosis not present

## 2019-10-29 ENCOUNTER — Other Ambulatory Visit: Payer: Self-pay | Admitting: Nurse Practitioner

## 2019-11-16 ENCOUNTER — Encounter: Payer: Self-pay | Admitting: Dermatology

## 2019-11-16 ENCOUNTER — Encounter: Payer: Self-pay | Admitting: *Deleted

## 2019-11-16 ENCOUNTER — Ambulatory Visit (INDEPENDENT_AMBULATORY_CARE_PROVIDER_SITE_OTHER): Payer: PPO | Admitting: Dermatology

## 2019-11-16 ENCOUNTER — Other Ambulatory Visit: Payer: Self-pay

## 2019-11-16 DIAGNOSIS — L570 Actinic keratosis: Secondary | ICD-10-CM

## 2019-11-16 DIAGNOSIS — D099 Carcinoma in situ, unspecified: Secondary | ICD-10-CM

## 2019-11-16 DIAGNOSIS — D0472 Carcinoma in situ of skin of left lower limb, including hip: Secondary | ICD-10-CM | POA: Diagnosis not present

## 2019-11-16 DIAGNOSIS — Z85828 Personal history of other malignant neoplasm of skin: Secondary | ICD-10-CM

## 2019-11-17 NOTE — Progress Notes (Signed)
   Follow-Up Visit   Subjective  [name] is a 86 y.o. female who presents for the following: Follow-up (3 weeks follow up seen some imporvement left shin).  SCCA Location: Right shin Duration: 2 months Quality: Smaller Associated Signs/Symptoms: No pain Modifying Factors: Injected with fluorouracil Severity:  Timing: Context: History of aggressive KA below this lesion  The following portions of the chart were reviewed this encounter and updated as appropriate:     Objective  Well appearing patient in no apparent distress; mood and affect are within normal limits.  A focused examination was performed including head, arms, legs. Relevant physical exam findings are noted in the Assessment and Plan.   Assessment & Plan  AK (actinic keratosis) (2) Left Lower Leg - Anterior; Right Lower Leg - Anterior  Squamous cell carcinoma in situ Left Lower Leg - Anterior

## 2019-11-24 ENCOUNTER — Ambulatory Visit: Payer: PPO | Admitting: Physician Assistant

## 2019-11-24 DIAGNOSIS — B351 Tinea unguium: Secondary | ICD-10-CM | POA: Diagnosis not present

## 2019-11-24 DIAGNOSIS — M79676 Pain in unspecified toe(s): Secondary | ICD-10-CM | POA: Diagnosis not present

## 2019-12-07 ENCOUNTER — Telehealth: Payer: Self-pay | Admitting: Dermatology

## 2019-12-07 NOTE — Telephone Encounter (Signed)
FYI: place on leg is about the same; it hasn't grown but it hasn't gone away either

## 2019-12-08 NOTE — Telephone Encounter (Signed)
As long as not growing, hurting, bleeding Dr. Darene Lamer would like to continue to watch.

## 2019-12-08 NOTE — Telephone Encounter (Signed)
Daughter showed [place] a picture of moms leg at her office visit and per ks everything looks good: will recheck in 4-6 weeks.

## 2019-12-20 ENCOUNTER — Other Ambulatory Visit: Payer: Self-pay

## 2019-12-20 ENCOUNTER — Other Ambulatory Visit: Payer: PPO

## 2019-12-20 DIAGNOSIS — N183 Chronic kidney disease, stage 3 unspecified: Secondary | ICD-10-CM | POA: Diagnosis not present

## 2019-12-30 DIAGNOSIS — H2513 Age-related nuclear cataract, bilateral: Secondary | ICD-10-CM | POA: Diagnosis not present

## 2019-12-30 DIAGNOSIS — N183 Chronic kidney disease, stage 3 unspecified: Secondary | ICD-10-CM | POA: Diagnosis not present

## 2019-12-30 DIAGNOSIS — N2581 Secondary hyperparathyroidism of renal origin: Secondary | ICD-10-CM | POA: Diagnosis not present

## 2019-12-30 DIAGNOSIS — D649 Anemia, unspecified: Secondary | ICD-10-CM | POA: Diagnosis not present

## 2019-12-30 DIAGNOSIS — I129 Hypertensive chronic kidney disease with stage 1 through stage 4 chronic kidney disease, or unspecified chronic kidney disease: Secondary | ICD-10-CM | POA: Diagnosis not present

## 2019-12-30 DIAGNOSIS — H40033 Anatomical narrow angle, bilateral: Secondary | ICD-10-CM | POA: Diagnosis not present

## 2020-01-02 ENCOUNTER — Other Ambulatory Visit: Payer: Self-pay | Admitting: Family Medicine

## 2020-01-02 DIAGNOSIS — E034 Atrophy of thyroid (acquired): Secondary | ICD-10-CM

## 2020-01-18 DIAGNOSIS — H40031 Anatomical narrow angle, right eye: Secondary | ICD-10-CM | POA: Diagnosis not present

## 2020-01-26 ENCOUNTER — Other Ambulatory Visit: Payer: Self-pay | Admitting: Family Medicine

## 2020-01-26 DIAGNOSIS — E034 Atrophy of thyroid (acquired): Secondary | ICD-10-CM

## 2020-01-31 ENCOUNTER — Ambulatory Visit: Payer: PPO | Admitting: Physician Assistant

## 2020-02-01 DIAGNOSIS — H40032 Anatomical narrow angle, left eye: Secondary | ICD-10-CM | POA: Diagnosis not present

## 2020-02-13 ENCOUNTER — Other Ambulatory Visit: Payer: PPO

## 2020-02-13 ENCOUNTER — Other Ambulatory Visit: Payer: Self-pay

## 2020-02-13 DIAGNOSIS — I10 Essential (primary) hypertension: Secondary | ICD-10-CM

## 2020-02-13 DIAGNOSIS — R238 Other skin changes: Secondary | ICD-10-CM | POA: Diagnosis not present

## 2020-02-13 DIAGNOSIS — N1831 Chronic kidney disease, stage 3a: Secondary | ICD-10-CM

## 2020-02-13 DIAGNOSIS — R233 Spontaneous ecchymoses: Secondary | ICD-10-CM

## 2020-02-13 DIAGNOSIS — E034 Atrophy of thyroid (acquired): Secondary | ICD-10-CM | POA: Diagnosis not present

## 2020-02-14 LAB — BASIC METABOLIC PANEL
BUN/Creatinine Ratio: 20 (ref 12–28)
BUN: 31 mg/dL — ABNORMAL HIGH (ref 8–27)
CO2: 26 mmol/L (ref 20–29)
Calcium: 9.3 mg/dL (ref 8.7–10.3)
Chloride: 98 mmol/L (ref 96–106)
Creatinine, Ser: 1.57 mg/dL — ABNORMAL HIGH (ref 0.57–1.00)
GFR calc Af Amer: 35 mL/min/{1.73_m2} — ABNORMAL LOW (ref >59)
GFR calc non Af Amer: 30 mL/min/{1.73_m2} — ABNORMAL LOW (ref >59)
Glucose: 107 mg/dL — ABNORMAL HIGH (ref 65–99)
Potassium: 3.9 mmol/L (ref 3.5–5.2)
Sodium: 137 mmol/L (ref 134–144)

## 2020-02-14 LAB — CBC
Hematocrit: 35.3 % (ref 34.0–46.6)
Hemoglobin: 11.6 g/dL (ref 11.1–15.9)
MCH: 29.1 pg (ref 26.6–33.0)
MCHC: 32.9 g/dL (ref 31.5–35.7)
MCV: 89 fL (ref 79–97)
Platelets: 73 10*3/uL — CL (ref 150–450)
RBC: 3.98 x10E6/uL (ref 3.77–5.28)
RDW: 13.2 % (ref 11.7–15.4)
WBC: 2.5 10*3/uL — CL (ref 3.4–10.8)

## 2020-02-14 LAB — THYROID PANEL WITH TSH
Free Thyroxine Index: 2.6 (ref 1.2–4.9)
T3 Uptake Ratio: 28 % (ref 24–39)
T4, Total: 9.4 ug/dL (ref 4.5–12.0)
TSH: 2.13 u[IU]/mL (ref 0.450–4.500)

## 2020-02-14 LAB — FERRITIN: Ferritin: 840 ng/mL — ABNORMAL HIGH (ref 15–150)

## 2020-02-17 ENCOUNTER — Other Ambulatory Visit: Payer: Self-pay

## 2020-02-17 ENCOUNTER — Ambulatory Visit (INDEPENDENT_AMBULATORY_CARE_PROVIDER_SITE_OTHER): Payer: PPO | Admitting: Family Medicine

## 2020-02-17 ENCOUNTER — Encounter: Payer: Self-pay | Admitting: Family Medicine

## 2020-02-17 ENCOUNTER — Other Ambulatory Visit: Payer: Self-pay | Admitting: Family Medicine

## 2020-02-17 VITALS — BP 162/58 | HR 58 | Temp 97.8°F | Ht 65.0 in | Wt 115.0 lb

## 2020-02-17 DIAGNOSIS — D696 Thrombocytopenia, unspecified: Secondary | ICD-10-CM | POA: Diagnosis not present

## 2020-02-17 DIAGNOSIS — N1831 Chronic kidney disease, stage 3a: Secondary | ICD-10-CM | POA: Diagnosis not present

## 2020-02-17 DIAGNOSIS — I480 Paroxysmal atrial fibrillation: Secondary | ICD-10-CM | POA: Diagnosis not present

## 2020-02-17 DIAGNOSIS — R7989 Other specified abnormal findings of blood chemistry: Secondary | ICD-10-CM | POA: Diagnosis not present

## 2020-02-17 DIAGNOSIS — I10 Essential (primary) hypertension: Secondary | ICD-10-CM

## 2020-02-17 DIAGNOSIS — E034 Atrophy of thyroid (acquired): Secondary | ICD-10-CM

## 2020-02-17 DIAGNOSIS — M65312 Trigger thumb, left thumb: Secondary | ICD-10-CM | POA: Diagnosis not present

## 2020-02-17 NOTE — Progress Notes (Signed)
Subjective: CC: f/u Hypothyroidism, CKD3 PCP: Janet Norlander, DO [name] is a 86 y.o. female presenting to clinic today for:  1. Hypothyroidism, post surgical Patient compliant with thyroid medication.  No tremor, no difficulty swallowing, no change in voice, no heart palpitations.  Denies any change in bowel habits or weight.    2. HTN w/ CKD3, atrial fibrillation Reports compliance with Tenoretic 50-25, Tambocor, hydralazine and lisinopril.  Again, denies any heart palpitations, shortness of breath, chest pain.  She was wondering based on her last labs if she needs to continue ferrous sulfate twice daily.  Her iron was noted to be elevated.  BPs 120-140/50-60 at home.  She brings log in today.  ROS: Per HPI  Allergies  Allergen Reactions   Amlodipine Other (See Comments)    edema   Isosorbide Nitrate Nausea Only   Sulfonamide Derivatives Nausea Only and Rash   Terazosin Hives and Nausea Only   Past Medical History:  Diagnosis Date   Anemia    Arthritis    Asthma    Atrial fibrillation ([place])    Basal cell carcinoma 02/06/2009   Left ear targus- ([facility])   Basal cell carcinoma 09/28/2008   Right back-([id number])   CKD (chronic kidney disease), stage III    Heart murmur    Hyperlipidemia    Hypertension    Hypothyroidism    Nodule of right lung    Right upper lobe   PONV (postoperative nausea and vomiting)    Renal insufficiency    Chronic   Renal vascular disease    Right renal artery stenosis (HCC) 05/09/2015   Squamous cell carcinoma of skin 09/07/2019   KA-Left shin-txpbx   Vertigo     Current Outpatient Medications:    acetaminophen (TYLENOL) 325 MG tablet, Take 325 mg by mouth daily as needed for moderate pain or headache., Disp: , Rfl:    atenolol-chlorthalidone (TENORETIC) 50-25 MG tablet, TAKE 1 TABLET BY MOUTH EVERY DAY, Disp: 90 tablet, Rfl: 1   atorvastatin (LIPITOR) 20 MG tablet, TAKE 1/2 TABLET BY MOUTH EVERY  DAY, Disp: 45 tablet, Rfl: 1   calcium-vitamin D (OSCAL WITH D) 500-200 MG-UNIT per tablet, Take 2 tablets by mouth at bedtime. , Disp: , Rfl:    cholecalciferol (VITAMIN D) 1000 units tablet, Take 1,000 Units by mouth daily., Disp: , Rfl:    ciclopirox (PENLAC) 8 % solution, APPLY TO ALL TOENAILS DAILY, ONCE WEEKLY CLEANSE NAILS THOROUGHLY WITH NAIL POLISH REMOVER, Disp: , Rfl:    ferrous sulfate 325 (65 FE) MG tablet, Take 325 mg by mouth 2 (two) times daily with a meal., Disp: , Rfl:    flecainide (TAMBOCOR) 50 MG tablet, TAKE 1 TABLET BY MOUTH TWICE A DAY, Disp: 180 tablet, Rfl: 2   fluticasone (FLONASE) 50 MCG/ACT nasal spray, Place 1 spray into both nostrils 2 (two) times daily. , Disp: , Rfl:    hydrALAZINE (APRESOLINE) 100 MG tablet, Take 100 mg by mouth 2 (two) times daily., Disp: , Rfl:    hydroxypropyl methylcellulose / hypromellose (ISOPTO TEARS / GONIOVISC) 2.5 % ophthalmic solution, Place 1 drop into both eyes daily. , Disp: , Rfl:    letrozole (FEMARA) 2.5 MG tablet, Take 1 tablet (2.5 mg total) by mouth daily., Disp: 90 tablet, Rfl: 4   levothyroxine (SYNTHROID) 75 MCG tablet, Take 1 tablet (75 mcg total) by mouth daily., Disp: 30 tablet, Rfl: 0   lisinopril (PRINIVIL,ZESTRIL) 40 MG tablet, Take 1 tablet by mouth daily., Disp: ,  Rfl:    ondansetron (ZOFRAN) 4 MG tablet, Take 1 tablet (4 mg total) by mouth every 8 (eight) hours as needed for nausea or vomiting., Disp: 20 tablet, Rfl: 0 Social History   Socioeconomic History   Marital status: Widowed    Spouse name: Not on file   Number of children: 2   Years of education: 58   Highest education level: Not on file  Occupational History   Occupation: Retired  Tobacco Use   Smoking status: Never Smoker   Smokeless tobacco: Never Used   Tobacco comment: Pt denies cigarettes  Vaping Use   Vaping Use: Never used  Substance and Sexual Activity   Alcohol use: No   Drug use: No   Sexual activity: Never    Other Topics Concern   Not on file  Social History Narrative   Lives alone   Ambidextrous (writes with left hand).   No caffeine use.   Social Determinants of Health   Financial Resource Strain:    Difficulty of Paying Living Expenses:   Food Insecurity:    Worried About Charity fundraiser in the Last Year:    Arboriculturist in the Last Year:   Transportation Needs:    Film/video editor (Medical):    Lack of Transportation (Non-Medical):   Physical Activity:    Days of Exercise per Week:    Minutes of Exercise per Session:   Stress:    Feeling of Stress :   Social Connections:    Frequency of Communication with Friends and Family:    Frequency of Social Gatherings with Friends and Family:    Attends Religious Services:    Active Member of Clubs or Organizations:    Attends Music therapist:    Marital Status:   Intimate Partner Violence:    Fear of Current or Ex-Partner:    Emotionally Abused:    Physically Abused:    Sexually Abused:    Family History  Problem Relation Age of Onset   Thyroid disease Mother    Stroke Father    Hypertension Brother    Lung cancer Maternal Uncle    Cancer Maternal Grandmother        Liver   Lung cancer Maternal Uncle    Hypertension Daughter    Hypercholesterolemia Daughter    Hypercholesterolemia Daughter     Objective: Office vital signs reviewed. BP (!) 162/58    Pulse (!) 58    Temp 97.8 F (36.6 C) (Temporal)    Ht 5\' 5"  (1.651 m)    Wt 115 lb (52.2 kg)    SpO2 97%    BMI 19.14 kg/m   Physical Examination:  General: Awake, alert, well appearing, thin elderly female, No acute distress HEENT: Normal, sclera white, MMM; no exophthalmos.  No goiter Cardio: regular rate and rhythm, S1S2 heard, 2/6 SEM at bilateral sternal borders that radiates to bilateral carotids noted Pulm: clear to auscultation bilaterally, no wheezes, rhonchi or rales; normal work of breathing on room  air Extremities: warm, well perfused, No edema, cyanosis or clubbing; +2 pulses bilaterally MSk: Trigger finger of the left thumb noted.  There is a palpable click.  Assessment/ Plan: 86 y.o. female   1. Paroxysmal atrial fibrillation (HCC) Rate and rhythm controlled.  2. Stage 3a chronic kidney disease Reviewed recent labs with patient.  Ok to hold iron for now.  Will reach out to Renal to FYI  3. Essential hypertension Controlled at home  4.  White coat syndrome with diagnosis of hypertension  5. Thrombocytopenia (HCC) Platelets low.  Continue to follow with Heme recommendations  6. High serum ferritin Hold Iron for how  7. Trigger finger of left thumb Referral to Dr D for trigger thumb injection.    No orders of the defined types were placed in this encounter.  No orders of the defined types were placed in this encounter.    Janet Norlander, DO [place] [phone]

## 2020-02-27 ENCOUNTER — Ambulatory Visit (INDEPENDENT_AMBULATORY_CARE_PROVIDER_SITE_OTHER): Payer: PPO | Admitting: Family Medicine

## 2020-02-27 ENCOUNTER — Other Ambulatory Visit: Payer: Self-pay

## 2020-02-27 ENCOUNTER — Encounter: Payer: Self-pay | Admitting: Family Medicine

## 2020-02-27 VITALS — BP 121/46 | HR 59 | Temp 98.0°F | Ht 65.0 in | Wt 116.0 lb

## 2020-02-27 DIAGNOSIS — M65312 Trigger thumb, left thumb: Secondary | ICD-10-CM

## 2020-02-27 MED ORDER — METHYLPREDNISOLONE ACETATE 80 MG/ML IJ SUSP: 40.0000 mg | Freq: Once | INTRAMUSCULAR | Status: DC

## 2020-02-27 NOTE — Progress Notes (Signed)
BP (!) 121/46   Pulse (!) 59   Temp 98 F (36.7 C)   Ht 5\' 5"  (1.651 m)   Wt 116 lb (52.6 kg)   SpO2 95%   BMI 19.30 kg/m    Subjective:   Patient ID: [name], female    DOB: 02/22/1934, 86 y.o.   MRN: [id number]  HPI: [name] is a 86 y.o. female presenting on 02/27/2020 for trigger finger (left thumb. Injected about 1 month ago with hand specialist in [place].)   HPI Patient is coming in complaining of trigger finger left thumb, she says she has been bothered by it for about a month, she says she had one a couple years ago on her right hand and the injection resolved and she is coming in today see if she can injection on the left hand.  She says it just gets caught but is not really painful and just bothers her but is not anything severe but she does want to get it before it gets severe.  Relevant past medical, surgical, family and social history reviewed and updated as indicated. Interim medical history since our last visit reviewed. Allergies and medications reviewed and updated.  Review of Systems  Constitutional: Negative for chills and fever.  Musculoskeletal: Positive for arthralgias. Negative for back pain and gait problem.  Skin: Negative for rash.  Neurological: Negative for weakness, light-headedness, numbness and headaches.  Psychiatric/Behavioral: Negative for agitation and behavioral problems.  All other systems reviewed and are negative.   Per HPI unless specifically indicated above      Objective:   BP (!) 121/46   Pulse (!) 59   Temp 98 F (36.7 C)   Ht 5\' 5"  (1.651 m)   Wt 116 lb (52.6 kg)   SpO2 95%   BMI 19.30 kg/m   Wt Readings from Last 3 Encounters:  02/27/20 116 lb (52.6 kg)  02/17/20 115 lb (52.2 kg)  10/18/19 115 lb (52.2 kg)    Physical Exam Vitals and nursing note reviewed.  Constitutional:      General: She is not in acute distress.    Appearance: She is well-developed. She is not diaphoretic.  Musculoskeletal:         General: No tenderness. Normal range of motion.     Comments: Trigger finger and left thumb, able to palpate slight nodule among tendon the definitely able to visualize the catching.  Skin:    General: Skin is warm and dry.     Findings: No rash.  Neurological:     Mental Status: She is alert.     Left thumb trigger finger injection: Consent form signed. Risk factors of bleeding and infection discussed with patient and patient is agreeable towards injection. Patient prepped with Betadine. Lateral approach towards injection used. Injected 40 mg of Depo-Medrol and 1 mL of 2% lidocaine. Patient tolerated procedure well and no side effects from noted. Minimal to no bleeding. Simple bandage applied after.   Assessment & Plan:   Problem List Items Addressed This Visit    None    Visit Diagnoses    Trigger finger of left thumb    -  Primary   Relevant Medications   methylPREDNISolone acetate (DEPO-MEDROL) injection 40 mg (Start on 02/27/2020 10:45 AM)       Follow up plan: Return if symptoms worsen or fail to improve.  Counseling provided for all of the vaccine components No orders of the defined types were placed in this encounter.  Caryl Pina, MD [facility] 02/27/2020, 10:37 AM

## 2020-02-28 DIAGNOSIS — H2513 Age-related nuclear cataract, bilateral: Secondary | ICD-10-CM | POA: Diagnosis not present

## 2020-02-28 DIAGNOSIS — H40033 Anatomical narrow angle, bilateral: Secondary | ICD-10-CM | POA: Diagnosis not present

## 2020-02-28 DIAGNOSIS — H43393 Other vitreous opacities, bilateral: Secondary | ICD-10-CM | POA: Diagnosis not present

## 2020-03-01 ENCOUNTER — Telehealth: Payer: Self-pay | Admitting: Family Medicine

## 2020-03-01 NOTE — Telephone Encounter (Signed)
Pt aware of provider feedback and recommendations and voiced understanding.

## 2020-03-01 NOTE — Telephone Encounter (Signed)
I am sorry that it has not helped at all yet, it is still just a few days and, usually they start to take effect about 2 days after, but still not helping at all by the end of next week then we can do a referral to a hand surgeon.  Let us know

## 2020-03-12 ENCOUNTER — Telehealth: Payer: Self-pay | Admitting: Family Medicine

## 2020-03-13 ENCOUNTER — Other Ambulatory Visit: Payer: Self-pay | Admitting: Family Medicine

## 2020-03-13 DIAGNOSIS — E034 Atrophy of thyroid (acquired): Secondary | ICD-10-CM

## 2020-03-19 ENCOUNTER — Other Ambulatory Visit: Payer: Self-pay

## 2020-03-19 DIAGNOSIS — C50511 Malignant neoplasm of lower-outer quadrant of right female breast: Secondary | ICD-10-CM

## 2020-03-19 NOTE — Telephone Encounter (Signed)
Order place and signed hard copy faxed to [facility]

## 2020-03-19 NOTE — Progress Notes (Signed)
mm

## 2020-03-20 DIAGNOSIS — R921 Mammographic calcification found on diagnostic imaging of breast: Secondary | ICD-10-CM | POA: Diagnosis not present

## 2020-03-28 ENCOUNTER — Other Ambulatory Visit: Payer: Self-pay

## 2020-03-28 ENCOUNTER — Inpatient Hospital Stay (HOSPITAL_COMMUNITY): Payer: PPO | Attending: Hematology

## 2020-03-28 DIAGNOSIS — Z79899 Other long term (current) drug therapy: Secondary | ICD-10-CM | POA: Diagnosis not present

## 2020-03-28 DIAGNOSIS — Z8249 Family history of ischemic heart disease and other diseases of the circulatory system: Secondary | ICD-10-CM | POA: Diagnosis not present

## 2020-03-28 DIAGNOSIS — D61818 Other pancytopenia: Secondary | ICD-10-CM | POA: Insufficient documentation

## 2020-03-28 DIAGNOSIS — C50511 Malignant neoplasm of lower-outer quadrant of right female breast: Secondary | ICD-10-CM | POA: Diagnosis not present

## 2020-03-28 DIAGNOSIS — Z800 Family history of malignant neoplasm of digestive organs: Secondary | ICD-10-CM | POA: Diagnosis not present

## 2020-03-28 DIAGNOSIS — I129 Hypertensive chronic kidney disease with stage 1 through stage 4 chronic kidney disease, or unspecified chronic kidney disease: Secondary | ICD-10-CM | POA: Diagnosis not present

## 2020-03-28 DIAGNOSIS — Z823 Family history of stroke: Secondary | ICD-10-CM | POA: Diagnosis not present

## 2020-03-28 DIAGNOSIS — Z8349 Family history of other endocrine, nutritional and metabolic diseases: Secondary | ICD-10-CM | POA: Diagnosis not present

## 2020-03-28 DIAGNOSIS — Z85828 Personal history of other malignant neoplasm of skin: Secondary | ICD-10-CM | POA: Insufficient documentation

## 2020-03-28 DIAGNOSIS — E039 Hypothyroidism, unspecified: Secondary | ICD-10-CM | POA: Diagnosis not present

## 2020-03-28 DIAGNOSIS — M1990 Unspecified osteoarthritis, unspecified site: Secondary | ICD-10-CM | POA: Insufficient documentation

## 2020-03-28 DIAGNOSIS — Z801 Family history of malignant neoplasm of trachea, bronchus and lung: Secondary | ICD-10-CM | POA: Diagnosis not present

## 2020-03-28 DIAGNOSIS — N183 Chronic kidney disease, stage 3 unspecified: Secondary | ICD-10-CM | POA: Diagnosis not present

## 2020-03-28 DIAGNOSIS — Z170 Estrogen receptor positive status [ER+]: Secondary | ICD-10-CM | POA: Diagnosis not present

## 2020-03-28 DIAGNOSIS — Z888 Allergy status to other drugs, medicaments and biological substances status: Secondary | ICD-10-CM | POA: Diagnosis not present

## 2020-03-28 DIAGNOSIS — I480 Paroxysmal atrial fibrillation: Secondary | ICD-10-CM | POA: Diagnosis not present

## 2020-03-28 DIAGNOSIS — Z882 Allergy status to sulfonamides status: Secondary | ICD-10-CM | POA: Insufficient documentation

## 2020-03-28 LAB — COMPREHENSIVE METABOLIC PANEL
ALT: 15 U/L (ref 0–44)
AST: 21 U/L (ref 15–41)
Albumin: 4.1 g/dL (ref 3.5–5.0)
Alkaline Phosphatase: 55 U/L (ref 38–126)
Anion gap: 11 (ref 5–15)
BUN: 30 mg/dL — ABNORMAL HIGH (ref 8–23)
CO2: 29 mmol/L (ref 22–32)
Calcium: 9.2 mg/dL (ref 8.9–10.3)
Chloride: 96 mmol/L — ABNORMAL LOW (ref 98–111)
Creatinine, Ser: 1.62 mg/dL — ABNORMAL HIGH (ref 0.44–1.00)
GFR calc Af Amer: 33 mL/min — ABNORMAL LOW (ref >60)
GFR calc non Af Amer: 29 mL/min — ABNORMAL LOW (ref >60)
Glucose, Bld: 166 mg/dL — ABNORMAL HIGH (ref 70–99)
Potassium: 3.7 mmol/L (ref 3.5–5.1)
Sodium: 136 mmol/L (ref 135–145)
Total Bilirubin: 0.7 mg/dL (ref 0.3–1.2)
Total Protein: 7.2 g/dL (ref 6.5–8.1)

## 2020-03-28 LAB — CBC WITH DIFFERENTIAL/PLATELET
Abs Immature Granulocytes: 0 10*3/uL (ref 0.00–0.07)
Basophils Absolute: 0 10*3/uL (ref 0.0–0.1)
Basophils Relative: 1 %
Eosinophils Absolute: 0 10*3/uL (ref 0.0–0.5)
Eosinophils Relative: 1 %
HCT: 35.5 % — ABNORMAL LOW (ref 36.0–46.0)
Hemoglobin: 11.6 g/dL — ABNORMAL LOW (ref 12.0–15.0)
Immature Granulocytes: 0 %
Lymphocytes Relative: 29 %
Lymphs Abs: 0.6 10*3/uL — ABNORMAL LOW (ref 0.7–4.0)
MCH: 29.6 pg (ref 26.0–34.0)
MCHC: 32.7 g/dL (ref 30.0–36.0)
MCV: 90.6 fL (ref 80.0–100.0)
Monocytes Absolute: 0.2 10*3/uL (ref 0.1–1.0)
Monocytes Relative: 9 %
Neutro Abs: 1.4 10*3/uL — ABNORMAL LOW (ref 1.7–7.7)
Neutrophils Relative %: 60 %
Platelets: 107 10*3/uL — ABNORMAL LOW (ref 150–400)
RBC: 3.92 MIL/uL (ref 3.87–5.11)
RDW: 13.7 % (ref 11.5–15.5)
WBC: 2.2 10*3/uL — ABNORMAL LOW (ref 4.0–10.5)
nRBC: 0 % (ref 0.0–0.2)

## 2020-03-28 LAB — IRON AND TIBC
Iron: 56 ug/dL (ref 28–170)
Saturation Ratios: 21 % (ref 10.4–31.8)
TIBC: 264 ug/dL (ref 250–450)
UIBC: 208 ug/dL

## 2020-03-28 LAB — LACTATE DEHYDROGENASE: LDH: 134 U/L (ref 98–192)

## 2020-03-28 LAB — FERRITIN: Ferritin: 528 ng/mL — ABNORMAL HIGH (ref 11–307)

## 2020-03-28 LAB — VITAMIN D 25 HYDROXY (VIT D DEFICIENCY, FRACTURES): Vit D, 25-Hydroxy: 56.84 ng/mL (ref 30–100)

## 2020-03-28 LAB — VITAMIN B12: Vitamin B-12: 301 pg/mL (ref 180–914)

## 2020-04-01 ENCOUNTER — Other Ambulatory Visit: Payer: Self-pay | Admitting: Family Medicine

## 2020-04-04 ENCOUNTER — Other Ambulatory Visit: Payer: Self-pay

## 2020-04-04 ENCOUNTER — Inpatient Hospital Stay (HOSPITAL_BASED_OUTPATIENT_CLINIC_OR_DEPARTMENT_OTHER): Payer: PPO | Admitting: Nurse Practitioner

## 2020-04-04 DIAGNOSIS — C50511 Malignant neoplasm of lower-outer quadrant of right female breast: Secondary | ICD-10-CM | POA: Diagnosis not present

## 2020-04-04 DIAGNOSIS — Z170 Estrogen receptor positive status [ER+]: Secondary | ICD-10-CM | POA: Diagnosis not present

## 2020-04-04 NOTE — Assessment & Plan Note (Signed)
1.  Stage I right breast cancer: -She had a mammogram on 02/16/2018 which showed B RADS category 0 incomplete.  She was referred for a biopsy. - Right breast biopsy on 02/22/2018 consistent with IDC. -Right breast lumpectomy on 04/08/2018, IDC, 1.1 cm, grade 1, margins negative, ER/PR positive, HER-2 negative, Ki-67 1% -Letrozole started on 05/28/2018.  She is tolerating this well. -She declined adjuvant RT. -Her mammogram on 03/20/2020 was BI-RADS Category 2 benign.  She has her mammograms done with [facility].  They will fax [place] a copy and it will be placed into media -Labs done on 03/28/2020 showed her WBC 2.2, hemoglobin 11.6, platelets 107 - She will follow-up in 6 months with repeat labs.  2.  Pancytopenia: -Mild thrombocytopenia since 2015.  Leukopenia since 2014. She also has normocytic anemia. - Etiology could be from MDS as well as CKD. - Labs on 03/28/2020 showed her potassium 3.7, creatinine 1.60, WBC 2.2, hemoglobin 11.6, platelets 1 7 -We will recheck labs in 6 months.  3.  Health maintenance: -DEXA scan done 03/07/2019 showed a T score of -1.0 -Plan is to repeat in 2 years. -She will continue calcium and vitamin D daily.

## 2020-04-04 NOTE — Progress Notes (Signed)
[place], [place] [id number]   CLINIC:  Medical Oncology/Hematology  PCP:  [name], DO [place] [address] [phone]   REASON FOR VISIT: Follow-up for breast cancer   CURRENT THERAPY: Letrozole.  BRIEF ONCOLOGIC HISTORY:  Oncology History Overview Note  Cancer Staging Malignant neoplasm of lower-outer quadrant of right breast of female, estrogen receptor positive ([place]) Staging form: Breast, AJCC 8th Edition - Clinical stage from 02/22/2018: Stage IA (cT1c, cN0, cM0, G1, ER+, PR+, HER2-) - Signed by Truitt Merle, MD on 03/02/2018    Malignant neoplasm of lower-outer quadrant of right breast of female, estrogen receptor positive ([place])  02/12/2018 Mammogram   She had routine screening bilateral mammography on 02/12/2018 at [facility] with results showing: indeterminate irregular mass in the right breast.    02/16/2018 Mammogram   She underwent right diagnostic mammography with tomography and right breast ultrasonography at [facility] on 02/16/2018 showing: 1.1 cm irregular mass at the 8 O'clock position on the right breast   02/22/2018 Pathology Results   Diagnosis 1. Breast, right, needle core biopsy, lateral, 8 o'clock - INVASIVE AND IN SITU DUCTAL CARCINOMA. - SEE COMMENT. 2. Breast, right, needle core biopsy, 11 o'clock - FIBROCYSTIC CHANGES. - USUAL DUCTAL HYPERPLASIA. - THERE IS NO EVIDENCE OF MALIGNANCY.   02/22/2018 Receptors her2   IMMUNOHISTOCHEMICAL AND MORPHOMETRIC ANALYSIS PERFORMED MANUALLY Estrogen Receptor: 100%, POSITIVE, STRONG STAINING INTENSITY Progesterone Receptor: 40%, POSITIVE, MODERATE STAINING INTENSITY Proliferation Marker Ki67: 1% Her2 Negative   02/22/2018 Cancer Staging   Staging form: Breast, AJCC 8th Edition - Clinical stage from 02/22/2018: Stage IA (cT1c, cN0, cM0, G1, ER+, PR+, HER2-) - Signed by Truitt Merle, MD on 03/02/2018    02/25/2018 Initial Diagnosis   Malignant neoplasm of lower-outer  quadrant of right breast of female, estrogen receptor positive ([place])     CANCER STAGING: Cancer Staging Malignant neoplasm of lower-outer quadrant of right breast of female, estrogen receptor positive ([place]) Staging form: Breast, AJCC 8th Edition - Clinical stage from 02/22/2018: Stage IA (cT1c, cN0, cM0, G1, ER+, PR+, HER2-) - Signed by Truitt Merle, MD on 03/02/2018    INTERVAL HISTORY:  Ms. Sherpa 86 y.o. female returns for routine follow-up for breast cancer.  Patient reports she is doing well since her last visit.  She denies any new lumps or bumps present.  She denies any new bone pain. Denies any nausea, vomiting, or diarrhea. Denies any new pains. Had not noticed any recent bleeding such as epistaxis, hematuria or hematochezia. Denies recent chest pain on exertion, shortness of breath on minimal exertion, pre-syncopal episodes, or palpitations. Denies any numbness or tingling in hands or feet. Denies any recent fevers, infections, or recent hospitalizations. Patient reports appetite at 100% and energy level at 100%.  She is eating well maintaining her weight this time.     REVIEW OF SYSTEMS:  Review of Systems  Gastrointestinal: Positive for constipation.  All other systems reviewed and are negative.    PAST MEDICAL/SURGICAL HISTORY:  Past Medical History:  Diagnosis Date  . Anemia   . Arthritis   . Asthma   . Atrial fibrillation ([place])   . Basal cell carcinoma 02/06/2009   Left ear targus- ([facility])  . Basal cell carcinoma 09/28/2008   Right back-(CX35FU)  . CKD (chronic kidney disease), stage III   . Heart murmur   . Hyperlipidemia   . Hypertension   . Hypothyroidism   . Nodule of right lung  Right upper lobe  . PONV (postoperative nausea and vomiting)   . Renal insufficiency    Chronic  . Renal vascular disease   . Right renal artery stenosis ([place]) 05/09/2015  . Squamous cell carcinoma of skin 09/07/2019   KA-Left shin-txpbx  . Vertigo    Past Surgical History:   Procedure Laterality Date  . BREAST LUMPECTOMY WITH RADIOACTIVE SEED LOCALIZATION Right 04/08/2018   Procedure: BREAST LUMPECTOMY WITH RADIOACTIVE SEED LOCALIZATION;  Surgeon: Rolm Bookbinder, MD;  Location: [place] [facility];  Service: General;  Laterality: Right;  . IR GENERIC HISTORICAL  08/29/2016   IR US GUIDE VASC ACCESS RIGHT 08/29/2016 [facility]  . IR GENERIC HISTORICAL  08/29/2016   IR RENAL BILAT S&I MOD SED 08/29/2016 [facility]  . KNEE ARTHROSCOPY     2019  . THYROIDECTOMY, PARTIAL Right 1981   middle lobe removed 1st, then right lobe removed 7-8 years later (approx. 1988)     SOCIAL HISTORY:  Social History   Socioeconomic History  . Marital status: Widowed    Spouse name: Not on file  . Number of children: 2  . Years of education: 32  . Highest education level: Not on file  Occupational History  . Occupation: Retired  Tobacco Use  . Smoking status: Never Smoker  . Smokeless tobacco: Never Used  . Tobacco comment: Pt denies cigarettes  Vaping Use  . Vaping Use: Never used  Substance and Sexual Activity  . Alcohol use: No  . Drug use: No  . Sexual activity: Never  Other Topics Concern  . Not on file  Social History Narrative   Lives alone   Ambidextrous (writes with left hand).   No caffeine use.   Social Determinants of Health   Financial Resource Strain:   . Difficulty of Paying Living Expenses:   Food Insecurity:   . Worried About Charity fundraiser in the Last Year:   . Arboriculturist in the Last Year:   Transportation Needs:   . Film/video editor (Medical):   Marland Kitchen Lack of Transportation (Non-Medical):   Physical Activity:   . Days of Exercise per Week:   . Minutes of Exercise per Session:   Stress:   . Feeling of Stress :   Social Connections:   . Frequency of Communication with Friends and Family:   . Frequency of Social Gatherings with Friends and Family:   . Attends Religious Services:   . Active Member of Clubs or Organizations:   .  Attends Archivist Meetings:   Marland Kitchen Marital Status:   Intimate Partner Violence:   . Fear of Current or Ex-Partner:   . Emotionally Abused:   Marland Kitchen Physically Abused:   . Sexually Abused:     FAMILY HISTORY:  Family History  Problem Relation Age of Onset  . Thyroid disease Mother   . Stroke Father   . Hypertension Brother   . Lung cancer Maternal Uncle   . Cancer Maternal Grandmother        Liver  . Lung cancer Maternal Uncle   . Hypertension Daughter   . Hypercholesterolemia Daughter   . Hypercholesterolemia Daughter     CURRENT MEDICATIONS:  Outpatient Encounter Medications as of 04/04/2020  Medication Sig Note  . acetaminophen (TYLENOL) 325 MG tablet Take 325 mg by mouth daily as needed for moderate pain or headache.   Marland Kitchen atenolol-chlorthalidone (TENORETIC) 50-25 MG tablet TAKE 1 TABLET BY MOUTH EVERY DAY   . atorvastatin (LIPITOR) 20 MG  tablet TAKE 1/2 TABLET BY MOUTH EVERY DAY   . calcium-vitamin D (OSCAL WITH D) 500-200 MG-UNIT per tablet Take 2 tablets by mouth at bedtime.    . cholecalciferol (VITAMIN D) 1000 units tablet Take 1,000 Units by mouth daily.   . ferrous sulfate 325 (65 FE) MG tablet Take 325 mg by mouth 2 (two) times daily with a meal.   . flecainide (TAMBOCOR) 50 MG tablet TAKE 1 TABLET BY MOUTH TWICE A DAY   . fluticasone (FLONASE) 50 MCG/ACT nasal spray Place 1 spray into both nostrils 2 (two) times daily.    . hydrALAZINE (APRESOLINE) 100 MG tablet Take 100 mg by mouth 2 (two) times daily.   . hydroxypropyl methylcellulose / hypromellose (ISOPTO TEARS / GONIOVISC) 2.5 % ophthalmic solution Place 1 drop into both eyes daily.    Marland Kitchen letrozole (FEMARA) 2.5 MG tablet Take 1 tablet (2.5 mg total) by mouth daily.   Marland Kitchen levothyroxine (SYNTHROID) 75 MCG tablet TAKE 1 TABLET BY MOUTH EVERY DAY   . lisinopril (PRINIVIL,ZESTRIL) 40 MG tablet Take 1 tablet by mouth daily. 02/08/2019: Blood  pressure was low.  . ondansetron (ZOFRAN) 4 MG tablet Take 1 tablet (4 mg  total) by mouth every 8 (eight) hours as needed for nausea or vomiting. (Patient not taking: Reported on 04/04/2020)    Facility-Administered Encounter Medications as of 04/04/2020  Medication  . methylPREDNISolone acetate (DEPO-MEDROL) injection 40 mg    ALLERGIES:  Allergies  Allergen Reactions  . Amlodipine Other (See Comments)    edema  . Isosorbide Nitrate Nausea Only  . Sulfonamide Derivatives Nausea Only and Rash  . Terazosin Hives and Nausea Only     PHYSICAL EXAM:  ECOG Performance status: 1  Vitals:   04/04/20 1109  BP: (!) 158/64  Pulse: (!) 51  Resp: 17  Temp: 97.7 F (36.5 C)  SpO2: 97%   Filed Weights   04/04/20 1109  Weight: 116 lb 12.8 oz (53 kg)   Physical Exam Constitutional:      Appearance: Normal appearance. She is normal weight.  Cardiovascular:     Rate and Rhythm: Normal rate and regular rhythm.     Heart sounds: Normal heart sounds.  Pulmonary:     Effort: Pulmonary effort is normal.     Breath sounds: Normal breath sounds.  Abdominal:     General: Bowel sounds are normal.     Palpations: Abdomen is soft.  Musculoskeletal:        General: Normal range of motion.  Skin:    General: Skin is warm.  Neurological:     Mental Status: She is alert and oriented to person, place, and time. Mental status is at baseline.  Psychiatric:        Mood and Affect: Mood normal.        Behavior: Behavior normal.        Thought Content: Thought content normal.        Judgment: Judgment normal.      LABORATORY DATA:  I have reviewed the labs as listed.  CBC    Component Value Date/Time   WBC 2.2 (L) 03/28/2020 1058   RBC 3.92 03/28/2020 1058   HGB 11.6 (L) 03/28/2020 1058   HGB 11.6 02/13/2020 0846   HCT 35.5 (L) 03/28/2020 1058   HCT 35.3 02/13/2020 0846   PLT 107 (L) 03/28/2020 1058   PLT 73 (LL) 02/13/2020 0846   MCV 90.6 03/28/2020 1058   MCV 89 02/13/2020 0846   MCH 29.6  03/28/2020 1058   MCHC 32.7 03/28/2020 1058   RDW 13.7  03/28/2020 1058   RDW 13.2 02/13/2020 0846   LYMPHSABS 0.6 (L) 03/28/2020 1058   LYMPHSABS 0.8 10/12/2019 0000   MONOABS 0.2 03/28/2020 1058   EOSABS 0.0 03/28/2020 1058   EOSABS 0.0 10/12/2019 0000   BASOSABS 0.0 03/28/2020 1058   BASOSABS 0.0 10/12/2019 0000   CMP Latest Ref Rng & Units 03/28/2020 02/13/2020 10/11/2019  Glucose 70 - 99 mg/dL 166(H) 107(H) 94  BUN 8 - 23 mg/dL 30(H) 31(H) 23  Creatinine 0.44 - 1.00 mg/dL 1.62(H) 1.57(H) 1.59(H)  Sodium 135 - 145 mmol/L 136 137 137  Potassium 3.5 - 5.1 mmol/L 3.7 3.9 3.9  Chloride 98 - 111 mmol/L 96(L) 98 95(L)  CO2 22 - 32 mmol/L '29 26 27  ' Calcium 8.9 - 10.3 mg/dL 9.2 9.3 9.8  Total Protein 6.5 - 8.1 g/dL 7.2 - 6.9  Total Bilirubin 0.3 - 1.2 mg/dL 0.7 - 0.6  Alkaline Phos 38 - 126 U/L 55 - 70  AST 15 - 41 U/L 21 - 19  ALT 0 - 44 U/L 15 - 10    All questions were answered to patient's stated satisfaction. Encouraged patient to call with any new concerns or questions before his next visit to the cancer center and we can certain see him sooner, if needed.     ASSESSMENT & PLAN:  Malignant neoplasm of lower-outer quadrant of right breast of female, estrogen receptor positive ([place]) 1.  Stage I right breast cancer: -She had a mammogram on 02/16/2018 which showed B RADS category 0 incomplete.  She was referred for a biopsy. - Right breast biopsy on 02/22/2018 consistent with IDC. -Right breast lumpectomy on 04/08/2018, IDC, 1.1 cm, grade 1, margins negative, ER/PR positive, HER-2 negative, Ki-67 1% -Letrozole started on 05/28/2018.  She is tolerating this well. -She declined adjuvant RT. -Her mammogram on 03/20/2020 was BI-RADS Category 2 benign.  She has her mammograms done with [facility].  They will fax [place] a copy and it will be placed into media -Labs done on 03/28/2020 showed her WBC 2.2, hemoglobin 11.6, platelets 107 - She will follow-up in 6 months with repeat labs.  2.  Pancytopenia: -Mild thrombocytopenia since 2015.  Leukopenia since  2014. She also has normocytic anemia. - Etiology could be from MDS as well as CKD. - Labs on 03/28/2020 showed her potassium 3.7, creatinine 1.60, WBC 2.2, hemoglobin 11.6, platelets 1 7 -We will recheck labs in 6 months.  3.  Health maintenance: -DEXA scan done 03/07/2019 showed a T score of -1.0 -Plan is to repeat in 2 years. -She will continue calcium and vitamin D daily.     Orders placed this encounter:  Orders Placed This Encounter  Procedures  . CBC with Differential/Platelet  . Comprehensive metabolic panel  . Ferritin  . Iron and TIBC  . Lactate dehydrogenase  . Vitamin B12  . VITAMIN D 25 Hydroxy (Vit-D Deficiency, Fractures)      Francene Finders, FNP-C [place] [phone]

## 2020-04-10 ENCOUNTER — Other Ambulatory Visit: Payer: Self-pay | Admitting: Family Medicine

## 2020-04-10 DIAGNOSIS — I10 Essential (primary) hypertension: Secondary | ICD-10-CM

## 2020-04-11 ENCOUNTER — Other Ambulatory Visit: Payer: Self-pay | Admitting: Family Medicine

## 2020-04-11 ENCOUNTER — Telehealth: Payer: Self-pay | Admitting: Family Medicine

## 2020-04-11 MED ORDER — POLYSACCHARIDE IRON COMPLEX 150 MG PO CAPS: 150.0000 mg | ORAL_CAPSULE | Freq: Every day | ORAL | 12 refills | Status: DC

## 2020-04-11 NOTE — Telephone Encounter (Signed)
°  Prescription Request  04/11/2020  What is the name of the medication or equipment? ferrocite 100mg . Pt takes this OTC but out of stock. PT was told my pharmacist to call pcp and try for a rx.   Have you contacted your pharmacy to request a refill? (if applicable) no  Which pharmacy would you like this sent to? [facility]   Patient notified that their request is being sent to the clinical staff for review and that they should receive a response within 2 business days.

## 2020-04-11 NOTE — Telephone Encounter (Signed)
Ferrocite does not come up in Epic.  Niferex sent If they don't have that. Ok to give verbal for whatever they have

## 2020-04-19 ENCOUNTER — Telehealth (HOSPITAL_COMMUNITY): Payer: Self-pay | Admitting: *Deleted

## 2020-04-19 NOTE — Telephone Encounter (Signed)
Pt called into clinic stating the pharmacy doesn't have the medication she normally states called ferrous sulfate 325 mg twice daily. Pt's PCP called her in a new prescription of  iron polysaccharides 150 mg daily. Pt wanted to make sure it was okay to take the new medication in place of the old medication. I spoke with Francene Finders, NP and she stated it was okay to take the iron polysaccharides 150 mg daily in place of the ferrous sulfate 325 mg twice a daily. I called pt to let her know and she verbalized understanding.

## 2020-04-27 DIAGNOSIS — N183 Chronic kidney disease, stage 3 unspecified: Secondary | ICD-10-CM | POA: Diagnosis not present

## 2020-04-27 DIAGNOSIS — I129 Hypertensive chronic kidney disease with stage 1 through stage 4 chronic kidney disease, or unspecified chronic kidney disease: Secondary | ICD-10-CM | POA: Diagnosis not present

## 2020-04-27 DIAGNOSIS — N2889 Other specified disorders of kidney and ureter: Secondary | ICD-10-CM | POA: Diagnosis not present

## 2020-04-27 DIAGNOSIS — D649 Anemia, unspecified: Secondary | ICD-10-CM | POA: Diagnosis not present

## 2020-05-04 ENCOUNTER — Ambulatory Visit (INDEPENDENT_AMBULATORY_CARE_PROVIDER_SITE_OTHER): Payer: PPO | Admitting: Family Medicine

## 2020-05-04 ENCOUNTER — Other Ambulatory Visit: Payer: Self-pay

## 2020-05-04 ENCOUNTER — Encounter: Payer: Self-pay | Admitting: Family Medicine

## 2020-05-04 VITALS — BP 183/61 | HR 56 | Temp 97.9°F | Resp 20 | Ht 65.0 in | Wt 115.0 lb

## 2020-05-04 DIAGNOSIS — I10 Essential (primary) hypertension: Secondary | ICD-10-CM

## 2020-05-04 DIAGNOSIS — H6011 Cellulitis of right external ear: Secondary | ICD-10-CM

## 2020-05-04 DIAGNOSIS — M25471 Effusion, right ankle: Secondary | ICD-10-CM

## 2020-05-04 MED ORDER — DOXYCYCLINE HYCLATE 100 MG PO TABS: 100.0000 mg | ORAL_TABLET | Freq: Two times a day (BID) | ORAL | 0 refills | Status: DC

## 2020-05-04 NOTE — Progress Notes (Signed)
Subjective: CC: right ear pain, right foot swelling PCP: Janet Norlander, DO  [name] is a 86 y.o. female presenting to clinic today for:  1. Right ear pain [name] report pain in her right ear for 1 week. The pain is located in upper outer part of her ear. The area is also red, swollen, painful to the touch. She is unable to sleep on her right side because of the pain. She has tried Neosporin and benadryl, but these have not helped. She denies recent swimming. Denies fever, drainage or URI symptoms.    2. Swelling of right foot She reports mild swelling of her right ankle since yesterday with some mild soreness. She denies injury or trauma. She has used muscle rub with some relief. She has not tried tylenol and does not take antiinflammatories because of her kidneys. She has not tried ice, heat, or elevation. She denies fever, warmth or tenderness.   3. Hypertension She checks her BP almost daily at home and reports that it is typically 120-130s/60s. She reports that her BP is always high in the office but not at home. She denies dizziness, chest pain, or shortness of breath.   Relevant past medical, surgical, family, and social history reviewed and updated as indicated.  Allergies and medications reviewed and updated.  Allergies  Allergen Reactions  . Amlodipine Other (See Comments)    edema  . Isosorbide Nitrate Nausea Only  . Sulfonamide Derivatives Nausea Only and Rash  . Terazosin Hives and Nausea Only   Past Medical History:  Diagnosis Date  . Anemia   . Arthritis   . Asthma   . Atrial fibrillation ([place])   . Basal cell carcinoma 02/06/2009   Left ear targus- ([facility])  . Basal cell carcinoma 09/28/2008   Right back-(CX35FU)  . CKD (chronic kidney disease), stage III   . Heart murmur   . Hyperlipidemia   . Hypertension   . Hypothyroidism   . Nodule of right lung    Right upper lobe  . PONV (postoperative nausea and vomiting)   . Renal  insufficiency    Chronic  . Renal vascular disease   . Right renal artery stenosis ([place]) 05/09/2015  . Squamous cell carcinoma of skin 09/07/2019   [facility]-Left shin-txpbx  . Vertigo     Current Outpatient Medications:  .  acetaminophen (TYLENOL) 325 MG tablet, Take 325 mg by mouth daily as needed for moderate pain or headache., Disp: , Rfl:  .  atenolol-chlorthalidone (TENORETIC) 50-25 MG tablet, TAKE 1 TABLET BY MOUTH EVERY DAY, Disp: 90 tablet, Rfl: 1 .  atorvastatin (LIPITOR) 20 MG tablet, TAKE 1/2 TABLET BY MOUTH EVERY DAY, Disp: 45 tablet, Rfl: 0 .  calcium-vitamin D (OSCAL WITH D) 500-200 MG-UNIT per tablet, Take 2 tablets by mouth at bedtime. , Disp: , Rfl:  .  cholecalciferol (VITAMIN D) 1000 units tablet, Take 1,000 Units by mouth daily., Disp: , Rfl:  .  ferrous sulfate 325 (65 FE) MG tablet, Take 325 mg by mouth 2 (two) times daily with a meal., Disp: , Rfl:  .  flecainide (TAMBOCOR) 50 MG tablet, TAKE 1 TABLET BY MOUTH TWICE A DAY, Disp: 180 tablet, Rfl: 2 .  fluticasone (FLONASE) 50 MCG/ACT nasal spray, Place 1 spray into both nostrils 2 (two) times daily. , Disp: , Rfl:  .  hydrALAZINE (APRESOLINE) 100 MG tablet, Take 100 mg by mouth 2 (two) times daily., Disp: , Rfl:  .  hydroxypropyl methylcellulose / hypromellose (  ISOPTO TEARS / GONIOVISC) 2.5 % ophthalmic solution, Place 1 drop into both eyes daily. , Disp: , Rfl:  .  iron polysaccharides (NIFEREX) 150 MG capsule, Take 1 capsule (150 mg total) by mouth daily., Disp: 30 capsule, Rfl: 12 .  letrozole (FEMARA) 2.5 MG tablet, Take 1 tablet (2.5 mg total) by mouth daily., Disp: 90 tablet, Rfl: 4 .  levothyroxine (SYNTHROID) 75 MCG tablet, TAKE 1 TABLET BY MOUTH EVERY DAY, Disp: 90 tablet, Rfl: 3 .  lisinopril (PRINIVIL,ZESTRIL) 40 MG tablet, Take 1 tablet by mouth daily., Disp: , Rfl:  .  ondansetron (ZOFRAN) 4 MG tablet, Take 1 tablet (4 mg total) by mouth every 8 (eight) hours as needed for nausea or vomiting., Disp: 20 tablet, Rfl:  0  Current Facility-Administered Medications:  .  methylPREDNISolone acetate (DEPO-MEDROL) injection 40 mg, 40 mg, Intramuscular, Once, Dettinger, Fransisca Kaufmann, MD Social History   Socioeconomic History  . Marital status: Widowed    Spouse name: Not on file  . Number of children: 2  . Years of education: 52  . Highest education level: Not on file  Occupational History  . Occupation: Retired  Tobacco Use  . Smoking status: Never Smoker  . Smokeless tobacco: Never Used  . Tobacco comment: Pt denies cigarettes  Vaping Use  . Vaping Use: Never used  Substance and Sexual Activity  . Alcohol use: No  . Drug use: No  . Sexual activity: Never  Other Topics Concern  . Not on file  Social History Narrative   Lives alone   Ambidextrous (writes with left hand).   No caffeine use.   Social Determinants of Health   Financial Resource Strain:   . Difficulty of Paying Living Expenses: Not on file  Food Insecurity:   . Worried About Charity fundraiser in the Last Year: Not on file  . Ran Out of Food in the Last Year: Not on file  Transportation Needs:   . Lack of Transportation (Medical): Not on file  . Lack of Transportation (Non-Medical): Not on file  Physical Activity:   . Days of Exercise per Week: Not on file  . Minutes of Exercise per Session: Not on file  Stress:   . Feeling of Stress : Not on file  Social Connections:   . Frequency of Communication with Friends and Family: Not on file  . Frequency of Social Gatherings with Friends and Family: Not on file  . Attends Religious Services: Not on file  . Active Member of Clubs or Organizations: Not on file  . Attends Archivist Meetings: Not on file  . Marital Status: Not on file  Intimate Partner Violence:   . Fear of Current or Ex-Partner: Not on file  . Emotionally Abused: Not on file  . Physically Abused: Not on file  . Sexually Abused: Not on file   Family History  Problem Relation Age of Onset  . Thyroid  disease Mother   . Stroke Father   . Hypertension Brother   . Lung cancer Maternal Uncle   . Cancer Maternal Grandmother        Liver  . Lung cancer Maternal Uncle   . Hypertension Daughter   . Hypercholesterolemia Daughter   . Hypercholesterolemia Daughter     Review of Systems  Constitutional: Negative for chills, diaphoresis, fatigue, fever and unexpected weight change.  HENT: Positive for ear pain. Negative for congestion, ear discharge, hearing loss and sore throat.   Eyes: Negative for visual disturbance.  Respiratory: Negative for cough, chest tightness and shortness of breath.   Cardiovascular: Negative for chest pain and palpitations.  Musculoskeletal: Positive for joint swelling (right ankle).  Neurological: Negative for dizziness, facial asymmetry, speech difficulty and weakness.     Objective: Office vital signs reviewed. BP (!) 183/61   Pulse (!) 56   Temp 97.9 F (36.6 C) (Temporal)   Resp 20   Ht 5\' 5"  (1.651 m)   Wt 115 lb (52.2 kg)   SpO2 95%   BMI 19.14 kg/m    BP Readings from Last 3 Encounters:  05/04/20 (!) 183/61  04/04/20 (!) 158/64  02/27/20 (!) 121/46     Physical Examination:  Physical Exam Vitals and nursing note reviewed.  Constitutional:      General: She is not in acute distress.    Appearance: Normal appearance. She is not ill-appearing or diaphoretic.  HENT:     Head: Normocephalic and atraumatic.     Right Ear: Tympanic membrane and ear canal normal.     Left Ear: Tympanic membrane, ear canal and external ear normal.     Ears:     Comments: Right superior and posterior helix is erythematous, warm, and edematous with tenderness to palpation. Right ear lobe is normal. No tenderness, edema, or erythema noted at mastoid process.  Cardiovascular:     Rate and Rhythm: Normal rate and regular rhythm.     Pulses: Normal pulses.     Heart sounds: Murmur heard.   Pulmonary:     Effort: Pulmonary effort is normal.     Breath sounds:  Normal breath sounds.  Musculoskeletal:     Cervical back: Neck supple.     Comments: 1+ non pitting edema to right ankle. No erythema or warmth noted. No tenderness to palpation. Full ROM without pain to joint. No tenderness or warmth of right calf. Pedal pulses 2+ with brisk capillary refill.   Skin:    General: Skin is warm and dry.     Capillary Refill: Capillary refill takes less than 2 seconds.  Neurological:     General: No focal deficit present.     Mental Status: She is oriented to person, place, and time.  Psychiatric:        Mood and Affect: Mood normal.        Behavior: Behavior normal.      Results for orders placed or performed in visit on 03/28/20  Lactate dehydrogenase  Result Value Ref Range   LDH 134 98 - 192 U/L  CBC with Differential/Platelet  Result Value Ref Range   WBC 2.2 (L) 4.0 - 10.5 K/uL   RBC 3.92 3.87 - 5.11 MIL/uL   Hemoglobin 11.6 (L) 12.0 - 15.0 g/dL   HCT 35.5 (L) 36 - 46 %   MCV 90.6 80.0 - 100.0 fL   MCH 29.6 26.0 - 34.0 pg   MCHC 32.7 30.0 - 36.0 g/dL   RDW 13.7 11.5 - 15.5 %   Platelets 107 (L) 150 - 400 K/uL   nRBC 0.0 0.0 - 0.2 %   Neutrophils Relative % 60 %   Neutro Abs 1.4 (L) 1.7 - 7.7 K/uL   Lymphocytes Relative 29 %   Lymphs Abs 0.6 (L) 0.7 - 4.0 K/uL   Monocytes Relative 9 %   Monocytes Absolute 0.2 0 - 1 K/uL   Eosinophils Relative 1 %   Eosinophils Absolute 0.0 0 - 0 K/uL   Basophils Relative 1 %   Basophils Absolute 0.0 0 -  0 K/uL   Immature Granulocytes 0 %   Abs Immature Granulocytes 0.00 0.00 - 0.07 K/uL  Comprehensive metabolic panel  Result Value Ref Range   Sodium 136 135 - 145 mmol/L   Potassium 3.7 3.5 - 5.1 mmol/L   Chloride 96 (L) 98 - 111 mmol/L   CO2 29 22 - 32 mmol/L   Glucose, Bld 166 (H) 70 - 99 mg/dL   BUN 30 (H) 8 - 23 mg/dL   Creatinine, Ser 1.62 (H) 0.44 - 1.00 mg/dL   Calcium 9.2 8.9 - 10.3 mg/dL   Total Protein 7.2 6.5 - 8.1 g/dL   Albumin 4.1 3.5 - 5.0 g/dL   AST 21 15 - 41 U/L   ALT 15 0  - 44 U/L   Alkaline Phosphatase 55 38 - 126 U/L   Total Bilirubin 0.7 0.3 - 1.2 mg/dL   GFR calc non Af Amer 29 (L) >60 mL/min   GFR calc Af Amer 33 (L) >60 mL/min   Anion gap 11 5 - 15  Ferritin  Result Value Ref Range   Ferritin 528 (H) 11 - 307 ng/mL  Iron and TIBC  Result Value Ref Range   Iron 56 28 - 170 ug/dL   TIBC 264 250 - 450 ug/dL   Saturation Ratios 21 10.4 - 31.8 %   UIBC 208 ug/dL  Vitamin B12  Result Value Ref Range   Vitamin B-12 301 180 - 914 pg/mL  VITAMIN D 25 Hydroxy (Vit-D Deficiency, Fractures)  Result Value Ref Range   Vit D, 25-Hydroxy 56.84 30 - 100 ng/mL     Assessment/ Plan: Janet was seen today for right outer ear red, swollen, and painful and right foot swollen.  Diagnoses and all orders for this visit:  Cellulitis of ear, right Doxycycline for cellulitis treatment given kidney impairment. Return for new or worsening symptoms, or if symptoms do not improve in 2-3 days. May take tylenol for pain management.  -     doxycycline (VIBRA-TABS) 100 MG tablet; Take 1 tablet (100 mg total) by mouth 2 (two) times daily. 1 po bid  Right ankle swelling No indications on exam for DVT. Muscle strain vs edema from chronic illnesses. Rest, elevated, ice, heat discussed. May take tylenol for discomfort. Offered ASO wrap in office, but patient declined. Compression stockings if swelling continues. RTO for new or worsening symptoms.    Essential hypertension BP well controlled on home reading. Continue to check BP at home and RTO for consistently elevated home readings.  The above assessment and management plan was discussed with the patient. The patient verbalized understanding of and has agreed to the management plan. Patient is aware to call the clinic if symptoms persist or worsen. Patient is aware when to return to the clinic for a follow-up visit. Patient educated on when it is appropriate to go to the emergency department.   Marjorie Smolder, FNP-C [facility] [address] [place], [place] [address] [phone]

## 2020-05-04 NOTE — Patient Instructions (Signed)
Muscle Strain  A muscle strain is an injury that occurs when a muscle is stretched beyond its normal length. Usually, a small number of muscle fibers are torn when this happens. There are three types of muscle strains. First-degree strains have the least amount of muscle fiber tearing and the least amount of pain. Second-degree and third-degree strains have more tearing and pain.  Usually, recovery from muscle strain takes 1-2 weeks. Complete healing normally takes 5-6 weeks.  What are the causes?  This condition is caused when a sudden, violent force is placed on a muscle and stretches it too far. This may occur with a fall, lifting, or sports.  What increases the risk?  This condition is more likely to develop in athletes and people who are physically active.  What are the signs or symptoms?  Symptoms of this condition include:  · Pain.  · Bruising.  · Swelling.  · Trouble using the muscle.  How is this diagnosed?  This condition is diagnosed based on a physical exam and your medical history. Tests may also be done, including an X-ray, ultrasound, or MRI.  How is this treated?  This condition is initially treated with PRICE therapy. This therapy involves:  · Protecting the muscle from being injured again.  · Resting the injured muscle.  · Icing the injured muscle.  · Applying pressure (compression) to the injured muscle. This may be done with a splint or elastic bandage.  · Raising (elevating) the injured muscle.  Your health care provider may also recommend medicine for pain.  Follow these instructions at home:  If you have a splint:  · Wear the splint as told by your health care provider. Remove it only as told by your health care provider.  · Loosen the splint if your fingers or toes tingle, become numb, or turn cold and blue.  · Keep the splint clean.  · If the splint is not waterproof:  ? Do not let it get wet.  ? Cover it with a watertight covering when you take a bath or a shower.  Managing pain, stiffness,  and swelling    · If directed, put ice on the injured area.  ? If you have a removable splint, remove it as told by your health care provider.  ? Put ice in a plastic bag.  ? Place a towel between your skin and the bag.  ? Leave the ice on for 20 minutes, 2-3 times a day.  · Move your fingers or toes often to avoid stiffness and to lessen swelling.  · Raise (elevate) the injured area above the level of your heart while you are sitting or lying down.  · Wear an elastic bandage as told by your health care provider. Make sure that it is not too tight.  General instructions  · Take over-the-counter and prescription medicines only as told by your health care provider.  · Restrict your activity and rest the injured muscle as told by your health care provider. Gentle movements may be allowed.  · If physical therapy was prescribed, do exercises as told by your health care provider.  · Do not put pressure on any part of the splint until it is fully hardened. This may take several hours.  · Do not use any products that contain nicotine or tobacco, such as cigarettes and e-cigarettes. These can delay bone healing. If you need help quitting, ask your health care provider.  · Ask your health care provider when it   You have more pain or swelling in the injured area. Get help right away if:  You have numbness or tingling or lose a lot of strength in the injured area. Summary  A muscle strain is an injury that occurs when a muscle is stretched beyond its normal length.  This condition is caused when a sudden, violent force is placed on a muscle and stretches it too far.  This condition is initially treated with PRICE therapy, which involves protecting, resting,  icing, compressing, and elevating.  Gentle movements may be allowed. If physical therapy was prescribed, do exercises as told by your health care provider. This information is not intended to replace advice given to you by your health care provider. Make sure you discuss any questions you have with your health care provider. Document Revised: 07/17/2017 Document Reviewed: 09/10/2016 Elsevier Patient Education  [place]. Cellulitis, Adult  Cellulitis is a skin infection. The infected area is usually warm, red, swollen, and tender. This condition occurs most often in the arms and lower legs. The infection can travel to the muscles, blood, and underlying tissue and become serious. It is very important to get treated for this condition. What are the causes? Cellulitis is caused by bacteria. The bacteria enter through a break in the skin, such as a cut, burn, insect bite, open sore, or crack. What increases the risk? This condition is more likely to occur in people who:  Have a weak body defense system (immune system).  Have open wounds on the skin, such as cuts, burns, bites, and scrapes. Bacteria can enter the body through these open wounds.  Are older than 84 years of age.  Have diabetes.  Have a type of long-lasting (chronic) liver disease (cirrhosis) or kidney disease.  Are obese.  Have a skin condition such as: ? Itchy rash (eczema). ? Slow movement of blood in the veins (venous stasis). ? Fluid buildup below the skin (edema).  Have had radiation therapy.  Use IV drugs. What are the signs or symptoms? Symptoms of this condition include:  Redness, streaking, or spotting on the skin.  Swollen area of the skin.  Tenderness or pain when an area of the skin is touched.  Warm skin.  A fever.  Chills.  Blisters. How is this diagnosed? This condition is diagnosed based on a medical history and physical exam. You may also have tests, including:  Blood  tests.  Imaging tests. How is this treated? Treatment for this condition may include:  Medicines, such as antibiotic medicines or medicines to treat allergies (antihistamines).  Supportive care, such as rest and application of cold or warm cloths (compresses) to the skin.  Hospital care, if the condition is severe. The infection usually starts to get better within 1-2 days of treatment. Follow these instructions at home:  Medicines  Take over-the-counter and prescription medicines only as told by your health care provider.  If you were prescribed an antibiotic medicine, take it as told by your health care provider. Do not stop taking the antibiotic even if you start to feel better. General instructions  Drink enough fluid to keep your urine pale yellow.  Do not touch or rub the infected area.  Raise (elevate) the infected area above the level of your heart while you are sitting or lying down.  Apply warm or cold compresses to the affected area as told by your health care provider.  Keep all follow-up visits as told by your health care provider. This is important.  These visits let your health care provider make sure a more serious infection is not developing. Contact a health care provider if:  You have a fever.  Your symptoms do not begin to improve within 1-2 days of starting treatment.  Your bone or joint underneath the infected area becomes painful after the skin has healed.  Your infection returns in the same area or another area.  You notice a swollen bump in the infected area.  You develop new symptoms.  You have a general ill feeling (malaise) with muscle aches and pains. Get help right away if:  Your symptoms get worse.  You feel very sleepy.  You develop vomiting or diarrhea that persists.  You notice red streaks coming from the infected area.  Your red area gets larger or turns dark in color. These symptoms may represent a serious problem that is an  emergency. Do not wait to see if the symptoms will go away. Get medical help right away. Call your local emergency services (911 in the [place] Do not drive yourself to the hospital. Summary  Cellulitis is a skin infection. This condition occurs most often in the arms and lower legs.  Treatment for this condition may include medicines, such as antibiotic medicines or antihistamines.  Take over-the-counter and prescription medicines only as told by your health care provider. If you were prescribed an antibiotic medicine, do not stop taking the antibiotic even if you start to feel better.  Contact a health care provider if your symptoms do not begin to improve within 1-2 days of starting treatment or your symptoms get worse.  Keep all follow-up visits as told by your health care provider. This is important. These visits let your health care provider make sure that a more serious infection is not developing. This information is not intended to replace advice given to you by your health care provider. Make sure you discuss any questions you have with your health care provider. Document Revised: 12/24/2017 Document Reviewed: 12/24/2017 Elsevier Patient Education  [place].

## 2020-05-09 ENCOUNTER — Telehealth: Payer: Self-pay | Admitting: Family Medicine

## 2020-05-09 NOTE — Telephone Encounter (Signed)
She needs to be seen again if it has not healed

## 2020-05-09 NOTE — Telephone Encounter (Signed)
  Prescription Request  05/09/2020  What is the name of the medication or equipment? Pt infection is not healed and wants some more meds [facility]  Have you contacted your pharmacy to request a refill? (if applicable) no  Which [facility] you [facility]? [facility]   Patient notified that their request is being sent to the clinical staff for review and that they should receive a response within 2 business days.

## 2020-05-09 NOTE — Telephone Encounter (Signed)
Pt has appt 05/10/20 with Dettinger

## 2020-05-10 ENCOUNTER — Ambulatory Visit (INDEPENDENT_AMBULATORY_CARE_PROVIDER_SITE_OTHER): Payer: PPO | Admitting: Family Medicine

## 2020-05-10 ENCOUNTER — Encounter: Payer: Self-pay | Admitting: Family Medicine

## 2020-05-10 ENCOUNTER — Other Ambulatory Visit: Payer: Self-pay

## 2020-05-10 VITALS — BP 169/61 | HR 54 | Temp 97.0°F | Ht 65.0 in | Wt 116.0 lb

## 2020-05-10 DIAGNOSIS — H6011 Cellulitis of right external ear: Secondary | ICD-10-CM | POA: Diagnosis not present

## 2020-05-10 MED ORDER — DOXYCYCLINE HYCLATE 100 MG PO TABS: 100.0000 mg | ORAL_TABLET | Freq: Two times a day (BID) | ORAL | 0 refills | Status: DC

## 2020-05-10 NOTE — Progress Notes (Signed)
BP (!) 169/61   Pulse (!) 54   Temp (!) 97 F (36.1 C)   Ht 5\' 5"  (1.651 m)   Wt 52.6 kg   SpO2 97%   BMI 19.30 kg/m    Subjective:   Patient ID: [name], female    DOB: 01/13/1934, 86 y.o.   MRN: [id number]  HPI: [name] is a 86 y.o. female presenting on 05/10/2020 for Ear Pain (right. Will finish ATB tomorrow. Seen last week. Wonders if it is from insect bite)   Patient presented to the clinic after two week history of swelling, erythema and warmth of the right ear. Patient was seen in the office last week and was prescribed doxycyline which has helped but not resolved symptoms. Patient states she has also been putting over the counter neosporin on her ear. She thinks she was bitten by a bug but did not see one. Patient states the pain is not a 4/10 but was a 10/10 last week. She denies headache, fever, or hearing changes. She does report having a tick bite in august and September but denies arthralgias, bulls-eye rash, or weakness.       Relevant past medical, surgical, family and social history reviewed and updated as indicated. Interim medical history since our last visit reviewed. Allergies and medications reviewed and updated.  Review of Systems  Constitutional: Negative for chills, fatigue and fever.  HENT: Positive for ear pain. Negative for facial swelling and hearing loss.   Respiratory: Negative for cough, chest tightness and shortness of breath.   Cardiovascular: Negative for chest pain, palpitations and leg swelling.  Gastrointestinal: Negative for abdominal pain, constipation, diarrhea, nausea and vomiting.  Genitourinary: Negative for difficulty urinating.  Musculoskeletal: Negative for arthralgias and joint swelling.  Skin: Positive for color change and rash.  Allergic/Immunologic: Negative for environmental allergies and food allergies.    Per HPI unless specifically indicated above   Allergies as of 05/10/2020      Reactions   Amlodipine  Other (See Comments)   edema   Isosorbide Nitrate Nausea Only   Sulfonamide Derivatives Nausea Only, Rash   Terazosin Hives, Nausea Only      Medication List       Accurate as of May 10, 2020 10:47 AM. If you have any questions, ask your nurse or doctor.        acetaminophen 325 MG tablet Commonly known as: TYLENOL Take 325 mg by mouth daily as needed for moderate pain or headache.   atenolol-chlorthalidone 50-25 MG tablet Commonly known as: TENORETIC TAKE 1 TABLET BY MOUTH EVERY DAY   atorvastatin 20 MG tablet Commonly known as: LIPITOR TAKE 1/2 TABLET BY MOUTH EVERY DAY   calcium-vitamin D 500-200 MG-UNIT tablet Commonly known as: OSCAL WITH D Take 2 tablets by mouth at bedtime.   cholecalciferol 1000 units tablet Commonly known as: VITAMIN D Take 1,000 Units by mouth daily.   doxycycline 100 MG tablet Commonly known as: VIBRA-TABS Take 1 tablet (100 mg total) by mouth 2 (two) times daily. 1 po bid   ferrous sulfate 325 (65 FE) MG tablet Take 325 mg by mouth 2 (two) times daily with a meal.   flecainide 50 MG tablet Commonly known as: TAMBOCOR TAKE 1 TABLET BY MOUTH TWICE A DAY   fluticasone 50 MCG/ACT nasal spray Commonly known as: FLONASE Place 1 spray into both nostrils 2 (two) times daily.   hydrALAZINE 100 MG tablet Commonly known as: APRESOLINE Take 100 mg by mouth  2 (two) times daily.   hydroxypropyl methylcellulose / hypromellose 2.5 % ophthalmic solution Commonly known as: ISOPTO TEARS / GONIOVISC Place 1 drop into both eyes daily.   iron polysaccharides 150 MG capsule Commonly known as: NIFEREX Take 1 capsule (150 mg total) by mouth daily.   letrozole 2.5 MG tablet Commonly known as: FEMARA Take 1 tablet (2.5 mg total) by mouth daily.   levothyroxine 75 MCG tablet Commonly known as: SYNTHROID TAKE 1 TABLET BY MOUTH EVERY DAY   lisinopril 40 MG tablet Commonly known as: ZESTRIL Take 1 tablet by mouth daily.   ondansetron 4 MG  tablet Commonly known as: Zofran Take 1 tablet (4 mg total) by mouth every 8 (eight) hours as needed for nausea or vomiting.        Objective:   BP (!) 169/61   Pulse (!) 54   Temp (!) 97 F (36.1 C)   Ht 5\' 5"  (1.651 m)   Wt 52.6 kg   SpO2 97%   BMI 19.30 kg/m   Wt Readings from Last 3 Encounters:  05/10/20 52.6 kg  05/04/20 52.2 kg  04/04/20 53 kg    Physical Exam Constitutional:      General: She is not in acute distress.    Appearance: Normal appearance. She is normal weight.  HENT:     Head: Normocephalic.     Right Ear: Tympanic membrane and ear canal normal.     Left Ear: Tympanic membrane, ear canal and external ear normal.  Cardiovascular:     Rate and Rhythm: Normal rate.     Pulses: Normal pulses.     Heart sounds: Murmur heard.  No friction rub.  Pulmonary:     Effort: Pulmonary effort is normal.     Breath sounds: Normal breath sounds.  Abdominal:     General: There is no distension.     Palpations: Abdomen is soft.  Skin:    General: Skin is warm and dry.     Findings: Erythema present.     Comments: Erythema and swelling in the apex of the right ear, improvement from last week. Pain to palpation over the apex of the ear.  Neurological:     Mental Status: She is alert.       Assessment & Plan:   Problem List Items Addressed This Visit    None    Visit Diagnoses    Cellulitis of ear, right    -  Primary     Plan: Patient's condition was discussed with him and the following plan was determined:    1.  We will prescribe 5 day course of doxycyline for cellulitis. Take one 100mg  tablet by mouth twice a day.    2. We recommend getting Volteran Gel over the counter for pain control.   Plan was discussed with Dr. Caryl Pina, MD who is in agreement with stated plan.    Maxie Better PA-S    Follow up plan: Return if symptoms worsen or fail to improve.  Counseling provided for all of the vaccine components No orders of the  defined types were placed in this encounter.  Patient seen and examined with Maxie Better, PA student, it appears like the patient still has a small amount of cellulitis but that is drastically improved per patient, will give another short course of the antibiotic and follow-up from there. Caryl Pina, MD [facility] 05/10/2020, 10:47 AM

## 2020-05-18 ENCOUNTER — Other Ambulatory Visit: Payer: Self-pay

## 2020-05-18 ENCOUNTER — Ambulatory Visit (INDEPENDENT_AMBULATORY_CARE_PROVIDER_SITE_OTHER): Payer: PPO | Admitting: Family Medicine

## 2020-05-18 ENCOUNTER — Encounter: Payer: Self-pay | Admitting: Family Medicine

## 2020-05-18 VITALS — BP 198/67 | HR 54 | Temp 98.4°F | Ht 65.0 in | Wt 114.0 lb

## 2020-05-18 DIAGNOSIS — S60212A Contusion of left wrist, initial encounter: Secondary | ICD-10-CM | POA: Diagnosis not present

## 2020-05-18 DIAGNOSIS — S60219A Contusion of unspecified wrist, initial encounter: Secondary | ICD-10-CM

## 2020-05-18 NOTE — Progress Notes (Signed)
BP (!) 198/67   Pulse (!) 54   Temp 98.4 F (36.9 C)   Ht 5\' 5"  (1.651 m)   Wt 114 lb (51.7 kg)   SpO2 97%   BMI 18.97 kg/m    Subjective:   Patient ID: [name], female    DOB: 02-26-1934, 86 y.o.   MRN: [id number]  HPI: [name] is a 86 y.o. female presenting on 05/18/2020 for Arm Pain (Left. Swelling and pain. Started Monday. Denies injury.)   HPI Patient comes in for left arm pain and swelling.  She says she noticed some left arm pain and swelling on Monday that started on the back of her hand near her wrist and extended up into her forearm on the back of her arm.  She says it was swollen and slightly painful and this was 5 days ago and it has come down since then the pain has improved.  She still just slightly tender in part of her forearm but the swelling is almost back to normal.  Patient denies any injury or fall.  Range of motion is intact.  Relevant past medical, surgical, family and social history reviewed and updated as indicated. Interim medical history since our last visit reviewed. Allergies and medications reviewed and updated.  Review of Systems  Constitutional: Negative for chills and fever.  Respiratory: Negative for chest tightness and shortness of breath.   Cardiovascular: Negative for chest pain and leg swelling.  Skin: Positive for color change. Negative for rash and wound.  Neurological: Negative for light-headedness and headaches.  Psychiatric/Behavioral: Negative for agitation and behavioral problems.  All other systems reviewed and are negative.   Per HPI unless specifically indicated above   Allergies as of 05/18/2020      Reactions   Amlodipine Other (See Comments)   edema   Isosorbide Nitrate Nausea Only   Sulfonamide Derivatives Nausea Only, Rash   Terazosin Hives, Nausea Only      Medication List       Accurate as of May 18, 2020 12:26 PM. If you have any questions, ask your nurse or doctor.        STOP taking these  medications   ondansetron 4 MG tablet Commonly known as: Zofran Stopped by: Worthy Rancher, MD     TAKE these medications   acetaminophen 325 MG tablet Commonly known as: TYLENOL Take 325 mg by mouth daily as needed for moderate pain or headache.   atenolol-chlorthalidone 50-25 MG tablet Commonly known as: TENORETIC TAKE 1 TABLET BY MOUTH EVERY DAY   atorvastatin 20 MG tablet Commonly known as: LIPITOR TAKE 1/2 TABLET BY MOUTH EVERY DAY   calcium-vitamin D 500-200 MG-UNIT tablet Commonly known as: OSCAL WITH D Take 2 tablets by mouth at bedtime.   cholecalciferol 1000 units tablet Commonly known as: VITAMIN D Take 1,000 Units by mouth daily.   doxycycline 100 MG tablet Commonly known as: VIBRA-TABS Take 1 tablet (100 mg total) by mouth 2 (two) times daily. 1 po bid   ferrous sulfate 325 (65 FE) MG tablet Take 325 mg by mouth 2 (two) times daily with a meal.   flecainide 50 MG tablet Commonly known as: TAMBOCOR TAKE 1 TABLET BY MOUTH TWICE A DAY   fluticasone 50 MCG/ACT nasal spray Commonly known as: FLONASE Place 1 spray into both nostrils 2 (two) times daily.   hydrALAZINE 100 MG tablet Commonly known as: APRESOLINE Take 100 mg by mouth 2 (two) times daily.   hydroxypropyl  methylcellulose / hypromellose 2.5 % ophthalmic solution Commonly known as: ISOPTO TEARS / GONIOVISC Place 1 drop into both eyes daily.   iron polysaccharides 150 MG capsule Commonly known as: NIFEREX Take 1 capsule (150 mg total) by mouth daily.   letrozole 2.5 MG tablet Commonly known as: FEMARA Take 1 tablet (2.5 mg total) by mouth daily.   levothyroxine 75 MCG tablet Commonly known as: SYNTHROID TAKE 1 TABLET BY MOUTH EVERY DAY   lisinopril 40 MG tablet Commonly known as: ZESTRIL Take 1 tablet by mouth daily.        Objective:   BP (!) 198/67   Pulse (!) 54   Temp 98.4 F (36.9 C)   Ht 5\' 5"  (1.651 m)   Wt 114 lb (51.7 kg)   SpO2 97%   BMI 18.97 kg/m   Wt  Readings from Last 3 Encounters:  05/18/20 114 lb (51.7 kg)  05/10/20 116 lb (52.6 kg)  05/04/20 115 lb (52.2 kg)    Physical Exam Vitals and nursing note reviewed.  Constitutional:      General: She is not in acute distress.    Appearance: She is not ill-appearing or toxic-appearing.  Musculoskeletal:     Right forearm: Swelling (Small amount of swelling in the forearm) present. No edema, deformity or tenderness.     Right wrist: No swelling, deformity, tenderness, bony tenderness or crepitus. Normal range of motion.  Skin:    Findings: Bruising present. No petechiae or wound.     Comments: Patient has bruising over posterior left hand and distal forearm, minimal swelling, no tenderness to palpation or with range of motion of the wrist.  Neurological:     Mental Status: She is alert.       Assessment & Plan:   Problem List Items Addressed This Visit    None    Visit Diagnoses    Bruising of wrist    -  Primary      Reassurance for patient, based on exam did not think anything was fractured and no signs of infection.  Call if anything worsens or changes Follow up plan: Return if symptoms worsen or fail to improve.  Counseling provided for all of the vaccine components No orders of the defined types were placed in this encounter.   Caryl Pina, MD [facility] 05/18/2020, 12:26 PM

## 2020-06-01 ENCOUNTER — Other Ambulatory Visit: Payer: Self-pay

## 2020-06-01 ENCOUNTER — Ambulatory Visit (INDEPENDENT_AMBULATORY_CARE_PROVIDER_SITE_OTHER): Payer: PPO | Admitting: *Deleted

## 2020-06-01 DIAGNOSIS — Z23 Encounter for immunization: Secondary | ICD-10-CM | POA: Diagnosis not present

## 2020-06-03 ENCOUNTER — Emergency Department (HOSPITAL_COMMUNITY)
Admission: EM | Admit: 2020-06-03 | Discharge: 2020-06-03 | Disposition: A | Discharge status: Home / Self Care | Payer: PPO | Attending: Emergency Medicine | Admitting: Emergency Medicine

## 2020-06-03 ENCOUNTER — Encounter (HOSPITAL_COMMUNITY): Payer: Self-pay

## 2020-06-03 ENCOUNTER — Emergency Department (HOSPITAL_COMMUNITY): Payer: PPO

## 2020-06-03 ENCOUNTER — Other Ambulatory Visit: Payer: Self-pay

## 2020-06-03 DIAGNOSIS — Z79899 Other long term (current) drug therapy: Secondary | ICD-10-CM | POA: Diagnosis not present

## 2020-06-03 DIAGNOSIS — M545 Low back pain, unspecified: Secondary | ICD-10-CM | POA: Diagnosis not present

## 2020-06-03 DIAGNOSIS — I129 Hypertensive chronic kidney disease with stage 1 through stage 4 chronic kidney disease, or unspecified chronic kidney disease: Secondary | ICD-10-CM | POA: Diagnosis not present

## 2020-06-03 DIAGNOSIS — E039 Hypothyroidism, unspecified: Secondary | ICD-10-CM | POA: Insufficient documentation

## 2020-06-03 DIAGNOSIS — K8020 Calculus of gallbladder without cholecystitis without obstruction: Secondary | ICD-10-CM | POA: Diagnosis not present

## 2020-06-03 DIAGNOSIS — J45909 Unspecified asthma, uncomplicated: Secondary | ICD-10-CM | POA: Diagnosis not present

## 2020-06-03 DIAGNOSIS — N183 Chronic kidney disease, stage 3 unspecified: Secondary | ICD-10-CM | POA: Insufficient documentation

## 2020-06-03 DIAGNOSIS — K5730 Diverticulosis of large intestine without perforation or abscess without bleeding: Secondary | ICD-10-CM | POA: Diagnosis not present

## 2020-06-03 DIAGNOSIS — R109 Unspecified abdominal pain: Secondary | ICD-10-CM | POA: Diagnosis not present

## 2020-06-03 DIAGNOSIS — I10 Essential (primary) hypertension: Secondary | ICD-10-CM | POA: Diagnosis not present

## 2020-06-03 DIAGNOSIS — M5459 Other low back pain: Secondary | ICD-10-CM | POA: Diagnosis not present

## 2020-06-03 LAB — URINALYSIS, ROUTINE W REFLEX MICROSCOPIC
Bilirubin Urine: NEGATIVE
Glucose, UA: NEGATIVE mg/dL
Hgb urine dipstick: NEGATIVE
Ketones, ur: NEGATIVE mg/dL
Leukocytes,Ua: NEGATIVE
Nitrite: NEGATIVE
Protein, ur: NEGATIVE mg/dL
Specific Gravity, Urine: 1.005 (ref 1.005–1.030)
pH: 8 (ref 5.0–8.0)

## 2020-06-03 LAB — BASIC METABOLIC PANEL
Anion gap: 11 (ref 5–15)
BUN: 26 mg/dL — ABNORMAL HIGH (ref 8–23)
CO2: 28 mmol/L (ref 22–32)
Calcium: 9.4 mg/dL (ref 8.9–10.3)
Chloride: 93 mmol/L — ABNORMAL LOW (ref 98–111)
Creatinine, Ser: 1.4 mg/dL — ABNORMAL HIGH (ref 0.44–1.00)
GFR, Estimated: 34 mL/min — ABNORMAL LOW (ref >60)
Glucose, Bld: 119 mg/dL — ABNORMAL HIGH (ref 70–99)
Potassium: 3.6 mmol/L (ref 3.5–5.1)
Sodium: 132 mmol/L — ABNORMAL LOW (ref 135–145)

## 2020-06-03 LAB — CBC WITH DIFFERENTIAL/PLATELET
Abs Immature Granulocytes: 0 10*3/uL (ref 0.00–0.07)
Basophils Absolute: 0 10*3/uL (ref 0.0–0.1)
Basophils Relative: 0 %
Eosinophils Absolute: 0 10*3/uL (ref 0.0–0.5)
Eosinophils Relative: 0 %
HCT: 32.9 % — ABNORMAL LOW (ref 36.0–46.0)
Hemoglobin: 10.7 g/dL — ABNORMAL LOW (ref 12.0–15.0)
Immature Granulocytes: 0 %
Lymphocytes Relative: 18 %
Lymphs Abs: 0.6 10*3/uL — ABNORMAL LOW (ref 0.7–4.0)
MCH: 29.1 pg (ref 26.0–34.0)
MCHC: 32.5 g/dL (ref 30.0–36.0)
MCV: 89.4 fL (ref 80.0–100.0)
Monocytes Absolute: 0.3 10*3/uL (ref 0.1–1.0)
Monocytes Relative: 9 %
Neutro Abs: 2.4 10*3/uL (ref 1.7–7.7)
Neutrophils Relative %: 73 %
Platelets: 118 10*3/uL — ABNORMAL LOW (ref 150–400)
RBC: 3.68 MIL/uL — ABNORMAL LOW (ref 3.87–5.11)
RDW: 13.4 % (ref 11.5–15.5)
WBC: 3.3 10*3/uL — ABNORMAL LOW (ref 4.0–10.5)
nRBC: 0 % (ref 0.0–0.2)

## 2020-06-03 MED ORDER — HYDROCODONE-ACETAMINOPHEN 5-325 MG PO TABS
1.0000 | ORAL_TABLET | Freq: Once | ORAL | Status: AC
  Administered 2020-06-03: 1 via ORAL
  Filled 2020-06-03: qty 1

## 2020-06-03 MED ORDER — HYDROCODONE-ACETAMINOPHEN 5-325 MG PO TABS: 1.0000 | ORAL_TABLET | Freq: Four times a day (QID) | ORAL | 0 refills | Status: DC | PRN

## 2020-06-03 MED ORDER — FENTANYL CITRATE (PF) 100 MCG/2ML IJ SOLN: 50.0000 ug | Freq: Once | INTRAMUSCULAR | Status: DC

## 2020-06-03 NOTE — ED Provider Notes (Signed)
[facility] Provider Note  CSN: [id number] Arrival date & time: 06/03/20 1023    History Chief Complaint  Patient presents with  . Back Pain    HPI  [name] is a 86 y.o. female reports she was in her normal state of health last night, woke up around 4am to urinated and when she got back into bed she noted sudden onset of severe aching R lower back and R flank pain. Does not radiate to buttock or leg. No pain in abdomen. No vomiting, diarrhea or fever. No dysuria or hematuria. No prior history of back pain or kidney stones.    Past Medical History:  Diagnosis Date  . Anemia   . Arthritis   . Asthma   . Atrial fibrillation ([place])   . Basal cell carcinoma 02/06/2009   Left ear targus- ([facility])  . Basal cell carcinoma 09/28/2008   Right back-(CX35FU)  . CKD (chronic kidney disease), stage III ([place])   . Heart murmur   . Hyperlipidemia   . Hypertension   . Hypothyroidism   . Nodule of right lung    Right upper lobe  . PONV (postoperative nausea and vomiting)   . Renal insufficiency    Chronic  . Renal vascular disease   . Right renal artery stenosis ([place]) 05/09/2015  . Squamous cell carcinoma of skin 09/07/2019   KA-Left [id number]  . Vertigo     Past Surgical History:  Procedure Laterality Date  . BREAST LUMPECTOMY WITH RADIOACTIVE SEED LOCALIZATION Right 04/08/2018   Procedure: BREAST LUMPECTOMY WITH RADIOACTIVE SEED LOCALIZATION;  Surgeon: Rolm Bookbinder, MD;  Location: [facility];  Service: General;  Laterality: Right;  . IR GENERIC HISTORICAL  08/29/2016   IR US GUIDE VASC ACCESS RIGHT 08/29/2016 [facility]  . IR GENERIC HISTORICAL  08/29/2016   IR RENAL BILAT S&I MOD SED 08/29/2016 [facility]  . KNEE ARTHROSCOPY     2019  . THYROIDECTOMY, PARTIAL Right 1981   middle lobe removed 1st, then right lobe removed 7-8 years later (approx. 1988)    Family History  Problem Relation Age of Onset  . Thyroid disease Mother   . Stroke Father    . Hypertension Brother   . Lung cancer Maternal Uncle   . Cancer Maternal Grandmother        Liver  . Lung cancer Maternal Uncle   . Hypertension Daughter   . Hypercholesterolemia Daughter   . Hypercholesterolemia Daughter     Social History   Tobacco Use  . Smoking status: Never Smoker  . Smokeless tobacco: Never Used  . Tobacco comment: Pt denies cigarettes  Vaping Use  . Vaping Use: Never used  Substance Use Topics  . Alcohol use: No  . Drug use: No     Home Medications Prior to Admission medications   Medication Sig Start Date End Date Taking? Authorizing Provider  acetaminophen (TYLENOL) 325 MG tablet Take 325 mg by mouth daily as needed for moderate pain or headache.    [provider]  atenolol-chlorthalidone (TENORETIC) 50-25 MG tablet TAKE 1 TABLET BY MOUTH EVERY DAY 04/10/20   Ronnie Doss M, DO  atorvastatin (LIPITOR) 20 MG tablet TAKE 1/2 TABLET BY MOUTH EVERY DAY 04/02/20   Ronnie Doss M, DO  calcium-vitamin D (OSCAL WITH D) 500-200 MG-UNIT per tablet Take 2 tablets by mouth at bedtime.     [provider]  cholecalciferol (VITAMIN D) 1000 units tablet Take 1,000 Units by mouth daily.  [provider]  doxycycline (VIBRA-TABS) 100 MG tablet Take 1 tablet (100 mg total) by mouth 2 (two) times daily. 1 po bid 05/10/20   Dettinger, Fransisca Kaufmann, MD  ferrous sulfate 325 (65 FE) MG tablet Take 325 mg by mouth 2 (two) times daily with a meal.    [provider]  flecainide (TAMBOCOR) 50 MG tablet TAKE 1 TABLET BY MOUTH TWICE A DAY 10/10/19   Minus Breeding, MD  fluticasone (FLONASE) 50 MCG/ACT nasal spray Place 1 spray into both nostrils 2 (two) times daily.  06/25/16   [provider]  hydrALAZINE (APRESOLINE) 100 MG tablet Take 100 mg by mouth 2 (two) times daily.    [provider]  HYDROcodone-acetaminophen (NORCO/VICODIN) 5-325 MG tablet Take 1 tablet by mouth every 6 (six) hours as needed for severe pain.  06/03/20   Truddie Hidden, MD  hydroxypropyl methylcellulose / hypromellose (ISOPTO TEARS / GONIOVISC) 2.5 % ophthalmic solution Place 1 drop into both eyes daily.     [provider]  iron polysaccharides (NIFEREX) 150 MG capsule Take 1 capsule (150 mg total) by mouth daily. 04/11/20   [name], DO  letrozole (FEMARA) 2.5 MG tablet Take 1 tablet (2.5 mg total) by mouth daily. 08/09/19   Derek Jack, MD  levothyroxine (SYNTHROID) 75 MCG tablet TAKE 1 TABLET BY MOUTH EVERY DAY 03/13/20   Ronnie Doss M, DO  lisinopril (PRINIVIL,ZESTRIL) 40 MG tablet Take 1 tablet by mouth daily. 09/17/18   [provider]     Allergies    Amlodipine, Isosorbide nitrate, Sulfonamide derivatives, and Terazosin   Review of Systems   Review of Systems A comprehensive review of systems was completed and negative except as noted in HPI.    Physical Exam BP (!) 192/71   Pulse (!) 58   Temp 98.4 F (36.9 C) (Oral)   Resp 20   Ht 5\' 6"  (1.676 m)   Wt 52.2 kg   SpO2 96%   BMI 18.56 kg/m   Physical Exam Vitals and nursing note reviewed.  Constitutional:      Appearance: Normal appearance.  HENT:     Head: Normocephalic and atraumatic.     Nose: Nose normal.     Mouth/Throat:     Mouth: Mucous membranes are moist.  Eyes:     Extraocular Movements: Extraocular movements intact.     Conjunctiva/sclera: Conjunctivae normal.  Cardiovascular:     Rate and Rhythm: Normal rate.  Pulmonary:     Effort: Pulmonary effort is normal.     Breath sounds: Normal breath sounds.  Abdominal:     General: Abdomen is flat.     Palpations: Abdomen is soft.     Tenderness: There is no abdominal tenderness.  Musculoskeletal:        General: No swelling or tenderness (in R lower back). Normal range of motion.     Cervical back: Neck supple.  Skin:    General: Skin is warm and dry.  Neurological:     General: No focal deficit present.     Mental Status: She is alert.    Psychiatric:        Mood and Affect: Mood normal.      ED Results / Procedures / Treatments   Labs (all labs ordered are listed, but only abnormal results are displayed) Labs Reviewed  BASIC METABOLIC PANEL - Abnormal; Notable for the following components:      Result Value   Sodium 132 (*)  Chloride 93 (*)    Glucose, Bld 119 (*)    BUN 26 (*)    Creatinine, Ser 1.40 (*)    GFR, Estimated 34 (*)    All other components within normal limits  CBC WITH DIFFERENTIAL/PLATELET - Abnormal; Notable for the following components:   WBC 3.3 (*)    RBC 3.68 (*)    Hemoglobin 10.7 (*)    HCT 32.9 (*)    Platelets 118 (*)    Lymphs Abs 0.6 (*)    All other components within normal limits  URINALYSIS, ROUTINE W REFLEX MICROSCOPIC - Abnormal; Notable for the following components:   Color, Urine STRAW (*)    All other components within normal limits    EKG EKG Interpretation  Date/Time:  Sunday June 03 2020 11:31:42 EDT Ventricular Rate:  62 PR Interval:    QRS Duration: 105 QT Interval:  518 QTC Calculation: 527 R Axis:   74 Text Interpretation: Sinus rhythm baseline artifact Borderline prolonged PR interval Probable left atrial enlargement RSR' in V1 or V2, right VCD or RVH Left ventricular hypertrophy Prolonged QT interval No significant change since last tracing Confirmed by Calvert Cantor [phone]) on 06/03/2020 11:41:55 AM   Radiology CT Renal Stone Study  Result Date: 06/03/2020 CLINICAL DATA:  Right-sided flank pain EXAM: CT ABDOMEN AND PELVIS WITHOUT CONTRAST TECHNIQUE: Multidetector CT imaging of the abdomen and pelvis was performed following the standard protocol without IV contrast. COMPARISON:  11/09/2014 FINDINGS: Lower chest: No acute abnormality. Hepatobiliary: No solid liver abnormality is seen. Gallstones in the gallbladder fundus. No gallbladder wall thickening, or biliary dilatation. Pancreas: Unremarkable. No pancreatic ductal dilatation or surrounding  inflammatory changes. Spleen: Normal in size without significant abnormality. Adrenals/Urinary Tract: Adrenal glands are unremarkable. Kidneys are normal, without renal calculi, solid lesion, or hydronephrosis. Bladder is unremarkable. Stomach/Bowel: Stomach is within normal limits. Appendix appears normal. No evidence of bowel wall thickening, distention, or inflammatory changes. Sigmoid diverticulosis. Vascular/Lymphatic: No significant vascular findings are present. No enlarged abdominal or pelvic lymph nodes. Reproductive: No mass or other significant abnormality. Other: No abdominal wall hernia or abnormality. No abdominopelvic ascites. Musculoskeletal: No acute or significant osseous findings. IMPRESSION: 1. No non-contrast CT findings of the abdomen or pelvis to explain right-sided flank pain. No evidence of urinary tract calculus or hydronephrosis. 2. Cholelithiasis. 3. Sigmoid diverticulosis without evidence of acute diverticulitis. Electronically Signed   By: Eddie Candle M.D.   On: 06/03/2020 12:48    Procedures Procedures  Medications Ordered in the ED Medications  HYDROcodone-acetaminophen (NORCO/VICODIN) 5-325 MG per tablet 1 tablet (1 tablet Oral Given 06/03/20 1234)     MDM Rules/Calculators/A&P MDM Patient with R flank pain, no red flags, could be MSK or kidney stone. Will check labs, CT and pain meds for comfort.  ED Course  I have reviewed the triage vital signs and the nursing notes.  Pertinent labs & imaging results that were available during my care of the patient were reviewed by me and considered in my medical decision making (see chart for details).  Clinical Course as of Jun 03 1334  Sun Jun 03, 2020  1257 CT and UA are neg.    [CS]  1311 CBC and BMP are about at baseline.    [CS]  1332 Patient reports some improvement in pain. She does not have any findings concerning for occult intraabdominal process. Aortal calibre is normal on noncontrasted CT, doubt AAA or  dissection. She would like to go home, Rx for pain medications  for the next couple of days, rest, heat and PCP follow up. RTED for any other concerns.    [CS]    Clinical Course User Index [CS] Truddie Hidden, MD    Final Clinical Impression(s) / ED Diagnoses Final diagnoses:  Acute right-sided low back pain without sciatica    Rx / DC Orders ED Discharge Orders         Ordered    HYDROcodone-acetaminophen (NORCO/VICODIN) 5-325 MG tablet  Every 6 hours PRN        06/03/20 1335           Truddie Hidden, MD 06/03/20 1335

## 2020-06-03 NOTE — ED Triage Notes (Signed)
Pt to er, pt states that she was awakened from sleep this am with some sharp back pain, denies injury or heavy lifting, states that she has not had a similar episode.

## 2020-06-06 ENCOUNTER — Ambulatory Visit: Payer: PPO

## 2020-06-19 ENCOUNTER — Ambulatory Visit (INDEPENDENT_AMBULATORY_CARE_PROVIDER_SITE_OTHER): Payer: PPO | Admitting: Family Medicine

## 2020-06-19 ENCOUNTER — Other Ambulatory Visit: Payer: Self-pay

## 2020-06-19 ENCOUNTER — Encounter: Payer: Self-pay | Admitting: Family Medicine

## 2020-06-19 VITALS — BP 155/52 | HR 55 | Temp 97.5°F | Ht 66.0 in | Wt 115.0 lb

## 2020-06-19 DIAGNOSIS — N1831 Chronic kidney disease, stage 3a: Secondary | ICD-10-CM | POA: Diagnosis not present

## 2020-06-19 DIAGNOSIS — D696 Thrombocytopenia, unspecified: Secondary | ICD-10-CM | POA: Diagnosis not present

## 2020-06-19 DIAGNOSIS — D692 Other nonthrombocytopenic purpura: Secondary | ICD-10-CM | POA: Diagnosis not present

## 2020-06-19 DIAGNOSIS — I480 Paroxysmal atrial fibrillation: Secondary | ICD-10-CM

## 2020-06-19 DIAGNOSIS — I10 Essential (primary) hypertension: Secondary | ICD-10-CM

## 2020-06-19 DIAGNOSIS — D631 Anemia in chronic kidney disease: Secondary | ICD-10-CM

## 2020-06-19 DIAGNOSIS — E034 Atrophy of thyroid (acquired): Secondary | ICD-10-CM

## 2020-06-19 DIAGNOSIS — N1832 Chronic kidney disease, stage 3b: Secondary | ICD-10-CM

## 2020-06-19 NOTE — Progress Notes (Signed)
Subjective: CC: f/u Hypothyroidism, CKD3, afib, HTN PCP: [name], DO ALP:[id number] [name] is a 86 y.o. female presenting to clinic today for:  1. Hypothyroidism, post surgical Compliant with Synthroid.  Denies any heart palpitation, tremor, change in bowel habit or weight  2. HTN w/ CKD3, atrial fibrillation, anemia Continued on Tenoretic 50-25, Tambocor, hydralazine and lisinopril.  She reports blood pressures range from systolics of 409B to 353G.  Typically the morning blood pressures under extremely good control whereas the evening blood pressures can fluctuate anywhere from normal to 150s.  Denies any chest pain, shortness of breath, dizziness, edema or falls.  She has had some easy bruising.  She continues to struggle with low platelets and iron deficiency anemia.  She was on ferrous sulfate 325 twice daily but apparently this was on back order and was replaced with Niferex-150.  She did have a slight drop in her hemoglobin after this change, 11.6 > 10.7.  No blood loss.  ROS: Per HPI  Allergies  Allergen Reactions   Amlodipine Other (See Comments)    edema   Isosorbide Nitrate Nausea Only   Sulfonamide Derivatives Nausea Only and Rash   Terazosin Hives and Nausea Only   Past Medical History:  Diagnosis Date   Anemia    Arthritis    Asthma    Atrial fibrillation ([place])    Basal cell carcinoma 02/06/2009   Left ear targus- ([facility])   Basal cell carcinoma 09/28/2008   Right back-(CX35FU)   CKD (chronic kidney disease), stage III (HCC)    Heart murmur    Hyperlipidemia    Hypertension    Hypothyroidism    Nodule of right lung    Right upper lobe   PONV (postoperative nausea and vomiting)    Renal insufficiency    Chronic   Renal vascular disease    Right renal artery stenosis (HCC) 05/09/2015   Squamous cell carcinoma of skin 09/07/2019   KA-Left shin-txpbx   Vertigo     Current Outpatient Medications:    acetaminophen (TYLENOL) 325  MG tablet, Take 325 mg by mouth daily as needed for moderate pain or headache., Disp: , Rfl:    atenolol-chlorthalidone (TENORETIC) 50-25 MG tablet, TAKE 1 TABLET BY MOUTH EVERY DAY, Disp: 90 tablet, Rfl: 1   atorvastatin (LIPITOR) 20 MG tablet, TAKE 1/2 TABLET BY MOUTH EVERY DAY, Disp: 45 tablet, Rfl: 0   calcium-vitamin D (OSCAL WITH D) 500-200 MG-UNIT per tablet, Take 2 tablets by mouth at bedtime. , Disp: , Rfl:    cholecalciferol (VITAMIN D) 1000 units tablet, Take 1,000 Units by mouth daily., Disp: , Rfl:    doxycycline (VIBRA-TABS) 100 MG tablet, Take 1 tablet (100 mg total) by mouth 2 (two) times daily. 1 po bid, Disp: 20 tablet, Rfl: 0   ferrous sulfate 325 (65 FE) MG tablet, Take 325 mg by mouth 2 (two) times daily with a meal., Disp: , Rfl:    flecainide (TAMBOCOR) 50 MG tablet, TAKE 1 TABLET BY MOUTH TWICE A DAY, Disp: 180 tablet, Rfl: 2   fluticasone (FLONASE) 50 MCG/ACT nasal spray, Place 1 spray into both nostrils 2 (two) times daily. , Disp: , Rfl:    hydrALAZINE (APRESOLINE) 100 MG tablet, Take 100 mg by mouth 2 (two) times daily., Disp: , Rfl:    HYDROcodone-acetaminophen (NORCO/VICODIN) 5-325 MG tablet, Take 1 tablet by mouth every 6 (six) hours as needed for severe pain., Disp: 12 tablet, Rfl: 0   hydroxypropyl methylcellulose / hypromellose (ISOPTO  TEARS / GONIOVISC) 2.5 % ophthalmic solution, Place 1 drop into both eyes daily. , Disp: , Rfl:    iron polysaccharides (NIFEREX) 150 MG capsule, Take 1 capsule (150 mg total) by mouth daily., Disp: 30 capsule, Rfl: 12   letrozole (FEMARA) 2.5 MG tablet, Take 1 tablet (2.5 mg total) by mouth daily., Disp: 90 tablet, Rfl: 4   levothyroxine (SYNTHROID) 75 MCG tablet, TAKE 1 TABLET BY MOUTH EVERY DAY, Disp: 90 tablet, Rfl: 3   lisinopril (PRINIVIL,ZESTRIL) 40 MG tablet, Take 1 tablet by mouth daily., Disp: , Rfl:   Current Facility-Administered Medications:    methylPREDNISolone acetate (DEPO-MEDROL) injection 40 mg, 40  mg, Intramuscular, Once, Dettinger, Fransisca Kaufmann, MD Social History   Socioeconomic History   Marital status: Widowed    Spouse name: Not on file   Number of children: 2   Years of education: 16   Highest education level: Not on file  Occupational History   Occupation: Retired  Tobacco Use   Smoking status: Never Smoker   Smokeless tobacco: Never Used   Tobacco comment: Pt denies cigarettes  Vaping Use   Vaping Use: Never used  Substance and Sexual Activity   Alcohol use: No   Drug use: No   Sexual activity: Never  Other Topics Concern   Not on file  Social History Narrative   Lives alone   Ambidextrous (writes with left hand).   No caffeine use.   Social Determinants of Health   Financial Resource Strain:    Difficulty of Paying Living Expenses: Not on file  Food Insecurity:    Worried About Charity fundraiser in the Last Year: Not on file   [employer] of Food in the Last Year: Not on file  Transportation Needs:    Lack of Transportation (Medical): Not on file   Lack of Transportation (Non-Medical): Not on file  Physical Activity:    Days of Exercise per Week: Not on file   Minutes of Exercise per Session: Not on file  Stress:    Feeling of Stress : Not on file  Social Connections:    Frequency of Communication with Friends and Family: Not on file   Frequency of Social Gatherings with Friends and Family: Not on file   Attends Religious Services: Not on file   Active Member of Clubs or Organizations: Not on file   Attends Archivist Meetings: Not on file   Marital Status: Not on file  Intimate Partner Violence:    Fear of Current or Ex-Partner: Not on file   Emotionally Abused: Not on file   Physically Abused: Not on file   Sexually Abused: Not on file   Family History  Problem Relation Age of Onset   Thyroid disease Mother    Stroke Father    Hypertension Brother    Lung cancer Maternal Uncle    Cancer Maternal  Grandmother        Liver   Lung cancer Maternal Uncle    Hypertension Daughter    Hypercholesterolemia Daughter    Hypercholesterolemia Daughter     Objective: Office vital signs reviewed. BP (!) 155/52    Pulse (!) 55    Temp (!) 97.5 F (36.4 C)    Ht 5\' 6"  (1.676 m)    Wt 115 lb (52.2 kg)    SpO2 94%    BMI 18.56 kg/m   Physical Examination:  General: Awake, alert, well appearing, thin elderly female, No acute distress HEENT: Normal, sclera  white, MMM  Cardio: r regular rate and rhythm.  2 out of 6 systolic ejection murmur noted Pulm: clear to auscultation bilaterally, no wheezes, rhonchi or rales; normal work of breathing on room air Skin: Purpura noted throughout bilateral arms Neuro: No tremor  Assessment/ Plan: 86 y.o. female   Hypothyroidism due to acquired atrophy of thyroid - Plan: Thyroid Panel With TSH  Paroxysmal atrial fibrillation ([place])  Essential hypertension  Stage 3a chronic kidney disease ([place]) - Plan: Renal Function Panel, Ferritin, Iron  Anemia due to stage 3b chronic kidney disease ([place]) - Plan: CBC with Differential, Ferritin, Iron  Thrombocytopenia (HCC) - Plan: CBC with Differential, Ferritin, Iron  Purpura senilis (HCC) - Plan: Ferritin, Iron  Asymptomatic from a thyroid standpoint.  Check thyroid panel.  She was rate and rhythm controlled.  Blood pressure is a little elevated this morning but she has only taken her blood pressure medicine within the last hour or so.  Home ranges are somewhat fluctuant.  I hesitate to advance blood pressure medication in this patient at this time given normal reports at home.  With regards to her Niferex, unfortunately this can only be dosed once daily.  We could consider switching her ferrous sulfate to an alternative medication.  I will see if our pharmacist can help with selection.  For now, she should have about a month and a half of the ferrous sulfate available to her.  We will plan to recheck CBC, iron  and ferritin today.  We will also recheck her sodium level given that she was hyponatremic on last laboratory draw.  No orders of the defined types were placed in this encounter.  No orders of the defined types were placed in this encounter.    [name], DO [place] [phone]

## 2020-06-19 NOTE — Patient Instructions (Signed)

## 2020-06-20 LAB — CBC WITH DIFFERENTIAL/PLATELET
Basophils Absolute: 0 10*3/uL (ref 0.0–0.2)
Basos: 1 %
EOS (ABSOLUTE): 0 10*3/uL (ref 0.0–0.4)
Eos: 1 %
Hematocrit: 33.7 % — ABNORMAL LOW (ref 34.0–46.6)
Hemoglobin: 10.9 g/dL — ABNORMAL LOW (ref 11.1–15.9)
Immature Grans (Abs): 0 10*3/uL (ref 0.0–0.1)
Immature Granulocytes: 1 %
Lymphocytes Absolute: 0.8 10*3/uL (ref 0.7–3.1)
Lymphs: 25 %
MCH: 28.6 pg (ref 26.6–33.0)
MCHC: 32.3 g/dL (ref 31.5–35.7)
MCV: 89 fL (ref 79–97)
Monocytes Absolute: 0.3 10*3/uL (ref 0.1–0.9)
Monocytes: 9 %
Neutrophils Absolute: 2 10*3/uL (ref 1.4–7.0)
Neutrophils: 63 %
Platelets: 115 10*3/uL — ABNORMAL LOW (ref 150–450)
RBC: 3.81 x10E6/uL (ref 3.77–5.28)
RDW: 12.9 % (ref 11.7–15.4)
WBC: 3.1 10*3/uL — ABNORMAL LOW (ref 3.4–10.8)

## 2020-06-20 LAB — THYROID PANEL WITH TSH
Free Thyroxine Index: 2.7 (ref 1.2–4.9)
T3 Uptake Ratio: 31 % (ref 24–39)
T4, Total: 8.8 ug/dL (ref 4.5–12.0)
TSH: 2.66 u[IU]/mL (ref 0.450–4.500)

## 2020-06-20 LAB — RENAL FUNCTION PANEL
Albumin: 4.1 g/dL (ref 3.6–4.6)
BUN/Creatinine Ratio: 18 (ref 12–28)
BUN: 26 mg/dL (ref 8–27)
CO2: 30 mmol/L — ABNORMAL HIGH (ref 20–29)
Calcium: 9.5 mg/dL (ref 8.7–10.3)
Chloride: 96 mmol/L (ref 96–106)
Creatinine, Ser: 1.47 mg/dL — ABNORMAL HIGH (ref 0.57–1.00)
GFR calc Af Amer: 38 mL/min/{1.73_m2} — ABNORMAL LOW (ref >59)
GFR calc non Af Amer: 33 mL/min/{1.73_m2} — ABNORMAL LOW (ref >59)
Glucose: 110 mg/dL — ABNORMAL HIGH (ref 65–99)
Phosphorus: 3.8 mg/dL (ref 3.0–4.3)
Potassium: 4.2 mmol/L (ref 3.5–5.2)
Sodium: 137 mmol/L (ref 134–144)

## 2020-06-20 LAB — FERRITIN: Ferritin: 790 ng/mL — ABNORMAL HIGH (ref 15–150)

## 2020-06-20 LAB — IRON: Iron: 49 ug/dL (ref 27–139)

## 2020-06-21 DIAGNOSIS — S51812A Laceration without foreign body of left forearm, initial encounter: Secondary | ICD-10-CM | POA: Diagnosis not present

## 2020-06-21 DIAGNOSIS — W268XXA Contact with other sharp object(s), not elsewhere classified, initial encounter: Secondary | ICD-10-CM | POA: Diagnosis not present

## 2020-06-22 ENCOUNTER — Telehealth: Payer: Self-pay | Admitting: *Deleted

## 2020-06-22 NOTE — Telephone Encounter (Signed)
Pt has a left arm would that need dressing changes   We have not seen pt for this.   They would like an order for Home health skilled nurse for dressing changes.   She does not currently have [facility] and would need new start order.  LM for pt to call back - would need face to face for this wound if that is what the pt wants.   Note - she may have family that can help her with this.

## 2020-06-25 NOTE — Telephone Encounter (Signed)
Pt called in this morning [facility] came to house Friday. She feels that she can dressing it on her own.

## 2020-06-28 ENCOUNTER — Other Ambulatory Visit: Payer: Self-pay | Admitting: Family Medicine

## 2020-07-19 ENCOUNTER — Other Ambulatory Visit: Payer: Self-pay

## 2020-07-19 MED ORDER — FLUTICASONE PROPIONATE 50 MCG/ACT NA SUSP
1.0000 | Freq: Two times a day (BID) | NASAL | 5 refills | Status: DC
Start: 2020-07-19 — End: 2021-08-09

## 2020-07-19 NOTE — Telephone Encounter (Signed)
  Prescription Request  07/19/2020  What is the name of the medication or equipment? Fluticasone   Have you contacted your pharmacy to request a refill? (if applicable) yes  Which [facility] you [facility]   Patient notified that their request is being sent to the clinical staff for review and that they should receive a response within 2 business days.

## 2020-07-20 NOTE — Telephone Encounter (Signed)
LMOVM refill sent to pharmacy

## 2020-07-24 ENCOUNTER — Ambulatory Visit: Payer: PPO | Admitting: Family

## 2020-08-24 ENCOUNTER — Ambulatory Visit: Payer: PPO | Admitting: Nurse Practitioner

## 2020-08-25 DIAGNOSIS — M79641 Pain in right hand: Secondary | ICD-10-CM | POA: Diagnosis not present

## 2020-08-25 DIAGNOSIS — R2233 Localized swelling, mass and lump, upper limb, bilateral: Secondary | ICD-10-CM | POA: Diagnosis not present

## 2020-08-25 DIAGNOSIS — M79642 Pain in left hand: Secondary | ICD-10-CM | POA: Diagnosis not present

## 2020-08-28 DIAGNOSIS — M79641 Pain in right hand: Secondary | ICD-10-CM | POA: Diagnosis not present

## 2020-08-28 DIAGNOSIS — Z8739 Personal history of other diseases of the musculoskeletal system and connective tissue: Secondary | ICD-10-CM | POA: Diagnosis not present

## 2020-08-28 DIAGNOSIS — M7989 Other specified soft tissue disorders: Secondary | ICD-10-CM | POA: Diagnosis not present

## 2020-09-05 ENCOUNTER — Other Ambulatory Visit: Payer: Self-pay | Admitting: Cardiology

## 2020-09-08 ENCOUNTER — Other Ambulatory Visit: Payer: Self-pay | Admitting: Cardiology

## 2020-09-18 DIAGNOSIS — C50911 Malignant neoplasm of unspecified site of right female breast: Secondary | ICD-10-CM | POA: Diagnosis not present

## 2020-09-18 DIAGNOSIS — I739 Peripheral vascular disease, unspecified: Secondary | ICD-10-CM | POA: Diagnosis not present

## 2020-09-18 DIAGNOSIS — E46 Unspecified protein-calorie malnutrition: Secondary | ICD-10-CM | POA: Diagnosis not present

## 2020-09-18 DIAGNOSIS — E261 Secondary hyperaldosteronism: Secondary | ICD-10-CM | POA: Diagnosis not present

## 2020-09-18 DIAGNOSIS — I701 Atherosclerosis of renal artery: Secondary | ICD-10-CM | POA: Diagnosis not present

## 2020-09-18 DIAGNOSIS — D692 Other nonthrombocytopenic purpura: Secondary | ICD-10-CM | POA: Diagnosis not present

## 2020-09-18 DIAGNOSIS — I130 Hypertensive heart and chronic kidney disease with heart failure and stage 1 through stage 4 chronic kidney disease, or unspecified chronic kidney disease: Secondary | ICD-10-CM | POA: Diagnosis not present

## 2020-09-18 DIAGNOSIS — N184 Chronic kidney disease, stage 4 (severe): Secondary | ICD-10-CM | POA: Diagnosis not present

## 2020-09-18 DIAGNOSIS — D6869 Other thrombophilia: Secondary | ICD-10-CM | POA: Diagnosis not present

## 2020-09-18 DIAGNOSIS — I5032 Chronic diastolic (congestive) heart failure: Secondary | ICD-10-CM | POA: Diagnosis not present

## 2020-09-18 DIAGNOSIS — I4891 Unspecified atrial fibrillation: Secondary | ICD-10-CM | POA: Diagnosis not present

## 2020-09-18 DIAGNOSIS — D61818 Other pancytopenia: Secondary | ICD-10-CM | POA: Diagnosis not present

## 2020-09-19 ENCOUNTER — Telehealth: Payer: Self-pay

## 2020-09-19 NOTE — Telephone Encounter (Signed)
A nurse named Sadie called from [facility] to give report on patient.  Says there was a recent in home visit and patient reported swelling in her hands.  Pt also had a heart rate of 50 yesterday but pt says she has a history of heart rate being low. Pt also reported SOB with activity.  Can contact Sadie at [phone]

## 2020-09-19 NOTE — Telephone Encounter (Signed)
Spoke with patient, she said she did have some swelling in her hand but it has gone down completely.  She said her pulse has been low but she is running about the same as she always does.  She also said shortness of breath is normal and no worse.  She said she feels these symptoms have been normal for her since being on the Tenoretic.  She has a four month follow up appointment with you on 10/17/20.

## 2020-09-23 ENCOUNTER — Other Ambulatory Visit: Payer: Self-pay | Admitting: Family Medicine

## 2020-09-27 ENCOUNTER — Inpatient Hospital Stay (HOSPITAL_COMMUNITY): Payer: PPO | Attending: Hematology

## 2020-09-27 ENCOUNTER — Other Ambulatory Visit: Payer: Self-pay

## 2020-09-27 DIAGNOSIS — Z801 Family history of malignant neoplasm of trachea, bronchus and lung: Secondary | ICD-10-CM | POA: Insufficient documentation

## 2020-09-27 DIAGNOSIS — C50511 Malignant neoplasm of lower-outer quadrant of right female breast: Secondary | ICD-10-CM | POA: Diagnosis not present

## 2020-09-27 DIAGNOSIS — I10 Essential (primary) hypertension: Secondary | ICD-10-CM | POA: Diagnosis not present

## 2020-09-27 DIAGNOSIS — Z8249 Family history of ischemic heart disease and other diseases of the circulatory system: Secondary | ICD-10-CM | POA: Insufficient documentation

## 2020-09-27 DIAGNOSIS — E785 Hyperlipidemia, unspecified: Secondary | ICD-10-CM | POA: Diagnosis not present

## 2020-09-27 DIAGNOSIS — Z79899 Other long term (current) drug therapy: Secondary | ICD-10-CM | POA: Diagnosis not present

## 2020-09-27 DIAGNOSIS — E039 Hypothyroidism, unspecified: Secondary | ICD-10-CM | POA: Insufficient documentation

## 2020-09-27 DIAGNOSIS — D61818 Other pancytopenia: Secondary | ICD-10-CM | POA: Insufficient documentation

## 2020-09-27 DIAGNOSIS — I4891 Unspecified atrial fibrillation: Secondary | ICD-10-CM | POA: Diagnosis not present

## 2020-09-27 DIAGNOSIS — N183 Chronic kidney disease, stage 3 unspecified: Secondary | ICD-10-CM | POA: Insufficient documentation

## 2020-09-27 DIAGNOSIS — E860 Dehydration: Secondary | ICD-10-CM | POA: Insufficient documentation

## 2020-09-27 DIAGNOSIS — Z170 Estrogen receptor positive status [ER+]: Secondary | ICD-10-CM | POA: Diagnosis not present

## 2020-09-27 DIAGNOSIS — Z8349 Family history of other endocrine, nutritional and metabolic diseases: Secondary | ICD-10-CM | POA: Diagnosis not present

## 2020-09-27 DIAGNOSIS — J45909 Unspecified asthma, uncomplicated: Secondary | ICD-10-CM | POA: Insufficient documentation

## 2020-09-27 LAB — CBC WITH DIFFERENTIAL/PLATELET
Abs Immature Granulocytes: 0.01 10*3/uL (ref 0.00–0.07)
Basophils Absolute: 0 10*3/uL (ref 0.0–0.1)
Basophils Relative: 0 %
Eosinophils Absolute: 0 10*3/uL (ref 0.0–0.5)
Eosinophils Relative: 0 %
HCT: 32.5 % — ABNORMAL LOW (ref 36.0–46.0)
Hemoglobin: 10.8 g/dL — ABNORMAL LOW (ref 12.0–15.0)
Immature Granulocytes: 0 %
Lymphocytes Relative: 22 %
Lymphs Abs: 0.6 10*3/uL — ABNORMAL LOW (ref 0.7–4.0)
MCH: 29.7 pg (ref 26.0–34.0)
MCHC: 33.2 g/dL (ref 30.0–36.0)
MCV: 89.3 fL (ref 80.0–100.0)
Monocytes Absolute: 0.2 10*3/uL (ref 0.1–1.0)
Monocytes Relative: 6 %
Neutro Abs: 2 10*3/uL (ref 1.7–7.7)
Neutrophils Relative %: 72 %
Platelets: 124 10*3/uL — ABNORMAL LOW (ref 150–400)
RBC: 3.64 MIL/uL — ABNORMAL LOW (ref 3.87–5.11)
RDW: 14.2 % (ref 11.5–15.5)
WBC: 2.9 10*3/uL — ABNORMAL LOW (ref 4.0–10.5)
nRBC: 0 % (ref 0.0–0.2)

## 2020-09-27 LAB — COMPREHENSIVE METABOLIC PANEL
ALT: 16 U/L (ref 0–44)
AST: 24 U/L (ref 15–41)
Albumin: 4 g/dL (ref 3.5–5.0)
Alkaline Phosphatase: 51 U/L (ref 38–126)
Anion gap: 7 (ref 5–15)
BUN: 30 mg/dL — ABNORMAL HIGH (ref 8–23)
CO2: 29 mmol/L (ref 22–32)
Calcium: 9.3 mg/dL (ref 8.9–10.3)
Chloride: 95 mmol/L — ABNORMAL LOW (ref 98–111)
Creatinine, Ser: 1.67 mg/dL — ABNORMAL HIGH (ref 0.44–1.00)
GFR, Estimated: 30 mL/min — ABNORMAL LOW (ref >60)
Glucose, Bld: 134 mg/dL — ABNORMAL HIGH (ref 70–99)
Potassium: 3.5 mmol/L (ref 3.5–5.1)
Sodium: 131 mmol/L — ABNORMAL LOW (ref 135–145)
Total Bilirubin: 0.7 mg/dL (ref 0.3–1.2)
Total Protein: 7.5 g/dL (ref 6.5–8.1)

## 2020-09-27 LAB — IRON AND TIBC
Iron: 36 ug/dL (ref 28–170)
Saturation Ratios: 14 % (ref 10.4–31.8)
TIBC: 262 ug/dL (ref 250–450)
UIBC: 226 ug/dL

## 2020-09-27 LAB — FERRITIN: Ferritin: 496 ng/mL — ABNORMAL HIGH (ref 11–307)

## 2020-09-27 LAB — VITAMIN D 25 HYDROXY (VIT D DEFICIENCY, FRACTURES): Vit D, 25-Hydroxy: 89.58 ng/mL (ref 30–100)

## 2020-09-27 LAB — LACTATE DEHYDROGENASE: LDH: 135 U/L (ref 98–192)

## 2020-09-27 LAB — VITAMIN B12: Vitamin B-12: 534 pg/mL (ref 180–914)

## 2020-10-02 ENCOUNTER — Inpatient Hospital Stay (HOSPITAL_COMMUNITY): Payer: PPO

## 2020-10-03 ENCOUNTER — Other Ambulatory Visit: Payer: Self-pay | Admitting: Family Medicine

## 2020-10-03 DIAGNOSIS — I10 Essential (primary) hypertension: Secondary | ICD-10-CM

## 2020-10-04 ENCOUNTER — Inpatient Hospital Stay (HOSPITAL_BASED_OUTPATIENT_CLINIC_OR_DEPARTMENT_OTHER): Payer: PPO | Admitting: Oncology

## 2020-10-04 DIAGNOSIS — Z170 Estrogen receptor positive status [ER+]: Secondary | ICD-10-CM | POA: Diagnosis not present

## 2020-10-04 DIAGNOSIS — C50511 Malignant neoplasm of lower-outer quadrant of right female breast: Secondary | ICD-10-CM | POA: Diagnosis not present

## 2020-10-04 NOTE — Progress Notes (Signed)
[place], [place] [address]   CLINIC:  Medical Oncology/Hematology  PCP:  [name], DO [place] [address] [phone]   REASON FOR VISIT: Follow-up for breast cancer  I connected with [name] on 10/04/20 at 10:30 AM EST by telephone visit and verified that I am speaking with the correct person using two identifiers.   I discussed the limitations, risks, security and privacy concerns of performing an evaluation and management service by telemedicine and the availability of in-person appointments. I also discussed with the patient that there may be a patient responsible charge related to this service. The patient expressed understanding and agreed to proceed.   Other persons participating in the visit and their role in the encounter: None  Patient's location: Home  Provider's location: Clinic      CURRENT THERAPY: Letrozole.  BRIEF ONCOLOGIC HISTORY:  Oncology History Overview Note  Cancer Staging Malignant neoplasm of lower-outer quadrant of right breast of female, estrogen receptor positive ([place]) Staging form: Breast, AJCC 8th Edition - Clinical stage from 02/22/2018: Stage IA (cT1c, cN0, cM0, G1, ER+, PR+, HER2-) - Signed by Truitt Merle, MD on 03/02/2018    Malignant neoplasm of lower-outer quadrant of right breast of female, estrogen receptor positive ([place])  02/12/2018 Mammogram   She had routine screening bilateral mammography on 02/12/2018 at [facility] with results showing: indeterminate irregular mass in the right breast.    02/16/2018 Mammogram   She underwent right diagnostic mammography with tomography and right breast ultrasonography at [facility] on 02/16/2018 showing: 1.1 cm irregular mass at the 8 O'clock position on the right breast   02/22/2018 Pathology Results   Diagnosis 1. Breast, right, needle core biopsy, lateral, 8 o'clock - INVASIVE AND IN SITU DUCTAL CARCINOMA. - SEE COMMENT. 2. Breast, right,  needle core biopsy, 11 o'clock - FIBROCYSTIC CHANGES. - USUAL DUCTAL HYPERPLASIA. - THERE IS NO EVIDENCE OF MALIGNANCY.   02/22/2018 Receptors her2   IMMUNOHISTOCHEMICAL AND MORPHOMETRIC ANALYSIS PERFORMED MANUALLY Estrogen Receptor: 100%, POSITIVE, STRONG STAINING INTENSITY Progesterone Receptor: 40%, POSITIVE, MODERATE STAINING INTENSITY Proliferation Marker Ki67: 1% Her2 Negative   02/22/2018 Cancer Staging   Staging form: Breast, AJCC 8th Edition - Clinical stage from 02/22/2018: Stage IA (cT1c, cN0, cM0, G1, ER+, PR+, HER2-) - Signed by Truitt Merle, MD on 03/02/2018    02/25/2018 Initial Diagnosis   Malignant neoplasm of lower-outer quadrant of right breast of female, estrogen receptor positive ([place])     CANCER STAGING: Cancer Staging Malignant neoplasm of lower-outer quadrant of right breast of female, estrogen receptor positive ([place]) Staging form: Breast, AJCC 8th Edition - Clinical stage from 02/22/2018: Stage IA (cT1c, cN0, cM0, G1, ER+, PR+, HER2-) - Signed by Truitt Merle, MD on 03/02/2018    INTERVAL HISTORY:  Ms. Dinger 87 y.o. female returns for routine follow-up for breast cancer.  She was last seen in clinic on 04/04/20.   In the interim, she was evaluated in the ED on 2020-06-03 for back pain.  Work-up was essentially negative.  She was discharged with oral narcotics and instructed to rest and apply heat as needed.  She had a mammogram on 2020-03-20 which was negative for malignancy.  Recommended annual follow-up.  She will be due for repeat in mammogram in August 2022.  Today she reports feeling well.  She denies any new lumps or bumps present.  She denies any new bone pain. Denies any nausea, vomiting, or diarrhea. Denies any new pains.  Had not noticed any recent bleeding such as epistaxis, hematuria or hematochezia. Denies recent chest pain on exertion, shortness of breath on minimal exertion, pre-syncopal episodes, or palpitations. Denies any numbness or tingling in hands or  feet. Denies any recent fevers, infections, or recent hospitalizations. Patient reports appetite at 100% and energy level at 100%.  She is eating well maintaining her weight this time.   REVIEW OF SYSTEMS:  Review of Systems  Gastrointestinal: Positive for constipation.  All other systems reviewed and are negative.    PAST MEDICAL/SURGICAL HISTORY:  Past Medical History:  Diagnosis Date  . Anemia   . Arthritis   . Asthma   . Atrial fibrillation ([place])   . Basal cell carcinoma 02/06/2009   Left ear targus- ([facility])  . Basal cell carcinoma 09/28/2008   Right back-(CX35FU)  . CKD (chronic kidney disease), stage III ([place])   . Heart murmur   . Hyperlipidemia   . Hypertension   . Hypothyroidism   . Nodule of right lung    Right upper lobe  . PONV (postoperative nausea and vomiting)   . Renal insufficiency    Chronic  . Renal vascular disease   . Right renal artery stenosis ([place]) 05/09/2015  . Squamous cell carcinoma of skin 09/07/2019   KA-Left shin-txpbx  . Vertigo    Past Surgical History:  Procedure Laterality Date  . BREAST LUMPECTOMY WITH RADIOACTIVE SEED LOCALIZATION Right 04/08/2018   Procedure: BREAST LUMPECTOMY WITH RADIOACTIVE SEED LOCALIZATION;  Surgeon: Rolm Bookbinder, MD;  Location: [facility];  Service: General;  Laterality: Right;  . IR GENERIC HISTORICAL  08/29/2016   IR US GUIDE VASC ACCESS RIGHT 08/29/2016 [facility]INTER[facility]  . IR GENERIC HISTORICAL  08/29/2016   IR RENAL BILAT S&I MOD SED 08/29/2016 [facility]  . KNEE ARTHROSCOPY     2019  . THYROIDECTOMY, PARTIAL Right 1981   middle lobe removed 1st, then right lobe removed 7-8 years later (approx. 1988)     SOCIAL HISTORY:  Social History   Socioeconomic History  . Marital status: Widowed    Spouse name: Not on file  . Number of children: 2  . Years of education: 62  . Highest education level: Not on file  Occupational History  . Occupation: Retired  Tobacco Use  . Smoking status: Never Smoker  .  Smokeless tobacco: Never Used  . Tobacco comment: Pt denies cigarettes  Vaping Use  . Vaping Use: Never used  Substance and Sexual Activity  . Alcohol use: No  . Drug use: No  . Sexual activity: Never  Other Topics Concern  . Not on file  Social History Narrative   Lives alone   Ambidextrous (writes with left hand).   No caffeine use.   Social Determinants of Health   Financial Resource Strain: Not on file  Food Insecurity: Not on file  Transportation Needs: Not on file  Physical Activity: Not on file  Stress: Not on file  Social Connections: Not on file  Intimate Partner Violence: Not on file    FAMILY HISTORY:  Family History  Problem Relation Age of Onset  . Thyroid disease Mother   . Stroke Father   . Hypertension Brother   . Lung cancer Maternal Uncle   . Cancer Maternal Grandmother        Liver  . Lung cancer Maternal Uncle   . Hypertension Daughter   . Hypercholesterolemia Daughter   . Hypercholesterolemia Daughter     CURRENT MEDICATIONS:  Outpatient Encounter Medications  as of 10/04/2020  Medication Sig Note  . acetaminophen (TYLENOL) 325 MG tablet Take 325 mg by mouth daily as needed for moderate pain or headache.   Marland Kitchen atenolol-chlorthalidone (TENORETIC) 50-25 MG tablet TAKE 1 TABLET BY MOUTH EVERY DAY   . atorvastatin (LIPITOR) 20 MG tablet TAKE 1/2 TABLET BY MOUTH EVERY DAY   . calcium-vitamin D (OSCAL WITH D) 500-200 MG-UNIT per tablet Take 2 tablets by mouth at bedtime.    . cholecalciferol (VITAMIN D) 1000 units tablet Take 1,000 Units by mouth daily.   Marland Kitchen doxycycline (VIBRA-TABS) 100 MG tablet Take 1 tablet (100 mg total) by mouth 2 (two) times daily. 1 po bid   . ferrous sulfate 325 (65 FE) MG tablet Take 325 mg by mouth 2 (two) times daily with a meal.   . flecainide (TAMBOCOR) 50 MG tablet TAKE 1 TABLET BY MOUTH TWICE A DAY   . fluticasone (FLONASE) 50 MCG/ACT nasal spray Place 1 spray into both nostrils 2 (two) times daily.   . hydrALAZINE  (APRESOLINE) 100 MG tablet Take 100 mg by mouth 2 (two) times daily.   Marland Kitchen HYDROcodone-acetaminophen (NORCO/VICODIN) 5-325 MG tablet Take 1 tablet by mouth every 6 (six) hours as needed for severe pain.   . hydroxypropyl methylcellulose / hypromellose (ISOPTO TEARS / GONIOVISC) 2.5 % ophthalmic solution Place 1 drop into both eyes daily.    . iron polysaccharides (NIFEREX) 150 MG capsule Take 1 capsule (150 mg total) by mouth daily.   Marland Kitchen letrozole (FEMARA) 2.5 MG tablet Take 1 tablet (2.5 mg total) by mouth daily.   Marland Kitchen levothyroxine (SYNTHROID) 75 MCG tablet TAKE 1 TABLET BY MOUTH EVERY DAY   . lisinopril (PRINIVIL,ZESTRIL) 40 MG tablet Take 1 tablet by mouth daily. 02/08/2019: Blood  pressure was low.   Facility-Administered Encounter Medications as of 10/04/2020  Medication  . methylPREDNISolone acetate (DEPO-MEDROL) injection 40 mg    ALLERGIES:  Allergies  Allergen Reactions  . Amlodipine Other (See Comments)    edema  . Isosorbide Nitrate Nausea Only  . Sulfonamide Derivatives Nausea Only and Rash  . Terazosin Hives and Nausea Only     PHYSICAL EXAM:  ECOG Performance status: 1  There were no vitals filed for this visit. There were no vitals filed for this visit.  -Limited secondary to telephone visit. -She is in no acute distress speaking in full complete sentences.  LABORATORY DATA:  I have reviewed the labs as listed.  CBC    Component Value Date/Time   WBC 2.9 (L) 09/27/2020 1411   RBC 3.64 (L) 09/27/2020 1411   HGB 10.8 (L) 09/27/2020 1411   HGB 10.9 (L) 06/19/2020 1051   HCT 32.5 (L) 09/27/2020 1411   HCT 33.7 (L) 06/19/2020 1051   PLT 124 (L) 09/27/2020 1411   PLT 115 (L) 06/19/2020 1051   MCV 89.3 09/27/2020 1411   MCV 89 06/19/2020 1051   MCH 29.7 09/27/2020 1411   MCHC 33.2 09/27/2020 1411   RDW 14.2 09/27/2020 1411   RDW 12.9 06/19/2020 1051   LYMPHSABS 0.6 (L) 09/27/2020 1411   LYMPHSABS 0.8 06/19/2020 1051   MONOABS 0.2 09/27/2020 1411   EOSABS 0.0  09/27/2020 1411   EOSABS 0.0 06/19/2020 1051   BASOSABS 0.0 09/27/2020 1411   BASOSABS 0.0 06/19/2020 1051   CMP Latest Ref Rng & Units 09/27/2020 06/19/2020 06/03/2020  Glucose 70 - 99 mg/dL 134(H) 110(H) 119(H)  BUN 8 - 23 mg/dL 30(H) 26 26(H)  Creatinine 0.44 - 1.00 mg/dL 1.67(H) 1.47(H)  1.40(H)  Sodium 135 - 145 mmol/L 131(L) 137 132(L)  Potassium 3.5 - 5.1 mmol/L 3.5 4.2 3.6  Chloride 98 - 111 mmol/L 95(L) 96 93(L)  CO2 22 - 32 mmol/L 29 30(H) 28  Calcium 8.9 - 10.3 mg/dL 9.3 9.5 9.4  Total Protein 6.5 - 8.1 g/dL 7.5 - -  Total Bilirubin 0.3 - 1.2 mg/dL 0.7 - -  Alkaline Phos 38 - 126 U/L 51 - -  AST 15 - 41 U/L 24 - -  ALT 0 - 44 U/L 16 - -    All questions were answered to patient's stated satisfaction. Encouraged patient to call with any new concerns or questions before his next visit to the cancer center and we can certain see him sooner, if needed.     ASSESSMENT & PLAN:  1. Stage I right breast cancer: -She had a mammogram on 02/16/2018 which showed B RADS category 0 incomplete.  She was referred for a biopsy. - Right breast biopsy on 02/22/2018 consistent with IDC. -Right breast lumpectomy on 04/08/2018, IDC, 1.1 cm, grade 1, margins negative, ER/PR positive, HER-2 negative, Ki-67 1% -Letrozole started on 05/28/2018.  She is tolerating this well. -She declined adjuvant RT. -Mammogram from 03/20/2020 was negative for malignancy.  Recommended annual follow-up.  We will repeat in August 2022.  She would like her mammograms done at [facility] completed on 09/27/2020 show a white count of 2.9, hemoglobin 10.8 and a platelet count of 124.  Slight bump in her creatinine at 1.67 and decline in her sodium level at 131. -We will repeat labs in 6 months.  2.  Pancytopenia: -Mild thrombocytopenia since 2015.  Leukopenia since 2014. She also has normocytic anemia. - Etiology could be from MDS as well as CKD. - Labs on 09/27/2020 show a white count of 2.9, potassium level 3.5, creatinine  1.67, hemoglobin 10.8 and platelet count of 124.  3.  Health maintenance: -DEXA scan done 03/07/2019 showed a T score of -1.0 -She will continue calcium and vitamin D daily. -We will repeat DEXA scan in July 2022.  Orders placed.  She would like this [facility] at [facility].   4.  Dehydration: -Labs from 09/27/2020 show a sodium level of 131 and a creatinine of 1.67 which is slightly up from previous. -Recommend adequate hydration. -He admits to not drinking enough water.  Disposition: -RTC in 6 months with repeat labs (CBC, CMP, LDH, iron, ferritin, vitamin B12, vitamin D), review results from bone density and mammogram and assessment.   No problem-specific Assessment & Plan notes found for this encounter.   Orders placed this encounter:  Orders Placed This Encounter  Procedures  . MM DIAG BREAST TOMO BILATERAL  . DG [facility] DEXA   I provided 17 minutes of non face-to-face telephone visit time during this encounter, and > 50% was spent counseling as documented under my assessment & plan.  Faythe Casa, NP 10/04/2020 11:02 AM  [place] [phone]

## 2020-10-08 ENCOUNTER — Other Ambulatory Visit: Payer: PPO

## 2020-10-09 ENCOUNTER — Ambulatory Visit (HOSPITAL_COMMUNITY): Payer: PPO | Admitting: Nurse Practitioner

## 2020-10-13 ENCOUNTER — Other Ambulatory Visit: Payer: Self-pay | Admitting: Cardiology

## 2020-10-17 ENCOUNTER — Ambulatory Visit (INDEPENDENT_AMBULATORY_CARE_PROVIDER_SITE_OTHER): Payer: PPO | Admitting: Family Medicine

## 2020-10-17 ENCOUNTER — Other Ambulatory Visit: Payer: Self-pay

## 2020-10-17 VITALS — BP 142/58 | HR 53 | Temp 98.1°F | Ht 66.0 in | Wt 112.0 lb

## 2020-10-17 DIAGNOSIS — Z23 Encounter for immunization: Secondary | ICD-10-CM

## 2020-10-17 DIAGNOSIS — E034 Atrophy of thyroid (acquired): Secondary | ICD-10-CM | POA: Diagnosis not present

## 2020-10-17 DIAGNOSIS — T148XXA Other injury of unspecified body region, initial encounter: Secondary | ICD-10-CM | POA: Diagnosis not present

## 2020-10-17 DIAGNOSIS — I10 Essential (primary) hypertension: Secondary | ICD-10-CM

## 2020-10-17 DIAGNOSIS — N1831 Chronic kidney disease, stage 3a: Secondary | ICD-10-CM

## 2020-10-17 DIAGNOSIS — I480 Paroxysmal atrial fibrillation: Secondary | ICD-10-CM

## 2020-10-17 DIAGNOSIS — N1832 Chronic kidney disease, stage 3b: Secondary | ICD-10-CM | POA: Diagnosis not present

## 2020-10-17 DIAGNOSIS — D631 Anemia in chronic kidney disease: Secondary | ICD-10-CM | POA: Diagnosis not present

## 2020-10-17 MED ORDER — LISINOPRIL 20 MG PO TABS
20.0000 mg | ORAL_TABLET | Freq: Every day | ORAL | 0 refills | Status: DC | PRN
Start: 2020-10-17 — End: 2021-01-08

## 2020-10-17 NOTE — Progress Notes (Signed)
Subjective: CC: hypothyroidism, hematoma PCP: [name], DO [name] B Haertel is a 87 y.o. female presenting to clinic today for:  1. Hypothyroidism Patient reports compliance with her Synthroid.  Denies any issues with tremor, heart palpitations, change in bowel habits  2.  Hematoma Patient reports that she brushed up against a metal fixture 2 weeks ago and sustained a large hematoma along the right forearm.  This is getting better but is still present.  She is had issues with multiple hematomas in the past.  No skin breakdown  3.  Hypertension, CKD Patient reports compliance with her atenolol-chlorthalidone.  She takes flecainide as directed for atrial fibrillation.  Uses lisinopril only as needed.  She is only used once or twice in the last year or so and only takes it if her "blood pressure is high".  No chest pain, dizziness, shortness of breath.   ROS: Per HPI  Allergies  Allergen Reactions  . Amlodipine Other (See Comments)    edema  . Isosorbide Nitrate Nausea Only  . Sulfonamide Derivatives Nausea Only and Rash  . Terazosin Hives and Nausea Only   Past Medical History:  Diagnosis Date  . Anemia   . Arthritis   . Asthma   . Atrial fibrillation ([place])   . Basal cell carcinoma 02/06/2009   Left ear targus- ([facility])  . Basal cell carcinoma 09/28/2008   Right back-(CX35FU)  . CKD (chronic kidney disease), stage III ([place])   . Heart murmur   . Hyperlipidemia   . Hypertension   . Hypothyroidism   . Nodule of right lung    Right upper lobe  . PONV (postoperative nausea and vomiting)   . Renal insufficiency    Chronic  . Renal vascular disease   . Right renal artery stenosis ([place]) 05/09/2015  . Squamous cell carcinoma of skin 09/07/2019   KA-Left shin-txpbx  . Vertigo     Current Outpatient Medications:  .  acetaminophen (TYLENOL) 325 MG tablet, Take 325 mg by mouth daily as needed for moderate pain or headache., Disp: , Rfl:  .  atenolol-chlorthalidone  (TENORETIC) 50-25 MG tablet, TAKE 1 TABLET BY MOUTH EVERY DAY, Disp: 90 tablet, Rfl: 0 .  atorvastatin (LIPITOR) 20 MG tablet, TAKE 1/2 TABLET BY MOUTH EVERY DAY, Disp: 45 tablet, Rfl: 0 .  calcium-vitamin D (OSCAL WITH D) 500-200 MG-UNIT per tablet, Take 2 tablets by mouth at bedtime. , Disp: , Rfl:  .  cholecalciferol (VITAMIN D) 1000 units tablet, Take 1,000 Units by mouth daily., Disp: , Rfl:  .  ferrous sulfate 325 (65 FE) MG tablet, Take 325 mg by mouth 2 (two) times daily with a meal., Disp: , Rfl:  .  flecainide (TAMBOCOR) 50 MG tablet, Take 1 tablet (50 mg total) by mouth 2 (two) times daily. NEED OV., Disp: 30 tablet, Rfl: 4 .  fluticasone (FLONASE) 50 MCG/ACT nasal spray, Place 1 spray into both nostrils 2 (two) times daily., Disp: 16 g, Rfl: 5 .  hydrALAZINE (APRESOLINE) 100 MG tablet, Take 100 mg by mouth 2 (two) times daily., Disp: , Rfl:  .  hydroxypropyl methylcellulose / hypromellose (ISOPTO TEARS / GONIOVISC) 2.5 % ophthalmic solution, Place 1 drop into both eyes daily. , Disp: , Rfl:  .  iron polysaccharides (NIFEREX) 150 MG capsule, Take 1 capsule (150 mg total) by mouth daily., Disp: 30 capsule, Rfl: 12 .  letrozole (FEMARA) 2.5 MG tablet, Take 1 tablet (2.5 mg total) by mouth daily., Disp: 90 tablet, Rfl: 4 .  levothyroxine (SYNTHROID) 75 MCG tablet, TAKE 1 TABLET BY MOUTH EVERY DAY, Disp: 90 tablet, Rfl: 3 .  lisinopril (PRINIVIL,ZESTRIL) 40 MG tablet, Take 1 tablet by mouth daily., Disp: , Rfl:  Social History   Socioeconomic History  . Marital status: Widowed    Spouse name: Not on file  . Number of children: 2  . Years of education: 59  . Highest education level: Not on file  Occupational History  . Occupation: Retired  Tobacco Use  . Smoking status: Never Smoker  . Smokeless tobacco: Never Used  . Tobacco comment: Pt denies cigarettes  Vaping Use  . Vaping Use: Never used  Substance and Sexual Activity  . Alcohol use: No  . Drug use: No  . Sexual activity:  Never  Other Topics Concern  . Not on file  Social History Narrative   Lives alone   Ambidextrous (writes with left hand).   No caffeine use.   Social Determinants of Health   Financial Resource Strain: Not on file  Food Insecurity: Not on file  Transportation Needs: Not on file  Physical Activity: Not on file  Stress: Not on file  Social Connections: Not on file  Intimate Partner Violence: Not on file   Family History  Problem Relation Age of Onset  . Thyroid disease Mother   . Stroke Father   . Hypertension Brother   . Lung cancer Maternal Uncle   . Cancer Maternal Grandmother        Liver  . Lung cancer Maternal Uncle   . Hypertension Daughter   . Hypercholesterolemia Daughter   . Hypercholesterolemia Daughter     Objective: Office vital signs reviewed. BP (!) 142/58   Pulse (!) 53   Temp 98.1 F (36.7 C) (Temporal)   Ht 5\' 6"  (1.676 m)   Wt 112 lb (50.8 kg)   SpO2 97%   BMI 18.08 kg/m   Physical Examination:  General: Awake, alert, well nourished, No acute distress HEENT: Normal, sclera white, MMM Cardio: regular rate and rhythm, S1S2 heard, no murmurs appreciated Pulm: clear to auscultation bilaterally, no wheezes, rhonchi or rales; normal work of breathing on room air Extremities: warm, well perfused, No edema, cyanosis or clubbing; +2 pulses bilaterally MSK: ambulates independently. Right forearm will gumball sized hematoma noted.  She has evidence of healing ecchymosis noted surrounding. Skin: Hyperpigmentation noted along bilateral anterior shins  Assessment/ Plan: 87 y.o. female   Hypothyroidism due to acquired atrophy of thyroid - Plan: Thyroid Panel With TSH  Paroxysmal atrial fibrillation ([place])  Stage 3a chronic kidney disease ([place]) - Plan: Renal Function Panel  Anemia due to stage 3b chronic kidney disease ([facility])  Essential hypertension  Hematoma  Asymptomatic from a thyroid standpoint.  Check thyroid panel  She is rate controlled.   Continue current regimen  Check renal function panel.  Given age would give her a threshold BP of 140/90.  BP is controlled.  I have renewed her lisinopril but at reduced dose for as needed use.  Hematoma does not look complicated.  Continue supportive care.  Handout provided  Shingles vaccine was given today  No orders of the defined types were placed in this encounter.  No orders of the defined types were placed in this encounter.    [name], DO [place] [phone]

## 2020-10-17 NOTE — Patient Instructions (Signed)
Hematoma A hematoma is a collection of blood. A hematoma can happen:  Under the skin.  In an organ.  In a body space.  In a joint space.  In other tissues. The blood can thicken (clot) to form a lump that you can see and feel. The lump is often hard and may become sore and tender. The lump can be very small or very big. Most hematomas get better in a few days to weeks. However, some hematomas may be serious and need medical care. What are the causes? This condition is caused by:  An injury.  Blood that leaks under the skin.  Problems from surgeries.  Medical conditions that cause bleeding or bruising. What increases the risk? You are more likely to develop this condition if:  You are an older adult.  You use medicines that thin your blood. What are the signs or symptoms? Symptoms depend on where the hematoma is in your body.  If the hematoma is under the skin, there is: ? A firm lump on the body. ? Pain and tenderness in the area. ? Bruising. The skin above the lump may be blue, dark blue, purple-red, or yellowish.  If the hematoma is deep in the tissues or body spaces, there may be: ? Blood in the stomach. This may cause pain in the belly (abdomen), weakness, passing out (fainting), and shortness of breath. ? Blood in the head. This may cause a headache, weakness, trouble speaking or understanding speech, or passing out.   How is this diagnosed? This condition is diagnosed based on:  Your medical history.  A physical exam.  Imaging tests, such as ultrasound or CT scan.  Blood tests. How is this treated? Treatment depends on the cause, size, and location of the hematoma. Treatment may include:  Doing nothing. Many hematomas go away on their own without treatment.  Surgery or close monitoring. This may be needed for large hematomas or hematomas that affect the body's organs.  Medicines. These may be given if a medical condition caused the hematoma. Follow  these instructions at home: Managing pain, stiffness, and swelling  If told, put ice on the area. ? Put ice in a plastic bag. ? Place a towel between your skin and the bag. ? Leave the ice on for 20 minutes, 2-3 times a day for the first two days.  If told, put heat on the affected area after putting ice on the area for two days. Use the heat source that your doctor tells you to use. This could be a moist heat pack or a heating pad. To do this: ? Place a towel between your skin and the heat source. ? Leave the heat on for 20-30 minutes. ? Remove the heat if your skin turns bright red. This is very important if you are unable to feel pain, heat, or cold. You may have a greater risk of getting burned.  Raise (elevate) the affected area above the level of your heart while you are sitting or lying down.  Wrap the affected area with an elastic bandage, if told by your doctor. Do not wrap the bandage too tightly.  If your hematoma is on a leg or foot and is painful, your doctor may give you crutches. Use them as told by your doctor.   General instructions  Take over-the-counter and prescription medicines only as told by your doctor.  Keep all follow-up visits as told by your doctor. This is important. Contact a doctor if:  You   have a fever.  The swelling or bruising gets worse.  You start to get more hematomas. Get help right away if:  Your pain gets worse.  Your pain is not getting better with medicine.  Your skin over the hematoma breaks or starts to bleed.  Your hematoma is in your chest or belly and you: ? Pass out. ? Feel weak. ? Become short of breath.  You have a hematoma on your scalp that is caused by a fall or injury, and you: ? Have a headache that gets worse. ? Have trouble speaking or understanding speech. ? Become less alert or you pass out. Summary  A hematoma is a collection of blood in any part of your body.  Most hematomas get better on their own in a  few days to weeks. Some may need medical care.  Follow instructions from your doctor about how to care for your hematoma.  Contact a doctor if the swelling or bruising gets worse, or if you are short of breath. This information is not intended to replace advice given to you by your health care provider. Make sure you discuss any questions you have with your health care provider. Document Revised: 01/07/2018 Document Reviewed: 01/07/2018 Elsevier Patient Education  2021 [employer]vier Inc.  

## 2020-10-17 NOTE — Addendum Note (Signed)
Addended byCarrolyn Leigh on: 10/17/2020 03:08 PM   Modules accepted: Orders

## 2020-10-18 ENCOUNTER — Other Ambulatory Visit: Payer: Self-pay | Admitting: Family Medicine

## 2020-10-18 DIAGNOSIS — E034 Atrophy of thyroid (acquired): Secondary | ICD-10-CM

## 2020-10-18 LAB — RENAL FUNCTION PANEL
Albumin: 4.4 g/dL (ref 3.6–4.6)
BUN/Creatinine Ratio: 19 (ref 12–28)
BUN: 29 mg/dL — ABNORMAL HIGH (ref 8–27)
CO2: 26 mmol/L (ref 20–29)
Calcium: 9.5 mg/dL (ref 8.7–10.3)
Chloride: 92 mmol/L — ABNORMAL LOW (ref 96–106)
Creatinine, Ser: 1.53 mg/dL — ABNORMAL HIGH (ref 0.57–1.00)
Glucose: 111 mg/dL — ABNORMAL HIGH (ref 65–99)
Phosphorus: 4.5 mg/dL — ABNORMAL HIGH (ref 3.0–4.3)
Potassium: 4.2 mmol/L (ref 3.5–5.2)
Sodium: 134 mmol/L (ref 134–144)
eGFR: 33 mL/min/{1.73_m2} — ABNORMAL LOW (ref >59)

## 2020-10-18 LAB — THYROID PANEL WITH TSH
Free Thyroxine Index: 3.7 (ref 1.2–4.9)
T3 Uptake Ratio: 33 % (ref 24–39)
T4, Total: 11.1 ug/dL (ref 4.5–12.0)
TSH: 2.32 u[IU]/mL (ref 0.450–4.500)

## 2020-10-18 MED ORDER — LEVOTHYROXINE SODIUM 75 MCG PO TABS: 75.0000 ug | ORAL_TABLET | Freq: Every day | ORAL | 3 refills | Status: DC

## 2020-10-23 ENCOUNTER — Other Ambulatory Visit (HOSPITAL_COMMUNITY): Payer: Self-pay | Admitting: Hematology

## 2020-10-23 ENCOUNTER — Other Ambulatory Visit: Payer: Self-pay | Admitting: Cardiology

## 2020-12-06 ENCOUNTER — Other Ambulatory Visit: Payer: Self-pay | Admitting: Cardiology

## 2020-12-11 ENCOUNTER — Other Ambulatory Visit: Payer: Self-pay

## 2020-12-11 ENCOUNTER — Other Ambulatory Visit: Payer: PPO

## 2020-12-11 DIAGNOSIS — N183 Chronic kidney disease, stage 3 unspecified: Secondary | ICD-10-CM | POA: Diagnosis not present

## 2020-12-12 ENCOUNTER — Ambulatory Visit (INDEPENDENT_AMBULATORY_CARE_PROVIDER_SITE_OTHER): Payer: PPO | Admitting: Physician Assistant

## 2020-12-12 ENCOUNTER — Encounter: Payer: Self-pay | Admitting: Physician Assistant

## 2020-12-12 DIAGNOSIS — Z1283 Encounter for screening for malignant neoplasm of skin: Secondary | ICD-10-CM

## 2020-12-12 DIAGNOSIS — Z85828 Personal history of other malignant neoplasm of skin: Secondary | ICD-10-CM

## 2020-12-12 DIAGNOSIS — L570 Actinic keratosis: Secondary | ICD-10-CM | POA: Diagnosis not present

## 2020-12-13 DIAGNOSIS — Z9889 Other specified postprocedural states: Secondary | ICD-10-CM | POA: Insufficient documentation

## 2020-12-13 DIAGNOSIS — M1712 Unilateral primary osteoarthritis, left knee: Secondary | ICD-10-CM | POA: Diagnosis not present

## 2020-12-13 DIAGNOSIS — M179 Osteoarthritis of knee, unspecified: Secondary | ICD-10-CM | POA: Insufficient documentation

## 2020-12-13 DIAGNOSIS — M170 Bilateral primary osteoarthritis of knee: Secondary | ICD-10-CM | POA: Diagnosis not present

## 2020-12-13 DIAGNOSIS — M25562 Pain in left knee: Secondary | ICD-10-CM | POA: Insufficient documentation

## 2020-12-13 DIAGNOSIS — M1711 Unilateral primary osteoarthritis, right knee: Secondary | ICD-10-CM | POA: Diagnosis not present

## 2020-12-13 DIAGNOSIS — M1710 Unilateral primary osteoarthritis, unspecified knee: Secondary | ICD-10-CM | POA: Insufficient documentation

## 2020-12-18 DIAGNOSIS — I129 Hypertensive chronic kidney disease with stage 1 through stage 4 chronic kidney disease, or unspecified chronic kidney disease: Secondary | ICD-10-CM | POA: Diagnosis not present

## 2020-12-18 DIAGNOSIS — N183 Chronic kidney disease, stage 3 unspecified: Secondary | ICD-10-CM | POA: Diagnosis not present

## 2020-12-18 DIAGNOSIS — D649 Anemia, unspecified: Secondary | ICD-10-CM | POA: Diagnosis not present

## 2020-12-18 DIAGNOSIS — N2581 Secondary hyperparathyroidism of renal origin: Secondary | ICD-10-CM | POA: Diagnosis not present

## 2020-12-22 ENCOUNTER — Other Ambulatory Visit: Payer: Self-pay | Admitting: Family Medicine

## 2020-12-27 ENCOUNTER — Ambulatory Visit (INDEPENDENT_AMBULATORY_CARE_PROVIDER_SITE_OTHER): Payer: PPO | Admitting: Family Medicine

## 2020-12-27 ENCOUNTER — Encounter: Payer: Self-pay | Admitting: Family Medicine

## 2020-12-27 ENCOUNTER — Other Ambulatory Visit: Payer: Self-pay

## 2020-12-27 VITALS — BP 187/65 | HR 60 | Temp 98.0°F | Ht 66.0 in | Wt 113.2 lb

## 2020-12-27 DIAGNOSIS — S30861A Insect bite (nonvenomous) of abdominal wall, initial encounter: Secondary | ICD-10-CM

## 2020-12-27 DIAGNOSIS — I10 Essential (primary) hypertension: Secondary | ICD-10-CM | POA: Diagnosis not present

## 2020-12-27 DIAGNOSIS — W57XXXA Bitten or stung by nonvenomous insect and other nonvenomous arthropods, initial encounter: Secondary | ICD-10-CM | POA: Diagnosis not present

## 2020-12-27 MED ORDER — DOXYCYCLINE HYCLATE 100 MG PO TABS: 100.0000 mg | ORAL_TABLET | Freq: Two times a day (BID) | ORAL | 0 refills | Status: AC

## 2020-12-27 NOTE — Progress Notes (Signed)
Acute Office Visit  Subjective:    Patient ID: [name], female    DOB: 07-20-1934, 87 y.o.   MRN: [id number]  Chief Complaint  Patient presents with  . Tick Removal    HPI Patient is in today for a tick bite. She work up this morning with her left side itching. She noticed that she had a tick attached there. Her daughter tried to get the tick off with tweezers. They struggled to get the tick off, they ended up breaking the tick into multiple small pieces. She isn't sure how long the tick was attached. She denies rash, fever, nausea, vomiting, body aches, or fatigue.   She reports that her blood pressure is usually well controlled and is managed by her nephrologist. She has not taken her blood pressure medication yet this morning as she left in a hurry to make it to the appointment in time. She denies focal weakness, shortness of breath, chest pain, or edema.   Past Medical History:  Diagnosis Date  . Anemia   . Arthritis   . Asthma   . Atrial fibrillation ([place])   . Basal cell carcinoma 02/06/2009   Left ear targus- ([facility])  . Basal cell carcinoma 09/28/2008   Right back-(CX35FU)  . CKD (chronic kidney disease), stage III ([place])   . Heart murmur   . Hyperlipidemia   . Hypertension   . Hypothyroidism   . Nodule of right lung    Right upper lobe  . PONV (postoperative nausea and vomiting)   . Renal insufficiency    Chronic  . Renal vascular disease   . Right renal artery stenosis ([place]) 05/09/2015  . Squamous cell carcinoma of skin 09/07/2019   KA-Left shin-txpbx  . Vertigo     Past Surgical History:  Procedure Laterality Date  . BREAST LUMPECTOMY WITH RADIOACTIVE SEED LOCALIZATION Right 04/08/2018   Procedure: BREAST LUMPECTOMY WITH RADIOACTIVE SEED LOCALIZATION;  Surgeon: Rolm Bookbinder, MD;  Location: [facility];  Service: General;  Laterality: Right;  . IR GENERIC HISTORICAL  08/29/2016   IR US GUIDE VASC ACCESS RIGHT 08/29/2016 [facility]  . IR GENERIC  HISTORICAL  08/29/2016   IR RENAL BILAT S&I MOD SED 08/29/2016 [facility]  . KNEE ARTHROSCOPY     2019  . THYROIDECTOMY, PARTIAL Right 1981   middle lobe removed 1st, then right lobe removed 7-8 years later (approx. 1988)    Family History  Problem Relation Age of Onset  . Thyroid disease Mother   . Stroke Father   . Hypertension Brother   . Lung cancer Maternal Uncle   . Cancer Maternal Grandmother        Liver  . Lung cancer Maternal Uncle   . Hypertension Daughter   . Hypercholesterolemia Daughter   . Hypercholesterolemia Daughter     Social History   Socioeconomic History  . Marital status: Widowed    Spouse name: Not on file  . Number of children: 2  . Years of education: 56  . Highest education level: Not on file  Occupational History  . Occupation: Retired  Tobacco Use  . Smoking status: Never Smoker  . Smokeless tobacco: Never Used  . Tobacco comment: Pt denies cigarettes  Vaping Use  . Vaping Use: Never used  Substance and Sexual Activity  . Alcohol use: No  . Drug use: No  . Sexual activity: Never  Other Topics Concern  . Not on file  Social History Narrative   Lives alone  Ambidextrous (writes with left hand).   No caffeine use.   Social Determinants of Health   Financial Resource Strain: Not on file  Food Insecurity: Not on file  Transportation Needs: Not on file  Physical Activity: Not on file  Stress: Not on file  Social Connections: Not on file  Intimate Partner Violence: Not on file    Outpatient Medications Prior to Visit  Medication Sig Dispense Refill  . acetaminophen (TYLENOL) 325 MG tablet Take 325 mg by mouth daily as needed for moderate pain or headache.    Marland Kitchen atenolol-chlorthalidone (TENORETIC) 50-25 MG tablet TAKE 1 TABLET BY MOUTH EVERY DAY 90 tablet 0  . atorvastatin (LIPITOR) 20 MG tablet TAKE 1/2 TABLET BY MOUTH EVERY DAY 45 tablet 0  . calcium-vitamin D (OSCAL WITH D) 500-200 MG-UNIT per tablet Take 2 tablets by mouth at  bedtime.    . cholecalciferol (VITAMIN D) 1000 units tablet Take 1,000 Units by mouth daily.    . ferrous sulfate 325 (65 FE) MG tablet Take 325 mg by mouth 2 (two) times daily with a meal.    . flecainide (TAMBOCOR) 50 MG tablet TAKE 1 TABLET (50 MG TOTAL) BY MOUTH 2 (TWO) TIMES DAILY. NEED OV. 90 tablet 1  . fluticasone (FLONASE) 50 MCG/ACT nasal spray Place 1 spray into both nostrils 2 (two) times daily. 16 g 5  . hydrALAZINE (APRESOLINE) 100 MG tablet Take 100 mg by mouth 2 (two) times daily.    . hydroxypropyl methylcellulose / hypromellose (ISOPTO TEARS / GONIOVISC) 2.5 % ophthalmic solution Place 1 drop into both eyes daily.    Marland Kitchen letrozole (FEMARA) 2.5 MG tablet TAKE 1 TABLET BY MOUTH EVERY DAY 90 tablet 4  . levothyroxine (SYNTHROID) 75 MCG tablet Take 1 tablet (75 mcg total) by mouth daily. 90 tablet 3  . lisinopril (ZESTRIL) 20 MG tablet Take 1 tablet (20 mg total) by mouth daily as needed (Blood pressure above 150/90). (Patient taking differently: Take 10 mg by mouth daily as needed (Blood pressure above 150/90).) 90 tablet 0   No facility-administered medications prior to visit.    Allergies  Allergen Reactions  . Amlodipine Other (See Comments)    edema  . Isosorbide Nitrate Nausea Only  . Sulfonamide Derivatives Nausea Only and Rash  . Terazosin Hives and Nausea Only    Review of Systems As per HPI.     Objective:    Physical Exam Vitals and nursing note reviewed.  Constitutional:      General: She is not in acute distress.    Appearance: Normal appearance. She is not ill-appearing, toxic-appearing or diaphoretic.  HENT:     Head: Normocephalic and atraumatic.  Eyes:     Pupils: Pupils are equal, round, and reactive to light.  Cardiovascular:     Rate and Rhythm: Normal rate and regular rhythm.     Heart sounds: Normal heart sounds. No murmur heard.   Pulmonary:     Effort: Pulmonary effort is normal.     Breath sounds: Normal breath sounds. No wheezing,  rhonchi or rales.  Abdominal:    Musculoskeletal:     Right lower leg: No edema.     Left lower leg: No edema.  Skin:    General: Skin is warm and dry.  Neurological:     General: No focal deficit present.     Mental Status: She is alert and oriented to person, place, and time.  Psychiatric:        Mood and Affect:  Mood normal.        Behavior: Behavior normal.     BP (!) 187/65   Pulse 60   Temp 98 F (36.7 C) (Temporal)   Ht '5\' 6"'  (1.676 m)   Wt 113 lb 4 oz (51.4 kg)   BMI 18.28 kg/m  Wt Readings from Last 3 Encounters:  12/27/20 113 lb 4 oz (51.4 kg)  10/17/20 112 lb (50.8 kg)  06/19/20 115 lb (52.2 kg)    Health Maintenance Due  Topic Date Due  . COVID-19 Vaccine (3 - Moderna risk 4-dose series) 11/09/2019    There are no preventive care reminders to display for this patient.   Lab Results  Component Value Date   TSH 2.320 10/17/2020   Lab Results  Component Value Date   WBC 2.9 (L) 09/27/2020   HGB 10.8 (L) 09/27/2020   HCT 32.5 (L) 09/27/2020   MCV 89.3 09/27/2020   PLT 124 (L) 09/27/2020   Lab Results  Component Value Date   NA 134 10/17/2020   K 4.2 10/17/2020   CO2 26 10/17/2020   GLUCOSE 111 (H) 10/17/2020   BUN 29 (H) 10/17/2020   CREATININE 1.53 (H) 10/17/2020   BILITOT 0.7 09/27/2020   ALKPHOS 51 09/27/2020   AST 24 09/27/2020   ALT 16 09/27/2020   PROT 7.5 09/27/2020   ALBUMIN 4.4 10/17/2020   CALCIUM 9.5 10/17/2020   ANIONGAP 7 09/27/2020   EGFR 33 (L) 10/17/2020   GFR 34.35 (L) 04/10/2014   Lab Results  Component Value Date   CHOL 145 02/08/2019   Lab Results  Component Value Date   HDL 55 02/08/2019   Lab Results  Component Value Date   LDLCALC 72 02/08/2019   Lab Results  Component Value Date   TRIG 91 02/08/2019   Lab Results  Component Value Date   CHOLHDL 2.6 02/08/2019   Lab Results  Component Value Date   HGBA1C 5.0 02/22/2015       Assessment & Plan:   Avrielle was seen today for tick  removal.  Diagnoses and all orders for this visit:  Tick bite of abdominal wall, initial encounter Will treat with antibiotics to prevent infection as family struggled to remove the attached tick with tweezers. Discussed signs and symptoms of lyme disease and infection.  -     doxycycline (VIBRA-TABS) 100 MG tablet; Take 1 tablet (100 mg total) by mouth 2 (two) times daily for 7 days. 1 po bid  Essential hypertension Elevated today in office. Asymptomatic. Patient reports that she has not had her medications yet this morning and that her BP is normally well controlled. Instructed patient to take medications when she gets home and recheck her BP 1 hour later. Notify office of elevated readings.   Return to office for new or worsening symptoms, or if symptoms persist.   The patient indicates understanding of these issues and agrees with the plan.  Gwenlyn Perking, FNP

## 2020-12-27 NOTE — Patient Instructions (Signed)
Tick Bite Information, Adult Ticks are insects that draw blood for food. Most ticks live in shrubs and grassy and wooded areas. They climb onto people and animals that brush against the leaves and grasses that they rest on. Then they bite, attaching themselves to the skin. Most ticks are harmless, but some ticks may carry germs that can spread to a person through a bite and cause a disease. To reduce your risk of getting a disease from a tick bite, make sure you:  Take steps to prevent tick bites.  Check for ticks after being outdoors where ticks live.  Watch for symptoms of disease if a tick attached to you or if you suspect a tick bite. How can I prevent tick bites? Take these steps to help prevent tick bites when you go outdoors in an area where ticks live: Use insect repellent  Use insect repellent that has DEET (20% or higher), picaridin, or IR3535 in it. Follow the instructions on the label. Use these products on: ? Bare skin. ? The top of your boots. ? Your pant legs. ? Your sleeve cuffs.  For insect repellent that contains permethrin, follow the instructions on the label. Use these products on: ? Clothing. ? Boots. ? Outdoor gear. ? Tents. When you are outside  Wear protective clothing. Long sleeves and long pants offer the best protection from ticks.  Wear light-colored clothing so you can see ticks more easily.  Tuck your pant legs into your socks.  If you go walking on a trail, stay in the middle of the trail so your skin, hair, and clothing do not touch the bushes.  Avoid walking through areas with long grass.  Check for ticks on your clothing, hair, and skin often while you are outside, and check again before you go inside. Make sure to check the scalp, neck, armpits, waist, groin, and joint areas. These are the spots where ticks attach themselves most often. When you go indoors  Check your clothing for ticks. Tumble dry clothes in a dryer on high heat for at least  10 minutes. If clothes are damp, additional time may be needed. If clothes require washing, use hot water.  Examine gear and pets.  Shower soon after being outdoors.  Check your body for ticks. Conduct a full body check using a mirror. What is the proper way to remove a tick? If you find a tick on your body, remove it as soon as possible. Removing a tick sooner can prevent germs from passing to your body. Do not remove the tick with your bare fingers. To remove a tick that is crawling on your skin but has not bitten, use either of these methods:  Go outdoors and brush the tick off.  Remove the tick with tape or a lint roller. To remove a tick that is attached to your skin: 1. Wash your hands. If you have latex gloves, put them on. 2. Use fine-tipped tweezers, curved forceps, or a tick-removal tool to gently grasp the tick as close to your skin and the tick's head as possible. 3. Gently pull with a steady, upward, even pressure until the tick lets go. 4. When removing the tick: ? Take care to keep the tick's head attached to its body. ? Do not twist or jerk the tick. This can make the tick's head or mouth parts break off and remain in the skin. ? Do not squeeze or crush the tick's body. This could force disease-carrying fluids from the tick   into your body. Do not try to remove a tick with heat, alcohol, petroleum jelly, or fingernail polish. Using these methods can cause the tick to salivate and regurgitate into your bloodstream, increasing your risk of getting a disease.   What should I do after removing a tick?  Dispose of the tick. Do not crush a tick with your fingers.  Clean the bite area and your hands with soap and water, rubbing alcohol, or an iodine scrub.  If an antiseptic cream or ointment is available, apply a small amount to the bite site.  Wash and disinfect any instruments that you used to remove the tick. How should I dispose of a tick? To dispose of a live tick, use  one of these methods:  Place it in rubbing alcohol.  Place it in a sealed bag or container.  Wrap it tightly in tape.  Flush it down the toilet. Contact a health care provider if:  You have symptoms of a disease after a tick bite. Symptoms of a tick-borne disease can occur from moments after the tick bites to 30 days after a tick is removed. Symptoms include: ? Fever or chills. ? Any of these signs in the bite area:  A red rash that makes a circle (bull's-eye rash) in the bite area.  Redness and swelling. ? Headache. ? Muscle, joint, or bone pain. ? Abnormal tiredness. ? Numbness in your legs or difficulty walking or moving your legs. ? Tender, swollen lymph glands.  A part of a tick breaks off and gets stuck in your skin. Get help right away if:  You are not able to remove a tick.  You experience muscle weakness or paralysis.  Your symptoms get worse or you experience new symptoms.  You find an engorged tick on your skin and you are in an area where disease from ticks is a high risk. Summary  Ticks may carry germs that can spread to a person through a bite and cause a disease.  Wear protective clothing and use insect repellent to prevent tick bites. Follow the instructions on the label.  If you find a tick on your body, remove it as soon as possible. If the tick is attached, do not try to remove with heat, alcohol, petroleum jelly, or fingernail polish.  Remove the attached tick using fine-tipped tweezers, curved forceps, or a tick-removal tool. Gently pull with steady, upward, even pressure until the tick lets go. Do not twist or jerk the tick. Do not squeeze or crush the tick's body.  If you have symptoms of a disease after being bitten by a tick, contact a health care provider. This information is not intended to replace advice given to you by your health care provider. Make sure you discuss any questions you have with your health care provider. Document Revised:  08/01/2019 Document Reviewed: 08/01/2019 Elsevier Patient Education  2021 Elsevier Inc.  

## 2020-12-31 ENCOUNTER — Other Ambulatory Visit: Payer: Self-pay | Admitting: Family Medicine

## 2020-12-31 DIAGNOSIS — I10 Essential (primary) hypertension: Secondary | ICD-10-CM

## 2021-01-01 ENCOUNTER — Ambulatory Visit: Payer: PPO | Admitting: Physician Assistant

## 2021-01-03 ENCOUNTER — Other Ambulatory Visit: Payer: Self-pay

## 2021-01-03 ENCOUNTER — Ambulatory Visit (INDEPENDENT_AMBULATORY_CARE_PROVIDER_SITE_OTHER): Payer: PPO | Admitting: Physician Assistant

## 2021-01-03 ENCOUNTER — Encounter: Payer: Self-pay | Admitting: Physician Assistant

## 2021-01-03 DIAGNOSIS — S30861A Insect bite (nonvenomous) of abdominal wall, initial encounter: Secondary | ICD-10-CM

## 2021-01-03 DIAGNOSIS — W57XXXA Bitten or stung by nonvenomous insect and other nonvenomous arthropods, initial encounter: Secondary | ICD-10-CM

## 2021-01-03 NOTE — Progress Notes (Signed)
   Follow-Up Visit   Subjective  [name] is a 87 y.o. female who presents for the following: Skin Problem (Patient has a lesion on her left side abdomen. X 1 week. Thought it might be a tick or a mole. Her daughter pulled it off. PCP gave patient antibiotic (doxycycline) just in case it was tick and she finished yesterday.) She had no nausea, vomiting, fever or head ache.   The following portions of the chart were reviewed this encounter and updated as appropriate:  Tobacco  Allergies  Meds  Problems  Med Hx  Surg Hx  Fam Hx      Objective  Well appearing patient in no apparent distress; mood and affect are within normal limits.  A focused examination was performed including left flank. Relevant physical exam findings are noted in the Assessment and Plan.  Objective  Left Flank: Possible tick bite. Pink papule with small scab. No signs of an embedded tick.   Assessment & Plan  Insect bite (nonvenomous) of abdominal wall, initial encounter Left Flank  Pandal samples for the itching.  Patient to call if the lesion changes or fails to heal completely.  I, Jamee Keach, PA-C, have reviewed all documentation's for this visit.  The documentation on 01/03/21 for the exam, diagnosis, procedures and orders are all accurate and complete.

## 2021-01-04 ENCOUNTER — Encounter: Payer: Self-pay | Admitting: Physician Assistant

## 2021-01-04 NOTE — Progress Notes (Signed)
   Follow-Up Visit   Subjective  [name] is a 87 y.o. female who presents for the following: Annual Exam (No new concerns today, per chart history bcc and scc). A few crusty areas to check.   The following portions of the chart were reviewed this encounter and updated as appropriate:      Objective  Well appearing patient in no apparent distress; mood and affect are within normal limits.  All skin waist up examined.  Objective  Left Forearm - Posterior, Left Forehead, Left Malar Cheek (2), Right Breast: Erythematous patches with gritty scale.   Assessment & Plan  AK (actinic keratosis) (5) Left Forearm - Posterior; Right Breast; Left Malar Cheek (2); Left Forehead  Destruction of lesion - Left Forearm - Posterior, Left Forehead, Left Malar Cheek, Right Breast Complexity: simple   Destruction method: cryotherapy   Informed consent: discussed and consent obtained   Timeout:  patient name, date of birth, surgical site, and procedure verified Lesion destroyed using liquid nitrogen: Yes   Cryotherapy cycles:  3 Outcome: patient tolerated procedure well with no complications   Post-procedure details: wound care instructions given      I, Tarissa Kerin, PA-C, have reviewed all documentation's for this visit.  The documentation on 01/04/21 for the exam, diagnosis, procedures and orders are all accurate and complete.

## 2021-01-08 ENCOUNTER — Other Ambulatory Visit: Payer: Self-pay | Admitting: Family Medicine

## 2021-01-16 ENCOUNTER — Emergency Department (HOSPITAL_COMMUNITY): Payer: PPO

## 2021-01-16 ENCOUNTER — Other Ambulatory Visit: Payer: Self-pay

## 2021-01-16 ENCOUNTER — Ambulatory Visit (INDEPENDENT_AMBULATORY_CARE_PROVIDER_SITE_OTHER): Payer: PPO | Admitting: Nurse Practitioner

## 2021-01-16 ENCOUNTER — Emergency Department (HOSPITAL_COMMUNITY)
Admission: EM | Admit: 2021-01-16 | Discharge: 2021-01-16 | Disposition: A | Discharge status: Home / Self Care | Payer: PPO | Attending: Emergency Medicine | Admitting: Emergency Medicine

## 2021-01-16 ENCOUNTER — Encounter (HOSPITAL_COMMUNITY): Payer: Self-pay

## 2021-01-16 ENCOUNTER — Encounter: Payer: Self-pay | Admitting: Nurse Practitioner

## 2021-01-16 DIAGNOSIS — J45909 Unspecified asthma, uncomplicated: Secondary | ICD-10-CM | POA: Diagnosis not present

## 2021-01-16 DIAGNOSIS — K8020 Calculus of gallbladder without cholecystitis without obstruction: Secondary | ICD-10-CM | POA: Diagnosis not present

## 2021-01-16 DIAGNOSIS — Z853 Personal history of malignant neoplasm of breast: Secondary | ICD-10-CM | POA: Diagnosis not present

## 2021-01-16 DIAGNOSIS — R110 Nausea: Secondary | ICD-10-CM | POA: Insufficient documentation

## 2021-01-16 DIAGNOSIS — Z91199 Patient's noncompliance with other medical treatment and regimen due to unspecified reason: Secondary | ICD-10-CM | POA: Insufficient documentation

## 2021-01-16 DIAGNOSIS — Z5329 Procedure and treatment not carried out because of patient's decision for other reasons: Secondary | ICD-10-CM

## 2021-01-16 DIAGNOSIS — Z79899 Other long term (current) drug therapy: Secondary | ICD-10-CM | POA: Insufficient documentation

## 2021-01-16 DIAGNOSIS — I700 Atherosclerosis of aorta: Secondary | ICD-10-CM | POA: Diagnosis not present

## 2021-01-16 DIAGNOSIS — I5032 Chronic diastolic (congestive) heart failure: Secondary | ICD-10-CM | POA: Diagnosis not present

## 2021-01-16 DIAGNOSIS — I130 Hypertensive heart and chronic kidney disease with heart failure and stage 1 through stage 4 chronic kidney disease, or unspecified chronic kidney disease: Secondary | ICD-10-CM | POA: Insufficient documentation

## 2021-01-16 DIAGNOSIS — N183 Chronic kidney disease, stage 3 unspecified: Secondary | ICD-10-CM | POA: Diagnosis not present

## 2021-01-16 DIAGNOSIS — R197 Diarrhea, unspecified: Secondary | ICD-10-CM | POA: Insufficient documentation

## 2021-01-16 DIAGNOSIS — K5790 Diverticulosis of intestine, part unspecified, without perforation or abscess without bleeding: Secondary | ICD-10-CM | POA: Diagnosis not present

## 2021-01-16 DIAGNOSIS — E039 Hypothyroidism, unspecified: Secondary | ICD-10-CM | POA: Insufficient documentation

## 2021-01-16 DIAGNOSIS — R1032 Left lower quadrant pain: Secondary | ICD-10-CM | POA: Diagnosis not present

## 2021-01-16 DIAGNOSIS — M47816 Spondylosis without myelopathy or radiculopathy, lumbar region: Secondary | ICD-10-CM | POA: Diagnosis not present

## 2021-01-16 LAB — CBC WITH DIFFERENTIAL/PLATELET
Abs Immature Granulocytes: 0.01 10*3/uL (ref 0.00–0.07)
Basophils Absolute: 0 10*3/uL (ref 0.0–0.1)
Basophils Relative: 1 %
Eosinophils Absolute: 0 10*3/uL (ref 0.0–0.5)
Eosinophils Relative: 0 %
HCT: 35 % — ABNORMAL LOW (ref 36.0–46.0)
Hemoglobin: 11.4 g/dL — ABNORMAL LOW (ref 12.0–15.0)
Immature Granulocytes: 0 %
Lymphocytes Relative: 15 %
Lymphs Abs: 0.6 10*3/uL — ABNORMAL LOW (ref 0.7–4.0)
MCH: 28.6 pg (ref 26.0–34.0)
MCHC: 32.6 g/dL (ref 30.0–36.0)
MCV: 87.9 fL (ref 80.0–100.0)
Monocytes Absolute: 0.2 10*3/uL (ref 0.1–1.0)
Monocytes Relative: 5 %
Neutro Abs: 3.5 10*3/uL (ref 1.7–7.7)
Neutrophils Relative %: 79 %
Platelets: 150 10*3/uL (ref 150–400)
RBC: 3.98 MIL/uL (ref 3.87–5.11)
RDW: 14 % (ref 11.5–15.5)
WBC: 4.4 10*3/uL (ref 4.0–10.5)
nRBC: 0 % (ref 0.0–0.2)

## 2021-01-16 LAB — COMPREHENSIVE METABOLIC PANEL
ALT: 16 U/L (ref 0–44)
AST: 27 U/L (ref 15–41)
Albumin: 4.4 g/dL (ref 3.5–5.0)
Alkaline Phosphatase: 61 U/L (ref 38–126)
Anion gap: 9 (ref 5–15)
BUN: 27 mg/dL — ABNORMAL HIGH (ref 8–23)
CO2: 30 mmol/L (ref 22–32)
Calcium: 9.5 mg/dL (ref 8.9–10.3)
Chloride: 92 mmol/L — ABNORMAL LOW (ref 98–111)
Creatinine, Ser: 1.51 mg/dL — ABNORMAL HIGH (ref 0.44–1.00)
GFR, Estimated: 34 mL/min — ABNORMAL LOW (ref >60)
Glucose, Bld: 125 mg/dL — ABNORMAL HIGH (ref 70–99)
Potassium: 3.6 mmol/L (ref 3.5–5.1)
Sodium: 131 mmol/L — ABNORMAL LOW (ref 135–145)
Total Bilirubin: 0.8 mg/dL (ref 0.3–1.2)
Total Protein: 8 g/dL (ref 6.5–8.1)

## 2021-01-16 LAB — LIPASE, BLOOD: Lipase: 54 U/L — ABNORMAL HIGH (ref 11–51)

## 2021-01-16 MED ORDER — ONDANSETRON 4 MG PO TBDP
4.0000 mg | ORAL_TABLET | Freq: Once | ORAL | Status: AC
  Administered 2021-01-16: 4 mg via ORAL
  Filled 2021-01-16: qty 1

## 2021-01-16 MED ORDER — ONDANSETRON 4 MG PO TBDP
4.0000 mg | ORAL_TABLET | Freq: Three times a day (TID) | ORAL | 1 refills | Status: DC | PRN
Start: 2021-01-16 — End: 2021-02-12

## 2021-01-16 NOTE — ED Provider Notes (Signed)
Emergency Medicine Provider Triage Evaluation Note  [name] , a 87 y.o. female  was evaluated in triage.  Pt complains of left lower quadrant abdominal pain x1 day.  Abdominal pain preceded by intermittent nausea last week.  She admits to a few episodes of nonbloody diarrhea that started last night.  No fever or chills.  Denies emesis.  No previous abdominal operations.  Denies urinary vaginal symptoms.  Review of Systems  Positive: Abdominal pain, nausea, diarrhea Negative: emesis  Physical Exam  BP (!) 199/69 (BP Location: Left Arm)   Pulse 66   Temp 98.2 F (36.8 C) (Oral)   Resp 18   Ht 5\' 6"  (1.676 m)   Wt 50.3 kg   SpO2 94%   BMI 17.92 kg/m  Gen:   Awake, no distress  Resp:  Normal effort  MSK:   Moves extremities without difficulty Other:  TTP LLQ   Medical Decision Making  Medically screening exam initiated at 3:21 PM.  Appropriate orders placed.  [name] was informed that the remainder of the evaluation will be completed by another provider, this initial triage assessment does not replace that evaluation, and the importance of remaining in the ED until their evaluation is complete.  Abdominal labs and CT abdomen ordered.   Suzy Bouchard, PA-C 01/16/21 1522    Fredia Sorrow, MD 01/17/21 0030

## 2021-01-16 NOTE — Discharge Instructions (Signed)
Work-up for the abdominal pain without any acute findings.  Take the Zofran as needed for the nausea and discomfort feeling.  Make an appointment to follow-up with your primary care doctor in [place].  And also make an appointment to follow-up with gastroenterology.  Information provided above to call and make an appointment.

## 2021-01-16 NOTE — ED Triage Notes (Addendum)
Pt to er, pt states that last week tues through Thursday she had some nausea, states that Friday she felt a little bit better, but since then she has been having some LLQ abd pain.  States that she had some diarrhea last night and today

## 2021-01-17 ENCOUNTER — Encounter: Payer: Self-pay | Admitting: Internal Medicine

## 2021-01-17 NOTE — ED Provider Notes (Signed)
[facility] Provider Note   CSN: [id number] Arrival date & time: 01/16/21  1330     History Chief Complaint  Patient presents with  . Abdominal Pain    CADENCE MINTON is a 87 y.o. female.  Patient with complaint of left lower quadrant abdominal pain since Tuesday a week ago.  Had some nausea but no vomiting.  Appetite has not been good.  She had some diarrhea last night and today.  No fevers temp here 98.7 blood pressures been elevated systolics 174/08.  Not tachycardic oxygen saturation 96% on room air.  Patient is followed by [facility].  Past medical history significant for hyperlipidemia hypertension hypothyroidism asthma atrial fibrillation chronic kidney disease stage III.  Squamous cell carcinoma of the skin and basal cell carcinoma.  Patient denies any fevers.        Past Medical History:  Diagnosis Date  . Anemia   . Arthritis   . Asthma   . Atrial fibrillation ([place])   . Basal cell carcinoma 02/06/2009   Left ear targus- ([facility])  . Basal cell carcinoma 09/28/2008   Right back-(CX35FU)  . CKD (chronic kidney disease), stage III ([place])   . Heart murmur   . Hyperlipidemia   . Hypertension   . Hypothyroidism   . Nodule of right lung    Right upper lobe  . PONV (postoperative nausea and vomiting)   . Renal insufficiency    Chronic  . Renal vascular disease   . Right renal artery stenosis ([place]) 05/09/2015  . Squamous cell carcinoma of skin 09/07/2019   KA-Left shin-txpbx  . Vertigo     Patient Active Problem List   Diagnosis Date Noted  . No-show for appointment 01/16/2021  . Atrial tachycardia ([place]) 07/26/2019  . Aortic valve sclerosis 07/26/2019  . Aortic valve stenosis 12/01/2018  . Palpitations 12/01/2018  . Breast cancer, right ([place]) 04/08/2018  . Malignant neoplasm of lower-outer quadrant of right breast of female, estrogen receptor positive ([place]) 02/25/2018  . Hematoma 11/27/2016  . Severe epistaxis  11/23/2016  . Vertigo 09/09/2016  . Renal vascular disease 10/31/2015  . Nodule of right lung 10/31/2015  . Asthma without status asthmaticus 10/31/2015  . Essential hypertension, malignant   . Hypothyroidism 10/15/2015  . Pancytopenia ([place]) 10/15/2015  . Hyponatremia 10/14/2015  . Thrombocytopenia ([place]) 06/16/2015  . Right renal artery stenosis ([place]) 05/09/2015  . Chronic diastolic heart failure ([place]) 04/03/2014  . Hypertensive urgency 04/02/2014  . Headache 04/02/2014  . CKD (chronic kidney disease) stage 3, GFR 30-59 ml/min ([facility]) 04/02/2014  . Paroxysmal atrial fibrillation ([place]) 01/25/2014  . Hypokalemia 07/19/2013  . Anemia 07/19/2013  . Bilateral lower extremity edema 06/30/2013  . Hypertension with fluid overload 06/30/2013  . Pedal edema 05/10/2013  . Varicose veins of lower extremities with other complications 14/48/1856  . ABNORMAL STRESS ELECTROCARDIOGRAM 01/23/2010  . Hyperlipidemia 12/18/2009  . Essential hypertension 12/18/2009  . RENAL INSUFFICIENCY, CHRONIC 12/18/2009  . Arthropathy 12/18/2009    Past Surgical History:  Procedure Laterality Date  . BREAST LUMPECTOMY WITH RADIOACTIVE SEED LOCALIZATION Right 04/08/2018   Procedure: BREAST LUMPECTOMY WITH RADIOACTIVE SEED LOCALIZATION;  Surgeon: Rolm Bookbinder, MD;  Location: [facility];  Service: General;  Laterality: Right;  . IR GENERIC HISTORICAL  08/29/2016   IR US GUIDE VASC ACCESS RIGHT 08/29/2016 [facility]  . IR GENERIC HISTORICAL  08/29/2016   IR RENAL BILAT S&I MOD SED 08/29/2016 [facility]  . KNEE ARTHROSCOPY     2019  .  THYROIDECTOMY, PARTIAL Right 1981   middle lobe removed 1st, then right lobe removed 7-8 years later (approx. 1988)     OB History   No obstetric history on file.     Family History  Problem Relation Age of Onset  . Thyroid disease Mother   . Stroke Father   . Hypertension Brother   . Lung cancer Maternal Uncle   . Cancer Maternal Grandmother        Liver  . Lung cancer  Maternal Uncle   . Hypertension Daughter   . Hypercholesterolemia Daughter   . Hypercholesterolemia Daughter     Social History   Tobacco Use  . Smoking status: Never Smoker  . Smokeless tobacco: Never Used  Vaping Use  . Vaping Use: Never used  Substance Use Topics  . Alcohol use: No  . Drug use: No    Home Medications Prior to Admission medications   Medication Sig Start Date End Date Taking? Authorizing Provider  ondansetron (ZOFRAN ODT) 4 MG disintegrating tablet Take 1 tablet (4 mg total) by mouth every 8 (eight) hours as needed. 01/16/21  Yes Fredia Sorrow, MD  acetaminophen (TYLENOL) 325 MG tablet Take 325 mg by mouth daily as needed for moderate pain or headache.    [provider]  atenolol-chlorthalidone (TENORETIC) 50-25 MG tablet TAKE 1 TABLET BY MOUTH EVERY DAY 12/31/20   Ronnie Doss M, DO  atorvastatin (LIPITOR) 20 MG tablet TAKE 1/2 TABLET BY MOUTH EVERY DAY 12/24/20   Ronnie Doss M, DO  calcium-vitamin D (OSCAL WITH D) 500-200 MG-UNIT per tablet Take 2 tablets by mouth at bedtime.    [provider]  cholecalciferol (VITAMIN D) 1000 units tablet Take 1,000 Units by mouth daily.    [provider]  ferrous sulfate 325 (65 FE) MG tablet Take 325 mg by mouth 2 (two) times daily with a meal.    [provider]  flecainide (TAMBOCOR) 50 MG tablet TAKE 1 TABLET (50 MG TOTAL) BY MOUTH 2 (TWO) TIMES DAILY. NEED OV. 12/06/20   Minus Breeding, MD  fluticasone (FLONASE) 50 MCG/ACT nasal spray Place 1 spray into both nostrils 2 (two) times daily. 07/19/20   Dettinger, Fransisca Kaufmann, MD  hydrALAZINE (APRESOLINE) 100 MG tablet Take 100 mg by mouth 2 (two) times daily.    [provider]  hydroxypropyl methylcellulose / hypromellose (ISOPTO TEARS / GONIOVISC) 2.5 % ophthalmic solution Place 1 drop into both eyes daily.    [provider]  letrozole [facility]) 2.5 MG tablet TAKE 1 TABLET BY MOUTH EVERY DAY 10/23/20   Derek Jack, MD  levothyroxine (SYNTHROID) 75 MCG tablet Take 1 tablet (75 mcg total) by mouth daily. 10/18/20   [name], DO  lisinopril (ZESTRIL) 20 MG tablet TAKE 1 TABLET (20 MG TOTAL) BY MOUTH DAILY AS NEEDED (BLOOD PRESSURE ABOVE 150/90). 01/08/21   [name], DO    Allergies    Amlodipine, Isosorbide nitrate, Sulfonamide derivatives, and Terazosin  Review of Systems   Review of Systems  Constitutional: Positive for appetite change and fatigue. Negative for chills and fever.  HENT: Negative for rhinorrhea and sore throat.   Eyes: Negative for visual disturbance.  Respiratory: Negative for cough and shortness of breath.   Cardiovascular: Negative for chest pain and leg swelling.  Gastrointestinal: Positive for abdominal pain, diarrhea and nausea. Negative for vomiting.  Genitourinary: Negative for dysuria.  Musculoskeletal: Negative for back pain and neck pain.  Skin: Negative for rash.  Neurological: Negative for  dizziness, light-headedness and headaches.  Hematological: Does not bruise/bleed easily.  Psychiatric/Behavioral: Negative for confusion.    Physical Exam Updated Vital Signs BP (!) 204/71   Pulse 72   Temp 98.7 F (37.1 C) (Oral)   Resp 17   Ht 1.676 m (5\' 6" )   Wt 50.3 kg   SpO2 97%   BMI 17.92 kg/m   Physical Exam Vitals and nursing note reviewed.  Constitutional:      General: She is not in acute distress.    Appearance: Normal appearance. She is well-developed. She is not ill-appearing.  HENT:     Head: Normocephalic and atraumatic.  Eyes:     Extraocular Movements: Extraocular movements intact.     Conjunctiva/sclera: Conjunctivae normal.     Pupils: Pupils are equal, round, and reactive to light.  Cardiovascular:     Rate and Rhythm: Normal rate and regular rhythm.     Heart sounds: No murmur heard.   Pulmonary:     Effort: Pulmonary effort is normal. No respiratory distress.     Breath sounds: Normal breath sounds.   Abdominal:     Palpations: Abdomen is soft. There is mass.     Tenderness: There is no abdominal tenderness. There is no guarding.  Musculoskeletal:        General: No swelling. Normal range of motion.     Cervical back: Normal range of motion and neck supple.  Skin:    General: Skin is warm and dry.  Neurological:     General: No focal deficit present.     Mental Status: She is alert and oriented to person, place, and time.     Cranial Nerves: No cranial nerve deficit.     Sensory: No sensory deficit.     ED Results / Procedures / Treatments   Labs (all labs ordered are listed, but only abnormal results are displayed) Labs Reviewed  CBC WITH DIFFERENTIAL/PLATELET - Abnormal; Notable for the following components:      Result Value   Hemoglobin 11.4 (*)    HCT 35.0 (*)    Lymphs Abs 0.6 (*)    All other components within normal limits  COMPREHENSIVE METABOLIC PANEL - Abnormal; Notable for the following components:   Sodium 131 (*)    Chloride 92 (*)    Glucose, Bld 125 (*)    BUN 27 (*)    Creatinine, Ser 1.51 (*)    GFR, Estimated 34 (*)    All other components within normal limits  LIPASE, BLOOD - Abnormal; Notable for the following components:   Lipase 54 (*)    All other components within normal limits  URINALYSIS, ROUTINE W REFLEX MICROSCOPIC    EKG None  Radiology CT ABDOMEN PELVIS WO CONTRAST  Result Date: 01/16/2021 CLINICAL DATA:  Left lower quadrant pain EXAM: CT ABDOMEN AND PELVIS WITHOUT CONTRAST TECHNIQUE: Multidetector CT imaging of the abdomen and pelvis was performed following the standard protocol without IV contrast. COMPARISON:  06/03/2020 FINDINGS: Lower chest: No acute abnormality.  Heart is enlarged in size. Hepatobiliary: Cholelithiasis is noted without complicating factors. The liver is within normal limits. Pancreas: Unremarkable. No pancreatic ductal dilatation or surrounding inflammatory changes. Spleen: Normal in size without focal  abnormality. Adrenals/Urinary Tract: Adrenal glands are within normal limits. Kidneys are well visualized bilaterally without renal calculi. Right kidney is somewhat ptotic in nature but stable. No obstructive changes are seen. The bladder is decompressed. Stomach/Bowel: Appendix is within normal limits. No obstructive or inflammatory changes of the  colon are seen. Mild diverticular changes noted without diverticulitis. Small bowel and stomach appear within normal limits. Vascular/Lymphatic: Aortic atherosclerosis. No enlarged abdominal or pelvic lymph nodes. Reproductive: Uterus and bilateral adnexa are unremarkable. Other: No abdominal wall hernia or abnormality. No abdominopelvic ascites. Musculoskeletal: Degenerative changes of lumbar spine are noted. IMPRESSION: Diverticulosis without diverticulitis stable from the prior exam. Cholelithiasis without complicating factors. Stable cardiomegaly. No other focal abnormality is noted. Electronically Signed   By: Inez Catalina M.D.   On: 01/16/2021 15:43    Procedures Procedures   Medications Ordered in ED Medications  ondansetron (ZOFRAN-ODT) disintegrating tablet 4 mg (4 mg Oral Given 01/16/21 2159)    ED Course  I have reviewed the triage vital signs and the nursing notes.  Pertinent labs & imaging results that were available during my care of the patient were reviewed by me and considered in my medical decision making (see chart for details).    MDM Rules/Calculators/A&P                          Work-up for the left lower quadrant abdominal pain without any acute findings.  In particular patient had no tenderness on exam.  Abdomen was soft.  No leukocytosis.  Hemoglobin 11.4.  Complete metabolic panel significant for creatinine 1.51 GFR 34 but this is somewhat baseline for the patient.  Lipase slightly elevated at 54.  But patient without any tenderness in that area.  Erythematous left lower quadrant.  CT scan of the abdomen without any acute  findings.  There was evidence of cholelithiasis but patient's symptoms not consistent with gallbladder disease.  And liver function test without any significant abnormalities  Patient felt better after Zofran.  We will continue that.  Have her follow-up with the GI medicine referral information provided and follow back up with her primary care doctor.  Seems appropriate for patient to have colonoscopy.  Patient nontoxic no acute distress  Final Clinical Impression(s) / ED Diagnoses Final diagnoses:  Left lower quadrant abdominal pain    Rx / DC Orders ED Discharge Orders         Ordered    ondansetron (ZOFRAN ODT) 4 MG disintegrating tablet  Every 8 hours PRN        01/16/21 2316           Fredia Sorrow, MD 01/17/21 [id number]

## 2021-01-23 ENCOUNTER — Other Ambulatory Visit: Payer: Self-pay

## 2021-01-23 ENCOUNTER — Ambulatory Visit (INDEPENDENT_AMBULATORY_CARE_PROVIDER_SITE_OTHER): Payer: PPO | Admitting: Gastroenterology

## 2021-01-23 ENCOUNTER — Encounter (INDEPENDENT_AMBULATORY_CARE_PROVIDER_SITE_OTHER): Payer: Self-pay | Admitting: Gastroenterology

## 2021-01-23 DIAGNOSIS — R1032 Left lower quadrant pain: Secondary | ICD-10-CM | POA: Diagnosis not present

## 2021-01-23 DIAGNOSIS — K5900 Constipation, unspecified: Secondary | ICD-10-CM

## 2021-01-23 NOTE — Progress Notes (Signed)
[name], M.D. Gastroenterology & Hepatology [facility] [address] [place], [place] [address] Primary Care Physician: [name], DO [address] [place] [address]  Referring MD: PCP  Chief Complaint: Abdominal pain and discomfort  History of Present Illness: [name] is a 87 y.o. female with past medical history of asthma, atrial fibrillation, basal cell carcinoma, CKD, hyperlipidemia, hypertension, hypothyroidism,  who presents for evaluation of abdominal pain and discomfort.  Patient reports that for the last 2 weeks she has presented episodes of LLQ abdominal pain described as a pressure in the region above the inguinal area, along with discomfort in her mid abdomen which she describes as an "uneasy feeling". She has been moving her bowels every day at least once.  However, she reports that she has been taking oral iron which has led to some constipation in the past. Has noticed dark stools for many years after she started the iron. States that she takes a Puralax every night and it used to make her stools loose but recently she has noticed it has been harder than usual. The patient denies having any nausea, vomiting, fever, chills, hematochezia, melena, hematemesis, diarrhea, jaundice, pruritus or weight loss.  She reports that between last Friday and Tuesday she was doing fine, but she reports that today she has been feeling sick again.  The patient came to the ER on 02/05/2019 at [facility].  At that time she underwent blood work-up which showed mildly decreased sodium of 131 with rest of electrolytes within normal limits, creatinine was mildly elevated 1.51 BUN was 27, lipase was very mildly elevated 54, normal AST of 27, ALT 16, total bilirubin 0.8 and alkaline phosphatase 61, CBC showed mildly decreased hemoglobin of 11.4 (better than in the past), white blood cell count 4.4, platelets of 150.  She  underwent a CT of the abdomen and pelvis without IV contrast which showed diverticulosis and cholelithiasis but no other acute alterations. I reviewed the images and saw significant amount of stool, mostly on the left side of the colon.  Last EGD: 2016 normal Last Colonoscopy: 2016 diverticulosis, hemorrhoids  FHx: neg for any gastrointestinal/liver disease, grandmother liver cancer Social: neg smoking, alcohol or illicit drug use Surgical: no abdominal surgeries  Past Medical History: Past Medical History:  Diagnosis Date  . Anemia   . Arthritis   . Asthma   . Atrial fibrillation ([place])   . Basal cell carcinoma 02/06/2009   Left ear targus- ([facility])  . Basal cell carcinoma 09/28/2008   Right back-(CX35FU)  . CKD (chronic kidney disease), stage III ([place])   . Heart murmur   . Hyperlipidemia   . Hypertension   . Hypothyroidism   . Nodule of right lung    Right upper lobe  . PONV (postoperative nausea and vomiting)   . Renal insufficiency    Chronic  . Renal vascular disease   . Right renal artery stenosis ([place]) 05/09/2015  . Squamous cell carcinoma of skin 09/07/2019   KA-Left shin-txpbx  . Vertigo     Past Surgical History: Past Surgical History:  Procedure Laterality Date  . BREAST LUMPECTOMY WITH RADIOACTIVE SEED LOCALIZATION Right 04/08/2018   Procedure: BREAST LUMPECTOMY WITH RADIOACTIVE SEED LOCALIZATION;  Surgeon: Rolm Bookbinder, MD;  Location: [place];  Service: General;  Laterality: Right;  . IR GENERIC HISTORICAL  08/29/2016   IR US GUIDE VASC ACCESS RIGHT 08/29/2016 [facility]-INTERV RAD  . IR GENERIC HISTORICAL  08/29/2016  IR RENAL BILAT S&I MOD SED 08/29/2016 [facility]-INTERV RAD  . KNEE ARTHROSCOPY     2019  . THYROIDECTOMY, PARTIAL Right 1981   middle lobe removed 1st, then right lobe removed 7-8 years later (approx. 67)    Family History: Family History  Problem Relation Age of Onset  . Thyroid disease Mother   . Stroke Father   . Hypertension Brother   . Lung  cancer Maternal Uncle   . Cancer Maternal Grandmother        Liver  . Lung cancer Maternal Uncle   . Hypertension Daughter   . Hypercholesterolemia Daughter   . Hypercholesterolemia Daughter     Social History: Social History   Tobacco Use  Smoking Status Never Smoker  Smokeless Tobacco Never Used   Social History   Substance and Sexual Activity  Alcohol Use No   Social History   Substance and Sexual Activity  Drug Use No    Allergies: Allergies  Allergen Reactions  . Amlodipine Other (See Comments)    edema  . Isosorbide Nitrate Nausea Only  . Sulfonamide Derivatives Nausea Only and Rash  . Terazosin Hives and Nausea Only    Medications: Current Outpatient Medications  Medication Sig Dispense Refill  . acetaminophen (TYLENOL) 325 MG tablet Take 325 mg by mouth daily as needed for moderate pain or headache.    Marland Kitchen atenolol-chlorthalidone (TENORETIC) 50-25 MG tablet TAKE 1 TABLET BY MOUTH EVERY DAY 90 tablet 0  . atorvastatin (LIPITOR) 20 MG tablet TAKE 1/2 TABLET BY MOUTH EVERY DAY 45 tablet 0  . calcium-vitamin D (OSCAL WITH D) 500-200 MG-UNIT per tablet Take 2 tablets by mouth at bedtime.    . cholecalciferol (VITAMIN D) 1000 units tablet Take 1,000 Units by mouth daily.    . ferrous sulfate 325 (65 FE) MG tablet Take 325 mg by mouth 2 (two) times daily with a meal.    . flecainide (TAMBOCOR) 50 MG tablet TAKE 1 TABLET (50 MG TOTAL) BY MOUTH 2 (TWO) TIMES DAILY. NEED OV. 90 tablet 1  . fluticasone (FLONASE) 50 MCG/ACT nasal spray Place 1 spray into both nostrils 2 (two) times daily. 16 g 5  . hydrALAZINE (APRESOLINE) 100 MG tablet Take 100 mg by mouth 2 (two) times daily.    . hydroxypropyl methylcellulose / hypromellose (ISOPTO TEARS / GONIOVISC) 2.5 % ophthalmic solution Place 1 drop into both eyes daily.    Marland Kitchen letrozole (FEMARA) 2.5 MG tablet TAKE 1 TABLET BY MOUTH EVERY DAY 90 tablet 4  . levothyroxine (SYNTHROID) 75 MCG tablet Take 1 tablet (75 mcg total) by  mouth daily. 90 tablet 3  . lisinopril (ZESTRIL) 20 MG tablet TAKE 1 TABLET (20 MG TOTAL) BY MOUTH DAILY AS NEEDED (BLOOD PRESSURE ABOVE 150/90). 90 tablet 1  . ondansetron (ZOFRAN ODT) 4 MG disintegrating tablet Take 1 tablet (4 mg total) by mouth every 8 (eight) hours as needed. 10 tablet 1   No current facility-administered medications for this visit.    Review of Systems: GENERAL: negative for malaise, night sweats HEENT: No changes in hearing or vision, no nose bleeds or other nasal problems. NECK: Negative for lumps, goiter, pain and significant neck swelling RESPIRATORY: Negative for cough, wheezing CARDIOVASCULAR: Negative for chest pain, leg swelling, palpitations, orthopnea GI: SEE HPI MUSCULOSKELETAL: Negative for joint pain or swelling, back pain, and muscle pain. SKIN: Negative for lesions, rash PSYCH: Negative for sleep disturbance, mood disorder and recent psychosocial stressors. HEMATOLOGY Negative for prolonged bleeding, bruising easily, and swollen nodes.  ENDOCRINE: Negative for cold or heat intolerance, polyuria, polydipsia and goiter. NEURO: negative for tremor, gait imbalance, syncope and seizures. The remainder of the review of systems is noncontributory.   Physical Exam: BP (!) 208/73 (BP Location: Left Arm, Patient Position: Sitting, Cuff Size: Small)   Pulse 67   Temp 99 F (37.2 C) (Oral)   Ht 5\' 6"  (1.676 m)   Wt 113 lb (51.3 kg)   BMI 18.24 kg/m  GENERAL: The patient is AO x3, in no acute distress. [place]. HEENT: Head is normocephalic and atraumatic. EOMI are intact. Mouth is well hydrated and without lesions. NECK: Supple. No masses LUNGS: Clear to auscultation. No presence of rhonchi/wheezing/rales. Adequate chest expansion HEART: RRR, normal s1 and s2. ABDOMEN: Soft, nontender, no guarding, no peritoneal signs, and nondistended. BS +. No masses. EXTREMITIES: Without any cyanosis, clubbing, rash, lesions or edema. NEUROLOGIC: AOx3, no focal motor  deficit. SKIN: no jaundice, no rashes   Imaging/Labs: as above  I personally reviewed and interpreted the available labs, imaging and endoscopic files.  Impression and Plan: [name] is a 87 y.o. female with past medical history of asthma, atrial fibrillation, basal cell carcinoma, CKD, hyperlipidemia, hypertension, hypothyroidism,  who presents for evaluation of abdominal pain and discomfort.  The patient had presence of abdominal discomfort recently in the setting of possibly worsening constipation.  She had an normal CT of the abdomen and pelvis without IV contrast (imaging modality was performed without IV contrast given the worldwide shortage of contrast products, which is suboptimal).  Her symptoms could be explained due to worsening constipation that may improve with the use of MiraLAX more frequently.  Has not presented any red flag signs. If patient were to have persistent discomfort, we will consider performing an EGD or colonoscopy to explore her symptoms further.  Patient understood and agreed.  - Start taking Miralax 2 capful every night . If after two weeks there is no improvement, increase to 3 capful every night. - Patient to call back if symptoms do not improve with laxative, may need to schedule EGD and colonoscopy  All questions were answered.      [name], MD Gastroenterology and Hepatology [facility]

## 2021-01-23 NOTE — Patient Instructions (Addendum)
Start taking Miralax 2 capful every night . If after two weeks there is no improvement, increase to 3 capful every night. Please call back if symptoms do not improve with laxative, may need to schedule EGD and colonoscopy

## 2021-02-12 ENCOUNTER — Telehealth (INDEPENDENT_AMBULATORY_CARE_PROVIDER_SITE_OTHER): Payer: Self-pay | Admitting: Gastroenterology

## 2021-02-12 ENCOUNTER — Other Ambulatory Visit (INDEPENDENT_AMBULATORY_CARE_PROVIDER_SITE_OTHER): Payer: Self-pay | Admitting: Gastroenterology

## 2021-02-12 DIAGNOSIS — R110 Nausea: Secondary | ICD-10-CM

## 2021-02-12 MED ORDER — ONDANSETRON 4 MG PO TBDP: 4.0000 mg | ORAL_TABLET | Freq: Three times a day (TID) | ORAL | 2 refills | Status: DC | PRN

## 2021-02-12 NOTE — Telephone Encounter (Signed)
Spoke with the patient today, she reports that she is currently taking 1 1/4 capfuls of MiraLAX every day as with this she has 2 loose bowel movements every day.  This has led to improvement of her abdominal pain but she is still feeling nauseated frequently which improved with sublingual Zofran.  I will prescribe this again.  She would like to hold off on performing an EGD unless her symptoms persist.  I think this is reasonable.  She will give [place] a call if she wants to schedule this in the future.

## 2021-02-12 NOTE — Telephone Encounter (Signed)
Would like an appointment to discuss her GI issues. She states she has a decreased appetite and feels nauseous. She states she has some constipation and has been taking the miralax two cap fulls and it caused diarrhea, and she cut it back to one capful and it seemed to help for around six days and then she started having the nausea. Then she changed the Miralax 1 1/4 capful per day and it had helped,but caused diarrhea. She had a bm three times today already.She also has been eating prunes. No blood in stools no fevers. States she feels she is too old to have TCS and EGD. Please advise.

## 2021-02-12 NOTE — Telephone Encounter (Signed)
Patient called stated she was seen on 6/8 for constipation - states she is not feeling any better - please advise = ph# [phone]

## 2021-02-15 ENCOUNTER — Ambulatory Visit: Payer: PPO | Admitting: Family Medicine

## 2021-02-20 ENCOUNTER — Other Ambulatory Visit: Payer: Self-pay

## 2021-02-20 ENCOUNTER — Other Ambulatory Visit: Payer: PPO

## 2021-02-20 DIAGNOSIS — N1831 Chronic kidney disease, stage 3a: Secondary | ICD-10-CM

## 2021-02-21 ENCOUNTER — Ambulatory Visit: Payer: PPO | Admitting: Internal Medicine

## 2021-02-21 DIAGNOSIS — M79672 Pain in left foot: Secondary | ICD-10-CM | POA: Diagnosis not present

## 2021-02-21 DIAGNOSIS — M7989 Other specified soft tissue disorders: Secondary | ICD-10-CM | POA: Diagnosis not present

## 2021-02-21 LAB — RENAL FUNCTION PANEL
Albumin: 4.1 g/dL (ref 3.6–4.6)
BUN/Creatinine Ratio: 19 (ref 12–28)
BUN: 27 mg/dL (ref 8–27)
CO2: 27 mmol/L (ref 20–29)
Calcium: 9.4 mg/dL (ref 8.7–10.3)
Chloride: 94 mmol/L — ABNORMAL LOW (ref 96–106)
Creatinine, Ser: 1.41 mg/dL — ABNORMAL HIGH (ref 0.57–1.00)
Glucose: 105 mg/dL — ABNORMAL HIGH (ref 65–99)
Phosphorus: 3.6 mg/dL (ref 3.0–4.3)
Potassium: 4 mmol/L (ref 3.5–5.2)
Sodium: 135 mmol/L (ref 134–144)
eGFR: 37 mL/min/{1.73_m2} — ABNORMAL LOW (ref >59)

## 2021-02-22 ENCOUNTER — Telehealth: Payer: Self-pay | Admitting: *Deleted

## 2021-02-22 ENCOUNTER — Ambulatory Visit (INDEPENDENT_AMBULATORY_CARE_PROVIDER_SITE_OTHER): Payer: PPO

## 2021-02-22 ENCOUNTER — Ambulatory Visit (INDEPENDENT_AMBULATORY_CARE_PROVIDER_SITE_OTHER): Payer: PPO | Admitting: Nurse Practitioner

## 2021-02-22 ENCOUNTER — Encounter: Payer: Self-pay | Admitting: Nurse Practitioner

## 2021-02-22 ENCOUNTER — Other Ambulatory Visit: Payer: Self-pay

## 2021-02-22 VITALS — BP 122/48 | HR 63 | Temp 98.0°F | Ht 65.0 in | Wt 113.0 lb

## 2021-02-22 DIAGNOSIS — M79671 Pain in right foot: Secondary | ICD-10-CM | POA: Diagnosis not present

## 2021-02-22 DIAGNOSIS — R52 Pain, unspecified: Secondary | ICD-10-CM | POA: Insufficient documentation

## 2021-02-22 DIAGNOSIS — M79672 Pain in left foot: Secondary | ICD-10-CM

## 2021-02-22 DIAGNOSIS — R609 Edema, unspecified: Secondary | ICD-10-CM | POA: Insufficient documentation

## 2021-02-22 DIAGNOSIS — M7989 Other specified soft tissue disorders: Secondary | ICD-10-CM | POA: Diagnosis not present

## 2021-02-22 MED ORDER — DICLOFENAC SODIUM 1 % EX GEL: 2.0000 g | Freq: Four times a day (QID) | CUTANEOUS | 0 refills | Status: DC

## 2021-02-22 NOTE — Patient Instructions (Signed)
Edema  Edema is when you have too much fluid in your body or under your skin. Edema may make your legs, feet, and ankles swell up. Swelling is also common in looser tissues, like around your eyes. This is a common condition. It gets more common as you get older. There are many possible causes of edema. Eating too much salt (sodium) and being on your feet or sitting for a long time can cause edema in yourlegs, feet, and ankles. Hot weather may make edema worse. Edema is usually painless. Your skin may look swollen or shiny. Follow these instructions at home: Keep the swollen body part raised (elevated) above the level of your heart when you are sitting or lying down. Do not sit still or stand for a long time. Do not wear tight clothes. Do not wear garters on your upper legs. Exercise your legs. This can help the swelling go down. Wear elastic bandages or support stockings as told by your doctor. Eat a low-salt (low-sodium) diet to reduce fluid as told by your doctor. Depending on the cause of your swelling, you may need to limit how much fluid you drink (fluid restriction). Take over-the-counter and prescription medicines only as told by your doctor. Contact a doctor if: Treatment is not working. You have heart, liver, or kidney disease and have symptoms of edema. You have sudden and unexplained weight gain. Get help right away if: You have shortness of breath or chest pain. You cannot breathe when you lie down. You have pain, redness, or warmth in the swollen areas. You have heart, liver, or kidney disease and get edema all of a sudden. You have a fever and your symptoms get worse all of a sudden. Summary Edema is when you have too much fluid in your body or under your skin. Edema may make your legs, feet, and ankles swell up. Swelling is also common in looser tissues, like around your eyes. Raise (elevate) the swollen body part above the level of your heart when you are sitting or lying  down. Follow your doctor's instructions about diet and how much fluid you can drink (fluid restriction). This information is not intended to replace advice given to you by your health care provider. Make sure you discuss any questions you have with your healthcare provider. Document Revised: 05/30/2020 Document Reviewed: 05/30/2020 Elsevier Patient Education  2022 Elsevier Inc.  

## 2021-02-22 NOTE — Progress Notes (Signed)
---------------------------------------------------------------------------------------------------   Acute Office Visit  Subjective:    Patient ID: [name], female    DOB: [birth year], 87 y.o.   MRN: [id number]  Chief Complaint  Patient presents with   Foot Pain   Edema       Edema  HPI   Pain  She reports recurrent right lower leg pain. was not an injury that may have caused the pain. The pain started a few days ago and is staying constant. The pain does not radiate. The pain is described as aching, is moderate in intensity, occurring intermittently. Symptoms are worse in the: morning, mid-day  Aggravating factors: standing and walking Relieving factors: none.  She has tried application of ice with no relief.   Edema: Patient complains of edema. The location of the edema is lower leg(s) right.  The edema has been mild.  Onset of symptoms was a few days ago, unchanged since that time. The edema is present all day. The patient states never.  The swelling has been aggravated by nothing, relieved by nothing, and been associated with nothing. Cardiac risk factors include advanced age (older than 5 for men, 32 for women).   Past Medical History:  Diagnosis Date   Anemia    Arthritis    Asthma    Atrial fibrillation ([place])    Basal cell carcinoma 02/06/2009   Left ear targus- ([facility])   Basal cell carcinoma 09/28/2008   Right back-(CX35FU)   CKD (chronic kidney disease), stage III ([facility])    Heart murmur    Hyperlipidemia    Hypertension    Hypothyroidism    Nodule of right lung    Right upper lobe   PONV (postoperative nausea and vomiting)    Renal insufficiency    Chronic   Renal vascular disease    Right renal artery stenosis ([place]) 05/09/2015   Squamous cell carcinoma of skin 09/07/2019   KA-Left shin-txpbx   Vertigo     Past Surgical History:  Procedure Laterality Date   BREAST LUMPECTOMY WITH RADIOACTIVE SEED LOCALIZATION Right 04/08/2018   Procedure: BREAST  LUMPECTOMY WITH RADIOACTIVE SEED LOCALIZATION;  Surgeon: Rolm Bookbinder, MD;  Location: [place];  Service: General;  Laterality: Right;   IR GENERIC HISTORICAL  08/29/2016   IR US GUIDE VASC ACCESS RIGHT 08/29/2016 [facility]-INTERV RAD   IR GENERIC HISTORICAL  08/29/2016   IR RENAL BILAT S&I MOD SED 08/29/2016 [facility]-INTERV RAD   KNEE ARTHROSCOPY     2019   THYROIDECTOMY, PARTIAL Right 1981   middle lobe removed 1st, then right lobe removed 7-8 years later (approx. 67)    Family History  Problem Relation Age of Onset   Thyroid disease Mother    Stroke Father    Hypertension Brother    Lung cancer Maternal Uncle    Cancer Maternal Grandmother        Liver   Lung cancer Maternal Uncle    Hypertension Daughter    Hypercholesterolemia Daughter    Hypercholesterolemia Daughter     Social History   Socioeconomic History   Marital status: Widowed    Spouse name: Not on file   Number of children: 2   Years of education: 29   Highest education level: Not on file  Occupational History   Occupation: Retired  Tobacco Use   Smoking status: Never   Smokeless tobacco: Never  Vaping Use   Vaping Use: Never used  Substance and Sexual Activity   Alcohol use: No   Drug use: No  Sexual activity: Never  Other Topics Concern   Not on file  Social History Narrative   Lives alone   Ambidextrous (writes with left hand).   No caffeine use.   Social Determinants of Health   Financial Resource Strain: Not on file  Food Insecurity: Not on file  Transportation Needs: Not on file  Physical Activity: Not on file  Stress: Not on file  Social Connections: Not on file  Intimate Partner Violence: Not on file    Outpatient Medications Prior to Visit  Medication Sig Dispense Refill   acetaminophen (TYLENOL) 325 MG tablet Take 325 mg by mouth daily as needed for moderate pain or headache.     atenolol-chlorthalidone (TENORETIC) 50-25 MG tablet TAKE 1 TABLET BY MOUTH EVERY DAY 90 tablet 0    atorvastatin (LIPITOR) 20 MG tablet TAKE 1/2 TABLET BY MOUTH EVERY DAY 45 tablet 0   calcium-vitamin D (OSCAL WITH D) 500-200 MG-UNIT per tablet Take 2 tablets by mouth at bedtime.     cholecalciferol (VITAMIN D) 1000 units tablet Take 1,000 Units by mouth daily.     ferrous sulfate 325 (65 FE) MG tablet Take 325 mg by mouth 2 (two) times daily with a meal.     flecainide (TAMBOCOR) 50 MG tablet TAKE 1 TABLET (50 MG TOTAL) BY MOUTH 2 (TWO) TIMES DAILY. NEED OV. 90 tablet 1   fluticasone (FLONASE) 50 MCG/ACT nasal spray Place 1 spray into both nostrils 2 (two) times daily. 16 g 5   hydrALAZINE (APRESOLINE) 100 MG tablet Take 100 mg by mouth 2 (two) times daily.     hydroxypropyl methylcellulose / hypromellose (ISOPTO TEARS / GONIOVISC) 2.5 % ophthalmic solution Place 1 drop into both eyes daily.     letrozole (FEMARA) 2.5 MG tablet TAKE 1 TABLET BY MOUTH EVERY DAY 90 tablet 4   levothyroxine (SYNTHROID) 75 MCG tablet Take 1 tablet (75 mcg total) by mouth daily. 90 tablet 3   lisinopril (ZESTRIL) 20 MG tablet TAKE 1 TABLET (20 MG TOTAL) BY MOUTH DAILY AS NEEDED (BLOOD PRESSURE ABOVE 150/90). 90 tablet 1   ondansetron (ZOFRAN ODT) 4 MG disintegrating tablet Take 1 tablet (4 mg total) by mouth every 8 (eight) hours as needed. 30 tablet 2   No facility-administered medications prior to visit.    Allergies  Allergen Reactions   Amlodipine Other (See Comments)    edema   Isosorbide Nitrate Nausea Only   Sulfonamide Derivatives Nausea Only and Rash   Terazosin Hives and Nausea Only    Review of Systems  Constitutional: Negative.   HENT: Negative.    Respiratory: Negative.    Cardiovascular:  Positive for leg swelling.  Gastrointestinal: Negative.   Musculoskeletal:  Positive for joint swelling.  Skin:  Positive for color change.  All other systems reviewed and are negative.     Objective:    Physical Exam Vitals and nursing note reviewed.  Constitutional:      Appearance: Normal  appearance.  HENT:     Head: Normocephalic.     Mouth/Throat:     Mouth: Mucous membranes are moist.     Pharynx: Oropharynx is clear.  Eyes:     Conjunctiva/sclera: Conjunctivae normal.  Cardiovascular:     Rate and Rhythm: Normal rate and regular rhythm.     Pulses: Normal pulses.     Heart sounds: Normal heart sounds.  Pulmonary:     Effort: Pulmonary effort is normal.     Breath sounds: Normal breath sounds.  Abdominal:  General: Bowel sounds are normal.  Musculoskeletal:     Right lower leg: Tenderness present. Edema present.     Right foot: Decreased range of motion. Swelling and tenderness present.  Skin:    General: Skin is dry.  Neurological:     Mental Status: She is alert and oriented to person, place, and time.  Psychiatric:        Behavior: Behavior normal.    BP (!) 122/48   Pulse 63   Temp 98 F (36.7 C) (Temporal)   Ht '5\' 5"'  (1.651 m)   Wt 113 lb (51.3 kg)   SpO2 96%   BMI 18.80 kg/m  Wt Readings from Last 3 Encounters:  02/22/21 113 lb (51.3 kg)  01/23/21 113 lb (51.3 kg)  01/16/21 111 lb (50.3 kg)    Health Maintenance Due  Topic Date Due   COVID-19 Vaccine (4 - Booster for Moderna series) 09/12/2020   Zoster Vaccines- Shingrix (2 of 2) 12/12/2020    There are no preventive care reminders to display for this patient.   Lab Results  Component Value Date   TSH 2.320 10/17/2020   Lab Results  Component Value Date   WBC 4.4 01/16/2021   HGB 11.4 (L) 01/16/2021   HCT 35.0 (L) 01/16/2021   MCV 87.9 01/16/2021   PLT 150 01/16/2021   Lab Results  Component Value Date   NA 135 02/20/2021   K 4.0 02/20/2021   CO2 27 02/20/2021   GLUCOSE 105 (H) 02/20/2021   BUN 27 02/20/2021   CREATININE 1.41 (H) 02/20/2021   BILITOT 0.8 01/16/2021   ALKPHOS 61 01/16/2021   AST 27 01/16/2021   ALT 16 01/16/2021   PROT 8.0 01/16/2021   ALBUMIN 4.1 02/20/2021   CALCIUM 9.4 02/20/2021   ANIONGAP 9 01/16/2021   EGFR 37 (L) 02/20/2021   GFR 34.35  (L) 04/10/2014   Lab Results  Component Value Date   CHOL 145 02/08/2019   Lab Results  Component Value Date   HDL 55 02/08/2019   Lab Results  Component Value Date   LDLCALC 72 02/08/2019   Lab Results  Component Value Date   TRIG 91 02/08/2019   Lab Results  Component Value Date   CHOLHDL 2.6 02/08/2019   Lab Results  Component Value Date   HGBA1C 5.0 02/22/2015       Assessment & Plan:   Problem List Items Addressed This Visit       Other   Edema    +1 edema right feet.  Symptoms present in the last few days.  No shortness of breath, or cardiac symptoms associated with edema.  Edema was present prior in left foot but resolved few days before the right foot started.  Patient reports insurance nurse came out to visit and insisted on having a right foot x-ray.  Completed right foot x-ray results pending.  Advised patient to elevate feet, compression socks, follow-up with worsening unresolved symptoms.       Relevant Orders   DG Foot Complete Left   For home use only DME Other see comment   Pain - Primary    Bilateral lower extremity pain, symptoms unresolved present in the past few days.  No accidents or trauma associated with symptoms.  Advised patient to apply diclofenac topical gel, warm or cool compress as tolerated.  Follow-up with worsening or unresolved symptoms.       Relevant Medications   diclofenac Sodium (VOLTAREN) 1 % GEL   Other Relevant  Orders   DG Foot Complete Right   DG Foot Complete Left     Meds ordered this encounter  Medications   diclofenac Sodium (VOLTAREN) 1 % GEL    Sig: Apply 2 g topically 4 (four) times daily.    Dispense:  100 g    Refill:  0    Order Specific Question:   Supervising Provider    Answer:   [name] [[id number]]      Ivy Lynn, NP

## 2021-02-22 NOTE — Telephone Encounter (Signed)
FYI, pt called and said she would NOT be getting the TED hose, they are too difficult for her

## 2021-02-22 NOTE — Assessment & Plan Note (Signed)
Bilateral lower extremity pain, symptoms unresolved present in the past few days.  No accidents or trauma associated with symptoms.  Advised patient to apply diclofenac topical gel, warm or cool compress as tolerated.  Follow-up with worsening or unresolved symptoms.

## 2021-02-22 NOTE — Assessment & Plan Note (Signed)
+  1 edema right feet.  Symptoms present in the last few days.  No shortness of breath, or cardiac symptoms associated with edema.  Edema was present prior in left foot but resolved few days before the right foot started.  Patient reports insurance nurse came out to visit and insisted on having a right foot x-ray.  Completed right foot x-ray results pending.  Advised patient to elevate feet, compression socks, follow-up with worsening unresolved symptoms.

## 2021-02-27 ENCOUNTER — Encounter: Payer: Self-pay | Admitting: Family Medicine

## 2021-02-27 ENCOUNTER — Ambulatory Visit (INDEPENDENT_AMBULATORY_CARE_PROVIDER_SITE_OTHER): Payer: PPO | Admitting: Family Medicine

## 2021-02-27 ENCOUNTER — Other Ambulatory Visit: Payer: Self-pay

## 2021-02-27 ENCOUNTER — Telehealth: Payer: Self-pay | Admitting: Family Medicine

## 2021-02-27 VITALS — BP 145/60 | HR 53 | Temp 97.5°F | Ht 65.0 in | Wt 108.8 lb

## 2021-02-27 DIAGNOSIS — I480 Paroxysmal atrial fibrillation: Secondary | ICD-10-CM

## 2021-02-27 DIAGNOSIS — I10 Essential (primary) hypertension: Secondary | ICD-10-CM | POA: Diagnosis not present

## 2021-02-27 DIAGNOSIS — N1832 Chronic kidney disease, stage 3b: Secondary | ICD-10-CM | POA: Diagnosis not present

## 2021-02-27 DIAGNOSIS — Z23 Encounter for immunization: Secondary | ICD-10-CM

## 2021-02-27 NOTE — Telephone Encounter (Signed)
Pt wants to know what Dr Lajuana Ripple thinks about getting the 4th covid vaccine.  Please advise and call patient. [phone]

## 2021-02-27 NOTE — Telephone Encounter (Signed)
LET PT KNOW G RECOMMENDS COVID #4

## 2021-02-27 NOTE — Progress Notes (Signed)
Subjective: CC: Checkup PCP: [name], DO AQT:MAUQJ B Mcburney is a 87 y.o. female presenting to clinic today for:  1.  Hypertension with CKD 3 and atrial fibrillation Patient continues to follow-up with Dr. Posey Pronto and notes that he recently increased her lisinopril to 20 mg twice daily.  Blood pressures have been running in the 130s over 50s to 60s at home and she has been feeling relatively well on this regimen.  No reports of chest pain, shortness of breath, decreased urine output.  She is compliant with her medications for her atrial fibrillation as well.  She continues to follow with Dr. Percival Spanish regularly for appointments and believes that she has an upcoming visit with him soon.  She continues to follow-up with Dr. Delton Coombes with next visit in August.  ROS: Per HPI  Allergies  Allergen Reactions   Amlodipine Other (See Comments)    edema   Isosorbide Nitrate Nausea Only   Sulfonamide Derivatives Nausea Only and Rash   Terazosin Hives and Nausea Only   Past Medical History:  Diagnosis Date   Anemia    Arthritis    Asthma    Atrial fibrillation ([place])    Basal cell carcinoma 02/06/2009   Left ear targus- ([facility])   Basal cell carcinoma 09/28/2008   Right back-(CX35FU)   CKD (chronic kidney disease), stage III (HCC)    Heart murmur    Hyperlipidemia    Hypertension    Hypothyroidism    Nodule of right lung    Right upper lobe   PONV (postoperative nausea and vomiting)    Renal insufficiency    Chronic   Renal vascular disease    Right renal artery stenosis (HCC) 05/09/2015   Squamous cell carcinoma of skin 09/07/2019   KA-Left shin-txpbx   Vertigo     Current Outpatient Medications:    acetaminophen (TYLENOL) 325 MG tablet, Take 325 mg by mouth daily as needed for moderate pain or headache., Disp: , Rfl:    atenolol-chlorthalidone (TENORETIC) 50-25 MG tablet, TAKE 1 TABLET BY MOUTH EVERY DAY, Disp: 90 tablet, Rfl: 0   atorvastatin (LIPITOR) 20 MG tablet,  TAKE 1/2 TABLET BY MOUTH EVERY DAY, Disp: 45 tablet, Rfl: 0   calcium-vitamin D (OSCAL WITH D) 500-200 MG-UNIT per tablet, Take 2 tablets by mouth at bedtime., Disp: , Rfl:    cholecalciferol (VITAMIN D) 1000 units tablet, Take 1,000 Units by mouth daily., Disp: , Rfl:    diclofenac Sodium (VOLTAREN) 1 % GEL, Apply 2 g topically 4 (four) times daily., Disp: 100 g, Rfl: 0   ferrous sulfate 325 (65 FE) MG tablet, Take 325 mg by mouth 2 (two) times daily with a meal., Disp: , Rfl:    flecainide (TAMBOCOR) 50 MG tablet, TAKE 1 TABLET (50 MG TOTAL) BY MOUTH 2 (TWO) TIMES DAILY. NEED OV., Disp: 90 tablet, Rfl: 1   fluticasone (FLONASE) 50 MCG/ACT nasal spray, Place 1 spray into both nostrils 2 (two) times daily., Disp: 16 g, Rfl: 5   hydrALAZINE (APRESOLINE) 100 MG tablet, Take 100 mg by mouth 2 (two) times daily., Disp: , Rfl:    hydroxypropyl methylcellulose / hypromellose (ISOPTO TEARS / GONIOVISC) 2.5 % ophthalmic solution, Place 1 drop into both eyes daily., Disp: , Rfl:    letrozole (FEMARA) 2.5 MG tablet, TAKE 1 TABLET BY MOUTH EVERY DAY, Disp: 90 tablet, Rfl: 4   levothyroxine (SYNTHROID) 75 MCG tablet, Take 1 tablet (75 mcg total) by mouth daily., Disp: 90 tablet, Rfl: 3  lisinopril (ZESTRIL) 20 MG tablet, TAKE 1 TABLET (20 MG TOTAL) BY MOUTH DAILY AS NEEDED (BLOOD PRESSURE ABOVE 150/90)., Disp: 90 tablet, Rfl: 1   ondansetron (ZOFRAN ODT) 4 MG disintegrating tablet, Take 1 tablet (4 mg total) by mouth every 8 (eight) hours as needed., Disp: 30 tablet, Rfl: 2 Social History   Socioeconomic History   Marital status: Widowed    Spouse name: Not on file   Number of children: 2   Years of education: 51   Highest education level: Not on file  Occupational History   Occupation: Retired  Tobacco Use   Smoking status: Never   Smokeless tobacco: Never  Vaping Use   Vaping Use: Never used  Substance and Sexual Activity   Alcohol use: No   Drug use: No   Sexual activity: Never  Other Topics  Concern   Not on file  Social History Narrative   Lives alone   Ambidextrous (writes with left hand).   No caffeine use.   Social Determinants of Health   Financial Resource Strain: Not on file  Food Insecurity: Not on file  Transportation Needs: Not on file  Physical Activity: Not on file  Stress: Not on file  Social Connections: Not on file  Intimate Partner Violence: Not on file   Family History  Problem Relation Age of Onset   Thyroid disease Mother    Stroke Father    Hypertension Brother    Lung cancer Maternal Uncle    Cancer Maternal Grandmother        Liver   Lung cancer Maternal Uncle    Hypertension Daughter    Hypercholesterolemia Daughter    Hypercholesterolemia Daughter     Objective: Office vital signs reviewed. BP (!) 145/60   Pulse (!) 53   Temp (!) 97.5 F (36.4 C)   Ht 5\' 5"  (1.651 m)   Wt 108 lb 12.8 oz (49.4 kg)   SpO2 96%   BMI 18.11 kg/m   Physical Examination:  General: Awake, alert, well-appearing elderly female, No acute distress HEENT: Normal; sclera white Cardio: Bradycardic with seemingly regular rhythm.  S1S2 heard .  Systolic murmur present Pulm: clear to auscultation bilaterally, no wheezes, rhonchi or rales; normal work of breathing on room air Extremities: warm, well perfused   Assessment/ Plan: 87 y.o. female   Stage 3b chronic kidney disease ([place])  Paroxysmal atrial fibrillation ([place])  Essential hypertension  Renal function was stable on recent labs.  I reviewed her 12/2020 note with Dr Posey Pronto, nephrology, and it stated that she was only taking lisinopril 10 mg so reached out to her pharmacy and in fact her prescription was changed on February 01 2021 to 20 mg twice daily of lisinopril.  I have updated her MAR to reflect this.  She seemed both rate and rhythm controlled today.  She will continue medications as prescribed by her cardiologist  Blood pressure was not at goal initially but upon manual recheck it had gone down to  145/60.  No changes made.  Shingles vaccine administered  Orders Placed This Encounter  Procedures   Varicella-zoster vaccine IM (Shingrix)   No orders of the defined types were placed in this encounter.    [name], DO [place] [phone]

## 2021-02-27 NOTE — Patient Instructions (Signed)
Protein-Energy Malnutrition Protein-energy malnutrition is when a person does not eat enough protein, fat, and calories. When this happens over time, it can lead to severe loss of muscle tissue (muscle wasting). This condition also affects the body's defense system (immune system) and can lead to other health problems. What are the causes? This condition may be caused by: Not eating enough protein, fat, or calories. Having certain chronic medical conditions. Eating too little. What increases the risk? The following factors may make you more likely to develop this condition: Living in poverty. Long-term hospitalization. Alcohol or drug dependency. Addiction often leads to a lifestyle in which proper diet is ignored. Dependency can also hurt the metabolism and the body's ability to absorb nutrients. Eating disorders, such as anorexia nervosa or bulimia. Chewing or swallowing problems. People with these disorders may not eat enough. Having certain conditions, such as: Inflammatory bowel disease. Inflammation of the intestines makes it difficult for the body to absorb nutrients. Cancer or AIDS. These diseases can cause a loss of appetite. Chronic heart failure. This interferes with how the body uses nutrients. Cystic fibrosis. This disease can make it difficult for the body to absorb nutrients. Eating a diet that extremely restricts protein, fat, or calorie intake. What are the signs or symptoms? Symptoms of this condition include: Tiredness (fatigue). Weakness. Dizziness. Fainting. Weight loss. Loss of muscle tone and muscle mass. Poor immune response. Lack of menstruation. Poor memory. Hair loss. Skin changes. How is this diagnosed? This condition may be diagnosed based on: Your medical and dietary history. A physical exam. This may include a measurement of your body mass index. Blood tests. How is this treated? This condition may be managed with: Nutrition therapy. This may  include working with a dietitian. Treatment for underlying conditions. People with severe protein-energy malnutrition may need to be treated in a hospital. This may involve receiving nutrition and fluids through an IV. Follow these instructions at home:  Eat a balanced diet. In each meal, include at least one food that is high in protein. Foods that are high in protein include: Meat. Poultry. Fish. Eggs. Cheese. Milk. Beans. Nuts. Eat nutrient-rich foods that are easy to swallow and digest, such as: Fruit and yogurt smoothies. Oatmeal with nut butter. Nutrition supplement drinks. Try to eat six small meals each day instead of three large meals. Take vitamin and protein supplements as told by your health care provider or dietitian. Follow your health care provider's recommendations about exercise and activity. Keep all follow-up visits. This is important. Contact a health care provider if: You have increased weakness or fatigue. You faint. You are a woman and you stop having your period (menstruating). You have rapid hair loss. You have unexpected weight loss. You have diarrhea. You have nausea and vomiting. Get help right away if: You have difficulty breathing. You have chest pain. These symptoms may represent a serious problem that is an emergency. Do not wait to see if the symptoms will go away. Get medical help right away. Call your local emergency services (911 in the [place] Do not drive yourself to the hospital. Summary Protein-energy malnutrition is when a person does not eat enough protein, fat, and calories. Protein-energy malnutrition can lead to severe loss of muscle tissue (muscle wasting). This condition also affects the body's defense system (immune system) and can lead to other health problems. Talk with your health care provider about treatment for this condition. Effective treatment depends on the underlying cause of the malnutrition. This information is not    intended to replace advice given to you by your health care provider. Make sure you discuss any questions you have with your health care provider. Document Revised: 08/04/2020 Document Reviewed: 08/04/2020 Elsevier Patient Education  2022 Elsevier Inc.  

## 2021-02-27 NOTE — Telephone Encounter (Signed)
I have not seen any major information about the fourth COVID-vaccine within the [place].  However, I did read a very compelling study out of [place] that showed a statistically significant reduction in death and debility in patients over 49 that took the fourth COVID vaccination.  This was in comparison to people that had already been vaccinated in fact.  For this reason, I have continue to recommend the fourth COVID vaccination.  Please make sure that she times her vaccine appropriately as per CDC guidelines.

## 2021-03-03 ENCOUNTER — Other Ambulatory Visit: Payer: Self-pay | Admitting: Cardiology

## 2021-03-05 DIAGNOSIS — M79672 Pain in left foot: Secondary | ICD-10-CM | POA: Diagnosis not present

## 2021-03-08 ENCOUNTER — Ambulatory Visit (INDEPENDENT_AMBULATORY_CARE_PROVIDER_SITE_OTHER): Payer: PPO | Admitting: Family Medicine

## 2021-03-08 ENCOUNTER — Other Ambulatory Visit: Payer: Self-pay

## 2021-03-08 ENCOUNTER — Encounter: Payer: Self-pay | Admitting: Family Medicine

## 2021-03-08 VITALS — BP 184/64 | HR 71 | Temp 98.2°F | Ht 65.0 in | Wt 108.0 lb

## 2021-03-08 DIAGNOSIS — R110 Nausea: Secondary | ICD-10-CM | POA: Diagnosis not present

## 2021-03-08 DIAGNOSIS — R1110 Vomiting, unspecified: Secondary | ICD-10-CM

## 2021-03-08 MED ORDER — OMEPRAZOLE 10 MG PO CPDR: 10.0000 mg | DELAYED_RELEASE_CAPSULE | Freq: Every day | ORAL | 0 refills | Status: DC

## 2021-03-08 NOTE — Progress Notes (Signed)
Acute Office Visit  Subjective:    Patient ID: [name], female    DOB: [birth year], 87 y.o.   MRN: [id number]  Chief Complaint  Patient presents with   Nausea    HPI Patient is in today for nausea for the last few months. She also reports that she does not have much of an appetite. She feels like she has to force herself to eat. Once she eats she often feels better.  She does sometimes regurgitate food. She denies diarrhea, fever, abdominal pain, vomiting, heart burn, or indigestion. She does struggle with constipation and takes miralax daily for this.  She reports having daily small bowel movements now. Her PCP recommended drinking two ensures a day but she struggles to drink these due to her lower appetite. She takes zofran sometimes without much improvement. She is established with GI. Recent labs work was stable. CT scan about 6 weeks ago showed cholelithiasis without complicating factors and diverticulosis without diverticulitis.   Past Medical History:  Diagnosis Date   Anemia    Arthritis    Asthma    Atrial fibrillation ([place])    Basal cell carcinoma 02/06/2009   Left ear targus- ([facility])   Basal cell carcinoma 09/28/2008   Right back-(CX35FU)   CKD (chronic kidney disease), stage III (HCC)    Heart murmur    Hyperlipidemia    Hypertension    Hypothyroidism    Nodule of right lung    Right upper lobe   PONV (postoperative nausea and vomiting)    Renal insufficiency    Chronic   Renal vascular disease    Right renal artery stenosis ([place]) 05/09/2015   Squamous cell carcinoma of skin 09/07/2019   KA-Left shin-txpbx   Vertigo     Past Surgical History:  Procedure Laterality Date   BREAST LUMPECTOMY WITH RADIOACTIVE SEED LOCALIZATION Right 04/08/2018   Procedure: BREAST LUMPECTOMY WITH RADIOACTIVE SEED LOCALIZATION;  Surgeon: Rolm Bookbinder, MD;  Location: [facility];  Service: General;  Laterality: Right;   IR GENERIC HISTORICAL  08/29/2016   IR US GUIDE VASC  ACCESS RIGHT 08/29/2016 [facility]   IR GENERIC HISTORICAL  08/29/2016   IR RENAL BILAT S&I MOD SED 08/29/2016 [facility]   KNEE ARTHROSCOPY     2019   THYROIDECTOMY, PARTIAL Right 1981   middle lobe removed 1st, then right lobe removed 7-8 years later (approx. 26)    Family History  Problem Relation Age of Onset   Thyroid disease Mother    Stroke Father    Hypertension Brother    Lung cancer Maternal Uncle    Cancer Maternal Grandmother        Liver   Lung cancer Maternal Uncle    Hypertension Daughter    Hypercholesterolemia Daughter    Hypercholesterolemia Daughter     Social History   Socioeconomic History   Marital status: Widowed    Spouse name: Not on file   Number of children: 2   Years of education: 68   Highest education level: Not on file  Occupational History   Occupation: Retired  Tobacco Use   Smoking status: Never   Smokeless tobacco: Never  Vaping Use   Vaping Use: Never used  Substance and Sexual Activity   Alcohol use: No   Drug use: No   Sexual activity: Never  Other Topics Concern   Not on file  Social History Narrative   Lives alone   Ambidextrous (writes with left hand).   No caffeine  use.   Social Determinants of Health   Financial Resource Strain: Not on file  Food Insecurity: Not on file  Transportation Needs: Not on file  Physical Activity: Not on file  Stress: Not on file  Social Connections: Not on file  Intimate Partner Violence: Not on file    Outpatient Medications Prior to Visit  Medication Sig Dispense Refill   acetaminophen (TYLENOL) 325 MG tablet Take 325 mg by mouth daily as needed for moderate pain or headache.     atenolol-chlorthalidone (TENORETIC) 50-25 MG tablet TAKE 1 TABLET BY MOUTH EVERY DAY 90 tablet 0   atorvastatin (LIPITOR) 20 MG tablet TAKE 1/2 TABLET BY MOUTH EVERY DAY 45 tablet 0   calcium-vitamin D (OSCAL WITH D) 500-200 MG-UNIT per tablet Take 2 tablets by mouth at bedtime.     cholecalciferol  (VITAMIN D) 1000 units tablet Take 1,000 Units by mouth daily.     diclofenac Sodium (VOLTAREN) 1 % GEL Apply 2 g topically 4 (four) times daily. 100 g 0   ferrous sulfate 325 (65 FE) MG tablet Take 325 mg by mouth 2 (two) times daily with a meal.     flecainide (TAMBOCOR) 50 MG tablet TAKE 1 TABLET (50 MG TOTAL) BY MOUTH 2 (TWO) TIMES DAILY. NEED OV. 180 tablet 0   fluticasone (FLONASE) 50 MCG/ACT nasal spray Place 1 spray into both nostrils 2 (two) times daily. 16 g 5   hydrALAZINE (APRESOLINE) 100 MG tablet Take 100 mg by mouth 2 (two) times daily.     hydroxypropyl methylcellulose / hypromellose (ISOPTO TEARS / GONIOVISC) 2.5 % ophthalmic solution Place 1 drop into both eyes daily.     letrozole (FEMARA) 2.5 MG tablet TAKE 1 TABLET BY MOUTH EVERY DAY 90 tablet 4   levothyroxine (SYNTHROID) 75 MCG tablet Take 1 tablet (75 mcg total) by mouth daily. 90 tablet 3   lisinopril (ZESTRIL) 20 MG tablet Take 20 mg by mouth in the morning and at bedtime. Per Dr Posey Pronto 01/2021     ondansetron (ZOFRAN ODT) 4 MG disintegrating tablet Take 1 tablet (4 mg total) by mouth every 8 (eight) hours as needed. 30 tablet 2   No facility-administered medications prior to visit.    Allergies  Allergen Reactions   Amlodipine Other (See Comments)    edema   Isosorbide Nitrate Nausea Only   Sulfonamide Derivatives Nausea Only and Rash   Terazosin Hives and Nausea Only    Review of Systems As per HPI.     Objective:    Physical Exam Vitals and nursing note reviewed.  Constitutional:      General: She is not in acute distress.    Appearance: Normal appearance. She is not ill-appearing.  Cardiovascular:     Rate and Rhythm: Normal rate and regular rhythm.     Pulses: Normal pulses.     Heart sounds: Normal heart sounds. No murmur heard. Pulmonary:     Effort: Pulmonary effort is normal. No respiratory distress.     Breath sounds: Normal breath sounds.  Abdominal:     General: Bowel sounds are normal.  There is no distension.     Palpations: Abdomen is soft. There is no mass.     Tenderness: There is no abdominal tenderness. There is no guarding or rebound.     Hernia: No hernia is present.  Musculoskeletal:     Cervical back: Neck supple. No tenderness.     Right lower leg: No edema.     Left lower  leg: No edema.  Lymphadenopathy:     Cervical: No cervical adenopathy.  Skin:    General: Skin is warm and dry.  Neurological:     General: No focal deficit present.     Mental Status: She is alert and oriented to person, place, and time.  Psychiatric:        Mood and Affect: Mood normal.        Behavior: Behavior normal.    BP (!) 184/64   Pulse 71   Temp 98.2 F (36.8 C)   Ht _0  (1.651 m)   Wt 108 lb (49 kg)   SpO2 92%   BMI 17.97 kg/m  Wt Readings from Last 3 Encounters:  03/08/21 108 lb (49 kg)  02/27/21 108 lb 12.8 oz (49.4 kg)  02/22/21 113 lb (51.3 kg)    There are no preventive care reminders to display for this patient.  There are no preventive care reminders to display for this patient.   Lab Results  Component Value Date   TSH 2.320 10/17/2020   Lab Results  Component Value Date   WBC 4.4 01/16/2021   HGB 11.4 (L) 01/16/2021   HCT 35.0 (L) 01/16/2021   MCV 87.9 01/16/2021   PLT 150 01/16/2021   Lab Results  Component Value Date   NA 135 02/20/2021   K 4.0 02/20/2021   CO2 27 02/20/2021   GLUCOSE 105 (H) 02/20/2021   BUN 27 02/20/2021   CREATININE 1.41 (H) 02/20/2021   BILITOT 0.8 01/16/2021   ALKPHOS 61 01/16/2021   AST 27 01/16/2021   ALT 16 01/16/2021   PROT 8.0 01/16/2021   ALBUMIN 4.1 02/20/2021   CALCIUM 9.4 02/20/2021   ANIONGAP 9 01/16/2021   EGFR 37 (L) 02/20/2021   GFR 34.35 (L) 04/10/2014   Lab Results  Component Value Date   CHOL 145 02/08/2019   Lab Results  Component Value Date   HDL 55 02/08/2019   Lab Results  Component Value Date   LDLCALC 72 02/08/2019   Lab Results  Component Value Date   TRIG 91  02/08/2019   Lab Results  Component Value Date   CHOLHDL 2.6 02/08/2019   Lab Results  Component Value Date   HGBA1C 5.0 02/22/2015       Assessment & Plan:   Babbie was seen today for nausea.  Diagnoses and all orders for this visit:  Nausea Also reports regurgitation of food. Will like prilosec as below to see if nausea improves. Benign exam today. Recent CT without acute findings. Recent labs reassuring. Zofran as needed. Small, frequent bland meals. Handout given. If no improvement, recommended follow up with GI since she is established with them.  -     omeprazole (PRILOSEC) 10 MG capsule; Take 1 capsule (10 mg total) by mouth daily.  Regurgitation of food -     omeprazole (PRILOSEC) 10 MG capsule; Take 1 capsule (10 mg total) by mouth daily.  Return to office for new or worsening symptoms, or if symptoms persist.   The patient indicates understanding of these issues and agrees with the plan.   Gwenlyn Perking, FNP

## 2021-03-08 NOTE — Patient Instructions (Signed)
Nausea, Adult Nausea is the feeling that you have an upset stomach or that you are about to vomit. Nausea on its own is not usually a serious concern, but it may be an early sign of a more serious medical problem. As nausea gets worse, it can lead to vomiting. If vomiting develops, or if you are not able to drink enough fluids, you are at risk of becoming dehydrated. Dehydration can make you tired and thirsty, cause you to have a dry mouth, and decrease how often you urinate. Older adults and people with other diseases or a weak disease-fighting system (immune system) are at higher risk for dehydration. The main goals of treating your nausea are: To relieve your nausea. To limit repeated nausea episodes. To prevent vomiting and dehydration. Follow these instructions at home: Watch your symptoms for any changes. Tell your health care provider about them.Follow these instructions as told by your health care provider. Eating and drinking     Take an oral rehydration solution (ORS). This is a drink that is sold at pharmacies and retail stores. Drink clear fluids slowly and in small amounts as you are able. Clear fluids include water, ice chips, low-calorie sports drinks, and fruit juice that has water added (diluted fruit juice). Eat bland, easy-to-digest foods in small amounts as you are able. These foods include bananas, applesauce, rice, lean meats, toast, and crackers. Avoid drinking fluids that contain a lot of sugar or caffeine, such as energy drinks, sports drinks, and soda. Avoid alcohol. Avoid spicy or fatty foods. General instructions Take over-the-counter and prescription medicines only as told by your health care provider. Rest at home while you recover. Drink enough fluid to keep your urine pale yellow. Breathe slowly and deeply when you feel nauseous. Avoid smelling things that have strong odors. Wash your hands often using soap and water. If soap and water are not available, use  hand sanitizer. Make sure that all people in your household wash their hands well and often. Keep all follow-up visits as told by your health care provider. This is important. Contact a health care provider if: Your nausea gets worse. Your nausea does not go away after two days. You vomit. You cannot drink fluids without vomiting. You have any of the following: New symptoms. A fever. A headache. Muscle cramps. A rash. Pain while urinating. You feel light-headed or dizzy. Get help right away if: You have pain in your chest, neck, arm, or jaw. You feel extremely weak or you faint. You have vomit that is bright red or looks like coffee grounds. You have bloody or black stools or stools that look like tar. You have a severe headache, a stiff neck, or both. You have severe pain, cramping, or bloating in your abdomen. You have difficulty breathing or are breathing very quickly. Your heart is beating very quickly. Your skin feels cold and clammy. You feel confused. You have signs of dehydration, such as: Dark urine, very little urine, or no urine. Cracked lips. Dry mouth. Sunken eyes. Sleepiness. Weakness. These symptoms may represent a serious problem that is an emergency. Do not wait to see if the symptoms will go away. Get medical help right away. Call your local emergency services (911 in the [place] Do not drive yourself to the hospital. Summary Nausea is the feeling that you have an upset stomach or that you are about to vomit. Nausea on its own is not usually a serious concern, but it may be an early sign of a  more serious medical problem. If vomiting develops, or if you are not able to drink enough fluids, you are at risk of becoming dehydrated. Follow recommendations for eating and drinking and take over-the-counter and prescription medicines only as told by your health care provider. Contact a health care provider right away if your symptoms worsen or you have new  symptoms. Keep all follow-up visits as told by your health care provider. This is important. This information is not intended to replace advice given to you by your health care provider. Make sure you discuss any questions you have with your healthcare provider. Document Revised: 07/05/2019 Document Reviewed: 01/12/2018 Elsevier Patient Education  [employer].

## 2021-03-10 ENCOUNTER — Other Ambulatory Visit: Payer: Self-pay

## 2021-03-10 ENCOUNTER — Ambulatory Visit (HOSPITAL_COMMUNITY)
Admission: EM | Admit: 2021-03-10 | Discharge: 2021-03-10 | Disposition: A | Discharge status: Home / Self Care | Payer: PPO | Attending: Emergency Medicine | Admitting: Emergency Medicine

## 2021-03-10 ENCOUNTER — Encounter (HOSPITAL_COMMUNITY): Payer: Self-pay | Admitting: Emergency Medicine

## 2021-03-10 ENCOUNTER — Ambulatory Visit (INDEPENDENT_AMBULATORY_CARE_PROVIDER_SITE_OTHER): Payer: PPO

## 2021-03-10 DIAGNOSIS — R059 Cough, unspecified: Secondary | ICD-10-CM

## 2021-03-10 DIAGNOSIS — J069 Acute upper respiratory infection, unspecified: Secondary | ICD-10-CM

## 2021-03-10 NOTE — ED Provider Notes (Signed)
[place]    CSN: [id number] Arrival date & time: 03/10/21  1627      History   Chief Complaint Chief Complaint  Patient presents with   Cough   Weakness   Nausea    HPI [name] is a 87 y.o. female.   Patient here for evaluation of cough, weakness, and nausea that has been ongoing for the 5 days.  Caregiver reports that cough and sore throat initially started almost 2 weeks ago.  Pt was evaluated by primary care for cough and given an antacid which has not given her any relief.  Reports taking OTC medication with minimal relief.  Denies any recent sick contacts.  Denies any trauma, injury, or other precipitating event.  Denies any specific alleviating or aggravating factors.  Denies any fevers, chest pain, N/V/D, numbness, tingling, weakness, abdominal pain, or headaches.     The history is provided by the patient and a caregiver.  Cough Associated symptoms: myalgias and shortness of breath   Associated symptoms: no chest pain   Weakness Associated symptoms: cough, myalgias and shortness of breath   Associated symptoms: no chest pain    Past Medical History:  Diagnosis Date   Anemia    Arthritis    Asthma    Atrial fibrillation ([place])    Basal cell carcinoma 02/06/2009   Left ear targus- ([facility])   Basal cell carcinoma 09/28/2008   Right back-([id number])   CKD (chronic kidney disease), stage III ([place])    Heart murmur    Hyperlipidemia    Hypertension    Hypothyroidism    Nodule of right lung    Right upper lobe   PONV (postoperative nausea and vomiting)    Renal insufficiency    Chronic   Renal vascular disease    Right renal artery stenosis ([place]) 05/09/2015   Squamous cell carcinoma of skin 09/07/2019   KA-Left shin-txpbx   Vertigo     Patient Active Problem List   Diagnosis Date Noted   Edema 02/22/2021   Pain 02/22/2021   Constipation 01/23/2021   LLQ pain 01/23/2021   No-show for appointment 01/16/2021   Atrial tachycardia ([place])  07/26/2019   Aortic valve sclerosis 07/26/2019   Aortic valve stenosis 12/01/2018   Palpitations 12/01/2018   Breast cancer, right ([place]) 04/08/2018   Malignant neoplasm of lower-outer quadrant of right breast of female, estrogen receptor positive ([place]) 02/25/2018   Hematoma 11/27/2016   Severe epistaxis 11/23/2016   Vertigo 09/09/2016   Renal vascular disease 10/31/2015   Nodule of right lung 10/31/2015   Asthma without status asthmaticus 10/31/2015   Essential hypertension, malignant    Hypothyroidism 10/15/2015   Pancytopenia ([place]) 10/15/2015   Hyponatremia 10/14/2015   Thrombocytopenia ([place]) 06/16/2015   Right renal artery stenosis ([place]) 05/09/2015   Chronic diastolic heart failure ([place]) 04/03/2014   Hypertensive urgency 04/02/2014   Headache 04/02/2014   CKD (chronic kidney disease) stage 3, GFR 30-59 ml/min ([facility]) 04/02/2014   Paroxysmal atrial fibrillation ([place]) 01/25/2014   Hypokalemia 07/19/2013   Anemia 07/19/2013   Bilateral lower extremity edema 06/30/2013   Hypertension with fluid overload 06/30/2013   Pedal edema 05/10/2013   Varicose veins of lower extremities with other complications 06/11/8526   ABNORMAL STRESS ELECTROCARDIOGRAM 01/23/2010   Hyperlipidemia 12/18/2009   Essential hypertension 12/18/2009   RENAL INSUFFICIENCY, CHRONIC 12/18/2009   Arthropathy 12/18/2009    Past Surgical History:  Procedure Laterality Date   BREAST LUMPECTOMY WITH RADIOACTIVE SEED LOCALIZATION Right 04/08/2018  Procedure: BREAST LUMPECTOMY WITH RADIOACTIVE SEED LOCALIZATION;  Surgeon: Rolm Bookbinder, MD;  Location: [facility];  Service: General;  Laterality: Right;   IR GENERIC HISTORICAL  08/29/2016   IR US GUIDE VASC ACCESS RIGHT 08/29/2016 [facility]   IR GENERIC HISTORICAL  08/29/2016   IR RENAL BILAT S&I MOD SED 08/29/2016 [facility]   KNEE ARTHROSCOPY     2019   THYROIDECTOMY, PARTIAL Right 1981   middle lobe removed 1st, then right lobe removed 7-8 years later  (approx. 1988)    OB History   No obstetric history on file.      Home Medications    Prior to Admission medications   Medication Sig Start Date End Date Taking? Authorizing Provider  acetaminophen (TYLENOL) 325 MG tablet Take 325 mg by mouth daily as needed for moderate pain or headache.    [provider]  atenolol-chlorthalidone (TENORETIC) 50-25 MG tablet TAKE 1 TABLET BY MOUTH EVERY DAY 12/31/20   Ronnie Doss M, DO  atorvastatin (LIPITOR) 20 MG tablet TAKE 1/2 TABLET BY MOUTH EVERY DAY 12/24/20   Ronnie Doss M, DO  calcium-vitamin D (OSCAL WITH D) 500-200 MG-UNIT per tablet Take 2 tablets by mouth at bedtime.    [provider]  cholecalciferol (VITAMIN D) 1000 units tablet Take 1,000 Units by mouth daily.    [provider]  diclofenac Sodium (VOLTAREN) 1 % GEL Apply 2 g topically 4 (four) times daily. 02/22/21   Ivy Lynn, NP  ferrous sulfate 325 (65 FE) MG tablet Take 325 mg by mouth 2 (two) times daily with a meal.    [provider]  flecainide (TAMBOCOR) 50 MG tablet TAKE 1 TABLET (50 MG TOTAL) BY MOUTH 2 (TWO) TIMES DAILY. NEED OV. 03/04/21   Minus Breeding, MD  fluticasone (FLONASE) 50 MCG/ACT nasal spray Place 1 spray into both nostrils 2 (two) times daily. 07/19/20   Dettinger, Fransisca Kaufmann, MD  hydrALAZINE (APRESOLINE) 100 MG tablet Take 100 mg by mouth 2 (two) times daily.    [provider]  hydroxypropyl methylcellulose / hypromellose (ISOPTO TEARS / GONIOVISC) 2.5 % ophthalmic solution Place 1 drop into both eyes daily.    [provider]  letrozole [employer]) 2.5 MG tablet TAKE 1 TABLET BY MOUTH EVERY DAY 10/23/20   Derek Jack, MD  levothyroxine (SYNTHROID) 75 MCG tablet Take 1 tablet (75 mcg total) by mouth daily. 10/18/20   [name], DO  lisinopril (ZESTRIL) 20 MG tablet Take 20 mg by mouth in the morning and at bedtime. Per Dr Posey Pronto 01/2021    [provider]  omeprazole (PRILOSEC)  10 MG capsule Take 1 capsule (10 mg total) by mouth daily. 03/08/21   Gwenlyn Perking, FNP  ondansetron (ZOFRAN ODT) 4 MG disintegrating tablet Take 1 tablet (4 mg total) by mouth every 8 (eight) hours as needed. 02/12/21   Harvel Quale, MD    Family History Family History  Problem Relation Age of Onset   Thyroid disease Mother    Stroke Father    Hypertension Brother    Lung cancer Maternal Uncle    Cancer Maternal Grandmother        Liver   Lung cancer Maternal Uncle    Hypertension Daughter    Hypercholesterolemia Daughter    Hypercholesterolemia Daughter     Social History Social History   Tobacco Use   Smoking status: Never   Smokeless tobacco: Never  Vaping Use   Vaping Use: Never used  Substance Use Topics   Alcohol use: No   Drug use: No     Allergies   Amlodipine, Isosorbide nitrate, Sulfonamide derivatives, and Terazosin   Review of Systems Review of Systems  Constitutional:  Positive for fatigue.  HENT:  Positive for congestion.   Respiratory:  Positive for cough and shortness of breath. Negative for chest tightness.   Cardiovascular:  Negative for chest pain.  Musculoskeletal:  Positive for myalgias.  Neurological:  Positive for weakness.  All other systems reviewed and are negative.   Physical Exam Triage Vital Signs ED Triage Vitals  Enc Vitals Group     BP 03/10/21 1706 (!) 166/52     Pulse Rate 03/10/21 1706 (!) 56     Resp 03/10/21 1706 20     Temp 03/10/21 1706 98.8 F (37.1 C)     Temp Source 03/10/21 1706 Oral     SpO2 03/10/21 1706 96 %     Weight --      Height --      Head Circumference --      Peak Flow --      Pain Score 03/10/21 1704 0     Pain Loc --      Pain Edu? --      Excl. in [place]? --    No data found.  Updated Vital Signs BP (!) 166/52 (BP Location: Left Arm)   Pulse (!) 56   Temp 98.8 F (37.1 C) (Oral)   Resp 20   SpO2 96%   Visual Acuity Right Eye Distance:   Left Eye Distance:   Bilateral  Distance:    Right Eye Near:   Left Eye Near:    Bilateral Near:     Physical Exam Vitals and nursing note reviewed.  Constitutional:      General: She is not in acute distress.    Appearance: Normal appearance. She is not ill-appearing, toxic-appearing or diaphoretic.  HENT:     Head: Normocephalic and atraumatic.     Nose: Congestion present.     Mouth/Throat:     Mouth: Mucous membranes are moist.     Pharynx: Oropharynx is clear. No oropharyngeal exudate or posterior oropharyngeal erythema.  Eyes:     Conjunctiva/sclera: Conjunctivae normal.  Cardiovascular:     Rate and Rhythm: Normal rate.     Pulses: Normal pulses.     Heart sounds: Normal heart sounds.  Pulmonary:     Effort: Pulmonary effort is normal.     Breath sounds: Wheezing present. No rhonchi or rales.  Chest:     Chest wall: No tenderness.  Abdominal:     General: Abdomen is flat.  Musculoskeletal:        General: Normal range of motion.     Cervical back: Normal range of motion.  Skin:    General: Skin is warm and dry.  Neurological:     General: No focal deficit present.     Mental Status: She is alert and oriented to person, place, and time.  Psychiatric:        Mood and Affect: Mood normal.     UC Treatments / Results  Labs (all labs ordered are listed, but only abnormal results are displayed) Labs Reviewed - No data to display  EKG   Radiology DG Chest 2 View  Result Date: 03/10/2021 CLINICAL DATA:  cough EXAM: CHEST - 2 VIEW COMPARISON:  August 20, 2018. FINDINGS: The cardiomediastinal silhouette is unchanged and enlarged in contour.Atherosclerotic calcifications of  the aorta. No pleural effusion. No pneumothorax. No acute pleuroparenchymal abnormality. Visualized abdomen is unremarkable. Mild degenerative changes of the thoracic spine. IMPRESSION: No acute cardiopulmonary abnormality. Electronically Signed   By: Valentino Saxon MD   On: 03/10/2021 17:35    Procedures Procedures  (including critical care time)  Medications Ordered in UC Medications - No data to display  Initial Impression / Assessment and Plan / UC Course  I have reviewed the triage vital signs and the nursing notes.  Pertinent labs & imaging results that were available during my care of the patient were reviewed by me and considered in my medical decision making (see chart for details).    Assessment negative for red flags or concerns.  Xray with no acute cardiopulmonary abnormality.  Discussed obtained COVID test but patient and caregiver declined.  Encouraged fluids and rest.  Continue conservative symptom management as discussed in discharge instructions.  Recommend following up with primary care provider in the next few days for re-evaluation if symptoms do not improve.  Go to the ED for further evaluation of any worsening symptoms.   Final Clinical Impressions(s) / UC Diagnoses   Final diagnoses:  Viral upper respiratory tract infection     Discharge Instructions      You most likely have viral illness.   You can take Tylenol and/or Ibuprofen as needed for fever reduction and pain relief.    For cough: honey 1/2 to 1 teaspoon (you can dilute the honey in water or another fluid).  You can also use guaifenesin and dextromethorphan for cough. You can use a humidifier for chest congestion and cough.  If you don't have a humidifier, you can sit in the bathroom with the hot shower running.     For sore throat: try warm salt water gargles, cepacol lozenges, throat spray, warm tea or water with lemon/honey, popsicles or ice, or OTC cold relief medicine for throat discomfort.    For congestion: take a daily anti-histamine like Zyrtec, Claritin, and a oral decongestant, such as pseudoephedrine.  You can also use Flonase 1-2 sprays in each nostril daily.    It is important to stay hydrated: drink plenty of fluids (water, gatorade/powerade/pedialyte, juices, or teas) to keep your throat moisturized  and help further relieve irritation/discomfort.   Return or go to the Emergency Department if symptoms worsen or do not improve in the next few days.      ED Prescriptions   None    PDMP not reviewed this encounter.   Pearson Forster, NP 03/10/21 1907

## 2021-03-10 NOTE — ED Triage Notes (Signed)
Pt presents with productive cough, weakness, nausea xs 5 days. States mucinex gives little relief.

## 2021-03-10 NOTE — Discharge Instructions (Signed)
You most likely have viral illness.   You can take Tylenol and/or Ibuprofen as needed for fever reduction and pain relief.    For cough: honey 1/2 to 1 teaspoon (you can dilute the honey in water or another fluid).  You can also use guaifenesin and dextromethorphan for cough. You can use a humidifier for chest congestion and cough.  If you don't have a humidifier, you can sit in the bathroom with the hot shower running.     For sore throat: try warm salt water gargles, cepacol lozenges, throat spray, warm tea or water with lemon/honey, popsicles or ice, or OTC cold relief medicine for throat discomfort.    For congestion: take a daily anti-histamine like Zyrtec, Claritin, and a oral decongestant, such as pseudoephedrine.  You can also use Flonase 1-2 sprays in each nostril daily.    It is important to stay hydrated: drink plenty of fluids (water, gatorade/powerade/pedialyte, juices, or teas) to keep your throat moisturized and help further relieve irritation/discomfort.   Return or go to the Emergency Department if symptoms worsen or do not improve in the next few days.

## 2021-03-11 ENCOUNTER — Telehealth (INDEPENDENT_AMBULATORY_CARE_PROVIDER_SITE_OTHER): Payer: Self-pay | Admitting: Gastroenterology

## 2021-03-11 NOTE — Telephone Encounter (Signed)
Spoke to the patient today, she states that she was feeling decreased appetite and was losing some weight.  Reported that she is feeling better for the last couple of days as she has been able to eat more than before.  She tries increasing the MiraLAX to 2 capfuls every day but reports that is not leading to any symptom improvement and actually caused worsening diarrhea multiple episodes associated to MiraLAX 1 capful every day.  I told her that we may need to investigate her symptoms further with an EGD and colonoscopy.  She is not interested in pursuing a colonoscopy, and would like to hold off on doing an EGD for now as she has had some respiratory issues that need to get better before she schedules any procedure.  She will give [place] a call when she has made a final decision.

## 2021-03-11 NOTE — Telephone Encounter (Signed)
Patient left voice mail message stating she is not feeling any better - having a lot of nausea & weight loss - please advise - [phone]

## 2021-03-12 DIAGNOSIS — H2513 Age-related nuclear cataract, bilateral: Secondary | ICD-10-CM | POA: Diagnosis not present

## 2021-03-12 DIAGNOSIS — H40033 Anatomical narrow angle, bilateral: Secondary | ICD-10-CM | POA: Diagnosis not present

## 2021-03-12 DIAGNOSIS — H11823 Conjunctivochalasis, bilateral: Secondary | ICD-10-CM | POA: Diagnosis not present

## 2021-03-12 DIAGNOSIS — H43393 Other vitreous opacities, bilateral: Secondary | ICD-10-CM | POA: Diagnosis not present

## 2021-03-19 DIAGNOSIS — Z1231 Encounter for screening mammogram for malignant neoplasm of breast: Secondary | ICD-10-CM | POA: Diagnosis not present

## 2021-03-24 ENCOUNTER — Other Ambulatory Visit: Payer: Self-pay | Admitting: Family Medicine

## 2021-03-28 ENCOUNTER — Other Ambulatory Visit: Payer: Self-pay | Admitting: Family Medicine

## 2021-03-28 DIAGNOSIS — I10 Essential (primary) hypertension: Secondary | ICD-10-CM

## 2021-04-05 ENCOUNTER — Other Ambulatory Visit (HOSPITAL_COMMUNITY): Payer: Self-pay

## 2021-04-05 DIAGNOSIS — C50511 Malignant neoplasm of lower-outer quadrant of right female breast: Secondary | ICD-10-CM

## 2021-04-05 DIAGNOSIS — Z170 Estrogen receptor positive status [ER+]: Secondary | ICD-10-CM

## 2021-04-08 ENCOUNTER — Encounter (HOSPITAL_COMMUNITY)
Admission: RE | Admit: 2021-04-08 | Discharge: 2021-04-08 | Disposition: A | Discharge status: Home / Self Care | Payer: PPO | Source: Ambulatory Visit | Attending: Gastroenterology | Admitting: Gastroenterology

## 2021-04-08 ENCOUNTER — Inpatient Hospital Stay (HOSPITAL_COMMUNITY): Payer: PPO

## 2021-04-08 ENCOUNTER — Other Ambulatory Visit (INDEPENDENT_AMBULATORY_CARE_PROVIDER_SITE_OTHER): Payer: Self-pay

## 2021-04-08 ENCOUNTER — Inpatient Hospital Stay (HOSPITAL_COMMUNITY): Payer: PPO | Attending: Hematology

## 2021-04-08 ENCOUNTER — Other Ambulatory Visit: Payer: Self-pay

## 2021-04-08 ENCOUNTER — Ambulatory Visit (INDEPENDENT_AMBULATORY_CARE_PROVIDER_SITE_OTHER): Payer: PPO | Admitting: Gastroenterology

## 2021-04-08 ENCOUNTER — Encounter (INDEPENDENT_AMBULATORY_CARE_PROVIDER_SITE_OTHER): Payer: Self-pay

## 2021-04-08 ENCOUNTER — Telehealth (INDEPENDENT_AMBULATORY_CARE_PROVIDER_SITE_OTHER): Payer: Self-pay

## 2021-04-08 ENCOUNTER — Encounter (HOSPITAL_COMMUNITY): Payer: Self-pay

## 2021-04-08 ENCOUNTER — Encounter (INDEPENDENT_AMBULATORY_CARE_PROVIDER_SITE_OTHER): Payer: Self-pay | Admitting: Gastroenterology

## 2021-04-08 VITALS — BP 181/55 | HR 61 | Temp 98.6°F | Ht 65.0 in | Wt 106.7 lb

## 2021-04-08 DIAGNOSIS — D61818 Other pancytopenia: Secondary | ICD-10-CM | POA: Insufficient documentation

## 2021-04-08 DIAGNOSIS — Z79899 Other long term (current) drug therapy: Secondary | ICD-10-CM | POA: Diagnosis not present

## 2021-04-08 DIAGNOSIS — K5900 Constipation, unspecified: Secondary | ICD-10-CM

## 2021-04-08 DIAGNOSIS — Z170 Estrogen receptor positive status [ER+]: Secondary | ICD-10-CM | POA: Diagnosis not present

## 2021-04-08 DIAGNOSIS — Z79811 Long term (current) use of aromatase inhibitors: Secondary | ICD-10-CM | POA: Diagnosis not present

## 2021-04-08 DIAGNOSIS — R110 Nausea: Secondary | ICD-10-CM | POA: Diagnosis not present

## 2021-04-08 DIAGNOSIS — R5383 Other fatigue: Secondary | ICD-10-CM | POA: Diagnosis not present

## 2021-04-08 DIAGNOSIS — R1110 Vomiting, unspecified: Secondary | ICD-10-CM | POA: Insufficient documentation

## 2021-04-08 DIAGNOSIS — R0602 Shortness of breath: Secondary | ICD-10-CM | POA: Diagnosis not present

## 2021-04-08 DIAGNOSIS — D631 Anemia in chronic kidney disease: Secondary | ICD-10-CM | POA: Diagnosis not present

## 2021-04-08 DIAGNOSIS — R634 Abnormal weight loss: Secondary | ICD-10-CM

## 2021-04-08 DIAGNOSIS — R112 Nausea with vomiting, unspecified: Secondary | ICD-10-CM

## 2021-04-08 DIAGNOSIS — C50511 Malignant neoplasm of lower-outer quadrant of right female breast: Secondary | ICD-10-CM | POA: Diagnosis not present

## 2021-04-08 DIAGNOSIS — N189 Chronic kidney disease, unspecified: Secondary | ICD-10-CM | POA: Diagnosis not present

## 2021-04-08 LAB — COMPREHENSIVE METABOLIC PANEL
ALT: 14 U/L (ref 0–44)
AST: 24 U/L (ref 15–41)
Albumin: 3.9 g/dL (ref 3.5–5.0)
Alkaline Phosphatase: 58 U/L (ref 38–126)
Anion gap: 7 (ref 5–15)
BUN: 25 mg/dL — ABNORMAL HIGH (ref 8–23)
CO2: 32 mmol/L (ref 22–32)
Calcium: 9.2 mg/dL (ref 8.9–10.3)
Chloride: 89 mmol/L — ABNORMAL LOW (ref 98–111)
Creatinine, Ser: 1.79 mg/dL — ABNORMAL HIGH (ref 0.44–1.00)
GFR, Estimated: 27 mL/min — ABNORMAL LOW (ref >60)
Glucose, Bld: 116 mg/dL — ABNORMAL HIGH (ref 70–99)
Potassium: 4 mmol/L (ref 3.5–5.1)
Sodium: 128 mmol/L — ABNORMAL LOW (ref 135–145)
Total Bilirubin: 0.5 mg/dL (ref 0.3–1.2)
Total Protein: 7.3 g/dL (ref 6.5–8.1)

## 2021-04-08 LAB — CBC WITH DIFFERENTIAL/PLATELET
Abs Immature Granulocytes: 0.02 10*3/uL (ref 0.00–0.07)
Basophils Absolute: 0 10*3/uL (ref 0.0–0.1)
Basophils Relative: 0 %
Eosinophils Absolute: 0 10*3/uL (ref 0.0–0.5)
Eosinophils Relative: 0 %
HCT: 32.8 % — ABNORMAL LOW (ref 36.0–46.0)
Hemoglobin: 10.7 g/dL — ABNORMAL LOW (ref 12.0–15.0)
Immature Granulocytes: 0 %
Lymphocytes Relative: 18 %
Lymphs Abs: 0.8 10*3/uL (ref 0.7–4.0)
MCH: 28.6 pg (ref 26.0–34.0)
MCHC: 32.6 g/dL (ref 30.0–36.0)
MCV: 87.7 fL (ref 80.0–100.0)
Monocytes Absolute: 0.4 10*3/uL (ref 0.1–1.0)
Monocytes Relative: 8 %
Neutro Abs: 3.3 10*3/uL (ref 1.7–7.7)
Neutrophils Relative %: 74 %
Platelets: 146 10*3/uL — ABNORMAL LOW (ref 150–400)
RBC: 3.74 MIL/uL — ABNORMAL LOW (ref 3.87–5.11)
RDW: 15 % (ref 11.5–15.5)
WBC: 4.6 10*3/uL (ref 4.0–10.5)
nRBC: 0 % (ref 0.0–0.2)

## 2021-04-08 LAB — FERRITIN: Ferritin: 507 ng/mL — ABNORMAL HIGH (ref 11–307)

## 2021-04-08 LAB — IRON AND TIBC
Iron: 33 ug/dL (ref 28–170)
Saturation Ratios: 12 % (ref 10.4–31.8)
TIBC: 269 ug/dL (ref 250–450)
UIBC: 236 ug/dL

## 2021-04-08 LAB — LACTATE DEHYDROGENASE: LDH: 123 U/L (ref 98–192)

## 2021-04-08 LAB — VITAMIN B12: Vitamin B-12: 729 pg/mL (ref 180–914)

## 2021-04-08 LAB — VITAMIN D 25 HYDROXY (VIT D DEFICIENCY, FRACTURES): Vit D, 25-Hydroxy: 76.54 ng/mL (ref 30–100)

## 2021-04-08 MED ORDER — ONDANSETRON 4 MG PO TBDP: 4.0000 mg | ORAL_TABLET | Freq: Three times a day (TID) | ORAL | 2 refills | Status: DC | PRN

## 2021-04-08 NOTE — Progress Notes (Addendum)
Referring Provider: [name], DO Primary Care Physician:  [name], DO Primary GI Physician: Jenetta Downer   Chief Complaint  Patient presents with   Constipation    Pt arrives with daughter [name]. Pt states no appetite, losing weight, nausea when taking about food, has 2 -3 small stools a day when taking miralax daily, has some dark stools. Taking iron bid. Has some  vomiting.    HPI:   [name] is a 87 y.o. female with past medical history of asthma, a fib, BCC, CKD, HLD, HTN, hypothyroidism who presents today for follow up of constipation/nausea and vomiting.   Nausea/vomiting: reports that these symptoms have been ongoing for the past 3-4 months. states that she takes zofran once daily, if she does not take it she vomits.  She states that the thought of food makes her nauseated. Daughter states she only eats a few bites at a time. She denies early satiety but states that she just does not have an appetite. Denies hematochezia, hematemesis, does endorse continued dark stools r/t iron supplementation. Denies coffee ground emesis.denies bloating or gas. States sometimes nausea is improved with BMs. She denies any abdominal pain, cramping, reflux or regurgitation. Patient and her daughter are concerned it could be her gallbladder.   Constipation: states constipation has improved some. doing miralax 1 capful nightly, states, 2-3 small BMs per day, states sometimes she feels as though she is emptying efficiently and sometimes having a BM improves her nausea, however, she states she does not have regular BMs without taking miralax, tried taking 2 capfuls but this gave her diarrhea.   Denies NSAID use/alcohol.   Last Colonoscopy:(2016) diverticulosis, hemorrhoids  Last Endoscopy:(2016) normal  Recommendations:  Proceed with EGD for further evaluation of weight loss, nausea, vomiting  Past Medical History:  Diagnosis Date   Anemia    Arthritis    Asthma     Atrial fibrillation ([place])    Basal cell carcinoma 02/06/2009   Left ear targus- ([facility])   Basal cell carcinoma 09/28/2008   Right back-(CX35FU)   CKD (chronic kidney disease), stage III (HCC)    Heart murmur    Hyperlipidemia    Hypertension    Hypothyroidism    Nodule of right lung    Right upper lobe   PONV (postoperative nausea and vomiting)    Renal insufficiency    Chronic   Renal vascular disease    Right renal artery stenosis ([place]) 05/09/2015   Squamous cell carcinoma of skin 09/07/2019   KA-Left shin-txpbx   Vertigo     Past Surgical History:  Procedure Laterality Date   BREAST LUMPECTOMY WITH RADIOACTIVE SEED LOCALIZATION Right 04/08/2018   Procedure: BREAST LUMPECTOMY WITH RADIOACTIVE SEED LOCALIZATION;  Surgeon: Rolm Bookbinder, MD;  Location: [facility];  Service: General;  Laterality: Right;   COLONOSCOPY  2016   diverticulosis, hemorrhoids   ESOPHAGOGASTRODUODENOSCOPY  2016   normal   IR GENERIC HISTORICAL  08/29/2016   IR US GUIDE VASC ACCESS RIGHT 08/29/2016 [facility]   IR GENERIC HISTORICAL  08/29/2016   IR RENAL BILAT S&I MOD SED 08/29/2016 [facility]   KNEE ARTHROSCOPY     2019   THYROIDECTOMY, PARTIAL Right 1981   middle lobe removed 1st, then right lobe removed 7-8 years later (approx. 1988)    Current Outpatient Medications  Medication Sig Dispense Refill   acetaminophen (TYLENOL) 325 MG tablet Take 325 mg by mouth daily as needed for moderate pain or headache.  atenolol-chlorthalidone (TENORETIC) 50-25 MG tablet TAKE 1 TABLET BY MOUTH EVERY DAY 90 tablet 0   atorvastatin (LIPITOR) 20 MG tablet TAKE 1/2 TABLET BY MOUTH EVERY DAY 45 tablet 0   calcium-vitamin D (OSCAL WITH D) 500-200 MG-UNIT per tablet Take 2 tablets by mouth at bedtime.     cholecalciferol (VITAMIN D) 1000 units tablet Take 1,000 Units by mouth daily.     diclofenac Sodium (VOLTAREN) 1 % GEL Apply 2 g topically 4 (four) times daily. 100 g 0   ferrous sulfate 325 (65 FE) MG  tablet Take 325 mg by mouth 2 (two) times daily with a meal.     flecainide (TAMBOCOR) 50 MG tablet TAKE 1 TABLET (50 MG TOTAL) BY MOUTH 2 (TWO) TIMES DAILY. NEED OV. 180 tablet 0   fluticasone (FLONASE) 50 MCG/ACT nasal spray Place 1 spray into both nostrils 2 (two) times daily. 16 g 5   hydrALAZINE (APRESOLINE) 100 MG tablet Take 100 mg by mouth 2 (two) times daily.     hydroxypropyl methylcellulose / hypromellose (ISOPTO TEARS / GONIOVISC) 2.5 % ophthalmic solution Place 1 drop into both eyes daily.     letrozole (FEMARA) 2.5 MG tablet TAKE 1 TABLET BY MOUTH EVERY DAY 90 tablet 4   levothyroxine (SYNTHROID) 75 MCG tablet Take 1 tablet (75 mcg total) by mouth daily. 90 tablet 3   lisinopril (ZESTRIL) 20 MG tablet Take 20 mg by mouth in the morning and at bedtime. Per Dr Posey Pronto 01/2021     ondansetron (ZOFRAN ODT) 4 MG disintegrating tablet Take 1 tablet (4 mg total) by mouth every 8 (eight) hours as needed. 30 tablet 2   Polyethylene Glycol 3350 (MIRALAX PO) Take by mouth. Takes one capful daily     omeprazole (PRILOSEC) 10 MG capsule Take 1 capsule (10 mg total) by mouth daily. (Patient not taking: Reported on 04/08/2021) 90 capsule 0   No current facility-administered medications for this visit.    Allergies as of 04/08/2021 - Review Complete 04/08/2021  Allergen Reaction Noted   Amlodipine Other (See Comments) 10/24/2015   Isosorbide nitrate Nausea Only 06/19/2014   Sulfonamide derivatives Nausea Only and Rash 12/19/2009   Terazosin Hives and Nausea Only 06/10/2013    Family History  Problem Relation Age of Onset   Thyroid disease Mother    Stroke Father    Hypertension Brother    Lung cancer Maternal Uncle    Cancer Maternal Grandmother        Liver   Lung cancer Maternal Uncle    Hypertension Daughter    Hypercholesterolemia Daughter    Hypercholesterolemia Daughter     Social History   Socioeconomic History   Marital status: Widowed    Spouse name: Not on file   Number  of children: 2   Years of education: 40   Highest education level: Not on file  Occupational History   Occupation: Retired  Tobacco Use   Smoking status: Never   Smokeless tobacco: Never  Vaping Use   Vaping Use: Never used  Substance and Sexual Activity   Alcohol use: No   Drug use: No   Sexual activity: Never  Other Topics Concern   Not on file  Social History Narrative   Lives alone   Ambidextrous (writes with left hand).   No caffeine use.   Social Determinants of Health   Financial Resource Strain: Not on file  Food Insecurity: Not on file  Transportation Needs: Not on file  Physical Activity: Not on  file  Stress: Not on file  Social Connections: Not on file    Review of Systems: Gen: Denies fever, chills, anorexia. Denies fatigue, weakness, weight loss.  CV: Denies chest pain, palpitations, syncope, peripheral edema, and claudication. Resp: Denies dyspnea at rest, cough, wheezing, coughing up blood, and pleurisy. GI: Denies vomiting blood, jaundice, and fecal incontinence. Denies dysphagia or odynophagia. Endorses ongoing nausea and vomiting daily. Endorses dark stools r/t iron.  Derm: Denies rash, itching, dry skin Psych: Denies depression, anxiety, memory loss, confusion. No homicidal or suicidal ideation.  Heme: Denies bleeding, and enlarged lymph nodes. Endorses easy bruising.   Physical Exam: BP (!) 181/55 (BP Location: Right Arm, Patient Position: Sitting, Cuff Size: Normal)   Pulse 61   Temp 98.6 F (37 C)   Ht 5\' 5"  (1.651 m)   Wt 106 lb 11.2 oz (48.4 kg)   BMI 17.76 kg/m  General:   Alert and oriented. No distress noted. Pleasant and cooperative.  Head:  Normocephalic and atraumatic. Eyes:  Conjuctiva clear without scleral icterus. Mouth:  Oral mucosa pink and moist. Good dentition. No lesions. Heart: Normal rate and rhythm, s1 and s2 heart sounds present.  Lungs: Clear lung sounds in all lobes. Respirations equal and unlabored. Abdomen:  +BS,  soft, non-tender and non-distended. No rebound or guarding. No HSM or masses noted. Derm: No palmar erythema or jaundice Msk:  Symmetrical without gross deformities. Normal posture. Extremities:  Without edema. Neurologic:  Alert and  oriented x4 Psych:  Alert and cooperative. Normal mood and affect.  ASSESSMENT: ALEESIA HENNEY is a 87 y.o. female presenting today for follow up of constipation and ongoing nausea and vomiting.  Constipation has improved. Patient is doing 1 capful of miralax nightly with 2-3 small BMs per day that she states typically feels sufficient. She has tried 2 capfuls per day which helped her have a large BM in the mornings but she thinks she had some subsequent diarrhea later in the day. We discussed that as long as she is not having more than 3 BMs per day, she can increase to 1.2-2 capfuls of miralax per day to ensure she is emptying her bowels sufficiently.  Patient continues to experience ongoing nausea and some intermittent vomiting. This has been occurring for the past 3-4 months. She takes zofran once daily with some relief of nausea, however, the thought of food sometimes induces nausea/vomiting. She is only able to take a few bites of food at each meal due to lack of appetite. She has lost approximately 9 pounds over the past year. Patient had CT abd/pelvis w/o contrast while in the ED in June which showed some uncomplicated cholelithiasis. Patient denies any abdominal pain, cramping or postprandial nausea or RUQ pain. Patient and daughter are concerned that patient's gallbladder is the cause of her symptoms, however, we discussed that while she did have incidental finding of gallstones on CT in June, her symptoms are not typical of cholecystitis or gallbladder related issues. LFTs in June 2022 were WNL. We discussed that an EGD would be the best way to proceed in order to further evaluate her weight loss, nausea and vomiting. We can proceed with further evaluation  (surgical referral for cholelithiasis) if EGD is unrevealing. Patient and daughter concerned about EGD due to possible risk (bleeding), we discussed risks vs benefits of procedure, all questions answered, patient and daughter are agreeable to proceed with EGD at this time.   We also Discussed doing a colonoscopy at time of EGD, given  that patient's last one was in 2016, and she is having weight loss/ongoing constipation, however, patient and daughter did not want to proceed with colonoscopy at this time, which I think is reasonable given lack of lower GI red flag symptoms. We can readdress need for this depending what EGD shows.   PLAN:  Schedule EGD 2. Continue zofran as needed for n/v 3. Continue miralax, can increase up to 2 capfuls a day 4. Can do boost or ensure shakes to help with nutrition since appetite is low  Patient not currently anticoagulated, on antiplatelet therapy, Aspirin or antidiabetic agents.   Follow Up: 3 months  Case discussed with Dr. Jenetta Downer who is in agreement with plan of care, as outlined above.   Jillianna Stanek L. Alver Sorrow, MSN, APRN, AGNP-C Adult-Gerontology Nurse Practitioner [facility]

## 2021-04-08 NOTE — H&P (View-Only) (Signed)
Referring Provider: [name], DO Primary Care Physician:  [name], DO Primary GI Physician: Jenetta Downer   Chief Complaint  Patient presents with   Constipation    Pt arrives with daughter [name]. Pt states no appetite, losing weight, nausea when taking about food, has 2 -3 small stools a day when taking miralax daily, has some dark stools. Taking iron bid. Has some  vomiting.    HPI:   [name] is a 87 y.o. female with past medical history of asthma, a fib, BCC, CKD, HLD, HTN, hypothyroidism who presents today for follow up of constipation/nausea and vomiting.   Nausea/vomiting: reports that these symptoms have been ongoing for the past 3-4 months. states that she takes zofran once daily, if she does not take it she vomits.  She states that the thought of food makes her nauseated. Daughter states she only eats a few bites at a time. She denies early satiety but states that she just does not have an appetite. Denies hematochezia, hematemesis, does endorse continued dark stools r/t iron supplementation. Denies coffee ground emesis.denies bloating or gas. States sometimes nausea is improved with BMs. She denies any abdominal pain, cramping, reflux or regurgitation. Patient and her daughter are concerned it could be her gallbladder.   Constipation: states constipation has improved some. doing miralax 1 capful nightly, states, 2-3 small BMs per day, states sometimes she feels as though she is emptying efficiently and sometimes having a BM improves her nausea, however, she states she does not have regular BMs without taking miralax, tried taking 2 capfuls but this gave her diarrhea.   Denies NSAID use/alcohol.   Last Colonoscopy:(2016) diverticulosis, hemorrhoids  Last Endoscopy:(2016) normal  Recommendations:  Proceed with EGD for further evaluation of weight loss, nausea, vomiting  Past Medical History:  Diagnosis Date   Anemia    Arthritis    Asthma     Atrial fibrillation ([place])    Basal cell carcinoma 02/06/2009   Left ear targus- ([facility])   Basal cell carcinoma 09/28/2008   Right back-(CX35FU)   CKD (chronic kidney disease), stage III (HCC)    Heart murmur    Hyperlipidemia    Hypertension    Hypothyroidism    Nodule of right lung    Right upper lobe   PONV (postoperative nausea and vomiting)    Renal insufficiency    Chronic   Renal vascular disease    Right renal artery stenosis ([place]) 05/09/2015   Squamous cell carcinoma of skin 09/07/2019   KA-Left shin-txpbx   Vertigo     Past Surgical History:  Procedure Laterality Date   BREAST LUMPECTOMY WITH RADIOACTIVE SEED LOCALIZATION Right 04/08/2018   Procedure: BREAST LUMPECTOMY WITH RADIOACTIVE SEED LOCALIZATION;  Surgeon: Rolm Bookbinder, MD;  Location: [facility];  Service: General;  Laterality: Right;   COLONOSCOPY  2016   diverticulosis, hemorrhoids   ESOPHAGOGASTRODUODENOSCOPY  2016   normal   IR GENERIC HISTORICAL  08/29/2016   IR US GUIDE VASC ACCESS RIGHT 08/29/2016 [facility]   IR GENERIC HISTORICAL  08/29/2016   IR RENAL BILAT S&I MOD SED 08/29/2016 [facility]   KNEE ARTHROSCOPY     2019   THYROIDECTOMY, PARTIAL Right 1981   middle lobe removed 1st, then right lobe removed 7-8 years later (approx. 1988)    Current Outpatient Medications  Medication Sig Dispense Refill   acetaminophen (TYLENOL) 325 MG tablet Take 325 mg by mouth daily as needed for moderate pain or headache.  atenolol-chlorthalidone (TENORETIC) 50-25 MG tablet TAKE 1 TABLET BY MOUTH EVERY DAY 90 tablet 0   atorvastatin (LIPITOR) 20 MG tablet TAKE 1/2 TABLET BY MOUTH EVERY DAY 45 tablet 0   calcium-vitamin D (OSCAL WITH D) 500-200 MG-UNIT per tablet Take 2 tablets by mouth at bedtime.     cholecalciferol (VITAMIN D) 1000 units tablet Take 1,000 Units by mouth daily.     diclofenac Sodium (VOLTAREN) 1 % GEL Apply 2 g topically 4 (four) times daily. 100 g 0   ferrous sulfate 325 (65 FE) MG  tablet Take 325 mg by mouth 2 (two) times daily with a meal.     flecainide (TAMBOCOR) 50 MG tablet TAKE 1 TABLET (50 MG TOTAL) BY MOUTH 2 (TWO) TIMES DAILY. NEED OV. 180 tablet 0   fluticasone (FLONASE) 50 MCG/ACT nasal spray Place 1 spray into both nostrils 2 (two) times daily. 16 g 5   hydrALAZINE (APRESOLINE) 100 MG tablet Take 100 mg by mouth 2 (two) times daily.     hydroxypropyl methylcellulose / hypromellose (ISOPTO TEARS / GONIOVISC) 2.5 % ophthalmic solution Place 1 drop into both eyes daily.     letrozole (FEMARA) 2.5 MG tablet TAKE 1 TABLET BY MOUTH EVERY DAY 90 tablet 4   levothyroxine (SYNTHROID) 75 MCG tablet Take 1 tablet (75 mcg total) by mouth daily. 90 tablet 3   lisinopril (ZESTRIL) 20 MG tablet Take 20 mg by mouth in the morning and at bedtime. Per Dr Posey Pronto 01/2021     ondansetron (ZOFRAN ODT) 4 MG disintegrating tablet Take 1 tablet (4 mg total) by mouth every 8 (eight) hours as needed. 30 tablet 2   Polyethylene Glycol 3350 (MIRALAX PO) Take by mouth. Takes one capful daily     omeprazole (PRILOSEC) 10 MG capsule Take 1 capsule (10 mg total) by mouth daily. (Patient not taking: Reported on 04/08/2021) 90 capsule 0   No current facility-administered medications for this visit.    Allergies as of 04/08/2021 - Review Complete 04/08/2021  Allergen Reaction Noted   Amlodipine Other (See Comments) 10/24/2015   Isosorbide nitrate Nausea Only 06/19/2014   Sulfonamide derivatives Nausea Only and Rash 12/19/2009   Terazosin Hives and Nausea Only 06/10/2013    Family History  Problem Relation Age of Onset   Thyroid disease Mother    Stroke Father    Hypertension Brother    Lung cancer Maternal Uncle    Cancer Maternal Grandmother        Liver   Lung cancer Maternal Uncle    Hypertension Daughter    Hypercholesterolemia Daughter    Hypercholesterolemia Daughter     Social History   Socioeconomic History   Marital status: Widowed    Spouse name: Not on file   Number  of children: 2   Years of education: 33   Highest education level: Not on file  Occupational History   Occupation: Retired  Tobacco Use   Smoking status: Never   Smokeless tobacco: Never  Vaping Use   Vaping Use: Never used  Substance and Sexual Activity   Alcohol use: No   Drug use: No   Sexual activity: Never  Other Topics Concern   Not on file  Social History Narrative   Lives alone   Ambidextrous (writes with left hand).   No caffeine use.   Social Determinants of Health   Financial Resource Strain: Not on file  Food Insecurity: Not on file  Transportation Needs: Not on file  Physical Activity: Not on  file  Stress: Not on file  Social Connections: Not on file    Review of Systems: Gen: Denies fever, chills, anorexia. Denies fatigue, weakness, weight loss.  CV: Denies chest pain, palpitations, syncope, peripheral edema, and claudication. Resp: Denies dyspnea at rest, cough, wheezing, coughing up blood, and pleurisy. GI: Denies vomiting blood, jaundice, and fecal incontinence. Denies dysphagia or odynophagia. Endorses ongoing nausea and vomiting daily. Endorses dark stools r/t iron.  Derm: Denies rash, itching, dry skin Psych: Denies depression, anxiety, memory loss, confusion. No homicidal or suicidal ideation.  Heme: Denies bleeding, and enlarged lymph nodes. Endorses easy bruising.   Physical Exam: BP (!) 181/55 (BP Location: Right Arm, Patient Position: Sitting, Cuff Size: Normal)   Pulse 61   Temp 98.6 F (37 C)   Ht 5\' 5"  (1.651 m)   Wt 106 lb 11.2 oz (48.4 kg)   BMI 17.76 kg/m  General:   Alert and oriented. No distress noted. Pleasant and cooperative.  Head:  Normocephalic and atraumatic. Eyes:  Conjuctiva clear without scleral icterus. Mouth:  Oral mucosa pink and moist. Good dentition. No lesions. Heart: Normal rate and rhythm, s1 and s2 heart sounds present.  Lungs: Clear lung sounds in all lobes. Respirations equal and unlabored. Abdomen:  +BS,  soft, non-tender and non-distended. No rebound or guarding. No HSM or masses noted. Derm: No palmar erythema or jaundice Msk:  Symmetrical without gross deformities. Normal posture. Extremities:  Without edema. Neurologic:  Alert and  oriented x4 Psych:  Alert and cooperative. Normal mood and affect.  ASSESSMENT: KYNDELL ZEISER is a 87 y.o. female presenting today for follow up of constipation and ongoing nausea and vomiting.  Constipation has improved. Patient is doing 1 capful of miralax nightly with 2-3 small BMs per day that she states typically feels sufficient. She has tried 2 capfuls per day which helped her have a large BM in the mornings but she thinks she had some subsequent diarrhea later in the day. We discussed that as long as she is not having more than 3 BMs per day, she can increase to 1.2-2 capfuls of miralax per day to ensure she is emptying her bowels sufficiently.  Patient continues to experience ongoing nausea and some intermittent vomiting. This has been occurring for the past 3-4 months. She takes zofran once daily with some relief of nausea, however, the thought of food sometimes induces nausea/vomiting. She is only able to take a few bites of food at each meal due to lack of appetite. She has lost approximately 9 pounds over the past year. Patient had CT abd/pelvis w/o contrast while in the ED in June which showed some uncomplicated cholelithiasis. Patient denies any abdominal pain, cramping or postprandial nausea or RUQ pain. Patient and daughter are concerned that patient's gallbladder is the cause of her symptoms, however, we discussed that while she did have incidental finding of gallstones on CT in June, her symptoms are not typical of cholecystitis or gallbladder related issues. LFTs in June 2022 were WNL. We discussed that an EGD would be the best way to proceed in order to further evaluate her weight loss, nausea and vomiting. We can proceed with further evaluation  (surgical referral for cholelithiasis) if EGD is unrevealing. Patient and daughter concerned about EGD due to possible risk (bleeding), we discussed risks vs benefits of procedure, all questions answered, patient and daughter are agreeable to proceed with EGD at this time.   We also Discussed doing a colonoscopy at time of EGD, given  that patient's last one was in 2016, and she is having weight loss/ongoing constipation, however, patient and daughter did not want to proceed with colonoscopy at this time, which I think is reasonable given lack of lower GI red flag symptoms. We can readdress need for this depending what EGD shows.   PLAN:  Schedule EGD 2. Continue zofran as needed for n/v 3. Continue miralax, can increase up to 2 capfuls a day 4. Can do boost or ensure shakes to help with nutrition since appetite is low  Patient not currently anticoagulated, on antiplatelet therapy, Aspirin or antidiabetic agents.   Follow Up: 3 months  Case discussed with Dr. Jenetta Downer who is in agreement with plan of care, as outlined above.   Verlean Allport L. Alver Sorrow, MSN, APRN, AGNP-C Adult-Gerontology Nurse Practitioner [facility]

## 2021-04-08 NOTE — Patient Instructions (Signed)
We will get you scheduled for an EGD to further evaluate your weight loss, nausea and vomiting. You may continue taking zofran as needed for nausea. Continue miralax, you can increase to 2 capsfuls, you may need to adjust your dose based on how many BMs you are having per day. Continue to try and eat small meals throughout the day, you can also do boost or ensure shakes to help with your nutrition, if you can tolerate these.  Follow up in 3 months.

## 2021-04-08 NOTE — Telephone Encounter (Signed)
LeighAnn [employer], CMA  

## 2021-04-09 ENCOUNTER — Other Ambulatory Visit: Payer: Self-pay

## 2021-04-09 ENCOUNTER — Encounter (HOSPITAL_COMMUNITY): Payer: Self-pay | Admitting: Gastroenterology

## 2021-04-09 ENCOUNTER — Ambulatory Visit (HOSPITAL_COMMUNITY)
Admission: RE | Admit: 2021-04-09 | Discharge: 2021-04-09 | Disposition: A | Discharge status: Home / Self Care | Payer: PPO | Attending: Gastroenterology | Admitting: Gastroenterology

## 2021-04-09 ENCOUNTER — Ambulatory Visit (HOSPITAL_COMMUNITY): Payer: PPO | Admitting: Anesthesiology

## 2021-04-09 ENCOUNTER — Encounter (HOSPITAL_COMMUNITY)
Admission: RE | Disposition: A | Discharge status: Home / Self Care | Payer: Self-pay | Source: Home / Self Care | Attending: Gastroenterology

## 2021-04-09 DIAGNOSIS — Z8249 Family history of ischemic heart disease and other diseases of the circulatory system: Secondary | ICD-10-CM | POA: Insufficient documentation

## 2021-04-09 DIAGNOSIS — Z801 Family history of malignant neoplasm of trachea, bronchus and lung: Secondary | ICD-10-CM | POA: Diagnosis not present

## 2021-04-09 DIAGNOSIS — Z79899 Other long term (current) drug therapy: Secondary | ICD-10-CM | POA: Diagnosis not present

## 2021-04-09 DIAGNOSIS — K5900 Constipation, unspecified: Secondary | ICD-10-CM | POA: Insufficient documentation

## 2021-04-09 DIAGNOSIS — E039 Hypothyroidism, unspecified: Secondary | ICD-10-CM | POA: Insufficient documentation

## 2021-04-09 DIAGNOSIS — K2289 Other specified disease of esophagus: Secondary | ICD-10-CM | POA: Diagnosis not present

## 2021-04-09 DIAGNOSIS — R634 Abnormal weight loss: Secondary | ICD-10-CM | POA: Insufficient documentation

## 2021-04-09 DIAGNOSIS — I129 Hypertensive chronic kidney disease with stage 1 through stage 4 chronic kidney disease, or unspecified chronic kidney disease: Secondary | ICD-10-CM | POA: Insufficient documentation

## 2021-04-09 DIAGNOSIS — Z681 Body mass index (BMI) 19 or less, adult: Secondary | ICD-10-CM | POA: Diagnosis not present

## 2021-04-09 DIAGNOSIS — R112 Nausea with vomiting, unspecified: Secondary | ICD-10-CM | POA: Insufficient documentation

## 2021-04-09 DIAGNOSIS — K3189 Other diseases of stomach and duodenum: Secondary | ICD-10-CM | POA: Diagnosis not present

## 2021-04-09 DIAGNOSIS — N183 Chronic kidney disease, stage 3 unspecified: Secondary | ICD-10-CM | POA: Diagnosis not present

## 2021-04-09 DIAGNOSIS — B3781 Candidal esophagitis: Secondary | ICD-10-CM | POA: Insufficient documentation

## 2021-04-09 DIAGNOSIS — Z8349 Family history of other endocrine, nutritional and metabolic diseases: Secondary | ICD-10-CM | POA: Diagnosis not present

## 2021-04-09 DIAGNOSIS — I4891 Unspecified atrial fibrillation: Secondary | ICD-10-CM | POA: Diagnosis not present

## 2021-04-09 DIAGNOSIS — K449 Diaphragmatic hernia without obstruction or gangrene: Secondary | ICD-10-CM | POA: Insufficient documentation

## 2021-04-09 DIAGNOSIS — J45909 Unspecified asthma, uncomplicated: Secondary | ICD-10-CM | POA: Insufficient documentation

## 2021-04-09 DIAGNOSIS — Z800 Family history of malignant neoplasm of digestive organs: Secondary | ICD-10-CM | POA: Insufficient documentation

## 2021-04-09 DIAGNOSIS — E785 Hyperlipidemia, unspecified: Secondary | ICD-10-CM | POA: Diagnosis not present

## 2021-04-09 HISTORY — PX: BIOPSY: SHX5522

## 2021-04-09 HISTORY — PX: ESOPHAGOGASTRODUODENOSCOPY (EGD) WITH PROPOFOL: SHX5813

## 2021-04-09 LAB — POCT I-STAT, CHEM 8
BUN: 20 mg/dL (ref 8–23)
Calcium, Ion: 1.17 mmol/L (ref 1.15–1.40)
Chloride: 90 mmol/L — ABNORMAL LOW (ref 98–111)
Creatinine, Ser: 1.6 mg/dL — ABNORMAL HIGH (ref 0.44–1.00)
Glucose, Bld: 118 mg/dL — ABNORMAL HIGH (ref 70–99)
HCT: 31 % — ABNORMAL LOW (ref 36.0–46.0)
Hemoglobin: 10.5 g/dL — ABNORMAL LOW (ref 12.0–15.0)
Potassium: 3.9 mmol/L (ref 3.5–5.1)
Sodium: 130 mmol/L — ABNORMAL LOW (ref 135–145)
TCO2: 33 mmol/L — ABNORMAL HIGH (ref 22–32)

## 2021-04-09 SURGERY — ESOPHAGOGASTRODUODENOSCOPY (EGD) WITH PROPOFOL
Anesthesia: General

## 2021-04-09 MED ORDER — LIDOCAINE HCL (CARDIAC) PF 100 MG/5ML IV SOSY
PREFILLED_SYRINGE | INTRAVENOUS | Status: DC | PRN
  Administered 2021-04-09: 50 mg via INTRAVENOUS

## 2021-04-09 MED ORDER — PROPOFOL 10 MG/ML IV BOLUS
INTRAVENOUS | Status: DC | PRN
  Administered 2021-04-09: 30 mg via INTRAVENOUS
  Administered 2021-04-09: 80 mg via INTRAVENOUS

## 2021-04-09 MED ORDER — LACTATED RINGERS IV SOLN: INTRAVENOUS | Status: DC

## 2021-04-09 NOTE — Op Note (Signed)
[facility] Patient Name: [name] Procedure Date: 04/09/2021 12:13 PM MRN: [id number] Date of Birth: 08-Apr-1934 Attending MD: [name] ,  CSN: [id number] Age: 87 Admit Type: Outpatient Procedure:                Upper GI endoscopy Indications:              Nausea with vomiting, Weight loss Providers:                [name], Gwenlyn Fudge, RN, Aram Candela Referring MD:              Medicines:                Monitored Anesthesia Care Complications:            No immediate complications. Estimated Blood Loss:     Estimated blood loss: none. Procedure:                Pre-Anesthesia Assessment:                           - Prior to the procedure, a History and Physical                            was performed, and patient medications, allergies                            and sensitivities were reviewed. The patient's                            tolerance of previous anesthesia was reviewed.                           - The risks and benefits of the procedure and the                            sedation options and risks were discussed with the                            patient. All questions were answered and informed                            consent was obtained.                           - ASA Grade Assessment: III - A patient with severe                            systemic disease.                           After obtaining informed consent, the endoscope was                            passed under direct vision. Throughout the                            procedure, the patient's  blood pressure, pulse, and                            oxygen saturations were monitored continuously. The                            GIF-H190 ([id number]) scope was introduced through the                            mouth, and advanced to the second part of duodenum.                            The upper GI endoscopy was accomplished without                            difficulty. The patient  tolerated the procedure                            well. Scope In: 12:28:11 PM Scope Out: 12:34:49 PM Total Procedure Duration: 0 hours 6 minutes 38 seconds  Findings:      White nummular lesions were noted in the middle third of the esophagus.       Biopsies were taken with a cold forceps for histology.      A 1 cm hiatal hernia was present.      The entire examined stomach was normal.      Diffuse mucosal variance characterized by melanosis was found in the       entire duodenum. Impression:               - White nummular lesions in esophageal mucosa.                            Biopsied.                           - 1 cm hiatal hernia.                           - Normal stomach.                           - Mucosal variant in the duodenum. Moderate Sedation:      Per Anesthesia Care Recommendation:           - Discharge patient to home (ambulatory).                           - Resume previous diet. Try to take protein shakes                            at least 2 times per day and drink water frequently.                           - Await pathology results.                           - Continue with  Zofran as needed for                            nausea/vomiting. Procedure Code(s):        --- Professional ---                           [phone], Esophagogastroduodenoscopy, flexible,                            transoral; with biopsy, single or multiple Diagnosis Code(s):        --- Professional ---                           K22.8, Other specified diseases of esophagus                           K44.9, Diaphragmatic hernia without obstruction or                            gangrene                           K31.89, Other diseases of stomach and duodenum                           R11.2, Nausea with vomiting, unspecified                           R63.4, Abnormal weight loss CPT copyright 2019 American Medical Association. All rights reserved. The codes documented in this report are preliminary  and upon coder review may  be revised to meet current compliance requirements. [name], MD [name],  04/09/2021 12:44:52 PM This report has been signed electronically. Number of Addenda: 0

## 2021-04-09 NOTE — Anesthesia Procedure Notes (Signed)
Date/Time: 04/09/2021 12:30 PM Performed by: Orlie Dakin, CRNA Pre-anesthesia Checklist: Patient identified, Emergency Drugs available, Suction available and Patient being monitored Patient Re-evaluated:Patient Re-evaluated prior to induction Oxygen Delivery Method: Nasal cannula Induction Type: IV induction Placement Confirmation: positive ETCO2

## 2021-04-09 NOTE — Discharge Instructions (Signed)
Discharge patient to home (ambulatory).  - Resume previous diet. Try to take protein shakes at least 2 times per day and drink water frequently. - Await pathology results.  - Continue with Zofran as needed for nausea/vomiting.

## 2021-04-09 NOTE — Anesthesia Preprocedure Evaluation (Signed)
Anesthesia Evaluation  Patient identified by MRN, date of birth, ID band Patient awake    Reviewed: Allergy & Precautions, NPO status , Patient's Chart, lab work & pertinent test results, reviewed documented beta blocker date and time   History of Anesthesia Complications (+) PONV and history of anesthetic complications  Airway Mallampati: II  TM Distance: >3 FB Neck ROM: Full    Dental  (+) Dental Advisory Given   Pulmonary asthma ,    Pulmonary exam normal breath sounds clear to auscultation       Cardiovascular Exercise Tolerance: Poor hypertension, Pt. on medications and Pt. on home beta blockers + Peripheral Vascular Disease  Normal cardiovascular exam+ dysrhythmias Atrial Fibrillation + Valvular Problems/Murmurs  Rhythm:Regular Rate:Normal     Neuro/Psych  Headaches, negative psych ROS   GI/Hepatic GERD (nausea/vomiting,dysphagia)  Medicated and Poorly Controlled,  Endo/Other  Hypothyroidism   Renal/GU Renal InsufficiencyRenal disease     Musculoskeletal  (+) Arthritis ,   Abdominal   Peds  Hematology  (+) anemia ,   Anesthesia Other Findings   Reproductive/Obstetrics                            Anesthesia Physical Anesthesia Plan  ASA: 3  Anesthesia Plan: General   Post-op Pain Management:    Induction: Intravenous  PONV Risk Score and Plan: Propofol infusion  Airway Management Planned: Nasal Cannula and Natural Airway  Additional Equipment:   Intra-op Plan:   Post-operative Plan:   Informed Consent: I have reviewed the patients History and Physical, chart, labs and discussed the procedure including the risks, benefits and alternatives for the proposed anesthesia with the patient or authorized representative who has indicated his/her understanding and acceptance.     Dental advisory given  Plan Discussed with: CRNA and Surgeon  Anesthesia Plan Comments:          Anesthesia Quick Evaluation

## 2021-04-09 NOTE — Interval H&P Note (Signed)
History and Physical Interval Note:  04/09/2021 12:22 PM 87 y.o. female with past medical history of asthma, a fib, BCC, CKD, HLD, HTN, hypothyroidism, coming to the hospital for evaluation of recurrent nausea and occasional episodes of vomiting.  Patient reports that she has presented intermittent episodes that improved when she is taking Zofran.  Denies any abdominal pain, fever, chills, constipation or diarrhea.  States that she has not been drinking fluids as much as possible.  BP (!) 201/66   Pulse (!) 58   Temp 98.1 F (36.7 C) (Oral)   Resp 20   Ht 5\' 5"  (1.651 m)   Wt 48.4 kg   SpO2 96%   BMI 17.76 kg/m  GENERAL: The patient is AO x3, in no acute distress. HEENT: Head is normocephalic and atraumatic. EOMI are intact. Mouth is well hydrated and without lesions. NECK: Supple. No masses LUNGS: Clear to auscultation. No presence of rhonchi/wheezing/rales. Adequate chest expansion HEART: RRR, normal s1 and s2. ABDOMEN: Soft, nontender, no guarding, no peritoneal signs, and nondistended. BS +. No masses. EXTREMITIES: Without any cyanosis, clubbing, rash, lesions or edema. NEUROLOGIC: AOx3, no focal motor deficit. SKIN: no jaundice, no rashes   [name]  has presented today for surgery, with the diagnosis of Weight Loss.  The various methods of treatment have been discussed with the patient and family. After consideration of risks, benefits and other options for treatment, the patient has consented to  Procedure(s) with comments: ESOPHAGOGASTRODUODENOSCOPY (EGD) WITH PROPOFOL (N/A) - 12:45 as a surgical intervention.  The patient's history has been reviewed, patient examined, no change in status, stable for surgery.  I have reviewed the patient's chart and labs.  Questions were answered to the patient's satisfaction.     [name]

## 2021-04-09 NOTE — Transfer of Care (Signed)
Immediate Anesthesia Transfer of Care Note  Patient: [name]  Procedure(s) Performed: ESOPHAGOGASTRODUODENOSCOPY (EGD) WITH PROPOFOL BIOPSY  Patient Location: Short Stay  Anesthesia Type:General  Level of Consciousness: awake, alert  and oriented  Airway & Oxygen Therapy: Patient Spontanous Breathing  Post-op Assessment: Report given to RN, Post -op Vital signs reviewed and stable and Patient moving all extremities X 4  Post vital signs: Reviewed and stable  Last Vitals:  Vitals Value Taken Time  BP 121/51 04/09/21 1240  Temp 36.7 C 04/09/21 1240  Pulse 59 04/09/21 1240  Resp 20 04/09/21 1240  SpO2 100 % 04/09/21 1240    Last Pain:  Vitals:   04/09/21 1240  TempSrc: Axillary  PainSc: 0-No pain         Complications: No notable events documented.

## 2021-04-09 NOTE — Anesthesia Postprocedure Evaluation (Signed)
Anesthesia Post Note  Patient: [name]  Procedure(s) Performed: ESOPHAGOGASTRODUODENOSCOPY (EGD) WITH PROPOFOL BIOPSY  Patient location during evaluation: Phase II Anesthesia Type: General Level of consciousness: awake and alert and oriented Pain management: pain level controlled Vital Signs Assessment: post-procedure vital signs reviewed and stable Respiratory status: spontaneous breathing and respiratory function stable Cardiovascular status: blood pressure returned to baseline and stable Postop Assessment: no apparent nausea or vomiting Anesthetic complications: no   No notable events documented.   Last Vitals:  Vitals:   04/09/21 1240 04/09/21 1245  BP: (!) 121/51 (!) 145/59  Pulse: (!) 59   Resp: 20   Temp: 36.7 C   SpO2: 100%     Last Pain:  Vitals:   04/09/21 1240  TempSrc: Axillary  PainSc: 0-No pain                 Vanya Carberry C Jenavi Beedle

## 2021-04-11 LAB — SURGICAL PATHOLOGY

## 2021-04-12 ENCOUNTER — Other Ambulatory Visit (INDEPENDENT_AMBULATORY_CARE_PROVIDER_SITE_OTHER): Payer: Self-pay | Admitting: Gastroenterology

## 2021-04-12 ENCOUNTER — Telehealth (INDEPENDENT_AMBULATORY_CARE_PROVIDER_SITE_OTHER): Payer: Self-pay

## 2021-04-12 DIAGNOSIS — B3781 Candidal esophagitis: Secondary | ICD-10-CM

## 2021-04-12 MED ORDER — FLUCONAZOLE 50 MG PO TABS: 200.0000 mg | ORAL_TABLET | Freq: Every day | ORAL | 0 refills | Status: AC

## 2021-04-12 NOTE — Telephone Encounter (Signed)
Prescription sent to pharmacy  Thanks!

## 2021-04-12 NOTE — Telephone Encounter (Signed)
Patient called today stating she received a call yesterday that a antifungal would be sent to her local [facility] in [place].She states this was not done. Per results fluconazole was supposed to be sent in for the patient. Please advise.

## 2021-04-14 NOTE — Progress Notes (Signed)
[facility] [address], [place] [address]   Patient Care Team: [name], DO as PCP - General (Family Medicine) Minus Breeding, MD as PCP - Cardiology (Cardiology) Rolm Bookbinder, MD as Consulting Physician (General Surgery) Eppie Gibson, MD as Attending Physician (Radiation Oncology) Truitt Merle, MD as Consulting Physician (Hematology) Lavonna Monarch, MD as Consulting Physician (Dermatology) Warren Danes, PA-C as Physician Assistant (Dermatology)  SUMMARY OF ONCOLOGIC HISTORY: Oncology History Overview Note  Cancer Staging Malignant neoplasm of lower-outer quadrant of right breast of female, estrogen receptor positive ([place]) Staging form: Breast, AJCC 8th Edition - Clinical stage from 02/22/2018: Stage IA (cT1c, cN0, cM0, G1, ER+, PR+, HER2-) - Signed by Truitt Merle, MD on 03/02/2018    Malignant neoplasm of lower-outer quadrant of right breast of female, estrogen receptor positive ([place])  02/12/2018 Mammogram   She had routine screening bilateral mammography on 02/12/2018 at [facility] with results showing: indeterminate irregular mass in the right breast.    02/16/2018 Mammogram   She underwent right diagnostic mammography with tomography and right breast ultrasonography at [facility] on 02/16/2018 showing: 1.1 cm irregular mass at the 8 O'clock position on the right breast   02/22/2018 Pathology Results   Diagnosis 1. Breast, right, needle core biopsy, lateral, 8 o'clock - INVASIVE AND IN SITU DUCTAL CARCINOMA. - SEE COMMENT. 2. Breast, right, needle core biopsy, 11 o'clock - FIBROCYSTIC CHANGES. - USUAL DUCTAL HYPERPLASIA. - THERE IS NO EVIDENCE OF MALIGNANCY.   02/22/2018 Receptors her2   IMMUNOHISTOCHEMICAL AND MORPHOMETRIC ANALYSIS PERFORMED MANUALLY Estrogen Receptor: 100%, POSITIVE, STRONG STAINING INTENSITY Progesterone Receptor: 40%, POSITIVE, MODERATE STAINING INTENSITY Proliferation Marker Ki67: 1% Her2 Negative   02/22/2018 Cancer Staging    Staging form: Breast, AJCC 8th Edition - Clinical stage from 02/22/2018: Stage IA (cT1c, cN0, cM0, G1, ER+, PR+, HER2-) - Signed by Truitt Merle, MD on 03/02/2018    02/25/2018 Initial Diagnosis   Malignant neoplasm of lower-outer quadrant of right breast of female, estrogen receptor positive ([place])     CHIEF COMPLIANT: Follow-up of right breast cancer and pancytopenia   INTERVAL HISTORY: [name] is a 87 y.o. female here today for follow up of her right breast cancer and pancytopenia. Her last visit was on 12/03/18 via telephone.   Today she reports feeling fair. She denies any new pains. She is taking Calcium and Vitamin D, and she reports she has been taking letrozole and tolerating it well.   REVIEW OF SYSTEMS:   Review of Systems  Constitutional:  Positive for appetite change (25%) and fatigue (depleted).  Respiratory:  Positive for shortness of breath (with exertion).   Gastrointestinal:  Positive for constipation and nausea.  Hematological:  Bruises/bleeds easily.  All other systems reviewed and are negative.  I have reviewed the past medical history, past surgical history, social history and family history with the patient and they are unchanged from previous note.   ALLERGIES:   is allergic to amlodipine, isosorbide nitrate, sulfonamide derivatives, and terazosin.   MEDICATIONS:  Current Outpatient Medications  Medication Sig Dispense Refill   acetaminophen (TYLENOL) 325 MG tablet Take 325 mg by mouth daily as needed for moderate pain or headache.     atenolol-chlorthalidone (TENORETIC) 50-25 MG tablet TAKE 1 TABLET BY MOUTH EVERY DAY 90 tablet 0   atorvastatin (LIPITOR) 20 MG tablet TAKE 1/2 TABLET BY MOUTH EVERY DAY 45 tablet 0   calcium-vitamin D (OSCAL WITH D) 500-200 MG-UNIT per tablet Take 2 tablets by mouth  at bedtime.     cholecalciferol (VITAMIN D) 1000 units tablet Take 1,000 Units by mouth daily.     diclofenac Sodium (VOLTAREN) 1 % GEL Apply 2 g topically  4 (four) times daily. 100 g 0   ferrous sulfate 325 (65 FE) MG tablet Take 325 mg by mouth 2 (two) times daily with a meal.     flecainide (TAMBOCOR) 50 MG tablet TAKE 1 TABLET (50 MG TOTAL) BY MOUTH 2 (TWO) TIMES DAILY. NEED OV. 180 tablet 0   fluconazole (DIFLUCAN) 50 MG tablet Take 4 tablets (200 mg total) by mouth daily for 21 days. Take 8 tablets on the first day, then take 4 tablets daily 88 tablet 0   fluticasone (FLONASE) 50 MCG/ACT nasal spray Place 1 spray into both nostrils 2 (two) times daily. 16 g 5   hydrALAZINE (APRESOLINE) 100 MG tablet Take 100 mg by mouth 2 (two) times daily.     hydroxypropyl methylcellulose / hypromellose (ISOPTO TEARS / GONIOVISC) 2.5 % ophthalmic solution Place 1 drop into both eyes daily.     letrozole (FEMARA) 2.5 MG tablet TAKE 1 TABLET BY MOUTH EVERY DAY 90 tablet 4   levothyroxine (SYNTHROID) 75 MCG tablet Take 1 tablet (75 mcg total) by mouth daily. 90 tablet 3   lisinopril (ZESTRIL) 20 MG tablet Take 20 mg by mouth in the morning and at bedtime. Per Dr Posey Pronto 01/2021     omeprazole (PRILOSEC) 10 MG capsule Take 1 capsule (10 mg total) by mouth daily. 90 capsule 0   ondansetron (ZOFRAN ODT) 4 MG disintegrating tablet Take 1 tablet (4 mg total) by mouth every 8 (eight) hours as needed. 30 tablet 2   Polyethylene Glycol 3350 (MIRALAX PO) Take by mouth. Takes one capful daily     No current facility-administered medications for this visit.     PHYSICAL EXAMINATION: Performance status (ECOG): 1 - Symptomatic but completely ambulatory  There were no vitals filed for this visit. Wt Readings from Last 3 Encounters:  04/09/21 106 lb 11.2 oz (48.4 kg)  04/08/21 106 lb 11.2 oz (48.4 kg)  03/08/21 108 lb (49 kg)   Physical Exam Vitals reviewed.  Constitutional:      Appearance: Normal appearance.  Cardiovascular:     Rate and Rhythm: Normal rate and regular rhythm.     Pulses: Normal pulses.     Heart sounds: Normal heart sounds.  Pulmonary:      Effort: Pulmonary effort is normal.     Breath sounds: Normal breath sounds.  Chest:  Breasts:    Right: No mass, skin change (LOQ lumpectomy scar within normal limits) or tenderness.     Left: No mass, skin change or tenderness.  Neurological:     General: No focal deficit present.     Mental Status: She is alert and oriented to person, place, and time.  Psychiatric:        Mood and Affect: Mood normal.        Behavior: Behavior normal.    Breast Exam Chaperone: [name]     LABORATORY DATA:  I have reviewed the data as listed CMP Latest Ref Rng & Units 04/09/2021 04/08/2021 02/20/2021  Glucose 70 - 99 mg/dL 118(H) 116(H) 105(H)  BUN 8 - 23 mg/dL 20 25(H) 27  Creatinine 0.44 - 1.00 mg/dL 1.60(H) 1.79(H) 1.41(H)  Sodium 135 - 145 mmol/L 130(L) 128(L) 135  Potassium 3.5 - 5.1 mmol/L 3.9 4.0 4.0  Chloride 98 - 111 mmol/L 90(L) 89(L) 94(L)  CO2 22 - 32 mmol/L - 32 27  Calcium 8.9 - 10.3 mg/dL - 9.2 9.4  Total Protein 6.5 - 8.1 g/dL - 7.3 -  Total Bilirubin 0.3 - 1.2 mg/dL - 0.5 -  Alkaline Phos 38 - 126 U/L - 58 -  AST 15 - 41 U/L - 24 -  ALT 0 - 44 U/L - 14 -   No results found for: [id number] Lab Results  Component Value Date   WBC 4.6 04/08/2021   HGB 10.5 (L) 04/09/2021   HCT 31.0 (L) 04/09/2021   MCV 87.7 04/08/2021   PLT 146 (L) 04/08/2021   NEUTROABS 3.3 04/08/2021    ASSESSMENT:  1.  Stage I right breast cancer: - Right lumpectomy on 04/08/2018, IDC, 1.1 cm, grade 1, margins negative, ER/PR positive, HER2 negative, Ki-67 1%. - Letrozole started on 05/28/2018.  She declined adjuvant radiation therapy.   PLAN:  1.  Stage I right breast cancer: - Breast examination today shows right lumpectomy scar is within normal limits.  No palpable masses. - Continue letrozole for 5 years.  She is tolerating it reasonably well. - Reviewed labs from 04/08/2021 which showed normal LFTs. - Mammogram on 03/19/2021 was normal. - RTC 6 months with repeat labs and exam.  2.   Normocytic anemia: - Anemia from CKD and relative iron deficiency.  CBC shows hemoglobin 10.7.  B12 level was normal. - Ferritin is 507 and percent saturation is 12. - No indication for parenteral iron therapy.  Continue oral iron supplements. - We will plan to repeat labs in 6 months.   Breast Cancer therapy associated bone loss: I have recommended calcium, Vitamin D and weight bearing exercises.  Orders placed this encounter:  No orders of the defined types were placed in this encounter.   The patient has a good understanding of the overall plan. She agrees with it. She will call with any problems that may develop before the next visit here.  Derek Jack, MD [facility] [phone]   I, [name], am acting as a scribe for Dr. Derek Jack.  I, Derek Jack MD, have reviewed the above documentation for accuracy and completeness, and I agree with the above.

## 2021-04-15 ENCOUNTER — Inpatient Hospital Stay (HOSPITAL_BASED_OUTPATIENT_CLINIC_OR_DEPARTMENT_OTHER): Payer: PPO | Admitting: Hematology

## 2021-04-15 ENCOUNTER — Encounter (HOSPITAL_COMMUNITY): Payer: Self-pay | Admitting: Hematology

## 2021-04-15 ENCOUNTER — Other Ambulatory Visit: Payer: Self-pay

## 2021-04-15 VITALS — BP 143/43 | HR 54 | Temp 96.9°F | Resp 16 | Wt 107.8 lb

## 2021-04-15 DIAGNOSIS — Z170 Estrogen receptor positive status [ER+]: Secondary | ICD-10-CM

## 2021-04-15 DIAGNOSIS — C50511 Malignant neoplasm of lower-outer quadrant of right female breast: Secondary | ICD-10-CM | POA: Diagnosis not present

## 2021-04-15 NOTE — Progress Notes (Signed)
Pt is taking Femara daily with no side effects.

## 2021-04-15 NOTE — Patient Instructions (Addendum)
[facility] Discharge Instructions  You were seen today by Dr. Delton Coombes. He went over your recent results. Please reach out to [place] to schedule your bone density scan. Dr. Delton Coombes will see you back in 6 months for labs and follow up.   Thank you for choosing [facility] [place] at [facility] to provide your oncology and hematology care.  To afford each patient quality time with our provider, please arrive at least 15 minutes before your scheduled appointment time.   If you have a lab appointment with the [facility] please come in thru the Main Entrance and check in at the main information desk  You need to re-schedule your appointment should you arrive 10 or more minutes late.  We strive to give you quality time with our providers, and arriving late affects you and other patients whose appointments are after yours.  Also, if you no show three or more times for appointments you may be dismissed from the clinic at the providers discretion.     Again, thank you for choosing [facility].  Our hope is that these requests will decrease the amount of time that you wait before being seen by our physicians.       _____________________________________________________________  Should you have questions after your visit to [facility], please contact our office at [phone] between the hours of 8:00 a.m. and 4:30 p.m.  Voicemails left after 4:00 p.m. will not be returned until the following business day.  For prescription refill requests, have your pharmacy contact our office and allow 72 hours.    Cancer Center Support Programs:   > Cancer Support Group  2nd Tuesday of the month 1pm-2pm, Journey Room

## 2021-04-18 ENCOUNTER — Encounter (HOSPITAL_COMMUNITY): Payer: Self-pay | Admitting: Gastroenterology

## 2021-04-23 ENCOUNTER — Other Ambulatory Visit: Payer: Self-pay

## 2021-04-23 ENCOUNTER — Other Ambulatory Visit (INDEPENDENT_AMBULATORY_CARE_PROVIDER_SITE_OTHER): Payer: PPO

## 2021-04-23 ENCOUNTER — Telehealth: Payer: Self-pay | Admitting: Family Medicine

## 2021-04-23 DIAGNOSIS — Z780 Asymptomatic menopausal state: Secondary | ICD-10-CM

## 2021-04-23 NOTE — Progress Notes (Signed)
dg 

## 2021-04-23 NOTE — Telephone Encounter (Signed)
Appt made for patient today 9/6

## 2021-04-26 ENCOUNTER — Ambulatory Visit (INDEPENDENT_AMBULATORY_CARE_PROVIDER_SITE_OTHER): Payer: PPO

## 2021-04-26 VITALS — Ht 65.0 in | Wt 108.0 lb

## 2021-04-26 DIAGNOSIS — Z780 Asymptomatic menopausal state: Secondary | ICD-10-CM | POA: Diagnosis not present

## 2021-04-26 DIAGNOSIS — M8589 Other specified disorders of bone density and structure, multiple sites: Secondary | ICD-10-CM | POA: Diagnosis not present

## 2021-04-26 DIAGNOSIS — Z0000 Encounter for general adult medical examination without abnormal findings: Secondary | ICD-10-CM

## 2021-04-26 NOTE — Patient Instructions (Signed)
[name] , Thank you for taking time to come for your Medicare Wellness Visit. I appreciate your ongoing commitment to your health goals. Please review the following plan we discussed and let me know if I can assist you in the future.   Screening recommendations/referrals: Colonoscopy: Done 12/13/2014 - Repeat not required Mammogram: Done 03/21/2021 - Repeat annually  Bone Density: Done 03/29/2019 - Repeat every 2 years *due now Recommended yearly ophthalmology/optometry visit for glaucoma screening and checkup Recommended yearly dental visit for hygiene and checkup  Vaccinations: Influenza vaccine: Done 06/01/2020 - Repeat annually Pneumococcal vaccine: Done 09/07/2013 & 06/10/2016 Tdap vaccine: Done 08/03/2017 - Repeat in 10 years Shingles vaccine: Done 10/17/2020 & 02/27/2021   Covid-19: Done 09/14/2019, 10/12/2019, & 06/12/2020 - hesitant to get second booster; discuss with Dr Lajuana Ripple at next visit  Advanced directives: Please bring a copy of your health care power of attorney and living will to the office to be added to your chart at your convenience.   Conditions/risks identified: Aim for 30 minutes of exercise or brisk walking each day, drink 6-8 glasses of water and eat lots of fruits and vegetables.   Next appointment: Follow up in one year for your annual wellness visit    Preventive Care 65 Years and Older, Female Preventive care refers to lifestyle choices and visits with your health care provider that can promote health and wellness. What does preventive care include? A yearly physical exam. This is also called an annual well check. Dental exams once or twice a year. Routine eye exams. Ask your health care provider how often you should have your eyes checked. Personal lifestyle choices, including: Daily care of your teeth and gums. Regular physical activity. Eating a healthy diet. Avoiding tobacco and drug use. Limiting alcohol use. Practicing safe sex. Taking low-dose  aspirin every day. Taking vitamin and mineral supplements as recommended by your health care provider. What happens during an annual well check? The services and screenings done by your health care provider during your annual well check will depend on your age, overall health, lifestyle risk factors, and family history of disease. Counseling  Your health care provider may ask you questions about your: Alcohol use. Tobacco use. Drug use. Emotional well-being. Home and relationship well-being. Sexual activity. Eating habits. History of falls. Memory and ability to understand (cognition). Work and work Statistician. Reproductive health. Screening  You may have the following tests or measurements: Height, weight, and BMI. Blood pressure. Lipid and cholesterol levels. These may be checked every 5 years, or more frequently if you are over 71 years old. Skin check. Lung cancer screening. You may have this screening every year starting at age 11 if you have a 30-pack-year history of smoking and currently smoke or have quit within the past 15 years. Fecal occult blood test (FOBT) of the stool. You may have this test every year starting at age 9. Flexible sigmoidoscopy or colonoscopy. You may have a sigmoidoscopy every 5 years or a colonoscopy every 10 years starting at age 63. Hepatitis C blood test. Hepatitis B blood test. Sexually transmitted disease (STD) testing. Diabetes screening. This is done by checking your blood sugar (glucose) after you have not eaten for a while (fasting). You may have this done every 1-3 years. Bone density scan. This is done to screen for osteoporosis. You may have this done starting at age 1. Mammogram. This may be done every 1-2 years. Talk to your health care provider about how often you should have regular  mammograms. Talk with your health care provider about your test results, treatment options, and if necessary, the need for more tests. Vaccines  Your  health care provider may recommend certain vaccines, such as: Influenza vaccine. This is recommended every year. Tetanus, diphtheria, and acellular pertussis (Tdap, Td) vaccine. You may need a Td booster every 10 years. Zoster vaccine. You may need this after age 43. Pneumococcal 13-valent conjugate (PCV13) vaccine. One dose is recommended after age 47. Pneumococcal polysaccharide (PPSV23) vaccine. One dose is recommended after age 36. Talk to your health care provider about which screenings and vaccines you need and how often you need them. This information is not intended to replace advice given to you by your health care provider. Make sure you discuss any questions you have with your health care provider. Document Released: 08/31/2015 Document Revised: 04/23/2016 Document Reviewed: 06/05/2015 Elsevier Interactive Patient Education  2017 [employer] Prevention in the Home Falls can cause injuries. They can happen to people of all ages. There are many things you can do to make your home safe and to help prevent falls. What can I do on the outside of my home? Regularly fix the edges of walkways and driveways and fix any cracks. Remove anything that might make you trip as you walk through a door, such as a raised step or threshold. Trim any bushes or trees on the path to your home. Use bright outdoor lighting. Clear any walking paths of anything that might make someone trip, such as rocks or tools. Regularly check to see if handrails are loose or broken. Make sure that both sides of any steps have handrails. Any raised decks and porches should have guardrails on the edges. Have any leaves, snow, or ice cleared regularly. Use sand or salt on walking paths during winter. Clean up any spills in your garage right away. This includes oil or grease spills. What can I do in the bathroom? Use night lights. Install grab bars by the toilet and in the tub and shower. Do not use towel bars as  grab bars. Use non-skid mats or decals in the tub or shower. If you need to sit down in the shower, use a plastic, non-slip stool. Keep the floor dry. Clean up any water that spills on the floor as soon as it happens. Remove soap buildup in the tub or shower regularly. Attach bath mats securely with double-sided non-slip rug tape. Do not have throw rugs and other things on the floor that can make you trip. What can I do in the bedroom? Use night lights. Make sure that you have a light by your bed that is easy to reach. Do not use any sheets or blankets that are too big for your bed. They should not hang down onto the floor. Have a firm chair that has side arms. You can use this for support while you get dressed. Do not have throw rugs and other things on the floor that can make you trip. What can I do in the kitchen? Clean up any spills right away. Avoid walking on wet floors. Keep items that you use a lot in easy-to-reach places. If you need to reach something above you, use a strong step stool that has a grab bar. Keep electrical cords out of the way. Do not use floor polish or wax that makes floors slippery. If you must use wax, use non-skid floor wax. Do not have throw rugs and other things on the floor that can  make you trip. What can I do with my stairs? Do not leave any items on the stairs. Make sure that there are handrails on both sides of the stairs and use them. Fix handrails that are broken or loose. Make sure that handrails are as long as the stairways. Check any carpeting to make sure that it is firmly attached to the stairs. Fix any carpet that is loose or worn. Avoid having throw rugs at the top or bottom of the stairs. If you do have throw rugs, attach them to the floor with carpet tape. Make sure that you have a light switch at the top of the stairs and the bottom of the stairs. If you do not have them, ask someone to add them for you. What else can I do to help prevent  falls? Wear shoes that: Do not have high heels. Have rubber bottoms. Are comfortable and fit you well. Are closed at the toe. Do not wear sandals. If you use a stepladder: Make sure that it is fully opened. Do not climb a closed stepladder. Make sure that both sides of the stepladder are locked into place. Ask someone to hold it for you, if possible. Clearly mark and make sure that you can see: Any grab bars or handrails. First and last steps. Where the edge of each step is. Use tools that help you move around (mobility aids) if they are needed. These include: Canes. Walkers. Scooters. Crutches. Turn on the lights when you go into a dark area. Replace any light bulbs as soon as they burn out. Set up your furniture so you have a clear path. Avoid moving your furniture around. If any of your floors are uneven, fix them. If there are any pets around you, be aware of where they are. Review your medicines with your doctor. Some medicines can make you feel dizzy. This can increase your chance of falling. Ask your doctor what other things that you can do to help prevent falls. This information is not intended to replace advice given to you by your health care provider. Make sure you discuss any questions you have with your health care provider. Document Released: 05/31/2009 Document Revised: 01/10/2016 Document Reviewed: 09/08/2014 Elsevier Interactive Patient Education  2017 [employer].

## 2021-04-26 NOTE — Progress Notes (Signed)
Subjective:   [name] is a 87 y.o. female who presents for Medicare Annual (Subsequent) preventive examination.  Virtual Visit via Telephone Note  I connected with  [name] on 04/26/21 at 10:30 AM EDT by telephone and verified that I am speaking with the correct person using two identifiers.  Location: Patient: Home Provider: [facility] Persons participating in the virtual visit: patient/Nurse Health Advisor   I discussed the limitations, risks, security and privacy concerns of performing an evaluation and management service by telephone and the availability of in person appointments. The patient expressed understanding and agreed to proceed.  Interactive audio and video telecommunications were attempted between this nurse and patient, however failed, due to patient having technical difficulties OR patient did not have access to video capability.  We continued and completed visit with audio only.  Some vital signs may be absent or patient reported.   Danasha Melman E Larnell Granlund, LPN   Review of Systems     Cardiac Risk Factors include: advanced age (>33men, >4 women);hypertension;dyslipidemia;sedentary lifestyle     Objective:    Today's Vitals   04/26/21 1029 04/26/21 1030  Weight: 108 lb (49 kg)   Height: 5\' 5"  (1.651 m)   PainSc:  0-No pain   Body mass index is 17.97 kg/m.  Advanced Directives 04/26/2021 04/15/2021 04/09/2021 04/08/2021 01/16/2021 06/03/2020 04/04/2020  Does Patient Have a Medical Advance Directive? Yes Yes Yes Yes Yes Yes Yes  Type of Paramedic of [place];Living will Living will;Healthcare Power of [place];Living will [place];Living will [place];Living will [place];Living will [place];Living will  Does patient want to make changes to medical advance directive? - No - Patient declined - Yes (MAU/Ambulatory/Procedural Areas -  Information given) - - No - Patient declined  Copy of [place] in Chart? No - copy requested No - copy requested No - copy requested No - copy requested - - No - copy requested  Would patient like information on creating a medical advance directive? - - - - - - -    Current Medications (verified) Outpatient Encounter Medications as of 04/26/2021  Medication Sig   acetaminophen (TYLENOL) 325 MG tablet Take 325 mg by mouth daily as needed for moderate pain or headache.   atenolol-chlorthalidone (TENORETIC) 50-25 MG tablet TAKE 1 TABLET BY MOUTH EVERY DAY   atorvastatin (LIPITOR) 20 MG tablet TAKE 1/2 TABLET BY MOUTH EVERY DAY   calcium-vitamin D (OSCAL WITH D) 500-200 MG-UNIT per tablet Take 2 tablets by mouth at bedtime.   cholecalciferol (VITAMIN D) 1000 units tablet Take 1,000 Units by mouth daily.   diclofenac Sodium (VOLTAREN) 1 % GEL Apply 2 g topically 4 (four) times daily.   ferrous sulfate 325 (65 FE) MG tablet Take 325 mg by mouth 2 (two) times daily with a meal.   flecainide (TAMBOCOR) 50 MG tablet TAKE 1 TABLET (50 MG TOTAL) BY MOUTH 2 (TWO) TIMES DAILY. NEED OV.   fluconazole (DIFLUCAN) 50 MG tablet Take 4 tablets (200 mg total) by mouth daily for 21 days. Take 8 tablets on the first day, then take 4 tablets daily   fluticasone (FLONASE) 50 MCG/ACT nasal spray Place 1 spray into both nostrils 2 (two) times daily.   hydrALAZINE (APRESOLINE) 100 MG tablet Take 100 mg by mouth 2 (two) times daily.   hydroxypropyl methylcellulose / hypromellose (ISOPTO TEARS / GONIOVISC) 2.5 % ophthalmic solution Place 1  drop into both eyes daily.   letrozole (FEMARA) 2.5 MG tablet TAKE 1 TABLET BY MOUTH EVERY DAY   levothyroxine (SYNTHROID) 75 MCG tablet Take 1 tablet (75 mcg total) by mouth daily.   lisinopril (ZESTRIL) 20 MG tablet Take 20 mg by mouth in the morning and at bedtime. Per Dr Posey Pronto 01/2021   omeprazole (PRILOSEC) 10 MG capsule Take 1 capsule (10 mg total) by mouth daily.    ondansetron (ZOFRAN ODT) 4 MG disintegrating tablet Take 1 tablet (4 mg total) by mouth every 8 (eight) hours as needed.   Polyethylene Glycol 3350 (MIRALAX PO) Take by mouth. Takes one capful daily   No facility-administered encounter medications on file as of 04/26/2021.    Allergies (verified) Amlodipine, Isosorbide nitrate, Sulfonamide derivatives, and Terazosin   History: Past Medical History:  Diagnosis Date   Anemia    Arthritis    Asthma    Atrial fibrillation ([place])    Basal cell carcinoma 02/06/2009   Left ear targus- ([facility])   Basal cell carcinoma 09/28/2008   Right back-([id number])   CKD (chronic kidney disease), stage III (HCC)    Heart murmur    Hyperlipidemia    Hypertension    Hypothyroidism    Nodule of right lung    Right upper lobe   PONV (postoperative nausea and vomiting)    Renal insufficiency    Chronic   Renal vascular disease    Right renal artery stenosis ([place]) 05/09/2015   Squamous cell carcinoma of skin 09/07/2019   KA-Left shin-txpbx   Vertigo    Past Surgical History:  Procedure Laterality Date   BIOPSY  04/09/2021   Procedure: BIOPSY;  Surgeon: Harvel Quale, MD;  [facility];  Service: Gastroenterology;;   BREAST LUMPECTOMY WITH RADIOACTIVE SEED LOCALIZATION Right 04/08/2018   Procedure: BREAST LUMPECTOMY WITH RADIOACTIVE SEED LOCALIZATION;  Surgeon: Rolm Bookbinder, MD;  Location: [place];  Service: General;  Laterality: Right;   COLONOSCOPY  2016   diverticulosis, hemorrhoids   ESOPHAGOGASTRODUODENOSCOPY  2016   normal   ESOPHAGOGASTRODUODENOSCOPY (EGD) WITH PROPOFOL N/A 04/09/2021   Procedure: ESOPHAGOGASTRODUODENOSCOPY (EGD) WITH PROPOFOL;  Surgeon: Harvel Quale, MD;  [facility];  Service: Gastroenterology;  Laterality: N/A;  12:45   IR GENERIC HISTORICAL  08/29/2016   IR US GUIDE VASC ACCESS RIGHT 08/29/2016 [facility]   IR GENERIC HISTORICAL  08/29/2016   IR RENAL BILAT S&I MOD SED  08/29/2016 [facility]   KNEE ARTHROSCOPY Right    2019   THYROIDECTOMY, PARTIAL Right 1981   middle lobe removed 1st, then right lobe removed 7-8 years later (approx. 74)   Family History  Problem Relation Age of Onset   Thyroid disease Mother    Stroke Father    Hypertension Brother    Lung cancer Maternal Uncle    Cancer Maternal Grandmother        Liver   Lung cancer Maternal Uncle    Hypertension Daughter    Hypercholesterolemia Daughter    Hypercholesterolemia Daughter    Social History   Socioeconomic History   Marital status: Widowed    Spouse name: Not on file   Number of children: 2   Years of education: 36   Highest education level: Not on file  Occupational History   Occupation: Retired  Tobacco Use   Smoking status: Never   Smokeless tobacco: Never  Vaping Use   Vaping Use: Never used  Substance and Sexual Activity   Alcohol use: No  Drug use: No   Sexual activity: Never  Other Topics Concern   Not on file  Social History Narrative   Lives alone, but her daughter stays there at night   Ambidextrous (writes with left hand).   No caffeine use.   Social Determinants of Health   Financial Resource Strain: Low Risk    Difficulty of Paying Living Expenses: Not hard at all  Food Insecurity: No Food Insecurity   Worried About Charity fundraiser in the Last Year: Never true   [place] in the Last Year: Never true  Transportation Needs: No Transportation Needs   Lack of Transportation (Medical): No   Lack of Transportation (Non-Medical): No  Physical Activity: Inactive   Days of Exercise per Week: 0 days   Minutes of Exercise per Session: 0 min  Stress: No Stress Concern Present   Feeling of Stress : Not at all  Social Connections: Moderately Isolated   Frequency of Communication with Friends and Family: More than three times a week   Frequency of Social Gatherings with Friends and Family: More than three times a week   Attends Religious  Services: More than 4 times per year   Active Member of [employer] or Organizations: No   Attends Archivist Meetings: Never   Marital Status: Widowed    Tobacco Counseling Counseling given: Not Answered   Clinical Intake:  Pre-visit preparation completed: Yes  Pain : No/denies pain Pain Score: 0-No pain     BMI - recorded: 17.97 Nutritional Status: BMI <19  Underweight Nutritional Risks: None Diabetes: No  How often do you need to have someone help you when you read instructions, pamphlets, or other written materials from your doctor or pharmacy?: 1 - Never  Diabetic? No  Interpreter Needed?: No  Information entered by :: Daequan Kozma, LPN   Activities of Daily Living In your present state of health, do you have any difficulty performing the following activities: 04/26/2021 04/08/2021  Hearing? Tempie Donning  Comment wears hearing aids -  Vision? N N  Difficulty concentrating or making decisions? N N  Walking or climbing stairs? Y Y  Comment - SOB  Dressing or bathing? N N  Doing errands, shopping? N N  Preparing Food and eating ? N -  Using the Toilet? N -  In the past six months, have you accidently leaked urine? N -  Do you have problems with loss of bowel control? N -  Managing your Medications? N -  Managing your Finances? N -  Housekeeping or managing your Housekeeping? N -  Some recent data might be hidden    Patient Care Team: [name], DO as PCP - General (Family Medicine) Minus Breeding, MD as PCP - Cardiology (Cardiology) Rolm Bookbinder, MD as Consulting Physician (General Surgery) Eppie Gibson, MD as Attending Physician (Radiation Oncology) Truitt Merle, MD as Consulting Physician (Hematology) Lavonna Monarch, MD as Consulting Physician (Dermatology) Warren Danes, PA-C as Physician Assistant (Dermatology)  Indicate any recent Medical Services you may have received from other than [facility] providers in the past year (date may be  approximate).     Assessment:   This is a routine wellness examination for [name].  Hearing/Vision screen Hearing Screening - Comments:: Has hearing aids from [facility] in [place]- doesn't always wear them. Vision Screening - Comments:: Wears glasses to read only - up to date with annual eye exams with Dr Katy Fitch  Dietary issues and exercise activities discussed: Current Exercise  Habits: The patient does not participate in regular exercise at present, Exercise limited by: cardiac condition(s)   Goals Addressed             This Visit's Progress    Exercise 3x per week (30 min per time)       And careful to prevent falls       Depression Screen PHQ 2/9 Scores 04/26/2021 03/08/2021 02/27/2021 02/22/2021 12/27/2020 10/17/2020 06/19/2020  PHQ - 2 Score 0 0 0 0 0 0 0  PHQ- 9 Score - - - - - 0 -    Fall Risk Fall Risk  04/26/2021 03/08/2021 02/27/2021 02/22/2021 12/27/2020  Falls in the past year? 0 0 0 0 0  Number falls in past yr: 0 - - - -  Injury with Fall? 0 - - - -  Comment - - - - -  Risk for fall due to : No Fall Risks - - - -  Follow up Falls prevention discussed - - - -    FALL RISK PREVENTION PERTAINING TO THE HOME:  Any stairs in or around the home? No  If so, are there any without handrails? No  Home free of loose throw rugs in walkways, pet beds, electrical cords, etc? Yes  Adequate lighting in your home to reduce risk of falls? Yes   ASSISTIVE DEVICES UTILIZED TO PREVENT FALLS:  Life alert? No  Use of a cane, walker or w/c? No  Grab bars in the bathroom? Yes  Shower chair or bench in shower? No  Elevated toilet seat or a handicapped toilet? Yes   TIMED UP AND GO:  Was the test performed? No . Telephonic visit  Cognitive Function:     6CIT Screen 04/26/2021  What Year? 0 points  What month? 0 points  What time? 0 points  Count back from 20 0 points  Months in reverse 0 points  Repeat phrase 2 points  Total Score 2    Immunizations Immunization History   Administered Date(s) Administered   Fluad Quad(high Dose 65+) 06/13/2019, 06/01/2020   Influenza, High Dose Seasonal PF 06/01/2017, 06/04/2018   Influenza,inj,Quad PF,6+ Mos 05/31/2013, 05/30/2015, 06/10/2016   Moderna Sars-Covid-2 Vaccination 09/14/2019, 10/12/2019, 06/12/2020   Pneumococcal Conjugate-13 09/07/2013   Pneumococcal Polysaccharide-23 06/10/2016   Td 10/10/2010, 08/03/2017   Tdap 10/10/2010   Zoster Recombinat (Shingrix) 10/17/2020, 02/27/2021   Zoster, Live 05/20/2010    TDAP status: Up to date  Flu Vaccine status: Up to date  Pneumococcal vaccine status: Up to date  Covid-19 vaccine status: Completed vaccines  Qualifies for Shingles Vaccine? Yes   Zostavax completed Yes   Shingrix Completed?: Yes  Screening Tests Health Maintenance  Topic Date Due   COVID-19 Vaccine (4 - Booster for Moderna series) 09/04/2020   DEXA SCAN  03/28/2021   INFLUENZA VACCINE  05/17/2021 (Originally 03/18/2021)   MAMMOGRAM  03/20/2023   TETANUS/TDAP  08/04/2027   PNA vac Low Risk Adult  Completed   Zoster Vaccines- Shingrix  Completed   HPV VACCINES  Aged Out    Health Maintenance  Health Maintenance Due  Topic Date Due   COVID-19 Vaccine (4 - Booster for Moderna series) 09/04/2020   DEXA SCAN  03/28/2021    Colorectal cancer screening: No longer required.   Mammogram status: Completed 03/21/2021. Repeat every year  Bone Density status: Ordered 04/21/2021. Pt provided with contact info and advised to call to schedule appt.  Lung Cancer Screening: (Low Dose CT Chest recommended if Age 71-80 years,  30 pack-year currently smoking OR have quit w/in 15years.) does not qualify.  Additional Screening:  Hepatitis C Screening: does not qualify  Vision Screening: Recommended annual ophthalmology exams for early detection of glaucoma and other disorders of the eye. Is the patient up to date with their annual eye exam?  Yes  Who is the provider or what is the name of the office in  which the patient attends annual eye exams? [facility] If pt is not established with a provider, would they like to be referred to a provider to establish care? No .   Dental Screening: Recommended annual dental exams for proper oral hygiene  Community Resource Referral / Chronic Care Management: CRR required this visit?  No   CCM required this visit?  No      Plan:     I have personally reviewed and noted the following in the patient's chart:   Medical and social history Use of alcohol, tobacco or illicit drugs  Current medications and supplements including opioid prescriptions.  Functional ability and status Nutritional status Physical activity Advanced directives List of other physicians Hospitalizations, surgeries, and ER visits in previous 12 months Vitals Screenings to include cognitive, depression, and falls Referrals and appointments  In addition, I have reviewed and discussed with patient certain preventive protocols, quality metrics, and best practice recommendations. A written personalized care plan for preventive services as well as general preventive health recommendations were provided to patient.     Sandrea Hammond, LPN   01/21/599   Nurse Notes: None

## 2021-04-29 ENCOUNTER — Encounter (HOSPITAL_COMMUNITY): Payer: PPO | Admitting: Dietician

## 2021-04-29 ENCOUNTER — Other Ambulatory Visit: Payer: PPO

## 2021-04-29 ENCOUNTER — Telehealth (HOSPITAL_COMMUNITY): Payer: Self-pay | Admitting: Dietician

## 2021-04-29 NOTE — Telephone Encounter (Signed)
Nutrition Assessment   Reason for Assessment: MST (+ weight loss, poor appetite)   ASSESSMENT: 87 year old female with right breast cancer. She is receiving letrozole  Past medical history includes atrial fibrillation, ferrous sulfate, Oscal with D, CHF, CKD3, HTN, HLD, hypothyroidism, pancytopenia  Spoke with patient via telephone. Introduced self and services available at [facility]. Patient appreciative of call and agreeable to telephone visit. Patient reports her appetite is improving, denies nutrition impact symptoms. Patient reports recent 8/23 EGD revealed candida esophagitis which caused her to feel nauseas when eating. She continues to take Fluconazole for this, her symptoms have resolved. Patient reports she has been losing weight over the last 8 years due to thyroid problems, reports 146 lb most of her life but more recently she has weighed ~125 lb. Patient has started drinking Ensure, has one daily unless she forget and eats 3 meals/day. Patient ate sausage and eggs for breakfast, tuna salad for lunch. She ate a tomato sandwich for dinner last night because she "ate too much" at the church homecoming yesterday (creamed pots, gravy, baked chicken, mac/cheese, beans, roll). Patient really likes peanut butter, says she will eat a couple of spoonfuls a few days a week.   Nutrition Focused Physical Exam: unable to complete   Medications: ferrous sulfate, vit D, Oscal, prilosec, zofran   Labs: 8/29 results reviewed   Anthropometrics:  Height: 5'5" Weight: 108 lb (9/9) UBW: 126 lb (per pt) BMI: 17.97    NUTRITION DIAGNOSIS: Inadequate oral intake related to reported decreased appetite and nausea as evidenced by candida esophagitis and 18 lbs (14%) under reported usual weight and 5 lb (4.4%) weight loss in 3 weeks, which is significant for time frame.   INTERVENTION:  Discussed importance of adequate calorie and protein energy intake to maintain strength, weights,  nutrition Encouraged eating smaller meals and snacks, provided high calorie/protein snack ideas - will mail handout with recipes Patient will have bedtime snack for added calories Continue drinking 1-2 Ensure Plus/equivalent daily - will mail coupons Contact information provided     MONITORING, EVALUATION, GOAL: Patient will tolerate increased calories and protein to promote weight stability/gain    Next Visit: via telephone ~6 weeks

## 2021-05-07 ENCOUNTER — Encounter: Payer: Self-pay | Admitting: Family Medicine

## 2021-05-07 ENCOUNTER — Other Ambulatory Visit: Payer: Self-pay

## 2021-05-07 ENCOUNTER — Ambulatory Visit (INDEPENDENT_AMBULATORY_CARE_PROVIDER_SITE_OTHER): Payer: PPO | Admitting: Family Medicine

## 2021-05-07 VITALS — BP 147/66 | HR 54 | Temp 98.0°F | Ht 65.0 in | Wt 101.8 lb

## 2021-05-07 DIAGNOSIS — S80922A Unspecified superficial injury of left lower leg, initial encounter: Secondary | ICD-10-CM | POA: Diagnosis not present

## 2021-05-07 DIAGNOSIS — T148XXA Other injury of unspecified body region, initial encounter: Secondary | ICD-10-CM

## 2021-05-07 MED ORDER — CEPHALEXIN 250 MG PO CAPS: 250.0000 mg | ORAL_CAPSULE | Freq: Three times a day (TID) | ORAL | 0 refills | Status: DC

## 2021-05-07 NOTE — Progress Notes (Signed)
Subjective: CC: skin lesion PCP: Janet Norlander, DO [name] [name] is a 87 y.o. female presenting to clinic today for:  1. Skin lesion Reports onset of skin lesion on the left calf about 2 weeks ago.  Several days after onset she started developing mild surrounding erythema.  She reports no active drainage.  She is always had easy bruising and bleeding.  Does not recall specific injury causing that lesion but wanted to get it checked out just in case.   ROS: Per HPI  Allergies  Allergen Reactions   Amlodipine Other (See Comments)    edema   Isosorbide Nitrate Nausea Only   Sulfonamide Derivatives Nausea Only and Rash   Terazosin Hives and Nausea Only   Past Medical History:  Diagnosis Date   Anemia    Arthritis    Asthma    Atrial fibrillation ([place])    Basal cell carcinoma 02/06/2009   Left ear targus- ([facility])   Basal cell carcinoma 09/28/2008   Right back-(CX35FU)   CKD (chronic kidney disease), stage III (HCC)    Heart murmur    Hyperlipidemia    Hypertension    Hypothyroidism    Nodule of right lung    Right upper lobe   PONV (postoperative nausea and vomiting)    Renal insufficiency    Chronic   Renal vascular disease    Right renal artery stenosis (HCC) 05/09/2015   Squamous cell carcinoma of skin 09/07/2019   KA-Left shin-txpbx   Vertigo     Current Outpatient Medications:    acetaminophen (TYLENOL) 325 MG tablet, Take 325 mg by mouth daily as needed for moderate pain or headache., Disp: , Rfl:    atenolol-chlorthalidone (TENORETIC) 50-25 MG tablet, TAKE 1 TABLET BY MOUTH EVERY DAY, Disp: 90 tablet, Rfl: 0   atorvastatin (LIPITOR) 20 MG tablet, TAKE 1/2 TABLET BY MOUTH EVERY DAY, Disp: 45 tablet, Rfl: 0   calcium-vitamin D (OSCAL WITH D) 500-200 MG-UNIT per tablet, Take 2 tablets by mouth at bedtime., Disp: , Rfl:    cholecalciferol (VITAMIN D) 1000 units tablet, Take 1,000 Units by mouth daily., Disp: , Rfl:    diclofenac Sodium (VOLTAREN) 1 %  GEL, Apply 2 g topically 4 (four) times daily., Disp: 100 g, Rfl: 0   ferrous sulfate 325 (65 FE) MG tablet, Take 325 mg by mouth 2 (two) times daily with a meal., Disp: , Rfl:    flecainide (TAMBOCOR) 50 MG tablet, TAKE 1 TABLET (50 MG TOTAL) BY MOUTH 2 (TWO) TIMES DAILY. NEED OV., Disp: 180 tablet, Rfl: 0   fluticasone (FLONASE) 50 MCG/ACT nasal spray, Place 1 spray into both nostrils 2 (two) times daily., Disp: 16 g, Rfl: 5   hydrALAZINE (APRESOLINE) 100 MG tablet, Take 100 mg by mouth 2 (two) times daily., Disp: , Rfl:    hydroxypropyl methylcellulose / hypromellose (ISOPTO TEARS / GONIOVISC) 2.5 % ophthalmic solution, Place 1 drop into both eyes daily., Disp: , Rfl:    letrozole (FEMARA) 2.5 MG tablet, TAKE 1 TABLET BY MOUTH EVERY DAY, Disp: 90 tablet, Rfl: 4   levothyroxine (SYNTHROID) 75 MCG tablet, Take 1 tablet (75 mcg total) by mouth daily., Disp: 90 tablet, Rfl: 3   lisinopril (ZESTRIL) 20 MG tablet, Take 20 mg by mouth in the morning and at bedtime. Per Dr Posey Pronto 01/2021, Disp: , Rfl:    omeprazole (PRILOSEC) 10 MG capsule, Take 1 capsule (10 mg total) by mouth daily., Disp: 90 capsule, Rfl: 0   ondansetron (ZOFRAN  ODT) 4 MG disintegrating tablet, Take 1 tablet (4 mg total) by mouth every 8 (eight) hours as needed., Disp: 30 tablet, Rfl: 2   Polyethylene Glycol 3350 (MIRALAX PO), Take by mouth. Takes one capful daily, Disp: , Rfl:  Social History   Socioeconomic History   Marital status: Widowed    Spouse name: Not on file   Number of children: 2   Years of education: 77   Highest education level: Not on file  Occupational History   Occupation: Retired  Tobacco Use   Smoking status: Never   Smokeless tobacco: Never  Vaping Use   Vaping Use: Never used  Substance and Sexual Activity   Alcohol use: No   Drug use: No   Sexual activity: Never  Other Topics Concern   Not on file  Social History Narrative   Lives alone, but her daughter stays there at night   Ambidextrous  (writes with left hand).   No caffeine use.   Social Determinants of Health   Financial Resource Strain: Low Risk    Difficulty of Paying Living Expenses: Not hard at all  Food Insecurity: No Food Insecurity   Worried About Charity fundraiser in the Last Year: Never true   [place] in the Last Year: Never true  Transportation Needs: No Transportation Needs   Lack of Transportation (Medical): No   Lack of Transportation (Non-Medical): No  Physical Activity: Inactive   Days of Exercise per Week: 0 days   Minutes of Exercise per Session: 0 min  Stress: No Stress Concern Present   Feeling of Stress : Not at all  Social Connections: Moderately Isolated   Frequency of Communication with Friends and Family: More than three times a week   Frequency of Social Gatherings with Friends and Family: More than three times a week   Attends Religious Services: More than 4 times per year   Active Member of [employer] or Organizations: No   Attends Archivist Meetings: Never   Marital Status: Widowed  Human resources officer Violence: Not At Risk   Fear of Current or Ex-Partner: No   Emotionally Abused: No   Physically Abused: No   Sexually Abused: No   Family History  Problem Relation Age of Onset   Thyroid disease Mother    Stroke Father    Hypertension Brother    Lung cancer Maternal Uncle    Cancer Maternal Grandmother        Liver   Lung cancer Maternal Uncle    Hypertension Daughter    Hypercholesterolemia Daughter    Hypercholesterolemia Daughter     Objective: Office vital signs reviewed. BP (!) 157/67   Pulse (!) 54   Temp 98 F (36.7 C)   Ht 5\' 5"  (1.651 m)   Wt 101 lb 12.8 oz (46.2 kg)   SpO2 95%   BMI 16.94 kg/m   Physical Examination:  General: Awake, alert, well appearing elderly female, No acute distress Skin: Multiple areas of hemosiderin deposition noted along bilateral anterior legs.  She has a dime sized area of hematoma noted along the medial left  calf that has mild surrounding erythema but no evidence of skin breakdown or ulceration.  Minimal tenderness to palpation.  Assessment/ Plan: 87 y.o. female   Hematoma of skin - Plan: cephALEXin (KEFLEX) 250 MG capsule  Some mild surrounding erythema.  I am going to empirically treat her with a little bit of antibiotics have been renally dosed.  Home care  instructions reviewed and reasons for emergent evaluation discussed.  Handout provided  No orders of the defined types were placed in this encounter.  No orders of the defined types were placed in this encounter.    Janet Norlander, DO [place] [phone]

## 2021-05-07 NOTE — Patient Instructions (Signed)
Hematoma A hematoma is a collection of blood. A hematoma can happen: Under the skin. In an organ. In a body space. In a joint space. In other tissues. The blood can thicken (clot) to form a lump that you can see and feel. The lump is often hard and may become sore and tender. The lump can be very small or very big. Most hematomas get better in a few days to weeks. However, some hematomas may be serious andneed medical care. What are the causes? This condition is caused by: An injury. Blood that leaks under the skin. Problems from surgeries. Medical conditions that cause bleeding or bruising. What increases the risk? You are more likely to develop this condition if: You are an older adult. You use medicines that thin your blood. What are the signs or symptoms? Symptoms depend on where the hematoma is in your body. If the hematoma is under the skin, there is: A firm lump on the body. Pain and tenderness in the area. Bruising. The skin above the lump may be blue, dark blue, purple-red, or yellowish. If the hematoma is deep in the tissues or body spaces, there may be: Blood in the stomach. This may cause pain in the belly (abdomen), weakness, passing out (fainting), and shortness of breath. Blood in the head. This may cause a headache, weakness, trouble speaking or understanding speech, or passing out. How is this diagnosed? This condition is diagnosed based on: Your medical history. A physical exam. Imaging tests, such as ultrasound or CT scan. Blood tests. How is this treated? Treatment depends on the cause, size, and location of the hematoma. Treatment may include: Doing nothing. Many hematomas go away on their own without treatment. Surgery or close monitoring. This may be needed for large hematomas or hematomas that affect the body's organs. Medicines. These may be given if a medical condition caused the hematoma. Follow these instructions at home: Managing pain, stiffness,  and swelling  If told, put ice on the area. Put ice in a plastic bag. Place a towel between your skin and the bag. Leave the ice on for 20 minutes, 2-3 times a day for the first two days. If told, put heat on the affected area after putting ice on the area for two days. Use the heat source that your doctor tells you to use. This could be a moist heat pack or a heating pad. To do this: Place a towel between your skin and the heat source. Leave the heat on for 20-30 minutes. Remove the heat if your skin turns bright red. This is very important if you are unable to feel pain, heat, or cold. You may have a greater risk of getting burned. Raise (elevate) the affected area above the level of your heart while you are sitting or lying down. Wrap the affected area with an elastic bandage, if told by your doctor. Do not wrap the bandage too tightly. If your hematoma is on a leg or foot and is painful, your doctor may give you crutches. Use them as told by your doctor.  General instructions Take over-the-counter and prescription medicines only as told by your doctor. Keep all follow-up visits as told by your doctor. This is important. Contact a doctor if: You have a fever. The swelling or bruising gets worse. You start to get more hematomas. Get help right away if: Your pain gets worse. Your pain is not getting better with medicine. Your skin over the hematoma breaks or starts to bleed.   Your hematoma is in your chest or belly and you: Pass out. Feel weak. Become short of breath. You have a hematoma on your scalp that is caused by a fall or injury, and you: Have a headache that gets worse. Have trouble speaking or understanding speech. Become less alert or you pass out. Summary A hematoma is a collection of blood in any part of your body. Most hematomas get better on their own in a few days to weeks. Some may need medical care. Follow instructions from your doctor about how to care for your  hematoma. Contact a doctor if the swelling or bruising gets worse, or if you are short of breath. This information is not intended to replace advice given to you by your health care provider. Make sure you discuss any questions you have with your healthcare provider. Document Revised: 01/07/2018 Document Reviewed: 01/07/2018 Elsevier Patient Education  2022 Elsevier Inc.  

## 2021-05-08 ENCOUNTER — Telehealth: Payer: Self-pay | Admitting: Family Medicine

## 2021-05-08 NOTE — Telephone Encounter (Signed)
Pt called stating that she needs Dr Lajuana Ripple to put in an order for her to have her lab work done. Says she was seen at another doctors office recently and her sodium level and some other level was high.  Please advise and call patient.

## 2021-05-09 ENCOUNTER — Other Ambulatory Visit: Payer: PPO

## 2021-05-09 ENCOUNTER — Other Ambulatory Visit: Payer: Self-pay

## 2021-05-09 DIAGNOSIS — D696 Thrombocytopenia, unspecified: Secondary | ICD-10-CM

## 2021-05-09 DIAGNOSIS — I10 Essential (primary) hypertension: Secondary | ICD-10-CM

## 2021-05-10 LAB — CMP14+EGFR
ALT: 9 IU/L (ref 0–32)
AST: 21 IU/L (ref 0–40)
Albumin/Globulin Ratio: 1.4 (ref 1.2–2.2)
Albumin: 4.3 g/dL (ref 3.6–4.6)
Alkaline Phosphatase: 72 IU/L (ref 44–121)
BUN/Creatinine Ratio: 23 (ref 12–28)
BUN: 33 mg/dL — ABNORMAL HIGH (ref 8–27)
Bilirubin Total: 0.4 mg/dL (ref 0.0–1.2)
CO2: 27 mmol/L (ref 20–29)
Calcium: 10.2 mg/dL (ref 8.7–10.3)
Chloride: 90 mmol/L — ABNORMAL LOW (ref 96–106)
Creatinine, Ser: 1.45 mg/dL — ABNORMAL HIGH (ref 0.57–1.00)
Globulin, Total: 3 g/dL (ref 1.5–4.5)
Glucose: 110 mg/dL — ABNORMAL HIGH (ref 65–99)
Potassium: 4.3 mmol/L (ref 3.5–5.2)
Sodium: 133 mmol/L — ABNORMAL LOW (ref 134–144)
Total Protein: 7.3 g/dL (ref 6.0–8.5)
eGFR: 35 mL/min/{1.73_m2} — ABNORMAL LOW (ref >59)

## 2021-05-10 LAB — CBC WITH DIFFERENTIAL/PLATELET
Basophils Absolute: 0 10*3/uL (ref 0.0–0.2)
Basos: 1 %
EOS (ABSOLUTE): 0 10*3/uL (ref 0.0–0.4)
Eos: 1 %
Hematocrit: 33.8 % — ABNORMAL LOW (ref 34.0–46.6)
Hemoglobin: 11.2 g/dL (ref 11.1–15.9)
Immature Grans (Abs): 0 10*3/uL (ref 0.0–0.1)
Immature Granulocytes: 0 %
Lymphocytes Absolute: 1 10*3/uL (ref 0.7–3.1)
Lymphs: 35 %
MCH: 28.2 pg (ref 26.6–33.0)
MCHC: 33.1 g/dL (ref 31.5–35.7)
MCV: 85 fL (ref 79–97)
Monocytes Absolute: 0.2 10*3/uL (ref 0.1–0.9)
Monocytes: 8 %
Neutrophils Absolute: 1.6 10*3/uL (ref 1.4–7.0)
Neutrophils: 55 %
Platelets: 96 10*3/uL — CL (ref 150–450)
RBC: 3.97 x10E6/uL (ref 3.77–5.28)
RDW: 14.4 % (ref 11.7–15.4)
WBC: 2.9 10*3/uL — ABNORMAL LOW (ref 3.4–10.8)

## 2021-05-10 LAB — FERRITIN: Ferritin: 817 ng/mL — ABNORMAL HIGH (ref 15–150)

## 2021-05-10 LAB — IRON AND TIBC
Iron Saturation: 21 % (ref 15–55)
Iron: 50 ug/dL (ref 27–139)
Total Iron Binding Capacity: 240 ug/dL — ABNORMAL LOW (ref 250–450)
UIBC: 190 ug/dL (ref 118–369)

## 2021-05-14 ENCOUNTER — Other Ambulatory Visit: Payer: Self-pay

## 2021-05-14 ENCOUNTER — Encounter: Payer: Self-pay | Admitting: Nurse Practitioner

## 2021-05-14 ENCOUNTER — Ambulatory Visit (INDEPENDENT_AMBULATORY_CARE_PROVIDER_SITE_OTHER): Payer: PPO | Admitting: Nurse Practitioner

## 2021-05-14 VITALS — BP 190/65 | HR 82 | Temp 97.9°F | Ht 65.0 in | Wt 106.8 lb

## 2021-05-14 DIAGNOSIS — S81802A Unspecified open wound, left lower leg, initial encounter: Secondary | ICD-10-CM

## 2021-05-14 DIAGNOSIS — S81811A Laceration without foreign body, right lower leg, initial encounter: Secondary | ICD-10-CM | POA: Insufficient documentation

## 2021-05-14 NOTE — Progress Notes (Signed)
Acute Office Visit  Subjective:    Patient ID: [name], female    DOB: 04-29-1934, 87 y.o.   MRN: [id number]  Chief Complaint  Patient presents with   Open Wound    Left lower leg x 6 days    HPI Patient is in today for Skin tear:  The tear is located on the left leg. Tear has been present 2 days. Pain is rated 3/10. Interventions to date: none. Patient does not use tobacco use. Patient does not have a history of diabetes.  She accidentally scratched already fragile skin against right heal and developed a worse skin tear. Recently completed keflex for prior skin teat , 24 hours befor the current incidence.    Past Medical History:  Diagnosis Date   Anemia    Arthritis    Asthma    Atrial fibrillation ([place])    Basal cell carcinoma 02/06/2009   Left ear targus- ([facility])   Basal cell carcinoma 09/28/2008   Right back-(CX35FU)   CKD (chronic kidney disease), stage III (HCC)    Heart murmur    Hyperlipidemia    Hypertension    Hypothyroidism    Nodule of right lung    Right upper lobe   PONV (postoperative nausea and vomiting)    Renal insufficiency    Chronic   Renal vascular disease    Right renal artery stenosis ([place]) 05/09/2015   Squamous cell carcinoma of skin 09/07/2019   KA-Left shin-txpbx   Vertigo     Past Surgical History:  Procedure Laterality Date   BIOPSY  04/09/2021   Procedure: BIOPSY;  Surgeon: Harvel Quale, MD;  [facility];  Service: Gastroenterology;;   BREAST LUMPECTOMY WITH RADIOACTIVE SEED LOCALIZATION Right 04/08/2018   Procedure: BREAST LUMPECTOMY WITH RADIOACTIVE SEED LOCALIZATION;  Surgeon: Rolm Bookbinder, MD;  Location: [place];  Service: General;  Laterality: Right;   COLONOSCOPY  2016   diverticulosis, hemorrhoids   ESOPHAGOGASTRODUODENOSCOPY  2016   normal   ESOPHAGOGASTRODUODENOSCOPY (EGD) WITH PROPOFOL N/A 04/09/2021   Procedure: ESOPHAGOGASTRODUODENOSCOPY (EGD) WITH PROPOFOL;  Surgeon: Harvel Quale, MD;  [facility];  Service: Gastroenterology;  Laterality: N/A;  12:45   IR GENERIC HISTORICAL  08/29/2016   IR US GUIDE VASC ACCESS RIGHT 08/29/2016 [facility]   IR GENERIC HISTORICAL  08/29/2016   IR RENAL BILAT S&I MOD SED 08/29/2016 [facility]   KNEE ARTHROSCOPY Right    2019   THYROIDECTOMY, PARTIAL Right 1981   middle lobe removed 1st, then right lobe removed 7-8 years later (approx. 61)    Family History  Problem Relation Age of Onset   Thyroid disease Mother    Stroke Father    Hypertension Brother    Lung cancer Maternal Uncle    Cancer Maternal Grandmother        Liver   Lung cancer Maternal Uncle    Hypertension Daughter    Hypercholesterolemia Daughter    Hypercholesterolemia Daughter     Social History   Socioeconomic History   Marital status: Widowed    Spouse name: Not on file   Number of children: 2   Years of education: 78   Highest education level: Not on file  Occupational History   Occupation: Retired  Tobacco Use   Smoking status: Never   Smokeless tobacco: Never  Vaping Use   Vaping Use: Never used  Substance and Sexual Activity   Alcohol use: No   Drug use: No  Sexual activity: Never  Other Topics Concern   Not on file  Social History Narrative   Lives alone, but her daughter stays there at night   Ambidextrous (writes with left hand).   No caffeine use.   Social Determinants of Health   Financial Resource Strain: Low Risk    Difficulty of Paying Living Expenses: Not hard at all  Food Insecurity: No Food Insecurity   Worried About Charity fundraiser in the Last Year: Never true   [place] in the Last Year: Never true  Transportation Needs: No Transportation Needs   Lack of Transportation (Medical): No   Lack of Transportation (Non-Medical): No  Physical Activity: Inactive   Days of Exercise per Week: 0 days   Minutes of Exercise per Session: 0 min  Stress: No Stress Concern Present   Feeling of  Stress : Not at all  Social Connections: Moderately Isolated   Frequency of Communication with Friends and Family: More than three times a week   Frequency of Social Gatherings with Friends and Family: More than three times a week   Attends Religious Services: More than 4 times per year   Active Member of [employer] or Organizations: No   Attends Archivist Meetings: Never   Marital Status: Widowed  Human resources officer Violence: Not At Risk   Fear of Current or Ex-Partner: No   Emotionally Abused: No   Physically Abused: No   Sexually Abused: No    Outpatient Medications Prior to Visit  Medication Sig Dispense Refill   acetaminophen (TYLENOL) 325 MG tablet Take 325 mg by mouth daily as needed for moderate pain or headache.     atenolol-chlorthalidone (TENORETIC) 50-25 MG tablet TAKE 1 TABLET BY MOUTH EVERY DAY 90 tablet 0   atorvastatin (LIPITOR) 20 MG tablet TAKE 1/2 TABLET BY MOUTH EVERY DAY 45 tablet 0   calcium-vitamin D (OSCAL WITH D) 500-200 MG-UNIT per tablet Take 2 tablets by mouth at bedtime.     cholecalciferol (VITAMIN D) 1000 units tablet Take 1,000 Units by mouth daily.     diclofenac Sodium (VOLTAREN) 1 % GEL Apply 2 g topically 4 (four) times daily. 100 g 0   ferrous sulfate 325 (65 FE) MG tablet Take 325 mg by mouth 2 (two) times daily with a meal.     flecainide (TAMBOCOR) 50 MG tablet TAKE 1 TABLET (50 MG TOTAL) BY MOUTH 2 (TWO) TIMES DAILY. NEED OV. 180 tablet 0   fluticasone (FLONASE) 50 MCG/ACT nasal spray Place 1 spray into both nostrils 2 (two) times daily. 16 g 5   hydrALAZINE (APRESOLINE) 100 MG tablet Take 100 mg by mouth 2 (two) times daily.     hydroxypropyl methylcellulose / hypromellose (ISOPTO TEARS / GONIOVISC) 2.5 % ophthalmic solution Place 1 drop into both eyes daily.     letrozole (FEMARA) 2.5 MG tablet TAKE 1 TABLET BY MOUTH EVERY DAY 90 tablet 4   levothyroxine (SYNTHROID) 75 MCG tablet Take 1 tablet (75 mcg total) by mouth daily. 90 tablet 3    lisinopril (ZESTRIL) 20 MG tablet Take 20 mg by mouth in the morning and at bedtime. Per Dr Posey Pronto 01/2021     omeprazole (PRILOSEC) 10 MG capsule Take 1 capsule (10 mg total) by mouth daily. 90 capsule 0   ondansetron (ZOFRAN ODT) 4 MG disintegrating tablet Take 1 tablet (4 mg total) by mouth every 8 (eight) hours as needed. 30 tablet 2   Polyethylene Glycol 3350 (MIRALAX PO) Take  by mouth. Takes one capful daily     cephALEXin (KEFLEX) 250 MG capsule Take 1 capsule (250 mg total) by mouth 3 (three) times daily for 7 days. 21 capsule 0   No facility-administered medications prior to visit.    Allergies  Allergen Reactions   Amlodipine Other (See Comments)    edema   Isosorbide Nitrate Nausea Only   Sulfonamide Derivatives Nausea Only and Rash   Terazosin Hives and Nausea Only    Review of Systems  Constitutional: Negative.   HENT: Negative.    Eyes: Negative.   Respiratory: Negative.    Cardiovascular: Negative.   Gastrointestinal: Negative.   Skin:  Positive for color change and wound.  All other systems reviewed and are negative.     Objective:    Physical Exam Vitals and nursing note reviewed.  Constitutional:      Appearance: Normal appearance.  HENT:     Head: Normocephalic.     Right Ear: Ear canal normal.     Left Ear: Ear canal normal.     Nose: Nose normal.  Cardiovascular:     Rate and Rhythm: Normal rate and regular rhythm.     Pulses: Normal pulses.     Heart sounds: Normal heart sounds.  Pulmonary:     Effort: Pulmonary effort is normal.     Breath sounds: Normal breath sounds.  Abdominal:     General: Bowel sounds are normal.  Skin:    Findings: Bruising present.          Comments: Skin tear 1.5 X 1.5   Neurological:     Mental Status: She is oriented to person, place, and time.    BP (!) 190/65   Pulse 82   Temp 97.9 F (36.6 C) (Temporal)   Ht _0  (1.651 m)   Wt 106 lb 12.8 oz (48.4 kg)   BMI 17.77 kg/m  Wt Readings from Last 3  Encounters:  05/14/21 106 lb 12.8 oz (48.4 kg)  05/07/21 101 lb 12.8 oz (46.2 kg)  04/26/21 108 lb (49 kg)    There are no preventive care reminders to display for this patient.  There are no preventive care reminders to display for this patient.   Lab Results  Component Value Date   TSH 2.320 10/17/2020   Lab Results  Component Value Date   WBC 2.9 (L) 05/09/2021   HGB 11.2 05/09/2021   HCT 33.8 (L) 05/09/2021   MCV 85 05/09/2021   PLT 96 (LL) 05/09/2021   Lab Results  Component Value Date   NA 133 (L) 05/09/2021   K 4.3 05/09/2021   CO2 27 05/09/2021   GLUCOSE 110 (H) 05/09/2021   BUN 33 (H) 05/09/2021   CREATININE 1.45 (H) 05/09/2021   BILITOT 0.4 05/09/2021   ALKPHOS 72 05/09/2021   AST 21 05/09/2021   ALT 9 05/09/2021   PROT 7.3 05/09/2021   ALBUMIN 4.3 05/09/2021   CALCIUM 10.2 05/09/2021   ANIONGAP 7 04/08/2021   EGFR 35 (L) 05/09/2021   GFR 34.35 (L) 04/10/2014   Lab Results  Component Value Date   CHOL 145 02/08/2019   Lab Results  Component Value Date   HDL 55 02/08/2019   Lab Results  Component Value Date   LDLCALC 72 02/08/2019   Lab Results  Component Value Date   TRIG 91 02/08/2019   Lab Results  Component Value Date   CHOLHDL 2.6 02/08/2019   Lab Results  Component Value Date  HGBA1C 5.0 02/22/2015       Assessment & Plan:   Problem List Items Addressed This Visit       Musculoskeletal and Integument   Skin tear of right lower leg without complication - Primary    Education provided for wound care, signs and symptoms at home infection.  Patient just completed 7 days of Keflex 500 mg tablet by mouth.  I will not reorder antibiotics.  Skin is not bleeding, inflamed or showing any signs or symptoms of infection. Advised patient to keep skin protected with Vaseline gauze and nonstick clear.  Follow-up Monday for reevaluation.  I cleaned skin tear, covered it with Vaseline gauze, wrapped it, and education patient how to care for  wound at home.        No orders of the defined types were placed in this encounter.    Ivy Lynn, NP

## 2021-05-14 NOTE — Assessment & Plan Note (Addendum)
Education provided for wound care, signs and symptoms at home infection.  Patient just completed 7 days of Keflex 500 mg tablet by mouth.  I will not reorder antibiotics.  Skin is not bleeding, inflamed or showing any signs or symptoms of infection. Advised patient to keep skin protected with Vaseline gauze and nonstick clear.  Follow-up Monday for reevaluation.  I cleaned skin tear, covered it with Vaseline gauze, wrapped it, and education patient how to care for wound at home.

## 2021-05-14 NOTE — Patient Instructions (Signed)
Skin Tear A skin tear is a wound in which the top layers of skin peel off. This is a common problem for older people. It can also be a problem for people who takecertain medicines for too long. To repair the skin, your doctor may use: Tape. Skin tape (adhesive) strips. A bandage (dressing) may also be placed over the tape or skin tape strips. Follow these instructions at home: Keep your wound clean Clean the wound as told by your doctor. You may be told to keep the wound dry for the first few days. If you are told to clean the wound: Wash the wound with mild soap and water, a wound cleanser, or a salt-water (saline) solution. If you use soap, rinse the wound with water to get all the soap off. Do not rub the wound dry. Pat the wound gently with a clean towel or let it air-dry. Change any bandage as told by your doctor. This includes changing the bandage if it gets wet, gets dirty, or starts to smell bad. To change your bandage: Wash your hands with soap and water for at least 20 seconds before and after you change your bandage. If you cannot use soap and water, use hand sanitizer. Leave tape or skin tape strips alone unless you are told to take them off. You may trim the edges of the tape strips if they curl up. Watch for signs of infection Check your wound every day for signs of infection. Check for: Redness, swelling, or pain. More fluid or blood. Warmth. Pus or a bad smell.  Protect your wound Do not scratch or pick at the wound. Protect the injured area until it has healed. Medicines Take or apply over-the-counter and prescription medicines only as told by your doctor. If you were prescribed an antibiotic medicine, take or apply it as told by your doctor. Do not stop using it even if your condition gets better. General instructions  Keep the bandage dry. Do not take baths, swim, use a hot tub, or do anything that puts your wound underwater. Ask your doctor about taking showers or  sponge baths. Keep all follow-up visits.  Contact a doctor if: You have any of these signs of infection in your wound: Redness, swelling, or pain. More fluid or blood. Warmth. Pus or a bad smell. Get help right away if: You have a red streak that goes away from the skin tear. You have a fever and chills, and your symptoms get worse all of a sudden. Summary A skin tear is a wound in which the top layers of skin peel off. To repair the skin, your doctor may use tape or skin tape strips. Change any bandage as told by your doctor. Take or apply over-the-counter and prescription medicines only as told by your doctor. Contact a doctor if you have signs of infection. This information is not intended to replace advice given to you by your health care provider. Make sure you discuss any questions you have with your healthcare provider. Document Revised: 11/09/2019 Document Reviewed: 11/09/2019 Elsevier Patient Education  2022 [employer] Inc.  

## 2021-05-15 ENCOUNTER — Telehealth: Payer: Self-pay | Admitting: *Deleted

## 2021-05-15 NOTE — Telephone Encounter (Signed)
Patient aware and verbalizes understanding. 

## 2021-05-15 NOTE — Telephone Encounter (Signed)
TC from pt, seen yesterday for RLL skin tear, today she has swelling in ankle and redness below the bandage. Do not have an opening for today. Made an appt for tomorrow with Dr. Warrick Parisian at 10:55 Any other recommendations

## 2021-05-16 ENCOUNTER — Other Ambulatory Visit: Payer: Self-pay

## 2021-05-16 ENCOUNTER — Encounter: Payer: Self-pay | Admitting: Family Medicine

## 2021-05-16 ENCOUNTER — Ambulatory Visit (INDEPENDENT_AMBULATORY_CARE_PROVIDER_SITE_OTHER): Payer: PPO | Admitting: Family Medicine

## 2021-05-16 VITALS — BP 165/56 | HR 59 | Ht 65.0 in | Wt 106.0 lb

## 2021-05-16 DIAGNOSIS — S81811D Laceration without foreign body, right lower leg, subsequent encounter: Secondary | ICD-10-CM

## 2021-05-16 MED ORDER — CEPHALEXIN 500 MG PO CAPS: 500.0000 mg | ORAL_CAPSULE | Freq: Two times a day (BID) | ORAL | 0 refills | Status: DC

## 2021-05-16 NOTE — Progress Notes (Signed)
BP (!) 165/56   Pulse (!) 59   Ht 5\' 5"  (1.651 m)   Wt 106 lb (48.1 kg)   SpO2 97%   BMI 17.64 kg/m    Subjective:   Patient ID: [name], female    DOB: 05-09-1934, 87 y.o.   MRN: [id number]  HPI: [name] is a 87 y.o. female presenting on 05/16/2021 for Wound Check (LLE, Hit with foot while sleeping)   HPI Patient is coming in for left lower extremity wound recheck.  Just over a week ago she hit the front of her left leg on her bed getting out of bed and came in last week and had it wrapped but she feels like it is looking a little more red and starting to drain a little bit more and wanted to get it checked out here in the office.  She denies any fevers or chills.  She says it is very tender on that leg and then she is also getting some swelling down in the foot that was not there previous to last week.  Relevant past medical, surgical, family and social history reviewed and updated as indicated. Interim medical history since our last visit reviewed. Allergies and medications reviewed and updated.  Review of Systems  Constitutional:  Negative for appetite change, chills and fever.  Cardiovascular:  Positive for leg swelling.  Skin:  Positive for color change and wound.   Per HPI unless specifically indicated above   Allergies as of 05/16/2021       Reactions   Amlodipine Other (See Comments)   edema   Isosorbide Nitrate Nausea Only   Sulfonamide Derivatives Nausea Only, Rash   Terazosin Hives, Nausea Only        Medication List        Accurate as of May 16, 2021 11:25 AM. If you have any questions, ask your nurse or doctor.          acetaminophen 325 MG tablet Commonly known as: TYLENOL Take 325 mg by mouth daily as needed for moderate pain or headache.   atenolol-chlorthalidone 50-25 MG tablet Commonly known as: TENORETIC TAKE 1 TABLET BY MOUTH EVERY DAY   atorvastatin 20 MG tablet Commonly known as: LIPITOR TAKE 1/2 TABLET BY MOUTH  EVERY DAY   calcium-vitamin D 500-200 MG-UNIT tablet Commonly known as: OSCAL WITH D Take 2 tablets by mouth at bedtime.   cephALEXin 500 MG capsule Commonly known as: KEFLEX Take 1 capsule (500 mg total) by mouth 2 (two) times daily. Started by: Worthy Rancher, MD   cholecalciferol 1000 units tablet Commonly known as: VITAMIN D Take 1,000 Units by mouth daily.   diclofenac Sodium 1 % Gel Commonly known as: VOLTAREN Apply 2 g topically 4 (four) times daily.   ferrous sulfate 325 (65 FE) MG tablet Take 325 mg by mouth 2 (two) times daily with a meal.   flecainide 50 MG tablet Commonly known as: TAMBOCOR TAKE 1 TABLET (50 MG TOTAL) BY MOUTH 2 (TWO) TIMES DAILY. NEED OV.   fluticasone 50 MCG/ACT nasal spray Commonly known as: FLONASE Place 1 spray into both nostrils 2 (two) times daily.   hydrALAZINE 100 MG tablet Commonly known as: APRESOLINE Take 100 mg by mouth 2 (two) times daily.   hydroxypropyl methylcellulose / hypromellose 2.5 % ophthalmic solution Commonly known as: ISOPTO TEARS / GONIOVISC Place 1 drop into both eyes daily.   letrozole 2.5 MG tablet Commonly known as: FEMARA TAKE 1 TABLET BY  MOUTH EVERY DAY   levothyroxine 75 MCG tablet Commonly known as: SYNTHROID Take 1 tablet (75 mcg total) by mouth daily.   lisinopril 20 MG tablet Commonly known as: ZESTRIL Take 20 mg by mouth in the morning and at bedtime. Per Dr Posey Pronto 01/2021   MIRALAX PO Take by mouth. Takes one capful daily   omeprazole 10 MG capsule Commonly known as: PRILOSEC Take 1 capsule (10 mg total) by mouth daily.   ondansetron 4 MG disintegrating tablet Commonly known as: Zofran ODT Take 1 tablet (4 mg total) by mouth every 8 (eight) hours as needed.         Objective:   BP (!) 165/56   Pulse (!) 59   Ht 5\' 5"  (1.651 m)   Wt 106 lb (48.1 kg)   SpO2 97%   BMI 17.64 kg/m   Wt Readings from Last 3 Encounters:  05/16/21 106 lb (48.1 kg)  05/14/21 106 lb 12.8 oz (48.4  kg)  05/07/21 101 lb 12.8 oz (46.2 kg)    Physical Exam Vitals and nursing note reviewed.  Constitutional:      Appearance: Normal appearance.  Skin:    General: Skin is warm and dry.     Findings: Erythema and wound present.       Neurological:     Mental Status: She is alert.      Assessment & Plan:   Problem List Items Addressed This Visit       Musculoskeletal and Integument   Skin tear of right lower leg without complication - Primary   Relevant Medications   cephALEXin (KEFLEX) 500 MG capsule    We will do another short course antibiotics, recommend to keep mostly elevated and keep mostly dry at this point, only cover if draining with dry dressing. Follow up plan: Return if symptoms worsen or fail to improve.  Counseling provided for all of the vaccine components No orders of the defined types were placed in this encounter.   Caryl Pina, MD [facility] 05/16/2021, 11:25 AM

## 2021-05-17 ENCOUNTER — Ambulatory Visit: Payer: PPO | Admitting: Nurse Practitioner

## 2021-05-20 ENCOUNTER — Ambulatory Visit: Payer: PPO | Admitting: Nurse Practitioner

## 2021-05-20 ENCOUNTER — Ambulatory Visit (INDEPENDENT_AMBULATORY_CARE_PROVIDER_SITE_OTHER): Payer: PPO | Admitting: Family Medicine

## 2021-05-20 ENCOUNTER — Encounter: Payer: Self-pay | Admitting: Family Medicine

## 2021-05-20 ENCOUNTER — Other Ambulatory Visit: Payer: Self-pay

## 2021-05-20 VITALS — BP 135/55 | HR 64 | Ht 65.0 in | Wt 106.0 lb

## 2021-05-20 DIAGNOSIS — S81811D Laceration without foreign body, right lower leg, subsequent encounter: Secondary | ICD-10-CM | POA: Diagnosis not present

## 2021-05-20 NOTE — Progress Notes (Signed)
BP (!) 135/55   Pulse 64   Ht 5\' 5"  (1.651 m)   Wt 106 lb (48.1 kg)   SpO2 97%   BMI 17.64 kg/m    Subjective:   Patient ID: [name], female    DOB: [birth year], 87 y.o.   MRN: [id number]  HPI: [name] is a 87 y.o. female presenting on 05/20/2021 for Wound Check (LLE- 6 day follow up)   HPI Patient is coming in today for wound recheck on left lower extremity on her shin.  The wound still looks like it has some edema and purulence behind it under the skin tear area.  Patient is very tender to palpation but her legs of always been very tender to palpation.  She is still taking the Keflex and has 3 more days of it.  She denies any fevers or chills  Relevant past medical, surgical, family and social history reviewed and updated as indicated. Interim medical history since our last visit reviewed. Allergies and medications reviewed and updated.  Review of Systems  Constitutional:  Negative for chills and fever.  Eyes:  Negative for visual disturbance.  Respiratory:  Negative for chest tightness and shortness of breath.   Cardiovascular:  Negative for chest pain and leg swelling.  Skin:  Positive for wound. Negative for color change and rash.  Neurological:  Negative for light-headedness and headaches.  Psychiatric/Behavioral:  Negative for agitation and behavioral problems.   All other systems reviewed and are negative.  Per HPI unless specifically indicated above   Allergies as of 05/20/2021       Reactions   Amlodipine Other (See Comments)   edema   Isosorbide Nitrate Nausea Only   Sulfonamide Derivatives Nausea Only, Rash   Terazosin Hives, Nausea Only        Medication List        Accurate as of May 20, 2021  2:08 PM. If you have any questions, ask your nurse or doctor.          acetaminophen 325 MG tablet Commonly known as: TYLENOL Take 325 mg by mouth daily as needed for moderate pain or headache.   atenolol-chlorthalidone 50-25 MG  tablet Commonly known as: TENORETIC TAKE 1 TABLET BY MOUTH EVERY DAY   atorvastatin 20 MG tablet Commonly known as: LIPITOR TAKE 1/2 TABLET BY MOUTH EVERY DAY   calcium-vitamin D 500-200 MG-UNIT tablet Commonly known as: OSCAL WITH D Take 2 tablets by mouth at bedtime.   cephALEXin 500 MG capsule Commonly known as: KEFLEX Take 1 capsule (500 mg total) by mouth 2 (two) times daily.   cholecalciferol 1000 units tablet Commonly known as: VITAMIN D Take 1,000 Units by mouth daily.   diclofenac Sodium 1 % Gel Commonly known as: VOLTAREN Apply 2 g topically 4 (four) times daily.   ferrous sulfate 325 (65 FE) MG tablet Take 325 mg by mouth 2 (two) times daily with a meal.   flecainide 50 MG tablet Commonly known as: TAMBOCOR TAKE 1 TABLET (50 MG TOTAL) BY MOUTH 2 (TWO) TIMES DAILY. NEED OV.   fluticasone 50 MCG/ACT nasal spray Commonly known as: FLONASE Place 1 spray into both nostrils 2 (two) times daily.   hydrALAZINE 100 MG tablet Commonly known as: APRESOLINE Take 100 mg by mouth 2 (two) times daily.   hydroxypropyl methylcellulose / hypromellose 2.5 % ophthalmic solution Commonly known as: ISOPTO TEARS / GONIOVISC Place 1 drop into both eyes daily.   letrozole 2.5 MG tablet Commonly known  asAlysia Penna TAKE 1 TABLET BY MOUTH EVERY DAY   levothyroxine 75 MCG tablet Commonly known as: SYNTHROID Take 1 tablet (75 mcg total) by mouth daily.   lisinopril 20 MG tablet Commonly known as: ZESTRIL Take 20 mg by mouth in the morning and at bedtime. Per Dr Posey Pronto 01/2021   MIRALAX PO Take by mouth. Takes one capful daily   omeprazole 10 MG capsule Commonly known as: PRILOSEC Take 1 capsule (10 mg total) by mouth daily.   ondansetron 4 MG disintegrating tablet Commonly known as: Zofran ODT Take 1 tablet (4 mg total) by mouth every 8 (eight) hours as needed.         Objective:   BP (!) 135/55   Pulse 64   Ht 5\' 5"  (1.651 m)   Wt 106 lb (48.1 kg)   SpO2 97%    BMI 17.64 kg/m   Wt Readings from Last 3 Encounters:  05/20/21 106 lb (48.1 kg)  05/16/21 106 lb (48.1 kg)  05/14/21 106 lb 12.8 oz (48.4 kg)    Physical Exam Vitals and nursing note reviewed.  Constitutional:      Appearance: Normal appearance.  Skin:      Neurological:     Mental Status: She is alert.    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  Follow up plan: Return in about 4 days (around 05/24/2021), or if symptoms worsen or fail to improve, for Wound recheck.  Counseling provided for all of the vaccine components No orders of the defined types were placed in this encounter.   Caryl Pina, MD [facility] 05/20/2021, 2:08 PM

## 2021-05-22 DIAGNOSIS — S81802D Unspecified open wound, left lower leg, subsequent encounter: Secondary | ICD-10-CM | POA: Diagnosis not present

## 2021-05-24 ENCOUNTER — Encounter: Payer: Self-pay | Admitting: Family Medicine

## 2021-05-24 ENCOUNTER — Ambulatory Visit (INDEPENDENT_AMBULATORY_CARE_PROVIDER_SITE_OTHER): Payer: PPO | Admitting: Family Medicine

## 2021-05-24 ENCOUNTER — Other Ambulatory Visit: Payer: Self-pay

## 2021-05-24 VITALS — BP 139/50 | HR 59 | Temp 97.3°F | Ht 65.0 in | Wt 109.0 lb

## 2021-05-24 DIAGNOSIS — S81811D Laceration without foreign body, right lower leg, subsequent encounter: Secondary | ICD-10-CM

## 2021-05-24 NOTE — Progress Notes (Signed)
BP (!) 139/50   Pulse (!) 59   Temp (!) 97.3 F (36.3 C)   Ht 5\' 5"  (1.651 m)   Wt 109 lb (49.4 kg)   SpO2 97%   BMI 18.14 kg/m    Subjective:   Patient ID: [name], female    DOB: 09-11-1933, 87 y.o.   MRN: [id number]  HPI: [name] is a 87 y.o. female presenting on 05/24/2021 for Wound Check   HPI Wound recheck Patient is coming in for wound care recheck.  She seems to be doing well.  She did Unna boot last week and it does seem to be mostly healing up.  She denies any drainage.  She says the pain is a lot better.  She denies any fevers or chills.  Relevant past medical, surgical, family and social history reviewed and updated as indicated. Interim medical history since our last visit reviewed. Allergies and medications reviewed and updated.  Review of Systems  Constitutional:  Negative for chills and fever.  Eyes:  Negative for visual disturbance.  Respiratory:  Negative for chest tightness and shortness of breath.   Cardiovascular:  Negative for chest pain and leg swelling.  Musculoskeletal:  Negative for back pain and gait problem.  Skin:  Positive for wound. Negative for color change and rash.  Neurological:  Negative for light-headedness and headaches.  Psychiatric/Behavioral:  Negative for agitation and behavioral problems.   All other systems reviewed and are negative.  Per HPI unless specifically indicated above   Allergies as of 05/24/2021       Reactions   Amlodipine Other (See Comments)   edema   Isosorbide Nitrate Nausea Only   Sulfonamide Derivatives Nausea Only, Rash   Terazosin Hives, Nausea Only        Medication List        Accurate as of May 24, 2021  2:40 PM. If you have any questions, ask your nurse or doctor.          acetaminophen 325 MG tablet Commonly known as: TYLENOL Take 325 mg by mouth daily as needed for moderate pain or headache.   atenolol-chlorthalidone 50-25 MG tablet Commonly known as:  TENORETIC TAKE 1 TABLET BY MOUTH EVERY DAY   atorvastatin 20 MG tablet Commonly known as: LIPITOR TAKE 1/2 TABLET BY MOUTH EVERY DAY   calcium-vitamin D 500-200 MG-UNIT tablet Commonly known as: OSCAL WITH D Take 2 tablets by mouth at bedtime.   cephALEXin 500 MG capsule Commonly known as: KEFLEX Take 1 capsule (500 mg total) by mouth 2 (two) times daily.   cholecalciferol 1000 units tablet Commonly known as: VITAMIN D Take 1,000 Units by mouth daily.   diclofenac Sodium 1 % Gel Commonly known as: VOLTAREN Apply 2 g topically 4 (four) times daily.   ferrous sulfate 325 (65 FE) MG tablet Take 325 mg by mouth 2 (two) times daily with a meal.   flecainide 50 MG tablet Commonly known as: TAMBOCOR TAKE 1 TABLET (50 MG TOTAL) BY MOUTH 2 (TWO) TIMES DAILY. NEED OV.   fluticasone 50 MCG/ACT nasal spray Commonly known as: FLONASE Place 1 spray into both nostrils 2 (two) times daily.   hydrALAZINE 100 MG tablet Commonly known as: APRESOLINE Take 100 mg by mouth 2 (two) times daily.   hydroxypropyl methylcellulose / hypromellose 2.5 % ophthalmic solution Commonly known as: ISOPTO TEARS / GONIOVISC Place 1 drop into both eyes daily.   letrozole 2.5 MG tablet Commonly known as: FEMARA TAKE 1  TABLET BY MOUTH EVERY DAY   levothyroxine 75 MCG tablet Commonly known as: SYNTHROID Take 1 tablet (75 mcg total) by mouth daily.   lisinopril 20 MG tablet Commonly known as: ZESTRIL Take 20 mg by mouth in the morning and at bedtime. Per Dr Posey Pronto 01/2021   MIRALAX PO Take by mouth. Takes one capful daily   omeprazole 10 MG capsule Commonly known as: PRILOSEC Take 1 capsule (10 mg total) by mouth daily.   ondansetron 4 MG disintegrating tablet Commonly known as: Zofran ODT Take 1 tablet (4 mg total) by mouth every 8 (eight) hours as needed.         Objective:   BP (!) 139/50   Pulse (!) 59   Temp (!) 97.3 F (36.3 C)   Ht 5\' 5"  (1.651 m)   Wt 109 lb (49.4 kg)   SpO2  97%   BMI 18.14 kg/m   Wt Readings from Last 3 Encounters:  05/24/21 109 lb (49.4 kg)  05/20/21 106 lb (48.1 kg)  05/16/21 106 lb (48.1 kg)    Physical Exam Vitals and nursing note reviewed.  Skin:    General: Skin is warm and dry.     Findings: Wound present.         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  Patient tolerated well, return in 1 week.  Assessment & Plan:   Problem List Items Addressed This Visit       Musculoskeletal and Integument   Skin tear of right lower leg without complication - Primary  Looking good, continue the boot for 1 more week.  Follow up plan: Return in about 1 week (around 05/31/2021), or if symptoms worsen or fail to improve, for Wound recheck.  Counseling provided for all of the vaccine components No orders of the defined types were placed in this encounter.   Caryl Pina, MD [facility] 05/24/2021, 2:40 PM

## 2021-05-25 ENCOUNTER — Other Ambulatory Visit: Payer: Self-pay | Admitting: Cardiology

## 2021-05-27 ENCOUNTER — Ambulatory Visit (INDEPENDENT_AMBULATORY_CARE_PROVIDER_SITE_OTHER): Payer: PPO | Admitting: Gastroenterology

## 2021-05-30 ENCOUNTER — Encounter: Payer: Self-pay | Admitting: Family Medicine

## 2021-05-30 ENCOUNTER — Other Ambulatory Visit: Payer: Self-pay

## 2021-05-30 ENCOUNTER — Ambulatory Visit (INDEPENDENT_AMBULATORY_CARE_PROVIDER_SITE_OTHER): Payer: PPO | Admitting: Family Medicine

## 2021-05-30 VITALS — BP 132/54 | HR 60 | Ht 65.0 in | Wt 102.0 lb

## 2021-05-30 DIAGNOSIS — S81811D Laceration without foreign body, right lower leg, subsequent encounter: Secondary | ICD-10-CM

## 2021-05-30 NOTE — Progress Notes (Signed)
BP (!) 132/54   Pulse 60   Ht 5\' 5"  (1.651 m)   Wt 102 lb (46.3 kg)   SpO2 95%   BMI 16.97 kg/m    Subjective:   Patient ID: [name], female    DOB: [birth year], 87 y.o.   MRN: [id number]  HPI: [name] is a 87 y.o. female presenting on 05/30/2021 for Wound Check (LLE- improving per pt)   HPI Left lower extremity wound recheck Patient is coming in for left lower extremity wound recheck.  She has been doing Haematologist again for this past week and she feels like it is looking a lot better.  She denies any fevers or chills or redness or warmth.  She denies any tenderness around it and really feels like she is doing good.  Patient has done the [employer] for 2 weeks now.  Relevant past medical, surgical, family and social history reviewed and updated as indicated. Interim medical history since our last visit reviewed. Allergies and medications reviewed and updated.  Review of Systems  Skin:  Positive for wound. Negative for color change.   Per HPI unless specifically indicated above   Allergies as of 05/30/2021       Reactions   Amlodipine Other (See Comments)   edema   Xeroform Occlusive Gauze Strip [bismuth Tribromoph-petrolatum]    Isosorbide Nitrate Nausea Only   Sulfonamide Derivatives Nausea Only, Rash   Terazosin Hives, Nausea Only        Medication List        Accurate as of May 30, 2021  9:25 AM. If you have any questions, ask your nurse or doctor.          STOP taking these medications    cephALEXin 500 MG capsule Commonly known as: KEFLEX Stopped by: Fransisca Kaufmann Selma Mink, MD       TAKE these medications    acetaminophen 325 MG tablet Commonly known as: TYLENOL Take 325 mg by mouth daily as needed for moderate pain or headache.   atenolol-chlorthalidone 50-25 MG tablet Commonly known as: TENORETIC TAKE 1 TABLET BY MOUTH EVERY DAY   atorvastatin 20 MG tablet Commonly known as: LIPITOR TAKE 1/2 TABLET BY MOUTH EVERY DAY    calcium-vitamin D 500-200 MG-UNIT tablet Commonly known as: OSCAL WITH D Take 2 tablets by mouth at bedtime.   cholecalciferol 1000 units tablet Commonly known as: VITAMIN D Take 1,000 Units by mouth daily.   diclofenac Sodium 1 % Gel Commonly known as: VOLTAREN Apply 2 g topically 4 (four) times daily.   ferrous sulfate 325 (65 FE) MG tablet Take 325 mg by mouth 2 (two) times daily with a meal.   flecainide 50 MG tablet Commonly known as: TAMBOCOR Take 1 tablet (50 mg total) by mouth 2 (two) times daily. PATIENT NEEDS TO SCHEDULE AN OFFICE VISIT FOR FUTURE REFILLS.   fluticasone 50 MCG/ACT nasal spray Commonly known as: FLONASE Place 1 spray into both nostrils 2 (two) times daily.   hydrALAZINE 100 MG tablet Commonly known as: APRESOLINE Take 100 mg by mouth 2 (two) times daily.   hydroxypropyl methylcellulose / hypromellose 2.5 % ophthalmic solution Commonly known as: ISOPTO TEARS / GONIOVISC Place 1 drop into both eyes daily.   letrozole 2.5 MG tablet Commonly known as: FEMARA TAKE 1 TABLET BY MOUTH EVERY DAY   levothyroxine 75 MCG tablet Commonly known as: SYNTHROID Take 1 tablet (75 mcg total) by mouth daily.   lisinopril 20 MG tablet Commonly  known as: ZESTRIL Take 20 mg by mouth in the morning and at bedtime. Per Dr Posey Pronto 01/2021   MIRALAX PO Take by mouth. Takes one capful daily   omeprazole 10 MG capsule Commonly known as: PRILOSEC Take 1 capsule (10 mg total) by mouth daily.   ondansetron 4 MG disintegrating tablet Commonly known as: Zofran ODT Take 1 tablet (4 mg total) by mouth every 8 (eight) hours as needed.         Objective:   BP (!) 132/54   Pulse 60   Ht 5\' 5"  (1.651 m)   Wt 102 lb (46.3 kg)   SpO2 95%   BMI 16.97 kg/m   Wt Readings from Last 3 Encounters:  05/30/21 102 lb (46.3 kg)  05/24/21 109 lb (49.4 kg)  05/20/21 106 lb (48.1 kg)    Physical Exam Vitals and nursing note reviewed.  Constitutional:      Appearance:  Normal appearance.  Skin:    Findings: Wound (Left shin wound, scabbed over and dried and no induration or fluctuation or tenderness.) present.  Neurological:     Mental Status: She is alert.      Assessment & Plan:   Problem List Items Addressed This Visit       Musculoskeletal and Integument   Skin tear of right lower leg without complication - Primary    Wound looks good and scabbed over and dry and almost completely healed, recommended topical moisturizer and return as needed Follow up plan: Return if symptoms worsen or fail to improve.  Counseling provided for all of the vaccine components No orders of the defined types were placed in this encounter.   Caryl Pina, MD [facility] 05/30/2021, 9:25 AM

## 2021-06-03 ENCOUNTER — Telehealth (HOSPITAL_COMMUNITY): Payer: Self-pay | Admitting: Dietician

## 2021-06-03 ENCOUNTER — Other Ambulatory Visit: Payer: Self-pay | Admitting: Family Medicine

## 2021-06-03 ENCOUNTER — Telehealth: Payer: Self-pay | Admitting: Family Medicine

## 2021-06-03 ENCOUNTER — Encounter (HOSPITAL_COMMUNITY): Payer: PPO | Admitting: Dietician

## 2021-06-03 DIAGNOSIS — R1110 Vomiting, unspecified: Secondary | ICD-10-CM

## 2021-06-03 DIAGNOSIS — R110 Nausea: Secondary | ICD-10-CM

## 2021-06-03 NOTE — Telephone Encounter (Signed)
Nutrition Follow-up:  Patient with right breast cancer. She is receiving letrozole   Spoke with patient via telephone. She reports appetite has picked up and she is eating more. Patient reports she has gained 5 lbs. Yesterday she had prime rib, creamed potatoes, crowder peas, and peach cobbler for lunch. Patient ate chicken salad sandwich for dinner. She is planning to make a pot of vegetable soup today. Patient is drinking 1 ensure and 16-18 oz of water. Patient denies nutrition impact symptoms.  Medications: reviewed   Labs: 9/22 labs reviewed  Anthropometrics: Weight 102 lb on 10/13 decreased from 109 lb on 10/7 increased from 106 lb on 10/3. Patient reports she has gained 5 lbs and weighed 109 lb at appointment last week.    NUTRITION DIAGNOSIS: Inadequate oral intake improved per pt report   INTERVENTION:  Encouraged high calorie, high protein snacks in between meals - pt has handout  Continue drinking Ensure Plus/equivalent, suggested increasing to twice daily  Patient would like additional coupons - will mail these today Patient has contact information    MONITORING, EVALUATION, GOAL: weight trends, intake   NEXT VISIT: via telephone ~8 weeks

## 2021-06-03 NOTE — Telephone Encounter (Signed)
Talked with pt about having had this prescribed back in July for regurgitation of food, she said she got better from this and did not need it anymore. [facility] probably had her medicine on auto refill and sent a request & I refilled it since she had an upcoming appt. She will tell [facility] that she does not want it filled.

## 2021-06-07 ENCOUNTER — Ambulatory Visit: Payer: PPO | Admitting: Family Medicine

## 2021-06-12 ENCOUNTER — Other Ambulatory Visit: Payer: Self-pay

## 2021-06-12 ENCOUNTER — Ambulatory Visit (INDEPENDENT_AMBULATORY_CARE_PROVIDER_SITE_OTHER): Payer: PPO

## 2021-06-12 DIAGNOSIS — Z23 Encounter for immunization: Secondary | ICD-10-CM

## 2021-06-15 ENCOUNTER — Other Ambulatory Visit: Payer: Self-pay | Admitting: Cardiology

## 2021-06-24 ENCOUNTER — Other Ambulatory Visit: Payer: Self-pay

## 2021-06-24 ENCOUNTER — Other Ambulatory Visit: Payer: PPO

## 2021-06-24 DIAGNOSIS — E034 Atrophy of thyroid (acquired): Secondary | ICD-10-CM | POA: Diagnosis not present

## 2021-06-24 DIAGNOSIS — N1832 Chronic kidney disease, stage 3b: Secondary | ICD-10-CM

## 2021-06-24 DIAGNOSIS — I10 Essential (primary) hypertension: Secondary | ICD-10-CM

## 2021-06-24 DIAGNOSIS — I480 Paroxysmal atrial fibrillation: Secondary | ICD-10-CM

## 2021-06-24 DIAGNOSIS — D696 Thrombocytopenia, unspecified: Secondary | ICD-10-CM

## 2021-06-25 LAB — CMP14+EGFR
ALT: 14 IU/L (ref 0–32)
AST: 22 IU/L (ref 0–40)
Albumin/Globulin Ratio: 1.5 (ref 1.2–2.2)
Albumin: 4 g/dL (ref 3.6–4.6)
Alkaline Phosphatase: 68 IU/L (ref 44–121)
BUN/Creatinine Ratio: 20 (ref 12–28)
BUN: 31 mg/dL — ABNORMAL HIGH (ref 8–27)
Bilirubin Total: 0.4 mg/dL (ref 0.0–1.2)
CO2: 29 mmol/L (ref 20–29)
Calcium: 9.3 mg/dL (ref 8.7–10.3)
Chloride: 97 mmol/L (ref 96–106)
Creatinine, Ser: 1.57 mg/dL — ABNORMAL HIGH (ref 0.57–1.00)
Globulin, Total: 2.7 g/dL (ref 1.5–4.5)
Glucose: 103 mg/dL — ABNORMAL HIGH (ref 70–99)
Potassium: 4.4 mmol/L (ref 3.5–5.2)
Sodium: 138 mmol/L (ref 134–144)
Total Protein: 6.7 g/dL (ref 6.0–8.5)
eGFR: 32 mL/min/{1.73_m2} — ABNORMAL LOW (ref >59)

## 2021-06-25 LAB — LIPID PANEL
Chol/HDL Ratio: 2.3 ratio (ref 0.0–4.4)
Cholesterol, Total: 133 mg/dL (ref 100–199)
HDL: 57 mg/dL (ref >39)
LDL Chol Calc (NIH): 60 mg/dL (ref 0–99)
Triglycerides: 83 mg/dL (ref 0–149)
VLDL Cholesterol Cal: 16 mg/dL (ref 5–40)

## 2021-06-25 LAB — CBC WITH DIFFERENTIAL/PLATELET
Basophils Absolute: 0 10*3/uL (ref 0.0–0.2)
Basos: 0 %
EOS (ABSOLUTE): 0 10*3/uL (ref 0.0–0.4)
Eos: 1 %
Hematocrit: 30.5 % — ABNORMAL LOW (ref 34.0–46.6)
Hemoglobin: 9.9 g/dL — ABNORMAL LOW (ref 11.1–15.9)
Immature Grans (Abs): 0 10*3/uL (ref 0.0–0.1)
Immature Granulocytes: 1 %
Lymphocytes Absolute: 0.8 10*3/uL (ref 0.7–3.1)
Lymphs: 33 %
MCH: 28.5 pg (ref 26.6–33.0)
MCHC: 32.5 g/dL (ref 31.5–35.7)
MCV: 88 fL (ref 79–97)
Monocytes Absolute: 0.2 10*3/uL (ref 0.1–0.9)
Monocytes: 9 %
Neutrophils Absolute: 1.4 10*3/uL (ref 1.4–7.0)
Neutrophils: 56 %
Platelets: 95 10*3/uL — CL (ref 150–450)
RBC: 3.47 x10E6/uL — ABNORMAL LOW (ref 3.77–5.28)
RDW: 13.7 % (ref 11.7–15.4)
WBC: 2.5 10*3/uL — CL (ref 3.4–10.8)

## 2021-06-25 LAB — THYROID PANEL WITH TSH
Free Thyroxine Index: 3.2 (ref 1.2–4.9)
T3 Uptake Ratio: 33 % (ref 24–39)
T4, Total: 9.7 ug/dL (ref 4.5–12.0)
TSH: 1.89 u[IU]/mL (ref 0.450–4.500)

## 2021-06-26 DIAGNOSIS — Z23 Encounter for immunization: Secondary | ICD-10-CM | POA: Diagnosis not present

## 2021-07-01 ENCOUNTER — Other Ambulatory Visit: Payer: Self-pay

## 2021-07-01 ENCOUNTER — Encounter: Payer: Self-pay | Admitting: Family Medicine

## 2021-07-01 ENCOUNTER — Ambulatory Visit (INDEPENDENT_AMBULATORY_CARE_PROVIDER_SITE_OTHER): Payer: PPO | Admitting: Family Medicine

## 2021-07-01 VITALS — BP 125/52 | HR 56 | Temp 98.3°F | Ht 65.0 in | Wt 112.6 lb

## 2021-07-01 DIAGNOSIS — N1832 Chronic kidney disease, stage 3b: Secondary | ICD-10-CM | POA: Diagnosis not present

## 2021-07-01 DIAGNOSIS — I480 Paroxysmal atrial fibrillation: Secondary | ICD-10-CM

## 2021-07-01 DIAGNOSIS — D696 Thrombocytopenia, unspecified: Secondary | ICD-10-CM

## 2021-07-01 DIAGNOSIS — E034 Atrophy of thyroid (acquired): Secondary | ICD-10-CM | POA: Diagnosis not present

## 2021-07-01 DIAGNOSIS — S90522A Blister (nonthermal), left ankle, initial encounter: Secondary | ICD-10-CM

## 2021-07-01 DIAGNOSIS — T148XXA Other injury of unspecified body region, initial encounter: Secondary | ICD-10-CM

## 2021-07-01 MED ORDER — MUPIROCIN 2 % EX OINT: 1.0000 "application " | TOPICAL_OINTMENT | Freq: Two times a day (BID) | CUTANEOUS | 0 refills | Status: AC

## 2021-07-01 NOTE — Progress Notes (Signed)
Subjective: CC: Blood blister PCP: Janet Norlander, DO GMW:NUUVO B [name] is a 87 y.o. female presenting to clinic today for:  1.  Blood blister Patient reports that she sustained a blood blister along the left medial ankle after injuring her ankle.  It has since ruptured and she has been applying a bandage.  Denies any significant pain, drainage but wanted me to check this out.  She has history of cytopenia and is followed by oncology.  Next office visit scheduled in March   2.  CKD 3, A. fib Patient is compliant with all medications.  No reports of chest pain, shortness of breath (outside of her baseline with activity), heart palpitations  3.  Hypothyroidism Compliant with Synthroid.  No reports of tremor, change in voice or difficulty swallowing.  No change in bowel habits  ROS: Per HPI  Allergies  Allergen Reactions   Amlodipine Other (See Comments)    edema   Xeroform Occlusive Gauze Strip [Bismuth Tribromoph-Petrolatum]    Isosorbide Nitrate Nausea Only   Sulfonamide Derivatives Nausea Only and Rash   Terazosin Hives and Nausea Only   Past Medical History:  Diagnosis Date   Anemia    Arthritis    Asthma    Atrial fibrillation ([place])    Basal cell carcinoma 02/06/2009   Left ear targus- ([facility])   Basal cell carcinoma 09/28/2008   Right back-(CX35FU)   CKD (chronic kidney disease), stage III (HCC)    Heart murmur    Hyperlipidemia    Hypertension    Hypothyroidism    Nodule of right lung    Right upper lobe   PONV (postoperative nausea and vomiting)    Renal insufficiency    Chronic   Renal vascular disease    Right renal artery stenosis (HCC) 05/09/2015   Squamous cell carcinoma of skin 09/07/2019   KA-Left shin-txpbx   Vertigo     Current Outpatient Medications:    acetaminophen (TYLENOL) 325 MG tablet, Take 325 mg by mouth daily as needed for moderate pain or headache., Disp: , Rfl:    atenolol-chlorthalidone (TENORETIC) 50-25 MG tablet, TAKE 1  TABLET BY MOUTH EVERY DAY, Disp: 90 tablet, Rfl: 0   atorvastatin (LIPITOR) 20 MG tablet, TAKE 1/2 TABLET BY MOUTH EVERY DAY, Disp: 45 tablet, Rfl: 0   calcium-vitamin D (OSCAL WITH D) 500-200 MG-UNIT per tablet, Take 2 tablets by mouth at bedtime., Disp: , Rfl:    cholecalciferol (VITAMIN D) 1000 units tablet, Take 1,000 Units by mouth daily., Disp: , Rfl:    diclofenac Sodium (VOLTAREN) 1 % GEL, Apply 2 g topically 4 (four) times daily., Disp: 100 g, Rfl: 0   ferrous sulfate 325 (65 FE) MG tablet, Take 325 mg by mouth 2 (two) times daily with a meal., Disp: , Rfl:    flecainide (TAMBOCOR) 50 MG tablet, Take 1 tablet (50 mg total) by mouth 2 (two) times daily. PATIENT NEEDS TO KEEP SCHEDULED APPT FOR FUTURE REFILLS., Disp: 180 tablet, Rfl: 0   fluticasone (FLONASE) 50 MCG/ACT nasal spray, Place 1 spray into both nostrils 2 (two) times daily., Disp: 16 g, Rfl: 5   hydrALAZINE (APRESOLINE) 100 MG tablet, Take 100 mg by mouth 2 (two) times daily., Disp: , Rfl:    hydroxypropyl methylcellulose / hypromellose (ISOPTO TEARS / GONIOVISC) 2.5 % ophthalmic solution, Place 1 drop into both eyes daily., Disp: , Rfl:    letrozole (FEMARA) 2.5 MG tablet, TAKE 1 TABLET BY MOUTH EVERY DAY, Disp: 90 tablet, Rfl:  4   levothyroxine (SYNTHROID) 75 MCG tablet, Take 1 tablet (75 mcg total) by mouth daily., Disp: 90 tablet, Rfl: 3   lisinopril (ZESTRIL) 20 MG tablet, Take 20 mg by mouth in the morning and at bedtime. Per Dr Posey Pronto 01/2021, Disp: , Rfl:    omeprazole (PRILOSEC) 10 MG capsule, TAKE 1 CAPSULE BY MOUTH EVERY DAY, Disp: 90 capsule, Rfl: 0   ondansetron (ZOFRAN ODT) 4 MG disintegrating tablet, Take 1 tablet (4 mg total) by mouth every 8 (eight) hours as needed., Disp: 30 tablet, Rfl: 2   Polyethylene Glycol 3350 (MIRALAX PO), Take by mouth. Takes one capful daily, Disp: , Rfl:    mupirocin ointment (BACTROBAN) 2 %, Apply 1 application topically 2 (two) times daily for 5 days., Disp: 22 g, Rfl: 0 Social History    Socioeconomic History   Marital status: Widowed    Spouse name: Not on file   Number of children: 2   Years of education: 19   Highest education level: Not on file  Occupational History   Occupation: Retired  Tobacco Use   Smoking status: Never   Smokeless tobacco: Never  Vaping Use   Vaping Use: Never used  Substance and Sexual Activity   Alcohol use: No   Drug use: No   Sexual activity: Never  Other Topics Concern   Not on file  Social History Narrative   Lives alone, but her daughter stays there at night   Ambidextrous (writes with left hand).   No caffeine use.   Social Determinants of Health   Financial Resource Strain: Low Risk    Difficulty of Paying Living Expenses: Not hard at all  Food Insecurity: No Food Insecurity   Worried About Charity fundraiser in the Last Year: Never true   [place] in the Last Year: Never true  Transportation Needs: No Transportation Needs   Lack of Transportation (Medical): No   Lack of Transportation (Non-Medical): No  Physical Activity: Inactive   Days of Exercise per Week: 0 days   Minutes of Exercise per Session: 0 min  Stress: No Stress Concern Present   Feeling of Stress : Not at all  Social Connections: Moderately Isolated   Frequency of Communication with Friends and Family: More than three times a week   Frequency of Social Gatherings with Friends and Family: More than three times a week   Attends Religious Services: More than 4 times per year   Active Member of [employer] or Organizations: No   Attends Archivist Meetings: Never   Marital Status: Widowed  Human resources officer Violence: Not At Risk   Fear of Current or Ex-Partner: No   Emotionally Abused: No   Physically Abused: No   Sexually Abused: No   Family History  Problem Relation Age of Onset   Thyroid disease Mother    Stroke Father    Hypertension Brother    Lung cancer Maternal Uncle    Cancer Maternal Grandmother        Liver   Lung  cancer Maternal Uncle    Hypertension Daughter    Hypercholesterolemia Daughter    Hypercholesterolemia Daughter     Objective: Office vital signs reviewed. BP (!) 125/52   Pulse (!) 56   Temp 98.3 F (36.8 C)   Ht 5\' 5"  (1.651 m)   Wt 112 lb 9.6 oz (51.1 kg)   SpO2 96%   BMI 18.74 kg/m   Physical Examination:  General: Awake, alert, nontoxic-appearing  elderly female, No acute distress HEENT: Normal; sclera white.  No exophthalmos or goiter. Cardio: regular rate and rhythm, S1S2 heard, blowing systolic murmur appreciated throughout, radiating to the carotids bilaterally Pulm: clear to auscultation bilaterally, no wheezes, rhonchi or rales; normal work of breathing on room air Skin: Patient with about a dime sized blood blister that has ruptured along the medial ankle on the left.  No active exudates or drainage.  She has about 1/4 inch margin of erythema without significant induration nor warmth surrounding this lesion.  Assessment/ Plan: 87 y.o. female   Stage 3b chronic kidney disease ([place])  Paroxysmal atrial fibrillation ([place])  Hypothyroidism due to acquired atrophy of thyroid  Thrombocytopenia ([place])  Blood blister - Plan: mupirocin ointment (BACTROBAN) 2 %  Renal function was slightly reduced on her November labs compared to her September labs.  Her electrolytes were otherwise unchanged.  Liver enzymes normal.  She seemed to be both rate and rhythm controlled today.  Blowing murmur appreciated radiating to the carotids on exam.  Thyroid levels normal.  She will be getting a sooner appointment with her hematologist due to pancytopenia noted on last lab draw.  They request December appointment over March appointment.  Her ankle showed a blood blister that did not necessarily show evidence of infection but did have mild surrounding erythema some going to empirically treat her with a little bit of Bactroban just to be on the safe side.  I demarcated the borders and  advised her to contact me if the erythema extends beyond these borders.  We will need to proceed with oral antibiotics if that were the case  No orders of the defined types were placed in this encounter.  Meds ordered this encounter  Medications   mupirocin ointment (BACTROBAN) 2 %    Sig: Apply 1 application topically 2 (two) times daily for 5 days.    Dispense:  22 g    Refill:  [place], DO [place] [phone]

## 2021-07-06 ENCOUNTER — Other Ambulatory Visit: Payer: Self-pay | Admitting: Family Medicine

## 2021-07-06 DIAGNOSIS — I10 Essential (primary) hypertension: Secondary | ICD-10-CM

## 2021-07-25 ENCOUNTER — Other Ambulatory Visit: Payer: Self-pay

## 2021-07-25 ENCOUNTER — Inpatient Hospital Stay (HOSPITAL_COMMUNITY): Payer: PPO | Attending: Hematology

## 2021-07-25 ENCOUNTER — Encounter (INDEPENDENT_AMBULATORY_CARE_PROVIDER_SITE_OTHER): Payer: Self-pay | Admitting: Gastroenterology

## 2021-07-25 ENCOUNTER — Ambulatory Visit (INDEPENDENT_AMBULATORY_CARE_PROVIDER_SITE_OTHER): Payer: PPO | Admitting: Gastroenterology

## 2021-07-25 DIAGNOSIS — Z170 Estrogen receptor positive status [ER+]: Secondary | ICD-10-CM | POA: Insufficient documentation

## 2021-07-25 DIAGNOSIS — C50511 Malignant neoplasm of lower-outer quadrant of right female breast: Secondary | ICD-10-CM | POA: Insufficient documentation

## 2021-07-25 DIAGNOSIS — E441 Mild protein-calorie malnutrition: Secondary | ICD-10-CM

## 2021-07-25 DIAGNOSIS — R630 Anorexia: Secondary | ICD-10-CM

## 2021-07-25 DIAGNOSIS — E46 Unspecified protein-calorie malnutrition: Secondary | ICD-10-CM | POA: Insufficient documentation

## 2021-07-25 LAB — CBC WITH DIFFERENTIAL/PLATELET
Abs Immature Granulocytes: 0.03 10*3/uL (ref 0.00–0.07)
Basophils Absolute: 0 10*3/uL (ref 0.0–0.1)
Basophils Relative: 1 %
Eosinophils Absolute: 0 10*3/uL (ref 0.0–0.5)
Eosinophils Relative: 0 %
HCT: 27.9 % — ABNORMAL LOW (ref 36.0–46.0)
Hemoglobin: 9.1 g/dL — ABNORMAL LOW (ref 12.0–15.0)
Immature Granulocytes: 1 %
Lymphocytes Relative: 24 %
Lymphs Abs: 0.8 10*3/uL (ref 0.7–4.0)
MCH: 28.4 pg (ref 26.0–34.0)
MCHC: 32.6 g/dL (ref 30.0–36.0)
MCV: 87.2 fL (ref 80.0–100.0)
Monocytes Absolute: 0.3 10*3/uL (ref 0.1–1.0)
Monocytes Relative: 9 %
Neutro Abs: 2.2 10*3/uL (ref 1.7–7.7)
Neutrophils Relative %: 65 %
Platelets: 149 10*3/uL — ABNORMAL LOW (ref 150–400)
RBC: 3.2 MIL/uL — ABNORMAL LOW (ref 3.87–5.11)
RDW: 14.9 % (ref 11.5–15.5)
WBC: 3.4 10*3/uL — ABNORMAL LOW (ref 4.0–10.5)
nRBC: 0 % (ref 0.0–0.2)

## 2021-07-25 LAB — COMPREHENSIVE METABOLIC PANEL
ALT: 16 U/L (ref 0–44)
AST: 22 U/L (ref 15–41)
Albumin: 3.7 g/dL (ref 3.5–5.0)
Alkaline Phosphatase: 63 U/L (ref 38–126)
Anion gap: 8 (ref 5–15)
BUN: 33 mg/dL — ABNORMAL HIGH (ref 8–23)
CO2: 30 mmol/L (ref 22–32)
Calcium: 9 mg/dL (ref 8.9–10.3)
Chloride: 91 mmol/L — ABNORMAL LOW (ref 98–111)
Creatinine, Ser: 1.98 mg/dL — ABNORMAL HIGH (ref 0.44–1.00)
GFR, Estimated: 24 mL/min — ABNORMAL LOW (ref >60)
Glucose, Bld: 117 mg/dL — ABNORMAL HIGH (ref 70–99)
Potassium: 4.1 mmol/L (ref 3.5–5.1)
Sodium: 129 mmol/L — ABNORMAL LOW (ref 135–145)
Total Bilirubin: 0.4 mg/dL (ref 0.3–1.2)
Total Protein: 7.6 g/dL (ref 6.5–8.1)

## 2021-07-25 LAB — IRON AND TIBC
Iron: 46 ug/dL (ref 28–170)
Saturation Ratios: 16 % (ref 10.4–31.8)
TIBC: 292 ug/dL (ref 250–450)
UIBC: 246 ug/dL

## 2021-07-25 LAB — FERRITIN: Ferritin: 441 ng/mL — ABNORMAL HIGH (ref 11–307)

## 2021-07-25 MED ORDER — MIRTAZAPINE 7.5 MG PO TABS: 7.5000 mg | ORAL_TABLET | Freq: Every day | ORAL | 1 refills | Status: DC

## 2021-07-25 NOTE — Patient Instructions (Signed)
Start remeron 7.5 mg at bedtime

## 2021-07-25 NOTE — Progress Notes (Addendum)
[name], M.D. Gastroenterology & Hepatology [facility] [address] [place], [place] [address]  Primary Care Physician: [name], [place] [address]  I will communicate my assessment and recommendations to the referring MD via EMR.  Problems: Decreased appetite Esophageal candidiasis  Constipation  History of Present Illness: [name] is a 87 y.o. female past medical history of asthma, a fib, BCC, CKD, HLD, HTN, hypothyroidism, who presents for follow up of decreased appetite.  The patient was last seen on 04/08/2021. At that time, the patient was scheduled to undergo an EGD for evaluation of weight loss and poor appetite.  She was advised to take protein shakes which she has been taking compliantly twice a day.  Was also advised to take MiraLAX for her constipation and Zofran as needed if she presented nausea.    Last EGD was performed 04/09/2021 which showed white nummular mucosa in the esophagus consistent with Candida esophagitis based on biopsies, she had a 1 cm hiatal hernia, stomach was normal.  She had duodenal melanosis.  Patient was prescribed fluconazole for 21 days.  The patient and her daughter state that after she had her endoscopy her decreased appetite resolved completely even before taking the fluconazole.  She denies having any complaints for several months and states that she was able to have adequate swallowing without dysphagia or odynophagia.  However, the patient reports that last week she started presenting recurrent decreased appetite. States she has taken Zofran as needed 2-3 times in the last 3 months, which she reports has helped but she does not feel that much nauseated, but mostly just decreased appetite.  In fact, she reports that she gained some lb in the last 3 months - believes 7-8 lb. Does not think she has lost any weight this week.  The patient denies having  any nausea, vomiting, fever, chills, hematochezia, melena, hematemesis, abdominal distention, abdominal pain, diarrhea, jaundice, pruritus.  Last EGD: As above Last Colonoscopy: 2016, diverticulosis and hemorrhoids  Past Medical History: Past Medical History:  Diagnosis Date   Anemia    Arthritis    Asthma    Atrial fibrillation ([place])    Basal cell carcinoma 02/06/2009   Left ear targus- ([facility])   Basal cell carcinoma 09/28/2008   Right back-(CX35FU)   CKD (chronic kidney disease), stage III (HCC)    Heart murmur    Hyperlipidemia    Hypertension    Hypothyroidism    Nodule of right lung    Right upper lobe   PONV (postoperative nausea and vomiting)    Renal insufficiency    Chronic   Renal vascular disease    Right renal artery stenosis ([place]) 05/09/2015   Squamous cell carcinoma of skin 09/07/2019   KA-Left shin-txpbx   Vertigo     Past Surgical History: Past Surgical History:  Procedure Laterality Date   BIOPSY  04/09/2021   Procedure: BIOPSY;  Surgeon: Harvel Quale, MD;  [facility];  Service: Gastroenterology;;   BREAST LUMPECTOMY WITH RADIOACTIVE SEED LOCALIZATION Right 04/08/2018   Procedure: BREAST LUMPECTOMY WITH RADIOACTIVE SEED LOCALIZATION;  Surgeon: Rolm Bookbinder, MD;  Location: [facility];  Service: General;  Laterality: Right;   COLONOSCOPY  2016   diverticulosis, hemorrhoids   ESOPHAGOGASTRODUODENOSCOPY  2016   normal   ESOPHAGOGASTRODUODENOSCOPY (EGD) WITH PROPOFOL N/A 04/09/2021   Procedure: ESOPHAGOGASTRODUODENOSCOPY (EGD) WITH PROPOFOL;  Surgeon: Harvel Quale, MD;  [facility];  Service: Gastroenterology;  Laterality: N/A;  12:45   IR GENERIC HISTORICAL  08/29/2016   IR US GUIDE VASC ACCESS RIGHT 08/29/2016 [facility]   IR GENERIC HISTORICAL  08/29/2016   IR RENAL BILAT S&I MOD SED 08/29/2016 [facility]   KNEE ARTHROSCOPY Right    2019   THYROIDECTOMY, PARTIAL Right 1981   middle lobe removed  1st, then right lobe removed 7-8 years later (approx. 68)    Family History: Family History  Problem Relation Age of Onset   Thyroid disease Mother    Stroke Father    Hypertension Brother    Lung cancer Maternal Uncle    Cancer Maternal Grandmother        Liver   Lung cancer Maternal Uncle    Hypertension Daughter    Hypercholesterolemia Daughter    Hypercholesterolemia Daughter     Social History: Social History   Tobacco Use  Smoking Status Never  Smokeless Tobacco Never   Social History   Substance and Sexual Activity  Alcohol Use No   Social History   Substance and Sexual Activity  Drug Use No    Allergies: Allergies  Allergen Reactions   Amlodipine Other (See Comments)    edema   Xeroform Occlusive Gauze Strip [Bismuth Tribromoph-Petrolatum]    Isosorbide Nitrate Nausea Only   Sulfonamide Derivatives Nausea Only and Rash   Terazosin Hives and Nausea Only    Medications: Current Outpatient Medications  Medication Sig Dispense Refill   acetaminophen (TYLENOL) 325 MG tablet Take 325 mg by mouth daily as needed for moderate pain or headache.     atenolol-chlorthalidone (TENORETIC) 50-25 MG tablet TAKE 1 TABLET BY MOUTH EVERY DAY 90 tablet 1   atorvastatin (LIPITOR) 20 MG tablet TAKE 1/2 TABLET BY MOUTH EVERY DAY 45 tablet 1   calcium-vitamin D (OSCAL WITH D) 500-200 MG-UNIT per tablet Take 2 tablets by mouth at bedtime.     cholecalciferol (VITAMIN D) 1000 units tablet Take 1,000 Units by mouth daily.     diclofenac Sodium (VOLTAREN) 1 % GEL Apply 2 g topically 4 (four) times daily. 100 g 0   ferrous sulfate 325 (65 FE) MG tablet Take 325 mg by mouth 2 (two) times daily with a meal.     flecainide (TAMBOCOR) 50 MG tablet Take 1 tablet (50 mg total) by mouth 2 (two) times daily. PATIENT NEEDS TO KEEP SCHEDULED APPT FOR FUTURE REFILLS. 180 tablet 0   fluticasone (FLONASE) 50 MCG/ACT nasal spray Place 1 spray into both nostrils 2 (two) times daily. 16 g 5    hydrALAZINE (APRESOLINE) 100 MG tablet Take 100 mg by mouth 2 (two) times daily.     hydroxypropyl methylcellulose / hypromellose (ISOPTO TEARS / GONIOVISC) 2.5 % ophthalmic solution Place 1 drop into both eyes daily.     letrozole (FEMARA) 2.5 MG tablet TAKE 1 TABLET BY MOUTH EVERY DAY 90 tablet 4   levothyroxine (SYNTHROID) 75 MCG tablet Take 1 tablet (75 mcg total) by mouth daily. 90 tablet 3   lisinopril (ZESTRIL) 20 MG tablet Take 20 mg by mouth in the morning and at bedtime. Per Dr Posey Pronto 01/2021     ondansetron (ZOFRAN ODT) 4 MG disintegrating tablet Take 1 tablet (4 mg total) by mouth every 8 (eight) hours as needed. 30 tablet 2   Polyethylene Glycol 3350 (MIRALAX PO) Take by mouth. Takes one capful daily     No current facility-administered medications for this visit.    Review of Systems: GENERAL: negative for  malaise, night sweats HEENT: No changes in hearing or vision, no nose bleeds or other nasal problems. NECK: Negative for lumps, goiter, pain and significant neck swelling RESPIRATORY: Negative for cough, wheezing CARDIOVASCULAR: Negative for chest pain, leg swelling, palpitations, orthopnea GI: SEE HPI MUSCULOSKELETAL: Negative for joint pain or swelling, back pain, and muscle pain. SKIN: Negative for lesions, rash PSYCH: Negative for sleep disturbance, mood disorder and recent psychosocial stressors. HEMATOLOGY Negative for prolonged bleeding, bruising easily, and swollen nodes. ENDOCRINE: Negative for cold or heat intolerance, polyuria, polydipsia and goiter. NEURO: negative for tremor, gait imbalance, syncope and seizures. The remainder of the review of systems is noncontributory.   Physical Exam: BP (!) 164/61 (BP Location: Left Arm, Patient Position: Sitting, Cuff Size: Small)   Pulse (!) 53   Temp 98.3 F (36.8 C) (Oral)   Ht 5' 5.5" (1.664 m)   Wt 109 lb (49.4 kg)   BMI 17.86 kg/m  GENERAL: The patient is AO x3, in no acute distress. HEENT: Head is  normocephalic and atraumatic. EOMI are intact. Mouth is well hydrated and without lesions. NECK: Supple. No masses LUNGS: Clear to auscultation. No presence of rhonchi/wheezing/rales. Adequate chest expansion HEART: RRR, normal s1 and s2. ABDOMEN: Soft, nontender, no guarding, no peritoneal signs, and nondistended. BS +. No masses. EXTREMITIES: Without any cyanosis, clubbing, rash, lesions or edema. NEUROLOGIC: AOx3, no focal motor deficit. SKIN: no jaundice, no rashes  Imaging/Labs: as above  I personally reviewed and interpreted the available labs, imaging and endoscopic files.  Impression and Plan: RAPHAELA CANNADAY is a 87 y.o. female past medical history of asthma, a fib, BCC, CKD, HLD, HTN, hypothyroidism, who presents for follow up of decreased appetite.  She has had a thorough work-up which includes CT of the abdomen and an EGD in the last year and a half without a clear source for her decreased appetite.  She is not taking any medications that would decrease her appetite and it is not clear why she had resolution of the hyporexia in the past.  She has gained some weight which is reassuring.  I do not consider that her candidiasis was the cause of her decreased appetite.  Furthermore, she had resolution of her hyporexia even before receiving treatment for candidiasis.  I have thorough discussion with the patient and the daughter regarding starting a low-dose Remeron to improve her appetite.  Both seem to be interested in this and agreed on the plan. She should continue the protein supplements.  - Start remeron 7.5 mg at bedtime - RTC 3 months  All questions were answered.      Harvel Quale, MD Gastroenterology and Hepatology [facility]

## 2021-07-26 DIAGNOSIS — Z95828 Presence of other vascular implants and grafts: Secondary | ICD-10-CM | POA: Diagnosis not present

## 2021-07-26 DIAGNOSIS — I701 Atherosclerosis of renal artery: Secondary | ICD-10-CM | POA: Diagnosis not present

## 2021-07-26 DIAGNOSIS — I129 Hypertensive chronic kidney disease with stage 1 through stage 4 chronic kidney disease, or unspecified chronic kidney disease: Secondary | ICD-10-CM | POA: Diagnosis not present

## 2021-07-26 DIAGNOSIS — D61818 Other pancytopenia: Secondary | ICD-10-CM | POA: Diagnosis not present

## 2021-07-26 DIAGNOSIS — N184 Chronic kidney disease, stage 4 (severe): Secondary | ICD-10-CM | POA: Diagnosis not present

## 2021-07-31 ENCOUNTER — Ambulatory Visit (INDEPENDENT_AMBULATORY_CARE_PROVIDER_SITE_OTHER): Payer: PPO | Admitting: Family Medicine

## 2021-07-31 DIAGNOSIS — J3489 Other specified disorders of nose and nasal sinuses: Secondary | ICD-10-CM

## 2021-07-31 DIAGNOSIS — J069 Acute upper respiratory infection, unspecified: Secondary | ICD-10-CM | POA: Diagnosis not present

## 2021-07-31 MED ORDER — AZELASTINE HCL 0.1 % NA SOLN: 1.0000 | Freq: Two times a day (BID) | NASAL | 12 refills | Status: DC

## 2021-07-31 MED ORDER — BENZONATATE 200 MG PO CAPS: 200.0000 mg | ORAL_CAPSULE | Freq: Two times a day (BID) | ORAL | 0 refills | Status: DC | PRN

## 2021-07-31 NOTE — Progress Notes (Signed)
Telephone visit  Subjective: Janet symptoms PCP: Janora [name], [name] is a 87 y.o. female calls for telephone consult today. Patient provides verbal consent for consult held via phone.  Due to COVID-19 pandemic this visit was conducted virtually. This visit type was conducted due to national recommendations for restrictions regarding the COVID-19 Pandemic (e.g. social distancing, sheltering in place) in an effort to limit this patient's exposure and mitigate transmission in our community. All issues noted in this document were discussed and addressed.  A physical exam was not performed with this format.   Location of patient: home Location of provider: [facility] Others present for call: none  1. Cold Patient reports onset of cough, nasal drainage, sore throat Sunday PM.  She is UTD on COVID and flu shots.  Home test for COVID was negative. Using robitussin DM which is helping some with cough.  No fevers, myalgia or diarrhea.  No known sick contacts. No shortness of breath or wheezing. No hemoptysis or brown sputum.   ROS: Per HPI  Allergies  Allergen Reactions   Amlodipine Other (See Comments)    edema   Xeroform Occlusive Gauze Strip [Bismuth Tribromoph-Petrolatum]    Isosorbide Nitrate Nausea Only   Sulfonamide Derivatives Nausea Only and Rash   Terazosin Hives and Nausea Only   Past Medical History:  Diagnosis Date   Anemia    Arthritis    Asthma    Atrial fibrillation ([place])    Basal cell carcinoma 02/06/2009   Left ear targus- ([facility])   Basal cell carcinoma 09/28/2008   Right back-(CX35FU)   CKD (chronic kidney disease), stage III (HCC)    Heart murmur    Hyperlipidemia    Hypertension    Hypothyroidism    Nodule of right lung    Right upper lobe   PONV (postoperative nausea and vomiting)    Renal insufficiency    Chronic   Renal vascular disease    Right renal artery stenosis (HCC) 05/09/2015   Squamous cell carcinoma of skin 09/07/2019    KA-Left shin-txpbx   Vertigo     Current Outpatient Medications:    acetaminophen (TYLENOL) 325 MG tablet, Take 325 mg by mouth daily as needed for moderate pain or headache., Disp: , Rfl:    atenolol-chlorthalidone (TENORETIC) 50-25 MG tablet, TAKE 1 TABLET BY MOUTH EVERY DAY, Disp: 90 tablet, Rfl: 1   atorvastatin (LIPITOR) 20 MG tablet, TAKE 1/2 TABLET BY MOUTH EVERY DAY, Disp: 45 tablet, Rfl: 1   calcium-vitamin D (OSCAL WITH D) 500-200 MG-UNIT per tablet, Take 2 tablets by mouth at bedtime., Disp: , Rfl:    cholecalciferol (VITAMIN D) 1000 units tablet, Take 1,000 Units by mouth daily., Disp: , Rfl:    diclofenac Sodium (VOLTAREN) 1 % GEL, Apply 2 g topically 4 (four) times daily., Disp: 100 g, Rfl: 0   ferrous sulfate 325 (65 FE) MG tablet, Take 325 mg by mouth 2 (two) times daily with a meal., Disp: , Rfl:    flecainide (TAMBOCOR) 50 MG tablet, Take 1 tablet (50 mg total) by mouth 2 (two) times daily. PATIENT NEEDS TO KEEP SCHEDULED APPT FOR FUTURE REFILLS., Disp: 180 tablet, Rfl: 0   fluticasone (FLONASE) 50 MCG/ACT nasal spray, Place 1 spray into both nostrils 2 (two) times daily., Disp: 16 g, Rfl: 5   hydrALAZINE (APRESOLINE) 100 MG tablet, Take 100 mg by mouth 2 (two) times daily., Disp: , Rfl:    hydroxypropyl methylcellulose / hypromellose (ISOPTO TEARS / GONIOVISC) 2.5 %  ophthalmic solution, Place 1 drop into both eyes daily., Disp: , Rfl:    letrozole (FEMARA) 2.5 MG tablet, TAKE 1 TABLET BY MOUTH EVERY DAY, Disp: 90 tablet, Rfl: 4   levothyroxine (SYNTHROID) 75 MCG tablet, Take 1 tablet (75 mcg total) by mouth daily., Disp: 90 tablet, Rfl: 3   lisinopril (ZESTRIL) 20 MG tablet, Take 20 mg by mouth in the morning and at bedtime. Per Dr Posey Pronto 01/2021, Disp: , Rfl:    mirtazapine (REMERON) 7.5 MG tablet, Take 1 tablet (7.5 mg total) by mouth at bedtime., Disp: 90 tablet, Rfl: 1   ondansetron (ZOFRAN ODT) 4 MG disintegrating tablet, Take 1 tablet (4 mg total) by mouth every 8 (eight)  hours as needed., Disp: 30 tablet, Rfl: 2   Polyethylene Glycol 3350 (MIRALAX PO), Take by mouth. Takes one capful daily, Disp: , Rfl:   Assessment/ Plan: 87 y.o. female   URI with cough and congestion - Plan: benzonatate (TESSALON) 200 MG capsule  Rhinorrhea - Plan: azelastine (ASTELIN) 0.1 % nasal spray  Possibly has influenza but she has been vaccinated against it and has no known sick contacts.  Certainly sounds viral what she has going on so we will symptomatically treat with Astelin and Tessalon Perles.  Push oral fluids.  We discussed red flag signs and symptoms warranting further evaluation.  She voiced good understanding will follow up as needed   Start time: 1:30pm End time: 1:36pm  Total time spent on patient care (including telephone call/ virtual visit): 6 minutes  [place], [place] [phone]

## 2021-08-01 ENCOUNTER — Ambulatory Visit (INDEPENDENT_AMBULATORY_CARE_PROVIDER_SITE_OTHER): Payer: PPO

## 2021-08-01 ENCOUNTER — Ambulatory Visit (INDEPENDENT_AMBULATORY_CARE_PROVIDER_SITE_OTHER): Payer: PPO | Admitting: Nurse Practitioner

## 2021-08-01 ENCOUNTER — Encounter (HOSPITAL_COMMUNITY): Payer: Self-pay | Admitting: *Deleted

## 2021-08-01 ENCOUNTER — Encounter: Payer: Self-pay | Admitting: Nurse Practitioner

## 2021-08-01 ENCOUNTER — Telehealth: Payer: Self-pay | Admitting: Family Medicine

## 2021-08-01 ENCOUNTER — Other Ambulatory Visit: Payer: Self-pay

## 2021-08-01 ENCOUNTER — Inpatient Hospital Stay (HOSPITAL_COMMUNITY)
Admission: EM | Admit: 2021-08-01 | Discharge: 2021-08-08 | DRG: 189 | Disposition: A | Discharge status: Home / Self Care | Payer: PPO | Source: Ambulatory Visit | Attending: Internal Medicine | Admitting: Internal Medicine

## 2021-08-01 VITALS — BP 190/68 | HR 64 | Temp 98.1°F | Resp 20 | Ht 65.0 in | Wt 109.0 lb

## 2021-08-01 DIAGNOSIS — E871 Hypo-osmolality and hyponatremia: Secondary | ICD-10-CM | POA: Diagnosis not present

## 2021-08-01 DIAGNOSIS — J310 Chronic rhinitis: Secondary | ICD-10-CM | POA: Diagnosis not present

## 2021-08-01 DIAGNOSIS — J45909 Unspecified asthma, uncomplicated: Secondary | ICD-10-CM | POA: Diagnosis present

## 2021-08-01 DIAGNOSIS — R0989 Other specified symptoms and signs involving the circulatory and respiratory systems: Secondary | ICD-10-CM

## 2021-08-01 DIAGNOSIS — Z823 Family history of stroke: Secondary | ICD-10-CM

## 2021-08-01 DIAGNOSIS — N179 Acute kidney failure, unspecified: Secondary | ICD-10-CM | POA: Diagnosis not present

## 2021-08-01 DIAGNOSIS — N1832 Chronic kidney disease, stage 3b: Secondary | ICD-10-CM | POA: Diagnosis present

## 2021-08-01 DIAGNOSIS — Z170 Estrogen receptor positive status [ER+]: Secondary | ICD-10-CM

## 2021-08-01 DIAGNOSIS — I5032 Chronic diastolic (congestive) heart failure: Secondary | ICD-10-CM | POA: Diagnosis not present

## 2021-08-01 DIAGNOSIS — D631 Anemia in chronic kidney disease: Secondary | ICD-10-CM | POA: Diagnosis not present

## 2021-08-01 DIAGNOSIS — I130 Hypertensive heart and chronic kidney disease with heart failure and stage 1 through stage 4 chronic kidney disease, or unspecified chronic kidney disease: Secondary | ICD-10-CM | POA: Diagnosis not present

## 2021-08-01 DIAGNOSIS — Z681 Body mass index (BMI) 19 or less, adult: Secondary | ICD-10-CM

## 2021-08-01 DIAGNOSIS — Z7401 Bed confinement status: Secondary | ICD-10-CM

## 2021-08-01 DIAGNOSIS — E039 Hypothyroidism, unspecified: Secondary | ICD-10-CM | POA: Diagnosis present

## 2021-08-01 DIAGNOSIS — N183 Chronic kidney disease, stage 3 unspecified: Secondary | ICD-10-CM | POA: Diagnosis present

## 2021-08-01 DIAGNOSIS — R0602 Shortness of breath: Secondary | ICD-10-CM | POA: Diagnosis not present

## 2021-08-01 DIAGNOSIS — B349 Viral infection, unspecified: Secondary | ICD-10-CM | POA: Diagnosis present

## 2021-08-01 DIAGNOSIS — Z66 Do not resuscitate: Secondary | ICD-10-CM | POA: Diagnosis present

## 2021-08-01 DIAGNOSIS — E440 Moderate protein-calorie malnutrition: Secondary | ICD-10-CM | POA: Diagnosis not present

## 2021-08-01 DIAGNOSIS — J189 Pneumonia, unspecified organism: Secondary | ICD-10-CM | POA: Diagnosis not present

## 2021-08-01 DIAGNOSIS — R011 Cardiac murmur, unspecified: Secondary | ICD-10-CM | POA: Diagnosis present

## 2021-08-01 DIAGNOSIS — E860 Dehydration: Secondary | ICD-10-CM | POA: Diagnosis not present

## 2021-08-01 DIAGNOSIS — I480 Paroxysmal atrial fibrillation: Secondary | ICD-10-CM | POA: Diagnosis present

## 2021-08-01 DIAGNOSIS — Z801 Family history of malignant neoplasm of trachea, bronchus and lung: Secondary | ICD-10-CM | POA: Diagnosis not present

## 2021-08-01 DIAGNOSIS — Z853 Personal history of malignant neoplasm of breast: Secondary | ICD-10-CM | POA: Diagnosis not present

## 2021-08-01 DIAGNOSIS — E785 Hyperlipidemia, unspecified: Secondary | ICD-10-CM | POA: Diagnosis present

## 2021-08-01 DIAGNOSIS — J9601 Acute respiratory failure with hypoxia: Principal | ICD-10-CM | POA: Diagnosis present

## 2021-08-01 DIAGNOSIS — Z79811 Long term (current) use of aromatase inhibitors: Secondary | ICD-10-CM | POA: Diagnosis not present

## 2021-08-01 DIAGNOSIS — Z20822 Contact with and (suspected) exposure to covid-19: Secondary | ICD-10-CM | POA: Diagnosis not present

## 2021-08-01 DIAGNOSIS — N1831 Chronic kidney disease, stage 3a: Secondary | ICD-10-CM | POA: Diagnosis not present

## 2021-08-01 DIAGNOSIS — Z79890 Hormone replacement therapy (postmenopausal): Secondary | ICD-10-CM

## 2021-08-01 DIAGNOSIS — Z882 Allergy status to sulfonamides status: Secondary | ICD-10-CM

## 2021-08-01 DIAGNOSIS — Z8249 Family history of ischemic heart disease and other diseases of the circulatory system: Secondary | ICD-10-CM

## 2021-08-01 DIAGNOSIS — J209 Acute bronchitis, unspecified: Secondary | ICD-10-CM | POA: Diagnosis present

## 2021-08-01 DIAGNOSIS — J9611 Chronic respiratory failure with hypoxia: Secondary | ICD-10-CM | POA: Diagnosis present

## 2021-08-01 DIAGNOSIS — I517 Cardiomegaly: Secondary | ICD-10-CM | POA: Diagnosis not present

## 2021-08-01 DIAGNOSIS — I701 Atherosclerosis of renal artery: Secondary | ICD-10-CM | POA: Diagnosis present

## 2021-08-01 DIAGNOSIS — Z8349 Family history of other endocrine, nutritional and metabolic diseases: Secondary | ICD-10-CM

## 2021-08-01 DIAGNOSIS — M1990 Unspecified osteoarthritis, unspecified site: Secondary | ICD-10-CM | POA: Diagnosis present

## 2021-08-01 DIAGNOSIS — D649 Anemia, unspecified: Secondary | ICD-10-CM

## 2021-08-01 DIAGNOSIS — Z888 Allergy status to other drugs, medicaments and biological substances status: Secondary | ICD-10-CM

## 2021-08-01 DIAGNOSIS — R059 Cough, unspecified: Secondary | ICD-10-CM

## 2021-08-01 DIAGNOSIS — E611 Iron deficiency: Secondary | ICD-10-CM | POA: Diagnosis not present

## 2021-08-01 DIAGNOSIS — R54 Age-related physical debility: Secondary | ICD-10-CM | POA: Diagnosis present

## 2021-08-01 DIAGNOSIS — Z79899 Other long term (current) drug therapy: Secondary | ICD-10-CM

## 2021-08-01 DIAGNOSIS — I10 Essential (primary) hypertension: Secondary | ICD-10-CM | POA: Diagnosis present

## 2021-08-01 DIAGNOSIS — Z85828 Personal history of other malignant neoplasm of skin: Secondary | ICD-10-CM

## 2021-08-01 LAB — CBC WITH DIFFERENTIAL/PLATELET
Abs Immature Granulocytes: 0.01 10*3/uL (ref 0.00–0.07)
Basophils Absolute: 0 10*3/uL (ref 0.0–0.1)
Basophils Relative: 0 %
Eosinophils Absolute: 0 10*3/uL (ref 0.0–0.5)
Eosinophils Relative: 0 %
HCT: 27.5 % — ABNORMAL LOW (ref 36.0–46.0)
Hemoglobin: 9.2 g/dL — ABNORMAL LOW (ref 12.0–15.0)
Immature Granulocytes: 0 %
Lymphocytes Relative: 7 %
Lymphs Abs: 0.3 10*3/uL — ABNORMAL LOW (ref 0.7–4.0)
MCH: 29.3 pg (ref 26.0–34.0)
MCHC: 33.5 g/dL (ref 30.0–36.0)
MCV: 87.6 fL (ref 80.0–100.0)
Monocytes Absolute: 0.3 10*3/uL (ref 0.1–1.0)
Monocytes Relative: 7 %
Neutro Abs: 3.9 10*3/uL (ref 1.7–7.7)
Neutrophils Relative %: 86 %
Platelets: 149 10*3/uL — ABNORMAL LOW (ref 150–400)
RBC: 3.14 MIL/uL — ABNORMAL LOW (ref 3.87–5.11)
RDW: 15 % (ref 11.5–15.5)
WBC: 4.6 10*3/uL (ref 4.0–10.5)
nRBC: 0 % (ref 0.0–0.2)

## 2021-08-01 LAB — COMPREHENSIVE METABOLIC PANEL
ALT: 16 U/L (ref 0–44)
AST: 26 U/L (ref 15–41)
Albumin: 3.8 g/dL (ref 3.5–5.0)
Alkaline Phosphatase: 59 U/L (ref 38–126)
Anion gap: 11 (ref 5–15)
BUN: 30 mg/dL — ABNORMAL HIGH (ref 8–23)
CO2: 27 mmol/L (ref 22–32)
Calcium: 8.7 mg/dL — ABNORMAL LOW (ref 8.9–10.3)
Chloride: 88 mmol/L — ABNORMAL LOW (ref 98–111)
Creatinine, Ser: 1.59 mg/dL — ABNORMAL HIGH (ref 0.44–1.00)
GFR, Estimated: 32 mL/min — ABNORMAL LOW (ref >60)
Glucose, Bld: 128 mg/dL — ABNORMAL HIGH (ref 70–99)
Potassium: 3.4 mmol/L — ABNORMAL LOW (ref 3.5–5.1)
Sodium: 126 mmol/L — ABNORMAL LOW (ref 135–145)
Total Bilirubin: 0.7 mg/dL (ref 0.3–1.2)
Total Protein: 7.9 g/dL (ref 6.5–8.1)

## 2021-08-01 LAB — RESP PANEL BY RT-PCR (FLU A&B, COVID) ARPGX2
Influenza A by PCR: NEGATIVE
Influenza B by PCR: NEGATIVE
SARS Coronavirus 2 by RT PCR: NEGATIVE

## 2021-08-01 LAB — MAGNESIUM: Magnesium: 2 mg/dL (ref 1.7–2.4)

## 2021-08-01 MED ORDER — HEPARIN SODIUM (PORCINE) 5000 UNIT/ML IJ SOLN
5000.0000 [IU] | Freq: Three times a day (TID) | INTRAMUSCULAR | Status: DC
  Administered 2021-08-02: 5000 [IU] via SUBCUTANEOUS
  Filled 2021-08-01 (×3): qty 1

## 2021-08-01 MED ORDER — ACETAMINOPHEN 325 MG PO TABS
650.0000 mg | ORAL_TABLET | Freq: Four times a day (QID) | ORAL | Status: DC | PRN
  Administered 2021-08-05 – 2021-08-06 (×4): 650 mg via ORAL
  Filled 2021-08-01 (×4): qty 2

## 2021-08-01 MED ORDER — POTASSIUM CHLORIDE 20 MEQ PO PACK
40.0000 meq | PACK | Freq: Once | ORAL | Status: AC
  Administered 2021-08-01: 40 meq via ORAL
  Filled 2021-08-01: qty 2

## 2021-08-01 MED ORDER — SODIUM CHLORIDE 0.9 % IV SOLN: INTRAVENOUS | Status: AC

## 2021-08-01 MED ORDER — ACETAMINOPHEN 650 MG RE SUPP: 650.0000 mg | Freq: Four times a day (QID) | RECTAL | Status: DC | PRN

## 2021-08-01 MED ORDER — IPRATROPIUM-ALBUTEROL 0.5-2.5 (3) MG/3ML IN SOLN
3.0000 mL | Freq: Once | RESPIRATORY_TRACT | Status: AC
  Administered 2021-08-01: 3 mL via RESPIRATORY_TRACT
  Filled 2021-08-01: qty 3

## 2021-08-01 MED ORDER — SODIUM CHLORIDE 0.9 % IV BOLUS
500.0000 mL | Freq: Once | INTRAVENOUS | Status: AC
  Administered 2021-08-01: 500 mL via INTRAVENOUS

## 2021-08-01 MED ORDER — GUAIFENESIN-DM 100-10 MG/5ML PO SYRP
10.0000 mL | ORAL_SOLUTION | Freq: Three times a day (TID) | ORAL | Status: AC
  Administered 2021-08-01 – 2021-08-02 (×4): 10 mL via ORAL
  Filled 2021-08-01 (×4): qty 10

## 2021-08-01 MED ORDER — LEVOTHYROXINE SODIUM 75 MCG PO TABS
75.0000 ug | ORAL_TABLET | Freq: Every day | ORAL | Status: DC
Start: 2021-08-02 — End: 2021-08-08
  Administered 2021-08-02 – 2021-08-08 (×7): 75 ug via ORAL
  Filled 2021-08-01 (×7): qty 1

## 2021-08-01 MED ORDER — FLECAINIDE ACETATE 50 MG PO TABS
50.0000 mg | ORAL_TABLET | Freq: Two times a day (BID) | ORAL | Status: DC
  Administered 2021-08-01 – 2021-08-08 (×14): 50 mg via ORAL
  Filled 2021-08-01 (×14): qty 1

## 2021-08-01 MED ORDER — LETROZOLE 2.5 MG PO TABS
2.5000 mg | ORAL_TABLET | Freq: Every day | ORAL | Status: DC
  Administered 2021-08-03 – 2021-08-08 (×6): 2.5 mg via ORAL
  Filled 2021-08-01 (×8): qty 1

## 2021-08-01 MED ORDER — HYDRALAZINE HCL 25 MG PO TABS
100.0000 mg | ORAL_TABLET | Freq: Two times a day (BID) | ORAL | Status: DC
  Administered 2021-08-01 – 2021-08-08 (×14): 100 mg via ORAL
  Filled 2021-08-01 (×4): qty 2
  Filled 2021-08-01 (×2): qty 4
  Filled 2021-08-01: qty 2
  Filled 2021-08-01 (×2): qty 4
  Filled 2021-08-01: qty 2
  Filled 2021-08-01 (×4): qty 4
  Filled 2021-08-01: qty 2
  Filled 2021-08-01 (×5): qty 4
  Filled 2021-08-01: qty 2

## 2021-08-01 MED ORDER — METHYLPREDNISOLONE SODIUM SUCC 125 MG IJ SOLR
125.0000 mg | Freq: Once | INTRAMUSCULAR | Status: AC
  Administered 2021-08-01: 125 mg via INTRAVENOUS
  Filled 2021-08-01: qty 2

## 2021-08-01 MED ORDER — ALBUTEROL SULFATE (2.5 MG/3ML) 0.083% IN NEBU
2.5000 mg | INHALATION_SOLUTION | Freq: Once | RESPIRATORY_TRACT | Status: AC
  Administered 2021-08-01: 2.5 mg via RESPIRATORY_TRACT
  Filled 2021-08-01: qty 3

## 2021-08-01 MED ORDER — FLUTICASONE PROPIONATE 50 MCG/ACT NA SUSP
1.0000 | Freq: Two times a day (BID) | NASAL | Status: DC
  Administered 2021-08-01 – 2021-08-08 (×14): 1 via NASAL
  Filled 2021-08-01 (×3): qty 16

## 2021-08-01 MED ORDER — LISINOPRIL 10 MG PO TABS
20.0000 mg | ORAL_TABLET | Freq: Every day | ORAL | Status: DC
  Administered 2021-08-02 – 2021-08-03 (×2): 20 mg via ORAL
  Filled 2021-08-01 (×2): qty 2

## 2021-08-01 MED ORDER — ATENOLOL 25 MG PO TABS
50.0000 mg | ORAL_TABLET | Freq: Every day | ORAL | Status: DC
  Administered 2021-08-02 – 2021-08-03 (×2): 50 mg via ORAL
  Filled 2021-08-01 (×2): qty 2

## 2021-08-01 MED ORDER — POLYETHYLENE GLYCOL 3350 17 G PO PACK
17.0000 g | PACK | Freq: Every day | ORAL | Status: DC | PRN
  Administered 2021-08-05 – 2021-08-06 (×2): 17 g via ORAL
  Filled 2021-08-01 (×2): qty 1

## 2021-08-01 MED ORDER — IPRATROPIUM-ALBUTEROL 0.5-2.5 (3) MG/3ML IN SOLN
3.0000 mL | Freq: Four times a day (QID) | RESPIRATORY_TRACT | Status: AC
  Administered 2021-08-01 – 2021-08-02 (×4): 3 mL via RESPIRATORY_TRACT
  Filled 2021-08-01 (×3): qty 3

## 2021-08-01 MED ORDER — METHYLPREDNISOLONE SODIUM SUCC 125 MG IJ SOLR
60.0000 mg | Freq: Two times a day (BID) | INTRAMUSCULAR | Status: DC
  Administered 2021-08-01 – 2021-08-06 (×10): 60 mg via INTRAVENOUS
  Filled 2021-08-01 (×10): qty 2

## 2021-08-01 MED ORDER — ALBUTEROL SULFATE (2.5 MG/3ML) 0.083% IN NEBU
2.5000 mg | INHALATION_SOLUTION | RESPIRATORY_TRACT | Status: DC | PRN
  Administered 2021-08-02 – 2021-08-06 (×5): 2.5 mg via RESPIRATORY_TRACT
  Filled 2021-08-01 (×6): qty 3

## 2021-08-01 NOTE — ED Triage Notes (Signed)
Sent for PCP for treatment of pneumonia, c/o shortness of breath

## 2021-08-01 NOTE — ED Notes (Signed)
Sprite given to the patient.

## 2021-08-01 NOTE — Progress Notes (Signed)
° °  Subjective:    Patient ID: [name], female    DOB: [birth year], 87 y.o.   MRN: [id number]   Chief Complaint: No chief complaint on file.   HPI Patient did telephone visit  yesterday with Dr. Lajuana Ripple. She was dx with possible influenza and ws given tessalon perles and astelin. Patient states that she feels terrible. Gets sob when walking.    Review of Systems  Constitutional:  Negative for diaphoresis.  Eyes:  Negative for pain.  Respiratory:  Positive for cough and wheezing. Negative for shortness of breath.   Cardiovascular:  Negative for chest pain, palpitations and leg swelling.  Gastrointestinal:  Negative for abdominal pain.  Endocrine: Negative for polydipsia.  Skin:  Negative for rash.  Neurological:  Negative for dizziness, weakness and headaches.  Hematological:  Does not bruise/bleed easily.  All other systems reviewed and are negative.     Objective:   Physical Exam Constitutional:      Appearance: She is well-developed.  Pulmonary:     Effort: Pulmonary effort is normal.     Breath sounds: Examination of the right-upper field reveals wheezing and rhonchi. Examination of the left-upper field reveals wheezing and rhonchi. Examination of the right-middle field reveals wheezing and rhonchi. Examination of the left-middle field reveals wheezing and rhonchi. Examination of the right-lower field reveals wheezing and rhonchi. Examination of the left-lower field reveals wheezing and rhonchi. Wheezing and rhonchi present.  Musculoskeletal:        General: Normal range of motion.  Skin:    General: Skin is warm.  Neurological:     General: No focal deficit present.     Mental Status: She is alert.   BP (!) 190/68    Pulse 64    Temp 98.1 F (36.7 C) (Temporal)    Resp 20    Ht 5\' 5"  (1.651 m)    Wt 109 lb (49.4 kg)    SpO2 90%    BMI 18.14 kg/m   Oxygen saturation upon arrival to eam room was 88% took 5 minutes to recover to 92%  Chest xray- right  inflitrate-Preliminary reading by Ronnald Collum, FNP  [facility], [place]       Assessment & Plan:  [name] in today with chief complaint of Shortness of Breath (Not eating/)   1. Chest congestion - DG Chest 2 View  2. SOB (shortness of breath) 3. Pneumonia of right lung due to infectious organism, unspecified part of lung Daughter will take her to hospital Go straight to hospital    The above assessment and management plan was discussed with the patient. The patient verbalized understanding of and has agreed to the management plan. Patient is aware to call the clinic if symptoms persist or worsen. Patient is aware when to return to the clinic for a follow-up visit. Patient educated on when it is appropriate to go to the emergency department.   Mary-Margaret Hassell Done, FNP

## 2021-08-01 NOTE — ED Provider Notes (Signed)
Care transferred to me.  Patient still has some diffuse wheezing on my exam consistent with bronchitis.  She desaturated into the low 80s with walking and then getting back into the bed.  Not stable for discharge.  Will admit to the hospitalist service and give another round of nebulizer.   Sherwood Gambler, MD 08/01/21 [phone]

## 2021-08-01 NOTE — ED Notes (Signed)
Respiratory contacted for nebulizer treatment. ° °

## 2021-08-01 NOTE — H&P (Addendum)
History and Physical    ANGELAMARIE AVAKIAN TIR:[id number] DOB: 07-Jun-1934 DOA: 08/01/2021  PCP: [name], DO   Patient coming from: Home  I have personally briefly reviewed patient's old medical records in [place]  Chief Complaint: Difficulty breathing.  HPI: [name] is a 87 y.o. female with medical history significant for CKD 3, hypertension, diastolic CHF, atrial fibrillation.  Patient presented to the ED with complaints of generalized weakness for 2 days, dry cough and difficulty breathing.  She reports nasal congestion also.  Reports poor oral intake.  No chest pain no leg swelling.  No fevers no chills.  No vomiting no loose stools. Went to her primary care provider's office and was referred to the ED. Patient has never smoked cigarettes.  She reports a history of bronchitis, but no COPD or asthma diagnosis ( Though asthma is listed on problem list).  ED Course: Temperature 98.1.  Heart rate 60s to 71.  Respiratory rate 16 - 24.  Blood pressure systolic initially in 676, currently 142/50.  O2 sats on room air 93 to 97%, but with ambulation O2 sats dropped to 83%.  She is currently on 1 L at rest. Chest x-ray without acute abnormality.  Bronchodilators given in ED, but with persistent wheezing and hypoxia with ambulation hospitalist to admit.  Review of Systems: As per HPI all other systems reviewed and negative.  Past Medical History:  Diagnosis Date   Anemia    Arthritis    Asthma    Atrial fibrillation ([place])    Basal cell carcinoma 02/06/2009   Left ear targus- ([facility])   Basal cell carcinoma 09/28/2008   Right back-([id number])   CKD (chronic kidney disease), stage III (HCC)    Heart murmur    Hyperlipidemia    Hypertension    Hypothyroidism    Nodule of right lung    Right upper lobe   PONV (postoperative nausea and vomiting)    Renal insufficiency    Chronic   Renal vascular disease    Right renal artery stenosis ([place]) 05/09/2015   Squamous cell  carcinoma of skin 09/07/2019   KA-Left shin-txpbx   Vertigo     Past Surgical History:  Procedure Laterality Date   BIOPSY  04/09/2021   Procedure: BIOPSY;  Surgeon: Harvel Quale, MD;  [facility];  Service: Gastroenterology;;   BREAST LUMPECTOMY WITH RADIOACTIVE SEED LOCALIZATION Right 04/08/2018   Procedure: BREAST LUMPECTOMY WITH RADIOACTIVE SEED LOCALIZATION;  Surgeon: Rolm Bookbinder, MD;  Location: [place];  Service: General;  Laterality: Right;   COLONOSCOPY  2016   diverticulosis, hemorrhoids   ESOPHAGOGASTRODUODENOSCOPY  2016   normal   ESOPHAGOGASTRODUODENOSCOPY (EGD) WITH PROPOFOL N/A 04/09/2021   Procedure: ESOPHAGOGASTRODUODENOSCOPY (EGD) WITH PROPOFOL;  Surgeon: Harvel Quale, MD;  [facility];  Service: Gastroenterology;  Laterality: N/A;  12:45   IR GENERIC HISTORICAL  08/29/2016   IR US GUIDE VASC ACCESS RIGHT 08/29/2016 [facility]   IR GENERIC HISTORICAL  08/29/2016   IR RENAL BILAT S&I MOD SED 08/29/2016 [facility]   KNEE ARTHROSCOPY Right    2019   THYROIDECTOMY, PARTIAL Right 1981   middle lobe removed 1st, then right lobe removed 7-8 years later (approx. 1988)     reports that she has never smoked. She has never used smokeless tobacco. She reports that she does not drink alcohol and does not use drugs.  Allergies  Allergen Reactions   Amlodipine Other (See Comments)  edema   Xeroform Occlusive Gauze Strip [Bismuth Tribromoph-Petrolatum]    Isosorbide Nitrate Nausea Only   Sulfonamide Derivatives Nausea Only and Rash   Terazosin Hives and Nausea Only    Family History  Problem Relation Age of Onset   Thyroid disease Mother    Stroke Father    Hypertension Brother    Lung cancer Maternal Uncle    Cancer Maternal Grandmother        Liver   Lung cancer Maternal Uncle    Hypertension Daughter    Hypercholesterolemia Daughter    Hypercholesterolemia Daughter     Prior to Admission medications    Medication Sig Start Date End Date Taking? Authorizing Provider  atenolol-chlorthalidone (TENORETIC) 50-25 MG tablet TAKE 1 TABLET BY MOUTH EVERY DAY 07/08/21  Yes Gottschalk, Ashly M, DO  atorvastatin (LIPITOR) 20 MG tablet TAKE 1/2 TABLET BY MOUTH EVERY DAY 07/08/21  Yes Gottschalk, Ashly M, DO  azelastine (ASTELIN) 0.1 % nasal spray Place 1 spray into both nostrils 2 (two) times daily. 07/31/21  Yes Gottschalk, Ashly M, DO  benzonatate (TESSALON) 200 MG capsule Take 1 capsule (200 mg total) by mouth 2 (two) times daily as needed for cough. 07/31/21  Yes Gottschalk, Leatrice Jewels M, DO  calcium-vitamin D (OSCAL WITH D) 500-200 MG-UNIT per tablet Take 2 tablets by mouth at bedtime.   Yes [provider]  cholecalciferol (VITAMIN D) 1000 units tablet Take 1,000 Units by mouth daily.   Yes [provider]  diclofenac Sodium (VOLTAREN) 1 % GEL Apply 2 g topically 4 (four) times daily. 02/22/21  Yes Ivy Lynn, NP  ferrous sulfate 325 (65 FE) MG tablet Take 325 mg by mouth 2 (two) times daily with a meal.   Yes [provider]  flecainide (TAMBOCOR) 50 MG tablet Take 1 tablet (50 mg total) by mouth 2 (two) times daily. PATIENT NEEDS TO KEEP SCHEDULED APPT FOR FUTURE REFILLS. 06/17/21  Yes Minus Breeding, MD  fluticasone (FLONASE) 50 MCG/ACT nasal spray Place 1 spray into both nostrils 2 (two) times daily. 07/19/20  Yes Dettinger, Fransisca Kaufmann, MD  hydrALAZINE (APRESOLINE) 100 MG tablet Take 100 mg by mouth 2 (two) times daily.   Yes [provider]  hydroxypropyl methylcellulose / hypromellose (ISOPTO TEARS / GONIOVISC) 2.5 % ophthalmic solution Place 1 drop into both eyes daily.   Yes [provider]  letrozole (FEMARA) 2.5 MG tablet TAKE 1 TABLET BY MOUTH EVERY DAY 10/23/20  Yes Derek Jack, MD  levothyroxine (SYNTHROID) 75 MCG tablet Take 1 tablet (75 mcg total) by mouth daily. 10/18/20  Yes Gottschalk, Leatrice Jewels M, DO  lisinopril (ZESTRIL) 20 MG tablet Take 20  mg by mouth in the morning and at bedtime. Per Dr Posey Pronto 01/2021   Yes [provider]  mirtazapine (REMERON) 7.5 MG tablet Take 1 tablet (7.5 mg total) by mouth at bedtime. 07/25/21  Yes Harvel Quale, MD  ondansetron (ZOFRAN ODT) 4 MG disintegrating tablet Take 1 tablet (4 mg total) by mouth every 8 (eight) hours as needed. 04/08/21  Yes Carlan, Chelsea L, NP  Polyethylene Glycol 3350 (MIRALAX PO) Take by mouth. Takes one capful daily   Yes [provider]  acetaminophen (TYLENOL) 325 MG tablet Take 325 mg by mouth daily as needed for moderate pain or headache.    [provider]    Physical Exam: Vitals:   08/01/21 1714 08/01/21 1730 08/01/21 1800 08/01/21 1830  BP:  (!) 174/66 (!) 141/51 (!) 142/50  Pulse:  71  60 60  Resp:  20 16 19   Temp:      TempSrc:      SpO2: 96% 98% 97% 98%    Constitutional: NAD, calm, comfortable Vitals:   08/01/21 1714 08/01/21 1730 08/01/21 1800 08/01/21 1830  BP:  (!) 174/66 (!) 141/51 (!) 142/50  Pulse:  71 60 60  Resp:  20 16 19   Temp:      TempSrc:      SpO2: 96% 98% 97% 98%   Eyes: PERRL, lids and conjunctivae normal ENMT: Mucous membranes are  very dry  Neck: normal, supple, no masses, no thyromegaly Respiratory: Diffuse expiratory rhonchi, sounds congested.  Normal respiratory effort. No accessory muscle use.  Cardiovascular: Regular rate and rhythm, no murmurs / rubs / gallops. No extremity edema.  Lower extremities warm and well-perfused Abdomen: no tenderness, no masses palpated. No hepatosplenomegaly. Bowel sounds positive.  Musculoskeletal: no clubbing / cyanosis. No joint deformity upper and lower extremities. Skin: no rashes, lesions, ulcers. No induration Neurologic: No apparent cranial nerve abnormality, moving extremities spontaneously. Psychiatric: Normal judgment and insight. Alert and oriented x 3. Normal mood.   Labs on Admission: I have personally reviewed following labs and imaging  studies  CBC: Recent Labs  Lab 08/01/21 1444  WBC 4.6  NEUTROABS 3.9  HGB 9.2*  HCT 27.5*  MCV 87.6  PLT 449*   Basic Metabolic Panel: Recent Labs  Lab 08/01/21 1444  NA 126*  K 3.4*  CL 88*  CO2 27  GLUCOSE 128*  BUN 30*  CREATININE 1.59*  CALCIUM 8.7*   GFR: Estimated Creatinine Clearance: 20.2 mL/min (A) (by C-G formula based on SCr of 1.59 mg/dL (H)). Liver Function Tests: Recent Labs  Lab 08/01/21 1444  AST 26  ALT 16  ALKPHOS 59  BILITOT 0.7  PROT 7.9  ALBUMIN 3.8    Radiological Exams on Admission: DG Chest 2 View  Result Date: 08/01/2021 CLINICAL DATA:  Chest congestion. EXAM: CHEST - 2 VIEW COMPARISON:  March 10, 2021. FINDINGS: Stable cardiomegaly. Both lungs are clear. The visualized skeletal structures are unremarkable. IMPRESSION: No active cardiopulmonary disease. Electronically Signed   By: Marijo Conception M.D.   On: 08/01/2021 14:15    EKG: Independently reviewed.   Assessment/Plan Principal Problem:   Acute respiratory failure with hypoxia (HCC) Active Problems:   Acute bronchitis   Essential hypertension   Paroxysmal atrial fibrillation (HCC)   CKD (chronic kidney disease) stage 3, GFR 30-59 ml/min (HCC)   Chronic diastolic heart failure (HCC)   Hyponatremia   Acute respiratory failure with hypoxia secondary to bronchitis-rest O2 sats greater than 90%, but with ambulation O2 sats dropped to 83% on room air.  She is currently on 1 L.  Lung exam with diffuse rhonchi.  Likely other viral infection.  Two-view chest x-ray unremarkable.  COVID and influenza test negative.  She appears dehydrated.  Afebrile without leukocytosis. -Consider repeat chest imaging in a.m. pending clinical course -Albuterol as needed, DuoNebs scheduled  -125 mg Solu-Medrol given, continue 60 Q12h -Mucolytic's -Supplemental oxygen -Hold off on antibiotics at this time  Electrolyte abnormalities hyponatremia sodium 126, potassium 3.4.  Likely from poor oral intake,  and chlorthalidone. - 500 bolus given continue N/s 75cc/hr x 20 hrs  Chronic anemia- Hgb 9.2, baseline 9-11.  Follows with Dr. Delton Coombes. 2/2 CKD and relative iron deficiency. On iron supplements.  Hypertension-initially elevated now improved.  -Resume atenolol, lisinopril, hydralazine -Hold chlorthalidone while hydrating  History of left atrial tachycardia/paroxysmal atrial fibrillation-  Not on anticoagulation.  Followed with Dr. Percival Spanish, last note on file from 2020. -Resume flecainide, atenolol  CKD 3b-creatinine 1.59.  About baseline.  Chronic diastolic CHF-stable and compensated.  Appears dehydrated today.  Echo 2019 EF 60 to 65% with grade 2 diastolic dysfunction, mild aortic stenosis.  Stage I breast cancer follows with Dr. Delton Coombes -Resume letrozole   DVT prophylaxis: Heparin Code Status: DNR confirmed with patient at bedside. Family Communication: None at bedside.  Daughter Sheppard Penton is HCPOA.  Disposition Plan: ~ 1-2 days Consults called: None Admission status:  Obs tele  I certify that at the point of admission it is my clinical judgment that the patient will require inpatient hospital care spanning beyond 2 midnights from the point of admission due to high intensity of service, high risk for further deterioration and high frequency of surveillance required.    Bethena Roys MD [facility]  08/01/2021, 7:23 PM

## 2021-08-01 NOTE — ED Provider Notes (Signed)
[facility] EMERGENCY DEPARTMENT Provider Note   CSN: [id number] Arrival date & time: 08/01/21  1337     History Chief Complaint  Patient presents with   Pneumonia    [name] is a 87 y.o. female.  Patient presented to her doctor's office with cough and weakness for couple days.  They told her to come to the emergency department to determine if she had a pneumonia.  The history is provided by the patient and medical records. No language interpreter was used.  Cough Cough characteristics:  Non-productive Sputum characteristics:  Nondescript Severity:  Moderate Onset quality:  Sudden Duration:  1 day Timing:  Constant Chronicity:  Recurrent Smoker: no   Context: not animal exposure   Relieved by:  Nothing Associated symptoms: wheezing   Associated symptoms: no chest pain, no eye discharge, no headaches and no rash       Past Medical History:  Diagnosis Date   Anemia    Arthritis    Asthma    Atrial fibrillation ([place])    Basal cell carcinoma 02/06/2009   Left ear targus- ([facility])   Basal cell carcinoma 09/28/2008   Right back-(CX35FU)   CKD (chronic kidney disease), stage III ([place])    Heart murmur    Hyperlipidemia    Hypertension    Hypothyroidism    Nodule of right lung    Right upper lobe   PONV (postoperative nausea and vomiting)    Renal insufficiency    Chronic   Renal vascular disease    Right renal artery stenosis ([place]) 05/09/2015   Squamous cell carcinoma of skin 09/07/2019   KA-Left shin-txpbx   Vertigo     Patient Active Problem List   Diagnosis Date Noted   Decreased appetite 07/25/2021   Protein-calorie malnutrition ([place]) 07/25/2021   Skin tear of right lower leg without complication 82/95/6213   Non-intractable vomiting 04/08/2021   Edema 02/22/2021   Constipation 01/23/2021   Osteoarthritis of knee 12/13/2020   History of arthroscopy of knee 12/13/2020   Pain in joint of left knee 12/13/2020   Atrial tachycardia ([place]) 07/26/2019    Aortic valve sclerosis 07/26/2019   Aortic valve stenosis 12/01/2018   Breast cancer, right ([place]) 04/08/2018   Malignant neoplasm of lower-outer quadrant of right breast of female, estrogen receptor positive ([place]) 02/25/2018   Tear of lateral meniscus of knee 10/27/2017   Vertigo 09/09/2016   Renal vascular disease 10/31/2015   Nodule of right lung 10/31/2015   Asthma without status asthmaticus 10/31/2015   Essential hypertension, malignant    Hypothyroidism 10/15/2015   Pancytopenia ([place]) 10/15/2015   Thrombocytopenia ([place]) 06/16/2015   Right renal artery stenosis ([place]) 05/09/2015   Chronic diastolic heart failure ([place]) 04/03/2014   CKD (chronic kidney disease) stage 3, GFR 30-59 ml/min (HCC) 04/02/2014   Paroxysmal atrial fibrillation ([place]) 01/25/2014   Anemia 07/19/2013   Bilateral lower extremity edema 06/30/2013   Hypertension with fluid overload 06/30/2013   Varicose veins of lower extremities with other complications 08/65/7846   ABNORMAL STRESS ELECTROCARDIOGRAM 01/23/2010   Hyperlipidemia 12/18/2009   Essential hypertension 12/18/2009   RENAL INSUFFICIENCY, CHRONIC 12/18/2009   Arthropathy 12/18/2009    Past Surgical History:  Procedure Laterality Date   BIOPSY  04/09/2021   Procedure: BIOPSY;  Surgeon: Harvel Quale, MD;  Location: [facility];  Service: Gastroenterology;;   BREAST LUMPECTOMY WITH RADIOACTIVE SEED LOCALIZATION Right 04/08/2018   Procedure: BREAST LUMPECTOMY WITH RADIOACTIVE SEED LOCALIZATION;  Surgeon: Rolm Bookbinder, MD;  Location:  [facility];  Service: General;  Laterality: Right;   COLONOSCOPY  2016   diverticulosis, hemorrhoids   ESOPHAGOGASTRODUODENOSCOPY  2016   normal   ESOPHAGOGASTRODUODENOSCOPY (EGD) WITH PROPOFOL N/A 04/09/2021   Procedure: ESOPHAGOGASTRODUODENOSCOPY (EGD) WITH PROPOFOL;  Surgeon: Harvel Quale, MD;  Location: [facility];  Service: Gastroenterology;  Laterality: N/A;  12:45   IR GENERIC  HISTORICAL  08/29/2016   IR US GUIDE VASC ACCESS RIGHT 08/29/2016 [facility]   IR GENERIC HISTORICAL  08/29/2016   IR RENAL BILAT S&I MOD SED 08/29/2016 [facility]   KNEE ARTHROSCOPY Right    2019   THYROIDECTOMY, PARTIAL Right 1981   middle lobe removed 1st, then right lobe removed 7-8 years later (approx. 1988)     OB History   No obstetric history on file.     Family History  Problem Relation Age of Onset   Thyroid disease Mother    Stroke Father    Hypertension Brother    Lung cancer Maternal Uncle    Cancer Maternal Grandmother        Liver   Lung cancer Maternal Uncle    Hypertension Daughter    Hypercholesterolemia Daughter    Hypercholesterolemia Daughter     Social History   Tobacco Use   Smoking status: Never   Smokeless tobacco: Never  Vaping Use   Vaping Use: Never used  Substance Use Topics   Alcohol use: No   Drug use: No    Home Medications Prior to Admission medications   Medication Sig Start Date End Date Taking? Authorizing Provider  acetaminophen (TYLENOL) 325 MG tablet Take 325 mg by mouth daily as needed for moderate pain or headache.    [provider]  atenolol-chlorthalidone (TENORETIC) 50-25 MG tablet TAKE 1 TABLET BY MOUTH EVERY DAY 07/08/21   Ronnie Doss M, DO  atorvastatin (LIPITOR) 20 MG tablet TAKE 1/2 TABLET BY MOUTH EVERY DAY 07/08/21   Ronnie Doss M, DO  azelastine (ASTELIN) 0.1 % nasal spray Place 1 spray into both nostrils 2 (two) times daily. 07/31/21   [name], DO  benzonatate (TESSALON) 200 MG capsule Take 1 capsule (200 mg total) by mouth 2 (two) times daily as needed for cough. 07/31/21   [name], DO  calcium-vitamin D (OSCAL WITH D) 500-200 MG-UNIT per tablet Take 2 tablets by mouth at bedtime.    [provider]  cholecalciferol (VITAMIN D) 1000 units tablet Take 1,000 Units by mouth daily.    [provider]  diclofenac Sodium (VOLTAREN) 1 % GEL Apply 2 g  topically 4 (four) times daily. 02/22/21   Ivy Lynn, NP  ferrous sulfate 325 (65 FE) MG tablet Take 325 mg by mouth 2 (two) times daily with a meal.    [provider]  flecainide (TAMBOCOR) 50 MG tablet Take 1 tablet (50 mg total) by mouth 2 (two) times daily. PATIENT NEEDS TO KEEP SCHEDULED APPT FOR FUTURE REFILLS. 06/17/21   Minus Breeding, MD  fluticasone (FLONASE) 50 MCG/ACT nasal spray Place 1 spray into both nostrils 2 (two) times daily. 07/19/20   Dettinger, Fransisca Kaufmann, MD  hydrALAZINE (APRESOLINE) 100 MG tablet Take 100 mg by mouth 2 (two) times daily.    [provider]  hydroxypropyl methylcellulose / hypromellose (ISOPTO TEARS / GONIOVISC) 2.5 % ophthalmic solution Place 1 drop into both eyes daily.    [provider]  letrozole (FEMARA) 2.5 MG tablet TAKE 1 TABLET BY MOUTH EVERY DAY 10/23/20  Derek Jack, MD  levothyroxine (SYNTHROID) 75 MCG tablet Take 1 tablet (75 mcg total) by mouth daily. 10/18/20   [name], DO  lisinopril (ZESTRIL) 20 MG tablet Take 20 mg by mouth in the morning and at bedtime. Per Dr Posey Pronto 01/2021    [provider]  mirtazapine (REMERON) 7.5 MG tablet Take 1 tablet (7.5 mg total) by mouth at bedtime. 07/25/21   Harvel Quale, MD  ondansetron (ZOFRAN ODT) 4 MG disintegrating tablet Take 1 tablet (4 mg total) by mouth every 8 (eight) hours as needed. 04/08/21   Carlan, Deatra Robinson, NP  Polyethylene Glycol 3350 (MIRALAX PO) Take by mouth. Takes one capful daily    [provider]    Allergies    Amlodipine, Xeroform occlusive gauze strip [bismuth tribromoph-petrolatum], Isosorbide nitrate, Sulfonamide derivatives, and Terazosin  Review of Systems   Review of Systems  Constitutional:  Negative for appetite change and fatigue.  HENT:  Negative for congestion, ear discharge and sinus pressure.   Eyes:  Negative for discharge.  Respiratory:  Positive for cough and wheezing.   Cardiovascular:   Negative for chest pain.  Gastrointestinal:  Negative for abdominal pain and diarrhea.  Genitourinary:  Negative for frequency and hematuria.  Musculoskeletal:  Negative for back pain.  Skin:  Negative for rash.  Neurological:  Negative for seizures and headaches.  Psychiatric/Behavioral:  Negative for hallucinations.    Physical Exam Updated Vital Signs BP (!) 174/61    Pulse 66    Temp 98.1 F (36.7 C) (Oral)    Resp (!) 22    SpO2 99%   Physical Exam Vitals and nursing note reviewed.  Constitutional:      Appearance: She is well-developed.  HENT:     Head: Normocephalic.     Nose: Nose normal.  Eyes:     General: No scleral icterus.    Conjunctiva/sclera: Conjunctivae normal.  Neck:     Thyroid: No thyromegaly.  Cardiovascular:     Rate and Rhythm: Normal rate and regular rhythm.     Heart sounds: No murmur heard.   No friction rub. No gallop.  Pulmonary:     Breath sounds: No stridor. Wheezing present. No rales.  Chest:     Chest wall: No tenderness.  Abdominal:     General: There is no distension.     Tenderness: There is no abdominal tenderness. There is no rebound.  Musculoskeletal:        General: Normal range of motion.     Cervical back: Neck supple.  Lymphadenopathy:     Cervical: No cervical adenopathy.  Skin:    Findings: No erythema or rash.  Neurological:     Mental Status: She is alert and oriented to person, place, and time.     Motor: No abnormal muscle tone.     Coordination: Coordination normal.  Psychiatric:        Behavior: Behavior normal.    ED Results / Procedures / Treatments   Labs (all labs ordered are listed, but only abnormal results are displayed) Labs Reviewed  CBC WITH DIFFERENTIAL/PLATELET - Abnormal; Notable for the following components:      Result Value   RBC 3.14 (*)    Hemoglobin 9.2 (*)    HCT 27.5 (*)    Platelets 149 (*)    Lymphs Abs 0.3 (*)    All other components within normal limits  COMPREHENSIVE METABOLIC  PANEL - Abnormal; Notable for the following components:   Sodium  126 (*)    Potassium 3.4 (*)    Chloride 88 (*)    Glucose, Bld 128 (*)    BUN 30 (*)    Creatinine, Ser 1.59 (*)    Calcium 8.7 (*)    GFR, Estimated 32 (*)    All other components within normal limits  RESP PANEL BY RT-PCR (FLU A&B, COVID) [id number]    EKG None  Radiology DG Chest 2 View  Result Date: 08/01/2021 CLINICAL DATA:  Chest congestion. EXAM: CHEST - 2 VIEW COMPARISON:  March 10, 2021. FINDINGS: Stable cardiomegaly. Both lungs are clear. The visualized skeletal structures are unremarkable. IMPRESSION: No active cardiopulmonary disease. Electronically Signed   By: Marijo Conception M.D.   On: 08/01/2021 14:15    Procedures Procedures   Medications Ordered in ED Medications  sodium chloride 0.9 % bolus 500 mL (500 mLs Intravenous New Bag/Given 08/01/21 1446)  methylPREDNISolone sodium succinate (SOLU-MEDROL) 125 mg/2 mL injection 125 mg (125 mg Intravenous Given 08/01/21 1446)  ipratropium-albuterol (DUONEB) 0.5-2.5 (3) MG/3ML nebulizer solution 3 mL (3 mLs Nebulization Given 08/01/21 1443)  albuterol (PROVENTIL) (2.5 MG/3ML) 0.083% nebulizer solution 2.5 mg (2.5 mg Nebulization Given 08/01/21 1443)    ED Course  I have reviewed the triage vital signs and the nursing notes.  Pertinent labs & imaging results that were available during my care of the patient were reviewed by me and considered in my medical decision making (see chart for details).  Patient with cough and wheezing.  Chest x-ray does not show pneumonia.  Chemistries show mild hyponatremia and mild dehydration.  Flu test pending. MDM Rules/Calculators/A&P                         Patient with respiratory infection.  Disposition will be done by my colleague    Final Clinical Impression(s) / ED Diagnoses Final diagnoses:  None    Rx / DC Orders ED Discharge Orders     None        Milton Ferguson, MD 08/06/21 1126

## 2021-08-02 DIAGNOSIS — Z681 Body mass index (BMI) 19 or less, adult: Secondary | ICD-10-CM | POA: Diagnosis not present

## 2021-08-02 DIAGNOSIS — Z7401 Bed confinement status: Secondary | ICD-10-CM | POA: Diagnosis not present

## 2021-08-02 DIAGNOSIS — Z66 Do not resuscitate: Secondary | ICD-10-CM | POA: Diagnosis present

## 2021-08-02 DIAGNOSIS — J209 Acute bronchitis, unspecified: Secondary | ICD-10-CM | POA: Diagnosis not present

## 2021-08-02 DIAGNOSIS — I480 Paroxysmal atrial fibrillation: Secondary | ICD-10-CM | POA: Diagnosis present

## 2021-08-02 DIAGNOSIS — J9601 Acute respiratory failure with hypoxia: Secondary | ICD-10-CM | POA: Diagnosis present

## 2021-08-02 DIAGNOSIS — I517 Cardiomegaly: Secondary | ICD-10-CM | POA: Diagnosis not present

## 2021-08-02 DIAGNOSIS — R059 Cough, unspecified: Secondary | ICD-10-CM | POA: Diagnosis not present

## 2021-08-02 DIAGNOSIS — D631 Anemia in chronic kidney disease: Secondary | ICD-10-CM | POA: Diagnosis present

## 2021-08-02 DIAGNOSIS — Z853 Personal history of malignant neoplasm of breast: Secondary | ICD-10-CM | POA: Diagnosis not present

## 2021-08-02 DIAGNOSIS — N1831 Chronic kidney disease, stage 3a: Secondary | ICD-10-CM | POA: Diagnosis not present

## 2021-08-02 DIAGNOSIS — I130 Hypertensive heart and chronic kidney disease with heart failure and stage 1 through stage 4 chronic kidney disease, or unspecified chronic kidney disease: Secondary | ICD-10-CM | POA: Diagnosis present

## 2021-08-02 DIAGNOSIS — J45909 Unspecified asthma, uncomplicated: Secondary | ICD-10-CM | POA: Diagnosis present

## 2021-08-02 DIAGNOSIS — I10 Essential (primary) hypertension: Secondary | ICD-10-CM | POA: Diagnosis not present

## 2021-08-02 DIAGNOSIS — E039 Hypothyroidism, unspecified: Secondary | ICD-10-CM | POA: Diagnosis present

## 2021-08-02 DIAGNOSIS — E860 Dehydration: Secondary | ICD-10-CM | POA: Diagnosis present

## 2021-08-02 DIAGNOSIS — Z801 Family history of malignant neoplasm of trachea, bronchus and lung: Secondary | ICD-10-CM | POA: Diagnosis not present

## 2021-08-02 DIAGNOSIS — Z20822 Contact with and (suspected) exposure to covid-19: Secondary | ICD-10-CM | POA: Diagnosis present

## 2021-08-02 DIAGNOSIS — D649 Anemia, unspecified: Secondary | ICD-10-CM | POA: Diagnosis not present

## 2021-08-02 DIAGNOSIS — Z79811 Long term (current) use of aromatase inhibitors: Secondary | ICD-10-CM | POA: Diagnosis not present

## 2021-08-02 DIAGNOSIS — E871 Hypo-osmolality and hyponatremia: Secondary | ICD-10-CM | POA: Diagnosis present

## 2021-08-02 DIAGNOSIS — Z170 Estrogen receptor positive status [ER+]: Secondary | ICD-10-CM | POA: Diagnosis not present

## 2021-08-02 DIAGNOSIS — I5032 Chronic diastolic (congestive) heart failure: Secondary | ICD-10-CM | POA: Diagnosis not present

## 2021-08-02 DIAGNOSIS — I701 Atherosclerosis of renal artery: Secondary | ICD-10-CM | POA: Diagnosis present

## 2021-08-02 DIAGNOSIS — J310 Chronic rhinitis: Secondary | ICD-10-CM | POA: Diagnosis present

## 2021-08-02 DIAGNOSIS — N1832 Chronic kidney disease, stage 3b: Secondary | ICD-10-CM | POA: Diagnosis present

## 2021-08-02 DIAGNOSIS — E611 Iron deficiency: Secondary | ICD-10-CM | POA: Diagnosis present

## 2021-08-02 DIAGNOSIS — E785 Hyperlipidemia, unspecified: Secondary | ICD-10-CM | POA: Diagnosis present

## 2021-08-02 DIAGNOSIS — N179 Acute kidney failure, unspecified: Secondary | ICD-10-CM | POA: Diagnosis present

## 2021-08-02 DIAGNOSIS — E440 Moderate protein-calorie malnutrition: Secondary | ICD-10-CM | POA: Diagnosis present

## 2021-08-02 LAB — BASIC METABOLIC PANEL
Anion gap: 11 (ref 5–15)
BUN: 35 mg/dL — ABNORMAL HIGH (ref 8–23)
CO2: 24 mmol/L (ref 22–32)
Calcium: 8.5 mg/dL — ABNORMAL LOW (ref 8.9–10.3)
Chloride: 95 mmol/L — ABNORMAL LOW (ref 98–111)
Creatinine, Ser: 1.83 mg/dL — ABNORMAL HIGH (ref 0.44–1.00)
GFR, Estimated: 27 mL/min — ABNORMAL LOW (ref >60)
Glucose, Bld: 172 mg/dL — ABNORMAL HIGH (ref 70–99)
Potassium: 4.3 mmol/L (ref 3.5–5.1)
Sodium: 130 mmol/L — ABNORMAL LOW (ref 135–145)

## 2021-08-02 LAB — CBC
HCT: 26.9 % — ABNORMAL LOW (ref 36.0–46.0)
Hemoglobin: 8.5 g/dL — ABNORMAL LOW (ref 12.0–15.0)
MCH: 28 pg (ref 26.0–34.0)
MCHC: 31.6 g/dL (ref 30.0–36.0)
MCV: 88.5 fL (ref 80.0–100.0)
Platelets: 125 10*3/uL — ABNORMAL LOW (ref 150–400)
RBC: 3.04 MIL/uL — ABNORMAL LOW (ref 3.87–5.11)
RDW: 14.7 % (ref 11.5–15.5)
WBC: 3 10*3/uL — ABNORMAL LOW (ref 4.0–10.5)
nRBC: 0 % (ref 0.0–0.2)

## 2021-08-02 LAB — PROCALCITONIN: Procalcitonin: <0.1 ng/mL

## 2021-08-02 MED ORDER — BUDESONIDE 0.25 MG/2ML IN SUSP
0.2500 mg | Freq: Two times a day (BID) | RESPIRATORY_TRACT | Status: DC
  Administered 2021-08-02 – 2021-08-05 (×7): 0.25 mg via RESPIRATORY_TRACT
  Filled 2021-08-02 (×7): qty 2

## 2021-08-02 MED ORDER — ENSURE ENLIVE PO LIQD
237.0000 mL | Freq: Two times a day (BID) | ORAL | Status: DC
  Administered 2021-08-03 – 2021-08-08 (×10): 237 mL via ORAL

## 2021-08-02 MED ORDER — ACETYLCYSTEINE 20 % IN SOLN
2.0000 mL | Freq: Two times a day (BID) | RESPIRATORY_TRACT | Status: DC
  Administered 2021-08-02 – 2021-08-03 (×3): 2 mL via RESPIRATORY_TRACT
  Filled 2021-08-02 (×3): qty 4

## 2021-08-02 MED ORDER — ADULT MULTIVITAMIN W/MINERALS CH
1.0000 | ORAL_TABLET | Freq: Every day | ORAL | Status: DC
  Administered 2021-08-02 – 2021-08-08 (×7): 1 via ORAL
  Filled 2021-08-02 (×7): qty 1

## 2021-08-02 NOTE — Progress Notes (Signed)
Patient educated on SCDs. Expressed understanding.

## 2021-08-02 NOTE — Progress Notes (Signed)
Patient up and walked down the hall with Jarrett Soho, CNA

## 2021-08-02 NOTE — Progress Notes (Signed)
PROGRESS NOTE    [name]  YNW:[id number] DOB: 1934/04/27 DOA: 08/01/2021 PCP: [name], DO   Brief Narrative:   [name] is a 87 y.o. female with medical history significant for CKD 3, hypertension, diastolic CHF, atrial fibrillation. Patient presented to the ED with complaints of generalized weakness for 2 days, dry cough and difficulty breathing.  She was admitted with acute hypoxemic respiratory failure secondary to bronchitis and continues to have a significant cough with congestion.  She has been started on IV steroids and breathing treatments.  Assessment & Plan:   Principal Problem:   Acute respiratory failure with hypoxia (HCC) Active Problems:   Essential hypertension   Paroxysmal atrial fibrillation (HCC)   CKD (chronic kidney disease) stage 3, GFR 30-59 ml/min (HCC)   Chronic diastolic heart failure (HCC)   Hyponatremia   Acute bronchitis   Acute hypoxemic respiratory failure secondary to bronchitis -Procalcitonin low and COVID and influenza test negative -Continue Solu-Medrol as well as breathing treatments as prescribed -Add Pulmicort and Mucomyst -Wean oxygen as tolerated  Hyponatremia -Currently improving and likely from poor oral intake as well as use of chlorthalidone -Continue IV normal saline and monitor in a.m.  Chronic anemia -Secondary to CKD and relative iron deficiency -Follows with Dr. Delton Coombes, continue monitor  Hypertension -Currently stable, maintain on home medications and avoid chlorthalidone while hydrating  History of left atrial tachycardia/PAF -Flecainide and atenolol -Followed by Dr. Percival Spanish and is not on anticoagulation  CKD stage IIIb -Fluctuating creatinine levels, continue to monitor and repeat BMP  Chronic diastolic CHF -Continue gentle fluid hydration -Currently stable and compensated -Echo 2019 with EF 60-65% and grade 2 diastolic dysfunction  Stage I breast cancer -Follows with Dr.  Delton Coombes -Resume letrozole  DVT prophylaxis: Heparin Code Status: DNR Family Communication: Discussed with daughter on phone 12/16 Disposition Plan:  Status is: Observation  The patient will require care spanning > 2 midnights and should be moved to inpatient because: Continues to require IV medications.   Consultants:  None  Procedures:  See below  Antimicrobials:  Anti-infectives (From admission, onward)    None       Subjective: Patient seen and evaluated today with ongoing cough and congestion.  Currently on 1 L nasal cannula.  Objective: Vitals:   08/01/21 2200 08/02/21 0057 08/02/21 0449 08/02/21 0751  BP:  (!) 128/46 (!) 138/57 (!) 134/50  Pulse:  (!) 56 70 (!) 55  Resp:  17 16 18   Temp:  97.6 F (36.4 C) 98.2 F (36.8 C) 98.1 F (36.7 C)  TempSrc:  Oral Oral Oral  SpO2:  97% 97% 98%  Weight: 45.5 kg     Height: 5\' 5"  (1.651 m)       Intake/Output Summary (Last 24 hours) at 08/02/2021 6578 Last data filed at 08/02/2021 0300 Gross per 24 hour  Intake 1352.75 ml  Output --  Net 1352.75 ml   Filed Weights   08/01/21 2200  Weight: 45.5 kg    Examination:  General exam: Appears calm and comfortable  Respiratory system: Bilateral wheezing with congestion, 1 L nasal cannula Cardiovascular system: S1 & S2 heard, RRR.  Gastrointestinal system: Abdomen is soft Central nervous system: Alert and awake Extremities: No edema Skin: No significant lesions noted Psychiatry: Flat affect.    Data Reviewed: I have personally reviewed following labs and imaging studies  CBC: Recent Labs  Lab 08/01/21 1444 08/02/21 0525  WBC 4.6 3.0*  NEUTROABS 3.9  --   HGB  9.2* 8.5*  HCT 27.5* 26.9*  MCV 87.6 88.5  PLT 149* 937*   Basic Metabolic Panel: Recent Labs  Lab 08/01/21 1444 08/02/21 0525  NA 126* 130*  K 3.4* 4.3  CL 88* 95*  CO2 27 24  GLUCOSE 128* 172*  BUN 30* 35*  CREATININE 1.59* 1.83*  CALCIUM 8.7* 8.5*  MG 2.0  --     GFR: Estimated Creatinine Clearance: 16.1 mL/min (A) (by C-G formula based on SCr of 1.83 mg/dL (H)). Liver Function Tests: Recent Labs  Lab 08/01/21 1444  AST 26  ALT 16  ALKPHOS 59  BILITOT 0.7  PROT 7.9  ALBUMIN 3.8   No results for input(s): LIPASE, AMYLASE in the last 168 hours. No results for input(s): AMMONIA in the last 168 hours. Coagulation Profile: No results for input(s): INR, PROTIME in the last 168 hours. Cardiac Enzymes: No results for input(s): CKTOTAL, CKMB, CKMBINDEX, TROPONINI in the last 168 hours. BNP (last 3 results) No results for input(s): PROBNP in the last 8760 hours. HbA1C: No results for input(s): HGBA1C in the last 72 hours. CBG: No results for input(s): GLUCAP in the last 168 hours. Lipid Profile: No results for input(s): CHOL, HDL, LDLCALC, TRIG, CHOLHDL, LDLDIRECT in the last 72 hours. Thyroid Function Tests: No results for input(s): TSH, T4TOTAL, FREET4, T3FREE, THYROIDAB in the last 72 hours. Anemia Panel: No results for input(s): VITAMINB12, FOLATE, FERRITIN, TIBC, IRON, RETICCTPCT in the last 72 hours. Sepsis Labs: Recent Labs  Lab 08/02/21 0525  PROCALCITON <0.10    Recent Results (from the past 240 hour(s))  Resp Panel by RT-PCR (Flu A&B, Covid) Nasopharyngeal Swab     Status: None   Collection Time: 08/01/21  2:44 PM   Specimen: Nasopharyngeal Swab; Nasopharyngeal(NP) swabs in vial transport medium  Result Value Ref Range Status   SARS Coronavirus 2 by RT PCR NEGATIVE NEGATIVE Final    Comment: (NOTE) SARS-CoV-2 target nucleic acids are NOT DETECTED.  The SARS-CoV-2 RNA is generally detectable in upper respiratory specimens during the acute phase of infection. The lowest concentration of SARS-CoV-2 viral copies this assay can detect is 138 copies/mL. A negative result does not preclude SARS-Cov-2 infection and should not be used as the sole basis for treatment or other patient management decisions. A negative result may  occur with  improper specimen collection/handling, submission of specimen other than nasopharyngeal swab, presence of viral mutation(s) within the areas targeted by this assay, and inadequate number of viral copies(<138 copies/mL). A negative result must be combined with clinical observations, patient history, and epidemiological information. The expected result is Negative.  Fact Sheet for Patients:  EntrepreneurPulse.com.au  Fact Sheet for Healthcare Providers:  IncredibleEmployment.be  This test is no t yet approved or cleared by the [place] FDA and  has been authorized for detection and/or diagnosis of SARS-CoV-2 by FDA under an Emergency Use Authorization (EUA). This EUA will remain  in effect (meaning this test can be used) for the duration of the COVID-19 declaration under Section 564(b)(1) of the Act, 21 U.S.C.section 360bbb-3(b)(1), unless the authorization is terminated  or revoked sooner.       Influenza A by PCR NEGATIVE NEGATIVE Final   Influenza B by PCR NEGATIVE NEGATIVE Final    Comment: (NOTE) The Xpert Xpress SARS-CoV-2/FLU/RSV plus assay is intended as an aid in the diagnosis of influenza from Nasopharyngeal swab specimens and should not be used as a sole basis for treatment. Nasal washings and aspirates are unacceptable for Xpert Xpress SARS-CoV-2/FLU/RSV  testing.  Fact Sheet for Patients: EntrepreneurPulse.com.au  Fact Sheet for Healthcare Providers: IncredibleEmployment.be  This test is not yet approved or cleared by the [place] FDA and has been authorized for detection and/or diagnosis of SARS-CoV-2 by FDA under an Emergency Use Authorization (EUA). This EUA will remain in effect (meaning this test can be used) for the duration of the COVID-19 declaration under Section 564(b)(1) of the Act, 21 U.S.C. section 360bbb-3(b)(1), unless the authorization is terminated  or revoked.  Performed at [facility], [address] [place], [place] [address]          Radiology Studies: DG Chest 2 View  Result Date: 08/01/2021 CLINICAL DATA:  Chest congestion. EXAM: CHEST - 2 VIEW COMPARISON:  March 10, 2021. FINDINGS: Stable cardiomegaly. Both lungs are clear. The visualized skeletal structures are unremarkable. IMPRESSION: No active cardiopulmonary disease. Electronically Signed   By: Marijo Conception M.D.   On: 08/01/2021 14:15        Scheduled Meds:  acetylcysteine  4 mL Nebulization BID   atenolol  50 mg Oral Daily   budesonide (PULMICORT) nebulizer solution  0.25 mg Nebulization BID   flecainide  50 mg Oral BID   fluticasone  1 spray Each Nare BID   guaiFENesin-dextromethorphan  10 mL Oral Q8H   heparin  5,000 Units Subcutaneous Q8H   hydrALAZINE  100 mg Oral BID   ipratropium-albuterol  3 mL Nebulization Q6H   letrozole  2.5 mg Oral Daily   levothyroxine  75 mcg Oral Q0600   lisinopril  20 mg Oral Daily   methylPREDNISolone (SOLU-MEDROL) injection  60 mg Intravenous Q12H   Continuous Infusions:  sodium chloride 75 mL/hr at 08/01/21 1849     LOS: 0 days    Time spent: 35 minutes    Tylon Kemmerling Darleen Crocker, DO [facility]  If 7PM-7AM, please contact night-coverage [email or web address] 08/02/2021, 9:05 AM

## 2021-08-02 NOTE — Progress Notes (Addendum)
Initial Nutrition Assessment  DOCUMENTATION CODES:   Non-severe (moderate) malnutrition in context of chronic illness  INTERVENTION:  Recommend liberalize low salt (heart healthy) diet to regular  Ensure Enlive po BID, each supplement provides 350 kcal and 20 grams of protein   NUTRITION DIAGNOSIS:   Moderate Malnutrition related to chronic illness as evidenced by per patient/family report, energy intake < 75% for > or equal to 1 month, mild fat depletion, moderate fat depletion, mild muscle depletion, moderate muscle depletion, BMI 16.69.   GOAL:  Patient will meet greater than or equal to 90% of their needs   MONITOR:  PO intake, Supplement acceptance, Labs, Skin, Weight trends  REASON FOR ASSESSMENT:   Malnutrition Screening Tool    ASSESSMENT: Patient is an underweight 87 yo female with hx of CKD-3, Anemia, CHF, and HTN. Presents with generalized weakness. Hyponatremia.   Patient ate 100% of breakfast this morning. Patient lives at home and her youngest daughter lives with her. She is accustomed to eating 3 meals daily but since November her appetite has not been as good and she says, "I just can't think of anything I want to eat". Denies chewing problem and home diet is regular. She drinks an original Ensure most days. Independent with feeding.   Patient usual body weight reported at around 109 lb. According to bed scale she weighs 45.5 kg (100 lb) currently.   Medications: solumedrol, synthroid.   IVF-NS@ 75 ml/hr  Labs: BMP Latest Ref Rng & Units 08/02/2021 08/01/2021 07/25/2021  Glucose 70 - 99 mg/dL 172(H) 128(H) 117(H)  BUN 8 - 23 mg/dL 35(H) 30(H) 33(H)  Creatinine 0.44 - 1.00 mg/dL 1.83(H) 1.59(H) 1.98(H)  BUN/Creat Ratio 12 - 28 - - -  Sodium 135 - 145 mmol/L 130(L) 126(L) 129(L)  Potassium 3.5 - 5.1 mmol/L 4.3 3.4(L) 4.1  Chloride 98 - 111 mmol/L 95(L) 88(L) 91(L)  CO2 22 - 32 mmol/L 24 27 30   Calcium 8.9 - 10.3 mg/dL 8.5(L) 8.7(L) 9.0      NUTRITION -  FOCUSED PHYSICAL EXAM:  Flowsheet Row Most Recent Value  Orbital Region Mild depletion  Upper Arm Region Moderate depletion  Thoracic and Lumbar Region Mild depletion  Temple Region Moderate depletion  Clavicle Bone Region Mild depletion  Clavicle and Acromion Bone Region Moderate depletion  Dorsal Hand Mild depletion  Edema (RD Assessment) None  Hair Reviewed  Eyes Reviewed  Mouth Reviewed  Skin Reviewed  Nails Reviewed      Diet Order:   Diet Order             Diet Heart Room service appropriate? Yes; Fluid consistency: Thin  Diet effective now                   EDUCATION NEEDS:  Education needs have been addressed  Skin:  Skin Assessment: Reviewed RN Assessment  Last BM:  12/15  Height:   Ht Readings from Last 1 Encounters:  08/01/21 5\' 5"  (1.651 m)    Weight:   Wt Readings from Last 1 Encounters:  08/01/21 45.5 kg    Ideal Body Weight:   57 kg  BMI:  Body mass index is 16.69 kg/m.  Estimated Nutritional Needs:   Kcal:  7588-3254  Protein:  60-65 gr  Fluid:  >1200 ml daily   Colman Cater MS,RD,CSG,LDN Contact: Shea Evans

## 2021-08-03 ENCOUNTER — Encounter (HOSPITAL_COMMUNITY): Payer: Self-pay | Admitting: Internal Medicine

## 2021-08-03 ENCOUNTER — Inpatient Hospital Stay (HOSPITAL_COMMUNITY): Payer: PPO

## 2021-08-03 DIAGNOSIS — E440 Moderate protein-calorie malnutrition: Secondary | ICD-10-CM | POA: Insufficient documentation

## 2021-08-03 LAB — BASIC METABOLIC PANEL
Anion gap: 9 (ref 5–15)
BUN: 43 mg/dL — ABNORMAL HIGH (ref 8–23)
CO2: 25 mmol/L (ref 22–32)
Calcium: 8.3 mg/dL — ABNORMAL LOW (ref 8.9–10.3)
Chloride: 95 mmol/L — ABNORMAL LOW (ref 98–111)
Creatinine, Ser: 1.8 mg/dL — ABNORMAL HIGH (ref 0.44–1.00)
GFR, Estimated: 27 mL/min — ABNORMAL LOW (ref >60)
Glucose, Bld: 168 mg/dL — ABNORMAL HIGH (ref 70–99)
Potassium: 4.3 mmol/L (ref 3.5–5.1)
Sodium: 129 mmol/L — ABNORMAL LOW (ref 135–145)

## 2021-08-03 LAB — CBC
HCT: 25.9 % — ABNORMAL LOW (ref 36.0–46.0)
Hemoglobin: 8.2 g/dL — ABNORMAL LOW (ref 12.0–15.0)
MCH: 28.5 pg (ref 26.0–34.0)
MCHC: 31.7 g/dL (ref 30.0–36.0)
MCV: 89.9 fL (ref 80.0–100.0)
Platelets: 131 10*3/uL — ABNORMAL LOW (ref 150–400)
RBC: 2.88 MIL/uL — ABNORMAL LOW (ref 3.87–5.11)
RDW: 14.9 % (ref 11.5–15.5)
WBC: 10 10*3/uL (ref 4.0–10.5)
nRBC: 0 % (ref 0.0–0.2)

## 2021-08-03 LAB — MAGNESIUM: Magnesium: 1.9 mg/dL (ref 1.7–2.4)

## 2021-08-03 MED ORDER — HYDRALAZINE HCL 20 MG/ML IJ SOLN
10.0000 mg | INTRAMUSCULAR | Status: DC | PRN
  Administered 2021-08-06: 06:00:00 10 mg via INTRAVENOUS
  Filled 2021-08-03: qty 1

## 2021-08-03 MED ORDER — SODIUM CHLORIDE 0.9 % IV SOLN: INTRAVENOUS | Status: AC

## 2021-08-03 MED ORDER — ACETYLCYSTEINE 20 % IN SOLN
4.0000 mL | Freq: Three times a day (TID) | RESPIRATORY_TRACT | Status: DC
  Administered 2021-08-03 – 2021-08-05 (×6): 4 mL via RESPIRATORY_TRACT
  Filled 2021-08-03 (×7): qty 4

## 2021-08-03 MED ORDER — ALBUTEROL SULFATE (2.5 MG/3ML) 0.083% IN NEBU
2.5000 mg | INHALATION_SOLUTION | Freq: Three times a day (TID) | RESPIRATORY_TRACT | Status: DC
  Administered 2021-08-03 – 2021-08-05 (×6): 2.5 mg via RESPIRATORY_TRACT
  Filled 2021-08-03 (×7): qty 3

## 2021-08-03 MED ORDER — BENZONATATE 100 MG PO CAPS
100.0000 mg | ORAL_CAPSULE | Freq: Three times a day (TID) | ORAL | Status: DC
  Administered 2021-08-03 – 2021-08-08 (×16): 100 mg via ORAL
  Filled 2021-08-03 (×15): qty 1

## 2021-08-03 NOTE — Progress Notes (Signed)
PROGRESS NOTE    MUNACHIMSO RIGDON  KZS:[id number] DOB: 1933/12/24 DOA: 08/01/2021 PCP: [name], DO   Brief Narrative:   [name] is a 87 y.o. female with medical history significant for CKD 3, hypertension, diastolic CHF, atrial fibrillation. Patient presented to the ED with complaints of generalized weakness for 2 days, dry cough and difficulty breathing.  She was admitted with acute hypoxemic respiratory failure secondary to bronchitis and continues to have a significant cough with congestion.  She has been started on IV steroids and breathing treatments.  Assessment & Plan:   Principal Problem:   Acute respiratory failure with hypoxia (HCC) Active Problems:   Essential hypertension   Paroxysmal atrial fibrillation (HCC)   CKD (chronic kidney disease) stage 3, GFR 30-59 ml/min (HCC)   Chronic diastolic heart failure (HCC)   Hyponatremia   Acute bronchitis   Malnutrition of moderate degree   Acute hypoxemic respiratory failure secondary to bronchitis -Procalcitonin low and COVID and influenza test negative -Continue Solu-Medrol as well as breathing treatments as prescribed -Added Pulmicort and Mucomyst 12/16 -Tessalon perles 12/17 -Wean oxygen as tolerated   Hyponatremia -Currently improving and likely from poor oral intake as well as use of chlorthalidone -Continue IV normal saline and monitor in a.m.   Chronic anemia -Secondary to CKD and relative iron deficiency -Follows with Dr. Delton Coombes, continue monitor; has appt 12/20   Hypertension -Currently stable, maintain on home medications and avoid chlorthalidone while hydrating -Hold ACE inhibitor and atenolol for AKI and bronchospasms respectively -IV hydralazine added for hypertension control   History of left atrial tachycardia/PAF -Flecainide and atenolol -Followed by Dr. Percival Spanish and is not on anticoagulation   CKD stage IIIb -Fluctuating creatinine levels, continue to monitor and repeat  BMP -Hold ACEi for now   Chronic diastolic CHF -Continue gentle fluid hydration -Currently stable and compensated -Echo 2019 with EF 60-65% and grade 2 diastolic dysfunction   Stage I breast cancer -Follows with Dr. Delton Coombes -Resume letrozole   DVT prophylaxis: Heparin Code Status: DNR Family Communication: Discussed with daughter on phone 12/17 Disposition Plan:  Status is: Inpatient  Remains inpatient appropriate because: Need for IV medications.     Consultants:  None   Procedures:  See below  Antimicrobials:  None   Subjective: Patient seen and evaluated today with ongoing coughing and wheezing.  She is having a hard time coughing up some mucus.  Objective: Vitals:   08/02/21 2155 08/03/21 0612 08/03/21 0852 08/03/21 0929  BP: (!) 128/54 120/66 (!) 143/52   Pulse: 60 65 74   Resp: 18 18    Temp: 98.4 F (36.9 C) 98.4 F (36.9 C)    TempSrc:      SpO2: 97% 98%  97%  Weight:      Height:        Intake/Output Summary (Last 24 hours) at 08/03/2021 1057 Last data filed at 08/03/2021 0800 Gross per 24 hour  Intake 720 ml  Output --  Net 720 ml   Filed Weights   08/01/21 2200  Weight: 45.5 kg    Examination:  General exam: Appears calm and comfortable  Respiratory system: Clear to auscultation. Respiratory effort normal.  Currently on 1 L nasal cannula Cardiovascular system: S1 & S2 heard, RRR.  Gastrointestinal system: Abdomen is soft Central nervous system: Alert and awake Extremities: No edema Skin: No significant lesions noted Psychiatry: Flat affect.    Data Reviewed: I have personally reviewed following labs and imaging studies  CBC: Recent  Labs  Lab 08/01/21 1444 08/02/21 0525 08/03/21 0441  WBC 4.6 3.0* 10.0  NEUTROABS 3.9  --   --   HGB 9.2* 8.5* 8.2*  HCT 27.5* 26.9* 25.9*  MCV 87.6 88.5 89.9  PLT 149* 125* 371*   Basic Metabolic Panel: Recent Labs  Lab 08/01/21 1444 08/02/21 0525 08/03/21 0441  NA 126* 130*  129*  K 3.4* 4.3 4.3  CL 88* 95* 95*  CO2 27 24 25   GLUCOSE 128* 172* 168*  BUN 30* 35* 43*  CREATININE 1.59* 1.83* 1.80*  CALCIUM 8.7* 8.5* 8.3*  MG 2.0  --  1.9   GFR: Estimated Creatinine Clearance: 16.4 mL/min (A) (by C-G formula based on SCr of 1.8 mg/dL (H)). Liver Function Tests: Recent Labs  Lab 08/01/21 1444  AST 26  ALT 16  ALKPHOS 59  BILITOT 0.7  PROT 7.9  ALBUMIN 3.8   No results for input(s): LIPASE, AMYLASE in the last 168 hours. No results for input(s): AMMONIA in the last 168 hours. Coagulation Profile: No results for input(s): INR, PROTIME in the last 168 hours. Cardiac Enzymes: No results for input(s): CKTOTAL, CKMB, CKMBINDEX, TROPONINI in the last 168 hours. BNP (last 3 results) No results for input(s): PROBNP in the last 8760 hours. HbA1C: No results for input(s): HGBA1C in the last 72 hours. CBG: No results for input(s): GLUCAP in the last 168 hours. Lipid Profile: No results for input(s): CHOL, HDL, LDLCALC, TRIG, CHOLHDL, LDLDIRECT in the last 72 hours. Thyroid Function Tests: No results for input(s): TSH, T4TOTAL, FREET4, T3FREE, THYROIDAB in the last 72 hours. Anemia Panel: No results for input(s): VITAMINB12, FOLATE, FERRITIN, TIBC, IRON, RETICCTPCT in the last 72 hours. Sepsis Labs: Recent Labs  Lab 08/02/21 0525  PROCALCITON <0.10    Recent Results (from the past 240 hour(s))  Resp Panel by RT-PCR (Flu A&B, Covid) Nasopharyngeal Swab     Status: None   Collection Time: 08/01/21  2:44 PM   Specimen: Nasopharyngeal Swab; Nasopharyngeal(NP) swabs in vial transport medium  Result Value Ref Range Status   SARS Coronavirus 2 by RT PCR NEGATIVE NEGATIVE Final    Comment: (NOTE) SARS-CoV-2 target nucleic acids are NOT DETECTED.  The SARS-CoV-2 RNA is generally detectable in upper respiratory specimens during the acute phase of infection. The lowest concentration of SARS-CoV-2 viral copies this assay can detect is 138 copies/mL. A  negative result does not preclude SARS-Cov-2 infection and should not be used as the sole basis for treatment or other patient management decisions. A negative result may occur with  improper specimen collection/handling, submission of specimen other than nasopharyngeal swab, presence of viral mutation(s) within the areas targeted by this assay, and inadequate number of viral copies(<138 copies/mL). A negative result must be combined with clinical observations, patient history, and epidemiological information. The expected result is Negative.  Fact Sheet for Patients:  EntrepreneurPulse.com.au  Fact Sheet for Healthcare Providers:  IncredibleEmployment.be  This test is no t yet approved or cleared by the [place] FDA and  has been authorized for detection and/or diagnosis of SARS-CoV-2 by FDA under an Emergency Use Authorization (EUA). This EUA will remain  in effect (meaning this test can be used) for the duration of the COVID-19 declaration under Section 564(b)(1) of the Act, 21 U.S.C.section 360bbb-3(b)(1), unless the authorization is terminated  or revoked sooner.       Influenza A by PCR NEGATIVE NEGATIVE Final   Influenza B by PCR NEGATIVE NEGATIVE Final    Comment: (NOTE) The  Xpert Xpress SARS-CoV-2/FLU/RSV plus assay is intended as an aid in the diagnosis of influenza from Nasopharyngeal swab specimens and should not be used as a sole basis for treatment. Nasal washings and aspirates are unacceptable for Xpert Xpress SARS-CoV-2/FLU/RSV testing.  Fact Sheet for Patients: EntrepreneurPulse.com.au  Fact Sheet for Healthcare Providers: IncredibleEmployment.be  This test is not yet approved or cleared by the [place] FDA and has been authorized for detection and/or diagnosis of SARS-CoV-2 by FDA under an Emergency Use Authorization (EUA). This EUA will remain in effect (meaning this test can  be used) for the duration of the COVID-19 declaration under Section 564(b)(1) of the Act, 21 U.S.C. section 360bbb-3(b)(1), unless the authorization is terminated or revoked.  Performed at [facility], [address] [place], [place] [address]          Radiology Studies: DG Chest 2 View  Result Date: 08/01/2021 CLINICAL DATA:  Chest congestion. EXAM: CHEST - 2 VIEW COMPARISON:  March 10, 2021. FINDINGS: Stable cardiomegaly. Both lungs are clear. The visualized skeletal structures are unremarkable. IMPRESSION: No active cardiopulmonary disease. Electronically Signed   By: Marijo Conception M.D.   On: 08/01/2021 14:15   DG CHEST PORT 1 VIEW  Result Date: 08/03/2021 CLINICAL DATA:  Nonproductive cough starting today EXAM: PORTABLE CHEST 1 VIEW COMPARISON:  Chest radiograph 08/01/2021 FINDINGS: Heart is enlarged, unchanged.  The mediastinal contours are stable. There is no new or worsening focal airspace disease. There is no pleural effusion or pneumothorax. The bones are stable. IMPRESSION: Stable chest with no radiographic evidence of acute cardiopulmonary process. Electronically Signed   By: Valetta Mole M.D.   On: 08/03/2021 09:47        Scheduled Meds:  acetylcysteine  2 mL Nebulization BID   benzonatate  100 mg Oral TID   budesonide (PULMICORT) nebulizer solution  0.25 mg Nebulization BID   feeding supplement  237 mL Oral BID BM   flecainide  50 mg Oral BID   fluticasone  1 spray Each Nare BID   hydrALAZINE  100 mg Oral BID   letrozole  2.5 mg Oral Daily   levothyroxine  75 mcg Oral Q0600   methylPREDNISolone (SOLU-MEDROL) injection  60 mg Intravenous Q12H   multivitamin with minerals  1 tablet Oral Daily   Continuous Infusions:  sodium chloride 75 mL/hr at 08/03/21 1008     LOS: 1 day    Time spent: 35 minutes    Briscoe Daniello Darleen Crocker, DO [facility]  If 7PM-7AM, please contact night-coverage [email or web address] 08/03/2021, 10:57 AM

## 2021-08-04 LAB — BASIC METABOLIC PANEL
Anion gap: 7 (ref 5–15)
BUN: 43 mg/dL — ABNORMAL HIGH (ref 8–23)
CO2: 26 mmol/L (ref 22–32)
Calcium: 8.2 mg/dL — ABNORMAL LOW (ref 8.9–10.3)
Chloride: 98 mmol/L (ref 98–111)
Creatinine, Ser: 1.55 mg/dL — ABNORMAL HIGH (ref 0.44–1.00)
GFR, Estimated: 33 mL/min — ABNORMAL LOW (ref >60)
Glucose, Bld: 156 mg/dL — ABNORMAL HIGH (ref 70–99)
Potassium: 4.3 mmol/L (ref 3.5–5.1)
Sodium: 131 mmol/L — ABNORMAL LOW (ref 135–145)

## 2021-08-04 LAB — MAGNESIUM
Magnesium: 1.9 mg/dL (ref 1.7–2.4)
Magnesium: 1.9 mg/dL (ref 1.7–2.4)

## 2021-08-04 LAB — CBC
HCT: 25.8 % — ABNORMAL LOW (ref 36.0–46.0)
Hemoglobin: 8 g/dL — ABNORMAL LOW (ref 12.0–15.0)
MCH: 27.8 pg (ref 26.0–34.0)
MCHC: 31 g/dL (ref 30.0–36.0)
MCV: 89.6 fL (ref 80.0–100.0)
Platelets: 140 10*3/uL — ABNORMAL LOW (ref 150–400)
RBC: 2.88 MIL/uL — ABNORMAL LOW (ref 3.87–5.11)
RDW: 15.2 % (ref 11.5–15.5)
WBC: 13.5 10*3/uL — ABNORMAL HIGH (ref 4.0–10.5)
nRBC: 0 % (ref 0.0–0.2)

## 2021-08-04 MED ORDER — POLYVINYL ALCOHOL 1.4 % OP SOLN
1.0000 [drp] | Freq: Every day | OPHTHALMIC | Status: DC
  Administered 2021-08-04 – 2021-08-08 (×5): 1 [drp] via OPHTHALMIC
  Filled 2021-08-04 (×2): qty 15

## 2021-08-04 MED ORDER — MIRTAZAPINE 15 MG PO TABS
7.5000 mg | ORAL_TABLET | Freq: Every day | ORAL | Status: DC
  Administered 2021-08-04 – 2021-08-07 (×4): 7.5 mg via ORAL
  Filled 2021-08-04 (×4): qty 1

## 2021-08-04 MED ORDER — FERROUS SULFATE 325 (65 FE) MG PO TABS
325.0000 mg | ORAL_TABLET | Freq: Two times a day (BID) | ORAL | Status: DC
  Administered 2021-08-04 – 2021-08-08 (×8): 325 mg via ORAL
  Filled 2021-08-04 (×8): qty 1

## 2021-08-04 NOTE — Progress Notes (Signed)
PROGRESS NOTE    [name]  OVZ:[id number] DOB: 19-Jul-1934 DOA: 08/01/2021 PCP: [name], DO   Brief Narrative:   [name] is a 87 y.o. female with medical history significant for CKD 3, hypertension, diastolic CHF, atrial fibrillation. Patient presented to the ED with complaints of generalized weakness for 2 days, dry cough and difficulty breathing.  She was admitted with acute hypoxemic respiratory failure secondary to bronchitis and continues to have a significant cough with congestion.  She has been started on IV steroids and breathing treatments.  Assessment & Plan:   Principal Problem:   Acute respiratory failure with hypoxia (HCC) Active Problems:   Essential hypertension   Paroxysmal atrial fibrillation (HCC)   CKD (chronic kidney disease) stage 3, GFR 30-59 ml/min (HCC)   Chronic diastolic heart failure (HCC)   Hyponatremia   Acute bronchitis   Malnutrition of moderate degree   Acute hypoxemic respiratory failure secondary to bronchitis -Procalcitonin low and COVID and influenza test negative -Continue Solu-Medrol as well as breathing treatments as prescribed -Added Pulmicort and Mucomyst 12/16 -Tessalon perles 12/17 -Wean oxygen as tolerated   Hyponatremia-stable -Currently improving and likely from poor oral intake as well as use of chlorthalidone   Chronic anemia -Secondary to CKD and relative iron deficiency -Resume home iron supplementation -Follows with Dr. Delton Coombes, continue monitor; has appt 12/20   Hypertension -Currently stable, maintain on home medications and avoid chlorthalidone while hydrating -Hold ACE inhibitor and atenolol for AKI and bronchospasms respectively -IV hydralazine added for hypertension control   History of left atrial tachycardia/PAF -Flecainide and atenolol -Followed by Dr. Percival Spanish and is not on anticoagulation   CKD stage IIIb -Fluctuating creatinine levels, continue to monitor and repeat BMP -Hold  ACEi for now   Chronic diastolic CHF -Continue gentle fluid hydration -Currently stable and compensated -Echo 2019 with EF 60-65% and grade 2 diastolic dysfunction   Stage I breast cancer -Follows with Dr. Delton Coombes -Resume letrozole   DVT prophylaxis: Heparin Code Status: DNR Family Communication: Discussed with daughter on phone 12/18 Disposition Plan:  Status is: Inpatient   Remains inpatient appropriate because: Need for IV medications.         Consultants:  None   Procedures:  See below   Antimicrobials:  None   Subjective: Patient seen and evaluated today with ongoing cough and congestion as well as some wheezing.  She states that she is finally starting to get some more of her congestion up.  Objective: Vitals:   08/03/21 2108 08/04/21 0458 08/04/21 0912 08/04/21 0958  BP: (!) 179/64 (!) 193/74 (!) 157/68   Pulse: 63 76 78   Resp: 19 19    Temp: 97.9 F (36.6 C) 98 F (36.7 C)    TempSrc: Oral     SpO2: 97% 96% 96% 91%  Weight:      Height:        Intake/Output Summary (Last 24 hours) at 08/04/2021 1052 Last data filed at 08/04/2021 0848 Gross per 24 hour  Intake 1743.24 ml  Output 1 ml  Net 1742.24 ml   Filed Weights   08/01/21 2200  Weight: 45.5 kg    Examination:  General exam: Appears calm and comfortable  Respiratory system: Minimal wheezing with ongoing congestion. Respiratory effort normal.  Currently on 1 L nasal cannula Cardiovascular system: S1 & S2 heard, RRR.  Gastrointestinal system: Abdomen is soft Central nervous system: Alert and awake Extremities: No edema Skin: No significant lesions noted Psychiatry: Flat affect.  Data Reviewed: I have personally reviewed following labs and imaging studies  CBC: Recent Labs  Lab 08/01/21 1444 08/02/21 0525 08/03/21 0441 08/04/21 0436  WBC 4.6 3.0* 10.0 13.5*  NEUTROABS 3.9  --   --   --   HGB 9.2* 8.5* 8.2* 8.0*  HCT 27.5* 26.9* 25.9* 25.8*  MCV 87.6 88.5 89.9 89.6   PLT 149* 125* 131* 169*   Basic Metabolic Panel: Recent Labs  Lab 08/01/21 1444 08/02/21 0525 08/03/21 0441 08/04/21 0436  NA 126* 130* 129* 131*  K 3.4* 4.3 4.3 4.3  CL 88* 95* 95* 98  CO2 27 24 25 26   GLUCOSE 128* 172* 168* 156*  BUN 30* 35* 43* 43*  CREATININE 1.59* 1.83* 1.80* 1.55*  CALCIUM 8.7* 8.5* 8.3* 8.2*  MG 2.0  --  1.9 1.9   GFR: Estimated Creatinine Clearance: 19.1 mL/min (A) (by C-G formula based on SCr of 1.55 mg/dL (H)). Liver Function Tests: Recent Labs  Lab 08/01/21 1444  AST 26  ALT 16  ALKPHOS 59  BILITOT 0.7  PROT 7.9  ALBUMIN 3.8   No results for input(s): LIPASE, AMYLASE in the last 168 hours. No results for input(s): AMMONIA in the last 168 hours. Coagulation Profile: No results for input(s): INR, PROTIME in the last 168 hours. Cardiac Enzymes: No results for input(s): CKTOTAL, CKMB, CKMBINDEX, TROPONINI in the last 168 hours. BNP (last 3 results) No results for input(s): PROBNP in the last 8760 hours. HbA1C: No results for input(s): HGBA1C in the last 72 hours. CBG: No results for input(s): GLUCAP in the last 168 hours. Lipid Profile: No results for input(s): CHOL, HDL, LDLCALC, TRIG, CHOLHDL, LDLDIRECT in the last 72 hours. Thyroid Function Tests: No results for input(s): TSH, T4TOTAL, FREET4, T3FREE, THYROIDAB in the last 72 hours. Anemia Panel: No results for input(s): VITAMINB12, FOLATE, FERRITIN, TIBC, IRON, RETICCTPCT in the last 72 hours. Sepsis Labs: Recent Labs  Lab 08/02/21 0525  PROCALCITON <0.10    Recent Results (from the past 240 hour(s))  Resp Panel by RT-PCR (Flu A&B, Covid) Nasopharyngeal Swab     Status: None   Collection Time: 08/01/21  2:44 PM   Specimen: Nasopharyngeal Swab; Nasopharyngeal(NP) swabs in vial transport medium  Result Value Ref Range Status   SARS Coronavirus 2 by RT PCR NEGATIVE NEGATIVE Final    Comment: (NOTE) SARS-CoV-2 target nucleic acids are NOT DETECTED.  The SARS-CoV-2 RNA is  generally detectable in upper respiratory specimens during the acute phase of infection. The lowest concentration of SARS-CoV-2 viral copies this assay can detect is 138 copies/mL. A negative result does not preclude SARS-Cov-2 infection and should not be used as the sole basis for treatment or other patient management decisions. A negative result may occur with  improper specimen collection/handling, submission of specimen other than nasopharyngeal swab, presence of viral mutation(s) within the areas targeted by this assay, and inadequate number of viral copies(<138 copies/mL). A negative result must be combined with clinical observations, patient history, and epidemiological information. The expected result is Negative.  Fact Sheet for Patients:  EntrepreneurPulse.com.au  Fact Sheet for Healthcare Providers:  IncredibleEmployment.be  This test is no t yet approved or cleared by the [place] FDA and  has been authorized for detection and/or diagnosis of SARS-CoV-2 by FDA under an Emergency Use Authorization (EUA). This EUA will remain  in effect (meaning this test can be used) for the duration of the COVID-19 declaration under Section 564(b)(1) of the Act, 21 U.S.C.section 360bbb-3(b)(1), unless the  authorization is terminated  or revoked sooner.       Influenza A by PCR NEGATIVE NEGATIVE Final   Influenza B by PCR NEGATIVE NEGATIVE Final    Comment: (NOTE) The Xpert Xpress SARS-CoV-2/FLU/RSV plus assay is intended as an aid in the diagnosis of influenza from Nasopharyngeal swab specimens and should not be used as a sole basis for treatment. Nasal washings and aspirates are unacceptable for Xpert Xpress SARS-CoV-2/FLU/RSV testing.  Fact Sheet for Patients: EntrepreneurPulse.com.au  Fact Sheet for Healthcare Providers: IncredibleEmployment.be  This test is not yet approved or cleared by the [place] FDA and has been authorized for detection and/or diagnosis of SARS-CoV-2 by FDA under an Emergency Use Authorization (EUA). This EUA will remain in effect (meaning this test can be used) for the duration of the COVID-19 declaration under Section 564(b)(1) of the Act, 21 U.S.C. section 360bbb-3(b)(1), unless the authorization is terminated or revoked.  Performed at [facility], [address], [place], [place] [address]          Radiology Studies: DG CHEST PORT 1 VIEW  Result Date: 08/03/2021 CLINICAL DATA:  Nonproductive cough starting today EXAM: PORTABLE CHEST 1 VIEW COMPARISON:  Chest radiograph 08/01/2021 FINDINGS: Heart is enlarged, unchanged.  The mediastinal contours are stable. There is no new or worsening focal airspace disease. There is no pleural effusion or pneumothorax. The bones are stable. IMPRESSION: Stable chest with no radiographic evidence of acute cardiopulmonary process. Electronically Signed   By: Valetta Mole M.D.   On: 08/03/2021 09:47        Scheduled Meds:  acetylcysteine  4 mL Nebulization TID   albuterol  2.5 mg Nebulization TID   benzonatate  100 mg Oral TID   budesonide (PULMICORT) nebulizer solution  0.25 mg Nebulization BID   feeding supplement  237 mL Oral BID BM   ferrous sulfate  325 mg Oral BID WC   flecainide  50 mg Oral BID   fluticasone  1 spray Each Nare BID   hydrALAZINE  100 mg Oral BID   letrozole  2.5 mg Oral Daily   levothyroxine  75 mcg Oral Q0600   methylPREDNISolone (SOLU-MEDROL) injection  60 mg Intravenous Q12H   mirtazapine  7.5 mg Oral QHS   multivitamin with minerals  1 tablet Oral Daily   polyvinyl alcohol  1 drop Both Eyes Daily     LOS: 2 days    Time spent: 35 minutes    Larenzo Caples Darleen Crocker, DO [facility]  If 7PM-7AM, please contact night-coverage [email or web address] 08/04/2021, 10:52 AM

## 2021-08-05 DIAGNOSIS — J45909 Unspecified asthma, uncomplicated: Secondary | ICD-10-CM

## 2021-08-05 DIAGNOSIS — D649 Anemia, unspecified: Secondary | ICD-10-CM

## 2021-08-05 DIAGNOSIS — J9601 Acute respiratory failure with hypoxia: Principal | ICD-10-CM

## 2021-08-05 DIAGNOSIS — Z853 Personal history of malignant neoplasm of breast: Secondary | ICD-10-CM

## 2021-08-05 LAB — CBC
HCT: 26.5 % — ABNORMAL LOW (ref 36.0–46.0)
Hemoglobin: 8.7 g/dL — ABNORMAL LOW (ref 12.0–15.0)
MCH: 29.5 pg (ref 26.0–34.0)
MCHC: 32.8 g/dL (ref 30.0–36.0)
MCV: 89.8 fL (ref 80.0–100.0)
Platelets: 136 10*3/uL — ABNORMAL LOW (ref 150–400)
RBC: 2.95 MIL/uL — ABNORMAL LOW (ref 3.87–5.11)
RDW: 15 % (ref 11.5–15.5)
WBC: 10.9 10*3/uL — ABNORMAL HIGH (ref 4.0–10.5)
nRBC: 0 % (ref 0.0–0.2)

## 2021-08-05 LAB — BASIC METABOLIC PANEL
Anion gap: 9 (ref 5–15)
BUN: 43 mg/dL — ABNORMAL HIGH (ref 8–23)
CO2: 26 mmol/L (ref 22–32)
Calcium: 8.4 mg/dL — ABNORMAL LOW (ref 8.9–10.3)
Chloride: 99 mmol/L (ref 98–111)
Creatinine, Ser: 1.77 mg/dL — ABNORMAL HIGH (ref 0.44–1.00)
GFR, Estimated: 28 mL/min — ABNORMAL LOW (ref >60)
Glucose, Bld: 161 mg/dL — ABNORMAL HIGH (ref 70–99)
Potassium: 4.1 mmol/L (ref 3.5–5.1)
Sodium: 134 mmol/L — ABNORMAL LOW (ref 135–145)

## 2021-08-05 LAB — PROCALCITONIN: Procalcitonin: <0.1 ng/mL

## 2021-08-05 MED ORDER — HYDROCOD POLST-CPM POLST ER 10-8 MG/5ML PO SUER
5.0000 mL | Freq: Two times a day (BID) | ORAL | Status: DC
  Administered 2021-08-05 – 2021-08-08 (×7): 5 mL via ORAL
  Filled 2021-08-05 (×6): qty 5

## 2021-08-05 MED ORDER — SODIUM CHLORIDE 0.9 % IV SOLN
250.0000 mg | Freq: Every day | INTRAVENOUS | Status: AC
  Administered 2021-08-05 – 2021-08-06 (×2): 250 mg via INTRAVENOUS
  Filled 2021-08-05 (×2): qty 20

## 2021-08-05 MED ORDER — AZELASTINE HCL 0.1 % NA SOLN
1.0000 | Freq: Two times a day (BID) | NASAL | Status: DC
  Administered 2021-08-05 – 2021-08-08 (×6): 1 via NASAL
  Filled 2021-08-05: qty 30

## 2021-08-05 MED ORDER — REVEFENACIN 175 MCG/3ML IN SOLN
175.0000 ug | Freq: Every day | RESPIRATORY_TRACT | Status: DC
  Administered 2021-08-05 – 2021-08-08 (×4): 175 ug via RESPIRATORY_TRACT
  Filled 2021-08-05 (×4): qty 3

## 2021-08-05 MED ORDER — AMOXICILLIN-POT CLAVULANATE 500-125 MG PO TABS
500.0000 mg | ORAL_TABLET | Freq: Two times a day (BID) | ORAL | Status: DC
  Administered 2021-08-05 – 2021-08-08 (×6): 500 mg via ORAL
  Filled 2021-08-05 (×6): qty 1

## 2021-08-05 MED ORDER — BUDESONIDE 0.5 MG/2ML IN SUSP
0.5000 mg | Freq: Two times a day (BID) | RESPIRATORY_TRACT | Status: DC
Start: 2021-08-05 — End: 2021-08-08
  Administered 2021-08-05 – 2021-08-08 (×6): 0.5 mg via RESPIRATORY_TRACT
  Filled 2021-08-05 (×6): qty 2

## 2021-08-05 MED ORDER — SALINE SPRAY 0.65 % NA SOLN
2.0000 | Freq: Two times a day (BID) | NASAL | Status: DC
  Administered 2021-08-05 – 2021-08-08 (×6): 2 via NASAL
  Filled 2021-08-05 (×2): qty 44

## 2021-08-05 MED ORDER — ARFORMOTEROL TARTRATE 15 MCG/2ML IN NEBU
15.0000 ug | INHALATION_SOLUTION | Freq: Two times a day (BID) | RESPIRATORY_TRACT | Status: DC
  Administered 2021-08-05 – 2021-08-08 (×6): 15 ug via RESPIRATORY_TRACT
  Filled 2021-08-05 (×6): qty 2

## 2021-08-05 MED ORDER — FAMOTIDINE 20 MG PO TABS
20.0000 mg | ORAL_TABLET | Freq: Two times a day (BID) | ORAL | Status: DC
  Administered 2021-08-05 – 2021-08-06 (×2): 20 mg via ORAL
  Filled 2021-08-05 (×2): qty 1

## 2021-08-05 MED ORDER — GUAIFENESIN ER 600 MG PO TB12
1200.0000 mg | ORAL_TABLET | Freq: Two times a day (BID) | ORAL | Status: DC
  Administered 2021-08-05 – 2021-08-08 (×6): 1200 mg via ORAL
  Filled 2021-08-05 (×6): qty 2

## 2021-08-05 MED ORDER — MUSCLE RUB 10-15 % EX CREA
1.0000 "application " | TOPICAL_CREAM | CUTANEOUS | Status: DC | PRN
  Administered 2021-08-05 – 2021-08-06 (×3): 1 via TOPICAL
  Filled 2021-08-05: qty 85

## 2021-08-05 NOTE — Progress Notes (Shared)
[facility] [address], [place] [address]   Patient Care Team: [name], DO as PCP - General (Family Medicine) Minus Breeding, MD as PCP - Cardiology (Cardiology) Rolm Bookbinder, MD as Consulting Physician (General Surgery) Eppie Gibson, MD as Attending Physician (Radiation Oncology) Truitt Merle, MD as Consulting Physician (Hematology) Lavonna Monarch, MD as Consulting Physician (Dermatology) Warren Danes, PA-C as Physician Assistant (Dermatology) Elmarie Shiley, MD as Consulting Physician (Nephrology) [facility]  SUMMARY OF ONCOLOGIC HISTORY: Oncology History Overview Note  Cancer Staging Malignant neoplasm of lower-outer quadrant of right breast of female, estrogen receptor positive ([place]) Staging form: Breast, AJCC 8th Edition - Clinical stage from 02/22/2018: Stage IA (cT1c, cN0, cM0, G1, ER+, PR+, HER2-) - Signed by Truitt Merle, MD on 03/02/2018    Malignant neoplasm of lower-outer quadrant of right breast of female, estrogen receptor positive ([place])  02/12/2018 Mammogram   She had routine screening bilateral mammography on 02/12/2018 at [facility] with results showing: indeterminate irregular mass in the right breast.    02/16/2018 Mammogram   She underwent right diagnostic mammography with tomography and right breast ultrasonography at [facility] on 02/16/2018 showing: 1.1 cm irregular mass at the 8 O'clock position on the right breast   02/22/2018 Pathology Results   Diagnosis 1. Breast, right, needle core biopsy, lateral, 8 o'clock - INVASIVE AND IN SITU DUCTAL CARCINOMA. - SEE COMMENT. 2. Breast, right, needle core biopsy, 11 o'clock - FIBROCYSTIC CHANGES. - USUAL DUCTAL HYPERPLASIA. - THERE IS NO EVIDENCE OF MALIGNANCY.   02/22/2018 Receptors her2   IMMUNOHISTOCHEMICAL AND MORPHOMETRIC ANALYSIS PERFORMED MANUALLY Estrogen Receptor: 100%, POSITIVE, STRONG STAINING INTENSITY Progesterone Receptor: 40%, POSITIVE, MODERATE STAINING  INTENSITY Proliferation Marker Ki67: 1% Her2 Negative   02/22/2018 Cancer Staging   Staging form: Breast, AJCC 8th Edition - Clinical stage from 02/22/2018: Stage IA (cT1c, cN0, cM0, G1, ER+, PR+, HER2-) - Signed by Truitt Merle, MD on 03/02/2018    02/25/2018 Initial Diagnosis   Malignant neoplasm of lower-outer quadrant of right breast of female, estrogen receptor positive ([place])     CHIEF COMPLIANT: ***   INTERVAL HISTORY: [name] is a 87 y.o. female here today for follow up of her ***. Her last visit was on {XX/XX/XXXX}. ***   REVIEW OF SYSTEMS:   Review of Systems - Oncology  I have reviewed the past medical history, past surgical history, social history and family history with the patient and they are unchanged from previous note.   ALLERGIES:   is allergic to amlodipine, xeroform occlusive gauze strip [bismuth tribromoph-petrolatum], isosorbide nitrate, sulfonamide derivatives, and terazosin.   MEDICATIONS:  No current facility-administered medications for this visit.   No current outpatient medications on file.   Facility-Administered Medications Ordered in Other Visits  Medication Dose Route Frequency Provider Last Rate Last Admin   acetaminophen (TYLENOL) tablet 650 mg  650 mg Oral Q6H PRN Emokpae, Ejiroghene E, MD       Or   acetaminophen (TYLENOL) suppository 650 mg  650 mg Rectal Q6H PRN Emokpae, Ejiroghene E, MD       albuterol (PROVENTIL) (2.5 MG/3ML) 0.083% nebulizer solution 2.5 mg  2.5 mg Nebulization Q4H PRN Emokpae, Ejiroghene E, MD   2.5 mg at 08/05/21 0459   amoxicillin-clavulanate (AUGMENTIN) 500-125 MG per tablet 500 mg  500 mg of amoxicillin Oral Q12H Sood, Vineet, MD       arformoterol (BROVANA) nebulizer solution 15 mcg  15 mcg Nebulization BID Chesley Mires, MD  azelastine (ASTELIN) 0.1 % nasal spray 1 spray  1 spray Each Nare BID Chesley Mires, MD       benzonatate (TESSALON) capsule 100 mg  100 mg Oral TID Manuella Ghazi, Pratik D, DO   100 mg at  08/05/21 1043   budesonide (PULMICORT) nebulizer solution 0.5 mg  0.5 mg Nebulization BID Chesley Mires, MD       chlorpheniramine-HYDROcodone (TUSSIONEX) 10-8 MG/5ML suspension 5 mL  5 mL Oral Q12H Shah, Pratik D, DO   5 mL at 08/05/21 1046   famotidine (PEPCID) tablet 20 mg  20 mg Oral BID Chesley Mires, MD       feeding supplement (ENSURE ENLIVE / ENSURE PLUS) liquid 237 mL  237 mL Oral BID BM Manuella Ghazi, Pratik D, DO   237 mL at 08/05/21 1228   ferrous sulfate tablet 325 mg  325 mg Oral BID WC Shah, Pratik D, DO   325 mg at 08/05/21 1043   flecainide (TAMBOCOR) tablet 50 mg  50 mg Oral BID Emokpae, Ejiroghene E, MD   50 mg at 08/05/21 1043   fluticasone (FLONASE) 50 MCG/ACT nasal spray 1 spray  1 spray Each Nare BID Emokpae, Ejiroghene E, MD   1 spray at 08/05/21 1046   guaiFENesin (MUCINEX) 12 hr tablet 1,200 mg  1,200 mg Oral BID Chesley Mires, MD       hydrALAZINE (APRESOLINE) injection 10 mg  10 mg Intravenous Q4H PRN Manuella Ghazi, Pratik D, DO       hydrALAZINE (APRESOLINE) tablet 100 mg  100 mg Oral BID Emokpae, Ejiroghene E, MD   100 mg at 08/05/21 1043   letrozole [facility]) tablet 2.5 mg  2.5 mg Oral Daily Emokpae, Ejiroghene E, MD   2.5 mg at 08/05/21 1043   levothyroxine (SYNTHROID) tablet 75 mcg  75 mcg Oral Q0600 Emokpae, Ejiroghene E, MD   75 mcg at 08/05/21 0530   methylPREDNISolone sodium succinate (SOLU-MEDROL) 125 mg/2 mL injection 60 mg  60 mg Intravenous Q12H Emokpae, Ejiroghene E, MD   60 mg at 08/05/21 1228   mirtazapine (REMERON) tablet 7.5 mg  7.5 mg Oral QHS Manuella Ghazi, Pratik D, DO   7.5 mg at 08/04/21 2111   multivitamin with minerals tablet 1 tablet  1 tablet Oral Daily Manuella Ghazi, Pratik D, DO   1 tablet at 08/05/21 1043   Muscle Rub CREA 1 application  1 application Topical PRN Manuella Ghazi, Pratik D, DO       polyethylene glycol (MIRALAX / GLYCOLAX) packet 17 g  17 g Oral Daily PRN Emokpae, Ejiroghene E, MD       polyvinyl alcohol (LIQUIFILM TEARS) 1.4 % ophthalmic solution 1 drop  1 drop Both Eyes  Daily Shah, Pratik D, DO   1 drop at 08/05/21 1047   revefenacin (YUPELRI) nebulizer solution 175 mcg  175 mcg Nebulization Daily Sood, Vineet, MD       sodium chloride (OCEAN) 0.65 % nasal spray 2 spray  2 spray Each Nare BID Chesley Mires, MD         PHYSICAL EXAMINATION: Performance status (ECOG): {CHL ONC GO:[id number]}  There were no vitals filed for this visit. Wt Readings from Last 3 Encounters:  08/01/21 100 lb 5 oz (45.5 kg)  08/01/21 109 lb (49.4 kg)  07/25/21 109 lb (49.4 kg)   Physical Exam  Breast Exam Chaperone: {Blank single:19197::"n/a","Daniel Khashchuk, MD","Kirstyn Evans"}     LABORATORY DATA:  I have reviewed the data as listed CMP Latest Ref Rng & Units 08/05/2021 08/04/2021 08/03/2021  Glucose 70 - 99 mg/dL 161(H) 156(H) 168(H)  BUN 8 - 23 mg/dL 43(H) 43(H) 43(H)  Creatinine 0.44 - 1.00 mg/dL 1.77(H) 1.55(H) 1.80(H)  Sodium 135 - 145 mmol/L 134(L) 131(L) 129(L)  Potassium 3.5 - 5.1 mmol/L 4.1 4.3 4.3  Chloride 98 - 111 mmol/L 99 98 95(L)  CO2 22 - 32 mmol/L '26 26 25  ' Calcium 8.9 - 10.3 mg/dL 8.4(L) 8.2(L) 8.3(L)  Total Protein 6.5 - 8.1 g/dL - - -  Total Bilirubin 0.3 - 1.2 mg/dL - - -  Alkaline Phos 38 - 126 U/L - - -  AST 15 - 41 U/L - - -  ALT 0 - 44 U/L - - -   No results found for: [id number] Lab Results  Component Value Date   WBC 10.9 (H) 08/05/2021   HGB 8.7 (L) 08/05/2021   HCT 26.5 (L) 08/05/2021   MCV 89.8 08/05/2021   PLT 136 (L) 08/05/2021   NEUTROABS 3.9 08/01/2021    ASSESSMENT:  ***   PLAN:  ***  Breast Cancer therapy associated bone loss: I have recommended calcium, Vitamin D and weight bearing exercises.  Orders placed this encounter:  No orders of the defined types were placed in this encounter.   The patient has a good understanding of the overall plan. She agrees with it. She will call with any problems that may develop before the next visit here.  Derek Jack, MD [facility] [phone]   I, ***, am acting as a scribe for Dr. Derek Jack.  {Add Barista Statement}

## 2021-08-05 NOTE — Progress Notes (Signed)
PROGRESS NOTE    [name]  AST:[id number] DOB: 23-Dec-1933 DOA: 08/01/2021 PCP: [name], DO   Brief Narrative:   [name] is a 87 y.o. female with medical history significant for CKD 3, hypertension, diastolic CHF, atrial fibrillation. Patient presented to the ED with complaints of generalized weakness for 2 days, dry cough and difficulty breathing.  She was admitted with acute hypoxemic respiratory failure secondary to bronchitis and continues to have a significant cough with congestion.  She has been started on IV steroids and breathing treatments.  She continues to have some persistent wheezing and cough but does not appear to be improving much despite medical treatment.  Assessment & Plan:   Principal Problem:   Acute respiratory failure with hypoxia (HCC) Active Problems:   Essential hypertension   Paroxysmal atrial fibrillation (HCC)   CKD (chronic kidney disease) stage 3, GFR 30-59 ml/min (HCC)   Chronic diastolic heart failure (HCC)   Hyponatremia   Acute bronchitis   Malnutrition of moderate degree   Acute hypoxemic respiratory failure secondary to bronchitis -Procalcitonin low and COVID and influenza test negative -Continue Solu-Medrol as well as breathing treatments as prescribed -Added Pulmicort and Mucomyst 12/16 -Tessalon perles 12/17 -Tussionex added 12/19 -Weaned off O2 -We will ask pulmonary to ensure medical management is optimized 12/19   Hyponatremia-stable -Currently improving and likely from poor oral intake as well as use of chlorthalidone   Chronic anemia -Secondary to CKD and relative iron deficiency -Resume home iron supplementation -Follows with Dr. Delton Coombes, continue monitor; has appt 12/20, will ask Dr. Delton Coombes to see inpatient if possible   Hypertension -Currently stable, maintain on home medications and avoid chlorthalidone while hydrating -Hold ACE inhibitor and atenolol for AKI and bronchospasms  respectively -IV hydralazine added for hypertension control   History of left atrial tachycardia/PAF -Flecainide and atenolol -Followed by Dr. Percival Spanish and is not on anticoagulation   CKD stage IIIb -Fluctuating creatinine levels, continue to monitor and repeat BMP -Hold ACEi for now   Chronic diastolic CHF -Currently stable and compensated -Echo 2019 with EF 60-65% and grade 2 diastolic dysfunction   Stage I breast cancer -Follows with Dr. Delton Coombes -Resume letrozole   DVT prophylaxis: Heparin Code Status: DNR Family Communication: Discussed with daughter on phone 12/19 Disposition Plan:  Status is: Inpatient   Remains inpatient appropriate because: Need for IV medications.      Consultants:  PCCM Oncology   Procedures:  See below   Antimicrobials:  None  Subjective: Patient seen and evaluated today with with ongoing cough and diffuse wheezing with minimal to no improvement over the last 1-2 days.  She has been weaned off oxygen.  Objective: Vitals:   08/04/21 2012 08/04/21 2106 08/05/21 0510 08/05/21 0854  BP:  (!) 179/65 (!) 189/77   Pulse: 77 77 80   Resp: 18 16 16    Temp:  (!) 97.5 F (36.4 C) 98 F (36.7 C)   TempSrc:  Oral Oral   SpO2: 94% 96% 95% 93%  Weight:      Height:        Intake/Output Summary (Last 24 hours) at 08/05/2021 1109 Last data filed at 08/05/2021 0900 Gross per 24 hour  Intake 960 ml  Output --  Net 960 ml   Filed Weights   08/01/21 2200  Weight: 45.5 kg    Examination:  General exam: Appears calm and comfortable  Respiratory system: Diffuse wheezing bilaterally with rhonchi.  On room air. Cardiovascular system: S1 &  S2 heard, RRR.  Gastrointestinal system: Abdomen is soft Central nervous system: Alert and awake Extremities: No edema Skin: No significant lesions noted Psychiatry: Flat affect.    Data Reviewed: I have personally reviewed following labs and imaging studies  CBC: Recent Labs  Lab  08/01/21 1444 08/02/21 0525 08/03/21 0441 08/04/21 0436 08/05/21 0519  WBC 4.6 3.0* 10.0 13.5* 10.9*  NEUTROABS 3.9  --   --   --   --   HGB 9.2* 8.5* 8.2* 8.0* 8.7*  HCT 27.5* 26.9* 25.9* 25.8* 26.5*  MCV 87.6 88.5 89.9 89.6 89.8  PLT 149* 125* 131* 140* 644*   Basic Metabolic Panel: Recent Labs  Lab 08/01/21 1444 08/02/21 0525 08/03/21 0441 08/04/21 0436 08/04/21 1055 08/05/21 0519  NA 126* 130* 129* 131*  --  134*  K 3.4* 4.3 4.3 4.3  --  4.1  CL 88* 95* 95* 98  --  99  CO2 27 24 25 26   --  26  GLUCOSE 128* 172* 168* 156*  --  161*  BUN 30* 35* 43* 43*  --  43*  CREATININE 1.59* 1.83* 1.80* 1.55*  --  1.77*  CALCIUM 8.7* 8.5* 8.3* 8.2*  --  8.4*  MG 2.0  --  1.9 1.9 1.9  --    GFR: Estimated Creatinine Clearance: 16.7 mL/min (A) (by C-G formula based on SCr of 1.77 mg/dL (H)). Liver Function Tests: Recent Labs  Lab 08/01/21 1444  AST 26  ALT 16  ALKPHOS 59  BILITOT 0.7  PROT 7.9  ALBUMIN 3.8   No results for input(s): LIPASE, AMYLASE in the last 168 hours. No results for input(s): AMMONIA in the last 168 hours. Coagulation Profile: No results for input(s): INR, PROTIME in the last 168 hours. Cardiac Enzymes: No results for input(s): CKTOTAL, CKMB, CKMBINDEX, TROPONINI in the last 168 hours. BNP (last 3 results) No results for input(s): PROBNP in the last 8760 hours. HbA1C: No results for input(s): HGBA1C in the last 72 hours. CBG: No results for input(s): GLUCAP in the last 168 hours. Lipid Profile: No results for input(s): CHOL, HDL, LDLCALC, TRIG, CHOLHDL, LDLDIRECT in the last 72 hours. Thyroid Function Tests: No results for input(s): TSH, T4TOTAL, FREET4, T3FREE, THYROIDAB in the last 72 hours. Anemia Panel: No results for input(s): VITAMINB12, FOLATE, FERRITIN, TIBC, IRON, RETICCTPCT in the last 72 hours. Sepsis Labs: Recent Labs  Lab 08/02/21 0525 08/05/21 0519  PROCALCITON <0.10 <0.10    Recent Results (from the past 240 hour(s))  Resp  Panel by RT-PCR (Flu A&B, Covid) Nasopharyngeal Swab     Status: None   Collection Time: 08/01/21  2:44 PM   Specimen: Nasopharyngeal Swab; Nasopharyngeal(NP) swabs in vial transport medium  Result Value Ref Range Status   SARS Coronavirus 2 by RT PCR NEGATIVE NEGATIVE Final    Comment: (NOTE) SARS-CoV-2 target nucleic acids are NOT DETECTED.  The SARS-CoV-2 RNA is generally detectable in upper respiratory specimens during the acute phase of infection. The lowest concentration of SARS-CoV-2 viral copies this assay can detect is 138 copies/mL. A negative result does not preclude SARS-Cov-2 infection and should not be used as the sole basis for treatment or other patient management decisions. A negative result may occur with  improper specimen collection/handling, submission of specimen other than nasopharyngeal swab, presence of viral mutation(s) within the areas targeted by this assay, and inadequate number of viral copies(<138 copies/mL). A negative result must be combined with clinical observations, patient history, and epidemiological information. The expected result  is Negative.  Fact Sheet for Patients:  EntrepreneurPulse.com.au  Fact Sheet for Healthcare Providers:  IncredibleEmployment.be  This test is no t yet approved or cleared by the [place] FDA and  has been authorized for detection and/or diagnosis of SARS-CoV-2 by FDA under an Emergency Use Authorization (EUA). This EUA will remain  in effect (meaning this test can be used) for the duration of the COVID-19 declaration under Section 564(b)(1) of the Act, 21 U.S.C.section 360bbb-3(b)(1), unless the authorization is terminated  or revoked sooner.       Influenza A by PCR NEGATIVE NEGATIVE Final   Influenza B by PCR NEGATIVE NEGATIVE Final    Comment: (NOTE) The Xpert Xpress SARS-CoV-2/FLU/RSV plus assay is intended as an aid in the diagnosis of influenza from Nasopharyngeal  swab specimens and should not be used as a sole basis for treatment. Nasal washings and aspirates are unacceptable for Xpert Xpress SARS-CoV-2/FLU/RSV testing.  Fact Sheet for Patients: EntrepreneurPulse.com.au  Fact Sheet for Healthcare Providers: IncredibleEmployment.be  This test is not yet approved or cleared by the [place] FDA and has been authorized for detection and/or diagnosis of SARS-CoV-2 by FDA under an Emergency Use Authorization (EUA). This EUA will remain in effect (meaning this test can be used) for the duration of the COVID-19 declaration under Section 564(b)(1) of the Act, 21 U.S.C. section 360bbb-3(b)(1), unless the authorization is terminated or revoked.  Performed at [facility], [address], [place], [place] [address]          Radiology Studies: No results found.      Scheduled Meds:  acetylcysteine  4 mL Nebulization TID   albuterol  2.5 mg Nebulization TID   benzonatate  100 mg Oral TID   budesonide (PULMICORT) nebulizer solution  0.25 mg Nebulization BID   chlorpheniramine-HYDROcodone  5 mL Oral Q12H   feeding supplement  237 mL Oral BID BM   ferrous sulfate  325 mg Oral BID WC   flecainide  50 mg Oral BID   fluticasone  1 spray Each Nare BID   hydrALAZINE  100 mg Oral BID   letrozole  2.5 mg Oral Daily   levothyroxine  75 mcg Oral Q0600   methylPREDNISolone (SOLU-MEDROL) injection  60 mg Intravenous Q12H   mirtazapine  7.5 mg Oral QHS   multivitamin with minerals  1 tablet Oral Daily   polyvinyl alcohol  1 drop Both Eyes Daily     LOS: 3 days    Time spent: 35 minutes    Margit Batte Darleen Crocker, DO [facility]  If 7PM-7AM, please contact night-coverage [email or web address] 08/05/2021, 11:09 AM

## 2021-08-05 NOTE — Evaluation (Addendum)
Physical Therapy Evaluation Only Patient Details Name: [name] MRN: [id number] DOB: [birth year] Today's Date: 08/05/2021  History of Present Illness  [name] is a 87 y.o. female who presents with generalized weakness, dry cough and difficulty breathing complaints. Pt admitted with acute hypoxemic respiratory failure secondary to bronchitis on 12/15. PMH: CKD 3, HTN, CHF, afib, breast cancer   Clinical Impression  Pt ind at baseline, not using AD but has DME at home, has 2 daughters locally who can check on her PRN if needed. Pt reports walking around room and going to restroom independently since being in hospital. Pt ambulates 150 ft in hallway with RW, slightly decreased cadence, good balance and no SOB. Pt then able to ambulate additional 150 ft without AD, slightly decreased cadence and arm swing, good balance and no SOB. Pt with audible wheezing and coughing at times, on RA with SpO2 93-96%. Educated pt on time OOB, safety with mobility and ambulating in hallway with nursing and pt verbalizes agreement. No acute care needs identified at this time, will sign off.     Recommendations for follow up therapy are one component of a multi-disciplinary discharge planning process, led by the attending physician.  Recommendations may be updated based on patient status, additional functional criteria and insurance authorization.  Follow Up Recommendations No PT follow up    Assistance Recommended at Discharge    Functional Status Assessment    Equipment Recommendations  None recommended by PT    Recommendations for Other Services       Precautions / Restrictions Precautions Precautions: None Restrictions Weight Bearing Restrictions: No      Mobility  Bed Mobility Overal bed mobility: Modified Independent   Transfers Overall transfer level: Modified independent Equipment used: None   Ambulation/Gait Ambulation/Gait assistance: Supervision;Modified independent  (Device/Increase time) Gait Distance (Feet): 150 Feet (x2) Assistive device: Rolling walker (2 wheels);None Gait Pattern/deviations: WFL(Within Functional Limits) Gait velocity: slightly decreased  General Gait Details: pt ambulates with RW, step through pattern, slightly decreased cadence, no LOB or unsteadiness noted, completes direction changes and head turns without LOB; pt ambulates without any device with step through pattern, slightly decreased cadence with slightly decreased BUE arm swing, completes direction changes and head turns without LOB  Stairs            Wheelchair Mobility    Modified Rankin (Stroke Patients Only)       Balance Overall balance assessment: No apparent balance deficits (not formally assessed)       Pertinent Vitals/Pain Pain Assessment: 0-10 Pain Score: 6  Pain Location: mid back/L paraspinals Pain Descriptors / Indicators: Sore Pain Intervention(s): Limited activity within patient's tolerance;Monitored during session;Repositioned    Home Living Family/patient expects to be discharged to:: Private residence Living Arrangements: Alone Available Help at Discharge: Family;Available PRN/intermittently Type of Home: House Home Access: Stairs to enter Entrance Stairs-Rails: Right Entrance Stairs-Number of Steps: 2   Home Layout: One level Home Equipment: Advice worker (2 wheels);Cane - single point      Prior Function Prior Level of Function : Independent/Modified Independent  Mobility Comments: pt reports ind with mobility without AD, community ambulator, denies falls ADLs Comments: pt reports ind with ADLs/IADLs, drives     Hand Dominance        Extremity/Trunk Assessment   Upper Extremity Assessment Upper Extremity Assessment: Overall WFL for tasks assessed    Lower Extremity Assessment Lower Extremity Assessment: Overall WFL for tasks assessed    Cervical / Trunk Assessment  Cervical / Trunk Assessment:  Kyphotic  Communication   Communication: No difficulties  Cognition Arousal/Alertness: Awake/alert Behavior During Therapy: WFL for tasks assessed/performed Overall Cognitive Status: Within Functional Limits for tasks assessed     General Comments General comments (skin integrity, edema, etc.): Pt on RA with SpO2 93-96% during evaluation    Exercises     Assessment/Plan    PT Assessment Patient does not need any further PT services  PT Problem List         PT Treatment Interventions      PT Goals (Current goals can be found in the Care Plan section)  Acute Rehab PT Goals Patient Stated Goal: "walk as much as I can" PT Goal Formulation: All assessment and education complete, DC therapy    Frequency     Barriers to discharge        Co-evaluation               AM-PAC PT "6 Clicks" Mobility  Outcome Measure Help needed turning from your back to your side while in a flat bed without using bedrails?: None Help needed moving from lying on your back to sitting on the side of a flat bed without using bedrails?: None Help needed moving to and from a bed to a chair (including a wheelchair)?: None Help needed standing up from a chair using your arms (e.g., wheelchair or bedside chair)?: None Help needed to walk in hospital room?: None Help needed climbing 3-5 steps with a railing? : A Little 6 Click Score: 23    End of Session   Activity Tolerance: Patient tolerated treatment well Patient left: in chair;with call bell/phone within reach Nurse Communication: Mobility status;Patient requests pain meds;Other (comment) (SpO2 on RA) PT Visit Diagnosis: Other abnormalities of gait and mobility (R26.89)    Time: 1320-1350 PT Time Calculation (min) (ACUTE ONLY): 30 min   Charges:   PT Evaluation $PT Eval Low Complexity: 1 Low PT Treatments $Gait Training: 8-22 mins         Tori Shenequa Howse PT, DPT 08/05/21, 3:07 PM

## 2021-08-05 NOTE — Consult Note (Signed)
Pulmonary and Critical Care Medicine   Patient name: [name] Admit date: 08/01/2021  DOB: Apr 14, 1934 LOS: 3  MRN: [id number] Consult date: 08/05/2021  Referring provider: Dr. Manuella Ghazi, [facility] CC: Cough    History:  87 yo female presented to [facility] ER with weakness, dyspnea and dry cough.  SpO2 in ER down to 83% on room air.  Noted to have wheezing and started on bronchodilator therapy and supplemental oxygen in ER.  She also started on solumedrol.  She had persistent cough and wheezing, and PCCM asked to assess.  Past medical history:  CKD 3a, HTN, Diastolic CHF, A fib, Anemia, OA, Asthma, HLD, Hypothyroidism, Rt renal artery stenosis, Vertigo, Breast cancer, Esophageal candidiasis  Significant events:  12/15 Admit  Studies:    Micro:  COVID/Flu 12/15 >> negative  Lines:     Antibiotics:  Augmentin 12/19 >>   Consults:      Interim history:  She has persistent chest congestion, cough, and wheezing.  Can't bring up sputum.  Has pain in her lower back from coughing.  Has been blowing her nose more.  Still feels weak.  Doesn't feel like she is ready to go home yet.  Vital signs:  BP (!) 171/60 (BP Location: Left Arm)    Pulse 77    Temp 97.9 F (36.6 C) (Oral)    Resp 18    Ht 5\' 5"  (1.651 m)    Wt 45.5 kg    SpO2 90%    BMI 16.69 kg/m   Intake/output:  I/O last 3 completed shifts: In: 1479.8 [P.O.:1200; I.V.:279.8] Out: -    Physical exam:   General - alert, frail appearing, frequent coughing spells Eyes - pupils reactive ENT - dry nasal mucosa, raspy voice, no stridor Cardiac - regular rate/rhythm, no murmur Chest - b/l expiratory wheezing Abdomen - soft, non tender, + bowel sounds Extremities - no cyanosis, clubbing, or edema Skin - no rashes Neuro - normal strength, moves extremities, follows commands Psych - anxious  Best practice:   DVT - SCDs SUP - Pepcid Nutrition - heart healthy   Assessment/plan:   Persistent  cough and wheezing. - likely combination of acute asthmatic bronchitis, rhinitis with post nasal drip, and possible reflux - continue solumedrol 60 mg q12h - will changed BDs to yupelri, brovana, and pulmicort - add mucinex - prn tessalon, tussionex - will stop mucomyst - add augmentin - add nasal irrigation and azelastine, and continue flonase - add pepcid - suspect she will need at least another 24 to 48 hrs of IV therapy in hospital  Resolved hospital problems:    Goals of care/Family discussions:  Code status: DNR  Labs:   CMP Latest Ref Rng & Units 08/05/2021 08/04/2021 08/03/2021  Glucose 70 - 99 mg/dL 161(H) 156(H) 168(H)  BUN 8 - 23 mg/dL 43(H) 43(H) 43(H)  Creatinine 0.44 - 1.00 mg/dL 1.77(H) 1.55(H) 1.80(H)  Sodium 135 - 145 mmol/L 134(L) 131(L) 129(L)  Potassium 3.5 - 5.1 mmol/L 4.1 4.3 4.3  Chloride 98 - 111 mmol/L 99 98 95(L)  CO2 22 - 32 mmol/L 26 26 25   Calcium 8.9 - 10.3 mg/dL 8.4(L) 8.2(L) 8.3(L)  Total Protein 6.5 - 8.1 g/dL - - -  Total Bilirubin 0.3 - 1.2 mg/dL - - -  Alkaline Phos 38 - 126 U/L - - -  AST 15 - 41 U/L - - -  ALT 0 - 44 U/L - - -    CBC Latest Ref Rng & Units 08/05/2021  08/04/2021 08/03/2021  WBC 4.0 - 10.5 K/uL 10.9(H) 13.5(H) 10.0  Hemoglobin 12.0 - 15.0 g/dL 8.7(L) 8.0(L) 8.2(L)  Hematocrit 36.0 - 46.0 % 26.5(L) 25.8(L) 25.9(L)  Platelets 150 - 400 K/uL 136(L) 140(L) 131(L)    ABG    Component Value Date/Time   TCO2 33 (H) 04/09/2021 1213    CBG (last 3)  No results for input(s): GLUCAP in the last 72 hours.   Past surgical history:  She  has a past surgical history that includes Thyroidectomy, partial (Right, 1981); ir generic historical (08/29/2016); ir generic historical (08/29/2016); Knee arthroscopy (Right); Breast lumpectomy with radioactive seed localization (Right, 04/08/2018); Colonoscopy (2016); Esophagogastroduodenoscopy (2016); Esophagogastroduodenoscopy (egd) with propofol (N/A, 04/09/2021); and biopsy  (04/09/2021).  Social history:  She  reports that she has never smoked. She has never used smokeless tobacco. She reports that she does not drink alcohol and does not use drugs.   Review of systems:  Reviewed and negative  Family history:  Her family history includes Cancer in her maternal grandmother; Hypercholesterolemia in her daughter and daughter; Hypertension in her brother and daughter; Lung cancer in her maternal uncle and maternal uncle; Stroke in her father; Thyroid disease in her mother.    Medications:   No current facility-administered medications on file prior to encounter.   Current Outpatient Medications on File Prior to Encounter  Medication Sig   atenolol-chlorthalidone (TENORETIC) 50-25 MG tablet TAKE 1 TABLET BY MOUTH EVERY DAY   atorvastatin (LIPITOR) 20 MG tablet TAKE 1/2 TABLET BY MOUTH EVERY DAY   azelastine (ASTELIN) 0.1 % nasal spray Place 1 spray into both nostrils 2 (two) times daily.   benzonatate (TESSALON) 200 MG capsule Take 1 capsule (200 mg total) by mouth 2 (two) times daily as needed for cough.   calcium-vitamin D (OSCAL WITH D) 500-200 MG-UNIT per tablet Take 2 tablets by mouth at bedtime.   cholecalciferol (VITAMIN D) 1000 units tablet Take 1,000 Units by mouth daily.   diclofenac Sodium (VOLTAREN) 1 % GEL Apply 2 g topically 4 (four) times daily.   ferrous sulfate 325 (65 FE) MG tablet Take 325 mg by mouth 2 (two) times daily with a meal.   flecainide (TAMBOCOR) 50 MG tablet Take 1 tablet (50 mg total) by mouth 2 (two) times daily. PATIENT NEEDS TO KEEP SCHEDULED APPT FOR FUTURE REFILLS.   fluticasone (FLONASE) 50 MCG/ACT nasal spray Place 1 spray into both nostrils 2 (two) times daily.   hydrALAZINE (APRESOLINE) 100 MG tablet Take 100 mg by mouth 2 (two) times daily.   hydroxypropyl methylcellulose / hypromellose (ISOPTO TEARS / GONIOVISC) 2.5 % ophthalmic solution Place 1 drop into both eyes daily.   letrozole (FEMARA) 2.5 MG tablet TAKE 1 TABLET BY  MOUTH EVERY DAY   levothyroxine (SYNTHROID) 75 MCG tablet Take 1 tablet (75 mcg total) by mouth daily.   lisinopril (ZESTRIL) 20 MG tablet Take 20 mg by mouth in the morning and at bedtime. Per Dr Posey Pronto 01/2021   mirtazapine (REMERON) 7.5 MG tablet Take 1 tablet (7.5 mg total) by mouth at bedtime.   ondansetron (ZOFRAN ODT) 4 MG disintegrating tablet Take 1 tablet (4 mg total) by mouth every 8 (eight) hours as needed.   acetaminophen (TYLENOL) 325 MG tablet Take 325 mg by mouth daily as needed for moderate pain or headache.     Signature:  Chesley Mires, MD [place] Pager - [phone] 08/05/2021, 5:28 PM

## 2021-08-05 NOTE — TOC Initial Note (Signed)
Transition of Care [facility]) - Initial/Assessment Note    Patient Details  Name: [name] MRN: [id number] Date of Birth: May 09, 1934  Transition of [facility]) CM/SW Contact:    [name], [name] Phone Number: 08/05/2021, 8:34 AM  Clinical Narrative:   Pt admitted with acute respiratory failure with hypoxia secondary to bronchitis. Assessment completed due to high risk readmission score. Pt reports she lives alone. She manages well at home and reports no DME/home health. Pt drives herself to appointments. She plans to return home when medically stable. No needs reported at this time. PT evaluation pending. TOC will continue to follow.                Expected Discharge Plan: Home/Self Care Barriers to Discharge: Continued Medical Work up   Patient Goals and CMS Choice Patient states their goals for this hospitalization and ongoing recovery are:: return home   Choice offered to / list presented to : Patient  Expected Discharge Plan and Services Expected Discharge Plan: Home/Self Care In-house Referral: Clinical Social Work     Living arrangements for the past 2 months: Single Family Home                 DME Arranged: N/A DME Agency: NA                  Prior Living Arrangements/Services Living arrangements for the past 2 months: [place] Lives with:: Self Patient language and need for interpreter reviewed:: Yes Do you feel safe going back to the place where you live?: Yes      Need for Family Participation in Patient Care: No (Comment)     Criminal Activity/Legal Involvement Pertinent to Current Situation/Hospitalization: No - Comment as needed  Activities of Daily Living Home Assistive Devices/Equipment: None ADL Screening (condition at time of admission) Patient's cognitive ability adequate to safely complete daily activities?: Yes Is the patient deaf or have difficulty hearing?: No Does the patient have difficulty seeing, even when  wearing glasses/contacts?: No Does the patient have difficulty concentrating, remembering, or making decisions?: No Patient able to express need for assistance with ADLs?: Yes Does the patient have difficulty dressing or bathing?: No Independently performs ADLs?: Yes (appropriate for developmental age) Does the patient have difficulty walking or climbing stairs?: No Weakness of Legs: None Weakness of Arms/Hands: None  Permission Sought/Granted                  Emotional Assessment   Attitude/Demeanor/Rapport: Engaged Affect (typically observed): Appropriate Orientation: : Oriented to Self, Oriented to Place, Oriented to  Time, Oriented to Situation Alcohol / Substance Use: Not Applicable Psych Involvement: No (comment)  Admission diagnosis:  Acute respiratory failure with hypoxia ([place]) [J96.01] Patient Active Problem List   Diagnosis Date Noted   Malnutrition of moderate degree 08/03/2021   Acute respiratory failure with hypoxia ([place]) 08/01/2021   Acute bronchitis 08/01/2021   Decreased appetite 07/25/2021   Protein-calorie malnutrition ([place]) 07/25/2021   Skin tear of right lower leg without complication 47/82/9562   Non-intractable vomiting 04/08/2021   Edema 02/22/2021   Constipation 01/23/2021   Osteoarthritis of knee 12/13/2020   History of arthroscopy of knee 12/13/2020   Pain in joint of left knee 12/13/2020   Atrial tachycardia ([place]) 07/26/2019   Aortic valve sclerosis 07/26/2019   Aortic valve stenosis 12/01/2018   Breast cancer, right ([place]) 04/08/2018   Malignant neoplasm of lower-outer quadrant of right breast of female, estrogen receptor positive ([place])  02/25/2018   Tear of lateral meniscus of knee 10/27/2017   Vertigo 09/09/2016   Renal vascular disease 10/31/2015   Nodule of right lung 10/31/2015   Asthma without status asthmaticus 10/31/2015   Essential hypertension, malignant    Hypothyroidism 10/15/2015   Pancytopenia ([place]) 10/15/2015   Hyponatremia  10/14/2015   Thrombocytopenia ([place]) 06/16/2015   Right renal artery stenosis ([place]) 05/09/2015   Chronic diastolic heart failure ([place]) 04/03/2014   CKD (chronic kidney disease) stage 3, GFR 30-59 ml/min (HCC) 04/02/2014   Paroxysmal atrial fibrillation ([place]) 01/25/2014   Anemia 07/19/2013   Bilateral lower extremity edema 06/30/2013   Hypertension with fluid overload 06/30/2013   Varicose veins of lower extremities with other complications 14/78/2956   ABNORMAL STRESS ELECTROCARDIOGRAM 01/23/2010   Hyperlipidemia 12/18/2009   Essential hypertension 12/18/2009   RENAL INSUFFICIENCY, CHRONIC 12/18/2009   Arthropathy 12/18/2009   PCP:  Janet Norlander, DO Pharmacy:   [id number] - [place], [place] [address] Phone: [phone] Fax: [phone]     Social Determinants of Health (SDOH) Interventions    Readmission Risk Interventions Readmission Risk Prevention Plan 08/05/2021  Transportation Screening Complete  HRI or Home Care Consult Complete  Social Work Consult for [place] Planning/Counseling Complete  Palliative Care Screening Not Applicable  Medication Review Press photographer) Complete  Some recent data might be hidden

## 2021-08-05 NOTE — Plan of Care (Signed)

## 2021-08-05 NOTE — Consult Note (Signed)
[facility] Consultation Oncology  Name: [name]      MRN: [id number]    Location: A312/A312-01  Date: 08/05/2021 Time:6:40 PM   REFERRING PHYSICIAN: Dr. Manuella Ghazi  REASON FOR CONSULT: Breast cancer in the anemia from CKD    HISTORY OF PRESENT ILLNESS: [name] is a 87 year old very pleasant white female known to me from office visits for her stage I right breast cancer which was diagnosed in 2019.  She was started on letrozole around 05/28/2018.  She also has normocytic anemia from CKD and relative iron deficiency.  She presented to the hospital with complaints of generalized weakness, dry cough and difficulty breathing.  She was admitted with acute hypoxic respiratory failure secondary to bronchitis.  She is receiving IV steroids and breathing treatments.  COVID and influenza test were negative.  She has an appointment to see me in the office tomorrow.  She denies any bleeding per rectum or melena.  PAST MEDICAL HISTORY:   Past Medical History:  Diagnosis Date   Anemia    Arthritis    Asthma    Atrial fibrillation ([place])    Basal cell carcinoma 02/06/2009   Left ear targus- ([facility])   Basal cell carcinoma 09/28/2008   Right back-([id number])   CKD (chronic kidney disease), stage III (HCC)    Heart murmur    Hyperlipidemia    Hypertension    Hypothyroidism    Nodule of right lung    Right upper lobe   PONV (postoperative nausea and vomiting)    Renal insufficiency    Chronic   Renal vascular disease    Right renal artery stenosis ([place]) 05/09/2015   Squamous cell carcinoma of skin 09/07/2019   KA-Left shin-txpbx   Vertigo     ALLERGIES: Allergies  Allergen Reactions   Amlodipine Other (See Comments)    edema   Xeroform Occlusive Gauze Strip [Bismuth Tribromoph-Petrolatum]    Isosorbide Nitrate Nausea Only   Sulfonamide Derivatives Nausea Only and Rash   Terazosin Hives and Nausea Only      MEDICATIONS: I have reviewed the patient's current medications.      PAST SURGICAL HISTORY Past Surgical History:  Procedure Laterality Date   BIOPSY  04/09/2021   Procedure: BIOPSY;  Surgeon: Harvel Quale, MD;  [facility];  Service: Gastroenterology;;   BREAST LUMPECTOMY WITH RADIOACTIVE SEED LOCALIZATION Right 04/08/2018   Procedure: BREAST LUMPECTOMY WITH RADIOACTIVE SEED LOCALIZATION;  Surgeon: Rolm Bookbinder, MD;  Location: [facility];  Service: General;  Laterality: Right;   COLONOSCOPY  2016   diverticulosis, hemorrhoids   ESOPHAGOGASTRODUODENOSCOPY  2016   normal   ESOPHAGOGASTRODUODENOSCOPY (EGD) WITH PROPOFOL N/A 04/09/2021   Procedure: ESOPHAGOGASTRODUODENOSCOPY (EGD) WITH PROPOFOL;  Surgeon: Harvel Quale, MD;  [facility];  Service: Gastroenterology;  Laterality: N/A;  12:45   IR GENERIC HISTORICAL  08/29/2016   IR US GUIDE VASC ACCESS RIGHT 08/29/2016 [facility]   IR GENERIC HISTORICAL  08/29/2016   IR RENAL BILAT S&I MOD SED 08/29/2016 [facility]   KNEE ARTHROSCOPY Right    2019   THYROIDECTOMY, PARTIAL Right 1981   middle lobe removed 1st, then right lobe removed 7-8 years later (approx. 1988)    FAMILY HISTORY: Family History  Problem Relation Age of Onset   Thyroid disease Mother    Stroke Father    Hypertension Brother    Lung cancer Maternal Uncle    Cancer Maternal Grandmother        Liver  Lung cancer Maternal Uncle    Hypertension Daughter    Hypercholesterolemia Daughter    Hypercholesterolemia Daughter     SOCIAL HISTORY:  reports that she has never smoked. She has never used smokeless tobacco. She reports that she does not drink alcohol and does not use drugs.  PERFORMANCE STATUS: The patient's performance status is 3 - Symptomatic, >50% confined to bed  PHYSICAL EXAM: Most Recent Vital Signs: Blood pressure (!) 171/60, pulse 77, temperature 97.9 F (36.6 C), temperature source Oral, resp. rate 18, height '5\' 5"'  (1.651 m), weight 100 lb 5 oz (45.5 kg), SpO2 90  %. BP (!) 171/60 (BP Location: Left Arm)    Pulse 77    Temp 97.9 F (36.6 C) (Oral)    Resp 18    Ht '5\' 5"'  (1.651 m)    Wt 100 lb 5 oz (45.5 kg)    SpO2 90%    BMI 16.69 kg/m  General appearance: alert, cooperative, and appears stated age Lungs:  Bilateral air entry, clear lungs. Heart: regular rate and rhythm Abdomen:  Soft, nontender with no palpable masses. Extremities:  No edema or cyanosis. Lymph nodes:  No palpable adenopathy in the supraclavicular and axillary region. Right breast lumpectomy scar in the outer quadrant is well-healed.  No palpable masses in bilateral breast.  LABORATORY DATA:  Results for orders placed or performed during the hospital encounter of 08/01/21 (from the past 48 hour(s))  Basic metabolic panel     Status: Abnormal   Collection Time: 08/04/21  4:36 AM  Result Value Ref Range   Sodium 131 (L) 135 - 145 mmol/L   Potassium 4.3 3.5 - 5.1 mmol/L   Chloride 98 98 - 111 mmol/L   CO2 26 22 - 32 mmol/L   Glucose, Bld 156 (H) 70 - 99 mg/dL    Comment: Glucose reference range applies only to samples taken after fasting for at least 8 hours.   BUN 43 (H) 8 - 23 mg/dL   Creatinine, Ser 1.55 (H) 0.44 - 1.00 mg/dL   Calcium 8.2 (L) 8.9 - 10.3 mg/dL   GFR, Estimated 33 (L) >60 mL/min    Comment: (NOTE) Calculated using the CKD-EPI Creatinine Equation (2021)    Anion gap 7 5 - 15    Comment: Performed at [facility], [address] [place], [place] [address]  CBC     Status: Abnormal   Collection Time: 08/04/21  4:36 AM  Result Value Ref Range   WBC 13.5 (H) 4.0 - 10.5 K/uL   RBC 2.88 (L) 3.87 - 5.11 MIL/uL   Hemoglobin 8.0 (L) 12.0 - 15.0 g/dL   HCT 25.8 (L) 36.0 - 46.0 %   MCV 89.6 80.0 - 100.0 fL   MCH 27.8 26.0 - 34.0 pg   MCHC 31.0 30.0 - 36.0 g/dL   RDW 15.2 11.5 - 15.5 %   Platelets 140 (L) 150 - 400 K/uL   nRBC 0.0 0.0 - 0.2 %    Comment: Performed at [facility], [address] [place], [place] [address]  Magnesium     Status: None    Collection Time: 08/04/21  4:36 AM  Result Value Ref Range   Magnesium 1.9 1.7 - 2.4 mg/dL    Comment: Performed at [facility], [address] [place], [place] [address]  Magnesium     Status: None   Collection Time: 08/04/21 10:55 AM  Result Value Ref Range   Magnesium 1.9 1.7 - 2.4 mg/dL  Comment: Performed at [facility], [address] [place], [place] [address]  CBC     Status: Abnormal   Collection Time: 08/05/21  5:19 AM  Result Value Ref Range   WBC 10.9 (H) 4.0 - 10.5 K/uL   RBC 2.95 (L) 3.87 - 5.11 MIL/uL   Hemoglobin 8.7 (L) 12.0 - 15.0 g/dL   HCT 26.5 (L) 36.0 - 46.0 %   MCV 89.8 80.0 - 100.0 fL   MCH 29.5 26.0 - 34.0 pg   MCHC 32.8 30.0 - 36.0 g/dL   RDW 15.0 11.5 - 15.5 %   Platelets 136 (L) 150 - 400 K/uL   nRBC 0.0 0.0 - 0.2 %    Comment: Performed at [facility], [address] [place], [place] [address]  Basic metabolic panel     Status: Abnormal   Collection Time: 08/05/21  5:19 AM  Result Value Ref Range   Sodium 134 (L) 135 - 145 mmol/L   Potassium 4.1 3.5 - 5.1 mmol/L   Chloride 99 98 - 111 mmol/L   CO2 26 22 - 32 mmol/L   Glucose, Bld 161 (H) 70 - 99 mg/dL    Comment: Glucose reference range applies only to samples taken after fasting for at least 8 hours.   BUN 43 (H) 8 - 23 mg/dL   Creatinine, Ser 1.77 (H) 0.44 - 1.00 mg/dL   Calcium 8.4 (L) 8.9 - 10.3 mg/dL   GFR, Estimated 28 (L) >60 mL/min    Comment: (NOTE) Calculated using the CKD-EPI Creatinine Equation (2021)    Anion gap 9 5 - 15    Comment: Performed at [facility], [address], [place], [place] [address]  Procalcitonin - Baseline     Status: None   Collection Time: 08/05/21  5:19 AM  Result Value Ref Range   Procalcitonin <0.10 ng/mL    Comment:        Interpretation: PCT (Procalcitonin) <= 0.5 ng/mL: Systemic infection (sepsis) is not likely. Local bacterial infection is possible. (NOTE)       Sepsis PCT Algorithm           Lower Respiratory Tract                                       Infection PCT Algorithm    ----------------------------     ----------------------------         PCT < 0.25 ng/mL                PCT < 0.10 ng/mL          Strongly encourage             Strongly discourage   discontinuation of antibiotics    initiation of antibiotics    ----------------------------     -----------------------------       PCT 0.25 - 0.50 ng/mL            PCT 0.10 - 0.25 ng/mL               OR       >80% decrease in PCT            Discourage initiation of  antibiotics      Encourage discontinuation           of antibiotics    ----------------------------     -----------------------------         PCT >= 0.50 ng/mL              PCT 0.26 - 0.50 ng/mL               AND        <80% decrease in PCT             Encourage initiation of                                             antibiotics       Encourage continuation           of antibiotics    ----------------------------     -----------------------------        PCT >= 0.50 ng/mL                  PCT > 0.50 ng/mL               AND         increase in PCT                  Strongly encourage                                      initiation of antibiotics    Strongly encourage escalation           of antibiotics                                     -----------------------------                                           PCT <= 0.25 ng/mL                                                 OR                                        > 80% decrease in PCT                                      Discontinue / Do not initiate                                             antibiotics  Performed at [facility], [address], [place], [place] [address]       RADIOGRAPHY: No results found.      ASSESSMENT:  1.  Stage I right breast cancer: - Right lumpectomy on 04/08/2018, IDC, 1.1 cm, grade 1, margins negative, ER/PR positive, HER2 negative, Ki-67 1%. - Letrozole started on  05/28/2018.  She declined adjuvant radiation.  PLAN:  1.  Stage I right breast cancer: - Breast exam today did not reveal any palpable masses.  No palpable adenopathy. - Continue letrozole daily at bedtime. - Mammogram on 03/19/2021 at [facility] was benign.  2.  Bone health: - DEXA scan on 10/30/2014 with T score -0.7, normal range. - Continue calcium and vitamin D supplements.  3.  Normocytic anemia: - Anemia from CKD and relative iron deficiency. - She is taking iron tablet daily with stool softener. - Iron panel from 07/25/2021 shows ferritin 441.  Iron saturation was low at 16. - Recommend 1 infusion of Feraheme. - Hemoglobin today is 8.7. - She will follow-up with me in 2 months with repeat labs. - If there is no improvement in hemoglobin, will consider erythropoiesis stimulating agents.  All questions were answered. The patient knows to call the clinic with any problems, questions or concerns. We can certainly see the patient much sooner if necessary.    Derek Jack

## 2021-08-06 ENCOUNTER — Inpatient Hospital Stay (HOSPITAL_COMMUNITY): Payer: PPO

## 2021-08-06 ENCOUNTER — Inpatient Hospital Stay (HOSPITAL_COMMUNITY): Payer: PPO | Admitting: Hematology

## 2021-08-06 LAB — MAGNESIUM: Magnesium: 1.7 mg/dL (ref 1.7–2.4)

## 2021-08-06 LAB — CBC
HCT: 27 % — ABNORMAL LOW (ref 36.0–46.0)
Hemoglobin: 8.4 g/dL — ABNORMAL LOW (ref 12.0–15.0)
MCH: 27.9 pg (ref 26.0–34.0)
MCHC: 31.1 g/dL (ref 30.0–36.0)
MCV: 89.7 fL (ref 80.0–100.0)
Platelets: 110 10*3/uL — ABNORMAL LOW (ref 150–400)
RBC: 3.01 MIL/uL — ABNORMAL LOW (ref 3.87–5.11)
RDW: 15.2 % (ref 11.5–15.5)
WBC: 11.3 10*3/uL — ABNORMAL HIGH (ref 4.0–10.5)
nRBC: 0 % (ref 0.0–0.2)

## 2021-08-06 LAB — BASIC METABOLIC PANEL
Anion gap: 7 (ref 5–15)
BUN: 36 mg/dL — ABNORMAL HIGH (ref 8–23)
CO2: 27 mmol/L (ref 22–32)
Calcium: 8.2 mg/dL — ABNORMAL LOW (ref 8.9–10.3)
Chloride: 100 mmol/L (ref 98–111)
Creatinine, Ser: 1.52 mg/dL — ABNORMAL HIGH (ref 0.44–1.00)
GFR, Estimated: 33 mL/min — ABNORMAL LOW (ref >60)
Glucose, Bld: 152 mg/dL — ABNORMAL HIGH (ref 70–99)
Potassium: 4.3 mmol/L (ref 3.5–5.1)
Sodium: 134 mmol/L — ABNORMAL LOW (ref 135–145)

## 2021-08-06 MED ORDER — LISINOPRIL 10 MG PO TABS
20.0000 mg | ORAL_TABLET | Freq: Every day | ORAL | Status: DC
Start: 2021-08-06 — End: 2021-08-06

## 2021-08-06 MED ORDER — FAMOTIDINE 20 MG PO TABS
20.0000 mg | ORAL_TABLET | Freq: Every day | ORAL | Status: DC
  Administered 2021-08-07 – 2021-08-08 (×2): 20 mg via ORAL
  Filled 2021-08-06 (×2): qty 1

## 2021-08-06 MED ORDER — METHYLPREDNISOLONE SODIUM SUCC 40 MG IJ SOLR
40.0000 mg | Freq: Two times a day (BID) | INTRAMUSCULAR | Status: AC
  Administered 2021-08-06 – 2021-08-08 (×3): 40 mg via INTRAVENOUS
  Filled 2021-08-06 (×3): qty 1

## 2021-08-06 MED ORDER — LISINOPRIL 10 MG PO TABS
20.0000 mg | ORAL_TABLET | Freq: Every day | ORAL | Status: DC
  Administered 2021-08-06 – 2021-08-08 (×3): 20 mg via ORAL
  Filled 2021-08-06 (×3): qty 2

## 2021-08-06 NOTE — Progress Notes (Signed)
[facility]   Patient name: [name] Admit date: 08/01/2021  DOB: June 10, 1934 LOS: 4  MRN: [id number] Consult date: 08/05/2021  Referring provider: Dr. Manuella Ghazi, [facility] CC: Cough    History:  87 yo female presented to [facility] ER with weakness, dyspnea and dry cough.  SpO2 in ER down to 83% on room air.  Noted to have wheezing and started on bronchodilator therapy and supplemental oxygen in ER.  She also started on solumedrol.  She had persistent cough and wheezing, and PCCM asked to assess.  Past medical history:  CKD 3a, HTN, Diastolic CHF, A fib, Anemia, OA, Asthma, HLD, Hypothyroidism, Rt renal artery stenosis, Vertigo, Breast cancer, Esophageal candidiasis  Significant events:  12/15 Admit  Studies:    Micro:  COVID/Flu 12/15 >> negative  Lines:     Antibiotics:  Augmentin 12/19 >>   Consults:      Interim history:  Her breathing is better.  She feels like she can get more air into her lungs.  Still has cough and congestion, but feels better.  Slept better last night and feels more energy today.  Vital signs:  BP (!) 180/66    Pulse 85    Temp 98.3 F (36.8 C) (Oral)    Resp 19    Ht 5\' 5"  (1.651 m)    Wt 45.5 kg    SpO2 94%    BMI 16.69 kg/m   Intake/output:  I/O last 3 completed shifts: In: 82 [P.O.:1160; IV Piggyback:270] Out: -    Physical exam:   General - alert Eyes - pupils reactive ENT - no sinus tenderness, no stridor Cardiac - regular rate/rhythm, no murmur Chest - b/l rhonchi and faint wheeze, better air movement Abdomen - soft, non tender, + bowel sounds Extremities - no cyanosis, clubbing, or edema Skin - no rashes Neuro - normal strength, moves extremities, follows commands Psych - normal mood and behavior  Best practice:   DVT - SCDs SUP - Pepcid Nutrition - heart healthy   Assessment/plan:   Persistent cough and wheezing. - likely combination of acute asthmatic bronchitis,  rhinitis with post nasal drip, and possible reflux - change solumedrol to 40 mg q12h; if she continues to improve, then likely can transition to prednisone in next 48 hrs - continue yupelri, brovana, pulmicort - continue mucinex - day 2 of augmentin - continue pepcid - continue nasal irrigation, azelastine, flonase - prn tessalon, tussionex  Resolved hospital problems:    Goals of care/Family discussions:  Code status: DNR  Labs:   CMP Latest Ref Rng & Units 08/06/2021 08/05/2021 08/04/2021  Glucose 70 - 99 mg/dL 152(H) 161(H) 156(H)  BUN 8 - 23 mg/dL 36(H) 43(H) 43(H)  Creatinine 0.44 - 1.00 mg/dL 1.52(H) 1.77(H) 1.55(H)  Sodium 135 - 145 mmol/L 134(L) 134(L) 131(L)  Potassium 3.5 - 5.1 mmol/L 4.3 4.1 4.3  Chloride 98 - 111 mmol/L 100 99 98  CO2 22 - 32 mmol/L 27 26 26   Calcium 8.9 - 10.3 mg/dL 8.2(L) 8.4(L) 8.2(L)  Total Protein 6.5 - 8.1 g/dL - - -  Total Bilirubin 0.3 - 1.2 mg/dL - - -  Alkaline Phos 38 - 126 U/L - - -  AST 15 - 41 U/L - - -  ALT 0 - 44 U/L - - -    CBC Latest Ref Rng & Units 08/06/2021 08/05/2021 08/04/2021  WBC 4.0 - 10.5 K/uL 11.3(H) 10.9(H) 13.5(H)  Hemoglobin 12.0 - 15.0 g/dL 8.4(L) 8.7(L) 8.0(L)  Hematocrit 36.0 - 46.0 % 27.0(L) 26.5(L) 25.8(L)  Platelets 150 - 400 K/uL 110(L) 136(L) 140(L)    ABG    Component Value Date/Time   TCO2 33 (H) 04/09/2021 1213    CBG (last 3)  No results for input(s): GLUCAP in the last 72 hours.   Signature:  Chesley Mires, MD [place] Pager - [phone] 08/06/2021, 2:12 PM

## 2021-08-06 NOTE — Progress Notes (Signed)
PROGRESS NOTE    [name]  EUM:[id number] DOB: 08/17/1934 DOA: 08/01/2021 PCP: Janet Norlander, DO   Brief Narrative:   [name] is a 87 y.o. female with medical history significant for CKD 3, hypertension, diastolic CHF, atrial fibrillation. Patient presented to the ED with complaints of generalized weakness for 2 days, dry cough and difficulty breathing.  She was admitted with acute hypoxemic respiratory failure secondary to bronchitis and continues to have a significant cough with congestion.  She has been started on IV steroids and breathing treatments.  She continued to have persistent wheezing and cough and was seen by pulmonology 12/19 with changes to some of her medications which have allowed for further improvement.  She was also seen by oncology 12/19 and she is receiving iron infusions.  Assessment & Plan:   Principal Problem:   Acute respiratory failure with hypoxia (HCC) Active Problems:   Essential hypertension   Normocytic anemia   Paroxysmal atrial fibrillation (HCC)   CKD (chronic kidney disease) stage 3, GFR 30-59 ml/min (HCC)   Chronic diastolic heart failure (HCC)   Hyponatremia   Acute bronchitis   Malnutrition of moderate degree   History of right breast cancer   Acute hypoxemic respiratory failure secondary to bronchitis/rhinitis/postnasal drip/possible reflux -Procalcitonin low and COVID and influenza test negative -Continue Solu-Medrol as well as breathing treatments as prescribed -Continue Pulmicort, Candiss Norse per pulmonology -Tessalon perles 12/17 -Tussionex added 12/19 -Weaned off O2 -Augmentin started by pulmonology 12/19 -Nasal irrigation and Pepcid per pulmonology started 12/19 -Continue monitoring for improvement with anticipated discharge by a.m. if further improved   Hyponatremia-stable -Currently improving and likely from poor oral intake as well as use of chlorthalidone at home   Chronic anemia -Secondary to CKD and  relative iron deficiency -Resume home iron supplementation -Follows with Dr. Delton Coombes, who has consulted on patient 12/19 with IV iron infusion   Hypertension-elevated -Resume lisinopril 12/20, hold atenolol and chlorthalidone for now -IV hydralazine added for hypertension control   History of left atrial tachycardia/PAF -Flecainide and atenolol -Followed by Dr. Percival Spanish and is not on anticoagulation   CKD stage IIIb -Fluctuating creatinine levels, continue to monitor and repeat BMP   Chronic diastolic CHF -Currently stable and compensated -Echo 2019 with EF 60-65% and grade 2 diastolic dysfunction   Stage I breast cancer -Follows with Dr. Delton Coombes -Resume letrozole   DVT prophylaxis: Heparin Code Status: DNR Family Communication: Discussed with daughter on phone 12/20 Disposition Plan:  Status is: Inpatient   Remains inpatient appropriate because: Need for IV medications.      Consultants:  PCCM Oncology   Procedures:  See below   Antimicrobials:  None   Subjective: Patient seen and evaluated today with ongoing fits of coughing, but now with minimal wheezing.  She seems to think that she is starting to improve, but feels that she needs more time in the hospital.  Objective: Vitals:   08/05/21 2124 08/06/21 0443 08/06/21 0715 08/06/21 0835  BP: (!) 147/47 (!) 197/76  (!) 180/66  Pulse: 80 85    Resp: 20 19    Temp: 99 F (37.2 C) 98.3 F (36.8 C)    TempSrc: Oral Oral    SpO2: 93% 91% 94%   Weight:      Height:        Intake/Output Summary (Last 24 hours) at 08/06/2021 1054 Last data filed at 08/06/2021 0500 Gross per 24 hour  Intake 950 ml  Output --  Net 950 ml  Filed Weights   08/01/21 2200  Weight: 45.5 kg    Examination:  General exam: Appears calm and comfortable  Respiratory system: Minimal wheezing noted Cardiovascular system: S1 & S2 heard, RRR.  Gastrointestinal system: Abdomen is soft Central nervous system: Alert and  awake Extremities: No edema Skin: No significant lesions noted Psychiatry: Flat affect.    Data Reviewed: I have personally reviewed following labs and imaging studies  CBC: Recent Labs  Lab 08/01/21 1444 08/02/21 0525 08/03/21 0441 08/04/21 0436 08/05/21 0519 08/06/21 0514  WBC 4.6 3.0* 10.0 13.5* 10.9* 11.3*  NEUTROABS 3.9  --   --   --   --   --   HGB 9.2* 8.5* 8.2* 8.0* 8.7* 8.4*  HCT 27.5* 26.9* 25.9* 25.8* 26.5* 27.0*  MCV 87.6 88.5 89.9 89.6 89.8 89.7  PLT 149* 125* 131* 140* 136* 921*   Basic Metabolic Panel: Recent Labs  Lab 08/01/21 1444 08/02/21 0525 08/03/21 0441 08/04/21 0436 08/04/21 1055 08/05/21 0519 08/06/21 0514  NA 126* 130* 129* 131*  --  134* 134*  K 3.4* 4.3 4.3 4.3  --  4.1 4.3  CL 88* 95* 95* 98  --  99 100  CO2 27 24 25 26   --  26 27  GLUCOSE 128* 172* 168* 156*  --  161* 152*  BUN 30* 35* 43* 43*  --  43* 36*  CREATININE 1.59* 1.83* 1.80* 1.55*  --  1.77* 1.52*  CALCIUM 8.7* 8.5* 8.3* 8.2*  --  8.4* 8.2*  MG 2.0  --  1.9 1.9 1.9  --  1.7   GFR: Estimated Creatinine Clearance: 19.4 mL/min (A) (by C-G formula based on SCr of 1.52 mg/dL (H)). Liver Function Tests: Recent Labs  Lab 08/01/21 1444  AST 26  ALT 16  ALKPHOS 59  BILITOT 0.7  PROT 7.9  ALBUMIN 3.8   No results for input(s): LIPASE, AMYLASE in the last 168 hours. No results for input(s): AMMONIA in the last 168 hours. Coagulation Profile: No results for input(s): INR, PROTIME in the last 168 hours. Cardiac Enzymes: No results for input(s): CKTOTAL, CKMB, CKMBINDEX, TROPONINI in the last 168 hours. BNP (last 3 results) No results for input(s): PROBNP in the last 8760 hours. HbA1C: No results for input(s): HGBA1C in the last 72 hours. CBG: No results for input(s): GLUCAP in the last 168 hours. Lipid Profile: No results for input(s): CHOL, HDL, LDLCALC, TRIG, CHOLHDL, LDLDIRECT in the last 72 hours. Thyroid Function Tests: No results for input(s): TSH, T4TOTAL,  FREET4, T3FREE, THYROIDAB in the last 72 hours. Anemia Panel: No results for input(s): VITAMINB12, FOLATE, FERRITIN, TIBC, IRON, RETICCTPCT in the last 72 hours. Sepsis Labs: Recent Labs  Lab 08/02/21 0525 08/05/21 0519  PROCALCITON <0.10 <0.10    Recent Results (from the past 240 hour(s))  Resp Panel by RT-PCR (Flu A&B, Covid) Nasopharyngeal Swab     Status: None   Collection Time: 08/01/21  2:44 PM   Specimen: Nasopharyngeal Swab; Nasopharyngeal(NP) swabs in vial transport medium  Result Value Ref Range Status   SARS Coronavirus 2 by RT PCR NEGATIVE NEGATIVE Final    Comment: (NOTE) SARS-CoV-2 target nucleic acids are NOT DETECTED.  The SARS-CoV-2 RNA is generally detectable in upper respiratory specimens during the acute phase of infection. The lowest concentration of SARS-CoV-2 viral copies this assay can detect is 138 copies/mL. A negative result does not preclude SARS-Cov-2 infection and should not be used as the sole basis for treatment or other patient management decisions.  A negative result may occur with  improper specimen collection/handling, submission of specimen other than nasopharyngeal swab, presence of viral mutation(s) within the areas targeted by this assay, and inadequate number of viral copies(<138 copies/mL). A negative result must be combined with clinical observations, patient history, and epidemiological information. The expected result is Negative.  Fact Sheet for Patients:  EntrepreneurPulse.com.au  Fact Sheet for Healthcare Providers:  IncredibleEmployment.be  This test is no t yet approved or cleared by the [place] FDA and  has been authorized for detection and/or diagnosis of SARS-CoV-2 by FDA under an Emergency Use Authorization (EUA). This EUA will remain  in effect (meaning this test can be used) for the duration of the COVID-19 declaration under Section 564(b)(1) of the Act, 21 U.S.C.section  360bbb-3(b)(1), unless the authorization is terminated  or revoked sooner.       Influenza A by PCR NEGATIVE NEGATIVE Final   Influenza B by PCR NEGATIVE NEGATIVE Final    Comment: (NOTE) The Xpert Xpress SARS-CoV-2/FLU/RSV plus assay is intended as an aid in the diagnosis of influenza from Nasopharyngeal swab specimens and should not be used as a sole basis for treatment. Nasal washings and aspirates are unacceptable for Xpert Xpress SARS-CoV-2/FLU/RSV testing.  Fact Sheet for Patients: EntrepreneurPulse.com.au  Fact Sheet for Healthcare Providers: IncredibleEmployment.be  This test is not yet approved or cleared by the [place] FDA and has been authorized for detection and/or diagnosis of SARS-CoV-2 by FDA under an Emergency Use Authorization (EUA). This EUA will remain in effect (meaning this test can be used) for the duration of the COVID-19 declaration under Section 564(b)(1) of the Act, 21 U.S.C. section 360bbb-3(b)(1), unless the authorization is terminated or revoked.  Performed at [facility], [address], [place], [place] [address]          Radiology Studies: No results found.      Scheduled Meds:  amoxicillin-clavulanate  500 mg of amoxicillin Oral Q12H   arformoterol  15 mcg Nebulization BID   azelastine  1 spray Each Nare BID   benzonatate  100 mg Oral TID   budesonide (PULMICORT) nebulizer solution  0.5 mg Nebulization BID   chlorpheniramine-HYDROcodone  5 mL Oral Q12H   famotidine  20 mg Oral BID   feeding supplement  237 mL Oral BID BM   ferrous sulfate  325 mg Oral BID WC   flecainide  50 mg Oral BID   fluticasone  1 spray Each Nare BID   guaiFENesin  1,200 mg Oral BID   hydrALAZINE  100 mg Oral BID   letrozole  2.5 mg Oral Daily   levothyroxine  75 mcg Oral Q0600   lisinopril  20 mg Oral Daily   methylPREDNISolone (SOLU-MEDROL) injection  60 mg Intravenous Q12H   mirtazapine  7.5 mg Oral QHS    multivitamin with minerals  1 tablet Oral Daily   polyvinyl alcohol  1 drop Both Eyes Daily   revefenacin  175 mcg Nebulization Daily   sodium chloride  2 spray Each Nare BID   Continuous Infusions:  ferric gluconate (FERRLECIT) IVPB Stopped (08/05/21 2315)     LOS: 4 days    Time spent: 35 minutes    Sharissa Brierley Darleen Crocker, DO [facility]  If 7PM-7AM, please contact night-coverage [email or web address] 08/06/2021, 10:54 AM

## 2021-08-07 DIAGNOSIS — N1831 Chronic kidney disease, stage 3a: Secondary | ICD-10-CM

## 2021-08-07 DIAGNOSIS — I10 Essential (primary) hypertension: Secondary | ICD-10-CM

## 2021-08-07 LAB — BASIC METABOLIC PANEL
Anion gap: 9 (ref 5–15)
BUN: 42 mg/dL — ABNORMAL HIGH (ref 8–23)
CO2: 28 mmol/L (ref 22–32)
Calcium: 8.2 mg/dL — ABNORMAL LOW (ref 8.9–10.3)
Chloride: 97 mmol/L — ABNORMAL LOW (ref 98–111)
Creatinine, Ser: 1.5 mg/dL — ABNORMAL HIGH (ref 0.44–1.00)
GFR, Estimated: 34 mL/min — ABNORMAL LOW (ref >60)
Glucose, Bld: 163 mg/dL — ABNORMAL HIGH (ref 70–99)
Potassium: 4.4 mmol/L (ref 3.5–5.1)
Sodium: 134 mmol/L — ABNORMAL LOW (ref 135–145)

## 2021-08-07 LAB — CBC
HCT: 25.7 % — ABNORMAL LOW (ref 36.0–46.0)
Hemoglobin: 8 g/dL — ABNORMAL LOW (ref 12.0–15.0)
MCH: 28 pg (ref 26.0–34.0)
MCHC: 31.1 g/dL (ref 30.0–36.0)
MCV: 89.9 fL (ref 80.0–100.0)
Platelets: 106 10*3/uL — ABNORMAL LOW (ref 150–400)
RBC: 2.86 MIL/uL — ABNORMAL LOW (ref 3.87–5.11)
RDW: 15.1 % (ref 11.5–15.5)
WBC: 12 10*3/uL — ABNORMAL HIGH (ref 4.0–10.5)
nRBC: 0 % (ref 0.0–0.2)

## 2021-08-07 LAB — MAGNESIUM: Magnesium: 1.9 mg/dL (ref 1.7–2.4)

## 2021-08-07 MED ORDER — BISOPROLOL FUMARATE 5 MG PO TABS
5.0000 mg | ORAL_TABLET | Freq: Every day | ORAL | Status: DC
  Administered 2021-08-07 – 2021-08-08 (×2): 5 mg via ORAL
  Filled 2021-08-07 (×2): qty 1

## 2021-08-07 MED ORDER — PREDNISONE 20 MG PO TABS
30.0000 mg | ORAL_TABLET | Freq: Every day | ORAL | Status: DC
  Administered 2021-08-08: 08:00:00 30 mg via ORAL
  Filled 2021-08-07: qty 2

## 2021-08-07 NOTE — Progress Notes (Signed)
[facility]   Patient name: [name] Admit date: 08/01/2021  DOB: [birth year] LOS: 5  MRN: [id number] Consult date: 08/05/2021  Referring provider: Dr. Manuella Ghazi, [facility] CC: Cough    History:  87 yo female presented to [facility] ER with weakness, dyspnea and dry cough.  SpO2 in ER down to 83% on room air.  Noted to have wheezing and started on bronchodilator therapy and supplemental oxygen in ER.  She also started on solumedrol.  She had persistent cough and wheezing, and PCCM asked to assess.  Past medical history:  CKD 3a, HTN, Diastolic CHF, A fib, Anemia, OA, Asthma, HLD, Hypothyroidism, Rt renal artery stenosis, Vertigo, Breast cancer, Esophageal candidiasis  Studies:    Micro:  COVID/Flu 12/15 >> negative  Lines:     Antibiotics:  Augmentin 12/19 >>   Consults:      Interim history:  She felt better this morning.  Did some walking in the hall later and then had more coughing spells.  This made her feels sore and tired.  Better once she has some time to sit and rest.  Vital signs:  BP (!) 162/68 (BP Location: Left Arm)    Pulse 88    Temp 98.3 F (36.8 C) (Oral)    Resp 18    Ht 5\' 5"  (1.651 m)    Wt 45.5 kg    SpO2 95%    BMI 16.69 kg/m   Intake/output:  I/O last 3 completed shifts: In: 70 [P.O.:1120; IV Piggyback:270] Out: -    Physical exam:   General - alert Eyes - pupils reactive ENT - no sinus tenderness, no stridor Cardiac - regular rate/rhythm, no murmur Chest - scattered rhonchi, no wheeze, breath sounds better Abdomen - soft, non tender, + bowel sounds Extremities - no cyanosis, clubbing, or edema Skin - no rashes Neuro - normal strength, moves extremities, follows commands Psych - normal mood and behavior  Best practice:   DVT - SCDs SUP - Pepcid Nutrition - heart healthy   Assessment/plan:   Persistent cough and wheezing. - likely combination of acute asthmatic bronchitis, rhinitis  with post nasal drip, and possible reflux - slowly improving - change to prednisone 30 mg starting in AM of 12/22 and wean off over the course of one week - continue yupelri, brovana, pulmicort - day 3 of 5 for augmentin - continue mucinex - continue pepcid 20 mg daily - continue nasal irrigation, azelastine, flonase, tussionex - prn tessalon - bronchial hygiene - mobilize as able  If she continues to improve, then I suspect she should be ready for d/c home from pulmonary standpoint in the next 24 to 48 hrs.  Resolved hospital problems:    Goals of care/Family discussions:  Code status: DNR  Labs:   CMP Latest Ref Rng & Units 08/07/2021 08/06/2021 08/05/2021  Glucose 70 - 99 mg/dL 163(H) 152(H) 161(H)  BUN 8 - 23 mg/dL 42(H) 36(H) 43(H)  Creatinine 0.44 - 1.00 mg/dL 1.50(H) 1.52(H) 1.77(H)  Sodium 135 - 145 mmol/L 134(L) 134(L) 134(L)  Potassium 3.5 - 5.1 mmol/L 4.4 4.3 4.1  Chloride 98 - 111 mmol/L 97(L) 100 99  CO2 22 - 32 mmol/L 28 27 26   Calcium 8.9 - 10.3 mg/dL 8.2(L) 8.2(L) 8.4(L)  Total Protein 6.5 - 8.1 g/dL - - -  Total Bilirubin 0.3 - 1.2 mg/dL - - -  Alkaline Phos 38 - 126 U/L - - -  AST 15 - 41 U/L - - -  ALT 0 - 44 U/L - - -    CBC Latest Ref Rng & Units 08/07/2021 08/06/2021 08/05/2021  WBC 4.0 - 10.5 K/uL 12.0(H) 11.3(H) 10.9(H)  Hemoglobin 12.0 - 15.0 g/dL 8.0(L) 8.4(L) 8.7(L)  Hematocrit 36.0 - 46.0 % 25.7(L) 27.0(L) 26.5(L)  Platelets 150 - 400 K/uL 106(L) 110(L) 136(L)    Signature:  Chesley Mires, MD [place] Pager - [phone] 08/07/2021, 3:02 PM

## 2021-08-07 NOTE — Discharge Instructions (Signed)
[facility] Stay Proper nutrition can help your body recover from illness and injury.   Foods and beverages high in protein, vitamins, and minerals help rebuild muscle loss, promote healing, & reduce fall risk.   In addition to eating healthy foods, a nutrition shake is an easy, delicious way to get the nutrition you need during and after your hospital stay  It is recommended that you continue to drink 2 bottles per day of:  Oral nutrition shakes for at least 1 month (30 days) after your hospital stay   Tips for adding a nutrition shake into your routine: As allowed, drink one with vitamins or medications instead of water or juice Enjoy one as a tasty mid-morning or afternoon snack Drink cold or make a milkshake out of it Drink one instead of milk with cereal or snacks Use as a coffee creamer   Available at the following grocery stores and pharmacies:           * [place] [phone]            For COUPONS visit: www.ensure.com/join or [email or web address]-[email or web address]   Suggested Substitutions Ensure Plus = Boost Plus = Carnation Breakfast Essentials = Boost Compact Ensure Active Clear = Boost Breeze Glucerna Shake = Boost Glucose Control = Carnation Breakfast Essentials SUGAR FREE

## 2021-08-07 NOTE — Progress Notes (Signed)
Nutrition Follow-up  DOCUMENTATION CODES:   Non-severe (moderate) malnutrition in context of chronic illness  INTERVENTION:  Ensure Plus -Strawberry flavor- BID  Recommend pt continue Oral nutrition supplements BID daily for at least 1 month after discharge  NUTRITION DIAGNOSIS:   Moderate Malnutrition related to chronic illness as evidenced by per patient/family report, energy intake < 75% for > or equal to 1 month, mild fat depletion, moderate fat depletion, mild muscle depletion, moderate muscle depletion.  -pt meal intake improving  GOAL:   Patient will meet greater than or equal to 90% of their needs  -ongoing  MONITOR:   PO intake, Supplement acceptance, Labs, Skin, Weight trends   ASSESSMENT:   Patient is an underweight 87 yo female with hx of CKD-3, Anemia, CHF, and HTN. Presents with generalized weakness. Hyponatremia.  Patient is up in chair eating lunch. Her meal intake has been very good 75-100%. She feeds herself. She also likes the Ensure (strawberry). We talked about her continuing the ONS after discharge to help replete her nutritional status.    Medications and weights reviewed.   Labs: BMP Latest Ref Rng & Units 08/07/2021 08/06/2021 08/05/2021  Glucose 70 - 99 mg/dL 163(H) 152(H) 161(H)  BUN 8 - 23 mg/dL 42(H) 36(H) 43(H)  Creatinine 0.44 - 1.00 mg/dL 1.50(H) 1.52(H) 1.77(H)  BUN/Creat Ratio 12 - 28 - - -  Sodium 135 - 145 mmol/L 134(L) 134(L) 134(L)  Potassium 3.5 - 5.1 mmol/L 4.4 4.3 4.1  Chloride 98 - 111 mmol/L 97(L) 100 99  CO2 22 - 32 mmol/L 28 27 26   Calcium 8.9 - 10.3 mg/dL 8.2(L) 8.2(L) 8.4(L)    Diet Order:   Diet Order             Diet Heart Room service appropriate? Yes; Fluid consistency: Thin  Diet effective now                   EDUCATION NEEDS:   Education needs have been addressed  Skin:  Skin Assessment: Reviewed RN Assessment  Last BM:  12/20  Height:   Ht Readings from Last 1 Encounters:  08/01/21 5\' 5"   (1.651 m)    Weight:   Wt Readings from Last 1 Encounters:  08/01/21 45.5 kg    Ideal Body Weight:   57 kg  BMI:  Body mass index is 16.69 kg/m.  Estimated Nutritional Needs:   Kcal:  0938-1829  Protein:  60-65 gr  Fluid:  >1200 ml daily   Colman Cater MS,RD,CSG,LDN Contact: Shea Evans

## 2021-08-07 NOTE — Progress Notes (Signed)
SATURATION QUALIFICATIONS: (This note is used to comply with regulatory documentation for home oxygen)  Patient Saturations on Room Air at Rest = 98%  Patient Saturations on Room Air while Ambulating = 95%   

## 2021-08-07 NOTE — Plan of Care (Signed)

## 2021-08-07 NOTE — Progress Notes (Signed)
°  Transition of Care [facility]) Screening Note   Patient Details  Name: [name] Date of Birth: Jun 09, 1934   Transition of Care [facility]) CM/SW Contact:    Ihor Gully, LCSW Phone Number: 08/07/2021, 2:52 PM    Transition of Care Department [facility]) has reviewed patient and no TOC needs have been identified at this time. We will continue to monitor patient advancement through interdisciplinary progression rounds. If new patient transition needs arise, please place a TOC consult.

## 2021-08-07 NOTE — Progress Notes (Signed)
PROGRESS NOTE    [name]  VOJ:[id number] DOB: [birth year] DOA: 08/01/2021 PCP: [name], DO   Brief Narrative:   [name] is a 87 y.o. female with medical history significant for CKD 3, hypertension, diastolic CHF, atrial fibrillation. Patient presented to the ED with complaints of generalized weakness for 2 days, dry cough and difficulty breathing.  She was admitted with acute hypoxemic respiratory failure secondary to bronchitis and continues to have a significant cough with congestion.  She has been started on IV steroids and breathing treatments.  She continued to have persistent wheezing and cough and was seen by pulmonology 12/19 with changes to some of her medications which have allowed for further improvement.  She was also seen by oncology 12/19 and she is receiving iron infusions.  Assessment & Plan:   Principal Problem:   Acute respiratory failure with hypoxia (HCC) Active Problems:   Essential hypertension   Normocytic anemia   Paroxysmal atrial fibrillation (HCC)   CKD (chronic kidney disease) stage 3, GFR 30-59 ml/min (HCC)   Chronic diastolic heart failure (HCC)   Hyponatremia   Acute bronchitis   Malnutrition of moderate degree   History of right breast cancer   Acute hypoxemic respiratory failure secondary to bronchitis/rhinitis/postnasal drip/possible reflux -Procalcitonin low and COVID and influenza test negative -Continue Solu-Medrol as well as breathing treatments as prescribed, will likely transition to prednisone in a.m. -Continue Pulmicort, Candiss Norse per pulmonology -Tessalon perles 12/17 -Tussionex added 12/19 -Weaned off O2 -Augmentin started by pulmonology 12/19 -Nasal irrigation and Pepcid per pulmonology started 12/19 -Continue monitoring for improvement with anticipated discharge by a.m. if further improved   Hyponatremia-stable -Currently improving and likely from poor oral intake as well as use of chlorthalidone at  home   Chronic anemia -Secondary to CKD and relative iron deficiency -Resume home iron supplementation -Follows with Dr. Delton Coombes, who has consulted on patient 12/19 with IV iron infusion -Can consider ESA as an outpatient if hemoglobin does not significantly improve.   Hypertension-elevated -Resumed lisinopril 12/20 -Blood pressure still remain elevated -She is chronically on atenolol, will substitute for bisoprolol due to selective beta adrenergic activity. -IV hydralazine added for hypertension control   History of left atrial tachycardia/PAF -Flecainide and atenolol -Followed by Dr. Percival Spanish and is not on anticoagulation   CKD stage IIIb -Fluctuating creatinine levels, continue to monitor and repeat BMP   Chronic diastolic CHF -Currently stable and compensated -Echo 2019 with EF 60-65% and grade 2 diastolic dysfunction   Stage I breast cancer -Follows with Dr. Delton Coombes -Resume letrozole   DVT prophylaxis: Heparin Code Status: DNR Family Communication: Discussed with daughter on phone 12/20 Disposition Plan:  Status is: Inpatient   Remains inpatient appropriate because: Need for IV medications.      Consultants:  PCCM Oncology   Procedures:  See below   Antimicrobials:  None   Subjective: Overall shortness of breath has improved.  She does continue to have productive cough and wheezing  Objective: Vitals:   08/07/21 0722 08/07/21 1300 08/07/21 1941 08/07/21 1945  BP:  (!) 162/68    Pulse:  88    Resp:  18    Temp:  98.3 F (36.8 C)    TempSrc:  Oral    SpO2: 95% 95% 96% 96%  Weight:      Height:        Intake/Output Summary (Last 24 hours) at 08/07/2021 1955 Last data filed at 08/07/2021 1300 Gross per 24 hour  Intake 560  ml  Output --  Net 560 ml   Filed Weights   08/01/21 2200  Weight: 45.5 kg    Examination:  General exam: Alert, awake, oriented x 3 Respiratory system: Mild wheeze bilaterally. Respiratory effort  normal. Cardiovascular system:RRR. No murmurs, rubs, gallops. Gastrointestinal system: Abdomen is nondistended, soft and nontender. No organomegaly or masses felt. Normal bowel sounds heard. Central nervous system: Alert and oriented. No focal neurological deficits. Extremities: No C/C/E, +pedal pulses Skin: No rashes, lesions or ulcers Psychiatry: Judgement and insight appear normal. Mood & affect appropriate.      Data Reviewed: I have personally reviewed following labs and imaging studies  CBC: Recent Labs  Lab 08/01/21 1444 08/02/21 0525 08/03/21 0441 08/04/21 0436 08/05/21 0519 08/06/21 0514 08/07/21 0459  WBC 4.6   < > 10.0 13.5* 10.9* 11.3* 12.0*  NEUTROABS 3.9  --   --   --   --   --   --   HGB 9.2*   < > 8.2* 8.0* 8.7* 8.4* 8.0*  HCT 27.5*   < > 25.9* 25.8* 26.5* 27.0* 25.7*  MCV 87.6   < > 89.9 89.6 89.8 89.7 89.9  PLT 149*   < > 131* 140* 136* 110* 106*   < > = values in this interval not displayed.   Basic Metabolic Panel: Recent Labs  Lab 08/03/21 0441 08/04/21 0436 08/04/21 1055 08/05/21 0519 08/06/21 0514 08/07/21 0459  NA 129* 131*  --  134* 134* 134*  K 4.3 4.3  --  4.1 4.3 4.4  CL 95* 98  --  99 100 97*  CO2 25 26  --  26 27 28   GLUCOSE 168* 156*  --  161* 152* 163*  BUN 43* 43*  --  43* 36* 42*  CREATININE 1.80* 1.55*  --  1.77* 1.52* 1.50*  CALCIUM 8.3* 8.2*  --  8.4* 8.2* 8.2*  MG 1.9 1.9 1.9  --  1.7 1.9   GFR: Estimated Creatinine Clearance: 19.7 mL/min (A) (by C-G formula based on SCr of 1.5 mg/dL (H)). Liver Function Tests: Recent Labs  Lab 08/01/21 1444  AST 26  ALT 16  ALKPHOS 59  BILITOT 0.7  PROT 7.9  ALBUMIN 3.8   No results for input(s): LIPASE, AMYLASE in the last 168 hours. No results for input(s): AMMONIA in the last 168 hours. Coagulation Profile: No results for input(s): INR, PROTIME in the last 168 hours. Cardiac Enzymes: No results for input(s): CKTOTAL, CKMB, CKMBINDEX, TROPONINI in the last 168 hours. BNP  (last 3 results) No results for input(s): PROBNP in the last 8760 hours. HbA1C: No results for input(s): HGBA1C in the last 72 hours. CBG: No results for input(s): GLUCAP in the last 168 hours. Lipid Profile: No results for input(s): CHOL, HDL, LDLCALC, TRIG, CHOLHDL, LDLDIRECT in the last 72 hours. Thyroid Function Tests: No results for input(s): TSH, T4TOTAL, FREET4, T3FREE, THYROIDAB in the last 72 hours. Anemia Panel: No results for input(s): VITAMINB12, FOLATE, FERRITIN, TIBC, IRON, RETICCTPCT in the last 72 hours. Sepsis Labs: Recent Labs  Lab 08/02/21 0525 08/05/21 0519  PROCALCITON <0.10 <0.10    Recent Results (from the past 240 hour(s))  Resp Panel by RT-PCR (Flu A&B, Covid) Nasopharyngeal Swab     Status: None   Collection Time: 08/01/21  2:44 PM   Specimen: Nasopharyngeal Swab; Nasopharyngeal(NP) swabs in vial transport medium  Result Value Ref Range Status   SARS Coronavirus 2 by RT PCR NEGATIVE NEGATIVE Final    Comment: (NOTE)  SARS-CoV-2 target nucleic acids are NOT DETECTED.  The SARS-CoV-2 RNA is generally detectable in upper respiratory specimens during the acute phase of infection. The lowest concentration of SARS-CoV-2 viral copies this assay can detect is 138 copies/mL. A negative result does not preclude SARS-Cov-2 infection and should not be used as the sole basis for treatment or other patient management decisions. A negative result may occur with  improper specimen collection/handling, submission of specimen other than nasopharyngeal swab, presence of viral mutation(s) within the areas targeted by this assay, and inadequate number of viral copies(<138 copies/mL). A negative result must be combined with clinical observations, patient history, and epidemiological information. The expected result is Negative.  Fact Sheet for Patients:  EntrepreneurPulse.com.au  Fact Sheet for Healthcare Providers:   IncredibleEmployment.be  This test is no t yet approved or cleared by the [place] FDA and  has been authorized for detection and/or diagnosis of SARS-CoV-2 by FDA under an Emergency Use Authorization (EUA). This EUA will remain  in effect (meaning this test can be used) for the duration of the COVID-19 declaration under Section 564(b)(1) of the Act, 21 U.S.C.section 360bbb-3(b)(1), unless the authorization is terminated  or revoked sooner.       Influenza A by PCR NEGATIVE NEGATIVE Final   Influenza B by PCR NEGATIVE NEGATIVE Final    Comment: (NOTE) The Xpert Xpress SARS-CoV-2/FLU/RSV plus assay is intended as an aid in the diagnosis of influenza from Nasopharyngeal swab specimens and should not be used as a sole basis for treatment. Nasal washings and aspirates are unacceptable for Xpert Xpress SARS-CoV-2/FLU/RSV testing.  Fact Sheet for Patients: EntrepreneurPulse.com.au  Fact Sheet for Healthcare Providers: IncredibleEmployment.be  This test is not yet approved or cleared by the [place] FDA and has been authorized for detection and/or diagnosis of SARS-CoV-2 by FDA under an Emergency Use Authorization (EUA). This EUA will remain in effect (meaning this test can be used) for the duration of the COVID-19 declaration under Section 564(b)(1) of the Act, 21 U.S.C. section 360bbb-3(b)(1), unless the authorization is terminated or revoked.  Performed at [facility], [address], [place],  [address]          Radiology Studies: No results found.      Scheduled Meds:  amoxicillin-clavulanate  500 mg of amoxicillin Oral Q12H   arformoterol  15 mcg Nebulization BID   azelastine  1 spray Each Nare BID   benzonatate  100 mg Oral TID   budesonide (PULMICORT) nebulizer solution  0.5 mg Nebulization BID   chlorpheniramine-HYDROcodone  5 mL Oral Q12H   famotidine  20 mg Oral Daily   feeding  supplement  237 mL Oral BID BM   ferrous sulfate  325 mg Oral BID WC   flecainide  50 mg Oral BID   fluticasone  1 spray Each Nare BID   guaiFENesin  1,200 mg Oral BID   hydrALAZINE  100 mg Oral BID   letrozole  2.5 mg Oral Daily   levothyroxine  75 mcg Oral Q0600   lisinopril  20 mg Oral Daily   methylPREDNISolone (SOLU-MEDROL) injection  40 mg Intravenous Q12H   mirtazapine  7.5 mg Oral QHS   multivitamin with minerals  1 tablet Oral Daily   polyvinyl alcohol  1 drop Both Eyes Daily   [START ON 08/08/2021] predniSONE  30 mg Oral Q breakfast   revefenacin  175 mcg Nebulization Daily   sodium chloride  2 spray Each Nare BID   Continuous Infusions:  LOS: 5 days    Time spent: 35 minutes    Kathie Dike, MD [facility]  If 7PM-7AM, please contact night-coverage [email or web address] 08/07/2021, 7:55 PM

## 2021-08-08 MED ORDER — ALBUTEROL SULFATE (2.5 MG/3ML) 0.083% IN NEBU: 2.5000 mg | INHALATION_SOLUTION | RESPIRATORY_TRACT | 12 refills | Status: DC | PRN

## 2021-08-08 MED ORDER — MOMETASONE FURO-FORMOTEROL FUM 200-5 MCG/ACT IN AERO
2.0000 | INHALATION_SPRAY | Freq: Two times a day (BID) | RESPIRATORY_TRACT | Status: DC
  Administered 2021-08-08: 13:00:00 2 via RESPIRATORY_TRACT
  Filled 2021-08-08: qty 8.8

## 2021-08-08 MED ORDER — PREDNISONE 10 MG PO TABS: ORAL_TABLET | ORAL | 0 refills | Status: DC

## 2021-08-08 MED ORDER — BISOPROLOL FUMARATE 5 MG PO TABS: 5.0000 mg | ORAL_TABLET | Freq: Every day | ORAL | 0 refills | Status: DC

## 2021-08-08 MED ORDER — GUAIFENESIN ER 600 MG PO TB12: 600.0000 mg | ORAL_TABLET | Freq: Two times a day (BID) | ORAL | 0 refills | Status: DC

## 2021-08-08 MED ORDER — LISINOPRIL 20 MG PO TABS
20.0000 mg | ORAL_TABLET | Freq: Every day | ORAL | Status: DC
Start: 2021-08-08 — End: 2021-09-02

## 2021-08-08 MED ORDER — BUDESONIDE-FORMOTEROL FUMARATE 160-4.5 MCG/ACT IN AERO
2.0000 | INHALATION_SPRAY | Freq: Two times a day (BID) | RESPIRATORY_TRACT | 12 refills | Status: DC
Start: 2021-08-08 — End: 2021-09-25

## 2021-08-08 MED ORDER — HYDROCOD POLST-CPM POLST ER 10-8 MG/5ML PO SUER: 5.0000 mL | Freq: Two times a day (BID) | ORAL | 0 refills | Status: DC

## 2021-08-08 MED ORDER — PROCARE SPACER/ADULT MASK DEVI: 0 refills | Status: DC

## 2021-08-08 MED ORDER — AMOXICILLIN-POT CLAVULANATE 500-125 MG PO TABS: 500.0000 mg | ORAL_TABLET | Freq: Two times a day (BID) | ORAL | 0 refills | Status: DC

## 2021-08-08 MED ORDER — ALBUTEROL SULFATE HFA 108 (90 BASE) MCG/ACT IN AERS: 2.0000 | INHALATION_SPRAY | Freq: Four times a day (QID) | RESPIRATORY_TRACT | 2 refills | Status: DC | PRN

## 2021-08-08 NOTE — Care Management Important Message (Signed)
Important Message  Patient Details  Name: [name] MRN: [id number] Date of Birth: [birth year]   Medicare Important Message Given:  Yes     Tommy Medal 08/08/2021, 11:34 AM

## 2021-08-08 NOTE — TOC Transition Note (Signed)
Transition of Care [facility]) - CM/SW Discharge Note   Patient Details  Name: [name] MRN: [id number] Date of Birth: [birth year]  [facility] CM/SW Contact:  Ihor Gully, LCSW Phone Number: 08/08/2021, 12:21 PM   Clinical Narrative:    Attending ordered nebulizer for patient. Discussed with patient DME options. Referral made to [employer]. Nebulizer will be delivered to room.     Barriers to Discharge: Continued Medical Work up   Patient Goals and CMS Choice Patient states their goals for this hospitalization and ongoing recovery are:: return home   Choice offered to / list presented to : Patient  Discharge Placement                       Discharge Plan and Services In-house Referral: Clinical Social Work              DME Arranged: [facility] DME Agency: [employer] Date DME Agency Contacted: 08/08/21 Time DME Agency Contacted: 1221 Representative spoke with at DME Agency: [place] ([place]) Interventions     Readmission Risk Interventions Readmission Risk Prevention Plan 08/05/2021  Transportation Screening Complete  HRI or [place] Complete  Social Work Consult for [place] Planning/Counseling [place] Screening Not Applicable  Medication Review Press photographer) Complete  Some recent data might be hidden

## 2021-08-08 NOTE — Discharge Summary (Signed)
Physician Discharge Summary  [name] QZR:[id number] DOB: 1933-09-22 DOA: 08/01/2021  PCP: [name], DO  Admit date: 08/01/2021 Discharge date: 08/08/2021  Admitted From: Home Disposition: Home  Recommendations for Outpatient Follow-up:  Patient will follow up with pulmonology She will also follow-up with hematology to be considered for ESA if hemoglobin does not improve  Discharge Condition: Stable CODE STATUS: DNR Diet recommendation: Heart healthy  Brief/Interim Summary: [name] is a 87 y.o. female with medical history significant for CKD 3, hypertension, diastolic CHF, atrial fibrillation. Patient presented to the ED with complaints of generalized weakness for 2 days, dry cough and difficulty breathing.  She was admitted with acute hypoxemic respiratory failure secondary to bronchitis and continues to have a significant cough with congestion.  She has been started on IV steroids and breathing treatments.  She continued to have persistent wheezing and cough and was seen by pulmonology 12/19 with changes to some of her medications which have allowed for further improvement.  She was also seen by oncology 12/19 and she is receiving iron infusions  Discharge Diagnoses:  Principal Problem:   Acute respiratory failure with hypoxia ([place]) Active Problems:   Essential hypertension   Normocytic anemia   Paroxysmal atrial fibrillation (HCC)   CKD (chronic kidney disease) stage 3, GFR 30-59 ml/min (HCC)   Chronic diastolic heart failure (HCC)   Hyponatremia   Acute bronchitis   Malnutrition of moderate degree   History of right breast cancer  Acute hypoxemic respiratory failure secondary to bronchitis/rhinitis/postnasal drip/possible reflux -Procalcitonin low and COVID and influenza test negative -Treated with Solu-Medrol and subsequently transition to prednisone -Treated with Pulmicort, Candiss Norse per pulmonology -Tessalon perles 12/17 -Tussionex added  12/19 -Weaned off O2 -Augmentin started by pulmonology 12/19 -Nasal irrigation and Pepcid per pulmonology started 12/19 -Pulmonary recommended transitioning steroid inhaler to [facility], but since it is not covered by her insurance, she will be discharged with Symbicort   Hyponatremia-stable -Currently improving and likely from poor oral intake as well as use of chlorthalidone at home   Chronic anemia -Secondary to CKD and relative iron deficiency -Resume home iron supplementation -Follows with Dr. Delton Coombes, who has consulted on patient 12/19 with IV iron infusion -Can consider ESA as an outpatient if hemoglobin does not significantly improve.   Hypertension-elevated -Resumed lisinopril 12/20 -Blood pressure still remain elevated -She is chronically on atenolol, will substitute for bisoprolol due to selective beta blockade. -IV hydralazine added for hypertension control   History of left atrial tachycardia/PAF -Flecainide and atenolol -Atenolol changed to bisoprolol for more selective beta blockade -Followed by Dr. Percival Spanish and is not on anticoagulation   CKD stage IIIb -Fluctuating creatinine levels, continue to monitor and repeat BMP   Chronic diastolic CHF -Currently stable and compensated -Echo 2019 with EF 60-65% and grade 2 diastolic dysfunction   Stage I breast cancer -Follows with Dr. Delton Coombes -Resume letrozole  Discharge Instructions  Discharge Instructions     Diet - low sodium heart healthy   Complete by: As directed    For home use only DME Nebulizer machine   Complete by: As directed    Patient needs a nebulizer to treat with the following condition: Asthmatic bronchitis   Length of Need: 6 Months   Increase activity slowly   Complete by: As directed       Allergies as of 08/08/2021       Reactions   Amlodipine Other (See Comments)   edema   Xeroform Occlusive Gauze Strip [employer]  Tribromoph-petrolatum]    Isosorbide Nitrate Nausea Only    Sulfonamide Derivatives Nausea Only, Rash   Terazosin Hives, Nausea Only        Medication List     STOP taking these medications    atenolol-chlorthalidone 50-25 MG tablet Commonly known as: TENORETIC       TAKE these medications    acetaminophen 325 MG tablet Commonly known as: TYLENOL Take 325 mg by mouth daily as needed for moderate pain or headache.   albuterol (2.5 MG/3ML) 0.083% nebulizer solution Commonly known as: PROVENTIL Take 3 mLs (2.5 mg total) by nebulization every 4 (four) hours as needed for wheezing or shortness of breath.   albuterol 108 (90 Base) MCG/ACT inhaler Commonly known as: VENTOLIN HFA Inhale 2 puffs into the lungs every 6 (six) hours as needed for wheezing or shortness of breath.   amoxicillin-clavulanate 500-125 MG tablet Commonly known as: AUGMENTIN Take 1 tablet (500 mg total) by mouth every 12 (twelve) hours.   atorvastatin 20 MG tablet Commonly known as: LIPITOR TAKE 1/2 TABLET BY MOUTH EVERY DAY   azelastine 0.1 % nasal spray Commonly known as: ASTELIN Place 1 spray into both nostrils 2 (two) times daily.   benzonatate 200 MG capsule Commonly known as: TESSALON Take 1 capsule (200 mg total) by mouth 2 (two) times daily as needed for cough.   bisoprolol 5 MG tablet Commonly known as: ZEBETA Take 1 tablet (5 mg total) by mouth daily. Start taking on: August 09, 2021   budesonide-formoterol 160-4.5 MCG/ACT inhaler Commonly known as: Symbicort Inhale 2 puffs into the lungs 2 (two) times daily.   calcium-vitamin D 500-200 MG-UNIT tablet Commonly known as: OSCAL WITH D Take 2 tablets by mouth at bedtime.   chlorpheniramine-HYDROcodone 10-8 MG/5ML Suer Commonly known as: TUSSIONEX Take 5 mLs by mouth every 12 (twelve) hours.   cholecalciferol 1000 units tablet Commonly known as: VITAMIN D Take 1,000 Units by mouth daily.   diclofenac Sodium 1 % Gel Commonly known as: VOLTAREN Apply 2 g topically 4 (four) times  daily.   ferrous sulfate 325 (65 FE) MG tablet Take 325 mg by mouth 2 (two) times daily with a meal.   flecainide 50 MG tablet Commonly known as: TAMBOCOR Take 1 tablet (50 mg total) by mouth 2 (two) times daily. PATIENT NEEDS TO KEEP SCHEDULED APPT FOR FUTURE REFILLS.   fluticasone 50 MCG/ACT nasal spray Commonly known as: FLONASE Place 1 spray into both nostrils 2 (two) times daily.   guaiFENesin 600 MG 12 hr tablet Commonly known as: MUCINEX Take 1 tablet (600 mg total) by mouth 2 (two) times daily.   hydrALAZINE 100 MG tablet Commonly known as: APRESOLINE Take 100 mg by mouth 2 (two) times daily.   hydroxypropyl methylcellulose / hypromellose 2.5 % ophthalmic solution Commonly known as: ISOPTO TEARS / GONIOVISC Place 1 drop into both eyes daily.   letrozole 2.5 MG tablet Commonly known as: FEMARA TAKE 1 TABLET BY MOUTH EVERY DAY   levothyroxine 75 MCG tablet Commonly known as: SYNTHROID Take 1 tablet (75 mcg total) by mouth daily.   lisinopril 20 MG tablet Commonly known as: ZESTRIL Take 1 tablet (20 mg total) by mouth daily. Per Dr Posey Pronto 01/2021 What changed: when to take this   mirtazapine 7.5 MG tablet Commonly known as: REMERON Take 1 tablet (7.5 mg total) by mouth at bedtime.   ondansetron 4 MG disintegrating tablet Commonly known as: Zofran ODT Take 1 tablet (4 mg total) by mouth every 8 (  eight) hours as needed.   predniSONE 10 MG tablet Commonly known as: DELTASONE Take 30mg  po daily for 2 days then 20mg  daily for 2 days then 10mg  daily for 2 days then stop   Procare Spacer/Adult Mask Devi Use as directed               Durable Medical Equipment  (From admission, onward)           Start     Ordered   08/08/21 0000  For home use only DME Nebulizer machine       Question Answer Comment  Patient needs a nebulizer to treat with the following condition Asthmatic bronchitis   Length of Need 6 Months      08/08/21 1126             Follow-up Information     Ronnie Doss M, DO. Schedule an appointment as soon as possible for a visit in 2 week(s).   Specialty: Family Medicine Contact information: [place] [address] [phone]         Chesley Mires, MD Follow up.   Specialty: Pulmonary Disease Why: office will contact you with appointment Contact information: [address] [place] [address] [phone]                Allergies  Allergen Reactions   Amlodipine Other (See Comments)    edema   Xeroform Occlusive Gauze Strip [Bismuth Tribromoph-Petrolatum]    Isosorbide Nitrate Nausea Only   Sulfonamide Derivatives Nausea Only and Rash   Terazosin Hives and Nausea Only    Consultations: Pulmonology   Procedures/Studies: DG Chest 2 View  Result Date: 08/01/2021 CLINICAL DATA:  Chest congestion. EXAM: CHEST - 2 VIEW COMPARISON:  March 10, 2021. FINDINGS: Stable cardiomegaly. Both lungs are clear. The visualized skeletal structures are unremarkable. IMPRESSION: No active cardiopulmonary disease. Electronically Signed   By: Marijo Conception M.D.   On: 08/01/2021 14:15   DG CHEST PORT 1 VIEW  Result Date: 08/03/2021 CLINICAL DATA:  Nonproductive cough starting today EXAM: PORTABLE CHEST 1 VIEW COMPARISON:  Chest radiograph 08/01/2021 FINDINGS: Heart is enlarged, unchanged.  The mediastinal contours are stable. There is no new or worsening focal airspace disease. There is no pleural effusion or pneumothorax. The bones are stable. IMPRESSION: Stable chest with no radiographic evidence of acute cardiopulmonary process. Electronically Signed   By: Valetta Mole M.D.   On: 08/03/2021 09:47      Subjective: She is feeling better.  Shortness of breath improving.  Currently on room air.  Discharge Exam: Vitals:   08/08/21 0722 08/08/21 0727 08/08/21 1234 08/08/21 1306  BP:    (!) 169/64  Pulse:    71  Resp:    18  Temp:    99.2 F (37.3 C)  TempSrc:    Oral   SpO2: 93% 93% 94% 94%  Weight:      Height:        General: Pt is alert, awake, not in acute distress Cardiovascular: RRR, S1/S2 +, no rubs, no gallops Respiratory: CTA bilaterally, no wheezing, no rhonchi Abdominal: Soft, NT, ND, bowel sounds + Extremities: no edema, no cyanosis    The results of significant diagnostics from this hospitalization (including imaging, microbiology, ancillary and laboratory) are listed below for reference.     Microbiology: Recent Results (from the past 240 hour(s))  Resp Panel by RT-PCR (Flu A&B, Covid) Nasopharyngeal Swab     Status: None  Collection Time: 08/01/21  2:44 PM   Specimen: Nasopharyngeal Swab; Nasopharyngeal(NP) swabs in vial transport medium  Result Value Ref Range Status   SARS Coronavirus 2 by RT PCR NEGATIVE NEGATIVE Final    Comment: (NOTE) SARS-CoV-2 target nucleic acids are NOT DETECTED.  The SARS-CoV-2 RNA is generally detectable in upper respiratory specimens during the acute phase of infection. The lowest concentration of SARS-CoV-2 viral copies this assay can detect is 138 copies/mL. A negative result does not preclude SARS-Cov-2 infection and should not be used as the sole basis for treatment or other patient management decisions. A negative result may occur with  improper specimen collection/handling, submission of specimen other than nasopharyngeal swab, presence of viral mutation(s) within the areas targeted by this assay, and inadequate number of viral copies(<138 copies/mL). A negative result must be combined with clinical observations, patient history, and epidemiological information. The expected result is Negative.  Fact Sheet for Patients:  EntrepreneurPulse.com.au  Fact Sheet for Healthcare Providers:  IncredibleEmployment.be  This test is no t yet approved or cleared by the [place] FDA and  has been authorized for detection and/or diagnosis of SARS-CoV-2  by FDA under an Emergency Use Authorization (EUA). This EUA will remain  in effect (meaning this test can be used) for the duration of the COVID-19 declaration under Section 564(b)(1) of the Act, 21 U.S.C.section 360bbb-3(b)(1), unless the authorization is terminated  or revoked sooner.       Influenza A by PCR NEGATIVE NEGATIVE Final   Influenza B by PCR NEGATIVE NEGATIVE Final    Comment: (NOTE) The Xpert Xpress SARS-CoV-2/FLU/RSV plus assay is intended as an aid in the diagnosis of influenza from Nasopharyngeal swab specimens and should not be used as a sole basis for treatment. Nasal washings and aspirates are unacceptable for Xpert Xpress SARS-CoV-2/FLU/RSV testing.  Fact Sheet for Patients: EntrepreneurPulse.com.au  Fact Sheet for Healthcare Providers: IncredibleEmployment.be  This test is not yet approved or cleared by the [place] FDA and has been authorized for detection and/or diagnosis of SARS-CoV-2 by FDA under an Emergency Use Authorization (EUA). This EUA will remain in effect (meaning this test can be used) for the duration of the COVID-19 declaration under Section 564(b)(1) of the Act, 21 U.S.C. section 360bbb-3(b)(1), unless the authorization is terminated or revoked.  Performed at [facility], [address], [place],  [address]      Labs: BNP (last 3 results) No results for input(s): BNP in the last 8760 hours. Basic Metabolic Panel: Recent Labs  Lab 08/03/21 0441 08/04/21 0436 08/04/21 1055 08/05/21 0519 08/06/21 0514 08/07/21 0459  NA 129* 131*  --  134* 134* 134*  K 4.3 4.3  --  4.1 4.3 4.4  CL 95* 98  --  99 100 97*  CO2 25 26  --  26 27 28   GLUCOSE 168* 156*  --  161* 152* 163*  BUN 43* 43*  --  43* 36* 42*  CREATININE 1.80* 1.55*  --  1.77* 1.52* 1.50*  CALCIUM 8.3* 8.2*  --  8.4* 8.2* 8.2*  MG 1.9 1.9 1.9  --  1.7 1.9   Liver Function Tests: No results for input(s): AST, ALT, ALKPHOS,  BILITOT, PROT, ALBUMIN in the last 168 hours. No results for input(s): LIPASE, AMYLASE in the last 168 hours. No results for input(s): AMMONIA in the last 168 hours. CBC: Recent Labs  Lab 08/03/21 0441 08/04/21 0436 08/05/21 0519 08/06/21 0514 08/07/21 0459  WBC 10.0 13.5* 10.9* 11.3* 12.0*  HGB  8.2* 8.0* 8.7* 8.4* 8.0*  HCT 25.9* 25.8* 26.5* 27.0* 25.7*  MCV 89.9 89.6 89.8 89.7 89.9  PLT 131* 140* 136* 110* 106*   Cardiac Enzymes: No results for input(s): CKTOTAL, CKMB, CKMBINDEX, TROPONINI in the last 168 hours. BNP: Invalid input(s): POCBNP CBG: No results for input(s): GLUCAP in the last 168 hours. D-Dimer No results for input(s): DDIMER in the last 72 hours. Hgb A1c No results for input(s): HGBA1C in the last 72 hours. Lipid Profile No results for input(s): CHOL, HDL, LDLCALC, TRIG, CHOLHDL, LDLDIRECT in the last 72 hours. Thyroid function studies No results for input(s): TSH, T4TOTAL, T3FREE, THYROIDAB in the last 72 hours.  Invalid input(s): FREET3 Anemia work up No results for input(s): VITAMINB12, FOLATE, FERRITIN, TIBC, IRON, RETICCTPCT in the last 72 hours. Urinalysis    Component Value Date/Time   COLORURINE STRAW (A) 06/03/2020 1202   APPEARANCEUR CLEAR 06/03/2020 1202   APPEARANCEUR Clear 10/11/2019 1326   LABSPEC 1.005 06/03/2020 1202   PHURINE 8.0 06/03/2020 1202   GLUCOSEU NEGATIVE 06/03/2020 1202   HGBUR NEGATIVE 06/03/2020 1202   BILIRUBINUR NEGATIVE 06/03/2020 1202   BILIRUBINUR Negative 10/11/2019 1326   KETONESUR NEGATIVE 06/03/2020 1202   PROTEINUR NEGATIVE 06/03/2020 1202   UROBILINOGEN 0.2 11/27/2007 1051   NITRITE NEGATIVE 06/03/2020 1202   LEUKOCYTESUR NEGATIVE 06/03/2020 1202   Sepsis Labs Invalid input(s): PROCALCITONIN,  WBC,  LACTICIDVEN Microbiology Recent Results (from the past 240 hour(s))  Resp Panel by RT-PCR (Flu A&B, Covid) Nasopharyngeal Swab     Status: None   Collection Time: 08/01/21  2:44 PM   Specimen:  Nasopharyngeal Swab; Nasopharyngeal(NP) swabs in vial transport medium  Result Value Ref Range Status   SARS Coronavirus 2 by RT PCR NEGATIVE NEGATIVE Final    Comment: (NOTE) SARS-CoV-2 target nucleic acids are NOT DETECTED.  The SARS-CoV-2 RNA is generally detectable in upper respiratory specimens during the acute phase of infection. The lowest concentration of SARS-CoV-2 viral copies this assay can detect is 138 copies/mL. A negative result does not preclude SARS-Cov-2 infection and should not be used as the sole basis for treatment or other patient management decisions. A negative result may occur with  improper specimen collection/handling, submission of specimen other than nasopharyngeal swab, presence of viral mutation(s) within the areas targeted by this assay, and inadequate number of viral copies(<138 copies/mL). A negative result must be combined with clinical observations, patient history, and epidemiological information. The expected result is Negative.  Fact Sheet for Patients:  EntrepreneurPulse.com.au  Fact Sheet for Healthcare Providers:  IncredibleEmployment.be  This test is no t yet approved or cleared by the [place] FDA and  has been authorized for detection and/or diagnosis of SARS-CoV-2 by FDA under an Emergency Use Authorization (EUA). This EUA will remain  in effect (meaning this test can be used) for the duration of the COVID-19 declaration under Section 564(b)(1) of the Act, 21 U.S.C.section 360bbb-3(b)(1), unless the authorization is terminated  or revoked sooner.       Influenza A by PCR NEGATIVE NEGATIVE Final   Influenza B by PCR NEGATIVE NEGATIVE Final    Comment: (NOTE) The Xpert Xpress SARS-CoV-2/FLU/RSV plus assay is intended as an aid in the diagnosis of influenza from Nasopharyngeal swab specimens and should not be used as a sole basis for treatment. Nasal washings and aspirates are unacceptable for  Xpert Xpress SARS-CoV-2/FLU/RSV testing.  Fact Sheet for Patients: EntrepreneurPulse.com.au  Fact Sheet for Healthcare Providers: IncredibleEmployment.be  This test is not yet approved or cleared  by the [place] and has been authorized for detection and/or diagnosis of SARS-CoV-2 by FDA under an Emergency Use Authorization (EUA). This EUA will remain in effect (meaning this test can be used) for the duration of the COVID-19 declaration under Section 564(b)(1) of the Act, 21 U.S.C. section 360bbb-3(b)(1), unless the authorization is terminated or revoked.  Performed at [facility], [address] [place], [place] [address]      Time coordinating discharge: 16mins  SIGNED:   Kathie Dike, MD  [facility] 08/08/2021, 9:48 PM   If 7PM-7AM, please contact night-coverage [email or web address]

## 2021-08-08 NOTE — Progress Notes (Signed)
[facility]   Patient name: [name] Admit date: 08/01/2021  DOB: 1934/07/11 LOS: 6  MRN: [id number] Consult date: 08/05/2021  Referring provider: Dr. Manuella Ghazi, [facility]: Cough    History:  87 yo female presented to [facility] ER with weakness, dyspnea and dry cough.  SpO2 in ER down to 83% on room air.  Noted to have wheezing and started on bronchodilator therapy and supplemental oxygen in ER.  She also started on solumedrol.  She had persistent cough and wheezing, and PCCM asked to assess.  Past medical history:  CKD 3a, HTN, Diastolic CHF, A fib, Anemia, OA, Asthma, HLD, Hypothyroidism, Rt renal artery stenosis, Vertigo, Breast cancer, Esophageal candidiasis  Studies:    Micro:  COVID/Flu 12/15 >> negative  Lines:     Antibiotics:  Augmentin 12/19 >>   Consults:      Interim history:  Feeling better.  Feels like she is ready to go home.  Still has cough, but not as congested.  Not having wheeze.  Chest soreness better.  Vital signs:  BP (!) 164/69 (BP Location: Left Arm)    Pulse 67    Temp 98.1 F (36.7 C) (Oral)    Resp 20    Ht 5\' 5"  (1.651 m)    Wt 45.5 kg    SpO2 93%    BMI 16.69 kg/m   Intake/output:  I/O last 3 completed shifts: In: 61 [P.O.:560] Out: -    Physical exam:   General - alert Eyes - pupils reactive ENT - no sinus tenderness, no stridor Cardiac - regular rate/rhythm, no murmur Chest - equal breath sounds b/l, no wheezing or rales Abdomen - soft, non tender, + bowel sounds Extremities - no cyanosis, clubbing, or edema Skin - no rashes Neuro - normal strength, moves extremities, follows commands Psych - normal mood and behavior   Best practice:   DVT - SCDs SUP - Pepcid Nutrition - heart healthy   Assessment/plan:   Persistent cough and wheezing. - likely combination of acute asthmatic bronchitis, rhinitis with post nasal drip, and possible reflux - changed to prednisone 30 mg  on 12/22; can wean off over the course of the next 1 week - will transition to dulera 200 two puffs bid - prn albuterol - day 4 of 5 for augmentin - prn mucinex - continue nasal irrigation, azelastine, flonase  Okay for d/c home from pulmonary standpoint.  Will have my office staff contact her to arrange for pulmonary office follow up in the first week of January.  Resolved hospital problems:    Goals of care/Family discussions:  Code status: DNR  Labs:   CMP Latest Ref Rng & Units 08/07/2021 08/06/2021 08/05/2021  Glucose 70 - 99 mg/dL 163(H) 152(H) 161(H)  BUN 8 - 23 mg/dL 42(H) 36(H) 43(H)  Creatinine 0.44 - 1.00 mg/dL 1.50(H) 1.52(H) 1.77(H)  Sodium 135 - 145 mmol/L 134(L) 134(L) 134(L)  Potassium 3.5 - 5.1 mmol/L 4.4 4.3 4.1  Chloride 98 - 111 mmol/L 97(L) 100 99  CO2 22 - 32 mmol/L 28 27 26   Calcium 8.9 - 10.3 mg/dL 8.2(L) 8.2(L) 8.4(L)  Total Protein 6.5 - 8.1 g/dL - - -  Total Bilirubin 0.3 - 1.2 mg/dL - - -  Alkaline Phos 38 - 126 U/L - - -  AST 15 - 41 U/L - - -  ALT 0 - 44 U/L - - -    CBC Latest Ref Rng & Units 08/07/2021 08/06/2021 08/05/2021  WBC  4.0 - 10.5 K/uL 12.0(H) 11.3(H) 10.9(H)  Hemoglobin 12.0 - 15.0 g/dL 8.0(L) 8.4(L) 8.7(L)  Hematocrit 36.0 - 46.0 % 25.7(L) 27.0(L) 26.5(L)  Platelets 150 - 400 K/uL 106(L) 110(L) 136(L)    Signature:  Chesley Mires, MD [place] Pager - [phone] 08/08/2021, 7:53 AM

## 2021-08-09 ENCOUNTER — Other Ambulatory Visit: Payer: Self-pay | Admitting: Family Medicine

## 2021-08-14 ENCOUNTER — Telehealth: Payer: Self-pay

## 2021-08-14 DIAGNOSIS — J209 Acute bronchitis, unspecified: Secondary | ICD-10-CM | POA: Diagnosis not present

## 2021-08-14 DIAGNOSIS — Z7951 Long term (current) use of inhaled steroids: Secondary | ICD-10-CM | POA: Diagnosis not present

## 2021-08-14 NOTE — Telephone Encounter (Signed)
-----   Message from Chesley Mires, MD sent at 08/08/2021  8:08 AM EST ----- [name] will be getting discharged today from [facility].  Can you contact her tomorrow to get her schedule for hospital follow up visit with me during the first week of January.  Okay to double book visit.

## 2021-08-14 NOTE — Telephone Encounter (Signed)
Called and spoke to patient. Only day and time that works for her for an appt is on [place]. Jan 5 at 11:00. Scheduled her for 11:15 08/23/21. Double booked.

## 2021-08-20 DIAGNOSIS — I340 Nonrheumatic mitral (valve) insufficiency: Secondary | ICD-10-CM | POA: Insufficient documentation

## 2021-08-20 NOTE — Progress Notes (Signed)
Cardiology Office Note   Date:  08/21/2021   ID:  [name], DOB Jun 15, 1934, MRN [id number]  PCP:  [name], DO  Cardiologist:   Minus Breeding, MD   Chief Complaint  Patient presents with   Fatigue      History of Present Illness: [name] is a 88 y.o. female who presents with difficult to control with chronic diastolic CHF, and arrhythmias.  She was recently in the hospital with hypoxic respiratory failure.  I reviewed these records for this visit.    She was treated for bronchitis.  She had chronic diastolic HF.     She says that she is slowly getting better.  She is weak.  She lost about 4 pounds.  She has not had any new shortness of breath, PND or orthopnea.  She has not had any new cough.  She denies any chest pressure, neck or arm discomfort.  She has not really noticed any palpitations, presyncope or syncope   Past Medical History:  Diagnosis Date   Anemia    Arthritis    Asthma    Atrial fibrillation ([place])    Basal cell carcinoma 02/06/2009   Left ear targus- ([facility])   Basal cell carcinoma 09/28/2008   Right back-(CX35FU)   CKD (chronic kidney disease), stage III (HCC)    Heart murmur    Hyperlipidemia    Hypertension    Hypothyroidism    Nodule of right lung    Right upper lobe   PONV (postoperative nausea and vomiting)    Renal insufficiency    Chronic   Renal vascular disease    Right renal artery stenosis ([place]) 05/09/2015   Squamous cell carcinoma of skin 09/07/2019   KA-Left shin-txpbx   Vertigo     Past Surgical History:  Procedure Laterality Date   BIOPSY  04/09/2021   Procedure: BIOPSY;  Surgeon: Harvel Quale, MD;  [facility];  Service: Gastroenterology;;   BREAST LUMPECTOMY WITH RADIOACTIVE SEED LOCALIZATION Right 04/08/2018   Procedure: BREAST LUMPECTOMY WITH RADIOACTIVE SEED LOCALIZATION;  Surgeon: Rolm Bookbinder, MD;  Location: [place];  Service: General;  Laterality: Right;   COLONOSCOPY   2016   diverticulosis, hemorrhoids   ESOPHAGOGASTRODUODENOSCOPY  2016   normal   ESOPHAGOGASTRODUODENOSCOPY (EGD) WITH PROPOFOL N/A 04/09/2021   Procedure: ESOPHAGOGASTRODUODENOSCOPY (EGD) WITH PROPOFOL;  Surgeon: Harvel Quale, MD;  [facility];  Service: Gastroenterology;  Laterality: N/A;  12:45   IR GENERIC HISTORICAL  08/29/2016   IR US GUIDE VASC ACCESS RIGHT 08/29/2016 [facility]   IR GENERIC HISTORICAL  08/29/2016   IR RENAL BILAT S&I MOD SED 08/29/2016 [facility]   KNEE ARTHROSCOPY Right    2019   THYROIDECTOMY, PARTIAL Right 1981   middle lobe removed 1st, then right lobe removed 7-8 years later (approx. 1988)     Current Outpatient Medications  Medication Sig Dispense Refill   acetaminophen (TYLENOL) 325 MG tablet Take 325 mg by mouth daily as needed for moderate pain or headache.     albuterol (PROVENTIL) (2.5 MG/3ML) 0.083% nebulizer solution Take 3 mLs (2.5 mg total) by nebulization every 4 (four) hours as needed for wheezing or shortness of breath. 75 mL 12   albuterol (VENTOLIN HFA) 108 (90 Base) MCG/ACT inhaler Inhale 2 puffs into the lungs every 6 (six) hours as needed for wheezing or shortness of breath. 8 g 2   amoxicillin-clavulanate (AUGMENTIN) 500-125 MG tablet Take 1 tablet (500 mg total)  by mouth every 12 (twelve) hours. 4 tablet 0   atorvastatin (LIPITOR) 20 MG tablet TAKE 1/2 TABLET BY MOUTH EVERY DAY 45 tablet 1   azelastine (ASTELIN) 0.1 % nasal spray Place 1 spray into both nostrils 2 (two) times daily. 30 mL 12   benzonatate (TESSALON) 200 MG capsule Take 1 capsule (200 mg total) by mouth 2 (two) times daily as needed for cough. 20 capsule 0   bisoprolol (ZEBETA) 5 MG tablet Take 1 tablet (5 mg total) by mouth daily. 30 tablet 0   budesonide-formoterol (SYMBICORT) 160-4.5 MCG/ACT inhaler Inhale 2 puffs into the lungs 2 (two) times daily. 1 each 12   calcium-vitamin D (OSCAL WITH D) 500-200 MG-UNIT per tablet Take 2 tablets by mouth  at bedtime.     chlorpheniramine-HYDROcodone (TUSSIONEX) 10-8 MG/5ML SUER Take 5 mLs by mouth every 12 (twelve) hours. 140 mL 0   cholecalciferol (VITAMIN D) 1000 units tablet Take 1,000 Units by mouth daily.     diclofenac Sodium (VOLTAREN) 1 % GEL Apply 2 g topically 4 (four) times daily. 100 g 0   ferrous sulfate 325 (65 FE) MG tablet Take 325 mg by mouth 2 (two) times daily with a meal.     fluticasone (FLONASE) 50 MCG/ACT nasal spray PLACE 1 SPRAY INTO BOTH NOSTRILS 2 (TWO) TIMES DAILY 16 mL 5   guaiFENesin (MUCINEX) 600 MG 12 hr tablet Take 1 tablet (600 mg total) by mouth 2 (two) times daily. 30 tablet 0   hydrALAZINE (APRESOLINE) 100 MG tablet Take 100 mg by mouth 2 (two) times daily.     hydroxypropyl methylcellulose / hypromellose (ISOPTO TEARS / GONIOVISC) 2.5 % ophthalmic solution Place 1 drop into both eyes daily.     letrozole (FEMARA) 2.5 MG tablet TAKE 1 TABLET BY MOUTH EVERY DAY 90 tablet 4   levothyroxine (SYNTHROID) 75 MCG tablet Take 1 tablet (75 mcg total) by mouth daily. 90 tablet 3   lisinopril (ZESTRIL) 20 MG tablet Take 1 tablet (20 mg total) by mouth daily. Per Dr Posey Pronto 01/2021     mirtazapine (REMERON) 7.5 MG tablet Take 1 tablet (7.5 mg total) by mouth at bedtime. 90 tablet 1   ondansetron (ZOFRAN ODT) 4 MG disintegrating tablet Take 1 tablet (4 mg total) by mouth every 8 (eight) hours as needed. 30 tablet 2   Spacer/Aero-Holding Chambers (PROCARE SPACER/ADULT MASK) DEVI Use as directed 1 each 0   flecainide (TAMBOCOR) 50 MG tablet Take 1 tablet (50 mg total) by mouth 2 (two) times daily. 180 tablet 3   No current facility-administered medications for this visit.    Allergies:   Amlodipine, Xeroform occlusive gauze strip [bismuth tribromoph-petrolatum], Isosorbide nitrate, Sulfonamide derivatives, and Terazosin    ROS:  Please see the history of present illness.   Otherwise, review of systems are positive for none.   All other systems are reviewed and negative.     PHYSICAL EXAM: VS:  BP (!) 142/60    Pulse 65    Ht 5\' 5"  (1.651 m)    Wt 106 lb 12.8 oz (48.4 kg)    SpO2 96%    BMI 17.77 kg/m  , BMI Body mass index is 17.77 kg/m. GENERAL:  Well appearing NECK:  No jugular venous distention, waveform within normal limits, carotid upstroke brisk and symmetric, no bruits, no thyromegaly LUNGS:  Clear to auscultation bilaterally CHEST:  Unremarkable HEART:  PMI not displaced or sustained,S1 and S2 within normal limits, no S3, no S4, no clicks, no  rubs, 2 out of 6 apical systolic murmur radiating up aortic outflow tract and early peaking, no diastolic murmurs ABD:  Flat, positive bowel sounds normal in frequency in pitch, no bruits, no rebound, no guarding, no midline pulsatile mass, no hepatomegaly, no splenomegaly EXT:  2 plus pulses throughout, no edema, no cyanosis no clubbing   EKG:  EKG is  ordered today. The ekg ordered today demonstrates sinus rhythm, rate 65, axis within normal limits, QTC prolonged but stable   Recent Labs: 06/24/2021: TSH 1.890 08/01/2021: ALT 16 08/07/2021: BUN 42; Creatinine, Ser 1.50; Hemoglobin 8.0; Magnesium 1.9; Platelets 106; Potassium 4.4; Sodium 134    Lipid Panel    Component Value Date/Time   CHOL 133 06/24/2021 0856   CHOL 159 01/13/2013 1042   TRIG 83 06/24/2021 0856   TRIG 84 03/10/2016 0928   TRIG 142 01/13/2013 1042   HDL 57 06/24/2021 0856   HDL 56 03/10/2016 0928   HDL 54 01/13/2013 1042   CHOLHDL 2.3 06/24/2021 0856   CHOLHDL 4.6 06/05/2007 0125   VLDL 54 (H) 06/05/2007 0125   LDLCALC 60 06/24/2021 0856   LDLCALC 76 03/14/2014 0822   LDLCALC 77 01/13/2013 1042      Wt Readings from Last 3 Encounters:  08/21/21 106 lb 12.8 oz (48.4 kg)  08/01/21 100 lb 5 oz (45.5 kg)  08/01/21 109 lb (49.4 kg)      Other studies Reviewed: Additional studies/ records that were reviewed today include:  Hospital records. Review of the above records demonstrates:  Please see elsewhere in the note.       ASSESSMENT AND PLAN:   HFpEF:    She seems to be euvolemic.  She will continue the current regimen.  No change in therapy.   Atrial tachycardia/PAF: The predominant rhythm has been atrial tachycardia.  We are not using anticoagulation because of that.  No change in therapy.  She tolerates flecainide.  HTN : Blood pressure is at target.  No change in therapy.  Carotid Stenosis: She had very mild plaque in 2018.  No follow-up at this point.  Thrombocytopenia/leukopenia : She also had effusion during hospitalization.  She is followed routinely by hematology.  Ao Valve stenosis and MR: This was mild in 2019.  I will repeat an echocardiogram.    Current medicines are reviewed at length with the patient today.  The patient does not have concerns regarding medicines.  The following changes have been made: None  Labs/ tests ordered today include:   Orders Placed This Encounter  Procedures   EKG 12-Lead   ECHOCARDIOGRAM COMPLETE     Disposition:   FU with in 12 months.    Signed, Minus Breeding, MD  08/21/2021 3:16 PM    [facility]

## 2021-08-21 ENCOUNTER — Telehealth: Payer: Self-pay | Admitting: Cardiology

## 2021-08-21 ENCOUNTER — Encounter: Payer: Self-pay | Admitting: Cardiology

## 2021-08-21 ENCOUNTER — Other Ambulatory Visit: Payer: Self-pay

## 2021-08-21 ENCOUNTER — Ambulatory Visit (INDEPENDENT_AMBULATORY_CARE_PROVIDER_SITE_OTHER): Payer: PPO | Admitting: Cardiology

## 2021-08-21 VITALS — BP 142/60 | HR 65 | Ht 65.0 in | Wt 106.8 lb

## 2021-08-21 DIAGNOSIS — I471 Supraventricular tachycardia: Secondary | ICD-10-CM

## 2021-08-21 DIAGNOSIS — I10 Essential (primary) hypertension: Secondary | ICD-10-CM

## 2021-08-21 DIAGNOSIS — I5032 Chronic diastolic (congestive) heart failure: Secondary | ICD-10-CM

## 2021-08-21 DIAGNOSIS — I340 Nonrheumatic mitral (valve) insufficiency: Secondary | ICD-10-CM

## 2021-08-21 MED ORDER — FLECAINIDE ACETATE 50 MG PO TABS: 50.0000 mg | ORAL_TABLET | Freq: Two times a day (BID) | ORAL | 3 refills | Status: DC

## 2021-08-21 NOTE — Patient Instructions (Signed)
Medication Instructions:  The current medical regimen is effective;  continue present plan and medications.  *If you need a refill on your cardiac medications before your next appointment, please call your pharmacy*  Testing/Procedures: Your physician has requested that you have an echocardiogram. Echocardiography is a painless test that uses sound waves to create images of your heart. It provides your doctor with information about the size and shape of your heart and how well your hearts chambers and valves are working. This procedure takes approximately one hour. There are no restrictions for this procedure.  Follow-Up: At [facility], you and your health needs are our priority.  As part of our continuing mission to provide you with exceptional heart care, we have created designated Provider Care Teams.  These Care Teams include your primary Cardiologist (physician) and Advanced Practice Providers (APPs -  Physician Assistants and Nurse Practitioners) who all work together to provide you with the care you need, when you need it.  We recommend signing up for the patient portal called "MyChart".  Sign up information is provided on this After Visit Summary.  MyChart is used to connect with patients for Virtual Visits (Telemedicine).  Patients are able to view lab/test results, encounter notes, upcoming appointments, etc.  Non-urgent messages can be sent to your provider as well.   To learn more about what you can do with MyChart, go to [email or web address].    Your next appointment:   1 year(s)  The format for your next appointment:   In Person  Provider:   Minus Breeding, MD    Thank you for choosing [facility]!!

## 2021-08-21 NOTE — Telephone Encounter (Signed)
Checking percert on the following patient for testing scheduled at [facility].      ECHO  08/29/2021

## 2021-08-22 ENCOUNTER — Other Ambulatory Visit: Payer: Self-pay

## 2021-08-22 ENCOUNTER — Encounter: Payer: Self-pay | Admitting: Pulmonary Disease

## 2021-08-22 ENCOUNTER — Ambulatory Visit: Payer: PPO | Admitting: Pulmonary Disease

## 2021-08-22 VITALS — BP 118/58 | HR 74 | Temp 98.4°F | Ht 65.0 in | Wt 106.4 lb

## 2021-08-22 DIAGNOSIS — J069 Acute upper respiratory infection, unspecified: Secondary | ICD-10-CM

## 2021-08-22 DIAGNOSIS — J4540 Moderate persistent asthma, uncomplicated: Secondary | ICD-10-CM | POA: Diagnosis not present

## 2021-08-22 DIAGNOSIS — R053 Chronic cough: Secondary | ICD-10-CM | POA: Diagnosis not present

## 2021-08-22 MED ORDER — BENZONATATE 200 MG PO CAPS: 200.0000 mg | ORAL_CAPSULE | Freq: Two times a day (BID) | ORAL | 1 refills | Status: DC | PRN

## 2021-08-22 NOTE — Patient Instructions (Signed)
Symbicort two puffs in the morning and two puffs in the evening  Albuterol every 4 to 6 hours as needed for shortness of breath or wheezing  You can use flonase and azelastine nose sprays as needed for sinus congestion and runny nose  Sip water when you have the urge to cough  Use 1 teaspoon of honey twice per day to help with cough  Salt water gargle twice per day  Use sugarless candy to keep your mouth moist  Tessalon 200 mg three per day as needed to help with cough  Follow up in 2 months

## 2021-08-22 NOTE — Progress Notes (Signed)
Pulmonary, Critical Care, and Sleep Medicine  Chief Complaint  Patient presents with   Hospitalization Follow-up    Hosp. Stay for bronchitis form 08/01/21-08/08/21 at [facility].  Patient is still feeling weak.     Past Surgical History:  She  has a past surgical history that includes Thyroidectomy, partial (Right, 1981); ir generic historical (08/29/2016); ir generic historical (08/29/2016); Knee arthroscopy (Right); Breast lumpectomy with radioactive seed localization (Right, 04/08/2018); Colonoscopy (2016); Esophagogastroduodenoscopy (2016); Esophagogastroduodenoscopy (egd) with propofol (N/A, 04/09/2021); and biopsy (04/09/2021).  Past Medical History:  CKD 3a, HTN, Diastolic CHF, A fib, Anemia, OA, Asthma, HLD, Hypothyroidism, Rt renal artery stenosis, Vertigo, Breast cancer, Esophageal candidiasis  Constitutional:  BP (!) 118/58 (BP Location: Left Arm, Patient Position: Sitting)    Pulse 74    Temp 98.4 F (36.9 C) (Temporal)    Ht 5\' 5"  (1.651 m)    Wt 106 lb 6.4 oz (48.3 kg)    SpO2 96% Comment: ra   BMI 17.71 kg/m   Brief Summary:  [name] is a 88 y.o. female with allergic asthma.      Subjective:   She is here with her daughter.  She was in hospital from 08/01/21 to 08/08/21.  Treated with prednisone, augmentin, dulera, azelastine, and flonase.    Her main issues now are still feeling weak and having a dry cough.  She gets throat irritation especially when she talks. Not bringing up sputum.  Not wheezing anymore.  Tried coming of inhalers and nasal sprays.  Cough might be a little worse.  Physical Exam:   Appearance - well kempt   ENMT - no sinus tenderness, no oral exudate, no LAN, Mallampati 2 airway, no stridor  Respiratory - equal breath sounds bilaterally, no wheezing or rales  CV - s1s2 regular rate and rhythm, 2/6 SM  Ext - no clubbing, no edema  Skin - no rashes  Psych - normal mood and affect   Pulmonary testing:    Cardiac  Tests:  Echo 03/16/18 >> EF 60 to 65%, mod LVH, grade 2 DD, mild AS, mild MR, severe LA dilation, PASP 34 mmHg  Social History:  She  reports that she has never smoked. She has never used smokeless tobacco. She reports that she does not drink alcohol and does not use drugs.  Family History:  Her family history includes Cancer in her maternal grandmother; Hypercholesterolemia in her daughter and daughter; Hypertension in her brother and daughter; Lung cancer in her maternal uncle and maternal uncle; Stroke in her father; Thyroid disease in her mother.     Assessment/Plan:   Asthmatic bronchitis. - improved since she was in hospital - continue symbicort for now with spacer device - prn albuterol - discussed roles for her different inhalers  Postnasal drip. - improved from her hospital stay - prn flonase, azelastine  Chronic cough. - most of this likely related to throat irritation - sip water with urge to cough - 1 tsp local honey bid prn - tessalon 200 mg tid prn - salt water gargle bid - avoid forcing cough or clearing throat - allow voice rest - sugarless candy to keep mouth moist - if cough persists, then might need to transition off ACE inhibitor  Chronic diastolic CHF, Valvular heart disease, Atrial tachycardia/fibrillation. - followed by Dr. Minus Breeding with [facility] cardiology  Rt breast cancer. - followed by Dr. Delton Coombes with Oncology  Time Spent Involved in Patient Care on Day of Examination:  38 minutes  Follow up:   Patient Instructions  Symbicort two puffs in the morning and two puffs in the evening  Albuterol every 4 to 6 hours as needed for shortness of breath or wheezing  You can use flonase and azelastine nose sprays as needed for sinus congestion and runny nose  Sip water when you have the urge to cough  Use 1 teaspoon of honey twice per day to help with cough  Salt water gargle twice per day  Use sugarless candy to keep your mouth  moist  Tessalon 200 mg three per day as needed to help with cough  Follow up in 2 months  Medication List:   Allergies as of 08/22/2021       Reactions   Amlodipine Other (See Comments)   edema   Xeroform Occlusive Gauze Strip [bismuth Tribromoph-petrolatum]    Isosorbide Nitrate Nausea Only   Sulfonamide Derivatives Nausea Only, Rash   Terazosin Hives, Nausea Only        Medication List        Accurate as of August 22, 2021 11:57 AM. If you have any questions, ask your nurse or doctor.          STOP taking these medications    amoxicillin-clavulanate 500-125 MG tablet Commonly known as: AUGMENTIN Stopped by: Chesley Mires, MD   chlorpheniramine-HYDROcodone 10-8 MG/5ML Suer Commonly known as: TUSSIONEX Stopped by: Chesley Mires, MD       TAKE these medications    acetaminophen 325 MG tablet Commonly known as: TYLENOL Take 325 mg by mouth daily as needed for moderate pain or headache.   albuterol (2.5 MG/3ML) 0.083% nebulizer solution Commonly known as: PROVENTIL Take 3 mLs (2.5 mg total) by nebulization every 4 (four) hours as needed for wheezing or shortness of breath.   albuterol 108 (90 Base) MCG/ACT inhaler Commonly known as: VENTOLIN HFA Inhale 2 puffs into the lungs every 6 (six) hours as needed for wheezing or shortness of breath.   atorvastatin 20 MG tablet Commonly known as: LIPITOR TAKE 1/2 TABLET BY MOUTH EVERY DAY   azelastine 0.1 % nasal spray Commonly known as: ASTELIN Place 1 spray into both nostrils 2 (two) times daily.   benzonatate 200 MG capsule Commonly known as: TESSALON Take 1 capsule (200 mg total) by mouth 2 (two) times daily as needed for cough.   bisoprolol 5 MG tablet Commonly known as: ZEBETA Take 1 tablet (5 mg total) by mouth daily.   budesonide-formoterol 160-4.5 MCG/ACT inhaler Commonly known as: Symbicort Inhale 2 puffs into the lungs 2 (two) times daily.   calcium-vitamin D 500-200 MG-UNIT tablet Commonly known  as: OSCAL WITH D Take 2 tablets by mouth at bedtime.   cholecalciferol 1000 units tablet Commonly known as: VITAMIN D Take 1,000 Units by mouth daily.   diclofenac Sodium 1 % Gel Commonly known as: VOLTAREN Apply 2 g topically 4 (four) times daily.   ferrous sulfate 325 (65 FE) MG tablet Take 325 mg by mouth 2 (two) times daily with a meal.   flecainide 50 MG tablet Commonly known as: TAMBOCOR Take 1 tablet (50 mg total) by mouth 2 (two) times daily.   fluticasone 50 MCG/ACT nasal spray Commonly known as: FLONASE PLACE 1 SPRAY INTO BOTH NOSTRILS 2 (TWO) TIMES DAILY   guaiFENesin 600 MG 12 hr tablet Commonly known as: MUCINEX Take 1 tablet (600 mg total) by mouth 2 (two) times daily.   hydrALAZINE 100 MG tablet Commonly known as: APRESOLINE Take 100 mg by mouth  2 (two) times daily.   hydroxypropyl methylcellulose / hypromellose 2.5 % ophthalmic solution Commonly known as: ISOPTO TEARS / GONIOVISC Place 1 drop into both eyes daily.   letrozole 2.5 MG tablet Commonly known as: FEMARA TAKE 1 TABLET BY MOUTH EVERY DAY   levothyroxine 75 MCG tablet Commonly known as: SYNTHROID Take 1 tablet (75 mcg total) by mouth daily.   lisinopril 20 MG tablet Commonly known as: ZESTRIL Take 1 tablet (20 mg total) by mouth daily. Per Dr Posey Pronto 01/2021   mirtazapine 7.5 MG tablet Commonly known as: REMERON Take 1 tablet (7.5 mg total) by mouth at bedtime.   ondansetron 4 MG disintegrating tablet Commonly known as: Zofran ODT Take 1 tablet (4 mg total) by mouth every 8 (eight) hours as needed.   Procare Spacer/Adult Mask Devi Use as directed        Signature:  Chesley Mires, MD [place] Pager - [phone] 08/22/2021, 11:57 AM

## 2021-08-29 ENCOUNTER — Other Ambulatory Visit: Payer: Self-pay

## 2021-08-29 ENCOUNTER — Ambulatory Visit (HOSPITAL_BASED_OUTPATIENT_CLINIC_OR_DEPARTMENT_OTHER)
Admission: RE | Admit: 2021-08-29 | Discharge: 2021-08-29 | Disposition: A | Discharge status: Home / Self Care | Payer: PPO | Source: Ambulatory Visit | Attending: Cardiology | Admitting: Cardiology

## 2021-08-29 DIAGNOSIS — N170 Acute kidney failure with tubular necrosis: Secondary | ICD-10-CM | POA: Diagnosis not present

## 2021-08-29 DIAGNOSIS — Z888 Allergy status to other drugs, medicaments and biological substances status: Secondary | ICD-10-CM | POA: Diagnosis not present

## 2021-08-29 DIAGNOSIS — I130 Hypertensive heart and chronic kidney disease with heart failure and stage 1 through stage 4 chronic kidney disease, or unspecified chronic kidney disease: Secondary | ICD-10-CM | POA: Diagnosis present

## 2021-08-29 DIAGNOSIS — K264 Chronic or unspecified duodenal ulcer with hemorrhage: Secondary | ICD-10-CM | POA: Diagnosis present

## 2021-08-29 DIAGNOSIS — R627 Adult failure to thrive: Secondary | ICD-10-CM | POA: Diagnosis present

## 2021-08-29 DIAGNOSIS — C50911 Malignant neoplasm of unspecified site of right female breast: Secondary | ICD-10-CM | POA: Diagnosis present

## 2021-08-29 DIAGNOSIS — E871 Hypo-osmolality and hyponatremia: Secondary | ICD-10-CM | POA: Diagnosis present

## 2021-08-29 DIAGNOSIS — Z85828 Personal history of other malignant neoplasm of skin: Secondary | ICD-10-CM | POA: Diagnosis not present

## 2021-08-29 DIAGNOSIS — Z882 Allergy status to sulfonamides status: Secondary | ICD-10-CM | POA: Diagnosis not present

## 2021-08-29 DIAGNOSIS — R531 Weakness: Secondary | ICD-10-CM | POA: Diagnosis present

## 2021-08-29 DIAGNOSIS — R059 Cough, unspecified: Secondary | ICD-10-CM | POA: Diagnosis not present

## 2021-08-29 DIAGNOSIS — J449 Chronic obstructive pulmonary disease, unspecified: Secondary | ICD-10-CM | POA: Diagnosis not present

## 2021-08-29 DIAGNOSIS — Z823 Family history of stroke: Secondary | ICD-10-CM | POA: Diagnosis not present

## 2021-08-29 DIAGNOSIS — E039 Hypothyroidism, unspecified: Secondary | ICD-10-CM | POA: Diagnosis present

## 2021-08-29 DIAGNOSIS — I10 Essential (primary) hypertension: Secondary | ICD-10-CM | POA: Diagnosis not present

## 2021-08-29 DIAGNOSIS — R6881 Early satiety: Secondary | ICD-10-CM | POA: Diagnosis present

## 2021-08-29 DIAGNOSIS — K2971 Gastritis, unspecified, with bleeding: Secondary | ICD-10-CM | POA: Diagnosis present

## 2021-08-29 DIAGNOSIS — Z8349 Family history of other endocrine, nutritional and metabolic diseases: Secondary | ICD-10-CM | POA: Diagnosis not present

## 2021-08-29 DIAGNOSIS — D509 Iron deficiency anemia, unspecified: Secondary | ICD-10-CM | POA: Diagnosis not present

## 2021-08-29 DIAGNOSIS — K2950 Unspecified chronic gastritis without bleeding: Secondary | ICD-10-CM | POA: Diagnosis not present

## 2021-08-29 DIAGNOSIS — I480 Paroxysmal atrial fibrillation: Secondary | ICD-10-CM | POA: Diagnosis present

## 2021-08-29 DIAGNOSIS — N179 Acute kidney failure, unspecified: Secondary | ICD-10-CM | POA: Diagnosis present

## 2021-08-29 DIAGNOSIS — N1832 Chronic kidney disease, stage 3b: Secondary | ICD-10-CM | POA: Diagnosis not present

## 2021-08-29 DIAGNOSIS — K222 Esophageal obstruction: Secondary | ICD-10-CM | POA: Diagnosis present

## 2021-08-29 DIAGNOSIS — K209 Esophagitis, unspecified without bleeding: Secondary | ICD-10-CM | POA: Diagnosis not present

## 2021-08-29 DIAGNOSIS — R634 Abnormal weight loss: Secondary | ICD-10-CM | POA: Diagnosis present

## 2021-08-29 DIAGNOSIS — I5032 Chronic diastolic (congestive) heart failure: Secondary | ICD-10-CM

## 2021-08-29 DIAGNOSIS — Z79811 Long term (current) use of aromatase inhibitors: Secondary | ICD-10-CM | POA: Diagnosis not present

## 2021-08-29 DIAGNOSIS — D62 Acute posthemorrhagic anemia: Secondary | ICD-10-CM | POA: Diagnosis present

## 2021-08-29 DIAGNOSIS — K922 Gastrointestinal hemorrhage, unspecified: Secondary | ICD-10-CM | POA: Diagnosis not present

## 2021-08-29 DIAGNOSIS — Z20822 Contact with and (suspected) exposure to covid-19: Secondary | ICD-10-CM | POA: Diagnosis present

## 2021-08-29 DIAGNOSIS — K921 Melena: Secondary | ICD-10-CM | POA: Diagnosis not present

## 2021-08-29 DIAGNOSIS — R54 Age-related physical debility: Secondary | ICD-10-CM | POA: Diagnosis present

## 2021-08-29 DIAGNOSIS — I517 Cardiomegaly: Secondary | ICD-10-CM | POA: Diagnosis not present

## 2021-08-29 DIAGNOSIS — I350 Nonrheumatic aortic (valve) stenosis: Secondary | ICD-10-CM | POA: Diagnosis not present

## 2021-08-29 DIAGNOSIS — R058 Other specified cough: Secondary | ICD-10-CM | POA: Diagnosis not present

## 2021-08-29 DIAGNOSIS — Z66 Do not resuscitate: Secondary | ICD-10-CM | POA: Diagnosis present

## 2021-08-29 DIAGNOSIS — E034 Atrophy of thyroid (acquired): Secondary | ICD-10-CM | POA: Diagnosis not present

## 2021-08-29 DIAGNOSIS — D649 Anemia, unspecified: Secondary | ICD-10-CM | POA: Diagnosis not present

## 2021-08-29 DIAGNOSIS — N183 Chronic kidney disease, stage 3 unspecified: Secondary | ICD-10-CM | POA: Diagnosis present

## 2021-08-29 LAB — ECHOCARDIOGRAM COMPLETE
AR max vel: 1.17 cm2
AV Area VTI: 1.14 cm2
AV Area mean vel: 1.18 cm2
AV Mean grad: 27.7 mmHg
AV Peak grad: 49.7 mmHg
Ao pk vel: 3.52 m/s
Area-P 1/2: 3.65 cm2
S' Lateral: 2.3 cm

## 2021-08-29 NOTE — Progress Notes (Signed)
*  PRELIMINARY RESULTS* Echocardiogram 2D Echocardiogram has been performed.  [name] 08/29/2021, 1:38 PM

## 2021-08-31 ENCOUNTER — Encounter (HOSPITAL_COMMUNITY): Payer: Self-pay | Admitting: *Deleted

## 2021-08-31 ENCOUNTER — Emergency Department (HOSPITAL_COMMUNITY): Payer: PPO

## 2021-08-31 ENCOUNTER — Inpatient Hospital Stay (HOSPITAL_COMMUNITY)
Admission: EM | Admit: 2021-08-31 | Discharge: 2021-09-02 | DRG: 378 | Disposition: A | Discharge status: Home / Self Care | Payer: PPO | Attending: Internal Medicine | Admitting: Internal Medicine

## 2021-08-31 DIAGNOSIS — I480 Paroxysmal atrial fibrillation: Secondary | ICD-10-CM | POA: Diagnosis present

## 2021-08-31 DIAGNOSIS — Z79811 Long term (current) use of aromatase inhibitors: Secondary | ICD-10-CM

## 2021-08-31 DIAGNOSIS — R058 Other specified cough: Secondary | ICD-10-CM | POA: Diagnosis not present

## 2021-08-31 DIAGNOSIS — D649 Anemia, unspecified: Secondary | ICD-10-CM | POA: Diagnosis not present

## 2021-08-31 DIAGNOSIS — K2971 Gastritis, unspecified, with bleeding: Secondary | ICD-10-CM | POA: Diagnosis present

## 2021-08-31 DIAGNOSIS — Z66 Do not resuscitate: Secondary | ICD-10-CM | POA: Diagnosis present

## 2021-08-31 DIAGNOSIS — N179 Acute kidney failure, unspecified: Secondary | ICD-10-CM | POA: Diagnosis present

## 2021-08-31 DIAGNOSIS — N1832 Chronic kidney disease, stage 3b: Secondary | ICD-10-CM

## 2021-08-31 DIAGNOSIS — R531 Weakness: Secondary | ICD-10-CM | POA: Diagnosis present

## 2021-08-31 DIAGNOSIS — Z85828 Personal history of other malignant neoplasm of skin: Secondary | ICD-10-CM | POA: Diagnosis not present

## 2021-08-31 DIAGNOSIS — C50911 Malignant neoplasm of unspecified site of right female breast: Secondary | ICD-10-CM | POA: Diagnosis present

## 2021-08-31 DIAGNOSIS — D638 Anemia in other chronic diseases classified elsewhere: Secondary | ICD-10-CM | POA: Diagnosis present

## 2021-08-31 DIAGNOSIS — N170 Acute kidney failure with tubular necrosis: Secondary | ICD-10-CM | POA: Diagnosis not present

## 2021-08-31 DIAGNOSIS — E871 Hypo-osmolality and hyponatremia: Secondary | ICD-10-CM | POA: Diagnosis not present

## 2021-08-31 DIAGNOSIS — D62 Acute posthemorrhagic anemia: Secondary | ICD-10-CM | POA: Diagnosis not present

## 2021-08-31 DIAGNOSIS — N183 Chronic kidney disease, stage 3 unspecified: Secondary | ICD-10-CM | POA: Diagnosis present

## 2021-08-31 DIAGNOSIS — Z888 Allergy status to other drugs, medicaments and biological substances status: Secondary | ICD-10-CM | POA: Diagnosis not present

## 2021-08-31 DIAGNOSIS — K922 Gastrointestinal hemorrhage, unspecified: Secondary | ICD-10-CM

## 2021-08-31 DIAGNOSIS — K264 Chronic or unspecified duodenal ulcer with hemorrhage: Principal | ICD-10-CM | POA: Diagnosis present

## 2021-08-31 DIAGNOSIS — Z823 Family history of stroke: Secondary | ICD-10-CM

## 2021-08-31 DIAGNOSIS — R627 Adult failure to thrive: Secondary | ICD-10-CM | POA: Diagnosis present

## 2021-08-31 DIAGNOSIS — R634 Abnormal weight loss: Secondary | ICD-10-CM

## 2021-08-31 DIAGNOSIS — R54 Age-related physical debility: Secondary | ICD-10-CM | POA: Diagnosis present

## 2021-08-31 DIAGNOSIS — E034 Atrophy of thyroid (acquired): Secondary | ICD-10-CM | POA: Diagnosis not present

## 2021-08-31 DIAGNOSIS — K222 Esophageal obstruction: Secondary | ICD-10-CM | POA: Diagnosis present

## 2021-08-31 DIAGNOSIS — Z8349 Family history of other endocrine, nutritional and metabolic diseases: Secondary | ICD-10-CM | POA: Diagnosis not present

## 2021-08-31 DIAGNOSIS — Z8249 Family history of ischemic heart disease and other diseases of the circulatory system: Secondary | ICD-10-CM

## 2021-08-31 DIAGNOSIS — I10 Essential (primary) hypertension: Secondary | ICD-10-CM | POA: Diagnosis present

## 2021-08-31 DIAGNOSIS — I5032 Chronic diastolic (congestive) heart failure: Secondary | ICD-10-CM | POA: Diagnosis present

## 2021-08-31 DIAGNOSIS — E039 Hypothyroidism, unspecified: Secondary | ICD-10-CM | POA: Diagnosis present

## 2021-08-31 DIAGNOSIS — E785 Hyperlipidemia, unspecified: Secondary | ICD-10-CM | POA: Diagnosis present

## 2021-08-31 DIAGNOSIS — I130 Hypertensive heart and chronic kidney disease with heart failure and stage 1 through stage 4 chronic kidney disease, or unspecified chronic kidney disease: Secondary | ICD-10-CM | POA: Diagnosis not present

## 2021-08-31 DIAGNOSIS — Z20822 Contact with and (suspected) exposure to covid-19: Secondary | ICD-10-CM | POA: Diagnosis present

## 2021-08-31 DIAGNOSIS — N189 Chronic kidney disease, unspecified: Secondary | ICD-10-CM | POA: Diagnosis present

## 2021-08-31 DIAGNOSIS — Z882 Allergy status to sulfonamides status: Secondary | ICD-10-CM | POA: Diagnosis not present

## 2021-08-31 DIAGNOSIS — R6881 Early satiety: Secondary | ICD-10-CM | POA: Diagnosis not present

## 2021-08-31 DIAGNOSIS — Z801 Family history of malignant neoplasm of trachea, bronchus and lung: Secondary | ICD-10-CM

## 2021-08-31 HISTORY — DX: Gastrointestinal hemorrhage, unspecified: K92.2

## 2021-08-31 LAB — URINALYSIS, ROUTINE W REFLEX MICROSCOPIC
Bilirubin Urine: NEGATIVE
Glucose, UA: NEGATIVE mg/dL
Ketones, ur: NEGATIVE mg/dL
Leukocytes,Ua: NEGATIVE
Nitrite: NEGATIVE
Specific Gravity, Urine: 1.01 (ref 1.005–1.030)
pH: 7.5 (ref 5.0–8.0)

## 2021-08-31 LAB — COMPREHENSIVE METABOLIC PANEL
ALT: 12 U/L (ref 0–44)
AST: 16 U/L (ref 15–41)
Albumin: 3.2 g/dL — ABNORMAL LOW (ref 3.5–5.0)
Alkaline Phosphatase: 54 U/L (ref 38–126)
Anion gap: 10 (ref 5–15)
BUN: 23 mg/dL (ref 8–23)
CO2: 27 mmol/L (ref 22–32)
Calcium: 8.9 mg/dL (ref 8.9–10.3)
Chloride: 92 mmol/L — ABNORMAL LOW (ref 98–111)
Creatinine, Ser: 2 mg/dL — ABNORMAL HIGH (ref 0.44–1.00)
GFR, Estimated: 24 mL/min — ABNORMAL LOW (ref >60)
Glucose, Bld: 116 mg/dL — ABNORMAL HIGH (ref 70–99)
Potassium: 4.2 mmol/L (ref 3.5–5.1)
Sodium: 129 mmol/L — ABNORMAL LOW (ref 135–145)
Total Bilirubin: 0.6 mg/dL (ref 0.3–1.2)
Total Protein: 7 g/dL (ref 6.5–8.1)

## 2021-08-31 LAB — RESP PANEL BY RT-PCR (FLU A&B, COVID) ARPGX2
Influenza A by PCR: NEGATIVE
Influenza B by PCR: NEGATIVE
SARS Coronavirus 2 by RT PCR: NEGATIVE

## 2021-08-31 LAB — CBC WITH DIFFERENTIAL/PLATELET
Abs Immature Granulocytes: 0.02 10*3/uL (ref 0.00–0.07)
Basophils Absolute: 0 10*3/uL (ref 0.0–0.1)
Basophils Relative: 0 %
Eosinophils Absolute: 0.1 10*3/uL (ref 0.0–0.5)
Eosinophils Relative: 2 %
HCT: 22.5 % — ABNORMAL LOW (ref 36.0–46.0)
Hemoglobin: 7.4 g/dL — ABNORMAL LOW (ref 12.0–15.0)
Immature Granulocytes: 1 %
Lymphocytes Relative: 13 %
Lymphs Abs: 0.6 10*3/uL — ABNORMAL LOW (ref 0.7–4.0)
MCH: 27.8 pg (ref 26.0–34.0)
MCHC: 32.9 g/dL (ref 30.0–36.0)
MCV: 84.6 fL (ref 80.0–100.0)
Monocytes Absolute: 0.4 10*3/uL (ref 0.1–1.0)
Monocytes Relative: 9 %
Neutro Abs: 3.3 10*3/uL (ref 1.7–7.7)
Neutrophils Relative %: 75 %
Platelets: 153 10*3/uL (ref 150–400)
RBC: 2.66 MIL/uL — ABNORMAL LOW (ref 3.87–5.11)
RDW: 15.4 % (ref 11.5–15.5)
WBC: 4.4 10*3/uL (ref 4.0–10.5)
nRBC: 0 % (ref 0.0–0.2)

## 2021-08-31 LAB — URINALYSIS, MICROSCOPIC (REFLEX)

## 2021-08-31 LAB — TROPONIN I (HIGH SENSITIVITY)
Troponin I (High Sensitivity): 8 ng/L (ref <18)
Troponin I (High Sensitivity): 8 ng/L (ref <18)

## 2021-08-31 LAB — PREPARE RBC (CROSSMATCH)

## 2021-08-31 LAB — POC OCCULT BLOOD, ED: Fecal Occult Bld: POSITIVE — AB

## 2021-08-31 LAB — ABO/RH: ABO/RH(D): A POS

## 2021-08-31 MED ORDER — MIRTAZAPINE 15 MG PO TABS
7.5000 mg | ORAL_TABLET | Freq: Every day | ORAL | Status: DC
  Administered 2021-08-31 – 2021-09-01 (×2): 7.5 mg via ORAL
  Filled 2021-08-31 (×2): qty 1

## 2021-08-31 MED ORDER — SODIUM CHLORIDE 0.9 % IV SOLN: INTRAVENOUS | Status: DC

## 2021-08-31 MED ORDER — ALBUTEROL SULFATE (2.5 MG/3ML) 0.083% IN NEBU: 2.5000 mg | INHALATION_SOLUTION | Freq: Once | RESPIRATORY_TRACT | Status: DC

## 2021-08-31 MED ORDER — ONDANSETRON HCL 4 MG PO TABS: 4.0000 mg | ORAL_TABLET | Freq: Four times a day (QID) | ORAL | Status: DC | PRN

## 2021-08-31 MED ORDER — LETROZOLE 2.5 MG PO TABS
2.5000 mg | ORAL_TABLET | Freq: Every day | ORAL | Status: DC
  Administered 2021-09-02: 2.5 mg via ORAL
  Filled 2021-08-31 (×4): qty 1

## 2021-08-31 MED ORDER — SODIUM CHLORIDE 0.9% IV SOLUTION: Freq: Once | INTRAVENOUS | Status: AC

## 2021-08-31 MED ORDER — IPRATROPIUM-ALBUTEROL 0.5-2.5 (3) MG/3ML IN SOLN
3.0000 mL | Freq: Once | RESPIRATORY_TRACT | Status: AC
Start: 2021-08-31 — End: 2021-08-31
  Administered 2021-08-31: 3 mL via RESPIRATORY_TRACT
  Filled 2021-08-31: qty 3

## 2021-08-31 MED ORDER — FLUTICASONE FUROATE-VILANTEROL 200-25 MCG/ACT IN AEPB
1.0000 | INHALATION_SPRAY | Freq: Every day | RESPIRATORY_TRACT | Status: DC
  Administered 2021-09-01 – 2021-09-02 (×2): 1 via RESPIRATORY_TRACT
  Filled 2021-08-31: qty 28

## 2021-08-31 MED ORDER — ONDANSETRON 4 MG PO TBDP: 4.0000 mg | ORAL_TABLET | Freq: Three times a day (TID) | ORAL | Status: DC | PRN

## 2021-08-31 MED ORDER — PANTOPRAZOLE SODIUM 40 MG IV SOLR
40.0000 mg | Freq: Once | INTRAVENOUS | Status: AC
Start: 2021-08-31 — End: 2021-08-31
  Administered 2021-08-31: 40 mg via INTRAVENOUS
  Filled 2021-08-31: qty 40

## 2021-08-31 MED ORDER — SODIUM CHLORIDE 0.9 % IV BOLUS
1000.0000 mL | Freq: Once | INTRAVENOUS | Status: AC
  Administered 2021-08-31: 1000 mL via INTRAVENOUS

## 2021-08-31 MED ORDER — ALBUTEROL SULFATE (2.5 MG/3ML) 0.083% IN NEBU: 3.0000 mL | INHALATION_SOLUTION | Freq: Four times a day (QID) | RESPIRATORY_TRACT | Status: DC | PRN

## 2021-08-31 MED ORDER — LEVOTHYROXINE SODIUM 75 MCG PO TABS
75.0000 ug | ORAL_TABLET | Freq: Every day | ORAL | Status: DC
  Administered 2021-09-01 – 2021-09-02 (×2): 75 ug via ORAL
  Filled 2021-08-31 (×2): qty 1

## 2021-08-31 MED ORDER — LOSARTAN POTASSIUM 50 MG PO TABS
25.0000 mg | ORAL_TABLET | Freq: Every day | ORAL | Status: DC
  Administered 2021-09-02: 25 mg via ORAL
  Filled 2021-08-31 (×2): qty 1

## 2021-08-31 MED ORDER — ONDANSETRON HCL 4 MG/2ML IJ SOLN: 4.0000 mg | Freq: Four times a day (QID) | INTRAMUSCULAR | Status: DC | PRN

## 2021-08-31 MED ORDER — FLECAINIDE ACETATE 50 MG PO TABS
50.0000 mg | ORAL_TABLET | Freq: Two times a day (BID) | ORAL | Status: DC
  Administered 2021-08-31 – 2021-09-02 (×4): 50 mg via ORAL
  Filled 2021-08-31 (×4): qty 1

## 2021-08-31 MED ORDER — ATORVASTATIN CALCIUM 10 MG PO TABS
10.0000 mg | ORAL_TABLET | Freq: Every day | ORAL | Status: DC
  Administered 2021-09-01 – 2021-09-02 (×2): 10 mg via ORAL
  Filled 2021-08-31 (×2): qty 1

## 2021-08-31 MED ORDER — HYDRALAZINE HCL 25 MG PO TABS
100.0000 mg | ORAL_TABLET | Freq: Two times a day (BID) | ORAL | Status: DC
  Administered 2021-08-31 – 2021-09-02 (×4): 100 mg via ORAL
  Filled 2021-08-31 (×4): qty 4

## 2021-08-31 MED ORDER — BISOPROLOL FUMARATE 5 MG PO TABS
5.0000 mg | ORAL_TABLET | Freq: Every day | ORAL | Status: DC
  Administered 2021-09-01 – 2021-09-02 (×2): 5 mg via ORAL
  Filled 2021-08-31 (×2): qty 1

## 2021-08-31 NOTE — ED Provider Notes (Signed)
This patient is an elderly 88 year old female, she presents after having a cough that has been somewhat ongoing for a couple of months.  She was recently admitted to the hospital and stayed multiple days for treatment of respiratory illness, she was discharged home and had a difficult week, the second week was better however this last week has been worse again and she is becoming more weak and not eating.  Over the last 2 days has had very little appetite and now is having difficulty even walking because of severe with generalized weakness.  There is still a cough which gets worse when she tries to take lots of deep breaths or do lots of talking.  She denies fevers or chills, denies swelling of the legs.  On exam she does have a systolic heart murmur, she has no tachycardia, her lungs are otherwise clear with minimal expiratory wheezing, she does not appear to be in distress.  She does appear to be significantly generally weak   Noemi Chapel, MD 09/01/21 2227

## 2021-08-31 NOTE — ED Triage Notes (Signed)
Weakness with a dry cough, unable to eat for the past 2 days

## 2021-08-31 NOTE — ED Provider Notes (Signed)
[facility] EMERGENCY DEPARTMENT Provider Note   CSN: [id number] Arrival date & time: 08/31/21  1450     History  Chief Complaint  Patient presents with   Weakness    [name] is a 88 y.o. female with a history of recent bronchitis, chronic heart failure, paroxysmal A. fib, right renal arterial stenosis, chronic hypertension, chronic kidney disease stage III presents today due to weakness, dry cough, and loss of appetite over the last week and worsened over the last 2 days.  This is accompanied by the intermittent N/V and fatigue.  Patient's urine has been darker in color and less frequent.  She was recently prescribed Tessalon to help with her cough which she states has not helped.  She states the nebulizer treatments provided by pulmonology in the last week helped her symptoms.  In contrast patient does not believe that being at home albuterol treatments have helped, to which the daughter disagrees.  Patient's activity level and appetite have markedly decreased.  Last meal was Tuesday night and has only managed sips of water or Gatorade since then.  Patient denies chest pain, palpitations, dysuria, fever, chills, fainting, dizziness, abdominal pain.  Patient currently takes lisinopril.  She has a history of breast cancer.  The history is provided by the patient, a relative and medical records (Daughter).  Weakness Associated symptoms: cough, nausea and vomiting   Associated symptoms: no abdominal pain, no chest pain, no diarrhea, no dizziness, no dysuria, no fever, no frequency and no urgency       Home Medications Prior to Admission medications   Medication Sig Start Date End Date Taking? Authorizing Provider  acetaminophen (TYLENOL) 325 MG tablet Take 325 mg by mouth daily as needed for moderate pain or headache.    [provider]  albuterol (PROVENTIL) (2.5 MG/3ML) 0.083% nebulizer solution Take 3 mLs (2.5 mg total) by nebulization every 4 (four) hours as needed for  wheezing or shortness of breath. 08/08/21   Kathie Dike, MD  albuterol (VENTOLIN HFA) 108 (90 Base) MCG/ACT inhaler Inhale 2 puffs into the lungs every 6 (six) hours as needed for wheezing or shortness of breath. 08/08/21   Kathie Dike, MD  atorvastatin (LIPITOR) 20 MG tablet TAKE 1/2 TABLET BY MOUTH EVERY DAY 07/08/21   Ronnie Doss M, DO  azelastine (ASTELIN) 0.1 % nasal spray Place 1 spray into both nostrils 2 (two) times daily. 07/31/21   [name], DO  benzonatate (TESSALON) 200 MG capsule Take 1 capsule (200 mg total) by mouth 2 (two) times daily as needed for cough. 08/22/21   Chesley Mires, MD  bisoprolol (ZEBETA) 5 MG tablet Take 1 tablet (5 mg total) by mouth daily. 08/09/21   Kathie Dike, MD  budesonide-formoterol (SYMBICORT) 160-4.5 MCG/ACT inhaler Inhale 2 puffs into the lungs 2 (two) times daily. 08/08/21   Kathie Dike, MD  calcium-vitamin D (OSCAL WITH D) 500-200 MG-UNIT per tablet Take 2 tablets by mouth at bedtime.    [provider]  cholecalciferol (VITAMIN D) 1000 units tablet Take 1,000 Units by mouth daily.    [provider]  diclofenac Sodium (VOLTAREN) 1 % GEL Apply 2 g topically 4 (four) times daily. 02/22/21   Ivy Lynn, NP  ferrous sulfate 325 (65 FE) MG tablet Take 325 mg by mouth 2 (two) times daily with a meal.    [provider]  flecainide (TAMBOCOR) 50 MG tablet Take 1 tablet (50 mg total) by mouth 2 (two) times daily. 08/21/21  Minus Breeding, MD  fluticasone (FLONASE) 50 MCG/ACT nasal spray PLACE 1 SPRAY INTO BOTH NOSTRILS 2 (TWO) TIMES DAILY 08/09/21   Ronnie Doss M, DO  guaiFENesin (MUCINEX) 600 MG 12 hr tablet Take 1 tablet (600 mg total) by mouth 2 (two) times daily. 08/08/21   Kathie Dike, MD  hydrALAZINE (APRESOLINE) 100 MG tablet Take 100 mg by mouth 2 (two) times daily.    [provider]  hydroxypropyl methylcellulose / hypromellose (ISOPTO TEARS / GONIOVISC) 2.5 % ophthalmic  solution Place 1 drop into both eyes daily.    [provider]  letrozole [facility]) 2.5 MG tablet TAKE 1 TABLET BY MOUTH EVERY DAY 10/23/20   Derek Jack, MD  levothyroxine (SYNTHROID) 75 MCG tablet Take 1 tablet (75 mcg total) by mouth daily. 10/18/20   [name], DO  lisinopril (ZESTRIL) 20 MG tablet Take 1 tablet (20 mg total) by mouth daily. Per Dr Posey Pronto 01/2021 08/08/21   Kathie Dike, MD  mirtazapine (REMERON) 7.5 MG tablet Take 1 tablet (7.5 mg total) by mouth at bedtime. 07/25/21   Harvel Quale, MD  ondansetron (ZOFRAN ODT) 4 MG disintegrating tablet Take 1 tablet (4 mg total) by mouth every 8 (eight) hours as needed. 04/08/21   Gabriel Rung, NP  Spacer/Aero-Holding Chambers (PROCARE SPACER/ADULT MASK) DEVI Use as directed 08/08/21   Kathie Dike, MD      Allergies    Amlodipine, Xeroform occlusive gauze strip [bismuth tribromoph-petrolatum], Isosorbide nitrate, Sulfonamide derivatives, and Terazosin    Review of Systems   Review of Systems  Constitutional:  Positive for activity change, appetite change and fatigue. Negative for chills, diaphoresis and fever.  HENT:  Negative for congestion and rhinorrhea.        Trouble swallowing liquids/solids  Respiratory:  Positive for cough. Negative for apnea, chest tightness, wheezing and stridor.   Cardiovascular:  Negative for chest pain and palpitations.  Gastrointestinal:  Positive for constipation, nausea and vomiting. Negative for abdominal pain and diarrhea.       Nausea/Vomiting oriented around eating  Genitourinary:  Positive for decreased urine volume. Negative for difficulty urinating, dysuria, frequency and urgency.       Dark colored urine  Neurological:  Positive for weakness. Negative for dizziness, syncope and light-headedness.  Psychiatric/Behavioral:  Negative for confusion and decreased concentration.    Physical Exam Updated Vital Signs BP (!) 161/67    Pulse 65    Temp 98 F  (36.7 C) (Oral)    Resp 20    SpO2 95%  Physical Exam Vitals and nursing note reviewed.  Constitutional:      General: She is not in acute distress.    Appearance: She is well-developed and underweight.  HENT:     Head: Normocephalic and atraumatic.     Mouth/Throat:     Mouth: Mucous membranes are dry.  Eyes:     Conjunctiva/sclera: Conjunctivae normal.  Cardiovascular:     Rate and Rhythm: Normal rate and regular rhythm.     Pulses: Normal pulses.     Heart sounds: Murmur heard.  Pulmonary:     Effort: Pulmonary effort is normal. Tachypnea present. No accessory muscle usage or respiratory distress.     Breath sounds: Wheezing present. No decreased breath sounds.     Comments: Mild wheezing bilaterally Respirations 20-22 during physical exam Abdominal:     General: Bowel sounds are normal. There is no distension.     Palpations: Abdomen is soft.     Tenderness: There  is no abdominal tenderness. There is no guarding.  Musculoskeletal:        General: No swelling.     Cervical back: Neck supple.  Skin:    General: Skin is warm and dry.     Capillary Refill: Capillary refill takes less than 2 seconds.     Coloration: Skin is not cyanotic.  Neurological:     Mental Status: She is alert and oriented to person, place, and time.  Psychiatric:        Mood and Affect: Mood normal.        Behavior: Behavior is cooperative.    ED Results / Procedures / Treatments   Labs (all labs ordered are listed, but only abnormal results are displayed) Labs Reviewed - No data to display  EKG None  Radiology No results found.  Procedures Procedures    Medications Ordered in ED Medications - No data to display  ED Course/ Medical Decision Making/ A&P                           Medical Decision Making 88 year old female being assessed for dry cough, fatigue/weakness, failure to thrive over the last week.  DDx includes COPD with exacerbation, pneumonia, pulmonary embolism,  dehydration, myocardial infarction, chronic anemia.    I have personally interpreted all imaging, labs, and tests.    Physical exam and patient presentation not suggestive of myocardial infarction, but will get troponin to rule out.  She is not currently experiencing any chest pain, palpitations, or runs of atrial fibrillation. CXR not supportive of pneumonia or other acute pulmonary pathology, but is suggestive of COPD.  Patient chronically hypertensive.  Heart rate, symptoms, physical exam do not endorse pulmonary embolism.  Reportedly dark urine, dry mucous membranes, repeated vomiting, low fluid and solid intake, and skin appearance suggest dehydration.  Sodium, chloride, RBC count, hemoglobin all low.  Giving IV fluids to help replenish fluid loss.  Looking to perform Hemoccult identify possible explanation for low hemoglobin.  I will reassess after troponin and Hemoccult come back, and patient finishes her IV fluids.    Respirations noted as 22 on physical exam which does not correlate with 34 recorded at 1530.  Hemoccult positive.  Troponin negative.  Believe it is best to admit her to identify the source of bleeding, monitor her kidney function and hemoglobin, and correct her electrolyte levels.  Will contact Dr. Nehemiah Settle of internal medicine to admit the patient.  Thoroughly discussed treatment course with patient and her daughter.  They are in agreement and have no further questions.    Amount and/or Complexity of Data Reviewed Labs: ordered. Decision-making details documented in ED Course. Radiology: ordered and independent interpretation performed. Decision-making details documented in ED Course. ECG/medicine tests: ordered and independent interpretation performed. Decision-making details documented in ED Course.         Final Clinical Impression(s) / ED Diagnoses Final diagnoses:  None    Rx / DC Orders ED Discharge Orders     None         Candace Cruise 30/09/23 0102    Noemi Chapel, MD 09/01/21 2227

## 2021-08-31 NOTE — H&P (Signed)
History and Physical  [name] XNA:[id number] DOB: 1933/08/23 DOA: 08/31/2021  Referring physician: Dr Sabra Heck, ED physician PCP: [name], DO  Outpatient Specialists:   Patient Coming From: Home  Chief Complaint: Fatigue, weakness, decreased appetite  HPI: [name] is a 89 y.o. female with a history of paroxysmal atrial fibrillation, history of breast cancer s/p lumpectomy and hormone therapy with no recurrence, hypertension, stage III chronic kidney disease, hypothyroidism.  Patient has been having diminished appetite over the past several weeks and has been on Remeron.  This has not been helping too much.  The patient has had very little to eat or drink since Tuesday.  She will try to eat a few bites but be unable to eat much.  No palliating or provoking factors.  She is becoming more more fatigued.  Due to the ongoing cyst numbness, she came here for evaluation.  Her stool has been dark color for the past several days  Emergency Department Course: Hemoglobin 7.4.  It does appear that this has been declining over the past several months.  Back in September, her hemoglobin was normal.  Her sodium is 129 creatinine is 2.0 with a baseline of 1.5.  Hemoccult was positive.  Review of Systems:   Pt denies any fevers, chills, nausea, vomiting, diarrhea, constipation, abdominal pain, shortness of breath, dyspnea on exertion, orthopnea, cough, wheezing, palpitations, headache, vision changes, lightheadedness, dizziness, melena, rectal bleeding.  Review of systems are otherwise negative  Past Medical History:  Diagnosis Date   Anemia    Arthritis    Asthma    Atrial fibrillation ([place])    Basal cell carcinoma 02/06/2009   Left ear targus- ([facility])   Basal cell carcinoma 09/28/2008   Right back-(CX35FU)   CKD (chronic kidney disease), stage III (HCC)    Heart murmur    Hyperlipidemia    Hypertension    Hypothyroidism    Nodule of right lung    Right upper lobe   PONV  (postoperative nausea and vomiting)    Renal insufficiency    Chronic   Renal vascular disease    Right renal artery stenosis ([place]) 05/09/2015   Squamous cell carcinoma of skin 09/07/2019   KA-Left shin-txpbx   Vertigo    Past Surgical History:  Procedure Laterality Date   BIOPSY  04/09/2021   Procedure: BIOPSY;  Surgeon: Harvel Quale, MD;  [facility];  Service: Gastroenterology;;   BREAST LUMPECTOMY WITH RADIOACTIVE SEED LOCALIZATION Right 04/08/2018   Procedure: BREAST LUMPECTOMY WITH RADIOACTIVE SEED LOCALIZATION;  Surgeon: Rolm Bookbinder, MD;  Location: [facility];  Service: General;  Laterality: Right;   COLONOSCOPY  2016   diverticulosis, hemorrhoids   ESOPHAGOGASTRODUODENOSCOPY  2016   normal   ESOPHAGOGASTRODUODENOSCOPY (EGD) WITH PROPOFOL N/A 04/09/2021   Procedure: ESOPHAGOGASTRODUODENOSCOPY (EGD) WITH PROPOFOL;  Surgeon: Harvel Quale, MD;  Location: [facility];  Service: Gastroenterology;  Laterality: N/A;  12:45   IR GENERIC HISTORICAL  08/29/2016   IR US GUIDE VASC ACCESS RIGHT 08/29/2016 [facility]   IR GENERIC HISTORICAL  08/29/2016   IR RENAL BILAT S&I MOD SED 08/29/2016 [facility]   KNEE ARTHROSCOPY Right    2019   THYROIDECTOMY, PARTIAL Right 1981   middle lobe removed 1st, then right lobe removed 7-8 years later (approx. 70)   Social History:  reports that she has never smoked. She has never used smokeless tobacco. She reports that she does not drink alcohol and does not use drugs. Patient  lives at home  Allergies  Allergen Reactions   Amlodipine Other (See Comments)    edema   Xeroform Occlusive Gauze Strip [Bismuth Tribromoph-Petrolatum]    Isosorbide Nitrate Nausea Only   Sulfonamide Derivatives Nausea Only and Rash   Terazosin Hives and Nausea Only    Family History  Problem Relation Age of Onset   Thyroid disease Mother    Stroke Father    Hypertension Brother    Lung cancer Maternal Uncle    Cancer  Maternal Grandmother        Liver   Lung cancer Maternal Uncle    Hypertension Daughter    Hypercholesterolemia Daughter    Hypercholesterolemia Daughter       Prior to Admission medications   Medication Sig Start Date End Date Taking? Authorizing Provider  acetaminophen (TYLENOL) 325 MG tablet Take 325 mg by mouth daily as needed for moderate pain or headache.   Yes [provider]  albuterol (PROVENTIL) (2.5 MG/3ML) 0.083% nebulizer solution Take 3 mLs (2.5 mg total) by nebulization every 4 (four) hours as needed for wheezing or shortness of breath. 08/08/21  Yes Kathie Dike, MD  albuterol (VENTOLIN HFA) 108 (90 Base) MCG/ACT inhaler Inhale 2 puffs into the lungs every 6 (six) hours as needed for wheezing or shortness of breath. 08/08/21  Yes Kathie Dike, MD  atorvastatin (LIPITOR) 20 MG tablet TAKE 1/2 TABLET BY MOUTH EVERY DAY 07/08/21  Yes Gottschalk, Ashly M, DO  azelastine (ASTELIN) 0.1 % nasal spray Place 1 spray into both nostrils 2 (two) times daily. 07/31/21  Yes Gottschalk, Ashly M, DO  benzonatate (TESSALON) 200 MG capsule Take 1 capsule (200 mg total) by mouth 2 (two) times daily as needed for cough. 08/22/21  Yes Chesley Mires, MD  bisoprolol (ZEBETA) 5 MG tablet Take 1 tablet (5 mg total) by mouth daily. 08/09/21  Yes Kathie Dike, MD  budesonide-formoterol (SYMBICORT) 160-4.5 MCG/ACT inhaler Inhale 2 puffs into the lungs 2 (two) times daily. 08/08/21  Yes Kathie Dike, MD  cholecalciferol (VITAMIN D) 1000 units tablet Take 1,000 Units by mouth daily.   Yes [provider]  diclofenac Sodium (VOLTAREN) 1 % GEL Apply 2 g topically 4 (four) times daily. 02/22/21  Yes Ivy Lynn, NP  flecainide (TAMBOCOR) 50 MG tablet Take 1 tablet (50 mg total) by mouth 2 (two) times daily. 08/21/21  Yes Minus Breeding, MD  fluticasone (FLONASE) 50 MCG/ACT nasal spray PLACE 1 SPRAY INTO BOTH NOSTRILS 2 (TWO) TIMES DAILY 08/09/21  Yes Ronnie Doss M, DO   hydrALAZINE (APRESOLINE) 100 MG tablet Take 100 mg by mouth 2 (two) times daily.   Yes [provider]  hydroxypropyl methylcellulose / hypromellose (ISOPTO TEARS / GONIOVISC) 2.5 % ophthalmic solution Place 1 drop into both eyes daily.   Yes [provider]  letrozole (FEMARA) 2.5 MG tablet TAKE 1 TABLET BY MOUTH EVERY DAY 10/23/20  Yes Derek Jack, MD  levothyroxine (SYNTHROID) 75 MCG tablet Take 1 tablet (75 mcg total) by mouth daily. 10/18/20  Yes Gottschalk, Leatrice Jewels M, DO  lisinopril (ZESTRIL) 20 MG tablet Take 1 tablet (20 mg total) by mouth daily. Per Dr Posey Pronto 01/2021 08/08/21  Yes Kathie Dike, MD  mirtazapine (REMERON) 7.5 MG tablet Take 1 tablet (7.5 mg total) by mouth at bedtime. 07/25/21  Yes Harvel Quale, MD  ondansetron (ZOFRAN ODT) 4 MG disintegrating tablet Take 1 tablet (4 mg total) by mouth every 8 (eight) hours as needed. 04/08/21  Yes Carlan, Deatra Robinson, NP  calcium-vitamin D (OSCAL WITH D) 500-200 MG-UNIT per tablet Take 2 tablets by mouth at bedtime. Patient not taking: Reported on 08/31/2021    [provider]  ferrous sulfate 325 (65 FE) MG tablet Take 325 mg by mouth 2 (two) times daily with a meal. Patient not taking: Reported on 08/31/2021    [provider]  guaiFENesin (MUCINEX) 600 MG 12 hr tablet Take 1 tablet (600 mg total) by mouth 2 (two) times daily. Patient not taking: Reported on 08/31/2021 08/08/21   Kathie Dike, MD  Spacer/Aero-Holding Chambers (PROCARE SPACER/ADULT MASK) DEVI Use as directed 08/08/21   Kathie Dike, MD    Physical Exam: BP (!) 170/65    Pulse 68    Temp 98 F (36.7 C) (Oral)    Resp (!) 30    SpO2 94%   General: Early female. Awake and alert and oriented x3. No acute cardiopulmonary distress.  HEENT: Normocephalic atraumatic.  Right and left ears normal in appearance.  Pupils equal, round, reactive to light. Extraocular muscles are intact. Sclerae anicteric and noninjected.  Moist  mucosal membranes. No mucosal lesions.  Neck: Neck supple without lymphadenopathy. No carotid bruits. No masses palpated.  Cardiovascular: Regular rate with normal S1-S2 sounds.  4 out of 6 systolic ejection murmur. no rubs, gallops auscultated. No JVD.  Respiratory: Good respiratory effort with no wheezes, rales, rhonchi. Lungs clear to auscultation bilaterally.  No accessory muscle use. Abdomen: Soft, nontender, nondistended. Active bowel sounds. No masses or hepatosplenomegaly  Skin: No rashes, lesions, or ulcerations.  Dry, warm to touch. 2+ dorsalis pedis and radial pulses. Musculoskeletal: No calf or leg pain. All major joints not erythematous nontender.  No upper or lower joint deformation.  Good ROM.  No contractures  Psychiatric: Intact judgment and insight. Pleasant and cooperative. Neurologic: No focal neurological deficits. Strength is 5/5 and symmetric in upper and lower extremities.  Cranial nerves II through XII are grossly intact.           Labs on Admission: I have personally reviewed following labs and imaging studies  CBC: Recent Labs  Lab 08/31/21 1600  WBC 4.4  NEUTROABS 3.3  HGB 7.4*  HCT 22.5*  MCV 84.6  PLT 480   Basic Metabolic Panel: Recent Labs  Lab 08/31/21 1600  NA 129*  K 4.2  CL 92*  CO2 27  GLUCOSE 116*  BUN 23  CREATININE 2.00*  CALCIUM 8.9   GFR: Estimated Creatinine Clearance: 15.7 mL/min (A) (by C-G formula based on SCr of 2 mg/dL (H)). Liver Function Tests: Recent Labs  Lab 08/31/21 1600  AST 16  ALT 12  ALKPHOS 54  BILITOT 0.6  PROT 7.0  ALBUMIN 3.2*   No results for input(s): LIPASE, AMYLASE in the last 168 hours. No results for input(s): AMMONIA in the last 168 hours. Coagulation Profile: No results for input(s): INR, PROTIME in the last 168 hours. Cardiac Enzymes: No results for input(s): CKTOTAL, CKMB, CKMBINDEX, TROPONINI in the last 168 hours. BNP (last 3 results) No results for input(s): PROBNP in the last 8760  hours. HbA1C: No results for input(s): HGBA1C in the last 72 hours. CBG: No results for input(s): GLUCAP in the last 168 hours. Lipid Profile: No results for input(s): CHOL, HDL, LDLCALC, TRIG, CHOLHDL, LDLDIRECT in the last 72 hours. Thyroid Function Tests: No results for input(s): TSH, T4TOTAL, FREET4, T3FREE, THYROIDAB in the last 72 hours. Anemia Panel: No results for input(s): VITAMINB12, FOLATE, FERRITIN, TIBC, IRON, RETICCTPCT in the last 72 hours.  Urine analysis:    Component Value Date/Time   COLORURINE YELLOW 08/31/2021 1556   APPEARANCEUR CLEAR 08/31/2021 1556   APPEARANCEUR Clear 10/11/2019 1326   LABSPEC 1.010 08/31/2021 1556   PHURINE 7.5 08/31/2021 1556   GLUCOSEU NEGATIVE 08/31/2021 1556   HGBUR TRACE (A) 08/31/2021 1556   BILIRUBINUR NEGATIVE 08/31/2021 1556   BILIRUBINUR Negative 10/11/2019 1326   KETONESUR NEGATIVE 08/31/2021 1556   PROTEINUR TRACE (A) 08/31/2021 1556   UROBILINOGEN 0.2 11/27/2007 1051   NITRITE NEGATIVE 08/31/2021 1556   LEUKOCYTESUR NEGATIVE 08/31/2021 1556   Sepsis Labs: @LABRCNTIP (procalcitonin:4,lacticidven:4) )No results found for this or any previous visit (from the past 240 hour(s)).   Radiological Exams on Admission: DG Chest 2 View  Result Date: 08/31/2021 CLINICAL DATA:  Nonproductive cough, wheezing, shortness of breath for several days. EXAM: CHEST - 2 VIEW COMPARISON:  08/03/2021 FINDINGS: Stable cardiomegaly. Aortic atherosclerotic calcification noted. Stable pulmonary hyperinflation and chronic interstitial prominence, consistent with COPD. No evidence of pulmonary infiltrate or edema. No evidence of pleural effusion. IMPRESSION: Stable cardiomegaly and COPD. No active lung disease. Electronically Signed   By: Marlaine Hind M.D.   On: 08/31/2021 16:44    EKG: Independently reviewed.  Sinus rhythm.  RSR prime in V1 and V2.  Some peaked T waves in the inferior leads.  No acute ST changes.  Assessment/Plan: Principal Problem:    Symptomatic anemia Active Problems:   Essential hypertension   Paroxysmal atrial fibrillation (HCC)   Chronic diastolic heart failure (HCC)   Hyponatremia   Hypothyroidism   Acute-on-chronic kidney injury ([place])   Dry cough   Upper GI bleed   Failure to thrive in adult    This patient was discussed with the ED physician, including pertinent vitals, physical exam findings, labs, and imaging.  We also discussed care given by the ED provider.  Symptomatic anemia Admits Give 2 units of blood Patient consented Check CBC in the morning GI to consult Upper GI bleed Start Protonix Failure to thrive on adult Clear liquids for now.   Patient currently on Remeron 7.5 mg at bedtime.  If the decreased appetite does not improve with resolution of of GI bleed, can increase Remeron to 10 or 15 mg. Acute on chronic kidney injury IVF overnight. Should improve with improved volume status after blood transfusion Check Cr in AM Dry cough ? Secondary to lisinopril Change lisinopril to cozaar Hypertension Continue home regimen Chronic diastolic heart failure Paroxysmal atrial fibrillation Currently in sinus rhythm  DVT prophylaxis: SCD Consultants: GI Code Status: DNR Family Communication: Daughter present  Disposition Plan: pending   Truett Mainland, DO

## 2021-09-01 ENCOUNTER — Encounter (HOSPITAL_COMMUNITY): Payer: Self-pay | Admitting: Family Medicine

## 2021-09-01 ENCOUNTER — Encounter (HOSPITAL_COMMUNITY)
Admission: EM | Disposition: A | Discharge status: Home / Self Care | Payer: Self-pay | Source: Home / Self Care | Attending: Internal Medicine

## 2021-09-01 ENCOUNTER — Inpatient Hospital Stay (HOSPITAL_COMMUNITY): Payer: PPO | Admitting: Anesthesiology

## 2021-09-01 ENCOUNTER — Other Ambulatory Visit: Payer: Self-pay

## 2021-09-01 DIAGNOSIS — R627 Adult failure to thrive: Secondary | ICD-10-CM

## 2021-09-01 DIAGNOSIS — R6881 Early satiety: Secondary | ICD-10-CM

## 2021-09-01 DIAGNOSIS — K922 Gastrointestinal hemorrhage, unspecified: Secondary | ICD-10-CM

## 2021-09-01 DIAGNOSIS — D649 Anemia, unspecified: Secondary | ICD-10-CM

## 2021-09-01 DIAGNOSIS — R634 Abnormal weight loss: Secondary | ICD-10-CM

## 2021-09-01 HISTORY — PX: ESOPHAGOGASTRODUODENOSCOPY (EGD) WITH PROPOFOL: SHX5813

## 2021-09-01 LAB — BASIC METABOLIC PANEL
Anion gap: 9 (ref 5–15)
BUN: 19 mg/dL (ref 8–23)
CO2: 26 mmol/L (ref 22–32)
Calcium: 8.3 mg/dL — ABNORMAL LOW (ref 8.9–10.3)
Chloride: 95 mmol/L — ABNORMAL LOW (ref 98–111)
Creatinine, Ser: 1.74 mg/dL — ABNORMAL HIGH (ref 0.44–1.00)
GFR, Estimated: 28 mL/min — ABNORMAL LOW (ref >60)
Glucose, Bld: 103 mg/dL — ABNORMAL HIGH (ref 70–99)
Potassium: 3.7 mmol/L (ref 3.5–5.1)
Sodium: 130 mmol/L — ABNORMAL LOW (ref 135–145)

## 2021-09-01 LAB — CBC
HCT: 30.1 % — ABNORMAL LOW (ref 36.0–46.0)
Hemoglobin: 10.1 g/dL — ABNORMAL LOW (ref 12.0–15.0)
MCH: 28.9 pg (ref 26.0–34.0)
MCHC: 33.6 g/dL (ref 30.0–36.0)
MCV: 86 fL (ref 80.0–100.0)
Platelets: 140 10*3/uL — ABNORMAL LOW (ref 150–400)
RBC: 3.5 MIL/uL — ABNORMAL LOW (ref 3.87–5.11)
RDW: 15 % (ref 11.5–15.5)
WBC: 5 10*3/uL (ref 4.0–10.5)
nRBC: 0 % (ref 0.0–0.2)

## 2021-09-01 LAB — TSH: TSH: 1.584 u[IU]/mL (ref 0.350–4.500)

## 2021-09-01 SURGERY — ESOPHAGOGASTRODUODENOSCOPY (EGD) WITH PROPOFOL
Anesthesia: General

## 2021-09-01 MED ORDER — PANTOPRAZOLE SODIUM 40 MG IV SOLR
40.0000 mg | Freq: Two times a day (BID) | INTRAVENOUS | Status: DC
  Administered 2021-09-01 – 2021-09-02 (×3): 40 mg via INTRAVENOUS
  Filled 2021-09-01 (×3): qty 40

## 2021-09-01 MED ORDER — LACTATED RINGERS IV SOLN: INTRAVENOUS | Status: DC | PRN

## 2021-09-01 MED ORDER — PROPOFOL 10 MG/ML IV BOLUS
INTRAVENOUS | Status: DC | PRN
  Administered 2021-09-01: 50 mg via INTRAVENOUS

## 2021-09-01 MED ORDER — SODIUM CHLORIDE 0.9 % IV SOLN: INTRAVENOUS | Status: DC

## 2021-09-01 MED ORDER — PROPOFOL 10 MG/ML IV BOLUS
INTRAVENOUS | Status: AC
  Filled 2021-09-01: qty 20

## 2021-09-01 MED ORDER — SODIUM CHLORIDE 0.9 % IV SOLN: INTRAVENOUS | Status: AC

## 2021-09-01 NOTE — TOC Initial Note (Signed)
Transition of Care [facility]) - Initial/Assessment Note    Patient Details  Name: [name] MRN: [id number] Date of Birth: [birth year]  Transition of Care [facility]) CM/SW Contact:    Janet Salen, RN Phone Number: 09/01/2021, 3:46 PM  Clinical Narrative:  TOCRN spoke with patient, Daughter [name] at bedside. Patient alert and oriented x4, reports living alone, independent with ADL's, [facility] [place] [facility] and shopping when needed. Denies use of assisted device, no HHS, used a Nebulizer for Bronchitis, however no longer using. Patient and Daughter verbalizes no need for health care services in the home. TOC to continue to track until discharge.                Expected Discharge Plan: Home/Self Care Barriers to Discharge: Continued Medical Work up   Patient Goals and CMS Choice Patient states their goals for this hospitalization and ongoing recovery are:: To return home.      Expected Discharge Plan and Services Expected Discharge Plan: Home/Self Care       Living arrangements for the past 2 months: Single Family Home                                      Prior Living Arrangements/Services Living arrangements for the past 2 months: Single Family Home Lives with:: Self Patient language and need for interpreter reviewed:: Yes Do you feel safe going back to the place where you live?: Yes      Need for Family Participation in Patient Care: No (Comment) Care giver support system in place?: Yes (comment) (Daughter [name] at bedside.)   Criminal Activity/Legal Involvement Pertinent to Current Situation/Hospitalization: No - Comment as needed  Activities of Daily Living Home Assistive Devices/Equipment: None ADL Screening (condition at time of admission) Patient's cognitive ability adequate to safely complete daily activities?: Yes Is the patient deaf or have difficulty hearing?: No Does the patient have difficulty seeing, even when wearing  glasses/contacts?: No Does the patient have difficulty concentrating, remembering, or making decisions?: No Patient able to express need for assistance with ADLs?: Yes Does the patient have difficulty dressing or bathing?: No Independently performs ADLs?: Yes (appropriate for developmental age) Communication: Independent Dressing (OT): Independent Grooming: Independent Feeding: Independent Bathing: Independent Toileting: Independent In/Out Bed: Independent Walks in Home: Independent Does the patient have difficulty walking or climbing stairs?: No Weakness of Legs: None Weakness of Arms/Hands: None  Permission Sought/Granted                  Emotional Assessment Appearance:: Appears stated age Attitude/Demeanor/Rapport: Engaged Affect (typically observed): Calm Orientation: : Oriented to Self, Oriented to Place, Oriented to  Time, Oriented to Situation Alcohol / Substance Use: Not Applicable Psych Involvement: No (comment)  Admission diagnosis:  Weakness [R53.1] UGI bleed [K92.2] Symptomatic anemia [D64.9] Anemia, unspecified type [D64.9] Patient Active Problem List   Diagnosis Date Noted   Loss of weight    Early satiety    Symptomatic anemia 08/31/2021   Acute-on-chronic kidney injury ([place]) 08/31/2021   Dry cough 08/31/2021   UGI bleed 08/31/2021   Failure to thrive in adult 08/31/2021   Mitral valve insufficiency 08/20/2021   History of right breast cancer    Malnutrition of moderate degree 08/03/2021   Acute respiratory failure with hypoxia ([place]) 08/01/2021   Acute bronchitis 08/01/2021   Decreased appetite 07/25/2021   Protein-calorie malnutrition ([place]) 07/25/2021   Skin  tear of right lower leg without complication 86/76/1950   Non-intractable vomiting 04/08/2021   Edema 02/22/2021   Constipation 01/23/2021   Osteoarthritis of knee 12/13/2020   History of arthroscopy of knee 12/13/2020   Pain in joint of left knee 12/13/2020   Atrial tachycardia ([place])  07/26/2019   Aortic valve sclerosis 07/26/2019   Aortic valve stenosis 12/01/2018   Breast cancer, right ([place]) 04/08/2018   Malignant neoplasm of lower-outer quadrant of right breast of female, estrogen receptor positive ([place]) 02/25/2018   Tear of lateral meniscus of knee 10/27/2017   Vertigo 09/09/2016   Renal vascular disease 10/31/2015   Nodule of right lung 10/31/2015   Asthma without status asthmaticus 10/31/2015   Essential hypertension, malignant    Hypothyroidism 10/15/2015   Pancytopenia ([place]) 10/15/2015   Hyponatremia 10/14/2015   Thrombocytopenia ([place]) 06/16/2015   Right renal artery stenosis ([place]) 05/09/2015   Chronic diastolic heart failure ([place]) 04/03/2014   CKD (chronic kidney disease) stage 3, GFR 30-59 ml/min (HCC) 04/02/2014   Paroxysmal atrial fibrillation ([place]) 01/25/2014   Normocytic anemia 07/19/2013   Bilateral lower extremity edema 06/30/2013   Hypertension with fluid overload 06/30/2013   Varicose veins of lower extremities with other complications 93/26/7124   ABNORMAL STRESS ELECTROCARDIOGRAM 01/23/2010   Hyperlipidemia 12/18/2009   Essential hypertension 12/18/2009   RENAL INSUFFICIENCY, CHRONIC 12/18/2009   Arthropathy 12/18/2009   PCP:  [name], DO Pharmacy:   [id number] [place], [place] [address] Phone: [phone] Fax: [phone]     Social Determinants of Health (SDOH) Interventions    Readmission Risk Interventions Readmission Risk Prevention Plan 09/01/2021 08/05/2021  Transportation Screening Complete Complete  PCP or Specialist Appt within 3-5 Days Complete -  HRI or Home Care Consult Complete Complete  Social Work Consult for [place] Planning/Counseling Complete Complete  Palliative Care Screening Not Applicable Not Applicable  Medication Review Press photographer) Complete Complete  Some recent data might be hidden

## 2021-09-01 NOTE — Op Note (Signed)
[facility] Patient Name: [name] Procedure Date: 09/01/2021 10:59 AM MRN: [id number] Date of Birth: 1934-07-11 Attending MD: Elon Alas. Abbey Chatters DO CSN: [id number] Age: 88 Admit Type: Inpatient Procedure:                Upper GI endoscopy Indications:              Iron deficiency anemia, Melena, Early satiety,                            Weight loss Providers:                Elon Alas. Abbey Chatters, DO, Lurline Del, RN, Wynonia Musty Tech, Technician Referring MD:              Medicines:                See the Anesthesia note for documentation of the                            administered medications Complications:            No immediate complications. Estimated Blood Loss:     Estimated blood loss was minimal. Procedure:                Pre-Anesthesia Assessment:                           - The anesthesia plan was to use monitored                            anesthesia care (MAC).                           After obtaining informed consent, the endoscope was                            passed under direct vision. Throughout the                            procedure, the patient's blood pressure, pulse, and                            oxygen saturations were monitored continuously. The                            [id number]-[id number] ([id number]) scope was introduced through the                            mouth, and advanced to the second part of duodenum.                            The upper GI endoscopy was accomplished without                            difficulty. The patient tolerated the  procedure                            well. Scope In: 12:05:14 PM Scope Out: 12:09:44 PM Total Procedure Duration: 0 hours 4 minutes 30 seconds  Findings:      Localized, white plaques were found in the middle third of the       esophagus. Biopsies were taken with a cold forceps for histology.      Localized mild inflammation characterized by erythema was found in the        gastric antrum. Biopsies were taken with a cold forceps for Helicobacter       pylori testing.      Diffuse mucosal variance characterized by pseudomelanosis duodeni was       found in the first portion of the duodenum and in the second portion of       the duodenum.      One non-bleeding cratered clean based duodenal ulcer with no stigmata of       bleeding was found in the first portion of the duodenum. The lesion was       5 mm in largest dimension.      A mild Schatzki ring was found in the distal esophagus. Impression:               - Esophageal plaques were found, suspicious for                            candidiasis. Biopsied.                           - Gastritis. Biopsied.                           - Mucosal variant in the duodenum.                           - Non-bleeding duodenal ulcer with no stigmata of                            bleeding.                           - Mild Schatzki ring. Moderate Sedation:      Per Anesthesia Care Recommendation:           - Return patient to hospital ward for ongoing care.                           - Patient with 5 mm ulcer noted in the first                            portion of duodenum, clean base, no active or                            stigmata of bleeding.                           Recommend IV PPI twice daily while inpatient. Will  need to be on p.o. PPI twice daily for 12 weeks                            upon discharge. Avoid NSAIDs. Okay to start on soft                            diet today. Continue to monitor H&H and transfuse                            for less than 7. I will call and update patient's                            daughter Elisa. I will follow up on biosies to rule                            out candidal esophagitis and H pylori and treat if                            indicated Procedure Code(s):        --- Professional ---                           [phone], Esophagogastroduodenoscopy,  flexible,                            transoral; with biopsy, single or multiple Diagnosis Code(s):        --- Professional ---                           K22.9, Disease of esophagus, unspecified                           K29.70, Gastritis, unspecified, without bleeding                           K31.89, Other diseases of stomach and duodenum                           K26.9, Duodenal ulcer, unspecified as acute or                            chronic, without hemorrhage or perforation                           D50.9, Iron deficiency anemia, unspecified                           K92.1, Melena (includes Hematochezia)                           R68.81, Early satiety                           R63.4, Abnormal weight loss CPT copyright 2019 American Medical [employer]. All rights reserved. The codes documented in  this report are preliminary and upon coder review may  be revised to meet current compliance requirements. Elon Alas. Abbey Chatters, DO [place] Abbey Chatters, DO 09/01/2021 12:39:37 PM This report has been signed electronically. Number of Addenda: 0

## 2021-09-01 NOTE — Anesthesia Preprocedure Evaluation (Signed)
Anesthesia Evaluation  Patient identified by MRN, date of birth, ID band Patient awake    Reviewed: Allergy & Precautions, H&P , NPO status , Patient's Chart, lab work & pertinent test results, reviewed documented beta blocker date and time   History of Anesthesia Complications (+) PONV and history of anesthetic complications  Airway Mallampati: II  TM Distance: >3 FB Neck ROM: full    Dental no notable dental hx. (+) Dental Advisory Given   Pulmonary asthma ,    Pulmonary exam normal breath sounds clear to auscultation       Cardiovascular Exercise Tolerance: Good hypertension, Pt. on medications and Pt. on home beta blockers + Peripheral Vascular Disease  Normal cardiovascular exam(-) dysrhythmias Atrial Fibrillation + Valvular Problems/Murmurs AS  Rhythm:regular Rate:Normal     Neuro/Psych negative neurological ROS  negative psych ROS   GI/Hepatic negative GI ROS, Neg liver ROS, Medicated and Poorly Controlled,  Endo/Other  Hypothyroidism   Renal/GU Renal Insufficiency and CRFRenal disease  negative genitourinary   Musculoskeletal  (+) Arthritis ,   Abdominal   Peds  Hematology  (+) Blood dyscrasia, anemia ,   Anesthesia Other Findings 1. Left ventricular ejection fraction, by estimation, is 65 to 70%. The  left ventricle has normal function. The left ventricle has no regional  wall motion abnormalities. There is mild left ventricular hypertrophy.  Left ventricular diastolic parameters  are consistent with Grade II diastolic dysfunction (pseudonormalization).  2. Right ventricular systolic function is normal. The right ventricular  size is normal. There is mildly elevated pulmonary artery systolic  pressure. The estimated right ventricular systolic pressure is 52.7 mmHg.  3. Left atrial size was moderately dilated.  4. Right atrial size was moderately dilated.  5. The mitral valve is degenerative. Mild  mitral valve regurgitation.  6. The aortic valve is tricuspid. There is moderate calcification of the  aortic valve. Aortic valve regurgitation is not visualized. Moderate  aortic valve stenosis. Aortic valve mean gradient measures 27.7 mmHg.  Aortic valve Vmax measures 3.52 m/s.  Dimentionless index 0.50.  7. The inferior vena cava is normal in size with greater than 50%  respiratory variability, suggesting right atrial pressure of 3 mmHg.  Reproductive/Obstetrics negative OB ROS                             Anesthesia Physical  Anesthesia Plan  ASA: 4 and emergent  Anesthesia Plan: General   Post-op Pain Management:    Induction: Intravenous  PONV Risk Score and Plan: Propofol infusion  Airway Management Planned: Nasal Cannula and Natural Airway  Additional Equipment:   Intra-op Plan:   Post-operative Plan:   Informed Consent: I have reviewed the patients History and Physical, chart, labs and discussed the procedure including the risks, benefits and alternatives for the proposed anesthesia with the patient or authorized representative who has indicated his/her understanding and acceptance.     Dental Advisory Given  Plan Discussed with: CRNA  Anesthesia Plan Comments:         Anesthesia Quick Evaluation

## 2021-09-01 NOTE — Consult Note (Signed)
Consulting  Provider: Dr. Nehemiah Settle Primary Care Physician:  [name], DO Primary Gastroenterologist:  Dr. Jenetta Downer  Reason for Consultation: Symptomatic anemia, suspected upper GI bleed, weight loss, early satiety  HPI:  [name] is a 88 y.o. female with a past medical history of paroxysmal atrial fibrillation, hypertension, chronic kidney disease, hypothyroidism, esophageal candidiasis status posttreatment with Diflucan x21 days and August 2022, who presented to [facility] ER yesterday with chief complaint of generalized weakness/fatigue, early satiety, weight loss.  Patient states for the last several weeks to months she has not been eating very well.  States she will get full very quickly.  Even with liquids.  Some nausea.  No dysphagia odynophagia.  No abdominal pain.  Does have dark stools though states she takes iron.  She had an EGD in August 2022 which showed evidence of esophageal candidiasis, biopsies confirm this.  She was treated with 21 days of Diflucan and states her symptoms improved.  At that time she was mainly having nausea and vomiting.  Evaluation in the ER, hemoglobin 7.4, now 10.1 after receiving PRBCs.  Has been started on IV Protonix.  Past Medical History:  Diagnosis Date   Anemia    Arthritis    Asthma    Atrial fibrillation ([place])    Basal cell carcinoma 02/06/2009   Left ear targus- ([facility])   Basal cell carcinoma 09/28/2008   Right back-([id number])   CKD (chronic kidney disease), stage III (HCC)    Heart murmur    Hyperlipidemia    Hypertension    Hypothyroidism    Nodule of right lung    Right upper lobe   PONV (postoperative nausea and vomiting)    Renal insufficiency    Chronic   Renal vascular disease    Right renal artery stenosis ([place]) 05/09/2015   Squamous cell carcinoma of skin 09/07/2019   KA-Left shin-txpbx   Vertigo     Past Surgical History:  Procedure Laterality Date   BIOPSY  04/09/2021   Procedure: BIOPSY;  Surgeon:  Harvel Quale, MD;  [facility];  Service: Gastroenterology;;   BREAST LUMPECTOMY WITH RADIOACTIVE SEED LOCALIZATION Right 04/08/2018   Procedure: BREAST LUMPECTOMY WITH RADIOACTIVE SEED LOCALIZATION;  Surgeon: Rolm Bookbinder, MD;  Location: [facility];  Service: General;  Laterality: Right;   COLONOSCOPY  2016   diverticulosis, hemorrhoids   ESOPHAGOGASTRODUODENOSCOPY  2016   normal   ESOPHAGOGASTRODUODENOSCOPY (EGD) WITH PROPOFOL N/A 04/09/2021   Procedure: ESOPHAGOGASTRODUODENOSCOPY (EGD) WITH PROPOFOL;  Surgeon: Harvel Quale, MD;  [facility];  Service: Gastroenterology;  Laterality: N/A;  12:45   IR GENERIC HISTORICAL  08/29/2016   IR US GUIDE VASC ACCESS RIGHT 08/29/2016 [facility]   IR GENERIC HISTORICAL  08/29/2016   IR RENAL BILAT S&I MOD SED 08/29/2016 [facility]   KNEE ARTHROSCOPY Right    2019   THYROIDECTOMY, PARTIAL Right 1981   middle lobe removed 1st, then right lobe removed 7-8 years later (approx. 1988)    Prior to Admission medications   Medication Sig Start Date End Date Taking? Authorizing Provider  acetaminophen (TYLENOL) 325 MG tablet Take 325 mg by mouth daily as needed for moderate pain or headache.   Yes [provider]  albuterol (PROVENTIL) (2.5 MG/3ML) 0.083% nebulizer solution Take 3 mLs (2.5 mg total) by nebulization every 4 (four) hours as needed for wheezing or shortness of breath. 08/08/21  Yes Kathie Dike, MD  albuterol (VENTOLIN HFA) 108 (90 Base) MCG/ACT inhaler Inhale  2 puffs into the lungs every 6 (six) hours as needed for wheezing or shortness of breath. 08/08/21  Yes Kathie Dike, MD  atorvastatin (LIPITOR) 20 MG tablet TAKE 1/2 TABLET BY MOUTH EVERY DAY 07/08/21  Yes Gottschalk, Ashly M, DO  azelastine (ASTELIN) 0.1 % nasal spray Place 1 spray into both nostrils 2 (two) times daily. 07/31/21  Yes Gottschalk, Ashly M, DO  benzonatate (TESSALON) 200 MG capsule Take 1 capsule (200 mg  total) by mouth 2 (two) times daily as needed for cough. 08/22/21  Yes Chesley Mires, MD  bisoprolol (ZEBETA) 5 MG tablet Take 1 tablet (5 mg total) by mouth daily. 08/09/21  Yes Kathie Dike, MD  budesonide-formoterol (SYMBICORT) 160-4.5 MCG/ACT inhaler Inhale 2 puffs into the lungs 2 (two) times daily. 08/08/21  Yes Kathie Dike, MD  cholecalciferol (VITAMIN D) 1000 units tablet Take 1,000 Units by mouth daily.   Yes [provider]  diclofenac Sodium (VOLTAREN) 1 % GEL Apply 2 g topically 4 (four) times daily. 02/22/21  Yes Ivy Lynn, NP  flecainide (TAMBOCOR) 50 MG tablet Take 1 tablet (50 mg total) by mouth 2 (two) times daily. 08/21/21  Yes Minus Breeding, MD  fluticasone (FLONASE) 50 MCG/ACT nasal spray PLACE 1 SPRAY INTO BOTH NOSTRILS 2 (TWO) TIMES DAILY 08/09/21  Yes Ronnie Doss M, DO  hydrALAZINE (APRESOLINE) 100 MG tablet Take 100 mg by mouth 2 (two) times daily.   Yes [provider]  hydroxypropyl methylcellulose / hypromellose (ISOPTO TEARS / GONIOVISC) 2.5 % ophthalmic solution Place 1 drop into both eyes daily.   Yes [provider]  letrozole (FEMARA) 2.5 MG tablet TAKE 1 TABLET BY MOUTH EVERY DAY 10/23/20  Yes Derek Jack, MD  levothyroxine (SYNTHROID) 75 MCG tablet Take 1 tablet (75 mcg total) by mouth daily. 10/18/20  Yes Gottschalk, Leatrice Jewels M, DO  lisinopril (ZESTRIL) 20 MG tablet Take 1 tablet (20 mg total) by mouth daily. Per Dr Posey Pronto 01/2021 08/08/21  Yes Kathie Dike, MD  mirtazapine (REMERON) 7.5 MG tablet Take 1 tablet (7.5 mg total) by mouth at bedtime. 07/25/21  Yes Harvel Quale, MD  ondansetron (ZOFRAN ODT) 4 MG disintegrating tablet Take 1 tablet (4 mg total) by mouth every 8 (eight) hours as needed. 04/08/21  Yes Carlan, Chelsea L, NP  calcium-vitamin D (OSCAL WITH D) 500-200 MG-UNIT per tablet Take 2 tablets by mouth at bedtime. Patient not taking: Reported on 08/31/2021    [provider]  ferrous  sulfate 325 (65 FE) MG tablet Take 325 mg by mouth 2 (two) times daily with a meal. Patient not taking: Reported on 08/31/2021    [provider]  guaiFENesin (MUCINEX) 600 MG 12 hr tablet Take 1 tablet (600 mg total) by mouth 2 (two) times daily. Patient not taking: Reported on 08/31/2021 08/08/21   Kathie Dike, MD  Spacer/Aero-Holding Chambers (PROCARE SPACER/ADULT MASK) DEVI Use as directed 08/08/21   Kathie Dike, MD    Current Facility-Administered Medications  Medication Dose Route Frequency Provider Last Rate Last Admin   0.9 %  sodium chloride infusion   Intravenous Continuous Truett Mainland, DO 75 mL/hr at 09/01/21 0325 New Bag at 09/01/21 0325   albuterol (PROVENTIL) (2.5 MG/3ML) 0.083% nebulizer solution 3 mL  3 mL Inhalation Q6H PRN Truett Mainland, DO       atorvastatin (LIPITOR) tablet 10 mg  10 mg Oral Daily Truett Mainland, DO   10 mg at 09/01/21 0847   bisoprolol (ZEBETA) tablet 5 mg  5 mg Oral Daily Truett Mainland, DO   5 mg at 09/01/21 2409   flecainide (TAMBOCOR) tablet 50 mg  50 mg Oral BID Truett Mainland, DO   50 mg at 09/01/21 0847   fluticasone furoate-vilanterol (BREO ELLIPTA) 200-25 MCG/ACT 1 puff  1 puff Inhalation Daily Truett Mainland, DO   1 puff at 09/01/21 7353   hydrALAZINE (APRESOLINE) tablet 100 mg  100 mg Oral BID Truett Mainland, DO   100 mg at 09/01/21 0847   letrozole [facility]) tablet 2.5 mg  2.5 mg Oral Daily Truett Mainland, DO       levothyroxine (SYNTHROID) tablet 75 mcg  75 mcg Oral Q0600 Truett Mainland, DO   75 mcg at 09/01/21 2992   losartan (COZAAR) tablet 25 mg  25 mg Oral Daily Truett Mainland, DO       mirtazapine (REMERON) tablet 7.5 mg  7.5 mg Oral QHS Truett Mainland, DO   7.5 mg at 08/31/21 2353   ondansetron (ZOFRAN) tablet 4 mg  4 mg Oral Q6H PRN Truett Mainland, DO       Or   ondansetron [facility]) injection 4 mg  4 mg Intravenous Q6H PRN Truett Mainland, DO       ondansetron (ZOFRAN-ODT) disintegrating tablet  4 mg  4 mg Oral Q8H PRN Truett Mainland, DO       pantoprazole (PROTONIX) injection 40 mg  40 mg Intravenous Q12H Shah, Pratik D, DO   40 mg at 09/01/21 0849    Allergies as of 08/31/2021 - Review Complete 08/31/2021  Allergen Reaction Noted   Amlodipine Other (See Comments) 10/24/2015   Xeroform occlusive gauze strip [bismuth tribromoph-petrolatum]  05/30/2021   Isosorbide nitrate Nausea Only 06/19/2014   Sulfonamide derivatives Nausea Only and Rash 12/19/2009   Terazosin Hives and Nausea Only 06/10/2013    Family History  Problem Relation Age of Onset   Thyroid disease Mother    Stroke Father    Hypertension Brother    Lung cancer Maternal Uncle    Cancer Maternal Grandmother        Liver   Lung cancer Maternal Uncle    Hypertension Daughter    Hypercholesterolemia Daughter    Hypercholesterolemia Daughter     Social History   Socioeconomic History   Marital status: Widowed    Spouse name: Not on file   Number of children: 2   Years of education: 1   Highest education level: Not on file  Occupational History   Occupation: Retired  Tobacco Use   Smoking status: Never   Smokeless tobacco: Never  Vaping Use   Vaping Use: Never used  Substance and Sexual Activity   Alcohol use: No   Drug use: No   Sexual activity: Never  Other Topics Concern   Not on file  Social History Narrative   Lives alone, but her daughter stays there at night   Ambidextrous (writes with left hand).   No caffeine use.   Social Determinants of Health   Financial Resource Strain: Low Risk    Difficulty of Paying Living Expenses: Not hard at all  Food Insecurity: No Food Insecurity   Worried About Charity fundraiser in the Last Year: Never true   Banks in the Last Year: Never true  Transportation Needs: No Transportation Needs   Lack of Transportation (Medical): No   Lack of Transportation (Non-Medical): No  Physical Activity: Inactive   Days  of Exercise per Week: 0 days    Minutes of Exercise per Session: 0 min  Stress: No Stress Concern Present   Feeling of Stress : Not at all  Social Connections: Moderately Isolated   Frequency of Communication with Friends and Family: More than three times a week   Frequency of Social Gatherings with Friends and Family: More than three times a week   Attends Religious Services: More than 4 times per year   Active Member of [employer] or Organizations: No   Attends Archivist Meetings: Never   Marital Status: Widowed  Human resources officer Violence: Not At Risk   Fear of Current or Ex-Partner: No   Emotionally Abused: No   Physically Abused: No   Sexually Abused: No    Review of Systems: General: Reports weight loss, generalized weakness/fatigue, no fevers or chills Eyes: Negative for vision changes.  ENT: Negative for hoarseness, difficulty swallowing , nasal congestion. CV: Negative for chest pain, angina, palpitations, dyspnea on exertion, peripheral edema.  Respiratory: Negative for dyspnea at rest, dyspnea on exertion, notes chronic cough cough, no sputum, no wheezing.  GI: See history of present illness. GU:  Negative for dysuria, hematuria, urinary incontinence, urinary frequency, nocturnal urination.  MS: Negative for joint pain, low back pain.  Derm: Negative for rash or itching.  Neuro: Negative for weakness, abnormal sensation, seizure, frequent headaches, memory loss, confusion.  Psych: Negative for anxiety, depression Endo: Negative for unusual weight change.  Heme: Negative for bruising or bleeding. Allergy: Negative for rash or hives.  Physical Exam: Vital signs in last 24 hours: Temp:  [98 F (36.7 C)-99 F (37.2 C)] 98 F (36.7 C) (01/15 0310) Pulse Rate:  [64-80] 80 (01/15 0310) Resp:  [17-34] 18 (01/15 0310) BP: (152-199)/(59-88) 152/74 (01/15 0310) SpO2:  [92 %-98 %] 92 % (01/15 0841) Weight:  [48.5 kg] 48.5 kg (01/15 0200) Last BM Date: 08/31/21 General:   Alert, frail-appearing,  pleasant and cooperative in NAD Head:  Normocephalic and atraumatic. Eyes:  Sclera clear, no icterus.   Conjunctiva pink. Ears:  Normal auditory acuity. Nose:  No deformity, discharge,  or lesions. Mouth:  No deformity or lesions, dentition normal. Neck:  Supple; no masses or thyromegaly. Lungs:  Clear throughout to auscultation.   No wheezes, crackles, or rhonchi. No acute distress. Heart:  Regular rate and rhythm; no murmurs, clicks, rubs,  or gallops. Abdomen:  Soft, nontender and nondistended. No masses, hepatosplenomegaly or hernias noted. Normal bowel sounds, without guarding, and without rebound.   Msk:  Symmetrical without gross deformities. Normal posture. Pulses:  Normal pulses noted. Extremities:  Without clubbing or edema. Neurologic:  Alert and  oriented x4;  grossly normal neurologically. Skin:  Intact without significant lesions or rashes. Cervical Nodes:  No significant cervical adenopathy. Psych:  Alert and cooperative. Normal mood and affect.  Intake/Output from previous day: 01/14 0701 - 01/15 0700 In: 1302.5 [I.V.:40.5; Blood:1262] Out: -  Intake/Output this shift: Total I/O In: 240 [P.O.:240] Out: -   Lab Results: Recent Labs    08/31/21 1600 09/01/21 0506  WBC 4.4 5.0  HGB 7.4* 10.1*  HCT 22.5* 30.1*  PLT 153 140*   BMET Recent Labs    08/31/21 1600 09/01/21 0506  NA 129* 130*  K 4.2 3.7  CL 92* 95*  CO2 27 26  GLUCOSE 116* 103*  BUN 23 19  CREATININE 2.00* 1.74*  CALCIUM 8.9 8.3*   LFT Recent Labs    08/31/21 1600  PROT 7.0  ALBUMIN 3.2*  AST 16  ALT 12  ALKPHOS 54  BILITOT 0.6   PT/INR No results for input(s): LABPROT, INR in the last 72 hours. Hepatitis Panel No results for input(s): HEPBSAG, HCVAB, HEPAIGM, HEPBIGM in the last 72 hours. C-Diff No results for input(s): CDIFFTOX in the last 72 hours.  Studies/Results: DG Chest 2 View  Result Date: 08/31/2021 CLINICAL DATA:  Nonproductive cough, wheezing, shortness of  breath for several days. EXAM: CHEST - 2 VIEW COMPARISON:  08/03/2021 FINDINGS: Stable cardiomegaly. Aortic atherosclerotic calcification noted. Stable pulmonary hyperinflation and chronic interstitial prominence, consistent with COPD. No evidence of pulmonary infiltrate or edema. No evidence of pleural effusion. IMPRESSION: Stable cardiomegaly and COPD. No active lung disease. Electronically Signed   By: Marlaine Hind M.D.   On: 08/31/2021 16:44    Impression: *Symptomatic anemia-suspected upper GI bleed *Early satiety *Nausea *Weight loss  Plan: Etiology of patient's above symptoms unclear.  She does have history of esophageal candidiasis in the past.  Possible recurrent infection.  That being said, we will pursue EGD today to evaluate for peptic ulcer disease, esophagitis, gastritis, H. Pylori, duodenitis, or other. Will also evaluate for esophageal stricture, Schatzki's ring, esophageal web or other.   The risks including infection, bleed, or perforation as well as benefits, limitations, alternatives and imponderables have been reviewed with the patient. Potential for esophageal dilation, biopsy, etc. have also been reviewed.  Questions have been answered. All parties agreeable.  Agree with IV Protonix twice daily until GI bleed safely ruled out.  Continue to monitor H&H and transfuse for less than 7  Will make patient NPO.  Last clear liquid ingested at 9 AM.  Discussed with anesthesia who states okay to proceed.  Further recommendations to follow   Elon Alas. Abbey Chatters, D.O. Gastroenterology and Hepatology [facility]    LOS: 1 day     09/01/2021, 10:58 AM

## 2021-09-01 NOTE — Anesthesia Postprocedure Evaluation (Signed)
Anesthesia Post Note  Patient: [name]  Procedure(s) Performed: ESOPHAGOGASTRODUODENOSCOPY (EGD) WITH PROPOFOL  Patient location during evaluation: PACU Anesthesia Type: General Level of consciousness: awake and alert Pain management: pain level controlled Vital Signs Assessment: post-procedure vital signs reviewed and stable Respiratory status: spontaneous breathing, nonlabored ventilation, respiratory function stable and patient connected to nasal cannula oxygen Cardiovascular status: blood pressure returned to baseline and stable Postop Assessment: no apparent nausea or vomiting Anesthetic complications: no   No notable events documented.   Last Vitals:  Vitals:   09/01/21 0841 09/01/21 1158  BP:  (!) 193/73  Pulse:  65  Resp:  20  Temp:  36.9 C  SpO2: 92% 98%    Last Pain:  Vitals:   09/01/21 0900  TempSrc:   PainSc: 0-No pain                 Louann Sjogren

## 2021-09-01 NOTE — Transfer of Care (Signed)
Immediate Anesthesia Transfer of Care Note  Patient: [name]  Procedure(s) Performed: ESOPHAGOGASTRODUODENOSCOPY (EGD) WITH PROPOFOL  Patient Location: PACU  Anesthesia Type:General  Level of Consciousness: awake  Airway & Oxygen Therapy: Patient Spontanous Breathing  Post-op Assessment: Report given to RN and Post -op Vital signs reviewed and stable  Post vital signs: Reviewed and stable  Last Vitals:  Vitals Value Taken Time  BP    Temp    Pulse    Resp    SpO2      Last Pain:  Vitals:   09/01/21 0900  TempSrc:   PainSc: 0-No pain         Complications: No notable events documented.

## 2021-09-01 NOTE — H&P (View-Only) (Signed)
Consulting  Provider: Dr. Nehemiah Settle Primary Care Physician:  [name], DO Primary Gastroenterologist:  Dr. Jenetta Downer  Reason for Consultation: Symptomatic anemia, suspected upper GI bleed, weight loss, early satiety  HPI:  [name] is a 88 y.o. female with a past medical history of paroxysmal atrial fibrillation, hypertension, chronic kidney disease, hypothyroidism, esophageal candidiasis status posttreatment with Diflucan x21 days and August 2022, who presented to [facility] ER yesterday with chief complaint of generalized weakness/fatigue, early satiety, weight loss.  Patient states for the last several weeks to months she has not been eating very well.  States she will get full very quickly.  Even with liquids.  Some nausea.  No dysphagia odynophagia.  No abdominal pain.  Does have dark stools though states she takes iron.  She had an EGD in August 2022 which showed evidence of esophageal candidiasis, biopsies confirm this.  She was treated with 21 days of Diflucan and states her symptoms improved.  At that time she was mainly having nausea and vomiting.  Evaluation in the ER, hemoglobin 7.4, now 10.1 after receiving PRBCs.  Has been started on IV Protonix.  Past Medical History:  Diagnosis Date   Anemia    Arthritis    Asthma    Atrial fibrillation ([place])    Basal cell carcinoma 02/06/2009   Left ear targus- ([facility])   Basal cell carcinoma 09/28/2008   Right back-([id number])   CKD (chronic kidney disease), stage III (HCC)    Heart murmur    Hyperlipidemia    Hypertension    Hypothyroidism    Nodule of right lung    Right upper lobe   PONV (postoperative nausea and vomiting)    Renal insufficiency    Chronic   Renal vascular disease    Right renal artery stenosis ([place]) 05/09/2015   Squamous cell carcinoma of skin 09/07/2019   KA-Left shin-txpbx   Vertigo     Past Surgical History:  Procedure Laterality Date   BIOPSY  04/09/2021   Procedure: BIOPSY;  Surgeon:  Harvel Quale, MD;  [facility];  Service: Gastroenterology;;   BREAST LUMPECTOMY WITH RADIOACTIVE SEED LOCALIZATION Right 04/08/2018   Procedure: BREAST LUMPECTOMY WITH RADIOACTIVE SEED LOCALIZATION;  Surgeon: Rolm Bookbinder, MD;  Location: [facility];  Service: General;  Laterality: Right;   COLONOSCOPY  2016   diverticulosis, hemorrhoids   ESOPHAGOGASTRODUODENOSCOPY  2016   normal   ESOPHAGOGASTRODUODENOSCOPY (EGD) WITH PROPOFOL N/A 04/09/2021   Procedure: ESOPHAGOGASTRODUODENOSCOPY (EGD) WITH PROPOFOL;  Surgeon: Harvel Quale, MD;  Location: [facility];  Service: Gastroenterology;  Laterality: N/A;  12:45   IR GENERIC HISTORICAL  08/29/2016   IR US GUIDE VASC ACCESS RIGHT 08/29/2016 [facility]   IR GENERIC HISTORICAL  08/29/2016   IR RENAL BILAT S&I MOD SED 08/29/2016 [facility]   KNEE ARTHROSCOPY Right    2019   THYROIDECTOMY, PARTIAL Right 1981   middle lobe removed 1st, then right lobe removed 7-8 years later (approx. 1988)    Prior to Admission medications   Medication Sig Start Date End Date Taking? Authorizing Provider  acetaminophen (TYLENOL) 325 MG tablet Take 325 mg by mouth daily as needed for moderate pain or headache.   Yes [provider]  albuterol (PROVENTIL) (2.5 MG/3ML) 0.083% nebulizer solution Take 3 mLs (2.5 mg total) by nebulization every 4 (four) hours as needed for wheezing or shortness of breath. 08/08/21  Yes Kathie Dike, MD  albuterol (VENTOLIN HFA) 108 (90 Base) MCG/ACT inhaler Inhale  2 puffs into the lungs every 6 (six) hours as needed for wheezing or shortness of breath. 08/08/21  Yes Kathie Dike, MD  atorvastatin (LIPITOR) 20 MG tablet TAKE 1/2 TABLET BY MOUTH EVERY DAY 07/08/21  Yes Gottschalk, Ashly M, DO  azelastine (ASTELIN) 0.1 % nasal spray Place 1 spray into both nostrils 2 (two) times daily. 07/31/21  Yes Gottschalk, Ashly M, DO  benzonatate (TESSALON) 200 MG capsule Take 1 capsule (200 mg  total) by mouth 2 (two) times daily as needed for cough. 08/22/21  Yes Chesley Mires, MD  bisoprolol (ZEBETA) 5 MG tablet Take 1 tablet (5 mg total) by mouth daily. 08/09/21  Yes Kathie Dike, MD  budesonide-formoterol (SYMBICORT) 160-4.5 MCG/ACT inhaler Inhale 2 puffs into the lungs 2 (two) times daily. 08/08/21  Yes Kathie Dike, MD  cholecalciferol (VITAMIN D) 1000 units tablet Take 1,000 Units by mouth daily.   Yes [provider]  diclofenac Sodium (VOLTAREN) 1 % GEL Apply 2 g topically 4 (four) times daily. 02/22/21  Yes Ivy Lynn, NP  flecainide (TAMBOCOR) 50 MG tablet Take 1 tablet (50 mg total) by mouth 2 (two) times daily. 08/21/21  Yes Minus Breeding, MD  fluticasone (FLONASE) 50 MCG/ACT nasal spray PLACE 1 SPRAY INTO BOTH NOSTRILS 2 (TWO) TIMES DAILY 08/09/21  Yes Ronnie Doss M, DO  hydrALAZINE (APRESOLINE) 100 MG tablet Take 100 mg by mouth 2 (two) times daily.   Yes [provider]  hydroxypropyl methylcellulose / hypromellose (ISOPTO TEARS / GONIOVISC) 2.5 % ophthalmic solution Place 1 drop into both eyes daily.   Yes [provider]  letrozole (FEMARA) 2.5 MG tablet TAKE 1 TABLET BY MOUTH EVERY DAY 10/23/20  Yes Derek Jack, MD  levothyroxine (SYNTHROID) 75 MCG tablet Take 1 tablet (75 mcg total) by mouth daily. 10/18/20  Yes Gottschalk, Leatrice Jewels M, DO  lisinopril (ZESTRIL) 20 MG tablet Take 1 tablet (20 mg total) by mouth daily. Per Dr Posey Pronto 01/2021 08/08/21  Yes Kathie Dike, MD  mirtazapine (REMERON) 7.5 MG tablet Take 1 tablet (7.5 mg total) by mouth at bedtime. 07/25/21  Yes Harvel Quale, MD  ondansetron (ZOFRAN ODT) 4 MG disintegrating tablet Take 1 tablet (4 mg total) by mouth every 8 (eight) hours as needed. 04/08/21  Yes Carlan, Chelsea L, NP  calcium-vitamin D (OSCAL WITH D) 500-200 MG-UNIT per tablet Take 2 tablets by mouth at bedtime. Patient not taking: Reported on 08/31/2021    [provider]  ferrous  sulfate 325 (65 FE) MG tablet Take 325 mg by mouth 2 (two) times daily with a meal. Patient not taking: Reported on 08/31/2021    [provider]  guaiFENesin (MUCINEX) 600 MG 12 hr tablet Take 1 tablet (600 mg total) by mouth 2 (two) times daily. Patient not taking: Reported on 08/31/2021 08/08/21   Kathie Dike, MD  Spacer/Aero-Holding Chambers (PROCARE SPACER/ADULT MASK) DEVI Use as directed 08/08/21   Kathie Dike, MD    Current Facility-Administered Medications  Medication Dose Route Frequency Provider Last Rate Last Admin   0.9 %  sodium chloride infusion   Intravenous Continuous Truett Mainland, DO 75 mL/hr at 09/01/21 0325 New Bag at 09/01/21 0325   albuterol (PROVENTIL) (2.5 MG/3ML) 0.083% nebulizer solution 3 mL  3 mL Inhalation Q6H PRN Truett Mainland, DO       atorvastatin (LIPITOR) tablet 10 mg  10 mg Oral Daily Truett Mainland, DO   10 mg at 09/01/21 0847   bisoprolol (ZEBETA) tablet 5 mg  5 mg Oral Daily Truett Mainland, DO   5 mg at 09/01/21 1601   flecainide (TAMBOCOR) tablet 50 mg  50 mg Oral BID Truett Mainland, DO   50 mg at 09/01/21 0847   fluticasone furoate-vilanterol (BREO ELLIPTA) 200-25 MCG/ACT 1 puff  1 puff Inhalation Daily Truett Mainland, DO   1 puff at 09/01/21 0932   hydrALAZINE (APRESOLINE) tablet 100 mg  100 mg Oral BID Truett Mainland, DO   100 mg at 09/01/21 0847   letrozole [facility]) tablet 2.5 mg  2.5 mg Oral Daily Truett Mainland, DO       levothyroxine (SYNTHROID) tablet 75 mcg  75 mcg Oral Q0600 Truett Mainland, DO   75 mcg at 09/01/21 3557   losartan (COZAAR) tablet 25 mg  25 mg Oral Daily Truett Mainland, DO       mirtazapine (REMERON) tablet 7.5 mg  7.5 mg Oral QHS Truett Mainland, DO   7.5 mg at 08/31/21 2353   ondansetron (ZOFRAN) tablet 4 mg  4 mg Oral Q6H PRN Truett Mainland, DO       Or   ondansetron [facility]) injection 4 mg  4 mg Intravenous Q6H PRN Truett Mainland, DO       ondansetron (ZOFRAN-ODT) disintegrating tablet  4 mg  4 mg Oral Q8H PRN Truett Mainland, DO       pantoprazole (PROTONIX) injection 40 mg  40 mg Intravenous Q12H Shah, Pratik D, DO   40 mg at 09/01/21 0849    Allergies as of 08/31/2021 - Review Complete 08/31/2021  Allergen Reaction Noted   Amlodipine Other (See Comments) 10/24/2015   Xeroform occlusive gauze strip [bismuth tribromoph-petrolatum]  05/30/2021   Isosorbide nitrate Nausea Only 06/19/2014   Sulfonamide derivatives Nausea Only and Rash 12/19/2009   Terazosin Hives and Nausea Only 06/10/2013    Family History  Problem Relation Age of Onset   Thyroid disease Mother    Stroke Father    Hypertension Brother    Lung cancer Maternal Uncle    Cancer Maternal Grandmother        Liver   Lung cancer Maternal Uncle    Hypertension Daughter    Hypercholesterolemia Daughter    Hypercholesterolemia Daughter     Social History   Socioeconomic History   Marital status: Widowed    Spouse name: Not on file   Number of children: 2   Years of education: 23   Highest education level: Not on file  Occupational History   Occupation: Retired  Tobacco Use   Smoking status: Never   Smokeless tobacco: Never  Vaping Use   Vaping Use: Never used  Substance and Sexual Activity   Alcohol use: No   Drug use: No   Sexual activity: Never  Other Topics Concern   Not on file  Social History Narrative   Lives alone, but her daughter stays there at night   Ambidextrous (writes with left hand).   No caffeine use.   Social Determinants of Health   Financial Resource Strain: Low Risk    Difficulty of Paying Living Expenses: Not hard at all  Food Insecurity: No Food Insecurity   Worried About Charity fundraiser in the Last Year: Never true   [place] in the Last Year: Never true  Transportation Needs: No Transportation Needs   Lack of Transportation (Medical): No   Lack of Transportation (Non-Medical): No  Physical Activity: Inactive   Days  of Exercise per Week: 0 days    Minutes of Exercise per Session: 0 min  Stress: No Stress Concern Present   Feeling of Stress : Not at all  Social Connections: Moderately Isolated   Frequency of Communication with Friends and Family: More than three times a week   Frequency of Social Gatherings with Friends and Family: More than three times a week   Attends Religious Services: More than 4 times per year   Active Member of [employer] or Organizations: No   Attends Archivist Meetings: Never   Marital Status: Widowed  Human resources officer Violence: Not At Risk   Fear of Current or Ex-Partner: No   Emotionally Abused: No   Physically Abused: No   Sexually Abused: No    Review of Systems: General: Reports weight loss, generalized weakness/fatigue, no fevers or chills Eyes: Negative for vision changes.  ENT: Negative for hoarseness, difficulty swallowing , nasal congestion. CV: Negative for chest pain, angina, palpitations, dyspnea on exertion, peripheral edema.  Respiratory: Negative for dyspnea at rest, dyspnea on exertion, notes chronic cough cough, no sputum, no wheezing.  GI: See history of present illness. GU:  Negative for dysuria, hematuria, urinary incontinence, urinary frequency, nocturnal urination.  MS: Negative for joint pain, low back pain.  Derm: Negative for rash or itching.  Neuro: Negative for weakness, abnormal sensation, seizure, frequent headaches, memory loss, confusion.  Psych: Negative for anxiety, depression Endo: Negative for unusual weight change.  Heme: Negative for bruising or bleeding. Allergy: Negative for rash or hives.  Physical Exam: Vital signs in last 24 hours: Temp:  [98 F (36.7 C)-99 F (37.2 C)] 98 F (36.7 C) (01/15 0310) Pulse Rate:  [64-80] 80 (01/15 0310) Resp:  [17-34] 18 (01/15 0310) BP: (152-199)/(59-88) 152/74 (01/15 0310) SpO2:  [92 %-98 %] 92 % (01/15 0841) Weight:  [48.5 kg] 48.5 kg (01/15 0200) Last BM Date: 08/31/21 General:   Alert, frail-appearing,  pleasant and cooperative in NAD Head:  Normocephalic and atraumatic. Eyes:  Sclera clear, no icterus.   Conjunctiva pink. Ears:  Normal auditory acuity. Nose:  No deformity, discharge,  or lesions. Mouth:  No deformity or lesions, dentition normal. Neck:  Supple; no masses or thyromegaly. Lungs:  Clear throughout to auscultation.   No wheezes, crackles, or rhonchi. No acute distress. Heart:  Regular rate and rhythm; no murmurs, clicks, rubs,  or gallops. Abdomen:  Soft, nontender and nondistended. No masses, hepatosplenomegaly or hernias noted. Normal bowel sounds, without guarding, and without rebound.   Msk:  Symmetrical without gross deformities. Normal posture. Pulses:  Normal pulses noted. Extremities:  Without clubbing or edema. Neurologic:  Alert and  oriented x4;  grossly normal neurologically. Skin:  Intact without significant lesions or rashes. Cervical Nodes:  No significant cervical adenopathy. Psych:  Alert and cooperative. Normal mood and affect.  Intake/Output from previous day: 01/14 0701 - 01/15 0700 In: 1302.5 [I.V.:40.5; Blood:1262] Out: -  Intake/Output this shift: Total I/O In: 240 [P.O.:240] Out: -   Lab Results: Recent Labs    08/31/21 1600 09/01/21 0506  WBC 4.4 5.0  HGB 7.4* 10.1*  HCT 22.5* 30.1*  PLT 153 140*   BMET Recent Labs    08/31/21 1600 09/01/21 0506  NA 129* 130*  K 4.2 3.7  CL 92* 95*  CO2 27 26  GLUCOSE 116* 103*  BUN 23 19  CREATININE 2.00* 1.74*  CALCIUM 8.9 8.3*   LFT Recent Labs    08/31/21 1600  PROT 7.0  ALBUMIN 3.2*  AST 16  ALT 12  ALKPHOS 54  BILITOT 0.6   PT/INR No results for input(s): LABPROT, INR in the last 72 hours. Hepatitis Panel No results for input(s): HEPBSAG, HCVAB, HEPAIGM, HEPBIGM in the last 72 hours. C-Diff No results for input(s): CDIFFTOX in the last 72 hours.  Studies/Results: DG Chest 2 View  Result Date: 08/31/2021 CLINICAL DATA:  Nonproductive cough, wheezing, shortness of  breath for several days. EXAM: CHEST - 2 VIEW COMPARISON:  08/03/2021 FINDINGS: Stable cardiomegaly. Aortic atherosclerotic calcification noted. Stable pulmonary hyperinflation and chronic interstitial prominence, consistent with COPD. No evidence of pulmonary infiltrate or edema. No evidence of pleural effusion. IMPRESSION: Stable cardiomegaly and COPD. No active lung disease. Electronically Signed   By: Marlaine Hind M.D.   On: 08/31/2021 16:44    Impression: *Symptomatic anemia-suspected upper GI bleed *Early satiety *Nausea *Weight loss  Plan: Etiology of patient's above symptoms unclear.  She does have history of esophageal candidiasis in the past.  Possible recurrent infection.  That being said, we will pursue EGD today to evaluate for peptic ulcer disease, esophagitis, gastritis, H. Pylori, duodenitis, or other. Will also evaluate for esophageal stricture, Schatzki's ring, esophageal web or other.   The risks including infection, bleed, or perforation as well as benefits, limitations, alternatives and imponderables have been reviewed with the patient. Potential for esophageal dilation, biopsy, etc. have also been reviewed.  Questions have been answered. All parties agreeable.  Agree with IV Protonix twice daily until GI bleed safely ruled out.  Continue to monitor H&H and transfuse for less than 7  Will make patient NPO.  Last clear liquid ingested at 9 AM.  Discussed with anesthesia who states okay to proceed.  Further recommendations to follow   Elon Alas. Abbey Chatters, D.O. Gastroenterology and Hepatology [facility]    LOS: 1 day     09/01/2021, 10:58 AM

## 2021-09-01 NOTE — Interval H&P Note (Signed)
History and Physical Interval Note:  09/01/2021 11:59 AM  [name]  has presented today for surgery, with the diagnosis of Nausea, anemia, early satiety.  The various methods of treatment have been discussed with the patient and family. After consideration of risks, benefits and other options for treatment, the patient has consented to  Procedure(s): ESOPHAGOGASTRODUODENOSCOPY (EGD) WITH PROPOFOL (N/A) as a surgical intervention.  The patient's history has been reviewed, patient examined, no change in status, stable for surgery.  I have reviewed the patient's chart and labs.  Questions were answered to the patient's satisfaction.     Eloise Harman

## 2021-09-01 NOTE — Progress Notes (Signed)
PROGRESS NOTE    [name]  PPJ:[id number] DOB: Jul 29, 1934 DOA: 08/31/2021 PCP: [name], DO   Brief Narrative:   [name] is a 88 y.o. female with a history of paroxysmal atrial fibrillation, history of breast cancer s/p lumpectomy and hormone therapy with no recurrence, hypertension, stage III chronic kidney disease, hypothyroidism.  Patient was admitted for symptomatic anemia in the setting of upper GI bleed and has been transfused 2 units of PRBCs with plans for endoscopy 1/15.  She is also noted to have some failure to thrive.  Assessment & Plan:   Principal Problem:   Symptomatic anemia Active Problems:   Essential hypertension   Paroxysmal atrial fibrillation (HCC)   Chronic diastolic heart failure (HCC)   Hyponatremia   Hypothyroidism   Acute-on-chronic kidney injury (HCC)   Dry cough   UGI bleed   Failure to thrive in adult   Loss of weight   Early satiety   Symptomatic acute blood loss anemia in the setting of possible upper GI bleed -Noted to have dark stools on admission -Status post 2 unit PRBC transfusion -Continue PPI -Appreciate GI recommendation for endoscopy 1/15 -Continue monitoring CBC  Failure to thrive -Likely will need increase in Remeron at home  AKI-improving -Continue on IV fluid hydration, time-limited  Hyponatremia -Continue normal saline and monitor  Hypertension-stable -Continue home regimen  Chronic diastolic heart failure -Currently without any symptoms of heart failure, monitor closely  Dry cough -Plan to discontinue lisinopril and change to ARB on discharge -Holding those medications for now given AKI  Paroxysmal atrial fibrillation -Currently in sinus rhythm, continue home medications -Does not appear to be on any home anticoagulation  DVT prophylaxis: SCDs Code Status: DNR Family Communication: Discussed with daughter on phone 1/15 Disposition Plan:  Status is: Inpatient  Remains inpatient  appropriate because: Need for endoscopy, IV medications  Consultants:  GI  Procedures:  Endoscopy 1/15  Antimicrobials:  None   Subjective: Patient seen and evaluated today with no new acute complaints or concerns. No acute concerns or events noted overnight. No further dark stools noted since admission.  Hemoglobin has improved after 2 unit PRBC transfusion.  Objective: Vitals:   09/01/21 0043 09/01/21 0200 09/01/21 0310 09/01/21 0841  BP: (!) 172/59 (!) 179/60 (!) 152/74   Pulse: 79 78 80   Resp: 18 17 18    Temp: 98.5 F (36.9 C) 98.5 F (36.9 C) 98 F (36.7 C)   TempSrc: Oral Oral Oral   SpO2:  97% 92% 92%  Weight:  48.5 kg    Height:  5\' 5"  (1.651 m)      Intake/Output Summary (Last 24 hours) at 09/01/2021 1134 Last data filed at 09/01/2021 0900 Gross per 24 hour  Intake 1542.45 ml  Output --  Net 1542.45 ml   Filed Weights   09/01/21 0200  Weight: 48.5 kg    Examination:  General exam: Appears calm and comfortable  Respiratory system: Clear to auscultation. Respiratory effort normal.  Currently on nasal cannula. Cardiovascular system: S1 & S2 heard, RRR.  Gastrointestinal system: Abdomen is soft Central nervous system: Alert and awake Extremities: No edema Skin: No significant lesions noted Psychiatry: Flat affect.    Data Reviewed: I have personally reviewed following labs and imaging studies  CBC: Recent Labs  Lab 08/31/21 1600 09/01/21 0506  WBC 4.4 5.0  NEUTROABS 3.3  --   HGB 7.4* 10.1*  HCT 22.5* 30.1*  MCV 84.6 86.0  PLT 153 140*  Basic Metabolic Panel: Recent Labs  Lab 08/31/21 1600 09/01/21 0506  NA 129* 130*  K 4.2 3.7  CL 92* 95*  CO2 27 26  GLUCOSE 116* 103*  BUN 23 19  CREATININE 2.00* 1.74*  CALCIUM 8.9 8.3*   GFR: Estimated Creatinine Clearance: 18.1 mL/min (A) (by C-G formula based on SCr of 1.74 mg/dL (H)). Liver Function Tests: Recent Labs  Lab 08/31/21 1600  AST 16  ALT 12  ALKPHOS 54  BILITOT 0.6   PROT 7.0  ALBUMIN 3.2*   No results for input(s): LIPASE, AMYLASE in the last 168 hours. No results for input(s): AMMONIA in the last 168 hours. Coagulation Profile: No results for input(s): INR, PROTIME in the last 168 hours. Cardiac Enzymes: No results for input(s): CKTOTAL, CKMB, CKMBINDEX, TROPONINI in the last 168 hours. BNP (last 3 results) No results for input(s): PROBNP in the last 8760 hours. HbA1C: No results for input(s): HGBA1C in the last 72 hours. CBG: No results for input(s): GLUCAP in the last 168 hours. Lipid Profile: No results for input(s): CHOL, HDL, LDLCALC, TRIG, CHOLHDL, LDLDIRECT in the last 72 hours. Thyroid Function Tests: Recent Labs    09/01/21 0506  TSH 1.584   Anemia Panel: No results for input(s): VITAMINB12, FOLATE, FERRITIN, TIBC, IRON, RETICCTPCT in the last 72 hours. Sepsis Labs: No results for input(s): PROCALCITON, LATICACIDVEN in the last 168 hours.  Recent Results (from the past 240 hour(s))  Resp Panel by RT-PCR (Flu A&B, Covid) Nasopharyngeal Swab     Status: None   Collection Time: 08/31/21  8:29 PM   Specimen: Nasopharyngeal Swab; Nasopharyngeal(NP) swabs in vial transport medium  Result Value Ref Range Status   SARS Coronavirus 2 by RT PCR NEGATIVE NEGATIVE Final    Comment: (NOTE) SARS-CoV-2 target nucleic acids are NOT DETECTED.  The SARS-CoV-2 RNA is generally detectable in upper respiratory specimens during the acute phase of infection. The lowest concentration of SARS-CoV-2 viral copies this assay can detect is 138 copies/mL. A negative result does not preclude SARS-Cov-2 infection and should not be used as the sole basis for treatment or other patient management decisions. A negative result may occur with  improper specimen collection/handling, submission of specimen other than nasopharyngeal swab, presence of viral mutation(s) within the areas targeted by this assay, and inadequate number of viral copies(<138  copies/mL). A negative result must be combined with clinical observations, patient history, and epidemiological information. The expected result is Negative.  Fact Sheet for Patients:  EntrepreneurPulse.com.au  Fact Sheet for Healthcare Providers:  IncredibleEmployment.be  This test is no t yet approved or cleared by the [place] FDA and  has been authorized for detection and/or diagnosis of SARS-CoV-2 by FDA under an Emergency Use Authorization (EUA). This EUA will remain  in effect (meaning this test can be used) for the duration of the COVID-19 declaration under Section 564(b)(1) of the Act, 21 U.S.C.section 360bbb-3(b)(1), unless the authorization is terminated  or revoked sooner.       Influenza A by PCR NEGATIVE NEGATIVE Final   Influenza B by PCR NEGATIVE NEGATIVE Final    Comment: (NOTE) The Xpert Xpress SARS-CoV-2/FLU/RSV plus assay is intended as an aid in the diagnosis of influenza from Nasopharyngeal swab specimens and should not be used as a sole basis for treatment. Nasal washings and aspirates are unacceptable for Xpert Xpress SARS-CoV-2/FLU/RSV testing.  Fact Sheet for Patients: EntrepreneurPulse.com.au  Fact Sheet for Healthcare Providers: IncredibleEmployment.be  This test is not yet approved or cleared by  the [employer] and has been authorized for detection and/or diagnosis of SARS-CoV-2 by FDA under an Emergency Use Authorization (EUA). This EUA will remain in effect (meaning this test can be used) for the duration of the COVID-19 declaration under Section 564(b)(1) of the Act, 21 U.S.C. section 360bbb-3(b)(1), unless the authorization is terminated or revoked.  Performed at [facility], [address] [place], [place] [address]          Radiology Studies: DG Chest 2 View  Result Date: 08/31/2021 CLINICAL DATA:  Nonproductive cough, wheezing, shortness of breath  for several days. EXAM: CHEST - 2 VIEW COMPARISON:  08/03/2021 FINDINGS: Stable cardiomegaly. Aortic atherosclerotic calcification noted. Stable pulmonary hyperinflation and chronic interstitial prominence, consistent with COPD. No evidence of pulmonary infiltrate or edema. No evidence of pleural effusion. IMPRESSION: Stable cardiomegaly and COPD. No active lung disease. Electronically Signed   By: Marlaine Hind M.D.   On: 08/31/2021 16:44        Scheduled Meds:  atorvastatin  10 mg Oral Daily   bisoprolol  5 mg Oral Daily   flecainide  50 mg Oral BID   fluticasone furoate-vilanterol  1 puff Inhalation Daily   hydrALAZINE  100 mg Oral BID   letrozole  2.5 mg Oral Daily   levothyroxine  75 mcg Oral Q0600   losartan  25 mg Oral Daily   mirtazapine  7.5 mg Oral QHS   pantoprazole  40 mg Intravenous Q12H   Continuous Infusions:  sodium chloride 75 mL/hr at 09/01/21 0325     LOS: 1 day    Time spent: 35 minutes    Shyanna Klingel Darleen Crocker, DO [facility]  If 7PM-7AM, please contact night-coverage [email or web address] 09/01/2021, 11:34 AM

## 2021-09-02 ENCOUNTER — Telehealth: Payer: Self-pay | Admitting: Gastroenterology

## 2021-09-02 ENCOUNTER — Encounter (HOSPITAL_COMMUNITY): Payer: Self-pay | Admitting: Internal Medicine

## 2021-09-02 LAB — CBC
HCT: 29.3 % — ABNORMAL LOW (ref 36.0–46.0)
Hemoglobin: 9.4 g/dL — ABNORMAL LOW (ref 12.0–15.0)
MCH: 27.2 pg (ref 26.0–34.0)
MCHC: 32.1 g/dL (ref 30.0–36.0)
MCV: 84.9 fL (ref 80.0–100.0)
Platelets: 149 10*3/uL — ABNORMAL LOW (ref 150–400)
RBC: 3.45 MIL/uL — ABNORMAL LOW (ref 3.87–5.11)
RDW: 15.3 % (ref 11.5–15.5)
WBC: 5.4 10*3/uL (ref 4.0–10.5)
nRBC: 0 % (ref 0.0–0.2)

## 2021-09-02 LAB — BPAM RBC
Blood Product Expiration Date: 202302032359
Blood Product Expiration Date: 202302032359
ISSUE DATE / TIME: 202301142053
ISSUE DATE / TIME: 202301150016
Unit Type and Rh: 6200
Unit Type and Rh: 6200

## 2021-09-02 LAB — TYPE AND SCREEN
ABO/RH(D): A POS
Antibody Screen: NEGATIVE
Unit division: 0
Unit division: 0

## 2021-09-02 LAB — BASIC METABOLIC PANEL
Anion gap: 6 (ref 5–15)
BUN: 19 mg/dL (ref 8–23)
CO2: 28 mmol/L (ref 22–32)
Calcium: 8.5 mg/dL — ABNORMAL LOW (ref 8.9–10.3)
Chloride: 98 mmol/L (ref 98–111)
Creatinine, Ser: 1.72 mg/dL — ABNORMAL HIGH (ref 0.44–1.00)
GFR, Estimated: 29 mL/min — ABNORMAL LOW (ref >60)
Glucose, Bld: 106 mg/dL — ABNORMAL HIGH (ref 70–99)
Potassium: 3.6 mmol/L (ref 3.5–5.1)
Sodium: 132 mmol/L — ABNORMAL LOW (ref 135–145)

## 2021-09-02 LAB — MAGNESIUM: Magnesium: 1.8 mg/dL (ref 1.7–2.4)

## 2021-09-02 MED ORDER — LOSARTAN POTASSIUM 25 MG PO TABS: 25.0000 mg | ORAL_TABLET | Freq: Every day | ORAL | 0 refills | Status: DC

## 2021-09-02 MED ORDER — PANTOPRAZOLE SODIUM 40 MG PO TBEC: 40.0000 mg | DELAYED_RELEASE_TABLET | Freq: Two times a day (BID) | ORAL | 0 refills | Status: DC

## 2021-09-02 NOTE — Discharge Summary (Signed)
Physician Discharge Summary  [name] GYJ:[id number] DOB: 12-26-1933 DOA: 08/31/2021  PCP: [name], DO  Admit date: 08/31/2021  Discharge date: 09/02/2021  Admitted From:Home  Disposition:  Home  Recommendations for Outpatient Follow-up:  Follow up with PCP in 1-2 weeks Follow up with GI which will be scheduled Continue PPI BID for 12 weeks as prescribed Continue on Losartan instead of Lisinopril to see if cough resolves and follow up with pcp  Home Health:None  Equipment/Devices:None  Discharge Condition:Stable  CODE STATUS: DNR  Diet recommendation: Heart Healthy  Brief/Interim Summary: [name] is a 88 y.o. female with a history of paroxysmal atrial fibrillation, history of breast cancer s/p lumpectomy and hormone therapy with no recurrence, hypertension, stage III chronic kidney disease, hypothyroidism.  Patient was admitted for symptomatic anemia in the setting of upper GI bleed and has been transfused 2 units of PRBCs. She underwent upper endoscopy on 1/15 with findings of small ulcer in the duodenum, but with no active bleeding noted.  Biopsies were taken for possible H. pylori and Candida.  This will be followed up in the outpatient setting with GI.  She will need to remain on PPI twice daily as otherwise prescribed and is having no further overt bleeding symptoms and is tolerating diet.  No other acute events noted and she is overall stable for discharge.  Discharge Diagnoses:  Principal Problem:   Symptomatic anemia Active Problems:   Essential hypertension   Paroxysmal atrial fibrillation (HCC)   Chronic diastolic heart failure (HCC)   Hyponatremia   Hypothyroidism   Acute-on-chronic kidney injury (HCC)   Dry cough   UGI bleed   Failure to thrive in adult   Loss of weight   Early satiety  Principal discharge diagnosis: Symptomatic acute blood loss anemia status post 2 unit PRBC transfusion likely in the setting of upper GI bleed related to  duodenal ulcer.  Discharge Instructions  Discharge Instructions     Diet - low sodium heart healthy   Complete by: As directed    Increase activity slowly   Complete by: As directed       Allergies as of 09/02/2021       Reactions   Amlodipine Other (See Comments)   edema   Xeroform Occlusive Gauze Strip [bismuth Tribromoph-petrolatum]    Isosorbide Nitrate Nausea Only   Sulfonamide Derivatives Nausea Only, Rash   Terazosin Hives, Nausea Only        Medication List     STOP taking these medications    calcium-vitamin D 500-200 MG-UNIT tablet Commonly known as: OSCAL WITH D   guaiFENesin 600 MG 12 hr tablet Commonly known as: MUCINEX   lisinopril 20 MG tablet Commonly known as: ZESTRIL       TAKE these medications    acetaminophen 325 MG tablet Commonly known as: TYLENOL Take 325 mg by mouth daily as needed for moderate pain or headache.   albuterol (2.5 MG/3ML) 0.083% nebulizer solution Commonly known as: PROVENTIL Take 3 mLs (2.5 mg total) by nebulization every 4 (four) hours as needed for wheezing or shortness of breath.   albuterol 108 (90 Base) MCG/ACT inhaler Commonly known as: VENTOLIN HFA Inhale 2 puffs into the lungs every 6 (six) hours as needed for wheezing or shortness of breath.   atorvastatin 20 MG tablet Commonly known as: LIPITOR TAKE 1/2 TABLET BY MOUTH EVERY DAY   azelastine 0.1 % nasal spray Commonly known as: ASTELIN Place 1 spray into both nostrils 2 (two)  times daily.   benzonatate 200 MG capsule Commonly known as: TESSALON Take 1 capsule (200 mg total) by mouth 2 (two) times daily as needed for cough.   bisoprolol 5 MG tablet Commonly known as: ZEBETA Take 1 tablet (5 mg total) by mouth daily.   budesonide-formoterol 160-4.5 MCG/ACT inhaler Commonly known as: Symbicort Inhale 2 puffs into the lungs 2 (two) times daily.   cholecalciferol 1000 units tablet Commonly known as: VITAMIN D Take 1,000 Units by mouth daily.    diclofenac Sodium 1 % Gel Commonly known as: VOLTAREN Apply 2 g topically 4 (four) times daily.   ferrous sulfate 325 (65 FE) MG tablet Take 325 mg by mouth 2 (two) times daily with a meal.   flecainide 50 MG tablet Commonly known as: TAMBOCOR Take 1 tablet (50 mg total) by mouth 2 (two) times daily.   fluticasone 50 MCG/ACT nasal spray Commonly known as: FLONASE PLACE 1 SPRAY INTO BOTH NOSTRILS 2 (TWO) TIMES DAILY   hydrALAZINE 100 MG tablet Commonly known as: APRESOLINE Take 100 mg by mouth 2 (two) times daily.   hydroxypropyl methylcellulose / hypromellose 2.5 % ophthalmic solution Commonly known as: ISOPTO TEARS / GONIOVISC Place 1 drop into both eyes daily.   letrozole 2.5 MG tablet Commonly known as: FEMARA TAKE 1 TABLET BY MOUTH EVERY DAY   levothyroxine 75 MCG tablet Commonly known as: SYNTHROID Take 1 tablet (75 mcg total) by mouth daily.   losartan 25 MG tablet Commonly known as: COZAAR Take 1 tablet (25 mg total) by mouth daily.   mirtazapine 7.5 MG tablet Commonly known as: REMERON Take 1 tablet (7.5 mg total) by mouth at bedtime.   ondansetron 4 MG disintegrating tablet Commonly known as: Zofran ODT Take 1 tablet (4 mg total) by mouth every 8 (eight) hours as needed.   pantoprazole 40 MG tablet Commonly known as: Protonix Take 1 tablet (40 mg total) by mouth 2 (two) times daily.   Procare Spacer/Adult Psychologist, forensic Use as directed        Follow-up Information     [name], DO. Schedule an appointment as soon as possible for a visit in 1 week(s).   Specialty: Family Medicine Contact information: [place] [address] [phone]         [place]. Schedule an appointment as soon as possible for a visit in 2 week(s).   Contact information: [place] [address] [phone]               Allergies  Allergen Reactions   Amlodipine Other (See  Comments)    edema   Xeroform Occlusive Gauze Strip [Bismuth Tribromoph-Petrolatum]    Isosorbide Nitrate Nausea Only   Sulfonamide Derivatives Nausea Only and Rash   Terazosin Hives and Nausea Only    Consultations: GI   Procedures/Studies: DG Chest 2 View  Result Date: 08/31/2021 CLINICAL DATA:  Nonproductive cough, wheezing, shortness of breath for several days. EXAM: CHEST - 2 VIEW COMPARISON:  08/03/2021 FINDINGS: Stable cardiomegaly. Aortic atherosclerotic calcification noted. Stable pulmonary hyperinflation and chronic interstitial prominence, consistent with COPD. No evidence of pulmonary infiltrate or edema. No evidence of pleural effusion. IMPRESSION: Stable cardiomegaly and COPD. No active lung disease. Electronically Signed   By: Marlaine Hind M.D.   On: 08/31/2021 16:44   DG CHEST PORT 1 VIEW  Result Date: 08/03/2021 CLINICAL DATA:  Nonproductive cough starting today EXAM: PORTABLE CHEST 1 VIEW COMPARISON:  Chest radiograph 08/01/2021 FINDINGS:  Heart is enlarged, unchanged.  The mediastinal contours are stable. There is no new or worsening focal airspace disease. There is no pleural effusion or pneumothorax. The bones are stable. IMPRESSION: Stable chest with no radiographic evidence of acute cardiopulmonary process. Electronically Signed   By: Valetta Mole M.D.   On: 08/03/2021 09:47   ECHOCARDIOGRAM COMPLETE  Result Date: 08/29/2021    ECHOCARDIOGRAM REPORT   Patient Name:   MAHASIN RIVIERE Date of Exam: 08/29/2021 Medical Rec #:  [id number]      Height:       65.0 in Accession #:    [id number]     Weight:       106.4 lb Date of Birth:  06-Jan-1934      BSA:          1.513 m Patient Age:    88 years       BP:           157/63 mmHg Patient Gender: F              HR:           77 bpm. Exam Location:  [facility] Procedure: 2D Echo, Cardiac Doppler and Color Doppler Indications:    I50.32 (ICD-10-CM) - Chronic heart failure with preserved                 ejection fraction  History:         Patient has prior history of Echocardiogram examinations.                 Arrythmias:Atrial Fibrillation; Risk Factors:Hypertension and                 Dyslipidemia. Chronic diastolic heart failure, Hx of Aortic                 valve sclerosis & stenosis, Hx of Atrial tachycardia.  Sonographer:    Alvino Chapel RCS Referring Phys: [place]  1. Left ventricular ejection fraction, by estimation, is 65 to 70%. The left ventricle has normal function. The left ventricle has no regional wall motion abnormalities. There is mild left ventricular hypertrophy. Left ventricular diastolic parameters are consistent with Grade II diastolic dysfunction (pseudonormalization).  2. Right ventricular systolic function is normal. The right ventricular size is normal. There is mildly elevated pulmonary artery systolic pressure. The estimated right ventricular systolic pressure is 38.1 mmHg.  3. Left atrial size was moderately dilated.  4. Right atrial size was moderately dilated.  5. The mitral valve is degenerative. Mild mitral valve regurgitation.  6. The aortic valve is tricuspid. There is moderate calcification of the aortic valve. Aortic valve regurgitation is not visualized. Moderate aortic valve stenosis. Aortic valve mean gradient measures 27.7 mmHg. Aortic valve Vmax measures 3.52 m/s. Dimentionless index 0.50.  7. The inferior vena cava is normal in size with greater than 50% respiratory variability, suggesting right atrial pressure of 3 mmHg. Comparison(s): No prior Echocardiogram. FINDINGS  Left Ventricle: Left ventricular ejection fraction, by estimation, is 65 to 70%. The left ventricle has normal function. The left ventricle has no regional wall motion abnormalities. The left ventricular internal cavity size was normal in size. There is  mild left ventricular hypertrophy. Left ventricular diastolic parameters are consistent with Grade II diastolic dysfunction (pseudonormalization). Right  Ventricle: The right ventricular size is normal. No increase in right ventricular wall thickness. Right ventricular systolic function is normal. There is mildly elevated pulmonary artery systolic pressure. The tricuspid  regurgitant velocity is 3.21  m/s, and with an assumed right atrial pressure of 3 mmHg, the estimated right ventricular systolic pressure is 32.9 mmHg. Left Atrium: Left atrial size was moderately dilated. Right Atrium: Right atrial size was moderately dilated. Pericardium: There is no evidence of pericardial effusion. Mitral Valve: The mitral valve is degenerative in appearance. There is mild thickening of the mitral valve leaflet(s). Mild mitral annular calcification. Mild mitral valve regurgitation. Tricuspid Valve: The tricuspid valve is grossly normal. Tricuspid valve regurgitation is mild. Aortic Valve: The aortic valve is tricuspid. There is moderate calcification of the aortic valve. There is mild aortic valve annular calcification. Aortic valve regurgitation is not visualized. Moderate aortic stenosis is present. Aortic valve mean gradient measures 27.7 mmHg. Aortic valve peak gradient measures 49.7 mmHg. Aortic valve area, by VTI measures 1.14 cm. Pulmonic Valve: The pulmonic valve was grossly normal. Pulmonic valve regurgitation is mild. Aorta: The aortic root is normal in size and structure. Venous: The inferior vena cava is normal in size with greater than 50% respiratory variability, suggesting right atrial pressure of 3 mmHg. IAS/Shunts: No atrial level shunt detected by color flow Doppler.  LEFT VENTRICLE PLAX 2D LVIDd:         4.20 cm   Diastology LVIDs:         2.30 cm   LV e' medial:    5.66 cm/s LV PW:         1.20 cm   LV E/e' medial:  29.5 LV IVS:        1.10 cm   LV e' lateral:   9.36 cm/s LVOT diam:     1.70 cm   LV E/e' lateral: 17.8 LV SV:         92 LV SV Index:   61 LVOT Area:     2.27 cm  RIGHT VENTRICLE RV S prime:     15.70 cm/s TAPSE (M-mode): 3.0 cm LEFT ATRIUM              Index        RIGHT ATRIUM           Index LA diam:        4.00 cm 2.64 cm/m   RA Area:     19.80 cm LA Vol (A2C):   94.9 ml 62.73 ml/m  RA Volume:   63.40 ml  41.91 ml/m LA Vol (A4C):   63.1 ml 41.71 ml/m LA Biplane Vol: 80.3 ml 53.08 ml/m  AORTIC VALVE AV Area (Vmax):    1.17 cm AV Area (Vmean):   1.18 cm AV Area (VTI):     1.14 cm AV Vmax:           352.33 cm/s AV Vmean:          243.667 cm/s AV VTI:            0.805 m AV Peak Grad:      49.7 mmHg AV Mean Grad:      27.7 mmHg LVOT Vmax:         182.00 cm/s LVOT Vmean:        127.000 cm/s LVOT VTI:          0.404 m LVOT/AV VTI ratio: 0.50  AORTA Ao Root diam: 3.10 cm MITRAL VALVE                TRICUSPID VALVE MV Area (PHT): 3.65 cm     TR Peak grad:   41.2 mmHg MV Decel Time:  208 msec     TR Vmax:        321.00 cm/s MV E velocity: 167.00 cm/s MV A velocity: 130.00 cm/s  SHUNTS MV E/A ratio:  1.28         Systemic VTI:  0.40 m                             Systemic Diam: 1.70 cm Rozann Lesches MD Electronically signed by Rozann Lesches MD Signature Date/Time: 08/29/2021/4:29:06 PM    Final      Discharge Exam: Vitals:   09/02/21 0502 09/02/21 0806  BP: (!) 168/52   Pulse: 69   Resp: 16   Temp: 98.2 F (36.8 C)   SpO2: 92% 93%   Vitals:   09/01/21 1450 09/01/21 2124 09/02/21 0502 09/02/21 0806  BP: (!) 145/50 (!) 182/55 (!) 168/52   Pulse: 67 73 69   Resp: 19 16 16    Temp: 98 F (36.7 C) 98.6 F (37 C) 98.2 F (36.8 C)   TempSrc: Oral     SpO2: 96% 91% 92% 93%  Weight:      Height:        General: Pt is alert, awake, not in acute distress Cardiovascular: RRR, S1/S2 +, no rubs, no gallops Respiratory: CTA bilaterally, no wheezing, no rhonchi Abdominal: Soft, NT, ND, bowel sounds + Extremities: no edema, no cyanosis    The results of significant diagnostics from this hospitalization (including imaging, microbiology, ancillary and laboratory) are listed below for reference.     Microbiology: Recent Results (from  the past 240 hour(s))  Resp Panel by RT-PCR (Flu A&B, Covid) Nasopharyngeal Swab     Status: None   Collection Time: 08/31/21  8:29 PM   Specimen: Nasopharyngeal Swab; Nasopharyngeal(NP) swabs in vial transport medium  Result Value Ref Range Status   SARS Coronavirus 2 by RT PCR NEGATIVE NEGATIVE Final    Comment: (NOTE) SARS-CoV-2 target nucleic acids are NOT DETECTED.  The SARS-CoV-2 RNA is generally detectable in upper respiratory specimens during the acute phase of infection. The lowest concentration of SARS-CoV-2 viral copies this assay can detect is 138 copies/mL. A negative result does not preclude SARS-Cov-2 infection and should not be used as the sole basis for treatment or other patient management decisions. A negative result may occur with  improper specimen collection/handling, submission of specimen other than nasopharyngeal swab, presence of viral mutation(s) within the areas targeted by this assay, and inadequate number of viral copies(<138 copies/mL). A negative result must be combined with clinical observations, patient history, and epidemiological information. The expected result is Negative.  Fact Sheet for Patients:  EntrepreneurPulse.com.au  Fact Sheet for Healthcare Providers:  IncredibleEmployment.be  This test is no t yet approved or cleared by the [place] FDA and  has been authorized for detection and/or diagnosis of SARS-CoV-2 by FDA under an Emergency Use Authorization (EUA). This EUA will remain  in effect (meaning this test can be used) for the duration of the COVID-19 declaration under Section 564(b)(1) of the Act, 21 U.S.C.section 360bbb-3(b)(1), unless the authorization is terminated  or revoked sooner.       Influenza A by PCR NEGATIVE NEGATIVE Final   Influenza B by PCR NEGATIVE NEGATIVE Final    Comment: (NOTE) The Xpert Xpress SARS-CoV-2/FLU/RSV plus assay is intended as an aid in the diagnosis of  influenza from Nasopharyngeal swab specimens and should not be used as a sole basis for treatment.  Nasal washings and aspirates are unacceptable for Xpert Xpress SARS-CoV-2/FLU/RSV testing.  Fact Sheet for Patients: EntrepreneurPulse.com.au  Fact Sheet for Healthcare Providers: IncredibleEmployment.be  This test is not yet approved or cleared by the [place] FDA and has been authorized for detection and/or diagnosis of SARS-CoV-2 by FDA under an Emergency Use Authorization (EUA). This EUA will remain in effect (meaning this test can be used) for the duration of the COVID-19 declaration under Section 564(b)(1) of the Act, 21 U.S.C. section 360bbb-3(b)(1), unless the authorization is terminated or revoked.  Performed at [facility], [address], [place], [place] [address]      Labs: BNP (last 3 results) No results for input(s): BNP in the last 8760 hours. Basic Metabolic Panel: Recent Labs  Lab 08/31/21 1600 09/01/21 0506 09/02/21 0445  NA 129* 130* 132*  K 4.2 3.7 3.6  CL 92* 95* 98  CO2 27 26 28   GLUCOSE 116* 103* 106*  BUN 23 19 19   CREATININE 2.00* 1.74* 1.72*  CALCIUM 8.9 8.3* 8.5*  MG  --   --  1.8   Liver Function Tests: Recent Labs  Lab 08/31/21 1600  AST 16  ALT 12  ALKPHOS 54  BILITOT 0.6  PROT 7.0  ALBUMIN 3.2*   No results for input(s): LIPASE, AMYLASE in the last 168 hours. No results for input(s): AMMONIA in the last 168 hours. CBC: Recent Labs  Lab 08/31/21 1600 09/01/21 0506 09/02/21 0445  WBC 4.4 5.0 5.4  NEUTROABS 3.3  --   --   HGB 7.4* 10.1* 9.4*  HCT 22.5* 30.1* 29.3*  MCV 84.6 86.0 84.9  PLT 153 140* 149*   Cardiac Enzymes: No results for input(s): CKTOTAL, CKMB, CKMBINDEX, TROPONINI in the last 168 hours. BNP: Invalid input(s): POCBNP CBG: No results for input(s): GLUCAP in the last 168 hours. D-Dimer No results for input(s): DDIMER in the last 72 hours. Hgb A1c No results for  input(s): HGBA1C in the last 72 hours. Lipid Profile No results for input(s): CHOL, HDL, LDLCALC, TRIG, CHOLHDL, LDLDIRECT in the last 72 hours. Thyroid function studies Recent Labs    09/01/21 0506  TSH 1.584   Anemia work up No results for input(s): VITAMINB12, FOLATE, FERRITIN, TIBC, IRON, RETICCTPCT in the last 72 hours. Urinalysis    Component Value Date/Time   COLORURINE YELLOW 08/31/2021 1556   APPEARANCEUR CLEAR 08/31/2021 1556   APPEARANCEUR Clear 10/11/2019 1326   LABSPEC 1.010 08/31/2021 1556   PHURINE 7.5 08/31/2021 1556   GLUCOSEU NEGATIVE 08/31/2021 1556   HGBUR TRACE (A) 08/31/2021 1556   BILIRUBINUR NEGATIVE 08/31/2021 1556   BILIRUBINUR Negative 10/11/2019 1326   KETONESUR NEGATIVE 08/31/2021 1556   PROTEINUR TRACE (A) 08/31/2021 1556   UROBILINOGEN 0.2 11/27/2007 1051   NITRITE NEGATIVE 08/31/2021 1556   LEUKOCYTESUR NEGATIVE 08/31/2021 1556   Sepsis Labs Invalid input(s): PROCALCITONIN,  WBC,  LACTICIDVEN Microbiology Recent Results (from the past 240 hour(s))  Resp Panel by RT-PCR (Flu A&B, Covid) Nasopharyngeal Swab     Status: None   Collection Time: 08/31/21  8:29 PM   Specimen: Nasopharyngeal Swab; Nasopharyngeal(NP) swabs in vial transport medium  Result Value Ref Range Status   SARS Coronavirus 2 by RT PCR NEGATIVE NEGATIVE Final    Comment: (NOTE) SARS-CoV-2 target nucleic acids are NOT DETECTED.  The SARS-CoV-2 RNA is generally detectable in upper respiratory specimens during the acute phase of infection. The lowest concentration of SARS-CoV-2 viral copies this assay can detect is 138 copies/mL. A negative result does  not preclude SARS-Cov-2 infection and should not be used as the sole basis for treatment or other patient management decisions. A negative result may occur with  improper specimen collection/handling, submission of specimen other than nasopharyngeal swab, presence of viral mutation(s) within the areas targeted by this assay,  and inadequate number of viral copies(<138 copies/mL). A negative result must be combined with clinical observations, patient history, and epidemiological information. The expected result is Negative.  Fact Sheet for Patients:  EntrepreneurPulse.com.au  Fact Sheet for Healthcare Providers:  IncredibleEmployment.be  This test is no t yet approved or cleared by the [place] FDA and  has been authorized for detection and/or diagnosis of SARS-CoV-2 by FDA under an Emergency Use Authorization (EUA). This EUA will remain  in effect (meaning this test can be used) for the duration of the COVID-19 declaration under Section 564(b)(1) of the Act, 21 U.S.C.section 360bbb-3(b)(1), unless the authorization is terminated  or revoked sooner.       Influenza A by PCR NEGATIVE NEGATIVE Final   Influenza B by PCR NEGATIVE NEGATIVE Final    Comment: (NOTE) The Xpert Xpress SARS-CoV-2/FLU/RSV plus assay is intended as an aid in the diagnosis of influenza from Nasopharyngeal swab specimens and should not be used as a sole basis for treatment. Nasal washings and aspirates are unacceptable for Xpert Xpress SARS-CoV-2/FLU/RSV testing.  Fact Sheet for Patients: EntrepreneurPulse.com.au  Fact Sheet for Healthcare Providers: IncredibleEmployment.be  This test is not yet approved or cleared by the [place] FDA and has been authorized for detection and/or diagnosis of SARS-CoV-2 by FDA under an Emergency Use Authorization (EUA). This EUA will remain in effect (meaning this test can be used) for the duration of the COVID-19 declaration under Section 564(b)(1) of the Act, 21 U.S.C. section 360bbb-3(b)(1), unless the authorization is terminated or revoked.  Performed at [facility], [address], [place], [place] [address]      Time coordinating discharge: 35 minutes  SIGNED:   Rodena Goldmann, DO [facility] 09/02/2021, 8:39 AM  If 7PM-7AM, please contact night-coverage [email or web address]

## 2021-09-02 NOTE — Progress Notes (Signed)
Nsg Discharge Note  Admit Date:  08/31/2021 Discharge date: 09/02/2021   [name] to be D/C'd Home per MD order.  AVS completed.  Copy for chart, and copy for patient signed, and dated. Patient/caregiver able to verbalize understanding.  Discharge Medication: Allergies as of 09/02/2021       Reactions   Amlodipine Other (See Comments)   edema   Xeroform Occlusive Gauze Strip [bismuth Tribromoph-petrolatum]    Isosorbide Nitrate Nausea Only   Sulfonamide Derivatives Nausea Only, Rash   Terazosin Hives, Nausea Only        Medication List     STOP taking these medications    calcium-vitamin D 500-200 MG-UNIT tablet Commonly known as: OSCAL WITH D   guaiFENesin 600 MG 12 hr tablet Commonly known as: MUCINEX   lisinopril 20 MG tablet Commonly known as: ZESTRIL       TAKE these medications    acetaminophen 325 MG tablet Commonly known as: TYLENOL Take 325 mg by mouth daily as needed for moderate pain or headache.   albuterol (2.5 MG/3ML) 0.083% nebulizer solution Commonly known as: PROVENTIL Take 3 mLs (2.5 mg total) by nebulization every 4 (four) hours as needed for wheezing or shortness of breath.   albuterol 108 (90 Base) MCG/ACT inhaler Commonly known as: VENTOLIN HFA Inhale 2 puffs into the lungs every 6 (six) hours as needed for wheezing or shortness of breath.   atorvastatin 20 MG tablet Commonly known as: LIPITOR TAKE 1/2 TABLET BY MOUTH EVERY DAY   azelastine 0.1 % nasal spray Commonly known as: ASTELIN Place 1 spray into both nostrils 2 (two) times daily.   benzonatate 200 MG capsule Commonly known as: TESSALON Take 1 capsule (200 mg total) by mouth 2 (two) times daily as needed for cough.   bisoprolol 5 MG tablet Commonly known as: ZEBETA Take 1 tablet (5 mg total) by mouth daily.   budesonide-formoterol 160-4.5 MCG/ACT inhaler Commonly known as: Symbicort Inhale 2 puffs into the lungs 2 (two) times daily.   cholecalciferol 1000 units  tablet Commonly known as: VITAMIN D Take 1,000 Units by mouth daily.   diclofenac Sodium 1 % Gel Commonly known as: VOLTAREN Apply 2 g topically 4 (four) times daily.   ferrous sulfate 325 (65 FE) MG tablet Take 325 mg by mouth 2 (two) times daily with a meal.   flecainide 50 MG tablet Commonly known as: TAMBOCOR Take 1 tablet (50 mg total) by mouth 2 (two) times daily.   fluticasone 50 MCG/ACT nasal spray Commonly known as: FLONASE PLACE 1 SPRAY INTO BOTH NOSTRILS 2 (TWO) TIMES DAILY   hydrALAZINE 100 MG tablet Commonly known as: APRESOLINE Take 100 mg by mouth 2 (two) times daily.   hydroxypropyl methylcellulose / hypromellose 2.5 % ophthalmic solution Commonly known as: ISOPTO TEARS / GONIOVISC Place 1 drop into both eyes daily.   letrozole 2.5 MG tablet Commonly known as: FEMARA TAKE 1 TABLET BY MOUTH EVERY DAY   levothyroxine 75 MCG tablet Commonly known as: SYNTHROID Take 1 tablet (75 mcg total) by mouth daily.   losartan 25 MG tablet Commonly known as: COZAAR Take 1 tablet (25 mg total) by mouth daily.   mirtazapine 7.5 MG tablet Commonly known as: REMERON Take 1 tablet (7.5 mg total) by mouth at bedtime.   ondansetron 4 MG disintegrating tablet Commonly known as: Zofran ODT Take 1 tablet (4 mg total) by mouth every 8 (eight) hours as needed.   pantoprazole 40 MG tablet Commonly known as: Protonix  Take 1 tablet (40 mg total) by mouth 2 (two) times daily.   Procare Spacer/Adult Mask Devi Use as directed        Discharge Assessment: Vitals:   09/02/21 0502 09/02/21 0806  BP: (!) 168/52   Pulse: 69   Resp: 16   Temp: 98.2 F (36.8 C)   SpO2: 92% 93%   Skin clean, dry and intact without evidence of skin break down, no evidence of skin tears noted. IV catheter discontinued intact. Site without signs and symptoms of complications - no redness or edema noted at insertion site, patient denies c/o pain - only slight tenderness at site.  Dressing with  slight pressure applied.  D/c Instructions-Education: Discharge instructions given to patient/family with verbalized understanding. D/c education completed with patient/family including follow up instructions, medication list, d/c activities limitations if indicated, with other d/c instructions as indicated by MD - patient able to verbalize understanding, all questions fully answered. Patient instructed to return to ED, call 911, or call MD for any changes in condition.  Patient escorted via [place], and D/C home via private auto.  Dorcas Mcmurray, LPN 1/88/6773 73:66 AM

## 2021-09-02 NOTE — Telephone Encounter (Signed)
Patient needs hospital follow-up. Patient of Dr. Jenetta Downer. Thanks!

## 2021-09-03 ENCOUNTER — Encounter: Payer: Self-pay | Admitting: *Deleted

## 2021-09-03 ENCOUNTER — Telehealth: Payer: Self-pay

## 2021-09-03 ENCOUNTER — Ambulatory Visit: Payer: PPO | Admitting: Family Medicine

## 2021-09-03 LAB — SURGICAL PATHOLOGY

## 2021-09-03 NOTE — Telephone Encounter (Signed)
Transition Care Management Follow-up Telephone Call Date of discharge and from where: 09/02/21 - [facility] - symptomatic anemia How have you been since you were released from the hospital? Doing better, just weak and tired Any questions or concerns? No  Items Reviewed: Did the pt receive and understand the discharge instructions provided? Yes  Medications obtained and verified? Yes  Other? No  Any new allergies since your discharge? No  Dietary orders reviewed? Yes Do you have support at home? Yes   Home Care and Equipment/Supplies: Were home health services ordered? no If so, what is the name of the agency? N/a  Has the agency set up a time to come to the patient's home? not applicable Were any new equipment or medical supplies ordered?  No What is the name of the medical supply agency? N/a Were you able to get the supplies/equipment? not applicable Do you have any questions related to the use of the equipment or supplies? No  Functional Questionnaire: (I = Independent and D = Dependent) ADLs: I - with assistance  Bathing/Dressing- I - with assistance  Meal Prep- D  Eating- I  Maintaining continence- I  Transferring/Ambulation- I - sometimes needs assistance  Managing Meds- I  Follow up appointments reviewed:  PCP Hospital f/u appt confirmed? Yes  Scheduled to see [facility] on 09/11/21 @ 11:30. [facility] f/u appt confirmed? Yes  Scheduled to see oncology on 09/25/21 @ 2:45, GI on 09/30/21 @ 11:45, and pulmonology on 10/18/21 @ 11am. Are transportation arrangements needed? No  If their condition worsens, is the pt aware to call PCP or go to the Emergency Dept.? Yes Was the patient provided with contact information for the PCP's office or ED? Yes Was to pt encouraged to call back with questions or concerns? Yes

## 2021-09-04 DIAGNOSIS — N184 Chronic kidney disease, stage 4 (severe): Secondary | ICD-10-CM | POA: Diagnosis not present

## 2021-09-04 DIAGNOSIS — D61818 Other pancytopenia: Secondary | ICD-10-CM | POA: Diagnosis not present

## 2021-09-04 DIAGNOSIS — I701 Atherosclerosis of renal artery: Secondary | ICD-10-CM | POA: Diagnosis not present

## 2021-09-04 DIAGNOSIS — Z95828 Presence of other vascular implants and grafts: Secondary | ICD-10-CM | POA: Diagnosis not present

## 2021-09-08 DIAGNOSIS — J9601 Acute respiratory failure with hypoxia: Secondary | ICD-10-CM | POA: Diagnosis not present

## 2021-09-11 ENCOUNTER — Encounter: Payer: Self-pay | Admitting: Family Medicine

## 2021-09-11 ENCOUNTER — Ambulatory Visit (INDEPENDENT_AMBULATORY_CARE_PROVIDER_SITE_OTHER): Payer: PPO | Admitting: Family Medicine

## 2021-09-11 VITALS — BP 133/61 | HR 63 | Temp 97.6°F | Ht 65.0 in | Wt 103.0 lb

## 2021-09-11 DIAGNOSIS — I350 Nonrheumatic aortic (valve) stenosis: Secondary | ICD-10-CM | POA: Diagnosis not present

## 2021-09-11 DIAGNOSIS — D500 Iron deficiency anemia secondary to blood loss (chronic): Secondary | ICD-10-CM

## 2021-09-11 DIAGNOSIS — K922 Gastrointestinal hemorrhage, unspecified: Secondary | ICD-10-CM | POA: Diagnosis not present

## 2021-09-11 DIAGNOSIS — I10 Essential (primary) hypertension: Secondary | ICD-10-CM

## 2021-09-11 DIAGNOSIS — Z09 Encounter for follow-up examination after completed treatment for conditions other than malignant neoplasm: Secondary | ICD-10-CM | POA: Diagnosis not present

## 2021-09-11 MED ORDER — BISOPROLOL FUMARATE 5 MG PO TABS: 5.0000 mg | ORAL_TABLET | Freq: Every day | ORAL | 3 refills | Status: DC

## 2021-09-11 MED ORDER — LOSARTAN POTASSIUM 25 MG PO TABS: 25.0000 mg | ORAL_TABLET | Freq: Every day | ORAL | 3 refills | Status: DC

## 2021-09-11 MED ORDER — PANTOPRAZOLE SODIUM 40 MG PO TBEC: 40.0000 mg | DELAYED_RELEASE_TABLET | Freq: Two times a day (BID) | ORAL | 2 refills | Status: DC

## 2021-09-11 NOTE — Patient Instructions (Signed)
You had labs performed today.  You will be contacted with the results of the labs once they are available, usually in the next 3 business days for routine lab work.  If you have an active my chart account, they will be released to your MyChart.  If you prefer to have these labs released to you via telephone, please let us know.   STOP Tenoretic.  The Bisoprolol is what your specialist wants you taking CONTINUE the Pantoprazole for the stomach ulcer unless you are instructed otherwise by the gastric doctor CONTINUE Losartan.  Refills have been sent  Aortic Valve Stenosis Aortic valve stenosis is a narrowing of the aortic valve in the heart. The aortic valve opens and closes to regulate blood flow between the left side of the heart (left ventricle) and the artery that leads away from the heart (aorta). When the aortic valve becomes narrow, it is difficult for the heart to pump blood out to the body, which causes the heart to work harder. The extra work can weaken the heart muscle over time. Aortic valve stenosis can range from mild to severe. If it is not treated, it can become more severe over time and lead to heart failure. What are the causes? This condition may be caused by: Buildup of calcium around and on the aortic valve. This can occur with aging. This is the most common cause of aortic valve stenosis. A heart problem that developed in the womb (birth defect). Rheumatic fever. Radiation to the chest. What increases the risk? You may be more likely to develop this condition if: You are older than age 88. You were born with an abnormal bicuspid valve. What are the signs or symptoms? You may not have any symptoms until your condition becomes severe. It may take 10-20 years for mild or moderate aortic valve stenosis to become severe. Symptoms may include: Shortness of breath. This may get worse during physical activity. Feeling unusually weak and tired (fatigue). Extreme discomfort in the  chest, neck, or arm during physical activity (angina). A heartbeat that is irregular or faster than normal (palpitations). Dizziness or fainting. This may happen when you get physically tired or after you take certain heart medicines, such as nitroglycerin. How is this diagnosed? This condition may be diagnosed with: A physical exam. Echocardiogram. This is a type of imaging test that uses sound waves (ultrasound) to make images of your heart. There are two kinds of this test that may be used. Transthoracic echocardiogram (TTE). For this type, a wand-like tool (transducer) is moved over your chest to create ultrasound images that are recorded by a computer. Transesophageal echocardiogram (TEE). For this type, a flexible tube (probe) is inserted down the part of the body that moves food from your mouth to your stomach (esophagus). The heart and the esophagus are close to each other. Your health care provider will use the probe to take clear, detailed pictures of the heart. Cardiac catheterization. For this procedure, a small, thin tube (catheter) is passed through a large vein in your neck, groin, or arm. The catheter is used to get information about arteries, structures, blood pressure, and oxygen levels in your heart. Stress tests. These are tests that evaluate the blood supply to your heart and your heart's response to exercise. You may work with a health care provider who specializes in the heart (cardiologist) for diagnosis and treatment. How is this treated? Treatment depends on how severe your condition is and what your symptoms are. You will  need to have your heart checked regularly to make sure that your condition is not getting worse or causing serious problems. Treatment may also include: Surgery to replace your aortic valve. This is the most common treatment for aortic valve stenosis, and it is the only treatment to cure the condition. Several types of surgeries are available. The surgery  may be done: Through a large incision over your heart (open-heart surgery). Through small incisions, using a flexible tube called a catheter (transcatheter aortic valve replacement, TAVR). Medicines that help to keep your heart rate regular. Medicines that thin your blood (anticoagulants) to prevent blood clots. Antibiotic medicines to help prevent infection. If your condition is mild, you may only need regular follow-up visits for monitoring. Follow these instructions at home: Lifestyle Limit alcohol intake to no more than 1 drink a day for nonpregnant women and 2 drinks a day for men. One drink equals 12 oz of beer, 5 oz of wine, or 1 oz of hard liquor. Do not use any products that contain nicotine or tobacco, such as cigarettes and e-cigarettes. If you need help quitting, ask your health care provider. Work with your health care provider to manage your blood pressure and cholesterol. Maintain a healthy weight. Eating and drinking  Eat a heart-healthy diet that includes plenty of fresh fruits and vegetables, whole grains, lean protein, and low-fat or nonfat dairy. Limit how much caffeine you drink. Caffeine can affect your heart's rate and rhythm. Avoid foods that are: High in salt (sodium), saturated fat, or sugar. Canned or highly processed. Maceo Pro. Follow instructions from your health care provider about any other eating or drinking restrictions. Activity Exercise regularly and return to your normal activities as told by your health care provider. Ask your health care provider what amount and type of physical activity is safe for you. If your aortic valve stenosis is mild, you may only need to avoid very intense physical activity, such as heavy weight lifting. The more severe your aortic valve stenosis is, the more activities you may need to avoid. If you are taking blood thinners: Before you take any medicines that contain aspirin or NSAIDs, talk with your health care provider. These  medicines increase your risk for dangerous bleeding. Take your medicine exactly as told, at the same time every day. Avoid activities that could cause injury or bruising, and follow instructions about how to prevent falls. Wear a medical alert bracelet or carry a card that lists what medicines you take. General instructions Take over-the-counter and prescription medicines only as told by your health care provider. If you were prescribed an antibiotic, take it as told by your health care provider. Do not stop taking the antibiotic even if you start to feel better. If you are a woman and you plan to become pregnant, talk with your health care provider before you become pregnant. Before you have any type of medical or dental procedure or surgery, tell all health care providers that you have aortic valve stenosis. This may affect the treatment that you receive. Keep all follow-up visits as told by your health care provider. This is important. Contact a health care provider if: You have a fever. Get help right away if: You develop any of the following symptoms: Chest pain. Chest tightness. Shortness of breath. Trouble breathing. You feel light-headed. You feel like you might faint. Your heartbeat is irregular or faster than normal. These symptoms may represent a serious problem that is an emergency. Do not wait to see if  the symptoms will go away. Get medical help right away. Call your local emergency services (911 in the [place] Do not drive yourself to the hospital. Summary Aortic valve stenosis is a narrowing of the aortic valve in the heart. The aortic valve opens and closes to regulate blood flow between the left side of the heart (left ventricle) and the artery that leads away from the heart (aorta). Aortic valve stenosis can range from mild to severe. If it is not treated, it can become more severe over time and lead to heart failure. Treatment depends on how severe your condition is and  what your symptoms are. You will need to have your heart checked regularly to make sure that your condition is not getting worse or causing serious problems. Exercise regularly and return to your normal activities as told by your health care provider. Ask your health care provider what amount and type of physical activity is safe for you. This information is not intended to replace advice given to you by your health care provider. Make sure you discuss any questions you have with your health care provider. Document Revised: 01/03/2021 Document Reviewed: 01/18/2020 Elsevier Patient Education  2022 [employer].

## 2021-09-11 NOTE — Progress Notes (Signed)
Subjective: CC: Hospital discharge follow-up PCP: [name], DO YJE:[name] is a 88 y.o. female presenting to clinic today for:  1.  Hospital discharge follow-up for symptomatic anemia Patient was admitted to the hospital on 08/31/2021 and discharged on 09/02/2021 for symptomatic anemia.  She had been admitted after she was found to have an initial hemoglobin of 7.4 with a general downward trend.  She was found to be Hemoccult positive and upper GI was performed which ultimately revealed a small ulcer in the duodenum.  She was placed on Protonix twice daily and has continue this medication as directed.  Additionally, she was denoting a cough for several months and was taken off of her lisinopril and placed on losartan.  She denies any known GI bleeding but again did not even know that she was bleeding initially due to her stools always being black from taking iron.  She takes iron twice daily.  She denies any nausea, vomiting, abdominal pain, dizziness, lightheadedness.  Energy has gotten a lot better since being transfused with a 2 units of packed red blood cells.    She is to see gastroenterology in the next month or so.  Hemoglobin at discharge was 9.4.  Platelets 149.  ROS: Per HPI  Allergies  Allergen Reactions   Amlodipine Other (See Comments)    edema   Xeroform Occlusive Gauze Strip [Bismuth Tribromoph-Petrolatum]    Isosorbide Nitrate Nausea Only   Sulfonamide Derivatives Nausea Only and Rash   Terazosin Hives and Nausea Only   Past Medical History:  Diagnosis Date   Anemia    Arthritis    Asthma    Atrial fibrillation ([place])    Basal cell carcinoma 02/06/2009   Left ear targus- ([facility])   Basal cell carcinoma 09/28/2008   Right back-(CX35FU)   CKD (chronic kidney disease), stage III (HCC)    Heart murmur    Hyperlipidemia    Hypertension    Hypothyroidism    Nodule of right lung    Right upper lobe   PONV (postoperative nausea and vomiting)    Renal  insufficiency    Chronic   Renal vascular disease    Right renal artery stenosis (HCC) 05/09/2015   Squamous cell carcinoma of skin 09/07/2019   KA-Left shin-txpbx   Vertigo     Current Outpatient Medications:    acetaminophen (TYLENOL) 325 MG tablet, Take 325 mg by mouth daily as needed for moderate pain or headache., Disp: , Rfl:    albuterol (PROVENTIL) (2.5 MG/3ML) 0.083% nebulizer solution, Take 3 mLs (2.5 mg total) by nebulization every 4 (four) hours as needed for wheezing or shortness of breath., Disp: 75 mL, Rfl: 12   albuterol (VENTOLIN HFA) 108 (90 Base) MCG/ACT inhaler, Inhale 2 puffs into the lungs every 6 (six) hours as needed for wheezing or shortness of breath., Disp: 8 g, Rfl: 2   atorvastatin (LIPITOR) 20 MG tablet, TAKE 1/2 TABLET BY MOUTH EVERY DAY, Disp: 45 tablet, Rfl: 1   azelastine (ASTELIN) 0.1 % nasal spray, Place 1 spray into both nostrils 2 (two) times daily., Disp: 30 mL, Rfl: 12   benzonatate (TESSALON) 200 MG capsule, Take 1 capsule (200 mg total) by mouth 2 (two) times daily as needed for cough., Disp: 90 capsule, Rfl: 1   bisoprolol (ZEBETA) 5 MG tablet, Take 1 tablet (5 mg total) by mouth daily., Disp: 30 tablet, Rfl: 0   budesonide-formoterol (SYMBICORT) 160-4.5 MCG/ACT inhaler, Inhale 2 puffs into the lungs 2 (two)  times daily., Disp: 1 each, Rfl: 12   cholecalciferol (VITAMIN D) 1000 units tablet, Take 1,000 Units by mouth daily., Disp: , Rfl:    diclofenac Sodium (VOLTAREN) 1 % GEL, Apply 2 g topically 4 (four) times daily., Disp: 100 g, Rfl: 0   ferrous sulfate 325 (65 FE) MG tablet, Take 325 mg by mouth 2 (two) times daily with a meal., Disp: , Rfl:    flecainide (TAMBOCOR) 50 MG tablet, Take 1 tablet (50 mg total) by mouth 2 (two) times daily., Disp: 180 tablet, Rfl: 3   fluticasone (FLONASE) 50 MCG/ACT nasal spray, PLACE 1 SPRAY INTO BOTH NOSTRILS 2 (TWO) TIMES DAILY, Disp: 16 mL, Rfl: 5   hydrALAZINE (APRESOLINE) 100 MG tablet, Take 100 mg by mouth 2  (two) times daily., Disp: , Rfl:    hydroxypropyl methylcellulose / hypromellose (ISOPTO TEARS / GONIOVISC) 2.5 % ophthalmic solution, Place 1 drop into both eyes daily., Disp: , Rfl:    letrozole (FEMARA) 2.5 MG tablet, TAKE 1 TABLET BY MOUTH EVERY DAY, Disp: 90 tablet, Rfl: 4   levothyroxine (SYNTHROID) 75 MCG tablet, Take 1 tablet (75 mcg total) by mouth daily., Disp: 90 tablet, Rfl: 3   losartan (COZAAR) 25 MG tablet, Take 1 tablet (25 mg total) by mouth daily., Disp: 30 tablet, Rfl: 0   mirtazapine (REMERON) 7.5 MG tablet, Take 1 tablet (7.5 mg total) by mouth at bedtime., Disp: 90 tablet, Rfl: 1   ondansetron (ZOFRAN ODT) 4 MG disintegrating tablet, Take 1 tablet (4 mg total) by mouth every 8 (eight) hours as needed., Disp: 30 tablet, Rfl: 2   pantoprazole (PROTONIX) 40 MG tablet, Take 1 tablet (40 mg total) by mouth 2 (two) times daily., Disp: 180 tablet, Rfl: 0   Spacer/Aero-Holding Chambers (PROCARE SPACER/ADULT MASK) DEVI, Use as directed, Disp: 1 each, Rfl: 0 Social History   Socioeconomic History   Marital status: Widowed    Spouse name: Not on file   Number of children: 2   Years of education: 5   Highest education level: Not on file  Occupational History   Occupation: Retired  Tobacco Use   Smoking status: Never   Smokeless tobacco: Never  Vaping Use   Vaping Use: Never used  Substance and Sexual Activity   Alcohol use: No   Drug use: No   Sexual activity: Never  Other Topics Concern   Not on file  Social History Narrative   Lives alone, but her daughter stays there at night   Ambidextrous (writes with left hand).   No caffeine use.   Social Determinants of Health   Financial Resource Strain: Low Risk    Difficulty of Paying Living Expenses: Not hard at all  Food Insecurity: No Food Insecurity   Worried About Charity fundraiser in the Last Year: Never true   [place] in the Last Year: Never true  Transportation Needs: No Transportation Needs   Lack of  Transportation (Medical): No   Lack of Transportation (Non-Medical): No  Physical Activity: Inactive   Days of Exercise per Week: 0 days   Minutes of Exercise per Session: 0 min  Stress: No Stress Concern Present   Feeling of Stress : Not at all  Social Connections: Moderately Isolated   Frequency of Communication with Friends and Family: More than three times a week   Frequency of Social Gatherings with Friends and Family: More than three times a week   Attends Religious Services: More than 4 times per  year   Active Member of Clubs or Organizations: No   Attends Archivist Meetings: Never   Marital Status: Widowed  Human resources officer Violence: Not At Risk   Fear of Current or Ex-Partner: No   Emotionally Abused: No   Physically Abused: No   Sexually Abused: No   Family History  Problem Relation Age of Onset   Thyroid disease Mother    Stroke Father    Hypertension Brother    Lung cancer Maternal Uncle    Cancer Maternal Grandmother        Liver   Lung cancer Maternal Uncle    Hypertension Daughter    Hypercholesterolemia Daughter    Hypercholesterolemia Daughter     Objective: Office vital signs reviewed. BP 133/61    Pulse 63    Temp 97.6 F (36.4 C)    Ht 5\' 5"  (1.651 m)    Wt 103 lb (46.7 kg)    SpO2 96%    BMI 17.14 kg/m   Physical Examination:  General: Awake, alert, nontoxic elderly female, No acute distress HEENT: No conjunctival pallor Cardio: Irregularly irregular with rate control, S1S2 heard, no murmurs appreciated Pulm: clear to auscultation bilaterally, no wheezes, rhonchi or rales; normal work of breathing on room air MSK: Ambulating independently  Assessment/ Plan: 88 y.o. female   Blood loss anemia - Plan: CBC with Differential  Hospital discharge follow-up - Plan: CBC with Differential  UGI bleed - Plan: pantoprazole (PROTONIX) 40 MG tablet  Essential hypertension - Plan: bisoprolol (ZEBETA) 5 MG tablet, losartan (COZAAR) 25 MG  tablet  Aortic stenosis, moderate  I reviewed her hospital notes, lab results.  We will recheck CBC today to ensure stability of hemoglobin.  Calcium was mildly low on her 09/02/2021 labs.  Encouraged to continue calcium and vitamin D combination.  We discussed ways to promote absorption of her iron tablet including use of vitamin C.  She is no longer symptomatically anemic.  Continue Protonix as directed by GI.  I went ahead and extended refills on this in the event that she lapses before she is able to see them.  Blood pressure was stable and controlled .  Bisoprolol and losartan renewed.  Would like this patient see me in the next 3 months that we can recheck levels, she will reach out to me should any concerns arise  Additionally, patient requested that I review her echocardiogram with her.  Aortic stenosis to moderate degree and mild mitral valve regurgitation noted but she is totally asymptomatic so we discussed red flag signs symptoms which would warrant further evaluation and handout was provided today  No orders of the defined types were placed in this encounter.  No orders of the defined types were placed in this encounter.   Today's visit is for Transitional Care Management.  The patient was discharged from [facility] on 09/02/21 with a primary diagnosis of Symptomatic anemia.   Contact with the patient and/or caregiver, by a clinical staff member, was made on 09/03/2021 and was documented as a telephone encounter within the EMR.  Through chart review and discussion with the patient I have determined that management of their condition is of moderate complexity.    [name], DO [place] [phone]

## 2021-09-12 LAB — CBC WITH DIFFERENTIAL/PLATELET
Basophils Absolute: 0 10*3/uL (ref 0.0–0.2)
Basos: 0 %
EOS (ABSOLUTE): 0 10*3/uL (ref 0.0–0.4)
Eos: 0 %
Hematocrit: 32.1 % — ABNORMAL LOW (ref 34.0–46.6)
Hemoglobin: 10.2 g/dL — ABNORMAL LOW (ref 11.1–15.9)
Immature Grans (Abs): 0.1 10*3/uL (ref 0.0–0.1)
Immature Granulocytes: 1 %
Lymphocytes Absolute: 1 10*3/uL (ref 0.7–3.1)
Lymphs: 22 %
MCH: 27 pg (ref 26.6–33.0)
MCHC: 31.8 g/dL (ref 31.5–35.7)
MCV: 85 fL (ref 79–97)
Monocytes Absolute: 0.3 10*3/uL (ref 0.1–0.9)
Monocytes: 7 %
Neutrophils Absolute: 3.2 10*3/uL (ref 1.4–7.0)
Neutrophils: 70 %
Platelets: 142 10*3/uL — ABNORMAL LOW (ref 150–450)
RBC: 3.78 x10E6/uL (ref 3.77–5.28)
RDW: 15.6 % — ABNORMAL HIGH (ref 11.7–15.4)
WBC: 4.6 10*3/uL (ref 3.4–10.8)

## 2021-09-18 ENCOUNTER — Other Ambulatory Visit (HOSPITAL_COMMUNITY): Payer: PPO

## 2021-09-19 ENCOUNTER — Telehealth (HOSPITAL_COMMUNITY): Payer: Self-pay | Admitting: Dietician

## 2021-09-19 ENCOUNTER — Encounter (HOSPITAL_COMMUNITY): Payer: PPO | Admitting: Dietician

## 2021-09-19 NOTE — Telephone Encounter (Signed)
Nutrition Follow-up:  Patient with right breast cancer. She is receiving Letrozole -noted recent hospital admissions: 12/15-12/22 for acute hypoxemic respiratory failure secondary to bronchitis 1/14-1/16 for symptomatic anemia in the setting of upper GIB s/p 2 units PRBC; endoscopy 1/15 with findings of small duodenal ulcer without active bleeding   Spoke with patient via telephone. She is glad to be home from hospital, enjoying spending time with great-grand baby, reports feeling some better. Patient reports decreased appetite in the last few months. Patient is taking appetite stimulant (remeron). She is eating 2 small meals and drinking one Ensure. Patient reports breakfast does not appeal to her anymore. She recalls mashed potatoes, mac/cheese, meatloaf for lunch. Patient ate bowl of vegetable soup for dinner. Patient denies nausea, vomiting, diarrhea, constipation.   Medications: protonix, cozaar, tessalon, tambocor, remeron  Labs: 1/25 labs reviewed   Anthropometrics: Last weight 103 lb on 1/25 decreased 3.6% from 106 lb 14.8 oz on 1/15; significant   1/5 - 106 lb 6.4 oz 12/15 - 100 lb 5 oz  11/14 - 112 lb 9.6 oz   NUTRITION DIAGNOSIS: Inadequate oral intake ongoing   MALNUTRITION DIAGNOSIS: noted moderate malnutrition diagnosed 08/02/21 by inpatient RD; suspect this is ongoing   INTERVENTION:  Encouraged high calorie high protein foods - will mail additional copy of snack ideas Patient agreeable to drinking 2 Ensure Plus/day - will mail coupons Continue taking appetite stimulant  Patient has contact information    MONITORING, EVALUATION, GOAL: weight trends, intake    NEXT VISIT: via telephone ~6 weeks

## 2021-09-20 ENCOUNTER — Ambulatory Visit (INDEPENDENT_AMBULATORY_CARE_PROVIDER_SITE_OTHER): Payer: PPO | Admitting: Nurse Practitioner

## 2021-09-20 ENCOUNTER — Encounter: Payer: Self-pay | Admitting: Nurse Practitioner

## 2021-09-20 VITALS — BP 155/65 | HR 68 | Temp 98.2°F | Resp 20 | Ht 65.0 in | Wt 105.0 lb

## 2021-09-20 DIAGNOSIS — R051 Acute cough: Secondary | ICD-10-CM

## 2021-09-20 DIAGNOSIS — R0981 Nasal congestion: Secondary | ICD-10-CM

## 2021-09-20 MED ORDER — AZITHROMYCIN 250 MG PO TABS: ORAL_TABLET | ORAL | 0 refills | Status: DC

## 2021-09-20 MED ORDER — PREDNISONE 20 MG PO TABS: 40.0000 mg | ORAL_TABLET | Freq: Every day | ORAL | 0 refills | Status: AC

## 2021-09-20 MED ORDER — HYDROCODONE BIT-HOMATROP MBR 5-1.5 MG/5ML PO SOLN: 5.0000 mL | Freq: Four times a day (QID) | ORAL | 0 refills | Status: DC | PRN

## 2021-09-20 MED ORDER — DOXYCYCLINE HYCLATE 100 MG PO TABS: 100.0000 mg | ORAL_TABLET | Freq: Two times a day (BID) | ORAL | 0 refills | Status: DC

## 2021-09-20 NOTE — Progress Notes (Addendum)
Subjective:    Patient ID: [name], female    DOB: January 22, 1934, 88 y.o.   MRN: [id number]   Chief Complaint: Cough (/) and Nasal Congestion   Cough This is a new problem. The current episode started in the past 7 days. The problem has been waxing and waning. The problem occurs constantly. The cough is Productive of sputum. Associated symptoms include ear pain, nasal congestion, postnasal drip and rhinorrhea. Pertinent negatives include no chills, fever, sore throat or shortness of breath. Nothing aggravates the symptoms. She has tried OTC cough suppressant for the symptoms. The treatment provided mild relief. Her past medical history is significant for bronchitis.      Review of Systems  Constitutional:  Positive for fatigue. Negative for chills and fever.  HENT:  Positive for ear pain, postnasal drip and rhinorrhea. Negative for sore throat.   Respiratory:  Positive for cough. Negative for shortness of breath.       Objective:   Physical Exam Vitals reviewed.  Constitutional:      Appearance: Normal appearance.  HENT:     Right Ear: Tympanic membrane normal.     Left Ear: Tympanic membrane normal.     Nose: Congestion and rhinorrhea present.     Mouth/Throat:     Mouth: Mucous membranes are moist.     Pharynx: No oropharyngeal exudate or posterior oropharyngeal erythema.  Eyes:     Pupils: Pupils are equal, round, and reactive to light.  Cardiovascular:     Rate and Rhythm: Normal rate.  Pulmonary:     Effort: Pulmonary effort is normal.     Breath sounds: Normal breath sounds.     Comments: Deep wet cough Skin:    General: Skin is warm.  Neurological:     General: No focal deficit present.     Mental Status: She is alert and oriented to person, place, and time.  Psychiatric:        Mood and Affect: Mood normal.        Behavior: Behavior normal.    BP (!) 155/65    Pulse 68    Temp 98.2 F (36.8 C) (Temporal)    Resp 20    Ht 5\' 5"  (1.651 m)    Wt 105 lb  (47.6 kg)    SpO2 95%    BMI 17.47 kg/m        Assessment & Plan:  [name] in today with chief complaint of Cough (/) and Nasal Congestion   1. Nasal congestion - Novel Coronavirus, NAA ([facility])  2. Acute cough 1. Take meds as prescribed 2. Use a cool mist humidifier especially during the winter months and when heat has been humid. 3. Use saline nose sprays frequently 4. Saline irrigations of the nose can be very helpful if done frequently.  * 4X daily for 1 week*  * Use of a nettie pot can be helpful with this. Follow directions with this* 5. Drink plenty of fluids 6. Keep thermostat turn down low 7.For any cough or congestion- hycodan with sedation precautions 8. For fever or aces or pains- take tylenol or ibuprofen appropriate for age and weight.  * for fevers greater than 101 orally you may alternate ibuprofen and tylenol every  3 hours.    - azithromycin (ZITHROMAX Z-PAK) 250 MG tablet; As directed  Dispense: 6 tablet; Refill: 0 - HYDROcodone bit-homatropine (HYCODAN) 5-1.5 MG/5ML syrup; Take 5 mLs by mouth every 6 (six) hours as needed for cough.  Dispense: 120 mL; Refill: 0 - predniSONE (DELTASONE) 20 MG tablet; Take 2 tablets (40 mg total) by mouth daily with breakfast for 5 days. 2 po daily for 5 days  Dispense: 10 tablet; Refill: 0    The above assessment and management plan was discussed with the patient. The patient verbalized understanding of and has agreed to the management plan. Patient is aware to call the clinic if symptoms persist or worsen. Patient is aware when to return to the clinic for a follow-up visit. Patient educated on when it is appropriate to go to the emergency department.   Mary-Margaret Hassell Done, FNP  Addendum: Zpak interacts with flecanide- changed to doxycycline 100mg  bid for 10 days

## 2021-09-20 NOTE — Patient Instructions (Signed)
1. Take meds as prescribed 2. Use a cool mist humidifier especially during the winter months and when heat has been humid. 3. Use saline nose sprays frequently 4. Saline irrigations of the nose can be very helpful if done frequently.  * 4X daily for 1 week*  * Use of a nettie pot can be helpful with this. Follow directions with this* 5. Drink plenty of fluids 6. Keep thermostat turn down low 7.For any cough or congestion- hycodan with sedation precautions 8. For fever or aces or pains- take tylenol or ibuprofen appropriate for age and weight.  * for fevers greater than 101 orally you may alternate ibuprofen and tylenol every  3 hours.    

## 2021-09-20 NOTE — Addendum Note (Signed)
Addended by: Chevis Pretty on: 09/20/2021 03:28 PM   Modules accepted: Orders

## 2021-09-21 LAB — SARS-COV-2, NAA 2 DAY TAT

## 2021-09-21 LAB — NOVEL CORONAVIRUS, NAA: SARS-CoV-2, NAA: NOT DETECTED

## 2021-09-24 ENCOUNTER — Other Ambulatory Visit (HOSPITAL_COMMUNITY): Payer: Self-pay

## 2021-09-24 DIAGNOSIS — C50511 Malignant neoplasm of lower-outer quadrant of right female breast: Secondary | ICD-10-CM

## 2021-09-24 NOTE — Progress Notes (Signed)
Repeat labs ordered per Dr. Tomie China note from 04/15/21.

## 2021-09-25 ENCOUNTER — Other Ambulatory Visit: Payer: Self-pay

## 2021-09-25 ENCOUNTER — Inpatient Hospital Stay (HOSPITAL_COMMUNITY): Payer: PPO | Attending: Hematology | Admitting: Hematology

## 2021-09-25 ENCOUNTER — Other Ambulatory Visit (HOSPITAL_COMMUNITY): Payer: Self-pay | Admitting: *Deleted

## 2021-09-25 ENCOUNTER — Inpatient Hospital Stay (HOSPITAL_COMMUNITY): Payer: PPO

## 2021-09-25 ENCOUNTER — Other Ambulatory Visit (HOSPITAL_COMMUNITY): Payer: Self-pay | Admitting: Hematology

## 2021-09-25 VITALS — BP 182/78 | HR 68 | Temp 98.3°F | Resp 18 | Ht 65.0 in | Wt 107.0 lb

## 2021-09-25 DIAGNOSIS — Z170 Estrogen receptor positive status [ER+]: Secondary | ICD-10-CM | POA: Diagnosis not present

## 2021-09-25 DIAGNOSIS — Z79899 Other long term (current) drug therapy: Secondary | ICD-10-CM | POA: Insufficient documentation

## 2021-09-25 DIAGNOSIS — N189 Chronic kidney disease, unspecified: Secondary | ICD-10-CM | POA: Insufficient documentation

## 2021-09-25 DIAGNOSIS — C50511 Malignant neoplasm of lower-outer quadrant of right female breast: Secondary | ICD-10-CM | POA: Insufficient documentation

## 2021-09-25 DIAGNOSIS — D631 Anemia in chronic kidney disease: Secondary | ICD-10-CM | POA: Insufficient documentation

## 2021-09-25 DIAGNOSIS — Z79811 Long term (current) use of aromatase inhibitors: Secondary | ICD-10-CM | POA: Insufficient documentation

## 2021-09-25 LAB — CBC WITH DIFFERENTIAL/PLATELET
Abs Immature Granulocytes: 0.44 10*3/uL — ABNORMAL HIGH (ref 0.00–0.07)
Basophils Absolute: 0 10*3/uL (ref 0.0–0.1)
Basophils Relative: 0 %
Eosinophils Absolute: 0 10*3/uL (ref 0.0–0.5)
Eosinophils Relative: 0 %
HCT: 30.5 % — ABNORMAL LOW (ref 36.0–46.0)
Hemoglobin: 9.7 g/dL — ABNORMAL LOW (ref 12.0–15.0)
Immature Granulocytes: 4 %
Lymphocytes Relative: 8 %
Lymphs Abs: 0.9 10*3/uL (ref 0.7–4.0)
MCH: 28.5 pg (ref 26.0–34.0)
MCHC: 31.8 g/dL (ref 30.0–36.0)
MCV: 89.7 fL (ref 80.0–100.0)
Monocytes Absolute: 0.2 10*3/uL (ref 0.1–1.0)
Monocytes Relative: 2 %
Neutro Abs: 9.9 10*3/uL — ABNORMAL HIGH (ref 1.7–7.7)
Neutrophils Relative %: 86 %
Platelets: 152 10*3/uL (ref 150–400)
RBC: 3.4 MIL/uL — ABNORMAL LOW (ref 3.87–5.11)
RDW: 16.9 % — ABNORMAL HIGH (ref 11.5–15.5)
WBC: 11.4 10*3/uL — ABNORMAL HIGH (ref 4.0–10.5)
nRBC: 0 % (ref 0.0–0.2)

## 2021-09-25 LAB — COMPREHENSIVE METABOLIC PANEL
ALT: 15 U/L (ref 0–44)
AST: 21 U/L (ref 15–41)
Albumin: 3.6 g/dL (ref 3.5–5.0)
Alkaline Phosphatase: 50 U/L (ref 38–126)
Anion gap: 8 (ref 5–15)
BUN: 45 mg/dL — ABNORMAL HIGH (ref 8–23)
CO2: 29 mmol/L (ref 22–32)
Calcium: 9.2 mg/dL (ref 8.9–10.3)
Chloride: 95 mmol/L — ABNORMAL LOW (ref 98–111)
Creatinine, Ser: 1.67 mg/dL — ABNORMAL HIGH (ref 0.44–1.00)
GFR, Estimated: 30 mL/min — ABNORMAL LOW (ref >60)
Glucose, Bld: 147 mg/dL — ABNORMAL HIGH (ref 70–99)
Potassium: 4 mmol/L (ref 3.5–5.1)
Sodium: 132 mmol/L — ABNORMAL LOW (ref 135–145)
Total Bilirubin: 0.5 mg/dL (ref 0.3–1.2)
Total Protein: 7.2 g/dL (ref 6.5–8.1)

## 2021-09-25 LAB — IRON AND TIBC
Iron: 75 ug/dL (ref 28–170)
Saturation Ratios: 29 % (ref 10.4–31.8)
TIBC: 256 ug/dL (ref 250–450)
UIBC: 181 ug/dL

## 2021-09-25 LAB — FERRITIN: Ferritin: 866 ng/mL — ABNORMAL HIGH (ref 11–307)

## 2021-09-25 NOTE — Patient Instructions (Addendum)
[name] at [facility] Discharge Instructions   You were seen and examined today by Dr. Delton Coombes.  He reviewed the results of your lab work with you which are normal/stable.    Thank you for choosing [place] at [facility] to provide your oncology and hematology care.  To afford each patient quality time with our provider, please arrive at least 15 minutes before your scheduled appointment time.   If you have a lab appointment with the [facility] please come in thru the Main Entrance and check in at the main information desk.  You need to re-schedule your appointment should you arrive 10 or more minutes late.  We strive to give you quality time with our providers, and arriving late affects you and other patients whose appointments are after yours.  Also, if you no show three or more times for appointments you may be dismissed from the clinic at the providers discretion.     Again, thank you for choosing [facility].  Our hope is that these requests will decrease the amount of time that you wait before being seen by our physicians.       _____________________________________________________________  Should you have questions after your visit to [facility], please contact our office at [phone] and follow the prompts.  Our office hours are 8:00 a.m. and 4:30 p.m. Monday - Friday.  Please note that voicemails left after 4:00 p.m. may not be returned until the following business day.  We are closed weekends and major holidays.  You do have access to a nurse 24-7, just call the main number to the clinic [phone] and do not press any options, hold on the line and a nurse will answer the phone.    For prescription refill requests, have your pharmacy contact our office and allow 72 hours.    Due to Covid, you will need to wear a mask upon entering the hospital. If you do not have a mask, a mask will be given  to you at the Main Entrance upon arrival. For doctor visits, patients may have 1 support person age 68 or older with them. For treatment visits, patients can not have anyone with them due to social distancing guidelines and our immunocompromised population.

## 2021-09-25 NOTE — Progress Notes (Signed)
[place] [address] [place] [address]   Patient Care Team: [name], DO as PCP - General (Family Medicine) Minus Breeding, MD as PCP - Cardiology (Cardiology) Rolm Bookbinder, MD as Consulting Physician (General Surgery) Eppie Gibson, MD as Attending Physician (Radiation Oncology) Truitt Merle, MD as Consulting Physician (Hematology) Lavonna Monarch, MD as Consulting Physician (Dermatology) Warren Danes, PA-C as Physician Assistant (Dermatology) Elmarie Shiley, MD as Consulting Physician (Nephrology) [facility]  SUMMARY OF ONCOLOGIC HISTORY: Oncology History Overview Note  Cancer Staging Malignant neoplasm of lower-outer quadrant of right breast of female, estrogen receptor positive ([place]) Staging form: Breast, AJCC 8th Edition - Clinical stage from 02/22/2018: Stage IA (cT1c, cN0, cM0, G1, ER+, PR+, HER2-) - Signed by Truitt Merle, MD on 03/02/2018    Malignant neoplasm of lower-outer quadrant of right breast of female, estrogen receptor positive ([place])  02/12/2018 Mammogram   She had routine screening bilateral mammography on 02/12/2018 at [facility] with results showing: indeterminate irregular mass in the right breast.    02/16/2018 Mammogram   She underwent right diagnostic mammography with tomography and right breast ultrasonography at [facility] on 02/16/2018 showing: 1.1 cm irregular mass at the 8 O'clock position on the right breast   02/22/2018 Pathology Results   Diagnosis 1. Breast, right, needle core biopsy, lateral, 8 o'clock - INVASIVE AND IN SITU DUCTAL CARCINOMA. - SEE COMMENT. 2. Breast, right, needle core biopsy, 11 o'clock - FIBROCYSTIC CHANGES. - USUAL DUCTAL HYPERPLASIA. - THERE IS NO EVIDENCE OF MALIGNANCY.   02/22/2018 Receptors her2   IMMUNOHISTOCHEMICAL AND MORPHOMETRIC ANALYSIS PERFORMED MANUALLY Estrogen Receptor: 100%, POSITIVE, STRONG STAINING INTENSITY Progesterone Receptor: 40%, POSITIVE, MODERATE STAINING  INTENSITY Proliferation Marker Ki67: 1% Her2 Negative   02/22/2018 Cancer Staging   Staging form: Breast, AJCC 8th Edition - Clinical stage from 02/22/2018: Stage IA (cT1c, cN0, cM0, G1, ER+, PR+, HER2-) - Signed by Truitt Merle, MD on 03/02/2018    02/25/2018 Initial Diagnosis   Malignant neoplasm of lower-outer quadrant of right breast of female, estrogen receptor positive ([place])     CHIEF COMPLIANT: Follow-up of right breast cancer and pancytopenia   INTERVAL HISTORY: Ms. CHANNELLE BOTTGER is a 88 y.o. female here today for follow up of her right breast cancer and pancytopenia. Her last visit was on 04/15/2021.   Today she reports feeling good. She continues to take iron tablets.   REVIEW OF SYSTEMS:   Review of Systems  Constitutional:  Negative for appetite change and fatigue.  All other systems reviewed and are negative.  I have reviewed the past medical history, past surgical history, social history and family history with the patient and they are unchanged from previous note.   ALLERGIES:   is allergic to amlodipine, xeroform occlusive gauze strip [bismuth tribromoph-petrolatum], isosorbide nitrate, sulfonamide derivatives, and terazosin.   MEDICATIONS:  Current Outpatient Medications  Medication Sig Dispense Refill   acetaminophen (TYLENOL) 325 MG tablet Take 325 mg by mouth daily as needed for moderate pain or headache.     atorvastatin (LIPITOR) 20 MG tablet TAKE 1/2 TABLET BY MOUTH EVERY DAY 45 tablet 1   bisoprolol (ZEBETA) 5 MG tablet Take 1 tablet (5 mg total) by mouth daily. 90 tablet 3   cholecalciferol (VITAMIN D) 1000 units tablet Take 1,000 Units by mouth daily.     diclofenac Sodium (VOLTAREN) 1 % GEL Apply 2 g topically 4 (four) times daily. 100 g 0   doxycycline (VIBRA-TABS) 100 MG tablet Take  1 tablet (100 mg total) by mouth 2 (two) times daily. 1 po bid 20 tablet 0   ferrous sulfate 325 (65 FE) MG tablet Take 325 mg by mouth 2 (two) times daily with a meal.      flecainide (TAMBOCOR) 50 MG tablet Take 1 tablet (50 mg total) by mouth 2 (two) times daily. 180 tablet 3   fluticasone (FLONASE) 50 MCG/ACT nasal spray PLACE 1 SPRAY INTO BOTH NOSTRILS 2 (TWO) TIMES DAILY 16 mL 5   hydrALAZINE (APRESOLINE) 100 MG tablet Take 100 mg by mouth 2 (two) times daily.     HYDROcodone bit-homatropine (HYCODAN) 5-1.5 MG/5ML syrup Take 5 mLs by mouth every 6 (six) hours as needed for cough. 120 mL 0   hydroxypropyl methylcellulose / hypromellose (ISOPTO TEARS / GONIOVISC) 2.5 % ophthalmic solution Place 1 drop into both eyes daily.     letrozole (FEMARA) 2.5 MG tablet TAKE 1 TABLET BY MOUTH EVERY DAY 90 tablet 4   levothyroxine (SYNTHROID) 75 MCG tablet Take 1 tablet (75 mcg total) by mouth daily. 90 tablet 3   losartan (COZAAR) 25 MG tablet Take 1 tablet (25 mg total) by mouth daily. 90 tablet 3   mirtazapine (REMERON) 7.5 MG tablet Take 1 tablet (7.5 mg total) by mouth at bedtime. 90 tablet 1   ondansetron (ZOFRAN ODT) 4 MG disintegrating tablet Take 1 tablet (4 mg total) by mouth every 8 (eight) hours as needed. 30 tablet 2   pantoprazole (PROTONIX) 40 MG tablet Take 1 tablet (40 mg total) by mouth 2 (two) times daily. 180 tablet 2   predniSONE (DELTASONE) 20 MG tablet Take 2 tablets (40 mg total) by mouth daily with breakfast for 5 days. 2 po daily for 5 days 10 tablet 0   Spacer/Aero-Holding Chambers (PROCARE SPACER/ADULT MASK) DEVI Use as directed 1 each 0   albuterol (PROVENTIL) (2.5 MG/3ML) 0.083% nebulizer solution Take 3 mLs (2.5 mg total) by nebulization every 4 (four) hours as needed for wheezing or shortness of breath. (Patient not taking: Reported on 09/25/2021) 75 mL 12   albuterol (VENTOLIN HFA) 108 (90 Base) MCG/ACT inhaler Inhale 2 puffs into the lungs every 6 (six) hours as needed for wheezing or shortness of breath. (Patient not taking: Reported on 09/25/2021) 8 g 2   No current facility-administered medications for this visit.     PHYSICAL  EXAMINATION: Performance status (ECOG): 1 - Symptomatic but completely ambulatory  Vitals:   09/25/21 1444  BP: (!) 182/78  Pulse: 68  Resp: 18  Temp: 98.3 F (36.8 C)  SpO2: 96%   Wt Readings from Last 3 Encounters:  09/25/21 107 lb (48.5 kg)  09/20/21 105 lb (47.6 kg)  09/11/21 103 lb (46.7 kg)   Physical Exam Vitals reviewed.  Constitutional:      Appearance: Normal appearance.  Cardiovascular:     Rate and Rhythm: Normal rate and regular rhythm.     Pulses: Normal pulses.     Heart sounds: Normal heart sounds.  Pulmonary:     Effort: Pulmonary effort is normal.     Breath sounds: Normal breath sounds.  Chest:  Breasts:    Right: Normal. No swelling, bleeding, inverted nipple, mass, nipple discharge, skin change (LOQ lumpectomy scar WNL) or tenderness.     Left: Normal. No swelling, bleeding, inverted nipple, mass, nipple discharge, skin change or tenderness.  Lymphadenopathy:     Upper Body:     Right upper body: No supraclavicular, axillary or pectoral adenopathy.  Left upper body: No supraclavicular, axillary or pectoral adenopathy.  Neurological:     General: No focal deficit present.     Mental Status: She is alert and oriented to person, place, and time.  Psychiatric:        Mood and Affect: Mood normal.        Behavior: Behavior normal.    Breast Exam Chaperone: Thana Ates     LABORATORY DATA:  I have reviewed the data as listed CMP Latest Ref Rng & Units 09/25/2021 09/02/2021 09/01/2021  Glucose 70 - 99 mg/dL 147(H) 106(H) 103(H)  BUN 8 - 23 mg/dL 45(H) 19 19  Creatinine 0.44 - 1.00 mg/dL 1.67(H) 1.72(H) 1.74(H)  Sodium 135 - 145 mmol/L 132(L) 132(L) 130(L)  Potassium 3.5 - 5.1 mmol/L 4.0 3.6 3.7  Chloride 98 - 111 mmol/L 95(L) 98 95(L)  CO2 22 - 32 mmol/L _0 Calcium 8.9 - 10.3 mg/dL 9.2 8.5(L) 8.3(L)  Total Protein 6.5 - 8.1 g/dL 7.2 - -  Total Bilirubin 0.3 - 1.2 mg/dL 0.5 - -  Alkaline Phos 38 - 126 U/L 50 - -  AST 15 - 41 U/L 21 - -   ALT 0 - 44 U/L 15 - -   No results found for: ACZ660 Lab Results  Component Value Date   WBC 11.4 (H) 09/25/2021   HGB 9.7 (L) 09/25/2021   HCT 30.5 (L) 09/25/2021   MCV 89.7 09/25/2021   PLT 152 09/25/2021   NEUTROABS 9.9 (H) 09/25/2021    ASSESSMENT:  1.  Stage I right breast cancer: - Right lumpectomy on 04/08/2018, IDC, 1.1 cm, grade 1, margins negative, ER/PR positive, HER2 negative, Ki-67 1%. - Letrozole started on 05/28/2018.  She declined adjuvant radiation therapy.   PLAN:  1.  Stage I right breast cancer: - Right lumpectomy scar in the lower outer quadrant is within normal limits.  No palpable masses in bilateral breast. - Continue letrozole.  She is tolerating it very well. - We will arrange her mammogram in August this year. - RTC 6 months for follow-up.  2.  Normocytic anemia: - Anemia from CKD and relative iron deficiency. - Hemoglobin is 9.7.  Ferritin is 866 and percent saturation is 29.  She is taking oral iron therapy.  No need for parenteral iron therapy. - We will plan to check again in 6 months.  Breast Cancer therapy associated bone loss: I have recommended calcium, Vitamin D and weight bearing exercises.  Orders placed this encounter:  No orders of the defined types were placed in this encounter.   The patient has a good understanding of the overall plan. She agrees with it. She will call with any problems that may develop before the next visit here.  Derek Jack, MD [place] [phone]   I, Thana Ates, am acting as a scribe for Dr. Derek Jack.  I, Derek Jack MD, have reviewed the above documentation for accuracy and completeness, and I agree with the above.

## 2021-09-30 ENCOUNTER — Encounter (INDEPENDENT_AMBULATORY_CARE_PROVIDER_SITE_OTHER): Payer: Self-pay | Admitting: Gastroenterology

## 2021-09-30 ENCOUNTER — Ambulatory Visit (INDEPENDENT_AMBULATORY_CARE_PROVIDER_SITE_OTHER): Payer: PPO | Admitting: Gastroenterology

## 2021-09-30 ENCOUNTER — Other Ambulatory Visit: Payer: Self-pay

## 2021-09-30 VITALS — BP 163/63 | HR 63 | Temp 98.2°F | Ht 65.0 in | Wt 105.5 lb

## 2021-09-30 DIAGNOSIS — D500 Iron deficiency anemia secondary to blood loss (chronic): Secondary | ICD-10-CM

## 2021-09-30 DIAGNOSIS — K269 Duodenal ulcer, unspecified as acute or chronic, without hemorrhage or perforation: Secondary | ICD-10-CM

## 2021-09-30 NOTE — Patient Instructions (Signed)
Please continue your protonix 40mg  twice a day Please let me know if you have any abdominal pain, rectal bleeding, diarrhea, constipation, SOB, dizziness or fatigue. Please continue to avoid NSAID medications.   We will plan to see you back in April.

## 2021-09-30 NOTE — Progress Notes (Signed)
Referring Provider: [name], DO Primary Care Physician:  [name], DO Primary GI Physician: Jenetta Downer  Chief Complaint  Patient presents with   Hospitalization Follow-up    Follow up hospitalization for GI bleed. Patient states she is feeling fine and has no new concerns.    HPI:   [name] is a 88 y.o. female with past medical history of asthma, a fib, BCC, CKD, HLD, HTN, hypothyroidism who presents today for follow up of constipation/nausea and vomiting.    Patient presenting today for hospital follow up. Admitted 09/01/21 for weakness, early satiety and weight loss. Hgb 7.4 on admission. Received 2 unit PRBCs. EGD on 09/01/21 with findings of esophageal plaques, gastritis, mucosa variant in duodenum, non bleeding duodenal ulcer with no stigmata of bleeding, mild schatzski ring. Recommendations for PPI BID x12 weeks. Biopsies neg for H pylori or candida.   Iron studies on 2/8 normal other than ferritin of 866. Last hgb was 9.7 on 2/8 with MCV 89.7. currently on oral iron therapy, followed by Heme/Onc for Breast cancer/normocytic anemia, last seen 09/25/21.  Today patient states that she is doing well. She has no rectal bleeding. She has dark stools but she is currently on iron pills po. She states that her appetite is good. She denies any recent weight loss. She eats mostly whatever she wants. She denies any issues with constipation or diarrhea. Has 1 BM per day. She denies any acid reflux or heartburn. She is maintained on protonix 40mg  BID. She denies fatigue, dizziness or shortness of breath.   Last Colonoscopy:(2016) diverticulosis, hemorrhoids  Last Endoscopy:09/01/21 Esophageal plaques were found, suspicious for candidiasis. Biopsied-neg for candidiasis, consistent with reflux disease - Gastritis. Biopsied-mild chronic gastritis, neg for H pylori - Mucosal variant in the duodenum. - Non-bleeding duodenal ulcer with no stigmata of bleeding. - Mild Schatzki  ring.   Past Medical History:  Diagnosis Date   Anemia    Arthritis    Asthma    Atrial fibrillation ([place])    Basal cell carcinoma 02/06/2009   Left ear targus- ([facility])   Basal cell carcinoma 09/28/2008   Right back-(CX35FU)   CKD (chronic kidney disease), stage III ([facility])    Heart murmur    Hyperlipidemia    Hypertension    Hypothyroidism    Nodule of right lung    Right upper lobe   PONV (postoperative nausea and vomiting)    Renal insufficiency    Chronic   Renal vascular disease    Right renal artery stenosis ([place]) 05/09/2015   Squamous cell carcinoma of skin 09/07/2019   KA-Left shin-txpbx   Vertigo     Past Surgical History:  Procedure Laterality Date   BIOPSY  04/09/2021   Procedure: BIOPSY;  Surgeon: Harvel Quale, MD;  [facility];  Service: Gastroenterology;;   BREAST LUMPECTOMY WITH RADIOACTIVE SEED LOCALIZATION Right 04/08/2018   Procedure: BREAST LUMPECTOMY WITH RADIOACTIVE SEED LOCALIZATION;  Surgeon: Rolm Bookbinder, MD;  Location: [facility];  Service: General;  Laterality: Right;   COLONOSCOPY  2016   diverticulosis, hemorrhoids   ESOPHAGOGASTRODUODENOSCOPY  2016   normal   ESOPHAGOGASTRODUODENOSCOPY (EGD) WITH PROPOFOL N/A 04/09/2021   Procedure: ESOPHAGOGASTRODUODENOSCOPY (EGD) WITH PROPOFOL;  Surgeon: Harvel Quale, MD;  [facility];  Service: Gastroenterology;  Laterality: N/A;  12:45   ESOPHAGOGASTRODUODENOSCOPY (EGD) WITH PROPOFOL N/A 09/01/2021   Procedure: ESOPHAGOGASTRODUODENOSCOPY (EGD) WITH PROPOFOL;  Surgeon: Eloise Harman, DO;  [facility];  Service: Endoscopy;  Laterality: N/A;   IR GENERIC HISTORICAL  08/29/2016   IR US GUIDE VASC ACCESS RIGHT 08/29/2016 [facility]   IR GENERIC HISTORICAL  08/29/2016   IR RENAL BILAT S&I MOD SED 08/29/2016 [facility]   KNEE ARTHROSCOPY Right    2019   THYROIDECTOMY, PARTIAL Right 1981   middle lobe removed 1st, then right lobe removed 7-8  years later (approx. 1988)    Current Outpatient Medications  Medication Sig Dispense Refill   acetaminophen (TYLENOL) 325 MG tablet Take 325 mg by mouth daily as needed for moderate pain or headache.     atorvastatin (LIPITOR) 20 MG tablet TAKE 1/2 TABLET BY MOUTH EVERY DAY 45 tablet 1   bisoprolol (ZEBETA) 5 MG tablet Take 1 tablet (5 mg total) by mouth daily. 90 tablet 3   cholecalciferol (VITAMIN D) 1000 units tablet Take 1,000 Units by mouth daily.     diclofenac Sodium (VOLTAREN) 1 % GEL Apply 2 g topically 4 (four) times daily. 100 g 0   ferrous sulfate 325 (65 FE) MG tablet Take 325 mg by mouth 2 (two) times daily with a meal.     flecainide (TAMBOCOR) 50 MG tablet Take 1 tablet (50 mg total) by mouth 2 (two) times daily. 180 tablet 3   fluticasone (FLONASE) 50 MCG/ACT nasal spray PLACE 1 SPRAY INTO BOTH NOSTRILS 2 (TWO) TIMES DAILY 16 mL 5   hydrALAZINE (APRESOLINE) 100 MG tablet Take 100 mg by mouth 2 (two) times daily.     hydroxypropyl methylcellulose / hypromellose (ISOPTO TEARS / GONIOVISC) 2.5 % ophthalmic solution Place 1 drop into both eyes daily.     levothyroxine (SYNTHROID) 75 MCG tablet Take 1 tablet (75 mcg total) by mouth daily. 90 tablet 3   losartan (COZAAR) 25 MG tablet Take 1 tablet (25 mg total) by mouth daily. 90 tablet 3   mirtazapine (REMERON) 7.5 MG tablet Take 1 tablet (7.5 mg total) by mouth at bedtime. 90 tablet 1   pantoprazole (PROTONIX) 40 MG tablet Take 1 tablet (40 mg total) by mouth 2 (two) times daily. 180 tablet 2   letrozole (FEMARA) 2.5 MG tablet TAKE 1 TABLET BY MOUTH EVERY DAY (Patient not taking: Reported on 09/30/2021) 90 tablet 4   No current facility-administered medications for this visit.    Allergies as of 09/30/2021 - Review Complete 09/30/2021  Allergen Reaction Noted   Amlodipine Other (See Comments) 10/24/2015   Lisinopril  09/30/2021   Xeroform occlusive gauze strip [bismuth tribromoph-petrolatum]  05/30/2021   Isosorbide nitrate  Nausea Only 06/19/2014   Sulfonamide derivatives Nausea Only and Rash 12/19/2009   Terazosin Hives and Nausea Only 06/10/2013    Family History  Problem Relation Age of Onset   Thyroid disease Mother    Stroke Father    Hypertension Brother    Lung cancer Maternal Uncle    Cancer Maternal Grandmother        Liver   Lung cancer Maternal Uncle    Hypertension Daughter    Hypercholesterolemia Daughter    Hypercholesterolemia Daughter     Social History   Socioeconomic History   Marital status: Widowed    Spouse name: Not on file   Number of children: 2   Years of education: 53   Highest education level: Not on file  Occupational History   Occupation: Retired  Tobacco Use   Smoking status: Never   Smokeless tobacco: Never  Vaping Use   Vaping Use: Never used  Substance and Sexual Activity  Alcohol use: No   Drug use: No   Sexual activity: Never  Other Topics Concern   Not on file  Social History Narrative   Lives alone, but her daughter stays there at night   Ambidextrous (writes with left hand).   No caffeine use.   Social Determinants of Health   Financial Resource Strain: Low Risk    Difficulty of Paying Living Expenses: Not hard at all  Food Insecurity: No Food Insecurity   Worried About Charity fundraiser in the Last Year: Never true   [place] in the Last Year: Never true  Transportation Needs: No Transportation Needs   Lack of Transportation (Medical): No   Lack of Transportation (Non-Medical): No  Physical Activity: Inactive   Days of Exercise per Week: 0 days   Minutes of Exercise per Session: 0 min  Stress: No Stress Concern Present   Feeling of Stress : Not at all  Social Connections: Moderately Isolated   Frequency of Communication with Friends and Family: More than three times a week   Frequency of Social Gatherings with Friends and Family: More than three times a week   Attends Religious Services: More than 4 times per year   Active  Member of [employer] or Organizations: No   Attends Archivist Meetings: Never   Marital Status: Widowed   Review of systems General: negative for malaise, night sweats, fever, chills, weight loss Neck: Negative for lumps, goiter, pain and significant neck swelling Resp: Negative for cough, wheezing, dyspnea at rest CV: Negative for chest pain, leg swelling, palpitations, orthopnea GI: denies melena, hematochezia, nausea, vomiting, diarrhea, constipation, dysphagia, odyonophagia, early satiety or unintentional weight loss.  MSK: Negative for joint pain or swelling, back pain, and muscle pain. Derm: Negative for itching or rash Psych: Denies depression, anxiety, memory loss, confusion. No homicidal or suicidal ideation.  Heme: Negative for prolonged bleeding, bruising easily, and swollen nodes. Endocrine: Negative for cold or heat intolerance, polyuria, polydipsia and goiter. Neuro: negative for tremor, gait imbalance, syncope and seizures. The remainder of the review of systems is noncontributory.  Physical Exam: BP (!) 163/63 (BP Location: Right Arm, Patient Position: Sitting, Cuff Size: Normal)    Pulse 63    Temp 98.2 F (36.8 C) (Oral)    Ht 5\' 5"  (1.651 m)    Wt 105 lb 8 oz (47.9 kg)    BMI 17.56 kg/m  General:   Alert and oriented. No distress noted. Pleasant and cooperative.  Head:  Normocephalic and atraumatic. Eyes:  Conjuctiva clear without scleral icterus. Mouth:  Oral mucosa pink and moist. Good dentition. No lesions. Heart: Normal rate and rhythm, s1 and s2 heart sounds present.  Lungs: Clear lung sounds in all lobes. Respirations equal and unlabored. Abdomen:  +BS, soft, non-tender and non-distended. No rebound or guarding. No HSM or masses noted. Derm: No palmar erythema or jaundice Msk:  Symmetrical without gross deformities. Normal posture. Extremities:  Without edema. Neurologic:  Alert and  oriented x4 Psych:  Alert and cooperative. Normal mood and  affect.  Invalid input(s): 6 MONTHS   ASSESSMENT: RASHIDAH BELLEVILLE is a 88 y.o. female presenting today for hospital follow up after presenting for weakness and early satiety, found to have hgb of 7.4, FOBT positive, requiring 2 units PRBC transfusion with EGD showing Esophageal plaques (bx with no candida) Gastritis (bx negative for h pylori),Non-bleeding duodenal ulcer with no stigmata of bleeding. Mild Schatzki ring.  Patient continues on pantoprazole 40mg   BID, she feels that she is doing well, overall. Maintained on oral iron,managed by Hematology for her ongoing normocytic anemia secondary to CKD with relative iron deficiency. She has dark stools but relates them to her oral iron. Has no overt GI bleeding. Denies any fatigue, sob, or dizziness. No constipation or diarrhea. Patient has routine labs with hematology and PCP. Will continue PPI BID at this time, consider decreasing to once daily at next visit if she still remains stable.   PLAN:  Continue PPI BID 2. Continue to avoid NSAIDs 3. Continue to follow with Heme/onc for anemia management 4. Consider reducing PPI to once daily after 3 months if anemia is stable, no further episodes of bleeding   Follow Up: 2 months  Janet Filosa L. Alver Sorrow, MSN, APRN, AGNP-C Adult-Gerontology Nurse Practitioner [facility]

## 2021-10-01 DIAGNOSIS — K269 Duodenal ulcer, unspecified as acute or chronic, without hemorrhage or perforation: Secondary | ICD-10-CM | POA: Insufficient documentation

## 2021-10-01 DIAGNOSIS — D500 Iron deficiency anemia secondary to blood loss (chronic): Secondary | ICD-10-CM | POA: Insufficient documentation

## 2021-10-01 HISTORY — DX: Duodenal ulcer, unspecified as acute or chronic, without hemorrhage or perforation: K26.9

## 2021-10-09 DIAGNOSIS — J9601 Acute respiratory failure with hypoxia: Secondary | ICD-10-CM | POA: Diagnosis not present

## 2021-10-14 DIAGNOSIS — I110 Hypertensive heart disease with heart failure: Secondary | ICD-10-CM | POA: Diagnosis not present

## 2021-10-14 DIAGNOSIS — I5032 Chronic diastolic (congestive) heart failure: Secondary | ICD-10-CM | POA: Diagnosis not present

## 2021-10-14 DIAGNOSIS — D696 Thrombocytopenia, unspecified: Secondary | ICD-10-CM | POA: Diagnosis not present

## 2021-10-14 DIAGNOSIS — Z681 Body mass index (BMI) 19 or less, adult: Secondary | ICD-10-CM | POA: Diagnosis not present

## 2021-10-14 DIAGNOSIS — D6869 Other thrombophilia: Secondary | ICD-10-CM | POA: Diagnosis not present

## 2021-10-14 DIAGNOSIS — E46 Unspecified protein-calorie malnutrition: Secondary | ICD-10-CM | POA: Diagnosis not present

## 2021-10-14 DIAGNOSIS — I4891 Unspecified atrial fibrillation: Secondary | ICD-10-CM | POA: Diagnosis not present

## 2021-10-18 ENCOUNTER — Ambulatory Visit: Payer: PPO | Admitting: Pulmonary Disease

## 2021-10-18 IMAGING — CT CT ABD-PELV W/O CM
2 of 4 series · 16 of 46 positions shown, 18 images · non-contrast
Comparison: 06/03/2020

CLINICAL DATA: Left lower quadrant pain

EXAM:
CT ABDOMEN AND PELVIS WITHOUT CONTRAST
TECHNIQUE: Multidetector CT imaging of the abdomen and pelvis was performed
following the standard protocol without IV contrast.

[Series 2: axial st · axial · 0.68mm/px · z∈[-735,-380]mm · 13 of 79 slices shown, 15 images]
[im 4/79  soft-tissue]
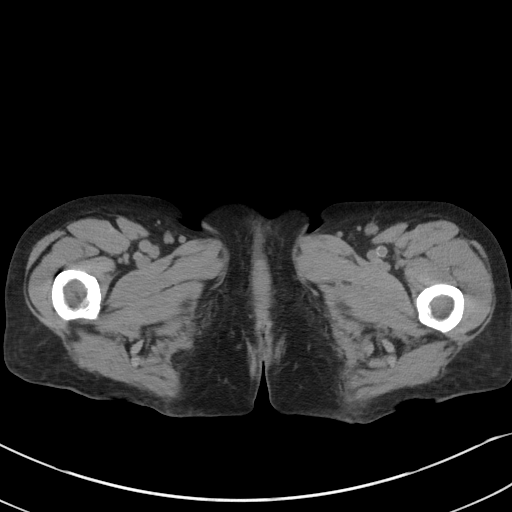
[im 4/79  bone]
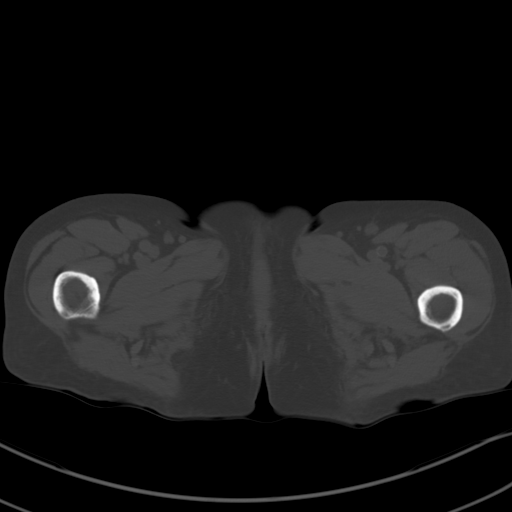
[im 12/79  soft-tissue]
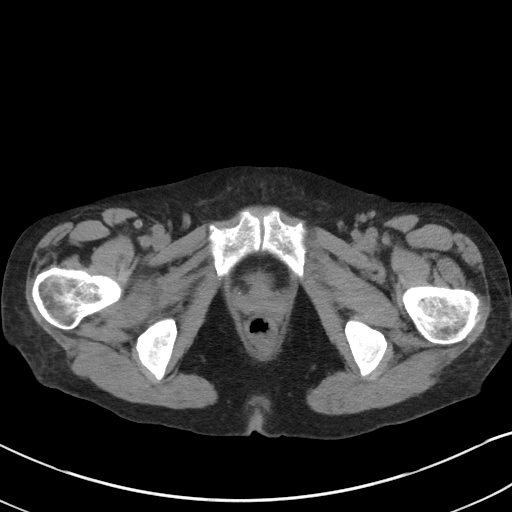
[im 16/79  soft-tissue]
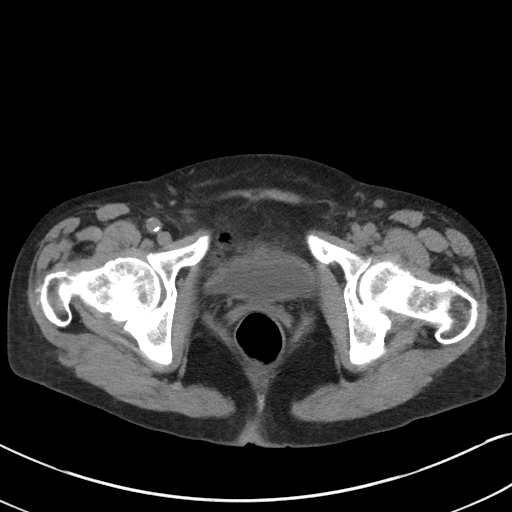
[im 24/79  soft-tissue]
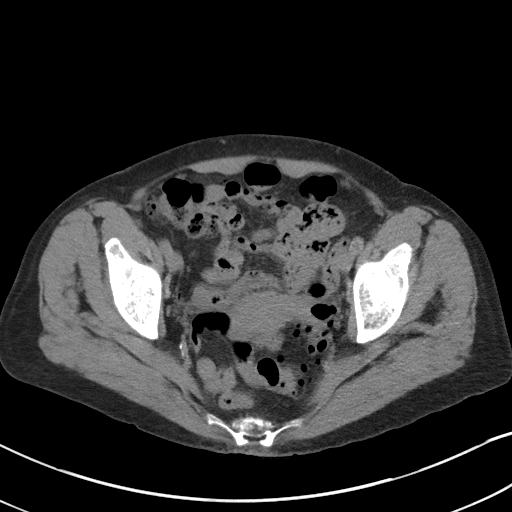
[im 28/79  soft-tissue]
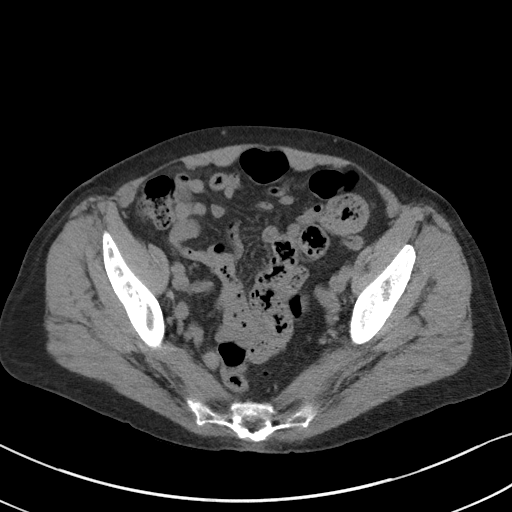
[im 36/79  soft-tissue]
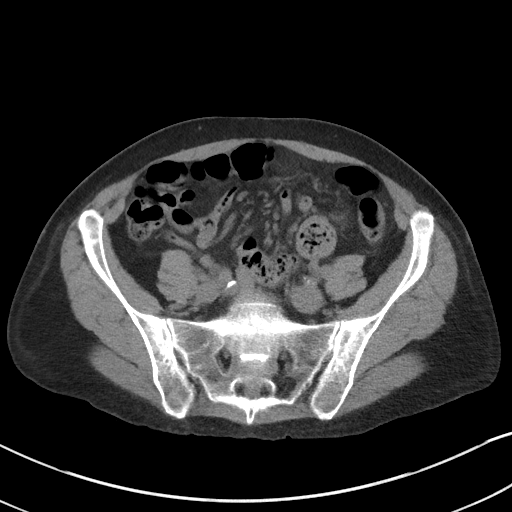
[im 40/79  soft-tissue]
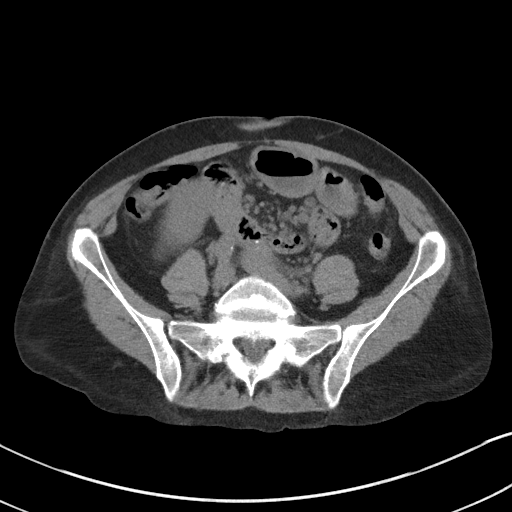
[im 43/79  soft-tissue]
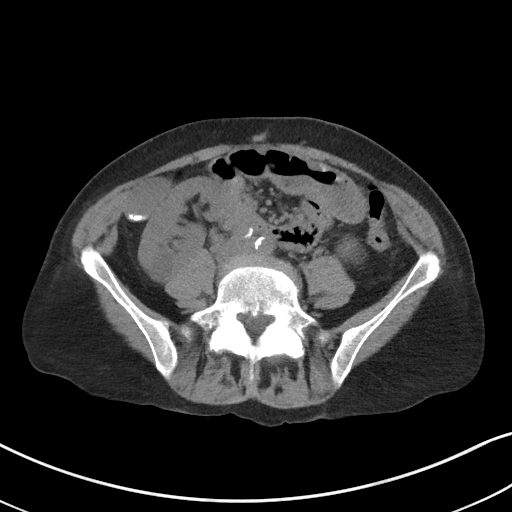
[im 51/79  soft-tissue]
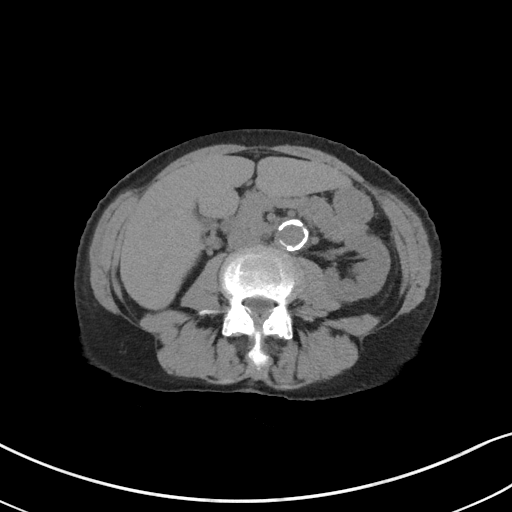
[im 51/79  bone]
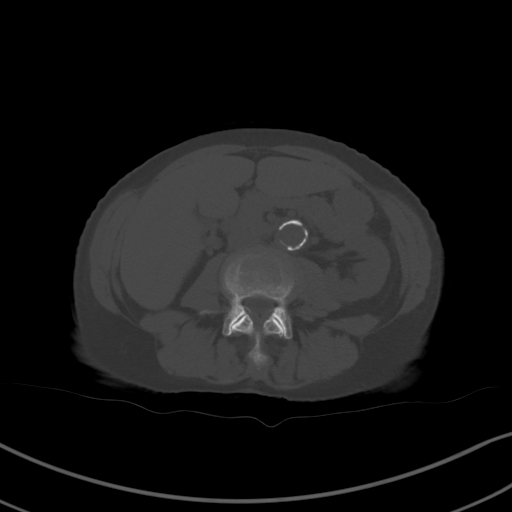
[im 55/79  soft-tissue]
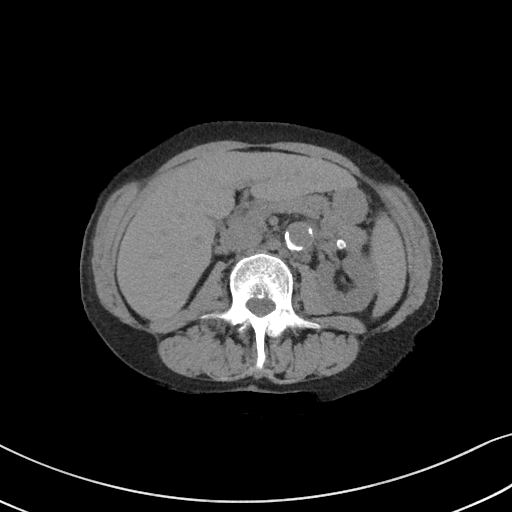
[im 63/79  soft-tissue]
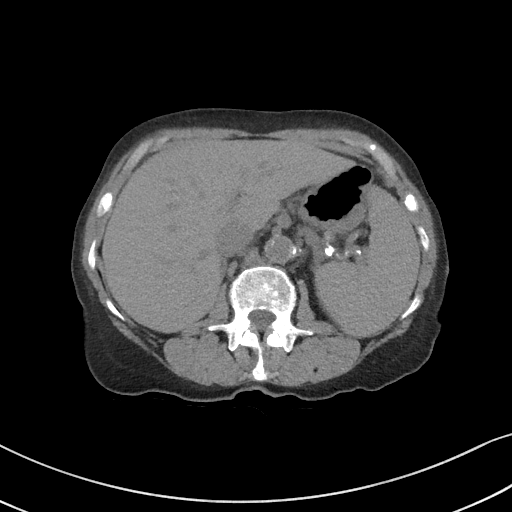
[im 67/79  soft-tissue]
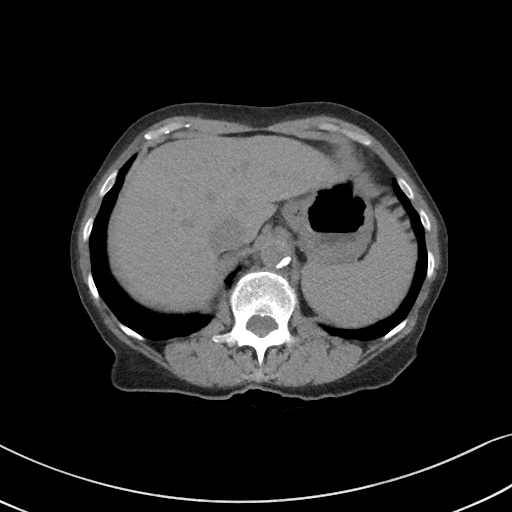
[im 75/79  soft-tissue]
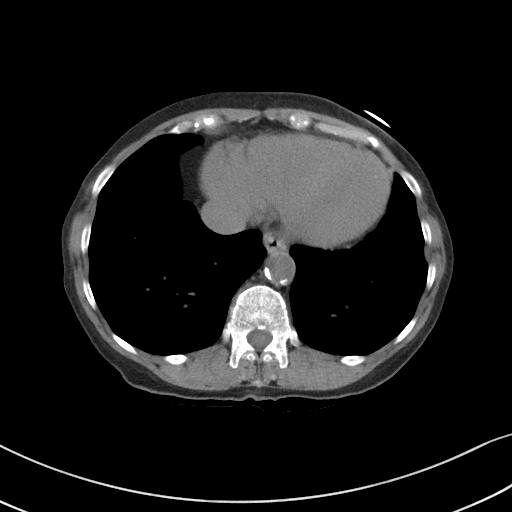

[Series 5: coronal st · coronal · 0.69mm/px · 3 of 85 slices shown]
[im 29/85  soft-tissue]
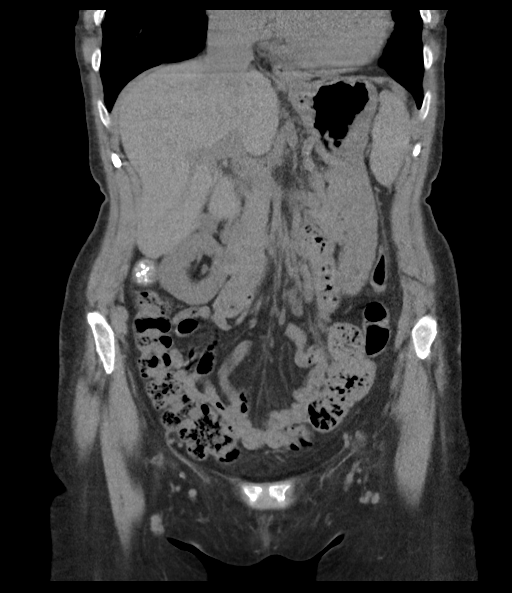
[im 38/85  soft-tissue]
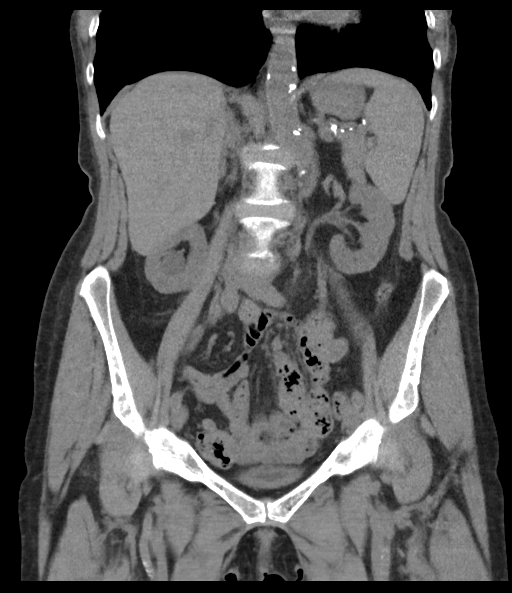
[im 47/85  soft-tissue]
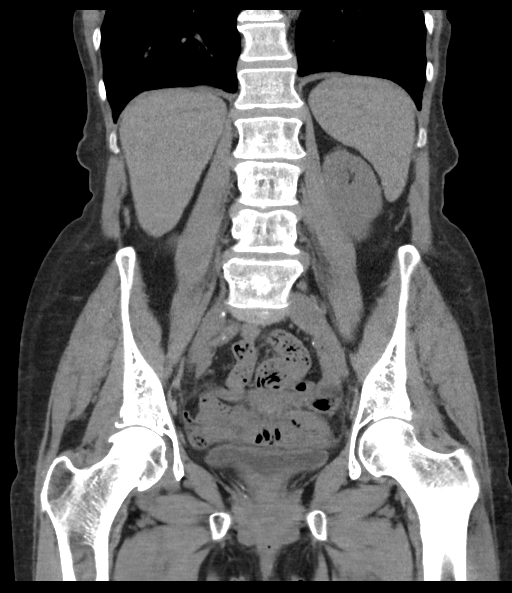

[16 of 46 positions shown; findings below may reference images not displayed]

FINDINGS: Lower chest: No acute abnormality.  Heart is enlarged in size.

Hepatobiliary: Cholelithiasis is noted without complicating factors.
The liver is within normal limits.

Pancreas: Unremarkable. No pancreatic ductal dilatation or
surrounding inflammatory changes.

Spleen: Normal in size without focal abnormality.

Adrenals/Urinary Tract: Adrenal glands are within normal limits.
Kidneys are well visualized bilaterally without renal calculi. Right
kidney is somewhat ptotic in nature but stable. No obstructive
changes are seen. The bladder is decompressed.

Stomach/Bowel: Appendix is within normal limits. No obstructive or
inflammatory changes of the colon are seen. Mild diverticular
changes noted without diverticulitis. Small bowel and stomach appear
within normal limits.

Vascular/Lymphatic: Aortic atherosclerosis. No enlarged abdominal or
pelvic lymph nodes.

Reproductive: Uterus and bilateral adnexa are unremarkable.

Other: No abdominal wall hernia or abnormality. No abdominopelvic
ascites.

Musculoskeletal: Degenerative changes of lumbar spine are noted.
IMPRESSION: Diverticulosis without diverticulitis stable from the prior exam.

Cholelithiasis without complicating factors.

Stable cardiomegaly.

No other focal abnormality is noted.

## 2021-10-28 ENCOUNTER — Other Ambulatory Visit (HOSPITAL_COMMUNITY): Payer: PPO

## 2021-11-04 ENCOUNTER — Ambulatory Visit (HOSPITAL_COMMUNITY): Payer: PPO | Admitting: Hematology

## 2021-11-04 ENCOUNTER — Telehealth: Payer: Self-pay | Admitting: Family Medicine

## 2021-11-04 NOTE — Telephone Encounter (Signed)
Aware to keep 4/25 - this will be 3 mos from last OV with Dr Darnell Level   ?

## 2021-11-05 ENCOUNTER — Ambulatory Visit: Payer: PPO | Admitting: Nurse Practitioner

## 2021-11-07 ENCOUNTER — Ambulatory Visit (INDEPENDENT_AMBULATORY_CARE_PROVIDER_SITE_OTHER): Payer: PPO | Admitting: Gastroenterology

## 2021-11-28 DIAGNOSIS — D692 Other nonthrombocytopenic purpura: Secondary | ICD-10-CM | POA: Diagnosis not present

## 2021-11-28 DIAGNOSIS — I10 Essential (primary) hypertension: Secondary | ICD-10-CM | POA: Diagnosis not present

## 2021-12-02 ENCOUNTER — Ambulatory Visit (INDEPENDENT_AMBULATORY_CARE_PROVIDER_SITE_OTHER): Payer: PPO | Admitting: Gastroenterology

## 2021-12-02 ENCOUNTER — Encounter (INDEPENDENT_AMBULATORY_CARE_PROVIDER_SITE_OTHER): Payer: Self-pay | Admitting: Gastroenterology

## 2021-12-02 VITALS — BP 188/69 | HR 63 | Temp 97.7°F | Ht 65.0 in | Wt 112.1 lb

## 2021-12-02 DIAGNOSIS — K269 Duodenal ulcer, unspecified as acute or chronic, without hemorrhage or perforation: Secondary | ICD-10-CM | POA: Diagnosis not present

## 2021-12-02 DIAGNOSIS — D500 Iron deficiency anemia secondary to blood loss (chronic): Secondary | ICD-10-CM | POA: Diagnosis not present

## 2021-12-02 MED ORDER — PANTOPRAZOLE SODIUM 40 MG PO TBEC: 40.0000 mg | DELAYED_RELEASE_TABLET | Freq: Every day | ORAL | 3 refills | Status: DC

## 2021-12-02 NOTE — Progress Notes (Signed)
? ?Referring Provider: [name], DO ?Primary Care Physician:  [name], DO ?Primary GI Physician: Jenetta Downer ? ?Chief Complaint  ?Patient presents with  ? Ulcer  ?  Follow up on duodenal ulcer and anemia. Will run out of pantoprazole today and needs refill if she is to continue me. Not having any concerns or problems today.   ? ?HPI:   ?[name] is a 88 y.o. female with past medical history of asthma, a fib, BCC, CKD, HLD, HTN, hypothyroidism. ? ?Patient presenting today for follow up of duodenal ulcer/anemia. ? ?Last seen Sep 30 2021 for hospital follow up of weakness, early satiety and weight loss, EGD during that time with findings below. Iron studies on 2/8 normal other than ferritin of 866. Last hgb was 9.7 on 2/8 with MCV 89.7. currently on oral iron therapy, followed by Heme/Onc for Breast cancer/normocytic anemia, last seen 09/25/21. At last visit, patient doing well, continued on iron pills and protonix '40mg'$  BID.  ? ?Today, she states that she is doing well overall. She states that she is on her last pill of protonix today. She reports appetite is good, she has gained a few pounds. Is seeing Dr. [name] again in August for repeat labs with iron studies and CBC, and will have labs done with PCP later this month. She has no GI concerns at this time. Weight is up 7 lbs since February. She denies fatigue, sob or dizziness. No red flag symptoms. Patient denies melena, hematochezia, nausea, vomiting, diarrhea, constipation, dysphagia, odyonophagia, early satiety or weight loss.  ? ? ?Last Colonoscopy:(2016) diverticulosis, hemorrhoids ?Last Endoscopy:09/01/21 Esophageal plaques were found, suspicious for candidiasis. Biopsied-neg for candidiasis, consistent with reflux disease ?- Gastritis. Biopsied-mild chronic gastritis, neg for H pylori ?- Mucosal variant in the duodenum. ?- Non-bleeding duodenal ulcer with no stigmata of bleeding. ?- Mild Schatzki ring. ? ?Recommendations:  ? ? ?Past  Medical History:  ?Diagnosis Date  ? Anemia   ? Arthritis   ? Asthma   ? Atrial fibrillation ([place])   ? Basal cell carcinoma 02/06/2009  ? Left ear targus- ([facility])  ? Basal cell carcinoma 09/28/2008  ? Right back-(CX35FU)  ? CKD (chronic kidney disease), stage III ([place])   ? Heart murmur   ? Hyperlipidemia   ? Hypertension   ? Hypothyroidism   ? Nodule of right lung   ? Right upper lobe  ? PONV (postoperative nausea and vomiting)   ? Renal insufficiency   ? Chronic  ? Renal vascular disease   ? Right renal artery stenosis ([place]) 05/09/2015  ? Squamous cell carcinoma of skin 09/07/2019  ? KA-Left shin-txpbx  ? Vertigo   ? ? ?Past Surgical History:  ?Procedure Laterality Date  ? BIOPSY  04/09/2021  ? Procedure: BIOPSY;  Surgeon: Montez Morita, Quillian Quince, MD;  [facility];  Service: Gastroenterology;;  ? BREAST LUMPECTOMY WITH RADIOACTIVE SEED LOCALIZATION Right 04/08/2018  ? Procedure: BREAST LUMPECTOMY WITH RADIOACTIVE SEED LOCALIZATION;  Surgeon: Rolm Bookbinder, MD;  Location: [facility];  Service: General;  Laterality: Right;  ? COLONOSCOPY  2016  ? diverticulosis, hemorrhoids  ? ESOPHAGOGASTRODUODENOSCOPY  2016  ? normal  ? ESOPHAGOGASTRODUODENOSCOPY (EGD) WITH PROPOFOL N/A 04/09/2021  ? Procedure: ESOPHAGOGASTRODUODENOSCOPY (EGD) WITH PROPOFOL;  Surgeon: Harvel Quale, MD;  [facility];  Service: Gastroenterology;  Laterality: N/A;  12:45  ? ESOPHAGOGASTRODUODENOSCOPY (EGD) WITH PROPOFOL N/A 09/01/2021  ? Procedure: ESOPHAGOGASTRODUODENOSCOPY (EGD) WITH PROPOFOL;  Surgeon: Eloise Harman, DO;  [facility];  Service: Endoscopy;  Laterality: N/A;  ? IR GENERIC HISTORICAL  08/29/2016  ? IR US GUIDE VASC ACCESS RIGHT 08/29/2016 [facility]  ? IR GENERIC HISTORICAL  08/29/2016  ? IR RENAL BILAT S&I MOD SED 08/29/2016 [facility]  ? KNEE ARTHROSCOPY Right   ? 2019  ? THYROIDECTOMY, PARTIAL Right 1981  ? middle lobe removed 1st, then right lobe removed 7-8 years later  (approx. 1988)  ? ? ?Current Outpatient Medications  ?Medication Sig Dispense Refill  ? acetaminophen (TYLENOL) 325 MG tablet Take 325 mg by mouth daily as needed for moderate pain or headache.    ? atorvastatin (LIPITOR) 20 MG tablet TAKE 1/2 TABLET BY MOUTH EVERY DAY 45 tablet 1  ? bisoprolol (ZEBETA) 5 MG tablet Take 1 tablet (5 mg total) by mouth daily. 90 tablet 3  ? cholecalciferol (VITAMIN D) 1000 units tablet Take 1,000 Units by mouth daily.    ? diclofenac Sodium (VOLTAREN) 1 % GEL Apply 2 g topically 4 (four) times daily. 100 g 0  ? ferrous sulfate 325 (65 FE) MG tablet Take 325 mg by mouth 2 (two) times daily with a meal.    ? flecainide (TAMBOCOR) 50 MG tablet Take 1 tablet (50 mg total) by mouth 2 (two) times daily. 180 tablet 3  ? fluticasone (FLONASE) 50 MCG/ACT nasal spray PLACE 1 SPRAY INTO BOTH NOSTRILS 2 (TWO) TIMES DAILY 16 mL 5  ? hydrALAZINE (APRESOLINE) 100 MG tablet Take 100 mg by mouth 2 (two) times daily.    ? hydroxypropyl methylcellulose / hypromellose (ISOPTO TEARS / GONIOVISC) 2.5 % ophthalmic solution Place 1 drop into both eyes daily.    ? letrozole (FEMARA) 2.5 MG tablet TAKE 1 TABLET BY MOUTH EVERY DAY 90 tablet 4  ? levothyroxine (SYNTHROID) 75 MCG tablet Take 1 tablet (75 mcg total) by mouth daily. 90 tablet 3  ? losartan (COZAAR) 25 MG tablet Take 1 tablet (25 mg total) by mouth daily. 90 tablet 3  ? pantoprazole (PROTONIX) 40 MG tablet Take 1 tablet (40 mg total) by mouth 2 (two) times daily. 180 tablet 2  ? mirtazapine (REMERON) 7.5 MG tablet Take 1 tablet (7.5 mg total) by mouth at bedtime. (Patient not taking: Reported on 12/02/2021) 90 tablet 1  ? ?No current facility-administered medications for this visit.  ? ? ?Allergies as of 12/02/2021 - Review Complete 12/02/2021  ?Allergen Reaction Noted  ? Amlodipine Other (See Comments) 10/24/2015  ? Lisinopril  09/30/2021  ? Xeroform occlusive gauze strip [bismuth tribromoph-petrolatum]  05/30/2021  ? Isosorbide nitrate Nausea Only  06/19/2014  ? Sulfonamide derivatives Nausea Only and Rash 12/19/2009  ? Terazosin Hives and Nausea Only 06/10/2013  ? ? ?Family History  ?Problem Relation Age of Onset  ? Thyroid disease Mother   ? Stroke Father   ? Hypertension Brother   ? Lung cancer Maternal Uncle   ? Cancer Maternal Grandmother   ?     Liver  ? Lung cancer Maternal Uncle   ? Hypertension Daughter   ? Hypercholesterolemia Daughter   ? Hypercholesterolemia Daughter   ? ? ?Social History  ? ?Socioeconomic History  ? Marital status: Widowed  ?  Spouse name: Not on file  ? Number of children: 2  ? Years of education: 22  ? Highest education level: Not on file  ?Occupational History  ? Occupation: Retired  ?Tobacco Use  ? Smoking status: Never  ? Smokeless tobacco: Never  ?Vaping Use  ? Vaping Use: Never used  ?  Substance and Sexual Activity  ? Alcohol use: No  ? Drug use: No  ? Sexual activity: Never  ?Other Topics Concern  ? Not on file  ?Social History Narrative  ? Lives alone, but her daughter stays there at night  ? Ambidextrous (writes with left hand).  ? No caffeine use.  ? ?Social Determinants of Health  ? ?Financial Resource Strain: Low Risk   ? Difficulty of Paying Living Expenses: Not hard at all  ?Food Insecurity: No Food Insecurity  ? Worried About Charity fundraiser in the Last Year: Never true  ? Ran Out of Food in the Last Year: Never true  ?Transportation Needs: No Transportation Needs  ? Lack of Transportation (Medical): No  ? Lack of Transportation (Non-Medical): No  ?Physical Activity: Inactive  ? Days of Exercise per Week: 0 days  ? Minutes of Exercise per Session: 0 min  ?Stress: No Stress Concern Present  ? Feeling of Stress : Not at all  ?Social Connections: Moderately Isolated  ? Frequency of Communication with Friends and Family: More than three times a week  ? Frequency of Social Gatherings with Friends and Family: More than three times a week  ? Attends Religious Services: More than 4 times per year  ? Active Member of  Clubs or Organizations: No  ? Attends Archivist Meetings: Never  ? Marital Status: Widowed  ? ?Review of systems ?General: negative for malaise, night sweats, fever, chills, weight loss ?Neck: Negative for

## 2021-12-02 NOTE — Patient Instructions (Signed)
Please continue your protonix '40mg'$  once daily ?Let me know if you have any rectal bleeding, black stools, weight loss, abdominal pain, dizziness, shortness of breath or fatigue ?Otherwise, I will plan to see you again in about 1 year ?If you need me before then, please don't hesitate to call ? ?

## 2021-12-03 ENCOUNTER — Other Ambulatory Visit: Payer: Self-pay

## 2021-12-03 ENCOUNTER — Other Ambulatory Visit: Payer: PPO

## 2021-12-03 DIAGNOSIS — N1832 Chronic kidney disease, stage 3b: Secondary | ICD-10-CM | POA: Diagnosis not present

## 2021-12-03 DIAGNOSIS — C50511 Malignant neoplasm of lower-outer quadrant of right female breast: Secondary | ICD-10-CM | POA: Diagnosis not present

## 2021-12-03 DIAGNOSIS — E034 Atrophy of thyroid (acquired): Secondary | ICD-10-CM

## 2021-12-03 DIAGNOSIS — Z170 Estrogen receptor positive status [ER+]: Secondary | ICD-10-CM

## 2021-12-04 ENCOUNTER — Other Ambulatory Visit (HOSPITAL_COMMUNITY): Payer: Self-pay | Admitting: *Deleted

## 2021-12-04 ENCOUNTER — Other Ambulatory Visit: Payer: PPO

## 2021-12-04 DIAGNOSIS — D696 Thrombocytopenia, unspecified: Secondary | ICD-10-CM

## 2021-12-04 DIAGNOSIS — D649 Anemia, unspecified: Secondary | ICD-10-CM

## 2021-12-04 DIAGNOSIS — D500 Iron deficiency anemia secondary to blood loss (chronic): Secondary | ICD-10-CM

## 2021-12-04 LAB — RENAL FUNCTION PANEL
Albumin: 4.3 g/dL (ref 3.6–4.6)
BUN/Creatinine Ratio: 15 (ref 12–28)
BUN: 24 mg/dL (ref 8–27)
CO2: 27 mmol/L (ref 20–29)
Calcium: 9.4 mg/dL (ref 8.7–10.3)
Chloride: 99 mmol/L (ref 96–106)
Creatinine, Ser: 1.64 mg/dL — ABNORMAL HIGH (ref 0.57–1.00)
Glucose: 103 mg/dL — ABNORMAL HIGH (ref 70–99)
Phosphorus: 3.9 mg/dL (ref 3.0–4.3)
Potassium: 4.2 mmol/L (ref 3.5–5.2)
Sodium: 141 mmol/L (ref 134–144)
eGFR: 30 mL/min/{1.73_m2} — ABNORMAL LOW (ref >59)

## 2021-12-04 LAB — CBC WITH DIFFERENTIAL/PLATELET
Basophils Absolute: 0 10*3/uL (ref 0.0–0.2)
Basos: 1 %
EOS (ABSOLUTE): 0 10*3/uL (ref 0.0–0.4)
Eos: 2 %
Hematocrit: 30.5 % — ABNORMAL LOW (ref 34.0–46.6)
Hemoglobin: 10.1 g/dL — ABNORMAL LOW (ref 11.1–15.9)
Immature Grans (Abs): 0 10*3/uL (ref 0.0–0.1)
Immature Granulocytes: 1 %
Lymphocytes Absolute: 0.7 10*3/uL (ref 0.7–3.1)
Lymphs: 35 %
MCH: 29.2 pg (ref 26.6–33.0)
MCHC: 33.1 g/dL (ref 31.5–35.7)
MCV: 88 fL (ref 79–97)
Monocytes Absolute: 0.2 10*3/uL (ref 0.1–0.9)
Monocytes: 10 %
Neutrophils Absolute: 1 10*3/uL — ABNORMAL LOW (ref 1.4–7.0)
Neutrophils: 51 %
Platelets: 90 10*3/uL — CL (ref 150–450)
RBC: 3.46 x10E6/uL — ABNORMAL LOW (ref 3.77–5.28)
RDW: 13.5 % (ref 11.7–15.4)
WBC: 1.9 10*3/uL — CL (ref 3.4–10.8)

## 2021-12-04 LAB — CMP14+EGFR
ALT: 12 IU/L (ref 0–32)
AST: 20 IU/L (ref 0–40)
Albumin/Globulin Ratio: 1.8 (ref 1.2–2.2)
Albumin: 4.3 g/dL (ref 3.6–4.6)
Alkaline Phosphatase: 70 IU/L (ref 44–121)
BUN/Creatinine Ratio: 15 (ref 12–28)
BUN: 24 mg/dL (ref 8–27)
Bilirubin Total: 0.4 mg/dL (ref 0.0–1.2)
CO2: 27 mmol/L (ref 20–29)
Calcium: 9.4 mg/dL (ref 8.7–10.3)
Chloride: 99 mmol/L (ref 96–106)
Creatinine, Ser: 1.59 mg/dL — ABNORMAL HIGH (ref 0.57–1.00)
Globulin, Total: 2.4 g/dL (ref 1.5–4.5)
Glucose: 105 mg/dL — ABNORMAL HIGH (ref 70–99)
Potassium: 4.3 mmol/L (ref 3.5–5.2)
Sodium: 141 mmol/L (ref 134–144)
Total Protein: 6.7 g/dL (ref 6.0–8.5)
eGFR: 32 mL/min/{1.73_m2} — ABNORMAL LOW (ref >59)

## 2021-12-04 LAB — TSH: TSH: 3.62 u[IU]/mL (ref 0.450–4.500)

## 2021-12-04 LAB — VITAMIN D 25 HYDROXY (VIT D DEFICIENCY, FRACTURES): Vit D, 25-Hydroxy: 74.9 ng/mL (ref 30.0–100.0)

## 2021-12-04 LAB — RETICULOCYTES: Retic Ct Pct: 1.6 % (ref 0.6–2.6)

## 2021-12-04 LAB — T4, FREE: Free T4: 1.75 ng/dL (ref 0.82–1.77)

## 2021-12-04 NOTE — Progress Notes (Addendum)
Patient inadvertently had labs drawn early for Dr. Delton Coombes and resulted an abnormal CBCD.  Per patient she has been very fatigued and feels her iron is low as well.  Orders placed per Dr. Tomie China request and will see Tarri Abernethy on 12/24/21 with a repeat CBC that day. ?

## 2021-12-05 ENCOUNTER — Ambulatory Visit (HOSPITAL_COMMUNITY): Payer: PPO | Admitting: Physician Assistant

## 2021-12-05 LAB — IRON AND TIBC
Iron Saturation: 20 % (ref 15–55)
Iron: 45 ug/dL (ref 27–139)
Total Iron Binding Capacity: 222 ug/dL — ABNORMAL LOW (ref 250–450)
UIBC: 177 ug/dL (ref 118–369)

## 2021-12-05 LAB — LACTATE DEHYDROGENASE: LDH: 155 IU/L (ref 119–226)

## 2021-12-05 LAB — VITAMIN B12: Vitamin B-12: 822 pg/mL (ref 232–1245)

## 2021-12-05 LAB — FERRITIN: Ferritin: 664 ng/mL — ABNORMAL HIGH (ref 15–150)

## 2021-12-05 LAB — FOLATE: Folate: >20 ng/mL (ref >3.0)

## 2021-12-05 NOTE — Progress Notes (Signed)
Patient called with results and advised that iron is not needed at this time.  Upcoming appointments reviewed with her.

## 2021-12-06 LAB — COPPER, SERUM: Copper: 99 ug/dL (ref 80–158)

## 2021-12-08 LAB — METHYLMALONIC ACID, SERUM: Methylmalonic Acid: 605 nmol/L — ABNORMAL HIGH (ref 0–378)

## 2021-12-10 ENCOUNTER — Encounter: Payer: Self-pay | Admitting: Family Medicine

## 2021-12-10 ENCOUNTER — Ambulatory Visit (HOSPITAL_COMMUNITY): Payer: PPO | Admitting: Physician Assistant

## 2021-12-10 ENCOUNTER — Ambulatory Visit (INDEPENDENT_AMBULATORY_CARE_PROVIDER_SITE_OTHER): Payer: PPO | Admitting: Family Medicine

## 2021-12-10 VITALS — BP 128/56 | HR 61 | Temp 97.1°F | Ht 65.0 in | Wt 113.6 lb

## 2021-12-10 DIAGNOSIS — E034 Atrophy of thyroid (acquired): Secondary | ICD-10-CM | POA: Diagnosis not present

## 2021-12-10 DIAGNOSIS — N1832 Chronic kidney disease, stage 3b: Secondary | ICD-10-CM

## 2021-12-10 DIAGNOSIS — I480 Paroxysmal atrial fibrillation: Secondary | ICD-10-CM

## 2021-12-10 DIAGNOSIS — D500 Iron deficiency anemia secondary to blood loss (chronic): Secondary | ICD-10-CM

## 2021-12-10 NOTE — Progress Notes (Signed)
? ?Subjective: ?CC:CKD/ Anemia ?PCP: Janet Norlander, DO ?WIO:XBDZH [name] is a 88 y.o. female presenting to clinic today for: ? ?1.  Anemia associated with chronic blood loss, CKD 3b ?She is compliant with her iron twice daily, Tambocor for A-fib, vitamin D and PPI.  She does not visualize any gross blood in stool but notes that they are dark due to iron use.  She admits to some fatigue but things are getting better overall.  She has subsequently gained some weight and is trying to get back into the gym.  [facility] at the [facility].  She has seen both her GI specialist and her hematologist since her last visit.  She has an appointment with her renal doctor in a few weeks.  Would like to get a copy of her labs so that she can bring that to this visit ? ?2.  Hypothyroidism ?Patient is compliant with Synthroid.  No reports of tremor, change in voice.  She did have an episode of A-fib but denies any elevation in her heart rate.  She simply felt some fluttering in her chest.  That is since resolved. ? ? ?ROS: Per HPI ? ?Allergies  ?Allergen Reactions  ? Amlodipine Other (See Comments)  ?  edema  ? Lisinopril   ?  cough  ? Xeroform Occlusive Gauze Strip [Bismuth Tribromoph-Petrolatum]   ? Isosorbide Nitrate Nausea Only  ? Sulfonamide Derivatives Nausea Only and Rash  ? Terazosin Hives and Nausea Only  ? ?Past Medical History:  ?Diagnosis Date  ? Anemia   ? Arthritis   ? Asthma   ? Atrial fibrillation ([place])   ? Basal cell carcinoma 02/06/2009  ? Left ear targus- ([facility])  ? Basal cell carcinoma 09/28/2008  ? Right back-(CX35FU)  ? CKD (chronic kidney disease), stage III ([place])   ? Heart murmur   ? Hyperlipidemia   ? Hypertension   ? Hypothyroidism   ? Nodule of right lung   ? Right upper lobe  ? PONV (postoperative nausea and vomiting)   ? Renal insufficiency   ? Chronic  ? Renal vascular disease   ? Right renal artery stenosis ([place]) 05/09/2015  ? Squamous cell carcinoma of skin 09/07/2019  ? KA-Left shin-txpbx  ?  Vertigo   ? ? ?Current Outpatient Medications:  ?  acetaminophen (TYLENOL) 325 MG tablet, Take 325 mg by mouth daily as needed for moderate pain or headache., Disp: , Rfl:  ?  atorvastatin (LIPITOR) 20 MG tablet, TAKE 1/2 TABLET BY MOUTH EVERY DAY, Disp: 45 tablet, Rfl: 1 ?  bisoprolol (ZEBETA) 5 MG tablet, Take 1 tablet (5 mg total) by mouth daily., Disp: 90 tablet, Rfl: 3 ?  cholecalciferol (VITAMIN D) 1000 units tablet, Take 1,000 Units by mouth daily., Disp: , Rfl:  ?  diclofenac Sodium (VOLTAREN) 1 % GEL, Apply 2 g topically 4 (four) times daily., Disp: 100 g, Rfl: 0 ?  ferrous sulfate 325 (65 FE) MG tablet, Take 325 mg by mouth 2 (two) times daily with a meal., Disp: , Rfl:  ?  flecainide (TAMBOCOR) 50 MG tablet, Take 1 tablet (50 mg total) by mouth 2 (two) times daily., Disp: 180 tablet, Rfl: 3 ?  fluticasone (FLONASE) 50 MCG/ACT nasal spray, PLACE 1 SPRAY INTO BOTH NOSTRILS 2 (TWO) TIMES DAILY, Disp: 16 mL, Rfl: 5 ?  hydrALAZINE (APRESOLINE) 100 MG tablet, Take 100 mg by mouth 2 (two) times daily., Disp: , Rfl:  ?  hydroxypropyl methylcellulose / hypromellose (ISOPTO TEARS /  GONIOVISC) 2.5 % ophthalmic solution, Place 1 drop into both eyes daily., Disp: , Rfl:  ?  letrozole (FEMARA) 2.5 MG tablet, TAKE 1 TABLET BY MOUTH EVERY DAY, Disp: 90 tablet, Rfl: 4 ?  levothyroxine (SYNTHROID) 75 MCG tablet, Take 1 tablet (75 mcg total) by mouth daily., Disp: 90 tablet, Rfl: 3 ?  pantoprazole (PROTONIX) 40 MG tablet, Take 1 tablet (40 mg total) by mouth daily., Disp: 90 tablet, Rfl: 3 ?  losartan (COZAAR) 25 MG tablet, Take 1 tablet (25 mg total) by mouth daily., Disp: 90 tablet, Rfl: 3 ?  mirtazapine (REMERON) 7.5 MG tablet, Take 1 tablet (7.5 mg total) by mouth at bedtime. (Patient not taking: Reported on 12/02/2021), Disp: 90 tablet, Rfl: 1 ?Social History  ? ?Socioeconomic History  ? Marital status: Widowed  ?  Spouse name: Not on file  ? Number of children: 2  ? Years of education: 35  ? Highest education level:  Not on file  ?Occupational History  ? Occupation: Retired  ?Tobacco Use  ? Smoking status: Never  ? Smokeless tobacco: Never  ?Vaping Use  ? Vaping Use: Never used  ?Substance and Sexual Activity  ? Alcohol use: No  ? Drug use: No  ? Sexual activity: Never  ?Other Topics Concern  ? Not on file  ?Social History Narrative  ? Lives alone, but her daughter stays there at night  ? Ambidextrous (writes with left hand).  ? No caffeine use.  ? ?Social Determinants of Health  ? ?Financial Resource Strain: Low Risk   ? Difficulty of Paying Living Expenses: Not hard at all  ?Food Insecurity: No Food Insecurity  ? Worried About Charity fundraiser in the Last Year: Never true  ? Ran Out of Food in the Last Year: Never true  ?Transportation Needs: No Transportation Needs  ? Lack of Transportation (Medical): No  ? Lack of Transportation (Non-Medical): No  ?Physical Activity: Inactive  ? Days of Exercise per Week: 0 days  ? Minutes of Exercise per Session: 0 min  ?Stress: No Stress Concern Present  ? Feeling of Stress : Not at all  ?Social Connections: Moderately Isolated  ? Frequency of Communication with Friends and Family: More than three times a week  ? Frequency of Social Gatherings with Friends and Family: More than three times a week  ? Attends Religious Services: More than 4 times per year  ? Active Member of Clubs or Organizations: No  ? Attends Archivist Meetings: Never  ? Marital Status: Widowed  ?Intimate Partner Violence: Not At Risk  ? Fear of Current or Ex-Partner: No  ? Emotionally Abused: No  ? Physically Abused: No  ? Sexually Abused: No  ? ?Family History  ?Problem Relation Age of Onset  ? Thyroid disease Mother   ? Stroke Father   ? Hypertension Brother   ? Lung cancer Maternal Uncle   ? Cancer Maternal Grandmother   ?     Liver  ? Lung cancer Maternal Uncle   ? Hypertension Daughter   ? Hypercholesterolemia Daughter   ? Hypercholesterolemia Daughter   ? ? ?Objective: ?Office vital signs  reviewed. ?BP (!) 128/56   Pulse 61   Temp (!) 97.1 ?F (36.2 ?C)   Ht '5\' 5"'$  (1.651 m)   Wt 113 lb 9.6 oz (51.5 kg)   SpO2 90%   BMI 18.90 kg/m?  ? ?Physical Examination:  ?General: Awake, alert, well appearing thin elderly female, No acute distress ?HEENT:  sclera  white, MMM ?Cardio: regular rate and rhythm, S1S2 heard, 3/5 SEM that radiates to the carotids  ?Pulm: clear to auscultation bilaterally, no wheezes, rhonchi or rales; normal work of breathing on room air ?MSK: independent gait ?Neuro: no tremor ? ?Assessment/ Plan: ?88 y.o. female  ? ?Blood loss anemia ? ?Stage 3b chronic kidney disease ([place]) ? ?Paroxysmal atrial fibrillation ([facility]) ? ?Hypothyroidism due to acquired atrophy of thyroid ? ?Seems to be doing well and is stable with iron supplementation.  I reviewed her last labs with her and gave her a copy that she may provide this to her nephrologist at their upcoming appointment ? ?Renal disease is chronic and stable at GFR of greater than 30.  Vitamin D level appropriate ? ?She seems both rate and rhythm controlled today.  Cardiac exam was notable for a loud systolic murmur consistent with aortic regurgitation ? ?She is asymptomatic except for mild fatigue from a thyroid standpoint.  Her thyroid labs were normal and so these were not repeated ? ?No orders of the defined types were placed in this encounter. ? ?No orders of the defined types were placed in this encounter. ? ? ? ?Janet Norlander, DO ?[place] ?[phone] ? ? ?

## 2021-12-12 ENCOUNTER — Other Ambulatory Visit: Payer: Self-pay | Admitting: Family Medicine

## 2021-12-12 DIAGNOSIS — E034 Atrophy of thyroid (acquired): Secondary | ICD-10-CM

## 2021-12-17 DIAGNOSIS — E785 Hyperlipidemia, unspecified: Secondary | ICD-10-CM | POA: Diagnosis not present

## 2021-12-17 DIAGNOSIS — D649 Anemia, unspecified: Secondary | ICD-10-CM | POA: Diagnosis not present

## 2021-12-17 DIAGNOSIS — N2889 Other specified disorders of kidney and ureter: Secondary | ICD-10-CM | POA: Diagnosis not present

## 2021-12-17 DIAGNOSIS — I129 Hypertensive chronic kidney disease with stage 1 through stage 4 chronic kidney disease, or unspecified chronic kidney disease: Secondary | ICD-10-CM | POA: Diagnosis not present

## 2021-12-17 DIAGNOSIS — N2581 Secondary hyperparathyroidism of renal origin: Secondary | ICD-10-CM | POA: Diagnosis not present

## 2021-12-17 DIAGNOSIS — N183 Chronic kidney disease, stage 3 unspecified: Secondary | ICD-10-CM | POA: Diagnosis not present

## 2021-12-24 ENCOUNTER — Inpatient Hospital Stay (HOSPITAL_COMMUNITY): Payer: PPO

## 2021-12-24 ENCOUNTER — Inpatient Hospital Stay (HOSPITAL_COMMUNITY): Payer: PPO | Attending: Physician Assistant | Admitting: Physician Assistant

## 2021-12-24 VITALS — BP 174/53 | HR 60 | Temp 98.2°F | Resp 18 | Ht 65.0 in | Wt 111.9 lb

## 2021-12-24 DIAGNOSIS — D696 Thrombocytopenia, unspecified: Secondary | ICD-10-CM

## 2021-12-24 DIAGNOSIS — D649 Anemia, unspecified: Secondary | ICD-10-CM | POA: Insufficient documentation

## 2021-12-24 DIAGNOSIS — D500 Iron deficiency anemia secondary to blood loss (chronic): Secondary | ICD-10-CM | POA: Diagnosis not present

## 2021-12-24 DIAGNOSIS — C50511 Malignant neoplasm of lower-outer quadrant of right female breast: Secondary | ICD-10-CM | POA: Insufficient documentation

## 2021-12-24 DIAGNOSIS — N183 Chronic kidney disease, stage 3 unspecified: Secondary | ICD-10-CM | POA: Insufficient documentation

## 2021-12-24 DIAGNOSIS — Z79899 Other long term (current) drug therapy: Secondary | ICD-10-CM | POA: Diagnosis not present

## 2021-12-24 DIAGNOSIS — E538 Deficiency of other specified B group vitamins: Secondary | ICD-10-CM | POA: Diagnosis not present

## 2021-12-24 DIAGNOSIS — Z170 Estrogen receptor positive status [ER+]: Secondary | ICD-10-CM | POA: Diagnosis not present

## 2021-12-24 DIAGNOSIS — Z79811 Long term (current) use of aromatase inhibitors: Secondary | ICD-10-CM | POA: Diagnosis not present

## 2021-12-24 DIAGNOSIS — I4891 Unspecified atrial fibrillation: Secondary | ICD-10-CM | POA: Insufficient documentation

## 2021-12-24 DIAGNOSIS — E039 Hypothyroidism, unspecified: Secondary | ICD-10-CM | POA: Insufficient documentation

## 2021-12-24 LAB — CBC WITH DIFFERENTIAL/PLATELET
Abs Immature Granulocytes: 0.01 10*3/uL (ref 0.00–0.07)
Basophils Absolute: 0 10*3/uL (ref 0.0–0.1)
Basophils Relative: 0 %
Eosinophils Absolute: 0 10*3/uL (ref 0.0–0.5)
Eosinophils Relative: 1 %
HCT: 31.6 % — ABNORMAL LOW (ref 36.0–46.0)
Hemoglobin: 10.3 g/dL — ABNORMAL LOW (ref 12.0–15.0)
Immature Granulocytes: 0 %
Lymphocytes Relative: 24 %
Lymphs Abs: 0.6 10*3/uL — ABNORMAL LOW (ref 0.7–4.0)
MCH: 29.5 pg (ref 26.0–34.0)
MCHC: 32.6 g/dL (ref 30.0–36.0)
MCV: 90.5 fL (ref 80.0–100.0)
Monocytes Absolute: 0.2 10*3/uL (ref 0.1–1.0)
Monocytes Relative: 10 %
Neutro Abs: 1.6 10*3/uL — ABNORMAL LOW (ref 1.7–7.7)
Neutrophils Relative %: 65 %
Platelets: 129 10*3/uL — ABNORMAL LOW (ref 150–400)
RBC: 3.49 MIL/uL — ABNORMAL LOW (ref 3.87–5.11)
RDW: 14.2 % (ref 11.5–15.5)
WBC: 2.5 10*3/uL — ABNORMAL LOW (ref 4.0–10.5)
nRBC: 0 % (ref 0.0–0.2)

## 2021-12-24 NOTE — Progress Notes (Signed)
? ?[facility] ?[address] ?[place], [place] [address] ? ? ?CLINIC:  ?Medical Oncology/Hematology ? ?PCP:  ?[name], DO ?[address] [place] [address] ?[phone] ? ? ?REASON FOR VISIT:  ?Follow-up for normocytic anemia and history of stage I right breast cancer ? ?BRIEF ONCOLOGIC HISTORY:  ?Oncology History Overview Note  ?Cancer Staging ?Malignant neoplasm of lower-outer quadrant of right breast of [name], estrogen receptor positive ([place]) ?Staging form: Breast, AJCC 8th Edition ?- Clinical stage from 02/22/2018: Stage IA (cT1c, cN0, cM0, G1, ER+, PR+, HER2-) - Signed by Truitt Merle, MD on 03/02/2018 ? ?  ?Malignant neoplasm of lower-outer quadrant of right breast of [name], estrogen receptor positive ([place])  ?02/12/2018 Mammogram  ? She had routine screening bilateral mammography on 02/12/2018 at [facility] with results showing: indeterminate irregular mass in the right breast.  ?  ?02/16/2018 Mammogram  ? She underwent right diagnostic mammography with tomography and right breast ultrasonography at [facility] on 02/16/2018 showing: 1.1 cm irregular mass at the 8 O'clock position on the right breast ?  ?02/22/2018 Pathology Results  ? Diagnosis ?1. Breast, right, needle core biopsy, lateral, 8 o'clock ?- INVASIVE AND IN SITU DUCTAL CARCINOMA. ?- SEE COMMENT. ?2. Breast, right, needle core biopsy, 11 o'clock ?- FIBROCYSTIC CHANGES. ?- USUAL DUCTAL HYPERPLASIA. ?- THERE IS NO EVIDENCE OF MALIGNANCY. ? ?  ?02/22/2018 Receptors her2  ? IMMUNOHISTOCHEMICAL AND MORPHOMETRIC ANALYSIS PERFORMED MANUALLY ?Estrogen Receptor: 100%, POSITIVE, STRONG STAINING INTENSITY ?Progesterone Receptor: 40%, POSITIVE, MODERATE STAINING INTENSITY ?Proliferation Marker Ki67: 1% ?Her2 Negative ? ?  ?02/22/2018 Cancer Staging  ? Staging form: Breast, AJCC 8th Edition ?- Clinical stage from 02/22/2018: Stage IA (cT1c, cN0, cM0, G1, ER+, PR+, HER2-) - Signed by Truitt Merle, MD on 03/02/2018 ? ?  ?02/25/2018 Initial Diagnosis  ? Malignant  neoplasm of lower-outer quadrant of right breast of [name], estrogen receptor positive ([place]) ? ?  ? ? ?CANCER STAGING: ? Cancer Staging  ?Malignant neoplasm of lower-outer quadrant of right breast of [name], estrogen receptor positive ([place]) ?Staging form: Breast, AJCC 8th Edition ?- Clinical stage from 02/22/2018: Stage IA (cT1c, cN0, cM0, G1, ER+, PR+, HER2-) - Signed by Truitt Merle, MD on 03/02/2018 ? ? ?INTERVAL HISTORY:  ?Janet [name], Janet 88 y.o. [name], returns for routine follow-up of her normocytic anemia and history of stage I right-sided breast cancer. Janet [name] was last seen on 09/25/2021 by Dr. Delton Coombes.  ? ?Her breast cancer follow-up was addressed by Dr. Delton Coombes at her visit 3 months ago on 09/25/2021.  She has not noticed any new breast lumps and is continuing to take her letrozole.   ? ?Main issue to be addressed today is her normocytic anemia and her worsening leukopenia and thrombocytopenia.  She denies any recent signs of blood loss such as bright red blood per rectum, melena, or epistaxis.  She admits to easy bruising but denies petechial rash.  She reports that her energy is at baseline, with some chronic fatigue and energy around 70%.  She has chronic dyspnea on exertion which is stable at baseline.  She denies any chest pain, lightheadedness, syncope, or headaches.  She has not had any recent or recurrent infections.  She denies any new lumps or bumps.  She has not had any B symptoms such as fever, chills, night sweats, unintentional weight loss. ? ?She reports 70% energy and 100% appetite.  She is maintaining stable weight at this time. ? ? ?REVIEW OF SYSTEMS:  ?Review of Systems  ?  Constitutional:  Positive for fatigue. Negative for appetite change, chills, diaphoresis, fever and unexpected weight change.  ?HENT:   Negative for lump/mass and nosebleeds.   ?Eyes:  Negative for eye problems.  ?Respiratory:  Positive for shortness of breath (with exertion). Negative for cough and hemoptysis.    ?Cardiovascular:  Negative for chest pain, leg swelling and palpitations.  ?Gastrointestinal:  Negative for abdominal pain, blood in stool, constipation, diarrhea, nausea and vomiting.  ?Genitourinary:  Negative for hematuria.   ?Skin: Negative.   ?Neurological:  Negative for dizziness, headaches and light-headedness.  ?Hematological:  Bruises/bleeds easily (bruising).  ? ?PAST MEDICAL/SURGICAL HISTORY:  ?Past Medical History:  ?Diagnosis Date  ? Anemia   ? Arthritis   ? Asthma   ? Atrial fibrillation ([place])   ? Basal cell carcinoma 02/06/2009  ? Left ear targus- ([facility])  ? Basal cell carcinoma 09/28/2008  ? Right back-(CX35FU)  ? CKD (chronic kidney disease), stage III ([place])   ? Heart murmur   ? Hyperlipidemia   ? Hypertension   ? Hypothyroidism   ? Nodule of right lung   ? Right upper lobe  ? PONV (postoperative nausea and vomiting)   ? Renal insufficiency   ? Chronic  ? Renal vascular disease   ? Right renal artery stenosis ([place]) 05/09/2015  ? Squamous cell carcinoma of skin 09/07/2019  ? KA-Left shin-txpbx  ? Vertigo   ? ?Past Surgical History:  ?Procedure Laterality Date  ? BIOPSY  04/09/2021  ? Procedure: BIOPSY;  Surgeon: Montez Morita, Quillian Quince, MD;  [facility];  Service: Gastroenterology;;  ? BREAST LUMPECTOMY WITH RADIOACTIVE SEED LOCALIZATION Right 04/08/2018  ? Procedure: BREAST LUMPECTOMY WITH RADIOACTIVE SEED LOCALIZATION;  Surgeon: Rolm Bookbinder, MD;  Location: [facility];  Service: General;  Laterality: Right;  ? COLONOSCOPY  2016  ? diverticulosis, hemorrhoids  ? ESOPHAGOGASTRODUODENOSCOPY  2016  ? normal  ? ESOPHAGOGASTRODUODENOSCOPY (EGD) WITH PROPOFOL N/Janet 04/09/2021  ? Procedure: ESOPHAGOGASTRODUODENOSCOPY (EGD) WITH PROPOFOL;  Surgeon: Harvel Quale, MD;  [facility];  Service: Gastroenterology;  Laterality: N/Janet;  12:45  ? ESOPHAGOGASTRODUODENOSCOPY (EGD) WITH PROPOFOL N/Janet 09/01/2021  ? Procedure: ESOPHAGOGASTRODUODENOSCOPY (EGD) WITH PROPOFOL;  Surgeon: Eloise Harman, DO;  [facility];  Service: Endoscopy;  Laterality: N/Janet;  ? IR GENERIC HISTORICAL  08/29/2016  ? IR US GUIDE VASC ACCESS RIGHT 08/29/2016 [facility]  ? IR GENERIC HISTORICAL  08/29/2016  ? IR RENAL BILAT S&I MOD SED 08/29/2016 [facility]  ? KNEE ARTHROSCOPY Right   ? 2019  ? THYROIDECTOMY, PARTIAL Right 1981  ? middle lobe removed 1st, then right lobe removed 7-8 years later (approx. 1988)  ? ? ?SOCIAL HISTORY:  ?Social History  ? ?Socioeconomic History  ? Marital status: Widowed  ?  Spouse name: Not on file  ? Number of children: 2  ? Years of education: 43  ? Highest education level: Not on file  ?Occupational History  ? Occupation: Retired  ?Tobacco Use  ? Smoking status: Never  ? Smokeless tobacco: Never  ?Vaping Use  ? Vaping Use: Never used  ?Substance and Sexual Activity  ? Alcohol use: No  ? Drug use: No  ? Sexual activity: Never  ?Other Topics Concern  ? Not on file  ?Social History Narrative  ? Lives alone, but her daughter stays there at night  ? Ambidextrous (writes with left hand).  ? No caffeine use.  ? ?Social Determinants of Health  ? ?Financial Resource Strain: Low Risk   ?  Difficulty of Paying Living Expenses: Not hard at all  ?Food Insecurity: No Food Insecurity  ? Worried About Charity fundraiser in the Last Year: Never true  ? Ran Out of Food in the Last Year: Never true  ?Transportation Needs: No Transportation Needs  ? Lack of Transportation (Medical): No  ? Lack of Transportation (Non-Medical): No  ?Physical Activity: Inactive  ? Days of Exercise per Week: 0 days  ? Minutes of Exercise per Session: 0 min  ?Stress: No Stress Concern Present  ? Feeling of Stress : Not at all  ?Social Connections: Moderately Isolated  ? Frequency of Communication with Friends and Family: More than three times Janet week  ? Frequency of Social Gatherings with Friends and Family: More than three times Janet week  ? Attends Religious Services: More than 4 times per year  ? Active Member of Clubs  or Organizations: No  ? Attends Archivist Meetings: Never  ? Marital Status: Widowed  ?Intimate Partner Violence: Not At Risk  ? Fear of Current or Ex-Partner: No  ? Emotionally Abused: No  ? [place]

## 2021-12-24 NOTE — Patient Instructions (Signed)
[facility] ?Discharge Instructions ? ?You were seen today by Rebekah Pennington PA-C for your low blood counts. ? ?LOW BLOOD COUNTS:  Your low hemoglobin/red blood cells may be related to your chronic kidney disease.  You may also have some underlying bone marrow dysfunction, such as a condition called MDS that can only be proven with bone marrow biopsy.  We will continue to watch your labs trend at future appointments, but if you have any worsening of your blood counts, it would be reasonable to check a bone marrow biopsy in the future. ? ?IRON: Your iron level is elevated.  Instead of taking iron twice daily, you should cut back on your iron to taking it only ONCE daily. ? ?VITAMIN B12: Your vitamin B12 level is slightly low.  You should start taking over-the-counter vitamin B12 supplement 500 mcg daily. ? ?FOLLOW-UP APPOINTMENT: Repeat labs, mammogram, and office visit with Dr. Katragadda in 3 months. ? ? ?Thank you for choosing [facility] at [facility] to provide your oncology and hematology care.  To afford each patient quality time with our provider, please arrive at least 15 minutes before your scheduled appointment time.  ? ?If you have a lab appointment with the Cancer Center please come in thru the Main Entrance and check in at the main information desk. ? ?You need to re-schedule your appointment should you arrive 10 or more minutes late.  We strive to give you quality time with our providers, and arriving late affects you and other patients whose appointments are after yours.  Also, if you no show three or more times for appointments you may be dismissed from the clinic at the providers discretion.     ?Again, thank you for choosing [facility].  Our hope is that these requests will decrease the amount of time that you wait before being seen by our physicians.        ?_____________________________________________________________ ? ?Should you have questions after your visit to [facility], please contact our office at [phone] and follow the prompts.  Our office hours are 8:00 a.m. and 4:30 p.m. Monday - Friday.  Please note that voicemails left after 4:00 p.m. may not be returned until the following business day.  We are closed weekends and major holidays.  You do have access to a nurse 24-7, just call the main number to the clinic [phone] and do not press any options, hold on the line and a nurse will answer the phone.   ? ?For prescription refill requests, have your pharmacy contact our office and allow 72 hours.   ? ?Due to Covid, you will need to wear a mask upon entering the hospital. If you do not have a mask, a mask will be given to you at the Main Entrance upon arrival. For doctor visits, patients may have 1 support person age 18 or older with them. For treatment visits, patients can not have anyone with them due to social distancing guidelines and our immunocompromised population.  ? ? ? ?

## 2021-12-30 ENCOUNTER — Ambulatory Visit: Payer: PPO | Admitting: Family Medicine

## 2022-01-05 ENCOUNTER — Other Ambulatory Visit (HOSPITAL_COMMUNITY): Payer: Self-pay | Admitting: Hematology

## 2022-01-06 ENCOUNTER — Other Ambulatory Visit (HOSPITAL_COMMUNITY): Payer: Self-pay | Admitting: *Deleted

## 2022-01-06 NOTE — Telephone Encounter (Signed)
Refill sent in per request for Femara.  Per last ovn, patient tolerating and is to continue therapy.

## 2022-01-07 ENCOUNTER — Other Ambulatory Visit: Payer: Self-pay | Admitting: Family Medicine

## 2022-01-21 DIAGNOSIS — C50919 Malignant neoplasm of unspecified site of unspecified female breast: Secondary | ICD-10-CM | POA: Diagnosis not present

## 2022-01-21 DIAGNOSIS — Z79811 Long term (current) use of aromatase inhibitors: Secondary | ICD-10-CM | POA: Diagnosis not present

## 2022-01-21 DIAGNOSIS — Z515 Encounter for palliative care: Secondary | ICD-10-CM | POA: Diagnosis not present

## 2022-01-21 DIAGNOSIS — Z9011 Acquired absence of right breast and nipple: Secondary | ICD-10-CM | POA: Diagnosis not present

## 2022-01-21 DIAGNOSIS — D61818 Other pancytopenia: Secondary | ICD-10-CM | POA: Diagnosis not present

## 2022-01-21 DIAGNOSIS — D696 Thrombocytopenia, unspecified: Secondary | ICD-10-CM | POA: Diagnosis not present

## 2022-02-04 ENCOUNTER — Other Ambulatory Visit (INDEPENDENT_AMBULATORY_CARE_PROVIDER_SITE_OTHER): Payer: Self-pay | Admitting: Gastroenterology

## 2022-02-04 DIAGNOSIS — R630 Anorexia: Secondary | ICD-10-CM

## 2022-02-27 ENCOUNTER — Ambulatory Visit: Payer: PPO | Admitting: Physician Assistant

## 2022-02-27 ENCOUNTER — Encounter: Payer: Self-pay | Admitting: Physician Assistant

## 2022-02-27 ENCOUNTER — Telehealth: Payer: Self-pay | Admitting: Physician Assistant

## 2022-02-27 DIAGNOSIS — L110 Acquired keratosis follicularis: Secondary | ICD-10-CM | POA: Diagnosis not present

## 2022-02-27 DIAGNOSIS — L989 Disorder of the skin and subcutaneous tissue, unspecified: Secondary | ICD-10-CM

## 2022-02-27 DIAGNOSIS — L570 Actinic keratosis: Secondary | ICD-10-CM

## 2022-02-27 DIAGNOSIS — D485 Neoplasm of uncertain behavior of skin: Secondary | ICD-10-CM | POA: Diagnosis not present

## 2022-02-27 NOTE — Telephone Encounter (Signed)
Patient is calling to say that the lesion on her face seems to be draining clear fluid.  Patient would like to know what she needs to do.

## 2022-02-27 NOTE — Patient Instructions (Signed)

## 2022-02-27 NOTE — Telephone Encounter (Signed)
Patient was in the office today and she was advised it is Vaseline on the lesion.

## 2022-03-09 DIAGNOSIS — L03116 Cellulitis of left lower limb: Secondary | ICD-10-CM | POA: Diagnosis not present

## 2022-03-10 ENCOUNTER — Telehealth: Payer: Self-pay

## 2022-03-10 NOTE — Telephone Encounter (Signed)
-----   Message from Warren Danes, [place] sent at 03/06/2022  6:30 PM EDT ----- Benign

## 2022-03-10 NOTE — Telephone Encounter (Signed)
Phone call from patient returning our call. Patient aware of pathology results.

## 2022-03-10 NOTE — Telephone Encounter (Signed)
Phone call to patient with her pathology results. Voicemail left for patient to give the office a call back.

## 2022-03-13 ENCOUNTER — Ambulatory Visit
Admission: RE | Admit: 2022-03-13 | Discharge: 2022-03-13 | Disposition: A | Discharge status: Home / Self Care | Payer: Self-pay | Source: Ambulatory Visit | Attending: Hematology | Admitting: Hematology

## 2022-03-13 ENCOUNTER — Other Ambulatory Visit (HOSPITAL_COMMUNITY): Payer: Self-pay | Admitting: Hematology

## 2022-03-13 ENCOUNTER — Inpatient Hospital Stay
Admission: RE | Admit: 2022-03-13 | Discharge: 2022-03-13 | Disposition: A | Discharge status: Home / Self Care | Payer: Self-pay | Source: Ambulatory Visit | Attending: Hematology | Admitting: Hematology

## 2022-03-13 ENCOUNTER — Telehealth: Payer: Self-pay | Admitting: *Deleted

## 2022-03-13 DIAGNOSIS — C50511 Malignant neoplasm of lower-outer quadrant of right female breast: Secondary | ICD-10-CM

## 2022-03-13 DIAGNOSIS — D692 Other nonthrombocytopenic purpura: Secondary | ICD-10-CM | POA: Diagnosis not present

## 2022-03-13 DIAGNOSIS — I8390 Asymptomatic varicose veins of unspecified lower extremity: Secondary | ICD-10-CM | POA: Diagnosis not present

## 2022-03-13 DIAGNOSIS — L03116 Cellulitis of left lower limb: Secondary | ICD-10-CM | POA: Diagnosis not present

## 2022-03-13 DIAGNOSIS — S80812D Abrasion, left lower leg, subsequent encounter: Secondary | ICD-10-CM | POA: Diagnosis not present

## 2022-03-13 NOTE — Telephone Encounter (Signed)
-----   Message from Warren Danes, [place] sent at 03/12/2022  9:13 AM EDT ----- Check status please.

## 2022-03-13 NOTE — Telephone Encounter (Signed)
Path to patient no appointment necessary.

## 2022-03-17 ENCOUNTER — Other Ambulatory Visit (HOSPITAL_COMMUNITY): Payer: Self-pay

## 2022-03-17 DIAGNOSIS — D696 Thrombocytopenia, unspecified: Secondary | ICD-10-CM

## 2022-03-18 ENCOUNTER — Ambulatory Visit (HOSPITAL_COMMUNITY): Payer: PPO

## 2022-03-18 ENCOUNTER — Inpatient Hospital Stay (HOSPITAL_COMMUNITY): Admission: RE | Admit: 2022-03-18 | Payer: PPO | Source: Ambulatory Visit

## 2022-03-18 ENCOUNTER — Inpatient Hospital Stay: Payer: PPO | Attending: Hematology

## 2022-03-18 DIAGNOSIS — D696 Thrombocytopenia, unspecified: Secondary | ICD-10-CM

## 2022-03-18 DIAGNOSIS — N189 Chronic kidney disease, unspecified: Secondary | ICD-10-CM | POA: Insufficient documentation

## 2022-03-18 DIAGNOSIS — Z170 Estrogen receptor positive status [ER+]: Secondary | ICD-10-CM | POA: Diagnosis not present

## 2022-03-18 DIAGNOSIS — C50511 Malignant neoplasm of lower-outer quadrant of right female breast: Secondary | ICD-10-CM | POA: Diagnosis not present

## 2022-03-18 DIAGNOSIS — D631 Anemia in chronic kidney disease: Secondary | ICD-10-CM | POA: Diagnosis not present

## 2022-03-18 DIAGNOSIS — Z79899 Other long term (current) drug therapy: Secondary | ICD-10-CM | POA: Diagnosis not present

## 2022-03-18 DIAGNOSIS — Z79811 Long term (current) use of aromatase inhibitors: Secondary | ICD-10-CM | POA: Insufficient documentation

## 2022-03-18 DIAGNOSIS — D500 Iron deficiency anemia secondary to blood loss (chronic): Secondary | ICD-10-CM

## 2022-03-18 DIAGNOSIS — D509 Iron deficiency anemia, unspecified: Secondary | ICD-10-CM | POA: Diagnosis not present

## 2022-03-18 DIAGNOSIS — E538 Deficiency of other specified B group vitamins: Secondary | ICD-10-CM | POA: Diagnosis not present

## 2022-03-18 LAB — FERRITIN: Ferritin: 426 ng/mL — ABNORMAL HIGH (ref 11–307)

## 2022-03-18 LAB — CBC WITH DIFFERENTIAL/PLATELET
Abs Immature Granulocytes: 0.01 10*3/uL (ref 0.00–0.07)
Basophils Absolute: 0 10*3/uL (ref 0.0–0.1)
Basophils Relative: 0 %
Eosinophils Absolute: 0 10*3/uL (ref 0.0–0.5)
Eosinophils Relative: 1 %
HCT: 30.1 % — ABNORMAL LOW (ref 36.0–46.0)
Hemoglobin: 10 g/dL — ABNORMAL LOW (ref 12.0–15.0)
Immature Granulocytes: 0 %
Lymphocytes Relative: 27 %
Lymphs Abs: 0.6 10*3/uL — ABNORMAL LOW (ref 0.7–4.0)
MCH: 29.3 pg (ref 26.0–34.0)
MCHC: 33.2 g/dL (ref 30.0–36.0)
MCV: 88.3 fL (ref 80.0–100.0)
Monocytes Absolute: 0.2 10*3/uL (ref 0.1–1.0)
Monocytes Relative: 10 %
Neutro Abs: 1.4 10*3/uL — ABNORMAL LOW (ref 1.7–7.7)
Neutrophils Relative %: 62 %
Platelets: 109 10*3/uL — ABNORMAL LOW (ref 150–400)
RBC: 3.41 MIL/uL — ABNORMAL LOW (ref 3.87–5.11)
RDW: 14.2 % (ref 11.5–15.5)
WBC: 2.3 10*3/uL — ABNORMAL LOW (ref 4.0–10.5)
nRBC: 0 % (ref 0.0–0.2)

## 2022-03-18 LAB — IRON AND TIBC
Iron: 46 ug/dL (ref 28–170)
Saturation Ratios: 20 % (ref 10.4–31.8)
TIBC: 235 ug/dL — ABNORMAL LOW (ref 250–450)
UIBC: 189 ug/dL

## 2022-03-18 LAB — VITAMIN B12: Vitamin B-12: 588 pg/mL (ref 180–914)

## 2022-03-19 ENCOUNTER — Encounter: Payer: Self-pay | Admitting: Family Medicine

## 2022-03-19 ENCOUNTER — Ambulatory Visit (INDEPENDENT_AMBULATORY_CARE_PROVIDER_SITE_OTHER): Payer: PPO | Admitting: Family Medicine

## 2022-03-19 VITALS — BP 158/79 | HR 71 | Temp 98.1°F | Resp 18 | Ht 66.0 in | Wt 108.2 lb

## 2022-03-19 DIAGNOSIS — H11823 Conjunctivochalasis, bilateral: Secondary | ICD-10-CM | POA: Diagnosis not present

## 2022-03-19 DIAGNOSIS — H40033 Anatomical narrow angle, bilateral: Secondary | ICD-10-CM | POA: Diagnosis not present

## 2022-03-19 DIAGNOSIS — L249 Irritant contact dermatitis, unspecified cause: Secondary | ICD-10-CM | POA: Diagnosis not present

## 2022-03-19 DIAGNOSIS — H2513 Age-related nuclear cataract, bilateral: Secondary | ICD-10-CM | POA: Diagnosis not present

## 2022-03-19 DIAGNOSIS — H43813 Vitreous degeneration, bilateral: Secondary | ICD-10-CM | POA: Diagnosis not present

## 2022-03-19 MED ORDER — PREDNISONE 20 MG PO TABS: ORAL_TABLET | ORAL | 0 refills | Status: DC

## 2022-03-19 MED ORDER — METHYLPREDNISOLONE ACETATE 40 MG/ML IJ SUSP
40.0000 mg | Freq: Once | INTRAMUSCULAR | Status: AC
  Administered 2022-03-19: 40 mg via INTRAMUSCULAR

## 2022-03-19 MED ORDER — FAMOTIDINE 20 MG PO TABS: 20.0000 mg | ORAL_TABLET | Freq: Two times a day (BID) | ORAL | 0 refills | Status: DC

## 2022-03-19 NOTE — Progress Notes (Signed)
Subjective:  Patient ID: [name], female    DOB: 1934-02-08, 88 y.o.   MRN: [id number]  Patient Care Team: [name], DO as PCP - General (Family Medicine) Minus Breeding, MD as PCP - Cardiology (Cardiology) Rolm Bookbinder, MD as Consulting Physician (General Surgery) Eppie Gibson, MD as Attending Physician (Radiation Oncology) Truitt Merle, MD as Consulting Physician (Hematology) Lavonna Monarch, MD as Consulting Physician (Dermatology) Warren Danes, PA-C as Physician Assistant (Dermatology) Elmarie Shiley, MD as Consulting Physician (Nephrology) [facility]   Chief Complaint:  Allergic Reaction   HPI: [name] is a 88 y.o. female presenting on 03/19/2022 for Allergic Reaction   Pt presents today with complaints of pruritic rash to right buttock and right elbow. States she woke up this morning at 0300 to go to the restroom and the rash was present. Denies new medications, household products, hygiene products, animals, or recent exterminator visit to home.   Allergic Reaction This is a recurrent problem. The current episode started today. The problem is unchanged. The problem is moderate. It is unknown what she was exposed to. Associated symptoms include itching and a rash. Pertinent negatives include no abdominal pain, chest pain, chest pressure, coughing, diarrhea, difficulty breathing, drooling, eye itching, eye redness, eye watering, globus sensation, hyperventilation, stridor, trouble swallowing, vomiting or wheezing. There is no swelling present. Past treatments include diphenhydramine. The treatment provided mild relief.     Relevant past medical, surgical, family, and social history reviewed and updated as indicated.  Allergies and medications reviewed and updated. Data reviewed: Chart in Epic.   Past Medical History:  Diagnosis Date   Anemia    Arthritis    Asthma    Atrial fibrillation ([place])    Basal cell carcinoma  02/06/2009   Left ear targus- ([facility])   Basal cell carcinoma 09/28/2008   Right back-(CX35FU)   CKD (chronic kidney disease), stage III (HCC)    Heart murmur    Hyperlipidemia    Hypertension    Hypothyroidism    Nodule of right lung    Right upper lobe   PONV (postoperative nausea and vomiting)    Renal insufficiency    Chronic   Renal vascular disease    Right renal artery stenosis ([place]) 05/09/2015   Squamous cell carcinoma of skin 09/07/2019   KA-Left shin-txpbx   Vertigo     Past Surgical History:  Procedure Laterality Date   BIOPSY  04/09/2021   Procedure: BIOPSY;  Surgeon: Harvel Quale, MD;  [facility];  Service: Gastroenterology;;   BREAST LUMPECTOMY WITH RADIOACTIVE SEED LOCALIZATION Right 04/08/2018   Procedure: BREAST LUMPECTOMY WITH RADIOACTIVE SEED LOCALIZATION;  Surgeon: Rolm Bookbinder, MD;  Location: [facility];  Service: General;  Laterality: Right;   COLONOSCOPY  2016   diverticulosis, hemorrhoids   ESOPHAGOGASTRODUODENOSCOPY  2016   normal   ESOPHAGOGASTRODUODENOSCOPY (EGD) WITH PROPOFOL N/A 04/09/2021   Procedure: ESOPHAGOGASTRODUODENOSCOPY (EGD) WITH PROPOFOL;  Surgeon: Harvel Quale, MD;  [facility];  Service: Gastroenterology;  Laterality: N/A;  12:45   ESOPHAGOGASTRODUODENOSCOPY (EGD) WITH PROPOFOL N/A 09/01/2021   Procedure: ESOPHAGOGASTRODUODENOSCOPY (EGD) WITH PROPOFOL;  Surgeon: Eloise Harman, DO;  [facility];  Service: Endoscopy;  Laterality: N/A;   IR GENERIC HISTORICAL  08/29/2016   IR US GUIDE VASC ACCESS RIGHT 08/29/2016 [facility]   IR GENERIC HISTORICAL  08/29/2016   IR RENAL BILAT S&I MOD SED 08/29/2016 [facility]   KNEE ARTHROSCOPY Right  2019   THYROIDECTOMY, PARTIAL Right 1981   middle lobe removed 1st, then right lobe removed 7-8 years later (approx. 7)    Social History   Socioeconomic History   Marital status: Widowed    Spouse name: Not on file   Number of  children: 2   Years of education: 30   Highest education level: Not on file  Occupational History   Occupation: Retired  Tobacco Use   Smoking status: Never   Smokeless tobacco: Never  Vaping Use   Vaping Use: Never used  Substance and Sexual Activity   Alcohol use: No   Drug use: No   Sexual activity: Never  Other Topics Concern   Not on file  Social History Narrative   Lives alone, but her daughter stays there at night   Ambidextrous (writes with left hand).   No caffeine use.   Social Determinants of Health   Financial Resource Strain: Low Risk  (04/26/2021)   Overall Financial Resource Strain (CARDIA)    Difficulty of Paying Living Expenses: Not hard at all  Food Insecurity: No Food Insecurity (04/26/2021)   Hunger Vital Sign    Worried About Running Out of Food in the Last Year: Never true    Ran Out of Food in the Last Year: Never true  Transportation Needs: No Transportation Needs (04/26/2021)   PRAPARE - Hydrologist (Medical): No    Lack of Transportation (Non-Medical): No  Physical Activity: Inactive (04/26/2021)   Exercise Vital Sign    Days of Exercise per Week: 0 days    Minutes of Exercise per Session: 0 min  Stress: No Stress Concern Present (04/26/2021)   [place]    Feeling of Stress : Not at all  Social Connections: Moderately Isolated (04/26/2021)   Social Connection and Isolation Panel [NHANES]    Frequency of Communication with Friends and Family: More than three times a week    Frequency of Social Gatherings with Friends and Family: More than three times a week    Attends Religious Services: More than 4 times per year    Active Member of [employer] or Organizations: No    Attends Archivist Meetings: Never    Marital Status: Widowed  Intimate Partner Violence: Not At Risk (04/26/2021)   Humiliation, Afraid, Rape, and Kick questionnaire    Fear of Current or  Ex-Partner: No    Emotionally Abused: No    Physically Abused: No    Sexually Abused: No    Outpatient Encounter Medications as of 03/19/2022  Medication Sig   acetaminophen (TYLENOL) 325 MG tablet Take 325 mg by mouth daily as needed for moderate pain or headache.   atorvastatin (LIPITOR) 20 MG tablet TAKE 1/2 TABLET BY MOUTH EVERY DAY   bisoprolol (ZEBETA) 5 MG tablet Take 1 tablet (5 mg total) by mouth daily.   cholecalciferol (VITAMIN D) 1000 units tablet Take 1,000 Units by mouth daily.   famotidine (PEPCID) 20 MG tablet Take 1 tablet (20 mg total) by mouth 2 (two) times daily for 14 days.   ferrous sulfate 325 (65 FE) MG tablet Take 325 mg by mouth 2 (two) times daily with a meal.   flecainide (TAMBOCOR) 50 MG tablet Take 1 tablet (50 mg total) by mouth 2 (two) times daily.   hydrALAZINE (APRESOLINE) 100 MG tablet Take 100 mg by mouth 2 (two) times daily.   hydroxypropyl methylcellulose / hypromellose (ISOPTO TEARS /  GONIOVISC) 2.5 % ophthalmic solution Place 1 drop into both eyes daily.   letrozole (FEMARA) 2.5 MG tablet TAKE 1 TABLET BY MOUTH EVERY DAY   levothyroxine (SYNTHROID) 75 MCG tablet TAKE 1 TABLET BY MOUTH EVERY DAY   mirtazapine (REMERON) 7.5 MG tablet TAKE 1 TABLET BY MOUTH AT BEDTIME.   pantoprazole (PROTONIX) 40 MG tablet Take 1 tablet (40 mg total) by mouth daily.   predniSONE (DELTASONE) 20 MG tablet 2 po at sametime daily for 5 days- start tomorrow   losartan (COZAAR) 25 MG tablet Take 1 tablet (25 mg total) by mouth daily.   No facility-administered encounter medications on file as of 03/19/2022.    Allergies  Allergen Reactions   Amlodipine Other (See Comments)    edema   Lisinopril     cough   Xeroform Occlusive Gauze Strip [Bismuth Tribromoph-Petrolatum]    Isosorbide Nitrate Nausea Only   Sulfonamide Derivatives Nausea Only and Rash   Terazosin Hives and Nausea Only    Review of Systems  Constitutional:  Negative for activity change, appetite change,  chills, diaphoresis, fatigue, fever and unexpected weight change.  HENT:  Negative for congestion, drooling, sore throat, trouble swallowing and voice change.   Eyes:  Negative for photophobia, redness, itching and visual disturbance.  Respiratory:  Negative for apnea, cough, choking, chest tightness, shortness of breath, wheezing and stridor.   Cardiovascular:  Negative for chest pain and leg swelling.  Gastrointestinal:  Negative for abdominal pain, diarrhea, nausea and vomiting.  Genitourinary:  Negative for decreased urine volume and difficulty urinating.  Skin:  Positive for color change, itching and rash. Negative for pallor and wound.       Pruritis  Neurological:  Negative for dizziness, weakness, light-headedness and headaches.  Psychiatric/Behavioral:  Negative for confusion.   All other systems reviewed and are negative.       Objective:  BP (!) 158/79   Pulse 71   Temp 98.1 F (36.7 C)   Resp 18   Ht '5\' 6"'$  (1.676 m)   Wt 108 lb 3.2 oz (49.1 kg)   SpO2 96%   BMI 17.46 kg/m    Wt Readings from Last 3 Encounters:  03/19/22 108 lb 3.2 oz (49.1 kg)  12/24/21 111 lb 14.4 oz (50.8 kg)  12/10/21 113 lb 9.6 oz (51.5 kg)    Physical Exam Vitals and nursing note reviewed.  Constitutional:      General: She is not in acute distress.    Appearance: Normal appearance. She is normal weight. She is not ill-appearing, toxic-appearing or diaphoretic.  HENT:     Head: Normocephalic and atraumatic.     Mouth/Throat:     Mouth: Mucous membranes are moist.     Pharynx: Oropharynx is clear. No pharyngeal swelling or uvula swelling.  Eyes:     Pupils: Pupils are equal, round, and reactive to light.  Cardiovascular:     Rate and Rhythm: Normal rate and regular rhythm.     Heart sounds: Murmur heard.     Systolic murmur is present with a grade of 3/6.  Pulmonary:     Effort: Pulmonary effort is normal. No respiratory distress.     Breath sounds: Normal breath sounds. No stridor.  No wheezing, rhonchi or rales.  Chest:     Chest wall: No tenderness.  Musculoskeletal:     Right lower leg: No edema.     Left lower leg: No edema.  Skin:    General: Skin is warm and dry.  Capillary Refill: Capillary refill takes less than 2 seconds.     Findings: Erythema and rash present. Rash is urticarial.     Comments: Urticarial rash to right buttock and right elbow  Neurological:     General: No focal deficit present.     Mental Status: She is alert and oriented to person, place, and time.  Psychiatric:        Mood and Affect: Mood normal.        Behavior: Behavior normal.        Thought Content: Thought content normal.        Judgment: Judgment normal.     Results for orders placed or performed in visit on 03/18/22  CBC with Differential  Result Value Ref Range   WBC 2.3 (L) 4.0 - 10.5 K/uL   RBC 3.41 (L) 3.87 - 5.11 MIL/uL   Hemoglobin 10.0 (L) 12.0 - 15.0 g/dL   HCT 30.1 (L) 36.0 - 46.0 %   MCV 88.3 80.0 - 100.0 fL   MCH 29.3 26.0 - 34.0 pg   MCHC 33.2 30.0 - 36.0 g/dL   RDW 14.2 11.5 - 15.5 %   Platelets 109 (L) 150 - 400 K/uL   nRBC 0.0 0.0 - 0.2 %   Neutrophils Relative % 62 %   Neutro Abs 1.4 (L) 1.7 - 7.7 K/uL   Lymphocytes Relative 27 %   Lymphs Abs 0.6 (L) 0.7 - 4.0 K/uL   Monocytes Relative 10 %   Monocytes Absolute 0.2 0.1 - 1.0 K/uL   Eosinophils Relative 1 %   Eosinophils Absolute 0.0 0.0 - 0.5 K/uL   Basophils Relative 0 %   Basophils Absolute 0.0 0.0 - 0.1 K/uL   Immature Granulocytes 0 %   Abs Immature Granulocytes 0.01 0.00 - 0.07 K/uL  Iron and TIBC  Result Value Ref Range   Iron 46 28 - 170 ug/dL   TIBC 235 (L) 250 - 450 ug/dL   Saturation Ratios 20 10.4 - 31.8 %   UIBC 189 ug/dL  Ferritin  Result Value Ref Range   Ferritin 426 (H) 11 - 307 ng/mL  Vitamin B12  Result Value Ref Range   Vitamin B-12 588 180 - 914 pg/mL       Pertinent labs & imaging results that were available during my care of the patient were reviewed by me  and considered in my medical decision making.  Assessment & Plan:  [name] was seen today for allergic reaction.  Diagnoses and all orders for this visit:  Irritant contact dermatitis, unspecified trigger No indications of anaphylaxis. Unknown trigger. Bursted with steroids in office. Start below tomorrow. Report new, worsening, or persistent symptoms. Follow up if not resolving.  -     predniSONE (DELTASONE) 20 MG tablet; 2 po at sametime daily for 5 days- start tomorrow -     famotidine (PEPCID) 20 MG tablet; Take 1 tablet (20 mg total) by mouth 2 (two) times daily for 14 days.     Continue all other maintenance medications.  Follow up plan: Return if symptoms worsen or fail to improve.   Continue healthy lifestyle choices, including diet (rich in fruits, vegetables, and lean proteins, and low in salt and simple carbohydrates) and exercise (at least 30 minutes of moderate physical activity daily).  Educational handout given for contact dermatitis  The above assessment and management plan was discussed with the patient. The patient verbalized understanding of and has agreed to the management plan. Patient is aware to  call the clinic if they develop any new symptoms or if symptoms persist or worsen. Patient is aware when to return to the clinic for a follow-up visit. Patient educated on when it is appropriate to go to the emergency department.   Monia Pouch, FNP-C [facility] [phone]

## 2022-03-19 NOTE — Addendum Note (Signed)
Addended by: Gerilyn Nestle on: 03/19/2022 09:41 AM   Modules accepted: Orders

## 2022-03-20 ENCOUNTER — Telehealth: Payer: Self-pay | Admitting: Family Medicine

## 2022-03-20 LAB — METHYLMALONIC ACID, SERUM: Methylmalonic Acid, Quantitative: 571 nmol/L — ABNORMAL HIGH (ref 0–378)

## 2022-03-20 NOTE — Telephone Encounter (Signed)
It works when you are having an allergic reaction to something. If rash is better she can stop taking

## 2022-03-20 NOTE — Telephone Encounter (Signed)
Pt has been notified.

## 2022-03-21 ENCOUNTER — Ambulatory Visit (HOSPITAL_COMMUNITY)
Admission: RE | Admit: 2022-03-21 | Discharge: 2022-03-21 | Disposition: A | Discharge status: Home / Self Care | Payer: PPO | Source: Ambulatory Visit | Attending: Hematology | Admitting: Hematology

## 2022-03-21 DIAGNOSIS — Z170 Estrogen receptor positive status [ER+]: Secondary | ICD-10-CM | POA: Insufficient documentation

## 2022-03-21 DIAGNOSIS — C50511 Malignant neoplasm of lower-outer quadrant of right female breast: Secondary | ICD-10-CM

## 2022-03-21 DIAGNOSIS — Z1231 Encounter for screening mammogram for malignant neoplasm of breast: Secondary | ICD-10-CM | POA: Insufficient documentation

## 2022-03-21 DIAGNOSIS — Z853 Personal history of malignant neoplasm of breast: Secondary | ICD-10-CM | POA: Diagnosis not present

## 2022-03-24 ENCOUNTER — Encounter: Payer: Self-pay | Admitting: Physician Assistant

## 2022-03-24 NOTE — Progress Notes (Signed)
   Follow-Up Visit   Subjective  [name] is a 88 y.o. female who presents for the following: Skin Problem (Patient here today for lesion on her right inner cheek x 3 weeks no bleeding, no pain. ).   The following portions of the chart were reviewed this encounter and updated as appropriate:  Tobacco  Allergies  Meds  Problems  Med Hx  Surg Hx  Fam Hx      Objective  Well appearing patient in no apparent distress; mood and affect are within normal limits.  A focused examination was performed including face. Relevant physical exam findings are noted in the Assessment and Plan.  Right Malar Cheek       Right Forearm - Posterior       Right Buccal Cheek, Right Malar Cheek Erythematous patches with gritty scale.   Assessment & Plan  Neoplasm of uncertain behavior of skin Right Malar Cheek  Skin / nail biopsy Type of biopsy: tangential   Informed consent: discussed and consent obtained   Timeout: patient name, date of birth, surgical site, and procedure verified   Procedure prep:  Patient was prepped and draped in usual sterile fashion (Non sterile) Prep type:  Chlorhexidine Anesthesia: the lesion was anesthetized in a standard fashion   Anesthetic:  1% lidocaine w/ epinephrine 1-100,000 local infiltration Instrument used: flexible razor blade   Outcome: patient tolerated procedure well   Post-procedure details: wound care instructions given    Destruction of lesion Complexity: simple   Destruction method: electrodesiccation and curettage   Informed consent: discussed and consent obtained   Timeout:  patient name, date of birth, surgical site, and procedure verified Anesthesia: the lesion was anesthetized in a standard fashion   Anesthetic:  1% lidocaine w/ epinephrine 1-100,000 local infiltration Curettage performed in three different directions: Yes   Curettage cycles:  3 Margin per side (cm):  0.1 Final wound size (cm):  1.2 Hemostasis achieved  with:  aluminum chloride Outcome: patient tolerated procedure well with no complications   Post-procedure details: wound care instructions given    Specimen 1 - Surgical pathology Differential Diagnosis: bcc vs [id number]  Check Margins: yes  Lesion of upper extremity Right Forearm - Posterior  Rtc if still present in 6 weeks  AK (actinic keratosis) (2) Right Malar Cheek; Right Buccal Cheek  Destruction of lesion - Right Buccal Cheek, Right Malar Cheek Complexity: simple   Destruction method: cryotherapy   Informed consent: discussed and consent obtained   Timeout:  patient name, date of birth, surgical site, and procedure verified Lesion destroyed using liquid nitrogen: Yes   Outcome: patient tolerated procedure well with no complications      I, Obrien Huskins, PA-C, have reviewed all documentation's for this visit.  The documentation on 03/24/22 for the exam, diagnosis, procedures and orders are all accurate and complete.

## 2022-03-27 ENCOUNTER — Inpatient Hospital Stay: Payer: PPO | Admitting: Hematology

## 2022-03-27 VITALS — BP 192/66 | HR 67 | Temp 98.1°F | Resp 17 | Wt 108.7 lb

## 2022-03-27 DIAGNOSIS — Z170 Estrogen receptor positive status [ER+]: Secondary | ICD-10-CM

## 2022-03-27 DIAGNOSIS — C50511 Malignant neoplasm of lower-outer quadrant of right female breast: Secondary | ICD-10-CM | POA: Diagnosis not present

## 2022-03-27 DIAGNOSIS — D696 Thrombocytopenia, unspecified: Secondary | ICD-10-CM

## 2022-03-27 DIAGNOSIS — D500 Iron deficiency anemia secondary to blood loss (chronic): Secondary | ICD-10-CM

## 2022-03-27 NOTE — Patient Instructions (Addendum)
[place] at [facility] Discharge Instructions  You were seen and examined today by Dr. Delton Coombes.  Dr. Delton Coombes discussed your most recent lab work and everything looks good except for your Vitamin B12 is low.   Start taking over the counter Vitamin B12 1000 mcg once daily.  Follow-up as scheduled in 6 months.    Thank you for choosing [place] at [facility] to provide your oncology and hematology care.  To afford each patient quality time with our provider, please arrive at least 15 minutes before your scheduled appointment time.   If you have a lab appointment with the [facility] please come in thru the Main Entrance and check in at the main information desk.  You need to re-schedule your appointment should you arrive 10 or more minutes late.  We strive to give you quality time with our providers, and arriving late affects you and other patients whose appointments are after yours.  Also, if you no show three or more times for appointments you may be dismissed from the clinic at the providers discretion.     Again, thank you for choosing [facility].  Our hope is that these requests will decrease the amount of time that you wait before being seen by our physicians.       _____________________________________________________________  Should you have questions after your visit to [facility], please contact our office at [phone] and follow the prompts.  Our office hours are 8:00 a.m. and 4:30 p.m. Monday - Friday.  Please note that voicemails left after 4:00 p.m. may not be returned until the following business day.  We are closed weekends and major holidays.  You do have access to a nurse 24-7, just call the main number to the clinic [phone] and do not press any options, hold on the line and a nurse will answer the phone.    For prescription refill requests, have your pharmacy contact our office and  allow 72 hours.

## 2022-03-27 NOTE — Progress Notes (Signed)
[facility] [address] [place] [address]   Patient Care Team: [name], DO as PCP - General (Family Medicine) Minus Breeding, MD as PCP - Cardiology (Cardiology) Rolm Bookbinder, MD as Consulting Physician (General Surgery) Eppie Gibson, MD as Attending Physician (Radiation Oncology) Truitt Merle, MD as Consulting Physician (Hematology) Lavonna Monarch, MD as Consulting Physician (Dermatology) Warren Danes, PA-C as Physician Assistant (Dermatology) Elmarie Shiley, MD as Consulting Physician (Nephrology) [facility]  SUMMARY OF ONCOLOGIC HISTORY: Oncology History Overview Note  Cancer Staging Malignant neoplasm of lower-outer quadrant of right breast of female, estrogen receptor positive ([place]) Staging form: Breast, AJCC 8th Edition - Clinical stage from 02/22/2018: Stage IA (cT1c, cN0, cM0, G1, ER+, PR+, HER2-) - Signed by Truitt Merle, MD on 03/02/2018    Malignant neoplasm of lower-outer quadrant of right breast of female, estrogen receptor positive ([place])  02/12/2018 Mammogram   She had routine screening bilateral mammography on 02/12/2018 at [facility] with results showing: indeterminate irregular mass in the right breast.    02/16/2018 Mammogram   She underwent right diagnostic mammography with tomography and right breast ultrasonography at [facility] on 02/16/2018 showing: 1.1 cm irregular mass at the 8 O'clock position on the right breast   02/22/2018 Pathology Results   Diagnosis 1. Breast, right, needle core biopsy, lateral, 8 o'clock - INVASIVE AND IN SITU DUCTAL CARCINOMA. - SEE COMMENT. 2. Breast, right, needle core biopsy, 11 o'clock - FIBROCYSTIC CHANGES. - USUAL DUCTAL HYPERPLASIA. - THERE IS NO EVIDENCE OF MALIGNANCY.   02/22/2018 Receptors her2   IMMUNOHISTOCHEMICAL AND MORPHOMETRIC ANALYSIS PERFORMED MANUALLY Estrogen Receptor: 100%, POSITIVE, STRONG STAINING INTENSITY Progesterone Receptor: 40%, POSITIVE, MODERATE STAINING  INTENSITY Proliferation Marker Ki67: 1% Her2 Negative   02/22/2018 Cancer Staging   Staging form: Breast, AJCC 8th Edition - Clinical stage from 02/22/2018: Stage IA (cT1c, cN0, cM0, G1, ER+, PR+, HER2-) - Signed by Truitt Merle, MD on 03/02/2018    02/25/2018 Initial Diagnosis   Malignant neoplasm of lower-outer quadrant of right breast of female, estrogen receptor positive ([place])     CHIEF COMPLIANT: Follow-up of right breast cancer and pancytopenia   INTERVAL HISTORY: Ms. SANAI FRICK is a 88 y.o. female here today for follow up of her right breast cancer and pancytopenia. Her last visit was on 04/15/2021.   Today she reports feeling good. She continues to take iron tablets twice daily.  She is not taking vitamin B12 tablets.  She does not report any major hot flashes or musculoskeletal symptoms.  REVIEW OF SYSTEMS:   Review of Systems  Constitutional:  Negative for appetite change and fatigue.  All other systems reviewed and are negative.   I have reviewed the past medical history, past surgical history, social history and family history with the patient and they are unchanged from previous note.   ALLERGIES:   is allergic to amlodipine, lisinopril, xeroform occlusive gauze strip [bismuth tribromoph-petrolatum], isosorbide nitrate, sulfonamide derivatives, and terazosin.   MEDICATIONS:  Current Outpatient Medications  Medication Sig Dispense Refill   acetaminophen (TYLENOL) 325 MG tablet Take 325 mg by mouth daily as needed for moderate pain or headache.     atorvastatin (LIPITOR) 20 MG tablet TAKE 1/2 TABLET BY MOUTH EVERY DAY 45 tablet 1   bisoprolol (ZEBETA) 5 MG tablet Take 1 tablet (5 mg total) by mouth daily. 90 tablet 3   cholecalciferol (VITAMIN D) 1000 units tablet Take 1,000 Units by mouth daily.     famotidine (PEPCID)  20 MG tablet Take 1 tablet (20 mg total) by mouth 2 (two) times daily for 14 days. 28 tablet 0   ferrous sulfate 325 (65 FE) MG tablet Take 325 mg by  mouth 2 (two) times daily with a meal.     flecainide (TAMBOCOR) 50 MG tablet Take 1 tablet (50 mg total) by mouth 2 (two) times daily. 180 tablet 3   hydrALAZINE (APRESOLINE) 100 MG tablet Take 100 mg by mouth 2 (two) times daily.     hydroxypropyl methylcellulose / hypromellose (ISOPTO TEARS / GONIOVISC) 2.5 % ophthalmic solution Place 1 drop into both eyes daily.     letrozole (FEMARA) 2.5 MG tablet TAKE 1 TABLET BY MOUTH EVERY DAY 90 tablet 4   levothyroxine (SYNTHROID) 75 MCG tablet TAKE 1 TABLET BY MOUTH EVERY DAY 90 tablet 3   mirtazapine (REMERON) 7.5 MG tablet TAKE 1 TABLET BY MOUTH AT BEDTIME. 90 tablet 1   pantoprazole (PROTONIX) 40 MG tablet Take 1 tablet (40 mg total) by mouth daily. 90 tablet 3   predniSONE (DELTASONE) 20 MG tablet 2 po at sametime daily for 5 days- start tomorrow 10 tablet 0   losartan (COZAAR) 25 MG tablet Take 1 tablet (25 mg total) by mouth daily. 90 tablet 3   No current facility-administered medications for this visit.     PHYSICAL EXAMINATION: Performance status (ECOG): 1 - Symptomatic but completely ambulatory  Vitals:   03/27/22 1419  BP: (!) 192/66  Pulse: 67  Resp: 17  Temp: 98.1 F (36.7 C)  SpO2: 93%   Wt Readings from Last 3 Encounters:  03/27/22 108 lb 11.2 oz (49.3 kg)  03/19/22 108 lb 3.2 oz (49.1 kg)  12/24/21 111 lb 14.4 oz (50.8 kg)   Physical Exam Vitals reviewed.  Constitutional:      Appearance: Normal appearance.  Cardiovascular:     Rate and Rhythm: Normal rate and regular rhythm.     Pulses: Normal pulses.     Heart sounds: Normal heart sounds.  Pulmonary:     Effort: Pulmonary effort is normal.     Breath sounds: Normal breath sounds.  Chest:  Breasts:    Right: Normal. No swelling, bleeding, inverted nipple, mass, nipple discharge, skin change (LOQ lumpectomy scar WNL) or tenderness.     Left: Normal. No swelling, bleeding, inverted nipple, mass, nipple discharge, skin change or tenderness.  Lymphadenopathy:      Upper Body:     Right upper body: No supraclavicular, axillary or pectoral adenopathy.     Left upper body: No supraclavicular, axillary or pectoral adenopathy.  Neurological:     General: No focal deficit present.     Mental Status: She is alert and oriented to person, place, and time.  Psychiatric:        Mood and Affect: Mood normal.        Behavior: Behavior normal.    Breast Exam Chaperone: Thana Ates     LABORATORY DATA:  I have reviewed the data as listed    Latest Ref Rng & Units 12/03/2021    9:12 AM 12/03/2021    8:55 AM 09/25/2021    2:07 PM  CMP  Glucose 70 - 99 mg/dL 105  103  147   BUN 8 - 27 mg/dL 24  24  45   Creatinine 0.57 - 1.00 mg/dL 1.59  1.64  1.67   Sodium 134 - 144 mmol/L 141  141  132   Potassium 3.5 - 5.2 mmol/L 4.3  4.2  4.0   Chloride 96 - 106 mmol/L 99  99  95   CO2 20 - 29 mmol/L _0 Calcium 8.7 - 10.3 mg/dL 9.4  9.4  9.2   Total Protein 6.0 - 8.5 g/dL 6.7   7.2   Total Bilirubin 0.0 - 1.2 mg/dL 0.4   0.5   Alkaline Phos 44 - 121 IU/L 70   50   AST 0 - 40 IU/L 20   21   ALT 0 - 32 IU/L 12   15    No results found for: "KYH062" Lab Results  Component Value Date   WBC 2.3 (L) 03/18/2022   HGB 10.0 (L) 03/18/2022   HCT 30.1 (L) 03/18/2022   MCV 88.3 03/18/2022   PLT 109 (L) 03/18/2022   NEUTROABS 1.4 (L) 03/18/2022    ASSESSMENT:  1.  Stage I right breast cancer: - Right lumpectomy on 04/08/2018, IDC, 1.1 cm, grade 1, margins negative, ER/PR positive, HER2 negative, Ki-67 1%. - Letrozole started on 05/28/2018.  She declined adjuvant radiation therapy.   PLAN:  1.  Stage I right breast cancer: - We have reviewed mammogram from 03/21/2022, BI-RADS Category 2. - Continue letrozole, which she is tolerating well. - RTC 6 months for follow-up.  2.  Normocytic anemia: - Anemia from CKD, relative iron deficiency and B12 deficiency. - She is taking iron tablet twice daily.  Hemoglobin is 10.  B12 is normal but MMA was elevated at  571.  Ferritin is 426 and percent saturation 20. - Recommend starting vitamin B12 1 mg tablet daily. - If hemoglobin drops below 10 despite adequate levels of ferritin and B12, will consider initiating ESA's.  Breast Cancer therapy associated bone loss: I have recommended calcium, Vitamin D and weight bearing exercises.  Orders placed this encounter:  No orders of the defined types were placed in this encounter.   The patient has a good understanding of the overall plan. She agrees with it. She will call with any problems that may develop before the next visit here.  Derek Jack, MD [place] [phone]

## 2022-04-10 ENCOUNTER — Ambulatory Visit: Payer: PPO | Admitting: Physician Assistant

## 2022-04-29 ENCOUNTER — Ambulatory Visit (INDEPENDENT_AMBULATORY_CARE_PROVIDER_SITE_OTHER): Payer: PPO

## 2022-04-29 DIAGNOSIS — Z0000 Encounter for general adult medical examination without abnormal findings: Secondary | ICD-10-CM | POA: Diagnosis not present

## 2022-04-29 NOTE — Progress Notes (Signed)
MEDICARE ANNUAL WELLNESS VISIT  04/29/2022  Telephone Visit Disclaimer This Medicare AWV was conducted by telephone due to national recommendations for restrictions regarding the COVID-19 Pandemic (e.g. social distancing).  I verified, using two identifiers, that I am speaking with [name] or their authorized healthcare agent. I discussed the limitations, risks, security, and privacy concerns of performing an evaluation and management service by telephone and the potential availability of an in-person appointment in the future. The patient expressed understanding and agreed to proceed.  Location of Patient: Home Location of Provider (nurse):  [facility]  Subjective:    [name] is a 88 y.o. female patient of [name], DO who had a Medicare Annual Wellness Visit today via telephone. [name] is Retired and lives alone. She has two children. She reports that she is socially active and does interact with friends/family regularly. She is minimally physically active and enjoys working in her yard and garden.  Patient Care Team: [name], DO as PCP - General (Family Medicine) Minus Breeding, MD as PCP - Cardiology (Cardiology) Rolm Bookbinder, MD as Consulting Physician (General Surgery) Eppie Gibson, MD as Attending Physician (Radiation Oncology) Truitt Merle, MD as Consulting Physician (Hematology) Lavonna Monarch, MD as Consulting Physician (Dermatology) Starlyn Skeans as Physician Assistant (Dermatology) Elmarie Shiley, MD as Consulting Physician (Nephrology) [facility]     04/29/2022    9:01 AM 03/27/2022    2:18 PM 12/24/2021    9:23 AM 09/25/2021    2:42 PM 08/31/2021    3:00 PM 08/01/2021    9:00 PM 08/01/2021    8:57 PM  Advanced Directives  Does Patient Have a Medical Advance Directive? Yes No Yes Yes No  Yes  Type of Advance Directive Living will;Healthcare Power of [place];Living will  [place];Living will   Living will  Does patient want to make changes to medical advance directive? No - Patient declined   No - Patient declined  No - Guardian declined   Copy of [place] in Chart? No - copy requested  No - copy requested No - copy requested     Would patient like information on creating a medical advance directive?  No - Patient declined   No - Patient declined      Hospital Utilization Over the Past 12 Months: # of hospitalizations or ER visits: 2 # of surgeries: 0  Review of Systems    Patient reports that her overall health is unchanged compared to last year.  History obtained from chart review and the patient  Patient Reported Readings (BP, Pulse, CBG, Weight, etc) none  Pain Assessment  Patient denies pain   Current Medications & Allergies (verified) Allergies as of 04/29/2022       Reactions   Amlodipine Other (See Comments)   edema   Lisinopril    cough   Xeroform Occlusive Gauze Strip [bismuth Tribromoph-petrolatum]    Isosorbide Nitrate Nausea Only   Sulfonamide Derivatives Nausea Only, Rash   Terazosin Hives, Nausea Only        Medication List        Accurate as of April 29, 2022  9:06 AM. If you have any questions, ask your nurse or doctor.          STOP taking these medications    famotidine 20 MG tablet Commonly known as: PEPCID   predniSONE 20 MG tablet Commonly known as: DELTASONE  TAKE these medications    acetaminophen 325 MG tablet Commonly known as: TYLENOL Take 325 mg by mouth daily as needed for moderate pain or headache.   atorvastatin 20 MG tablet Commonly known as: LIPITOR TAKE 1/2 TABLET BY MOUTH EVERY DAY   bisoprolol 5 MG tablet Commonly known as: ZEBETA Take 1 tablet (5 mg total) by mouth daily.   cholecalciferol 1000 units tablet Commonly known as: VITAMIN D Take 1,000 Units by mouth daily.   ferrous sulfate 325 (65 FE) MG tablet Take 325 mg by  mouth 2 (two) times daily with a meal.   flecainide 50 MG tablet Commonly known as: TAMBOCOR Take 1 tablet (50 mg total) by mouth 2 (two) times daily.   hydrALAZINE 100 MG tablet Commonly known as: APRESOLINE Take 100 mg by mouth 2 (two) times daily.   hydroxypropyl methylcellulose / hypromellose 2.5 % ophthalmic solution Commonly known as: ISOPTO TEARS / GONIOVISC Place 1 drop into both eyes daily.   letrozole 2.5 MG tablet Commonly known as: FEMARA TAKE 1 TABLET BY MOUTH EVERY DAY   levothyroxine 75 MCG tablet Commonly known as: SYNTHROID TAKE 1 TABLET BY MOUTH EVERY DAY   losartan 25 MG tablet Commonly known as: COZAAR Take 1 tablet (25 mg total) by mouth daily.   mirtazapine 7.5 MG tablet Commonly known as: REMERON TAKE 1 TABLET BY MOUTH AT BEDTIME.   pantoprazole 40 MG tablet Commonly known as: Protonix Take 1 tablet (40 mg total) by mouth daily.        History (reviewed): Past Medical History:  Diagnosis Date   Anemia    Arthritis    Asthma    Atrial fibrillation ([place])    Basal cell carcinoma 02/06/2009   Left ear targus- ([facility])   Basal cell carcinoma 09/28/2008   Right back-([id number])   CKD (chronic kidney disease), stage III (HCC)    Heart murmur    Hyperlipidemia    Hypertension    Hypothyroidism    Nodule of right lung    Right upper lobe   PONV (postoperative nausea and vomiting)    Renal insufficiency    Chronic   Renal vascular disease    Right renal artery stenosis ([place]) 05/09/2015   Squamous cell carcinoma of skin 09/07/2019   KA-Left shin-txpbx   Vertigo    Past Surgical History:  Procedure Laterality Date   BIOPSY  04/09/2021   Procedure: BIOPSY;  Surgeon: Harvel Quale, MD;  [facility];  Service: Gastroenterology;;   BREAST LUMPECTOMY WITH RADIOACTIVE SEED LOCALIZATION Right 04/08/2018   Procedure: BREAST LUMPECTOMY WITH RADIOACTIVE SEED LOCALIZATION;  Surgeon: Rolm Bookbinder, MD;  Location: [facility];   Service: General;  Laterality: Right;   COLONOSCOPY  2016   diverticulosis, hemorrhoids   ESOPHAGOGASTRODUODENOSCOPY  2016   normal   ESOPHAGOGASTRODUODENOSCOPY (EGD) WITH PROPOFOL N/A 04/09/2021   Procedure: ESOPHAGOGASTRODUODENOSCOPY (EGD) WITH PROPOFOL;  Surgeon: Harvel Quale, MD;  [facility];  Service: Gastroenterology;  Laterality: N/A;  12:45   ESOPHAGOGASTRODUODENOSCOPY (EGD) WITH PROPOFOL N/A 09/01/2021   Procedure: ESOPHAGOGASTRODUODENOSCOPY (EGD) WITH PROPOFOL;  Surgeon: Eloise Harman, DO;  [facility];  Service: Endoscopy;  Laterality: N/A;   IR GENERIC HISTORICAL  08/29/2016   IR US GUIDE VASC ACCESS RIGHT 08/29/2016 [facility]   IR GENERIC HISTORICAL  08/29/2016   IR RENAL BILAT S&I MOD SED 08/29/2016 [facility]   KNEE ARTHROSCOPY Right    2019   THYROIDECTOMY, PARTIAL Right 1981   middle  lobe removed 1st, then right lobe removed 7-8 years later (approx. 79)   Family History  Problem Relation Age of Onset   Thyroid disease Mother    Stroke Father    Hypertension Brother    Lung cancer Maternal Uncle    Cancer Maternal Grandmother        Liver   Lung cancer Maternal Uncle    Hypertension Daughter    Hypercholesterolemia Daughter    Hypercholesterolemia Daughter    Social History   Socioeconomic History   Marital status: Widowed    Spouse name: Not on file   Number of children: 2   Years of education: 4   Highest education level: Not on file  Occupational History   Occupation: Retired  Tobacco Use   Smoking status: Never   Smokeless tobacco: Never  Vaping Use   Vaping Use: Never used  Substance and Sexual Activity   Alcohol use: No   Drug use: No   Sexual activity: Never  Other Topics Concern   Not on file  Social History Narrative   Lives alone, but her daughter stays there at night   Ambidextrous (writes with left hand).   No caffeine use.   Social Determinants of Health   Financial Resource Strain:  Low Risk  (04/26/2021)   Overall Financial Resource Strain (CARDIA)    Difficulty of Paying Living Expenses: Not hard at all  Food Insecurity: No Food Insecurity (04/26/2021)   Hunger Vital Sign    Worried About Running Out of Food in the Last Year: Never true    Ran Out of Food in the Last Year: Never true  Transportation Needs: No Transportation Needs (04/26/2021)   PRAPARE - Hydrologist (Medical): No    Lack of Transportation (Non-Medical): No  Physical Activity: Inactive (04/26/2021)   Exercise Vital Sign    Days of Exercise per Week: 0 days    Minutes of Exercise per Session: 0 min  Stress: No Stress Concern Present (04/26/2021)   [place]    Feeling of Stress : Not at all  Social Connections: Moderately Isolated (04/26/2021)   Social Connection and Isolation Panel [NHANES]    Frequency of Communication with Friends and Family: More than three times a week    Frequency of Social Gatherings with Friends and Family: More than three times a week    Attends Religious Services: More than 4 times per year    Active Member of [employer] or Organizations: No    Attends Archivist Meetings: Never    Marital Status: Widowed    Activities of Daily Living    04/29/2022    9:02 AM 09/01/2021    4:00 AM  In your present state of health, do you have any difficulty performing the following activities:  Hearing? 1 0  Vision? 0 0  Difficulty concentrating or making decisions? 0 0  Walking or climbing stairs? 0 0  Dressing or bathing? 0 0  Doing errands, shopping? 0 0  Preparing Food and eating ? N   Using the Toilet? N   In the past six months, have you accidently leaked urine? N   Do you have problems with loss of bowel control? N   Managing your Medications? N   Managing your Finances? N   Housekeeping or managing your Housekeeping? N   Patient reports difficulty in hearing.  She does have a  hearing aid but she reports  she does not wear it regularly.  Patient Education/ Literacy How often do you need to have someone help you when you read instructions, pamphlets, or other written materials from your doctor or pharmacy?: 1 - Never  Exercise Current Exercise Habits: Structured exercise class, Type of exercise: stretching;strength training/weights, Time (Minutes): 45, Frequency (Times/Week): 2, Weekly Exercise (Minutes/Week): 90, Intensity: Mild, Exercise limited by: None identified  Diet Patient reports consuming 3 meals a day and 1 snack(s) a day Patient reports that her primary diet is: Regular Patient reports that she does have regular access to food.   Depression Screen    03/19/2022    8:39 AM 12/10/2021    9:39 AM 09/11/2021   11:39 AM 07/01/2021   10:01 AM 05/30/2021    9:03 AM 05/24/2021    2:23 PM 05/20/2021    1:46 PM  PHQ 2/9 Scores  PHQ - 2 Score 0 0 0 0 0 0 0     Fall Risk    04/29/2022    9:05 AM 03/19/2022    8:21 AM 12/10/2021    9:39 AM 09/11/2021   11:39 AM 07/01/2021   10:01 AM  [place] in the past year? 0 0 0 0 0  Follow up Falls evaluation completed         Objective:  [name] seemed alert and oriented and she participated appropriately during our telephone visit.  Blood Pressure Weight BMI  BP Readings from Last 3 Encounters:  03/27/22 (!) 192/66  03/19/22 (!) 158/79  12/24/21 (!) 174/53   Wt Readings from Last 3 Encounters:  03/27/22 108 lb 11.2 oz (49.3 kg)  03/19/22 108 lb 3.2 oz (49.1 kg)  12/24/21 111 lb 14.4 oz (50.8 kg)   BMI Readings from Last 1 Encounters:  03/27/22 17.54 kg/m    *Unable to obtain current vital signs, weight, and BMI due to telephone visit type  Hearing/Vision  [name] did not seem to have difficulty with hearing/understanding during the telephone conversation Reports that she has had a formal eye exam by an eye care professional within the past year Reports that she has had a formal hearing  evaluation within the past year *Unable to fully assess hearing and vision during telephone visit type  Cognitive Function:    04/29/2022    9:05 AM 04/29/2022    9:04 AM 04/26/2021   10:36 AM  6CIT Screen  What Year? 0 points 0 points 0 points  What month? 0 points 0 points 0 points  What time? 0 points 0 points 0 points  Count back from 20 0 points  0 points  Months in reverse 0 points  0 points  Repeat phrase 0 points  2 points  Total Score 0 points  2 points   (Normal:0-7, Significant for Dysfunction: >8)  Normal Cognitive Function Screening: Yes   Immunization & Health Maintenance Record Immunization History  Administered Date(s) Administered   Fluad Quad(high Dose 65+) 06/13/2019, 06/01/2020, 06/12/2021   Influenza, High Dose Seasonal PF 06/01/2017, 06/04/2018   Influenza,inj,Quad PF,6+ Mos 05/31/2013, 05/30/2015, 06/10/2016   Moderna Sars-Covid-2 Vaccination 09/14/2019, 10/12/2019, 06/12/2020   Pneumococcal Conjugate-13 09/07/2013   Pneumococcal Polysaccharide-23 06/10/2016   Td 10/10/2010, 08/03/2017   Tdap 10/10/2010   Zoster Recombinat (Shingrix) 10/17/2020, 02/27/2021   Zoster, Live 05/20/2010    Health Maintenance  Topic Date Due   COVID-19 Vaccine (4 - Moderna risk series) 08/07/2020   INFLUENZA VACCINE  11/16/2022 (Originally 03/18/2022)   DEXA SCAN  04/27/2023   MAMMOGRAM  03/21/2024   TETANUS/TDAP  08/04/2027   Pneumonia Vaccine 12+ Years old  Completed   Zoster Vaccines- Shingrix  Completed   HPV VACCINES  Aged Out       Assessment  This is a routine wellness examination for [name].  Health Maintenance: Due or Overdue Health Maintenance Due  Topic Date Due   COVID-19 Vaccine (4 - Moderna risk series) 08/07/2020    [name] does not need a referral for Community Assistance: Care Management:   no Social Work:    no Prescription Assistance:  no Nutrition/Diabetes Education:  no   Plan:  Personalized Goals  Goals Addressed              This Visit's Progress    Patient Stated       04/29/2022 AWV Goal: Fall Prevention  Over the next year, patient will decrease their risk for falls by: Using assistive devices, such as a cane or walker, as needed Identifying fall risks within their home and correcting them by: Removing throw rugs Adding handrails to stairs or ramps Removing clutter and keeping a clear pathway throughout the home Increasing light, especially at night Adding shower handles/bars Raising toilet seat Identifying potential personal risk factors for falls: Medication side effects Incontinence/urgency Vestibular dysfunction Hearing loss Musculoskeletal disorders Neurological disorders Orthostatic hypotension         Personalized Health Maintenance & Screening Recommendations  Patient is up to date  Lung Cancer Screening Recommended: no (Low Dose CT Chest recommended if Age 51-80 years, 30 pack-year currently smoking OR have quit w/in past 15 years) Hepatitis C Screening recommended: no HIV Screening recommended: no  Advanced Directives: Written information was not prepared per patient's request.  Referrals & Orders No orders of the defined types were placed in this encounter.   Follow-up Plan Follow-up with [name], DO as planned   I have personally reviewed and noted the following in the patient's chart:   Medical and social history Use of alcohol, tobacco or illicit drugs  Current medications and supplements Functional ability and status Nutritional status Physical activity Advanced directives List of other physicians Hospitalizations, surgeries, and ER visits in previous 12 months Vitals Screenings to include cognitive, depression, and falls Referrals and appointments  In addition, I have reviewed and discussed with [name] certain preventive protocols, quality metrics, and best practice recommendations. A written personalized care plan for preventive  services as well as general preventive health recommendations is available and can be mailed to the patient at her request.      Burnadette Pop  04/29/2022  Patient declined after visit summary

## 2022-05-13 DIAGNOSIS — Z515 Encounter for palliative care: Secondary | ICD-10-CM | POA: Diagnosis not present

## 2022-05-13 DIAGNOSIS — I10 Essential (primary) hypertension: Secondary | ICD-10-CM | POA: Diagnosis not present

## 2022-05-13 DIAGNOSIS — Z7722 Contact with and (suspected) exposure to environmental tobacco smoke (acute) (chronic): Secondary | ICD-10-CM | POA: Diagnosis not present

## 2022-06-02 IMAGING — DX DG CHEST 2V
2 series · 2 of 2 positions shown · non-contrast
Comparison: 08/03/2021

CLINICAL DATA: Nonproductive cough, wheezing, shortness of breath
for several days.

EXAM:
CHEST - 2 VIEW

[chest lat]
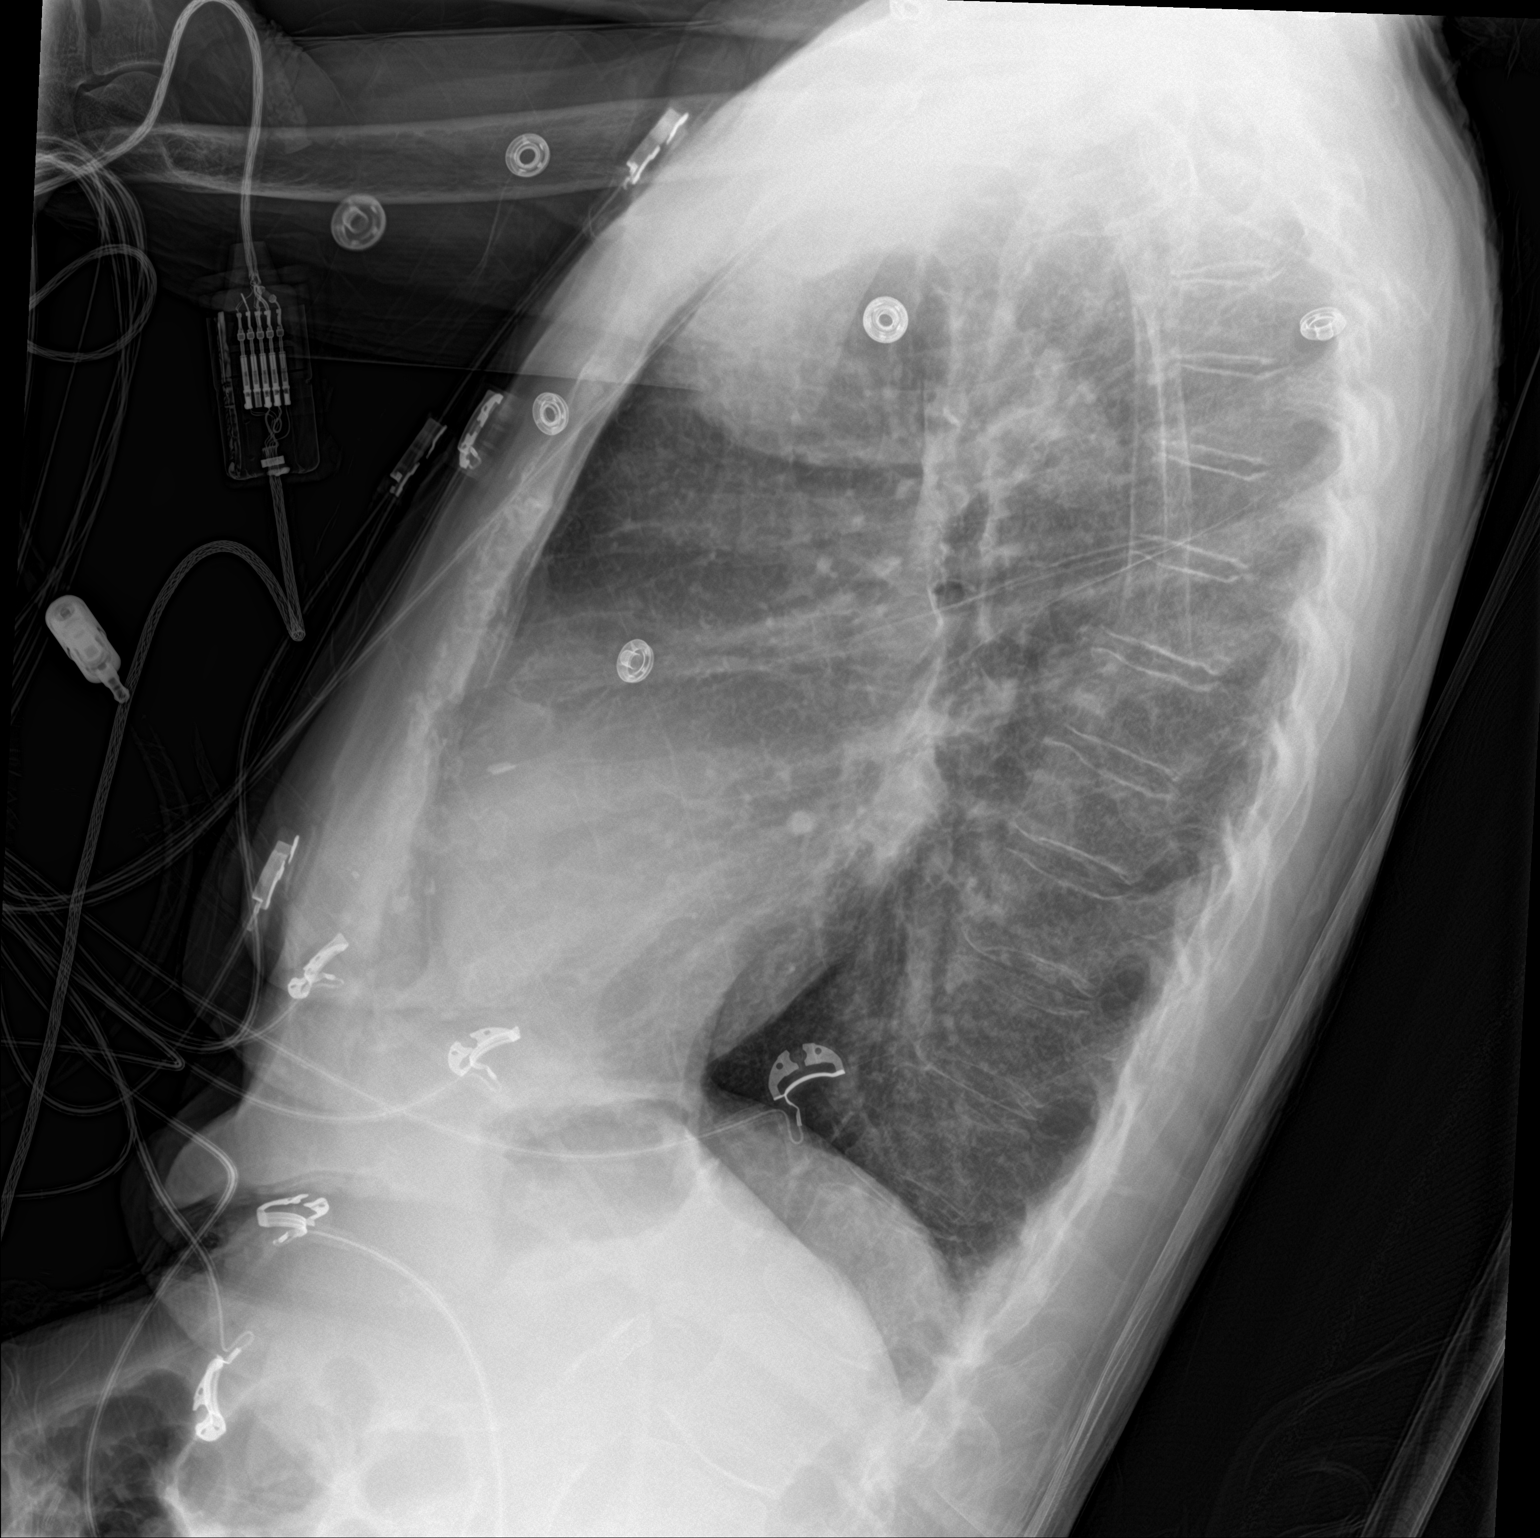

[chest ap]
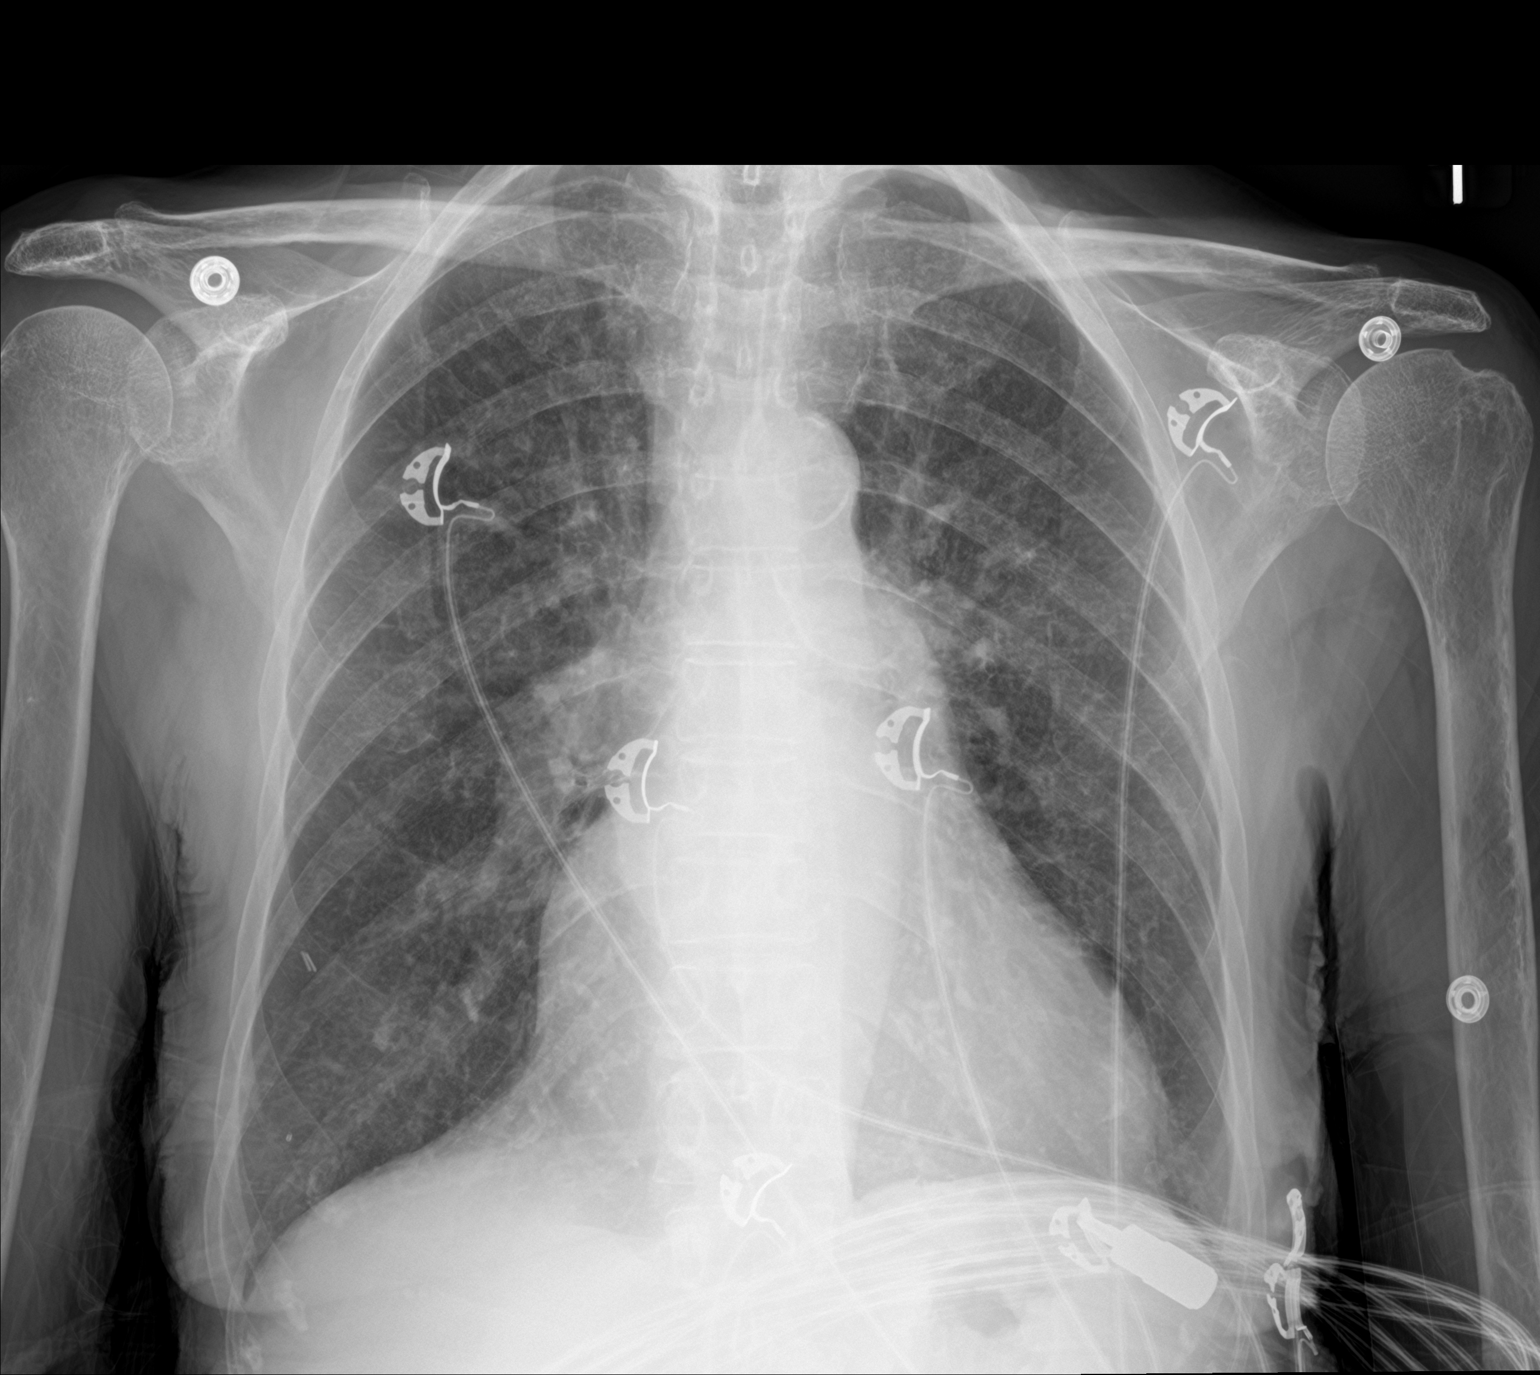

[2 of 2 positions shown; findings below may reference images not displayed]

FINDINGS: Stable cardiomegaly. Aortic atherosclerotic calcification noted.
Stable pulmonary hyperinflation and chronic interstitial prominence,
consistent with COPD. No evidence of pulmonary infiltrate or edema.
No evidence of pleural effusion.
IMPRESSION: Stable cardiomegaly and COPD. No active lung disease.

## 2022-06-04 ENCOUNTER — Telehealth: Payer: Self-pay | Admitting: Family Medicine

## 2022-06-04 NOTE — Telephone Encounter (Signed)
Lipid, tsh, ft4. She has several labs in for Dr Delton Coombes as well.

## 2022-06-05 ENCOUNTER — Other Ambulatory Visit: Payer: Self-pay

## 2022-06-05 ENCOUNTER — Other Ambulatory Visit: Payer: PPO

## 2022-06-05 DIAGNOSIS — E034 Atrophy of thyroid (acquired): Secondary | ICD-10-CM | POA: Diagnosis not present

## 2022-06-05 DIAGNOSIS — I5032 Chronic diastolic (congestive) heart failure: Secondary | ICD-10-CM

## 2022-06-06 LAB — LIPID PANEL
Chol/HDL Ratio: 2.4 ratio (ref 0.0–4.4)
Cholesterol, Total: 127 mg/dL (ref 100–199)
HDL: 53 mg/dL (ref >39)
LDL Chol Calc (NIH): 61 mg/dL (ref 0–99)
Triglycerides: 64 mg/dL (ref 0–149)
VLDL Cholesterol Cal: 13 mg/dL (ref 5–40)

## 2022-06-06 LAB — T4, FREE: Free T4: 1.89 ng/dL — ABNORMAL HIGH (ref 0.82–1.77)

## 2022-06-06 LAB — TSH: TSH: 2.84 u[IU]/mL (ref 0.450–4.500)

## 2022-06-09 ENCOUNTER — Ambulatory Visit (INDEPENDENT_AMBULATORY_CARE_PROVIDER_SITE_OTHER): Payer: PPO | Admitting: Family Medicine

## 2022-06-09 ENCOUNTER — Telehealth: Payer: Self-pay | Admitting: Family Medicine

## 2022-06-09 ENCOUNTER — Encounter: Payer: Self-pay | Admitting: Family Medicine

## 2022-06-09 VITALS — BP 173/62 | HR 59 | Temp 97.6°F | Ht 66.0 in | Wt 111.2 lb

## 2022-06-09 DIAGNOSIS — E034 Atrophy of thyroid (acquired): Secondary | ICD-10-CM | POA: Diagnosis not present

## 2022-06-09 DIAGNOSIS — R636 Underweight: Secondary | ICD-10-CM | POA: Diagnosis not present

## 2022-06-09 DIAGNOSIS — Z23 Encounter for immunization: Secondary | ICD-10-CM

## 2022-06-09 DIAGNOSIS — N1832 Chronic kidney disease, stage 3b: Secondary | ICD-10-CM | POA: Diagnosis not present

## 2022-06-09 DIAGNOSIS — L570 Actinic keratosis: Secondary | ICD-10-CM

## 2022-06-09 NOTE — Telephone Encounter (Signed)
DONE

## 2022-06-09 NOTE — Progress Notes (Signed)
Subjective: FX:[id number] PCP: [name], DO EQA:STMHD B Mims is a 88 y.o. female presenting to clinic today for:  1. Hypothyroidism Patient is compliant with thyroid replacement.  No reports of tremor, heart palpitations, changes in bowel habits.  Infection reports her energy has been somewhat low.  Denies any shortness of breath or easy fatigability outside of her baseline however.  She certainly does not feel like she did when she had that GI bleed last December.  She is interested to see what her hemoglobin looks like.  She had checked in August and it was 10.0, essentially unchanged from the previous 2 months checkup.  Compliant with iron as directed and has a follow-up visit with her specialist in February.  2.  Skin lesion Right lower leg with a lesion of concern.  She reports that it has some scaling that crusts over and she scratches it off.  No overt pain.   ROS: Per HPI  Allergies  Allergen Reactions   Amlodipine Other (See Comments)    edema   Lisinopril     cough   Xeroform Occlusive Gauze Strip [Bismuth Tribromoph-Petrolatum]    Isosorbide Nitrate Nausea Only   Sulfonamide Derivatives Nausea Only and Rash   Terazosin Hives and Nausea Only   Past Medical History:  Diagnosis Date   Anemia    Arthritis    Asthma    Atrial fibrillation ([place])    Basal cell carcinoma 02/06/2009   Left ear targus- ([facility])   Basal cell carcinoma 09/28/2008   Right back-(CX35FU)   CKD (chronic kidney disease), stage III (HCC)    Heart murmur    Hyperlipidemia    Hypertension    Hypothyroidism    Nodule of right lung    Right upper lobe   PONV (postoperative nausea and vomiting)    Renal insufficiency    Chronic   Renal vascular disease    Right renal artery stenosis (HCC) 05/09/2015   Squamous cell carcinoma of skin 09/07/2019   KA-Left shin-txpbx   Vertigo     Current Outpatient Medications:    acetaminophen (TYLENOL) 325 MG tablet, Take 325 mg by mouth  daily as needed for moderate pain or headache., Disp: , Rfl:    atorvastatin (LIPITOR) 20 MG tablet, TAKE 1/2 TABLET BY MOUTH EVERY DAY, Disp: 45 tablet, Rfl: 1   bisoprolol (ZEBETA) 5 MG tablet, Take 1 tablet (5 mg total) by mouth daily., Disp: 90 tablet, Rfl: 3   cholecalciferol (VITAMIN D) 1000 units tablet, Take 1,000 Units by mouth daily., Disp: , Rfl:    ferrous sulfate 325 (65 FE) MG tablet, Take 325 mg by mouth 2 (two) times daily with a meal., Disp: , Rfl:    flecainide (TAMBOCOR) 50 MG tablet, Take 1 tablet (50 mg total) by mouth 2 (two) times daily., Disp: 180 tablet, Rfl: 3   hydrALAZINE (APRESOLINE) 100 MG tablet, Take 100 mg by mouth 2 (two) times daily., Disp: , Rfl:    hydroxypropyl methylcellulose / hypromellose (ISOPTO TEARS / GONIOVISC) 2.5 % ophthalmic solution, Place 1 drop into both eyes daily., Disp: , Rfl:    letrozole (FEMARA) 2.5 MG tablet, TAKE 1 TABLET BY MOUTH EVERY DAY, Disp: 90 tablet, Rfl: 4   levothyroxine (SYNTHROID) 75 MCG tablet, TAKE 1 TABLET BY MOUTH EVERY DAY, Disp: 90 tablet, Rfl: 3   mirtazapine (REMERON) 7.5 MG tablet, TAKE 1 TABLET BY MOUTH AT BEDTIME., Disp: 90 tablet, Rfl: 1   pantoprazole (PROTONIX) 40 MG tablet, Take 1  tablet (40 mg total) by mouth daily., Disp: 90 tablet, Rfl: 3   losartan (COZAAR) 25 MG tablet, Take 1 tablet (25 mg total) by mouth daily., Disp: 90 tablet, Rfl: 3 Social History   Socioeconomic History   Marital status: Widowed    Spouse name: Not on file   Number of children: 2   Years of education: 68   Highest education level: Not on file  Occupational History   Occupation: Retired  Tobacco Use   Smoking status: Never   Smokeless tobacco: Never  Vaping Use   Vaping Use: Never used  Substance and Sexual Activity   Alcohol use: No   Drug use: No   Sexual activity: Never  Other Topics Concern   Not on file  Social History Narrative   Lives alone, but her daughter stays there at night   Ambidextrous (writes with left  hand).   No caffeine use.   Social Determinants of Health   Financial Resource Strain: Low Risk  (04/26/2021)   Overall Financial Resource Strain (CARDIA)    Difficulty of Paying Living Expenses: Not hard at all  Food Insecurity: No Food Insecurity (04/26/2021)   Hunger Vital Sign    Worried About Running Out of Food in the Last Year: Never true    Ran Out of Food in the Last Year: Never true  Transportation Needs: No Transportation Needs (04/26/2021)   PRAPARE - Hydrologist (Medical): No    Lack of Transportation (Non-Medical): No  Physical Activity: Inactive (04/26/2021)   Exercise Vital Sign    Days of Exercise per Week: 0 days    Minutes of Exercise per Session: 0 min  Stress: No Stress Concern Present (04/26/2021)   [place]    Feeling of Stress : Not at all  Social Connections: Moderately Isolated (04/26/2021)   Social Connection and Isolation Panel [NHANES]    Frequency of Communication with Friends and Family: More than three times a week    Frequency of Social Gatherings with Friends and Family: More than three times a week    Attends Religious Services: More than 4 times per year    Active Member of [employer] or Organizations: No    Attends Archivist Meetings: Never    Marital Status: Widowed  Intimate Partner Violence: Not At Risk (04/26/2021)   Humiliation, Afraid, Rape, and Kick questionnaire    Fear of Current or Ex-Partner: No    Emotionally Abused: No    Physically Abused: No    Sexually Abused: No   Family History  Problem Relation Age of Onset   Thyroid disease Mother    Stroke Father    Hypertension Brother    Lung cancer Maternal Uncle    Cancer Maternal Grandmother        Liver   Lung cancer Maternal Uncle    Hypertension Daughter    Hypercholesterolemia Daughter    Hypercholesterolemia Daughter     Objective: Office vital signs reviewed. BP (!) 172/62    Pulse (!) 59   Temp 97.6 F (36.4 C)   Ht '5\' 6"'$  (1.676 m)   Wt 111 lb 3.2 oz (50.4 kg)   SpO2 92%   BMI 17.95 kg/m   Physical Examination:  General: Awake, alert, thin elderly female, No acute distress HEENT: Sclera white.  Moist mucous membranes.  No exophthalmos Cardio: regular rate and rhythm, S1S2 heard, 2/6 SEM appreciated both right and left  sternal border.  This radiates to carotids bilaterally Pulm: clear to auscultation bilaterally, no wheezes, rhonchi or rales; normal work of breathing on room air Neuro: No tremor Skin: BB sized nodular lesion with a lateral punctum with hyperkeratosis protruding.  Nontender, nonfluctuant and nonindurated   Cryotherapy Procedure:  Risks and benefits of procedure were reviewed with the patient.  Verbal consent obtained obtained. Lesion of concern was identified and located on right lower leg.  Liquid nitrogen was applied to area of concern and extending out 1 millimeters beyond the border of the lesion.  Treated area was allowed to come back to room temperature before treating it a second time.  Patient tolerated procedure well and there were no immediate complications.  Home care instructions were reviewed with the patient and a handout was provided.   Assessment/ Plan: 88 y.o. female   Hypothyroidism due to acquired atrophy of thyroid - Plan: TSH, T4, free  Need for immunization against influenza - Plan: Flu Vaccine QUAD High Dose(Fluad)  Stage 3b chronic kidney disease ([place]) - Plan: Renal Function Panel, CBC  Underweight  Actinic keratosis  TSH was normal but free T4 slightly elevated.  She does not have any concerning symptoms so we will keep medications as are prescribed.  Plan for repeat TSH and free T4 prior to next visit in 4 weeks  Influenza vaccination administered  BP is not at goal.  Advised to monitor blood pressure closely at home as this is likely whitecoat hypertension with a history of essential hypertension.  She  will bring in her blood pressure meter to next visit and bring in log.  Plan for CBC and renal function panel with next lab draw as well.  No active bleeding symptoms or signs.  History of anemia secondary to GI bleed last December.  Under the care of of oncology/hematology for laboratory checkup and iron management with next lab draw not expect until February.  Suspected actinic keratosis along the right lower limb treated with cryoablation today.  Discussed that this could be infected hyperkeratosis from a plugging in the setting of cystic formation as the lesion was somewhat nodular under the skin.  If no resolution, plan for referral to dermatology  Orders Placed This Encounter  Procedures   Flu Vaccine QUAD High Dose(Fluad)   No orders of the defined types were placed in this encounter.    [name], DO [place] [phone]

## 2022-06-09 NOTE — Telephone Encounter (Signed)
Pt called to let nurse know to take off future order for iron and Hgb. Says she will wait until her appt in feb with specialist to have it rechecked that way she knows insurance will pay for it.

## 2022-06-12 ENCOUNTER — Ambulatory Visit: Payer: PPO | Admitting: Physician Assistant

## 2022-06-13 ENCOUNTER — Ambulatory Visit (INDEPENDENT_AMBULATORY_CARE_PROVIDER_SITE_OTHER): Payer: PPO | Admitting: Family Medicine

## 2022-06-13 ENCOUNTER — Encounter: Payer: Self-pay | Admitting: Family Medicine

## 2022-06-13 VITALS — BP 130/58 | HR 67 | Temp 98.4°F | Ht 66.0 in | Wt 111.4 lb

## 2022-06-13 DIAGNOSIS — J069 Acute upper respiratory infection, unspecified: Secondary | ICD-10-CM

## 2022-06-13 MED ORDER — LEVOCETIRIZINE DIHYDROCHLORIDE 5 MG PO TABS: 5.0000 mg | ORAL_TABLET | Freq: Every day | ORAL | 0 refills | Status: DC | PRN

## 2022-06-13 NOTE — Patient Instructions (Signed)

## 2022-06-13 NOTE — Progress Notes (Signed)
Acute Office Visit  Subjective:     Patient ID: [name], female    DOB: [birth year], 88 y.o.   MRN: [id number]  Chief Complaint  Patient presents with   Fatigue    HPI Patient is in today for fatigue for 2 days. This started the day after getting her flu shot. She also has had a decreased appetite, non productive cough, and itchy throat. She feels better today. She had a chicken leg and mashed potatoes today. She has been staying well hydrated. She has chronic shortness of breath. She reports that she is no worse than usual for her. She denies chest pain, wheezing, fever, chills, myalgias, nausea, vomiting, diarrhea, or edema. She had a negative home Covid test. She has not tried any remedies.   ROS As per HPI.      Objective:    BP (!) 130/58 Comment: at home reading per pt  Pulse 67   Temp 98.4 F (36.9 C) (Temporal)   Ht '5\' 6"'$  (1.676 m)   Wt 111 lb 6 oz (50.5 kg)   SpO2 91%   BMI 17.98 kg/m  SpO2 Readings from Last 3 Encounters:  06/13/22 91%  06/09/22 92%  03/27/22 93%      Physical Exam Vitals and nursing note reviewed.  Constitutional:      General: She is not in acute distress.    Appearance: She is not ill-appearing, toxic-appearing or diaphoretic.  HENT:     Head: Normocephalic and atraumatic.     Right Ear: Tympanic membrane, ear canal and external ear normal.     Left Ear: Tympanic membrane, ear canal and external ear normal.     Nose: Nose normal.     Mouth/Throat:     Mouth: Mucous membranes are moist.     Pharynx: Oropharynx is clear. No oropharyngeal exudate or posterior oropharyngeal erythema.  Eyes:     General:        Right eye: No discharge.        Left eye: No discharge.     Conjunctiva/sclera: Conjunctivae normal.  Cardiovascular:     Rate and Rhythm: Normal rate and regular rhythm.     Heart sounds: Murmur heard.     Systolic murmur is present with a grade of 2/6.  Pulmonary:     Effort: Pulmonary effort is normal. No  respiratory distress.     Breath sounds: Normal breath sounds. No wheezing, rhonchi or rales.  Musculoskeletal:     Cervical back: Neck supple.     Right lower leg: No edema.     Left lower leg: No edema.  Lymphadenopathy:     Cervical: No cervical adenopathy.  Skin:    General: Skin is warm and dry.  Neurological:     Mental Status: She is alert and oriented to person, place, and time. Mental status is at baseline.  Psychiatric:        Mood and Affect: Mood normal.        Behavior: Behavior normal.     No results found for any visits on 06/13/22.      Assessment & Plan:   Arlo was seen today for fatigue.  Diagnoses and all orders for this visit:  Viral URI with cough Improving symptoms. Lungs clear on exam. Discussed symptomatic care and return precautions.  -     levocetirizine (XYZAL) 5 MG tablet; Take 1 tablet (5 mg total) by mouth daily as needed for allergies (rhinitis).  The patient indicates understanding  of these issues and agrees with the plan.  Gwenlyn Perking, FNP

## 2022-06-16 ENCOUNTER — Inpatient Hospital Stay (HOSPITAL_COMMUNITY)
Admission: EM | Admit: 2022-06-16 | Discharge: 2022-06-19 | DRG: 193 | Disposition: A | Discharge status: Home Health | Payer: PPO | Attending: Family Medicine | Admitting: Family Medicine

## 2022-06-16 ENCOUNTER — Other Ambulatory Visit: Payer: Self-pay

## 2022-06-16 ENCOUNTER — Encounter (HOSPITAL_COMMUNITY): Payer: Self-pay

## 2022-06-16 ENCOUNTER — Emergency Department (HOSPITAL_COMMUNITY): Payer: PPO

## 2022-06-16 DIAGNOSIS — I272 Pulmonary hypertension, unspecified: Secondary | ICD-10-CM | POA: Diagnosis present

## 2022-06-16 DIAGNOSIS — Z28310 Unvaccinated for covid-19: Secondary | ICD-10-CM

## 2022-06-16 DIAGNOSIS — N189 Chronic kidney disease, unspecified: Secondary | ICD-10-CM | POA: Diagnosis present

## 2022-06-16 DIAGNOSIS — E890 Postprocedural hypothyroidism: Secondary | ICD-10-CM | POA: Diagnosis present

## 2022-06-16 DIAGNOSIS — Z8349 Family history of other endocrine, nutritional and metabolic diseases: Secondary | ICD-10-CM | POA: Diagnosis not present

## 2022-06-16 DIAGNOSIS — Z853 Personal history of malignant neoplasm of breast: Secondary | ICD-10-CM

## 2022-06-16 DIAGNOSIS — Z79890 Hormone replacement therapy (postmenopausal): Secondary | ICD-10-CM

## 2022-06-16 DIAGNOSIS — N1832 Chronic kidney disease, stage 3b: Secondary | ICD-10-CM | POA: Diagnosis present

## 2022-06-16 DIAGNOSIS — K219 Gastro-esophageal reflux disease without esophagitis: Secondary | ICD-10-CM

## 2022-06-16 DIAGNOSIS — R7989 Other specified abnormal findings of blood chemistry: Secondary | ICD-10-CM | POA: Diagnosis not present

## 2022-06-16 DIAGNOSIS — I350 Nonrheumatic aortic (valve) stenosis: Secondary | ICD-10-CM | POA: Diagnosis present

## 2022-06-16 DIAGNOSIS — J9601 Acute respiratory failure with hypoxia: Secondary | ICD-10-CM | POA: Diagnosis not present

## 2022-06-16 DIAGNOSIS — E782 Mixed hyperlipidemia: Secondary | ICD-10-CM | POA: Diagnosis not present

## 2022-06-16 DIAGNOSIS — I130 Hypertensive heart and chronic kidney disease with heart failure and stage 1 through stage 4 chronic kidney disease, or unspecified chronic kidney disease: Secondary | ICD-10-CM | POA: Diagnosis not present

## 2022-06-16 DIAGNOSIS — Z823 Family history of stroke: Secondary | ICD-10-CM | POA: Diagnosis not present

## 2022-06-16 DIAGNOSIS — Z87448 Personal history of other diseases of urinary system: Secondary | ICD-10-CM

## 2022-06-16 DIAGNOSIS — N170 Acute kidney failure with tubular necrosis: Secondary | ICD-10-CM | POA: Diagnosis not present

## 2022-06-16 DIAGNOSIS — R011 Cardiac murmur, unspecified: Secondary | ICD-10-CM | POA: Diagnosis present

## 2022-06-16 DIAGNOSIS — D649 Anemia, unspecified: Secondary | ICD-10-CM | POA: Diagnosis present

## 2022-06-16 DIAGNOSIS — T380X5A Adverse effect of glucocorticoids and synthetic analogues, initial encounter: Secondary | ICD-10-CM | POA: Diagnosis present

## 2022-06-16 DIAGNOSIS — Z85828 Personal history of other malignant neoplasm of skin: Secondary | ICD-10-CM

## 2022-06-16 DIAGNOSIS — Z79899 Other long term (current) drug therapy: Secondary | ICD-10-CM

## 2022-06-16 DIAGNOSIS — E034 Atrophy of thyroid (acquired): Secondary | ICD-10-CM | POA: Diagnosis not present

## 2022-06-16 DIAGNOSIS — I5043 Acute on chronic combined systolic (congestive) and diastolic (congestive) heart failure: Secondary | ICD-10-CM | POA: Diagnosis present

## 2022-06-16 DIAGNOSIS — E875 Hyperkalemia: Secondary | ICD-10-CM | POA: Diagnosis present

## 2022-06-16 DIAGNOSIS — I480 Paroxysmal atrial fibrillation: Secondary | ICD-10-CM | POA: Diagnosis not present

## 2022-06-16 DIAGNOSIS — Z801 Family history of malignant neoplasm of trachea, bronchus and lung: Secondary | ICD-10-CM

## 2022-06-16 DIAGNOSIS — N179 Acute kidney failure, unspecified: Secondary | ICD-10-CM | POA: Diagnosis present

## 2022-06-16 DIAGNOSIS — Z8249 Family history of ischemic heart disease and other diseases of the circulatory system: Secondary | ICD-10-CM

## 2022-06-16 DIAGNOSIS — J189 Pneumonia, unspecified organism: Principal | ICD-10-CM | POA: Diagnosis present

## 2022-06-16 DIAGNOSIS — E785 Hyperlipidemia, unspecified: Secondary | ICD-10-CM | POA: Diagnosis present

## 2022-06-16 DIAGNOSIS — R911 Solitary pulmonary nodule: Secondary | ICD-10-CM | POA: Diagnosis present

## 2022-06-16 DIAGNOSIS — M1990 Unspecified osteoarthritis, unspecified site: Secondary | ICD-10-CM | POA: Diagnosis present

## 2022-06-16 DIAGNOSIS — Z888 Allergy status to other drugs, medicaments and biological substances status: Secondary | ICD-10-CM

## 2022-06-16 DIAGNOSIS — I10 Essential (primary) hypertension: Secondary | ICD-10-CM | POA: Diagnosis not present

## 2022-06-16 DIAGNOSIS — J849 Interstitial pulmonary disease, unspecified: Secondary | ICD-10-CM | POA: Diagnosis not present

## 2022-06-16 DIAGNOSIS — J45909 Unspecified asthma, uncomplicated: Secondary | ICD-10-CM | POA: Diagnosis present

## 2022-06-16 DIAGNOSIS — Z20822 Contact with and (suspected) exposure to covid-19: Secondary | ICD-10-CM | POA: Diagnosis not present

## 2022-06-16 DIAGNOSIS — J439 Emphysema, unspecified: Secondary | ICD-10-CM | POA: Diagnosis not present

## 2022-06-16 DIAGNOSIS — Z91048 Other nonmedicinal substance allergy status: Secondary | ICD-10-CM

## 2022-06-16 DIAGNOSIS — Z882 Allergy status to sulfonamides status: Secondary | ICD-10-CM

## 2022-06-16 DIAGNOSIS — Z800 Family history of malignant neoplasm of digestive organs: Secondary | ICD-10-CM

## 2022-06-16 DIAGNOSIS — E039 Hypothyroidism, unspecified: Secondary | ICD-10-CM | POA: Diagnosis present

## 2022-06-16 DIAGNOSIS — J9611 Chronic respiratory failure with hypoxia: Secondary | ICD-10-CM | POA: Diagnosis present

## 2022-06-16 DIAGNOSIS — R0602 Shortness of breath: Secondary | ICD-10-CM | POA: Diagnosis not present

## 2022-06-16 LAB — BASIC METABOLIC PANEL
Anion gap: 9 (ref 5–15)
BUN: 29 mg/dL — ABNORMAL HIGH (ref 8–23)
CO2: 28 mmol/L (ref 22–32)
Calcium: 9.1 mg/dL (ref 8.9–10.3)
Chloride: 96 mmol/L — ABNORMAL LOW (ref 98–111)
Creatinine, Ser: 1.93 mg/dL — ABNORMAL HIGH (ref 0.44–1.00)
GFR, Estimated: 25 mL/min — ABNORMAL LOW (ref >60)
Glucose, Bld: 143 mg/dL — ABNORMAL HIGH (ref 70–99)
Potassium: 4.5 mmol/L (ref 3.5–5.1)
Sodium: 133 mmol/L — ABNORMAL LOW (ref 135–145)

## 2022-06-16 LAB — HEPATIC FUNCTION PANEL
ALT: 15 U/L (ref 0–44)
AST: 24 U/L (ref 15–41)
Albumin: 3.6 g/dL (ref 3.5–5.0)
Alkaline Phosphatase: 74 U/L (ref 38–126)
Bilirubin, Direct: 0.2 mg/dL (ref 0.0–0.2)
Indirect Bilirubin: 0.6 mg/dL (ref 0.3–0.9)
Total Bilirubin: 0.8 mg/dL (ref 0.3–1.2)
Total Protein: 7.2 g/dL (ref 6.5–8.1)

## 2022-06-16 LAB — URINALYSIS, ROUTINE W REFLEX MICROSCOPIC
Bilirubin Urine: NEGATIVE
Glucose, UA: NEGATIVE mg/dL
Hgb urine dipstick: NEGATIVE
Ketones, ur: NEGATIVE mg/dL
Leukocytes,Ua: NEGATIVE
Nitrite: NEGATIVE
Protein, ur: 100 mg/dL — AB
Specific Gravity, Urine: 1.018 (ref 1.005–1.030)
pH: 5 (ref 5.0–8.0)

## 2022-06-16 LAB — CBC
HCT: 29.5 % — ABNORMAL LOW (ref 36.0–46.0)
Hemoglobin: 9.9 g/dL — ABNORMAL LOW (ref 12.0–15.0)
MCH: 29.2 pg (ref 26.0–34.0)
MCHC: 33.6 g/dL (ref 30.0–36.0)
MCV: 87 fL (ref 80.0–100.0)
Platelets: 113 10*3/uL — ABNORMAL LOW (ref 150–400)
RBC: 3.39 MIL/uL — ABNORMAL LOW (ref 3.87–5.11)
RDW: 14.1 % (ref 11.5–15.5)
WBC: 14.9 10*3/uL — ABNORMAL HIGH (ref 4.0–10.5)
nRBC: 0 % (ref 0.0–0.2)

## 2022-06-16 LAB — BLOOD GAS, VENOUS
Acid-Base Excess: 8.8 mmol/L — ABNORMAL HIGH (ref 0.0–2.0)
Bicarbonate: 33.5 mmol/L — ABNORMAL HIGH (ref 20.0–28.0)
Drawn by: 32938
O2 Saturation: 90 %
Patient temperature: 37.4
pCO2, Ven: 47 mmHg (ref 44–60)
pH, Ven: 7.46 — ABNORMAL HIGH (ref 7.25–7.43)
pO2, Ven: 59 mmHg — ABNORMAL HIGH (ref 32–45)

## 2022-06-16 LAB — CBG MONITORING, ED: Glucose-Capillary: 131 mg/dL — ABNORMAL HIGH (ref 70–99)

## 2022-06-16 LAB — D-DIMER, QUANTITATIVE: D-Dimer, Quant: 2.26 ug/mL-FEU — ABNORMAL HIGH (ref 0.00–0.50)

## 2022-06-16 LAB — SARS CORONAVIRUS 2 BY RT PCR: SARS Coronavirus 2 by RT PCR: NEGATIVE

## 2022-06-16 LAB — BRAIN NATRIURETIC PEPTIDE: B Natriuretic Peptide: 547 pg/mL — ABNORMAL HIGH (ref 0.0–100.0)

## 2022-06-16 MED ORDER — IPRATROPIUM-ALBUTEROL 0.5-2.5 (3) MG/3ML IN SOLN
3.0000 mL | Freq: Once | RESPIRATORY_TRACT | Status: AC
  Administered 2022-06-16: 3 mL via RESPIRATORY_TRACT
  Filled 2022-06-16: qty 3

## 2022-06-16 MED ORDER — LACTATED RINGERS IV SOLN: INTRAVENOUS | Status: DC

## 2022-06-16 MED ORDER — SODIUM CHLORIDE 0.9 % IV SOLN
500.0000 mg | Freq: Once | INTRAVENOUS | Status: AC
  Administered 2022-06-16: 500 mg via INTRAVENOUS
  Filled 2022-06-16: qty 5

## 2022-06-16 MED ORDER — PANTOPRAZOLE SODIUM 40 MG PO TBEC
40.0000 mg | DELAYED_RELEASE_TABLET | Freq: Every day | ORAL | Status: DC
  Administered 2022-06-17 – 2022-06-19 (×3): 40 mg via ORAL
  Filled 2022-06-16 (×3): qty 1

## 2022-06-16 MED ORDER — MIRTAZAPINE 15 MG PO TABS
7.5000 mg | ORAL_TABLET | Freq: Every day | ORAL | Status: DC
  Administered 2022-06-17 – 2022-06-18 (×3): 7.5 mg via ORAL
  Filled 2022-06-16 (×3): qty 1

## 2022-06-16 MED ORDER — BISOPROLOL FUMARATE 5 MG PO TABS
5.0000 mg | ORAL_TABLET | Freq: Every day | ORAL | Status: DC
  Administered 2022-06-17 – 2022-06-19 (×3): 5 mg via ORAL
  Filled 2022-06-16 (×3): qty 1

## 2022-06-16 MED ORDER — SODIUM CHLORIDE 0.9 % IV SOLN
1.0000 g | Freq: Once | INTRAVENOUS | Status: AC
  Administered 2022-06-16: 1 g via INTRAVENOUS
  Filled 2022-06-16: qty 10

## 2022-06-16 MED ORDER — LOSARTAN POTASSIUM 50 MG PO TABS
25.0000 mg | ORAL_TABLET | Freq: Every day | ORAL | Status: DC
  Administered 2022-06-17 – 2022-06-19 (×3): 25 mg via ORAL
  Filled 2022-06-16 (×3): qty 1

## 2022-06-16 MED ORDER — HYDRALAZINE HCL 25 MG PO TABS
100.0000 mg | ORAL_TABLET | Freq: Two times a day (BID) | ORAL | Status: DC
  Administered 2022-06-17 – 2022-06-19 (×6): 100 mg via ORAL
  Filled 2022-06-16 (×6): qty 4

## 2022-06-16 MED ORDER — ATORVASTATIN CALCIUM 10 MG PO TABS
10.0000 mg | ORAL_TABLET | Freq: Every day | ORAL | Status: DC
  Administered 2022-06-17 – 2022-06-19 (×3): 10 mg via ORAL
  Filled 2022-06-16 (×3): qty 1

## 2022-06-16 MED ORDER — LEVOTHYROXINE SODIUM 75 MCG PO TABS
75.0000 ug | ORAL_TABLET | Freq: Every day | ORAL | Status: DC
  Administered 2022-06-17 – 2022-06-19 (×3): 75 ug via ORAL
  Filled 2022-06-16 (×3): qty 1

## 2022-06-16 MED ORDER — FLECAINIDE ACETATE 50 MG PO TABS
50.0000 mg | ORAL_TABLET | Freq: Two times a day (BID) | ORAL | Status: DC
  Administered 2022-06-17 – 2022-06-19 (×6): 50 mg via ORAL
  Filled 2022-06-16 (×6): qty 1

## 2022-06-16 NOTE — ED Provider Notes (Signed)
[facility] EMERGENCY DEPARTMENT Provider Note   CSN: [id number] Arrival date & time: 06/16/22  7262     History {Add pertinent medical, surgical, social history, OB history to HPI:1} Chief Complaint  Patient presents with   Weakness    [name] is a 88 y.o. female.   Weakness Patient presents for generalized weakness.  Medical history includes CHF, CKD, HLD, HTN, anemia, remote breast cancer, moderate aortic stenosis, asthma.  She was seen at PCPs office 3 days ago for fatigue, decreased appetite, and nonproductive cough.  This has been ongoing over the past week.  She has been laying in bed more than usual.  She endorses cough and shortness of breath.  There was reportedly hypoxia at home.     Home Medications Prior to Admission medications   Medication Sig Start Date End Date Taking? Authorizing Provider  acetaminophen (TYLENOL) 325 MG tablet Take 325 mg by mouth daily as needed for moderate pain or headache.    [provider]  atorvastatin (LIPITOR) 20 MG tablet TAKE 1/2 TABLET BY MOUTH EVERY DAY 01/08/22   Ronnie Doss M, DO  bisoprolol (ZEBETA) 5 MG tablet Take 1 tablet (5 mg total) by mouth daily. 09/11/21   [name], DO  cholecalciferol (VITAMIN D) 1000 units tablet Take 1,000 Units by mouth daily.    [provider]  ferrous sulfate 325 (65 FE) MG tablet Take 325 mg by mouth 2 (two) times daily with a meal.    [provider]  flecainide (TAMBOCOR) 50 MG tablet Take 1 tablet (50 mg total) by mouth 2 (two) times daily. 08/21/21   Minus Breeding, MD  hydrALAZINE (APRESOLINE) 100 MG tablet Take 100 mg by mouth 2 (two) times daily.    [provider]  hydroxypropyl methylcellulose / hypromellose (ISOPTO TEARS / GONIOVISC) 2.5 % ophthalmic solution Place 1 drop into both eyes daily.    [provider]  letrozole [facility]) 2.5 MG tablet TAKE 1 TABLET BY MOUTH EVERY DAY 01/06/22   Derek Jack, MD   levocetirizine (XYZAL) 5 MG tablet Take 1 tablet (5 mg total) by mouth daily as needed for allergies (rhinitis). 06/13/22   Gwenlyn Perking, FNP  levothyroxine (SYNTHROID) 75 MCG tablet TAKE 1 TABLET BY MOUTH EVERY DAY 12/12/21   Ronnie Doss M, DO  losartan (COZAAR) 25 MG tablet Take 1 tablet (25 mg total) by mouth daily. 09/11/21 04/29/22  Ronnie Doss M, DO  mirtazapine (REMERON) 7.5 MG tablet TAKE 1 TABLET BY MOUTH AT BEDTIME. 02/05/22   Carlan, Chelsea L, NP  pantoprazole (PROTONIX) 40 MG tablet Take 1 tablet (40 mg total) by mouth daily. 12/02/21   Carlan, Deatra Robinson, NP      Allergies    Amlodipine, Lisinopril, Xeroform occlusive gauze strip [bismuth tribromoph-petrolatum], Isosorbide nitrate, Sulfonamide derivatives, and Terazosin    Review of Systems   Review of Systems  Neurological:  Positive for weakness.    Physical Exam Updated Vital Signs BP (!) 155/91 (BP Location: Left Arm)   Pulse 81   Temp 99.3 F (37.4 C) (Oral)   Resp (!) 24   Ht '5\' 6"'$  (1.676 m)   Wt 50 kg   SpO2 95%   BMI 17.79 kg/m  Physical Exam  ED Results / Procedures / Treatments   Labs (all labs ordered are listed, but only abnormal results are displayed) Labs Reviewed  SARS CORONAVIRUS 2 BY RT PCR  BASIC METABOLIC PANEL  CBC  URINALYSIS, ROUTINE W REFLEX MICROSCOPIC  CBG MONITORING, ED    EKG None  Radiology DG Chest 2 View  Result Date: 06/16/2022 CLINICAL DATA:  Shortness of breath. EXAM: CHEST - 2 VIEW COMPARISON:  Chest radiograph dated 08/31/2021. FINDINGS: Background of emphysema. There is diffuse interstitial reticulonodular densities which may represent congestion and edema or pneumonia. Clinical correlation is recommended. No pleural effusion or pneumothorax. Stable mild cardiomegaly. Atherosclerotic calcification of the aorta. No acute osseous pathology. IMPRESSION: Diffuse interstitial densities may represent congestion and edema or pneumonia. Electronically Signed   By:  Anner Crete M.D.   On: 06/16/2022 20:11    Procedures Procedures  {Document cardiac monitor, telemetry assessment procedure when appropriate:1}  Medications Ordered in ED Medications - No data to display  ED Course/ Medical Decision Making/ A&P                           Medical Decision Making Amount and/or Complexity of Data Reviewed Labs: ordered. Radiology: ordered.   ***  {Document critical care time when appropriate:1} {Document review of labs and clinical decision tools ie heart score, Chads2Vasc2 etc:1}  {Document your independent review of radiology images, and any outside records:1} {Document your discussion with family members, caretakers, and with consultants:1} {Document social determinants of health affecting pt's care:1} {Document your decision making why or why not admission, treatments were needed:1} Final Clinical Impression(s) / ED Diagnoses Final diagnoses:  None    Rx / DC Orders ED Discharge Orders     None

## 2022-06-16 NOTE — ED Triage Notes (Signed)
Pt daughter states she has been feeling bad for about a week now, developed a dry cough a few days ago, hasn't been getting out of bed for the last few days due to weakness and SOB. Daughter reports she checked her o2 at home and it was reading 82%.    Pts o2 is 84% on RA in triage, placed on 2L [place].

## 2022-06-17 ENCOUNTER — Encounter (HOSPITAL_COMMUNITY): Payer: Self-pay | Admitting: Family Medicine

## 2022-06-17 ENCOUNTER — Inpatient Hospital Stay (HOSPITAL_COMMUNITY): Payer: PPO

## 2022-06-17 DIAGNOSIS — E782 Mixed hyperlipidemia: Secondary | ICD-10-CM

## 2022-06-17 DIAGNOSIS — K219 Gastro-esophageal reflux disease without esophagitis: Secondary | ICD-10-CM

## 2022-06-17 DIAGNOSIS — N1832 Chronic kidney disease, stage 3b: Secondary | ICD-10-CM

## 2022-06-17 DIAGNOSIS — N170 Acute kidney failure with tubular necrosis: Secondary | ICD-10-CM | POA: Diagnosis not present

## 2022-06-17 DIAGNOSIS — E034 Atrophy of thyroid (acquired): Secondary | ICD-10-CM

## 2022-06-17 DIAGNOSIS — I10 Essential (primary) hypertension: Secondary | ICD-10-CM

## 2022-06-17 DIAGNOSIS — J9601 Acute respiratory failure with hypoxia: Secondary | ICD-10-CM | POA: Diagnosis not present

## 2022-06-17 DIAGNOSIS — R7989 Other specified abnormal findings of blood chemistry: Secondary | ICD-10-CM

## 2022-06-17 DIAGNOSIS — I480 Paroxysmal atrial fibrillation: Secondary | ICD-10-CM

## 2022-06-17 LAB — CBC WITH DIFFERENTIAL/PLATELET
Abs Immature Granulocytes: 0.07 10*3/uL (ref 0.00–0.07)
Basophils Absolute: 0 10*3/uL (ref 0.0–0.1)
Basophils Relative: 0 %
Eosinophils Absolute: 0 10*3/uL (ref 0.0–0.5)
Eosinophils Relative: 0 %
HCT: 24.4 % — ABNORMAL LOW (ref 36.0–46.0)
Hemoglobin: 8 g/dL — ABNORMAL LOW (ref 12.0–15.0)
Immature Granulocytes: 1 %
Lymphocytes Relative: 10 %
Lymphs Abs: 1.2 10*3/uL (ref 0.7–4.0)
MCH: 29 pg (ref 26.0–34.0)
MCHC: 32.8 g/dL (ref 30.0–36.0)
MCV: 88.4 fL (ref 80.0–100.0)
Monocytes Absolute: 0.8 10*3/uL (ref 0.1–1.0)
Monocytes Relative: 7 %
Neutro Abs: 9.2 10*3/uL — ABNORMAL HIGH (ref 1.7–7.7)
Neutrophils Relative %: 82 %
Platelets: 92 10*3/uL — ABNORMAL LOW (ref 150–400)
RBC: 2.76 MIL/uL — ABNORMAL LOW (ref 3.87–5.11)
RDW: 14.1 % (ref 11.5–15.5)
WBC: 11.2 10*3/uL — ABNORMAL HIGH (ref 4.0–10.5)
nRBC: 0 % (ref 0.0–0.2)

## 2022-06-17 LAB — COMPREHENSIVE METABOLIC PANEL
ALT: 13 U/L (ref 0–44)
AST: 19 U/L (ref 15–41)
Albumin: 3.1 g/dL — ABNORMAL LOW (ref 3.5–5.0)
Alkaline Phosphatase: 61 U/L (ref 38–126)
Anion gap: 9 (ref 5–15)
BUN: 30 mg/dL — ABNORMAL HIGH (ref 8–23)
CO2: 28 mmol/L (ref 22–32)
Calcium: 8.6 mg/dL — ABNORMAL LOW (ref 8.9–10.3)
Chloride: 97 mmol/L — ABNORMAL LOW (ref 98–111)
Creatinine, Ser: 1.85 mg/dL — ABNORMAL HIGH (ref 0.44–1.00)
GFR, Estimated: 26 mL/min — ABNORMAL LOW (ref >60)
Glucose, Bld: 106 mg/dL — ABNORMAL HIGH (ref 70–99)
Potassium: 4.3 mmol/L (ref 3.5–5.1)
Sodium: 134 mmol/L — ABNORMAL LOW (ref 135–145)
Total Bilirubin: 0.6 mg/dL (ref 0.3–1.2)
Total Protein: 6.2 g/dL — ABNORMAL LOW (ref 6.5–8.1)

## 2022-06-17 LAB — HIV ANTIBODY (ROUTINE TESTING W REFLEX): HIV Screen 4th Generation wRfx: NONREACTIVE

## 2022-06-17 LAB — PROCALCITONIN: Procalcitonin: 5.55 ng/mL

## 2022-06-17 LAB — MAGNESIUM: Magnesium: 2.1 mg/dL (ref 1.7–2.4)

## 2022-06-17 MED ORDER — GUAIFENESIN-DM 100-10 MG/5ML PO SYRP
5.0000 mL | ORAL_SOLUTION | ORAL | Status: DC | PRN
  Administered 2022-06-19: 5 mL via ORAL
  Filled 2022-06-17: qty 5

## 2022-06-17 MED ORDER — ONDANSETRON HCL 4 MG/2ML IJ SOLN: 4.0000 mg | Freq: Four times a day (QID) | INTRAMUSCULAR | Status: DC | PRN

## 2022-06-17 MED ORDER — ORAL CARE MOUTH RINSE: 15.0000 mL | OROMUCOSAL | Status: DC | PRN

## 2022-06-17 MED ORDER — SODIUM CHLORIDE 0.9 % IV SOLN
2.0000 g | INTRAVENOUS | Status: DC
  Administered 2022-06-17 – 2022-06-18 (×2): 2 g via INTRAVENOUS
  Filled 2022-06-17 (×2): qty 20

## 2022-06-17 MED ORDER — SODIUM CHLORIDE 0.9 % IV SOLN
500.0000 mg | INTRAVENOUS | Status: DC
  Administered 2022-06-17 – 2022-06-18 (×2): 500 mg via INTRAVENOUS
  Filled 2022-06-17 (×2): qty 5

## 2022-06-17 MED ORDER — FERROUS SULFATE 325 (65 FE) MG PO TABS
325.0000 mg | ORAL_TABLET | Freq: Every day | ORAL | Status: DC
  Administered 2022-06-18 – 2022-06-19 (×2): 325 mg via ORAL
  Filled 2022-06-17 (×2): qty 1

## 2022-06-17 MED ORDER — ALBUTEROL SULFATE (2.5 MG/3ML) 0.083% IN NEBU: 2.5000 mg | INHALATION_SOLUTION | RESPIRATORY_TRACT | Status: DC | PRN

## 2022-06-17 MED ORDER — ACETAMINOPHEN 325 MG PO TABS: 650.0000 mg | ORAL_TABLET | Freq: Four times a day (QID) | ORAL | Status: DC | PRN

## 2022-06-17 MED ORDER — TECHNETIUM TO 99M ALBUMIN AGGREGATED
4.0000 | Freq: Once | INTRAVENOUS | Status: AC | PRN
  Administered 2022-06-17: 4.4 via INTRAVENOUS

## 2022-06-17 MED ORDER — HEPARIN SODIUM (PORCINE) 5000 UNIT/ML IJ SOLN
5000.0000 [IU] | Freq: Three times a day (TID) | INTRAMUSCULAR | Status: DC
  Administered 2022-06-17 – 2022-06-18 (×4): 5000 [IU] via SUBCUTANEOUS
  Filled 2022-06-17 (×6): qty 1

## 2022-06-17 MED ORDER — SODIUM CHLORIDE 0.9 % IV SOLN: INTRAVENOUS | Status: DC

## 2022-06-17 NOTE — Assessment & Plan Note (Signed)
-   Continue Zebeta, hydralazine, losartan - Continue to monitor

## 2022-06-17 NOTE — Progress Notes (Signed)
ASSUMPTION OF CARE NOTE   06/17/2022 11:51 AM  [name] was seen and examined.  The H&P by the admitting provider, orders, imaging was reviewed.  Please see new orders.  Will continue to follow.  V/Q scan pending to rule out PE.  Treating for CAP.  Follow procalcitonin.  Sepsis ruled out.  Reduced IV fluid.  Ambulate with PT.  Add IS.  Updated Daughter by telephone 10/31.    Vitals:   06/16/22 2311 06/17/22 0502  BP: (!) 154/60 (!) 159/50  Pulse: 72 76  Resp: 18 20  Temp: 97.9 F (36.6 C) 97.8 F (36.6 C)  SpO2: 98% 98%    Results for orders placed or performed during the hospital encounter of 06/16/22  SARS Coronavirus 2 by RT PCR (hospital order, performed in [place] hospital lab) *cepheid single result test* Anterior Nasal Swab   Specimen: Anterior Nasal Swab  Result Value Ref Range   SARS Coronavirus 2 by RT PCR NEGATIVE NEGATIVE  Basic metabolic panel  Result Value Ref Range   Sodium 133 (L) 135 - 145 mmol/L   Potassium 4.5 3.5 - 5.1 mmol/L   Chloride 96 (L) 98 - 111 mmol/L   CO2 28 22 - 32 mmol/L   Glucose, Bld 143 (H) 70 - 99 mg/dL   BUN 29 (H) 8 - 23 mg/dL   Creatinine, Ser 1.93 (H) 0.44 - 1.00 mg/dL   Calcium 9.1 8.9 - 10.3 mg/dL   GFR, Estimated 25 (L) >60 mL/min   Anion gap 9 5 - 15  CBC  Result Value Ref Range   WBC 14.9 (H) 4.0 - 10.5 K/uL   RBC 3.39 (L) 3.87 - 5.11 MIL/uL   Hemoglobin 9.9 (L) 12.0 - 15.0 g/dL   HCT 29.5 (L) 36.0 - 46.0 %   MCV 87.0 80.0 - 100.0 fL   MCH 29.2 26.0 - 34.0 pg   MCHC 33.6 30.0 - 36.0 g/dL   RDW 14.1 11.5 - 15.5 %   Platelets 113 (L) 150 - 400 K/uL   nRBC 0.0 0.0 - 0.2 %  Urinalysis, Routine w reflex microscopic Urine, Clean Catch  Result Value Ref Range   Color, Urine AMBER (A) YELLOW   APPearance HAZY (A) CLEAR   Specific Gravity, Urine 1.018 1.005 - 1.030   pH 5.0 5.0 - 8.0   Glucose, UA NEGATIVE NEGATIVE mg/dL   Hgb urine dipstick NEGATIVE NEGATIVE   Bilirubin Urine NEGATIVE NEGATIVE   Ketones, ur  NEGATIVE NEGATIVE mg/dL   Protein, ur 100 (A) NEGATIVE mg/dL   Nitrite NEGATIVE NEGATIVE   Leukocytes,Ua NEGATIVE NEGATIVE   RBC / HPF 11-20 0 - 5 RBC/hpf   WBC, UA 0-5 0 - 5 WBC/hpf   Bacteria, UA RARE (A) NONE SEEN   Squamous Epithelial / LPF 0-5 0 - 5   Mucus PRESENT   Brain natriuretic peptide  Result Value Ref Range   B Natriuretic Peptide 547.0 (H) 0.0 - 100.0 pg/mL  Blood gas, venous (at [facility] and [facility], not at [facility])  Result Value Ref Range   pH, Ven 7.46 (H) 7.25 - 7.43   pCO2, Ven 47 44 - 60 mmHg   pO2, Ven 59 (H) 32 - 45 mmHg   Bicarbonate 33.5 (H) 20.0 - 28.0 mmol/L   Acid-Base Excess 8.8 (H) 0.0 - 2.0 mmol/L   O2 Saturation 90 %   Patient temperature 37.4    Collection site RIGHT ANTECUBITAL    Drawn by [id number]   D-dimer, quantitative  Result Value Ref Range   D-Dimer, Quant 2.26 (H) 0.00 - 0.50 ug/mL-FEU  Hepatic function panel  Result Value Ref Range   Total Protein 7.2 6.5 - 8.1 g/dL   Albumin 3.6 3.5 - 5.0 g/dL   AST 24 15 - 41 U/L   ALT 15 0 - 44 U/L   Alkaline Phosphatase 74 38 - 126 U/L   Total Bilirubin 0.8 0.3 - 1.2 mg/dL   Bilirubin, Direct 0.2 0.0 - 0.2 mg/dL   Indirect Bilirubin 0.6 0.3 - 0.9 mg/dL  HIV Antibody (routine testing w rflx)  Result Value Ref Range   HIV Screen 4th Generation wRfx Non Reactive Non Reactive  Comprehensive metabolic panel  Result Value Ref Range   Sodium 134 (L) 135 - 145 mmol/L   Potassium 4.3 3.5 - 5.1 mmol/L   Chloride 97 (L) 98 - 111 mmol/L   CO2 28 22 - 32 mmol/L   Glucose, Bld 106 (H) 70 - 99 mg/dL   BUN 30 (H) 8 - 23 mg/dL   Creatinine, Ser 1.85 (H) 0.44 - 1.00 mg/dL   Calcium 8.6 (L) 8.9 - 10.3 mg/dL   Total Protein 6.2 (L) 6.5 - 8.1 g/dL   Albumin 3.1 (L) 3.5 - 5.0 g/dL   AST 19 15 - 41 U/L   ALT 13 0 - 44 U/L   Alkaline Phosphatase 61 38 - 126 U/L   Total Bilirubin 0.6 0.3 - 1.2 mg/dL   GFR, Estimated 26 (L) >60 mL/min   Anion gap 9 5 - 15  Magnesium  Result Value Ref Range   Magnesium 2.1 1.7 - 2.4 mg/dL   CBC with Differential/Platelet  Result Value Ref Range   WBC 11.2 (H) 4.0 - 10.5 K/uL   RBC 2.76 (L) 3.87 - 5.11 MIL/uL   Hemoglobin 8.0 (L) 12.0 - 15.0 g/dL   HCT 24.4 (L) 36.0 - 46.0 %   MCV 88.4 80.0 - 100.0 fL   MCH 29.0 26.0 - 34.0 pg   MCHC 32.8 30.0 - 36.0 g/dL   RDW 14.1 11.5 - 15.5 %   Platelets 92 (L) 150 - 400 K/uL   nRBC 0.0 0.0 - 0.2 %   Neutrophils Relative % 82 %   Neutro Abs 9.2 (H) 1.7 - 7.7 K/uL   Lymphocytes Relative 10 %   Lymphs Abs 1.2 0.7 - 4.0 K/uL   Monocytes Relative 7 %   Monocytes Absolute 0.8 0.1 - 1.0 K/uL   Eosinophils Relative 0 %   Eosinophils Absolute 0.0 0.0 - 0.5 K/uL   Basophils Relative 0 %   Basophils Absolute 0.0 0.0 - 0.1 K/uL   WBC Morphology MORPHOLOGY UNREMARKABLE    RBC Morphology MORPHOLOGY UNREMARKABLE    Immature Granulocytes 1 %   Abs Immature Granulocytes 0.07 0.00 - 0.07 K/uL  Procalcitonin - Baseline  Result Value Ref Range   Procalcitonin 5.55 ng/mL  CBG monitoring, ED  Result Value Ref Range   Glucose-Capillary 131 (H) 70 - 99 mg/dL   Time spent: 30 mins  Murvin Natal, MD [facility]   06/16/2022  7:45 PM How to contact the [facility] Attending or Consulting provider [place] or covering provider during after hours 7P -7A, for this patient?  Check the care team in [facility] and look for a) attending/consulting [facility] provider listed and b) the [facility] team listed Log into [email or web address] and use [place]'s universal password to access. If you do not have the password, please contact the hospital operator. Locate  the [facility] provider you are looking for under [facility] and page to a number that you can be directly reached. If you still have difficulty reaching the provider, please page the [facility] (Director on Call) for the Hospitalists listed on amion for assistance.

## 2022-06-17 NOTE — Assessment & Plan Note (Addendum)
-   Requiring 2 L nasal cannula - Tachypneic up to 24 - Chest x-ray shows diffuse interstitial densities that may represent congestion and edema or pneumonia- procalcitonin pending - With leukocytosis of 14.9, dyspnea, productive cough, most likely pneumonia - D-dimer was ordered in the ED, 2.26 - Due to AKI we will go with VQ scan - Wean off oxygen as tolerated - As needed albuterol - Continue to monitor

## 2022-06-17 NOTE — Assessment & Plan Note (Signed)
-   Creatinine at baseline 1.59 - Creatinine today 1.93 - Hold nephrotoxic agents when possible - Gentle hydration - Trend in the a.m. - Clinically patient appears as pneumonia, but imaging did read edema versus pneumonia, so there is a chance that this is cardiorenal syndrome.  If creatinine goes up with fluids, will likely need diuresis in the a.m.

## 2022-06-17 NOTE — TOC Initial Note (Signed)
Transition of Care [facility]) - Initial/Assessment Note    Patient Details  Name: [name] MRN: [id number] Date of Birth: March 10, 1934  Transition of Care [facility]) CM/SW Contact:    Ihor Gully, LCSW Phone Number: 06/17/2022, 11:49 AM  Clinical Narrative:                 Patient from home.   Expected Discharge Plan: Home/Self Care Barriers to Discharge: Continued Medical Work up   Patient Goals and CMS Choice Patient states their goals for this hospitalization and ongoing recovery are:: return home      Expected Discharge Plan and Services Expected Discharge Plan: Home/Self Care       Living arrangements for the past 2 months: Single Family Home                                      Prior Living Arrangements/Services Living arrangements for the past 2 months: Single Family Home Lives with:: Self                   Activities of Daily Living Home Assistive Devices/Equipment: Eyeglasses, Grab bars in shower, Grab bars around toilet, Hearing aid ADL Screening (condition at time of admission) Patient's cognitive ability adequate to safely complete daily activities?: Yes Is the patient deaf or have difficulty hearing?: Yes Does the patient have difficulty seeing, even when wearing glasses/contacts?: No Does the patient have difficulty concentrating, remembering, or making decisions?: No Patient able to express need for assistance with ADLs?: Yes Does the patient have difficulty dressing or bathing?: Yes Independently performs ADLs?: Yes (appropriate for developmental age) Does the patient have difficulty walking or climbing stairs?: Yes (SOB when climbing stairs) Weakness of Legs: None Weakness of Arms/Hands: None  Permission Sought/Granted                  Emotional Assessment       Orientation: : Oriented to Self, Oriented to Place, Oriented to  Time, Oriented to Situation Alcohol / Substance Use: Not Applicable Psych Involvement: No  (comment)  Admission diagnosis:  Acute respiratory failure with hypoxia ([place]) [J96.01] Patient Active Problem List   Diagnosis Date Noted   GERD (gastroesophageal reflux disease) 06/17/2022   Elevated d-dimer 06/17/2022   Anemia due to chronic blood loss 10/01/2021   Duodenal ulcer 10/01/2021   Loss of weight    Early satiety    Symptomatic anemia 08/31/2021   Acute-on-chronic kidney injury ([place]) 08/31/2021   Dry cough 08/31/2021   UGI bleed 08/31/2021   Failure to thrive in adult 08/31/2021   Mitral valve insufficiency 08/20/2021   History of right breast cancer    Malnutrition of moderate degree 08/03/2021   Acute respiratory failure with hypoxia ([place]) 08/01/2021   Acute bronchitis 08/01/2021   Decreased appetite 07/25/2021   Protein-calorie malnutrition ([place]) 07/25/2021   Skin tear of right lower leg without complication 10/93/2355   Non-intractable vomiting 04/08/2021   Edema 02/22/2021   Constipation 01/23/2021   Osteoarthritis of knee 12/13/2020   History of arthroscopy of knee 12/13/2020   Pain in joint of left knee 12/13/2020   Atrial tachycardia 07/26/2019   Aortic valve sclerosis 07/26/2019   Aortic stenosis, moderate 12/01/2018   Breast cancer, right ([place]) 04/08/2018   Malignant neoplasm of lower-outer quadrant of right breast of female, estrogen receptor positive ([place]) 02/25/2018   Tear of lateral meniscus of knee 10/27/2017  Vertigo 09/09/2016   Renal vascular disease 10/31/2015   Nodule of right lung 10/31/2015   Asthma without status asthmaticus 10/31/2015   Essential hypertension, malignant    Hypothyroidism 10/15/2015   Pancytopenia ([place]) 10/15/2015   Hyponatremia 10/14/2015   Thrombocytopenia ([place]) 06/16/2015   Right renal artery stenosis ([place]) 05/09/2015   Chronic diastolic heart failure ([place]) 04/03/2014   CKD (chronic kidney disease) stage 3, GFR 30-59 ml/min (HCC) 04/02/2014   Paroxysmal atrial fibrillation ([place]) 01/25/2014   Normocytic anemia  07/19/2013   Bilateral lower extremity edema 06/30/2013   Hypertension with fluid overload 06/30/2013   Varicose veins of lower extremities with other complications 52/84/1324   ABNORMAL STRESS ELECTROCARDIOGRAM 01/23/2010   Hyperlipidemia 12/18/2009   Essential hypertension 12/18/2009   RENAL INSUFFICIENCY, CHRONIC 12/18/2009   Arthropathy 12/18/2009   PCP:  [name], DO Pharmacy[facility]   [id number] [address] [id number]: [phone]: [phone]    Social Determinants of Health (SDOH) Interventions    Readmission Risk Interventions    09/01/2021    3:43 PM 08/05/2021    8:33 AM  Readmission Risk Prevention Plan  Transportation Screening Complete Complete  PCP or Specialist Appt within 3-5 Days Complete   HRI or Home Care Consult Complete Complete  Social Work Consult for [place]anning/Counseling Complete Complete  Palliative Care Screening Not Applicable Not Applicable  Medication Review (Press photographer Complete Complete

## 2022-06-17 NOTE — Assessment & Plan Note (Signed)
Continue flecainide 

## 2022-06-17 NOTE — H&P (Signed)
History and Physical    Patient: [name] EPP:[id number] DOB: [birth year] DOA: 06/16/2022 DOS: the patient was seen and examined on 06/17/2022 PCP: [name], DO  Patient coming from: Home  Chief Complaint:  Chief Complaint  Patient presents with   Weakness   HPI: [name] is a 88 y.o. female with medical history significant of Amio, atrial fibrillation on flecainide, CKD, heart murmur, hyperlipidemia, hypothyroidism, renal insufficiency, renal artery stenosis, and more presents the ED with a chief complaint of cough and dyspnea.  Patient reports that started about a week ago.  She notes that it started shortly after she received her flu shot.  She has had associated generalized weakness and fatigue.  Patient denies orthopnea.  She denies chest pain.  Patient reports her cough is productive of white sputum.  She describes it as white and frothy.  She has not had any fever.  Her dyspnea is worse on exertion.  She does not wear oxygen at baseline.  Patient has no other complaints at this time.  Patient does not smoke, does not drink, does not use illicit drugs.  She is not vaccinated for COVID.  Patient would not want to be left on life support Review of Systems: As mentioned in the history of present illness. All other systems reviewed and are negative. Past Medical History:  Diagnosis Date   Anemia    Arthritis    Asthma    Atrial fibrillation ([place])    Basal cell carcinoma 02/06/2009   Left ear targus- ([facility])   Basal cell carcinoma 09/28/2008   Right back-([id number])   CKD (chronic kidney disease), stage III (HCC)    Heart murmur    Hyperlipidemia    Hypertension    Hypothyroidism    Nodule of right lung    Right upper lobe   PONV (postoperative nausea and vomiting)    Renal insufficiency    Chronic   Renal vascular disease    Right renal artery stenosis ([place]) 05/09/2015   Squamous cell carcinoma of skin 09/07/2019   KA-Left shin-txpbx   Vertigo    Past  Surgical History:  Procedure Laterality Date   BIOPSY  04/09/2021   Procedure: BIOPSY;  Surgeon: Harvel Quale, MD;  [facility];  Service: Gastroenterology;;   BREAST LUMPECTOMY WITH RADIOACTIVE SEED LOCALIZATION Right 04/08/2018   Procedure: BREAST LUMPECTOMY WITH RADIOACTIVE SEED LOCALIZATION;  Surgeon: Rolm Bookbinder, MD;  Location: [facility];  Service: General;  Laterality: Right;   COLONOSCOPY  2016   diverticulosis, hemorrhoids   ESOPHAGOGASTRODUODENOSCOPY  2016   normal   ESOPHAGOGASTRODUODENOSCOPY (EGD) WITH PROPOFOL N/A 04/09/2021   Procedure: ESOPHAGOGASTRODUODENOSCOPY (EGD) WITH PROPOFOL;  Surgeon: Harvel Quale, MD;  [facility];  Service: Gastroenterology;  Laterality: N/A;  12:45   ESOPHAGOGASTRODUODENOSCOPY (EGD) WITH PROPOFOL N/A 09/01/2021   Procedure: ESOPHAGOGASTRODUODENOSCOPY (EGD) WITH PROPOFOL;  Surgeon: Eloise Harman, DO;  [facility];  Service: Endoscopy;  Laterality: N/A;   IR GENERIC HISTORICAL  08/29/2016   IR US GUIDE VASC ACCESS RIGHT 08/29/2016 [facility]   IR GENERIC HISTORICAL  08/29/2016   IR RENAL BILAT S&I MOD SED 08/29/2016 [facility]   KNEE ARTHROSCOPY Right    2019   THYROIDECTOMY, PARTIAL Right 1981   middle lobe removed 1st, then right lobe removed 7-8 years later (approx. 73)   Social History:  reports that she has never smoked. She has never used smokeless tobacco. She reports that she does not drink  alcohol and does not use drugs.  Allergies  Allergen Reactions   Amlodipine Other (See Comments)    edema   Lisinopril     cough   Xeroform Occlusive Gauze Strip [Bismuth Tribromoph-Petrolatum]    Isosorbide Nitrate Nausea Only   Sulfonamide Derivatives Nausea Only and Rash   Terazosin Hives and Nausea Only    Family History  Problem Relation Age of Onset   Thyroid disease Mother    Stroke Father    Hypertension Brother    Lung cancer Maternal Uncle    Cancer Maternal  Grandmother        Liver   Lung cancer Maternal Uncle    Hypertension Daughter    Hypercholesterolemia Daughter    Hypercholesterolemia Daughter     Prior to Admission medications   Medication Sig Start Date End Date Taking? Authorizing Provider  acetaminophen (TYLENOL) 325 MG tablet Take 325 mg by mouth daily as needed for moderate pain or headache.   Yes [provider]  atorvastatin (LIPITOR) 20 MG tablet TAKE 1/2 TABLET BY MOUTH EVERY DAY 01/08/22  Yes Gottschalk, Ashly M, DO  bisoprolol (ZEBETA) 5 MG tablet Take 1 tablet (5 mg total) by mouth daily. 09/11/21  Yes Ronnie Doss M, DO  cholecalciferol (VITAMIN D) 1000 units tablet Take 1,000 Units by mouth daily.   Yes [provider]  ferrous sulfate 325 (65 FE) MG tablet Take 325 mg by mouth 2 (two) times daily with a meal.   Yes [provider]  flecainide (TAMBOCOR) 50 MG tablet Take 1 tablet (50 mg total) by mouth 2 (two) times daily. 08/21/21  Yes Minus Breeding, MD  hydrALAZINE (APRESOLINE) 100 MG tablet Take 100 mg by mouth 2 (two) times daily.   Yes [provider]  hydroxypropyl methylcellulose / hypromellose (ISOPTO TEARS / GONIOVISC) 2.5 % ophthalmic solution Place 1 drop into both eyes daily.   Yes [provider]  letrozole (FEMARA) 2.5 MG tablet TAKE 1 TABLET BY MOUTH EVERY DAY 01/06/22  Yes Derek Jack, MD  levocetirizine (XYZAL) 5 MG tablet Take 1 tablet (5 mg total) by mouth daily as needed for allergies (rhinitis). 06/13/22  Yes Gwenlyn Perking, FNP  levothyroxine (SYNTHROID) 75 MCG tablet TAKE 1 TABLET BY MOUTH EVERY DAY 12/12/21  Yes Gottschalk, Ashly M, DO  losartan (COZAAR) 25 MG tablet Take 1 tablet (25 mg total) by mouth daily. 09/11/21 06/16/22 Yes Gottschalk, Ashly M, DO  mirtazapine (REMERON) 7.5 MG tablet TAKE 1 TABLET BY MOUTH AT BEDTIME. 02/05/22  Yes Carlan, Chelsea L, NP  Multiple Vitamin (MULTIVITAMIN) tablet Take 1 tablet by mouth daily.   Yes [provider]  OVER THE COUNTER MEDICATION calcium   Yes [provider]  pantoprazole (PROTONIX) 40 MG tablet Take 1 tablet (40 mg total) by mouth daily. 12/02/21  Yes Gabriel Rung, NP    Physical Exam: Vitals:   06/16/22 1941 06/16/22 1941 06/16/22 2311  BP:  (!) 155/91 (!) 154/60  Pulse:  81 72  Resp:  (!) 24 18  Temp:  99.3 F (37.4 C) 97.9 F (36.6 C)  TempSrc:  Oral Oral  SpO2:  95% 98%  Weight: 50 kg    Height: '5\' 6"'$  (1.676 m)     1.  General: Patient lying supine in bed,  no acute distress   2. Psychiatric: Alert and oriented x 3, mood and behavior normal for situation, pleasant and cooperative with exam   3. Neurologic: Speech and language are normal,  face is symmetric, moves all 4 extremities voluntarily, at baseline without acute deficits on limited exam   4. HEENMT:  Head is atraumatic, normocephalic, pupils reactive to light, neck is supple, trachea is midline, mucous membranes are moist   5. Respiratory : Lungs are clear to auscultation bilaterally without wheezing, rhonchi, rales, no cyanosis, no increase in work of breathing or accessory muscle use   6. Cardiovascular : Heart rate normal, rhythm is regular, murmur present, rubs or gallops, 2+ pitting edema at the ankles, peripheral pulses palpated   7. Gastrointestinal:  Abdomen is soft, nondistended, nontender to palpation bowel sounds active, no masses or organomegaly palpated   8. Skin:  Skin is warm, dry and intact without rashes, acute lesions, or ulcers on limited exam   9.Musculoskeletal:  No acute deformities or trauma, no asymmetry in tone, no peripheral edema, peripheral pulses palpated, no tenderness to palpation in the extremities  Data Reviewed: In the ED 99.3, heart rate 81, respiratory rate 24, blood pressure 155/91, satting 95% on 2 L nasal cannula Blood gas shows a pH of 7.46, PCO2 47, PO2 59 Leukocytosis of 14.9, hemoglobin 9.9 Chemistry reveals creatinine of  1.93 D-dimer 2.26 BNP 547 Chest x-ray shows diffuse interstitial densities that may represent congestion and edema versus pneumonia - COVID-negative - EKG shows a heart rate of 80, normal sinus rhythm, QTc 4 and 75 Patient was started on Rocephin, Zithromax, given a DuoNeb, and started on lactated Ringer's 100 mL/h Admission requested for acute respiratory failure with hypoxia, tachypnea   Assessment and Plan: * Acute respiratory failure with hypoxia (HCC) - Requiring 2 L nasal cannula - Tachypneic up to 24 - Chest x-ray shows diffuse interstitial densities that may represent congestion and edema or pneumonia- procalcitonin pending - With leukocytosis of 14.9, dyspnea, productive cough, most likely pneumonia - D-dimer was ordered in the ED, 2.26 - Due to AKI we will go with VQ scan - Wean off oxygen as tolerated - As needed albuterol - Continue to monitor  Elevated d-dimer - With dyspnea, and hypoxia - Assess for PE with VQ scan in the a.m.  GERD (gastroesophageal reflux disease) - Continue Protonix  Acute-on-chronic kidney injury ([place]) - Creatinine at baseline 1.59 - Creatinine today 1.93 - Hold nephrotoxic agents when possible - Gentle hydration - Trend in the a.m. - Clinically patient appears as pneumonia, but imaging did read edema versus pneumonia, so there is a chance that this is cardiorenal syndrome.  If creatinine goes up with fluids, will likely need diuresis in the a.m.  Hypothyroidism - Continue Synthroid  Paroxysmal atrial fibrillation (HCC) - Continue flecainide  Essential hypertension - Continue Zebeta, hydralazine, losartan - Continue to monitor  Hyperlipidemia - Continue statin      Advance Care Planning:   Code Status: Full Code   Consults: None  Family Communication: No family at bedside  Severity of Illness: The appropriate patient status for this patient is INPATIENT. Inpatient status is judged to be reasonable and necessary in order to  provide the required intensity of service to ensure the patient's safety. The patient's presenting symptoms, physical exam findings, and initial radiographic and laboratory data in the context of their chronic comorbidities is felt to place them at high risk for further clinical deterioration. Furthermore, it is not anticipated that the patient will be medically stable for discharge from the hospital within 2 midnights of admission.   * I certify that at the point of admission it is my clinical judgment that the  patient will require inpatient hospital care spanning beyond 2 midnights from the point of admission due to high intensity of service, high risk for further deterioration and high frequency of surveillance required.*  Author: Rolla Plate, DO 06/17/2022 2:56 AM  For on call review [email or web address].

## 2022-06-17 NOTE — Assessment & Plan Note (Signed)
Continue Protonix °

## 2022-06-17 NOTE — Assessment & Plan Note (Signed)
Continue statin. 

## 2022-06-17 NOTE — Assessment & Plan Note (Signed)
-   With dyspnea, and hypoxia - Assess for PE with VQ scan in the a.m.

## 2022-06-17 NOTE — Assessment & Plan Note (Signed)
Continue Synthroid °

## 2022-06-18 DIAGNOSIS — J9601 Acute respiratory failure with hypoxia: Secondary | ICD-10-CM | POA: Diagnosis not present

## 2022-06-18 LAB — CBC WITH DIFFERENTIAL/PLATELET
Abs Immature Granulocytes: 0.17 10*3/uL — ABNORMAL HIGH (ref 0.00–0.07)
Basophils Absolute: 0 10*3/uL (ref 0.0–0.1)
Basophils Relative: 0 %
Eosinophils Absolute: 0 10*3/uL (ref 0.0–0.5)
Eosinophils Relative: 1 %
HCT: 24.6 % — ABNORMAL LOW (ref 36.0–46.0)
Hemoglobin: 7.9 g/dL — ABNORMAL LOW (ref 12.0–15.0)
Immature Granulocytes: 2 %
Lymphocytes Relative: 12 %
Lymphs Abs: 1 10*3/uL (ref 0.7–4.0)
MCH: 28.9 pg (ref 26.0–34.0)
MCHC: 32.1 g/dL (ref 30.0–36.0)
MCV: 90.1 fL (ref 80.0–100.0)
Monocytes Absolute: 0.6 10*3/uL (ref 0.1–1.0)
Monocytes Relative: 7 %
Neutro Abs: 6.5 10*3/uL (ref 1.7–7.7)
Neutrophils Relative %: 78 %
Platelets: 93 10*3/uL — ABNORMAL LOW (ref 150–400)
RBC: 2.73 MIL/uL — ABNORMAL LOW (ref 3.87–5.11)
RDW: 14.3 % (ref 11.5–15.5)
WBC: 8.3 10*3/uL (ref 4.0–10.5)
nRBC: 0 % (ref 0.0–0.2)

## 2022-06-18 LAB — PROCALCITONIN: Procalcitonin: 0.19 ng/mL

## 2022-06-18 LAB — BASIC METABOLIC PANEL
Anion gap: 5 (ref 5–15)
BUN: 26 mg/dL — ABNORMAL HIGH (ref 8–23)
CO2: 30 mmol/L (ref 22–32)
Calcium: 8.1 mg/dL — ABNORMAL LOW (ref 8.9–10.3)
Chloride: 99 mmol/L (ref 98–111)
Creatinine, Ser: 1.58 mg/dL — ABNORMAL HIGH (ref 0.44–1.00)
GFR, Estimated: 32 mL/min — ABNORMAL LOW (ref >60)
Glucose, Bld: 112 mg/dL — ABNORMAL HIGH (ref 70–99)
Potassium: 4.1 mmol/L (ref 3.5–5.1)
Sodium: 134 mmol/L — ABNORMAL LOW (ref 135–145)

## 2022-06-18 MED ORDER — IPRATROPIUM-ALBUTEROL 0.5-2.5 (3) MG/3ML IN SOLN
RESPIRATORY_TRACT | Status: AC
  Administered 2022-06-18: 3 mL
  Filled 2022-06-18: qty 3

## 2022-06-18 MED ORDER — IPRATROPIUM-ALBUTEROL 0.5-2.5 (3) MG/3ML IN SOLN
3.0000 mL | Freq: Four times a day (QID) | RESPIRATORY_TRACT | Status: DC
  Administered 2022-06-18 (×3): 3 mL via RESPIRATORY_TRACT
  Filled 2022-06-18 (×2): qty 3

## 2022-06-18 MED ORDER — FUROSEMIDE 20 MG PO TABS: 20.0000 mg | ORAL_TABLET | Freq: Every day | ORAL | Status: DC

## 2022-06-18 MED ORDER — FUROSEMIDE 10 MG/ML IJ SOLN
20.0000 mg | Freq: Every day | INTRAMUSCULAR | Status: DC
  Administered 2022-06-18 – 2022-06-19 (×2): 20 mg via INTRAVENOUS
  Filled 2022-06-18 (×2): qty 2

## 2022-06-18 MED ORDER — ENSURE ENLIVE PO LIQD
237.0000 mL | Freq: Two times a day (BID) | ORAL | Status: DC
  Administered 2022-06-18: 1 via ORAL
  Administered 2022-06-19 (×2): 237 mL via ORAL

## 2022-06-18 MED ORDER — METHYLPREDNISOLONE SODIUM SUCC 40 MG IJ SOLR
40.0000 mg | Freq: Two times a day (BID) | INTRAMUSCULAR | Status: DC
  Administered 2022-06-18 – 2022-06-19 (×3): 40 mg via INTRAVENOUS
  Filled 2022-06-18 (×4): qty 1

## 2022-06-18 MED ORDER — LACTATED RINGERS IV SOLN: INTRAVENOUS | Status: DC

## 2022-06-18 MED ORDER — GUAIFENESIN ER 600 MG PO TB12
600.0000 mg | ORAL_TABLET | Freq: Two times a day (BID) | ORAL | Status: DC
  Administered 2022-06-18 – 2022-06-19 (×3): 600 mg via ORAL
  Filled 2022-06-18 (×3): qty 1

## 2022-06-18 NOTE — Progress Notes (Signed)
Ambulates with standby to bathroom and walked in hallway with standby for O2 walk test.  Needed 3 liters ambulating

## 2022-06-18 NOTE — Plan of Care (Signed)
  Problem: Acute Rehab PT Goals(only PT should resolve) Goal: Pt Will Go Supine/Side To Sit Outcome: Progressing Flowsheets (Taken 06/18/2022 1023) Pt will go Supine/Side to Sit: Independently Goal: Patient Will Transfer Sit To/From Stand Outcome: Progressing Flowsheets (Taken 06/18/2022 1023) Patient will transfer sit to/from stand: Independently Goal: Pt Will Transfer Bed To Chair/Chair To Bed Outcome: Progressing Flowsheets (Taken 06/18/2022 1023) Pt will Transfer Bed to Chair/Chair to Bed: Independently Goal: Pt Will Ambulate Outcome: Progressing Flowsheets (Taken 06/18/2022 1023) Pt will Ambulate:  > 125 feet  Independently   Zigmund Gottron, SPT

## 2022-06-18 NOTE — Progress Notes (Signed)
SATURATION QUALIFICATIONS: (This note is used to comply with regulatory documentation for home oxygen)  Patient Saturations on Room Air at Rest = 86%  Patient Saturations on Room Air while Ambulating = 84%  Patient Saturations on 3 Liters of oxygen while Ambulating = 95%  Please briefly explain why patient needs home oxygen:Desats on room air

## 2022-06-18 NOTE — Evaluation (Signed)
Physical Therapy Evaluation Patient Details Name: [name] MRN: [id number] DOB: 06/20/1934 Today's Date: 06/18/2022  History of Present Illness  [name] is a 88 y.o. female with medical history significant of Amio, atrial fibrillation on flecainide, CKD, heart murmur, hyperlipidemia, hypothyroidism, renal insufficiency, renal artery stenosis, and more presents the ED with a chief complaint of cough and dyspnea.  Patient reports that started about a week ago.  She notes that it started shortly after she received her flu shot.  She has had associated generalized weakness and fatigue.  Patient denies orthopnea.  She denies chest pain.  Patient reports her cough is productive of white sputum.  She describes it as white and frothy.  She has not had any fever.  Her dyspnea is worse on exertion.  She does not wear oxygen at baseline.  Patient has no other complaints at this time.   Clinical Impression  Patient presents seated EOB with nurse in room with SpO2 reading 86% on 2 LPM O2. Patient ambulates with good return in hallway without AD/loss of balance. Patient's SpO2 decreased to 84% while ambulating on 2 LPM, oxygen increased to 3 LPM with SpO2 at 95% during ambulation, patient taken back down to 2 LPM O2 upon resting in room with SpO2 averaging 95%. Patient left on 2 LPM and tolerated sitting up in chair after therapy. Patient will benefit from continued skilled physical therapy in hospital and recommended venue below to increase strength, balance, endurance for safe ADLs and gait.     Recommendations for follow up therapy are one component of a multi-disciplinary discharge planning process, led by the attending physician.  Recommendations may be updated based on patient status, additional functional criteria and insurance authorization.  Follow Up Recommendations Home health PT      Assistance Recommended at Discharge PRN  Patient can return home with the following  Assistance with  cooking/housework    Equipment Recommendations None recommended by PT  Recommendations for Other Services       Functional Status Assessment Patient has had a recent decline in their functional status and demonstrates the ability to make significant improvements in function in a reasonable and predictable amount of time.     Precautions / Restrictions Precautions Precautions: None Restrictions Weight Bearing Restrictions: No      Mobility  Bed Mobility Overal bed mobility: Modified Independent             General bed mobility comments: increased time    Transfers Overall transfer level: Independent Equipment used: None                    Ambulation/Gait Ambulation/Gait assistance: Independent   Assistive device: None Gait Pattern/deviations: Decreased step length - right, Decreased step length - left, Decreased stride length Gait velocity: decreased     General Gait Details: patient ambulates with good return in hallway without AD/loss of balance. Patient on 2 LPM O2 satting 86% at rest (taken by RN in room), SpO2 decreased to 84% while ambulating, increased oxygen to 3 LPM with SpO2 at 95% during ambulation, patient taken back down to 2 LPM O2 upon resting in rooom with SpO2 at 93%. Patient left on 2 LPM  Stairs            Wheelchair Mobility    Modified Rankin (Stroke Patients Only)       Balance Overall balance assessment: Mild deficits observed, not formally tested  Pertinent Vitals/Pain Pain Assessment Pain Assessment: No/denies pain    Home Living Family/patient expects to be discharged to:: Private residence Living Arrangements: Alone (has daughter who stays with her at night but otherwise lives alone) Available Help at Discharge: Family;Available PRN/intermittently Type of Home: House Home Access: Stairs to enter Entrance Stairs-Rails: Left Entrance Stairs-Number of  Steps: 1   Home Layout: One level Home Equipment: Cane - single point;Shower seat;Other (comment) (grabs bars in walk in shower)      Prior Function Prior Level of Function : Independent/Modified Independent;Driving             Mobility Comments: Community ambulator without AD, drives ADLs Comments: Independent with ADLs, mostly independent with iADLs but daughter assists with cleaning     Hand Dominance        Extremity/Trunk Assessment   Upper Extremity Assessment Upper Extremity Assessment: Overall WFL for tasks assessed    Lower Extremity Assessment Lower Extremity Assessment: Overall WFL for tasks assessed    Cervical / Trunk Assessment Cervical / Trunk Assessment: Normal  Communication   Communication: No difficulties  Cognition Arousal/Alertness: Awake/alert Behavior During Therapy: WFL for tasks assessed/performed Overall Cognitive Status: Within Functional Limits for tasks assessed                                          General Comments      Exercises     Assessment/Plan    PT Assessment Patient needs continued PT services  PT Problem List Decreased strength;Decreased activity tolerance;Decreased balance;Decreased mobility       PT Treatment Interventions DME instruction;Balance training;Gait training;Stair training;Functional mobility training;Patient/family education;Therapeutic activities;Therapeutic exercise    PT Goals (Current goals can be found in the Care Plan section)  Acute Rehab PT Goals Patient Stated Goal: return home PT Goal Formulation: With patient Time For Goal Achievement: 06/25/22 Potential to Achieve Goals: Good    Frequency Min 3X/week     Co-evaluation               AM-PAC PT "6 Clicks" Mobility  Outcome Measure Help needed turning from your back to your side while in a flat bed without using bedrails?: None Help needed moving from lying on your back to sitting on the side of a flat bed  without using bedrails?: None Help needed moving to and from a bed to a chair (including a wheelchair)?: None Help needed standing up from a chair using your arms (e.g., wheelchair or bedside chair)?: None Help needed to walk in hospital room?: None Help needed climbing 3-5 steps with a railing? : A Little 6 Click Score: 23    End of Session Equipment Utilized During Treatment: Oxygen Activity Tolerance: Patient tolerated treatment well Patient left: in chair;with nursing/sitter in room;with call bell/phone within reach Nurse Communication: Mobility status PT Visit Diagnosis: Unsteadiness on feet (R26.81);Other abnormalities of gait and mobility (R26.89);Muscle weakness (generalized) (M62.81)    Time: 2878-6767 PT Time Calculation (min) (ACUTE ONLY): 16 min   Charges:   PT Evaluation $PT Eval Low Complexity: 1 Low PT Treatments $Therapeutic Activity: 8-22 mins        Zigmund Gottron, SPT

## 2022-06-18 NOTE — Progress Notes (Addendum)
Initial Nutrition Assessment  DOCUMENTATION CODES:      INTERVENTION:  Recommend Regular diet given pt age and recent decreased appetite  Ensure Enlive po BID, each supplement provides 350 kcal and 20 grams of protein.   NUTRITION DIAGNOSIS:   Inadequate oral intake (few days prior to admission) related to acute illness as evidenced by per patient/family report.   GOAL:   Pt to meet >/= 90% of their estimated nutrition needs    MONITOR:  PO intake, Supplement acceptance, Labs, Weight trends  REASON FOR ASSESSMENT:   Malnutrition Screening Tool    ASSESSMENT: patient is a 88 yo female with hx of CKD, Amio, HLD and hypothyroidism. Presents with dyspnea and cough.   Patient is eating 75-100% of documented meals. Able to feed herself. Patient has not been eating as well as usual the past week PTA. She is taking Remeron (7.5 mg). Lunch tray observed: >75% consumed. Feeding herself. She is complaining that the food is not a good as it was last time she was here. Encouraged her to inform nutrition services if meals are not to her satisfaction and alternate selections will be provided.  Patient verbalized expectation of being discharged tomorrow.   Patient reports she used to weigh 147 lb years ago but has gradually lost weight over time to her reported UBW-111 lb (50.4 kg). Currently she weighs 49.9 kg.    Patient ambulated with nursing -requiring 3 Liters oxygen.   Medications reviewed and include: ferrous sulfate, lipitor, solumedrol, remeron.     Latest Ref Rng & Units 06/18/2022    3:56 AM 06/17/2022    2:59 AM 06/16/2022    8:33 PM  BMP  Glucose 70 - 99 mg/dL 112  106  143   BUN 8 - 23 mg/dL '26  30  29   '$ Creatinine 0.44 - 1.00 mg/dL 1.58  1.85  1.93   Sodium 135 - 145 mmol/L 134  134  133   Potassium 3.5 - 5.1 mmol/L 4.1  4.3  4.5   Chloride 98 - 111 mmol/L 99  97  96   CO2 22 - 32 mmol/L '30  28  28   '$ Calcium 8.9 - 10.3 mg/dL 8.1  8.6  9.1       NUTRITION - FOCUSED  PHYSICAL EXAM: Nutrition-Focused physical exam completed. Findings are mild buccal, orbital fat depletion, mild dorsal, temporal muscle depletion, and no edema.     Diet Order:   Diet Order             Diet Heart Room service appropriate? Yes; Fluid consistency: Thin  Diet effective now                   EDUCATION NEEDS:  Education needs have been addressed  Skin:  Skin Assessment: Reviewed RN Assessment  Last BM:  10/31  Height:   Ht Readings from Last 1 Encounters:  06/16/22 '5\' 6"'$  (1.676 m)    Weight:   Wt Readings from Last 1 Encounters:  06/16/22 49.9 kg    Ideal Body Weight:   59 kg  BMI:  Body mass index is 17.76 kg/m.  Estimated Nutritional Needs:   Kcal:  1400-1600  Protein:  70-75 gr  Fluid:  1500 ml daily  Colman Cater MS,RD,CSG,LDN Contact: Shea Evans

## 2022-06-18 NOTE — Progress Notes (Signed)
Vital signs stable. Patient with dry cough, offered Robitussin but patient declined. O2 at 2L , ambulating to bathroom with minimal assist.

## 2022-06-18 NOTE — TOC Progression Note (Signed)
Transition of Care [facility]) - Progression Note    Patient Details  Name: [name] MRN: [id number] Date of Birth: 12-17-1933  Transition of Care [facility]) CM/SW Contact  Ihor Gully, LCSW Phone Number: 06/18/2022, 2:45 PM  Clinical Narrative:    Per nurse, patient in need of home oxygen. Discussed DME providers with patient. Referral made to [facility]. Attending aware of needed oxygen orders.     Expected Discharge Plan: Home/Self Care Barriers to Discharge: Continued Medical Work up  Expected Discharge Plan and Services Expected Discharge Plan: Home/Self Care       Living arrangements for the past 2 months: Single Family Home                 DME Arranged: Oxygen DME Agency: [facility] Date DME Agency Contacted: 06/18/22 Time DME Agency Contacted: [id number] Representative spoke with at DME Agency: [place] ([place]) Interventions    Readmission Risk Interventions    09/01/2021    3:43 PM 08/05/2021    8:33 AM  Readmission Risk Prevention Plan  Transportation Screening Complete Complete  PCP or Specialist Appt within 3-5 Days Complete   HRI or Home Care Consult Complete Complete  Social Work Consult for [place] Planning/Counseling Complete Complete  Palliative Care Screening Not Applicable Not Applicable  Medication Review Press photographer) Complete Complete

## 2022-06-18 NOTE — Progress Notes (Signed)
PROGRESS NOTE     [name], is a 88 y.o. female, DOB - 1934/07/10, VPX:[id number]  Admit date - 06/16/2022   Admitting Physician Rolla Plate, DO  Outpatient Primary MD for the patient is [name], DO  LOS - 2  Chief Complaint  Patient presents with   Weakness        Brief Narrative:  88 y.o. female with medical history significant of Amio, atrial fibrillation on flecainide, CKD, heart murmur, hyperlipidemia, hypothyroidism, renal insufficiency, renal artery stenosis admitted on 06/17/22 with acute hypoxic respiratory failure secondary to pneumonia   -Assessment and Plan: 1) Acute respiratory failure with hypoxia due to pneumonia and CHF exacerbation - PCT 5.55 >> 0.19 -WBC 11.2 > 8.3 -Pulmonary embolism ruled out -Currently weaned down to 2 to 3 L of oxygen via nasal cannula -Continue azithromycin and Rocephin -Continue mucolytics and bronchodilators  2)HFpEF--acute on chronic systolic dysfunction CHF -Admission chest x-ray suggestive of pulmonary venous congestion -BNP elevated at almost 600 -Echo from July 2023 with EF of 65 to 70% with grade 2 diastolic dysfunction, mild pulmonary hypertension and moderate aortic stenosis -Lasix 20 mg daily as ordered  GERD (gastroesophageal reflux disease) - Continue Protonix  Acute-on-chronic kidney injury ([place]) - Creatinine at baseline 1.59 - Creatinine down to 1.58 from 1.93 -renally adjust medications, avoid nephrotoxic agents / dehydration  / hypotension  Hypothyroidism - Continue Synthroid  Paroxysmal atrial fibrillation (HCC) - Continue flecainide  Essential hypertension - Continue Zebeta, hydralazine, losartan  Hyperlipidemia - Continue statin  -Generalized weakness and deconditioning--- physical therapy recommends home health PT upon discharge  Disposition/Need for in-Hospital Stay- patient unable to be discharged at this time due to -- -acute hypoxic respiratory failure due to pneumonia  and CHF exacerbation--requiring IV antibiotics and IV diuretics*  Status is: Inpatient   Disposition: The patient is from: Home              Anticipated d/c is to: Home with [facility]              Anticipated d/c date is: 2 days              Patient currently is not medically stable to d/c. Barriers: Not Clinically Stable-   Code Status :  -  Code Status: Full Code   Family Communication:    NA (patient is alert, awake and coherent)   DVT Prophylaxis  :   - SCDs   heparin injection 5,000 Units Start: 06/17/22 0600 SCDs Start: 06/17/22 0245   Lab Results  Component Value Date   PLT 93 (L) 06/18/2022    Inpatient Medications  Scheduled Meds:  atorvastatin  10 mg Oral Daily   bisoprolol  5 mg Oral Daily   feeding supplement  237 mL Oral BID BM   ferrous sulfate  325 mg Oral Q breakfast   flecainide  50 mg Oral BID   guaiFENesin  600 mg Oral BID   heparin  5,000 Units Subcutaneous Q8H   hydrALAZINE  100 mg Oral BID   ipratropium-albuterol  3 mL Nebulization Q6H   levothyroxine  75 mcg Oral Q0600   losartan  25 mg Oral Daily   methylPREDNISolone (SOLU-MEDROL) injection  40 mg Intravenous Q12H   mirtazapine  7.5 mg Oral QHS   pantoprazole  40 mg Oral Daily   Continuous Infusions:  azithromycin 500 mg (06/17/22 2241)   cefTRIAXone (ROCEPHIN)  IV 2 g (06/17/22 2132)   lactated ringers 10 mL/hr at 06/18/22 0901  PRN Meds:.acetaminophen, albuterol, guaiFENesin-dextromethorphan, ondansetron (ZOFRAN) IV, mouth rinse   Anti-infectives (From admission, onward)    Start     Dose/Rate Route Frequency Ordered Stop   06/17/22 2200  cefTRIAXone (ROCEPHIN) 2 g in sodium chloride 0.9 % 100 mL IVPB        2 g 200 mL/hr over 30 Minutes Intravenous Every 24 hours 06/17/22 0246 06/22/22 2159   06/17/22 2200  azithromycin (ZITHROMAX) 500 mg in sodium chloride 0.9 % 250 mL IVPB        500 mg 250 mL/hr over 60 Minutes Intravenous Every 24 hours 06/17/22 0246 06/22/22 2159   06/16/22 2130   cefTRIAXone (ROCEPHIN) 1 g in sodium chloride 0.9 % 100 mL IVPB        1 g 200 mL/hr over 30 Minutes Intravenous  Once 06/16/22 2118 06/16/22 2230   06/16/22 2130  azithromycin (ZITHROMAX) 500 mg in sodium chloride 0.9 % 250 mL IVPB        500 mg 250 mL/hr over 60 Minutes Intravenous  Once 06/16/22 2118 06/16/22 2345         Subjective: [name] today has no fevers, no emesis,  No chest pain,   -Cough and shortness of breath improving -Patient is requiring 2 to 3 L of oxygen with ambulation    Objective: Vitals:   06/17/22 2059 06/18/22 0436 06/18/22 0823 06/18/22 1350  BP: (!) 152/43 (!) 134/43  (!) 157/52  Pulse: 69 62 72 63  Resp: '18 16 16 17  '$ Temp: 98.6 F (37 C) 98 F (36.7 C)  98 F (36.7 C)  TempSrc:  Oral  Oral  SpO2: 99% 100% 98% 100%  Weight:      Height:        Intake/Output Summary (Last 24 hours) at 06/18/2022 1631 Last data filed at 06/18/2022 1311 Gross per 24 hour  Intake 1435.94 ml  Output --  Net 1435.94 ml   Filed Weights   06/16/22 1941 06/16/22 2311  Weight: 50 kg 49.9 kg   Physical Exam Gen:- Awake Alert, dyspnea on exertion persist HEENT:- [place].AT, No sclera icterus Nose- 3L/min Neck-Supple Neck,No JVD,.  Lungs-diminished breath sounds, no wheezing  CV- S1, S2 normal, regular , 3/6 sm Abd-  +ve B.Sounds, Abd Soft, No tenderness,    Extremity/Skin:-Improving edema, pedal pulses present  Psych-affect is appropriate, oriented x3 Neuro-generalized weakness, no new focal deficits, no tremors  Data Reviewed: I have personally reviewed following labs and imaging studies  CBC: Recent Labs  Lab 06/16/22 2033 06/17/22 0259 06/18/22 0356  WBC 14.9* 11.2* 8.3  NEUTROABS  --  9.2* 6.5  HGB 9.9* 8.0* 7.9*  HCT 29.5* 24.4* 24.6*  MCV 87.0 88.4 90.1  PLT 113* 92* 93*   Basic Metabolic Panel: Recent Labs  Lab 06/16/22 2033 06/17/22 0259 06/18/22 0356  NA 133* 134* 134*  K 4.5 4.3 4.1  CL 96* 97* 99  CO2 '28 28 30  '$ GLUCOSE 143*  106* 112*  BUN 29* 30* 26*  CREATININE 1.93* 1.85* 1.58*  CALCIUM 9.1 8.6* 8.1*  MG  --  2.1  --    GFR: Estimated Creatinine Clearance: 20.1 mL/min (A) (by C-G formula based on SCr of 1.58 mg/dL (H)). Liver Function Tests: Recent Labs  Lab 06/16/22 2034 06/17/22 0259  AST 24 19  ALT 15 13  ALKPHOS 74 61  BILITOT 0.8 0.6  PROT 7.2 6.2*  ALBUMIN 3.6 3.1*   Recent Results (from the past 240 hour(s))  SARS Coronavirus 2  by RT PCR (hospital order, performed in [facility] hospital lab) *cepheid single result test* Anterior Nasal Swab     Status: None   Collection Time: 06/16/22  7:49 PM   Specimen: Anterior Nasal Swab  Result Value Ref Range Status   SARS Coronavirus 2 by RT PCR NEGATIVE NEGATIVE Final    Comment: (NOTE) SARS-CoV-2 target nucleic acids are NOT DETECTED.  The SARS-CoV-2 RNA is generally detectable in upper and lower respiratory specimens during the acute phase of infection. The lowest concentration of SARS-CoV-2 viral copies this assay can detect is 250 copies / mL. A negative result does not preclude SARS-CoV-2 infection and should not be used as the sole basis for treatment or other patient management decisions.  A negative result may occur with improper specimen collection / handling, submission of specimen other than nasopharyngeal swab, presence of viral mutation(s) within the areas targeted by this assay, and inadequate number of viral copies (<250 copies / mL). A negative result must be combined with clinical observations, patient history, and epidemiological information.  Fact Sheet for Patients:   https://www.patel.info/  Fact Sheet for Healthcare Providers: https://hall.com/  This test is not yet approved or  cleared by the [place] FDA and has been authorized for detection and/or diagnosis of SARS-CoV-2 by FDA under an Emergency Use Authorization (EUA).  This EUA will remain in effect (meaning this  test can be used) for the duration of the COVID-19 declaration under Section 564(b)(1) of the Act, 21 U.S.C. section 360bbb-3(b)(1), unless the authorization is terminated or revoked sooner.  Performed at [facility], [address] [place], [place] [address]     Radiology Studies: NM Pulmonary Perfusion  Result Date: 06/17/2022 CLINICAL DATA:  Shortness of breath, bronchitis, history asthma, hypertension, breast cancer, GERD, atrial fibrillation, CHF, chronic kidney disease stage III EXAM: NUCLEAR MEDICINE PERFUSION LUNG SCAN TECHNIQUE: Perfusion images were obtained in multiple projections after intravenous injection of radiopharmaceutical. Ventilation scans intentionally deferred if perfusion scan and chest x-ray adequate for interpretation during COVID 19 epidemic. RADIOPHARMACEUTICALS:  4.4 mCi Tc-71mMAA IV COMPARISON:  Chest radiograph 06/16/2022 FINDINGS: Peripheral irregularity of perfusion throughout both lungs. Several small subsegmental perfusion defects within periphery of RIGHT middle lobe. No moderate to large subsegmental perfusion defects. Chest radiograph demonstrates emphysematous changes and scattered interstitial lung disease changes with scattered interstitial infiltrates, greatest in RIGHT middle lobe. Overall appearance is most suggestive of parenchymal lung disease rather than pulmonary embolism. IMPRESSION: Scattered peripheral perfusion changes particularly lateral RIGHT middle lobe, pattern most consistent with parenchymal lung disease and the emphysematous changes and interstitial infiltrates seen on chest radiograph. Overall pattern is not suggestive pulmonary embolism. Electronically Signed   By: MLavonia DanaM.D.   On: 06/17/2022 11:50   DG Chest 2 View  Result Date: 06/16/2022 CLINICAL DATA:  Shortness of breath. EXAM: CHEST - 2 VIEW COMPARISON:  Chest radiograph dated 08/31/2021. FINDINGS: Background of emphysema. There is diffuse interstitial reticulonodular  densities which may represent congestion and edema or pneumonia. Clinical correlation is recommended. No pleural effusion or pneumothorax. Stable mild cardiomegaly. Atherosclerotic calcification of the aorta. No acute osseous pathology. IMPRESSION: Diffuse interstitial densities may represent congestion and edema or pneumonia. Electronically Signed   By: AAnner CreteM.D.   On: 06/16/2022 20:11    Scheduled Meds:  atorvastatin  10 mg Oral Daily   bisoprolol  5 mg Oral Daily   feeding supplement  237 mL Oral BID BM   ferrous sulfate  325 mg Oral Q  breakfast   flecainide  50 mg Oral BID   guaiFENesin  600 mg Oral BID   heparin  5,000 Units Subcutaneous Q8H   hydrALAZINE  100 mg Oral BID   ipratropium-albuterol  3 mL Nebulization Q6H   levothyroxine  75 mcg Oral Q0600   losartan  25 mg Oral Daily   methylPREDNISolone (SOLU-MEDROL) injection  40 mg Intravenous Q12H   mirtazapine  7.5 mg Oral QHS   pantoprazole  40 mg Oral Daily   Continuous Infusions:  azithromycin 500 mg (06/17/22 2241)   cefTRIAXone (ROCEPHIN)  IV 2 g (06/17/22 2132)   lactated ringers 10 mL/hr at 06/18/22 0901    LOS: 2 days   Roxan Hockey M.D on 06/18/2022 at 4:31 PM  Go to [email or web address] - for contact info  [facility] - Office  [phone]  If 7PM-7AM, please contact night-coverage [email or web address] 06/18/2022, 4:31 PM

## 2022-06-19 LAB — CBC WITH DIFFERENTIAL/PLATELET
Abs Immature Granulocytes: 0.36 10*3/uL — ABNORMAL HIGH (ref 0.00–0.07)
Basophils Absolute: 0 10*3/uL (ref 0.0–0.1)
Basophils Relative: 0 %
Eosinophils Absolute: 0 10*3/uL (ref 0.0–0.5)
Eosinophils Relative: 0 %
HCT: 25.4 % — ABNORMAL LOW (ref 36.0–46.0)
Hemoglobin: 8.2 g/dL — ABNORMAL LOW (ref 12.0–15.0)
Immature Granulocytes: 3 %
Lymphocytes Relative: 5 %
Lymphs Abs: 0.6 10*3/uL — ABNORMAL LOW (ref 0.7–4.0)
MCH: 29 pg (ref 26.0–34.0)
MCHC: 32.3 g/dL (ref 30.0–36.0)
MCV: 89.8 fL (ref 80.0–100.0)
Monocytes Absolute: 0.2 10*3/uL (ref 0.1–1.0)
Monocytes Relative: 1 %
Neutro Abs: 11.2 10*3/uL — ABNORMAL HIGH (ref 1.7–7.7)
Neutrophils Relative %: 91 %
Platelets: 100 10*3/uL — ABNORMAL LOW (ref 150–400)
RBC: 2.83 MIL/uL — ABNORMAL LOW (ref 3.87–5.11)
RDW: 13.7 % (ref 11.5–15.5)
WBC: 12.4 10*3/uL — ABNORMAL HIGH (ref 4.0–10.5)
nRBC: 0 % (ref 0.0–0.2)

## 2022-06-19 LAB — BASIC METABOLIC PANEL
Anion gap: 8 (ref 5–15)
BUN: 34 mg/dL — ABNORMAL HIGH (ref 8–23)
CO2: 30 mmol/L (ref 22–32)
Calcium: 8.5 mg/dL — ABNORMAL LOW (ref 8.9–10.3)
Chloride: 97 mmol/L — ABNORMAL LOW (ref 98–111)
Creatinine, Ser: 1.67 mg/dL — ABNORMAL HIGH (ref 0.44–1.00)
GFR, Estimated: 30 mL/min — ABNORMAL LOW (ref >60)
Glucose, Bld: 159 mg/dL — ABNORMAL HIGH (ref 70–99)
Potassium: 5.2 mmol/L — ABNORMAL HIGH (ref 3.5–5.1)
Sodium: 135 mmol/L (ref 135–145)

## 2022-06-19 MED ORDER — PREDNISONE 20 MG PO TABS: 40.0000 mg | ORAL_TABLET | Freq: Every day | ORAL | 0 refills | Status: AC

## 2022-06-19 MED ORDER — IPRATROPIUM-ALBUTEROL 0.5-2.5 (3) MG/3ML IN SOLN: 3.0000 mL | Freq: Two times a day (BID) | RESPIRATORY_TRACT | Status: DC

## 2022-06-19 MED ORDER — IPRATROPIUM-ALBUTEROL 0.5-2.5 (3) MG/3ML IN SOLN
3.0000 mL | Freq: Three times a day (TID) | RESPIRATORY_TRACT | Status: DC
  Administered 2022-06-19: 3 mL via RESPIRATORY_TRACT
  Filled 2022-06-19: qty 3

## 2022-06-19 MED ORDER — LOSARTAN POTASSIUM 25 MG PO TABS: 25.0000 mg | ORAL_TABLET | Freq: Every day | ORAL | 3 refills | Status: DC

## 2022-06-19 MED ORDER — CEPHALEXIN 500 MG PO CAPS: 500.0000 mg | ORAL_CAPSULE | Freq: Three times a day (TID) | ORAL | 0 refills | Status: AC

## 2022-06-19 MED ORDER — FUROSEMIDE 20 MG PO TABS: 20.0000 mg | ORAL_TABLET | Freq: Every day | ORAL | 1 refills | Status: DC

## 2022-06-19 MED ORDER — GUAIFENESIN ER 600 MG PO TB12: 600.0000 mg | ORAL_TABLET | Freq: Two times a day (BID) | ORAL | 2 refills | Status: DC

## 2022-06-19 MED ORDER — DOXYCYCLINE HYCLATE 100 MG PO TABS: 100.0000 mg | ORAL_TABLET | Freq: Two times a day (BID) | ORAL | 0 refills | Status: AC

## 2022-06-19 NOTE — Progress Notes (Signed)
    SATURATION QUALIFICATIONS: (This note is used to comply with regulatory documentation for home oxygen)   Patient Saturations on Room Air at Rest = 88%   Patient Saturations on Room Air while Ambulating = 85 %   Patient Saturations on 2 Liters of oxygen while Ambulating = 93 %       Patient needs continuous O2 at 2 L/min continuously via nasal cannula with humidifier, with gaseous portability and conserving device    Roxan Hockey, MD

## 2022-06-19 NOTE — TOC Progression Note (Signed)
Transition of Care [facility]) - Progression Note    Patient Details  Name: [name] MRN: [id number] Date of Birth: September 15, 1933  Transition of Care [facility]) CM/SW Contact  Ihor Gully, LCSW Phone Number: 06/19/2022, 12:51 PM  Clinical Narrative:    [facility]ft is willing to accept patient' insurance for HHPT.    Expected Discharge Plan: Home/Self Care Barriers to Discharge: Continued Medical Work up  Expected Discharge Plan and Services Expected Discharge Plan: Home/Self Care       Living arrangements for the past 2 months: Single Family Home                 DME Arranged: Oxygen DME Agency: [facility] Date DME Agency Contacted: 06/18/22 Time DME Agency Contacted: [phone] Representative spoke with at DME Agency: [place]: PT [place]: [place] Date [place]: 06/19/22 Time [place]: 1250 Representative spoke with at [place]: [place] ([place]) Interventions    Readmission Risk Interventions    09/01/2021    3:43 PM 08/05/2021    8:33 AM  Readmission Risk Prevention Plan  Transportation Screening Complete Complete  PCP or Specialist Appt within 3-5 Days Complete   HRI or [place] Complete Complete  Social Work Consult for [place] Planning/Counseling Complete Complete  Palliative Care Screening Not Applicable Not Applicable  Medication Review Press photographer) Complete Complete

## 2022-06-19 NOTE — Progress Notes (Signed)
Patient up in chair when I arrived for my shift. Patient on 2L oxygen via [place]. She was able to ambulate to the bathroom but started to cough when she got settled. She denies pain, shortness of breath or any discomfort.

## 2022-06-19 NOTE — Discharge Instructions (Signed)
1)Very low-salt diet advised 2)Weigh yourself daily, call if you gain more than 3 pounds in 1 day or more than 5 pounds in 1 week as your diuretic medications may need to be adjusted 3)you need oxygen at home at 2 L via nasal cannula continuously while awake and while asleep--- smoking or having open fires around oxygen can cause fire, significant injury and death 4)Repeat BMP and CBC Blood work around Monday 06/23/22

## 2022-06-19 NOTE — Care Management Important Message (Signed)
Important Message  Patient Details  Name: [name] MRN: [id number] Date of Birth: 1933-09-15   Medicare Important Message Given:  N/A - LOS <3 / Initial given by admissions     Tommy Medal 06/19/2022, 4:57 PM

## 2022-06-20 ENCOUNTER — Telehealth: Payer: Self-pay | Admitting: *Deleted

## 2022-06-20 NOTE — Patient Outreach (Signed)
  Care Coordination [facility] Note Transition Care Management Follow-up Telephone Call Date of discharge and from where: 06/19/22 from [facility] How have you been since you were released from the hospital? "I'm doing about the same" Any questions or concerns? No  Items Reviewed: Did the pt receive and understand the discharge instructions provided? Yes  Medications obtained and verified? Yes  Other? No  Any new allergies since your discharge? No  Dietary orders reviewed? Yes Do you have support at home? Yes   Home Care and Equipment/Supplies: Were home health services ordered? yes If so, what is the name of the agency? [facility]  Has the agency set up a time to come to the patient's home? Yes. Reached out to [facility] at [phone] and verified receipt of referral and that they plan to start therapy on 06/23/22. Patient will be contacted on Sun to setup a time. Patient notified.  Were any new equipment or medical supplies ordered?  Yes: Oxygen What is the name of the medical supply agency? [facility] Were you able to get the supplies/equipment? yes Do you have any questions related to the use of the equipment or supplies? No  Functional Questionnaire: (I = Independent and D = Dependent) ADLs: I  Bathing/Dressing- I  Meal Prep- I  Eating- I  Maintaining continence- I  Transferring/Ambulation- I  Managing Meds- I  Follow up appointments reviewed:  PCP Hospital f/u appt confirmed? Yes  Scheduled to see [facility] On Call Provider on 07/04/22 @ 10:35. [place] [facility] f/u appt confirmed? No  Not indicated Are transportation arrangements needed? No  If their condition worsens, is the pt aware to call PCP or go to the Emergency Dept.? Yes Was the patient provided with contact information for the PCP's office or ED? Yes Was to pt encouraged to call back with questions or concerns? Yes  SDOH assessments and interventions completed:   Yes  Care Coordination Interventions Activated:  Yes    Care Coordination Interventions:  Outreached to [place] to verify receipt of order and notified patient that they plan to start therapy on 06/23/22 and will contact her on Sunday to setup a time    Encounter Outcome:  Pt. Visit Completed    Chong Sicilian, BSN, RN-BC RN Care Coordinator [place]: [phone] Main #: [phone]

## 2022-06-23 ENCOUNTER — Telehealth: Payer: Self-pay | Admitting: *Deleted

## 2022-06-23 DIAGNOSIS — J45909 Unspecified asthma, uncomplicated: Secondary | ICD-10-CM | POA: Diagnosis not present

## 2022-06-23 DIAGNOSIS — R911 Solitary pulmonary nodule: Secondary | ICD-10-CM | POA: Diagnosis not present

## 2022-06-23 DIAGNOSIS — M1990 Unspecified osteoarthritis, unspecified site: Secondary | ICD-10-CM | POA: Diagnosis not present

## 2022-06-23 DIAGNOSIS — I130 Hypertensive heart and chronic kidney disease with heart failure and stage 1 through stage 4 chronic kidney disease, or unspecified chronic kidney disease: Secondary | ICD-10-CM | POA: Diagnosis not present

## 2022-06-23 DIAGNOSIS — I701 Atherosclerosis of renal artery: Secondary | ICD-10-CM | POA: Diagnosis not present

## 2022-06-23 DIAGNOSIS — I509 Heart failure, unspecified: Secondary | ICD-10-CM | POA: Diagnosis not present

## 2022-06-23 DIAGNOSIS — I480 Paroxysmal atrial fibrillation: Secondary | ICD-10-CM | POA: Diagnosis not present

## 2022-06-23 DIAGNOSIS — Z9981 Dependence on supplemental oxygen: Secondary | ICD-10-CM | POA: Diagnosis not present

## 2022-06-23 DIAGNOSIS — N183 Chronic kidney disease, stage 3 unspecified: Secondary | ICD-10-CM | POA: Diagnosis not present

## 2022-06-23 DIAGNOSIS — D649 Anemia, unspecified: Secondary | ICD-10-CM | POA: Diagnosis not present

## 2022-06-23 NOTE — Discharge Summary (Signed)
[name], is a 88 y.o. female  DOB 07/23/1934  MRN [id number].  Admission date:  06/16/2022  Admitting Physician  Rolla Plate, DO  Discharge Date:  06/23/2022   Primary MD  [name], DO  Recommendations for primary care physician for things to follow:   1)Very low-salt diet advised 2)Weigh yourself daily, call if you gain more than 3 pounds in 1 day or more than 5 pounds in 1 week as your diuretic medications may need to be adjusted 3)you need oxygen at home at 2 L via nasal cannula continuously while awake and while asleep--- smoking or having open fires around oxygen can cause fire, significant injury and death 4)Repeat BMP and CBC Blood work around Monday 06/23/22  Admission Diagnosis  Acute respiratory failure with hypoxia ([name]) [J96.01]   Discharge Diagnosis  Acute respiratory failure with hypoxia ([name]) [J96.01]    Principal Problem:   Acute respiratory failure with hypoxia ([name]) Active Problems:   Hyperlipidemia   Essential hypertension   Paroxysmal Janet fibrillation ([name])   Hypothyroidism   Acute-on-chronic kidney injury ([name])   GERD (gastroesophageal reflux disease)   Elevated d-dimer      Past Medical History:  Diagnosis Date   Anemia    Arthritis    Asthma    Janet fibrillation ([name])    Basal cell carcinoma 02/06/2009   Left ear targus- ([facility])   Basal cell carcinoma 09/28/2008   Right back-([id number])   CKD (chronic kidney disease), stage III ([name])    Heart murmur    Hyperlipidemia    Hypertension    Hypothyroidism    Nodule of right lung    Right upper lobe   PONV (postoperative nausea and vomiting)    Renal insufficiency    Chronic   Renal vascular disease    Right renal artery stenosis ([name]) 05/09/2015   Squamous cell carcinoma of skin 09/07/2019   KA-Left shin-txpbx   Vertigo     Past Surgical History:  Procedure Laterality Date    BIOPSY  04/09/2021   Procedure: BIOPSY;  Surgeon: Harvel Quale, MD;  [facility];  Service: Gastroenterology;;   BREAST LUMPECTOMY WITH RADIOACTIVE SEED LOCALIZATION Right 04/08/2018   Procedure: BREAST LUMPECTOMY WITH RADIOACTIVE SEED LOCALIZATION;  Surgeon: Rolm Bookbinder, MD;  Location: [facility];  Service: General;  Laterality: Right;   COLONOSCOPY  2016   diverticulosis, hemorrhoids   ESOPHAGOGASTRODUODENOSCOPY  2016   normal   ESOPHAGOGASTRODUODENOSCOPY (EGD) WITH PROPOFOL N/A 04/09/2021   Procedure: ESOPHAGOGASTRODUODENOSCOPY (EGD) WITH PROPOFOL;  Surgeon: Harvel Quale, MD;  [facility];  Service: Gastroenterology;  Laterality: N/A;  12:45   ESOPHAGOGASTRODUODENOSCOPY (EGD) WITH PROPOFOL N/A 09/01/2021   Procedure: ESOPHAGOGASTRODUODENOSCOPY (EGD) WITH PROPOFOL;  Surgeon: Eloise Harman, DO;  Location[facility];  Service: Endoscopy;  Laterality: N/A;   IR GENERIC HISTORICAL  08/29/2016   IR US GUIDE VASC ACCESS RIGHT 08/29/2016 [facility]   IR GENERIC HISTORICAL  08/29/2016   IR RENAL BILAT S&I MOD SED  08/29/2016 [facility]   KNEE ARTHROSCOPY Right    2019   THYROIDECTOMY, PARTIAL Right 1981   middle lobe removed 1st, then right lobe removed 7-8 years later (approx. 1988)       HPI  from the history and physical done on the day of admission:   HPI: [name] is a 88 y.o. female with medical history significant of Amio, Janet fibrillation on flecainide, CKD, heart murmur, hyperlipidemia, hypothyroidism, renal insufficiency, renal artery stenosis, and more presents the ED with a chief complaint of cough and dyspnea.  Patient reports that started about a week ago.  She notes that it started shortly after she received her flu shot.  She has had associated generalized weakness and fatigue.  Patient denies orthopnea.  She denies chest pain.  Patient reports her cough is productive of white sputum.  She describes it as white  and frothy.  She has not had any fever.  Her dyspnea is worse on exertion.  She does not wear oxygen at baseline.  Patient has no other complaints at this time.   Patient does not smoke, does not drink, does not use illicit drugs.  She is not vaccinated for COVID.  Patient would not want to be left on life support Review of Systems: As mentioned in the history of present illness. All other systems reviewed and are negative.    Hospital Course:    Brief Narrative:  88 y.o. female with medical history significant of Amio, Janet fibrillation on flecainide, CKD, heart murmur, hyperlipidemia, hypothyroidism, renal insufficiency, renal artery stenosis admitted on 06/17/22 with acute hypoxic respiratory failure secondary to pneumonia   -Assessment and Plan: 1) Acute respiratory failure with hypoxia due to pneumonia and CHF exacerbation - PCT 5.55 >> 0.19 -WBC initially normalized, but now trended back up due to steroids -Pulmonary embolism ruled out -Currently weaned down to 2  L of oxygen via nasal cannula -Respiratory status has improved significantly after treatment with azithromycin and Rocephin -Okay to discharge on prednisone, Keflex and doxycycline -Continue mucolytics and bronchodilators -Hypoxia did not quite resolve--- okay to discharge home on 2 L of oxygen via nasal cannula anticipate the patient can come off oxygen and 1 to 2 months hopefully -Repeat CBC with PCP on 06/23/2022--- leukocytosis may persist due to ongoing steroid therapy   2)HFpEF--acute on chronic systolic dysfunction CHF -Admission chest x-ray suggestive of pulmonary venous congestion -BNP elevated at almost 600 -Echo from July 2023 with EF of 65 to 70% with grade 2 diastolic dysfunction, mild pulmonary hypertension and moderate aortic stenosis -Improved significantly with Lasix and treatment as above #1 -Okay to discharge on oral Lasix    3)Acute-on-chronic kidney injury ([name]) - Creatinine at baseline 1.59 -  Creatinine down to 1.6  from 1.93 -renally adjust medications, avoid nephrotoxic agents / dehydration  / hypotension --Repeat BMP on 06/23/2022--especially with ongoing Lasix therapy and borderline high potassium of 5.2   4)Mild Hyperkalemia----manage as above #3 -Repeat BMP with PCP on 06/23/2022  5)Hypothyroidism - Continue Synthroid   6)Paroxysmal Janet fibrillation ([name]) - Continue flecainide and bisoprolol for rate control -Patient is not on anticoagulation   Essential hypertension - Continue Zebeta, hydralazine, losartan   Hyperlipidemia - Continue statin  GERD (gastroesophageal reflux disease) - Continue Protonix patient while on steroids     -Generalized weakness and deconditioning--- physical therapy recommends home health PT upon discharge -Discharge home with home PT   Disposition: The patient is from: Home  Anticipated d/c is to: [facility] with [facility]  Discharge Condition: Stable  Follow UP--- PCP for repeat BMP and CBC on 06/23/2022  Diet and Activity recommendation:  As advised  Discharge Instructions     Discharge Instructions     Call MD for:  difficulty breathing, headache or visual disturbances   Complete by: As directed    Call MD for:  persistant dizziness or light-headedness   Complete by: As directed    Call MD for:  persistant nausea and vomiting   Complete by: As directed    Call MD for:  severe uncontrolled pain   Complete by: As directed    Call MD for:  temperature >100.4   Complete by: As directed    Diet - low sodium heart healthy   Complete by: As directed    Discharge instructions   Complete by: As directed    1)Very low-salt diet advised 2)Weigh yourself daily, call if you gain more than 3 pounds in 1 day or more than 5 pounds in 1 week as your diuretic medications may need to be adjusted 3)you need oxygen at home at 2 L via nasal cannula continuously while awake and while asleep--- smoking or having open fires around oxygen  can cause fire, significant injury and death 4)Repeat BMP and CBC Blood work around Monday 06/23/22   Increase activity slowly   Complete by: As directed         Discharge Medications     Allergies as of 06/19/2022       Reactions   Amlodipine Other (See Comments)   edema   Lisinopril    cough   Xeroform Occlusive Gauze Strip [bismuth Tribromoph-petrolatum]    Isosorbide Nitrate Nausea Only   Sulfonamide Derivatives Nausea Only, Rash   Terazosin Hives, Nausea Only        Medication List     TAKE these medications    acetaminophen 325 MG tablet Commonly known as: TYLENOL Take 325 mg by mouth daily as needed for moderate pain or headache.   atorvastatin 20 MG tablet Commonly known as: LIPITOR TAKE 1/2 TABLET BY MOUTH EVERY DAY   bisoprolol 5 MG tablet Commonly known as: ZEBETA Take 1 tablet (5 mg total) by mouth daily.   cephALEXin 500 MG capsule Commonly known as: Keflex Take 1 capsule (500 mg total) by mouth 3 (three) times daily for 5 days.   cholecalciferol 1000 units tablet Commonly known as: VITAMIN D Take 1,000 Units by mouth daily.   doxycycline 100 MG tablet Commonly known as: VIBRA-TABS Take 1 tablet (100 mg total) by mouth 2 (two) times daily for 5 days.   ferrous sulfate 325 (65 FE) MG tablet Take 325 mg by mouth 2 (two) times daily with a meal.   flecainide 50 MG tablet Commonly known as: TAMBOCOR Take 1 tablet (50 mg total) by mouth 2 (two) times daily.   furosemide 20 MG tablet Commonly known as: Lasix Take 1 tablet (20 mg total) by mouth daily.   guaiFENesin 600 MG 12 hr tablet Commonly known as: Mucinex Take 1 tablet (600 mg total) by mouth 2 (two) times daily.   hydrALAZINE 100 MG tablet Commonly known as: APRESOLINE Take 100 mg by mouth 2 (two) times daily.   hydroxypropyl methylcellulose / hypromellose 2.5 % ophthalmic solution Commonly known as: ISOPTO TEARS / GONIOVISC Place 1 drop into both eyes daily.   letrozole 2.5  MG tablet Commonly known as: FEMARA TAKE 1 TABLET BY MOUTH EVERY DAY  levocetirizine 5 MG tablet Commonly known as: XYZAL Take 1 tablet (5 mg total) by mouth daily as needed for allergies (rhinitis).   levothyroxine 75 MCG tablet Commonly known as: SYNTHROID TAKE 1 TABLET BY MOUTH EVERY DAY   losartan 25 MG tablet Commonly known as: COZAAR Take 1 tablet (25 mg total) by mouth daily.   mirtazapine 7.5 MG tablet Commonly known as: REMERON TAKE 1 TABLET BY MOUTH AT BEDTIME.   multivitamin tablet Take 1 tablet by mouth daily.   OVER THE COUNTER MEDICATION calcium   pantoprazole 40 MG tablet Commonly known as: Protonix Take 1 tablet (40 mg total) by mouth daily.   predniSONE 20 MG tablet Commonly known as: DELTASONE Take 2 tablets (40 mg total) by mouth daily with breakfast for 5 days.        Major procedures and Radiology Reports - PLEASE review detailed and final reports for all details, in brief -   NM Pulmonary Perfusion  Result Date: 06/17/2022 CLINICAL DATA:  Shortness of breath, bronchitis, history asthma, hypertension, breast cancer, GERD, Janet fibrillation, CHF, chronic kidney disease stage III EXAM: NUCLEAR MEDICINE PERFUSION LUNG SCAN TECHNIQUE: Perfusion images were obtained in multiple projections after intravenous injection of radiopharmaceutical. Ventilation scans intentionally deferred if perfusion scan and chest x-ray adequate for interpretation during COVID 19 epidemic. RADIOPHARMACEUTICALS:  4.4 mCi Tc-17mMAA IV COMPARISON:  Chest radiograph 06/16/2022 FINDINGS: Peripheral irregularity of perfusion throughout both lungs. Several small subsegmental perfusion defects within periphery of RIGHT middle lobe. No moderate to large subsegmental perfusion defects. Chest radiograph demonstrates emphysematous changes and scattered interstitial lung disease changes with scattered interstitial infiltrates, greatest in RIGHT middle lobe. Overall appearance is most  suggestive of parenchymal lung disease rather than pulmonary embolism. IMPRESSION: Scattered peripheral perfusion changes particularly lateral RIGHT middle lobe, pattern most consistent with parenchymal lung disease and the emphysematous changes and interstitial infiltrates seen on chest radiograph. Overall pattern is not suggestive pulmonary embolism. Electronically Signed   By: MLavonia DanaM.D.   On: 06/17/2022 11:50   DG Chest 2 View  Result Date: 06/16/2022 CLINICAL DATA:  Shortness of breath. EXAM: CHEST - 2 VIEW COMPARISON:  Chest radiograph dated 08/31/2021. FINDINGS: Background of emphysema. There is diffuse interstitial reticulonodular densities which may represent congestion and edema or pneumonia. Clinical correlation is recommended. No pleural effusion or pneumothorax. Stable mild cardiomegaly. Atherosclerotic calcification of the aorta. No acute osseous pathology. IMPRESSION: Diffuse interstitial densities may represent congestion and edema or pneumonia. Electronically Signed   By: AAnner CreteM.D.   On: 06/16/2022 20:11    Micro Results   Recent Results (from the past 240 hour(s))  SARS Coronavirus 2 by RT PCR [facility] *cepheid single result test* Anterior Nasal Swab     Status: None   Collection Time: 06/16/22  7:49 PM   Specimen: Anterior Nasal Swab  Result Value Ref Range Status   SARS Coronavirus 2 by RT PCR NEGATIVE NEGATIVE Final    Comment: (NOTE) SARS-CoV-2 target nucleic acids are NOT DETECTED.  The SARS-CoV-2 RNA is generally detectable in upper and lower respiratory specimens during the acute phase of infection. The lowest concentration of SARS-CoV-2 viral copies this assay can detect is 250 copies / mL. A negative result does not preclude SARS-CoV-2 infection and should not be used as the sole basis for treatment or other patient management decisions.  A negative result may occur with improper specimen collection /  handling, submission of specimen other than  nasopharyngeal swab, presence of viral mutation(s) within the areas targeted by this assay, and inadequate number of viral copies (<250 copies / mL). A negative result must be combined with clinical observations, patient history, and epidemiological information.  Fact Sheet for Patients:   https://www.patel.info/  Fact Sheet for Healthcare Providers: https://hall.com/  This test is not yet approved or  cleared by the [place] FDA and has been authorized for detection and/or diagnosis of SARS-CoV-2 by FDA under an Emergency Use Authorization (EUA).  This EUA will remain in effect (meaning this test can be used) for the duration of the COVID-19 declaration under Section 564(b)(1) of the Act, 21 U.S.C. section 360bbb-3(b)(1), unless the authorization is terminated or revoked sooner.  Performed at [facility], [address] [place], [place] [address]     Today   Subjective    Vikkie Goeden today has no new complaints -Voiding well -Respiratory status has improved significantly -Still requiring some oxygen with ambulation especially -   Patient has been seen and examined prior to discharge   Objective   Blood pressure (!) 184/67, pulse 73, temperature 98 F (36.7 C), temperature source Oral, resp. rate 19, height '5\' 6"'$  (1.676 m), weight 49.9 kg, SpO2 94 %.   Exam Gen:- Awake Alert, speaking in complete sentences HEENT:- [place].AT, No sclera icterus Nose- 2L/min Neck-Supple Neck,No JVD,.  Lungs-improved air movement, no wheezing CV- S1, S2 normal, regular , 3/6 sm Abd-  +ve B.Sounds, Abd Soft, No tenderness,    Extremity/Skin:-Improved edema, pedal pulses present  Psych-affect is appropriate, oriented x3 Neuro-generalized weakness, no new focal deficits, no tremors   Data Review   CBC w Diff:  Lab Results  Component Value Date   WBC 12.4 (H) 06/19/2022   HGB 8.2 (L) 06/19/2022    HGB 10.1 (L) 12/03/2021   HCT 25.4 (L) 06/19/2022   HCT 30.5 (L) 12/03/2021   PLT 100 (L) 06/19/2022   PLT 90 (LL) 12/03/2021   LYMPHOPCT 5 06/19/2022   MONOPCT 1 06/19/2022   EOSPCT 0 06/19/2022   BASOPCT 0 06/19/2022    CMP:  Lab Results  Component Value Date   NA 135 06/19/2022   NA 141 12/03/2021   K 5.2 (H) 06/19/2022   CL 97 (L) 06/19/2022   CO2 30 06/19/2022   BUN 34 (H) 06/19/2022   BUN 24 12/03/2021   CREATININE 1.67 (H) 06/19/2022   CREATININE 1.57 (H) 03/03/2018   CREATININE 1.37 (H) 02/22/2013   PROT 6.2 (L) 06/17/2022   PROT 6.7 12/03/2021   ALBUMIN 3.1 (L) 06/17/2022   ALBUMIN 4.3 12/03/2021   BILITOT 0.6 06/17/2022   BILITOT 0.4 12/03/2021   BILITOT 0.5 03/03/2018   ALKPHOS 61 06/17/2022   AST 19 06/17/2022   AST 19 03/03/2018   ALT 13 06/17/2022   ALT 14 03/03/2018  .  Total Discharge time is about 33 minutes  Roxan Hockey M.D on 06/23/2022 at 6:42 PM  Go to [email or web address] -  for contact info  [facility] - Office  [phone]

## 2022-06-23 NOTE — Telephone Encounter (Signed)
Unfortunately, I'm not totally sure.  Doesn't look like the dc note has been completed yet.

## 2022-06-23 NOTE — Telephone Encounter (Signed)
TC from [place] PT w/ [facility] Pt discharged from [facility] on 06/19/22 on Lasix not sure why, she is about to finish prednisone  She is wanting stop the Lasix, she has no edema Please advise, you can call Magda Paganini back or pt

## 2022-06-25 DIAGNOSIS — J9611 Chronic respiratory failure with hypoxia: Secondary | ICD-10-CM | POA: Diagnosis not present

## 2022-06-25 DIAGNOSIS — I509 Heart failure, unspecified: Secondary | ICD-10-CM | POA: Diagnosis not present

## 2022-06-25 DIAGNOSIS — Z9981 Dependence on supplemental oxygen: Secondary | ICD-10-CM | POA: Diagnosis not present

## 2022-06-25 DIAGNOSIS — Z515 Encounter for palliative care: Secondary | ICD-10-CM | POA: Diagnosis not present

## 2022-06-25 NOTE — Telephone Encounter (Signed)
They felt she was having low O2 due to CHF so yes, continue lasix as directed.  Does she have a f/u with Cardiology or [place] soon?

## 2022-06-25 NOTE — Telephone Encounter (Signed)
Pt scheduled with cardio in January- both pt and leslie are aware

## 2022-06-25 NOTE — Telephone Encounter (Signed)
Can we re-look at this, discharge summary is there.

## 2022-06-30 ENCOUNTER — Telehealth: Payer: Self-pay | Admitting: Family Medicine

## 2022-06-30 DIAGNOSIS — N1832 Chronic kidney disease, stage 3b: Secondary | ICD-10-CM

## 2022-06-30 DIAGNOSIS — E034 Atrophy of thyroid (acquired): Secondary | ICD-10-CM

## 2022-06-30 NOTE — Telephone Encounter (Signed)
Pt stopping by in the morning for labs- can you place orders you want done?

## 2022-06-30 NOTE — Telephone Encounter (Signed)
done

## 2022-06-30 NOTE — Telephone Encounter (Signed)
Patient calling to find out of she needs to have lab work for her appt on 11/17. Please call back and advise.

## 2022-06-30 NOTE — Addendum Note (Signed)
Addended by: [name] on: 06/30/2022 01:27 PM   Modules accepted: Orders

## 2022-06-30 NOTE — Telephone Encounter (Signed)
yes

## 2022-07-01 ENCOUNTER — Other Ambulatory Visit: Payer: PPO

## 2022-07-01 DIAGNOSIS — N1832 Chronic kidney disease, stage 3b: Secondary | ICD-10-CM

## 2022-07-01 DIAGNOSIS — E034 Atrophy of thyroid (acquired): Secondary | ICD-10-CM

## 2022-07-02 LAB — CMP14+EGFR
ALT: 13 IU/L (ref 0–32)
AST: 20 IU/L (ref 0–40)
Albumin/Globulin Ratio: 1.5 (ref 1.2–2.2)
Albumin: 3.9 g/dL (ref 3.7–4.7)
Alkaline Phosphatase: 61 IU/L (ref 44–121)
BUN/Creatinine Ratio: 15 (ref 12–28)
BUN: 23 mg/dL (ref 8–27)
Bilirubin Total: 0.4 mg/dL (ref 0.0–1.2)
CO2: 29 mmol/L (ref 20–29)
Calcium: 9.2 mg/dL (ref 8.7–10.3)
Chloride: 99 mmol/L (ref 96–106)
Creatinine, Ser: 1.52 mg/dL — ABNORMAL HIGH (ref 0.57–1.00)
Globulin, Total: 2.6 g/dL (ref 1.5–4.5)
Glucose: 103 mg/dL — ABNORMAL HIGH (ref 70–99)
Potassium: 4.6 mmol/L (ref 3.5–5.2)
Sodium: 140 mmol/L (ref 134–144)
Total Protein: 6.5 g/dL (ref 6.0–8.5)
eGFR: 33 mL/min/{1.73_m2} — ABNORMAL LOW (ref >59)

## 2022-07-02 LAB — CBC
Hematocrit: 29.1 % — ABNORMAL LOW (ref 34.0–46.6)
Hemoglobin: 9.3 g/dL — ABNORMAL LOW (ref 11.1–15.9)
MCH: 28.3 pg (ref 26.6–33.0)
MCHC: 32 g/dL (ref 31.5–35.7)
MCV: 88 fL (ref 79–97)
Platelets: 84 10*3/uL — CL (ref 150–450)
RBC: 3.29 x10E6/uL — ABNORMAL LOW (ref 3.77–5.28)
RDW: 14.4 % (ref 11.7–15.4)
WBC: 2.4 10*3/uL — CL (ref 3.4–10.8)

## 2022-07-02 LAB — T4, FREE: Free T4: 1.68 ng/dL (ref 0.82–1.77)

## 2022-07-02 LAB — TSH: TSH: 3.77 u[IU]/mL (ref 0.450–4.500)

## 2022-07-04 ENCOUNTER — Ambulatory Visit (INDEPENDENT_AMBULATORY_CARE_PROVIDER_SITE_OTHER): Payer: PPO | Admitting: Family Medicine

## 2022-07-04 ENCOUNTER — Encounter: Payer: Self-pay | Admitting: Family Medicine

## 2022-07-04 VITALS — BP 132/60 | HR 62 | Temp 97.8°F | Ht 66.0 in | Wt 113.4 lb

## 2022-07-04 DIAGNOSIS — I5032 Chronic diastolic (congestive) heart failure: Secondary | ICD-10-CM | POA: Diagnosis not present

## 2022-07-04 DIAGNOSIS — D696 Thrombocytopenia, unspecified: Secondary | ICD-10-CM | POA: Diagnosis not present

## 2022-07-04 DIAGNOSIS — Z09 Encounter for follow-up examination after completed treatment for conditions other than malignant neoplasm: Secondary | ICD-10-CM

## 2022-07-04 DIAGNOSIS — E034 Atrophy of thyroid (acquired): Secondary | ICD-10-CM

## 2022-07-04 DIAGNOSIS — D72819 Decreased white blood cell count, unspecified: Secondary | ICD-10-CM | POA: Diagnosis not present

## 2022-07-04 DIAGNOSIS — N1832 Chronic kidney disease, stage 3b: Secondary | ICD-10-CM | POA: Diagnosis not present

## 2022-07-04 DIAGNOSIS — J9601 Acute respiratory failure with hypoxia: Secondary | ICD-10-CM | POA: Diagnosis not present

## 2022-07-04 NOTE — Progress Notes (Signed)
Subjective: CC: Hospital discharge follow-up PCP: [name], DO [name] [name] is a 88 y.o. female presenting to clinic today for:  1.  Acute respiratory failure secondary to pneumonia and CHF Patient was discharged on Lasix for CHF exacerbation but she notes that she discontinued it several days ago because she was not having any fluid overload.  She has been monitoring her weights very closely and she has not fluctuated more than 3 pounds.  Denies any ongoing dyspnea.  No hemoptysis.  No fevers.  No chest pain.  She has been utilizing oxygen only with activity, specifically when home health physical therapy comes to see her.  She monitors her oxygen at home and she has not seen any values less than 88% either.   ROS: Per HPI  Allergies  Allergen Reactions   Amlodipine Other (See Comments)    edema   Lisinopril     cough   Xeroform Occlusive Gauze Strip [Bismuth Tribromoph-Petrolatum]    Isosorbide Nitrate Nausea Only   Sulfonamide Derivatives Nausea Only and Rash   Terazosin Hives and Nausea Only   Past Medical History:  Diagnosis Date   Anemia    Arthritis    Asthma    Atrial fibrillation ([place])    Basal cell carcinoma 02/06/2009   Left ear targus- ([facility])   Basal cell carcinoma 09/28/2008   Right back-(CX35FU)   CKD (chronic kidney disease), stage III (HCC)    Heart murmur    Hyperlipidemia    Hypertension    Hypothyroidism    Nodule of right lung    Right upper lobe   PONV (postoperative nausea and vomiting)    Renal insufficiency    Chronic   Renal vascular disease    Right renal artery stenosis (HCC) 05/09/2015   Squamous cell carcinoma of skin 09/07/2019   KA-Left shin-txpbx   Vertigo     Current Outpatient Medications:    acetaminophen (TYLENOL) 325 MG tablet, Take 325 mg by mouth daily as needed for moderate pain or headache., Disp: , Rfl:    atorvastatin (LIPITOR) 20 MG tablet, TAKE 1/2 TABLET BY MOUTH EVERY DAY, Disp: 45 tablet, Rfl: 1    bisoprolol (ZEBETA) 5 MG tablet, Take 1 tablet (5 mg total) by mouth daily., Disp: 90 tablet, Rfl: 3   cholecalciferol (VITAMIN D) 1000 units tablet, Take 1,000 Units by mouth daily., Disp: , Rfl:    ferrous sulfate 325 (65 FE) MG tablet, Take 325 mg by mouth 2 (two) times daily with a meal., Disp: , Rfl:    flecainide (TAMBOCOR) 50 MG tablet, Take 1 tablet (50 mg total) by mouth 2 (two) times daily., Disp: 180 tablet, Rfl: 3   furosemide (LASIX) 20 MG tablet, Take 1 tablet (20 mg total) by mouth daily., Disp: 30 tablet, Rfl: 1   guaiFENesin (MUCINEX) 600 MG 12 hr tablet, Take 1 tablet (600 mg total) by mouth 2 (two) times daily., Disp: 60 tablet, Rfl: 2   hydrALAZINE (APRESOLINE) 100 MG tablet, Take 100 mg by mouth 2 (two) times daily., Disp: , Rfl:    hydroxypropyl methylcellulose / hypromellose (ISOPTO TEARS / GONIOVISC) 2.5 % ophthalmic solution, Place 1 drop into both eyes daily., Disp: , Rfl:    letrozole (FEMARA) 2.5 MG tablet, TAKE 1 TABLET BY MOUTH EVERY DAY, Disp: 90 tablet, Rfl: 4   levocetirizine (XYZAL) 5 MG tablet, Take 1 tablet (5 mg total) by mouth daily as needed for allergies (rhinitis)., Disp: 30 tablet, Rfl: 0  levothyroxine (SYNTHROID) 75 MCG tablet, TAKE 1 TABLET BY MOUTH EVERY DAY, Disp: 90 tablet, Rfl: 3   losartan (COZAAR) 25 MG tablet, Take 1 tablet (25 mg total) by mouth daily., Disp: 90 tablet, Rfl: 3   mirtazapine (REMERON) 7.5 MG tablet, TAKE 1 TABLET BY MOUTH AT BEDTIME., Disp: 90 tablet, Rfl: 1   Multiple Vitamin (MULTIVITAMIN) tablet, Take 1 tablet by mouth daily., Disp: , Rfl:    OVER THE COUNTER MEDICATION, calcium, Disp: , Rfl:    OXYGEN, Inhale 2 L/min into the lungs as needed., Disp: , Rfl:    pantoprazole (PROTONIX) 40 MG tablet, Take 1 tablet (40 mg total) by mouth daily., Disp: 90 tablet, Rfl: 3 Social History   Socioeconomic History   Marital status: Widowed    Spouse name: Not on file   Number of children: 2   Years of education: 43   Highest  education level: Not on file  Occupational History   Occupation: Retired  Tobacco Use   Smoking status: Never   Smokeless tobacco: Never  Vaping Use   Vaping Use: Never used  Substance and Sexual Activity   Alcohol use: No   Drug use: No   Sexual activity: Never  Other Topics Concern   Not on file  Social History Narrative   Lives alone, but her daughter stays there at night   Ambidextrous (writes with left hand).   No caffeine use.   Social Determinants of Health   Financial Resource Strain: Low Risk  (06/20/2022)   Overall Financial Resource Strain (CARDIA)    Difficulty of Paying Living Expenses: Not hard at all  Food Insecurity: No Food Insecurity (06/16/2022)   Hunger Vital Sign    Worried About Running Out of Food in the Last Year: Never true    Ran Out of Food in the Last Year: Never true  Transportation Needs: No Transportation Needs (06/20/2022)   PRAPARE - Hydrologist (Medical): No    Lack of Transportation (Non-Medical): No  Physical Activity: Inactive (04/26/2021)   Exercise Vital Sign    Days of Exercise per Week: 0 days    Minutes of Exercise per Session: 0 min  Stress: No Stress Concern Present (04/26/2021)   [place]    Feeling of Stress : Not at all  Social Connections: Moderately Isolated (04/26/2021)   Social Connection and Isolation Panel [NHANES]    Frequency of Communication with Friends and Family: More than three times a week    Frequency of Social Gatherings with Friends and Family: More than three times a week    Attends Religious Services: More than 4 times per year    Active Member of [employer] or Organizations: No    Attends Archivist Meetings: Never    Marital Status: Widowed  Intimate Partner Violence: Not At Risk (06/16/2022)   Humiliation, Afraid, Rape, and Kick questionnaire    Fear of Current or Ex-Partner: No    Emotionally Abused: No     Physically Abused: No    Sexually Abused: No   Family History  Problem Relation Age of Onset   Thyroid disease Mother    Stroke Father    Hypertension Brother    Lung cancer Maternal Uncle    Cancer Maternal Grandmother        Liver   Lung cancer Maternal Uncle    Hypertension Daughter    Hypercholesterolemia Daughter    Hypercholesterolemia Daughter  Objective: Office vital signs reviewed. BP 132/60 Comment: home BP  Pulse 62   Temp 97.8 F (36.6 C)   Ht '5\' 6"'$  (1.676 m)   Wt 113 lb 6.4 oz (51.4 kg)   SpO2 91%   BMI 18.30 kg/m   Physical Examination:  General: Awake, alert, thin elderly female, No acute distress HEENT:sclera white, MMM Cardio: regular rate and rhythm, S1S2 heard, no murmurs appreciated Pulm: clear to auscultation bilaterally, no wheezes, rhonchi or rales; normal work of breathing on room air MSK: Ambulating independently Extremities: Warm, no edema     Latest Ref Rng & Units 07/01/2022    9:47 AM 06/19/2022    3:33 AM 06/18/2022    3:56 AM  CBC  WBC 3.4 - 10.8 x10E3/uL 2.4  12.4  8.3   Hemoglobin 11.1 - 15.9 g/dL 9.3  8.2  7.9   Hematocrit 34.0 - 46.6 % 29.1  25.4  24.6   Platelets 150 - 450 x10E3/uL 84  100  93       Latest Ref Rng & Units 07/01/2022    9:47 AM 06/19/2022    3:33 AM 06/18/2022    3:56 AM  BMP  Glucose 70 - 99 mg/dL 103  159  112   BUN 8 - 27 mg/dL 23  34  26   Creatinine 0.57 - 1.00 mg/dL 1.52  1.67  1.58   BUN/Creat Ratio 12 - 28 15     Sodium 134 - 144 mmol/L 140  135  134   Potassium 3.5 - 5.2 mmol/L 4.6  5.2  4.1   Chloride 96 - 106 mmol/L 99  97  99   CO2 20 - 29 mmol/L '29  30  30   '$ Calcium 8.7 - 10.3 mg/dL 9.2  8.5  8.1     Assessment/ Plan: 88 y.o. female   Acute respiratory failure with hypoxia [facility])  Hospital discharge follow-up  Stage 3b chronic kidney disease ([place])  Hypothyroidism due to acquired atrophy of thyroid  Chronic diastolic heart failure (HCC)  Leukopenia, unspecified  type  Thrombocytopenia (HCC)  Ambulatory O2 did not demonstrate any drops in oxygen below 89% on room air.  I think that she likely can discontinue supplemental oxygen but I would like her to continue monitoring very closely for the next few days before totally dc'ing  Certainly no evidence of fluid overload on exam.  Her lungs were clear today.  I think okay to hold off on Lasix for now.  We discussed indication for restart.  We reviewed her lab results today.  Handout provided with these results  Thyroid levels are controlled.  CKD stable.  Both leukopenia and thrombocytopenia noted on recent CBC.  I have CCed this to her oncologist/hematologist and recommended that she follow-up  No orders of the defined types were placed in this encounter.  No orders of the defined types were placed in this encounter.    [name], DO [place] [phone]

## 2022-07-05 ENCOUNTER — Other Ambulatory Visit: Payer: Self-pay | Admitting: Family Medicine

## 2022-07-05 DIAGNOSIS — J069 Acute upper respiratory infection, unspecified: Secondary | ICD-10-CM

## 2022-07-14 ENCOUNTER — Ambulatory Visit: Payer: PPO | Admitting: Family Medicine

## 2022-07-15 ENCOUNTER — Ambulatory Visit (INDEPENDENT_AMBULATORY_CARE_PROVIDER_SITE_OTHER): Payer: PPO

## 2022-07-15 DIAGNOSIS — N183 Chronic kidney disease, stage 3 unspecified: Secondary | ICD-10-CM | POA: Diagnosis not present

## 2022-07-15 DIAGNOSIS — I509 Heart failure, unspecified: Secondary | ICD-10-CM | POA: Diagnosis not present

## 2022-07-15 DIAGNOSIS — M1990 Unspecified osteoarthritis, unspecified site: Secondary | ICD-10-CM

## 2022-07-15 DIAGNOSIS — I130 Hypertensive heart and chronic kidney disease with heart failure and stage 1 through stage 4 chronic kidney disease, or unspecified chronic kidney disease: Secondary | ICD-10-CM

## 2022-07-15 DIAGNOSIS — Z9981 Dependence on supplemental oxygen: Secondary | ICD-10-CM

## 2022-07-15 DIAGNOSIS — I480 Paroxysmal atrial fibrillation: Secondary | ICD-10-CM

## 2022-07-15 DIAGNOSIS — D649 Anemia, unspecified: Secondary | ICD-10-CM

## 2022-07-15 DIAGNOSIS — J45909 Unspecified asthma, uncomplicated: Secondary | ICD-10-CM

## 2022-07-15 DIAGNOSIS — R911 Solitary pulmonary nodule: Secondary | ICD-10-CM | POA: Diagnosis not present

## 2022-07-15 DIAGNOSIS — E039 Hypothyroidism, unspecified: Secondary | ICD-10-CM

## 2022-07-15 DIAGNOSIS — I701 Atherosclerosis of renal artery: Secondary | ICD-10-CM

## 2022-07-20 DIAGNOSIS — J9601 Acute respiratory failure with hypoxia: Secondary | ICD-10-CM | POA: Diagnosis not present

## 2022-07-20 DIAGNOSIS — K219 Gastro-esophageal reflux disease without esophagitis: Secondary | ICD-10-CM | POA: Diagnosis not present

## 2022-07-28 ENCOUNTER — Telehealth: Payer: Self-pay | Admitting: Family Medicine

## 2022-07-28 NOTE — Telephone Encounter (Signed)
Written order on providers desk to sign

## 2022-07-29 NOTE — Telephone Encounter (Signed)
Pt aware [name] is off this week and asked that if covering pcp can sign to orders instead. Please to call.

## 2022-07-31 NOTE — Telephone Encounter (Signed)
Pt aware this has been faxed to [facility]

## 2022-08-08 ENCOUNTER — Other Ambulatory Visit: Payer: Self-pay | Admitting: Family Medicine

## 2022-08-08 DIAGNOSIS — I10 Essential (primary) hypertension: Secondary | ICD-10-CM

## 2022-08-11 ENCOUNTER — Other Ambulatory Visit (INDEPENDENT_AMBULATORY_CARE_PROVIDER_SITE_OTHER): Payer: Self-pay | Admitting: Gastroenterology

## 2022-08-11 DIAGNOSIS — R630 Anorexia: Secondary | ICD-10-CM

## 2022-08-12 DIAGNOSIS — M25561 Pain in right knee: Secondary | ICD-10-CM | POA: Diagnosis not present

## 2022-08-13 NOTE — Telephone Encounter (Signed)
Last seen 12/02/21

## 2022-08-24 DIAGNOSIS — I5033 Acute on chronic diastolic (congestive) heart failure: Secondary | ICD-10-CM | POA: Insufficient documentation

## 2022-08-24 NOTE — Progress Notes (Deleted)
Cardiology Office Note   Date:  08/24/2022   ID:  CORRI MASCARI, DOB 1933/10/23, MRN [id number]  PCP:  [name], DO  Cardiologist:   Minus Breeding, MD   No chief complaint on file.     History of Present Illness: [name] is a 89 y.o. female who presents with difficult to control with chronic diastolic CHF, and arrhythmias.  She was recently in the hospital with hypoxic respiratory failure in October.   I reviewed these records for this visit.  She had pneumonia and a CHF exacerbation.   She had mild AKI with her creat being 1.56 at discharge which was down from 1.93.   She was discharged on O2.  ***    1) Acute respiratory failure with hypoxia due to pneumonia and CHF exacerbation - PCT 5.55 >> 0.19 -WBC initially normalized, but now trended back up due to steroids -Pulmonary embolism ruled out -Currently weaned down to 2  L of oxygen via nasal cannula -Respiratory status has improved significantly after treatment with azithromycin and Rocephin -Okay to discharge on prednisone, Keflex and doxycycline -Continue mucolytics and bronchodilators -Hypoxia did not quite resolve--- okay to discharge home on 2 L of oxygen via nasal cannula anticipate the patient can come off oxygen and 1 to 2 months hopefully -Repeat CBC with PCP on 06/23/2022--- leukocytosis may persist due to ongoing steroid therapy   2)HFpEF--acute on chronic systolic dysfunction CHF -Admission chest x-ray suggestive of pulmonary venous congestion -BNP elevated at almost 600 -Echo from July 2023 with EF of 65 to 70% with grade 2 diastolic dysfunction, mild pulmonary hypertension and moderate aortic stenosis -Improved significantly with Lasix and treatment as above #1 -Okay to discharge on oral Lasix     3)Acute-on-chronic kidney injury ([place]) - Creatinine at baseline 1.59 - Creatinine down to 1.6  from 1.93 -renally adjust medications, avoid nephrotoxic agents / dehydration  /  hypotension --Repeat BMP on 06/23/2022--especially with ongoing Lasix therapy and borderline high potassium of 5.2   4)Mild Hyperkalemia----manage as above #3 -Repeat BMP with PCP on 06/23/2022   5)Hypothyroidism - Continue Synthroid   6)Paroxysmal atrial fibrillation (HCC) - Continue flecainide and bisoprolol for rate control -Patient is not on anticoagulation   Essential hypertension - Continue Zebeta, hydralazine, losartan   Hyperlipidemia - Continue statin   GERD (gastroesophageal reflux disease) - Continue Protonix patient while on steroids   ***   I reviewed these records for this visit.    She was treated for bronchitis.  She had chronic diastolic HF.     She says that she is slowly getting better.  She is weak.  She lost about 4 pounds.  She has not had any new shortness of breath, PND or orthopnea.  She has not had any new cough.  She denies any chest pressure, neck or arm discomfort.  She has not really noticed any palpitations, presyncope or syncope   Past Medical History:  Diagnosis Date   Anemia    Arthritis    Asthma    Atrial fibrillation ([place])    Basal cell carcinoma 02/06/2009   Left ear targus- ([facility])   Basal cell carcinoma 09/28/2008   Right back-(CX35FU)   CKD (chronic kidney disease), stage III (HCC)    Heart murmur    Hyperlipidemia    Hypertension    Hypothyroidism    Nodule of right lung    Right upper lobe   PONV (postoperative nausea and vomiting)  Renal insufficiency    Chronic   Renal vascular disease    Right renal artery stenosis ([place]) 05/09/2015   Squamous cell carcinoma of skin 09/07/2019   KA-Left shin-txpbx   Vertigo     Past Surgical History:  Procedure Laterality Date   BIOPSY  04/09/2021   Procedure: BIOPSY;  Surgeon: Harvel Quale, MD;  [facility];  Service: Gastroenterology;;   BREAST LUMPECTOMY WITH RADIOACTIVE SEED LOCALIZATION Right 04/08/2018   Procedure: BREAST LUMPECTOMY WITH RADIOACTIVE  SEED LOCALIZATION;  Surgeon: Rolm Bookbinder, MD;  Location: [place];  Service: General;  Laterality: Right;   COLONOSCOPY  2016   diverticulosis, hemorrhoids   ESOPHAGOGASTRODUODENOSCOPY  2016   normal   ESOPHAGOGASTRODUODENOSCOPY (EGD) WITH PROPOFOL N/A 04/09/2021   Procedure: ESOPHAGOGASTRODUODENOSCOPY (EGD) WITH PROPOFOL;  Surgeon: Harvel Quale, MD;  [facility];  Service: Gastroenterology;  Laterality: N/A;  12:45   ESOPHAGOGASTRODUODENOSCOPY (EGD) WITH PROPOFOL N/A 09/01/2021   Procedure: ESOPHAGOGASTRODUODENOSCOPY (EGD) WITH PROPOFOL;  Surgeon: Eloise Harman, DO;  [facility];  Service: Endoscopy;  Laterality: N/A;   IR GENERIC HISTORICAL  08/29/2016   IR US GUIDE VASC ACCESS RIGHT 08/29/2016 [facility]   IR GENERIC HISTORICAL  08/29/2016   IR RENAL BILAT S&I MOD SED 08/29/2016 [facility]   KNEE ARTHROSCOPY Right    2019   THYROIDECTOMY, PARTIAL Right 1981   middle lobe removed 1st, then right lobe removed 7-8 years later (approx. 1988)     Current Outpatient Medications  Medication Sig Dispense Refill   acetaminophen (TYLENOL) 325 MG tablet Take 325 mg by mouth daily as needed for moderate pain or headache.     atorvastatin (LIPITOR) 20 MG tablet TAKE 1/2 TABLET BY MOUTH EVERY DAY 45 tablet 1   bisoprolol (ZEBETA) 5 MG tablet TAKE 1 TABLET (5 MG TOTAL) BY MOUTH DAILY. 90 tablet 3   cholecalciferol (VITAMIN D) 1000 units tablet Take 1,000 Units by mouth daily.     ferrous sulfate 325 (65 FE) MG tablet Take 325 mg by mouth 2 (two) times daily with a meal.     flecainide (TAMBOCOR) 50 MG tablet Take 1 tablet (50 mg total) by mouth 2 (two) times daily. 180 tablet 3   furosemide (LASIX) 20 MG tablet Take 1 tablet (20 mg total) by mouth daily. 30 tablet 1   guaiFENesin (MUCINEX) 600 MG 12 hr tablet Take 1 tablet (600 mg total) by mouth 2 (two) times daily. 60 tablet 2   hydrALAZINE (APRESOLINE) 100 MG tablet Take 100 mg by mouth 2 (two)  times daily.     hydroxypropyl methylcellulose / hypromellose (ISOPTO TEARS / GONIOVISC) 2.5 % ophthalmic solution Place 1 drop into both eyes daily.     letrozole (FEMARA) 2.5 MG tablet TAKE 1 TABLET BY MOUTH EVERY DAY 90 tablet 4   levocetirizine (XYZAL) 5 MG tablet TAKE 1 TABLET (5 MG TOTAL) BY MOUTH DAILY AS NEEDED FOR ALLERGIES (RHINITIS). 90 tablet 1   levothyroxine (SYNTHROID) 75 MCG tablet TAKE 1 TABLET BY MOUTH EVERY DAY 90 tablet 3   losartan (COZAAR) 25 MG tablet Take 1 tablet (25 mg total) by mouth daily. 90 tablet 3   mirtazapine (REMERON) 7.5 MG tablet TAKE 1 TABLET BY MOUTH EVERYDAY AT BEDTIME 90 tablet 1   Multiple Vitamin (MULTIVITAMIN) tablet Take 1 tablet by mouth daily.     OVER THE COUNTER MEDICATION calcium     OXYGEN Inhale 2 L/min into the lungs as needed.  pantoprazole (PROTONIX) 40 MG tablet Take 1 tablet (40 mg total) by mouth daily. 90 tablet 3   No current facility-administered medications for this visit.    Allergies:   Amlodipine, Lisinopril, Xeroform occlusive gauze strip [bismuth tribromoph-petrolatum], Isosorbide nitrate, Sulfonamide derivatives, and Terazosin    ROS:  Please see the history of present illness.   Otherwise, review of systems are positive for ***.   All other systems are reviewed and negative.    PHYSICAL EXAM: VS:  There were no vitals taken for this visit. , BMI There is no height or weight on file to calculate BMI. GENERAL:  Well appearing NECK:  No jugular venous distention, waveform within normal limits, carotid upstroke brisk and symmetric, no bruits, no thyromegaly LUNGS:  Clear to auscultation bilaterally CHEST:  Unremarkable HEART:  PMI not displaced or sustained,S1 and S2 within normal limits, no S3, no S4, no clicks, no rubs, *** murmurs ABD:  Flat, positive bowel sounds normal in frequency in pitch, no bruits, no rebound, no guarding, no midline pulsatile mass, no hepatomegaly, no splenomegaly EXT:  2 plus pulses  throughout, no edema, no cyanosis no clubbing     ***GENERAL:  Well appearing NECK:  No jugular venous distention, waveform within normal limits, carotid upstroke brisk and symmetric, no bruits, no thyromegaly LUNGS:  Clear to auscultation bilaterally CHEST:  Unremarkable HEART:  PMI not displaced or sustained,S1 and S2 within normal limits, no S3, no S4, no clicks, no rubs, 2 out of 6 apical systolic murmur radiating up aortic outflow tract and early peaking, no diastolic murmurs ABD:  Flat, positive bowel sounds normal in frequency in pitch, no bruits, no rebound, no guarding, no midline pulsatile mass, no hepatomegaly, no splenomegaly EXT:  2 plus pulses throughout, no edema, no cyanosis no clubbing   EKG:  EKG is *** ordered today. The ekg ordered today demonstrates sinus rhythm, rate ***, axis within normal limits, QTC prolonged but stable   Recent Labs: 06/16/2022: B Natriuretic Peptide 547.0 06/17/2022: Magnesium 2.1 07/01/2022: ALT 13; BUN 23; Creatinine, Ser 1.52; Hemoglobin 9.3; Platelets 84; Potassium 4.6; Sodium 140; TSH 3.770    Lipid Panel    Component Value Date/Time   CHOL 127 06/05/2022 0844   CHOL 159 01/13/2013 1042   TRIG 64 06/05/2022 0844   TRIG 84 03/10/2016 0928   TRIG 142 01/13/2013 1042   HDL 53 06/05/2022 0844   HDL 56 03/10/2016 0928   HDL 54 01/13/2013 1042   CHOLHDL 2.4 06/05/2022 0844   CHOLHDL 4.6 06/05/2007 0125   VLDL 54 (H) 06/05/2007 0125   LDLCALC 61 06/05/2022 0844   LDLCALC 76 03/14/2014 0822   LDLCALC 77 01/13/2013 1042      Wt Readings from Last 3 Encounters:  07/04/22 113 lb 6.4 oz (51.4 kg)  06/16/22 110 lb 0.2 oz (49.9 kg)  06/13/22 111 lb 6 oz (50.5 kg)      Other studies Reviewed: Additional studies/ records that were reviewed today include:  *** Review of the above records demonstrates:  Please see elsewhere in the note.      ASSESSMENT AND PLAN:   HFpEF:   ***   She seems to be euvolemic.  *** She will  continue the current regimen.  No change in therapy.   Atrial tachycardia/PAF:   *** The predominant rhythm has been atrial tachycardia.  We are not using anticoagulation because of that.  No change in therapy.  She tolerates flecainide.  HTN : Blood pressure is ***  at target.  No change in therapy.  Thrombocytopenia/leukopenia : She also had effusion during hospitalization.  She is followed routinely by hematology.  Ao Valve stenosis and MR: This was mild in 2019.  ***  I will repeat an echocardiogram.    Current medicines are reviewed at length with the patient today.  The patient does not have concerns regarding medicines.  The following changes have been made: ***  Labs/ tests ordered today include: ***  No orders of the defined types were placed in this encounter.    Disposition:   FU with in *** months.    Signed, Minus Breeding, MD  08/24/2022 1:08 PM    [facility]

## 2022-08-26 ENCOUNTER — Inpatient Hospital Stay (HOSPITAL_COMMUNITY)
Admission: EM | Admit: 2022-08-26 | Discharge: 2022-08-28 | DRG: 193 | Disposition: A | Discharge status: Home / Self Care | Payer: PPO | Attending: Internal Medicine | Admitting: Internal Medicine

## 2022-08-26 ENCOUNTER — Emergency Department (HOSPITAL_COMMUNITY): Payer: PPO

## 2022-08-26 ENCOUNTER — Other Ambulatory Visit: Payer: Self-pay

## 2022-08-26 ENCOUNTER — Encounter (HOSPITAL_COMMUNITY): Payer: Self-pay

## 2022-08-26 DIAGNOSIS — J9601 Acute respiratory failure with hypoxia: Secondary | ICD-10-CM | POA: Diagnosis present

## 2022-08-26 DIAGNOSIS — I130 Hypertensive heart and chronic kidney disease with heart failure and stage 1 through stage 4 chronic kidney disease, or unspecified chronic kidney disease: Secondary | ICD-10-CM | POA: Diagnosis present

## 2022-08-26 DIAGNOSIS — R262 Difficulty in walking, not elsewhere classified: Secondary | ICD-10-CM | POA: Diagnosis present

## 2022-08-26 DIAGNOSIS — D696 Thrombocytopenia, unspecified: Secondary | ICD-10-CM | POA: Diagnosis present

## 2022-08-26 DIAGNOSIS — R634 Abnormal weight loss: Secondary | ICD-10-CM | POA: Diagnosis not present

## 2022-08-26 DIAGNOSIS — Z8349 Family history of other endocrine, nutritional and metabolic diseases: Secondary | ICD-10-CM

## 2022-08-26 DIAGNOSIS — Z85828 Personal history of other malignant neoplasm of skin: Secondary | ICD-10-CM | POA: Diagnosis not present

## 2022-08-26 DIAGNOSIS — Z882 Allergy status to sulfonamides status: Secondary | ICD-10-CM

## 2022-08-26 DIAGNOSIS — J45909 Unspecified asthma, uncomplicated: Secondary | ICD-10-CM | POA: Diagnosis present

## 2022-08-26 DIAGNOSIS — Z79811 Long term (current) use of aromatase inhibitors: Secondary | ICD-10-CM

## 2022-08-26 DIAGNOSIS — Z888 Allergy status to other drugs, medicaments and biological substances status: Secondary | ICD-10-CM

## 2022-08-26 DIAGNOSIS — Z681 Body mass index (BMI) 19 or less, adult: Secondary | ICD-10-CM

## 2022-08-26 DIAGNOSIS — Z8701 Personal history of pneumonia (recurrent): Secondary | ICD-10-CM

## 2022-08-26 DIAGNOSIS — N1832 Chronic kidney disease, stage 3b: Secondary | ICD-10-CM | POA: Diagnosis present

## 2022-08-26 DIAGNOSIS — B338 Other specified viral diseases: Secondary | ICD-10-CM

## 2022-08-26 DIAGNOSIS — K5900 Constipation, unspecified: Secondary | ICD-10-CM | POA: Diagnosis not present

## 2022-08-26 DIAGNOSIS — Z79899 Other long term (current) drug therapy: Secondary | ICD-10-CM | POA: Diagnosis not present

## 2022-08-26 DIAGNOSIS — I480 Paroxysmal atrial fibrillation: Secondary | ICD-10-CM | POA: Diagnosis not present

## 2022-08-26 DIAGNOSIS — I5032 Chronic diastolic (congestive) heart failure: Secondary | ICD-10-CM | POA: Diagnosis not present

## 2022-08-26 DIAGNOSIS — J205 Acute bronchitis due to respiratory syncytial virus: Secondary | ICD-10-CM | POA: Diagnosis not present

## 2022-08-26 DIAGNOSIS — J189 Pneumonia, unspecified organism: Secondary | ICD-10-CM | POA: Diagnosis not present

## 2022-08-26 DIAGNOSIS — E782 Mixed hyperlipidemia: Secondary | ICD-10-CM | POA: Diagnosis present

## 2022-08-26 DIAGNOSIS — I701 Atherosclerosis of renal artery: Secondary | ICD-10-CM | POA: Diagnosis present

## 2022-08-26 DIAGNOSIS — E034 Atrophy of thyroid (acquired): Secondary | ICD-10-CM | POA: Diagnosis not present

## 2022-08-26 DIAGNOSIS — R059 Cough, unspecified: Secondary | ICD-10-CM | POA: Diagnosis not present

## 2022-08-26 DIAGNOSIS — Z8249 Family history of ischemic heart disease and other diseases of the circulatory system: Secondary | ICD-10-CM | POA: Diagnosis not present

## 2022-08-26 DIAGNOSIS — I10 Essential (primary) hypertension: Secondary | ICD-10-CM | POA: Diagnosis present

## 2022-08-26 DIAGNOSIS — Z801 Family history of malignant neoplasm of trachea, bronchus and lung: Secondary | ICD-10-CM | POA: Diagnosis not present

## 2022-08-26 DIAGNOSIS — Z66 Do not resuscitate: Secondary | ICD-10-CM | POA: Diagnosis not present

## 2022-08-26 DIAGNOSIS — Z1152 Encounter for screening for COVID-19: Secondary | ICD-10-CM | POA: Diagnosis not present

## 2022-08-26 DIAGNOSIS — Z79890 Hormone replacement therapy (postmenopausal): Secondary | ICD-10-CM

## 2022-08-26 DIAGNOSIS — Z823 Family history of stroke: Secondary | ICD-10-CM

## 2022-08-26 DIAGNOSIS — E039 Hypothyroidism, unspecified: Secondary | ICD-10-CM | POA: Diagnosis not present

## 2022-08-26 DIAGNOSIS — E785 Hyperlipidemia, unspecified: Secondary | ICD-10-CM | POA: Diagnosis present

## 2022-08-26 DIAGNOSIS — J121 Respiratory syncytial virus pneumonia: Secondary | ICD-10-CM | POA: Diagnosis not present

## 2022-08-26 DIAGNOSIS — C50911 Malignant neoplasm of unspecified site of right female breast: Secondary | ICD-10-CM | POA: Diagnosis present

## 2022-08-26 DIAGNOSIS — J9611 Chronic respiratory failure with hypoxia: Secondary | ICD-10-CM | POA: Diagnosis present

## 2022-08-26 DIAGNOSIS — K219 Gastro-esophageal reflux disease without esophagitis: Secondary | ICD-10-CM | POA: Diagnosis not present

## 2022-08-26 HISTORY — DX: Pneumonia, unspecified organism: J18.9

## 2022-08-26 LAB — CBC
HCT: 32.3 % — ABNORMAL LOW (ref 36.0–46.0)
Hemoglobin: 10.5 g/dL — ABNORMAL LOW (ref 12.0–15.0)
MCH: 28.8 pg (ref 26.0–34.0)
MCHC: 32.5 g/dL (ref 30.0–36.0)
MCV: 88.7 fL (ref 80.0–100.0)
Platelets: 89 10*3/uL — ABNORMAL LOW (ref 150–400)
RBC: 3.64 MIL/uL — ABNORMAL LOW (ref 3.87–5.11)
RDW: 15.1 % (ref 11.5–15.5)
WBC: 3.1 10*3/uL — ABNORMAL LOW (ref 4.0–10.5)
nRBC: 0 % (ref 0.0–0.2)

## 2022-08-26 LAB — COMPREHENSIVE METABOLIC PANEL
ALT: 11 U/L (ref 0–44)
AST: 21 U/L (ref 15–41)
Albumin: 4 g/dL (ref 3.5–5.0)
Alkaline Phosphatase: 64 U/L (ref 38–126)
Anion gap: 10 (ref 5–15)
BUN: 23 mg/dL (ref 8–23)
CO2: 27 mmol/L (ref 22–32)
Calcium: 8.6 mg/dL — ABNORMAL LOW (ref 8.9–10.3)
Chloride: 94 mmol/L — ABNORMAL LOW (ref 98–111)
Creatinine, Ser: 1.69 mg/dL — ABNORMAL HIGH (ref 0.44–1.00)
GFR, Estimated: 29 mL/min — ABNORMAL LOW (ref >60)
Glucose, Bld: 116 mg/dL — ABNORMAL HIGH (ref 70–99)
Potassium: 4 mmol/L (ref 3.5–5.1)
Sodium: 131 mmol/L — ABNORMAL LOW (ref 135–145)
Total Bilirubin: 0.9 mg/dL (ref 0.3–1.2)
Total Protein: 7.1 g/dL (ref 6.5–8.1)

## 2022-08-26 LAB — URINALYSIS, ROUTINE W REFLEX MICROSCOPIC
Bilirubin Urine: NEGATIVE
Cellular Cast, UA: 3
Glucose, UA: NEGATIVE mg/dL
Ketones, ur: NEGATIVE mg/dL
Leukocytes,Ua: NEGATIVE
Nitrite: NEGATIVE
Protein, ur: 100 mg/dL — AB
Specific Gravity, Urine: 1.021 (ref 1.005–1.030)
pH: 5 (ref 5.0–8.0)

## 2022-08-26 LAB — RESP PANEL BY RT-PCR (RSV, FLU A&B, COVID)  RVPGX2
Influenza A by PCR: NEGATIVE
Influenza B by PCR: NEGATIVE
Resp Syncytial Virus by PCR: POSITIVE — AB
SARS Coronavirus 2 by RT PCR: NEGATIVE

## 2022-08-26 NOTE — ED Triage Notes (Addendum)
Daughter reports pt has not had an appetite for the past 4 or 5 days.  Reports has lost 4lb in a week.  Reports nausea, no vomiting.  LBM was today and was normal per pt.  Denies pain but says stomach feels  "weak."  Also reports cough since Sunday.  O2 sat 88% on room air in triage.  Daughter says she is not on o2 at home but o2 sat usually stays around 88-89% with exertion.

## 2022-08-26 NOTE — ED Notes (Signed)
Ambulated pt to bedside commode; ambulated pt back to bed with call light within reach

## 2022-08-26 NOTE — ED Provider Triage Note (Signed)
Emergency Medicine Provider Triage Evaluation Note  [name] , a 89 y.o. female  was evaluated in triage.  Pt complains of generalized weakness/fatigue.  Patient reports decreased p.o. intake over the past week secondary to feelings of nauseousness and not wanting to eat.  Patient reports history of similar symptoms and was diagnosed with stomach ulcers and esophageal candidiasis after egd.  Reports compliant with her at home reflux medications.  States she is also had mild cough over the past several days.  Denies fever, chills, night sweats, chest pain, shortness of breath, abdominal pain, emesis, urinary/vaginal symptoms.  Patient with recent hospital discharge in November on oxygen throughout the day but was discontinued by primary care outpatient.  Patient with 88% on room air oxygen saturation upon arrival to the ED.  Review of Systems  Positive: See above Negative:   Physical Exam  BP (!) 192/70   Pulse 73   Temp 99.3 F (37.4 C)   Resp 20   Ht '5\' 5"'$  (1.651 m)   Wt 48.5 kg   SpO2 99%   BMI 17.81 kg/m  Gen:   Awake, no distress   Resp:  Normal effort  MSK:   Moves extremities without difficulty  Other:    Medical Decision Making  Medically screening exam initiated at 4:26 PM.  Appropriate orders placed.  ILHAM ROUGHTON was informed that the remainder of the evaluation will be completed by another provider, this initial triage assessment does not replace that evaluation, and the importance of remaining in the ED until their evaluation is complete.     Wilnette Kales, [place] 08/26/22 [phone]

## 2022-08-26 NOTE — ED Provider Notes (Signed)
[facility] EMERGENCY DEPARTMENT Provider Note   CSN: [id number] Arrival date & time: 08/26/22  1423     History  Chief Complaint  Patient presents with   decreased appetite    [name] is a 89 y.o. female.  Patient is an 89 year old female with past medical history of atrial fibrillation, CHF, chronic renal insufficiency, hypertension, hyperlipidemia, prior pneumonia.  Patient presenting today for evaluation of weakness, decreased appetite, and weight loss.  This has been worsening over the past several days.  She is having difficulty ambulating secondary to weakness.  Patient brought by daughter for the above symptoms.  There is no fever, chest pain.  She has had some cough which has been nonproductive.  There have been no ill contacts.  The history is provided by the patient.       Home Medications Prior to Admission medications   Medication Sig Start Date End Date Taking? Authorizing Provider  acetaminophen (TYLENOL) 325 MG tablet Take 325 mg by mouth daily as needed for moderate pain or headache.    [provider]  atorvastatin (LIPITOR) 20 MG tablet TAKE 1/2 TABLET BY MOUTH EVERY DAY 07/07/22   Ronnie Doss M, DO  bisoprolol (ZEBETA) 5 MG tablet TAKE 1 TABLET (5 MG TOTAL) BY MOUTH DAILY. 08/08/22   [name], DO  cholecalciferol (VITAMIN D) 1000 units tablet Take 1,000 Units by mouth daily.    [provider]  ferrous sulfate 325 (65 FE) MG tablet Take 325 mg by mouth 2 (two) times daily with a meal.    [provider]  flecainide (TAMBOCOR) 50 MG tablet Take 1 tablet (50 mg total) by mouth 2 (two) times daily. 08/21/21   Minus Breeding, MD  furosemide (LASIX) 20 MG tablet Take 1 tablet (20 mg total) by mouth daily. 06/19/22 06/19/23  Roxan Hockey, MD  guaiFENesin (MUCINEX) 600 MG 12 hr tablet Take 1 tablet (600 mg total) by mouth 2 (two) times daily. 06/19/22 06/19/23  Roxan Hockey, MD  hydrALAZINE (APRESOLINE) 100 MG tablet  Take 100 mg by mouth 2 (two) times daily.    [provider]  hydroxypropyl methylcellulose / hypromellose (ISOPTO TEARS / GONIOVISC) 2.5 % ophthalmic solution Place 1 drop into both eyes daily.    [provider]  letrozole (FEMARA) 2.5 MG tablet TAKE 1 TABLET BY MOUTH EVERY DAY 01/06/22   Derek Jack, MD  levocetirizine (XYZAL) 5 MG tablet TAKE 1 TABLET (5 MG TOTAL) BY MOUTH DAILY AS NEEDED FOR ALLERGIES (RHINITIS). 07/07/22   [name], DO  levothyroxine (SYNTHROID) 75 MCG tablet TAKE 1 TABLET BY MOUTH EVERY DAY 12/12/21   Ronnie Doss M, DO  losartan (COZAAR) 25 MG tablet Take 1 tablet (25 mg total) by mouth daily. 06/19/22 07/19/22  Roxan Hockey, MD  mirtazapine (REMERON) 7.5 MG tablet TAKE 1 TABLET BY MOUTH EVERYDAY AT BEDTIME 08/13/22   Harvel Quale, MD  Multiple Vitamin (MULTIVITAMIN) tablet Take 1 tablet by mouth daily.    [provider]  OVER THE COUNTER MEDICATION calcium    [provider]  OXYGEN Inhale 2 L/min into the lungs as needed.    [provider]  pantoprazole (PROTONIX) 40 MG tablet Take 1 tablet (40 mg total) by mouth daily. 12/02/21   Carlan, Deatra Robinson, NP      Allergies    Amlodipine, Lisinopril, Xeroform occlusive gauze strip [bismuth tribromoph-petrolatum], Isosorbide nitrate, Sulfonamide derivatives, and Terazosin    Review of Systems   Review of  Systems  All other systems reviewed and are negative.   Physical Exam Updated Vital Signs BP (!) 158/59 (BP Location: Right Arm)   Pulse 73   Temp 99.3 F (37.4 C) (Oral)   Resp (!) 23   Ht '5\' 5"'$  (1.651 m)   Wt 48.5 kg   SpO2 99%   BMI 17.81 kg/m  Physical Exam Vitals and nursing note reviewed.  Constitutional:      General: She is not in acute distress.    Appearance: She is well-developed. She is not diaphoretic.  HENT:     Head: Normocephalic and atraumatic.  Cardiovascular:     Rate and Rhythm: Normal rate and regular  rhythm.     Heart sounds: No murmur heard.    No friction rub. No gallop.  Pulmonary:     Effort: Pulmonary effort is normal. No respiratory distress.     Breath sounds: Rales present. No wheezing.     Comments: There are slight rales noted in the right upper lobe. Abdominal:     General: Bowel sounds are normal. There is no distension.     Palpations: Abdomen is soft.     Tenderness: There is no abdominal tenderness.  Musculoskeletal:        General: Normal range of motion.     Cervical back: Normal range of motion and neck supple.     Right lower leg: No edema.     Left lower leg: No edema.  Skin:    General: Skin is warm and dry.  Neurological:     General: No focal deficit present.     Mental Status: She is alert and oriented to person, place, and time.     ED Results / Procedures / Treatments   Labs (all labs ordered are listed, but only abnormal results are displayed) Labs Reviewed  RESP PANEL BY RT-PCR (RSV, FLU A&B, COVID)  RVPGX2 - Abnormal; Notable for the following components:      Result Value   Resp Syncytial Virus by PCR POSITIVE (*)    All other components within normal limits  COMPREHENSIVE METABOLIC PANEL - Abnormal; Notable for the following components:   Sodium 131 (*)    Chloride 94 (*)    Glucose, Bld 116 (*)    Creatinine, Ser 1.69 (*)    Calcium 8.6 (*)    GFR, Estimated 29 (*)    All other components within normal limits  CBC - Abnormal; Notable for the following components:   WBC 3.1 (*)    RBC 3.64 (*)    Hemoglobin 10.5 (*)    HCT 32.3 (*)    Platelets 89 (*)    All other components within normal limits  URINALYSIS, ROUTINE W REFLEX MICROSCOPIC - Abnormal; Notable for the following components:   Color, Urine AMBER (*)    APPearance HAZY (*)    Hgb urine dipstick SMALL (*)    Protein, ur 100 (*)    Bacteria, UA RARE (*)    All other components within normal limits    EKG None  Radiology DG Chest 2 View  Result Date:  08/26/2022 CLINICAL DATA:  Loss of appetite and cough EXAM: CHEST - 2 VIEW COMPARISON:  Radiograph dated 06/16/2022 FINDINGS: Mildly hyperinflated lungs. Increased patchy right upper lobe opacities. No pleural effusion or pneumothorax. Similar enlarged cardiomediastinal silhouette. Surgical clips project over the right breast. IMPRESSION: Increased patchy right upper lobe opacities, suspicious for pneumonia. Electronically Signed   By: Shawn Route.D.  On: 08/26/2022 15:32    Procedures Procedures  {Document cardiac monitor, telemetry assessment procedure when appropriate:1}  Medications Ordered in ED Medications - No data to display  ED Course/ Medical Decision Making/ A&P                           Medical Decision Making Amount and/or Complexity of Data Reviewed Labs: ordered. Radiology: ordered.   ***  {Document critical care time when appropriate:1} {Document review of labs and clinical decision tools ie heart score, Chads2Vasc2 etc:1}  {Document your independent review of radiology images, and any outside records:1} {Document your discussion with family members, caretakers, and with consultants:1} {Document social determinants of health affecting pt's care:1} {Document your decision making why or why not admission, treatments were needed:1} Final Clinical Impression(s) / ED Diagnoses Final diagnoses:  None    Rx / DC Orders ED Discharge Orders     None

## 2022-08-26 NOTE — ED Notes (Signed)
Provided pt 3 packs of graham crackers, peanut butter, and sprite

## 2022-08-27 ENCOUNTER — Ambulatory Visit: Payer: PPO | Admitting: Cardiology

## 2022-08-27 ENCOUNTER — Encounter (HOSPITAL_COMMUNITY): Payer: Self-pay | Admitting: Internal Medicine

## 2022-08-27 DIAGNOSIS — Z1152 Encounter for screening for COVID-19: Secondary | ICD-10-CM | POA: Diagnosis not present

## 2022-08-27 DIAGNOSIS — J9601 Acute respiratory failure with hypoxia: Secondary | ICD-10-CM | POA: Diagnosis present

## 2022-08-27 DIAGNOSIS — I5032 Chronic diastolic (congestive) heart failure: Secondary | ICD-10-CM | POA: Diagnosis present

## 2022-08-27 DIAGNOSIS — E782 Mixed hyperlipidemia: Secondary | ICD-10-CM

## 2022-08-27 DIAGNOSIS — I130 Hypertensive heart and chronic kidney disease with heart failure and stage 1 through stage 4 chronic kidney disease, or unspecified chronic kidney disease: Secondary | ICD-10-CM | POA: Diagnosis present

## 2022-08-27 DIAGNOSIS — J205 Acute bronchitis due to respiratory syncytial virus: Secondary | ICD-10-CM | POA: Diagnosis present

## 2022-08-27 DIAGNOSIS — I10 Essential (primary) hypertension: Secondary | ICD-10-CM

## 2022-08-27 DIAGNOSIS — Z8249 Family history of ischemic heart disease and other diseases of the circulatory system: Secondary | ICD-10-CM | POA: Diagnosis not present

## 2022-08-27 DIAGNOSIS — J121 Respiratory syncytial virus pneumonia: Secondary | ICD-10-CM | POA: Insufficient documentation

## 2022-08-27 DIAGNOSIS — I480 Paroxysmal atrial fibrillation: Secondary | ICD-10-CM

## 2022-08-27 DIAGNOSIS — R262 Difficulty in walking, not elsewhere classified: Secondary | ICD-10-CM | POA: Diagnosis present

## 2022-08-27 DIAGNOSIS — E034 Atrophy of thyroid (acquired): Secondary | ICD-10-CM

## 2022-08-27 DIAGNOSIS — E039 Hypothyroidism, unspecified: Secondary | ICD-10-CM | POA: Diagnosis present

## 2022-08-27 DIAGNOSIS — K5900 Constipation, unspecified: Secondary | ICD-10-CM | POA: Diagnosis present

## 2022-08-27 DIAGNOSIS — I4719 Other supraventricular tachycardia: Secondary | ICD-10-CM

## 2022-08-27 DIAGNOSIS — Z801 Family history of malignant neoplasm of trachea, bronchus and lung: Secondary | ICD-10-CM | POA: Diagnosis not present

## 2022-08-27 DIAGNOSIS — J45909 Unspecified asthma, uncomplicated: Secondary | ICD-10-CM | POA: Diagnosis present

## 2022-08-27 DIAGNOSIS — I701 Atherosclerosis of renal artery: Secondary | ICD-10-CM | POA: Diagnosis present

## 2022-08-27 DIAGNOSIS — I5033 Acute on chronic diastolic (congestive) heart failure: Secondary | ICD-10-CM

## 2022-08-27 DIAGNOSIS — N1832 Chronic kidney disease, stage 3b: Secondary | ICD-10-CM | POA: Diagnosis present

## 2022-08-27 DIAGNOSIS — Z79899 Other long term (current) drug therapy: Secondary | ICD-10-CM | POA: Diagnosis not present

## 2022-08-27 DIAGNOSIS — B338 Other specified viral diseases: Secondary | ICD-10-CM

## 2022-08-27 DIAGNOSIS — Z79811 Long term (current) use of aromatase inhibitors: Secondary | ICD-10-CM | POA: Diagnosis not present

## 2022-08-27 DIAGNOSIS — C50911 Malignant neoplasm of unspecified site of right female breast: Secondary | ICD-10-CM | POA: Diagnosis present

## 2022-08-27 DIAGNOSIS — D696 Thrombocytopenia, unspecified: Secondary | ICD-10-CM | POA: Diagnosis present

## 2022-08-27 DIAGNOSIS — E785 Hyperlipidemia, unspecified: Secondary | ICD-10-CM | POA: Diagnosis present

## 2022-08-27 DIAGNOSIS — Z85828 Personal history of other malignant neoplasm of skin: Secondary | ICD-10-CM | POA: Diagnosis not present

## 2022-08-27 DIAGNOSIS — I350 Nonrheumatic aortic (valve) stenosis: Secondary | ICD-10-CM

## 2022-08-27 DIAGNOSIS — R634 Abnormal weight loss: Secondary | ICD-10-CM | POA: Diagnosis present

## 2022-08-27 DIAGNOSIS — Z66 Do not resuscitate: Secondary | ICD-10-CM | POA: Diagnosis present

## 2022-08-27 DIAGNOSIS — Z681 Body mass index (BMI) 19 or less, adult: Secondary | ICD-10-CM | POA: Diagnosis not present

## 2022-08-27 LAB — CBC WITH DIFFERENTIAL/PLATELET
Abs Immature Granulocytes: 0.01 10*3/uL (ref 0.00–0.07)
Basophils Absolute: 0 10*3/uL (ref 0.0–0.1)
Basophils Relative: 0 %
Eosinophils Absolute: 0 10*3/uL (ref 0.0–0.5)
Eosinophils Relative: 1 %
HCT: 29.8 % — ABNORMAL LOW (ref 36.0–46.0)
Hemoglobin: 9.6 g/dL — ABNORMAL LOW (ref 12.0–15.0)
Immature Granulocytes: 0 %
Lymphocytes Relative: 14 %
Lymphs Abs: 0.4 10*3/uL — ABNORMAL LOW (ref 0.7–4.0)
MCH: 28.6 pg (ref 26.0–34.0)
MCHC: 32.2 g/dL (ref 30.0–36.0)
MCV: 88.7 fL (ref 80.0–100.0)
Monocytes Absolute: 0.5 10*3/uL (ref 0.1–1.0)
Monocytes Relative: 18 %
Neutro Abs: 1.9 10*3/uL (ref 1.7–7.7)
Neutrophils Relative %: 67 %
Platelets: 81 10*3/uL — ABNORMAL LOW (ref 150–400)
RBC: 3.36 MIL/uL — ABNORMAL LOW (ref 3.87–5.11)
RDW: 15.5 % (ref 11.5–15.5)
WBC: 2.8 10*3/uL — ABNORMAL LOW (ref 4.0–10.5)
nRBC: 0 % (ref 0.0–0.2)

## 2022-08-27 LAB — COMPREHENSIVE METABOLIC PANEL
ALT: 10 U/L (ref 0–44)
AST: 19 U/L (ref 15–41)
Albumin: 3.5 g/dL (ref 3.5–5.0)
Alkaline Phosphatase: 56 U/L (ref 38–126)
Anion gap: 10 (ref 5–15)
BUN: 30 mg/dL — ABNORMAL HIGH (ref 8–23)
CO2: 27 mmol/L (ref 22–32)
Calcium: 8.3 mg/dL — ABNORMAL LOW (ref 8.9–10.3)
Chloride: 97 mmol/L — ABNORMAL LOW (ref 98–111)
Creatinine, Ser: 1.81 mg/dL — ABNORMAL HIGH (ref 0.44–1.00)
GFR, Estimated: 27 mL/min — ABNORMAL LOW (ref >60)
Glucose, Bld: 107 mg/dL — ABNORMAL HIGH (ref 70–99)
Potassium: 4 mmol/L (ref 3.5–5.1)
Sodium: 134 mmol/L — ABNORMAL LOW (ref 135–145)
Total Bilirubin: 0.9 mg/dL (ref 0.3–1.2)
Total Protein: 6.2 g/dL — ABNORMAL LOW (ref 6.5–8.1)

## 2022-08-27 LAB — PROCALCITONIN: Procalcitonin: 0.11 ng/mL

## 2022-08-27 LAB — FOLATE: Folate: 35.4 ng/mL

## 2022-08-27 LAB — TSH: TSH: 1.497 u[IU]/mL (ref 0.350–4.500)

## 2022-08-27 LAB — VITAMIN B12: Vitamin B-12: 802 pg/mL (ref 180–914)

## 2022-08-27 LAB — MAGNESIUM: Magnesium: 1.9 mg/dL (ref 1.7–2.4)

## 2022-08-27 MED ORDER — SODIUM CHLORIDE 0.9 % IV SOLN
500.0000 mg | Freq: Once | INTRAVENOUS | Status: AC
  Administered 2022-08-27: 500 mg via INTRAVENOUS
  Filled 2022-08-27: qty 5

## 2022-08-27 MED ORDER — HYDRALAZINE HCL 25 MG PO TABS
100.0000 mg | ORAL_TABLET | Freq: Two times a day (BID) | ORAL | Status: DC
  Administered 2022-08-27 – 2022-08-28 (×4): 100 mg via ORAL
  Filled 2022-08-27 (×4): qty 4

## 2022-08-27 MED ORDER — LETROZOLE 2.5 MG PO TABS
2.5000 mg | ORAL_TABLET | Freq: Every day | ORAL | Status: DC
  Administered 2022-08-27 – 2022-08-28 (×2): 2.5 mg via ORAL
  Filled 2022-08-27 (×4): qty 1

## 2022-08-27 MED ORDER — HEPARIN SODIUM (PORCINE) 5000 UNIT/ML IJ SOLN
5000.0000 [IU] | Freq: Three times a day (TID) | INTRAMUSCULAR | Status: DC
  Filled 2022-08-27 (×2): qty 1

## 2022-08-27 MED ORDER — ACETAMINOPHEN 650 MG RE SUPP: 650.0000 mg | Freq: Four times a day (QID) | RECTAL | Status: DC | PRN

## 2022-08-27 MED ORDER — SODIUM CHLORIDE 0.9 % IV SOLN
INTRAVENOUS | Status: AC
  Administered 2022-08-27: 1000 mL via INTRAVENOUS

## 2022-08-27 MED ORDER — ATORVASTATIN CALCIUM 10 MG PO TABS
10.0000 mg | ORAL_TABLET | Freq: Every day | ORAL | Status: DC
  Administered 2022-08-27 – 2022-08-28 (×2): 10 mg via ORAL
  Filled 2022-08-27 (×2): qty 1

## 2022-08-27 MED ORDER — ONDANSETRON HCL 4 MG/2ML IJ SOLN: 4.0000 mg | Freq: Four times a day (QID) | INTRAMUSCULAR | Status: DC | PRN

## 2022-08-27 MED ORDER — POLYETHYLENE GLYCOL 3350 17 G PO PACK
17.0000 g | PACK | Freq: Every day | ORAL | Status: DC
  Administered 2022-08-27 – 2022-08-28 (×2): 17 g via ORAL
  Filled 2022-08-27 (×2): qty 1

## 2022-08-27 MED ORDER — PANTOPRAZOLE SODIUM 40 MG PO TBEC
40.0000 mg | DELAYED_RELEASE_TABLET | Freq: Every day | ORAL | Status: DC
  Administered 2022-08-27 – 2022-08-28 (×2): 40 mg via ORAL
  Filled 2022-08-27 (×2): qty 1

## 2022-08-27 MED ORDER — MIRTAZAPINE 15 MG PO TABS
7.5000 mg | ORAL_TABLET | Freq: Every day | ORAL | Status: DC
  Administered 2022-08-27 (×2): 7.5 mg via ORAL
  Filled 2022-08-27 (×2): qty 1

## 2022-08-27 MED ORDER — ALBUTEROL SULFATE (2.5 MG/3ML) 0.083% IN NEBU: 2.5000 mg | INHALATION_SOLUTION | RESPIRATORY_TRACT | Status: DC | PRN

## 2022-08-27 MED ORDER — GUAIFENESIN ER 600 MG PO TB12
600.0000 mg | ORAL_TABLET | Freq: Two times a day (BID) | ORAL | Status: DC
  Administered 2022-08-27 – 2022-08-28 (×4): 600 mg via ORAL
  Filled 2022-08-27 (×4): qty 1

## 2022-08-27 MED ORDER — ENSURE ENLIVE PO LIQD
237.0000 mL | Freq: Two times a day (BID) | ORAL | Status: DC
  Administered 2022-08-28: 237 mL via ORAL
  Filled 2022-08-27 (×6): qty 237

## 2022-08-27 MED ORDER — FLECAINIDE ACETATE 50 MG PO TABS
50.0000 mg | ORAL_TABLET | Freq: Two times a day (BID) | ORAL | Status: DC
  Administered 2022-08-27 – 2022-08-28 (×4): 50 mg via ORAL
  Filled 2022-08-27 (×4): qty 1

## 2022-08-27 MED ORDER — BISOPROLOL FUMARATE 5 MG PO TABS
5.0000 mg | ORAL_TABLET | Freq: Every day | ORAL | Status: DC
  Administered 2022-08-27 – 2022-08-28 (×2): 5 mg via ORAL
  Filled 2022-08-27 (×2): qty 1

## 2022-08-27 MED ORDER — MORPHINE SULFATE (PF) 2 MG/ML IV SOLN: 2.0000 mg | INTRAVENOUS | Status: DC | PRN

## 2022-08-27 MED ORDER — ONDANSETRON HCL 4 MG PO TABS: 4.0000 mg | ORAL_TABLET | Freq: Four times a day (QID) | ORAL | Status: DC | PRN

## 2022-08-27 MED ORDER — LEVOTHYROXINE SODIUM 50 MCG PO TABS: 75.0000 ug | ORAL_TABLET | Freq: Every day | ORAL | Status: DC

## 2022-08-27 MED ORDER — HEPARIN SODIUM (PORCINE) 5000 UNIT/ML IJ SOLN: 5000.0000 [IU] | Freq: Three times a day (TID) | INTRAMUSCULAR | Status: DC

## 2022-08-27 MED ORDER — SODIUM CHLORIDE 0.9 % IV SOLN
1.0000 g | Freq: Once | INTRAVENOUS | Status: AC
  Administered 2022-08-27: 1 g via INTRAVENOUS
  Filled 2022-08-27: qty 10

## 2022-08-27 MED ORDER — LEVOTHYROXINE SODIUM 75 MCG PO TABS
75.0000 ug | ORAL_TABLET | Freq: Every day | ORAL | Status: DC
  Administered 2022-08-27 – 2022-08-28 (×2): 75 ug via ORAL
  Filled 2022-08-27: qty 2
  Filled 2022-08-27: qty 1

## 2022-08-27 MED ORDER — ACETAMINOPHEN 325 MG PO TABS: 650.0000 mg | ORAL_TABLET | Freq: Four times a day (QID) | ORAL | Status: DC | PRN

## 2022-08-27 MED ORDER — OXYCODONE HCL 5 MG PO TABS: 5.0000 mg | ORAL_TABLET | ORAL | Status: DC | PRN

## 2022-08-27 NOTE — ED Notes (Signed)
Pt ambulated to the bathroom with standby assist 

## 2022-08-27 NOTE — Assessment & Plan Note (Signed)
-   Does not wear oxygen at home - She is requiring oxygen here - She dropped down into the 80s on room air - This is likely secondary to RSV - Albuterol as needed - Wean off O2 as tolerated - Chest x-ray shows a possible right upper lobe opacity suspicious for pneumonia, but procalcitonin is reassuring that there is not a bacterial infection superimposed - Continue to monitor

## 2022-08-27 NOTE — TOC Initial Note (Signed)
Transition of Care [facility]) - Initial/Assessment Note    Patient Details  Name: [name] MRN: [id number] Date of Birth: June 10, 1934  Transition of Care [facility]) CM/SW Contact:    Salome Arnt, LCSW Phone Number: 08/27/2022, 11:42 AM  Clinical Narrative:  Pt admitted with acute respiratory failure with hypoxia. Assessment completed with pt due to high risk readmission score. Pt reports she lives alone and manages well. Her daughter stays with her at night. Pt said she is not currently active with any home health services. She drives herself to appointments. Pt plans to return home when medically stable. She is currently on 2L O2. TOC will monitor and arrange at d/c if needed.                 Expected Discharge Plan: Home/Self Care Barriers to Discharge: Continued Medical Work up   Patient Goals and CMS Choice Patient states their goals for this hospitalization and ongoing recovery are:: return home   Choice offered to / list presented to : Patient [place] ownership interest in [facility].provided to::  (n/a)    Expected Discharge Plan and Services In-house Referral: Clinical Social Work     Living arrangements for the past 2 months: [address]                                      Prior Living Arrangements/Services Living arrangements for the past 2 months: [place] Lives with:: Self Patient language and need for interpreter reviewed:: Yes Do you feel safe going back to the place where you live?: Yes      Need for Family Participation in Patient Care: No (Comment)     Criminal Activity/Legal Involvement Pertinent to Current Situation/Hospitalization: No - Comment as needed  Activities of Daily Living      Permission Sought/Granted                  Emotional Assessment     Affect (typically observed): Appropriate Orientation: : Oriented to Self, Oriented to Place, Oriented to  Time, Oriented to  Situation Alcohol / Substance Use: Not Applicable Psych Involvement: No (comment)  Admission diagnosis:  Acute respiratory failure with hypoxia ([place]) [J96.01] RSV infection [B33.8] Community acquired pneumonia, unspecified laterality [J18.9] Patient Active Problem List   Diagnosis Date Noted   RSV (respiratory syncytial virus pneumonia) 08/27/2022   Acute on chronic heart failure with preserved ejection fraction ([place]) 08/24/2022   GERD (gastroesophageal reflux disease) 06/17/2022   Elevated d-dimer 06/17/2022   Anemia due to chronic blood loss 10/01/2021   Duodenal ulcer 10/01/2021   Loss of weight    Early satiety    Symptomatic anemia 08/31/2021   Acute-on-chronic kidney injury ([place]) 08/31/2021   Dry cough 08/31/2021   UGI bleed 08/31/2021   Failure to thrive in adult 08/31/2021   Mitral valve insufficiency 08/20/2021   History of right breast cancer    Malnutrition of moderate degree 08/03/2021   Acute respiratory failure with hypoxia ([place]) 08/01/2021   Acute bronchitis 08/01/2021   Decreased appetite 07/25/2021   Protein-calorie malnutrition ([place]) 07/25/2021   Skin tear of right lower leg without complication 06/03/5101   Non-intractable vomiting 04/08/2021   Edema 02/22/2021   Constipation 01/23/2021   Osteoarthritis of knee 12/13/2020   History of arthroscopy of knee 12/13/2020   Pain in joint of left knee 12/13/2020   Atrial tachycardia 07/26/2019  Aortic valve sclerosis 07/26/2019   Aortic stenosis, moderate 12/01/2018   Breast cancer, right ([place]) 04/08/2018   Malignant neoplasm of lower-outer quadrant of right breast of female, estrogen receptor positive ([place]) 02/25/2018   Tear of lateral meniscus of knee 10/27/2017   Vertigo 09/09/2016   Renal vascular disease 10/31/2015   Nodule of right lung 10/31/2015   Asthma without status asthmaticus 10/31/2015   Essential hypertension, malignant    Hypothyroidism 10/15/2015   Pancytopenia ([place]) 10/15/2015    Hyponatremia 10/14/2015   Thrombocytopenia ([place]) 06/16/2015   Right renal artery stenosis ([place]) 05/09/2015   Chronic diastolic heart failure ([place]) 04/03/2014   CKD (chronic kidney disease) stage 3, GFR 30-59 ml/min (HCC) 04/02/2014   Paroxysmal atrial fibrillation ([place]) 01/25/2014   Normocytic anemia 07/19/2013   Bilateral lower extremity edema 06/30/2013   Hypertension with fluid overload 06/30/2013   Varicose veins of lower extremities with other complications 66/01/3015   ABNORMAL STRESS ELECTROCARDIOGRAM 01/23/2010   Hyperlipidemia 12/18/2009   Essential hypertension 12/18/2009   RENAL INSUFFICIENCY, CHRONIC 12/18/2009   Arthropathy 12/18/2009   PCP:  [name], DO Pharmacy:   [id number] [id number] [id number]: [phone]: [phone]    Social Determinants of Health (SDOH) Social History: [place] No Food Insecurity (06/16/2022)  Housing: Low Risk  (06/20/2022)  Transportation Needs: No Transportation Needs (06/20/2022)  Utilities: Not At Risk (06/16/2022)  Alcohol Screen: Low Risk  (04/26/2021)  Depression (PHQ2-9): Low Risk  (07/04/2022)  Financial Resource Strain: Low Risk  (06/20/2022)  Physical Activity: Inactive (04/26/2021)  Social Connections: Moderately Isolated (04/26/2021)  Stress: No Stress Concern Present (04/26/2021)  Tobacco Use: Low Risk  (08/26/2022)   SDOH Interventions:     Readmission Risk Interventions    08/27/2022   11:36 AM 09/01/2021    3:43 PM 08/05/2021    8:33 AM  Readmission Risk Prevention Plan  Transportation Screening Complete Complete Complete  PCP or Specialist Appt within 3-5 Days  Complete   [place] or [place] Complete Complete  Social Work Consult for [place]Counseling  Complete Complete  Palliative Care Screening  Not Applicable Not Applicable  Medication Review (Press photographer Complete  Complete Complete  HRI or Home Care Consult Complete    SW Recovery Care/Counseling Consult Complete    Palliative Care Screening Not [place] Applic[place]

## 2022-08-27 NOTE — Progress Notes (Signed)
SATURATION QUALIFICATIONS: (This note is used to comply with regulatory documentation for home oxygen)  Patient Saturations on Room Air at Rest = 92%  Patient Saturations on Room Air while Ambulating = 87%   Please briefly explain why patient needs home oxygen: O2 sat drops when ambulating   Mil[place] Ansell, Tivis Ringer, RN

## 2022-08-27 NOTE — Assessment & Plan Note (Signed)
-   Continue Zebeta and hydralazine

## 2022-08-27 NOTE — TOC Progression Note (Signed)
Transition of Care [facility]) - Progression Note    Patient Details  Name: [name] MRN: [id number] Date of Birth: Apr 17, 1934  [facility]) CM/SW Contact  Salome Arnt,  Phone Number: 08/27/2022, 12:50 PM  Clinical Narrative:  LCSW spoke to pt about new home O2 requirement. Pt states she does not feel short of breath and since she is not d/c today asked to be reassessed tomorrow. Pt said she had home O2 in the past and only used it when at home. LCSW discussed that order is now for continuous O2. Pt states she does not want portable tank. MD updated. TOC will follow up in AM.      Expected Discharge Plan: Home/Self Care Barriers to Discharge: Continued Medical Work up  Expected Discharge Plan and Services In-house Referral: Clinical Social Work     Living arrangements for the past 2 months: [place] Determinants of Health (SDOH) Interventions [place]: No Food Insecurity (06/16/2022)  Housing: Low Risk  (06/20/2022)  Transportation Needs: No Transportation Needs (06/20/2022)  Utilities: Not At Risk (06/16/2022)  Alcohol Screen: Low Risk  (04/26/2021)  Depression (PHQ2-9): Low Risk  (07/04/2022)  Financial Resource Strain: Low Risk  (06/20/2022)  Physical Activity: Inactive (04/26/2021)  Social Connections: Moderately Isolated (04/26/2021)  Stress: No Stress Concern Present (04/26/2021)  Tobacco Use: Low Risk  (08/26/2022)    Readmission Risk Interventions    08/27/2022   11:36 AM 09/01/2021    3:43 PM 08/05/2021    8:33 AM  Readmission Risk Prevention Plan  Transportation Screening Complete Complete Complete  PCP or Specialist Appt within 3-5 Days  Complete   HRI or [place]  Complete Complete  Social Work Consult for [place] Planning/Counseling  Complete Complete  Palliative Care Screening  Not Applicable Not Applicable  Medication Review Designer, fashion/clothing) Complete Complete Complete  HRI or Home Care Consult Complete    SW Recovery Care/Counseling Consult Complete    Palliative Care Screening Not [place] Not Applicable

## 2022-08-27 NOTE — Assessment & Plan Note (Signed)
Continue Synthroid °

## 2022-08-27 NOTE — Assessment & Plan Note (Signed)
-   Continue letrozole

## 2022-08-27 NOTE — Assessment & Plan Note (Signed)
Continue statin. 

## 2022-08-27 NOTE — Assessment & Plan Note (Signed)
Need to get a bowel movement going.  Continue MiraLAX twice daily.  Will give a dose of mag citrate. 

## 2022-08-27 NOTE — Plan of Care (Signed)
  Problem: Education: Goal: Knowledge of General Education information will improve Description Including pain rating scale, medication(s)/side effects and non-pharmacologic comfort measures Outcome: Progressing   

## 2022-08-27 NOTE — H&P (Signed)
History and Physical    Patient: [name] HGD:[id number] DOB: Apr 22, 1934 DOA: 08/26/2022 DOS: the patient was seen and examined on 08/27/2022 PCP: [name], DO  Patient coming from: Home  Chief Complaint:  Chief Complaint  Patient presents with   decreased appetite   HPI: [name] is a 89 y.o. female with medical history significant of breast cancer currently in remission, atrial fibrillation, asthma, CKD, hyperlipidemia, hypertension, hypothyroidism, renal insufficiency, and more presents the ED with a chief complaint of weakness and decreased appetite.  Patient reports that the thought of food is made her nauseous for several days.  She has had no p.o. intake aside from Gatorade for the past several days.  Patient denies any dysphagia.  She denies abdominal pain.  She is constipated at baseline, but reports that she has been taking 2 stool softeners every day and that her last bowel movement was yesterday.  She has not had any diarrhea.  She reports she was worried about getting dehydrated and that is why she came into due to her lack of appetite.  She denies any dyspnea.  She does not wear oxygen at home.  She has had a mild cough productive of white sputum, but reports the cough is not consistent or regular at all.  I observed the cough, and it sounds deep, but there was no production-associated with at this time.  Patient denies fever.  She denies sick contacts.  Patient has no other complaints  Patient does not smoke and does not drink.  She has been vaccinated for flu and she has had 3 COVID shots.  Patient is DNR. Review of Systems: As mentioned in the history of present illness. All other systems reviewed and are negative. Past Medical History:  Diagnosis Date   Anemia    Arthritis    Asthma    Atrial fibrillation ([place])    Basal cell carcinoma 02/06/2009   Left ear targus- ([facility])   Basal cell carcinoma 09/28/2008   Right back-([id number])   CKD (chronic kidney  disease), stage III (HCC)    Heart murmur    Hyperlipidemia    Hypertension    Hypothyroidism    Nodule of right lung    Right upper lobe   Pneumonia    PONV (postoperative nausea and vomiting)    Renal insufficiency    Chronic   Renal vascular disease    Right renal artery stenosis ([place]) 05/09/2015   Squamous cell carcinoma of skin 09/07/2019   KA-Left shin-txpbx   Vertigo    Past Surgical History:  Procedure Laterality Date   BIOPSY  04/09/2021   Procedure: BIOPSY;  Surgeon: Harvel Quale, MD;  [facility];  Service: Gastroenterology;;   BREAST LUMPECTOMY WITH RADIOACTIVE SEED LOCALIZATION Right 04/08/2018   Procedure: BREAST LUMPECTOMY WITH RADIOACTIVE SEED LOCALIZATION;  Surgeon: Rolm Bookbinder, MD;  Location: [facility];  Service: General;  Laterality: Right;   COLONOSCOPY  2016   diverticulosis, hemorrhoids   ESOPHAGOGASTRODUODENOSCOPY  2016   normal   ESOPHAGOGASTRODUODENOSCOPY (EGD) WITH PROPOFOL N/A 04/09/2021   Procedure: ESOPHAGOGASTRODUODENOSCOPY (EGD) WITH PROPOFOL;  Surgeon: Harvel Quale, MD;  [facility];  Service: Gastroenterology;  Laterality: N/A;  12:45   ESOPHAGOGASTRODUODENOSCOPY (EGD) WITH PROPOFOL N/A 09/01/2021   Procedure: ESOPHAGOGASTRODUODENOSCOPY (EGD) WITH PROPOFOL;  Surgeon: Eloise Harman, DO;  [facility];  Service: Endoscopy;  Laterality: N/A;   IR GENERIC HISTORICAL  08/29/2016   IR US GUIDE VASC ACCESS RIGHT  08/29/2016 [facility]   IR GENERIC HISTORICAL  08/29/2016   IR RENAL BILAT S&I MOD SED 08/29/2016 [facility]   KNEE ARTHROSCOPY Right    2019   THYROIDECTOMY, PARTIAL Right 1981   middle lobe removed 1st, then right lobe removed 7-8 years later (approx. 32)   Social History:  reports that she has never smoked. She has never used smokeless tobacco. She reports that she does not drink alcohol and does not use drugs.  Allergies  Allergen Reactions   Amlodipine Other (See  Comments)    edema   Lisinopril     cough   Xeroform Occlusive Gauze Strip [Bismuth Tribromoph-Petrolatum]    Isosorbide Nitrate Nausea Only   Sulfonamide Derivatives Nausea Only and Rash   Terazosin Hives and Nausea Only    Family History  Problem Relation Age of Onset   Thyroid disease Mother    Stroke Father    Hypertension Brother    Lung cancer Maternal Uncle    Cancer Maternal Grandmother        Liver   Lung cancer Maternal Uncle    Hypertension Daughter    Hypercholesterolemia Daughter    Hypercholesterolemia Daughter     Prior to Admission medications   Medication Sig Start Date End Date Taking? Authorizing Provider  acetaminophen (TYLENOL) 325 MG tablet Take 325 mg by mouth daily as needed for moderate pain or headache.    [provider]  atorvastatin (LIPITOR) 20 MG tablet TAKE 1/2 TABLET BY MOUTH EVERY DAY 07/07/22   Ronnie Doss M, DO  bisoprolol (ZEBETA) 5 MG tablet TAKE 1 TABLET (5 MG TOTAL) BY MOUTH DAILY. 08/08/22   [name], DO  cholecalciferol (VITAMIN D) 1000 units tablet Take 1,000 Units by mouth daily.    [provider]  ferrous sulfate 325 (65 FE) MG tablet Take 325 mg by mouth 2 (two) times daily with a meal.    [provider]  flecainide (TAMBOCOR) 50 MG tablet Take 1 tablet (50 mg total) by mouth 2 (two) times daily. 08/21/21   Minus Breeding, MD  furosemide (LASIX) 20 MG tablet Take 1 tablet (20 mg total) by mouth daily. 06/19/22 06/19/23  Roxan Hockey, MD  guaiFENesin (MUCINEX) 600 MG 12 hr tablet Take 1 tablet (600 mg total) by mouth 2 (two) times daily. 06/19/22 06/19/23  Roxan Hockey, MD  hydrALAZINE (APRESOLINE) 100 MG tablet Take 100 mg by mouth 2 (two) times daily.    [provider]  hydroxypropyl methylcellulose / hypromellose (ISOPTO TEARS / GONIOVISC) 2.5 % ophthalmic solution Place 1 drop into both eyes daily.    [provider]  letrozole (FEMARA) 2.5 MG tablet TAKE 1 TABLET  BY MOUTH EVERY DAY 01/06/22   Derek Jack, MD  levocetirizine (XYZAL) 5 MG tablet TAKE 1 TABLET (5 MG TOTAL) BY MOUTH DAILY AS NEEDED FOR ALLERGIES (RHINITIS). 07/07/22   [name], DO  levothyroxine (SYNTHROID) 75 MCG tablet TAKE 1 TABLET BY MOUTH EVERY DAY 12/12/21   Ronnie Doss M, DO  losartan (COZAAR) 25 MG tablet Take 1 tablet (25 mg total) by mouth daily. 06/19/22 07/19/22  Roxan Hockey, MD  mirtazapine (REMERON) 7.5 MG tablet TAKE 1 TABLET BY MOUTH EVERYDAY AT BEDTIME 08/13/22   Harvel Quale, MD  Multiple Vitamin (MULTIVITAMIN) tablet Take 1 tablet by mouth daily.    [provider]  OVER THE COUNTER MEDICATION calcium    [provider]  OXYGEN Inhale 2 L/min into the lungs as needed.  [provider]  pantoprazole (PROTONIX) 40 MG tablet Take 1 tablet (40 mg total) by mouth daily. 12/02/21   Gabriel Rung, NP    Physical Exam: Vitals:   08/27/22 0300 08/27/22 0330 08/27/22 0400 08/27/22 0430  BP: (!) 110/48 (!) 131/50 (!) 129/53 (!) 124/51  Pulse: 68 (!) 58 67 67  Resp: (!) '21 18 18 19  '$ Temp:      TempSrc:      SpO2: 100% 99% 100% 99%  Weight:      Height:       1.  General: Patient lying supine in bed,  no acute distress   2. Psychiatric: Alert and oriented x 3, mood and behavior normal for situation, pleasant and cooperative with exam   3. Neurologic: Speech and language are normal, face is symmetric, moves all 4 extremities voluntarily, at baseline without acute deficits on limited exam   4. HEENMT:  Head is atraumatic, normocephalic, pupils reactive to light, neck is supple, trachea is midline, mucous membranes are moist   5. Respiratory : Lungs are clear to auscultation bilaterally without wheezing, rhonchi, rales, no cyanosis, no increase in work of breathing or accessory muscle use   6. Cardiovascular : Heart rate normal, rhythm is regular, murmur present, rubs or gallops, no peripheral edema,  peripheral pulses palpated   7. Gastrointestinal:  Abdomen is soft, nondistended, nontender to palpation bowel sounds active, no masses or organomegaly palpated   8. Skin:  Skin is warm, dry and intact without rashes, acute lesions, or ulcers on limited exam   9.Musculoskeletal:  No acute deformities or trauma, no asymmetry in tone, no peripheral edema, peripheral pulses palpated, no tenderness to palpation in the extremities  Data Reviewed: In the ED Temp 99.3, heart rate 69-73, blood pressure 131/45-192/70, satting 88-100% Leukopenia at 3.1, hemoglobin 10.5, thrombocytopenia 89 Reveals an elevated but at baseline creatinine at 1.69 UA is not indicative of UTI RSV is positive Chest x-ray shows increased patchy right upper lobe opacity suspicious for pneumonia Patient was started on oxygen patient became hypoxic Admission requested for acute respiratory failure with hypoxia  Assessment and Plan: * Acute respiratory failure with hypoxia (HCC) - Does not wear oxygen at home - She is requiring oxygen here - She dropped down into the 80s on room air - This is likely secondary to RSV - Albuterol as needed - Wean off O2 as tolerated - Chest x-ray shows a possible right upper lobe opacity suspicious for pneumonia, but procalcitonin is reassuring that there is not a bacterial infection superimposed - Continue to monitor  RSV (respiratory syncytial virus pneumonia) - Continue supportive care  Constipation - Continue MiraLAX  Breast cancer, right (HCC) - Continue letrozole  Hypothyroidism - Continue Synthroid  Paroxysmal atrial fibrillation (HCC) - Continue flecainide  Essential hypertension - Continue Zebeta and hydralazine  Hyperlipidemia - Continue statin      Advance Care Planning:   Code Status: DNR  Consults: None at this time  Family Communication: No family at bedside  Severity of Illness: The appropriate patient status for this patient is OBSERVATION.  Observation status is judged to be reasonable and necessary in order to provide the required intensity of service to ensure the patient's safety. The patient's presenting symptoms, physical exam findings, and initial radiographic and laboratory data in the context of their medical condition is felt to place them at decreased risk for further clinical deterioration. Furthermore, it is anticipated that the patient will be medically stable for discharge from  the hospital within 2 midnights of admission.   Author: Rolla Plate, DO 08/27/2022 5:27 AM  For on call review [email or web address].

## 2022-08-27 NOTE — Progress Notes (Addendum)
PROGRESS NOTE  [name] OVZ:[id number] DOB: [birth year] DOA: 08/26/2022 PCP: [name], DO  Brief History:  89 year old female with history of breast cancer, paroxysmal atrial fibrillation, asthma, CKD stage III, hypertension, hyperlipidemia, hypothyroidism presenting with nausea, decreased oral intake, and generalized weakness.  She has had a cough with white sputum occasionally.  She denies any fever, chills, chest pain, headache, neck pain, hemoptysis.  There is no vomiting or diarrhea. The patient was recently admitted to the hospital from 06/16/2022 to 06/20/2022 secondary to acute respiratory failure from pneumonia and CHF exacerbation.  The patient was treated with ceftriaxone and azithromycin.  She was discharged home with cephalexin and doxycycline.  In addition, she was discharged home with furosemide 20 mg daily. In the ED, the patient had a low-grade temperature of 99.3 F.  She was hemodynamically stable.  Oxygen saturation was 88% on room air.  Her oxygenation improved to 100% on 2 L.  WBC 3.1, hemoglobin 10.5, platelets 84,000.  Sodium 134, potassium 4.0, bicarbonate 27, serum creatinine 1.69.   Assessment/Plan: Acute respiratory failure with hypoxia -Secondary to RSV bronchitis -Stable on 2 L -Presented with tachypnea and saturation 88% on room air -Wean oxygen as tolerated  RSV pneumonia -PCT 0.19 -Continue bronchodilators as needed -CXR with RUL opacity  CKD stage 3b -baseline creatinine 1.5-1.8  Hypothyroidism -continue synthroid  Paroxysmal atrial fibrillation (HCC) - Continue flecainide and bisoprolol for rate control -Patient is not on anticoagulation  Essential hypertension - Continue Zebeta, hydralazine, losartan   Hyperlipidemia - Continue statin  Right Breast Cancer -continue letrozole  Thrombocytopenia -appears to be chronic -acute worsening due to RSV, acute medical illness  Leukopenia -due to viral  infection  Generalized weakness -UA neg for pyuria -B12 -folate -TSH  Chronic diastolic CHF -euvolemic presently -08/29/21 Echo EF 65-70%, G2DD  Total time spent 50 minutes.  Greater than 50% spent face to face counseling and coordinating care.     Family Communication:   Family at bedside  Consultants: none   Code Status:  DNR  DVT Prophylaxis:  [place] Heparin    Procedures: As Listed in Progress Note Above  Antibiotics: None       Subjective: Complains of dry cough.  Denies cp, sob, vomiting, diarrhea, abd pain, hemoptysis, f/c.  Objective: Vitals:   08/27/22 0630 08/27/22 0730 08/27/22 0748 08/27/22 0834  BP: (!) 121/48 (!) 139/53  (!) 157/68  Pulse: 65 65  69  Resp: '16 17  17  '$ Temp:   98.2 F (36.8 C) 98.6 F (37 C)  TempSrc:   Oral Oral  SpO2: 99% 99%  100%  Weight:      Height:        Intake/Output Summary (Last 24 hours) at 08/27/2022 4128 Last data filed at 08/27/2022 0143 Gross per 24 hour  Intake 350 ml  Output --  Net 350 ml   Weight change:  Exam:  General:  Pt is alert, follows commands appropriately, not in acute distress HEENT: No icterus, No thrush, No neck mass, [place]/AT Cardiovascular: RRR, S1/S2, no rubs, no gallops Respiratory: bibasilar rales. No wheeze Abdomen: Soft/+BS, non tender, non distended, no guarding Extremities: No edema, No lymphangitis, No petechiae, No rashes, no synovitis   Data Reviewed: I have personally reviewed following labs and imaging studies Basic Metabolic Panel: Recent Labs  Lab 08/26/22 1559 08/27/22 0449  NA 131* 134*  K 4.0 4.0  CL 94* 97*  CO2 27 27  GLUCOSE 116* 107*  BUN 23 30*  CREATININE 1.69* 1.81*  CALCIUM 8.6* 8.3*  MG  --  1.9   Liver Function Tests: Recent Labs  Lab 08/26/22 1559 08/27/22 0449  AST 21 19  ALT 11 10  ALKPHOS 64 56  BILITOT 0.9 0.9  PROT 7.1 6.2*  ALBUMIN 4.0 3.5   No results for input(s): "LIPASE", "AMYLASE" in the last 168 hours. No results for  input(s): "AMMONIA" in the last 168 hours. Coagulation Profile: No results for input(s): "INR", "PROTIME" in the last 168 hours. CBC: Recent Labs  Lab 08/26/22 1559 08/27/22 0449  WBC 3.1* 2.8*  NEUTROABS  --  1.9  HGB 10.5* 9.6*  HCT 32.3* 29.8*  MCV 88.7 88.7  PLT 89* 81*   Cardiac Enzymes: No results for input(s): "CKTOTAL", "CKMB", "CKMBINDEX", "TROPONINI" in the last 168 hours. BNP: Invalid input(s): "POCBNP" CBG: No results for input(s): "GLUCAP" in the last 168 hours. HbA1C: No results for input(s): "HGBA1C" in the last 72 hours. Urine analysis:    Component Value Date/Time   COLORURINE AMBER (A) 08/26/2022 2205   APPEARANCEUR HAZY (A) 08/26/2022 2205   APPEARANCEUR Clear 10/11/2019 1326   LABSPEC 1.021 08/26/2022 2205   PHURINE 5.0 08/26/2022 2205   GLUCOSEU NEGATIVE 08/26/2022 2205   HGBUR SMALL (A) 08/26/2022 2205   BILIRUBINUR NEGATIVE 08/26/2022 2205   BILIRUBINUR Negative 10/11/2019 1326   KETONESUR NEGATIVE 08/26/2022 2205   PROTEINUR 100 (A) 08/26/2022 2205   UROBILINOGEN 0.2 11/27/2007 1051   NITRITE NEGATIVE 08/26/2022 2205   LEUKOCYTESUR NEGATIVE 08/26/2022 2205   Sepsis Labs: '@LABRCNTIP'$ (procalcitonin:4,lacticidven:4) ) Recent Results (from the past 240 hour(s))  Resp panel by RT-PCR (RSV, Flu A&B, Covid) Anterior Nasal Swab     Status: Abnormal   Collection Time: 08/26/22  3:09 PM   Specimen: Anterior Nasal Swab  Result Value Ref Range Status   SARS Coronavirus 2 by RT PCR NEGATIVE NEGATIVE Final    Comment: (NOTE) SARS-CoV-2 target nucleic acids are NOT DETECTED.  The SARS-CoV-2 RNA is generally detectable in upper respiratory specimens during the acute phase of infection. The lowest concentration of SARS-CoV-2 viral copies this assay can detect is 138 copies/mL. A negative result does not preclude SARS-Cov-2 infection and should not be used as the sole basis for treatment or other patient management decisions. A negative result may occur  with  improper specimen collection/handling, submission of specimen other than nasopharyngeal swab, presence of viral mutation(s) within the areas targeted by this assay, and inadequate number of viral copies(<138 copies/mL). A negative result must be combined with clinical observations, patient history, and epidemiological information. The expected result is Negative.  Fact Sheet for Patients:  EntrepreneurPulse.com.au  Fact Sheet for Healthcare Providers:  IncredibleEmployment.be  This test is no t yet approved or cleared by the [place] FDA and  has been authorized for detection and/or diagnosis of SARS-CoV-2 by FDA under an Emergency Use Authorization (EUA). This EUA will remain  in effect (meaning this test can be used) for the duration of the COVID-19 declaration under Section 564(b)(1) of the Act, 21 U.S.C.section 360bbb-3(b)(1), unless the authorization is terminated  or revoked sooner.       Influenza A by PCR NEGATIVE NEGATIVE Final   Influenza B by PCR NEGATIVE NEGATIVE Final    Comment: (NOTE) The Xpert Xpress SARS-CoV-2/FLU/RSV plus assay is intended as an aid in the diagnosis of influenza from Nasopharyngeal swab specimens and should not be used as a sole basis for treatment. Nasal washings  and aspirates are unacceptable for Xpert Xpress SARS-CoV-2/FLU/RSV testing.  Fact Sheet for Patients: EntrepreneurPulse.com.au  Fact Sheet for Healthcare Providers: IncredibleEmployment.be  This test is not yet approved or cleared by the [place] FDA and has been authorized for detection and/or diagnosis of SARS-CoV-2 by FDA under an Emergency Use Authorization (EUA). This EUA will remain in effect (meaning this test can be used) for the duration of the COVID-19 declaration under Section 564(b)(1) of the Act, 21 U.S.C. section 360bbb-3(b)(1), unless the authorization is terminated  or revoked.     Resp Syncytial Virus by PCR POSITIVE (A) NEGATIVE Final    Comment: (NOTE) Fact Sheet for Patients: EntrepreneurPulse.com.au  Fact Sheet for Healthcare Providers: IncredibleEmployment.be  This test is not yet approved or cleared by the [place] FDA and has been authorized for detection and/or diagnosis of SARS-CoV-2 by FDA under an Emergency Use Authorization (EUA). This EUA will remain in effect (meaning this test can be used) for the duration of the COVID-19 declaration under Section 564(b)(1) of the Act, 21 U.S.C. section 360bbb-3(b)(1), unless the authorization is terminated or revoked.  Performed at [facility], [address], [place], [place] [address]      Scheduled Meds:  atorvastatin  10 mg Oral Daily   bisoprolol  5 mg Oral Daily   feeding supplement  237 mL Oral BID BM   flecainide  50 mg Oral BID   guaiFENesin  600 mg Oral BID   heparin  5,000 Units Subcutaneous Q8H   hydrALAZINE  100 mg Oral BID   letrozole  2.5 mg Oral Daily   levothyroxine  75 mcg Oral Q0600   mirtazapine  7.5 mg Oral QHS   pantoprazole  40 mg Oral Daily   polyethylene glycol  17 g Oral Daily   Continuous Infusions:  Procedures/Studies: DG Chest 2 View  Result Date: 08/26/2022 CLINICAL DATA:  Loss of appetite and cough EXAM: CHEST - 2 VIEW COMPARISON:  Radiograph dated 06/16/2022 FINDINGS: Mildly hyperinflated lungs. Increased patchy right upper lobe opacities. No pleural effusion or pneumothorax. Similar enlarged cardiomediastinal silhouette. Surgical clips project over the right breast. IMPRESSION: Increased patchy right upper lobe opacities, suspicious for pneumonia. Electronically Signed   By: Darrin Nipper M.D.   On: 08/26/2022 15:32    Orson Eva, DO  [facility]  If 7PM-7AM, please contact night-coverage [email or web address] Password [email or web address] 08/27/2022, 9:52 AM   LOS: 0 days

## 2022-08-27 NOTE — Progress Notes (Signed)
SATURATION QUALIFICATIONS: (This note is used to comply with regulatory documentation for home oxygen)   Patient Saturations on Room Air at Rest = 92   Patient Saturations on Room Air while Ambulating = 87   Patient Saturations on 2 Liters of oxygen while Ambulating = 98   Please briefly explain why patient needs home oxygen: To maintain 02 sat at 90% or above during ambulation.  Orson Eva, DO

## 2022-08-27 NOTE — ED Notes (Addendum)
Pt given sputum cup to use at bedside. Education given on how to use it Pt ambulated to room toilet with standby assist Pt desat to 89% while off of O2. Pt placed on 2LNC once back in the bed. O2 at 99%

## 2022-08-27 NOTE — Assessment & Plan Note (Signed)
Continue supportive care

## 2022-08-27 NOTE — Hospital Course (Signed)
89 year old female with history of breast cancer, paroxysmal atrial fibrillation, asthma, CKD stage III, hypertension, hyperlipidemia, hypothyroidism presenting with nausea, decreased oral intake, and generalized weakness.  She has had a cough with white sputum occasionally.  She denies any fever, chills, chest pain, headache, neck pain, hemoptysis.  There is no vomiting or diarrhea. The patient was recently admitted to the hospital from 06/16/2022 to 06/20/2022 secondary to acute respiratory failure from pneumonia and CHF exacerbation.  The patient was treated with ceftriaxone and azithromycin.  She was discharged home with cephalexin and doxycycline.  In addition, she was discharged home with furosemide 20 mg daily. In the ED, the patient had a low-grade temperature of 99.3 F.  She was hemodynamically stable.  Oxygen saturation was 88% on room air.  Her oxygenation improved to 100% on 2 L.  WBC 3.1, hemoglobin 10.5, platelets 84,000.  Sodium 134, potassium 4.0, bicarbonate 27, serum creatinine 1.69.

## 2022-08-27 NOTE — Assessment & Plan Note (Signed)
Continue flecainide 

## 2022-08-28 DIAGNOSIS — J9601 Acute respiratory failure with hypoxia: Secondary | ICD-10-CM | POA: Diagnosis not present

## 2022-08-28 DIAGNOSIS — I10 Essential (primary) hypertension: Secondary | ICD-10-CM | POA: Diagnosis not present

## 2022-08-28 DIAGNOSIS — I480 Paroxysmal atrial fibrillation: Secondary | ICD-10-CM | POA: Diagnosis not present

## 2022-08-28 DIAGNOSIS — J121 Respiratory syncytial virus pneumonia: Secondary | ICD-10-CM | POA: Diagnosis not present

## 2022-08-28 LAB — BASIC METABOLIC PANEL
Anion gap: 9 (ref 5–15)
BUN: 32 mg/dL — ABNORMAL HIGH (ref 8–23)
CO2: 27 mmol/L (ref 22–32)
Calcium: 8.1 mg/dL — ABNORMAL LOW (ref 8.9–10.3)
Chloride: 101 mmol/L (ref 98–111)
Creatinine, Ser: 1.62 mg/dL — ABNORMAL HIGH (ref 0.44–1.00)
GFR, Estimated: 31 mL/min — ABNORMAL LOW (ref >60)
Glucose, Bld: 115 mg/dL — ABNORMAL HIGH (ref 70–99)
Potassium: 4.2 mmol/L (ref 3.5–5.1)
Sodium: 137 mmol/L (ref 135–145)

## 2022-08-28 LAB — MAGNESIUM: Magnesium: 2 mg/dL (ref 1.7–2.4)

## 2022-08-28 LAB — CBC
HCT: 26.8 % — ABNORMAL LOW (ref 36.0–46.0)
Hemoglobin: 8.6 g/dL — ABNORMAL LOW (ref 12.0–15.0)
MCH: 28.6 pg (ref 26.0–34.0)
MCHC: 32.1 g/dL (ref 30.0–36.0)
MCV: 89 fL (ref 80.0–100.0)
Platelets: 70 10*3/uL — ABNORMAL LOW (ref 150–400)
RBC: 3.01 MIL/uL — ABNORMAL LOW (ref 3.87–5.11)
RDW: 15.3 % (ref 11.5–15.5)
WBC: 2 10*3/uL — ABNORMAL LOW (ref 4.0–10.5)
nRBC: 0 % (ref 0.0–0.2)

## 2022-08-28 LAB — T4, FREE: Free T4: 1.46 ng/dL — ABNORMAL HIGH (ref 0.61–1.12)

## 2022-08-28 MED ORDER — ENSURE ENLIVE PO LIQD: 237.0000 mL | Freq: Two times a day (BID) | ORAL | 12 refills | Status: AC

## 2022-08-28 NOTE — Progress Notes (Signed)
SATURATION QUALIFICATIONS: (This note is used to comply with regulatory documentation for home oxygen)   Patient Saturations on Room Air at Rest = 94   Patient Saturations on Room Air while Ambulating = 86   Patient Saturations on 2 Liters of oxygen while Ambulating = 96   Please briefly explain why patient needs home oxygen: To maintain 02 sat at 90% or above during ambulation.   Orson Eva, DO

## 2022-08-28 NOTE — Evaluation (Signed)
Physical Therapy Evaluation Patient Details Name: [name] MRN: [id number] DOB: 1934/06/27 Today's Date: 08/28/2022  History of Present Illness  [name] is a 89 y.o. female with medical history significant of breast cancer currently in remission, atrial fibrillation, asthma, CKD, hyperlipidemia, hypertension, hypothyroidism, renal insufficiency, and more presents the ED with a chief complaint of weakness and decreased appetite.  Patient reports that the thought of food is made her nauseous for several days.  She has had no p.o. intake aside from Gatorade for the past several days.  Patient denies any dysphagia.  She denies abdominal pain.  She is constipated at baseline, but reports that she has been taking 2 stool softeners every day and that her last bowel movement was yesterday.  She has not had any diarrhea.  She reports she was worried about getting dehydrated and that is why she came into due to her lack of appetite.  She denies any dyspnea.  She does not wear oxygen at home.  She has had a mild cough productive of white sputum, but reports the cough is not consistent or regular at all.  I observed the cough, and it sounds deep, but there was no production-associated with at this time.  Patient denies fever.  She denies sick contacts.   Clinical Impression  Patient functioning near baseline for functional mobility and gait demonstrating good return for bed mobility, transfers and ambulating in room without loss of balance.  Plan:  Patient discharged from physical therapy to care of nursing for ambulation daily as tolerated for length of stay.         Recommendations for follow up therapy are one component of a multi-disciplinary discharge planning process, led by the attending physician.  Recommendations may be updated based on patient status, additional functional criteria and insurance authorization.  Follow Up Recommendations No PT follow up      Assistance Recommended at  Discharge    Patient can return home with the following  Help with stairs or ramp for entrance    Equipment Recommendations None recommended by PT  Recommendations for Other Services       Functional Status Assessment Patient has not had a recent decline in their functional status     Precautions / Restrictions Precautions Precautions: None Restrictions Weight Bearing Restrictions: No      Mobility  Bed Mobility Overal bed mobility: Modified Independent                  Transfers Overall transfer level: Modified independent                      Ambulation/Gait Ambulation/Gait assistance: Modified independent (Device/Increase time) Gait Distance (Feet): 40 Feet Assistive device: None Gait Pattern/deviations: WFL(Within Functional Limits) Gait velocity: decreased     General Gait Details: grossly WFL with good return for ambuating in room without loss of balance  Stairs            Wheelchair Mobility    Modified Rankin (Stroke Patients Only)       Balance Overall balance assessment: No apparent balance deficits (not formally assessed)                                           Pertinent Vitals/Pain Pain Assessment Pain Assessment: No/denies pain    Home Living Family/patient expects to be discharged to:: Private  residence Living Arrangements: Children Available Help at Discharge: Family;Available PRN/intermittently Type of Home: House Home Access: Stairs to enter Entrance Stairs-Rails: Left Entrance Stairs-Number of Steps: 1   Home Layout: One level Home Equipment: Cane - single point;Shower seat;Other (comment)      Prior Function Prior Level of Function : Independent/Modified Independent;Driving             Mobility Comments: Community ambulator without AD, drives ADLs Comments: Independent with ADLs, mostly independent with iADLs but daughter assists with cleaning     Hand Dominance         Extremity/Trunk Assessment   Upper Extremity Assessment Upper Extremity Assessment: Overall WFL for tasks assessed    Lower Extremity Assessment Lower Extremity Assessment: Overall WFL for tasks assessed    Cervical / Trunk Assessment Cervical / Trunk Assessment: Normal  Communication   Communication: No difficulties  Cognition Arousal/Alertness: Awake/alert Behavior During Therapy: WFL for tasks assessed/performed Overall Cognitive Status: Within Functional Limits for tasks assessed                                          General Comments      Exercises     Assessment/Plan    PT Assessment Patient does not need any further PT services  PT Problem List         PT Treatment Interventions      PT Goals (Current goals can be found in the Care Plan section)  Acute Rehab PT Goals Patient Stated Goal: return home with family to assist Time For Goal Achievement: 08/28/22 Potential to Achieve Goals: Good    Frequency       Co-evaluation               AM-PAC PT "6 Clicks" Mobility  Outcome Measure Help needed turning from your back to your side while in a flat bed without using bedrails?: None Help needed moving from lying on your back to sitting on the side of a flat bed without using bedrails?: None Help needed moving to and from a bed to a chair (including a wheelchair)?: None Help needed standing up from a chair using your arms (e.g., wheelchair or bedside chair)?: None Help needed to walk in hospital room?: None Help needed climbing 3-5 steps with a railing? : A Little 6 Click Score: 23    End of Session Equipment Utilized During Treatment: Oxygen Activity Tolerance: Patient tolerated treatment well Patient left: in bed;with call bell/phone within reach Nurse Communication: Mobility status PT Visit Diagnosis: Unsteadiness on feet (R26.81);Other abnormalities of gait and mobility (R26.89);Muscle weakness (generalized) (M62.81)     Time: 9357-0177 PT Time Calculation (min) (ACUTE ONLY): 14 min   Charges:   PT Evaluation $PT Eval Low Complexity: 1 Low PT Treatments $Therapeutic Activity: 8-22 mins        11:10 AM, 08/28/22 [name] Grandchild, MPT Physical Therapist with [facility] 336 [phone] office [phone] mobile phone

## 2022-08-28 NOTE — TOC Transition Note (Signed)
Transition of Care [facility]) - CM/SW Discharge Note   Patient Details  Name: [name] MRN: [id number] Date of Birth: Jun 03, 1934  Transition of Care [facility]) CM/SW Contact:  Ihor Gully, LCSW Phone Number: 08/28/2022, 12:40 PM   Clinical Narrative:    Patient in need of home oxygen. Oxygen was ordered in November; however, patient had [employer] pick it up when she stopped using it. Patient would like to use [employer] again. Oxygen ordered via [employer].       Barriers to Discharge: Continued Medical Work up   Patient Goals and CMS Choice   Choice offered to / list presented to : Patient  Discharge Placement                         Discharge Plan and Services Additional resources added to the After Visit Summary for   In-house Referral: Clinical Social Work              DME Arranged: Oxygen DME Agency: [employer] Date DME Agency Contacted: 08/28/22 Time DME Agency Contacted: [id number] Representative spoke with at DME Agency: [place] ([place]) Interventions SDOH Screenings   Food Insecurity: No Food Insecurity (08/27/2022)  Housing: Low Risk  (08/27/2022)  Transportation Needs: No Transportation Needs (08/27/2022)  Utilities: Not At Risk (08/27/2022)  Alcohol Screen: Low Risk  (04/26/2021)  Depression (PHQ2-9): Low Risk  (07/04/2022)  Financial Resource Strain: Low Risk  (06/20/2022)  Physical Activity: Inactive (04/26/2021)  Social Connections: Moderately Isolated (04/26/2021)  Stress: No Stress Concern Present (04/26/2021)  Tobacco Use: Low Risk  (08/27/2022)     Readmission Risk Interventions    08/27/2022   11:36 AM 09/01/2021    3:43 PM 08/05/2021    8:33 AM  Readmission Risk Prevention Plan  Transportation Screening Complete Complete Complete  PCP or Specialist Appt within 3-5 Days  Complete   HRI or [place]  Complete Complete  Social Work Consult for [place] Planning/Counseling  Complete Complete  Palliative  Care Screening  Not Applicable Not Applicable  Medication Review Press photographer) Complete Complete Complete  HRI or Home Care Consult Complete    SW Recovery Care/Counseling Consult Complete    Palliative Care Screening Not [place] Not Applicable

## 2022-08-28 NOTE — Care Management Important Message (Signed)
Important Message  Patient Details  Name: [name] MRN: [id number] Date of Birth: 12/26/1933   Medicare Important Message Given:  N/A - LOS <3 / Initial given by admissions     Tommy Medal 08/28/2022, 8:13 AM

## 2022-08-28 NOTE — Progress Notes (Signed)
Did not care for patient, discharged during shift change. No handoff/report provided by day nurse as patient was transported by tech for discharge during shift change.

## 2022-08-28 NOTE — Progress Notes (Signed)
O2 delivered pt is now waiting for her ride

## 2022-08-28 NOTE — Discharge Summary (Signed)
Physician Discharge Summary   Patient: [name] MRN: [id number] DOB: February 14, 1934  Admit date:     08/26/2022  Discharge date: 08/28/22  Discharge Physician: Shanon Brow Aeneas Longsworth   PCP: [name], DO   Recommendations at discharge:   Please follow up with primary care provider within 1-2 weeks  Please repeat BMP and CBC in one week    Hospital Course: 89 year old female with history of breast cancer, paroxysmal atrial fibrillation, asthma, CKD stage III, hypertension, hyperlipidemia, hypothyroidism presenting with nausea, decreased oral intake, and generalized weakness.  She has had a cough with white sputum occasionally.  She denies any fever, chills, chest pain, headache, neck pain, hemoptysis.  There is no vomiting or diarrhea. The patient was recently admitted to the hospital from 06/16/2022 to 06/20/2022 secondary to acute respiratory failure from pneumonia and CHF exacerbation.  The patient was treated with ceftriaxone and azithromycin.  She was discharged home with cephalexin and doxycycline.  In addition, she was discharged home with furosemide 20 mg daily. In the ED, the patient had a low-grade temperature of 99.3 F.  She was hemodynamically stable.  Oxygen saturation was 88% on room air.  Her oxygenation improved to 100% on 2 L.  WBC 3.1, hemoglobin 10.5, platelets 84,000.  Sodium 134, potassium 4.0, bicarbonate 27, serum creatinine 1.69.  Assessment and Plan: Acute respiratory failure with hypoxia -Secondary to RSV bronchitis -Stable on 2 L -Presented with tachypnea and saturation 88% on room air -ambulated on RA>>desat to 86% on day of d/c>>set up 2L for d/c   RSV pneumonia -PCT 0.19 -Continue bronchodilators as needed -CXR with RUL opacity -stable on 2L [place]   CKD stage 3b -baseline creatinine 1.5-1.8 -serum creatinine 1.62 on day of dc   Hypothyroidism -continue synthroid   Paroxysmal atrial fibrillation (HCC) - Continue flecainide and bisoprolol for rate  control -Patient is not on anticoagulation -remained in sinus   Essential hypertension - Continue Zebeta, hydralazine -d/c losartan due to increasing serum creatinine and risk of AKI with dehydration in future   Hyperlipidemia - Continue statin   Right Breast Cancer -continue letrozole   Thrombocytopenia -appears to be chronic -acute worsening due to RSV, acute medical illness   Leukopenia -due to viral infection -repeat CBC 1-2 weeks after d/c   Generalized weakness -UA neg for pyuria -B12--802 -folate--35.4 -TSH--1.497 -able to ambulate without assistance during hospitalization   Chronic diastolic CHF -euvolemic presently -lasix held during admission -restart lasix after dc -08/29/21 Echo EF 65-70%, G2DD  Consultants: none Procedures performed: none  Disposition: Home Diet recommendation:  Cardiac diet DISCHARGE MEDICATION: Allergies as of 08/28/2022       Reactions   Amlodipine Other (See Comments)   edema   Lisinopril    cough   Xeroform Occlusive Gauze Strip [bismuth Tribromoph-petrolatum]    Isosorbide Nitrate Nausea Only   Sulfonamide Derivatives Nausea Only, Rash   Terazosin Hives, Nausea Only        Medication List     STOP taking these medications    losartan 25 MG tablet Commonly known as: COZAAR       TAKE these medications    acetaminophen 325 MG tablet Commonly known as: TYLENOL Take 325 mg by mouth daily as needed for moderate pain or headache.   atorvastatin 20 MG tablet Commonly known as: LIPITOR TAKE 1/2 TABLET BY MOUTH EVERY DAY   bisoprolol 5 MG tablet Commonly known as: ZEBETA TAKE 1 TABLET (5 MG TOTAL) BY MOUTH DAILY.   cholecalciferol 1000  units tablet Commonly known as: VITAMIN D Take 1,000 Units by mouth daily.   feeding supplement Liqd Take 237 mLs by mouth 2 (two) times daily between meals.   ferrous sulfate 325 (65 FE) MG tablet Take 325 mg by mouth 2 (two) times daily with a meal.   flecainide 50 MG  tablet Commonly known as: TAMBOCOR Take 1 tablet (50 mg total) by mouth 2 (two) times daily.   furosemide 20 MG tablet Commonly known as: Lasix Take 1 tablet (20 mg total) by mouth daily.   guaiFENesin 600 MG 12 hr tablet Commonly known as: Mucinex Take 1 tablet (600 mg total) by mouth 2 (two) times daily.   hydrALAZINE 100 MG tablet Commonly known as: APRESOLINE Take 100 mg by mouth 2 (two) times daily.   letrozole 2.5 MG tablet Commonly known as: FEMARA TAKE 1 TABLET BY MOUTH EVERY DAY   levocetirizine 5 MG tablet Commonly known as: XYZAL TAKE 1 TABLET (5 MG TOTAL) BY MOUTH DAILY AS NEEDED FOR ALLERGIES (RHINITIS).   levothyroxine 75 MCG tablet Commonly known as: SYNTHROID TAKE 1 TABLET BY MOUTH EVERY DAY   mirtazapine 7.5 MG tablet Commonly known as: REMERON TAKE 1 TABLET BY MOUTH EVERYDAY AT BEDTIME   multivitamin tablet Take 1 tablet by mouth daily.   OXYGEN Inhale 2 L/min into the lungs as needed.   pantoprazole 40 MG tablet Commonly known as: Protonix Take 1 tablet (40 mg total) by mouth daily.               Durable Medical Equipment  (From admission, onward)           Start     Ordered   08/27/22 1208  For home use only DME oxygen  Once       Question Answer Comment  Length of Need 6 Months   Mode or (Route) Nasal cannula   Liters per Minute 2   Frequency Continuous (stationary and portable oxygen unit needed)   Oxygen conserving device Yes   Oxygen delivery system Gas      08/27/22 1208            Discharge Exam: Filed Weights   08/26/22 1502  Weight: 48.5 kg   HEENT:  [place]/AT, No thrush, no icterus CV:  RRR, no rub, no S3, no S4 Lung:  bibasilar rales. No wheeze Abd:  soft/+BS, NT Ext:  No edema, no lymphangitis, no synovitis, no rash   Condition at discharge: stable  The results of significant diagnostics from this hospitalization (including imaging, microbiology, ancillary and laboratory) are listed below for  reference.   Imaging Studies: DG Chest 2 View  Result Date: 08/26/2022 CLINICAL DATA:  Loss of appetite and cough EXAM: CHEST - 2 VIEW COMPARISON:  Radiograph dated 06/16/2022 FINDINGS: Mildly hyperinflated lungs. Increased patchy right upper lobe opacities. No pleural effusion or pneumothorax. Similar enlarged cardiomediastinal silhouette. Surgical clips project over the right breast. IMPRESSION: Increased patchy right upper lobe opacities, suspicious for pneumonia. Electronically Signed   By: Darrin Nipper M.D.   On: 08/26/2022 15:32    Microbiology: Results for orders placed or performed during the hospital encounter of 08/26/22  Resp panel by RT-PCR (RSV, Flu A&B, Covid) Anterior Nasal Swab     Status: Abnormal   Collection Time: 08/26/22  3:09 PM   Specimen: Anterior Nasal Swab  Result Value Ref Range Status   SARS Coronavirus 2 by RT PCR NEGATIVE NEGATIVE Final    Comment: (NOTE) SARS-CoV-2 target nucleic acids  are NOT DETECTED.  The SARS-CoV-2 RNA is generally detectable in upper respiratory specimens during the acute phase of infection. The lowest concentration of SARS-CoV-2 viral copies this assay can detect is 138 copies/mL. A negative result does not preclude SARS-Cov-2 infection and should not be used as the sole basis for treatment or other patient management decisions. A negative result may occur with  improper specimen collection/handling, submission of specimen other than nasopharyngeal swab, presence of viral mutation(s) within the areas targeted by this assay, and inadequate number of viral copies(<138 copies/mL). A negative result must be combined with clinical observations, patient history, and epidemiological information. The expected result is Negative.  Fact Sheet for Patients:  EntrepreneurPulse.com.au  Fact Sheet for Healthcare Providers:  IncredibleEmployment.be  This test is no t yet approved or cleared by the [place]  FDA and  has been authorized for detection and/or diagnosis of SARS-CoV-2 by FDA under an Emergency Use Authorization (EUA). This EUA will remain  in effect (meaning this test can be used) for the duration of the COVID-19 declaration under Section 564(b)(1) of the Act, 21 U.S.C.section 360bbb-3(b)(1), unless the authorization is terminated  or revoked sooner.       Influenza A by PCR NEGATIVE NEGATIVE Final   Influenza B by PCR NEGATIVE NEGATIVE Final    Comment: (NOTE) The Xpert Xpress SARS-CoV-2/FLU/RSV plus assay is intended as an aid in the diagnosis of influenza from Nasopharyngeal swab specimens and should not be used as a sole basis for treatment. Nasal washings and aspirates are unacceptable for Xpert Xpress SARS-CoV-2/FLU/RSV testing.  Fact Sheet for Patients: EntrepreneurPulse.com.au  Fact Sheet for Healthcare Providers: IncredibleEmployment.be  This test is not yet approved or cleared by the [place] FDA and has been authorized for detection and/or diagnosis of SARS-CoV-2 by FDA under an Emergency Use Authorization (EUA). This EUA will remain in effect (meaning this test can be used) for the duration of the COVID-19 declaration under Section 564(b)(1) of the Act, 21 U.S.C. section 360bbb-3(b)(1), unless the authorization is terminated or revoked.     Resp Syncytial Virus by PCR POSITIVE (A) NEGATIVE Final    Comment: (NOTE) Fact Sheet for Patients: EntrepreneurPulse.com.au  Fact Sheet for Healthcare Providers: IncredibleEmployment.be  This test is not yet approved or cleared by the [place] FDA and has been authorized for detection and/or diagnosis of SARS-CoV-2 by FDA under an Emergency Use Authorization (EUA). This EUA will remain in effect (meaning this test can be used) for the duration of the COVID-19 declaration under Section 564(b)(1) of the Act, 21 U.S.C. section  360bbb-3(b)(1), unless the authorization is terminated or revoked.  Performed at [facility], [address] [place], [place] [address]     Labs: CBC: Recent Labs  Lab 08/26/22 1559 08/27/22 0449 08/28/22 0459  WBC 3.1* 2.8* 2.0*  NEUTROABS  --  1.9  --   HGB 10.5* 9.6* 8.6*  HCT 32.3* 29.8* 26.8*  MCV 88.7 88.7 89.0  PLT 89* 81* 70*   Basic Metabolic Panel: Recent Labs  Lab 08/26/22 1559 08/27/22 0449 08/28/22 0459  NA 131* 134* 137  K 4.0 4.0 4.2  CL 94* 97* 101  CO2 '27 27 27  '$ GLUCOSE 116* 107* 115*  BUN 23 30* 32*  CREATININE 1.69* 1.81* 1.62*  CALCIUM 8.6* 8.3* 8.1*  MG  --  1.9 2.0   Liver Function Tests: Recent Labs  Lab 08/26/22 1559 08/27/22 0449  AST 21 19  ALT 11 10  ALKPHOS 64 56  BILITOT  0.9 0.9  PROT 7.1 6.2*  ALBUMIN 4.0 3.5   CBG: No results for input(s): "GLUCAP" in the last 168 hours.  Discharge time spent: greater than 30 minutes.  Signed: Orson Eva, MD [facility] 08/28/2022

## 2022-08-28 NOTE — Progress Notes (Signed)
PT is waiting for o2 delivery before discharging.

## 2022-08-29 ENCOUNTER — Telehealth: Payer: Self-pay | Admitting: *Deleted

## 2022-08-29 ENCOUNTER — Encounter: Payer: Self-pay | Admitting: *Deleted

## 2022-08-29 NOTE — Progress Notes (Addendum)
  Care Coordination  Note  08/29/2022 Name: ADYLINE HUBERTY MRN: [id number] DOB: 1933/10/16  BRAELEY BUSKEY is a 89 y.o. year old primary care patient of Ronnie Doss M, DO. I reached out to [name] by phone today to assist with scheduling a follow up appointment. [name] verbally consented to my assistance.       Follow up plan: Hospital Follow Up appointment scheduled with Lajuana Ripple, [place], DO) on (09/09/22) at (10:35 AM).  [place]  Direct Dial: [phone]

## 2022-08-29 NOTE — Telephone Encounter (Signed)
Scheduled patient to see PCP on 09/09/22. Patient is aware. Did not want to come next week due to possible bad weather.

## 2022-08-29 NOTE — Patient Outreach (Signed)
Care Coordination TOC Note Transition Care Management Follow-up Telephone Call Date of discharge and from where: [facility] on 08/28/22 How have you been since you were released from the hospital? "I'm feeling fine. The only symptom I really had was that I lost my appetite for a few days. I haven't felt that bad or had much of a cough." Any questions or concerns? Yes. Concerned about how long she may be contagious with RSV. Denies fever. Reviewed CDC guidelines. Typically contagious for 3-8 days but can be up to 4 weeks in young children and people with weakened immune systems. Encouraged to wash and frequently and cover cough. May want to avoid or be cautious around unvaccinated children for another week. Can follow-up provider regarding this at hosp f/u appt.   Items Reviewed: Did the pt receive and understand the discharge instructions provided? Yes  Medications obtained and verified? Yes .Medciations reconciled.  Other? Yes . Losartan was discontinued due to increasing serum creatinine and concern for AKI. Patient is hesitant to d/c due to labile blood pressure and reports that [facility] Pr[facility] with [facility] manages her blood pressure. Reviewed labs and creatinine was elevated at 1.81 but stable compared to results over the past year. Routed discharge summary to Dr Posey Pronto for review and requested that they follow-up with patient regarding d/c of losartan. She believes she is due for a yearly follow-up soon but isn't sure when. Also encouraged to take mucinex and drink plenty of water. Encouraged to use oxygen at 2L with activity. Patient reports desat to 88% with ambulation/activity but quickly recovers to >93% after resting. She really doesn't feel that she needs oxygen, but it would be helpful with activity at least. Any new allergies since your discharge? No  Dietary orders reviewed? Yes Do you have support at home? Yes   Home Care and Equipment/Supplies: Were home health services  ordered? no If so, what is the name of the agency?   Has the agency set up a time to come to the patient's home? not applicable Were any new equipment or medical supplies ordered?  Yes: Oxygen 2L What is the name of the medical supply agency? [employer] Were you able to get the supplies/equipment? Yes. Sent home with a small concentrator. Has not been outreached by [employer] for additional supplies as of yet. Do you have any questions related to the use of the equipment or supplies? No  Functional Questionnaire: (I = Independent and D = Dependent) ADLs: I  Bathing/Dressing- I  Meal Prep- I  Eating- I  Maintaining continence- I  Transferring/Ambulation- I  Managing Meds- I  Follow up appointments reviewed:  PCP Hospital f/u appt confirmed? No   Specialist Hospital f/u appt confirmed?  Not indicated but discharge summary routed to nephrologist for review and follow-up as needed on kidney functions and d/c of Losartan   Are transportation arrangements needed? No  If their condition worsens, is the pt aware to call PCP or go to the Emergency Dept.? Yes Was the patient provided with contact information for the PCP's office or ED? Yes Was to pt encouraged to call back with questions or concerns? Yes  SDOH assessments and interventions completed:   Yes SDOH Interventions Today    Flowsheet Row Most Recent Value  SDOH Interventions   Food Insecurity Interventions Intervention Not Indicated  Housing Interventions Intervention Not Indicated  Transportation Interventions Intervention Not Indicated  Utilities Interventions Intervention Not Indicated  Financial Strain Interventions Intervention Not Indicated  Care Coordination Interventions:  PCP follow up appointment requested See above for additional interventions re: oxygen, losartan, RSV precautions    Encounter Outcome:  Pt. Visit Completed    Chong Sicilian, BSN, RN-BC RN Care Coordinator [place]: [phone] Main #: [phone]

## 2022-09-02 DIAGNOSIS — J121 Respiratory syncytial virus pneumonia: Secondary | ICD-10-CM | POA: Diagnosis not present

## 2022-09-02 DIAGNOSIS — J9601 Acute respiratory failure with hypoxia: Secondary | ICD-10-CM | POA: Diagnosis not present

## 2022-09-02 DIAGNOSIS — I5033 Acute on chronic diastolic (congestive) heart failure: Secondary | ICD-10-CM | POA: Diagnosis not present

## 2022-09-02 DIAGNOSIS — K219 Gastro-esophageal reflux disease without esophagitis: Secondary | ICD-10-CM | POA: Diagnosis not present

## 2022-09-07 DIAGNOSIS — I38 Endocarditis, valve unspecified: Secondary | ICD-10-CM | POA: Insufficient documentation

## 2022-09-07 NOTE — Progress Notes (Unsigned)
Cardiology Office Note   Date:  09/10/2022   ID:  Azilee, Pirro 1934-06-26, MRN [id number]  PCP:  [name], DO  Cardiologist:   Minus Breeding, MD   Chief Complaint  Patient presents with   Shortness of Breath      History of Present Illness: [name] is a 89 y.o. female who presents with difficult to control with chronic diastolic CHF, and arrhythmias.  She was in the hospital with hypoxic respiratory failure in October.   I reviewed these records for this visit.  She had pneumonia and a CHF exacerbation.   She had mild AKI with her creat being 1.56 at discharge which was down from 1.93.   She was discharged on O2.  She was again in the hospital on January ninth 2024 with pneumonia.  I reviewed these records for this admission.  She was sent home on 2 L of oxygen again.  She had some acute on chronic renal insufficiency with a peak creatinine 1.93 and she was mildly hyperkalemic.  He did have follow-up labs yesterday and creatinine is back down to baseline 1.67 and she was not hyperkalemic.  She has not required the oxygen at home.  Her sats have been in the 93 to 94% range.  She was not supposed to be taking her losartanShe had labs yesterday and her creat was 1.67 with normal potassium.  She was not supposed to be taking her Cozaar at discharge but she has been because of the increased BPs.  She is not using the O2 because her sats are in the mid 90s.  The patient denies any new symptoms such as chest discomfort, neck or arm discomfort. There has been no new shortness of breath, PND or orthopnea. There have been no reported palpitations, presyncope or syncope.    Past Medical History:  Diagnosis Date   Anemia    Arthritis    Asthma    Atrial fibrillation ([place])    Basal cell carcinoma 02/06/2009   Left ear targus- ([facility])   Basal cell carcinoma 09/28/2008   Right back-(CX35FU)   CKD (chronic kidney disease), stage III (HCC)    Heart murmur     Hyperlipidemia    Hypertension    Hypothyroidism    Nodule of right lung    Right upper lobe   Pneumonia    PONV (postoperative nausea and vomiting)    Renal insufficiency    Chronic   Renal vascular disease    Right renal artery stenosis ([place]) 05/09/2015   Squamous cell carcinoma of skin 09/07/2019   KA-Left shin-txpbx   Vertigo     Past Surgical History:  Procedure Laterality Date   BIOPSY  04/09/2021   Procedure: BIOPSY;  Surgeon: Harvel Quale, MD;  [facility];  Service: Gastroenterology;;   BREAST LUMPECTOMY WITH RADIOACTIVE SEED LOCALIZATION Right 04/08/2018   Procedure: BREAST LUMPECTOMY WITH RADIOACTIVE SEED LOCALIZATION;  Surgeon: Rolm Bookbinder, MD;  Location: [facility];  Service: General;  Laterality: Right;   COLONOSCOPY  2016   diverticulosis, hemorrhoids   ESOPHAGOGASTRODUODENOSCOPY  2016   normal   ESOPHAGOGASTRODUODENOSCOPY (EGD) WITH PROPOFOL N/A 04/09/2021   Procedure: ESOPHAGOGASTRODUODENOSCOPY (EGD) WITH PROPOFOL;  Surgeon: Harvel Quale, MD;  [facility];  Service: Gastroenterology;  Laterality: N/A;  12:45   ESOPHAGOGASTRODUODENOSCOPY (EGD) WITH PROPOFOL N/A 09/01/2021   Procedure: ESOPHAGOGASTRODUODENOSCOPY (EGD) WITH PROPOFOL;  Surgeon: Eloise Harman, DO;  [facility];  Service: Endoscopy;  Laterality: N/A;   IR GENERIC HISTORICAL  08/29/2016   IR US GUIDE VASC ACCESS RIGHT 08/29/2016 [facility]   IR GENERIC HISTORICAL  08/29/2016   IR RENAL BILAT S&I MOD SED 08/29/2016 [facility]   KNEE ARTHROSCOPY Right    2019   THYROIDECTOMY, PARTIAL Right 1981   middle lobe removed 1st, then right lobe removed 7-8 years later (approx. 1988)     Current Outpatient Medications  Medication Sig Dispense Refill   acetaminophen (TYLENOL) 325 MG tablet Take 325 mg by mouth daily as needed for moderate pain or headache.     atorvastatin (LIPITOR) 20 MG tablet TAKE 1/2 TABLET BY MOUTH EVERY DAY 45 tablet  1   bisoprolol (ZEBETA) 5 MG tablet TAKE 1 TABLET (5 MG TOTAL) BY MOUTH DAILY. 90 tablet 3   cholecalciferol (VITAMIN D) 1000 units tablet Take 1,000 Units by mouth daily.     cloNIDine (CATAPRES) 0.1 MG tablet Take 1 tablet (0.1 mg total) by mouth as directed. May take one tablet as needed with systolic blood pressure greater than 170 30 tablet 1   feeding supplement (ENSURE ENLIVE / ENSURE PLUS) LIQD Take 237 mLs by mouth 2 (two) times daily between meals. 237 mL 12   ferrous sulfate 325 (65 FE) MG tablet Take 325 mg by mouth 2 (two) times daily with a meal.     flecainide (TAMBOCOR) 50 MG tablet Take 1 tablet (50 mg total) by mouth 2 (two) times daily. 180 tablet 3   hydrALAZINE (APRESOLINE) 100 MG tablet Take 100 mg by mouth 2 (two) times daily.     letrozole (FEMARA) 2.5 MG tablet TAKE 1 TABLET BY MOUTH EVERY DAY 90 tablet 4   levocetirizine (XYZAL) 5 MG tablet TAKE 1 TABLET (5 MG TOTAL) BY MOUTH DAILY AS NEEDED FOR ALLERGIES (RHINITIS). 90 tablet 1   levothyroxine (SYNTHROID) 75 MCG tablet TAKE 1 TABLET BY MOUTH EVERY DAY 90 tablet 3   losartan (COZAAR) 25 MG tablet Take 25 mg by mouth daily.     mirtazapine (REMERON) 7.5 MG tablet TAKE 1 TABLET BY MOUTH EVERYDAY AT BEDTIME 90 tablet 1   Multiple Vitamin (MULTIVITAMIN) tablet Take 1 tablet by mouth daily.     pantoprazole (PROTONIX) 40 MG tablet Take 1 tablet (40 mg total) by mouth daily. 90 tablet 3   OXYGEN Inhale 2 L/min into the lungs as needed.     No current facility-administered medications for this visit.    Allergies:   Amlodipine, Lisinopril, Xeroform occlusive gauze strip [bismuth tribromoph-petrolatum], Isosorbide nitrate, Sulfonamide derivatives, and Terazosin    ROS:  Please see the history of present illness.   Otherwise, review of systems are positive for none.   All other systems are reviewed and negative.    PHYSICAL EXAM: VS:  BP (!) 140/80   Pulse 64   Ht 5' 5.5" (1.664 m)   Wt 107 lb (48.5 kg)   BMI 17.53  kg/m  , BMI Body mass index is 17.53 kg/m. GENERAL:  Well appearing NECK:  No jugular venous distention, waveform within normal limits, carotid upstroke brisk and symmetric, no bruits, no thyromegaly LUNGS:  Clear to auscultation bilaterally CHEST:  Unremarkable HEART:  PMI not displaced or sustained,S1 and S2 within normal limits, no S3, no S4, no clicks, no rubs, 3 out of 6 apical systolic murmur radiating of the aortic outflow tract and mid peaking, no diastolic murmurs ABD:  Flat, positive bowel sounds normal in frequency in pitch,  no bruits, no rebound, no guarding, no midline pulsatile mass, no hepatomegaly, no splenomegaly EXT:  2 plus pulses throughout, no edema, no cyanosis no clubbing   EKG:  EKG is not ordered today. The ekg ordered 06/16/2022 demonstrates sinus rhythm, rate 80, axis within normal limits, QTC prolonged but stable   Recent Labs: 06/16/2022: B Natriuretic Peptide 547.0 08/27/2022: ALT 10; TSH 1.497 08/28/2022: Magnesium 2.0 09/09/2022: BUN 25; Creatinine, Ser 1.67; Hemoglobin 11.0; Platelets 91; Potassium 4.1; Sodium 138    Lipid Panel    Component Value Date/Time   CHOL 127 06/05/2022 0844   CHOL 159 01/13/2013 1042   TRIG 64 06/05/2022 0844   TRIG 84 03/10/2016 0928   TRIG 142 01/13/2013 1042   HDL 53 06/05/2022 0844   HDL 56 03/10/2016 0928   HDL 54 01/13/2013 1042   CHOLHDL 2.4 06/05/2022 0844   CHOLHDL 4.6 06/05/2007 0125   VLDL 54 (H) 06/05/2007 0125   LDLCALC 61 06/05/2022 0844   LDLCALC 76 03/14/2014 0822   LDLCALC 77 01/13/2013 1042      Wt Readings from Last 3 Encounters:  09/10/22 107 lb (48.5 kg)  09/09/22 107 lb 6.4 oz (48.7 kg)  08/26/22 107 lb (48.5 kg)      Other studies Reviewed: Additional studies/ records that were reviewed today include: Hospital records Review of the above records demonstrates:  Please see elsewhere in the note.      ASSESSMENT AND PLAN:   HFpEF:   She has not required any diuretic.  This diagnosis  was made in the hospital in October.  We talked about salt restriction.  We talked about judicious use of fluid intake.  She has to balance this mild diagnosis against her renal insufficiency.  Atrial tachycardia/PAF:   She has had no sustained symptomatic tachyarrhythmias and is lower risk and is not on anticoagulant.   HTN : Blood pressure is upper limits today but she has been seeing some systolics in the 948N.  She said she did well in the past taking as needed clonidine and would like to try this again and I think it is reasonable.  She will let me know if she is having to do this frequently.   Thrombocytopenia/leukopenia :   She remains pancytopenic and had labs yesterday.  She sees hematology.   Ao Valve stenosis and MR: This was mild in January 2023 I will follow-up probably next year with repeat echo.  Current medicines are reviewed at length with the patient today.  The patient does not have concerns regarding medicines.  The following changes have been made: None  Labs/ tests ordered today include: None  No orders of the defined types were placed in this encounter.    Disposition:   FU with in 6 months.    Signed, Minus Breeding, MD  09/10/2022 10:53 AM    [facility]

## 2022-09-09 ENCOUNTER — Ambulatory Visit (INDEPENDENT_AMBULATORY_CARE_PROVIDER_SITE_OTHER): Payer: PPO | Admitting: Family Medicine

## 2022-09-09 ENCOUNTER — Telehealth: Payer: Self-pay | Admitting: Family Medicine

## 2022-09-09 ENCOUNTER — Encounter: Payer: Self-pay | Admitting: Family Medicine

## 2022-09-09 VITALS — BP 147/56 | HR 60 | Temp 98.5°F | Ht 65.0 in | Wt 107.4 lb

## 2022-09-09 DIAGNOSIS — R630 Anorexia: Secondary | ICD-10-CM

## 2022-09-09 DIAGNOSIS — Z09 Encounter for follow-up examination after completed treatment for conditions other than malignant neoplasm: Secondary | ICD-10-CM

## 2022-09-09 DIAGNOSIS — J121 Respiratory syncytial virus pneumonia: Secondary | ICD-10-CM | POA: Diagnosis not present

## 2022-09-09 DIAGNOSIS — N1832 Chronic kidney disease, stage 3b: Secondary | ICD-10-CM

## 2022-09-09 DIAGNOSIS — J9601 Acute respiratory failure with hypoxia: Secondary | ICD-10-CM | POA: Diagnosis not present

## 2022-09-09 LAB — CBC

## 2022-09-09 NOTE — Telephone Encounter (Signed)
Called and spoke with pt and she is concerned about her losartan 25 MG tablet (COZAAR) states when she was at the ED , she wants to know the reason they did take her off and also would like to know your opinion on it , because they did not give her anything else in replace of it   Please advise

## 2022-09-09 NOTE — Progress Notes (Signed)
Subjective: CC:Hospital discharge  PCP: [name], DO SWN:IOEVO B Semrad is a 89 y.o. female presenting to clinic today for:  1. RSV pneumonia Patient reports that she actually went to hospital for poor appetite.  She was found to have RSV pneumonia.  She has O2 at home but notes her levels have been normal so she has not been using. Denies cough, SOB, fever.  Continues to have poor appetite.  Using mirtazapine 7.'5mg'$  daily.  Home BPs running 104-161/60-70.   ROS: Per HPI  Allergies  Allergen Reactions   Amlodipine Other (See Comments)    edema   Lisinopril     cough   Xeroform Occlusive Gauze Strip [Bismuth Tribromoph-Petrolatum]    Isosorbide Nitrate Nausea Only   Sulfonamide Derivatives Nausea Only and Rash   Terazosin Hives and Nausea Only   Past Medical History:  Diagnosis Date   Anemia    Arthritis    Asthma    Atrial fibrillation ([place])    Basal cell carcinoma 02/06/2009   Left ear targus- ([facility])   Basal cell carcinoma 09/28/2008   Right back-(CX35FU)   CKD (chronic kidney disease), stage III (HCC)    Heart murmur    Hyperlipidemia    Hypertension    Hypothyroidism    Nodule of right lung    Right upper lobe   Pneumonia    PONV (postoperative nausea and vomiting)    Renal insufficiency    Chronic   Renal vascular disease    Right renal artery stenosis (HCC) 05/09/2015   Squamous cell carcinoma of skin 09/07/2019   KA-Left shin-txpbx   Vertigo     Current Outpatient Medications:    acetaminophen (TYLENOL) 325 MG tablet, Take 325 mg by mouth daily as needed for moderate pain or headache., Disp: , Rfl:    atorvastatin (LIPITOR) 20 MG tablet, TAKE 1/2 TABLET BY MOUTH EVERY DAY, Disp: 45 tablet, Rfl: 1   bisoprolol (ZEBETA) 5 MG tablet, TAKE 1 TABLET (5 MG TOTAL) BY MOUTH DAILY., Disp: 90 tablet, Rfl: 3   cholecalciferol (VITAMIN D) 1000 units tablet, Take 1,000 Units by mouth daily., Disp: , Rfl:    feeding supplement (ENSURE ENLIVE / ENSURE PLUS)  LIQD, Take 237 mLs by mouth 2 (two) times daily between meals., Disp: 237 mL, Rfl: 12   ferrous sulfate 325 (65 FE) MG tablet, Take 325 mg by mouth 2 (two) times daily with a meal., Disp: , Rfl:    flecainide (TAMBOCOR) 50 MG tablet, Take 1 tablet (50 mg total) by mouth 2 (two) times daily., Disp: 180 tablet, Rfl: 3   guaiFENesin (MUCINEX) 600 MG 12 hr tablet, Take 1 tablet (600 mg total) by mouth 2 (two) times daily., Disp: 60 tablet, Rfl: 2   hydrALAZINE (APRESOLINE) 100 MG tablet, Take 100 mg by mouth 2 (two) times daily., Disp: , Rfl:    letrozole (FEMARA) 2.5 MG tablet, TAKE 1 TABLET BY MOUTH EVERY DAY, Disp: 90 tablet, Rfl: 4   levocetirizine (XYZAL) 5 MG tablet, TAKE 1 TABLET (5 MG TOTAL) BY MOUTH DAILY AS NEEDED FOR ALLERGIES (RHINITIS)., Disp: 90 tablet, Rfl: 1   levothyroxine (SYNTHROID) 75 MCG tablet, TAKE 1 TABLET BY MOUTH EVERY DAY, Disp: 90 tablet, Rfl: 3   mirtazapine (REMERON) 7.5 MG tablet, TAKE 1 TABLET BY MOUTH EVERYDAY AT BEDTIME, Disp: 90 tablet, Rfl: 1   Multiple Vitamin (MULTIVITAMIN) tablet, Take 1 tablet by mouth daily., Disp: , Rfl:    OXYGEN, Inhale 2 L/min into the lungs  as needed., Disp: , Rfl:    pantoprazole (PROTONIX) 40 MG tablet, Take 1 tablet (40 mg total) by mouth daily., Disp: 90 tablet, Rfl: 3 Social History   Socioeconomic History   Marital status: Widowed    Spouse name: Not on file   Number of children: 2   Years of education: 59   Highest education level: Not on file  Occupational History   Occupation: Retired  Tobacco Use   Smoking status: Never   Smokeless tobacco: Never  Vaping Use   Vaping Use: Never used  Substance and Sexual Activity   Alcohol use: No   Drug use: No   Sexual activity: Never  Other Topics Concern   Not on file  Social History Narrative   Lives alone, but her daughter stays there at night   Ambidextrous (writes with left hand).   No caffeine use.   Social Determinants of Health   Financial Resource Strain: Low Risk   (08/29/2022)   Overall Financial Resource Strain (CARDIA)    Difficulty of Paying Living Expenses: Not hard at all  Food Insecurity: No Food Insecurity (08/29/2022)   Hunger Vital Sign    Worried About Running Out of Food in the Last Year: Never true    Ran Out of Food in the Last Year: Never true  Transportation Needs: No Transportation Needs (08/29/2022)   PRAPARE - Hydrologist (Medical): No    Lack of Transportation (Non-Medical): No  Physical Activity: Inactive (04/26/2021)   Exercise Vital Sign    Days of Exercise per Week: 0 days    Minutes of Exercise per Session: 0 min  Stress: No Stress Concern Present (04/26/2021)   [place]    Feeling of Stress : Not at all  Social Connections: Moderately Isolated (04/26/2021)   Social Connection and Isolation Panel [NHANES]    Frequency of Communication with Friends and Family: More than three times a week    Frequency of Social Gatherings with Friends and Family: More than three times a week    Attends Religious Services: More than 4 times per year    Active Member of [employer] or Organizations: No    Attends Archivist Meetings: Never    Marital Status: Widowed  Intimate Partner Violence: Not At Risk (08/27/2022)   Humiliation, Afraid, Rape, and Kick questionnaire    Fear of Current or Ex-Partner: No    Emotionally Abused: No    Physically Abused: No    Sexually Abused: No   Family History  Problem Relation Age of Onset   Thyroid disease Mother    Stroke Father    Hypertension Brother    Lung cancer Maternal Uncle    Cancer Maternal Grandmother        Liver   Lung cancer Maternal Uncle    Hypertension Daughter    Hypercholesterolemia Daughter    Hypercholesterolemia Daughter     Objective: Office vital signs reviewed. BP (!) 147/56   Pulse 60   Temp 98.5 F (36.9 C)   Ht '5\' 5"'$  (1.651 m)   Wt 107 lb 6.4 oz (48.7 kg)   SpO2  93%   BMI 17.87 kg/m   Physical Examination:  General: Awake, alert, thin elderly female, No acute distress HEENT: sclera white, MMM Cardio: regular rate and rhythm, S1S2 heard, 3/6 SE murmur present Pulm: clear to auscultation bilaterally, no wheezes, rhonchi or rales; normal work of breathing on room  air    Assessment/ Plan: 89 y.o. female   Acute respiratory failure with hypoxia ([place]) - Plan: Basic Metabolic Panel, [facility] discharge follow-up - Plan: Basic Metabolic Panel, CBC  RSV (respiratory syncytial virus pneumonia) - Plan: Basic Metabolic Panel, CBC  Poor appetite  Stage 3b chronic kidney disease (HCC)  Normal O2 and lung exam today.  Check BMP, CBC.  Discussed meal planning, use of renal friendly boost.  Stay off ARB for now given AKI.  BP borderline.  May need to reinitiate at lower dose.  No orders of the defined types were placed in this encounter.  No orders of the defined types were placed in this encounter.   Today's visit is for Transitional Care Management.  The patient was discharged from [facility] on 08/28/2022  with a primary diagnosis of PNA/ RSV.   Contact with the patient and/or caregiver, by a clinical staff member, was made on 08/29/2022 and was documented as a telephone encounter within the EMR.  Through chart review and discussion with the patient I have determined that management of their condition is of moderate complexity.    [name], DO [place] [phone]

## 2022-09-09 NOTE — Telephone Encounter (Signed)
Patient has questions about a medication. No other info given.

## 2022-09-09 NOTE — Telephone Encounter (Signed)
Yes, it was stopped due to kidney function looking worse.  We'll plan for a repeat BP with nurse in 2 weeks.  If BP going above 140/90, plan for possible re initiation if kidney function looks ok

## 2022-09-10 ENCOUNTER — Encounter: Payer: Self-pay | Admitting: Cardiology

## 2022-09-10 ENCOUNTER — Ambulatory Visit (INDEPENDENT_AMBULATORY_CARE_PROVIDER_SITE_OTHER): Payer: PPO | Admitting: Cardiology

## 2022-09-10 VITALS — BP 140/80 | HR 64 | Ht 65.5 in | Wt 107.0 lb

## 2022-09-10 DIAGNOSIS — I10 Essential (primary) hypertension: Secondary | ICD-10-CM | POA: Diagnosis not present

## 2022-09-10 DIAGNOSIS — I509 Heart failure, unspecified: Secondary | ICD-10-CM | POA: Diagnosis not present

## 2022-09-10 DIAGNOSIS — I5032 Chronic diastolic (congestive) heart failure: Secondary | ICD-10-CM | POA: Diagnosis not present

## 2022-09-10 DIAGNOSIS — I38 Endocarditis, valve unspecified: Secondary | ICD-10-CM

## 2022-09-10 LAB — BASIC METABOLIC PANEL
BUN/Creatinine Ratio: 15 (ref 12–28)
BUN: 25 mg/dL (ref 8–27)
CO2: 26 mmol/L (ref 20–29)
Calcium: 9.6 mg/dL (ref 8.7–10.3)
Chloride: 97 mmol/L (ref 96–106)
Creatinine, Ser: 1.67 mg/dL — ABNORMAL HIGH (ref 0.57–1.00)
Glucose: 123 mg/dL — ABNORMAL HIGH (ref 70–99)
Potassium: 4.1 mmol/L (ref 3.5–5.2)
Sodium: 138 mmol/L (ref 134–144)
eGFR: 30 mL/min/{1.73_m2} — ABNORMAL LOW (ref >59)

## 2022-09-10 LAB — CBC
Hematocrit: 34.1 % (ref 34.0–46.6)
Hemoglobin: 11 g/dL — ABNORMAL LOW (ref 11.1–15.9)
MCH: 27.8 pg (ref 26.6–33.0)
MCHC: 32.3 g/dL (ref 31.5–35.7)
MCV: 86 fL (ref 79–97)
Platelets: 91 10*3/uL — CL (ref 150–450)
RBC: 3.95 x10E6/uL (ref 3.77–5.28)
RDW: 14 % (ref 11.7–15.4)
WBC: 2.6 10*3/uL — ABNORMAL LOW (ref 3.4–10.8)

## 2022-09-10 MED ORDER — CLONIDINE HCL 0.1 MG PO TABS: 0.1000 mg | ORAL_TABLET | ORAL | 1 refills | Status: DC

## 2022-09-10 NOTE — Telephone Encounter (Signed)
Pt has been notified.

## 2022-09-10 NOTE — Patient Instructions (Signed)
Medication Instructions:  You may take Clonidine 0.1 mg as needed for systolic (top number) blood pressure greater than 170. Continue all other medications as listed.  *If you need a refill on your cardiac medications before your next appointment, please call your pharmacy*  Follow-Up: At [facility], you and your health needs are our priority.  As part of our continuing mission to provide you with exceptional heart care, we have created designated Provider Care Teams.  These Care Teams include your primary Cardiologist (physician) and Advanced Practice Providers (APPs -  Physician Assistants and Nurse Practitioners) who all work together to provide you with the care you need, when you need it.  We recommend signing up for the patient portal called "MyChart".  Sign up information is provided on this After Visit Summary.  MyChart is used to connect with patients for Virtual Visits (Telemedicine).  Patients are able to view lab/test results, encounter notes, upcoming appointments, etc.  Non-urgent messages can be sent to your provider as well.   To learn more about what you can do with MyChart, go to [email or web address].    Your next appointment:   6 month(s)  Provider:   Minus Breeding, MD

## 2022-09-17 NOTE — Progress Notes (Unsigned)
Referring Provider: [name], DO Primary Care Physician:  [name], DO Primary GI Physician: Dr. Jenetta Downer  No chief complaint on file.   HPI:   [name] is a 89 y.o. female with past medical history of asthma, a fib, CKD, HLD, HTN, hypothyroidism, breast cancer, normocytic anemia/pancytopenia following with heme/onc, decreased appetite on Remeron, Candida esophagitis s/p treatment with Diflucan x 21 days August 2022, duodenal ulcer in January 2023,   Last seen 12/02/2021.  She was doing well at that time.  Had no significant GI symptoms.  She was gaining weight.  Advised to decrease Protonix to 40 mg once daily, avoid NSAIDs, follow-up in 1 year.   Today:      Last Colonoscopy:(2016) diverticulosis, hemorrhoids Last Endoscopy:09/01/21 Esophageal plaques were found, suspicious for candidiasis. Biopsied-neg for candidiasis, consistent with reflux disease - Gastritis. Biopsied-mild chronic gastritis, neg for H pylori - Mucosal variant in the duodenum. - Non-bleeding duodenal ulcer with no stigmata of bleeding. - Mild Schatzki ring.     Past Medical History:  Diagnosis Date   Anemia    Arthritis    Asthma    Atrial fibrillation ([place])    Basal cell carcinoma 02/06/2009   Left ear targus- ([facility])   Basal cell carcinoma 09/28/2008   Right back-(CX35FU)   CKD (chronic kidney disease), stage III (HCC)    Heart murmur    Hyperlipidemia    Hypertension    Hypothyroidism    Nodule of right lung    Right upper lobe   Pneumonia    PONV (postoperative nausea and vomiting)    Renal insufficiency    Chronic   Renal vascular disease    Right renal artery stenosis ([place]) 05/09/2015   Squamous cell carcinoma of skin 09/07/2019   KA-Left shin-txpbx   Vertigo     Past Surgical History:  Procedure Laterality Date   BIOPSY  04/09/2021   Procedure: BIOPSY;  Surgeon: Harvel Quale, MD;  [facility];  Service: Gastroenterology;;    BREAST LUMPECTOMY WITH RADIOACTIVE SEED LOCALIZATION Right 04/08/2018   Procedure: BREAST LUMPECTOMY WITH RADIOACTIVE SEED LOCALIZATION;  Surgeon: Rolm Bookbinder, MD;  Location: [facility] [place];  Service: General;  Laterality: Right;   COLONOSCOPY  2016   diverticulosis, hemorrhoids   ESOPHAGOGASTRODUODENOSCOPY  2016   normal   ESOPHAGOGASTRODUODENOSCOPY (EGD) WITH PROPOFOL N/A 04/09/2021   Procedure: ESOPHAGOGASTRODUODENOSCOPY (EGD) WITH PROPOFOL;  Surgeon: Harvel Quale, MD;  [facility];  Service: Gastroenterology;  Laterality: N/A;  12:45   ESOPHAGOGASTRODUODENOSCOPY (EGD) WITH PROPOFOL N/A 09/01/2021   Procedure: ESOPHAGOGASTRODUODENOSCOPY (EGD) WITH PROPOFOL;  Surgeon: Eloise Harman, DO;  [facility];  Service: Endoscopy;  Laterality: N/A;   IR GENERIC HISTORICAL  08/29/2016   IR US GUIDE VASC ACCESS RIGHT 08/29/2016 [facility]   IR GENERIC HISTORICAL  08/29/2016   IR RENAL BILAT S&I MOD SED 08/29/2016 [facility]   KNEE ARTHROSCOPY Right    2019   THYROIDECTOMY, PARTIAL Right 1981   middle lobe removed 1st, then right lobe removed 7-8 years later (approx. 1988)    Current Outpatient Medications  Medication Sig Dispense Refill   acetaminophen (TYLENOL) 325 MG tablet Take 325 mg by mouth daily as needed for moderate pain or headache.     atorvastatin (LIPITOR) 20 MG tablet TAKE 1/2 TABLET BY MOUTH EVERY DAY 45 tablet 1   bisoprolol (ZEBETA) 5 MG tablet TAKE 1 TABLET (5 MG TOTAL) BY MOUTH DAILY. 90 tablet 3  cholecalciferol (VITAMIN D) 1000 units tablet Take 1,000 Units by mouth daily.     cloNIDine (CATAPRES) 0.1 MG tablet Take 1 tablet (0.1 mg total) by mouth as directed. May take one tablet as needed with systolic blood pressure greater than 170 30 tablet 1   feeding supplement (ENSURE ENLIVE / ENSURE PLUS) LIQD Take 237 mLs by mouth 2 (two) times daily between meals. 237 mL 12   ferrous sulfate 325 (65 FE) MG tablet Take 325 mg by mouth 2  (two) times daily with a meal.     flecainide (TAMBOCOR) 50 MG tablet Take 1 tablet (50 mg total) by mouth 2 (two) times daily. 180 tablet 3   hydrALAZINE (APRESOLINE) 100 MG tablet Take 100 mg by mouth 2 (two) times daily.     letrozole (FEMARA) 2.5 MG tablet TAKE 1 TABLET BY MOUTH EVERY DAY 90 tablet 4   levocetirizine (XYZAL) 5 MG tablet TAKE 1 TABLET (5 MG TOTAL) BY MOUTH DAILY AS NEEDED FOR ALLERGIES (RHINITIS). 90 tablet 1   levothyroxine (SYNTHROID) 75 MCG tablet TAKE 1 TABLET BY MOUTH EVERY DAY 90 tablet 3   losartan (COZAAR) 25 MG tablet Take 25 mg by mouth daily.     mirtazapine (REMERON) 7.5 MG tablet TAKE 1 TABLET BY MOUTH EVERYDAY AT BEDTIME 90 tablet 1   Multiple Vitamin (MULTIVITAMIN) tablet Take 1 tablet by mouth daily.     OXYGEN Inhale 2 L/min into the lungs as needed.     pantoprazole (PROTONIX) 40 MG tablet Take 1 tablet (40 mg total) by mouth daily. 90 tablet 3   No current facility-administered medications for this visit.    Allergies as of 09/18/2022 - Review Complete 09/10/2022  Allergen Reaction Noted   Amlodipine Other (See Comments) 10/24/2015   Lisinopril  09/30/2021   Xeroform occlusive gauze strip [bismuth tribromoph-petrolatum]  05/30/2021   Isosorbide nitrate Nausea Only 06/19/2014   Sulfonamide derivatives Nausea Only and Rash 12/19/2009   Terazosin Hives and Nausea Only 06/10/2013    Family History  Problem Relation Age of Onset   Thyroid disease Mother    Stroke Father    Hypertension Brother    Lung cancer Maternal Uncle    Cancer Maternal Grandmother        Liver   Lung cancer Maternal Uncle    Hypertension Daughter    Hypercholesterolemia Daughter    Hypercholesterolemia Daughter     Social History   Socioeconomic History   Marital status: Widowed    Spouse name: Not on file   Number of children: 2   Years of education: 84   Highest education level: Not on file  Occupational History   Occupation: Retired  Tobacco Use   Smoking  status: Never   Smokeless tobacco: Never  Vaping Use   Vaping Use: Never used  Substance and Sexual Activity   Alcohol use: No   Drug use: No   Sexual activity: Never  Other Topics Concern   Not on file  Social History Narrative   Lives alone, but her daughter stays there at night   Ambidextrous (writes with left hand).   No caffeine use.   Social Determinants of Health   Financial Resource Strain: Low Risk  (08/29/2022)   Overall Financial Resource Strain (CARDIA)    Difficulty of Paying Living Expenses: Not hard at all  Food Insecurity: No Food Insecurity (08/29/2022)   Hunger Vital Sign    Worried About Running Out of Food in the Last Year: Never true  Ran Out of Food in the Last Year: Never true  Transportation Needs: No Transportation Needs (08/29/2022)   PRAPARE - Hydrologist (Medical): No    Lack of Transportation (Non-Medical): No  Physical Activity: Inactive (04/26/2021)   Exercise Vital Sign    Days of Exercise per Week: 0 days    Minutes of Exercise per Session: 0 min  Stress: No Stress Concern Present (04/26/2021)   [place]    Feeling of Stress : Not at all  Social Connections: Moderately Isolated (04/26/2021)   Social Connection and Isolation Panel [NHANES]    Frequency of Communication with Friends and Family: More than three times a week    Frequency of Social Gatherings with Friends and Family: More than three times a week    Attends Religious Services: More than 4 times per year    Active Member of [employer] or Organizations: No    Attends Archivist Meetings: Never    Marital Status: Widowed    Review of Systems: Gen: Denies fever, chills, anorexia. Denies fatigue, weakness, weight loss.  CV: Denies chest pain, palpitations, syncope, peripheral edema, and claudication. Resp: Denies dyspnea at rest, cough, wheezing, coughing up blood, and pleurisy. GI:  Denies vomiting blood, jaundice, and fecal incontinence.   Denies dysphagia or odynophagia. Derm: Denies rash, itching, dry skin Psych: Denies depression, anxiety, memory loss, confusion. No homicidal or suicidal ideation.  Heme: Denies bruising, bleeding, and enlarged lymph nodes.  Physical Exam: There were no vitals taken for this visit. General:   Alert and oriented. No distress noted. Pleasant and cooperative.  Head:  Normocephalic and atraumatic. Eyes:  Conjuctiva clear without scleral icterus. Heart:  S1, S2 present without murmurs appreciated. Lungs:  Clear to auscultation bilaterally. No wheezes, rales, or rhonchi. No distress.  Abdomen:  +BS, soft, non-tender and non-distended. No rebound or guarding. No HSM or masses noted. Msk:  Symmetrical without gross deformities. Normal posture. Extremities:  Without edema. Neurologic:  Alert and  oriented x4 Psych:  Normal mood and affect.    Assessment:     Plan:  ***   Aliene Altes, PA-C [facility] Gastroenter[facility] 09/18/2022

## 2022-09-18 ENCOUNTER — Ambulatory Visit (INDEPENDENT_AMBULATORY_CARE_PROVIDER_SITE_OTHER): Payer: PPO | Admitting: Gastroenterology

## 2022-09-18 ENCOUNTER — Encounter: Payer: Self-pay | Admitting: Gastroenterology

## 2022-09-18 VITALS — BP 196/100 | HR 64 | Temp 98.0°F | Ht 65.0 in | Wt 109.8 lb

## 2022-09-18 DIAGNOSIS — R1013 Epigastric pain: Secondary | ICD-10-CM | POA: Diagnosis not present

## 2022-09-18 DIAGNOSIS — R630 Anorexia: Secondary | ICD-10-CM

## 2022-09-18 DIAGNOSIS — I10 Essential (primary) hypertension: Secondary | ICD-10-CM

## 2022-09-18 MED ORDER — PANTOPRAZOLE SODIUM 40 MG PO TBEC: 40.0000 mg | DELAYED_RELEASE_TABLET | Freq: Two times a day (BID) | ORAL | 3 refills | Status: DC

## 2022-09-18 NOTE — Patient Instructions (Addendum)
Increase pantoprazole to 40 mg twice daily 30 minutes before breakfast and dinner.  Let me know if you continue to have decreased appetite or burning in the upper abdomen and we can proceed with an upper endoscopy for further evaluation.  Continue to avoid all NSAID products.  Try eating 3 meals a day to maintain weight.   Add 1 protein shake in the morning with breakfast and evening with dinner until you are back to eating normally.   Your blood pressure is quite elevated today at 194/72.  As we discussed, this is considered hypertensive urgency and should be evaluated in the emergency room.  As you have requested to go home for now and take your as needed clonidine, I feel this is reasonable.  You need to continue to monitor your blood pressure this evening and if the top number continues to be over 180 or the bottom number over 100, you need to seek medical attention tonight.  Keep your follow-up appointment with your nephrologist in the morning for blood pressure management.  We will plan to follow-up with you in the office in 3 months or sooner if needed.  Aliene Altes, PA-C [facility] Gastroenterology

## 2022-09-19 ENCOUNTER — Other Ambulatory Visit: Payer: Self-pay

## 2022-09-19 ENCOUNTER — Encounter (HOSPITAL_COMMUNITY): Payer: Self-pay | Admitting: Emergency Medicine

## 2022-09-19 ENCOUNTER — Emergency Department (HOSPITAL_COMMUNITY)
Admission: EM | Admit: 2022-09-19 | Discharge: 2022-09-20 | Disposition: A | Discharge status: Home / Self Care | Payer: PPO | Attending: Emergency Medicine | Admitting: Emergency Medicine

## 2022-09-19 DIAGNOSIS — N2889 Other specified disorders of kidney and ureter: Secondary | ICD-10-CM | POA: Diagnosis not present

## 2022-09-19 DIAGNOSIS — N2581 Secondary hyperparathyroidism of renal origin: Secondary | ICD-10-CM | POA: Diagnosis not present

## 2022-09-19 DIAGNOSIS — D649 Anemia, unspecified: Secondary | ICD-10-CM | POA: Diagnosis not present

## 2022-09-19 DIAGNOSIS — I10 Essential (primary) hypertension: Secondary | ICD-10-CM | POA: Insufficient documentation

## 2022-09-19 DIAGNOSIS — J449 Chronic obstructive pulmonary disease, unspecified: Secondary | ICD-10-CM | POA: Diagnosis not present

## 2022-09-19 DIAGNOSIS — I1A0 Resistant hypertension: Secondary | ICD-10-CM | POA: Diagnosis not present

## 2022-09-19 DIAGNOSIS — Z79899 Other long term (current) drug therapy: Secondary | ICD-10-CM | POA: Insufficient documentation

## 2022-09-19 DIAGNOSIS — I509 Heart failure, unspecified: Secondary | ICD-10-CM | POA: Diagnosis not present

## 2022-09-19 DIAGNOSIS — N183 Chronic kidney disease, stage 3 unspecified: Secondary | ICD-10-CM | POA: Diagnosis not present

## 2022-09-19 DIAGNOSIS — E785 Hyperlipidemia, unspecified: Secondary | ICD-10-CM | POA: Diagnosis not present

## 2022-09-19 DIAGNOSIS — I129 Hypertensive chronic kidney disease with stage 1 through stage 4 chronic kidney disease, or unspecified chronic kidney disease: Secondary | ICD-10-CM | POA: Diagnosis not present

## 2022-09-19 NOTE — ED Notes (Addendum)
Family updated as to patient's status. Requested to be updated at RN's earliest convenience once placed in a room.

## 2022-09-19 NOTE — ED Triage Notes (Addendum)
Pt via POV c/o HTN with systolic greater than 689. Pt went to her kidney doctor today and was instructed to come to ED for eval if BP meds were ineffective. Pt has a headache but denies other symptoms. Pt took additional doses of home meds per MD advice but BP has not improved.

## 2022-09-20 ENCOUNTER — Emergency Department (HOSPITAL_COMMUNITY): Payer: PPO

## 2022-09-20 DIAGNOSIS — I509 Heart failure, unspecified: Secondary | ICD-10-CM | POA: Diagnosis not present

## 2022-09-20 DIAGNOSIS — J449 Chronic obstructive pulmonary disease, unspecified: Secondary | ICD-10-CM | POA: Diagnosis not present

## 2022-09-20 LAB — CBC WITH DIFFERENTIAL/PLATELET
Abs Immature Granulocytes: 0.01 10*3/uL (ref 0.00–0.07)
Basophils Absolute: 0 10*3/uL (ref 0.0–0.1)
Basophils Relative: 0 %
Eosinophils Absolute: 0 10*3/uL (ref 0.0–0.5)
Eosinophils Relative: 0 %
HCT: 30.9 % — ABNORMAL LOW (ref 36.0–46.0)
Hemoglobin: 10.2 g/dL — ABNORMAL LOW (ref 12.0–15.0)
Immature Granulocytes: 0 %
Lymphocytes Relative: 35 %
Lymphs Abs: 0.8 10*3/uL (ref 0.7–4.0)
MCH: 29.1 pg (ref 26.0–34.0)
MCHC: 33 g/dL (ref 30.0–36.0)
MCV: 88.3 fL (ref 80.0–100.0)
Monocytes Absolute: 0.3 10*3/uL (ref 0.1–1.0)
Monocytes Relative: 12 %
Neutro Abs: 1.2 10*3/uL — ABNORMAL LOW (ref 1.7–7.7)
Neutrophils Relative %: 53 %
Platelets: 103 10*3/uL — ABNORMAL LOW (ref 150–400)
RBC: 3.5 MIL/uL — ABNORMAL LOW (ref 3.87–5.11)
RDW: 15.6 % — ABNORMAL HIGH (ref 11.5–15.5)
WBC: 2.3 10*3/uL — ABNORMAL LOW (ref 4.0–10.5)
nRBC: 0 % (ref 0.0–0.2)

## 2022-09-20 LAB — BASIC METABOLIC PANEL
Anion gap: 10 (ref 5–15)
BUN: 25 mg/dL — ABNORMAL HIGH (ref 8–23)
CO2: 29 mmol/L (ref 22–32)
Calcium: 9.4 mg/dL (ref 8.9–10.3)
Chloride: 95 mmol/L — ABNORMAL LOW (ref 98–111)
Creatinine, Ser: 1.56 mg/dL — ABNORMAL HIGH (ref 0.44–1.00)
GFR, Estimated: 32 mL/min — ABNORMAL LOW (ref >60)
Glucose, Bld: 108 mg/dL — ABNORMAL HIGH (ref 70–99)
Potassium: 4 mmol/L (ref 3.5–5.1)
Sodium: 134 mmol/L — ABNORMAL LOW (ref 135–145)

## 2022-09-20 LAB — TROPONIN I (HIGH SENSITIVITY): Troponin I (High Sensitivity): 7 ng/L (ref <18)

## 2022-09-20 MED ORDER — HYDRALAZINE HCL 20 MG/ML IJ SOLN
20.0000 mg | Freq: Once | INTRAMUSCULAR | Status: AC
  Administered 2022-09-20: 20 mg via INTRAVENOUS
  Filled 2022-09-20: qty 1

## 2022-09-20 NOTE — ED Provider Notes (Signed)
[facility] Provider Note   CSN: [id number] Arrival date & time: 09/19/22  1901     History  Chief Complaint  Patient presents with   Hypertension    [name] is a 89 y.o. female.  Reports that she has been having problems with high blood pressure recently.  She has a history of hypertension but blood pressure recently has not been controlled with her regular meds.  Last week her kidney doctor gave her a prescription for clonidine point 1 mg to be used as needed for elevated blood pressures.  Pressures have been staying high, she saw the PA today at the nephrology clinic.  She was told to start taking the clonidine twice daily and come to the ER if it did not help.  Blood pressure has been persistently elevated.  She has a history of dull headache, no other symptoms with the hypertension.        Home Medications Prior to Admission medications   Medication Sig Start Date End Date Taking? Authorizing Provider  acetaminophen (TYLENOL) 325 MG tablet Take 325 mg by mouth daily as needed for moderate pain or headache.    [provider]  atorvastatin (LIPITOR) 20 MG tablet TAKE 1/2 TABLET BY MOUTH EVERY DAY 07/07/22   Ronnie Doss M, DO  bisoprolol (ZEBETA) 5 MG tablet TAKE 1 TABLET (5 MG TOTAL) BY MOUTH DAILY. 08/08/22   [name], DO  cholecalciferol (VITAMIN D) 1000 units tablet Take 1,000 Units by mouth daily.    [provider]  cloNIDine (CATAPRES) 0.1 MG tablet Take 1 tablet (0.1 mg total) by mouth as directed. May take one tablet as needed with systolic blood pressure greater than 170 09/10/22   Minus Breeding, MD  feeding supplement (ENSURE ENLIVE / ENSURE PLUS) LIQD Take 237 mLs by mouth 2 (two) times daily between meals. 08/28/22   Orson Eva, MD  ferrous sulfate 325 (65 FE) MG tablet Take 325 mg by mouth 2 (two) times daily with a meal.    [provider]  flecainide (TAMBOCOR) 50 MG  tablet Take 1 tablet (50 mg total) by mouth 2 (two) times daily. 08/21/21   Minus Breeding, MD  hydrALAZINE (APRESOLINE) 100 MG tablet Take 100 mg by mouth 2 (two) times daily.    [provider]  letrozole [facility]) 2.5 MG tablet TAKE 1 TABLET BY MOUTH EVERY DAY 01/06/22   Derek Jack, MD  levothyroxine (SYNTHROID) 75 MCG tablet TAKE 1 TABLET BY MOUTH EVERY DAY 12/12/21   Ronnie Doss M, DO  losartan (COZAAR) 25 MG tablet Take 25 mg by mouth daily.    [provider]  mirtazapine (REMERON) 7.5 MG tablet TAKE 1 TABLET BY MOUTH EVERYDAY AT BEDTIME 08/13/22   Harvel Quale, MD  Multiple Vitamin (MULTIVITAMIN) tablet Take 1 tablet by mouth daily.    [provider]  OXYGEN Inhale 2 L/min into the lungs as needed.    [provider]  pantoprazole (PROTONIX) 40 MG tablet Take 1 tablet (40 mg total) by mouth 2 (two) times daily before a meal. 09/18/22   Erenest Rasher, PA-C      Allergies    Amlodipine, Lisinopril, Xeroform occlusive gauze strip [bismuth tribromoph-petrolatum], Isosorbide nitrate, Sulfonamide derivatives, and Terazosin    Review of Systems   Review of Systems  Physical Exam Updated Vital Signs BP (!) 166/58   Pulse (!) 48   Temp 98.4 F (36.9 C) (Oral)  Resp 14   Ht '5\' 5"'$  (1.651 m)   Wt 48.5 kg   SpO2 96%   BMI 17.81 kg/m  Physical Exam Vitals and nursing note reviewed.  Constitutional:      General: She is not in acute distress.    Appearance: She is well-developed.  HENT:     Head: Normocephalic and atraumatic.     Mouth/Throat:     Mouth: Mucous membranes are moist.  Eyes:     General: Vision grossly intact. Gaze aligned appropriately.     Extraocular Movements: Extraocular movements intact.     Conjunctiva/sclera: Conjunctivae normal.  Cardiovascular:     Rate and Rhythm: Normal rate and regular rhythm.     Pulses: Normal pulses.     Heart sounds: S1 normal and S2 normal. Murmur heard.      Systolic murmur is present.     No friction rub. No gallop.  Pulmonary:     Effort: Pulmonary effort is normal. No respiratory distress.     Breath sounds: Normal breath sounds.  Abdominal:     General: Bowel sounds are normal.     Palpations: Abdomen is soft.     Tenderness: There is no abdominal tenderness. There is no guarding or rebound.     Hernia: No hernia is present.  Musculoskeletal:        General: No swelling.     Cervical back: Full passive range of motion without pain, normal range of motion and neck supple. No spinous process tenderness or muscular tenderness. Normal range of motion.     Right lower leg: 1+ Pitting Edema present.     Left lower leg: 1+ Pitting Edema present.  Skin:    General: Skin is warm and dry.     Capillary Refill: Capillary refill takes less than 2 seconds.     Findings: No ecchymosis, erythema, rash or wound.  Neurological:     General: No focal deficit present.     Mental Status: She is alert and oriented to person, place, and time.     GCS: GCS eye subscore is 4. GCS verbal subscore is 5. GCS motor subscore is 6.     Cranial Nerves: Cranial nerves 2-12 are intact.     Sensory: Sensation is intact.     Motor: Motor function is intact.     Coordination: Coordination is intact.  Psychiatric:        Attention and Perception: Attention normal.        Mood and Affect: Mood normal.        Speech: Speech normal.        Behavior: Behavior normal.     ED Results / Procedures / Treatments   Labs (all labs ordered are listed, but only abnormal results are displayed) Labs Reviewed  CBC WITH DIFFERENTIAL/PLATELET - Abnormal; Notable for the following components:      Result Value   WBC 2.3 (*)    RBC 3.50 (*)    Hemoglobin 10.2 (*)    HCT 30.9 (*)    RDW 15.6 (*)    Platelets 103 (*)    Neutro Abs 1.2 (*)    All other components within normal limits  BASIC METABOLIC PANEL - Abnormal; Notable for the following components:   Sodium 134 (*)     Chloride 95 (*)    Glucose, Bld 108 (*)    BUN 25 (*)    Creatinine, Ser 1.56 (*)    GFR, Estimated 32 (*)  All other components within normal limits  TROPONIN I (HIGH SENSITIVITY)    EKG EKG Interpretation  Date/Time:  Saturday September 20 2022 01:38:25 EST Ventricular Rate:  52 PR Interval:  222 QRS Duration: 101 QT Interval:  522 QTC Calculation: 486 R Axis:   -31 Text Interpretation: Sinus rhythm Prolonged PR interval Left axis deviation Anteroseptal infarct, age indeterminate ST elevation, consider inferior injury No significant change since last tracing Confirmed by Orpah Greek [phone]) on 09/20/2022 2:18:14 AM  Radiology DG Chest Port 1 View  Result Date: 09/20/2022 CLINICAL DATA:  Heart failure EXAM: PORTABLE CHEST 1 VIEW COMPARISON:  08/26/2022 FINDINGS: Moderate cardiomegaly with chronic diffuse interstitial prominence. No focal airspace consolidation. No pleural effusion or pneumothorax. Hyperinflation. IMPRESSION: Cardiomegaly and COPD.  No overt pulmonary edema. Electronically Signed   By: Ulyses Jarred M.D.   On: 09/20/2022 02:02    Procedures Procedures    Medications Ordered in ED Medications  hydrALAZINE (APRESOLINE) injection 20 mg (20 mg Intravenous Given 09/20/22 0147)    ED Course/ Medical Decision Making/ A&P                             Medical Decision Making Amount and/or Complexity of Data Reviewed External Data Reviewed: ECG and notes. Labs: ordered. Decision-making details documented in ED Course. Radiology: ordered and independent interpretation performed. Decision-making details documented in ED Course. ECG/medicine tests: ordered and independent interpretation performed. Decision-making details documented in ED Course.  Risk Prescription drug management.   Presents to the emergency department for evaluation of elevated blood pressure.  Patient reports that her blood pressures have been rising over the last few weeks.  Her  nephrologist has been making adjustments.  She had clonidine added as needed which did not help.  She is to start taking clonidine twice a day today.  She took her dose today and it did not improve so she came to the ED.  Patient was elevated at arrival.  She reports slight headache but no other symptoms.  Neurologic exam is normal, no concern for intracranial bleeding.  Lab work is at baseline.  No evidence of congestive heart failure.  Patient administered IV hydralazine and has improved.  She is still mildly hypertensive but in a safe range.  She will be discharged, take the clonidine as prescribed through the weekend and follow-up with her doctor Monday morning.        Final Clinical Impression(s) / ED Diagnoses Final diagnoses:  Resistant hypertension    Rx / DC Orders ED Discharge Orders     None         Lamiracle Chaidez, Gwenyth Allegra, MD 09/20/22 [phone]

## 2022-09-20 NOTE — ED Notes (Signed)
Notified EDP of pt BP

## 2022-09-21 ENCOUNTER — Encounter (HOSPITAL_COMMUNITY): Payer: Self-pay

## 2022-09-21 ENCOUNTER — Emergency Department (HOSPITAL_COMMUNITY)
Admission: EM | Admit: 2022-09-21 | Discharge: 2022-09-22 | Disposition: A | Discharge status: Home / Self Care | Payer: PPO | Attending: Student | Admitting: Student

## 2022-09-21 ENCOUNTER — Other Ambulatory Visit: Payer: Self-pay

## 2022-09-21 DIAGNOSIS — R001 Bradycardia, unspecified: Secondary | ICD-10-CM | POA: Diagnosis not present

## 2022-09-21 DIAGNOSIS — J45909 Unspecified asthma, uncomplicated: Secondary | ICD-10-CM | POA: Diagnosis not present

## 2022-09-21 DIAGNOSIS — I130 Hypertensive heart and chronic kidney disease with heart failure and stage 1 through stage 4 chronic kidney disease, or unspecified chronic kidney disease: Secondary | ICD-10-CM | POA: Insufficient documentation

## 2022-09-21 DIAGNOSIS — I5032 Chronic diastolic (congestive) heart failure: Secondary | ICD-10-CM | POA: Insufficient documentation

## 2022-09-21 DIAGNOSIS — N183 Chronic kidney disease, stage 3 unspecified: Secondary | ICD-10-CM | POA: Insufficient documentation

## 2022-09-21 DIAGNOSIS — Z853 Personal history of malignant neoplasm of breast: Secondary | ICD-10-CM | POA: Diagnosis not present

## 2022-09-21 DIAGNOSIS — E039 Hypothyroidism, unspecified: Secondary | ICD-10-CM | POA: Diagnosis not present

## 2022-09-21 DIAGNOSIS — Z85828 Personal history of other malignant neoplasm of skin: Secondary | ICD-10-CM | POA: Diagnosis not present

## 2022-09-21 DIAGNOSIS — Z79899 Other long term (current) drug therapy: Secondary | ICD-10-CM | POA: Diagnosis not present

## 2022-09-21 DIAGNOSIS — I160 Hypertensive urgency: Secondary | ICD-10-CM | POA: Insufficient documentation

## 2022-09-21 LAB — COMPREHENSIVE METABOLIC PANEL
ALT: 13 U/L (ref 0–44)
AST: 19 U/L (ref 15–41)
Albumin: 3.9 g/dL (ref 3.5–5.0)
Alkaline Phosphatase: 53 U/L (ref 38–126)
Anion gap: 9 (ref 5–15)
BUN: 33 mg/dL — ABNORMAL HIGH (ref 8–23)
CO2: 27 mmol/L (ref 22–32)
Calcium: 8.5 mg/dL — ABNORMAL LOW (ref 8.9–10.3)
Chloride: 94 mmol/L — ABNORMAL LOW (ref 98–111)
Creatinine, Ser: 1.54 mg/dL — ABNORMAL HIGH (ref 0.44–1.00)
GFR, Estimated: 33 mL/min — ABNORMAL LOW (ref >60)
Glucose, Bld: 118 mg/dL — ABNORMAL HIGH (ref 70–99)
Potassium: 4.3 mmol/L (ref 3.5–5.1)
Sodium: 130 mmol/L — ABNORMAL LOW (ref 135–145)
Total Bilirubin: 0.7 mg/dL (ref 0.3–1.2)
Total Protein: 6.6 g/dL (ref 6.5–8.1)

## 2022-09-21 LAB — CBC WITH DIFFERENTIAL/PLATELET
Abs Immature Granulocytes: 0.01 10*3/uL (ref 0.00–0.07)
Basophils Absolute: 0 10*3/uL (ref 0.0–0.1)
Basophils Relative: 0 %
Eosinophils Absolute: 0 10*3/uL (ref 0.0–0.5)
Eosinophils Relative: 1 %
HCT: 29.1 % — ABNORMAL LOW (ref 36.0–46.0)
Hemoglobin: 9.8 g/dL — ABNORMAL LOW (ref 12.0–15.0)
Immature Granulocytes: 0 %
Lymphocytes Relative: 25 %
Lymphs Abs: 0.6 10*3/uL — ABNORMAL LOW (ref 0.7–4.0)
MCH: 29.4 pg (ref 26.0–34.0)
MCHC: 33.7 g/dL (ref 30.0–36.0)
MCV: 87.4 fL (ref 80.0–100.0)
Monocytes Absolute: 0.3 10*3/uL (ref 0.1–1.0)
Monocytes Relative: 12 %
Neutro Abs: 1.4 10*3/uL — ABNORMAL LOW (ref 1.7–7.7)
Neutrophils Relative %: 62 %
Platelets: 97 10*3/uL — ABNORMAL LOW (ref 150–400)
RBC: 3.33 MIL/uL — ABNORMAL LOW (ref 3.87–5.11)
RDW: 15.6 % — ABNORMAL HIGH (ref 11.5–15.5)
WBC: 2.3 10*3/uL — ABNORMAL LOW (ref 4.0–10.5)
nRBC: 0 % (ref 0.0–0.2)

## 2022-09-21 LAB — TROPONIN I (HIGH SENSITIVITY)
Troponin I (High Sensitivity): 6 ng/L (ref <18)
Troponin I (High Sensitivity): 6 ng/L (ref <18)

## 2022-09-21 MED ORDER — LIDOCAINE 5 % EX PTCH
2.0000 | MEDICATED_PATCH | CUTANEOUS | Status: DC
  Administered 2022-09-21: 2 via TRANSDERMAL
  Filled 2022-09-21: qty 2

## 2022-09-21 MED ORDER — HYDROXYZINE HCL 25 MG PO TABS
25.0000 mg | ORAL_TABLET | Freq: Once | ORAL | Status: AC
  Administered 2022-09-21: 25 mg via ORAL
  Filled 2022-09-21: qty 1

## 2022-09-21 MED ORDER — CLONIDINE HCL 0.2 MG PO TABS
0.2000 mg | ORAL_TABLET | Freq: Once | ORAL | Status: AC
  Administered 2022-09-21: 0.2 mg via ORAL
  Filled 2022-09-21: qty 1

## 2022-09-21 NOTE — ED Notes (Signed)
Patient drinking 8 oz of water

## 2022-09-21 NOTE — ED Notes (Signed)
Edp to room

## 2022-09-21 NOTE — ED Triage Notes (Signed)
Pt reports she had elevated BP at her kidney doctor on Friday so they added some more BP medicine and told her to watch it and if it stayed elevated to come to the ER.

## 2022-09-22 NOTE — ED Provider Notes (Signed)
[facility] Provider Note  CSN: [id number] Arrival date & time: 09/21/22 1825  Chief Complaint(s) Hypertension  HPI [name] is a 89 y.o. female with PMH renal artery stenosis status post stenting, hypertension, CKD, chronic bradycardia who presents emergency department for evaluation of asymptomatic hypertension.  Patient was recently seen in the emergency department on 09/19/2022 for this complaint and had a negative workup and ultimately discharged home after single dose of hydralazine.  Patient states that at home she has noticed that her systolic blood pressures have been greater than 200 despite taking clonidine and her other home blood pressure medications.  She was reportedly told by her nephrology APP to come to the emergency department anytime her blood pressure is greater than 200 and thus she returns emergency department today.  She states that she feels a "tension" and bilateral shoulders when her blood pressure gets high but denies headache, nausea, vomiting, chest pain, shortness of breath, abdominal pain or any other systemic symptoms today.  Patient arrives hypertensive and bradycardic today which appears to be chronic and identical to her presentation on 09/19/2022.    Past Medical History Past Medical History:  Diagnosis Date   Anemia    Arthritis    Asthma    Atrial fibrillation ([place])    Basal cell carcinoma 02/06/2009   Left ear targus- ([facility])   Basal cell carcinoma 09/28/2008   Right back-(CX35FU)   CKD (chronic kidney disease), stage III (HCC)    Heart murmur    Hyperlipidemia    Hypertension    Hypothyroidism    Nodule of right lung    Right upper lobe   Pneumonia    PONV (postoperative nausea and vomiting)    Renal insufficiency    Chronic   Renal vascular disease    Right renal artery stenosis ([place]) 05/09/2015   Squamous cell carcinoma of skin 09/07/2019   KA-Left shin-txpbx   Vertigo    Patient Active  Problem List   Diagnosis Date Noted   Epigastric burning sensation 09/18/2022   Heart failure due to valvular disease ([place]) 09/07/2022   RSV (respiratory syncytial virus pneumonia) 08/27/2022   Acute on chronic heart failure with preserved ejection fraction ([place]) 08/24/2022   GERD (gastroesophageal reflux disease) 06/17/2022   Elevated d-dimer 06/17/2022   Anemia due to chronic blood loss 10/01/2021   Duodenal ulcer 10/01/2021   Loss of weight    Early satiety    Symptomatic anemia 08/31/2021   Acute-on-chronic kidney injury ([place]) 08/31/2021   Dry cough 08/31/2021   UGI bleed 08/31/2021   Failure to thrive in adult 08/31/2021   Mitral valve insufficiency 08/20/2021   History of right breast cancer    Malnutrition of moderate degree 08/03/2021   Acute respiratory failure with hypoxia ([place]) 08/01/2021   Acute bronchitis 08/01/2021   Decreased appetite 07/25/2021   Protein-calorie malnutrition ([place]) 07/25/2021   Skin tear of right lower leg without complication 82/70/7867   Non-intractable vomiting 04/08/2021   Edema 02/22/2021   Constipation 01/23/2021   Osteoarthritis of knee 12/13/2020   History of arthroscopy of knee 12/13/2020   Pain in joint of left knee 12/13/2020   Atrial tachycardia 07/26/2019   Aortic valve sclerosis 07/26/2019   Aortic stenosis, moderate 12/01/2018   Breast cancer, right ([place]) 04/08/2018   Malignant neoplasm of lower-outer quadrant of right breast of female, estrogen receptor positive ([place]) 02/25/2018   Tear of lateral meniscus of knee 10/27/2017   Vertigo 09/09/2016  Renal vascular disease 10/31/2015   Nodule of right lung 10/31/2015   Asthma without status asthmaticus 10/31/2015   Essential hypertension, malignant    Hypothyroidism 10/15/2015   Pancytopenia ([place]) 10/15/2015   Hyponatremia 10/14/2015   Thrombocytopenia ([place]) 06/16/2015   Right renal artery stenosis ([place]) 05/09/2015   Chronic diastolic heart failure ([place]) 04/03/2014   CKD  (chronic kidney disease) stage 3, GFR 30-59 ml/min (HCC) 04/02/2014   Paroxysmal atrial fibrillation ([place]) 01/25/2014   Normocytic anemia 07/19/2013   Bilateral lower extremity edema 06/30/2013   Hypertension with fluid overload 06/30/2013   Varicose veins of lower extremities with other complications 82/80/0349   ABNORMAL STRESS ELECTROCARDIOGRAM 01/23/2010   Hyperlipidemia 12/18/2009   Essential hypertension 12/18/2009   RENAL INSUFFICIENCY, CHRONIC 12/18/2009   Arthropathy 12/18/2009   Home Medication(s) Prior to Admission medications   Medication Sig Start Date End Date Taking? Authorizing Provider  acetaminophen (TYLENOL) 325 MG tablet Take 325 mg by mouth daily as needed for moderate pain or headache.    [provider]  atorvastatin (LIPITOR) 20 MG tablet TAKE 1/2 TABLET BY MOUTH EVERY DAY 07/07/22   Ronnie Doss M, DO  bisoprolol (ZEBETA) 5 MG tablet TAKE 1 TABLET (5 MG TOTAL) BY MOUTH DAILY. 08/08/22   [name], DO  cholecalciferol (VITAMIN D) 1000 units tablet Take 1,000 Units by mouth daily.    [provider]  cloNIDine (CATAPRES) 0.1 MG tablet Take 1 tablet (0.1 mg total) by mouth as directed. May take one tablet as needed with systolic blood pressure greater than 170 09/10/22   Minus Breeding, MD  feeding supplement (ENSURE ENLIVE / ENSURE PLUS) LIQD Take 237 mLs by mouth 2 (two) times daily between meals. 08/28/22   Orson Eva, MD  ferrous sulfate 325 (65 FE) MG tablet Take 325 mg by mouth 2 (two) times daily with a meal.    [provider]  flecainide (TAMBOCOR) 50 MG tablet Take 1 tablet (50 mg total) by mouth 2 (two) times daily. 08/21/21   Minus Breeding, MD  hydrALAZINE (APRESOLINE) 100 MG tablet Take 100 mg by mouth 2 (two) times daily.    [provider]  letrozole [facility]) 2.5 MG tablet TAKE 1 TABLET BY MOUTH EVERY DAY 01/06/22   Derek Jack, MD  levothyroxine (SYNTHROID) 75 MCG tablet TAKE 1 TABLET BY MOUTH  EVERY DAY 12/12/21   Ronnie Doss M, DO  losartan (COZAAR) 25 MG tablet Take 25 mg by mouth daily.    [provider]  mirtazapine (REMERON) 7.5 MG tablet TAKE 1 TABLET BY MOUTH EVERYDAY AT BEDTIME 08/13/22   Harvel Quale, MD  Multiple Vitamin (MULTIVITAMIN) tablet Take 1 tablet by mouth daily.    [provider]  OXYGEN Inhale 2 L/min into the lungs as needed.    [provider]  pantoprazole (PROTONIX) 40 MG tablet Take 1 tablet (40 mg total) by mouth 2 (two) times daily before a meal. 09/18/22   Erenest Rasher, PA-C  Past Surgical History Past Surgical History:  Procedure Laterality Date   BIOPSY  04/09/2021   Procedure: BIOPSY;  Surgeon: Harvel Quale, MD;  [facility];  Service: Gastroenterology;;   BREAST LUMPECTOMY WITH RADIOACTIVE SEED LOCALIZATION Right 04/08/2018   Procedure: BREAST LUMPECTOMY WITH RADIOACTIVE SEED LOCALIZATION;  Surgeon: Rolm Bookbinder, MD;  Location: [facility];  Service: General;  Laterality: Right;   COLONOSCOPY  2016   diverticulosis, hemorrhoids   ESOPHAGOGASTRODUODENOSCOPY  2016   normal   ESOPHAGOGASTRODUODENOSCOPY (EGD) WITH PROPOFOL N/A 04/09/2021   Procedure: ESOPHAGOGASTRODUODENOSCOPY (EGD) WITH PROPOFOL;  Surgeon: Harvel Quale, MD;  [facility];  Service: Gastroenterology;  Laterality: N/A;  12:45   ESOPHAGOGASTRODUODENOSCOPY (EGD) WITH PROPOFOL N/A 09/01/2021   Procedure: ESOPHAGOGASTRODUODENOSCOPY (EGD) WITH PROPOFOL;  Surgeon: Eloise Harman, DO;  [facility];  Service: Endoscopy;  Laterality: N/A;   IR GENERIC HISTORICAL  08/29/2016   IR US GUIDE VASC ACCESS RIGHT 08/29/2016 [facility]   IR GENERIC HISTORICAL  08/29/2016   IR RENAL BILAT S&I MOD SED 08/29/2016 [facility]   KNEE ARTHROSCOPY Right    2019    THYROIDECTOMY, PARTIAL Right 1981   middle lobe removed 1st, then right lobe removed 7-8 years later (approx. 88)   Family History Family History  Problem Relation Age of Onset   Thyroid disease Mother    Stroke Father    Hypertension Brother    Lung cancer Maternal Uncle    Cancer Maternal Grandmother        Liver   Lung cancer Maternal Uncle    Hypertension Daughter    Hypercholesterolemia Daughter    Hypercholesterolemia Daughter     Social History Social History   Tobacco Use   Smoking status: Never   Smokeless tobacco: Never  Vaping Use   Vaping Use: Never used  Substance Use Topics   Alcohol use: No   Drug use: No   Allergies Amlodipine, Lisinopril, Xeroform occlusive gauze strip [bismuth tribromoph-petrolatum], Isosorbide nitrate, Sulfonamide derivatives, and Terazosin  Review of Systems Review of Systems  Musculoskeletal:  Positive for neck pain.    Physical Exam Vital Signs  I have reviewed the triage vital signs BP (!) 168/53   Pulse (!) 44   Temp 98.4 F (36.9 C) (Oral)   Resp 14   Ht '5\' 5"'$  (1.651 m)   Wt 48.1 kg   SpO2 97%   BMI 17.64 kg/m   Physical Exam Vitals and nursing note reviewed.  Constitutional:      General: She is not in acute distress.    Appearance: She is well-developed.  HENT:     Head: Normocephalic and atraumatic.  Eyes:     Conjunctiva/sclera: Conjunctivae normal.  Cardiovascular:     Rate and Rhythm: Normal rate and regular rhythm.     Heart sounds: No murmur heard. Pulmonary:     Effort: Pulmonary effort is normal. No respiratory distress.     Breath sounds: Normal breath sounds.  Abdominal:     Palpations: Abdomen is soft.     Tenderness: There is no abdominal tenderness.  Musculoskeletal:        General: No swelling.     Cervical back: Neck supple.  Skin:    General: Skin is warm and dry.     Capillary Refill: Capillary refill takes less than 2 seconds.  Neurological:     Mental Status: She is alert.   Psychiatric:        Mood and Affect:  Mood normal.     ED Results and Treatments Labs (all labs ordered are listed, but only abnormal results are displayed) Labs Reviewed  COMPREHENSIVE METABOLIC PANEL - Abnormal; Notable for the following components:      Result Value   Sodium 130 (*)    Chloride 94 (*)    Glucose, Bld 118 (*)    BUN 33 (*)    Creatinine, Ser 1.54 (*)    Calcium 8.5 (*)    GFR, Estimated 33 (*)    All other components within normal limits  CBC WITH DIFFERENTIAL/PLATELET - Abnormal; Notable for the following components:   WBC 2.3 (*)    RBC 3.33 (*)    Hemoglobin 9.8 (*)    HCT 29.1 (*)    RDW 15.6 (*)    Platelets 97 (*)    Neutro Abs 1.4 (*)    Lymphs Abs 0.6 (*)    All other components within normal limits  TROPONIN I (HIGH SENSITIVITY)  TROPONIN I (HIGH SENSITIVITY)                                                                                                                          Radiology No results found.  Pertinent labs & imaging results that were available during my care of the patient were reviewed by me and considered in my medical decision making (see MDM for details).  Medications Ordered in ED Medications  cloNIDine (CATAPRES) tablet 0.2 mg (0.2 mg Oral Given 09/21/22 2123)  hydrOXYzine (ATARAX) tablet 25 mg (25 mg Oral Given 09/21/22 2319)                                                                                                                                     Procedures Procedures  (including critical care time)  Medical Decision Making / ED Course   This patient presents to the ED for concern of hypertension, this involves an extensive number of treatment options, and is a complaint that carries with it a high risk of complications and morbidity.  The differential diagnosis includes hypertensive urgency, hypertensive emergency, renal artery stenosis, hyperthyroidism  MDM: Patient seen emergency room for evaluation of  hypertension.  Physical exam is unremarkable outside of some mild tenderness along bilateral trapezii.  Laboratory evaluation with a leukopenia to 2.3, hemoglobin 9.8 which is near patient's baseline, platelet count 97 also near patient's baseline.  BUN 33 and creatinine 1.45 which is patient's baseline.  Mild hyponatremia 130.  High-sensitivity troponin is normal.  ECG with sinus bradycardia as seen previously.  Patient received a dose of 0.2 mg of clonidine and Atarax for muscle relaxation and on reevaluation her blood pressures have improved.  I had an extensive discussion with the patient about asymptomatic hypertension and we discussed at length that aggressive blood pressure control in the emergency department settings has been shown to lead to worse outcomes and that if the patient has elevated blood pressures in the absence of symptoms this is to be addressed in the outpatient setting with her nephrologist and primary care team.  However, I also discussed that if the patient is to have any symptoms including chest pain, shortness of breath, headache, blurry vision, numbness, tingling, weakness that is an entirely different scenario, or if anything truly worries her, she is absolutely welcome to come to the emergency department for further evaluation.  I do suspect that the patient's hypertension may be secondary to worsening renal artery stenosis and she should have a renal ultrasound performed in the outpatient setting to evaluate stent patency.  At this time, we will not change her blood pressure regimen and this will be deferred to her outpatient providers as the patient's bradycardia does increase the complexity of this task.  On reevaluation, patient does have a bradycardia into the mid 40s, but this is primarily seen when the patient is sleeping.  When she awakens this pulse rate improved to the 50s.  She has had no hypotension or lightheadedness and I do not believe that this is symptomatic  bradycardia.  Patient does not meet inpatient criteria for this time and is safe for discharge with outpatient follow-up.   Additional history obtained: -Additional history obtained from daughter -External records from outside source obtained and reviewed including: Chart review including previous notes, labs, imaging, consultation notes   Lab Tests: -I ordered, reviewed, and interpreted labs.   The pertinent results include:   Labs Reviewed  COMPREHENSIVE METABOLIC PANEL - Abnormal; Notable for the following components:      Result Value   Sodium 130 (*)    Chloride 94 (*)    Glucose, Bld 118 (*)    BUN 33 (*)    Creatinine, Ser 1.54 (*)    Calcium 8.5 (*)    GFR, Estimated 33 (*)    All other components within normal limits  CBC WITH DIFFERENTIAL/PLATELET - Abnormal; Notable for the following components:   WBC 2.3 (*)    RBC 3.33 (*)    Hemoglobin 9.8 (*)    HCT 29.1 (*)    RDW 15.6 (*)    Platelets 97 (*)    Neutro Abs 1.4 (*)    Lymphs Abs 0.6 (*)    All other components within normal limits  TROPONIN I (HIGH SENSITIVITY)  TROPONIN I (HIGH SENSITIVITY)      EKG   EKG Interpretation  Date/Time:  Sunday September 21 2022 20:23:34 EST Ventricular Rate:  49 PR Interval:  222 QRS Duration: 103 QT Interval:  552 QTC Calculation: 499 R Axis:   23 Text Interpretation: Sinus bradycardia Borderline prolonged PR interval No significant change since last tracing Confirmed by Suhaila Troiano ([id number]) on 09/22/2022 12:28:00 PM         Medicines ordered and prescription drug management: Meds ordered this encounter  Medications   cloNIDine (CATAPRES) tablet 0.2 mg   DISCONTD: lidocaine (LIDODERM) 5 % 2 patch  hydrOXYzine (ATARAX) tablet 25 mg    -I have reviewed the patients home medicines and have made adjustments as needed  Critical interventions none   Cardiac Monitoring: The patient was maintained on a cardiac monitor.  I personally viewed and interpreted the  cardiac monitored which showed an underlying rhythm of: Sinus bradycardia  Social Determinants of Health:  Factors impacting patients care include: none   Reevaluation: After the interventions noted above, I reevaluated the patient and found that they have :improved  Co morbidities that complicate the patient evaluation  Past Medical History:  Diagnosis Date   Anemia    Arthritis    Asthma    Atrial fibrillation ([place])    Basal cell carcinoma 02/06/2009   Left ear targus- ([facility])   Basal cell carcinoma 09/28/2008   Right back-(CX35FU)   CKD (chronic kidney disease), stage III ([place])    Heart murmur    Hyperlipidemia    Hypertension    Hypothyroidism    Nodule of right lung    Right upper lobe   Pneumonia    PONV (postoperative nausea and vomiting)    Renal insufficiency    Chronic   Renal vascular disease    Right renal artery stenosis ([place]) 05/09/2015   Squamous cell carcinoma of skin 09/07/2019   KA-Left shin-txpbx   Vertigo       Dispostion: I considered admission for this patient, but at this time she does not meet inpatient criteria for admission she is safe for discharge with outpatient follow-up     Final Clinical Impression(s) / ED Diagnoses Final diagnoses:  Hypertensive urgency  Bradycardia     '@PCDICTATION'$ @    Teressa Lower, MD 09/22/22 [phone]

## 2022-09-23 ENCOUNTER — Other Ambulatory Visit: Payer: Self-pay | Admitting: Nephrology

## 2022-09-23 DIAGNOSIS — I129 Hypertensive chronic kidney disease with stage 1 through stage 4 chronic kidney disease, or unspecified chronic kidney disease: Secondary | ICD-10-CM

## 2022-09-23 DIAGNOSIS — N2889 Other specified disorders of kidney and ureter: Secondary | ICD-10-CM

## 2022-09-25 ENCOUNTER — Other Ambulatory Visit (HOSPITAL_COMMUNITY): Payer: Self-pay | Admitting: Nephrology

## 2022-09-25 ENCOUNTER — Other Ambulatory Visit: Payer: Self-pay | Admitting: Cardiology

## 2022-09-25 DIAGNOSIS — I129 Hypertensive chronic kidney disease with stage 1 through stage 4 chronic kidney disease, or unspecified chronic kidney disease: Secondary | ICD-10-CM | POA: Diagnosis not present

## 2022-09-25 DIAGNOSIS — N2889 Other specified disorders of kidney and ureter: Secondary | ICD-10-CM | POA: Diagnosis not present

## 2022-09-25 DIAGNOSIS — N2581 Secondary hyperparathyroidism of renal origin: Secondary | ICD-10-CM | POA: Diagnosis not present

## 2022-09-25 DIAGNOSIS — E785 Hyperlipidemia, unspecified: Secondary | ICD-10-CM | POA: Diagnosis not present

## 2022-09-25 DIAGNOSIS — N183 Chronic kidney disease, stage 3 unspecified: Secondary | ICD-10-CM | POA: Diagnosis not present

## 2022-09-25 DIAGNOSIS — D649 Anemia, unspecified: Secondary | ICD-10-CM | POA: Diagnosis not present

## 2022-09-26 ENCOUNTER — Ambulatory Visit (HOSPITAL_COMMUNITY)
Admission: RE | Admit: 2022-09-26 | Discharge: 2022-09-26 | Disposition: A | Discharge status: Home / Self Care | Payer: PPO | Source: Ambulatory Visit | Attending: Nephrology | Admitting: Nephrology

## 2022-09-26 DIAGNOSIS — I129 Hypertensive chronic kidney disease with stage 1 through stage 4 chronic kidney disease, or unspecified chronic kidney disease: Secondary | ICD-10-CM | POA: Diagnosis not present

## 2022-09-26 DIAGNOSIS — N189 Chronic kidney disease, unspecified: Secondary | ICD-10-CM | POA: Diagnosis not present

## 2022-09-28 DIAGNOSIS — J9601 Acute respiratory failure with hypoxia: Secondary | ICD-10-CM | POA: Diagnosis not present

## 2022-09-28 DIAGNOSIS — K219 Gastro-esophageal reflux disease without esophagitis: Secondary | ICD-10-CM | POA: Diagnosis not present

## 2022-09-30 ENCOUNTER — Inpatient Hospital Stay: Payer: PPO | Attending: Physician Assistant

## 2022-09-30 DIAGNOSIS — D696 Thrombocytopenia, unspecified: Secondary | ICD-10-CM | POA: Diagnosis not present

## 2022-09-30 DIAGNOSIS — C50511 Malignant neoplasm of lower-outer quadrant of right female breast: Secondary | ICD-10-CM | POA: Diagnosis not present

## 2022-09-30 DIAGNOSIS — Z170 Estrogen receptor positive status [ER+]: Secondary | ICD-10-CM | POA: Diagnosis not present

## 2022-09-30 DIAGNOSIS — Z79811 Long term (current) use of aromatase inhibitors: Secondary | ICD-10-CM | POA: Diagnosis not present

## 2022-09-30 DIAGNOSIS — D72819 Decreased white blood cell count, unspecified: Secondary | ICD-10-CM | POA: Diagnosis not present

## 2022-09-30 DIAGNOSIS — M8580 Other specified disorders of bone density and structure, unspecified site: Secondary | ICD-10-CM | POA: Diagnosis not present

## 2022-09-30 DIAGNOSIS — Z79899 Other long term (current) drug therapy: Secondary | ICD-10-CM | POA: Diagnosis not present

## 2022-09-30 DIAGNOSIS — D500 Iron deficiency anemia secondary to blood loss (chronic): Secondary | ICD-10-CM

## 2022-09-30 LAB — COMPREHENSIVE METABOLIC PANEL
ALT: 16 U/L (ref 0–44)
AST: 21 U/L (ref 15–41)
Albumin: 4.1 g/dL (ref 3.5–5.0)
Alkaline Phosphatase: 57 U/L (ref 38–126)
Anion gap: 10 (ref 5–15)
BUN: 24 mg/dL — ABNORMAL HIGH (ref 8–23)
CO2: 29 mmol/L (ref 22–32)
Calcium: 9 mg/dL (ref 8.9–10.3)
Chloride: 95 mmol/L — ABNORMAL LOW (ref 98–111)
Creatinine, Ser: 1.53 mg/dL — ABNORMAL HIGH (ref 0.44–1.00)
GFR, Estimated: 33 mL/min — ABNORMAL LOW (ref >60)
Glucose, Bld: 101 mg/dL — ABNORMAL HIGH (ref 70–99)
Potassium: 4.2 mmol/L (ref 3.5–5.1)
Sodium: 134 mmol/L — ABNORMAL LOW (ref 135–145)
Total Bilirubin: 0.6 mg/dL (ref 0.3–1.2)
Total Protein: 7.2 g/dL (ref 6.5–8.1)

## 2022-09-30 LAB — CBC WITH DIFFERENTIAL/PLATELET
Abs Immature Granulocytes: 0.01 10*3/uL (ref 0.00–0.07)
Basophils Absolute: 0 10*3/uL (ref 0.0–0.1)
Basophils Relative: 0 %
Eosinophils Absolute: 0 10*3/uL (ref 0.0–0.5)
Eosinophils Relative: 0 %
HCT: 32.8 % — ABNORMAL LOW (ref 36.0–46.0)
Hemoglobin: 10.9 g/dL — ABNORMAL LOW (ref 12.0–15.0)
Immature Granulocytes: 0 %
Lymphocytes Relative: 15 %
Lymphs Abs: 0.5 10*3/uL — ABNORMAL LOW (ref 0.7–4.0)
MCH: 29.5 pg (ref 26.0–34.0)
MCHC: 33.2 g/dL (ref 30.0–36.0)
MCV: 88.6 fL (ref 80.0–100.0)
Monocytes Absolute: 0.3 10*3/uL (ref 0.1–1.0)
Monocytes Relative: 11 %
Neutro Abs: 2.2 10*3/uL (ref 1.7–7.7)
Neutrophils Relative %: 74 %
Platelets: 94 10*3/uL — ABNORMAL LOW (ref 150–400)
RBC: 3.7 MIL/uL — ABNORMAL LOW (ref 3.87–5.11)
RDW: 15.4 % (ref 11.5–15.5)
WBC: 2.9 10*3/uL — ABNORMAL LOW (ref 4.0–10.5)
nRBC: 0 % (ref 0.0–0.2)

## 2022-09-30 LAB — FERRITIN: Ferritin: 366 ng/mL — ABNORMAL HIGH (ref 11–307)

## 2022-09-30 LAB — IRON AND TIBC
Iron: 31 ug/dL (ref 28–170)
Saturation Ratios: 11 % (ref 10.4–31.8)
TIBC: 271 ug/dL (ref 250–450)
UIBC: 240 ug/dL

## 2022-09-30 LAB — VITAMIN B12: Vitamin B-12: 781 pg/mL (ref 180–914)

## 2022-10-01 ENCOUNTER — Telehealth: Payer: Self-pay

## 2022-10-01 LAB — MISC LABCORP TEST (SEND OUT): Labcorp test code: 81950

## 2022-10-01 NOTE — Telephone Encounter (Signed)
     Patient  visit on 2/5  at [facility] you been able to follow up with your primary care physician? Yes   The patient was or was not able to obtain any needed medicine or equipment. Yes   Are there diet recommendations that you are having difficulty following? Na   Patient expresses understanding of discharge instructions and education provided has no other needs at this time.  Yes      [facility] [phone] [address], [place], [place] [address] Phone: [phone] Email: [email or web address]

## 2022-10-03 DIAGNOSIS — K219 Gastro-esophageal reflux disease without esophagitis: Secondary | ICD-10-CM | POA: Diagnosis not present

## 2022-10-03 DIAGNOSIS — I5033 Acute on chronic diastolic (congestive) heart failure: Secondary | ICD-10-CM | POA: Diagnosis not present

## 2022-10-03 DIAGNOSIS — J9601 Acute respiratory failure with hypoxia: Secondary | ICD-10-CM | POA: Diagnosis not present

## 2022-10-03 DIAGNOSIS — J121 Respiratory syncytial virus pneumonia: Secondary | ICD-10-CM | POA: Diagnosis not present

## 2022-10-05 LAB — METHYLMALONIC ACID, SERUM: Methylmalonic Acid, Quantitative: 479 nmol/L — ABNORMAL HIGH (ref 0–378)

## 2022-10-06 ENCOUNTER — Ambulatory Visit (HOSPITAL_COMMUNITY): Payer: PPO

## 2022-10-06 NOTE — Progress Notes (Unsigned)
[name], [place] [id number]   CLINIC:  Medical Oncology/Hematology  PCP:  [name], DO [address] [place] [address] [phone]   REASON FOR VISIT:  Follow-up for normocytic anemia + leukopenia/thrombocytopenia + history of breast cancer  BRIEF ONCOLOGIC HISTORY:   Oncology History Overview Note  Cancer Staging Malignant neoplasm of lower-outer quadrant of right breast of female, estrogen receptor positive ([place]) Staging form: Breast, AJCC 8th Edition - Clinical stage from 02/22/2018: Stage IA (cT1c, cN0, cM0, G1, ER+, PR+, HER2-) - Signed by Truitt Merle, MD on 03/02/2018    Malignant neoplasm of lower-outer quadrant of right breast of female, estrogen receptor positive ([place])  02/12/2018 Mammogram   She had routine screening bilateral mammography on 02/12/2018 at [facility] with results showing: indeterminate irregular mass in the right breast.    02/16/2018 Mammogram   She underwent right diagnostic mammography with tomography and right breast ultrasonography at [facility] on 02/16/2018 showing: 1.1 cm irregular mass at the 8 O'clock position on the right breast   02/22/2018 Pathology Results   Diagnosis 1. Breast, right, needle core biopsy, lateral, 8 o'clock - INVASIVE AND IN SITU DUCTAL CARCINOMA. - SEE COMMENT. 2. Breast, right, needle core biopsy, 11 o'clock - FIBROCYSTIC CHANGES. - USUAL DUCTAL HYPERPLASIA. - THERE IS NO EVIDENCE OF MALIGNANCY.   02/22/2018 Receptors her2   IMMUNOHISTOCHEMICAL AND MORPHOMETRIC ANALYSIS PERFORMED MANUALLY Estrogen Receptor: 100%, POSITIVE, STRONG STAINING INTENSITY Progesterone Receptor: 40%, POSITIVE, MODERATE STAINING INTENSITY Proliferation Marker Ki67: 1% Her2 Negative   02/22/2018 Cancer Staging   Staging form: Breast, AJCC 8th Edition - Clinical stage from 02/22/2018: Stage IA (cT1c, cN0, cM0, G1, ER+, PR+, HER2-) - Signed by Truitt Merle, MD on 03/02/2018    02/25/2018 Initial Diagnosis    Malignant neoplasm of lower-outer quadrant of right breast of female, estrogen receptor positive ([place])     CANCER STAGING: Cancer Staging  Malignant neoplasm of lower-outer quadrant of right breast of female, estrogen receptor positive ([place]) Staging form: Breast, AJCC 8th Edition - Clinical stage from 02/22/2018: Stage IA (cT1c, cN0, cM0, G1, ER+, PR+, HER2-) - Signed by Truitt Merle, MD on 03/02/2018   INTERVAL HISTORY:   Ms. SCARLOTTE [name], a 89 y.o. female, returns for routine follow-up of her breast cancer and pancytopenia. Season was last seen on 03/27/2022 by Dr. Delton Coombes.   At today's visit, she  reports feeling fairly well.  She was hospitalized for RSV pneumonia in January 2024, but she denies any other recent hospitalizations, surgeries, or changes in her  baseline health status.  She denies any symptoms of recurrence such as new lumps, bone pain, chest pain, dyspnea, or abdominal pain.  She has no new headaches, seizures, or focal neurologic deficits.  No B symptoms such as fever, chills, night sweats, unintentional weight loss.  She continues to take letrozole, which she is tolerating well without any hot flashes, mood swings, or abnormal pain.  Regarding her pancytopenia, she reports easy bruising but denies any abnormal bleeding.  She denies any abnormal fatigue or B symptoms.  She had RSV pneumonia in January 2024, but denies any other recent or recurrent infections.  She is taking iron supplement twice daily.   She reports 80% energy and 80% appetite.  She is maintaining stable weight at this time.   ASSESSMENT & PLAN:  1.  History of stage I right-sided breast cancer (2019) -She had a mammogram on 02/16/2018 which showed B RADS category 0  incomplete.  She was referred for a biopsy. - Right breast biopsy on 02/22/2018 consistent with IDC. - Right lumpectomy on 04/08/2018, IDC, 1.1 cm, grade 1, margins negative, ER/PR positive, HER2 negative, Ki-67 1%. - She declined adjuvant  radiation therapy. - Letrozole started on 05/28/2018, which she is tolerating well  - Most recent mammogram (03/21/2022): BI-RADS Category 2, benign.  No evidence of breast malignancy in either breast. - Physical exam negative for any abnormalities in either breast - History/ROS negative for any "red flag" symptoms of recurrent breast cancer - Most recent labs (09/30/2022): LFTs normal.  CBC at baseline.   - PLAN: Annual mammogram due August 2024.  Repeat labs at that time and RTC in about 6 months for physical exam and office visit. - Patient is approaching her 5-year mark for letrozole treatment.  We will obtain BCI prior to her next appointment.  2.  Normocytic anemia: - Anemia from CKD, relative iron deficiency and B12 deficiency. - Anemia from CKD and relative iron deficiency. - History of acute blood loss anemia requiring 2 units PRBC in January 2023. - EGD (09/01/2021) showed mild chronic gastritis and nonbleeding duodenal ulcer - She is taking iron twice daily.  - Most recent labs (09/30/2022): Hgb 10.9/MCV 88.6.  Baseline CKD stage IIIb.  Ferritin 366, iron saturation 11%.  Normal vitamin B12 781, MMA remains elevated but improved at 479. - No bright red blood per rectum or melena - PLAN: Hgb at goal.  Would consider initiation of ESA if Hgb < 10.0 - Continue daily iron tablet ONCE daily  - Recommend starting vitamin B12 500 mcg daily due to elevated methylmalonic acid   - Labs and RTC 6 months  3.  Leukopenia and thrombocytopenia - She has had leukopenia and thrombocytopenia since 2015.  Baseline platelets around 100-150.  Baseline WBC around 2.0-3.0. - CT abdomen/pelvis (01/16/2021): Normal-sized spleen.  Liver without any noted abnormalities.   - Borderline B12 deficiency noted on 12/04/2021 with normal B12 but elevated methylmalonic acid 605 - Rheumatoid factor and ANA negative. - Admits to easy bruising, but denies petechial rash.  No B symptoms, frequent infections, or  lymphadenopathy. - CBC (09/30/2022): WBC 2.9/ALC is 0.5, platelets 94.  She has normal vitamin B12 at 781, but MMA elevated and trending downward at 479. - Differential diagnosis includes possible MDS versus immune mediated leukopenia and thrombocytopenia. - Since her WBC and platelets remain within their baseline range, patient would like to hold off on bone marrow biopsy and "watch and wait" for the time being.  However, if she has any further downtrending of her WBC and platelets, bone marrow biopsy would be recommended. - PLAN: Labs and RTC in 6 months - Recommend restarting vitamin B12 supplement 500 mcg daily due to persistent elevation in MMA.  4.  Osteopenia - She is at risk for breast cancer associated bone loss in the setting of aromatase inhibitors since 2019 - DEXA scan done 03/07/2019 showed T-score -1.0 - DEXA scan from 04/23/2021 via [facility] was consistent with osteopenia (T-score -1.7) - Patient takes vitamin D and calcium supplements at home, with most recent vitamin D normal at 59.8 (09/30/2022) - FRAX from 04/23/2021 showed 10-year probability of hip fracture at 10.3% with 10-year risk of major osteoporotic fracture 17.2% - PLAN: Based on worsening bone density, ongoing aromatase inhibitor use, and most recent FRAX showing >3% risk of hip fracture within the next 10 years, she may benefit from pharmacologic treatment of osteopenia such as Prolia.  We discussed  risks and benefits of Prolia treatment, and she is agreeable to proceed. -- Continue calcium and vitamin D supplements with weight-bearing exercise as tolerated  PLAN SUMMARY: >> We will check BCI testing >> We will start patient on Prolia injections every 6 months (first injection next week) >> Labs in 6 months = CBC/D, CMP, ferritin, iron/TIBC, B12, MMA, LDH, vitamin D >> Mammogram in 6 months >> OFFICE visit in 6 months (same day as Prolia injection and at least 1 week after labs/mammogram)   REVIEW OF SYSTEMS:   Review of  Systems  Constitutional:  Negative for appetite change, chills, diaphoresis, fatigue, fever and unexpected weight change.  HENT:   Negative for lump/mass and nosebleeds.   Eyes:  Negative for eye problems.  Respiratory:  Negative for cough, hemoptysis and shortness of breath.   Cardiovascular:  Negative for chest pain, leg swelling and palpitations.  Gastrointestinal:  Negative for abdominal pain, blood in stool, constipation, diarrhea, nausea and vomiting.  Genitourinary:  Negative for hematuria.   Skin: Negative.   Neurological:  Negative for dizziness, headaches and light-headedness.  Hematological:  Does not bruise/bleed easily.    PHYSICAL EXAM:   Performance status (ECOG): 1 - Symptomatic but completely ambulatory  There were no vitals filed for this visit. Wt Readings from Last 3 Encounters:  09/21/22 106 lb (48.1 kg)  09/19/22 107 lb (48.5 kg)  09/18/22 109 lb 12.8 oz (49.8 kg)   Physical Exam Constitutional:      Appearance: Normal appearance.     Comments: Thin body habitus  HENT:     Head: Normocephalic and atraumatic.     Mouth/Throat:     Mouth: Mucous membranes are moist.  Eyes:     Extraocular Movements: Extraocular movements intact.     Pupils: Pupils are equal, round, and reactive to light.  Cardiovascular:     Rate and Rhythm: Normal rate and regular rhythm.     Pulses: Normal pulses.     Heart sounds: Murmur heard.  Pulmonary:     Effort: Pulmonary effort is normal.     Breath sounds: Decreased air movement present.  Chest:  Breasts:    Right: No swelling, bleeding, inverted nipple, mass, nipple discharge, skin change or tenderness.     Left: No swelling, bleeding, inverted nipple, mass, nipple discharge, skin change or tenderness.     Comments: LOQ lumpectomy scar WNL.  Abdominal:     General: Bowel sounds are normal.     Palpations: Abdomen is soft.     Tenderness: There is no abdominal tenderness.  Musculoskeletal:        General: No swelling.      Right lower leg: No edema.     Left lower leg: No edema.  Lymphadenopathy:     Cervical: No cervical adenopathy.  Skin:    General: Skin is warm and dry.     Findings: Bruising (extremities) present.  Neurological:     General: No focal deficit present.     Mental Status: She is alert and oriented to person, place, and time.  Psychiatric:        Mood and Affect: Mood normal.        Behavior: Behavior normal.     PAST MEDICAL/SURGICAL HISTORY:  Past Medical History:  Diagnosis Date   Anemia    Arthritis    Asthma    Atrial fibrillation ([place])    Basal cell carcinoma 02/06/2009   Left ear targus- ([facility])   Basal cell carcinoma 09/28/2008  Right back-([id number])   CKD (chronic kidney disease), stage III ([facility])    Heart murmur    Hyperlipidemia    Hypertension    Hypothyroidism    Nodule of right lung    Right upper lobe   Pneumonia    PONV (postoperative nausea and vomiting)    Renal insufficiency    Chronic   Renal vascular disease    Right renal artery stenosis ([place]) 05/09/2015   Squamous cell carcinoma of skin 09/07/2019   KA-Left [id number]   Vertigo    Past Surgical History:  Procedure Laterality Date   BIOPSY  04/09/2021   Procedure: BIOPSY;  Surgeon: Harvel Quale, MD;  [facility];  Service: Gastroenterology;;   BREAST LUMPECTOMY WITH RADIOACTIVE SEED LOCALIZATION Right 04/08/2018   Procedure: BREAST LUMPECTOMY WITH RADIOACTIVE SEED LOCALIZATION;  Surgeon: Rolm Bookbinder, MD;  Location: [facility];  Service: General;  Laterality: Right;   COLONOSCOPY  2016   diverticulosis, hemorrhoids   ESOPHAGOGASTRODUODENOSCOPY  2016   normal   ESOPHAGOGASTRODUODENOSCOPY (EGD) WITH PROPOFOL N/A 04/09/2021   Procedure: ESOPHAGOGASTRODUODENOSCOPY (EGD) WITH PROPOFOL;  Surgeon: Harvel Quale, MD;  [facility];  Service: Gastroenterology;  Laterality: N/A;  12:45   ESOPHAGOGASTRODUODENOSCOPY (EGD) WITH PROPOFOL N/A 09/01/2021    Procedure: ESOPHAGOGASTRODUODENOSCOPY (EGD) WITH PROPOFOL;  Surgeon: Eloise Harman, DO;  [facility];  Service: Endoscopy;  Laterality: N/A;   IR GENERIC HISTORICAL  08/29/2016   IR US GUIDE VASC ACCESS RIGHT 08/29/2016 [facility]   IR GENERIC HISTORICAL  08/29/2016   IR RENAL BILAT S&I MOD SED 08/29/2016 [facility]   KNEE ARTHROSCOPY Right    2019   THYROIDECTOMY, PARTIAL Right 1981   middle lobe removed 1st, then right lobe removed 7-8 years later (approx. 1988)    SOCIAL HISTORY:  Social History   Socioeconomic History   Marital status: Widowed    Spouse name: Not on file   Number of children: 2   Years of education: 38   Highest education level: Not on file  Occupational History   Occupation: Retired  Tobacco Use   Smoking status: Never   Smokeless tobacco: Never  Vaping Use   Vaping Use: Never used  Substance and Sexual Activity   Alcohol use: No   Drug use: No   Sexual activity: Not Currently  Other Topics Concern   Not on file  Social History Narrative   Lives alone, but her daughter stays there at night   Ambidextrous (writes with left hand).   No caffeine use.   Social Determinants of Health   Financial Resource Strain: Low Risk  (08/29/2022)   Overall Financial Resource Strain (CARDIA)    Difficulty of Paying Living Expenses: Not hard at all  Food Insecurity: No Food Insecurity (08/29/2022)   Hunger Vital Sign    Worried About Running Out of Food in the Last Year: Never true    Ran Out of Food in the Last Year: Never true  Transportation Needs: No Transportation Needs (08/29/2022)   PRAPARE - Hydrologist (Medical): No    Lack of Transportation (Non-Medical): No  Physical Activity: Inactive (04/26/2021)   Exercise Vital Sign    Days of Exercise per Week: 0 days    Minutes of Exercise per Session: 0 min  Stress: No Stress Concern Present (04/26/2021)   [place]    Feeling of Stress : Not at all  Social Connections: Moderately Isolated (04/26/2021)   Social Connection and Isolation Panel [NHANES]    Frequency of Communication with Friends and Family: More than three times a week    Frequency of Social Gatherings with Friends and Family: More than three times a week    Attends Religious Services: More than 4 times per year    Active Member of [employer] or Organizations: No    Attends Archivist Meetings: Never    Marital Status: Widowed  Intimate Partner Violence: Not At Risk (08/27/2022)   Humiliation, Afraid, Rape, and Kick questionnaire    Fear of Current or Ex-Partner: No    Emotionally Abused: No    Physically Abused: No    Sexually Abused: No    FAMILY HISTORY:  Family History  Problem Relation Age of Onset   Thyroid disease Mother    Stroke Father    Hypertension Brother    Lung cancer Maternal Uncle    Cancer Maternal Grandmother        Liver   Lung cancer Maternal Uncle    Hypertension Daughter    Hypercholesterolemia Daughter    Hypercholesterolemia Daughter     CURRENT MEDICATIONS:  Current Outpatient Medications  Medication Sig Dispense Refill   acetaminophen (TYLENOL) 325 MG tablet Take 325 mg by mouth daily as needed for moderate pain or headache.     atorvastatin (LIPITOR) 20 MG tablet TAKE 1/2 TABLET BY MOUTH EVERY DAY 45 tablet 1   bisoprolol (ZEBETA) 5 MG tablet TAKE 1 TABLET (5 MG TOTAL) BY MOUTH DAILY. 90 tablet 3   cholecalciferol (VITAMIN D) 1000 units tablet Take 1,000 Units by mouth daily.     cloNIDine (CATAPRES) 0.1 MG tablet Take 1 tablet (0.1 mg total) by mouth as directed. May take one tablet as needed with systolic blood pressure greater than 170 30 tablet 1   feeding supplement (ENSURE ENLIVE / ENSURE PLUS) LIQD Take 237 mLs by mouth 2 (two) times daily between meals. 237 mL 12   ferrous sulfate 325 (65 FE) MG tablet Take 325 mg by mouth 2 (two) times daily with a meal.      flecainide (TAMBOCOR) 50 MG tablet TAKE 1 TABLET BY MOUTH TWICE A DAY 180 tablet 3   hydrALAZINE (APRESOLINE) 100 MG tablet Take 100 mg by mouth 2 (two) times daily.     letrozole (FEMARA) 2.5 MG tablet TAKE 1 TABLET BY MOUTH EVERY DAY 90 tablet 4   levothyroxine (SYNTHROID) 75 MCG tablet TAKE 1 TABLET BY MOUTH EVERY DAY 90 tablet 3   losartan (COZAAR) 25 MG tablet Take 25 mg by mouth daily.     mirtazapine (REMERON) 7.5 MG tablet TAKE 1 TABLET BY MOUTH EVERYDAY AT BEDTIME 90 tablet 1   Multiple Vitamin (MULTIVITAMIN) tablet Take 1 tablet by mouth daily.     OXYGEN Inhale 2 L/min into the lungs as needed.     pantoprazole (PROTONIX) 40 MG tablet Take 1 tablet (40 mg total) by mouth 2 (two) times daily before a meal. 60 tablet 3   No current facility-administered medications for this visit.    ALLERGIES:  Allergies  Allergen Reactions   Amlodipine Other (See Comments)    edema   Lisinopril     cough   Xeroform Occlusive Gauze Strip [Bismuth Tribromoph-Petrolatum]    Isosorbide Nitrate Nausea Only   Sulfonamide Derivatives Nausea Only and Rash   Terazosin Hives and Nausea Only    LABORATORY DATA:  I have reviewed the labs as  listed.     Latest Ref Rng & Units 09/30/2022   11:26 AM 09/21/2022    8:29 PM 09/20/2022    1:45 AM  CBC  WBC 4.0 - 10.5 K/uL 2.9  2.3  2.3   Hemoglobin 12.0 - 15.0 g/dL 10.9  9.8  10.2   Hematocrit 36.0 - 46.0 % 32.8  29.1  30.9   Platelets 150 - 400 K/uL 94  97  103       Latest Ref Rng & Units 09/30/2022   11:26 AM 09/21/2022    8:29 PM 09/20/2022    1:45 AM  CMP  Glucose 70 - 99 mg/dL 101  118  108   BUN 8 - 23 mg/dL 24  33  25   Creatinine 0.44 - 1.00 mg/dL 1.53  1.54  1.56   Sodium 135 - 145 mmol/L 134  130  134   Potassium 3.5 - 5.1 mmol/L 4.2  4.3  4.0   Chloride 98 - 111 mmol/L 95  94  95   CO2 22 - 32 mmol/L 29  27  29   $ Calcium 8.9 - 10.3 mg/dL 9.0  8.5  9.4   Total Protein 6.5 - 8.1 g/dL 7.2  6.6    Total Bilirubin 0.3 - 1.2 mg/dL 0.6  0.7     Alkaline Phos 38 - 126 U/L 57  53    AST 15 - 41 U/L 21  19    ALT 0 - 44 U/L 16  13      DIAGNOSTIC IMAGING:  I have independently reviewed the scans and discussed with the patient. US RENAL  Result Date: 09/26/2022 CLINICAL DATA:  Hypertensive renal disease, chronic kidney disease EXAM: RENAL / URINARY TRACT ULTRASOUND COMPLETE COMPARISON:  Abdominal ultrasound complete 08/28/2016 FINDINGS: Right Kidney: Renal measurements: 9.2 x 3.7 x 5.1 cm = volume: 91 mL. Cortical thinning. Increased cortical echogenicity. Peripelvic cyst at upper pole 2.4 x 2.4 x 2.4 cm, simple features; no follow-up imaging recommended. No additional renal mass, hydronephrosis or shadowing calcification. Left Kidney: Renal measurements: 9.5 x 3.9 x 4.2 cm = volume: 81 mL. Mild cortical thinning. Increased cortical echogenicity. No mass, hydronephrosis, or shadowing calcification. Bladder: Appears normal for degree of bladder distention. Ureteral jets visualized Other: N/A IMPRESSION: Medical renal disease changes of both kidneys. No evidence of renal mass or hydronephrosis. Electronically Signed   By: Lavonia Dana M.D.   On: 09/26/2022 10:55   DG Chest Port 1 View  Result Date: 09/20/2022 CLINICAL DATA:  Heart failure EXAM: PORTABLE CHEST 1 VIEW COMPARISON:  08/26/2022 FINDINGS: Moderate cardiomegaly with chronic diffuse interstitial prominence. No focal airspace consolidation. No pleural effusion or pneumothorax. Hyperinflation. IMPRESSION: Cardiomegaly and COPD.  No overt pulmonary edema. Electronically Signed   By: Ulyses Jarred M.D.   On: 09/20/2022 02:02     WRAP UP:  All questions were answered. The patient knows to call the clinic with any problems, questions or concerns.  Medical decision making: Moderate  Time spent on visit: I spent 20 minutes counseling the patient face to face. The total time spent in the appointment was 30 minutes and more than 50% was on counseling.  Harriett Rush, PA-C  10/07/22  3:16 PM

## 2022-10-07 ENCOUNTER — Other Ambulatory Visit: Payer: Self-pay | Admitting: Physician Assistant

## 2022-10-07 ENCOUNTER — Inpatient Hospital Stay (HOSPITAL_BASED_OUTPATIENT_CLINIC_OR_DEPARTMENT_OTHER): Payer: PPO | Admitting: Physician Assistant

## 2022-10-07 ENCOUNTER — Ambulatory Visit (HOSPITAL_COMMUNITY): Payer: PPO

## 2022-10-07 ENCOUNTER — Encounter: Payer: Self-pay | Admitting: Physician Assistant

## 2022-10-07 VITALS — BP 132/45 | HR 60 | Temp 98.1°F | Resp 16 | Wt 106.9 lb

## 2022-10-07 DIAGNOSIS — D696 Thrombocytopenia, unspecified: Secondary | ICD-10-CM | POA: Diagnosis not present

## 2022-10-07 DIAGNOSIS — D649 Anemia, unspecified: Secondary | ICD-10-CM | POA: Diagnosis not present

## 2022-10-07 DIAGNOSIS — C50511 Malignant neoplasm of lower-outer quadrant of right female breast: Secondary | ICD-10-CM

## 2022-10-07 DIAGNOSIS — Z170 Estrogen receptor positive status [ER+]: Secondary | ICD-10-CM

## 2022-10-07 DIAGNOSIS — D72810 Lymphocytopenia: Secondary | ICD-10-CM

## 2022-10-07 DIAGNOSIS — M8580 Other specified disorders of bone density and structure, unspecified site: Secondary | ICD-10-CM

## 2022-10-07 HISTORY — DX: Other specified disorders of bone density and structure, unspecified site: M85.80

## 2022-10-07 MED ORDER — CYANOCOBALAMIN 500 MCG PO TABS: 500.0000 ug | ORAL_TABLET | Freq: Every day | ORAL | Status: DC

## 2022-10-07 NOTE — Patient Instructions (Signed)
[place] at [facility] Discharge Instructions  You were seen today by Tarri Abernethy PA-C for your history of breast cancer, osteopenia, and low blood counts.  HISTORY OF BREAST CANCER: You did not have any evidence of recurrent breast cancer on today's exam.  Continue to take your letrozole daily.  We will check labs and mammogram in 6 months.  OSTEOPENIA: You have some ongoing weakness in your bones, which is at least partially related to the letrozole medication that you are taking for your breast cancer.  We will start you on Prolia injections once every 6 months which will help to improve your bone density.  We will check another bone density scan after your next follow-up visit.  LOW BLOOD COUNTS:  Your low hemoglobin/red blood cells may be related to your chronic kidney disease.  You may also have some underlying bone marrow dysfunction, such as a condition called MDS that can only be proven with bone marrow biopsy.  We will continue to watch your labs trend at future appointments, but if you have any worsening of your blood counts, it would be reasonable to check a bone marrow biopsy in the future.  IRON: Your iron level is elevated.  Instead of taking iron twice daily, you should cut back on your iron to taking it only ONCE daily.  VITAMIN B12: Your vitamin B12 level is slightly low (as evidenced by elevated methylmalonic acid).  You should start taking over-the-counter vitamin B12 supplement 500 mcg daily.  FOLLOW-UP APPOINTMENT: Office visit in 6 months with Tarri Abernethy PA-C   ** Thank you for trusting me with your healthcare!  I strive to provide all of my patients with quality care at each visit.  If you receive a survey for this visit, I would be so grateful to you for taking the time to provide feedback.  Thank you in advance!  ~ Shelie Lansing                   Dr. Derek Jack   &   Tarri Abernethy, PA-C   - - - - - - - - - - - - - - - -  - -    Thank you for choosing [place] at [facility] to provide your oncology and hematology care.  To afford each patient quality time with our provider, please arrive at least 15 minutes before your scheduled appointment time.   If you have a lab appointment with the [facility] please come in thru the Main Entrance and check in at the main information desk.  You need to re-schedule your appointment should you arrive 10 or more minutes late.  We strive to give you quality time with our providers, and arriving late affects you and other patients whose appointments are after yours.  Also, if you no show three or more times for appointments you may be dismissed from the clinic at the providers discretion.     Again, thank you for choosing [facility].  Our hope is that these requests will decrease the amount of time that you wait before being seen by our physicians.       _____________________________________________________________  Should you have questions after your visit to [facility], please contact our office at [phone] and follow the prompts.  Our office hours are 8:00 a.m. and 4:30 p.m. Monday - Friday.  Please note that voicemails left after 4:00 p.m. may not  be returned until the following business day.  We are closed weekends and major holidays.  You do have access to a nurse 24-7, just call the main number to the clinic [phone] and do not press any options, hold on the line and a nurse will answer the phone.    For prescription refill requests, have your pharmacy contact our office and allow 72 hours.    Due to Covid, you will need to wear a mask upon entering the hospital. If you do not have a mask, a mask will be given to you at the Main Entrance upon arrival. For doctor visits, patients may have 1 support person age 93 or older with them. For treatment visits, patients can not have anyone with them due to social  distancing guidelines and our immunocompromised population.

## 2022-10-08 ENCOUNTER — Other Ambulatory Visit (INDEPENDENT_AMBULATORY_CARE_PROVIDER_SITE_OTHER): Payer: Self-pay | Admitting: Gastroenterology

## 2022-10-08 ENCOUNTER — Other Ambulatory Visit: Payer: Self-pay

## 2022-10-08 DIAGNOSIS — R1013 Epigastric pain: Secondary | ICD-10-CM

## 2022-10-08 DIAGNOSIS — D72810 Lymphocytopenia: Secondary | ICD-10-CM

## 2022-10-08 DIAGNOSIS — D696 Thrombocytopenia, unspecified: Secondary | ICD-10-CM

## 2022-10-08 DIAGNOSIS — D649 Anemia, unspecified: Secondary | ICD-10-CM

## 2022-10-08 DIAGNOSIS — D500 Iron deficiency anemia secondary to blood loss (chronic): Secondary | ICD-10-CM

## 2022-10-08 DIAGNOSIS — M8580 Other specified disorders of bone density and structure, unspecified site: Secondary | ICD-10-CM

## 2022-10-09 NOTE — Telephone Encounter (Signed)
On pantoprazole 40 mg bid, script changed on 09/18/2022

## 2022-10-10 ENCOUNTER — Ambulatory Visit
Admission: RE | Admit: 2022-10-10 | Discharge: 2022-10-10 | Disposition: A | Discharge status: Home / Self Care | Payer: PPO | Source: Ambulatory Visit | Attending: Nephrology | Admitting: Nephrology

## 2022-10-10 ENCOUNTER — Other Ambulatory Visit: Payer: Self-pay | Admitting: Nephrology

## 2022-10-10 DIAGNOSIS — N2889 Other specified disorders of kidney and ureter: Secondary | ICD-10-CM

## 2022-10-10 DIAGNOSIS — I129 Hypertensive chronic kidney disease with stage 1 through stage 4 chronic kidney disease, or unspecified chronic kidney disease: Secondary | ICD-10-CM

## 2022-10-10 DIAGNOSIS — I10 Essential (primary) hypertension: Secondary | ICD-10-CM | POA: Diagnosis not present

## 2022-10-14 ENCOUNTER — Inpatient Hospital Stay: Payer: PPO

## 2022-10-14 VITALS — BP 168/52 | HR 66 | Temp 96.8°F | Resp 16

## 2022-10-14 DIAGNOSIS — C50511 Malignant neoplasm of lower-outer quadrant of right female breast: Secondary | ICD-10-CM | POA: Diagnosis not present

## 2022-10-14 DIAGNOSIS — M8580 Other specified disorders of bone density and structure, unspecified site: Secondary | ICD-10-CM

## 2022-10-14 MED ORDER — DENOSUMAB 60 MG/ML ~~LOC~~ SOSY
60.0000 mg | PREFILLED_SYRINGE | Freq: Once | SUBCUTANEOUS | Status: AC
  Administered 2022-10-14: 60 mg via SUBCUTANEOUS
  Filled 2022-10-14: qty 1

## 2022-10-14 NOTE — Progress Notes (Signed)
Patient accessed by Tarri Abernethy, PA last week, patient okay to start injection this week. Patient taking calcium as directed. Denied tooth, jaw, and leg pain. No recent or upcoming dental visits. Labs reviewed. Patient tolerated injection with no complaints voiced. See MAR for details. Patient stable during and after injection. Site clean and dry with no bruising or swelling noted. Band aid applied. Vss with discharge and left in satisfactory condition with no s/s of distress.

## 2022-10-14 NOTE — Patient Instructions (Signed)
[facility] [place]  Discharge Instructions: Thank you for choosing [facility] to provide your oncology and hematology care.  If you have a lab appointment with the [facility], please come in thru the Main Entrance and check in at the main information desk.  Wear comfortable clothing and clothing appropriate for easy access to any Portacath or PICC line.   We strive to give you quality time with your provider. You may need to reschedule your appointment if you arrive late (15 or more minutes).  Arriving late affects you and other patients whose appointments are after yours.  Also, if you miss three or more appointments without notifying the office, you may be dismissed from the clinic at the provider's discretion.      For prescription refill requests, have your pharmacy contact our office and allow 72 hours for refills to be completed.    Today you received the following Prolia, return as scheduled.   To help prevent nausea and vomiting after your treatment, we encourage you to take your nausea medication as directed.  BELOW ARE SYMPTOMS THAT SHOULD BE REPORTED IMMEDIATELY: *FEVER GREATER THAN 100.4 F (38 C) OR HIGHER *CHILLS OR SWEATING *NAUSEA AND VOMITING THAT IS NOT CONTROLLED WITH YOUR NAUSEA MEDICATION *UNUSUAL SHORTNESS OF BREATH *UNUSUAL BRUISING OR BLEEDING *URINARY PROBLEMS (pain or burning when urinating, or frequent urination) *BOWEL PROBLEMS (unusual diarrhea, constipation, pain near the anus) TENDERNESS IN MOUTH AND THROAT WITH OR WITHOUT PRESENCE OF ULCERS (sore throat, sores in mouth, or a toothache) UNUSUAL RASH, SWELLING OR PAIN  UNUSUAL VAGINAL DISCHARGE OR ITCHING   Items with * indicate a potential emergency and should be followed up as soon as possible or go to the Emergency Department if any problems should occur.  Please show the CHEMOTHERAPY ALERT CARD or IMMUNOTHERAPY ALERT CARD at check-in to the Emergency Department and triage  nurse.  Should you have questions after your visit or need to cancel or reschedule your appointment, please contact [facility] [phone]  and follow the prompts.  Office hours are 8:00 a.m. to 4:30 p.m. Monday - Friday. Please note that voicemails left after 4:00 p.m. may not be returned until the following business day.  We are closed weekends and major holidays. You have access to a nurse at all times for urgent questions. Please call the main number to the clinic [phone] and follow the prompts.  For any non-urgent questions, you may also contact your provider using MyChart. We now offer e-Visits for anyone 74 and older to request care online for non-urgent symptoms. For details visit [email or web address].   Also download the MyChart app! Go to the app store, search "MyChart", open the app, select [facility], and log in with your MyChart username and password.

## 2022-10-16 DIAGNOSIS — N183 Chronic kidney disease, stage 3 unspecified: Secondary | ICD-10-CM | POA: Diagnosis not present

## 2022-10-16 DIAGNOSIS — I129 Hypertensive chronic kidney disease with stage 1 through stage 4 chronic kidney disease, or unspecified chronic kidney disease: Secondary | ICD-10-CM | POA: Diagnosis not present

## 2022-10-16 DIAGNOSIS — E785 Hyperlipidemia, unspecified: Secondary | ICD-10-CM | POA: Diagnosis not present

## 2022-10-16 DIAGNOSIS — N2581 Secondary hyperparathyroidism of renal origin: Secondary | ICD-10-CM | POA: Diagnosis not present

## 2022-10-16 DIAGNOSIS — D649 Anemia, unspecified: Secondary | ICD-10-CM | POA: Diagnosis not present

## 2022-10-16 DIAGNOSIS — N2889 Other specified disorders of kidney and ureter: Secondary | ICD-10-CM | POA: Diagnosis not present

## 2022-10-17 DIAGNOSIS — C50511 Malignant neoplasm of lower-outer quadrant of right female breast: Secondary | ICD-10-CM | POA: Diagnosis not present

## 2022-10-17 DIAGNOSIS — Z170 Estrogen receptor positive status [ER+]: Secondary | ICD-10-CM | POA: Diagnosis not present

## 2022-10-22 ENCOUNTER — Encounter (HOSPITAL_COMMUNITY): Payer: Self-pay

## 2022-10-27 DIAGNOSIS — J9601 Acute respiratory failure with hypoxia: Secondary | ICD-10-CM | POA: Diagnosis not present

## 2022-10-27 DIAGNOSIS — K219 Gastro-esophageal reflux disease without esophagitis: Secondary | ICD-10-CM | POA: Diagnosis not present

## 2022-11-01 DIAGNOSIS — I5033 Acute on chronic diastolic (congestive) heart failure: Secondary | ICD-10-CM | POA: Diagnosis not present

## 2022-11-01 DIAGNOSIS — J9601 Acute respiratory failure with hypoxia: Secondary | ICD-10-CM | POA: Diagnosis not present

## 2022-11-01 DIAGNOSIS — J121 Respiratory syncytial virus pneumonia: Secondary | ICD-10-CM | POA: Diagnosis not present

## 2022-11-01 DIAGNOSIS — K219 Gastro-esophageal reflux disease without esophagitis: Secondary | ICD-10-CM | POA: Diagnosis not present

## 2022-11-03 ENCOUNTER — Ambulatory Visit (INDEPENDENT_AMBULATORY_CARE_PROVIDER_SITE_OTHER): Payer: PPO | Admitting: Family Medicine

## 2022-11-03 ENCOUNTER — Encounter: Payer: Self-pay | Admitting: Family Medicine

## 2022-11-03 VITALS — BP 138/57 | HR 70 | Temp 98.4°F | Ht 65.0 in | Wt 108.2 lb

## 2022-11-03 DIAGNOSIS — E034 Atrophy of thyroid (acquired): Secondary | ICD-10-CM | POA: Diagnosis not present

## 2022-11-03 DIAGNOSIS — N1832 Chronic kidney disease, stage 3b: Secondary | ICD-10-CM | POA: Diagnosis not present

## 2022-11-03 DIAGNOSIS — R630 Anorexia: Secondary | ICD-10-CM | POA: Diagnosis not present

## 2022-11-03 NOTE — Progress Notes (Signed)
Subjective: CC: Chronic follow-up PCP: [name], DO [id number] [name] is a 89 y.o. female presenting to clinic today for:  1.  Recheck weight, respiratory failure with hypoxia Patient reports that she continues to watch her oxygen level very closely.  She has had no dips below 88% and has not utilize her oxygen at all since her last visit.  She would like me to write an order to have them pick this up for her.  She is getting this through [facility].  She feels like appetite is good and that she is regaining strength.  She has had no falls or other complications since her last visit.  Compliant with the mirtazapine and seems to be doing well with this.  She reports good rest, mood stability.  2.  Hypothyroidism Compliant with Synthroid.  No tremor, heart palpitations.  No changes in bowel habits.  3.  CKD 3B Patient does not report any edema.  No difficulty urinating.   ROS: Per HPI  Allergies  Allergen Reactions   Amlodipine Other (See Comments)    edema   Lisinopril     cough   Xeroform Occlusive Gauze Strip [Bismuth Tribromoph-Petrolatum]    Isosorbide Nitrate Nausea Only   Sulfonamide Derivatives Nausea Only and Rash   Terazosin Hives and Nausea Only   Past Medical History:  Diagnosis Date   Anemia    Arthritis    Asthma    Atrial fibrillation ([place])    Basal cell carcinoma 02/06/2009   Left ear targus- ([facility])   Basal cell carcinoma 09/28/2008   Right back-(CX35FU)   CKD (chronic kidney disease), stage III (HCC)    Heart murmur    Hyperlipidemia    Hypertension    Hypothyroidism    Nodule of right lung    Right upper lobe   Osteopenia due to cancer therapy 10/07/2022   Pneumonia    PONV (postoperative nausea and vomiting)    Renal insufficiency    Chronic   Renal vascular disease    Right renal artery stenosis (HCC) 05/09/2015   Squamous cell carcinoma of skin 09/07/2019   KA-Left shin-txpbx   Vertigo     Current Outpatient Medications:     acetaminophen (TYLENOL) 325 MG tablet, Take 325 mg by mouth daily as needed for moderate pain or headache., Disp: , Rfl:    atorvastatin (LIPITOR) 20 MG tablet, TAKE 1/2 TABLET BY MOUTH EVERY DAY, Disp: 45 tablet, Rfl: 1   bisoprolol (ZEBETA) 5 MG tablet, TAKE 1 TABLET (5 MG TOTAL) BY MOUTH DAILY., Disp: 90 tablet, Rfl: 3   cholecalciferol (VITAMIN D) 1000 units tablet, Take 1,000 Units by mouth daily., Disp: , Rfl:    cloNIDine (CATAPRES) 0.1 MG tablet, Take 1 tablet (0.1 mg total) by mouth as directed. May take one tablet as needed with systolic blood pressure greater than 170, Disp: 30 tablet, Rfl: 1   cyanocobalamin (VITAMIN B12) 500 MCG tablet, Take 1 tablet (500 mcg total) by mouth daily., Disp: , Rfl:    feeding supplement (ENSURE ENLIVE / ENSURE PLUS) LIQD, Take 237 mLs by mouth 2 (two) times daily between meals., Disp: 237 mL, Rfl: 12   ferrous sulfate 325 (65 FE) MG tablet, Take 325 mg by mouth 2 (two) times daily with a meal., Disp: , Rfl:    flecainide (TAMBOCOR) 50 MG tablet, TAKE 1 TABLET BY MOUTH TWICE A DAY, Disp: 180 tablet, Rfl: 3   hydrALAZINE (APRESOLINE) 100 MG tablet, Take 100 mg by mouth  2 (two) times daily., Disp: , Rfl:    letrozole (FEMARA) 2.5 MG tablet, TAKE 1 TABLET BY MOUTH EVERY DAY, Disp: 90 tablet, Rfl: 4   levothyroxine (SYNTHROID) 75 MCG tablet, TAKE 1 TABLET BY MOUTH EVERY DAY, Disp: 90 tablet, Rfl: 3   losartan (COZAAR) 25 MG tablet, Take 25 mg by mouth daily., Disp: , Rfl:    mirtazapine (REMERON) 7.5 MG tablet, TAKE 1 TABLET BY MOUTH EVERYDAY AT BEDTIME, Disp: 90 tablet, Rfl: 1   Multiple Vitamin (MULTIVITAMIN) tablet, Take 1 tablet by mouth daily., Disp: , Rfl:    OXYGEN, Inhale 2 L/min into the lungs as needed., Disp: , Rfl:    pantoprazole (PROTONIX) 40 MG tablet, Take 1 tablet (40 mg total) by mouth 2 (two) times daily before a meal., Disp: 60 tablet, Rfl: 3 Social History   Socioeconomic History   Marital status: Widowed    Spouse name: Not on file    Number of children: 2   Years of education: 22   Highest education level: Not on file  Occupational History   Occupation: Retired  Tobacco Use   Smoking status: Never   Smokeless tobacco: Never  Vaping Use   Vaping Use: Never used  Substance and Sexual Activity   Alcohol use: No   Drug use: No   Sexual activity: Not Currently  Other Topics Concern   Not on file  Social History Narrative   Lives alone, but her daughter stays there at night   Ambidextrous (writes with left hand).   No caffeine use.   Social Determinants of Health   Financial Resource Strain: Low Risk  (08/29/2022)   Overall Financial Resource Strain (CARDIA)    Difficulty of Paying Living Expenses: Not hard at all  Food Insecurity: No Food Insecurity (08/29/2022)   Hunger Vital Sign    Worried About Running Out of Food in the Last Year: Never true    Ran Out of Food in the Last Year: Never true  Transportation Needs: No Transportation Needs (08/29/2022)   PRAPARE - Hydrologist (Medical): No    Lack of Transportation (Non-Medical): No  Physical Activity: Inactive (04/26/2021)   Exercise Vital Sign    Days of Exercise per Week: 0 days    Minutes of Exercise per Session: 0 min  Stress: No Stress Concern Present (04/26/2021)   [place]    Feeling of Stress : Not at all  Social Connections: Moderately Isolated (04/26/2021)   Social Connection and Isolation Panel [NHANES]    Frequency of Communication with Friends and Family: More than three times a week    Frequency of Social Gatherings with Friends and Family: More than three times a week    Attends Religious Services: More than 4 times per year    Active Member of [employer] or Organizations: No    Attends Archivist Meetings: Never    Marital Status: Widowed  Intimate Partner Violence: Not At Risk (08/27/2022)   Humiliation, Afraid, Rape, and Kick questionnaire     Fear of Current or Ex-Partner: No    Emotionally Abused: No    Physically Abused: No    Sexually Abused: No   Family History  Problem Relation Age of Onset   Thyroid disease Mother    Stroke Father    Hypertension Brother    Lung cancer Maternal Uncle    Cancer Maternal Grandmother  Liver   Lung cancer Maternal Uncle    Hypertension Daughter    Hypercholesterolemia Daughter    Hypercholesterolemia Daughter     Objective: Office vital signs reviewed. BP (!) 138/57   Pulse 70   Temp 98.4 F (36.9 C)   Ht 5\' 5"  (1.651 m)   Wt 108 lb 3.2 oz (49.1 kg)   BMI 18.01 kg/m   Physical Examination:  General: Awake, alert, thin, petite female, No acute distress HEENT: sclera white, MMM Cardio: regular rate and rhythm, S1S2 heard, blowing systolic murmur present. Pulm: clear to auscultation bilaterally, no wheezes, rhonchi or rales; normal work of breathing on room air Extremities: warm, well perfused, No edema, cyanosis or clubbing; +2 pulses bilaterally MSK: Ambulating independently.  Assessment/ Plan: 89 y.o. female   Hypothyroidism due to acquired atrophy of thyroid - Plan: TSH, T4, Free  Stage 3b chronic kidney disease ([place]) - Plan: Renal Function Panel, VITAMIN D 25 Hydroxy (Vit-D Deficiency, Fractures)  Poor appetite  Check thyroid levels, renal function, vitamin D given CKD 3B.  Seems to be doing better from an appetite standpoint.  Continue mirtazapine.  She has gained about a pound since her last visit in January.  Written prescription for discontinuation of O2 provided and will be faxed to [facility] at [phone] per her request  No orders of the defined types were placed in this encounter.  No orders of the defined types were placed in this encounter.    [name], DO [facility] [phone]

## 2022-11-04 LAB — RENAL FUNCTION PANEL
Albumin: 4.2 g/dL (ref 3.7–4.7)
BUN/Creatinine Ratio: 14 (ref 12–28)
BUN: 23 mg/dL (ref 8–27)
CO2: 26 mmol/L (ref 20–29)
Calcium: 8.4 mg/dL — ABNORMAL LOW (ref 8.7–10.3)
Chloride: 99 mmol/L (ref 96–106)
Creatinine, Ser: 1.61 mg/dL — ABNORMAL HIGH (ref 0.57–1.00)
Glucose: 124 mg/dL — ABNORMAL HIGH (ref 70–99)
Phosphorus: 3.3 mg/dL (ref 3.0–4.3)
Potassium: 4.1 mmol/L (ref 3.5–5.2)
Sodium: 137 mmol/L (ref 134–144)
eGFR: 31 mL/min/{1.73_m2} — ABNORMAL LOW (ref >59)

## 2022-11-04 LAB — TSH: TSH: 3.07 u[IU]/mL (ref 0.450–4.500)

## 2022-11-04 LAB — T4, FREE: Free T4: 1.74 ng/dL (ref 0.82–1.77)

## 2022-11-04 LAB — VITAMIN D 25 HYDROXY (VIT D DEFICIENCY, FRACTURES): Vit D, 25-Hydroxy: 60.3 ng/mL (ref 30.0–100.0)

## 2022-12-04 ENCOUNTER — Telehealth: Payer: Self-pay | Admitting: Family Medicine

## 2022-12-04 ENCOUNTER — Ambulatory Visit (INDEPENDENT_AMBULATORY_CARE_PROVIDER_SITE_OTHER): Payer: PPO | Admitting: Gastroenterology

## 2022-12-04 NOTE — Telephone Encounter (Signed)
Contacted [name] to schedule their annual wellness visit. Appointment made for 12/15/2022.  Thank you,  Judeth Cornfield,  AMB Clinical Support [facility] AWV Program Direct Dial ??[phone]

## 2022-12-15 ENCOUNTER — Ambulatory Visit (INDEPENDENT_AMBULATORY_CARE_PROVIDER_SITE_OTHER): Payer: PPO

## 2022-12-15 VITALS — Ht 65.0 in | Wt 108.0 lb

## 2022-12-15 DIAGNOSIS — Z780 Asymptomatic menopausal state: Secondary | ICD-10-CM | POA: Diagnosis not present

## 2022-12-15 DIAGNOSIS — Z13820 Encounter for screening for osteoporosis: Secondary | ICD-10-CM

## 2022-12-15 DIAGNOSIS — Z0000 Encounter for general adult medical examination without abnormal findings: Secondary | ICD-10-CM | POA: Diagnosis not present

## 2022-12-15 NOTE — Progress Notes (Signed)
Subjective:   [name] is a 89 y.o. female who presents for Medicare Annual (Subsequent) preventive examination.  I connected with  [name] on 12/15/22 by a audio enabled telemedicine application and verified that I am speaking with the correct person using two identifiers.  Patient Location: Home  Provider Location: Home Office  I discussed the limitations of evaluation and management by telemedicine. The patient expressed understanding and agreed to proceed.  Review of Systems     Cardiac Risk Factors include: advanced age (>2men, >37 women);dyslipidemia;hypertension;sedentary lifestyle     Objective:    Today's Vitals   12/15/22 0937  Weight: 108 lb (49 kg)  Height: 5\' 5"  (1.651 m)   Body mass index is 17.97 kg/m.     12/15/2022    9:41 AM 10/14/2022   10:11 AM 10/07/2022    2:04 PM 09/21/2022    6:32 PM 09/19/2022    7:50 PM 08/27/2022    4:44 PM 08/27/2022    4:38 PM  Advanced Directives  Does Patient Have a Medical Advance Directive? Yes Yes No No Yes Yes Yes  Type of Advance Directive Living will;Healthcare Power of [employer] Power of [place];Living will Healthcare Power of [place];Living will  Healthcare Power of [place];Living will;Out of facility DNR (pink MOST or yellow form) Healthcare Power of [place];Living will Healthcare Power of [place];Living will  Does patient want to make changes to medical advance directive? No - Patient declined No - Patient declined    No - Patient declined   Copy of Healthcare Power of Attorney in Chart? No - copy requested No - copy requested No - copy requested  No - copy requested No - copy requested No - copy requested  Would patient like information on creating a medical advance directive?  No - Patient declined No - Patient declined  No - Patient declined      Current Medications (verified) Outpatient Encounter Medications as of 12/15/2022  Medication Sig   acetaminophen (TYLENOL) 325 MG tablet Take  325 mg by mouth daily as needed for moderate pain or headache.   atorvastatin (LIPITOR) 20 MG tablet TAKE 1/2 TABLET BY MOUTH EVERY DAY   bisoprolol (ZEBETA) 5 MG tablet TAKE 1 TABLET (5 MG TOTAL) BY MOUTH DAILY.   cholecalciferol (VITAMIN D) 1000 units tablet Take 1,000 Units by mouth daily.   cloNIDine (CATAPRES) 0.1 MG tablet Take 1 tablet (0.1 mg total) by mouth as directed. May take one tablet as needed with systolic blood pressure greater than 170   cyanocobalamin (VITAMIN B12) 500 MCG tablet Take 1 tablet (500 mcg total) by mouth daily.   feeding supplement (ENSURE ENLIVE / ENSURE PLUS) LIQD Take 237 mLs by mouth 2 (two) times daily between meals.   ferrous sulfate 325 (65 FE) MG tablet Take 325 mg by mouth 2 (two) times daily with a meal.   flecainide (TAMBOCOR) 50 MG tablet TAKE 1 TABLET BY MOUTH TWICE A DAY   hydrALAZINE (APRESOLINE) 100 MG tablet Take 100 mg by mouth 2 (two) times daily.   letrozole (FEMARA) 2.5 MG tablet TAKE 1 TABLET BY MOUTH EVERY DAY   levothyroxine (SYNTHROID) 75 MCG tablet TAKE 1 TABLET BY MOUTH EVERY DAY   losartan (COZAAR) 25 MG tablet Take 25 mg by mouth daily.   mirtazapine (REMERON) 7.5 MG tablet TAKE 1 TABLET BY MOUTH EVERYDAY AT BEDTIME   Multiple Vitamin (MULTIVITAMIN) tablet Take 1 tablet by mouth daily.   OXYGEN Inhale 2 L/min into the  lungs as needed.   pantoprazole (PROTONIX) 40 MG tablet Take 1 tablet (40 mg total) by mouth 2 (two) times daily before a meal.   No facility-administered encounter medications on file as of 12/15/2022.    Allergies (verified) Amlodipine, Lisinopril, Xeroform occlusive gauze strip [bismuth tribromoph-petrolatum], Isosorbide nitrate, Sulfonamide derivatives, and Terazosin   History: Past Medical History:  Diagnosis Date   Anemia    Arthritis    Asthma    Atrial fibrillation (HCC)    Basal cell carcinoma 02/06/2009   Left ear targus- (MOHS)   Basal cell carcinoma 09/28/2008   Right back-([id number])   CKD (chronic  kidney disease), stage III (HCC)    Heart murmur    Hyperlipidemia    Hypertension    Hypothyroidism    Nodule of right lung    Right upper lobe   Osteopenia due to cancer therapy 10/07/2022   Pneumonia    PONV (postoperative nausea and vomiting)    Renal insufficiency    Chronic   Renal vascular disease    Right renal artery stenosis (HCC) 05/09/2015   Squamous cell carcinoma of skin 09/07/2019   KA-Left shin-txpbx   Vertigo    Past Surgical History:  Procedure Laterality Date   BIOPSY  04/09/2021   Procedure: BIOPSY;  Surgeon: Dolores Frame, MD;  Location: [facility];  Service: Gastroenterology;;   BREAST LUMPECTOMY WITH RADIOACTIVE SEED LOCALIZATION Right 04/08/2018   Procedure: BREAST LUMPECTOMY WITH RADIOACTIVE SEED LOCALIZATION;  Surgeon: Emelia Loron, MD;  Location[facility] OR;  Service: General;  Laterality: Right;   COLONOSCOPY  2016   diverticulosis, hemorrhoids   ESOPHAGOGASTRODUODENOSCOPY  2016   normal   ESOPHAGOGASTRODUODENOSCOPY (EGD) WITH PROPOFOL N/A 04/09/2021   Procedure: ESOPHAGOGASTRODUODENOSCOPY (EGD) WITH PROPOFOL;  Surgeon: Dolores Frame, MD;  Location: [facility];  Service: Gastroenterology;  Laterality: N/A;  12:45   ESOPHAGOGASTRODUODENOSCOPY (EGD) WITH PROPOFOL N/A 09/01/2021   Procedure: ESOPHAGOGASTRODUODENOSCOPY (EGD) WITH PROPOFOL;  Surgeon: Lanelle Bal, DO;  Location: [facility];  Service: Endoscopy;  Laterality: N/A;   IR GENERIC HISTORICAL  08/29/2016   IR US GUIDE VASC ACCESS RIGHT 08/29/2016 [facility]   IR GENERIC HISTORICAL  08/29/2016   IR RENAL BILAT S&I MOD SED 08/29/2016 [facility]   KNEE ARTHROSCOPY Right    2019   THYROIDECTOMY, PARTIAL Right 1981   middle lobe removed 1st, then right lobe removed 7-8 years later (approx. 48)   Family History  Problem Relation Age of Onset   Thyroid disease Mother    Stroke Father    Hypertension Brother    Lung cancer Maternal Uncle    Cancer  Maternal Grandmother        Liver   Lung cancer Maternal Uncle    Hypertension Daughter    Hypercholesterolemia Daughter    Hypercholesterolemia Daughter    Social History   Socioeconomic History   Marital status: Widowed    Spouse name: Not on file   Number of children: 2   Years of education: 32   Highest education level: Not on file  Occupational History   Occupation: Retired  Tobacco Use   Smoking status: Never   Smokeless tobacco: Never  Vaping Use   Vaping Use: Never used  Substance and Sexual Activity   Alcohol use: No   Drug use: No   Sexual activity: Not Currently  Other Topics Concern   Not on file  Social History Narrative   Lives alone, but her daughter stays there at  night   Ambidextrous (writes with left hand).   No caffeine use.   Social Determinants of Health   Financial Resource Strain: Low Risk  (12/15/2022)   Overall Financial Resource Strain (CARDIA)    Difficulty of Paying Living Expenses: Not hard at all  Food Insecurity: No Food Insecurity (12/15/2022)   Hunger Vital Sign    Worried About Running Out of Food in the Last Year: Never true    Ran Out of Food in the Last Year: Never true  Transportation Needs: No Transportation Needs (12/15/2022)   PRAPARE - Administrator, Civil Service (Medical): No    Lack of Transportation (Non-Medical): No  Physical Activity: Inactive (12/15/2022)   Exercise Vital Sign    Days of Exercise per Week: 0 days    Minutes of Exercise per Session: 0 min  Stress: No Stress Concern Present (12/15/2022)   [employer] of Occupational Health - Occupational Stress Questionnaire    Feeling of Stress : Not at all  Social Connections: Moderately Isolated (12/15/2022)   Social Connection and Isolation Panel [NHANES]    Frequency of Communication with Friends and Family: More than three times a week    Frequency of Social Gatherings with Friends and Family: Three times a week    Attends Religious Services:  More than 4 times per year    Active Member of Clubs or Organizations: No    Attends Banker Meetings: Never    Marital Status: Widowed    Tobacco Counseling Counseling given: Not Answered   Clinical Intake:  Pre-visit preparation completed: Yes  Pain : No/denies pain  Diabetes: No  How often do you need to have someone help you when you read instructions, pamphlets, or other written materials from your doctor or pharmacy?: 1 - Never  Diabetic?No   Interpreter Needed?: No  Information entered by :: Kandis Fantasia LPN   Activities of Daily Living    12/15/2022    9:41 AM 08/27/2022    4:42 PM  In your present state of health, do you have any difficulty performing the following activities:  Hearing? 0   Vision? 0   Difficulty concentrating or making decisions? 0   Walking or climbing stairs? 0   Dressing or bathing? 0   Doing errands, shopping? 0 0  Preparing Food and eating ? N   Using the Toilet? N   In the past six months, have you accidently leaked urine? N   Do you have problems with loss of bowel control? N   Managing your Medications? N   Managing your Finances? N   Housekeeping or managing your Housekeeping? N     Patient Care Team: [name], DO as PCP - General (Family Medicine) Rollene Rotunda, MD as PCP - Cardiology (Cardiology) Emelia Loron, MD as Consulting Physician (General Surgery) Lonie Peak, MD as Attending Physician (Radiation Oncology) Malachy Mood, MD as Consulting Physician (Hematology) Janalyn Harder, MD (Inactive) as Consulting Physician (Dermatology) Glyn Ade, PA-C as Physician Assistant (Dermatology) Zetta Bills, MD as Consulting Physician (Nephrology) [facility]  Indicate any recent Medical Services you may have received from other than [facility] providers in the past year (date may be approximate).     Assessment:   This is a routine wellness examination for  [name].  Hearing/Vision screen Hearing Screening - Comments:: HOH; bilateral hearing aids   Vision Screening - Comments:: Wears rx glasses - up to date with routine eye exams with Dr. Dione Booze  Dietary issues and exercise activities discussed: Current Exercise Habits: The patient does not participate in regular exercise at present   Goals Addressed             This Visit's Progress    COMPLETED: Patient Stated       04/29/2022 AWV Goal: Fall Prevention  Over the next year, patient will decrease their risk for falls by: Using assistive devices, such as a cane or walker, as needed Identifying fall risks within their home and correcting them by: Removing throw rugs Adding handrails to stairs or ramps Removing clutter and keeping a clear pathway throughout the home Increasing light, especially at night Adding shower handles/bars Raising toilet seat Identifying potential personal risk factors for falls: Medication side effects Incontinence/urgency Vestibular dysfunction Hearing loss Musculoskeletal disorders Neurological disorders Orthostatic hypotension       Remain active and independent        Depression Screen    12/15/2022    9:39 AM 11/03/2022    9:26 AM 09/09/2022   10:27 AM 07/04/2022   10:40 AM 06/13/2022    1:37 PM 06/09/2022   10:30 AM 03/19/2022    8:39 AM  PHQ 2/9 Scores  PHQ - 2 Score 0 0 0 0 0 0 0  PHQ- 9 Score 0 0 0 0 1      Fall Risk    12/15/2022    9:40 AM 11/03/2022    9:26 AM 09/09/2022   10:33 AM 07/04/2022   10:40 AM 06/13/2022    1:36 PM  Fall Risk   Falls in the past year? 0 0 0 0 0  Number falls in past yr: 0 0 0    Injury with Fall? 0 0 0    Risk for fall due to : No Fall Risks No Fall Risks No Fall Risks    Follow up Falls prevention discussed;Education provided;Falls evaluation completed Education provided Education provided      FALL RISK PREVENTION PERTAINING TO THE HOME:  Any stairs in or around the home? No  If so, are  there any without handrails? No  Home free of loose throw rugs in walkways, pet beds, electrical cords, etc? Yes  Adequate lighting in your home to reduce risk of falls? Yes   ASSISTIVE DEVICES UTILIZED TO PREVENT FALLS:  Life alert? No  Use of a cane, walker or w/c? No  Grab bars in the bathroom? Yes  Shower chair or bench in shower? No  Elevated toilet seat or a handicapped toilet? Yes   TIMED UP AND GO:  Was the test performed? No . Telephonic visit   Cognitive Function:        12/15/2022    9:41 AM 04/29/2022    9:05 AM 04/29/2022    9:04 AM 04/26/2021   10:36 AM  6CIT Screen  What Year? 0 points 0 points 0 points 0 points  What month? 0 points 0 points 0 points 0 points  What time? 0 points 0 points 0 points 0 points  Count back from 20 0 points 0 points  0 points  Months in reverse 0 points 0 points  0 points  Repeat phrase 0 points 0 points  2 points  Total Score 0 points 0 points  2 points    Immunizations Immunization History  Administered Date(s) Administered   Fluad Quad(high Dose 65+) 06/13/2019, 06/01/2020, 06/12/2021, 06/09/2022   Influenza, High Dose Seasonal PF 06/01/2017, 06/04/2018   Influenza,inj,Quad PF,6+ Mos 05/31/2013, 05/30/2015, 06/10/2016  Moderna Sars-Covid-2 Vaccination 09/14/2019, 10/12/2019, 06/12/2020   Pneumococcal Conjugate-13 09/07/2013   Pneumococcal Polysaccharide-23 06/10/2016   Td 10/10/2010, 08/03/2017   Tdap 10/10/2010   Zoster Recombinat (Shingrix) 10/17/2020, 02/27/2021   Zoster, Live 05/20/2010    TDAP status: Up to date  Flu Vaccine status: Up to date  Pneumococcal vaccine status: Up to date  Covid-19 vaccine status: Information provided on how to obtain vaccines.   Qualifies for Shingles Vaccine? Yes   Zostavax completed Yes   Shingrix Completed?: Yes  Screening Tests Health Maintenance  Topic Date Due   COVID-19 Vaccine (4 - 2023-24 season) 04/18/2022   INFLUENZA VACCINE  03/19/2023   DEXA SCAN  04/27/2023    Medicare Annual Wellness (AWV)  12/15/2023   MAMMOGRAM  03/21/2024   DTaP/Tdap/Td (4 - Td or Tdap) 08/04/2027   Pneumonia Vaccine 15+ Years old  Completed   Zoster Vaccines- Shingrix  Completed   HPV VACCINES  Aged Out    Health Maintenance  Health Maintenance Due  Topic Date Due   COVID-19 Vaccine (4 - 2023-24 season) 04/18/2022    Colorectal cancer screening: No longer required.   Mammogram status: Completed 03/21/22. Repeat every year (scheduled for 03/24/23)  Bone Density status: Ordered today due after 04/27/23. Pt provided with contact info and advised to call to schedule appt.  Lung Cancer Screening: (Low Dose CT Chest recommended if Age 1-80 years, 30 pack-year currently smoking OR have quit w/in 15years.) does not qualify.   Lung Cancer Screening Referral: n/a  Additional Screening:  Hepatitis C Screening: does not qualify  Vision Screening: Recommended annual ophthalmology exams for early detection of glaucoma and other disorders of the eye. Is the patient up to date with their annual eye exam?  Yes  Who is the provider or what is the name of the office in which the patient attends annual eye exams? Dr. Dione Booze  If pt is not established with a provider, would they like to be referred to a provider to establish care? No .   Dental Screening: Recommended annual dental exams for proper oral hygiene  Community Resource Referral / Chronic Care Management: CRR required this visit?  No   CCM required this visit?  No      Plan:     I have personally reviewed and noted the following in the patient's chart:   Medical and social history Use of alcohol, tobacco or illicit drugs  Current medications and supplements including opioid prescriptions. Patient is not currently taking opioid prescriptions. Functional ability and status Nutritional status Physical activity Advanced directives List of other physicians Hospitalizations, surgeries, and ER visits in previous 12  months Vitals Screenings to include cognitive, depression, and falls Referrals and appointments  In addition, I have reviewed and discussed with patient certain preventive protocols, quality metrics, and best practice recommendations. A written personalized care plan for preventive services as well as general preventive health recommendations were provided to patient.     Durwin Nora, [place]   1/61/0960   Due to this being a virtual visit, the after visit summary with patients personalized plan was offered to patient via mail or my-chart. Patient would like to access on my-chart  Nurse Notes: No concerns

## 2022-12-15 NOTE — Patient Instructions (Signed)
[name] , Thank you for taking time to come for your Medicare Wellness Visit. I appreciate your ongoing commitment to your health goals. Please review the following plan we discussed and let me know if I can assist you in the future.   These are the goals we discussed:  Goals      Exercise 3x per week (30 min per time)     And careful to prevent falls     Remain active and independent        This is a list of the screening recommended for you and due dates:  Health Maintenance  Topic Date Due   COVID-19 Vaccine (4 - 2023-24 season) 04/18/2022   Flu Shot  03/19/2023   DEXA scan (bone density measurement)  04/27/2023   Medicare Annual Wellness Visit  12/15/2023   Mammogram  03/21/2024   DTaP/Tdap/Td vaccine (4 - Td or Tdap) 08/04/2027   Pneumonia Vaccine  Completed   Zoster (Shingles) Vaccine  Completed   HPV Vaccine  Aged Out    Advanced directives: Please bring a copy of your health care power of attorney and living will to the office to be added to your chart at your convenience.   Conditions/risks identified: Aim for 30 minutes of exercise or brisk walking, 6-8 glasses of water, and 5 servings of fruits and vegetables each day.  Next appointment: Follow up in one year for your annual wellness visit    Preventive Care 65 Years and Older, Female Preventive care refers to lifestyle choices and visits with your health care provider that can promote health and wellness. What does preventive care include? A yearly physical exam. This is also called an annual well check. Dental exams once or twice a year. Routine eye exams. Ask your health care provider how often you should have your eyes checked. Personal lifestyle choices, including: Daily care of your teeth and gums. Regular physical activity. Eating a healthy diet. Avoiding tobacco and drug use. Limiting alcohol use. Practicing safe sex. Taking low-dose aspirin every day. Taking vitamin and mineral supplements as  recommended by your health care provider. What happens during an annual well check? The services and screenings done by your health care provider during your annual well check will depend on your age, overall health, lifestyle risk factors, and family history of disease. Counseling  Your health care provider may ask you questions about your: Alcohol use. Tobacco use. Drug use. Emotional well-being. Home and relationship well-being. Sexual activity. Eating habits. History of falls. Memory and ability to understand (cognition). Work and work Astronomer. Reproductive health. Screening  You may have the following tests or measurements: Height, weight, and BMI. Blood pressure. Lipid and cholesterol levels. These may be checked every 5 years, or more frequently if you are over 38 years old. Skin check. Lung cancer screening. You may have this screening every year starting at age 58 if you have a 30-pack-year history of smoking and currently smoke or have quit within the past 15 years. Fecal occult blood test (FOBT) of the stool. You may have this test every year starting at age 71. Flexible sigmoidoscopy or colonoscopy. You may have a sigmoidoscopy every 5 years or a colonoscopy every 10 years starting at age 110. Hepatitis C blood test. Hepatitis B blood test. Sexually transmitted disease (STD) testing. Diabetes screening. This is done by checking your blood sugar (glucose) after you have not eaten for a while (fasting). You may have this done every 1-3 years. Bone density scan.  This is done to screen for osteoporosis. You may have this done starting at age 81. Mammogram. This may be done every 1-2 years. Talk to your health care provider about how often you should have regular mammograms. Talk with your health care provider about your test results, treatment options, and if necessary, the need for more tests. Vaccines  Your health care provider may recommend certain vaccines, such  as: Influenza vaccine. This is recommended every year. Tetanus, diphtheria, and acellular pertussis (Tdap, Td) vaccine. You may need a Td booster every 10 years. Zoster vaccine. You may need this after age 73. Pneumococcal 13-valent conjugate (PCV13) vaccine. One dose is recommended after age 78. Pneumococcal polysaccharide (PPSV23) vaccine. One dose is recommended after age 18. Talk to your health care provider about which screenings and vaccines you need and how often you need them. This information is not intended to replace advice given to you by your health care provider. Make sure you discuss any questions you have with your health care provider. Document Released: 08/31/2015 Document Revised: 04/23/2016 Document Reviewed: 06/05/2015 Elsevier Interactive Patient Education  2017 [place] Prevention in the Home Falls can cause injuries. They can happen to people of all ages. There are many things you can do to make your home safe and to help prevent falls. What can I do on the outside of my home? Regularly fix the edges of walkways and driveways and fix any cracks. Remove anything that might make you trip as you walk through a door, such as a raised step or threshold. Trim any bushes or trees on the path to your home. Use bright outdoor lighting. Clear any walking paths of anything that might make someone trip, such as rocks or tools. Regularly check to see if handrails are loose or broken. Make sure that both sides of any steps have handrails. Any raised decks and porches should have guardrails on the edges. Have any leaves, snow, or ice cleared regularly. Use sand or salt on walking paths during winter. Clean up any spills in your garage right away. This includes oil or grease spills. What can I do in the bathroom? Use night lights. Install grab bars by the toilet and in the tub and shower. Do not use towel bars as grab bars. Use non-skid mats or decals in the tub or  shower. If you need to sit down in the shower, use a plastic, non-slip stool. Keep the floor dry. Clean up any water that spills on the floor as soon as it happens. Remove soap buildup in the tub or shower regularly. Attach bath mats securely with double-sided non-slip rug tape. Do not have throw rugs and other things on the floor that can make you trip. What can I do in the bedroom? Use night lights. Make sure that you have a light by your bed that is easy to reach. Do not use any sheets or blankets that are too big for your bed. They should not hang down onto the floor. Have a firm chair that has side arms. You can use this for support while you get dressed. Do not have throw rugs and other things on the floor that can make you trip. What can I do in the kitchen? Clean up any spills right away. Avoid walking on wet floors. Keep items that you use a lot in easy-to-reach places. If you need to reach something above you, use a strong step stool that has a grab bar. Keep electrical cords  out of the way. Do not use floor polish or wax that makes floors slippery. If you must use wax, use non-skid floor wax. Do not have throw rugs and other things on the floor that can make you trip. What can I do with my stairs? Do not leave any items on the stairs. Make sure that there are handrails on both sides of the stairs and use them. Fix handrails that are broken or loose. Make sure that handrails are as long as the stairways. Check any carpeting to make sure that it is firmly attached to the stairs. Fix any carpet that is loose or worn. Avoid having throw rugs at the top or bottom of the stairs. If you do have throw rugs, attach them to the floor with carpet tape. Make sure that you have a light switch at the top of the stairs and the bottom of the stairs. If you do not have them, ask someone to add them for you. What else can I do to help prevent falls? Wear shoes that: Do not have high heels. Have  rubber bottoms. Are comfortable and fit you well. Are closed at the toe. Do not wear sandals. If you use a stepladder: Make sure that it is fully opened. Do not climb a closed stepladder. Make sure that both sides of the stepladder are locked into place. Ask someone to hold it for you, if possible. Clearly mark and make sure that you can see: Any grab bars or handrails. First and last steps. Where the edge of each step is. Use tools that help you move around (mobility aids) if they are needed. These include: Canes. Walkers. Scooters. Crutches. Turn on the lights when you go into a dark area. Replace any light bulbs as soon as they burn out. Set up your furniture so you have a clear path. Avoid moving your furniture around. If any of your floors are uneven, fix them. If there are any pets around you, be aware of where they are. Review your medicines with your doctor. Some medicines can make you feel dizzy. This can increase your chance of falling. Ask your doctor what other things that you can do to help prevent falls. This information is not intended to replace advice given to you by your health care provider. Make sure you discuss any questions you have with your health care provider. Document Released: 05/31/2009 Document Revised: 01/10/2016 Document Reviewed: 09/08/2014 Elsevier Interactive Patient Education  2017 [employer].

## 2022-12-16 NOTE — Progress Notes (Addendum)
Referring Provider: [name], DO Primary Care Physician:  [name], DO Primary GI Physician: Dr. Levon Hedger  Chief Complaint  Patient presents with   Follow-up    Doing well, appetite has returned.    HPI:   [name] is a 89 y.o. female with past medical history of asthma, A fib, CKD, HLD, HTN, hypothyroidism, breast cancer, normocytic anemia/pancytopenia following with heme/onc, decreased appetite on Remeron, Candida esophagitis s/p treatment with Diflucan x 21 days August 2022, duodenal ulcer in January 2023, presenting today for follow-up of epigastric burning, poor appetite.   Last seen in our office 09/18/2022.  Reported her stomach had not felt right for the last 2 weeks.  Difficult to explain.  Denied nausea, but reported food "turned her off".  Poor appetite.  At times, there was a burning in the epigastric area.  No specific triggers.  Resolved spontaneously.  Reported weight loss, but unable to quantify, and recorded weights in the chart were stable.  Denied NSAIDs.  Bowels moving well with 2 stool softeners daily without overt bleeding.  We discussed proceeding with EGD for further evaluation versus medical management.  Patient preferred to start with medical management.  Plan to increase PPI to twice daily.  If persistent lack of appetite/esophageal burning, would need EGD to reevaluate.  Also recommended adding 1-2 protein shakes daily.  Today: Feeling much better. Appetite is back. Eating well. No esophageal burning. She thought she had a "bug" on Monday as she did not feel well, but by Wednesday, her appetite returned to normal and she is been feeling well since then.  No nausea or vomiting.  Bowels are moving well.  No BRBPR or melena.  No reflux symptoms or dysphagia.   Last Colonoscopy:(2016) diverticulosis, hemorrhoids  Last Endoscopy:09/01/21   - Esophageal plaques were found, suspicious for candidiasis. Biopsied-neg for candidiasis, consistent  with reflux disease - Gastritis. Biopsied-mild chronic gastritis, neg for H pylori - Mucosal variant in the duodenum. - Non-bleeding duodenal ulcer with no stigmata of bleeding. - Mild Schatzki ring.   Past Medical History:  Diagnosis Date   Anemia    Arthritis    Asthma    Atrial fibrillation (HCC)    Basal cell carcinoma 02/06/2009   Left ear targus- ([facility])   Basal cell carcinoma 09/28/2008   Right back-(CX35FU)   CKD (chronic kidney disease), stage III (HCC)    Heart murmur    Hyperlipidemia    Hypertension    Hypothyroidism    Nodule of right lung    Right upper lobe   Osteopenia due to cancer therapy 10/07/2022   Pneumonia    PONV (postoperative nausea and vomiting)    Renal insufficiency    Chronic   Renal vascular disease    Right renal artery stenosis (HCC) 05/09/2015   Squamous cell carcinoma of skin 09/07/2019   KA-Left shin-txpbx   Vertigo     Past Surgical History:  Procedure Laterality Date   BIOPSY  04/09/2021   Procedure: BIOPSY;  Surgeon: Dolores Frame, MD;  Location[facility];  Service: Gastroenterology;;   BREAST LUMPECTOMY WITH RADIOACTIVE SEED LOCALIZATION Right 04/08/2018   Procedure: BREAST LUMPECTOMY WITH RADIOACTIVE SEED LOCALIZATION;  Surgeon: Emelia Loron, MD;  Location: [facility] OR;  Service: General;  Laterality: Right;   COLONOSCOPY  2016   diverticulosis, hemorrhoids   ESOPHAGOGASTRODUODENOSCOPY  2016   normal   ESOPHAGOGASTRODUODENOSCOPY (EGD) WITH PROPOFOL N/A 04/09/2021   Procedure: ESOPHAGOGASTRODUODENOSCOPY (EGD) WITH PROPOFOL;  Surgeon:  Dolores Frame, MD;  Location: [facility];  Service: Gastroenterology;  Laterality: N/A;  12:45   ESOPHAGOGASTRODUODENOSCOPY (EGD) WITH PROPOFOL N/A 09/01/2021   Procedure: ESOPHAGOGASTRODUODENOSCOPY (EGD) WITH PROPOFOL;  Surgeon: Lanelle Bal, DO;  Location: [facility];  Service: Endoscopy;  Laterality: N/A;   IR GENERIC HISTORICAL  08/29/2016   IR US GUIDE VASC  ACCESS RIGHT 08/29/2016 [facility]   IR GENERIC HISTORICAL  08/29/2016   IR RENAL BILAT S&I MOD SED 08/29/2016 [facility]   KNEE ARTHROSCOPY Right    2019   THYROIDECTOMY, PARTIAL Right 1981   middle lobe removed 1st, then right lobe removed 7-8 years later (approx. 1988)    Current Outpatient Medications  Medication Sig Dispense Refill   acetaminophen (TYLENOL) 325 MG tablet Take 325 mg by mouth daily as needed for moderate pain or headache.     atorvastatin (LIPITOR) 20 MG tablet TAKE 1/2 TABLET BY MOUTH EVERY DAY 45 tablet 1   bisoprolol (ZEBETA) 5 MG tablet TAKE 1 TABLET (5 MG TOTAL) BY MOUTH DAILY. 90 tablet 3   cholecalciferol (VITAMIN D) 1000 units tablet Take 1,000 Units by mouth daily.     cloNIDine (CATAPRES) 0.1 MG tablet Take 1 tablet (0.1 mg total) by mouth as directed. May take one tablet as needed with systolic blood pressure greater than 170 30 tablet 1   cyanocobalamin (VITAMIN B12) 500 MCG tablet Take 1 tablet (500 mcg total) by mouth daily.     feeding supplement (ENSURE ENLIVE / ENSURE PLUS) LIQD Take 237 mLs by mouth 2 (two) times daily between meals. 237 mL 12   ferrous sulfate 325 (65 FE) MG tablet Take 325 mg by mouth daily with breakfast.     flecainide (TAMBOCOR) 50 MG tablet TAKE 1 TABLET BY MOUTH TWICE A DAY 180 tablet 3   hydrALAZINE (APRESOLINE) 100 MG tablet Take 100 mg by mouth 2 (two) times daily.     letrozole (FEMARA) 2.5 MG tablet TAKE 1 TABLET BY MOUTH EVERY DAY 90 tablet 4   levothyroxine (SYNTHROID) 75 MCG tablet TAKE 1 TABLET BY MOUTH EVERY DAY 90 tablet 3   losartan (COZAAR) 25 MG tablet Take 25 mg by mouth daily.     mirtazapine (REMERON) 7.5 MG tablet TAKE 1 TABLET BY MOUTH EVERYDAY AT BEDTIME 90 tablet 1   Multiple Vitamin (MULTIVITAMIN) tablet Take 1 tablet by mouth daily.     pantoprazole (PROTONIX) 40 MG tablet Take 1 tablet (40 mg total) by mouth 2 (two) times daily before a meal. 60 tablet 3   No current facility-administered  medications for this visit.    Allergies as of 12/18/2022 - Review Complete 12/18/2022  Allergen Reaction Noted   Amlodipine Other (See Comments) 10/24/2015   Lisinopril  09/30/2021   Xeroform occlusive gauze strip [bismuth tribromoph-petrolatum]  05/30/2021   Isosorbide nitrate Nausea Only 06/19/2014   Sulfonamide derivatives Nausea Only and Rash 12/19/2009   Terazosin Hives and Nausea Only 06/10/2013    Family History  Problem Relation Age of Onset   Thyroid disease Mother    Stroke Father    Hypertension Brother    Lung cancer Maternal Uncle    Cancer Maternal Grandmother        Liver   Lung cancer Maternal Uncle    Hypertension Daughter    Hypercholesterolemia Daughter    Hypercholesterolemia Daughter     Social History   Socioeconomic History   Marital status: Widowed    Spouse name: Not on file  Number of children: 2   Years of education: 12   Highest education level: Not on file  Occupational History   Occupation: Retired  Tobacco Use   Smoking status: Never   Smokeless tobacco: Never  Vaping Use   Vaping Use: Never used  Substance and Sexual Activity   Alcohol use: No   Drug use: No   Sexual activity: Not Currently  Other Topics Concern   Not on file  Social History Narrative   Lives alone, but her daughter stays there at night   Ambidextrous (writes with left hand).   No caffeine use.   Social Determinants of Health   Financial Resource Strain: Low Risk  (12/15/2022)   Overall Financial Resource Strain (CARDIA)    Difficulty of Paying Living Expenses: Not hard at all  Food Insecurity: No Food Insecurity (12/15/2022)   Hunger Vital Sign    Worried About Running Out of Food in the Last Year: Never true    Ran Out of Food in the Last Year: Never true  Transportation Needs: No Transportation Needs (12/15/2022)   PRAPARE - Administrator, Civil Service (Medical): No    Lack of Transportation (Non-Medical): No  Physical Activity: Inactive  (12/15/2022)   Exercise Vital Sign    Days of Exercise per Week: 0 days    Minutes of Exercise per Session: 0 min  Stress: No Stress Concern Present (12/15/2022)   [employer] of Occupational Health - Occupational Stress Questionnaire    Feeling of Stress : Not at all  Social Connections: Moderately Isolated (12/15/2022)   Social Connection and Isolation Panel [NHANES]    Frequency of Communication with Friends and Family: More than three times a week    Frequency of Social Gatherings with Friends and Family: Three times a week    Attends Religious Services: More than 4 times per year    Active Member of Clubs or Organizations: No    Attends Banker Meetings: Never    Marital Status: Widowed    Review of Systems: Gen: Denies fever, chills, cold or flulike symptoms, presyncope, syncope. CV: Denies chest pain, palpitations.  Resp: Denies dyspnea, cough.  GI: See HPI Heme: See HPI  Physical Exam: BP (!) 149/57 (BP Location: Left Arm, Patient Position: Sitting, Cuff Size: Normal)   Pulse 73   Temp 98.2 F (36.8 C) (Oral)   Ht 5' 3.5" (1.613 m)   Wt 110 lb 6.4 oz (50.1 kg)   SpO2 90%   BMI 19.25 kg/m  General:   Alert and oriented. No distress noted. Pleasant and cooperative.  Head:  Normocephalic and atraumatic. Eyes:  Conjuctiva clear without scleral icterus. Heart:  S1, S2 present without murmurs appreciated. Lungs:  Clear to auscultation bilaterally. No wheezes, rales, or rhonchi. No distress.  Abdomen:  +BS, soft, non-tender and non-distended. No rebound or guarding. No HSM or masses noted. Msk:  Symmetrical without gross deformities. Normal posture. Extremities:  Without edema. Neurologic:  Alert and  oriented x4 Psych:  Normal mood and affect.    Assessment:  89 y.o. female with past medical history of asthma, a fib, CKD, HLD, HTN, hypothyroidism, breast cancer, normocytic anemia/pancytopenia following with heme/onc, decreased appetite on Remeron,  Candida esophagitis s/p treatment with Diflucan x 21 days August 2022, duodenal ulcer in January 2023, presenting today for follow-up of decreased appetite and epigastric burning.  Decreased appetite/epigastric burning: Possibly secondary to gastritis.  Resolved after increasing pantoprazole to 40 mg twice daily in February.  Weight is stable/up 1 pound over the last 3 months.  She does report having what she thought was a "bug" on Monday as she did not feel well, but symptoms have resolved and she is now eating normally again.  Recommended continuing pantoprazole 40 mg twice daily for the next 4 weeks, then can try decreasing back to once daily.  Monitor for recurrent symptoms.   Plan:  Continue pantoprazole 40 mg twice daily x 4 weeks, then decrease to once daily. Monitor for recurrent abdominal pain, poor appetite, and let us know if this occurs. Continue drinking 1 Ensure daily. Follow-up in 6 months or sooner if needed.   Janet Memos, PA-C [facility]astroenter[facility] 12/18/2022  I have reviewed the note and agree with the APP's assessment as described in this progress note  Katrinka Blazing, MD Gastroenterology and Hepatology [facility]

## 2022-12-18 ENCOUNTER — Ambulatory Visit (INDEPENDENT_AMBULATORY_CARE_PROVIDER_SITE_OTHER): Payer: PPO | Admitting: Gastroenterology

## 2022-12-18 ENCOUNTER — Encounter: Payer: Self-pay | Admitting: Gastroenterology

## 2022-12-18 VITALS — BP 149/57 | HR 73 | Temp 98.2°F | Ht 63.5 in | Wt 110.4 lb

## 2022-12-18 DIAGNOSIS — R630 Anorexia: Secondary | ICD-10-CM

## 2022-12-18 DIAGNOSIS — R1013 Epigastric pain: Secondary | ICD-10-CM | POA: Diagnosis not present

## 2022-12-18 NOTE — Patient Instructions (Signed)
Continue pantoprazole 40 mg twice daily for the next 4 weeks, then decrease back to once daily.    Monitor for recurrent abdominal pain, poor appetite, let me know if this occurs.  Continue to drink at least 1 Ensure daily.   Will plan to follow-up with you in 6 months.  Do not hesitate to call sooner if you have questions or concerns.  Ermalinda Memos, PA-C [facility] Gastroenterology

## 2022-12-25 ENCOUNTER — Other Ambulatory Visit: Payer: Self-pay | Admitting: Family Medicine

## 2023-01-08 ENCOUNTER — Other Ambulatory Visit: Payer: Self-pay | Admitting: Family Medicine

## 2023-01-08 DIAGNOSIS — S91001D Unspecified open wound, right ankle, subsequent encounter: Secondary | ICD-10-CM | POA: Diagnosis not present

## 2023-01-08 DIAGNOSIS — E034 Atrophy of thyroid (acquired): Secondary | ICD-10-CM

## 2023-01-08 DIAGNOSIS — S81801D Unspecified open wound, right lower leg, subsequent encounter: Secondary | ICD-10-CM | POA: Diagnosis not present

## 2023-01-11 DIAGNOSIS — S91001D Unspecified open wound, right ankle, subsequent encounter: Secondary | ICD-10-CM | POA: Diagnosis not present

## 2023-01-11 DIAGNOSIS — S8012XD Contusion of left lower leg, subsequent encounter: Secondary | ICD-10-CM | POA: Diagnosis not present

## 2023-01-11 DIAGNOSIS — S81801D Unspecified open wound, right lower leg, subsequent encounter: Secondary | ICD-10-CM | POA: Diagnosis not present

## 2023-01-11 DIAGNOSIS — L03115 Cellulitis of right lower limb: Secondary | ICD-10-CM | POA: Diagnosis not present

## 2023-01-13 ENCOUNTER — Encounter: Payer: Self-pay | Admitting: Family Medicine

## 2023-01-13 ENCOUNTER — Ambulatory Visit (INDEPENDENT_AMBULATORY_CARE_PROVIDER_SITE_OTHER): Payer: PPO | Admitting: Family Medicine

## 2023-01-13 VITALS — BP 157/68 | HR 97 | Temp 98.3°F | Ht 63.5 in | Wt 108.2 lb

## 2023-01-13 DIAGNOSIS — L03115 Cellulitis of right lower limb: Secondary | ICD-10-CM

## 2023-01-13 DIAGNOSIS — T148XXA Other injury of unspecified body region, initial encounter: Secondary | ICD-10-CM

## 2023-01-13 NOTE — Progress Notes (Signed)
Subjective:  Patient ID: [name], female    DOB: 10-04-1933, 89 y.o.   MRN: [id number]  Patient Care Team: [name], DO as PCP - General (Family Medicine) Rollene Rotunda, MD as PCP - Cardiology (Cardiology) Lonie Peak, MD as Attending Physician (Radiation Oncology) Malachy Mood, MD as Consulting Physician (Hematology) Suzi Roots as Physician Assistant (Dermatology) Zetta Bills, MD as Consulting Physician (Nephrology) [facility] Darolyn Rua as Physician Assistant (Oncology)   Chief Complaint:  Spot on leg (Right lower leg- patient states a toy was dropped on her leg and made a tare.  A week ago it started swelling. States it started being painful on Saturday. )   HPI: [name] is a 89 y.o. female presenting on 01/13/2023 for Spot on leg (Right lower leg- patient states a toy was dropped on her leg and made a tare.  A week ago it started swelling. States it started being painful on Saturday. )    Pt presents today for evaluation of wound to right shin. States her grandchild dropped a toy on her leg about 5 weeks ago. States the wound has yet to heal. States she had a home visit with a provider from her insurance on 01/11/2023 and was started on Keflex for cellulitis. States she continues to have pain and swelling. Is taking the antibiotics as prescribed. States 3 days ago she scraped the back of her left leg on her bed causing a blood blister. States it is slightly tender. She denies fever, chills, weakness, or confusion.      Relevant past medical, surgical, family, and social history reviewed and updated as indicated.  Allergies and medications reviewed and updated. Data reviewed: Chart in Epic.   Past Medical History:  Diagnosis Date   Anemia    Arthritis    Asthma    Atrial fibrillation (HCC)    Basal cell carcinoma 02/06/2009   Left ear targus- ([facility])   Basal cell carcinoma 09/28/2008   Right  back-([id number])   CKD (chronic kidney disease), stage III (HCC)    Heart murmur    Hyperlipidemia    Hypertension    Hypothyroidism    Nodule of right lung    Right upper lobe   Osteopenia due to cancer therapy 10/07/2022   Pneumonia    PONV (postoperative nausea and vomiting)    Renal insufficiency    Chronic   Renal vascular disease    Right renal artery stenosis (HCC) 05/09/2015   Squamous cell carcinoma of skin 09/07/2019   KA-Left shin-txpbx   Vertigo     Past Surgical History:  Procedure Laterality Date   BIOPSY  04/09/2021   Procedure: BIOPSY;  Surgeon: Dolores Frame, MD;  Location: [facility];  Service: Gastroenterology;;   BREAST LUMPECTOMY WITH RADIOACTIVE SEED LOCALIZATION Right 04/08/2018   Procedure: BREAST LUMPECTOMY WITH RADIOACTIVE SEED LOCALIZATION;  Surgeon: Emelia Loron, MD;  Location: [facility] OR;  Service: General;  Laterality: Right;   COLONOSCOPY  2016   diverticulosis, hemorrhoids   ESOPHAGOGASTRODUODENOSCOPY  2016   normal   ESOPHAGOGASTRODUODENOSCOPY (EGD) WITH PROPOFOL N/A 04/09/2021   Procedure: ESOPHAGOGASTRODUODENOSCOPY (EGD) WITH PROPOFOL;  Surgeon: Dolores Frame, MD;  Location: [facility];  Service: Gastroenterology;  Laterality: N/A;  12:45   ESOPHAGOGASTRODUODENOSCOPY (EGD) WITH PROPOFOL N/A 09/01/2021   Procedure: ESOPHAGOGASTRODUODENOSCOPY (EGD) WITH PROPOFOL;  Surgeon: Lanelle Bal, DO;  Location: [facility];  Service: Endoscopy;  Laterality: N/A;  IR GENERIC HISTORICAL  08/29/2016   IR US GUIDE VASC ACCESS RIGHT 08/29/2016 [facility]   IR GENERIC HISTORICAL  08/29/2016   IR RENAL BILAT S&I MOD SED 08/29/2016 [facility]   KNEE ARTHROSCOPY Right    2019   THYROIDECTOMY, PARTIAL Right 1981   middle lobe removed 1st, then right lobe removed 7-8 years later (approx. 75)    Social History   Socioeconomic History   Marital status: Widowed    Spouse name: Not on file   Number of children: 2   Years  of education: 18   Highest education level: Not on file  Occupational History   Occupation: Retired  Tobacco Use   Smoking status: Never   Smokeless tobacco: Never  Vaping Use   Vaping Use: Never used  Substance and Sexual Activity   Alcohol use: No   Drug use: No   Sexual activity: Not Currently  Other Topics Concern   Not on file  Social History Narrative   Lives alone, but her daughter stays there at night   Ambidextrous (writes with left hand).   No caffeine use.   Social Determinants of Health   Financial Resource Strain: Low Risk  (12/15/2022)   Overall Financial Resource Strain (CARDIA)    Difficulty of Paying Living Expenses: Not hard at all  Food Insecurity: No Food Insecurity (12/15/2022)   Hunger Vital Sign    Worried About Running Out of Food in the Last Year: Never true    Ran Out of Food in the Last Year: Never true  Transportation Needs: No Transportation Needs (12/15/2022)   PRAPARE - Administrator, Civil Service (Medical): No    Lack of Transportation (Non-Medical): No  Physical Activity: Inactive (12/15/2022)   Exercise Vital Sign    Days of Exercise per Week: 0 days    Minutes of Exercise per Session: 0 min  Stress: No Stress Concern Present (12/15/2022)   [employer] of Occupational Health - Occupational Stress Questionnaire    Feeling of Stress : Not at all  Social Connections: Moderately Isolated (12/15/2022)   Social Connection and Isolation Panel [NHANES]    Frequency of Communication with Friends and Family: More than three times a week    Frequency of Social Gatherings with Friends and Family: Three times a week    Attends Religious Services: More than 4 times per year    Active Member of Clubs or Organizations: No    Attends Banker Meetings: Never    Marital Status: Widowed  Intimate Partner Violence: Not At Risk (12/15/2022)   Humiliation, Afraid, Rape, and Kick questionnaire    Fear of Current or Ex-Partner: No     Emotionally Abused: No    Physically Abused: No    Sexually Abused: No    Outpatient Encounter Medications as of 01/13/2023  Medication Sig   acetaminophen (TYLENOL) 325 MG tablet Take 325 mg by mouth daily as needed for moderate pain or headache.   atorvastatin (LIPITOR) 20 MG tablet TAKE 1/2 TABLET BY MOUTH EVERY DAY   bisoprolol (ZEBETA) 5 MG tablet TAKE 1 TABLET (5 MG TOTAL) BY MOUTH DAILY.   cholecalciferol (VITAMIN D) 1000 units tablet Take 1,000 Units by mouth daily.   cloNIDine (CATAPRES) 0.1 MG tablet Take 1 tablet (0.1 mg total) by mouth as directed. May take one tablet as needed with systolic blood pressure greater than 170   cyanocobalamin (VITAMIN B12) 500 MCG tablet Take 1 tablet (500 mcg total) by  mouth daily.   feeding supplement (ENSURE ENLIVE / ENSURE PLUS) LIQD Take 237 mLs by mouth 2 (two) times daily between meals.   ferrous sulfate 325 (65 FE) MG tablet Take 325 mg by mouth daily with breakfast.   flecainide (TAMBOCOR) 50 MG tablet TAKE 1 TABLET BY MOUTH TWICE A DAY   hydrALAZINE (APRESOLINE) 100 MG tablet Take 100 mg by mouth 2 (two) times daily.   letrozole (FEMARA) 2.5 MG tablet TAKE 1 TABLET BY MOUTH EVERY DAY   levothyroxine (SYNTHROID) 75 MCG tablet TAKE 1 TABLET BY MOUTH EVERY DAY   losartan (COZAAR) 25 MG tablet Take 25 mg by mouth daily.   mirtazapine (REMERON) 7.5 MG tablet TAKE 1 TABLET BY MOUTH EVERYDAY AT BEDTIME   Multiple Vitamin (MULTIVITAMIN) tablet Take 1 tablet by mouth daily.   pantoprazole (PROTONIX) 40 MG tablet Take 1 tablet (40 mg total) by mouth 2 (two) times daily before a meal.   No facility-administered encounter medications on file as of 01/13/2023.    Allergies  Allergen Reactions   Amlodipine Other (See Comments)    edema   Lisinopril     cough   Xeroform Occlusive Gauze Strip [Bismuth Tribromoph-Petrolatum]    Isosorbide Nitrate Nausea Only   Sulfonamide Derivatives Nausea And Vomiting and Rash   Terazosin Hives and Nausea Only     Review of Systems  Constitutional:  Negative for activity change, appetite change, chills, diaphoresis, fatigue, fever and unexpected weight change.  Eyes:  Negative for photophobia and visual disturbance.  Respiratory:  Negative for cough, chest tightness and shortness of breath.   Cardiovascular:  Positive for leg swelling. Negative for chest pain and palpitations.  Genitourinary:  Negative for decreased urine volume and difficulty urinating.  Skin:  Positive for color change and wound. Negative for pallor and rash.  Neurological:  Negative for dizziness, tremors, seizures, syncope, facial asymmetry, speech difficulty, weakness, light-headedness, numbness and headaches.  Psychiatric/Behavioral:  Negative for confusion.   All other systems reviewed and are negative.       Objective:  BP (!) 157/68   Pulse 97   Temp 98.3 F (36.8 C) (Temporal)   Ht 5' 3.5" (1.613 m)   Wt 108 lb 3.2 oz (49.1 kg)   SpO2 97%   BMI 18.87 kg/m    Wt Readings from Last 3 Encounters:  01/13/23 108 lb 3.2 oz (49.1 kg)  12/18/22 110 lb 6.4 oz (50.1 kg)  12/15/22 108 lb (49 kg)    Physical Exam Vitals and nursing note reviewed.  Constitutional:      General: She is not in acute distress.    Appearance: She is not ill-appearing, toxic-appearing or diaphoretic.     Comments: Frail elderly  HENT:     Head: Normocephalic and atraumatic.     Mouth/Throat:     Mouth: Mucous membranes are moist.  Eyes:     Pupils: Pupils are equal, round, and reactive to light.  Cardiovascular:     Rate and Rhythm: Normal rate and regular rhythm.  Pulmonary:     Effort: Pulmonary effort is normal.     Breath sounds: Normal breath sounds.  Musculoskeletal:     Cervical back: Neck supple.  Skin:    General: Skin is warm and dry.     Capillary Refill: Capillary refill takes less than 2 seconds.     Findings: Erythema and wound present.       Neurological:     General: No focal deficit present.  Mental  Status: She is alert and oriented to person, place, and time.  Psychiatric:        Mood and Affect: Mood normal.        Behavior: Behavior normal.        Thought Content: Thought content normal.        Judgment: Judgment normal.        Results for orders placed or performed in visit on 11/03/22  TSH  Result Value Ref Range   TSH 3.070 0.450 - 4.500 uIU/mL  T4, Free  Result Value Ref Range   Free T4 1.74 0.82 - 1.77 ng/dL  Renal Function Panel  Result Value Ref Range   Glucose 124 (H) 70 - 99 mg/dL   BUN 23 8 - 27 mg/dL   Creatinine, Ser 4.09 (H) 0.57 - 1.00 mg/dL   eGFR 31 (L) >81 XB/JYN/8.29   BUN/Creatinine Ratio 14 12 - 28   Sodium 137 134 - 144 mmol/L   Potassium 4.1 3.5 - 5.2 mmol/L   Chloride 99 96 - 106 mmol/L   CO2 26 20 - 29 mmol/L   Calcium 8.4 (L) 8.7 - 10.3 mg/dL   Phosphorus 3.3 3.0 - 4.3 mg/dL   Albumin 4.2 3.7 - 4.7 g/dL  VITAMIN D 25 Hydroxy (Vit-D Deficiency, Fractures)  Result Value Ref Range   Vit D, 25-Hydroxy 60.3 30.0 - 100.0 ng/mL     I & D  Date/Time: 01/13/2023 10:17 AM  Performed by: Sonny Masters, FNP Authorized by: Sonny Masters, FNP   Consent:    Consent obtained:  Verbal   Consent given by:  Patient   Risks, benefits, and alternatives were discussed: yes     Risks discussed:  Bleeding, incomplete drainage, pain, infection and damage to other organs   Alternatives discussed:  No treatment, delayed treatment, alternative treatment, observation and referral Universal protocol:    Procedure explained and questions answered to patient or proxy's satisfaction: yes     Immediately prior to procedure a time out was called: yes     Patient identity confirmed:  Verbally with patient Location:    Type:  Abscess   Location:  Lower extremity   Lower extremity location:  Leg   Leg location:  R lower leg Pre-procedure details:    Skin preparation:  Povidone-iodine Sedation:    Sedation type:  None Anesthesia:    Anesthesia method:   None Procedure type:    Complexity:  Simple Procedure details:    Needle aspiration: no     Incision types:  Single straight   Scalpel blade:  11   Drainage:  Purulent   Drainage amount:  Scant   Wound treatment:  Wound left open   Packing materials:  None Post-procedure details:    Procedure completion:  Tolerated well, no immediate complications    Pertinent labs & imaging results that were available during my care of the patient were reviewed by me and considered in my medical decision making.  Assessment & Plan:  Zaidah was seen today for spot on leg.  Diagnoses and all orders for this visit:  Cellulitis of right lower extremity Single stab incision in office to open wound to allow to drain. Wound care discussed in detail. Continue Keflex. Follow up in office in 1 week for reevaluation. Aware of red flags which require emergent evaluation and treatment. If not improving at follow up, will refer to wound care.   Blood blister Symptomatic care discussed in detail. Will reevaluate  at next visit and drain if warranted. Will refer to wound care if not improving.     Continue all other maintenance medications.  Follow up plan: Return in about 1 week (around 01/20/2023) for cellulitis, blood blister .   Continue healthy lifestyle choices, including diet (rich in fruits, vegetables, and lean proteins, and low in salt and simple carbohydrates) and exercise (at least 30 minutes of moderate physical activity daily).  Educational handout given for cellulitis   The above assessment and management plan was discussed with the patient. The patient verbalized understanding of and has agreed to the management plan. Patient is aware to call the clinic if they develop any new symptoms or if symptoms persist or worsen. Patient is aware when to return to the clinic for a follow-up visit. Patient educated on when it is appropriate to go to the emergency department.   Kari Baars,  FNP-C [facility] [phone]

## 2023-01-14 ENCOUNTER — Telehealth: Payer: Self-pay | Admitting: Family Medicine

## 2023-01-14 NOTE — Telephone Encounter (Signed)
Patient granddaughter aware and verbalized understanding.

## 2023-01-15 ENCOUNTER — Other Ambulatory Visit: Payer: Self-pay | Admitting: Gastroenterology

## 2023-01-15 DIAGNOSIS — R1013 Epigastric pain: Secondary | ICD-10-CM

## 2023-01-16 ENCOUNTER — Encounter: Payer: Self-pay | Admitting: Family Medicine

## 2023-01-16 ENCOUNTER — Ambulatory Visit (INDEPENDENT_AMBULATORY_CARE_PROVIDER_SITE_OTHER): Payer: PPO | Admitting: Family Medicine

## 2023-01-16 VITALS — BP 150/54 | HR 62 | Temp 97.7°F | Resp 20 | Ht 63.0 in | Wt 110.0 lb

## 2023-01-16 DIAGNOSIS — L03115 Cellulitis of right lower limb: Secondary | ICD-10-CM

## 2023-01-16 DIAGNOSIS — S8012XA Contusion of left lower leg, initial encounter: Secondary | ICD-10-CM | POA: Diagnosis not present

## 2023-01-16 DIAGNOSIS — L03116 Cellulitis of left lower limb: Secondary | ICD-10-CM

## 2023-01-16 DIAGNOSIS — T148XXA Other injury of unspecified body region, initial encounter: Secondary | ICD-10-CM

## 2023-01-16 MED ORDER — DOXYCYCLINE HYCLATE 100 MG PO TABS: 100.0000 mg | ORAL_TABLET | Freq: Two times a day (BID) | ORAL | 0 refills | Status: AC

## 2023-01-16 NOTE — Progress Notes (Signed)
Subjective:  Patient ID: [name], female    DOB: Jan 25, 1934, 89 y.o.   MRN: [id number]  Patient Care Team: [name], DO as PCP - General (Family Medicine) Rollene Rotunda, MD as PCP - Cardiology (Cardiology) Lonie Peak, MD as Attending Physician (Radiation Oncology) Malachy Mood, MD as Consulting Physician (Hematology) Suzi Roots as Physician Assistant (Dermatology) Zetta Bills, MD as Consulting Physician (Nephrology) [facility] Carnella Guadalajara, PA-C as Physician Assistant (Oncology)   Chief Complaint:  Bilateral legs red (Large blister on back of left leg/)   HPI: [name] is a 89 y.o. female presenting on 01/16/2023 for Bilateral legs red (Large blister on back of left leg/)   Pt presents today for bilateral lower extremity cellulitis and wound recheck. She is concerned due to the continued erythema and completion of antibiotics today. No systemic symptoms. No new injuries. Blood blister / hematoma to left lower extremity remains intact but pt feels this is bigger.    Relevant past medical, surgical, family, and social history reviewed and updated as indicated.  Allergies and medications reviewed and updated. Data reviewed: Chart in Epic.   Past Medical History:  Diagnosis Date   Anemia    Arthritis    Asthma    Atrial fibrillation (HCC)    Basal cell carcinoma 02/06/2009   Left ear targus- ([facility])   Basal cell carcinoma 09/28/2008   Right back-([id number])   CKD (chronic kidney disease), stage III (HCC)    Heart murmur    Hyperlipidemia    Hypertension    Hypothyroidism    Nodule of right lung    Right upper lobe   Osteopenia due to cancer therapy 10/07/2022   Pneumonia    PONV (postoperative nausea and vomiting)    Renal insufficiency    Chronic   Renal vascular disease    Right renal artery stenosis (HCC) 05/09/2015   Squamous cell carcinoma of skin 09/07/2019   KA-Left shin-txpbx   Vertigo      Past Surgical History:  Procedure Laterality Date   BIOPSY  04/09/2021   Procedure: BIOPSY;  Surgeon: Dolores Frame, MD;  [facility];  Service: Gastroenterology;;   BREAST LUMPECTOMY WITH RADIOACTIVE SEED LOCALIZATION Right 04/08/2018   Procedure: BREAST LUMPECTOMY WITH RADIOACTIVE SEED LOCALIZATION;  Surgeon: Emelia Loron, MD;  Location: [facility] OR;  Service: General;  Laterality: Right;   COLONOSCOPY  2016   diverticulosis, hemorrhoids   ESOPHAGOGASTRODUODENOSCOPY  2016   normal   ESOPHAGOGASTRODUODENOSCOPY (EGD) WITH PROPOFOL N/A 04/09/2021   Procedure: ESOPHAGOGASTRODUODENOSCOPY (EGD) WITH PROPOFOL;  Surgeon: Dolores Frame, MD;  Location: [facility];  Service: Gastroenterology;  Laterality: N/A;  12:45   ESOPHAGOGASTRODUODENOSCOPY (EGD) WITH PROPOFOL N/A 09/01/2021   Procedure: ESOPHAGOGASTRODUODENOSCOPY (EGD) WITH PROPOFOL;  Surgeon: Lanelle Bal, DO;  Location: [facility];  Service: Endoscopy;  Laterality: N/A;   IR GENERIC HISTORICAL  08/29/2016   IR US GUIDE VASC ACCESS RIGHT 08/29/2016 [facility]   IR GENERIC HISTORICAL  08/29/2016   IR RENAL BILAT S&I MOD SED 08/29/2016 [facility]   KNEE ARTHROSCOPY Right    2019   THYROIDECTOMY, PARTIAL Right 1981   middle lobe removed 1st, then right lobe removed 7-8 years later (approx. 64)    Social History   Socioeconomic History   Marital status: Widowed    Spouse name: Not on file   Number of children: 2   Years of education: 87  Highest education level: Not on file  Occupational History   Occupation: Retired  Tobacco Use   Smoking status: Never   Smokeless tobacco: Never  Vaping Use   Vaping Use: Never used  Substance and Sexual Activity   Alcohol use: No   Drug use: No   Sexual activity: Not Currently  Other Topics Concern   Not on file  Social History Narrative   Lives alone, but her daughter stays there at night   Ambidextrous (writes with left hand).   No  caffeine use.   Social Determinants of Health   Financial Resource Strain: Low Risk  (12/15/2022)   Overall Financial Resource Strain (CARDIA)    Difficulty of Paying Living Expenses: Not hard at all  Food Insecurity: No Food Insecurity (12/15/2022)   Hunger Vital Sign    Worried About Running Out of Food in the Last Year: Never true    Ran Out of Food in the Last Year: Never true  Transportation Needs: No Transportation Needs (12/15/2022)   PRAPARE - Administrator, Civil Service (Medical): No    Lack of Transportation (Non-Medical): No  Physical Activity: Inactive (12/15/2022)   Exercise Vital Sign    Days of Exercise per Week: 0 days    Minutes of Exercise per Session: 0 min  Stress: No Stress Concern Present (12/15/2022)   [employer] of Occupational Health - Occupational Stress Questionnaire    Feeling of Stress : Not at all  Social Connections: Moderately Isolated (12/15/2022)   Social Connection and Isolation Panel [NHANES]    Frequency of Communication with Friends and Family: More than three times a week    Frequency of Social Gatherings with Friends and Family: Three times a week    Attends Religious Services: More than 4 times per year    Active Member of Clubs or Organizations: No    Attends Banker Meetings: Never    Marital Status: Widowed  Intimate Partner Violence: Not At Risk (12/15/2022)   Humiliation, Afraid, Rape, and Kick questionnaire    Fear of Current or Ex-Partner: No    Emotionally Abused: No    Physically Abused: No    Sexually Abused: No    Outpatient Encounter Medications as of 01/16/2023  Medication Sig   acetaminophen (TYLENOL) 325 MG tablet Take 325 mg by mouth daily as needed for moderate pain or headache.   atorvastatin (LIPITOR) 20 MG tablet TAKE 1/2 TABLET BY MOUTH EVERY DAY   bisoprolol (ZEBETA) 5 MG tablet TAKE 1 TABLET (5 MG TOTAL) BY MOUTH DAILY.   cholecalciferol (VITAMIN D) 1000 units tablet Take 1,000 Units by  mouth daily.   cloNIDine (CATAPRES) 0.1 MG tablet Take 1 tablet (0.1 mg total) by mouth as directed. May take one tablet as needed with systolic blood pressure greater than 170   cyanocobalamin (VITAMIN B12) 500 MCG tablet Take 1 tablet (500 mcg total) by mouth daily.   doxycycline (VIBRA-TABS) 100 MG tablet Take 1 tablet (100 mg total) by mouth 2 (two) times daily for 10 days. 1 po bid   feeding supplement (ENSURE ENLIVE / ENSURE PLUS) LIQD Take 237 mLs by mouth 2 (two) times daily between meals.   ferrous sulfate 325 (65 FE) MG tablet Take 325 mg by mouth daily with breakfast.   flecainide (TAMBOCOR) 50 MG tablet TAKE 1 TABLET BY MOUTH TWICE A DAY   hydrALAZINE (APRESOLINE) 100 MG tablet Take 100 mg by mouth 2 (two) times daily.   letrozole [facility])  2.5 MG tablet TAKE 1 TABLET BY MOUTH EVERY DAY   levothyroxine (SYNTHROID) 75 MCG tablet TAKE 1 TABLET BY MOUTH EVERY DAY   losartan (COZAAR) 25 MG tablet Take 25 mg by mouth daily.   mirtazapine (REMERON) 7.5 MG tablet TAKE 1 TABLET BY MOUTH EVERYDAY AT BEDTIME   Multiple Vitamin (MULTIVITAMIN) tablet Take 1 tablet by mouth daily.   pantoprazole (PROTONIX) 40 MG tablet TAKE 1 TABLET (40 MG TOTAL) BY MOUTH TWICE A DAY BEFORE MEALS   No facility-administered encounter medications on file as of 01/16/2023.    Allergies  Allergen Reactions   Amlodipine Other (See Comments)    edema   Lisinopril     cough   Xeroform Occlusive Gauze Strip [Bismuth Tribromoph-Petrolatum]    Isosorbide Nitrate Nausea Only   Sulfonamide Derivatives Nausea And Vomiting and Rash   Terazosin Hives and Nausea Only    Review of Systems  Constitutional:  Negative for activity change, appetite change, chills, fatigue and fever.  HENT: Negative.    Eyes: Negative.   Respiratory:  Negative for cough, chest tightness and shortness of breath.   Cardiovascular:  Negative for chest pain, palpitations and leg swelling.  Gastrointestinal:  Negative for blood in stool,  constipation, diarrhea, nausea and vomiting.  Endocrine: Negative.   Genitourinary:  Negative for dysuria, frequency and urgency.  Musculoskeletal:  Negative for arthralgias and myalgias.  Skin:  Positive for color change and wound.  Allergic/Immunologic: Negative.   Neurological:  Negative for dizziness and headaches.  Hematological: Negative.   Psychiatric/Behavioral:  Negative for confusion, hallucinations, sleep disturbance and suicidal ideas.   All other systems reviewed and are negative.       Objective:  BP (!) 150/54   Pulse 62   Temp 97.7 F (36.5 C) (Temporal)   Resp 20   Ht 5\' 3"  (1.6 m)   Wt 110 lb (49.9 kg)   SpO2 93%   BMI 19.49 kg/m    Wt Readings from Last 3 Encounters:  01/16/23 110 lb (49.9 kg)  01/13/23 108 lb 3.2 oz (49.1 kg)  12/18/22 110 lb 6.4 oz (50.1 kg)    Physical Exam Vitals and nursing note reviewed.  Constitutional:      Comments: Frail elderly  HENT:     Head: Normocephalic and atraumatic.     Mouth/Throat:     Mouth: Mucous membranes are moist.  Eyes:     Pupils: Pupils are equal, round, and reactive to light.  Skin:    General: Skin is warm and dry.     Capillary Refill: Capillary refill takes less than 2 seconds.     Findings: Erythema and wound present.       Neurological:     Mental Status: She is alert.  Psychiatric:        Mood and Affect: Mood normal.        Behavior: Behavior normal.        Thought Content: Thought content normal.        Judgment: Judgment normal.     Results for orders placed or performed in visit on 11/03/22  TSH  Result Value Ref Range   TSH 3.070 0.450 - 4.500 uIU/mL  T4, Free  Result Value Ref Range   Free T4 1.74 0.82 - 1.77 ng/dL  Renal Function Panel  Result Value Ref Range   Glucose 124 (H) 70 - 99 mg/dL   BUN 23 8 - 27 mg/dL   Creatinine, Ser 1.61 (H) 0.57 - 1.00  mg/dL   eGFR 31 (L) >16 XW/RUE/4.54   BUN/Creatinine Ratio 14 12 - 28   Sodium 137 134 - 144 mmol/L   Potassium 4.1  3.5 - 5.2 mmol/L   Chloride 99 96 - 106 mmol/L   CO2 26 20 - 29 mmol/L   Calcium 8.4 (L) 8.7 - 10.3 mg/dL   Phosphorus 3.3 3.0 - 4.3 mg/dL   Albumin 4.2 3.7 - 4.7 g/dL  VITAMIN D 25 Hydroxy (Vit-D Deficiency, Fractures)  Result Value Ref Range   Vit D, 25-Hydroxy 60.3 30.0 - 100.0 ng/mL     I & D  Date/Time: 01/16/2023 9:57 AM  Performed by: Sonny Masters, FNP Authorized by: Sonny Masters, FNP   Consent:    Consent obtained:  Verbal   Consent given by:  Patient   Risks, benefits, and alternatives were discussed: yes     Risks discussed:  Bleeding, incomplete drainage, pain, damage to other organs and infection   Alternatives discussed:  No treatment, delayed treatment, alternative treatment, observation and referral Universal protocol:    Procedure explained and questions answered to patient or proxy's satisfaction: yes     Immediately prior to procedure a time out was called: yes     Patient identity confirmed:  Verbally with patient Location:    Type:  Hematoma   Size:  6 cm   Location:  Lower extremity   Lower extremity location:  Leg   Leg location:  L lower leg Pre-procedure details:    Skin preparation:  Povidone-iodine Sedation:    Sedation type:  None Anesthesia:    Anesthesia method:  None Procedure type:    Complexity:  Simple Procedure details:    Needle aspiration: no     Incision types:  Stab incision   Scalpel blade:  11   Drainage:  Bloody   Drainage amount:  Moderate   Wound treatment:  Wound left open   Packing materials:  None Post-procedure details:    Procedure completion:  Tolerated well, no immediate complications    Pertinent labs & imaging results that were available during my care of the patient were reviewed by me and considered in my medical decision making.  Assessment & Plan:  [name] was seen today for bilateral legs red.  Diagnoses and all orders for this visit:  Cellulitis of right lower extremity Cellulitis of left lower  extremity Has completed keflex and still has erythema. Will add below to regimen. Follow up in 2 weeks for reevaluation, sooner if warranted.  -     doxycycline (VIBRA-TABS) 100 MG tablet; Take 1 tablet (100 mg total) by mouth 2 (two) times daily for 10 days. 1 po bid  Blood blister Tolerated well. Dressing applied in office, wound care discussed in detail.  -     I & D     Continue all other maintenance medications.  Follow up plan: Return in about 2 weeks (around 01/30/2023), or if symptoms worsen or fail to improve, for cellulitis .   Continue healthy lifestyle choices, including diet (rich in fruits, vegetables, and lean proteins, and low in salt and simple carbohydrates) and exercise (at least 30 minutes of moderate physical activity daily).  Educational handout given for cellulitis   The above assessment and management plan was discussed with the patient. The patient verbalized understanding of and has agreed to the management plan. Patient is aware to call the clinic if they develop any new symptoms or if symptoms persist or worsen. Patient is aware  when to return to the clinic for a follow-up visit. Patient educated on when it is appropriate to go to the emergency department.   Kari Baars, FNP-C [facility] [phone]

## 2023-01-20 ENCOUNTER — Ambulatory Visit: Payer: PPO

## 2023-01-21 ENCOUNTER — Other Ambulatory Visit: Payer: Self-pay | Admitting: Hematology

## 2023-01-22 ENCOUNTER — Encounter: Payer: Self-pay | Admitting: Hematology

## 2023-01-22 ENCOUNTER — Other Ambulatory Visit: Payer: Self-pay | Admitting: *Deleted

## 2023-01-22 NOTE — Telephone Encounter (Signed)
Femara refill approved.  Patient is tolerating and is to continue therapy. 

## 2023-01-27 ENCOUNTER — Ambulatory Visit (INDEPENDENT_AMBULATORY_CARE_PROVIDER_SITE_OTHER): Payer: PPO | Admitting: Family Medicine

## 2023-01-27 ENCOUNTER — Encounter: Payer: Self-pay | Admitting: Family Medicine

## 2023-01-27 VITALS — BP 140/47 | HR 66 | Temp 96.7°F | Ht 63.0 in | Wt 106.4 lb

## 2023-01-27 DIAGNOSIS — T148XXA Other injury of unspecified body region, initial encounter: Secondary | ICD-10-CM

## 2023-01-27 DIAGNOSIS — L03115 Cellulitis of right lower limb: Secondary | ICD-10-CM | POA: Diagnosis not present

## 2023-01-27 DIAGNOSIS — L03116 Cellulitis of left lower limb: Secondary | ICD-10-CM

## 2023-01-27 NOTE — Progress Notes (Signed)
Subjective:  Patient ID: [name], female    DOB: [birth year], 89 y.o.   MRN: [id number]  Patient Care Team: [name], DO as PCP - General (Family Medicine) Rollene Rotunda, MD as PCP - Cardiology (Cardiology) Lonie Peak, MD as Attending Physician (Radiation Oncology) Malachy Mood, MD as Consulting Physician (Hematology) Suzi Roots as Physician Assistant (Dermatology) Dagoberto Ligas, MD as Consulting Physician (Nephrology) [facility] Darolyn Rua as Physician Assistant (Oncology)   Chief Complaint:  Cellulitis (Right lower extremity- 2 week follow up )   HPI: [name] is a 89 y.o. female presenting on 01/27/2023 for Cellulitis (Right lower extremity- 2 week follow up )    Bed mattress bruised and broke skin 3 weeks ago . Wound was debrided last week . Pain now 4/5 pain nothing taken for pain. Pt states both wounds are healing and understands it will be tender. Pt finished course of antibiotics yeaterday "I took my last pill yesterday". Was treated with Keflex and Doxycycline.    Cellulitis and wound follow up.    Relevant past medical, surgical, family, and social history reviewed and updated as indicated.  Allergies and medications reviewed and updated. Data reviewed: Chart in Epic.   Past Medical History:  Diagnosis Date   Anemia    Arthritis    Asthma    Atrial fibrillation (HCC)    Basal cell carcinoma 02/06/2009   Left ear targus- (MOHS)   Basal cell carcinoma 09/28/2008   Right back-(CX35FU)   CKD (chronic kidney disease), stage III (HCC)    Heart murmur    Hyperlipidemia    Hypertension    Hypothyroidism    Nodule of right lung    Right upper lobe   Osteopenia due to cancer therapy 10/07/2022   Pneumonia    PONV (postoperative nausea and vomiting)    Renal insufficiency    Chronic   Renal vascular disease    Right renal artery stenosis (HCC) 05/09/2015   Squamous cell carcinoma of  skin 09/07/2019   KA-Left shin-txpbx   Vertigo     Past Surgical History:  Procedure Laterality Date   BIOPSY  04/09/2021   Procedure: BIOPSY;  Surgeon: Dolores Frame, MD;  [facility];  Service: Gastroenterology;;   BREAST LUMPECTOMY WITH RADIOACTIVE SEED LOCALIZATION Right 04/08/2018   Procedure: BREAST LUMPECTOMY WITH RADIOACTIVE SEED LOCALIZATION;  Surgeon: Emelia Loron, MD;  Location: [facility] OR;  Service: General;  Laterality: Right;   COLONOSCOPY  2016   diverticulosis, hemorrhoids   ESOPHAGOGASTRODUODENOSCOPY  2016   normal   ESOPHAGOGASTRODUODENOSCOPY (EGD) WITH PROPOFOL N/A 04/09/2021   Procedure: ESOPHAGOGASTRODUODENOSCOPY (EGD) WITH PROPOFOL;  Surgeon: Dolores Frame, MD;  [facility];  Service: Gastroenterology;  Laterality: N/A;  12:45   ESOPHAGOGASTRODUODENOSCOPY (EGD) WITH PROPOFOL N/A 09/01/2021   Procedure: ESOPHAGOGASTRODUODENOSCOPY (EGD) WITH PROPOFOL;  Surgeon: Lanelle Bal, DO;  [facility];  Service: Endoscopy;  Laterality: N/A;   IR GENERIC HISTORICAL  08/29/2016   IR US GUIDE VASC ACCESS RIGHT 08/29/2016 [facility]   IR GENERIC HISTORICAL  08/29/2016   IR RENAL BILAT S&I MOD SED 08/29/2016 [facility]   KNEE ARTHROSCOPY Right    2019   THYROIDECTOMY, PARTIAL Right 1981   middle lobe removed 1st, then right lobe removed 7-8 years later (approx. 11)    Social History   Socioeconomic History   Marital status: Widowed    Spouse name: Not  on file   Number of children: 2   Years of education: 12   Highest education level: Not on file  Occupational History   Occupation: Retired  Tobacco Use   Smoking status: Never   Smokeless tobacco: Never  Vaping Use   Vaping Use: Never used  Substance and Sexual Activity   Alcohol use: No   Drug use: No   Sexual activity: Not Currently  Other Topics Concern   Not on file  Social History Narrative   Lives alone, but her daughter stays there at  night   Ambidextrous (writes with left hand).   No caffeine use.   Social Determinants of Health   Financial Resource Strain: Low Risk  (12/15/2022)   Overall Financial Resource Strain (CARDIA)    Difficulty of Paying Living Expenses: Not hard at all  Food Insecurity: No Food Insecurity (12/15/2022)   Hunger Vital Sign    Worried About Running Out of Food in the Last Year: Never true    Ran Out of Food in the Last Year: Never true  Transportation Needs: No Transportation Needs (12/15/2022)   PRAPARE - Administrator, Civil Service (Medical): No    Lack of Transportation (Non-Medical): No  Physical Activity: Inactive (12/15/2022)   Exercise Vital Sign    Days of Exercise per Week: 0 days    Minutes of Exercise per Session: 0 min  Stress: No Stress Concern Present (12/15/2022)   [employer] of Occupational Health - Occupational Stress Questionnaire    Feeling of Stress : Not at all  Social Connections: Moderately Isolated (12/15/2022)   Social Connection and Isolation Panel [NHANES]    Frequency of Communication with Friends and Family: More than three times a week    Frequency of Social Gatherings with Friends and Family: Three times a week    Attends Religious Services: More than 4 times per year    Active Member of Clubs or Organizations: No    Attends Banker Meetings: Never    Marital Status: Widowed  Intimate Partner Violence: Not At Risk (12/15/2022)   Humiliation, Afraid, Rape, and Kick questionnaire    Fear of Current or Ex-Partner: No    Emotionally Abused: No    Physically Abused: No    Sexually Abused: No    Outpatient Encounter Medications as of 01/27/2023  Medication Sig   acetaminophen (TYLENOL) 325 MG tablet Take 325 mg by mouth daily as needed for moderate pain or headache.   atorvastatin (LIPITOR) 20 MG tablet TAKE 1/2 TABLET BY MOUTH EVERY DAY   bisoprolol (ZEBETA) 5 MG tablet TAKE 1 TABLET (5 MG TOTAL) BY MOUTH DAILY.    cholecalciferol (VITAMIN D) 1000 units tablet Take 1,000 Units by mouth daily.   cloNIDine (CATAPRES) 0.1 MG tablet Take 1 tablet (0.1 mg total) by mouth as directed. May take one tablet as needed with systolic blood pressure greater than 170   cyanocobalamin (VITAMIN B12) 500 MCG tablet Take 1 tablet (500 mcg total) by mouth daily.   feeding supplement (ENSURE ENLIVE / ENSURE PLUS) LIQD Take 237 mLs by mouth 2 (two) times daily between meals.   ferrous sulfate 325 (65 FE) MG tablet Take 325 mg by mouth daily with breakfast.   flecainide (TAMBOCOR) 50 MG tablet TAKE 1 TABLET BY MOUTH TWICE A DAY   hydrALAZINE (APRESOLINE) 100 MG tablet Take 100 mg by mouth 2 (two) times daily.   letrozole (FEMARA) 2.5 MG tablet TAKE 1 TABLET BY MOUTH EVERY  DAY   levothyroxine (SYNTHROID) 75 MCG tablet TAKE 1 TABLET BY MOUTH EVERY DAY   losartan (COZAAR) 25 MG tablet Take 25 mg by mouth daily.   mirtazapine (REMERON) 7.5 MG tablet TAKE 1 TABLET BY MOUTH EVERYDAY AT BEDTIME   Multiple Vitamin (MULTIVITAMIN) tablet Take 1 tablet by mouth daily.   pantoprazole (PROTONIX) 40 MG tablet TAKE 1 TABLET (40 MG TOTAL) BY MOUTH TWICE A DAY BEFORE MEALS   No facility-administered encounter medications on file as of 01/27/2023.    Allergies  Allergen Reactions   Amlodipine Other (See Comments)    edema   Lisinopril     cough   Xeroform Occlusive Gauze Strip [Bismuth Tribromoph-Petrolatum]    Isosorbide Nitrate Nausea Only   Sulfonamide Derivatives Nausea And Vomiting and Rash   Terazosin Hives and Nausea Only    Review of Systems  Constitutional: Negative.   HENT:  Positive for hearing loss.   Eyes: Negative.   Respiratory: Negative.    Cardiovascular: Negative.   Endocrine: Negative.   Genitourinary: Negative.   Musculoskeletal: Negative.   Skin:  Positive for color change and wound.  Allergic/Immunologic: Negative.   Neurological: Negative.   Hematological: Negative.   Psychiatric/Behavioral: Negative.     All other systems reviewed and are negative.       Objective:  BP (!) 140/47   Pulse 66   Temp (!) 96.7 F (35.9 C) (Temporal)   Ht 5\' 3"  (1.6 m)   Wt 106 lb 6.4 oz (48.3 kg)   SpO2 92%   BMI 18.85 kg/m    Wt Readings from Last 3 Encounters:  01/27/23 106 lb 6.4 oz (48.3 kg)  01/16/23 110 lb (49.9 kg)  01/13/23 108 lb 3.2 oz (49.1 kg)    Physical Exam Vitals and nursing note reviewed.  Constitutional:      Appearance: Normal appearance. She is normal weight.  HENT:     Head: Normocephalic.     Nose: Nose normal.     Mouth/Throat:     Mouth: Mucous membranes are moist.  Eyes:     Pupils: Pupils are equal, round, and reactive to light.  Cardiovascular:     Rate and Rhythm: Normal rate.     Heart sounds: Murmur heard.  Pulmonary:     Effort: Pulmonary effort is normal.  Abdominal:     General: Abdomen is flat.     Palpations: Abdomen is soft.  Musculoskeletal:        General: Normal range of motion.     Cervical back: Normal range of motion and neck supple.  Skin:    Findings: Lesion present.          Comments: Wound  picture 1  Neurological:     General: No focal deficit present.     Mental Status: She is alert.         Results for orders placed or performed in visit on 11/03/22  TSH  Result Value Ref Range   TSH 3.070 0.450 - 4.500 uIU/mL  T4, Free  Result Value Ref Range   Free T4 1.74 0.82 - 1.77 ng/dL  Renal Function Panel  Result Value Ref Range   Glucose 124 (H) 70 - 99 mg/dL   BUN 23 8 - 27 mg/dL   Creatinine, Ser 1.61 (H) 0.57 - 1.00 mg/dL   eGFR 31 (L) >09 UE/AVW/0.98   BUN/Creatinine Ratio 14 12 - 28   Sodium 137 134 - 144 mmol/L   Potassium 4.1 3.5 -  5.2 mmol/L   Chloride 99 96 - 106 mmol/L   CO2 26 20 - 29 mmol/L   Calcium 8.4 (L) 8.7 - 10.3 mg/dL   Phosphorus 3.3 3.0 - 4.3 mg/dL   Albumin 4.2 3.7 - 4.7 g/dL  VITAMIN D 25 Hydroxy (Vit-D Deficiency, Fractures)  Result Value Ref Range   Vit D, 25-Hydroxy 60.3 30.0 - 100.0  ng/mL       Pertinent labs & imaging results that were available during my care of the patient were reviewed by me and considered in my medical decision making.  Assessment & Plan:  Destyn was seen today for cellulitis.  Diagnoses and all orders for this visit:  Cellulitis of left lower extremity Cellulitis of right lower extremity Blood blister Wounds have improved greatly, cellulitis resolved.  Xeroform dressing applied in office today and pt educated on proper wound care at home.  Wounds healing and are dry but intact. Around edges and wound base dry and cracking without drainage. Wound base black edges scaly pink and dry surround skin intact but dusky from knees to ankles. Dusky skin predates injury. Applied Xeroform guaze and coban dressing. Educated patient to change dressing daily.   Educated patient on S&S of infection and actions to take if S&S of infection develop. Seek medical attention immediately if unable to return to clinic then urgent care or emergency department.   Take Acetamenophen 325mg  every 6 hours daily as needed for discomfort of wounds not to exceed 4 grams every 24 hours.  Follow up plan: Return if symptoms worsen or fail to improve.  Educated patient on S&S of infection and actions to take if S&S of infection develop. Seek medical attention immediately if unable to return to clinic then urgent care or emergency department.   Return for follow-up in 6 weeks   Continue healthy lifestyle choices, including diet (rich in fruits, vegetables, and lean proteins, and low in salt and simple carbohydrates) and exercise (at least 30 minutes of moderate physical activity daily).   The above assessment and management plan was discussed with the patient. The patient verbalized understanding of and has agreed to the management plan. Patient is aware to call the clinic if they develop any new symptoms or if symptoms persist or worsen. Patient is aware when to return to the  clinic for a follow-up visit. Patient educated on when it is appropriate to go to the emergency department.   Maryelizabeth Kaufmann NP student [facility] [phone]  I personally was present during the history, physical exam, and medical decision-making activities of this visit and have verified that the services and findings are accurately documented in the nurse practitioner student's note.  Kari Baars, FNP-C [facility] [address] [place], [place] [address] [phone]

## 2023-01-27 NOTE — Patient Instructions (Signed)
Return to clinic in 6 weeks for wound  evaluation

## 2023-01-29 ENCOUNTER — Other Ambulatory Visit (INDEPENDENT_AMBULATORY_CARE_PROVIDER_SITE_OTHER): Payer: Self-pay | Admitting: Gastroenterology

## 2023-01-29 DIAGNOSIS — R630 Anorexia: Secondary | ICD-10-CM

## 2023-02-03 ENCOUNTER — Other Ambulatory Visit (INDEPENDENT_AMBULATORY_CARE_PROVIDER_SITE_OTHER): Payer: Self-pay | Admitting: Gastroenterology

## 2023-02-03 DIAGNOSIS — R630 Anorexia: Secondary | ICD-10-CM

## 2023-02-09 ENCOUNTER — Other Ambulatory Visit: Payer: Self-pay | Admitting: Gastroenterology

## 2023-02-09 DIAGNOSIS — R630 Anorexia: Secondary | ICD-10-CM

## 2023-02-09 MED ORDER — MIRTAZAPINE 7.5 MG PO TABS: ORAL_TABLET | ORAL | 3 refills | Status: DC

## 2023-02-23 ENCOUNTER — Other Ambulatory Visit: Payer: PPO

## 2023-02-23 ENCOUNTER — Other Ambulatory Visit: Payer: Self-pay

## 2023-02-23 ENCOUNTER — Other Ambulatory Visit: Payer: Self-pay | Admitting: Family Medicine

## 2023-02-23 DIAGNOSIS — N1832 Chronic kidney disease, stage 3b: Secondary | ICD-10-CM

## 2023-02-24 LAB — CBC WITH DIFFERENTIAL/PLATELET
Basophils Absolute: 0 10*3/uL (ref 0.0–0.2)
Basos: 0 %
EOS (ABSOLUTE): 0.1 10*3/uL (ref 0.0–0.4)
Eos: 2 %
Hematocrit: 30.8 % — ABNORMAL LOW (ref 34.0–46.6)
Hemoglobin: 9.9 g/dL — ABNORMAL LOW (ref 11.1–15.9)
Immature Grans (Abs): 0 10*3/uL (ref 0.0–0.1)
Immature Granulocytes: 0 %
Lymphocytes Absolute: 0.9 10*3/uL (ref 0.7–3.1)
Lymphs: 30 %
MCH: 27.5 pg (ref 26.6–33.0)
MCHC: 32.1 g/dL (ref 31.5–35.7)
MCV: 86 fL (ref 79–97)
Monocytes Absolute: 0.3 10*3/uL (ref 0.1–0.9)
Monocytes: 10 %
Neutrophils Absolute: 1.7 10*3/uL (ref 1.4–7.0)
Neutrophils: 58 %
RBC: 3.6 x10E6/uL — ABNORMAL LOW (ref 3.77–5.28)
RDW: 13.9 % (ref 11.7–15.4)
WBC: 2.9 10*3/uL — ABNORMAL LOW (ref 3.4–10.8)

## 2023-02-24 LAB — CMP14+EGFR
ALT: 8 IU/L (ref 0–32)
AST: 19 IU/L (ref 0–40)
Albumin: 4.2 g/dL (ref 3.7–4.7)
Alkaline Phosphatase: 63 IU/L (ref 44–121)
BUN/Creatinine Ratio: 14 (ref 12–28)
BUN: 26 mg/dL (ref 8–27)
Bilirubin Total: 0.5 mg/dL (ref 0.0–1.2)
CO2: 28 mmol/L (ref 20–29)
Calcium: 10 mg/dL (ref 8.7–10.3)
Chloride: 96 mmol/L (ref 96–106)
Creatinine, Ser: 1.92 mg/dL — ABNORMAL HIGH (ref 0.57–1.00)
Globulin, Total: 2.6 g/dL (ref 1.5–4.5)
Glucose: 103 mg/dL — ABNORMAL HIGH (ref 70–99)
Potassium: 4.3 mmol/L (ref 3.5–5.2)
Sodium: 138 mmol/L (ref 134–144)
Total Protein: 6.8 g/dL (ref 6.0–8.5)
eGFR: 25 mL/min/{1.73_m2} — ABNORMAL LOW (ref >59)

## 2023-02-24 LAB — LIPID PANEL
Chol/HDL Ratio: 2.4 ratio (ref 0.0–4.4)
Cholesterol, Total: 119 mg/dL (ref 100–199)
HDL: 49 mg/dL (ref >39)
LDL Chol Calc (NIH): 53 mg/dL (ref 0–99)
Triglycerides: 85 mg/dL (ref 0–149)
VLDL Cholesterol Cal: 17 mg/dL (ref 5–40)

## 2023-02-26 ENCOUNTER — Ambulatory Visit: Payer: PPO | Admitting: Gastroenterology

## 2023-02-26 NOTE — Progress Notes (Addendum)
Referring Provider: [name], DO Primary Care Physician:  [name], DO Primary GI Physician: Dr. Levon Hedger  Chief Complaint  Patient presents with   Follow-up    Follow up on appetite Pt needs refill on mirtazapine 7.5mg     HPI:   [name] is a 89 y.o. female with  past medical history of asthma, A fib, CKD, HLD, HTN, hypothyroidism, breast cancer, normocytic anemia/pancytopenia following with heme/onc, decreased appetite on Remeron, Candida esophagitis s/p treatment with Diflucan x 21 days August 2022, duodenal ulcer in January 2023, presenting today for follow-up.  Last seen in our office 12/18/2022 for follow-up of epigastric burning and poor appetite.  She reported feeling much better taking pantoprazole 40 mg twice daily.  Stated her appetite was back.  She is eating well.  Denied esophageal burning, nausea, vomiting.  Recommended continuing pantoprazole 40 mg twice daily x 4 weeks, then decrease to once daily.  Monitor for recurrent symptoms epigastric burning or decreased appetite.  Follow-up in 6 months.  Today:  Reports she has been doing fairly well overall. Ran out of mirtazapine about 3 weeks ago and noticed she started losing weight.  He does not have an appetite since running out of mirtazapine.  States she knows she needs to eat something, but nothing sounds good.  Still taking Pantoprazole BID. No epigastric burning or abdominal pain, nausea, vomiting, reflux symptoms, dysphagia, change in bowel habits, brbpr, melena. Takes 2 stool softeners night and has good Bms daily with this.   No NSAIDs.   Forgets Ensure at times.    Last Colonoscopy:(2016) diverticulosis, hemorrhoids   Last Endoscopy:09/01/21   - Esophageal plaques were found, suspicious for candidiasis. Biopsied-neg for candidiasis, consistent with reflux disease - Gastritis. Biopsied-mild chronic gastritis, neg for H pylori - Mucosal variant in the duodenum. - Non-bleeding duodenal  ulcer with no stigmata of bleeding. - Mild Schatzki ring.  Past Medical History:  Diagnosis Date   Anemia    Arthritis    Asthma    Atrial fibrillation (HCC)    Basal cell carcinoma 02/06/2009   Left ear targus- (MOHS)   Basal cell carcinoma 09/28/2008   Right back-(CX35FU)   CKD (chronic kidney disease), stage III (HCC)    Heart murmur    Hyperlipidemia    Hypertension    Hypothyroidism    Nodule of right lung    Right upper lobe   Osteopenia due to cancer therapy 10/07/2022   Pneumonia    PONV (postoperative nausea and vomiting)    Renal insufficiency    Chronic   Renal vascular disease    Right renal artery stenosis (HCC) 05/09/2015   Squamous cell carcinoma of skin 09/07/2019   KA-Left shin-txpbx   Vertigo     Past Surgical History:  Procedure Laterality Date   BIOPSY  04/09/2021   Procedure: BIOPSY;  Surgeon: Dolores Frame, MD;  [facility];  Service: Gastroenterology;;   BREAST LUMPECTOMY WITH RADIOACTIVE SEED LOCALIZATION Right 04/08/2018   Procedure: BREAST LUMPECTOMY WITH RADIOACTIVE SEED LOCALIZATION;  Surgeon: Emelia Loron, MD;  Location: [facility] OR;  Service: General;  Laterality: Right;   COLONOSCOPY  2016   diverticulosis, hemorrhoids   ESOPHAGOGASTRODUODENOSCOPY  2016   normal   ESOPHAGOGASTRODUODENOSCOPY (EGD) WITH PROPOFOL N/A 04/09/2021   Procedure: ESOPHAGOGASTRODUODENOSCOPY (EGD) WITH PROPOFOL;  Surgeon: Dolores Frame, MD;  [facility];  Service: Gastroenterology;  Laterality: N/A;  12:45   ESOPHAGOGASTRODUODENOSCOPY (EGD) WITH PROPOFOL N/A 09/01/2021  Procedure: ESOPHAGOGASTRODUODENOSCOPY (EGD) WITH PROPOFOL;  Surgeon: Lanelle Bal, DO;  [facility];  Service: Endoscopy;  Laterality: N/A;   IR GENERIC HISTORICAL  08/29/2016   IR US GUIDE VASC ACCESS RIGHT 08/29/2016 [facility]   IR GENERIC HISTORICAL  08/29/2016   IR RENAL BILAT S&I MOD SED 08/29/2016 [facility]   KNEE  ARTHROSCOPY Right    2019   THYROIDECTOMY, PARTIAL Right 1981   middle lobe removed 1st, then right lobe removed 7-8 years later (approx. 1988)    Current Outpatient Medications  Medication Sig Dispense Refill   acetaminophen (TYLENOL) 325 MG tablet Take 325 mg by mouth daily as needed for moderate pain or headache.     atorvastatin (LIPITOR) 20 MG tablet TAKE 1/2 TABLET BY MOUTH EVERY DAY 45 tablet 0   bisoprolol (ZEBETA) 5 MG tablet TAKE 1 TABLET (5 MG TOTAL) BY MOUTH DAILY. 90 tablet 3   cholecalciferol (VITAMIN D) 1000 units tablet Take 1,000 Units by mouth daily.     cloNIDine (CATAPRES) 0.1 MG tablet Take 1 tablet (0.1 mg total) by mouth as directed. May take one tablet as needed with systolic blood pressure greater than 170 30 tablet 1   cyanocobalamin (VITAMIN B12) 500 MCG tablet Take 1 tablet (500 mcg total) by mouth daily.     feeding supplement (ENSURE ENLIVE / ENSURE PLUS) LIQD Take 237 mLs by mouth 2 (two) times daily between meals. 237 mL 12   ferrous sulfate 325 (65 FE) MG tablet Take 325 mg by mouth daily with breakfast.     flecainide (TAMBOCOR) 50 MG tablet TAKE 1 TABLET BY MOUTH TWICE A DAY 180 tablet 3   hydrALAZINE (APRESOLINE) 100 MG tablet Take 100 mg by mouth 2 (two) times daily.     letrozole (FEMARA) 2.5 MG tablet TAKE 1 TABLET BY MOUTH EVERY DAY 90 tablet 4   levothyroxine (SYNTHROID) 75 MCG tablet TAKE 1 TABLET BY MOUTH EVERY DAY 90 tablet 3   losartan (COZAAR) 25 MG tablet Take 25 mg by mouth daily.     Multiple Vitamin (MULTIVITAMIN) tablet Take 1 tablet by mouth daily.     pantoprazole (PROTONIX) 40 MG tablet TAKE 1 TABLET (40 MG TOTAL) BY MOUTH TWICE A DAY BEFORE MEALS 180 tablet 1   mirtazapine (REMERON) 7.5 MG tablet TAKE 1 TABLET BY MOUTH EVERYDAY AT BEDTIME 90 tablet 3   No current facility-administered medications for this visit.    Allergies as of 02/27/2023 - Review Complete 02/27/2023  Allergen Reaction Noted   Amlodipine Other (See Comments)  10/24/2015   Lisinopril  09/30/2021   Xeroform occlusive gauze strip [bismuth tribromoph-petrolatum]  05/30/2021   Isosorbide nitrate Nausea Only 06/19/2014   Sulfonamide derivatives Nausea And Vomiting and Rash 12/19/2009   Terazosin Hives and Nausea Only 06/10/2013    Family History  Problem Relation Age of Onset   Thyroid disease Mother    Stroke Father    Hypertension Brother    Lung cancer Maternal Uncle    Cancer Maternal Grandmother        Liver   Lung cancer Maternal Uncle    Hypertension Daughter    Hypercholesterolemia Daughter    Hypercholesterolemia Daughter     Social History   Socioeconomic History   Marital status: Widowed    Spouse name: Not on file   Number of children: 2   Years of education: 72   Highest education level: Not on file  Occupational History   Occupation: Retired  Tobacco Use   Smoking status: Never   Smokeless tobacco: Never  Vaping Use   Vaping status: Never Used  Substance and Sexual Activity   Alcohol use: No   Drug use: No   Sexual activity: Not Currently  Other Topics Concern   Not on file  Social History Narrative   Lives alone, but her daughter stays there at night   Ambidextrous (writes with left hand).   No caffeine use.   Social Determinants of Health   Financial Resource Strain: Low Risk  (12/15/2022)   Overall Financial Resource Strain (CARDIA)    Difficulty of Paying Living Expenses: Not hard at all  Food Insecurity: No Food Insecurity (12/15/2022)   Hunger Vital Sign    Worried About Running Out of Food in the Last Year: Never true    Ran Out of Food in the Last Year: Never true  Transportation Needs: No Transportation Needs (12/15/2022)   PRAPARE - Administrator, Civil Service (Medical): No    Lack of Transportation (Non-Medical): No  Physical Activity: Inactive (12/15/2022)   Exercise Vital Sign    Days of Exercise per Week: 0 days    Minutes of Exercise per Session: 0 min  Stress: No Stress  Concern Present (12/15/2022)   [employer] of Occupational Health - Occupational Stress Questionnaire    Feeling of Stress : Not at all  Social Connections: Moderately Isolated (12/15/2022)   Social Connection and Isolation Panel [NHANES]    Frequency of Communication with Friends and Family: More than three times a week    Frequency of Social Gatherings with Friends and Family: Three times a week    Attends Religious Services: More than 4 times per year    Active Member of Clubs or Organizations: No    Attends Banker Meetings: Never    Marital Status: Widowed    Review of Systems: Gen: Denies fever, chills, cold or flulike symptoms, presyncope, syncope. CV: Denies chest pain, palpitations. Resp: Denies dyspnea, cough. GI: See HPI Heme: See HPI  Physical Exam: BP (!) 124/47   Pulse 71   Temp 97.7 F (36.5 C)   Ht 5\' 4"  (1.626 m)   Wt 105 lb 12.8 oz (48 kg)   BMI 18.16 kg/m  General:   Alert and oriented. No distress noted. Pleasant and cooperative.  Head:  Normocephalic and atraumatic. Eyes:  Conjuctiva clear without scleral icterus. Heart:  S1, S2 present without murmurs appreciated. Lungs:  Clear to auscultation bilaterally. No wheezes, rales, or rhonchi. No distress.  Abdomen:  +BS, soft, non-tender and non-distended. No rebound or guarding. No HSM or masses noted. Msk:  Symmetrical without gross deformities. Normal posture. Extremities:  Without edema. Neurologic:  Alert and  oriented x4 Psych:  Normal mood and affect.    Assessment:   89 y.o. female with  past medical history of asthma, A fib, CKD, HLD, HTN, hypothyroidism, breast cancer, normocytic anemia/pancytopenia following with heme/onc, decreased appetite on Remeron, Candida esophagitis s/p treatment with Diflucan x 21 days August 2022, duodenal ulcer in January 2023, presenting today for routine follow-up.   Lack of appetite/weight loss:  Ran out of Remeron about 3 weeks ago and has since  had decreased appetite and associated weight loss.  Had been doing well prior to this.  No significant GI symptoms.  She is maintaining on PPI twice daily which she has been on for several months now.  Denies NSAIDs.  Will resume Remeron and see how she  does.  Would be okay for her to decrease pantoprazole to once daily and monitor for any recurrent upper GI symptoms.  Plan:  Resume Remeron 7.5 mg at bedtime. Decrease pantoprazole to 40 mg once daily. Eat 3 meals daily and a couple of snacks daily. Drink at least 1 Ensure daily. Requested patient to continue to monitor her weight and let me know if she loses an additional 5 pounds. Follow-up in 3 months or sooner if needed.   Janet Memos, PA-C [facility] 02/27/2023   I have reviewed the note and agree with the APP's assessment as described in this progress note  Katrinka Blazing, MD Gastroenterology and Hepatology [facility]

## 2023-02-27 ENCOUNTER — Ambulatory Visit: Payer: PPO | Admitting: Gastroenterology

## 2023-02-27 ENCOUNTER — Encounter: Payer: Self-pay | Admitting: Gastroenterology

## 2023-02-27 VITALS — BP 124/47 | HR 71 | Temp 97.7°F | Ht 64.0 in | Wt 105.8 lb

## 2023-02-27 DIAGNOSIS — R634 Abnormal weight loss: Secondary | ICD-10-CM

## 2023-02-27 DIAGNOSIS — R630 Anorexia: Secondary | ICD-10-CM | POA: Diagnosis not present

## 2023-02-27 MED ORDER — MIRTAZAPINE 7.5 MG PO TABS: ORAL_TABLET | ORAL | 3 refills | Status: DC

## 2023-02-27 NOTE — Patient Instructions (Addendum)
Resume taking mirtazapine 7.5 mg at bedtime to help with your appetite.  Be sure that you are eating 3 meals a day and a couple of snacks a day.  I recommend that you drink at least 1 Ensure daily.  Be sure that you are drinking enough fluid to keep your urine pale yellow to clear.  As we discussed, if you do not like plain water, you can try using flavoring.  I like MiO which is a liquid that you can scored into the water.  You may decrease pantoprazole to 40 mg once daily.  We will plan to follow-up with you in the office in about 3 months or sooner if needed.  Do not hesitate to call if you have any questions or concerns.  It was great to see you again today!  Ermalinda Memos, PA-C [facility]

## 2023-03-03 ENCOUNTER — Ambulatory Visit: Payer: PPO | Admitting: Family Medicine

## 2023-03-03 ENCOUNTER — Encounter: Payer: Self-pay | Admitting: Family Medicine

## 2023-03-03 ENCOUNTER — Ambulatory Visit: Payer: PPO

## 2023-03-03 VITALS — BP 132/54 | HR 60 | Temp 97.9°F | Ht 64.0 in | Wt 106.0 lb

## 2023-03-03 DIAGNOSIS — R234 Changes in skin texture: Secondary | ICD-10-CM | POA: Diagnosis not present

## 2023-03-03 DIAGNOSIS — E034 Atrophy of thyroid (acquired): Secondary | ICD-10-CM

## 2023-03-03 DIAGNOSIS — L818 Other specified disorders of pigmentation: Secondary | ICD-10-CM | POA: Diagnosis not present

## 2023-03-03 DIAGNOSIS — N184 Chronic kidney disease, stage 4 (severe): Secondary | ICD-10-CM | POA: Diagnosis not present

## 2023-03-03 NOTE — Progress Notes (Signed)
Subjective: CC: Chronic follow-up PCP: [name], DO [name] is a 89 y.o. female presenting to clinic today for:  1.  Chronic kidney disease stage III/IV Had labs done which showed progression of renal disease.  Has an appointment with Dr. Allena Katz in August for follow-up.  She admits that she has not been hydrating well but has subsequently started adding flavored drops to her water and now is drinking more water.  She continues to drink boost or Ensure once daily.  She reports appetite has improved since resuming mirtazapine  2.  Hypothyroidism Compliant with Synthroid.  No reports of tremor, heart palpitations or tachycardia.  No changes in bowel habit  3.  Thin skin Patient reports that her skin is so thin and tore easily recently such that she developed a cellulitis in her leg.  She is status post treatment for that and asked how she can improve the integrity of her skin   ROS: Per HPI  Allergies  Allergen Reactions   Amlodipine Other (See Comments)    edema   Lisinopril     cough   Xeroform Occlusive Gauze Strip [Bismuth Tribromoph-Petrolatum]    Isosorbide Nitrate Nausea Only   Sulfonamide Derivatives Nausea And Vomiting and Rash   Terazosin Hives and Nausea Only   Past Medical History:  Diagnosis Date   Anemia    Arthritis    Asthma    Atrial fibrillation (HCC)    Basal cell carcinoma 02/06/2009   Left ear targus- (MOHS)   Basal cell carcinoma 09/28/2008   Right back-(CX35FU)   CKD (chronic kidney disease), stage III (HCC)    Heart murmur    Hyperlipidemia    Hypertension    Hypothyroidism    Nodule of right lung    Right upper lobe   Osteopenia due to cancer therapy 10/07/2022   Pneumonia    PONV (postoperative nausea and vomiting)    Renal insufficiency    Chronic   Renal vascular disease    Right renal artery stenosis (HCC) 05/09/2015   Squamous cell carcinoma of skin 09/07/2019   KA-Left shin-txpbx   Vertigo     Current  Outpatient Medications:    acetaminophen (TYLENOL) 325 MG tablet, Take 325 mg by mouth daily as needed for moderate pain or headache., Disp: , Rfl:    atorvastatin (LIPITOR) 20 MG tablet, TAKE 1/2 TABLET BY MOUTH EVERY DAY, Disp: 45 tablet, Rfl: 0   bisoprolol (ZEBETA) 5 MG tablet, TAKE 1 TABLET (5 MG TOTAL) BY MOUTH DAILY., Disp: 90 tablet, Rfl: 3   cholecalciferol (VITAMIN D) 1000 units tablet, Take 1,000 Units by mouth daily., Disp: , Rfl:    cloNIDine (CATAPRES) 0.1 MG tablet, Take 1 tablet (0.1 mg total) by mouth as directed. May take one tablet as needed with systolic blood pressure greater than 170, Disp: 30 tablet, Rfl: 1   cyanocobalamin (VITAMIN B12) 500 MCG tablet, Take 1 tablet (500 mcg total) by mouth daily., Disp: , Rfl:    feeding supplement (ENSURE ENLIVE / ENSURE PLUS) LIQD, Take 237 mLs by mouth 2 (two) times daily between meals., Disp: 237 mL, Rfl: 12   ferrous sulfate 325 (65 FE) MG tablet, Take 325 mg by mouth daily with breakfast., Disp: , Rfl:    flecainide (TAMBOCOR) 50 MG tablet, TAKE 1 TABLET BY MOUTH TWICE A DAY, Disp: 180 tablet, Rfl: 3   hydrALAZINE (APRESOLINE) 100 MG tablet, Take 100 mg by mouth 2 (two) times daily., Disp: , Rfl:  letrozole (FEMARA) 2.5 MG tablet, TAKE 1 TABLET BY MOUTH EVERY DAY, Disp: 90 tablet, Rfl: 4   levothyroxine (SYNTHROID) 75 MCG tablet, TAKE 1 TABLET BY MOUTH EVERY DAY, Disp: 90 tablet, Rfl: 3   losartan (COZAAR) 25 MG tablet, Take 25 mg by mouth daily., Disp: , Rfl:    mirtazapine (REMERON) 7.5 MG tablet, TAKE 1 TABLET BY MOUTH EVERYDAY AT BEDTIME, Disp: 90 tablet, Rfl: 3   Multiple Vitamin (MULTIVITAMIN) tablet, Take 1 tablet by mouth daily., Disp: , Rfl:    pantoprazole (PROTONIX) 40 MG tablet, TAKE 1 TABLET (40 MG TOTAL) BY MOUTH TWICE A DAY BEFORE MEALS, Disp: 180 tablet, Rfl: 1 Social History   Socioeconomic History   Marital status: Widowed    Spouse name: Not on file   Number of children: 2   Years of education: 56   Highest  education level: Not on file  Occupational History   Occupation: Retired  Tobacco Use   Smoking status: Never   Smokeless tobacco: Never  Vaping Use   Vaping status: Never Used  Substance and Sexual Activity   Alcohol use: No   Drug use: No   Sexual activity: Not Currently  Other Topics Concern   Not on file  Social History Narrative   Lives alone, but her daughter stays there at night   Ambidextrous (writes with left hand).   No caffeine use.   Social Determinants of Health   Financial Resource Strain: Low Risk  (12/15/2022)   Overall Financial Resource Strain (CARDIA)    Difficulty of Paying Living Expenses: Not hard at all  Food Insecurity: No Food Insecurity (12/15/2022)   Hunger Vital Sign    Worried About Running Out of Food in the Last Year: Never true    Ran Out of Food in the Last Year: Never true  Transportation Needs: No Transportation Needs (12/15/2022)   PRAPARE - Administrator, Civil Service (Medical): No    Lack of Transportation (Non-Medical): No  Physical Activity: Inactive (12/15/2022)   Exercise Vital Sign    Days of Exercise per Week: 0 days    Minutes of Exercise per Session: 0 min  Stress: No Stress Concern Present (12/15/2022)   [employer] of Occupational Health - Occupational Stress Questionnaire    Feeling of Stress : Not at all  Social Connections: Moderately Isolated (12/15/2022)   Social Connection and Isolation Panel [NHANES]    Frequency of Communication with Friends and Family: More than three times a week    Frequency of Social Gatherings with Friends and Family: Three times a week    Attends Religious Services: More than 4 times per year    Active Member of Clubs or Organizations: No    Attends Banker Meetings: Never    Marital Status: Widowed  Intimate Partner Violence: Not At Risk (12/15/2022)   Humiliation, Afraid, Rape, and Kick questionnaire    Fear of Current or Ex-Partner: No    Emotionally Abused: No     Physically Abused: No    Sexually Abused: No   Family History  Problem Relation Age of Onset   Thyroid disease Mother    Stroke Father    Hypertension Brother    Lung cancer Maternal Uncle    Cancer Maternal Grandmother        Liver   Lung cancer Maternal Uncle    Hypertension Daughter    Hypercholesterolemia Daughter    Hypercholesterolemia Daughter     Objective: Office vital  signs reviewed. BP (!) 160/58   Pulse 60   Temp 97.9 F (36.6 C)   Ht 5\' 4"  (1.626 m)   Wt 106 lb (48.1 kg)   SpO2 90%   BMI 18.19 kg/m   Physical Examination:  General: Awake, alert, thin, elderly female, No acute distress HEENT: sclera white, mmm Cardio: regular rate and rhythm, S1S2 heard, 3/6 murmur appreciated Pulm: clear to auscultation bilaterally, no wheezes, rhonchi or rales; normal work of breathing on room air Skin: Hemosiderin deposition noted throughout bilateral lower extremities.  She has healing ecchymosis of bilateral upper extremities.  Skin very dry and thin.  Assessment/ Plan: 89 y.o. female   Hypothyroidism due to acquired atrophy of thyroid - Plan: TSH, T4, free  Stage 4 chronic kidney disease (HCC) - Plan: Renal Function Panel  Hemosiderin pigmentation of skin  Thin skin  Future orders for thyroid and renal function placed.  Discussed drop in renal function on last labs. Sounds like she has not been hydrating well.  Discussed skin care.  Follow up in 4-6 months, sooner if concerns arise  No orders of the defined types were placed in this encounter.  No orders of the defined types were placed in this encounter.    [name], DO [facility] [phone]

## 2023-03-03 NOTE — Patient Instructions (Signed)
 Basic Skin Care Your skin plays an important role in keeping the entire body healthy.  Below are some tips on how to try and maximize skin health from the outside in.  Bathe in mildly warm water every 1 to 3 days, followed by light drying and an application of a thick moisturizer cream or ointment, preferably one that comes in a tub. Fragrance free moisturizing bars or body washes are preferred such as Purpose, Cetaphil, Dove sensitive skin, Aveeno, [place] Baby or Vanicream products. Use a fragrance free cream or ointment, not a lotion, such as plain petroleum jelly or Vaseline ointment, Aquaphor, Vanicream, Eucerin cream or a generic version, CeraVe Cream, Cetaphil Restoraderm, Aveeno Eczema Therapy and [place] Baby Calming, among others. Children with very dry skin often need to put on these creams two, three or four times a day.  As much as possible, use these creams enough to keep the skin from looking dry. Consider using fragrance free/dye free detergent, such as Arm and Hammer for sensitive skin, Tide Free or All Free.   If I am prescribing a medication to go on the skin, the medicine goes on first to the areas that need it, followed by a thick cream as above to the entire body.  Sun is a major cause of damage to the skin. I recommend sun protection for all of my patients. I prefer physical barriers such as hats with wide brims that cover the ears, long sleeve clothing with SPF protection including rash guards for swimming. These can be found seasonally at outdoor clothing companies, [employer] and [employer] and online at www.coolibar.com, www.uvskinz.com and www.sunprecautions.com. Avoid peak sun between the hours of 10am to 3pm to minimize sun exposure.  I recommend sunscreen for all of my patients older than 6 months of age when in the sun, preferably with broad spectrum coverage and SPF 30 or higher.  For children, I recommend sunscreens that only contain titanium dioxide and/or zinc oxide  in the active ingredients. These do not burn the eyes and appear to be safer than chemical sunscreens. These sunscreens include zinc oxide paste found in the diaper section, Vanicream Broad Spectrum 50+, Aveeno Natural Mineral Protection, Neutrogena Pure and Free Baby, Johnson [employer] Johnson Baby Daily face and body lotion, [place] Baby products, among others. There is no such thing as waterproof sunscreen. All sunscreens should be reapplied after 60-80 minutes of wear.  Spray on sunscreens often use chemical sunscreens which do protect against the sun. However, these can be difficult to apply correctly, especially if wind is present, and can be more likely to irritate the skin.  Long term effects of chemical sunscreens are also not fully known.     

## 2023-03-10 ENCOUNTER — Telehealth: Payer: Self-pay | Admitting: Family Medicine

## 2023-03-10 NOTE — Telephone Encounter (Signed)
Pt called to speak with Dr Nadine Counts to let her know that she needs oxygen. Says her oxygen has been running in the 80s recently. Says if she is sitting down then her oxygen will go back up to the 90s. Wants to know if she can get oxygen through [employer] like she has in the past.

## 2023-03-10 NOTE — Telephone Encounter (Signed)
We can certainly order but she will have to come in for a formal walking oxygen test so that we can appropriately document for insurance purposes.  Ok to put on DOD Friday if there is nothing else available with me this week.

## 2023-03-10 NOTE — Telephone Encounter (Signed)
Reviewed Dr Nadine Counts note with pt. Pt says she can't wait until Friday. Requested to see someone today. Explained to pt that we are booked today but that I could get her in tomorrow with a different provider. Pt voiced understanding and agreed to see Jerrel Ivory tomorrow for this.

## 2023-03-11 ENCOUNTER — Ambulatory Visit: Payer: PPO | Admitting: Family Medicine

## 2023-03-11 ENCOUNTER — Encounter: Payer: Self-pay | Admitting: Family Medicine

## 2023-03-11 VITALS — BP 134/55 | HR 66 | Temp 98.5°F | Ht 64.0 in | Wt 106.0 lb

## 2023-03-11 DIAGNOSIS — R0609 Other forms of dyspnea: Secondary | ICD-10-CM | POA: Diagnosis not present

## 2023-03-11 DIAGNOSIS — R0902 Hypoxemia: Secondary | ICD-10-CM | POA: Diagnosis not present

## 2023-03-11 DIAGNOSIS — J449 Chronic obstructive pulmonary disease, unspecified: Secondary | ICD-10-CM

## 2023-03-11 NOTE — Patient Instructions (Signed)
Coralyn Helling, MD [facility] Pager - [phone] 08/22/2021, 11:57 AM   Use O2 while walking. Goal saturation 88-92%, may go up to 94%

## 2023-03-11 NOTE — Progress Notes (Signed)
Acute Office Visit  Subjective:  Patient ID: [name], female    DOB: [birth year], 89 y.o.   MRN: [id number]  Chief Complaint  Patient presents with   Shortness of Breath    Needs order for oxygen   HPI Patient is in today for increased shortness of breath. She presents today with her daughter who supports her history. States that she has been short of breath for about 1 year, but it is getting worse. Reports that she was admitted for bronchitis about 1.5 years ago (~Dec 2023) and had O2 after her admission. Then in January/February she was admitted for RSV and received O2 after. States that she was not using it much and did not want ot be dependent on it, so she stopped using it altogether. Reports that she feels short of breath when she is ambulating and talking. She has started walking slower to compensate for her symptoms. Of note, she has a follow up next week with cardiology.   ROS As per HPI   Objective:  BP (!) 134/55   Pulse 66   Temp 98.5 F (36.9 C)   Ht 5\' 4"  (1.626 m)   Wt 106 lb (48.1 kg)   SpO2 91% Comment: 91% high - 87% low  BMI 18.19 kg/m   Sitting Room Air spO2 90%  Ambulating Room Air spO2 85%  Sitting 2 Liters [place] spO2 96%  Ambulating 2 Liters [place] spO2 93%   Physical Exam Constitutional:      General: She is awake. She is not in acute distress.    Appearance: Normal appearance. She is well-developed and well-groomed. She is not ill-appearing, toxic-appearing or diaphoretic.  Cardiovascular:     Rate and Rhythm: Normal rate and regular rhythm.     Pulses: Normal pulses.          Radial pulses are 2+ on the right side and 2+ on the left side.       Posterior tibial pulses are 2+ on the right side and 2+ on the left side.     Heart sounds: Murmur heard.     No gallop.  Pulmonary:     Effort: Pulmonary effort is normal. No respiratory distress.     Breath sounds: Decreased air movement present. No stridor. Examination of the right-middle field  reveals decreased breath sounds. Examination of the left-middle field reveals decreased breath sounds. Examination of the right-lower field reveals decreased breath sounds. Examination of the left-lower field reveals decreased breath sounds. Decreased breath sounds present. No wheezing, rhonchi or rales.  Musculoskeletal:     Cervical back: Full passive range of motion without pain and neck supple.     Right lower leg: No edema.     Left lower leg: No edema.  Skin:    General: Skin is warm.     Capillary Refill: Capillary refill takes less than 2 seconds.  Neurological:     General: No focal deficit present.     Mental Status: She is alert, oriented to person, place, and time and easily aroused. Mental status is at baseline.     GCS: GCS eye subscore is 4. GCS verbal subscore is 5. GCS motor subscore is 6.     Motor: No weakness.  Psychiatric:        Attention and Perception: Attention and perception normal.        Mood and Affect: Mood and affect normal.        Speech: Speech normal.  Behavior: Behavior normal. Behavior is cooperative.        Thought Content: Thought content normal. Thought content does not include homicidal or suicidal ideation. Thought content does not include homicidal or suicidal plan.        Cognition and Memory: Cognition and memory normal.        Judgment: Judgment normal.       03/11/2023   11:16 AM 03/03/2023    9:27 AM 01/27/2023    9:27 AM  Depression screen PHQ 2/9  Decreased Interest 0 0 0  Down, Depressed, Hopeless 0 0 0  PHQ - 2 Score 0 0 0  Altered sleeping 0 0   Tired, decreased energy 0 0   Change in appetite 0 0   Feeling bad or failure about yourself  0 0   Trouble concentrating 0 0   Moving slowly or fidgety/restless 0 0   Suicidal thoughts 0 0   PHQ-9 Score 0 0   Difficult doing work/chores Not difficult at all Not difficult at all       03/11/2023   11:17 AM 03/03/2023    9:27 AM 01/16/2023    9:42 AM 01/13/2023    9:39 AM  GAD 7  : Generalized Anxiety Score  Nervous, Anxious, on Edge 0 0 0 0  Control/stop worrying 0 0 0 0  Worry too much - different things 0 0 0 0  Trouble relaxing 0 0 0 0  Restless 0 0 0 0  Easily annoyed or irritable 0 0 0 0  Afraid - awful might happen 0 0 0 0  Total GAD 7 Score 0 0 0 0  Anxiety Difficulty Not difficult at all Not difficult at all Not difficult at all Not difficult at all    Assessment & Plan:  1. Hypoxia Sitting Room Air spO2 90%  Ambulating Room Air spO2 85%  Sitting 2 Liters [place] spO2 96%  Ambulating 2 Liters [place] spO2 93% Referral placed as below.  Order for home O2 placed. Discussed with patient and daughter that this is for ambulation. Discussed goal O2 of 88-92% with diagnosis of COPD. Discussed that she can go up to 94%, but does not need to remain 95% and above. Collaborated with Nadine Counts, DO for care plan.  - Ambulatory referral to Pulmonology - For home use only DME oxygen  2. Dyspnea on exertion See above. Patient to follow up with PCP and Pulmonology.   3. Chronic obstructive pulmonary disease, unspecified COPD type (HCC) Reviewed imaging. Patient was seen by pulmonology on 08/22/21 for hospital follow up for bronchitis 08/01/21-08/08/21. She was instructed to continue Symbicort and follow up in 2 months. Has been lost to follow up. Referral as above to ensure that patient re-establishes care. Provided patient with contact information to call for an appt.   DG Chest 2 View 08/31/21  IMPRESSION: Stable cardiomegaly and COPD. No active lung disease.  NM Pulmonary Perfusion 06/17/22 IMPRESSION: Scattered peripheral perfusion changes particularly lateral RIGHT middle lobe, pattern most consistent with parenchymal lung disease and the emphysematous changes and interstitial infiltrates seen on chest radiograph. Overall pattern is not suggestive pulmonary embolism. Electronically Signed   By: Ulyses Southward M.D.   On: 06/17/2022 11:50  The above assessment and  management plan was discussed with the patient. The patient verbalized understanding of and has agreed to the management plan using shared-decision making. Patient is aware to call the clinic if they develop any new symptoms or if symptoms fail to improve or  worsen. Patient is aware when to return to the clinic for a follow-up visit. Patient educated on when it is appropriate to go to the emergency department.   Return for with PCP as scheduled .  Neale Burly, DNP-FNP [facility] [address] [place], [place] [address] [phone]

## 2023-03-13 DIAGNOSIS — I5032 Chronic diastolic (congestive) heart failure: Secondary | ICD-10-CM | POA: Diagnosis not present

## 2023-03-13 DIAGNOSIS — J449 Chronic obstructive pulmonary disease, unspecified: Secondary | ICD-10-CM | POA: Diagnosis not present

## 2023-03-13 DIAGNOSIS — R0902 Hypoxemia: Secondary | ICD-10-CM | POA: Diagnosis not present

## 2023-03-16 ENCOUNTER — Other Ambulatory Visit: Payer: Self-pay | Admitting: Family Medicine

## 2023-03-16 DIAGNOSIS — I5032 Chronic diastolic (congestive) heart failure: Secondary | ICD-10-CM

## 2023-03-16 DIAGNOSIS — J449 Chronic obstructive pulmonary disease, unspecified: Secondary | ICD-10-CM

## 2023-03-16 DIAGNOSIS — R0902 Hypoxemia: Secondary | ICD-10-CM

## 2023-03-16 NOTE — Addendum Note (Signed)
Addended by: [name] on: 03/16/2023 04:30 PM   Modules accepted: Orders

## 2023-03-17 NOTE — Progress Notes (Unsigned)
Cardiology Office Note:   Date:  03/18/2023  ID:  [name], DOB 04/06/1934, MRN [id number] PCP: [name], DO  [facility] Providers Cardiologist:  Rollene Rotunda, MD {  History of Present Illness:   [name] is a 89 y.o. female  with difficult to control with chronic diastolic CHF, and arrhythmias.  She was in the hospital with hypoxic respiratory failure in October 2023.   I reviewed these records for this visit.  She had pneumonia and a CHF exacerbation.   She had mild AKI with her creat being 1.56 at discharge which was down from 1.93.   She was discharged on O2.  She was again in the hospital on January 2024 with pneumonia. She was sent home on 2 L of oxygen again.  She had some acute on chronic renal insufficiency with a peak creatinine 1.93 and she was mildly hyperkalemic.  He did have follow-up labs yesterday and creatinine is back down to baseline 1.67 and she was not hyperkalemic.  She does have some occasional oxygen.  She states she feels well overall.  She gets around in her house but I see that she had to go to the ER for hypertensive urgency.  She has clonidine at home to use as needed but she did not use it.  She does not really recall the details around that and I looked through the chart for this record.  They gave her some clonidine and her blood pressure came down.  She says typically her systolics are in the 130s.  She might get a little short of breath once in a while and put on her oxygen but this is very rare.  She denies any presyncope or syncope.  She has had no chest pain.  ROS: As stated in the HPI and negative for all other systems.  Studies Reviewed:    EKG:   NA  Risk Assessment/Calculations:             Physical Exam:   VS:  BP (!) 189/64   Pulse 67   Ht 5\' 4"  (1.626 m)   Wt 107 lb (48.5 kg)   SpO2 (!) 87%   BMI 18.37 kg/m    Wt Readings from Last 3 Encounters:  03/18/23 107 lb (48.5 kg)  03/11/23 106 lb (48.1 kg)   03/03/23 106 lb (48.1 kg)     GEN: Well nourished, well developed in no acute distress NECK: No JVD; No carotid bruits CARDIAC: RRR, 3 out of 6 apical systolic murmur mid-to-late peaking radiating at the aortic outflow tract, no diastolic murmurs, rubs, gallops RESPIRATORY:  Clear to auscultation without rales, wheezing or rhonchi  ABDOMEN: Soft, non-tender, non-distended EXTREMITIES:  No edema; No deformity   ASSESSMENT AND PLAN:   HFpEF:   She has not required any diuretic.  She is following fluid restriction.  She seems to be euvolemic.  No change in therapy.    Atrial tachycardia/PAF:   She had no sustained documented arrhythmias and none symptomatically.  No change in therapy.    HTN : Her blood pressure is not at target today and it was repeated.  However, she thinks it is very well-controlled at home.  I will ask her to keep a blood pressure diary.  We talked about as needed clonidine.  She might need to have this scheduled.     Thrombocytopenia/leukopenia :    This is followed by hematology.   Ao Valve stenosis and MR: This was  moderate AS in January 2023 .  I will repeat an echocardiogram.   CKD IIIB:     Creat 1.92 which is up from previous.  She is followed by nephrology.  For now she can continue on the Cozaar but she might have to discontinue this in the future.       Follow up me in six months.   Signed, Rollene Rotunda, MD

## 2023-03-18 ENCOUNTER — Encounter: Payer: Self-pay | Admitting: Cardiology

## 2023-03-18 ENCOUNTER — Ambulatory Visit: Payer: PPO | Admitting: Cardiology

## 2023-03-18 VITALS — BP 189/64 | HR 67 | Ht 64.0 in | Wt 107.0 lb

## 2023-03-18 DIAGNOSIS — I480 Paroxysmal atrial fibrillation: Secondary | ICD-10-CM | POA: Diagnosis not present

## 2023-03-18 DIAGNOSIS — I358 Other nonrheumatic aortic valve disorders: Secondary | ICD-10-CM | POA: Diagnosis not present

## 2023-03-18 DIAGNOSIS — I10 Essential (primary) hypertension: Secondary | ICD-10-CM | POA: Diagnosis not present

## 2023-03-18 DIAGNOSIS — I5032 Chronic diastolic (congestive) heart failure: Secondary | ICD-10-CM

## 2023-03-18 NOTE — Patient Instructions (Signed)
Medication Instructions:  The current medical regimen is effective;  continue present plan and medications.  *If you need a refill on your cardiac medications before your next appointment, please call your pharmacy*  Testing/Procedures: Your physician has requested that you have an echocardiogram. Echocardiography is a painless test that uses sound waves to create images of your heart. It provides your doctor with information about the size and shape of your heart and how well your heart's chambers and valves are working. This procedure takes approximately one hour. There are no restrictions for this procedure. Please do NOT wear cologne, perfume, aftershave, or lotions (deodorant is allowed). Please arrive 15 minutes prior to your appointment time. This will be completed at [facility].  You will be contacted to be scheduled.   Follow-Up: At [facility], you and your health needs are our priority.  As part of our continuing mission to provide you with exceptional heart care, we have created designated Provider Care Teams.  These Care Teams include your primary Cardiologist (physician) and Advanced Practice Providers (APPs -  Physician Assistants and Nurse Practitioners) who all work together to provide you with the care you need, when you need it.  We recommend signing up for the patient portal called "MyChart".  Sign up information is provided on this After Visit Summary.  MyChart is used to connect with patients for Virtual Visits (Telemedicine).  Patients are able to view lab/test results, encounter notes, upcoming appointments, etc.  Non-urgent messages can be sent to your provider as well.   To learn more about what you can do with MyChart, go to [email or web address].    Your next appointment:   6 month(s)  Provider:   Minus Breeding, MD

## 2023-03-19 ENCOUNTER — Other Ambulatory Visit: Payer: Self-pay | Admitting: Family Medicine

## 2023-03-24 ENCOUNTER — Ambulatory Visit (HOSPITAL_COMMUNITY): Admission: RE | Admit: 2023-03-24 | Payer: PPO | Source: Ambulatory Visit

## 2023-03-24 ENCOUNTER — Telehealth: Payer: Self-pay | Admitting: Cardiology

## 2023-03-24 ENCOUNTER — Ambulatory Visit (HOSPITAL_COMMUNITY)
Admission: RE | Admit: 2023-03-24 | Discharge: 2023-03-24 | Disposition: A | Discharge status: Home / Self Care | Payer: PPO | Source: Ambulatory Visit | Attending: Physician Assistant | Admitting: Physician Assistant

## 2023-03-24 DIAGNOSIS — Z170 Estrogen receptor positive status [ER+]: Secondary | ICD-10-CM | POA: Diagnosis not present

## 2023-03-24 DIAGNOSIS — C50511 Malignant neoplasm of lower-outer quadrant of right female breast: Secondary | ICD-10-CM

## 2023-03-24 DIAGNOSIS — R92333 Mammographic heterogeneous density, bilateral breasts: Secondary | ICD-10-CM | POA: Diagnosis not present

## 2023-03-24 NOTE — Telephone Encounter (Signed)
Checking percert on the following patient for testing scheduled at [facility].   ECHO 04/27/2023

## 2023-03-25 DIAGNOSIS — C44729 Squamous cell carcinoma of skin of left lower limb, including hip: Secondary | ICD-10-CM | POA: Diagnosis not present

## 2023-03-25 DIAGNOSIS — D0472 Carcinoma in situ of skin of left lower limb, including hip: Secondary | ICD-10-CM | POA: Diagnosis not present

## 2023-03-30 DIAGNOSIS — H40033 Anatomical narrow angle, bilateral: Secondary | ICD-10-CM | POA: Diagnosis not present

## 2023-03-30 DIAGNOSIS — H11823 Conjunctivochalasis, bilateral: Secondary | ICD-10-CM | POA: Diagnosis not present

## 2023-03-30 DIAGNOSIS — C44729 Squamous cell carcinoma of skin of left lower limb, including hip: Secondary | ICD-10-CM | POA: Diagnosis not present

## 2023-03-30 DIAGNOSIS — H43813 Vitreous degeneration, bilateral: Secondary | ICD-10-CM | POA: Diagnosis not present

## 2023-03-30 DIAGNOSIS — H2513 Age-related nuclear cataract, bilateral: Secondary | ICD-10-CM | POA: Diagnosis not present

## 2023-04-02 ENCOUNTER — Inpatient Hospital Stay (HOSPITAL_COMMUNITY)
Admission: EM | Admit: 2023-04-02 | Discharge: 2023-04-04 | DRG: 291 | Disposition: A | Discharge status: Home / Self Care | Payer: PPO | Attending: Internal Medicine | Admitting: Internal Medicine

## 2023-04-02 ENCOUNTER — Encounter (HOSPITAL_COMMUNITY): Payer: Self-pay | Admitting: Emergency Medicine

## 2023-04-02 ENCOUNTER — Other Ambulatory Visit: Payer: Self-pay

## 2023-04-02 ENCOUNTER — Emergency Department (HOSPITAL_COMMUNITY): Payer: PPO

## 2023-04-02 DIAGNOSIS — I5020 Unspecified systolic (congestive) heart failure: Principal | ICD-10-CM

## 2023-04-02 DIAGNOSIS — R079 Chest pain, unspecified: Secondary | ICD-10-CM | POA: Diagnosis not present

## 2023-04-02 DIAGNOSIS — D72819 Decreased white blood cell count, unspecified: Secondary | ICD-10-CM | POA: Diagnosis not present

## 2023-04-02 DIAGNOSIS — I4510 Unspecified right bundle-branch block: Secondary | ICD-10-CM | POA: Diagnosis present

## 2023-04-02 DIAGNOSIS — R001 Bradycardia, unspecified: Secondary | ICD-10-CM | POA: Diagnosis not present

## 2023-04-02 DIAGNOSIS — N183 Chronic kidney disease, stage 3 unspecified: Secondary | ICD-10-CM | POA: Diagnosis present

## 2023-04-02 DIAGNOSIS — Z8249 Family history of ischemic heart disease and other diseases of the circulatory system: Secondary | ICD-10-CM

## 2023-04-02 DIAGNOSIS — M8580 Other specified disorders of bone density and structure, unspecified site: Secondary | ICD-10-CM | POA: Diagnosis present

## 2023-04-02 DIAGNOSIS — Z79811 Long term (current) use of aromatase inhibitors: Secondary | ICD-10-CM | POA: Diagnosis not present

## 2023-04-02 DIAGNOSIS — I10 Essential (primary) hypertension: Secondary | ICD-10-CM | POA: Diagnosis not present

## 2023-04-02 DIAGNOSIS — Z79899 Other long term (current) drug therapy: Secondary | ICD-10-CM

## 2023-04-02 DIAGNOSIS — I130 Hypertensive heart and chronic kidney disease with heart failure and stage 1 through stage 4 chronic kidney disease, or unspecified chronic kidney disease: Principal | ICD-10-CM | POA: Diagnosis present

## 2023-04-02 DIAGNOSIS — Z823 Family history of stroke: Secondary | ICD-10-CM

## 2023-04-02 DIAGNOSIS — E871 Hypo-osmolality and hyponatremia: Secondary | ICD-10-CM | POA: Diagnosis not present

## 2023-04-02 DIAGNOSIS — I4719 Other supraventricular tachycardia: Secondary | ICD-10-CM | POA: Diagnosis present

## 2023-04-02 DIAGNOSIS — Z85828 Personal history of other malignant neoplasm of skin: Secondary | ICD-10-CM

## 2023-04-02 DIAGNOSIS — Z882 Allergy status to sulfonamides status: Secondary | ICD-10-CM

## 2023-04-02 DIAGNOSIS — D696 Thrombocytopenia, unspecified: Secondary | ICD-10-CM | POA: Diagnosis present

## 2023-04-02 DIAGNOSIS — I2089 Other forms of angina pectoris: Secondary | ICD-10-CM | POA: Diagnosis present

## 2023-04-02 DIAGNOSIS — N1831 Chronic kidney disease, stage 3a: Secondary | ICD-10-CM

## 2023-04-02 DIAGNOSIS — J9601 Acute respiratory failure with hypoxia: Secondary | ICD-10-CM | POA: Diagnosis present

## 2023-04-02 DIAGNOSIS — Z66 Do not resuscitate: Secondary | ICD-10-CM | POA: Diagnosis present

## 2023-04-02 DIAGNOSIS — I480 Paroxysmal atrial fibrillation: Secondary | ICD-10-CM | POA: Diagnosis not present

## 2023-04-02 DIAGNOSIS — I080 Rheumatic disorders of both mitral and aortic valves: Secondary | ICD-10-CM | POA: Diagnosis present

## 2023-04-02 DIAGNOSIS — E785 Hyperlipidemia, unspecified: Secondary | ICD-10-CM | POA: Diagnosis present

## 2023-04-02 DIAGNOSIS — E034 Atrophy of thyroid (acquired): Secondary | ICD-10-CM

## 2023-04-02 DIAGNOSIS — J9611 Chronic respiratory failure with hypoxia: Secondary | ICD-10-CM | POA: Diagnosis present

## 2023-04-02 DIAGNOSIS — Z8349 Family history of other endocrine, nutritional and metabolic diseases: Secondary | ICD-10-CM | POA: Diagnosis not present

## 2023-04-02 DIAGNOSIS — Z79890 Hormone replacement therapy (postmenopausal): Secondary | ICD-10-CM

## 2023-04-02 DIAGNOSIS — C50911 Malignant neoplasm of unspecified site of right female breast: Secondary | ICD-10-CM | POA: Diagnosis present

## 2023-04-02 DIAGNOSIS — I5033 Acute on chronic diastolic (congestive) heart failure: Secondary | ICD-10-CM | POA: Diagnosis present

## 2023-04-02 DIAGNOSIS — Z801 Family history of malignant neoplasm of trachea, bronchus and lung: Secondary | ICD-10-CM

## 2023-04-02 DIAGNOSIS — I272 Pulmonary hypertension, unspecified: Secondary | ICD-10-CM | POA: Diagnosis present

## 2023-04-02 DIAGNOSIS — E039 Hypothyroidism, unspecified: Secondary | ICD-10-CM | POA: Diagnosis not present

## 2023-04-02 DIAGNOSIS — I509 Heart failure, unspecified: Secondary | ICD-10-CM | POA: Diagnosis not present

## 2023-04-02 DIAGNOSIS — Z888 Allergy status to other drugs, medicaments and biological substances status: Secondary | ICD-10-CM

## 2023-04-02 DIAGNOSIS — R0602 Shortness of breath: Secondary | ICD-10-CM | POA: Diagnosis not present

## 2023-04-02 DIAGNOSIS — N1832 Chronic kidney disease, stage 3b: Secondary | ICD-10-CM | POA: Diagnosis not present

## 2023-04-02 DIAGNOSIS — J90 Pleural effusion, not elsewhere classified: Secondary | ICD-10-CM | POA: Diagnosis not present

## 2023-04-02 DIAGNOSIS — I5031 Acute diastolic (congestive) heart failure: Secondary | ICD-10-CM | POA: Diagnosis not present

## 2023-04-02 DIAGNOSIS — I110 Hypertensive heart disease with heart failure: Secondary | ICD-10-CM | POA: Diagnosis not present

## 2023-04-02 LAB — CBC
HCT: 27.9 % — ABNORMAL LOW (ref 36.0–46.0)
Hemoglobin: 8.6 g/dL — ABNORMAL LOW (ref 12.0–15.0)
MCH: 27.1 pg (ref 26.0–34.0)
MCHC: 30.8 g/dL (ref 30.0–36.0)
MCV: 88 fL (ref 80.0–100.0)
Platelets: 109 10*3/uL — ABNORMAL LOW (ref 150–400)
RBC: 3.17 MIL/uL — ABNORMAL LOW (ref 3.87–5.11)
RDW: 16.5 % — ABNORMAL HIGH (ref 11.5–15.5)
WBC: 3.6 10*3/uL — ABNORMAL LOW (ref 4.0–10.5)
nRBC: 0 % (ref 0.0–0.2)

## 2023-04-02 LAB — BASIC METABOLIC PANEL
Anion gap: 11 (ref 5–15)
BUN: 19 mg/dL (ref 8–23)
CO2: 27 mmol/L (ref 22–32)
Calcium: 8.8 mg/dL — ABNORMAL LOW (ref 8.9–10.3)
Chloride: 93 mmol/L — ABNORMAL LOW (ref 98–111)
Creatinine, Ser: 1.53 mg/dL — ABNORMAL HIGH (ref 0.44–1.00)
GFR, Estimated: 33 mL/min — ABNORMAL LOW (ref >60)
Glucose, Bld: 117 mg/dL — ABNORMAL HIGH (ref 70–99)
Potassium: 4.1 mmol/L (ref 3.5–5.1)
Sodium: 131 mmol/L — ABNORMAL LOW (ref 135–145)

## 2023-04-02 LAB — TROPONIN I (HIGH SENSITIVITY)
Troponin I (High Sensitivity): 7 ng/L (ref <18)
Troponin I (High Sensitivity): 8 ng/L (ref <18)

## 2023-04-02 LAB — BRAIN NATRIURETIC PEPTIDE: B Natriuretic Peptide: 560 pg/mL — ABNORMAL HIGH (ref 0.0–100.0)

## 2023-04-02 MED ORDER — LEVOTHYROXINE SODIUM 75 MCG PO TABS
75.0000 ug | ORAL_TABLET | Freq: Every day | ORAL | Status: DC
  Administered 2023-04-03 – 2023-04-04 (×2): 75 ug via ORAL
  Filled 2023-04-02 (×2): qty 1

## 2023-04-02 MED ORDER — ENSURE ENLIVE PO LIQD
237.0000 mL | Freq: Two times a day (BID) | ORAL | Status: DC
  Administered 2023-04-03 – 2023-04-04 (×3): 237 mL via ORAL

## 2023-04-02 MED ORDER — ONDANSETRON HCL 4 MG/2ML IJ SOLN: 4.0000 mg | Freq: Four times a day (QID) | INTRAMUSCULAR | Status: DC | PRN

## 2023-04-02 MED ORDER — MUPIROCIN 2 % EX OINT
TOPICAL_OINTMENT | Freq: Two times a day (BID) | CUTANEOUS | Status: DC
  Administered 2023-04-02: 1 via TOPICAL
  Filled 2023-04-02: qty 22

## 2023-04-02 MED ORDER — HYDRALAZINE HCL 25 MG PO TABS
100.0000 mg | ORAL_TABLET | Freq: Two times a day (BID) | ORAL | Status: DC
  Filled 2023-04-02 (×2): qty 4

## 2023-04-02 MED ORDER — PANTOPRAZOLE SODIUM 40 MG PO TBEC
40.0000 mg | DELAYED_RELEASE_TABLET | Freq: Every day | ORAL | Status: DC
  Administered 2023-04-02 – 2023-04-04 (×3): 40 mg via ORAL
  Filled 2023-04-02 (×3): qty 1

## 2023-04-02 MED ORDER — BISOPROLOL FUMARATE 5 MG PO TABS
5.0000 mg | ORAL_TABLET | Freq: Every day | ORAL | Status: DC
  Administered 2023-04-02 – 2023-04-04 (×3): 5 mg via ORAL
  Filled 2023-04-02 (×3): qty 1

## 2023-04-02 MED ORDER — SODIUM CHLORIDE 0.9% FLUSH
3.0000 mL | Freq: Two times a day (BID) | INTRAVENOUS | Status: DC
  Administered 2023-04-02 – 2023-04-04 (×4): 3 mL via INTRAVENOUS

## 2023-04-02 MED ORDER — LETROZOLE 2.5 MG PO TABS
2.5000 mg | ORAL_TABLET | Freq: Every day | ORAL | Status: DC
  Administered 2023-04-02 – 2023-04-04 (×3): 2.5 mg via ORAL
  Filled 2023-04-02 (×5): qty 1

## 2023-04-02 MED ORDER — LOSARTAN POTASSIUM 50 MG PO TABS
25.0000 mg | ORAL_TABLET | Freq: Every day | ORAL | Status: DC
  Administered 2023-04-03 – 2023-04-04 (×2): 25 mg via ORAL
  Filled 2023-04-02 (×3): qty 1

## 2023-04-02 MED ORDER — FLECAINIDE ACETATE 50 MG PO TABS
50.0000 mg | ORAL_TABLET | Freq: Two times a day (BID) | ORAL | Status: DC
  Administered 2023-04-02 – 2023-04-04 (×4): 50 mg via ORAL
  Filled 2023-04-02 (×4): qty 1

## 2023-04-02 MED ORDER — SODIUM CHLORIDE 0.9 % IV SOLN: 250.0000 mL | INTRAVENOUS | Status: DC | PRN

## 2023-04-02 MED ORDER — MUPIROCIN 2 % EX OINT: TOPICAL_OINTMENT | Freq: Two times a day (BID) | CUTANEOUS | Status: DC

## 2023-04-02 MED ORDER — MIRTAZAPINE 15 MG PO TABS
7.5000 mg | ORAL_TABLET | Freq: Every day | ORAL | Status: DC
  Administered 2023-04-02 – 2023-04-03 (×2): 7.5 mg via ORAL
  Filled 2023-04-02 (×2): qty 1

## 2023-04-02 MED ORDER — SODIUM CHLORIDE 0.9% FLUSH: 3.0000 mL | INTRAVENOUS | Status: DC | PRN

## 2023-04-02 MED ORDER — SILVER NITRATE-POT NITRATE 75-25 % EX MISC: 1.0000 | CUTANEOUS | Status: DC | PRN

## 2023-04-02 MED ORDER — VITAMIN D 25 MCG (1000 UNIT) PO TABS
1000.0000 [IU] | ORAL_TABLET | Freq: Every day | ORAL | Status: DC
  Administered 2023-04-02 – 2023-04-04 (×3): 1000 [IU] via ORAL
  Filled 2023-04-02 (×3): qty 1

## 2023-04-02 MED ORDER — ENOXAPARIN SODIUM 30 MG/0.3ML IJ SOSY
30.0000 mg | PREFILLED_SYRINGE | INTRAMUSCULAR | Status: DC
  Filled 2023-04-02: qty 0.3

## 2023-04-02 MED ORDER — ACETAMINOPHEN 325 MG PO TABS
650.0000 mg | ORAL_TABLET | ORAL | Status: DC | PRN
  Administered 2023-04-03: 650 mg via ORAL
  Filled 2023-04-02: qty 2

## 2023-04-02 MED ORDER — FERROUS SULFATE 325 (65 FE) MG PO TABS
325.0000 mg | ORAL_TABLET | Freq: Every day | ORAL | Status: DC
  Administered 2023-04-03 – 2023-04-04 (×2): 325 mg via ORAL
  Filled 2023-04-02 (×2): qty 1

## 2023-04-02 MED ORDER — FUROSEMIDE 10 MG/ML IJ SOLN
40.0000 mg | Freq: Every day | INTRAMUSCULAR | Status: DC
  Administered 2023-04-03 – 2023-04-04 (×2): 40 mg via INTRAVENOUS
  Filled 2023-04-02 (×2): qty 4

## 2023-04-02 MED ORDER — ATORVASTATIN CALCIUM 10 MG PO TABS
10.0000 mg | ORAL_TABLET | Freq: Every day | ORAL | Status: DC
  Administered 2023-04-02 – 2023-04-03 (×2): 10 mg via ORAL
  Filled 2023-04-02 (×2): qty 1

## 2023-04-02 MED ORDER — CLONIDINE HCL 0.1 MG PO TABS
0.1000 mg | ORAL_TABLET | Freq: Three times a day (TID) | ORAL | Status: DC | PRN
  Administered 2023-04-04: 0.1 mg via ORAL
  Filled 2023-04-02: qty 1

## 2023-04-02 MED ORDER — FUROSEMIDE 10 MG/ML IJ SOLN
40.0000 mg | Freq: Once | INTRAMUSCULAR | Status: AC
  Administered 2023-04-02: 40 mg via INTRAVENOUS
  Filled 2023-04-02: qty 4

## 2023-04-02 MED ORDER — HYDRALAZINE HCL 20 MG/ML IJ SOLN: 5.0000 mg | INTRAMUSCULAR | Status: DC | PRN

## 2023-04-02 MED ORDER — VITAMIN B-12 100 MCG PO TABS
500.0000 ug | ORAL_TABLET | Freq: Every day | ORAL | Status: DC
  Administered 2023-04-02 – 2023-04-04 (×3): 500 ug via ORAL
  Filled 2023-04-02 (×3): qty 5

## 2023-04-02 NOTE — ED Notes (Signed)
ED TO INPATIENT HANDOFF REPORT  ED Nurse Name and Phone #: Vernard Gambles, RN   S Name/Age/Gender [name] 89 y.o. female Room/Bed: APA18/APA18  Code Status   Code Status: Prior  Home/SNF/Other Home Patient oriented to: self, place, time, and situation Is this baseline? Yes   Triage Complete: Triage complete  Chief Complaint Acute on chronic diastolic CHF (congestive heart failure) (HCC) [I50.33]  Triage Note Pt presents with several days of CP with SOB, history of CHF.   Allergies Allergies  Allergen Reactions   Amlodipine Other (See Comments)    edema   Lisinopril     cough   Xeroform Occlusive Gauze Strip [Bismuth Tribromoph-Petrolatum]     Has to have no stick tape   Isosorbide Nitrate Nausea Only   Sulfonamide Derivatives Nausea And Vomiting and Rash   Terazosin Hives and Nausea Only    Level of Care/Admitting Diagnosis ED Disposition     ED Disposition  Admit   Condition  --   Comment  Hospital Area: [facility] [[id number]]  Level of Care: Telemetry [5]  Covid Evaluation: Asymptomatic - no recent exposure (last 10 days) testing not required  Diagnosis: Acute on chronic diastolic CHF (congestive heart failure) [facility]) [[id number]]  Admitting Physician: Chiquita Loth  Attending Physician: Randol Kern, DAWOOD S [[id number]]  Certification:: I certify this patient will need inpatient services for at least 2 midnights  Expected Medical Readiness: 04/04/2023          B Medical/Surgery History Past Medical History:  Diagnosis Date   Anemia    Arthritis    Asthma    Atrial fibrillation (HCC)    Basal cell carcinoma 02/06/2009   Left ear targus- ([facility])   Basal cell carcinoma 09/28/2008   Right back-(CX35FU)   CKD (chronic kidney disease), stage III (HCC)    Heart murmur    Hyperlipidemia    Hypertension    Hypothyroidism    Nodule of right lung    Right upper lobe   Osteopenia due to cancer therapy 10/07/2022   Pneumonia    PONV  (postoperative nausea and vomiting)    Renal insufficiency    Chronic   Renal vascular disease    Right renal artery stenosis (HCC) 05/09/2015   Squamous cell carcinoma of skin 09/07/2019   KA-Left shin-txpbx   Vertigo    Past Surgical History:  Procedure Laterality Date   BIOPSY  04/09/2021   Procedure: BIOPSY;  Surgeon: Dolores Frame, MD;  [facility];  Service: Gastroenterology;;   BREAST LUMPECTOMY WITH RADIOACTIVE SEED LOCALIZATION Right 04/08/2018   Procedure: BREAST LUMPECTOMY WITH RADIOACTIVE SEED LOCALIZATION;  Surgeon: Emelia Loron, MD;  Location: [facility] OR;  Service: General;  Laterality: Right;   COLONOSCOPY  2016   diverticulosis, hemorrhoids   ESOPHAGOGASTRODUODENOSCOPY  2016   normal   ESOPHAGOGASTRODUODENOSCOPY (EGD) WITH PROPOFOL N/A 04/09/2021   Procedure: ESOPHAGOGASTRODUODENOSCOPY (EGD) WITH PROPOFOL;  Surgeon: Dolores Frame, MD;  [facility];  Service: Gastroenterology;  Laterality: N/A;  12:45   ESOPHAGOGASTRODUODENOSCOPY (EGD) WITH PROPOFOL N/A 09/01/2021   Procedure: ESOPHAGOGASTRODUODENOSCOPY (EGD) WITH PROPOFOL;  Surgeon: Lanelle Bal, DO;  [facility];  Service: Endoscopy;  Laterality: N/A;   IR GENERIC HISTORICAL  08/29/2016   IR US GUIDE VASC ACCESS RIGHT 08/29/2016 [facility]   IR GENERIC HISTORICAL  08/29/2016   IR RENAL BILAT S&I MOD SED 08/29/2016 [facility]   KNEE ARTHROSCOPY Right    2019   THYROIDECTOMY, PARTIAL  Right 1981   middle lobe removed 1st, then right lobe removed 7-8 years later (approx. 1988)     A IV Location/Drains/Wounds Patient Lines/Drains/Airways Status     Active Line/Drains/Airways     Name Placement date Placement time Site Days   Peripheral IV 04/02/23 20 G 1" Left Antecubital 04/02/23  1646  Antecubital  less than 1            Intake/Output Last 24 hours No intake or output data in the 24 hours ending 04/02/23 1652  Labs/Imaging Results for  orders placed or performed during the hospital encounter of 04/02/23 (from the past 48 hour(s))  Basic metabolic panel     Status: Abnormal   Collection Time: 04/02/23  2:17 PM  Result Value Ref Range   Sodium 131 (L) 135 - 145 mmol/L   Potassium 4.1 3.5 - 5.1 mmol/L   Chloride 93 (L) 98 - 111 mmol/L   CO2 27 22 - 32 mmol/L   Glucose, Bld 117 (H) 70 - 99 mg/dL    Comment: Glucose reference range applies only to samples taken after fasting for at least 8 hours.   BUN 19 8 - 23 mg/dL   Creatinine, Ser 1.61 (H) 0.44 - 1.00 mg/dL   Calcium 8.8 (L) 8.9 - 10.3 mg/dL   GFR, Estimated 33 (L) >60 mL/min    Comment: (NOTE) Calculated using the CKD-EPI Creatinine Equation (2021)    Anion gap 11 5 - 15    Comment: Performed at [facility], [address] [place], [place] [address]  CBC     Status: Abnormal   Collection Time: 04/02/23  2:17 PM  Result Value Ref Range   WBC 3.6 (L) 4.0 - 10.5 K/uL   RBC 3.17 (L) 3.87 - 5.11 MIL/uL   Hemoglobin 8.6 (L) 12.0 - 15.0 g/dL   HCT 54.0 (L) 98.1 - 19.1 %   MCV 88.0 80.0 - 100.0 fL   MCH 27.1 26.0 - 34.0 pg   MCHC 30.8 30.0 - 36.0 g/dL   RDW 47.8 (H) 29.5 - 62.1 %   Platelets 109 (L) 150 - 400 K/uL    Comment: SPECIMEN CHECKED FOR CLOTS Immature Platelet Fraction may be clinically indicated, consider ordering this additional test [id number] REPEATED TO VERIFY    nRBC 0.0 0.0 - 0.2 %    Comment: Performed at [facility], [address], [place], [place] [address]  Troponin I (High Sensitivity)     Status: None   Collection Time: 04/02/23  2:17 PM  Result Value Ref Range   Troponin I (High Sensitivity) 7 <18 ng/L    Comment: (NOTE) Elevated high sensitivity troponin I (hsTnI) values and significant  changes across serial measurements may suggest ACS but many other  chronic and acute conditions are known to elevate hsTnI results.  Refer to the "Links" section for chest pain algorithms and additional  guidance. Performed at [facility],  [address], [place], [place] [address]   Brain natriuretic peptide     Status: Abnormal   Collection Time: 04/02/23  2:17 PM  Result Value Ref Range   B Natriuretic Peptide 560.0 (H) 0.0 - 100.0 pg/mL    Comment: Performed at [facility], [address], [place], [place] [address]  Troponin I (High Sensitivity)     Status: None   Collection Time: 04/02/23  4:04 PM  Result Value Ref Range   Troponin I (High Sensitivity) 8 <18 ng/L    Comment: (NOTE) Elevated high sensitivity  troponin I (hsTnI) values and significant  changes across serial measurements may suggest ACS but many other  chronic and acute conditions are known to elevate hsTnI results.  Refer to the "Links" section for chest pain algorithms and additional  guidance. Performed at [facility], [address] [place], [place] [address]    DG Chest 2 View  Result Date: 04/02/2023 CLINICAL DATA:  Chest pain. Shortness of breath for a few days. History of CHF EXAM: CHEST - 2 VIEW COMPARISON:  X-ray 09/20/2022 FINDINGS: Hyperinflation. Enlarged cardiopericardial silhouette. Mild interstitial changes. Possible component of edema. Tiny pleural effusions are seen, right-greater-than-left. No pneumothorax. Calcified aorta IMPRESSION: Enlarged heart. New tiny effusions. Interstitial changes seen, possible mild edema. No consolidation Recommend follow up Electronically Signed   By: Karen Kays M.D.   On: 04/02/2023 15:58    Pending Labs Unresulted Labs (From admission, onward)    None       Vitals/Pain Today's Vitals   04/02/23 1347 04/02/23 1348 04/02/23 1539 04/02/23 1600  BP:  (!) 187/64  (!) 173/65  Pulse:  76  67  Resp:  (!) 26  (!) 22  Temp:  98.6 F (37 C)    TempSrc:  Oral    SpO2:  (!) 84% 95% 96%  Weight: 49 kg     Height: 5\' 4"  (1.626 m)     PainSc:        Isolation Precautions No active isolations  Medications Medications  furosemide (LASIX) injection 40 mg (40 mg Intravenous Given 04/02/23 1646)     Mobility walks     Focused Assessments Cardiac Assessment Handoff:    Lab Results  Component Value Date   CKTOTAL 264 (H) 11/28/2007   CKMB 3.5 11/28/2007   TROPONINI <0.03 08/20/2018   Lab Results  Component Value Date   DDIMER 2.26 (H) 06/16/2022   Does the Patient currently have chest pain? No    R Recommendations: See Admitting Provider Note  Report given to:   Additional Notes: SOB with exertion, pt has needed to use her home oxygen more often in the last two weeks. BNP 560.

## 2023-04-02 NOTE — H&P (Signed)
TRH H&P   Patient Demographics:    [name], is a 89 y.o. female  MRN: [id number]   DOB - 1933-11-19  Admit Date - 04/02/2023  Outpatient Primary MD for the patient is [name], DO  Referring MD/NP/PA: Dr Estell Harpin  Outpatient Specialists: cardiology Dr Claudie Fisherman  Patient coming from: home  Chief Complaint  Patient presents with   Shortness of Breath   Chest Pain      HPI:    [name]  is a 89 y.o. female,  with history of breast cancer, paroxysmal atrial fibrillation, asthma, CKD stage III, hypertension, hyperlipidemia, hypothyroidism, chronic diastolic CHF, squamous cell cancer status post recent excision in anterior area left lower extremity, followed by dermatology Dr. Margo Aye . -Patient presents today secondary to complaints of shortness of breath, reports it has been progressive over last 2 weeks, she does report some mild orthopnea as well, but it is mainly exertional, as well she does endorse chest pain, midsternal, with activity, relieved with rest, reports generalized weakness, fatigue as well, she denies any hemoptysis, coffee-ground emesis, or lower extremity edema, patient reports she is following daily at her dermatologic office for wound dressing change at the area of recent squamous cell cancer resection and anterior leg left lower extremity patient reports she is not on Lasix at home at baseline, reports she has oxygen at her house, but she never needed it, reports her pulse ox has been documenting low oxygen saturation in the lower to mid 80s with activity. -In ED patient was noted to be hypoxic in mid 80s at rest on room air, and in the 70s with activity, much more comfortable on 2 L oxygen, BNP elevated at 560, troponins negative x 2, EKG nonacute, low at 131, creatinine at 1.53 (baseline 1.9), chest x-ray with evidence of volume overload and vascular  congestion, patient received 1 dose of IV Lasix and [facility] hospitalist consulted to admit.   Review of systems:     A full 10 point Review of Systems was done, except as stated above, all other Review of Systems were negative.   With Past History of the following :    Past Medical History:  Diagnosis Date   Anemia    Arthritis    Asthma    Atrial fibrillation (HCC)    Basal cell carcinoma 02/06/2009   Left ear targus- ([facility]HS)   Basal cell carcinoma 09/28/2008   Right back-(CX35FU)   CKD (chronic kidney disease), stage III (HCC)    Heart murmur    Hyperlipidemia    Hypertension    Hypothyroidism    Nodule of right lung    Right upper lobe   Osteopenia due to cancer therapy 10/07/2022   Pneumonia    PONV (postoperative nausea and vomiting)    Renal insufficiency    Chronic   Renal vascular disease    Right renal artery stenosis (  HCC) 05/09/2015   Squamous cell carcinoma of skin 09/07/2019   KA-Left shin-txpbx   Vertigo       Past Surgical History:  Procedure Laterality Date   BIOPSY  04/09/2021   Procedure: BIOPSY;  Surgeon: Dolores Frame, MD;  [facility];  Service: Gastroenterology;;   BREAST LUMPECTOMY WITH RADIOACTIVE SEED LOCALIZATION Right 04/08/2018   Procedure: BREAST LUMPECTOMY WITH RADIOACTIVE SEED LOCALIZATION;  Surgeon: Emelia Loron, MD;  Location: [facility] OR;  Service: General;  Laterality: Right;   COLONOSCOPY  2016   diverticulosis, hemorrhoids   ESOPHAGOGASTRODUODENOSCOPY  2016   normal   ESOPHAGOGASTRODUODENOSCOPY (EGD) WITH PROPOFOL N/A 04/09/2021   Procedure: ESOPHAGOGASTRODUODENOSCOPY (EGD) WITH PROPOFOL;  Surgeon: Dolores Frame, MD;  [facility];  Service: Gastroenterology;  Laterality: N/A;  12:45   ESOPHAGOGASTRODUODENOSCOPY (EGD) WITH PROPOFOL N/A 09/01/2021   Procedure: ESOPHAGOGASTRODUODENOSCOPY (EGD) WITH PROPOFOL;  Surgeon: Lanelle Bal, DO;  [facility];  Service: Endoscopy;   Laterality: N/A;   IR GENERIC HISTORICAL  08/29/2016   IR US GUIDE VASC ACCESS RIGHT 08/29/2016 [facility]   IR GENERIC HISTORICAL  08/29/2016   IR RENAL BILAT S&I MOD SED 08/29/2016 [facility]   KNEE ARTHROSCOPY Right    2019   THYROIDECTOMY, PARTIAL Right 1981   middle lobe removed 1st, then right lobe removed 7-8 years later (approx. 54)      Social History:     Social History   Tobacco Use   Smoking status: Never   Smokeless tobacco: Never  Substance Use Topics   Alcohol use: No        Family History :     Family History  Problem Relation Age of Onset   Thyroid disease Mother    Stroke Father    Hypertension Brother    Lung cancer Maternal Uncle    Cancer Maternal Grandmother        Liver   Lung cancer Maternal Uncle    Hypertension Daughter    Hypercholesterolemia Daughter    Hypercholesterolemia Daughter       Home Medications:   Prior to Admission medications   Medication Sig Start Date End Date Taking? Authorizing Provider  acetaminophen (TYLENOL) 325 MG tablet Take 325 mg by mouth daily as needed for moderate pain or headache.   Yes [provider]  atorvastatin (LIPITOR) 20 MG tablet TAKE 1/2 TABLET BY MOUTH DAILY Patient taking differently: Take 10 mg by mouth at bedtime. 03/19/23  Yes Gottschalk, Ashly M, DO  bisoprolol (ZEBETA) 5 MG tablet TAKE 1 TABLET (5 MG TOTAL) BY MOUTH DAILY. 08/08/22  Yes Delynn Flavin M, DO  cholecalciferol (VITAMIN D) 1000 units tablet Take 1,000 Units by mouth daily.   Yes [provider]  cloNIDine (CATAPRES) 0.1 MG tablet Take 1 tablet (0.1 mg total) by mouth as directed. May take one tablet as needed with systolic blood pressure greater than 170 09/10/22  Yes Hochrein, Fayrene Fearing, MD  cyanocobalamin (VITAMIN B12) 500 MCG tablet Take 1 tablet (500 mcg total) by mouth daily. 10/07/22  Yes Pennington, Rebekah M, PA-C  feeding supplement (ENSURE ENLIVE / ENSURE PLUS) LIQD Take 237 mLs by mouth 2 (two)  times daily between meals. Patient taking differently: Take 237 mLs by mouth daily. 08/28/22  Yes Tat, Onalee Hua, MD  ferrous sulfate 325 (65 FE) MG tablet Take 325 mg by mouth daily with breakfast.   Yes [provider]  flecainide (TAMBOCOR) 50 MG tablet TAKE 1 TABLET BY MOUTH TWICE  A DAY 09/26/22  Yes Rollene Rotunda, MD  hydrALAZINE (APRESOLINE) 100 MG tablet Take 100 mg by mouth 2 (two) times daily.   Yes [provider]  letrozole (FEMARA) 2.5 MG tablet TAKE 1 TABLET BY MOUTH EVERY DAY 01/22/23  Yes Doreatha Massed, MD  levothyroxine (SYNTHROID) 75 MCG tablet TAKE 1 TABLET BY MOUTH EVERY DAY 01/09/23  Yes Gottschalk, Ashly M, DO  losartan (COZAAR) 25 MG tablet Take 25 mg by mouth daily.   Yes [provider]  mirtazapine (REMERON) 7.5 MG tablet TAKE 1 TABLET BY MOUTH EVERYDAY AT BEDTIME 02/27/23  Yes Harper, Kristen S, PA-C  Multiple Vitamin (MULTIVITAMIN) tablet Take 1 tablet by mouth daily.   Yes [provider]  mupirocin ointment (BACTROBAN) 2 % Apply 1 Application topically 2 (two) times daily. 03/25/23  Yes [provider]  pantoprazole (PROTONIX) 40 MG tablet TAKE 1 TABLET (40 MG TOTAL) BY MOUTH TWICE A DAY BEFORE MEALS Patient taking differently: Take 40 mg by mouth daily. 01/16/23  Yes Letta Median, PA-C     Allergies:     Allergies  Allergen Reactions   Amlodipine Other (See Comments)    edema   Lisinopril     cough   Xeroform Occlusive Gauze Strip [Bismuth Tribromoph-Petrolatum]     Has to have no stick tape   Isosorbide Nitrate Nausea Only   Sulfonamide Derivatives Nausea And Vomiting and Rash   Terazosin Hives and Nausea Only     Physical Exam:   Vitals  Blood pressure (!) 192/64, pulse 70, temperature 98.3 F (36.8 C), temperature source Oral, resp. rate 20, height 5\' 4"  (1.626 m), weight 49 kg, SpO2 98%.   1. General Frail, elderly female, lying in bed in NAD,  2. Normal affect and insight, Not Suicidal or  Homicidal, Awake Alert, Oriented X 3.  3. No F.N deficits, ALL C.Nerves Intact, Strength 5/5 all 4 extremities, Sensation intact all 4 extremities, Plantars down going.  4. Ears and Eyes appear Normal, Conjunctivae clear, PERRLA. Moist Oral Mucosa.  5. Supple Neck, elevated JVD, No cervical lymphadenopathy appriciated, No Carotid Bruits.  6. Symmetrical Chest wall movement, Good air movement bilaterally, crackles at bilateral lung bases  7. RRR, No Gallops, Rubs or Murmurs, No Parasternal Heave.  8. Positive Bowel Sounds, Abdomen Soft, No tenderness, No organomegaly appriciated,No rebound -guarding or rigidity.  9.  No Cyanosis, Normal Skin Turgor, right lower extremity skin changes, left lower extremity anterior shin wound, please see picture below  10. Good muscle tone,  joints appear normal , no effusions, Normal ROM.      Data Review:    CBC Recent Labs  Lab 04/02/23 1417  WBC 3.6*  HGB 8.6*  HCT 27.9*  PLT 109*  MCV 88.0  MCH 27.1  MCHC 30.8  RDW 16.5*   ------------------------------------------------------------------------------------------------------------------  Chemistries  Recent Labs  Lab 04/02/23 1417  NA 131*  K 4.1  CL 93*  CO2 27  GLUCOSE 117*  BUN 19  CREATININE 1.53*  CALCIUM 8.8*   ------------------------------------------------------------------------------------------------------------------ estimated creatinine clearance is 20 mL/min (A) (by C-G formula based on SCr of 1.53 mg/dL (H)). ------------------------------------------------------------------------------------------------------------------ No results for input(s): "TSH", "T4TOTAL", "T3FREE", "THYROIDAB" in the last 72 hours.  Invalid input(s): "FREET3"  Coagulation profile No results for input(s): "INR", "PROTIME" in the last 168 hours. ------------------------------------------------------------------------------------------------------------------- No results for  input(s): "DDIMER" in the last 72 hours. -------------------------------------------------------------------------------------------------------------------  Cardiac Enzymes No results for input(s): "CKMB", "TROPONINI", "MYOGLOBIN" in the last 168 hours.  Invalid  input(s): "CK" ------------------------------------------------------------------------------------------------------------------    Component Value Date/Time   BNP 560.0 (H) 04/02/2023 1417     ---------------------------------------------------------------------------------------------------------------  Urinalysis    Component Value Date/Time   COLORURINE AMBER (A) 08/26/2022 2205   APPEARANCEUR HAZY (A) 08/26/2022 2205   APPEARANCEUR Clear 10/11/2019 1326   LABSPEC 1.021 08/26/2022 2205   PHURINE 5.0 08/26/2022 2205   GLUCOSEU NEGATIVE 08/26/2022 2205   HGBUR SMALL (A) 08/26/2022 2205   BILIRUBINUR NEGATIVE 08/26/2022 2205   BILIRUBINUR Negative 10/11/2019 1326   KETONESUR NEGATIVE 08/26/2022 2205   PROTEINUR 100 (A) 08/26/2022 2205   UROBILINOGEN 0.2 11/27/2007 1051   NITRITE NEGATIVE 08/26/2022 2205   LEUKOCYTESUR NEGATIVE 08/26/2022 2205    ----------------------------------------------------------------------------------------------------------------   Imaging Results:    DG Chest 2 View  Result Date: 04/02/2023 CLINICAL DATA:  Chest pain. Shortness of breath for a few days. History of CHF EXAM: CHEST - 2 VIEW COMPARISON:  X-ray 09/20/2022 FINDINGS: Hyperinflation. Enlarged cardiopericardial silhouette. Mild interstitial changes. Possible component of edema. Tiny pleural effusions are seen, right-greater-than-left. No pneumothorax. Calcified aorta IMPRESSION: Enlarged heart. New tiny effusions. Interstitial changes seen, possible mild edema. No consolidation Recommend follow up Electronically Signed   By: Karen Kays M.D.   On: 04/02/2023 15:58     EKG:  Vent. rate 75 BPM PR interval 198 ms QRS duration  94 ms QT/QTcB 436/486 ms P-R-T axes 68 -39 94 Normal sinus rhythm Possible Left atrial enlargement Left axis deviation Incomplete right bundle branch block  Assessment & Plan:    Principal Problem:   Acute on chronic diastolic CHF (congestive heart failure) (HCC) Active Problems:   CKD (chronic kidney disease) stage 3, GFR 30-59 ml/min (HCC)   Hypothyroidism   Essential hypertension, malignant   Acute respiratory failure with hypoxia (HCC)  Acute respiratory failure with hypoxia Acute on chronic diastolic CHF Chest pain -She presents with new dyspnea (she is desaturating to 70% upon going to the restroom while in ED, currently on 2 L oxygen -Evidence of volume overload, has JVD, elevated BNP and evidence of mild pleural effusion and vascular congestion on chest x-ray -08/29/21 Echo EF 65-70%, G2DD -Manage under CHF pathway, is not on any diuretics at home, continue with IV Lasix 40 mg daily, continue with daily weights, strict ins and out -will repeat 2D echo, already was scheduled for next month by her primary cardiologist -She does report stable angina, findings reassuring 7>> 8 -Will request cardiology consult for further recommendation  History of atrial tachycardia, paroxysmal A-fib -Continue with home medication flecainide and bisoprolol -She is not on any anticoagulation, will defer management to neurology  Hypertension -blood pressure is elevated in ED, while patient reports this usually controlled in the morning, mildly elevated in the afternoon, will continue with her home medication including as needed clonidine, will add as needed hydralazine as well  thrombocytopenia/leukopenia-this is chronic, at baseline, she has been followed by hematology  History of aortic valve stenosis and MR -Plan was to repeat echo next September, will repeat during this hospital admission  CKD stage IIIb -Baseline 1.9, it is 1.5 most likely due to volume overload  Hyponatremia -Due to  volume overload, should improve with diuresis  Lower extremity anterior shin squamous skin cancer -This is status post resection by dermatology Dr. Margo Aye, continue with wound care   Hypothyroidism -continue synthroid   Paroxysmal atrial fibrillation (HCC) - Continue flecainide and bisoprolol for rate control -Patient is not on anticoagulation -remained in sinus   Essential hypertension - Continue with  hydralazine, bisoprolol, losartan, -On as needed clonidine, will add as needed hydralazine   Hyperlipidemia - Continue statin   Right Breast Cancer -continue letrozole      DVT Prophylaxis  Lovenox   AM Labs Ordered, also please review Full Orders  Family Communication: Admission, patients condition and plan of care including tests being ordered have been discussed with the patient and Daughter at bedside  who indicate understanding and agree with the plan and Code Status.  Code Status DNR  Likely DC to  home  Condition GUARDED    Consults called: none, cards requested in EPIC    Admission status: observation    Time spent in minutes : 70 minutes   Huey Bienenstock M.D on 04/02/2023 at 6:33 PM   [facility] - Office  [phone]

## 2023-04-02 NOTE — ED Triage Notes (Signed)
Pt presents with several days of CP with SOB, history of CHF.

## 2023-04-02 NOTE — ED Notes (Signed)
Dr. Criss Alvine reviewed current EKG with previous EKG and indicated pt is not having NSTEMI.

## 2023-04-02 NOTE — ED Notes (Signed)
Pt ambulated to restroom and sats dropped to 77. Pt back on 2L and sats now 94%. MD aware.

## 2023-04-02 NOTE — ED Provider Triage Note (Signed)
Emergency Medicine Provider Triage Evaluation Note  [name] , a 89 y.o. female  was evaluated in triage.  Pt complains of patient having short of breath and chest pain today, no cough, no fever.  Review of Systems  Positive: Sob, chest pain Negative: cough  Physical Exam  BP (!) 187/64 (BP Location: Left Arm)   Pulse 76   Temp 98.6 F (37 C) (Oral)   Resp (!) 26   Ht 5\' 4"  (1.626 m)   Wt 49 kg   SpO2 (!) 84%   BMI 18.54 kg/m  Gen:   Awake, no distress   Resp:  Normal effort  MSK:   Moves extremities without difficulty  Other:    Medical Decision Making  Medically screening exam initiated at 3:15 PM.  Appropriate orders placed.  [name] was informed that the remainder of the evaluation will be completed by another provider, this initial triage assessment does not replace that evaluation, and the importance of remaining in the ED until their evaluation is complete.     Ma Rings, [place] 04/02/23 1515

## 2023-04-03 ENCOUNTER — Other Ambulatory Visit: Payer: PPO

## 2023-04-03 ENCOUNTER — Inpatient Hospital Stay (HOSPITAL_COMMUNITY): Payer: PPO

## 2023-04-03 DIAGNOSIS — I5033 Acute on chronic diastolic (congestive) heart failure: Secondary | ICD-10-CM | POA: Diagnosis not present

## 2023-04-03 DIAGNOSIS — I5031 Acute diastolic (congestive) heart failure: Secondary | ICD-10-CM

## 2023-04-03 DIAGNOSIS — J9601 Acute respiratory failure with hypoxia: Secondary | ICD-10-CM | POA: Diagnosis not present

## 2023-04-03 LAB — CBC
HCT: 24.9 % — ABNORMAL LOW (ref 36.0–46.0)
Hemoglobin: 7.6 g/dL — ABNORMAL LOW (ref 12.0–15.0)
MCH: 27 pg (ref 26.0–34.0)
MCHC: 30.5 g/dL (ref 30.0–36.0)
MCV: 88.3 fL (ref 80.0–100.0)
Platelets: 85 10*3/uL — ABNORMAL LOW (ref 150–400)
RBC: 2.82 MIL/uL — ABNORMAL LOW (ref 3.87–5.11)
RDW: 16.1 % — ABNORMAL HIGH (ref 11.5–15.5)
WBC: 2.6 10*3/uL — ABNORMAL LOW (ref 4.0–10.5)
nRBC: 0 % (ref 0.0–0.2)

## 2023-04-03 LAB — ECHOCARDIOGRAM COMPLETE
AR max vel: 0.9 cm2
AV Area VTI: 0.93 cm2
AV Area mean vel: 0.88 cm2
AV Mean grad: 34 mmHg
AV Peak grad: 48.5 mmHg
Ao pk vel: 3.48 m/s
Area-P 1/2: 3.12 cm2
Height: 64 in
MV M vel: 3.59 m/s
MV Peak grad: 51.6 mmHg
S' Lateral: 2.8 cm
Weight: 1707.24 [oz_av]

## 2023-04-03 LAB — BASIC METABOLIC PANEL
Anion gap: 7 (ref 5–15)
BUN: 24 mg/dL — ABNORMAL HIGH (ref 8–23)
CO2: 31 mmol/L (ref 22–32)
Calcium: 8.4 mg/dL — ABNORMAL LOW (ref 8.9–10.3)
Chloride: 95 mmol/L — ABNORMAL LOW (ref 98–111)
Creatinine, Ser: 1.84 mg/dL — ABNORMAL HIGH (ref 0.44–1.00)
GFR, Estimated: 26 mL/min — ABNORMAL LOW (ref >60)
Glucose, Bld: 100 mg/dL — ABNORMAL HIGH (ref 70–99)
Potassium: 3.9 mmol/L (ref 3.5–5.1)
Sodium: 133 mmol/L — ABNORMAL LOW (ref 135–145)

## 2023-04-03 MED ORDER — HYDRALAZINE HCL 25 MG PO TABS
50.0000 mg | ORAL_TABLET | Freq: Two times a day (BID) | ORAL | Status: DC
  Administered 2023-04-03 – 2023-04-04 (×2): 50 mg via ORAL
  Filled 2023-04-03 (×2): qty 2

## 2023-04-03 NOTE — Consult Note (Signed)
Cardiology Consultation   Patient ID: [name] MRN: [id number]; DOB: 1933/08/23  Admit date: 04/02/2023 Date of Consult: 04/03/2023  PCP:  [name], DO   [facility] Providers Cardiologist:  Rollene Rotunda, MD        Patient Profile:   [name] is a 89 y.o. female with a hx of  chronic HFpEF, atach, PAF, HTN, moderate AS, CKD IIIB who is being seen 04/03/2023 for the evaluation of chest pain at the request of Dr Frederick Peers.  History of Present Illness:   [name] 89 yo female history of chronic HFpEF, atach, PAF, HTN, moderate AS, CKD IIIB presented with SOB and chest pain. In ER found to be hypoxic to the 80s with signs of fluid overload. Admitted to medicine team for acute on chronic HFpEF. Intermittent chest tightness at home, progressing DOE.     K 4.1 Cr 1.53 BUN 19 WBC 3.6 Hgb 8.6 Plt 109 BNP 560  Trop 7-->8 EKG SR, chronic lateral ST/T changes CXR cardiomegaly, possible mild edema 03/2023 echo: LVEF 60-65%, grade II dd, mod MS mean grad 6, mod to severe AS mean grad 34, AVA VTI 0.93 DI 0.41   Jan 2023 echo: LVEF 65-70%, no WMAs, grade II dd, normal RV, PASP 44, mod BAE, mild MR, mod AS mean grade 28, AVA VTI 1.14 DI 0.50     Past Medical History:  Diagnosis Date   Anemia    Arthritis    Asthma    Atrial fibrillation (HCC)    Basal cell carcinoma 02/06/2009   Left ear targus- (MOHS)   Basal cell carcinoma 09/28/2008   Right back-([id number])   CKD (chronic kidney disease), stage III (HCC)    Heart murmur    Hyperlipidemia    Hypertension    Hypothyroidism    Nodule of right lung    Right upper lobe   Osteopenia due to cancer therapy 10/07/2022   Pneumonia    PONV (postoperative nausea and vomiting)    Renal insufficiency    Chronic   Renal vascular disease    Right renal artery stenosis (HCC) 05/09/2015   Squamous cell carcinoma of skin 09/07/2019   KA-Left shin-txpbx   Vertigo     Past Surgical History:  Procedure  Laterality Date   BIOPSY  04/09/2021   Procedure: BIOPSY;  Surgeon: Dolores Frame, MD;  [facility];  Service: Gastroenterology;;   BREAST LUMPECTOMY WITH RADIOACTIVE SEED LOCALIZATION Right 04/08/2018   Procedure: BREAST LUMPECTOMY WITH RADIOACTIVE SEED LOCALIZATION;  Surgeon: Emelia Loron, MD;  Location: [facility] OR;  Service: General;  Laterality: Right;   COLONOSCOPY  2016   diverticulosis, hemorrhoids   ESOPHAGOGASTRODUODENOSCOPY  2016   normal   ESOPHAGOGASTRODUODENOSCOPY (EGD) WITH PROPOFOL N/A 04/09/2021   Procedure: ESOPHAGOGASTRODUODENOSCOPY (EGD) WITH PROPOFOL;  Surgeon: Dolores Frame, MD;  [facility];  Service: Gastroenterology;  Laterality: N/A;  12:45   ESOPHAGOGASTRODUODENOSCOPY (EGD) WITH PROPOFOL N/A 09/01/2021   Procedure: ESOPHAGOGASTRODUODENOSCOPY (EGD) WITH PROPOFOL;  Surgeon: Lanelle Bal, DO;  [facility];  Service: Endoscopy;  Laterality: N/A;   IR GENERIC HISTORICAL  08/29/2016   IR US GUIDE VASC ACCESS RIGHT 08/29/2016 [facility]   IR GENERIC HISTORICAL  08/29/2016   IR RENAL BILAT S&I MOD SED 08/29/2016 [facility]   KNEE ARTHROSCOPY Right    2019   THYROIDECTOMY, PARTIAL Right 1981   middle lobe removed 1st, then right lobe removed 7-8 years later (approx. 1988)  Inpatient Medications: Scheduled Meds:  atorvastatin  10 mg Oral QHS   bisoprolol  5 mg Oral Daily   cholecalciferol  1,000 Units Oral Daily   enoxaparin (LOVENOX) injection  30 mg Subcutaneous Q24H   feeding supplement  237 mL Oral BID BM   ferrous sulfate  325 mg Oral Q breakfast   flecainide  50 mg Oral BID   furosemide  40 mg Intravenous Daily   hydrALAZINE  100 mg Oral BID   letrozole  2.5 mg Oral Daily   levothyroxine  75 mcg Oral Daily   losartan  25 mg Oral Daily   mirtazapine  7.5 mg Oral QHS   mupirocin ointment   Topical BID   pantoprazole  40 mg Oral Daily   sodium chloride flush  3 mL Intravenous Q12H    cyanocobalamin  500 mcg Oral Daily   Continuous Infusions:  sodium chloride     PRN Meds: sodium chloride, acetaminophen, cloNIDine, hydrALAZINE, ondansetron (ZOFRAN) IV, silver nitrate applicators, sodium chloride flush  Allergies:    Allergies  Allergen Reactions   Amlodipine Other (See Comments)    edema   Lisinopril     cough   Xeroform Occlusive Gauze Strip [Bismuth Tribromoph-Petrolatum]     Has to have no stick tape   Isosorbide Nitrate Nausea Only   Sulfonamide Derivatives Nausea And Vomiting and Rash   Terazosin Hives and Nausea Only    Social History:   Social History   Socioeconomic History   Marital status: Widowed    Spouse name: Not on file   Number of children: 2   Years of education: 63   Highest education level: Not on file  Occupational History   Occupation: Retired  Tobacco Use   Smoking status: Never   Smokeless tobacco: Never  Vaping Use   Vaping status: Never Used  Substance and Sexual Activity   Alcohol use: No   Drug use: No   Sexual activity: Not Currently  Other Topics Concern   Not on file  Social History Narrative   Lives alone, but her daughter stays there at night   Ambidextrous (writes with left hand).   No caffeine use.   Social Determinants of Health   Financial Resource Strain: Low Risk  (12/15/2022)   Overall Financial Resource Strain (CARDIA)    Difficulty of Paying Living Expenses: Not hard at all  Food Insecurity: No Food Insecurity (04/02/2023)   Hunger Vital Sign    Worried About Running Out of Food in the Last Year: Never true    Ran Out of Food in the Last Year: Never true  Transportation Needs: No Transportation Needs (04/02/2023)   PRAPARE - Administrator, Civil Service (Medical): No    Lack of Transportation (Non-Medical): No  Physical Activity: Inactive (12/15/2022)   Exercise Vital Sign    Days of Exercise per Week: 0 days    Minutes of Exercise per Session: 0 min  Stress: No Stress Concern  Present (12/15/2022)   [employer] of Occupational Health - Occupational Stress Questionnaire    Feeling of Stress : Not at all  Social Connections: Moderately Isolated (12/15/2022)   Social Connection and Isolation Panel [NHANES]    Frequency of Communication with Friends and Family: More than three times a week    Frequency of Social Gatherings with Friends and Family: Three times a week    Attends Religious Services: More than 4 times per year    Active Member of Clubs or  Organizations: No    Attends Banker Meetings: Never    Marital Status: Widowed  Intimate Partner Violence: Not At Risk (04/02/2023)   Humiliation, Afraid, Rape, and Kick questionnaire    Fear of Current or Ex-Partner: No    Emotionally Abused: No    Physically Abused: No    Sexually Abused: No    Family History:    Family History  Problem Relation Age of Onset   Thyroid disease Mother    Stroke Father    Hypertension Brother    Lung cancer Maternal Uncle    Cancer Maternal Grandmother        Liver   Lung cancer Maternal Uncle    Hypertension Daughter    Hypercholesterolemia Daughter    Hypercholesterolemia Daughter      ROS:  Please see the history of present illness.   All other ROS reviewed and negative.     Physical Exam/Data:   Vitals:   04/02/23 2120 04/03/23 0001 04/03/23 0445 04/03/23 0500  BP: (!) 134/40 (!) 127/46 (!) 160/63   Pulse:   (!) 55   Resp:   18   Temp:   97.7 F (36.5 C)   TempSrc:      SpO2:   98%   Weight:    48.4 kg  Height:        Intake/Output Summary (Last 24 hours) at 04/03/2023 1314 Last data filed at 04/03/2023 0827 Gross per 24 hour  Intake 480 ml  Output 1340 ml  Net -860 ml      04/03/2023    5:00 AM 04/02/2023    1:47 PM 03/18/2023    9:37 AM  Last 3 Weights  Weight (lbs) 106 lb 11.2 oz 108 lb 107 lb  Weight (kg) 48.4 kg 48.988 kg 48.535 kg     Body mass index is 18.32 kg/m.  General:  Well nourished, well developed, in no acute  distress HEENT: normal Neck: + JVD Vascular: No carotid bruits; Distal pulses 2+ bilaterally Cardiac:  normal S1, S2; RRR; 3/6 systolic murmur rusb Lungs:  clear to auscultation bilaterally, no wheezing, rhonchi or rales  Abd: soft, nontender, no hepatomegaly  Ext: no edema Musculoskeletal:  No deformities, BUE and BLE strength normal and equal Skin: warm and dry  Neuro:  CNs 2-12 intact, no focal abnormalities noted Psych:  Normal affect     Laboratory Data:  High Sensitivity Troponin:   Recent Labs  Lab 04/02/23 1417 04/02/23 1604  TROPONINIHS 7 8     Chemistry Recent Labs  Lab 04/02/23 1417 04/03/23 0420  NA 131* 133*  K 4.1 3.9  CL 93* 95*  CO2 27 31  GLUCOSE 117* 100*  BUN 19 24*  CREATININE 1.53* 1.84*  CALCIUM 8.8* 8.4*  GFRNONAA 33* 26*  ANIONGAP 11 7    No results for input(s): "PROT", "ALBUMIN", "AST", "ALT", "ALKPHOS", "BILITOT" in the last 168 hours. Lipids No results for input(s): "CHOL", "TRIG", "HDL", "LABVLDL", "LDLCALC", "CHOLHDL" in the last 168 hours.  Hematology Recent Labs  Lab 04/02/23 1417 04/03/23 0420  WBC 3.6* 2.6*  RBC 3.17* 2.82*  HGB 8.6* 7.6*  HCT 27.9* 24.9*  MCV 88.0 88.3  MCH 27.1 27.0  MCHC 30.8 30.5  RDW 16.5* 16.1*  PLT 109* 85*   Thyroid No results for input(s): "TSH", "FREET4" in the last 168 hours.  BNP Recent Labs  Lab 04/02/23 1417  BNP 560.0*    DDimer No results for input(s): "DDIMER" in the last 168  hours.   Radiology/Studies:  DG Chest 2 View  Result Date: 04/02/2023 CLINICAL DATA:  Chest pain. Shortness of breath for a few days. History of CHF EXAM: CHEST - 2 VIEW COMPARISON:  X-ray 09/20/2022 FINDINGS: Hyperinflation. Enlarged cardiopericardial silhouette. Mild interstitial changes. Possible component of edema. Tiny pleural effusions are seen, right-greater-than-left. No pneumothorax. Calcified aorta IMPRESSION: Enlarged heart. New tiny effusions. Interstitial changes seen, possible mild edema. No  consolidation Recommend follow up Electronically Signed   By: Karen Kays M.D.   On: 04/02/2023 15:58     Assessment and Plan:   Acute on chronic HFpEF - 03/2023 echo: LVEF 60-65%, grade II dd, mod MS mean grad 6, mod to severe AS mean grad 34, AVA VTI 0.93 DI 0.41 - CXR possible edema, BNP 560. Reds vest 39% today - limited I/Os thus far, negative 1.1 L. Received IV lasix 40mg  x 1 yesterday, due for 40mg  this AM. Cr up from 1.5 to 1.8. From review ranges has been 1.5 to 1.8 most often closer to 1.5. Will have to accept higher Cr for now to diurese her - GFR 26, would avoid SGLT2i   2. Aortic stenosis - mod to severe AS mean grad 34, AVA VTI 0.93 DI 0.41 - monitor at this time, would repeat echo 6 months    3.PAF/atach - from Dr Jenene Slicker notes primarily has had atach, has not been on anticoagulation. ALso history of pancytopenia with chronic anemia and thrombocytopenia.  - has been on flecanide, bisoprolol  4. Pulmonary HTN - severe by echo this admit, certaintly large group II compnent with HFpEF and current volume overload. PASP just last year was mildly elevated. Would f/u echo and PASP as outpatient once more euvolemic.   5. HTN - wide pulse pressure, high SBP and lower DBP likely age related. Common pattern for her based on clinic bps - continue current meds, lowered hydral to 50mg  bid and follow bp;s    For questions or updates, please contact [facility] Please consult [email or web address] for contact info under    Signed, Dina Rich, MD  04/03/2023 1:14 PM

## 2023-04-03 NOTE — TOC Initial Note (Signed)
Transition of Care [facility]) - Initial/Assessment Note    Patient Details  Name: [name] MRN: [id number] Date of Birth: [birth year]  Transition of Care [facility]) CM/SW Contact:    Villa Herb, LCSWA Phone Number: 04/03/2023, 11:35 AM  Clinical Narrative:                 Pt is high risk for readmission. TOC consulted for CHF screen. CSW met with pt at bedside to complete assessment. Pt lives alone but her daughter stays with her at night. Pt is independent in completing her ADLs and able to drive to appointments. Pt has not had HH. Pt has been wearing O2 at home.   Pt states that she does normally weigh herself daily. Pt takes medications as prescribed. Pt states she does not always follow a heart healthy diet. TOC to follow.   Expected Discharge Plan: Home/Self Care Barriers to Discharge: Continued Medical Work up   Patient Goals and CMS Choice Patient states their goals for this hospitalization and ongoing recovery are:: return home CMS Medicare.gov Compare Post Acute Care list provided to:: Patient        Expected Discharge Plan and Services In-house Referral: Clinical Social Work Discharge Planning Services: CM Consult   Living arrangements for the past 2 months: Single Family Home                                      Prior Living Arrangements/Services Living arrangements for the past 2 months: Single Family Home Lives with:: Self Patient language and need for interpreter reviewed:: Yes Do you feel safe going back to the place where you live?: Yes      Need for Family Participation in Patient Care: Yes (Comment) Care giver support system in place?: Yes (comment) Current home services: DME Criminal Activity/Legal Involvement Pertinent to Current Situation/Hospitalization: No - Comment as needed  Activities of Daily Living Home Assistive Devices/Equipment: None ADL Screening (condition at time of admission) Patient's cognitive ability adequate to safely  complete daily activities?: Yes Is the patient deaf or have difficulty hearing?: No Does the patient have difficulty seeing, even when wearing glasses/contacts?: No Does the patient have difficulty concentrating, remembering, or making decisions?: No Patient able to express need for assistance with ADLs?: Yes Does the patient have difficulty dressing or bathing?: No Independently performs ADLs?: Yes (appropriate for developmental age) Does the patient have difficulty walking or climbing stairs?: No Weakness of Legs: None Weakness of Arms/Hands: None  Permission Sought/Granted                  Emotional Assessment Appearance:: Appears stated age Attitude/Demeanor/Rapport: Engaged Affect (typically observed): Accepting Orientation: : Oriented to Self, Oriented to Place, Oriented to  Time, Oriented to Situation Alcohol / Substance Use: Not Applicable Psych Involvement: No (comment)  Admission diagnosis:  Acute on chronic diastolic CHF (congestive heart failure) (HCC) [I50.33] Systolic congestive heart failure, unspecified HF chronicity (HCC) [I50.20] Patient Active Problem List   Diagnosis Date Noted   Acute on chronic diastolic CHF (congestive heart failure) (HCC) 04/02/2023   Cellulitis of left lower extremity 01/27/2023   Cellulitis of right lower extremity 01/27/2023   Osteopenia due to cancer therapy 10/07/2022   Epigastric burning sensation 09/18/2022   Heart failure due to valvular disease (HCC) 09/07/2022   RSV (respiratory syncytial virus pneumonia) 08/27/2022   Acute on chronic heart failure with preserved ejection fraction (  HCC) 08/24/2022   GERD (gastroesophageal reflux disease) 06/17/2022   Elevated d-dimer 06/17/2022   Anemia due to chronic blood loss 10/01/2021   Duodenal ulcer 10/01/2021   Loss of weight    Early satiety    Symptomatic anemia 08/31/2021   Acute-on-chronic kidney injury (HCC) 08/31/2021   Dry cough 08/31/2021   UGI bleed 08/31/2021    Failure to thrive in adult 08/31/2021   Mitral valve insufficiency 08/20/2021   History of right breast cancer    Malnutrition of moderate degree 08/03/2021   Acute respiratory failure with hypoxia (HCC) 08/01/2021   Acute bronchitis 08/01/2021   Decreased appetite 07/25/2021   Protein-calorie malnutrition (HCC) 07/25/2021   Skin tear of right lower leg without complication 05/14/2021   Non-intractable vomiting 04/08/2021   Edema 02/22/2021   Constipation 01/23/2021   Osteoarthritis of knee 12/13/2020   History of arthroscopy of knee 12/13/2020   Pain in joint of left knee 12/13/2020   Atrial tachycardia 07/26/2019   Aortic valve sclerosis 07/26/2019   Aortic stenosis, moderate 12/01/2018   Breast cancer, right (HCC) 04/08/2018   Malignant neoplasm of lower-outer quadrant of right breast of female, estrogen receptor positive (HCC) 02/25/2018   Tear of lateral meniscus of knee 10/27/2017   Blood blister 11/27/2016   Vertigo 09/09/2016   Renal vascular disease 10/31/2015   Nodule of right lung 10/31/2015   Asthma without status asthmaticus 10/31/2015   Essential hypertension, malignant    Hypothyroidism 10/15/2015   Pancytopenia (HCC) 10/15/2015   Hyponatremia 10/14/2015   Thrombocytopenia (HCC) 06/16/2015   Right renal artery stenosis (HCC) 05/09/2015   Chronic diastolic heart failure (HCC) 04/03/2014   CKD (chronic kidney disease) stage 3, GFR 30-59 ml/min (HCC) 04/02/2014   Paroxysmal atrial fibrillation (HCC) 01/25/2014   Normocytic anemia 07/19/2013   Bilateral lower extremity edema 06/30/2013   Hypertension with fluid overload 06/30/2013   Varicose veins of lower extremities with other complications 05/03/2013   ABNORMAL STRESS ELECTROCARDIOGRAM 01/23/2010   Hyperlipidemia 12/18/2009   Essential hypertension 12/18/2009   RENAL INSUFFICIENCY, CHRONIC 12/18/2009   Arthropathy 12/18/2009   PCP:  [name], DO Pharmacy:   [facility] [phone] - [place], [place] - [address] [place] [address] Phone: [phone] Fax: [phone]     Social Determinants of Health (SDOH) Social History: SDOH Screenings   Food Insecurity: No Food Insecurity (04/02/2023)  Housing: Low Risk  (04/02/2023)  Transportation Needs: No Transportation Needs (04/02/2023)  Utilities: Not At Risk (04/02/2023)  Alcohol Screen: Low Risk  (12/15/2022)  Depression (PHQ2-9): Low Risk  (03/11/2023)  Financial Resource Strain: Low Risk  (12/15/2022)  Physical Activity: Inactive (12/15/2022)  Social Connections: Moderately Isolated (12/15/2022)  Stress: No Stress Concern Present (12/15/2022)  Tobacco Use: Low Risk  (04/02/2023)   SDOH Interventions:     Readmission Risk Interventions    04/03/2023   11:33 AM 08/27/2022   11:36 AM 09/01/2021    3:43 PM  Readmission Risk Prevention Plan  Transportation Screening Complete Complete Complete  PCP or Specialist Appt within 3-5 Days   Complete  HRI or Home Care Consult Complete  Complete  Social Work Consult for Recovery Care Planning/Counseling Complete  Complete  Palliative Care Screening Not Applicable  Not Applicable  Medication Review Oceanographer) Complete Complete Complete  HRI or Home Care Consult  Complete   SW Recovery Care/Counseling Consult  Complete   Palliative Care Screening  Not Applicable   Skilled Nursing Facility  Not Applicable

## 2023-04-03 NOTE — Progress Notes (Signed)
Patient alert and verbal, ambulated with stand by assist in room. Patient ambulated with PT during shift reported oxygen saturation dropped to the low 80s while ambulating, patient placed back on oxygen. Patient verbalized no complaints of shortness of breath while on oxygen in room.

## 2023-04-03 NOTE — Progress Notes (Signed)
Progress Note    [name]   GNF:[id number]  DOB: 18-Oct-1933  DOA: 04/02/2023     1 PCP: [name], DO  Initial CC: SOB  Hospital Course: Ms. Umbarger is an 89 yo female with PMH HFpEF, PAF, HTN, moderate AS, CKD3b, basal cell carcinoma, HLD, hypothyroidism, osteopenia, right renal artery stenosis, arthritis who presented with shortness of breath that was progressively worsening over approximately 2 weeks.  She noted becoming more short of breath with mild exertion including walking. She has recently undergone excision of squamous cell involving left leg and has been doing dressing changes at home. On workup in the ER she was noted to be hypoxic in the mid 80s on room air.  BNP was noted to be elevated.  Troponins were negative x 2.  EKG was negative for signs of ischemia. She was started on IV Lasix and admitted for further workup.  Cardiology was consulted given concern for volume overload and worsening cardiac function.  Interval History:  Seen this morning resting in bed.  Oxygen in place and confirms short of breath at home with minimal exertion.  Denies any chest pain at this time.  Assessment and Plan:  Acute respiratory failure with hypoxia Acute on chronic diastolic CHF Chest pain -She presented with new dyspnea (she is desaturating to 70% upon going to the restroom while in ED, currently on 2 L oxygen) -Evidence of volume overload, has JVD, elevated BNP and evidence of mild pleural effusion and vascular congestion on chest x-ray -08/29/21 Echo EF 65-70%, G2DD -Not on diuretics at baseline.  Recently seen by cardiology on 03/18/2023 - started on Lasix on admission.  Cardiology consulted, follow-up further diuretic recommendations   History of atrial tachycardia, paroxysmal A-fib -Continue with home medication flecainide and bisoprolol -She is not on any anticoagulation   Hypertension -Above goal on admission - Further adjustment to regimen to be determined    Thrombocytopenia/leukopenia-this is chronic, at baseline, she has been followed by hematology   History of aortic valve stenosis and MR -Plan was to repeat echo in September, will repeat during this hospital admission - follow up echo results    CKD stage IIIb -Baseline appears 1.6 - 1.8 - currently pretty much baseline -Will monitor renal function on diuresis   Hyponatremia -Due to volume overload, should improve with diuresis   Lower extremity anterior shin squamous skin cancer -This is status post resection by dermatology Dr. Margo Aye, continue with wound care   Hypothyroidism -continue synthroid   Hyperlipidemia - Continue statin   Right Breast Cancer -continue letrozole   Old records reviewed in assessment of this patient  Antimicrobials:   DVT prophylaxis:  enoxaparin (LOVENOX) injection 30 mg Start: 04/03/23 2200   Code Status:   Code Status: DNR  Mobility Assessment (Last 72 Hours)     Mobility Assessment     Row Name 04/03/23 1304 04/03/23 0827 04/02/23 2024 04/02/23 1900     Does patient have an order for bedrest or is patient medically unstable -- No - Continue assessment No - Continue assessment No - Continue assessment    What is the highest level of mobility based on the progressive mobility assessment? Level 5 (Walks with assist in room/hall) - Balance while stepping forward/back and can walk in room with assist - Complete Level 4 (Walks with assist in room) - Balance while marching in place and cannot step forward and back - Complete Level 4 (Walks with assist in room) - Balance while marching in  place and cannot step forward and back - Complete Level 6 (Walks independently in room and hall) - Balance while walking in room without assist - Complete             Barriers to discharge: none Disposition Plan:  Home Status is: Inpt  Objective: Blood pressure (!) 160/51, pulse (!) 58, temperature 98.4 F (36.9 C), temperature source Oral, resp. rate  14, height 5\' 4"  (1.626 m), weight 48.4 kg, SpO2 99%.  Examination:  Physical Exam Constitutional:      Appearance: Normal appearance.  HENT:     Head: Normocephalic and atraumatic.     Mouth/Throat:     Mouth: Mucous membranes are moist.  Eyes:     Extraocular Movements: Extraocular movements intact.  Cardiovascular:     Rate and Rhythm: Normal rate and regular rhythm.  Pulmonary:     Effort: Pulmonary effort is normal. No respiratory distress.     Breath sounds: Normal breath sounds.  Abdominal:     General: Bowel sounds are normal. There is no distension.     Palpations: Abdomen is soft.     Tenderness: There is no abdominal tenderness.  Musculoskeletal:        General: Normal range of motion.     Cervical back: Normal range of motion and neck supple.  Skin:    General: Skin is warm and dry.  Neurological:     General: No focal deficit present.     Mental Status: She is alert.  Psychiatric:        Mood and Affect: Mood normal.      Consultants:  Cardiology  Procedures:    Data Reviewed: Results for orders placed or performed during the hospital encounter of 04/02/23 (from the past 24 hour(s))  Troponin I (High Sensitivity)     Status: None   Collection Time: 04/02/23  4:04 PM  Result Value Ref Range   Troponin I (High Sensitivity) 8 <18 ng/L  Basic metabolic panel     Status: Abnormal   Collection Time: 04/03/23  4:20 AM  Result Value Ref Range   Sodium 133 (L) 135 - 145 mmol/L   Potassium 3.9 3.5 - 5.1 mmol/L   Chloride 95 (L) 98 - 111 mmol/L   CO2 31 22 - 32 mmol/L   Glucose, Bld 100 (H) 70 - 99 mg/dL   BUN 24 (H) 8 - 23 mg/dL   Creatinine, Ser 1.61 (H) 0.44 - 1.00 mg/dL   Calcium 8.4 (L) 8.9 - 10.3 mg/dL   GFR, Estimated 26 (L) >60 mL/min   Anion gap 7 5 - 15  CBC     Status: Abnormal   Collection Time: 04/03/23  4:20 AM  Result Value Ref Range   WBC 2.6 (L) 4.0 - 10.5 K/uL   RBC 2.82 (L) 3.87 - 5.11 MIL/uL   Hemoglobin 7.6 (L) 12.0 - 15.0 g/dL    HCT 09.6 (L) 04.5 - 46.0 %   MCV 88.3 80.0 - 100.0 fL   MCH 27.0 26.0 - 34.0 pg   MCHC 30.5 30.0 - 36.0 g/dL   RDW 40.9 (H) 81.1 - 91.4 %   Platelets 85 (L) 150 - 400 K/uL   nRBC 0.0 0.0 - 0.2 %    I have reviewed pertinent nursing notes, vitals, labs, and images as necessary. I have ordered labwork to follow up on as indicated.  I have reviewed the last notes from staff over past 24 hours. I have discussed patient's care plan  and test results with nursing staff, CM/SW, and other staff as appropriate.  Time spent: Greater than 50% of the 55 minute visit was spent in counseling/coordination of care for the patient as laid out in the A&P.   LOS: 1 day   Lewie Chamber, MD [facility] 04/03/2023, 2:38 PM

## 2023-04-03 NOTE — Progress Notes (Signed)
Wound care provided per order to left lower leg.

## 2023-04-03 NOTE — Hospital Course (Signed)
[name] is an 89 yo female with PMH HFpEF, PAF, HTN, moderate AS, CKD3b, basal cell carcinoma, HLD, hypothyroidism, osteopenia, right renal artery stenosis, arthritis who presented with shortness of breath that was progressively worsening over approximately 2 weeks.  She noted becoming more short of breath with mild exertion including walking. She has recently undergone excision of squamous cell involving left leg and has been doing dressing changes at home. On workup in the ER she was noted to be hypoxic in the mid 80s on room air.  BNP was noted to be elevated.  Troponins were negative x 2.  EKG was negative for signs of ischemia. She was started on IV Lasix and admitted for further workup.  Cardiology was consulted given concern for volume overload and worsening cardiac function. She diuresed well with IV Lasix and respiratory symptoms improved.  She was continued on oral Lasix at discharge with outpatient follow-up with cardiology scheduled.

## 2023-04-03 NOTE — Consult Note (Signed)
WOC Nurse Consult Note: Reason for Consult:chronic, nonhealing wound to left LE pretibial wound, full thickness Consult is performed remotely after review of EMR including photographs. Wound type: trauma vs infectious Pressure Injury POA: N/A Measurement:Per nursing flow sheet, 2.5cm round Wound bed: red/yellow, stained with silver nitrate (gray/white) Drainage (amount, consistency, odor) moderate serous Periwound:intact, mild erythema Dressing procedure/placement/frequency:I have provided nursing with daily care orders for while in house using a soap and water cleanse, rinse and dry followed by application of a silver hydrofiber dressing topped with dry gauze and secured with silicone foam. A sacral silicone foam is to be placed for PI prevention, and heels are to be floated while in bed.  WOC nursing team will not follow, but will remain available to this patient, the nursing and medical teams.  Please re-consult if needed.  Thank you for inviting [place] to participate in this patient's Plan of Care.  Ladona Mow, MSN, RN, CNS, GNP, Leda Min, [employer], [employer] phone:  [phone]

## 2023-04-03 NOTE — Progress Notes (Signed)
   04/03/23 0800  ReDS Vest / Clip  Station Marker A  Ruler Value 23  ReDS Value Range 36 - 40  ReDS Actual Value 39

## 2023-04-03 NOTE — Care Management Important Message (Signed)
Important Message  Patient Details  Name: [name] MRN: [id number] Date of Birth: Mar 08, 1934   Medicare Important Message Given:  Yes     Corey Harold 04/03/2023, 11:27 AM

## 2023-04-03 NOTE — ED Provider Notes (Signed)
[facility] MEDICAL SURGICAL UNIT Provider Note   CSN: [id number] Arrival date & time: 04/02/23  1335     History  Chief Complaint  Patient presents with   Shortness of Breath   Chest Pain    [name] is a 89 y.o. female.  Patient complains of shortness of breath.  This shortness of breath has been getting worse over the last 2 weeks  The history is provided by the patient and medical records. No language interpreter was used.  Shortness of Breath Severity:  Moderate Onset quality:  Sudden Timing:  Constant Progression:  Unable to specify Chronicity:  New Context: activity   Relieved by:  Nothing Worsened by:  Nothing Ineffective treatments:  None tried Associated symptoms: chest pain   Associated symptoms: no abdominal pain, no cough, no headaches and no rash   Chest Pain Associated symptoms: shortness of breath   Associated symptoms: no abdominal pain, no back pain, no cough, no fatigue and no headache        Home Medications Prior to Admission medications   Medication Sig Start Date End Date Taking? Authorizing Provider  acetaminophen (TYLENOL) 325 MG tablet Take 325 mg by mouth daily as needed for moderate pain or headache.   Yes [provider]  atorvastatin (LIPITOR) 20 MG tablet TAKE 1/2 TABLET BY MOUTH DAILY Patient taking differently: Take 10 mg by mouth at bedtime. 03/19/23  Yes Gottschalk, Ashly M, DO  bisoprolol (ZEBETA) 5 MG tablet TAKE 1 TABLET (5 MG TOTAL) BY MOUTH DAILY. 08/08/22  Yes Delynn Flavin M, DO  cholecalciferol (VITAMIN D) 1000 units tablet Take 1,000 Units by mouth daily.   Yes [provider]  cloNIDine (CATAPRES) 0.1 MG tablet Take 1 tablet (0.1 mg total) by mouth as directed. May take one tablet as needed with systolic blood pressure greater than 170 09/10/22  Yes Hochrein, Fayrene Fearing, MD  cyanocobalamin (VITAMIN B12) 500 MCG tablet Take 1 tablet (500 mcg total) by mouth daily. 10/07/22  Yes Pennington, Rebekah M, PA-C   feeding supplement (ENSURE ENLIVE / ENSURE PLUS) LIQD Take 237 mLs by mouth 2 (two) times daily between meals. Patient taking differently: Take 237 mLs by mouth daily. 08/28/22  Yes Tat, Onalee Hua, MD  ferrous sulfate 325 (65 FE) MG tablet Take 325 mg by mouth daily with breakfast.   Yes [provider]  flecainide (TAMBOCOR) 50 MG tablet TAKE 1 TABLET BY MOUTH TWICE A DAY 09/26/22  Yes Rollene Rotunda, MD  hydrALAZINE (APRESOLINE) 100 MG tablet Take 100 mg by mouth 2 (two) times daily.   Yes [provider]  letrozole (FEMARA) 2.5 MG tablet TAKE 1 TABLET BY MOUTH EVERY DAY 01/22/23  Yes Doreatha Massed, MD  levothyroxine (SYNTHROID) 75 MCG tablet TAKE 1 TABLET BY MOUTH EVERY DAY 01/09/23  Yes Gottschalk, Ashly M, DO  losartan (COZAAR) 25 MG tablet Take 25 mg by mouth daily.   Yes [provider]  mirtazapine (REMERON) 7.5 MG tablet TAKE 1 TABLET BY MOUTH EVERYDAY AT BEDTIME 02/27/23  Yes Harper, Kristen S, PA-C  Multiple Vitamin (MULTIVITAMIN) tablet Take 1 tablet by mouth daily.   Yes [provider]  mupirocin ointment (BACTROBAN) 2 % Apply 1 Application topically 2 (two) times daily. 03/25/23  Yes [provider]  pantoprazole (PROTONIX) 40 MG tablet TAKE 1 TABLET (40 MG TOTAL) BY MOUTH TWICE A DAY BEFORE MEALS Patient taking differently: Take 40 mg by mouth daily. 01/16/23  Yes Letta Median, PA-C  Allergies    Amlodipine, Lisinopril, Xeroform occlusive gauze strip [bismuth tribromoph-petrolatum], Isosorbide nitrate, Sulfonamide derivatives, and Terazosin    Review of Systems   Review of Systems  Constitutional:  Negative for appetite change and fatigue.  HENT:  Negative for congestion, ear discharge and sinus pressure.   Eyes:  Negative for discharge.  Respiratory:  Positive for shortness of breath. Negative for cough.   Cardiovascular:  Positive for chest pain.  Gastrointestinal:  Negative for abdominal pain and diarrhea.  Genitourinary:   Negative for frequency and hematuria.  Musculoskeletal:  Negative for back pain.  Skin:  Negative for rash.  Neurological:  Negative for seizures and headaches.  Psychiatric/Behavioral:  Negative for hallucinations.     Physical Exam Updated Vital Signs BP (!) 160/63   Pulse (!) 55   Temp 97.7 F (36.5 C)   Resp 18   Ht 5\' 4"  (1.626 m)   Wt 48.4 kg   SpO2 98%   BMI 18.32 kg/m  Physical Exam Vitals and nursing note reviewed.  Constitutional:      Appearance: She is well-developed.  HENT:     Head: Normocephalic.     Nose: Nose normal.  Eyes:     General: No scleral icterus.    Conjunctiva/sclera: Conjunctivae normal.  Neck:     Thyroid: No thyromegaly.  Cardiovascular:     Rate and Rhythm: Normal rate and regular rhythm.     Heart sounds: No murmur heard.    No friction rub. No gallop.  Pulmonary:     Breath sounds: No stridor. No wheezing or rales.  Chest:     Chest wall: No tenderness.  Abdominal:     General: There is no distension.     Tenderness: There is no abdominal tenderness. There is no rebound.  Musculoskeletal:        General: Normal range of motion.     Cervical back: Neck supple.  Lymphadenopathy:     Cervical: No cervical adenopathy.  Skin:    Findings: No erythema or rash.  Neurological:     Mental Status: She is alert and oriented to person, place, and time.     Motor: No abnormal muscle tone.     Coordination: Coordination normal.  Psychiatric:        Behavior: Behavior normal.     ED Results / Procedures / Treatments   Labs (all labs ordered are listed, but only abnormal results are displayed) Labs Reviewed  BASIC METABOLIC PANEL - Abnormal; Notable for the following components:      Result Value   Sodium 131 (*)    Chloride 93 (*)    Glucose, Bld 117 (*)    Creatinine, Ser 1.53 (*)    Calcium 8.8 (*)    GFR, Estimated 33 (*)    All other components within normal limits  CBC - Abnormal; Notable for the following components:    WBC 3.6 (*)    RBC 3.17 (*)    Hemoglobin 8.6 (*)    HCT 27.9 (*)    RDW 16.5 (*)    Platelets 109 (*)    All other components within normal limits  BRAIN NATRIURETIC PEPTIDE - Abnormal; Notable for the following components:   B Natriuretic Peptide 560.0 (*)    All other components within normal limits  BASIC METABOLIC PANEL - Abnormal; Notable for the following components:   Sodium 133 (*)    Chloride 95 (*)    Glucose, Bld 100 (*)  BUN 24 (*)    Creatinine, Ser 1.84 (*)    Calcium 8.4 (*)    GFR, Estimated 26 (*)    All other components within normal limits  CBC - Abnormal; Notable for the following components:   WBC 2.6 (*)    RBC 2.82 (*)    Hemoglobin 7.6 (*)    HCT 24.9 (*)    RDW 16.1 (*)    Platelets 85 (*)    All other components within normal limits  TROPONIN I (HIGH SENSITIVITY)  TROPONIN I (HIGH SENSITIVITY)    EKG EKG Interpretation Date/Time:  Thursday April 02 2023 13:53:15 EDT Ventricular Rate:  75 PR Interval:  198 QRS Duration:  94 QT Interval:  436 QTC Calculation: 486 R Axis:   -39  Text Interpretation: Normal sinus rhythm Possible Left atrial enlargement Left axis deviation Incomplete right bundle branch block Minimal voltage criteria for LVH, may be normal variant ( Cornell product )  ST changes similar to Feb 2024 Confirmed by Pricilla Loveless [phone]) on 04/02/2023 2:16:04 PM  Radiology DG Chest 2 View  Result Date: 04/02/2023 CLINICAL DATA:  Chest pain. Shortness of breath for a few days. History of CHF EXAM: CHEST - 2 VIEW COMPARISON:  X-ray 09/20/2022 FINDINGS: Hyperinflation. Enlarged cardiopericardial silhouette. Mild interstitial changes. Possible component of edema. Tiny pleural effusions are seen, right-greater-than-left. No pneumothorax. Calcified aorta IMPRESSION: Enlarged heart. New tiny effusions. Interstitial changes seen, possible mild edema. No consolidation Recommend follow up Electronically Signed   By: Karen Kays M.D.   On:  04/02/2023 15:58    Procedures Procedures    Medications Ordered in ED Medications  silver nitrate applicators applicator 1 Stick (has no administration in time range)  mupirocin ointment (BACTROBAN) 2 % (1 Application Topical Given 04/02/23 2121)  letrozole [facility]) tablet 2.5 mg (2.5 mg Oral Given 04/02/23 2325)  atorvastatin (LIPITOR) tablet 10 mg (10 mg Oral Given 04/02/23 2103)  bisoprolol (ZEBETA) tablet 5 mg (5 mg Oral Given 04/02/23 2118)  cloNIDine (CATAPRES) tablet 0.1 mg (has no administration in time range)  flecainide (TAMBOCOR) tablet 50 mg (50 mg Oral Given 04/03/23 0826)  hydrALAZINE (APRESOLINE) tablet 100 mg (100 mg Oral Not Given 04/02/23 2120)  losartan (COZAAR) tablet 25 mg (has no administration in time range)  levothyroxine (SYNTHROID) tablet 75 mcg (75 mcg Oral Given 04/03/23 0542)  mirtazapine (REMERON) tablet 7.5 mg (7.5 mg Oral Given 04/02/23 2103)  pantoprazole (PROTONIX) EC tablet 40 mg (40 mg Oral Given 04/03/23 0826)  vitamin B-12 (CYANOCOBALAMIN) tablet 500 mcg (500 mcg Oral Given 04/03/23 0825)  ferrous sulfate tablet 325 mg (325 mg Oral Given 04/03/23 0826)  cholecalciferol (VITAMIN D3) 25 MCG (1000 UNIT) tablet 1,000 Units (1,000 Units Oral Given 04/03/23 0826)  feeding supplement (ENSURE ENLIVE / ENSURE PLUS) liquid 237 mL (has no administration in time range)  sodium chloride flush (NS) 0.9 % injection 3 mL (3 mLs Intravenous Given 04/02/23 2122)  sodium chloride flush (NS) 0.9 % injection 3 mL (has no administration in time range)  0.9 %  sodium chloride infusion (has no administration in time range)  acetaminophen (TYLENOL) tablet 650 mg (has no administration in time range)  ondansetron (ZOFRAN) injection 4 mg (has no administration in time range)  enoxaparin (LOVENOX) injection 30 mg (has no administration in time range)  furosemide (LASIX) injection 40 mg (has no administration in time range)  hydrALAZINE (APRESOLINE) injection 5 mg (has no administration  in time range)  furosemide (LASIX) injection 40 mg (40  mg Intravenous Given 04/02/23 1646)    ED Course/ Medical Decision Making/ A&P                                 Medical Decision Making Amount and/or Complexity of Data Reviewed Labs: ordered. Radiology: ordered.  Risk Prescription drug management. Decision regarding hospitalization.  This patient presents to the ED for concern of shortness of breath, this involves an extensive number of treatment options, and is a complaint that carries with it a high risk of complications and morbidity.  The differential diagnosis includes pneumonia, PE, heart failure   Co morbidities that complicate the patient evaluation  Hypertension   Additional history obtained:  Additional history obtained from patient External records from outside source obtained and reviewed including hospital records   Lab Tests:  I Ordered, and personally interpreted labs.  The pertinent results include: BNP 560   Imaging Studies ordered:  I ordered imaging studies including chest x-ray I independently visualized and interpreted imaging which showed mild edema I agree with the radiologist interpretation   Cardiac Monitoring: / EKG:  The patient was maintained on a cardiac monitor.  I personally viewed and interpreted the cardiac monitored which showed an underlying rhythm of: Normal sinus rhythm   Consultations Obtained:  I requested consultation with the hospitalist,  and discussed lab and imaging findings as well as pertinent plan - they recommend: Admit for diuresis   Problem List / ED Course / Critical interventions / Medication management  Shortness of breath and congestive heart failure and hypertension I ordered medication including Lasix for diuresis Reevaluation of the patient after these medicines showed that the patient improved I have reviewed the patients home medicines and have made adjustments as needed   Social Determinants of  Health:  None   Test / Admission - Considered:  None  Patient with mild congestive heart failure.  She will be admitted to medicine for diuresis and probable cardiology consult        Final Clinical Impression(s) / ED Diagnoses Final diagnoses:  Systolic congestive heart failure, unspecified HF chronicity (HCC)    Rx / DC Orders ED Discharge Orders     None         Bethann Berkshire, MD 04/03/23 1019

## 2023-04-03 NOTE — Progress Notes (Signed)
  Echocardiogram 2D Echocardiogram has been performed.  Maren Reamer 04/03/2023, 11:44 AM

## 2023-04-03 NOTE — Evaluation (Addendum)
Physical Therapy Evaluation Patient Details Name: [name] MRN: [id number] DOB: 1934/03/30 Today's Date: 04/03/2023  History of Present Illness  [name]  is a 89 y.o. female,  with history of breast cancer, paroxysmal atrial fibrillation, asthma, CKD stage III, hypertension, hyperlipidemia, hypothyroidism, chronic diastolic CHF, squamous cell cancer status post recent excision in anterior area left lower extremity, followed by dermatology Dr. Margo Aye .  -Patient presents today secondary to complaints of shortness of breath, reports it has been progressive over last 2 weeks, she does report some mild orthopnea as well, but it is mainly exertional, as well she does endorse chest pain, midsternal, with activity, relieved with rest, reports generalized weakness, fatigue as well, she denies any hemoptysis, coffee-ground emesis, or lower extremity edema, patient reports she is following daily at her dermatologic office for wound dressing change at the area of recent squamous cell cancer resection and anterior leg left lower extremity patient reports she is not on Lasix at home at baseline, reports she has oxygen at her house, but she never needed it, reports her pulse ox has been documenting low oxygen saturation in the lower to mid 80s with activity.  -In ED patient was noted to be hypoxic in mid 80s at rest on room air, and in the 70s with activity, much more comfortable on 2 L oxygen, BNP elevated at 560, troponins negative x 2, EKG nonacute, low at 131, creatinine at 1.53 (baseline 1.9), chest x-ray with evidence of volume overload and vascular congestion, patient received 1 dose of IV Lasix and [facility] hospitalist consulted to admit.   Clinical Impression  Patient evaluated by Physical Therapy with no further acute PT needs identified. All education has been completed and the patient has no further questions. Patient performed bed mobility, transfers and ambulation without assistive devices. Only  supervision to contact guard assist was given for safety in an unfamiliar environment. No losses of balance. Patient did desaturate to the low 80's after ambulating 150' with no reports of dizziness or blurry vision. Patient cued to take a sit down rest break for 2 minutes until SpO2 returned to >90%. Patient able to stand and complete 150' to her room with SpO2 climbing to 97% during ambulation. Patient agreeable to sitting in recliner on room air at end of session - nursing notified. Patient would benefit from continued ambulation with nursing while in this venue of care. See below for any follow-up Physical Therapy or equipment needs. PT is signing off. Thank you for this referral.         If plan is discharge home, recommend the following: Help with stairs or ramp for entrance   Can travel by private vehicle        Equipment Recommendations None recommended by PT  Recommendations for Other Services       Functional Status Assessment Patient has had a recent decline in their functional status and demonstrates the ability to make significant improvements in function in a reasonable and predictable amount of time.     Precautions / Restrictions Precautions Precautions: Fall Restrictions Weight Bearing Restrictions: No      Mobility  Bed Mobility Overal bed mobility: Modified Independent   General bed mobility comments: increased time    Transfers Overall transfer level: Modified independent Equipment used: None     General transfer comment: increased time    Ambulation/Gait Ambulation/Gait assistance: Supervision Gait Distance (Feet): 300 Feet (150' x2) Assistive device: None Gait Pattern/deviations: Step-through pattern, Decreased step length - left, Decreased  stance time - right, Decreased stride length Gait velocity: decreased     General Gait Details: somewhat slow cadence without assistive device demonstrating fairly good gait dynamics; on room air patient  desated to low 80s during ambulation requiring a 2 minute sit down break and cues for pursed lip breathing to return to >90 SpO2 and continued ambulation back to room. Patient's SpO2 was 97% by the time we reached her room chair. Limited by fatigue  Stairs   Wheelchair Mobility     Tilt Bed    Modified Rankin (Stroke Patients Only)       Balance Overall balance assessment: Modified Independent   Sitting balance-Leahy Scale: Good Sitting balance - Comments: seated at EOB   Standing balance support: No upper extremity supported, During functional activity Standing balance-Leahy Scale: Fair Standing balance comment: fair without assistive device           Pertinent Vitals/Pain Pain Assessment Pain Assessment: No/denies pain    Home Living Family/patient expects to be discharged to:: Private residence Living Arrangements: Children Available Help at Discharge: Family;Available PRN/intermittently Type of Home: House Home Access: Stairs to enter Entrance Stairs-Rails: None Entrance Stairs-Number of Steps: 1   Home Layout: One level Home Equipment: Microbiologist (4 wheels);Cane - single point;Grab bars - tub/shower      Prior Function Prior Level of Function : Driving;Independent/Modified Independent       Mobility Comments: did own grocery shopping; walked community distances without assistive device       Extremity/Trunk Assessment   Upper Extremity Assessment Upper Extremity Assessment: Overall WFL for tasks assessed    Lower Extremity Assessment Lower Extremity Assessment: Overall WFL for tasks assessed    Cervical / Trunk Assessment Cervical / Trunk Assessment: Normal  Communication   Communication Communication: No apparent difficulties  Cognition Arousal: Alert Behavior During Therapy: WFL for tasks assessed/performed Overall Cognitive Status: Within Functional Limits for tasks assessed          General Comments       Exercises     Assessment/Plan    PT Assessment Patient does not need any further PT services  PT Problem List         PT Treatment Interventions      PT Goals (Current goals can be found in the Care Plan section)  Acute Rehab PT Goals Patient Stated Goal: Go home. PT Goal Formulation: With patient Time For Goal Achievement: 04/03/23 Potential to Achieve Goals: Good    Frequency          AM-PAC PT "6 Clicks" Mobility  Outcome Measure Help needed turning from your back to your side while in a flat bed without using bedrails?: None Help needed moving from lying on your back to sitting on the side of a flat bed without using bedrails?: None Help needed moving to and from a bed to a chair (including a wheelchair)?: A Little Help needed standing up from a chair using your arms (e.g., wheelchair or bedside chair)?: A Little Help needed to walk in hospital room?: A Little Help needed climbing 3-5 steps with a railing? : A Little 6 Click Score: 20    End of Session Equipment Utilized During Treatment: Gait belt Activity Tolerance: Patient tolerated treatment well;Patient limited by fatigue Patient left: in chair;with call bell/phone within reach Nurse Communication: Mobility status PT Visit Diagnosis: Other abnormalities of gait and mobility (R26.89);Unsteadiness on feet (R26.81)    Time: 1610-9604 PT Time Calculation (min) (ACUTE ONLY): 28 min  Charges:   PT Evaluation $PT Eval Low Complexity: 1 Low PT Treatments $Therapeutic Activity: 8-22 mins PT General Charges $$ ACUTE PT VISIT: 1 Visit         Katina Dung. Hartnett-Rands, MS, PT [place] [facility] [place] [phone]  Britta Mccreedy  Hartnett-Rands 04/03/2023, 1:06 PM

## 2023-04-04 DIAGNOSIS — J9601 Acute respiratory failure with hypoxia: Secondary | ICD-10-CM | POA: Diagnosis not present

## 2023-04-04 DIAGNOSIS — I5033 Acute on chronic diastolic (congestive) heart failure: Secondary | ICD-10-CM | POA: Diagnosis not present

## 2023-04-04 LAB — CBC WITH DIFFERENTIAL/PLATELET
Abs Immature Granulocytes: 0 10*3/uL (ref 0.00–0.07)
Basophils Absolute: 0 10*3/uL (ref 0.0–0.1)
Basophils Relative: 0 %
Eosinophils Absolute: 0.1 10*3/uL (ref 0.0–0.5)
Eosinophils Relative: 3 %
HCT: 23.6 % — ABNORMAL LOW (ref 36.0–46.0)
Hemoglobin: 7.4 g/dL — ABNORMAL LOW (ref 12.0–15.0)
Immature Granulocytes: 0 %
Lymphocytes Relative: 31 %
Lymphs Abs: 0.7 10*3/uL (ref 0.7–4.0)
MCH: 27.3 pg (ref 26.0–34.0)
MCHC: 31.4 g/dL (ref 30.0–36.0)
MCV: 87.1 fL (ref 80.0–100.0)
Monocytes Absolute: 0.3 10*3/uL (ref 0.1–1.0)
Monocytes Relative: 13 %
Neutro Abs: 1.2 10*3/uL — ABNORMAL LOW (ref 1.7–7.7)
Neutrophils Relative %: 53 %
Platelets: 82 10*3/uL — ABNORMAL LOW (ref 150–400)
RBC: 2.71 MIL/uL — ABNORMAL LOW (ref 3.87–5.11)
RDW: 16 % — ABNORMAL HIGH (ref 11.5–15.5)
WBC: 2.2 10*3/uL — ABNORMAL LOW (ref 4.0–10.5)
nRBC: 0 % (ref 0.0–0.2)

## 2023-04-04 LAB — BASIC METABOLIC PANEL
Anion gap: 7 (ref 5–15)
BUN: 35 mg/dL — ABNORMAL HIGH (ref 8–23)
CO2: 33 mmol/L — ABNORMAL HIGH (ref 22–32)
Calcium: 8.1 mg/dL — ABNORMAL LOW (ref 8.9–10.3)
Chloride: 93 mmol/L — ABNORMAL LOW (ref 98–111)
Creatinine, Ser: 1.73 mg/dL — ABNORMAL HIGH (ref 0.44–1.00)
GFR, Estimated: 28 mL/min — ABNORMAL LOW (ref >60)
Glucose, Bld: 101 mg/dL — ABNORMAL HIGH (ref 70–99)
Potassium: 4.1 mmol/L (ref 3.5–5.1)
Sodium: 133 mmol/L — ABNORMAL LOW (ref 135–145)

## 2023-04-04 LAB — MAGNESIUM: Magnesium: 1.7 mg/dL (ref 1.7–2.4)

## 2023-04-04 MED ORDER — FUROSEMIDE 40 MG PO TABS: 40.0000 mg | ORAL_TABLET | Freq: Every day | ORAL | 11 refills | Status: DC

## 2023-04-04 MED ORDER — MAGNESIUM SULFATE 2 GM/50ML IV SOLN
2.0000 g | Freq: Once | INTRAVENOUS | Status: AC
  Administered 2023-04-04: 2 g via INTRAVENOUS
  Filled 2023-04-04: qty 50

## 2023-04-04 MED ORDER — HYDRALAZINE HCL 50 MG PO TABS: 50.0000 mg | ORAL_TABLET | Freq: Two times a day (BID) | ORAL | 3 refills | Status: DC

## 2023-04-04 NOTE — Plan of Care (Signed)

## 2023-04-04 NOTE — Progress Notes (Signed)
Telemetry called stating patient's hr was 39. Patient was sleeping. This nurse woke the patient up and hr is now in the 50s. MD Girguis notified.

## 2023-04-04 NOTE — Discharge Summary (Signed)
Physician Discharge Summary   [name] MWU:[id number] DOB: 13-Jun-1934 DOA: 04/02/2023  PCP: [name], DO  Admit date: 04/02/2023 Discharge date: 04/04/2023  Admitted From: Home Disposition:  Home Discharging physician: Lewie Chamber, MD Barriers to discharge: none  Recommendations at discharge: Recheck BMP as lasix started at discharge  Repeat echo in 6 months   Discharge Condition: stable CODE STATUS: DNR Diet recommendation:  Diet Orders (From admission, onward)     Start     Ordered   04/04/23 0000  Diet - low sodium heart healthy        04/04/23 1135   04/02/23 1832  Diet Heart Room service appropriate? Yes; Fluid consistency: Thin  Diet effective now       Question Answer Comment  Room service appropriate? Yes   Fluid consistency: Thin      04/02/23 1831            Hospital Course: [name] is an 89 yo female with PMH HFpEF, PAF, HTN, moderate AS, CKD3b, basal cell carcinoma, HLD, hypothyroidism, osteopenia, right renal artery stenosis, arthritis who presented with shortness of breath that was progressively worsening over approximately 2 weeks.  She noted becoming more short of breath with mild exertion including walking. She has recently undergone excision of squamous cell involving left leg and has been doing dressing changes at home. On workup in the ER she was noted to be hypoxic in the mid 80s on room air.  BNP was noted to be elevated.  Troponins were negative x 2.  EKG was negative for signs of ischemia. She was started on IV Lasix and admitted for further workup.  Cardiology was consulted given concern for volume overload and worsening cardiac function. She diuresed well with IV Lasix and respiratory symptoms improved.  She was continued on oral Lasix at discharge with outpatient follow-up with cardiology scheduled.  Assessment and Plan:  Acute respiratory failure with hypoxia Acute on chronic diastolic CHF Chest pain -She presented  with new dyspnea (she is desaturating to 70% upon going to the restroom while in ED, currently on 2 L oxygen) -Evidence of volume overload, has JVD, elevated BNP and evidence of mild pleural effusion and vascular congestion on chest x-ray -08/29/21 Echo EF 65-70%, G2DD -Not on diuretics at baseline.  Recently seen by cardiology on 03/18/2023 - started on Lasix on admission.  Cardiology consulted, appreciate assistance - euvolemic at d/c and O2 at baseline - continued on oral lasix 40 mg daily per cardiology rec's with outpt followup   History of atrial tachycardia, paroxysmal A-fib -Continue with home medication flecainide and bisoprolol -She is not on any anticoagulation - bradycardic at times notably when asleep; but asymptomatic    Hypertension - hydralazine lowered at discharge per rec's and lasix continued as well - no other changes to remainder of home regimen   Thrombocytopenia/leukopenia-this is chronic, at baseline, she has been followed by hematology   History of aortic valve stenosis and MR -Echo repeated on admission showing worsening of aortic stenosis.  Cardiology recommending repeat echo in 6 months   CKD stage IIIb -Baseline appears 1.6 - 1.8 -Remained at baseline prior to discharge   Hyponatremia -Due to volume overload, should improve with diuresis -Sodium stable at 133 at discharge   Lower extremity anterior shin squamous skin cancer -This is status post resection by dermatology Dr. Margo Aye, continue with wound care   Hypothyroidism -continue synthroid   Hyperlipidemia - Continue statin   Right Breast Cancer -continue letrozole  The patient's chronic medical conditions were treated accordingly per the patient's home medication regimen except as noted.  On day of discharge, patient was felt deemed stable for discharge. Patient/family member advised to call PCP or come back to ER if needed.   Principal Diagnosis: Acute on chronic diastolic CHF (congestive  heart failure) (HCC)  Discharge Diagnoses: Active Hospital Problems   Diagnosis Date Noted   Acute on chronic diastolic CHF (congestive heart failure) (HCC) 04/02/2023   Acute respiratory failure with hypoxia (HCC) 08/01/2021   Essential hypertension, malignant    Hypothyroidism 10/15/2015   CKD (chronic kidney disease) stage 3, GFR 30-59 ml/min (HCC) 04/02/2014    Resolved Hospital Problems  No resolved problems to display.     Discharge Instructions     Diet - low sodium heart healthy   Complete by: As directed    Discharge wound care:   Complete by: As directed    Continue home wound care to left leg as previously doing   Increase activity slowly   Complete by: As directed       Allergies as of 04/04/2023       Reactions   Amlodipine Other (See Comments)   edema   Lisinopril    cough   Xeroform Occlusive Gauze Strip [bismuth Tribromoph-petrolatum]    Has to have no stick tape   Isosorbide Nitrate Nausea Only   Sulfonamide Derivatives Nausea And Vomiting, Rash   Terazosin Hives, Nausea Only        Medication List     TAKE these medications    acetaminophen 325 MG tablet Commonly known as: TYLENOL Take 325 mg by mouth daily as needed for moderate pain or headache.   atorvastatin 20 MG tablet Commonly known as: LIPITOR TAKE 1/2 TABLET BY MOUTH DAILY What changed: when to take this   bisoprolol 5 MG tablet Commonly known as: ZEBETA TAKE 1 TABLET (5 MG TOTAL) BY MOUTH DAILY.   cholecalciferol 1000 units tablet Commonly known as: VITAMIN D Take 1,000 Units by mouth daily.   cloNIDine 0.1 MG tablet Commonly known as: CATAPRES Take 1 tablet (0.1 mg total) by mouth as directed. May take one tablet as needed with systolic blood pressure greater than 170   cyanocobalamin 500 MCG tablet Commonly known as: VITAMIN B12 Take 1 tablet (500 mcg total) by mouth daily.   feeding supplement Liqd Take 237 mLs by mouth 2 (two) times daily between meals. What  changed: when to take this   ferrous sulfate 325 (65 FE) MG tablet Take 325 mg by mouth daily with breakfast.   flecainide 50 MG tablet Commonly known as: TAMBOCOR TAKE 1 TABLET BY MOUTH TWICE A DAY   furosemide 40 MG tablet Commonly known as: Lasix Take 1 tablet (40 mg total) by mouth daily.   hydrALAZINE 50 MG tablet Commonly known as: APRESOLINE Take 1 tablet (50 mg total) by mouth 2 (two) times daily. What changed:  medication strength how much to take   letrozole 2.5 MG tablet Commonly known as: FEMARA TAKE 1 TABLET BY MOUTH EVERY DAY   levothyroxine 75 MCG tablet Commonly known as: SYNTHROID TAKE 1 TABLET BY MOUTH EVERY DAY   losartan 25 MG tablet Commonly known as: COZAAR Take 25 mg by mouth daily.   mirtazapine 7.5 MG tablet Commonly known as: REMERON TAKE 1 TABLET BY MOUTH EVERYDAY AT BEDTIME   multivitamin tablet Take 1 tablet by mouth daily.   mupirocin ointment 2 % Commonly known as: BACTROBAN Apply 1  Application topically 2 (two) times daily.   pantoprazole 40 MG tablet Commonly known as: PROTONIX TAKE 1 TABLET (40 MG TOTAL) BY MOUTH TWICE A DAY BEFORE MEALS What changed: See the new instructions.               Discharge Care Instructions  (From admission, onward)           Start     Ordered   04/04/23 0000  Discharge wound care:       Comments: Continue home wound care to left leg as previously doing   04/04/23 1135            Allergies  Allergen Reactions   Amlodipine Other (See Comments)    edema   Lisinopril     cough   Xeroform Occlusive Gauze Strip [Bismuth Tribromoph-Petrolatum]     Has to have no stick tape   Isosorbide Nitrate Nausea Only   Sulfonamide Derivatives Nausea And Vomiting and Rash   Terazosin Hives and Nausea Only    Consultations: Cardiology  Procedures:   Discharge Exam: BP (!) 141/42   Pulse (!) 53   Temp 97.8 F (36.6 C) (Oral)   Resp 20   Ht 5\' 4"  (1.626 m)   Wt 49.2 kg   SpO2  100%   BMI 18.62 kg/m  Physical Exam Constitutional:      Appearance: Normal appearance.  HENT:     Head: Normocephalic and atraumatic.     Mouth/Throat:     Mouth: Mucous membranes are moist.  Eyes:     Extraocular Movements: Extraocular movements intact.  Cardiovascular:     Rate and Rhythm: Normal rate and regular rhythm.  Pulmonary:     Effort: Pulmonary effort is normal. No respiratory distress.     Breath sounds: Normal breath sounds.  Abdominal:     General: Bowel sounds are normal. There is no distension.     Palpations: Abdomen is soft.     Tenderness: There is no abdominal tenderness.  Musculoskeletal:        General: Normal range of motion.     Cervical back: Normal range of motion and neck supple.  Skin:    General: Skin is warm and dry.  Neurological:     General: No focal deficit present.     Mental Status: She is alert.  Psychiatric:        Mood and Affect: Mood normal.      The results of significant diagnostics from this hospitalization (including imaging, microbiology, ancillary and laboratory) are listed below for reference.   Microbiology: No results found for this or any previous visit (from the past 240 hour(s)).   Labs: BNP (last 3 results) Recent Labs    06/16/22 2032 04/02/23 1417  BNP 547.0* 560.0*   Basic Metabolic Panel: Recent Labs  Lab 04/02/23 1417 04/03/23 0420 04/04/23 0503  NA 131* 133* 133*  K 4.1 3.9 4.1  CL 93* 95* 93*  CO2 27 31 33*  GLUCOSE 117* 100* 101*  BUN 19 24* 35*  CREATININE 1.53* 1.84* 1.73*  CALCIUM 8.8* 8.4* 8.1*  MG  --   --  1.7   Liver Function Tests: No results for input(s): "AST", "ALT", "ALKPHOS", "BILITOT", "PROT", "ALBUMIN" in the last 168 hours. No results for input(s): "LIPASE", "AMYLASE" in the last 168 hours. No results for input(s): "AMMONIA" in the last 168 hours. CBC: Recent Labs  Lab 04/02/23 1417 04/03/23 0420 04/04/23 0503  WBC 3.6* 2.6* 2.2*  NEUTROABS  --   --  1.2*  HGB  8.6* 7.6* 7.4*  HCT 27.9* 24.9* 23.6*  MCV 88.0 88.3 87.1  PLT 109* 85* 82*   Cardiac Enzymes: No results for input(s): "CKTOTAL", "CKMB", "CKMBINDEX", "TROPONINI" in the last 168 hours. BNP: Invalid input(s): "POCBNP" CBG: No results for input(s): "GLUCAP" in the last 168 hours. D-Dimer No results for input(s): "DDIMER" in the last 72 hours. Hgb A1c No results for input(s): "HGBA1C" in the last 72 hours. Lipid Profile No results for input(s): "CHOL", "HDL", "LDLCALC", "TRIG", "CHOLHDL", "LDLDIRECT" in the last 72 hours. Thyroid function studies No results for input(s): "TSH", "T4TOTAL", "T3FREE", "THYROIDAB" in the last 72 hours.  Invalid input(s): "FREET3" Anemia work up No results for input(s): "VITAMINB12", "FOLATE", "FERRITIN", "TIBC", "IRON", "RETICCTPCT" in the last 72 hours. Urinalysis    Component Value Date/Time   COLORURINE AMBER (A) 08/26/2022 2205   APPEARANCEUR HAZY (A) 08/26/2022 2205   APPEARANCEUR Clear 10/11/2019 1326   LABSPEC 1.021 08/26/2022 2205   PHURINE 5.0 08/26/2022 2205   GLUCOSEU NEGATIVE 08/26/2022 2205   HGBUR SMALL (A) 08/26/2022 2205   BILIRUBINUR NEGATIVE 08/26/2022 2205   BILIRUBINUR Negative 10/11/2019 1326   KETONESUR NEGATIVE 08/26/2022 2205   PROTEINUR 100 (A) 08/26/2022 2205   UROBILINOGEN 0.2 11/27/2007 1051   NITRITE NEGATIVE 08/26/2022 2205   LEUKOCYTESUR NEGATIVE 08/26/2022 2205   Sepsis Labs Recent Labs  Lab 04/02/23 1417 04/03/23 0420 04/04/23 0503  WBC 3.6* 2.6* 2.2*   Microbiology No results found for this or any previous visit (from the past 240 hour(s)).  Procedures/Studies: ECHOCARDIOGRAM COMPLETE  Result Date: 04/03/2023    ECHOCARDIOGRAM REPORT   Patient Name:   [name] Date of Exam: 04/03/2023 Medical Rec #:  [id number]      Height:       64.0 in Accession #:    [id number]     Weight:       106.7 lb Date of Birth:  Nov 13, 1933      BSA:          1.498 m Patient Age:    89 years       BP:           106/63  mmHg Patient Gender: F              HR:           60 bpm. [facility] Procedure: 2D Echo, Color Doppler and Cardiac Doppler Indications:     CHF-Acute Diastolic I50.31  History:         Patient has prior history of Echocardiogram examinations, most                  recent 08/29/2021. CHF, Aortic Valve Disease, Arrythmias:Atrial                  Fibrillation, Signs/Symptoms:Murmur; Risk Factors:Hypertension,                  Non-Smoker and Dyslipidemia.  Sonographer:     Aron Baba Referring Phys:  [id number] Starleen Arms Diagnosing Phys: Dina Rich MD IMPRESSIONS  1. Left ventricular ejection fraction, by estimation, is 60 to 65%. The left ventricle has normal function. The left ventricle has no regional wall motion abnormalities. There is mild left ventricular hypertrophy. Left ventricular diastolic parameters are consistent with Grade II diastolic dysfunction (pseudonormalization). Elevated left atrial pressure.  2. Right ventricular systolic function is normal. The right ventricular size is mildly enlarged. There is severely elevated pulmonary artery  systolic pressure.  3. Left atrial size was severely dilated.  4. Right atrial size was severely dilated.  5. The mitral valve is abnormal. Mild mitral valve regurgitation. Moderate mitral stenosis. The mean mitral valve gradient is 6.0 mmHg.HR of 60  6. The tricuspid valve is abnormal.  7. The aortic valve has an indeterminant number of cusps. There is moderate calcification of the aortic valve. There is moderate thickening of the aortic valve. Aortic valve regurgitation is mild. Moderate to severe aortic valve stenosis. Aortic valve mean gradient measures 34.0 mmHg. Aortic valve peak gradient measures 48.5 mmHg. Aortic valve area, by VTI measures 0.93 cm.DI 0.41  8. The inferior vena cava is dilated in size with <50% respiratory variability, suggesting right atrial pressure of 15 mmHg. FINDINGS  Left Ventricle: Left ventricular ejection  fraction, by estimation, is 60 to 65%. The left ventricle has normal function. The left ventricle has no regional wall motion abnormalities. The left ventricular internal cavity size was normal in size. There is  mild left ventricular hypertrophy. Left ventricular diastolic parameters are consistent with Grade II diastolic dysfunction (pseudonormalization). Elevated left atrial pressure. Right Ventricle: The right ventricular size is mildly enlarged. Right vetricular wall thickness was not well visualized. Right ventricular systolic function is normal. There is severely elevated pulmonary artery systolic pressure. The tricuspid regurgitant velocity is 4.08 m/s, and with an assumed right atrial pressure of 15 mmHg, the estimated right ventricular systolic pressure is 81.6 mmHg. Left Atrium: Left atrial size was severely dilated. Right Atrium: Right atrial size was severely dilated. Pericardium: There is no evidence of pericardial effusion. Mitral Valve: The mitral valve is abnormal. There is mild thickening of the mitral valve leaflet(s). There is mild calcification of the mitral valve leaflet(s). Mild mitral annular calcification. Mild mitral valve regurgitation. Moderate mitral valve stenosis. MV peak gradient, 17.5 mmHg. The mean mitral valve gradient is 6.0 mmHg. Tricuspid Valve: The tricuspid valve is abnormal. Tricuspid valve regurgitation is mild . No evidence of tricuspid stenosis. Aortic Valve: The aortic valve has an indeterminant number of cusps. There is moderate calcification of the aortic valve. There is moderate thickening of the aortic valve. There is moderate aortic valve annular calcification. Aortic valve regurgitation is mild. Moderate to severe aortic stenosis is present. Aortic valve mean gradient measures 34.0 mmHg. Aortic valve peak gradient measures 48.5 mmHg. Aortic valve area, by VTI measures 0.93 cm. Pulmonic Valve: The pulmonic valve was not well visualized. Pulmonic valve regurgitation  is mild. No evidence of pulmonic stenosis. Aorta: The aortic root and ascending aorta are structurally normal, with no evidence of dilitation. Venous: The inferior vena cava is dilated in size with less than 50% respiratory variability, suggesting right atrial pressure of 15 mmHg. IAS/Shunts: The interatrial septum was not well visualized.  LEFT VENTRICLE PLAX 2D LVIDd:         4.40 cm   Diastology LVIDs:         2.80 cm   LV e' medial:    5.35 cm/s LV PW:         1.20 cm   LV E/e' medial:  29.9 LV IVS:        1.10 cm   LV e' lateral:   4.70 cm/s LVOT diam:     1.70 cm   LV E/e' lateral: 34.0 LV SV:         87 LV SV Index:   58 LVOT Area:     2.27 cm  RIGHT VENTRICLE RV S prime:  12.60 cm/s TAPSE (M-mode): 2.6 cm LEFT ATRIUM             Index        RIGHT ATRIUM           Index LA diam:        3.80 cm 2.54 cm/m   RA Area:     22.30 cm LA Vol (A2C):   93.2 ml 62.22 ml/m  RA Volume:   71.40 ml  47.67 ml/m LA Vol (A4C):   55.7 ml 37.18 ml/m LA Biplane Vol: 74.7 ml 49.87 ml/m  AORTIC VALVE                     PULMONIC VALVE AV Area (Vmax):    0.90 cm      PR End Diast Vel: 9.18 msec AV Area (Vmean):   0.88 cm AV Area (VTI):     0.93 cm AV Vmax:           348.25 cm/s AV Vmean:          252.500 cm/s AV VTI:            0.931 m AV Peak Grad:      48.5 mmHg AV Mean Grad:      34.0 mmHg LVOT Vmax:         138.38 cm/s LVOT Vmean:        98.000 cm/s LVOT VTI:          0.383 m LVOT/AV VTI ratio: 0.41  AORTA Ao Root diam: 2.70 cm Ao Asc diam:  3.30 cm MITRAL VALVE                TRICUSPID VALVE MV Area (PHT): 3.12 cm     TR Peak grad:   66.6 mmHg MV Peak grad:  17.5 mmHg    TR Vmax:        408.00 cm/s MV Mean grad:  6.0 mmHg MV Vmax:       2.09 m/s     SHUNTS MV Vmean:      114.0 cm/s   Systemic VTI:  0.38 m MV Decel Time: 243 msec     Systemic Diam: 1.70 cm MR Peak grad: 51.6 mmHg MR Vmax:      359.00 cm/s MV E velocity: 160.00 cm/s MV A velocity: 112.00 cm/s MV E/A ratio:  1.43 Dina Rich MD Electronically  signed by Dina Rich MD Signature Date/Time: 04/03/2023/1:32:28 PM    Final (Updated)    DG Chest 2 View  Result Date: 04/02/2023 CLINICAL DATA:  Chest pain. Shortness of breath for a few days. History of CHF EXAM: CHEST - 2 VIEW COMPARISON:  X-ray 09/20/2022 FINDINGS: Hyperinflation. Enlarged cardiopericardial silhouette. Mild interstitial changes. Possible component of edema. Tiny pleural effusions are seen, right-greater-than-left. No pneumothorax. Calcified aorta IMPRESSION: Enlarged heart. New tiny effusions. Interstitial changes seen, possible mild edema. No consolidation Recommend follow up Electronically Signed   By: Karen Kays M.D.   On: 04/02/2023 15:58   MM DIAG BREAST TOMO BILATERAL  Result Date: 03/24/2023 CLINICAL DATA:  History of RIGHT lumpectomy in 2019. EXAM: DIGITAL DIAGNOSTIC BILATERAL MAMMOGRAM WITH TOMOSYNTHESIS AND CAD TECHNIQUE: Bilateral digital diagnostic mammography and breast tomosynthesis was performed. The images were evaluated with computer-aided detection. COMPARISON:  Previous exam(s). ACR Breast Density Category c: The breasts are heterogeneously dense, which may obscure small masses. FINDINGS: No suspicious mass, distortion, or microcalcifications are identified to suggest presence of malignancy. Minimal postoperative changes are present in  the RIGHT breast. IMPRESSION: No mammographic evidence for malignancy. RECOMMENDATION: Per protocol, as the patient is now 2 or more years status post lumpectomy, she may return to annual screening mammography in 1 year. However, given the history of breast cancer, the patient remains eligible for annual diagnostic mammography if preferred. I have discussed the findings and recommendations with the patient. If applicable, a reminder letter will be sent to the patient regarding the next appointment. BI-RADS CATEGORY  2: Benign. Electronically Signed   By: Norva Pavlov M.D.   On: 03/24/2023 10:22     Time coordinating  discharge: Over 30 minutes    Lewie Chamber, MD  [facility] 04/04/2023, 12:16 PM

## 2023-04-06 ENCOUNTER — Inpatient Hospital Stay: Payer: PPO | Attending: Hematology

## 2023-04-06 ENCOUNTER — Telehealth: Payer: Self-pay | Admitting: Cardiology

## 2023-04-06 ENCOUNTER — Telehealth: Payer: Self-pay

## 2023-04-06 DIAGNOSIS — D696 Thrombocytopenia, unspecified: Secondary | ICD-10-CM | POA: Diagnosis not present

## 2023-04-06 DIAGNOSIS — D72819 Decreased white blood cell count, unspecified: Secondary | ICD-10-CM | POA: Insufficient documentation

## 2023-04-06 DIAGNOSIS — M8580 Other specified disorders of bone density and structure, unspecified site: Secondary | ICD-10-CM | POA: Insufficient documentation

## 2023-04-06 DIAGNOSIS — C50511 Malignant neoplasm of lower-outer quadrant of right female breast: Secondary | ICD-10-CM | POA: Insufficient documentation

## 2023-04-06 DIAGNOSIS — Z170 Estrogen receptor positive status [ER+]: Secondary | ICD-10-CM | POA: Diagnosis not present

## 2023-04-06 DIAGNOSIS — Z79811 Long term (current) use of aromatase inhibitors: Secondary | ICD-10-CM | POA: Diagnosis not present

## 2023-04-06 DIAGNOSIS — D649 Anemia, unspecified: Secondary | ICD-10-CM

## 2023-04-06 DIAGNOSIS — D72810 Lymphocytopenia: Secondary | ICD-10-CM

## 2023-04-06 DIAGNOSIS — Z79899 Other long term (current) drug therapy: Secondary | ICD-10-CM | POA: Insufficient documentation

## 2023-04-06 DIAGNOSIS — D500 Iron deficiency anemia secondary to blood loss (chronic): Secondary | ICD-10-CM

## 2023-04-06 LAB — CBC WITH DIFFERENTIAL/PLATELET
Abs Immature Granulocytes: 0.01 10*3/uL (ref 0.00–0.07)
Basophils Absolute: 0 10*3/uL (ref 0.0–0.1)
Basophils Relative: 1 %
Eosinophils Absolute: 0 10*3/uL (ref 0.0–0.5)
Eosinophils Relative: 1 %
HCT: 27.5 % — ABNORMAL LOW (ref 36.0–46.0)
Hemoglobin: 8.4 g/dL — ABNORMAL LOW (ref 12.0–15.0)
Immature Granulocytes: 1 %
Lymphocytes Relative: 23 %
Lymphs Abs: 0.5 10*3/uL — ABNORMAL LOW (ref 0.7–4.0)
MCH: 27 pg (ref 26.0–34.0)
MCHC: 30.5 g/dL (ref 30.0–36.0)
MCV: 88.4 fL (ref 80.0–100.0)
Monocytes Absolute: 0.2 10*3/uL (ref 0.1–1.0)
Monocytes Relative: 8 %
Neutro Abs: 1.4 10*3/uL — ABNORMAL LOW (ref 1.7–7.7)
Neutrophils Relative %: 66 %
Platelets: 114 10*3/uL — ABNORMAL LOW (ref 150–400)
RBC: 3.11 MIL/uL — ABNORMAL LOW (ref 3.87–5.11)
RDW: 16 % — ABNORMAL HIGH (ref 11.5–15.5)
WBC: 2.1 10*3/uL — ABNORMAL LOW (ref 4.0–10.5)
nRBC: 0 % (ref 0.0–0.2)

## 2023-04-06 LAB — COMPREHENSIVE METABOLIC PANEL
ALT: 12 U/L (ref 0–44)
AST: 21 U/L (ref 15–41)
Albumin: 3.5 g/dL (ref 3.5–5.0)
Alkaline Phosphatase: 44 U/L (ref 38–126)
Anion gap: 9 (ref 5–15)
BUN: 31 mg/dL — ABNORMAL HIGH (ref 8–23)
CO2: 33 mmol/L — ABNORMAL HIGH (ref 22–32)
Calcium: 8.5 mg/dL — ABNORMAL LOW (ref 8.9–10.3)
Chloride: 91 mmol/L — ABNORMAL LOW (ref 98–111)
Creatinine, Ser: 1.76 mg/dL — ABNORMAL HIGH (ref 0.44–1.00)
GFR, Estimated: 28 mL/min — ABNORMAL LOW (ref >60)
Glucose, Bld: 144 mg/dL — ABNORMAL HIGH (ref 70–99)
Potassium: 3.7 mmol/L (ref 3.5–5.1)
Sodium: 133 mmol/L — ABNORMAL LOW (ref 135–145)
Total Bilirubin: 0.5 mg/dL (ref 0.3–1.2)
Total Protein: 6.7 g/dL (ref 6.5–8.1)

## 2023-04-06 LAB — IRON AND TIBC
Iron: 37 ug/dL (ref 28–170)
Saturation Ratios: 14 % (ref 10.4–31.8)
TIBC: 263 ug/dL (ref 250–450)
UIBC: 226 ug/dL

## 2023-04-06 LAB — LACTATE DEHYDROGENASE: LDH: 114 U/L (ref 98–192)

## 2023-04-06 LAB — FERRITIN: Ferritin: 287 ng/mL (ref 11–307)

## 2023-04-06 LAB — VITAMIN B12: Vitamin B-12: 1088 pg/mL — ABNORMAL HIGH (ref 180–914)

## 2023-04-06 NOTE — Telephone Encounter (Signed)
Called pt in regards to her Lasix. She is wondering how long she is to be on this medication. She was at the hospital from last Thursday through Saturday. She was put on 40mg  of furosemide. She got a 90 day supply. She does not know what to do. Advised pt that we will forward this message to Dr. Antoine Poche and his nurse for recommendation. Pt verbalized understanding.

## 2023-04-06 NOTE — Transitions of Care (Post Inpatient/ED Visit) (Signed)
04/06/2023  Name: [name] MRN: [id number] DOB: August 09, 1934  Today's TOC FU Call Status: Today's TOC FU Call Status:: Successful TOC FU Call Completed TOC FU Call Complete Date: 04/06/23  Transition Care Management Follow-up Telephone Call Date of Discharge: 04/04/23 [facility] Type of Discharge: Inpatient Admission Primary Inpatient Discharge Diagnosis:: Acute Respiratory Failure with Hypoxia How have you been since you were released from the hospital?: Better (Patient does not like being on oxygen, aside from that she is better.) Any questions or concerns?: No  Items Reviewed: Did you receive and understand the discharge instructions provided?: Yes Medications obtained,verified, and reconciled?: Yes (Medications Reviewed) Any new allergies since your discharge?: No Dietary orders reviewed?: Yes Type of Diet Ordered:: Low sodium, heart healthy Do you have support at home?: Yes People in Home: child(ren), adult (Patients daughter stays with her at night) Name of Support/Comfort Primary Source: [name]  Medications Reviewed Today: Medications Reviewed Today     Reviewed by Jodelle Gross, RN (Case Manager) on 04/06/23 at 1507  Med List Status: <None>   Medication Order Taking? Sig Documenting Provider Last Dose Status Informant  acetaminophen (TYLENOL) 325 MG tablet [id number] Yes Take 325 mg by mouth daily as needed for moderate pain or headache. [provider] Taking Active Self  atorvastatin (LIPITOR) 20 MG tablet [id number] Yes TAKE 1/2 TABLET BY MOUTH DAILY  Patient taking differently: Take 10 mg by mouth at bedtime.   Delynn Flavin M, DO Taking Active Self  bisoprolol (ZEBETA) 5 MG tablet [id number] Yes TAKE 1 TABLET (5 MG TOTAL) BY MOUTH DAILY. Delynn Flavin M, DO Taking Active Self  cholecalciferol (VITAMIN D) 1000 units tablet [id number] Yes Take 1,000 Units by mouth daily. [provider] Taking Active Self   cloNIDine (CATAPRES) 0.1 MG tablet [id number] Yes Take 1 tablet (0.1 mg total) by mouth as directed. May take one tablet as needed with systolic blood pressure greater than 170 Hochrein, Fayrene Fearing, MD Taking Active Self  cyanocobalamin (VITAMIN B12) 500 MCG tablet [id number] Yes Take 1 tablet (500 mcg total) by mouth daily. Carnella Guadalajara, PA-C Taking Active Self  feeding supplement (ENSURE ENLIVE / ENSURE PLUS) LIQD [id number] Yes Take 237 mLs by mouth 2 (two) times daily between meals.  Patient taking differently: Take 237 mLs by mouth daily.   Catarina Hartshorn, MD Taking Active Self           Med Note Elesa Massed, Dava Najjar Apr 02, 2023  4:25 PM) Pt states that she sometimes forgets but typically drinks one at least once a day.  ferrous sulfate 325 (65 FE) MG tablet [id number] Yes Take 325 mg by mouth daily with breakfast. [provider] Taking Active Self  flecainide (TAMBOCOR) 50 MG tablet [id number] Yes TAKE 1 TABLET BY MOUTH TWICE A Kenn File, MD Taking Active Self  furosemide (LASIX) 40 MG tablet [id number] Yes Take 1 tablet (40 mg total) by mouth daily. Lewie Chamber, MD Taking Active   hydrALAZINE (APRESOLINE) 50 MG tablet [id number] Yes Take 1 tablet (50 mg total) by mouth 2 (two) times daily. Lewie Chamber, MD Taking Active   letrozole [facility]) 2.5 MG tablet [id number] Yes TAKE 1 TABLET BY MOUTH EVERY DAY Doreatha Massed, MD Taking Active Self  levothyroxine (SYNTHROID) 75 MCG tablet [id number] Yes TAKE 1 TABLET BY MOUTH EVERY DAY Delynn Flavin M, DO Taking Active Self  losartan (COZAAR) 25 MG tablet [id number] Yes Take 25 mg by mouth daily. [provider] Taking  Active Self  mirtazapine (REMERON) 7.5 MG tablet [id number] Yes TAKE 1 TABLET BY MOUTH EVERYDAY AT BEDTIME Letta Median, PA-C Taking Active Self  Multiple Vitamin (MULTIVITAMIN) tablet [id number] Yes Take 1 tablet by mouth daily. [provider] Taking Active Self  mupirocin ointment  (BACTROBAN) 2 % [id number] Yes Apply 1 Application topically 2 (two) times daily. [provider] Taking Active Self  pantoprazole (PROTONIX) 40 MG tablet [id number] Yes TAKE 1 TABLET (40 MG TOTAL) BY MOUTH TWICE A DAY BEFORE MEALS  Patient taking differently: Take 40 mg by mouth daily.   Letta Median, PA-C Taking Active Self            Home Care and Equipment/Supplies: Were Home Health Services Ordered?: No Any new equipment or medical supplies ordered?: No  Functional Questionnaire: Do you need assistance with bathing/showering or dressing?: No Do you need assistance with meal preparation?: No Do you need assistance with eating?: No Do you have difficulty maintaining continence: No Do you need assistance with getting out of bed/getting out of a chair/moving?: No Do you have difficulty managing or taking your medications?: No  Follow up appointments reviewed: PCP Follow-up appointment confirmed?: NA Specialist Hospital Follow-up appointment confirmed?: Yes Date of Specialist follow-up appointment?: 05/12/23 Follow-Up Specialty Provider:: Talbert Forest Do you need transportation to your follow-up appointment?: No Do you understand care options if your condition(s) worsen?: Yes-patient verbalized understanding  SDOH Interventions Today    Flowsheet Row Most Recent Value  SDOH Interventions   Food Insecurity Interventions Intervention Not Indicated  Housing Interventions Intervention Not Indicated      TOC Interventions Today    Flowsheet Row Most Recent Value  TOC Interventions   TOC Interventions Discussed/Reviewed TOC Interventions Discussed, TOC Interventions Reviewed  [Discussed benefits of [employer] (custodial care, meals) for recently hospitalized patient]       Jodelle Gross, RN, BSN, CCM Care Management Coordinator [facility] [facility]

## 2023-04-06 NOTE — Telephone Encounter (Signed)
Spoke with pt, aware she will continue the furosemide 40 mg once daily until seen 05/12/23. She reports she is not having any problems and is tolerating all her medications fine.

## 2023-04-06 NOTE — Telephone Encounter (Signed)
Pt c/o medication issue:  1. Name of Medication: furosemide (LASIX) 40 MG tablet   2. How are you currently taking this medication (dosage and times per day)?    3. Are you having a reaction (difficulty breathing--STAT)? no  4. What is your medication issue? Calling to see how long she is suppose to be on the medication. Please advise

## 2023-04-07 ENCOUNTER — Other Ambulatory Visit: Payer: PPO

## 2023-04-07 ENCOUNTER — Inpatient Hospital Stay: Payer: PPO

## 2023-04-07 ENCOUNTER — Telehealth: Payer: Self-pay | Admitting: Family Medicine

## 2023-04-07 DIAGNOSIS — Z515 Encounter for palliative care: Secondary | ICD-10-CM | POA: Diagnosis not present

## 2023-04-07 DIAGNOSIS — I5032 Chronic diastolic (congestive) heart failure: Secondary | ICD-10-CM | POA: Diagnosis not present

## 2023-04-07 NOTE — Telephone Encounter (Signed)
Informed pt that they only thing I have is for pt's that have [employer] insurance not private ins, she may have talked to her insurance rep yesterday.

## 2023-04-08 ENCOUNTER — Encounter: Payer: Self-pay | Admitting: Physician Assistant

## 2023-04-08 ENCOUNTER — Telehealth: Payer: Self-pay | Admitting: *Deleted

## 2023-04-08 ENCOUNTER — Ambulatory Visit: Payer: PPO | Admitting: Physician Assistant

## 2023-04-08 ENCOUNTER — Other Ambulatory Visit: Payer: Self-pay | Admitting: Physician Assistant

## 2023-04-08 DIAGNOSIS — D649 Anemia, unspecified: Secondary | ICD-10-CM

## 2023-04-08 NOTE — Telephone Encounter (Signed)
Per Rojelio Brenner - PAC, notified patient of hgb results.  Lab appointment made for day of follow up, to include CBC and BB sample.  Patient aware to go to ER if symptomatic or has any bleeding.

## 2023-04-08 NOTE — Progress Notes (Signed)
Review of most recent labs shows significant worsening of anemia with Hgb 8.4 despite normal iron and B12 levels. We will arrange for her to have repeat CBC + BB sample at her office visit in 2 weeks.  We will also tentatively plan to start her on Retacrit/Procrit injections at that time, pending discussion with patient during that visit.  We have previously suggested bone marrow biopsy, which she has declined.  Carnella Guadalajara, PA-C 04/08/23 1:56 PM

## 2023-04-13 DIAGNOSIS — J449 Chronic obstructive pulmonary disease, unspecified: Secondary | ICD-10-CM | POA: Diagnosis not present

## 2023-04-14 ENCOUNTER — Ambulatory Visit: Payer: PPO | Admitting: Physician Assistant

## 2023-04-14 ENCOUNTER — Ambulatory Visit: Payer: PPO

## 2023-04-15 ENCOUNTER — Ambulatory Visit: Payer: PPO

## 2023-04-15 ENCOUNTER — Ambulatory Visit: Payer: PPO | Admitting: Physician Assistant

## 2023-04-15 LAB — METHYLMALONIC ACID, SERUM: Methylmalonic Acid, Quantitative: 669 nmol/L — ABNORMAL HIGH (ref 0–378)

## 2023-04-17 ENCOUNTER — Telehealth: Payer: Self-pay | Admitting: Family Medicine

## 2023-04-17 ENCOUNTER — Encounter: Payer: Self-pay | Admitting: Family Medicine

## 2023-04-17 ENCOUNTER — Ambulatory Visit (INDEPENDENT_AMBULATORY_CARE_PROVIDER_SITE_OTHER): Payer: PPO | Admitting: Family Medicine

## 2023-04-17 ENCOUNTER — Ambulatory Visit (INDEPENDENT_AMBULATORY_CARE_PROVIDER_SITE_OTHER): Payer: PPO

## 2023-04-17 VITALS — BP 156/53 | HR 65 | Temp 97.9°F | Resp 20 | Ht 64.0 in | Wt 110.0 lb

## 2023-04-17 DIAGNOSIS — J9601 Acute respiratory failure with hypoxia: Secondary | ICD-10-CM

## 2023-04-17 DIAGNOSIS — D649 Anemia, unspecified: Secondary | ICD-10-CM | POA: Diagnosis not present

## 2023-04-17 DIAGNOSIS — I129 Hypertensive chronic kidney disease with stage 1 through stage 4 chronic kidney disease, or unspecified chronic kidney disease: Secondary | ICD-10-CM | POA: Diagnosis not present

## 2023-04-17 DIAGNOSIS — N2889 Other specified disorders of kidney and ureter: Secondary | ICD-10-CM | POA: Diagnosis not present

## 2023-04-17 DIAGNOSIS — I517 Cardiomegaly: Secondary | ICD-10-CM | POA: Diagnosis not present

## 2023-04-17 DIAGNOSIS — I5033 Acute on chronic diastolic (congestive) heart failure: Secondary | ICD-10-CM

## 2023-04-17 DIAGNOSIS — Z09 Encounter for follow-up examination after completed treatment for conditions other than malignant neoplasm: Secondary | ICD-10-CM

## 2023-04-17 DIAGNOSIS — N184 Chronic kidney disease, stage 4 (severe): Secondary | ICD-10-CM | POA: Diagnosis not present

## 2023-04-17 DIAGNOSIS — N2581 Secondary hyperparathyroidism of renal origin: Secondary | ICD-10-CM | POA: Diagnosis not present

## 2023-04-17 DIAGNOSIS — I509 Heart failure, unspecified: Secondary | ICD-10-CM | POA: Diagnosis not present

## 2023-04-17 DIAGNOSIS — E785 Hyperlipidemia, unspecified: Secondary | ICD-10-CM | POA: Diagnosis not present

## 2023-04-17 DIAGNOSIS — R0902 Hypoxemia: Secondary | ICD-10-CM | POA: Diagnosis not present

## 2023-04-17 DIAGNOSIS — J90 Pleural effusion, not elsewhere classified: Secondary | ICD-10-CM | POA: Diagnosis not present

## 2023-04-17 NOTE — Telephone Encounter (Signed)
Yes, if kidney doctor ok with this, I am

## 2023-04-17 NOTE — Telephone Encounter (Signed)
Pt called stating that her kidney doctor also reviewed her xray results and advised her to increase her fluid pill for the next 3 days. Wants to know if Dr Nadine Counts agrees with this?

## 2023-04-17 NOTE — Telephone Encounter (Signed)
Please advise 

## 2023-04-17 NOTE — Telephone Encounter (Signed)
Patient came in to office in person and was notified. Patient verbalized understanding

## 2023-04-17 NOTE — Progress Notes (Signed)
Subjective: QI:[id number] discharge follow up PCP: [name], DO UXL:KGMWN B Foard is a 89 y.o. female presenting to clinic today for:  1. Acute respiratory failure due to acute on chronic heart failure. She is accompanied to today's visit by her daughter.  She reports improvement in appetite since discharge.  Breathing has remained stable since discharge, overall better than when she went in.  She has had a time with trying to carry around her portable O2 because it's too heavy for her.  This restricts her from going out and about on her own.  She will be bringing in a [employer] form since she is on continuous O2.  Monitoring weights daily. Up 2lb since discharge but again attributes this to eating more now.  She reports no leg edema.  Scheduled with cardiology 05/12/2023. Would like to see a pulmonologist  ROS: Per HPI  Allergies  Allergen Reactions   Amlodipine Other (See Comments)    edema   Lisinopril     cough   Xeroform Occlusive Gauze Strip [Bismuth Tribromoph-Petrolatum]     Has to have no stick tape   Isosorbide Nitrate Nausea Only   Sulfonamide Derivatives Nausea And Vomiting and Rash   Terazosin Hives and Nausea Only   Past Medical History:  Diagnosis Date   Anemia    Arthritis    Asthma    Atrial fibrillation (HCC)    Basal cell carcinoma 02/06/2009   Left ear targus- ([facility])   Basal cell carcinoma 09/28/2008   Right back-([id number])   CKD (chronic kidney disease), stage III (HCC)    Heart murmur    Hyperlipidemia    Hypertension    Hypothyroidism    Nodule of right lung    Right upper lobe   Osteopenia due to cancer therapy 10/07/2022   Pneumonia    PONV (postoperative nausea and vomiting)    Renal insufficiency    Chronic   Renal vascular disease    Right renal artery stenosis (HCC) 05/09/2015   Squamous cell carcinoma of skin 09/07/2019   KA-Left shin-txpbx   Vertigo     Current Outpatient Medications:    acetaminophen (TYLENOL) 325 MG  tablet, Take 325 mg by mouth daily as needed for moderate pain or headache., Disp: , Rfl:    atorvastatin (LIPITOR) 20 MG tablet, TAKE 1/2 TABLET BY MOUTH DAILY (Patient taking differently: Take 10 mg by mouth at bedtime.), Disp: 45 tablet, Rfl: 1   bisoprolol (ZEBETA) 5 MG tablet, TAKE 1 TABLET (5 MG TOTAL) BY MOUTH DAILY., Disp: 90 tablet, Rfl: 3   cholecalciferol (VITAMIN D) 1000 units tablet, Take 1,000 Units by mouth daily., Disp: , Rfl:    cloNIDine (CATAPRES) 0.1 MG tablet, Take 1 tablet (0.1 mg total) by mouth as directed. May take one tablet as needed with systolic blood pressure greater than 170, Disp: 30 tablet, Rfl: 1   cyanocobalamin (VITAMIN B12) 500 MCG tablet, Take 1 tablet (500 mcg total) by mouth daily., Disp: , Rfl:    feeding supplement (ENSURE ENLIVE / ENSURE PLUS) LIQD, Take 237 mLs by mouth 2 (two) times daily between meals. (Patient taking differently: Take 237 mLs by mouth daily.), Disp: 237 mL, Rfl: 12   ferrous sulfate 325 (65 FE) MG tablet, Take 325 mg by mouth daily with breakfast., Disp: , Rfl:    flecainide (TAMBOCOR) 50 MG tablet, TAKE 1 TABLET BY MOUTH TWICE A DAY, Disp: 180 tablet, Rfl: 3   furosemide (LASIX) 40 MG tablet, Take 1  tablet (40 mg total) by mouth daily., Disp: 30 tablet, Rfl: 11   hydrALAZINE (APRESOLINE) 50 MG tablet, Take 1 tablet (50 mg total) by mouth 2 (two) times daily., Disp: 60 tablet, Rfl: 3   letrozole (FEMARA) 2.5 MG tablet, TAKE 1 TABLET BY MOUTH EVERY DAY, Disp: 90 tablet, Rfl: 4   levothyroxine (SYNTHROID) 75 MCG tablet, TAKE 1 TABLET BY MOUTH EVERY DAY, Disp: 90 tablet, Rfl: 3   losartan (COZAAR) 25 MG tablet, Take 25 mg by mouth daily., Disp: , Rfl:    mirtazapine (REMERON) 7.5 MG tablet, TAKE 1 TABLET BY MOUTH EVERYDAY AT BEDTIME, Disp: 90 tablet, Rfl: 3   Multiple Vitamin (MULTIVITAMIN) tablet, Take 1 tablet by mouth daily., Disp: , Rfl:    mupirocin ointment (BACTROBAN) 2 %, Apply 1 Application topically 2 (two) times daily., Disp: ,  Rfl:    pantoprazole (PROTONIX) 40 MG tablet, TAKE 1 TABLET (40 MG TOTAL) BY MOUTH TWICE A DAY BEFORE MEALS (Patient taking differently: Take 40 mg by mouth daily.), Disp: 180 tablet, Rfl: 1 Social History   Socioeconomic History   Marital status: Widowed    Spouse name: Not on file   Number of children: 2   Years of education: 50   Highest education level: Not on file  Occupational History   Occupation: Retired  Tobacco Use   Smoking status: Never   Smokeless tobacco: Never  Vaping Use   Vaping status: Never Used  Substance and Sexual Activity   Alcohol use: No   Drug use: No   Sexual activity: Not Currently  Other Topics Concern   Not on file  Social History Narrative   Lives alone, but her daughter stays there at night   Ambidextrous (writes with left hand).   No caffeine use.   Social Determinants of Health   Financial Resource Strain: Low Risk  (12/15/2022)   Overall Financial Resource Strain (CARDIA)    Difficulty of Paying Living Expenses: Not hard at all  Food Insecurity: No Food Insecurity (04/06/2023)   Hunger Vital Sign    Worried About Running Out of Food in the Last Year: Never true    Ran Out of Food in the Last Year: Never true  Transportation Needs: No Transportation Needs (04/02/2023)   PRAPARE - Administrator, Civil Service (Medical): No    Lack of Transportation (Non-Medical): No  Physical Activity: Inactive (12/15/2022)   Exercise Vital Sign    Days of Exercise per Week: 0 days    Minutes of Exercise per Session: 0 min  Stress: No Stress Concern Present (12/15/2022)   [employer] of Occupational Health - Occupational Stress Questionnaire    Feeling of Stress : Not at all  Social Connections: Moderately Isolated (12/15/2022)   Social Connection and Isolation Panel [NHANES]    Frequency of Communication with Friends and Family: More than three times a week    Frequency of Social Gatherings with Friends and Family: Three times a week     Attends Religious Services: More than 4 times per year    Active Member of Clubs or Organizations: No    Attends Banker Meetings: Never    Marital Status: Widowed  Intimate Partner Violence: Not At Risk (04/02/2023)   Humiliation, Afraid, Rape, and Kick questionnaire    Fear of Current or Ex-Partner: No    Emotionally Abused: No    Physically Abused: No    Sexually Abused: No   Family History  Problem Relation Age  of Onset   Thyroid disease Mother    Stroke Father    Hypertension Brother    Lung cancer Maternal Uncle    Cancer Maternal Grandmother        Liver   Lung cancer Maternal Uncle    Hypertension Daughter    Hypercholesterolemia Daughter    Hypercholesterolemia Daughter     Objective: Office vital signs reviewed. BP (!) 156/53   Pulse 65   Temp 97.9 F (36.6 C) (Temporal)   Resp 20   Ht 5\' 4"  (1.626 m)   Wt 110 lb (49.9 kg)   SpO2 95% Comment: 2 liters O2  BMI 18.88 kg/m   Physical Examination:  General: Awake, alert, thin, chronically ill-appearing female, No acute distress HEENT: sclera white, MMM Cardio: regular rate and rhythm, S1S2 heard, 3/6 SEM Pulm: clear to auscultation bilaterally, no wheezes, rhonchi or rales; normal work of breathing on 2L O2  DG Chest 2 View  Result Date: 04/17/2023 CLINICAL DATA:  CHF.  Hypoxia. EXAM: CHEST - 2 VIEW COMPARISON:  Chest radiographs 04/02/2023 FINDINGS: The cardiac silhouette remains enlarged. Aortic atherosclerosis is noted. The lungs remain hyperinflated. Prominence of the interstitial markings bilaterally is similar to the prior study. A small right pleural effusion has mildly enlarged. There is a persistent trace left pleural effusion. A new small opacity in the right mid lung may reflect a small amount of fluid in the minor fissure or atelectasis. No pneumothorax is identified. No acute osseous abnormality is seen. IMPRESSION: Cardiomegaly and similar interstitial densities which could reflect mild  edema. Increased, small right pleural effusion an unchanged trace left pleural effusion. Electronically Signed   By: Sebastian Ache M.D.   On: 04/17/2023 08:58    Assessment/ Plan: 89 y.o. female   Acute respiratory failure with hypoxia (HCC) - Plan: Basic Metabolic Panel, CBC with Differential, DG Chest 2 View, Ambulatory referral to Pulmonology  Hospital discharge follow-up - Plan: Ambulatory referral to Pulmonology  Acute on chronic diastolic CHF (congestive heart failure) (HCC) - Plan: Basic Metabolic Panel, CBC with Differential, DG Chest 2 View, Ambulatory referral to Pulmonology  Chest x-ray with what appears to be a small effusion on the right.  She was oxygenating well on 2 L O2 but clinically seems to be improving compared to hospitalization so we will monitor for now.  Continue on diuretic as prescribed.  Recheck BMP, CBC.  Low threshold to get reevaluated in ER if starts demonstrating signs of fluid overload again  Referral to pulmonology placed.  Keep appointment with cardiology   No orders of the defined types were placed in this encounter.  No orders of the defined types were placed in this encounter.   Today's visit is for Transitional Care Management.  The patient was discharged from [facility] on 04/04/2023 with a primary diagnosis of Acute respiratory failure with hypoxia.   Contact with the patient and/or caregiver, by a clinical staff member, was made on 04/06/2023 and was documented as a telephone encounter within the EMR.  Through chart review and discussion with the patient I have determined that management of their condition is of moderate complexity.    [name], DO [facility] [phone]

## 2023-04-18 LAB — CBC WITH DIFFERENTIAL/PLATELET
Basophils Absolute: 0 10*3/uL (ref 0.0–0.2)
Basos: 0 %
EOS (ABSOLUTE): 0 10*3/uL (ref 0.0–0.4)
Eos: 1 %
Hematocrit: 28.2 % — ABNORMAL LOW (ref 34.0–46.6)
Hemoglobin: 8.5 g/dL — CL (ref 11.1–15.9)
Immature Grans (Abs): 0 10*3/uL (ref 0.0–0.1)
Immature Granulocytes: 0 %
Lymphocytes Absolute: 0.6 10*3/uL — ABNORMAL LOW (ref 0.7–3.1)
Lymphs: 22 %
MCH: 26.2 pg — ABNORMAL LOW (ref 26.6–33.0)
MCHC: 30.1 g/dL — ABNORMAL LOW (ref 31.5–35.7)
MCV: 87 fL (ref 79–97)
Monocytes Absolute: 0.3 10*3/uL (ref 0.1–0.9)
Monocytes: 10 %
Neutrophils Absolute: 1.9 10*3/uL (ref 1.4–7.0)
Neutrophils: 67 %
Platelets: 66 10*3/uL — CL (ref 150–450)
RBC: 3.25 x10E6/uL — ABNORMAL LOW (ref 3.77–5.28)
RDW: 14.8 % (ref 11.7–15.4)
WBC: 2.9 10*3/uL — ABNORMAL LOW (ref 3.4–10.8)

## 2023-04-18 LAB — BASIC METABOLIC PANEL
BUN/Creatinine Ratio: 15 (ref 12–28)
BUN: 25 mg/dL (ref 8–27)
CO2: 31 mmol/L — ABNORMAL HIGH (ref 20–29)
Calcium: 8.9 mg/dL (ref 8.7–10.3)
Chloride: 94 mmol/L — ABNORMAL LOW (ref 96–106)
Creatinine, Ser: 1.66 mg/dL — ABNORMAL HIGH (ref 0.57–1.00)
Glucose: 139 mg/dL — ABNORMAL HIGH (ref 70–99)
Potassium: 3.9 mmol/L (ref 3.5–5.2)
Sodium: 138 mmol/L (ref 134–144)
eGFR: 30 mL/min/{1.73_m2} — ABNORMAL LOW (ref >59)

## 2023-04-21 ENCOUNTER — Ambulatory Visit: Payer: PPO | Admitting: Physician Assistant

## 2023-04-21 ENCOUNTER — Ambulatory Visit: Payer: PPO

## 2023-04-21 NOTE — Progress Notes (Signed)
[facility] [address], [place] [address]   CLINIC:  Medical Oncology/Hematology  PCP:  [name], DO [address] [place] [address] [phone]   REASON FOR VISIT:  Follow-up for normocytic anemia + leukopenia/thrombocytopenia + history of breast cancer  BRIEF ONCOLOGIC HISTORY:   Oncology History Overview Note  Cancer Staging Malignant neoplasm of lower-outer quadrant of right breast of female, estrogen receptor positive (HCC) Staging form: Breast, AJCC 8th Edition - Clinical stage from 02/22/2018: Stage IA (cT1c, cN0, cM0, G1, ER+, PR+, HER2-) - Signed by Malachy Mood, MD on 03/02/2018    Malignant neoplasm of lower-outer quadrant of right breast of female, estrogen receptor positive (HCC)  02/12/2018 Mammogram   She had routine screening bilateral mammography on 02/12/2018 at [facility] with results showing: indeterminate irregular mass in the right breast.    02/16/2018 Mammogram   She underwent right diagnostic mammography with tomography and right breast ultrasonography at [facility] on 02/16/2018 showing: 1.1 cm irregular mass at the 8 O'clock position on the right breast   02/22/2018 Pathology Results   Diagnosis 1. Breast, right, needle core biopsy, lateral, 8 o'clock - INVASIVE AND IN SITU DUCTAL CARCINOMA. - SEE COMMENT. 2. Breast, right, needle core biopsy, 11 o'clock - FIBROCYSTIC CHANGES. - USUAL DUCTAL HYPERPLASIA. - THERE IS NO EVIDENCE OF MALIGNANCY.   02/22/2018 Receptors her2   IMMUNOHISTOCHEMICAL AND MORPHOMETRIC ANALYSIS PERFORMED MANUALLY Estrogen Receptor: 100%, POSITIVE, STRONG STAINING INTENSITY Progesterone Receptor: 40%, POSITIVE, MODERATE STAINING INTENSITY Proliferation Marker Ki67: 1% Her2 Negative   02/22/2018 Cancer Staging   Staging form: Breast, AJCC 8th Edition - Clinical stage from 02/22/2018: Stage IA (cT1c, cN0, cM0, G1, ER+, PR+, HER2-) - Signed by Malachy Mood, MD on 03/02/2018    02/25/2018 Initial Diagnosis    Malignant neoplasm of lower-outer quadrant of right breast of female, estrogen receptor positive (HCC)     CANCER STAGING:  Cancer Staging  Malignant neoplasm of lower-outer quadrant of right breast of female, estrogen receptor positive (HCC) Staging form: Breast, AJCC 8th Edition - Clinical stage from 02/22/2018: Stage IA (cT1c, cN0, cM0, G1, ER+, PR+, HER2-) - Signed by Malachy Mood, MD on 03/02/2018   INTERVAL HISTORY:   [name], a 89 y.o. female, returns for routine follow-up of her breast cancer and pancytopenia. Xianna was last seen on 10/07/2022 by Rojelio Brenner PA-C.  Since her last visit, she was hospitalized from 04/02/2023 through 04/04/2023 due to acute on chronic diastolic CHF with acute hypoxic respiratory failure, for which she is now on around-the-clock supplemental oxygen.  She denies any other hospitalizations, surgeries, or changes to her baseline health status.    BREAST CANCER: She denies any symptoms of recurrence such as new lumps, bone pain,  or abdominal pain.  She has no new headaches, seizures, or focal neurologic deficits.  No B symptoms such as fever, chills, night sweats, unintentional weight loss.  She continues to take letrozole, which she is tolerating well without any hot flashes, mood swings, or abnormal pain.  She reports that she is tolerating Prolia injections well and denies any bone pain, fractures, or jaw pain.  PANCYTOPENIA: She has significant bruising of the extremities.  She reports that she had more bleeding than expected after excision of left skin lesion last month.  She denies any epistaxis, hematochezia, or melena.  She has dyspnea on exertion related to her CHF.  She reports that prior to being placed on supplemental oxygen she was having  episodes of exertional chest pressure, which has resolved after being placed on supplemental O2.  She has worsening fatigue.  She denies any B symptoms or recent infections.  She continues to take daily iron  supplement with orange juice.  She reports 50% energy and 100% appetite.  She is maintaining stable weight at this time.  ASSESSMENT & PLAN:  1.  History of stage I right-sided breast cancer (2019) - She had a mammogram on 02/16/2018 which showed B RADS category 0 incomplete.  She was referred for a biopsy. - Right breast biopsy on 02/22/2018 consistent with IDC. - Right lumpectomy on 04/08/2018, IDC, 1.1 cm, grade 1, margins negative, ER/PR positive, HER2 negative, Ki-67 1%. - She declined adjuvant radiation therapy. - Letrozole started on 05/28/2018, which she is tolerating well  - BCI testing (see Media file from 10/22/2022) showed NO benefit from endocrine therapy extended beyond 5 years, as she has 1.9% risk of late distant recurrence regardless of whether 5 to 10 years of aromatase inhibitor treatment is given. - Most recent mammogram (03/24/2023): BI-RADS Category 2, benign.  No evidence of breast malignancy in either breast. - Physical exam negative for any masses, nodules, or lymphadenopathy in either breast - History/ROS negative for any "red flag" symptoms of recurrent breast cancer - Most recent CMP (04/06/2023) shows normal LFTs  - PLAN: Patient can discontinue letrozole after completing treatment for the month of October 2024.  This is based on recommendations from BCI testing, which were discussed with patient in detail.  She is agreeable with discontinuing letrozole at this time, particularly in the setting of osteopenia related to aromatase inhibitor therapy.   - Annual mammogram and next breast exam due August 2025.   2.  PANCYTOPENIA - Leukopenia and thrombocytopenia since 2015 in addition to anemia that is in part related to CKD, relative iron deficiency, and B12 deficiency. - Moderate to high suspicion for MDS or other infiltrative bone marrow disorder, but bone marrow biopsy has been declined. - CT abdomen/pelvis (01/16/2021): Normal-sized spleen.  Liver without any noted  abnormalities.   - Folate normal.  Borderline B12 deficiency noted on 12/04/2021 with normal B12 but elevated methylmalonic acid 605 - Rheumatoid factor and ANA negative. - History of acute blood loss anemia requiring 2 units PRBC in January 2023. - EGD (09/01/2021) showed mild chronic gastritis and nonbleeding duodenal ulcer - She is taking ferrous sulfate 325 mg and vitamin B12 500 mcg daily.  - No bright red blood per rectum or melena. Admits to easy bruising, but denies petechial rash.  No B symptoms, frequent infections, or lymphadenopathy. - Labs from 04/06/2023 show severe worsening of anemia with Hgb 8.4/MCV 88.4.  (Labs from PCP on 04/17/2023 showed platelets of 66, but with clumping/aggregation of platelets) Normal ferritin 287, iron saturation 14%. Vitamin B12 1088, MMA elevated 669. Normal LDH.  Creatinine 1.76/GFR 28. - Repeat CBC/D today (04/22/2023): Hgb 8.2/MCV 89.0, WBC 2.0, platelets 105 - DIFFERENTIAL DIAGNOSIS includes probable MDS with multifactorial anemia from CKD, relative iron deficiency, B12 deficiency.   - PLAN: Extensive discussion with patient and family member (granddaughter Morrie Sheldon) in clinic today (04/22/2023) which covered the following: Moderate to high clinical suspicion for MDS, but unable to definitively diagnose this without bone marrow biopsy.  Unable to rule out other bone marrow infiltrative processes (including possibility of lymphoma or leukemia) without bone marrow biopsy. Due to patient's advanced age and other comorbidities, they wish to decline bone marrow biopsy at this time. Will proceed with conservative treatment  by starting ESA injections, which is an acceptable treatment for anemia from CKD as well as anemia from possible MDS.  - We will initiate Retacrit/Procrit 10,000 units every 2 weeks (first dose today 04/22/2023) for anemia of CKD and clinical suspicion for MDS We discussed potential side effects and risk of adverse reaction, and patient is agreeable  to proceed. Hypertension noted, but still within treatment parameters at today's visit (BP 179/54). Hypertension managed by PCP and nephrologist.  Patient takes as needed clonidine at home.  - Continue daily iron tablet daily and vitamin B12 500 mcg daily. - Continue close monitoring and ongoing discussion regarding additional testing and treatment as appropriate. - RTC with labs (CBC/D, CMP, LDH, ferritin, iron/TIBC) and office visit in 4 to 6 weeks  3.  Osteopenia - She is at risk for breast cancer associated bone loss in the setting of aromatase inhibitors since 2019 - DEXA scan done 03/07/2019 showed T-score -1.0 - DEXA scan from 04/23/2021 via [facility] was consistent with osteopenia (T-score -1.7) - Patient takes vitamin D and calcium supplements at home, with most recent vitamin D normal at 59.8 (09/30/2022) - FRAX from 04/23/2021 showed 10-year probability of hip fracture at 10.3% with 10-year risk of major osteoporotic fracture 17.2%, therefore started on Prolia in February 2024. - Tolerating Prolia well.  No fractures, bone pain, or jaw pain since last visit. - PLAN: Continue Prolia every 6 months (given today 04/22/23 - next due March 2025)  - She has upcoming bone density scan is scheduled by PCP. -- Continue calcium and vitamin D supplements with weight-bearing exercise as tolerated  PLAN SUMMARY: >> CBC/BB sample + Retacrit 10,000 units every 2 weeks (first dose today) >> Same-day labs (CBC/D, CMP, LDH, ferritin, iron/TIBC, BB sample) + injection + OFFICE visit in 4 to 6 weeks  - Next Prolia due March 2025 - Next mammogram and breast exam due August 2025    REVIEW OF SYSTEMS:   Review of Systems  Constitutional:  Positive for fatigue. Negative for appetite change, chills, diaphoresis, fever and unexpected weight change.  HENT:   Negative for lump/mass and nosebleeds.   Eyes:  Negative for eye problems.  Respiratory:  Positive for shortness of breath. Negative for cough and  hemoptysis.   Cardiovascular:  Positive for chest pain (if not wearing oxygen). Negative for leg swelling and palpitations.  Gastrointestinal:  Negative for abdominal pain, blood in stool, constipation, diarrhea, nausea and vomiting.  Genitourinary:  Negative for hematuria.   Skin: Negative.   Neurological:  Negative for dizziness, headaches and light-headedness.  Hematological:  Bruises/bleeds easily.    PHYSICAL EXAM:   Performance status (ECOG): 2 - Symptomatic, <50% confined to bed  There were no vitals filed for this visit. Wt Readings from Last 3 Encounters:  04/17/23 110 lb (49.9 kg)  04/04/23 108 lb 7.5 oz (49.2 kg)  03/18/23 107 lb (48.5 kg)   Physical Exam Constitutional:      Appearance: Normal appearance.     Comments: Thin body habitus  HENT:     Head: Normocephalic and atraumatic.     Mouth/Throat:     Mouth: Mucous membranes are moist.  Eyes:     Extraocular Movements: Extraocular movements intact.     Pupils: Pupils are equal, round, and reactive to light.  Cardiovascular:     Rate and Rhythm: Normal rate and regular rhythm.     Pulses: Normal pulses.     Heart sounds: Murmur heard.  Pulmonary:  Effort: Pulmonary effort is normal.     Breath sounds: Decreased air movement present.  Chest:  Breasts:    Right: No swelling, bleeding, inverted nipple, mass, nipple discharge, skin change or tenderness.     Left: No swelling, bleeding, inverted nipple, mass, nipple discharge, skin change or tenderness.     Comments: LOQ lumpectomy scar WNL.  Abdominal:     General: Bowel sounds are normal.     Palpations: Abdomen is soft.     Tenderness: There is no abdominal tenderness.  Musculoskeletal:        General: No swelling.     Right lower leg: No edema.     Left lower leg: No edema.  Lymphadenopathy:     Cervical: No cervical adenopathy.  Skin:    General: Skin is warm and dry.     Findings: Bruising (extremities) present.  Neurological:     General: No  focal deficit present.     Mental Status: She is alert and oriented to person, place, and time.  Psychiatric:        Mood and Affect: Mood normal.        Behavior: Behavior normal.    PAST MEDICAL/SURGICAL HISTORY:  Past Medical History:  Diagnosis Date   Anemia    Arthritis    Asthma    Atrial fibrillation (HCC)    Basal cell carcinoma 02/06/2009   Left ear targus- ([facility])   Basal cell carcinoma 09/28/2008   Right back-([id number])   CKD (chronic kidney disease), stage III (HCC)    Heart murmur    Hyperlipidemia    Hypertension    Hypothyroidism    Nodule of right lung    Right upper lobe   Osteopenia due to cancer therapy 10/07/2022   Pneumonia    PONV (postoperative nausea and vomiting)    Renal insufficiency    Chronic   Renal vascular disease    Right renal artery stenosis (HCC) 05/09/2015   Squamous cell carcinoma of skin 09/07/2019   KA-Left shin-txpbx   Vertigo    Past Surgical History:  Procedure Laterality Date   BIOPSY  04/09/2021   Procedure: BIOPSY;  Surgeon: Dolores Frame, MD;  [facility];  Service: Gastroenterology;;   BREAST LUMPECTOMY WITH RADIOACTIVE SEED LOCALIZATION Right 04/08/2018   Procedure: BREAST LUMPECTOMY WITH RADIOACTIVE SEED LOCALIZATION;  Surgeon: Emelia Loron, MD;  Location: [facility] OR;  Service: General;  Laterality: Right;   COLONOSCOPY  2016   diverticulosis, hemorrhoids   ESOPHAGOGASTRODUODENOSCOPY  2016   normal   ESOPHAGOGASTRODUODENOSCOPY (EGD) WITH PROPOFOL N/A 04/09/2021   Procedure: ESOPHAGOGASTRODUODENOSCOPY (EGD) WITH PROPOFOL;  Surgeon: Dolores Frame, MD;  [facility]: [facility];  Service: Gastroenterology;  Laterality: N/A;  12:45   ESOPHAGOGASTRODUODENOSCOPY (EGD) WITH PROPOFOL N/A 09/01/2021   Procedure: ESOPHAGOGASTRODUODENOSCOPY (EGD) WITH PROPOFOL;  Surgeon: Lanelle Bal, DO;  [facility]: [facility];  Service: Endoscopy;  Laterality: N/A;   IR GENERIC HISTORICAL  08/29/2016   IR  US GUIDE VASC ACCESS RIGHT 08/29/2016 [facility]   IR GENERIC HISTORICAL  08/29/2016   IR RENAL BILAT S&I MOD SED 08/29/2016 [facility]   KNEE ARTHROSCOPY Right    2019   THYROIDECTOMY, PARTIAL Right 1981   middle lobe removed 1st, then right lobe removed 7-8 years later (approx. 1988)    SOCIAL HISTORY:  Social History   Socioeconomic History   Marital status: Widowed    Spouse name: Not on file   Number of children: 2  Years of education: 65   Highest education level: Not on file  Occupational History   Occupation: Retired  Tobacco Use   Smoking status: Never   Smokeless tobacco: Never  Vaping Use   Vaping status: Never Used  Substance and Sexual Activity   Alcohol use: No   Drug use: No   Sexual activity: Not Currently  Other Topics Concern   Not on file  Social History Narrative   Lives alone, but her daughter stays there at night   Ambidextrous (writes with left hand).   No caffeine use.   Social Determinants of Health   Financial Resource Strain: Low Risk  (12/15/2022)   Overall Financial Resource Strain (CARDIA)    Difficulty of Paying Living Expenses: Not hard at all  Food Insecurity: No Food Insecurity (04/06/2023)   Hunger Vital Sign    Worried About Running Out of Food in the Last Year: Never true    Ran Out of Food in the Last Year: Never true  Transportation Needs: No Transportation Needs (04/02/2023)   PRAPARE - Administrator, Civil Service (Medical): No    Lack of Transportation (Non-Medical): No  Physical Activity: Inactive (12/15/2022)   Exercise Vital Sign    Days of Exercise per Week: 0 days    Minutes of Exercise per Session: 0 min  Stress: No Stress Concern Present (12/15/2022)   [employer] of Occupational Health - Occupational Stress Questionnaire    Feeling of Stress : Not at all  Social Connections: Moderately Isolated (12/15/2022)   Social Connection and Isolation Panel [NHANES]    Frequency of Communication with  Friends and Family: More than three times a week    Frequency of Social Gatherings with Friends and Family: Three times a week    Attends Religious Services: More than 4 times per year    Active Member of Clubs or Organizations: No    Attends Banker Meetings: Never    Marital Status: Widowed  Intimate Partner Violence: Not At Risk (04/02/2023)   Humiliation, Afraid, Rape, and Kick questionnaire    Fear of Current or Ex-Partner: No    Emotionally Abused: No    Physically Abused: No    Sexually Abused: No    FAMILY HISTORY:  Family History  Problem Relation Age of Onset   Thyroid disease Mother    Stroke Father    Hypertension Brother    Lung cancer Maternal Uncle    Cancer Maternal Grandmother        Liver   Lung cancer Maternal Uncle    Hypertension Daughter    Hypercholesterolemia Daughter    Hypercholesterolemia Daughter     CURRENT MEDICATIONS:  Current Outpatient Medications  Medication Sig Dispense Refill   acetaminophen (TYLENOL) 325 MG tablet Take 325 mg by mouth daily as needed for moderate pain or headache.     atorvastatin (LIPITOR) 20 MG tablet TAKE 1/2 TABLET BY MOUTH DAILY (Patient taking differently: Take 10 mg by mouth at bedtime.) 45 tablet 1   bisoprolol (ZEBETA) 5 MG tablet TAKE 1 TABLET (5 MG TOTAL) BY MOUTH DAILY. 90 tablet 3   cholecalciferol (VITAMIN D) 1000 units tablet Take 1,000 Units by mouth daily.     cloNIDine (CATAPRES) 0.1 MG tablet Take 1 tablet (0.1 mg total) by mouth as directed. May take one tablet as needed with systolic blood pressure greater than 170 30 tablet 1   cyanocobalamin (VITAMIN B12) 500 MCG tablet Take 1 tablet (500 mcg total)  by mouth daily.     feeding supplement (ENSURE ENLIVE / ENSURE PLUS) LIQD Take 237 mLs by mouth 2 (two) times daily between meals. (Patient taking differently: Take 237 mLs by mouth daily.) 237 mL 12   ferrous sulfate 325 (65 FE) MG tablet Take 325 mg by mouth daily with breakfast.      flecainide (TAMBOCOR) 50 MG tablet TAKE 1 TABLET BY MOUTH TWICE A DAY 180 tablet 3   furosemide (LASIX) 40 MG tablet Take 1 tablet (40 mg total) by mouth daily. 30 tablet 11   hydrALAZINE (APRESOLINE) 50 MG tablet Take 1 tablet (50 mg total) by mouth 2 (two) times daily. 60 tablet 3   letrozole (FEMARA) 2.5 MG tablet TAKE 1 TABLET BY MOUTH EVERY DAY 90 tablet 4   levothyroxine (SYNTHROID) 75 MCG tablet TAKE 1 TABLET BY MOUTH EVERY DAY 90 tablet 3   losartan (COZAAR) 25 MG tablet Take 25 mg by mouth daily.     mirtazapine (REMERON) 7.5 MG tablet TAKE 1 TABLET BY MOUTH EVERYDAY AT BEDTIME 90 tablet 3   Multiple Vitamin (MULTIVITAMIN) tablet Take 1 tablet by mouth daily.     mupirocin ointment (BACTROBAN) 2 % Apply 1 Application topically 2 (two) times daily.     pantoprazole (PROTONIX) 40 MG tablet TAKE 1 TABLET (40 MG TOTAL) BY MOUTH TWICE A DAY BEFORE MEALS (Patient taking differently: Take 40 mg by mouth daily.) 180 tablet 1   No current facility-administered medications for this visit.    ALLERGIES:  Allergies  Allergen Reactions   Amlodipine Other (See Comments)    edema   Lisinopril     cough   Xeroform Occlusive Gauze Strip [Bismuth Tribromoph-Petrolatum]     Has to have no stick tape   Isosorbide Nitrate Nausea Only   Sulfonamide Derivatives Nausea And Vomiting and Rash   Terazosin Hives and Nausea Only    LABORATORY DATA:  I have reviewed the labs as listed.     Latest Ref Rng & Units 04/17/2023    9:52 AM 04/06/2023    9:37 AM 04/04/2023    5:03 AM  CBC  WBC 3.4 - 10.8 x10E3/uL 2.9  2.1  2.2   Hemoglobin 11.1 - 15.9 g/dL 8.5  8.4  7.4   Hematocrit 34.0 - 46.6 % 28.2  27.5  23.6   Platelets 150 - 450 x10E3/uL 66  114  82       Latest Ref Rng & Units 04/17/2023    9:52 AM 04/06/2023    9:37 AM 04/04/2023    5:03 AM  CMP  Glucose 70 - 99 mg/dL 409  811  914   BUN 8 - 27 mg/dL 25  31  35   Creatinine 0.57 - 1.00 mg/dL 7.82  9.56  2.13   Sodium 134 - 144 mmol/L 138  133   133   Potassium 3.5 - 5.2 mmol/L 3.9  3.7  4.1   Chloride 96 - 106 mmol/L 94  91  93   CO2 20 - 29 mmol/L 31  33  33   Calcium 8.7 - 10.3 mg/dL 8.9  8.5  8.1   Total Protein 6.5 - 8.1 g/dL  6.7    Total Bilirubin 0.3 - 1.2 mg/dL  0.5    Alkaline Phos 38 - 126 U/L  44    AST 15 - 41 U/L  21    ALT 0 - 44 U/L  12      DIAGNOSTIC IMAGING:  I  have independently reviewed the scans and discussed with the patient. DG Chest 2 View  Result Date: 04/17/2023 CLINICAL DATA:  CHF.  Hypoxia. EXAM: CHEST - 2 VIEW COMPARISON:  Chest radiographs 04/02/2023 FINDINGS: The cardiac silhouette remains enlarged. Aortic atherosclerosis is noted. The lungs remain hyperinflated. Prominence of the interstitial markings bilaterally is similar to the prior study. A small right pleural effusion has mildly enlarged. There is a persistent trace left pleural effusion. A new small opacity in the right mid lung may reflect a small amount of fluid in the minor fissure or atelectasis. No pneumothorax is identified. No acute osseous abnormality is seen. IMPRESSION: Cardiomegaly and similar interstitial densities which could reflect mild edema. Increased, small right pleural effusion an unchanged trace left pleural effusion. Electronically Signed   By: Sebastian Ache M.D.   On: 04/17/2023 08:58   ECHOCARDIOGRAM COMPLETE  Result Date: 04/03/2023    ECHOCARDIOGRAM REPORT   Patient Name:   [name] Date of Exam: 04/03/2023 Medical Rec #:  [id number]      Height:       64.0 in Accession #:    [id number]     Weight:       106.7 lb Date of Birth:  24-Jun-1934      BSA:          1.498 m Patient Age:    89 years       BP:           106/63 mmHg Patient Gender: F              HR:           60 bpm. [facility] Procedure: 2D Echo, Color Doppler and Cardiac Doppler Indications:     CHF-Acute Diastolic I50.31  History:         Patient has prior history of Echocardiogram examinations, most                  recent 08/29/2021. CHF,  Aortic Valve Disease, Arrythmias:Atrial                  Fibrillation, Signs/Symptoms:Murmur; Risk Factors:Hypertension,                  Non-Smoker and Dyslipidemia.  Sonographer:     Aron Baba Referring Phys:  [id number] Starleen Arms Diagnosing Phys: Dina Rich MD IMPRESSIONS  1. Left ventricular ejection fraction, by estimation, is 60 to 65%. The left ventricle has normal function. The left ventricle has no regional wall motion abnormalities. There is mild left ventricular hypertrophy. Left ventricular diastolic parameters are consistent with Grade II diastolic dysfunction (pseudonormalization). Elevated left atrial pressure.  2. Right ventricular systolic function is normal. The right ventricular size is mildly enlarged. There is severely elevated pulmonary artery systolic pressure.  3. Left atrial size was severely dilated.  4. Right atrial size was severely dilated.  5. The mitral valve is abnormal. Mild mitral valve regurgitation. Moderate mitral stenosis. The mean mitral valve gradient is 6.0 mmHg.HR of 60  6. The tricuspid valve is abnormal.  7. The aortic valve has an indeterminant number of cusps. There is moderate calcification of the aortic valve. There is moderate thickening of the aortic valve. Aortic valve regurgitation is mild. Moderate to severe aortic valve stenosis. Aortic valve mean gradient measures 34.0 mmHg. Aortic valve peak gradient measures 48.5 mmHg. Aortic valve area, by VTI measures 0.93 cm.DI 0.41  8. The inferior vena cava is dilated in  size with <50% respiratory variability, suggesting right atrial pressure of 15 mmHg. FINDINGS  Left Ventricle: Left ventricular ejection fraction, by estimation, is 60 to 65%. The left ventricle has normal function. The left ventricle has no regional wall motion abnormalities. The left ventricular internal cavity size was normal in size. There is  mild left ventricular hypertrophy. Left ventricular diastolic parameters are consistent with  Grade II diastolic dysfunction (pseudonormalization). Elevated left atrial pressure. Right Ventricle: The right ventricular size is mildly enlarged. Right vetricular wall thickness was not well visualized. Right ventricular systolic function is normal. There is severely elevated pulmonary artery systolic pressure. The tricuspid regurgitant velocity is 4.08 m/s, and with an assumed right atrial pressure of 15 mmHg, the estimated right ventricular systolic pressure is 81.6 mmHg. Left Atrium: Left atrial size was severely dilated. Right Atrium: Right atrial size was severely dilated. Pericardium: There is no evidence of pericardial effusion. Mitral Valve: The mitral valve is abnormal. There is mild thickening of the mitral valve leaflet(s). There is mild calcification of the mitral valve leaflet(s). Mild mitral annular calcification. Mild mitral valve regurgitation. Moderate mitral valve stenosis. MV peak gradient, 17.5 mmHg. The mean mitral valve gradient is 6.0 mmHg. Tricuspid Valve: The tricuspid valve is abnormal. Tricuspid valve regurgitation is mild . No evidence of tricuspid stenosis. Aortic Valve: The aortic valve has an indeterminant number of cusps. There is moderate calcification of the aortic valve. There is moderate thickening of the aortic valve. There is moderate aortic valve annular calcification. Aortic valve regurgitation is mild. Moderate to severe aortic stenosis is present. Aortic valve mean gradient measures 34.0 mmHg. Aortic valve peak gradient measures 48.5 mmHg. Aortic valve area, by VTI measures 0.93 cm. Pulmonic Valve: The pulmonic valve was not well visualized. Pulmonic valve regurgitation is mild. No evidence of pulmonic stenosis. Aorta: The aortic root and ascending aorta are structurally normal, with no evidence of dilitation. Venous: The inferior vena cava is dilated in size with less than 50% respiratory variability, suggesting right atrial pressure of 15 mmHg. IAS/Shunts: The  interatrial septum was not well visualized.  LEFT VENTRICLE PLAX 2D LVIDd:         4.40 cm   Diastology LVIDs:         2.80 cm   LV e' medial:    5.35 cm/s LV PW:         1.20 cm   LV E/e' medial:  29.9 LV IVS:        1.10 cm   LV e' lateral:   4.70 cm/s LVOT diam:     1.70 cm   LV E/e' lateral: 34.0 LV SV:         87 LV SV Index:   58 LVOT Area:     2.27 cm  RIGHT VENTRICLE RV S prime:     12.60 cm/s TAPSE (M-mode): 2.6 cm LEFT ATRIUM             Index        RIGHT ATRIUM           Index LA diam:        3.80 cm 2.54 cm/m   RA Area:     22.30 cm LA Vol (A2C):   93.2 ml 62.22 ml/m  RA Volume:   71.40 ml  47.67 ml/m LA Vol (A4C):   55.7 ml 37.18 ml/m LA Biplane Vol: 74.7 ml 49.87 ml/m  AORTIC VALVE  PULMONIC VALVE AV Area (Vmax):    0.90 cm      PR End Diast Vel: 9.18 msec AV Area (Vmean):   0.88 cm AV Area (VTI):     0.93 cm AV Vmax:           348.25 cm/s AV Vmean:          252.500 cm/s AV VTI:            0.931 m AV Peak Grad:      48.5 mmHg AV Mean Grad:      34.0 mmHg LVOT Vmax:         138.38 cm/s LVOT Vmean:        98.000 cm/s LVOT VTI:          0.383 m LVOT/AV VTI ratio: 0.41  AORTA Ao Root diam: 2.70 cm Ao Asc diam:  3.30 cm MITRAL VALVE                TRICUSPID VALVE MV Area (PHT): 3.12 cm     TR Peak grad:   66.6 mmHg MV Peak grad:  17.5 mmHg    TR Vmax:        408.00 cm/s MV Mean grad:  6.0 mmHg MV Vmax:       2.09 m/s     SHUNTS MV Vmean:      114.0 cm/s   Systemic VTI:  0.38 m MV Decel Time: 243 msec     Systemic Diam: 1.70 cm MR Peak grad: 51.6 mmHg MR Vmax:      359.00 cm/s MV E velocity: 160.00 cm/s MV A velocity: 112.00 cm/s MV E/A ratio:  1.43 Dina Rich MD Electronically signed by Dina Rich MD Signature Date/Time: 04/03/2023/1:32:28 PM    Final (Updated)    DG Chest 2 View  Result Date: 04/02/2023 CLINICAL DATA:  Chest pain. Shortness of breath for a few days. History of CHF EXAM: CHEST - 2 VIEW COMPARISON:  X-ray 09/20/2022 FINDINGS: Hyperinflation.  Enlarged cardiopericardial silhouette. Mild interstitial changes. Possible component of edema. Tiny pleural effusions are seen, right-greater-than-left. No pneumothorax. Calcified aorta IMPRESSION: Enlarged heart. New tiny effusions. Interstitial changes seen, possible mild edema. No consolidation Recommend follow up Electronically Signed   By: Karen Kays M.D.   On: 04/02/2023 15:58   MM DIAG BREAST TOMO BILATERAL  Result Date: 03/24/2023 CLINICAL DATA:  History of RIGHT lumpectomy in 2019. EXAM: DIGITAL DIAGNOSTIC BILATERAL MAMMOGRAM WITH TOMOSYNTHESIS AND CAD TECHNIQUE: Bilateral digital diagnostic mammography and breast tomosynthesis was performed. The images were evaluated with computer-aided detection. COMPARISON:  Previous exam(s). ACR Breast Density Category c: The breasts are heterogeneously dense, which may obscure small masses. FINDINGS: No suspicious mass, distortion, or microcalcifications are identified to suggest presence of malignancy. Minimal postoperative changes are present in the RIGHT breast. IMPRESSION: No mammographic evidence for malignancy. RECOMMENDATION: Per protocol, as the patient is now 2 or more years status post lumpectomy, she may return to annual screening mammography in 1 year. However, given the history of breast cancer, the patient remains eligible for annual diagnostic mammography if preferred. I have discussed the findings and recommendations with the patient. If applicable, a reminder letter will be sent to the patient regarding the next appointment. BI-RADS CATEGORY  2: Benign. Electronically Signed   By: Norva Pavlov M.D.   On: 03/24/2023 10:22     WRAP UP:  All questions were answered. The patient knows to call the clinic with any problems, questions or concerns.  Medical decision  making: Moderate  Time spent on visit: I spent 20 minutes counseling the patient face to face. The total time spent in the appointment was 30 minutes and more than 50% was on  counseling.  Carnella Guadalajara, PA-C  04/22/23 4:44 PM

## 2023-04-22 ENCOUNTER — Inpatient Hospital Stay: Payer: PPO

## 2023-04-22 ENCOUNTER — Inpatient Hospital Stay: Payer: PPO | Attending: Physician Assistant | Admitting: Physician Assistant

## 2023-04-22 ENCOUNTER — Ambulatory Visit: Payer: PPO | Admitting: Pulmonary Disease

## 2023-04-22 ENCOUNTER — Encounter: Payer: Self-pay | Admitting: Pulmonary Disease

## 2023-04-22 VITALS — BP 117/52 | HR 66 | Ht 64.0 in | Wt 108.0 lb

## 2023-04-22 VITALS — BP 179/54 | HR 53 | Temp 98.0°F | Resp 18 | Wt 109.1 lb

## 2023-04-22 DIAGNOSIS — C50511 Malignant neoplasm of lower-outer quadrant of right female breast: Secondary | ICD-10-CM | POA: Diagnosis not present

## 2023-04-22 DIAGNOSIS — Z79899 Other long term (current) drug therapy: Secondary | ICD-10-CM | POA: Insufficient documentation

## 2023-04-22 DIAGNOSIS — J849 Interstitial pulmonary disease, unspecified: Secondary | ICD-10-CM

## 2023-04-22 DIAGNOSIS — Z79811 Long term (current) use of aromatase inhibitors: Secondary | ICD-10-CM | POA: Diagnosis not present

## 2023-04-22 DIAGNOSIS — Z170 Estrogen receptor positive status [ER+]: Secondary | ICD-10-CM | POA: Diagnosis not present

## 2023-04-22 DIAGNOSIS — D649 Anemia, unspecified: Secondary | ICD-10-CM

## 2023-04-22 DIAGNOSIS — E538 Deficiency of other specified B group vitamins: Secondary | ICD-10-CM | POA: Insufficient documentation

## 2023-04-22 DIAGNOSIS — I350 Nonrheumatic aortic (valve) stenosis: Secondary | ICD-10-CM

## 2023-04-22 DIAGNOSIS — D61818 Other pancytopenia: Secondary | ICD-10-CM | POA: Diagnosis not present

## 2023-04-22 DIAGNOSIS — J9611 Chronic respiratory failure with hypoxia: Secondary | ICD-10-CM | POA: Diagnosis not present

## 2023-04-22 DIAGNOSIS — M8580 Other specified disorders of bone density and structure, unspecified site: Secondary | ICD-10-CM | POA: Diagnosis not present

## 2023-04-22 LAB — CBC
HCT: 26.7 % — ABNORMAL LOW (ref 36.0–46.0)
Hemoglobin: 8.2 g/dL — ABNORMAL LOW (ref 12.0–15.0)
MCH: 27.3 pg (ref 26.0–34.0)
MCHC: 30.7 g/dL (ref 30.0–36.0)
MCV: 89 fL (ref 80.0–100.0)
Platelets: 105 10*3/uL — ABNORMAL LOW (ref 150–400)
RBC: 3 MIL/uL — ABNORMAL LOW (ref 3.87–5.11)
RDW: 15.9 % — ABNORMAL HIGH (ref 11.5–15.5)
WBC: 2 10*3/uL — ABNORMAL LOW (ref 4.0–10.5)
nRBC: 0 % (ref 0.0–0.2)

## 2023-04-22 LAB — SAMPLE TO BLOOD BANK

## 2023-04-22 MED ORDER — DENOSUMAB 60 MG/ML ~~LOC~~ SOSY
60.0000 mg | PREFILLED_SYRINGE | Freq: Once | SUBCUTANEOUS | Status: AC
  Administered 2023-04-22: 60 mg via SUBCUTANEOUS
  Filled 2023-04-22: qty 1

## 2023-04-22 MED ORDER — EPOETIN ALFA-EPBX 10000 UNIT/ML IJ SOLN
10000.0000 [IU] | Freq: Once | INTRAMUSCULAR | Status: AC
  Administered 2023-04-22: 10000 [IU] via SUBCUTANEOUS
  Filled 2023-04-22: qty 1

## 2023-04-22 NOTE — Patient Instructions (Signed)
[facility]  Discharge Instructions: Thank you for choosing [facility] to provide your oncology and hematology care.  If you have a lab appointment with the Cancer Center - please note that after April 8th, 2024, all labs will be drawn in the cancer center.  You do not have to check in or register with the main entrance as you have in the past but will complete your check-in in the cancer center.  Wear comfortable clothing and clothing appropriate for easy access to any Portacath or PICC line.   We strive to give you quality time with your provider. You may need to reschedule your appointment if you arrive late (15 or more minutes).  Arriving late affects you and other patients whose appointments are after yours.  Also, if you miss three or more appointments without notifying the office, you may be dismissed from the clinic at the provider's discretion.      For prescription refill requests, have your pharmacy contact our office and allow 72 hours for refills to be completed.    Today you received the following chemotherapy and/or immunotherapy agents Prolia/Retacrit      To help prevent nausea and vomiting after your treatment, we encourage you to take your nausea medication as directed.  BELOW ARE SYMPTOMS THAT SHOULD BE REPORTED IMMEDIATELY: *FEVER GREATER THAN 100.4 F (38 C) OR HIGHER *CHILLS OR SWEATING *NAUSEA AND VOMITING THAT IS NOT CONTROLLED WITH YOUR NAUSEA MEDICATION *UNUSUAL SHORTNESS OF BREATH *UNUSUAL BRUISING OR BLEEDING *URINARY PROBLEMS (pain or burning when urinating, or frequent urination) *BOWEL PROBLEMS (unusual diarrhea, constipation, pain near the anus) TENDERNESS IN MOUTH AND THROAT WITH OR WITHOUT PRESENCE OF ULCERS (sore throat, sores in mouth, or a toothache) UNUSUAL RASH, SWELLING OR PAIN  UNUSUAL VAGINAL DISCHARGE OR ITCHING   Items with * indicate a potential emergency and should be followed up as soon as possible or go to  the Emergency Department if any problems should occur.  Please show the CHEMOTHERAPY ALERT CARD or IMMUNOTHERAPY ALERT CARD at check-in to the Emergency Department and triage nurse.  Should you have questions after your visit or need to cancel or reschedule your appointment, please contact [facility] [phone]  and follow the prompts.  Office hours are 8:00 a.m. to 4:30 p.m. Monday - Friday. Please note that voicemails left after 4:00 p.m. may not be returned until the following business day.  We are closed weekends and major holidays. You have access to a nurse at all times for urgent questions. Please call the main number to the clinic [phone] and follow the prompts.  For any non-urgent questions, you may also contact your provider using MyChart. We now offer e-Visits for anyone 46 and older to request care online for non-urgent symptoms. For details visit [email or web address].   Also download the MyChart app! Go to the app store, search "MyChart", open the app, select , and log in with your MyChart username and password.

## 2023-04-22 NOTE — Progress Notes (Signed)
Subjective:    Patient ID: [name], female    DOB: Jan 13, 1934, 89 y.o.   MRN: [id number]  HPI  89 year old never smoker referred for evaluation of chronic respiratory failure due to CHF after recent hospitalization She has been seen by my partner for asthmatic bronchitis after she had hospitalizations for "pneumonia" Last visit from 08/2021 was reviewed, she was continued on Symbicort -it seems she was never diagnosed with asthma prior to this   PMH :CKD 3a, HTN,  Chr Diastolic CHF, A fib, mod AS Hypothyroidism, Rt renal artery stenosis, Vertigo, Breast cancer, Esophageal candidiasis    She was hospitalized 8/15 to 8/17 for shortness of breath and noted to be hypoxic to 70%, she was discharged on 2 L oxygen.  She was evaluated by cardiology, aortic stenosis seem to be worse and a follow-up echo was advised in 6 months. She was seen by PCP on follow-up 8/30 chest x-ray showed small right effusion.  She still seem to require oxygen and was referred to [place].  Lasix was increased to 40 mg for 3 days She arrives accompanied by her daughter, her main complaint today is that POC is too heavy.  Oxygen saturation at rest on room air was 88% and dropped to 83% on ambulating 1 lap, I am POC 2 L to 83%  Blood pressure seems to fluctuate, morning reading is low and seems to increase by afternoon. She is maintained on bisoprolol hydralazine was decreased to 50 mg twice daily and losartan and has clonidine to take as needed She reports shortness of breath after walking a few steps and has noted her oxygen level drops in the 80s.  She reports intermittent chest tightness with walking  Chest x-ray 05/2022 shows increased interstitial infiltrates  Significant tests/ events reviewed  03/2023 echo: LVEF 60-65%, grade II dd, mod MS mean grad 6, mod to severe AS mean grad 34, AVA VTI 0.93 DI 0.41   05/2022 VQ scan negative. CT abdomen 01/2021 shows clear lungs  Past Medical History:  Diagnosis Date    Anemia    Arthritis    Asthma    Atrial fibrillation (HCC)    Basal cell carcinoma 02/06/2009   Left ear targus- (MOHS)   Basal cell carcinoma 09/28/2008   Right back-(CX35FU)   CKD (chronic kidney disease), stage III (HCC)    Heart murmur    Hyperlipidemia    Hypertension    Hypothyroidism    Nodule of right lung    Right upper lobe   Osteopenia due to cancer therapy 10/07/2022   Pneumonia    PONV (postoperative nausea and vomiting)    Renal insufficiency    Chronic   Renal vascular disease    Right renal artery stenosis (HCC) 05/09/2015   Squamous cell carcinoma of skin 09/07/2019   KA-Left shin-txpbx   Vertigo     Past Surgical History:  Procedure Laterality Date   BIOPSY  04/09/2021   Procedure: BIOPSY;  Surgeon: Dolores Frame, MD;  [facility];  Service: Gastroenterology;;   BREAST LUMPECTOMY WITH RADIOACTIVE SEED LOCALIZATION Right 04/08/2018   Procedure: BREAST LUMPECTOMY WITH RADIOACTIVE SEED LOCALIZATION;  Surgeon: Emelia Loron, MD;  Location: [facility] OR;  Service: General;  Laterality: Right;   COLONOSCOPY  2016   diverticulosis, hemorrhoids   ESOPHAGOGASTRODUODENOSCOPY  2016   normal   ESOPHAGOGASTRODUODENOSCOPY (EGD) WITH PROPOFOL N/A 04/09/2021   Procedure: ESOPHAGOGASTRODUODENOSCOPY (EGD) WITH PROPOFOL;  Surgeon: Dolores Frame, MD;  Location: [facility];  Service: Gastroenterology;  Laterality: N/A;  12:45   ESOPHAGOGASTRODUODENOSCOPY (EGD) WITH PROPOFOL N/A 09/01/2021   Procedure: ESOPHAGOGASTRODUODENOSCOPY (EGD) WITH PROPOFOL;  Surgeon: Lanelle Bal, DO;  Location: [facility];  Service: Endoscopy;  Laterality: N/A;   IR GENERIC HISTORICAL  08/29/2016   IR US GUIDE VASC ACCESS RIGHT 08/29/2016 [facility]   IR GENERIC HISTORICAL  08/29/2016   IR RENAL BILAT S&I MOD SED 08/29/2016 [facility]   KNEE ARTHROSCOPY Right    2019   THYROIDECTOMY, PARTIAL Right 1981   middle lobe removed 1st, then right lobe  removed 7-8 years later (approx. 1988)    Allergies  Allergen Reactions   Amlodipine Other (See Comments)    edema   Lisinopril     cough   Xeroform Occlusive Gauze Strip [Bismuth Tribromoph-Petrolatum]     Has to have no stick tape   Isosorbide Nitrate Nausea Only   Sulfonamide Derivatives Nausea And Vomiting and Rash   Terazosin Hives and Nausea Only    Social History   Socioeconomic History   Marital status: Widowed    Spouse name: Not on file   Number of children: 2   Years of education: 4   Highest education level: Not on file  Occupational History   Occupation: Retired  Tobacco Use   Smoking status: Never   Smokeless tobacco: Never  Vaping Use   Vaping status: Never Used  Substance and Sexual Activity   Alcohol use: No   Drug use: No   Sexual activity: Not Currently  Other Topics Concern   Not on file  Social History Narrative   Lives alone, but her daughter stays there at night   Ambidextrous (writes with left hand).   No caffeine use.   Social Determinants of Health   Financial Resource Strain: Low Risk  (87/29/2024)   Overall Financial Resource Strain (CARDIA)    Difficulty of Paying Living Expenses: Not hard at all  Food Insecurity: No Food Insecurity (04/06/2023)   Hunger Vital Sign    Worried About Running Out of Food in the Last Year: Never true    Ran Out of Food in the Last Year: Never true  Transportation Needs: No Transportation Needs (04/02/2023)   PRAPARE - Administrator, Civil Service (Medical): No    Lack of Transportation (Non-Medical): No  Physical Activity: Inactive (12/15/2022)   Exercise Vital Sign    Days of Exercise per Week: 0 days    Minutes of Exercise per Session: 0 min  Stress: No Stress Concern Present (12/15/2022)   [employer] of Occupational Health - Occupational Stress Questionnaire    Feeling of Stress : Not at all  Social Connections: Moderately Isolated (12/15/2022)   Social Connection and Isolation  Panel [NHANES]    Frequency of Communication with Friends and Family: More than three times a week    Frequency of Social Gatherings with Friends and Family: Three times a week    Attends Religious Services: More than 4 times per year    Active Member of Clubs or Organizations: No    Attends Banker Meetings: Never    Marital Status: Widowed  Intimate Partner Violence: Not At Risk (04/02/2023)   Humiliation, Afraid, Rape, and Kick questionnaire    Fear of Current or Ex-Partner: No    Emotionally Abused: No    Physically Abused: No    Sexually Abused: No    Family History  Problem Relation Age of Onset   Thyroid disease  Mother    Stroke Father    Hypertension Brother    Lung cancer Maternal Uncle    Cancer Maternal Grandmother        Liver   Lung cancer Maternal Uncle    Hypertension Daughter    Hypercholesterolemia Daughter    Hypercholesterolemia Daughter     Review of Systems Constitutional: negative for anorexia, fevers and sweats  Eyes: negative for irritation, redness and visual disturbance  Ears, nose, mouth, throat, and face: negative for earaches, epistaxis, nasal congestion and sore throat  Respiratory: negative for cough,  sputum and wheezing  Cardiovascular: negative for   orthopnea, palpitations and syncope  Gastrointestinal: negative for abdominal pain, constipation, diarrhea, melena, nausea and vomiting  Genitourinary:negative for dysuria, frequency and hematuria  Hematologic/lymphatic: negative for bleeding, easy bruising and lymphadenopathy  Musculoskeletal:negative for arthralgias, muscle weakness and stiff joints  Neurological: negative for coordination problems, gait problems, headaches and weakness  Endocrine: negative for diabetic symptoms including polydipsia, polyuria and weight loss     Objective:   Physical Exam  Gen. Pleasant, thin petite, elderly woman in no distress, normal affect ENT - no pallor,icterus, no post nasal drip Neck:  No JVD, no thyromegaly, no carotid bruits Lungs: no use of accessory muscles, no dullness to percussion, bibasal dry crackles Cardiovascular: Rhythm regular, heart sounds  normal, no murmurs or gallops, no peripheral edema Abdomen: soft and non-tender, no hepatosplenomegaly, BS normal. Musculoskeletal: No deformities, no cyanosis or clubbing Neuro:  alert, non focal       Assessment & Plan:    It is surprising that she is still needing oxygen even though she appears well diuresed.  We may have to think about a second etiology for hypoxia other than CHF. She has bibasal crackles, review of previous imaging shows increased interstitial infiltrates in 05/2022 which resolved suggesting that this was also pulmonary edema. Will obtain high-resolution CT chest to rule out ILD and will also obtain PFTs to rule out underlying obstructive lung disease given the episode of asthmatic bronchitis  in 2022.

## 2023-04-22 NOTE — Progress Notes (Signed)
Patient presents today fr Prolia and retacrit injection per providers order.   Labs and vital signs reviewed by PA.  Message received from Rojelio Brenner PA patient okay for treatment.  Stable during administration without incident; injection sites WNL; see MAR for injection details.  Patient tolerated procedure well and without incident.  No questions or complaints noted at this time.

## 2023-04-22 NOTE — Assessment & Plan Note (Signed)
Appears moderate to severe Repeat echo has been advised in 6 months. Blood pressure fluctuation may need cardiology input

## 2023-04-22 NOTE — Assessment & Plan Note (Addendum)
She requests a lightweight POC and we will send an order to [employer].  She does qualify  We will await cardiology evaluation for prognosis

## 2023-04-22 NOTE — Patient Instructions (Signed)
   X High res Ct chest  X schedule PFTs  X send order to [employer] for lighter weight POC

## 2023-04-22 NOTE — Patient Instructions (Addendum)
[facility] at [facility] Discharge Instructions  You were seen today by Rojelio Brenner PA-C for your history of breast cancer, osteopenia, and low blood counts.  HISTORY OF BREAST CANCER: You did not have any evidence of recurrent breast cancer on today's exam.   Your Breast Cancer Index test did not show any benefit from taking letrozole for more than 5 years.  Therefore you can STOP taking letrozole after 05/29/2023. You will be due for your next mammogram and breast exam in August 2025.  OSTEOPENIA: You have some ongoing weakness in your bones, which is at least partially related to the letrozole medication that you are taking for your breast cancer.   Continue Prolia injections once every 6 months to help to improve your bone density. You have been scheduled for bone density scan by your primary care doctor.  LOW BLOOD COUNTS:  Your low hemoglobin/red blood cells may be related to your chronic kidney disease.  You may also have some underlying bone marrow dysfunction, such as a condition called MDS ("myelodysplastic syndrome").  That can only be proven with bone marrow biopsy.   After our discussion today, we agreed together to not pursue bone marrow biopsy at this time since it is a more invasive procedure and since you have other significant medical conditions and advanced age. We will start you on injections (Retacrit) once every 2 weeks.  This will help your body to make more healthy red blood cells and can be used to treat both anemia from chronic kidney disease and MDS.  IRON: Continue to take iron tablet ONCE daily.  VITAMIN B12:  Continue to take over-the-counter vitamin B12 supplement 500 mcg daily.  FOLLOW-UP APPOINTMENT: Office visit in 4 to 6 weeks with Rojelio Brenner PA-C   ** Thank you for trusting me with your healthcare!  I strive to provide all of my patients with quality care at each visit.  If you receive a survey for this visit, I would  be so grateful to you for taking the time to provide feedback.  Thank you in advance!  ~ Imajean Mcdermid                   Dr. Doreatha Massed   &   Rojelio Brenner, PA-C   - - - - - - - - - - - - - - - - - -    Thank you for choosing [facility] at [facility] to provide your oncology and hematology care.  To afford each patient quality time with our provider, please arrive at least 15 minutes before your scheduled appointment time.   If you have a lab appointment with the Cancer Center please come in thru the Main Entrance and check in at the main information desk.  You need to re-schedule your appointment should you arrive 10 or more minutes late.  We strive to give you quality time with our providers, and arriving late affects you and other patients whose appointments are after yours.  Also, if you no show three or more times for appointments you may be dismissed from the clinic at the providers discretion.     Again, thank you for choosing [facility].  Our hope is that these requests will decrease the amount of time that you wait before being seen by our physicians.       _____________________________________________________________  Should you have questions after your visit to [facility], please  contact our office at [phone] and follow the prompts.  Our office hours are 8:00 a.m. and 4:30 p.m. Monday - Friday.  Please note that voicemails left after 4:00 p.m. may not be returned until the following business day.  We are closed weekends and major holidays.  You do have access to a nurse 24-7, just call the main number to the clinic [phone] and do not press any options, hold on the line and a nurse will answer the phone.    For prescription refill requests, have your pharmacy contact our office and allow 72 hours.    Due to Covid, you will need to wear a mask upon entering the hospital. If you do not have a mask, a mask will be given  to you at the Main Entrance upon arrival. For doctor visits, patients may have 1 support person age 56 or older with them. For treatment visits, patients can not have anyone with them due to social distancing guidelines and our immunocompromised population.

## 2023-04-27 ENCOUNTER — Inpatient Hospital Stay (HOSPITAL_COMMUNITY): Admission: RE | Admit: 2023-04-27 | Payer: PPO | Source: Ambulatory Visit

## 2023-04-29 ENCOUNTER — Telehealth: Payer: Self-pay | Admitting: Pulmonary Disease

## 2023-04-29 NOTE — Telephone Encounter (Signed)
[name] daughter states [employer] has not gotten order for oxygen. [name] phone number is [phone].

## 2023-04-30 NOTE — Telephone Encounter (Signed)
As of 9/11 at 4:59pm the order had been faxed and received success result. Patients daughter is aware and will be in contact with [employer].

## 2023-05-03 ENCOUNTER — Emergency Department (HOSPITAL_COMMUNITY): Payer: PPO

## 2023-05-03 ENCOUNTER — Other Ambulatory Visit: Payer: Self-pay

## 2023-05-03 ENCOUNTER — Inpatient Hospital Stay (HOSPITAL_COMMUNITY)
Admission: EM | Admit: 2023-05-03 | Discharge: 2023-05-08 | DRG: 291 | Disposition: A | Discharge status: Home / Self Care | Payer: PPO | Attending: Internal Medicine | Admitting: Internal Medicine

## 2023-05-03 ENCOUNTER — Encounter (HOSPITAL_COMMUNITY): Payer: Self-pay | Admitting: *Deleted

## 2023-05-03 DIAGNOSIS — D469 Myelodysplastic syndrome, unspecified: Secondary | ICD-10-CM | POA: Diagnosis not present

## 2023-05-03 DIAGNOSIS — I130 Hypertensive heart and chronic kidney disease with heart failure and stage 1 through stage 4 chronic kidney disease, or unspecified chronic kidney disease: Principal | ICD-10-CM | POA: Diagnosis present

## 2023-05-03 DIAGNOSIS — Z79811 Long term (current) use of aromatase inhibitors: Secondary | ICD-10-CM

## 2023-05-03 DIAGNOSIS — N184 Chronic kidney disease, stage 4 (severe): Secondary | ICD-10-CM | POA: Diagnosis not present

## 2023-05-03 DIAGNOSIS — Z823 Family history of stroke: Secondary | ICD-10-CM | POA: Diagnosis not present

## 2023-05-03 DIAGNOSIS — I5031 Acute diastolic (congestive) heart failure: Secondary | ICD-10-CM | POA: Diagnosis not present

## 2023-05-03 DIAGNOSIS — C50511 Malignant neoplasm of lower-outer quadrant of right female breast: Secondary | ICD-10-CM

## 2023-05-03 DIAGNOSIS — I5032 Chronic diastolic (congestive) heart failure: Secondary | ICD-10-CM | POA: Diagnosis present

## 2023-05-03 DIAGNOSIS — N183 Chronic kidney disease, stage 3 unspecified: Secondary | ICD-10-CM | POA: Diagnosis present

## 2023-05-03 DIAGNOSIS — I110 Hypertensive heart disease with heart failure: Secondary | ICD-10-CM | POA: Diagnosis not present

## 2023-05-03 DIAGNOSIS — I5033 Acute on chronic diastolic (congestive) heart failure: Secondary | ICD-10-CM | POA: Diagnosis not present

## 2023-05-03 DIAGNOSIS — I509 Heart failure, unspecified: Secondary | ICD-10-CM | POA: Diagnosis not present

## 2023-05-03 DIAGNOSIS — I517 Cardiomegaly: Secondary | ICD-10-CM | POA: Diagnosis not present

## 2023-05-03 DIAGNOSIS — Z1152 Encounter for screening for COVID-19: Secondary | ICD-10-CM | POA: Diagnosis not present

## 2023-05-03 DIAGNOSIS — J9611 Chronic respiratory failure with hypoxia: Secondary | ICD-10-CM | POA: Diagnosis not present

## 2023-05-03 DIAGNOSIS — R0989 Other specified symptoms and signs involving the circulatory and respiratory systems: Secondary | ICD-10-CM | POA: Diagnosis not present

## 2023-05-03 DIAGNOSIS — Z923 Personal history of irradiation: Secondary | ICD-10-CM

## 2023-05-03 DIAGNOSIS — M1990 Unspecified osteoarthritis, unspecified site: Secondary | ICD-10-CM | POA: Diagnosis not present

## 2023-05-03 DIAGNOSIS — R0902 Hypoxemia: Secondary | ICD-10-CM

## 2023-05-03 DIAGNOSIS — I060 Rheumatic aortic stenosis: Secondary | ICD-10-CM | POA: Diagnosis not present

## 2023-05-03 DIAGNOSIS — Z8249 Family history of ischemic heart disease and other diseases of the circulatory system: Secondary | ICD-10-CM

## 2023-05-03 DIAGNOSIS — K219 Gastro-esophageal reflux disease without esophagitis: Secondary | ICD-10-CM | POA: Diagnosis present

## 2023-05-03 DIAGNOSIS — I350 Nonrheumatic aortic (valve) stenosis: Secondary | ICD-10-CM

## 2023-05-03 DIAGNOSIS — C50911 Malignant neoplasm of unspecified site of right female breast: Secondary | ICD-10-CM | POA: Diagnosis present

## 2023-05-03 DIAGNOSIS — R54 Age-related physical debility: Secondary | ICD-10-CM | POA: Diagnosis present

## 2023-05-03 DIAGNOSIS — Z85828 Personal history of other malignant neoplasm of skin: Secondary | ICD-10-CM | POA: Diagnosis not present

## 2023-05-03 DIAGNOSIS — I10 Essential (primary) hypertension: Secondary | ICD-10-CM | POA: Diagnosis not present

## 2023-05-03 DIAGNOSIS — Z66 Do not resuscitate: Secondary | ICD-10-CM | POA: Diagnosis present

## 2023-05-03 DIAGNOSIS — E039 Hypothyroidism, unspecified: Secondary | ICD-10-CM | POA: Diagnosis present

## 2023-05-03 DIAGNOSIS — Z800 Family history of malignant neoplasm of digestive organs: Secondary | ICD-10-CM

## 2023-05-03 DIAGNOSIS — Z882 Allergy status to sulfonamides status: Secondary | ICD-10-CM

## 2023-05-03 DIAGNOSIS — E785 Hyperlipidemia, unspecified: Secondary | ICD-10-CM | POA: Diagnosis not present

## 2023-05-03 DIAGNOSIS — D696 Thrombocytopenia, unspecified: Secondary | ICD-10-CM | POA: Diagnosis not present

## 2023-05-03 DIAGNOSIS — R600 Localized edema: Secondary | ICD-10-CM | POA: Diagnosis present

## 2023-05-03 DIAGNOSIS — Z8349 Family history of other endocrine, nutritional and metabolic diseases: Secondary | ICD-10-CM

## 2023-05-03 DIAGNOSIS — Z853 Personal history of malignant neoplasm of breast: Secondary | ICD-10-CM | POA: Diagnosis not present

## 2023-05-03 DIAGNOSIS — N1831 Chronic kidney disease, stage 3a: Secondary | ICD-10-CM | POA: Diagnosis not present

## 2023-05-03 DIAGNOSIS — J9621 Acute and chronic respiratory failure with hypoxia: Secondary | ICD-10-CM | POA: Insufficient documentation

## 2023-05-03 DIAGNOSIS — Z170 Estrogen receptor positive status [ER+]: Secondary | ICD-10-CM

## 2023-05-03 DIAGNOSIS — Z79899 Other long term (current) drug therapy: Secondary | ICD-10-CM | POA: Diagnosis not present

## 2023-05-03 DIAGNOSIS — I480 Paroxysmal atrial fibrillation: Secondary | ICD-10-CM | POA: Diagnosis present

## 2023-05-03 DIAGNOSIS — N1832 Chronic kidney disease, stage 3b: Secondary | ICD-10-CM | POA: Diagnosis not present

## 2023-05-03 DIAGNOSIS — D61818 Other pancytopenia: Secondary | ICD-10-CM | POA: Diagnosis not present

## 2023-05-03 DIAGNOSIS — R627 Adult failure to thrive: Secondary | ICD-10-CM | POA: Diagnosis not present

## 2023-05-03 DIAGNOSIS — I701 Atherosclerosis of renal artery: Secondary | ICD-10-CM | POA: Diagnosis not present

## 2023-05-03 DIAGNOSIS — Z79890 Hormone replacement therapy (postmenopausal): Secondary | ICD-10-CM

## 2023-05-03 DIAGNOSIS — Z9981 Dependence on supplemental oxygen: Secondary | ICD-10-CM | POA: Diagnosis not present

## 2023-05-03 DIAGNOSIS — E8779 Other fluid overload: Secondary | ICD-10-CM | POA: Diagnosis present

## 2023-05-03 DIAGNOSIS — J90 Pleural effusion, not elsewhere classified: Secondary | ICD-10-CM | POA: Diagnosis not present

## 2023-05-03 DIAGNOSIS — E782 Mixed hyperlipidemia: Secondary | ICD-10-CM | POA: Diagnosis present

## 2023-05-03 DIAGNOSIS — R918 Other nonspecific abnormal finding of lung field: Secondary | ICD-10-CM | POA: Diagnosis not present

## 2023-05-03 DIAGNOSIS — M8580 Other specified disorders of bone density and structure, unspecified site: Secondary | ICD-10-CM | POA: Diagnosis present

## 2023-05-03 DIAGNOSIS — R0602 Shortness of breath: Secondary | ICD-10-CM | POA: Diagnosis not present

## 2023-05-03 DIAGNOSIS — Z888 Allergy status to other drugs, medicaments and biological substances status: Secondary | ICD-10-CM

## 2023-05-03 DIAGNOSIS — Z801 Family history of malignant neoplasm of trachea, bronchus and lung: Secondary | ICD-10-CM

## 2023-05-03 DIAGNOSIS — J811 Chronic pulmonary edema: Secondary | ICD-10-CM | POA: Diagnosis not present

## 2023-05-03 LAB — BRAIN NATRIURETIC PEPTIDE: B Natriuretic Peptide: 654 pg/mL — ABNORMAL HIGH (ref 0.0–100.0)

## 2023-05-03 LAB — COMPREHENSIVE METABOLIC PANEL
ALT: 15 U/L (ref 0–44)
AST: 25 U/L (ref 15–41)
Albumin: 3.8 g/dL (ref 3.5–5.0)
Alkaline Phosphatase: 47 U/L (ref 38–126)
Anion gap: 9 (ref 5–15)
BUN: 25 mg/dL — ABNORMAL HIGH (ref 8–23)
CO2: 34 mmol/L — ABNORMAL HIGH (ref 22–32)
Calcium: 8.2 mg/dL — ABNORMAL LOW (ref 8.9–10.3)
Chloride: 93 mmol/L — ABNORMAL LOW (ref 98–111)
Creatinine, Ser: 1.4 mg/dL — ABNORMAL HIGH (ref 0.44–1.00)
GFR, Estimated: 36 mL/min — ABNORMAL LOW (ref >60)
Glucose, Bld: 138 mg/dL — ABNORMAL HIGH (ref 70–99)
Potassium: 4 mmol/L (ref 3.5–5.1)
Sodium: 136 mmol/L (ref 135–145)
Total Bilirubin: 1 mg/dL (ref 0.3–1.2)
Total Protein: 6.8 g/dL (ref 6.5–8.1)

## 2023-05-03 LAB — CBC WITH DIFFERENTIAL/PLATELET
Abs Immature Granulocytes: 0.01 10*3/uL (ref 0.00–0.07)
Basophils Absolute: 0 10*3/uL (ref 0.0–0.1)
Basophils Relative: 0 %
Eosinophils Absolute: 0 10*3/uL (ref 0.0–0.5)
Eosinophils Relative: 0 %
HCT: 27.7 % — ABNORMAL LOW (ref 36.0–46.0)
Hemoglobin: 8.4 g/dL — ABNORMAL LOW (ref 12.0–15.0)
Immature Granulocytes: 0 %
Lymphocytes Relative: 13 %
Lymphs Abs: 0.4 10*3/uL — ABNORMAL LOW (ref 0.7–4.0)
MCH: 27.1 pg (ref 26.0–34.0)
MCHC: 30.3 g/dL (ref 30.0–36.0)
MCV: 89.4 fL (ref 80.0–100.0)
Monocytes Absolute: 0.2 10*3/uL (ref 0.1–1.0)
Monocytes Relative: 9 %
Neutro Abs: 2.1 10*3/uL (ref 1.7–7.7)
Neutrophils Relative %: 78 %
Platelets: 98 10*3/uL — ABNORMAL LOW (ref 150–400)
RBC: 3.1 MIL/uL — ABNORMAL LOW (ref 3.87–5.11)
RDW: 16.1 % — ABNORMAL HIGH (ref 11.5–15.5)
WBC: 2.7 10*3/uL — ABNORMAL LOW (ref 4.0–10.5)
nRBC: 0 % (ref 0.0–0.2)

## 2023-05-03 LAB — SARS CORONAVIRUS 2 BY RT PCR: SARS Coronavirus 2 by RT PCR: NEGATIVE

## 2023-05-03 MED ORDER — HYDRALAZINE HCL 25 MG PO TABS
50.0000 mg | ORAL_TABLET | Freq: Two times a day (BID) | ORAL | Status: DC
  Administered 2023-05-03 – 2023-05-08 (×10): 50 mg via ORAL
  Filled 2023-05-03 (×9): qty 2

## 2023-05-03 MED ORDER — SODIUM CHLORIDE 0.9% FLUSH
3.0000 mL | Freq: Two times a day (BID) | INTRAVENOUS | Status: DC
  Administered 2023-05-03 – 2023-05-08 (×10): 3 mL via INTRAVENOUS

## 2023-05-03 MED ORDER — MIRTAZAPINE 15 MG PO TABS
7.5000 mg | ORAL_TABLET | Freq: Every day | ORAL | Status: DC
  Administered 2023-05-03 – 2023-05-07 (×5): 7.5 mg via ORAL
  Filled 2023-05-03 (×5): qty 1

## 2023-05-03 MED ORDER — BISOPROLOL FUMARATE 5 MG PO TABS
5.0000 mg | ORAL_TABLET | Freq: Every day | ORAL | Status: DC
  Administered 2023-05-04 – 2023-05-08 (×5): 5 mg via ORAL
  Filled 2023-05-03 (×5): qty 1

## 2023-05-03 MED ORDER — ACETAMINOPHEN 325 MG PO TABS
650.0000 mg | ORAL_TABLET | ORAL | Status: DC | PRN
  Administered 2023-05-04 – 2023-05-06 (×3): 650 mg via ORAL
  Filled 2023-05-03 (×3): qty 2

## 2023-05-03 MED ORDER — FUROSEMIDE 10 MG/ML IJ SOLN
40.0000 mg | Freq: Once | INTRAMUSCULAR | Status: AC
  Administered 2023-05-03: 40 mg via INTRAVENOUS
  Filled 2023-05-03: qty 4

## 2023-05-03 MED ORDER — SODIUM CHLORIDE 0.9% FLUSH: 3.0000 mL | INTRAVENOUS | Status: DC | PRN

## 2023-05-03 MED ORDER — SODIUM CHLORIDE 0.9 % IV SOLN: 250.0000 mL | INTRAVENOUS | Status: DC | PRN

## 2023-05-03 MED ORDER — LETROZOLE 2.5 MG PO TABS
2.5000 mg | ORAL_TABLET | Freq: Every day | ORAL | Status: DC
  Administered 2023-05-03 – 2023-05-07 (×5): 2.5 mg via ORAL
  Filled 2023-05-03 (×8): qty 1

## 2023-05-03 MED ORDER — ATORVASTATIN CALCIUM 10 MG PO TABS
10.0000 mg | ORAL_TABLET | Freq: Every day | ORAL | Status: DC
  Administered 2023-05-03 – 2023-05-07 (×5): 10 mg via ORAL
  Filled 2023-05-03 (×5): qty 1

## 2023-05-03 MED ORDER — FLECAINIDE ACETATE 50 MG PO TABS
50.0000 mg | ORAL_TABLET | Freq: Two times a day (BID) | ORAL | Status: DC
  Administered 2023-05-03 – 2023-05-08 (×10): 50 mg via ORAL
  Filled 2023-05-03 (×10): qty 1

## 2023-05-03 MED ORDER — FERROUS SULFATE 325 (65 FE) MG PO TABS
325.0000 mg | ORAL_TABLET | Freq: Every day | ORAL | Status: DC
  Administered 2023-05-04 – 2023-05-08 (×5): 325 mg via ORAL
  Filled 2023-05-03 (×5): qty 1

## 2023-05-03 MED ORDER — LOSARTAN POTASSIUM 25 MG PO TABS
25.0000 mg | ORAL_TABLET | Freq: Every day | ORAL | Status: DC
  Administered 2023-05-04 – 2023-05-06 (×3): 25 mg via ORAL
  Filled 2023-05-03 (×3): qty 1

## 2023-05-03 MED ORDER — FUROSEMIDE 10 MG/ML IJ SOLN
40.0000 mg | Freq: Two times a day (BID) | INTRAMUSCULAR | Status: DC
  Administered 2023-05-03 – 2023-05-05 (×4): 40 mg via INTRAVENOUS
  Filled 2023-05-03 (×4): qty 4

## 2023-05-03 MED ORDER — PANTOPRAZOLE SODIUM 40 MG PO TBEC
80.0000 mg | DELAYED_RELEASE_TABLET | Freq: Every day | ORAL | Status: DC
  Administered 2023-05-04 – 2023-05-08 (×5): 80 mg via ORAL
  Filled 2023-05-03 (×5): qty 2

## 2023-05-03 MED ORDER — LEVOTHYROXINE SODIUM 75 MCG PO TABS
75.0000 ug | ORAL_TABLET | Freq: Every day | ORAL | Status: DC
  Administered 2023-05-04 – 2023-05-08 (×5): 75 ug via ORAL
  Filled 2023-05-03 (×6): qty 1

## 2023-05-03 MED ORDER — ONDANSETRON HCL 4 MG/2ML IJ SOLN: 4.0000 mg | Freq: Four times a day (QID) | INTRAMUSCULAR | Status: DC | PRN

## 2023-05-03 NOTE — ED Notes (Signed)
ED Provider at bedside. 

## 2023-05-03 NOTE — Hospital Course (Signed)
89 year old female with history of right breast cancer, pancytopenia, stage IIIb CKD, hypertension, hyperlipidemia, atrial fibrillation, moderate to severe aortic stenosis, hypothyroidism and other past medical history detailed below presents to the emergency department with shortness of breath.  Patient had been hospitalized 04/02/2023 through 04/04/2023 for acute diastolic heart failure associated with acute hypoxic respiratory failure and was discharged on Lasix 40 mg daily which she has been taking regularly.  She had to increase her dose to twice daily for 3 days due to weight gain and has returned to regular dose.  She now is starting to gain weight again.  She is having more peripheral edema.  She is also having more weakness and shortness of breath.  She denies chest pain.  She was discharged on chronic oxygen 2 L but has had to increase the oxygen to 4 L in the past several days due to shortness of breath.  Her home oxygen saturation readings have been in the low 90s down into the 80s.  She recently saw her pulmonologist and is being worked up with an outpatient high-resolution CT scan of the chest to rule out ILD.  This is being worked up due to her increasing oxygen requirements.  In the ED she was noted to have an elevated BNP and peripheral edema and her chest x-ray showed pulmonary edema.  She was started on IV furosemide and admission was requested.

## 2023-05-03 NOTE — H&P (Signed)
History and Physical  [facility]  [name] IEP:[id number] DOB: 05-Oct-1933 DOA: 05/03/2023  PCP: [name], DO  Patient coming from: Home by EMS  Level of care: Telemetry  I have personally briefly reviewed patient's old medical records in [facility]  Chief Complaint: SOB   HPI: [name] is a 89 year old female with history of right breast cancer, pancytopenia, stage IIIb CKD, hypertension, hyperlipidemia, atrial fibrillation, moderate to severe aortic stenosis, hypothyroidism and other past medical history detailed below presents to the emergency department with shortness of breath.  Patient had been hospitalized 04/02/2023 through 04/04/2023 for acute diastolic heart failure associated with acute hypoxic respiratory failure and was discharged on Lasix 40 mg daily which she has been taking regularly.  She had to increase her dose to twice daily for 3 days due to weight gain and has returned to regular dose.  She now is starting to gain weight again.  She is having more peripheral edema.  She is also having more weakness and shortness of breath.  She denies chest pain.  She was discharged on chronic oxygen 2 L but has had to increase the oxygen to 4 L in the past several days due to shortness of breath.  Her home oxygen saturation readings have been in the low 90s down into the 80s.  She recently saw her pulmonologist and is being worked up with an outpatient high-resolution CT scan of the chest to rule out ILD.  This is being worked up due to her increasing oxygen requirements.  In the ED she was noted to have an elevated BNP and peripheral edema and her chest x-ray showed pulmonary edema.  She was started on IV furosemide and admission was requested.    Past Medical History:  Diagnosis Date   Anemia    Arthritis    Asthma    Atrial fibrillation (HCC)    Basal cell carcinoma 02/06/2009   Left ear targus- (MOHS)   Basal cell carcinoma 09/28/2008   Right  back-([id number])   CKD (chronic kidney disease), stage III (HCC)    Heart murmur    Hyperlipidemia    Hypertension    Hypothyroidism    Nodule of right lung    Right upper lobe   Osteopenia due to cancer therapy 10/07/2022   Pneumonia    PONV (postoperative nausea and vomiting)    Renal insufficiency    Chronic   Renal vascular disease    Right renal artery stenosis (HCC) 05/09/2015   Squamous cell carcinoma of skin 09/07/2019   KA-Left shin-txpbx   Vertigo     Past Surgical History:  Procedure Laterality Date   BIOPSY  04/09/2021   Procedure: BIOPSY;  Surgeon: Dolores Frame, MD;  [facility];  Service: Gastroenterology;;   BREAST LUMPECTOMY WITH RADIOACTIVE SEED LOCALIZATION Right 04/08/2018   Procedure: BREAST LUMPECTOMY WITH RADIOACTIVE SEED LOCALIZATION;  Surgeon: Emelia Loron, MD;  Location: [facility] OR;  Service: General;  Laterality: Right;   COLONOSCOPY  2016   diverticulosis, hemorrhoids   ESOPHAGOGASTRODUODENOSCOPY  2016   normal   ESOPHAGOGASTRODUODENOSCOPY (EGD) WITH PROPOFOL N/A 04/09/2021   Procedure: ESOPHAGOGASTRODUODENOSCOPY (EGD) WITH PROPOFOL;  Surgeon: Dolores Frame, MD;  Location: [facility];  Service: Gastroenterology;  Laterality: N/A;  12:45   ESOPHAGOGASTRODUODENOSCOPY (EGD) WITH PROPOFOL N/A 09/01/2021   Procedure: ESOPHAGOGASTRODUODENOSCOPY (EGD) WITH PROPOFOL;  Surgeon: Lanelle Bal, DO;  Location: [facility];  Service: Endoscopy;  Laterality: N/A;   IR  GENERIC HISTORICAL  08/29/2016   IR US GUIDE VASC ACCESS RIGHT 08/29/2016 [facility]   IR GENERIC HISTORICAL  08/29/2016   IR RENAL BILAT S&I MOD SED 08/29/2016 [facility]   KNEE ARTHROSCOPY Right    2019   THYROIDECTOMY, PARTIAL Right 1981   middle lobe removed 1st, then right lobe removed 7-8 years later (approx. 1988)     reports that she has never smoked. She has never used smokeless tobacco. She reports that she does not drink alcohol and does not  use drugs.  Allergies  Allergen Reactions   Norvasc [Amlodipine] Swelling    Edema    Tape Other (See Comments)    Has to have no stick tape.   Zestril [Lisinopril] Cough   Hytrin [Terazosin] Hives and Nausea Only   Imdur [Isosorbide Nitrate] Nausea Only   Sulfonamide Derivatives Nausea And Vomiting and Rash    Family History  Problem Relation Age of Onset   Thyroid disease Mother    Stroke Father    Hypertension Brother    Lung cancer Maternal Uncle    Cancer Maternal Grandmother        Liver   Lung cancer Maternal Uncle    Hypertension Daughter    Hypercholesterolemia Daughter    Hypercholesterolemia Daughter     Prior to Admission medications   Medication Sig Start Date End Date Taking? Authorizing Provider  atorvastatin (LIPITOR) 20 MG tablet TAKE 1/2 TABLET BY MOUTH DAILY Patient taking differently: Take 10 mg by mouth at bedtime. 03/19/23  Yes Gottschalk, Ashly M, DO  bisoprolol (ZEBETA) 5 MG tablet TAKE 1 TABLET (5 MG TOTAL) BY MOUTH DAILY. 08/08/22  Yes Delynn Flavin M, DO  Cholecalciferol (VITAMIN D-3 PO) Take 1 tablet by mouth at bedtime.   Yes [provider]  cloNIDine (CATAPRES) 0.1 MG tablet Take 1 tablet (0.1 mg total) by mouth as directed. May take one tablet as needed with systolic blood pressure greater than 170 09/10/22  Yes Hochrein, Fayrene Fearing, MD  Cyanocobalamin (VITAMIN B-12 PO) Take 1 tablet by mouth at bedtime.   Yes [provider]  feeding supplement (ENSURE ENLIVE / ENSURE PLUS) LIQD Take 237 mLs by mouth 2 (two) times daily between meals. Patient taking differently: Take 237 mLs by mouth daily as needed (decreased appetite). 08/28/22  Yes Tat, Onalee Hua, MD  ferrous sulfate 325 (65 FE) MG tablet Take 325 mg by mouth daily with breakfast.   Yes [provider]  flecainide (TAMBOCOR) 50 MG tablet TAKE 1 TABLET BY MOUTH TWICE A DAY 09/26/22  Yes Rollene Rotunda, MD  furosemide (LASIX) 40 MG tablet Take 1 tablet (40 mg total) by mouth  daily. 04/04/23 04/03/24 Yes Lewie Chamber, MD  hydrALAZINE (APRESOLINE) 50 MG tablet Take 1 tablet (50 mg total) by mouth 2 (two) times daily. 04/04/23  Yes Lewie Chamber, MD  letrozole [facility]) 2.5 MG tablet TAKE 1 TABLET BY MOUTH EVERY DAY Patient taking differently: Take 2.5 mg by mouth at bedtime. 01/22/23  Yes Doreatha Massed, MD  levothyroxine (SYNTHROID) 75 MCG tablet TAKE 1 TABLET BY MOUTH EVERY DAY Patient taking differently: Take 75 mcg by mouth daily before breakfast. 01/09/23  Yes Gottschalk, Ashly M, DO  losartan (COZAAR) 25 MG tablet Take 25 mg by mouth daily.   Yes [provider]  mirtazapine (REMERON) 7.5 MG tablet TAKE 1 TABLET BY MOUTH EVERYDAY AT BEDTIME 02/27/23  Yes Harper, Kristen S, PA-C  Multiple Vitamins-Minerals (MULTIVITAMIN WOMEN 50+) TABS Take 1 tablet by mouth at  bedtime.   Yes [provider]  pantoprazole (PROTONIX) 40 MG tablet TAKE 1 TABLET (40 MG TOTAL) BY MOUTH TWICE A DAY BEFORE MEALS Patient taking differently: Take 80 mg by mouth daily. 01/16/23  Yes Letta Median, PA-C    Physical Exam: Vitals:   05/03/23 1243 05/03/23 1400 05/03/23 1415 05/03/23 1530  BP: (!) 194/65 (!) 158/56 (!) 155/54 (!) 179/64  Pulse: 64 (!) 59 (!) 58 61  Resp: (!) 26 18 14 18   Temp: 98.2 F (36.8 C)   98.2 F (36.8 C)  SpO2: 95% 99% 99% 96%  Weight:      Height:       Constitutional: very thin, frail, elderly female, awake, alert, oriented x 3, NAD, calm, uncomfortable (feels cold) Eyes: PERRL, lids and conjunctivae normal ENMT: Mucous membranes are pale but moist. Posterior pharynx clear of any exudate or lesions.  Neck: normal, supple, no masses, no thyromegaly Respiratory: bibasilar and anterior crackles. Mild increased work of breathing.   Cardiovascular: normal s1, s2 sounds, loud holosystolic murmur, trace pretibial and pedal bilateral lower extremity edema. 2+ pedal pulses. No carotid bruits.  Abdomen: no tenderness, no masses palpated. No  hepatosplenomegaly. Bowel sounds positive.  Musculoskeletal: no clubbing / cyanosis. No joint deformity upper and lower extremities. Good ROM, no contractures. Normal muscle tone.  Skin: no rashes, lesions, ulcers. No induration Neurologic: CN 2-12 grossly intact. Sensation intact, DTR normal. Strength 5/5 in all 4.  Psychiatric: Normal judgment and insight. Alert and oriented x 3. Normal mood.   Labs on Admission: I have personally reviewed following labs and imaging studies  CBC: Recent Labs  Lab 05/03/23 1302  WBC 2.7*  NEUTROABS 2.1  HGB 8.4*  HCT 27.7*  MCV 89.4  PLT 98*   Basic Metabolic Panel: Recent Labs  Lab 05/03/23 1302  NA 136  K 4.0  CL 93*  CO2 34*  GLUCOSE 138*  BUN 25*  CREATININE 1.40*  CALCIUM 8.2*   GFR: Estimated Creatinine Clearance: 21.9 mL/min (A) (by C-G formula based on SCr of 1.4 mg/dL (H)). Liver Function Tests: Recent Labs  Lab 05/03/23 1302  AST 25  ALT 15  ALKPHOS 47  BILITOT 1.0  PROT 6.8  ALBUMIN 3.8   No results for input(s): "LIPASE", "AMYLASE" in the last 168 hours. No results for input(s): "AMMONIA" in the last 168 hours. Coagulation Profile: No results for input(s): "INR", "PROTIME" in the last 168 hours. Cardiac Enzymes: No results for input(s): "CKTOTAL", "CKMB", "CKMBINDEX", "TROPONINI" in the last 168 hours. BNP (last 3 results) No results for input(s): "PROBNP" in the last 8760 hours. HbA1C: No results for input(s): "HGBA1C" in the last 72 hours. CBG: No results for input(s): "GLUCAP" in the last 168 hours. Lipid Profile: No results for input(s): "CHOL", "HDL", "LDLCALC", "TRIG", "CHOLHDL", "LDLDIRECT" in the last 72 hours. Thyroid Function Tests: No results for input(s): "TSH", "T4TOTAL", "FREET4", "T3FREE", "THYROIDAB" in the last 72 hours. Anemia Panel: No results for input(s): "VITAMINB12", "FOLATE", "FERRITIN", "TIBC", "IRON", "RETICCTPCT" in the last 72 hours. Urine analysis:    Component Value Date/Time    COLORURINE AMBER (A) 08/26/2022 2205   APPEARANCEUR HAZY (A) 08/26/2022 2205   APPEARANCEUR Clear 10/11/2019 1326   LABSPEC 1.021 08/26/2022 2205   PHURINE 5.0 08/26/2022 2205   GLUCOSEU NEGATIVE 08/26/2022 2205   HGBUR SMALL (A) 08/26/2022 2205   BILIRUBINUR NEGATIVE 08/26/2022 2205   BILIRUBINUR Negative 10/11/2019 1326   KETONESUR NEGATIVE 08/26/2022 2205   PROTEINUR 100 (A) 08/26/2022 2205  UROBILINOGEN 0.2 11/27/2007 1051   NITRITE NEGATIVE 08/26/2022 2205   LEUKOCYTESUR NEGATIVE 08/26/2022 2205    Radiological Exams on Admission: DG Chest Portable 1 View  Result Date: 05/03/2023 CLINICAL DATA:  Short of breath, worsening over the last week. EXAM: PORTABLE CHEST 1 VIEW COMPARISON:  04/17/2023 and older exams.  CT, 09/27/2008. FINDINGS: Stable enlargement of the cardiac silhouette. No mediastinal or hilar masses. Lungs are hyperexpanded. Bilateral interstitial thickening. There hazy lung opacities that most prominent in the mid to lower lungs. Bilateral pleural effusions obscure the hemidiaphragms. No pneumothorax. Skeletal structures are grossly intact. IMPRESSION: 1. Cardiomegaly, interstitial thickening and hazy airspace lung opacities suspected to be due to congestive heart failure with pulmonary edema, similar in appearance to the most recent prior study. Electronically Signed   By: Amie Portland M.D.   On: 05/03/2023 13:26    EKG: Independently reviewed. NSR   April 03, 2023  Echocardiogram 1. Left ventricular ejection fraction, by estimation, is 60 to 65%. The left ventricle has normal function. The left ventricle has no regional wall motion abnormalities. There is mild left ventricular hypertrophy. Left ventricular diastolic arameters are consistent with Grade II diastolic dysfunction (pseudonormalization). Elevated left atrial pressure. 2. Right ventricular systolic function is normal. The right ventricular size is mildly enlarged. There is severely elevated pulmonary  artery systolic pressure. 3. Left atrial size was severely dilated. 4. Right atrial size was severely dilated. 5. The mitral valve is abnormal. Mild mitral valve regurgitation. Moderate mitral stenosis. The mean mitral valve gradient is 6.0 mmHg.HR of 60 6. The tricuspid valve is abnormal. 7. The aortic valve has an indeterminant number of cusps. There is moderate calcification of the aortic valve. There is moderate thickening of the aortic valve. Aortic valve regurgitation is mild. Moderate to severe aortic valve stenosis. Aortic valve mean gradient measures 34.0 mmHg. Aortic valve peak gradient measures 48.5 mmHg. Aortic valve area, by VTI measures 0.93 cm.DI 0.41 8. The inferior vena cava is dilated in size with <50% respiratory variability, suggesting right atrial pressure of 15 mmHg.    Assessment/Plan Principal Problem:   Acute heart failure (HCC) Active Problems:   Hyperlipidemia   Essential hypertension   Bilateral lower extremity edema   Hypertension with fluid overload   Paroxysmal atrial fibrillation (HCC)   CKD (chronic kidney disease) stage 3, GFR 30-59 ml/min (HCC)   Chronic diastolic heart failure (HCC)   Right renal artery stenosis (HCC)   Thrombocytopenia (HCC)   Hypothyroidism   Pancytopenia (HCC)   Malignant neoplasm of lower-outer quadrant of right breast of female, estrogen receptor positive (HCC)   Breast cancer, right (HCC)   Aortic stenosis Mod to Severe   Chronic respiratory failure with hypoxia (HCC)   Failure to thrive in adult   GERD (gastroesophageal reflux disease)   Acute HF preserved EF / Flash pulmonary edema  - strongly suspect flash pulmonary edema secondary to moderate to severe aortic stenosis - this is second hospitalization in 1 month for heart failure despite compliance with diuretics  - pt wants to discuss potential for valve replacement with her cardiologist Dr. Antoine Poche  - Pt has been diuresing well in ED with 1 dose of IV furosemide  given  - continue IV furosemide 40 mg BID  - continue to monitor weight, intake/output, ReDs vest reading and electrolytes  Essential Hypertension  - difficult to control at times - resume home medication - holding prn clonidine for now  - IV hydralazine ordered for SBP>180   Acute  on chronic respiratory failure with hypoxia  - secondary to flash pulmonary edema  - pt is now increased oxygen requirement now on 4L/min [place]  - treating acute heart failure as above  - wean oxygen as able down to baseline 2L/min   Hypothyroidism  - resume home levothyroxine daily   GERD - resume home pantoprazole 80 mg daily   DNR present on admission  - reviewed ACP documents - continue DNR order in hospital   Pancytopenia  - her hematologists suspects MDS - pt declined bone marrow biopsy due to advanced age - pt to start receiving retacrit/procrit injections every 2 weeks in cancer center and surveillance - continue iron sulfate, B12 supplementation   Breast Cancer - pt seems to be in remission now - she is followed by oncology and they plan to have her stop taking letrozole at the end of Oct 2024.   DVT prophylaxis: SCDs  Code Status: DNR   Family Communication: daughter at bedside   Disposition Plan: anticipate home in 2-3 days   Consults called:   Admission status: IP   Level of care: Telemetry Standley Dakins MD [facility] How to contact the [facility] Attending or Consulting provider 7A - 7P or covering provider during after hours 7P -7A, for this patient?  Check the care team in [facility] and look for a) attending/consulting [facility] provider listed and b) the [facility] team listed Log into [email or web address] and use [place]'s universal password to access. If you do not have the password, please contact the hospital operator. Locate the [facility] provider you are looking for under [facility] and page to a number that you can be directly reached. If you still have difficulty reaching the provider,  please page the [facility] (Director on Call) for the Hospitalists listed on amion for assistance.  Time spent: 75 mins   If 7PM-7AM, please contact night-coverage [email or web address] Password [facility]  05/03/2023, 3:36 PM

## 2023-05-03 NOTE — ED Triage Notes (Signed)
Pt c/o with increased SOB; pt's family member states it has been going on for a while but has gotten worse over the last week  Pt is chronically on 2L and she has had to increase to 4L while in triage and O2 sats at 92%   Pt c/o tightness in her chest and sates she feels some lightheadedness while lying down

## 2023-05-03 NOTE — ED Notes (Signed)
Pt assisted to bathroom and back to bed via wheelchair; EDP met pt in room to inform her she will be admitted

## 2023-05-03 NOTE — ED Provider Notes (Signed)
EMERGENCY DEPARTMENT AT [facility] Provider Note   CSN: [id number] Arrival date & time: 05/03/23  1230     History  Chief Complaint  Patient presents with   Shortness of Breath    [name] is a 89 y.o. female.  The history is provided by the patient.  Shortness of Breath Patient shortness of breath.  Worsened over the last few days.  History of CHF and potential interstitial lung disease.  Had been diuresed but still requiring oxygen previously.  Has had worsening shortness of breath.  States compliance with her medicines.  Will have sats it will be in the 90s and go down to the 80s.  Has been on 2 L of oxygen but has had to go up to 4 due to more shortness of breath.     Home Medications Prior to Admission medications   Medication Sig Start Date End Date Taking? Authorizing Provider  acetaminophen (TYLENOL) 325 MG tablet Take 325 mg by mouth daily as needed for moderate pain or headache.    [provider]  atorvastatin (LIPITOR) 20 MG tablet TAKE 1/2 TABLET BY MOUTH DAILY Patient taking differently: Take 10 mg by mouth at bedtime. 03/19/23   [name], DO  bisoprolol (ZEBETA) 5 MG tablet TAKE 1 TABLET (5 MG TOTAL) BY MOUTH DAILY. 08/08/22   [name], DO  cholecalciferol (VITAMIN D) 1000 units tablet Take 1,000 Units by mouth daily.    [provider]  cloNIDine (CATAPRES) 0.1 MG tablet Take 1 tablet (0.1 mg total) by mouth as directed. May take one tablet as needed with systolic blood pressure greater than 170 09/10/22   Rollene Rotunda, MD  cyanocobalamin (VITAMIN B12) 500 MCG tablet Take 1 tablet (500 mcg total) by mouth daily. 10/07/22   Carnella Guadalajara, PA-C  feeding supplement (ENSURE ENLIVE / ENSURE PLUS) LIQD Take 237 mLs by mouth 2 (two) times daily between meals. Patient taking differently: Take 237 mLs by mouth daily. 08/28/22   Catarina Hartshorn, MD  ferrous sulfate 325 (65 FE) MG tablet Take 325 mg by mouth  daily with breakfast.    [provider]  flecainide (TAMBOCOR) 50 MG tablet TAKE 1 TABLET BY MOUTH TWICE A DAY 09/26/22   Rollene Rotunda, MD  furosemide (LASIX) 40 MG tablet Take 1 tablet (40 mg total) by mouth daily. 04/04/23 04/03/24  Lewie Chamber, MD  hydrALAZINE (APRESOLINE) 50 MG tablet Take 1 tablet (50 mg total) by mouth 2 (two) times daily. 04/04/23   Lewie Chamber, MD  letrozole [facility]) 2.5 MG tablet TAKE 1 TABLET BY MOUTH EVERY DAY 01/22/23   Doreatha Massed, MD  levothyroxine (SYNTHROID) 75 MCG tablet TAKE 1 TABLET BY MOUTH EVERY DAY 01/09/23   Delynn Flavin M, DO  losartan (COZAAR) 25 MG tablet Take 25 mg by mouth daily.    [provider]  mirtazapine (REMERON) 7.5 MG tablet TAKE 1 TABLET BY MOUTH EVERYDAY AT BEDTIME 02/27/23   Letta Median, PA-C  Multiple Vitamin (MULTIVITAMIN) tablet Take 1 tablet by mouth daily.    [provider]  pantoprazole (PROTONIX) 40 MG tablet TAKE 1 TABLET (40 MG TOTAL) BY MOUTH TWICE A DAY BEFORE MEALS Patient taking differently: Take 40 mg by mouth daily. 01/16/23   Letta Median, PA-C      Allergies    Amlodipine, Lisinopril, Xeroform occlusive gauze strip [bismuth tribromoph-petrolatum], Isosorbide nitrate, Sulfonamide derivatives, and Terazosin    Review of Systems   Review  of Systems  Respiratory:  Positive for shortness of breath.     Physical Exam Updated Vital Signs BP (!) 155/54   Pulse (!) 58   Temp 98.2 F (36.8 C)   Resp 14   Ht 5\' 4"  (1.626 m)   Wt 49 kg   SpO2 99%   BMI 18.54 kg/m  Physical Exam Vitals reviewed.  Cardiovascular:     Rate and Rhythm: Normal rate.  Pulmonary:     Effort: Tachypnea present.     Breath sounds: Rales present.     Comments: Harsh breath sounds with some rales at the bases. Musculoskeletal:     Right lower leg: No edema.     Left lower leg: No edema.  Neurological:     Mental Status: She is alert.     ED Results / Procedures / Treatments    Labs (all labs ordered are listed, but only abnormal results are displayed) Labs Reviewed  BRAIN NATRIURETIC PEPTIDE - Abnormal; Notable for the following components:      Result Value   B Natriuretic Peptide 654.0 (*)    All other components within normal limits  COMPREHENSIVE METABOLIC PANEL - Abnormal; Notable for the following components:   Chloride 93 (*)    CO2 34 (*)    Glucose, Bld 138 (*)    BUN 25 (*)    Creatinine, Ser 1.40 (*)    Calcium 8.2 (*)    GFR, Estimated 36 (*)    All other components within normal limits  CBC WITH DIFFERENTIAL/PLATELET - Abnormal; Notable for the following components:   WBC 2.7 (*)    RBC 3.10 (*)    Hemoglobin 8.4 (*)    HCT 27.7 (*)    RDW 16.1 (*)    Platelets 98 (*)    Lymphs Abs 0.4 (*)    All other components within normal limits  SARS CORONAVIRUS 2 BY RT PCR    EKG None  Radiology DG Chest Portable 1 View  Result Date: 05/03/2023 CLINICAL DATA:  Short of breath, worsening over the last week. EXAM: PORTABLE CHEST 1 VIEW COMPARISON:  04/17/2023 and older exams.  CT, 09/27/2008. FINDINGS: Stable enlargement of the cardiac silhouette. No mediastinal or hilar masses. Lungs are hyperexpanded. Bilateral interstitial thickening. There hazy lung opacities that most prominent in the mid to lower lungs. Bilateral pleural effusions obscure the hemidiaphragms. No pneumothorax. Skeletal structures are grossly intact. IMPRESSION: 1. Cardiomegaly, interstitial thickening and hazy airspace lung opacities suspected to be due to congestive heart failure with pulmonary edema, similar in appearance to the most recent prior study. Electronically Signed   By: Amie Portland M.D.   On: 05/03/2023 13:26    Procedures Procedures    Medications Ordered in ED Medications  furosemide (LASIX) injection 40 mg (has no administration in time range)    ED Course/ Medical Decision Making/ A&P                                 Medical Decision Making Amount  and/or Complexity of Data Reviewed Labs: ordered. Radiology: ordered.  Risk Prescription drug management.   Patient shortness of breath.  History of CHF and potential interstitial lung disease.  Due to have CT scan to further evaluate the lungs.  However has been more shortness of breath.  X-ray shows potential CHF.  Does not have swelling on her legs, but her BNP is elevated.  Has increasing oxygen  requirement.  With increasing dyspnea increasing shortness of breath the think she would benefit from mission to the hospital particularly with underlying poor pulmonary status.  Will give IV Lasix here.  Will discuss with hospitalist for admission.        Final Clinical Impression(s) / ED Diagnoses Final diagnoses:  Acute on chronic congestive heart failure, unspecified heart failure type [facility])  Hypoxia    Rx / DC Orders ED Discharge Orders     None         Benjiman Core, MD 05/03/23 1434

## 2023-05-03 NOTE — ED Notes (Signed)
Emptied pts urine canister; 1000 mL noted to urine canister prior to emptying

## 2023-05-04 ENCOUNTER — Encounter (HOSPITAL_COMMUNITY): Payer: Self-pay | Admitting: Family Medicine

## 2023-05-04 ENCOUNTER — Inpatient Hospital Stay (HOSPITAL_COMMUNITY): Payer: PPO

## 2023-05-04 DIAGNOSIS — R627 Adult failure to thrive: Secondary | ICD-10-CM | POA: Diagnosis not present

## 2023-05-04 DIAGNOSIS — D61818 Other pancytopenia: Secondary | ICD-10-CM | POA: Diagnosis not present

## 2023-05-04 DIAGNOSIS — I350 Nonrheumatic aortic (valve) stenosis: Secondary | ICD-10-CM | POA: Diagnosis not present

## 2023-05-04 DIAGNOSIS — I480 Paroxysmal atrial fibrillation: Secondary | ICD-10-CM

## 2023-05-04 DIAGNOSIS — I5031 Acute diastolic (congestive) heart failure: Secondary | ICD-10-CM | POA: Diagnosis not present

## 2023-05-04 LAB — BASIC METABOLIC PANEL
Anion gap: 11 (ref 5–15)
BUN: 26 mg/dL — ABNORMAL HIGH (ref 8–23)
CO2: 35 mmol/L — ABNORMAL HIGH (ref 22–32)
Calcium: 7.6 mg/dL — ABNORMAL LOW (ref 8.9–10.3)
Chloride: 90 mmol/L — ABNORMAL LOW (ref 98–111)
Creatinine, Ser: 1.66 mg/dL — ABNORMAL HIGH (ref 0.44–1.00)
GFR, Estimated: 30 mL/min — ABNORMAL LOW (ref >60)
Glucose, Bld: 98 mg/dL (ref 70–99)
Potassium: 3.6 mmol/L (ref 3.5–5.1)
Sodium: 136 mmol/L (ref 135–145)

## 2023-05-04 LAB — CBC
HCT: 24.6 % — ABNORMAL LOW (ref 36.0–46.0)
Hemoglobin: 7.3 g/dL — ABNORMAL LOW (ref 12.0–15.0)
MCH: 26.5 pg (ref 26.0–34.0)
MCHC: 29.7 g/dL — ABNORMAL LOW (ref 30.0–36.0)
MCV: 89.5 fL (ref 80.0–100.0)
Platelets: 86 10*3/uL — ABNORMAL LOW (ref 150–400)
RBC: 2.75 MIL/uL — ABNORMAL LOW (ref 3.87–5.11)
RDW: 16.1 % — ABNORMAL HIGH (ref 11.5–15.5)
WBC: 2.8 10*3/uL — ABNORMAL LOW (ref 4.0–10.5)
nRBC: 0 % (ref 0.0–0.2)

## 2023-05-04 LAB — BRAIN NATRIURETIC PEPTIDE: B Natriuretic Peptide: 472 pg/mL — ABNORMAL HIGH (ref 0.0–100.0)

## 2023-05-04 LAB — MAGNESIUM: Magnesium: 1.9 mg/dL (ref 1.7–2.4)

## 2023-05-04 NOTE — Progress Notes (Signed)
PROGRESS NOTE   [name]  MWU:[id number] DOB: 05-31-1934 DOA: 05/03/2023 PCP: [name], DO   Chief Complaint  Patient presents with   Shortness of Breath   Level of care: Telemetry  Brief Admission History:  89 year old female with history of right breast cancer, pancytopenia, stage IIIb CKD, hypertension, hyperlipidemia, atrial fibrillation, moderate to severe aortic stenosis, hypothyroidism and other past medical history detailed below presents to the emergency department with shortness of breath.  Patient had been hospitalized 04/02/2023 through 04/04/2023 for acute diastolic heart failure associated with acute hypoxic respiratory failure and was discharged on Lasix 40 mg daily which she has been taking regularly.  She had to increase her dose to twice daily for 3 days due to weight gain and has returned to regular dose.  She now is starting to gain weight again.  She is having more peripheral edema.  She is also having more weakness and shortness of breath.  She denies chest pain.  She was discharged on chronic oxygen 2 L but has had to increase the oxygen to 4 L in the past several days due to shortness of breath.  Her home oxygen saturation readings have been in the low 90s down into the 80s.  She recently saw her pulmonologist and is being worked up with an outpatient high-resolution CT scan of the chest to rule out ILD.  This is being worked up due to her increasing oxygen requirements.  In the ED she was noted to have an elevated BNP and peripheral edema and her chest x-ray showed pulmonary edema.  She was started on IV furosemide and admission was requested.   Assessment and Plan:  Acute HF preserved EF / Flash pulmonary edema  - strongly suspect flash pulmonary edema secondary to moderate to severe aortic stenosis - this is second hospitalization in 1 month for heart failure despite compliance with diuretics  - pt wants to discuss potential for valve replacement with her  cardiologist Dr. Antoine Poche  - Pt has been diuresing well in ED with 1 dose of IV furosemide given  - continue IV furosemide 40 mg BID  - continue to monitor weight, intake/output, ReDs vest reading and electrolytes - chest xray 9/16 still showing findings of pulmonary edema - repeat CXR in AM after more diuresis over today   Essential Hypertension  - difficult to control at times - resume home medication - holding prn clonidine for now  - IV hydralazine ordered for SBP>180    Acute on chronic respiratory failure with hypoxia  - secondary to flash pulmonary edema  - pt is now increased oxygen requirement now on 4L/min [place]  - treating acute heart failure as above  - wean oxygen as able down to baseline 2L/min    Hypothyroidism  - resume home levothyroxine daily    GERD - resume home pantoprazole 80 mg daily    DNR present on admission  - reviewed ACP documents - continue DNR order in hospital    Pancytopenia  - her hematologists suspects MDS - pt declined bone marrow biopsy due to advanced age - pt to start receiving retacrit/procrit injections every 2 weeks in cancer center and surveillance - continue iron sulfate, B12 supplementation    Breast Cancer - pt seems to be in remission now - she is followed by oncology and they plan to have her stop taking letrozole at the end of Oct 2024.   DVT prophylaxis: SCDs Code Status: DNR  Family Communication: daughter telephone 9/16  Disposition: Status is: Inpatient   Consultants:   Procedures:   Antimicrobials:    Subjective: Pt reports she is still having shortness of breath, no chest pain, no palpitations.   Objective: Vitals:   05/04/23 0459 05/04/23 0500 05/04/23 1511 05/04/23 1513  BP: (!) 155/48  (!) 183/56 (!) 169/53  Pulse: 63  62   Resp: 17  14   Temp: 98.5 F (36.9 C)  98.1 F (36.7 C)   TempSrc: Oral  Oral   SpO2: 96%  96%   Weight:  49.8 kg    Height:        Intake/Output Summary (Last 24 hours) at  05/04/2023 1624 Last data filed at 05/04/2023 0535 Gross per 24 hour  Intake --  Output 600 ml  Net -600 ml   Filed Weights   05/03/23 1243 05/04/23 0500  Weight: 49 kg 49.8 kg   Examination:  General exam: Appears calm and comfortable  Respiratory system: bibasilar crackles. Cardiovascular system: normal S1 & S2 heard. Harsh holosystolic murmurs, No pedal edema. Gastrointestinal system: Abdomen is nondistended, soft and nontender. No organomegaly or masses felt. Normal bowel sounds heard. Central nervous system: Alert and oriented. No focal neurological deficits. Extremities: Symmetric 5 x 5 power. Skin: No rashes, lesions or ulcers. Psychiatry: Judgement and insight appear normal. Mood & affect appropriate.   Data Reviewed: I have personally reviewed following labs and imaging studies  CBC: Recent Labs  Lab 05/03/23 1302 05/04/23 0455  WBC 2.7* 2.8*  NEUTROABS 2.1  --   HGB 8.4* 7.3*  HCT 27.7* 24.6*  MCV 89.4 89.5  PLT 98* 86*    Basic Metabolic Panel: Recent Labs  Lab 05/03/23 1302 05/04/23 0455  NA 136 136  K 4.0 3.6  CL 93* 90*  CO2 34* 35*  GLUCOSE 138* 98  BUN 25* 26*  CREATININE 1.40* 1.66*  CALCIUM 8.2* 7.6*  MG  --  1.9    CBG: No results for input(s): "GLUCAP" in the last 168 hours.  Recent Results (from the past 240 hour(s))  SARS Coronavirus 2 by RT PCR (hospital order, performed in [facility] hospital lab) *cepheid single result test* Anterior Nasal Swab     Status: None   Collection Time: 05/03/23  1:02 PM   Specimen: Anterior Nasal Swab  Result Value Ref Range Status   SARS Coronavirus 2 by RT PCR NEGATIVE NEGATIVE Final    Comment: (NOTE) SARS-CoV-2 target nucleic acids are NOT DETECTED.  The SARS-CoV-2 RNA is generally detectable in upper and lower respiratory specimens during the acute phase of infection. The lowest concentration of SARS-CoV-2 viral copies this assay can detect is 250 copies / mL. A negative result does not  preclude SARS-CoV-2 infection and should not be used as the sole basis for treatment or other patient management decisions.  A negative result may occur with improper specimen collection / handling, submission of specimen other than nasopharyngeal swab, presence of viral mutation(s) within the areas targeted by this assay, and inadequate number of viral copies (<250 copies / mL). A negative result must be combined with clinical observations, patient history, and epidemiological information.  Fact Sheet for Patients:   RoadLapTop.co.za  Fact Sheet for Healthcare Providers: http://kim-miller.com/  This test is not yet approved or  cleared by the [place] FDA and has been authorized for detection and/or diagnosis of SARS-CoV-2 by FDA under an Emergency Use Authorization (EUA).  This EUA will remain in effect (meaning this test can be used) for the  duration of the COVID-19 declaration under Section 564(b)(1) of the Act, 21 U.S.C. section 360bbb-3(b)(1), unless the authorization is terminated or revoked sooner.  Performed at [facility], [address] [place], [place] [address]      Radiology Studies: [facility] Chest [facility] 1 View  Result Date: 05/04/2023 CLINICAL DATA:  Acute heart failure. EXAM: PORTABLE CHEST 1 VIEW COMPARISON:  May 03, 2023. FINDINGS: Stable cardiomegaly with mild central pulmonary vascular congestion. Probable minimal bibasilar pulmonary edema is noted. Small right pleural effusion is noted. Bony thorax is unremarkable. IMPRESSION: Stable cardiomegaly with mild central pulmonary vascular congestion and probable bibasilar pulmonary edema. Small right pleural effusion. Electronically Signed   By: Lupita Raider M.D.   On: 05/04/2023 08:40   DG Chest Portable 1 View  Result Date: 05/03/2023 CLINICAL DATA:  Short of breath, worsening over the last week. EXAM: PORTABLE CHEST 1 VIEW COMPARISON:  04/17/2023 and older exams.   CT, 09/27/2008. FINDINGS: Stable enlargement of the cardiac silhouette. No mediastinal or hilar masses. Lungs are hyperexpanded. Bilateral interstitial thickening. There hazy lung opacities that most prominent in the mid to lower lungs. Bilateral pleural effusions obscure the hemidiaphragms. No pneumothorax. Skeletal structures are grossly intact. IMPRESSION: 1. Cardiomegaly, interstitial thickening and hazy airspace lung opacities suspected to be due to congestive heart failure with pulmonary edema, similar in appearance to the most recent prior study. Electronically Signed   By: Amie Portland M.D.   On: 05/03/2023 13:26    Scheduled Meds:  atorvastatin  10 mg Oral QHS   bisoprolol  5 mg Oral Daily   ferrous sulfate  325 mg Oral Q breakfast   flecainide  50 mg Oral BID   furosemide  40 mg Intravenous BID   hydrALAZINE  50 mg Oral BID   letrozole  2.5 mg Oral QHS   levothyroxine  75 mcg Oral QAC breakfast   losartan  25 mg Oral Daily   mirtazapine  7.5 mg Oral QHS   pantoprazole  80 mg Oral Daily   sodium chloride flush  3 mL Intravenous Q12H   Continuous Infusions:  sodium chloride       LOS: 1 day   Time spent: 37 mins  Tonita Bills Laural Benes, MD How to contact the [facility] Attending or Consulting provider 7A - 7P or covering provider during after hours 7P -7A, for this patient?  Check the care team in [facility] and look for a) attending/consulting TRH provider listed and b) the [facility] team listed Log into [email or web address] and use [facility] universal password to access. If you do not have the password, please contact the hospital operator. Locate the [facility] provider you are looking for under [facility] and page to a number that you can be directly reached. If you still have difficulty reaching the provider, please page the [facility] (Director on Call) for the Hospitalists listed on amion for assistance.  05/04/2023, 4:24 PM

## 2023-05-04 NOTE — Progress Notes (Signed)
Patient ambulated in room and sat in chair for around 2 hours. Oxygen at 3L with sats at 96%. Family at bedside today.Purwick removed and no urinary incontinence this shift.

## 2023-05-04 NOTE — Plan of Care (Signed)
  Problem: Health Behavior/Discharge Planning: Goal: Ability to manage health-related needs will improve Outcome: Progressing   Problem: Clinical Measurements: Goal: Ability to maintain clinical measurements within normal limits will improve Outcome: Progressing   Problem: Pain Managment: Goal: General experience of comfort will improve Outcome: Progressing

## 2023-05-04 NOTE — TOC Initial Note (Signed)
Transition of Care [facility]) - Initial/Assessment Note    Patient Details  Name: [name] MRN: [id number] Date of Birth: 1933/09/19  Transition of Care [facility]) CM/SW Contact:    Villa Herb, LCSWA Phone Number: 05/04/2023, 11:04 AM  Clinical Narrative:                 Pt is high risk for readmission. Pt well known to TOC. Pt recently assessed on 8/16. Pt lives alone but her daughter stays with her at night. Pt is independent in completing her ADLs and able to drive to appointments. Pt has not had HH. Pt has been wearing O2 at home.    Pt states that she does normally weigh herself daily. Pt takes medications as prescribed. Pt states she does not always follow a heart healthy diet. TOC to follow.  Expected Discharge Plan: Home/Self Care Barriers to Discharge: Continued Medical Work up   Patient Goals and CMS Choice Patient states their goals for this hospitalization and ongoing recovery are:: return home CMS Medicare.gov Compare Post Acute Care list provided to:: Patient Choice offered to / list presented to : Patient      Expected Discharge Plan and Services In-house Referral: Clinical Social Work Discharge Planning Services: CM Consult   Living arrangements for the past 2 months: Single Family Home                                      Prior Living Arrangements/Services Living arrangements for the past 2 months: Single Family Home Lives with:: Self Patient language and need for interpreter reviewed:: Yes Do you feel safe going back to the place where you live?: Yes      Need for Family Participation in Patient Care: Yes (Comment) Care giver support system in place?: Yes (comment) Current home services: DME Criminal Activity/Legal Involvement Pertinent to Current Situation/Hospitalization: No - Comment as needed  Activities of Daily Living Home Assistive Devices/Equipment: Eyeglasses, Oxygen, Grab bars in shower, Shower chair with back, Walker (specify  type) ADL Screening (condition at time of admission) Patient's cognitive ability adequate to safely complete daily activities?: Yes Is the patient deaf or have difficulty hearing?: No Does the patient have difficulty seeing, even when wearing glasses/contacts?: No Does the patient have difficulty concentrating, remembering, or making decisions?: No Patient able to express need for assistance with ADLs?: Yes Does the patient have difficulty dressing or bathing?: No Independently performs ADLs?: Yes (appropriate for developmental age) Does the patient have difficulty walking or climbing stairs?: Yes Weakness of Legs: None Weakness of Arms/Hands: None  Permission Sought/Granted                  Emotional Assessment Appearance:: Appears stated age Attitude/Demeanor/Rapport: Engaged Affect (typically observed): Accepting Orientation: : Oriented to Self, Oriented to Place, Oriented to Situation, Oriented to  Time Alcohol / Substance Use: Not Applicable Psych Involvement: No (comment)  Admission diagnosis:  Acute heart failure (HCC) [I50.9] Hypoxia [R09.02] Acute on chronic congestive heart failure, unspecified heart failure type [facility]) [I50.9] Patient Active Problem List   Diagnosis Date Noted   Acute heart failure (HCC) 05/03/2023   Acute on chronic diastolic CHF (congestive heart failure) (HCC) 04/02/2023   Cellulitis of left lower extremity 01/27/2023   Cellulitis of right lower extremity 01/27/2023   Osteopenia due to cancer therapy 10/07/2022   Epigastric burning sensation 09/18/2022   Heart failure due to valvular  disease (HCC) 09/07/2022   RSV (respiratory syncytial virus pneumonia) 08/27/2022   Acute on chronic heart failure with preserved ejection fraction (HCC) 08/24/2022   GERD (gastroesophageal reflux disease) 06/17/2022   Elevated d-dimer 06/17/2022   Anemia due to chronic blood loss 10/01/2021   Duodenal ulcer 10/01/2021   Loss of weight    Early satiety     Symptomatic anemia 08/31/2021   Acute-on-chronic kidney injury (HCC) 08/31/2021   Dry cough 08/31/2021   UGI bleed 08/31/2021   Failure to thrive in adult 08/31/2021   Mitral valve insufficiency 08/20/2021   History of right breast cancer    Malnutrition of moderate degree 08/03/2021   Chronic respiratory failure with hypoxia (HCC) 08/01/2021   Acute bronchitis 08/01/2021   Decreased appetite 07/25/2021   Protein-calorie malnutrition (HCC) 07/25/2021   Skin tear of right lower leg without complication 05/14/2021   Non-intractable vomiting 04/08/2021   Edema 02/22/2021   Constipation 01/23/2021   Osteoarthritis of knee 12/13/2020   History of arthroscopy of knee 12/13/2020   Pain in joint of left knee 12/13/2020   Atrial tachycardia 07/26/2019   Aortic valve sclerosis 07/26/2019   Aortic stenosis Mod to Severe 12/01/2018   Breast cancer, right (HCC) 04/08/2018   Malignant neoplasm of lower-outer quadrant of right breast of female, estrogen receptor positive (HCC) 02/25/2018   Tear of lateral meniscus of knee 10/27/2017   Blood blister 11/27/2016   Vertigo 09/09/2016   Allergic rhinitis 03/17/2016   Renal vascular disease 10/31/2015   Nodule of right lung 10/31/2015   Asthma without status asthmaticus 10/31/2015   Essential hypertension, malignant    Hypothyroidism 10/15/2015   Pancytopenia (HCC) 10/15/2015   Hyponatremia 10/14/2015   Thrombocytopenia (HCC) 06/16/2015   Right renal artery stenosis (HCC) 05/09/2015   Chronic diastolic heart failure (HCC) 04/03/2014   CKD (chronic kidney disease) stage 3, GFR 30-59 ml/min (HCC) 04/02/2014   Paroxysmal atrial fibrillation (HCC) 01/25/2014   Normocytic anemia 07/19/2013   Bilateral lower extremity edema 06/30/2013   Hypertension with fluid overload 06/30/2013   Varicose veins of lower extremities with other complications 05/03/2013   ABNORMAL STRESS ELECTROCARDIOGRAM 01/23/2010   Hyperlipidemia 12/18/2009   Essential  hypertension 12/18/2009   RENAL INSUFFICIENCY, CHRONIC 12/18/2009   Arthropathy 12/18/2009   PCP:  [name], DO Pharmacy:   [facility] [phone] - [place], [place] - [address] [place] [address] Phone: [phone] Fax: [phone]     Social Determinants of Health (SDOH) Social History: SDOH Screenings   Food Insecurity: No Food Insecurity (05/03/2023)  Housing: Low Risk  (05/03/2023)  Transportation Needs: No Transportation Needs (05/03/2023)  Utilities: Not At Risk (05/03/2023)  Alcohol Screen: Low Risk  (12/15/2022)  Depression (PHQ2-9): Low Risk  (04/17/2023)  Financial Resource Strain: Low Risk  (12/15/2022)  Physical Activity: Inactive (12/15/2022)  Social Connections: Moderately Isolated (12/15/2022)  Stress: No Stress Concern Present (12/15/2022)  Tobacco Use: Low Risk  (05/03/2023)   SDOH Interventions:     Readmission Risk Interventions    05/04/2023   11:02 AM 04/03/2023   11:33 AM 08/27/2022   11:36 AM  Readmission Risk Prevention Plan  Transportation Screening Complete Complete Complete  HRI or Home Care Consult  Complete   Social Work Consult for Recovery Care Planning/Counseling  Complete   Palliative Care Screening  Not Applicable   Medication Review [employer]) Complete Complete Complete  HRI or Home Care Consult Complete  Complete  SW Recovery Care/Counseling Consult Complete  Complete  Palliative Care Screening Not Applicable  Not Applicable  Skilled Nursing Facility Not Applicable  Not Applicable

## 2023-05-05 ENCOUNTER — Inpatient Hospital Stay: Payer: PPO

## 2023-05-05 ENCOUNTER — Inpatient Hospital Stay (HOSPITAL_COMMUNITY): Payer: PPO

## 2023-05-05 DIAGNOSIS — I350 Nonrheumatic aortic (valve) stenosis: Secondary | ICD-10-CM | POA: Diagnosis not present

## 2023-05-05 DIAGNOSIS — R627 Adult failure to thrive: Secondary | ICD-10-CM | POA: Diagnosis not present

## 2023-05-05 DIAGNOSIS — I5031 Acute diastolic (congestive) heart failure: Secondary | ICD-10-CM | POA: Diagnosis not present

## 2023-05-05 DIAGNOSIS — D61818 Other pancytopenia: Secondary | ICD-10-CM | POA: Diagnosis not present

## 2023-05-05 LAB — CBC
HCT: 24.5 % — ABNORMAL LOW (ref 36.0–46.0)
Hemoglobin: 7.6 g/dL — ABNORMAL LOW (ref 12.0–15.0)
MCH: 27.4 pg (ref 26.0–34.0)
MCHC: 31 g/dL (ref 30.0–36.0)
MCV: 88.4 fL (ref 80.0–100.0)
Platelets: 86 10*3/uL — ABNORMAL LOW (ref 150–400)
RBC: 2.77 MIL/uL — ABNORMAL LOW (ref 3.87–5.11)
RDW: 16.2 % — ABNORMAL HIGH (ref 11.5–15.5)
WBC: 2.4 10*3/uL — ABNORMAL LOW (ref 4.0–10.5)
nRBC: 0 % (ref 0.0–0.2)

## 2023-05-05 LAB — BASIC METABOLIC PANEL
Anion gap: 9 (ref 5–15)
BUN: 30 mg/dL — ABNORMAL HIGH (ref 8–23)
CO2: 36 mmol/L — ABNORMAL HIGH (ref 22–32)
Calcium: 7.2 mg/dL — ABNORMAL LOW (ref 8.9–10.3)
Chloride: 90 mmol/L — ABNORMAL LOW (ref 98–111)
Creatinine, Ser: 1.69 mg/dL — ABNORMAL HIGH (ref 0.44–1.00)
GFR, Estimated: 29 mL/min — ABNORMAL LOW (ref >60)
Glucose, Bld: 106 mg/dL — ABNORMAL HIGH (ref 70–99)
Potassium: 4 mmol/L (ref 3.5–5.1)
Sodium: 135 mmol/L (ref 135–145)

## 2023-05-05 LAB — BRAIN NATRIURETIC PEPTIDE: B Natriuretic Peptide: 233 pg/mL — ABNORMAL HIGH (ref 0.0–100.0)

## 2023-05-05 LAB — MAGNESIUM: Magnesium: 2 mg/dL (ref 1.7–2.4)

## 2023-05-05 MED ORDER — FUROSEMIDE 10 MG/ML IJ SOLN
40.0000 mg | Freq: Every day | INTRAMUSCULAR | Status: DC
  Administered 2023-05-06: 40 mg via INTRAVENOUS
  Filled 2023-05-05: qty 4

## 2023-05-05 NOTE — Progress Notes (Addendum)
PROGRESS NOTE   [name]  ZOX:[id number] DOB: 02/08/1934 DOA: 05/03/2023 PCP: [name], DO   Chief Complaint  Patient presents with   Shortness of Breath   Level of care: Telemetry  Brief Admission History:  89 year old female with history of right breast cancer, pancytopenia, stage IIIb CKD, hypertension, hyperlipidemia, atrial fibrillation, moderate to severe aortic stenosis, hypothyroidism and other past medical history detailed below presents to the emergency department with shortness of breath.  Patient had been hospitalized 04/02/2023 through 04/04/2023 for acute diastolic heart failure associated with acute hypoxic respiratory failure and was discharged on Lasix 40 mg daily which she has been taking regularly.  She had to increase her dose to twice daily for 3 days due to weight gain and has returned to regular dose.  She now is starting to gain weight again.  She is having more peripheral edema.  She is also having more weakness and shortness of breath.  She denies chest pain.  She was discharged on chronic oxygen 2 L but has had to increase the oxygen to 4 L in the past several days due to shortness of breath.  Her home oxygen saturation readings have been in the low 90s down into the 80s.  She recently saw her pulmonologist and is being worked up with an outpatient high-resolution CT scan of the chest to rule out ILD.  This is being worked up due to her increasing oxygen requirements.  In the ED she was noted to have an elevated BNP and peripheral edema and her chest x-ray showed pulmonary edema.  She was started on IV furosemide and admission was requested.   Assessment and Plan:  Acute HF preserved EF / Flash pulmonary edema  - strongly suspect flash pulmonary edema secondary to moderate to severe aortic stenosis - this is second hospitalization in 1 month for heart failure despite compliance with diuretics  - pt wants to discuss potential for valve replacement with her  cardiologist Dr. Antoine Poche  - Pt has been diuresing well with IV furosemide   - continue IV furosemide 40 mg reduced to once daily on 9/17  - continue to monitor weight, intake/output, ReDs vest reading and electrolytes - chest xray 9/16 still showing findings of pulmonary edema - repeat CXR 9/17 shows more improvement and less edema    Essential Hypertension  - difficult to control at times - resume home medication - holding prn clonidine for now  - IV hydralazine ordered for SBP>180    Acute on chronic respiratory failure with hypoxia  - secondary to flash pulmonary edema  - pt is now increased oxygen requirement now on 4L/min [place]  - treating acute heart failure as above  - wean oxygen as able down to baseline 2L/min    Hypothyroidism  - resume home levothyroxine daily    GERD - resumed home pantoprazole 80 mg daily    Bradycardia - asymptomatic - HR in 50s in last 24 hours, she was kept on her home cardiac meds - pt currently asymptomatic but if persists we may need to adjust her HR lowering medications  DNR present on admission  - reviewed ACP documents - continue DNR order in hospital    Pancytopenia  - her hematologists suspects MDS - pt declined bone marrow biopsy due to advanced age - pt recently started receiving retacrit/procrit injections every 2 weeks in cancer center and surveillance - continue iron sulfate, B12 supplementation  - I left message with [facility] 9/17 about  procrit injection - have not heard back: update: Dr Ellin Saba said that pt can have procrit injection in hospital but I learned that our inpatient pharmacy doesn't stock it and would have to be substituted with arenesp. Would discuss with pharm D on 9/18 about giving an equivalent dose to the 10,000 units of procrit that she normally receives every 2 weeks.   Breast Cancer - pt seems to be in remission now - she is followed by oncology and they plan to have her stop taking letrozole at the  end of Oct 2024.   DVT prophylaxis: SCDs Code Status: DNR  Family Communication: daughter telephone 9/16 Disposition: Status is: Inpatient   Consultants:   Procedures:   Antimicrobials:    Subjective: Pt concerned about needing to get her procrit injection, no CP, no SOB or palpitations   Objective: Vitals:   05/04/23 2121 05/05/23 0337 05/05/23 0900 05/05/23 1000  BP: (!) 157/59 (!) 174/54  (!) 144/46  Pulse: (!) 59 62  (!) 57  Resp: 16 17  20   Temp: 97.6 F (36.4 C) 97.8 F (36.6 C)  98.4 F (36.9 C)  TempSrc: Oral Oral Axillary Oral  SpO2: 92% 91%  96%  Weight:  49.4 kg    Height:        Intake/Output Summary (Last 24 hours) at 05/05/2023 1252 Last data filed at 05/05/2023 0958 Gross per 24 hour  Intake 360 ml  Output --  Net 360 ml   Filed Weights   05/03/23 1243 05/04/23 0500 05/05/23 0337  Weight: 49 kg 49.8 kg 49.4 kg   Examination:  General exam: Appears calm and comfortable  Respiratory system: bibasilar crackles. Cardiovascular system: normal S1 & S2 heard. Harsh holosystolic murmurs, No pedal edema. Gastrointestinal system: Abdomen is nondistended, soft and nontender. No organomegaly or masses felt. Normal bowel sounds heard. Central nervous system: Alert and oriented. No focal neurological deficits. Extremities: Symmetric 5 x 5 power. Skin: No rashes, lesions or ulcers. Psychiatry: Judgement and insight appear normal. Mood & affect appropriate.   Data Reviewed: I have personally reviewed following labs and imaging studies  CBC: Recent Labs  Lab 05/03/23 1302 05/04/23 0455 05/05/23 0536  WBC 2.7* 2.8* 2.4*  NEUTROABS 2.1  --   --   HGB 8.4* 7.3* 7.6*  HCT 27.7* 24.6* 24.5*  MCV 89.4 89.5 88.4  PLT 98* 86* 86*    Basic Metabolic Panel: Recent Labs  Lab 05/03/23 1302 05/04/23 0455 05/05/23 0536  NA 136 136 135  K 4.0 3.6 4.0  CL 93* 90* 90*  CO2 34* 35* 36*  GLUCOSE 138* 98 106*  BUN 25* 26* 30*  CREATININE 1.40* 1.66* 1.69*   CALCIUM 8.2* 7.6* 7.2*  MG  --  1.9 2.0    CBG: No results for input(s): "GLUCAP" in the last 168 hours.  Recent Results (from the past 240 hour(s))  SARS Coronavirus 2 by RT PCR (hospital order, performed in [facility] hospital lab) *cepheid single result test* Anterior Nasal Swab     Status: None   Collection Time: 05/03/23  1:02 PM   Specimen: Anterior Nasal Swab  Result Value Ref Range Status   SARS Coronavirus 2 by RT PCR NEGATIVE NEGATIVE Final    Comment: (NOTE) SARS-CoV-2 target nucleic acids are NOT DETECTED.  The SARS-CoV-2 RNA is generally detectable in upper and lower respiratory specimens during the acute phase of infection. The lowest concentration of SARS-CoV-2 viral copies this assay can detect is 250 copies / mL. A  negative result does not preclude SARS-CoV-2 infection and should not be used as the sole basis for treatment or other patient management decisions.  A negative result may occur with improper specimen collection / handling, submission of specimen other than nasopharyngeal swab, presence of viral mutation(s) within the areas targeted by this assay, and inadequate number of viral copies (<250 copies / mL). A negative result must be combined with clinical observations, patient history, and epidemiological information.  Fact Sheet for Patients:   RoadLapTop.co.za  Fact Sheet for Healthcare Providers: http://kim-miller.com/  This test is not yet approved or  cleared by the [place] FDA and has been authorized for detection and/or diagnosis of SARS-CoV-2 by FDA under an Emergency Use Authorization (EUA).  This EUA will remain in effect (meaning this test can be used) for the duration of the COVID-19 declaration under Section 564(b)(1) of the Act, 21 U.S.C. section 360bbb-3(b)(1), unless the authorization is terminated or revoked sooner.  Performed at [facility], [address], [place], [place]  [address]      Radiology Studies: DG CHEST PORT 1 VIEW  Result Date: 05/05/2023 CLINICAL DATA:  Flash pulmonary edema. EXAM: PORTABLE CHEST 1 VIEW COMPARISON:  May 04, 2023. FINDINGS: Stable cardiomegaly with mild central pulmonary vascular congestion. Possible minimal bibasilar pulmonary edema is noted. Bony thorax is unremarkable. IMPRESSION: Stable cardiomegaly with mild central pulmonary vascular congestion and possible minimal bibasilar pulmonary edema. Electronically Signed   By: Lupita Raider M.D.   On: 05/05/2023 10:23   DG Chest Port 1 View  Result Date: 05/04/2023 CLINICAL DATA:  Acute heart failure. EXAM: PORTABLE CHEST 1 VIEW COMPARISON:  May 03, 2023. FINDINGS: Stable cardiomegaly with mild central pulmonary vascular congestion. Probable minimal bibasilar pulmonary edema is noted. Small right pleural effusion is noted. Bony thorax is unremarkable. IMPRESSION: Stable cardiomegaly with mild central pulmonary vascular congestion and probable bibasilar pulmonary edema. Small right pleural effusion. Electronically Signed   By: Lupita Raider M.D.   On: 05/04/2023 08:40   DG Chest Portable 1 View  Result Date: 05/03/2023 CLINICAL DATA:  Short of breath, worsening over the last week. EXAM: PORTABLE CHEST 1 VIEW COMPARISON:  04/17/2023 and older exams.  CT, 09/27/2008. FINDINGS: Stable enlargement of the cardiac silhouette. No mediastinal or hilar masses. Lungs are hyperexpanded. Bilateral interstitial thickening. There hazy lung opacities that most prominent in the mid to lower lungs. Bilateral pleural effusions obscure the hemidiaphragms. No pneumothorax. Skeletal structures are grossly intact. IMPRESSION: 1. Cardiomegaly, interstitial thickening and hazy airspace lung opacities suspected to be due to congestive heart failure with pulmonary edema, similar in appearance to the most recent prior study. Electronically Signed   By: Amie Portland M.D.   On: 05/03/2023 13:26    Scheduled  Meds:  atorvastatin  10 mg Oral QHS   bisoprolol  5 mg Oral Daily   ferrous sulfate  325 mg Oral Q breakfast   flecainide  50 mg Oral BID   furosemide  40 mg Intravenous BID   hydrALAZINE  50 mg Oral BID   letrozole  2.5 mg Oral QHS   levothyroxine  75 mcg Oral QAC breakfast   losartan  25 mg Oral Daily   mirtazapine  7.5 mg Oral QHS   pantoprazole  80 mg Oral Daily   sodium chloride flush  3 mL Intravenous Q12H   Continuous Infusions:  sodium chloride       LOS: 2 days   Time spent: 44 mins  Xara Paulding Laural Benes, MD  How to contact the [facility] Attending or Consulting provider 7A - 7P or covering provider during after hours 7P -7A, for this patient?  Check the care team in [facility] and look for a) attending/consulting [facility] provider listed and b) the [facility] team listed Log into [email or web address] and use [place] universal password to access. If you do not have the password, please contact the hospital operator. Locate the [facility] provider you are looking for under [facility] and page to a number that you can be directly reached. If you still have difficulty reaching the provider, please page the [facility] (Director on Call) for the Hospitalists listed on amion for assistance.  05/05/2023, 12:52 PM

## 2023-05-05 NOTE — Evaluation (Signed)
Physical Therapy Evaluation Patient Details Name: [name] MRN: [id number] DOB: 09/28/1933 Today's Date: 05/05/2023  History of Present Illness  [name] is a 89 year old female with history of right breast cancer, pancytopenia, stage IIIb CKD, hypertension, hyperlipidemia, atrial fibrillation, moderate to severe aortic stenosis, hypothyroidism and other past medical history detailed below presents to the emergency department with shortness of breath.  Patient had been hospitalized 04/02/2023 through 04/04/2023 for acute diastolic heart failure associated with acute hypoxic respiratory failure and was discharged on Lasix 40 mg daily which she has been taking regularly.  She had to increase her dose to twice daily for 3 days due to weight gain and has returned to regular dose.  She now is starting to gain weight again.  She is having more peripheral edema.  She is also having more weakness and shortness of breath.  She denies chest pain.  She was discharged on chronic oxygen 2 L but has had to increase the oxygen to 4 L in the past several days due to shortness of breath.  Her home oxygen saturation readings have been in the low 90s down into the 80s.  She recently saw her pulmonologist and is being worked up with an outpatient high-resolution CT scan of the chest to rule out ILD.  This is being worked up due to her increasing oxygen requirements.  In the ED she was noted to have an elevated BNP and peripheral edema and her chest x-ray showed pulmonary edema.  She was started on IV furosemide and admission was requested.   Clinical Impression  Patient functioning near baseline for functional mobility and gait and able to ambulate in room/hallway without loss of balance while on 2 LPM with SPO2 at 90-93%.  Patient encouraged to ambulate daily with nursing staff, mobility techs as tolerated for length of stay.  Plan:  Patient discharged from physical therapy to care of nursing for ambulation daily as  tolerated for length of stay.          If plan is discharge home, recommend the following: A little help with walking and/or transfers;A little help with bathing/dressing/bathroom;Help with stairs or ramp for entrance;Assistance with cooking/housework   Can travel by private vehicle        Equipment Recommendations None recommended by PT  Recommendations for Other Services       Functional Status Assessment Patient has had a recent decline in their functional status and/or demonstrates limited ability to make significant improvements in function in a reasonable and predictable amount of time     Precautions / Restrictions Precautions Precautions: None Restrictions Weight Bearing Restrictions: No      Mobility  Bed Mobility Overal bed mobility: Independent                  Transfers Overall transfer level: Independent                      Ambulation/Gait Ambulation/Gait assistance: Modified independent (Device/Increase time) Gait Distance (Feet): 100 Feet Assistive device: None Gait Pattern/deviations: Decreased step length - right, Decreased step length - left, Decreased stride length Gait velocity: decreased     General Gait Details: slightly labored cadence with decreased stride length, no loss of balance and limited mostly due to fatigue, on 2 LPM with SpO2 at 90-93%  Stairs            Wheelchair Mobility     Tilt Bed    Modified Rankin (Stroke Patients Only)  Balance Overall balance assessment: No apparent balance deficits (not formally assessed)                                           Pertinent Vitals/Pain Pain Assessment Pain Assessment: No/denies pain    Home Living Family/patient expects to be discharged to:: Private residence Living Arrangements: Children Available Help at Discharge: Family;Available PRN/intermittently Type of Home: House Home Access: Stairs to enter Entrance Stairs-Rails:  None Entrance Stairs-Number of Steps: 1   Home Layout: One level Home Equipment: Microbiologist (4 wheels);Cane - single point;Grab bars - tub/shower      Prior Function Prior Level of Function : Driving;Independent/Modified Independent             Mobility Comments: did own grocery shopping; walked community distances without assistive device ADLs Comments: Independent with ADLs, mostly independent with iADLs but daughter assists with cleaning     Extremity/Trunk Assessment   Upper Extremity Assessment Upper Extremity Assessment: Overall WFL for tasks assessed    Lower Extremity Assessment Lower Extremity Assessment: Overall WFL for tasks assessed    Cervical / Trunk Assessment Cervical / Trunk Assessment: Normal  Communication   Communication Communication: No apparent difficulties  Cognition Arousal: Alert Behavior During Therapy: WFL for tasks assessed/performed Overall Cognitive Status: Within Functional Limits for tasks assessed                                          General Comments      Exercises     Assessment/Plan    PT Assessment All further PT needs can be met in the next venue of care  PT Problem List Decreased strength;Decreased activity tolerance;Decreased balance;Decreased mobility       PT Treatment Interventions      PT Goals (Current goals can be found in the Care Plan section)  Acute Rehab PT Goals Patient Stated Goal: return home with family to assist PT Goal Formulation: With patient Time For Goal Achievement: 05/05/23 Potential to Achieve Goals: Good    Frequency       Co-evaluation               AM-PAC PT "6 Clicks" Mobility  Outcome Measure Help needed turning from your back to your side while in a flat bed without using bedrails?: None Help needed moving from lying on your back to sitting on the side of a flat bed without using bedrails?: None Help needed moving to and from a bed  to a chair (including a wheelchair)?: None Help needed standing up from a chair using your arms (e.g., wheelchair or bedside chair)?: None Help needed to walk in hospital room?: A Little Help needed climbing 3-5 steps with a railing? : A Little 6 Click Score: 22    End of Session Equipment Utilized During Treatment: Oxygen Activity Tolerance: Patient tolerated treatment well;Patient limited by fatigue Patient left: in chair Nurse Communication: Mobility status PT Visit Diagnosis: Unsteadiness on feet (R26.81);Other abnormalities of gait and mobility (R26.89);Muscle weakness (generalized) (M62.81)    Time: 5784-6962 PT Time Calculation (min) (ACUTE ONLY): 27 min   Charges:   PT Evaluation $PT Eval Moderate Complexity: 1 Mod PT Treatments $Therapeutic Activity: 23-37 mins PT General Charges $$ ACUTE PT VISIT: 1 Visit  3:30 PM, 05/05/23 Ocie Bob, MPT Physical Therapist with [facility] [phone] office [phone] mobile phone

## 2023-05-05 NOTE — Progress Notes (Signed)
Ambulated in hall X3 today and tolerated well on oxygen 2L. No complaints this shift.

## 2023-05-05 NOTE — Plan of Care (Signed)
Progressing

## 2023-05-05 NOTE — Plan of Care (Signed)

## 2023-05-06 ENCOUNTER — Inpatient Hospital Stay: Payer: PPO

## 2023-05-06 DIAGNOSIS — J9621 Acute and chronic respiratory failure with hypoxia: Secondary | ICD-10-CM | POA: Diagnosis not present

## 2023-05-06 DIAGNOSIS — I5033 Acute on chronic diastolic (congestive) heart failure: Secondary | ICD-10-CM | POA: Diagnosis not present

## 2023-05-06 DIAGNOSIS — D696 Thrombocytopenia, unspecified: Secondary | ICD-10-CM | POA: Diagnosis not present

## 2023-05-06 LAB — PREPARE RBC (CROSSMATCH)

## 2023-05-06 MED ORDER — SODIUM CHLORIDE 0.9% IV SOLUTION: Freq: Once | INTRAVENOUS | Status: AC

## 2023-05-06 MED ORDER — ORAL CARE MOUTH RINSE: 15.0000 mL | OROMUCOSAL | Status: DC | PRN

## 2023-05-06 MED ORDER — FUROSEMIDE 10 MG/ML IJ SOLN
40.0000 mg | Freq: Two times a day (BID) | INTRAMUSCULAR | Status: DC
  Administered 2023-05-06 – 2023-05-08 (×4): 40 mg via INTRAVENOUS
  Filled 2023-05-06 (×4): qty 4

## 2023-05-06 NOTE — Plan of Care (Signed)

## 2023-05-06 NOTE — Progress Notes (Signed)
PROGRESS NOTE  [name] WJX:[id number] DOB: 06-09-1934 DOA: 05/03/2023 PCP: [name], DO  Brief History:  89 year old female with history of right breast cancer, pancytopenia, stage IIIb CKD, hypertension, hyperlipidemia, atrial fibrillation, moderate to severe aortic stenosis, hypothyroidism and other past medical history detailed below presents to the emergency department with shortness of breath.  Patient had been hospitalized 04/02/2023 through 04/04/2023 for acute diastolic heart failure associated with acute hypoxic respiratory failure and was discharged on Lasix 40 mg daily which she has been taking regularly.  She had to increase her dose to twice daily for 3 days due to weight gain and has returned to regular dose.  She now is starting to gain weight again.  She is having more peripheral edema.  She is also having more weakness and shortness of breath.  She denies chest pain.  She was discharged on chronic oxygen 2 L but has had to increase the oxygen to 4 L in the past several days due to shortness of breath.  Her home oxygen saturation readings have been in the low 90s down into the 80s.  She recently saw her pulmonologist and is being worked up with an outpatient high-resolution CT scan of the chest to rule out ILD.  This is being worked up due to her increasing oxygen requirements.  In the ED she was noted to have an elevated BNP and peripheral edema and her chest x-ray showed pulmonary edema.  She was started on IV furosemide and admission was requested.   Assessment/Plan:  Acute on chronic HF preserved EF  - strongly suspect flash pulmonary edema secondary to moderate to severe aortic stenosis - this is second hospitalization in 1 month for heart failure despite compliance with diuretics  - pt wants to discuss potential for TAVR with her cardiologist Dr. Antoine Poche  - Pt has been diuresing well with IV furosemide   - increase furosemide to bid  - continue to  monitor weight, intake/output, ReDs vest reading and electrolytes - ReDS = 38 on 9/18--still has JVD - chest xray 9/16 still showing findings of pulmonary edema   Essential Hypertension  - resume home medication--Zebeta, hydralazine - d/c losartan due to rising serum creatinine - holding prn clonidine for now  - IV hydralazine ordered for SBP>180    Acute on chronic respiratory failure with hypoxia  - secondary to pulmonary edema  - pt is now increased oxygen requirement now on 4L/min [place]  - wean oxygen as able down to baseline 2L/min    Hypothyroidism  - resume home levothyroxine daily    GERD - resumed home pantoprazole 80 mg daily    Bradycardia - asymptomatic - HR in 50s in last 24 hours, she was kept on her home cardiac meds - pt currently asymptomatic but if persists we may need to adjust her HR lowering medications   DNR present on admission  - reviewed ACP documents - continue DNR order in hospital    Pancytopenia  - her hematologists suspects MDS - pt declined bone marrow biopsy due to advanced age - pt recently started receiving retacrit/procrit injections every 2 weeks in cancer center and surveillance - continue iron sulfate, B12 supplementation  - Dr. Laural Benes left message with [facility] 9/17 about procrit injection - have not heard back: update: Dr Ellin Saba said that pt can have procrit injection in hospital but I learned that our inpatient pharmacy doesn't stock it and would have  to be substituted with arenesp. Would discuss with pharm D on 9/18 about giving an equivalent dose to the 10,000 units of procrit that she normally receives every 2 weeks.   Right Breast Cancer - pt seems to be in remission now - she is followed by oncology and they plan to have her stop taking letrozole at the end of Oct 2024.       Family Communication:  daughter updated 9/18  Consultants:  none  Code Status:  DNR  DVT Prophylaxis:  SCDs   Procedures: As Listed in  Progress Note Above  Antibiotics: None       Subjective: Patient denies fevers, chills, headache, chest pain, dyspnea, nausea, vomiting, diarrhea, abdominal pain, dysuria, hematuria, hematochezia, and melena.   Objective: Vitals:   05/06/23 0358 05/06/23 0450 05/06/23 0951 05/06/23 1340  BP: (!) 167/63  (!) 179/63 (!) 148/52  Pulse: 66  61 (!) 59  Resp: 17  18 18   Temp: 97.8 F (36.6 C)  97.9 F (36.6 C) 98.1 F (36.7 C)  TempSrc: Oral  Oral Oral  SpO2: 92%  94% 95%  Weight:  49 kg    Height:        Intake/Output Summary (Last 24 hours) at 05/06/2023 1530 Last data filed at 05/06/2023 1300 Gross per 24 hour  Intake 480 ml  Output 2000 ml  Net -1520 ml   Weight change: -0.4 kg Exam:  General:  Pt is alert, follows commands appropriately, not in acute distress HEENT: No icterus, No thrush, No neck mass, [place]/AT Cardiovascular: RRR, S1/S2, no rubs, no gallops Respiratory: bibasilar crackles.  No wheez Abdomen: Soft/+BS, non tender, non distended, no guarding Extremities: No edema, No lymphangitis, No petechiae, No rashes, no synovitis   Data Reviewed: I have personally reviewed following labs and imaging studies Basic Metabolic Panel: Recent Labs  Lab 05/03/23 1302 05/04/23 0455 05/05/23 0536 05/06/23 0448  NA 136 136 135 134*  K 4.0 3.6 4.0 3.7  CL 93* 90* 90* 90*  CO2 34* 35* 36* 34*  GLUCOSE 138* 98 106* 96  BUN 25* 26* 30* 30*  CREATININE 1.40* 1.66* 1.69* 1.74*  CALCIUM 8.2* 7.6* 7.2* 7.0*  MG  --  1.9 2.0  --    Liver Function Tests: Recent Labs  Lab 05/03/23 1302  AST 25  ALT 15  ALKPHOS 47  BILITOT 1.0  PROT 6.8  ALBUMIN 3.8   No results for input(s): "LIPASE", "AMYLASE" in the last 168 hours. No results for input(s): "AMMONIA" in the last 168 hours. Coagulation Profile: No results for input(s): "INR", "PROTIME" in the last 168 hours. CBC: Recent Labs  Lab 05/03/23 1302 05/04/23 0455 05/05/23 0536 05/06/23 0448  WBC 2.7* 2.8*  2.4* 2.4*  NEUTROABS 2.1  --   --   --   HGB 8.4* 7.3* 7.6* 7.5*  HCT 27.7* 24.6* 24.5* 23.7*  MCV 89.4 89.5 88.4 88.8  PLT 98* 86* 86* 83*   Cardiac Enzymes: No results for input(s): "CKTOTAL", "CKMB", "CKMBINDEX", "TROPONINI" in the last 168 hours. BNP: Invalid input(s): "POCBNP" CBG: No results for input(s): "GLUCAP" in the last 168 hours. HbA1C: No results for input(s): "HGBA1C" in the last 72 hours. Urine analysis:    Component Value Date/Time   COLORURINE AMBER (A) 08/26/2022 2205   APPEARANCEUR HAZY (A) 08/26/2022 2205   APPEARANCEUR Clear 10/11/2019 1326   LABSPEC 1.021 08/26/2022 2205   PHURINE 5.0 08/26/2022 2205   GLUCOSEU NEGATIVE 08/26/2022 2205   HGBUR SMALL (A) 08/26/2022  2205   BILIRUBINUR NEGATIVE 08/26/2022 2205   BILIRUBINUR Negative 10/11/2019 1326   KETONESUR NEGATIVE 08/26/2022 2205   PROTEINUR 100 (A) 08/26/2022 2205   UROBILINOGEN 0.2 11/27/2007 1051   NITRITE NEGATIVE 08/26/2022 2205   LEUKOCYTESUR NEGATIVE 08/26/2022 2205   Sepsis Labs: @LABRCNTIP (procalcitonin:4,lacticidven:4) ) Recent Results (from the past 240 hour(s))  SARS Coronavirus 2 by RT PCR (hospital order, performed in [facility] hospital lab) *cepheid single result test* Anterior Nasal Swab     Status: None   Collection Time: 05/03/23  1:02 PM   Specimen: Anterior Nasal Swab  Result Value Ref Range Status   SARS Coronavirus 2 by RT PCR NEGATIVE NEGATIVE Final    Comment: (NOTE) SARS-CoV-2 target nucleic acids are NOT DETECTED.  The SARS-CoV-2 RNA is generally detectable in upper and lower respiratory specimens during the acute phase of infection. The lowest concentration of SARS-CoV-2 viral copies this assay can detect is 250 copies / mL. A negative result does not preclude SARS-CoV-2 infection and should not be used as the sole basis for treatment or other patient management decisions.  A negative result may occur with improper specimen collection / handling, submission of  specimen other than nasopharyngeal swab, presence of viral mutation(s) within the areas targeted by this assay, and inadequate number of viral copies (<250 copies / mL). A negative result must be combined with clinical observations, patient history, and epidemiological information.  Fact Sheet for Patients:   RoadLapTop.co.za  Fact Sheet for Healthcare Providers: http://kim-miller.com/  This test is not yet approved or  cleared by the [place] FDA and has been authorized for detection and/or diagnosis of SARS-CoV-2 by FDA under an Emergency Use Authorization (EUA).  This EUA will remain in effect (meaning this test can be used) for the duration of the COVID-19 declaration under Section 564(b)(1) of the Act, 21 U.S.C. section 360bbb-3(b)(1), unless the authorization is terminated or revoked sooner.  Performed at [facility], [address], [place], [place] [address]      Scheduled Meds:  sodium chloride   Intravenous Once   atorvastatin  10 mg Oral QHS   bisoprolol  5 mg Oral Daily   ferrous sulfate  325 mg Oral Q breakfast   flecainide  50 mg Oral BID   furosemide  40 mg Intravenous BID   hydrALAZINE  50 mg Oral BID   letrozole  2.5 mg Oral QHS   levothyroxine  75 mcg Oral QAC breakfast   losartan  25 mg Oral Daily   mirtazapine  7.5 mg Oral QHS   pantoprazole  80 mg Oral Daily   sodium chloride flush  3 mL Intravenous Q12H   Continuous Infusions:  sodium chloride      Procedures/Studies: DG CHEST PORT 1 VIEW  Result Date: 05/05/2023 CLINICAL DATA:  Flash pulmonary edema. EXAM: PORTABLE CHEST 1 VIEW COMPARISON:  May 04, 2023. FINDINGS: Stable cardiomegaly with mild central pulmonary vascular congestion. Possible minimal bibasilar pulmonary edema is noted. Bony thorax is unremarkable. IMPRESSION: Stable cardiomegaly with mild central pulmonary vascular congestion and possible minimal bibasilar pulmonary edema.  Electronically Signed   By: Lupita Raider M.D.   On: 05/05/2023 10:23   DG Chest Port 1 View  Result Date: 05/04/2023 CLINICAL DATA:  Acute heart failure. EXAM: PORTABLE CHEST 1 VIEW COMPARISON:  May 03, 2023. FINDINGS: Stable cardiomegaly with mild central pulmonary vascular congestion. Probable minimal bibasilar pulmonary edema is noted. Small right pleural effusion is noted. Bony thorax is unremarkable. IMPRESSION: Stable cardiomegaly  with mild central pulmonary vascular congestion and probable bibasilar pulmonary edema. Small right pleural effusion. Electronically Signed   By: Lupita Raider M.D.   On: 05/04/2023 08:40   DG Chest Portable 1 View  Result Date: 05/03/2023 CLINICAL DATA:  Short of breath, worsening over the last week. EXAM: PORTABLE CHEST 1 VIEW COMPARISON:  04/17/2023 and older exams.  CT, 09/27/2008. FINDINGS: Stable enlargement of the cardiac silhouette. No mediastinal or hilar masses. Lungs are hyperexpanded. Bilateral interstitial thickening. There hazy lung opacities that most prominent in the mid to lower lungs. Bilateral pleural effusions obscure the hemidiaphragms. No pneumothorax. Skeletal structures are grossly intact. IMPRESSION: 1. Cardiomegaly, interstitial thickening and hazy airspace lung opacities suspected to be due to congestive heart failure with pulmonary edema, similar in appearance to the most recent prior study. Electronically Signed   By: Amie Portland M.D.   On: 05/03/2023 13:26   DG Chest 2 View  Result Date: 04/17/2023 CLINICAL DATA:  CHF.  Hypoxia. EXAM: CHEST - 2 VIEW COMPARISON:  Chest radiographs 04/02/2023 FINDINGS: The cardiac silhouette remains enlarged. Aortic atherosclerosis is noted. The lungs remain hyperinflated. Prominence of the interstitial markings bilaterally is similar to the prior study. A small right pleural effusion has mildly enlarged. There is a persistent trace left pleural effusion. A new small opacity in the right mid lung  may reflect a small amount of fluid in the minor fissure or atelectasis. No pneumothorax is identified. No acute osseous abnormality is seen. IMPRESSION: Cardiomegaly and similar interstitial densities which could reflect mild edema. Increased, small right pleural effusion an unchanged trace left pleural effusion. Electronically Signed   By: Sebastian Ache M.D.   On: 04/17/2023 08:58    Catarina Hartshorn, DO  [facility]  If 7PM-7AM, please contact night-coverage [email or web address] Password [email or web address] 05/06/2023, 3:30 PM   LOS: 3 days

## 2023-05-06 NOTE — Progress Notes (Signed)
Patient has rested well.  Patient did not fall asleep until 0200.  Vitals have been stable.  Patient still states she feels short of breathe with activity. Oxygen has been stable on 2 liters.

## 2023-05-07 DIAGNOSIS — I5033 Acute on chronic diastolic (congestive) heart failure: Secondary | ICD-10-CM | POA: Diagnosis not present

## 2023-05-07 DIAGNOSIS — N1832 Chronic kidney disease, stage 3b: Secondary | ICD-10-CM

## 2023-05-07 DIAGNOSIS — J9621 Acute and chronic respiratory failure with hypoxia: Secondary | ICD-10-CM | POA: Diagnosis not present

## 2023-05-07 LAB — TYPE AND SCREEN
ABO/RH(D): A POS
Antibody Screen: NEGATIVE
Unit division: 0

## 2023-05-07 LAB — CBC
HCT: 29.7 % — ABNORMAL LOW (ref 36.0–46.0)
Hemoglobin: 9.6 g/dL — ABNORMAL LOW (ref 12.0–15.0)
MCH: 28.5 pg (ref 26.0–34.0)
MCHC: 32.3 g/dL (ref 30.0–36.0)
MCV: 88.1 fL (ref 80.0–100.0)
Platelets: 89 10*3/uL — ABNORMAL LOW (ref 150–400)
RBC: 3.37 MIL/uL — ABNORMAL LOW (ref 3.87–5.11)
RDW: 15.5 % (ref 11.5–15.5)
WBC: 2.7 10*3/uL — ABNORMAL LOW (ref 4.0–10.5)
nRBC: 0 % (ref 0.0–0.2)

## 2023-05-07 LAB — BASIC METABOLIC PANEL
Anion gap: 11 (ref 5–15)
BUN: 27 mg/dL — ABNORMAL HIGH (ref 8–23)
CO2: 33 mmol/L — ABNORMAL HIGH (ref 22–32)
Calcium: 7.2 mg/dL — ABNORMAL LOW (ref 8.9–10.3)
Chloride: 93 mmol/L — ABNORMAL LOW (ref 98–111)
Creatinine, Ser: 1.5 mg/dL — ABNORMAL HIGH (ref 0.44–1.00)
GFR, Estimated: 34 mL/min — ABNORMAL LOW (ref >60)
Glucose, Bld: 102 mg/dL — ABNORMAL HIGH (ref 70–99)
Potassium: 3.8 mmol/L (ref 3.5–5.1)
Sodium: 137 mmol/L (ref 135–145)

## 2023-05-07 LAB — FOLATE: Folate: 18.5 ng/mL (ref >5.9)

## 2023-05-07 LAB — BPAM RBC
Blood Product Expiration Date: 202410212359
ISSUE DATE / TIME: 202409181820
Unit Type and Rh: 6200

## 2023-05-07 LAB — MAGNESIUM: Magnesium: 2 mg/dL (ref 1.7–2.4)

## 2023-05-07 MED ORDER — DARBEPOETIN ALFA 60 MCG/0.3ML IJ SOSY
60.0000 ug | PREFILLED_SYRINGE | Freq: Once | INTRAMUSCULAR | Status: AC
  Administered 2023-05-08: 60 ug via SUBCUTANEOUS
  Filled 2023-05-07: qty 0.3

## 2023-05-07 NOTE — Progress Notes (Signed)
   05/07/23 1000  ReDS Vest / Clip  Station Marker A  Ruler Value 26  ReDS Value Range 36 - 40

## 2023-05-07 NOTE — Plan of Care (Signed)
Problem: Education: Goal: Knowledge of General Education information will improve Description Including pain rating scale, medication(s)/side effects and non-pharmacologic comfort measures Outcome: Progressing   Problem: Health Behavior/Discharge Planning: Goal: Ability to manage health-related needs will improve Outcome: Progressing

## 2023-05-07 NOTE — Progress Notes (Signed)
PROGRESS NOTE  [name] YNW:[id number] DOB: 16-Jul-1934 DOA: 05/03/2023 PCP: [name], DO  Brief History:  89 year old female with history of right breast cancer, pancytopenia, stage IIIb CKD, hypertension, hyperlipidemia, atrial fibrillation, moderate to severe aortic stenosis, hypothyroidism and other past medical history detailed below presents to the emergency department with shortness of breath.  Patient had been hospitalized 04/02/2023 through 04/04/2023 for acute diastolic heart failure associated with acute hypoxic respiratory failure and was discharged on Lasix 40 mg daily which she has been taking regularly.  She had to increase her dose to twice daily for 3 days due to weight gain and has returned to regular dose.  She now is starting to gain weight again.  She is having more peripheral edema.  She is also having more weakness and shortness of breath.  She denies chest pain.  She was discharged on chronic oxygen 2 L but has had to increase the oxygen to 4 L in the past several days due to shortness of breath.  Her home oxygen saturation readings have been in the low 90s down into the 80s.  She recently saw her pulmonologist and is being worked up with an outpatient high-resolution CT scan of the chest to rule out ILD.  This is being worked up due to her increasing oxygen requirements.  In the ED she was noted to have an elevated BNP and peripheral edema and her chest x-ray showed pulmonary edema.  She was started on IV furosemide and admission was requested.   Assessment/Plan: Acute on chronic HF preserved EF  - 04/03/23 Echo--EF 60-65%, normal RVF, G2DD, mod-severe AS,  mild MR/TR, moderate MS - this is second hospitalization in 1 month for heart failure despite compliance with diuretics  - pt wants to discuss potential for TAVR with her cardiologist Dr. Domenic Moras on 05/14/23 at 1030AM  - 9/18 increase furosemide to bid IV - continue to monitor weight,  intake/output, ReDs vest reading and electrolytes - ReDS = 38 on 9/18--still has JVD - chest xray 9/16 still showing findings of pulmonary edema  CKD 4 -baseline creatinine 1.5-1.8 -monitor with diuresis   Essential Hypertension  - resume home medication--Zebeta, hydralazine - d/c losartan due to rising serum creatinine - holding prn clonidine for now  - IV hydralazine ordered for SBP>180    Acute on chronic respiratory failure with hypoxia  - secondary to pulmonary edema  - pt is now increased oxygen requirement now on 4L/min   - wean oxygen as able down to baseline 2L/min    Hypothyroidism  - resume home levothyroxine daily    GERD - resumed home pantoprazole 80 mg daily    Bradycardia - asymptomatic - HR in 50s in last 24 hours, she was kept on her home cardiac meds - pt currently asymptomatic but if persists we may need to adjust her HR lowering medications   DNR present on admission  - reviewed ACP documents - continue DNR order in hospital    Pancytopenia  - her hematologists suspects MDS - pt declined bone marrow biopsy due to advanced age - pt recently started receiving retacrit/procrit injections every 2 weeks in cancer center and surveillance - continue iron sulfate, B12 supplementation  - Dr. Laural Benes left message with [facility] 9/17 about procrit injection - have not heard back: update: Dr Ellin Saba said that pt can have procrit injection in hospital but I learned that our inpatient pharmacy doesn't stock it  and would have to be substituted with arenesp. Would discuss with pharm D on 9/18 about giving an equivalent dose to the 10,000 units of procrit that she normally receives every 2 weeks.   Right Breast Cancer - pt seems to be in remission now - she is followed by oncology and they plan to have her stop taking letrozole at the end of Oct 2024.            Family Communication:  daughter updated 9/19   Consultants:  none   Code Status:  DNR    DVT Prophylaxis:  SCDs     Procedures: As Listed in Progress Note Above   Antibiotics: None         Subjective: Pt states sob is improving.  Ambulated around nursing station without sob.  Denies cp, n/v/d, f/c  Objective: Vitals:   05/06/23 2019 05/06/23 2108 05/07/23 0324 05/07/23 1249  BP: 128/70 (!) 174/53 (!) 151/50 (!) 147/47  Pulse: (!) 58 (!) 59 (!) 52 62  Resp: 18 18 18 17   Temp: 98.5 F (36.9 C) 98.3 F (36.8 C) 98.6 F (37 C) 98.5 F (36.9 C)  TempSrc: Oral Oral Oral Oral  SpO2: 95% 96% 98% 98%  Weight:   49.5 kg   Height:   5\' 4"  (1.626 m)     Intake/Output Summary (Last 24 hours) at 05/07/2023 1741 Last data filed at 05/07/2023 1300 Gross per 24 hour  Intake 855 ml  Output 1200 ml  Net -345 ml   Weight change: 0.5 kg Exam:  General:  Pt is alert, follows commands appropriately, not in acute distress HEENT: No icterus, No thrush, No neck mass, [place]/AT Cardiovascular: RRR, S1/S2, no rubs, no gallops Respiratory: fine bibasilar crackles. No wheeze Abdomen: Soft/+BS, non tender, non distended, no guarding Extremities: No edema, No lymphangitis, No petechiae, No rashes, no synovitis   Data Reviewed: I have personally reviewed following labs and imaging studies Basic Metabolic Panel: Recent Labs  Lab 05/03/23 1302 05/04/23 0455 05/05/23 0536 05/06/23 0448 05/07/23 0445  NA 136 136 135 134* 137  K 4.0 3.6 4.0 3.7 3.8  CL 93* 90* 90* 90* 93*  CO2 34* 35* 36* 34* 33*  GLUCOSE 138* 98 106* 96 102*  BUN 25* 26* 30* 30* 27*  CREATININE 1.40* 1.66* 1.69* 1.74* 1.50*  CALCIUM 8.2* 7.6* 7.2* 7.0* 7.2*  MG  --  1.9 2.0  --  2.0   Liver Function Tests: Recent Labs  Lab 05/03/23 1302  AST 25  ALT 15  ALKPHOS 47  BILITOT 1.0  PROT 6.8  ALBUMIN 3.8   No results for input(s): "LIPASE", "AMYLASE" in the last 168 hours. No results for input(s): "AMMONIA" in the last 168 hours. Coagulation Profile: No results for input(s): "INR", "PROTIME" in  the last 168 hours. CBC: Recent Labs  Lab 05/03/23 1302 05/04/23 0455 05/05/23 0536 05/06/23 0448 05/07/23 0445  WBC 2.7* 2.8* 2.4* 2.4* 2.7*  NEUTROABS 2.1  --   --   --   --   HGB 8.4* 7.3* 7.6* 7.5* 9.6*  HCT 27.7* 24.6* 24.5* 23.7* 29.7*  MCV 89.4 89.5 88.4 88.8 88.1  PLT 98* 86* 86* 83* 89*   Cardiac Enzymes: No results for input(s): "CKTOTAL", "CKMB", "CKMBINDEX", "TROPONINI" in the last 168 hours. BNP: Invalid input(s): "POCBNP" CBG: No results for input(s): "GLUCAP" in the last 168 hours. HbA1C: No results for input(s): "HGBA1C" in the last 72 hours. Urine analysis:    Component Value Date/Time  COLORURINE AMBER (A) 08/26/2022 2205   APPEARANCEUR HAZY (A) 08/26/2022 2205   APPEARANCEUR Clear 10/11/2019 1326   LABSPEC 1.021 08/26/2022 2205   PHURINE 5.0 08/26/2022 2205   GLUCOSEU NEGATIVE 08/26/2022 2205   HGBUR SMALL (A) 08/26/2022 2205   BILIRUBINUR NEGATIVE 08/26/2022 2205   BILIRUBINUR Negative 10/11/2019 1326   KETONESUR NEGATIVE 08/26/2022 2205   PROTEINUR 100 (A) 08/26/2022 2205   UROBILINOGEN 0.2 11/27/2007 1051   NITRITE NEGATIVE 08/26/2022 2205   LEUKOCYTESUR NEGATIVE 08/26/2022 2205   Sepsis Labs: @LABRCNTIP (procalcitonin:4,lacticidven:4) ) Recent Results (from the past 240 hour(s))  SARS Coronavirus 2 by RT PCR (hospital order, performed in [facility] hospital lab) *cepheid single result test* Anterior Nasal Swab     Status: None   Collection Time: 05/03/23  1:02 PM   Specimen: Anterior Nasal Swab  Result Value Ref Range Status   SARS Coronavirus 2 by RT PCR NEGATIVE NEGATIVE Final    Comment: (NOTE) SARS-CoV-2 target nucleic acids are NOT DETECTED.  The SARS-CoV-2 RNA is generally detectable in upper and lower respiratory specimens during the acute phase of infection. The lowest concentration of SARS-CoV-2 viral copies this assay can detect is 250 copies / mL. A negative result does not preclude SARS-CoV-2 infection and should not be  used as the sole basis for treatment or other patient management decisions.  A negative result may occur with improper specimen collection / handling, submission of specimen other than nasopharyngeal swab, presence of viral mutation(s) within the areas targeted by this assay, and inadequate number of viral copies (<250 copies / mL). A negative result must be combined with clinical observations, patient history, and epidemiological information.  Fact Sheet for Patients:   RoadLapTop.co.za  Fact Sheet for Healthcare Providers: http://kim-miller.com/  This test is not yet approved or  cleared by the [place] FDA and has been authorized for detection and/or diagnosis of SARS-CoV-2 by FDA under an Emergency Use Authorization (EUA).  This EUA will remain in effect (meaning this test can be used) for the duration of the COVID-19 declaration under Section 564(b)(1) of the Act, 21 U.S.C. section 360bbb-3(b)(1), unless the authorization is terminated or revoked sooner.  Performed at [facility], [address] [place], [place] [address]      Scheduled Meds:  atorvastatin  10 mg Oral QHS   bisoprolol  5 mg Oral Daily   ferrous sulfate  325 mg Oral Q breakfast   flecainide  50 mg Oral BID   furosemide  40 mg Intravenous BID   hydrALAZINE  50 mg Oral BID   letrozole  2.5 mg Oral QHS   levothyroxine  75 mcg Oral QAC breakfast   mirtazapine  7.5 mg Oral QHS   pantoprazole  80 mg Oral Daily   sodium chloride flush  3 mL Intravenous Q12H   Continuous Infusions:  sodium chloride      Procedures/Studies: DG CHEST PORT 1 VIEW  Result Date: 05/05/2023 CLINICAL DATA:  Flash pulmonary edema. EXAM: PORTABLE CHEST 1 VIEW COMPARISON:  May 04, 2023. FINDINGS: Stable cardiomegaly with mild central pulmonary vascular congestion. Possible minimal bibasilar pulmonary edema is noted. Bony thorax is unremarkable. IMPRESSION: Stable cardiomegaly  with mild central pulmonary vascular congestion and possible minimal bibasilar pulmonary edema. Electronically Signed   By: Lupita Raider M.D.   On: 05/05/2023 10:23   DG Chest Port 1 View  Result Date: 05/04/2023 CLINICAL DATA:  Acute heart failure. EXAM: PORTABLE CHEST 1 VIEW COMPARISON:  May 03, 2023. FINDINGS: Stable cardiomegaly  with mild central pulmonary vascular congestion. Probable minimal bibasilar pulmonary edema is noted. Small right pleural effusion is noted. Bony thorax is unremarkable. IMPRESSION: Stable cardiomegaly with mild central pulmonary vascular congestion and probable bibasilar pulmonary edema. Small right pleural effusion. Electronically Signed   By: Lupita Raider M.D.   On: 05/04/2023 08:40   DG Chest Portable 1 View  Result Date: 05/03/2023 CLINICAL DATA:  Short of breath, worsening over the last week. EXAM: PORTABLE CHEST 1 VIEW COMPARISON:  04/17/2023 and older exams.  CT, 09/27/2008. FINDINGS: Stable enlargement of the cardiac silhouette. No mediastinal or hilar masses. Lungs are hyperexpanded. Bilateral interstitial thickening. There hazy lung opacities that most prominent in the mid to lower lungs. Bilateral pleural effusions obscure the hemidiaphragms. No pneumothorax. Skeletal structures are grossly intact. IMPRESSION: 1. Cardiomegaly, interstitial thickening and hazy airspace lung opacities suspected to be due to congestive heart failure with pulmonary edema, similar in appearance to the most recent prior study. Electronically Signed   By: Amie Portland M.D.   On: 05/03/2023 13:26   DG Chest 2 View  Result Date: 04/17/2023 CLINICAL DATA:  CHF.  Hypoxia. EXAM: CHEST - 2 VIEW COMPARISON:  Chest radiographs 04/02/2023 FINDINGS: The cardiac silhouette remains enlarged. Aortic atherosclerosis is noted. The lungs remain hyperinflated. Prominence of the interstitial markings bilaterally is similar to the prior study. A small right pleural effusion has mildly  enlarged. There is a persistent trace left pleural effusion. A new small opacity in the right mid lung may reflect a small amount of fluid in the minor fissure or atelectasis. No pneumothorax is identified. No acute osseous abnormality is seen. IMPRESSION: Cardiomegaly and similar interstitial densities which could reflect mild edema. Increased, small right pleural effusion an unchanged trace left pleural effusion. Electronically Signed   By: Sebastian Ache M.D.   On: 04/17/2023 08:58    Catarina Hartshorn, DO  [facility]  If 7PM-7AM, please contact night-coverage [email or web address] Password [email or web address] 05/07/2023, 5:41 PM   LOS: 4 days

## 2023-05-07 NOTE — TOC Progression Note (Signed)
Transition of Care [facility]) - Progression Note    Patient Details  Name: [name] MRN: [id number] Date of Birth: Jun 02, 1934  Transition [facility] CM/SW Contact  Villa Herb, [place] Phone Number: 05/07/2023, 11:52 AM  Clinical Narrative:    CSW spoke with pt in room at bedside to review PT recommendation for [facility]. Pt states that she does not feel this is needed at this time. CSW explained that if she changes her mind she can let her RN know to reach out to [facility] or reach out to her PCP if needed. Pt is understanding. TOC signing off.   Expected Discharge Plan: Home/Self Care Barriers to Discharge: Continued Medical Work up  Expected Discharge Plan and Services In-house Referral: Clinical Social Work Discharge Planning Services: CM Consult   Living arrangements for the past 2 months: Single Family Home                                       Social Determinants of Health (SDOH) Interventions SDOH Screenings   Food Insecurity: No Food Insecurity (05/03/2023)  Housing: Low Risk  (05/03/2023)  Transportation Needs: No Transportation Needs (05/03/2023)  Utilities: Not At Risk (05/03/2023)  Alcohol Screen: Low Risk  (12/15/2022)  Depression (PHQ2-9): Low Risk  (04/17/2023)  Financial Resource Strain: Low Risk  (12/15/2022)  Physical Activity: Inactive (12/15/2022)  Social Connections: Moderately Isolated (12/15/2022)  Stress: No Stress Concern Present (12/15/2022)  Tobacco Use: Low Risk  (05/03/2023)    Readmission Risk Interventions    05/04/2023   11:02 AM 04/03/2023   11:33 AM 08/27/2022   11:36 AM  Readmission Risk Prevention Plan  Transportation Screening Complete Complete Complete  HRI or Home Care Consult  Complete   Social Work Consult for Recovery Care Planning/Counseling  Complete   Palliative Care Screening  Not Applicable   Medication Review Oceanographer) Complete Complete Complete  HRI or Home Care Consult Complete  Complete  SW Recovery Care/Counseling  Consult Complete  Complete  Palliative Care Screening Not Applicable  Not Applicable  Skilled Nursing Facility Not Applicable  Not Applicable

## 2023-05-08 ENCOUNTER — Other Ambulatory Visit: Payer: Self-pay | Admitting: *Deleted

## 2023-05-08 DIAGNOSIS — J9621 Acute and chronic respiratory failure with hypoxia: Secondary | ICD-10-CM | POA: Diagnosis not present

## 2023-05-08 DIAGNOSIS — I5032 Chronic diastolic (congestive) heart failure: Secondary | ICD-10-CM

## 2023-05-08 DIAGNOSIS — I5033 Acute on chronic diastolic (congestive) heart failure: Secondary | ICD-10-CM | POA: Diagnosis not present

## 2023-05-08 DIAGNOSIS — N1831 Chronic kidney disease, stage 3a: Secondary | ICD-10-CM

## 2023-05-08 LAB — CBC
HCT: 33 % — ABNORMAL LOW (ref 36.0–46.0)
Hemoglobin: 10 g/dL — ABNORMAL LOW (ref 12.0–15.0)
MCH: 27.2 pg (ref 26.0–34.0)
MCHC: 30.3 g/dL (ref 30.0–36.0)
MCV: 89.7 fL (ref 80.0–100.0)
Platelets: 92 10*3/uL — ABNORMAL LOW (ref 150–400)
RBC: 3.68 MIL/uL — ABNORMAL LOW (ref 3.87–5.11)
RDW: 15.6 % — ABNORMAL HIGH (ref 11.5–15.5)
WBC: 2.9 10*3/uL — ABNORMAL LOW (ref 4.0–10.5)
nRBC: 0 % (ref 0.0–0.2)

## 2023-05-08 LAB — BASIC METABOLIC PANEL
Anion gap: 12 (ref 5–15)
BUN: 33 mg/dL — ABNORMAL HIGH (ref 8–23)
CO2: 33 mmol/L — ABNORMAL HIGH (ref 22–32)
Calcium: 7.6 mg/dL — ABNORMAL LOW (ref 8.9–10.3)
Chloride: 92 mmol/L — ABNORMAL LOW (ref 98–111)
Creatinine, Ser: 1.54 mg/dL — ABNORMAL HIGH (ref 0.44–1.00)
GFR, Estimated: 32 mL/min — ABNORMAL LOW (ref >60)
Glucose, Bld: 100 mg/dL — ABNORMAL HIGH (ref 70–99)
Potassium: 4 mmol/L (ref 3.5–5.1)
Sodium: 137 mmol/L (ref 135–145)

## 2023-05-08 LAB — MAGNESIUM: Magnesium: 2.1 mg/dL (ref 1.7–2.4)

## 2023-05-08 MED ORDER — TORSEMIDE 20 MG PO TABS: 40.0000 mg | ORAL_TABLET | Freq: Every day | ORAL | Status: DC

## 2023-05-08 MED ORDER — TORSEMIDE 20 MG PO TABS: 40.0000 mg | ORAL_TABLET | Freq: Every day | ORAL | 1 refills | Status: DC

## 2023-05-08 NOTE — Care Management Important Message (Signed)
Important Message  Patient Details  Name: [name] MRN: [id number] Date of Birth: [birth year]   Medicare Important Message Given:  Yes     Corey Harold 05/08/2023, 11:54 AM

## 2023-05-08 NOTE — Discharge Summary (Signed)
Physician Discharge Summary   Patient: [name] MRN: [id number] DOB: 09/07/1933  Admit date:     05/03/2023  Discharge date: 05/08/23  Discharge Physician: Onalee Hua Cutter Passey   PCP: [name], DO   Recommendations at discharge:   Please follow up with primary care provider within 1-2 weeks  Please repeat BMP and CBC in one week     Hospital Course: 89 year old female with history of right breast cancer, pancytopenia, stage IIIb CKD, hypertension, hyperlipidemia, atrial fibrillation, moderate to severe aortic stenosis, hypothyroidism and other past medical history detailed below presents to the emergency department with shortness of breath.  Patient had been hospitalized 04/02/2023 through 04/04/2023 for acute diastolic heart failure associated with acute hypoxic respiratory failure and was discharged on Lasix 40 mg daily which she has been taking regularly.  She had to increase her dose to twice daily for 3 days due to weight gain and has returned to regular dose.  She now is starting to gain weight again.  She is having more peripheral edema.  She is also having more weakness and shortness of breath.  She denies chest pain.  She was discharged on chronic oxygen 2 L but has had to increase the oxygen to 4 L in the past several days due to shortness of breath.  Her home oxygen saturation readings have been in the low 90s down into the 80s.  She recently saw her pulmonologist and is being worked up with an outpatient high-resolution CT scan of the chest to rule out ILD.  This is being worked up due to her increasing oxygen requirements.  In the ED she was noted to have an elevated BNP and peripheral edema and her chest x-ray showed pulmonary edema.  She was started on IV furosemide and admission was requested.  Assessment and Plan: Acute on chronic HF preserved EF  - 04/03/23 Echo--EF 60-65%, normal RVF, G2DD, mod-severe AS,  mild MR/TR, moderate MS - this is second hospitalization in 1 month  for heart failure despite compliance with diuretics  - pt wants to discuss potential for TAVR with her cardiologist Dr. Domenic Moras on 05/14/23 at 1030AM  - 9/18 increased furosemide to bid IV - continue to monitor weight, intake/output, ReDs vest reading and electrolytes - ReDS = 38 on 9/18--still had JVD - ReDS  = 33 on day of d/c -d/c home with torsemide 40 mg q AM -weight 110>>106 lbs on day of d/c   CKD 4 -baseline creatinine 1.5-1.8 -monitor with diuresis -serum creatinine 1.54 on day of d/c   Essential Hypertension  - resume home medication--Zebeta, hydralazine - d/c losartan due to rising serum creatinine - holding prn clonidine during hospitalization - IV hydralazine ordered for SBP>180    Acute on chronic respiratory failure with hypoxia  - secondary to pulmonary edema  - pt is now increased oxygen requirement now on 4L/min [place]  - wean oxygen as able down to baseline 2L/min  - stable on 2L on day of d/c   Hypothyroidism  - resume home levothyroxine daily    GERD - resumed home pantoprazole 80 mg daily    Bradycardia - asymptomatic - HR in  upper 50s and low 60s for majority of hospitalization - pt currently asymptomatic   DNR present on admission  - reviewed ACP documents - continue DNR order in hospital    Pancytopenia  - her hematologists suspects MDS - pt declined bone marrow biopsy due to advanced age previously, but now reconsidering - pt recently  started receiving retacrit/procrit injections every 2 weeks in cancer center and surveillance - continue iron sulfate, B12 supplementation  - Dr. Laural Benes left message with [facility] 9/17 about procrit injection - have not heard back: update: Dr Ellin Saba said that pt can have procrit injection in hospital but I learned that our inpatient pharmacy doesn't stock it and would have to be substituted with arenesp. Would discuss with pharm D on 9/18 about giving an equivalent dose to the 10,000 units of procrit  that she normally receives every 2 weeks.   Right Breast Cancer - pt seems to be in remission now - she is followed by oncology and they plan to have her stop taking letrozole at the end of Oct 2024.          Consultants: none Procedures performed: none  Disposition: Home Diet recommendation:  Cardiac diet DISCHARGE MEDICATION: Allergies as of 05/08/2023       Reactions   Norvasc [amlodipine] Swelling   Edema    Tape Other (See Comments)   Has to have no stick tape.   Zestril [lisinopril] Cough   Hytrin [terazosin] Hives, Nausea Only   Imdur [isosorbide Nitrate] Nausea Only   Sulfonamide Derivatives Nausea And Vomiting, Rash        Medication List     STOP taking these medications    furosemide 40 MG tablet Commonly known as: Lasix   losartan 25 MG tablet Commonly known as: COZAAR       TAKE these medications    atorvastatin 20 MG tablet Commonly known as: LIPITOR TAKE 1/2 TABLET BY MOUTH DAILY What changed: when to take this   bisoprolol 5 MG tablet Commonly known as: ZEBETA TAKE 1 TABLET (5 MG TOTAL) BY MOUTH DAILY.   cloNIDine 0.1 MG tablet Commonly known as: CATAPRES Take 1 tablet (0.1 mg total) by mouth as directed. May take one tablet as needed with systolic blood pressure greater than 170   feeding supplement Liqd Take 237 mLs by mouth 2 (two) times daily between meals. What changed:  when to take this reasons to take this   ferrous sulfate 325 (65 FE) MG tablet Take 325 mg by mouth daily with breakfast.   flecainide 50 MG tablet Commonly known as: TAMBOCOR TAKE 1 TABLET BY MOUTH TWICE A DAY   hydrALAZINE 50 MG tablet Commonly known as: APRESOLINE Take 1 tablet (50 mg total) by mouth 2 (two) times daily.   letrozole 2.5 MG tablet Commonly known as: FEMARA TAKE 1 TABLET BY MOUTH EVERY DAY What changed: when to take this   levothyroxine 75 MCG tablet Commonly known as: SYNTHROID TAKE 1 TABLET BY MOUTH EVERY DAY What changed:  when to take this   mirtazapine 7.5 MG tablet Commonly known as: REMERON TAKE 1 TABLET BY MOUTH EVERYDAY AT BEDTIME   Multivitamin Women 50+ Tabs Take 1 tablet by mouth at bedtime.   pantoprazole 40 MG tablet Commonly known as: PROTONIX TAKE 1 TABLET (40 MG TOTAL) BY MOUTH TWICE A DAY BEFORE MEALS What changed: See the new instructions.   torsemide 20 MG tablet Commonly known as: DEMADEX Take 2 tablets (40 mg total) by mouth daily. Start taking on: May 09, 2023   VITAMIN B-12 PO Take 1 tablet by mouth at bedtime.   VITAMIN D-3 PO Take 1 tablet by mouth at bedtime.        Discharge Exam: Filed Weights   05/06/23 0450 05/07/23 0324 05/08/23 0500  Weight: 49 kg 49.5 kg 48.1  kg   HEENT:  [place]/AT, No thrush, no icterus CV:  RRR, no rub, no S3, no S4 Lung:  CTA, no wheeze, no rhonchi Abd:  soft/+BS, NT Ext:  No edema, no lymphangitis, no synovitis, no rash   Condition at discharge: stable  The results of significant diagnostics from this hospitalization (including imaging, microbiology, ancillary and laboratory) are listed below for reference.   Imaging Studies: DG CHEST PORT 1 VIEW  Result Date: 05/05/2023 CLINICAL DATA:  Flash pulmonary edema. EXAM: PORTABLE CHEST 1 VIEW COMPARISON:  May 04, 2023. FINDINGS: Stable cardiomegaly with mild central pulmonary vascular congestion. Possible minimal bibasilar pulmonary edema is noted. Bony thorax is unremarkable. IMPRESSION: Stable cardiomegaly with mild central pulmonary vascular congestion and possible minimal bibasilar pulmonary edema. Electronically Signed   By: Lupita Raider M.D.   On: 05/05/2023 10:23   DG Chest Port 1 View  Result Date: 05/04/2023 CLINICAL DATA:  Acute heart failure. EXAM: PORTABLE CHEST 1 VIEW COMPARISON:  May 03, 2023. FINDINGS: Stable cardiomegaly with mild central pulmonary vascular congestion. Probable minimal bibasilar pulmonary edema is noted. Small right pleural effusion is  noted. Bony thorax is unremarkable. IMPRESSION: Stable cardiomegaly with mild central pulmonary vascular congestion and probable bibasilar pulmonary edema. Small right pleural effusion. Electronically Signed   By: Lupita Raider M.D.   On: 05/04/2023 08:40   DG Chest Portable 1 View  Result Date: 05/03/2023 CLINICAL DATA:  Short of breath, worsening over the last week. EXAM: PORTABLE CHEST 1 VIEW COMPARISON:  04/17/2023 and older exams.  CT, 09/27/2008. FINDINGS: Stable enlargement of the cardiac silhouette. No mediastinal or hilar masses. Lungs are hyperexpanded. Bilateral interstitial thickening. There hazy lung opacities that most prominent in the mid to lower lungs. Bilateral pleural effusions obscure the hemidiaphragms. No pneumothorax. Skeletal structures are grossly intact. IMPRESSION: 1. Cardiomegaly, interstitial thickening and hazy airspace lung opacities suspected to be due to congestive heart failure with pulmonary edema, similar in appearance to the most recent prior study. Electronically Signed   By: Amie Portland M.D.   On: 05/03/2023 13:26   DG Chest 2 View  Result Date: 04/17/2023 CLINICAL DATA:  CHF.  Hypoxia. EXAM: CHEST - 2 VIEW COMPARISON:  Chest radiographs 04/02/2023 FINDINGS: The cardiac silhouette remains enlarged. Aortic atherosclerosis is noted. The lungs remain hyperinflated. Prominence of the interstitial markings bilaterally is similar to the prior study. A small right pleural effusion has mildly enlarged. There is a persistent trace left pleural effusion. A new small opacity in the right mid lung may reflect a small amount of fluid in the minor fissure or atelectasis. No pneumothorax is identified. No acute osseous abnormality is seen. IMPRESSION: Cardiomegaly and similar interstitial densities which could reflect mild edema. Increased, small right pleural effusion an unchanged trace left pleural effusion. Electronically Signed   By: Sebastian Ache M.D.   On: 04/17/2023 08:58     Microbiology: Results for orders placed or performed during the hospital encounter of 05/03/23  SARS Coronavirus 2 by RT PCR (hospital order, performed in [facility] hospital lab) *cepheid single result test* Anterior Nasal Swab     Status: None   Collection Time: 05/03/23  1:02 PM   Specimen: Anterior Nasal Swab  Result Value Ref Range Status   SARS Coronavirus 2 by RT PCR NEGATIVE NEGATIVE Final    Comment: (NOTE) SARS-CoV-2 target nucleic acids are NOT DETECTED.  The SARS-CoV-2 RNA is generally detectable in upper and lower respiratory specimens during the acute phase of infection. The  lowest concentration of SARS-CoV-2 viral copies this assay can detect is 250 copies / mL. A negative result does not preclude SARS-CoV-2 infection and should not be used as the sole basis for treatment or other patient management decisions.  A negative result may occur with improper specimen collection / handling, submission of specimen other than nasopharyngeal swab, presence of viral mutation(s) within the areas targeted by this assay, and inadequate number of viral copies (<250 copies / mL). A negative result must be combined with clinical observations, patient history, and epidemiological information.  Fact Sheet for Patients:   RoadLapTop.co.za  Fact Sheet for Healthcare Providers: http://kim-miller.com/  This test is not yet approved or  cleared by the [place] FDA and has been authorized for detection and/or diagnosis of SARS-CoV-2 by FDA under an Emergency Use Authorization (EUA).  This EUA will remain in effect (meaning this test can be used) for the duration of the COVID-19 declaration under Section 564(b)(1) of the Act, 21 U.S.C. section 360bbb-3(b)(1), unless the authorization is terminated or revoked sooner.  Performed at [facility], [address] [place], [place] [address]    *Note: Due to a large number of results and/or  encounters for the requested time period, some results have not been displayed. A complete set of results can be found in Results Review.    Labs: CBC: Recent Labs  Lab 05/03/23 1302 05/04/23 0455 05/05/23 0536 05/06/23 0448 05/07/23 0445 05/08/23 0458  WBC 2.7* 2.8* 2.4* 2.4* 2.7* 2.9*  NEUTROABS 2.1  --   --   --   --   --   HGB 8.4* 7.3* 7.6* 7.5* 9.6* 10.0*  HCT 27.7* 24.6* 24.5* 23.7* 29.7* 33.0*  MCV 89.4 89.5 88.4 88.8 88.1 89.7  PLT 98* 86* 86* 83* 89* 92*   Basic Metabolic Panel: Recent Labs  Lab 05/04/23 0455 05/05/23 0536 05/06/23 0448 05/07/23 0445 05/08/23 0458  NA 136 135 134* 137 137  K 3.6 4.0 3.7 3.8 4.0  CL 90* 90* 90* 93* 92*  CO2 35* 36* 34* 33* 33*  GLUCOSE 98 106* 96 102* 100*  BUN 26* 30* 30* 27* 33*  CREATININE 1.66* 1.69* 1.74* 1.50* 1.54*  CALCIUM 7.6* 7.2* 7.0* 7.2* 7.6*  MG 1.9 2.0  --  2.0 2.1   Liver Function Tests: Recent Labs  Lab 05/03/23 1302  AST 25  ALT 15  ALKPHOS 47  BILITOT 1.0  PROT 6.8  ALBUMIN 3.8   CBG: No results for input(s): "GLUCAP" in the last 168 hours.  Discharge time spent: greater than 30 minutes.  Signed: Catarina Hartshorn, MD [facility] 05/08/2023

## 2023-05-08 NOTE — Consult Note (Signed)
[facility] Customer service manager [facility]) Accountable Care Organization (ACO) [facility] Liaison Note  05/08/2023  ICLE MCCLARD 09-12-1933 [id number]  Location: [facility] Liaison screened the patient remotely at [facility].  Insurance: [employer]   [name] is a 89 y.o. female who is a Primary Care Patient of [name], DO with [facility]. The patient was screened for 30 day readmission hospitalization with noted extreme risk score for unplanned readmission risk with 2 IP in 6 months.  The patient was assessed for potential [facility]) Care Management service needs for post hospital transition for care coordination. Review of patient's electronic medical record reveals patient was admitted with CHF. Liaison spoke with pt today and educated on community case management services. Pt receptive to a nurse coordinator hospital prevention readmission follow up call. Confirmed pt opt to declined [facility] recommendations and has a good support system. Confirmed CHF is a new diagnosis for her and she has Dr. Antoine Poche for her cardiopulmonary provider and ongoing follow up.   Plan: [facility] Liaison will continue to follow progress and disposition to asess for post hospital community care coordination/management needs.  Referral request for community care coordination: Will make a referral for RN care coordinator to follow up with care management services.   [facility] Care Management/Population Health does not replace or interfere with any arrangements made by the Inpatient Transition of Care team.   For questions contact:   Elliot Cousin, RN, [facility] Liaison [place]   Population Health Office Hours MTWF  8:00 am-6:00 pm [phone] mobile [phone] [Office toll free line] Office Hours are M-F 8:30 - 5 pm [email or web address]

## 2023-05-08 NOTE — Progress Notes (Signed)
REDS READING 33%

## 2023-05-08 NOTE — Progress Notes (Signed)
Msg sent to attending from pt asked if she is to still take her lasix as well as the new rx for the torsemide (DEMADEX) 20 MG tablet she said you said "to not throw them away, " but not if she should still take as well.  Awaiting response for clarification for pt teaching

## 2023-05-09 DIAGNOSIS — I5032 Chronic diastolic (congestive) heart failure: Secondary | ICD-10-CM | POA: Diagnosis not present

## 2023-05-09 DIAGNOSIS — Z515 Encounter for palliative care: Secondary | ICD-10-CM | POA: Diagnosis not present

## 2023-05-09 DIAGNOSIS — Z66 Do not resuscitate: Secondary | ICD-10-CM | POA: Diagnosis not present

## 2023-05-11 ENCOUNTER — Telehealth: Payer: Self-pay | Admitting: *Deleted

## 2023-05-11 NOTE — Progress Notes (Signed)
Care Coordination   Note   05/11/2023 Name: [name] MRN: [id number] DOB: 19-Nov-1933  [name] is a 89 y.o. year old female who sees [name], DO for primary care. I reached out to [name] by phone today to offer care coordination services.  Ms. Rattan was given information about Care Coordination services today including:   The Care Coordination services include support from the care team which includes your Nurse Coordinator, Clinical Social Worker, or Pharmacist.  The Care Coordination team is here to help remove barriers to the health concerns and goals most important to you. Care Coordination services are voluntary, and the patient may decline or stop services at any time by request to their care team member.   Care Coordination Consent Status: Patient agreed to services and verbal consent obtained.   Follow up plan:  Telephone appointment with care coordination team member scheduled for:  05-22-23  Encounter Outcome:  Patient Scheduled  [facility] Coordination Care Guide  Direct Dial: [phone]

## 2023-05-12 ENCOUNTER — Ambulatory Visit: Payer: PPO | Admitting: Medical

## 2023-05-12 DIAGNOSIS — X32XXXD Exposure to sunlight, subsequent encounter: Secondary | ICD-10-CM | POA: Diagnosis not present

## 2023-05-12 DIAGNOSIS — Z08 Encounter for follow-up examination after completed treatment for malignant neoplasm: Secondary | ICD-10-CM | POA: Diagnosis not present

## 2023-05-12 DIAGNOSIS — Z85828 Personal history of other malignant neoplasm of skin: Secondary | ICD-10-CM | POA: Diagnosis not present

## 2023-05-12 DIAGNOSIS — L570 Actinic keratosis: Secondary | ICD-10-CM | POA: Diagnosis not present

## 2023-05-13 NOTE — Progress Notes (Unsigned)
Cardiology Office Note:   Date:  05/14/2023  ID:  [name], DOB 10/22/1933, MRN [id number] PCP: Janet Ip, DO  [place]  HeartCare Providers Cardiologist:  Rollene Rotunda, MD {  History of Present Illness:   [name] is a 89 y.o. female  with difficult to control with chronic diastolic CHF, and arrhythmias.  She was in the hospital with hypoxic respiratory failure in October 2023.   I reviewed these records for this visit.  She had pneumonia and a CHF exacerbation.   She had mild AKI with her creat being 1.56 at discharge which was down from 1.93.   She was discharged on O2.  She was again in the hospital on January 2024 with pneumonia. She was sent home on 2 L of oxygen again.  She had some acute on chronic renal insufficiency with a peak creatinine 1.93 and she was mildly hyperkalemic.  He did have follow-up labs yesterday and creatinine is back down to baseline 1.67 and she was not hyperkalemic.  She does have some occasional oxygen.  Since I last saw her she has been in the hospital twice with respiratory failure.  I reviewed the August and September hospitalizations.  In August her hydralazine was reduced and she was started on diuretic prior to discharge.  In September she was changed to torsemide.  Her AST is moderately severe.  Her ejection fraction was well-preserved.  She does have CKD 4 with a baseline creatinine and discharge being around 1.54.    Prior to the August hospitalization she was put back on oxygen.  She had this in January after hospitalization but wanted to stop it so sent it back.  However, her primary provider had restarted it.  This is one of her big complaints that she would like to be off of this although she is doing a little bit better now that she has a concentrator.  She is wearing her oxygen.  She is not weighing herself which she was not doing earlier.  She feels okay since she got out of the hospital last week.  Her weights have been up and down  although.  She is watching her salt and fluid.  She has been watching her salt and fluid.  She is not having any new PND or orthopnea.  She is not having any new chest pressure, neck or arm discomfort.  Of note part of her symptoms were related to her chronic anemia and pancytopenia which is followed by hematology.  She was transfused in the hospital the second time and feels better.  ROS: As stated in the HPI and negative for all other systems.  Studies Reviewed:    EKG:   NA   Risk Assessment/Calculations:         Physical Exam:   VS:  BP (!) 152/50   Pulse (!) 55   Ht 5' 4.5" (1.638 m)   Wt 108 lb (49 kg)   SpO2 97%   BMI 18.25 kg/m    Wt Readings from Last 3 Encounters:  05/14/23 108 lb (49 kg)  05/08/23 106 lb 0.7 oz (48.1 kg)  04/22/23 108 lb (49 kg)     GEN:   Thin and frail, well developed in no acute distress NECK: No JVD; No carotid bruits CARDIAC: RRR, no murmurs, rubs, gallops RESPIRATORY:  Clear to auscultation without rales, wheezing or rhonchi  ABDOMEN: Soft, non-tender, non-distended EXTREMITIES:  No edema; No deformity   ASSESSMENT AND PLAN:   HFpEF:  For now I think she is tolerating the current diuretic and we will have to watch this closely.  We talked at length about as needed diuretics.  We talked about fluid restriction and salt restriction.  In 10 days I will check a basic metabolic profile.   Atrial tachycardia/PAF:   She has had no further sustained tachyarrhythmias.  No change in therapy.  HTN : Her blood pressure is slightly elevated today but it was running low and she had to have her meds reduced.  Therefore, no med titration.  Thrombocytopenia/leukopenia :    This is followed by hematology and her primary provider.  Ao Valve stenosis: This was moderate AS to severe on echo in August 2024.  I had a long discussion with the patient and her daughter about this.  I think she be a high risk candidate for even TAVR.  She is a DNR and has multiple  comorbidities.  In addition her stenosis is moderately severe and not critical.  It is not entirely clear that her symptoms will dramatically improve with TAVR.  She would still have diastolic dysfunction and chronic anemia.  At this point I think we should follow this more conservatively with the meds as listed in the plan as above.  She and her daughter agree.   MR:   Mild MR. I will follow this clinically.  CKD IIIB:     B med is scheduled as above.     Follow up in about 4 weeks in [place].  Signed, Rollene Rotunda, MD

## 2023-05-14 ENCOUNTER — Encounter: Payer: Self-pay | Admitting: Cardiology

## 2023-05-14 ENCOUNTER — Ambulatory Visit: Payer: PPO | Attending: Cardiology | Admitting: Cardiology

## 2023-05-14 VITALS — BP 152/50 | HR 55 | Ht 64.5 in | Wt 108.0 lb

## 2023-05-14 DIAGNOSIS — I10 Essential (primary) hypertension: Secondary | ICD-10-CM | POA: Diagnosis not present

## 2023-05-14 DIAGNOSIS — I350 Nonrheumatic aortic (valve) stenosis: Secondary | ICD-10-CM | POA: Diagnosis not present

## 2023-05-14 DIAGNOSIS — N1832 Chronic kidney disease, stage 3b: Secondary | ICD-10-CM | POA: Diagnosis not present

## 2023-05-14 DIAGNOSIS — I5033 Acute on chronic diastolic (congestive) heart failure: Secondary | ICD-10-CM

## 2023-05-14 DIAGNOSIS — J449 Chronic obstructive pulmonary disease, unspecified: Secondary | ICD-10-CM | POA: Diagnosis not present

## 2023-05-14 DIAGNOSIS — I340 Nonrheumatic mitral (valve) insufficiency: Secondary | ICD-10-CM | POA: Diagnosis not present

## 2023-05-14 NOTE — Patient Instructions (Signed)
Medication Instructions:  No changes *If you need a refill on your cardiac medications before your next appointment, please call your pharmacy*   Lab Work: Bmet in 10 days If you have labs (blood work) drawn today and your tests are completely normal, you will receive your results only by: MyChart Message (if you have MyChart) OR A paper copy in the mail If you have any lab test that is abnormal or we need to change your treatment, we will call you to review the results.   Testing/Procedures: No testing   Follow-Up: At [facility], you and your health needs are our priority.  As part of our continuing mission to provide you with exceptional heart care, we have created designated Provider Care Teams.  These Care Teams include your primary Cardiologist (physician) and Advanced Practice Providers (APPs -  Physician Assistants and Nurse Practitioners) who all work together to provide you with the care you need, when you need it.  We recommend signing up for the patient portal called "MyChart".  Sign up information is provided on this After Visit Summary.  MyChart is used to connect with patients for Virtual Visits (Telemedicine).  Patients are able to view lab/test results, encounter notes, upcoming appointments, etc.  Non-urgent messages can be sent to your provider as well.   To learn more about what you can do with MyChart, go to [email or web address].    Your next appointment:   October 23rd at 11:40  Provider:   Rollene Rotunda, MD

## 2023-05-18 ENCOUNTER — Telehealth: Payer: Self-pay | Admitting: Family Medicine

## 2023-05-18 ENCOUNTER — Other Ambulatory Visit: Payer: PPO

## 2023-05-18 DIAGNOSIS — E034 Atrophy of thyroid (acquired): Secondary | ICD-10-CM

## 2023-05-18 DIAGNOSIS — N184 Chronic kidney disease, stage 4 (severe): Secondary | ICD-10-CM | POA: Diagnosis not present

## 2023-05-18 DIAGNOSIS — I340 Nonrheumatic mitral (valve) insufficiency: Secondary | ICD-10-CM | POA: Diagnosis not present

## 2023-05-18 DIAGNOSIS — Z170 Estrogen receptor positive status [ER+]: Secondary | ICD-10-CM

## 2023-05-18 DIAGNOSIS — I350 Nonrheumatic aortic (valve) stenosis: Secondary | ICD-10-CM | POA: Diagnosis not present

## 2023-05-18 DIAGNOSIS — N1832 Chronic kidney disease, stage 3b: Secondary | ICD-10-CM | POA: Diagnosis not present

## 2023-05-18 DIAGNOSIS — M8580 Other specified disorders of bone density and structure, unspecified site: Secondary | ICD-10-CM

## 2023-05-18 DIAGNOSIS — I5033 Acute on chronic diastolic (congestive) heart failure: Secondary | ICD-10-CM | POA: Diagnosis not present

## 2023-05-18 DIAGNOSIS — I10 Essential (primary) hypertension: Secondary | ICD-10-CM | POA: Diagnosis not present

## 2023-05-18 DIAGNOSIS — D61818 Other pancytopenia: Secondary | ICD-10-CM

## 2023-05-18 NOTE — Telephone Encounter (Signed)
Please add future lab order. Pt coming in this morning at 9:30 for labs.

## 2023-05-18 NOTE — Telephone Encounter (Signed)
Already talked to lab about this.

## 2023-05-19 ENCOUNTER — Ambulatory Visit: Payer: PPO | Admitting: Family Medicine

## 2023-05-19 ENCOUNTER — Encounter: Payer: Self-pay | Admitting: Family Medicine

## 2023-05-19 VITALS — BP 180/58 | HR 80 | Temp 98.8°F | Ht 64.0 in | Wt 106.0 lb

## 2023-05-19 DIAGNOSIS — I5033 Acute on chronic diastolic (congestive) heart failure: Secondary | ICD-10-CM

## 2023-05-19 DIAGNOSIS — Z09 Encounter for follow-up examination after completed treatment for conditions other than malignant neoplasm: Secondary | ICD-10-CM | POA: Diagnosis not present

## 2023-05-19 DIAGNOSIS — J3489 Other specified disorders of nose and nasal sinuses: Secondary | ICD-10-CM | POA: Diagnosis not present

## 2023-05-19 DIAGNOSIS — J9601 Acute respiratory failure with hypoxia: Secondary | ICD-10-CM | POA: Diagnosis not present

## 2023-05-19 LAB — BASIC METABOLIC PANEL
BUN/Creatinine Ratio: 23 (ref 12–28)
BUN: 42 mg/dL — ABNORMAL HIGH (ref 8–27)
CO2: 36 mmol/L — ABNORMAL HIGH (ref 20–29)
Calcium: 9 mg/dL (ref 8.7–10.3)
Chloride: 92 mmol/L — ABNORMAL LOW (ref 96–106)
Creatinine, Ser: 1.84 mg/dL — ABNORMAL HIGH (ref 0.57–1.00)
Glucose: 97 mg/dL (ref 70–99)
Potassium: 4 mmol/L (ref 3.5–5.2)
Sodium: 141 mmol/L (ref 134–144)
eGFR: 26 mL/min/{1.73_m2} — ABNORMAL LOW (ref >59)

## 2023-05-19 LAB — RENAL FUNCTION PANEL
Albumin: 4.2 g/dL (ref 3.7–4.7)
BUN/Creatinine Ratio: 23 (ref 12–28)
BUN: 43 mg/dL — ABNORMAL HIGH (ref 8–27)
CO2: 35 mmol/L — ABNORMAL HIGH (ref 20–29)
Calcium: 9.2 mg/dL (ref 8.7–10.3)
Chloride: 91 mmol/L — ABNORMAL LOW (ref 96–106)
Creatinine, Ser: 1.85 mg/dL — ABNORMAL HIGH (ref 0.57–1.00)
Glucose: 97 mg/dL (ref 70–99)
Phosphorus: 4 mg/dL (ref 3.0–4.3)
Potassium: 4.2 mmol/L (ref 3.5–5.2)
Sodium: 141 mmol/L (ref 134–144)
eGFR: 26 mL/min/{1.73_m2} — ABNORMAL LOW (ref >59)

## 2023-05-19 LAB — TSH: TSH: 2.84 u[IU]/mL (ref 0.450–4.500)

## 2023-05-19 LAB — T4, FREE: Free T4: 1.62 ng/dL (ref 0.82–1.77)

## 2023-05-19 MED ORDER — AZELASTINE HCL 0.1 % NA SOLN
1.0000 | Freq: Two times a day (BID) | NASAL | 12 refills | Status: DC
Start: 2023-05-19 — End: 2023-11-04

## 2023-05-19 NOTE — Progress Notes (Signed)
Subjective: CC: Hospital discharge follow-up PCP: Janet Ip, DO ZOX:WRUEA [name] is a 89 y.o. female presenting to clinic today for:  1.  Hospital discharge follow-up Patient was readmitted for CHF about 2 weeks ago.  She was requiring 4 L via nasal cannula at that time.  She was diuresed and discharged on 40 mg of Demadex daily with instructions to increase by 20 mg if she gains more than 2 pounds.  She has been monitoring weights closely and typically is around 105 pounds.  She reports a good appetite.  She is sleeping well with the mirtazapine.  She reports no bleeding but continues to see hematology very closely for monitoring of her hemoglobin, which was 10 at discharge from the hospital.  She reports that breathing is now back to baseline on her 2 L O2.  She will be seeing her nephrologist at the end of November and has already seen her cardiologist since discharge.  She voices no concerns today except for some rhinorrhea   ROS: Per HPI  Allergies  Allergen Reactions   Norvasc [Amlodipine] Swelling    Edema    Tape Other (See Comments)    Has to have no stick tape.   Zestril [Lisinopril] Cough   Hytrin [Terazosin] Hives and Nausea Only   Imdur [Isosorbide Nitrate] Nausea Only   Sulfonamide Derivatives Nausea And Vomiting and Rash   Past Medical History:  Diagnosis Date   Anemia    Arthritis    Asthma    Atrial fibrillation (HCC)    Basal cell carcinoma 02/06/2009   Left ear targus- ([facility])   Basal cell carcinoma 09/28/2008   Right back-([id number])   CKD (chronic kidney disease), stage III (HCC)    Heart murmur    Hyperlipidemia    Hypertension    Hypothyroidism    Nodule of right lung    Right upper lobe   Osteopenia due to cancer therapy 10/07/2022   Pneumonia    PONV (postoperative nausea and vomiting)    Renal insufficiency    Chronic   Renal vascular disease    Right renal artery stenosis (HCC) 05/09/2015   Squamous cell carcinoma of skin 09/07/2019    KA-Left shin-txpbx   Vertigo     Current Outpatient Medications:    atorvastatin (LIPITOR) 20 MG tablet, TAKE 1/2 TABLET BY MOUTH DAILY (Patient taking differently: Take 10 mg by mouth at bedtime.), Disp: 45 tablet, Rfl: 1   bisoprolol (ZEBETA) 5 MG tablet, TAKE 1 TABLET (5 MG TOTAL) BY MOUTH DAILY., Disp: 90 tablet, Rfl: 3   Cholecalciferol (VITAMIN D-3 PO), Take 1 tablet by mouth at bedtime., Disp: , Rfl:    cloNIDine (CATAPRES) 0.1 MG tablet, Take 1 tablet (0.1 mg total) by mouth as directed. May take one tablet as needed with systolic blood pressure greater than 170, Disp: 30 tablet, Rfl: 1   Cyanocobalamin (VITAMIN Janet-12 PO), Take 1 tablet by mouth at bedtime., Disp: , Rfl:    feeding supplement (ENSURE ENLIVE / ENSURE PLUS) LIQD, Take 237 mLs by mouth 2 (two) times daily between meals. (Patient taking differently: Take 237 mLs by mouth daily as needed (decreased appetite).), Disp: 237 mL, Rfl: 12   ferrous sulfate 325 (65 FE) MG tablet, Take 325 mg by mouth daily with breakfast., Disp: , Rfl:    flecainide (TAMBOCOR) 50 MG tablet, TAKE 1 TABLET BY MOUTH TWICE A DAY, Disp: 180 tablet, Rfl: 3   hydrALAZINE (APRESOLINE) 50 MG tablet, Take 1 tablet (50  mg total) by mouth 2 (two) times daily., Disp: 60 tablet, Rfl: 3   letrozole (FEMARA) 2.5 MG tablet, TAKE 1 TABLET BY MOUTH EVERY DAY, Disp: 90 tablet, Rfl: 4   levothyroxine (SYNTHROID) 75 MCG tablet, TAKE 1 TABLET BY MOUTH EVERY DAY (Patient taking differently: Take 75 mcg by mouth daily before breakfast.), Disp: 90 tablet, Rfl: 3   mirtazapine (REMERON) 7.5 MG tablet, TAKE 1 TABLET BY MOUTH EVERYDAY AT BEDTIME, Disp: 90 tablet, Rfl: 3   Multiple Vitamins-Minerals (MULTIVITAMIN WOMEN 50+) TABS, Take 1 tablet by mouth at bedtime., Disp: , Rfl:    pantoprazole (PROTONIX) 40 MG tablet, TAKE 1 TABLET (40 MG TOTAL) BY MOUTH TWICE A DAY BEFORE MEALS (Patient taking differently: Take 80 mg by mouth daily.), Disp: 180 tablet, Rfl: 1   torsemide  (DEMADEX) 20 MG tablet, Take 2 tablets (40 mg total) by mouth daily., Disp: 60 tablet, Rfl: 1 Social History   Socioeconomic History   Marital status: Widowed    Spouse name: Not on file   Number of children: 2   Years of education: 35   Highest education level: Not on file  Occupational History   Occupation: Retired  Tobacco Use   Smoking status: Never   Smokeless tobacco: Never  Vaping Use   Vaping status: Never Used  Substance and Sexual Activity   Alcohol use: No   Drug use: No   Sexual activity: Not Currently  Other Topics Concern   Not on file  Social History Narrative   Lives alone, but her daughter stays there at night   Ambidextrous (writes with left hand).   No caffeine use.   Social Determinants of Health   Financial Resource Strain: Low Risk  (12/15/2022)   Overall Financial Resource Strain (CARDIA)    Difficulty of Paying Living Expenses: Not hard at all  Food Insecurity: No Food Insecurity (05/03/2023)   Hunger Vital Sign    Worried About Running Out of Food in the Last Year: Never true    Ran Out of Food in the Last Year: Never true  Transportation Needs: No Transportation Needs (05/03/2023)   PRAPARE - Administrator, Civil Service (Medical): No    Lack of Transportation (Non-Medical): No  Physical Activity: Inactive (12/15/2022)   Exercise Vital Sign    Days of Exercise per Week: 0 days    Minutes of Exercise per Session: 0 min  Stress: No Stress Concern Present (12/15/2022)   [name] [employer] - Occupational Stress Questionnaire    Feeling of Stress : Not at all  Social Connections: Moderately Isolated (12/15/2022)   Social Connection and Isolation Panel [NHANES]    Frequency of Communication with Friends and Family: More than three times a week    Frequency of Social Gatherings with Friends and Family: Three times a week    Attends Religious Services: More than 4 times per year    Active Member of Clubs or  Organizations: No    Attends Banker Meetings: Never    Marital Status: Widowed  Intimate Partner Violence: Not At Risk (05/03/2023)   Humiliation, Afraid, Rape, and Kick questionnaire    Fear of Current or Ex-Partner: No    Emotionally Abused: No    Physically Abused: No    Sexually Abused: No   Family History  Problem Relation Age of Onset   Thyroid disease Mother    Stroke Father    Hypertension Brother    Lung cancer Maternal Uncle  Cancer Maternal Grandmother        Liver   Lung cancer Maternal Uncle    Hypertension Daughter    Hypercholesterolemia Daughter    Hypercholesterolemia Daughter     Objective: Office vital signs reviewed. BP (!) 180/58   Pulse 80   Temp 98.8 F (37.1 C)   Ht 5\' 4"  (1.626 m)   Wt 106 lb (48.1 kg)   SpO2 93%   BMI 18.19 kg/m   Physical Examination:  General: Awake, alert, thin, chronically ill-appearing female, No acute distress HEENT: Sclera white.  Moist mucous membranes.  No gross rhinorrhea appreciated Cardio: regular rate and rhythm, S1S2 heard, blowing systolic murmur present Pulm: Globally decreased breath sounds.  Otherwise clear to auscultation bilaterally, no wheezes, rhonchi or rales; normal work of breathing on 2 L via nasal cannula Extremities: No edema  Assessment/ Plan: 89 y.o. female   Acute on chronic diastolic CHF (congestive heart failure) (HCC) - Plan: DG Chest 2 View  Hospital discharge follow-up - Plan: DG Chest 2 View  Acute respiratory failure with hypoxia (HCC) - Plan: DG Chest 2 View  Rhinorrhea - Plan: azelastine (ASTELIN) 0.1 % nasal spray  Appears to be euvolemic.  Dry weight is going to be around 105 pounds.  She has a very good understanding as to when she should take her extra torsemide.  Kidney function consistent with CKD 4.  Very close monitoring of renal function and fluid balance needed in this patient.  Her blood pressure was not at goal despite recheck.  However home blood  pressure 143/58.  She had losartan removed recently so I would like her to be closely followed up in the next week or so for blood pressure recheck and continue to monitor this closely at home.  She may need to take clonidine more frequently if blood pressures remain uncontrolled at home  Has an appointment with hematology tomorrow and will have additional labs drawn if needed at that time.  Breathing is baseline on 2 L via nasal cannula.  Lungs without sounds concerning for fluid.  Globally decreased breath sounds consistent with COPD however.  Struggle with a bit of rhinorrhea some going to add Astelin nasal spray in lieu of utilizing any oral antihistamines given multiple medications need right now for her heart and limitations of kidney   Erron Wengert Hulen Skains, DO [facility] [phone]

## 2023-05-20 ENCOUNTER — Inpatient Hospital Stay: Payer: PPO

## 2023-05-20 ENCOUNTER — Inpatient Hospital Stay: Payer: PPO | Attending: Hematology

## 2023-05-20 VITALS — BP 108/39 | HR 57 | Temp 98.4°F | Resp 20

## 2023-05-20 DIAGNOSIS — M8580 Other specified disorders of bone density and structure, unspecified site: Secondary | ICD-10-CM | POA: Insufficient documentation

## 2023-05-20 DIAGNOSIS — N179 Acute kidney failure, unspecified: Secondary | ICD-10-CM | POA: Diagnosis not present

## 2023-05-20 DIAGNOSIS — R918 Other nonspecific abnormal finding of lung field: Secondary | ICD-10-CM | POA: Diagnosis not present

## 2023-05-20 DIAGNOSIS — C50511 Malignant neoplasm of lower-outer quadrant of right female breast: Secondary | ICD-10-CM | POA: Insufficient documentation

## 2023-05-20 DIAGNOSIS — J189 Pneumonia, unspecified organism: Secondary | ICD-10-CM | POA: Diagnosis not present

## 2023-05-20 DIAGNOSIS — D72829 Elevated white blood cell count, unspecified: Secondary | ICD-10-CM | POA: Diagnosis not present

## 2023-05-20 DIAGNOSIS — N183 Chronic kidney disease, stage 3 unspecified: Secondary | ICD-10-CM | POA: Insufficient documentation

## 2023-05-20 DIAGNOSIS — A419 Sepsis, unspecified organism: Secondary | ICD-10-CM | POA: Diagnosis not present

## 2023-05-20 DIAGNOSIS — Z170 Estrogen receptor positive status [ER+]: Secondary | ICD-10-CM | POA: Insufficient documentation

## 2023-05-20 DIAGNOSIS — R058 Other specified cough: Secondary | ICD-10-CM | POA: Diagnosis not present

## 2023-05-20 DIAGNOSIS — R509 Fever, unspecified: Secondary | ICD-10-CM | POA: Diagnosis not present

## 2023-05-20 DIAGNOSIS — D649 Anemia, unspecified: Secondary | ICD-10-CM

## 2023-05-20 DIAGNOSIS — Z79811 Long term (current) use of aromatase inhibitors: Secondary | ICD-10-CM | POA: Insufficient documentation

## 2023-05-20 DIAGNOSIS — D631 Anemia in chronic kidney disease: Secondary | ICD-10-CM | POA: Insufficient documentation

## 2023-05-20 LAB — CBC
HCT: 31.9 % — ABNORMAL LOW (ref 36.0–46.0)
Hemoglobin: 10.1 g/dL — ABNORMAL LOW (ref 12.0–15.0)
MCH: 28 pg (ref 26.0–34.0)
MCHC: 31.7 g/dL (ref 30.0–36.0)
MCV: 88.4 fL (ref 80.0–100.0)
Platelets: 110 10*3/uL — ABNORMAL LOW (ref 150–400)
RBC: 3.61 MIL/uL — ABNORMAL LOW (ref 3.87–5.11)
RDW: 15.4 % (ref 11.5–15.5)
WBC: 15.5 10*3/uL — ABNORMAL HIGH (ref 4.0–10.5)
nRBC: 0 % (ref 0.0–0.2)

## 2023-05-20 LAB — SAMPLE TO BLOOD BANK

## 2023-05-20 MED ORDER — EPOETIN ALFA-EPBX 10000 UNIT/ML IJ SOLN
10000.0000 [IU] | Freq: Once | INTRAMUSCULAR | Status: AC
  Administered 2023-05-20: 10000 [IU] via SUBCUTANEOUS
  Filled 2023-05-20: qty 1

## 2023-05-21 ENCOUNTER — Inpatient Hospital Stay (HOSPITAL_COMMUNITY)
Admission: EM | Admit: 2023-05-21 | Discharge: 2023-05-27 | DRG: 871 | Disposition: A | Discharge status: Home / Self Care | Payer: PPO | Attending: Internal Medicine | Admitting: Internal Medicine

## 2023-05-21 ENCOUNTER — Other Ambulatory Visit: Payer: Self-pay

## 2023-05-21 ENCOUNTER — Emergency Department (HOSPITAL_COMMUNITY): Payer: PPO

## 2023-05-21 DIAGNOSIS — Z515 Encounter for palliative care: Secondary | ICD-10-CM | POA: Diagnosis not present

## 2023-05-21 DIAGNOSIS — E782 Mixed hyperlipidemia: Secondary | ICD-10-CM | POA: Diagnosis present

## 2023-05-21 DIAGNOSIS — I130 Hypertensive heart and chronic kidney disease with heart failure and stage 1 through stage 4 chronic kidney disease, or unspecified chronic kidney disease: Secondary | ICD-10-CM | POA: Diagnosis present

## 2023-05-21 DIAGNOSIS — I5033 Acute on chronic diastolic (congestive) heart failure: Secondary | ICD-10-CM | POA: Diagnosis not present

## 2023-05-21 DIAGNOSIS — Z853 Personal history of malignant neoplasm of breast: Secondary | ICD-10-CM | POA: Diagnosis not present

## 2023-05-21 DIAGNOSIS — N179 Acute kidney failure, unspecified: Secondary | ICD-10-CM | POA: Diagnosis not present

## 2023-05-21 DIAGNOSIS — E441 Mild protein-calorie malnutrition: Secondary | ICD-10-CM | POA: Diagnosis not present

## 2023-05-21 DIAGNOSIS — J45909 Unspecified asthma, uncomplicated: Secondary | ICD-10-CM | POA: Diagnosis not present

## 2023-05-21 DIAGNOSIS — I517 Cardiomegaly: Secondary | ICD-10-CM | POA: Diagnosis not present

## 2023-05-21 DIAGNOSIS — Z8249 Family history of ischemic heart disease and other diseases of the circulatory system: Secondary | ICD-10-CM

## 2023-05-21 DIAGNOSIS — E46 Unspecified protein-calorie malnutrition: Secondary | ICD-10-CM | POA: Insufficient documentation

## 2023-05-21 DIAGNOSIS — R058 Other specified cough: Secondary | ICD-10-CM | POA: Diagnosis not present

## 2023-05-21 DIAGNOSIS — Z1152 Encounter for screening for COVID-19: Secondary | ICD-10-CM

## 2023-05-21 DIAGNOSIS — J9622 Acute and chronic respiratory failure with hypercapnia: Secondary | ICD-10-CM | POA: Diagnosis not present

## 2023-05-21 DIAGNOSIS — N189 Chronic kidney disease, unspecified: Secondary | ICD-10-CM | POA: Diagnosis present

## 2023-05-21 DIAGNOSIS — N184 Chronic kidney disease, stage 4 (severe): Secondary | ICD-10-CM | POA: Diagnosis present

## 2023-05-21 DIAGNOSIS — E039 Hypothyroidism, unspecified: Secondary | ICD-10-CM | POA: Diagnosis present

## 2023-05-21 DIAGNOSIS — I350 Nonrheumatic aortic (valve) stenosis: Secondary | ICD-10-CM | POA: Diagnosis not present

## 2023-05-21 DIAGNOSIS — Z8349 Family history of other endocrine, nutritional and metabolic diseases: Secondary | ICD-10-CM

## 2023-05-21 DIAGNOSIS — E440 Moderate protein-calorie malnutrition: Secondary | ICD-10-CM | POA: Diagnosis not present

## 2023-05-21 DIAGNOSIS — Z66 Do not resuscitate: Secondary | ICD-10-CM | POA: Diagnosis not present

## 2023-05-21 DIAGNOSIS — I10 Essential (primary) hypertension: Secondary | ICD-10-CM | POA: Diagnosis present

## 2023-05-21 DIAGNOSIS — J9621 Acute and chronic respiratory failure with hypoxia: Secondary | ICD-10-CM | POA: Diagnosis not present

## 2023-05-21 DIAGNOSIS — I4719 Other supraventricular tachycardia: Secondary | ICD-10-CM | POA: Diagnosis not present

## 2023-05-21 DIAGNOSIS — Z7189 Other specified counseling: Secondary | ICD-10-CM | POA: Diagnosis not present

## 2023-05-21 DIAGNOSIS — Z681 Body mass index (BMI) 19 or less, adult: Secondary | ICD-10-CM | POA: Diagnosis not present

## 2023-05-21 DIAGNOSIS — D72829 Elevated white blood cell count, unspecified: Secondary | ICD-10-CM | POA: Diagnosis not present

## 2023-05-21 DIAGNOSIS — R627 Adult failure to thrive: Secondary | ICD-10-CM | POA: Diagnosis not present

## 2023-05-21 DIAGNOSIS — R918 Other nonspecific abnormal finding of lung field: Secondary | ICD-10-CM | POA: Diagnosis not present

## 2023-05-21 DIAGNOSIS — I480 Paroxysmal atrial fibrillation: Secondary | ICD-10-CM | POA: Diagnosis present

## 2023-05-21 DIAGNOSIS — K219 Gastro-esophageal reflux disease without esophagitis: Secondary | ICD-10-CM | POA: Diagnosis present

## 2023-05-21 DIAGNOSIS — N1832 Chronic kidney disease, stage 3b: Secondary | ICD-10-CM | POA: Diagnosis not present

## 2023-05-21 DIAGNOSIS — D638 Anemia in other chronic diseases classified elsewhere: Secondary | ICD-10-CM | POA: Diagnosis present

## 2023-05-21 DIAGNOSIS — E8809 Other disorders of plasma-protein metabolism, not elsewhere classified: Secondary | ICD-10-CM | POA: Diagnosis not present

## 2023-05-21 DIAGNOSIS — I700 Atherosclerosis of aorta: Secondary | ICD-10-CM | POA: Diagnosis not present

## 2023-05-21 DIAGNOSIS — Z79890 Hormone replacement therapy (postmenopausal): Secondary | ICD-10-CM

## 2023-05-21 DIAGNOSIS — Z79899 Other long term (current) drug therapy: Secondary | ICD-10-CM

## 2023-05-21 DIAGNOSIS — Z85828 Personal history of other malignant neoplasm of skin: Secondary | ICD-10-CM

## 2023-05-21 DIAGNOSIS — Z801 Family history of malignant neoplasm of trachea, bronchus and lung: Secondary | ICD-10-CM

## 2023-05-21 DIAGNOSIS — E873 Alkalosis: Secondary | ICD-10-CM | POA: Diagnosis not present

## 2023-05-21 DIAGNOSIS — D696 Thrombocytopenia, unspecified: Secondary | ICD-10-CM

## 2023-05-21 DIAGNOSIS — Z888 Allergy status to other drugs, medicaments and biological substances status: Secondary | ICD-10-CM

## 2023-05-21 DIAGNOSIS — R509 Fever, unspecified: Secondary | ICD-10-CM | POA: Diagnosis not present

## 2023-05-21 DIAGNOSIS — J811 Chronic pulmonary edema: Secondary | ICD-10-CM | POA: Diagnosis not present

## 2023-05-21 DIAGNOSIS — D631 Anemia in chronic kidney disease: Secondary | ICD-10-CM | POA: Diagnosis present

## 2023-05-21 DIAGNOSIS — Z823 Family history of stroke: Secondary | ICD-10-CM

## 2023-05-21 DIAGNOSIS — I701 Atherosclerosis of renal artery: Secondary | ICD-10-CM | POA: Diagnosis not present

## 2023-05-21 DIAGNOSIS — Z882 Allergy status to sulfonamides status: Secondary | ICD-10-CM

## 2023-05-21 DIAGNOSIS — J189 Pneumonia, unspecified organism: Secondary | ICD-10-CM | POA: Diagnosis not present

## 2023-05-21 DIAGNOSIS — A419 Sepsis, unspecified organism: Secondary | ICD-10-CM | POA: Diagnosis not present

## 2023-05-21 DIAGNOSIS — R54 Age-related physical debility: Secondary | ICD-10-CM | POA: Diagnosis present

## 2023-05-21 DIAGNOSIS — J90 Pleural effusion, not elsewhere classified: Secondary | ICD-10-CM | POA: Diagnosis not present

## 2023-05-21 DIAGNOSIS — Z91048 Other nonmedicinal substance allergy status: Secondary | ICD-10-CM

## 2023-05-21 DIAGNOSIS — D61818 Other pancytopenia: Secondary | ICD-10-CM | POA: Diagnosis not present

## 2023-05-21 LAB — COMPREHENSIVE METABOLIC PANEL
ALT: 14 U/L (ref 0–44)
AST: 22 U/L (ref 15–41)
Albumin: 3.3 g/dL — ABNORMAL LOW (ref 3.5–5.0)
Alkaline Phosphatase: 47 U/L (ref 38–126)
Anion gap: 12 (ref 5–15)
BUN: 62 mg/dL — ABNORMAL HIGH (ref 8–23)
CO2: 32 mmol/L (ref 22–32)
Calcium: 8.4 mg/dL — ABNORMAL LOW (ref 8.9–10.3)
Chloride: 87 mmol/L — ABNORMAL LOW (ref 98–111)
Creatinine, Ser: 2.17 mg/dL — ABNORMAL HIGH (ref 0.44–1.00)
GFR, Estimated: 22 mL/min — ABNORMAL LOW (ref >60)
Glucose, Bld: 139 mg/dL — ABNORMAL HIGH (ref 70–99)
Potassium: 3.9 mmol/L (ref 3.5–5.1)
Sodium: 131 mmol/L — ABNORMAL LOW (ref 135–145)
Total Bilirubin: 1.2 mg/dL (ref 0.3–1.2)
Total Protein: 7.1 g/dL (ref 6.5–8.1)

## 2023-05-21 LAB — CBC WITH DIFFERENTIAL/PLATELET
Abs Immature Granulocytes: 0.15 10*3/uL — ABNORMAL HIGH (ref 0.00–0.07)
Basophils Absolute: 0 10*3/uL (ref 0.0–0.1)
Basophils Relative: 0 %
Eosinophils Absolute: 0.1 10*3/uL (ref 0.0–0.5)
Eosinophils Relative: 0 %
HCT: 29.9 % — ABNORMAL LOW (ref 36.0–46.0)
Hemoglobin: 9.2 g/dL — ABNORMAL LOW (ref 12.0–15.0)
Immature Granulocytes: 1 %
Lymphocytes Relative: 2 %
Lymphs Abs: 0.4 10*3/uL — ABNORMAL LOW (ref 0.7–4.0)
MCH: 27.1 pg (ref 26.0–34.0)
MCHC: 30.8 g/dL (ref 30.0–36.0)
MCV: 88.2 fL (ref 80.0–100.0)
Monocytes Absolute: 1 10*3/uL (ref 0.1–1.0)
Monocytes Relative: 6 %
Neutro Abs: 14.6 10*3/uL — ABNORMAL HIGH (ref 1.7–7.7)
Neutrophils Relative %: 91 %
Platelets: 89 10*3/uL — ABNORMAL LOW (ref 150–400)
RBC: 3.39 MIL/uL — ABNORMAL LOW (ref 3.87–5.11)
RDW: 15.7 % — ABNORMAL HIGH (ref 11.5–15.5)
WBC Morphology: ABNORMAL
WBC: 16.2 10*3/uL — ABNORMAL HIGH (ref 4.0–10.5)
nRBC: 0 % (ref 0.0–0.2)

## 2023-05-21 LAB — RESP PANEL BY RT-PCR (RSV, FLU A&B, COVID)  RVPGX2
Influenza A by PCR: NEGATIVE
Influenza B by PCR: NEGATIVE
Resp Syncytial Virus by PCR: NEGATIVE
SARS Coronavirus 2 by RT PCR: NEGATIVE

## 2023-05-21 LAB — LACTIC ACID, PLASMA: Lactic Acid, Venous: 0.6 mmol/L (ref 0.5–1.9)

## 2023-05-21 LAB — TROPONIN I (HIGH SENSITIVITY): Troponin I (High Sensitivity): 15 ng/L (ref <18)

## 2023-05-21 MED ORDER — SODIUM CHLORIDE 0.9 % IV SOLN
500.0000 mg | INTRAVENOUS | Status: DC
  Administered 2023-05-21 – 2023-05-23 (×3): 500 mg via INTRAVENOUS
  Filled 2023-05-21 (×3): qty 5

## 2023-05-21 MED ORDER — MORPHINE SULFATE (PF) 2 MG/ML IV SOLN
2.0000 mg | Freq: Once | INTRAVENOUS | Status: AC
  Administered 2023-05-21: 2 mg via INTRAVENOUS
  Filled 2023-05-21: qty 1

## 2023-05-21 MED ORDER — IPRATROPIUM-ALBUTEROL 0.5-2.5 (3) MG/3ML IN SOLN
3.0000 mL | Freq: Once | RESPIRATORY_TRACT | Status: AC
  Administered 2023-05-21: 3 mL via RESPIRATORY_TRACT
  Filled 2023-05-21: qty 3

## 2023-05-21 MED ORDER — SODIUM CHLORIDE 0.9 % IV SOLN
1.0000 g | Freq: Once | INTRAVENOUS | Status: AC
  Administered 2023-05-21: 1 g via INTRAVENOUS
  Filled 2023-05-21: qty 10

## 2023-05-21 MED ORDER — ONDANSETRON HCL 4 MG/2ML IJ SOLN
4.0000 mg | Freq: Once | INTRAMUSCULAR | Status: AC
  Administered 2023-05-21: 4 mg via INTRAVENOUS
  Filled 2023-05-21: qty 2

## 2023-05-21 NOTE — ED Notes (Signed)
Took pt to bathroom via wheelchair with 2L O2 in place; when pt got back in bed, O2 was at 85%; pts O2 eventually came up to 92%; RN notified

## 2023-05-21 NOTE — ED Notes (Signed)
Notified respiratory of order for neb treatment.

## 2023-05-21 NOTE — ED Provider Notes (Signed)
[facility] EMERGENCY DEPARTMENT AT [facility] Provider Note   CSN: [id number] Arrival date & time: 05/21/23  1942     History  Chief Complaint  Patient presents with   Fever    [name] is a 89 y.o. female.  Pt is a 89 yo female with pmhx significant for hld, htn, hypothyroidism, renal artery stenosis, skin cancer, ckd, and chf.  Pt wears 2L oxygen all the time.  She has had cough with sob and fever for 2 days.  She had some blood streaks in her sputum today.  Pt said it hurts to take a deep breath.       Home Medications Prior to Admission medications   Medication Sig Start Date End Date Taking? Authorizing Provider  atorvastatin (LIPITOR) 20 MG tablet TAKE 1/2 TABLET BY MOUTH DAILY Patient taking differently: Take 10 mg by mouth at bedtime. 03/19/23  Yes Gottschalk, Kathie Rhodes M, DO  azelastine (ASTELIN) 0.1 % nasal spray Place 1 spray into both nostrils 2 (two) times daily. For runny nose 05/19/23  Yes Gottschalk, Ashly M, DO  bisoprolol (ZEBETA) 5 MG tablet TAKE 1 TABLET (5 MG TOTAL) BY MOUTH DAILY. 08/08/22  Yes Delynn Flavin M, DO  Cholecalciferol (VITAMIN D-3 PO) Take 1 tablet by mouth at bedtime.   Yes [provider]  Cyanocobalamin (VITAMIN B-12 PO) Take 1 tablet by mouth at bedtime.   Yes [provider]  feeding supplement (ENSURE ENLIVE / ENSURE PLUS) LIQD Take 237 mLs by mouth 2 (two) times daily between meals. Patient taking differently: Take 237 mLs by mouth daily as needed (decreased appetite). 08/28/22  Yes Tat, Onalee Hua, MD  ferrous sulfate 325 (65 FE) MG tablet Take 325 mg by mouth daily with breakfast.   Yes [provider]  flecainide (TAMBOCOR) 50 MG tablet TAKE 1 TABLET BY MOUTH TWICE A DAY 09/26/22  Yes Rollene Rotunda, MD  hydrALAZINE (APRESOLINE) 50 MG tablet Take 1 tablet (50 mg total) by mouth 2 (two) times daily. 04/04/23  Yes Lewie Chamber, MD  letrozole [facility]) 2.5 MG tablet TAKE 1 TABLET BY MOUTH EVERY DAY 01/22/23   Yes Doreatha Massed, MD  levothyroxine (SYNTHROID) 75 MCG tablet TAKE 1 TABLET BY MOUTH EVERY DAY Patient taking differently: Take 75 mcg by mouth daily before breakfast. 01/09/23  Yes Gottschalk, Ashly M, DO  mirtazapine (REMERON) 7.5 MG tablet TAKE 1 TABLET BY MOUTH EVERYDAY AT BEDTIME 02/27/23  Yes Harper, Kristen S, PA-C  Multiple Vitamins-Minerals (MULTIVITAMIN WOMEN 50+) TABS Take 1 tablet by mouth at bedtime.   Yes [provider]  pantoprazole (PROTONIX) 40 MG tablet TAKE 1 TABLET (40 MG TOTAL) BY MOUTH TWICE A DAY BEFORE MEALS Patient taking differently: Take 80 mg by mouth daily. 01/16/23  Yes Letta Median, PA-C  torsemide (DEMADEX) 20 MG tablet Take 2 tablets (40 mg total) by mouth daily. 05/09/23  Yes Tat, Onalee Hua, MD      Allergies    Norvasc [amlodipine], Tape, Zestril [lisinopril], Hytrin [terazosin], Imdur [isosorbide nitrate], and Sulfonamide derivatives    Review of Systems   Review of Systems  Constitutional:  Positive for fever.  Respiratory:  Positive for cough and shortness of breath.   All other systems reviewed and are negative.   Physical Exam Updated Vital Signs BP (!) 109/37   Pulse 86   Temp 97.9 F (36.6 C) (Axillary)   Resp 12   Wt 48 kg   SpO2 93%   BMI 18.16 kg/m  Physical Exam  Vitals and nursing note reviewed.  Constitutional:      Appearance: Normal appearance.  HENT:     Head: Normocephalic and atraumatic.     Right Ear: External ear normal.     Left Ear: External ear normal.     Nose: Nose normal.     Mouth/Throat:     Mouth: Mucous membranes are moist.     Pharynx: Oropharynx is clear.  Eyes:     Extraocular Movements: Extraocular movements intact.     Conjunctiva/sclera: Conjunctivae normal.     Pupils: Pupils are equal, round, and reactive to light.  Cardiovascular:     Rate and Rhythm: Normal rate and regular rhythm.     Heart sounds: Murmur heard.  Pulmonary:     Effort: Tachypnea present.     Breath sounds:  Wheezing and rhonchi present.  Abdominal:     General: Abdomen is flat. Bowel sounds are normal.     Palpations: Abdomen is soft.  Musculoskeletal:        General: Normal range of motion.     Cervical back: Normal range of motion and neck supple.  Skin:    General: Skin is warm.     Capillary Refill: Capillary refill takes less than 2 seconds.  Neurological:     General: No focal deficit present.     Mental Status: She is alert and oriented to person, place, and time.  Psychiatric:        Mood and Affect: Mood normal.        Behavior: Behavior normal.     ED Results / Procedures / Treatments   Labs (all labs ordered are listed, but only abnormal results are displayed) Labs Reviewed  COMPREHENSIVE METABOLIC PANEL - Abnormal; Notable for the following components:      Result Value   Sodium 131 (*)    Chloride 87 (*)    Glucose, Bld 139 (*)    BUN 62 (*)    Creatinine, Ser 2.17 (*)    Calcium 8.4 (*)    Albumin 3.3 (*)    GFR, Estimated 22 (*)    All other components within normal limits  CBC WITH DIFFERENTIAL/PLATELET - Abnormal; Notable for the following components:   WBC 16.2 (*)    RBC 3.39 (*)    Hemoglobin 9.2 (*)    HCT 29.9 (*)    RDW 15.7 (*)    Platelets 89 (*)    Neutro Abs 14.6 (*)    Lymphs Abs 0.4 (*)    Abs Immature Granulocytes 0.15 (*)    All other components within normal limits  RESP PANEL BY RT-PCR (RSV, FLU A&B, COVID)  [id number]  CULTURE, BLOOD (ROUTINE X 2)  CULTURE, BLOOD (ROUTINE X 2)  LACTIC ACID, PLASMA  TROPONIN I (HIGH SENSITIVITY)  TROPONIN I (HIGH SENSITIVITY)    EKG None  Radiology DG Chest 2 View  Result Date: 05/21/2023 CLINICAL DATA:  Fever and productive cough and weakness for 2 days. Elevated white cell count yesterday. EXAM: CHEST - 2 VIEW COMPARISON:  05/05/2023 FINDINGS: Cardiac enlargement. Patchy infiltrates in the lungs bilaterally, progressing since prior study, suggesting multifocal pneumonia. Small left pleural  effusion. No pneumothorax. Mediastinal contours appear intact. IMPRESSION: Developing patchy pulmonary infiltrates bilaterally, greater on the left, likely pneumonia. Cardiac enlargement. Electronically Signed   By: Burman Nieves M.D.   On: 05/21/2023 21:05    Procedures Procedures    Medications Ordered in ED Medications  azithromycin (ZITHROMAX) 500 mg in sodium chloride  0.9 % 250 mL IVPB (0 mg Intravenous Stopped 05/21/23 2350)  cefTRIAXone (ROCEPHIN) 1 g in sodium chloride 0.9 % 100 mL IVPB (0 g Intravenous Stopped 05/21/23 2238)  ipratropium-albuterol (DUONEB) 0.5-2.5 (3) MG/3ML nebulizer solution 3 mL (3 mLs Nebulization Given 05/21/23 2307)  morphine (PF) 2 MG/ML injection 2 mg (2 mg Intravenous Given 05/21/23 2247)  ondansetron (ZOFRAN) injection 4 mg (4 mg Intravenous Given 05/21/23 2245)    ED Course/ Medical Decision Making/ A&P                                 Medical Decision Making Amount and/or Complexity of Data Reviewed Labs: ordered. Radiology: ordered.  Risk Prescription drug management. Decision regarding hospitalization.   This patient presents to the ED for concern of sob and cough, this involves an extensive number of treatment options, and is a complaint that carries with it a high risk of complications and morbidity.  The differential diagnosis includes covid/flu/rsv, pna, chf   Co morbidities that complicate the patient evaluation  hld, htn, hypothyroidism, renal artery stenosis, skin cancer, ckd, and chf   Additional history obtained:  Additional history obtained from epic chart review External records from outside source obtained and reviewed including family   Lab Tests:  I Ordered, and personally interpreted labs.  The pertinent results include:  cbc with wbc elevated at 16.2, hgb low at 9.2 (chronic), cmp with bun elevated at 62 and cr elevated at 2.17 (cr 1.84 on 9/30), trop nl, covid/flu/rsv neg   Imaging Studies ordered:  I ordered  imaging studies including cxr  I independently visualized and interpreted imaging which showed  Developing patchy pulmonary infiltrates bilaterally, greater on the  left, likely pneumonia. Cardiac enlargement.   I agree with the radiologist interpretation   Cardiac Monitoring:  The patient was maintained on a cardiac monitor.  I personally viewed and interpreted the cardiac monitored which showed an underlying rhythm of: nsr   Medicines ordered and prescription drug management:  I ordered medication including rocephin/zithromax  for pna  Reevaluation of the patient after these medicines showed that the patient improved I have reviewed the patients home medicines and have made adjustments as needed   Test Considered:  ct   Critical Interventions:  abx   Consultations Obtained:  I requested consultation with the hospitalist (Dr. Thomes Dinning),  and discussed lab and imaging findings as well as pertinent plan - he will admit   Problem List / ED Course:  CAP:  pt quite symptomatic from the pna.  O2 sats dropped to 85% on 2L moving from wheelchair to bed.  O2 went back up to the low 90s after rest.  Pt given rocephin/zithromax. Duo neb ordered.  Morphine ordered for pain.   Reevaluation:  After the interventions noted above, I reevaluated the patient and found that they have :improved   Social Determinants of Health:  Lives at home   Dispostion:  After consideration of the diagnostic results and the patients response to treatment, I feel that the patent would benefit from admission.          Final Clinical Impression(s) / ED Diagnoses Final diagnoses:  Community acquired pneumonia, unspecified laterality  AKI (acute kidney injury) (HCC)    Rx / DC Orders ED Discharge Orders     None         Jacalyn Lefevre, MD 05/21/23 2356

## 2023-05-21 NOTE — H&P (Incomplete)
History and Physical    Patient: [name]:[id number] DOB: 07/25/1934 DOA: 05/21/2023 DOS: the patient was seen and examined on 05/21/2023 PCP: [name], DO  Patient coming from: Home  Chief Complaint:  Chief Complaint  Patient presents with  . Fever   HPI: [name] is a 89 y.o. female with medical history significant of hypertension, hyperlipidemia, atrial fibrillation, moderate to severe aortic stenosis, CKD stage IIIb, hypothyroidism, chronic respiratory failure on 2 LPM of oxygen 24/7 who presents to the emergency department due to 2-day onset of increasing shortness of breath and subjective fever, she complained of  ED Course:  In the emergency department, BP was 142/50, other vital signs were within normal range.  O2 sat was 92% on 2 LPM of oxygen.  Workup in the ED showed leukocytosis, normocytic anemia, thrombocytopenia, lactic acid was normal, troponin x 1 was influenza A, B, SARS coronavirus 2, RSV was negative.,  Blood culture pending. Chest x-ray showed developing patchy pulmonary infiltrates bilaterally greater on the left, likely pneumonia.  Cardiac enlargement. Patient was started on IV ceftriaxone and azithromycin, breathing treatment was provided, morphine was given due to pain and Zofran was provided. Hospitalist was asked to admit patient for further evaluation and management.  Review of Systems: Review of systems as noted in the HPI. All other systems reviewed and are negative.   Past Medical History:  Diagnosis Date  . Anemia   . Arthritis   . Asthma   . Atrial fibrillation (HCC)   . Basal cell carcinoma 02/06/2009   Left ear targus- (MO[facility])  . Basal cell carcinoma 09/28/2008   Right back-(CX35FU)  . CKD (chronic kidney disease), stage III (HCC)   . Heart murmur   . Hyperlipidemia   . Hypertension   . Hypothyroidism   . Nodule of right lung    Right upper lobe  . Osteopenia due to cancer therapy 10/07/2022  . Pneumonia   . PONV  (postoperative nausea and vomiting)   . Renal insufficiency    Chronic  . Renal vascular disease   . Right renal artery stenosis (HCC) 05/09/2015  . Squamous cell carcinoma of skin 09/07/2019   KA-Left shin-txpbx  . Vertigo    Past Surgical History:  Procedure Laterality Date  . BIOPSY  04/09/2021   Procedure: BIOPSY;  Surgeon: Dolores Frame, MD;  [facility];  Service: Gastroenterology;;  . BREAST LUMPECTOMY WITH RADIOACTIVE SEED LOCALIZATION Right 04/08/2018   Procedure: BREAST LUMPECTOMY WITH RADIOACTIVE SEED LOCALIZATION;  Surgeon: Emelia Loron, MD;  Location: [facility] OR;  Service: General;  Laterality: Right;  . COLONOSCOPY  2016   diverticulosis, hemorrhoids  . ESOPHAGOGASTRODUODENOSCOPY  2016   normal  . ESOPHAGOGASTRODUODENOSCOPY (EGD) WITH PROPOFOL N/A 04/09/2021   Procedure: ESOPHAGOGASTRODUODENOSCOPY (EGD) WITH PROPOFOL;  Surgeon: Dolores Frame, MD;  [facility];  Service: Gastroenterology;  Laterality: N/A;  12:45  . ESOPHAGOGASTRODUODENOSCOPY (EGD) WITH PROPOFOL N/A 09/01/2021   Procedure: ESOPHAGOGASTRODUODENOSCOPY (EGD) WITH PROPOFOL;  Surgeon: Lanelle Bal, DO;  [facility];  Service: Endoscopy;  Laterality: N/A;  . IR GENERIC HISTORICAL  08/29/2016   IR US GUIDE VASC ACCESS RIGHT 08/29/2016 [facility]  . IR GENERIC HISTORICAL  08/29/2016   IR RENAL BILAT S&I MOD SED 08/29/2016 [facility]  . KNEE ARTHROSCOPY Right    2019  . THYROIDECTOMY, PARTIAL Right 1981   middle lobe removed 1st, then right lobe removed 7-8 years later (approx. 76)    Social History:  reports that she has never smoked. She has never used smokeless tobacco. She reports that she does not drink alcohol and does not use drugs.   Allergies  Allergen Reactions  . Norvasc [Amlodipine] Swelling    Edema   . Tape Other (See Comments)    Has to have no stick tape.  Marland Kitchen Zestril [Lisinopril] Cough  . Hytrin [Terazosin] Hives and  Nausea Only  . Imdur [Isosorbide Nitrate] Nausea Only  . Sulfonamide Derivatives Nausea And Vomiting and Rash    Family History  Problem Relation Age of Onset  . Thyroid disease Mother   . Stroke Father   . Hypertension Brother   . Lung cancer Maternal Uncle   . Cancer Maternal Grandmother        Liver  . Lung cancer Maternal Uncle   . Hypertension Daughter   . Hypercholesterolemia Daughter   . Hypercholesterolemia Daughter     ***  Prior to Admission medications   Medication Sig Start Date End Date Taking? Authorizing Provider  atorvastatin (LIPITOR) 20 MG tablet TAKE 1/2 TABLET BY MOUTH DAILY Patient taking differently: Take 10 mg by mouth at bedtime. 03/19/23  Yes Gottschalk, Kathie Rhodes M, DO  azelastine (ASTELIN) 0.1 % nasal spray Place 1 spray into both nostrils 2 (two) times daily. For runny nose 05/19/23  Yes Gottschalk, Ashly M, DO  bisoprolol (ZEBETA) 5 MG tablet TAKE 1 TABLET (5 MG TOTAL) BY MOUTH DAILY. 08/08/22  Yes Delynn Flavin M, DO  Cholecalciferol (VITAMIN D-3 PO) Take 1 tablet by mouth at bedtime.   Yes [provider]  Cyanocobalamin (VITAMIN B-12 PO) Take 1 tablet by mouth at bedtime.   Yes [provider]  feeding supplement (ENSURE ENLIVE / ENSURE PLUS) LIQD Take 237 mLs by mouth 2 (two) times daily between meals. Patient taking differently: Take 237 mLs by mouth daily as needed (decreased appetite). 08/28/22  Yes Tat, Onalee Hua, MD  ferrous sulfate 325 (65 FE) MG tablet Take 325 mg by mouth daily with breakfast.   Yes [provider]  flecainide (TAMBOCOR) 50 MG tablet TAKE 1 TABLET BY MOUTH TWICE A DAY 09/26/22  Yes Rollene Rotunda, MD  hydrALAZINE (APRESOLINE) 50 MG tablet Take 1 tablet (50 mg total) by mouth 2 (two) times daily. 04/04/23  Yes Lewie Chamber, MD  letrozole [facility]) 2.5 MG tablet TAKE 1 TABLET BY MOUTH EVERY DAY 01/22/23  Yes Doreatha Massed, MD  levothyroxine (SYNTHROID) 75 MCG tablet TAKE 1 TABLET BY MOUTH EVERY  DAY Patient taking differently: Take 75 mcg by mouth daily before breakfast. 01/09/23  Yes Gottschalk, Ashly M, DO  mirtazapine (REMERON) 7.5 MG tablet TAKE 1 TABLET BY MOUTH EVERYDAY AT BEDTIME 02/27/23  Yes Harper, Kristen S, PA-C  Multiple Vitamins-Minerals (MULTIVITAMIN WOMEN 50+) TABS Take 1 tablet by mouth at bedtime.   Yes [provider]  pantoprazole (PROTONIX) 40 MG tablet TAKE 1 TABLET (40 MG TOTAL) BY MOUTH TWICE A DAY BEFORE MEALS Patient taking differently: Take 80 mg by mouth daily. 01/16/23  Yes Letta Median, PA-C  torsemide (DEMADEX) 20 MG tablet Take 2 tablets (40 mg total) by mouth daily. 05/09/23  Yes TatOnalee Hua, MD    Physical Exam: BP (!) 109/37   Pulse 86   Temp 97.9 F (36.6 C) (Axillary)   Resp 12   Wt 48 kg   SpO2 93%   BMI 18.16 kg/m   General: 89 y.o. year-old female well developed well nourished in no acute distress.  Alert and oriented x3.  HEENT: NCAT, EOMI Neck: Supple, trachea medial Cardiovascular: Regular rate and rhythm with no rubs or gallops.  No thyromegaly or JVD noted.  No lower extremity edema. 2/4 pulses in all 4 extremities. Respiratory: Clear to auscultation with no wheezes or rales. Good inspiratory effort. Abdomen: Soft, nontender nondistended with normal bowel sounds x4 quadrants. Muskuloskeletal: No cyanosis, clubbing or edema noted bilaterally Neuro: CN II-XII intact, strength 5/5 x 4, sensation, reflexes intact Skin: No ulcerative lesions noted or rashes Psychiatry: Judgement and insight appear normal. Mood is appropriate for condition and setting          Labs on Admission:  Basic Metabolic Panel: Recent Labs  Lab 05/18/23 0930 05/18/23 0933 05/21/23 2129  NA 141 141 131*  K 4.2 4.0 3.9  CL 91* 92* 87*  CO2 35* 36* 32  GLUCOSE 97 97 139*  BUN 43* 42* 62*  CREATININE 1.85* 1.84* 2.17*  CALCIUM 9.2 9.0 8.4*  PHOS 4.0  --   --    Liver Function Tests: Recent Labs  Lab 05/18/23 0930 05/21/23 2129  AST  --   22  ALT  --  14  ALKPHOS  --  47  BILITOT  --  1.2  PROT  --  7.1  ALBUMIN 4.2 3.3*   No results for input(s): "LIPASE", "AMYLASE" in the last 168 hours. No results for input(s): "AMMONIA" in the last 168 hours. CBC: Recent Labs  Lab 05/20/23 1303 05/21/23 2129  WBC 15.5* 16.2*  NEUTROABS  --  14.6*  HGB 10.1* 9.2*  HCT 31.9* 29.9*  MCV 88.4 88.2  PLT 110* 89*   Cardiac Enzymes: No results for input(s): "CKTOTAL", "CKMB", "CKMBINDEX", "TROPONINI" in the last 168 hours.  BNP (last 3 results) Recent Labs    05/03/23 1302 05/04/23 0455 05/05/23 0536  BNP 654.0* 472.0* 233.0*    ProBNP (last 3 results) No results for input(s): "PROBNP" in the last 8760 hours.  CBG: No results for input(s): "GLUCAP" in the last 168 hours.  Radiological Exams on Admission: DG Chest 2 View  Result Date: 05/21/2023 CLINICAL DATA:  Fever and productive cough and weakness for 2 days. Elevated white cell count yesterday. EXAM: CHEST - 2 VIEW COMPARISON:  05/05/2023 FINDINGS: Cardiac enlargement. Patchy infiltrates in the lungs bilaterally, progressing since prior study, suggesting multifocal pneumonia. Small left pleural effusion. No pneumothorax. Mediastinal contours appear intact. IMPRESSION: Developing patchy pulmonary infiltrates bilaterally, greater on the left, likely pneumonia. Cardiac enlargement. Electronically Signed   By: Burman Nieves M.D.   On: 05/21/2023 21:05    EKG: I independently viewed the EKG done and my findings are as followed: ***   Assessment/Plan Present on Admission: . CAP (community acquired pneumonia)  Principal Problem:   CAP (community acquired pneumonia)   DVT prophylaxis: ***   Code Status: ***   Family Communication: ***   Disposition Plan: ***   Consults called: ***   Admission status: ***     Frankey Shown MD [facility] Pager [phone]  If 7PM-7AM, please contact night-coverage [email or web address] Password [facility]  05/21/2023,  11:47 PM      Review of Systems: {ROS_Text:26778} Past Medical History:  Diagnosis Date  . Anemia   . Arthritis   . Asthma   . Atrial fibrillation (HCC)   . Basal cell carcinoma 02/06/2009   Left ear targus- ([facility])  . Basal cell carcinoma 09/28/2008   Right back-(CX35FU)  . CKD (chronic kidney disease), stage III (HCC)   . Heart murmur   . Hyperlipidemia   .  Hypertension   . Hypothyroidism   . Nodule of right lung    Right upper lobe  . Osteopenia due to cancer therapy 10/07/2022  . Pneumonia   . PONV (postoperative nausea and vomiting)   . Renal insufficiency    Chronic  . Renal vascular disease   . Right renal artery stenosis (HCC) 05/09/2015  . Squamous cell carcinoma of skin 09/07/2019   KA-Left shin-txpbx  . Vertigo    Past Surgical History:  Procedure Laterality Date  . BIOPSY  04/09/2021   Procedure: BIOPSY;  Surgeon: Dolores Frame, MD;  [facility];  Service: Gastroenterology;;  . BREAST LUMPECTOMY WITH RADIOACTIVE SEED LOCALIZATION Right 04/08/2018   Procedure: BREAST LUMPECTOMY WITH RADIOACTIVE SEED LOCALIZATION;  Surgeon: Emelia Loron, MD;  Location: [facility] OR;  Service: General;  Laterality: Right;  . COLONOSCOPY  2016   diverticulosis, hemorrhoids  . ESOPHAGOGASTRODUODENOSCOPY  2016   normal  . ESOPHAGOGASTRODUODENOSCOPY (EGD) WITH PROPOFOL N/A 04/09/2021   Procedure: ESOPHAGOGASTRODUODENOSCOPY (EGD) WITH PROPOFOL;  Surgeon: Dolores Frame, MD;  [facility];  Service: Gastroenterology;  Laterality: N/A;  12:45  . ESOPHAGOGASTRODUODENOSCOPY (EGD) WITH PROPOFOL N/A 09/01/2021   Procedure: ESOPHAGOGASTRODUODENOSCOPY (EGD) WITH PROPOFOL;  Surgeon: Lanelle Bal, DO;  [facility];  Service: Endoscopy;  Laterality: N/A;  . IR GENERIC HISTORICAL  08/29/2016   IR US GUIDE VASC ACCESS RIGHT 08/29/2016 [facility]  . IR GENERIC HISTORICAL  08/29/2016   IR RENAL BILAT S&I MOD SED 08/29/2016 [facility]   . KNEE ARTHROSCOPY Right    2019  . THYROIDECTOMY, PARTIAL Right 1981   middle lobe removed 1st, then right lobe removed 7-8 years later (approx. 67)   Social History:  reports that she has never smoked. She has never used smokeless tobacco. She reports that she does not drink alcohol and does not use drugs.  Allergies  Allergen Reactions  . Norvasc [Amlodipine] Swelling    Edema   . Tape Other (See Comments)    Has to have no stick tape.  Marland Kitchen Zestril [Lisinopril] Cough  . Hytrin [Terazosin] Hives and Nausea Only  . Imdur [Isosorbide Nitrate] Nausea Only  . Sulfonamide Derivatives Nausea And Vomiting and Rash    Family History  Problem Relation Age of Onset  . Thyroid disease Mother   . Stroke Father   . Hypertension Brother   . Lung cancer Maternal Uncle   . Cancer Maternal Grandmother        Liver  . Lung cancer Maternal Uncle   . Hypertension Daughter   . Hypercholesterolemia Daughter   . Hypercholesterolemia Daughter     Prior to Admission medications   Medication Sig Start Date End Date Taking? Authorizing Provider  atorvastatin (LIPITOR) 20 MG tablet TAKE 1/2 TABLET BY MOUTH DAILY Patient taking differently: Take 10 mg by mouth at bedtime. 03/19/23  Yes Gottschalk, Kathie Rhodes M, DO  azelastine (ASTELIN) 0.1 % nasal spray Place 1 spray into both nostrils 2 (two) times daily. For runny nose 05/19/23  Yes Gottschalk, Ashly M, DO  bisoprolol (ZEBETA) 5 MG tablet TAKE 1 TABLET (5 MG TOTAL) BY MOUTH DAILY. 08/08/22  Yes Delynn Flavin M, DO  Cholecalciferol (VITAMIN D-3 PO) Take 1 tablet by mouth at bedtime.   Yes [provider]  Cyanocobalamin (VITAMIN B-12 PO) Take 1 tablet by mouth at bedtime.   Yes [provider]  feeding supplement (ENSURE ENLIVE / ENSURE PLUS) LIQD Take 237 mLs by mouth 2 (two) times daily  between meals. Patient taking differently: Take 237 mLs by mouth daily as needed (decreased appetite). 08/28/22  Yes Tat, Onalee Hua, MD  ferrous sulfate  325 (65 FE) MG tablet Take 325 mg by mouth daily with breakfast.   Yes [provider]  flecainide (TAMBOCOR) 50 MG tablet TAKE 1 TABLET BY MOUTH TWICE A DAY 09/26/22  Yes Rollene Rotunda, MD  hydrALAZINE (APRESOLINE) 50 MG tablet Take 1 tablet (50 mg total) by mouth 2 (two) times daily. 04/04/23  Yes Lewie Chamber, MD  letrozole [facility]) 2.5 MG tablet TAKE 1 TABLET BY MOUTH EVERY DAY 01/22/23  Yes Doreatha Massed, MD  levothyroxine (SYNTHROID) 75 MCG tablet TAKE 1 TABLET BY MOUTH EVERY DAY Patient taking differently: Take 75 mcg by mouth daily before breakfast. 01/09/23  Yes Gottschalk, Ashly M, DO  mirtazapine (REMERON) 7.5 MG tablet TAKE 1 TABLET BY MOUTH EVERYDAY AT BEDTIME 02/27/23  Yes Harper, Kristen S, PA-C  Multiple Vitamins-Minerals (MULTIVITAMIN WOMEN 50+) TABS Take 1 tablet by mouth at bedtime.   Yes [provider]  pantoprazole (PROTONIX) 40 MG tablet TAKE 1 TABLET (40 MG TOTAL) BY MOUTH TWICE A DAY BEFORE MEALS Patient taking differently: Take 80 mg by mouth daily. 01/16/23  Yes Letta Median, PA-C  torsemide (DEMADEX) 20 MG tablet Take 2 tablets (40 mg total) by mouth daily. 05/09/23  Beatrix Shipper, MD    Physical Exam: Vitals:   05/21/23 1954 05/21/23 2130 05/21/23 2215 05/21/23 2245  BP: (!) 142/50 (!) 150/69 (!) 152/52 (!) 133/52  Pulse: 77 72 77 78  Resp: 20  (!) 21 (!) 38  Temp: 99.5 F (37.5 C)     TempSrc: Oral     SpO2: 92% 98% 92% (!) 89%  Weight:       *** Data Reviewed: {Tip this will not be part of the note when signed- Document your independent interpretation of telemetry tracing, EKG, lab, Radiology test or any other diagnostic tests. Add any new diagnostic test ordered today. (Optional):26781} {Results:26384}  Assessment and Plan: No notes have been filed under this hospital service. Service: Hospitalist     Advance Care Planning:   Code Status: Prior ***  Consults: ***  Family Communication: ***  Severity of  Illness: {Observation/Inpatient:21159}  Author: Frankey Shown, DO 05/21/2023 11:08 PM  For on call review [email or web address].

## 2023-05-21 NOTE — ED Notes (Signed)
Pt to bathroom via wheelchair in NAD.

## 2023-05-21 NOTE — ED Notes (Signed)
Upon this RN's shift assessment patient c/o Right sided chest pain that worsens with deep breaths. She is taking shallow breaths. Wheezing noted bilaterally. Respirations equal and unlabored currently. Pain has improved some post morphine. She is alert and oriented x 4. She appears weak. She c/o dry mouth. Water provided and mouth care performed, moisturizer applied. Patient and daughter were updated. Kellogg RN

## 2023-05-21 NOTE — H&P (Signed)
History and Physical    Patient: [name] ZOX:[id number] DOB: 04-20-1934 DOA: 05/21/2023 DOS: the patient was seen and examined on 05/22/2023 PCP: [name], DO  Patient coming from: Home  Chief Complaint:  Chief Complaint  Patient presents with   Fever   HPI: [name] is a 89 y.o. female with medical history significant of hypertension, hyperlipidemia, atrial fibrillation, moderate to severe aortic stenosis, CKD stage IIIb, hypothyroidism, chronic respiratory failure on 2 LPM of oxygen 24/7 who presents to the emergency department due to 2-day onset of increasing shortness of breath and subjective fever, she complained of having a reddish-brown streak in the sputum once yesterday.  She also complained of pain across her chest which worsens with deep breath.  ED Course:  In the emergency department, BP was 142/50, other vital signs were within normal range.  O2 sat was 92% on 2 LPM of oxygen.  Workup in the ED showed leukocytosis, normocytic anemia, thrombocytopenia, lactic acid was normal, troponin x 1 was influenza A, B, SARS coronavirus 2, RSV was negative.,  Blood culture pending. Chest x-ray showed developing patchy pulmonary infiltrates bilaterally greater on the left, likely pneumonia.  Cardiac enlargement. Patient was started on IV ceftriaxone and azithromycin, breathing treatment was provided, morphine was given due to pain and Zofran was provided. Hospitalist was asked to admit patient for further evaluation and management.  Review of Systems: Review of systems as noted in the HPI. All other systems reviewed and are negative.   Past Medical History:  Diagnosis Date   Anemia    Arthritis    Asthma    Atrial fibrillation (HCC)    Basal cell carcinoma 02/06/2009   Left ear targus- ([facility])   Basal cell carcinoma 09/28/2008   Right back-([id number])   CKD (chronic kidney disease), stage III (HCC)    Heart murmur    Hyperlipidemia    Hypertension     Hypothyroidism    Nodule of right lung    Right upper lobe   Osteopenia due to cancer therapy 10/07/2022   Pneumonia    PONV (postoperative nausea and vomiting)    Renal insufficiency    Chronic   Renal vascular disease    Right renal artery stenosis (HCC) 05/09/2015   Squamous cell carcinoma of skin 09/07/2019   KA-Left shin-txpbx   Vertigo    Past Surgical History:  Procedure Laterality Date   BIOPSY  04/09/2021   Procedure: BIOPSY;  Surgeon: Dolores Frame, MD;  Location: [facility];  Service: Gastroenterology;;   BREAST LUMPECTOMY WITH RADIOACTIVE SEED LOCALIZATION Right 04/08/2018   Procedure: BREAST LUMPECTOMY WITH RADIOACTIVE SEED LOCALIZATION;  Surgeon: Emelia Loron, MD;  Location: [facility] OR;  Service: General;  Laterality: Right;   COLONOSCOPY  2016   diverticulosis, hemorrhoids   ESOPHAGOGASTRODUODENOSCOPY  2016   normal   ESOPHAGOGASTRODUODENOSCOPY (EGD) WITH PROPOFOL N/A 04/09/2021   Procedure: ESOPHAGOGASTRODUODENOSCOPY (EGD) WITH PROPOFOL;  Surgeon: Dolores Frame, MD;  Location: [facility];  Service: Gastroenterology;  Laterality: N/A;  12:45   ESOPHAGOGASTRODUODENOSCOPY (EGD) WITH PROPOFOL N/A 09/01/2021   Procedure: ESOPHAGOGASTRODUODENOSCOPY (EGD) WITH PROPOFOL;  Surgeon: Lanelle Bal, DO;  Location: [facility];  Service: Endoscopy;  Laterality: N/A;   IR GENERIC HISTORICAL  08/29/2016   IR US GUIDE VASC ACCESS RIGHT 08/29/2016 [facility]   IR GENERIC HISTORICAL  08/29/2016   IR RENAL BILAT S&I MOD SED 08/29/2016 [facility]   KNEE ARTHROSCOPY Right    2019   THYROIDECTOMY,  PARTIAL Right 1981   middle lobe removed 1st, then right lobe removed 7-8 years later (approx. 34)    Social History:  reports that she has never smoked. She has never used smokeless tobacco. She reports that she does not drink alcohol and does not use drugs.   Allergies  Allergen Reactions   Norvasc [Amlodipine] Swelling    Edema    Tape Other  (See Comments)    Has to have no stick tape.   Zestril [Lisinopril] Cough   Hytrin [Terazosin] Hives and Nausea Only   Imdur [Isosorbide Nitrate] Nausea Only   Sulfonamide Derivatives Nausea And Vomiting and Rash    Family History  Problem Relation Age of Onset   Thyroid disease Mother    Stroke Father    Hypertension Brother    Lung cancer Maternal Uncle    Cancer Maternal Grandmother        Liver   Lung cancer Maternal Uncle    Hypertension Daughter    Hypercholesterolemia Daughter    Hypercholesterolemia Daughter      Prior to Admission medications   Medication Sig Start Date End Date Taking? Authorizing Provider  atorvastatin (LIPITOR) 20 MG tablet TAKE 1/2 TABLET BY MOUTH DAILY Patient taking differently: Take 10 mg by mouth at bedtime. 03/19/23  Yes Gottschalk, Kathie Rhodes M, DO  azelastine (ASTELIN) 0.1 % nasal spray Place 1 spray into both nostrils 2 (two) times daily. For runny nose 05/19/23  Yes Gottschalk, Ashly M, DO  bisoprolol (ZEBETA) 5 MG tablet TAKE 1 TABLET (5 MG TOTAL) BY MOUTH DAILY. 08/08/22  Yes Delynn Flavin M, DO  Cholecalciferol (VITAMIN D-3 PO) Take 1 tablet by mouth at bedtime.   Yes [provider]  Cyanocobalamin (VITAMIN B-12 PO) Take 1 tablet by mouth at bedtime.   Yes [provider]  feeding supplement (ENSURE ENLIVE / ENSURE PLUS) LIQD Take 237 mLs by mouth 2 (two) times daily between meals. Patient taking differently: Take 237 mLs by mouth daily as needed (decreased appetite). 08/28/22  Yes Tat, Onalee Hua, MD  ferrous sulfate 325 (65 FE) MG tablet Take 325 mg by mouth daily with breakfast.   Yes [provider]  flecainide (TAMBOCOR) 50 MG tablet TAKE 1 TABLET BY MOUTH TWICE A DAY 09/26/22  Yes Rollene Rotunda, MD  hydrALAZINE (APRESOLINE) 50 MG tablet Take 1 tablet (50 mg total) by mouth 2 (two) times daily. 04/04/23  Yes Lewie Chamber, MD  letrozole [facility]) 2.5 MG tablet TAKE 1 TABLET BY MOUTH EVERY DAY 01/22/23  Yes Doreatha Massed, MD  levothyroxine (SYNTHROID) 75 MCG tablet TAKE 1 TABLET BY MOUTH EVERY DAY Patient taking differently: Take 75 mcg by mouth daily before breakfast. 01/09/23  Yes Gottschalk, Ashly M, DO  mirtazapine (REMERON) 7.5 MG tablet TAKE 1 TABLET BY MOUTH EVERYDAY AT BEDTIME 02/27/23  Yes Harper, Kristen S, PA-C  Multiple Vitamins-Minerals (MULTIVITAMIN WOMEN 50+) TABS Take 1 tablet by mouth at bedtime.   Yes [provider]  pantoprazole (PROTONIX) 40 MG tablet TAKE 1 TABLET (40 MG TOTAL) BY MOUTH TWICE A DAY BEFORE MEALS Patient taking differently: Take 80 mg by mouth daily. 01/16/23  Yes Letta Median, PA-C  torsemide (DEMADEX) 20 MG tablet Take 2 tablets (40 mg total) by mouth daily. 05/09/23  Yes TatOnalee Hua, MD    Physical Exam: BP (!) 99/39   Pulse 82   Temp 97.9 F (36.6 C) (Axillary)   Resp (!) 25   Wt 48 kg   SpO2 95%  BMI 18.16 kg/m   General: 89 y.o. year-old female well developed well nourished in no acute distress.  Alert and oriented x3. HEENT: NCAT, EOMI, dry mucous membrane Neck: Supple, trachea medial Cardiovascular: Regular rate and rhythm with no rubs or gallops.  No thyromegaly or JVD noted.  No lower extremity edema. 2/4 pulses in all 4 extremities. Respiratory: Tachypnea.  Diffuse rhonchi on auscultation with no wheezes.   Abdomen: Soft, nontender nondistended with normal bowel sounds x4 quadrants. Muskuloskeletal: No cyanosis, clubbing or edema noted bilaterally Neuro: CN II-XII intact, strength 5/5 x 4, sensation, reflexes intact Skin: No ulcerative lesions noted or rashes Psychiatry: Judgement and insight appear normal. Mood is appropriate for condition and setting          Labs on Admission:  Basic Metabolic Panel: Recent Labs  Lab 05/18/23 0930 05/18/23 0933 05/21/23 2129  NA 141 141 131*  K 4.2 4.0 3.9  CL 91* 92* 87*  CO2 35* 36* 32  GLUCOSE 97 97 139*  BUN 43* 42* 62*  CREATININE 1.85* 1.84* 2.17*  CALCIUM 9.2 9.0 8.4*  PHOS  4.0  --   --    Liver Function Tests: Recent Labs  Lab 05/18/23 0930 05/21/23 2129  AST  --  22  ALT  --  14  ALKPHOS  --  47  BILITOT  --  1.2  PROT  --  7.1  ALBUMIN 4.2 3.3*   No results for input(s): "LIPASE", "AMYLASE" in the last 168 hours. No results for input(s): "AMMONIA" in the last 168 hours. CBC: Recent Labs  Lab 05/20/23 1303 05/21/23 2129  WBC 15.5* 16.2*  NEUTROABS  --  14.6*  HGB 10.1* 9.2*  HCT 31.9* 29.9*  MCV 88.4 88.2  PLT 110* 89*   Cardiac Enzymes: No results for input(s): "CKTOTAL", "CKMB", "CKMBINDEX", "TROPONINI" in the last 168 hours.  BNP (last 3 results) Recent Labs    05/03/23 1302 05/04/23 0455 05/05/23 0536  BNP 654.0* 472.0* 233.0*    ProBNP (last 3 results) No results for input(s): "PROBNP" in the last 8760 hours.  CBG: No results for input(s): "GLUCAP" in the last 168 hours.  Radiological Exams on Admission: DG Chest 2 View  Result Date: 05/21/2023 CLINICAL DATA:  Fever and productive cough and weakness for 2 days. Elevated white cell count yesterday. EXAM: CHEST - 2 VIEW COMPARISON:  05/05/2023 FINDINGS: Cardiac enlargement. Patchy infiltrates in the lungs bilaterally, progressing since prior study, suggesting multifocal pneumonia. Small left pleural effusion. No pneumothorax. Mediastinal contours appear intact. IMPRESSION: Developing patchy pulmonary infiltrates bilaterally, greater on the left, likely pneumonia. Cardiac enlargement. Electronically Signed   By: Burman Nieves M.D.   On: 05/21/2023 21:05    EKG: I independently viewed the EKG done and my findings are as followed: Normal sinus rhythm at a rate of 75 bpm with QTc of 499 ms  Assessment/Plan Present on Admission:  CAP (community acquired pneumonia)  Acute on chronic respiratory failure with hypoxia (HCC)  Anemia of chronic disease  Acute-on-chronic kidney injury (HCC)  Essential hypertension  Mixed hyperlipidemia  GERD without esophagitis  Principal  Problem:   CAP (community acquired pneumonia) Active Problems:   Mixed hyperlipidemia   Essential hypertension   History of right breast cancer   Anemia of chronic disease   Acute-on-chronic kidney injury (HCC)   GERD without esophagitis   Acute on chronic respiratory failure with hypoxia (HCC)   Leukocytosis   Hypoalbuminemia due to protein-calorie malnutrition (HCC)   CAP POA Patient  was started on ceftriaxone and azithromycin, we shall continue same at this time with plan to de-escalate/discontinue based on blood culture, sputum culture, urine Legionella, strep pneumo and procalcitonin Continue Tylenol as needed Continue Mucinex, incentive spirometry, flutter valve   Acute on chronic respiratory failure with hypoxia in setting of above Continue supplemental oxygen to maintain O2 sat greater than 92% with plan to wean patient down to home oxygen requirement as tolerated  Leukocytosis possibly secondary to CAP vs reactive WBC 16.2, continue management as described for community-acquired pneumonia Continue to monitor WBC with morning labs  Thrombocytopenia Platelets 89, no sign of bleeding Continue to monitor platelet levels monitor labs  Anemia of chronic disease Hemoglobin at 9.2, stable Continue ferrous sulfate Continue to monitor CBC with morning labs  AKI on CKD 4 Creatinine 2.17 (baseline creatinine at 1.5-1.8) Gentle IV hydration will be provided  Essential hypertension Continue Zebeta, hydralazine  Hypothyroidism Continue Synthroid  GERD Continue Protonix  Hypoalbuminemia possibly secondary to mild protein calorie malnutrition Albumin 3.3 ; continue Ensure  History of right breast cancer She seems to be in remission Continue letrozole  DVT prophylaxis: SCDs   Advance Care Planning: CODE STATUS: DNR  Consults: None  Family Communication: Daughter at bedside (all questions answered to satisfaction)  Severity of Illness: The appropriate patient  status for this patient is INPATIENT. Inpatient status is judged to be reasonable and necessary in order to provide the required intensity of service to ensure the patient's safety. The patient's presenting symptoms, physical exam findings, and initial radiographic and laboratory data in the context of their chronic comorbidities is felt to place them at high risk for further clinical deterioration. Furthermore, it is not anticipated that the patient will be medically stable for discharge from the hospital within 2 midnights of admission.   * I certify that at the point of admission it is my clinical judgment that the patient will require inpatient hospital care spanning beyond 2 midnights from the point of admission due to high intensity of service, high risk for further deterioration and high frequency of surveillance required.*  Author: Frankey Shown, DO 05/22/2023 1:38 AM  For on call review [email or web address].

## 2023-05-21 NOTE — ED Triage Notes (Signed)
C/o fever, productive cough with blood streaked sputum, and weakness x2 days.  Pt had routine labs drawn yesterday with elevated WBC.  2lpm  baseline

## 2023-05-22 ENCOUNTER — Encounter: Payer: Self-pay | Admitting: *Deleted

## 2023-05-22 ENCOUNTER — Ambulatory Visit: Payer: Self-pay | Admitting: *Deleted

## 2023-05-22 ENCOUNTER — Encounter (HOSPITAL_COMMUNITY): Payer: Self-pay | Admitting: Internal Medicine

## 2023-05-22 DIAGNOSIS — E46 Unspecified protein-calorie malnutrition: Secondary | ICD-10-CM | POA: Insufficient documentation

## 2023-05-22 DIAGNOSIS — J189 Pneumonia, unspecified organism: Secondary | ICD-10-CM | POA: Diagnosis not present

## 2023-05-22 DIAGNOSIS — D72829 Elevated white blood cell count, unspecified: Secondary | ICD-10-CM | POA: Insufficient documentation

## 2023-05-22 LAB — CBC
HCT: 27.3 % — ABNORMAL LOW (ref 36.0–46.0)
Hemoglobin: 8.4 g/dL — ABNORMAL LOW (ref 12.0–15.0)
MCH: 27.5 pg (ref 26.0–34.0)
MCHC: 30.8 g/dL (ref 30.0–36.0)
MCV: 89.2 fL (ref 80.0–100.0)
Platelets: 84 10*3/uL — ABNORMAL LOW (ref 150–400)
RBC: 3.06 MIL/uL — ABNORMAL LOW (ref 3.87–5.11)
RDW: 15.8 % — ABNORMAL HIGH (ref 11.5–15.5)
WBC: 13.1 10*3/uL — ABNORMAL HIGH (ref 4.0–10.5)
nRBC: 0 % (ref 0.0–0.2)

## 2023-05-22 LAB — BLOOD GAS, VENOUS
Acid-Base Excess: 11.5 mmol/L — ABNORMAL HIGH (ref 0.0–2.0)
Bicarbonate: 39.3 mmol/L — ABNORMAL HIGH (ref 20.0–28.0)
Drawn by: 27160
O2 Saturation: 96.9 %
Patient temperature: 36.8
pCO2, Ven: 64 mm[Hg] — ABNORMAL HIGH (ref 44–60)
pH, Ven: 7.39 (ref 7.25–7.43)
pO2, Ven: 71 mm[Hg] — ABNORMAL HIGH (ref 32–45)

## 2023-05-22 LAB — COMPREHENSIVE METABOLIC PANEL
ALT: 13 U/L (ref 0–44)
AST: 20 U/L (ref 15–41)
Albumin: 2.8 g/dL — ABNORMAL LOW (ref 3.5–5.0)
Alkaline Phosphatase: 45 U/L (ref 38–126)
Anion gap: 9 (ref 5–15)
BUN: 66 mg/dL — ABNORMAL HIGH (ref 8–23)
CO2: 33 mmol/L — ABNORMAL HIGH (ref 22–32)
Calcium: 7.9 mg/dL — ABNORMAL LOW (ref 8.9–10.3)
Chloride: 89 mmol/L — ABNORMAL LOW (ref 98–111)
Creatinine, Ser: 2.34 mg/dL — ABNORMAL HIGH (ref 0.44–1.00)
GFR, Estimated: 20 mL/min — ABNORMAL LOW (ref >60)
Glucose, Bld: 129 mg/dL — ABNORMAL HIGH (ref 70–99)
Potassium: 3.9 mmol/L (ref 3.5–5.1)
Sodium: 131 mmol/L — ABNORMAL LOW (ref 135–145)
Total Bilirubin: 1.1 mg/dL (ref 0.3–1.2)
Total Protein: 5.9 g/dL — ABNORMAL LOW (ref 6.5–8.1)

## 2023-05-22 LAB — PHOSPHORUS: Phosphorus: 3.3 mg/dL (ref 2.5–4.6)

## 2023-05-22 LAB — STREP PNEUMONIAE URINARY ANTIGEN: Strep Pneumo Urinary Antigen: NEGATIVE

## 2023-05-22 LAB — MAGNESIUM: Magnesium: 2 mg/dL (ref 1.7–2.4)

## 2023-05-22 LAB — TROPONIN I (HIGH SENSITIVITY): Troponin I (High Sensitivity): 14 ng/L (ref <18)

## 2023-05-22 MED ORDER — ACETAMINOPHEN 325 MG PO TABS
650.0000 mg | ORAL_TABLET | Freq: Four times a day (QID) | ORAL | Status: DC | PRN
  Administered 2023-05-22 – 2023-05-23 (×2): 650 mg via ORAL
  Filled 2023-05-22 (×2): qty 2

## 2023-05-22 MED ORDER — ENSURE ENLIVE PO LIQD
237.0000 mL | Freq: Two times a day (BID) | ORAL | Status: DC
  Administered 2023-05-22: 237 mL via ORAL
  Filled 2023-05-22 (×6): qty 237

## 2023-05-22 MED ORDER — ENSURE ENLIVE PO LIQD
237.0000 mL | Freq: Three times a day (TID) | ORAL | Status: DC
  Administered 2023-05-23 – 2023-05-27 (×13): 237 mL via ORAL

## 2023-05-22 MED ORDER — HYDRALAZINE HCL 25 MG PO TABS: 50.0000 mg | ORAL_TABLET | Freq: Two times a day (BID) | ORAL | Status: DC

## 2023-05-22 MED ORDER — ADULT MULTIVITAMIN W/MINERALS CH
1.0000 | ORAL_TABLET | Freq: Every day | ORAL | Status: DC
  Administered 2023-05-22 – 2023-05-27 (×6): 1 via ORAL
  Filled 2023-05-22 (×6): qty 1

## 2023-05-22 MED ORDER — ONDANSETRON HCL 4 MG/2ML IJ SOLN: 4.0000 mg | Freq: Four times a day (QID) | INTRAMUSCULAR | Status: DC | PRN

## 2023-05-22 MED ORDER — SODIUM CHLORIDE 0.9 % IV SOLN
1.0000 g | INTRAVENOUS | Status: AC
  Administered 2023-05-22 – 2023-05-25 (×4): 1 g via INTRAVENOUS
  Filled 2023-05-22 (×4): qty 10

## 2023-05-22 MED ORDER — LETROZOLE 2.5 MG PO TABS
2.5000 mg | ORAL_TABLET | Freq: Every day | ORAL | Status: DC
  Administered 2023-05-22 – 2023-05-27 (×6): 2.5 mg via ORAL
  Filled 2023-05-22 (×8): qty 1

## 2023-05-22 MED ORDER — BISOPROLOL FUMARATE 5 MG PO TABS
5.0000 mg | ORAL_TABLET | Freq: Every day | ORAL | Status: DC
  Administered 2023-05-22: 5 mg via ORAL
  Filled 2023-05-22: qty 1

## 2023-05-22 MED ORDER — HEPARIN SODIUM (PORCINE) 5000 UNIT/ML IJ SOLN: 5000.0000 [IU] | Freq: Three times a day (TID) | INTRAMUSCULAR | Status: DC

## 2023-05-22 MED ORDER — LIDOCAINE 5 % EX PTCH
1.0000 | MEDICATED_PATCH | Freq: Once | CUTANEOUS | Status: AC
  Administered 2023-05-22: 1 via TRANSDERMAL
  Filled 2023-05-22: qty 1

## 2023-05-22 MED ORDER — ATORVASTATIN CALCIUM 10 MG PO TABS
10.0000 mg | ORAL_TABLET | Freq: Every day | ORAL | Status: DC
  Administered 2023-05-22 – 2023-05-27 (×6): 10 mg via ORAL
  Filled 2023-05-22 (×6): qty 1

## 2023-05-22 MED ORDER — ACETAMINOPHEN 650 MG RE SUPP: 650.0000 mg | Freq: Four times a day (QID) | RECTAL | Status: DC | PRN

## 2023-05-22 MED ORDER — SODIUM CHLORIDE 0.9 % IV BOLUS
500.0000 mL | Freq: Once | INTRAVENOUS | Status: AC
  Administered 2023-05-22: 500 mL via INTRAVENOUS

## 2023-05-22 MED ORDER — LEVOTHYROXINE SODIUM 75 MCG PO TABS
75.0000 ug | ORAL_TABLET | Freq: Every day | ORAL | Status: DC
  Administered 2023-05-22 – 2023-05-27 (×6): 75 ug via ORAL
  Filled 2023-05-22: qty 2
  Filled 2023-05-22 (×5): qty 1

## 2023-05-22 MED ORDER — FERROUS SULFATE 325 (65 FE) MG PO TABS
325.0000 mg | ORAL_TABLET | Freq: Every day | ORAL | Status: DC
  Administered 2023-05-22 – 2023-05-27 (×6): 325 mg via ORAL
  Filled 2023-05-22 (×6): qty 1

## 2023-05-22 MED ORDER — ONDANSETRON HCL 4 MG PO TABS: 4.0000 mg | ORAL_TABLET | Freq: Four times a day (QID) | ORAL | Status: DC | PRN

## 2023-05-22 MED ORDER — PANTOPRAZOLE SODIUM 40 MG PO TBEC
80.0000 mg | DELAYED_RELEASE_TABLET | Freq: Every day | ORAL | Status: DC
  Administered 2023-05-22 – 2023-05-27 (×6): 80 mg via ORAL
  Filled 2023-05-22 (×7): qty 2

## 2023-05-22 MED ORDER — DM-GUAIFENESIN ER 30-600 MG PO TB12
1.0000 | ORAL_TABLET | Freq: Two times a day (BID) | ORAL | Status: DC
  Administered 2023-05-22 – 2023-05-27 (×12): 1 via ORAL
  Filled 2023-05-22 (×12): qty 1

## 2023-05-22 MED ORDER — SODIUM CHLORIDE 0.9 % IV SOLN: INTRAVENOUS | Status: DC

## 2023-05-22 NOTE — Progress Notes (Signed)
Initial Nutrition Assessment  DOCUMENTATION CODES:   Non-severe (moderate) malnutrition in context of chronic illness  INTERVENTION:   Ensure Plus High Protein po BID, each supplement provides 350 kcal and 20 grams of protein. Magic cup TID with meals, each supplement provides 290 kcal and 9 grams of protein. MVI with minerals daily. "Heart Failure Nutrition Therapy for the Undernourished" handout provided in discharge instructions.  NUTRITION DIAGNOSIS:   Severe Malnutrition related to chronic illness (CKD, asthma) as evidenced by severe muscle depletion, severe fat depletion.  GOAL:   Patient will meet greater than or equal to 90% of their needs  MONITOR:   PO intake, Supplement acceptance  REASON FOR ASSESSMENT:   Consult Assessment of nutrition requirement/status  ASSESSMENT:   89 yo female admitted with CAP. PMH includes HTN, HLD, a fib, aortic stenosis, CKD stage IIIb, hypothyroidism, asthma, chronic respiratory failure on 2 L oxygen at baseline, vertigo, skin cancer, anemia, osteopenia.  Patient reports that she has been eating poorly for a few days d/t no appetite. Daughter confirms that she is usually a good eater, but is currently not feeling like eating very much. Her weight has been stable. She is chronically underweight with BMI=18.16. She likes butter pecan flavored supplements. Also likes strawberry per daughter, but does not like vanilla. Patient ate a few bites of her lunch tray during RD visit, but seemed very tired after taking just a few bites. Daughter reports that she is trying to follow a heart healthy diet at home d/t HF. Attached "Heart Failure Nutrition Therapy for the Undernourished" handout from the [employer] to discharge instructions.   Labs reviewed. Na 131  Medications reviewed and include ferrous sulfate.  Weight history reviewed. No significant weight changes noted recently. Patient meets criteria for severe  malnutrition, given severe depletion of muscle and subcutaneous fat mass.  NUTRITION - FOCUSED PHYSICAL EXAM:  Flowsheet Row Most Recent Value  Orbital Region Severe depletion  Upper Arm Region Moderate depletion  Thoracic and Lumbar Region Severe depletion  Buccal Region Moderate depletion  Temple Region Moderate depletion  Clavicle Bone Region Severe depletion  Clavicle and Acromion Bone Region Severe depletion  Scapular Bone Region Severe depletion  Dorsal Hand Severe depletion  Patellar Region Severe depletion  Anterior Thigh Region Severe depletion  Posterior Calf Region Severe depletion  Edema (RD Assessment) Mild  Hair Reviewed  Eyes Reviewed  Mouth Reviewed  Skin Reviewed  Nails Reviewed       Diet Order:   Diet Order             Diet Heart Room service appropriate? Yes; Fluid consistency: Thin  Diet effective now                   EDUCATION NEEDS:   Education needs have been addressed  Skin:  Skin Assessment: Reviewed RN Assessment  Last BM:  10/3  Height:   Ht Readings from Last 1 Encounters:  05/19/23 5\' 4"  (1.626 m)    Weight:   Wt Readings from Last 1 Encounters:  05/21/23 48 kg    Ideal Body Weight:  54.5 kg  BMI:  Body mass index is 18.16 kg/m.  Estimated Nutritional Needs:   Kcal:  1450-1650  Protein:  65-80 gm  Fluid:  >/= 1.5 L   Gabriel Rainwater RD, LDN, CNSC Please refer to Amion for contact information.

## 2023-05-22 NOTE — Hospital Course (Addendum)
89 y.o.f w/ HTN,HLD,atrial fibrillation, moderate to severe aortic stenosis,CKD stage IIIb b/l creat ~1.5-1.8 , hypothyroidism, chronic respiratory failure on 2 LPM of oxygen 24/7 presented w/ 2-day history of shortness of breath, subjective fever  and having a reddish-brown streak in the sputum once 1 day PTA and pain across her chest which worsens with deep breath. In ED:BP was 142/50, and other vitals stable, O2 sat was 92% on 2 LPM of oxygen.  Labs showed leukocytosis normocytic anemia thrombocytopenia normal lactic acid and troponin, influenza A, B, SARS coronavirus 2, RSV was negative.Blood culture sent. CXR>> Developing patchy pulmonary infiltrates bilaterally greater on the left, likely pneumonia. Started on ceftriaxone and azithromycin, breathing treatment and admitted for further management. 05/23/23> further tachypnea worsening of shortness of breath and chest x-ray with worsening aeration in the lung, given a dose of Lasix IV fluid discontinued.

## 2023-05-22 NOTE — Progress Notes (Signed)
PROGRESS NOTE [name]  ZOX:[id number] DOB: [birth year] DOA: 05/21/2023 PCP: [name], DO  Brief Narrative/Hospital Course: 89 y.o.f w/ HTN,HLD,atrial fibrillation, moderate to severe aortic stenosis,CKD stage IIIb b/l creat ~1.5-1.8 , hypothyroidism, chronic respiratory failure on 2 LPM of oxygen 24/7 presented w/ 2-day history of shortness of breath, subjective fever  and having a reddish-brown streak in the sputum once 1 day PTA and pain across her chest which worsens with deep breath. In ED:BP was 142/50, and other vitals stable, O2 sat was 92% on 2 LPM of oxygen.  Labs showed leukocytosis normocytic anemia thrombocytopenia normal lactic acid and troponin, influenza A, B, SARS coronavirus 2, RSV was negative.Blood culture sent. CXR>> Developing patchy pulmonary infiltrates bilaterally greater on the left, likely pneumonia. Started on ceftriaxone and azithromycin, breathing treatment and admitted for further management.    Subjective: Patient seen and examined this morning Overnight remains afebrile hemodynamically stable BP 99-100, on 3 to nasal cannula Complains of pain on the right chest wall. Labs showing improving WBC count 13.1 chronic anemia hemoglobin 8.4 thrombocytopenia 84, creatinine slightly up at 2.3 bicarb 33 normal LFTs  Assessment and Plan: Principal Problem:   CAP (community acquired pneumonia) Active Problems:   Mixed hyperlipidemia   Essential hypertension   History of right breast cancer   Anemia of chronic disease   Acute-on-chronic kidney injury (HCC)   GERD without esophagitis   Acute on chronic respiratory failure with hypoxia (HCC)   Leukocytosis   Hypoalbuminemia due to protein-calorie malnutrition (HCC)   Community-acquired pneumonia Met sepsis criteria on admission-with leukocytosis/tachypnea and CAP. Acute on chronic respiratory failure with hypoxia and hypercapnia- o n2l [place] at baseline Chronic metabolic alkalosis in the setting of  hypercapnea: Presented with leukocytosis, shortness of breath subjective fever.  BP soft but lactic acid was normal.  Continue ceftriaxone azithromycin follow-up sputum culture urine antigen for septic request and Legionella and blood culture. Continue Tylenol and Mucinex incentive spirometry, CONT supplemental oxygen to keep saturation at least 93% or above/ checked vbg-it shows chronic hypercapnia  and has elevated CO2.  Chronic thrombocytopenia: Monitor platelet count IN 80sK baseline. Recent Labs  Lab 05/20/23 1303 05/21/23 2129 05/22/23 0523  PLT 110* 89* 84*    Anemia of chronic disease: Hemoglobin slightly lower than baseline, monitor continue iron supplement Recent Labs  Lab 05/20/23 1303 05/21/23 2129 05/22/23 0523  HGB 10.1* 9.2* 8.4*  HCT 31.9* 29.9* 27.3*    AKI on CKD 4: C/l creatinine 1.5-1 .8.Noted bump 2s. Keep on  IVF,avoid nephrotoxic medication, renally dose meds, monitor urine output Recent Labs    05/03/23 1302 05/04/23 0455 05/05/23 0536 05/06/23 0448 05/07/23 0445 05/08/23 0458 05/18/23 0930 05/18/23 0933 05/21/23 2129 05/22/23 0523  BUN 25* 26* 30* 30* 27* 33* 43* 42* 62* 66*  CREATININE 1.40* 1.66* 1.69* 1.74* 1.50* 1.54* 1.85* 1.84* 2.17* 2.34*  CO2 34* 35* 36* 34* 33* 33* 35* 36* 32 33*  K 4.0 3.6 4.0 3.7 3.8 4.0 4.2 4.0 3.9 3.9     Essential hypertension: BP borderline, will hold further bisoprolol and hydralazine, will give bolus 500 cc x 1 and monitor   HLD: continue statin    Hypothyroidism: Continue Synthroid.check tsh in am.   GERD: Continue Protonix   History of right breast cancer: She seems to be in remission,Continue letrozole  Low BMI Hypoalbuminemia 2.8: RD consulted to augment nutritional status  DVT prophylaxis: SCDs Start: 05/22/23 0100 Code Status:   Code Status: Limited: Do not attempt resuscitation (DNR) -  DNR-LIMITED -Do Not Intubate/DNI  Family Communication: plan of care discussed with patient at bedside.  Called daughter Karlyn Agee, no answer. Tried again and able to reach at her cell phone and updated.  Patient status is: Inpatient because of pneumonia Level of care: Med-Surg   Dispo: The patient is from: Home w/ daughter            Anticipated disposition: TBD Objective: Vitals last 24 hrs: Vitals:   05/22/23 1000 05/22/23 1001 05/22/23 1030 05/22/23 1100  BP: (!) 106/39  (!) 100/35 (!) 110/37  Pulse: 73  72 72  Resp: 19  20 (!) 28  Temp:  98.6 F (37 C)    TempSrc:  Oral    SpO2: 93%  95% 96%  Weight:       Weight change:   Physical Examination: General exam: alert awake, ill and frail appearing HEENT:Oral mucosa moist, Ear/Nose WNL grossly Respiratory system: bilaterally crackles left base,no use of accessory muscle Cardiovascular system: S1 & S2 +, No JVD. Gastrointestinal system: Abdomen soft,NT,ND, BS+ Nervous System:Alert, awake, moving extremities. Extremities: LE edema neg,distal peripheral pulses palpable.  Skin: No rashes,no icterus. MSK: Normal muscle bulk,tone, power  Medications reviewed:  Scheduled Meds:  atorvastatin  10 mg Oral Daily   dextromethorphan-guaiFENesin  1 tablet Oral BID   feeding supplement  237 mL Oral BID BM   ferrous sulfate  325 mg Oral Q breakfast   letrozole  2.5 mg Oral Daily   levothyroxine  75 mcg Oral Q0600   lidocaine  1 patch Transdermal Once   pantoprazole  80 mg Oral Daily   Continuous Infusions:  azithromycin Stopped (05/21/23 2350)   cefTRIAXone (ROCEPHIN)  IV     sodium chloride        Diet Order             Diet Heart Room service appropriate? Yes; Fluid consistency: Thin  Diet effective now                  Intake/Output Summary (Last 24 hours) at 05/22/2023 1130 Last data filed at 05/22/2023 0556 Gross per 24 hour  Intake 449.38 ml  Output 300 ml  Net 149.38 ml   Net IO Since Admission: 149.38 mL [05/22/23 1130]  Wt Readings from Last 3 Encounters:  05/21/23 48 kg  05/19/23 48.1 kg  05/14/23 49 kg      Unresulted Labs (From admission, onward)     Start     Ordered   05/23/23 0500  Basic metabolic panel  Daily,   R      05/22/23 0842   05/23/23 0500  CBC  Daily,   R      05/22/23 0842   05/22/23 0105  Expectorated Sputum Assessment w Gram Stain, Rflx to Resp Cult  (COPD / Pneumonia / Cellulitis / Lower Extremity Wound)  Once,   R        05/22/23 0105   05/22/23 0105  Legionella Pneumophila Serogp 1 Ur Ag  (COPD / Pneumonia / Cellulitis / Lower Extremity Wound)  Once,   R        05/22/23 0105   05/22/23 0105  Strep pneumoniae urinary antigen  (COPD / Pneumonia / Cellulitis / Lower Extremity Wound)  Once,   R        05/22/23 0105          Data Reviewed: I have personally reviewed following labs and imaging studies CBC: Recent Labs  Lab 05/20/23 1303 05/21/23 2129 05/22/23  0523  WBC 15.5* 16.2* 13.1*  NEUTROABS  --  14.6*  --   HGB 10.1* 9.2* 8.4*  HCT 31.9* 29.9* 27.3*  MCV 88.4 88.2 89.2  PLT 110* 89* 84*   Basic Metabolic Panel: Recent Labs  Lab 05/18/23 0930 05/18/23 0933 05/21/23 2129 05/22/23 0523  NA 141 141 131* 131*  K 4.2 4.0 3.9 3.9  CL 91* 92* 87* 89*  CO2 35* 36* 32 33*  GLUCOSE 97 97 139* 129*  BUN 43* 42* 62* 66*  CREATININE 1.85* 1.84* 2.17* 2.34*  CALCIUM 9.2 9.0 8.4* 7.9*  MG  --   --   --  2.0  PHOS 4.0  --   --  3.3   GFR: Estimated Creatinine Clearance: 12.8 mL/min (A) (by C-G formula based on SCr of 2.34 mg/dL (H)). Liver Function Tests: Recent Labs  Lab 05/18/23 0930 05/21/23 2129 05/22/23 0523  AST  --  22 20  ALT  --  14 13  ALKPHOS  --  47 45  BILITOT  --  1.2 1.1  PROT  --  7.1 5.9*  ALBUMIN 4.2 3.3* 2.8*   Recent Labs  Lab 05/21/23 2133  LATICACIDVEN 0.6    Recent Results (from the past 240 hour(s))  Resp panel by RT-PCR (RSV, Flu A&B, Covid) Anterior Nasal Swab     Status: None   Collection Time: 05/21/23  7:59 PM   Specimen: Anterior Nasal Swab  Result Value Ref Range Status   SARS Coronavirus 2 by RT PCR  NEGATIVE NEGATIVE Final    Comment: (NOTE) SARS-CoV-2 target nucleic acids are NOT DETECTED.  The SARS-CoV-2 RNA is generally detectable in upper respiratory specimens during the acute phase of infection. The lowest concentration of SARS-CoV-2 viral copies this assay can detect is 138 copies/mL. A negative result does not preclude SARS-Cov-2 infection and should not be used as the sole basis for treatment or other patient management decisions. A negative result may occur with  improper specimen collection/handling, submission of specimen other than nasopharyngeal swab, presence of viral mutation(s) within the areas targeted by this assay, and inadequate number of viral copies(<138 copies/mL). A negative result must be combined with clinical observations, patient history, and epidemiological information. The expected result is Negative.  Fact Sheet for Patients:  BloggerCourse.com  Fact Sheet for Healthcare Providers:  SeriousBroker.it  This test is no t yet approved or cleared by the [place] FDA and  has been authorized for detection and/or diagnosis of SARS-CoV-2 by FDA under an Emergency Use Authorization (EUA). This EUA will remain  in effect (meaning this test can be used) for the duration of the COVID-19 declaration under Section 564(b)(1) of the Act, 21 U.S.C.section 360bbb-3(b)(1), unless the authorization is terminated  or revoked sooner.       Influenza A by PCR NEGATIVE NEGATIVE Final   Influenza B by PCR NEGATIVE NEGATIVE Final    Comment: (NOTE) The Xpert Xpress SARS-CoV-2/FLU/RSV plus assay is intended as an aid in the diagnosis of influenza from Nasopharyngeal swab specimens and should not be used as a sole basis for treatment. Nasal washings and aspirates are unacceptable for Xpert Xpress SARS-CoV-2/FLU/RSV testing.  Fact Sheet for Patients: BloggerCourse.com  Fact Sheet for  Healthcare Providers: SeriousBroker.it  This test is not yet approved or cleared by the [place] FDA and has been authorized for detection and/or diagnosis of SARS-CoV-2 by FDA under an Emergency Use Authorization (EUA). This EUA will remain in effect (meaning this test can be used)  for the duration of the COVID-19 declaration under Section 564(b)(1) of the Act, 21 U.S.C. section 360bbb-3(b)(1), unless the authorization is terminated or revoked.     Resp Syncytial Virus by PCR NEGATIVE NEGATIVE Final    Comment: (NOTE) Fact Sheet for Patients: BloggerCourse.com  Fact Sheet for Healthcare Providers: SeriousBroker.it  This test is not yet approved or cleared by the [place] FDA and has been authorized for detection and/or diagnosis of SARS-CoV-2 by FDA under an Emergency Use Authorization (EUA). This EUA will remain in effect (meaning this test can be used) for the duration of the COVID-19 declaration under Section 564(b)(1) of the Act, 21 U.S.C. section 360bbb-3(b)(1), unless the authorization is terminated or revoked.  Performed at [facility], [address] [place], [place] [address]   Culture, blood (routine x 2)     Status: None (Preliminary result)   Collection Time: 05/21/23  9:29 PM   Specimen: BLOOD  Result Value Ref Range Status   Specimen Description BLOOD BLOOD LEFT ARM  Final   Special Requests   Final    BOTTLES DRAWN AEROBIC AND ANAEROBIC Blood Culture results may not be optimal due to an excessive volume of blood received in culture bottles   Culture   Final    NO GROWTH < 12 HOURS Performed at [facility], [address], [place], [place] [address]    Report Status PENDING  Incomplete  Culture, blood (routine x 2)     Status: None (Preliminary result)   Collection Time: 05/21/23  9:33 PM   Specimen: BLOOD  Result Value Ref Range Status   Specimen Description BLOOD BLOOD  RIGHT ARM  Final   Special Requests   Final    BOTTLES DRAWN AEROBIC AND ANAEROBIC Blood Culture adequate volume   Culture   Final    NO GROWTH < 12 HOURS Performed at [facility], [address], [place], [place] [address]    Report Status PENDING  Incomplete    Antimicrobials: Anti-infectives (From admission, onward)    Start     Dose/Rate Route Frequency Ordered Stop   05/22/23 2200  cefTRIAXone (ROCEPHIN) 1 g in sodium chloride 0.9 % 100 mL IVPB        1 g 200 mL/hr over 30 Minutes Intravenous Every 24 hours 05/22/23 0836 05/26/23 2159   05/21/23 2115  cefTRIAXone (ROCEPHIN) 1 g in sodium chloride 0.9 % 100 mL IVPB        1 g 200 mL/hr over 30 Minutes Intravenous  Once 05/21/23 2113 05/21/23 2238   05/21/23 2115  azithromycin (ZITHROMAX) 500 mg in sodium chloride 0.9 % 250 mL IVPB        500 mg 250 mL/hr over 60 Minutes Intravenous Every 24 hours 05/21/23 2113        Culture/Microbiology    Component Value Date/Time   SDES BLOOD BLOOD RIGHT ARM 05/21/2023 2133   SPECREQUEST  05/21/2023 2133    BOTTLES DRAWN AEROBIC AND ANAEROBIC Blood Culture adequate volume   CULT  05/21/2023 2133    NO GROWTH < 12 HOURS Performed at [facility], [address] [place], [place] [address]    REPTSTATUS PENDING 05/21/2023 2133    Radiology Studies: DG Chest 2 View  Result Date: 05/21/2023 CLINICAL DATA:  Fever and productive cough and weakness for 2 days. Elevated white cell count yesterday. EXAM: CHEST - 2 VIEW COMPARISON:  05/05/2023 FINDINGS: Cardiac enlargement. Patchy infiltrates in the lungs bilaterally, progressing since prior study, suggesting multifocal pneumonia. Small  left pleural effusion. No pneumothorax. Mediastinal contours appear intact. IMPRESSION: Developing patchy pulmonary infiltrates bilaterally, greater on the left, likely pneumonia. Cardiac enlargement. Electronically Signed   By: Burman Nieves M.D.   On: 05/21/2023 21:05     LOS: 1 day   Lanae Boast, MD [facility]  05/22/2023, 11:30 AM

## 2023-05-22 NOTE — Patient Outreach (Signed)
Care Coordination   05/22/2023 Name: [name] MRN: [id number] DOB: 1934/08/06   Care Coordination Outreach Attempts:  An unsuccessful telephone outreach was attempted for a scheduled appointment today. Son in law answered to confirm patient hospitalized on 05/21/23 Documented diagnosis of pneumonia  Follow Up Plan:  Additional outreach attempts will be made to offer the patient care coordination information and services.  Will update VBCI hospital liaison of admission to follow and re refer pending discharge disposition  Encounter Outcome:  Patient Request to Call Back   Care Coordination Interventions:  Yes, provided    Trace Cederberg L. Noelle Penner, RN, BSN, [facility]  VBCI Care Management Coordinator  [phone]  Fax: [phone]

## 2023-05-22 NOTE — TOC Initial Note (Signed)
Transition of Care [facility]) - Initial/Assessment Note    Patient Details  Name: [name] MRN: [id number] Date of Birth: February 14, 1934  Transition of [facility]) CM/SW Contact:    Erin Sons, LCSW Phone Number: 05/22/2023, 2:42 PM  Clinical Narrative:                  Pt is high risk for readmission. Pt well known to TOC. Pt recently assessed on 9/16. Pt lives alone but her daughter stays with her at night. Pt is independent in completing her ADLs and able to drive to appointments. Pt has been wearing O2 at home.  Pt refused HH during her last admission. TOC will follow for potential DC needs.    Expected Discharge Plan: Home/Self Care Barriers to Discharge: Continued Medical Work up   Patient Goals and CMS Choice            Expected Discharge Plan and Services                                              Prior Living Arrangements/Services                       Activities of Daily Living   ADL Screening (condition at time of admission) Independently performs ADLs?: Yes (appropriate for developmental age) Is the patient deaf or have difficulty hearing?: No Does the patient have difficulty seeing, even when wearing glasses/contacts?: No Does the patient have difficulty concentrating, remembering, or making decisions?: No  Permission Sought/Granted                  Emotional Assessment              Admission diagnosis:  CAP (community acquired pneumonia) [J18.9] AKI (acute kidney injury) (HCC) [N17.9] Community acquired pneumonia, unspecified laterality [J18.9] Patient Active Problem List   Diagnosis Date Noted   Leukocytosis 05/22/2023   Hypoalbuminemia due to protein-calorie malnutrition (HCC) 05/22/2023   CAP (community acquired pneumonia) 05/21/2023   Acute on chronic heart failure with preserved ejection fraction (HFpEF) (HCC) 05/06/2023   Acute on chronic respiratory failure with hypoxia (HCC) 05/06/2023   Acute heart failure  (HCC) 05/03/2023   Acute on chronic diastolic CHF (congestive heart failure) (HCC) 04/02/2023   Cellulitis of left lower extremity 01/27/2023   Cellulitis of right lower extremity 01/27/2023   Osteopenia due to cancer therapy 10/07/2022   Epigastric burning sensation 09/18/2022   Heart failure due to valvular disease (HCC) 09/07/2022   RSV (respiratory syncytial virus pneumonia) 08/27/2022   Acute on chronic heart failure with preserved ejection fraction (HCC) 08/24/2022   GERD without esophagitis 06/17/2022   Elevated d-dimer 06/17/2022   Anemia due to chronic blood loss 10/01/2021   Duodenal ulcer 10/01/2021   Loss of weight    Early satiety    Anemia of chronic disease 08/31/2021   Acute-on-chronic kidney injury (HCC) 08/31/2021   Dry cough 08/31/2021   UGI bleed 08/31/2021   Failure to thrive in adult 08/31/2021   Mitral valve insufficiency 08/20/2021   History of right breast cancer    Malnutrition of moderate degree 08/03/2021   Chronic respiratory failure with hypoxia (HCC) 08/01/2021   Acute bronchitis 08/01/2021   Decreased appetite 07/25/2021   Protein-calorie malnutrition (HCC) 07/25/2021   Skin tear of right lower leg without complication 05/14/2021  Non-intractable vomiting 04/08/2021   Edema 02/22/2021   Constipation 01/23/2021   Osteoarthritis of knee 12/13/2020   History of arthroscopy of knee 12/13/2020   Pain in joint of left knee 12/13/2020   Atrial tachycardia (HCC) 07/26/2019   Aortic valve sclerosis 07/26/2019   Aortic stenosis Mod to Severe 12/01/2018   Breast cancer, right (HCC) 04/08/2018   Malignant neoplasm of lower-outer quadrant of right breast of female, estrogen receptor positive (HCC) 02/25/2018   Tear of lateral meniscus of knee 10/27/2017   Blood blister 11/27/2016   Vertigo 09/09/2016   Allergic rhinitis 03/17/2016   Renal vascular disease 10/31/2015   Nodule of right lung 10/31/2015   Asthma without status asthmaticus 10/31/2015    Essential hypertension, malignant    Hypothyroidism 10/15/2015   Pancytopenia (HCC) 10/15/2015   Hyponatremia 10/14/2015   Thrombocytopenia (HCC) 06/16/2015   Right renal artery stenosis (HCC) 05/09/2015   Chronic diastolic heart failure (HCC) 04/03/2014   CKD (chronic kidney disease) stage 3, GFR 30-59 ml/min (HCC) 04/02/2014   Paroxysmal atrial fibrillation (HCC) 01/25/2014   Normocytic anemia 07/19/2013   Bilateral lower extremity edema 06/30/2013   Hypertension with fluid overload 06/30/2013   Varicose veins of bilateral lower extremities with other complications 05/03/2013   ABNORMAL STRESS ELECTROCARDIOGRAM 01/23/2010   Mixed hyperlipidemia 12/18/2009   Essential hypertension 12/18/2009   RENAL INSUFFICIENCY, CHRONIC 12/18/2009   Arthropathy 12/18/2009   PCP:  [name], DO Pharmacy:   [facility] [phone] - [place], [place] - [address] [place] [address] Phone: [phone] Fax: [phone]     Social Determinants of Health (SDOH) Social History: SDOH Screenings   Food Insecurity: No Food Insecurity (05/22/2023)  Housing: Low Risk  (05/22/2023)  Transportation Needs: No Transportation Needs (05/22/2023)  Utilities: Not At Risk (05/22/2023)  Alcohol Screen: Low Risk  (12/15/2022)  Depression (PHQ2-9): Low Risk  (05/19/2023)  Financial Resource Strain: Low Risk  (12/15/2022)  Physical Activity: Inactive (12/15/2022)  Social Connections: Moderately Isolated (12/15/2022)  Stress: No Stress Concern Present (12/15/2022)  Tobacco Use: Low Risk  (05/22/2023)   SDOH Interventions:     Readmission Risk Interventions    05/04/2023   11:02 AM 04/03/2023   11:33 AM 08/27/2022   11:36 AM  Readmission Risk Prevention Plan  Transportation Screening Complete Complete Complete  HRI or Home Care Consult  Complete   Social Work Consult for Recovery Care Planning/Counseling  Complete   Palliative Care Screening  Not Applicable   Medication  Review Oceanographer) Complete Complete Complete  HRI or Home Care Consult Complete  Complete  SW Recovery Care/Counseling Consult Complete  Complete  Palliative Care Screening Not Applicable  Not Applicable  Skilled Nursing Facility Not Applicable  Not Applicable

## 2023-05-22 NOTE — ED Notes (Signed)
Dr Thomes Dinning at bedside. Kellogg RN

## 2023-05-22 NOTE — Discharge Instructions (Signed)
Heart Failure Nutrition Therapy For The Undernourished  This nutrition therapy will help you eat more calories and protein in addition to helping your heart. These suggestions can help you if you can't eat enough, have lost weight, or need extra calories and protein in your diet. This plan focuses on: Eating more calories.  Eating more calories can give you more energy, help you gain weight, and promote wound healing. Eating more protein. Protein can help you heal, give you energy, and build and repair muscle. Ask your registered dietitian nutritionist (RDN) how much protein is the right amount for you. Eating a low-sodium diet while increasing your calorie and protein intake. This will help control buildup of fluids around your heart, stomach, lungs, and legs. Too much sodium may make your blood pressure too high and put stress on your heart. You can achieve these goals by: Eating more calories and protein. Eating less than 2,000 milligrams of sodium per day. Reading food labels to keep track of how much sodium, protein, and calories are in the foods you eat. Limiting fluid intake if your doctor has asked you to follow a fluid restriction. Reading the Food Label: How Much Sodium Is too Much? The nutrition plan for heart failure usually limits the sodium that you get from food and beverages to 2,000 milligrams per day. Salt is the main source of sodium. Read the nutrition label to find out how much sodium is in 1 serving. Foods with more than 300 milligrams of sodium per serving may not fit into a reduced-sodium meal plan. Check serving sizes on the label. If you eat more than 1 serving, you will get more sodium than the amount listed. You can find protein and calories on the Nutrition Facts label too. Eating More Protein and Calories Eat at least 6 small meals throughout the day. If you become full quickly after you begin eating, you may benefit from small, frequent meals instead of 3 large  meals. Keep high-calorie and high-protein snacks available in your car or bag for when you get hungry. Add extra calories and protein when cooking meals. Cook with unsalted butter, margarine, or oils instead of calorie-free cooking spray. Use reduced-fat (2%) milk instead of fat-free (skim) milk. Ask your RDN if whole milk is recommended for you. Add milk powder to protein shakes, cereal, or casseroles. Sprinkle unsalted nuts and seeds into your cereal, stir-fry, or salad. Add 1 tablespoon mayonnaise, sugar, or honey to foods (adds 50 to 100 calories). Add calories and protein to your fruits and vegetables. Fruits are low in calories. Add peanut butter, yogurt, or cottage cheese to add calories and protein. Buy fruit cups canned in syrup instead of water or juice. Vegetables are also low in calories. Add butter, sour cream, margarine, or oil for extra calories. Potatoes, corn, and peas are higher in calories than nonstarchy vegetables. Avocados are rich in calories and healthy fat. Eat them alone or use them as a topping or spread. Limit foods that have little to no nutrition. Eat fewer calorie-free and low-calorie foods such as applesauce, Jell-O, and diet soft drinks. These foods will take up space in your stomach and may leave little room for higher-calorie foods and beverages.  When shopping, avoid products that say "low calorie" or "low fat." Drink high-calorie beverages. There are several liquid nutrition supplements available that also have less than 300 milligrams of sodium per serving. Drink them between meals as snacks.  Make your own supplement at home with milk or ice   cream.  Ask your RDN if protein powder would also be a good addition. Milk and juice are high in calories. Limit plain tea, coffee, and water. These have no calories.  Foods Recommended Food Group Foods Recommended  Grains Whole wheat bread with less than 80 milligrams of sodium per slice  Many cold cereals,  especially shredded wheat and granola Oats or cream of wheat made with milk (add butter or margarine, sugar, dried fruit, and nuts for more calories and fat) Quinoa Wheat germ (sprinkle on soups, baked goods, or cereal)  Vegetables Fresh and frozen vegetables without added sauces, salt, or sodium Homemade soups (salt free or low sodium) with dry milk powder or cream Potatoes, corn, and peas  Fruits Canned fruits (canned in heavy syrup) Dried fruits, such as raisins, cranberries, and prunes Avocados  Dairy (Milk and Milk Products) Milk or milk powder Soy milk Yogurt, including Greek yogurt Small amounts of natural, blocked cheeses or reduced-sodium cheese (Swiss, ricotta, and fresh mozzarella are lower in sodium than others) Regular or soft cream cheese and low-sodium cottage cheese  Protein Foods (Meat, Poultry, Fish, and Beans) Fresh meat and fish Turkey bacon (check the Nutrition Facts label to make sure its not packaged in a sodium solution) Tuna canned or packed in oil Dried beans, peas, and legumes; edamame (fresh soybeans) Eggs Unsalted nuts or peanut butter  Desserts and Snacks Apple (with peanut butter); baked apple with sugar and butter             Granola bars Pudding made with reduced-fat (2%) milk or milk powder Smoothies  Custard Chocolate syrup added to milk or ice cream  Fats Tub or liquid margarine (trans fat-free) Butter (check with your doctor or RDN first) Unsaturated fat oils (canola, olive, corn, sunflower, safflower, peanut, vegetable, and soybean) Flaxseed (oil or ground)  Sodium-Free Condiments Fresh or dried herbs; pepper; vinegar; lemon juice or lime juice; salt-free seasoning mixes and marinades such as Mrs. Dash or McCormick's salt-free blend; simple salad dressings such as vinegar and oil; low-sodium ketchup. You can purchase salt-free barbecue sauce and many others on the Internet.  Ask your RDN.     Foods Not Recommended Food Group Foods Not  Recommended  Grains Breads or crackers topped with salt Cereals (hot/cold) with more than 300 milligrams sodium per serving Biscuits, cornbread, and other "quick" breads prepared with baking soda Prepackaged bread crumbs Self-rising flours  Vegetables Canned vegetables (unless they are salt free or low sodium) Frozen vegetables with high-sodium seasoning and sauces Sauerkraut and pickled vegetables Canned or dried soups (unless they are salt free or low  sodium) French fries and onion rings Broth-based soups  Fruits Dried fruits preserved with sodium-containing additives  Dairy (Milk and Milk Products) Buttermilk Processed cheeses such as Cheese Wiz, Velveeta, and Queso Feta cheese Shredded cheese (has more sodium than blocked cheeses) "Singles" cheese slices and string cheese  Protein Foods (Meat, Poultry, Fish, and Beans) Cured meats (bacon, ham, sausage, pepperoni, and hot dogs)  Canned meats (chili, Vienna sausage, sardines, and Spam) Smoked fish and meats Frozen meals with more than 600 milligrams of sodium Egg Beaters   Fats Salted butter or margarine  Condiments Salt, sea salt, kosher salt, onion salt, and garlic salt Seasoning mixes containing salt such as Lemon Pepper or Lawry's Bouillon cubes Catsup or ketchup Barbecue sauce and Worcestershire sauce Soy sauce Salsa, pickles, olives, relish Salad dressings: ranch, blue cheese, Italian, and French  Alcohol Check with your doctor.   Heart Failure (  Undernourished) Sample 1-Day Menu View Nutrient Info Breakfast 1 cup oatmeal made with milk 1 tablespoon wheat germ (sprinkle on oatmeal) 1 cup (240 milliliters) reduced-fat (2%) milk 2 tablespoons chocolate syrup (add to milk) 1 banana 2 tablespoons peanut butter (for banana)  Morning Snack 1/4 cup raw almonds Cranberries (add to almonds) 1 cup (240 milliliters) oral nutritional supplement  Lunch 3 ounces grilled chicken breast 1 small potato 2 ounces cheese (to add to  potato) 2 tablespoons margarine (to add to potato) Croutons (add to salad) Walnuts (add to salad) Vinegar and oil dressing (add to salad) 1 cup (240 milliliters) juice  Afternoon Snack 10 tortilla chips Guacamole (avocado, lime juice, onion, and tomatoes, and pepper) 1 cup (240 milliliters) water or juice  Evening Meal 3 ounces herb-baked fish 1 cup homemade mashed potatoes with margarine (trans fat-free) and milk 1/2 cup creamed spinach 1 cup (240 milliliters) water or juice  Evening Snack 1 cup (240 milliliters) homemade milkshake 1 slice low-sodium turkey breast 1 slice whole wheat bread 1 slice cheese Mayonnaise  Daily Sum Nutrient Unit Value  Macronutrients  Energy kcal 3461  Energy kJ 14480  Protein g 151  Total lipid (fat) g 158  Carbohydrate, by difference g 381  Fiber, total dietary g 36  Sugars, total g 163  Minerals  Calcium, Ca mg 2598  Iron, Fe mg 32  Sodium, Na mg 2530  Vitamins  Vitamin C, total ascorbic acid mg 180  Vitamin A, IU IU 9009  Vitamin D IU 575  Lipids  Fatty acids, total saturated g 43  Fatty acids, total monounsaturated g 56  Fatty acids, total polyunsaturated g 48  Cholesterol mg 252     Heart Failure (Undernourished) Vegan Sample 1-Day Menu View Nutrient Info Breakfast 1 cup oatmeal made with: 1 cup soymilk fortified with calcium, vitamin B12, and vitamin D 2 tablespoons walnuts, unsalted 1 banana 1 cup orange juice with added calcium and vitamin D  Morning Snack  cup almond butter, unsalted 1 apple  cup granola  cup soy yogurt  Lunch 1 black bean burger, low sodium 1 hamburger bun 1 cup lettuce for salad with:  cup cucumbers, sliced  cup carrots, shredded 1 tablespoon cashews, unsalted 1 tablespoon sesame seed dressing, low sodium  cup pineapple  Afternoon Snack 1 pita, whole wheat  cup hummus  cup grapes  Evening Meal 1 cup cooked rice 1 cup red beans, cooked 1 cup corn, cooked  cup cucumber slices  cup  tomato, diced  Evening Snack Smoothie made with: 1 cup soymilk fortified with calcium, vitamin B12, and vitamin D 1 scoop soy protein powder, for smoothie 1 cup strawberries, for smoothie  Daily Sum Nutrient Unit Value  Macronutrients  Energy kcal 2831  Energy kJ 11850  Protein g 121  Total lipid (fat) g 85  Carbohydrate, by difference g 428  Fiber, total dietary g 71  Sugars, total g 154  Minerals  Calcium, Ca mg 2162  Iron, Fe mg 45  Sodium, Na mg 2054  Vitamins  Vitamin C, total ascorbic acid mg 296  Vitamin A, IU IU 17869  Vitamin D IU 318  Lipids  Fatty acids, total saturated g 9  Fatty acids, total monounsaturated g 36  Fatty acids, total polyunsaturated g 30  Cholesterol mg 0     Heart Failure (Undernourished) Vegetarian (Lacto-Ovo) Sample 1-Day Menu View Nutrient Info Breakfast 1 cup oatmeal made with: 1 cup 2% milk 1 slice toast, whole wheat topped with:  cup   avocado 1 cup orange juice with added calcium and vitamin D  Morning Snack  cup almonds, unsalted 2 tablespoons dried cranberries (add to almonds) 1 cup Greek yogurt, plain  Lunch Sandwich made with: 2 slices bread, whole wheat 2 tablespoons peanut butter, unsalted 1 tablespoon jam 4 carrot sticks with: 2 tablespoons hummus 1 banana  Afternoon Snack  cup granola  cup peanuts, unsalted 1 apple  Evening Meal 1 cup black bean soup, low sodium  cup tofu, cooked  cup cooked brown rice  cup broccoli, cooked  cup carrots, cooked  cup onions, cooked 2 tablespoons peanut oil  Evening Snack 5 crackers, whole wheat, low sodium 3 dried figs 1 cup 2% milk  Daily Sum Nutrient Unit Value  Macronutrients  Energy kcal 3020  Energy kJ 12636  Protein g 117  Total lipid (fat) g 133  Carbohydrate, by difference g 375  Fiber, total dietary g 58  Sugars, total g 148  Minerals  Calcium, Ca mg 2197  Iron, Fe mg 29  Sodium, Na mg 1434  Vitamins  Vitamin C, total ascorbic acid mg 174  Vitamin A, IU  IU 20295  Vitamin D IU 317  Lipids  Fatty acids, total saturated g 27  Fatty acids, total monounsaturated g 62  Fatty acids, total polyunsaturated g 34  Cholesterol mg 63    Copyright 2020  [employer]. All rights reserved  

## 2023-05-22 NOTE — ED Notes (Signed)
Handoff given to Jeremy RN.

## 2023-05-23 ENCOUNTER — Inpatient Hospital Stay (HOSPITAL_COMMUNITY): Payer: PPO

## 2023-05-23 DIAGNOSIS — J189 Pneumonia, unspecified organism: Secondary | ICD-10-CM | POA: Diagnosis not present

## 2023-05-23 DIAGNOSIS — E440 Moderate protein-calorie malnutrition: Secondary | ICD-10-CM | POA: Insufficient documentation

## 2023-05-23 LAB — CBC
HCT: 25.1 % — ABNORMAL LOW (ref 36.0–46.0)
Hemoglobin: 7.8 g/dL — ABNORMAL LOW (ref 12.0–15.0)
MCH: 28 pg (ref 26.0–34.0)
MCHC: 31.1 g/dL (ref 30.0–36.0)
MCV: 90 fL (ref 80.0–100.0)
Platelets: 76 10*3/uL — ABNORMAL LOW (ref 150–400)
RBC: 2.79 MIL/uL — ABNORMAL LOW (ref 3.87–5.11)
RDW: 15.6 % — ABNORMAL HIGH (ref 11.5–15.5)
WBC: 6.3 10*3/uL (ref 4.0–10.5)
nRBC: 0 % (ref 0.0–0.2)

## 2023-05-23 LAB — BASIC METABOLIC PANEL
Anion gap: 7 (ref 5–15)
BUN: 71 mg/dL — ABNORMAL HIGH (ref 8–23)
CO2: 33 mmol/L — ABNORMAL HIGH (ref 22–32)
Calcium: 7.8 mg/dL — ABNORMAL LOW (ref 8.9–10.3)
Chloride: 94 mmol/L — ABNORMAL LOW (ref 98–111)
Creatinine, Ser: 2.11 mg/dL — ABNORMAL HIGH (ref 0.44–1.00)
GFR, Estimated: 22 mL/min — ABNORMAL LOW (ref >60)
Glucose, Bld: 106 mg/dL — ABNORMAL HIGH (ref 70–99)
Potassium: 3.9 mmol/L (ref 3.5–5.1)
Sodium: 134 mmol/L — ABNORMAL LOW (ref 135–145)

## 2023-05-23 LAB — PROCALCITONIN: Procalcitonin: 6 ng/mL

## 2023-05-23 LAB — TSH: TSH: 2.973 u[IU]/mL (ref 0.350–4.500)

## 2023-05-23 LAB — BRAIN NATRIURETIC PEPTIDE: B Natriuretic Peptide: 590 pg/mL — ABNORMAL HIGH (ref 0.0–100.0)

## 2023-05-23 MED ORDER — HYDRALAZINE HCL 20 MG/ML IJ SOLN
10.0000 mg | Freq: Four times a day (QID) | INTRAMUSCULAR | Status: DC | PRN
  Administered 2023-05-23 – 2023-05-27 (×3): 10 mg via INTRAVENOUS
  Filled 2023-05-23 (×3): qty 1

## 2023-05-23 MED ORDER — FUROSEMIDE 10 MG/ML IJ SOLN
40.0000 mg | Freq: Once | INTRAMUSCULAR | Status: AC
  Administered 2023-05-23: 40 mg via INTRAVENOUS
  Filled 2023-05-23: qty 4

## 2023-05-23 MED ORDER — LIDOCAINE 5 % EX PTCH
1.0000 | MEDICATED_PATCH | CUTANEOUS | Status: DC
  Administered 2023-05-23: 1 via TRANSDERMAL
  Filled 2023-05-23 (×4): qty 1

## 2023-05-23 NOTE — Progress Notes (Signed)
   05/23/23 2007  Assess: MEWS Score  Temp 98.4 F (36.9 C)  BP (!) 202/68  Pulse Rate 82  Resp (!) 28  Level of Consciousness Alert  SpO2 92 %  O2 Device Nasal Cannula  O2 Flow Rate (L/min) 3 L/min  Assess: MEWS Score  MEWS Temp 0  MEWS Systolic 2  MEWS Pulse 0  MEWS RR 2  MEWS LOC 0  MEWS Score 4  MEWS Score Color Red  Assess: if the MEWS score is Yellow or Red  Were vital signs accurate and taken at a resting state? Yes  Does the patient meet 2 or more of the SIRS criteria? No  MEWS guidelines implemented  Yes, red  Treat  MEWS Interventions Considered administering scheduled or prn medications/treatments as ordered  Take Vital Signs  Increase Vital Sign Frequency  Red: Q1hr x2, continue Q4hrs until patient remains green for 12hrs  Escalate  MEWS: Escalate Red: Discuss with charge nurse and notify provider. Consider notifying RRT. If remains red for 2 hours consider need for higher level of care  Notify: Charge Nurse/RN  Name of Charge Nurse/RN Notified Bree, RN  Provider Notification  Provider Name/Title Adefeso, DO  Assess: SIRS CRITERIA  SIRS Temperature  0  SIRS Pulse 0  SIRS Respirations  1  SIRS WBC 0  SIRS Score Sum  1

## 2023-05-23 NOTE — Progress Notes (Signed)
   05/23/23 2158  Assess: MEWS Score  Pulse Rate 88  SpO2 94 %  O2 Device Nasal Cannula  O2 Flow Rate (L/min) 3 L/min  Assess: MEWS Score  MEWS Temp 0  MEWS Systolic 0  MEWS Pulse 0  MEWS RR 2  MEWS LOC 0  MEWS Score 2  MEWS Score Color Yellow  Assess: if the MEWS score is Yellow or Red  Were vital signs accurate and taken at a resting state? Yes  Does the patient meet 2 or more of the SIRS criteria? No  MEWS guidelines implemented  Yes, yellow  Treat  MEWS Interventions Considered administering scheduled or prn medications/treatments as ordered  Take Vital Signs  Increase Vital Sign Frequency  Yellow: Q2hr x1, continue Q4hrs until patient remains green for 12hrs  Escalate  MEWS: Escalate Yellow: Discuss with charge nurse and consider notifying provider and/or RRT  Notify: Charge Nurse/RN  Name of Charge Nurse/RN Notified Bree, RN  Assess: SIRS CRITERIA  SIRS Temperature  0  SIRS Pulse 0  SIRS Respirations  1  SIRS WBC 0  SIRS Score Sum  1

## 2023-05-23 NOTE — Progress Notes (Signed)
   05/23/23 2208  Assess: MEWS Score  Temp 98.3 F (36.8 C)  BP (!) 170/62  MAP (mmHg) 94  Pulse Rate 87  Resp (!) 26  Level of Consciousness Alert  SpO2 96 %  O2 Device Nasal Cannula  Assess: MEWS Score  MEWS Temp 0  MEWS Systolic 0  MEWS Pulse 0  MEWS RR 2  MEWS LOC 0  MEWS Score 2  MEWS Score Color Yellow  Assess: if the MEWS score is Yellow or Red  Were vital signs accurate and taken at a resting state? Yes  Does the patient meet 2 or more of the SIRS criteria? No  MEWS guidelines implemented  Yes, yellow  Treat  MEWS Interventions Considered administering scheduled or prn medications/treatments as ordered  Take Vital Signs  Increase Vital Sign Frequency  Yellow: Q2hr x1, continue Q4hrs until patient remains green for 12hrs  Escalate  MEWS: Escalate Yellow: Discuss with charge nurse and consider notifying provider and/or RRT  Notify: Charge Nurse/RN  Name of Charge Nurse/RN Notified Bree, RN  Assess: SIRS CRITERIA  SIRS Temperature  0  SIRS Pulse 0  SIRS Respirations  1  SIRS WBC 0  SIRS Score Sum  1

## 2023-05-23 NOTE — Plan of Care (Signed)
°  Problem: Activity: °Goal: Ability to tolerate increased activity will improve °Outcome: Progressing °  °Problem: Respiratory: °Goal: Ability to maintain adequate ventilation will improve °Outcome: Progressing °Goal: Ability to maintain a clear airway will improve °Outcome: Progressing °  °

## 2023-05-23 NOTE — Plan of Care (Signed)
  Problem: Activity: Goal: Ability to tolerate increased activity will improve Outcome: Progressing   Problem: Clinical Measurements: Goal: Ability to maintain a body temperature in the normal range will improve Outcome: Progressing   Problem: Respiratory: Goal: Ability to maintain adequate ventilation will improve Outcome: Progressing Goal: Ability to maintain a clear airway will improve Outcome: Progressing   Problem: Education: Goal: Knowledge of General Education information will improve Description: Including pain rating scale, medication(s)/side effects and non-pharmacologic comfort measures Outcome: Progressing   Problem: Health Behavior/Discharge Planning: Goal: Ability to manage health-related needs will improve Outcome: Progressing   Problem: Clinical Measurements: Goal: Ability to maintain clinical measurements within normal limits will improve Outcome: Progressing Goal: Will remain free from infection Outcome: Progressing Goal: Diagnostic test results will improve Outcome: Progressing Goal: Respiratory complications will improve Outcome: Progressing Goal: Cardiovascular complication will be avoided Outcome: Progressing   Problem: Activity: Goal: Risk for activity intolerance will decrease Outcome: Progressing   Problem: Nutrition: Goal: Adequate nutrition will be maintained Outcome: Progressing   Problem: Coping: Goal: Level of anxiety will decrease Outcome: Progressing   Problem: Elimination: Goal: Will not experience complications related to bowel motility Outcome: Progressing Goal: Will not experience complications related to urinary retention Outcome: Progressing   Problem: Pain Managment: Goal: General experience of comfort will improve Outcome: Progressing   Problem: Safety: Goal: Ability to remain free from injury will improve Outcome: Progressing   Problem: Skin Integrity: Goal: Risk for impaired skin integrity will decrease Outcome:  Progressing   Problem: Activity: Goal: Ability to tolerate increased activity will improve Outcome: Progressing   Problem: Clinical Measurements: Goal: Ability to maintain a body temperature in the normal range will improve Outcome: Progressing   Problem: Respiratory: Goal: Ability to maintain adequate ventilation will improve Outcome: Progressing Goal: Ability to maintain a clear airway will improve Outcome: Progressing

## 2023-05-23 NOTE — Progress Notes (Signed)
PROGRESS NOTE [name]  WUJ:[id number] DOB: [birth year] DOA: 05/21/2023 PCP: [name], DO  Brief Narrative/Hospital Course: 89 y.o.f w/ HTN,HLD,atrial fibrillation, moderate to severe aortic stenosis,CKD stage IIIb b/l creat ~1.5-1.8 , hypothyroidism, chronic respiratory failure on 2 LPM of oxygen 24/7 presented w/ 2-day history of shortness of breath, subjective fever  and having a reddish-brown streak in the sputum once 1 day PTA and pain across her chest which worsens with deep breath. In ED:BP was 142/50, and other vitals stable, O2 sat was 92% on 2 LPM of oxygen.  Labs showed leukocytosis normocytic anemia thrombocytopenia normal lactic acid and troponin, influenza A, B, SARS coronavirus 2, RSV was negative.Blood culture sent. CXR>> Developing patchy pulmonary infiltrates bilaterally greater on the left, likely pneumonia. Started on ceftriaxone and azithromycin, breathing treatment and admitted for further management.    Subjective: Send seen and examined She is trying to have breakfast daughter at the bedside-end states she does not look too well today breathing little bit harder, short of breath Remain on 2.5 L nasal cannula home setting Overnight she remained afebrile BP has been stable in 120s to 130s although diastatic low in 40s to 50s Labs shows leukocytosis has resolved hemoglobin drifting 7.8 g platelets 76K-it seems all cell lines decreased could be dilutional Lab with creatinine further improving 2.1  Assessment and Plan: Principal Problem:   CAP (community acquired pneumonia) Active Problems:   Mixed hyperlipidemia   Essential hypertension   History of right breast cancer   Anemia of chronic disease   Acute-on-chronic kidney injury (HCC)   GERD without esophagitis   Acute on chronic respiratory failure with hypoxia (HCC)   Leukocytosis   Hypoalbuminemia due to protein-calorie malnutrition (HCC)   Protein-calorie malnutrition, moderate (HCC)    Community-acquired pneumonia Met sepsis criteria on admission-with leukocytosis/tachypnea and CAP. Acute on chronic respiratory failure with hypoxia and hypercapnia- o n2l [facility] at baseline Chronic metabolic alkalosis in the setting of hypercapnea: Presented with leukocytosis, shortness of breath subjective fever.  BP soft but lactic acid was normal.  Leukocytosis has resolved, blood culture no growth to date, strep pneumonia antigen negative, Legionella pending. Continue ceftriaxone azithromycin, incentive expiratory, mobilize continue PT OT  Procalcitonin obtained this morning is elevated at 6.0.Continue supplemental oxygen oxygen to keep spo2> 93% Recent Labs  Lab 05/20/23 1303 05/21/23 2129 05/21/23 2133 05/22/23 0523 05/23/23 0614  WBC 15.5* 16.2*  --  13.1* 6.3  LATICACIDVEN  --   --  0.6  --   --   PROCALCITON  --   --   --   --  6.00   Pancytopenia with anemia and thrombocytopenia chronic Anemia of chronic disease ?MDS: Monitor platelet count.  Her WBC count mostly in 2-3 K range and also has chronic anemia and thrombocytopenia, monitor closely. Hemoglobin drifting 7.8 g platelets 76K-it seems all cell lines decreased could be dilutional.Patient declined bone marrow biopsy in the past but had considered redoing it, following up with oncology. Patient receives Procrit every 2 weeks history of right breast cancer Recent Labs  Lab 05/21/23 2129 05/22/23 0523 05/23/23 0614  HGB 9.2* 8.4* 7.8*  HCT 29.9* 27.3* 25.1*  WBC 16.2* 13.1* 6.3  PLT 89* 84* 76*     AKI on CKD 4: C/l creatinine 1.5-1 .8.Noted bump 2s.  Creatinine slowly improving -will hold off on further IV fluids given her shortness of breath CHF history.  Encourage oral hydration.Avoid nephrotoxic medication, renally dose meds, monitor urine output.   Recent Labs  05/04/23 0455 05/05/23 0536 05/06/23 0448 05/07/23 0445 05/08/23 0458 05/18/23 0930 05/18/23 0933 05/21/23 2129 05/22/23 0523 05/23/23 0614   BUN 26* 30* 30* 27* 33* 43* 42* 62* 66* 71*  CREATININE 1.66* 1.69* 1.74* 1.50* 1.54* 1.85* 1.84* 2.17* 2.34* 2.11*  CO2 35* 36* 34* 33* 33* 35* 36* 32 33* 33*  K 3.6 4.0 3.7 3.8 4.0 4.2 4.0 3.9 3.9 3.9     Essential hypertension: BP was borderline.  Now overall stable although diastolic blood pressure remains low, will hold home bisoprolol and hydralazine.  Holding off on IV fluids unless hypotension.  Chronic CHF with preserved EF Moderate to severe aortic stenosis: Recently was admitted for CHF.  On torsemide at home.  Will follow-up further IV Lasix as blood pressure is fairly stable, monitor kidney function closely, chest x-ray repeated and has worsening infiltrates likely combination of CHF and pneumonia, BNP also elevated will dose Lasix 40 mg x 1 and reassess.  Net IO Since Admission: 1,096.56 mL [05/23/23 1013]   HLD: continue statin    Hypothyroidism: Continue Synthroid.check tsh in am.   GERD: Continue Protonix   History of right breast cancer: She seems to be in remission,Continue letrozole  Severe malnutrition Low BMI Hypoalbuminemia 2.8: Failure to thrive Nutrition Problem: Severe Malnutrition Etiology: chronic illness (CKD, asthma) Signs/Symptoms: severe muscle depletion, severe fat depletion Interventions: Ensure Enlive (each supplement provides 350kcal and 20 grams of protein), Magic cup, MVI  Goals of care: Patient is DNR, she would not want any aggressive measures would not want dialysis, currently remains very sick patient's daughter at the bedside and aware, we discussed in length, we will try to treat what is treatable could try Lasix if has fluid overload symptoms and patient still remains short of breath pending chest x-ray.  DVT prophylaxis: SCDs Start: 05/22/23 0100 Code Status:   Code Status: Limited: Do not attempt resuscitation (DNR) -DNR-LIMITED -Do Not Intubate/DNI  Family Communication: plan of care discussed with patient and her daughter at  bedside.  Patient status is: Inpatient because of pneumonia Level of care: Med-Surg   Dispo: The patient is from: Home w/ daughter            Anticipated disposition: TBD Objective: Vitals last 24 hrs: Vitals:   05/22/23 1810 05/22/23 2025 05/23/23 0205 05/23/23 0452  BP: (!) 133/45 (!) 142/56 (!) 123/48 (!) 134/46  Pulse: 70 69 71 69  Resp: 20 19 (!) 22 16  Temp: 97.8 F (36.6 C) 98.2 F (36.8 C) 98.2 F (36.8 C) 98.4 F (36.9 C)  TempSrc: Oral Oral Oral   SpO2: 96% 94% 91% 98%  Weight:       Weight change:   Physical Examination: General exam: alert awake, appears short of breath tachypneic, ill-appearing and frail HEENT:Oral mucosa moist, Ear/Nose WNL grossly Respiratory system: Bilaterally crackles present,no use of accessory muscle Cardiovascular system: S1 & S2 +, No JVD.  Systolic murmur present anteriorly Gastrointestinal system: Abdomen soft,NT,ND, BS+ Nervous System: Alert, awake, moving all extremities,and following commands. Extremities: LE edema minimal in ankle Skin: No rashes,no icterus. MSK: Normal muscle bulk,tone, power   Medications reviewed:  Scheduled Meds:  atorvastatin  10 mg Oral Daily   dextromethorphan-guaiFENesin  1 tablet Oral BID   feeding supplement  237 mL Oral TID BM   ferrous sulfate  325 mg Oral Q breakfast   letrozole  2.5 mg Oral Daily   levothyroxine  75 mcg Oral Q0600   multivitamin with minerals  1 tablet Oral Daily  pantoprazole  80 mg Oral Daily   Continuous Infusions:  sodium chloride 50 mL/hr at 05/22/23 1323   azithromycin Stopped (05/22/23 2133)   cefTRIAXone (ROCEPHIN)  IV Stopped (05/22/23 2311)      Diet Order             Diet Heart Room service appropriate? Yes; Fluid consistency: Thin  Diet effective now                  Intake/Output Summary (Last 24 hours) at 05/23/2023 0906 Last data filed at 05/23/2023 0535 Gross per 24 hour  Intake 1357.18 ml  Output 650 ml  Net 707.18 ml   Net IO Since  Admission: 856.56 mL [05/23/23 0906]  Wt Readings from Last 3 Encounters:  05/21/23 48 kg  05/19/23 48.1 kg  05/14/23 49 kg     Unresulted Labs (From admission, onward)     Start     Ordered   05/23/23 0500  Basic metabolic panel  Daily,   R      05/22/23 0842   05/23/23 0500  CBC  Daily,   R      05/22/23 0842   05/22/23 0105  Expectorated Sputum Assessment w Gram Stain, Rflx to Resp Cult  (COPD / Pneumonia / Cellulitis / Lower Extremity Wound)  Once,   R        05/22/23 0105   05/22/23 0105  Legionella Pneumophila Serogp 1 Ur Ag  (COPD / Pneumonia / Cellulitis / Lower Extremity Wound)  Once,   R        05/22/23 0105          Data Reviewed: I have personally reviewed following labs and imaging studies CBC: Recent Labs  Lab 05/20/23 1303 05/21/23 2129 05/22/23 0523 05/23/23 0614  WBC 15.5* 16.2* 13.1* 6.3  NEUTROABS  --  14.6*  --   --   HGB 10.1* 9.2* 8.4* 7.8*  HCT 31.9* 29.9* 27.3* 25.1*  MCV 88.4 88.2 89.2 90.0  PLT 110* 89* 84* 76*   Basic Metabolic Panel: Recent Labs  Lab 05/18/23 0930 05/18/23 0933 05/21/23 2129 05/22/23 0523 05/23/23 0614  NA 141 141 131* 131* 134*  K 4.2 4.0 3.9 3.9 3.9  CL 91* 92* 87* 89* 94*  CO2 35* 36* 32 33* 33*  GLUCOSE 97 97 139* 129* 106*  BUN 43* 42* 62* 66* 71*  CREATININE 1.85* 1.84* 2.17* 2.34* 2.11*  CALCIUM 9.2 9.0 8.4* 7.9* 7.8*  MG  --   --   --  2.0  --   PHOS 4.0  --   --  3.3  --    GFR: Estimated Creatinine Clearance: 14.2 mL/min (A) (by C-G formula based on SCr of 2.11 mg/dL (H)). Liver Function Tests: Recent Labs  Lab 05/18/23 0930 05/21/23 2129 05/22/23 0523  AST  --  22 20  ALT  --  14 13  ALKPHOS  --  47 45  BILITOT  --  1.2 1.1  PROT  --  7.1 5.9*  ALBUMIN 4.2 3.3* 2.8*   Recent Labs  Lab 05/21/23 2133  LATICACIDVEN 0.6    Recent Results (from the past 240 hour(s))  Resp panel by RT-PCR (RSV, Flu A&B, Covid) Anterior Nasal Swab     Status: None   Collection Time: 05/21/23  7:59 PM    Specimen: Anterior Nasal Swab  Result Value Ref Range Status   SARS Coronavirus 2 by RT PCR NEGATIVE NEGATIVE Final    Comment: (NOTE) SARS-CoV-2  target nucleic acids are NOT DETECTED.  The SARS-CoV-2 RNA is generally detectable in upper respiratory specimens during the acute phase of infection. The lowest concentration of SARS-CoV-2 viral copies this assay can detect is 138 copies/mL. A negative result does not preclude SARS-Cov-2 infection and should not be used as the sole basis for treatment or other patient management decisions. A negative result may occur with  improper specimen collection/handling, submission of specimen other than nasopharyngeal swab, presence of viral mutation(s) within the areas targeted by this assay, and inadequate number of viral copies(<138 copies/mL). A negative result must be combined with clinical observations, patient history, and epidemiological information. The expected result is Negative.  Fact Sheet for Patients:  BloggerCourse.com  Fact Sheet for Healthcare Providers:  SeriousBroker.it  This test is no t yet approved or cleared by the [place] FDA and  has been authorized for detection and/or diagnosis of SARS-CoV-2 by FDA under an Emergency Use Authorization (EUA). This EUA will remain  in effect (meaning this test can be used) for the duration of the COVID-19 declaration under Section 564(b)(1) of the Act, 21 U.S.C.section 360bbb-3(b)(1), unless the authorization is terminated  or revoked sooner.       Influenza A by PCR NEGATIVE NEGATIVE Final   Influenza B by PCR NEGATIVE NEGATIVE Final    Comment: (NOTE) The Xpert Xpress SARS-CoV-2/FLU/RSV plus assay is intended as an aid in the diagnosis of influenza from Nasopharyngeal swab specimens and should not be used as a sole basis for treatment. Nasal washings and aspirates are unacceptable for Xpert Xpress  SARS-CoV-2/FLU/RSV testing.  Fact Sheet for Patients: BloggerCourse.com  Fact Sheet for Healthcare Providers: SeriousBroker.it  This test is not yet approved or cleared by the [place] FDA and has been authorized for detection and/or diagnosis of SARS-CoV-2 by FDA under an Emergency Use Authorization (EUA). This EUA will remain in effect (meaning this test can be used) for the duration of the COVID-19 declaration under Section 564(b)(1) of the Act, 21 U.S.C. section 360bbb-3(b)(1), unless the authorization is terminated or revoked.     Resp Syncytial Virus by PCR NEGATIVE NEGATIVE Final    Comment: (NOTE) Fact Sheet for Patients: BloggerCourse.com  Fact Sheet for Healthcare Providers: SeriousBroker.it  This test is not yet approved or cleared by the [place] FDA and has been authorized for detection and/or diagnosis of SARS-CoV-2 by FDA under an Emergency Use Authorization (EUA). This EUA will remain in effect (meaning this test can be used) for the duration of the COVID-19 declaration under Section 564(b)(1) of the Act, 21 U.S.C. section 360bbb-3(b)(1), unless the authorization is terminated or revoked.  Performed at [facility], [address] [place], [place] [address]   Culture, blood (routine x 2)     Status: None (Preliminary result)   Collection Time: 05/21/23  9:29 PM   Specimen: BLOOD  Result Value Ref Range Status   Specimen Description BLOOD BLOOD LEFT ARM  Final   Special Requests   Final    BOTTLES DRAWN AEROBIC AND ANAEROBIC Blood Culture results may not be optimal due to an excessive volume of blood received in culture bottles   Culture   Final    NO GROWTH 2 DAYS Performed at [facility], [address], [place], [place] [address]    Report Status PENDING  Incomplete  Culture, blood (routine x 2)     Status: None (Preliminary result)    Collection Time: 05/21/23  9:33 PM   Specimen: BLOOD  Result Value  Ref Range Status   Specimen Description BLOOD BLOOD RIGHT ARM  Final   Special Requests   Final    BOTTLES DRAWN AEROBIC AND ANAEROBIC Blood Culture adequate volume   Culture   Final    NO GROWTH 2 DAYS Performed at [facility], [address], [place], [place] [address]    Report Status PENDING  Incomplete    Antimicrobials: Anti-infectives (From admission, onward)    Start     Dose/Rate Route Frequency Ordered Stop   05/22/23 2200  cefTRIAXone (ROCEPHIN) 1 g in sodium chloride 0.9 % 100 mL IVPB        1 g 200 mL/hr over 30 Minutes Intravenous Every 24 hours 05/22/23 0836 05/26/23 2159   05/21/23 2115  cefTRIAXone (ROCEPHIN) 1 g in sodium chloride 0.9 % 100 mL IVPB        1 g 200 mL/hr over 30 Minutes Intravenous  Once 05/21/23 2113 05/21/23 2238   05/21/23 2115  azithromycin (ZITHROMAX) 500 mg in sodium chloride 0.9 % 250 mL IVPB        500 mg 250 mL/hr over 60 Minutes Intravenous Every 24 hours 05/21/23 2113        Culture/Microbiology    Component Value Date/Time   SDES BLOOD BLOOD RIGHT ARM 05/21/2023 2133   SPECREQUEST  05/21/2023 2133    BOTTLES DRAWN AEROBIC AND ANAEROBIC Blood Culture adequate volume   CULT  05/21/2023 2133    NO GROWTH 2 DAYS Performed at [facility], [address], [place], [place] [address]    REPTSTATUS PENDING 05/21/2023 2133    Radiology Studies: DG Chest 2 View  Result Date: 05/21/2023 CLINICAL DATA:  Fever and productive cough and weakness for 2 days. Elevated white cell count yesterday. EXAM: CHEST - 2 VIEW COMPARISON:  05/05/2023 FINDINGS: Cardiac enlargement. Patchy infiltrates in the lungs bilaterally, progressing since prior study, suggesting multifocal pneumonia. Small left pleural effusion. No pneumothorax. Mediastinal contours appear intact. IMPRESSION: Developing patchy pulmonary infiltrates bilaterally, greater on the left, likely pneumonia. Cardiac enlargement.  Electronically Signed   By: Burman Nieves M.D.   On: 05/21/2023 21:05     LOS: 2 days   Lanae Boast, MD [facility]  05/23/2023, 9:06 AM

## 2023-05-24 DIAGNOSIS — J189 Pneumonia, unspecified organism: Secondary | ICD-10-CM | POA: Diagnosis not present

## 2023-05-24 LAB — CBC
HCT: 27.9 % — ABNORMAL LOW (ref 36.0–46.0)
Hemoglobin: 8.3 g/dL — ABNORMAL LOW (ref 12.0–15.0)
MCH: 26.9 pg (ref 26.0–34.0)
MCHC: 29.7 g/dL — ABNORMAL LOW (ref 30.0–36.0)
MCV: 90.6 fL (ref 80.0–100.0)
Platelets: 94 10*3/uL — ABNORMAL LOW (ref 150–400)
RBC: 3.08 MIL/uL — ABNORMAL LOW (ref 3.87–5.11)
RDW: 15.2 % (ref 11.5–15.5)
WBC: 10.5 10*3/uL (ref 4.0–10.5)
nRBC: 0 % (ref 0.0–0.2)

## 2023-05-24 LAB — BASIC METABOLIC PANEL
Anion gap: 8 (ref 5–15)
BUN: 57 mg/dL — ABNORMAL HIGH (ref 8–23)
CO2: 35 mmol/L — ABNORMAL HIGH (ref 22–32)
Calcium: 8.5 mg/dL — ABNORMAL LOW (ref 8.9–10.3)
Chloride: 93 mmol/L — ABNORMAL LOW (ref 98–111)
Creatinine, Ser: 1.52 mg/dL — ABNORMAL HIGH (ref 0.44–1.00)
GFR, Estimated: 33 mL/min — ABNORMAL LOW (ref >60)
Glucose, Bld: 137 mg/dL — ABNORMAL HIGH (ref 70–99)
Potassium: 4.1 mmol/L (ref 3.5–5.1)
Sodium: 136 mmol/L (ref 135–145)

## 2023-05-24 LAB — LEGIONELLA PNEUMOPHILA SEROGP 1 UR AG: L. pneumophila Serogp 1 Ur Ag: NEGATIVE

## 2023-05-24 MED ORDER — BISOPROLOL FUMARATE 5 MG PO TABS
5.0000 mg | ORAL_TABLET | Freq: Every day | ORAL | Status: DC
  Administered 2023-05-24 – 2023-05-27 (×4): 5 mg via ORAL
  Filled 2023-05-24 (×4): qty 1

## 2023-05-24 MED ORDER — MIRTAZAPINE 15 MG PO TABS
7.5000 mg | ORAL_TABLET | Freq: Every day | ORAL | Status: DC
  Administered 2023-05-24 – 2023-05-26 (×3): 7.5 mg via ORAL
  Filled 2023-05-24 (×4): qty 1

## 2023-05-24 MED ORDER — AZELASTINE HCL 0.1 % NA SOLN: 1.0000 | Freq: Two times a day (BID) | NASAL | Status: DC | PRN

## 2023-05-24 MED ORDER — HYDRALAZINE HCL 25 MG PO TABS
50.0000 mg | ORAL_TABLET | Freq: Two times a day (BID) | ORAL | Status: DC
  Administered 2023-05-24 – 2023-05-27 (×7): 50 mg via ORAL
  Filled 2023-05-24 (×7): qty 2

## 2023-05-24 MED ORDER — DOXYCYCLINE HYCLATE 100 MG PO TABS
100.0000 mg | ORAL_TABLET | Freq: Two times a day (BID) | ORAL | Status: AC
  Administered 2023-05-24 – 2023-05-25 (×4): 100 mg via ORAL
  Filled 2023-05-24 (×4): qty 1

## 2023-05-24 MED ORDER — FUROSEMIDE 10 MG/ML IJ SOLN
20.0000 mg | Freq: Every day | INTRAMUSCULAR | Status: DC
  Administered 2023-05-24 – 2023-05-27 (×4): 20 mg via INTRAVENOUS
  Filled 2023-05-24 (×4): qty 2

## 2023-05-24 MED ORDER — ACETAMINOPHEN 650 MG RE SUPP: 650.0000 mg | Freq: Four times a day (QID) | RECTAL | Status: DC | PRN

## 2023-05-24 MED ORDER — ACETAMINOPHEN 325 MG PO TABS
325.0000 mg | ORAL_TABLET | Freq: Four times a day (QID) | ORAL | Status: DC | PRN
  Administered 2023-05-24 – 2023-05-27 (×2): 325 mg via ORAL
  Filled 2023-05-24 (×2): qty 1

## 2023-05-24 MED ORDER — FLECAINIDE ACETATE 50 MG PO TABS
50.0000 mg | ORAL_TABLET | Freq: Two times a day (BID) | ORAL | Status: DC
  Administered 2023-05-24 – 2023-05-27 (×7): 50 mg via ORAL
  Filled 2023-05-24 (×7): qty 1

## 2023-05-24 MED ORDER — FUROSEMIDE 10 MG/ML IJ SOLN: 40.0000 mg | Freq: Every day | INTRAMUSCULAR | Status: DC

## 2023-05-24 NOTE — Plan of Care (Signed)
  Problem: Activity: Goal: Ability to tolerate increased activity will improve Outcome: Progressing   Problem: Clinical Measurements: Goal: Ability to maintain a body temperature in the normal range will improve Outcome: Progressing   Problem: Respiratory: Goal: Ability to maintain adequate ventilation will improve Outcome: Progressing Goal: Ability to maintain a clear airway will improve Outcome: Progressing   Problem: Education: Goal: Knowledge of General Education information will improve Description: Including pain rating scale, medication(s)/side effects and non-pharmacologic comfort measures Outcome: Progressing   Problem: Health Behavior/Discharge Planning: Goal: Ability to manage health-related needs will improve Outcome: Progressing   Problem: Clinical Measurements: Goal: Ability to maintain clinical measurements within normal limits will improve Outcome: Progressing Goal: Will remain free from infection Outcome: Progressing Goal: Diagnostic test results will improve Outcome: Progressing Goal: Respiratory complications will improve Outcome: Progressing Goal: Cardiovascular complication will be avoided Outcome: Progressing   Problem: Activity: Goal: Risk for activity intolerance will decrease Outcome: Progressing   Problem: Nutrition: Goal: Adequate nutrition will be maintained Outcome: Progressing   Problem: Coping: Goal: Level of anxiety will decrease Outcome: Progressing   Problem: Elimination: Goal: Will not experience complications related to bowel motility Outcome: Progressing Goal: Will not experience complications related to urinary retention Outcome: Progressing   Problem: Pain Managment: Goal: General experience of comfort will improve Outcome: Progressing   Problem: Safety: Goal: Ability to remain free from injury will improve Outcome: Progressing   Problem: Skin Integrity: Goal: Risk for impaired skin integrity will decrease Outcome:  Progressing   Problem: Activity: Goal: Ability to tolerate increased activity will improve Outcome: Progressing   Problem: Clinical Measurements: Goal: Ability to maintain a body temperature in the normal range will improve Outcome: Progressing   Problem: Respiratory: Goal: Ability to maintain adequate ventilation will improve Outcome: Progressing Goal: Ability to maintain a clear airway will improve Outcome: Progressing

## 2023-05-24 NOTE — Progress Notes (Signed)
Pt's BP has been elevated through the night. MD notified. PRN hydralazine given 2x. Pt has not been receiving at home BP medications and are not on eMAR.

## 2023-05-24 NOTE — Progress Notes (Signed)
PROGRESS NOTE TU FERENCE  GEX:[id number] DOB: Sep 12, 1933 DOA: 05/21/2023 PCP: [name], DO  Brief Narrative/Hospital Course: 89 y.o.f w/ HTN,HLD,atrial fibrillation, moderate to severe aortic stenosis,CKD stage IIIb b/l creat ~1.5-1.8 , hypothyroidism, chronic respiratory failure on 2 LPM of oxygen 24/7 presented w/ 2-day history of shortness of breath, subjective fever  and having a reddish-brown streak in the sputum once 1 day PTA and pain across her chest which worsens with deep breath. In ED:BP was 142/50, and other vitals stable, O2 sat was 92% on 2 LPM of oxygen.  Labs showed leukocytosis normocytic anemia thrombocytopenia normal lactic acid and troponin, influenza A, B, SARS coronavirus 2, RSV was negative.Blood culture sent. CXR>> Developing patchy pulmonary infiltrates bilaterally greater on the left, likely pneumonia. Started on ceftriaxone and azithromycin, breathing treatment and admitted for further management. 05/23/23> further tachypnea worsening of shortness of breath and chest x-ray with worsening aeration in the lung, given a dose of Lasix IV fluid discontinued.    Subjective: Patient seen and examined this morning Patient felt some better after Lasix yesterday Again this morning feeling weak.  Having back spasm Daughter at the bedside Overnight patient blood pressure has been running high, remains tachypneic On 3 L nasal cannula baseline Labs fortunately improving with creatinine down to 1.5 after Lasix and starting IV fluids, BNP was 590  Assessment and Plan: Principal Problem:   CAP (community acquired pneumonia) Active Problems:   Mixed hyperlipidemia   Essential hypertension   History of right breast cancer   Anemia of chronic disease   Acute-on-chronic kidney injury (HCC)   GERD without esophagitis   Acute on chronic respiratory failure with hypoxia (HCC)   Leukocytosis   Hypoalbuminemia due to protein-calorie malnutrition (HCC)   Protein-calorie  malnutrition, moderate (HCC)   Community-acquired pneumonia Sepsis-met criteria on admission-with leukocytosis/tachypnea and CAP. Acute on chronic respiratory failure with hypoxia and hypercapnia- o n2l [place] at baseline Chronic metabolic alkalosis in the setting of hypercapnea: Presented with leukocytosis, shortness of breath subjective fever. BP soft but lactic acid was normal.  Pro-Cal significantly elevated at 6, placed on empiric ceftriaxone azithromycin Leukocytosis has resolved,blood culture no growth to date, strep pneumonia antigen negative, Legionella pending. Continue dialysis as tolerated along with supplemental oxygen, PT OT.  She remains tachypneic, continue to monitor respiratory status closely Recent Labs  Lab 05/20/23 1303 05/21/23 2129 05/21/23 2133 05/22/23 0523 05/23/23 0614 05/24/23 0404  WBC 15.5* 16.2*  --  13.1* 6.3 10.5  LATICACIDVEN  --   --  0.6  --   --   --   PROCALCITON  --   --   --   --  6.00  --    Pancytopenia with anemia and thrombocytopenia chronic Anemia of chronic disease ?MDS: WBC count mostly in 2-3 K range and also has chronic anemia and thrombocytopenia Monitor CBC closely.Patient declined bone marrow biopsy in the past but had considered redoing it, following up with oncology. Patient receives Procrit every 2 weeks, HAS history of right breast cancer Recent Labs  Lab 05/22/23 0523 05/23/23 0614 05/24/23 0404  HGB 8.4* 7.8* 8.3*  HCT 27.3* 25.1* 27.9*  WBC 13.1* 6.3 10.5  PLT 84* 76* 94*     AKI on CKD 4: C/l creatinine 1.5-1 .8.peaked to 2.3, IV fluids discontinued placed on Lasix with improving creatinine. hold off on further IV fluids given her shortness of breath/CHF.monitor renal function closely while diuresing.  Avoid nephrotoxic medication, renally dose medication. Recent Labs    05/05/23  [id number] 05/06/23 0448 05/07/23 0445 05/08/23 0458 05/18/23 0930 05/18/23 0933 05/21/23 2129 05/22/23 0523 05/23/23 0614 05/24/23 0404   BUN 30* 30* 27* 33* 43* 42* 62* 66* 71* 57*  CREATININE 1.69* 1.74* 1.50* 1.54* 1.85* 1.84* 2.17* 2.34* 2.11* 1.52*  CO2 36* 34* 33* 33* 35* 36* 32 33* 33* 35*  K 4.0 3.7 3.8 4.0 4.2 4.0 3.9 3.9 3.9 4.1    PAF/atrial tachycardia:  Keep on telemetry.Resume her bisoprolol, flecainide-discussed with pharmacy. She is not on anticoagulation at baseline.    Essential hypertension: BP now poorly controlled Home meds bisoprolol and hydralazine were held due to soft blood pressure on admission we will resume,   Acute on chronic CHF with preserved EF Moderate to severe aortic stenosis: Recently was admitted for CHF.  On torsemide at home.  Chest x-ray with worsening aeration 10/6 suspects component of CHF superimposed on pneumonia, BNP was elevated 590.  Given dose of Lasix and creatinine improving.  Hold off on further IV fluids, continue IV Lasix monitor intake output Daily weight. Net IO Since Admission: 496.56 mL [05/24/23 1200]  Filed Weights   05/21/23 1954  Weight: 48 kg    HLD: continue statin    Hypothyroidism: Continue Synthroid.   GERD: Continue Protonix   History of right breast cancer: She seems to be in remission,Continue letrozole  Severe malnutrition Low BMI Hypoalbuminemia 2.8: Failure to thrive Nutrition Problem: Severe Malnutrition Etiology: chronic illness (CKD, asthma) Signs/Symptoms: severe muscle depletion, severe fat depletion Interventions: Ensure Enlive (each supplement provides 350kcal and 20 grams of protein), Magic cup, MVI  Goals of care: Patient is DNR.patient is still very sick patient and daughter understand.  Requesting palliative care consultation. She would not want any aggressive measures would not want dialysis, currently remains very sick patient's daughter at the bedside and aware  DVT prophylaxis: SCDs Start: 05/22/23 0100 Code Status:   Code Status: Limited: Do not attempt resuscitation (DNR) -DNR-LIMITED -Do Not Intubate/DNI  Family  Communication: plan of care discussed with patient and her daughter at bedside.  Patient status is: Inpatient because of pneumonia Level of care: Med-Surg   Dispo: The patient is from: Home w/ daughter            Anticipated disposition: TBD Objective: Vitals last 24 hrs: Vitals:   05/24/23 0202 05/24/23 0213 05/24/23 0510 05/24/23 0834  BP: (!) 186/69 (!) 186/62 (!) 170/55 (!) 194/66  Pulse: 87  95 85  Resp: (!) 30  (!) 28 20  Temp: 98.3 F (36.8 C)  98.7 F (37.1 C) 98.4 F (36.9 C)  TempSrc: Oral  Oral Oral  SpO2: 95%  98% 94%  Weight:       Weight change:   Physical Examination: General exam: alert awake, appears ill frail, thin  HEENT:Oral mucosa moist, Ear/Nose WNL grossly Respiratory system: Bilaterally crackles on posterior lungs , tachycpnea Cardiovascular system: S1 & S2 +, No JVD. Gastrointestinal system: Abdomen soft,NT,ND, BS+ Nervous System: Alert, awake, moving all extremities, frail Extremities: LE edema neg,distal peripheral pulses palpable and warm.  Skin: No rashes,no icterus. MSK: thin muscle bulk,tone, power   Medications reviewed:  Scheduled Meds:  atorvastatin  10 mg Oral Daily   bisoprolol  5 mg Oral Daily   dextromethorphan-guaiFENesin  1 tablet Oral BID   doxycycline  100 mg Oral Q12H   feeding supplement  237 mL Oral TID BM   ferrous sulfate  325 mg Oral Q breakfast   flecainide  50 mg Oral BID  furosemide  20 mg Intravenous Daily   hydrALAZINE  50 mg Oral BID   letrozole  2.5 mg Oral Daily   levothyroxine  75 mcg Oral Q0600   lidocaine  1 patch Transdermal Q24H   mirtazapine  7.5 mg Oral QHS   multivitamin with minerals  1 tablet Oral Daily   pantoprazole  80 mg Oral Daily   Continuous Infusions:  cefTRIAXone (ROCEPHIN)  IV 1 g (05/23/23 2109)     Diet Order             Diet Heart Room service appropriate? Yes; Fluid consistency: Thin  Diet effective now                  Intake/Output Summary (Last 24 hours) at 05/24/2023  1200 Last data filed at 05/23/2023 1600 Gross per 24 hour  Intake --  Output 600 ml  Net -600 ml   Net IO Since Admission: 496.56 mL [05/24/23 1200]  Wt Readings from Last 3 Encounters:  05/21/23 48 kg  05/19/23 48.1 kg  05/14/23 49 kg     Unresulted Labs (From admission, onward)     Start     Ordered   05/23/23 0500  Basic metabolic panel  Daily,   R      05/22/23 0842   05/23/23 0500  CBC  Daily,   R      05/22/23 0842   05/22/23 0105  Expectorated Sputum Assessment w Gram Stain, Rflx to Resp Cult  (COPD / Pneumonia / Cellulitis / Lower Extremity Wound)  Once,   R        05/22/23 0105   05/22/23 0105  Legionella Pneumophila Serogp 1 Ur Ag  (COPD / Pneumonia / Cellulitis / Lower Extremity Wound)  Once,   R        05/22/23 0105          Data Reviewed: I have personally reviewed following labs and imaging studies CBC: Recent Labs  Lab 05/20/23 1303 05/21/23 2129 05/22/23 0523 05/23/23 0614 05/24/23 0404  WBC 15.5* 16.2* 13.1* 6.3 10.5  NEUTROABS  --  14.6*  --   --   --   HGB 10.1* 9.2* 8.4* 7.8* 8.3*  HCT 31.9* 29.9* 27.3* 25.1* 27.9*  MCV 88.4 88.2 89.2 90.0 90.6  PLT 110* 89* 84* 76* 94*   Basic Metabolic Panel: Recent Labs  Lab 05/18/23 0930 05/18/23 0933 05/21/23 2129 05/22/23 0523 05/23/23 0614 05/24/23 0404  NA 141 141 131* 131* 134* 136  K 4.2 4.0 3.9 3.9 3.9 4.1  CL 91* 92* 87* 89* 94* 93*  CO2 35* 36* 32 33* 33* 35*  GLUCOSE 97 97 139* 129* 106* 137*  BUN 43* 42* 62* 66* 71* 57*  CREATININE 1.85* 1.84* 2.17* 2.34* 2.11* 1.52*  CALCIUM 9.2 9.0 8.4* 7.9* 7.8* 8.5*  MG  --   --   --  2.0  --   --   PHOS 4.0  --   --  3.3  --   --    GFR: Estimated Creatinine Clearance: 19.8 mL/min (A) (by C-G formula based on SCr of 1.52 mg/dL (H)). Liver Function Tests: Recent Labs  Lab 05/18/23 0930 05/21/23 2129 05/22/23 0523  AST  --  22 20  ALT  --  14 13  ALKPHOS  --  47 45  BILITOT  --  1.2 1.1  PROT  --  7.1 5.9*  ALBUMIN 4.2 3.3* 2.8*    Recent Labs  Lab 05/21/23 2133  05/23/23 0614  PROCALCITON  --  6.00  LATICACIDVEN 0.6  --     Recent Results (from the past 240 hour(s))  Resp panel by RT-PCR (RSV, Flu A&B, Covid) Anterior Nasal Swab     Status: None   Collection Time: 05/21/23  7:59 PM   Specimen: Anterior Nasal Swab  Result Value Ref Range Status   SARS Coronavirus 2 by RT PCR NEGATIVE NEGATIVE Final    Comment: (NOTE) SARS-CoV-2 target nucleic acids are NOT DETECTED.  The SARS-CoV-2 RNA is generally detectable in upper respiratory specimens during the acute phase of infection. The lowest concentration of SARS-CoV-2 viral copies this assay can detect is 138 copies/mL. A negative result does not preclude SARS-Cov-2 infection and should not be used as the sole basis for treatment or other patient management decisions. A negative result may occur with  improper specimen collection/handling, submission of specimen other than nasopharyngeal swab, presence of viral mutation(s) within the areas targeted by this assay, and inadequate number of viral copies(<138 copies/mL). A negative result must be combined with clinical observations, patient history, and epidemiological information. The expected result is Negative.  Fact Sheet for Patients:  BloggerCourse.com  Fact Sheet for Healthcare Providers:  SeriousBroker.it  This test is no t yet approved or cleared by the [place] FDA and  has been authorized for detection and/or diagnosis of SARS-CoV-2 by FDA under an Emergency Use Authorization (EUA). This EUA will remain  in effect (meaning this test can be used) for the duration of the COVID-19 declaration under Section 564(b)(1) of the Act, 21 U.S.C.section 360bbb-3(b)(1), unless the authorization is terminated  or revoked sooner.       Influenza A by PCR NEGATIVE NEGATIVE Final   Influenza B by PCR NEGATIVE NEGATIVE Final    Comment: (NOTE) The Xpert  Xpress SARS-CoV-2/FLU/RSV plus assay is intended as an aid in the diagnosis of influenza from Nasopharyngeal swab specimens and should not be used as a sole basis for treatment. Nasal washings and aspirates are unacceptable for Xpert Xpress SARS-CoV-2/FLU/RSV testing.  Fact Sheet for Patients: BloggerCourse.com  Fact Sheet for Healthcare Providers: SeriousBroker.it  This test is not yet approved or cleared by the [place] FDA and has been authorized for detection and/or diagnosis of SARS-CoV-2 by FDA under an Emergency Use Authorization (EUA). This EUA will remain in effect (meaning this test can be used) for the duration of the COVID-19 declaration under Section 564(b)(1) of the Act, 21 U.S.C. section 360bbb-3(b)(1), unless the authorization is terminated or revoked.     Resp Syncytial Virus by PCR NEGATIVE NEGATIVE Final    Comment: (NOTE) Fact Sheet for Patients: BloggerCourse.com  Fact Sheet for Healthcare Providers: SeriousBroker.it  This test is not yet approved or cleared by the [place] FDA and has been authorized for detection and/or diagnosis of SARS-CoV-2 by FDA under an Emergency Use Authorization (EUA). This EUA will remain in effect (meaning this test can be used) for the duration of the COVID-19 declaration under Section 564(b)(1) of the Act, 21 U.S.C. section 360bbb-3(b)(1), unless the authorization is terminated or revoked.  Performed at [facility], [address], [place], [place] [address]   Culture, blood (routine x 2)     Status: None (Preliminary result)   Collection Time: 05/21/23  9:29 PM   Specimen: BLOOD  Result Value Ref Range Status   Specimen Description BLOOD BLOOD LEFT ARM  Final   Special Requests   Final    BOTTLES DRAWN AEROBIC AND ANAEROBIC Blood  Culture results may not be optimal due to an excessive volume of blood received in  culture bottles   Culture   Final    NO GROWTH 3 DAYS Performed at [facility], [address], [place], [place] [address]    Report Status PENDING  Incomplete  Culture, blood (routine x 2)     Status: None (Preliminary result)   Collection Time: 05/21/23  9:33 PM   Specimen: BLOOD  Result Value Ref Range Status   Specimen Description BLOOD BLOOD RIGHT ARM  Final   Special Requests   Final    BOTTLES DRAWN AEROBIC AND ANAEROBIC Blood Culture adequate volume   Culture   Final    NO GROWTH 3 DAYS Performed at [facility], [address] [place], [place] [address]    Report Status PENDING  Incomplete    Antimicrobials: Anti-infectives (From admission, onward)    Start     Dose/Rate Route Frequency Ordered Stop   05/24/23 1100  doxycycline (VIBRA-TABS) tablet 100 mg        100 mg Oral Every 12 hours 05/24/23 1004 05/26/23 0959   05/22/23 2200  cefTRIAXone (ROCEPHIN) 1 g in sodium chloride 0.9 % 100 mL IVPB        1 g 200 mL/hr over 30 Minutes Intravenous Every 24 hours 05/22/23 0836 05/26/23 2159   05/21/23 2115  cefTRIAXone (ROCEPHIN) 1 g in sodium chloride 0.9 % 100 mL IVPB        1 g 200 mL/hr over 30 Minutes Intravenous  Once 05/21/23 2113 05/21/23 2238   05/21/23 2115  azithromycin (ZITHROMAX) 500 mg in sodium chloride 0.9 % 250 mL IVPB  Status:  Discontinued        500 mg 250 mL/hr over 60 Minutes Intravenous Every 24 hours 05/21/23 2113 05/24/23 1004      Culture/Microbiology    Component Value Date/Time   SDES BLOOD BLOOD RIGHT ARM 05/21/2023 2133   SPECREQUEST  05/21/2023 2133    BOTTLES DRAWN AEROBIC AND ANAEROBIC Blood Culture adequate volume   CULT  05/21/2023 2133    NO GROWTH 3 DAYS Performed at [facility], [address], [place], [place] [address]    REPTSTATUS PENDING 05/21/2023 2133    Radiology Studies: [facility] Chest Port 1 View  Result Date: 05/23/2023 CLINICAL DATA:  89 year old female with history of shortness of breath. EXAM: PORTABLE CHEST 1 VIEW  COMPARISON:  Chest x-ray 05/21/2023. FINDINGS: Lung volumes are normal. Diffuse interstitial prominence, widespread peribronchial cuffing and worsening patchy multifocal somewhat nodular appearing airspace disease. Overall, aeration has worsened, particularly throughout the right mid to lower lung. Probable trace bilateral pleural effusions. No pneumothorax. Pulmonary vasculature appears engorged. Heart size is mildly enlarged. Atherosclerotic calcifications are noted in the thoracic aorta. IMPRESSION: 1. Worsening aeration in the lungs, favored to reflect evolving multilobar bilateral bronchopneumonia, although some degree of pulmonary edema from congestive heart failure is not excluded. 2. Trace bilateral pleural effusions. 3. Mild cardiomegaly. 4. Aortic atherosclerosis. Electronically Signed   By: Trudie Reed M.D.   On: 05/23/2023 10:26     LOS: 3 days   Lanae Boast, MD [facility]  05/24/2023, 12:00 PM

## 2023-05-25 ENCOUNTER — Encounter (HOSPITAL_COMMUNITY): Payer: Self-pay | Admitting: Internal Medicine

## 2023-05-25 DIAGNOSIS — Z7189 Other specified counseling: Secondary | ICD-10-CM | POA: Diagnosis not present

## 2023-05-25 DIAGNOSIS — Z515 Encounter for palliative care: Secondary | ICD-10-CM | POA: Diagnosis not present

## 2023-05-25 DIAGNOSIS — J189 Pneumonia, unspecified organism: Secondary | ICD-10-CM | POA: Diagnosis not present

## 2023-05-25 LAB — CBC
HCT: 27.6 % — ABNORMAL LOW (ref 36.0–46.0)
Hemoglobin: 8.5 g/dL — ABNORMAL LOW (ref 12.0–15.0)
MCH: 27.4 pg (ref 26.0–34.0)
MCHC: 30.8 g/dL (ref 30.0–36.0)
MCV: 89 fL (ref 80.0–100.0)
Platelets: 105 10*3/uL — ABNORMAL LOW (ref 150–400)
RBC: 3.1 MIL/uL — ABNORMAL LOW (ref 3.87–5.11)
RDW: 15.3 % (ref 11.5–15.5)
WBC: 14.9 10*3/uL — ABNORMAL HIGH (ref 4.0–10.5)
nRBC: 0 % (ref 0.0–0.2)

## 2023-05-25 LAB — BASIC METABOLIC PANEL
Anion gap: 12 (ref 5–15)
BUN: 43 mg/dL — ABNORMAL HIGH (ref 8–23)
CO2: 33 mmol/L — ABNORMAL HIGH (ref 22–32)
Calcium: 8.6 mg/dL — ABNORMAL LOW (ref 8.9–10.3)
Chloride: 90 mmol/L — ABNORMAL LOW (ref 98–111)
Creatinine, Ser: 1.28 mg/dL — ABNORMAL HIGH (ref 0.44–1.00)
GFR, Estimated: 41 mL/min — ABNORMAL LOW (ref >60)
Glucose, Bld: 175 mg/dL — ABNORMAL HIGH (ref 70–99)
Potassium: 4.1 mmol/L (ref 3.5–5.1)
Sodium: 135 mmol/L (ref 135–145)

## 2023-05-25 NOTE — Plan of Care (Signed)
  Problem: Activity: Goal: Ability to tolerate increased activity will improve Outcome: Progressing   Problem: Clinical Measurements: Goal: Ability to maintain a body temperature in the normal range will improve Outcome: Progressing   Problem: Respiratory: Goal: Ability to maintain adequate ventilation will improve Outcome: Progressing Goal: Ability to maintain a clear airway will improve Outcome: Progressing   Problem: Education: Goal: Knowledge of General Education information will improve Description: Including pain rating scale, medication(s)/side effects and non-pharmacologic comfort measures Outcome: Progressing   Problem: Health Behavior/Discharge Planning: Goal: Ability to manage health-related needs will improve Outcome: Progressing   Problem: Clinical Measurements: Goal: Ability to maintain clinical measurements within normal limits will improve Outcome: Progressing Goal: Will remain free from infection Outcome: Progressing Goal: Diagnostic test results will improve Outcome: Progressing Goal: Respiratory complications will improve Outcome: Progressing Goal: Cardiovascular complication will be avoided Outcome: Progressing   Problem: Activity: Goal: Risk for activity intolerance will decrease Outcome: Progressing   Problem: Nutrition: Goal: Adequate nutrition will be maintained Outcome: Progressing   Problem: Coping: Goal: Level of anxiety will decrease Outcome: Progressing   Problem: Elimination: Goal: Will not experience complications related to bowel motility Outcome: Progressing Goal: Will not experience complications related to urinary retention Outcome: Progressing   Problem: Pain Managment: Goal: General experience of comfort will improve Outcome: Progressing   Problem: Safety: Goal: Ability to remain free from injury will improve Outcome: Progressing   Problem: Skin Integrity: Goal: Risk for impaired skin integrity will decrease Outcome:  Progressing   Problem: Activity: Goal: Ability to tolerate increased activity will improve Outcome: Progressing   Problem: Clinical Measurements: Goal: Ability to maintain a body temperature in the normal range will improve Outcome: Progressing   Problem: Respiratory: Goal: Ability to maintain adequate ventilation will improve Outcome: Progressing Goal: Ability to maintain a clear airway will improve Outcome: Progressing

## 2023-05-25 NOTE — Progress Notes (Signed)
Mobility Specialist Progress Note:    05/25/23 1345  Mobility  Activity Ambulated with assistance in hallway  Level of Assistance Contact guard assist, steadying assist  Assistive Device None  Distance Ambulated (ft) 170 ft  Range of Motion/Exercises Active;All extremities  Activity Response Tolerated well  Mobility Referral Yes  $Mobility charge 1 Mobility  Mobility Specialist Start Time (ACUTE ONLY) 1345  Mobility Specialist Stop Time (ACUTE ONLY) 1400  Mobility Specialist Time Calculation (min) (ACUTE ONLY) 15 min   Pt received in chair, agreeable to mobility. Required CGA to stand and ambulate with no AD. Tolerated well, SpO2 91% on 3L throughout session. Returned pt to room, left in chair. All needs met.   Lawerance Bach Mobility Specialist Please contact via Special educational needs teacher or  Rehab office at [phone]

## 2023-05-25 NOTE — Consult Note (Signed)
Consultation Note Date: 05/25/2023   Patient Name: [name]  DOB: [birth year]  MRN: [id number]  Age / Sex: 89 y.o., female  PCP: [name], DO Referring Physician: Lanae Boast, MD  Reason for Consultation: Establishing goals of care  HPI/Patient Profile: 89 y.o. female  with past medical history of HTN/HLD, A-fib, moderate to severe aortic stenosis, chronic respiratory failure on 2 L, CKD 3, hypothyroid, arthritis, osteopenia, multiple basal cell carcinomas, admitted on 05/21/2023 with community-acquired pneumonia AKI on CKD acute on chronic heart failure.   Clinical Assessment and Goals of Care: I have reviewed medical records including EPIC notes, labs and imaging, received report from RN, assessed the patient.  Mrs. [name] is sitting in the [place] chair in her room.  She greets me, making and mostly keeping eye contact.  She is alert and oriented, able to make her needs known.  There is no family at bedside at this time.  We meet at the bedside to discuss diagnosis prognosis, GOC, EOL wishes, disposition and options.  I introduced Palliative Medicine as specialized medical care for people living with serious illness. It focuses on providing relief from the symptoms and stress of a serious illness. The goal is to improve quality of life for both the patient and the family.  We discussed a brief life review of the patient.  Mrs. [name] tells me that she lives alone.  She is independent with ADLs.  Her daughter, Larita Fife, spends the night with her most nights and assists with some IADLs.  We then focused on their current illness.  Overall, Mrs. [name] seems knowledgeable about her acute and chronic health concerns.  She is able to tell me that she has been diagnosed with pneumonia.  We also talk about her heart issues and kidney issues.  She tells me that she has not been evaluated by physical therapy but her  goal would be to return home if at all possible.  The natural disease trajectory and expectations at EOL were discussed.  Advanced directives, concepts specific to code status, artifical feeding and hydration, and rehospitalization were considered and discussed.  Mrs. [name] endorses "treat the treatable, but allow a natural passing".  Palliative Care services outpatient were explained and offered.  We talked about the benefits of outpatient palliative services for further support.  I encouraged her to consider these service as.  Discussed the importance of continued conversation with family and the medical providers regarding overall plan of care and treatment options, ensuring decisions are within the context of the patient's values and GOCs. Questions and concerns were addressed.  The family was encouraged to call with questions or concerns.  PMT will continue to support holistically.  Conference with attending, bedside nursing staff, transition of care team related to patient condition, needs, goals of care, disposition.    HCPOA NEXT OF KIN -Mrs. Pozza tells me that her 2 daughters, Larita Fife and Serina Cowper, are her healthcare surrogates they are expected to work as a team.    SUMMARY OF RECOMMENDATIONS  At this point continue to treat the treatable but no CPR or intubation Time for outcomes May need short-term rehab but ultimate goal is to return home Considering outpatient palliative care   Code Status/Advance Care Planning: DNR  Symptom Management:  Per hospitalist, no additional needs at this time.  Palliative Prophylaxis:  Frequent Pain Assessment and Oral Care  Additional Recommendations (Limitations, Scope, Preferences): Continue to treat but no extraordinary measures  Psycho-social/Spiritual:  Desire for further Chaplaincy support:no Additional Recommendations: Caregiving  Support/Resources  Prognosis:  < 6 months, would not be surprising based on advanced age, decreasing  functional status, 3 hospital stays in the last 6 months.  Discharge Planning: To Be Determined      Primary Diagnoses: Present on Admission:  CAP (community acquired pneumonia)  Acute on chronic respiratory failure with hypoxia (HCC)  Anemia of chronic disease  Acute-on-chronic kidney injury (HCC)  Essential hypertension  Mixed hyperlipidemia  GERD without esophagitis   I have reviewed the medical record, interviewed the patient and family, and examined the patient. The following aspects are pertinent.  Past Medical History:  Diagnosis Date   Anemia    Arthritis    Asthma    Atrial fibrillation (HCC)    Basal cell carcinoma 02/06/2009   Left ear targus- ([facility])   Basal cell carcinoma 09/28/2008   Right back-([id number])   CKD (chronic kidney disease), stage III (HCC)    Heart murmur    Hyperlipidemia    Hypertension    Hypothyroidism    Nodule of right lung    Right upper lobe   Osteopenia due to cancer therapy 10/07/2022   Pneumonia    PONV (postoperative nausea and vomiting)    Renal insufficiency    Chronic   Renal vascular disease    Right renal artery stenosis (HCC) 05/09/2015   Squamous cell carcinoma of skin 09/07/2019   KA-Left shin-txpbx   Vertigo    Social History   Socioeconomic History   Marital status: Widowed    Spouse name: Not on file   Number of children: 2   Years of education: 63   Highest education level: Not on file  Occupational History   Occupation: Retired  Tobacco Use   Smoking status: Never   Smokeless tobacco: Never  Vaping Use   Vaping status: Never Used  Substance and Sexual Activity   Alcohol use: No   Drug use: No   Sexual activity: Not Currently  Other Topics Concern   Not on file  Social History Narrative   Lives alone, but her daughter stays there at night   Ambidextrous (writes with left hand).   No caffeine use.   Social Determinants of Health   Financial Resource Strain: Low Risk  (12/15/2022)   Overall  Financial Resource Strain (CARDIA)    Difficulty of Paying Living Expenses: Not hard at all  Food Insecurity: No Food Insecurity (05/22/2023)   Hunger Vital Sign    Worried About Running Out of Food in the Last Year: Never true    Ran Out of Food in the Last Year: Never true  Transportation Needs: No Transportation Needs (05/22/2023)   PRAPARE - Administrator, Civil Service (Medical): No    Lack of Transportation (Non-Medical): No  Physical Activity: Inactive (12/15/2022)   Exercise Vital Sign    Days of Exercise per Week: 0 days    Minutes of Exercise per Session: 0 min  Stress: No Stress Concern Present (12/15/2022)   [employer] of  Occupational Health - Occupational Stress Questionnaire    Feeling of Stress : Not at all  Social Connections: Moderately Isolated (12/15/2022)   Social Connection and Isolation Panel [NHANES]    Frequency of Communication with Friends and Family: More than three times a week    Frequency of Social Gatherings with Friends and Family: Three times a week    Attends Religious Services: More than 4 times per year    Active Member of Clubs or Organizations: No    Attends Banker Meetings: Never    Marital Status: Widowed   Family History  Problem Relation Age of Onset   Thyroid disease Mother    Stroke Father    Hypertension Brother    Lung cancer Maternal Uncle    Cancer Maternal Grandmother        Liver   Lung cancer Maternal Uncle    Hypertension Daughter    Hypercholesterolemia Daughter    Hypercholesterolemia Daughter    Scheduled Meds:  atorvastatin  10 mg Oral Daily   bisoprolol  5 mg Oral Daily   dextromethorphan-guaiFENesin  1 tablet Oral BID   doxycycline  100 mg Oral Q12H   feeding supplement  237 mL Oral TID BM   ferrous sulfate  325 mg Oral Q breakfast   flecainide  50 mg Oral BID   furosemide  20 mg Intravenous Daily   hydrALAZINE  50 mg Oral BID   letrozole  2.5 mg Oral Daily   levothyroxine  75 mcg  Oral Q0600   lidocaine  1 patch Transdermal Q24H   mirtazapine  7.5 mg Oral QHS   multivitamin with minerals  1 tablet Oral Daily   pantoprazole  80 mg Oral Daily   Continuous Infusions:  cefTRIAXone (ROCEPHIN)  IV 1 g (05/24/23 2034)   PRN Meds:.acetaminophen **OR** acetaminophen, azelastine, hydrALAZINE, ondansetron **OR** ondansetron (ZOFRAN) IV Medications Prior to Admission:  Prior to Admission medications   Medication Sig Start Date End Date Taking? Authorizing Provider  atorvastatin (LIPITOR) 20 MG tablet TAKE 1/2 TABLET BY MOUTH DAILY Patient taking differently: Take 10 mg by mouth at bedtime. 03/19/23  Yes Gottschalk, Kathie Rhodes M, DO  azelastine (ASTELIN) 0.1 % nasal spray Place 1 spray into both nostrils 2 (two) times daily. For runny nose 05/19/23  Yes Gottschalk, Ashly M, DO  bisoprolol (ZEBETA) 5 MG tablet TAKE 1 TABLET (5 MG TOTAL) BY MOUTH DAILY. 08/08/22  Yes Delynn Flavin M, DO  Cholecalciferol (VITAMIN D-3 PO) Take 1 tablet by mouth at bedtime.   Yes [provider]  Cyanocobalamin (VITAMIN B-12 PO) Take 1 tablet by mouth at bedtime.   Yes [provider]  feeding supplement (ENSURE ENLIVE / ENSURE PLUS) LIQD Take 237 mLs by mouth 2 (two) times daily between meals. Patient taking differently: Take 237 mLs by mouth daily as needed (decreased appetite). 08/28/22  Yes Tat, Onalee Hua, MD  ferrous sulfate 325 (65 FE) MG tablet Take 325 mg by mouth daily with breakfast.   Yes [provider]  flecainide (TAMBOCOR) 50 MG tablet TAKE 1 TABLET BY MOUTH TWICE A DAY 09/26/22  Yes Rollene Rotunda, MD  hydrALAZINE (APRESOLINE) 50 MG tablet Take 1 tablet (50 mg total) by mouth 2 (two) times daily. 04/04/23  Yes Lewie Chamber, MD  letrozole [facility]) 2.5 MG tablet TAKE 1 TABLET BY MOUTH EVERY DAY 01/22/23  Yes Doreatha Massed, MD  levothyroxine (SYNTHROID) 75 MCG tablet TAKE 1 TABLET BY MOUTH EVERY DAY Patient taking differently: Take 75 mcg by  mouth daily before  breakfast. 01/09/23  Yes Gottschalk, Ashly M, DO  mirtazapine (REMERON) 7.5 MG tablet TAKE 1 TABLET BY MOUTH EVERYDAY AT BEDTIME 02/27/23  Yes Harper, Kristen S, PA-C  Multiple Vitamins-Minerals (MULTIVITAMIN WOMEN 50+) TABS Take 1 tablet by mouth at bedtime.   Yes [provider]  pantoprazole (PROTONIX) 40 MG tablet TAKE 1 TABLET (40 MG TOTAL) BY MOUTH TWICE A DAY BEFORE MEALS Patient taking differently: Take 80 mg by mouth daily. 01/16/23  Yes Letta Median, PA-C  torsemide (DEMADEX) 20 MG tablet Take 2 tablets (40 mg total) by mouth daily. 05/09/23  Yes TatOnalee Hua, MD   Allergies  Allergen Reactions   Norvasc [Amlodipine] Swelling    Edema    Tape Other (See Comments)    Has to have no stick tape.   Zestril [Lisinopril] Cough   Hytrin [Terazosin] Hives and Nausea Only   Imdur [Isosorbide Nitrate] Nausea Only   Sulfonamide Derivatives Nausea And Vomiting and Rash   Review of Systems  Unable to perform ROS: Age    Physical Exam Vitals and nursing note reviewed.  Constitutional:      General: She is not in acute distress.    Appearance: She is ill-appearing.  HENT:     Mouth/Throat:     Mouth: Mucous membranes are dry.  Cardiovascular:     Rate and Rhythm: Normal rate.  Pulmonary:     Effort: Pulmonary effort is normal. No respiratory distress.  Skin:    General: Skin is warm and dry.  Neurological:     Mental Status: She is alert and oriented to person, place, and time.  Psychiatric:        Mood and Affect: Mood normal.        Behavior: Behavior normal.     Vital Signs: BP (!) 165/64 (BP Location: Left Arm)   Pulse 79   Temp 98.4 F (36.9 C) (Oral)   Resp 20   Wt 48 kg   SpO2 97%   BMI 18.16 kg/m  Pain Scale: 0-10   Pain Score: 0-No pain   SpO2: SpO2: 97 % O2 Device:SpO2: 97 % O2 Flow Rate: .O2 Flow Rate (L/min): 3 L/min  IO: Intake/output summary:  Intake/Output Summary (Last 24 hours) at 05/25/2023 1332 Last data filed at 05/24/2023  1700 Gross per 24 hour  Intake 240 ml  Output --  Net 240 ml    LBM: Last BM Date : 05/25/23 Baseline Weight: Weight: 48 kg Most recent weight: Weight: 48 kg     Palliative Assessment/Data:     Time In: 0945 Time Out: 1040 Time Total: 55 minutes Greater than 50%  of this time was spent counseling and coordinating care related to the above assessment and plan.  Signed by: Katheran Awe, NP   Please contact Palliative Medicine Team phone at [phone] for questions and concerns.  For individual provider: See Loretha Stapler

## 2023-05-25 NOTE — Progress Notes (Signed)
PROGRESS NOTE [name]  GLO:[id number] DOB: October 14, 1933 DOA: 05/21/2023 PCP: [name], DO  Brief Narrative/Hospital Course: 89 y.o.f w/ HTN,HLD,atrial fibrillation, moderate to severe aortic stenosis,CKD stage IIIb b/l creat ~1.5-1.8 , hypothyroidism, chronic respiratory failure on 2 LPM of oxygen 24/7 presented w/ 2-day history of shortness of breath, subjective fever  and having a reddish-brown streak in the sputum once 1 day PTA and pain across her chest which worsens with deep breath. In ED:BP was 142/50, and other vitals stable, O2 sat was 92% on 2 LPM of oxygen.  Labs showed leukocytosis normocytic anemia thrombocytopenia normal lactic acid and troponin, influenza A, B, SARS coronavirus 2, RSV was negative.Blood culture sent. CXR>> Developing patchy pulmonary infiltrates bilaterally greater on the left, likely pneumonia. Started on ceftriaxone and azithromycin, breathing treatment and admitted for further management. 05/23/23> further tachypnea worsening of shortness of breath and chest x-ray with worsening aeration in the lung, given a dose of Lasix IV fluid discontinued.    Subjective: Patient seen and examined this morning She is sitting up on the chair reports she could not sleep on the bed and slept in the chair but she feels much better today compared to before and less short of breath Past 24 hours she has been afebrile remains tachypneic from 20-30, blood pressure in 140s to 190s systolic, saturating well on 3 L nasal cannula  Assessment and Plan: Principal Problem:   CAP (community acquired pneumonia) Active Problems:   Mixed hyperlipidemia   Essential hypertension   History of right breast cancer   Anemia of chronic disease   Acute-on-chronic kidney injury (HCC)   GERD without esophagitis   Acute on chronic respiratory failure with hypoxia (HCC)   Leukocytosis   Hypoalbuminemia due to protein-calorie malnutrition (HCC)   Protein-calorie malnutrition, moderate  (HCC)   Community-acquired pneumonia Sepsis-met criteria on admission-with leukocytosis/tachypnea and CAP. Acute on chronic respiratory failure with hypoxia and hypercapnia- o n2l [place] at baseline Chronic metabolic alkalosis in the setting of hypercapnea: Presented with leukocytosis, Pro-Cal 6, shortness of breath subjective fever. BP soft but lactic acid was normal.  Treating for community-acquired pneumonia sepsis, vitals stable continue ceftriaxone/doxycycline. blood culture no growth to date, strep pneumonia antigen/Legionella antigen negative Continue on diuresis, PT OT supplemental oxygen.  WBC count slightly up today, monitor Recent Labs  Lab 05/21/23 2129 05/21/23 2133 05/22/23 0523 05/23/23 0614 05/24/23 0404 05/25/23 0405  WBC 16.2*  --  13.1* 6.3 10.5 14.9*  LATICACIDVEN  --  0.6  --   --   --   --   PROCALCITON  --   --   --  6.00  --   --    Pancytopenia with anemia and thrombocytopenia chronic Anemia of chronic disease ?MDS: WBC count mostly in 2-3 K range and also has chronic anemia and thrombocytopenia.  Overall remains stable no indication for transfusion. Patient declined bone marrow biopsy in the past but had considered redoing it, following up with oncology. Patient receives Procrit every 2 weeks, HAS history of right breast cancer Recent Labs  Lab 05/23/23 0614 05/24/23 0404 05/25/23 0405  HGB 7.8* 8.3* 8.5*  HCT 25.1* 27.9* 27.6*  WBC 6.3 10.5 14.9*  PLT 76* 94* 105*     AKI on CKD 4: C/l creatinine 1.5-1 .8.peaked to 2.3, IV fluids discontinued placed on Lasix -and creatinine is nicely improving at 1.8 today.Monitor renal function closely while diuresing.  Avoid nephrotoxic medication, renally dose medication. Recent Labs    05/06/23 0448 05/07/23  6073 05/08/23 0458 05/18/23 0930 05/18/23 0933 05/21/23 2129 05/22/23 0523 05/23/23 0614 05/24/23 0404 05/25/23 0405  BUN 30* 27* 33* 43* 42* 62* 66* 71* 57* 43*  CREATININE 1.74* 1.50* 1.54* 1.85*  1.84* 2.17* 2.34* 2.11* 1.52* 1.28*  CO2 34* 33* 33* 35* 36* 32 33* 33* 35* 33*  K 3.7 3.8 4.0 4.2 4.0 3.9 3.9 3.9 4.1 4.1    PAF/atrial tachycardia:  Keep on telemetry continue bisoprolol, flecainide, at baseline not on anticoagulation due to low burden of A-fib and low risk per her cardiology   Essential hypertension: BP borderline controlled continue home bisoprolol, hydralazine will be increased from twice daily to 3 times daily  if needed, cotn lasix.  Cont prn meds  Acute on chronic CHF with preserved EF Moderate to severe aortic stenosis: Recently was admitted for CHF.  On torsemide at home.  Chest x-ray with worsening aeration 10/6 suspects component of CHF superimposed on pneumonia, BNP was elevated 590.  With IV Lasix kidney function improving symptomatically feeling better.  Continue diuresis.  I discussed with Dr. Everlena Cooper from cardiology today advised to continue on current management.  Can follow-up with cardiology as outpatient.  Monitor intake output Daily weight. Net IO Since Admission: 976.56 mL [05/25/23 1026]  Filed Weights   05/21/23 1954  Weight: 48 kg    HLD: continue statin    Hypothyroidism: Continue Synthroid.   GERD: Continue Protonix   History of right breast cancer: She seems to be in remission,Continue letrozole  Severe malnutrition Low BMI Hypoalbuminemia 2.8: Failure to thrive Nutrition Problem: Severe Malnutrition Etiology: chronic illness (CKD, asthma) Signs/Symptoms: severe muscle depletion, severe fat depletion Interventions: Ensure Enlive (each supplement provides 350kcal and 20 grams of protein), Magic cup, MVI  Goals of care: Patient is DNR.patient is still very sick patient and daughter understand.  Requested palliative care consultation. She would not want any aggressive measures would not want dialysis, currently remains very sick patient's daughter at the bedside and aware  DVT prophylaxis: SCDs Start: 05/22/23 0100 Code Status:    Code Status: Limited: Do not attempt resuscitation (DNR) -DNR-LIMITED -Do Not Intubate/DNI  Family Communication: plan of care discussed with patient and her daughter at bedside.  Patient status is: Inpatient because of pneumonia Level of care: Med-Surg   Dispo: The patient is from: Home w/ daughter            Anticipated disposition: TBD Objective: Vitals last 24 hrs: Vitals:   05/24/23 0834 05/24/23 1322 05/24/23 2203 05/25/23 0454  BP: (!) 194/66 (!) 143/47 (!) 189/76 (!) 165/64  Pulse: 85 71  79  Resp: 20  (!) 24 20  Temp: 98.4 F (36.9 C) 98.3 F (36.8 C) 98.4 F (36.9 C) 98.4 F (36.9 C)  TempSrc: Oral Oral Oral Oral  SpO2: 94% 97% 92% 97%  Weight:       Weight change:   Physical Examination: General exam: alert awake, frail, not in distress, on 3 L nasal cannula.  HEENT:Oral mucosa moist, Ear/Nose WNL grossly Respiratory system: Bilaterally basal crackles,no use of accessory muscle Cardiovascular system: S1 & S2 +, No JVD. Gastrointestinal system: Abdomen soft,NT,ND, BS+ Nervous System: Alert, awake, moving all extremities,and following commands. Extremities: LE edema neg,distal peripheral pulses palpable and warm.  Skin: No rashes,no icterus. MSK: Normal muscle bulk,tone, power   Medications reviewed:  Scheduled Meds:  atorvastatin  10 mg Oral Daily   bisoprolol  5 mg Oral Daily   dextromethorphan-guaiFENesin  1 tablet Oral BID   doxycycline  100 mg Oral Q12H   feeding supplement  237 mL Oral TID BM   ferrous sulfate  325 mg Oral Q breakfast   flecainide  50 mg Oral BID   furosemide  20 mg Intravenous Daily   hydrALAZINE  50 mg Oral BID   letrozole  2.5 mg Oral Daily   levothyroxine  75 mcg Oral Q0600   lidocaine  1 patch Transdermal Q24H   mirtazapine  7.5 mg Oral QHS   multivitamin with minerals  1 tablet Oral Daily   pantoprazole  80 mg Oral Daily   Continuous Infusions:  cefTRIAXone (ROCEPHIN)  IV 1 g (05/24/23 2034)     Diet Order              Diet Heart Room service appropriate? Yes; Fluid consistency: Thin  Diet effective now                  Intake/Output Summary (Last 24 hours) at 05/25/2023 1026 Last data filed at 05/24/2023 1700 Gross per 24 hour  Intake 240 ml  Output --  Net 240 ml   Net IO Since Admission: 976.56 mL [05/25/23 1026]  Wt Readings from Last 3 Encounters:  05/21/23 48 kg  05/19/23 48.1 kg  05/14/23 49 kg     Unresulted Labs (From admission, onward)     Start     Ordered   05/23/23 0500  Basic metabolic panel  Daily,   R      05/22/23 0842   05/23/23 0500  CBC  Daily,   R      05/22/23 0842   05/22/23 0105  Expectorated Sputum Assessment w Gram Stain, Rflx to Resp Cult  (COPD / Pneumonia / Cellulitis / Lower Extremity Wound)  Once,   R        05/22/23 0105          Data Reviewed: I have personally reviewed following labs and imaging studies CBC: Recent Labs  Lab 05/21/23 2129 05/22/23 0523 05/23/23 0614 05/24/23 0404 05/25/23 0405  WBC 16.2* 13.1* 6.3 10.5 14.9*  NEUTROABS 14.6*  --   --   --   --   HGB 9.2* 8.4* 7.8* 8.3* 8.5*  HCT 29.9* 27.3* 25.1* 27.9* 27.6*  MCV 88.2 89.2 90.0 90.6 89.0  PLT 89* 84* 76* 94* 105*   Basic Metabolic Panel: Recent Labs  Lab 05/21/23 2129 05/22/23 0523 05/23/23 0614 05/24/23 0404 05/25/23 0405  NA 131* 131* 134* 136 135  K 3.9 3.9 3.9 4.1 4.1  CL 87* 89* 94* 93* 90*  CO2 32 33* 33* 35* 33*  GLUCOSE 139* 129* 106* 137* 175*  BUN 62* 66* 71* 57* 43*  CREATININE 2.17* 2.34* 2.11* 1.52* 1.28*  CALCIUM 8.4* 7.9* 7.8* 8.5* 8.6*  MG  --  2.0  --   --   --   PHOS  --  3.3  --   --   --    GFR: Estimated Creatinine Clearance: 23.5 mL/min (A) (by C-G formula based on SCr of 1.28 mg/dL (H)). Liver Function Tests: Recent Labs  Lab 05/21/23 2129 05/22/23 0523  AST 22 20  ALT 14 13  ALKPHOS 47 45  BILITOT 1.2 1.1  PROT 7.1 5.9*  ALBUMIN 3.3* 2.8*   Recent Labs  Lab 05/21/23 2133 05/23/23 0614  PROCALCITON  --  6.00   LATICACIDVEN 0.6  --     Recent Results (from the past 240 hour(s))  Resp panel by RT-PCR (RSV, Flu A&B, Covid)  Anterior Nasal Swab     Status: None   Collection Time: 05/21/23  7:59 PM   Specimen: Anterior Nasal Swab  Result Value Ref Range Status   SARS Coronavirus 2 by RT PCR NEGATIVE NEGATIVE Final    Comment: (NOTE) SARS-CoV-2 target nucleic acids are NOT DETECTED.  The SARS-CoV-2 RNA is generally detectable in upper respiratory specimens during the acute phase of infection. The lowest concentration of SARS-CoV-2 viral copies this assay can detect is 138 copies/mL. A negative result does not preclude SARS-Cov-2 infection and should not be used as the sole basis for treatment or other patient management decisions. A negative result may occur with  improper specimen collection/handling, submission of specimen other than nasopharyngeal swab, presence of viral mutation(s) within the areas targeted by this assay, and inadequate number of viral copies(<138 copies/mL). A negative result must be combined with clinical observations, patient history, and epidemiological information. The expected result is Negative.  Fact Sheet for Patients:  BloggerCourse.com  Fact Sheet for Healthcare Providers:  SeriousBroker.it  This test is no t yet approved or cleared by the [place] FDA and  has been authorized for detection and/or diagnosis of SARS-CoV-2 by FDA under an Emergency Use Authorization (EUA). This EUA will remain  in effect (meaning this test can be used) for the duration of the COVID-19 declaration under Section 564(b)(1) of the Act, 21 U.S.C.section 360bbb-3(b)(1), unless the authorization is terminated  or revoked sooner.       Influenza A by PCR NEGATIVE NEGATIVE Final   Influenza B by PCR NEGATIVE NEGATIVE Final    Comment: (NOTE) The Xpert Xpress SARS-CoV-2/FLU/RSV plus assay is intended as an aid in the diagnosis  of influenza from Nasopharyngeal swab specimens and should not be used as a sole basis for treatment. Nasal washings and aspirates are unacceptable for Xpert Xpress SARS-CoV-2/FLU/RSV testing.  Fact Sheet for Patients: BloggerCourse.com  Fact Sheet for Healthcare Providers: SeriousBroker.it  This test is not yet approved or cleared by the [place] FDA and has been authorized for detection and/or diagnosis of SARS-CoV-2 by FDA under an Emergency Use Authorization (EUA). This EUA will remain in effect (meaning this test can be used) for the duration of the COVID-19 declaration under Section 564(b)(1) of the Act, 21 U.S.C. section 360bbb-3(b)(1), unless the authorization is terminated or revoked.     Resp Syncytial Virus by PCR NEGATIVE NEGATIVE Final    Comment: (NOTE) Fact Sheet for Patients: BloggerCourse.com  Fact Sheet for Healthcare Providers: SeriousBroker.it  This test is not yet approved or cleared by the [place] FDA and has been authorized for detection and/or diagnosis of SARS-CoV-2 by FDA under an Emergency Use Authorization (EUA). This EUA will remain in effect (meaning this test can be used) for the duration of the COVID-19 declaration under Section 564(b)(1) of the Act, 21 U.S.C. section 360bbb-3(b)(1), unless the authorization is terminated or revoked.  Performed at [facility], [address] [place], [place] [address]   Culture, blood (routine x 2)     Status: None (Preliminary result)   Collection Time: 05/21/23  9:29 PM   Specimen: BLOOD  Result Value Ref Range Status   Specimen Description BLOOD BLOOD LEFT ARM  Final   Special Requests   Final    BOTTLES DRAWN AEROBIC AND ANAEROBIC Blood Culture results may not be optimal due to an excessive volume of blood received in culture bottles   Culture   Final    NO GROWTH 4 DAYS Performed at [facility]  [facility], [address] [place], [place] [address]    Report Status PENDING  Incomplete  Culture, blood (routine x 2)     Status: None (Preliminary result)   Collection Time: 05/21/23  9:33 PM   Specimen: BLOOD  Result Value Ref Range Status   Specimen Description BLOOD BLOOD RIGHT ARM  Final   Special Requests   Final    BOTTLES DRAWN AEROBIC AND ANAEROBIC Blood Culture adequate volume   Culture   Final    NO GROWTH 4 DAYS Performed at [facility], [address], [place], [place] [address]    Report Status PENDING  Incomplete    Antimicrobials: Anti-infectives (From admission, onward)    Start     Dose/Rate Route Frequency Ordered Stop   05/24/23 1100  doxycycline (VIBRA-TABS) tablet 100 mg        100 mg Oral Every 12 hours 05/24/23 1004 05/26/23 0959   05/22/23 2200  cefTRIAXone (ROCEPHIN) 1 g in sodium chloride 0.9 % 100 mL IVPB        1 g 200 mL/hr over 30 Minutes Intravenous Every 24 hours 05/22/23 0836 05/26/23 2159   05/21/23 2115  cefTRIAXone (ROCEPHIN) 1 g in sodium chloride 0.9 % 100 mL IVPB        1 g 200 mL/hr over 30 Minutes Intravenous  Once 05/21/23 2113 05/21/23 2238   05/21/23 2115  azithromycin (ZITHROMAX) 500 mg in sodium chloride 0.9 % 250 mL IVPB  Status:  Discontinued        500 mg 250 mL/hr over 60 Minutes Intravenous Every 24 hours 05/21/23 2113 05/24/23 1004      Culture/Microbiology    Component Value Date/Time   SDES BLOOD BLOOD RIGHT ARM 05/21/2023 2133   SPECREQUEST  05/21/2023 2133    BOTTLES DRAWN AEROBIC AND ANAEROBIC Blood Culture adequate volume   CULT  05/21/2023 2133    NO GROWTH 4 DAYS Performed at [facility], [address], [place], [place] [address]    REPTSTATUS PENDING 05/21/2023 2133    Radiology Studies: No results found.   LOS: 4 days   Lanae Boast, MD [facility]  05/25/2023, 10:26 AM

## 2023-05-26 DIAGNOSIS — J189 Pneumonia, unspecified organism: Secondary | ICD-10-CM | POA: Diagnosis not present

## 2023-05-26 LAB — CULTURE, BLOOD (ROUTINE X 2)
Culture: NO GROWTH
Culture: NO GROWTH
Special Requests: ADEQUATE

## 2023-05-26 LAB — BASIC METABOLIC PANEL
Anion gap: 10 (ref 5–15)
BUN: 49 mg/dL — ABNORMAL HIGH (ref 8–23)
CO2: 34 mmol/L — ABNORMAL HIGH (ref 22–32)
Calcium: 8.3 mg/dL — ABNORMAL LOW (ref 8.9–10.3)
Chloride: 92 mmol/L — ABNORMAL LOW (ref 98–111)
Creatinine, Ser: 1.39 mg/dL — ABNORMAL HIGH (ref 0.44–1.00)
GFR, Estimated: 37 mL/min — ABNORMAL LOW (ref >60)
Glucose, Bld: 115 mg/dL — ABNORMAL HIGH (ref 70–99)
Potassium: 3.8 mmol/L (ref 3.5–5.1)
Sodium: 136 mmol/L (ref 135–145)

## 2023-05-26 LAB — CBC
HCT: 25.9 % — ABNORMAL LOW (ref 36.0–46.0)
Hemoglobin: 7.7 g/dL — ABNORMAL LOW (ref 12.0–15.0)
MCH: 26.9 pg (ref 26.0–34.0)
MCHC: 29.7 g/dL — ABNORMAL LOW (ref 30.0–36.0)
MCV: 90.6 fL (ref 80.0–100.0)
Platelets: 102 10*3/uL — ABNORMAL LOW (ref 150–400)
RBC: 2.86 MIL/uL — ABNORMAL LOW (ref 3.87–5.11)
RDW: 15.3 % (ref 11.5–15.5)
WBC: 8.6 10*3/uL (ref 4.0–10.5)
nRBC: 0 % (ref 0.0–0.2)

## 2023-05-26 MED ORDER — ORAL CARE MOUTH RINSE: 15.0000 mL | OROMUCOSAL | Status: DC | PRN

## 2023-05-26 NOTE — Progress Notes (Signed)
PROGRESS NOTE [name]  FAO:[id number] DOB: 1934-05-23 DOA: 05/21/2023 PCP: [name], DO  Brief Narrative/Hospital Course: 89 y.o.f w/ HTN,HLD,atrial fibrillation, moderate to severe aortic stenosis,CKD stage IIIb b/l creat ~1.5-1.8 , hypothyroidism, chronic respiratory failure on 2 LPM of oxygen 24/7 presented w/ 2-day history of shortness of breath, subjective fever  and having a reddish-brown streak in the sputum once 1 day PTA and pain across her chest which worsens with deep breath. In ED:BP was 142/50, and other vitals stable, O2 sat was 92% on 2 LPM of oxygen.  Labs showed leukocytosis normocytic anemia thrombocytopenia normal lactic acid and troponin, influenza A, B, SARS coronavirus 2, RSV was negative.Blood culture sent. CXR>> Developing patchy pulmonary infiltrates bilaterally greater on the left, likely pneumonia. Started on ceftriaxone and azithromycin, breathing treatment and admitted for further management. 05/23/23> further tachypnea worsening of shortness of breath and chest x-ray with worsening aeration in the lung, given a dose of Lasix IV fluid discontinued.    Subjective:  Patient seen and examined this morning.   See full reports her breathing is improving, she is able to sleep on the bed On 3 L nasal cannula Labs reviewed creatinine at 1.3 WBC improved-overnight afebrile She complains of being very weak  Assessment and Plan: Principal Problem:   CAP (community acquired pneumonia) Active Problems:   Mixed hyperlipidemia   Essential hypertension   History of right breast cancer   Anemia of chronic disease   Acute-on-chronic kidney injury (HCC)   GERD without esophagitis   Acute on chronic respiratory failure with hypoxia (HCC)   Leukocytosis   Hypoalbuminemia due to protein-calorie malnutrition (HCC)   Protein-calorie malnutrition, moderate (HCC)   Community-acquired pneumonia Sepsis-met criteria on admission-with leukocytosis/tachypnea and  CAP. Acute on chronic respiratory failure with hypoxia and hypercapnia- o n2l [place] at baseline Chronic metabolic alkalosis in the setting of hypercapnea: Presented with leukocytosis, Pro-Cal 6, shortness of breath subjective fever. BP soft but lactic acid was normal.  Overall patient is clinically improving respiratory status stable, leukocytosis resolved afebrile. Continue on ceftriaxone/doxycycline. Blood culture NGTD and strep pneumonia antigen/Legionella antigen negative Continue Lasix as below.  Encourage PT OT ambulation  IS Recent Labs  Lab 05/21/23 2133 05/22/23 0523 05/23/23 0614 05/24/23 0404 05/25/23 0405 05/26/23 0434  WBC  --  13.1* 6.3 10.5 14.9* 8.6  LATICACIDVEN 0.6  --   --   --   --   --   PROCALCITON  --   --  6.00  --   --   --    Pancytopenia with anemia and thrombocytopenia chronic Anemia of chronic disease ?MDS: WBC count mostly in 2-3 K range and also has chronic anemia and thrombocytopenia.  Overall remains stable no indication for transfusion. Patient declined bone marrow biopsy in the past but had considered redoing it, following up with oncology. Patient receives Procrit every 2 weeks, HAS history of right breast cancer Recent Labs  Lab 05/24/23 0404 05/25/23 0405 05/26/23 0434  HGB 8.3* 8.5* 7.7*  HCT 27.9* 27.6* 25.9*  WBC 10.5 14.9* 8.6  PLT 94* 105* 102*     AKI on CKD 4: C/l creatinine 1.5-1 .8.peaked to 2.3> IV fluids discontinued placed on Lasix -and creatinine responded well, remains at baseline and improved, tolerating diuresis.  Monitor renal function closely while diuresing.  Avoid nephrotoxic medication, renally dose medication. Recent Labs    05/07/23 0445 05/08/23 6962 05/18/23 0930 05/18/23 9528 05/21/23 2129 05/22/23 4132 05/23/23 4401 05/24/23 0404 05/25/23 0405 05/26/23 0272  BUN 27* 33* 43* 42* 62* 66* 71* 57* 43* 49*  CREATININE 1.50* 1.54* 1.85* 1.84* 2.17* 2.34* 2.11* 1.52* 1.28* 1.39*  CO2 33* 33* 35* 36* 32 33* 33*  35* 33* 34*  K 3.8 4.0 4.2 4.0 3.9 3.9 3.9 4.1 4.1 3.8    PAF/atrial tachycardia:  Keep on telemetry continue bisoprolol, flecainide, at baseline not on anticoagulation due to low burden of A-fib and low risk per her cardiology   Essential hypertension: BP has not stabilized was as high as up to 190s.continue her home bisoprolol, hydralazine. Cont prn meds  Acute on chronic CHF with preserved EF Moderate to severe aortic stenosis: Recently was admitted for CHF.  On torsemide at home.  Chest x-ray with worsening aeration 10/6 suspects component of CHF superimposed on pneumonia, BNP was elevated 590.  With IV Lasix kidney function improving symptomatically feeling better.  Continue diuresis.  I discussed with Dr. Everlena Cooper from cardiology  10/7- dvised to continue on current management.  Can follow-up with cardiology as outpatient.  Monitor intake output Daily weight. Net IO Since Admission: 1,936.56 mL [05/26/23 1119]  Filed Weights   05/21/23 1954  Weight: 48 kg    HLD: continue statin    Hypothyroidism: Continue Synthroid.   GERD: Continue Protonix   History of right breast cancer: She seems to be in remission,Continue letrozole  Severe malnutrition Low BMI Hypoalbuminemia 2.8: Failure to thrive Nutrition Problem: Severe Malnutrition Etiology: chronic illness (CKD, asthma) Signs/Symptoms: severe muscle depletion, severe fat depletion Interventions: Ensure Enlive (each supplement provides 350kcal and 20 grams of protein), Magic cup, MVI  Goals of care: Patient is DNR.patient is still very sick patient and daughter understand.  Appreciate palliative care input.  She would not want any aggressive measures would not want dialysis, currently remains very sick patient's daughter  is aware.  DVT prophylaxis: SCDs Start: 05/22/23 0100 Code Status:   Code Status: Limited: Do not attempt resuscitation (DNR) -DNR-LIMITED -Do Not Intubate/DNI  Family Communication: plan of care  discussed with patient and her daughter not at bedside.  Will call and update her later today.  Patient status is: Inpatient because of pneumonia Level of care: Med-Surg   Dispo: The patient is from: Home w/ daughter            Anticipated disposition: Awaiting PT OT input for placement. Objective: Vitals last 24 hrs: Vitals:   05/25/23 1452 05/25/23 1500 05/25/23 2114 05/26/23 0600  BP: (!) 154/65 (!) 163/54  104/64  Pulse: 70 70 76 80  Resp:      Temp: 98.3 F (36.8 C) 98.1 F (36.7 C) 98.8 F (37.1 C) 97.9 F (36.6 C)  TempSrc: Oral Oral Oral Oral  SpO2: 98% 99% 95% 92%  Weight:       Weight change:   Physical Examination: General exam: alert awake, oriented at baseline, appears very weak and frail. HEENT:Oral mucosa moist, Ear/Nose WNL grossly Respiratory system: Bilaterally with mild basal crackles, no use of accessory muscle Cardiovascular system: S1 & S2 +, No JVD. Gastrointestinal system: Abdomen soft,NT,ND, BS+ Nervous System: Alert, awake, moving all extremities,and following commands. Extremities: LE edema  mild in toes,distal peripheral pulses palpable and warm.  Skin: No rashes,no icterus. MSK: Weak muscle power and thin.  Medications reviewed:  Scheduled Meds:  atorvastatin  10 mg Oral Daily   bisoprolol  5 mg Oral Daily   dextromethorphan-guaiFENesin  1 tablet Oral BID   feeding supplement  237 mL Oral TID BM   ferrous sulfate  325 mg Oral Q breakfast   flecainide  50 mg Oral BID   furosemide  20 mg Intravenous Daily   hydrALAZINE  50 mg Oral BID   letrozole  2.5 mg Oral Daily   levothyroxine  75 mcg Oral Q0600   lidocaine  1 patch Transdermal Q24H   mirtazapine  7.5 mg Oral QHS   multivitamin with minerals  1 tablet Oral Daily   pantoprazole  80 mg Oral Daily   Continuous Infusions:     Diet Order             Diet Heart Room service appropriate? Yes; Fluid consistency: Thin  Diet effective now                  Intake/Output Summary  (Last 24 hours) at 05/26/2023 1119 Last data filed at 05/26/2023 0900 Gross per 24 hour  Intake 720 ml  Output --  Net 720 ml   Net IO Since Admission: 1,936.56 mL [05/26/23 1119]  Wt Readings from Last 3 Encounters:  05/21/23 48 kg  05/19/23 48.1 kg  05/14/23 49 kg     Unresulted Labs (From admission, onward)     Start     Ordered   05/23/23 0500  Basic metabolic panel  Daily,   R      05/22/23 0842   05/23/23 0500  CBC  Daily,   R      05/22/23 0842   05/22/23 0105  Expectorated Sputum Assessment w Gram Stain, Rflx to Resp Cult  (COPD / Pneumonia / Cellulitis / Lower Extremity Wound)  Once,   R        05/22/23 0105          Data Reviewed: I have personally reviewed following labs and imaging studies CBC: Recent Labs  Lab 05/21/23 2129 05/22/23 0523 05/23/23 0614 05/24/23 0404 05/25/23 0405 05/26/23 0434  WBC 16.2* 13.1* 6.3 10.5 14.9* 8.6  NEUTROABS 14.6*  --   --   --   --   --   HGB 9.2* 8.4* 7.8* 8.3* 8.5* 7.7*  HCT 29.9* 27.3* 25.1* 27.9* 27.6* 25.9*  MCV 88.2 89.2 90.0 90.6 89.0 90.6  PLT 89* 84* 76* 94* 105* 102*   Basic Metabolic Panel: Recent Labs  Lab 05/22/23 0523 05/23/23 0614 05/24/23 0404 05/25/23 0405 05/26/23 0434  NA 131* 134* 136 135 136  K 3.9 3.9 4.1 4.1 3.8  CL 89* 94* 93* 90* 92*  CO2 33* 33* 35* 33* 34*  GLUCOSE 129* 106* 137* 175* 115*  BUN 66* 71* 57* 43* 49*  CREATININE 2.34* 2.11* 1.52* 1.28* 1.39*  CALCIUM 7.9* 7.8* 8.5* 8.6* 8.3*  MG 2.0  --   --   --   --   PHOS 3.3  --   --   --   --    GFR: Estimated Creatinine Clearance: 21.6 mL/min (A) (by C-G formula based on SCr of 1.39 mg/dL (H)). Liver Function Tests: Recent Labs  Lab 05/21/23 2129 05/22/23 0523  AST 22 20  ALT 14 13  ALKPHOS 47 45  BILITOT 1.2 1.1  PROT 7.1 5.9*  ALBUMIN 3.3* 2.8*   Recent Labs  Lab 05/21/23 2133 05/23/23 0614  PROCALCITON  --  6.00  LATICACIDVEN 0.6  --     Recent Results (from the past 240 hour(s))  Resp panel by RT-PCR  (RSV, Flu A&B, Covid) Anterior Nasal Swab     Status: None   Collection Time: 05/21/23  7:59 PM  Specimen: Anterior Nasal Swab  Result Value Ref Range Status   SARS Coronavirus 2 by RT PCR NEGATIVE NEGATIVE Final    Comment: (NOTE) SARS-CoV-2 target nucleic acids are NOT DETECTED.  The SARS-CoV-2 RNA is generally detectable in upper respiratory specimens during the acute phase of infection. The lowest concentration of SARS-CoV-2 viral copies this assay can detect is 138 copies/mL. A negative result does not preclude SARS-Cov-2 infection and should not be used as the sole basis for treatment or other patient management decisions. A negative result may occur with  improper specimen collection/handling, submission of specimen other than nasopharyngeal swab, presence of viral mutation(s) within the areas targeted by this assay, and inadequate number of viral copies(<138 copies/mL). A negative result must be combined with clinical observations, patient history, and epidemiological information. The expected result is Negative.  Fact Sheet for Patients:  BloggerCourse.com  Fact Sheet for Healthcare Providers:  SeriousBroker.it  This test is no t yet approved or cleared by the [place] FDA and  has been authorized for detection and/or diagnosis of SARS-CoV-2 by FDA under an Emergency Use Authorization (EUA). This EUA will remain  in effect (meaning this test can be used) for the duration of the COVID-19 declaration under Section 564(b)(1) of the Act, 21 U.S.C.section 360bbb-3(b)(1), unless the authorization is terminated  or revoked sooner.       Influenza A by PCR NEGATIVE NEGATIVE Final   Influenza B by PCR NEGATIVE NEGATIVE Final    Comment: (NOTE) The Xpert Xpress SARS-CoV-2/FLU/RSV plus assay is intended as an aid in the diagnosis of influenza from Nasopharyngeal swab specimens and should not be used as a sole basis for  treatment. Nasal washings and aspirates are unacceptable for Xpert Xpress SARS-CoV-2/FLU/RSV testing.  Fact Sheet for Patients: BloggerCourse.com  Fact Sheet for Healthcare Providers: SeriousBroker.it  This test is not yet approved or cleared by the [place] FDA and has been authorized for detection and/or diagnosis of SARS-CoV-2 by FDA under an Emergency Use Authorization (EUA). This EUA will remain in effect (meaning this test can be used) for the duration of the COVID-19 declaration under Section 564(b)(1) of the Act, 21 U.S.C. section 360bbb-3(b)(1), unless the authorization is terminated or revoked.     Resp Syncytial Virus by PCR NEGATIVE NEGATIVE Final    Comment: (NOTE) Fact Sheet for Patients: BloggerCourse.com  Fact Sheet for Healthcare Providers: SeriousBroker.it  This test is not yet approved or cleared by the [place] FDA and has been authorized for detection and/or diagnosis of SARS-CoV-2 by FDA under an Emergency Use Authorization (EUA). This EUA will remain in effect (meaning this test can be used) for the duration of the COVID-19 declaration under Section 564(b)(1) of the Act, 21 U.S.C. section 360bbb-3(b)(1), unless the authorization is terminated or revoked.  Performed at [facility], [address], [place], [place] [address]   Culture, blood (routine x 2)     Status: None   Collection Time: 05/21/23  9:29 PM   Specimen: BLOOD  Result Value Ref Range Status   Specimen Description BLOOD BLOOD LEFT ARM  Final   Special Requests   Final    BOTTLES DRAWN AEROBIC AND ANAEROBIC Blood Culture results may not be optimal due to an excessive volume of blood received in culture bottles   Culture   Final    NO GROWTH 5 DAYS Performed at [facility], [address], [place], [place] [address]    Report Status 05/26/2023 FINAL  Final  Culture, blood  (  routine x 2)     Status: None   Collection Time: 05/21/23  9:33 PM   Specimen: BLOOD  Result Value Ref Range Status   Specimen Description BLOOD BLOOD RIGHT ARM  Final   Special Requests   Final    BOTTLES DRAWN AEROBIC AND ANAEROBIC Blood Culture adequate volume   Culture   Final    NO GROWTH 5 DAYS Performed at [facility], [address] [place], [place] [address]    Report Status 05/26/2023 FINAL  Final    Antimicrobials: Anti-infectives (From admission, onward)    Start     Dose/Rate Route Frequency Ordered Stop   05/24/23 1100  doxycycline (VIBRA-TABS) tablet 100 mg        100 mg Oral Every 12 hours 05/24/23 1004 05/25/23 2111   05/22/23 2200  cefTRIAXone (ROCEPHIN) 1 g in sodium chloride 0.9 % 100 mL IVPB        1 g 200 mL/hr over 30 Minutes Intravenous Every 24 hours 05/22/23 0836 05/25/23 2131   05/21/23 2115  cefTRIAXone (ROCEPHIN) 1 g in sodium chloride 0.9 % 100 mL IVPB        1 g 200 mL/hr over 30 Minutes Intravenous  Once 05/21/23 2113 05/21/23 2238   05/21/23 2115  azithromycin (ZITHROMAX) 500 mg in sodium chloride 0.9 % 250 mL IVPB  Status:  Discontinued        500 mg 250 mL/hr over 60 Minutes Intravenous Every 24 hours 05/21/23 2113 05/24/23 1004      Culture/Microbiology    Component Value Date/Time   SDES BLOOD BLOOD RIGHT ARM 05/21/2023 2133   SPECREQUEST  05/21/2023 2133    BOTTLES DRAWN AEROBIC AND ANAEROBIC Blood Culture adequate volume   CULT  05/21/2023 2133    NO GROWTH 5 DAYS Performed at [facility], [address] [place], [place] [address]    REPTSTATUS 05/26/2023 FINAL 05/21/2023 2133    Radiology Studies: No results found.   LOS: 5 days   Lanae Boast, MD [facility]  05/26/2023, 11:19 AM

## 2023-05-26 NOTE — Progress Notes (Signed)
Mobility Specialist Progress Note:    05/26/23 0945  Mobility  Activity Ambulated with assistance in hallway;Ambulated with assistance to bathroom  Level of Assistance Contact guard assist, steadying assist  Assistive Device None  Distance Ambulated (ft) 170 ft  Range of Motion/Exercises Active;All extremities  Activity Response Tolerated well  Mobility Referral Yes  $Mobility charge 1 Mobility  Mobility Specialist Start Time (ACUTE ONLY) 0945  Mobility Specialist Stop Time (ACUTE ONLY) 1000  Mobility Specialist Time Calculation (min) (ACUTE ONLY) 15 min   Pt received sitting EOB, agreeable to mobility. Required CGA to stand and ambulate with no AD. Tolerated well, SpO2 93% on 3L throughout session. Returned pt to room, left in bed. Bed alarm on, all needs met.   Lawerance Bach Mobility Specialist Please contact via Special educational needs teacher or  Rehab office at [phone]

## 2023-05-26 NOTE — Plan of Care (Signed)
?  Problem: Activity: ?Goal: Ability to tolerate increased activity will improve ?Outcome: Progressing ?  ?Problem: Clinical Measurements: ?Goal: Ability to maintain a body temperature in the normal range will improve ?Outcome: Progressing ?  ?Problem: Respiratory: ?Goal: Ability to maintain adequate ventilation will improve ?Outcome: Progressing ?Goal: Ability to maintain a clear airway will improve ?Outcome: Progressing ?  ?Problem: Activity: ?Goal: Ability to tolerate increased activity will improve ?Outcome: Progressing ?  ?Problem: Clinical Measurements: ?Goal: Ability to maintain a body temperature in the normal range will improve ?Outcome: Progressing ?  ?Problem: Respiratory: ?Goal: Ability to maintain adequate ventilation will improve ?Outcome: Progressing ?Goal: Ability to maintain a clear airway will improve ?Outcome: Progressing ?  ?Problem: Education: ?Goal: Knowledge of General Education information will improve ?Description: Including pain rating scale, medication(s)/side effects and non-pharmacologic comfort measures ?Outcome: Progressing ?  ?Problem: Health Behavior/Discharge Planning: ?Goal: Ability to manage health-related needs will improve ?Outcome: Progressing ?  ?Problem: Clinical Measurements: ?Goal: Ability to maintain clinical measurements within normal limits will improve ?Outcome: Progressing ?Goal: Will remain free from infection ?Outcome: Progressing ?Goal: Diagnostic test results will improve ?Outcome: Progressing ?Goal: Respiratory complications will improve ?Outcome: Progressing ?Goal: Cardiovascular complication will be avoided ?Outcome: Progressing ?  ?Problem: Activity: ?Goal: Risk for activity intolerance will decrease ?Outcome: Progressing ?  ?Problem: Nutrition: ?Goal: Adequate nutrition will be maintained ?Outcome: Progressing ?  ?Problem: Coping: ?Goal: Level of anxiety will decrease ?Outcome: Progressing ?  ?Problem: Elimination: ?Goal: Will not experience complications  related to bowel motility ?Outcome: Progressing ?Goal: Will not experience complications related to urinary retention ?Outcome: Progressing ?  ?Problem: Pain Managment: ?Goal: General experience of comfort will improve ?Outcome: Progressing ?  ?Problem: Safety: ?Goal: Ability to remain free from injury will improve ?Outcome: Progressing ?  ?Problem: Skin Integrity: ?Goal: Risk for impaired skin integrity will decrease ?Outcome: Progressing ?  ?

## 2023-05-27 ENCOUNTER — Telehealth: Payer: Self-pay | Admitting: Family Medicine

## 2023-05-27 DIAGNOSIS — N1832 Chronic kidney disease, stage 3b: Secondary | ICD-10-CM

## 2023-05-27 DIAGNOSIS — J9621 Acute and chronic respiratory failure with hypoxia: Secondary | ICD-10-CM | POA: Diagnosis not present

## 2023-05-27 DIAGNOSIS — E440 Moderate protein-calorie malnutrition: Secondary | ICD-10-CM

## 2023-05-27 DIAGNOSIS — K219 Gastro-esophageal reflux disease without esophagitis: Secondary | ICD-10-CM | POA: Diagnosis not present

## 2023-05-27 DIAGNOSIS — Z0279 Encounter for issue of other medical certificate: Secondary | ICD-10-CM

## 2023-05-27 DIAGNOSIS — J189 Pneumonia, unspecified organism: Secondary | ICD-10-CM | POA: Diagnosis not present

## 2023-05-27 DIAGNOSIS — E782 Mixed hyperlipidemia: Secondary | ICD-10-CM | POA: Diagnosis not present

## 2023-05-27 LAB — CBC
HCT: 25.5 % — ABNORMAL LOW (ref 36.0–46.0)
Hemoglobin: 8 g/dL — ABNORMAL LOW (ref 12.0–15.0)
MCH: 27.8 pg (ref 26.0–34.0)
MCHC: 31.4 g/dL (ref 30.0–36.0)
MCV: 88.5 fL (ref 80.0–100.0)
Platelets: 119 10*3/uL — ABNORMAL LOW (ref 150–400)
RBC: 2.88 MIL/uL — ABNORMAL LOW (ref 3.87–5.11)
RDW: 15.7 % — ABNORMAL HIGH (ref 11.5–15.5)
WBC: 9.4 10*3/uL (ref 4.0–10.5)
nRBC: 0 % (ref 0.0–0.2)

## 2023-05-27 LAB — BASIC METABOLIC PANEL
Anion gap: 10 (ref 5–15)
BUN: 49 mg/dL — ABNORMAL HIGH (ref 8–23)
CO2: 34 mmol/L — ABNORMAL HIGH (ref 22–32)
Calcium: 8.7 mg/dL — ABNORMAL LOW (ref 8.9–10.3)
Chloride: 91 mmol/L — ABNORMAL LOW (ref 98–111)
Creatinine, Ser: 1.33 mg/dL — ABNORMAL HIGH (ref 0.44–1.00)
GFR, Estimated: 39 mL/min — ABNORMAL LOW (ref >60)
Glucose, Bld: 106 mg/dL — ABNORMAL HIGH (ref 70–99)
Potassium: 3.9 mmol/L (ref 3.5–5.1)
Sodium: 135 mmol/L (ref 135–145)

## 2023-05-27 MED ORDER — TORSEMIDE 20 MG PO TABS: 40.0000 mg | ORAL_TABLET | Freq: Every day | ORAL | Status: DC

## 2023-05-27 MED ORDER — TORSEMIDE 20 MG PO TABS: 40.0000 mg | ORAL_TABLET | Freq: Every day | ORAL | 1 refills | Status: DC

## 2023-05-27 MED ORDER — AMOXICILLIN-POT CLAVULANATE 875-125 MG PO TABS: 1.0000 | ORAL_TABLET | Freq: Two times a day (BID) | ORAL | 0 refills | Status: AC

## 2023-05-27 MED ORDER — DM-GUAIFENESIN ER 30-600 MG PO TB12: 1.0000 | ORAL_TABLET | Freq: Two times a day (BID) | ORAL | 0 refills | Status: AC

## 2023-05-27 MED ORDER — HYDRALAZINE HCL 25 MG PO TABS: 25.0000 mg | ORAL_TABLET | Freq: Four times a day (QID) | ORAL | Status: DC

## 2023-05-27 NOTE — Evaluation (Signed)
Physical Therapy Evaluation Patient Details Name: [name] MRN: [id number] DOB: 01-May-1934 Today's Date: 05/27/2023  History of Present Illness  [name] is a 89 y.o. female with medical history significant of hypertension, hyperlipidemia, atrial fibrillation, moderate to severe aortic stenosis, CKD stage IIIb, hypothyroidism, chronic respiratory failure on 2 LPM of oxygen 24/7 who presents to the emergency department due to 2-day onset of increasing shortness of breath and subjective fever, she complained of having a reddish-brown streak in the sputum once yesterday.  She also complained of pain across her chest which worsens with deep breath.   Clinical Impression  Patient functioning near baseline for functional mobility and gait. Demonstrating good return for bed mobility, transfers and ambulating in room/hallway without loss of balance.  Patient encouraged to ambulate with nursing staff/mobility techs as tolerated for length of stay.  Plan:  Patient discharged from physical therapy to care of nursing for ambulation daily as tolerated for length of stay.       If plan is discharge home, recommend the following: A little help with walking and/or transfers;A little help with bathing/dressing/bathroom;Help with stairs or ramp for entrance;Assistance with cooking/housework   Can travel by private vehicle        Equipment Recommendations None recommended by PT  Recommendations for Other Services       Functional Status Assessment Patient has had a recent decline in their functional status and/or demonstrates limited ability to make significant improvements in function in a reasonable and predictable amount of time     Precautions / Restrictions Precautions Precautions: None Restrictions Weight Bearing Restrictions: No      Mobility  Bed Mobility Overal bed mobility: Modified Independent                  Transfers Overall transfer level: Modified independent                       Ambulation/Gait Ambulation/Gait assistance: Modified independent (Device/Increase time) Gait Distance (Feet): 100 Feet Assistive device: None Gait Pattern/deviations: Decreased step length - right, Decreased step length - left, Decreased stride length Gait velocity: decreased     General Gait Details: slightly labored cadence without loss of balance, occasional leaning on side rail, did not require need for an AD, on 2 LPM with SpO2 dropping from 95% to 88%  Stairs            Wheelchair Mobility     Tilt Bed    Modified Rankin (Stroke Patients Only)       Balance Overall balance assessment: Mild deficits observed, not formally tested                                           Pertinent Vitals/Pain Pain Assessment Pain Assessment: No/denies pain    Home Living Family/patient expects to be discharged to:: Private residence Living Arrangements: Children Available Help at Discharge: Family;Available PRN/intermittently Type of Home: House Home Access: Stairs to enter Entrance Stairs-Rails: None Entrance Stairs-Number of Steps: 1   Home Layout: One level Home Equipment: Microbiologist (4 wheels);Cane - single point;Grab bars - tub/shower      Prior Function Prior Level of Function : Driving;Independent/Modified Independent             Mobility Comments: did own grocery shopping; walked community distances without assistive device ADLs Comments: Independent with  ADLs, mostly independent with iADLs but daughter assists with cleaning     Extremity/Trunk Assessment   Upper Extremity Assessment Upper Extremity Assessment: Overall WFL for tasks assessed    Lower Extremity Assessment Lower Extremity Assessment: Generalized weakness    Cervical / Trunk Assessment Cervical / Trunk Assessment: Normal  Communication   Communication Communication: No apparent difficulties  Cognition Arousal:  Alert Behavior During Therapy: WFL for tasks assessed/performed Overall Cognitive Status: Within Functional Limits for tasks assessed                                          General Comments      Exercises     Assessment/Plan    PT Assessment All further PT needs can be met in the next venue of care  PT Problem List Decreased strength;Decreased activity tolerance;Decreased balance;Decreased mobility       PT Treatment Interventions      PT Goals (Current goals can be found in the Care Plan section)  Acute Rehab PT Goals Patient Stated Goal: return home with family to assist PT Goal Formulation: With patient Time For Goal Achievement: 05/27/23 Potential to Achieve Goals: Good    Frequency       Co-evaluation               AM-PAC PT "6 Clicks" Mobility  Outcome Measure Help needed turning from your back to your side while in a flat bed without using bedrails?: None Help needed moving from lying on your back to sitting on the side of a flat bed without using bedrails?: None Help needed moving to and from a bed to a chair (including a wheelchair)?: None Help needed standing up from a chair using your arms (e.g., wheelchair or bedside chair)?: None Help needed to walk in hospital room?: A Little Help needed climbing 3-5 steps with a railing? : A Little 6 Click Score: 22    End of Session Equipment Utilized During Treatment: Oxygen Activity Tolerance: Patient tolerated treatment well;Patient limited by fatigue Patient left: in bed;with call bell/phone within reach Nurse Communication: Mobility status PT Visit Diagnosis: Unsteadiness on feet (R26.81);Other abnormalities of gait and mobility (R26.89);Muscle weakness (generalized) (M62.81)    Time: 4782-9562 PT Time Calculation (min) (ACUTE ONLY): 29 min   Charges:   PT Evaluation $PT Eval Moderate Complexity: 1 Mod PT Treatments $Therapeutic Activity: 23-37 mins PT General Charges $$ ACUTE  PT VISIT: 1 Visit         2:25 PM, 05/27/23 Ocie Bob, MPT Physical Therapist with [facility] 336 [phone] office [phone] mobile phone

## 2023-05-27 NOTE — Discharge Summary (Signed)
Physician Discharge Summary   Patient: [name] MRN: [id number] DOB: 06-Jun-1934  Admit date:     05/21/2023  Discharge date: 05/27/23  Discharge Physician: Vassie Loll   PCP: [name], DO   Recommendations at discharge:  Repeat basic metabolic panel to follow electrolytes and renal function Repeat CBC to follow hemoglobin/WBC trend/stability Repeat chest x-ray in 6 weeks to assure resolution of infiltrates Make sure patient follow-up with cardiology service as instructed.  Discharge Diagnoses: Principal Problem:   CAP (community acquired pneumonia) Active Problems:   Mixed hyperlipidemia   Essential hypertension   History of right breast cancer   Anemia of chronic disease   Acute-on-chronic kidney injury (HCC)   GERD without esophagitis   Acute on chronic respiratory failure with hypoxia (HCC)   Leukocytosis   Hypoalbuminemia due to protein-calorie malnutrition (HCC)   Protein-calorie malnutrition, moderate (HCC)  Brief Hospital admission course: As per H&P written by Dr. Thomes Dinning on 05/21/2023 [name] is a 89 y.o. female with medical history significant of hypertension, hyperlipidemia, atrial fibrillation, moderate to severe aortic stenosis, CKD stage IIIb, hypothyroidism, chronic respiratory failure on 2 LPM of oxygen 24/7 who presents to the emergency department due to 2-day onset of increasing shortness of breath and subjective fever, she complained of having a reddish-brown streak in the sputum once yesterday.  She also complained of pain across her chest which worsens with deep breath.   ED Course:  In the emergency department, BP was 142/50, other vital signs were within normal range.  O2 sat was 92% on 2 LPM of oxygen.  Workup in the ED showed leukocytosis, normocytic anemia, thrombocytopenia, lactic acid was normal, troponin x 1 was influenza A, B, SARS coronavirus 2, RSV was negative.,  Blood culture pending. Chest x-ray showed developing patchy  pulmonary infiltrates bilaterally greater on the left, likely pneumonia.  Cardiac enlargement. Patient was started on IV ceftriaxone and azithromycin, breathing treatment was provided, morphine was given due to pain and Zofran was provided. Hospitalist was asked to admit patient for further evaluation and management.  Assessment and Plan: Community-acquired pneumonia Sepsis-met criteria on admission-with leukocytosis/tachypnea and CAP. Acute on chronic respiratory failure with hypoxia and hypercapnia- o n2l [place] at baseline Chronic metabolic alkalosis in the setting of hypercapnea: Presented with leukocytosis, Pro-Cal 6, shortness of breath subjective fever. BP soft but lactic acid was normal.  Overall patient is clinically improving respiratory status stable, leukocytosis resolved afebrile. Antibiotic has been transitioned to oral route with instructions to complete 3 more days of antibiotics at discharge. -Repeat chest x-ray in 6 weeks to assure resolution of infiltrates.  Pancytopenia with anemia and thrombocytopenia chronic Anemia of chronic disease ?MDS: -WBC count mostly in 2-3 K range and also has chronic anemia and thrombocytopenia.   -Overall remains stable no indication for transfusion. -Patient declined bone marrow biopsy in the past but had considered redoing it, following up with oncology. Patient receives Procrit every 2 weeks, HAS history of right breast cancer.  AKI on CKD 4: C/l creatinine 1.5-1 .8.peaked to 2.3 -> And back to baseline at discharge -Patient advised to maintain adequate hydration -Continue with good volume management and diuresis.  PAF/atrial tachycardia:  Continue bisoprolol, flecainide, at baseline not on anticoagulation due to low burden of A-fib and low risk per her cardiology   Essential hypertension: Resume home antihypertensive agents.   Acute on chronic CHF with preserved EF Moderate to severe aortic stenosis: Recently was admitted for CHF.   On torsemide at home.  Chest x-ray with worsening aeration 10/6 suspects component of CHF superimposed on pneumonia, BNP was elevated 590.  With IV Lasix kidney function improved, patient felt symptomatically better and was able to go home with adjusted dose of oral diuretics. -Continue patient follow-up with cardiology service.Marland Kitchen    HLD: -Continue statin    Hypothyroidism: -Continue Synthroid.   GERD: -Continue Protonix   History of right breast cancer: -She seems to be in remission -Continue letrozole -Continue patient follow-up with oncology service.   Severe malnutrition Low BMI Hypoalbuminemia 2.8: Failure to thrive -Nutrition Problem: Severe Malnutrition -Etiology: chronic illness (CKD, asthma, heart failure) -Signs/Symptoms: severe muscle depletion, severe fat depletion -Interventions: Ensure Enlive (each supplement provides 350kcal and 20 grams of protein and multivitamins). -Continue to follow clinical response.   Goals of care: -Patient is DNR. -She would not want any aggressive measures would not want dialysis -Continue outpatient follow-up with PCP and cardiology service.  Consultants: Cardiology curbside by Dr. Dayna Barker Procedures performed: See below for x-ray reports.. Disposition: Home health Diet recommendation: Heart healthy/low-sodium diet.  DISCHARGE MEDICATION: Allergies as of 05/27/2023       Reactions   Norvasc [amlodipine] Swelling   Edema    Tape Other (See Comments)   Has to have no stick tape.   Zestril [lisinopril] Cough   Hytrin [terazosin] Hives, Nausea Only   Imdur [isosorbide Nitrate] Nausea Only   Sulfonamide Derivatives Nausea And Vomiting, Rash        Medication List     TAKE these medications    amoxicillin-clavulanate 875-125 MG tablet Commonly known as: AUGMENTIN Take 1 tablet by mouth 2 (two) times daily for 3 days.   atorvastatin 20 MG tablet Commonly known as: LIPITOR TAKE 1/2 TABLET BY MOUTH DAILY What changed:  when to take this   azelastine 0.1 % nasal spray Commonly known as: ASTELIN Place 1 spray into both nostrils 2 (two) times daily. For runny nose   bisoprolol 5 MG tablet Commonly known as: ZEBETA TAKE 1 TABLET (5 MG TOTAL) BY MOUTH DAILY.   dextromethorphan-guaiFENesin 30-600 MG 12hr tablet Commonly known as: MUCINEX DM Take 1 tablet by mouth 2 (two) times daily for 7 days.   feeding supplement Liqd Take 237 mLs by mouth 2 (two) times daily between meals. What changed:  when to take this reasons to take this   ferrous sulfate 325 (65 FE) MG tablet Take 325 mg by mouth daily with breakfast.   flecainide 50 MG tablet Commonly known as: TAMBOCOR TAKE 1 TABLET BY MOUTH TWICE A DAY   hydrALAZINE 50 MG tablet Commonly known as: APRESOLINE Take 1 tablet (50 mg total) by mouth 2 (two) times daily.   letrozole 2.5 MG tablet Commonly known as: FEMARA TAKE 1 TABLET BY MOUTH EVERY DAY   levothyroxine 75 MCG tablet Commonly known as: SYNTHROID TAKE 1 TABLET BY MOUTH EVERY DAY What changed: when to take this   mirtazapine 7.5 MG tablet Commonly known as: REMERON TAKE 1 TABLET BY MOUTH EVERYDAY AT BEDTIME   Multivitamin Women 50+ Tabs Take 1 tablet by mouth at bedtime.   pantoprazole 40 MG tablet Commonly known as: PROTONIX TAKE 1 TABLET (40 MG TOTAL) BY MOUTH TWICE A DAY BEFORE MEALS What changed: See the new instructions.   torsemide 20 MG tablet Commonly known as: DEMADEX Take 2 tablets (40 mg total) by mouth daily. Take extra 20mg  if gained more than 3 pounds overnight and/or more than 5 pounds in a week What changed: additional instructions  VITAMIN B-12 PO Take 1 tablet by mouth at bedtime.   VITAMIN D-3 PO Take 1 tablet by mouth at bedtime.        Follow-up Information     [facility] Follow up.   Specialty: Home Health Services Why: Agency will call to set up first home visit. Contact information: [address] [place]  [phone]         [name], DO. Schedule an appointment as soon as possible for a visit in 10 day(s).   Specialty: Family Medicine Contact information: [address] [place] [address] [phone]                Discharge Exam: Ceasar Mons Weights   05/21/23 1954  Weight: 48 kg   General exam: Alert, awake, oriented x 3; at discharge oxygen saturation back to baseline. Respiratory system: Improving movement bilaterally; no wheezing and no frank crackles. Cardiovascular system: Rate controlled, no rubs, no gallops, positive systolic murmur.  No JVD on exam. Gastrointestinal system: Abdomen is nondistended, soft and nontender. No organomegaly or masses felt. Normal bowel sounds heard. Central nervous system: Alert and oriented. No focal neurological deficits. Extremities: No cyanosis or clubbing.  No edema. Skin: No petechiae. Psychiatry: Judgement and insight appear normal. Mood & affect appropriate.    Condition at discharge: Stable and improved.  The results of significant diagnostics from this hospitalization (including imaging, microbiology, ancillary and laboratory) are listed below for reference.   Imaging Studies: DG Chest Port 1 View  Result Date: 05/23/2023 CLINICAL DATA:  89 year old female with history of shortness of breath. EXAM: PORTABLE CHEST 1 VIEW COMPARISON:  Chest x-ray 05/21/2023. FINDINGS: Lung volumes are normal. Diffuse interstitial prominence, widespread peribronchial cuffing and worsening patchy multifocal somewhat nodular appearing airspace disease. Overall, aeration has worsened, particularly throughout the right mid to lower lung. Probable trace bilateral pleural effusions. No pneumothorax. Pulmonary vasculature appears engorged. Heart size is mildly enlarged. Atherosclerotic calcifications are noted in the thoracic aorta. IMPRESSION: 1. Worsening aeration in the lungs, favored to reflect evolving multilobar bilateral  bronchopneumonia, although some degree of pulmonary edema from congestive heart failure is not excluded. 2. Trace bilateral pleural effusions. 3. Mild cardiomegaly. 4. Aortic atherosclerosis. Electronically Signed   By: Trudie Reed M.D.   On: 05/23/2023 10:26   DG Chest 2 View  Result Date: 05/21/2023 CLINICAL DATA:  Fever and productive cough and weakness for 2 days. Elevated white cell count yesterday. EXAM: CHEST - 2 VIEW COMPARISON:  05/05/2023 FINDINGS: Cardiac enlargement. Patchy infiltrates in the lungs bilaterally, progressing since prior study, suggesting multifocal pneumonia. Small left pleural effusion. No pneumothorax. Mediastinal contours appear intact. IMPRESSION: Developing patchy pulmonary infiltrates bilaterally, greater on the left, likely pneumonia. Cardiac enlargement. Electronically Signed   By: Burman Nieves M.D.   On: 05/21/2023 21:05   DG CHEST PORT 1 VIEW  Result Date: 05/05/2023 CLINICAL DATA:  Flash pulmonary edema. EXAM: PORTABLE CHEST 1 VIEW COMPARISON:  May 04, 2023. FINDINGS: Stable cardiomegaly with mild central pulmonary vascular congestion. Possible minimal bibasilar pulmonary edema is noted. Bony thorax is unremarkable. IMPRESSION: Stable cardiomegaly with mild central pulmonary vascular congestion and possible minimal bibasilar pulmonary edema. Electronically Signed   By: Lupita Raider M.D.   On: 05/05/2023 10:23   DG Chest Port 1 View  Result Date: 05/04/2023 CLINICAL DATA:  Acute heart failure. EXAM: PORTABLE CHEST 1 VIEW COMPARISON:  May 03, 2023. FINDINGS: Stable cardiomegaly with mild central pulmonary vascular  congestion. Probable minimal bibasilar pulmonary edema is noted. Small right pleural effusion is noted. Bony thorax is unremarkable. IMPRESSION: Stable cardiomegaly with mild central pulmonary vascular congestion and probable bibasilar pulmonary edema. Small right pleural effusion. Electronically Signed   By: Lupita Raider M.D.   On:  05/04/2023 08:40   DG Chest Portable 1 View  Result Date: 05/03/2023 CLINICAL DATA:  Short of breath, worsening over the last week. EXAM: PORTABLE CHEST 1 VIEW COMPARISON:  04/17/2023 and older exams.  CT, 09/27/2008. FINDINGS: Stable enlargement of the cardiac silhouette. No mediastinal or hilar masses. Lungs are hyperexpanded. Bilateral interstitial thickening. There hazy lung opacities that most prominent in the mid to lower lungs. Bilateral pleural effusions obscure the hemidiaphragms. No pneumothorax. Skeletal structures are grossly intact. IMPRESSION: 1. Cardiomegaly, interstitial thickening and hazy airspace lung opacities suspected to be due to congestive heart failure with pulmonary edema, similar in appearance to the most recent prior study. Electronically Signed   By: Amie Portland M.D.   On: 05/03/2023 13:26    Microbiology: Results for orders placed or performed during the hospital encounter of 05/21/23  Resp panel by RT-PCR (RSV, Flu A&B, Covid) Anterior Nasal Swab     Status: None   Collection Time: 05/21/23  7:59 PM   Specimen: Anterior Nasal Swab  Result Value Ref Range Status   SARS Coronavirus 2 by RT PCR NEGATIVE NEGATIVE Final    Comment: (NOTE) SARS-CoV-2 target nucleic acids are NOT DETECTED.  The SARS-CoV-2 RNA is generally detectable in upper respiratory specimens during the acute phase of infection. The lowest concentration of SARS-CoV-2 viral copies this assay can detect is 138 copies/mL. A negative result does not preclude SARS-Cov-2 infection and should not be used as the sole basis for treatment or other patient management decisions. A negative result may occur with  improper specimen collection/handling, submission of specimen other than nasopharyngeal swab, presence of viral mutation(s) within the areas targeted by this assay, and inadequate number of viral copies(<138 copies/mL). A negative result must be combined with clinical observations, patient history,  and epidemiological information. The expected result is Negative.  Fact Sheet for Patients:  BloggerCourse.com  Fact Sheet for Healthcare Providers:  SeriousBroker.it  This test is no t yet approved or cleared by the [place] FDA and  has been authorized for detection and/or diagnosis of SARS-CoV-2 by FDA under an Emergency Use Authorization (EUA). This EUA will remain  in effect (meaning this test can be used) for the duration of the COVID-19 declaration under Section 564(b)(1) of the Act, 21 U.S.C.section 360bbb-3(b)(1), unless the authorization is terminated  or revoked sooner.       Influenza A by PCR NEGATIVE NEGATIVE Final   Influenza B by PCR NEGATIVE NEGATIVE Final    Comment: (NOTE) The Xpert Xpress SARS-CoV-2/FLU/RSV plus assay is intended as an aid in the diagnosis of influenza from Nasopharyngeal swab specimens and should not be used as a sole basis for treatment. Nasal washings and aspirates are unacceptable for Xpert Xpress SARS-CoV-2/FLU/RSV testing.  Fact Sheet for Patients: BloggerCourse.com  Fact Sheet for Healthcare Providers: SeriousBroker.it  This test is not yet approved or cleared by the [place] FDA and has been authorized for detection and/or diagnosis of SARS-CoV-2 by FDA under an Emergency Use Authorization (EUA). This EUA will remain in effect (meaning this test can be used) for the duration of the COVID-19 declaration under Section 564(b)(1) of the Act, 21 U.S.C. section 360bbb-3(b)(1), unless the authorization is terminated or revoked.  Resp Syncytial Virus by PCR NEGATIVE NEGATIVE Final    Comment: (NOTE) Fact Sheet for Patients: BloggerCourse.com  Fact Sheet for Healthcare Providers: SeriousBroker.it  This test is not yet approved or cleared by the [place] FDA and has  been authorized for detection and/or diagnosis of SARS-CoV-2 by FDA under an Emergency Use Authorization (EUA). This EUA will remain in effect (meaning this test can be used) for the duration of the COVID-19 declaration under Section 564(b)(1) of the Act, 21 U.S.C. section 360bbb-3(b)(1), unless the authorization is terminated or revoked.  Performed at [facility], [address] [place], [place] [address]   Culture, blood (routine x 2)     Status: None   Collection Time: 05/21/23  9:29 PM   Specimen: BLOOD  Result Value Ref Range Status   Specimen Description BLOOD BLOOD LEFT ARM  Final   Special Requests   Final    BOTTLES DRAWN AEROBIC AND ANAEROBIC Blood Culture results may not be optimal due to an excessive volume of blood received in culture bottles   Culture   Final    NO GROWTH 5 DAYS Performed at [facility], [address], [place], [place] [address]    Report Status 05/26/2023 FINAL  Final  Culture, blood (routine x 2)     Status: None   Collection Time: 05/21/23  9:33 PM   Specimen: BLOOD  Result Value Ref Range Status   Specimen Description BLOOD BLOOD RIGHT ARM  Final   Special Requests   Final    BOTTLES DRAWN AEROBIC AND ANAEROBIC Blood Culture adequate volume   Culture   Final    NO GROWTH 5 DAYS Performed at [facility], [address], [place], [place] [address]    Report Status 05/26/2023 FINAL  Final   *Note: Due to a large number of results and/or encounters for the requested time period, some results have not been displayed. A complete set of results can be found in Results Review.    Labs: CBC: Recent Labs  Lab 05/21/23 2129 05/22/23 0523 05/23/23 0614 05/24/23 0404 05/25/23 0405 05/26/23 0434 05/27/23 0458  WBC 16.2*   < > 6.3 10.5 14.9* 8.6 9.4  NEUTROABS 14.6*  --   --   --   --   --   --   HGB 9.2*   < > 7.8* 8.3* 8.5* 7.7* 8.0*  HCT 29.9*   < > 25.1* 27.9* 27.6* 25.9* 25.5*  MCV 88.2   < > 90.0 90.6 89.0 90.6 88.5  PLT 89*   < > 76*  94* 105* 102* 119*   < > = values in this interval not displayed.   Basic Metabolic Panel: Recent Labs  Lab 05/22/23 0523 05/23/23 0614 05/24/23 0404 05/25/23 0405 05/26/23 0434 05/27/23 0458  NA 131* 134* 136 135 136 135  K 3.9 3.9 4.1 4.1 3.8 3.9  CL 89* 94* 93* 90* 92* 91*  CO2 33* 33* 35* 33* 34* 34*  GLUCOSE 129* 106* 137* 175* 115* 106*  BUN 66* 71* 57* 43* 49* 49*  CREATININE 2.34* 2.11* 1.52* 1.28* 1.39* 1.33*  CALCIUM 7.9* 7.8* 8.5* 8.6* 8.3* 8.7*  MG 2.0  --   --   --   --   --   PHOS 3.3  --   --   --   --   --    Liver Function Tests: Recent Labs  Lab 05/21/23 2129 05/22/23 0523  AST 22 20  ALT 14 13  ALKPHOS 47  45  BILITOT 1.2 1.1  PROT 7.1 5.9*  ALBUMIN 3.3* 2.8*   CBG: No results for input(s): "GLUCAP" in the last 168 hours.  Discharge time spent: greater than 30 minutes.  Signed: Vassie Loll, MD [facility] 05/27/2023

## 2023-05-27 NOTE — Telephone Encounter (Signed)
Daughter, Enis Slipper, brought in FMLA paperwork to be completed for her to take care of mom.  Fee paid - Yes  Form placed in HH/RX coordinator's office for completion.

## 2023-05-27 NOTE — Plan of Care (Signed)
  Problem: Activity: Goal: Ability to tolerate increased activity will improve Outcome: Progressing   Problem: Clinical Measurements: Goal: Ability to maintain a body temperature in the normal range will improve Outcome: Progressing   Problem: Respiratory: Goal: Ability to maintain adequate ventilation will improve Outcome: Progressing Goal: Ability to maintain a clear airway will improve Outcome: Progressing   Problem: Education: Goal: Knowledge of General Education information will improve Description: Including pain rating scale, medication(s)/side effects and non-pharmacologic comfort measures Outcome: Progressing   Problem: Health Behavior/Discharge Planning: Goal: Ability to manage health-related needs will improve Outcome: Progressing   Problem: Clinical Measurements: Goal: Ability to maintain clinical measurements within normal limits will improve Outcome: Progressing Goal: Will remain free from infection Outcome: Progressing Goal: Diagnostic test results will improve Outcome: Progressing Goal: Respiratory complications will improve Outcome: Progressing Goal: Cardiovascular complication will be avoided Outcome: Progressing   Problem: Activity: Goal: Risk for activity intolerance will decrease Outcome: Progressing   Problem: Nutrition: Goal: Adequate nutrition will be maintained Outcome: Progressing   Problem: Coping: Goal: Level of anxiety will decrease Outcome: Progressing   Problem: Elimination: Goal: Will not experience complications related to bowel motility Outcome: Progressing Goal: Will not experience complications related to urinary retention Outcome: Progressing   Problem: Pain Managment: Goal: General experience of comfort will improve Outcome: Progressing   Problem: Safety: Goal: Ability to remain free from injury will improve Outcome: Progressing   Problem: Skin Integrity: Goal: Risk for impaired skin integrity will decrease Outcome:  Progressing   Problem: Activity: Goal: Ability to tolerate increased activity will improve Outcome: Progressing   Problem: Clinical Measurements: Goal: Ability to maintain a body temperature in the normal range will improve Outcome: Progressing   Problem: Respiratory: Goal: Ability to maintain adequate ventilation will improve Outcome: Progressing Goal: Ability to maintain a clear airway will improve Outcome: Progressing

## 2023-05-27 NOTE — Consult Note (Signed)
[facility] CM Inpatient Consult   05/27/2023  [name] [birth year] [id number]  Primary Care Provider:  Dr. Delynn Flavin ([facility])  Patient is currently active with Care Management for chronic disease management services.  Patient has been engaged by a Charity fundraiser and Child psychotherapist.  Our community based plan of care has focused on disease management and community resource support.   Patient will receive a post hospital call and will be evaluated for assessments and disease process education.   Plan: Pt will return home with [facility]Health arranged.  Inpatient Transition Of Care [TOC] team member was not assigned at this time for notification.  Of note, Care Management services does not replace or interfere with any services that are needed or arranged by inpatient [facility] care management team.   For additional questions or referrals please contact:    Elliot Cousin, RN, [facility] Liaison [place]   Population Health Office Hours MTWF  8:00 am-6:00 pm [phone] mobile [phone] [Office toll free line] Office Hours are M-F 8:30 - 5 pm [email or web address]

## 2023-05-27 NOTE — Progress Notes (Signed)
Palliative:  Chart review completed.  Overall, [name] is progressing.  Her goals are set for treat the treatable but no CPR or intubation.  She and her family are arranging care at her home with the assistance of transition of care team.  No needs at this time.  Face-to-face conference with attending and transition of care team related to patient condition, needs, goals of care, disposition.  Plan: Continue to treat the treatable.  Home with the benefits of home health and family support. DNR/goldenrod form completed and placed on chart.  No charge Lillia Carmel, NP Palliative medicine team Team phone [phone]

## 2023-05-27 NOTE — TOC Progression Note (Signed)
Transition of Care [facility]) - Progression Note    Patient Details  Name: [name] MRN: [id number] Date of Birth: 13-Nov-1933  Transition of Care [facility]) CM/SW Contact  Villa Herb, [place] Phone Number: 05/27/2023, 3:04 PM  Clinical Narrative:    PT recommending HH PT for pt at D/C. CSW spoke with pts daughter who states they are agreeable to this and do not have an agency preference. CSW spoke to [facility] rep Kandee Keen who states they can accept for [facility] PT/OT/RN services, MD has placed HH orders. Pt has needed DME in the home to use if/when it might be needed. TOC to follow.   Expected Discharge Plan: Home/Self Care Barriers to Discharge: Continued Medical Work up  Expected Discharge Plan and Services                                               Social Determinants of Health (SDOH) Interventions SDOH Screenings   Food Insecurity: No Food Insecurity (05/22/2023)  Housing: Low Risk  (05/22/2023)  Transportation Needs: No Transportation Needs (05/22/2023)  Utilities: Not At Risk (05/22/2023)  Alcohol Screen: Low Risk  (12/15/2022)  Depression (PHQ2-9): Low Risk  (05/19/2023)  Financial Resource Strain: Low Risk  (12/15/2022)  Physical Activity: Inactive (12/15/2022)  Social Connections: Moderately Isolated (12/15/2022)  Stress: No Stress Concern Present (12/15/2022)  Tobacco Use: Low Risk  (05/25/2023)    Readmission Risk Interventions    05/04/2023   11:02 AM 04/03/2023   11:33 AM 08/27/2022   11:36 AM  Readmission Risk Prevention Plan  Transportation Screening Complete Complete Complete  HRI or Home Care Consult  Complete   Social Work Consult for Recovery Care Planning/Counseling  Complete   Palliative Care Screening  Not Applicable   Medication Review Oceanographer) Complete Complete Complete  HRI or Home Care Consult Complete  Complete  SW Recovery Care/Counseling Consult Complete  Complete  Palliative Care Screening Not Applicable  Not Applicable  Skilled  Nursing Facility Not Applicable  Not Applicable

## 2023-05-27 NOTE — Telephone Encounter (Signed)
She will be dropping off FMLA paperwork.  I'll try and expedite this for her.

## 2023-05-28 ENCOUNTER — Ambulatory Visit: Payer: PPO

## 2023-05-28 ENCOUNTER — Telehealth: Payer: Self-pay

## 2023-05-28 NOTE — Telephone Encounter (Signed)
Information completed and forwarded to PCP 

## 2023-05-28 NOTE — Transitions of Care (Post Inpatient/ED Visit) (Signed)
05/28/2023  Name: [name] MRN: [id number] DOB: 05-11-1934  Today's TOC FU Call Status: Today's TOC FU Call Status:: Successful TOC FU Call Completed TOC FU Call Complete Date: 05/28/23 Patient's Name and Date of Birth confirmed.  Transition Care Management Follow-up Telephone Call Date of Discharge: 05/27/23 [facility] Type of Discharge: Inpatient Admission Primary Inpatient Discharge Diagnosis:: Pneumonia How have you been since you were released from the hospital?: Same Any questions or concerns?: No  Items Reviewed: Did you receive and understand the discharge instructions provided?: Yes Medications obtained,verified, and reconciled?: Yes (Medications Reviewed) Any new allergies since your discharge?: No Dietary orders reviewed?: NA Do you have support at home?: Yes People in Home: child(ren), adult Name of Support/Comfort Primary Source: [name]  Medications Reviewed Today: Medications Reviewed Today     Reviewed by Redge Gainer, RN (Case Manager) on 05/28/23 at 1517  Med List Status: <None>   Medication Order Taking? Sig Documenting Provider Last Dose Status Informant  amoxicillin-clavulanate (AUGMENTIN) 875-125 MG tablet [id number] Yes Take 1 tablet by mouth 2 (two) times daily for 3 days. Vassie Loll, MD Taking Active   atorvastatin (LIPITOR) 20 MG tablet [id number] Yes TAKE 1/2 TABLET BY MOUTH DAILY  Patient taking differently: Take 10 mg by mouth at bedtime.   Delynn Flavin M, DO Taking Active Self  azelastine (ASTELIN) 0.1 % nasal spray [id number] Yes Place 1 spray into both nostrils 2 (two) times daily. For runny nose Delynn Flavin M, DO Taking Active Self  bisoprolol (ZEBETA) 5 MG tablet [id number] Yes TAKE 1 TABLET (5 MG TOTAL) BY MOUTH DAILY. Delynn Flavin M, DO Taking Active Self  Cholecalciferol (VITAMIN D-3 PO) [id number] Yes Take 1 tablet by mouth at bedtime. [provider] Taking Active Self   Cyanocobalamin (VITAMIN B-12 PO) [id number] Yes Take 1 tablet by mouth at bedtime. [provider] Taking Active Self  dextromethorphan-guaiFENesin (MUCINEX DM) 30-600 MG 12hr tablet [id number] Yes Take 1 tablet by mouth 2 (two) times daily for 7 days. Vassie Loll, MD Taking Active   feeding supplement (ENSURE ENLIVE / ENSURE PLUS) LIQD [id number] Yes Take 237 mLs by mouth 2 (two) times daily between meals.  Patient taking differently: Take 237 mLs by mouth daily as needed (decreased appetite).   Catarina Hartshorn, MD Taking Active Self           Med Note (COFFELL, Marzella Schlein   Sun May 03, 2023  3:11 PM)    ferrous sulfate 325 (65 FE) MG tablet [id number] Yes Take 325 mg by mouth daily with breakfast. [provider] Taking Active Self  flecainide (TAMBOCOR) 50 MG tablet [id number] Yes TAKE 1 TABLET BY MOUTH TWICE A Kenn File, MD Taking Active Self  hydrALAZINE (APRESOLINE) 50 MG tablet [id number] No Take 1 tablet (50 mg total) by mouth 2 (two) times daily. Lewie Chamber, MD Unknown Active Self  letrozole [facility]) 2.5 MG tablet [id number] Yes TAKE 1 TABLET BY MOUTH EVERY DAY Doreatha Massed, MD Taking Active Self  levothyroxine (SYNTHROID) 75 MCG tablet [id number] Yes TAKE 1 TABLET BY MOUTH EVERY DAY  Patient taking differently: Take 75 mcg by mouth daily before breakfast.   [name], DO Taking Active Self  mirtazapine (REMERON) 7.5 MG tablet [id number] Yes TAKE 1 TABLET BY MOUTH EVERYDAY AT BEDTIME Letta Median, PA-C Taking Active Self  Multiple Vitamins-Minerals (MULTIVITAMIN WOMEN 50+) TABS [id number] Yes Take 1 tablet by mouth at bedtime. [provider] Taking Active Self  pantoprazole (  PROTONIX) 40 MG tablet [id number] Yes TAKE 1 TABLET (40 MG TOTAL) BY MOUTH TWICE A DAY BEFORE MEALS  Patient taking differently: Take 80 mg by mouth daily.   Letta Median, PA-C Taking Active Self  torsemide (DEMADEX) 20 MG tablet [id number] Yes Take 2 tablets (40  mg total) by mouth daily. Take extra 20mg  if gained more than 3 pounds overnight and/or more than 5 pounds in a week Vassie Loll, MD Taking Active             Home Care and Equipment/Supplies: Were Home Health Services Ordered?: Yes Name of Home Health Agency:: [facility] Has Agency set up a time to come to your home?: Yes First Home Health Visit Date:  (She stated the [facility] provider called and talked with her sister) Any new equipment or medical supplies ordered?: No  Functional Questionnaire: Do you need assistance with bathing/showering or dressing?: Yes Do you need assistance with meal preparation?: Yes Do you need assistance with eating?: No Do you have difficulty maintaining continence: No Do you need assistance with getting out of bed/getting out of a chair/moving?: Yes Do you have difficulty managing or taking your medications?: No  Follow up appointments reviewed: PCP Follow-up appointment confirmed?: Yes Date of PCP follow-up appointment?: 06/12/23 Follow-up Provider: [facility] Follow-up appointment confirmed?: NA Do you need transportation to your follow-up appointment?: No Do you understand care options if your condition(s) worsen?: Yes-patient verbalized understanding  SDOH Interventions Today    Flowsheet Row Most Recent Value  SDOH Interventions   Food Insecurity Interventions Intervention Not Indicated  Transportation Interventions Intervention Not Indicated       Deidre Ala, RN RN Care Manager [facility]-Population Health [phone]

## 2023-05-29 ENCOUNTER — Ambulatory Visit (HOSPITAL_COMMUNITY)
Admission: RE | Admit: 2023-05-29 | Discharge: 2023-05-29 | Disposition: A | Discharge status: Home / Self Care | Payer: PPO | Source: Ambulatory Visit | Attending: Pulmonary Disease | Admitting: Pulmonary Disease

## 2023-05-29 ENCOUNTER — Encounter: Payer: PPO | Admitting: *Deleted

## 2023-05-29 DIAGNOSIS — J849 Interstitial pulmonary disease, unspecified: Secondary | ICD-10-CM | POA: Insufficient documentation

## 2023-05-29 DIAGNOSIS — J189 Pneumonia, unspecified organism: Secondary | ICD-10-CM | POA: Diagnosis not present

## 2023-05-29 DIAGNOSIS — I5032 Chronic diastolic (congestive) heart failure: Secondary | ICD-10-CM | POA: Diagnosis not present

## 2023-05-29 DIAGNOSIS — N184 Chronic kidney disease, stage 4 (severe): Secondary | ICD-10-CM | POA: Diagnosis not present

## 2023-05-29 DIAGNOSIS — R918 Other nonspecific abnormal finding of lung field: Secondary | ICD-10-CM | POA: Diagnosis not present

## 2023-05-29 DIAGNOSIS — Z66 Do not resuscitate: Secondary | ICD-10-CM | POA: Diagnosis not present

## 2023-05-29 DIAGNOSIS — J90 Pleural effusion, not elsewhere classified: Secondary | ICD-10-CM | POA: Diagnosis not present

## 2023-05-29 DIAGNOSIS — R911 Solitary pulmonary nodule: Secondary | ICD-10-CM | POA: Diagnosis not present

## 2023-06-01 ENCOUNTER — Ambulatory Visit: Payer: PPO | Admitting: Gastroenterology

## 2023-06-02 ENCOUNTER — Telehealth: Payer: Self-pay | Admitting: Pharmacy Technician

## 2023-06-02 DIAGNOSIS — K219 Gastro-esophageal reflux disease without esophagitis: Secondary | ICD-10-CM | POA: Diagnosis not present

## 2023-06-02 DIAGNOSIS — I700 Atherosclerosis of aorta: Secondary | ICD-10-CM | POA: Diagnosis not present

## 2023-06-02 DIAGNOSIS — D631 Anemia in chronic kidney disease: Secondary | ICD-10-CM | POA: Diagnosis not present

## 2023-06-02 DIAGNOSIS — N184 Chronic kidney disease, stage 4 (severe): Secondary | ICD-10-CM | POA: Diagnosis not present

## 2023-06-02 DIAGNOSIS — J9622 Acute and chronic respiratory failure with hypercapnia: Secondary | ICD-10-CM | POA: Diagnosis not present

## 2023-06-02 DIAGNOSIS — Z853 Personal history of malignant neoplasm of breast: Secondary | ICD-10-CM | POA: Diagnosis not present

## 2023-06-02 DIAGNOSIS — E43 Unspecified severe protein-calorie malnutrition: Secondary | ICD-10-CM | POA: Diagnosis not present

## 2023-06-02 DIAGNOSIS — J189 Pneumonia, unspecified organism: Secondary | ICD-10-CM | POA: Diagnosis not present

## 2023-06-02 DIAGNOSIS — I4719 Other supraventricular tachycardia: Secondary | ICD-10-CM | POA: Diagnosis not present

## 2023-06-02 DIAGNOSIS — D696 Thrombocytopenia, unspecified: Secondary | ICD-10-CM | POA: Diagnosis not present

## 2023-06-02 DIAGNOSIS — D61818 Other pancytopenia: Secondary | ICD-10-CM | POA: Diagnosis not present

## 2023-06-02 DIAGNOSIS — I480 Paroxysmal atrial fibrillation: Secondary | ICD-10-CM | POA: Diagnosis not present

## 2023-06-02 DIAGNOSIS — E782 Mixed hyperlipidemia: Secondary | ICD-10-CM | POA: Diagnosis not present

## 2023-06-02 DIAGNOSIS — J9621 Acute and chronic respiratory failure with hypoxia: Secondary | ICD-10-CM | POA: Diagnosis not present

## 2023-06-02 DIAGNOSIS — R918 Other nonspecific abnormal finding of lung field: Secondary | ICD-10-CM | POA: Diagnosis not present

## 2023-06-02 DIAGNOSIS — E8809 Other disorders of plasma-protein metabolism, not elsewhere classified: Secondary | ICD-10-CM | POA: Diagnosis not present

## 2023-06-02 DIAGNOSIS — Z9981 Dependence on supplemental oxygen: Secondary | ICD-10-CM | POA: Diagnosis not present

## 2023-06-02 DIAGNOSIS — E039 Hypothyroidism, unspecified: Secondary | ICD-10-CM | POA: Diagnosis not present

## 2023-06-02 DIAGNOSIS — I350 Nonrheumatic aortic (valve) stenosis: Secondary | ICD-10-CM | POA: Diagnosis not present

## 2023-06-02 DIAGNOSIS — I130 Hypertensive heart and chronic kidney disease with heart failure and stage 1 through stage 4 chronic kidney disease, or unspecified chronic kidney disease: Secondary | ICD-10-CM | POA: Diagnosis not present

## 2023-06-02 DIAGNOSIS — Z9181 History of falling: Secondary | ICD-10-CM | POA: Diagnosis not present

## 2023-06-02 DIAGNOSIS — N179 Acute kidney failure, unspecified: Secondary | ICD-10-CM | POA: Diagnosis not present

## 2023-06-02 DIAGNOSIS — E873 Alkalosis: Secondary | ICD-10-CM | POA: Diagnosis not present

## 2023-06-02 DIAGNOSIS — I5033 Acute on chronic diastolic (congestive) heart failure: Secondary | ICD-10-CM | POA: Diagnosis not present

## 2023-06-02 NOTE — Telephone Encounter (Signed)
PCP completed and signed FMLA forms. They have been faxed to [facility] at fax number [phone]. Patient has been contacted and informed they are complete, copy at front desk.

## 2023-06-02 NOTE — Telephone Encounter (Signed)
Received fax from pt's ins abt hospital admission. Indexed to pt's chart.

## 2023-06-03 ENCOUNTER — Ambulatory Visit: Payer: Self-pay | Admitting: *Deleted

## 2023-06-03 ENCOUNTER — Other Ambulatory Visit: Payer: Self-pay

## 2023-06-03 DIAGNOSIS — D649 Anemia, unspecified: Secondary | ICD-10-CM

## 2023-06-03 DIAGNOSIS — Z170 Estrogen receptor positive status [ER+]: Secondary | ICD-10-CM

## 2023-06-03 DIAGNOSIS — M8580 Other specified disorders of bone density and structure, unspecified site: Secondary | ICD-10-CM

## 2023-06-03 NOTE — Patient Outreach (Signed)
Care Coordination   Follow Up Visit Note   06/03/2023 Name: [name] MRN: [id number] DOB: 1934/01/03  [name] is a 89 y.o. year old female who sees [name], DO for primary care. I spoke with  [name] by phone today.  What matters to the patients health and wellness today?  "Feeling a little better today" She was recently discharged home from hospital with pneumonia  Prior to this her daughter had encouraged calls to the patient home number. RN CM noticed this had been change in demographic. RN CM asked patient if RN CM needed to call [name] Daughter and the patient confirmed "No". Changed demographics to reflect this   Patient confirms since discharge that she has support in the home and her mobility is good. RN CM noted voice of a female in the background  She reported no worsening symptoms when assessment completed. No headache, chest pain, no dizziness, appetite improved, completed antibiotics, no pain   Audible congested coughing heard during outreach. Denied discolored secretions   She has [facility] home health PT visits twice a week    Goals Addressed             This Visit's Progress    decrease re admission risks -RN CM coordination services   Not on track    Interventions Today    Flowsheet Row Most Recent Value  Chronic Disease   Chronic disease during today's visit Congestive Heart Failure (CHF), Other  [pneumonia]  General Interventions   General Interventions Discussed/Reviewed General Interventions Reviewed, Doctor Visits  Doctor Visits Discussed/Reviewed Doctor Visits Discussed, PCP, Specialist  PCP/Specialist Visits Compliance with follow-up visit  Exercise Interventions   Exercise Discussed/Reviewed Exercise Discussed, Physical Activity, Assistive device use and maintanence  Physical Activity Discussed/Reviewed Physical Activity Discussed, Types of exercise  [home health PT with [facility]]  Education Interventions   Education  Provided Provided Education  Provided Verbal Education On Nutrition, Medication  Mental Health Interventions   Mental Health Discussed/Reviewed Mental Health Discussed, Coping Strategies  Nutrition Interventions   Nutrition Discussed/Reviewed Nutrition Discussed, Fluid intake, Decreasing salt  Pharmacy Interventions   Pharmacy Dicussed/Reviewed Pharmacy Topics Discussed, Medications and their functions, Affording Medications  Safety Interventions   Safety Discussed/Reviewed Safety Discussed, Home Safety  Home Safety Assistive Devices  Advanced Directive Interventions   Advanced Directives Discussed/Reviewed Advanced Directives Discussed  [has scanned MOST forms in chart]              SDOH assessments and interventions completed:  No     Care Coordination Interventions:  Yes, provided   Follow up plan: Follow up call scheduled for 06/10/23    Encounter Outcome:  Patient Visit Completed   Cala Bradford L. Noelle Penner, RN, BSN, [facility]  [facility]BCI Care Management Coordinator  [phone]  Fax: [phone]

## 2023-06-03 NOTE — Patient Instructions (Signed)
Visit Information  Thank you for taking time to visit with me today. Please don't hesitate to contact me if I can be of assistance to you.   Following are the goals we discussed today:   Goals Addressed             This Visit's Progress    decrease re admission risks -RN CM coordination services   Not on track    Interventions Today    Flowsheet Row Most Recent Value  Chronic Disease   Chronic disease during today's visit Congestive Heart Failure (CHF), Other  [pneumonia]  General Interventions   General Interventions Discussed/Reviewed General Interventions Reviewed, Doctor Visits  Doctor Visits Discussed/Reviewed Doctor Visits Discussed, PCP, Specialist  PCP/Specialist Visits Compliance with follow-up visit  Exercise Interventions   Exercise Discussed/Reviewed Exercise Discussed, Physical Activity, Assistive device use and maintanence  Physical Activity Discussed/Reviewed Physical Activity Discussed, Types of exercise  [home health PT with [facility]]  Education Interventions   Education Provided Provided Education  Provided Verbal Education On Nutrition, Medication  Mental Health Interventions   Mental Health Discussed/Reviewed Mental Health Discussed, Coping Strategies  Nutrition Interventions   Nutrition Discussed/Reviewed Nutrition Discussed, Fluid intake, Decreasing salt  Pharmacy Interventions   Pharmacy Dicussed/Reviewed Pharmacy Topics Discussed, Medications and their functions, Affording Medications  Safety Interventions   Safety Discussed/Reviewed Safety Discussed, Home Safety  Home Safety Assistive Devices  Advanced Directive Interventions   Advanced Directives Discussed/Reviewed Advanced Directives Discussed  [has scanned MOST forms in chart]              Our next appointment is by telephone on 06/10/23 at 1:45 pm  Please call the care guide team at [phone] if you need to cancel or reschedule your appointment.   If you are experiencing a Mental  Health or Behavioral Health Crisis or need someone to talk to, please call the Suicide and Crisis Lifeline: 988 call the [place] National Suicide Prevention Lifeline: 309 356 2566 or TTY: (703) 040-7805 TTY 612-196-8558) to talk to a trained counselor call 1-800-273-TALK (toll free, 24 hour hotline) call the [facility]: 507-432-9445 call 911   Patient verbalizes understanding of instructions and care plan provided today and agrees to view in MyChart. Active MyChart status and patient understanding of how to access instructions and care plan via MyChart confirmed with patient.     The patient has been provided with contact information for the care management team and has been advised to call with any health related questions or concerns.    Nia Nathaniel L. Noelle Penner, RN, BSN, [facility]  VBCI Care Management Coordinator  [phone]  Fax: [phone]

## 2023-06-04 ENCOUNTER — Inpatient Hospital Stay: Payer: PPO

## 2023-06-04 ENCOUNTER — Inpatient Hospital Stay: Payer: PPO | Admitting: Oncology

## 2023-06-04 VITALS — BP 171/48 | HR 57 | Temp 95.0°F | Resp 20

## 2023-06-04 DIAGNOSIS — M8580 Other specified disorders of bone density and structure, unspecified site: Secondary | ICD-10-CM

## 2023-06-04 DIAGNOSIS — Z79811 Long term (current) use of aromatase inhibitors: Secondary | ICD-10-CM | POA: Diagnosis not present

## 2023-06-04 DIAGNOSIS — C50511 Malignant neoplasm of lower-outer quadrant of right female breast: Secondary | ICD-10-CM

## 2023-06-04 DIAGNOSIS — D61818 Other pancytopenia: Secondary | ICD-10-CM

## 2023-06-04 DIAGNOSIS — Z170 Estrogen receptor positive status [ER+]: Secondary | ICD-10-CM

## 2023-06-04 DIAGNOSIS — D631 Anemia in chronic kidney disease: Secondary | ICD-10-CM | POA: Diagnosis not present

## 2023-06-04 DIAGNOSIS — D649 Anemia, unspecified: Secondary | ICD-10-CM

## 2023-06-04 DIAGNOSIS — N183 Chronic kidney disease, stage 3 unspecified: Secondary | ICD-10-CM | POA: Diagnosis not present

## 2023-06-04 LAB — FERRITIN: Ferritin: 433 ng/mL — ABNORMAL HIGH (ref 11–307)

## 2023-06-04 LAB — CBC WITH DIFFERENTIAL/PLATELET
Abs Immature Granulocytes: 0.03 10*3/uL (ref 0.00–0.07)
Basophils Absolute: 0 10*3/uL (ref 0.0–0.1)
Basophils Relative: 1 %
Eosinophils Absolute: 0 10*3/uL (ref 0.0–0.5)
Eosinophils Relative: 0 %
HCT: 26.1 % — ABNORMAL LOW (ref 36.0–46.0)
Hemoglobin: 8.2 g/dL — ABNORMAL LOW (ref 12.0–15.0)
Immature Granulocytes: 1 %
Lymphocytes Relative: 19 %
Lymphs Abs: 0.7 10*3/uL (ref 0.7–4.0)
MCH: 28 pg (ref 26.0–34.0)
MCHC: 31.4 g/dL (ref 30.0–36.0)
MCV: 89.1 fL (ref 80.0–100.0)
Monocytes Absolute: 0.3 10*3/uL (ref 0.1–1.0)
Monocytes Relative: 8 %
Neutro Abs: 2.8 10*3/uL (ref 1.7–7.7)
Neutrophils Relative %: 71 %
Platelets: 155 10*3/uL (ref 150–400)
RBC: 2.93 MIL/uL — ABNORMAL LOW (ref 3.87–5.11)
RDW: 15.9 % — ABNORMAL HIGH (ref 11.5–15.5)
WBC: 3.9 10*3/uL — ABNORMAL LOW (ref 4.0–10.5)
nRBC: 0 % (ref 0.0–0.2)

## 2023-06-04 LAB — COMPREHENSIVE METABOLIC PANEL
ALT: 15 U/L (ref 0–44)
AST: 22 U/L (ref 15–41)
Albumin: 3.3 g/dL — ABNORMAL LOW (ref 3.5–5.0)
Alkaline Phosphatase: 53 U/L (ref 38–126)
Anion gap: 10 (ref 5–15)
BUN: 31 mg/dL — ABNORMAL HIGH (ref 8–23)
CO2: 36 mmol/L — ABNORMAL HIGH (ref 22–32)
Calcium: 8.3 mg/dL — ABNORMAL LOW (ref 8.9–10.3)
Chloride: 85 mmol/L — ABNORMAL LOW (ref 98–111)
Creatinine, Ser: 1.55 mg/dL — ABNORMAL HIGH (ref 0.44–1.00)
GFR, Estimated: 32 mL/min — ABNORMAL LOW (ref >60)
Glucose, Bld: 110 mg/dL — ABNORMAL HIGH (ref 70–99)
Potassium: 4.2 mmol/L (ref 3.5–5.1)
Sodium: 131 mmol/L — ABNORMAL LOW (ref 135–145)
Total Bilirubin: 0.8 mg/dL (ref 0.3–1.2)
Total Protein: 6.7 g/dL (ref 6.5–8.1)

## 2023-06-04 LAB — SAMPLE TO BLOOD BANK

## 2023-06-04 LAB — LACTATE DEHYDROGENASE: LDH: 117 U/L (ref 98–192)

## 2023-06-04 LAB — IRON AND TIBC
Iron: 46 ug/dL (ref 28–170)
Saturation Ratios: 16 % (ref 10.4–31.8)
TIBC: 291 ug/dL (ref 250–450)
UIBC: 245 ug/dL

## 2023-06-04 MED ORDER — EPOETIN ALFA-EPBX 10000 UNIT/ML IJ SOLN
10000.0000 [IU] | Freq: Once | INTRAMUSCULAR | Status: AC
  Administered 2023-06-04: 10000 [IU] via SUBCUTANEOUS
  Filled 2023-06-04: qty 1

## 2023-06-04 NOTE — Progress Notes (Signed)
[name] [address], [place] [address]   CLINIC:  Medical Oncology/Hematology  PCP:  Janet Ip, DO [address] [place] [address] [phone]  REASON FOR VISIT:  Follow-up for normocytic anemia + leukopenia/thrombocytopenia + history of breast cancer  BRIEF ONCOLOGIC HISTORY:   Oncology History Overview Note  Cancer Staging Malignant neoplasm of lower-outer quadrant of right breast of female, estrogen receptor positive (HCC) Staging form: Breast, AJCC 8th Edition - Clinical stage from 02/22/2018: Stage IA (cT1c, cN0, cM0, G1, ER+, PR+, HER2-) - Signed by [name] on 03/02/2018    Malignant neoplasm of lower-outer quadrant of right breast of female, estrogen receptor positive (HCC)  02/12/2018 Mammogram   She had routine screening bilateral mammography on 02/12/2018 at [facility] with results showing: indeterminate irregular mass in the right breast.    02/16/2018 Mammogram   She underwent right diagnostic mammography with tomography and right breast ultrasonography at [facility] on 02/16/2018 showing: 1.1 cm irregular mass at the 8 O'clock position on the right breast   02/22/2018 Pathology Results   Diagnosis 1. Breast, right, needle core biopsy, lateral, 8 o'clock - INVASIVE AND IN SITU DUCTAL CARCINOMA. - SEE COMMENT. 2. Breast, right, needle core biopsy, 11 o'clock - FIBROCYSTIC CHANGES. - USUAL DUCTAL HYPERPLASIA. - THERE IS NO EVIDENCE OF MALIGNANCY.   02/22/2018 Receptors her2   IMMUNOHISTOCHEMICAL AND MORPHOMETRIC ANALYSIS PERFORMED MANUALLY Estrogen Receptor: 100%, POSITIVE, STRONG STAINING INTENSITY Progesterone Receptor: 40%, POSITIVE, MODERATE STAINING INTENSITY Proliferation Marker Ki67: 1% Her2 Negative   02/22/2018 Cancer Staging   Staging form: Breast, AJCC 8th Edition - Clinical stage from 02/22/2018: Stage IA (cT1c, cN0, cM0, G1, ER+, PR+, HER2-) - Signed by [name] on 03/02/2018    02/25/2018 Initial Diagnosis    Malignant neoplasm of lower-outer quadrant of right breast of female, estrogen receptor positive (HCC)     CANCER STAGING:  Cancer Staging  Malignant neoplasm of lower-outer quadrant of right breast of female, estrogen receptor positive (HCC) Staging form: Breast, AJCC 8th Edition - Clinical stage from 02/22/2018: Stage IA (cT1c, cN0, cM0, G1, ER+, PR+, HER2-) - Signed by [name] on 03/02/2018   INTERVAL HISTORY:   [name], a 89 y.o. female, returns for routine follow-up of her breast cancer and pancytopenia. [name] was last seen on 04/22/2023 by Janet Brenner, PA-C.  She was hospitalized from 05/21/2023-05/27/2023 for community-acquired pneumonia.  Had 2-day onset of increasing shortness of breath and subjective fever with reddish-brown streaks in sputum.  She was also having pain with deep breaths.  Chest x-ray showed patchy pulmonary infiltrates bilaterally greater on the left likely pneumonia and cardiac enlargement.  She was given IV ceftriaxone and azithromycin and breathing treatments.  She was discharged home and is continuing to recover.  She was hospitalized from 05/03/2023-05/08/2023 for shortness of breath and diagnosed with acute on chronic heart failure.  Her diuretics were adjusted and she was sent home with torsemide 40 mg every morning.  She reports compliance with her medications.  She was started on Retacrit 10,000 units every 2 weeks first dose given on 04/22/2023.  Hemoglobin has essentially remained stable since starting Retacrit.  Tolerated injections well.  She denies any new concerning symptoms.  Denies B symptoms including fever, chills, night sweats unintentional weight loss.  She continues letrozole and denies any side effects.  She is tolerating Prolia injections well last given on 04/22/2023.  She continues on oxygen 24/7 over the past  several months for CHF and chest pain and more recently due to pneumonia.     She continues daily oral iron with orange  juice.  Appetite is 75% energy is 50%.  Denies any pain.  Reports a significant change in her diet following recent hospitalization including a heart healthy low-salt diet.  Janet states it is hard to find a balance between what she wants to eat and what she should be eating.   ASSESSMENT   1.  History of stage I right-sided breast cancer (2019) - She had a mammogram on 02/16/2018 which showed B RADS category 0 incomplete.  She was referred for a biopsy. - Right breast biopsy on 02/22/2018 consistent with IDC. - Right lumpectomy on 04/08/2018, IDC, 1.1 cm, grade 1, margins negative, ER/PR positive, HER2 negative, Ki-67 1%. - She declined adjuvant radiation therapy. - Letrozole started on 05/28/2018, which she is tolerating well  - BCI testing (see Media file from 10/22/2022) showed NO benefit from endocrine therapy extended beyond 5 years, as she has 1.9% risk of late distant recurrence regardless of whether 5 to 10 years of aromatase inhibitor treatment is given.   2.  PANCYTOPENIA - Leukopenia and thrombocytopenia since 2015 in addition to anemia that is in part related to CKD, relative iron deficiency, and B12 deficiency. - Moderate to high suspicion for MDS or other infiltrative bone marrow disorder, but bone marrow biopsy has been declined. - CT abdomen/pelvis (01/16/2021): Normal-sized spleen.  Liver without any noted abnormalities.   - Folate normal.  Borderline B12 deficiency noted on 12/04/2021 with normal B12 but elevated methylmalonic acid 605 - Rheumatoid factor and ANA negative. - History of acute blood loss anemia requiring 2 units PRBC in January 2023. - EGD (09/01/2021) showed mild chronic gastritis and nonbleeding duodenal ulcer - She is taking ferrous sulfate 325 mg and vitamin B12 500 mcg daily.  - No bright red blood per rectum or melena. Admits to easy bruising, but denies petechial rash.  No B symptoms, frequent infections, or lymphadenopathy. - Labs from 04/06/2023 show  severe worsening of anemia with Hgb 8.4/MCV 88.4.  (Labs from PCP on 04/17/2023 showed platelets of 66, but with clumping/aggregation of platelets) Normal ferritin 287, iron saturation 14%. Vitamin B12 1088, MMA elevated 669. Normal LDH.  Creatinine 1.76/GFR 28.  -3.  Osteopenia - She is at risk for breast cancer associated bone loss in the setting of aromatase inhibitors since 2019 - DEXA scan done 03/07/2019 showed T-score -1.0 - DEXA scan from 04/23/2021 via [facility] was consistent with osteopenia (T-score -1.7) - Patient takes vitamin D and calcium supplements at home, with most recent vitamin D normal at 59.8 (09/30/2022) - FRAX from 04/23/2021 showed 10-year probability of hip fracture at 10.3% with 10-year risk of major osteoporotic fracture 17.2%, therefore started on Prolia in February 2024.  PLAN:  1. Malignant neoplasm of lower-outer quadrant of right breast of female, estrogen receptor positive (HCC) -Most recent mammogram (03/24/2023): BI-RADS Category 2, benign.  No evidence of breast malignancy in either breast. - History/ROS negative for any "red flag" symptoms of recurrent breast cancer - Most recent CMP (06/04/2023) showed normal LFTs. -She will complete letrozole at the end of this month. -Next mammogram will be due in August 2025.  2. Other pancytopenia (HCC) -Labs from 06/04/2023 show hemoglobin of 8.2, WBC 3.9 with normal platelet counts.  Differential was essentially unremarkable. -Etiology felt to be likely due to underlying MDS and anemia in the setting of CKD, relative iron deficiency and  B12 deficiency. -Previously discussed bone marrow biopsy but given advanced age and comorbidities they have declined this at this time. -Plan is to initiate ESA treatment with Retacrit 10,000 units every 2 weeks which was started on 04/22/2023. -Although labs have stabilized, would like to increase Retacrit from 10,000 units to 20,000 units starting in 2 weeks to see if this improves her  hemoglobin.  -Continue iron tablets with orange juice and vitamin B12 500 mcg daily. -Return to clinic every 2 weeks with labs (cbc, cmp) +/- Retacrit and in 8 weeks for labs (CBC, CMP, LDH, ferritin, iron/TIBC) and office visit.   3. Osteopenia due to cancer therapy -Most recent Prolia is from 04/22/2023.  Next due in March 2025. -Next bone density is scheduled for 07/10/2023. -Recommend she continue calcium, vitamin D supplements with weightbearing exercises as she can tolerate.  4.  Hyponatremia: -Sodium level 131 on recent lab draw with elevated creatinine 1.55. -Recently hospitalized for CHF and pneumonia and was instructed at discharge to reduce salt intake.  She is also on torsemide 40 mg each day.  She drinks plenty of fluids. -She has stopped eating salt since she was discharged. -We discussed incorporating some salt into her diet which is not adding additional and choosing foods that are heart healthy. -We will check her kidney function and sodium level every [redacted] weeks along with her CBC.    PLAN SUMMARY: >> Recommend increasing Retacrit from 10,000 units to 20,000 units starting next visit.  Proceed with 10,000 units today. >>RTC every 2 weeks for CBC/CMP plus or minus Retacrit. >> Same-day labs (CBC/D, CMP, LDH, ferritin, iron/TIBC, BB sample) + injection + OFFICE visit in 6-8 weeks.  - Next Prolia due March 2025 - Next mammogram and breast exam due August 2025    REVIEW OF SYSTEMS:   Review of Systems  Constitutional:  Positive for fatigue.  Respiratory:  Positive for cough and shortness of breath.     PHYSICAL EXAM:   Performance status (ECOG): 2 - Symptomatic, <50% confined to bed  There were no vitals filed for this visit. Wt Readings from Last 3 Encounters:  05/21/23 105 lb 13.1 oz (48 kg)  05/19/23 106 lb (48.1 kg)  05/14/23 108 lb (49 kg)   Physical Exam Constitutional:      Appearance: Normal appearance.  Cardiovascular:     Rate and Rhythm: Normal rate  and regular rhythm.  Pulmonary:     Effort: Pulmonary effort is normal.     Breath sounds: Normal breath sounds.  Abdominal:     General: Bowel sounds are normal.     Palpations: Abdomen is soft.  Musculoskeletal:        General: No swelling. Normal range of motion.  Neurological:     Mental Status: She is alert and oriented to person, place, and time. Mental status is at baseline.     PAST MEDICAL/SURGICAL HISTORY:  Past Medical History:  Diagnosis Date   Anemia    Arthritis    Asthma    Atrial fibrillation (HCC)    Basal cell carcinoma 02/06/2009   Left ear targus- ([facility])   Basal cell carcinoma 09/28/2008   Right back-(CX35FU)   CKD (chronic kidney disease), stage III (HCC)    Heart murmur    Hyperlipidemia    Hypertension    Hypothyroidism    Nodule of right lung    Right upper lobe   Osteopenia due to cancer therapy 10/07/2022   Pneumonia    PONV (postoperative nausea  and vomiting)    Renal insufficiency    Chronic   Renal vascular disease    Right renal artery stenosis (HCC) 05/09/2015   Squamous cell carcinoma of skin 09/07/2019   KA-Left shin-txpbx   Vertigo    Past Surgical History:  Procedure Laterality Date   BIOPSY  04/09/2021   Procedure: BIOPSY;  Surgeon: Dolores Frame, [name];  [facility]: [facility];  Service: Gastroenterology;;   BREAST LUMPECTOMY WITH RADIOACTIVE SEED LOCALIZATION Right 04/08/2018   Procedure: BREAST LUMPECTOMY WITH RADIOACTIVE SEED LOCALIZATION;  Surgeon: Emelia Loron, [name];  [name];  Service: General;  Laterality: Right;   COLONOSCOPY  2016   diverticulosis, hemorrhoids   ESOPHAGOGASTRODUODENOSCOPY  2016   normal   ESOPHAGOGASTRODUODENOSCOPY (EGD) WITH PROPOFOL N/A 04/09/2021   Procedure: ESOPHAGOGASTRODUODENOSCOPY (EGD) WITH PROPOFOL;  Surgeon: Dolores Frame, [name];  Location: [facility];  Service: Gastroenterology;  Laterality: N/A;  12:45   ESOPHAGOGASTRODUODENOSCOPY (EGD) WITH PROPOFOL N/A  09/01/2021   Procedure: ESOPHAGOGASTRODUODENOSCOPY (EGD) WITH PROPOFOL;  Surgeon: Lanelle Bal, DO;  Location: [facility];  Service: Endoscopy;  Laterality: N/A;   IR GENERIC HISTORICAL  08/29/2016   IR US GUIDE VASC ACCESS RIGHT 08/29/2016 [facility]   IR GENERIC HISTORICAL  08/29/2016   IR RENAL BILAT S&I MOD SED 08/29/2016 [facility]   KNEE ARTHROSCOPY Right    2019   THYROIDECTOMY, PARTIAL Right 1981   middle lobe removed 1st, then right lobe removed 7-8 years later (approx. 1988)    SOCIAL HISTORY:  Social History   Socioeconomic History   Marital status: Widowed    Spouse name: Not on file   Number of children: 2   Years of education: 65   Highest education level: Not on file  Occupational History   Occupation: Retired  Tobacco Use   Smoking status: Never   Smokeless tobacco: Never  Vaping Use   Vaping status: Never Used  Substance and Sexual Activity   Alcohol use: No   Drug use: No   Sexual activity: Not Currently  Other Topics Concern   Not on file  Social History Narrative   Lives alone, but her daughter stays there at night   Ambidextrous (writes with left hand).   No caffeine use.   Social Determinants of Health   Financial Resource Strain: Low Risk  (12/15/2022)   Overall Financial Resource Strain (CARDIA)    Difficulty of Paying Living Expenses: Not hard at all  Food Insecurity: No Food Insecurity (05/28/2023)   Hunger Vital Sign    Worried About Running Out of Food in the Last Year: Never true    Ran Out of Food in the Last Year: Never true  Transportation Needs: No Transportation Needs (05/28/2023)   PRAPARE - Administrator, Civil Service (Medical): No    Lack of Transportation (Non-Medical): No  Physical Activity: Inactive (12/15/2022)   Exercise Vital Sign    Days of Exercise per Week: 0 days    Minutes of Exercise per Session: 0 min  Stress: No Stress Concern Present (12/15/2022)   [employer] of Occupational Health  - Occupational Stress Questionnaire    Feeling of Stress : Not at all  Social Connections: Moderately Isolated (12/15/2022)   Social Connection and Isolation Panel [NHANES]    Frequency of Communication with Friends and Family: More than three times a week    Frequency of Social Gatherings with Friends and Family: Three times a week    Attends Religious Services:  More than 4 times per year    Active Member of Clubs or Organizations: No    Attends Banker Meetings: Never    Marital Status: Widowed  Intimate Partner Violence: Not At Risk (05/22/2023)   Humiliation, Afraid, Rape, and Kick questionnaire    Fear of Current or Ex-Partner: No    Emotionally Abused: No    Physically Abused: No    Sexually Abused: No    FAMILY HISTORY:  Family History  Problem Relation Age of Onset   Thyroid disease Mother    Stroke Father    Hypertension Brother    Lung cancer Maternal Uncle    Cancer Maternal Grandmother        Liver   Lung cancer Maternal Uncle    Hypertension Daughter    Hypercholesterolemia Daughter    Hypercholesterolemia Daughter     CURRENT MEDICATIONS:  Current Outpatient Medications  Medication Sig Dispense Refill   atorvastatin (LIPITOR) 20 MG tablet TAKE 1/2 TABLET BY MOUTH DAILY (Patient taking differently: Take 10 mg by mouth at bedtime.) 45 tablet 1   azelastine (ASTELIN) 0.1 % nasal spray Place 1 spray into both nostrils 2 (two) times daily. For runny nose 30 mL 12   bisoprolol (ZEBETA) 5 MG tablet TAKE 1 TABLET (5 MG TOTAL) BY MOUTH DAILY. 90 tablet 3   Cholecalciferol (VITAMIN D-3 PO) Take 1 tablet by mouth at bedtime.     Cyanocobalamin (VITAMIN B-12 PO) Take 1 tablet by mouth at bedtime.     feeding supplement (ENSURE ENLIVE / ENSURE PLUS) LIQD Take 237 mLs by mouth 2 (two) times daily between meals. (Patient taking differently: Take 237 mLs by mouth daily as needed (decreased appetite).) 237 mL 12   ferrous sulfate 325 (65 FE) MG tablet Take 325 mg by  mouth daily with breakfast.     flecainide (TAMBOCOR) 50 MG tablet TAKE 1 TABLET BY MOUTH TWICE A DAY 180 tablet 3   hydrALAZINE (APRESOLINE) 50 MG tablet Take 1 tablet (50 mg total) by mouth 2 (two) times daily. 60 tablet 3   letrozole (FEMARA) 2.5 MG tablet TAKE 1 TABLET BY MOUTH EVERY DAY 90 tablet 4   levothyroxine (SYNTHROID) 75 MCG tablet TAKE 1 TABLET BY MOUTH EVERY DAY (Patient taking differently: Take 75 mcg by mouth daily before breakfast.) 90 tablet 3   mirtazapine (REMERON) 7.5 MG tablet TAKE 1 TABLET BY MOUTH EVERYDAY AT BEDTIME 90 tablet 3   Multiple Vitamins-Minerals (MULTIVITAMIN WOMEN 50+) TABS Take 1 tablet by mouth at bedtime.     pantoprazole (PROTONIX) 40 MG tablet TAKE 1 TABLET (40 MG TOTAL) BY MOUTH TWICE A DAY BEFORE MEALS (Patient taking differently: Take 80 mg by mouth daily.) 180 tablet 1   torsemide (DEMADEX) 20 MG tablet Take 2 tablets (40 mg total) by mouth daily. Take extra 20mg  if gained more than 3 pounds overnight and/or more than 5 pounds in a week 60 tablet 1   No current facility-administered medications for this visit.    ALLERGIES:  Allergies  Allergen Reactions   Norvasc [Amlodipine] Swelling    Edema    Tape Other (See Comments)    Has to have no stick tape.   Zestril [Lisinopril] Cough   Hytrin [Terazosin] Hives and Nausea Only   Imdur [Isosorbide Nitrate] Nausea Only   Sulfonamide Derivatives Nausea And Vomiting and Rash    LABORATORY DATA:  I have reviewed the labs as listed.     Latest Ref Rng & Units 06/04/2023  12:06 PM 05/27/2023    4:58 AM 05/26/2023    4:34 AM  CBC  WBC 4.0 - 10.5 K/uL 3.9  9.4  8.6   Hemoglobin 12.0 - 15.0 g/dL 8.2  8.0  7.7   Hematocrit 36.0 - 46.0 % 26.1  25.5  25.9   Platelets 150 - 400 K/uL 155  119  102       Latest Ref Rng & Units 06/04/2023   12:06 PM 05/27/2023    4:58 AM 05/26/2023    4:34 AM  CMP  Glucose 70 - 99 mg/dL 409  811  914   BUN 8 - 23 mg/dL 31  49  49   Creatinine 0.44 - 1.00 mg/dL  7.82  9.56  2.13   Sodium 135 - 145 mmol/L 131  135  136   Potassium 3.5 - 5.1 mmol/L 4.2  3.9  3.8   Chloride 98 - 111 mmol/L 85  91  92   CO2 22 - 32 mmol/L 36  34  34   Calcium 8.9 - 10.3 mg/dL 8.3  8.7  8.3   Total Protein 6.5 - 8.1 g/dL 6.7     Total Bilirubin 0.3 - 1.2 mg/dL 0.8     Alkaline Phos 38 - 126 U/L 53     AST 15 - 41 U/L 22     ALT 0 - 44 U/L 15       DIAGNOSTIC IMAGING:  I have independently reviewed the scans and discussed with the patient. DG Chest Port 1 View  Result Date: 05/23/2023 CLINICAL DATA:  89 year old female with history of shortness of breath. EXAM: PORTABLE CHEST 1 VIEW COMPARISON:  Chest x-ray 05/21/2023. FINDINGS: Lung volumes are normal. Diffuse interstitial prominence, widespread peribronchial cuffing and worsening patchy multifocal somewhat nodular appearing airspace disease. Overall, aeration has worsened, particularly throughout the right mid to lower lung. Probable trace bilateral pleural effusions. No pneumothorax. Pulmonary vasculature appears engorged. Heart size is mildly enlarged. Atherosclerotic calcifications are noted in the thoracic aorta. IMPRESSION: 1. Worsening aeration in the lungs, favored to reflect evolving multilobar bilateral bronchopneumonia, although some degree of pulmonary edema from congestive heart failure is not excluded. 2. Trace bilateral pleural effusions. 3. Mild cardiomegaly. 4. Aortic atherosclerosis. Electronically Signed   By: Trudie Reed M.D.   On: 05/23/2023 10:26   DG Chest 2 View  Result Date: 05/21/2023 CLINICAL DATA:  Fever and productive cough and weakness for 2 days. Elevated white cell count yesterday. EXAM: CHEST - 2 VIEW COMPARISON:  05/05/2023 FINDINGS: Cardiac enlargement. Patchy infiltrates in the lungs bilaterally, progressing since prior study, suggesting multifocal pneumonia. Small left pleural effusion. No pneumothorax. Mediastinal contours appear intact. IMPRESSION: Developing patchy pulmonary  infiltrates bilaterally, greater on the left, likely pneumonia. Cardiac enlargement. Electronically Signed   By: Burman Nieves M.D.   On: 05/21/2023 21:05     WRAP UP:  All questions were answered. The patient knows to call the clinic with any problems, questions or concerns.  Medical decision making: Moderate  Time spent on visit:I spent 25 minutes dedicated to the care of this patient (face-to-face and non-face-to-face) on the date of the encounter to include what is described in the assessment and plan.  Mauro Kaufmann, NP  06/04/23 1:00 PM

## 2023-06-04 NOTE — Patient Instructions (Signed)
[facility]  Discharge Instructions: Thank you for choosing [facility] to provide your oncology and hematology care.  If you have a lab appointment with the Cancer Center - please note that after April 8th, 2024, all labs will be drawn in the cancer center.  You do not have to check in or register with the main entrance as you have in the past but will complete your check-in in the cancer center.  Wear comfortable clothing and clothing appropriate for easy access to any Portacath or PICC line.   We strive to give you quality time with your provider. You may need to reschedule your appointment if you arrive late (15 or more minutes).  Arriving late affects you and other patients whose appointments are after yours.  Also, if you miss three or more appointments without notifying the office, you may be dismissed from the clinic at the provider's discretion.      For prescription refill requests, have your pharmacy contact our office and allow 72 hours for refills to be completed.    Today you received the following chemotherapy and/or immunotherapy agents Retacrit      To help prevent nausea and vomiting after your treatment, we encourage you to take your nausea medication as directed.  BELOW ARE SYMPTOMS THAT SHOULD BE REPORTED IMMEDIATELY: *FEVER GREATER THAN 100.4 F (38 C) OR HIGHER *CHILLS OR SWEATING *NAUSEA AND VOMITING THAT IS NOT CONTROLLED WITH YOUR NAUSEA MEDICATION *UNUSUAL SHORTNESS OF BREATH *UNUSUAL BRUISING OR BLEEDING *URINARY PROBLEMS (pain or burning when urinating, or frequent urination) *BOWEL PROBLEMS (unusual diarrhea, constipation, pain near the anus) TENDERNESS IN MOUTH AND THROAT WITH OR WITHOUT PRESENCE OF ULCERS (sore throat, sores in mouth, or a toothache) UNUSUAL RASH, SWELLING OR PAIN  UNUSUAL VAGINAL DISCHARGE OR ITCHING   Items with * indicate a potential emergency and should be followed up as soon as possible or go to the  Emergency Department if any problems should occur.  Please show the CHEMOTHERAPY ALERT CARD or IMMUNOTHERAPY ALERT CARD at check-in to the Emergency Department and triage nurse.  Should you have questions after your visit or need to cancel or reschedule your appointment, please contact [facility] [phone]  and follow the prompts.  Office hours are 8:00 a.m. to 4:30 p.m. Monday - Friday. Please note that voicemails left after 4:00 p.m. may not be returned until the following business day.  We are closed weekends and major holidays. You have access to a nurse at all times for urgent questions. Please call the main number to the clinic [phone] and follow the prompts.  For any non-urgent questions, you may also contact your provider using MyChart. We now offer e-Visits for anyone 108 and older to request care online for non-urgent symptoms. For details visit [email or web address].   Also download the MyChart app! Go to the app store, search "MyChart", open the app, select [place], and log in with your MyChart username and password.

## 2023-06-04 NOTE — Progress Notes (Signed)
Patient presents today for Retacrit injection per providers order.  Vital signs and labs reviewed by NP.  Message received from Durenda Hurt NP/Dr. Ellin Saba patient okay for injection.  Stable during administration without incident; injection site WNL; see MAR for injection details.  Patient tolerated procedure well and without incident.  No questions or complaints noted at this time.

## 2023-06-08 NOTE — Progress Notes (Unsigned)
Cardiology Office Note:   Date:  06/10/2023  ID:  [name], DOB [birth year], MRN [id number] PCP: [name], DO  [facility] Providers Cardiologist:  Rollene Rotunda, MD {  History of Present Illness:   [name] is a 89 y.o. female  with difficult to control with chronic diastolic CHF, and arrhythmias.  She was in the hospital with hypoxic respiratory failure in October 2023.   I reviewed these records for this visit.  She had pneumonia and a CHF exacerbation.   She had mild AKI with her creat being 1.56 at discharge which was down from 1.93.   She was discharged on O2.  She was again in the hospital on January 2024 with pneumonia. She was sent home on 2 L of oxygen again.  She had some acute on chronic renal insufficiency with a peak creatinine 1.93 and she was mildly hyperkalemic.  He did have follow-up labs yesterday and creatinine is back down to baseline 1.67 and she was not hyperkalemic.  She does have some occasional oxygen.   Since I last saw her she was again in the hospital with acute SOB.  She was in the hospital with community-acquired pneumonia in July.  I reviewed these records for this visit.  She also had acute renal insufficiency with a peak creatinine 2.3.  This went back down to baseline about 1.5.  She had some acute on chronic systolic heart failure and her Lasix at discharge was changed to torsemide.  She had follow-up labs and they have been okay.  She had previously discontinued the home oxygen but was sent back home on oxygen and is now wearing this 24/7 with concentrator.  She says she actually feels pretty good.  She is not having any PND or orthopnea.  She is not having any palpitations, presyncope or syncope.  She has had no weight gain or edema.  She is eating a little bit better.  ROS: As stated in the HPI and negative for all other systems.  Studies Reviewed:    EKG:   Sinus rhythm, rate 75, axis within normal limits, QT prolonged, RSR  prime V1 and V2, no acute ST-T wave changes, 05/21/2023  Risk Assessment/Calculations:         Physical Exam:   VS:  BP (!) 160/62   Pulse (!) 52   Ht 5\' 4"  (1.626 m)   Wt 105 lb (47.6 kg)   BMI 18.02 kg/m    Wt Readings from Last 3 Encounters:  06/10/23 105 lb (47.6 kg)  05/21/23 105 lb 13.1 oz (48 kg)  05/19/23 106 lb (48.1 kg)     GEN: Well nourished, well developed in no acute distress NECK: No JVD; No carotid bruits CARDIAC: RRR, 3 out of 6 apical systolic murmur radiating out the aortic outflow tract ending late in systole, no diastolic murmurs, rubs, gallops RESPIRATORY:  Clear to auscultation without rales, wheezing or rhonchi  ABDOMEN: Soft, non-tender, non-distended EXTREMITIES:  No edema; No deformity   ASSESSMENT AND PLAN:   HFpEF:   She seems to be euvolemic and doing well on the current diuretic regimen.  Labs are up-to-date.  No change in therapy.  Atrial tachycardia/PAF:   She has had no symptomatic tachy paroxysms.  No change in therapy.  She has had no further sustained tachyarrhythmias.  No change in therapy.   HTN : Her blood pressure is at target.  No change in therapy.   Thrombocytopenia/leukopenia :   This  has been followed in the past by hematology and her primary provider.   Ao Valve stenosis: This was moderate AS to severe on echo in August 2024.  I had a long discussion with the patient and her daughter about this in the past.  She would be very high risk even for TAVR.  She is a DNR with multiple comorbidities.  She is doing well symptomatically and would prefer conservative therapy.    MR:   Mild MR. As above I will follow this conservatively.   CKD IIIB:     Creat was 1.55 most recently.  No change in therapy.        Follow up with me in 6 months  Signed, Rollene Rotunda, MD

## 2023-06-10 ENCOUNTER — Ambulatory Visit: Payer: PPO

## 2023-06-10 ENCOUNTER — Ambulatory Visit: Payer: PPO | Admitting: Cardiology

## 2023-06-10 ENCOUNTER — Encounter: Payer: Self-pay | Admitting: Cardiology

## 2023-06-10 ENCOUNTER — Ambulatory Visit: Payer: Self-pay | Admitting: *Deleted

## 2023-06-10 VITALS — BP 160/62 | HR 52 | Ht 64.0 in | Wt 105.0 lb

## 2023-06-10 DIAGNOSIS — I5032 Chronic diastolic (congestive) heart failure: Secondary | ICD-10-CM | POA: Diagnosis not present

## 2023-06-10 DIAGNOSIS — N1832 Chronic kidney disease, stage 3b: Secondary | ICD-10-CM | POA: Diagnosis not present

## 2023-06-10 DIAGNOSIS — I480 Paroxysmal atrial fibrillation: Secondary | ICD-10-CM | POA: Diagnosis not present

## 2023-06-10 DIAGNOSIS — I10 Essential (primary) hypertension: Secondary | ICD-10-CM | POA: Diagnosis not present

## 2023-06-10 DIAGNOSIS — J189 Pneumonia, unspecified organism: Secondary | ICD-10-CM

## 2023-06-10 DIAGNOSIS — I4719 Other supraventricular tachycardia: Secondary | ICD-10-CM | POA: Diagnosis not present

## 2023-06-10 DIAGNOSIS — J9621 Acute and chronic respiratory failure with hypoxia: Secondary | ICD-10-CM | POA: Diagnosis not present

## 2023-06-10 DIAGNOSIS — N179 Acute kidney failure, unspecified: Secondary | ICD-10-CM

## 2023-06-10 DIAGNOSIS — I130 Hypertensive heart and chronic kidney disease with heart failure and stage 1 through stage 4 chronic kidney disease, or unspecified chronic kidney disease: Secondary | ICD-10-CM | POA: Diagnosis not present

## 2023-06-10 DIAGNOSIS — N184 Chronic kidney disease, stage 4 (severe): Secondary | ICD-10-CM | POA: Diagnosis not present

## 2023-06-10 DIAGNOSIS — I5033 Acute on chronic diastolic (congestive) heart failure: Secondary | ICD-10-CM

## 2023-06-10 DIAGNOSIS — I350 Nonrheumatic aortic (valve) stenosis: Secondary | ICD-10-CM | POA: Diagnosis not present

## 2023-06-10 DIAGNOSIS — J9622 Acute and chronic respiratory failure with hypercapnia: Secondary | ICD-10-CM | POA: Diagnosis not present

## 2023-06-10 DIAGNOSIS — D631 Anemia in chronic kidney disease: Secondary | ICD-10-CM | POA: Diagnosis not present

## 2023-06-10 DIAGNOSIS — I340 Nonrheumatic mitral (valve) insufficiency: Secondary | ICD-10-CM | POA: Diagnosis not present

## 2023-06-10 NOTE — Patient Instructions (Signed)
Visit Information  Thank you for taking time to visit with me today. Please don't hesitate to contact me if I can be of assistance to you.   Following are the goals we discussed today:   Goals Addressed             This Visit's Progress    decrease re admission risks -RN CM coordination services   On track    Interventions Today    Flowsheet Row Most Recent Value  Chronic Disease   Chronic disease during today's visit Congestive Heart Failure (CHF), Other  [pneumonia home therapy]  General Interventions   General Interventions Discussed/Reviewed General Interventions Reviewed, Doctor Visits  Doctor Visits Discussed/Reviewed Doctor Visits Reviewed, PCP, Specialist  PCP/Specialist Visits Compliance with follow-up visit  Exercise Interventions   Exercise Discussed/Reviewed Exercise Reviewed, Physical Activity  Physical Activity Discussed/Reviewed Physical Activity Reviewed, Types of exercise  [home health]  Education Interventions   Education Provided Provided Education  Provided Verbal Education On Community Resources  [home health therapy]  Mental Health Interventions   Mental Health Discussed/Reviewed Coping Strategies, Mental Health Reviewed              Our next appointment is by telephone on 07/13/23 at 1 pm  Please call the care guide team at [phone] if you need to cancel or reschedule your appointment.   If you are experiencing a Mental Health or Behavioral Health Crisis or need someone to talk to, please call the Suicide and Crisis Lifeline: 988 call the [place] National Suicide Prevention Lifeline: 564-338-1886 or TTY: (520)514-1280 TTY (918)803-5414) to talk to a trained counselor call 1-800-273-TALK (toll free, 24 hour hotline) call the [facility]: 623-496-7630 call 911   Patient verbalizes understanding of instructions and care plan provided today and agrees to view in MyChart. Active MyChart status and patient understanding of how to  access instructions and care plan via MyChart confirmed with patient.     The patient has been provided with contact information for the care management team and has been advised to call with any health related questions or concerns.   Aqil Goetting L. Noelle Penner, RN, BSN, [facility]  VBCI Care Management Coordinator  [phone]  Fax: [phone]

## 2023-06-10 NOTE — Patient Instructions (Signed)
Medication Instructions:  The current medical regimen is effective;  continue present plan and medications.  *If you need a refill on your cardiac medications before your next appointment, please call your pharmacy*  Follow-Up: At [facility], you and your health needs are our priority.  As part of our continuing mission to provide you with exceptional heart care, we have created designated Provider Care Teams.  These Care Teams include your primary Cardiologist (physician) and Advanced Practice Providers (APPs -  Physician Assistants and Nurse Practitioners) who all work together to provide you with the care you need, when you need it.  We recommend signing up for the patient portal called "MyChart".  Sign up information is provided on this After Visit Summary.  MyChart is used to connect with patients for Virtual Visits (Telemedicine).  Patients are able to view lab/test results, encounter notes, upcoming appointments, etc.  Non-urgent messages can be sent to your provider as well.   To learn more about what you can do with MyChart, go to [email or web address].    Your next appointment:   6 month(s)  Provider:   James Hochrein, MD    

## 2023-06-10 NOTE — Patient Outreach (Signed)
Care Coordination   Follow Up Visit Note   06/10/2023 Name: [name] MRN: [id number] DOB: 10-27-1933  [name] is a 89 y.o. year old female who sees [name], DO for primary care. I spoke with  [name] by phone today.  What matters to the patients health and wellness today?  Recovering from "double pneumonia" She states she is doing much better. No audible congestion heard today.   She continues to benefit from home health therapy via [facility]. Mobility is good   Care Coordination    Follow Up Visit Note     06/03/2023 Name: [name]            MRN: [id number]       DOB: [birth year]   [name] is a 89 y.o. year old female who sees [name], DO for primary care. I spoke with  [name] by phone today.   What matters to the patients health and wellness today?  "Feeling a little better today" She was recently discharged home from hospital with pneumonia   Prior to this her daughter had encouraged calls to the patient home number. RN CM noticed this had been change in demographic. RN CM asked patient if RN CM needed to call [name] Daughter and the patient confirmed "No". Changed demographics to reflect this    Patient confirms since discharge that she has support in the home and her mobility is good. RN CM noted voice of a female in the background   She reported no worsening symptoms when assessment completed. No headache, chest pain, no dizziness, appetite improved, completed antibiotics, no pain        Goals Addressed             This Visit's Progress    decrease re admission risks -RN CM coordination services   On track    Interventions Today    Flowsheet Row Most Recent Value  Chronic Disease   Chronic disease during today's visit Congestive Heart Failure (CHF), Other  [pneumonia home therapy]  General Interventions   General Interventions Discussed/Reviewed General Interventions Reviewed, Doctor Visits  Doctor  Visits Discussed/Reviewed Doctor Visits Reviewed, PCP, Specialist  PCP/Specialist Visits Compliance with follow-up visit  Exercise Interventions   Exercise Discussed/Reviewed Exercise Reviewed, Physical Activity  Physical Activity Discussed/Reviewed Physical Activity Reviewed, Types of exercise  [home health]  Education Interventions   Education Provided Provided Education  Provided Verbal Education On Community Resources  [home health therapy]  Mental Health Interventions   Mental Health Discussed/Reviewed Coping Strategies, Mental Health Reviewed              SDOH assessments and interventions completed:  No     Care Coordination Interventions:  Yes, provided   Follow up plan: Follow up call scheduled for 07/13/23    Encounter Outcome:  Patient Visit Completed   Cala Bradford L. Noelle Penner, RN, BSN, [facility]  VBCI Care Management Coordinator  [phone]  Fax: [phone]

## 2023-06-12 ENCOUNTER — Encounter: Payer: Self-pay | Admitting: Family Medicine

## 2023-06-12 ENCOUNTER — Ambulatory Visit (INDEPENDENT_AMBULATORY_CARE_PROVIDER_SITE_OTHER): Payer: PPO | Admitting: Family Medicine

## 2023-06-12 VITALS — BP 149/76 | HR 60 | Temp 98.6°F | Ht 64.0 in | Wt 104.8 lb

## 2023-06-12 DIAGNOSIS — J189 Pneumonia, unspecified organism: Secondary | ICD-10-CM | POA: Diagnosis not present

## 2023-06-12 DIAGNOSIS — D72819 Decreased white blood cell count, unspecified: Secondary | ICD-10-CM

## 2023-06-12 DIAGNOSIS — Z09 Encounter for follow-up examination after completed treatment for conditions other than malignant neoplasm: Secondary | ICD-10-CM

## 2023-06-12 DIAGNOSIS — D649 Anemia, unspecified: Secondary | ICD-10-CM | POA: Diagnosis not present

## 2023-06-12 DIAGNOSIS — Z23 Encounter for immunization: Secondary | ICD-10-CM

## 2023-06-12 DIAGNOSIS — N179 Acute kidney failure, unspecified: Secondary | ICD-10-CM

## 2023-06-12 NOTE — Progress Notes (Signed)
Subjective: CC: Hospital discharge follow-up PCP: [name], DO LKG:MWNUU B Bosarge is a 89 y.o. female presenting to clinic today for:  1.  Hospital discharge follow-up for pneumonia and acute on chronic CHF Patient was found to have multifocal changes in the lung suggestive of pneumonia.  She was treated with antibiotics.  She presents today for hospital follow-up.  She continues to use oxygen continuously.  She reports her breathing has gotten a lot better.  She is working on Print production planner.  Denies any hemoptysis, fevers or dyspnea with exertion.  Would like to get her flu shot.  Has appoint with pulmonology coming up in the next couple of months and just saw cardiology.  She has been avoiding excess salts but notes that she does consume canned goods and tries to get the low-sodium versions of these   ROS: Per HPI  Allergies  Allergen Reactions   Norvasc [Amlodipine] Swelling    Edema    Tape Other (See Comments)    Has to have no stick tape.   Zestril [Lisinopril] Cough   Hytrin [Terazosin] Hives and Nausea Only   Imdur [Isosorbide Nitrate] Nausea Only   Sulfonamide Derivatives Nausea And Vomiting and Rash   Past Medical History:  Diagnosis Date   Anemia    Arthritis    Asthma    Atrial fibrillation (HCC)    Basal cell carcinoma 02/06/2009   Left ear targus- ([facility])   Basal cell carcinoma 09/28/2008   Right back-(CX35FU)   CKD (chronic kidney disease), stage III (HCC)    Heart murmur    Hyperlipidemia    Hypertension    Hypothyroidism    Nodule of right lung    Right upper lobe   Osteopenia due to cancer therapy 10/07/2022   Pneumonia    PONV (postoperative nausea and vomiting)    Renal insufficiency    Chronic   Renal vascular disease    Right renal artery stenosis (HCC) 05/09/2015   Squamous cell carcinoma of skin 09/07/2019   KA-Left shin-txpbx   Vertigo     Current Outpatient Medications:    atorvastatin (LIPITOR) 20 MG tablet, TAKE 1/2 TABLET  BY MOUTH DAILY (Patient taking differently: Take 10 mg by mouth at bedtime.), Disp: 45 tablet, Rfl: 1   azelastine (ASTELIN) 0.1 % nasal spray, Place 1 spray into both nostrils 2 (two) times daily. For runny nose, Disp: 30 mL, Rfl: 12   bisoprolol (ZEBETA) 5 MG tablet, TAKE 1 TABLET (5 MG TOTAL) BY MOUTH DAILY., Disp: 90 tablet, Rfl: 3   Cholecalciferol (VITAMIN D-3 PO), Take 1 tablet by mouth at bedtime., Disp: , Rfl:    Cyanocobalamin (VITAMIN B-12 PO), Take 1 tablet by mouth at bedtime., Disp: , Rfl:    feeding supplement (ENSURE ENLIVE / ENSURE PLUS) LIQD, Take 237 mLs by mouth 2 (two) times daily between meals. (Patient taking differently: Take 237 mLs by mouth daily as needed (decreased appetite).), Disp: 237 mL, Rfl: 12   ferrous sulfate 325 (65 FE) MG tablet, Take 325 mg by mouth daily with breakfast., Disp: , Rfl:    flecainide (TAMBOCOR) 50 MG tablet, TAKE 1 TABLET BY MOUTH TWICE A DAY, Disp: 180 tablet, Rfl: 3   hydrALAZINE (APRESOLINE) 50 MG tablet, Take 1 tablet (50 mg total) by mouth 2 (two) times daily., Disp: 60 tablet, Rfl: 3   letrozole (FEMARA) 2.5 MG tablet, TAKE 1 TABLET BY MOUTH EVERY DAY, Disp: 90 tablet, Rfl: 4   levothyroxine (SYNTHROID) 75 MCG tablet,  TAKE 1 TABLET BY MOUTH EVERY DAY (Patient taking differently: Take 75 mcg by mouth daily before breakfast.), Disp: 90 tablet, Rfl: 3   mirtazapine (REMERON) 7.5 MG tablet, TAKE 1 TABLET BY MOUTH EVERYDAY AT BEDTIME, Disp: 90 tablet, Rfl: 3   Multiple Vitamins-Minerals (MULTIVITAMIN WOMEN 50+) TABS, Take 1 tablet by mouth at bedtime., Disp: , Rfl:    pantoprazole (PROTONIX) 40 MG tablet, TAKE 1 TABLET (40 MG TOTAL) BY MOUTH TWICE A DAY BEFORE MEALS (Patient taking differently: Take 80 mg by mouth daily.), Disp: 180 tablet, Rfl: 1   torsemide (DEMADEX) 20 MG tablet, Take 2 tablets (40 mg total) by mouth daily. Take extra 20mg  if gained more than 3 pounds overnight and/or more than 5 pounds in a week, Disp: 60 tablet, Rfl: 1 Social  History   Socioeconomic History   Marital status: Widowed    Spouse name: Not on file   Number of children: 2   Years of education: 2   Highest education level: Not on file  Occupational History   Occupation: Retired  Tobacco Use   Smoking status: Never   Smokeless tobacco: Never  Vaping Use   Vaping status: Never Used  Substance and Sexual Activity   Alcohol use: No   Drug use: No   Sexual activity: Not Currently  Other Topics Concern   Not on file  Social History Narrative   Lives alone, but her daughter stays there at night   Ambidextrous (writes with left hand).   No caffeine use.   Social Determinants of Health   Financial Resource Strain: Low Risk  (12/15/2022)   Overall Financial Resource Strain (CARDIA)    Difficulty of Paying Living Expenses: Not hard at all  Food Insecurity: No Food Insecurity (05/28/2023)   Hunger Vital Sign    Worried About Running Out of Food in the Last Year: Never true    Ran Out of Food in the Last Year: Never true  Transportation Needs: No Transportation Needs (05/28/2023)   PRAPARE - Administrator, Civil Service (Medical): No    Lack of Transportation (Non-Medical): No  Physical Activity: Inactive (12/15/2022)   Exercise Vital Sign    Days of Exercise per Week: 0 days    Minutes of Exercise per Session: 0 min  Stress: No Stress Concern Present (12/15/2022)   [employer] of Occupational Health - Occupational Stress Questionnaire    Feeling of Stress : Not at all  Social Connections: Moderately Isolated (12/15/2022)   Social Connection and Isolation Panel [NHANES]    Frequency of Communication with Friends and Family: More than three times a week    Frequency of Social Gatherings with Friends and Family: Three times a week    Attends Religious Services: More than 4 times per year    Active Member of Clubs or Organizations: No    Attends Banker Meetings: Never    Marital Status: Widowed  Intimate  Partner Violence: Not At Risk (05/22/2023)   Humiliation, Afraid, Rape, and Kick questionnaire    Fear of Current or Ex-Partner: No    Emotionally Abused: No    Physically Abused: No    Sexually Abused: No   Family History  Problem Relation Age of Onset   Thyroid disease Mother    Stroke Father    Hypertension Brother    Lung cancer Maternal Uncle    Cancer Maternal Grandmother        Liver   Lung cancer Maternal Uncle  Hypertension Daughter    Hypercholesterolemia Daughter    Hypercholesterolemia Daughter     Objective: Office vital signs reviewed. BP (!) 149/76   Pulse 60   Temp 98.6 F (37 C)   Ht 5\' 4"  (1.626 m)   Wt 104 lb 12.8 oz (47.5 kg)   SpO2 93%   BMI 17.99 kg/m   Physical Examination:  General: Awake, alert, thin, frail-appearing elderly female, No acute distress HEENT: sclera white, MMM Cardio: regular rate and rhythm, S1S2 heard, + murmurs appreciated Pulm: Coarse breath sounds at the bases.  Otherwise clear to auscultation bilaterally, no wheezes, rhonchi or rales; normal work of breathing on O2 via nasal cannula    Assessment/ Plan: 89 y.o. female   Community acquired pneumonia, unspecified laterality  Hospital discharge follow-up  Leukopenia, unspecified type - Plan: CBC with Differential  Anemia, unspecified type - Plan: CBC with Differential  AKI (acute kidney injury) (HCC) - Plan: Basic Metabolic Panel  Encounter for immunization - Plan: Flu Vaccine Trivalent High Dose (Fluad)  Lungs slightly coarse but no appreciable wheezes.  No dyspnea with exertion.  Breathing normally on supplemental oxygen via nasal cannula.  I reviewed her discharge recommendations.  Will repeat BMP and CBC.  Continue to follow-up with cardiology as directed for monitoring of CHF.  Keep appointment with pulmonology  Now that she has been afebrile and off of antibiotics okay to utilize flu shot today to prevent further infections and hospitalizations.  Would like  to see her closely back in January  Orders Placed This Encounter  Procedures   Basic Metabolic Panel   CBC with Differential   No orders of the defined types were placed in this encounter.   [name], DO [facility] [phone]

## 2023-06-13 DIAGNOSIS — J449 Chronic obstructive pulmonary disease, unspecified: Secondary | ICD-10-CM | POA: Diagnosis not present

## 2023-06-13 LAB — CBC WITH DIFFERENTIAL/PLATELET
Basophils Absolute: 0 10*3/uL (ref 0.0–0.2)
Basos: 1 %
EOS (ABSOLUTE): 0 10*3/uL (ref 0.0–0.4)
Eos: 1 %
Hematocrit: 28.5 % — ABNORMAL LOW (ref 34.0–46.6)
Hemoglobin: 8.7 g/dL — CL (ref 11.1–15.9)
Immature Grans (Abs): 0 10*3/uL (ref 0.0–0.1)
Immature Granulocytes: 0 %
Lymphocytes Absolute: 0.6 10*3/uL — ABNORMAL LOW (ref 0.7–3.1)
Lymphs: 31 %
MCH: 27.1 pg (ref 26.6–33.0)
MCHC: 30.5 g/dL — ABNORMAL LOW (ref 31.5–35.7)
MCV: 89 fL (ref 79–97)
Monocytes Absolute: 0.2 10*3/uL (ref 0.1–0.9)
Monocytes: 10 %
Neutrophils Absolute: 1.1 10*3/uL — ABNORMAL LOW (ref 1.4–7.0)
Neutrophils: 57 %
RBC: 3.21 x10E6/uL — ABNORMAL LOW (ref 3.77–5.28)
RDW: 15.3 % (ref 11.7–15.4)
WBC: 1.9 10*3/uL — CL (ref 3.4–10.8)

## 2023-06-13 LAB — BASIC METABOLIC PANEL
BUN/Creatinine Ratio: 18 (ref 12–28)
BUN: 34 mg/dL — ABNORMAL HIGH (ref 8–27)
CO2: 35 mmol/L — ABNORMAL HIGH (ref 20–29)
Calcium: 9.1 mg/dL (ref 8.7–10.3)
Chloride: 89 mmol/L — ABNORMAL LOW (ref 96–106)
Creatinine, Ser: 1.87 mg/dL — ABNORMAL HIGH (ref 0.57–1.00)
Glucose: 80 mg/dL (ref 70–99)
Potassium: 4.1 mmol/L (ref 3.5–5.2)
Sodium: 137 mmol/L (ref 134–144)
eGFR: 26 mL/min/{1.73_m2} — ABNORMAL LOW (ref >59)

## 2023-06-16 ENCOUNTER — Other Ambulatory Visit: Payer: Self-pay

## 2023-06-16 DIAGNOSIS — Z170 Estrogen receptor positive status [ER+]: Secondary | ICD-10-CM

## 2023-06-16 DIAGNOSIS — N179 Acute kidney failure, unspecified: Secondary | ICD-10-CM

## 2023-06-16 NOTE — Progress Notes (Unsigned)
Lab orders entered

## 2023-06-17 ENCOUNTER — Inpatient Hospital Stay: Payer: PPO

## 2023-06-17 VITALS — BP 168/56 | HR 55 | Temp 97.5°F | Resp 18

## 2023-06-17 DIAGNOSIS — N183 Chronic kidney disease, stage 3 unspecified: Secondary | ICD-10-CM | POA: Diagnosis not present

## 2023-06-17 DIAGNOSIS — D649 Anemia, unspecified: Secondary | ICD-10-CM

## 2023-06-17 DIAGNOSIS — M8580 Other specified disorders of bone density and structure, unspecified site: Secondary | ICD-10-CM

## 2023-06-17 DIAGNOSIS — C50511 Malignant neoplasm of lower-outer quadrant of right female breast: Secondary | ICD-10-CM

## 2023-06-17 LAB — COMPREHENSIVE METABOLIC PANEL
ALT: 12 U/L (ref 0–44)
AST: 22 U/L (ref 15–41)
Albumin: 3.6 g/dL (ref 3.5–5.0)
Alkaline Phosphatase: 41 U/L (ref 38–126)
Anion gap: 7 (ref 5–15)
BUN: 33 mg/dL — ABNORMAL HIGH (ref 8–23)
CO2: 36 mmol/L — ABNORMAL HIGH (ref 22–32)
Calcium: 8.1 mg/dL — ABNORMAL LOW (ref 8.9–10.3)
Chloride: 87 mmol/L — ABNORMAL LOW (ref 98–111)
Creatinine, Ser: 1.55 mg/dL — ABNORMAL HIGH (ref 0.44–1.00)
GFR, Estimated: 32 mL/min — ABNORMAL LOW (ref >60)
Glucose, Bld: 98 mg/dL (ref 70–99)
Potassium: 3.8 mmol/L (ref 3.5–5.1)
Sodium: 130 mmol/L — ABNORMAL LOW (ref 135–145)
Total Bilirubin: 0.7 mg/dL (ref 0.3–1.2)
Total Protein: 6.8 g/dL (ref 6.5–8.1)

## 2023-06-17 LAB — CBC WITH DIFFERENTIAL/PLATELET
Abs Immature Granulocytes: 0.01 10*3/uL (ref 0.00–0.07)
Basophils Absolute: 0 10*3/uL (ref 0.0–0.1)
Basophils Relative: 1 %
Eosinophils Absolute: 0 10*3/uL (ref 0.0–0.5)
Eosinophils Relative: 2 %
HCT: 26.8 % — ABNORMAL LOW (ref 36.0–46.0)
Hemoglobin: 8.4 g/dL — ABNORMAL LOW (ref 12.0–15.0)
Immature Granulocytes: 1 %
Lymphocytes Relative: 27 %
Lymphs Abs: 0.5 10*3/uL — ABNORMAL LOW (ref 0.7–4.0)
MCH: 28.3 pg (ref 26.0–34.0)
MCHC: 31.3 g/dL (ref 30.0–36.0)
MCV: 90.2 fL (ref 80.0–100.0)
Monocytes Absolute: 0.2 10*3/uL (ref 0.1–1.0)
Monocytes Relative: 12 %
Neutro Abs: 1.1 10*3/uL — ABNORMAL LOW (ref 1.7–7.7)
Neutrophils Relative %: 57 %
Platelets: 92 10*3/uL — ABNORMAL LOW (ref 150–400)
RBC: 2.97 MIL/uL — ABNORMAL LOW (ref 3.87–5.11)
RDW: 16.3 % — ABNORMAL HIGH (ref 11.5–15.5)
WBC: 1.9 10*3/uL — ABNORMAL LOW (ref 4.0–10.5)
nRBC: 0 % (ref 0.0–0.2)

## 2023-06-17 LAB — SAMPLE TO BLOOD BANK

## 2023-06-17 MED ORDER — EPOETIN ALFA-EPBX 20000 UNIT/ML IJ SOLN
20000.0000 [IU] | Freq: Once | INTRAMUSCULAR | Status: AC
  Administered 2023-06-17: 20000 [IU] via SUBCUTANEOUS
  Filled 2023-06-17: qty 1

## 2023-06-17 NOTE — Progress Notes (Signed)
Presents today for Retacrit injection per providers order.

## 2023-06-17 NOTE — Patient Instructions (Signed)

## 2023-06-17 NOTE — Progress Notes (Signed)
Thank you for forwarding these results to [place], Dr. Nadine Counts!  Her labs remain consistent with pancytopenia concerning for possible underlying bone marrow disorder such as MDS.  At my last visit with [name], she was adamant that she did not want a bone marrow biopsy, but has elected for more conservative treatment with Retacrit injections to improve her blood count.  However, if her white blood cells and platelets continue to trend downward, I would once again discuss bone marrow biopsy with her, as she may have more serious bone marrow conditions brewing in the background.  We have increased the dose of her Retacrit injections as of today.  If her hemoglobin continues to remain low with future doses, we will continue to titrate this upward.  FYI, I have also sent this to the other APP at our office (NP Durenda Hurt).  I have recently stepped back to a part-time role, and Boneta Lucks is assuming care for several of our patients here, including [name].  Thanks as always for your excellent collaborative care!

## 2023-06-17 NOTE — Progress Notes (Signed)
Patient's Hgb 8.4 and blood pressure stable. Patient tolerated injection with no complaints voiced.  Site clean and dry with no bruising or swelling noted at site.  See MAR for details.  Band aid applied.  Patient stable during and after injection.  Vss with discharge and left in satisfactory condition with no s/s of distress noted. All follow ups as scheduled. Pt escorted via wheelchair to be discharged home by granddaughter.   [name] [employer]

## 2023-06-18 ENCOUNTER — Inpatient Hospital Stay: Payer: PPO

## 2023-06-19 ENCOUNTER — Inpatient Hospital Stay: Payer: PPO

## 2023-06-23 ENCOUNTER — Ambulatory Visit (HOSPITAL_COMMUNITY)
Admission: RE | Admit: 2023-06-23 | Discharge: 2023-06-23 | Disposition: A | Discharge status: Home / Self Care | Payer: PPO | Source: Ambulatory Visit | Attending: Pulmonary Disease | Admitting: Pulmonary Disease

## 2023-06-23 DIAGNOSIS — J849 Interstitial pulmonary disease, unspecified: Secondary | ICD-10-CM | POA: Insufficient documentation

## 2023-06-23 LAB — PULMONARY FUNCTION TEST
DL/VA % pred: 99 %
DL/VA: 4.05 ml/min/mmHg/L
DLCO cor % pred: 52 %
DLCO cor: 9.74 ml/min/mmHg
DLCO unc % pred: 40 %
DLCO unc: 7.62 ml/min/mmHg
FEF 25-75 Post: 0.44 L/s
FEF 25-75 Pre: 0.41 L/s
FEF2575-%Change-Post: 7 %
FEF2575-%Pred-Post: 39 %
FEF2575-%Pred-Pre: 36 %
FEV1-%Change-Post: 3 %
FEV1-%Pred-Post: 43 %
FEV1-%Pred-Pre: 42 %
FEV1-Post: 0.78 L
FEV1-Pre: 0.76 L
FEV1FVC-%Change-Post: 0 %
FEV1FVC-%Pred-Pre: 90 %
FEV6-%Change-Post: 5 %
FEV6-%Pred-Post: 52 %
FEV6-%Pred-Pre: 49 %
FEV6-Post: 1.19 L
FEV6-Pre: 1.12 L
FEV6FVC-%Change-Post: 2 %
FEV6FVC-%Pred-Post: 106 %
FEV6FVC-%Pred-Pre: 103 %
FVC-%Change-Post: 2 %
FVC-%Pred-Post: 49 %
FVC-%Pred-Pre: 47 %
FVC-Post: 1.19 L
FVC-Pre: 1.15 L
Post FEV1/FVC ratio: 66 %
Post FEV6/FVC ratio: 100 %
Pre FEV1/FVC ratio: 65 %
Pre FEV6/FVC Ratio: 97 %
RV % pred: 156 %
RV: 4.04 L
TLC % pred: 101 %
TLC: 5.29 L

## 2023-06-23 MED ORDER — ALBUTEROL SULFATE (2.5 MG/3ML) 0.083% IN NEBU
2.5000 mg | INHALATION_SOLUTION | Freq: Once | RESPIRATORY_TRACT | Status: AC
  Administered 2023-06-23: 2.5 mg via RESPIRATORY_TRACT

## 2023-06-23 NOTE — Progress Notes (Signed)
Thanks Lupe Carney noted!  Durenda Hurt, NP 06/23/2023 9:42 AM

## 2023-06-24 ENCOUNTER — Telehealth: Payer: Self-pay | Admitting: Pulmonary Disease

## 2023-06-24 NOTE — Telephone Encounter (Signed)
She states Dr. Vassie Loll was going to leave her a sample of an inhaler at [facility] but when her daughter came there they did not have it at the front desk. Please call PT to advise.

## 2023-06-25 NOTE — Telephone Encounter (Signed)
Spoke with patient and advised sample is in our office in [place], she was confused and thought it was at [facility]. Reminded patient of office location. She will have daughter come and pick up. Nothing further needed at this time.

## 2023-07-01 ENCOUNTER — Inpatient Hospital Stay: Payer: PPO

## 2023-07-01 ENCOUNTER — Inpatient Hospital Stay: Payer: PPO | Attending: Oncology

## 2023-07-01 VITALS — BP 158/56 | HR 59 | Temp 97.9°F | Resp 19

## 2023-07-01 DIAGNOSIS — D631 Anemia in chronic kidney disease: Secondary | ICD-10-CM | POA: Diagnosis not present

## 2023-07-01 DIAGNOSIS — M8580 Other specified disorders of bone density and structure, unspecified site: Secondary | ICD-10-CM | POA: Insufficient documentation

## 2023-07-01 DIAGNOSIS — N183 Chronic kidney disease, stage 3 unspecified: Secondary | ICD-10-CM | POA: Insufficient documentation

## 2023-07-01 DIAGNOSIS — D649 Anemia, unspecified: Secondary | ICD-10-CM

## 2023-07-01 DIAGNOSIS — C50511 Malignant neoplasm of lower-outer quadrant of right female breast: Secondary | ICD-10-CM | POA: Diagnosis not present

## 2023-07-01 DIAGNOSIS — Z79811 Long term (current) use of aromatase inhibitors: Secondary | ICD-10-CM | POA: Insufficient documentation

## 2023-07-01 DIAGNOSIS — Z170 Estrogen receptor positive status [ER+]: Secondary | ICD-10-CM | POA: Insufficient documentation

## 2023-07-01 LAB — CBC
HCT: 28.1 % — ABNORMAL LOW (ref 36.0–46.0)
Hemoglobin: 8.8 g/dL — ABNORMAL LOW (ref 12.0–15.0)
MCH: 28 pg (ref 26.0–34.0)
MCHC: 31.3 g/dL (ref 30.0–36.0)
MCV: 89.5 fL (ref 80.0–100.0)
Platelets: 107 10*3/uL — ABNORMAL LOW (ref 150–400)
RBC: 3.14 MIL/uL — ABNORMAL LOW (ref 3.87–5.11)
RDW: 15.9 % — ABNORMAL HIGH (ref 11.5–15.5)
WBC: 2.4 10*3/uL — ABNORMAL LOW (ref 4.0–10.5)
nRBC: 0 % (ref 0.0–0.2)

## 2023-07-01 LAB — SAMPLE TO BLOOD BANK

## 2023-07-01 MED ORDER — EPOETIN ALFA-EPBX 20000 UNIT/ML IJ SOLN
20000.0000 [IU] | Freq: Once | INTRAMUSCULAR | Status: AC
  Administered 2023-07-01: 20000 [IU] via SUBCUTANEOUS
  Filled 2023-07-01: qty 1

## 2023-07-01 NOTE — Progress Notes (Signed)
Pt.'s Hgb 8.8. Patient tolerated  Retacrit injection with no complaints voiced.  Site clean and dry with no bruising or swelling noted at site.  See MAR for details.  Band aid applied.  Patient stable during and after injection.  Vss with discharge and left in satisfactory condition with no s/s of distress noted. All follow ups as scheduled.   Luismario Coston [employer]

## 2023-07-01 NOTE — Patient Instructions (Signed)

## 2023-07-02 ENCOUNTER — Inpatient Hospital Stay: Payer: PPO

## 2023-07-03 ENCOUNTER — Inpatient Hospital Stay: Payer: PPO

## 2023-07-09 ENCOUNTER — Telehealth: Payer: Self-pay | Admitting: Family Medicine

## 2023-07-09 ENCOUNTER — Other Ambulatory Visit: Payer: Self-pay | Admitting: Gastroenterology

## 2023-07-09 DIAGNOSIS — N2581 Secondary hyperparathyroidism of renal origin: Secondary | ICD-10-CM | POA: Diagnosis not present

## 2023-07-09 DIAGNOSIS — N184 Chronic kidney disease, stage 4 (severe): Secondary | ICD-10-CM | POA: Diagnosis not present

## 2023-07-09 DIAGNOSIS — C50911 Malignant neoplasm of unspecified site of right female breast: Secondary | ICD-10-CM | POA: Diagnosis not present

## 2023-07-09 DIAGNOSIS — I129 Hypertensive chronic kidney disease with stage 1 through stage 4 chronic kidney disease, or unspecified chronic kidney disease: Secondary | ICD-10-CM | POA: Diagnosis not present

## 2023-07-09 DIAGNOSIS — D61818 Other pancytopenia: Secondary | ICD-10-CM | POA: Diagnosis not present

## 2023-07-09 DIAGNOSIS — D649 Anemia, unspecified: Secondary | ICD-10-CM | POA: Diagnosis not present

## 2023-07-09 DIAGNOSIS — I4891 Unspecified atrial fibrillation: Secondary | ICD-10-CM | POA: Diagnosis not present

## 2023-07-09 DIAGNOSIS — N1832 Chronic kidney disease, stage 3b: Secondary | ICD-10-CM | POA: Diagnosis not present

## 2023-07-09 DIAGNOSIS — M8580 Other specified disorders of bone density and structure, unspecified site: Secondary | ICD-10-CM | POA: Diagnosis not present

## 2023-07-09 DIAGNOSIS — I509 Heart failure, unspecified: Secondary | ICD-10-CM | POA: Diagnosis not present

## 2023-07-09 DIAGNOSIS — R1013 Epigastric pain: Secondary | ICD-10-CM

## 2023-07-09 NOTE — Telephone Encounter (Signed)
Per billing department, this documentation was faxed on 06/30/23 and has been sent to the scan center to be uploaded into the chart.

## 2023-07-09 NOTE — Telephone Encounter (Signed)
Call regarding Home Health.  Copied from CRM [id number]. Topic: Clinical - Home Health Verbal Orders >> Jul 09, 2023  9:20 AM Dollene Primrose wrote: Caller/Agency: [facility] Number: [phone] Service Requested: Calling to check status, has faxed HH orders x2. Frequency: n/a Any new concerns about the patient? no

## 2023-07-10 ENCOUNTER — Ambulatory Visit: Payer: PPO | Admitting: Family Medicine

## 2023-07-10 ENCOUNTER — Other Ambulatory Visit: Payer: PPO

## 2023-07-10 LAB — LAB REPORT - SCANNED: EGFR: 29

## 2023-07-13 ENCOUNTER — Ambulatory Visit: Payer: Self-pay | Admitting: *Deleted

## 2023-07-13 NOTE — Patient Instructions (Addendum)
Visit Information  Thank you for taking time to visit with me today. Please don't hesitate to contact me if I can be of assistance to you.   Following are the goals we discussed today:   Goals Addressed             This Visit's Progress    decrease re admission risks -RN CM coordination services   On track    Decrease re admission risks- 07/13/23 reports she continues to do well at home only with some shortness of breath with exertion, still uses her oxygen, and plans to get anemia injection "next week" Patient pending home health services 07/13/23 patient reports services are pending rescheduling   Interventions Today    Flowsheet Row Most Recent Value  Chronic Disease   Chronic disease during today's visit Congestive Heart Failure (CHF), Other  [[facility]. warm transfer of RN CM services]  General Interventions   General Interventions Discussed/Reviewed General Interventions Reviewed, Labs, Communication with  Labs --  [Discussed her hemoglobin of 8.8, anemia, fatigue]  Doctor Visits Discussed/Reviewed Doctor Visits Reviewed, PCP  PCP/Specialist Visits Compliance with follow-up visit  Communication with RN  [Warm transfer to Kelby Aline, RN CM]  Exercise Interventions   Exercise Discussed/Reviewed Exercise Reviewed, Physical Activity, Weight Managment  Physical Activity Discussed/Reviewed Physical Activity Reviewed  Pearletha Furl mobility reports she is remaining active at home]  Weight Management Weight maintenance  Education Interventions   Education Provided Provided Education  Provided Verbal Education On [employer], Labs  Labs Reviewed --  [Hemoglobin, Injection confirmed to be pending "next week"]  Mental Health Interventions   Mental Health Discussed/Reviewed Mental Health Reviewed, Coping Strategies  Nutrition Interventions   Nutrition Discussed/Reviewed Nutrition Reviewed  [denies concerns]  Pharmacy Interventions   Pharmacy Dicussed/Reviewed Pharmacy  Topics Reviewed, Affording Medications              Our next appointment is  pending new RN CM outreach  on pending at pending  Please call the care guide team at [phone] if you need to cancel or reschedule your appointment.   If you are experiencing a Mental Health or Behavioral Health Crisis or need someone to talk to, please call the Suicide and Crisis Lifeline: 988 call the [place] National Suicide Prevention Lifeline: 860-610-3697 or TTY: (430) 665-7277 TTY 747-541-5358) to talk to a trained counselor call 1-800-273-TALK (toll free, 24 hour hotline) call the [facility]: (775)644-2245 call 911   Patient verbalizes understanding of instructions and care plan provided today and agrees to view in MyChart. Active MyChart status and patient understanding of how to access instructions and care plan via MyChart confirmed with patient.     The patient has been provided with contact information for the care management team and has been advised to call with any health related questions or concerns.   Ziah Leandro L. Noelle Penner, RN, BSN, [facility]  VBCI Care Management Coordinator  [phone]  Fax: [phone]

## 2023-07-13 NOTE — Patient Outreach (Signed)
  Care Coordination   Follow Up Visit Note   07/13/2023 Name: [name] MRN: [id number] DOB: [birth year]  [name] is a 89 y.o. year old female who sees [name], DO for primary care. I spoke with  [name] by phone today.  What matters to the patients health and wellness today?  Home health services, infusion, low hemoglobin, home activity/exercise, shortness of breath Warm transfer to New RN CM Pam T     Goals Addressed             This Visit's Progress    decrease re admission risks -RN CM coordination services   On track    Decrease re admission risks- 07/13/23 reports she continues to do well at home only with some shortness of breath with exertion, still uses her oxygen, and plans to get anemia injection "next week" Patient pending home health services 07/13/23 patient reports services are pending rescheduling   Interventions Today    Flowsheet Row Most Recent Value  Chronic Disease   Chronic disease during today's visit Congestive Heart Failure (CHF), Other  [[facility]. warm transfer of RN CM services]  General Interventions   General Interventions Discussed/Reviewed General Interventions Reviewed, Labs, Communication with  Labs --  [Discussed her hemoglobin of 8.8, anemia, fatigue]  Doctor Visits Discussed/Reviewed Doctor Visits Reviewed, PCP  PCP/Specialist Visits Compliance with follow-up visit  Communication with RN  [Warm transfer to Kelby Aline, RN CM]  Exercise Interventions   Exercise Discussed/Reviewed Exercise Reviewed, Physical Activity, Weight Managment  Physical Activity Discussed/Reviewed Physical Activity Reviewed  Pearletha Furl mobility reports she is remaining active at home]  Weight Management Weight maintenance  Education Interventions   Education Provided Provided Education  Provided Verbal Education On [employer], Labs  Labs Reviewed --  [Hemoglobin, Injection confirmed to be pending "next week"]  Mental Health  Interventions   Mental Health Discussed/Reviewed Mental Health Reviewed, Coping Strategies  Nutrition Interventions   Nutrition Discussed/Reviewed Nutrition Reviewed  [denies concerns]  Pharmacy Interventions   Pharmacy Dicussed/Reviewed Pharmacy Topics Reviewed, Affording Medications              SDOH assessments and interventions completed:  No     Care Coordination Interventions:  Yes, provided   Follow up plan:  warm transfer to new RN CM Pam T    Encounter Outcome:  Patient Visit Completed   Cala Bradford L. Noelle Penner, RN, BSN, [facility]  VBCI Care Management Coordinator  [phone]  Fax: [phone]

## 2023-07-14 ENCOUNTER — Telehealth: Payer: Self-pay | Admitting: Family Medicine

## 2023-07-14 ENCOUNTER — Other Ambulatory Visit: Payer: Self-pay

## 2023-07-14 DIAGNOSIS — M8580 Other specified disorders of bone density and structure, unspecified site: Secondary | ICD-10-CM

## 2023-07-14 DIAGNOSIS — C50511 Malignant neoplasm of lower-outer quadrant of right female breast: Secondary | ICD-10-CM

## 2023-07-14 DIAGNOSIS — J449 Chronic obstructive pulmonary disease, unspecified: Secondary | ICD-10-CM | POA: Diagnosis not present

## 2023-07-14 NOTE — Telephone Encounter (Unsigned)
Copied from CRM [id number]. Topic: Clinical - Home Health Verbal Orders >> Jul 14, 2023  1:38 PM Conni Elliot wrote: Caller/Agency: Burna Mortimer from [facility] Callback Number: [phone] Service Requested: Home health certification and plan of care from 10/5-12/13 Frequency: Caller is following up on order and states the order is over a month old now Any new concerns about the patient? No

## 2023-07-14 NOTE — Progress Notes (Signed)
Lab orders entered

## 2023-07-15 ENCOUNTER — Inpatient Hospital Stay: Payer: PPO

## 2023-07-15 VITALS — BP 162/55 | HR 57 | Temp 97.5°F | Resp 19

## 2023-07-15 DIAGNOSIS — M8580 Other specified disorders of bone density and structure, unspecified site: Secondary | ICD-10-CM

## 2023-07-15 DIAGNOSIS — D649 Anemia, unspecified: Secondary | ICD-10-CM

## 2023-07-15 DIAGNOSIS — Z170 Estrogen receptor positive status [ER+]: Secondary | ICD-10-CM

## 2023-07-15 DIAGNOSIS — N183 Chronic kidney disease, stage 3 unspecified: Secondary | ICD-10-CM | POA: Diagnosis not present

## 2023-07-15 LAB — COMPREHENSIVE METABOLIC PANEL
ALT: 12 U/L (ref 0–44)
AST: 20 U/L (ref 15–41)
Albumin: 3.8 g/dL (ref 3.5–5.0)
Alkaline Phosphatase: 37 U/L — ABNORMAL LOW (ref 38–126)
Anion gap: 8 (ref 5–15)
BUN: 33 mg/dL — ABNORMAL HIGH (ref 8–23)
CO2: 35 mmol/L — ABNORMAL HIGH (ref 22–32)
Calcium: 8.4 mg/dL — ABNORMAL LOW (ref 8.9–10.3)
Chloride: 94 mmol/L — ABNORMAL LOW (ref 98–111)
Creatinine, Ser: 1.3 mg/dL — ABNORMAL HIGH (ref 0.44–1.00)
GFR, Estimated: 40 mL/min — ABNORMAL LOW (ref >60)
Glucose, Bld: 104 mg/dL — ABNORMAL HIGH (ref 70–99)
Potassium: 4 mmol/L (ref 3.5–5.1)
Sodium: 137 mmol/L (ref 135–145)
Total Bilirubin: 0.8 mg/dL (ref <1.2)
Total Protein: 6.6 g/dL (ref 6.5–8.1)

## 2023-07-15 LAB — CBC
HCT: 28.9 % — ABNORMAL LOW (ref 36.0–46.0)
Hemoglobin: 9 g/dL — ABNORMAL LOW (ref 12.0–15.0)
MCH: 27.9 pg (ref 26.0–34.0)
MCHC: 31.1 g/dL (ref 30.0–36.0)
MCV: 89.5 fL (ref 80.0–100.0)
Platelets: 115 10*3/uL — ABNORMAL LOW (ref 150–400)
RBC: 3.23 MIL/uL — ABNORMAL LOW (ref 3.87–5.11)
RDW: 15.6 % — ABNORMAL HIGH (ref 11.5–15.5)
WBC: 1.9 10*3/uL — ABNORMAL LOW (ref 4.0–10.5)
nRBC: 0 % (ref 0.0–0.2)

## 2023-07-15 LAB — IRON AND TIBC
Iron: 32 ug/dL (ref 28–170)
Saturation Ratios: 11 % (ref 10.4–31.8)
TIBC: 293 ug/dL (ref 250–450)
UIBC: 261 ug/dL

## 2023-07-15 LAB — SAMPLE TO BLOOD BANK

## 2023-07-15 LAB — LACTATE DEHYDROGENASE: LDH: 126 U/L (ref 98–192)

## 2023-07-15 LAB — FERRITIN: Ferritin: 254 ng/mL (ref 11–307)

## 2023-07-15 MED ORDER — EPOETIN ALFA-EPBX 20000 UNIT/ML IJ SOLN
20000.0000 [IU] | Freq: Once | INTRAMUSCULAR | Status: AC
Start: 2023-07-15 — End: 2023-07-15
  Administered 2023-07-15: 20000 [IU] via SUBCUTANEOUS
  Filled 2023-07-15: qty 1

## 2023-07-15 NOTE — Progress Notes (Signed)
Hemoglobin today is 9.0.  We will proceed with Retacrit injection per provider orders.   Patient tolerated injection with no complaints voiced.  Site clean and dry with no bruising or swelling noted.  No complaints of pain.  Discharged with vital signs stable and no signs or symptoms of distress noted.

## 2023-07-15 NOTE — Patient Instructions (Signed)
CANCER CENTER - A DEPT OF [facility]  Discharge Instructions: Thank you for choosing  Cancer Center to provide your oncology and hematology care.  If you have a lab appointment with the Cancer Center - please note that after April 8th, 2024, all labs will be drawn in the cancer center.  You do not have to check in or register with the main entrance as you have in the past but will complete your check-in in the cancer center.  Wear comfortable clothing and clothing appropriate for easy access to any Portacath or PICC line.   We strive to give you quality time with your provider. You may need to reschedule your appointment if you arrive late (15 or more minutes).  Arriving late affects you and other patients whose appointments are after yours.  Also, if you miss three or more appointments without notifying the office, you may be dismissed from the clinic at the provider's discretion.      For prescription refill requests, have your pharmacy contact our office and allow 72 hours for refills to be completed.    Today you received the following:  Retacrit.  Epoetin Alfa Injection What is this medication? EPOETIN ALFA (e POE e tin AL fa) treats low levels of red blood cells (anemia) caused by kidney disease, chemotherapy, or HIV medications. It can also be used in people who are at risk for blood loss during surgery. It works by Systems analyst make more red blood cells, which reduces the need for blood transfusions. This medicine may be used for other purposes; ask your health care provider or pharmacist if you have questions. COMMON BRAND NAME(S): Epogen, Procrit, Retacrit What should I tell my care team before I take this medication? They need to know if you have any of these conditions: Blood clots Cancer Heart disease High blood pressure On dialysis Seizures Stroke An unusual or allergic reaction to epoetin alfa, albumin, benzyl alcohol, other  medications, foods, dyes, or preservatives Pregnant or trying to get pregnant Breast-feeding How should I use this medication? This medication is injected into a vein or under the skin. It is usually given by your care team in a hospital or clinic setting. It may also be given at home. If you get this medication at home, you will be taught how to prepare and give it. Use exactly as directed. Take it as directed on the prescription label at the same time every day. Keep taking it unless your care team tells you to stop. It is important that you put your used needles and syringes in a special sharps container. Do not put them in a trash can. If you do not have a sharps container, call your pharmacist or care team to get one. A special MedGuide will be given to you by the pharmacist with each prescription and refill. Be sure to read this information carefully each time. Talk to your care team about the use of this medication in children. While this medication may be used in children as young as 1 month of age for selected conditions, precautions do apply. Overdosage: If you think you have taken too much of this medicine contact a poison control center or emergency room at once. NOTE: This medicine is only for you. Do not share this medicine with others. What if I miss a dose? If you miss a dose, take it as soon as you can. If it is almost time for your next dose,  take only that dose. Do not take double or extra doses. What may interact with this medication? Darbepoetin alfa Methoxy polyethylene glycol-epoetin beta This list may not describe all possible interactions. Give your health care provider a list of all the medicines, herbs, non-prescription drugs, or dietary supplements you use. Also tell them if you smoke, drink alcohol, or use illegal drugs. Some items may interact with your medicine. What should I watch for while using this medication? Visit your care team for regular checks on your  progress. Check your blood pressure as directed. Know what your blood pressure should be and when to contact your care team. Your condition will be monitored carefully while you are receiving this medication. You may need blood work while taking this medication. What side effects may I notice from receiving this medication? Side effects that you should report to your care team as soon as possible: Allergic reactions--skin rash, itching, hives, swelling of the face, lips, tongue, or throat Blood clot--pain, swelling, or warmth in the leg, shortness of breath, chest pain Heart attack--pain or tightness in the chest, shoulders, arms, or jaw, nausea, shortness of breath, cold or clammy skin, feeling faint or lightheaded Increase in blood pressure Rash, fever, and swollen lymph nodes Redness, blistering, peeling, or loosening of the skin, including inside the mouth Seizures Stroke--sudden numbness or weakness of the face, arm, or leg, trouble speaking, confusion, trouble walking, loss of balance or coordination, dizziness, severe headache, change in vision Side effects that usually do not require medical attention (report to your care team if they continue or are bothersome): Bone, joint, or muscle pain Cough Headache Nausea Pain, redness, or irritation at injection site This list may not describe all possible side effects. Call your doctor for medical advice about side effects. You may report side effects to FDA at 1-800-FDA-1088. Where should I keep my medication? Keep out of the reach of children and pets. Store in a refrigerator. Do not freeze. Do not shake. Protect from light. Keep this medication in the original container until you are ready to take it. See product for storage information. Get rid of any unused medication after the expiration date. To get rid of medications that are no longer needed or have expired: Take the medication to a medication take-back program. Check with your  pharmacy or law enforcement to find a location. If you cannot return the medication, ask your pharmacist or care team how to get rid of the medication safely. NOTE: This sheet is a summary. It may not cover all possible information. If you have questions about this medicine, talk to your doctor, pharmacist, or health care provider.  2024 Elsevier/Gold Standard (2021-12-06 00:00:00)     To help prevent nausea and vomiting after your treatment, we encourage you to take your nausea medication as directed.  BELOW ARE SYMPTOMS THAT SHOULD BE REPORTED IMMEDIATELY: *FEVER GREATER THAN 100.4 F (38 C) OR HIGHER *CHILLS OR SWEATING *NAUSEA AND VOMITING THAT IS NOT CONTROLLED WITH YOUR NAUSEA MEDICATION *UNUSUAL SHORTNESS OF BREATH *UNUSUAL BRUISING OR BLEEDING *URINARY PROBLEMS (pain or burning when urinating, or frequent urination) *BOWEL PROBLEMS (unusual diarrhea, constipation, pain near the anus) TENDERNESS IN MOUTH AND THROAT WITH OR WITHOUT PRESENCE OF ULCERS (sore throat, sores in mouth, or a toothache) UNUSUAL RASH, SWELLING OR PAIN  UNUSUAL VAGINAL DISCHARGE OR ITCHING   Items with * indicate a potential emergency and should be followed up as soon as possible or go to the Emergency Department if any problems should occur.  Please show the CHEMOTHERAPY ALERT CARD or IMMUNOTHERAPY ALERT CARD at check-in to the Emergency Department and triage nurse.  Should you have questions after your visit or need to cancel or reschedule your appointment, please contact [place] [facility] - A DEPT OF [facility] [phone]  and follow the prompts.  Office hours are 8:00 a.m. to 4:30 p.m. Monday - Friday. Please note that voicemails left after 4:00 p.m. may not be returned until the following business day.  We are closed weekends and major holidays. You have access to a nurse at all times for urgent questions. Please call the main number to the clinic [phone] and follow the  prompts.  For any non-urgent questions, you may also contact your provider using MyChart. We now offer e-Visits for anyone 58 and older to request care online for non-urgent symptoms. For details visit [email or web address].   Also download the MyChart app! Go to the app store, search "MyChart", open the app, select , and log in with your MyChart username and password.

## 2023-07-28 ENCOUNTER — Ambulatory Visit (HOSPITAL_BASED_OUTPATIENT_CLINIC_OR_DEPARTMENT_OTHER): Payer: PPO

## 2023-07-28 ENCOUNTER — Encounter (HOSPITAL_BASED_OUTPATIENT_CLINIC_OR_DEPARTMENT_OTHER): Payer: Self-pay | Admitting: Pulmonary Disease

## 2023-07-28 ENCOUNTER — Encounter (HOSPITAL_BASED_OUTPATIENT_CLINIC_OR_DEPARTMENT_OTHER): Payer: Self-pay

## 2023-07-28 ENCOUNTER — Encounter (HOSPITAL_BASED_OUTPATIENT_CLINIC_OR_DEPARTMENT_OTHER): Payer: PPO

## 2023-07-28 ENCOUNTER — Ambulatory Visit (HOSPITAL_BASED_OUTPATIENT_CLINIC_OR_DEPARTMENT_OTHER): Payer: PPO | Admitting: Pulmonary Disease

## 2023-07-28 VITALS — BP 180/60 | HR 55 | Resp 16 | Ht 64.0 in | Wt 108.0 lb

## 2023-07-28 DIAGNOSIS — J4489 Other specified chronic obstructive pulmonary disease: Secondary | ICD-10-CM

## 2023-07-28 DIAGNOSIS — I517 Cardiomegaly: Secondary | ICD-10-CM | POA: Diagnosis not present

## 2023-07-28 DIAGNOSIS — J9611 Chronic respiratory failure with hypoxia: Secondary | ICD-10-CM

## 2023-07-28 DIAGNOSIS — J90 Pleural effusion, not elsewhere classified: Secondary | ICD-10-CM | POA: Diagnosis not present

## 2023-07-28 DIAGNOSIS — J189 Pneumonia, unspecified organism: Secondary | ICD-10-CM

## 2023-07-28 DIAGNOSIS — J811 Chronic pulmonary edema: Secondary | ICD-10-CM | POA: Diagnosis not present

## 2023-07-28 DIAGNOSIS — J849 Interstitial pulmonary disease, unspecified: Secondary | ICD-10-CM

## 2023-07-28 MED ORDER — PREDNISONE 10 MG PO TABS: ORAL_TABLET | ORAL | 0 refills | Status: DC

## 2023-07-28 MED ORDER — TRELEGY ELLIPTA 100-62.5-25 MCG/ACT IN AEPB: 1.0000 | INHALATION_SPRAY | Freq: Every day | RESPIRATORY_TRACT | Status: DC

## 2023-07-28 NOTE — Progress Notes (Signed)
Subjective:    Patient ID: [name], female    DOB: 1934/07/22, 89 y.o.   MRN: [id number]  HPI  89 year old never smoker  for FU of chronic respiratory failure due to CHF /pneumonia after recent hospitalization She has been seen by [facility] in 08/2021 for asthmatic bronchitis after she had hospitalizations for "pneumonia' >> continued on Symbicort -it seems she was never diagnosed with asthma prior to this     PMH :CKD 3a, HTN,  Chr Diastolic CHF, A fib, mod AS Hypothyroidism, Rt renal artery stenosis, Vertigo, Breast cancer, Esophageal candidiasis  Leukopenia-advised bone marrow biopsy, refused , presumed myelodysplastic syndrome on Procrit every 2 weeks   Initial OV 04/2023  She was hospitalized 8/15 to 8/17 for shortness of breath and noted to be hypoxic to 70%, she was discharged on 2 L oxygen  still needing oxygen even though she appears well diuresed.  We may have to think about a second etiology for hypoxia other than CHF. She has bibasal crackles, review of previous imaging shows increased interstitial infiltrates in 05/2022 which resolved suggesting that this was also pulmonary edema. >> sent an order to [facility] for a lightweight POC   Chief Complaint  Patient presents with   Follow-up    PFT FU. BP 180/60 but she says it does that at Drs and not at home.    47-month follow-up visit She was hospitalized 05/2023 for bilateral pneumonia, had leukocytosis and increased procalcitonin, treated with antibiotics and improved. She had RSV pneumonia 08/2022 She received lightweight POC from [employer], she is still hoping she can get off oxygen  We reviewed CT scan and PFTs today.  CT scan was done soon after her discharge for pneumonia and shows patchy bilateral infiltrates   Significant tests/ events reviewed PFTs 06/2023 moderate airway obstruction ratio 65, FEV1 42%, FVC 47%, no bronchodilator response, TLC normal, DLCO 40%  HRCT chest 05/2023 >>  Multifocal nodular consolidation in  the lungs with septal thickening and ground-glass. Findings may be due to pneumonia. Given nodularity and thickening along the fissures, as well as minimally hyperdense mediastinal adenopathy, consider alveolar sarcoid  04/22/23 Oxygen saturation at rest on room air was 88% and dropped to 83% on ambulating 1 lap,  POC 2 L to 83%   03/2023 echo: LVEF 60-65%, grade II dd, mod MS mean grad 6, mod to severe AS mean grad 34, AVA VTI 0.93 DI 0.41    05/2022 VQ scan negative. CT abdomen 01/2021 shows clear lungs  Review of Systems neg for any significant sore throat, dysphagia, itching, sneezing, nasal congestion or excess/ purulent secretions, fever, chills, sweats, unintended wt loss, pleuritic or exertional cp, hempoptysis, orthopnea pnd or change in chronic leg swelling. Also denies presyncope, palpitations, heartburn, abdominal pain, nausea, vomiting, diarrhea or change in bowel or urinary habits, dysuria,hematuria, rash, arthralgias, visual complaints, headache, numbness weakness or ataxia.     Objective:   Physical Exam  Gen. Pleasant, elderly, thin, well-nourished, in no distress ENT - no thrush, no pallor/icterus,no post nasal drip, on POC Neck: No JVD, no thyromegaly, no carotid bruits Lungs: no use of accessory muscles, no dullness to percussion, clear without rales or rhonchi  Cardiovascular: Rhythm regular, heart sounds  normal, no murmurs or gallops, no peripheral edema Musculoskeletal: No deformities, no cyanosis or clubbing        Assessment & Plan:    Unfortunately CT scan was done soon after admission for bilateral pneumonia and shows residual patchy infiltrates.  Although possibility  of sarcoidosis was raised I feel this is more likely resolving pneumonia. Chest x-ray obtained today shows much improved bilateral infiltrates which would also be consistent with resolving pneumonia.  It is reassuring that she does not have pulmonary fibrosis However she remains on oxygen,  although she appears well diuresed.  She does have obstructive lung disease on PFTs and we will treat her as COPD although she is a non-smoker. Reason for her recurrent pneumonias is unclear.  She has refused bone marrow biopsy but she very well may have myelodysplastic syndrome   Assessment:    1 or more chronic illnesses with severe exacerbation, progression, or side effects of treatment;   1 acute or chronic illness or injury that poses a threat to life or bodily function  Plan Following Extensive Data Review & Interpretation:   I reviewed prior external note(s) from discharge summary  I reviewed the result(s) of labs, imaging and PFTs  I have ordered chest x-ray  Independent interpretation of tests  Review of patient's chest x-ray images revealed resolving pneumonia. The patient's images have been independently reviewed by me.    Discussion of management or test interpretation with another colleague PCP.

## 2023-07-28 NOTE — Patient Instructions (Signed)
X CXR today follow up pneumonia  X Prednisone 10 mg tabs Take 4 tabs  daily with food x 4 days, then 3 tabs daily x 4 days, then 2 tabs daily x 4 days, then 1 tab daily x4 days then stop. #40  X Trial of trelegy 100 - 1 puff daily

## 2023-07-28 NOTE — Assessment & Plan Note (Signed)
Continue oxygen via POC, maintain saturation above 90%

## 2023-07-28 NOTE — Assessment & Plan Note (Signed)
Since she has not had obstruction PFTs we will treat her as chronic obstructive asthma, initially with a course of steroids for 2 weeks  Prednisone 10 mg tabs Take 4 tabs  daily with food x 4 days, then 3 tabs daily x 4 days, then 2 tabs daily x 4 days, then 1 tab daily x4 days then stop. #40  She will then trial Trelegy and call us for prescription of this works. Will reassess in 1 month

## 2023-07-29 ENCOUNTER — Other Ambulatory Visit: Payer: Self-pay

## 2023-07-29 DIAGNOSIS — C50511 Malignant neoplasm of lower-outer quadrant of right female breast: Secondary | ICD-10-CM

## 2023-07-29 DIAGNOSIS — M8580 Other specified disorders of bone density and structure, unspecified site: Secondary | ICD-10-CM

## 2023-07-30 ENCOUNTER — Inpatient Hospital Stay: Payer: PPO | Attending: Hematology

## 2023-07-30 ENCOUNTER — Inpatient Hospital Stay: Payer: PPO

## 2023-07-30 VITALS — BP 144/43 | HR 57 | Temp 97.6°F | Resp 18

## 2023-07-30 DIAGNOSIS — N183 Chronic kidney disease, stage 3 unspecified: Secondary | ICD-10-CM | POA: Insufficient documentation

## 2023-07-30 DIAGNOSIS — D631 Anemia in chronic kidney disease: Secondary | ICD-10-CM | POA: Insufficient documentation

## 2023-07-30 DIAGNOSIS — M8580 Other specified disorders of bone density and structure, unspecified site: Secondary | ICD-10-CM

## 2023-07-30 DIAGNOSIS — D649 Anemia, unspecified: Secondary | ICD-10-CM

## 2023-07-30 DIAGNOSIS — C50511 Malignant neoplasm of lower-outer quadrant of right female breast: Secondary | ICD-10-CM

## 2023-07-30 LAB — CBC WITH DIFFERENTIAL/PLATELET
Abs Immature Granulocytes: 0.01 10*3/uL (ref 0.00–0.07)
Basophils Absolute: 0 10*3/uL (ref 0.0–0.1)
Basophils Relative: 0 %
Eosinophils Absolute: 0 10*3/uL (ref 0.0–0.5)
Eosinophils Relative: 0 %
HCT: 28.8 % — ABNORMAL LOW (ref 36.0–46.0)
Hemoglobin: 9 g/dL — ABNORMAL LOW (ref 12.0–15.0)
Immature Granulocytes: 0 %
Lymphocytes Relative: 7 %
Lymphs Abs: 0.2 10*3/uL — ABNORMAL LOW (ref 0.7–4.0)
MCH: 28 pg (ref 26.0–34.0)
MCHC: 31.3 g/dL (ref 30.0–36.0)
MCV: 89.7 fL (ref 80.0–100.0)
Monocytes Absolute: 0.1 10*3/uL (ref 0.1–1.0)
Monocytes Relative: 2 %
Neutro Abs: 2.8 10*3/uL (ref 1.7–7.7)
Neutrophils Relative %: 91 %
Platelets: 101 10*3/uL — ABNORMAL LOW (ref 150–400)
RBC: 3.21 MIL/uL — ABNORMAL LOW (ref 3.87–5.11)
RDW: 15.6 % — ABNORMAL HIGH (ref 11.5–15.5)
WBC: 3.1 10*3/uL — ABNORMAL LOW (ref 4.0–10.5)
nRBC: 0 % (ref 0.0–0.2)

## 2023-07-30 LAB — COMPREHENSIVE METABOLIC PANEL
ALT: 14 U/L (ref 0–44)
AST: 29 U/L (ref 15–41)
Albumin: 4.1 g/dL (ref 3.5–5.0)
Alkaline Phosphatase: 42 U/L (ref 38–126)
Anion gap: 10 (ref 5–15)
BUN: 45 mg/dL — ABNORMAL HIGH (ref 8–23)
CO2: 33 mmol/L — ABNORMAL HIGH (ref 22–32)
Calcium: 8.8 mg/dL — ABNORMAL LOW (ref 8.9–10.3)
Chloride: 91 mmol/L — ABNORMAL LOW (ref 98–111)
Creatinine, Ser: 1.72 mg/dL — ABNORMAL HIGH (ref 0.44–1.00)
GFR, Estimated: 28 mL/min — ABNORMAL LOW (ref >60)
Glucose, Bld: 182 mg/dL — ABNORMAL HIGH (ref 70–99)
Potassium: 4 mmol/L (ref 3.5–5.1)
Sodium: 134 mmol/L — ABNORMAL LOW (ref 135–145)
Total Bilirubin: 0.7 mg/dL (ref <1.2)
Total Protein: 7.4 g/dL (ref 6.5–8.1)

## 2023-07-30 LAB — SAMPLE TO BLOOD BANK

## 2023-07-30 MED ORDER — EPOETIN ALFA-EPBX 20000 UNIT/ML IJ SOLN
20000.0000 [IU] | Freq: Once | INTRAMUSCULAR | Status: AC
  Administered 2023-07-30: 20000 [IU] via SUBCUTANEOUS
  Filled 2023-07-30: qty 1

## 2023-07-30 NOTE — Progress Notes (Signed)
Patient presents today for Retacrit injection per providers order.  Vital signs WNL.  Hbg noted to be 9.0.  Patient has no new complaints at this time. Stable during administration without incident; injection site WNL; see MAR for injection details.  Patient tolerated procedure well and without incident.  No questions or complaints noted at this time.

## 2023-07-30 NOTE — Patient Instructions (Signed)
 [facility] [place] [facility]  Discharge Instructions: Thank you for choosing [facility] to provide your oncology and hematology care.  If you have a lab appointment with the Cancer Center - please note that after April 8th, 2024, all labs will be drawn in the cancer center.  You do not have to check in or register with the main entrance as you have in the past but will complete your check-in in the cancer center.  Wear comfortable clothing and clothing appropriate for easy access to any Portacath or PICC line.   We strive to give you quality time with your provider. You may need to reschedule your appointment if you arrive late (15 or more minutes).  Arriving late affects you and other patients whose appointments are after yours.  Also, if you miss three or more appointments without notifying the office, you may be dismissed from the clinic at the provider's discretion.      For prescription refill requests, have your pharmacy contact our office and allow 72 hours for refills to be completed.    Today you received the following chemotherapy and/or immunotherapy agents Retacrit      To help prevent nausea and vomiting after your treatment, we encourage you to take your nausea medication as directed.  BELOW ARE SYMPTOMS THAT SHOULD BE REPORTED IMMEDIATELY: *FEVER GREATER THAN 100.4 F (38 C) OR HIGHER *CHILLS OR SWEATING *NAUSEA AND VOMITING THAT IS NOT CONTROLLED WITH YOUR NAUSEA MEDICATION *UNUSUAL SHORTNESS OF BREATH *UNUSUAL BRUISING OR BLEEDING *URINARY PROBLEMS (pain or burning when urinating, or frequent urination) *BOWEL PROBLEMS (unusual diarrhea, constipation, pain near the anus) TENDERNESS IN MOUTH AND THROAT WITH OR WITHOUT PRESENCE OF ULCERS (sore throat, sores in mouth, or a toothache) UNUSUAL RASH, SWELLING OR PAIN  UNUSUAL VAGINAL DISCHARGE OR ITCHING   Items with * indicate a potential emergency and should be followed up  as soon as possible or go to the Emergency Department if any problems should occur.  Please show the CHEMOTHERAPY ALERT CARD or IMMUNOTHERAPY ALERT CARD at check-in to the Emergency Department and triage nurse.  Should you have questions after your visit or need to cancel or reschedule your appointment, please contact [facility] [place] - A DEPT OF [facility] [phone]  and follow the prompts.  Office hours are 8:00 a.m. to 4:30 p.m. Monday - Friday. Please note that voicemails left after 4:00 p.m. may not be returned until the following business day.  We are closed weekends and major holidays. You have access to a nurse at all times for urgent questions. Please call the main number to the clinic [phone] and follow the prompts.  For any non-urgent questions, you may also contact your provider using MyChart. We now offer e-Visits for anyone 31 and older to request care online for non-urgent symptoms. For details visit [email or web address].   Also download the MyChart app! Go to the app store, search "MyChart", open the app, select [place], and log in with your MyChart username and password.

## 2023-07-31 ENCOUNTER — Inpatient Hospital Stay: Payer: PPO | Admitting: Oncology

## 2023-07-31 ENCOUNTER — Inpatient Hospital Stay: Payer: PPO

## 2023-08-04 ENCOUNTER — Other Ambulatory Visit: Payer: Self-pay | Admitting: Family Medicine

## 2023-08-04 DIAGNOSIS — I781 Nevus, non-neoplastic: Secondary | ICD-10-CM | POA: Diagnosis not present

## 2023-08-04 DIAGNOSIS — D485 Neoplasm of uncertain behavior of skin: Secondary | ICD-10-CM | POA: Diagnosis not present

## 2023-08-04 DIAGNOSIS — L98498 Non-pressure chronic ulcer of skin of other sites with other specified severity: Secondary | ICD-10-CM | POA: Diagnosis not present

## 2023-08-04 DIAGNOSIS — L905 Scar conditions and fibrosis of skin: Secondary | ICD-10-CM | POA: Diagnosis not present

## 2023-08-05 DIAGNOSIS — J189 Pneumonia, unspecified organism: Secondary | ICD-10-CM | POA: Diagnosis not present

## 2023-08-05 DIAGNOSIS — Z515 Encounter for palliative care: Secondary | ICD-10-CM | POA: Diagnosis not present

## 2023-08-05 DIAGNOSIS — N184 Chronic kidney disease, stage 4 (severe): Secondary | ICD-10-CM | POA: Diagnosis not present

## 2023-08-05 DIAGNOSIS — Z66 Do not resuscitate: Secondary | ICD-10-CM | POA: Diagnosis not present

## 2023-08-05 NOTE — Telephone Encounter (Signed)
FYI for Doran   Copied from [employer] [id number]. Topic: Clinical - Home Health Verbal Orders >> Aug 05, 2023  1:56 PM Geroge Baseman wrote: Caller/Agency: Burna Mortimer from [facility] Callback Number: [phone] Service Requested: Burna Mortimer is looking for response on paperwork that was faxed over to the office. Would like a call back. Frequency: Caller is follow up this is her third time states no response from admin Any new concerns about the patient? No

## 2023-08-07 ENCOUNTER — Ambulatory Visit: Payer: Self-pay | Admitting: *Deleted

## 2023-08-07 NOTE — Patient Outreach (Addendum)
  Care Coordination   Follow Up Visit Note   08/07/2023 Name: [name] MRN: [id number] DOB: Jun 14, 1934  [name] is a 89 y.o. year old female who sees [name], DO for primary care. I spoke with  [name] by phone today.  What matters to the patients health and wellness today?  Home health services/mobility, anemia with injections/hemoglobin value, congestive Heart Failure (CHF)  Moving better in her home health completed  no swelling  , maintaining weight within 3-5 lbs  Receiving her infusions for anemia, reports more energy, able to informed RN that her hemoglobin has increased from   Denies any other new or worsening medical or social concerns  Voiced understanding that RN CM will return to being her CCM for now    Goals Addressed             This Visit's Progress    decrease re admission risks -RN CM coordination services       Decrease re admission risks- 07/13/23 reports she continues to do well at home only with some shortness of breath with exertion, still uses her oxygen, and plans to get anemia injection "next week" Patient pending home health services 07/13/23 patient reports services are pending rescheduling  Interventions Today    Flowsheet Row Most Recent Value  Chronic Disease   Chronic disease during today's visit Congestive Heart Failure (CHF), Other  [home health, anemia, mobility-]  General Interventions   General Interventions Discussed/Reviewed General Interventions Reviewed, Labs, Doctor Visits  Labs --  [hemaglobin increased from 8 to 9 confirm more energy]  Doctor Visits Discussed/Reviewed Doctor Visits Reviewed, PCP, Specialist  PCP/Specialist Visits Compliance with follow-up visit  Exercise Interventions   Exercise Discussed/Reviewed Exercise Reviewed, Physical Activity, Weight Managment, Assistive device use and maintanence  Physical Activity Discussed/Reviewed Physical Activity Reviewed, Types of exercise  [confirmed  home health completed and doing home activities now]  Weight Management Weight maintenance  Mental Health Interventions   Mental Health Discussed/Reviewed Mental Health Reviewed, Coping Strategies  Pharmacy Interventions   Pharmacy Dicussed/Reviewed Pharmacy Topics Reviewed, Affording Medications  Safety Interventions   Safety Discussed/Reviewed Safety Reviewed, Home Safety  Home Safety Assistive Devices              SDOH assessments and interventions completed:  No     Care Coordination Interventions:  Yes, provided   Follow up plan: Follow up call scheduled for 10/08/23 1000    Encounter Outcome:  Patient Visit Completed   Cala Bradford L. Noelle Penner, RN, BSN, [facility]  [facility] Care Management Coordinator  [phone]  Fax: [phone]

## 2023-08-07 NOTE — Patient Instructions (Signed)
Visit Information  Thank you for taking time to visit with me today. Please don't hesitate to contact me if I can be of assistance to you.   Following are the goals we discussed today:   Goals Addressed             This Visit's Progress    decrease re admission risks -RN CM coordination services       Decrease re admission risks- 07/13/23 reports she continues to do well at home only with some shortness of breath with exertion, still uses her oxygen, and plans to get anemia injection "next week" Patient pending home health services 07/13/23 patient reports services are pending rescheduling  Interventions Today    Flowsheet Row Most Recent Value  Chronic Disease   Chronic disease during today's visit Congestive Heart Failure (CHF), Other  [home health, anemia, mobility-]  General Interventions   General Interventions Discussed/Reviewed General Interventions Reviewed, Labs, Doctor Visits  Labs --  [hemaglobin increased from 8 to 9 confirm more energy]  Doctor Visits Discussed/Reviewed Doctor Visits Reviewed, PCP, Specialist  PCP/Specialist Visits Compliance with follow-up visit  Exercise Interventions   Exercise Discussed/Reviewed Exercise Reviewed, Physical Activity, Weight Managment, Assistive device use and maintanence  Physical Activity Discussed/Reviewed Physical Activity Reviewed, Types of exercise  [confirmed home health completed and doing home activities now]  Weight Management Weight maintenance  Mental Health Interventions   Mental Health Discussed/Reviewed Mental Health Reviewed, Coping Strategies  Pharmacy Interventions   Pharmacy Dicussed/Reviewed Pharmacy Topics Reviewed, Affording Medications  Safety Interventions   Safety Discussed/Reviewed Safety Reviewed, Home Safety  Home Safety Assistive Devices              Our next appointment is by telephone on 10/08/23 at 1000  Please call the care guide team at [phone] if you need to cancel or reschedule your  appointment.   If you are experiencing a Mental Health or Behavioral Health Crisis or need someone to talk to, please call the Suicide and Crisis Lifeline: 988 call the [place] National Suicide Prevention Lifeline: 702-788-8046 or TTY: 7476254186 TTY (226)504-1511) to talk to a trained counselor call 1-800-273-TALK (toll free, 24 hour hotline) call the [facility]: [phone] call 911   Patient verbalizes understanding of instructions and care plan provided today and agrees to view in MyChart. Active MyChart status and patient understanding of how to access instructions and care plan via MyChart confirmed with patient.     The patient has been provided with contact information for the care management team and has been advised to call with any health related questions or concerns.   Gayl Ivanoff L. Noelle Penner, RN, BSN, [facility]  [facility] Care Management Coordinator  [phone]  Fax: [phone]

## 2023-08-13 DIAGNOSIS — J449 Chronic obstructive pulmonary disease, unspecified: Secondary | ICD-10-CM | POA: Diagnosis not present

## 2023-08-17 ENCOUNTER — Other Ambulatory Visit: Payer: Self-pay | Admitting: Family Medicine

## 2023-08-17 DIAGNOSIS — I10 Essential (primary) hypertension: Secondary | ICD-10-CM

## 2023-08-18 ENCOUNTER — Other Ambulatory Visit: Payer: Self-pay

## 2023-08-18 DIAGNOSIS — D696 Thrombocytopenia, unspecified: Secondary | ICD-10-CM

## 2023-08-18 DIAGNOSIS — D61818 Other pancytopenia: Secondary | ICD-10-CM

## 2023-08-18 DIAGNOSIS — M8580 Other specified disorders of bone density and structure, unspecified site: Secondary | ICD-10-CM

## 2023-08-18 DIAGNOSIS — C50511 Malignant neoplasm of lower-outer quadrant of right female breast: Secondary | ICD-10-CM

## 2023-08-18 DIAGNOSIS — D649 Anemia, unspecified: Secondary | ICD-10-CM

## 2023-08-20 ENCOUNTER — Inpatient Hospital Stay: Payer: PPO | Admitting: Oncology

## 2023-08-20 ENCOUNTER — Inpatient Hospital Stay: Payer: PPO | Attending: Hematology

## 2023-08-20 ENCOUNTER — Inpatient Hospital Stay: Payer: PPO

## 2023-08-20 VITALS — BP 144/45 | HR 50 | Temp 98.0°F | Resp 16

## 2023-08-20 DIAGNOSIS — M8580 Other specified disorders of bone density and structure, unspecified site: Secondary | ICD-10-CM

## 2023-08-20 DIAGNOSIS — D61818 Other pancytopenia: Secondary | ICD-10-CM

## 2023-08-20 DIAGNOSIS — D631 Anemia in chronic kidney disease: Secondary | ICD-10-CM | POA: Diagnosis not present

## 2023-08-20 DIAGNOSIS — D500 Iron deficiency anemia secondary to blood loss (chronic): Secondary | ICD-10-CM

## 2023-08-20 DIAGNOSIS — N1832 Chronic kidney disease, stage 3b: Secondary | ICD-10-CM | POA: Insufficient documentation

## 2023-08-20 DIAGNOSIS — C50511 Malignant neoplasm of lower-outer quadrant of right female breast: Secondary | ICD-10-CM

## 2023-08-20 DIAGNOSIS — D696 Thrombocytopenia, unspecified: Secondary | ICD-10-CM

## 2023-08-20 DIAGNOSIS — D649 Anemia, unspecified: Secondary | ICD-10-CM

## 2023-08-20 DIAGNOSIS — Z170 Estrogen receptor positive status [ER+]: Secondary | ICD-10-CM

## 2023-08-20 LAB — COMPREHENSIVE METABOLIC PANEL
ALT: 13 U/L (ref 0–44)
AST: 21 U/L (ref 15–41)
Albumin: 3.8 g/dL (ref 3.5–5.0)
Alkaline Phosphatase: 35 U/L — ABNORMAL LOW (ref 38–126)
Anion gap: 9 (ref 5–15)
BUN: 47 mg/dL — ABNORMAL HIGH (ref 8–23)
CO2: 34 mmol/L — ABNORMAL HIGH (ref 22–32)
Calcium: 8.9 mg/dL (ref 8.9–10.3)
Chloride: 94 mmol/L — ABNORMAL LOW (ref 98–111)
Creatinine, Ser: 1.97 mg/dL — ABNORMAL HIGH (ref 0.44–1.00)
GFR, Estimated: 24 mL/min — ABNORMAL LOW (ref >60)
Glucose, Bld: 147 mg/dL — ABNORMAL HIGH (ref 70–99)
Potassium: 3.9 mmol/L (ref 3.5–5.1)
Sodium: 137 mmol/L (ref 135–145)
Total Bilirubin: 0.6 mg/dL (ref 0.0–1.2)
Total Protein: 7.1 g/dL (ref 6.5–8.1)

## 2023-08-20 LAB — IRON AND TIBC
Iron: 64 ug/dL (ref 28–170)
Saturation Ratios: 23 % (ref 10.4–31.8)
TIBC: 285 ug/dL (ref 250–450)
UIBC: 221 ug/dL

## 2023-08-20 LAB — CBC WITH DIFFERENTIAL/PLATELET
Abs Immature Granulocytes: 0.01 10*3/uL (ref 0.00–0.07)
Basophils Absolute: 0 10*3/uL (ref 0.0–0.1)
Basophils Relative: 0 %
Eosinophils Absolute: 0.1 10*3/uL (ref 0.0–0.5)
Eosinophils Relative: 3 %
HCT: 31.2 % — ABNORMAL LOW (ref 36.0–46.0)
Hemoglobin: 9.4 g/dL — ABNORMAL LOW (ref 12.0–15.0)
Immature Granulocytes: 0 %
Lymphocytes Relative: 22 %
Lymphs Abs: 0.6 10*3/uL — ABNORMAL LOW (ref 0.7–4.0)
MCH: 26.8 pg (ref 26.0–34.0)
MCHC: 30.1 g/dL (ref 30.0–36.0)
MCV: 88.9 fL (ref 80.0–100.0)
Monocytes Absolute: 0.3 10*3/uL (ref 0.1–1.0)
Monocytes Relative: 9 %
Neutro Abs: 1.8 10*3/uL (ref 1.7–7.7)
Neutrophils Relative %: 66 %
Platelets: 85 10*3/uL — ABNORMAL LOW (ref 150–400)
RBC: 3.51 MIL/uL — ABNORMAL LOW (ref 3.87–5.11)
RDW: 16.1 % — ABNORMAL HIGH (ref 11.5–15.5)
Smear Review: DECREASED
WBC: 2.8 10*3/uL — ABNORMAL LOW (ref 4.0–10.5)
nRBC: 0 % (ref 0.0–0.2)

## 2023-08-20 LAB — SAMPLE TO BLOOD BANK

## 2023-08-20 LAB — FERRITIN: Ferritin: 219 ng/mL (ref 11–307)

## 2023-08-20 LAB — LACTATE DEHYDROGENASE: LDH: 109 U/L (ref 98–192)

## 2023-08-20 MED ORDER — EPOETIN ALFA-EPBX 20000 UNIT/ML IJ SOLN
20000.0000 [IU] | Freq: Once | INTRAMUSCULAR | Status: AC
Start: 2023-08-20 — End: 2023-08-20
  Administered 2023-08-20: 20000 [IU] via SUBCUTANEOUS
  Filled 2023-08-20: qty 1

## 2023-08-20 NOTE — Progress Notes (Signed)
Xgeva injection given per orders. Patient tolerated it well without problems. Vitals stable and discharged home from clinic via wheelchair. Follow up as scheduled.

## 2023-08-20 NOTE — Progress Notes (Signed)
 [facility] [address], [place] [address]   CLINIC:  Medical Oncology/Hematology  PCP:  Jolinda Norene HERO, DO [address] [place] [address] [phone]  REASON FOR VISIT:  Follow-up for normocytic anemia + leukopenia/thrombocytopenia + history of breast cancer  BRIEF ONCOLOGIC HISTORY:   Oncology History Overview Note  Cancer Staging Malignant neoplasm of lower-outer quadrant of right breast of female, estrogen receptor positive (HCC) Staging form: Breast, AJCC 8th Edition - Clinical stage from 02/22/2018: Stage IA (cT1c, cN0, cM0, G1, ER+, PR+, HER2-) - Signed by Lanny Callander, MD on 03/02/2018    Malignant neoplasm of lower-outer quadrant of right breast of female, estrogen receptor positive (HCC)  02/12/2018 Mammogram   She had routine screening bilateral mammography on 02/12/2018 at [facility] with results showing: indeterminate irregular mass in the right breast.    02/16/2018 Mammogram   She underwent right diagnostic mammography with tomography and right breast ultrasonography at [facility] on 02/16/2018 showing: 1.1 cm irregular mass at the 8 O'clock position on the right breast   02/22/2018 Pathology Results   Diagnosis 1. Breast, right, needle core biopsy, lateral, 8 o'clock - INVASIVE AND IN SITU DUCTAL CARCINOMA. - SEE COMMENT. 2. Breast, right, needle core biopsy, 11 o'clock - FIBROCYSTIC CHANGES. - USUAL DUCTAL HYPERPLASIA. - THERE IS NO EVIDENCE OF MALIGNANCY.   02/22/2018 Receptors her2   IMMUNOHISTOCHEMICAL AND MORPHOMETRIC ANALYSIS PERFORMED MANUALLY Estrogen Receptor: 100%, POSITIVE, STRONG STAINING INTENSITY Progesterone Receptor: 40%, POSITIVE, MODERATE STAINING INTENSITY Proliferation Marker Ki67: 1% Her2 Negative   02/22/2018 Cancer Staging   Staging form: Breast, AJCC 8th Edition - Clinical stage from 02/22/2018: Stage IA (cT1c, cN0, cM0, G1, ER+, PR+, HER2-) - Signed by Lanny Callander, MD on 03/02/2018    02/25/2018 Initial Diagnosis    Malignant neoplasm of lower-outer quadrant of right breast of female, estrogen receptor positive (HCC)     CANCER STAGING:  Cancer Staging  Malignant neoplasm of lower-outer quadrant of right breast of female, estrogen receptor positive (HCC) Staging form: Breast, AJCC 8th Edition - Clinical stage from 02/22/2018: Stage IA (cT1c, cN0, cM0, G1, ER+, PR+, HER2-) - Signed by Lanny Callander, MD on 03/02/2018   INTERVAL HISTORY:   Ms. RENEKA NEBERGALL, a 89 y.o. female, returns for routine follow-up of her breast cancer and pancytopenia. Makhiya was last seen on 06/04/2023 by me.  She denies any recent hospitalizations, surgeries or changes to her baseline health.  She is fully recovered from hospitalization in October for pneumonia and September hospitalization for heart failure.  She started Retacrit  10,000 units every other week on 04/22/2023.  It was increased on 06/17/23 to 20,000 units every other week as hemoglobin started to trend back down.  She last received Retacrit  20,000 units on 07/30/2023.  Continues to tolerate well.  Reports thin skin, bruising and bleeding easily.   She denies any new concerning symptoms.  Denies B symptoms including fever, chills, night sweats unintentional weight loss.  She completed letrozole  for her breast cancer at the end of October 2024.  She is receiving every 33-month Prolia  injections for osteo porosis.  Her last was on 04/22/2023.  She will be due for her next injection in March 2025.  She continues oxygen  24/7.  Energy and appetite are 100%.     She continues daily oral iron  with orange juice.   ASSESSMENT   1.  History of stage I right-sided breast cancer (2019) - She had a mammogram on 02/16/2018 which showed B  RADS category 0 incomplete.  She was referred for a biopsy. - Right breast biopsy on 02/22/2018 consistent with IDC. - Right lumpectomy on 04/08/2018, IDC, 1.1 cm, grade 1, margins negative, ER/PR positive, HER2 negative, Ki-67 1%. - She declined  adjuvant radiation therapy. - Letrozole  started on 05/28/2018, which she is tolerating well  - BCI testing (see Media file from 10/22/2022) showed NO benefit from endocrine therapy extended beyond 5 years, as she has 1.9% risk of late distant recurrence regardless of whether 5 to 10 years of aromatase inhibitor treatment is given.   2.  PANCYTOPENIA - Leukopenia and thrombocytopenia since 2015 in addition to anemia that is in part related to CKD, relative iron  deficiency, and B12 deficiency. - Moderate to high suspicion for MDS or other infiltrative bone marrow disorder, but bone marrow biopsy has been declined. - CT abdomen/pelvis (01/16/2021): Normal-sized spleen.  Liver without any noted abnormalities.   - Folate normal.  Borderline B12 deficiency noted on 12/04/2021 with normal B12 but elevated methylmalonic acid 605 - Rheumatoid factor and ANA negative. - History of acute blood loss anemia requiring 2 units PRBC in January 2023. - EGD (09/01/2021) showed mild chronic gastritis and nonbleeding duodenal ulcer - She is taking ferrous sulfate  325 mg and vitamin B12 500 mcg daily.  - No bright red blood per rectum or melena. Admits to easy bruising, but denies petechial rash.  No B symptoms, frequent infections, or lymphadenopathy.  3.  Osteopenia - She is at risk for breast cancer associated bone loss in the setting of aromatase inhibitors since 2019 - DEXA scan done 03/07/2019 showed T-score -1.0 - DEXA scan from 04/23/2021 via [facility] was consistent with osteopenia (T-score -1.7) - Patient takes vitamin D  and calcium  supplements at home - FRAX from 04/23/2021 showed 10-year probability of hip fracture at 10.3% with 10-year risk of major osteoporotic fracture 17.2%, therefore started on Prolia  in February 2024.  PLAN:  1. Malignant neoplasm of lower-outer quadrant of right breast of female, estrogen receptor positive (HCC) -Most recent mammogram (03/24/2023): BI-RADS Category 2, benign.  No evidence of  breast malignancy in either breast. - History/ROS negative for any red flag symptoms of recurrent breast cancer - Most recent CMP (08/19/22) showed normal LFTs. - She completed 5 years of letrozole  at the end of October 2024. -Next mammogram will be due in August 2025.  2. Other pancytopenia (HCC) -Etiology felt to be likely due to underlying MDS and anemia in the setting of CKD, relative iron  deficiency and B12 deficiency. -Previously discussed bone marrow biopsy but given advanced age and comorbidities they have declined this at this time. -Labs from 1-25 show hemoglobin 9.4 (9.0), white blood cell count 2.8 (3.1), platelet count 85,000 (101,000) with unremarkable differential.  Peripheral smear shows normal-appearing RBC, WBC and platelets although platelets appear decreased.  Iron  levels show saturation of 23% with a ferritin of 219. -Plan is to continue ESA treatment with Retacrit  20,000 units every 2 weeks.  Originally was started at 10,000 units on 04/22/2023 but was increased to 20,000 units on 06/17/2023 for persistent low hemoglobin. -Continue iron  tablets with orange juice and vitamin B12 500 mcg daily. -Return to clinic every 2 weeks with labs (cbc, cmp) +/- Retacrit  and in 10 weeks for labs (CBC, CMP, LDH, ferritin, iron /TIBC) and office visit.   3. Osteopenia due to cancer therapy -Most recent Prolia  was given on 04/22/2023.  Next due in March 2025. -Next bone density was scheduled for 07/10/2023 but was canceled.  Patient  to reschedule. -Recommend she continue calcium , vitamin D  supplements with weightbearing exercises as she can tolerate.  4.  Hyponatremia: - Sodium level has improved to 137.  Kidney function is slightly worse at 1.97 (1.72). -She is on torsemide  40 mg each day. -Reports she tries to drink plenty of fluids but it is tough.  5. CKD: -We discussed that her kidney function continues to worsen. -Recommend she increase her fluids and follow-up with nephrology. -Labs  from 08/20/2023 show a creatinine of 1.97 (1.72) BUN 47 (45).  GFR 24 (28).   PLAN SUMMARY: >> Continue Retacrit  20,000 units every other week.  She will receive Retacrit  today. >> RTC every 2 weeks for CBC 20,000 units Retacrit  based on hemoglobin. >> Return to clinic in 10 weeks for labs (CBC/D, CMP, LDH, ferritin, iron /TIBC) plus injection last office visit.  She will also be due for her next Prolia  in March 2025.  **Next mammogram is due in August 2025.   REVIEW OF SYSTEMS:   Review of Systems  Constitutional:  Positive for fatigue.  Respiratory:  Positive for shortness of breath (At times.  Wears oxygen .).   Neurological:  Negative for dizziness, headaches and light-headedness.  Psychiatric/Behavioral:  Negative for sleep disturbance.     PHYSICAL EXAM:   Performance status (ECOG): 2 - Symptomatic, <50% confined to bed  There were no vitals filed for this visit. Wt Readings from Last 3 Encounters:  07/28/23 108 lb (49 kg)  06/12/23 104 lb 12.8 oz (47.5 kg)  06/10/23 105 lb (47.6 kg)   Physical Exam Constitutional:      Appearance: Normal appearance.  HENT:     Head: Normocephalic and atraumatic.  Eyes:     Pupils: Pupils are equal, round, and reactive to light.  Cardiovascular:     Rate and Rhythm: Normal rate and regular rhythm.     Heart sounds: Normal heart sounds. No murmur heard. Pulmonary:     Effort: Pulmonary effort is normal.     Breath sounds: Normal breath sounds. No wheezing.  Abdominal:     General: Bowel sounds are normal. There is no distension.     Palpations: Abdomen is soft.     Tenderness: There is no abdominal tenderness.  Musculoskeletal:        General: Normal range of motion.     Cervical back: Normal range of motion.  Skin:    General: Skin is warm and dry.     Findings: No rash.  Neurological:     Mental Status: She is alert and oriented to person, place, and time.  Psychiatric:        Judgment: Judgment normal.     PAST  MEDICAL/SURGICAL HISTORY:  Past Medical History:  Diagnosis Date   Anemia    Arthritis    Asthma    Atrial fibrillation (HCC)    Basal cell carcinoma 02/06/2009   Left ear targus- (MO[facility])   Basal cell carcinoma 09/28/2008   Right back-(CX35FU)   CKD (chronic kidney disease), stage III (HCC)    Heart murmur    Hyperlipidemia    Hypertension    Hypothyroidism    Nodule of right lung    Right upper lobe   Osteopenia due to cancer therapy 10/07/2022   Pneumonia    PONV (postoperative nausea and vomiting)    Renal insufficiency    Chronic   Renal vascular disease    Right renal artery stenosis (HCC) 05/09/2015   Squamous cell carcinoma of skin 09/07/2019  KA-Left shin-txpbx   Vertigo    Past Surgical History:  Procedure Laterality Date   BIOPSY  04/09/2021   Procedure: BIOPSY;  Surgeon: Eartha Angelia Sieving, MD;  [facility];  Service: Gastroenterology;;   BREAST LUMPECTOMY WITH RADIOACTIVE SEED LOCALIZATION Right 04/08/2018   Procedure: BREAST LUMPECTOMY WITH RADIOACTIVE SEED LOCALIZATION;  Surgeon: Ebbie Cough, MD;  Location: [facility] OR;  Service: General;  Laterality: Right;   COLONOSCOPY  2016   diverticulosis, hemorrhoids   ESOPHAGOGASTRODUODENOSCOPY  2016   normal   ESOPHAGOGASTRODUODENOSCOPY (EGD) WITH PROPOFOL  N/A 04/09/2021   Procedure: ESOPHAGOGASTRODUODENOSCOPY (EGD) WITH PROPOFOL ;  Surgeon: Eartha Angelia Sieving, MD;  [facility]: [facility];  Service: Gastroenterology;  Laterality: N/A;  12:45   ESOPHAGOGASTRODUODENOSCOPY (EGD) WITH PROPOFOL  N/A 09/01/2021   Procedure: ESOPHAGOGASTRODUODENOSCOPY (EGD) WITH PROPOFOL ;  Surgeon: Cindie Carlin POUR, DO;  Location: [facility];  Service: Endoscopy;  Laterality: N/A;   IR GENERIC HISTORICAL  08/29/2016   IR US  GUIDE VASC ACCESS RIGHT 08/29/2016 [facility]   IR GENERIC HISTORICAL  08/29/2016   IR RENAL BILAT S&I MOD SED 08/29/2016 [facility]   KNEE ARTHROSCOPY Right    2019   THYROIDECTOMY,  PARTIAL Right 1981   middle lobe removed 1st, then right lobe removed 7-8 years later (approx. 1988)    SOCIAL HISTORY:  Social History   Socioeconomic History   Marital status: Widowed    Spouse name: Not on file   Number of children: 2   Years of education: 84   Highest education level: Not on file  Occupational History   Occupation: Retired  Tobacco Use   Smoking status: Never   Smokeless tobacco: Never  Vaping Use   Vaping status: Never Used  Substance and Sexual Activity   Alcohol  use: No   Drug use: No   Sexual activity: Not Currently  Other Topics Concern   Not on file  Social History Narrative   Lives alone, but her daughter stays there at night   Ambidextrous (writes with left hand).   No caffeine use.   Social Drivers of Corporate Investment Banker Strain: Low Risk  (12/15/2022)   Overall Financial Resource Strain (CARDIA)    Difficulty of Paying Living Expenses: Not hard at all  Food Insecurity: No Food Insecurity (05/28/2023)   Hunger Vital Sign    Worried About Running Out of Food in the Last Year: Never true    Ran Out of Food in the Last Year: Never true  Transportation Needs: No Transportation Needs (05/28/2023)   PRAPARE - Administrator, Civil Service (Medical): No    Lack of Transportation (Non-Medical): No  Physical Activity: Inactive (12/15/2022)   Exercise Vital Sign    Days of Exercise per Week: 0 days    Minutes of Exercise per Session: 0 min  Stress: No Stress Concern Present (12/15/2022)   [employer] of Occupational Health - Occupational Stress Questionnaire    Feeling of Stress : Not at all  Social Connections: Moderately Isolated (12/15/2022)   Social Connection and Isolation Panel [NHANES]    Frequency of Communication with Friends and Family: More than three times a week    Frequency of Social Gatherings with Friends and Family: Three times a week    Attends Religious Services: More than 4 times per year    Active  Member of Clubs or Organizations: No    Attends Banker Meetings: Never    Marital Status: Widowed  Intimate Partner Violence:  Not At Risk (05/22/2023)   Humiliation, Afraid, Rape, and Kick questionnaire    Fear of Current or Ex-Partner: No    Emotionally Abused: No    Physically Abused: No    Sexually Abused: No    FAMILY HISTORY:  Family History  Problem Relation Age of Onset   Thyroid  disease Mother    Stroke Father    Hypertension Brother    Lung cancer Maternal Uncle    Cancer Maternal Grandmother        Liver   Lung cancer Maternal Uncle    Hypertension Daughter    Hypercholesterolemia Daughter    Hypercholesterolemia Daughter     CURRENT MEDICATIONS:  Current Outpatient Medications  Medication Sig Dispense Refill   atorvastatin  (LIPITOR) 20 MG tablet TAKE 1/2 TABLET BY MOUTH DAILY (Patient taking differently: Take 10 mg by mouth at bedtime.) 45 tablet 1   azelastine  (ASTELIN ) 0.1 % nasal spray Place 1 spray into both nostrils 2 (two) times daily. For runny nose 30 mL 12   bisoprolol  (ZEBETA ) 5 MG tablet TAKE 1 TABLET (5 MG TOTAL) BY MOUTH DAILY. 90 tablet 0   feeding supplement (ENSURE ENLIVE / ENSURE PLUS) LIQD Take 237 mLs by mouth 2 (two) times daily between meals. (Patient taking differently: Take 237 mLs by mouth daily as needed (decreased appetite).) 237 mL 12   ferrous sulfate  325 (65 FE) MG tablet Take 325 mg by mouth daily with breakfast.     flecainide  (TAMBOCOR ) 50 MG tablet TAKE 1 TABLET BY MOUTH TWICE A DAY 180 tablet 3   Fluticasone -Umeclidin-Vilant (TRELEGY ELLIPTA ) 100-62.5-25 MCG/ACT AEPB Inhale 1 puff into the lungs daily.     hydrALAZINE  (APRESOLINE ) 50 MG tablet TAKE 1 TABLET BY MOUTH TWICE A DAY 180 tablet 3   levothyroxine  (SYNTHROID ) 75 MCG tablet TAKE 1 TABLET BY MOUTH EVERY DAY (Patient taking differently: Take 75 mcg by mouth daily before breakfast.) 90 tablet 3   mirtazapine  (REMERON ) 7.5 MG tablet TAKE 1 TABLET BY MOUTH EVERYDAY AT  BEDTIME 90 tablet 3   Multiple Vitamins-Minerals (MULTIVITAMIN WOMEN 50+) TABS Take 1 tablet by mouth at bedtime.     pantoprazole  (PROTONIX ) 40 MG tablet Take 1 tablet (40 mg total) by mouth daily. 90 tablet 1   predniSONE  (DELTASONE ) 10 MG tablet Take 4 tabs daily with food for 4 days, then 3 tabs daily for 4 days, then 2 tabs daily for 4 days, then 1 tab for 4 days then stop. 40 tablet 0   torsemide  (DEMADEX ) 20 MG tablet Take 2 tablets (40 mg total) by mouth daily. Take extra 20mg  if gained more than 3 pounds overnight and/or more than 5 pounds in a week 60 tablet 1   No current facility-administered medications for this visit.    ALLERGIES:  Allergies  Allergen Reactions   Norvasc  [Amlodipine ] Swelling    Edema    Tape Other (See Comments)    Has to have no stick tape.   Zestril  [Lisinopril ] Cough   Hytrin  [Terazosin ] Hives and Nausea Only   Imdur  [Isosorbide  Nitrate] Nausea Only   Sulfonamide Derivatives Nausea And Vomiting and Rash    LABORATORY DATA:  I have reviewed the labs as listed.     Latest Ref Rng & Units 07/30/2023   12:48 PM 07/15/2023   12:34 PM 07/01/2023   10:19 AM  CBC  WBC 4.0 - 10.5 K/uL 3.1  1.9  2.4   Hemoglobin 12.0 - 15.0 g/dL 9.0  9.0  8.8  Hematocrit 36.0 - 46.0 % 28.8  28.9  28.1   Platelets 150 - 400 K/uL 101  115  107       Latest Ref Rng & Units 07/30/2023   12:48 PM 07/15/2023   12:34 PM 06/17/2023   10:12 AM  CMP  Glucose 70 - 99 mg/dL 817  895  98   BUN 8 - 23 mg/dL 45  33  33   Creatinine 0.44 - 1.00 mg/dL 8.27  8.69  8.44   Sodium 135 - 145 mmol/L 134  137  130   Potassium 3.5 - 5.1 mmol/L 4.0  4.0  3.8   Chloride 98 - 111 mmol/L 91  94  87   CO2 22 - 32 mmol/L 33  35  36   Calcium  8.9 - 10.3 mg/dL 8.8  8.4  8.1   Total Protein 6.5 - 8.1 g/dL 7.4  6.6  6.8   Total Bilirubin <1.2 mg/dL 0.7  0.8  0.7   Alkaline Phos 38 - 126 U/L 42  37  41   AST 15 - 41 U/L 29  20  22    ALT 0 - 44 U/L 14  12  12      DIAGNOSTIC IMAGING:  I  have independently reviewed the scans and discussed with the patient. DG Chest 2 View Result Date: 08/03/2023 CLINICAL DATA:  89 year old female with pneumonia EXAM: CHEST - 2 VIEW COMPARISON:  05/23/2023, CT 05/29/2023 FINDINGS: Cardiomediastinal silhouette unchanged with cardiomegaly. Improving reticulonodular opacities of the bilateral lungs compared to the prior. There is some persisting interlobular septal thickening with meniscus at the right lung base an blunting of the costophrenic angle. No pneumothorax. Meniscus in the costophrenic sulcus. No new airspace disease. Degenerative changes of the spine.  No displaced fracture IMPRESSION: Improving multifocal pneumonia. Mild pulmonary edema and trace right-sided pleural effusion. Electronically Signed   By: Ami Bellman D.O.   On: 08/03/2023 11:40     WRAP UP:  All questions were answered. The patient knows to call the clinic with any problems, questions or concerns.  Medical decision making: Moderate  Time spent on visit I spent 25 minutes dedicated to the care of this patient (face-to-face and non-face-to-face) on the date of the encounter to include what is described in the assessment and plan.   Delon FORBES Hope, NP  08/20/23 12:53 PM

## 2023-08-21 ENCOUNTER — Ambulatory Visit: Payer: PPO

## 2023-08-21 ENCOUNTER — Telehealth: Payer: Self-pay | Admitting: Family Medicine

## 2023-08-21 NOTE — Telephone Encounter (Signed)
 Copied from CRM [id number]. Topic: Clinical - Home Health Verbal Orders >> Aug 21, 2023  2:54 PM Farrel NOVAK wrote: Apolinar from [facility] Callback Number: [phone] Service Requested: Home health certification and plan of care from 10/5-12/13 Frequency: Caller is following up on order and states the order is over a month old now Any new concerns about the patient? No

## 2023-08-21 NOTE — Telephone Encounter (Signed)
 Copied from CRM [id number]. Topic: Clinical - Home Health Verbal Orders >> Aug 21, 2023  3:47 PM Alfonso ORN wrote: Caller/Agency: Apolinar from [facility]  Callback Number: [phone] Service Requested: Home Health Certification and plan of care from 06/02/23 -07/31/23 Frequency: N/A Any new concerns about the patient?  The order is over a month old

## 2023-08-21 NOTE — Telephone Encounter (Signed)
 Paperwork was scanned in to chart already.  I printed and refaxed.

## 2023-08-25 ENCOUNTER — Ambulatory Visit: Payer: PPO

## 2023-08-25 NOTE — Telephone Encounter (Signed)
 I'm confused as to what the question is. I have not seen patient since October.  Do they have new concerns?  Her care is being managed by hematology/ oncology/ pulmonology primarily right now. Glad to get her in if new concerns have arisen.

## 2023-09-01 ENCOUNTER — Ambulatory Visit (HOSPITAL_BASED_OUTPATIENT_CLINIC_OR_DEPARTMENT_OTHER): Payer: PPO | Admitting: Pulmonary Disease

## 2023-09-01 ENCOUNTER — Encounter (HOSPITAL_BASED_OUTPATIENT_CLINIC_OR_DEPARTMENT_OTHER): Payer: Self-pay | Admitting: Pulmonary Disease

## 2023-09-01 ENCOUNTER — Ambulatory Visit (HOSPITAL_BASED_OUTPATIENT_CLINIC_OR_DEPARTMENT_OTHER): Payer: PPO

## 2023-09-01 VITALS — BP 168/58 | HR 74 | Resp 14 | Ht 64.0 in | Wt 109.8 lb

## 2023-09-01 DIAGNOSIS — J9611 Chronic respiratory failure with hypoxia: Secondary | ICD-10-CM

## 2023-09-01 DIAGNOSIS — J189 Pneumonia, unspecified organism: Secondary | ICD-10-CM

## 2023-09-01 DIAGNOSIS — R918 Other nonspecific abnormal finding of lung field: Secondary | ICD-10-CM | POA: Diagnosis not present

## 2023-09-01 MED ORDER — ALBUTEROL SULFATE HFA 108 (90 BASE) MCG/ACT IN AERS: 2.0000 | INHALATION_SPRAY | Freq: Four times a day (QID) | RESPIRATORY_TRACT | 6 refills | Status: DC | PRN

## 2023-09-01 NOTE — Progress Notes (Signed)
 Subjective:    Patient ID: [name], female    DOB: [birth year], 89 y.o.   MRN: [id number]  HPI  89 year old never smoker  for FU of chronic respiratory failure due to CHF /pneumonia  She has been seen by [facility] in 08/2021 for asthmatic bronchitis after she had hospitalizations for pneumonia' >> continued on Symbicort  -it seems she was never diagnosed with asthma prior to this     PMH :CKD 3a, HTN,  Chr Diastolic CHF, A fib, mod AS - considered high risk even for TAVR Hypothyroidism, Rt renal artery stenosis, Vertigo, Breast cancer, Esophageal candidiasis  Leukopenia-advised bone marrow biopsy, refused , presumed myelodysplastic syndrome on Procrit every 2 weeks DNR   Initial OV 04/2023  She was hospitalized 8/15 to 8/17 for shortness of breath and noted to be hypoxic to 70%, she was discharged on 2 L oxygen   She has bibasal crackles, review of previous imaging shows increased interstitial infiltrates in 05/2022 which resolved suggesting that this was also pulmonary edema. >> sent an order to [employer] for a lightweight POC   She was hospitalized 05/2023 for bilateral pneumonia, had leukocytosis and increased procalcitonin, treated with antibiotics and improved.  CT scan was done soon after her discharge for pneumonia and showed patchy bilateral infiltrates   OV 07/28/23 >> trelegy, pred x 2 week CXR >> Improving multifocal pneumonia.   Discussed the use of AI scribe software for clinical note transcription with the patient, who gave verbal consent to proceed.  History of Present Illness   The patient, with a history of asthmatic bronchitis and multifocal pneumonia, presents for a follow-up visit. The patient reports a noticeable improvement in her breathing since the last visit, but still experiences significant drops in oxygen  levels when not on oxygen  therapy. The patient's oxygen  levels can drop to as low as 76 when active and around 86 when at rest without oxygen . The patient also  reports that the coughing and wheezing symptoms have improved since starting on prednisone . The patient is currently on torsemide  for fluid management and has been using an inhaler, but reports no noticeable improvement from the inhaler. The patient also has a heart valve murmur, which is being monitored but not actively treated due to the patient's age and overall health status.        Significant tests/ events reviewed PFTs 06/2023 moderate airway obstruction ratio 65, FEV1 42%, FVC 47%, no bronchodilator response, TLC normal, DLCO 40%   HRCT chest 05/2023 >>  Multifocal nodular consolidation in the lungs with septal thickening and ground-glass. Findings may be due to pneumonia. Given nodularity and thickening along the fissures, as well as minimally hyperdense mediastinal adenopathy, consider alveolar sarcoid   04/22/23 Oxygen  saturation at rest on room air was 88% and dropped to 83% on ambulating 1 lap,  POC 2 L to 83%    03/2023 echo: LVEF 60-65%, grade II dd, mod MS mean grad 6, mod to severe AS mean grad 34, AVA VTI 0.93 DI 0.41    05/2022 VQ scan negative. CT abdomen 01/2021 shows clear lungs   Review of Systems neg for any significant sore throat, dysphagia, itching, sneezing, nasal congestion or excess/ purulent secretions, fever, chills, sweats, unintended wt loss, pleuritic or exertional cp, hempoptysis, orthopnea pnd or change in chronic leg swelling. Also denies presyncope, palpitations, heartburn, abdominal pain, nausea, vomiting, diarrhea or change in bowel or urinary habits, dysuria,hematuria, rash, arthralgias, visual complaints, headache, numbness weakness or ataxia.     Objective:  Physical Exam  Gen. Pleasant, well-nourished, in no distress, elderly, on O2 ENT - no thrush, no pallor/icterus,no post nasal drip Neck: No JVD, no thyromegaly, no carotid bruits Lungs: no use of accessory muscles, no dullness to percussion, clear without rales or rhonchi  Cardiovascular:  Rhythm regular, heart sounds  normal, no murmurs or gallops, no peripheral edema Musculoskeletal: No deformities, no cyanosis or clubbing        Assessment & Plan:   Assessment and Plan    Chronic resp failure with hypoxia - can stay off O2 at est desaturates on ambulation to 88% , continue POC on walking & O2 during sleep   Chronic Obstructive Pulmonary Disease (COPD)   Reports improvement in breathing but significant drops in oxygen  levels when off supplemental oxygen . Oxygen  saturation drops to 76% with activity and 86% at rest without oxygen . No current wheezing or coughing. Previous prednisone  course and Trelegy inhaler were given. Currently on torsemide  40 mg daily. History of asthmatic bronchitis and multifocal pneumonia, with follow-up chest x-ray in December showing improvement. Still dependent on supplemental oxygen . Discussed necessity of oxygen  during activity and sleep due to low saturation levels. Inhaler efficacy is uncertain, and albuterol  will be provided for as-needed use.   - Order chest x-ray to ensure resolution of pneumonia   - Plan follow-up CT scan to check for scarring or fibrosis   - Discontinue Trelegy inhaler if not effective   - Prescribe albuterol  inhaler for as-needed use   - Monitor oxygen  levels during walking test to determine necessary oxygen  flow rate    Aortic stenosis. Cardiologists advised against surgical intervention due to high risk. Monitoring condition as per cardiology recommendations. Discussed that surgical options are not viable due to age and comorbidities, making her a high-risk candidate.   - Continue monitoring heart valve condition as per cardiology recommendations    General Health Maintenance   HFpEF well-managed. On torsemide  40 mg daily for fluid management.   - Continue torsemide  40 mg daily

## 2023-09-01 NOTE — Patient Instructions (Signed)
 CXR today - based on this, we will decide about CT scan  X Rx for albuterol MDI -2 puffs every 6h as needed

## 2023-09-02 ENCOUNTER — Ambulatory Visit: Payer: PPO | Admitting: Family Medicine

## 2023-09-03 ENCOUNTER — Inpatient Hospital Stay: Payer: PPO

## 2023-09-03 VITALS — BP 141/57 | HR 56 | Temp 96.3°F | Resp 16

## 2023-09-03 DIAGNOSIS — N1832 Chronic kidney disease, stage 3b: Secondary | ICD-10-CM | POA: Diagnosis not present

## 2023-09-03 DIAGNOSIS — D649 Anemia, unspecified: Secondary | ICD-10-CM

## 2023-09-03 DIAGNOSIS — M8580 Other specified disorders of bone density and structure, unspecified site: Secondary | ICD-10-CM

## 2023-09-03 LAB — CBC
HCT: 29.6 % — ABNORMAL LOW (ref 36.0–46.0)
Hemoglobin: 9.5 g/dL — ABNORMAL LOW (ref 12.0–15.0)
MCH: 28.1 pg (ref 26.0–34.0)
MCHC: 32.1 g/dL (ref 30.0–36.0)
MCV: 87.6 fL (ref 80.0–100.0)
Platelets: 107 10*3/uL — ABNORMAL LOW (ref 150–400)
RBC: 3.38 MIL/uL — ABNORMAL LOW (ref 3.87–5.11)
RDW: 16.1 % — ABNORMAL HIGH (ref 11.5–15.5)
WBC: 1.8 10*3/uL — ABNORMAL LOW (ref 4.0–10.5)
nRBC: 0 % (ref 0.0–0.2)

## 2023-09-03 LAB — SAMPLE TO BLOOD BANK

## 2023-09-03 MED ORDER — EPOETIN ALFA-EPBX 20000 UNIT/ML IJ SOLN
20000.0000 [IU] | Freq: Once | INTRAMUSCULAR | Status: AC
Start: 2023-09-03 — End: 2023-09-03
  Administered 2023-09-03: 20000 [IU] via SUBCUTANEOUS
  Filled 2023-09-03: qty 1

## 2023-09-03 NOTE — Patient Instructions (Addendum)
 [facility] - A DEPT OF [facility]  Discharge Instructions: Thank you for choosing [facility] to provide your oncology and hematology care.  If you have a lab appointment with the Cancer Center - please note that after April 8th, 2024, all labs will be drawn in the cancer center.  You do not have to check in or register with the main entrance as you have in the past but will complete your check-in in the cancer center.  Wear comfortable clothing and clothing appropriate for easy access to any Portacath or PICC line.   We strive to give you quality time with your provider. You may need to reschedule your appointment if you arrive late (15 or more minutes).  Arriving late affects you and other patients whose appointments are after yours.  Also, if you miss three or more appointments without notifying the office, you may be dismissed from the clinic at the provider's discretion.      For prescription refill requests, have your pharmacy contact our office and allow 72 hours for refills to be completed.    Today you received the following:  Retacrit.  Epoetin Alfa Injection What is this medication? EPOETIN ALFA (e POE e tin AL fa) treats low levels of red blood cells (anemia) caused by kidney disease, chemotherapy, or HIV medications. It can also be used in people who are at risk for blood loss during surgery. It works by Systems analyst make more red blood cells, which reduces the need for blood transfusions. This medicine may be used for other purposes; ask your health care provider or pharmacist if you have questions. COMMON BRAND NAME(S): Epogen, Procrit, Retacrit What should I tell my care team before I take this medication? They need to know if you have any of these conditions: Blood clots Cancer Heart disease High blood pressure On dialysis Seizures Stroke An unusual or allergic reaction to epoetin alfa, albumin, benzyl alcohol, other  medications, foods, dyes, or preservatives Pregnant or trying to get pregnant Breast-feeding How should I use this medication? This medication is injected into a vein or under the skin. It is usually given by your care team in a hospital or clinic setting. It may also be given at home. If you get this medication at home, you will be taught how to prepare and give it. Use exactly as directed. Take it as directed on the prescription label at the same time every day. Keep taking it unless your care team tells you to stop. It is important that you put your used needles and syringes in a special sharps container. Do not put them in a trash can. If you do not have a sharps container, call your pharmacist or care team to get one. A special MedGuide will be given to you by the pharmacist with each prescription and refill. Be sure to read this information carefully each time. Talk to your care team about the use of this medication in children. While this medication may be used in children as young as 1 month of age for selected conditions, precautions do apply. Overdosage: If you think you have taken too much of this medicine contact a poison control center or emergency room at once. NOTE: This medicine is only for you. Do not share this medicine with others. What if I miss a dose? If you miss a dose, take it as soon as you can. If it is almost time for your next  dose, take only that dose. Do not take double or extra doses. What may interact with this medication? Darbepoetin alfa Methoxy polyethylene glycol-epoetin beta This list may not describe all possible interactions. Give your health care provider a list of all the medicines, herbs, non-prescription drugs, or dietary supplements you use. Also tell them if you smoke, drink alcohol, or use illegal drugs. Some items may interact with your medicine. What should I watch for while using this medication? Visit your care team for regular checks on your  progress. Check your blood pressure as directed. Know what your blood pressure should be and when to contact your care team. Your condition will be monitored carefully while you are receiving this medication. You may need blood work while taking this medication. What side effects may I notice from receiving this medication? Side effects that you should report to your care team as soon as possible: Allergic reactions--skin rash, itching, hives, swelling of the face, lips, tongue, or throat Blood clot--pain, swelling, or warmth in the leg, shortness of breath, chest pain Heart attack--pain or tightness in the chest, shoulders, arms, or jaw, nausea, shortness of breath, cold or clammy skin, feeling faint or lightheaded Increase in blood pressure Rash, fever, and swollen lymph nodes Redness, blistering, peeling, or loosening of the skin, including inside the mouth Seizures Stroke--sudden numbness or weakness of the face, arm, or leg, trouble speaking, confusion, trouble walking, loss of balance or coordination, dizziness, severe headache, change in vision Side effects that usually do not require medical attention (report to your care team if they continue or are bothersome): Bone, joint, or muscle pain Cough Headache Nausea Pain, redness, or irritation at injection site This list may not describe all possible side effects. Call your doctor for medical advice about side effects. You may report side effects to FDA at 1-800-FDA-1088. Where should I keep my medication? Keep out of the reach of children and pets. Store in a refrigerator. Do not freeze. Do not shake. Protect from light. Keep this medication in the original container until you are ready to take it. See product for storage information. Get rid of any unused medication after the expiration date. To get rid of medications that are no longer needed or have expired: Take the medication to a medication take-back program. Check with your  pharmacy or law enforcement to find a location. If you cannot return the medication, ask your pharmacist or care team how to get rid of the medication safely. NOTE: This sheet is a summary. It may not cover all possible information. If you have questions about this medicine, talk to your doctor, pharmacist, or health care provider.  2024 Elsevier/Gold Standard (2021-12-06 00:00:00)     To help prevent nausea and vomiting after your treatment, we encourage you to take your nausea medication as directed.  BELOW ARE SYMPTOMS THAT SHOULD BE REPORTED IMMEDIATELY: *FEVER GREATER THAN 100.4 F (38 C) OR HIGHER *CHILLS OR SWEATING *NAUSEA AND VOMITING THAT IS NOT CONTROLLED WITH YOUR NAUSEA MEDICATION *UNUSUAL SHORTNESS OF BREATH *UNUSUAL BRUISING OR BLEEDING *URINARY PROBLEMS (pain or burning when urinating, or frequent urination) *BOWEL PROBLEMS (unusual diarrhea, constipation, pain near the anus) TENDERNESS IN MOUTH AND THROAT WITH OR WITHOUT PRESENCE OF ULCERS (sore throat, sores in mouth, or a toothache) UNUSUAL RASH, SWELLING OR PAIN  UNUSUAL VAGINAL DISCHARGE OR ITCHING   Items with * indicate a potential emergency and should be followed up as soon as possible or go to the Emergency Department if any problems should  occur.  Please show the CHEMOTHERAPY ALERT CARD or IMMUNOTHERAPY ALERT CARD at check-in to the Emergency Department and triage nurse.  Should you have questions after your visit or need to cancel or reschedule your appointment, please contact [facility] [place] - A DEPT OF [facility] [phone]  and follow the prompts.  Office hours are 8:00 a.m. to 4:30 p.m. Monday - Friday. Please note that voicemails left after 4:00 p.m. may not be returned until the following business day.  We are closed weekends and major holidays. You have access to a nurse at all times for urgent questions. Please call the main number to the clinic [phone] and follow the  prompts.  For any non-urgent questions, you may also contact your provider using MyChart. We now offer e-Visits for anyone 42 and older to request care online for non-urgent symptoms. For details visit [email or web address].   Also download the MyChart app! Go to the app store, search "MyChart", open the app, select [facility], and log in with your MyChart username and password.

## 2023-09-03 NOTE — Progress Notes (Signed)
Hemoglobin today is 9.5.  We will proceed with Retacrit per provider orders.

## 2023-09-07 ENCOUNTER — Other Ambulatory Visit (HOSPITAL_BASED_OUTPATIENT_CLINIC_OR_DEPARTMENT_OTHER): Payer: Self-pay

## 2023-09-07 DIAGNOSIS — J189 Pneumonia, unspecified organism: Secondary | ICD-10-CM

## 2023-09-07 DIAGNOSIS — J849 Interstitial pulmonary disease, unspecified: Secondary | ICD-10-CM

## 2023-09-07 DIAGNOSIS — J9611 Chronic respiratory failure with hypoxia: Secondary | ICD-10-CM

## 2023-09-07 NOTE — Progress Notes (Signed)
Order placed

## 2023-09-09 ENCOUNTER — Telehealth: Payer: Self-pay | Admitting: Family Medicine

## 2023-09-09 ENCOUNTER — Other Ambulatory Visit: Payer: Self-pay | Admitting: Family Medicine

## 2023-09-09 NOTE — Telephone Encounter (Signed)
  Prescription Request  09/09/2023  Is this a "Controlled Substance" medicine? No  Have you seen your PCP in the last 2 weeks? NO  If YES, route message to pool  -  If NO, patient needs to be scheduled for appointment.  What is the name of the medication or equipment? Torsemide 20 mg Has one left  Have you contacted your pharmacy to request a refill? NO   Which pharmacy would you like this sent to? [facility]   Patient notified that their request is being sent to the clinical staff for review and that they should receive a response within 2 business days.

## 2023-09-10 NOTE — Telephone Encounter (Signed)
Script sent in yesterday, pt was able to pick it up then

## 2023-09-13 DIAGNOSIS — J449 Chronic obstructive pulmonary disease, unspecified: Secondary | ICD-10-CM | POA: Diagnosis not present

## 2023-09-15 ENCOUNTER — Other Ambulatory Visit: Payer: Self-pay | Admitting: Cardiology

## 2023-09-16 ENCOUNTER — Ambulatory Visit: Payer: PPO | Admitting: Family Medicine

## 2023-09-16 ENCOUNTER — Encounter: Payer: Self-pay | Admitting: Family Medicine

## 2023-09-16 ENCOUNTER — Ambulatory Visit (INDEPENDENT_AMBULATORY_CARE_PROVIDER_SITE_OTHER): Payer: PPO

## 2023-09-16 VITALS — BP 146/60 | HR 60 | Temp 98.6°F | Ht 64.0 in | Wt 110.4 lb

## 2023-09-16 DIAGNOSIS — N184 Chronic kidney disease, stage 4 (severe): Secondary | ICD-10-CM

## 2023-09-16 DIAGNOSIS — Z780 Asymptomatic menopausal state: Secondary | ICD-10-CM

## 2023-09-16 DIAGNOSIS — I5032 Chronic diastolic (congestive) heart failure: Secondary | ICD-10-CM

## 2023-09-16 DIAGNOSIS — M8589 Other specified disorders of bone density and structure, multiple sites: Secondary | ICD-10-CM | POA: Diagnosis not present

## 2023-09-16 DIAGNOSIS — J9611 Chronic respiratory failure with hypoxia: Secondary | ICD-10-CM

## 2023-09-16 NOTE — Progress Notes (Signed)
Subjective: CC: Interval checkup PCP: [name], DO [id number] B [name] is a 89 y.o. female presenting to clinic today for:  1.  Heart failure associated with atrial fibrillation associated with CKD The patient is compliant with office visits with her specialists.  She just saw her pulmonologist.  They have tried to work to get her off of oxygen but sadly have been unsuccessful.  She has some imaging studies coming up soon.  She reports no chest pain.  She feels like her breathing is baseline on oxygen.  Denies any lower extremity edema.  She monitors her blood pressures very closely at home and they have been running typically 120s over 60s to 70s.  She monitors her weight really closely and she is typically somewhere between 108 pounds and 109 pounds   ROS: Per HPI  Allergies  Allergen Reactions   Norvasc [Amlodipine] Swelling    Edema    Tape Other (See Comments)    Has to have no stick tape.   Zestril [Lisinopril] Cough   Hytrin [Terazosin] Hives and Nausea Only   Imdur [Isosorbide Nitrate] Nausea Only   Sulfonamide Derivatives Nausea And Vomiting and Rash   Past Medical History:  Diagnosis Date   Anemia    Arthritis    Asthma    Atrial fibrillation (HCC)    Basal cell carcinoma 02/06/2009   Left ear targus- (MOHS)   Basal cell carcinoma 09/28/2008   Right back-(CX35FU)   CKD (chronic kidney disease), stage III (HCC)    Heart murmur    Hyperlipidemia    Hypertension    Hypothyroidism    Nodule of right lung    Right upper lobe   Osteopenia due to cancer therapy 10/07/2022   Pneumonia    PONV (postoperative nausea and vomiting)    Renal insufficiency    Chronic   Renal vascular disease    Right renal artery stenosis (HCC) 05/09/2015   Squamous cell carcinoma of skin 09/07/2019   KA-Left shin-txpbx   Vertigo     Current Outpatient Medications:    albuterol (VENTOLIN HFA) 108 (90 Base) MCG/ACT inhaler, Inhale 2 puffs into the lungs every 6 (six) hours  as needed for wheezing or shortness of breath., Disp: 8 g, Rfl: 6   atorvastatin (LIPITOR) 20 MG tablet, TAKE 1/2 TABLET BY MOUTH DAILY (Patient taking differently: Take 10 mg by mouth at bedtime.), Disp: 45 tablet, Rfl: 1   azelastine (ASTELIN) 0.1 % nasal spray, Place 1 spray into both nostrils 2 (two) times daily. For runny nose, Disp: 30 mL, Rfl: 12   bisoprolol (ZEBETA) 5 MG tablet, TAKE 1 TABLET (5 MG TOTAL) BY MOUTH DAILY., Disp: 90 tablet, Rfl: 0   feeding supplement (ENSURE ENLIVE / ENSURE PLUS) LIQD, Take 237 mLs by mouth 2 (two) times daily between meals. (Patient taking differently: Take 237 mLs by mouth daily as needed (decreased appetite).), Disp: 237 mL, Rfl: 12   ferrous sulfate 325 (65 FE) MG tablet, Take 325 mg by mouth daily with breakfast., Disp: , Rfl:    flecainide (TAMBOCOR) 50 MG tablet, TAKE 1 TABLET BY MOUTH TWICE A DAY, Disp: 180 tablet, Rfl: 3   Fluticasone-Umeclidin-Vilant (TRELEGY ELLIPTA) 100-62.5-25 MCG/ACT AEPB, Inhale 1 puff into the lungs daily., Disp: , Rfl:    hydrALAZINE (APRESOLINE) 50 MG tablet, TAKE 1 TABLET BY MOUTH TWICE A DAY, Disp: 180 tablet, Rfl: 3   levothyroxine (SYNTHROID) 75 MCG tablet, TAKE 1 TABLET BY MOUTH EVERY DAY (Patient taking differently: Take  75 mcg by mouth daily before breakfast.), Disp: 90 tablet, Rfl: 3   mirtazapine (REMERON) 7.5 MG tablet, TAKE 1 TABLET BY MOUTH EVERYDAY AT BEDTIME, Disp: 90 tablet, Rfl: 3   Multiple Vitamins-Minerals (MULTIVITAMIN WOMEN 50+) TABS, Take 1 tablet by mouth at bedtime., Disp: , Rfl:    pantoprazole (PROTONIX) 40 MG tablet, Take 1 tablet (40 mg total) by mouth daily., Disp: 90 tablet, Rfl: 1   torsemide (DEMADEX) 20 MG tablet, 2 TABS (40 MG) DAILY. TAKE EXTRA TAB IF GAIN OVER 3 POUNDS OVERNIGHT AND/OR OVER 5 POUNDS IN A WEEK, Disp: 60 tablet, Rfl: 0 Social History   Socioeconomic History   Marital status: Widowed    Spouse name: Not on file   Number of children: 2   Years of education: 4   Highest  education level: Not on file  Occupational History   Occupation: Retired  Tobacco Use   Smoking status: Never   Smokeless tobacco: Never  Vaping Use   Vaping status: Never Used  Substance and Sexual Activity   Alcohol use: No   Drug use: No   Sexual activity: Not Currently  Other Topics Concern   Not on file  Social History Narrative   Lives alone, but her daughter stays there at night   Ambidextrous (writes with left hand).   No caffeine use.   Social Drivers of Corporate investment banker Strain: Low Risk  (12/15/2022)   Overall Financial Resource Strain (CARDIA)    Difficulty of Paying Living Expenses: Not hard at all  Food Insecurity: No Food Insecurity (05/28/2023)   Hunger Vital Sign    Worried About Running Out of Food in the Last Year: Never true    Ran Out of Food in the Last Year: Never true  Transportation Needs: No Transportation Needs (05/28/2023)   PRAPARE - Administrator, Civil Service (Medical): No    Lack of Transportation (Non-Medical): No  Physical Activity: Inactive (12/15/2022)   Exercise Vital Sign    Days of Exercise per Week: 0 days    Minutes of Exercise per Session: 0 min  Stress: No Stress Concern Present (12/15/2022)   [employer] of Occupational Health - Occupational Stress Questionnaire    Feeling of Stress : Not at all  Social Connections: Moderately Isolated (12/15/2022)   Social Connection and Isolation Panel [NHANES]    Frequency of Communication with Friends and Family: More than three times a week    Frequency of Social Gatherings with Friends and Family: Three times a week    Attends Religious Services: More than 4 times per year    Active Member of Clubs or Organizations: No    Attends Banker Meetings: Never    Marital Status: Widowed  Intimate Partner Violence: Not At Risk (05/22/2023)   Humiliation, Afraid, Rape, and Kick questionnaire    Fear of Current or Ex-Partner: No    Emotionally Abused: No     Physically Abused: No    Sexually Abused: No   Family History  Problem Relation Age of Onset   Thyroid disease Mother    Stroke Father    Hypertension Brother    Lung cancer Maternal Uncle    Cancer Maternal Grandmother        Liver   Lung cancer Maternal Uncle    Hypertension Daughter    Hypercholesterolemia Daughter    Hypercholesterolemia Daughter     Objective: Office vital signs reviewed. BP (!) 146/60   Pulse 60  Temp 98.6 F (37 C)   Ht 5\' 4"  (1.626 m)   Wt 110 lb 6.4 oz (50.1 kg)   SpO2 99%   BMI 18.95 kg/m   Physical Examination:  General: Awake, alert, thin elderly female, No acute distress HEENT: Sclera white.  Moist mucous membranes Cardio: regular rate and rhythm, S1S2 heard, loud murmurs appreciated Pulm: clear to auscultation bilaterally, mildly coarse breath sounds in the bases right greater than left.  No rhonchi or rales; normal work of breathing on room supplemental oxygen by nasal cannula Extremities: No edema  Assessment/ Plan: 89 y.o. female   Stage 4 chronic kidney disease (HCC)  Chronic respiratory failure with hypoxia (HCC)  Chronic diastolic heart failure (HCC)  Continue to follow-up with nephrology, pulmonology and cardiology.  She is stable for now.  We will plan to see her in May since she has interval follow-up with specialists in March   Norissa Bartee M Nadine Counts, DO [facility] [phone]

## 2023-09-17 ENCOUNTER — Other Ambulatory Visit: Payer: Self-pay | Admitting: Family Medicine

## 2023-09-17 ENCOUNTER — Inpatient Hospital Stay: Payer: PPO

## 2023-09-17 VITALS — BP 152/53 | HR 56 | Resp 18

## 2023-09-17 DIAGNOSIS — D649 Anemia, unspecified: Secondary | ICD-10-CM

## 2023-09-17 DIAGNOSIS — N1832 Chronic kidney disease, stage 3b: Secondary | ICD-10-CM | POA: Diagnosis not present

## 2023-09-17 DIAGNOSIS — M8580 Other specified disorders of bone density and structure, unspecified site: Secondary | ICD-10-CM

## 2023-09-17 LAB — SAMPLE TO BLOOD BANK

## 2023-09-17 LAB — CBC
HCT: 28.8 % — ABNORMAL LOW (ref 36.0–46.0)
Hemoglobin: 9.2 g/dL — ABNORMAL LOW (ref 12.0–15.0)
MCH: 28.3 pg (ref 26.0–34.0)
MCHC: 31.9 g/dL (ref 30.0–36.0)
MCV: 88.6 fL (ref 80.0–100.0)
Platelets: 99 10*3/uL — ABNORMAL LOW (ref 150–400)
RBC: 3.25 MIL/uL — ABNORMAL LOW (ref 3.87–5.11)
RDW: 16.3 % — ABNORMAL HIGH (ref 11.5–15.5)
WBC: 2.1 10*3/uL — ABNORMAL LOW (ref 4.0–10.5)
nRBC: 0 % (ref 0.0–0.2)

## 2023-09-17 MED ORDER — EPOETIN ALFA-EPBX 20000 UNIT/ML IJ SOLN
20000.0000 [IU] | Freq: Once | INTRAMUSCULAR | Status: AC
  Administered 2023-09-17: 20000 [IU] via SUBCUTANEOUS
  Filled 2023-09-17: qty 1

## 2023-09-17 MED ORDER — SODIUM CHLORIDE 0.9 % IV SOLN
Freq: Once | INTRAVENOUS | Status: DC
Start: 2023-09-17 — End: 2023-09-17

## 2023-09-17 NOTE — Patient Instructions (Signed)
[facility] - A DEPT OF [facility]  Discharge Instructions: Thank you for choosing [facility] to provide your oncology and hematology care.  If you have a lab appointment with the Cancer Center - please note that after April 8th, 2024, all labs will be drawn in the cancer center.  You do not have to check in or register with the main entrance as you have in the past but will complete your check-in in the cancer center.  Wear comfortable clothing and clothing appropriate for easy access to any Portacath or PICC line.   We strive to give you quality time with your provider. You may need to reschedule your appointment if you arrive late (15 or more minutes).  Arriving late affects you and other patients whose appointments are after yours.  Also, if you miss three or more appointments without notifying the office, you may be dismissed from the clinic at the provider's discretion.      For prescription refill requests, have your pharmacy contact our office and allow 72 hours for refills to be completed.    Today you received the following retacrit   To help prevent nausea and vomiting after your treatment, we encourage you to take your nausea medication as directed.  BELOW ARE SYMPTOMS THAT SHOULD BE REPORTED IMMEDIATELY: *FEVER GREATER THAN 100.4 F (38 C) OR HIGHER *CHILLS OR SWEATING *NAUSEA AND VOMITING THAT IS NOT CONTROLLED WITH YOUR NAUSEA MEDICATION *UNUSUAL SHORTNESS OF BREATH *UNUSUAL BRUISING OR BLEEDING *URINARY PROBLEMS (pain or burning when urinating, or frequent urination) *BOWEL PROBLEMS (unusual diarrhea, constipation, pain near the anus) TENDERNESS IN MOUTH AND THROAT WITH OR WITHOUT PRESENCE OF ULCERS (sore throat, sores in mouth, or a toothache) UNUSUAL RASH, SWELLING OR PAIN  UNUSUAL VAGINAL DISCHARGE OR ITCHING   Items with * indicate a potential emergency and should be followed up as soon as possible or go to the Emergency  Department if any problems should occur.  Please show the CHEMOTHERAPY ALERT CARD or IMMUNOTHERAPY ALERT CARD at check-in to the Emergency Department and triage nurse.  Should you have questions after your visit or need to cancel or reschedule your appointment, please contact [facility] [place] - A DEPT OF [facility] [phone]  and follow the prompts.  Office hours are 8:00 a.m. to 4:30 p.m. Monday - Friday. Please note that voicemails left after 4:00 p.m. may not be returned until the following business day.  We are closed weekends and major holidays. You have access to a nurse at all times for urgent questions. Please call the main number to the clinic [phone] and follow the prompts.  For any non-urgent questions, you may also contact your provider using MyChart. We now offer e-Visits for anyone 64 and older to request care online for non-urgent symptoms. For details visit [email or web address].   Also download the MyChart app! Go to the app store, search "MyChart", open the app, select [place], and log in with your MyChart username and password.

## 2023-09-17 NOTE — Progress Notes (Signed)
Retacrit injection given per orders. Patient tolerated it well without problems. Vitals stable and discharged home from clinic via wheelchair. Follow up as scheduled.

## 2023-09-29 ENCOUNTER — Other Ambulatory Visit: Payer: Self-pay | Admitting: Family Medicine

## 2023-09-30 ENCOUNTER — Other Ambulatory Visit: Payer: Self-pay

## 2023-09-30 ENCOUNTER — Telehealth: Payer: Self-pay

## 2023-09-30 DIAGNOSIS — D500 Iron deficiency anemia secondary to blood loss (chronic): Secondary | ICD-10-CM

## 2023-09-30 DIAGNOSIS — D696 Thrombocytopenia, unspecified: Secondary | ICD-10-CM

## 2023-09-30 MED ORDER — TORSEMIDE 20 MG PO TABS: 20.0000 mg | ORAL_TABLET | Freq: Two times a day (BID) | ORAL | 0 refills | Status: DC

## 2023-09-30 NOTE — Telephone Encounter (Signed)
90d are not appropriate for PRN meds.

## 2023-09-30 NOTE — Telephone Encounter (Signed)
Spoke with pt , request has been sent to provider

## 2023-09-30 NOTE — Telephone Encounter (Signed)
Copied from CRM [id number]. Topic: Clinical - Medication Question >> Sep 30, 2023  9:52 AM Maxwell Marion wrote: Reason for CRM: Patient wants a call back from Dr. Nadine Counts nurse to discuss medications, call back number is [phone]

## 2023-10-01 ENCOUNTER — Inpatient Hospital Stay: Payer: PPO

## 2023-10-01 ENCOUNTER — Inpatient Hospital Stay: Payer: PPO | Attending: Hematology

## 2023-10-01 VITALS — BP 178/57 | HR 60 | Temp 97.0°F | Resp 21

## 2023-10-01 DIAGNOSIS — D696 Thrombocytopenia, unspecified: Secondary | ICD-10-CM

## 2023-10-01 DIAGNOSIS — I129 Hypertensive chronic kidney disease with stage 1 through stage 4 chronic kidney disease, or unspecified chronic kidney disease: Secondary | ICD-10-CM | POA: Insufficient documentation

## 2023-10-01 DIAGNOSIS — D631 Anemia in chronic kidney disease: Secondary | ICD-10-CM | POA: Insufficient documentation

## 2023-10-01 DIAGNOSIS — N1832 Chronic kidney disease, stage 3b: Secondary | ICD-10-CM | POA: Diagnosis not present

## 2023-10-01 DIAGNOSIS — M8580 Other specified disorders of bone density and structure, unspecified site: Secondary | ICD-10-CM

## 2023-10-01 DIAGNOSIS — Z79899 Other long term (current) drug therapy: Secondary | ICD-10-CM | POA: Diagnosis not present

## 2023-10-01 DIAGNOSIS — D500 Iron deficiency anemia secondary to blood loss (chronic): Secondary | ICD-10-CM

## 2023-10-01 DIAGNOSIS — D649 Anemia, unspecified: Secondary | ICD-10-CM

## 2023-10-01 LAB — CBC WITH DIFFERENTIAL/PLATELET
Abs Immature Granulocytes: 0.02 10*3/uL (ref 0.00–0.07)
Basophils Absolute: 0 10*3/uL (ref 0.0–0.1)
Basophils Relative: 0 %
Eosinophils Absolute: 0 10*3/uL (ref 0.0–0.5)
Eosinophils Relative: 0 %
HCT: 29.6 % — ABNORMAL LOW (ref 36.0–46.0)
Hemoglobin: 9.4 g/dL — ABNORMAL LOW (ref 12.0–15.0)
Immature Granulocytes: 0 %
Lymphocytes Relative: 10 %
Lymphs Abs: 0.5 10*3/uL — ABNORMAL LOW (ref 0.7–4.0)
MCH: 28.2 pg (ref 26.0–34.0)
MCHC: 31.8 g/dL (ref 30.0–36.0)
MCV: 88.9 fL (ref 80.0–100.0)
Monocytes Absolute: 0.5 10*3/uL (ref 0.1–1.0)
Monocytes Relative: 11 %
Neutro Abs: 3.7 10*3/uL (ref 1.7–7.7)
Neutrophils Relative %: 79 %
RBC: 3.33 MIL/uL — ABNORMAL LOW (ref 3.87–5.11)
RDW: 16.3 % — ABNORMAL HIGH (ref 11.5–15.5)
Smear Review: DECREASED
WBC: 4.7 10*3/uL (ref 4.0–10.5)
nRBC: 0 % (ref 0.0–0.2)

## 2023-10-01 LAB — SAMPLE TO BLOOD BANK

## 2023-10-01 MED ORDER — EPOETIN ALFA-EPBX 20000 UNIT/ML IJ SOLN
20000.0000 [IU] | Freq: Once | INTRAMUSCULAR | Status: AC
  Administered 2023-10-01: 20000 [IU] via SUBCUTANEOUS
  Filled 2023-10-01: qty 1

## 2023-10-01 NOTE — Patient Instructions (Signed)
[facility] [place] - A DEPT OF [facility]  Discharge Instructions: Thank you for choosing [facility] to provide your oncology and hematology care.  If you have a lab appointment with the Cancer Center - please note that after April 8th, 2024, all labs will be drawn in the cancer center.  You do not have to check in or register with the main entrance as you have in the past but will complete your check-in in the cancer center.  Wear comfortable clothing and clothing appropriate for easy access to any Portacath or PICC line.   We strive to give you quality time with your provider. You may need to reschedule your appointment if you arrive late (15 or more minutes).  Arriving late affects you and other patients whose appointments are after yours.  Also, if you miss three or more appointments without notifying the office, you may be dismissed from the clinic at the provider's discretion.      For prescription refill requests, have your pharmacy contact our office and allow 72 hours for refills to be completed.    Today you received the following :  Retacrit.  Epoetin Alfa Injection What is this medication? EPOETIN ALFA (e POE e tin AL fa) treats low levels of red blood cells (anemia) caused by kidney disease, chemotherapy, or HIV medications. It can also be used in people who are at risk for blood loss during surgery. It works by Systems analyst make more red blood cells, which reduces the need for blood transfusions. This medicine may be used for other purposes; ask your health care provider or pharmacist if you have questions. COMMON BRAND NAME(S): Epogen, Procrit, Retacrit What should I tell my care team before I take this medication? They need to know if you have any of these conditions: Blood clots Cancer Heart disease High blood pressure On dialysis Seizures Stroke An unusual or allergic reaction to epoetin alfa, albumin, benzyl alcohol, other  medications, foods, dyes, or preservatives Pregnant or trying to get pregnant Breast-feeding How should I use this medication? This medication is injected into a vein or under the skin. It is usually given by your care team in a hospital or clinic setting. It may also be given at home. If you get this medication at home, you will be taught how to prepare and give it. Use exactly as directed. Take it as directed on the prescription label at the same time every day. Keep taking it unless your care team tells you to stop. It is important that you put your used needles and syringes in a special sharps container. Do not put them in a trash can. If you do not have a sharps container, call your pharmacist or care team to get one. A special MedGuide will be given to you by the pharmacist with each prescription and refill. Be sure to read this information carefully each time. Talk to your care team about the use of this medication in children. While this medication may be used in children as young as 1 month of age for selected conditions, precautions do apply. Overdosage: If you think you have taken too much of this medicine contact a poison control center or emergency room at once. NOTE: This medicine is only for you. Do not share this medicine with others. What if I miss a dose? If you miss a dose, take it as soon as you can. If it is almost time for your  next dose, take only that dose. Do not take double or extra doses. What may interact with this medication? Darbepoetin alfa Methoxy polyethylene glycol-epoetin beta This list may not describe all possible interactions. Give your health care provider a list of all the medicines, herbs, non-prescription drugs, or dietary supplements you use. Also tell them if you smoke, drink alcohol, or use illegal drugs. Some items may interact with your medicine. What should I watch for while using this medication? Visit your care team for regular checks on your  progress. Check your blood pressure as directed. Know what your blood pressure should be and when to contact your care team. Your condition will be monitored carefully while you are receiving this medication. You may need blood work while taking this medication. What side effects may I notice from receiving this medication? Side effects that you should report to your care team as soon as possible: Allergic reactions--skin rash, itching, hives, swelling of the face, lips, tongue, or throat Blood clot--pain, swelling, or warmth in the leg, shortness of breath, chest pain Heart attack--pain or tightness in the chest, shoulders, arms, or jaw, nausea, shortness of breath, cold or clammy skin, feeling faint or lightheaded Increase in blood pressure Rash, fever, and swollen lymph nodes Redness, blistering, peeling, or loosening of the skin, including inside the mouth Seizures Stroke--sudden numbness or weakness of the face, arm, or leg, trouble speaking, confusion, trouble walking, loss of balance or coordination, dizziness, severe headache, change in vision Side effects that usually do not require medical attention (report to your care team if they continue or are bothersome): Bone, joint, or muscle pain Cough Headache Nausea Pain, redness, or irritation at injection site This list may not describe all possible side effects. Call your doctor for medical advice about side effects. You may report side effects to FDA at 1-800-FDA-1088. Where should I keep my medication? Keep out of the reach of children and pets. Store in a refrigerator. Do not freeze. Do not shake. Protect from light. Keep this medication in the original container until you are ready to take it. See product for storage information. Get rid of any unused medication after the expiration date. To get rid of medications that are no longer needed or have expired: Take the medication to a medication take-back program. Check with your  pharmacy or law enforcement to find a location. If you cannot return the medication, ask your pharmacist or care team how to get rid of the medication safely. NOTE: This sheet is a summary. It may not cover all possible information. If you have questions about this medicine, talk to your doctor, pharmacist, or health care provider.  2024 Elsevier/Gold Standard (2021-12-06 00:00:00)    To help prevent nausea and vomiting after your treatment, we encourage you to take your nausea medication as directed.  BELOW ARE SYMPTOMS THAT SHOULD BE REPORTED IMMEDIATELY: *FEVER GREATER THAN 100.4 F (38 C) OR HIGHER *CHILLS OR SWEATING *NAUSEA AND VOMITING THAT IS NOT CONTROLLED WITH YOUR NAUSEA MEDICATION *UNUSUAL SHORTNESS OF BREATH *UNUSUAL BRUISING OR BLEEDING *URINARY PROBLEMS (pain or burning when urinating, or frequent urination) *BOWEL PROBLEMS (unusual diarrhea, constipation, pain near the anus) TENDERNESS IN MOUTH AND THROAT WITH OR WITHOUT PRESENCE OF ULCERS (sore throat, sores in mouth, or a toothache) UNUSUAL RASH, SWELLING OR PAIN  UNUSUAL VAGINAL DISCHARGE OR ITCHING   Items with * indicate a potential emergency and should be followed up as soon as possible or go to the Emergency Department if any problems should  occur.  Please show the CHEMOTHERAPY ALERT CARD or IMMUNOTHERAPY ALERT CARD at check-in to the Emergency Department and triage nurse.  Should you have questions after your visit or need to cancel or reschedule your appointment, please contact [facility] [place] - A DEPT OF [facility] [phone]  and follow the prompts.  Office hours are 8:00 a.m. to 4:30 p.m. Monday - Friday. Please note that voicemails left after 4:00 p.m. may not be returned until the following business day.  We are closed weekends and major holidays. You have access to a nurse at all times for urgent questions. Please call the main number to the clinic [phone] and follow the  prompts.  For any non-urgent questions, you may also contact your provider using MyChart. We now offer e-Visits for anyone 73 and older to request care online for non-urgent symptoms. For details visit [email or web address].   Also download the MyChart app! Go to the app store, search "MyChart", open the app, select [place], and log in with your MyChart username and password.

## 2023-10-01 NOTE — Progress Notes (Signed)
Hemoglobin today is 9.4.  We will proceed with Retacrit per provider orders.  Patient tolerated injection with no complaints voiced.  Site clean and dry with no bruising or swelling noted.  No complaints of pain.  Discharged with vital signs stable and no signs or symptoms of distress noted.

## 2023-10-02 ENCOUNTER — Ambulatory Visit: Payer: PPO | Admitting: Nurse Practitioner

## 2023-10-02 ENCOUNTER — Ambulatory Visit (INDEPENDENT_AMBULATORY_CARE_PROVIDER_SITE_OTHER): Payer: PPO

## 2023-10-02 ENCOUNTER — Ambulatory Visit: Payer: Self-pay | Admitting: Family Medicine

## 2023-10-02 ENCOUNTER — Encounter: Payer: Self-pay | Admitting: Nurse Practitioner

## 2023-10-02 VITALS — BP 164/56 | HR 75 | Temp 98.7°F | Ht 64.0 in | Wt 110.8 lb

## 2023-10-02 DIAGNOSIS — J189 Pneumonia, unspecified organism: Secondary | ICD-10-CM | POA: Diagnosis not present

## 2023-10-02 DIAGNOSIS — I700 Atherosclerosis of aorta: Secondary | ICD-10-CM | POA: Diagnosis not present

## 2023-10-02 DIAGNOSIS — R918 Other nonspecific abnormal finding of lung field: Secondary | ICD-10-CM | POA: Diagnosis not present

## 2023-10-02 DIAGNOSIS — R5081 Fever presenting with conditions classified elsewhere: Secondary | ICD-10-CM

## 2023-10-02 DIAGNOSIS — R0602 Shortness of breath: Secondary | ICD-10-CM | POA: Diagnosis not present

## 2023-10-02 DIAGNOSIS — J449 Chronic obstructive pulmonary disease, unspecified: Secondary | ICD-10-CM | POA: Diagnosis not present

## 2023-10-02 MED ORDER — AZITHROMYCIN 250 MG PO TABS: ORAL_TABLET | ORAL | 0 refills | Status: DC

## 2023-10-02 NOTE — Telephone Encounter (Signed)
Chief Complaint: Right sided pain with inspiration, low grade fever Symptoms: right side pain with inspiration, low grade fever Frequency: 2 days Pertinent Negatives: Patient denies rash, difficulty breathing Disposition: [] ED /[] Urgent Care (no appt availability in office) / [x] Appointment(In office/virtual)/ []  [facility]/ [] Home Care/ [] Refused Recommended Disposition /[] [facility]/ []  Follow-up with PCP Additional Notes: Patient called in stating she is recently recovering from double pneumonia back in October and is experiencing pain in her right ribs when she takes a deep breath in, and patient also has a low grade fever of 100.0. Patient is requesting to be evaluated today. Patient appt made for today.    Copied from CRM [id number]. Topic: Clinical - Red Word Triage >> Oct 02, 2023 11:38 AM Higinio Roger wrote: Red Word that prompted transfer to Nurse Triage: Had Pneumonia recently (05/2023)  Patient is currently running a fever of 100, has headache, pain in right rib (front and back), has been taking Tylenol since 2/13 and would like an xray. Patient also has cough. Patient is requesting a callback from Dr. Nadine Counts nurse Reason for Disposition  Fever present > 3 days (72 hours)  Answer Assessment - Initial Assessment Questions 1. TEMPERATURE: "What is the most recent temperature?"  "How was it measured?"      100 - last night 2. ONSET: "When did the fever start?"      Last night  3. CHILLS: "Do you have chills?" If yes: "How bad are they?"  (e.g., none, mild, moderate, severe)   - NONE: no chills   - MILD: feeling cold   - MODERATE: feeling very cold, some shivering (feels better under a thick blanket)   - SEVERE: feeling extremely cold with shaking chills (general body shaking, rigors; even under a thick blanket)      None 4. OTHER SYMPTOMS: "Do you have any other symptoms besides the fever?"  (e.g., abdomen pain, cough, diarrhea, earache, headache, sore  throat, urination pain)     Headache, pain in right side with deep breath 5. CAUSE: If there are no symptoms, ask: "What do you think is causing the fever?"      unsure 6. CONTACTS: "Does anyone else in the family have an infection?"     No 7. TREATMENT: "What have you done so far to treat this fever?" (e.g., medications)     Tylenol 8. IMMUNOCOMPROMISE: "Do you have of the following: diabetes, HIV positive, splenectomy, cancer chemotherapy, chronic steroid treatment, transplant patient, etc."     No  Protocols used: Fever-A-AH

## 2023-10-02 NOTE — Progress Notes (Signed)
Subjective:    Patient ID: [name], female    DOB: 06-27-1934, 89 y.o.   MRN: [id number]   Chief Complaint: fever   HPI  Patient come sin c/o fever that started yesterday.100 degrees with ad headache.  Now has right flank pain when she takes a deep breathe. Rates pain 4/10. Patient Active Problem List   Diagnosis Date Noted   Protein-calorie malnutrition, moderate (HCC) 05/23/2023   Leukocytosis 05/22/2023   Hypoalbuminemia due to protein-calorie malnutrition (HCC) 05/22/2023   Osteopenia due to cancer therapy 10/07/2022   Epigastric burning sensation 09/18/2022   Heart failure due to valvular disease (HCC) 09/07/2022   GERD without esophagitis 06/17/2022   Anemia due to chronic blood loss 10/01/2021   Duodenal ulcer 10/01/2021   Early satiety    Anemia of chronic disease 08/31/2021   UGI bleed 08/31/2021   Failure to thrive in adult 08/31/2021   Mitral valve insufficiency 08/20/2021   History of right breast cancer    Chronic respiratory failure with hypoxia (HCC) 08/01/2021   Non-intractable vomiting 04/08/2021   Edema 02/22/2021   Constipation 01/23/2021   Osteoarthritis of knee 12/13/2020   History of arthroscopy of knee 12/13/2020   Pain in joint of left knee 12/13/2020   Atrial tachycardia (HCC) 07/26/2019   Aortic stenosis Mod to Severe 12/01/2018   Breast cancer, right (HCC) 04/08/2018   Malignant neoplasm of lower-outer quadrant of right breast of female, estrogen receptor positive (HCC) 02/25/2018   Tear of lateral meniscus of knee 10/27/2017   Vertigo 09/09/2016   Allergic rhinitis 03/17/2016   Renal vascular disease 10/31/2015   Nodule of right lung 10/31/2015   Chronic obstructive asthma (HCC) 10/31/2015   Essential hypertension, malignant    Hypothyroidism 10/15/2015   Pancytopenia (HCC) 10/15/2015   Hyponatremia 10/14/2015   Thrombocytopenia (HCC) 06/16/2015   Right renal artery stenosis (HCC) 05/09/2015   Chronic diastolic heart failure  (HCC) 78/29/5621   CKD (chronic kidney disease) stage 3, GFR 30-59 ml/min (HCC) 04/02/2014   Paroxysmal atrial fibrillation (HCC) 01/25/2014   Normocytic anemia 07/19/2013   Bilateral lower extremity edema 06/30/2013   Varicose veins of bilateral lower extremities with other complications 05/03/2013   ABNORMAL STRESS ELECTROCARDIOGRAM 01/23/2010   Mixed hyperlipidemia 12/18/2009   Essential hypertension 12/18/2009   Arthropathy 12/18/2009       Review of Systems  HENT:  Negative for sinus pain.   Respiratory:  Positive for shortness of breath (but is on oxygen at home 24/7).        Objective:   Physical Exam Constitutional:      Appearance: Normal appearance.  HENT:     Right Ear: Tympanic membrane normal.     Left Ear: Tympanic membrane normal.     Nose: Congestion present. No rhinorrhea.     Mouth/Throat:     Pharynx: No oropharyngeal exudate or posterior oropharyngeal erythema.  Cardiovascular:     Rate and Rhythm: Normal rate and regular rhythm.  Pulmonary:     Effort: Pulmonary effort is normal.     Comments: Decreased breath sounds right lower  lobe Skin:    General: Skin is warm.  Neurological:     General: No focal deficit present.     Mental Status: She is alert and oriented to person, place, and time.  Psychiatric:        Mood and Affect: Mood normal.        Behavior: Behavior normal.     BP (!) 164/56  Pulse 75   Temp 98.7 F (37.1 C)   Ht 5\' 4"  (1.626 m)   Wt 110 lb 12.8 oz (50.3 kg)   SpO2 98% Comment: 2L O2  BMI 19.02 kg/m   Chest xray- right lower lobe infiltrate      Assessment & Plan:   [name] in today with chief complaint of Fever, Headache, and Flank Pain (Lung area)   1. Fever in other diseases (Primary) - DG Chest 2 View  2. Community acquired pneumonia of right lower lobe of lung 1. Take meds as prescribed 2. Use a cool mist humidifier especially during the winter months and when heat has been humid. 3. Use saline  nose sprays frequently 4. Saline irrigations of the nose can be very helpful if done frequently.  * 4X daily for 1 week*  * Use of a nettie pot can be helpful with this. Follow directions with this* 5. Drink plenty of fluids 6. Keep thermostat turn down low 7.For any cough or congestion- mucinex 8. For fever or aces or pains- take tylenol or ibuprofen appropriate for age and weight.  * for fevers greater than 101 orally you may alternate ibuprofen and tylenol every  3 hours.   Meds ordered this encounter  Medications   azithromycin (ZITHROMAX Z-PAK) 250 MG tablet    Sig: As directed    Dispense:  6 tablet    Refill:  0    Supervising Provider:   Arville Care A [[id number]]       The above assessment and management plan was discussed with the patient. The patient verbalized understanding of and has agreed to the management plan. Patient is aware to call the clinic if symptoms persist or worsen. Patient is aware when to return to the clinic for a follow-up visit. Patient educated on when it is appropriate to go to the emergency department.   Mary-Margaret Daphine Deutscher, FNP

## 2023-10-02 NOTE — Patient Instructions (Signed)
Community-Acquired Pneumonia, Adult Pneumonia is a lung infection that causes inflammation and the buildup of mucus and fluids in the lungs. This may cause coughing and difficulty breathing. Community-acquired pneumonia is pneumonia that develops in people who are not, and have not recently been, in a hospital or other health care facility. Usually, pneumonia develops as a result of an illness that is caused by a virus, such as the common cold and the flu (influenza). It can also be caused by bacteria or fungi. While the common cold and influenza can pass from person to person (are contagious), pneumonia itself is not considered contagious. What are the causes? This condition may be caused by: Viruses. Bacteria. Fungi. What increases the risk? The following factors may make you more likely to develop this condition: Being over age 17 or having certain medical conditions, such as: A long-term (chronic) disease, such as: chronic obstructive pulmonary disease (COPD), asthma, heart failure, diabetes, or kidney disease. A condition that increases the risk of breathing in (aspirating) mucus and other fluids from your mouth and nose. A weakened body defense system (immune system). Having had your spleen removed (splenectomy). The spleen is the organ that helps fight germs and infections. Not cleaning your teeth and gums well (poor dental hygiene). Using tobacco products. Traveling to places where germs that cause pneumonia are present or being near certain animals or animal habitats that could have germs that cause pneumonia. What are the signs or symptoms? Symptoms of this condition include: A dry cough or a wet (productive) cough. A fever, sweating, or chills. Chest pain, especially when breathing deeply or coughing. Fast breathing, difficulty breathing, or shortness of breath. Tiredness (fatigue) and muscle aches. How is this diagnosed? This condition may be diagnosed based on your medical  history or a physical exam. You may also have tests, including: Imaging, such as a chest X-ray or lung ultrasound. Tests of: The level of oxygen and other gases in your blood. Mucus from your lungs (sputum). Fluid around your lungs (pleural fluid). Your urine. How is this treated? Treatment for this condition depends on many factors, such as the cause of your pneumonia, your medicines, and other medical conditions that you have. For most adults, pneumonia may be treated at home. In some cases, treatment must happen in a hospital and may include: Medicines that are given by mouth (orally) or through an IV, including: Antibiotic medicines, if bacteria caused the pneumonia. Medicines that kill viruses (antiviral medicines), if a virus caused the pneumonia. Oxygen therapy. Severe pneumonia, although rare, may require the following treatments: Mechanical ventilation.This procedure uses a machine to help you breathe if you cannot breathe well on your own or maintain a safe level of blood oxygen. Thoracentesis. This procedure removes any buildup of pleural fluid to help with breathing. Follow these instructions at home:  Medicines Take over-the-counter and prescription medicines only as told by your health care provider. Take cough medicine only if you have trouble sleeping. Cough medicine can prevent your body from removing mucus from your lungs. If you were prescribed antibiotics, take them as told by your health care provider. Do not stop taking the antibiotic even if you start to feel better. Lifestyle     Do not drink alcohol. Do not use any products that contain nicotine or tobacco. These products include cigarettes, chewing tobacco, and vaping devices, such as e-cigarettes. If you need help quitting, ask your health care provider. Eat a healthy diet. This includes plenty of vegetables, fruits, whole grains, low-fat  dairy products, and lean protein. General instructions Rest a lot and  get at least 8 hours of sleep each night. Sleep in a partly upright position at night. Place a few pillows under your head or sleep in a reclining chair. Return to your normal activities as told by your health care provider. Ask your health care provider what activities are safe for you. Drink enough fluid to keep your urine pale yellow. This helps to thin the mucus in your lungs. If your throat is sore, gargle with a mixture of salt and water 3-4 times a day or as needed. To make salt water, completely dissolve -1 tsp (3-6 g) of salt in 1 cup (237 mL) of warm water. Keep all follow-up visits. How is this prevented? You can lower your risk of developing community-acquired pneumonia by: Getting the pneumonia vaccine. There are different types and schedules of pneumonia vaccines. Ask your health care provider which option is best for you. Consider getting the pneumonia vaccine if: You are older than 88 years of age. You are 60-63 years of age and are receiving cancer treatment, have chronic lung disease, or have other medical conditions that affect your immune system. Ask your health care provider if this applies to you. Getting your influenza vaccine every year. Ask your health care provider which type of vaccine is best for you. Getting regular dental checkups. Washing your hands often with soap and water for at least 20 seconds. If soap and water are not available, use hand sanitizer. Contact a health care provider if: You have a fever. You have trouble sleeping because you cannot control your cough with cough medicine. Get help right away if: Your shortness of breath becomes worse. Your chest pain increases. Your sickness becomes worse, especially if you are an older adult or have a weak immune system. You cough up blood. These symptoms may be an emergency. Get help right away. Call 911. Do not wait to see if the symptoms will go away. Do not drive yourself to the  hospital. Summary Pneumonia is an infection of the lungs. Community-acquired pneumonia develops in people who have not been in the hospital. It can be caused by bacteria, viruses, or fungi. This condition may be treated with antibiotics or antiviral medicines. Severe pneumonia may require a hospital stay and treatment to help with breathing. This information is not intended to replace advice given to you by your health care provider. Make sure you discuss any questions you have with your health care provider. Document Revised: 04/30/2023 Document Reviewed: 10/02/2021 Elsevier Patient Education  2024 [employer].

## 2023-10-08 ENCOUNTER — Ambulatory Visit: Payer: Self-pay | Admitting: *Deleted

## 2023-10-08 NOTE — Patient Instructions (Signed)
Visit Information  Thank you for taking time to visit with me today. Please don't hesitate to contact me if I can be of assistance to you.   Following are the goals we discussed today:   Goals Addressed             This Visit's Progress    decrease re admission risks -RN CM coordination services   On track    Decrease re admission risks- 10/07/22 reports she continues to do well at home only with some shortness of breath with exertion, still uses her oxygen, and gets anemia injection weekly- No recent admissoin Patient pending home health services 07/13/23 patient reports services are pending rescheduling 10/08/23 goal met no further need for home health services  Interventions Today    Flowsheet Row Most Recent Value  Chronic Disease   Chronic disease during today's visit Other, Congestive Heart Failure (CHF)  [asthma/home medicines Pneumonia, Mobility,]  General Interventions   General Interventions Discussed/Reviewed General Interventions Reviewed, Durable Medical Equipment (DME), Doctor Visits  Doctor Visits Discussed/Reviewed Doctor Visits Reviewed, PCP  Durable Medical Equipment (DME) Oxygen  PCP/Specialist Visits Compliance with follow-up visit  Exercise Interventions   Exercise Discussed/Reviewed Exercise Reviewed, Physical Activity  [Confirms increase mobiilty but limited related to her oxygen )home & portable) States no further home health visits]  Physical Activity Discussed/Reviewed Physical Activity Reviewed  Education Interventions   Education Provided Provided Education  Provided Verbal Education On Exercise, Medication  [use of her inhaler Albuterol. Reports she ha not used it in a while]  Mental Health Interventions   Mental Health Discussed/Reviewed Mental Health Reviewed, Coping Strategies  Safety Interventions   Safety Discussed/Reviewed Safety Reviewed, Home Safety  Home Safety Assistive Devices              Our next appointment is by telephone on  12/10/23 at 1000  Please call the care guide team at [phone] if you need to cancel or reschedule your appointment.   If you are experiencing a Mental Health or Behavioral Health Crisis or need someone to talk to, please call the Suicide and Crisis Lifeline: 988 call the [place] National Suicide Prevention Lifeline: 316-643-6319 or TTY: 479-263-5959 TTY (402)532-8167) to talk to a trained counselor call 1-800-273-TALK (toll free, 24 hour hotline) call the [facility]: 865-395-8241 call 911   Patient verbalizes understanding of instructions and care plan provided today and agrees to view in MyChart. Active MyChart status and patient understanding of how to access instructions and care plan via MyChart confirmed with patient.     The patient has been provided with contact information for the care management team and has been advised to call with any health related questions or concerns.   Kenzey Birkland L. Noelle Penner, RN, BSN, CCM [place]  [facility], [facility] RN Care Manager Direct Dial: [phone]  Fax: [phone] Mailing Address: [address]  [place] [address] Website: [email or web address]

## 2023-10-08 NOTE — Patient Outreach (Signed)
  Care Coordination   Follow Up Visit Note   10/08/2023 Name: [name] MRN: [id number] DOB: Jan 20, 1934  [name] is a 89 y.o. year old female who sees Janet Ip, DO for primary care. I spoke with  [name] by phone today.  What matters to the patients health and wellness today?  asthma/home medicines Pneumonia, Mobility,    Goals Addressed             This Visit's Progress    decrease re admission risks -RN CM coordination services   On track    Decrease re admission risks- 10/07/22 reports she continues to do well at home only with some shortness of breath with exertion, still uses her oxygen, and gets anemia injection weekly- No recent admissoin Patient pending home health services 07/13/23 patient reports services are pending rescheduling 10/08/23 goal met no further need for home health services  Interventions Today    Flowsheet Row Most Recent Value  Chronic Disease   Chronic disease during today's visit Other, Congestive Heart Failure (CHF)  [asthma/home medicines Pneumonia, Mobility,]  General Interventions   General Interventions Discussed/Reviewed General Interventions Reviewed, Durable Medical Equipment (DME), Doctor Visits  Doctor Visits Discussed/Reviewed Doctor Visits Reviewed, PCP  Durable Medical Equipment (DME) Oxygen  PCP/Specialist Visits Compliance with follow-up visit  Exercise Interventions   Exercise Discussed/Reviewed Exercise Reviewed, Physical Activity  [Confirms increase mobiilty but limited related to her oxygen )home & portable) States no further home health visits]  Physical Activity Discussed/Reviewed Physical Activity Reviewed  Education Interventions   Education Provided Provided Education  Provided Verbal Education On Exercise, Medication  [use of her inhaler Albuterol. Reports she ha not used it in a while]  Mental Health Interventions   Mental Health Discussed/Reviewed Mental Health Reviewed, Coping Strategies  Safety  Interventions   Safety Discussed/Reviewed Safety Reviewed, Home Safety  Home Safety Assistive Devices              SDOH assessments and interventions completed:  No     Care Coordination Interventions:  Yes, provided   Follow up plan: Follow up call scheduled for 12/10/23    Encounter Outcome:  Patient Visit Completed   Janet Bradford L. Noelle Penner, RN, BSN, CCM [place]  [facility], [facility] RN Care Manager Direct Dial: [phone]  Fax: [phone] Mailing Address: [address]  [place] [address] Website: [email or web address]

## 2023-10-13 DIAGNOSIS — C44729 Squamous cell carcinoma of skin of left lower limb, including hip: Secondary | ICD-10-CM | POA: Diagnosis not present

## 2023-10-13 DIAGNOSIS — D485 Neoplasm of uncertain behavior of skin: Secondary | ICD-10-CM | POA: Diagnosis not present

## 2023-10-14 DIAGNOSIS — J449 Chronic obstructive pulmonary disease, unspecified: Secondary | ICD-10-CM | POA: Diagnosis not present

## 2023-10-15 ENCOUNTER — Inpatient Hospital Stay: Payer: PPO

## 2023-10-15 VITALS — BP 109/53 | HR 44 | Temp 97.4°F | Resp 18

## 2023-10-15 DIAGNOSIS — D649 Anemia, unspecified: Secondary | ICD-10-CM

## 2023-10-15 DIAGNOSIS — I129 Hypertensive chronic kidney disease with stage 1 through stage 4 chronic kidney disease, or unspecified chronic kidney disease: Secondary | ICD-10-CM | POA: Diagnosis not present

## 2023-10-15 DIAGNOSIS — M8580 Other specified disorders of bone density and structure, unspecified site: Secondary | ICD-10-CM

## 2023-10-15 LAB — CBC
HCT: 28.9 % — ABNORMAL LOW (ref 36.0–46.0)
Hemoglobin: 9.1 g/dL — ABNORMAL LOW (ref 12.0–15.0)
MCH: 27.7 pg (ref 26.0–34.0)
MCHC: 31.5 g/dL (ref 30.0–36.0)
MCV: 87.8 fL (ref 80.0–100.0)
Platelets: 105 10*3/uL — ABNORMAL LOW (ref 150–400)
RBC: 3.29 MIL/uL — ABNORMAL LOW (ref 3.87–5.11)
RDW: 15.7 % — ABNORMAL HIGH (ref 11.5–15.5)
WBC: 2.4 10*3/uL — ABNORMAL LOW (ref 4.0–10.5)
nRBC: 0 % (ref 0.0–0.2)

## 2023-10-15 LAB — SAMPLE TO BLOOD BANK

## 2023-10-15 MED ORDER — EPOETIN ALFA-EPBX 20000 UNIT/ML IJ SOLN
20000.0000 [IU] | Freq: Once | INTRAMUSCULAR | Status: AC
  Administered 2023-10-15: 20000 [IU] via SUBCUTANEOUS
  Filled 2023-10-15: qty 1

## 2023-10-15 NOTE — Progress Notes (Signed)
 Hemoglobin today is 9.1.  We will proceed with Retacrit injection per provider orders.   Patient tolerated injection with no complaints voiced.  Site clean and dry with no bruising or swelling noted.  No complaints of pain.  Discharged with vital signs stable and no signs or symptoms of distress noted.

## 2023-10-15 NOTE — Patient Instructions (Signed)
 [facility] [place] - A DEPT OF [facility]  Discharge Instructions: Thank you for choosing [facility] to provide your oncology and hematology care.  If you have a lab appointment with the Cancer Center - please note that after April 8th, 2024, all labs will be drawn in the cancer center.  You do not have to check in or register with the main entrance as you have in the past but will complete your check-in in the cancer center.  Wear comfortable clothing and clothing appropriate for easy access to any Portacath or PICC line.   We strive to give you quality time with your provider. You may need to reschedule your appointment if you arrive late (15 or more minutes).  Arriving late affects you and other patients whose appointments are after yours.  Also, if you miss three or more appointments without notifying the office, you may be dismissed from the clinic at the provider's discretion.      For prescription refill requests, have your pharmacy contact our office and allow 72 hours for refills to be completed.    Today you received the following :  Retacrit.  Epoetin Alfa Injection What is this medication? EPOETIN ALFA (e POE e tin AL fa) treats low levels of red blood cells (anemia) caused by kidney disease, chemotherapy, or HIV medications. It can also be used in people who are at risk for blood loss during surgery. It works by Systems analyst make more red blood cells, which reduces the need for blood transfusions. This medicine may be used for other purposes; ask your health care provider or pharmacist if you have questions. COMMON BRAND NAME(S): Epogen, Procrit, Retacrit What should I tell my care team before I take this medication? They need to know if you have any of these conditions: Blood clots Cancer Heart disease High blood pressure On dialysis Seizures Stroke An unusual or allergic reaction to epoetin alfa, albumin, benzyl alcohol, other  medications, foods, dyes, or preservatives Pregnant or trying to get pregnant Breast-feeding How should I use this medication? This medication is injected into a vein or under the skin. It is usually given by your care team in a hospital or clinic setting. It may also be given at home. If you get this medication at home, you will be taught how to prepare and give it. Use exactly as directed. Take it as directed on the prescription label at the same time every day. Keep taking it unless your care team tells you to stop. It is important that you put your used needles and syringes in a special sharps container. Do not put them in a trash can. If you do not have a sharps container, call your pharmacist or care team to get one. A special MedGuide will be given to you by the pharmacist with each prescription and refill. Be sure to read this information carefully each time. Talk to your care team about the use of this medication in children. While this medication may be used in children as young as 1 month of age for selected conditions, precautions do apply. Overdosage: If you think you have taken too much of this medicine contact a poison control center or emergency room at once. NOTE: This medicine is only for you. Do not share this medicine with others. What if I miss a dose? If you miss a dose, take it as soon as you can. If it is almost time for your  next dose, take only that dose. Do not take double or extra doses. What may interact with this medication? Darbepoetin alfa Methoxy polyethylene glycol-epoetin beta This list may not describe all possible interactions. Give your health care provider a list of all the medicines, herbs, non-prescription drugs, or dietary supplements you use. Also tell them if you smoke, drink alcohol, or use illegal drugs. Some items may interact with your medicine. What should I watch for while using this medication? Visit your care team for regular checks on your  progress. Check your blood pressure as directed. Know what your blood pressure should be and when to contact your care team. Your condition will be monitored carefully while you are receiving this medication. You may need blood work while taking this medication. What side effects may I notice from receiving this medication? Side effects that you should report to your care team as soon as possible: Allergic reactions--skin rash, itching, hives, swelling of the face, lips, tongue, or throat Blood clot--pain, swelling, or warmth in the leg, shortness of breath, chest pain Heart attack--pain or tightness in the chest, shoulders, arms, or jaw, nausea, shortness of breath, cold or clammy skin, feeling faint or lightheaded Increase in blood pressure Rash, fever, and swollen lymph nodes Redness, blistering, peeling, or loosening of the skin, including inside the mouth Seizures Stroke--sudden numbness or weakness of the face, arm, or leg, trouble speaking, confusion, trouble walking, loss of balance or coordination, dizziness, severe headache, change in vision Side effects that usually do not require medical attention (report to your care team if they continue or are bothersome): Bone, joint, or muscle pain Cough Headache Nausea Pain, redness, or irritation at injection site This list may not describe all possible side effects. Call your doctor for medical advice about side effects. You may report side effects to FDA at 1-800-FDA-1088. Where should I keep my medication? Keep out of the reach of children and pets. Store in a refrigerator. Do not freeze. Do not shake. Protect from light. Keep this medication in the original container until you are ready to take it. See product for storage information. Get rid of any unused medication after the expiration date. To get rid of medications that are no longer needed or have expired: Take the medication to a medication take-back program. Check with your  pharmacy or law enforcement to find a location. If you cannot return the medication, ask your pharmacist or care team how to get rid of the medication safely. NOTE: This sheet is a summary. It may not cover all possible information. If you have questions about this medicine, talk to your doctor, pharmacist, or health care provider.  2024 Elsevier/Gold Standard (2021-12-06 00:00:00)    To help prevent nausea and vomiting after your treatment, we encourage you to take your nausea medication as directed.  BELOW ARE SYMPTOMS THAT SHOULD BE REPORTED IMMEDIATELY: *FEVER GREATER THAN 100.4 F (38 C) OR HIGHER *CHILLS OR SWEATING *NAUSEA AND VOMITING THAT IS NOT CONTROLLED WITH YOUR NAUSEA MEDICATION *UNUSUAL SHORTNESS OF BREATH *UNUSUAL BRUISING OR BLEEDING *URINARY PROBLEMS (pain or burning when urinating, or frequent urination) *BOWEL PROBLEMS (unusual diarrhea, constipation, pain near the anus) TENDERNESS IN MOUTH AND THROAT WITH OR WITHOUT PRESENCE OF ULCERS (sore throat, sores in mouth, or a toothache) UNUSUAL RASH, SWELLING OR PAIN  UNUSUAL VAGINAL DISCHARGE OR ITCHING   Items with * indicate a potential emergency and should be followed up as soon as possible or go to the Emergency Department if any problems should  occur.  Please show the CHEMOTHERAPY ALERT CARD or IMMUNOTHERAPY ALERT CARD at check-in to the Emergency Department and triage nurse.  Should you have questions after your visit or need to cancel or reschedule your appointment, please contact [facility] [place] - A DEPT OF [facility] [phone]  and follow the prompts.  Office hours are 8:00 a.m. to 4:30 p.m. Monday - Friday. Please note that voicemails left after 4:00 p.m. may not be returned until the following business day.  We are closed weekends and major holidays. You have access to a nurse at all times for urgent questions. Please call the main number to the clinic [phone] and follow the  prompts.  For any non-urgent questions, you may also contact your provider using MyChart. We now offer e-Visits for anyone 73 and older to request care online for non-urgent symptoms. For details visit [email or web address].   Also download the MyChart app! Go to the app store, search "MyChart", open the app, select [place], and log in with your MyChart username and password.

## 2023-10-19 ENCOUNTER — Telehealth: Payer: Self-pay | Admitting: Family Medicine

## 2023-10-19 NOTE — Telephone Encounter (Signed)
 Copied from CRM [id number]. Topic: Clinical - Lab/Test Results >> Oct 19, 2023  9:15 AM Tiffany B wrote: Reason for CRM: Patient received a noticed in the mail that Dr. Allena Katz from [facility] wanted her to have labs done.  Chart does not reflect orders and CAL confirmed if the lab orders are with [facility] patient can come into the practice.  Patient confirmed orders are with [facility]. Patient scheduled lab appointment for 10/20/2023 at 9:45am.

## 2023-10-20 ENCOUNTER — Other Ambulatory Visit

## 2023-10-20 DIAGNOSIS — N184 Chronic kidney disease, stage 4 (severe): Secondary | ICD-10-CM | POA: Diagnosis not present

## 2023-10-21 ENCOUNTER — Other Ambulatory Visit: Payer: PPO

## 2023-10-23 ENCOUNTER — Other Ambulatory Visit: Payer: Self-pay | Admitting: Family Medicine

## 2023-10-27 DIAGNOSIS — D649 Anemia, unspecified: Secondary | ICD-10-CM | POA: Diagnosis not present

## 2023-10-27 DIAGNOSIS — I129 Hypertensive chronic kidney disease with stage 1 through stage 4 chronic kidney disease, or unspecified chronic kidney disease: Secondary | ICD-10-CM | POA: Diagnosis not present

## 2023-10-27 DIAGNOSIS — N184 Chronic kidney disease, stage 4 (severe): Secondary | ICD-10-CM | POA: Diagnosis not present

## 2023-10-27 DIAGNOSIS — N2581 Secondary hyperparathyroidism of renal origin: Secondary | ICD-10-CM | POA: Diagnosis not present

## 2023-10-29 ENCOUNTER — Inpatient Hospital Stay: Payer: PPO | Attending: Hematology

## 2023-10-29 ENCOUNTER — Inpatient Hospital Stay: Payer: PPO

## 2023-10-29 VITALS — BP 164/57 | HR 57 | Temp 97.5°F | Resp 18

## 2023-10-29 DIAGNOSIS — M8580 Other specified disorders of bone density and structure, unspecified site: Secondary | ICD-10-CM

## 2023-10-29 DIAGNOSIS — D509 Iron deficiency anemia, unspecified: Secondary | ICD-10-CM | POA: Diagnosis not present

## 2023-10-29 DIAGNOSIS — I130 Hypertensive heart and chronic kidney disease with heart failure and stage 1 through stage 4 chronic kidney disease, or unspecified chronic kidney disease: Secondary | ICD-10-CM | POA: Diagnosis not present

## 2023-10-29 DIAGNOSIS — Z170 Estrogen receptor positive status [ER+]: Secondary | ICD-10-CM | POA: Insufficient documentation

## 2023-10-29 DIAGNOSIS — D61818 Other pancytopenia: Secondary | ICD-10-CM | POA: Insufficient documentation

## 2023-10-29 DIAGNOSIS — E538 Deficiency of other specified B group vitamins: Secondary | ICD-10-CM | POA: Insufficient documentation

## 2023-10-29 DIAGNOSIS — Z79899 Other long term (current) drug therapy: Secondary | ICD-10-CM | POA: Insufficient documentation

## 2023-10-29 DIAGNOSIS — N184 Chronic kidney disease, stage 4 (severe): Secondary | ICD-10-CM | POA: Insufficient documentation

## 2023-10-29 DIAGNOSIS — C50511 Malignant neoplasm of lower-outer quadrant of right female breast: Secondary | ICD-10-CM | POA: Insufficient documentation

## 2023-10-29 DIAGNOSIS — D631 Anemia in chronic kidney disease: Secondary | ICD-10-CM | POA: Insufficient documentation

## 2023-10-29 DIAGNOSIS — D649 Anemia, unspecified: Secondary | ICD-10-CM

## 2023-10-29 LAB — CBC
HCT: 28.1 % — ABNORMAL LOW (ref 36.0–46.0)
Hemoglobin: 8.8 g/dL — ABNORMAL LOW (ref 12.0–15.0)
MCH: 27.8 pg (ref 26.0–34.0)
MCHC: 31.3 g/dL (ref 30.0–36.0)
MCV: 88.9 fL (ref 80.0–100.0)
Platelets: 88 10*3/uL — ABNORMAL LOW (ref 150–400)
RBC: 3.16 MIL/uL — ABNORMAL LOW (ref 3.87–5.11)
RDW: 16 % — ABNORMAL HIGH (ref 11.5–15.5)
WBC: 2.5 10*3/uL — ABNORMAL LOW (ref 4.0–10.5)
nRBC: 0 % (ref 0.0–0.2)

## 2023-10-29 LAB — SAMPLE TO BLOOD BANK

## 2023-10-29 MED ORDER — EPOETIN ALFA-EPBX 20000 UNIT/ML IJ SOLN
20000.0000 [IU] | Freq: Once | INTRAMUSCULAR | Status: AC
  Administered 2023-10-29: 20000 [IU] via SUBCUTANEOUS
  Filled 2023-10-29: qty 1

## 2023-10-29 NOTE — Progress Notes (Signed)
 Patient tolerated injection with no complaints voiced. Site clean and dry with no bruising or swelling noted at site. See MAR for details. Band aid applied.  Patient stable during and after injection. VSS with discharge and left in satisfactory condition with no s/s of distress noted.

## 2023-10-29 NOTE — Patient Instructions (Signed)

## 2023-11-04 ENCOUNTER — Ambulatory Visit: Payer: Self-pay | Admitting: Family Medicine

## 2023-11-04 ENCOUNTER — Encounter (HOSPITAL_BASED_OUTPATIENT_CLINIC_OR_DEPARTMENT_OTHER): Payer: Self-pay

## 2023-11-04 ENCOUNTER — Ambulatory Visit (INDEPENDENT_AMBULATORY_CARE_PROVIDER_SITE_OTHER): Admitting: Family Medicine

## 2023-11-04 ENCOUNTER — Encounter: Payer: Self-pay | Admitting: Family Medicine

## 2023-11-04 VITALS — BP 185/65 | HR 63 | Temp 97.9°F | Ht 64.0 in | Wt 110.2 lb

## 2023-11-04 DIAGNOSIS — R0689 Other abnormalities of breathing: Secondary | ICD-10-CM

## 2023-11-04 DIAGNOSIS — J189 Pneumonia, unspecified organism: Secondary | ICD-10-CM

## 2023-11-04 DIAGNOSIS — R0989 Other specified symptoms and signs involving the circulatory and respiratory systems: Secondary | ICD-10-CM

## 2023-11-04 DIAGNOSIS — R059 Cough, unspecified: Secondary | ICD-10-CM

## 2023-11-04 DIAGNOSIS — R531 Weakness: Secondary | ICD-10-CM

## 2023-11-04 DIAGNOSIS — Z9981 Dependence on supplemental oxygen: Secondary | ICD-10-CM | POA: Diagnosis not present

## 2023-11-04 MED ORDER — DOXYCYCLINE HYCLATE 100 MG PO TABS: 100.0000 mg | ORAL_TABLET | Freq: Two times a day (BID) | ORAL | 0 refills | Status: AC

## 2023-11-04 MED ORDER — GUAIFENESIN ER 600 MG PO TB12: 600.0000 mg | ORAL_TABLET | Freq: Two times a day (BID) | ORAL | 0 refills | Status: AC

## 2023-11-04 MED ORDER — CEFDINIR 300 MG PO CAPS: 300.0000 mg | ORAL_CAPSULE | Freq: Every day | ORAL | 0 refills | Status: AC

## 2023-11-04 NOTE — Telephone Encounter (Signed)
 Copied from CRM [id number]. Topic: Clinical - Red Word Triage >> Nov 04, 2023  2:42 PM Elle L wrote: Red Word that prompted transfer to Nurse Triage: The patient requested to speak to a Nurse as she is feeling unwell. She has a worsening cough, discolored mucus, and feels weak. She had Pneumonia in November and is concerned that it could be a chest infection or Pneumonia.  Chief Complaint: cough worsened white phlegm Symptoms: mild SOB, weakness Frequency: ongoing Pertinent Negatives: Patient denies fever, chest pain Disposition: [] ED /[] Urgent Care (no appt availability in office) / [] Appointment(In office/virtual)/ []  [facility] [facility]/ [] Home Care/ [] Refused Recommended Disposition /[] [place] [facility]/ [x]  Follow-up with PCP Additional Notes: called office and spoke with Mitzi who stated she will discuss with PCP about appointment and call pt.  Reason for Disposition  [1] Known COPD or other severe lung disease (i.e., bronchiectasis, cystic fibrosis, lung surgery) AND [2] worsening symptoms (i.e., increased sputum purulence or amount, increased breathing difficulty  Answer Assessment - Initial Assessment Questions 1. ONSET: "When did the cough begin?"      Ongoing  2. SEVERITY: "How bad is the cough today?"      frequent 3. SPUTUM: "Describe the color of your sputum" (none, dry cough; clear, white, yellow, green)     white 4. HEMOPTYSIS: "Are you coughing up any blood?" If so ask: "How much?" (flecks, streaks, tablespoons, etc.)     no 5. DIFFICULTY BREATHING: "Are you having difficulty breathing?" If Yes, ask: "How bad is it?" (e.g., mild, moderate, severe)    - MILD: No SOB at rest, mild SOB with walking, speaks normally in sentences, can lie down, no retractions, pulse < 100.    - MODERATE: SOB at rest, SOB with minimal exertion and prefers to sit, cannot lie down flat, speaks in phrases, mild retractions, audible wheezing, pulse 100-120.    - SEVERE: Very SOB at rest,  speaks in single words, struggling to breathe, sitting hunched forward, retractions, pulse > 120      SOB O2  2 lpm sats 96-98 6. FEVER: "Do you have a fever?" If Yes, ask: "What is your temperature, how was it measured, and when did it start?"     no 8. LUNG HISTORY: "Do you have any history of lung disease?"  (e.g., pulmonary embolus, asthma, emphysema)    On oxygen   10. OTHER SYMPTOMS: "Do you have any other symptoms?" (e.g., runny nose, wheezing, chest pain)       Weak  Protocols used: Cough - Acute Productive-A-AH

## 2023-11-04 NOTE — Progress Notes (Signed)
 Subjective:  Patient ID: [name], female    DOB: Dec 02, 1933, 89 y.o.   MRN: [id number]  Patient Care Team: [name], DO as PCP - General (Family Medicine) Rollene Rotunda, MD as PCP - Cardiology (Cardiology) Lonie Peak, MD as Attending Physician (Radiation Oncology) Malachy Mood, MD as Consulting Physician (Hematology) Dagoberto Ligas, MD as Consulting Physician (Nephrology) [facility] Darolyn Rua as Physician Assistant (Oncology) Clinton Gallant, RN as [facility] Care Management   Chief Complaint:  Extremity Weakness and Cough (Patient states that this has been going on for several days. )   HPI: [name] is a 89 y.o. female presenting on 11/04/2023 for Extremity Weakness and Cough (Patient states that this has been going on for several days. )   Discussed the use of AI scribe software for clinical note transcription with the patient, who gave verbal consent to proceed.  History of Present Illness   [name] is an 89 year old female with interstitial lung disease who presents with a cough and concern for pneumonia recurrence.  She has been experiencing a cough for the past three days, producing white mucus. No fever or chills are present. She is concerned about the possibility of pneumonia recurrence, as she had double pneumonia in October. She denies any recent exposure to sick individuals.  She has a history of interstitial lung disease, which increases her risk for respiratory infections such as pneumonia.  She has been taking Zyrtec for three days but did not take it yesterday. She is cautious with medications due to kidney concerns. She is on two liters of oxygen continuously and uses albuterol at home.  She mentions difficulty with bowel movements, describing herself as 'a little constipated,' but reports urinating normally.  No dizziness, changes in her cardiac medications, or recent cardiology visits. She has not  experienced any confusion, or changes in her drinking habits. She does report general weakness / malaise today.       Relevant past medical, surgical, family, and social history reviewed and updated as indicated.  Allergies and medications reviewed and updated. Data reviewed: Chart in Epic.   Past Medical History:  Diagnosis Date   Anemia    Arthritis    Asthma    Atrial fibrillation (HCC)    Basal cell carcinoma 02/06/2009   Left ear targus- (MOHS)   Basal cell carcinoma 09/28/2008   Right back-(CX35FU)   CKD (chronic kidney disease), stage III (HCC)    Heart murmur    Hyperlipidemia    Hypertension    Hypothyroidism    Nodule of right lung    Right upper lobe   Osteopenia due to cancer therapy 10/07/2022   Pneumonia    PONV (postoperative nausea and vomiting)    Renal insufficiency    Chronic   Renal vascular disease    Right renal artery stenosis (HCC) 05/09/2015   Squamous cell carcinoma of skin 09/07/2019   KA-Left shin-txpbx   Vertigo     Past Surgical History:  Procedure Laterality Date   BIOPSY  04/09/2021   Procedure: BIOPSY;  Surgeon: Dolores Frame, MD;  [facility];  Service: Gastroenterology;;   BREAST LUMPECTOMY WITH RADIOACTIVE SEED LOCALIZATION Right 04/08/2018   Procedure: BREAST LUMPECTOMY WITH RADIOACTIVE SEED LOCALIZATION;  Surgeon: Emelia Loron, MD;  Location: [facility] OR;  Service: General;  Laterality: Right;   COLONOSCOPY  2016   diverticulosis, hemorrhoids   ESOPHAGOGASTRODUODENOSCOPY  2016  normal   ESOPHAGOGASTRODUODENOSCOPY (EGD) WITH PROPOFOL N/A 04/09/2021   Procedure: ESOPHAGOGASTRODUODENOSCOPY (EGD) WITH PROPOFOL;  Surgeon: Dolores Frame, MD;  Location: [facility];  Service: Gastroenterology;  Laterality: N/A;  12:45   ESOPHAGOGASTRODUODENOSCOPY (EGD) WITH PROPOFOL N/A 09/01/2021   Procedure: ESOPHAGOGASTRODUODENOSCOPY (EGD) WITH PROPOFOL;  Surgeon: Lanelle Bal, DO;  [facility];   Service: Endoscopy;  Laterality: N/A;   IR GENERIC HISTORICAL  08/29/2016   IR US GUIDE VASC ACCESS RIGHT 08/29/2016 [facility]   IR GENERIC HISTORICAL  08/29/2016   IR RENAL BILAT S&I MOD SED 08/29/2016 [facility]   KNEE ARTHROSCOPY Right    2019   THYROIDECTOMY, PARTIAL Right 1981   middle lobe removed 1st, then right lobe removed 7-8 years later (approx. 74)    Social History   Socioeconomic History   Marital status: Widowed    Spouse name: Not on file   Number of children: 2   Years of education: 65   Highest education level: Not on file  Occupational History   Occupation: Retired  Tobacco Use   Smoking status: Never   Smokeless tobacco: Never  Vaping Use   Vaping status: Never Used  Substance and Sexual Activity   Alcohol use: No   Drug use: No   Sexual activity: Not Currently  Other Topics Concern   Not on file  Social History Narrative   Lives alone, but her daughter stays there at night   Ambidextrous (writes with left hand).   No caffeine use.   Social Drivers of Corporate investment banker Strain: Low Risk  (12/15/2022)   Overall Financial Resource Strain (CARDIA)    Difficulty of Paying Living Expenses: Not hard at all  Food Insecurity: No Food Insecurity (05/28/2023)   Hunger Vital Sign    Worried About Running Out of Food in the Last Year: Never true    Ran Out of Food in the Last Year: Never true  Transportation Needs: No Transportation Needs (05/28/2023)   PRAPARE - Administrator, Civil Service (Medical): No    Lack of Transportation (Non-Medical): No  Physical Activity: Inactive (12/15/2022)   Exercise Vital Sign    Days of Exercise per Week: 0 days    Minutes of Exercise per Session: 0 min  Stress: No Stress Concern Present (12/15/2022)   [employer] of Occupational Health - Occupational Stress Questionnaire    Feeling of Stress : Not at all  Social Connections: Moderately Isolated (12/15/2022)   Social Connection and  Isolation Panel [NHANES]    Frequency of Communication with Friends and Family: More than three times a week    Frequency of Social Gatherings with Friends and Family: Three times a week    Attends Religious Services: More than 4 times per year    Active Member of Clubs or Organizations: No    Attends Banker Meetings: Never    Marital Status: Widowed  Intimate Partner Violence: Not At Risk (05/22/2023)   Humiliation, Afraid, Rape, and Kick questionnaire    Fear of Current or Ex-Partner: No    Emotionally Abused: No    Physically Abused: No    Sexually Abused: No    Outpatient Encounter Medications as of 11/04/2023  Medication Sig   albuterol (VENTOLIN HFA) 108 (90 Base) MCG/ACT inhaler Inhale 2 puffs into the lungs every 6 (six) hours as needed for wheezing or shortness of breath.   atorvastatin (LIPITOR) 20 MG tablet TAKE 1/2 TABLET BY MOUTH  DAILY   bisoprolol (ZEBETA) 5 MG tablet TAKE 1 TABLET (5 MG TOTAL) BY MOUTH DAILY.   cefdinir (OMNICEF) 300 MG capsule Take 1 capsule (300 mg total) by mouth daily for 7 days.   doxycycline (VIBRA-TABS) 100 MG tablet Take 1 tablet (100 mg total) by mouth 2 (two) times daily for 7 days. 1 po bid   feeding supplement (ENSURE ENLIVE / ENSURE PLUS) LIQD Take 237 mLs by mouth 2 (two) times daily between meals. (Patient taking differently: Take 237 mLs by mouth daily as needed (decreased appetite).)   ferrous sulfate 325 (65 FE) MG tablet Take 325 mg by mouth daily with breakfast.   flecainide (TAMBOCOR) 50 MG tablet TAKE 1 TABLET BY MOUTH TWICE A DAY   Fluticasone-Umeclidin-Vilant (TRELEGY ELLIPTA) 100-62.5-25 MCG/ACT AEPB Inhale 1 puff into the lungs daily.   guaiFENesin (MUCINEX) 600 MG 12 hr tablet Take 1 tablet (600 mg total) by mouth 2 (two) times daily for 10 days.   hydrALAZINE (APRESOLINE) 50 MG tablet TAKE 1 TABLET BY MOUTH TWICE A DAY   levothyroxine (SYNTHROID) 75 MCG tablet TAKE 1 TABLET BY MOUTH EVERY DAY (Patient taking  differently: Take 75 mcg by mouth daily before breakfast.)   mirtazapine (REMERON) 7.5 MG tablet TAKE 1 TABLET BY MOUTH EVERYDAY AT BEDTIME   Multiple Vitamins-Minerals (MULTIVITAMIN WOMEN 50+) TABS Take 1 tablet by mouth at bedtime.   pantoprazole (PROTONIX) 40 MG tablet Take 1 tablet (40 mg total) by mouth daily.   torsemide (DEMADEX) 20 MG tablet TAKE 1 TABLET BY MOUTH TWICE A DAY   [DISCONTINUED] azelastine (ASTELIN) 0.1 % nasal spray Place 1 spray into both nostrils 2 (two) times daily. For runny nose   [DISCONTINUED] azithromycin (ZITHROMAX Z-PAK) 250 MG tablet As directed   No facility-administered encounter medications on file as of 11/04/2023.    Allergies  Allergen Reactions   Norvasc [Amlodipine] Swelling    Edema    Tape Other (See Comments)    Has to have no stick tape.   Zestril [Lisinopril] Cough   Hytrin [Terazosin] Hives and Nausea Only   Imdur [Isosorbide Nitrate] Nausea Only   Sulfonamide Derivatives Nausea And Vomiting and Rash    Pertinent ROS per HPI, otherwise unremarkable      Objective:  BP (!) 185/65   Pulse 63   Temp 97.9 F (36.6 C)   Ht 5\' 4"  (1.626 m)   Wt 110 lb 3.2 oz (50 kg)   SpO2 92%   BMI 18.92 kg/m    Wt Readings from Last 3 Encounters:  11/04/23 110 lb 3.2 oz (50 kg)  10/02/23 110 lb 12.8 oz (50.3 kg)  09/16/23 110 lb 6.4 oz (50.1 kg)    Physical Exam Vitals and nursing note reviewed.  Constitutional:      General: She is not in acute distress.    Appearance: Normal appearance. She is ill-appearing (chronically ill). She is not toxic-appearing or diaphoretic.  HENT:     Head: Normocephalic and atraumatic.     Right Ear: Tympanic membrane, ear canal and external ear normal.     Left Ear: Tympanic membrane, ear canal and external ear normal.     Nose: Nose normal.     Mouth/Throat:     Mouth: Mucous membranes are moist.     Pharynx: Oropharynx is clear.  Eyes:     Conjunctiva/sclera: Conjunctivae normal.     Pupils: Pupils  are equal, round, and reactive to light.  Cardiovascular:     Rate and  Rhythm: Normal rate and regular rhythm.     Heart sounds: Murmur heard.     Systolic murmur is present with a grade of 4/6.     Comments: Murmur radiates to carotids Pulmonary:     Breath sounds: Examination of the left-lower field reveals wheezing and rhonchi. Decreased breath sounds, wheezing and rhonchi present. No rales.     Comments: Supplemental O2 at 2L via [place] Musculoskeletal:     Cervical back: Neck supple.     Right lower leg: No edema.     Left lower leg: No edema.  Skin:    General: Skin is warm and dry.     Capillary Refill: Capillary refill takes less than 2 seconds.  Neurological:     General: No focal deficit present.     Mental Status: She is alert and oriented to person, place, and time.  Psychiatric:        Mood and Affect: Mood normal.        Behavior: Behavior normal.        Thought Content: Thought content normal.        Judgment: Judgment normal.      Results for orders placed or performed in visit on 10/29/23  CBC   Collection Time: 10/29/23 10:09 AM  Result Value Ref Range   WBC 2.5 (L) 4.0 - 10.5 K/uL   RBC 3.16 (L) 3.87 - 5.11 MIL/uL   Hemoglobin 8.8 (L) 12.0 - 15.0 g/dL   HCT 16.1 (L) 09.6 - 04.5 %   MCV 88.9 80.0 - 100.0 fL   MCH 27.8 26.0 - 34.0 pg   MCHC 31.3 30.0 - 36.0 g/dL   RDW 40.9 (H) 81.1 - 91.4 %   Platelets 88 (L) 150 - 400 K/uL   nRBC 0.0 0.0 - 0.2 %  Sample to Blood Bank   Collection Time: 10/29/23 10:09 AM  Result Value Ref Range   Blood Bank Specimen SAMPLE AVAILABLE FOR TESTING    Sample Expiration      11/01/2023,2359 Performed at [facility], [address], [place], [place] [address]    *Note: Due to a large number of results and/or encounters for the requested time period, some results have not been displayed. A complete set of results can be found in Results Review.       Pertinent labs & imaging results that were available during my care of  the patient were reviewed by me and considered in my medical decision making.  Assessment & Plan:  [name] was seen today for extremity weakness and cough.  Diagnoses and all orders for this visit:  Community acquired pneumonia of left lower lobe of lung -     guaiFENesin (MUCINEX) 600 MG 12 hr tablet; Take 1 tablet (600 mg total) by mouth 2 (two) times daily for 10 days. -     cefdinir (OMNICEF) 300 MG capsule; Take 1 capsule (300 mg total) by mouth daily for 7 days. -     doxycycline (VIBRA-TABS) 100 MG tablet; Take 1 tablet (100 mg total) by mouth 2 (two) times daily for 7 days. 1 po bid  Cough in adult -     guaiFENesin (MUCINEX) 600 MG 12 hr tablet; Take 1 tablet (600 mg total) by mouth 2 (two) times daily for 10 days. -     cefdinir (OMNICEF) 300 MG capsule; Take 1 capsule (300 mg total) by mouth daily for 7 days. -     doxycycline (VIBRA-TABS) 100 MG tablet; Take 1  tablet (100 mg total) by mouth 2 (two) times daily for 7 days. 1 po bid  Chest congestion -     guaiFENesin (MUCINEX) 600 MG 12 hr tablet; Take 1 tablet (600 mg total) by mouth 2 (two) times daily for 10 days. -     cefdinir (OMNICEF) 300 MG capsule; Take 1 capsule (300 mg total) by mouth daily for 7 days. -     doxycycline (VIBRA-TABS) 100 MG tablet; Take 1 tablet (100 mg total) by mouth 2 (two) times daily for 7 days. 1 po bid  Adventitious breath sounds -     guaiFENesin (MUCINEX) 600 MG 12 hr tablet; Take 1 tablet (600 mg total) by mouth 2 (two) times daily for 10 days. -     cefdinir (OMNICEF) 300 MG capsule; Take 1 capsule (300 mg total) by mouth daily for 7 days. -     doxycycline (VIBRA-TABS) 100 MG tablet; Take 1 tablet (100 mg total) by mouth 2 (two) times daily for 7 days. 1 po bid  General weakness -     guaiFENesin (MUCINEX) 600 MG 12 hr tablet; Take 1 tablet (600 mg total) by mouth 2 (two) times daily for 10 days. -     cefdinir (OMNICEF) 300 MG capsule; Take 1 capsule (300 mg total) by mouth daily for 7  days. -     doxycycline (VIBRA-TABS) 100 MG tablet; Take 1 tablet (100 mg total) by mouth 2 (two) times daily for 7 days. 1 po bid  Supplemental oxygen dependent -     guaiFENesin (MUCINEX) 600 MG 12 hr tablet; Take 1 tablet (600 mg total) by mouth 2 (two) times daily for 10 days. -     cefdinir (OMNICEF) 300 MG capsule; Take 1 capsule (300 mg total) by mouth daily for 7 days. -     doxycycline (VIBRA-TABS) 100 MG tablet; Take 1 tablet (100 mg total) by mouth 2 (two) times daily for 7 days. 1 po bid     Assessment and Plan    Suspected Pneumonia Presents with a productive cough with white mucus, general weakness, and significant bronchi wheezing in the left lower lobe. Pneumonia is suspected due to her previous double pneumonia in October and underlying interstitial lung disease, which increases her risk. Despite the absence of fever and chills, lung sounds and symptoms suggest pneumonia. Differential diagnosis includes infection, fluid, or other causes of ronchi and wheezing. Due to lack of x-ray availability today, treatment will proceed as if it is pneumonia. Informed consent includes understanding that the prescribed antibiotics will not affect her heart or kidneys, considering her allergies and renal function. - Prescribe cefdinir 300 mg once daily for 7 days - Prescribe doxycycline 100 mg twice daily for 7 days - Prescribe Mucinex and advise to maintain hydration - Instruct to use albuterol every six hours while awake - Schedule follow-up appointment with Doctor Nadine Counts on Friday for re-evaluation and chest x-ray - Advise to go to the hospital if symptoms worsen, such as increased shortness of breath, weakness, or dizziness  Constipation Reports difficulty with bowel movements, indicating constipation.  Goals of Care Expresses concern about taking medications due to kidney issues and allergies to sulfa drugs. Reassured that the prescribed antibiotics will not affect her heart or  kidneys.          Continue all other maintenance medications.  Follow up plan: Return in about 2 days (around 11/06/2023), or if symptoms worsen or fail to improve.   Continue healthy lifestyle  choices, including diet (rich in fruits, vegetables, and lean proteins, and low in salt and simple carbohydrates) and exercise (at least 30 minutes of moderate physical activity daily).   The above assessment and management plan was discussed with the patient. The patient verbalized understanding of and has agreed to the management plan. Patient is aware to call the clinic if they develop any new symptoms or if symptoms persist or worsen. Patient is aware when to return to the clinic for a follow-up visit. Patient educated on when it is appropriate to go to the emergency department.   Kari Baars, FNP-C [facility] [phone]

## 2023-11-06 ENCOUNTER — Encounter: Payer: Self-pay | Admitting: Family Medicine

## 2023-11-06 ENCOUNTER — Ambulatory Visit: Admitting: Family Medicine

## 2023-11-06 ENCOUNTER — Ambulatory Visit (INDEPENDENT_AMBULATORY_CARE_PROVIDER_SITE_OTHER)

## 2023-11-06 VITALS — BP 175/65 | HR 62 | Temp 97.9°F | Ht 64.0 in | Wt 110.0 lb

## 2023-11-06 DIAGNOSIS — J189 Pneumonia, unspecified organism: Secondary | ICD-10-CM

## 2023-11-06 DIAGNOSIS — I517 Cardiomegaly: Secondary | ICD-10-CM | POA: Diagnosis not present

## 2023-11-06 DIAGNOSIS — R918 Other nonspecific abnormal finding of lung field: Secondary | ICD-10-CM | POA: Diagnosis not present

## 2023-11-06 NOTE — Progress Notes (Signed)
 Subjective: CC: Left lower lobe pneumonia PCP: [name], DO [id number] B [name] is a 89 y.o. female presenting to clinic today for:  1.  Left lower lobe pneumonia Patient was seen 48 hours ago for suspected left lower lobe pneumonia.  She was placed on dual therapy with Omnicef and doxycycline given history of atrial fibrillation on Tambocor provider avoided utilization of a macrolide.  She reports that she does feel slightly better than when she presented 2 days ago.  She has chronic respiratory issues but this is relieved by oxygen via nasal cannula and albuterol which she is prescribed.  Last use was this morning.  She denies any fevers, chills, hemoptysis, chest pain or worsening of breathing   ROS: Per HPI  Allergies  Allergen Reactions   Norvasc [Amlodipine] Swelling    Edema    Tape Other (See Comments)    Has to have no stick tape.   Zestril [Lisinopril] Cough   Hytrin [Terazosin] Hives and Nausea Only   Imdur [Isosorbide Nitrate] Nausea Only   Sulfonamide Derivatives Nausea And Vomiting and Rash   Past Medical History:  Diagnosis Date   Anemia    Arthritis    Asthma    Atrial fibrillation (HCC)    Basal cell carcinoma 02/06/2009   Left ear targus- (MOHS)   Basal cell carcinoma 09/28/2008   Right back-(CX35FU)   CKD (chronic kidney disease), stage III (HCC)    Heart murmur    Hyperlipidemia    Hypertension    Hypothyroidism    Nodule of right lung    Right upper lobe   Osteopenia due to cancer therapy 10/07/2022   Pneumonia    PONV (postoperative nausea and vomiting)    Renal insufficiency    Chronic   Renal vascular disease    Right renal artery stenosis (HCC) 05/09/2015   Squamous cell carcinoma of skin 09/07/2019   KA-Left shin-txpbx   Vertigo     Current Outpatient Medications:    albuterol (VENTOLIN HFA) 108 (90 Base) MCG/ACT inhaler, Inhale 2 puffs into the lungs every 6 (six) hours as needed for wheezing or shortness of breath., Disp: 8  g, Rfl: 6   atorvastatin (LIPITOR) 20 MG tablet, TAKE 1/2 TABLET BY MOUTH DAILY, Disp: 45 tablet, Rfl: 1   bisoprolol (ZEBETA) 5 MG tablet, TAKE 1 TABLET (5 MG TOTAL) BY MOUTH DAILY., Disp: 90 tablet, Rfl: 0   cefdinir (OMNICEF) 300 MG capsule, Take 1 capsule (300 mg total) by mouth daily for 7 days., Disp: 7 capsule, Rfl: 0   doxycycline (VIBRA-TABS) 100 MG tablet, Take 1 tablet (100 mg total) by mouth 2 (two) times daily for 7 days. 1 po bid, Disp: 14 tablet, Rfl: 0   feeding supplement (ENSURE ENLIVE / ENSURE PLUS) LIQD, Take 237 mLs by mouth 2 (two) times daily between meals. (Patient taking differently: Take 237 mLs by mouth daily as needed (decreased appetite).), Disp: 237 mL, Rfl: 12   ferrous sulfate 325 (65 FE) MG tablet, Take 325 mg by mouth daily with breakfast., Disp: , Rfl:    flecainide (TAMBOCOR) 50 MG tablet, TAKE 1 TABLET BY MOUTH TWICE A DAY, Disp: 180 tablet, Rfl: 2   Fluticasone-Umeclidin-Vilant (TRELEGY ELLIPTA) 100-62.5-25 MCG/ACT AEPB, Inhale 1 puff into the lungs daily., Disp: , Rfl:    guaiFENesin (MUCINEX) 600 MG 12 hr tablet, Take 1 tablet (600 mg total) by mouth 2 (two) times daily for 10 days., Disp: 20 tablet, Rfl: 0   hydrALAZINE (APRESOLINE) 50  MG tablet, TAKE 1 TABLET BY MOUTH TWICE A DAY, Disp: 180 tablet, Rfl: 3   levothyroxine (SYNTHROID) 75 MCG tablet, TAKE 1 TABLET BY MOUTH EVERY DAY (Patient taking differently: Take 75 mcg by mouth daily before breakfast.), Disp: 90 tablet, Rfl: 3   mirtazapine (REMERON) 7.5 MG tablet, TAKE 1 TABLET BY MOUTH EVERYDAY AT BEDTIME, Disp: 90 tablet, Rfl: 3   Multiple Vitamins-Minerals (MULTIVITAMIN WOMEN 50+) TABS, Take 1 tablet by mouth at bedtime., Disp: , Rfl:    pantoprazole (PROTONIX) 40 MG tablet, Take 1 tablet (40 mg total) by mouth daily., Disp: 90 tablet, Rfl: 1   torsemide (DEMADEX) 20 MG tablet, TAKE 1 TABLET BY MOUTH TWICE A DAY, Disp: 180 tablet, Rfl: 0 Social History   Socioeconomic History   Marital status: Widowed     Spouse name: Not on file   Number of children: 2   Years of education: 61   Highest education level: Not on file  Occupational History   Occupation: Retired  Tobacco Use   Smoking status: Never   Smokeless tobacco: Never  Vaping Use   Vaping status: Never Used  Substance and Sexual Activity   Alcohol use: No   Drug use: No   Sexual activity: Not Currently  Other Topics Concern   Not on file  Social History Narrative   Lives alone, but her daughter stays there at night   Ambidextrous (writes with left hand).   No caffeine use.   Social Drivers of Corporate investment banker Strain: Low Risk  (12/15/2022)   Overall Financial Resource Strain (CARDIA)    Difficulty of Paying Living Expenses: Not hard at all  Food Insecurity: No Food Insecurity (05/28/2023)   Hunger Vital Sign    Worried About Running Out of Food in the Last Year: Never true    Ran Out of Food in the Last Year: Never true  Transportation Needs: No Transportation Needs (05/28/2023)   PRAPARE - Administrator, Civil Service (Medical): No    Lack of Transportation (Non-Medical): No  Physical Activity: Inactive (12/15/2022)   Exercise Vital Sign    Days of Exercise per Week: 0 days    Minutes of Exercise per Session: 0 min  Stress: No Stress Concern Present (12/15/2022)   [employer] of Occupational Health - Occupational Stress Questionnaire    Feeling of Stress : Not at all  Social Connections: Moderately Isolated (12/15/2022)   Social Connection and Isolation Panel [NHANES]    Frequency of Communication with Friends and Family: More than three times a week    Frequency of Social Gatherings with Friends and Family: Three times a week    Attends Religious Services: More than 4 times per year    Active Member of Clubs or Organizations: No    Attends Banker Meetings: Never    Marital Status: Widowed  Intimate Partner Violence: Not At Risk (05/22/2023)   Humiliation, Afraid, Rape,  and Kick questionnaire    Fear of Current or Ex-Partner: No    Emotionally Abused: No    Physically Abused: No    Sexually Abused: No   Family History  Problem Relation Age of Onset   Thyroid disease Mother    Stroke Father    Hypertension Brother    Lung cancer Maternal Uncle    Cancer Maternal Grandmother        Liver   Lung cancer Maternal Uncle    Hypertension Daughter    Hypercholesterolemia Daughter  Hypercholesterolemia Daughter     Objective: Office vital signs reviewed. BP (!) 175/65   Pulse 62   Temp 97.9 F (36.6 C)   Ht 5\' 4"  (1.626 m)   Wt 110 lb (49.9 kg)   SpO2 97%   BMI 18.88 kg/m   Physical Examination:  General: Awake, alert, chronically ill-appearing female, No acute distress HEENT: sclera white, MMM Cardio: regular rate and rhythm, S1S2 heard, loud systolic murmurs appreciated Pulm: Globally decreased breath sounds with no appreciable wheezes, rhonchi or rales.  She does have slightly decreased breath sounds in the left lower lung field compared to the rest.  She has normal work of breathing on oxygen via nasal cannula  Assessment/ Plan: 89 y.o. female   Community acquired pneumonia of left lower lobe of lung - Plan: DG Chest 2 View  I personally reviewed her chest x-ray and did appreciate slight haziness in the left lower lung fields.  Given normal saturation on oxygen via nasal cannula and absence of other concerning symptoms or signs, okay to continue dual therapy.  She understands that she should seek immediate medical attention in the ER should any concerning symptoms develop over the weekend.  She will follow-up with me in June at which point we will plan to administer a pneumococcal 20 vaccination   Janet Schewe Hulen Skains, DO [facility] [phone]

## 2023-11-10 ENCOUNTER — Other Ambulatory Visit: Payer: Self-pay

## 2023-11-10 DIAGNOSIS — D61818 Other pancytopenia: Secondary | ICD-10-CM

## 2023-11-10 DIAGNOSIS — D649 Anemia, unspecified: Secondary | ICD-10-CM

## 2023-11-10 DIAGNOSIS — D500 Iron deficiency anemia secondary to blood loss (chronic): Secondary | ICD-10-CM

## 2023-11-10 DIAGNOSIS — C50511 Malignant neoplasm of lower-outer quadrant of right female breast: Secondary | ICD-10-CM

## 2023-11-10 DIAGNOSIS — D696 Thrombocytopenia, unspecified: Secondary | ICD-10-CM

## 2023-11-10 NOTE — Progress Notes (Unsigned)
 [facility] [address], [place] [address]   CLINIC:  Medical Oncology/Hematology  PCP:  [name], DO [address] [place] [address] [phone]   REASON FOR VISIT:  Follow-up for normocytic anemia + leukopenia/thrombocytopenia + history of breast cancer  BRIEF ONCOLOGIC HISTORY:   Oncology History Overview Note  Cancer Staging Malignant neoplasm of lower-outer quadrant of right breast of female, estrogen receptor positive (HCC) Staging form: Breast, AJCC 8th Edition - Clinical stage from 02/22/2018: Stage IA (cT1c, cN0, cM0, G1, ER+, PR+, HER2-) - Signed by Malachy Mood, MD on 03/02/2018    Malignant neoplasm of lower-outer quadrant of right breast of female, estrogen receptor positive (HCC)  02/12/2018 Mammogram   She had routine screening bilateral mammography on 02/12/2018 at [facility] with results showing: indeterminate irregular mass in the right breast.    02/16/2018 Mammogram   She underwent right diagnostic mammography with tomography and right breast ultrasonography at [facility] on 02/16/2018 showing: 1.1 cm irregular mass at the 8 O'clock position on the right breast   02/22/2018 Pathology Results   Diagnosis 1. Breast, right, needle core biopsy, lateral, 8 o'clock - INVASIVE AND IN SITU DUCTAL CARCINOMA. - SEE COMMENT. 2. Breast, right, needle core biopsy, 11 o'clock - FIBROCYSTIC CHANGES. - USUAL DUCTAL HYPERPLASIA. - THERE IS NO EVIDENCE OF MALIGNANCY.   02/22/2018 Receptors her2   IMMUNOHISTOCHEMICAL AND MORPHOMETRIC ANALYSIS PERFORMED MANUALLY Estrogen Receptor: 100%, POSITIVE, STRONG STAINING INTENSITY Progesterone Receptor: 40%, POSITIVE, MODERATE STAINING INTENSITY Proliferation Marker Ki67: 1% Her2 Negative   02/22/2018 Cancer Staging   Staging form: Breast, AJCC 8th Edition - Clinical stage from 02/22/2018: Stage IA (cT1c, cN0, cM0, G1, ER+, PR+, HER2-) - Signed by Malachy Mood, MD on 03/02/2018    02/25/2018 Initial Diagnosis    Malignant neoplasm of lower-outer quadrant of right breast of female, estrogen receptor positive (HCC)     CANCER STAGING:  Cancer Staging  Malignant neoplasm of lower-outer quadrant of right breast of female, estrogen receptor positive (HCC) Staging form: Breast, AJCC 8th Edition - Clinical stage from 02/22/2018: Stage IA (cT1c, cN0, cM0, G1, ER+, PR+, HER2-) - Signed by Malachy Mood, MD on 03/02/2018   INTERVAL HISTORY:   [name], a 89 y.o. female, returns for routine follow-up of her breast cancer and pancytopenia. [name] was last seen on 08/20/2023 by NP Durenda Hurt.  *** She has not had any hospitalizations or major changes in her health since her last visit.  ***  At today's visit, she reports feeling ***, with worsening fatigue.  ***  PANCYTOPENIA: She has significant bruising of the extremities.  *** *** She reports that she bleeds easily when cut, but she denies any epistaxis, hematochezia, or melena. *** She has dyspnea on exertion related to her CHF, and is on supplemental oxygen around-the-clock. *** She denies any exertional chest pressure.  *** She denies any B symptoms or recent infections. *** She continues to take daily iron supplement with orange juice.  *** She takes daily vitamin B12 supplement.  ***  BREAST CANCER: Her history of breast cancer was addressed in detail at visit on 04/22/2023.  At today's visit, she does not have any concerns such as new breast lumps or lymphadenopathy.  *** *** She completed treatment with letrozole for breast cancer in October 2024. *** She reports that she is tolerating Prolia injections well and denies any bone pain, fractures, or jaw pain.  She reports 50***% energy and 100***% appetite.  She is maintaining stable weight at this time.  ASSESSMENT & PLAN:  1.  PANCYTOPENIA - Leukopenia and thrombocytopenia since 2015 in addition to anemia that is in part related to CKD, relative iron deficiency, and B12 deficiency. - Moderate  to high suspicion for MDS or other infiltrative bone marrow disorder, but bone marrow biopsy has been declined. - CT abdomen/pelvis (01/16/2021): Normal-sized spleen.  Liver without any noted abnormalities.   - Folate normal.  Borderline B12 deficiency noted on 12/04/2021 with normal B12 but elevated methylmalonic acid 605 - Rheumatoid factor and ANA negative. - History of acute blood loss anemia requiring 2 units PRBC in January 2023. - EGD (09/01/2021) showed mild chronic gastritis and nonbleeding duodenal ulcer - She is taking ferrous sulfate 325 mg and vitamin B12 500 mcg daily. *** - ESA therapy initiated in September 2024.  Current dose is Retacrit 20,000 units every 2 weeks. - Labs today (11/11/2023): *** Ferritin ***, iron saturation *** CMP ***.  LDH ***. - Labs from 04/06/2023 showed normal B12, but MMA elevated (likely due to CKD).  Normal LDH, and baseline creatinine with CKD stage IIIb/IV. - No bright red blood per rectum or melena.*** Admits to easy bruising, but denies petechial rash.  No B symptoms, frequent infections, or lymphadenopathy.*** - DIFFERENTIAL DIAGNOSIS includes probable MDS with multifactorial anemia from CKD, relative iron deficiency, B12 deficiency.   - PLAN: *** TBD *** Extensive discussion with patient and family member (granddaughter Morrie Sheldon) in clinic today (04/22/2023) which covered the following: Moderate to high clinical suspicion for MDS, but unable to definitively diagnose this without bone marrow biopsy.  Unable to rule out other bone marrow infiltrative processes (including possibility of lymphoma or leukemia) without bone marrow biopsy. Due to patient's advanced age and other comorbidities, they wish to decline bone marrow biopsy at this time. Will proceed with conservative treatment by starting ESA injections, which is an acceptable treatment for anemia from CKD as well as anemia from possible MDS.  - *** Retacrit dose *** Hypertension noted, but still within  treatment parameters at today's visit (BP 179/54). Hypertension managed by PCP and nephrologist.  Patient takes as needed clonidine at home.  - Continue daily iron tablet daily and vitamin B12 500 mcg daily.*** - Continue close monitoring and ongoing discussion regarding additional testing and treatment as appropriate.*** - RTC with labs (CBC/D, CMP, LDH, ferritin, iron/TIBC, B12, MMA) and office visit in *** months ***  2.  History of stage I right-sided breast cancer (2019) - She had a mammogram on 02/16/2018 which showed B RADS category 0 incomplete.  She was referred for a biopsy. - Right breast biopsy on 02/22/2018 consistent with IDC. - Right lumpectomy on 04/08/2018, IDC, 1.1 cm, grade 1, margins negative, ER/PR positive, HER2 negative, Ki-67 1%. - She declined adjuvant radiation therapy. - Completed 5 years of letrozole from 05/28/2018 through 06/18/2023.  - BCI testing (see Media file from 10/22/2022) showed NO benefit from endocrine therapy extended beyond 5 years, as she has 1.9% risk of late distant recurrence regardless of whether 5 to 10 years of aromatase inhibitor treatment is given. - Most recent mammogram (03/24/2023): BI-RADS Category 2, benign.  No evidence of breast malignancy in either breast. - Annual breast exam (04/22/2023) negative for any masses, nodules, or lymphadenopathy in either breast - History/ROS negative for any "red flag" symptoms of recurrent breast cancer.  *** - LFTs normal.  *** - PLAN:  Annual mammogram and breast exam due August 2025.   3.  Osteopenia - She is at risk for breast cancer associated bone loss in the setting of aromatase inhibitors since 2019 - DEXA scan done 03/07/2019 showed T-score -1.0 - DEXA scan from 04/23/2021 via [facility] was consistent with osteopenia (T-score -1.7) - Patient takes vitamin D and calcium supplements at home, with most recent vitamin D normal at 59.8 (09/30/2022) - FRAX from 04/23/2021 showed 10-year probability of hip fracture at  10.3% with 10-year risk of major osteoporotic fracture 17.2%, therefore started on Prolia in February 2024. - Tolerating Prolia well.  No fractures, bone pain, or jaw pain since last visit. - PLAN: Continue Prolia every 6 months (given today 11/10/23 - next due March 2025)  - She has upcoming bone density scan is scheduled by PCP. -- Continue calcium and vitamin D supplements with weight-bearing exercise as tolerated  PLAN SUMMARY: >> Prolia injection given TODAY *** >> ***CBC/BB sample + Retacrit 10,000 units every 2 weeks (first dose today) >> *** Labs in *** months = *** >> OFFICE visit in *** months (1 to 2 weeks after labs ***)  - Next Prolia due September 2025 - Next mammogram and breast exam due August 2025    REVIEW OF SYSTEMS: ***  Review of Systems  Constitutional:  Positive for fatigue. Negative for appetite change, chills, diaphoresis, fever and unexpected weight change.  HENT:   Negative for lump/mass and nosebleeds.   Eyes:  Negative for eye problems.  Respiratory:  Positive for shortness of breath. Negative for cough and hemoptysis.   Cardiovascular:  Positive for chest pain (if not wearing oxygen). Negative for leg swelling and palpitations.  Gastrointestinal:  Negative for abdominal pain, blood in stool, constipation, diarrhea, nausea and vomiting.  Genitourinary:  Negative for hematuria.   Skin: Negative.   Neurological:  Negative for dizziness, headaches and light-headedness.  Hematological:  Bruises/bleeds easily.    PHYSICAL EXAM:   Performance status (ECOG): 2 - Symptomatic, <50% confined to bed *** There were no vitals filed for this visit. Wt Readings from Last 3 Encounters:  11/06/23 110 lb (49.9 kg)  11/04/23 110 lb 3.2 oz (50 kg)  10/02/23 110 lb 12.8 oz (50.3 kg)   Physical Exam Constitutional:      Appearance: Normal appearance.     Comments: Thin body habitus  HENT:     Head: Normocephalic and atraumatic.     Mouth/Throat:     Mouth: Mucous  membranes are moist.  Eyes:     Extraocular Movements: Extraocular movements intact.     Pupils: Pupils are equal, round, and reactive to light.  Cardiovascular:     Rate and Rhythm: Normal rate and regular rhythm.     Pulses: Normal pulses.     Heart sounds: Murmur heard.  Pulmonary:     Effort: Pulmonary effort is normal.     Breath sounds: Decreased air movement present.  Chest:  Breasts:    Right: No swelling, bleeding, inverted nipple, mass, nipple discharge, skin change or tenderness.     Left: No swelling, bleeding, inverted nipple, mass, nipple discharge, skin change or tenderness.     Comments: LOQ lumpectomy scar WNL.  Abdominal:     General: Bowel sounds are normal.     Palpations: Abdomen is soft.     Tenderness: There is no abdominal tenderness.  Musculoskeletal:        General: No swelling.     Right lower leg: No edema.     Left lower leg: No edema.  Lymphadenopathy:  Cervical: No cervical adenopathy.  Skin:    General: Skin is warm and dry.     Findings: Bruising (extremities) present.  Neurological:     General: No focal deficit present.     Mental Status: She is alert and oriented to person, place, and time.  Psychiatric:        Mood and Affect: Mood normal.        Behavior: Behavior normal.     PAST MEDICAL/SURGICAL HISTORY:  Past Medical History:  Diagnosis Date   Anemia    Arthritis    Asthma    Atrial fibrillation (HCC)    Basal cell carcinoma 02/06/2009   Left ear targus- (MOHS)   Basal cell carcinoma 09/28/2008   Right back-([id number])   CKD (chronic kidney disease), stage III (HCC)    Heart murmur    Hyperlipidemia    Hypertension    Hypothyroidism    Nodule of right lung    Right upper lobe   Osteopenia due to cancer therapy 10/07/2022   Pneumonia    PONV (postoperative nausea and vomiting)    Renal insufficiency    Chronic   Renal vascular disease    Right renal artery stenosis (HCC) 05/09/2015   Squamous cell carcinoma of skin  09/07/2019   KA-Left shin-txpbx   Vertigo    Past Surgical History:  Procedure Laterality Date   BIOPSY  04/09/2021   Procedure: BIOPSY;  Surgeon: Dolores Frame, MD;  [facility];  Service: Gastroenterology;;   BREAST LUMPECTOMY WITH RADIOACTIVE SEED LOCALIZATION Right 04/08/2018   Procedure: BREAST LUMPECTOMY WITH RADIOACTIVE SEED LOCALIZATION;  Surgeon: Emelia Loron, MD;  Location[facility] OR;  Service: General;  Laterality: Right;   COLONOSCOPY  2016   diverticulosis, hemorrhoids   ESOPHAGOGASTRODUODENOSCOPY  2016   normal   ESOPHAGOGASTRODUODENOSCOPY (EGD) WITH PROPOFOL N/A 04/09/2021   Procedure: ESOPHAGOGASTRODUODENOSCOPY (EGD) WITH PROPOFOL;  Surgeon: Dolores Frame, MD;  Location: [facility];  Service: Gastroenterology;  Laterality: N/A;  12:45   ESOPHAGOGASTRODUODENOSCOPY (EGD) WITH PROPOFOL N/A 09/01/2021   Procedure: ESOPHAGOGASTRODUODENOSCOPY (EGD) WITH PROPOFOL;  Surgeon: Lanelle Bal, DO;  Location: [facility];  Service: Endoscopy;  Laterality: N/A;   IR GENERIC HISTORICAL  08/29/2016   IR US GUIDE VASC ACCESS RIGHT 08/29/2016 [facility]   IR GENERIC HISTORICAL  08/29/2016   IR RENAL BILAT S&I MOD SED 08/29/2016 [facility]   KNEE ARTHROSCOPY Right    2019   THYROIDECTOMY, PARTIAL Right 1981   middle lobe removed 1st, then right lobe removed 7-8 years later (approx. 1988)    SOCIAL HISTORY:  Social History   Socioeconomic History   Marital status: Widowed    Spouse name: Not on file   Number of children: 2   Years of education: 75   Highest education level: Not on file  Occupational History   Occupation: Retired  Tobacco Use   Smoking status: Never   Smokeless tobacco: Never  Vaping Use   Vaping status: Never Used  Substance and Sexual Activity   Alcohol use: No   Drug use: No   Sexual activity: Not Currently  Other Topics Concern   Not on file  Social History Narrative   Lives alone, but her daughter  stays there at night   Ambidextrous (writes with left hand).   No caffeine use.   Social Drivers of Health   Financial Resource Strain: Low Risk  (12/15/2022)   Overall Financial Resource Strain (CARDIA)    Difficulty of  Paying Living Expenses: Not hard at all  Food Insecurity: No Food Insecurity (05/28/2023)   Hunger Vital Sign    Worried About Running Out of Food in the Last Year: Never true    Ran Out of Food in the Last Year: Never true  Transportation Needs: No Transportation Needs (05/28/2023)   PRAPARE - Administrator, Civil Service (Medical): No    Lack of Transportation (Non-Medical): No  Physical Activity: Inactive (12/15/2022)   Exercise Vital Sign    Days of Exercise per Week: 0 days    Minutes of Exercise per Session: 0 min  Stress: No Stress Concern Present (12/15/2022)   [employer] of Occupational Health - Occupational Stress Questionnaire    Feeling of Stress : Not at all  Social Connections: Moderately Isolated (12/15/2022)   Social Connection and Isolation Panel [NHANES]    Frequency of Communication with Friends and Family: More than three times a week    Frequency of Social Gatherings with Friends and Family: Three times a week    Attends Religious Services: More than 4 times per year    Active Member of Clubs or Organizations: No    Attends Banker Meetings: Never    Marital Status: Widowed  Intimate Partner Violence: Not At Risk (05/22/2023)   Humiliation, Afraid, Rape, and Kick questionnaire    Fear of Current or Ex-Partner: No    Emotionally Abused: No    Physically Abused: No    Sexually Abused: No    FAMILY HISTORY:  Family History  Problem Relation Age of Onset   Thyroid disease Mother    Stroke Father    Hypertension Brother    Lung cancer Maternal Uncle    Cancer Maternal Grandmother        Liver   Lung cancer Maternal Uncle    Hypertension Daughter    Hypercholesterolemia Daughter    Hypercholesterolemia  Daughter     CURRENT MEDICATIONS:  Current Outpatient Medications  Medication Sig Dispense Refill   albuterol (VENTOLIN HFA) 108 (90 Base) MCG/ACT inhaler Inhale 2 puffs into the lungs every 6 (six) hours as needed for wheezing or shortness of breath. 8 g 6   atorvastatin (LIPITOR) 20 MG tablet TAKE 1/2 TABLET BY MOUTH DAILY 45 tablet 1   bisoprolol (ZEBETA) 5 MG tablet TAKE 1 TABLET (5 MG TOTAL) BY MOUTH DAILY. 90 tablet 0   cefdinir (OMNICEF) 300 MG capsule Take 1 capsule (300 mg total) by mouth daily for 7 days. 7 capsule 0   doxycycline (VIBRA-TABS) 100 MG tablet Take 1 tablet (100 mg total) by mouth 2 (two) times daily for 7 days. 1 po bid 14 tablet 0   feeding supplement (ENSURE ENLIVE / ENSURE PLUS) LIQD Take 237 mLs by mouth 2 (two) times daily between meals. (Patient taking differently: Take 237 mLs by mouth daily as needed (decreased appetite).) 237 mL 12   ferrous sulfate 325 (65 FE) MG tablet Take 325 mg by mouth daily with breakfast.     flecainide (TAMBOCOR) 50 MG tablet TAKE 1 TABLET BY MOUTH TWICE A DAY 180 tablet 2   Fluticasone-Umeclidin-Vilant (TRELEGY ELLIPTA) 100-62.5-25 MCG/ACT AEPB Inhale 1 puff into the lungs daily.     guaiFENesin (MUCINEX) 600 MG 12 hr tablet Take 1 tablet (600 mg total) by mouth 2 (two) times daily for 10 days. 20 tablet 0   hydrALAZINE (APRESOLINE) 50 MG tablet TAKE 1 TABLET BY MOUTH TWICE A DAY 180 tablet 3   levothyroxine (  SYNTHROID) 75 MCG tablet TAKE 1 TABLET BY MOUTH EVERY DAY (Patient taking differently: Take 75 mcg by mouth daily before breakfast.) 90 tablet 3   mirtazapine (REMERON) 7.5 MG tablet TAKE 1 TABLET BY MOUTH EVERYDAY AT BEDTIME 90 tablet 3   Multiple Vitamins-Minerals (MULTIVITAMIN WOMEN 50+) TABS Take 1 tablet by mouth at bedtime.     pantoprazole (PROTONIX) 40 MG tablet Take 1 tablet (40 mg total) by mouth daily. 90 tablet 1   torsemide (DEMADEX) 20 MG tablet TAKE 1 TABLET BY MOUTH TWICE A DAY 180 tablet 0   No current  facility-administered medications for this visit.    ALLERGIES:  Allergies  Allergen Reactions   Norvasc [Amlodipine] Swelling    Edema    Tape Other (See Comments)    Has to have no stick tape.   Zestril [Lisinopril] Cough   Hytrin [Terazosin] Hives and Nausea Only   Imdur [Isosorbide Nitrate] Nausea Only   Sulfonamide Derivatives Nausea And Vomiting and Rash    LABORATORY DATA:  I have reviewed the labs as listed.     Latest Ref Rng & Units 10/29/2023   10:09 AM 10/15/2023   10:54 AM 10/01/2023   10:04 AM  CBC  WBC 4.0 - 10.5 K/uL 2.5  2.4  4.7   Hemoglobin 12.0 - 15.0 g/dL 8.8  9.1  9.4   Hematocrit 36.0 - 46.0 % 28.1  28.9  29.6   Platelets 150 - 400 K/uL 88  105  DCLMP       Latest Ref Rng & Units 08/20/2023    1:00 PM 07/30/2023   12:48 PM 07/15/2023   12:34 PM  CMP  Glucose 70 - 99 mg/dL 401  027  253   BUN 8 - 23 mg/dL 47  45  33   Creatinine 0.44 - 1.00 mg/dL 6.64  4.03  4.74   Sodium 135 - 145 mmol/L 137  134  137   Potassium 3.5 - 5.1 mmol/L 3.9  4.0  4.0   Chloride 98 - 111 mmol/L 94  91  94   CO2 22 - 32 mmol/L 34  33  35   Calcium 8.9 - 10.3 mg/dL 8.9  8.8  8.4   Total Protein 6.5 - 8.1 g/dL 7.1  7.4  6.6   Total Bilirubin 0.0 - 1.2 mg/dL 0.6  0.7  0.8   Alkaline Phos 38 - 126 U/L 35  42  37   AST 15 - 41 U/L 21  29  20    ALT 0 - 44 U/L 13  14  12      DIAGNOSTIC IMAGING:  I have independently reviewed the scans and discussed with the patient. DG Chest 2 View Result Date: 11/06/2023 CLINICAL DATA:  Pneumonia. EXAM: CHEST - 2 VIEW COMPARISON:  X-ray 10/02/2023 and older FINDINGS: Hyperinflation. Enlarged cardiopericardial silhouette. Once again interstitial changes are seen with some vascular prominence. Persistent small right effusion or pleural thickening. No pneumothorax or consolidation. Fullness of the hila bilaterally. Unchanged. Osteopenia. Degenerative changes along the spine. IMPRESSION: Hyperinflation with chronic interstitial lung changes.  Enlarged heart. Small right effusion or pleural thickening. Electronically Signed   By: Karen Kays M.D.   On: 11/06/2023 17:05     WRAP UP:  All questions were answered. The patient knows to call the clinic with any problems, questions or concerns.  Medical decision making: Moderate***  Time spent on visit: I spent *** minutes counseling the patient face to face. The total time spent in  the appointment was *** minutes and more than 50% was on counseling.  Carnella Guadalajara, PA-C  ***

## 2023-11-10 NOTE — Progress Notes (Signed)
 Orders placed per Durenda Hurt, NP note from 08/20/2023.

## 2023-11-11 ENCOUNTER — Inpatient Hospital Stay: Payer: PPO

## 2023-11-11 ENCOUNTER — Inpatient Hospital Stay: Payer: PPO | Admitting: Physician Assistant

## 2023-11-11 VITALS — BP 142/49 | HR 50 | Temp 97.3°F | Resp 17 | Ht 65.0 in | Wt 108.0 lb

## 2023-11-11 DIAGNOSIS — D649 Anemia, unspecified: Secondary | ICD-10-CM

## 2023-11-11 DIAGNOSIS — D696 Thrombocytopenia, unspecified: Secondary | ICD-10-CM

## 2023-11-11 DIAGNOSIS — J849 Interstitial pulmonary disease, unspecified: Secondary | ICD-10-CM

## 2023-11-11 DIAGNOSIS — D61818 Other pancytopenia: Secondary | ICD-10-CM | POA: Diagnosis not present

## 2023-11-11 DIAGNOSIS — J189 Pneumonia, unspecified organism: Secondary | ICD-10-CM

## 2023-11-11 DIAGNOSIS — D631 Anemia in chronic kidney disease: Secondary | ICD-10-CM | POA: Diagnosis not present

## 2023-11-11 DIAGNOSIS — N184 Chronic kidney disease, stage 4 (severe): Secondary | ICD-10-CM

## 2023-11-11 DIAGNOSIS — D500 Iron deficiency anemia secondary to blood loss (chronic): Secondary | ICD-10-CM

## 2023-11-11 DIAGNOSIS — M8580 Other specified disorders of bone density and structure, unspecified site: Secondary | ICD-10-CM

## 2023-11-11 DIAGNOSIS — Z170 Estrogen receptor positive status [ER+]: Secondary | ICD-10-CM

## 2023-11-11 DIAGNOSIS — J449 Chronic obstructive pulmonary disease, unspecified: Secondary | ICD-10-CM | POA: Diagnosis not present

## 2023-11-11 DIAGNOSIS — J9611 Chronic respiratory failure with hypoxia: Secondary | ICD-10-CM

## 2023-11-11 LAB — COMPREHENSIVE METABOLIC PANEL
ALT: 10 U/L (ref 0–44)
AST: 21 U/L (ref 15–41)
Albumin: 3.6 g/dL (ref 3.5–5.0)
Alkaline Phosphatase: 45 U/L (ref 38–126)
Anion gap: 11 (ref 5–15)
BUN: 36 mg/dL — ABNORMAL HIGH (ref 8–23)
CO2: 35 mmol/L — ABNORMAL HIGH (ref 22–32)
Calcium: 9.3 mg/dL (ref 8.9–10.3)
Chloride: 90 mmol/L — ABNORMAL LOW (ref 98–111)
Creatinine, Ser: 1.7 mg/dL — ABNORMAL HIGH (ref 0.44–1.00)
GFR, Estimated: 29 mL/min — ABNORMAL LOW (ref >60)
Glucose, Bld: 163 mg/dL — ABNORMAL HIGH (ref 70–99)
Potassium: 3.5 mmol/L (ref 3.5–5.1)
Sodium: 136 mmol/L (ref 135–145)
Total Bilirubin: 0.5 mg/dL (ref 0.0–1.2)
Total Protein: 7.1 g/dL (ref 6.5–8.1)

## 2023-11-11 LAB — IRON AND TIBC
Iron: 46 ug/dL (ref 28–170)
Saturation Ratios: 18 % (ref 10.4–31.8)
TIBC: 255 ug/dL (ref 250–450)
UIBC: 209 ug/dL

## 2023-11-11 LAB — CBC WITH DIFFERENTIAL/PLATELET
Abs Immature Granulocytes: 0.01 10*3/uL (ref 0.00–0.07)
Basophils Absolute: 0 10*3/uL (ref 0.0–0.1)
Basophils Relative: 1 %
Eosinophils Absolute: 0 10*3/uL (ref 0.0–0.5)
Eosinophils Relative: 1 %
HCT: 29.3 % — ABNORMAL LOW (ref 36.0–46.0)
Hemoglobin: 8.9 g/dL — ABNORMAL LOW (ref 12.0–15.0)
Immature Granulocytes: 0 %
Lymphocytes Relative: 28 %
Lymphs Abs: 0.8 10*3/uL (ref 0.7–4.0)
MCH: 26.6 pg (ref 26.0–34.0)
MCHC: 30.4 g/dL (ref 30.0–36.0)
MCV: 87.7 fL (ref 80.0–100.0)
Monocytes Absolute: 0.3 10*3/uL (ref 0.1–1.0)
Monocytes Relative: 9 %
Neutro Abs: 1.8 10*3/uL (ref 1.7–7.7)
Neutrophils Relative %: 61 %
Platelets: 112 10*3/uL — ABNORMAL LOW (ref 150–400)
RBC: 3.34 MIL/uL — ABNORMAL LOW (ref 3.87–5.11)
RDW: 15.4 % (ref 11.5–15.5)
WBC: 2.9 10*3/uL — ABNORMAL LOW (ref 4.0–10.5)
nRBC: 0 % (ref 0.0–0.2)

## 2023-11-11 LAB — SAMPLE TO BLOOD BANK

## 2023-11-11 LAB — LACTATE DEHYDROGENASE: LDH: 124 U/L (ref 98–192)

## 2023-11-11 LAB — FERRITIN: Ferritin: 261 ng/mL (ref 11–307)

## 2023-11-11 MED ORDER — DENOSUMAB 60 MG/ML ~~LOC~~ SOSY
60.0000 mg | PREFILLED_SYRINGE | Freq: Once | SUBCUTANEOUS | Status: AC
Start: 2023-11-11 — End: 2023-11-11
  Administered 2023-11-11: 60 mg via SUBCUTANEOUS
  Filled 2023-11-11: qty 1

## 2023-11-11 MED ORDER — EPOETIN ALFA-EPBX 40000 UNIT/ML IJ SOLN
40000.0000 [IU] | Freq: Once | INTRAMUSCULAR | Status: AC
  Administered 2023-11-11: 40000 [IU] via SUBCUTANEOUS
  Filled 2023-11-11: qty 1

## 2023-11-11 NOTE — Patient Instructions (Signed)
 [facility] Discharge Instructions  You were seen today by Rojelio Brenner PA-C for your low blood counts.  LOW BLOOD COUNTS:   Your low hemoglobin/red blood cells may be related to your chronic kidney disease.   You may also have some underlying bone marrow dysfunction, such as a condition called MDS ("myelodysplastic syndrome").  That can only be proven with bone marrow biopsy, but you have previously stated that you would like to avoid a bone marrow biopsy. We will continue to give you injections (Retacrit) once every 2 weeks, but will increase the dose to try to improve your blood counts.  This will help your body to make more healthy red blood cells and can be used to treat both anemia from chronic kidney disease and MDS.  IRON: Continue to take iron tablet ONCE daily.  VITAMIN B12:  Continue to take over-the-counter vitamin B12 supplement 500 mcg daily.  FOLLOW-UP APPOINTMENT: Office visit in 3 months with Rojelio Brenner PA-C   ** Thank you for trusting me with your healthcare!  I strive to provide all of my patients with quality care at each visit.  If you receive a survey for this visit, I would be so grateful to you for taking the time to provide feedback.  Thank you in advance!  ~ Kamren Heintzelman                   Dr. Doreatha Massed   &   Rojelio Brenner, PA-C   - - - - - - - - - - - - - - - - - -    Thank you for choosing [facility] at [facility] to provide your oncology and hematology care.  To afford each patient quality time with our provider, please arrive at least 15 minutes before your scheduled appointment time.   If you have a lab appointment with the Cancer Center please come in thru the Main Entrance and check in at the main information desk.  You need to re-schedule your appointment should you arrive 10 or more minutes late.  We strive to give you quality time with our providers, and arriving late  affects you and other patients whose appointments are after yours.  Also, if you no show three or more times for appointments you may be dismissed from the clinic at the providers discretion.     Again, thank you for choosing [facility].  Our hope is that these requests will decrease the amount of time that you wait before being seen by our physicians.       _____________________________________________________________  Should you have questions after your visit to [facility], please contact our office at [phone] and follow the prompts.  Our office hours are 8:00 a.m. and 4:30 p.m. Monday - Friday.  Please note that voicemails left after 4:00 p.m. may not be returned until the following business day.  We are closed weekends and major holidays.  You do have access to a nurse 24-7, just call the main number to the clinic [phone] and do not press any options, hold on the line and a nurse will answer the phone.    For prescription refill requests, have your pharmacy contact our office and allow 72 hours.    Due to Covid, you will need to wear a mask upon entering the hospital. If you do not have a mask, a mask will be given to you  at the Main Entrance upon arrival. For doctor visits, patients may have 1 support person age 71 or older with them. For treatment visits, patients can not have anyone with them due to social distancing guidelines and our immunocompromised population.

## 2023-11-11 NOTE — Progress Notes (Signed)
 Labs reviewed with PA. Blood pressure stable. Patient tolerated  Retacrit and Prolia injection with no complaints voiced.  Site clean and dry with no bruising or swelling noted at site.  See MAR for details.  Band aid applied.  Patient stable during and after injection.  Vss with discharge and left in satisfactory condition with no s/s of distress noted. All follow ups as scheduled.   [name] [employer]

## 2023-11-12 ENCOUNTER — Other Ambulatory Visit: Payer: Self-pay | Admitting: Family Medicine

## 2023-11-12 DIAGNOSIS — I10 Essential (primary) hypertension: Secondary | ICD-10-CM

## 2023-11-12 DIAGNOSIS — D61818 Other pancytopenia: Secondary | ICD-10-CM | POA: Diagnosis not present

## 2023-11-15 ENCOUNTER — Emergency Department (HOSPITAL_COMMUNITY)

## 2023-11-15 ENCOUNTER — Other Ambulatory Visit: Payer: Self-pay

## 2023-11-15 ENCOUNTER — Emergency Department (HOSPITAL_COMMUNITY)
Admission: EM | Admit: 2023-11-15 | Discharge: 2023-11-15 | Disposition: A | Discharge status: Home / Self Care | Attending: Emergency Medicine | Admitting: Emergency Medicine

## 2023-11-15 DIAGNOSIS — R531 Weakness: Secondary | ICD-10-CM | POA: Insufficient documentation

## 2023-11-15 DIAGNOSIS — N189 Chronic kidney disease, unspecified: Secondary | ICD-10-CM | POA: Diagnosis not present

## 2023-11-15 DIAGNOSIS — I130 Hypertensive heart and chronic kidney disease with heart failure and stage 1 through stage 4 chronic kidney disease, or unspecified chronic kidney disease: Secondary | ICD-10-CM | POA: Diagnosis not present

## 2023-11-15 DIAGNOSIS — I509 Heart failure, unspecified: Secondary | ICD-10-CM | POA: Diagnosis not present

## 2023-11-15 DIAGNOSIS — D649 Anemia, unspecified: Secondary | ICD-10-CM

## 2023-11-15 DIAGNOSIS — R918 Other nonspecific abnormal finding of lung field: Secondary | ICD-10-CM | POA: Diagnosis not present

## 2023-11-15 DIAGNOSIS — R062 Wheezing: Secondary | ICD-10-CM | POA: Insufficient documentation

## 2023-11-15 DIAGNOSIS — R7989 Other specified abnormal findings of blood chemistry: Secondary | ICD-10-CM | POA: Diagnosis not present

## 2023-11-15 LAB — TYPE AND SCREEN
ABO/RH(D): A POS
Antibody Screen: NEGATIVE

## 2023-11-15 LAB — CBC WITH DIFFERENTIAL/PLATELET
Abs Immature Granulocytes: 0.01 10*3/uL (ref 0.00–0.07)
Basophils Absolute: 0 10*3/uL (ref 0.0–0.1)
Basophils Relative: 0 %
Eosinophils Absolute: 0 10*3/uL (ref 0.0–0.5)
Eosinophils Relative: 0 %
HCT: 29.4 % — ABNORMAL LOW (ref 36.0–46.0)
Hemoglobin: 9.1 g/dL — ABNORMAL LOW (ref 12.0–15.0)
Immature Granulocytes: 0 %
Lymphocytes Relative: 11 %
Lymphs Abs: 0.5 10*3/uL — ABNORMAL LOW (ref 0.7–4.0)
MCH: 27.5 pg (ref 26.0–34.0)
MCHC: 31 g/dL (ref 30.0–36.0)
MCV: 88.8 fL (ref 80.0–100.0)
Monocytes Absolute: 0.4 10*3/uL (ref 0.1–1.0)
Monocytes Relative: 10 %
Neutro Abs: 3.5 10*3/uL (ref 1.7–7.7)
Neutrophils Relative %: 79 %
Platelets: 107 10*3/uL — ABNORMAL LOW (ref 150–400)
RBC: 3.31 MIL/uL — ABNORMAL LOW (ref 3.87–5.11)
RDW: 16.5 % — ABNORMAL HIGH (ref 11.5–15.5)
WBC: 4.5 10*3/uL (ref 4.0–10.5)
nRBC: 0 % (ref 0.0–0.2)

## 2023-11-15 LAB — COMPREHENSIVE METABOLIC PANEL WITH GFR
ALT: 11 U/L (ref 0–44)
AST: 21 U/L (ref 15–41)
Albumin: 3.8 g/dL (ref 3.5–5.0)
Alkaline Phosphatase: 44 U/L (ref 38–126)
Anion gap: 10 (ref 5–15)
BUN: 36 mg/dL — ABNORMAL HIGH (ref 8–23)
CO2: 32 mmol/L (ref 22–32)
Calcium: 8.3 mg/dL — ABNORMAL LOW (ref 8.9–10.3)
Chloride: 93 mmol/L — ABNORMAL LOW (ref 98–111)
Creatinine, Ser: 1.92 mg/dL — ABNORMAL HIGH (ref 0.44–1.00)
GFR, Estimated: 25 mL/min — ABNORMAL LOW (ref >60)
Glucose, Bld: 112 mg/dL — ABNORMAL HIGH (ref 70–99)
Potassium: 4 mmol/L (ref 3.5–5.1)
Sodium: 135 mmol/L (ref 135–145)
Total Bilirubin: 0.8 mg/dL (ref 0.0–1.2)
Total Protein: 7.5 g/dL (ref 6.5–8.1)

## 2023-11-15 LAB — URINALYSIS, ROUTINE W REFLEX MICROSCOPIC
Bilirubin Urine: NEGATIVE
Glucose, UA: NEGATIVE mg/dL
Hgb urine dipstick: NEGATIVE
Ketones, ur: NEGATIVE mg/dL
Leukocytes,Ua: NEGATIVE
Nitrite: NEGATIVE
Protein, ur: NEGATIVE mg/dL
Specific Gravity, Urine: 1.005 (ref 1.005–1.030)
pH: 6 (ref 5.0–8.0)

## 2023-11-15 LAB — LACTIC ACID, PLASMA
Lactic Acid, Venous: 0.5 mmol/L (ref 0.5–1.9)
Lactic Acid, Venous: 0.5 mmol/L (ref 0.5–1.9)

## 2023-11-15 LAB — TROPONIN I (HIGH SENSITIVITY)
Troponin I (High Sensitivity): 7 ng/L (ref <18)
Troponin I (High Sensitivity): 7 ng/L (ref <18)

## 2023-11-15 LAB — BRAIN NATRIURETIC PEPTIDE: B Natriuretic Peptide: 473 pg/mL — ABNORMAL HIGH (ref 0.0–100.0)

## 2023-11-15 MED ORDER — IPRATROPIUM-ALBUTEROL 0.5-2.5 (3) MG/3ML IN SOLN
3.0000 mL | Freq: Once | RESPIRATORY_TRACT | Status: AC
  Administered 2023-11-15: 3 mL via RESPIRATORY_TRACT
  Filled 2023-11-15: qty 3

## 2023-11-15 NOTE — ED Triage Notes (Signed)
 Pt reports weakness that has been getting worse over the past few day.  Reports her hgb has been getting lower the past few weeks. Wears 2 liters o2 at all times.

## 2023-11-15 NOTE — Discharge Instructions (Signed)
 Follow-up closely with your primary care doctor on an outpatient basis.  Return to emergency department immediately for any new or worsening symptoms.

## 2023-11-15 NOTE — ED Provider Notes (Signed)
 [place] EMERGENCY DEPARTMENT AT [facility] Provider Note   CSN: [id number] Arrival date & time: 11/15/23  1359     History  Chief Complaint  Patient presents with   Weakness    [name] is a 89 y.o. female.  Patient is an 89 year old female who presents to the emergency department with her daughter who notes that the patient has been experiencing worsening weakness over the past 2 days.  Daughter notes that the patient has appeared to be more short of breath as well.  Patient denies any active chest pain, abdominal pain and there is been no associated nausea, vomiting, diarrhea.  Patient has had no associated abnormal rashes.  She has had no associated dizziness, lightheadedness or syncopal events.  Daughter notes that the patient has been experiencing ongoing anemia and is being followed by hematology for this.  She has been receiving iron infusions.   Weakness      Home Medications Prior to Admission medications   Medication Sig Start Date End Date Taking? Authorizing Provider  albuterol (VENTOLIN HFA) 108 (90 Base) MCG/ACT inhaler Inhale 2 puffs into the lungs every 6 (six) hours as needed for wheezing or shortness of breath. 09/01/23   Oretha Milch, MD  atorvastatin (LIPITOR) 20 MG tablet TAKE 1/2 TABLET BY MOUTH DAILY 09/17/23   Delynn Flavin M, DO  bisoprolol (ZEBETA) 5 MG tablet TAKE 1 TABLET (5 MG TOTAL) BY MOUTH DAILY. 11/12/23   [name], DO  feeding supplement (ENSURE ENLIVE / ENSURE PLUS) LIQD Take 237 mLs by mouth 2 (two) times daily between meals. Patient taking differently: Take 237 mLs by mouth daily as needed (decreased appetite). 08/28/22   Catarina Hartshorn, MD  ferrous sulfate 325 (65 FE) MG tablet Take 325 mg by mouth daily with breakfast.    [provider]  flecainide (TAMBOCOR) 50 MG tablet TAKE 1 TABLET BY MOUTH TWICE A DAY 09/16/23   Rollene Rotunda, MD  Fluticasone-Umeclidin-Vilant (TRELEGY ELLIPTA) 100-62.5-25 MCG/ACT  AEPB Inhale 1 puff into the lungs daily. 07/28/23   Oretha Milch, MD  hydrALAZINE (APRESOLINE) 50 MG tablet TAKE 1 TABLET BY MOUTH TWICE A DAY 08/04/23   Delynn Flavin M, DO  levothyroxine (SYNTHROID) 75 MCG tablet TAKE 1 TABLET BY MOUTH EVERY DAY Patient taking differently: Take 75 mcg by mouth daily before breakfast. 01/09/23   Delynn Flavin M, DO  mirtazapine (REMERON) 7.5 MG tablet TAKE 1 TABLET BY MOUTH EVERYDAY AT BEDTIME 02/27/23   Letta Median, PA-C  Multiple Vitamins-Minerals (MULTIVITAMIN WOMEN 50+) TABS Take 1 tablet by mouth at bedtime.    [provider]  pantoprazole (PROTONIX) 40 MG tablet Take 1 tablet (40 mg total) by mouth daily. 07/13/23   Letta Median, PA-C  torsemide (DEMADEX) 20 MG tablet TAKE 1 TABLET BY MOUTH TWICE A DAY 10/23/23   Delynn Flavin M, DO      Allergies    Norvasc [amlodipine], Tape, Zestril [lisinopril], Hytrin [terazosin], Imdur [isosorbide nitrate], and Sulfonamide derivatives    Review of Systems   Review of Systems  Neurological:  Positive for weakness.  All other systems reviewed and are negative.   Physical Exam Updated Vital Signs BP (!) 174/57   Pulse (!) 57   Temp 98.8 F (37.1 C)   Resp 20   Ht 5\' 5"  (1.651 m)   Wt 49.4 kg   SpO2 96%   BMI 18.14 kg/m  Physical Exam Vitals and nursing note reviewed.  Constitutional:  Appearance: Normal appearance.  HENT:     Head: Normocephalic and atraumatic.     Nose: Nose normal.     Mouth/Throat:     Mouth: Mucous membranes are moist.  Eyes:     Extraocular Movements: Extraocular movements intact.     Conjunctiva/sclera: Conjunctivae normal.     Pupils: Pupils are equal, round, and reactive to light.  Cardiovascular:     Rate and Rhythm: Normal rate and regular rhythm.     Pulses: Normal pulses.     Heart sounds: Normal heart sounds.     Comments: Holosystolic murmur Pulmonary:     Effort: Pulmonary effort is normal. No respiratory distress.      Breath sounds: No stridor. Wheezing present. No rhonchi or rales.  Abdominal:     General: Abdomen is flat. Bowel sounds are normal. There is no distension.     Palpations: Abdomen is soft.     Tenderness: There is no abdominal tenderness. There is no guarding.  Musculoskeletal:        General: Normal range of motion.     Cervical back: Normal range of motion and neck supple.     Right lower leg: No edema.     Left lower leg: No edema.  Skin:    General: Skin is warm and dry.     Coloration: Skin is not jaundiced.     Findings: No rash.  Neurological:     General: No focal deficit present.     Mental Status: She is alert and oriented to person, place, and time. Mental status is at baseline.     Cranial Nerves: No cranial nerve deficit.     Sensory: No sensory deficit.     Coordination: Coordination normal.  Psychiatric:        Mood and Affect: Mood normal.        Behavior: Behavior normal.        Thought Content: Thought content normal.        Judgment: Judgment normal.     ED Results / Procedures / Treatments   Labs (all labs ordered are listed, but only abnormal results are displayed) Labs Reviewed  CBC WITH DIFFERENTIAL/PLATELET - Abnormal; Notable for the following components:      Result Value   RBC 3.31 (*)    Hemoglobin 9.1 (*)    HCT 29.4 (*)    RDW 16.5 (*)    Platelets 107 (*)    Lymphs Abs 0.5 (*)    All other components within normal limits  URINALYSIS, ROUTINE W REFLEX MICROSCOPIC - Abnormal; Notable for the following components:   Color, Urine STRAW (*)    All other components within normal limits  CULTURE, BLOOD (ROUTINE X 2)  CULTURE, BLOOD (ROUTINE X 2)  COMPREHENSIVE METABOLIC PANEL WITH GFR  LACTIC ACID, PLASMA  LACTIC ACID, PLASMA  BRAIN NATRIURETIC PEPTIDE  TYPE AND SCREEN  TROPONIN I (HIGH SENSITIVITY)    EKG None  Radiology DG Chest Port 1 View Result Date: 11/15/2023 CLINICAL DATA:  Weakness. EXAM: PORTABLE CHEST 1 VIEW COMPARISON:   11/06/2023. FINDINGS: There are increased interstitial markings throughout bilateral lungs, grossly similar to the prior study. There are probable atelectatic changes at the lung bases. No acute dense consolidation or lung collapse. Bilateral lateral costophrenic angles are clear. Stable cardio-mediastinal silhouette. No acute osseous abnormalities. The soft tissues are within normal limits. IMPRESSION: *No acute cardiopulmonary abnormality. Probable underlying chronic interstitial lung disease. Electronically Signed   By: Timoteo Expose.D.  On: 11/15/2023 14:52    Procedures Procedures    Medications Ordered in ED Medications  ipratropium-albuterol (DUONEB) 0.5-2.5 (3) MG/3ML nebulizer solution 3 mL (3 mLs Nebulization Given 11/15/23 1529)    ED Course/ Medical Decision Making/ A&P                                 Medical Decision Making Amount and/or Complexity of Data Reviewed Labs: ordered. Radiology: ordered.  Risk Prescription drug management.   This patient presents to the ED for concern of generalized weakness differential diagnosis includes sepsis, pneumonia, urinary tract infection, electrolyte derangement, acute kidney injury, CHF, ACS, deconditioning    Additional history obtained:  Additional history obtained from daughter External records from outside source obtained and reviewed including records   Lab Tests:  I Ordered, and personally interpreted labs.  The pertinent results include: Elevated creatinine, at baseline, anemia, at baseline, elevated BNP, at baseline, unremarkable urinalysis, negative lactic acid, normal serial troponins   Imaging Studies ordered:  I ordered imaging studies including chest x-ray I independently visualized and interpreted imaging which showed no acute cardiopulmonary process I agree with the radiologist interpretation   Medicines ordered and prescription drug management:  I ordered medication including DuoNeb for  wheezing Reevaluation of the patient after these medicines showed that the patient improved I have reviewed the patients home medicines and have made adjustments as needed   Problem List / ED Course:  Patient does remain stable at this time for discharge home.  Extensive workup has been performed in the emergency department which demonstrated no acute findings.  Creatinine is at baseline and anemia is at baseline as well.  Patient had a chest x-ray with no acute pulmonary changes.  Patient has no indication for pneumonia.  Patient has stable vital signs with no indication for sepsis.  Urinalysis demonstrates no indication of urinary tract infection.  Abdominal exam is benign with no focal tenderness throughout do not suspect an acute intra-abdominal surgical pathology.  Patient has no concerning rashes.  She has no clinical indication for acute CHF.  Serial troponins were negative and do not suspect ACS.  Suspect deconditioning at this time and daughter notes that she will reach out to the patient's primary care doctor for possible in-home therapy.  Strict return precautions were provided for any new or worsening symptoms.  Patient and daughter voiced understanding and had no additional questions. Patient was fully evaluated by attending physician who is in agreement to plan at this time.    Social Determinants of Health:  None           Final Clinical Impression(s) / ED Diagnoses Final diagnoses:  None    Rx / DC Orders ED Discharge Orders     None         Kathlen Mody 11/15/23 1714    Bethann Berkshire, MD 11/16/23 [phone]

## 2023-11-16 ENCOUNTER — Telehealth: Payer: Self-pay

## 2023-11-16 NOTE — Transitions of Care (Post Inpatient/ED Visit) (Signed)
 11/16/2023  Name: [name] MRN: [id number] DOB: Jul 08, 1934  Today's TOC FU Call Status: Today's TOC FU Call Status:: Successful TOC FU Call Completed TOC FU Call Complete Date: 11/16/23 Patient's Name and Date of Birth confirmed.  Transition Care Management Follow-up Telephone Call Date of Discharge: 11/15/23 [facility] Type of Discharge: Emergency Department Reason for ED Visit: Other: (Weakness) How have you been since you were released from the hospital?: Better Any questions or concerns?: No  Items Reviewed: Did you receive and understand the discharge instructions provided?: Yes Medications obtained,verified, and reconciled?: Yes (Medications Reviewed) Any new allergies since your discharge?: No Dietary orders reviewed?: NA Do you have support at home?: Yes People in Home: child(ren), adult  Medications Reviewed Today: Medications Reviewed Today     Reviewed by Anthoney Harada, LPN (Licensed Practical Nurse) on 11/16/23 at 1019  Med List Status: <None>   Medication Order Taking? Sig Documenting Provider Last Dose Status Informant  albuterol (VENTOLIN HFA) 108 (90 Base) MCG/ACT inhaler [id number] Yes Inhale 2 puffs into the lungs every 6 (six) hours as needed for wheezing or shortness of breath. Oretha Milch, MD Taking Active   atorvastatin (LIPITOR) 20 MG tablet [id number] Yes TAKE 1/2 TABLET BY MOUTH DAILY Delynn Flavin M, DO Taking Active   bisoprolol (ZEBETA) 5 MG tablet [id number] Yes TAKE 1 TABLET (5 MG TOTAL) BY MOUTH DAILY. [name], DO Taking Active   feeding supplement (ENSURE ENLIVE / ENSURE PLUS) LIQD [id number] Yes Take 237 mLs by mouth 2 (two) times daily between meals.  Patient taking differently: Take 237 mLs by mouth daily as needed (decreased appetite).   Catarina Hartshorn, MD Taking Active Self           Med Note (COFFELL, Marzella Schlein   Sun May 03, 2023  3:11 PM)    ferrous sulfate 325 (65 FE) MG tablet [id number] Yes  Take 325 mg by mouth daily with breakfast. [provider] Taking Active Self  flecainide (TAMBOCOR) 50 MG tablet [id number] Yes TAKE 1 TABLET BY MOUTH TWICE A Kenn File, MD Taking Active   Fluticasone-Umeclidin-Vilant (TRELEGY ELLIPTA) 100-62.5-25 MCG/ACT AEPB [id number] Yes Inhale 1 puff into the lungs daily. Oretha Milch, MD Taking Active   hydrALAZINE (APRESOLINE) 50 MG tablet [id number] Yes TAKE 1 TABLET BY MOUTH TWICE A DAY Gottschalk, Ashly M, DO Taking Active   levothyroxine (SYNTHROID) 75 MCG tablet [id number] Yes TAKE 1 TABLET BY MOUTH EVERY DAY  Patient taking differently: Take 75 mcg by mouth daily before breakfast.   [name], DO Taking Active Self  mirtazapine (REMERON) 7.5 MG tablet [id number] Yes TAKE 1 TABLET BY MOUTH EVERYDAY AT BEDTIME Letta Median, PA-C Taking Active Self  Multiple Vitamins-Minerals (MULTIVITAMIN WOMEN 50+) TABS [id number] Yes Take 1 tablet by mouth at bedtime. [provider] Taking Active Self  pantoprazole (PROTONIX) 40 MG tablet [id number] Yes Take 1 tablet (40 mg total) by mouth daily. Letta Median, PA-C Taking Active   torsemide (DEMADEX) 20 MG tablet [id number] Yes TAKE 1 TABLET BY MOUTH TWICE A DAY [name], DO Taking Active             Home Care and Equipment/Supplies: Were Home Health Services Ordered?: NA Any new equipment or medical supplies ordered?: NA  Functional Questionnaire: Do you need assistance with bathing/showering or dressing?: No Do you need assistance with meal preparation?: No Do you need assistance with eating?: No Do you have difficulty  maintaining continence: No Do you need assistance with getting out of bed/getting out of a chair/moving?: No Do you have difficulty managing or taking your medications?: No  Follow up appointments reviewed: PCP Follow-up appointment confirmed?: NA Specialist Hospital Follow-up appointment confirmed?: NA Do you need transportation to  your follow-up appointment?: No Do you understand care options if your condition(s) worsen?: Yes-patient verbalized understanding    SIGNATURE Kandis Fantasia, LPN [facility] Health Advisor [place] [facility] You Are. We Are. One [facility] Direct Dial [phone]

## 2023-11-20 LAB — CULTURE, BLOOD (ROUTINE X 2)
Culture: NO GROWTH
Culture: NO GROWTH
Special Requests: ADEQUATE
Special Requests: ADEQUATE

## 2023-11-25 ENCOUNTER — Inpatient Hospital Stay

## 2023-11-26 ENCOUNTER — Ambulatory Visit (HOSPITAL_BASED_OUTPATIENT_CLINIC_OR_DEPARTMENT_OTHER): Payer: PPO | Admitting: Pulmonary Disease

## 2023-11-26 ENCOUNTER — Inpatient Hospital Stay

## 2023-11-26 ENCOUNTER — Inpatient Hospital Stay: Attending: Hematology

## 2023-11-26 VITALS — BP 175/56 | HR 55 | Temp 98.3°F | Resp 18

## 2023-11-26 DIAGNOSIS — I130 Hypertensive heart and chronic kidney disease with heart failure and stage 1 through stage 4 chronic kidney disease, or unspecified chronic kidney disease: Secondary | ICD-10-CM | POA: Diagnosis not present

## 2023-11-26 DIAGNOSIS — Z170 Estrogen receptor positive status [ER+]: Secondary | ICD-10-CM | POA: Insufficient documentation

## 2023-11-26 DIAGNOSIS — D649 Anemia, unspecified: Secondary | ICD-10-CM

## 2023-11-26 DIAGNOSIS — D631 Anemia in chronic kidney disease: Secondary | ICD-10-CM | POA: Diagnosis not present

## 2023-11-26 DIAGNOSIS — C50511 Malignant neoplasm of lower-outer quadrant of right female breast: Secondary | ICD-10-CM | POA: Insufficient documentation

## 2023-11-26 DIAGNOSIS — Z79899 Other long term (current) drug therapy: Secondary | ICD-10-CM | POA: Diagnosis not present

## 2023-11-26 DIAGNOSIS — N184 Chronic kidney disease, stage 4 (severe): Secondary | ICD-10-CM | POA: Diagnosis not present

## 2023-11-26 DIAGNOSIS — M8580 Other specified disorders of bone density and structure, unspecified site: Secondary | ICD-10-CM

## 2023-11-26 DIAGNOSIS — D509 Iron deficiency anemia, unspecified: Secondary | ICD-10-CM | POA: Diagnosis not present

## 2023-11-26 DIAGNOSIS — E538 Deficiency of other specified B group vitamins: Secondary | ICD-10-CM | POA: Insufficient documentation

## 2023-11-26 DIAGNOSIS — D61818 Other pancytopenia: Secondary | ICD-10-CM | POA: Insufficient documentation

## 2023-11-26 LAB — CBC
HCT: 28.9 % — ABNORMAL LOW (ref 36.0–46.0)
Hemoglobin: 8.9 g/dL — ABNORMAL LOW (ref 12.0–15.0)
MCH: 27.2 pg (ref 26.0–34.0)
MCHC: 30.8 g/dL (ref 30.0–36.0)
MCV: 88.4 fL (ref 80.0–100.0)
Platelets: 105 10*3/uL — ABNORMAL LOW (ref 150–400)
RBC: 3.27 MIL/uL — ABNORMAL LOW (ref 3.87–5.11)
RDW: 16.3 % — ABNORMAL HIGH (ref 11.5–15.5)
WBC: 1.9 10*3/uL — ABNORMAL LOW (ref 4.0–10.5)
nRBC: 0 % (ref 0.0–0.2)

## 2023-11-26 LAB — SAMPLE TO BLOOD BANK

## 2023-11-26 MED ORDER — EPOETIN ALFA-EPBX 40000 UNIT/ML IJ SOLN
40000.0000 [IU] | Freq: Once | INTRAMUSCULAR | Status: AC
  Administered 2023-11-26: 40000 [IU] via SUBCUTANEOUS
  Filled 2023-11-26: qty 1

## 2023-11-26 NOTE — Progress Notes (Signed)
 Patient presents today for Retacrit injection per providers order.  Vital signs within parameters for injection.  Hgb noted to be 8.9.  Patient has no new complaints at this time.  Stable during administration without incident; injection site WNL; see MAR for injection details.  Patient tolerated procedure well and without incident.  No questions or complaints noted at this time.

## 2023-11-26 NOTE — Patient Instructions (Signed)
 [facility] [place] [facility]  Discharge Instructions: Thank you for choosing [facility] to provide your oncology and hematology care.  If you have a lab appointment with the Cancer Center - please note that after April 8th, 2024, all labs will be drawn in the cancer center.  You do not have to check in or register with the main entrance as you have in the past but will complete your check-in in the cancer center.  Wear comfortable clothing and clothing appropriate for easy access to any Portacath or PICC line.   We strive to give you quality time with your provider. You may need to reschedule your appointment if you arrive late (15 or more minutes).  Arriving late affects you and other patients whose appointments are after yours.  Also, if you miss three or more appointments without notifying the office, you may be dismissed from the clinic at the provider's discretion.      For prescription refill requests, have your pharmacy contact our office and allow 72 hours for refills to be completed.    Today you received the following chemotherapy and/or immunotherapy agents Retacrit      To help prevent nausea and vomiting after your treatment, we encourage you to take your nausea medication as directed.  BELOW ARE SYMPTOMS THAT SHOULD BE REPORTED IMMEDIATELY: *FEVER GREATER THAN 100.4 F (38 C) OR HIGHER *CHILLS OR SWEATING *NAUSEA AND VOMITING THAT IS NOT CONTROLLED WITH YOUR NAUSEA MEDICATION *UNUSUAL SHORTNESS OF BREATH *UNUSUAL BRUISING OR BLEEDING *URINARY PROBLEMS (pain or burning when urinating, or frequent urination) *BOWEL PROBLEMS (unusual diarrhea, constipation, pain near the anus) TENDERNESS IN MOUTH AND THROAT WITH OR WITHOUT PRESENCE OF ULCERS (sore throat, sores in mouth, or a toothache) UNUSUAL RASH, SWELLING OR PAIN  UNUSUAL VAGINAL DISCHARGE OR ITCHING   Items with * indicate a potential emergency and should be followed up  as soon as possible or go to the Emergency Department if any problems should occur.  Please show the CHEMOTHERAPY ALERT CARD or IMMUNOTHERAPY ALERT CARD at check-in to the Emergency Department and triage nurse.  Should you have questions after your visit or need to cancel or reschedule your appointment, please contact [facility] [place] - A DEPT OF [facility] [phone]  and follow the prompts.  Office hours are 8:00 a.m. to 4:30 p.m. Monday - Friday. Please note that voicemails left after 4:00 p.m. may not be returned until the following business day.  We are closed weekends and major holidays. You have access to a nurse at all times for urgent questions. Please call the main number to the clinic [phone] and follow the prompts.  For any non-urgent questions, you may also contact your provider using MyChart. We now offer e-Visits for anyone 90 and older to request care online for non-urgent symptoms. For details visit [email or web address].   Also download the MyChart app! Go to the app store, search "MyChart", open the app, select [place], and log in with your MyChart username and password.

## 2023-12-09 ENCOUNTER — Inpatient Hospital Stay

## 2023-12-10 ENCOUNTER — Inpatient Hospital Stay

## 2023-12-10 ENCOUNTER — Ambulatory Visit: Payer: PPO | Admitting: *Deleted

## 2023-12-10 ENCOUNTER — Other Ambulatory Visit: Payer: Self-pay

## 2023-12-10 VITALS — BP 174/58 | HR 51 | Temp 96.6°F | Resp 18

## 2023-12-10 DIAGNOSIS — D649 Anemia, unspecified: Secondary | ICD-10-CM

## 2023-12-10 DIAGNOSIS — D61818 Other pancytopenia: Secondary | ICD-10-CM | POA: Diagnosis not present

## 2023-12-10 DIAGNOSIS — M8580 Other specified disorders of bone density and structure, unspecified site: Secondary | ICD-10-CM

## 2023-12-10 LAB — SAMPLE TO BLOOD BANK

## 2023-12-10 LAB — CBC
HCT: 31.3 % — ABNORMAL LOW (ref 36.0–46.0)
Hemoglobin: 9.5 g/dL — ABNORMAL LOW (ref 12.0–15.0)
MCH: 26.7 pg (ref 26.0–34.0)
MCHC: 30.4 g/dL (ref 30.0–36.0)
MCV: 87.9 fL (ref 80.0–100.0)
Platelets: 105 10*3/uL — ABNORMAL LOW (ref 150–400)
RBC: 3.56 MIL/uL — ABNORMAL LOW (ref 3.87–5.11)
RDW: 16 % — ABNORMAL HIGH (ref 11.5–15.5)
WBC: 2.3 10*3/uL — ABNORMAL LOW (ref 4.0–10.5)
nRBC: 0 % (ref 0.0–0.2)

## 2023-12-10 MED ORDER — EPOETIN ALFA-EPBX 40000 UNIT/ML IJ SOLN
40000.0000 [IU] | Freq: Once | INTRAMUSCULAR | Status: AC
  Administered 2023-12-10: 40000 [IU] via SUBCUTANEOUS
  Filled 2023-12-10: qty 1

## 2023-12-10 NOTE — Patient Instructions (Signed)
 [facility] [place] [facility]  Discharge Instructions: Thank you for choosing [facility] to provide your oncology and hematology care.  If you have a lab appointment with the Cancer Center - please note that after April 8th, 2024, all labs will be drawn in the cancer center.  You do not have to check in or register with the main entrance as you have in the past but will complete your check-in in the cancer center.  Wear comfortable clothing and clothing appropriate for easy access to any Portacath or PICC line.   We strive to give you quality time with your provider. You may need to reschedule your appointment if you arrive late (15 or more minutes).  Arriving late affects you and other patients whose appointments are after yours.  Also, if you miss three or more appointments without notifying the office, you may be dismissed from the clinic at the provider's discretion.      For prescription refill requests, have your pharmacy contact our office and allow 72 hours for refills to be completed.    Today you received the following injection: retacrit   To help prevent nausea and vomiting after your treatment, we encourage you to take your nausea medication as directed.  BELOW ARE SYMPTOMS THAT SHOULD BE REPORTED IMMEDIATELY: *FEVER GREATER THAN 100.4 F (38 C) OR HIGHER *CHILLS OR SWEATING *NAUSEA AND VOMITING THAT IS NOT CONTROLLED WITH YOUR NAUSEA MEDICATION *UNUSUAL SHORTNESS OF BREATH *UNUSUAL BRUISING OR BLEEDING *URINARY PROBLEMS (pain or burning when urinating, or frequent urination) *BOWEL PROBLEMS (unusual diarrhea, constipation, pain near the anus) TENDERNESS IN MOUTH AND THROAT WITH OR WITHOUT PRESENCE OF ULCERS (sore throat, sores in mouth, or a toothache) UNUSUAL RASH, SWELLING OR PAIN  UNUSUAL VAGINAL DISCHARGE OR ITCHING   Items with * indicate a potential emergency and should be followed up as soon as possible or go to the  Emergency Department if any problems should occur.  Please show the CHEMOTHERAPY ALERT CARD or IMMUNOTHERAPY ALERT CARD at check-in to the Emergency Department and triage nurse.  Should you have questions after your visit or need to cancel or reschedule your appointment, please contact [facility] [place] - A DEPT OF [facility] [phone]  and follow the prompts.  Office hours are 8:00 a.m. to 4:30 p.m. Monday - Friday. Please note that voicemails left after 4:00 p.m. may not be returned until the following business day.  We are closed weekends and major holidays. You have access to a nurse at all times for urgent questions. Please call the main number to the clinic [phone] and follow the prompts.  For any non-urgent questions, you may also contact your provider using MyChart. We now offer e-Visits for anyone 29 and older to request care online for non-urgent symptoms. For details visit [email or web address].   Also download the MyChart app! Go to the app store, search "MyChart", open the app, select [place], and log in with your MyChart username and password.

## 2023-12-10 NOTE — Progress Notes (Unsigned)
 Cardiology Office Note:   Date:  12/11/2023  ID:  [name], DOB Jan 14, 1934, MRN [id number] PCP: Eliodoro Guerin, DO  [facility] Providers Cardiologist:  Eilleen Grates, MD {  History of Present Illness:   [name] is a 89 y.o. female with difficult to control with chronic diastolic CHF, and arrhythmias.  She was in the hospital with hypoxic respiratory failure in October 2023.   I reviewed these records for this visit.  She had pneumonia and a CHF exacerbation.   She had mild AKI with her creat being 1.56 at discharge which was down from 1.93.   She was discharged on O2.  She was again in the hospital on January 2024 with pneumonia. She was sent home on 2 L of oxygen  again.  She had some acute on chronic renal insufficiency with a peak creatinine 1.93 and she was mildly hyperkalemic.  He did have follow-up labs yesterday and creatinine is back down to baseline 1.67 and she was not hyperkalemic.  She does have some occasional oxygen .   Since I last saw her she was in the emergency room with just feeling weak.  This was in March.  I reviewed these records.  She does not think she was particularly short of breath.  She had a mildly elevated BNP. The patient denies any new symptoms such as chest discomfort, neck or arm discomfort. There has been no new shortness of breath, PND or orthopnea. There have been no reported palpitations, presyncope or syncope.  She says she is doing okay.  She wears oxygen  around-the-clock.  She is not having any PND or orthopnea.  She has not felt any palpitations.  She is not having chest discomfort.  She has no lower extremity swelling.  She weighs herself daily.  Her blood pressures have been labile but relatively well-controlled.   ROS: As stated in the HPI and negative for all other systems.  Studies Reviewed:    EKG:   NA    Risk Assessment/Calculations:       Physical Exam:   VS:  BP (!) 164/70   Pulse 61   Ht 5\' 5"  (1.651 m)   Wt  110 lb 12.8 oz (50.3 kg)   SpO2 95%   BMI 18.44 kg/m    Wt Readings from Last 3 Encounters:  12/11/23 110 lb 12.8 oz (50.3 kg)  11/15/23 109 lb (49.4 kg)  11/11/23 108 lb (49 kg)     GEN: Well nourished, well developed in no acute distress NECK: No JVD; No carotid bruits CARDIAC: RRR, 3 out of 6 mid-to-late peaking systolic murmur radiating at the aortic outflow tract, no diastolic murmurs, rubs, gallops RESPIRATORY:  Clear to auscultation without rales, wheezing or rhonchi  ABDOMEN: Soft, non-tender, non-distended EXTREMITIES:  No edema; No deformity   ASSESSMENT AND PLAN:   HFpEF:   She seems to be euvolemic.  No change in therapy.  She did have a mildly elevated BNP in March but no overt symptoms related to this.  We talked about as needed dosing of her diuretic.  Atrial tachycardia/PAF:   She has had no symptomatic tachypalpitations.  Despite the possible PAF this would be a heavy burden and she be high risk for anticoagulation so she remains off of DOAC.   HTN : Her blood pressure is labile.  She will continue the meds as listed.  I reviewed her blood pressure diary for this visit.   Thrombocytopenia/leukopenia : She is up-to-date with follow-up  labs.  She actually had labs yesterday and her hemoglobin was stable at 9.5.  That is actually up a little bit.  Her platelets are stable at 105 and white blood cells 2.3.  This has been followed in the past by hematology and her primary provider.   Ao Valve stenosis: This was moderate AS to severe on echo in August 2024.  We had long discussions about this and she is going to have conservative management.  She would not want further imaging or intervention.  MR:   Mild MR. As above.  CKD IIIB:     Creat was 1.9 which was up slightly from previous.  No change in therapy.         Follow up with me in 1 year.  Signed, Eilleen Grates, MD

## 2023-12-10 NOTE — Progress Notes (Unsigned)
 HGB 9.5. Message received from R.Pennington PA to change frequency injections to weekly if HGB is below 9.5. No changes made at this time due to HGB at parameter per provider's instructions.

## 2023-12-11 ENCOUNTER — Ambulatory Visit: Admitting: Cardiology

## 2023-12-11 ENCOUNTER — Encounter: Payer: Self-pay | Admitting: Cardiology

## 2023-12-11 VITALS — BP 164/70 | HR 61 | Ht 65.0 in | Wt 110.8 lb

## 2023-12-11 DIAGNOSIS — N1832 Chronic kidney disease, stage 3b: Secondary | ICD-10-CM | POA: Diagnosis not present

## 2023-12-11 DIAGNOSIS — I10 Essential (primary) hypertension: Secondary | ICD-10-CM

## 2023-12-11 DIAGNOSIS — I5032 Chronic diastolic (congestive) heart failure: Secondary | ICD-10-CM

## 2023-12-11 DIAGNOSIS — I340 Nonrheumatic mitral (valve) insufficiency: Secondary | ICD-10-CM

## 2023-12-11 DIAGNOSIS — I350 Nonrheumatic aortic (valve) stenosis: Secondary | ICD-10-CM

## 2023-12-11 NOTE — Patient Instructions (Signed)
 Medication Instructions:  Your physician recommends that you continue on your current medications as directed. Please refer to the Current Medication list given to you today.  *If you need a refill on your cardiac medications before your next appointment, please call your pharmacy*  Lab Work: NONE   If you have labs (blood work) drawn today and your tests are completely normal, you will receive your results only by: MyChart Message (if you have MyChart) OR A paper copy in the mail If you have any lab test that is abnormal or we need to change your treatment, we will call you to review the results.  Testing/Procedures: NONE   Follow-Up: At [facility], you and your health needs are our priority.  As part of our continuing mission to provide you with exceptional heart care, our providers are all part of one team.  This team includes your primary Cardiologist (physician) and Advanced Practice Providers or APPs (Physician Assistants and Nurse Practitioners) who all work together to provide you with the care you need, when you need it.  Your next appointment:   1 year(s)  Provider:   Eilleen Grates, MD    We recommend signing up for the patient portal called "MyChart".  Sign up information is provided on this After Visit Summary.  MyChart is used to connect with patients for Virtual Visits (Telemedicine).  Patients are able to view lab/test results, encounter notes, upcoming appointments, etc.  Non-urgent messages can be sent to your provider as well.   To learn more about what you can do with MyChart, go to [email or web address].   Other Instructions Thank you for choosing [facility]!

## 2023-12-12 DIAGNOSIS — J449 Chronic obstructive pulmonary disease, unspecified: Secondary | ICD-10-CM | POA: Diagnosis not present

## 2023-12-15 ENCOUNTER — Ambulatory Visit: Payer: Self-pay

## 2023-12-15 NOTE — Telephone Encounter (Signed)
 Copied from CRM [id number]. Topic: Clinical - Red Word Triage >> Dec 15, 2023  9:36 AM [name] wrote: Red Word that prompted transfer to Nurse Triage:  [name] fell in shower a week ago this past Saturday. She is still hurting on right side under arm area.It is real sore and not getting better. She did not go any where to get this checked out.   Chief Complaint: Fall Symptoms: Right sided pain below shoulder to waist  Frequency: Constant  Disposition: [] ED /[] Urgent Care (no appt availability in office) / [x] Appointment(In office/virtual)/ []  [facility]/ [] Home Care/ [] Refused Recommended Disposition /[] [facility]/ []  Follow-up with PCP Additional Notes: Patient states she fell 10 days ago in the shower. She states she was able to catch her self but hit her right side under her shoulder on her shower chair. Patient states that she has had continued pain to that area and would like to come in for an x-ray. Appointment made for tomorrow for evaluation.   Reason for Disposition  [1] MODERATE pain (e.g., interferes with normal activities) AND [2] high-risk adult (e.g., age > 60 years, osteoporosis, chronic steroid use)  Answer Assessment - Initial Assessment Questions 1. MECHANISM: "How did the fall happen?"     Slipped in the shower  2. DOMESTIC VIOLENCE AND ELDER ABUSE SCREENING: "Did you fall because someone pushed you or tried to hurt you?" If Yes, ask: "Are you safe now?"     No 3. ONSET: "When did the fall happen?" (e.g., minutes, hours, or days ago)     1-2 weeks ago  4. LOCATION: "What part of the body hit the ground?" (e.g., back, buttocks, head, hips, knees, hands, head, stomach)     Patient did not hit the ground  5. INJURY: "Did you hurt (injure) yourself when you fell?" If Yes, ask: "What did you injure? Tell me more about this?" (e.g., body area; type of injury; pain severity)"     Injured her right side under her armpit 6. PAIN: "Is there any pain?" If  Yes, ask: "How bad is the pain?" (e.g., Scale 1-10; or mild,  moderate, severe)   - NONE (0): No pain   - MILD (1-3): Doesn't interfere with normal activities    - MODERATE (4-7): Interferes with normal activities or awakens from sleep    - SEVERE (8-10): Excruciating pain, unable to do any normal activities      Mild to moderate  9. OTHER SYMPTOMS: "Do you have any other symptoms?" (e.g., dizziness, fever, weakness; new onset or worsening).      No  10. CAUSE: "What do you think caused the fall (or falling)?" (e.g., tripped, dizzy spell)       Slipped in shower  Answer Assessment - Initial Assessment Questions 1. MECHANISM: "How did the injury happen?"     Fall 2. ONSET: "When did the injury happen?" (Minutes or hours ago)     10 days ago  3. LOCATION: "Where on the chest is the injury located?"     Right side below armpit  4. APPEARANCE: "What does the injury look like?"     Looks normal  5. BLEEDING: "Is there any bleeding now? If Yes, ask: How long has it been bleeding?"     No 6. SEVERITY: "Any difficulty with breathing?"     No 7. SIZE: For cuts, bruises, or swelling, ask: "How large is it?" (e.g., inches or centimeters)     N/A 8. PAIN: "Is there  pain?" If Yes, ask: "How bad is the pain?"   (e.g., Scale 1-10; or mild, moderate, severe)     Mild to moderate  9. TETANUS: For any breaks in the skin, ask: "When was the last tetanus booster?"     N/A  Protocols used: Falls and Falling-A-AH, Chest Injury-A-AH

## 2023-12-15 NOTE — Telephone Encounter (Signed)
 Appointment made. LS

## 2023-12-16 ENCOUNTER — Encounter: Payer: Self-pay | Admitting: Family Medicine

## 2023-12-16 ENCOUNTER — Ambulatory Visit (INDEPENDENT_AMBULATORY_CARE_PROVIDER_SITE_OTHER): Admitting: Family Medicine

## 2023-12-16 ENCOUNTER — Ambulatory Visit (INDEPENDENT_AMBULATORY_CARE_PROVIDER_SITE_OTHER)

## 2023-12-16 ENCOUNTER — Ambulatory Visit

## 2023-12-16 VITALS — BP 177/62 | HR 60 | Temp 97.8°F | Ht 65.0 in | Wt 109.0 lb

## 2023-12-16 DIAGNOSIS — M549 Dorsalgia, unspecified: Secondary | ICD-10-CM

## 2023-12-16 DIAGNOSIS — J9611 Chronic respiratory failure with hypoxia: Secondary | ICD-10-CM | POA: Diagnosis not present

## 2023-12-16 MED ORDER — LIDOCAINE 5 % EX PTCH: 1.0000 | MEDICATED_PATCH | CUTANEOUS | 0 refills | Status: DC

## 2023-12-16 NOTE — Progress Notes (Signed)
 Subjective:  Patient ID: [name], female    DOB: [birth year], 89 y.o.   MRN: [id number]  Patient Care Team: Eliodoro Guerin, DO as PCP - General (Family Medicine) Eilleen Grates, MD as PCP - Cardiology (Cardiology) Colie Dawes, MD as Attending Physician (Radiation Oncology) Sonja Larkspur, MD as Consulting Physician (Hematology) Melodie Spry, MD as Consulting Physician (Nephrology) [facility] Nickola Baron as Physician Assistant (Oncology)   Chief Complaint:  Back Pain (FELL IN SHOWER 2 WEEKS AGO )   HPI: [name] is a 89 y.o. female presenting on 12/16/2023 for Back Pain (FELL IN SHOWER 2 WEEKS AGO )   Back Pain   1. Back pain, unspecified back location, unspecified back pain laterality, unspecified chronicity States that 2 weeks ago she slid in the shower while she was trying to get out of her shower. She slid down and hit her back on the edge of her chair. States that she continues to have pain in her back. She states that when she takes a deep breath she has pain in her right lower back. Using icy hot on site. Using heat. Reports that her pain is at 4/10, increases with deep breath. States that she is limited now due to pain. States that her breathing is at her baseline since her fall.    Of note, admits that she is only using trelegy as needed   Relevant past medical, surgical, family, and social history reviewed and updated as indicated.  Allergies and medications reviewed and updated. Data reviewed: Chart in Epic.   Past Medical History:  Diagnosis Date   Anemia    Arthritis    Asthma    Atrial fibrillation (HCC)    Basal cell carcinoma 02/06/2009   Left ear targus- (MOHS)   Basal cell carcinoma 09/28/2008   Right back-(CX35FU)   CKD (chronic kidney disease), stage III (HCC)    Heart murmur    Hyperlipidemia    Hypertension    Hypothyroidism    Nodule of right lung    Right upper lobe   Osteopenia due to cancer  therapy 10/07/2022   Pneumonia    PONV (postoperative nausea and vomiting)    Renal insufficiency    Chronic   Renal vascular disease    Right renal artery stenosis (HCC) 05/09/2015   Squamous cell carcinoma of skin 09/07/2019   KA-Left shin-txpbx   Vertigo     Past Surgical History:  Procedure Laterality Date   BIOPSY  04/09/2021   Procedure: BIOPSY;  Surgeon: Urban Garden, MD;  [facility];  Service: Gastroenterology;;   BREAST LUMPECTOMY WITH RADIOACTIVE SEED LOCALIZATION Right 04/08/2018   Procedure: BREAST LUMPECTOMY WITH RADIOACTIVE SEED LOCALIZATION;  Surgeon: Enid Harry, MD;  Location[facility] OR;  Service: General;  Laterality: Right;   COLONOSCOPY  2016   diverticulosis, hemorrhoids   ESOPHAGOGASTRODUODENOSCOPY  2016   normal   ESOPHAGOGASTRODUODENOSCOPY (EGD) WITH PROPOFOL  N/A 04/09/2021   Procedure: ESOPHAGOGASTRODUODENOSCOPY (EGD) WITH PROPOFOL ;  Surgeon: Urban Garden, MD;  Location: [facility];  Service: Gastroenterology;  Laterality: N/A;  12:45   ESOPHAGOGASTRODUODENOSCOPY (EGD) WITH PROPOFOL  N/A 09/01/2021   Procedure: ESOPHAGOGASTRODUODENOSCOPY (EGD) WITH PROPOFOL ;  Surgeon: Vinetta Greening, DO;  Location: [facility];  Service: Endoscopy;  Laterality: N/A;   IR GENERIC HISTORICAL  08/29/2016   IR US  GUIDE VASC ACCESS RIGHT 08/29/2016 [facility]   IR GENERIC HISTORICAL  08/29/2016   IR RENAL BILAT  S&I MOD SED 08/29/2016 [facility]   KNEE ARTHROSCOPY Right    2019   THYROIDECTOMY, PARTIAL Right 1981   middle lobe removed 1st, then right lobe removed 7-8 years later (approx. 61)    Social History   Socioeconomic History   Marital status: Widowed    Spouse name: Not on file   Number of children: 2   Years of education: 50   Highest education level: Not on file  Occupational History   Occupation: Retired  Tobacco Use   Smoking status: Never   Smokeless tobacco: Never  Vaping Use   Vaping status: Never  Used  Substance and Sexual Activity   Alcohol  use: No   Drug use: No   Sexual activity: Not Currently  Other Topics Concern   Not on file  Social History Narrative   Lives alone, but her daughter stays there at night   Ambidextrous (writes with left hand).   No caffeine use.   Social Drivers of Corporate investment banker Strain: Low Risk  (12/15/2022)   Overall Financial Resource Strain (CARDIA)    Difficulty of Paying Living Expenses: Not hard at all  Food Insecurity: No Food Insecurity (05/28/2023)   Hunger Vital Sign    Worried About Running Out of Food in the Last Year: Never true    Ran Out of Food in the Last Year: Never true  Transportation Needs: No Transportation Needs (05/28/2023)   PRAPARE - Administrator, Civil Service (Medical): No    Lack of Transportation (Non-Medical): No  Physical Activity: Inactive (12/15/2022)   Exercise Vital Sign    Days of Exercise per Week: 0 days    Minutes of Exercise per Session: 0 min  Stress: No Stress Concern Present (12/15/2022)   [employer] of Occupational Health - Occupational Stress Questionnaire    Feeling of Stress : Not at all  Social Connections: Moderately Isolated (12/15/2022)   Social Connection and Isolation Panel [NHANES]    Frequency of Communication with Friends and Family: More than three times a week    Frequency of Social Gatherings with Friends and Family: Three times a week    Attends Religious Services: More than 4 times per year    Active Member of Clubs or Organizations: No    Attends Banker Meetings: Never    Marital Status: Widowed  Intimate Partner Violence: Not At Risk (05/22/2023)   Humiliation, Afraid, Rape, and Kick questionnaire    Fear of Current or Ex-Partner: No    Emotionally Abused: No    Physically Abused: No    Sexually Abused: No    Outpatient Encounter Medications as of 12/16/2023  Medication Sig   albuterol  (VENTOLIN  HFA) 108 (90 Base) MCG/ACT inhaler  Inhale 2 puffs into the lungs every 6 (six) hours as needed for wheezing or shortness of breath.   atorvastatin  (LIPITOR) 20 MG tablet TAKE 1/2 TABLET BY MOUTH DAILY   bisoprolol  (ZEBETA ) 5 MG tablet TAKE 1 TABLET (5 MG TOTAL) BY MOUTH DAILY.   feeding supplement (ENSURE ENLIVE / ENSURE PLUS) LIQD Take 237 mLs by mouth 2 (two) times daily between meals. (Patient taking differently: Take 237 mLs by mouth daily as needed (decreased appetite).)   ferrous sulfate  325 (65 FE) MG tablet Take 325 mg by mouth daily with breakfast.   flecainide  (TAMBOCOR ) 50 MG tablet TAKE 1 TABLET BY MOUTH TWICE A DAY   Fluticasone -Umeclidin-Vilant (TRELEGY ELLIPTA ) 100-62.5-25 MCG/ACT AEPB Inhale 1 puff into the lungs  daily.   hydrALAZINE  (APRESOLINE ) 50 MG tablet TAKE 1 TABLET BY MOUTH TWICE A DAY   levothyroxine  (SYNTHROID ) 75 MCG tablet TAKE 1 TABLET BY MOUTH EVERY DAY (Patient taking differently: Take 75 mcg by mouth daily before breakfast.)   mirtazapine  (REMERON ) 7.5 MG tablet TAKE 1 TABLET BY MOUTH EVERYDAY AT BEDTIME   Multiple Vitamins-Minerals (MULTIVITAMIN WOMEN 50+) TABS Take 1 tablet by mouth at bedtime.   pantoprazole  (PROTONIX ) 40 MG tablet Take 1 tablet (40 mg total) by mouth daily.   torsemide  (DEMADEX ) 20 MG tablet TAKE 1 TABLET BY MOUTH TWICE A DAY   No facility-administered encounter medications on file as of 12/16/2023.    Allergies  Allergen Reactions   Norvasc  [Amlodipine ] Swelling    Edema    Tape Other (See Comments)    Has to have no stick tape.   Zestril  [Lisinopril ] Cough   Hytrin  [Terazosin ] Hives and Nausea Only   Imdur  [Isosorbide  Nitrate] Nausea Only   Sulfonamide Derivatives Nausea And Vomiting and Rash    Review of Systems  Musculoskeletal:  Positive for back pain.   As per HPI  Objective:  BP (!) 177/62   Pulse 60   Temp 97.8 F (36.6 C) (Temporal)   Ht 5\' 5"  (1.651 m)   Wt 109 lb (49.4 kg)   SpO2 95%   BMI 18.14 kg/m    Wt Readings from Last 3 Encounters:   12/16/23 109 lb (49.4 kg)  12/11/23 110 lb 12.8 oz (50.3 kg)  11/15/23 109 lb (49.4 kg)    Physical Exam Constitutional:      General: She is awake. She is not in acute distress.    Appearance: She is well-developed and underweight. She is ill-appearing. She is not toxic-appearing or diaphoretic.     Comments: Chronically ill appearing   Cardiovascular:     Rate and Rhythm: Normal rate and regular rhythm.     Heart sounds: Murmur heard.     Systolic murmur is present with a grade of 5/6.  Pulmonary:     Breath sounds: Decreased air movement present. Examination of the right-upper field reveals rales. Examination of the left-upper field reveals rales. Examination of the right-middle field reveals rales. Examination of the left-middle field reveals rales. Examination of the right-lower field reveals rales. Examination of the left-lower field reveals rales. Rales present.     Comments: Nasal cannula in place  Musculoskeletal:     Comments: Generalized weakness   Skin:    General: Skin is warm.     Capillary Refill: Capillary refill takes less than 2 seconds.     Findings: Bruising and ecchymosis present.          Comments: Right lower back with bruising, tender to palpation, decreased ROM due to pain   Neurological:     General: No focal deficit present.     Mental Status: She is alert, oriented to person, place, and time and easily aroused. Mental status is at baseline.  Psychiatric:        Attention and Perception: Attention and perception normal.        Mood and Affect: Mood and affect normal.        Speech: Speech normal.        Behavior: Behavior normal. Behavior is cooperative.        Thought Content: Thought content normal.        Results for orders placed or performed in visit on 12/10/23  Sample to Blood Bank   Collection  Time: 12/10/23 10:29 AM  Result Value Ref Range   Blood Bank Specimen SAMPLE AVAILABLE FOR TESTING    Sample Expiration       12/13/2023,2359 Performed at [facility], [address] [place], [place] [address]   CBC   Collection Time: 12/10/23 10:30 AM  Result Value Ref Range   WBC 2.3 (L) 4.0 - 10.5 K/uL   RBC 3.56 (L) 3.87 - 5.11 MIL/uL   Hemoglobin 9.5 (L) 12.0 - 15.0 g/dL   HCT 60.4 (L) 54.0 - 98.1 %   MCV 87.9 80.0 - 100.0 fL   MCH 26.7 26.0 - 34.0 pg   MCHC 30.4 30.0 - 36.0 g/dL   RDW 19.1 (H) 47.8 - 29.5 %   Platelets 105 (L) 150 - 400 K/uL   nRBC 0.0 0.0 - 0.2 %   *Note: Due to a large number of results and/or encounters for the requested time period, some results have not been displayed. A complete set of results can be found in Results Review.       11/06/2023    2:55 PM 10/02/2023    2:19 PM 09/16/2023   11:21 AM 06/12/2023   11:50 AM 05/19/2023    3:02 PM  Depression screen PHQ 2/9  Decreased Interest 0 0 0 0 0  Down, Depressed, Hopeless 0 0 0 0 0  PHQ - 2 Score 0 0 0 0 0  Altered sleeping 0  0 0 0  Tired, decreased energy 0  0 0 0  Change in appetite 0  0 0 0  Feeling bad or failure about yourself  0  0 0 0  Trouble concentrating 0  0 0 0  Moving slowly or fidgety/restless 0  0 0 0  Suicidal thoughts 0  0 0 0  PHQ-9 Score 0  0 0 0  Difficult doing work/chores Not difficult at all  Not difficult at all Not difficult at all Not difficult at all       11/06/2023    2:54 PM 09/16/2023   11:21 AM 06/12/2023   11:50 AM 05/19/2023    3:02 PM  GAD 7 : Generalized Anxiety Score  Nervous, Anxious, on Edge 0 0 0 0  Control/stop worrying 0 0 0 0  Worry too much - different things 0 0 0 0  Trouble relaxing 0 0 0 0  Restless 0 0 0 0  Easily annoyed or irritable 0 0 0 0  Afraid - awful might happen 0 0 0 0  Total GAD 7 Score 0 0 0 0  Anxiety Difficulty Not difficult at all Not difficult at all Not difficult at all Not difficult at all   Pertinent labs & imaging results that were available during my care of the patient were reviewed by me and considered in my medical decision  making.  Assessment & Plan:  Tarry was seen today for back pain.  Diagnoses and all orders for this visit:  Back pain, unspecified back location, unspecified back pain laterality, unspecified chronicity Imaging as below. Will communicate results to patient once available. Will await results to determine next steps.  Will start with tylenol  and lidocaine  as below. Unable to prescribe NSAIDs given cardiac and thrombocytopenia history as well as UGI bleed. She has not had prednisone  in over 3 months, may consider prednisone  burst for pain. Based on age, would like to avoid opioids if possible.  -     DG Lumbar Spine 2-3 Views; Future -  lidocaine  (LIDODERM ) 5 %; Place 1 patch onto the skin daily. Remove & Discard patch within 12 hours or as directed by MD  Chronic respiratory failure with hypoxia (HCC) Encouraged patient to use her trelegy as prescribed. Would like for her to follow up with PCP and pulmonology closely to observe lung function.     Continue all other maintenance medications.  Follow up plan: Return if symptoms worsen or fail to improve.   Continue healthy lifestyle choices, including diet (rich in fruits, vegetables, and lean proteins, and low in salt and simple carbohydrates) and exercise (at least 30 minutes of moderate physical activity daily).  Written and verbal instructions provided   The above assessment and management plan was discussed with the patient. The patient verbalized understanding of and has agreed to the management plan. Patient is aware to call the clinic if they develop any new symptoms or if symptoms persist or worsen. Patient is aware when to return to the clinic for a follow-up visit. Patient educated on when it is appropriate to go to the emergency department.   Jacqualyn Mates, DNP-FNP [facility] [address] [place], [place] [address] [phone]

## 2023-12-17 ENCOUNTER — Encounter: Payer: Self-pay | Admitting: Family Medicine

## 2023-12-17 DIAGNOSIS — M549 Dorsalgia, unspecified: Secondary | ICD-10-CM | POA: Diagnosis not present

## 2023-12-17 DIAGNOSIS — M419 Scoliosis, unspecified: Secondary | ICD-10-CM | POA: Diagnosis not present

## 2023-12-17 DIAGNOSIS — M4316 Spondylolisthesis, lumbar region: Secondary | ICD-10-CM | POA: Diagnosis not present

## 2023-12-17 DIAGNOSIS — M47816 Spondylosis without myelopathy or radiculopathy, lumbar region: Secondary | ICD-10-CM | POA: Diagnosis not present

## 2023-12-17 NOTE — Progress Notes (Signed)
 No acute findings. Mild degenerative changes. Continue current plan. Follow up if symptoms do not improve.

## 2023-12-18 ENCOUNTER — Telehealth: Payer: Self-pay

## 2023-12-18 ENCOUNTER — Ambulatory Visit (INDEPENDENT_AMBULATORY_CARE_PROVIDER_SITE_OTHER): Payer: PPO

## 2023-12-18 ENCOUNTER — Other Ambulatory Visit (HOSPITAL_COMMUNITY): Payer: Self-pay

## 2023-12-18 ENCOUNTER — Encounter: Payer: Self-pay | Admitting: Hematology

## 2023-12-18 VITALS — BP 136/59 | Ht 65.0 in | Wt 109.0 lb

## 2023-12-18 DIAGNOSIS — Z1231 Encounter for screening mammogram for malignant neoplasm of breast: Secondary | ICD-10-CM

## 2023-12-18 DIAGNOSIS — Z0000 Encounter for general adult medical examination without abnormal findings: Secondary | ICD-10-CM

## 2023-12-18 NOTE — Patient Instructions (Signed)
 [name] , Thank you for taking time to come for your Medicare Wellness Visit. I appreciate your ongoing commitment to your health goals. Please review the following plan we discussed and let me know if I can assist you in the future.   Referrals/Orders/Follow-Ups/Clinician Recommendations:    Next Medicare AWV: Monday Dec 19, 2024 at 10:00 am video visit   [x]   An order for a mammogram and/or a bone density scan was placed for you today.        Please call the number below to schedule your appt.        [facility]         Phone: [phone] Make sure to wear two-piece clothing.  If you're having a mammogram, please remember: No lotions,powders, or deodorants the day of the appointment Make sure to bring picture ID and insurance card.  Bring list of medications you are currently taking including any supplements.  If you are having a bone density scan, please discontinue any medications that contain calcium  at least 48 hours (2 days) prior to your scan.   Now you can schedule your mammogram through your mychart! Log into your MyChart account. Go to 'Visit' (or 'Appointments' if on mobile App) --> Schedule an Appointment Under 'Select a Reason for Visit' choose the Mammogram Screening option.      Complete the pre-visit questions and select the time and place that best fits your schedule      Aim for 30 minutes of exercise or brisk walking, 6-8 glasses of water, and 5 servings of fruits and vegetables each day.   This is a list of the screening recommended for you and due dates:  Health Maintenance  Topic Date Due   COVID-19 Vaccine (4 - 2024-25 season) 04/19/2023   Flu Shot  03/18/2024   Mammogram  03/23/2024   Medicare Annual Wellness Visit  12/17/2024   DEXA scan (bone density measurement)  09/15/2025   DTaP/Tdap/Td vaccine (4 - Td or Tdap) 08/04/2027   Pneumonia Vaccine  Completed   Zoster (Shingles) Vaccine  Completed   HPV Vaccine  Aged Out    Meningitis B Vaccine  Aged Out    Advanced directives: (In Chart) A copy of your advanced directives are scanned into your chart should your provider ever need it. Advance Care Planning is important because it:  [x]  Makes sure you receive the medical care that is consistent with your values, goals, and preferences  [x]  It provides guidance to your family and loved ones and it also reduces their decisional burden about whether or not they are making the right decisions based on what you want done  Follow the link provided in your after visit summary or read over the paperwork we have mailed to you to help you started getting your Advance Directives in place. If you need assistance in completing these, please reach out to us  so that we can help you!  Next Medicare Annual Wellness Visit scheduled for next year: yes  Understanding Your Risk for Falls Millions of people have serious injuries from falls each year. It is important to understand your risk of falling. Talk with your health care provider about your risk and what you can do to lower it. If you do have a serious fall, make sure to tell your provider. Falling once raises your risk of falling again. How can falls affect me? Serious injuries from falls are common. These include: Broken bones, such as hip fractures.  Head injuries, such as traumatic brain injuries (TBI) or concussions. A fear of falling can cause you to avoid activities and stay at home. This can make your muscles weaker and raise your risk for a fall. What can increase my risk? There are a number of risk factors that increase your risk for falling. The more risk factors you have, the higher your risk of falling. Serious injuries from a fall happen most often to people who are older than 88 years old. Teenagers and young adults ages 1-29 are also at higher risk. Common risk factors include: Weakness in the lower body. Being generally weak or confused due to long-term  (chronic) illness. Dizziness or balance problems. Poor vision. Medicines that cause dizziness or drowsiness. These may include: Medicines for your blood pressure, heart, anxiety, insomnia, or swelling (edema). Pain medicines. Muscle relaxants. Other risk factors include: Drinking alcohol . Having had a fall in the past. Having foot pain or wearing improper footwear. Working at a dangerous job. Having any of the following in your home: Tripping hazards, such as floor clutter or loose rugs. Poor lighting. Pets. Having dementia or memory loss. What actions can I take to lower my risk of falling?  Physical activity Stay physically fit. Do strength and balance exercises. Consider taking a regular class to build strength and balance. Yoga and tai chi are good options. Vision Have your eyes checked every year and your prescription for glasses or contacts updated as needed. Shoes and walking aids Wear non-skid shoes. Wear shoes that have rubber soles and low heels. Do not wear high heels. Do not walk around the house in socks or slippers. Use a cane or walker as told by your provider. Home safety Attach secure railings on both sides of your stairs. Install grab bars for your bathtub, shower, and toilet. Use a non-skid mat in your bathtub or shower. Attach bath mats securely with double-sided, non-slip rug tape. Use good lighting in all rooms. Keep a flashlight near your bed. Make sure there is a clear path from your bed to the bathroom. Use night-lights. Do not use throw rugs. Make sure all carpeting is taped or tacked down securely. Remove all clutter from walkways and stairways, including extension cords. Repair uneven or broken steps and floors. Avoid walking on icy or slippery surfaces. Walk on the grass instead of on icy or slick sidewalks. Use ice melter to get rid of ice on walkways in the winter. Use a cordless phone. Questions to ask your health care provider Can you help me  check my risk for a fall? Do any of my medicines make me more likely to fall? Should I take a vitamin D  supplement? What exercises can I do to improve my strength and balance? Should I make an appointment to have my vision checked? Do I need a bone density test to check for weak bones (osteoporosis)? Would it help to use a cane or a walker? Where to find more information Centers for Disease Control and Prevention, STEADI: TonerPromos.no Community-Based Fall Prevention Programs: TonerPromos.no [employer] on Aging: BaseRingTones.pl Contact a health care provider if: You fall at home. You are afraid of falling at home. You feel weak, drowsy, or dizzy. This information is not intended to replace advice given to you by your health care provider. Make sure you discuss any questions you have with your health care provider. Document Revised: 04/07/2022 Document Reviewed: 04/07/2022 Elsevier Patient Education  2024 [employer].

## 2023-12-18 NOTE — Telephone Encounter (Signed)
 Pharmacy Patient Advocate Encounter   Received notification from Onbase that prior authorization for Lidocaine  5% patches is required/requested.   Insurance verification completed.   The patient is insured through [name] ADVANTAGE/RX ADVANCE .   Per test claim: PA required; PA submitted to above mentioned insurance via CoverMyMeds Key/confirmation #/EOC [id number] Status is pending

## 2023-12-18 NOTE — Progress Notes (Signed)
 Please attest and cosign this visit due to patients primary care provider not being in the office at the time the visit was completed.   Subjective:   [name] is a 89 y.o. who presents for a Medicare Wellness preventive visit.  Visit Complete: Virtual I connected with  [name] on 12/18/23 by a audio enabled telemedicine application and verified that I am speaking with the correct person using two identifiers.  Patient Location: Home  Provider Location: Home Office  I discussed the limitations of evaluation and management by telemedicine. The patient expressed understanding and agreed to proceed.  Vital Signs: Because this visit was a virtual/telehealth visit, some criteria may be missing or patient reported. Any vitals not documented were not able to be obtained and vitals that have been documented are patient reported.  VideoDeclined- This patient declined Librarian, academic. Therefore the visit was completed with audio only.  Persons Participating in Visit: Patient.  AWV Questionnaire: No: Patient Medicare AWV questionnaire was not completed prior to this visit.  Cardiac Risk Factors include: advanced age (>32men, >67 women);dyslipidemia;hypertension;sedentary lifestyle     Objective:    Today's Vitals   12/18/23 0818  BP: (!) 136/59  Weight: 109 lb (49.4 kg)  Height: 5\' 5"  (1.651 m)  PainSc: 0-No pain   Body mass index is 18.14 kg/m.     12/18/2023    8:24 AM 11/15/2023    2:06 PM 11/11/2023    9:42 AM 10/29/2023   11:23 AM 08/20/2023    1:46 PM 07/30/2023    1:56 PM 06/04/2023   12:58 PM  Advanced Directives  Does Patient Have a Medical Advance Directive? Yes Yes Yes Yes Yes Yes Yes  Type of Estate agent of [place];Living will Healthcare Power of [place];Living will Healthcare Power of [place];Living will;Out of facility DNR (pink MOST or yellow form) Healthcare Power of [employer] of  [place];Out of facility DNR (pink MOST or yellow form) Healthcare Power of [place];Out of facility DNR (pink MOST or yellow form) Healthcare Power of [place];Out of facility DNR (pink MOST or yellow form)  Does patient want to make changes to medical advance directive? No - Patient declined   No - Patient declined No - Patient declined  No - Patient declined  Copy of Healthcare Power of Attorney in Chart? Yes - validated most recent copy scanned in chart (See row information)  No - copy requested No - copy requested No - copy requested No - copy requested No - copy requested  Would patient like information on creating a medical advance directive?   No - Patient declined No - Patient declined No - Patient declined No - Patient declined     Current Medications (verified) Outpatient Encounter Medications as of 12/18/2023  Medication Sig   albuterol  (VENTOLIN  HFA) 108 (90 Base) MCG/ACT inhaler Inhale 2 puffs into the lungs every 6 (six) hours as needed for wheezing or shortness of breath.   atorvastatin  (LIPITOR) 20 MG tablet TAKE 1/2 TABLET BY MOUTH DAILY   bisoprolol  (ZEBETA ) 5 MG tablet TAKE 1 TABLET (5 MG TOTAL) BY MOUTH DAILY.   feeding supplement (ENSURE ENLIVE / ENSURE PLUS) LIQD Take 237 mLs by mouth 2 (two) times daily between meals. (Patient taking differently: Take 237 mLs by mouth daily as needed (decreased appetite).)   ferrous sulfate  325 (65 FE) MG tablet Take 325 mg by mouth daily with breakfast.   flecainide  (TAMBOCOR ) 50 MG tablet TAKE 1 TABLET BY  MOUTH TWICE A DAY   Fluticasone -Umeclidin-Vilant (TRELEGY ELLIPTA ) 100-62.5-25 MCG/ACT AEPB Inhale 1 puff into the lungs daily.   hydrALAZINE  (APRESOLINE ) 50 MG tablet TAKE 1 TABLET BY MOUTH TWICE A DAY   levothyroxine  (SYNTHROID ) 75 MCG tablet TAKE 1 TABLET BY MOUTH EVERY DAY (Patient taking differently: Take 75 mcg by mouth daily before breakfast.)   lidocaine  (LIDODERM ) 5 % Place 1 patch onto the skin daily. Remove & Discard patch within  12 hours or as directed by MD   mirtazapine  (REMERON ) 7.5 MG tablet TAKE 1 TABLET BY MOUTH EVERYDAY AT BEDTIME   Multiple Vitamins-Minerals (MULTIVITAMIN WOMEN 50+) TABS Take 1 tablet by mouth at bedtime.   pantoprazole  (PROTONIX ) 40 MG tablet Take 1 tablet (40 mg total) by mouth daily.   torsemide  (DEMADEX ) 20 MG tablet TAKE 1 TABLET BY MOUTH TWICE A DAY   No facility-administered encounter medications on file as of 12/18/2023.    Allergies (verified) Norvasc  [amlodipine ], Tape, Zestril  [lisinopril ], Hytrin  [terazosin ], Imdur  [isosorbide  nitrate], and Sulfonamide derivatives   History: Past Medical History:  Diagnosis Date   Anemia    Arthritis    Asthma    Atrial fibrillation (HCC)    Basal cell carcinoma 02/06/2009   Left ear targus- (MOHS)   Basal cell carcinoma 09/28/2008   Right back-(CX35FU)   CKD (chronic kidney disease), stage III (HCC)    Heart murmur    Hyperlipidemia    Hypertension    Hypothyroidism    Nodule of right lung    Right upper lobe   Osteopenia due to cancer therapy 10/07/2022   Pneumonia    PONV (postoperative nausea and vomiting)    Renal insufficiency    Chronic   Renal vascular disease    Right renal artery stenosis (HCC) 05/09/2015   Squamous cell carcinoma of skin 09/07/2019   KA-Left shin-txpbx   Vertigo    Past Surgical History:  Procedure Laterality Date   BIOPSY  04/09/2021   Procedure: BIOPSY;  Surgeon: Urban Garden, MD;  [facility];  Service: Gastroenterology;;   BREAST LUMPECTOMY WITH RADIOACTIVE SEED LOCALIZATION Right 04/08/2018   Procedure: BREAST LUMPECTOMY WITH RADIOACTIVE SEED LOCALIZATION;  Surgeon: Enid Harry, MD;  Location: [facility] OR;  Service: General;  Laterality: Right;   COLONOSCOPY  2016   diverticulosis, hemorrhoids   ESOPHAGOGASTRODUODENOSCOPY  2016   normal   ESOPHAGOGASTRODUODENOSCOPY (EGD) WITH PROPOFOL  N/A 04/09/2021   Procedure: ESOPHAGOGASTRODUODENOSCOPY (EGD) WITH PROPOFOL ;   Surgeon: Urban Garden, MD;  [facility];  Service: Gastroenterology;  Laterality: N/A;  12:45   ESOPHAGOGASTRODUODENOSCOPY (EGD) WITH PROPOFOL  N/A 09/01/2021   Procedure: ESOPHAGOGASTRODUODENOSCOPY (EGD) WITH PROPOFOL ;  Surgeon: Vinetta Greening, DO;  [facility];  Service: Endoscopy;  Laterality: N/A;   IR GENERIC HISTORICAL  08/29/2016   IR US  GUIDE VASC ACCESS RIGHT 08/29/2016 [facility]   IR GENERIC HISTORICAL  08/29/2016   IR RENAL BILAT S&I MOD SED 08/29/2016 [facility]   KNEE ARTHROSCOPY Right    2019   THYROIDECTOMY, PARTIAL Right 1981   middle lobe removed 1st, then right lobe removed 7-8 years later (approx. 38)   Family History  Problem Relation Age of Onset   Thyroid  disease Mother    Stroke Father    Hypertension Brother    Lung cancer Maternal Uncle    Cancer Maternal Grandmother        Liver   Lung cancer Maternal Uncle    Hypertension Daughter    Hypercholesterolemia Daughter  Hypercholesterolemia Daughter    Social History   Socioeconomic History   Marital status: Widowed    Spouse name: Not on file   Number of children: 2   Years of education: 78   Highest education level: Not on file  Occupational History   Occupation: Retired  Tobacco Use   Smoking status: Never   Smokeless tobacco: Never  Vaping Use   Vaping status: Never Used  Substance and Sexual Activity   Alcohol  use: No   Drug use: No   Sexual activity: Not Currently  Other Topics Concern   Not on file  Social History Narrative   Lives alone, but her daughter stays there at night   Ambidextrous (writes with left hand).   No caffeine use.   Social Drivers of Corporate investment banker Strain: Low Risk  (12/18/2023)   Overall Financial Resource Strain (CARDIA)    Difficulty of Paying Living Expenses: Not hard at all  Food Insecurity: No Food Insecurity (12/18/2023)   Hunger Vital Sign    Worried About Running Out of Food in the Last Year: Never  true    Ran Out of Food in the Last Year: Never true  Transportation Needs: No Transportation Needs (12/18/2023)   PRAPARE - Administrator, Civil Service (Medical): No    Lack of Transportation (Non-Medical): No  Physical Activity: Inactive (12/18/2023)   Exercise Vital Sign    Days of Exercise per Week: 0 days    Minutes of Exercise per Session: 0 min  Stress: No Stress Concern Present (12/18/2023)   [employer] of Occupational Health - Occupational Stress Questionnaire    Feeling of Stress : Not at all  Social Connections: Socially Isolated (12/18/2023)   Social Connection and Isolation Panel [NHANES]    Frequency of Communication with Friends and Family: More than three times a week    Frequency of Social Gatherings with Friends and Family: More than three times a week    Attends Religious Services: Never    Database administrator or Organizations: No    Attends Banker Meetings: Never    Marital Status: Widowed    Tobacco Counseling Counseling given: Yes    Clinical Intake:  Pre-visit preparation completed: Yes  Pain : No/denies pain Pain Score: 0-No pain     BMI - recorded: 18.14 Nutritional Status: BMI <19  Underweight Nutritional Risks: None Diabetes: No  Lab Results  Component Value Date   HGBA1C 5.0 02/22/2015     How often do you need to have someone help you when you read instructions, pamphlets, or other written materials from your doctor or pharmacy?: 1 - Never  Interpreter Needed?: No  Information entered by :: Sally Crazier CMA   Activities of Daily Living     12/18/2023    8:21 AM 05/22/2023   12:12 PM  In your present state of health, do you have any difficulty performing the following activities:  Hearing? 1 0  Comment has hearing aids however patient says she hardly ever wears them   Vision? 0 0  Difficulty concentrating or making decisions? 0 0  Walking or climbing stairs? 0   Dressing or bathing? 0   Doing errands,  shopping? 0 0  Preparing Food and eating ? N   Using the Toilet? N   In the past six months, have you accidently leaked urine? N   Do you have problems with loss of bowel control? N   Managing your  Medications? N   Managing your Finances? N   Housekeeping or managing your Housekeeping? N     Patient Care Team: Eliodoro Guerin, DO as PCP - General (Family Medicine) Eilleen Grates, MD as PCP - Cardiology (Cardiology) Colie Dawes, MD as Attending Physician (Radiation Oncology) Sonja Tennant, MD as Consulting Physician (Hematology) Melodie Spry, MD as Consulting Physician (Nephrology) [facility] Sonnie Dusky, PA-C as Physician Assistant (Oncology)  Indicate any recent Medical Services you may have received from other than [facility] providers in the past year (date may be approximate).     Assessment:   This is a routine wellness examination for Saima.  Hearing/Vision screen Hearing Screening - Comments:: Patient wears hearing aids. Sees [facility] in [place] Vision Screening - Comments:: Wears rx glasses - up to date with routine eye exams  Patient sees [facility] in [place]    Goals Addressed             This Visit's Progress    Exercise 3x per week (30 min per time)   On track    And careful to prevent falls     Remain active and independent   On track      Depression Screen     12/18/2023    8:25 AM 11/06/2023    2:55 PM 10/02/2023    2:19 PM 09/16/2023   11:21 AM 06/12/2023   11:50 AM 05/19/2023    3:02 PM 04/17/2023    8:13 AM  PHQ 2/9 Scores  PHQ - 2 Score 0 0 0 0 0 0 0  PHQ- 9 Score 0 0  0 0 0 0    Fall Risk     12/18/2023    8:23 AM 12/16/2023    4:32 PM 11/06/2023    2:55 PM 10/02/2023    2:19 PM 09/16/2023   11:21 AM  Fall Risk   Falls in the past year? 1 1 0 0 0  Number falls in past yr: 1 1 0  0  Injury with Fall? 1 1 0  0  Risk for fall due to : History of fall(s);Impaired balance/gait;Orthopedic  patient;Impaired mobility History of fall(s) No Fall Risks  No Fall Risks  Follow up Falls prevention discussed;Falls evaluation completed Falls evaluation completed;Education provided Falls evaluation completed  Falls evaluation completed    MEDICARE RISK AT HOME:  Medicare Risk at Home Any stairs in or around the home?: No If so, are there any without handrails?: No Home free of loose throw rugs in walkways, pet beds, electrical cords, etc?: Yes Adequate lighting in your home to reduce risk of falls?: Yes Life alert?: No Use of a cane, walker or w/c?: No Grab bars in the bathroom?: Yes Shower chair or bench in shower?: Yes Elevated toilet seat or a handicapped toilet?: Yes  TIMED UP AND GO:  Was the test performed?  No  Cognitive Function: 6CIT completed        12/18/2023    8:24 AM 12/15/2022    9:41 AM 04/29/2022    9:05 AM 04/29/2022    9:04 AM 04/26/2021   10:36 AM  6CIT Screen  What Year? 0 points 0 points 0 points 0 points 0 points  What month? 0 points 0 points 0 points 0 points 0 points  What time? 0 points 0 points 0 points 0 points 0 points  Count back from 20 0 points 0 points 0 points  0 points  Months in reverse  0 points 0 points 0 points  0 points  Repeat phrase 0 points 0 points 0 points  2 points  Total Score 0 points 0 points 0 points  2 points    Immunizations Immunization History  Administered Date(s) Administered   Fluad Quad(high Dose 65+) 06/13/2019, 06/01/2020, 06/12/2021, 06/09/2022   Fluad Trivalent(High Dose 65+) 06/12/2023   Influenza, High Dose Seasonal PF 06/01/2017, 06/04/2018   Influenza,inj,Quad PF,6+ Mos 05/31/2013, 05/30/2015, 06/10/2016   Moderna Sars-Covid-2 Vaccination 09/14/2019, 10/12/2019, 06/12/2020   Pneumococcal Conjugate-13 09/07/2013   Pneumococcal Polysaccharide-23 06/10/2016   Td 10/10/2010, 08/03/2017   Tdap 10/10/2010   Zoster Recombinant(Shingrix) 10/17/2020, 02/27/2021   Zoster, Live 05/20/2010    Screening  Tests Health Maintenance  Topic Date Due   COVID-19 Vaccine (4 - 2024-25 season) 04/19/2023   INFLUENZA VACCINE  03/18/2024   MAMMOGRAM  03/23/2024   Medicare Annual Wellness (AWV)  12/17/2024   DEXA SCAN  09/15/2025   DTaP/Tdap/Td (4 - Td or Tdap) 08/04/2027   Pneumonia Vaccine 29+ Years old  Completed   Zoster Vaccines- Shingrix  Completed   HPV VACCINES  Aged Out   Meningococcal B Vaccine  Aged Out    Health Maintenance  Health Maintenance Due  Topic Date Due   COVID-19 Vaccine (4 - 2024-25 season) 04/19/2023   Health Maintenance Items Addressed: Mammogram ordered  Additional Screening:  Vision Screening: Recommended annual ophthalmology exams for early detection of glaucoma and other disorders of the eye.  Dental Screening: Recommended annual dental exams for proper oral hygiene  Community Resource Referral / Chronic Care Management: CRR required this visit?  No   CCM required this visit?  No     Plan:     I have personally reviewed and noted the following in the patient's chart:   Medical and social history Use of alcohol , tobacco or illicit drugs  Current medications and supplements including opioid prescriptions. Patient is not currently taking opioid prescriptions. Functional ability and status Nutritional status Physical activity Advanced directives List of other physicians Hospitalizations, surgeries, and ER visits in previous 12 months Vitals Screenings to include cognitive, depression, and falls Referrals and appointments  In addition, I have reviewed and discussed with patient certain preventive protocols, quality metrics, and best practice recommendations. A written personalized care plan for preventive services as well as general preventive health recommendations were provided to patient.      Kriti Katayama, CMA   12/18/2023   After Visit Summary: (MyChart) Due to this being a telephonic visit, the after visit summary with patients personalized  plan was offered to patient via MyChart   Notes: Nothing significant to report at this time.

## 2023-12-21 NOTE — Telephone Encounter (Signed)
 Patient aware and reports that is okay because Tylenol  is helping.

## 2023-12-21 NOTE — Telephone Encounter (Signed)
 Pharmacy Patient Advocate Encounter  Received notification from [facility] ADVANTAGE/RX ADVANCE that Prior Authorization for  Lidocaine  5% patcheshas been DENIED.  Full denial letter will be uploaded to the media tab. See denial reason below.   PA #/Case ID/Reference #: [id number]

## 2023-12-23 ENCOUNTER — Inpatient Hospital Stay

## 2023-12-23 ENCOUNTER — Inpatient Hospital Stay: Attending: Hematology

## 2023-12-23 ENCOUNTER — Ambulatory Visit (HOSPITAL_COMMUNITY)
Admission: RE | Admit: 2023-12-23 | Discharge: 2023-12-23 | Disposition: A | Discharge status: Home / Self Care | Source: Ambulatory Visit | Attending: Pulmonary Disease | Admitting: Pulmonary Disease

## 2023-12-23 VITALS — BP 138/50 | HR 61 | Temp 97.5°F | Resp 18

## 2023-12-23 DIAGNOSIS — D61818 Other pancytopenia: Secondary | ICD-10-CM | POA: Insufficient documentation

## 2023-12-23 DIAGNOSIS — J9611 Chronic respiratory failure with hypoxia: Secondary | ICD-10-CM | POA: Insufficient documentation

## 2023-12-23 DIAGNOSIS — J189 Pneumonia, unspecified organism: Secondary | ICD-10-CM | POA: Insufficient documentation

## 2023-12-23 DIAGNOSIS — J849 Interstitial pulmonary disease, unspecified: Secondary | ICD-10-CM | POA: Insufficient documentation

## 2023-12-23 DIAGNOSIS — Z79899 Other long term (current) drug therapy: Secondary | ICD-10-CM | POA: Insufficient documentation

## 2023-12-23 DIAGNOSIS — N184 Chronic kidney disease, stage 4 (severe): Secondary | ICD-10-CM | POA: Diagnosis not present

## 2023-12-23 DIAGNOSIS — R911 Solitary pulmonary nodule: Secondary | ICD-10-CM | POA: Diagnosis not present

## 2023-12-23 DIAGNOSIS — E538 Deficiency of other specified B group vitamins: Secondary | ICD-10-CM | POA: Insufficient documentation

## 2023-12-23 DIAGNOSIS — D649 Anemia, unspecified: Secondary | ICD-10-CM

## 2023-12-23 DIAGNOSIS — M8580 Other specified disorders of bone density and structure, unspecified site: Secondary | ICD-10-CM

## 2023-12-23 DIAGNOSIS — D509 Iron deficiency anemia, unspecified: Secondary | ICD-10-CM | POA: Insufficient documentation

## 2023-12-23 DIAGNOSIS — C50511 Malignant neoplasm of lower-outer quadrant of right female breast: Secondary | ICD-10-CM | POA: Insufficient documentation

## 2023-12-23 DIAGNOSIS — I10 Essential (primary) hypertension: Secondary | ICD-10-CM

## 2023-12-23 DIAGNOSIS — I130 Hypertensive heart and chronic kidney disease with heart failure and stage 1 through stage 4 chronic kidney disease, or unspecified chronic kidney disease: Secondary | ICD-10-CM | POA: Insufficient documentation

## 2023-12-23 DIAGNOSIS — D631 Anemia in chronic kidney disease: Secondary | ICD-10-CM | POA: Insufficient documentation

## 2023-12-23 DIAGNOSIS — Z170 Estrogen receptor positive status [ER+]: Secondary | ICD-10-CM | POA: Insufficient documentation

## 2023-12-23 LAB — CBC
HCT: 31.9 % — ABNORMAL LOW (ref 36.0–46.0)
Hemoglobin: 9.6 g/dL — ABNORMAL LOW (ref 12.0–15.0)
MCH: 26 pg (ref 26.0–34.0)
MCHC: 30.1 g/dL (ref 30.0–36.0)
MCV: 86.4 fL (ref 80.0–100.0)
Platelets: 86 10*3/uL — ABNORMAL LOW (ref 150–400)
RBC: 3.69 MIL/uL — ABNORMAL LOW (ref 3.87–5.11)
RDW: 16.3 % — ABNORMAL HIGH (ref 11.5–15.5)
WBC: 2.3 10*3/uL — ABNORMAL LOW (ref 4.0–10.5)
nRBC: 0 % (ref 0.0–0.2)

## 2023-12-23 LAB — SAMPLE TO BLOOD BANK

## 2023-12-23 MED ORDER — EPOETIN ALFA-EPBX 40000 UNIT/ML IJ SOLN
40000.0000 [IU] | Freq: Once | INTRAMUSCULAR | Status: AC
  Administered 2023-12-23: 40000 [IU] via SUBCUTANEOUS
  Filled 2023-12-23: qty 1

## 2023-12-23 MED ORDER — CLONIDINE HCL 0.1 MG PO TABS
0.1000 mg | ORAL_TABLET | Freq: Once | ORAL | Status: AC
  Administered 2023-12-23: 0.1 mg via ORAL
  Filled 2023-12-23: qty 1

## 2023-12-23 NOTE — Progress Notes (Signed)
 Patient presents today for Retacrit  injection. Hemoglobin 9.6 Blood Pressures; 217/66, 196/65, 190/65, 183/62. Sheril Dines PA made aware. 0.1 mg Clonidine  given. BP 30 minutes post medication 115/49. Retacrit  given, BP with manual cuff at 1544 was 138/50.  VSS tolerated without incident or complaint. See MAR for details. Patient stable during and after injection. Patient discharged in satisfactory condition with no s/s of distress noted.

## 2023-12-23 NOTE — Patient Instructions (Addendum)
 [facility] [place] - A DEPT OF [place]  Discharge Instructions: Thank you for choosing [facility] to provide your oncology and hematology care.  If you have a lab appointment with the Cancer Center - please note that after April 8th, 2024, all labs will be drawn in the cancer center.  You do not have to check in or register with the main entrance as you have in the past but will complete your check-in in the cancer center.  Wear comfortable clothing and clothing appropriate for easy access to any Portacath or PICC line.   We strive to give you quality time with your provider. You may need to reschedule your appointment if you arrive late (15 or more minutes).  Arriving late affects you and other patients whose appointments are after yours.  Also, if you miss three or more appointments without notifying the office, you may be dismissed from the clinic at the provider's discretion.      For prescription refill requests, have your pharmacy contact our office and allow 72 hours for refills to be completed.    Today you received the following Blood pressures in office were 217/66, 196/65, 190/65, and 183/62. 0.1 of clonidine  given. Retacrit  also given, return as scheduled.   To help prevent nausea and vomiting after your treatment, we encourage you to take your nausea medication as directed.  BELOW ARE SYMPTOMS THAT SHOULD BE REPORTED IMMEDIATELY: *FEVER GREATER THAN 100.4 F (38 C) OR HIGHER *CHILLS OR SWEATING *NAUSEA AND VOMITING THAT IS NOT CONTROLLED WITH YOUR NAUSEA MEDICATION *UNUSUAL SHORTNESS OF BREATH *UNUSUAL BRUISING OR BLEEDING *URINARY PROBLEMS (pain or burning when urinating, or frequent urination) *BOWEL PROBLEMS (unusual diarrhea, constipation, pain near the anus) TENDERNESS IN MOUTH AND THROAT WITH OR WITHOUT PRESENCE OF ULCERS (sore throat, sores in mouth, or a toothache) UNUSUAL RASH, SWELLING OR PAIN  UNUSUAL VAGINAL DISCHARGE OR  ITCHING   Items with * indicate a potential emergency and should be followed up as soon as possible or go to the Emergency Department if any problems should occur.  Please show the CHEMOTHERAPY ALERT CARD or IMMUNOTHERAPY ALERT CARD at check-in to the Emergency Department and triage nurse.  Should you have questions after your visit or need to cancel or reschedule your appointment, please contact [facility] [place] - A DEPT OF [facility] [phone]  and follow the prompts.  Office hours are 8:00 a.m. to 4:30 p.m. Monday - Friday. Please note that voicemails left after 4:00 p.m. may not be returned until the following business day.  We are closed weekends and major holidays. You have access to a nurse at all times for urgent questions. Please call the main number to the clinic [phone] and follow the prompts.  For any non-urgent questions, you may also contact your provider using MyChart. We now offer e-Visits for anyone 31 and older to request care online for non-urgent symptoms. For details visit [email or web address].   Also download the MyChart app! Go to the app store, search "MyChart", open the app, select [place], and log in with your MyChart username and password.

## 2023-12-24 ENCOUNTER — Inpatient Hospital Stay

## 2023-12-28 ENCOUNTER — Encounter (HOSPITAL_BASED_OUTPATIENT_CLINIC_OR_DEPARTMENT_OTHER): Payer: Self-pay | Admitting: Pulmonary Disease

## 2023-12-29 ENCOUNTER — Ambulatory Visit (HOSPITAL_BASED_OUTPATIENT_CLINIC_OR_DEPARTMENT_OTHER): Payer: Self-pay

## 2023-12-29 NOTE — Progress Notes (Signed)
 Pt daughter Donis Furn[facility]) notified of results

## 2023-12-30 ENCOUNTER — Telehealth: Payer: Self-pay | Admitting: *Deleted

## 2023-12-30 NOTE — Telephone Encounter (Signed)
 Called office requesting a refill on clonidine  0.1 mg Take 1 tablet (0.1 mg total) by mouth as directed. May take one tablet as needed with systolic blood pressure greater than 170. Reports she doesn't take this on a regular basis but keep it on hand as needed. Advised that clonidine  is no longer on her medication profile and message would be sent to provider with this request.

## 2023-12-31 NOTE — Telephone Encounter (Signed)
 Pt calling back for an update on an update on this.

## 2024-01-01 MED ORDER — CLONIDINE HCL 0.1 MG PO TABS: 0.1000 mg | ORAL_TABLET | ORAL | 2 refills | Status: AC

## 2024-01-01 NOTE — Telephone Encounter (Signed)
 Patient informed and verbalized understanding of plan.

## 2024-01-01 NOTE — Telephone Encounter (Signed)
 Per Hochrein, okay to refill clonidine  for prn usage. New prescription sent with updated instructions to [facility].

## 2024-01-01 NOTE — Telephone Encounter (Signed)
 Clonidine  use was last listed and mentioned in July 2024 cardiology note. Cardiology visits after July 2024 doesn't have clonidine  listed or mentioned. Will send back to provider for approval.

## 2024-01-03 ENCOUNTER — Other Ambulatory Visit: Payer: Self-pay | Admitting: Gastroenterology

## 2024-01-03 DIAGNOSIS — R1013 Epigastric pain: Secondary | ICD-10-CM

## 2024-01-06 ENCOUNTER — Inpatient Hospital Stay

## 2024-01-07 ENCOUNTER — Inpatient Hospital Stay

## 2024-01-07 VITALS — BP 181/58 | HR 59 | Resp 16

## 2024-01-07 DIAGNOSIS — N184 Chronic kidney disease, stage 4 (severe): Secondary | ICD-10-CM | POA: Diagnosis not present

## 2024-01-07 DIAGNOSIS — D649 Anemia, unspecified: Secondary | ICD-10-CM

## 2024-01-07 DIAGNOSIS — M8580 Other specified disorders of bone density and structure, unspecified site: Secondary | ICD-10-CM

## 2024-01-07 LAB — CBC
HCT: 32.4 % — ABNORMAL LOW (ref 36.0–46.0)
Hemoglobin: 10.1 g/dL — ABNORMAL LOW (ref 12.0–15.0)
MCH: 26.9 pg (ref 26.0–34.0)
MCHC: 31.2 g/dL (ref 30.0–36.0)
MCV: 86.4 fL (ref 80.0–100.0)
Platelets: 112 10*3/uL — ABNORMAL LOW (ref 150–400)
RBC: 3.75 MIL/uL — ABNORMAL LOW (ref 3.87–5.11)
RDW: 16.8 % — ABNORMAL HIGH (ref 11.5–15.5)
WBC: 3.1 10*3/uL — ABNORMAL LOW (ref 4.0–10.5)
nRBC: 0 % (ref 0.0–0.2)

## 2024-01-07 LAB — SAMPLE TO BLOOD BANK

## 2024-01-07 MED ORDER — EPOETIN ALFA-EPBX 40000 UNIT/ML IJ SOLN
40000.0000 [IU] | Freq: Once | INTRAMUSCULAR | Status: AC
  Administered 2024-01-07: 40000 [IU] via SUBCUTANEOUS
  Filled 2024-01-07: qty 1

## 2024-01-07 NOTE — Progress Notes (Signed)
 Blood pressure elevated today at clinic. Rechecked multiple times. Patient had already took her BP meds prior to getting to clinic today.    Retacrit  injection given per orders. Patient tolerated it well without problems. Vitals stable and discharged home from clinic via wheelchair. Follow up as scheduled.

## 2024-01-08 ENCOUNTER — Other Ambulatory Visit: Payer: Self-pay | Admitting: Family Medicine

## 2024-01-08 DIAGNOSIS — H43813 Vitreous degeneration, bilateral: Secondary | ICD-10-CM | POA: Diagnosis not present

## 2024-01-08 DIAGNOSIS — H11823 Conjunctivochalasis, bilateral: Secondary | ICD-10-CM | POA: Diagnosis not present

## 2024-01-08 DIAGNOSIS — H40033 Anatomical narrow angle, bilateral: Secondary | ICD-10-CM | POA: Diagnosis not present

## 2024-01-08 DIAGNOSIS — H353121 Nonexudative age-related macular degeneration, left eye, early dry stage: Secondary | ICD-10-CM | POA: Diagnosis not present

## 2024-01-08 DIAGNOSIS — H2513 Age-related nuclear cataract, bilateral: Secondary | ICD-10-CM | POA: Diagnosis not present

## 2024-01-11 DIAGNOSIS — J449 Chronic obstructive pulmonary disease, unspecified: Secondary | ICD-10-CM | POA: Diagnosis not present

## 2024-01-14 ENCOUNTER — Emergency Department (HOSPITAL_COMMUNITY)

## 2024-01-14 ENCOUNTER — Other Ambulatory Visit: Payer: Self-pay

## 2024-01-14 ENCOUNTER — Emergency Department (HOSPITAL_COMMUNITY)
Admission: EM | Admit: 2024-01-14 | Discharge: 2024-01-14 | Disposition: A | Discharge status: Home / Self Care | Attending: Emergency Medicine | Admitting: Emergency Medicine

## 2024-01-14 ENCOUNTER — Encounter (HOSPITAL_COMMUNITY): Payer: Self-pay

## 2024-01-14 DIAGNOSIS — I350 Nonrheumatic aortic (valve) stenosis: Secondary | ICD-10-CM | POA: Insufficient documentation

## 2024-01-14 DIAGNOSIS — I509 Heart failure, unspecified: Secondary | ICD-10-CM | POA: Diagnosis not present

## 2024-01-14 DIAGNOSIS — R0989 Other specified symptoms and signs involving the circulatory and respiratory systems: Secondary | ICD-10-CM | POA: Diagnosis not present

## 2024-01-14 DIAGNOSIS — I517 Cardiomegaly: Secondary | ICD-10-CM | POA: Diagnosis not present

## 2024-01-14 DIAGNOSIS — R0602 Shortness of breath: Secondary | ICD-10-CM | POA: Diagnosis not present

## 2024-01-14 DIAGNOSIS — R1084 Generalized abdominal pain: Secondary | ICD-10-CM | POA: Diagnosis not present

## 2024-01-14 DIAGNOSIS — K8020 Calculus of gallbladder without cholecystitis without obstruction: Secondary | ICD-10-CM | POA: Diagnosis not present

## 2024-01-14 DIAGNOSIS — I5020 Unspecified systolic (congestive) heart failure: Secondary | ICD-10-CM | POA: Insufficient documentation

## 2024-01-14 DIAGNOSIS — I110 Hypertensive heart disease with heart failure: Secondary | ICD-10-CM | POA: Diagnosis not present

## 2024-01-14 HISTORY — DX: Chronic obstructive pulmonary disease, unspecified: J44.9

## 2024-01-14 LAB — CBC WITH DIFFERENTIAL/PLATELET
Abs Immature Granulocytes: 0.01 10*3/uL (ref 0.00–0.07)
Basophils Absolute: 0 10*3/uL (ref 0.0–0.1)
Basophils Relative: 0 %
Eosinophils Absolute: 0 10*3/uL (ref 0.0–0.5)
Eosinophils Relative: 1 %
HCT: 33.5 % — ABNORMAL LOW (ref 36.0–46.0)
Hemoglobin: 10.6 g/dL — ABNORMAL LOW (ref 12.0–15.0)
Immature Granulocytes: 0 %
Lymphocytes Relative: 17 %
Lymphs Abs: 0.7 10*3/uL (ref 0.7–4.0)
MCH: 27 pg (ref 26.0–34.0)
MCHC: 31.6 g/dL (ref 30.0–36.0)
MCV: 85.2 fL (ref 80.0–100.0)
Monocytes Absolute: 0.5 10*3/uL (ref 0.1–1.0)
Monocytes Relative: 12 %
Neutro Abs: 2.7 10*3/uL (ref 1.7–7.7)
Neutrophils Relative %: 70 %
Platelets: 89 10*3/uL — ABNORMAL LOW (ref 150–400)
RBC: 3.93 MIL/uL (ref 3.87–5.11)
RDW: 17.5 % — ABNORMAL HIGH (ref 11.5–15.5)
WBC: 3.8 10*3/uL — ABNORMAL LOW (ref 4.0–10.5)
nRBC: 0 % (ref 0.0–0.2)

## 2024-01-14 LAB — RESP PANEL BY RT-PCR (RSV, FLU A&B, COVID)  RVPGX2
Influenza A by PCR: NEGATIVE
Influenza B by PCR: NEGATIVE
Resp Syncytial Virus by PCR: NEGATIVE
SARS Coronavirus 2 by RT PCR: NEGATIVE

## 2024-01-14 LAB — COMPREHENSIVE METABOLIC PANEL WITH GFR
ALT: 11 U/L (ref 0–44)
AST: 19 U/L (ref 15–41)
Albumin: 3.9 g/dL (ref 3.5–5.0)
Alkaline Phosphatase: 46 U/L (ref 38–126)
Anion gap: 14 (ref 5–15)
BUN: 38 mg/dL — ABNORMAL HIGH (ref 8–23)
CO2: 30 mmol/L (ref 22–32)
Calcium: 8.8 mg/dL — ABNORMAL LOW (ref 8.9–10.3)
Chloride: 90 mmol/L — ABNORMAL LOW (ref 98–111)
Creatinine, Ser: 1.7 mg/dL — ABNORMAL HIGH (ref 0.44–1.00)
GFR, Estimated: 29 mL/min — ABNORMAL LOW (ref >60)
Glucose, Bld: 102 mg/dL — ABNORMAL HIGH (ref 70–99)
Potassium: 4.2 mmol/L (ref 3.5–5.1)
Sodium: 134 mmol/L — ABNORMAL LOW (ref 135–145)
Total Bilirubin: 0.8 mg/dL (ref 0.0–1.2)
Total Protein: 7.5 g/dL (ref 6.5–8.1)

## 2024-01-14 LAB — BRAIN NATRIURETIC PEPTIDE: B Natriuretic Peptide: 325 pg/mL — ABNORMAL HIGH (ref 0.0–100.0)

## 2024-01-14 NOTE — Discharge Instructions (Signed)
 Take an extra one of your fluid pills the Demadex  Friday and an extra 1 Saturday.  Follow-up with your family doctor about your weight loss and follow-up with your cardiologist about your shortness of breath

## 2024-01-14 NOTE — ED Triage Notes (Signed)
 Pt arrived via POV from home c/o SOB that began last night. Pt reports dyspnea with exertion. Pt presents on 2L O2 Nasal Cannula at baseline due to COPD. Pt denies CP.

## 2024-01-15 ENCOUNTER — Telehealth: Payer: Self-pay | Admitting: Family Medicine

## 2024-01-15 DIAGNOSIS — Z0279 Encounter for issue of other medical certificate: Secondary | ICD-10-CM

## 2024-01-15 NOTE — Telephone Encounter (Signed)
 Pt grand daughter dropped off FMLA forms to be completed and signed.  Form Fee Paid? (YES)            If NO, form is placed on front office manager desk to hold until payment received. If YES, then form will be placed in the RX/HH Nurse Coordinators box for completion.  Form will not be processed until payment is received

## 2024-01-15 NOTE — ED Provider Notes (Signed)
  EMERGENCY DEPARTMENT AT [facility] Provider Note   CSN: [id number] Arrival date & time: 01/14/24  4098     History  Chief Complaint  Patient presents with   Shortness of Breath    [name] is a 89 y.o. female.  Patient has a history of aortic stenosis and congestive heart failure.  Patient presents with mild dyspnea on exertion.  Patient also states she has been losing weight  The history is provided by the patient and medical records.  Shortness of Breath Severity:  Mild Onset quality:  Sudden Timing:  Intermittent Progression:  Improving Chronicity:  Recurrent Context: activity   Relieved by:  Nothing Worsened by:  Nothing Ineffective treatments:  None tried Associated symptoms: no abdominal pain, no chest pain, no cough, no headaches and no rash        Home Medications Prior to Admission medications   Medication Sig Start Date End Date Taking? Authorizing Provider  albuterol  (VENTOLIN  HFA) 108 (90 Base) MCG/ACT inhaler Inhale 2 puffs into the lungs every 6 (six) hours as needed for wheezing or shortness of breath. 09/01/23   Lind Repine, MD  atorvastatin  (LIPITOR) 20 MG tablet TAKE 1/2 TABLET BY MOUTH DAILY 09/17/23   Vicky Grange M, DO  bisoprolol  (ZEBETA ) 5 MG tablet TAKE 1 TABLET (5 MG TOTAL) BY MOUTH DAILY. 11/12/23   Eliodoro Guerin, DO  cloNIDine  (CATAPRES ) 0.1 MG tablet Take 1 tablet (0.1 mg total) by mouth as directed. Take 1 tablet (0.1 mg total) by mouth daily as needed for systolic blood pressure greater than 180. 01/01/24   Eilleen Grates, MD  feeding supplement (ENSURE ENLIVE / ENSURE PLUS) LIQD Take 237 mLs by mouth 2 (two) times daily between meals. Patient taking differently: Take 237 mLs by mouth daily as needed (decreased appetite). 08/28/22   Demaris Fillers, MD  ferrous sulfate  325 (65 FE) MG tablet Take 325 mg by mouth daily with breakfast.    [provider]  flecainide  (TAMBOCOR ) 50 MG tablet TAKE 1 TABLET  BY MOUTH TWICE A DAY 09/16/23   Eilleen Grates, MD  Fluticasone -Umeclidin-Vilant (TRELEGY ELLIPTA ) 100-62.5-25 MCG/ACT AEPB Inhale 1 puff into the lungs daily. 07/28/23   Lind Repine, MD  hydrALAZINE  (APRESOLINE ) 50 MG tablet TAKE 1 TABLET BY MOUTH TWICE A DAY 08/04/23   Vicky Grange M, DO  levothyroxine  (SYNTHROID ) 75 MCG tablet TAKE 1 TABLET BY MOUTH EVERY DAY Patient taking differently: Take 75 mcg by mouth daily before breakfast. 01/09/23   Vicky Grange M, DO  lidocaine  (LIDODERM ) 5 % Place 1 patch onto the skin daily. Remove & Discard patch within 12 hours or as directed by MD 12/16/23   Chrystine Crate, FNP  mirtazapine  (REMERON ) 7.5 MG tablet TAKE 1 TABLET BY MOUTH EVERYDAY AT BEDTIME 02/27/23   Harper, Kristen S, PA-C  Multiple Vitamins-Minerals (MULTIVITAMIN WOMEN 50+) TABS Take 1 tablet by mouth at bedtime.    [provider]  pantoprazole  (PROTONIX ) 40 MG tablet TAKE 1 TABLET BY MOUTH EVERY DAY 01/08/24   Harper, Kristen S, PA-C  torsemide  (DEMADEX ) 20 MG tablet TAKE 1 TABLET BY MOUTH TWICE A DAY 01/12/24   Eliodoro Guerin, DO      Allergies    Norvasc  [amlodipine ], Tape, Zestril  [lisinopril ], Hytrin  [terazosin ], Imdur  [isosorbide  nitrate], and Sulfonamide derivatives    Review of Systems   Review of Systems  Constitutional:  Negative for appetite change and fatigue.  HENT:  Negative for congestion, ear discharge and sinus  pressure.   Eyes:  Negative for discharge.  Respiratory:  Positive for shortness of breath. Negative for cough.   Cardiovascular:  Negative for chest pain.  Gastrointestinal:  Negative for abdominal pain and diarrhea.  Genitourinary:  Negative for frequency and hematuria.  Musculoskeletal:  Negative for back pain.  Skin:  Negative for rash.  Neurological:  Negative for seizures and headaches.  Psychiatric/Behavioral:  Negative for hallucinations.     Physical Exam Updated Vital Signs BP (!) 171/63   Pulse 60   Temp 98.2 F  (36.8 C) (Oral)   Resp (!) 22   Ht 5\' 5"  (1.651 m)   Wt 47.2 kg   SpO2 97%   BMI 17.31 kg/m  Physical Exam Vitals and nursing note reviewed.  Constitutional:      Appearance: She is well-developed.  HENT:     Head: Normocephalic.     Nose: Nose normal.  Eyes:     General: No scleral icterus.    Conjunctiva/sclera: Conjunctivae normal.  Neck:     Thyroid : No thyromegaly.  Cardiovascular:     Rate and Rhythm: Normal rate and regular rhythm.     Heart sounds: No murmur heard.    No friction rub. No gallop.  Pulmonary:     Breath sounds: No stridor. No wheezing or rales.  Chest:     Chest wall: No tenderness.  Abdominal:     General: There is no distension.     Tenderness: There is no abdominal tenderness. There is no rebound.  Musculoskeletal:        General: Normal range of motion.     Cervical back: Neck supple.  Lymphadenopathy:     Cervical: No cervical adenopathy.  Skin:    Findings: No erythema or rash.  Neurological:     Mental Status: She is alert and oriented to person, place, and time.     Motor: No abnormal muscle tone.     Coordination: Coordination normal.  Psychiatric:        Behavior: Behavior normal.     ED Results / Procedures / Treatments   Labs (all labs ordered are listed, but only abnormal results are displayed) Labs Reviewed  CBC WITH DIFFERENTIAL/PLATELET - Abnormal; Notable for the following components:      Result Value   WBC 3.8 (*)    Hemoglobin 10.6 (*)    HCT 33.5 (*)    RDW 17.5 (*)    Platelets 89 (*)    All other components within normal limits  COMPREHENSIVE METABOLIC PANEL WITH GFR - Abnormal; Notable for the following components:   Sodium 134 (*)    Chloride 90 (*)    Glucose, Bld 102 (*)    BUN 38 (*)    Creatinine, Ser 1.70 (*)    Calcium  8.8 (*)    GFR, Estimated 29 (*)    All other components within normal limits  BRAIN NATRIURETIC PEPTIDE - Abnormal; Notable for the following components:   B Natriuretic Peptide  325.0 (*)    All other components within normal limits  RESP PANEL BY RT-PCR (RSV, FLU A&B, COVID)  [id number]    EKG None  Radiology CT ABDOMEN PELVIS WO CONTRAST Result Date: 01/14/2024 CLINICAL DATA:  Generalized abdominal pain.  Nausea.  Anorexia. EXAM: CT ABDOMEN AND PELVIS WITHOUT CONTRAST TECHNIQUE: Multidetector CT imaging of the abdomen and pelvis was performed following the standard protocol without IV contrast. RADIATION DOSE REDUCTION: This exam was performed according to the departmental dose-optimization program which  includes automated exposure control, adjustment of the mA and/or kV according to patient size and/or use of iterative reconstruction technique. COMPARISON:  01/16/2021 FINDINGS: Lower chest: Stable cardiomegaly. New tiny right pleural effusion and bibasilar interstitial prominence, which may be due to interstitial edema/congestive heart failure. Hepatobiliary: No mass visualized on this unenhanced exam. Small gallstones are seen, but without signs of cholecystitis or biliary dilatation. Pancreas: No mass or inflammatory process visualized on this unenhanced exam. Spleen:  Within normal limits in size. Adrenals/Urinary tract: No evidence of urolithiasis or hydronephrosis. Unremarkable unopacified urinary bladder. Stomach/Bowel: No evidence of obstruction, inflammatory process, or abnormal fluid collections. Vascular/Lymphatic: No pathologically enlarged lymph nodes identified. No evidence of abdominal aortic aneurysm. Reproductive:  No mass or other significant abnormality. Other:  None. Musculoskeletal:  No suspicious bone lesions identified. IMPRESSION: No acute findings within the abdomen or pelvis. Cholelithiasis. No radiographic evidence of cholecystitis. Stable cardiomegaly. New tiny right pleural effusion and bibasilar interstitial prominence, which may be due to mild interstitial edema/congestive heart failure. Electronically Signed   By: Marlyce Sine M.D.   On: 01/14/2024  19:03   DG Chest 2 View Result Date: 01/14/2024 CLINICAL DATA:  Shortness of breath. EXAM: CHEST - 2 VIEW COMPARISON:  11/15/2023 FINDINGS: Moderate cardiomegaly and pulmonary vascular congestion are noted. No No evidence of frank pulmonary edema or focal consolidation. No pleural effusion. IMPRESSION: Moderate cardiomegaly and pulmonary vascular congestion. No acute findings. Electronically Signed   By: Marlyce Sine M.D.   On: 01/14/2024 18:58    Procedures Procedures    Medications Ordered in ED Medications - No data to display  ED Course/ Medical Decision Making/ A&P                                 Medical Decision Making Amount and/or Complexity of Data Reviewed Labs: ordered. Radiology: ordered. ECG/medicine tests: ordered.   Patient with shortness of breath and mild worsening congestive heart failure.  She will take an extra Demadex  a day for 2 days and follow-up with her heart doctor.  Patient also with losing weight.  CT abdomen unremarkable.  Patient will follow-up with PCP about this        Final Clinical Impression(s) / ED Diagnoses Final diagnoses:  Systolic congestive heart failure, unspecified HF chronicity (HCC)  Nonrheumatic aortic valve stenosis    Rx / DC Orders ED Discharge Orders     None         Cheyenne Cotta, MD 01/15/24 1155

## 2024-01-18 ENCOUNTER — Encounter: Payer: Self-pay | Admitting: Family Medicine

## 2024-01-18 ENCOUNTER — Ambulatory Visit (INDEPENDENT_AMBULATORY_CARE_PROVIDER_SITE_OTHER): Payer: PPO | Admitting: Family Medicine

## 2024-01-18 ENCOUNTER — Other Ambulatory Visit: Payer: Self-pay | Admitting: Family Medicine

## 2024-01-18 VITALS — BP 143/76 | HR 60 | Temp 98.3°F | Ht 65.0 in | Wt 106.0 lb

## 2024-01-18 DIAGNOSIS — N184 Chronic kidney disease, stage 4 (severe): Secondary | ICD-10-CM | POA: Diagnosis not present

## 2024-01-18 DIAGNOSIS — J9611 Chronic respiratory failure with hypoxia: Secondary | ICD-10-CM | POA: Diagnosis not present

## 2024-01-18 DIAGNOSIS — E034 Atrophy of thyroid (acquired): Secondary | ICD-10-CM

## 2024-01-18 DIAGNOSIS — I5032 Chronic diastolic (congestive) heart failure: Secondary | ICD-10-CM

## 2024-01-18 NOTE — Progress Notes (Signed)
 Subjective: CC:ER follow up PCP: Eliodoro Guerin, DO ION:GEXBM B Chesnut is a 89 y.o. female presenting to clinic today for:  1. ER follow up for SOB Advised to increased demadex  for 1-2 days and follow up with cardiology.  Patient has not yet reached out to them for appt.  She also complained of weight loss.  She notes that over the weekend she forced herself to eat more food and her weight is up slightly today.  She reports no lower extremity edema and in fact had no edema when she had the increased work of breathing.  However, after use of diuretics she does note that her breathing went back to normal and she is back on 2 L via nasal cannula and this is her baseline.  Her daughter is not present for today's visit but does bring her back and forth every other week for her other visits with hematology.  Needs FMLA paperwork filled out for that.  She has not reached out to her cardiologist because symptoms were better and she was not sure what they would do for her   ROS: Per HPI  Allergies  Allergen Reactions   Norvasc  [Amlodipine ] Swelling    Edema    Tape Other (See Comments)    Has to have no stick tape.   Zestril  [Lisinopril ] Cough   Hytrin  [Terazosin ] Hives and Nausea Only   Imdur  [Isosorbide  Nitrate] Nausea Only   Sulfonamide Derivatives Nausea And Vomiting and Rash   Past Medical History:  Diagnosis Date   Anemia    Arthritis    Asthma    Atrial fibrillation (HCC)    Basal cell carcinoma 02/06/2009   Left ear targus- (MOHS)   Basal cell carcinoma 09/28/2008   Right back-([id number])   CKD (chronic kidney disease), stage III (HCC)    COPD (chronic obstructive pulmonary disease) (HCC)    Heart murmur    Hyperlipidemia    Hypertension    Hypothyroidism    Nodule of right lung    Right upper lobe   Osteopenia due to cancer therapy 10/07/2022   Pneumonia    PONV (postoperative nausea and vomiting)    Renal insufficiency    Chronic   Renal vascular disease    Right  renal artery stenosis (HCC) 05/09/2015   Squamous cell carcinoma of skin 09/07/2019   KA-Left shin-txpbx   Vertigo     Current Outpatient Medications:    albuterol  (VENTOLIN  HFA) 108 (90 Base) MCG/ACT inhaler, Inhale 2 puffs into the lungs every 6 (six) hours as needed for wheezing or shortness of breath., Disp: 8 g, Rfl: 6   atorvastatin  (LIPITOR) 20 MG tablet, TAKE 1/2 TABLET BY MOUTH DAILY, Disp: 45 tablet, Rfl: 1   bisoprolol  (ZEBETA ) 5 MG tablet, TAKE 1 TABLET (5 MG TOTAL) BY MOUTH DAILY., Disp: 90 tablet, Rfl: 0   cloNIDine  (CATAPRES ) 0.1 MG tablet, Take 1 tablet (0.1 mg total) by mouth as directed. Take 1 tablet (0.1 mg total) by mouth daily as needed for systolic blood pressure greater than 180., Disp: 20 tablet, Rfl: 2   feeding supplement (ENSURE ENLIVE / ENSURE PLUS) LIQD, Take 237 mLs by mouth 2 (two) times daily between meals. (Patient taking differently: Take 237 mLs by mouth daily as needed (decreased appetite).), Disp: 237 mL, Rfl: 12   ferrous sulfate  325 (65 FE) MG tablet, Take 325 mg by mouth daily with breakfast., Disp: , Rfl:    flecainide  (TAMBOCOR ) 50 MG tablet, TAKE 1 TABLET BY MOUTH  TWICE A DAY, Disp: 180 tablet, Rfl: 2   Fluticasone -Umeclidin-Vilant (TRELEGY ELLIPTA ) 100-62.5-25 MCG/ACT AEPB, Inhale 1 puff into the lungs daily., Disp: , Rfl:    hydrALAZINE  (APRESOLINE ) 50 MG tablet, TAKE 1 TABLET BY MOUTH TWICE A DAY, Disp: 180 tablet, Rfl: 3   levothyroxine  (SYNTHROID ) 75 MCG tablet, TAKE 1 TABLET BY MOUTH EVERY DAY (Patient taking differently: Take 75 mcg by mouth daily before breakfast.), Disp: 90 tablet, Rfl: 3   lidocaine  (LIDODERM ) 5 %, Place 1 patch onto the skin daily. Remove & Discard patch within 12 hours or as directed by MD, Disp: 30 patch, Rfl: 0   mirtazapine  (REMERON ) 7.5 MG tablet, TAKE 1 TABLET BY MOUTH EVERYDAY AT BEDTIME, Disp: 90 tablet, Rfl: 3   Multiple Vitamins-Minerals (MULTIVITAMIN WOMEN 50+) TABS, Take 1 tablet by mouth at bedtime., Disp: , Rfl:     pantoprazole  (PROTONIX ) 40 MG tablet, TAKE 1 TABLET BY MOUTH EVERY DAY, Disp: 90 tablet, Rfl: 1   torsemide  (DEMADEX ) 20 MG tablet, TAKE 1 TABLET BY MOUTH TWICE A DAY, Disp: 180 tablet, Rfl: 0 Social History   Socioeconomic History   Marital status: Widowed    Spouse name: Not on file   Number of children: 2   Years of education: 29   Highest education level: Not on file  Occupational History   Occupation: Retired  Tobacco Use   Smoking status: Never   Smokeless tobacco: Never  Vaping Use   Vaping status: Never Used  Substance and Sexual Activity   Alcohol  use: No   Drug use: No   Sexual activity: Not Currently  Other Topics Concern   Not on file  Social History Narrative   Lives alone, but her daughter stays there at night   Ambidextrous (writes with left hand).   No caffeine use.   Social Drivers of Corporate investment banker Strain: Low Risk  (12/18/2023)   Overall Financial Resource Strain (CARDIA)    Difficulty of Paying Living Expenses: Not hard at all  Food Insecurity: No Food Insecurity (12/18/2023)   Hunger Vital Sign    Worried About Running Out of Food in the Last Year: Never true    Ran Out of Food in the Last Year: Never true  Transportation Needs: No Transportation Needs (12/18/2023)   PRAPARE - Administrator, Civil Service (Medical): No    Lack of Transportation (Non-Medical): No  Physical Activity: Inactive (12/18/2023)   Exercise Vital Sign    Days of Exercise per Week: 0 days    Minutes of Exercise per Session: 0 min  Stress: No Stress Concern Present (12/18/2023)   [employer] of Occupational Health - Occupational Stress Questionnaire    Feeling of Stress : Not at all  Social Connections: Socially Isolated (12/18/2023)   Social Connection and Isolation Panel [NHANES]    Frequency of Communication with Friends and Family: More than three times a week    Frequency of Social Gatherings with Friends and Family: More than three times a week     Attends Religious Services: Never    Database administrator or Organizations: No    Attends Banker Meetings: Never    Marital Status: Widowed  Intimate Partner Violence: Not At Risk (12/18/2023)   Humiliation, Afraid, Rape, and Kick questionnaire    Fear of Current or Ex-Partner: No    Emotionally Abused: No    Physically Abused: No    Sexually Abused: No   Family History  Problem  Relation Age of Onset   Thyroid  disease Mother    Stroke Father    Hypertension Brother    Lung cancer Maternal Uncle    Cancer Maternal Grandmother        Liver   Lung cancer Maternal Uncle    Hypertension Daughter    Hypercholesterolemia Daughter    Hypercholesterolemia Daughter     Objective: Office vital signs reviewed. BP (!) 143/76   Pulse 60   Temp 98.3 F (36.8 C)   Ht 5\' 5"  (1.651 m)   Wt 106 lb (48.1 kg)   SpO2 96%   BMI 17.64 kg/m   Physical Examination:  General: Awake, alert, thin elderly female, No acute distress HEENT: Moist mucous membranes Cardio: regular rate and rhythm, S1S2 heard,  murmurs appreciated Pulm: Globally decreased breath sounds with normal work of breathing on 2 L of oxygen  via nasal cannula Extremities: No edema  Assessment/ Plan: 89 y.o. female   Chronic respiratory failure with hypoxia (HCC) - Plan: Basic Metabolic Panel  Stage 4 chronic kidney disease (HCC) - Plan: Basic Metabolic Panel  Chronic diastolic heart failure (HCC) - Plan: Basic Metabolic Panel  I reviewed her ER notes.  I will CC her cardiologist as FYI.  Check renal function given increased use of diuretic.  Clinically appears to be euvolemic and oxygen  saturation is normal.  I completed her oxygen  and her daughter's FMLA paperwork today.  Encouraged ongoing use of meal supplement/ replacement shake, regular scheduled meals.   Eliodoro Guerin, DO [facility] [phone]

## 2024-01-18 NOTE — Telephone Encounter (Signed)
 PCP completed and signed FMLA forms. They have been faxed to [facility] at fax number [phone]. Patient has been contacted and informed they are complete. Copy at front desk.

## 2024-01-18 NOTE — Patient Instructions (Addendum)
 Ensure is ok and should be ok at your level of kidney disease.  Nepro is a nutritional shake MADE for people that have kidney disease.

## 2024-01-20 ENCOUNTER — Inpatient Hospital Stay

## 2024-01-21 ENCOUNTER — Inpatient Hospital Stay

## 2024-01-21 ENCOUNTER — Inpatient Hospital Stay: Attending: Hematology

## 2024-01-21 VITALS — BP 167/51 | HR 57 | Temp 97.2°F | Resp 18

## 2024-01-21 DIAGNOSIS — D631 Anemia in chronic kidney disease: Secondary | ICD-10-CM | POA: Insufficient documentation

## 2024-01-21 DIAGNOSIS — M8580 Other specified disorders of bone density and structure, unspecified site: Secondary | ICD-10-CM

## 2024-01-21 DIAGNOSIS — N184 Chronic kidney disease, stage 4 (severe): Secondary | ICD-10-CM | POA: Diagnosis not present

## 2024-01-21 LAB — CBC
HCT: 34.1 % — ABNORMAL LOW (ref 36.0–46.0)
Hemoglobin: 10.8 g/dL — ABNORMAL LOW (ref 12.0–15.0)
MCH: 26.5 pg (ref 26.0–34.0)
MCHC: 31.7 g/dL (ref 30.0–36.0)
MCV: 83.8 fL (ref 80.0–100.0)
Platelets: 116 10*3/uL — ABNORMAL LOW (ref 150–400)
RBC: 4.07 MIL/uL (ref 3.87–5.11)
RDW: 16.8 % — ABNORMAL HIGH (ref 11.5–15.5)
WBC: 2.2 10*3/uL — ABNORMAL LOW (ref 4.0–10.5)
nRBC: 0 % (ref 0.0–0.2)

## 2024-01-21 LAB — SAMPLE TO BLOOD BANK

## 2024-01-21 MED ORDER — EPOETIN ALFA-EPBX 40000 UNIT/ML IJ SOLN
40000.0000 [IU] | Freq: Once | INTRAMUSCULAR | Status: AC
  Administered 2024-01-21: 40000 [IU] via SUBCUTANEOUS
  Filled 2024-01-21: qty 1

## 2024-01-21 NOTE — Progress Notes (Signed)
 [name] presents today for Retacrit  injection per the provider's orders.  Stable during administration without incident; injection site WNL; see MAR for injection details.  Patient tolerated procedure well and without incident.  No questions or complaints noted at this time.

## 2024-01-21 NOTE — Patient Instructions (Signed)
 [facility] [place] - A DEPT OF [facility]  Discharge Instructions: Thank you for choosing [facility] to provide your oncology and hematology care.  If you have a lab appointment with the Cancer Center - please note that after April 8th, 2024, all labs will be drawn in the cancer center.  You do not have to check in or register with the main entrance as you have in the past but will complete your check-in in the cancer center.  Wear comfortable clothing and clothing appropriate for easy access to any Portacath or PICC line.   We strive to give you quality time with your provider. You may need to reschedule your appointment if you arrive late (15 or more minutes).  Arriving late affects you and other patients whose appointments are after yours.  Also, if you miss three or more appointments without notifying the office, you may be dismissed from the clinic at the provider's discretion.      For prescription refill requests, have your pharmacy contact our office and allow 72 hours for refills to be completed.    Today you received the following chemotherapy and/or immunotherapy agents retacrit      To help prevent nausea and vomiting after your treatment, we encourage you to take your nausea medication as directed.  BELOW ARE SYMPTOMS THAT SHOULD BE REPORTED IMMEDIATELY: *FEVER GREATER THAN 100.4 F (38 C) OR HIGHER *CHILLS OR SWEATING *NAUSEA AND VOMITING THAT IS NOT CONTROLLED WITH YOUR NAUSEA MEDICATION *UNUSUAL SHORTNESS OF BREATH *UNUSUAL BRUISING OR BLEEDING *URINARY PROBLEMS (pain or burning when urinating, or frequent urination) *BOWEL PROBLEMS (unusual diarrhea, constipation, pain near the anus) TENDERNESS IN MOUTH AND THROAT WITH OR WITHOUT PRESENCE OF ULCERS (sore throat, sores in mouth, or a toothache) UNUSUAL RASH, SWELLING OR PAIN  UNUSUAL VAGINAL DISCHARGE OR ITCHING   Items with * indicate a potential emergency and should be followed up  as soon as possible or go to the Emergency Department if any problems should occur.  Please show the CHEMOTHERAPY ALERT CARD or IMMUNOTHERAPY ALERT CARD at check-in to the Emergency Department and triage nurse.  Should you have questions after your visit or need to cancel or reschedule your appointment, please contact [facility]  - A DEPT OF Eligha Bridegroom [facility] [phone]  and follow the prompts.  Office hours are 8:00 a.m. to 4:30 p.m. Monday - Friday. Please note that voicemails left after 4:00 p.m. may not be returned until the following business day.  We are closed weekends and major holidays. You have access to a nurse at all times for urgent questions. Please call the main number to the clinic [phone] and follow the prompts.  For any non-urgent questions, you may also contact your provider using MyChart. We now offer e-Visits for anyone 81 and older to request care online for non-urgent symptoms. For details visit [email or web address].   Also download the MyChart app! Go to the app store, search "MyChart", open the app, select [place], and log in with your MyChart username and password.

## 2024-01-27 ENCOUNTER — Telehealth: Payer: Self-pay

## 2024-01-27 NOTE — Telephone Encounter (Signed)
 Copied from CRM [id number]. Topic: Clinical - Medical Advice >> Jan 27, 2024  9:49 AM Carlatta H wrote: Reason for CRM: Please call patient to advise of nutrition drink that she was advised to take at last visit//

## 2024-01-28 NOTE — Telephone Encounter (Signed)
Pt aware of provider feedback and voiced understanding. 

## 2024-01-28 NOTE — Telephone Encounter (Signed)
 Available on her AVS:  Ensure is ok and should be ok at your level of kidney disease.  Nepro is a nutritional shake MADE for people that have kidney disease.

## 2024-02-03 ENCOUNTER — Inpatient Hospital Stay

## 2024-02-04 ENCOUNTER — Inpatient Hospital Stay

## 2024-02-04 VITALS — BP 159/50 | HR 55 | Temp 96.6°F | Resp 18

## 2024-02-04 DIAGNOSIS — D696 Thrombocytopenia, unspecified: Secondary | ICD-10-CM

## 2024-02-04 DIAGNOSIS — N184 Chronic kidney disease, stage 4 (severe): Secondary | ICD-10-CM

## 2024-02-04 DIAGNOSIS — D61818 Other pancytopenia: Secondary | ICD-10-CM

## 2024-02-04 DIAGNOSIS — D631 Anemia in chronic kidney disease: Secondary | ICD-10-CM

## 2024-02-04 DIAGNOSIS — M8580 Other specified disorders of bone density and structure, unspecified site: Secondary | ICD-10-CM

## 2024-02-04 DIAGNOSIS — D500 Iron deficiency anemia secondary to blood loss (chronic): Secondary | ICD-10-CM

## 2024-02-04 LAB — COMPREHENSIVE METABOLIC PANEL WITH GFR
ALT: 14 U/L (ref 0–44)
AST: 23 U/L (ref 15–41)
Albumin: 3.7 g/dL (ref 3.5–5.0)
Alkaline Phosphatase: 43 U/L (ref 38–126)
Anion gap: 9 (ref 5–15)
BUN: 41 mg/dL — ABNORMAL HIGH (ref 8–23)
CO2: 32 mmol/L (ref 22–32)
Calcium: 8.5 mg/dL — ABNORMAL LOW (ref 8.9–10.3)
Chloride: 96 mmol/L — ABNORMAL LOW (ref 98–111)
Creatinine, Ser: 1.79 mg/dL — ABNORMAL HIGH (ref 0.44–1.00)
GFR, Estimated: 27 mL/min — ABNORMAL LOW (ref >60)
Glucose, Bld: 94 mg/dL (ref 70–99)
Potassium: 4.3 mmol/L (ref 3.5–5.1)
Sodium: 137 mmol/L (ref 135–145)
Total Bilirubin: 0.7 mg/dL (ref 0.0–1.2)
Total Protein: 7.2 g/dL (ref 6.5–8.1)

## 2024-02-04 LAB — CBC WITH DIFFERENTIAL/PLATELET
Abs Immature Granulocytes: 0 10*3/uL (ref 0.00–0.07)
Basophils Absolute: 0 10*3/uL (ref 0.0–0.1)
Basophils Relative: 0 %
Eosinophils Absolute: 0 10*3/uL (ref 0.0–0.5)
Eosinophils Relative: 1 %
HCT: 35.1 % — ABNORMAL LOW (ref 36.0–46.0)
Hemoglobin: 10.9 g/dL — ABNORMAL LOW (ref 12.0–15.0)
Immature Granulocytes: 0 %
Lymphocytes Relative: 31 %
Lymphs Abs: 0.7 10*3/uL (ref 0.7–4.0)
MCH: 27 pg (ref 26.0–34.0)
MCHC: 31.1 g/dL (ref 30.0–36.0)
MCV: 87.1 fL (ref 80.0–100.0)
Monocytes Absolute: 0.3 10*3/uL (ref 0.1–1.0)
Monocytes Relative: 11 %
Neutro Abs: 1.3 10*3/uL — ABNORMAL LOW (ref 1.7–7.7)
Neutrophils Relative %: 57 %
Platelets: 96 10*3/uL — ABNORMAL LOW (ref 150–400)
RBC: 4.03 MIL/uL (ref 3.87–5.11)
RDW: 17.3 % — ABNORMAL HIGH (ref 11.5–15.5)
WBC: 2.2 10*3/uL — ABNORMAL LOW (ref 4.0–10.5)
nRBC: 0 % (ref 0.0–0.2)

## 2024-02-04 LAB — FERRITIN: Ferritin: 182 ng/mL (ref 11–307)

## 2024-02-04 LAB — IRON AND TIBC
Iron: 62 ug/dL (ref 28–170)
Saturation Ratios: 21 % (ref 10.4–31.8)
TIBC: 301 ug/dL (ref 250–450)
UIBC: 239 ug/dL

## 2024-02-04 LAB — LACTATE DEHYDROGENASE: LDH: 139 U/L (ref 98–192)

## 2024-02-04 LAB — VITAMIN B12: Vitamin B-12: 1026 pg/mL — ABNORMAL HIGH (ref 180–914)

## 2024-02-04 MED ORDER — EPOETIN ALFA-EPBX 40000 UNIT/ML IJ SOLN
40000.0000 [IU] | Freq: Once | INTRAMUSCULAR | Status: AC
  Administered 2024-02-04: 40000 [IU] via SUBCUTANEOUS
  Filled 2024-02-04: qty 1

## 2024-02-04 NOTE — Progress Notes (Signed)
 HGB 10.9 today. Patient presents today for Retacrit  injection.   [name] presents today for injection per the provider's orders.  Retacrit  administration without incident; injection site WNL; see MAR for injection details.  Patient tolerated procedure well and without incident.  No questions or complaints noted at this time.  Discharged from clinic by wheel chair in stable condition. Alert and oriented x 3. F/U with [facility] as scheduled.

## 2024-02-04 NOTE — Patient Instructions (Signed)
 [facility] [place] - A [facility]PT [facility]  Discharge Instructions: Thank you for choosing  Cancer Center to provide your oncology and hematology care.  If you have a lab appointment with the Cancer Center - please note that after April 8th, 2024, all labs will be drawn in the cancer center.  You do not have to check in or register with the main entrance as you have in the past but will complete your check-in in the cancer center.  Wear comfortable clothing and clothing appropriate for easy access to any Portacath or PICC line.   We strive to give you quality time with your provider. You may need to reschedule your appointment if you arrive late (15 or more minutes).  Arriving late affects you and other patients whose appointments are after yours.  Also, if you miss three or more appointments without notifying the office, you may be dismissed from the clinic at the provider's discretion.      For prescription refill requests, have your pharmacy contact our office and allow 72 hours for refills to be completed.    Today you received the following chemotherapy and/or immunotherapy agents Retacrit. Epoetin Alfa Injection What is this medication? EPOETIN ALFA (e POE e tin AL fa) treats low levels of red blood cells (anemia) caused by kidney disease, chemotherapy, or HIV medications. It can also be used in people who are at risk for blood loss during surgery. It works by Systems analyst make more red blood cells, which reduces the need for blood transfusions. This medicine may be used for other purposes; ask your health care provider or pharmacist if you have questions. COMMON BRAND NAME(S): Epogen, Procrit, Retacrit What should I tell my care team before I take this medication? They need to know if you have any of these conditions: Blood clots Cancer Heart disease High blood pressure On dialysis Seizures Stroke An unusual or allergic reaction to epoetin  alfa, albumin, benzyl alcohol, other medications, foods, dyes, or preservatives Pregnant or trying to get pregnant Breast-feeding How should I use this medication? This medication is injected into a vein or under the skin. It is usually given by your care team in a hospital or clinic setting. It may also be given at home. If you get this medication at home, you will be taught how to prepare and give it. Use exactly as directed. Take it as directed on the prescription label at the same time every day. Keep taking it unless your care team tells you to stop. It is important that you put your used needles and syringes in a special sharps container. Do not put them in a trash can. If you do not have a sharps container, call your pharmacist or care team to get one. A special MedGuide will be given to you by the pharmacist with each prescription and refill. Be sure to read this information carefully each time. Talk to your care team about the use of this medication in children. While this medication may be used in children as young as 1 month of age for selected conditions, precautions do apply. Overdosage: If you think you have taken too much of this medicine contact a poison control center or emergency room at once. NOTE: This medicine is only for you. Do not share this medicine with others. What if I miss a dose? If you miss a dose, take it as soon as you can. If it is almost time for  your next dose, take only that dose. Do not take double or extra doses. What may interact with this medication? Darbepoetin alfa Methoxy polyethylene glycol-epoetin beta This list may not describe all possible interactions. Give your health care provider a list of all the medicines, herbs, non-prescription drugs, or dietary supplements you use. Also tell them if you smoke, drink alcohol, or use illegal drugs. Some items may interact with your medicine. What should I watch for while using this medication? Visit your care  team for regular checks on your progress. Check your blood pressure as directed. Know what your blood pressure should be and when to contact your care team. Your condition will be monitored carefully while you are receiving this medication. You may need blood work while taking this medication. What side effects may I notice from receiving this medication? Side effects that you should report to your care team as soon as possible: Allergic reactions--skin rash, itching, hives, swelling of the face, lips, tongue, or throat Blood clot--pain, swelling, or warmth in the leg, shortness of breath, chest pain Heart attack--pain or tightness in the chest, shoulders, arms, or jaw, nausea, shortness of breath, cold or clammy skin, feeling faint or lightheaded Increase in blood pressure Rash, fever, and swollen lymph nodes Redness, blistering, peeling, or loosening of the skin, including inside the mouth Seizures Stroke--sudden numbness or weakness of the face, arm, or leg, trouble speaking, confusion, trouble walking, loss of balance or coordination, dizziness, severe headache, change in vision Side effects that usually do not require medical attention (report to your care team if they continue or are bothersome): Bone, joint, or muscle pain Cough Headache Nausea Pain, redness, or irritation at injection site This list may not describe all possible side effects. Call your doctor for medical advice about side effects. You may report side effects to FDA at 1-800-FDA-1088. Where should I keep my medication? Keep out of the reach of children and pets. Store in a refrigerator. Do not freeze. Do not shake. Protect from light. Keep this medication in the original container until you are ready to take it. See product for storage information. Get rid of any unused medication after the expiration date. To get rid of medications that are no longer needed or have expired: Take the medication to a medication take-back  program. Check with your pharmacy or law enforcement to find a location. If you cannot return the medication, ask your pharmacist or care team how to get rid of the medication safely. NOTE: This sheet is a summary. It may not cover all possible information. If you have questions about this medicine, talk to your doctor, pharmacist, or health care provider.  2024 Elsevier/Gold Standard (2021-12-06 00:00:00)       To help prevent nausea and vomiting after your treatment, we encourage you to take your nausea medication as directed.  BELOW ARE SYMPTOMS THAT SHOULD BE REPORTED IMMEDIATELY: *FEVER GREATER THAN 100.4 F (38 C) OR HIGHER *CHILLS OR SWEATING *NAUSEA AND VOMITING THAT IS NOT CONTROLLED WITH YOUR NAUSEA MEDICATION *UNUSUAL SHORTNESS OF BREATH *UNUSUAL BRUISING OR BLEEDING *URINARY PROBLEMS (pain or burning when urinating, or frequent urination) *BOWEL PROBLEMS (unusual diarrhea, constipation, pain near the anus) TENDERNESS IN MOUTH AND THROAT WITH OR WITHOUT PRESENCE OF ULCERS (sore throat, sores in mouth, or a toothache) UNUSUAL RASH, SWELLING OR PAIN  UNUSUAL VAGINAL DISCHARGE OR ITCHING   Items with * indicate a potential emergency and should be followed up as soon as possible or go to the Emergency Department  if any problems should occur.  Please show the CHEMOTHERAPY ALERT CARD or IMMUNOTHERAPY ALERT CARD at check-in to the Emergency Department and triage nurse.  Should you have questions after your visit or need to cancel or reschedule your appointment, please contact [facility] [place] - A DEPT OF [facility] [phone]  and follow the prompts.  Office hours are 8:00 a.m. to 4:30 p.m. Monday - Friday. Please note that voicemails left after 4:00 p.m. may not be returned until the following business day.  We are closed weekends and major holidays. You have access to a nurse at all times for urgent questions. Please call the main number to the clinic  [phone] and follow the prompts.  For any non-urgent questions, you may also contact your provider using MyChart. We now offer e-Visits for anyone 88 and older to request care online for non-urgent symptoms. For details visit [email or web address].   Also download the MyChart app! Go to the app store, search "MyChart", open the app, select [place], and log in with your MyChart username and password.

## 2024-02-07 ENCOUNTER — Other Ambulatory Visit: Payer: Self-pay | Admitting: Family Medicine

## 2024-02-07 DIAGNOSIS — I10 Essential (primary) hypertension: Secondary | ICD-10-CM

## 2024-02-07 LAB — METHYLMALONIC ACID, SERUM: Methylmalonic Acid, Quantitative: 682 nmol/L — ABNORMAL HIGH (ref 0–378)

## 2024-02-09 ENCOUNTER — Inpatient Hospital Stay: Admitting: Physician Assistant

## 2024-02-11 DIAGNOSIS — J449 Chronic obstructive pulmonary disease, unspecified: Secondary | ICD-10-CM | POA: Diagnosis not present

## 2024-02-12 ENCOUNTER — Other Ambulatory Visit

## 2024-02-12 DIAGNOSIS — N184 Chronic kidney disease, stage 4 (severe): Secondary | ICD-10-CM | POA: Diagnosis not present

## 2024-02-15 ENCOUNTER — Encounter (HOSPITAL_BASED_OUTPATIENT_CLINIC_OR_DEPARTMENT_OTHER): Payer: Self-pay | Admitting: Pulmonary Disease

## 2024-02-15 ENCOUNTER — Ambulatory Visit (HOSPITAL_BASED_OUTPATIENT_CLINIC_OR_DEPARTMENT_OTHER): Admitting: Pulmonary Disease

## 2024-02-15 VITALS — BP 193/53 | HR 55 | Ht 65.0 in | Wt 107.0 lb

## 2024-02-15 DIAGNOSIS — J9611 Chronic respiratory failure with hypoxia: Secondary | ICD-10-CM | POA: Diagnosis not present

## 2024-02-15 DIAGNOSIS — J4489 Other specified chronic obstructive pulmonary disease: Secondary | ICD-10-CM

## 2024-02-15 NOTE — Progress Notes (Signed)
 Subjective:    Patient ID: [name], female    DOB: 1934/03/06, 89 y.o.   MRN: [id number]  HPI  89 yo  never smoker  for FU of chronic respiratory failure due to CHF /pneumonia & PH due to severe AS She has been seen by [facility] in 08/2021 for asthmatic bronchitis after she had hospitalizations for pneumonia' >> continued on Symbicort  -it seems she was never diagnosed with asthma prior to this     PMH :CKD 3a, HTN,  Chr Diastolic CHF, A fib, mod AS - considered high risk even for TAVR Hypothyroidism, Rt renal artery stenosis, Vertigo, Breast cancer, Esophageal candidiasis  Leukopenia-advised bone marrow biopsy, refused , presumed myelodysplastic syndrome on Procrit every 2 weeks DNR   Initial OV 04/2023  She was hospitalized 8/15 to 8/17 for shortness of breath and noted to be hypoxic to 70%, she was discharged on 2 L oxygen   She has bibasal crackles, review of previous imaging shows increased interstitial infiltrates in 05/2022 which resolved suggesting that this was also pulmonary edema. >> sent an order to [employer] for a lightweight POC    She was hospitalized 05/2023 for bilateral pneumonia, had leukocytosis and increased procalcitonin, treated with antibiotics and improved.  CT scan was done soon after her discharge for pneumonia and showed patchy bilateral infiltrates    OV 07/28/23 >> trelegy, pred x 2 week CXR >> Improving multifocal pneumonia.     presents for follow-up on her oxygen  therapy. She is accompanied by her niece, who had to leave due to a family emergency.  She requires continuous oxygen  therapy due to chronic respiratory failure and asthmatic bronchitis. Oxygen  saturation drops to the 70s during activity without supplemental oxygen . While sitting, her oxygen  levels remain stable briefly without oxygen  but eventually decrease. She occasionally removes her oxygen  to try to breathe more independently.  Her current medications include Trelegy, taken once daily in the  morning, and an albuterol  inhaler for emergency use, though it has not been needed recently. She is unsure if she has any Trelegy left at home and may need a refill.  A CT scan from May showed some scarring in the right upper lung area, but no fibrosis. No new cardiac symptoms and no recent fluid retention issues.  Significant tests/ events reviewed PFTs 06/2023 moderate airway obstruction ratio 65, FEV1 42%, FVC 47%, no bronchodilator response, TLC normal, DLCO 40%  HRCT 12/2023 >> Shifting peribronchovascular ground-glass and peribronchovascular nodularity with predominant involvement of the right upper lobe. Diffuse micro nodular ground-glass and scattered septal thickening. Findings may be due to organizing pneumonia.   HRCT chest 05/2023 >>  Multifocal nodular consolidation in the lungs with septal thickening and ground-glass. Findings may be due to pneumonia. Given nodularity and thickening along the fissures, as well as minimally hyperdense mediastinal adenopathy, consider alveolar sarcoid   04/22/23 Oxygen  saturation at rest on room air was 88% and dropped to 83% on ambulating 1 lap,  POC 2 L to 83%    03/2023 echo: LVEF 60-65%, grade II dd, mod MS mean grad 6, mod to severe AS mean grad 34, AVA VTI 0.93 DI 0.41    05/2022 VQ scan negative. CT abdomen 01/2021 shows clear lungs  Review of Systems neg for any significant sore throat, dysphagia, itching, sneezing, nasal congestion or excess/ purulent secretions, fever, chills, sweats, unintended wt loss, pleuritic or exertional cp, hempoptysis, orthopnea pnd or change in chronic leg swelling. Also denies presyncope, palpitations, heartburn, abdominal pain, nausea, vomiting,  diarrhea or change in bowel or urinary habits, dysuria,hematuria, rash, arthralgias, visual complaints, headache, numbness weakness or ataxia.     Objective:   Physical Exam   Gen. Pleasant, well-nourished, in no distress ENT - no thrush, no pallor/icterus,no post  nasal drip Neck: No JVD, no thyromegaly, no carotid bruits Lungs: no use of accessory muscles, no dullness to percussion, clear without rales or rhonchi  Cardiovascular: Rhythm regular, heart sounds  normal, ESM 3/6 @ base, no peripheral edema Musculoskeletal: No deformities, no cyanosis or clubbing         Assessment & Plan:   Chronic respiratory failure with hypoxia Chronic respiratory failure with hypoxia requiring continuous oxygen  therapy. Oxygen  saturation drops to the 70s during ambulation without oxygen . CT scan from May shows scarring in the right upper lung area, but no fibrosis. Long-term oxygen  therapy is likely needed to prevent cardiac strain due to low oxygen  levels. - Continue oxygen  therapy at 2 L/min during sleep and while sitting - Maintain oxygen  saturation above 88% - Avoid removing oxygen  during activities to prevent hypoxia - Monitor oxygen  levels during ambulation >> good on 2L on ambulation  Asthmatic bronchitis Asthmatic bronchitis managed with Trelegy inhaler. No recent exacerbations reported. Albuterol  inhaler available for emergency use if wheezing or dyspnea occurs. - Refill Trelegy inhaler and use once daily in the morning - Use albuterol  inhaler as needed for wheezing or dyspnea

## 2024-02-15 NOTE — Patient Instructions (Addendum)
 Refills on Trelegy  VISIT SUMMARY:  You came in today for a follow-up on your oxygen  therapy. You require continuous oxygen  therapy due to chronic respiratory failure and asthmatic bronchitis. Your oxygen  levels drop significantly during activity without supplemental oxygen , but remain stable while sitting for a short period. You are currently using Trelegy once daily and have an albuterol  inhaler for emergencies. A recent CT scan showed some scarring in your right upper lung area, but no fibrosis. There are no new cardiac symptoms or fluid retention issues.  YOUR PLAN:  -CHRONIC RESPIRATORY FAILURE WITH HYPOXIA: Chronic respiratory failure with hypoxia means your body is not getting enough oxygen , which can strain your heart. You need to continue using oxygen  therapy at 2 liters per minute while sleeping and sitting, and keep your oxygen  levels above 88%. Avoid removing your oxygen  during activities to prevent low oxygen  levels. Monitor your oxygen  levels during movement with the help of Jamie.  -ASTHMATIC BRONCHITIS: Asthmatic bronchitis is a condition where your airways are inflamed and can cause breathing difficulties. You should continue using your Trelegy inhaler once daily in the morning and refill it if needed. Use your albuterol  inhaler if you experience wheezing or difficulty breathing.

## 2024-02-16 ENCOUNTER — Other Ambulatory Visit (HOSPITAL_BASED_OUTPATIENT_CLINIC_OR_DEPARTMENT_OTHER): Payer: Self-pay

## 2024-02-16 ENCOUNTER — Other Ambulatory Visit (HOSPITAL_BASED_OUTPATIENT_CLINIC_OR_DEPARTMENT_OTHER): Payer: Self-pay | Admitting: Pulmonary Disease

## 2024-02-16 DIAGNOSIS — J849 Interstitial pulmonary disease, unspecified: Secondary | ICD-10-CM

## 2024-02-16 MED ORDER — TRELEGY ELLIPTA 100-62.5-25 MCG/ACT IN AEPB
1.0000 | INHALATION_SPRAY | Freq: Every day | RESPIRATORY_TRACT | 6 refills | Status: DC
Start: 2024-02-16 — End: 2024-02-18

## 2024-02-16 NOTE — Telephone Encounter (Signed)
 Copied from CRM [id number]. Topic: Clinical - Medication Refill >> Feb 16, 2024  2:00 PM Jakia S wrote: Medication: Fluticasone -Umeclidin-Vilant (TRELEGY ELLIPTA ) 100-62.5-25 MCG/ACT AEPB  Has the patient contacted their pharmacy? Yes (Agent: If no, request that the patient contact the pharmacy for the refill. If patient does not wish to contact the pharmacy document the reason why and proceed with request.) (Agent: If yes, when and what did the pharmacy advise?)  This is the patient's preferred pharmacy:  [id number] - [place], [place] - [address] [place] [address] Phone: [phone] Fax: [phone]  Is this the correct pharmacy for this prescription? Yes If no, delete pharmacy and type the correct one.   Has the prescription been filled recently? No  Is the patient out of the medication? Yes  Has the patient been seen for an appointment in the last year OR does the patient have an upcoming appointment? Yes  Can we respond through MyChart? No  Agent: Please be advised that Rx refills may take up to 3 business days. We ask that you follow-up with your pharmacy.

## 2024-02-16 NOTE — Progress Notes (Unsigned)
 [facility] [address], [place] [address]   CLINIC:  Medical Oncology/Hematology  PCP:  Jolinda Norene HERO, DO [address] [place] [address] [phone]   REASON FOR VISIT:  Follow-up for normocytic anemia + leukopenia/thrombocytopenia + history of breast cancer  BRIEF ONCOLOGIC HISTORY:   Oncology History Overview Note  Cancer Staging Malignant neoplasm of lower-outer quadrant of right breast of female, estrogen receptor positive (HCC) Staging form: Breast, AJCC 8th Edition - Clinical stage from 02/22/2018: Stage IA (cT1c, cN0, cM0, G1, ER+, PR+, HER2-) - Signed by Lanny Callander, MD on 03/02/2018    Malignant neoplasm of lower-outer quadrant of right breast of female, estrogen receptor positive (HCC)  02/12/2018 Mammogram   She had routine screening bilateral mammography on 02/12/2018 at [facility] with results showing: indeterminate irregular mass in the right breast.    02/16/2018 Mammogram   She underwent right diagnostic mammography with tomography and right breast ultrasonography at [facility] on 02/16/2018 showing: 1.1 cm irregular mass at the 8 O'clock position on the right breast   02/22/2018 Pathology Results   Diagnosis 1. Breast, right, needle core biopsy, lateral, 8 o'clock - INVASIVE AND IN SITU DUCTAL CARCINOMA. - SEE COMMENT. 2. Breast, right, needle core biopsy, 11 o'clock - FIBROCYSTIC CHANGES. - USUAL DUCTAL HYPERPLASIA. - THERE IS NO EVIDENCE OF MALIGNANCY.   02/22/2018 Receptors her2   IMMUNOHISTOCHEMICAL AND MORPHOMETRIC ANALYSIS PERFORMED MANUALLY Estrogen Receptor: 100%, POSITIVE, STRONG STAINING INTENSITY Progesterone Receptor: 40%, POSITIVE, MODERATE STAINING INTENSITY Proliferation Marker Ki67: 1% Her2 Negative   02/22/2018 Cancer Staging   Staging form: Breast, AJCC 8th Edition - Clinical stage from 02/22/2018: Stage IA (cT1c, cN0, cM0, G1, ER+, PR+, HER2-) - Signed by Lanny Callander, MD on 03/02/2018    02/25/2018 Initial Diagnosis    Malignant neoplasm of lower-outer quadrant of right breast of female, estrogen receptor positive (HCC)     CANCER STAGING:  Cancer Staging  Malignant neoplasm of lower-outer quadrant of right breast of female, estrogen receptor positive (HCC) Staging form: Breast, AJCC 8th Edition - Clinical stage from 02/22/2018: Stage IA (cT1c, cN0, cM0, G1, ER+, PR+, HER2-) - Signed by Lanny Callander, MD on 03/02/2018   INTERVAL HISTORY:   [name], a 89 y.o. female, returns for routine follow-up of her breast cancer and pancytopenia. [name] was last seen on 11/11/2023 by Pleasant Barefoot PA-C.    She has not had any hospitalizations or major changes in her health since her last visit.    At today's visit, she reports feeling fair, with ongoing fatigue. She reports 70% energy and 100% appetite.  She is maintaining stable weight at this time.  PANCYTOPENIA:  She has significant bruising of the extremities.    She reports that she bleeds easily when cut, but she denies any epistaxis, hematochezia, or melena.  She has dyspnea on exertion related to her CHF, and is on supplemental oxygen  around-the-clock.   She denies any exertional chest pain.    She denies any B symptoms.   She continues to take daily iron  supplement with orange juice.  She takes daily vitamin B12 supplement.    BREAST CANCER:  Her history of breast cancer was addressed in detail at visit on 04/22/2023.   At today's visit, she does not have any concerns such as new breast lumps or lymphadenopathy.    She reports that she is tolerating Prolia  injections well without any bone pain, fractures, or jaw pain.  ASSESSMENT & PLAN:  1.  PANCYTOPENIA - Leukopenia and thrombocytopenia since 2015 in addition to anemia that is in part related to CKD, relative iron  deficiency, and B12 deficiency. - Moderate to high suspicion for MDS or other infiltrative bone marrow disorder, but bone marrow biopsy has been declined. - CT abdomen/pelvis  (01/16/2021): Normal-sized spleen.  Liver without any noted abnormalities.   - Folate normal.  Borderline B12 deficiency noted on 12/04/2021 with normal B12 but elevated methylmalonic acid 605 - Rheumatoid factor and ANA negative. - History of acute blood loss anemia requiring 2 units PRBC in January 2023. - EGD (09/01/2021) showed mild chronic gastritis and nonbleeding duodenal ulcer - She is taking ferrous sulfate  325 mg and vitamin B12 500 mcg daily.  - ESA therapy initiated in September 2024.  Current dose is Retacrit  40,000 units every 2 weeks. - Labs (02/04/2024):  Ferritin 182, iron  saturation 21% CMP with baseline CKD stage IV.  LDH normal. Vitamin B12 1026, MMA elevated 682 (likely due to CKD). - CBC today (02/17/2024): Hgb 10.8/MCV 88.7, platelets 73, WBC 2.2/ANC 1.3 - No bright red blood per rectum or melena.  Admits to easy bruising, but denies petechial rash.  No B symptoms, frequent infections, or lymphadenopathy. - DIFFERENTIAL DIAGNOSIS includes probable MDS with multifactorial anemia from CKD, relative iron  deficiency, B12 deficiency.   Extensive discussion with patient and family member (granddaughter [name]) in clinic on 05/12/2023.  Discussed moderate to high clinical suspicion for MDS, but unable to definitively diagnose this or rule out other bone marrow infiltrative process without bone marrow biopsy.  Due to patient's advanced age and other comorbidities, they wished to decline bone marrow biopsy and continue conservative treatment with ESA injections. - PLAN: Continue Retacrit  40,000 units every 2 weeks  - Continue daily iron  tablet daily and vitamin B12 500 mcg daily. - Continue close monitoring and ongoing discussion regarding additional testing and treatment as appropriate.  Patient continues to decline bone marrow biopsy, but would reconsider if there were major changes from baseline or blood counts. - RTC with labs (CBC/D, CMP, LDH, ferritin, iron /TIBC) and office visit in 3  months.  (Recheck B12/MMA in 6 months, around January 2026)  2.  History of stage I right-sided breast cancer (2019) - She had a mammogram on 02/16/2018 which showed B RADS category 0 incomplete.  She was referred for a biopsy. - Right breast biopsy on 02/22/2018 consistent with IDC. - Right lumpectomy on 04/08/2018, IDC, 1.1 cm, grade 1, margins negative, ER/PR positive, HER2 negative, Ki-67 1%. - She declined adjuvant radiation therapy. - Completed 5 years of letrozole  from 05/28/2018 through 06/18/2023.  - BCI testing (see Media file from 10/22/2022) showed NO benefit from endocrine therapy extended beyond 5 years, as she has 1.9% risk of late distant recurrence regardless of whether 5 to 10 years of aromatase inhibitor treatment is given. - Most recent mammogram (03/24/2023): BI-RADS Category 2, benign.  No evidence of breast malignancy in either breast. - Annual breast exam (04/22/2023) negative for any masses, nodules, or lymphadenopathy in either breast - History/ROS negative for any red flag symptoms of recurrent breast cancer. - LFTs normal.   - PLAN:  Annual mammogram and breast exam due August 2025.   3.  Osteopenia - She is at risk for breast cancer associated bone loss in the setting of aromatase inhibitors since 2019 - DEXA scan done 03/07/2019 showed T-score -1.0 - DEXA scan from 04/23/2021 via [facility] was consistent with osteopenia (T-score -1.7) - DEXA scan per PCP (09/16/2023): T-score -  1.8 in right total hip, osteopenia.  FRAX with 18% risk of major osteoporotic fracture and 11.7% risk of hip fracture within the next 10 years. - Patient takes vitamin D  and calcium  supplements at home, with most recent vitamin D  normal at 59.8 (09/30/2022) - Started on Prolia  in February 2024 due to elevated FRAX. - Tolerating Prolia  well.  No fractures, bone pain, or jaw pain since last visit. - PLAN: Continue Prolia  every 6 months (given 11/11/2023 - next due September 2025)  -- Continue calcium  and vitamin  D supplements with weight-bearing exercise as tolerated - Next bone density scan due around January 2027, can be ordered by us  or by PCP.  PLAN SUMMARY:  >> Mammogram due in August 2025 >> Prolia  injection due in September 2025 >> CBC/BB sample + Retacrit  40,000 units every 2 weeks  >> Labs in 3 months = CBC/D, CMP, LDH, ferritin, iron /TIBC >> OFFICE visit in 3 months (2 weeks after labs) + same day CBC/Injection  **NEXT OFFICE VISIT to include evaluation of breast cancer and annual breast exam    REVIEW OF SYSTEMS:  Review of Systems  Constitutional:  Negative for appetite change, chills, diaphoresis, fatigue, fever and unexpected weight change.  HENT:   Negative for lump/mass and nosebleeds.   Eyes:  Negative for eye problems.  Respiratory:  Positive for shortness of breath. Negative for cough and hemoptysis.   Cardiovascular:  Negative for chest pain, leg swelling and palpitations.  Gastrointestinal:  Negative for abdominal pain, blood in stool, constipation, diarrhea, nausea and vomiting.  Genitourinary:  Negative for hematuria.   Skin: Negative.   Neurological:  Negative for dizziness, headaches and light-headedness.  Hematological:  Bruises/bleeds easily.   PHYSICAL EXAM:   Performance status (ECOG): 2 - Symptomatic, <50% confined to bed  Vitals:   02/17/24 1026 02/17/24 1031  BP: (!) 190/59 (!) 179/56  Pulse: (!) 55   Resp: 20   Temp: (!) 96.8 F (36 C)   SpO2: 90%    Wt Readings from Last 3 Encounters:  02/17/24 108 lb (49 kg)  02/15/24 107 lb (48.5 kg)  01/18/24 106 lb (48.1 kg)   Physical Exam Constitutional:      Appearance: Normal appearance.     Comments: Thin body habitus  Cardiovascular:     Rate and Rhythm: Normal rate and regular rhythm.     Pulses: Normal pulses.     Heart sounds: Murmur heard.  Pulmonary:     Effort: Pulmonary effort is normal.     Breath sounds: Decreased air movement present.  Skin:    Findings: Bruising (extremities)  present.  Neurological:     General: No focal deficit present.     Mental Status: She is alert and oriented to person, place, and time.  Psychiatric:        Mood and Affect: Mood normal.        Behavior: Behavior normal.     PAST MEDICAL/SURGICAL HISTORY:  Past Medical History:  Diagnosis Date   Anemia    Arthritis    Asthma    Atrial fibrillation (HCC)    Basal cell carcinoma 02/06/2009   Left ear targus- (MOHS)   Basal cell carcinoma 09/28/2008   Right back-([id number])   CKD (chronic kidney disease), stage III (HCC)    COPD (chronic obstructive pulmonary disease) (HCC)    Heart murmur    Hyperlipidemia    Hypertension    Hypothyroidism    Nodule of right lung    Right upper  lobe   Osteopenia due to cancer therapy 10/07/2022   Pneumonia    PONV (postoperative nausea and vomiting)    Renal insufficiency    Chronic   Renal vascular disease    Right renal artery stenosis (HCC) 05/09/2015   Squamous cell carcinoma of skin 09/07/2019   KA-Left shin-txpbx   Vertigo    Past Surgical History:  Procedure Laterality Date   BIOPSY  04/09/2021   Procedure: BIOPSY;  Surgeon: Eartha Angelia Sieving, MD;  [facility];  Service: Gastroenterology;;   BREAST LUMPECTOMY WITH RADIOACTIVE SEED LOCALIZATION Right 04/08/2018   Procedure: BREAST LUMPECTOMY WITH RADIOACTIVE SEED LOCALIZATION;  Surgeon: Ebbie Cough, MD;  Location[facility] OR;  Service: General;  Laterality: Right;   COLONOSCOPY  2016   diverticulosis, hemorrhoids   ESOPHAGOGASTRODUODENOSCOPY  2016   normal   ESOPHAGOGASTRODUODENOSCOPY (EGD) WITH PROPOFOL  N/A 04/09/2021   Procedure: ESOPHAGOGASTRODUODENOSCOPY (EGD) WITH PROPOFOL ;  Surgeon: Eartha Angelia Sieving, MD;  Location: [facility];  Service: Gastroenterology;  Laterality: N/A;  12:45   ESOPHAGOGASTRODUODENOSCOPY (EGD) WITH PROPOFOL  N/A 09/01/2021   Procedure: ESOPHAGOGASTRODUODENOSCOPY (EGD) WITH PROPOFOL ;  Surgeon: Cindie Carlin POUR, DO;  Location:  [facility];  Service: Endoscopy;  Laterality: N/A;   IR GENERIC HISTORICAL  08/29/2016   IR US  GUIDE VASC ACCESS RIGHT 08/29/2016 [facility]   IR GENERIC HISTORICAL  08/29/2016   IR RENAL BILAT S&I MOD SED 08/29/2016 [facility]   KNEE ARTHROSCOPY Right    2019   THYROIDECTOMY, PARTIAL Right 1981   middle lobe removed 1st, then right lobe removed 7-8 years later (approx. 1988)    SOCIAL HISTORY:  Social History   Socioeconomic History   Marital status: Widowed    Spouse name: Not on file   Number of children: 2   Years of education: 58   Highest education level: Not on file  Occupational History   Occupation: Retired  Tobacco Use   Smoking status: Never   Smokeless tobacco: Never  Vaping Use   Vaping status: Never Used  Substance and Sexual Activity   Alcohol  use: No   Drug use: No   Sexual activity: Not Currently  Other Topics Concern   Not on file  Social History Narrative   Lives alone, but her daughter stays there at night   Ambidextrous (writes with left hand).   No caffeine use.   Social Drivers of Corporate investment banker Strain: Low Risk  (12/18/2023)   Overall Financial Resource Strain (CARDIA)    Difficulty of Paying Living Expenses: Not hard at all  Food Insecurity: No Food Insecurity (12/18/2023)   Hunger Vital Sign    Worried About Running Out of Food in the Last Year: Never true    Ran Out of Food in the Last Year: Never true  Transportation Needs: No Transportation Needs (12/18/2023)   PRAPARE - Administrator, Civil Service (Medical): No    Lack of Transportation (Non-Medical): No  Physical Activity: Inactive (12/18/2023)   Exercise Vital Sign    Days of Exercise per Week: 0 days    Minutes of Exercise per Session: 0 min  Stress: No Stress Concern Present (12/18/2023)   [employer] of Occupational Health - Occupational Stress Questionnaire    Feeling of Stress : Not at all  Social Connections: Socially Isolated (12/18/2023)    Social Connection and Isolation Panel    Frequency of Communication with Friends and Family: More than three times a week    Frequency  of Social Gatherings with Friends and Family: More than three times a week    Attends Religious Services: Never    Database administrator or Organizations: No    Attends Banker Meetings: Never    Marital Status: Widowed  Intimate Partner Violence: Not At Risk (12/18/2023)   Humiliation, Afraid, Rape, and Kick questionnaire    Fear of Current or Ex-Partner: No    Emotionally Abused: No    Physically Abused: No    Sexually Abused: No    FAMILY HISTORY:  Family History  Problem Relation Age of Onset   Thyroid  disease Mother    Stroke Father    Hypertension Brother    Lung cancer Maternal Uncle    Cancer Maternal Grandmother        Liver   Lung cancer Maternal Uncle    Hypertension Daughter    Hypercholesterolemia Daughter    Hypercholesterolemia Daughter     CURRENT MEDICATIONS:  Current Outpatient Medications  Medication Sig Dispense Refill   albuterol  (VENTOLIN  HFA) 108 (90 Base) MCG/ACT inhaler Inhale 2 puffs into the lungs every 6 (six) hours as needed for wheezing or shortness of breath. 8 g 6   atorvastatin  (LIPITOR) 20 MG tablet TAKE 1/2 TABLET BY MOUTH DAILY 45 tablet 1   bisoprolol  (ZEBETA ) 5 MG tablet TAKE 1 TABLET (5 MG TOTAL) BY MOUTH DAILY. 90 tablet 0   cloNIDine  (CATAPRES ) 0.1 MG tablet Take 1 tablet (0.1 mg total) by mouth as directed. Take 1 tablet (0.1 mg total) by mouth daily as needed for systolic blood pressure greater than 180. 20 tablet 2   feeding supplement (ENSURE ENLIVE / ENSURE PLUS) LIQD Take 237 mLs by mouth 2 (two) times daily between meals. (Patient taking differently: Take 237 mLs by mouth daily as needed (decreased appetite).) 237 mL 12   ferrous sulfate  325 (65 FE) MG tablet Take 325 mg by mouth daily with breakfast.     flecainide  (TAMBOCOR ) 50 MG tablet TAKE 1 TABLET BY MOUTH TWICE A DAY 180 tablet 2    Fluticasone -Umeclidin-Vilant (TRELEGY ELLIPTA ) 100-62.5-25 MCG/ACT AEPB Inhale 1 puff into the lungs daily. 1 each 6   hydrALAZINE  (APRESOLINE ) 50 MG tablet TAKE 1 TABLET BY MOUTH TWICE A DAY 180 tablet 3   levothyroxine  (SYNTHROID ) 75 MCG tablet TAKE 1 TABLET BY MOUTH EVERY DAY 90 tablet 0   lidocaine  (LIDODERM ) 5 % Place 1 patch onto the skin daily. Remove & Discard patch within 12 hours or as directed by MD 30 patch 0   mirtazapine  (REMERON ) 7.5 MG tablet TAKE 1 TABLET BY MOUTH EVERYDAY AT BEDTIME 90 tablet 3   Multiple Vitamins-Minerals (MULTIVITAMIN WOMEN 50+) TABS Take 1 tablet by mouth at bedtime.     pantoprazole  (PROTONIX ) 40 MG tablet TAKE 1 TABLET BY MOUTH EVERY DAY 90 tablet 1   torsemide  (DEMADEX ) 20 MG tablet TAKE 1 TABLET BY MOUTH TWICE A DAY 180 tablet 0   No current facility-administered medications for this visit.    ALLERGIES:  Allergies  Allergen Reactions   Norvasc  [Amlodipine ] Swelling    Edema    Tape Other (See Comments)    Has to have no stick tape.   Zestril  [Lisinopril ] Cough   Hytrin  [Terazosin ] Hives and Nausea Only   Imdur  [Isosorbide  Nitrate] Nausea Only   Sulfonamide Derivatives Nausea And Vomiting and Rash    LABORATORY DATA:  I have reviewed the labs as listed.     Latest Ref Rng & Units 02/17/2024  9:22 AM 02/04/2024   10:18 AM 01/21/2024   10:08 AM  CBC  WBC 4.0 - 10.5 K/uL 2.2  2.2  2.2   Hemoglobin 12.0 - 15.0 g/dL 89.1  89.0  89.1   Hematocrit 36.0 - 46.0 % 34.9  35.1  34.1   Platelets 150 - 400 K/uL 73  96  116       Latest Ref Rng & Units 02/04/2024   10:18 AM 01/14/2024    5:34 PM 11/15/2023    2:14 PM  CMP  Glucose 70 - 99 mg/dL 94  897  887   BUN 8 - 23 mg/dL 41  38  36   Creatinine 0.44 - 1.00 mg/dL 8.20  8.29  8.07   Sodium 135 - 145 mmol/L 137  134  135   Potassium 3.5 - 5.1 mmol/L 4.3  4.2  4.0   Chloride 98 - 111 mmol/L 96  90  93   CO2 22 - 32 mmol/L 32  30  32   Calcium  8.9 - 10.3 mg/dL 8.5  8.8  8.3   Total Protein  6.5 - 8.1 g/dL 7.2  7.5  7.5   Total Bilirubin 0.0 - 1.2 mg/dL 0.7  0.8  0.8   Alkaline Phos 38 - 126 U/L 43  46  44   AST 15 - 41 U/L 23  19  21    ALT 0 - 44 U/L 14  11  11      DIAGNOSTIC IMAGING:  I have independently reviewed the scans and discussed with the patient. No results found.    WRAP UP:  All questions were answered. The patient knows to call the clinic with any problems, questions or concerns.  Medical decision making: Moderate  Time spent on visit: I spent 20 minutes counseling the patient face to face. The total time spent in the appointment was 30 minutes and more than 50% was on counseling.  Pleasant CHRISTELLA Barefoot, PA-C  02/17/24 11:55 AM

## 2024-02-16 NOTE — Telephone Encounter (Signed)
 Rx sent to pharmacy

## 2024-02-17 ENCOUNTER — Inpatient Hospital Stay: Attending: Physician Assistant | Admitting: Physician Assistant

## 2024-02-17 ENCOUNTER — Other Ambulatory Visit (HOSPITAL_COMMUNITY): Payer: Self-pay | Admitting: Physician Assistant

## 2024-02-17 ENCOUNTER — Inpatient Hospital Stay

## 2024-02-17 VITALS — BP 179/56 | HR 55 | Temp 96.8°F | Resp 20 | Wt 108.0 lb

## 2024-02-17 DIAGNOSIS — D61818 Other pancytopenia: Secondary | ICD-10-CM

## 2024-02-17 DIAGNOSIS — Z170 Estrogen receptor positive status [ER+]: Secondary | ICD-10-CM | POA: Diagnosis not present

## 2024-02-17 DIAGNOSIS — D649 Anemia, unspecified: Secondary | ICD-10-CM

## 2024-02-17 DIAGNOSIS — D500 Iron deficiency anemia secondary to blood loss (chronic): Secondary | ICD-10-CM

## 2024-02-17 DIAGNOSIS — D631 Anemia in chronic kidney disease: Secondary | ICD-10-CM | POA: Diagnosis present

## 2024-02-17 DIAGNOSIS — C50511 Malignant neoplasm of lower-outer quadrant of right female breast: Secondary | ICD-10-CM | POA: Insufficient documentation

## 2024-02-17 DIAGNOSIS — N184 Chronic kidney disease, stage 4 (severe): Secondary | ICD-10-CM | POA: Insufficient documentation

## 2024-02-17 DIAGNOSIS — D696 Thrombocytopenia, unspecified: Secondary | ICD-10-CM | POA: Diagnosis not present

## 2024-02-17 DIAGNOSIS — M8580 Other specified disorders of bone density and structure, unspecified site: Secondary | ICD-10-CM

## 2024-02-17 DIAGNOSIS — Z853 Personal history of malignant neoplasm of breast: Secondary | ICD-10-CM

## 2024-02-17 LAB — CBC
HCT: 34.9 % — ABNORMAL LOW (ref 36.0–46.0)
Hemoglobin: 10.8 g/dL — ABNORMAL LOW (ref 12.0–15.0)
MCH: 27.1 pg (ref 26.0–34.0)
MCHC: 30.9 g/dL (ref 30.0–36.0)
MCV: 87.7 fL (ref 80.0–100.0)
Platelets: 73 10*3/uL — ABNORMAL LOW (ref 150–400)
RBC: 3.98 MIL/uL (ref 3.87–5.11)
RDW: 17.7 % — ABNORMAL HIGH (ref 11.5–15.5)
WBC: 2.2 10*3/uL — ABNORMAL LOW (ref 4.0–10.5)
nRBC: 0 % (ref 0.0–0.2)

## 2024-02-17 LAB — SAMPLE TO BLOOD BANK

## 2024-02-17 MED ORDER — EPOETIN ALFA-EPBX 40000 UNIT/ML IJ SOLN
40000.0000 [IU] | Freq: Once | INTRAMUSCULAR | Status: AC
  Administered 2024-02-17: 40000 [IU] via SUBCUTANEOUS
  Filled 2024-02-17: qty 1

## 2024-02-17 NOTE — Progress Notes (Signed)
 Patient presents today for Retacrit  injection BP 179/56, okay for retacrit , per  Pleasant Barefoot.  Hemoglobin reviewed prior to administration. VSS tolerated without incident or complaint. See MAR for details. Patient stable during and after injection. Patient discharged in satisfactory condition with no s/s of distress noted.

## 2024-02-17 NOTE — Patient Instructions (Signed)

## 2024-02-17 NOTE — Patient Instructions (Signed)
 [facility] at [facility] Discharge Instructions  You were seen today by Pleasant Barefoot PA-C for your low blood counts.  LOW BLOOD COUNTS:   Your low hemoglobin/red blood cells may be related to your chronic kidney disease.   You may also have some underlying bone marrow dysfunction, such as a condition called MDS (myelodysplastic syndrome).  That can only be proven with bone marrow biopsy, but you have previously stated that you would like to avoid a bone marrow biopsy. We will continue to give you injections (Retacrit ) once every 2 weeks.  This will help your body to make more healthy red blood cells and can be used to treat both anemia from chronic kidney disease and MDS.     IRON : Continue to take iron  tablet ONCE daily.  VITAMIN B12:  Continue to take over-the-counter vitamin B12 supplement 500 mcg daily.  FOLLOW-UP APPOINTMENT: Office visit in 3 months with Pleasant Barefoot PA-C   ** Thank you for trusting me with your healthcare!  I strive to provide all of my patients with quality care at each visit.  If you receive a survey for this visit, I would be so grateful to you for taking the time to provide feedback.  Thank you in advance!  ~ Katelee Schupp                   Dr. Alean Stands   &   Pleasant Barefoot, PA-C   - - - - - - - - - - - - - - - - - -    Thank you for choosing [facility] at [facility] to provide your oncology and hematology care.  To afford each patient quality time with our provider, please arrive at least 15 minutes before your scheduled appointment time.   If you have a lab appointment with the Cancer Center please come in thru the Main Entrance and check in at the main information desk.  You need to re-schedule your appointment should you arrive 10 or more minutes late.  We strive to give you quality time with our providers, and arriving late affects you and other patients whose appointments are after  yours.  Also, if you no show three or more times for appointments you may be dismissed from the clinic at the providers discretion.     Again, thank you for choosing [facility].  Our hope is that these requests will decrease the amount of time that you wait before being seen by our physicians.       _____________________________________________________________  Should you have questions after your visit to [facility], please contact our office at [phone] and follow the prompts.  Our office hours are 8:00 a.m. and 4:30 p.m. Monday - Friday.  Please note that voicemails left after 4:00 p.m. may not be returned until the following business day.  We are closed weekends and major holidays.  You do have access to a nurse 24-7, just call the main number to the clinic [phone] and do not press any options, hold on the line and a nurse will answer the phone.    For prescription refill requests, have your pharmacy contact our office and allow 72 hours.    Due to Covid, you will need to wear a mask upon entering the hospital. If you do not have a mask, a mask will be given to you at the Main Entrance upon arrival. For doctor visits,  patients may have 1 support person age 27 or older with them. For treatment visits, patients can not have anyone with them due to social distancing guidelines and our immunocompromised population.

## 2024-02-18 ENCOUNTER — Other Ambulatory Visit (HOSPITAL_BASED_OUTPATIENT_CLINIC_OR_DEPARTMENT_OTHER): Payer: Self-pay

## 2024-02-18 DIAGNOSIS — J849 Interstitial pulmonary disease, unspecified: Secondary | ICD-10-CM

## 2024-02-18 MED ORDER — TRELEGY ELLIPTA 100-62.5-25 MCG/ACT IN AEPB: 1.0000 | INHALATION_SPRAY | Freq: Every day | RESPIRATORY_TRACT | 5 refills | Status: DC

## 2024-02-23 ENCOUNTER — Ambulatory Visit: Admitting: Family Medicine

## 2024-02-25 DIAGNOSIS — D649 Anemia, unspecified: Secondary | ICD-10-CM | POA: Diagnosis not present

## 2024-02-25 DIAGNOSIS — N184 Chronic kidney disease, stage 4 (severe): Secondary | ICD-10-CM | POA: Diagnosis not present

## 2024-02-25 DIAGNOSIS — I129 Hypertensive chronic kidney disease with stage 1 through stage 4 chronic kidney disease, or unspecified chronic kidney disease: Secondary | ICD-10-CM | POA: Diagnosis not present

## 2024-02-25 DIAGNOSIS — N2581 Secondary hyperparathyroidism of renal origin: Secondary | ICD-10-CM | POA: Diagnosis not present

## 2024-03-02 ENCOUNTER — Other Ambulatory Visit: Payer: Self-pay | Admitting: Family Medicine

## 2024-03-03 ENCOUNTER — Inpatient Hospital Stay

## 2024-03-03 VITALS — BP 221/68 | HR 55 | Temp 97.9°F | Resp 17

## 2024-03-03 DIAGNOSIS — D649 Anemia, unspecified: Secondary | ICD-10-CM

## 2024-03-03 DIAGNOSIS — M8580 Other specified disorders of bone density and structure, unspecified site: Secondary | ICD-10-CM

## 2024-03-03 DIAGNOSIS — C50511 Malignant neoplasm of lower-outer quadrant of right female breast: Secondary | ICD-10-CM | POA: Diagnosis not present

## 2024-03-03 LAB — CBC
HCT: 34.4 % — ABNORMAL LOW (ref 36.0–46.0)
Hemoglobin: 10.8 g/dL — ABNORMAL LOW (ref 12.0–15.0)
MCH: 27.6 pg (ref 26.0–34.0)
MCHC: 31.4 g/dL (ref 30.0–36.0)
MCV: 87.8 fL (ref 80.0–100.0)
Platelets: 78 K/uL — ABNORMAL LOW (ref 150–400)
RBC: 3.92 MIL/uL (ref 3.87–5.11)
RDW: 17.4 % — ABNORMAL HIGH (ref 11.5–15.5)
WBC: 2.5 K/uL — ABNORMAL LOW (ref 4.0–10.5)
nRBC: 0 % (ref 0.0–0.2)

## 2024-03-03 LAB — SAMPLE TO BLOOD BANK

## 2024-03-03 MED ORDER — EPOETIN ALFA-EPBX 40000 UNIT/ML IJ SOLN
40000.0000 [IU] | Freq: Once | INTRAMUSCULAR | Status: AC
  Administered 2024-03-03: 40000 [IU] via SUBCUTANEOUS
  Filled 2024-03-03: qty 1

## 2024-03-03 NOTE — Progress Notes (Signed)
 Hemoglobin today is 10.8.  BP elevated (196/63) at arrival.  We will recheck in 5 minutes.    BP 221/68 at recheck.  Delon Hope, NP notified.  Patient took her personal Clonidine  0.1 mg here while in the office.  Patient states that BP will improve once she is discharged.  Patient educated on the effects of hypertension and when to seek emergency care.  Ok to proceed with Retacrit  per Delon Hope, NP.  Patient tolerated injection with no complaints voiced.  Site clean and dry with no bruising or swelling noted.  No complaints of pain.  Discharged with vital signs stable and no signs or symptoms of distress noted.

## 2024-03-03 NOTE — Patient Instructions (Signed)
 [facility] [place] - A DEPT OF [facility]  Discharge Instructions: Thank you for choosing [facility] to provide your oncology and hematology care.  If you have a lab appointment with the Cancer Center - please note that after April 8th, 2024, all labs will be drawn in the cancer center.  You do not have to check in or register with the main entrance as you have in the past but will complete your check-in in the cancer center.  Wear comfortable clothing and clothing appropriate for easy access to any Portacath or PICC line.   We strive to give you quality time with your provider. You may need to reschedule your appointment if you arrive late (15 or more minutes).  Arriving late affects you and other patients whose appointments are after yours.  Also, if you miss three or more appointments without notifying the office, you may be dismissed from the clinic at the provider's discretion.      For prescription refill requests, have your pharmacy contact our office and allow 72 hours for refills to be completed.    Today you received the following :  Retacrit.  Epoetin Alfa Injection What is this medication? EPOETIN ALFA (e POE e tin AL fa) treats low levels of red blood cells (anemia) caused by kidney disease, chemotherapy, or HIV medications. It can also be used in people who are at risk for blood loss during surgery. It works by Systems analyst make more red blood cells, which reduces the need for blood transfusions. This medicine may be used for other purposes; ask your health care provider or pharmacist if you have questions. COMMON BRAND NAME(S): Epogen, Procrit, Retacrit What should I tell my care team before I take this medication? They need to know if you have any of these conditions: Blood clots Cancer Heart disease High blood pressure On dialysis Seizures Stroke An unusual or allergic reaction to epoetin alfa, albumin, benzyl alcohol, other  medications, foods, dyes, or preservatives Pregnant or trying to get pregnant Breast-feeding How should I use this medication? This medication is injected into a vein or under the skin. It is usually given by your care team in a hospital or clinic setting. It may also be given at home. If you get this medication at home, you will be taught how to prepare and give it. Use exactly as directed. Take it as directed on the prescription label at the same time every day. Keep taking it unless your care team tells you to stop. It is important that you put your used needles and syringes in a special sharps container. Do not put them in a trash can. If you do not have a sharps container, call your pharmacist or care team to get one. A special MedGuide will be given to you by the pharmacist with each prescription and refill. Be sure to read this information carefully each time. Talk to your care team about the use of this medication in children. While this medication may be used in children as young as 1 month of age for selected conditions, precautions do apply. Overdosage: If you think you have taken too much of this medicine contact a poison control center or emergency room at once. NOTE: This medicine is only for you. Do not share this medicine with others. What if I miss a dose? If you miss a dose, take it as soon as you can. If it is almost time for your  next dose, take only that dose. Do not take double or extra doses. What may interact with this medication? Darbepoetin alfa Methoxy polyethylene glycol-epoetin beta This list may not describe all possible interactions. Give your health care provider a list of all the medicines, herbs, non-prescription drugs, or dietary supplements you use. Also tell them if you smoke, drink alcohol, or use illegal drugs. Some items may interact with your medicine. What should I watch for while using this medication? Visit your care team for regular checks on your  progress. Check your blood pressure as directed. Know what your blood pressure should be and when to contact your care team. Your condition will be monitored carefully while you are receiving this medication. You may need blood work while taking this medication. What side effects may I notice from receiving this medication? Side effects that you should report to your care team as soon as possible: Allergic reactions--skin rash, itching, hives, swelling of the face, lips, tongue, or throat Blood clot--pain, swelling, or warmth in the leg, shortness of breath, chest pain Heart attack--pain or tightness in the chest, shoulders, arms, or jaw, nausea, shortness of breath, cold or clammy skin, feeling faint or lightheaded Increase in blood pressure Rash, fever, and swollen lymph nodes Redness, blistering, peeling, or loosening of the skin, including inside the mouth Seizures Stroke--sudden numbness or weakness of the face, arm, or leg, trouble speaking, confusion, trouble walking, loss of balance or coordination, dizziness, severe headache, change in vision Side effects that usually do not require medical attention (report to your care team if they continue or are bothersome): Bone, joint, or muscle pain Cough Headache Nausea Pain, redness, or irritation at injection site This list may not describe all possible side effects. Call your doctor for medical advice about side effects. You may report side effects to FDA at 1-800-FDA-1088. Where should I keep my medication? Keep out of the reach of children and pets. Store in a refrigerator. Do not freeze. Do not shake. Protect from light. Keep this medication in the original container until you are ready to take it. See product for storage information. Get rid of any unused medication after the expiration date. To get rid of medications that are no longer needed or have expired: Take the medication to a medication take-back program. Check with your  pharmacy or law enforcement to find a location. If you cannot return the medication, ask your pharmacist or care team how to get rid of the medication safely. NOTE: This sheet is a summary. It may not cover all possible information. If you have questions about this medicine, talk to your doctor, pharmacist, or health care provider.  2024 Elsevier/Gold Standard (2021-12-06 00:00:00)    To help prevent nausea and vomiting after your treatment, we encourage you to take your nausea medication as directed.  BELOW ARE SYMPTOMS THAT SHOULD BE REPORTED IMMEDIATELY: *FEVER GREATER THAN 100.4 F (38 C) OR HIGHER *CHILLS OR SWEATING *NAUSEA AND VOMITING THAT IS NOT CONTROLLED WITH YOUR NAUSEA MEDICATION *UNUSUAL SHORTNESS OF BREATH *UNUSUAL BRUISING OR BLEEDING *URINARY PROBLEMS (pain or burning when urinating, or frequent urination) *BOWEL PROBLEMS (unusual diarrhea, constipation, pain near the anus) TENDERNESS IN MOUTH AND THROAT WITH OR WITHOUT PRESENCE OF ULCERS (sore throat, sores in mouth, or a toothache) UNUSUAL RASH, SWELLING OR PAIN  UNUSUAL VAGINAL DISCHARGE OR ITCHING   Items with * indicate a potential emergency and should be followed up as soon as possible or go to the Emergency Department if any problems should  occur.  Please show the CHEMOTHERAPY ALERT CARD or IMMUNOTHERAPY ALERT CARD at check-in to the Emergency Department and triage nurse.  Should you have questions after your visit or need to cancel or reschedule your appointment, please contact [facility] [place] - A DEPT OF [facility] [phone]  and follow the prompts.  Office hours are 8:00 a.m. to 4:30 p.m. Monday - Friday. Please note that voicemails left after 4:00 p.m. may not be returned until the following business day.  We are closed weekends and major holidays. You have access to a nurse at all times for urgent questions. Please call the main number to the clinic [phone] and follow the  prompts.  For any non-urgent questions, you may also contact your provider using MyChart. We now offer e-Visits for anyone 73 and older to request care online for non-urgent symptoms. For details visit [email or web address].   Also download the MyChart app! Go to the app store, search "MyChart", open the app, select [place], and log in with your MyChart username and password.

## 2024-03-12 DIAGNOSIS — J449 Chronic obstructive pulmonary disease, unspecified: Secondary | ICD-10-CM | POA: Diagnosis not present

## 2024-03-17 ENCOUNTER — Inpatient Hospital Stay

## 2024-03-17 VITALS — BP 169/61 | HR 56 | Temp 98.0°F | Resp 18

## 2024-03-17 DIAGNOSIS — C50511 Malignant neoplasm of lower-outer quadrant of right female breast: Secondary | ICD-10-CM | POA: Diagnosis not present

## 2024-03-17 DIAGNOSIS — M8580 Other specified disorders of bone density and structure, unspecified site: Secondary | ICD-10-CM

## 2024-03-17 DIAGNOSIS — D649 Anemia, unspecified: Secondary | ICD-10-CM

## 2024-03-17 LAB — SAMPLE TO BLOOD BANK

## 2024-03-17 LAB — CBC
HCT: 33.2 % — ABNORMAL LOW (ref 36.0–46.0)
Hemoglobin: 10.5 g/dL — ABNORMAL LOW (ref 12.0–15.0)
MCH: 27.5 pg (ref 26.0–34.0)
MCHC: 31.6 g/dL (ref 30.0–36.0)
MCV: 86.9 fL (ref 80.0–100.0)
Platelets: 95 K/uL — ABNORMAL LOW (ref 150–400)
RBC: 3.82 MIL/uL — ABNORMAL LOW (ref 3.87–5.11)
RDW: 17.3 % — ABNORMAL HIGH (ref 11.5–15.5)
WBC: 2.3 K/uL — ABNORMAL LOW (ref 4.0–10.5)
nRBC: 0 % (ref 0.0–0.2)

## 2024-03-17 MED ORDER — EPOETIN ALFA-EPBX 40000 UNIT/ML IJ SOLN
40000.0000 [IU] | Freq: Once | INTRAMUSCULAR | Status: AC
  Administered 2024-03-17: 40000 [IU] via SUBCUTANEOUS
  Filled 2024-03-17: qty 1

## 2024-03-17 NOTE — Progress Notes (Signed)
 Patient presents today for Retacrit  injection. HGB 10.5. VSS. Patient in satisfactory condition with no complaints.   Patient tolerated injection in left arm with no complaints voiced.  Site clean and dry with no bruising or swelling noted.  No complaints of pain.  Discharged with vital signs stable and no signs or symptoms of distress noted.

## 2024-03-17 NOTE — Patient Instructions (Signed)
 [facility] [place] - A DEPT OF [facility]  Discharge Instructions: Thank you for choosing [facility] to provide your oncology and hematology care.  If you have a lab appointment with the Cancer Center - please note that after April 8th, 2024, all labs will be drawn in the cancer center.  You do not have to check in or register with the main entrance as you have in the past but will complete your check-in in the cancer center.  Wear comfortable clothing and clothing appropriate for easy access to any Portacath or PICC line.   We strive to give you quality time with your provider. You may need to reschedule your appointment if you arrive late (15 or more minutes).  Arriving late affects you and other patients whose appointments are after yours.  Also, if you miss three or more appointments without notifying the office, you may be dismissed from the clinic at the provider's discretion.      For prescription refill requests, have your pharmacy contact our office and allow 72 hours for refills to be completed.    Today you received the following :  Retacrit.  Epoetin Alfa Injection What is this medication? EPOETIN ALFA (e POE e tin AL fa) treats low levels of red blood cells (anemia) caused by kidney disease, chemotherapy, or HIV medications. It can also be used in people who are at risk for blood loss during surgery. It works by Systems analyst make more red blood cells, which reduces the need for blood transfusions. This medicine may be used for other purposes; ask your health care provider or pharmacist if you have questions. COMMON BRAND NAME(S): Epogen, Procrit, Retacrit What should I tell my care team before I take this medication? They need to know if you have any of these conditions: Blood clots Cancer Heart disease High blood pressure On dialysis Seizures Stroke An unusual or allergic reaction to epoetin alfa, albumin, benzyl alcohol, other  medications, foods, dyes, or preservatives Pregnant or trying to get pregnant Breast-feeding How should I use this medication? This medication is injected into a vein or under the skin. It is usually given by your care team in a hospital or clinic setting. It may also be given at home. If you get this medication at home, you will be taught how to prepare and give it. Use exactly as directed. Take it as directed on the prescription label at the same time every day. Keep taking it unless your care team tells you to stop. It is important that you put your used needles and syringes in a special sharps container. Do not put them in a trash can. If you do not have a sharps container, call your pharmacist or care team to get one. A special MedGuide will be given to you by the pharmacist with each prescription and refill. Be sure to read this information carefully each time. Talk to your care team about the use of this medication in children. While this medication may be used in children as young as 1 month of age for selected conditions, precautions do apply. Overdosage: If you think you have taken too much of this medicine contact a poison control center or emergency room at once. NOTE: This medicine is only for you. Do not share this medicine with others. What if I miss a dose? If you miss a dose, take it as soon as you can. If it is almost time for your  next dose, take only that dose. Do not take double or extra doses. What may interact with this medication? Darbepoetin alfa Methoxy polyethylene glycol-epoetin beta This list may not describe all possible interactions. Give your health care provider a list of all the medicines, herbs, non-prescription drugs, or dietary supplements you use. Also tell them if you smoke, drink alcohol, or use illegal drugs. Some items may interact with your medicine. What should I watch for while using this medication? Visit your care team for regular checks on your  progress. Check your blood pressure as directed. Know what your blood pressure should be and when to contact your care team. Your condition will be monitored carefully while you are receiving this medication. You may need blood work while taking this medication. What side effects may I notice from receiving this medication? Side effects that you should report to your care team as soon as possible: Allergic reactions--skin rash, itching, hives, swelling of the face, lips, tongue, or throat Blood clot--pain, swelling, or warmth in the leg, shortness of breath, chest pain Heart attack--pain or tightness in the chest, shoulders, arms, or jaw, nausea, shortness of breath, cold or clammy skin, feeling faint or lightheaded Increase in blood pressure Rash, fever, and swollen lymph nodes Redness, blistering, peeling, or loosening of the skin, including inside the mouth Seizures Stroke--sudden numbness or weakness of the face, arm, or leg, trouble speaking, confusion, trouble walking, loss of balance or coordination, dizziness, severe headache, change in vision Side effects that usually do not require medical attention (report to your care team if they continue or are bothersome): Bone, joint, or muscle pain Cough Headache Nausea Pain, redness, or irritation at injection site This list may not describe all possible side effects. Call your doctor for medical advice about side effects. You may report side effects to FDA at 1-800-FDA-1088. Where should I keep my medication? Keep out of the reach of children and pets. Store in a refrigerator. Do not freeze. Do not shake. Protect from light. Keep this medication in the original container until you are ready to take it. See product for storage information. Get rid of any unused medication after the expiration date. To get rid of medications that are no longer needed or have expired: Take the medication to a medication take-back program. Check with your  pharmacy or law enforcement to find a location. If you cannot return the medication, ask your pharmacist or care team how to get rid of the medication safely. NOTE: This sheet is a summary. It may not cover all possible information. If you have questions about this medicine, talk to your doctor, pharmacist, or health care provider.  2024 Elsevier/Gold Standard (2021-12-06 00:00:00)    To help prevent nausea and vomiting after your treatment, we encourage you to take your nausea medication as directed.  BELOW ARE SYMPTOMS THAT SHOULD BE REPORTED IMMEDIATELY: *FEVER GREATER THAN 100.4 F (38 C) OR HIGHER *CHILLS OR SWEATING *NAUSEA AND VOMITING THAT IS NOT CONTROLLED WITH YOUR NAUSEA MEDICATION *UNUSUAL SHORTNESS OF BREATH *UNUSUAL BRUISING OR BLEEDING *URINARY PROBLEMS (pain or burning when urinating, or frequent urination) *BOWEL PROBLEMS (unusual diarrhea, constipation, pain near the anus) TENDERNESS IN MOUTH AND THROAT WITH OR WITHOUT PRESENCE OF ULCERS (sore throat, sores in mouth, or a toothache) UNUSUAL RASH, SWELLING OR PAIN  UNUSUAL VAGINAL DISCHARGE OR ITCHING   Items with * indicate a potential emergency and should be followed up as soon as possible or go to the Emergency Department if any problems should  occur.  Please show the CHEMOTHERAPY ALERT CARD or IMMUNOTHERAPY ALERT CARD at check-in to the Emergency Department and triage nurse.  Should you have questions after your visit or need to cancel or reschedule your appointment, please contact [facility] [place] - A DEPT OF [facility] [phone]  and follow the prompts.  Office hours are 8:00 a.m. to 4:30 p.m. Monday - Friday. Please note that voicemails left after 4:00 p.m. may not be returned until the following business day.  We are closed weekends and major holidays. You have access to a nurse at all times for urgent questions. Please call the main number to the clinic [phone] and follow the  prompts.  For any non-urgent questions, you may also contact your provider using MyChart. We now offer e-Visits for anyone 73 and older to request care online for non-urgent symptoms. For details visit [email or web address].   Also download the MyChart app! Go to the app store, search "MyChart", open the app, select [place], and log in with your MyChart username and password.

## 2024-03-29 ENCOUNTER — Encounter (HOSPITAL_COMMUNITY)

## 2024-03-29 ENCOUNTER — Other Ambulatory Visit (HOSPITAL_COMMUNITY)

## 2024-03-31 ENCOUNTER — Ambulatory Visit (HOSPITAL_COMMUNITY)
Admission: RE | Admit: 2024-03-31 | Discharge: 2024-03-31 | Disposition: A | Discharge status: Home / Self Care | Source: Ambulatory Visit | Attending: Physician Assistant | Admitting: Physician Assistant

## 2024-03-31 ENCOUNTER — Encounter (HOSPITAL_COMMUNITY): Payer: Self-pay

## 2024-03-31 ENCOUNTER — Inpatient Hospital Stay: Attending: Physician Assistant

## 2024-03-31 ENCOUNTER — Inpatient Hospital Stay

## 2024-03-31 VITALS — BP 180/55 | HR 61 | Temp 97.2°F | Resp 18

## 2024-03-31 DIAGNOSIS — Z853 Personal history of malignant neoplasm of breast: Secondary | ICD-10-CM

## 2024-03-31 DIAGNOSIS — C50511 Malignant neoplasm of lower-outer quadrant of right female breast: Secondary | ICD-10-CM | POA: Insufficient documentation

## 2024-03-31 DIAGNOSIS — N184 Chronic kidney disease, stage 4 (severe): Secondary | ICD-10-CM | POA: Insufficient documentation

## 2024-03-31 DIAGNOSIS — D631 Anemia in chronic kidney disease: Secondary | ICD-10-CM | POA: Diagnosis not present

## 2024-03-31 DIAGNOSIS — Z170 Estrogen receptor positive status [ER+]: Secondary | ICD-10-CM | POA: Diagnosis not present

## 2024-03-31 DIAGNOSIS — R92333 Mammographic heterogeneous density, bilateral breasts: Secondary | ICD-10-CM | POA: Diagnosis not present

## 2024-03-31 DIAGNOSIS — M8580 Other specified disorders of bone density and structure, unspecified site: Secondary | ICD-10-CM

## 2024-03-31 DIAGNOSIS — D649 Anemia, unspecified: Secondary | ICD-10-CM

## 2024-03-31 LAB — CBC
HCT: 32 % — ABNORMAL LOW (ref 36.0–46.0)
Hemoglobin: 10 g/dL — ABNORMAL LOW (ref 12.0–15.0)
MCH: 27.5 pg (ref 26.0–34.0)
MCHC: 31.3 g/dL (ref 30.0–36.0)
MCV: 88.2 fL (ref 80.0–100.0)
Platelets: 84 K/uL — ABNORMAL LOW (ref 150–400)
RBC: 3.63 MIL/uL — ABNORMAL LOW (ref 3.87–5.11)
RDW: 17.6 % — ABNORMAL HIGH (ref 11.5–15.5)
WBC: 2.4 K/uL — ABNORMAL LOW (ref 4.0–10.5)
nRBC: 0 % (ref 0.0–0.2)

## 2024-03-31 LAB — SAMPLE TO BLOOD BANK

## 2024-03-31 MED ORDER — CLONIDINE HCL 0.1 MG PO TABS
0.2000 mg | ORAL_TABLET | Freq: Once | ORAL | Status: DC
  Filled 2024-03-31: qty 2

## 2024-03-31 MED ORDER — SODIUM CHLORIDE 0.9 % IV SOLN: Freq: Once | INTRAVENOUS | Status: DC

## 2024-03-31 MED ORDER — EPOETIN ALFA-EPBX 40000 UNIT/ML IJ SOLN
40000.0000 [IU] | Freq: Once | INTRAMUSCULAR | Status: AC
  Administered 2024-03-31: 40000 [IU] via SUBCUTANEOUS
  Filled 2024-03-31: qty 1

## 2024-03-31 NOTE — Patient Instructions (Signed)
 [facility] [place] - A [facility]PT [facility]  Discharge Instructions: Thank you for choosing  Cancer Center to provide your oncology and hematology care.  If you have a lab appointment with the Cancer Center - please note that after April 8th, 2024, all labs will be drawn in the cancer center.  You do not have to check in or register with the main entrance as you have in the past but will complete your check-in in the cancer center.  Wear comfortable clothing and clothing appropriate for easy access to any Portacath or PICC line.   We strive to give you quality time with your provider. You may need to reschedule your appointment if you arrive late (15 or more minutes).  Arriving late affects you and other patients whose appointments are after yours.  Also, if you miss three or more appointments without notifying the office, you may be dismissed from the clinic at the provider's discretion.      For prescription refill requests, have your pharmacy contact our office and allow 72 hours for refills to be completed.    Today you received the following chemotherapy and/or immunotherapy agents Retacrit. Epoetin Alfa Injection What is this medication? EPOETIN ALFA (e POE e tin AL fa) treats low levels of red blood cells (anemia) caused by kidney disease, chemotherapy, or HIV medications. It can also be used in people who are at risk for blood loss during surgery. It works by Systems analyst make more red blood cells, which reduces the need for blood transfusions. This medicine may be used for other purposes; ask your health care provider or pharmacist if you have questions. COMMON BRAND NAME(S): Epogen, Procrit, Retacrit What should I tell my care team before I take this medication? They need to know if you have any of these conditions: Blood clots Cancer Heart disease High blood pressure On dialysis Seizures Stroke An unusual or allergic reaction to epoetin  alfa, albumin, benzyl alcohol, other medications, foods, dyes, or preservatives Pregnant or trying to get pregnant Breast-feeding How should I use this medication? This medication is injected into a vein or under the skin. It is usually given by your care team in a hospital or clinic setting. It may also be given at home. If you get this medication at home, you will be taught how to prepare and give it. Use exactly as directed. Take it as directed on the prescription label at the same time every day. Keep taking it unless your care team tells you to stop. It is important that you put your used needles and syringes in a special sharps container. Do not put them in a trash can. If you do not have a sharps container, call your pharmacist or care team to get one. A special MedGuide will be given to you by the pharmacist with each prescription and refill. Be sure to read this information carefully each time. Talk to your care team about the use of this medication in children. While this medication may be used in children as young as 1 month of age for selected conditions, precautions do apply. Overdosage: If you think you have taken too much of this medicine contact a poison control center or emergency room at once. NOTE: This medicine is only for you. Do not share this medicine with others. What if I miss a dose? If you miss a dose, take it as soon as you can. If it is almost time for  your next dose, take only that dose. Do not take double or extra doses. What may interact with this medication? Darbepoetin alfa Methoxy polyethylene glycol-epoetin beta This list may not describe all possible interactions. Give your health care provider a list of all the medicines, herbs, non-prescription drugs, or dietary supplements you use. Also tell them if you smoke, drink alcohol, or use illegal drugs. Some items may interact with your medicine. What should I watch for while using this medication? Visit your care  team for regular checks on your progress. Check your blood pressure as directed. Know what your blood pressure should be and when to contact your care team. Your condition will be monitored carefully while you are receiving this medication. You may need blood work while taking this medication. What side effects may I notice from receiving this medication? Side effects that you should report to your care team as soon as possible: Allergic reactions--skin rash, itching, hives, swelling of the face, lips, tongue, or throat Blood clot--pain, swelling, or warmth in the leg, shortness of breath, chest pain Heart attack--pain or tightness in the chest, shoulders, arms, or jaw, nausea, shortness of breath, cold or clammy skin, feeling faint or lightheaded Increase in blood pressure Rash, fever, and swollen lymph nodes Redness, blistering, peeling, or loosening of the skin, including inside the mouth Seizures Stroke--sudden numbness or weakness of the face, arm, or leg, trouble speaking, confusion, trouble walking, loss of balance or coordination, dizziness, severe headache, change in vision Side effects that usually do not require medical attention (report to your care team if they continue or are bothersome): Bone, joint, or muscle pain Cough Headache Nausea Pain, redness, or irritation at injection site This list may not describe all possible side effects. Call your doctor for medical advice about side effects. You may report side effects to FDA at 1-800-FDA-1088. Where should I keep my medication? Keep out of the reach of children and pets. Store in a refrigerator. Do not freeze. Do not shake. Protect from light. Keep this medication in the original container until you are ready to take it. See product for storage information. Get rid of any unused medication after the expiration date. To get rid of medications that are no longer needed or have expired: Take the medication to a medication take-back  program. Check with your pharmacy or law enforcement to find a location. If you cannot return the medication, ask your pharmacist or care team how to get rid of the medication safely. NOTE: This sheet is a summary. It may not cover all possible information. If you have questions about this medicine, talk to your doctor, pharmacist, or health care provider.  2024 Elsevier/Gold Standard (2021-12-06 00:00:00)       To help prevent nausea and vomiting after your treatment, we encourage you to take your nausea medication as directed.  BELOW ARE SYMPTOMS THAT SHOULD BE REPORTED IMMEDIATELY: *FEVER GREATER THAN 100.4 F (38 C) OR HIGHER *CHILLS OR SWEATING *NAUSEA AND VOMITING THAT IS NOT CONTROLLED WITH YOUR NAUSEA MEDICATION *UNUSUAL SHORTNESS OF BREATH *UNUSUAL BRUISING OR BLEEDING *URINARY PROBLEMS (pain or burning when urinating, or frequent urination) *BOWEL PROBLEMS (unusual diarrhea, constipation, pain near the anus) TENDERNESS IN MOUTH AND THROAT WITH OR WITHOUT PRESENCE OF ULCERS (sore throat, sores in mouth, or a toothache) UNUSUAL RASH, SWELLING OR PAIN  UNUSUAL VAGINAL DISCHARGE OR ITCHING   Items with * indicate a potential emergency and should be followed up as soon as possible or go to the Emergency Department  if any problems should occur.  Please show the CHEMOTHERAPY ALERT CARD or IMMUNOTHERAPY ALERT CARD at check-in to the Emergency Department and triage nurse.  Should you have questions after your visit or need to cancel or reschedule your appointment, please contact [facility] [place] - A DEPT OF [facility] [phone]  and follow the prompts.  Office hours are 8:00 a.m. to 4:30 p.m. Monday - Friday. Please note that voicemails left after 4:00 p.m. may not be returned until the following business day.  We are closed weekends and major holidays. You have access to a nurse at all times for urgent questions. Please call the main number to the clinic  [phone] and follow the prompts.  For any non-urgent questions, you may also contact your provider using MyChart. We now offer e-Visits for anyone 88 and older to request care online for non-urgent symptoms. For details visit [email or web address].   Also download the MyChart app! Go to the app store, search "MyChart", open the app, select [place], and log in with your MyChart username and password.

## 2024-03-31 NOTE — Progress Notes (Signed)
 HGB 10.0. Patient presents today for Retacrit injection.   Patient's blood pressure 185/50. Standing orders followed. Message sent to DOROTHA Hope NP and blood pressure reported. Message received to give 0.2 mg of Clonidine PO x 1 dose. Monitor 30  minutes and recheck blood pressure and give Retacrit. Patient refused Clonidine. Patient education given pertaining to blood pressure and blood pressure medication administration. Retacrit side effects pertaining to increased blood pressure and education on parameters. Understanding verbalized.   Alysa B Sliva presents today for injection per the provider's orders.  Retacrit administration without incident; injection site WNL; see MAR for injection details.  Patient tolerated procedure well and without incident.  No questions or complaints noted at this time.   Discharged from clinic by wheel chair in stable condition. Alert and oriented x 3. F/U with [facility] as scheduled.

## 2024-04-05 ENCOUNTER — Other Ambulatory Visit: Payer: Self-pay | Admitting: Family Medicine

## 2024-04-11 DIAGNOSIS — H43813 Vitreous degeneration, bilateral: Secondary | ICD-10-CM | POA: Diagnosis not present

## 2024-04-11 DIAGNOSIS — H11823 Conjunctivochalasis, bilateral: Secondary | ICD-10-CM | POA: Diagnosis not present

## 2024-04-11 DIAGNOSIS — H2513 Age-related nuclear cataract, bilateral: Secondary | ICD-10-CM | POA: Diagnosis not present

## 2024-04-11 DIAGNOSIS — H353121 Nonexudative age-related macular degeneration, left eye, early dry stage: Secondary | ICD-10-CM | POA: Diagnosis not present

## 2024-04-11 DIAGNOSIS — H40033 Anatomical narrow angle, bilateral: Secondary | ICD-10-CM | POA: Diagnosis not present

## 2024-04-12 ENCOUNTER — Other Ambulatory Visit: Payer: Self-pay | Admitting: Family Medicine

## 2024-04-12 DIAGNOSIS — E034 Atrophy of thyroid (acquired): Secondary | ICD-10-CM

## 2024-04-14 ENCOUNTER — Inpatient Hospital Stay

## 2024-04-14 VITALS — BP 187/55 | HR 55 | Temp 97.8°F | Resp 16

## 2024-04-14 DIAGNOSIS — M8580 Other specified disorders of bone density and structure, unspecified site: Secondary | ICD-10-CM

## 2024-04-14 DIAGNOSIS — N184 Chronic kidney disease, stage 4 (severe): Secondary | ICD-10-CM | POA: Diagnosis not present

## 2024-04-14 DIAGNOSIS — D649 Anemia, unspecified: Secondary | ICD-10-CM

## 2024-04-14 LAB — SAMPLE TO BLOOD BANK

## 2024-04-14 LAB — CBC
HCT: 33.3 % — ABNORMAL LOW (ref 36.0–46.0)
Hemoglobin: 10.4 g/dL — ABNORMAL LOW (ref 12.0–15.0)
MCH: 28 pg (ref 26.0–34.0)
MCHC: 31.2 g/dL (ref 30.0–36.0)
MCV: 89.8 fL (ref 80.0–100.0)
Platelets: 85 K/uL — ABNORMAL LOW (ref 150–400)
RBC: 3.71 MIL/uL — ABNORMAL LOW (ref 3.87–5.11)
RDW: 17.3 % — ABNORMAL HIGH (ref 11.5–15.5)
WBC: 2.2 K/uL — ABNORMAL LOW (ref 4.0–10.5)
nRBC: 0 % (ref 0.0–0.2)

## 2024-04-14 MED ORDER — EPOETIN ALFA-EPBX 40000 UNIT/ML IJ SOLN
40000.0000 [IU] | Freq: Once | INTRAMUSCULAR | Status: AC
  Administered 2024-04-14: 40000 [IU] via SUBCUTANEOUS
  Filled 2024-04-14: qty 1

## 2024-04-14 NOTE — Progress Notes (Signed)
 Blood pressure is 187/55 today, ok to proceed with retacrit  per Delon Hope NP  Retacrit  injection given per orders. Patient tolerated it well without problems. Vitals stable and discharged home from clinic via wheelchair. Follow up as scheduled.

## 2024-04-14 NOTE — Patient Instructions (Signed)
 [facility] [place] - A DEPT OF [facility]  Discharge Instructions: Thank you for choosing [facility] to provide your oncology and hematology care.  If you have a lab appointment with the Cancer Center - please note that after April 8th, 2024, all labs will be drawn in the cancer center.  You do not have to check in or register with the main entrance as you have in the past but will complete your check-in in the cancer center.  Wear comfortable clothing and clothing appropriate for easy access to any Portacath or PICC line.   We strive to give you quality time with your provider. You may need to reschedule your appointment if you arrive late (15 or more minutes).  Arriving late affects you and other patients whose appointments are after yours.  Also, if you miss three or more appointments without notifying the office, you may be dismissed from the clinic at the provider's discretion.      For prescription refill requests, have your pharmacy contact our office and allow 72 hours for refills to be completed.    Today you received the following retacrit injection   To help prevent nausea and vomiting after your treatment, we encourage you to take your nausea medication as directed.  BELOW ARE SYMPTOMS THAT SHOULD BE REPORTED IMMEDIATELY: *FEVER GREATER THAN 100.4 F (38 C) OR HIGHER *CHILLS OR SWEATING *NAUSEA AND VOMITING THAT IS NOT CONTROLLED WITH YOUR NAUSEA MEDICATION *UNUSUAL SHORTNESS OF BREATH *UNUSUAL BRUISING OR BLEEDING *URINARY PROBLEMS (pain or burning when urinating, or frequent urination) *BOWEL PROBLEMS (unusual diarrhea, constipation, pain near the anus) TENDERNESS IN MOUTH AND THROAT WITH OR WITHOUT PRESENCE OF ULCERS (sore throat, sores in mouth, or a toothache) UNUSUAL RASH, SWELLING OR PAIN  UNUSUAL VAGINAL DISCHARGE OR ITCHING   Items with * indicate a potential emergency and should be followed up as soon as possible or go to the  Emergency Department if any problems should occur.  Please show the CHEMOTHERAPY ALERT CARD or IMMUNOTHERAPY ALERT CARD at check-in to the Emergency Department and triage nurse.  Should you have questions after your visit or need to cancel or reschedule your appointment, please contact [facility] [place] - A DEPT OF Eligha Brideg[facility] [phone]  and follow the prompts.  Office hours are 8:00 a.m. to 4:30 p.m. Monday - Friday. Please note that voicemails left after 4:00 p.m. may not be returned until the following business day.  We are closed weekends and major holidays. You have access to a nurse at all times for urgent questions. Please call the main number to the clinic [phone] and follow the prompts.  For any non-urgent questions, you may also contact your provider using MyChart. We now offer e-Visits for anyone 15 and older to request care online for non-urgent symptoms. For details visit [email or web address].   Also download the MyChart app! Go to the app store, search "MyChart", open the app, select [place], and log in with your MyChart username and password.

## 2024-04-22 ENCOUNTER — Other Ambulatory Visit: Payer: Self-pay | Admitting: Gastroenterology

## 2024-04-22 DIAGNOSIS — R630 Anorexia: Secondary | ICD-10-CM

## 2024-04-22 NOTE — Telephone Encounter (Signed)
 Sending in refills. Needs a non-urgent follow-up visit.

## 2024-04-28 ENCOUNTER — Inpatient Hospital Stay

## 2024-04-28 ENCOUNTER — Inpatient Hospital Stay: Attending: Physician Assistant

## 2024-04-28 VITALS — BP 151/50 | HR 59 | Temp 96.9°F | Resp 18

## 2024-04-28 DIAGNOSIS — Z79899 Other long term (current) drug therapy: Secondary | ICD-10-CM | POA: Diagnosis not present

## 2024-04-28 DIAGNOSIS — D631 Anemia in chronic kidney disease: Secondary | ICD-10-CM | POA: Insufficient documentation

## 2024-04-28 DIAGNOSIS — N184 Chronic kidney disease, stage 4 (severe): Secondary | ICD-10-CM | POA: Insufficient documentation

## 2024-04-28 DIAGNOSIS — M8580 Other specified disorders of bone density and structure, unspecified site: Secondary | ICD-10-CM

## 2024-04-28 DIAGNOSIS — D649 Anemia, unspecified: Secondary | ICD-10-CM

## 2024-04-28 LAB — SAMPLE TO BLOOD BANK

## 2024-04-28 LAB — CBC
HCT: 32.8 % — ABNORMAL LOW (ref 36.0–46.0)
Hemoglobin: 10.3 g/dL — ABNORMAL LOW (ref 12.0–15.0)
MCH: 28.1 pg (ref 26.0–34.0)
MCHC: 31.4 g/dL (ref 30.0–36.0)
MCV: 89.6 fL (ref 80.0–100.0)
Platelets: 89 K/uL — ABNORMAL LOW (ref 150–400)
RBC: 3.66 MIL/uL — ABNORMAL LOW (ref 3.87–5.11)
RDW: 16.7 % — ABNORMAL HIGH (ref 11.5–15.5)
WBC: 2.2 K/uL — ABNORMAL LOW (ref 4.0–10.5)
nRBC: 0 % (ref 0.0–0.2)

## 2024-04-28 MED ORDER — EPOETIN ALFA-EPBX 40000 UNIT/ML IJ SOLN
40000.0000 [IU] | Freq: Once | INTRAMUSCULAR | Status: AC
  Administered 2024-04-28: 40000 [IU] via SUBCUTANEOUS
  Filled 2024-04-28: qty 1

## 2024-04-28 NOTE — Patient Instructions (Signed)

## 2024-04-28 NOTE — Progress Notes (Signed)
Patient presents today for Retacrit injection. Hemoglobin reviewed prior to administration. VSS tolerated without incident or complaint. See MAR for details. Patient stable during and after injection. Patient discharged in satisfactory condition with no s/s of distress noted.  

## 2024-05-06 ENCOUNTER — Other Ambulatory Visit: Payer: Self-pay | Admitting: Family Medicine

## 2024-05-06 DIAGNOSIS — I10 Essential (primary) hypertension: Secondary | ICD-10-CM

## 2024-05-12 ENCOUNTER — Inpatient Hospital Stay

## 2024-05-12 VITALS — BP 174/56 | HR 63 | Temp 98.5°F | Resp 16

## 2024-05-12 DIAGNOSIS — D649 Anemia, unspecified: Secondary | ICD-10-CM

## 2024-05-12 DIAGNOSIS — N184 Chronic kidney disease, stage 4 (severe): Secondary | ICD-10-CM | POA: Diagnosis not present

## 2024-05-12 DIAGNOSIS — M8580 Other specified disorders of bone density and structure, unspecified site: Secondary | ICD-10-CM

## 2024-05-12 LAB — COMPREHENSIVE METABOLIC PANEL WITH GFR
ALT: 9 U/L (ref 0–44)
AST: 18 U/L (ref 15–41)
Albumin: 3.5 g/dL (ref 3.5–5.0)
Alkaline Phosphatase: 42 U/L (ref 38–126)
Anion gap: 11 (ref 5–15)
BUN: 37 mg/dL — ABNORMAL HIGH (ref 8–23)
CO2: 33 mmol/L — ABNORMAL HIGH (ref 22–32)
Calcium: 8.9 mg/dL (ref 8.9–10.3)
Chloride: 91 mmol/L — ABNORMAL LOW (ref 98–111)
Creatinine, Ser: 1.64 mg/dL — ABNORMAL HIGH (ref 0.44–1.00)
GFR, Estimated: 30 mL/min — ABNORMAL LOW (ref >60)
Glucose, Bld: 162 mg/dL — ABNORMAL HIGH (ref 70–99)
Potassium: 3.5 mmol/L (ref 3.5–5.1)
Sodium: 135 mmol/L (ref 135–145)
Total Bilirubin: 1.2 mg/dL (ref 0.0–1.2)
Total Protein: 6.7 g/dL (ref 6.5–8.1)

## 2024-05-12 LAB — CBC
HCT: 30.7 % — ABNORMAL LOW (ref 36.0–46.0)
Hemoglobin: 9.6 g/dL — ABNORMAL LOW (ref 12.0–15.0)
MCH: 28.2 pg (ref 26.0–34.0)
MCHC: 31.3 g/dL (ref 30.0–36.0)
MCV: 90.3 fL (ref 80.0–100.0)
Platelets: 69 K/uL — ABNORMAL LOW (ref 150–400)
RBC: 3.4 MIL/uL — ABNORMAL LOW (ref 3.87–5.11)
RDW: 16.9 % — ABNORMAL HIGH (ref 11.5–15.5)
WBC: 4 K/uL (ref 4.0–10.5)
nRBC: 0 % (ref 0.0–0.2)

## 2024-05-12 LAB — SAMPLE TO BLOOD BANK

## 2024-05-12 MED ORDER — DENOSUMAB 60 MG/ML ~~LOC~~ SOSY
60.0000 mg | PREFILLED_SYRINGE | Freq: Once | SUBCUTANEOUS | Status: AC
  Administered 2024-05-12: 60 mg via SUBCUTANEOUS
  Filled 2024-05-12: qty 1

## 2024-05-12 MED ORDER — EPOETIN ALFA-EPBX 40000 UNIT/ML IJ SOLN
40000.0000 [IU] | Freq: Once | INTRAMUSCULAR | Status: AC
  Administered 2024-05-12: 40000 [IU] via SUBCUTANEOUS
  Filled 2024-05-12: qty 1

## 2024-05-12 NOTE — Patient Instructions (Signed)
 [facility] [place] - A DEPT OF [place]  Discharge Instructions: Thank you for choosing [facility] to provide your oncology and hematology care.  If you have a lab appointment with the Cancer Center - please note that after April 8th, 2024, all labs will be drawn in the cancer center.  You do not have to check in or register with the main entrance as you have in the past but will complete your check-in in the cancer center.  Wear comfortable clothing and clothing appropriate for easy access to any Portacath or PICC line.   We strive to give you quality time with your provider. You may need to reschedule your appointment if you arrive late (15 or more minutes).  Arriving late affects you and other patients whose appointments are after yours.  Also, if you miss three or more appointments without notifying the office, you may be dismissed from the clinic at the provider's discretion.      For prescription refill requests, have your pharmacy contact our office and allow 72 hours for refills to be completed.    Today you received the following chemotherapy and/or immunotherapy agents Prolia  Retacrit       To help prevent nausea and vomiting after your treatment, we encourage you to take your nausea medication as directed.  BELOW ARE SYMPTOMS THAT SHOULD BE REPORTED IMMEDIATELY: *FEVER GREATER THAN 100.4 F (38 C) OR HIGHER *CHILLS OR SWEATING *NAUSEA AND VOMITING THAT IS NOT CONTROLLED WITH YOUR NAUSEA MEDICATION *UNUSUAL SHORTNESS OF BREATH *UNUSUAL BRUISING OR BLEEDING *URINARY PROBLEMS (pain or burning when urinating, or frequent urination) *BOWEL PROBLEMS (unusual diarrhea, constipation, pain near the anus) TENDERNESS IN MOUTH AND THROAT WITH OR WITHOUT PRESENCE OF ULCERS (sore throat, sores in mouth, or a toothache) UNUSUAL RASH, SWELLING OR PAIN  UNUSUAL VAGINAL DISCHARGE OR ITCHING   Items with * indicate a potential emergency and should be  followed up as soon as possible or go to the Emergency Department if any problems should occur.  Please show the CHEMOTHERAPY ALERT CARD or IMMUNOTHERAPY ALERT CARD at check-in to the Emergency Department and triage nurse.  Should you have questions after your visit or need to cancel or reschedule your appointment, please contact [facility] [place] - A DEPT OF [facility] [phone]  and follow the prompts.  Office hours are 8:00 a.m. to 4:30 p.m. Monday - Friday. Please note that voicemails left after 4:00 p.m. may not be returned until the following business day.  We are closed weekends and major holidays. You have access to a nurse at all times for urgent questions. Please call the main number to the clinic [phone] and follow the prompts.  For any non-urgent questions, you may also contact your provider using MyChart. We now offer e-Visits for anyone 71 and older to request care online for non-urgent symptoms. For details visit [email or web address].   Also download the MyChart app! Go to the app store, search MyChart, open the app, select [place], and log in with your MyChart username and password.

## 2024-05-12 NOTE — Progress Notes (Signed)
 Patient presents today for Retacrit  and Prolia  injections per providers order.  Vital signs WNL.  Hgb noted to be 9.6 and calcium    Patient has been taking Calcium  and vitamin D  supplements, has had no prior or upcoming dental work and no jaw pain.  Stable during administration without incident; injection site WNL; see MAR for injection details.  Patient tolerated procedure well and without incident.  No questions or complaints noted at this time.

## 2024-05-13 DIAGNOSIS — I10 Essential (primary) hypertension: Secondary | ICD-10-CM | POA: Diagnosis not present

## 2024-05-17 ENCOUNTER — Ambulatory Visit: Admitting: Family Medicine

## 2024-05-19 ENCOUNTER — Other Ambulatory Visit: Payer: Self-pay | Admitting: Cardiology

## 2024-05-19 ENCOUNTER — Inpatient Hospital Stay

## 2024-05-24 ENCOUNTER — Encounter: Payer: Self-pay | Admitting: Family Medicine

## 2024-05-24 ENCOUNTER — Ambulatory Visit: Admitting: Family Medicine

## 2024-05-24 VITALS — BP 142/48 | HR 60 | Temp 98.1°F | Ht 65.0 in | Wt 109.2 lb

## 2024-05-24 DIAGNOSIS — N184 Chronic kidney disease, stage 4 (severe): Secondary | ICD-10-CM

## 2024-05-24 DIAGNOSIS — J9611 Chronic respiratory failure with hypoxia: Secondary | ICD-10-CM | POA: Diagnosis not present

## 2024-05-24 DIAGNOSIS — Z23 Encounter for immunization: Secondary | ICD-10-CM

## 2024-05-24 DIAGNOSIS — I10 Essential (primary) hypertension: Secondary | ICD-10-CM

## 2024-05-24 DIAGNOSIS — E890 Postprocedural hypothyroidism: Secondary | ICD-10-CM | POA: Diagnosis not present

## 2024-05-24 NOTE — Progress Notes (Signed)
 Subjective: CC: Hypothyroidism PCP: Jolinda Janet HERO, DO YEP:[name] is a 89 y.o. female presenting to clinic today for:  Hypothyroidism Patient reports compliance with her Synthroid .  She does report that the tablet has changed shape since we saw each other last.  She denies any concerning symptomology including heart palpitations, tremor, edema, changes in bowel habits.  Her appetite continues to be fair.  She often just does not have a taste for the things that she used to enjoy but does still make herself to have scheduled meals.  Her breathing is stable and she continues to use oxygen  as directed.  She is compliant with all recommendations by her cardiologist.  Denies any chest pain   ROS: Per HPI  Allergies  Allergen Reactions   Norvasc  [Amlodipine ] Swelling    Edema    Tape Other (See Comments)    Has to have no stick tape.   Zestril  [Lisinopril ] Cough   Hytrin  [Terazosin ] Hives and Nausea Only   Imdur  [Isosorbide  Nitrate] Nausea Only   Sulfonamide Derivatives Nausea And Vomiting and Rash   Past Medical History:  Diagnosis Date   Anemia    Arthritis    Asthma    Atrial fibrillation (HCC)    Basal cell carcinoma 02/06/2009   Left ear targus- (MOHS)   Basal cell carcinoma 09/28/2008   Right back-(CX35FU)   Breast cancer (HCC) 2019   invasive and ductal ca   Breast cancer, right (HCC) 04/08/2018   CKD (chronic kidney disease), stage III (HCC)    COPD (chronic obstructive pulmonary disease) (HCC)    Duodenal ulcer 10/01/2021   Essential hypertension, malignant    Heart murmur    Hyperlipidemia    Hypertension    Hyponatremia 10/14/2015   Hypothyroidism    Nodule of right lung    Right upper lobe   Osteopenia due to cancer therapy 10/07/2022   Pneumonia    PONV (postoperative nausea and vomiting)    Renal insufficiency    Chronic   Renal vascular disease    Right renal artery stenosis 05/09/2015   Squamous cell carcinoma of skin 09/07/2019    KA-Left shin-txpbx   Tear of lateral meniscus of knee 10/27/2017   UGI bleed 08/31/2021   Vertigo     Current Outpatient Medications:    albuterol  (VENTOLIN  HFA) 108 (90 Base) MCG/ACT inhaler, Inhale 2 puffs into the lungs every 6 (six) hours as needed for wheezing or shortness of breath., Disp: 8 g, Rfl: 6   atorvastatin  (LIPITOR) 20 MG tablet, TAKE 1/2 TABLET BY MOUTH DAILY, Disp: 45 tablet, Rfl: 0   bisoprolol  (ZEBETA ) 5 MG tablet, TAKE 1 TABLET (5 MG TOTAL) BY MOUTH DAILY., Disp: 90 tablet, Rfl: 0   cloNIDine  (CATAPRES ) 0.1 MG tablet, Take 1 tablet (0.1 mg total) by mouth as directed. Take 1 tablet (0.1 mg total) by mouth daily as needed for systolic blood pressure greater than 180. (Patient taking differently: Take 0.1 mg by mouth as needed. Take 1 tablet (0.1 mg total) by mouth daily as needed for systolic blood pressure greater than 180.), Disp: 20 tablet, Rfl: 2   feeding supplement (ENSURE ENLIVE / ENSURE PLUS) LIQD, Take 237 mLs by mouth 2 (two) times daily between meals. (Patient taking differently: Take 237 mLs by mouth daily as needed (decreased appetite).), Disp: 237 mL, Rfl: 12   ferrous sulfate  325 (65 FE) MG tablet, Take 325 mg by mouth daily with breakfast., Disp: , Rfl:    flecainide  (TAMBOCOR ) 50  MG tablet, TAKE 1 TABLET BY MOUTH TWICE A DAY, Disp: 180 tablet, Rfl: 1   hydrALAZINE  (APRESOLINE ) 50 MG tablet, TAKE 1 TABLET BY MOUTH TWICE A DAY, Disp: 180 tablet, Rfl: 3   levothyroxine  (SYNTHROID ) 75 MCG tablet, TAKE 1 TABLET BY MOUTH EVERY DAY, Disp: 90 tablet, Rfl: 0   mirtazapine  (REMERON ) 7.5 MG tablet, TAKE 1 TABLET BY MOUTH EVERYDAY AT BEDTIME, Disp: 90 tablet, Rfl: 0   Multiple Vitamins-Minerals (MULTIVITAMIN WOMEN 50+) TABS, Take 1 tablet by mouth at bedtime., Disp: , Rfl:    pantoprazole  (PROTONIX ) 40 MG tablet, TAKE 1 TABLET BY MOUTH EVERY DAY, Disp: 90 tablet, Rfl: 1   torsemide  (DEMADEX ) 20 MG tablet, TAKE 1 TABLET BY MOUTH TWICE A DAY, Disp: 180 tablet, Rfl: 0    Fluticasone -Umeclidin-Vilant (TRELEGY ELLIPTA ) 100-62.5-25 MCG/ACT AEPB, Inhale 1 puff into the lungs daily. (Patient not taking: Reported on 05/24/2024), Disp: 60 each, Rfl: 5   lidocaine  (LIDODERM ) 5 %, Place 1 patch onto the skin daily. Remove & Discard patch within 12 hours or as directed by MD (Patient not taking: Reported on 05/24/2024), Disp: 30 patch, Rfl: 0 Social History   Socioeconomic History   Marital status: Widowed    Spouse name: Not on file   Number of children: 2   Years of education: 43   Highest education level: Not on file  Occupational History   Occupation: Retired  Tobacco Use   Smoking status: Never   Smokeless tobacco: Never  Vaping Use   Vaping status: Never Used  Substance and Sexual Activity   Alcohol  use: No   Drug use: No   Sexual activity: Not Currently  Other Topics Concern   Not on file  Social History Narrative   Lives alone, but her daughter stays there at night   Ambidextrous (writes with left hand).   No caffeine use.   Social Drivers of Corporate investment banker Strain: Low Risk  (12/18/2023)   Overall Financial Resource Strain (CARDIA)    Difficulty of Paying Living Expenses: Not hard at all  Food Insecurity: No Food Insecurity (12/18/2023)   Hunger Vital Sign    Worried About Running Out of Food in the Last Year: Never true    Ran Out of Food in the Last Year: Never true  Transportation Needs: No Transportation Needs (12/18/2023)   PRAPARE - Administrator, Civil Service (Medical): No    Lack of Transportation (Non-Medical): No  Physical Activity: Inactive (12/18/2023)   Exercise Vital Sign    Days of Exercise per Week: 0 days    Minutes of Exercise per Session: 0 min  Stress: No Stress Concern Present (12/18/2023)   [employer] of Occupational Health - Occupational Stress Questionnaire    Feeling of Stress : Not at all  Social Connections: Socially Isolated (12/18/2023)   Social Connection and Isolation Panel    Frequency  of Communication with Friends and Family: More than three times a week    Frequency of Social Gatherings with Friends and Family: More than three times a week    Attends Religious Services: Never    Database administrator or Organizations: No    Attends Banker Meetings: Never    Marital Status: Widowed  Intimate Partner Violence: Not At Risk (12/18/2023)   Humiliation, Afraid, Rape, and Kick questionnaire    Fear of Current or Ex-Partner: No    Emotionally Abused: No    Physically Abused: No    Sexually  Abused: No   Family History  Problem Relation Age of Onset   Thyroid  disease Mother    Stroke Father    Hypertension Brother    Lung cancer Maternal Uncle    Cancer Maternal Grandmother        Liver   Lung cancer Maternal Uncle    Hypertension Daughter    Hypercholesterolemia Daughter    Hypercholesterolemia Daughter     Objective: Office vital signs reviewed. BP (!) 158/54   Pulse 60   Temp 98.1 F (36.7 C)   Ht 5' 5 (1.651 m)   Wt 109 lb 4 oz (49.6 kg)   SpO2 92%   BMI 18.18 kg/m   Physical Examination:  General: Awake, alert, thin, chronically ill-appearing female, No acute distress HEENT: Sclera white.  Moist mucous membranes.  No exophthalmos Cardio: regular rate and rhythm, S1S2 heard, loud cardiac murmurs appreciated Pulm: clear to auscultation bilaterally, no wheezes, rhonchi or rales; normal work of breathing on room air Extremities: warm, well perfused, No edema, cyanosis or clubbing; she has quite a bit of hemosiderin deposition along the bilateral anterior lower legs    Assessment/ Plan: 89 y.o. female   Postoperative hypothyroidism - Plan: TSH + free T4, CANCELED: TSH + free T4  Stage 4 chronic kidney disease (HCC)  Essential hypertension  Chronic respiratory failure with hypoxia (HCC)  Encounter for immunization - Plan: Flu vaccine HIGH DOSE PF(Fluzone Trivalent)   Check thyroid  levels with her next lab draw with  hematology/oncology  Her renal disease is borderline stage IIIb/IV with most recent GFR at 30.  We reviewed her labs together today.  Her blood pressure is in acceptable range for her age.  Breathing is stable on oxygen .  Reinforced need for compliance with Trelegy  Influenza vaccination administered   Janet CHRISTELLA [name], DO [facility] [phone]

## 2024-05-26 ENCOUNTER — Inpatient Hospital Stay: Attending: Physician Assistant

## 2024-05-26 ENCOUNTER — Inpatient Hospital Stay

## 2024-05-26 VITALS — BP 162/49 | HR 56 | Temp 97.5°F | Resp 18

## 2024-05-26 DIAGNOSIS — C50511 Malignant neoplasm of lower-outer quadrant of right female breast: Secondary | ICD-10-CM | POA: Insufficient documentation

## 2024-05-26 DIAGNOSIS — Z170 Estrogen receptor positive status [ER+]: Secondary | ICD-10-CM | POA: Diagnosis not present

## 2024-05-26 DIAGNOSIS — Z79899 Other long term (current) drug therapy: Secondary | ICD-10-CM | POA: Diagnosis not present

## 2024-05-26 DIAGNOSIS — D631 Anemia in chronic kidney disease: Secondary | ICD-10-CM | POA: Diagnosis not present

## 2024-05-26 DIAGNOSIS — D61818 Other pancytopenia: Secondary | ICD-10-CM | POA: Diagnosis not present

## 2024-05-26 DIAGNOSIS — I130 Hypertensive heart and chronic kidney disease with heart failure and stage 1 through stage 4 chronic kidney disease, or unspecified chronic kidney disease: Secondary | ICD-10-CM | POA: Diagnosis not present

## 2024-05-26 DIAGNOSIS — N184 Chronic kidney disease, stage 4 (severe): Secondary | ICD-10-CM | POA: Diagnosis not present

## 2024-05-26 DIAGNOSIS — D649 Anemia, unspecified: Secondary | ICD-10-CM

## 2024-05-26 DIAGNOSIS — E538 Deficiency of other specified B group vitamins: Secondary | ICD-10-CM | POA: Insufficient documentation

## 2024-05-26 DIAGNOSIS — D500 Iron deficiency anemia secondary to blood loss (chronic): Secondary | ICD-10-CM

## 2024-05-26 DIAGNOSIS — D696 Thrombocytopenia, unspecified: Secondary | ICD-10-CM

## 2024-05-26 DIAGNOSIS — M85851 Other specified disorders of bone density and structure, right thigh: Secondary | ICD-10-CM | POA: Diagnosis not present

## 2024-05-26 DIAGNOSIS — M8580 Other specified disorders of bone density and structure, unspecified site: Secondary | ICD-10-CM

## 2024-05-26 LAB — CBC WITH DIFFERENTIAL/PLATELET
Abs Immature Granulocytes: 0.01 K/uL (ref 0.00–0.07)
Basophils Absolute: 0 K/uL (ref 0.0–0.1)
Basophils Relative: 0 %
Eosinophils Absolute: 0 K/uL (ref 0.0–0.5)
Eosinophils Relative: 1 %
HCT: 30.2 % — ABNORMAL LOW (ref 36.0–46.0)
Hemoglobin: 9.3 g/dL — ABNORMAL LOW (ref 12.0–15.0)
Immature Granulocytes: 0 %
Lymphocytes Relative: 15 %
Lymphs Abs: 0.4 K/uL — ABNORMAL LOW (ref 0.7–4.0)
MCH: 27.4 pg (ref 26.0–34.0)
MCHC: 30.8 g/dL (ref 30.0–36.0)
MCV: 88.8 fL (ref 80.0–100.0)
Monocytes Absolute: 0.2 K/uL (ref 0.1–1.0)
Monocytes Relative: 8 %
Neutro Abs: 2.1 K/uL (ref 1.7–7.7)
Neutrophils Relative %: 76 %
Platelets: 105 K/uL — ABNORMAL LOW (ref 150–400)
RBC: 3.4 MIL/uL — ABNORMAL LOW (ref 3.87–5.11)
RDW: 16.3 % — ABNORMAL HIGH (ref 11.5–15.5)
WBC: 2.8 K/uL — ABNORMAL LOW (ref 4.0–10.5)
nRBC: 0 % (ref 0.0–0.2)

## 2024-05-26 LAB — COMPREHENSIVE METABOLIC PANEL WITH GFR
ALT: 11 U/L (ref 0–44)
AST: 24 U/L (ref 15–41)
Albumin: 3.9 g/dL (ref 3.5–5.0)
Alkaline Phosphatase: 56 U/L (ref 38–126)
Anion gap: 9 (ref 5–15)
BUN: 32 mg/dL — ABNORMAL HIGH (ref 8–23)
CO2: 35 mmol/L — ABNORMAL HIGH (ref 22–32)
Calcium: 8.9 mg/dL (ref 8.9–10.3)
Chloride: 94 mmol/L — ABNORMAL LOW (ref 98–111)
Creatinine, Ser: 1.94 mg/dL — ABNORMAL HIGH (ref 0.44–1.00)
GFR, Estimated: 24 mL/min — ABNORMAL LOW (ref >60)
Glucose, Bld: 133 mg/dL — ABNORMAL HIGH (ref 70–99)
Potassium: 4.2 mmol/L (ref 3.5–5.1)
Sodium: 138 mmol/L (ref 135–145)
Total Bilirubin: 0.5 mg/dL (ref 0.0–1.2)
Total Protein: 7 g/dL (ref 6.5–8.1)

## 2024-05-26 LAB — SAMPLE TO BLOOD BANK

## 2024-05-26 LAB — IRON AND TIBC
Iron: 32 ug/dL (ref 28–170)
Saturation Ratios: 13 % (ref 10.4–31.8)
TIBC: 242 ug/dL — ABNORMAL LOW (ref 250–450)
UIBC: 211 ug/dL

## 2024-05-26 LAB — LACTATE DEHYDROGENASE: LDH: 176 U/L (ref 98–192)

## 2024-05-26 LAB — FERRITIN: Ferritin: 370 ng/mL — ABNORMAL HIGH (ref 11–307)

## 2024-05-26 MED ORDER — EPOETIN ALFA-EPBX 40000 UNIT/ML IJ SOLN
40000.0000 [IU] | Freq: Once | INTRAMUSCULAR | Status: AC
  Administered 2024-05-26: 40000 [IU] via SUBCUTANEOUS
  Filled 2024-05-26: qty 1

## 2024-05-26 NOTE — Patient Instructions (Signed)

## 2024-05-26 NOTE — Progress Notes (Signed)
Patient presents today for Retacrit injection. Hemoglobin reviewed prior to administration. VSS tolerated without incident or complaint. See MAR for details. Patient stable during and after injection. Patient discharged in satisfactory condition with no s/s of distress noted.  

## 2024-05-27 ENCOUNTER — Other Ambulatory Visit: Payer: Self-pay | Admitting: Family Medicine

## 2024-05-31 DIAGNOSIS — H2512 Age-related nuclear cataract, left eye: Secondary | ICD-10-CM | POA: Diagnosis not present

## 2024-06-01 ENCOUNTER — Encounter (INDEPENDENT_AMBULATORY_CARE_PROVIDER_SITE_OTHER): Payer: Self-pay | Admitting: Gastroenterology

## 2024-06-07 NOTE — Progress Notes (Unsigned)
 [facility] [address], [place] [address]   CLINIC:  Medical Oncology/Hematology  PCP:  Jolinda Norene HERO, DO [address] [place] [address] [phone]  REASON FOR VISIT:  Follow-up for normocytic anemia + leukopenia/thrombocytopenia + history of breast cancer  BRIEF ONCOLOGIC HISTORY:   Oncology History Overview Note  Cancer Staging Malignant neoplasm of lower-outer quadrant of right breast of female, estrogen receptor positive (HCC) Staging form: Breast, AJCC 8th Edition - Clinical stage from 02/22/2018: Stage IA (cT1c, cN0, cM0, G1, ER+, PR+, HER2-) - Signed by Lanny Callander, MD on 03/02/2018    Malignant neoplasm of lower-outer quadrant of right breast of female, estrogen receptor positive (HCC)  02/12/2018 Mammogram   She had routine screening bilateral mammography on 02/12/2018 at [facility] with results showing: indeterminate irregular mass in the right breast.    02/16/2018 Mammogram   She underwent right diagnostic mammography with tomography and right breast ultrasonography at [facility] on 02/16/2018 showing: 1.1 cm irregular mass at the 8 O'clock position on the right breast   02/22/2018 Pathology Results   Diagnosis 1. Breast, right, needle core biopsy, lateral, 8 o'clock - INVASIVE AND IN SITU DUCTAL CARCINOMA. - SEE COMMENT. 2. Breast, right, needle core biopsy, 11 o'clock - FIBROCYSTIC CHANGES. - USUAL DUCTAL HYPERPLASIA. - THERE IS NO EVIDENCE OF MALIGNANCY.   02/22/2018 Receptors her2   IMMUNOHISTOCHEMICAL AND MORPHOMETRIC ANALYSIS PERFORMED MANUALLY Estrogen Receptor: 100%, POSITIVE, STRONG STAINING INTENSITY Progesterone Receptor: 40%, POSITIVE, MODERATE STAINING INTENSITY Proliferation Marker Ki67: 1% Her2 Negative   02/22/2018 Cancer Staging   Staging form: Breast, AJCC 8th Edition - Clinical stage from 02/22/2018: Stage IA (cT1c, cN0, cM0, G1, ER+, PR+, HER2-) - Signed by Lanny Callander, MD on 03/02/2018    02/25/2018 Initial Diagnosis    Malignant neoplasm of lower-outer quadrant of right breast of female, estrogen receptor positive (HCC)     CANCER STAGING:  Cancer Staging  Malignant neoplasm of lower-outer quadrant of right breast of female, estrogen receptor positive (HCC) Staging form: Breast, AJCC 8th Edition - Clinical stage from 02/22/2018: Stage IA (cT1c, cN0, cM0, G1, ER+, PR+, HER2-) - Signed by Lanny Callander, MD on 03/02/2018   INTERVAL HISTORY:   [name], a 89 y.o. female, returns for routine follow-up of her breast cancer and pancytopenia. [name] was last seen on 02/17/2024 by Pleasant Barefoot PA-C.    She is accompanied today by her daughter. She has not had any hospitalizations or major changes in her health since her last visit.  At today's visit, she reports feeling fair. She reports 75% energy and 75% appetite. She is maintaining stable weight at this time.  PANCYTOPENIA:  She has significant bruising of the extremities.    She reports that she bleeds easily when cut, but she denies any hematochezia melena.  She does have some dark stool from her iron  supplement. She has occasional scant epistaxis from her supplemental oxygen . She has dyspnea on exertion related to her CHF, and is on supplemental oxygen  around-the-clock. She denies any exertional chest pain.    She denies any B symptoms.  No recent infections. She continues to take daily iron  supplement with orange juice.   She takes daily vitamin B12 supplement.    BREAST CANCER:  She denies any new breast lumps or lymphadenopathy.    No new onset bone pain, abdominal pain, or headaches. She reports that she is tolerating Prolia  injections well without any dental issues, fractures, or jaw pain.  ASSESSMENT & PLAN:  1.  PANCYTOPENIA - Leukopenia and thrombocytopenia since 2015 in addition to anemia that is in part related to CKD, relative iron  deficiency, and B12 deficiency. - Moderate to high suspicion for MDS or other infiltrative bone  marrow disorder, but bone marrow biopsy has been declined. - CT abdomen/pelvis (01/16/2021): Normal-sized spleen.  Liver without any noted abnormalities.   - Folate normal.  Borderline B12 deficiency noted on 12/04/2021 with normal B12 but elevated methylmalonic acid 605 - Rheumatoid factor and ANA negative. - History of acute blood loss anemia requiring 2 units PRBC in January 2023. - EGD (09/01/2021) showed mild chronic gastritis and nonbleeding duodenal ulcer - She is taking ferrous sulfate  325 mg and vitamin B12 500 mcg daily.  - ESA therapy initiated in September 2024.  Current dose is Retacrit  40,000 units every 2 weeks. - Labs (05/26/2024):  Ferritin 370, iron  saturation 13% CMP with baseline CKD stage IV.  LDH normal. (Labs from June 2025 showed Vitamin B12 1026, MMA elevated 682 (likely due to CKD).) - CBC today (06/08/2024): Hgb 10.2/MCV 90.4, platelets 108, WBC 3.7 -No bright red blood per rectum or melena.  Admits to easy bruising, but denies petechial rash.  No B symptoms, frequent infections, or lymphadenopathy. - DIFFERENTIAL DIAGNOSIS includes probable MDS with multifactorial anemia from CKD, relative iron  deficiency, B12 deficiency.   Extensive discussion with patient and family member (daughter) in clinic on 06/08/2024.  Discussed moderate to high clinical suspicion for MDS, but unable to definitively diagnose this or rule out other bone marrow infiltrative process without bone marrow biopsy.  We discussed that if she did have MDS, there is an inherent risk to leukemic transformation.  Due to patient's advanced age and other comorbidities, they wished to decline bone marrow biopsy and continue conservative treatment with ESA injections. - PLAN: Continue Retacrit  40,000 units every 2 weeks  - Patient can STOP daily iron  tablet daily  - Continue vitamin B12 500 mcg daily. - Continue close monitoring and ongoing discussion regarding additional testing and treatment as appropriate.   Patient continues to decline bone marrow biopsy, but would reconsider if there were major changes from baseline or blood counts. - RTC with labs (CBC/D, CMP, LDH, ferritin, iron /TIBC, B12, MMA, folate) and office visit in 3 months.  2.  History of stage I right-sided breast cancer (2019) - She had a mammogram on 02/16/2018 which showed B RADS category 0 incomplete.  She was referred for a biopsy. - Right breast biopsy on 02/22/2018 consistent with IDC. - Right lumpectomy on 04/08/2018, IDC, 1.1 cm, grade 1, margins negative, ER/PR positive, HER2 negative, Ki-67 1%. - She declined adjuvant radiation therapy. - Completed 5 years of letrozole  from 05/28/2018 through 06/18/2023.  - BCI testing (see Media file from 10/22/2022) showed NO benefit from endocrine therapy extended beyond 5 years, as she has 1.9% risk of late distant recurrence regardless of whether 5 to 10 years of aromatase inhibitor treatment is given. - Most recent mammogram (03/31/2024): BI-RADS Category 2, benign.  No evidence of breast malignancy in either breast. - Annual breast exam (06/08/24) negative for any masses, nodules, or lymphadenopathy in either breast - History/ROS negative for any red flag symptoms of recurrent breast cancer. - LFTs normal (05/26/2024) - PLAN:  Annual mammogram and breast exam due August 2026.   3.  Osteopenia - She is at risk for breast cancer associated bone loss in the setting of aromatase inhibitors since 2019 - DEXA scan done 03/07/2019 showed T-score -1.0 -  DEXA scan from 04/23/2021 via [facility] was consistent with osteopenia (T-score -1.7) - DEXA scan per PCP (09/16/2023): T-score -1.8 in right total hip, osteopenia.  FRAX with 18% risk of major osteoporotic fracture and 11.7% risk of hip fracture within the next 10 years. - Patient takes vitamin D  and calcium  supplements at home, with most recent vitamin D  normal at 60.3 (11/03/2022).  Normal calcium  (8.9) on 05/26/2024. - Started on Prolia  in February 2024  due to elevated FRAX. - Tolerating Prolia  well.  No fractures, bone pain, or jaw pain since last visit. - PLAN: Continue Prolia  every 6 months (given 05/12/2024 - next due March 2026)  -- Continue calcium  and vitamin D  supplements with weight-bearing exercise as tolerated. - Check vitamin D  with next labs. - Next bone density scan due around January 2027, can be ordered by us  or by PCP.  PLAN SUMMARY:  >> CBC/BB sample + Retacrit  40,000 units every 2 weeks  >> Labs in 3 months = CBC/D, CMP, LDH, ferritin, iron /TIBC, vitamin D , B12, MMA, folate  >> OFFICE visit in 3 months (2 weeks after labs) + same day CBC/Injection  ** OTHER UPCOMING TESTS & TREATMENTS: - Prolia  March 2026 - Annual mammogram + breast exam August 2026 - Bone density scan January 2027    REVIEW OF SYSTEMS:  Review of Systems  Constitutional:  Positive for fatigue. Negative for appetite change, chills, diaphoresis, fever and unexpected weight change.  HENT:   Negative for lump/mass and nosebleeds.   Eyes:  Negative for eye problems.  Respiratory:  Positive for shortness of breath. Negative for cough and hemoptysis.   Cardiovascular:  Negative for chest pain, leg swelling and palpitations.  Gastrointestinal:  Negative for abdominal pain, blood in stool, constipation, diarrhea, nausea and vomiting.  Genitourinary:  Negative for hematuria.   Skin: Negative.   Neurological:  Negative for dizziness, headaches and light-headedness.  Hematological:  Bruises/bleeds easily.   PHYSICAL EXAM:  Performance status (ECOG): 2 - Symptomatic, <50% confined to bed  There were no vitals filed for this visit.  Wt Readings from Last 3 Encounters:  05/24/24 109 lb 4 oz (49.6 kg)  02/17/24 108 lb (49 kg)  02/15/24 107 lb (48.5 kg)   Physical Exam Constitutional:      Appearance: Normal appearance.     Comments: Thin body habitus  Cardiovascular:     Rate and Rhythm: Normal rate and regular rhythm.     Pulses: Normal pulses.      Heart sounds: Murmur heard.  Pulmonary:     Effort: Pulmonary effort is normal.     Breath sounds: Decreased air movement present.  Skin:    Findings: Bruising (extremities) present.  Neurological:     General: No focal deficit present.     Mental Status: She is alert and oriented to person, place, and time.  Psychiatric:        Mood and Affect: Mood normal.        Behavior: Behavior normal.     PAST MEDICAL/SURGICAL HISTORY:  Past Medical History:  Diagnosis Date   Anemia    Arthritis    Asthma    Atrial fibrillation (HCC)    Basal cell carcinoma 02/06/2009   Left ear targus- (MO[facility])   Basal cell carcinoma 09/28/2008   Right back-([id number])   Breast cancer (HCC) 2019   invasive and ductal ca   Breast cancer, right (HCC) 04/08/2018   CKD (chronic kidney disease), stage III (HCC)    COPD (chronic obstructive pulmonary  disease) (HCC)    Duodenal ulcer 10/01/2021   Essential hypertension, malignant    Heart murmur    Hyperlipidemia    Hypertension    Hyponatremia 10/14/2015   Hypothyroidism    Nodule of right lung    Right upper lobe   Osteopenia due to cancer therapy 10/07/2022   Pneumonia    PONV (postoperative nausea and vomiting)    Renal insufficiency    Chronic   Renal vascular disease    Right renal artery stenosis 05/09/2015   Squamous cell carcinoma of skin 09/07/2019   KA-Left shin-txpbx   Tear of lateral meniscus of knee 10/27/2017   UGI bleed 08/31/2021   Vertigo    Past Surgical History:  Procedure Laterality Date   BIOPSY  04/09/2021   Procedure: BIOPSY;  Surgeon: Eartha Angelia Sieving, MD;  Location: [facility];  Service: Gastroenterology;;   BREAST LUMPECTOMY WITH RADIOACTIVE SEED LOCALIZATION Right 04/08/2018   Procedure: BREAST LUMPECTOMY WITH RADIOACTIVE SEED LOCALIZATION;  Surgeon: Ebbie Cough, MD;  Location: [facility];  Service: General;  Laterality: Right;   COLONOSCOPY  2016   diverticulosis, hemorrhoids    ESOPHAGOGASTRODUODENOSCOPY  2016   normal   ESOPHAGOGASTRODUODENOSCOPY (EGD) WITH PROPOFOL  N/A 04/09/2021   Procedure: ESOPHAGOGASTRODUODENOSCOPY (EGD) WITH PROPOFOL ;  Surgeon: Eartha Angelia Sieving, MD;  Location: [facility];  Service: Gastroenterology;  Laterality: N/A;  12:45   ESOPHAGOGASTRODUODENOSCOPY (EGD) WITH PROPOFOL  N/A 09/01/2021   Procedure: ESOPHAGOGASTRODUODENOSCOPY (EGD) WITH PROPOFOL ;  Surgeon: Cindie Carlin POUR, DO;  Location: [facility];  Service: Endoscopy;  Laterality: N/A;   IR GENERIC HISTORICAL  08/29/2016   IR US  GUIDE VASC ACCESS RIGHT 08/29/2016 [facility]   IR GENERIC HISTORICAL  08/29/2016   IR RENAL BILAT S&I MOD SED 08/29/2016 [facility]   KNEE ARTHROSCOPY Right    2019   THYROIDECTOMY, PARTIAL Right 1981   middle lobe removed 1st, then right lobe removed 7-8 years later (approx. 1988)    SOCIAL HISTORY:  Social History   Socioeconomic History   Marital status: Widowed    Spouse name: Not on file   Number of children: 2   Years of education: 8   Highest education level: Not on file  Occupational History   Occupation: Retired  Tobacco Use   Smoking status: Never   Smokeless tobacco: Never  Vaping Use   Vaping status: Never Used  Substance and Sexual Activity   Alcohol  use: No   Drug use: No   Sexual activity: Not Currently  Other Topics Concern   Not on file  Social History Narrative   Lives alone, but her daughter stays there at night   Ambidextrous (writes with left hand).   No caffeine use.   Social Drivers of Corporate investment banker Strain: Low Risk  (12/18/2023)   Overall Financial Resource Strain (CARDIA)    Difficulty of Paying Living Expenses: Not hard at all  Food Insecurity: No Food Insecurity (12/18/2023)   Hunger Vital Sign    Worried About Running Out of Food in the Last Year: Never true    Ran Out of Food in the Last Year: Never true  Transportation Needs: No Transportation Needs (12/18/2023)   PRAPARE -  Administrator, Civil Service (Medical): No    Lack of Transportation (Non-Medical): No  Physical Activity: Inactive (12/18/2023)   Exercise Vital Sign    Days of Exercise per Week: 0 days    Minutes of Exercise per Session: 0 min  Stress:  No Stress Concern Present (12/18/2023)   [employer] of Occupational Health - Occupational Stress Questionnaire    Feeling of Stress : Not at all  Social Connections: Socially Isolated (12/18/2023)   Social Connection and Isolation Panel    Frequency of Communication with Friends and Family: More than three times a week    Frequency of Social Gatherings with Friends and Family: More than three times a week    Attends Religious Services: Never    Database administrator or Organizations: No    Attends Banker Meetings: Never    Marital Status: Widowed  Intimate Partner Violence: Not At Risk (12/18/2023)   Humiliation, Afraid, Rape, and Kick questionnaire    Fear of Current or Ex-Partner: No    Emotionally Abused: No    Physically Abused: No    Sexually Abused: No    FAMILY HISTORY:  Family History  Problem Relation Age of Onset   Thyroid  disease Mother    Stroke Father    Hypertension Brother    Lung cancer Maternal Uncle    Cancer Maternal Grandmother        Liver   Lung cancer Maternal Uncle    Hypertension Daughter    Hypercholesterolemia Daughter    Hypercholesterolemia Daughter     CURRENT MEDICATIONS:  Current Outpatient Medications  Medication Sig Dispense Refill   albuterol  (VENTOLIN  HFA) 108 (90 Base) MCG/ACT inhaler Inhale 2 puffs into the lungs every 6 (six) hours as needed for wheezing or shortness of breath. 8 g 6   atorvastatin  (LIPITOR) 20 MG tablet TAKE 1/2 TABLET BY MOUTH DAILY 45 tablet 0   bisoprolol  (ZEBETA ) 5 MG tablet TAKE 1 TABLET (5 MG TOTAL) BY MOUTH DAILY. 90 tablet 0   cloNIDine  (CATAPRES ) 0.1 MG tablet Take 1 tablet (0.1 mg total) by mouth as directed. Take 1 tablet (0.1 mg total) by  mouth daily as needed for systolic blood pressure greater than 180. 20 tablet 2   feeding supplement (ENSURE ENLIVE / ENSURE PLUS) LIQD Take 237 mLs by mouth 2 (two) times daily between meals. (Patient taking differently: Take 237 mLs by mouth daily as needed (decreased appetite).) 237 mL 12   ferrous sulfate  325 (65 FE) MG tablet Take 325 mg by mouth daily with breakfast.     flecainide  (TAMBOCOR ) 50 MG tablet TAKE 1 TABLET BY MOUTH TWICE A DAY 180 tablet 1   Fluticasone -Umeclidin-Vilant (TRELEGY ELLIPTA ) 100-62.5-25 MCG/ACT AEPB Inhale 1 puff into the lungs daily. (Patient not taking: Reported on 05/26/2024) 60 each 5   hydrALAZINE  (APRESOLINE ) 50 MG tablet TAKE 1 TABLET BY MOUTH TWICE A DAY 180 tablet 3   levothyroxine  (SYNTHROID ) 75 MCG tablet TAKE 1 TABLET BY MOUTH EVERY DAY 90 tablet 0   lidocaine  (LIDODERM ) 5 % Place 1 patch onto the skin daily. Remove & Discard patch within 12 hours or as directed by MD (Patient not taking: Reported on 05/26/2024) 30 patch 0   mirtazapine  (REMERON ) 7.5 MG tablet TAKE 1 TABLET BY MOUTH EVERYDAY AT BEDTIME 90 tablet 0   Multiple Vitamins-Minerals (MULTIVITAMIN WOMEN 50+) TABS Take 1 tablet by mouth at bedtime.     pantoprazole  (PROTONIX ) 40 MG tablet TAKE 1 TABLET BY MOUTH EVERY DAY 90 tablet 1   torsemide  (DEMADEX ) 20 MG tablet TAKE 1 TABLET BY MOUTH TWICE A DAY 180 tablet 0   No current facility-administered medications for this visit.    ALLERGIES:  Allergies  Allergen Reactions   Norvasc  [Amlodipine ] Swelling  Edema    Tape Other (See Comments)    Has to have no stick tape.   Zestril  [Lisinopril ] Cough   Hytrin  [Terazosin ] Hives and Nausea Only   Imdur  [Isosorbide  Nitrate] Nausea Only   Sulfonamide Derivatives Nausea And Vomiting and Rash    LABORATORY DATA:  I have reviewed the labs as listed.     Latest Ref Rng & Units 05/26/2024    9:58 AM 05/12/2024    9:54 AM 04/28/2024   10:01 AM  CBC  WBC 4.0 - 10.5 K/uL 2.8  4.0  2.2   Hemoglobin  12.0 - 15.0 g/dL 9.3  9.6  89.6   Hematocrit 36.0 - 46.0 % 30.2  30.7  32.8   Platelets 150 - 400 K/uL 105  69  89       Latest Ref Rng & Units 05/26/2024    9:58 AM 05/12/2024   10:30 AM 02/04/2024   10:18 AM  CMP  Glucose 70 - 99 mg/dL 866  837  94   BUN 8 - 23 mg/dL 32  37  41   Creatinine 0.44 - 1.00 mg/dL 8.05  8.35  8.20   Sodium 135 - 145 mmol/L 138  135  137   Potassium 3.5 - 5.1 mmol/L 4.2  3.5  4.3   Chloride 98 - 111 mmol/L 94  91  96   CO2 22 - 32 mmol/L 35  33  32   Calcium  8.9 - 10.3 mg/dL 8.9  8.9  8.5   Total Protein 6.5 - 8.1 g/dL 7.0  6.7  7.2   Total Bilirubin 0.0 - 1.2 mg/dL 0.5  1.2  0.7   Alkaline Phos 38 - 126 U/L 56  42  43   AST 15 - 41 U/L 24  18  23    ALT 0 - 44 U/L 11  9  14      DIAGNOSTIC IMAGING:  I have independently reviewed the scans and discussed with the patient. No results found.    WRAP UP:  All questions were answered. The patient knows to call the clinic with any problems, questions or concerns.  Medical decision making: Moderate  Time spent on visit: I spent 20 minutes counseling the patient face to face. The total time spent in the appointment was 30 minutes and more than 50% was on counseling.  Pleasant CHRISTELLA Barefoot, PA-C  06/08/24 10:15 AM

## 2024-06-08 ENCOUNTER — Inpatient Hospital Stay (HOSPITAL_BASED_OUTPATIENT_CLINIC_OR_DEPARTMENT_OTHER): Admitting: Physician Assistant

## 2024-06-08 ENCOUNTER — Encounter: Payer: Self-pay | Admitting: Physician Assistant

## 2024-06-08 ENCOUNTER — Inpatient Hospital Stay

## 2024-06-08 VITALS — BP 141/52 | HR 62 | Temp 97.4°F | Resp 17 | Ht 65.0 in | Wt 109.0 lb

## 2024-06-08 DIAGNOSIS — M8580 Other specified disorders of bone density and structure, unspecified site: Secondary | ICD-10-CM

## 2024-06-08 DIAGNOSIS — D500 Iron deficiency anemia secondary to blood loss (chronic): Secondary | ICD-10-CM

## 2024-06-08 DIAGNOSIS — C50511 Malignant neoplasm of lower-outer quadrant of right female breast: Secondary | ICD-10-CM

## 2024-06-08 DIAGNOSIS — D631 Anemia in chronic kidney disease: Secondary | ICD-10-CM | POA: Diagnosis not present

## 2024-06-08 DIAGNOSIS — N184 Chronic kidney disease, stage 4 (severe): Secondary | ICD-10-CM

## 2024-06-08 DIAGNOSIS — D649 Anemia, unspecified: Secondary | ICD-10-CM

## 2024-06-08 DIAGNOSIS — Z170 Estrogen receptor positive status [ER+]: Secondary | ICD-10-CM

## 2024-06-08 DIAGNOSIS — D61818 Other pancytopenia: Secondary | ICD-10-CM

## 2024-06-08 LAB — CBC
HCT: 33.8 % — ABNORMAL LOW (ref 36.0–46.0)
Hemoglobin: 10.2 g/dL — ABNORMAL LOW (ref 12.0–15.0)
MCH: 27.3 pg (ref 26.0–34.0)
MCHC: 30.2 g/dL (ref 30.0–36.0)
MCV: 90.4 fL (ref 80.0–100.0)
Platelets: 108 K/uL — ABNORMAL LOW (ref 150–400)
RBC: 3.74 MIL/uL — ABNORMAL LOW (ref 3.87–5.11)
RDW: 16.9 % — ABNORMAL HIGH (ref 11.5–15.5)
WBC: 3.7 K/uL — ABNORMAL LOW (ref 4.0–10.5)
nRBC: 0 % (ref 0.0–0.2)

## 2024-06-08 LAB — SAMPLE TO BLOOD BANK

## 2024-06-08 MED ORDER — EPOETIN ALFA-EPBX 40000 UNIT/ML IJ SOLN
40000.0000 [IU] | Freq: Once | INTRAMUSCULAR | Status: AC
  Administered 2024-06-08: 40000 [IU] via SUBCUTANEOUS
  Filled 2024-06-08: qty 1

## 2024-06-08 NOTE — Patient Instructions (Signed)
 [facility] [place] - A DEPT OF [facility]  Discharge Instructions: Thank you for choosing [facility] to provide your oncology and hematology care.  If you have a lab appointment with the Cancer Center - please note that after April 8th, 2024, all labs will be drawn in the cancer center.  You do not have to check in or register with the main entrance as you have in the past but will complete your check-in in the cancer center.  Wear comfortable clothing and clothing appropriate for easy access to any Portacath or PICC line.   We strive to give you quality time with your provider. You may need to reschedule your appointment if you arrive late (15 or more minutes).  Arriving late affects you and other patients whose appointments are after yours.  Also, if you miss three or more appointments without notifying the office, you may be dismissed from the clinic at the provider's discretion.      For prescription refill requests, have your pharmacy contact our office and allow 72 hours for refills to be completed.    Today you received the following chemotherapy and/or immunotherapy agents B12 injection Vitamin B12 Injection What is this medication? Vitamin B12 (VAHY tuh min B12) prevents and treats low vitamin B12 levels in your body. It is used in people who do not get enough vitamin B12 from their diet or when their digestive tract does not absorb enough. Vitamin B12 plays an important role in maintaining the health of your nervous system and red blood cells. This medicine may be used for other purposes; ask your health care provider or pharmacist if you have questions. COMMON BRAND NAME(S): B-12 Compliance Kit, B-12 Injection Kit, Cyomin, Dodex, LA-12, Nutri-Twelve, Physicians EZ Use B-12, Primabalt, Vitamin Deficiency Injectable System - B12 What should I tell my care team before I take this medication? They need to know if you have any of these  conditions: Kidney disease Leber's disease Megaloblastic anemia An unusual or allergic reaction to cyanocobalamin, cobalt, other medications, foods, dyes, or preservatives Pregnant or trying to get pregnant Breast-feeding How should I use this medication? This medication is injected into a muscle or deeply under the skin. It is usually given in a clinic or care team's office. However, your care team may teach you how to inject yourself. Follow all instructions. Talk to your care team about the use of this medication in children. Special care may be needed. Overdosage: If you think you have taken too much of this medicine contact a poison control center or emergency room at once. NOTE: This medicine is only for you. Do not share this medicine with others. What if I miss a dose? If you are given your dose at a clinic or care team's office, call to reschedule your appointment. If you give your own injections, and you miss a dose, take it as soon as you can. If it is almost time for your next dose, take only that dose. Do not take double or extra doses. What may interact with this medication? Alcohol Colchicine This list may not describe all possible interactions. Give your health care provider a list of all the medicines, herbs, non-prescription drugs, or dietary supplements you use. Also tell them if you smoke, drink alcohol, or use illegal drugs. Some items may interact with your medicine. What should I watch for while using this medication? Visit your care team regularly. You may need blood work done while  you are taking this medication. You may need to follow a special diet. Talk to your care team. Limit your alcohol intake and avoid smoking to get the best benefit. What side effects may I notice from receiving this medication? Side effects that you should report to your care team as soon as possible: Allergic reactions--skin rash, itching, hives, swelling of the face, lips, tongue, or  throat Swelling of the ankles, hands, or feet Trouble breathing Side effects that usually do not require medical attention (report to your care team if they continue or are bothersome): Diarrhea This list may not describe all possible side effects. Call your doctor for medical advice about side effects. You may report side effects to FDA at 1-800-FDA-1088. Where should I keep my medication? Keep out of the reach of children. Store at room temperature between 15 and 30 degrees C (59 and 85 degrees F). Protect from light. Throw away any unused medication after the expiration date. NOTE: This sheet is a summary. It may not cover all possible information. If you have questions about this medicine, talk to your doctor, pharmacist, or health care provider.  2024 Elsevier/Gold Standard (2021-04-16 00:00:00)       To help prevent nausea and vomiting after your treatment, we encourage you to take your nausea medication as directed.  BELOW ARE SYMPTOMS THAT SHOULD BE REPORTED IMMEDIATELY: *FEVER GREATER THAN 100.4 F (38 C) OR HIGHER *CHILLS OR SWEATING *NAUSEA AND VOMITING THAT IS NOT CONTROLLED WITH YOUR NAUSEA MEDICATION *UNUSUAL SHORTNESS OF BREATH *UNUSUAL BRUISING OR BLEEDING *URINARY PROBLEMS (pain or burning when urinating, or frequent urination) *BOWEL PROBLEMS (unusual diarrhea, constipation, pain near the anus) TENDERNESS IN MOUTH AND THROAT WITH OR WITHOUT PRESENCE OF ULCERS (sore throat, sores in mouth, or a toothache) UNUSUAL RASH, SWELLING OR PAIN  UNUSUAL VAGINAL DISCHARGE OR ITCHING   Items with * indicate a potential emergency and should be followed up as soon as possible or go to the Emergency Department if any problems should occur.  Please show the CHEMOTHERAPY ALERT CARD or IMMUNOTHERAPY ALERT CARD at check-in to the Emergency Department and triage nurse.  Should you have questions after your visit or need to cancel or reschedule your appointment, please contact [facility] CAN[facility]  [facility]R  - A DEPT OF [facility] [phone]  and follow the prompts.  Office hours are 8:00 a.m. to 4:30 p.m. Monday - Friday. Please note that voicemails left after 4:00 p.m. may not be returned until the following business day.  We are closed weekends and major holidays. You have access to a nurse at all times for urgent questions. Please call the main number to the clinic [phone] and follow the prompts.  For any non-urgent questions, you may also contact your provider using MyChart. We now offer e-Visits for anyone 68 and older to request care online for non-urgent symptoms. For details visit [email or web address].   Also download the MyChart app! Go to the app store, search "MyChart", open the app, select [place], and log in with your MyChart username and password.

## 2024-06-08 NOTE — Progress Notes (Signed)
 Patient presents today for Retacrit  injection and follow up appointment with R. Pennington PA. HGB 10.2. Blood pressure 166/50.   [name] presents today for injection per the provider's orders.  Retacrit  administration without incident; injection site WNL; see MAR for injection details.  Patient tolerated procedure well and without incident.  Discharged from clinic by wheel chair in stable condition. Alert and oriented x 3. F/U with [facility] as scheduled.

## 2024-06-08 NOTE — Patient Instructions (Signed)
 [facility] Discharge Instructions  You were seen today by Pleasant Barefoot PA-C for your low blood counts.  LOW BLOOD COUNTS:   Your low hemoglobin/red blood cells may be related to your chronic kidney disease.   You may also have some underlying bone marrow dysfunction, such as a condition called MDS (myelodysplastic syndrome).  That can only be proven with bone marrow biopsy, but you have stated that you would like to avoid a bone marrow biopsy. We will continue to give you injections (Retacrit ) once every 2 weeks.  This will help your body to make more healthy red blood cells and can be used to treat both anemia from chronic kidney disease and MDS.     IRON : You can STOP taking your iron  tablet for the time being.  VITAMIN B12:  Continue to take over-the-counter vitamin B12 supplement 500 mcg daily.  HISTORY OF BREAST CANCER Your most recent labs, physical exam, and mammogram (03/31/2024) did not show any evidence of recurrent breast cancer. Your next screening mammogram is due in August 2026.  OSTEOPENIA:: Continue Prolia  injections every 6 months (next due March 2026)  FOLLOW-UP APPOINTMENT: Office visit in 3 months with Pleasant Barefoot PA-C    ** Thank you for trusting me with your healthcare!  I strive to provide all of my patients with quality care at each visit.  If you receive a survey for this visit, I would be so grateful to you for taking the time to provide feedback.  Thank you in advance!  ~ Clayton Jarmon                                        Dr. Mickiel Davonna Pleasant Barefoot, PA-C     Delon Hope, NP   - - - - - - - - - - - - - - - - - -    Thank you for choosing [facility] at [facility] to provide your oncology and hematology care.  To afford each patient quality time with our provider, please arrive at least 15 minutes before your scheduled appointment time.   If you have a lab appointment  with the Cancer Center please come in thru the Main Entrance and check in at the main information desk.  You need to re-schedule your appointment should you arrive 10 or more minutes late.  We strive to give you quality time with our providers, and arriving late affects you and other patients whose appointments are after yours.  Also, if you no show three or more times for appointments you may be dismissed from the clinic at the providers discretion.     Again, thank you for choosing [facility].  Our hope is that these requests will decrease the amount of time that you wait before being seen by our physicians.       _____________________________________________________________  Should you have questions after your visit to [facility], please contact our office at [phone] and follow the prompts.  Our office hours are 8:00 a.m. and 4:30 p.m. Monday - Friday.  Please note that voicemails left after 4:00 p.m. may not be returned until the following business day.  We are closed weekends and major holidays.  You do have access to a nurse 24-7, just call the main number to the clinic  [phone] and do not press any options, hold on the line and a nurse will answer the phone.    For prescription refill requests, have your pharmacy contact our office and allow 72 hours.    Due to Covid, you will need to wear a mask upon entering the hospital. If you do not have a mask, a mask will be given to you at the Main Entrance upon arrival. For doctor visits, patients may have 1 support person age 60 or older with them. For treatment visits, patients can not have anyone with them due to social distancing guidelines and our immunocompromised population.

## 2024-06-09 DIAGNOSIS — H2511 Age-related nuclear cataract, right eye: Secondary | ICD-10-CM | POA: Diagnosis not present

## 2024-06-13 ENCOUNTER — Ambulatory Visit: Admitting: Gastroenterology

## 2024-06-14 DIAGNOSIS — H2511 Age-related nuclear cataract, right eye: Secondary | ICD-10-CM | POA: Diagnosis not present

## 2024-06-19 ENCOUNTER — Other Ambulatory Visit: Payer: Self-pay | Admitting: Gastroenterology

## 2024-06-19 DIAGNOSIS — R1013 Epigastric pain: Secondary | ICD-10-CM

## 2024-06-22 ENCOUNTER — Other Ambulatory Visit: Payer: Self-pay | Admitting: Family Medicine

## 2024-06-22 ENCOUNTER — Inpatient Hospital Stay: Attending: Oncology

## 2024-06-22 ENCOUNTER — Inpatient Hospital Stay

## 2024-06-22 VITALS — BP 167/51 | HR 57 | Temp 96.4°F | Resp 18

## 2024-06-22 DIAGNOSIS — M8580 Other specified disorders of bone density and structure, unspecified site: Secondary | ICD-10-CM

## 2024-06-22 DIAGNOSIS — N184 Chronic kidney disease, stage 4 (severe): Secondary | ICD-10-CM | POA: Diagnosis not present

## 2024-06-22 DIAGNOSIS — D631 Anemia in chronic kidney disease: Secondary | ICD-10-CM | POA: Diagnosis not present

## 2024-06-22 DIAGNOSIS — J3489 Other specified disorders of nose and nasal sinuses: Secondary | ICD-10-CM

## 2024-06-22 DIAGNOSIS — D649 Anemia, unspecified: Secondary | ICD-10-CM

## 2024-06-22 LAB — CBC
HCT: 30.8 % — ABNORMAL LOW (ref 36.0–46.0)
Hemoglobin: 9.5 g/dL — ABNORMAL LOW (ref 12.0–15.0)
MCH: 27.5 pg (ref 26.0–34.0)
MCHC: 30.8 g/dL (ref 30.0–36.0)
MCV: 89 fL (ref 80.0–100.0)
Platelets: 111 K/uL — ABNORMAL LOW (ref 150–400)
RBC: 3.46 MIL/uL — ABNORMAL LOW (ref 3.87–5.11)
RDW: 16.9 % — ABNORMAL HIGH (ref 11.5–15.5)
WBC: 3.9 K/uL — ABNORMAL LOW (ref 4.0–10.5)
nRBC: 0 % (ref 0.0–0.2)

## 2024-06-22 LAB — SAMPLE TO BLOOD BANK

## 2024-06-22 MED ORDER — EPOETIN ALFA-EPBX 40000 UNIT/ML IJ SOLN
40000.0000 [IU] | Freq: Once | INTRAMUSCULAR | Status: AC
  Administered 2024-06-22: 40000 [IU] via SUBCUTANEOUS
  Filled 2024-06-22: qty 1

## 2024-06-22 NOTE — Progress Notes (Signed)
Patient presents today for Retacrit injection. Hemoglobin reviewed prior to administration. VSS tolerated without incident or complaint. See MAR for details. Patient stable during and after injection. Patient discharged in satisfactory condition with no s/s of distress noted.  

## 2024-06-22 NOTE — Patient Instructions (Signed)

## 2024-06-22 NOTE — Telephone Encounter (Unsigned)
 Copied from CRM #[id number]. Topic: Clinical - Medication Question >> Jun 22, 2024  3:07 PM Avram MATSU wrote: Reason for CRM: patient is calling to get a refill on fluticasone  (FLONASE ) 50 MCG/ACT nasal spray [[id number]] DISCONTINUED please advise [phone] (H)

## 2024-06-23 ENCOUNTER — Other Ambulatory Visit

## 2024-06-23 DIAGNOSIS — N1832 Chronic kidney disease, stage 3b: Secondary | ICD-10-CM | POA: Diagnosis not present

## 2024-06-28 ENCOUNTER — Telehealth: Payer: Self-pay | Admitting: Family Medicine

## 2024-06-28 NOTE — Telephone Encounter (Signed)
 Pt notified there is no availability. LS

## 2024-06-28 NOTE — Telephone Encounter (Unsigned)
 Copied from CRM [id number]. Topic: Appointments - Scheduling Inquiry for Clinic >> Jun 28, 2024  4:18 PM Avram MATSU wrote: Reason for CRM: patient stated her provider told her if she needed to come in for a sick visit, the provider would be able to get worked in. 11/12 9:50 is her appt. Pt is aware her visit is with the DOD but would prefer her provider.  Please advise [phone] (H)

## 2024-06-29 ENCOUNTER — Inpatient Hospital Stay (HOSPITAL_COMMUNITY)
Admission: EM | Admit: 2024-06-29 | Discharge: 2024-07-03 | DRG: 193 | Disposition: A | Discharge status: Home / Self Care | Attending: Emergency Medicine | Admitting: Emergency Medicine

## 2024-06-29 ENCOUNTER — Other Ambulatory Visit: Payer: Self-pay

## 2024-06-29 ENCOUNTER — Encounter (HOSPITAL_COMMUNITY): Payer: Self-pay | Admitting: Emergency Medicine

## 2024-06-29 ENCOUNTER — Inpatient Hospital Stay (HOSPITAL_COMMUNITY)

## 2024-06-29 ENCOUNTER — Ambulatory Visit (INDEPENDENT_AMBULATORY_CARE_PROVIDER_SITE_OTHER): Admitting: Family Medicine

## 2024-06-29 ENCOUNTER — Emergency Department (HOSPITAL_COMMUNITY)

## 2024-06-29 ENCOUNTER — Encounter: Payer: Self-pay | Admitting: Family Medicine

## 2024-06-29 VITALS — BP 156/57 | HR 77 | Temp 97.2°F | Ht 65.0 in | Wt 110.8 lb

## 2024-06-29 DIAGNOSIS — Z79890 Hormone replacement therapy (postmenopausal): Secondary | ICD-10-CM | POA: Diagnosis not present

## 2024-06-29 DIAGNOSIS — J9601 Acute respiratory failure with hypoxia: Principal | ICD-10-CM

## 2024-06-29 DIAGNOSIS — R918 Other nonspecific abnormal finding of lung field: Secondary | ICD-10-CM | POA: Diagnosis not present

## 2024-06-29 DIAGNOSIS — Z91048 Other nonmedicinal substance allergy status: Secondary | ICD-10-CM | POA: Diagnosis not present

## 2024-06-29 DIAGNOSIS — J90 Pleural effusion, not elsewhere classified: Secondary | ICD-10-CM | POA: Diagnosis present

## 2024-06-29 DIAGNOSIS — J440 Chronic obstructive pulmonary disease with acute lower respiratory infection: Secondary | ICD-10-CM | POA: Diagnosis not present

## 2024-06-29 DIAGNOSIS — E785 Hyperlipidemia, unspecified: Secondary | ICD-10-CM | POA: Diagnosis present

## 2024-06-29 DIAGNOSIS — J9621 Acute and chronic respiratory failure with hypoxia: Secondary | ICD-10-CM | POA: Diagnosis present

## 2024-06-29 DIAGNOSIS — I5033 Acute on chronic diastolic (congestive) heart failure: Secondary | ICD-10-CM | POA: Diagnosis present

## 2024-06-29 DIAGNOSIS — Z888 Allergy status to other drugs, medicaments and biological substances status: Secondary | ICD-10-CM

## 2024-06-29 DIAGNOSIS — I350 Nonrheumatic aortic (valve) stenosis: Secondary | ICD-10-CM | POA: Diagnosis present

## 2024-06-29 DIAGNOSIS — Z9981 Dependence on supplemental oxygen: Secondary | ICD-10-CM | POA: Diagnosis not present

## 2024-06-29 DIAGNOSIS — Z79899 Other long term (current) drug therapy: Secondary | ICD-10-CM | POA: Diagnosis not present

## 2024-06-29 DIAGNOSIS — Z853 Personal history of malignant neoplasm of breast: Secondary | ICD-10-CM | POA: Diagnosis not present

## 2024-06-29 DIAGNOSIS — I130 Hypertensive heart and chronic kidney disease with heart failure and stage 1 through stage 4 chronic kidney disease, or unspecified chronic kidney disease: Secondary | ICD-10-CM | POA: Diagnosis not present

## 2024-06-29 DIAGNOSIS — J189 Pneumonia, unspecified organism: Secondary | ICD-10-CM

## 2024-06-29 DIAGNOSIS — Z85828 Personal history of other malignant neoplasm of skin: Secondary | ICD-10-CM | POA: Diagnosis not present

## 2024-06-29 DIAGNOSIS — Z800 Family history of malignant neoplasm of digestive organs: Secondary | ICD-10-CM

## 2024-06-29 DIAGNOSIS — R058 Other specified cough: Secondary | ICD-10-CM

## 2024-06-29 DIAGNOSIS — Z1152 Encounter for screening for COVID-19: Secondary | ICD-10-CM

## 2024-06-29 DIAGNOSIS — J849 Interstitial pulmonary disease, unspecified: Secondary | ICD-10-CM | POA: Diagnosis not present

## 2024-06-29 DIAGNOSIS — R0602 Shortness of breath: Secondary | ICD-10-CM | POA: Diagnosis not present

## 2024-06-29 DIAGNOSIS — J181 Lobar pneumonia, unspecified organism: Principal | ICD-10-CM | POA: Diagnosis present

## 2024-06-29 DIAGNOSIS — Z8249 Family history of ischemic heart disease and other diseases of the circulatory system: Secondary | ICD-10-CM | POA: Diagnosis not present

## 2024-06-29 DIAGNOSIS — D61818 Other pancytopenia: Secondary | ICD-10-CM | POA: Diagnosis not present

## 2024-06-29 DIAGNOSIS — N184 Chronic kidney disease, stage 4 (severe): Secondary | ICD-10-CM | POA: Diagnosis not present

## 2024-06-29 DIAGNOSIS — I509 Heart failure, unspecified: Secondary | ICD-10-CM | POA: Diagnosis not present

## 2024-06-29 DIAGNOSIS — I110 Hypertensive heart disease with heart failure: Secondary | ICD-10-CM | POA: Diagnosis not present

## 2024-06-29 DIAGNOSIS — Z7951 Long term (current) use of inhaled steroids: Secondary | ICD-10-CM

## 2024-06-29 DIAGNOSIS — Z8349 Family history of other endocrine, nutritional and metabolic diseases: Secondary | ICD-10-CM

## 2024-06-29 DIAGNOSIS — J9622 Acute and chronic respiratory failure with hypercapnia: Secondary | ICD-10-CM | POA: Diagnosis present

## 2024-06-29 DIAGNOSIS — Z823 Family history of stroke: Secondary | ICD-10-CM

## 2024-06-29 DIAGNOSIS — Z882 Allergy status to sulfonamides status: Secondary | ICD-10-CM

## 2024-06-29 DIAGNOSIS — D696 Thrombocytopenia, unspecified: Secondary | ICD-10-CM | POA: Diagnosis not present

## 2024-06-29 DIAGNOSIS — D469 Myelodysplastic syndrome, unspecified: Secondary | ICD-10-CM | POA: Diagnosis not present

## 2024-06-29 DIAGNOSIS — I480 Paroxysmal atrial fibrillation: Secondary | ICD-10-CM | POA: Diagnosis not present

## 2024-06-29 DIAGNOSIS — E039 Hypothyroidism, unspecified: Secondary | ICD-10-CM | POA: Diagnosis not present

## 2024-06-29 DIAGNOSIS — I5032 Chronic diastolic (congestive) heart failure: Secondary | ICD-10-CM | POA: Diagnosis present

## 2024-06-29 DIAGNOSIS — Z83438 Family history of other disorder of lipoprotein metabolism and other lipidemia: Secondary | ICD-10-CM

## 2024-06-29 DIAGNOSIS — Z801 Family history of malignant neoplasm of trachea, bronchus and lung: Secondary | ICD-10-CM

## 2024-06-29 DIAGNOSIS — J441 Chronic obstructive pulmonary disease with (acute) exacerbation: Secondary | ICD-10-CM | POA: Diagnosis not present

## 2024-06-29 DIAGNOSIS — J948 Other specified pleural conditions: Secondary | ICD-10-CM | POA: Diagnosis not present

## 2024-06-29 LAB — RESP PANEL BY RT-PCR (RSV, FLU A&B, COVID)  RVPGX2
Influenza A by PCR: NEGATIVE
Influenza B by PCR: NEGATIVE
Resp Syncytial Virus by PCR: NEGATIVE
SARS Coronavirus 2 by RT PCR: NEGATIVE

## 2024-06-29 LAB — CBC
HCT: 29.2 % — ABNORMAL LOW (ref 36.0–46.0)
Hemoglobin: 9 g/dL — ABNORMAL LOW (ref 12.0–15.0)
MCH: 27 pg (ref 26.0–34.0)
MCHC: 30.8 g/dL (ref 30.0–36.0)
MCV: 87.7 fL (ref 80.0–100.0)
Platelets: 109 K/uL — ABNORMAL LOW (ref 150–400)
RBC: 3.33 MIL/uL — ABNORMAL LOW (ref 3.87–5.11)
RDW: 17.2 % — ABNORMAL HIGH (ref 11.5–15.5)
WBC: 7.8 K/uL (ref 4.0–10.5)
nRBC: 0 % (ref 0.0–0.2)

## 2024-06-29 LAB — TROPONIN T, HIGH SENSITIVITY
Troponin T High Sensitivity: 30 ng/L — ABNORMAL HIGH (ref 0–19)
Troponin T High Sensitivity: 28 ng/L — ABNORMAL HIGH (ref 0–19)

## 2024-06-29 LAB — HEPATIC FUNCTION PANEL
ALT: 10 U/L (ref 0–44)
AST: 21 U/L (ref 15–41)
Albumin: 4.1 g/dL (ref 3.5–5.0)
Alkaline Phosphatase: 61 U/L (ref 38–126)
Bilirubin, Direct: 0.4 mg/dL — ABNORMAL HIGH (ref 0.0–0.2)
Indirect Bilirubin: 0.4 mg/dL (ref 0.3–0.9)
Total Bilirubin: 0.8 mg/dL (ref 0.0–1.2)
Total Protein: 7.7 g/dL (ref 6.5–8.1)

## 2024-06-29 LAB — BLOOD GAS, VENOUS
Acid-Base Excess: 19.8 mmol/L — ABNORMAL HIGH (ref 0.0–2.0)
Bicarbonate: 47.3 mmol/L — ABNORMAL HIGH (ref 20.0–28.0)
Drawn by: 442
O2 Saturation: 81 %
Patient temperature: 37
pCO2, Ven: 65 mmHg — ABNORMAL HIGH (ref 44–60)
pH, Ven: 7.47 — ABNORMAL HIGH (ref 7.25–7.43)
pO2, Ven: 46 mmHg — ABNORMAL HIGH (ref 32–45)

## 2024-06-29 LAB — RESPIRATORY PANEL BY PCR

## 2024-06-29 LAB — PRO BRAIN NATRIURETIC PEPTIDE: Pro Brain Natriuretic Peptide: 6929 pg/mL — ABNORMAL HIGH (ref <300.0)

## 2024-06-29 LAB — BASIC METABOLIC PANEL WITH GFR
Anion gap: 9 (ref 5–15)
BUN: 36 mg/dL — ABNORMAL HIGH (ref 8–23)
CO2: 37 mmol/L — ABNORMAL HIGH (ref 22–32)
Calcium: 9 mg/dL (ref 8.9–10.3)
Chloride: 91 mmol/L — ABNORMAL LOW (ref 98–111)
Creatinine, Ser: 1.49 mg/dL — ABNORMAL HIGH (ref 0.44–1.00)
GFR, Estimated: 33 mL/min — ABNORMAL LOW (ref >60)
Glucose, Bld: 125 mg/dL — ABNORMAL HIGH (ref 70–99)
Potassium: 4.1 mmol/L (ref 3.5–5.1)
Sodium: 137 mmol/L (ref 135–145)

## 2024-06-29 LAB — LACTIC ACID, PLASMA: Lactic Acid, Venous: 0.6 mmol/L (ref 0.5–1.9)

## 2024-06-29 LAB — MRSA NEXT GEN BY PCR, NASAL: MRSA by PCR Next Gen: DETECTED — AB

## 2024-06-29 LAB — PROCALCITONIN: Procalcitonin: 0.23 ng/mL

## 2024-06-29 MED ORDER — BISOPROLOL FUMARATE 5 MG PO TABS
5.0000 mg | ORAL_TABLET | Freq: Every day | ORAL | Status: DC
  Administered 2024-06-30 – 2024-07-03 (×4): 5 mg via ORAL
  Filled 2024-06-29 (×4): qty 1

## 2024-06-29 MED ORDER — VITAMIN D 25 MCG (1000 UNIT) PO TABS
1000.0000 [IU] | ORAL_TABLET | Freq: Every day | ORAL | Status: DC
  Administered 2024-06-29 – 2024-07-03 (×5): 1000 [IU] via ORAL
  Filled 2024-06-29 (×5): qty 1

## 2024-06-29 MED ORDER — IPRATROPIUM-ALBUTEROL 0.5-2.5 (3) MG/3ML IN SOLN
3.0000 mL | Freq: Once | RESPIRATORY_TRACT | Status: AC
  Administered 2024-06-29: 3 mL via RESPIRATORY_TRACT
  Filled 2024-06-29: qty 3

## 2024-06-29 MED ORDER — METHYLPREDNISOLONE SODIUM SUCC 40 MG IJ SOLR
40.0000 mg | Freq: Two times a day (BID) | INTRAMUSCULAR | Status: DC
  Administered 2024-06-29 – 2024-07-02 (×6): 40 mg via INTRAVENOUS
  Filled 2024-06-29 (×6): qty 1

## 2024-06-29 MED ORDER — AZITHROMYCIN 250 MG PO TABS
500.0000 mg | ORAL_TABLET | Freq: Every day | ORAL | Status: DC
  Administered 2024-06-30 – 2024-07-03 (×4): 500 mg via ORAL
  Filled 2024-06-29 (×4): qty 2

## 2024-06-29 MED ORDER — FLECAINIDE ACETATE 50 MG PO TABS
50.0000 mg | ORAL_TABLET | Freq: Two times a day (BID) | ORAL | Status: DC
  Administered 2024-06-29 – 2024-07-03 (×8): 50 mg via ORAL
  Filled 2024-06-29 (×8): qty 1

## 2024-06-29 MED ORDER — ACETAMINOPHEN 325 MG PO TABS: 650.0000 mg | ORAL_TABLET | Freq: Four times a day (QID) | ORAL | Status: DC | PRN

## 2024-06-29 MED ORDER — SODIUM CHLORIDE 0.9 % IV SOLN
1.0000 g | Freq: Once | INTRAVENOUS | Status: AC
  Administered 2024-06-29: 1 g via INTRAVENOUS
  Filled 2024-06-29: qty 10

## 2024-06-29 MED ORDER — ONDANSETRON HCL 4 MG PO TABS: 4.0000 mg | ORAL_TABLET | Freq: Four times a day (QID) | ORAL | Status: DC | PRN

## 2024-06-29 MED ORDER — FUROSEMIDE 10 MG/ML IJ SOLN
40.0000 mg | Freq: Once | INTRAMUSCULAR | Status: AC
  Administered 2024-06-29: 40 mg via INTRAVENOUS
  Filled 2024-06-29: qty 4

## 2024-06-29 MED ORDER — IPRATROPIUM-ALBUTEROL 0.5-2.5 (3) MG/3ML IN SOLN
3.0000 mL | Freq: Four times a day (QID) | RESPIRATORY_TRACT | Status: AC
  Administered 2024-06-29 – 2024-06-30 (×4): 3 mL via RESPIRATORY_TRACT
  Filled 2024-06-29 (×4): qty 3

## 2024-06-29 MED ORDER — HEPARIN SODIUM (PORCINE) 5000 UNIT/ML IJ SOLN
5000.0000 [IU] | Freq: Three times a day (TID) | INTRAMUSCULAR | Status: DC
  Administered 2024-06-29: 5000 [IU] via SUBCUTANEOUS
  Filled 2024-06-29 (×3): qty 1

## 2024-06-29 MED ORDER — BUDESONIDE 0.5 MG/2ML IN SUSP
0.5000 mg | Freq: Two times a day (BID) | RESPIRATORY_TRACT | Status: DC
  Administered 2024-06-29 – 2024-07-03 (×8): 0.5 mg via RESPIRATORY_TRACT
  Filled 2024-06-29 (×8): qty 2

## 2024-06-29 MED ORDER — ATORVASTATIN CALCIUM 10 MG PO TABS
10.0000 mg | ORAL_TABLET | Freq: Every day | ORAL | Status: DC
  Administered 2024-06-29 – 2024-07-03 (×5): 10 mg via ORAL
  Filled 2024-06-29 (×5): qty 1

## 2024-06-29 MED ORDER — PANTOPRAZOLE SODIUM 40 MG PO TBEC
40.0000 mg | DELAYED_RELEASE_TABLET | Freq: Every day | ORAL | Status: DC
  Administered 2024-06-30 – 2024-07-03 (×4): 40 mg via ORAL
  Filled 2024-06-29 (×4): qty 1

## 2024-06-29 MED ORDER — METHYLPREDNISOLONE SODIUM SUCC 125 MG IJ SOLR
80.0000 mg | Freq: Once | INTRAMUSCULAR | Status: AC
  Administered 2024-06-29: 80 mg via INTRAVENOUS
  Filled 2024-06-29: qty 2

## 2024-06-29 MED ORDER — ACETAMINOPHEN 650 MG RE SUPP: 650.0000 mg | Freq: Four times a day (QID) | RECTAL | Status: DC | PRN

## 2024-06-29 MED ORDER — MIRTAZAPINE 15 MG PO TABS
7.5000 mg | ORAL_TABLET | Freq: Every day | ORAL | Status: DC
  Administered 2024-06-29 – 2024-07-02 (×4): 7.5 mg via ORAL
  Filled 2024-06-29 (×4): qty 1

## 2024-06-29 MED ORDER — ONDANSETRON HCL 4 MG/2ML IJ SOLN: 4.0000 mg | Freq: Four times a day (QID) | INTRAMUSCULAR | Status: DC | PRN

## 2024-06-29 MED ORDER — SODIUM CHLORIDE 0.9 % IV SOLN
500.0000 mg | Freq: Once | INTRAVENOUS | Status: AC
  Administered 2024-06-29: 500 mg via INTRAVENOUS
  Filled 2024-06-29: qty 5

## 2024-06-29 MED ORDER — ARFORMOTEROL TARTRATE 15 MCG/2ML IN NEBU
15.0000 ug | INHALATION_SOLUTION | Freq: Two times a day (BID) | RESPIRATORY_TRACT | Status: DC
  Administered 2024-06-29 – 2024-07-03 (×8): 15 ug via RESPIRATORY_TRACT
  Filled 2024-06-29 (×8): qty 2

## 2024-06-29 MED ORDER — SODIUM CHLORIDE 0.9 % IV SOLN
1.0000 g | INTRAVENOUS | Status: DC
  Administered 2024-06-30 – 2024-07-03 (×4): 1 g via INTRAVENOUS
  Filled 2024-06-29 (×5): qty 10

## 2024-06-29 MED ORDER — LEVOTHYROXINE SODIUM 75 MCG PO TABS
75.0000 ug | ORAL_TABLET | Freq: Every day | ORAL | Status: DC
  Administered 2024-06-30 – 2024-07-03 (×4): 75 ug via ORAL
  Filled 2024-06-29 (×4): qty 1

## 2024-06-29 NOTE — Hospital Course (Addendum)
 89 year old female with a history of HFpEF, moderate to severe aortic stenosis, pancytopenia, CKD stage IIIb, hypertension, hyperlipidemia, paroxysmal atrial fibrillation, chronic respiratory failure on 2 L, COPD presenting with shortness of breath for the last 3 to 4 days.  The patient has had associated cough and chest congestion producing some white sputum.  She denies any hemoptysis.  She denies any fevers or chills.  She denies any chest pain, nausea, vomiting, diarrhea, abdominal pain, dysuria, hematuria.  She denies any recent antibiotics.  She denies any worsening lower extremity edema. She went to see her outpatient provider on day of admission.  She was noted to be hypoxic on her home oxygen .  As result, the patient was sent to emergency department for further evaluation and treatment. In the ED, the patient was afebrile and hemodynamically stable.  Oxygen  saturation was in the 80s on 2 L with ambulation.  WBC 7.8, hemoglobin 9.0, plates 890.  Sodium 137, potassium 4.1, bicarbonate 37, serum creatinine 1.49.  AST 21, ALT 10, alk phosphatase 61, total bilirubin 0.8.  Chest x-ray showed increase RLL opacity.  There is patchy opacities in the RML and RLL.  VBG showed 7.43/65/46/47.  The patient was started ceftriaxone , azithromycin , and Solu-Medrol  in the emergency department.  HRCT chest 05/2023 >>  Multifocal nodular consolidation in the lungs with septal thickening and ground-glass. Findings may be due to pneumonia. Given nodularity and thickening along the fissures, as well as minimally hyperdense mediastinal adenopathy, consider alveolar sarcoid

## 2024-06-29 NOTE — Plan of Care (Signed)

## 2024-06-29 NOTE — ED Provider Notes (Signed)
 [place] EMERGENCY DEPARTMENT AT [facility] Provider Note   CSN: [id number] Arrival date & time: 06/29/24  1124     Patient presents with: Shortness of Breath   [name] is a 89 y.o. female.   Patient is an 89 year old female who presents to the emergency department with a chief complaint of increased shortness of breath, cough, congestion of white sputum which has been ongoing for approximate the past 5 days.  Patient was seen by her primary care doctor this morning and noted to be hypoxic on her baseline 2 L nasal cannula and was sent to the emergency department for further evaluation.  Patient has had no associated fever or chills.  She has had no chest pain, abdominal pain, nausea, vomiting, diarrhea.  She denies any increased edema to lower extremities.  She does have a history of COPD and CHF.   Shortness of Breath      Prior to Admission medications   Medication Sig Start Date End Date Taking? Authorizing Provider  albuterol  (VENTOLIN  HFA) 108 (90 Base) MCG/ACT inhaler Inhale 2 puffs into the lungs every 6 (six) hours as needed for wheezing or shortness of breath. 09/01/23   Jude Harden GAILS, MD  atorvastatin  (LIPITOR) 20 MG tablet TAKE 1/2 TABLET BY MOUTH DAILY 05/27/24   Jolinda Potter M, DO  Azelastine  HCl 137 MCG/SPRAY SOLN PLACE 1 SPRAY INTO BOTH NOSTRILS 2 (TWO) TIMES DAILY. FOR RUNNY NOSE 06/23/24   Dettinger, Fonda LABOR, MD  bisoprolol  (ZEBETA ) 5 MG tablet TAKE 1 TABLET (5 MG TOTAL) BY MOUTH DAILY. 05/06/24   Jolinda Potter HERO, DO  cloNIDine  (CATAPRES ) 0.1 MG tablet Take 1 tablet (0.1 mg total) by mouth as directed. Take 1 tablet (0.1 mg total) by mouth daily as needed for systolic blood pressure greater than 180. 01/01/24   Lavona Agent, MD  feeding supplement (ENSURE ENLIVE / ENSURE PLUS) LIQD Take 237 mLs by mouth 2 (two) times daily between meals. Patient taking differently: Take 237 mLs by mouth daily as needed (decreased appetite). 08/28/22   Evonnie Lenis, MD  ferrous sulfate  325 (65 FE) MG tablet Take 325 mg by mouth daily with breakfast.    [provider]  flecainide  (TAMBOCOR ) 50 MG tablet TAKE 1 TABLET BY MOUTH TWICE A DAY 05/19/24   Lavona Agent, MD  Fluticasone -Umeclidin-Vilant (TRELEGY ELLIPTA ) 100-62.5-25 MCG/ACT AEPB Inhale 1 puff into the lungs daily. 02/18/24   Jude Harden GAILS, MD  hydrALAZINE  (APRESOLINE ) 50 MG tablet TAKE 1 TABLET BY MOUTH TWICE A DAY 08/04/23   Jolinda Potter M, DO  levothyroxine  (SYNTHROID ) 75 MCG tablet TAKE 1 TABLET BY MOUTH EVERY DAY 04/12/24   Jolinda Potter M, DO  mirtazapine  (REMERON ) 7.5 MG tablet TAKE 1 TABLET BY MOUTH EVERYDAY AT BEDTIME 04/22/24   Harper, Kristen S, PA-C  Multiple Vitamins-Minerals (MULTIVITAMIN WOMEN 50+) TABS Take 1 tablet by mouth at bedtime.    [provider]  pantoprazole  (PROTONIX ) 40 MG tablet TAKE 1 TABLET BY MOUTH EVERY DAY 06/23/24   Shirlean Therisa ORN, NP  torsemide  (DEMADEX ) 20 MG tablet TAKE 1 TABLET BY MOUTH TWICE A DAY 04/06/24   Jolinda Potter HERO, DO    Allergies: Norvasc  [amlodipine ], Tape, Zestril  [lisinopril ], Hytrin  [terazosin ], Imdur  [isosorbide  nitrate], and Sulfonamide derivatives    Review of Systems  Respiratory:  Positive for shortness of breath.   All other systems reviewed and are negative.   Updated Vital Signs BP (!) 171/47   Pulse 69   Temp 98.6 F (  37 C) (Oral)   Resp 20   Ht 5' 5 (1.651 m)   Wt 48.5 kg   SpO2 98%   BMI 17.81 kg/m   Physical Exam Vitals and nursing note reviewed.  Constitutional:      General: She is not in acute distress.    Appearance: Normal appearance. She is not ill-appearing.  HENT:     Head: Normocephalic and atraumatic.     Nose: Nose normal.     Mouth/Throat:     Mouth: Mucous membranes are moist.  Eyes:     Extraocular Movements: Extraocular movements intact.     Conjunctiva/sclera: Conjunctivae normal.     Pupils: Pupils are equal, round, and reactive to light.  Cardiovascular:      Rate and Rhythm: Normal rate and regular rhythm.     Pulses: Normal pulses.     Heart sounds: Normal heart sounds. No murmur heard. Pulmonary:     Effort: Pulmonary effort is normal. Tachypnea present.     Breath sounds: Wheezing and rales present. No decreased breath sounds or rhonchi.  Chest:     Chest wall: No tenderness.  Abdominal:     General: Abdomen is flat. Bowel sounds are normal.     Palpations: Abdomen is soft.  Musculoskeletal:        General: Normal range of motion.     Cervical back: Normal range of motion and neck supple.     Right lower leg: No tenderness. No edema.     Left lower leg: No tenderness. No edema.  Skin:    General: Skin is warm and dry.     Findings: No rash.  Neurological:     General: No focal deficit present.     Mental Status: She is alert and oriented to person, place, and time. Mental status is at baseline.     Cranial Nerves: No cranial nerve deficit.     Motor: No weakness.  Psychiatric:        Mood and Affect: Mood normal.        Behavior: Behavior normal.        Thought Content: Thought content normal.        Judgment: Judgment normal.     (all labs ordered are listed, but only abnormal results are displayed) Labs Reviewed  RESP PANEL BY RT-PCR (RSV, FLU A&B, COVID)  [id number]  CULTURE, BLOOD (ROUTINE X 2)  CULTURE, BLOOD (ROUTINE X 2)  BASIC METABOLIC PANEL WITH GFR  CBC  PRO BRAIN NATRIURETIC PEPTIDE  HEPATIC FUNCTION PANEL  URINALYSIS, ROUTINE W REFLEX MICROSCOPIC  LACTIC ACID, PLASMA  LACTIC ACID, PLASMA  BLOOD GAS, VENOUS  TROPONIN T, HIGH SENSITIVITY    EKG: None  Radiology: No results found.   .Critical Care  Performed by: Daralene Lonni BIRCH, PA-C Authorized by: Daralene Lonni BIRCH, PA-C   Critical care provider statement:    Critical care time (minutes):  35   Critical care was necessary to treat or prevent imminent or life-threatening deterioration of the following conditions:  Respiratory failure    Critical care was time spent personally by me on the following activities:  Development of treatment plan with patient or surrogate, discussions with consultants, evaluation of patient's response to treatment, examination of patient, ordering and review of laboratory studies, ordering and review of radiographic studies, ordering and performing treatments and interventions, pulse oximetry, re-evaluation of patient's condition and review of old charts   I assumed direction of critical care for this patient from another  provider in my specialty: no     Care discussed with: admitting provider      Medications Ordered in the ED  ipratropium-albuterol  (DUONEB) 0.5-2.5 (3) MG/3ML nebulizer solution 3 mL (3 mLs Nebulization Given 06/29/24 1206)  ipratropium-albuterol  (DUONEB) 0.5-2.5 (3) MG/3ML nebulizer solution 3 mL (3 mLs Nebulization Given 06/29/24 1205)  methylPREDNISolone  sodium succinate (SOLU-MEDROL ) 125 mg/2 mL injection 80 mg (80 mg Intravenous Given 06/29/24 1213)                                    Medical Decision Making Amount and/or Complexity of Data Reviewed Labs: ordered. Radiology: ordered.  Risk Prescription drug management. Decision regarding hospitalization.   This patient presents to the ED for concern of shortness of breath, productive cough, this involves an extensive number of treatment options, and is a complaint that carries with it a high risk of complications and morbidity.  The differential diagnosis includes pneumonia, acute viral syndrome, COPD, CHF, ACS, pulmonary embolus, peritonitis, myocarditis   Co morbidities that complicate the patient evaluation  COPD and CHF   Additional history obtained:  Additional history obtained from family External records from outside source obtained and reviewed including medical records   Lab Tests:  I Ordered, and personally interpreted labs.  The pertinent results include: No leukocytosis, anemia at baseline, kidney  function baseline, normal liver function, unremarkable electrolytes, unremarkable VBG, negative lactic acid, elevated BNP, elevated troponin   Imaging Studies ordered:  I ordered imaging studies including chest x-ray I independently visualized and interpreted imaging which showed right lower lobe pneumonia I agree with the radiologist interpretation   Cardiac Monitoring: / EKG:  The patient was maintained on a cardiac monitor.  I personally viewed and interpreted the cardiac monitored which showed an underlying rhythm of: Normal sinus rhythm, no ST/T wave changes, no ischemic changes, no STEMI   Consultations Obtained:  I requested consultation with the hospitalist,  and discussed lab and imaging findings as well as pertinent plan - they recommend: Admission   Problem List / ED Course / Critical interventions / Medication management  Patient is doing well at this time and does remain stable.  Patient does have desaturations into the 80s on her 2 L nasal cannula with ambulation.  She does maintain her oxygen  appropriately on 2 L at rest.  Chest x-ray is consistent with right lower lobe pneumonia.  Blood work is otherwise been unremarkable at this point and is consistent with baseline.  She does not meet sepsis criteria at this time.  Patient does have an elevated BNP but does not overtly look fluid overloaded at this point.  Have given Rocephin  and azithromycin .  Will plan for admission to the hospital service.  Have discussed patient case with Dr. Evonnie with the hospitalist service who has excepted for admission. I ordered medication including Rocephin , azithromycin , Solu-Medrol , DuoNeb for COPD, pneumonia, hypoxic respiratory failure Reevaluation of the patient after these medicines showed that the patient improved I have reviewed the patients home medicines and have made adjustments as needed   Social Determinants of Health:  None   Test / Admission - Considered:  Admission      Final diagnoses:  None    ED Discharge Orders     None          Daralene Lonni BIRCH, PA-C 06/29/24 1421    Franklyn Sid SAILOR, MD 06/29/24 1506

## 2024-06-29 NOTE — Progress Notes (Signed)
 Subjective:  Patient ID: [name], female    DOB: [birth year], 89 y.o.   MRN: [id number]  Patient Care Team: Jolinda Norene HERO, DO as PCP - General (Family Medicine) Lavona Agent, MD as PCP - Cardiology (Cardiology) Izell Domino, MD as Attending Physician (Radiation Oncology) Lanny Callander, MD as Consulting Physician (Hematology) Tobie Gordy POUR, MD as Consulting Physician (Nephrology) [facility] [place] HERO DEVONNA as Physician Assistant (Oncology)   Chief Complaint:  Cough (X 5 days) and Chest Congestion   HPI: [name] is a 89 y.o. female presenting on 06/29/2024 for Cough (X 5 days) and Chest Congestion   [name] is an 89 year old female with lung disease who presents with worsening shortness of breath and low oxygen  levels.  Dyspnea and hypoxemia - Worsening shortness of breath for over five days - Dyspnea is exacerbated by physical activity, such as walking around the house - No dyspnea while sitting - Low oxygen  levels noted - On oxygen  therapy for almost a year, currently using two liters  Cough and sputum production - Cough productive of white phlegm - Using cough syrup to manage symptoms - No breathing treatments performed at home  Constitutional symptoms - Weakness and fatigue present for over five days - Lack of appetite - No fever          Relevant past medical, surgical, family, and social history reviewed and updated as indicated.  Allergies and medications reviewed and updated. Data reviewed: Chart in Epic.   Past Medical History:  Diagnosis Date   Anemia    Arthritis    Asthma    Atrial fibrillation (HCC)    Basal cell carcinoma 02/06/2009   Left ear targus- (MOHS)   Basal cell carcinoma 09/28/2008   Right back-(CX35FU)   Breast cancer (HCC) 2019   invasive and ductal ca   Breast cancer, right (HCC) 04/08/2018   CKD (chronic kidney disease), stage III (HCC)    COPD (chronic obstructive  pulmonary disease) (HCC)    Duodenal ulcer 10/01/2021   Essential hypertension, malignant    Heart murmur    Hyperlipidemia    Hypertension    Hyponatremia 10/14/2015   Hypothyroidism    Nodule of right lung    Right upper lobe   Osteopenia due to cancer therapy 10/07/2022   Pneumonia    PONV (postoperative nausea and vomiting)    Renal insufficiency    Chronic   Renal vascular disease    Right renal artery stenosis 05/09/2015   Squamous cell carcinoma of skin 09/07/2019   KA-Left shin-txpbx   Tear of lateral meniscus of knee 10/27/2017   UGI bleed 08/31/2021   Vertigo     Past Surgical History:  Procedure Laterality Date   BIOPSY  04/09/2021   Procedure: BIOPSY;  Surgeon: Eartha Angelia Sieving, MD;  [facility];  Service: Gastroenterology;;   BREAST LUMPECTOMY WITH RADIOACTIVE SEED LOCALIZATION Right 04/08/2018   Procedure: BREAST LUMPECTOMY WITH RADIOACTIVE SEED LOCALIZATION;  Surgeon: Ebbie Cough, MD;  Location: [facility] OR;  Service: General;  Laterality: Right;   COLONOSCOPY  2016   diverticulosis, hemorrhoids   ESOPHAGOGASTRODUODENOSCOPY  2016   normal   ESOPHAGOGASTRODUODENOSCOPY (EGD) WITH PROPOFOL  N/A 04/09/2021   Procedure: ESOPHAGOGASTRODUODENOSCOPY (EGD) WITH PROPOFOL ;  Surgeon: Eartha Angelia Sieving, MD;  [facility];  Service: Gastroenterology;  Laterality: N/A;  12:45   ESOPHAGOGASTRODUODENOSCOPY (EGD) WITH PROPOFOL  N/A 09/01/2021   Procedure: ESOPHAGOGASTRODUODENOSCOPY (EGD) WITH PROPOFOL ;  Surgeon: Cindie Carlin POUR, DO;  [facility];  Service: Endoscopy;  Laterality: N/A;   IR GENERIC HISTORICAL  08/29/2016   IR US  GUIDE VASC ACCESS RIGHT 08/29/2016 [facility]   IR GENERIC HISTORICAL  08/29/2016   IR RENAL BILAT S&I MOD SED 08/29/2016 [facility]   KNEE ARTHROSCOPY Right    2019   THYROIDECTOMY, PARTIAL Right 1981   middle lobe removed 1st, then right lobe removed 7-8 years later (approx. 43)    Social  History   Socioeconomic History   Marital status: Widowed    Spouse name: Not on file   Number of children: 2   Years of education: 15   Highest education level: Not on file  Occupational History   Occupation: Retired  Tobacco Use   Smoking status: Never   Smokeless tobacco: Never  Vaping Use   Vaping status: Never Used  Substance and Sexual Activity   Alcohol  use: No   Drug use: No   Sexual activity: Not Currently  Other Topics Concern   Not on file  Social History Narrative   Lives alone, but her daughter stays there at night   Ambidextrous (writes with left hand).   No caffeine use.   Social Drivers of Corporate Investment Banker Strain: Low Risk  (12/18/2023)   Overall Financial Resource Strain (CARDIA)    Difficulty of Paying Living Expenses: Not hard at all  Food Insecurity: No Food Insecurity (12/18/2023)   Hunger Vital Sign    Worried About Running Out of Food in the Last Year: Never true    Ran Out of Food in the Last Year: Never true  Transportation Needs: No Transportation Needs (12/18/2023)   PRAPARE - Administrator, Civil Service (Medical): No    Lack of Transportation (Non-Medical): No  Physical Activity: Inactive (12/18/2023)   Exercise Vital Sign    Days of Exercise per Week: 0 days    Minutes of Exercise per Session: 0 min  Stress: No Stress Concern Present (12/18/2023)   [employer] of Occupational Health - Occupational Stress Questionnaire    Feeling of Stress : Not at all  Social Connections: Socially Isolated (12/18/2023)   Social Connection and Isolation Panel    Frequency of Communication with Friends and Family: More than three times a week    Frequency of Social Gatherings with Friends and Family: More than three times a week    Attends Religious Services: Never    Database Administrator or Organizations: No    Attends Banker Meetings: Never    Marital Status: Widowed  Intimate Partner Violence: Not At Risk (12/18/2023)    Humiliation, Afraid, Rape, and Kick questionnaire    Fear of Current or Ex-Partner: No    Emotionally Abused: No    Physically Abused: No    Sexually Abused: No    Outpatient Encounter Medications as of 06/29/2024  Medication Sig   albuterol  (VENTOLIN  HFA) 108 (90 Base) MCG/ACT inhaler Inhale 2 puffs into the lungs every 6 (six) hours as needed for wheezing or shortness of breath.   atorvastatin  (LIPITOR) 20 MG tablet TAKE 1/2 TABLET BY MOUTH DAILY   Azelastine  HCl 137 MCG/SPRAY SOLN PLACE 1 SPRAY INTO BOTH NOSTRILS 2 (TWO) TIMES DAILY. FOR RUNNY NOSE   bisoprolol  (ZEBETA ) 5 MG tablet TAKE 1 TABLET (5 MG TOTAL) BY MOUTH DAILY.   cloNIDine  (CATAPRES ) 0.1 MG tablet Take 1 tablet (0.1 mg total) by mouth as directed. Take  1 tablet (0.1 mg total) by mouth daily as needed for systolic blood pressure greater than 180.   feeding supplement (ENSURE ENLIVE / ENSURE PLUS) LIQD Take 237 mLs by mouth 2 (two) times daily between meals. (Patient taking differently: Take 237 mLs by mouth daily as needed (decreased appetite).)   ferrous sulfate  325 (65 FE) MG tablet Take 325 mg by mouth daily with breakfast.   flecainide  (TAMBOCOR ) 50 MG tablet TAKE 1 TABLET BY MOUTH TWICE A DAY   Fluticasone -Umeclidin-Vilant (TRELEGY ELLIPTA ) 100-62.5-25 MCG/ACT AEPB Inhale 1 puff into the lungs daily.   hydrALAZINE  (APRESOLINE ) 50 MG tablet TAKE 1 TABLET BY MOUTH TWICE A DAY   levothyroxine  (SYNTHROID ) 75 MCG tablet TAKE 1 TABLET BY MOUTH EVERY DAY   mirtazapine  (REMERON ) 7.5 MG tablet TAKE 1 TABLET BY MOUTH EVERYDAY AT BEDTIME   Multiple Vitamins-Minerals (MULTIVITAMIN WOMEN 50+) TABS Take 1 tablet by mouth at bedtime.   pantoprazole  (PROTONIX ) 40 MG tablet TAKE 1 TABLET BY MOUTH EVERY DAY   torsemide  (DEMADEX ) 20 MG tablet TAKE 1 TABLET BY MOUTH TWICE A DAY   No facility-administered encounter medications on file as of 06/29/2024.    Allergies  Allergen Reactions   Norvasc  [Amlodipine ] Swelling    Edema    Tape  Other (See Comments)    Has to have no stick tape.   Zestril  [Lisinopril ] Cough   Hytrin  [Terazosin ] Hives and Nausea Only   Imdur  [Isosorbide  Nitrate] Nausea Only   Sulfonamide Derivatives Nausea And Vomiting and Rash    Pertinent ROS per HPI, otherwise unremarkable      Objective:  BP (!) 156/57   Pulse 77   Temp (!) 97.2 F (36.2 C)   Ht 5' 5 (1.651 m)   Wt 110 lb 12.8 oz (50.3 kg)   SpO2 (!) 87% Comment: 2L of O2  BMI 18.44 kg/m    Wt Readings from Last 3 Encounters:  06/29/24 110 lb 12.8 oz (50.3 kg)  06/08/24 109 lb (49.4 kg)  05/24/24 109 lb 4 oz (49.6 kg)    Physical Exam Vitals and nursing note reviewed.  Constitutional:      General: She is in acute distress.     Appearance: She is ill-appearing. She is not toxic-appearing or diaphoretic.  HENT:     Head: Normocephalic and atraumatic.     Nose: Nose normal.     Mouth/Throat:     Mouth: Mucous membranes are dry.  Eyes:     Conjunctiva/sclera: Conjunctivae normal.     Pupils: Pupils are equal, round, and reactive to light.  Cardiovascular:     Rate and Rhythm: Normal rate and regular rhythm.     Heart sounds: Murmur heard.     Systolic murmur is present with a grade of 4/6.  Pulmonary:     Effort: Accessory muscle usage and prolonged expiration present.     Breath sounds: Decreased air movement present. Decreased breath sounds, wheezing and rhonchi present.     Comments: On supplemental oxygen  at 2L Musculoskeletal:     Cervical back: Neck supple.     Right lower leg: No edema.     Left lower leg: No edema.  Skin:    General: Skin is warm and dry.     Capillary Refill: Capillary refill takes less than 2 seconds.     Coloration: Skin is pale.  Neurological:     General: No focal deficit present.     Mental Status: She is alert.  Psychiatric:  Attention and Perception: Attention and perception normal.        Mood and Affect: Mood and affect normal.        Speech: Speech normal.         Behavior: Behavior is cooperative.        Thought Content: Thought content normal.        Cognition and Memory: Cognition normal. Memory is impaired.        Judgment: Judgment normal.     CURB-65 Score for Pneumonia Severity from [email or web address] on 06/29/2024  RESULT SUMMARY: 3 points Severe risk group: 14.0% 30-day mortality.  Consider inpatient treatment with possible intensive care admission.    Results for orders placed or performed in visit on 06/22/24  Sample to Blood Bank   Collection Time: 06/22/24 11:45 AM  Result Value Ref Range   Blood Bank Specimen SAMPLE AVAILABLE FOR TESTING    Sample Expiration      06/25/2024,2359 Performed at [facility], [address] [place], [place] [address]   CBC   Collection Time: 06/22/24 11:46 AM  Result Value Ref Range   WBC 3.9 (L) 4.0 - 10.5 K/uL   RBC 3.46 (L) 3.87 - 5.11 MIL/uL   Hemoglobin 9.5 (L) 12.0 - 15.0 g/dL   HCT 69.1 (L) 63.9 - 53.9 %   MCV 89.0 80.0 - 100.0 fL   MCH 27.5 26.0 - 34.0 pg   MCHC 30.8 30.0 - 36.0 g/dL   RDW 83.0 (H) 88.4 - 84.4 %   Platelets 111 (L) 150 - 400 K/uL   nRBC 0.0 0.0 - 0.2 %   *Note: Due to a large number of results and/or encounters for the requested time period, some results have not been displayed. A complete set of results can be found in Results Review.       Pertinent labs & imaging results that were available during my care of the patient were reviewed by me and considered in my medical decision making.  Assessment & Plan:  [name] was seen today for cough and chest congestion.  Diagnoses and all orders for this visit:  Shortness of breath at rest  Sputum production  Acute on chronic respiratory failure with hypoxia (HCC)       Acute hypoxic respiratory failure with suspected pneumonia Acute hypoxic respiratory failure with oxygen  saturation at 87% on 2 liters of oxygen . Symptoms have persisted for over five days, including weakness, fatigue, and anorexia. Suspected  pneumonia due to low oxygen  levels and clinical presentation. Previous outpatient management was successful, but current condition is more severe, necessitating hospital admission. - To ED for further management  Shortness of breath and cough with sputum production Shortness of breath and productive cough with white sputum, worsening over the past five days. Symptoms are exacerbated by activity and are associated with low oxygen  saturation levels. Previous outpatient management was successful, but current symptoms suggest a more severe condition. - To ED for further evaluation and management          Continue all other maintenance medications.  Follow up plan: To the ED for evaluation and treatment, likely admission for CAP and hypoxia.     Janet Bruns, FNP-C [facility] [phone]

## 2024-06-29 NOTE — ED Triage Notes (Signed)
 Pt presents with increased SOB since this weekend, sent by [facility] Rouse for r/o Pneumonia, chronically on O2 2 liters for COPD and CHF.

## 2024-06-29 NOTE — H&P (Signed)
 History and Physical    Patient: [name] FMW:[id number] DOB: November 09, 1933 DOA: 06/29/2024 DOS: the patient was seen and examined on 06/29/2024 PCP: Jolinda Norene HERO, DO  Patient coming from: Home  Chief Complaint:  Chief Complaint  Patient presents with   Shortness of Breath   HPI: [name] is a 89 year old female with a history of HFpEF, moderate to severe aortic stenosis, pancytopenia, CKD stage IIIb, hypertension, hyperlipidemia, paroxysmal atrial fibrillation, chronic respiratory failure on 2 L, COPD presenting with shortness of breath for the last 3 to 4 days.  The patient has had associated cough and chest congestion producing some white sputum.  She denies any hemoptysis.  She denies any fevers or chills.  She denies any chest pain, nausea, vomiting, diarrhea, abdominal pain, dysuria, hematuria.  She denies any recent antibiotics.  She denies any worsening lower extremity edema. She went to see her outpatient provider on day of admission.  She was noted to be hypoxic on her home oxygen .  As result, the patient was sent to emergency department for further evaluation and treatment. In the ED, the patient was afebrile and hemodynamically stable.  Oxygen  saturation was in the 80s on 2 L with ambulation.  WBC 7.8, hemoglobin 9.0, plates 890.  Sodium 137, potassium 4.1, bicarbonate 37, serum creatinine 1.49.  AST 21, ALT 10, alk phosphatase 61, total bilirubin 0.8.  Chest x-ray showed increase RLL opacity.  There is patchy opacities in the RML and RLL.  VBG showed 7.43/65/46/47.  The patient was started ceftriaxone , azithromycin , and Solu-Medrol  in the emergency department.  HRCT chest 05/2023 >>  Multifocal nodular consolidation in the lungs with septal thickening and ground-glass. Findings may be due to pneumonia. Given nodularity and thickening along the fissures, as well as minimally hyperdense mediastinal adenopathy, consider alveolar sarcoid  Review of Systems: As mentioned  in the history of present illness. All other systems reviewed and are negative. Past Medical History:  Diagnosis Date   Anemia    Arthritis    Asthma    Atrial fibrillation (HCC)    Basal cell carcinoma 02/06/2009   Left ear targus- (MOHS)   Basal cell carcinoma 09/28/2008   Right back-(CX35FU)   Breast cancer (HCC) 2019   invasive and ductal ca   Breast cancer, right (HCC) 04/08/2018   CKD (chronic kidney disease), stage III (HCC)    COPD (chronic obstructive pulmonary disease) (HCC)    Duodenal ulcer 10/01/2021   Essential hypertension, malignant    Heart murmur    Hyperlipidemia    Hypertension    Hyponatremia 10/14/2015   Hypothyroidism    Nodule of right lung    Right upper lobe   Osteopenia due to cancer therapy 10/07/2022   Pneumonia    PONV (postoperative nausea and vomiting)    Renal insufficiency    Chronic   Renal vascular disease    Right renal artery stenosis 05/09/2015   Squamous cell carcinoma of skin 09/07/2019   KA-Left shin-txpbx   Tear of lateral meniscus of knee 10/27/2017   UGI bleed 08/31/2021   Vertigo    Past Surgical History:  Procedure Laterality Date   BIOPSY  04/09/2021   Procedure: BIOPSY;  Surgeon: Eartha Angelia Sieving, MD;  [facility];  Service: Gastroenterology;;   BREAST LUMPECTOMY WITH RADIOACTIVE SEED LOCALIZATION Right 04/08/2018   Procedure: BREAST LUMPECTOMY WITH RADIOACTIVE SEED LOCALIZATION;  Surgeon: Ebbie Cough, MD;  Location: [facility] OR;  Service: General;  Laterality: Right;   COLONOSCOPY  2016   diverticulosis, hemorrhoids   ESOPHAGOGASTRODUODENOSCOPY  2016   normal   ESOPHAGOGASTRODUODENOSCOPY (EGD) WITH PROPOFOL  N/A 04/09/2021   Procedure: ESOPHAGOGASTRODUODENOSCOPY (EGD) WITH PROPOFOL ;  Surgeon: Eartha Angelia Sieving, MD;  [facility];  Service: Gastroenterology;  Laterality: N/A;  12:45   ESOPHAGOGASTRODUODENOSCOPY (EGD) WITH PROPOFOL  N/A 09/01/2021   Procedure:  ESOPHAGOGASTRODUODENOSCOPY (EGD) WITH PROPOFOL ;  Surgeon: Cindie Carlin POUR, DO;  [facility];  Service: Endoscopy;  Laterality: N/A;   IR GENERIC HISTORICAL  08/29/2016   IR US  GUIDE VASC ACCESS RIGHT 08/29/2016 [facility]   IR GENERIC HISTORICAL  08/29/2016   IR RENAL BILAT S&I MOD SED 08/29/2016 [facility]   KNEE ARTHROSCOPY Right    2019   THYROIDECTOMY, PARTIAL Right 1981   middle lobe removed 1st, then right lobe removed 7-8 years later (approx. 72)   Social History:  reports that she has never smoked. She has never used smokeless tobacco. She reports that she does not drink alcohol  and does not use drugs.  Allergies  Allergen Reactions   Norvasc  [Amlodipine ] Swelling    Edema    Tape Other (See Comments)    Has to have no stick tape.   Zestril  [Lisinopril ] Cough   Hytrin  [Terazosin ] Hives and Nausea Only   Imdur  [Isosorbide  Nitrate] Nausea Only   Sulfonamide Derivatives Nausea And Vomiting and Rash    Family History  Problem Relation Age of Onset   Thyroid  disease Mother    Stroke Father    Hypertension Brother    Lung cancer Maternal Uncle    Cancer Maternal Grandmother        Liver   Lung cancer Maternal Uncle    Hypertension Daughter    Hypercholesterolemia Daughter    Hypercholesterolemia Daughter     Prior to Admission medications   Medication Sig Start Date End Date Taking? Authorizing Provider  atorvastatin  (LIPITOR) 20 MG tablet TAKE 1/2 TABLET BY MOUTH DAILY 05/27/24  Yes Gottschalk, Ashly M, DO  Azelastine  HCl 137 MCG/SPRAY SOLN PLACE 1 SPRAY INTO BOTH NOSTRILS 2 (TWO) TIMES DAILY. FOR RUNNY NOSE 06/23/24  Yes Dettinger, Fonda LABOR, MD  bisoprolol  (ZEBETA ) 5 MG tablet TAKE 1 TABLET (5 MG TOTAL) BY MOUTH DAILY. 05/06/24  Yes Gottschalk, Norene M, DO  Cholecalciferol  (VITAMIN D -3 PO) Take 1 capsule by mouth daily.   Yes [provider]  cloNIDine  (CATAPRES ) 0.1 MG tablet Take 1 tablet (0.1 mg total) by mouth as directed. Take 1 tablet  (0.1 mg total) by mouth daily as needed for systolic blood pressure greater than 180. 01/01/24  Yes Hochrein, Lynwood, MD  feeding supplement (ENSURE ENLIVE / ENSURE PLUS) LIQD Take 237 mLs by mouth 2 (two) times daily between meals. Patient taking differently: Take 237 mLs by mouth daily as needed (decreased appetite). 08/28/22  Yes Joshau Code, Alm, MD  flecainide  (TAMBOCOR ) 50 MG tablet TAKE 1 TABLET BY MOUTH TWICE A DAY 05/19/24  Yes Lavona Lynwood, MD  Fluticasone -Umeclidin-Vilant (TRELEGY ELLIPTA ) 100-62.5-25 MCG/ACT AEPB Inhale 1 puff into the lungs daily. 02/18/24  Yes Jude Harden GAILS, MD  hydrALAZINE  (APRESOLINE ) 50 MG tablet TAKE 1 TABLET BY MOUTH TWICE A DAY 08/04/23  Yes Gottschalk, Ashly M, DO  levothyroxine  (SYNTHROID ) 75 MCG tablet TAKE 1 TABLET BY MOUTH EVERY DAY 04/12/24  Yes Gottschalk, Ashly M, DO  mirtazapine  (REMERON ) 7.5 MG tablet TAKE 1 TABLET BY MOUTH EVERYDAY AT BEDTIME 04/22/24  Yes Harper, Kristen S, PA-C  Multiple Vitamins-Minerals (MULTIVITAMIN WOMEN 50+) TABS Take 1 tablet by  mouth at bedtime.   Yes [provider]  pantoprazole  (PROTONIX ) 40 MG tablet TAKE 1 TABLET BY MOUTH EVERY DAY 06/23/24  Yes Shirlean Therisa ORN, NP  torsemide  (DEMADEX ) 20 MG tablet TAKE 1 TABLET BY MOUTH TWICE A DAY Patient taking differently: Take 40 mg by mouth daily. 04/06/24  Yes Jolinda Norene HERO, DO    Physical Exam: Vitals:   06/29/24 1134 06/29/24 1135 06/29/24 1206 06/29/24 1400  BP:    (!) 153/58  Pulse:    70  Resp:    (!) 26  Temp:      TempSrc:      SpO2:  94% 98% 96%  Weight: 48.5 kg     Height: 5' 5 (1.651 m)      GENERAL:  A&O x 3, NAD, well developed, cooperative, follows commands HEENT: Bailey/AT, No thrush, No icterus, No oral ulcers Neck:  No neck mass, No meningismus, soft, supple CV: RRR, no S3, no S4, no rub, +JVD Lungs: Bilateral rales.  Bilateral expiratory wheeze.  Diminished breath sounds. Abd: soft/NT +BS, nondistended Ext: No edema, no lymphangitis, no cyanosis, no  rashes Neuro:  CN II-XII intact, strength 4/5 in RUE, RLE, strength 4/5 LUE, LLE; sensation intact bilateral; no dysmetria; babinski equivocal  Data Reviewed: Data reviewed above in the history Assessment and Plan: Acute on chronic respiratory failure with hypoxia and hypercarbia - Multifactorial including COPD exacerbation, pneumonia and CHF - Chronically on 2 L nasal cannula at home  COPD exacerbation - Start Brovana  - Start Pulmicort  - Continue IV Solu-Medrol  - Continue DuoNebs -PFTs 06/2023 moderate airway obstruction ratio 65, FEV1 42%, FVC 47%, no bronchodilator response, TLC normal, DLCO 40%  -COVID/RSV/Flu neg -viral resp panel  Lobar pneumonia - Continue ceftriaxone  and azithromycin  - Check PCT - CT chest  Chronic HFpEF - 04/03/2023 echo EF 60 to 65%, no WMA, grade 2 DD, normal RVF - Give 1 dose IV furosemide , then restart p.o. home dose - Daily weights  CKD stage IV - Baseline creatinine 1.1-1.8 - Monitor BMP  Aortic stenosis - Felt to be a high risk candidate after discussion with the patient's cardiologist, Dr. Lavona on 05/14/23 - Being managed conservatively  Paroxysmal atrial fibrillation - Not on anticoagulation secondary to high risk of bleeding from her pancytopenia  Thrombocytopenia/pancytopenia -Patient is felt to have underlying MDS by hematology - pt declined bone marrow biopsy due to advanced age previously, but now reconsidering - pt recently started receiving retacrit /procrit injections every 2 weeks in cancer center and surveillance - continue iron  sulfate, B12 supplementation   Right Breast Cancer - pt seems to be in remission now - she is followed by oncology    Advance Care Planning: FULL  Consults: none  Family Communication: daughter bedside 11/12  Severity of Illness: The appropriate patient status for this patient is INPATIENT. Inpatient status is judged to be reasonable and necessary in order to provide the required intensity  of service to ensure the patient's safety. The patient's presenting symptoms, physical exam findings, and initial radiographic and laboratory data in the context of their chronic comorbidities is felt to place them at high risk for further clinical deterioration. Furthermore, it is not anticipated that the patient will be medically stable for discharge from the hospital within 2 midnights of admission.   * I certify that at the point of admission it is my clinical judgment that the patient will require inpatient hospital care spanning beyond 2 midnights from the point of admission due to high intensity  of service, high risk for further deterioration and high frequency of surveillance required.*  Author: Alm Schneider, MD 06/29/2024 2:33 PM  For on call review [email or web address].

## 2024-06-30 DIAGNOSIS — J441 Chronic obstructive pulmonary disease with (acute) exacerbation: Secondary | ICD-10-CM | POA: Diagnosis not present

## 2024-06-30 DIAGNOSIS — J9621 Acute and chronic respiratory failure with hypoxia: Secondary | ICD-10-CM | POA: Diagnosis not present

## 2024-06-30 DIAGNOSIS — I480 Paroxysmal atrial fibrillation: Secondary | ICD-10-CM | POA: Diagnosis not present

## 2024-06-30 DIAGNOSIS — D696 Thrombocytopenia, unspecified: Secondary | ICD-10-CM | POA: Diagnosis not present

## 2024-06-30 LAB — MAGNESIUM: Magnesium: 2.3 mg/dL (ref 1.7–2.4)

## 2024-06-30 LAB — BASIC METABOLIC PANEL WITH GFR
Anion gap: 9 (ref 5–15)
BUN: 41 mg/dL — ABNORMAL HIGH (ref 8–23)
CO2: 37 mmol/L — ABNORMAL HIGH (ref 22–32)
Calcium: 8.3 mg/dL — ABNORMAL LOW (ref 8.9–10.3)
Chloride: 93 mmol/L — ABNORMAL LOW (ref 98–111)
Creatinine, Ser: 1.72 mg/dL — ABNORMAL HIGH (ref 0.44–1.00)
GFR, Estimated: 28 mL/min — ABNORMAL LOW (ref >60)
Glucose, Bld: 167 mg/dL — ABNORMAL HIGH (ref 70–99)
Potassium: 3.9 mmol/L (ref 3.5–5.1)
Sodium: 138 mmol/L (ref 135–145)

## 2024-06-30 LAB — CBC
HCT: 29.4 % — ABNORMAL LOW (ref 36.0–46.0)
Hemoglobin: 9.1 g/dL — ABNORMAL LOW (ref 12.0–15.0)
MCH: 27.2 pg (ref 26.0–34.0)
MCHC: 31 g/dL (ref 30.0–36.0)
MCV: 88 fL (ref 80.0–100.0)
Platelets: 110 K/uL — ABNORMAL LOW (ref 150–400)
RBC: 3.34 MIL/uL — ABNORMAL LOW (ref 3.87–5.11)
RDW: 16.8 % — ABNORMAL HIGH (ref 11.5–15.5)
WBC: 8.5 K/uL (ref 4.0–10.5)
nRBC: 0 % (ref 0.0–0.2)

## 2024-06-30 MED ORDER — FUROSEMIDE 10 MG/ML IJ SOLN
60.0000 mg | Freq: Once | INTRAMUSCULAR | Status: AC
  Administered 2024-06-30: 60 mg via INTRAVENOUS
  Filled 2024-06-30: qty 6

## 2024-06-30 MED ORDER — REVEFENACIN 175 MCG/3ML IN SOLN
175.0000 ug | Freq: Every day | RESPIRATORY_TRACT | Status: DC
  Administered 2024-07-01 – 2024-07-03 (×3): 175 ug via RESPIRATORY_TRACT
  Filled 2024-06-30 (×3): qty 3

## 2024-06-30 MED ORDER — SENNA 8.6 MG PO TABS
2.0000 | ORAL_TABLET | Freq: Every day | ORAL | Status: DC
  Administered 2024-06-30 – 2024-07-03 (×4): 17.2 mg via ORAL
  Filled 2024-06-30 (×4): qty 2

## 2024-06-30 MED ORDER — ALBUTEROL SULFATE (2.5 MG/3ML) 0.083% IN NEBU
2.5000 mg | INHALATION_SOLUTION | Freq: Four times a day (QID) | RESPIRATORY_TRACT | Status: DC
  Administered 2024-07-01 (×3): 2.5 mg via RESPIRATORY_TRACT
  Filled 2024-06-30 (×3): qty 3

## 2024-06-30 MED ORDER — POLYETHYLENE GLYCOL 3350 17 G PO PACK
17.0000 g | PACK | Freq: Every day | ORAL | Status: DC
  Administered 2024-06-30 – 2024-07-03 (×3): 17 g via ORAL
  Filled 2024-06-30 (×4): qty 1

## 2024-06-30 NOTE — Progress Notes (Signed)
 Nurse at bedside,patient alert and oriented times four.No /o pain or discomfort noted. Patient requesting a stool softener,statesI haven't had a bowel movement since Tuesday.Blood pressure was 153/55,Dr Tat notified. Plan of care on going.

## 2024-06-30 NOTE — Progress Notes (Signed)
 PROGRESS NOTE  [name] FMW:[id number] DOB: 1933/08/26 DOA: 06/29/2024 PCP: Jolinda Norene HERO, DO  Brief History:  89 year old female with a history of HFpEF, moderate to severe aortic stenosis, pancytopenia, CKD stage IIIb, hypertension, hyperlipidemia, paroxysmal atrial fibrillation, chronic respiratory failure on 2 L, COPD presenting with shortness of breath for the last 3 to 4 days.  The patient has had associated cough and chest congestion producing some white sputum.  She denies any hemoptysis.  She denies any fevers or chills.  She denies any chest pain, nausea, vomiting, diarrhea, abdominal pain, dysuria, hematuria.  She denies any recent antibiotics.  She denies any worsening lower extremity edema. She went to see her outpatient provider on day of admission.  She was noted to be hypoxic on her home oxygen .  As result, the patient was sent to emergency department for further evaluation and treatment. In the ED, the patient was afebrile and hemodynamically stable.  Oxygen  saturation was in the 80s on 2 L with ambulation.  WBC 7.8, hemoglobin 9.0, plates 890.  Sodium 137, potassium 4.1, bicarbonate 37, serum creatinine 1.49.  AST 21, ALT 10, alk phosphatase 61, total bilirubin 0.8.  Chest x-ray showed increase RLL opacity.  There is patchy opacities in the RML and RLL.  VBG showed 7.43/65/46/47.  The patient was started ceftriaxone , azithromycin , and Solu-Medrol  in the emergency department.  HRCT chest 05/2023 >>  Multifocal nodular consolidation in the lungs with septal thickening and ground-glass. Findings may be due to pneumonia. Given nodularity and thickening along the fissures, as well as minimally hyperdense mediastinal adenopathy, consider alveolar sarcoid   Assessment/Plan: Acute on chronic respiratory failure with hypoxia and hypercarbia - Multifactorial including COPD exacerbation, pneumonia and CHF - Chronically on 2 L nasal cannula at home   COPD  exacerbation - Continue Brovana  - Continue Pulmicort  - Continue IV Solu-Medrol  - Continue DuoNebs -PFTs 06/2023 moderate airway obstruction ratio 65, FEV1 42%, FVC 47%, no bronchodilator response, TLC normal, DLCO 40%  -COVID/RSV/Flu neg -viral resp panel--net - start yupelri    Lobar pneumonia - Continue ceftriaxone  and azithromycin  - Check PCT 0.23 - CT chest--multifocal patchy/nodular opacities with surrounding GGO bilateral;  interlobular septal thickening at apices;  mod right, trace left effusion   Chronic HFpEF - 04/03/2023 echo EF 60 to 65%, no WMA, grade 2 DD, normal RVF - continue IV lasix  - remains fluid overloaded with JVD - Daily weights - request thoracocentesis 11/14   CKD stage IV - Baseline creatinine 1.5-1.8 - Monitor BMP   Aortic stenosis - Felt to be a high risk candidate after discussion with the patient's cardiologist, Dr. Lavona on 05/14/23 - Being managed conservatively   Paroxysmal atrial fibrillation - Not on anticoagulation secondary to high risk of bleeding from her pancytopenia - currently in sinus rhythm   Thrombocytopenia/pancytopenia -Patient is felt to have underlying MDS by hematology - pt declined bone marrow biopsy due to advanced age previously, but now reconsidering - pt recently started receiving retacrit /procrit injections every 2 weeks in cancer center and surveillance - continue iron  sulfate, B12 supplementation  -d/c [place] heparin , start SCDs   Right Breast Cancer - pt seems to be in remission now - she is followed by oncology      Family Communication:   daughter updated 11/13  Consultants:  none  Code Status:  FULL   DVT Prophylaxis:  SCDs   Procedures: As Listed in Progress Note Above  Antibiotics: Ceftriaxone  11/13>> Azithro  11/13>>     Subjective:   Objective: Vitals:   06/30/24 0844 06/30/24 0911 06/30/24 0915 06/30/24 1337  BP:  (!) 153/55 (!) 153/55 (!) 151/65  Pulse:  81 81 69  Resp:  (!) 25  (!) 25 19  Temp:  98 F (36.7 C) 98 F (36.7 C) 97.9 F (36.6 C)  TempSrc:  Oral Oral   SpO2: 98% 95% 95% 100%  Weight:      Height:        Intake/Output Summary (Last 24 hours) at 06/30/2024 1549 Last data filed at 06/30/2024 1300 Gross per 24 hour  Intake 840 ml  Output 950 ml  Net -110 ml   Weight change:  Exam:  General:  Pt is alert, follows commands appropriately, not in acute distress HEENT: No icterus, No thrush, No neck mass, [place]/AT Cardiovascular: RRR, S1/S2, no rubs, no gallops +JVD Respiratory: bilateral rales.  Bilateral wheeze Abdomen: Soft/+BS, non tender, non distended, no guarding Extremities: No edema, No lymphangitis, No petechiae, No rashes, no synovitis   Data Reviewed: I have personally reviewed following labs and imaging studies Basic Metabolic Panel: Recent Labs  Lab 06/29/24 1146 06/30/24 0429  NA 137 138  K 4.1 3.9  CL 91* 93*  CO2 37* 37*  GLUCOSE 125* 167*  BUN 36* 41*  CREATININE 1.49* 1.72*  CALCIUM  9.0 8.3*  MG  --  2.3   Liver Function Tests: Recent Labs  Lab 06/29/24 1146  AST 21  ALT 10  ALKPHOS 61  BILITOT 0.8  PROT 7.7  ALBUMIN  4.1   No results for input(s): LIPASE, AMYLASE in the last 168 hours. No results for input(s): AMMONIA in the last 168 hours. Coagulation Profile: No results for input(s): INR, PROTIME in the last 168 hours. CBC: Recent Labs  Lab 06/29/24 1146 06/30/24 0429  WBC 7.8 8.5  HGB 9.0* 9.1*  HCT 29.2* 29.4*  MCV 87.7 88.0  PLT 109* 110*   Cardiac Enzymes: No results for input(s): CKTOTAL, CKMB, CKMBINDEX, TROPONINI in the last 168 hours. BNP: Invalid input(s): POCBNP CBG: No results for input(s): GLUCAP in the last 168 hours. HbA1C: No results for input(s): HGBA1C in the last 72 hours. Urine analysis:    Component Value Date/Time   COLORURINE STRAW (A) 11/15/2023 1410   APPEARANCEUR CLEAR 11/15/2023 1410   APPEARANCEUR Clear 10/11/2019 1326   LABSPEC 1.005  11/15/2023 1410   PHURINE 6.0 11/15/2023 1410   GLUCOSEU NEGATIVE 11/15/2023 1410   HGBUR NEGATIVE 11/15/2023 1410   BILIRUBINUR NEGATIVE 11/15/2023 1410   BILIRUBINUR Negative 10/11/2019 1326   KETONESUR NEGATIVE 11/15/2023 1410   PROTEINUR NEGATIVE 11/15/2023 1410   UROBILINOGEN 0.2 11/27/2007 1051   NITRITE NEGATIVE 11/15/2023 1410   LEUKOCYTESUR NEGATIVE 11/15/2023 1410   Sepsis Labs: @LABRCNTIP (procalcitonin:4,lacticidven:4) ) Recent Results (from the past 240 hours)  Resp panel by RT-PCR (RSV, Flu A&B, Covid) Anterior Nasal Swab     Status: None   Collection Time: 06/29/24 11:47 AM   Specimen: Anterior Nasal Swab  Result Value Ref Range Status   SARS Coronavirus 2 by RT PCR NEGATIVE NEGATIVE Final    Comment: (NOTE) SARS-CoV-2 target nucleic acids are NOT DETECTED.  The SARS-CoV-2 RNA is generally detectable in upper respiratory specimens during the acute phase of infection. The lowest concentration of SARS-CoV-2 viral copies this assay can detect is 138 copies/mL. A negative result does not preclude SARS-Cov-2 infection and should not be used as the sole basis for treatment or other patient management decisions. A negative  result may occur with  improper specimen collection/handling, submission of specimen other than nasopharyngeal swab, presence of viral mutation(s) within the areas targeted by this assay, and inadequate number of viral copies(<138 copies/mL). A negative result must be combined with clinical observations, patient history, and epidemiological information. The expected result is Negative.  Fact Sheet for Patients:  bloggercourse.com  Fact Sheet for Healthcare Providers:  seriousbroker.it  This test is no t yet approved or cleared by the [place]  FDA and  has been authorized for detection and/or diagnosis of SARS-CoV-2 by FDA under an Emergency Use Authorization (EUA). This EUA will remain  in  effect (meaning this test can be used) for the duration of the COVID-19 declaration under Section 564(b)(1) of the Act, 21 U.S.C.section 360bbb-3(b)(1), unless the authorization is terminated  or revoked sooner.       Influenza A by PCR NEGATIVE NEGATIVE Final   Influenza B by PCR NEGATIVE NEGATIVE Final    Comment: (NOTE) The Xpert Xpress SARS-CoV-2/FLU/RSV plus assay is intended as an aid in the diagnosis of influenza from Nasopharyngeal swab specimens and should not be used as a sole basis for treatment. Nasal washings and aspirates are unacceptable for Xpert Xpress SARS-CoV-2/FLU/RSV testing.  Fact Sheet for Patients: bloggercourse.com  Fact Sheet for Healthcare Providers: seriousbroker.it  This test is not yet approved or cleared by the [place]  FDA and has been authorized for detection and/or diagnosis of SARS-CoV-2 by FDA under an Emergency Use Authorization (EUA). This EUA will remain in effect (meaning this test can be used) for the duration of the COVID-19 declaration under Section 564(b)(1) of the Act, 21 U.S.C. section 360bbb-3(b)(1), unless the authorization is terminated or revoked.     Resp Syncytial Virus by PCR NEGATIVE NEGATIVE Final    Comment: (NOTE) Fact Sheet for Patients: bloggercourse.com  Fact Sheet for Healthcare Providers: seriousbroker.it  This test is not yet approved or cleared by the [place]  FDA and has been authorized for detection and/or diagnosis of SARS-CoV-2 by FDA under an Emergency Use Authorization (EUA). This EUA will remain in effect (meaning this test can be used) for the duration of the COVID-19 declaration under Section 564(b)(1) of the Act, 21 U.S.C. section 360bbb-3(b)(1), unless the authorization is terminated or revoked.  Performed at [facility], [address] [place], [place] [address]   Culture, blood  (routine x 2)     Status: None (Preliminary result)   Collection Time: 06/29/24 12:15 PM   Specimen: BLOOD  Result Value Ref Range Status   Specimen Description BLOOD LEFT ANTECUBITAL  Final   Special Requests   Final    Blood Culture adequate volume BOTTLES DRAWN AEROBIC AND ANAEROBIC   Culture   Final    NO GROWTH < 24 HOURS Performed at [facility], [address], [place], [place] [address]    Report Status PENDING  Incomplete  Culture, blood (routine x 2)     Status: None (Preliminary result)   Collection Time: 06/29/24 12:15 PM   Specimen: BLOOD  Result Value Ref Range Status   Specimen Description BLOOD RIGHT ASSIST CONTROL  Final   Special Requests   Final    BOTTLES DRAWN AEROBIC AND ANAEROBIC Blood Culture adequate volume   Culture   Final    NO GROWTH < 24 HOURS Performed at [facility], [address] [place], [place] [address]    Report Status PENDING  Incomplete  MRSA Next Gen by PCR, Nasal     Status: Abnormal  Collection Time: 06/29/24  5:20 PM   Specimen: Nasal Mucosa; Nasal Swab  Result Value Ref Range Status   MRSA by PCR Next Gen DETECTED (A) NOT DETECTED Final    Comment: RESULT CALLED TO, READ BACK BY AND VERIFIED WITH: KATIE THOMAS @ 1921 ON 06/29/24 C VARNER (NOTE) The GeneXpert MRSA Assay (FDA approved for NASAL specimens only), is one component of a comprehensive MRSA colonization surveillance program. It is not intended to diagnose MRSA infection nor to guide or monitor treatment for MRSA infections. Test performance is not FDA approved in patients less than 8 years old. Performed at [facility], [address], [place], [place] [address]   Respiratory (~20 pathogens) panel by PCR     Status: None   Collection Time: 06/29/24  5:52 PM   Specimen: Nasopharyngeal Swab; Respiratory  Result Value Ref Range Status   Adenovirus NOT DETECTED NOT DETECTED Final   Coronavirus 229E NOT DETECTED NOT DETECTED Final    Comment: (NOTE) The Coronavirus on  the Respiratory Panel, DOES NOT test for the novel  Coronavirus (2019 nCoV)    Coronavirus HKU1 NOT DETECTED NOT DETECTED Final   Coronavirus NL63 NOT DETECTED NOT DETECTED Final   Coronavirus OC43 NOT DETECTED NOT DETECTED Final   Metapneumovirus NOT DETECTED NOT DETECTED Final   Rhinovirus / Enterovirus NOT DETECTED NOT DETECTED Final   Influenza A NOT DETECTED NOT DETECTED Final   Influenza B NOT DETECTED NOT DETECTED Final   Parainfluenza Virus 1 NOT DETECTED NOT DETECTED Final   Parainfluenza Virus 2 NOT DETECTED NOT DETECTED Final   Parainfluenza Virus 3 NOT DETECTED NOT DETECTED Final   Parainfluenza Virus 4 NOT DETECTED NOT DETECTED Final   Respiratory Syncytial Virus NOT DETECTED NOT DETECTED Final   Bordetella pertussis NOT DETECTED NOT DETECTED Final   Bordetella Parapertussis NOT DETECTED NOT DETECTED Final   Chlamydophila pneumoniae NOT DETECTED NOT DETECTED Final   Mycoplasma pneumoniae NOT DETECTED NOT DETECTED Final    Comment: Performed at [facility], [address] [place], [place] [address]     Scheduled Meds:  arformoterol   15 mcg Nebulization BID   atorvastatin   10 mg Oral Daily   azithromycin   500 mg Oral Daily   bisoprolol   5 mg Oral Daily   budesonide  (PULMICORT ) nebulizer solution  0.5 mg Nebulization BID   cholecalciferol   1,000 Units Oral Daily   flecainide   50 mg Oral BID   furosemide   60 mg Intravenous Once   ipratropium-albuterol   3 mL Nebulization Q6H   levothyroxine   75 mcg Oral Q0600   methylPREDNISolone  (SOLU-MEDROL ) injection  40 mg Intravenous Q12H   mirtazapine   7.5 mg Oral QHS   pantoprazole   40 mg Oral Daily   polyethylene glycol  17 g Oral Daily   senna  2 tablet Oral Daily   Continuous Infusions:  cefTRIAXone  (ROCEPHIN )  IV 1 g (06/30/24 0933)    Procedures/Studies: CT CHEST WO CONTRAST Result Date: 06/29/2024 EXAM: CT CHEST WITHOUT CONTRAST 06/29/2024 06:29:51 PM TECHNIQUE: CT of the chest was performed without the  administration of intravenous contrast. Multiplanar reformatted images are provided for review. Automated exposure control, iterative reconstruction, and/or weight based adjustment of the mA/kV was utilized to reduce the radiation dose to as low as reasonably achievable. COMPARISON: Radiograph earlier today and CT chest dated 12/23/2023. CLINICAL HISTORY: Respiratory illness, nondiagnostic xray. FINDINGS: MEDIASTINUM: Mild cardiomegaly. Mild coronary atherosclerosis of the LAD. Thoracic aortic atherosclerosis. Enlargement of the right main pulmonary artery, suggesting pulmonary  arterial hypertension. LYMPH NODES: Prominent mediastinal nodes, including a dominant 13 mm short axis right tracheal node (emphysema 2), unchanged, likely reactive. No hilar or axillary lymphadenopathy. LUNGS AND PLEURA: Multifocal patchy/nodular opacities with surrounding ground glass in the lungs bilaterally, including a dominant 2.2 cm subpleural patchy opacity in the posterior left lower lobe. This appearance is progressive from the prior, favoring multifocal infection/pneumonia. Mild patchy right lower lobe atelectasis. Interlobuar septal thickening in the lung apices, raising the possibility of superimposed mild interstitial edema. Moderate right and trace left pleural effusions, minimally progressive. No pneumothorax. SOFT TISSUES/BONES: No acute abnormality of the bones or soft tissues. UPPER ABDOMEN: Limited images of the upper abdomen demonstrates no acute abnormality. IMPRESSION: 1. Multifocal pneumonia, new/progressive. 2. Cardiomegaly with suspected mild superimposed interstitial edema. Moderate right and trace left pleural effusions, minimally progressive. 3. Suspected pulmonary arterial hypertension. Electronically signed by: Pinkie Pebbles MD 06/29/2024 08:13 PM EST RP Workstation: [id number]   DG Chest Portable 1 View Result Date: 06/29/2024 CLINICAL DATA:  Shortness of breath past several days. EXAM: PORTABLE CHEST 1  VIEW COMPARISON:  01/14/2024 FINDINGS: Lungs are adequately inflated demonstrate background of chronic interstitial lung disease. Slight worsening opacification of the right base with small right effusion. Possible mild soup 0 spatula airspace density over the right mid to upper lung. Findings concerning for acute infection superimposed on background of interstitial lung disease. Cardiomediastinal silhouette and remainder of the exam is unchanged. IMPRESSION: Slight worsening opacification of the right base with small right effusion. Possible mild airspace density over the right mid to upper lung. Findings concerning for acute infection superimposed on background of interstitial lung disease. Electronically Signed   By: Toribio Agreste M.D.   On: 06/29/2024 13:17    Alm Schneider, DO  [facility]  If 7PM-7AM, please contact night-coverage [email or web address] Password [email or web address] 06/30/2024, 3:49 PM   LOS: 1 day

## 2024-06-30 NOTE — Plan of Care (Signed)
 Alert and oriented x 4. Up 1 assist. Oxygen  at 3L, chronically on 2L at home. Crackles to base. Pt educated on coughing and deep breathing. Last bm 11/10. Vitals stable. Scheduled nebs. Received scheduled solumedrol.  Problem: Education: Goal: Knowledge of General Education information will improve Description: Including pain rating scale, medication(s)/side effects and non-pharmacologic comfort measures Outcome: Progressing   Problem: Health Behavior/Discharge Planning: Goal: Ability to manage health-related needs will improve Outcome: Progressing   Problem: Clinical Measurements: Goal: Ability to maintain clinical measurements within normal limits will improve Outcome: Progressing Goal: Will remain free from infection Outcome: Progressing Goal: Diagnostic test results will improve Outcome: Progressing Goal: Respiratory complications will improve Outcome: Progressing Goal: Cardiovascular complication will be avoided Outcome: Progressing   Problem: Activity: Goal: Risk for activity intolerance will decrease Outcome: Progressing   Problem: Nutrition: Goal: Adequate nutrition will be maintained Outcome: Progressing   Problem: Coping: Goal: Level of anxiety will decrease Outcome: Progressing   Problem: Elimination: Goal: Will not experience complications related to bowel motility Outcome: Progressing Goal: Will not experience complications related to urinary retention Outcome: Progressing   Problem: Pain Managment: Goal: General experience of comfort will improve and/or be controlled Outcome: Progressing   Problem: Safety: Goal: Ability to remain free from injury will improve Outcome: Progressing   Problem: Skin Integrity: Goal: Risk for impaired skin integrity will decrease Outcome: Progressing

## 2024-06-30 NOTE — Plan of Care (Signed)
   Problem: Education: Goal: Knowledge of General Education information will improve Description Including pain rating scale, medication(s)/side effects and non-pharmacologic comfort measures Outcome: Progressing   Problem: Education: Goal: Knowledge of General Education information will improve Description Including pain rating scale, medication(s)/side effects and non-pharmacologic comfort measures Outcome: Progressing

## 2024-06-30 NOTE — TOC Initial Note (Signed)
 Transition of Care [facility]) - Initial/Assessment Note    Patient Details  Name: [name] MRN: [id number] Date of Birth: 10/27/1933  Transition of [facility]) CM/SW Contact:    Lucie Lunger, LCSWA Phone Number: 06/30/2024, 10:33 AM  Clinical Narrative:                 Pt is high risk for readmission. CSW met with pt at bedside to complete assessment. Pt states that she lives alone and her daughter stays with her at night. Pt states she is independent in completing ADLs and drives when needed. Pts daughter are also able to provide transport if needed. Pt has had HH with [facility] in the past and is interested in this being arranged again if needed. Pt wears 2L O2 at baseline. Pt does not use any DME in the home. TOC to follow.   Expected Discharge Plan: Home/Self Care Barriers to Discharge: Continued Medical Work up   Patient Goals and CMS Choice Patient states their goals for this hospitalization and ongoing recovery are:: get better CMS Medicare.gov Compare Post Acute Care list provided to:: Patient Choice offered to / list presented to : Patient      Expected Discharge Plan and Services In-house Referral: Clinical Social Work Discharge Planning Services: CM Consult   Living arrangements for the past 2 months: Single Family Home                                      Prior Living Arrangements/Services Living arrangements for the past 2 months: Single Family Home Lives with:: Self Patient language and need for interpreter reviewed:: Yes Do you feel safe going back to the place where you live?: Yes      Need for Family Participation in Patient Care: Yes (Comment) Care giver support system in place?: Yes (comment) Current home services: DME Criminal Activity/Legal Involvement Pertinent to Current Situation/Hospitalization: No - Comment as needed  Activities of Daily Living      Permission Sought/Granted                  Emotional Assessment Appearance::  Appears stated age Attitude/Demeanor/Rapport: Engaged Affect (typically observed): Accepting Orientation: : Oriented to Self, Oriented to Place, Oriented to  Time, Oriented to Situation Alcohol  / Substance Use: Not Applicable Psych Involvement: No (comment)  Admission diagnosis:  COPD exacerbation (HCC) [J44.1] Acute respiratory failure with hypoxia (HCC) [J96.01] Pneumonia of right lower lobe due to infectious organism [J18.9] Acute on chronic respiratory failure with hypoxia and hypercapnia (HCC) [G03.78, J96.22] Acute on chronic congestive heart failure, unspecified heart failure type [facility]) [I50.9] Patient Active Problem List   Diagnosis Date Noted   Acute on chronic respiratory failure with hypoxia and hypercapnia (HCC) 06/29/2024   COPD with acute exacerbation (HCC) 06/29/2024   PAF (paroxysmal atrial fibrillation) (HCC) 06/29/2024   Chronic heart failure with preserved ejection fraction (HFpEF) (HCC) 06/29/2024   Protein-calorie malnutrition, moderate 05/23/2023   Osteopenia due to cancer therapy 10/07/2022   Heart failure due to valvular disease (HCC) 09/07/2022   GERD without esophagitis 06/17/2022   Anemia due to chronic blood loss 10/01/2021   Anemia of chronic disease 08/31/2021   Failure to thrive in adult 08/31/2021   Mitral valve insufficiency 08/20/2021   History of right breast cancer    Chronic respiratory failure with hypoxia (HCC) 08/01/2021   Constipation 01/23/2021   Osteoarthritis of knee 12/13/2020  History of arthroscopy of knee 12/13/2020   Pain in left knee 12/13/2020   Other specified postprocedural states 12/13/2020   Atrial tachycardia 07/26/2019   Aortic stenosis Mod to Severe 12/01/2018   Malignant neoplasm of lower-outer quadrant of right breast of female, estrogen receptor positive (HCC) 02/25/2018   Vertigo 09/09/2016   Allergic rhinitis 03/17/2016   Renal vascular disease 10/31/2015   Nodule of right lung 10/31/2015   Chronic obstructive  asthma (HCC) 10/31/2015   Hypothyroidism 10/15/2015   Pancytopenia (HCC) 10/15/2015   Thrombocytopenia 06/16/2015   Right renal artery stenosis 05/09/2015   Chronic diastolic heart failure (HCC) 04/03/2014   Paroxysmal atrial fibrillation (HCC) 01/25/2014   Normocytic anemia 07/19/2013   Varicose veins of bilateral lower extremities with other complications 05/03/2013   ABNORMAL STRESS ELECTROCARDIOGRAM 01/23/2010   Mixed hyperlipidemia 12/18/2009   Essential hypertension 12/18/2009   Arthropathy 12/18/2009   PCP:  Jolinda Norene HERO, DO Pharmacy:   [facility] [phone] - [place], [place] - [address] [place] [address] Phone: [phone] Fax: [phone]     Social Drivers of Health (SDOH) Social History: SDOH Screenings   Food Insecurity: No Food Insecurity (06/29/2024)  Housing: Low Risk  (06/29/2024)  Transportation Needs: No Transportation Needs (06/29/2024)  Utilities: Not At Risk (06/29/2024)  Alcohol  Screen: Low Risk  (12/18/2023)  Depression (PHQ2-9): Low Risk  (06/08/2024)  Financial Resource Strain: Low Risk  (12/18/2023)  Physical Activity: Inactive (12/18/2023)  Social Connections: Moderately Integrated (06/29/2024)  Stress: No Stress Concern Present (12/18/2023)  Tobacco Use: Low Risk  (06/29/2024)  Health Literacy: Adequate Health Literacy (12/18/2023)   SDOH Interventions:     Readmission Risk Interventions    06/30/2024   10:32 AM 05/04/2023   11:02 AM 04/03/2023   11:33 AM  Readmission Risk Prevention Plan  Transportation Screening Complete Complete Complete  HRI or Home Care Consult Complete  Complete  Social Work Consult for Recovery Care Planning/Counseling Complete  Complete  Palliative Care Screening Not Applicable  Not Applicable  Medication Review Oceanographer) Complete Complete Complete  HRI or Home Care Consult  Complete   SW Recovery Care/Counseling Consult  Complete   Palliative Care Screening  Not  Applicable   Skilled Nursing Facility  Not Applicable

## 2024-07-01 ENCOUNTER — Encounter (HOSPITAL_COMMUNITY): Payer: Self-pay | Admitting: Internal Medicine

## 2024-07-01 ENCOUNTER — Other Ambulatory Visit (HOSPITAL_COMMUNITY): Payer: Self-pay

## 2024-07-01 ENCOUNTER — Inpatient Hospital Stay (HOSPITAL_COMMUNITY)

## 2024-07-01 ENCOUNTER — Telehealth (HOSPITAL_COMMUNITY): Payer: Self-pay | Admitting: Pharmacy Technician

## 2024-07-01 DIAGNOSIS — I480 Paroxysmal atrial fibrillation: Secondary | ICD-10-CM | POA: Diagnosis not present

## 2024-07-01 DIAGNOSIS — J441 Chronic obstructive pulmonary disease with (acute) exacerbation: Secondary | ICD-10-CM | POA: Diagnosis not present

## 2024-07-01 DIAGNOSIS — J181 Lobar pneumonia, unspecified organism: Secondary | ICD-10-CM

## 2024-07-01 DIAGNOSIS — J9621 Acute and chronic respiratory failure with hypoxia: Secondary | ICD-10-CM | POA: Diagnosis not present

## 2024-07-01 LAB — LACTATE DEHYDROGENASE: LDH: 173 U/L (ref 105–235)

## 2024-07-01 LAB — BODY FLUID CELL COUNT WITH DIFFERENTIAL
Eos, Fluid: 0 %
Lymphs, Fluid: 78 %
Monocyte-Macrophage-Serous Fluid: 9 % — ABNORMAL LOW (ref 50–90)
Neutrophil Count, Fluid: 13 % (ref 0–25)
Total Nucleated Cell Count, Fluid: UNDETERMINED uL (ref 0–1000)

## 2024-07-01 LAB — GRAM STAIN

## 2024-07-01 LAB — CBC
HCT: 27.6 % — ABNORMAL LOW (ref 36.0–46.0)
Hemoglobin: 8.6 g/dL — ABNORMAL LOW (ref 12.0–15.0)
MCH: 27.4 pg (ref 26.0–34.0)
MCHC: 31.2 g/dL (ref 30.0–36.0)
MCV: 87.9 fL (ref 80.0–100.0)
Platelets: 113 K/uL — ABNORMAL LOW (ref 150–400)
RBC: 3.14 MIL/uL — ABNORMAL LOW (ref 3.87–5.11)
RDW: 17.1 % — ABNORMAL HIGH (ref 11.5–15.5)
WBC: 13.6 K/uL — ABNORMAL HIGH (ref 4.0–10.5)
nRBC: 0 % (ref 0.0–0.2)

## 2024-07-01 LAB — BASIC METABOLIC PANEL WITH GFR
Anion gap: 10 (ref 5–15)
BUN: 53 mg/dL — ABNORMAL HIGH (ref 8–23)
CO2: 35 mmol/L — ABNORMAL HIGH (ref 22–32)
Calcium: 8 mg/dL — ABNORMAL LOW (ref 8.9–10.3)
Chloride: 92 mmol/L — ABNORMAL LOW (ref 98–111)
Creatinine, Ser: 1.68 mg/dL — ABNORMAL HIGH (ref 0.44–1.00)
GFR, Estimated: 29 mL/min — ABNORMAL LOW (ref >60)
Glucose, Bld: 169 mg/dL — ABNORMAL HIGH (ref 70–99)
Potassium: 4.5 mmol/L (ref 3.5–5.1)
Sodium: 138 mmol/L (ref 135–145)

## 2024-07-01 LAB — HEPATIC FUNCTION PANEL
ALT: 12 U/L (ref 0–44)
AST: 21 U/L (ref 15–41)
Albumin: 3.8 g/dL (ref 3.5–5.0)
Alkaline Phosphatase: 53 U/L (ref 38–126)
Bilirubin, Direct: 0.2 mg/dL (ref 0.0–0.2)
Indirect Bilirubin: 0.1 mg/dL — ABNORMAL LOW (ref 0.3–0.9)
Total Bilirubin: 0.3 mg/dL (ref 0.0–1.2)
Total Protein: 6.9 g/dL (ref 6.5–8.1)

## 2024-07-01 LAB — URINALYSIS, ROUTINE W REFLEX MICROSCOPIC
Bilirubin Urine: NEGATIVE
Glucose, UA: NEGATIVE mg/dL
Hgb urine dipstick: NEGATIVE
Ketones, ur: NEGATIVE mg/dL
Leukocytes,Ua: NEGATIVE
Nitrite: NEGATIVE
Protein, ur: NEGATIVE mg/dL
Specific Gravity, Urine: 1.008 (ref 1.005–1.030)
pH: 5 (ref 5.0–8.0)

## 2024-07-01 LAB — MAGNESIUM: Magnesium: 2.3 mg/dL (ref 1.7–2.4)

## 2024-07-01 LAB — PROTEIN, PLEURAL OR PERITONEAL FLUID: Total protein, fluid: <3 g/dL

## 2024-07-01 LAB — LACTATE DEHYDROGENASE, PLEURAL OR PERITONEAL FLUID: LD, Fluid: 71 U/L — ABNORMAL HIGH (ref 3–23)

## 2024-07-01 MED ORDER — LIDOCAINE HCL (PF) 2 % IJ SOLN
10.0000 mL | Freq: Once | INTRAMUSCULAR | Status: AC
  Administered 2024-07-01: 10 mL

## 2024-07-01 MED ORDER — ALBUTEROL SULFATE (2.5 MG/3ML) 0.083% IN NEBU
2.5000 mg | INHALATION_SOLUTION | Freq: Three times a day (TID) | RESPIRATORY_TRACT | Status: DC
  Administered 2024-07-02 – 2024-07-03 (×4): 2.5 mg via RESPIRATORY_TRACT
  Filled 2024-07-01 (×4): qty 3

## 2024-07-01 MED ORDER — MUPIROCIN 2 % EX OINT
1.0000 | TOPICAL_OINTMENT | Freq: Two times a day (BID) | CUTANEOUS | Status: DC
  Administered 2024-07-01 – 2024-07-03 (×5): 1 via NASAL
  Filled 2024-07-01: qty 22

## 2024-07-01 MED ORDER — FUROSEMIDE 10 MG/ML IJ SOLN
40.0000 mg | Freq: Once | INTRAMUSCULAR | Status: AC
  Administered 2024-07-01: 40 mg via INTRAVENOUS
  Filled 2024-07-01: qty 4

## 2024-07-01 MED ORDER — CHLORHEXIDINE GLUCONATE CLOTH 2 % EX PADS
6.0000 | MEDICATED_PAD | Freq: Every day | CUTANEOUS | Status: DC
  Administered 2024-07-01 – 2024-07-03 (×3): 6 via TOPICAL

## 2024-07-01 NOTE — Plan of Care (Signed)

## 2024-07-01 NOTE — Progress Notes (Signed)
 Mobility Specialist Progress Note:    07/01/24 1325  Mobility  Activity Ambulated with assistance  Level of Assistance Modified independent, requires aide device or extra time  Assistive Device None  Distance Ambulated (ft) 220 ft  Range of Motion/Exercises Active;All extremities  Activity Response Tolerated well  Mobility Referral Yes  Mobility visit 1 Mobility  Mobility Specialist Start Time (ACUTE ONLY) 1325  Mobility Specialist Stop Time (ACUTE ONLY) 1345  Mobility Specialist Time Calculation (min) (ACUTE ONLY) 20 min   Pt received sitting EOB, agreeable to mobility. ModI to stand and ambulate with no AD. Tolerated well, SpO2 94% on 2L at rest. SpO2 90-96% on 2L during ambulation, stated she was a little short winded. Returned sitting EOB, all needs met.  Cadin Luka Mobility Specialist Please contact via Special Educational Needs Teacher or  Rehab office at [phone]

## 2024-07-01 NOTE — Progress Notes (Signed)
 PROGRESS NOTE  ELICE CRIGGER FMW:[id number] DOB: 1933-11-03 DOA: 06/29/2024 PCP: Jolinda Norene HERO, DO  Brief History:  89 year old female with a history of HFpEF, moderate to severe aortic stenosis, pancytopenia, CKD stage IIIb, hypertension, hyperlipidemia, paroxysmal atrial fibrillation, chronic respiratory failure on 2 L, COPD presenting with shortness of breath for the last 3 to 4 days.  The patient has had associated cough and chest congestion producing some white sputum.  She denies any hemoptysis.  She denies any fevers or chills.  She denies any chest pain, nausea, vomiting, diarrhea, abdominal pain, dysuria, hematuria.  She denies any recent antibiotics.  She denies any worsening lower extremity edema. She went to see her outpatient provider on day of admission.  She was noted to be hypoxic on her home oxygen .  As result, the patient was sent to emergency department for further evaluation and treatment. In the ED, the patient was afebrile and hemodynamically stable.  Oxygen  saturation was in the 80s on 2 L with ambulation.  WBC 7.8, hemoglobin 9.0, plates 890.  Sodium 137, potassium 4.1, bicarbonate 37, serum creatinine 1.49.  AST 21, ALT 10, alk phosphatase 61, total bilirubin 0.8.  Chest x-ray showed increase RLL opacity.  There is patchy opacities in the RML and RLL.  VBG showed 7.43/65/46/47.  The patient was started ceftriaxone , azithromycin , and Solu-Medrol  in the emergency department.  HRCT chest 05/2023 >>  Multifocal nodular consolidation in the lungs with septal thickening and ground-glass. Findings may be due to pneumonia. Given nodularity and thickening along the fissures, as well as minimally hyperdense mediastinal adenopathy, consider alveolar sarcoid   Assessment/Plan: Acute on chronic respiratory failure with hypoxia and hypercarbia - Multifactorial including COPD exacerbation, pneumonia and CHF - Chronically on 2 L nasal cannula at home   COPD  exacerbation - Continue Brovana  - Continue Pulmicort  - Continue IV Solu-Medrol  - Continue DuoNebs -PFTs 06/2023 moderate airway obstruction ratio 65, FEV1 42%, FVC 47%, no bronchodilator response, TLC normal, DLCO 40%  -COVID/RSV/Flu neg -viral resp panel--neg - started yupelri    Lobar pneumonia - Continue ceftriaxone  and azithromycin  - Check PCT 0.23 - CT chest--multifocal patchy/nodular opacities with surrounding GGO bilateral;  interlobular septal thickening at apices;  mod right, trace left effusion   Chronic HFpEF - 04/03/2023 echo EF 60 to 65%, no WMA, grade 2 DD, normal RVF - continue IV lasix  - remains fluid overloaded with JVD - Daily weights-- incomplete - 11/14 R-thoracocentesis--400 cc removed   CKD stage IV - Baseline creatinine 1.5-1.8 - Monitor BMP   Aortic stenosis - Felt to be a high risk candidate after discussion with the patient's cardiologist, Dr. Lavona on 05/14/23 - Being managed conservatively   Paroxysmal atrial fibrillation - Not on anticoagulation secondary to high risk of bleeding from her pancytopenia - currently in sinus rhythm   Thrombocytopenia/pancytopenia -Patient is felt to have underlying MDS by hematology - pt declined bone marrow biopsy due to advanced age previously, but now reconsidering - pt recently started receiving retacrit /procrit injections every 2 weeks in cancer center and surveillance - continue iron  sulfate, B12 supplementation  -d/c [place] heparin , start SCDs   Right Breast Cancer - pt seems to be in remission now - she is followed by oncology           Family Communication:   daughter updated 11/14   Consultants:  none   Code Status:  FULL    DVT Prophylaxis:  SCDs  Procedures: As Listed in Progress Note Above   Antibiotics: Ceftriaxone  11/13>> Azithro 11/13>>          Subjective: Pt states she is breathing a better after thora.  Denies cp, n/v/d, abd pain, f/c  Objective: Vitals:    07/01/24 0804 07/01/24 1020 07/01/24 1037 07/01/24 1351  BP:  (!) 165/56 (!) 154/50 (!) 147/63  Pulse:  69 69 69  Resp:  18 18 20   Temp:  98 F (36.7 C)  98.2 F (36.8 C)  TempSrc:  Oral  Oral  SpO2: 91% 100% 100% 96%  Weight:      Height:        Intake/Output Summary (Last 24 hours) at 07/01/2024 1545 Last data filed at 07/01/2024 1230 Gross per 24 hour  Intake 1120 ml  Output 1350 ml  Net -230 ml   Weight change: 0.565 kg Exam:  General:  Pt is alert, follows commands appropriately, not in acute distress HEENT: No icterus, No thrush, No neck mass, [place]/AT Cardiovascular: RRR, S1/S2, no rubs, no gallops+JVD Respiratory:bibasilar rales. No wheeze.  Diminished BS Abdomen: Soft/+BS, non tender, non distended, no guarding Extremities: No edema, No lymphangitis, No petechiae, No rashes, no synovitis   Data Reviewed: I have personally reviewed following labs and imaging studies Basic Metabolic Panel: Recent Labs  Lab 06/29/24 1146 06/30/24 0429 07/01/24 0405  NA 137 138 138  K 4.1 3.9 4.5  CL 91* 93* 92*  CO2 37* 37* 35*  GLUCOSE 125* 167* 169*  BUN 36* 41* 53*  CREATININE 1.49* 1.72* 1.68*  CALCIUM  9.0 8.3* 8.0*  MG  --  2.3 2.3   Liver Function Tests: Recent Labs  Lab 06/29/24 1146 07/01/24 0405  AST 21 21  ALT 10 12  ALKPHOS 61 53  BILITOT 0.8 0.3  PROT 7.7 6.9  ALBUMIN  4.1 3.8   No results for input(s): LIPASE, AMYLASE in the last 168 hours. No results for input(s): AMMONIA in the last 168 hours. Coagulation Profile: No results for input(s): INR, PROTIME in the last 168 hours. CBC: Recent Labs  Lab 06/29/24 1146 06/30/24 0429 07/01/24 0405  WBC 7.8 8.5 13.6*  HGB 9.0* 9.1* 8.6*  HCT 29.2* 29.4* 27.6*  MCV 87.7 88.0 87.9  PLT 109* 110* 113*   Cardiac Enzymes: No results for input(s): CKTOTAL, CKMB, CKMBINDEX, TROPONINI in the last 168 hours. BNP: Invalid input(s): POCBNP CBG: No results for input(s): GLUCAP in the  last 168 hours. HbA1C: No results for input(s): HGBA1C in the last 72 hours. Urine analysis:    Component Value Date/Time   COLORURINE STRAW (A) 11/15/2023 1410   APPEARANCEUR CLEAR 11/15/2023 1410   APPEARANCEUR Clear 10/11/2019 1326   LABSPEC 1.005 11/15/2023 1410   PHURINE 6.0 11/15/2023 1410   GLUCOSEU NEGATIVE 11/15/2023 1410   HGBUR NEGATIVE 11/15/2023 1410   BILIRUBINUR NEGATIVE 11/15/2023 1410   BILIRUBINUR Negative 10/11/2019 1326   KETONESUR NEGATIVE 11/15/2023 1410   PROTEINUR NEGATIVE 11/15/2023 1410   UROBILINOGEN 0.2 11/27/2007 1051   NITRITE NEGATIVE 11/15/2023 1410   LEUKOCYTESUR NEGATIVE 11/15/2023 1410   Sepsis Labs: @LABRCNTIP (procalcitonin:4,lacticidven:4) ) Recent Results (from the past 240 hours)  Resp panel by RT-PCR (RSV, Flu A&B, Covid) Anterior Nasal Swab     Status: None   Collection Time: 06/29/24 11:47 AM   Specimen: Anterior Nasal Swab  Result Value Ref Range Status   SARS Coronavirus 2 by RT PCR NEGATIVE NEGATIVE Final    Comment: (NOTE) SARS-CoV-2 target nucleic acids are NOT DETECTED.  The  SARS-CoV-2 RNA is generally detectable in upper respiratory specimens during the acute phase of infection. The lowest concentration of SARS-CoV-2 viral copies this assay can detect is 138 copies/mL. A negative result does not preclude SARS-Cov-2 infection and should not be used as the sole basis for treatment or other patient management decisions. A negative result may occur with  improper specimen collection/handling, submission of specimen other than nasopharyngeal swab, presence of viral mutation(s) within the areas targeted by this assay, and inadequate number of viral copies(<138 copies/mL). A negative result must be combined with clinical observations, patient history, and epidemiological information. The expected result is Negative.  Fact Sheet for Patients:  bloggercourse.com  Fact Sheet for Healthcare Providers:   seriousbroker.it  This test is no t yet approved or cleared by the [place]  FDA and  has been authorized for detection and/or diagnosis of SARS-CoV-2 by FDA under an Emergency Use Authorization (EUA). This EUA will remain  in effect (meaning this test can be used) for the duration of the COVID-19 declaration under Section 564(b)(1) of the Act, 21 U.S.C.section 360bbb-3(b)(1), unless the authorization is terminated  or revoked sooner.       Influenza A by PCR NEGATIVE NEGATIVE Final   Influenza B by PCR NEGATIVE NEGATIVE Final    Comment: (NOTE) The Xpert Xpress SARS-CoV-2/FLU/RSV plus assay is intended as an aid in the diagnosis of influenza from Nasopharyngeal swab specimens and should not be used as a sole basis for treatment. Nasal washings and aspirates are unacceptable for Xpert Xpress SARS-CoV-2/FLU/RSV testing.  Fact Sheet for Patients: bloggercourse.com  Fact Sheet for Healthcare Providers: seriousbroker.it  This test is not yet approved or cleared by the [place]  FDA and has been authorized for detection and/or diagnosis of SARS-CoV-2 by FDA under an Emergency Use Authorization (EUA). This EUA will remain in effect (meaning this test can be used) for the duration of the COVID-19 declaration under Section 564(b)(1) of the Act, 21 U.S.C. section 360bbb-3(b)(1), unless the authorization is terminated or revoked.     Resp Syncytial Virus by PCR NEGATIVE NEGATIVE Final    Comment: (NOTE) Fact Sheet for Patients: bloggercourse.com  Fact Sheet for Healthcare Providers: seriousbroker.it  This test is not yet approved or cleared by the [place]  FDA and has been authorized for detection and/or diagnosis of SARS-CoV-2 by FDA under an Emergency Use Authorization (EUA). This EUA will remain in effect (meaning this test can be used) for  the duration of the COVID-19 declaration under Section 564(b)(1) of the Act, 21 U.S.C. section 360bbb-3(b)(1), unless the authorization is terminated or revoked.  Performed at [facility], [address], [place], [place] [address]   Culture, blood (routine x 2)     Status: None (Preliminary result)   Collection Time: 06/29/24 12:15 PM   Specimen: BLOOD  Result Value Ref Range Status   Specimen Description BLOOD LEFT ANTECUBITAL  Final   Special Requests   Final    Blood Culture adequate volume BOTTLES DRAWN AEROBIC AND ANAEROBIC   Culture   Final    NO GROWTH 2 DAYS Performed at [facility], [address] [place], [place] [address]    Report Status PENDING  Incomplete  Culture, blood (routine x 2)     Status: None (Preliminary result)   Collection Time: 06/29/24 12:15 PM   Specimen: BLOOD  Result Value Ref Range Status   Specimen Description BLOOD RIGHT ASSIST CONTROL  Final   Special Requests   Final    BOTTLES DRAWN  AEROBIC AND ANAEROBIC Blood Culture adequate volume   Culture   Final    NO GROWTH 2 DAYS Performed at [facility], [address] [place], [place] [address]    Report Status PENDING  Incomplete  MRSA Next Gen by PCR, Nasal     Status: Abnormal   Collection Time: 06/29/24  5:20 PM   Specimen: Nasal Mucosa; Nasal Swab  Result Value Ref Range Status   MRSA by PCR Next Gen DETECTED (A) NOT DETECTED Final    Comment: RESULT CALLED TO, READ BACK BY AND VERIFIED WITH: KATIE THOMAS @ 1921 ON 06/29/24 C VARNER (NOTE) The GeneXpert MRSA Assay (FDA approved for NASAL specimens only), is one component of a comprehensive MRSA colonization surveillance program. It is not intended to diagnose MRSA infection nor to guide or monitor treatment for MRSA infections. Test performance is not FDA approved in patients less than 59 years old. Performed at [facility], [address], [place], [place] [address]   Respiratory (~20 pathogens) panel by PCR     Status: None    Collection Time: 06/29/24  5:52 PM   Specimen: Nasopharyngeal Swab; Respiratory  Result Value Ref Range Status   Adenovirus NOT DETECTED NOT DETECTED Final   Coronavirus 229E NOT DETECTED NOT DETECTED Final    Comment: (NOTE) The Coronavirus on the Respiratory Panel, DOES NOT test for the novel  Coronavirus (2019 nCoV)    Coronavirus HKU1 NOT DETECTED NOT DETECTED Final   Coronavirus NL63 NOT DETECTED NOT DETECTED Final   Coronavirus OC43 NOT DETECTED NOT DETECTED Final   Metapneumovirus NOT DETECTED NOT DETECTED Final   Rhinovirus / Enterovirus NOT DETECTED NOT DETECTED Final   Influenza A NOT DETECTED NOT DETECTED Final   Influenza B NOT DETECTED NOT DETECTED Final   Parainfluenza Virus 1 NOT DETECTED NOT DETECTED Final   Parainfluenza Virus 2 NOT DETECTED NOT DETECTED Final   Parainfluenza Virus 3 NOT DETECTED NOT DETECTED Final   Parainfluenza Virus 4 NOT DETECTED NOT DETECTED Final   Respiratory Syncytial Virus NOT DETECTED NOT DETECTED Final   Bordetella pertussis NOT DETECTED NOT DETECTED Final   Bordetella Parapertussis NOT DETECTED NOT DETECTED Final   Chlamydophila pneumoniae NOT DETECTED NOT DETECTED Final   Mycoplasma pneumoniae NOT DETECTED NOT DETECTED Final    Comment: Performed at [facility], [address] [place], [place] [address]  Gram stain     Status: None   Collection Time: 07/01/24 10:20 AM   Specimen: Pleura  Result Value Ref Range Status   Specimen Description PLEURAL  Final   Special Requests PLEURAL  Final   Gram Stain   Final    WBC PRESENT, PREDOMINANTLY MONONUCLEAR NO ORGANISMS SEEN CYTOSPIN SMEAR Performed at [facility], [address], [place], [place] [address]    Report Status 07/01/2024 FINAL  Final     Scheduled Meds:  albuterol   2.5 mg Nebulization Q6H   arformoterol   15 mcg Nebulization BID   atorvastatin   10 mg Oral Daily   azithromycin   500 mg Oral Daily   bisoprolol   5 mg Oral Daily   budesonide  (PULMICORT ) nebulizer  solution  0.5 mg Nebulization BID   Chlorhexidine  Gluconate Cloth  6 each Topical Daily   cholecalciferol   1,000 Units Oral Daily   flecainide   50 mg Oral BID   furosemide   40 mg Intravenous Once   levothyroxine   75 mcg Oral Q0600   methylPREDNISolone  (SOLU-MEDROL ) injection  40 mg Intravenous Q12H   mirtazapine   7.5 mg Oral QHS   mupirocin  ointment  1 Application Nasal BID   pantoprazole   40 mg Oral Daily   polyethylene glycol  17 g Oral Daily   revefenacin   175 mcg Nebulization Daily   senna  2 tablet Oral Daily   Continuous Infusions:  cefTRIAXone  (ROCEPHIN )  IV 1 g (07/01/24 0922)    Procedures/Studies: US  THORACENTESIS ASP PLEURAL SPACE W/IMG GUIDE Result Date: 07/01/2024 INDICATION: Patient history of congestive heart failure, chronic right pleural effusion previously managed with medications who is currently admitted for dyspnea with concern for pneumonia. Request for diagnostic and therapeutic right thoracentesis. EXAM: ULTRASOUND GUIDED RIGHT THORACENTESIS MEDICATIONS: 4 mL 1% lidocaine  COMPLICATIONS: None immediate. PROCEDURE: An ultrasound guided thoracentesis was thoroughly discussed with the patient and questions answered. The benefits, risks, alternatives and complications were also discussed. The patient understands and wishes to proceed with the procedure. Written consent was obtained. Ultrasound was performed to localize and mark an adequate pocket of fluid in the right chest. The area was then prepped and draped in the normal sterile fashion. 1% Lidocaine  was used for local anesthesia. Under ultrasound guidance a 19 gauge, 7-cm, Yueh catheter was introduced. Thoracentesis was performed. The catheter was removed and a dressing applied. FINDINGS: A total of approximately 400 mL of serosanguineous fluid was removed. Samples were sent to the laboratory as requested by the clinical team. IMPRESSION: Successful ultrasound guided right thoracentesis yielding 400 mL of pleural fluid.  Performed by Clotilda Hesselbach, PA-C Electronically Signed   By: Ester Sides M.D.   On: 07/01/2024 15:29   DG Chest 1 View Result Date: 07/01/2024 CLINICAL DATA:  Status post right thoracentesis. EXAM: CHEST  1 VIEW COMPARISON:  06/29/2024 FINDINGS: Stable cardiac enlargement. Reduced right pleural volume after thoracentesis. No pneumothorax. Slightly diminished pulmonary interstitial prominence likely related to decreased pulmonary interstitial edema. The visualized skeletal structures are unremarkable. IMPRESSION: Reduced right pleural volume after thoracentesis. No pneumothorax. Decrease in pulmonary interstitial edema. Electronically Signed   By: Marcey Moan M.D.   On: 07/01/2024 11:09   CT CHEST WO CONTRAST Result Date: 06/29/2024 EXAM: CT CHEST WITHOUT CONTRAST 06/29/2024 06:29:51 PM TECHNIQUE: CT of the chest was performed without the administration of intravenous contrast. Multiplanar reformatted images are provided for review. Automated exposure control, iterative reconstruction, and/or weight based adjustment of the mA/kV was utilized to reduce the radiation dose to as low as reasonably achievable. COMPARISON: Radiograph earlier today and CT chest dated 12/23/2023. CLINICAL HISTORY: Respiratory illness, nondiagnostic xray. FINDINGS: MEDIASTINUM: Mild cardiomegaly. Mild coronary atherosclerosis of the LAD. Thoracic aortic atherosclerosis. Enlargement of the right main pulmonary artery, suggesting pulmonary arterial hypertension. LYMPH NODES: Prominent mediastinal nodes, including a dominant 13 mm short axis right tracheal node (emphysema 2), unchanged, likely reactive. No hilar or axillary lymphadenopathy. LUNGS AND PLEURA: Multifocal patchy/nodular opacities with surrounding ground glass in the lungs bilaterally, including a dominant 2.2 cm subpleural patchy opacity in the posterior left lower lobe. This appearance is progressive from the prior, favoring multifocal infection/pneumonia. Mild  patchy right lower lobe atelectasis. Interlobuar septal thickening in the lung apices, raising the possibility of superimposed mild interstitial edema. Moderate right and trace left pleural effusions, minimally progressive. No pneumothorax. SOFT TISSUES/BONES: No acute abnormality of the bones or soft tissues. UPPER ABDOMEN: Limited images of the upper abdomen demonstrates no acute abnormality. IMPRESSION: 1. Multifocal pneumonia, new/progressive. 2. Cardiomegaly with suspected mild superimposed interstitial edema. Moderate right and trace left pleural effusions, minimally progressive. 3. Suspected pulmonary arterial hypertension. Electronically signed by:  Pinkie Pebbles MD 06/29/2024 08:13 PM EST RP Workstation: [id number]   DG Chest Portable 1 View Result Date: 06/29/2024 CLINICAL DATA:  Shortness of breath past several days. EXAM: PORTABLE CHEST 1 VIEW COMPARISON:  01/14/2024 FINDINGS: Lungs are adequately inflated demonstrate background of chronic interstitial lung disease. Slight worsening opacification of the right base with small right effusion. Possible mild soup 0 spatula airspace density over the right mid to upper lung. Findings concerning for acute infection superimposed on background of interstitial lung disease. Cardiomediastinal silhouette and remainder of the exam is unchanged. IMPRESSION: Slight worsening opacification of the right base with small right effusion. Possible mild airspace density over the right mid to upper lung. Findings concerning for acute infection superimposed on background of interstitial lung disease. Electronically Signed   By: Toribio Agreste M.D.   On: 06/29/2024 13:17    Alm Schneider, DO  [facility]  If 7PM-7AM, please contact night-coverage [email or web address] Password [email or web address] 07/01/2024, 3:45 PM   LOS: 2 days

## 2024-07-01 NOTE — Plan of Care (Signed)

## 2024-07-01 NOTE — Telephone Encounter (Signed)
 Patient Product/process Development Scientist completed.    The patient is insured through [employer]/ [employer]. Patient has Medicare and is not eligible for a copay card, but may be able to apply for patient assistance or Medicare RX Payment Plan (Patient Must reach out to their plan, if eligible for payment plan), if available.    Ran test claim for Yupelri  175 mcg/3 ml nebulizer solution and the current 30 day co-pay is $0.00.   This test claim was processed through [facility]- copay amounts may vary at other pharmacies due to pharmacy/plan contracts, or as the patient moves through the different stages of their insurance plan.     Reyes Sharps, CPHT Pharmacy Technician Patient Advocate Specialist Lead [facility] Patient Advocate Team Direct Number: [phone]  Fax: [phone]

## 2024-07-01 NOTE — Care Management Important Message (Signed)
 Important Message  Patient Details  Name: [name] MRN: [id number] Date of Birth: [birth year]   Important Message Given:  Yes - Medicare IM     [place] Dominique L Diar Berkel 07/01/2024, 11:18 AM

## 2024-07-01 NOTE — Procedures (Signed)
 PROCEDURE SUMMARY:  Successful image-guided right thoracentesis. Yielded 400 milliliters of serosanguineous fluid. Patient tolerated procedure well. EBL < 1 mL. No immediate complications.  Specimen was sent for labs. Post procedure CXR shows no pneumothorax.  Please see imaging section of Epic for full dictation.  Clotilda DELENA Hesselbach PA-C 07/01/2024 11:56 AM

## 2024-07-01 NOTE — Progress Notes (Signed)
 Patient tolerated Right Thoracentesis procedure well today and 400 mL of pleural fluid removed and sent to lab for processing. Patient verbalized understanding of post procedure instructions and transported via stretcher to xray at this time for post chest xray with no acute distress noted.

## 2024-07-02 DIAGNOSIS — J9621 Acute and chronic respiratory failure with hypoxia: Secondary | ICD-10-CM | POA: Diagnosis not present

## 2024-07-02 DIAGNOSIS — J441 Chronic obstructive pulmonary disease with (acute) exacerbation: Secondary | ICD-10-CM | POA: Diagnosis not present

## 2024-07-02 DIAGNOSIS — I5033 Acute on chronic diastolic (congestive) heart failure: Secondary | ICD-10-CM | POA: Diagnosis not present

## 2024-07-02 DIAGNOSIS — J9622 Acute and chronic respiratory failure with hypercapnia: Secondary | ICD-10-CM | POA: Diagnosis not present

## 2024-07-02 LAB — BASIC METABOLIC PANEL WITH GFR
Anion gap: 8 (ref 5–15)
BUN: 60 mg/dL — ABNORMAL HIGH (ref 8–23)
CO2: 36 mmol/L — ABNORMAL HIGH (ref 22–32)
Calcium: 7.6 mg/dL — ABNORMAL LOW (ref 8.9–10.3)
Chloride: 95 mmol/L — ABNORMAL LOW (ref 98–111)
Creatinine, Ser: 1.91 mg/dL — ABNORMAL HIGH (ref 0.44–1.00)
GFR, Estimated: 25 mL/min — ABNORMAL LOW (ref >60)
Glucose, Bld: 162 mg/dL — ABNORMAL HIGH (ref 70–99)
Potassium: 4.6 mmol/L (ref 3.5–5.1)
Sodium: 138 mmol/L (ref 135–145)

## 2024-07-02 LAB — MAGNESIUM: Magnesium: 2.4 mg/dL (ref 1.7–2.4)

## 2024-07-02 MED ORDER — TORSEMIDE 20 MG PO TABS
40.0000 mg | ORAL_TABLET | Freq: Every day | ORAL | Status: DC
  Administered 2024-07-02 – 2024-07-03 (×2): 40 mg via ORAL
  Filled 2024-07-02 (×2): qty 2

## 2024-07-02 MED ORDER — HYDRALAZINE HCL 25 MG PO TABS
25.0000 mg | ORAL_TABLET | Freq: Two times a day (BID) | ORAL | Status: DC
  Administered 2024-07-02 – 2024-07-03 (×3): 25 mg via ORAL
  Filled 2024-07-02 (×3): qty 1

## 2024-07-02 MED ORDER — PREDNISONE 20 MG PO TABS
50.0000 mg | ORAL_TABLET | Freq: Every day | ORAL | Status: DC
  Administered 2024-07-03: 50 mg via ORAL
  Filled 2024-07-02: qty 1

## 2024-07-02 NOTE — Plan of Care (Signed)

## 2024-07-02 NOTE — Progress Notes (Addendum)
 PROGRESS NOTE  [name] FMW:[id number] DOB: 01/17/1934 DOA: 06/29/2024 PCP: Jolinda Norene HERO, DO  Brief History:  89 year old female with a history of HFpEF, moderate to severe aortic stenosis, pancytopenia, CKD stage IIIb, hypertension, hyperlipidemia, paroxysmal atrial fibrillation, chronic respiratory failure on 2 L, COPD presenting with shortness of breath for the last 3 to 4 days.  The patient has had associated cough and chest congestion producing some white sputum.  She denies any hemoptysis.  She denies any fevers or chills.  She denies any chest pain, nausea, vomiting, diarrhea, abdominal pain, dysuria, hematuria.  She denies any recent antibiotics.  She denies any worsening lower extremity edema. She went to see her outpatient provider on day of admission.  She was noted to be hypoxic on her home oxygen .  As result, the patient was sent to emergency department for further evaluation and treatment. In the ED, the patient was afebrile and hemodynamically stable.  Oxygen  saturation was in the 80s on 2 L with ambulation.  WBC 7.8, hemoglobin 9.0, plates 890.  Sodium 137, potassium 4.1, bicarbonate 37, serum creatinine 1.49.  AST 21, ALT 10, alk phosphatase 61, total bilirubin 0.8.  Chest x-ray showed increase RLL opacity.  There is patchy opacities in the RML and RLL.  VBG showed 7.43/65/46/47.  The patient was started ceftriaxone , azithromycin , and Solu-Medrol  in the emergency department.  HRCT chest 05/2023 >>  Multifocal nodular consolidation in the lungs with septal thickening and ground-glass. Findings may be due to pneumonia. Given nodularity and thickening along the fissures, as well as minimally hyperdense mediastinal adenopathy, consider alveolar sarcoid   Assessment/Plan: Acute on chronic respiratory failure with hypoxia and hypercarbia - Multifactorial including COPD exacerbation, pneumonia and CHF - initially on 3L - Chronically on 2 L nasal cannula at home    COPD exacerbation - Continue Brovana  - Continue Pulmicort  - Continue IV Solu-Medrol  - Continue DuoNebs -PFTs 06/2023 moderate airway obstruction ratio 65, FEV1 42%, FVC 47%, no bronchodilator response, TLC normal, DLCO 40%  -COVID/RSV/Flu neg -viral resp panel--neg - started yupelri    Lobar pneumonia - Continue ceftriaxone  and azithromycin  - Check PCT 0.23 - CT chest--multifocal patchy/nodular opacities with surrounding GGO bilateral;  interlobular septal thickening at apices;  mod right, trace left effusion   Chronic HFpEF - 04/03/2023 echo EF 60 to 65%, no WMA, grade 2 DD, normal RVF - continue IV lasix  - remains fluid overloaded with JVD - Daily weights-- incomplete - 11/14 R-thoracocentesis--400 cc removed - 07/01/24--pleural effusion transudative by Light's Criteria   CKD stage IV - Baseline creatinine 1.5-1.8 - Monitor BMP   Aortic stenosis - Felt to be a high risk candidate after discussion with the patient's cardiologist, Dr. Lavona on 05/14/23 - Being managed conservatively   Paroxysmal atrial fibrillation - Not on anticoagulation secondary to high risk of bleeding from her pancytopenia - currently in sinus rhythm  Essential HTN -restart amlodipine  and hydralazine    Thrombocytopenia/pancytopenia -Patient is felt to have underlying MDS by hematology - pt declined bone marrow biopsy due to advanced age previously, but now reconsidering - pt recently started receiving retacrit /procrit injections every 2 weeks in cancer center and surveillance - continue iron  sulfate, B12 supplementation  -d/c [place] heparin , start SCDs   Right Breast Cancer - pt seems to be in remission now - she is followed by oncology           Family Communication:   granddaughter updated 11/15   Consultants:  none   Code Status:  FULL    DVT Prophylaxis:  SCDs     Procedures: As Listed in Progress Note Above   Antibiotics: Ceftriaxone  11/13>> Azithro 11/13>>           Subjective: Pt states sob is improving, but has sob with exertion.  Denies f/c, cp, n/v/d, abd pain  Objective: Vitals:   07/02/24 0400 07/02/24 0500 07/02/24 0725 07/02/24 1429  BP: (!) 140/68   (!) 167/65  Pulse: 60   70  Resp:    20  Temp: 97.7 F (36.5 C)   98.2 F (36.8 C)  TempSrc: Oral   Oral  SpO2: 95%  97% 97%  Weight:  49.5 kg    Height:        Intake/Output Summary (Last 24 hours) at 07/02/2024 1707 Last data filed at 07/02/2024 1429 Gross per 24 hour  Intake 960 ml  Output --  Net 960 ml   Weight change: 0.4 kg Exam:  General:  Pt is alert, follows commands appropriately, not in acute distress HEENT: No icterus, No thrush, No neck mass, Croom/AT Cardiovascular: RRR, S1/S2, no rubs, no gallops Respiratory: bibasilar rales. No wheeze Abdomen: Soft/+BS, non tender, non distended, no guarding Extremities: No edema, No lymphangitis, No petechiae, No rashes, no synovitis   Data Reviewed: I have personally reviewed following labs and imaging studies Basic Metabolic Panel: Recent Labs  Lab 06/29/24 1146 06/30/24 0429 07/01/24 0405 07/02/24 0438  NA 137 138 138 138  K 4.1 3.9 4.5 4.6  CL 91* 93* 92* 95*  CO2 37* 37* 35* 36*  GLUCOSE 125* 167* 169* 162*  BUN 36* 41* 53* 60*  CREATININE 1.49* 1.72* 1.68* 1.91*  CALCIUM  9.0 8.3* 8.0* 7.6*  MG  --  2.3 2.3 2.4   Liver Function Tests: Recent Labs  Lab 06/29/24 1146 07/01/24 0405  AST 21 21  ALT 10 12  ALKPHOS 61 53  BILITOT 0.8 0.3  PROT 7.7 6.9  ALBUMIN  4.1 3.8   No results for input(s): LIPASE, AMYLASE in the last 168 hours. No results for input(s): AMMONIA in the last 168 hours. Coagulation Profile: No results for input(s): INR, PROTIME in the last 168 hours. CBC: Recent Labs  Lab 06/29/24 1146 06/30/24 0429 07/01/24 0405  WBC 7.8 8.5 13.6*  HGB 9.0* 9.1* 8.6*  HCT 29.2* 29.4* 27.6*  MCV 87.7 88.0 87.9  PLT 109* 110* 113*   Cardiac Enzymes: No results for  input(s): CKTOTAL, CKMB, CKMBINDEX, TROPONINI in the last 168 hours. BNP: Invalid input(s): POCBNP CBG: No results for input(s): GLUCAP in the last 168 hours. HbA1C: No results for input(s): HGBA1C in the last 72 hours. Urine analysis:    Component Value Date/Time   COLORURINE STRAW (A) 07/01/2024 2129   APPEARANCEUR CLEAR 07/01/2024 2129   APPEARANCEUR Clear 10/11/2019 1326   LABSPEC 1.008 07/01/2024 2129   PHURINE 5.0 07/01/2024 2129   GLUCOSEU NEGATIVE 07/01/2024 2129   HGBUR NEGATIVE 07/01/2024 2129   BILIRUBINUR NEGATIVE 07/01/2024 2129   BILIRUBINUR Negative 10/11/2019 1326   KETONESUR NEGATIVE 07/01/2024 2129   PROTEINUR NEGATIVE 07/01/2024 2129   UROBILINOGEN 0.2 11/27/2007 1051   NITRITE NEGATIVE 07/01/2024 2129   LEUKOCYTESUR NEGATIVE 07/01/2024 2129   Sepsis Labs: @LABRCNTIP (procalcitonin:4,lacticidven:4) ) Recent Results (from the past 240 hours)  Resp panel by RT-PCR (RSV, Flu A&B, Covid) Anterior Nasal Swab     Status: None   Collection Time: 06/29/24 11:47 AM   Specimen: Anterior Nasal Swab  Result Value Ref  Range Status   SARS Coronavirus 2 by RT PCR NEGATIVE NEGATIVE Final    Comment: (NOTE) SARS-CoV-2 target nucleic acids are NOT DETECTED.  The SARS-CoV-2 RNA is generally detectable in upper respiratory specimens during the acute phase of infection. The lowest concentration of SARS-CoV-2 viral copies this assay can detect is 138 copies/mL. A negative result does not preclude SARS-Cov-2 infection and should not be used as the sole basis for treatment or other patient management decisions. A negative result may occur with  improper specimen collection/handling, submission of specimen other than nasopharyngeal swab, presence of viral mutation(s) within the areas targeted by this assay, and inadequate number of viral copies(<138 copies/mL). A negative result must be combined with clinical observations, patient history, and  epidemiological information. The expected result is Negative.  Fact Sheet for Patients:  bloggercourse.com  Fact Sheet for Healthcare Providers:  seriousbroker.it  This test is no t yet approved or cleared by the [place]  FDA and  has been authorized for detection and/or diagnosis of SARS-CoV-2 by FDA under an Emergency Use Authorization (EUA). This EUA will remain  in effect (meaning this test can be used) for the duration of the COVID-19 declaration under Section 564(b)(1) of the Act, 21 U.S.C.section 360bbb-3(b)(1), unless the authorization is terminated  or revoked sooner.       Influenza A by PCR NEGATIVE NEGATIVE Final   Influenza B by PCR NEGATIVE NEGATIVE Final    Comment: (NOTE) The Xpert Xpress SARS-CoV-2/FLU/RSV plus assay is intended as an aid in the diagnosis of influenza from Nasopharyngeal swab specimens and should not be used as a sole basis for treatment. Nasal washings and aspirates are unacceptable for Xpert Xpress SARS-CoV-2/FLU/RSV testing.  Fact Sheet for Patients: bloggercourse.com  Fact Sheet for Healthcare Providers: seriousbroker.it  This test is not yet approved or cleared by the [place]  FDA and has been authorized for detection and/or diagnosis of SARS-CoV-2 by FDA under an Emergency Use Authorization (EUA). This EUA will remain in effect (meaning this test can be used) for the duration of the COVID-19 declaration under Section 564(b)(1) of the Act, 21 U.S.C. section 360bbb-3(b)(1), unless the authorization is terminated or revoked.     Resp Syncytial Virus by PCR NEGATIVE NEGATIVE Final    Comment: (NOTE) Fact Sheet for Patients: bloggercourse.com  Fact Sheet for Healthcare Providers: seriousbroker.it  This test is not yet approved or cleared by the [place]  FDA and has been  authorized for detection and/or diagnosis of SARS-CoV-2 by FDA under an Emergency Use Authorization (EUA). This EUA will remain in effect (meaning this test can be used) for the duration of the COVID-19 declaration under Section 564(b)(1) of the Act, 21 U.S.C. section 360bbb-3(b)(1), unless the authorization is terminated or revoked.  Performed at [facility], [address] [place], [place] [address]   Culture, blood (routine x 2)     Status: None (Preliminary result)   Collection Time: 06/29/24 12:15 PM   Specimen: BLOOD  Result Value Ref Range Status   Specimen Description BLOOD LEFT ANTECUBITAL  Final   Special Requests   Final    Blood Culture adequate volume BOTTLES DRAWN AEROBIC AND ANAEROBIC   Culture   Final    NO GROWTH 3 DAYS Performed at [facility], [address] [place], [place] [address]    Report Status PENDING  Incomplete  Culture, blood (routine x 2)     Status: None (Preliminary result)   Collection Time: 06/29/24 12:15 PM   Specimen: BLOOD  Result Value Ref Range Status   Specimen Description BLOOD RIGHT ASSIST CONTROL  Final   Special Requests   Final    BOTTLES DRAWN AEROBIC AND ANAEROBIC Blood Culture adequate volume   Culture   Final    NO GROWTH 3 DAYS Performed at [facility], [address], [place], [place] [address]    Report Status PENDING  Incomplete  MRSA Next Gen by PCR, Nasal     Status: Abnormal   Collection Time: 06/29/24  5:20 PM   Specimen: Nasal Mucosa; Nasal Swab  Result Value Ref Range Status   MRSA by PCR Next Gen DETECTED (A) NOT DETECTED Final    Comment: RESULT CALLED TO, READ BACK BY AND VERIFIED WITH: KATIE THOMAS @ 1921 ON 06/29/24 C VARNER (NOTE) The GeneXpert MRSA Assay (FDA approved for NASAL specimens only), is one component of a comprehensive MRSA colonization surveillance program. It is not intended to diagnose MRSA infection nor to guide or monitor treatment for MRSA infections. Test performance is not FDA approved  in patients less than 68 years old. Performed at [facility], [address] [place], [place] [address]   Respiratory (~20 pathogens) panel by PCR     Status: None   Collection Time: 06/29/24  5:52 PM   Specimen: Nasopharyngeal Swab; Respiratory  Result Value Ref Range Status   Adenovirus NOT DETECTED NOT DETECTED Final   Coronavirus 229E NOT DETECTED NOT DETECTED Final    Comment: (NOTE) The Coronavirus on the Respiratory Panel, DOES NOT test for the novel  Coronavirus (2019 nCoV)    Coronavirus HKU1 NOT DETECTED NOT DETECTED Final   Coronavirus NL63 NOT DETECTED NOT DETECTED Final   Coronavirus OC43 NOT DETECTED NOT DETECTED Final   Metapneumovirus NOT DETECTED NOT DETECTED Final   Rhinovirus / Enterovirus NOT DETECTED NOT DETECTED Final   Influenza A NOT DETECTED NOT DETECTED Final   Influenza B NOT DETECTED NOT DETECTED Final   Parainfluenza Virus 1 NOT DETECTED NOT DETECTED Final   Parainfluenza Virus 2 NOT DETECTED NOT DETECTED Final   Parainfluenza Virus 3 NOT DETECTED NOT DETECTED Final   Parainfluenza Virus 4 NOT DETECTED NOT DETECTED Final   Respiratory Syncytial Virus NOT DETECTED NOT DETECTED Final   Bordetella pertussis NOT DETECTED NOT DETECTED Final   Bordetella Parapertussis NOT DETECTED NOT DETECTED Final   Chlamydophila pneumoniae NOT DETECTED NOT DETECTED Final   Mycoplasma pneumoniae NOT DETECTED NOT DETECTED Final    Comment: Performed at [facility], [address], [place], [place] [address]  Culture, body fluid w Gram Stain-bottle     Status: None (Preliminary result)   Collection Time: 07/01/24 10:20 AM   Specimen: Pleura  Result Value Ref Range Status   Specimen Description PLEURAL  Final   Special Requests 10CC BOTTLES DRAWN AEROBIC AND ANAEROBIC  Final   Culture   Final    NO GROWTH < 24 HOURS Performed at [facility], [address] [place], [place] [address]    Report Status PENDING  Incomplete  Gram stain     Status: None   Collection  Time: 07/01/24 10:20 AM   Specimen: Pleura  Result Value Ref Range Status   Specimen Description PLEURAL  Final   Special Requests PLEURAL  Final   Gram Stain   Final    WBC PRESENT, PREDOMINANTLY MONONUCLEAR NO ORGANISMS SEEN CYTOSPIN SMEAR Performed at [facility], [address], [place], [place] [address]    Report Status 07/01/2024 FINAL  Final  Scheduled Meds:  albuterol   2.5 mg Nebulization TID   arformoterol   15 mcg Nebulization BID   atorvastatin   10 mg Oral Daily   azithromycin   500 mg Oral Daily   bisoprolol   5 mg Oral Daily   budesonide  (PULMICORT ) nebulizer solution  0.5 mg Nebulization BID   Chlorhexidine  Gluconate Cloth  6 each Topical Daily   cholecalciferol   1,000 Units Oral Daily   flecainide   50 mg Oral BID   hydrALAZINE   25 mg Oral BID   levothyroxine   75 mcg Oral Q0600   methylPREDNISolone  (SOLU-MEDROL ) injection  40 mg Intravenous Q12H   mirtazapine   7.5 mg Oral QHS   mupirocin  ointment  1 Application Nasal BID   pantoprazole   40 mg Oral Daily   polyethylene glycol  17 g Oral Daily   revefenacin   175 mcg Nebulization Daily   senna  2 tablet Oral Daily   torsemide   40 mg Oral Daily   Continuous Infusions:  cefTRIAXone  (ROCEPHIN )  IV 1 g (07/02/24 0836)    Procedures/Studies: US  THORACENTESIS ASP PLEURAL SPACE W/IMG GUIDE Result Date: 07/01/2024 INDICATION: Patient history of congestive heart failure, chronic right pleural effusion previously managed with medications who is currently admitted for dyspnea with concern for pneumonia. Request for diagnostic and therapeutic right thoracentesis. EXAM: ULTRASOUND GUIDED RIGHT THORACENTESIS MEDICATIONS: 4 mL 1% lidocaine  COMPLICATIONS: None immediate. PROCEDURE: An ultrasound guided thoracentesis was thoroughly discussed with the patient and questions answered. The benefits, risks, alternatives and complications were also discussed. The patient understands and wishes to proceed with the procedure. Written  consent was obtained. Ultrasound was performed to localize and mark an adequate pocket of fluid in the right chest. The area was then prepped and draped in the normal sterile fashion. 1% Lidocaine  was used for local anesthesia. Under ultrasound guidance a 19 gauge, 7-cm, Yueh catheter was introduced. Thoracentesis was performed. The catheter was removed and a dressing applied. FINDINGS: A total of approximately 400 mL of serosanguineous fluid was removed. Samples were sent to the laboratory as requested by the clinical team. IMPRESSION: Successful ultrasound guided right thoracentesis yielding 400 mL of pleural fluid. Performed by Clotilda Hesselbach, PA-C Electronically Signed   By: Ester Sides M.D.   On: 07/01/2024 15:29   DG Chest 1 View Result Date: 07/01/2024 CLINICAL DATA:  Status post right thoracentesis. EXAM: CHEST  1 VIEW COMPARISON:  06/29/2024 FINDINGS: Stable cardiac enlargement. Reduced right pleural volume after thoracentesis. No pneumothorax. Slightly diminished pulmonary interstitial prominence likely related to decreased pulmonary interstitial edema. The visualized skeletal structures are unremarkable. IMPRESSION: Reduced right pleural volume after thoracentesis. No pneumothorax. Decrease in pulmonary interstitial edema. Electronically Signed   By: Marcey Moan M.D.   On: 07/01/2024 11:09   CT CHEST WO CONTRAST Result Date: 06/29/2024 EXAM: CT CHEST WITHOUT CONTRAST 06/29/2024 06:29:51 PM TECHNIQUE: CT of the chest was performed without the administration of intravenous contrast. Multiplanar reformatted images are provided for review. Automated exposure control, iterative reconstruction, and/or weight based adjustment of the mA/kV was utilized to reduce the radiation dose to as low as reasonably achievable. COMPARISON: Radiograph earlier today and CT chest dated 12/23/2023. CLINICAL HISTORY: Respiratory illness, nondiagnostic xray. FINDINGS: MEDIASTINUM: Mild cardiomegaly. Mild coronary  atherosclerosis of the LAD. Thoracic aortic atherosclerosis. Enlargement of the right main pulmonary artery, suggesting pulmonary arterial hypertension. LYMPH NODES: Prominent mediastinal nodes, including a dominant 13 mm short axis right tracheal node (emphysema 2), unchanged, likely reactive. No hilar or axillary lymphadenopathy. LUNGS AND PLEURA: Multifocal patchy/nodular opacities with  surrounding ground glass in the lungs bilaterally, including a dominant 2.2 cm subpleural patchy opacity in the posterior left lower lobe. This appearance is progressive from the prior, favoring multifocal infection/pneumonia. Mild patchy right lower lobe atelectasis. Interlobuar septal thickening in the lung apices, raising the possibility of superimposed mild interstitial edema. Moderate right and trace left pleural effusions, minimally progressive. No pneumothorax. SOFT TISSUES/BONES: No acute abnormality of the bones or soft tissues. UPPER ABDOMEN: Limited images of the upper abdomen demonstrates no acute abnormality. IMPRESSION: 1. Multifocal pneumonia, new/progressive. 2. Cardiomegaly with suspected mild superimposed interstitial edema. Moderate right and trace left pleural effusions, minimally progressive. 3. Suspected pulmonary arterial hypertension. Electronically signed by: Pinkie Pebbles MD 06/29/2024 08:13 PM EST RP Workstation: [id number]   DG Chest Portable 1 View Result Date: 06/29/2024 CLINICAL DATA:  Shortness of breath past several days. EXAM: PORTABLE CHEST 1 VIEW COMPARISON:  01/14/2024 FINDINGS: Lungs are adequately inflated demonstrate background of chronic interstitial lung disease. Slight worsening opacification of the right base with small right effusion. Possible mild soup 0 spatula airspace density over the right mid to upper lung. Findings concerning for acute infection superimposed on background of interstitial lung disease. Cardiomediastinal silhouette and remainder of the exam is unchanged.  IMPRESSION: Slight worsening opacification of the right base with small right effusion. Possible mild airspace density over the right mid to upper lung. Findings concerning for acute infection superimposed on background of interstitial lung disease. Electronically Signed   By: Toribio Agreste M.D.   On: 06/29/2024 13:17    Alm Schneider, DO  [facility]  If 7PM-7AM, please contact night-coverage [email or web address] Password [email or web address] 07/02/2024, 5:07 PM   LOS: 3 days

## 2024-07-03 DIAGNOSIS — J181 Lobar pneumonia, unspecified organism: Secondary | ICD-10-CM | POA: Diagnosis not present

## 2024-07-03 DIAGNOSIS — I480 Paroxysmal atrial fibrillation: Secondary | ICD-10-CM | POA: Diagnosis not present

## 2024-07-03 DIAGNOSIS — J9621 Acute and chronic respiratory failure with hypoxia: Secondary | ICD-10-CM | POA: Diagnosis not present

## 2024-07-03 DIAGNOSIS — D696 Thrombocytopenia, unspecified: Secondary | ICD-10-CM | POA: Diagnosis not present

## 2024-07-03 LAB — CBC
HCT: 26.1 % — ABNORMAL LOW (ref 36.0–46.0)
Hemoglobin: 8 g/dL — ABNORMAL LOW (ref 12.0–15.0)
MCH: 26.8 pg (ref 26.0–34.0)
MCHC: 30.7 g/dL (ref 30.0–36.0)
MCV: 87.3 fL (ref 80.0–100.0)
Platelets: 109 K/uL — ABNORMAL LOW (ref 150–400)
RBC: 2.99 MIL/uL — ABNORMAL LOW (ref 3.87–5.11)
RDW: 16.7 % — ABNORMAL HIGH (ref 11.5–15.5)
WBC: 11.3 K/uL — ABNORMAL HIGH (ref 4.0–10.5)
nRBC: 0 % (ref 0.0–0.2)

## 2024-07-03 LAB — VITAMIN B12: Vitamin B-12: 1132 pg/mL — ABNORMAL HIGH (ref 180–914)

## 2024-07-03 LAB — BASIC METABOLIC PANEL WITH GFR
Anion gap: 8 (ref 5–15)
BUN: 58 mg/dL — ABNORMAL HIGH (ref 8–23)
CO2: 36 mmol/L — ABNORMAL HIGH (ref 22–32)
Calcium: 7.4 mg/dL — ABNORMAL LOW (ref 8.9–10.3)
Chloride: 95 mmol/L — ABNORMAL LOW (ref 98–111)
Creatinine, Ser: 2.07 mg/dL — ABNORMAL HIGH (ref 0.44–1.00)
GFR, Estimated: 23 mL/min — ABNORMAL LOW (ref >60)
Glucose, Bld: 105 mg/dL — ABNORMAL HIGH (ref 70–99)
Potassium: 4.4 mmol/L (ref 3.5–5.1)
Sodium: 139 mmol/L (ref 135–145)

## 2024-07-03 LAB — MAGNESIUM: Magnesium: 2.3 mg/dL (ref 1.7–2.4)

## 2024-07-03 LAB — IRON AND TIBC
Iron: 35 ug/dL (ref 28–170)
Saturation Ratios: 15 % (ref 10.4–31.8)
TIBC: 241 ug/dL — ABNORMAL LOW (ref 250–450)
UIBC: 206 ug/dL

## 2024-07-03 LAB — FOLATE: Folate: 17.7 ng/mL (ref >5.9)

## 2024-07-03 MED ORDER — FERROUS SULFATE 325 (65 FE) MG PO TABS
325.0000 mg | ORAL_TABLET | Freq: Every day | ORAL | Status: DC
  Administered 2024-07-03: 325 mg via ORAL
  Filled 2024-07-03: qty 1

## 2024-07-03 MED ORDER — PREDNISONE 10 MG PO TABS: 50.0000 mg | ORAL_TABLET | Freq: Every day | ORAL | 0 refills | Status: DC

## 2024-07-03 MED ORDER — FERROUS SULFATE 325 (65 FE) MG PO TABS: 325.0000 mg | ORAL_TABLET | Freq: Every day | ORAL | Status: AC

## 2024-07-03 NOTE — Plan of Care (Signed)
   Problem: Education: Goal: Knowledge of General Education information will improve Description Including pain rating scale, medication(s)/side effects and non-pharmacologic comfort measures Outcome: Progressing   Problem: Clinical Measurements: Goal: Diagnostic test results will improve Outcome: Progressing Goal: Respiratory complications will improve Outcome: Progressing

## 2024-07-03 NOTE — Discharge Summary (Signed)
 Physician Discharge Summary   Patient: [name] MRN: [id number] DOB: Sep 11, 1933  Admit date:     06/29/2024  Discharge date: 07/03/24  Discharge Physician: Alm Anairis Knick   PCP: Jolinda Norene HERO, DO   Recommendations at discharge:   Please follow up with primary care provider within 1-2 weeks  Please repeat BMP and CBC in one week    Hospital Course: 89 year old female with a history of HFpEF, moderate to severe aortic stenosis, pancytopenia, CKD stage IIIb, hypertension, hyperlipidemia, paroxysmal atrial fibrillation, chronic respiratory failure on 2 L, COPD presenting with shortness of breath for the last 3 to 4 days.  The patient has had associated cough and chest congestion producing some white sputum.  She denies any hemoptysis.  She denies any fevers or chills.  She denies any chest pain, nausea, vomiting, diarrhea, abdominal pain, dysuria, hematuria.  She denies any recent antibiotics.  She denies any worsening lower extremity edema. She went to see her outpatient provider on day of admission.  She was noted to be hypoxic on her home oxygen .  As result, the patient was sent to emergency department for further evaluation and treatment. In the ED, the patient was afebrile and hemodynamically stable.  Oxygen  saturation was in the 80s on 2 L with ambulation.  WBC 7.8, hemoglobin 9.0, plates 890.  Sodium 137, potassium 4.1, bicarbonate 37, serum creatinine 1.49.  AST 21, ALT 10, alk phosphatase 61, total bilirubin 0.8.  Chest x-ray showed increase RLL opacity.  There is patchy opacities in the RML and RLL.  VBG showed 7.43/65/46/47.  The patient was started ceftriaxone , azithromycin , and Solu-Medrol  in the emergency department.  HRCT chest 05/2023 >>  Multifocal nodular consolidation in the lungs with septal thickening and ground-glass. Findings may be due to pneumonia. Given nodularity and thickening along the fissures, as well as minimally hyperdense mediastinal adenopathy, consider  alveolar sarcoid  Assessment and Plan:  Acute on chronic respiratory failure with hypoxia and hypercarbia - Multifactorial including COPD exacerbation, pneumonia and CHF - initially on 3L - Chronically on 2 L nasal cannula at home - back down to 2 L at time of d/c   COPD exacerbation - Continue Brovana  - Continue Pulmicort  - Continue IV Solu-Medrol  - Continue DuoNebs -PFTs 06/2023 moderate airway obstruction ratio 65, FEV1 42%, FVC 47%, no bronchodilator response, TLC normal, DLCO 40%  -COVID/RSV/Flu neg -viral resp panel--neg - started yupelri  - d/c home with prednisone  taper   Lobar pneumonia - Continue ceftriaxone  and azithromycin >>finished 5 days during hospitalization - Check PCT 0.23 - CT chest--multifocal patchy/nodular opacities with surrounding GGO bilateral;  interlobular septal thickening at apices;  mod right, trace left effusion   Chronic HFpEF - 04/03/2023 echo EF 60 to 65%, no WMA, grade 2 DD, normal RVF - continue IV lasix >>transitioned back to home torsemide  40 mg daily - Daily weights-- incomplete - 11/14 R-thoracocentesis--400 cc removed - 07/01/24--pleural effusion transudative by Light's Criteria - I instructed pt to restart torsemide  40 mg daily on 07/05/24   CKD stage IV - Baseline creatinine 1.5-1.8 - Monitor BMP - serum creatinine 2.08 on day of dc   Aortic stenosis - Felt to be a high risk candidate after discussion with the patient's cardiologist, Dr. Lavona on 05/14/23 - Being managed conservatively   Paroxysmal atrial fibrillation - Not on anticoagulation secondary to high risk of bleeding from her pancytopenia - currently in sinus rhythm   Essential HTN -restart amlodipine  and hydralazine    Thrombocytopenia/pancytopenia -Patient is felt to have underlying MDS  by hematology - pt declined bone marrow biopsy due to advanced age previously, but now reconsidering - pt recently started receiving retacrit /procrit injections every 2 weeks in  cancer center and surveillance - continue iron  sulfate, B12 supplementation  -d/c [place] heparin , start SCDs   Right Breast Cancer - pt seems to be in remission now - she is followed by oncology         Consultants: none Procedures performed: none  Disposition: Home Diet recommendation:  Cardiac diet DISCHARGE MEDICATION: Allergies as of 07/03/2024       Reactions   Norvasc  [amlodipine ] Swelling   Edema    Tape Other (See Comments)   Has to have no stick tape.   Zestril  [lisinopril ] Cough   Hytrin  [terazosin ] Hives, Nausea Only   Imdur  [isosorbide  Nitrate] Nausea Only   Sulfonamide Derivatives Nausea And Vomiting, Rash        Medication List     TAKE these medications    atorvastatin  20 MG tablet Commonly known as: LIPITOR TAKE 1/2 TABLET BY MOUTH DAILY   Azelastine  HCl 137 MCG/SPRAY Soln PLACE 1 SPRAY INTO BOTH NOSTRILS 2 (TWO) TIMES DAILY. FOR RUNNY NOSE   bisoprolol  5 MG tablet Commonly known as: ZEBETA  TAKE 1 TABLET (5 MG TOTAL) BY MOUTH DAILY.   cloNIDine  0.1 MG tablet Commonly known as: CATAPRES  Take 1 tablet (0.1 mg total) by mouth as directed. Take 1 tablet (0.1 mg total) by mouth daily as needed for systolic blood pressure greater than 180.   feeding supplement Liqd Take 237 mLs by mouth 2 (two) times daily between meals. What changed:  when to take this reasons to take this   ferrous sulfate  325 (65 FE) MG tablet Take 1 tablet (325 mg total) by mouth daily with breakfast.   flecainide  50 MG tablet Commonly known as: TAMBOCOR  TAKE 1 TABLET BY MOUTH TWICE A DAY   hydrALAZINE  50 MG tablet Commonly known as: APRESOLINE  TAKE 1 TABLET BY MOUTH TWICE A DAY   levothyroxine  75 MCG tablet Commonly known as: SYNTHROID  TAKE 1 TABLET BY MOUTH EVERY DAY   mirtazapine  7.5 MG tablet Commonly known as: REMERON  TAKE 1 TABLET BY MOUTH EVERYDAY AT BEDTIME   Multivitamin Women 50+ Tabs Take 1 tablet by mouth at bedtime.   pantoprazole  40 MG  tablet Commonly known as: PROTONIX  TAKE 1 TABLET BY MOUTH EVERY DAY   predniSONE  10 MG tablet Commonly known as: DELTASONE  Take 5 tablets (50 mg total) by mouth daily with breakfast. And decrease by one tablet daily Start taking on: July 04, 2024   torsemide  20 MG tablet Commonly known as: DEMADEX  TAKE 1 TABLET BY MOUTH TWICE A DAY What changed:  how much to take when to take this   Trelegy Ellipta  100-62.5-25 MCG/ACT Aepb Generic drug: Fluticasone -Umeclidin-Vilant Inhale 1 puff into the lungs daily.   VITAMIN D -3 PO Take 1 capsule by mouth daily.        Discharge Exam: Filed Weights   07/01/24 0500 07/02/24 0500 07/03/24 0500  Weight: 49.1 kg 49.5 kg 49.1 kg   HEENT:  [place]/AT, No thrush, no icterus CV:  RRR, no rub, no S3, no S4 Lung:  bibasilar rales. No wheeze Abd:  soft/+BS, NT Ext:  No edema, no lymphangitis, no synovitis, no rash   Condition at discharge: stable  The results of significant diagnostics from this hospitalization (including imaging, microbiology, ancillary and laboratory) are listed below for reference.   Imaging Studies: US  THORACENTESIS ASP PLEURAL SPACE W/IMG GUIDE Result Date:  07/01/2024 INDICATION: Patient history of congestive heart failure, chronic right pleural effusion previously managed with medications who is currently admitted for dyspnea with concern for pneumonia. Request for diagnostic and therapeutic right thoracentesis. EXAM: ULTRASOUND GUIDED RIGHT THORACENTESIS MEDICATIONS: 4 mL 1% lidocaine  COMPLICATIONS: None immediate. PROCEDURE: An ultrasound guided thoracentesis was thoroughly discussed with the patient and questions answered. The benefits, risks, alternatives and complications were also discussed. The patient understands and wishes to proceed with the procedure. Written consent was obtained. Ultrasound was performed to localize and mark an adequate pocket of fluid in the right chest. The area was then prepped and draped in  the normal sterile fashion. 1% Lidocaine  was used for local anesthesia. Under ultrasound guidance a 19 gauge, 7-cm, Yueh catheter was introduced. Thoracentesis was performed. The catheter was removed and a dressing applied. FINDINGS: A total of approximately 400 mL of serosanguineous fluid was removed. Samples were sent to the laboratory as requested by the clinical team. IMPRESSION: Successful ultrasound guided right thoracentesis yielding 400 mL of pleural fluid. Performed by Clotilda Hesselbach, PA-C Electronically Signed   By: Ester Sides M.D.   On: 07/01/2024 15:29   DG Chest 1 View Result Date: 07/01/2024 CLINICAL DATA:  Status post right thoracentesis. EXAM: CHEST  1 VIEW COMPARISON:  06/29/2024 FINDINGS: Stable cardiac enlargement. Reduced right pleural volume after thoracentesis. No pneumothorax. Slightly diminished pulmonary interstitial prominence likely related to decreased pulmonary interstitial edema. The visualized skeletal structures are unremarkable. IMPRESSION: Reduced right pleural volume after thoracentesis. No pneumothorax. Decrease in pulmonary interstitial edema. Electronically Signed   By: Marcey Moan M.D.   On: 07/01/2024 11:09   CT CHEST WO CONTRAST Result Date: 06/29/2024 EXAM: CT CHEST WITHOUT CONTRAST 06/29/2024 06:29:51 PM TECHNIQUE: CT of the chest was performed without the administration of intravenous contrast. Multiplanar reformatted images are provided for review. Automated exposure control, iterative reconstruction, and/or weight based adjustment of the mA/kV was utilized to reduce the radiation dose to as low as reasonably achievable. COMPARISON: Radiograph earlier today and CT chest dated 12/23/2023. CLINICAL HISTORY: Respiratory illness, nondiagnostic xray. FINDINGS: MEDIASTINUM: Mild cardiomegaly. Mild coronary atherosclerosis of the LAD. Thoracic aortic atherosclerosis. Enlargement of the right main pulmonary artery, suggesting pulmonary arterial hypertension.  LYMPH NODES: Prominent mediastinal nodes, including a dominant 13 mm short axis right tracheal node (emphysema 2), unchanged, likely reactive. No hilar or axillary lymphadenopathy. LUNGS AND PLEURA: Multifocal patchy/nodular opacities with surrounding ground glass in the lungs bilaterally, including a dominant 2.2 cm subpleural patchy opacity in the posterior left lower lobe. This appearance is progressive from the prior, favoring multifocal infection/pneumonia. Mild patchy right lower lobe atelectasis. Interlobuar septal thickening in the lung apices, raising the possibility of superimposed mild interstitial edema. Moderate right and trace left pleural effusions, minimally progressive. No pneumothorax. SOFT TISSUES/BONES: No acute abnormality of the bones or soft tissues. UPPER ABDOMEN: Limited images of the upper abdomen demonstrates no acute abnormality. IMPRESSION: 1. Multifocal pneumonia, new/progressive. 2. Cardiomegaly with suspected mild superimposed interstitial edema. Moderate right and trace left pleural effusions, minimally progressive. 3. Suspected pulmonary arterial hypertension. Electronically signed by: Pinkie Pebbles MD 06/29/2024 08:13 PM EST RP Workstation: [id number]   DG Chest Portable 1 View Result Date: 06/29/2024 CLINICAL DATA:  Shortness of breath past several days. EXAM: PORTABLE CHEST 1 VIEW COMPARISON:  01/14/2024 FINDINGS: Lungs are adequately inflated demonstrate background of chronic interstitial lung disease. Slight worsening opacification of the right base with small right effusion. Possible mild soup 0 spatula airspace density over the right mid to  upper lung. Findings concerning for acute infection superimposed on background of interstitial lung disease. Cardiomediastinal silhouette and remainder of the exam is unchanged. IMPRESSION: Slight worsening opacification of the right base with small right effusion. Possible mild airspace density over the right mid to upper lung.  Findings concerning for acute infection superimposed on background of interstitial lung disease. Electronically Signed   By: Toribio Agreste M.D.   On: 06/29/2024 13:17    Microbiology: Results for orders placed or performed during the hospital encounter of 06/29/24  Resp panel by RT-PCR (RSV, Flu A&B, Covid) Anterior Nasal Swab     Status: None   Collection Time: 06/29/24 11:47 AM   Specimen: Anterior Nasal Swab  Result Value Ref Range Status   SARS Coronavirus 2 by RT PCR NEGATIVE NEGATIVE Final    Comment: (NOTE) SARS-CoV-2 target nucleic acids are NOT DETECTED.  The SARS-CoV-2 RNA is generally detectable in upper respiratory specimens during the acute phase of infection. The lowest concentration of SARS-CoV-2 viral copies this assay can detect is 138 copies/mL. A negative result does not preclude SARS-Cov-2 infection and should not be used as the sole basis for treatment or other patient management decisions. A negative result may occur with  improper specimen collection/handling, submission of specimen other than nasopharyngeal swab, presence of viral mutation(s) within the areas targeted by this assay, and inadequate number of viral copies(<138 copies/mL). A negative result must be combined with clinical observations, patient history, and epidemiological information. The expected result is Negative.  Fact Sheet for Patients:  bloggercourse.com  Fact Sheet for Healthcare Providers:  seriousbroker.it  This test is no t yet approved or cleared by the [place]  FDA and  has been authorized for detection and/or diagnosis of SARS-CoV-2 by FDA under an Emergency Use Authorization (EUA). This EUA will remain  in effect (meaning this test can be used) for the duration of the COVID-19 declaration under Section 564(b)(1) of the Act, 21 U.S.C.section 360bbb-3(b)(1), unless the authorization is terminated  or revoked sooner.        Influenza A by PCR NEGATIVE NEGATIVE Final   Influenza B by PCR NEGATIVE NEGATIVE Final    Comment: (NOTE) The Xpert Xpress SARS-CoV-2/FLU/RSV plus assay is intended as an aid in the diagnosis of influenza from Nasopharyngeal swab specimens and should not be used as a sole basis for treatment. Nasal washings and aspirates are unacceptable for Xpert Xpress SARS-CoV-2/FLU/RSV testing.  Fact Sheet for Patients: bloggercourse.com  Fact Sheet for Healthcare Providers: seriousbroker.it  This test is not yet approved or cleared by the [place]  FDA and has been authorized for detection and/or diagnosis of SARS-CoV-2 by FDA under an Emergency Use Authorization (EUA). This EUA will remain in effect (meaning this test can be used) for the duration of the COVID-19 declaration under Section 564(b)(1) of the Act, 21 U.S.C. section 360bbb-3(b)(1), unless the authorization is terminated or revoked.     Resp Syncytial Virus by PCR NEGATIVE NEGATIVE Final    Comment: (NOTE) Fact Sheet for Patients: bloggercourse.com  Fact Sheet for Healthcare Providers: seriousbroker.it  This test is not yet approved or cleared by the [place]  FDA and has been authorized for detection and/or diagnosis of SARS-CoV-2 by FDA under an Emergency Use Authorization (EUA). This EUA will remain in effect (meaning this test can be used) for the duration of the COVID-19 declaration under Section 564(b)(1) of the Act, 21 U.S.C. section 360bbb-3(b)(1), unless the authorization is terminated or revoked.  Performed at [facility], [phone]  [address], [place], [place] [address]   Culture, blood (routine x 2)     Status: None (Preliminary result)   Collection Time: 06/29/24 12:15 PM   Specimen: BLOOD  Result Value Ref Range Status   Specimen Description BLOOD LEFT ANTECUBITAL  Final   Special Requests   Final    Blood  Culture adequate volume BOTTLES DRAWN AEROBIC AND ANAEROBIC   Culture   Final    NO GROWTH 4 DAYS Performed at [facility], [address] [place], [place] [address]    Report Status PENDING  Incomplete  Culture, blood (routine x 2)     Status: None (Preliminary result)   Collection Time: 06/29/24 12:15 PM   Specimen: BLOOD  Result Value Ref Range Status   Specimen Description BLOOD RIGHT ASSIST CONTROL  Final   Special Requests   Final    BOTTLES DRAWN AEROBIC AND ANAEROBIC Blood Culture adequate volume   Culture   Final    NO GROWTH 4 DAYS Performed at [facility], [address] [place], [place] [address]    Report Status PENDING  Incomplete  MRSA Next Gen by PCR, Nasal     Status: Abnormal   Collection Time: 06/29/24  5:20 PM   Specimen: Nasal Mucosa; Nasal Swab  Result Value Ref Range Status   MRSA by PCR Next Gen DETECTED (A) NOT DETECTED Final    Comment: RESULT CALLED TO, READ BACK BY AND VERIFIED WITH: KATIE THOMAS @ 1921 ON 06/29/24 C VARNER (NOTE) The GeneXpert MRSA Assay (FDA approved for NASAL specimens only), is one component of a comprehensive MRSA colonization surveillance program. It is not intended to diagnose MRSA infection nor to guide or monitor treatment for MRSA infections. Test performance is not FDA approved in patients less than 43 years old. Performed at [facility], [address], [place], [place] [address]   Respiratory (~20 pathogens) panel by PCR     Status: None   Collection Time: 06/29/24  5:52 PM   Specimen: Nasopharyngeal Swab; Respiratory  Result Value Ref Range Status   Adenovirus NOT DETECTED NOT DETECTED Final   Coronavirus 229E NOT DETECTED NOT DETECTED Final    Comment: (NOTE) The Coronavirus on the Respiratory Panel, DOES NOT test for the novel  Coronavirus (2019 nCoV)    Coronavirus HKU1 NOT DETECTED NOT DETECTED Final   Coronavirus NL63 NOT DETECTED NOT DETECTED Final   Coronavirus OC43 NOT DETECTED NOT DETECTED Final    Metapneumovirus NOT DETECTED NOT DETECTED Final   Rhinovirus / Enterovirus NOT DETECTED NOT DETECTED Final   Influenza A NOT DETECTED NOT DETECTED Final   Influenza B NOT DETECTED NOT DETECTED Final   Parainfluenza Virus 1 NOT DETECTED NOT DETECTED Final   Parainfluenza Virus 2 NOT DETECTED NOT DETECTED Final   Parainfluenza Virus 3 NOT DETECTED NOT DETECTED Final   Parainfluenza Virus 4 NOT DETECTED NOT DETECTED Final   Respiratory Syncytial Virus NOT DETECTED NOT DETECTED Final   Bordetella pertussis NOT DETECTED NOT DETECTED Final   Bordetella Parapertussis NOT DETECTED NOT DETECTED Final   Chlamydophila pneumoniae NOT DETECTED NOT DETECTED Final   Mycoplasma pneumoniae NOT DETECTED NOT DETECTED Final    Comment: Performed at [facility], [address], [place], [place] [address]  Culture, body fluid w Gram Stain-bottle     Status: None (Preliminary result)   Collection Time: 07/01/24 10:20 AM   Specimen: Pleura  Result Value Ref Range Status   Specimen Description PLEURAL  Final  Special Requests 10CC BOTTLES DRAWN AEROBIC AND ANAEROBIC  Final   Culture   Final    NO GROWTH 2 DAYS Performed at [facility], [address], [place], [place] [address]    Report Status PENDING  Incomplete  Gram stain     Status: None   Collection Time: 07/01/24 10:20 AM   Specimen: Pleura  Result Value Ref Range Status   Specimen Description PLEURAL  Final   Special Requests PLEURAL  Final   Gram Stain   Final    WBC PRESENT, PREDOMINANTLY MONONUCLEAR NO ORGANISMS SEEN CYTOSPIN SMEAR Performed at [facility], [address], [place], [place] [address]    Report Status 07/01/2024 FINAL  Final   *Note: Due to a large number of results and/or encounters for the requested time period, some results have not been displayed. A complete set of results can be found in Results Review.    Labs: CBC: Recent Labs  Lab 06/29/24 1146 06/30/24 0429 07/01/24 0405 07/03/24 0401  WBC 7.8 8.5  13.6* 11.3*  HGB 9.0* 9.1* 8.6* 8.0*  HCT 29.2* 29.4* 27.6* 26.1*  MCV 87.7 88.0 87.9 87.3  PLT 109* 110* 113* 109*   Basic Metabolic Panel: Recent Labs  Lab 06/29/24 1146 06/30/24 0429 07/01/24 0405 07/02/24 0438 07/03/24 0401  NA 137 138 138 138 139  K 4.1 3.9 4.5 4.6 4.4  CL 91* 93* 92* 95* 95*  CO2 37* 37* 35* 36* 36*  GLUCOSE 125* 167* 169* 162* 105*  BUN 36* 41* 53* 60* 58*  CREATININE 1.49* 1.72* 1.68* 1.91* 2.07*  CALCIUM  9.0 8.3* 8.0* 7.6* 7.4*  MG  --  2.3 2.3 2.4 2.3   Liver Function Tests: Recent Labs  Lab 06/29/24 1146 07/01/24 0405  AST 21 21  ALT 10 12  ALKPHOS 61 53  BILITOT 0.8 0.3  PROT 7.7 6.9  ALBUMIN  4.1 3.8   CBG: No results for input(s): GLUCAP in the last 168 hours.  Discharge time spent: greater than 30 minutes.  Signed: Alm Schneider, MD [facility] 07/03/2024

## 2024-07-04 ENCOUNTER — Telehealth: Payer: Self-pay

## 2024-07-04 ENCOUNTER — Other Ambulatory Visit: Payer: Self-pay | Admitting: *Deleted

## 2024-07-04 DIAGNOSIS — E034 Atrophy of thyroid (acquired): Secondary | ICD-10-CM

## 2024-07-04 LAB — CULTURE, BLOOD (ROUTINE X 2)
Culture: NO GROWTH
Culture: NO GROWTH
Special Requests: ADEQUATE
Special Requests: ADEQUATE

## 2024-07-04 NOTE — Transitions of Care (Post Inpatient/ED Visit) (Signed)
   07/04/2024  Name: [name] MRN: [id number] DOB: 09-15-1933  Today's TOC FU Call Status: Today's TOC FU Call Status:: Successful TOC FU Call Completed TOC FU Call Complete Date: 07/04/24  Patient's Name and Date of Birth confirmed. Name, DOB  Transition Care Management Follow-up Telephone Call Date of Discharge: 07/03/24 [facility] Type of Discharge: Inpatient Admission Primary Inpatient Discharge Diagnosis:: Acute on chronic respiratory failure with hypoxia and hypercapnia How have you been since you were released from the hospital?: Better (I am feeling better.) Any questions or concerns?: No  Items Reviewed: Did you receive and understand the discharge instructions provided?: Yes Medications obtained,verified, and reconciled?: Yes (Medications Reviewed) (Verbally states that she has all her medications.  Denies review)  Medications Reviewed Today: verbally states she has all her medications but declines review. Medications Reviewed Today   Medications were not reviewed in this encounter     Placed call to patient and reviewed reason for call.  Patient reports that she has reviewed all her instructions, medications and has her follow ups planned. Declined TOC call.  Provided my contact information for patient if she needs to call me back.  Alan Ee, RN, BSN, CEN [employer]- Transition of Care Team.  [facility] [phone]

## 2024-07-05 LAB — CULTURE, BODY FLUID W GRAM STAIN -BOTTLE: Gram Stain: NO GROWTH

## 2024-07-06 ENCOUNTER — Inpatient Hospital Stay

## 2024-07-06 ENCOUNTER — Other Ambulatory Visit: Payer: Self-pay | Admitting: Family Medicine

## 2024-07-06 ENCOUNTER — Ambulatory Visit: Payer: Self-pay | Admitting: Physician Assistant

## 2024-07-06 VITALS — BP 172/51 | HR 61 | Temp 96.7°F | Resp 18

## 2024-07-06 DIAGNOSIS — D649 Anemia, unspecified: Secondary | ICD-10-CM

## 2024-07-06 DIAGNOSIS — D631 Anemia in chronic kidney disease: Secondary | ICD-10-CM | POA: Diagnosis not present

## 2024-07-06 DIAGNOSIS — N184 Chronic kidney disease, stage 4 (severe): Secondary | ICD-10-CM | POA: Diagnosis not present

## 2024-07-06 DIAGNOSIS — M8580 Other specified disorders of bone density and structure, unspecified site: Secondary | ICD-10-CM

## 2024-07-06 LAB — CBC
HCT: 30.4 % — ABNORMAL LOW (ref 36.0–46.0)
Hemoglobin: 9.2 g/dL — ABNORMAL LOW (ref 12.0–15.0)
MCH: 27 pg (ref 26.0–34.0)
MCHC: 30.3 g/dL (ref 30.0–36.0)
MCV: 89.1 fL (ref 80.0–100.0)
Platelets: 139 K/uL — ABNORMAL LOW (ref 150–400)
RBC: 3.41 MIL/uL — ABNORMAL LOW (ref 3.87–5.11)
RDW: 17.2 % — ABNORMAL HIGH (ref 11.5–15.5)
WBC: 12.6 K/uL — ABNORMAL HIGH (ref 4.0–10.5)
nRBC: 0 % (ref 0.0–0.2)

## 2024-07-06 LAB — SAMPLE TO BLOOD BANK

## 2024-07-06 LAB — CYTOLOGY - NON PAP

## 2024-07-06 MED ORDER — EPOETIN ALFA-EPBX 40000 UNIT/ML IJ SOLN
40000.0000 [IU] | Freq: Once | INTRAMUSCULAR | Status: AC
  Administered 2024-07-06: 40000 [IU] via SUBCUTANEOUS
  Filled 2024-07-06: qty 1

## 2024-07-06 NOTE — Progress Notes (Signed)
 Patient's Hgb 9.2 and blood pressure stable. Patient  tolerated Retacrit  injection with no complaints voiced.  Site clean and dry with no bruising or swelling noted at site.  See MAR for details.  Band aid applied.  Patient stable during and after injection.  Vss with discharge and left in satisfactory condition with no s/s of distress noted. All follow ups as scheduled.   [name]

## 2024-07-07 DIAGNOSIS — N2581 Secondary hyperparathyroidism of renal origin: Secondary | ICD-10-CM | POA: Diagnosis not present

## 2024-07-07 DIAGNOSIS — I129 Hypertensive chronic kidney disease with stage 1 through stage 4 chronic kidney disease, or unspecified chronic kidney disease: Secondary | ICD-10-CM | POA: Diagnosis not present

## 2024-07-07 DIAGNOSIS — D638 Anemia in other chronic diseases classified elsewhere: Secondary | ICD-10-CM | POA: Diagnosis not present

## 2024-07-07 DIAGNOSIS — N1832 Chronic kidney disease, stage 3b: Secondary | ICD-10-CM | POA: Diagnosis not present

## 2024-07-15 ENCOUNTER — Other Ambulatory Visit: Payer: Self-pay | Admitting: Family Medicine

## 2024-07-17 ENCOUNTER — Other Ambulatory Visit: Payer: Self-pay | Admitting: Family Medicine

## 2024-07-17 DIAGNOSIS — J3489 Other specified disorders of nose and nasal sinuses: Secondary | ICD-10-CM

## 2024-07-19 ENCOUNTER — Other Ambulatory Visit: Payer: Self-pay | Admitting: Gastroenterology

## 2024-07-19 DIAGNOSIS — R630 Anorexia: Secondary | ICD-10-CM

## 2024-07-20 ENCOUNTER — Inpatient Hospital Stay

## 2024-07-20 ENCOUNTER — Ambulatory Visit: Payer: Self-pay | Admitting: *Deleted

## 2024-07-20 ENCOUNTER — Emergency Department (HOSPITAL_COMMUNITY)

## 2024-07-20 ENCOUNTER — Telehealth: Payer: Self-pay

## 2024-07-20 ENCOUNTER — Encounter (HOSPITAL_COMMUNITY): Payer: Self-pay

## 2024-07-20 ENCOUNTER — Ambulatory Visit: Admitting: Family Medicine

## 2024-07-20 ENCOUNTER — Other Ambulatory Visit: Payer: Self-pay

## 2024-07-20 ENCOUNTER — Inpatient Hospital Stay: Attending: Oncology

## 2024-07-20 ENCOUNTER — Emergency Department (HOSPITAL_COMMUNITY)
Admission: EM | Admit: 2024-07-20 | Discharge: 2024-07-20 | Disposition: A | Discharge status: Home / Self Care | Attending: Emergency Medicine | Admitting: Emergency Medicine

## 2024-07-20 VITALS — BP 170/53 | HR 67 | Temp 98.2°F | Resp 16

## 2024-07-20 DIAGNOSIS — D649 Anemia, unspecified: Secondary | ICD-10-CM

## 2024-07-20 DIAGNOSIS — I517 Cardiomegaly: Secondary | ICD-10-CM | POA: Diagnosis not present

## 2024-07-20 DIAGNOSIS — D631 Anemia in chronic kidney disease: Secondary | ICD-10-CM | POA: Insufficient documentation

## 2024-07-20 DIAGNOSIS — J90 Pleural effusion, not elsewhere classified: Secondary | ICD-10-CM | POA: Diagnosis not present

## 2024-07-20 DIAGNOSIS — Z79899 Other long term (current) drug therapy: Secondary | ICD-10-CM | POA: Insufficient documentation

## 2024-07-20 DIAGNOSIS — R918 Other nonspecific abnormal finding of lung field: Secondary | ICD-10-CM | POA: Diagnosis not present

## 2024-07-20 DIAGNOSIS — M8580 Other specified disorders of bone density and structure, unspecified site: Secondary | ICD-10-CM

## 2024-07-20 DIAGNOSIS — I509 Heart failure, unspecified: Secondary | ICD-10-CM | POA: Diagnosis not present

## 2024-07-20 DIAGNOSIS — I110 Hypertensive heart disease with heart failure: Secondary | ICD-10-CM | POA: Diagnosis not present

## 2024-07-20 DIAGNOSIS — I5020 Unspecified systolic (congestive) heart failure: Secondary | ICD-10-CM

## 2024-07-20 DIAGNOSIS — N184 Chronic kidney disease, stage 4 (severe): Secondary | ICD-10-CM | POA: Insufficient documentation

## 2024-07-20 DIAGNOSIS — R0602 Shortness of breath: Secondary | ICD-10-CM | POA: Diagnosis not present

## 2024-07-20 LAB — CBC WITH DIFFERENTIAL/PLATELET
Abs Immature Granulocytes: 0 K/uL (ref 0.00–0.07)
Basophils Absolute: 0 K/uL (ref 0.0–0.1)
Basophils Relative: 0 %
Eosinophils Absolute: 0 K/uL (ref 0.0–0.5)
Eosinophils Relative: 0 %
HCT: 30.9 % — ABNORMAL LOW (ref 36.0–46.0)
Hemoglobin: 9.5 g/dL — ABNORMAL LOW (ref 12.0–15.0)
Immature Granulocytes: 0 %
Lymphocytes Relative: 12 %
Lymphs Abs: 0.4 K/uL — ABNORMAL LOW (ref 0.7–4.0)
MCH: 27.3 pg (ref 26.0–34.0)
MCHC: 30.7 g/dL (ref 30.0–36.0)
MCV: 88.8 fL (ref 80.0–100.0)
Monocytes Absolute: 0.3 K/uL (ref 0.1–1.0)
Monocytes Relative: 8 %
Neutro Abs: 2.9 K/uL (ref 1.7–7.7)
Neutrophils Relative %: 80 %
Platelets: 97 K/uL — ABNORMAL LOW (ref 150–400)
RBC: 3.48 MIL/uL — ABNORMAL LOW (ref 3.87–5.11)
RDW: 17.8 % — ABNORMAL HIGH (ref 11.5–15.5)
WBC: 3.6 K/uL — ABNORMAL LOW (ref 4.0–10.5)
nRBC: 0 % (ref 0.0–0.2)

## 2024-07-20 LAB — COMPREHENSIVE METABOLIC PANEL WITH GFR
ALT: 12 U/L (ref 0–44)
AST: 25 U/L (ref 15–41)
Albumin: 4.2 g/dL (ref 3.5–5.0)
Alkaline Phosphatase: 53 U/L (ref 38–126)
Anion gap: 13 (ref 5–15)
BUN: 32 mg/dL — ABNORMAL HIGH (ref 8–23)
CO2: 32 mmol/L (ref 22–32)
Calcium: 9.4 mg/dL (ref 8.9–10.3)
Chloride: 93 mmol/L — ABNORMAL LOW (ref 98–111)
Creatinine, Ser: 1.61 mg/dL — ABNORMAL HIGH (ref 0.44–1.00)
GFR, Estimated: 30 mL/min — ABNORMAL LOW (ref >60)
Glucose, Bld: 128 mg/dL — ABNORMAL HIGH (ref 70–99)
Potassium: 4.1 mmol/L (ref 3.5–5.1)
Sodium: 138 mmol/L (ref 135–145)
Total Bilirubin: 0.6 mg/dL (ref 0.0–1.2)
Total Protein: 7.4 g/dL (ref 6.5–8.1)

## 2024-07-20 LAB — SAMPLE TO BLOOD BANK

## 2024-07-20 LAB — PRO BRAIN NATRIURETIC PEPTIDE: Pro Brain Natriuretic Peptide: 5883 pg/mL — ABNORMAL HIGH (ref <300.0)

## 2024-07-20 LAB — TROPONIN T, HIGH SENSITIVITY
Troponin T High Sensitivity: 34 ng/L — ABNORMAL HIGH (ref 0–19)
Troponin T High Sensitivity: 33 ng/L — ABNORMAL HIGH (ref 0–19)

## 2024-07-20 LAB — CBC
HCT: 30.5 % — ABNORMAL LOW (ref 36.0–46.0)
Hemoglobin: 9.4 g/dL — ABNORMAL LOW (ref 12.0–15.0)
MCH: 27.6 pg (ref 26.0–34.0)
MCHC: 30.8 g/dL (ref 30.0–36.0)
MCV: 89.4 fL (ref 80.0–100.0)
Platelets: 77 K/uL — ABNORMAL LOW (ref 150–400)
RBC: 3.41 MIL/uL — ABNORMAL LOW (ref 3.87–5.11)
RDW: 17.6 % — ABNORMAL HIGH (ref 11.5–15.5)
WBC: 3 K/uL — ABNORMAL LOW (ref 4.0–10.5)
nRBC: 0 % (ref 0.0–0.2)

## 2024-07-20 MED ORDER — EPOETIN ALFA-EPBX 40000 UNIT/ML IJ SOLN
40000.0000 [IU] | Freq: Once | INTRAMUSCULAR | Status: AC
  Administered 2024-07-20: 40000 [IU] via SUBCUTANEOUS
  Filled 2024-07-20: qty 1

## 2024-07-20 NOTE — Discharge Instructions (Signed)
 Take an extra fluid pill tomorrow.  And if you need to you can take another 1 the next day.  Follow-up with your doctor next week

## 2024-07-20 NOTE — Telephone Encounter (Signed)
 [name] called to advise that they were on the way to emergency department as they were advised by who they thought was Luke there is nothing that the doctor here can do for her she needs to go straight to the emergency room. [name] stated that [facility] did not feel that she needed go to the hospital she just wanted Rosaline Bruns to listen to her lungs to see what she thought. I assured [name] that it was not me Luke that she had talked to because I was on lunch at 1:10pm and there was no documentation about the encounter she was speaking of. [name] stated either way we are on the way to the hospital now because someone told us  to go. I told [name] that I would follow up with her this afternoon or in the morning to check on Jaden.

## 2024-07-20 NOTE — Telephone Encounter (Signed)
 Reached out to advise emergency department, no answer on home phone, left message. Tried to call daughters Macario and Osgood, no answer, left voicemail's advising to call back.

## 2024-07-20 NOTE — Telephone Encounter (Signed)
 Patients daughter called back after lunch. Gave daughter nurse Kim's advise. Daughter asked if she could just bring patient to PCP office instead of going to ER. Explained to daughter that she could but that if she has SOB and fluid in her lungs then we would just send patient to ER which is where nurse is advising that she go. Daughter was not happy but voiced understanding.

## 2024-07-20 NOTE — Telephone Encounter (Signed)
  FYI Only or Action Required?: FYI only for provider: appointment scheduled on 07/20/24.  Patient was last seen in primary care on 06/29/2024 by Severa Rock HERO, FNP.  Called Nurse Triage reporting Shortness of Breath.  Symptoms began yesterday.  Interventions attempted: Other: wearing oxygen .  Symptoms are: gradually worsening.  Triage Disposition: See HCP Within 4 Hours (Or PCP Triage)  Patient/caregiver understands and will follow disposition?: Yes                Copied from CRM #[id number]. Topic: Clinical - Red Word Triage >> Jul 20, 2024  8:17 AM Cherylann RAMAN wrote: Red Word that prompted transfer to Nurse Triage: Patient called in with complaints of possible fluid buildup in her lungs. She states that it is harder for her to breath and she is short of breath. Reason for Disposition  [1] MILD difficulty breathing (e.g., minimal/no SOB at rest, SOB with walking, pulse < 100) AND [2] NEW-onset or WORSE than normal  Answer Assessment - Initial Assessment Questions Appt today with DOD. Recommended if sx worsening go to ED. O2 sat at 91% on O2 at 2L/min [place] now .      SABRA RESPIRATORY STATUS: Describe your breathing? (e.g., wheezing, shortness of breath, unable to speak, severe coughing)      Shortness breath 2. ONSET: When did this breathing problem begin?      Last night  3. PATTERN Does the difficult breathing come and go, or has it been constant since it started?      Comes and goes with exertion 4. SEVERITY: How bad is your breathing? (e.g., mild, moderate, severe)      Moderate  5. RECURRENT SYMPTOM: Have you had difficulty breathing before? If Yes, ask: When was the last time? and What happened that time?      Yes hx pneumonia recently , fluid on lungs 6. CARDIAC HISTORY: Do you have any history of heart disease? (e.g., heart attack, angina, bypass surgery, angioplasty)      See hx  7. LUNG HISTORY: Do you have any history of lung disease?  (e.g.,  pulmonary embolus, asthma, emphysema)     Hx pneumonia  8. CAUSE: What do you think is causing the breathing problem?      More fluid build up in  lungs 9. OTHER SYMPTOMS: Do you have any other symptoms? (e.g., chest pain, cough, dizziness, fever, runny nose)     No chest pain , no dizziness. Shortness of breath comes and goes  noted more with exertion.  10. O2 SATURATION MONITOR:  Do you use an oxygen  saturation monitor (pulse oximeter) at home? If Yes, ask: What is your reading (oxygen  level) today? What is your usual oxygen  saturation reading? (e.g., 95%)       91% on O2 at 2 L/ min 11. PREGNANCY: Is there any chance you are pregnant? When was your last menstrual period?       na 12. TRAVEL: Have you traveled out of the country in the last month? (e.g., travel history, exposures)       na  Protocols used: Breathing Difficulty-A-AH

## 2024-07-20 NOTE — Telephone Encounter (Signed)
Noted. Informed pt.  

## 2024-07-20 NOTE — ED Triage Notes (Signed)
 Pt arrived via Pov after being advised to seek further evaluation in the ER by her PCP office due to increased SOB since yesterday. Pt reports concern for possible fluid in her lungs. Pt does present with wheezing on the left side of her chest.

## 2024-07-20 NOTE — Progress Notes (Signed)
 Patient's Hgb 9.4 and blood pressure stable. Patient  tolerated Retacrit  injection with no complaints voiced.  Site clean and dry with no bruising or swelling noted at site.  See MAR for details.  Band aid applied.  Patient stable during and after injection.  Vss with discharge and left in satisfactory condition with no s/s of distress noted. All follow ups as scheduled.   Zaia Carre

## 2024-07-22 ENCOUNTER — Ambulatory Visit (HOSPITAL_BASED_OUTPATIENT_CLINIC_OR_DEPARTMENT_OTHER): Admitting: Pulmonary Disease

## 2024-07-22 NOTE — Telephone Encounter (Signed)
 [name], see Ladonna's message. Patient did not want ov here stating she does not take any medications we prescribed. [name] had been filling mirtazpine at bedtime for appetite. She can get from her PCP if she prefers to not follow up here.

## 2024-07-22 NOTE — ED Provider Notes (Signed)
 [place] EMERGENCY DEPARTMENT AT [facility] Provider Note   CSN: [id number] Arrival date & time: 07/20/24  1501     Patient presents with: Shortness of Breath   [name] is a 89 y.o. female.   Patient complains of mild shortness of breath.  No fever no chills no cough.  Patient has a history of heart failure  The history is provided by the patient and medical records. No language interpreter was used.  Shortness of Breath Severity:  Mild Onset quality:  Gradual Timing:  Intermittent Progression:  Waxing and waning Chronicity:  Recurrent Context: activity   Relieved by:  Nothing Worsened by:  Nothing Ineffective treatments:  None tried Associated symptoms: no abdominal pain, no chest pain, no cough, no headaches and no rash        Prior to Admission medications   Medication Sig Start Date End Date Taking? Authorizing Provider  atorvastatin  (LIPITOR) 20 MG tablet TAKE 1/2 TABLET BY MOUTH DAILY 05/27/24   Gottschalk, Ashly M, DO  Azelastine  HCl 137 MCG/SPRAY SOLN PLACE 1 SPRAY INTO BOTH NOSTRILS 2 (TWO) TIMES DAILY. FOR RUNNY NOSE 07/18/24   Jolinda Potter M, DO  bisoprolol  (ZEBETA ) 5 MG tablet TAKE 1 TABLET (5 MG TOTAL) BY MOUTH DAILY. 05/06/24   Jolinda Potter HERO, DO  Cholecalciferol  (VITAMIN D -3 PO) Take 1 capsule by mouth daily.    [provider]  cloNIDine  (CATAPRES ) 0.1 MG tablet Take 1 tablet (0.1 mg total) by mouth as directed. Take 1 tablet (0.1 mg total) by mouth daily as needed for systolic blood pressure greater than 180. 01/01/24   Lavona Agent, MD  feeding supplement (ENSURE ENLIVE / ENSURE PLUS) LIQD Take 237 mLs by mouth 2 (two) times daily between meals. Patient taking differently: Take 237 mLs by mouth daily as needed (decreased appetite). 08/28/22   Evonnie Lenis, MD  ferrous sulfate  325 (65 FE) MG tablet Take 1 tablet (325 mg total) by mouth daily with breakfast. 07/03/24   Tat, Lenis, MD  flecainide  (TAMBOCOR ) 50 MG tablet TAKE  1 TABLET BY MOUTH TWICE A DAY 05/19/24   Lavona Agent, MD  Fluticasone -Umeclidin-Vilant (TRELEGY ELLIPTA ) 100-62.5-25 MCG/ACT AEPB Inhale 1 puff into the lungs daily. 02/18/24   Jude Harden GAILS, MD  hydrALAZINE  (APRESOLINE ) 50 MG tablet TAKE 1 TABLET BY MOUTH TWICE A DAY 07/18/24   Jolinda Potter M, DO  levothyroxine  (SYNTHROID ) 75 MCG tablet TAKE 1 TABLET BY MOUTH EVERY DAY 07/04/24   Jolinda Potter M, DO  mirtazapine  (REMERON ) 7.5 MG tablet TAKE 1 TABLET BY MOUTH EVERYDAY AT BEDTIME 07/19/24   Lewis, Leslie S, PA-C  Multiple Vitamins-Minerals (MULTIVITAMIN WOMEN 50+) TABS Take 1 tablet by mouth at bedtime.    [provider]  pantoprazole  (PROTONIX ) 40 MG tablet TAKE 1 TABLET BY MOUTH EVERY DAY 06/23/24   Shirlean Therisa ORN, NP  predniSONE  (DELTASONE ) 10 MG tablet Take 5 tablets (50 mg total) by mouth daily with breakfast. And decrease by one tablet daily 07/04/24   Tat, Lenis, MD  torsemide  (DEMADEX ) 20 MG tablet TAKE 1 TABLET BY MOUTH TWICE A DAY 07/07/24   Jolinda Potter HERO, DO    Allergies: Norvasc  [amlodipine ], Tape, Zestril  [lisinopril ], Hytrin  [terazosin ], Imdur  [isosorbide  nitrate], and Sulfonamide derivatives    Review of Systems  Constitutional:  Negative for appetite change and fatigue.  HENT:  Negative for congestion, ear discharge and sinus pressure.   Eyes:  Negative for discharge.  Respiratory:  Positive for shortness of breath. Negative for cough.  Cardiovascular:  Negative for chest pain.  Gastrointestinal:  Negative for abdominal pain and diarrhea.  Genitourinary:  Negative for frequency and hematuria.  Musculoskeletal:  Negative for back pain.  Skin:  Negative for rash.  Neurological:  Negative for seizures and headaches.  Psychiatric/Behavioral:  Negative for hallucinations.     Updated Vital Signs BP (!) 162/53   Pulse 63   Temp 98.5 F (36.9 C) (Oral)   Resp (!) 28   Ht 5' 5 (1.651 m)   Wt 49.1 kg   SpO2 96%   BMI 18.01 kg/m   Physical  Exam Vitals and nursing note reviewed.  Constitutional:      Appearance: She is well-developed.  HENT:     Head: Normocephalic.     Nose: Nose normal.  Eyes:     General: No scleral icterus.    Conjunctiva/sclera: Conjunctivae normal.  Neck:     Thyroid : No thyromegaly.  Cardiovascular:     Rate and Rhythm: Normal rate and regular rhythm.     Heart sounds: No murmur heard.    No friction rub. No gallop.  Pulmonary:     Breath sounds: No stridor. No wheezing or rales.  Chest:     Chest wall: No tenderness.  Abdominal:     General: There is no distension.     Tenderness: There is no abdominal tenderness. There is no rebound.  Musculoskeletal:        General: Normal range of motion.     Cervical back: Neck supple.  Lymphadenopathy:     Cervical: No cervical adenopathy.  Skin:    Findings: No erythema or rash.  Neurological:     Mental Status: She is alert and oriented to person, place, and time.     Motor: No abnormal muscle tone.     Coordination: Coordination normal.  Psychiatric:        Behavior: Behavior normal.     (all labs ordered are listed, but only abnormal results are displayed) Labs Reviewed  PRO BRAIN NATRIURETIC PEPTIDE - Abnormal; Notable for the following components:      Result Value   Pro Brain Natriuretic Peptide 5,883.0 (*)    All other components within normal limits  CBC WITH DIFFERENTIAL/PLATELET - Abnormal; Notable for the following components:   WBC 3.6 (*)    RBC 3.48 (*)    Hemoglobin 9.5 (*)    HCT 30.9 (*)    RDW 17.8 (*)    Platelets 97 (*)    Lymphs Abs 0.4 (*)    All other components within normal limits  COMPREHENSIVE METABOLIC PANEL WITH GFR - Abnormal; Notable for the following components:   Chloride 93 (*)    Glucose, Bld 128 (*)    BUN 32 (*)    Creatinine, Ser 1.61 (*)    GFR, Estimated 30 (*)    All other components within normal limits  TROPONIN T, HIGH SENSITIVITY - Abnormal; Notable for the following components:    Troponin T High Sensitivity 34 (*)    All other components within normal limits  TROPONIN T, HIGH SENSITIVITY - Abnormal; Notable for the following components:   Troponin T High Sensitivity 33 (*)    All other components within normal limits    EKG: EKG Interpretation Date/Time:  Wednesday July 20 2024 15:18:13 EST Ventricular Rate:  66 PR Interval:  206 QRS Duration:  94 QT Interval:  464 QTC Calculation: 486 R Axis:   -77  Text Interpretation: Normal sinus rhythm Pulmonary  disease pattern Incomplete right bundle branch block Left anterior fascicular block Minimal voltage criteria for LVH, may be normal variant ( Cornell product ) ST elevation consider inferior injury or acute infarct Consider right ventricular involvement in acute inferior infarct Abnormal ECG Confirmed by Griselda Norris [phone]) on 07/21/2024 7:30:25 PM  Radiology: [facility] Chest Port 1 View Result Date: 07/20/2024 EXAM: 1 VIEW(S) XRAY OF THE CHEST 07/20/2024 03:37:00 PM COMPARISON: 07/01/2024, 06/29/2024 and chest CT 06/29/2024. CLINICAL HISTORY: SOB (shortness of breath) FINDINGS: LUNGS AND PLEURA: Lungs are adequately inflated. Interval improving small right pleural effusion likely with associated basal atelectasis. Persistent subtle heterogeneous interstitial prominence without significant change from 07/01/2024 slightly improved overall compared to 06/29/2024. Findings compatible with improving infection. No pneumothorax. HEART AND MEDIASTINUM: Stable cardiomegaly. Aortic atherosclerosis. BONES AND SOFT TISSUES: No acute osseous abnormality. IMPRESSION: 1. Persistent subtle heterogeneous interstitial prominence with slight improvement compatible with improving infection. 2. Interval improving small right pleural effusion likely with associated basal atelectasis. 3. Stable cardiomegaly and aortic atherosclerosis. Electronically signed by: Toribio Agreste MD 07/20/2024 04:18 PM EST RP Workstation: [id number]     Procedures    Medications Ordered in the ED - No data to display                                  Medical Decision Making Amount and/or Complexity of Data Reviewed Labs: ordered. Radiology: ordered.   Patient with mild worsening congestive heart failure.  She will take an extra diuretic for 2 days and follow-up with her PCP     Final diagnoses:  None    ED Discharge Orders     None          Suzette Pac, MD 07/22/24 1151

## 2024-07-28 ENCOUNTER — Telehealth: Payer: Self-pay | Admitting: Family Medicine

## 2024-07-28 NOTE — Telephone Encounter (Signed)
 Granddaughter called in and states that since she came home from the hospital on prednisone  she felt great and could eat. Since she completed it she no longer has energy or an appetite. Do you think she would benefit form a daily prednisone ?

## 2024-07-29 NOTE — Telephone Encounter (Signed)
 Called and left message to call back.

## 2024-07-29 NOTE — Telephone Encounter (Signed)
 This would be totally up to her oncologist.  Prednisone  is not something we typically dose as a chronic medication.  It can negatively impact her immune system and she is already struggling with pancytopenia from cancer treatments.  I will cc [facility] for input.

## 2024-07-29 NOTE — Telephone Encounter (Signed)
 Called and spoke with family there are aware to call them back if the specialist says anything.

## 2024-07-31 ENCOUNTER — Other Ambulatory Visit: Payer: Self-pay | Admitting: Family Medicine

## 2024-07-31 DIAGNOSIS — I10 Essential (primary) hypertension: Secondary | ICD-10-CM

## 2024-08-01 NOTE — Telephone Encounter (Signed)
 Rebekah, do you have any recommendations for her?  She sees you on 1/28.

## 2024-08-02 ENCOUNTER — Other Ambulatory Visit (HOSPITAL_COMMUNITY): Payer: Self-pay

## 2024-08-02 NOTE — Telephone Encounter (Addendum)
 Thank you for your inquiry regarding [name] and whether or not we should use steroids for treatment of fatigue and poor appetite.  This is a somewhat controversial treatment in her case, especially as she does not officially have a diagnosis of active cancer.  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  If we were to work under the assumption that she does indeed have MDS, we could consider using dexamethasone  2 to 4 mg daily for appetite stimulation in the setting of cancer cachexia.  However, glucocorticoids are only effective for short-term symptom improvement.  The duration of appetite stimulation is often short-lived, and prolonged steroid therapy increases risk of infections, insulin resistance, and muscle myopathy.  Most guidelines suggest that its use be limited to patients with a life expectancy of a few weeks to a few months.  Therefore in her case, I do not think that steroids would provide any lasting benefit, and could end up causing more problems instead. Our in office nutritionist is only able to see patients with an active cancer diagnosis, but I will reach out to her and see if this patient's strongly suspected MDS, would qualify her for nutritionist visit.  Other options for appetite stimulation could include dronabinol, megestol, or mirtazapine .  [name], PA-C 08/02/2024 11:26 AM

## 2024-08-03 ENCOUNTER — Inpatient Hospital Stay

## 2024-08-03 VITALS — BP 163/61 | HR 62 | Temp 97.1°F | Resp 16

## 2024-08-03 DIAGNOSIS — D649 Anemia, unspecified: Secondary | ICD-10-CM

## 2024-08-03 DIAGNOSIS — M8580 Other specified disorders of bone density and structure, unspecified site: Secondary | ICD-10-CM

## 2024-08-03 LAB — SAMPLE TO BLOOD BANK

## 2024-08-03 LAB — CBC
HCT: 31 % — ABNORMAL LOW (ref 36.0–46.0)
Hemoglobin: 9.4 g/dL — ABNORMAL LOW (ref 12.0–15.0)
MCH: 26.9 pg (ref 26.0–34.0)
MCHC: 30.3 g/dL (ref 30.0–36.0)
MCV: 88.8 fL (ref 80.0–100.0)
Platelets: 115 K/uL — ABNORMAL LOW (ref 150–400)
RBC: 3.49 MIL/uL — ABNORMAL LOW (ref 3.87–5.11)
RDW: 17.4 % — ABNORMAL HIGH (ref 11.5–15.5)
WBC: 3 K/uL — ABNORMAL LOW (ref 4.0–10.5)
nRBC: 0 % (ref 0.0–0.2)

## 2024-08-03 MED ORDER — EPOETIN ALFA-EPBX 40000 UNIT/ML IJ SOLN
40000.0000 [IU] | Freq: Once | INTRAMUSCULAR | Status: AC
  Administered 2024-08-03: 11:00:00 40000 [IU] via SUBCUTANEOUS
  Filled 2024-08-03: qty 1

## 2024-08-03 NOTE — Patient Instructions (Signed)

## 2024-08-03 NOTE — Telephone Encounter (Signed)
 Please inform patient of her hematologist's recommendations below.  Essentially, as I suspected, it is NOT recommended to proceed with daily steroid use due to health risk associated with medication.

## 2024-08-03 NOTE — Progress Notes (Signed)
Patient presents today for Retacrit injection. Hemoglobin reviewed prior to administration. VSS tolerated without incident or complaint. See MAR for details. Patient stable during and after injection. Patient discharged in satisfactory condition with no s/s of distress noted.  

## 2024-08-04 NOTE — Telephone Encounter (Signed)
 Per DPR spoke with daughter Rosey Rosey verbalized understanding.

## 2024-08-15 ENCOUNTER — Encounter: Payer: Self-pay | Admitting: *Deleted

## 2024-08-17 ENCOUNTER — Inpatient Hospital Stay

## 2024-08-17 VITALS — BP 177/63 | HR 58 | Temp 96.6°F | Resp 18

## 2024-08-17 DIAGNOSIS — D649 Anemia, unspecified: Secondary | ICD-10-CM

## 2024-08-17 DIAGNOSIS — M8580 Other specified disorders of bone density and structure, unspecified site: Secondary | ICD-10-CM

## 2024-08-17 LAB — CBC
HCT: 32.6 % — ABNORMAL LOW (ref 36.0–46.0)
Hemoglobin: 10 g/dL — ABNORMAL LOW (ref 12.0–15.0)
MCH: 27.4 pg (ref 26.0–34.0)
MCHC: 30.7 g/dL (ref 30.0–36.0)
MCV: 89.3 fL (ref 80.0–100.0)
Platelets: 85 K/uL — ABNORMAL LOW (ref 150–400)
RBC: 3.65 MIL/uL — ABNORMAL LOW (ref 3.87–5.11)
RDW: 17.4 % — ABNORMAL HIGH (ref 11.5–15.5)
WBC: 2.8 K/uL — ABNORMAL LOW (ref 4.0–10.5)
nRBC: 0 % (ref 0.0–0.2)

## 2024-08-17 LAB — SAMPLE TO BLOOD BANK

## 2024-08-17 MED ORDER — EPOETIN ALFA-EPBX 40000 UNIT/ML IJ SOLN
40000.0000 [IU] | Freq: Once | INTRAMUSCULAR | Status: AC
  Administered 2024-08-17: 40000 [IU] via SUBCUTANEOUS
  Filled 2024-08-17: qty 1

## 2024-08-17 NOTE — Progress Notes (Signed)
 [name] presents today for Retacrit  injection per the provider's orders.  Stable during administration without incident; injection site WNL; see MAR for injection details.  Patient tolerated procedure well and without incident.  No questions or complaints noted at this time.

## 2024-08-17 NOTE — Patient Instructions (Signed)
 [facility] [place] [facility]  Discharge Instructions: Thank you for choosing [facility] to provide your oncology and hematology care.  If you have a lab appointment with the Cancer Center - please note that after April 8th, 2024, all labs will be drawn in the cancer center.  You do not have to check in or register with the main entrance as you have in the past but will complete your check-in in the cancer center.  Wear comfortable clothing and clothing appropriate for easy access to any Portacath or PICC line.   We strive to give you quality time with your provider. You may need to reschedule your appointment if you arrive late (15 or more minutes).  Arriving late affects you and other patients whose appointments are after yours.  Also, if you miss three or more appointments without notifying the office, you may be dismissed from the clinic at the provider's discretion.      For prescription refill requests, have your pharmacy contact our office and allow 72 hours for refills to be completed.    Today you received the following chemotherapy and/or immunotherapy agents Retacrit      To help prevent nausea and vomiting after your treatment, we encourage you to take your nausea medication as directed.  BELOW ARE SYMPTOMS THAT SHOULD BE REPORTED IMMEDIATELY: *FEVER GREATER THAN 100.4 F (38 C) OR HIGHER *CHILLS OR SWEATING *NAUSEA AND VOMITING THAT IS NOT CONTROLLED WITH YOUR NAUSEA MEDICATION *UNUSUAL SHORTNESS OF BREATH *UNUSUAL BRUISING OR BLEEDING *URINARY PROBLEMS (pain or burning when urinating, or frequent urination) *BOWEL PROBLEMS (unusual diarrhea, constipation, pain near the anus) TENDERNESS IN MOUTH AND THROAT WITH OR WITHOUT PRESENCE OF ULCERS (sore throat, sores in mouth, or a toothache) UNUSUAL RASH, SWELLING OR PAIN  UNUSUAL VAGINAL DISCHARGE OR ITCHING   Items with * indicate a potential emergency and should be followed up  as soon as possible or go to the Emergency Department if any problems should occur.  Please show the CHEMOTHERAPY ALERT CARD or IMMUNOTHERAPY ALERT CARD at check-in to the Emergency Department and triage nurse.  Should you have questions after your visit or need to cancel or reschedule your appointment, please contact [facility] [place] - A DEPT OF [facility] [phone]  and follow the prompts.  Office hours are 8:00 a.m. to 4:30 p.m. Monday - Friday. Please note that voicemails left after 4:00 p.m. may not be returned until the following business day.  We are closed weekends and major holidays. You have access to a nurse at all times for urgent questions. Please call the main number to the clinic [phone] and follow the prompts.  For any non-urgent questions, you may also contact your provider using MyChart. We now offer e-Visits for anyone 90 and older to request care online for non-urgent symptoms. For details visit [email or web address].   Also download the MyChart app! Go to the app store, search "MyChart", open the app, select [place], and log in with your MyChart username and password.

## 2024-08-21 ENCOUNTER — Other Ambulatory Visit: Payer: Self-pay | Admitting: Family Medicine

## 2024-08-30 ENCOUNTER — Telehealth: Payer: Self-pay | Admitting: Family Medicine

## 2024-08-30 DIAGNOSIS — Z0279 Encounter for issue of other medical certificate: Secondary | ICD-10-CM

## 2024-08-30 NOTE — Telephone Encounter (Signed)
 Rosey buffalo daughter dropped off fmla forms to be completed and signed.  Form Fee Paid? (Y/N)       yes     If NO, form is placed on front office manager desk to hold until payment received. If YES, then form will be placed in the RX/HH Nurse Coordinators box for completion.  Form will not be processed until payment is received

## 2024-08-31 ENCOUNTER — Telehealth: Payer: Self-pay

## 2024-08-31 ENCOUNTER — Ambulatory Visit: Payer: Self-pay

## 2024-08-31 ENCOUNTER — Inpatient Hospital Stay

## 2024-08-31 ENCOUNTER — Inpatient Hospital Stay: Attending: Oncology

## 2024-08-31 VITALS — BP 165/59 | HR 60 | Temp 97.7°F | Resp 17

## 2024-08-31 DIAGNOSIS — M8580 Other specified disorders of bone density and structure, unspecified site: Secondary | ICD-10-CM

## 2024-08-31 DIAGNOSIS — D61818 Other pancytopenia: Secondary | ICD-10-CM

## 2024-08-31 DIAGNOSIS — D649 Anemia, unspecified: Secondary | ICD-10-CM

## 2024-08-31 LAB — CBC WITH DIFFERENTIAL/PLATELET
Abs Immature Granulocytes: 0 K/uL (ref 0.00–0.07)
Basophils Absolute: 0 K/uL (ref 0.0–0.1)
Basophils Relative: 0 %
Eosinophils Absolute: 0 K/uL (ref 0.0–0.5)
Eosinophils Relative: 1 %
HCT: 32.7 % — ABNORMAL LOW (ref 36.0–46.0)
Hemoglobin: 10 g/dL — ABNORMAL LOW (ref 12.0–15.0)
Immature Granulocytes: 0 %
Lymphocytes Relative: 16 %
Lymphs Abs: 0.4 K/uL — ABNORMAL LOW (ref 0.7–4.0)
MCH: 27.2 pg (ref 26.0–34.0)
MCHC: 30.6 g/dL (ref 30.0–36.0)
MCV: 88.9 fL (ref 80.0–100.0)
Monocytes Absolute: 0.2 K/uL (ref 0.1–1.0)
Monocytes Relative: 9 %
Neutro Abs: 1.8 K/uL (ref 1.7–7.7)
Neutrophils Relative %: 74 %
Platelets: 98 K/uL — ABNORMAL LOW (ref 150–400)
RBC: 3.68 MIL/uL — ABNORMAL LOW (ref 3.87–5.11)
RDW: 16.9 % — ABNORMAL HIGH (ref 11.5–15.5)
WBC: 2.4 K/uL — ABNORMAL LOW (ref 4.0–10.5)
nRBC: 0 % (ref 0.0–0.2)

## 2024-08-31 LAB — COMPREHENSIVE METABOLIC PANEL WITH GFR
ALT: 8 U/L (ref 0–44)
AST: 23 U/L (ref 15–41)
Albumin: 4.3 g/dL (ref 3.5–5.0)
Alkaline Phosphatase: 49 U/L (ref 38–126)
Anion gap: 12 (ref 5–15)
BUN: 40 mg/dL — ABNORMAL HIGH (ref 8–23)
CO2: 34 mmol/L — ABNORMAL HIGH (ref 22–32)
Calcium: 8.8 mg/dL — ABNORMAL LOW (ref 8.9–10.3)
Chloride: 94 mmol/L — ABNORMAL LOW (ref 98–111)
Creatinine, Ser: 1.62 mg/dL — ABNORMAL HIGH (ref 0.44–1.00)
GFR, Estimated: 30 mL/min — ABNORMAL LOW
Glucose, Bld: 126 mg/dL — ABNORMAL HIGH (ref 70–99)
Potassium: 3.9 mmol/L (ref 3.5–5.1)
Sodium: 140 mmol/L (ref 135–145)
Total Bilirubin: 0.5 mg/dL (ref 0.0–1.2)
Total Protein: 7.1 g/dL (ref 6.5–8.1)

## 2024-08-31 LAB — VITAMIN B12: Vitamin B-12: 1338 pg/mL — ABNORMAL HIGH (ref 180–914)

## 2024-08-31 LAB — LACTATE DEHYDROGENASE: LDH: 189 U/L (ref 105–235)

## 2024-08-31 LAB — VITAMIN D 25 HYDROXY (VIT D DEFICIENCY, FRACTURES): Vit D, 25-Hydroxy: 70.7 ng/mL (ref 30–100)

## 2024-08-31 LAB — FERRITIN: Ferritin: 238 ng/mL (ref 11–307)

## 2024-08-31 LAB — SAMPLE TO BLOOD BANK

## 2024-08-31 LAB — FOLATE: Folate: >20 ng/mL

## 2024-08-31 LAB — IRON AND TIBC
Iron: 34 ug/dL (ref 28–170)
Saturation Ratios: 10 % — ABNORMAL LOW (ref 10.4–31.8)
TIBC: 326 ug/dL (ref 250–450)
UIBC: 293 ug/dL

## 2024-08-31 MED ORDER — EPOETIN ALFA-EPBX 40000 UNIT/ML IJ SOLN
40000.0000 [IU] | Freq: Once | INTRAMUSCULAR | Status: AC
  Administered 2024-08-31: 40000 [IU] via SUBCUTANEOUS
  Filled 2024-08-31: qty 1

## 2024-08-31 NOTE — Telephone Encounter (Signed)
 Received call from patient and she states that she just fell and has a large skin tear to her lower leg. Patient had already spoken to [facility] and they advised that she go to the ER and the patient had declined and wanted to speak with someone in office. I advised patient that we had no available appointments and she could not wait to have her leg assessed. She stated that she did not want to go to the hospital and I advised that she could go to Urgent Care and be evaluated. She then asked to speak directly to Dr Jolinda and I advised that she was not in the office today. I apologized for not being able to see her today and she said that she has been a patient here for years. I again apologized and she hung up. Several minutes later E2C2 contacted the office again with her daughter on the phone. Daughter also refusing to go to ER and requesting to speak with someone in our office. I took the call and daughter states that mom had fell and had a large skin tear to her lower leg. I then advised the daughter that we didn't have any appointments available today and she really needed to go to Urgent Care. The daughter then stated that her mother had been a patient here for years and she is always getting advised to go to the ER and the last time they did that the ER stated that they didn't need to be there. She asked if Dr Jolinda was in the office today and I advised that she was not. Daughter also stated that the last 3 times that her mother needed to be seen that we were unable to see her and should she find a new Dr? I apologized for not being able to schedule patient today and I understand her frustration. Daughter then hung up

## 2024-08-31 NOTE — Patient Instructions (Signed)

## 2024-08-31 NOTE — Progress Notes (Signed)
Patient presents today for Retacrit injection. Hemoglobin reviewed prior to administration. VSS tolerated without incident or complaint. See MAR for details. Patient stable during and after injection. Patient discharged in satisfactory condition with no s/s of distress noted.  

## 2024-08-31 NOTE — Telephone Encounter (Signed)
 Reviewed chart there is a telephone note in chart today 1:58 PM from LPN who spoke with patients family       Copied from CRM [id number]. Topic: Clinical - Red Word Triage >> Aug 31, 2024  1:30 PM Zebedee SAUNDERS wrote: Red Word that prompted transfer to Nurse Triage: Pt's daughter Cleatus Burr [phone] or [phone] demanding to speak with Dr. Jolinda, daughter spoke with Mitzi who told [name] to take pt to urgent care for fall and cut. Daughter refuse to take pt and is demanding that pt is seen by Dr. Jolinda, warm transferred to clinic. Pt's daughter Cleatus Burr [phone] or [phone] would like a call back.

## 2024-09-02 ENCOUNTER — Encounter: Payer: Self-pay | Admitting: Family Medicine

## 2024-09-02 ENCOUNTER — Ambulatory Visit: Admitting: Family Medicine

## 2024-09-02 VITALS — BP 160/55 | HR 62 | Temp 98.1°F | Ht 65.0 in | Wt 110.2 lb

## 2024-09-02 DIAGNOSIS — S81812D Laceration without foreign body, left lower leg, subsequent encounter: Secondary | ICD-10-CM

## 2024-09-02 LAB — METHYLMALONIC ACID, SERUM: Methylmalonic Acid, Quantitative: 910 nmol/L — ABNORMAL HIGH (ref 0–378)

## 2024-09-02 NOTE — Progress Notes (Signed)
 "  Subjective: CC: Recheck PCP: Jolinda Norene HERO, DO YEP:Dubwz B Mcculley is a 89 y.o. female presenting to clinic today for:  Patient here for 48-hour follow-up on recent urgent care visit where she had a very large skin tear of the left lower extremity.  She notes that they advised her to not remove the bandage for 24 hours but she left it on the entire 2 days and has not yet looked at the wound.  It apparently bled quite a bit yesterday.  She is accompanied today by her daughter.  She does not report any fevers.   ROS: Per HPI  Allergies[1] Past Medical History:  Diagnosis Date   Anemia    Arthritis    Asthma    Atrial fibrillation (HCC)    Basal cell carcinoma 02/06/2009   Left ear targus- ([facility])   Basal cell carcinoma 09/28/2008   Right back-(CX35FU)   Breast cancer (HCC) 2019   invasive and ductal ca   Breast cancer, right (HCC) 04/08/2018   CKD (chronic kidney disease), stage III (HCC)    COPD (chronic obstructive pulmonary disease) (HCC)    Duodenal ulcer 10/01/2021   Essential hypertension, malignant    Heart murmur    Hyperlipidemia    Hypertension    Hyponatremia 10/14/2015   Hypothyroidism    Nodule of right lung    Right upper lobe   Osteopenia due to cancer therapy 10/07/2022   Pneumonia    PONV (postoperative nausea and vomiting)    Renal insufficiency    Chronic   Renal vascular disease    Right renal artery stenosis 05/09/2015   Squamous cell carcinoma of skin 09/07/2019   KA-Left shin-txpbx   Tear of lateral meniscus of knee 10/27/2017   UGI bleed 08/31/2021   Vertigo    Current Medications[2] Social History   Socioeconomic History   Marital status: Widowed    Spouse name: Not on file   Number of children: 2   Years of education: 50   Highest education level: Not on file  Occupational History   Occupation: Retired  Tobacco Use   Smoking status: Never    Passive exposure: Never   Smokeless tobacco: Never  Vaping Use   Vaping status:  Never Used  Substance and Sexual Activity   Alcohol  use: No   Drug use: No   Sexual activity: Not Currently  Other Topics Concern   Not on file  Social History Narrative   Lives alone, but her daughter stays there at night   Ambidextrous (writes with left hand).   No caffeine use.   Social Drivers of Health   Tobacco Use: Low Risk (09/02/2024)   Patient History    Smoking Tobacco Use: Never    Smokeless Tobacco Use: Never    Passive Exposure: Never  Financial Resource Strain: Low Risk (12/18/2023)   Overall Financial Resource Strain (CARDIA)    Difficulty of Paying Living Expenses: Not hard at all  Food Insecurity: No Food Insecurity (06/29/2024)   Epic    Worried About Programme Researcher, Broadcasting/film/video in the Last Year: Never true    Ran Out of Food in the Last Year: Never true  Transportation Needs: No Transportation Needs (06/29/2024)   Epic    Lack of Transportation (Medical): No    Lack of Transportation (Non-Medical): No  Physical Activity: Inactive (12/18/2023)   Exercise Vital Sign    Days of Exercise per Week: 0 days    Minutes of Exercise per Session: 0  min  Stress: No Stress Concern Present (12/18/2023)   [employer] of Occupational Health - Occupational Stress Questionnaire    Feeling of Stress : Not at all  Social Connections: Moderately Integrated (06/29/2024)   Social Connection and Isolation Panel    Frequency of Communication with Friends and Family: Three times a week    Frequency of Social Gatherings with Friends and Family: Three times a week    Attends Religious Services: More than 4 times per year    Active Member of Clubs or Organizations: Yes    Attends Banker Meetings: 1 to 4 times per year    Marital Status: Widowed  Intimate Partner Violence: Not At Risk (06/29/2024)   Epic    Fear of Current or Ex-Partner: No    Emotionally Abused: No    Physically Abused: No    Sexually Abused: No  Depression (PHQ2-9): Low Risk (06/08/2024)   Depression  (PHQ2-9)    PHQ-2 Score: 0  Alcohol  Screen: Low Risk (12/18/2023)   Alcohol  Screen    Last Alcohol  Screening Score (AUDIT): 0  Housing: Low Risk (06/29/2024)   Epic    Unable to Pay for Housing in the Last Year: No    Number of Times Moved in the Last Year: 0    Homeless in the Last Year: No  Utilities: Not At Risk (06/29/2024)   Epic    Threatened with loss of utilities: No  Health Literacy: Adequate Health Literacy (12/18/2023)   B1300 Health Literacy    Frequency of need for help with medical instructions: Never   Family History  Problem Relation Age of Onset   Thyroid  disease Mother    Stroke Father    Hypertension Brother    Lung cancer Maternal Uncle    Cancer Maternal Grandmother        Liver   Lung cancer Maternal Uncle    Hypertension Daughter    Hypercholesterolemia Daughter    Hypercholesterolemia Daughter     Objective: Office vital signs reviewed. BP (!) 160/55   Pulse 62   Temp 98.1 F (36.7 C)   Ht 5' 5 (1.651 m)   Wt 110 lb 4 oz (50 kg)   SpO2 90%   BMI 18.35 kg/m   Physical Examination:  General: Awake, alert, toxic female no acute distress Pulmonary: Utilizing 2 to 3 L of oxygen  via nasal cannula.  Normal work of breathing Skin: Left lower leg just below the knee with very large softball sized skin tear.  The majority of the skin is intact and appears to blood flow.  She has a slightly oozing but relatively hemostatic skin tear.  No evidence of secondary infection.  Assessment/ Plan: 89 y.o. female   Noninfected skin tear of left lower extremity, subsequent encounter   Bandage and Steri-Strips were removed successfully here in the office after quite some time spent trying to gently remove this without tearing her skin.  Her wound is hemostatic and demonstrates no evidence of infection.  I discussed appropriate wound care, redressed her wound with nonstick bandages and advised that the Steri-Strips would fall off in the next 3 days or she could  remove them.  I scheduled her a 1 week follow-up with me for wound recheck I discussed red flag signs and symptoms warranting reevaluation and/or antibiotics prior to that time.  I reiterated that this bandage need to be changed out every 24 hours to prevent her skin from sticking to the bandage again  Total time  spent with patient 32 minutes.  Greater than 50% of encounter spent in removing the bandage that had adhered to her thin skin   Tinia Oravec CHRISTELLA [name], DO [facility] [phone]     [1]  Allergies Allergen Reactions   Norvasc  [Amlodipine ] Swelling    Edema    Tape Other (See Comments)    Has to have no stick tape.   Zestril  [Lisinopril ] Cough   Hytrin  [Terazosin ] Hives and Nausea Only   Imdur  [Isosorbide  Nitrate] Nausea Only   Sulfonamide Derivatives Nausea And Vomiting and Rash  [2]  Current Outpatient Medications:    atorvastatin  (LIPITOR) 20 MG tablet, TAKE 1/2 TABLET BY MOUTH DAILY, Disp: 45 tablet, Rfl: 0   bisoprolol  (ZEBETA ) 5 MG tablet, TAKE 1 TABLET (5 MG TOTAL) BY MOUTH DAILY., Disp: 90 tablet, Rfl: 0   Cholecalciferol  (VITAMIN D -3 PO), Take 1 capsule by mouth daily., Disp: , Rfl:    cloNIDine  (CATAPRES ) 0.1 MG tablet, Take 1 tablet (0.1 mg total) by mouth as directed. Take 1 tablet (0.1 mg total) by mouth daily as needed for systolic blood pressure greater than 180., Disp: 20 tablet, Rfl: 2   feeding supplement (ENSURE ENLIVE / ENSURE PLUS) LIQD, Take 237 mLs by mouth 2 (two) times daily between meals. (Patient taking differently: Take 237 mLs by mouth daily as needed (decreased appetite).), Disp: 237 mL, Rfl: 12   ferrous sulfate  325 (65 FE) MG tablet, Take 1 tablet (325 mg total) by mouth daily with breakfast., Disp: , Rfl:    flecainide  (TAMBOCOR ) 50 MG tablet, TAKE 1 TABLET BY MOUTH TWICE A DAY, Disp: 180 tablet, Rfl: 1   Fluticasone -Umeclidin-Vilant (TRELEGY ELLIPTA ) 100-62.5-25 MCG/ACT AEPB, Inhale 1 puff into the lungs daily., Disp: 60  each, Rfl: 5   hydrALAZINE  (APRESOLINE ) 50 MG tablet, TAKE 1 TABLET BY MOUTH TWICE A DAY, Disp: 180 tablet, Rfl: 1   levothyroxine  (SYNTHROID ) 75 MCG tablet, TAKE 1 TABLET BY MOUTH EVERY DAY, Disp: 90 tablet, Rfl: 1   mirtazapine  (REMERON ) 7.5 MG tablet, TAKE 1 TABLET BY MOUTH EVERYDAY AT BEDTIME, Disp: 90 tablet, Rfl: 0   Multiple Vitamins-Minerals (MULTIVITAMIN WOMEN 50+) TABS, Take 1 tablet by mouth at bedtime., Disp: , Rfl:    pantoprazole  (PROTONIX ) 40 MG tablet, TAKE 1 TABLET BY MOUTH EVERY DAY, Disp: 90 tablet, Rfl: 1   torsemide  (DEMADEX ) 20 MG tablet, TAKE 1 TABLET BY MOUTH TWICE A DAY, Disp: 180 tablet, Rfl: 1   Azelastine  HCl 137 MCG/SPRAY SOLN, PLACE 1 SPRAY INTO BOTH NOSTRILS 2 (TWO) TIMES DAILY. FOR RUNNY NOSE (Patient not taking: Reported on 09/02/2024), Disp: 90 mL, Rfl: 1   predniSONE  (DELTASONE ) 10 MG tablet, Take 5 tablets (50 mg total) by mouth daily with breakfast. And decrease by one tablet daily (Patient not taking: Reported on 09/02/2024), Disp: 15 tablet, Rfl: 0  "

## 2024-09-05 NOTE — Telephone Encounter (Signed)
 PCP completed and signed FMLA forms. They have been faxed to [facility] at fax number [phone]. Patient has been contacted and informed they are complete. Copy at front desk.

## 2024-09-08 ENCOUNTER — Encounter (HOSPITAL_BASED_OUTPATIENT_CLINIC_OR_DEPARTMENT_OTHER): Payer: Self-pay | Admitting: Pulmonary Disease

## 2024-09-08 ENCOUNTER — Ambulatory Visit (HOSPITAL_BASED_OUTPATIENT_CLINIC_OR_DEPARTMENT_OTHER): Admitting: Pulmonary Disease

## 2024-09-08 ENCOUNTER — Ambulatory Visit (HOSPITAL_BASED_OUTPATIENT_CLINIC_OR_DEPARTMENT_OTHER)

## 2024-09-08 VITALS — BP 141/69 | HR 70 | Ht 65.0 in | Wt <= 1120 oz

## 2024-09-08 DIAGNOSIS — I509 Heart failure, unspecified: Secondary | ICD-10-CM | POA: Diagnosis not present

## 2024-09-08 DIAGNOSIS — J90 Pleural effusion, not elsewhere classified: Secondary | ICD-10-CM | POA: Diagnosis not present

## 2024-09-08 DIAGNOSIS — J849 Interstitial pulmonary disease, unspecified: Secondary | ICD-10-CM

## 2024-09-08 DIAGNOSIS — J9611 Chronic respiratory failure with hypoxia: Secondary | ICD-10-CM

## 2024-09-08 DIAGNOSIS — I350 Nonrheumatic aortic (valve) stenosis: Secondary | ICD-10-CM

## 2024-09-08 DIAGNOSIS — I272 Pulmonary hypertension, unspecified: Secondary | ICD-10-CM | POA: Diagnosis not present

## 2024-09-08 MED ORDER — TRELEGY ELLIPTA 100-62.5-25 MCG/ACT IN AEPB: 1.0000 | INHALATION_SPRAY | Freq: Every day | RESPIRATORY_TRACT | 5 refills | Status: AC

## 2024-09-08 NOTE — Patient Instructions (Addendum)
 X CXR today   Refill on trelegy

## 2024-09-08 NOTE — Progress Notes (Signed)
 "  Subjective:    Patient ID: [name], female    DOB: 17-May-1934, 89 y.o.   MRN: [id number]   89 yo  never smoker  for FU of chronic respiratory failure due to CHF /pneumonia & PH due to severe AS -not a candidate for TAVR She was seen by VS in 08/2021 for asthmatic bronchitis after she had hospitalizations for pneumonia' >> continued on Symbicort  -it seems she was never diagnosed with asthma prior to this     PMH :CKD 3a, HTN,  Chr Diastolic CHF, A fib, mod AS - considered high risk even for TAVR Hypothyroidism, Rt renal artery stenosis, Vertigo, Breast cancer, Esophageal candidiasis  Leukopenia-advised bone marrow biopsy, refused , presumed myelodysplastic syndrome on Procrit every 2 weeks DNR   Initial OV 04/2023  She was hospitalized 8/15 to 8/17 for shortness of breath and noted to be hypoxic to 70%, she was discharged on 2 L oxygen   She has bibasal crackles, review of previous imaging shows increased interstitial infiltrates in 05/2022 which resolved suggesting that this was also pulmonary edema. >> sent an order to [employer] for a lightweight POC    She was hospitalized 05/2023 for bilateral pneumonia, leukocytosis and increased procalcitonin, treated with antibiotics and improved.  CT scan was done soon after her discharge for pneumonia and showed patchy bilateral infiltrates    OV 07/28/23 >> trelegy, pred x 2 week CXR >> Improving multifocal pneumonia.   Discussed the use of AI scribe software for clinical note transcription with the patient, who gave verbal consent to proceed.  History of Present Illness [name] is an 89 year old female with chronic respiratory failure and severe aortic stenosis who presents for follow-up of worsening respiratory symptoms. She is accompanied by her daughter.  She was  hospitalized 06/2024 for pneumonia and fluid overload requiring thoracentesis. Since that admission she has had worsening dyspnea and increased her oxygen  from 2 to 3  L, and sometimes 4 L with activity. She is more short of breath with exertion.  On December 3rd she went to the emergency room for shortness of breath and concern for recurrent fluid accumulation. She increased her diuretic dose and was discharged. She tracks her weight for fluid retention and noted recent weight gain, so she has taken extra doses of diuretics.  She has anemia monitored with blood tests and injections every other week. Her hemoglobin has only reached 10 g/dL. She had a syncopal episode after a blood draw, which she links to the volume drawn and not eating.  She takes diuretics twice daily and self-adjusts based on symptoms. She is on Trelegy but is unsure it helps. She recently completed a course of prednisone .  Her sister stays with her at night. She can cook and dress herself but has increased dyspnea with activity. Reviewed hosp dc summary Pleural fluid - low LDH & prot <3   Significant tests/ events reviewed PFTs 06/2023 moderate airway obstruction ratio 65, FEV1 42%, FVC 47%, no bronchodilator response, TLC normal, DLCO 40%   HRCT 12/2023 >> Shifting peribronchovascular ground-glass and peribronchovascular nodularity with predominant involvement of the right upper lobe. Diffuse micro nodular ground-glass and scattered septal thickening. Findings may be due to organizing pneumonia.   HRCT chest 05/2023 >>  Multifocal nodular consolidation in the lungs with septal thickening and ground-glass. Findings may be due to pneumonia. Given nodularity and thickening along the fissures, as well as minimally hyperdense mediastinal adenopathy, consider alveolar sarcoid   04/22/23 Oxygen  saturation at rest  on room air was 88% and dropped to 83% on ambulating 1 lap,  POC 2 L to 83%    03/2023 echo: LVEF 60-65%, grade II dd, mod MS mean grad 6, mod to severe AS mean grad 34, AVA VTI 0.93 DI 0.41    05/2022 VQ scan negative. CT abdomen 01/2021 shows clear lungs   Review of  Systems  neg for any significant sore throat, dysphagia, itching, sneezing, nasal congestion or excess/ purulent secretions, fever, chills, sweats, unintended wt loss, pleuritic or exertional cp, hempoptysis, orthopnea pnd or change in chronic leg swelling. Also denies presyncope, palpitations, heartburn, abdominal pain, nausea, vomiting, diarrhea or change in bowel or urinary habits, dysuria,hematuria, rash, arthralgias, visual complaints, headache, numbness weakness or ataxia.      Objective:   Physical Exam  Gen. Pleasant, elderly, thin frail woman on O2 Aten, in no distress ENT - no thrush, no pallor/icterus,no post nasal drip Neck: No JVD, no thyromegaly, no carotid bruits Lungs: no use of accessory muscles, no dullness to percussion, clear without rales or rhonchi  Cardiovascular: Rhythm regular, heart sounds  normal, ESM 3/6 all over precordium, no peripheral edema Musculoskeletal: No deformities, no cyanosis or clubbing        Assessment & Plan:   Assessment and Plan Assessment & Plan Chronic respiratory failure with hypoxia Chronic respiratory failure with worsening hypoxia, requiring increased oxygen  supplementation. Oxygen  saturation drops significantly with activity, necessitating increased oxygen  flow during exertion. Current oxygen  therapy is essential and not associated with addiction concerns. - Continue oxygen  therapy at 2 liters, increase to 4 liters during exertion as needed. - Monitor oxygen  saturation at home, aiming for levels above 90%. - Ordered chest x-ray to assess current status. -Doubt Trelegy helpful here, continue for now  Pleural effusion due to heart failure Pleural effusion secondary to heart failure, confirmed by fluid analysis showing no protein or signs of infection. Fluid management is crucial to prevent hospital admissions. Current management includes diuretics and monitoring for fluid accumulation. - Continue current diuretic regimen with torsemide ,  adjust dose based on symptoms and weight changes. - Monitor for signs of fluid accumulation, such as increased shortness of breath or weight gain. - Ordered chest x-ray to evaluate fluid status.  Severe aortic stenosis with pulmonary hypertension Severe aortic stenosis contributing to pulmonary hypertension and fluid retention. Surgical intervention is not feasible due to age and overall health status. Management focuses on symptom control and preventing hospital admissions. Discussed potential future need for hospice care as condition progresses. - Continue current management with oxygen  therapy and diuretics. - Monitor for worsening symptoms and consider hospice care if condition deteriorates.     "

## 2024-09-09 ENCOUNTER — Encounter: Payer: Self-pay | Admitting: Family Medicine

## 2024-09-09 ENCOUNTER — Ambulatory Visit (INDEPENDENT_AMBULATORY_CARE_PROVIDER_SITE_OTHER): Admitting: Family Medicine

## 2024-09-09 VITALS — BP 194/80 | HR 64 | Temp 98.0°F | Ht 65.0 in | Wt 112.1 lb

## 2024-09-09 DIAGNOSIS — I10 Essential (primary) hypertension: Secondary | ICD-10-CM | POA: Diagnosis not present

## 2024-09-09 DIAGNOSIS — S81812D Laceration without foreign body, left lower leg, subsequent encounter: Secondary | ICD-10-CM

## 2024-09-09 DIAGNOSIS — L089 Local infection of the skin and subcutaneous tissue, unspecified: Secondary | ICD-10-CM

## 2024-09-09 MED ORDER — CEPHALEXIN 500 MG PO CAPS: 500.0000 mg | ORAL_CAPSULE | Freq: Three times a day (TID) | ORAL | 0 refills | Status: AC

## 2024-09-09 MED ORDER — CEFTRIAXONE SODIUM 1 G IJ SOLR
1.0000 g | Freq: Once | INTRAMUSCULAR | Status: AC
  Administered 2024-09-09: 1 g via INTRAMUSCULAR

## 2024-09-09 NOTE — Progress Notes (Signed)
 "  Subjective: CC: Wound check PCP: Jolinda Janet HERO, DO YEP:[name] is a 89 y.o. female presenting to clinic today for:  Patient was seen on 09/02/2024 for noninfected skin tear of the left lower extremity just over her left knee.  She has been cleaning the wound daily with soap and water as directed and redressing it.  She reports some tenderness at the apex of the knee and was not sure if maybe it was becoming infected.  Reports no significant drainage except for some mild bloody discharge in denies any fevers, chills etc.   ROS: Per HPI  Allergies[1] Past Medical History:  Diagnosis Date   Anemia    Arthritis    Asthma    Atrial fibrillation (HCC)    Basal cell carcinoma 02/06/2009   Left ear targus- ([facility])   Basal cell carcinoma 09/28/2008   Right back-(CX35FU)   Breast cancer (HCC) 2019   invasive and ductal ca   Breast cancer, right (HCC) 04/08/2018   CKD (chronic kidney disease), stage III (HCC)    COPD (chronic obstructive pulmonary disease) (HCC)    Duodenal ulcer 10/01/2021   Essential hypertension, malignant    Heart murmur    Hyperlipidemia    Hypertension    Hyponatremia 10/14/2015   Hypothyroidism    Nodule of right lung    Right upper lobe   Osteopenia due to cancer therapy 10/07/2022   Pneumonia    PONV (postoperative nausea and vomiting)    Renal insufficiency    Chronic   Renal vascular disease    Right renal artery stenosis 05/09/2015   Squamous cell carcinoma of skin 09/07/2019   KA-Left shin-txpbx   Tear of lateral meniscus of knee 10/27/2017   UGI bleed 08/31/2021   Vertigo    Current Medications[2] Social History   Socioeconomic History   Marital status: Widowed    Spouse name: Not on file   Number of children: 2   Years of education: 71   Highest education level: Not on file  Occupational History   Occupation: Retired  Tobacco Use   Smoking status: Never    Passive exposure: Never   Smokeless tobacco: Never  Vaping Use    Vaping status: Never Used  Substance and Sexual Activity   Alcohol  use: No   Drug use: No   Sexual activity: Not Currently  Other Topics Concern   Not on file  Social History Narrative   Lives alone, but her daughter stays there at night   Ambidextrous (writes with left hand).   No caffeine use.   Social Drivers of Health   Tobacco Use: Low Risk (09/09/2024)   Patient History    Smoking Tobacco Use: Never    Smokeless Tobacco Use: Never    Passive Exposure: Never  Financial Resource Strain: Low Risk (12/18/2023)   Overall Financial Resource Strain (CARDIA)    Difficulty of Paying Living Expenses: Not hard at all  Food Insecurity: No Food Insecurity (06/29/2024)   Epic    Worried About Programme Researcher, Broadcasting/film/video in the Last Year: Never true    Ran Out of Food in the Last Year: Never true  Transportation Needs: No Transportation Needs (06/29/2024)   Epic    Lack of Transportation (Medical): No    Lack of Transportation (Non-Medical): No  Physical Activity: Inactive (12/18/2023)   Exercise Vital Sign    Days of Exercise per Week: 0 days    Minutes of Exercise per Session: 0 min  Stress:  No Stress Concern Present (12/18/2023)   [employer] of Occupational Health - Occupational Stress Questionnaire    Feeling of Stress : Not at all  Social Connections: Moderately Integrated (06/29/2024)   Social Connection and Isolation Panel    Frequency of Communication with Friends and Family: Three times a week    Frequency of Social Gatherings with Friends and Family: Three times a week    Attends Religious Services: More than 4 times per year    Active Member of Clubs or Organizations: Yes    Attends Banker Meetings: 1 to 4 times per year    Marital Status: Widowed  Intimate Partner Violence: Not At Risk (06/29/2024)   Epic    Fear of Current or Ex-Partner: No    Emotionally Abused: No    Physically Abused: No    Sexually Abused: No  Depression (PHQ2-9): Low Risk  (09/09/2024)   Depression (PHQ2-9)    PHQ-2 Score: 0  Alcohol  Screen: Low Risk (12/18/2023)   Alcohol  Screen    Last Alcohol  Screening Score (AUDIT): 0  Housing: Low Risk (06/29/2024)   Epic    Unable to Pay for Housing in the Last Year: No    Number of Times Moved in the Last Year: 0    Homeless in the Last Year: No  Utilities: Not At Risk (06/29/2024)   Epic    Threatened with loss of utilities: No  Health Literacy: Adequate Health Literacy (12/18/2023)   B1300 Health Literacy    Frequency of need for help with medical instructions: Never   Family History  Problem Relation Age of Onset   Thyroid  disease Mother    Stroke Father    Hypertension Brother    Lung cancer Maternal Uncle    Cancer Maternal Grandmother        Liver   Lung cancer Maternal Uncle    Hypertension Daughter    Hypercholesterolemia Daughter    Hypercholesterolemia Daughter     Objective: Office vital signs reviewed. BP (!) 194/80   Pulse 64   Temp 98 F (36.7 C)   Ht 5' 5 (1.651 m)   Wt 112 lb 2 oz (50.9 kg)   SpO2 (!) 88%   BMI 18.66 kg/m   Physical Examination:  General: Awake, alert, well nourished, No acute distress Skin: Healing skin tear that involves the entire left lateral knee.  She does have some yellow material at the apex with a slight odor.  No gross purulence appreciated.  No active bleeding.  Assessment/ Plan: 89 y.o. female   Infected skin tear - Plan: cefTRIAXone  (ROCEPHIN ) injection 1 g, cephALEXin  (KEFLEX ) 500 MG capsule  Essential hypertension   Given a dose of Rocephin  today and then she can start Keflex  tomorrow.  Continue wound care at home.  Her wound was redressed today.  She may follow-up in 1 week, sooner if concerns arise  Blood pressure was not controlled today.  She will contact me with blood pressure readings over the next several days. I'm increasing her hydralazine  to 3 times daily dosing.  Uncertain if today's level is reflective of rebound hypertension in the  setting of twice daily usage of this.   Janet CHRISTELLA [name], DO [facility] [phone]     [1]  Allergies Allergen Reactions   Norvasc  [Amlodipine ] Swelling    Edema    Tape Other (See Comments)    Has to have no stick tape.   Zestril  [Lisinopril ] Cough   Hytrin  [Terazosin ]  Hives and Nausea Only   Imdur  [Isosorbide  Nitrate] Nausea Only   Sulfonamide Derivatives Nausea And Vomiting and Rash  [2]  Current Outpatient Medications:    atorvastatin  (LIPITOR) 20 MG tablet, TAKE 1/2 TABLET BY MOUTH DAILY, Disp: 45 tablet, Rfl: 0   bisoprolol  (ZEBETA ) 5 MG tablet, TAKE 1 TABLET (5 MG TOTAL) BY MOUTH DAILY., Disp: 90 tablet, Rfl: 0   Cholecalciferol  (VITAMIN D -3 PO), Take 1 capsule by mouth daily., Disp: , Rfl:    cloNIDine  (CATAPRES ) 0.1 MG tablet, Take 1 tablet (0.1 mg total) by mouth as directed. Take 1 tablet (0.1 mg total) by mouth daily as needed for systolic blood pressure greater than 180., Disp: 20 tablet, Rfl: 2   feeding supplement (ENSURE ENLIVE / ENSURE PLUS) LIQD, Take 237 mLs by mouth 2 (two) times daily between meals. (Patient taking differently: Take 237 mLs by mouth daily as needed (decreased appetite).), Disp: 237 mL, Rfl: 12   ferrous sulfate  325 (65 FE) MG tablet, Take 1 tablet (325 mg total) by mouth daily with breakfast., Disp: , Rfl:    flecainide  (TAMBOCOR ) 50 MG tablet, TAKE 1 TABLET BY MOUTH TWICE A DAY, Disp: 180 tablet, Rfl: 1   Fluticasone -Umeclidin-Vilant (TRELEGY ELLIPTA ) 100-62.5-25 MCG/ACT AEPB, Inhale 1 puff into the lungs daily., Disp: 60 each, Rfl: 5   hydrALAZINE  (APRESOLINE ) 50 MG tablet, TAKE 1 TABLET BY MOUTH TWICE A DAY, Disp: 180 tablet, Rfl: 1   levothyroxine  (SYNTHROID ) 75 MCG tablet, TAKE 1 TABLET BY MOUTH EVERY DAY, Disp: 90 tablet, Rfl: 1   mirtazapine  (REMERON ) 7.5 MG tablet, TAKE 1 TABLET BY MOUTH EVERYDAY AT BEDTIME, Disp: 90 tablet, Rfl: 0   Multiple Vitamins-Minerals (MULTIVITAMIN WOMEN 50+) TABS, Take 1 tablet by  mouth at bedtime., Disp: , Rfl:    pantoprazole  (PROTONIX ) 40 MG tablet, TAKE 1 TABLET BY MOUTH EVERY DAY, Disp: 90 tablet, Rfl: 1   torsemide  (DEMADEX ) 20 MG tablet, TAKE 1 TABLET BY MOUTH TWICE A DAY, Disp: 180 tablet, Rfl: 1   Azelastine  HCl 137 MCG/SPRAY SOLN, PLACE 1 SPRAY INTO BOTH NOSTRILS 2 (TWO) TIMES DAILY. FOR RUNNY NOSE (Patient not taking: Reported on 09/09/2024), Disp: 90 mL, Rfl: 1  "

## 2024-09-12 ENCOUNTER — Ambulatory Visit: Payer: Self-pay | Admitting: Pulmonary Disease

## 2024-09-14 ENCOUNTER — Inpatient Hospital Stay

## 2024-09-14 ENCOUNTER — Telehealth: Payer: Self-pay

## 2024-09-14 ENCOUNTER — Telehealth: Payer: Self-pay | Admitting: Family Medicine

## 2024-09-14 ENCOUNTER — Encounter: Payer: Self-pay | Admitting: Family Medicine

## 2024-09-14 ENCOUNTER — Inpatient Hospital Stay: Admitting: Physician Assistant

## 2024-09-14 MED ORDER — HYDRALAZINE HCL 50 MG PO TABS: 50.0000 mg | ORAL_TABLET | Freq: Three times a day (TID) | ORAL | 1 refills | Status: AC

## 2024-09-14 NOTE — Telephone Encounter (Signed)
 Reached out to patient to follow up on blood pressure readings since blood pressure was elevated in the office at her recent appointment. Patient stated that her blood pressures have been running 126/53 122/49 and it stays in that range. She states that she thinks that she should hold off on the hydralazine .

## 2024-09-14 NOTE — Telephone Encounter (Signed)
 Copied from CRM #[id number]. Topic: Clinical - Medical Advice >> Sep 14, 2024  1:16 PM Everette C wrote: Reason for CRM: The patient has called to share that their BP is currently 120/48 and that was checked within the last 10 minutes of calling. Please contact further when possible

## 2024-09-15 NOTE — Telephone Encounter (Signed)
 Pt states her BP was 120/48 but this is a duplicate call. This has been addressed and sent to provider in another phone call. Pt denies any weakness, dizziness etc.

## 2024-09-15 NOTE — Progress Notes (Signed)
 "  Subjective: RR:mzryzrx wound BP PCP: Jolinda Norene HERO, DO YEP:Dubwz B Route is a 89 y.o. female presenting to clinic today for:  Patient here for interval checkup on infected skin tear of the left knee.  She was started on Keflex  on 09/09/2024.  She has been compliant with medicine and is on her last day of medication.  She reports no purulent material.  Tenderness is gotten better.  They have continued wound care dressings as directed.  She reports blood pressures at home have been controlled and was as low as 120/48.  She did not go up to 3 times daily dosing of hydralazine .  She denies any chest pain.  Shortness of breath is baseline.  She saw her pulmonologist on 09/08/2024 and was told that there was nothing else they could do to aid in her breathing.  She has been compliant with Trelegy 100 but it does not seem to be helping.  Her daughter asked that we listen to her lungs today.  She also has a form from [facility] that needs to be completed so that she can have prioritized power should and outage occur as she is oxygen  dependent   ROS: Per HPI  Allergies[1] Past Medical History:  Diagnosis Date   Anemia    Arthritis    Asthma    Atrial fibrillation (HCC)    Basal cell carcinoma 02/06/2009   Left ear targus- (MOHS)   Basal cell carcinoma 09/28/2008   Right back-(CX35FU)   Breast cancer (HCC) 2019   invasive and ductal ca   Breast cancer, right (HCC) 04/08/2018   CKD (chronic kidney disease), stage III (HCC)    COPD (chronic obstructive pulmonary disease) (HCC)    Duodenal ulcer 10/01/2021   Essential hypertension, malignant    Heart murmur    Hyperlipidemia    Hypertension    Hyponatremia 10/14/2015   Hypothyroidism    Nodule of right lung    Right upper lobe   Osteopenia due to cancer therapy 10/07/2022   Pneumonia    PONV (postoperative nausea and vomiting)    Renal insufficiency    Chronic   Renal vascular disease    Right renal artery stenosis 05/09/2015    Squamous cell carcinoma of skin 09/07/2019   KA-Left shin-txpbx   Tear of lateral meniscus of knee 10/27/2017   UGI bleed 08/31/2021   Vertigo    Current Medications[2] Social History   Socioeconomic History   Marital status: Widowed    Spouse name: Not on file   Number of children: 2   Years of education: 85   Highest education level: Not on file  Occupational History   Occupation: Retired  Tobacco Use   Smoking status: Never    Passive exposure: Never   Smokeless tobacco: Never  Vaping Use   Vaping status: Never Used  Substance and Sexual Activity   Alcohol  use: No   Drug use: No   Sexual activity: Not Currently  Other Topics Concern   Not on file  Social History Narrative   Lives alone, but her daughter stays there at night   Ambidextrous (writes with left hand).   No caffeine use.   Social Drivers of Health   Tobacco Use: Low Risk (09/14/2024)   Patient History    Smoking Tobacco Use: Never    Smokeless Tobacco Use: Never    Passive Exposure: Never  Financial Resource Strain: Low Risk (12/18/2023)   Overall Financial Resource Strain (CARDIA)    Difficulty of Paying  Living Expenses: Not hard at all  Food Insecurity: No Food Insecurity (06/29/2024)   Epic    Worried About Programme Researcher, Broadcasting/film/video in the Last Year: Never true    Ran Out of Food in the Last Year: Never true  Transportation Needs: No Transportation Needs (06/29/2024)   Epic    Lack of Transportation (Medical): No    Lack of Transportation (Non-Medical): No  Physical Activity: Inactive (12/18/2023)   Exercise Vital Sign    Days of Exercise per Week: 0 days    Minutes of Exercise per Session: 0 min  Stress: No Stress Concern Present (12/18/2023)   [employer] of Occupational Health - Occupational Stress Questionnaire    Feeling of Stress : Not at all  Social Connections: Moderately Integrated (06/29/2024)   Social Connection and Isolation Panel    Frequency of Communication with Friends and Family:  Three times a week    Frequency of Social Gatherings with Friends and Family: Three times a week    Attends Religious Services: More than 4 times per year    Active Member of Clubs or Organizations: Yes    Attends Banker Meetings: 1 to 4 times per year    Marital Status: Widowed  Intimate Partner Violence: Not At Risk (06/29/2024)   Epic    Fear of Current or Ex-Partner: No    Emotionally Abused: No    Physically Abused: No    Sexually Abused: No  Depression (PHQ2-9): Low Risk (09/09/2024)   Depression (PHQ2-9)    PHQ-2 Score: 0  Alcohol  Screen: Low Risk (12/18/2023)   Alcohol  Screen    Last Alcohol  Screening Score (AUDIT): 0  Housing: Low Risk (06/29/2024)   Epic    Unable to Pay for Housing in the Last Year: No    Number of Times Moved in the Last Year: 0    Homeless in the Last Year: No  Utilities: Not At Risk (06/29/2024)   Epic    Threatened with loss of utilities: No  Health Literacy: Adequate Health Literacy (12/18/2023)   B1300 Health Literacy    Frequency of need for help with medical instructions: Never   Family History  Problem Relation Age of Onset   Thyroid  disease Mother    Stroke Father    Hypertension Brother    Lung cancer Maternal Uncle    Cancer Maternal Grandmother        Liver   Lung cancer Maternal Uncle    Hypertension Daughter    Hypercholesterolemia Daughter    Hypercholesterolemia Daughter     Objective: Office vital signs reviewed. BP (!) 173/57   Pulse 62   Temp 98.3 F (36.8 C)   Ht 5' 5 (1.651 m)   Wt 111 lb 6 oz (50.5 kg)   SpO2 (!) 82%   BMI 18.53 kg/m   Physical Examination:  General: Awake, alert, nontoxic but chronically ill-appearing elderly female, No acute distress HEENT: Sclera white.  Moist mucous membranes Cardio: regular rate and rhythm, S1S2 heard, loud heart murmurs appreciated Pulm: clear to auscultation bilaterally, mild bibasilar wheezes, no rhonchi or rales; normal work of breathing on oxygen  via  nasal cannula Extremities: No edema Skin: Healing skin tear on the left knee.  She does have some hyperemia but no significant warmth.  No exudates.  No bleeding.  Most of the wound has scabbed over except for the lateral arc of the skin tear where she still has some granular tissue appreciated.  Assessment/ Plan: 89 y.o.  female   Essential hypertension - Plan: AMB Referral [facility] Care Management  Noninfected skin tear of left lower extremity, subsequent encounter - Plan: mupirocin  cream (BACTROBAN ) 2 %  Chronic respiratory failure with hypoxia (HCC) - Plan: budesonide -glycopyrrolate -formoterol  (BREZTRI  AEROSPHERE) 160-9-4.8 MCG/ACT AERO inhaler, AMB Referral [facility] Care Management  ILD (interstitial lung disease) (HCC) - Plan: AMB Referral [facility] Care Management   Blood pressure not at goal here in office but is at goal at home.  I would like her to continue home blood pressure readings and we discussed anything above 140/90 requires her to start that 3 times daily dosing of hydralazine .  Both she and her daughter voiced good understanding will keep an eye on the blood pressure and get back to me the next couple of weeks with the readings  I have added Bactroban  to have on hand as we go into this I see whether just in case that she has any concerns about infection starting to appear.  Like her to utilize this with her wound care dressings and then once this small part of the skin scabs over she can discontinue use  I gave her a sample of Breztri  to try instead of the Trelegy.  Uncertain if this will be helpful or not as she has a interstitial lung disease.  I have also placed referral to [facility] to aid in both blood pressure monitoring and management as well as see if we can get her help with her inhaler cost   Hlee Fringer CHRISTELLA [name], DO [facility] [phone]     [1]  Allergies Allergen Reactions   Norvasc  [Amlodipine ] Swelling    Edema    Tape Other (See Comments)     Has to have no stick tape.   Zestril  [Lisinopril ] Cough   Hytrin  [Terazosin ] Hives and Nausea Only   Imdur  [Isosorbide  Nitrate] Nausea Only   Sulfonamide Derivatives Nausea And Vomiting and Rash  [2]  Current Outpatient Medications:    atorvastatin  (LIPITOR) 20 MG tablet, TAKE 1/2 TABLET BY MOUTH DAILY, Disp: 45 tablet, Rfl: 0   Azelastine  HCl 137 MCG/SPRAY SOLN, PLACE 1 SPRAY INTO BOTH NOSTRILS 2 (TWO) TIMES DAILY. FOR RUNNY NOSE (Patient not taking: Reported on 09/09/2024), Disp: 90 mL, Rfl: 1   bisoprolol  (ZEBETA ) 5 MG tablet, TAKE 1 TABLET (5 MG TOTAL) BY MOUTH DAILY., Disp: 90 tablet, Rfl: 0   cephALEXin  (KEFLEX ) 500 MG capsule, Take 1 capsule (500 mg total) by mouth 3 (three) times daily for 7 days. Start 09/10/24, Disp: 21 capsule, Rfl: 0   Cholecalciferol  (VITAMIN D -3 PO), Take 1 capsule by mouth daily., Disp: , Rfl:    cloNIDine  (CATAPRES ) 0.1 MG tablet, Take 1 tablet (0.1 mg total) by mouth as directed. Take 1 tablet (0.1 mg total) by mouth daily as needed for systolic blood pressure greater than 180., Disp: 20 tablet, Rfl: 2   feeding supplement (ENSURE ENLIVE / ENSURE PLUS) LIQD, Take 237 mLs by mouth 2 (two) times daily between meals. (Patient taking differently: Take 237 mLs by mouth daily as needed (decreased appetite).), Disp: 237 mL, Rfl: 12   ferrous sulfate  325 (65 FE) MG tablet, Take 1 tablet (325 mg total) by mouth daily with breakfast., Disp: , Rfl:    flecainide  (TAMBOCOR ) 50 MG tablet, TAKE 1 TABLET BY MOUTH TWICE A DAY, Disp: 180 tablet, Rfl: 1   Fluticasone -Umeclidin-Vilant (TRELEGY ELLIPTA ) 100-62.5-25 MCG/ACT AEPB, Inhale 1 puff into the lungs daily., Disp: 60 each, Rfl: 5   hydrALAZINE  (APRESOLINE )  50 MG tablet, Take 1 tablet (50 mg total) by mouth 3 (three) times daily. **SIG change, Disp: 270 tablet, Rfl: 1   levothyroxine  (SYNTHROID ) 75 MCG tablet, TAKE 1 TABLET BY MOUTH EVERY DAY, Disp: 90 tablet, Rfl: 1   mirtazapine  (REMERON ) 7.5 MG tablet, TAKE 1 TABLET BY  MOUTH EVERYDAY AT BEDTIME, Disp: 90 tablet, Rfl: 0   Multiple Vitamins-Minerals (MULTIVITAMIN WOMEN 50+) TABS, Take 1 tablet by mouth at bedtime., Disp: , Rfl:    pantoprazole  (PROTONIX ) 40 MG tablet, TAKE 1 TABLET BY MOUTH EVERY DAY, Disp: 90 tablet, Rfl: 1   torsemide  (DEMADEX ) 20 MG tablet, TAKE 1 TABLET BY MOUTH TWICE A DAY, Disp: 180 tablet, Rfl: 1  "

## 2024-09-16 ENCOUNTER — Encounter: Payer: Self-pay | Admitting: Family Medicine

## 2024-09-16 ENCOUNTER — Ambulatory Visit: Admitting: Family Medicine

## 2024-09-16 VITALS — BP 173/57 | HR 62 | Temp 98.3°F | Ht 65.0 in | Wt 111.4 lb

## 2024-09-16 DIAGNOSIS — J9611 Chronic respiratory failure with hypoxia: Secondary | ICD-10-CM

## 2024-09-16 DIAGNOSIS — I10 Essential (primary) hypertension: Secondary | ICD-10-CM | POA: Diagnosis not present

## 2024-09-16 DIAGNOSIS — S81812D Laceration without foreign body, left lower leg, subsequent encounter: Secondary | ICD-10-CM

## 2024-09-16 DIAGNOSIS — J849 Interstitial pulmonary disease, unspecified: Secondary | ICD-10-CM

## 2024-09-16 MED ORDER — BREZTRI AEROSPHERE 160-9-4.8 MCG/ACT IN AERO: 2.0000 | INHALATION_SPRAY | Freq: Two times a day (BID) | RESPIRATORY_TRACT | Status: AC

## 2024-09-16 MED ORDER — MUPIROCIN CALCIUM 2 % EX CREA: 1.0000 | TOPICAL_CREAM | Freq: Two times a day (BID) | CUTANEOUS | 0 refills | Status: AC

## 2024-09-18 ENCOUNTER — Encounter: Payer: Self-pay | Admitting: *Deleted

## 2024-09-19 ENCOUNTER — Emergency Department (HOSPITAL_BASED_OUTPATIENT_CLINIC_OR_DEPARTMENT_OTHER)
Admission: EM | Admit: 2024-09-19 | Discharge: 2024-09-19 | Disposition: A | Discharge status: Home / Self Care | Attending: Emergency Medicine | Admitting: Emergency Medicine

## 2024-09-19 ENCOUNTER — Inpatient Hospital Stay

## 2024-09-19 ENCOUNTER — Other Ambulatory Visit: Payer: Self-pay

## 2024-09-19 ENCOUNTER — Inpatient Hospital Stay: Admitting: Physician Assistant

## 2024-09-19 ENCOUNTER — Encounter (HOSPITAL_BASED_OUTPATIENT_CLINIC_OR_DEPARTMENT_OTHER): Payer: Self-pay

## 2024-09-19 DIAGNOSIS — I130 Hypertensive heart and chronic kidney disease with heart failure and stage 1 through stage 4 chronic kidney disease, or unspecified chronic kidney disease: Secondary | ICD-10-CM | POA: Insufficient documentation

## 2024-09-19 DIAGNOSIS — L259 Unspecified contact dermatitis, unspecified cause: Secondary | ICD-10-CM | POA: Insufficient documentation

## 2024-09-19 DIAGNOSIS — Z79899 Other long term (current) drug therapy: Secondary | ICD-10-CM | POA: Insufficient documentation

## 2024-09-19 DIAGNOSIS — N189 Chronic kidney disease, unspecified: Secondary | ICD-10-CM | POA: Insufficient documentation

## 2024-09-19 DIAGNOSIS — Z7951 Long term (current) use of inhaled steroids: Secondary | ICD-10-CM | POA: Insufficient documentation

## 2024-09-19 DIAGNOSIS — I509 Heart failure, unspecified: Secondary | ICD-10-CM | POA: Insufficient documentation

## 2024-09-19 DIAGNOSIS — J449 Chronic obstructive pulmonary disease, unspecified: Secondary | ICD-10-CM | POA: Insufficient documentation

## 2024-09-19 LAB — CBC
HCT: 30.6 % — ABNORMAL LOW (ref 36.0–46.0)
Hemoglobin: 9.5 g/dL — ABNORMAL LOW (ref 12.0–15.0)
MCH: 26.5 pg (ref 26.0–34.0)
MCHC: 31 g/dL (ref 30.0–36.0)
MCV: 85.5 fL (ref 80.0–100.0)
Platelets: 118 10*3/uL — ABNORMAL LOW (ref 150–400)
RBC: 3.58 MIL/uL — ABNORMAL LOW (ref 3.87–5.11)
RDW: 16.2 % — ABNORMAL HIGH (ref 11.5–15.5)
WBC: 3.1 10*3/uL — ABNORMAL LOW (ref 4.0–10.5)
nRBC: 0 % (ref 0.0–0.2)

## 2024-09-19 LAB — COMPREHENSIVE METABOLIC PANEL WITH GFR
ALT: 8 U/L (ref 0–44)
AST: 24 U/L (ref 15–41)
Albumin: 4.3 g/dL (ref 3.5–5.0)
Alkaline Phosphatase: 53 U/L (ref 38–126)
Anion gap: 8 (ref 5–15)
BUN: 29 mg/dL — ABNORMAL HIGH (ref 8–23)
CO2: 37 mmol/L — ABNORMAL HIGH (ref 22–32)
Calcium: 9.6 mg/dL (ref 8.9–10.3)
Chloride: 91 mmol/L — ABNORMAL LOW (ref 98–111)
Creatinine, Ser: 1.65 mg/dL — ABNORMAL HIGH (ref 0.44–1.00)
GFR, Estimated: 30 mL/min — ABNORMAL LOW
Glucose, Bld: 110 mg/dL — ABNORMAL HIGH (ref 70–99)
Potassium: 3.9 mmol/L (ref 3.5–5.1)
Sodium: 136 mmol/L (ref 135–145)
Total Bilirubin: 0.6 mg/dL (ref 0.0–1.2)
Total Protein: 7.2 g/dL (ref 6.5–8.1)

## 2024-09-19 MED ORDER — TRIAMCINOLONE ACETONIDE 0.1 % EX CREA: 1.0000 | TOPICAL_CREAM | Freq: Two times a day (BID) | CUTANEOUS | 0 refills | Status: AC

## 2024-09-19 MED ORDER — TRIAMCINOLONE ACETONIDE 0.1 % EX CREA: TOPICAL_CREAM | Freq: Once | CUTANEOUS | Status: DC

## 2024-09-19 MED ORDER — HYDROCORTISONE 1 % EX CREA
TOPICAL_CREAM | Freq: Once | CUTANEOUS | Status: AC
  Filled 2024-09-19: qty 28

## 2024-09-19 MED ORDER — HYDROXYZINE HCL 25 MG PO TABS: 25.0000 mg | ORAL_TABLET | Freq: Four times a day (QID) | ORAL | 0 refills | Status: AC | PRN

## 2024-09-19 NOTE — Progress Notes (Signed)
 Pt will need a wheelchair while ambulating due to her oxygen  dropping.

## 2024-09-21 ENCOUNTER — Ambulatory Visit: Payer: Self-pay | Admitting: *Deleted

## 2024-09-21 ENCOUNTER — Telehealth: Payer: Self-pay

## 2024-09-21 ENCOUNTER — Inpatient Hospital Stay

## 2024-09-21 ENCOUNTER — Inpatient Hospital Stay: Admitting: Physician Assistant

## 2024-09-21 NOTE — Progress Notes (Signed)
 Complex Care Management Note  Care Guide Note 09/21/2024 Name: VALENE VILLA MRN: [id number] DOB: 01-Dec-1933  TANESHA ARAMBULA is a 89 y.o. year old female who sees Jolinda Norene HERO, DO for primary care. I reached out to Whitney KATHEE Pepper by phone today to offer complex care management services.  Ms. Vasconez was given information about Complex Care Management services today including:   The Complex Care Management services include support from the care team which includes your Nurse Care Manager, Clinical Social Worker, or Pharmacist.  The Complex Care Management team is here to help remove barriers to the health concerns and goals most important to you. Complex Care Management services are voluntary, and the patient may decline or stop services at any time by request to their care team member.   Complex Care Management Consent Status: Patient agreed to services and verbal consent obtained.   Follow up plan:  Telephone appointment with complex care management team member scheduled for:  10/07/2024  Encounter Outcome:  Patient Scheduled  Jeoffrey Buffalo , RMA     [place]  [facility], [facility] Guide  Direct Dial: [phone]  Website: [email or web address]

## 2024-09-21 NOTE — Progress Notes (Signed)
 Care Guide Pharmacy Note  09/21/2024 Name: [name] MRN: [id number] DOB: 05-09-1934  Referred By: Jolinda Norene HERO, DO Reason for referral: Complex Care Management (Outreach to schedule with Pharm d and RNCM)   [name] is a 89 y.o. year old female who is a primary care patient of Jolinda Norene HERO, DO.  Whitney KATHEE Pepper was referred to the pharmacist for assistance related to: HTN and COPD  Successful contact was made with the patient to discuss pharmacy services including being ready for the pharmacist to call at least 5 minutes before the scheduled appointment time and to have medication bottles and any blood pressure readings ready for review. The patient agreed to meet with the pharmacist via telephone visit on (date/time).09/27/2024  Jeoffrey Buffalo , RMA     [place]  [facility], [facility] Guide  Direct Dial: [phone]  Website: [email or web address]

## 2024-09-21 NOTE — Telephone Encounter (Signed)
" ° °  FYI Only or Action Required?: FYI only for provider: appointment scheduled on 09/22/24.  Patient was last seen in primary care on 09/16/2024 by Jolinda Norene HERO, DO.  Called Nurse Triage reporting Allergic Reaction and Rash.  Symptoms began several days ago.  Interventions attempted: Prescription medications: hydroxyzine  .  Symptoms are: rapidly worsening.  Triage Disposition: See Physician Within 24 Hours  Patient/caregiver understands and will follow disposition?: Yes                 Reason for Disposition  [1] Localized rash is very painful AND [2] no fever  Answer Assessment - Initial Assessment Questions Appt scheduled tomorrow with other provider. Patient granddaughter requesting appt with any provider today due to intense itching. Called [facility] to see if any available appt today and transferred to [facility], triage nurse. No available visits today with any provider. Recommended to caller either [facility] or back to [facility] ED or schedule appt tomorrow and try more care advise. Patient granddaughter scheduled appt. Recommended if sx worsen go to ED. No chest pain no difficulty breathing no fever. See ED visit Sunday and recommended bendadryl, Triamolone cream and hydroxyzine         1. APPEARANCE of RASH: What does the rash look like? (e.g., blisters, dry flaky skin, red spots, redness, sores)     Rash to back continues 2. LOCATION: Where is the rash located?      Mostly back   5. ONSET: When did the rash start?      Last week and went to [facility] Sunday  6. ITCHING: Does the rash itch? If Yes, ask: How bad is the itch?  (Scale 0-10; or none, mild, moderate, severe)     severe 7. PAIN: Does the rash hurt? If Yes, ask: How bad is the pain?  (Scale 0-10; or none, mild, moderate, severe)     Did not report 8. OTHER SYMPTOMS: Do you have any other symptoms? (e.g., fever)     Rash to back not relieved by hydroxyzine  or hydro cortisone cream. Severe itching , burning  and tingling.  Protocols used: Rash or Redness - Localized-A-AH  "

## 2024-09-22 ENCOUNTER — Ambulatory Visit: Admitting: Family Medicine

## 2024-09-22 ENCOUNTER — Encounter: Payer: Self-pay | Admitting: Family Medicine

## 2024-09-22 VITALS — BP 199/38 | HR 56 | Temp 98.1°F | Ht 65.0 in | Wt 110.0 lb

## 2024-09-22 DIAGNOSIS — L299 Pruritus, unspecified: Secondary | ICD-10-CM | POA: Diagnosis not present

## 2024-09-22 MED ORDER — METHYLPREDNISOLONE ACETATE 80 MG/ML IJ SUSP
80.0000 mg | Freq: Once | INTRAMUSCULAR | Status: AC
  Administered 2024-09-22: 40 mg via INTRAMUSCULAR

## 2024-09-22 NOTE — Progress Notes (Signed)
 "    Subjective:  Patient ID: [name], female    DOB: April 11, 1934, 89 y.o.   MRN: [id number]  Patient Care Team: Jolinda Norene HERO, DO as PCP - General (Family Medicine) Lavona Agent, MD as PCP - Cardiology (Cardiology) Izell Domino, MD as Attending Physician (Radiation Oncology) Lanny Callander, MD as Consulting Physician (Hematology) Tobie Gordy POUR, MD as Consulting Physician (Nephrology) [facility] [place] HERO DEVONNA as Physician Assistant (Oncology)   Chief Complaint:  ER follow up (09/19/2024 (2 hours)/[place] [facility]- Contact dermatitis)   HPI: [name] is a 89 y.o. female presenting on 09/22/2024 for ER follow up (09/19/2024 (2 hours)/[place] [facility] at [facility]- Contact dermatitis)    [name] is an 89 year old female who presents with tingling and burning sensations on her back and sides.  She experienced itching all over her back for about three days last week, which then subsided and was followed by tingling and pain on both sides of her back. She describes a specific spot on the side of her finger that was painful intermittently.  She was given pain medication in the emergency room, which she takes every six hours, with the last dose taken at 9 AM today. She also received hydroxyzine  (Atarax ) for itching, which she has been using as needed. She mentions that she has not had a rash recently, although there was a time when she experienced itching and tingling without visible rash.  No recent fever, illness, or antibiotic use. Her breathing has been okay. She recalls a previous issue with a spot on her leg that was treated by another provider. She is not currently taking B12 supplements and is unsure when her B12 levels were last checked.          Relevant past medical, surgical, family, and social history reviewed and updated as indicated.  Allergies and medications  reviewed and updated. Data reviewed: Chart in Epic.   Past Medical History:  Diagnosis Date   Anemia    Arthritis    Asthma    Atrial fibrillation (HCC)    Basal cell carcinoma 02/06/2009   Left ear targus- (MOHS)   Basal cell carcinoma 09/28/2008   Right back-([id number])   Breast cancer (HCC) 2019   invasive and ductal ca   Breast cancer, right (HCC) 04/08/2018   CKD (chronic kidney disease), stage III (HCC)    COPD (chronic obstructive pulmonary disease) (HCC)    Duodenal ulcer 10/01/2021   Essential hypertension, malignant    Heart murmur    Hyperlipidemia    Hypertension    Hyponatremia 10/14/2015   Hypothyroidism    Nodule of right lung    Right upper lobe   Osteopenia due to cancer therapy 10/07/2022   Pneumonia    PONV (postoperative nausea and vomiting)    Renal insufficiency    Chronic   Renal vascular disease    Right renal artery stenosis 05/09/2015   Squamous cell carcinoma of skin 09/07/2019   KA-Left shin-txpbx   Tear of lateral meniscus of knee 10/27/2017   UGI bleed 08/31/2021   Vertigo     Past Surgical History:  Procedure Laterality Date   BIOPSY  04/09/2021   Procedure: BIOPSY;  Surgeon: Eartha Angelia Sieving, MD;  Location: [facility];  Service: Gastroenterology;;   BREAST LUMPECTOMY WITH RADIOACTIVE SEED LOCALIZATION Right 04/08/2018   Procedure: BREAST LUMPECTOMY WITH RADIOACTIVE SEED LOCALIZATION;  Surgeon: Ebbie Cough, MD;  Location: [facility] OR;  Service: General;  Laterality: Right;   COLONOSCOPY  2016   diverticulosis, hemorrhoids   ESOPHAGOGASTRODUODENOSCOPY  2016   normal   ESOPHAGOGASTRODUODENOSCOPY (EGD) WITH PROPOFOL  N/A 04/09/2021   Procedure: ESOPHAGOGASTRODUODENOSCOPY (EGD) WITH PROPOFOL ;  Surgeon: Eartha Angelia Sieving, MD;  Location: [facility];  Service: Gastroenterology;  Laterality: N/A;  12:45   ESOPHAGOGASTRODUODENOSCOPY (EGD) WITH PROPOFOL  N/A 09/01/2021   Procedure: ESOPHAGOGASTRODUODENOSCOPY (EGD) WITH  PROPOFOL ;  Surgeon: Cindie Carlin POUR, DO;  Location: [facility];  Service: Endoscopy;  Laterality: N/A;   IR GENERIC HISTORICAL  08/29/2016   IR US  GUIDE VASC ACCESS RIGHT 08/29/2016 [facility]   IR GENERIC HISTORICAL  08/29/2016   IR RENAL BILAT S&I MOD SED 08/29/2016 [facility]   KNEE ARTHROSCOPY Right    2019   THYROIDECTOMY, PARTIAL Right 1981   middle lobe removed 1st, then right lobe removed 7-8 years later (approx. 52)    Social History   Socioeconomic History   Marital status: Widowed    Spouse name: Not on file   Number of children: 2   Years of education: 57   Highest education level: Not on file  Occupational History   Occupation: Retired  Tobacco Use   Smoking status: Never    Passive exposure: Never   Smokeless tobacco: Never  Vaping Use   Vaping status: Never Used  Substance and Sexual Activity   Alcohol  use: No   Drug use: No   Sexual activity: Not Currently  Other Topics Concern   Not on file  Social History Narrative   Lives alone, but her daughter stays there at night   Ambidextrous (writes with left hand).   No caffeine use.   Social Drivers of Health   Tobacco Use: Low Risk (09/22/2024)   Patient History    Smoking Tobacco Use: Never    Smokeless Tobacco Use: Never    Passive Exposure: Never  Financial Resource Strain: Low Risk (12/18/2023)   Overall Financial Resource Strain (CARDIA)    Difficulty of Paying Living Expenses: Not hard at all  Food Insecurity: No Food Insecurity (06/29/2024)   Epic    Worried About Programme Researcher, Broadcasting/film/video in the Last Year: Never true    Ran Out of Food in the Last Year: Never true  Transportation Needs: No Transportation Needs (06/29/2024)   Epic    Lack of Transportation (Medical): No    Lack of Transportation (Non-Medical): No  Physical Activity: Inactive (12/18/2023)   Exercise Vital Sign    Days of Exercise per Week: 0 days    Minutes of Exercise per Session: 0 min  Stress: No Stress Concern Present  (12/18/2023)   [employer] of Occupational Health - Occupational Stress Questionnaire    Feeling of Stress : Not at all  Social Connections: Moderately Integrated (06/29/2024)   Social Connection and Isolation Panel    Frequency of Communication with Friends and Family: Three times a week    Frequency of Social Gatherings with Friends and Family: Three times a week    Attends Religious Services: More than 4 times per year    Active Member of Clubs or Organizations: Yes    Attends Banker Meetings: 1 to 4 times per year    Marital Status: Widowed  Intimate Partner Violence: Not At Risk (06/29/2024)   Epic    Fear of Current or Ex-Partner: No    Emotionally Abused: No    Physically Abused: No    Sexually Abused:  No  Depression (PHQ2-9): Low Risk (09/22/2024)   Depression (PHQ2-9)    PHQ-2 Score: 0  Alcohol  Screen: Low Risk (12/18/2023)   Alcohol  Screen    Last Alcohol  Screening Score (AUDIT): 0  Housing: Low Risk (06/29/2024)   Epic    Unable to Pay for Housing in the Last Year: No    Number of Times Moved in the Last Year: 0    Homeless in the Last Year: No  Utilities: Not At Risk (06/29/2024)   Epic    Threatened with loss of utilities: No  Health Literacy: Adequate Health Literacy (12/18/2023)   B1300 Health Literacy    Frequency of need for help with medical instructions: Never    Outpatient Encounter Medications as of 09/22/2024  Medication Sig   atorvastatin  (LIPITOR) 20 MG tablet TAKE 1/2 TABLET BY MOUTH DAILY   Azelastine  HCl 137 MCG/SPRAY SOLN PLACE 1 SPRAY INTO BOTH NOSTRILS 2 (TWO) TIMES DAILY. FOR RUNNY NOSE   bisoprolol  (ZEBETA ) 5 MG tablet TAKE 1 TABLET (5 MG TOTAL) BY MOUTH DAILY.   budesonide -glycopyrrolate -formoterol  (BREZTRI  AEROSPHERE) 160-9-4.8 MCG/ACT AERO inhaler Inhale 2 puffs into the lungs 2 (two) times daily.   Cholecalciferol  (VITAMIN D -3 PO) Take 1 capsule by mouth daily.   cloNIDine  (CATAPRES ) 0.1 MG tablet Take 1 tablet (0.1 mg total) by  mouth as directed. Take 1 tablet (0.1 mg total) by mouth daily as needed for systolic blood pressure greater than 180.   feeding supplement (ENSURE ENLIVE / ENSURE PLUS) LIQD Take 237 mLs by mouth 2 (two) times daily between meals. (Patient taking differently: Take 237 mLs by mouth daily as needed (decreased appetite).)   ferrous sulfate  325 (65 FE) MG tablet Take 1 tablet (325 mg total) by mouth daily with breakfast.   flecainide  (TAMBOCOR ) 50 MG tablet TAKE 1 TABLET BY MOUTH TWICE A DAY   Fluticasone -Umeclidin-Vilant (TRELEGY ELLIPTA ) 100-62.5-25 MCG/ACT AEPB Inhale 1 puff into the lungs daily.   hydrALAZINE  (APRESOLINE ) 50 MG tablet Take 1 tablet (50 mg total) by mouth 3 (three) times daily. **SIG change   hydrOXYzine  (ATARAX ) 25 MG tablet Take 1 tablet (25 mg total) by mouth every 6 (six) hours as needed. For itching   levothyroxine  (SYNTHROID ) 75 MCG tablet TAKE 1 TABLET BY MOUTH EVERY DAY   mirtazapine  (REMERON ) 7.5 MG tablet TAKE 1 TABLET BY MOUTH EVERYDAY AT BEDTIME   Multiple Vitamins-Minerals (MULTIVITAMIN WOMEN 50+) TABS Take 1 tablet by mouth at bedtime.   mupirocin  cream (BACTROBAN ) 2 % Apply 1 Application topically 2 (two) times daily for 7 days. Or until wound on knee is healed   pantoprazole  (PROTONIX ) 40 MG tablet TAKE 1 TABLET BY MOUTH EVERY DAY   torsemide  (DEMADEX ) 20 MG tablet TAKE 1 TABLET BY MOUTH TWICE A DAY   triamcinolone  cream (KENALOG ) 0.1 % Apply 1 Application topically 2 (two) times daily.   No facility-administered encounter medications on file as of 09/22/2024.    Allergies[1]  Pertinent ROS per HPI, otherwise unremarkable      Objective:  BP (!) 199/38   Pulse (!) 56   Temp 98.1 F (36.7 C)   Ht 5' 5 (1.651 m)   Wt 110 lb (49.9 kg)   SpO2 91%   BMI 18.30 kg/m    Wt Readings from Last 3 Encounters:  09/22/24 110 lb (49.9 kg)  09/19/24 111 lb 6 oz (50.5 kg)  09/16/24 111 lb 6 oz (50.5 kg)    Physical Exam Vitals and nursing note reviewed.   Constitutional:  General: She is not in acute distress.    Appearance: She is ill-appearing (chronically ill, frail elderly). She is not toxic-appearing or diaphoretic.  HENT:     Head: Normocephalic and atraumatic.     Mouth/Throat:     Mouth: Mucous membranes are moist.  Eyes:     Pupils: Pupils are equal, round, and reactive to light.  Cardiovascular:     Rate and Rhythm: Normal rate. Rhythm irregularly irregular.     Heart sounds: Murmur heard.     Systolic murmur is present with a grade of 2/6.  Pulmonary:     Effort: Pulmonary effort is normal.     Breath sounds: Decreased breath sounds present. No wheezing or rhonchi.     Comments: O2 via [place] Musculoskeletal:     Cervical back: Neck supple.  Skin:    General: Skin is warm.     Capillary Refill: Capillary refill takes less than 2 seconds.     Coloration: Skin is pale.     Findings: No erythema or rash.  Neurological:     General: No focal deficit present.     Mental Status: She is alert and oriented to person, place, and time.  Psychiatric:        Mood and Affect: Mood normal.        Behavior: Behavior normal.        Thought Content: Thought content normal.        Cognition and Memory: Memory is impaired.        Judgment: Judgment normal.       Results for orders placed or performed during the hospital encounter of 09/19/24  CBC   Collection Time: 09/19/24  1:16 PM  Result Value Ref Range   WBC 3.1 (L) 4.0 - 10.5 K/uL   RBC 3.58 (L) 3.87 - 5.11 MIL/uL   Hemoglobin 9.5 (L) 12.0 - 15.0 g/dL   HCT 69.3 (L) 63.9 - 53.9 %   MCV 85.5 80.0 - 100.0 fL   MCH 26.5 26.0 - 34.0 pg   MCHC 31.0 30.0 - 36.0 g/dL   RDW 83.7 (H) 88.4 - 84.4 %   Platelets 118 (L) 150 - 400 K/uL   nRBC 0.0 0.0 - 0.2 %  Comprehensive metabolic panel   Collection Time: 09/19/24  1:16 PM  Result Value Ref Range   Sodium 136 135 - 145 mmol/L   Potassium 3.9 3.5 - 5.1 mmol/L   Chloride 91 (L) 98 - 111 mmol/L   CO2 37 (H) 22 - 32 mmol/L    Glucose, Bld 110 (H) 70 - 99 mg/dL   BUN 29 (H) 8 - 23 mg/dL   Creatinine, Ser 8.34 (H) 0.44 - 1.00 mg/dL   Calcium  9.6 8.9 - 10.3 mg/dL   Total Protein 7.2 6.5 - 8.1 g/dL   Albumin  4.3 3.5 - 5.0 g/dL   AST 24 15 - 41 U/L   ALT 8 0 - 44 U/L   Alkaline Phosphatase 53 38 - 126 U/L   Total Bilirubin 0.6 0.0 - 1.2 mg/dL   GFR, Estimated 30 (L) >60 mL/min   Anion gap 8 5 - 15   *Note: Due to a large number of results and/or encounters for the requested time period, some results have not been displayed. A complete set of results can be found in Results Review.       Pertinent labs & imaging results that were available during my care of the patient were reviewed by me and considered  in my medical decision making.  Assessment & Plan:  Nekesha was seen today for er follow up.  Diagnoses and all orders for this visit:  Pruritus      Pruritus and neuropathic pain of the back Pruritus and neuropathic pain in the back, characterized by itching, tingling, and burning sensations. Symptoms began last Saturday. No current rash, but previous rash was not clearly visualized. Differential diagnosis includes shingles. No fever or recent illness. Hydroxyzine  prescribed in the ER for itching, but not yet used. Triamcinolone  cream prescribed. B12 deficiency considered as a potential cause of neuropathic symptoms. - Administered Depo-Medrol  injection for inflammation and pain relief. - Recommended mixing triamcinolone  cream with Equate eczema cream (2% colloid oatmeal) and applying twice daily to the back. - Advised use of hydroxyzine  (Atarax ) only if significant itching occurs, cautioning about potential drowsiness. - Instructed to pick up Equate eczema cream from pharmacy.          Continue all other maintenance medications.  Follow up plan: Return if symptoms worsen or fail to improve.   Continue healthy lifestyle choices, including diet (rich in fruits, vegetables, and lean proteins, and low in  salt and simple carbohydrates) and exercise (at least 30 minutes of moderate physical activity daily).   The above assessment and management plan was discussed with the patient. The patient verbalized understanding of and has agreed to the management plan. Patient is aware to call the clinic if they develop any new symptoms or if symptoms persist or worsen. Patient is aware when to return to the clinic for a follow-up visit. Patient educated on when it is appropriate to go to the emergency department.   Rosaline Bruns, FNP-C [facility] [phone]     [1]  Allergies Allergen Reactions   Norvasc  [Amlodipine ] Swelling    Edema    Tape Other (See Comments)    Has to have no stick tape.   Zestril  [Lisinopril ] Cough   Hytrin  [Terazosin ] Hives and Nausea Only   Imdur  [Isosorbide  Nitrate] Nausea Only   Sulfonamide Derivatives Nausea And Vomiting and Rash   "

## 2024-09-22 NOTE — Telephone Encounter (Signed)
 Appointment scheduled.

## 2024-09-22 NOTE — Patient Instructions (Addendum)
 Equate Eczema Nervive

## 2024-09-27 ENCOUNTER — Other Ambulatory Visit

## 2024-09-28 ENCOUNTER — Inpatient Hospital Stay

## 2024-09-28 ENCOUNTER — Inpatient Hospital Stay: Admitting: Physician Assistant

## 2024-10-07 ENCOUNTER — Telehealth: Admitting: *Deleted

## 2024-10-18 ENCOUNTER — Ambulatory Visit: Payer: Self-pay | Admitting: Family Medicine

## 2024-12-07 ENCOUNTER — Ambulatory Visit (HOSPITAL_BASED_OUTPATIENT_CLINIC_OR_DEPARTMENT_OTHER)

## 2024-12-19 ENCOUNTER — Ambulatory Visit: Payer: Self-pay

## 2024-12-27 ENCOUNTER — Ambulatory Visit

## 2025-02-22 ENCOUNTER — Encounter: Payer: Self-pay | Admitting: Family Medicine
